# Patient Record
Sex: Male | Born: 1955 | Race: White | Hispanic: No | Smoking: Current every day smoker
Health system: Southern US, Community
[De-identification: ages and names within clinical notes are randomized; demographics above are authoritative.]

## PROBLEM LIST (undated history)

## (undated) ENCOUNTER — Emergency Department (HOSPITAL_COMMUNITY): Admission: EM | Payer: Medicare Other | Source: Home / Self Care

## (undated) DIAGNOSIS — I219 Acute myocardial infarction, unspecified: Secondary | ICD-10-CM

## (undated) DIAGNOSIS — R55 Syncope and collapse: Secondary | ICD-10-CM

## (undated) DIAGNOSIS — Q613 Polycystic kidney, unspecified: Secondary | ICD-10-CM

## (undated) DIAGNOSIS — N186 End stage renal disease: Secondary | ICD-10-CM

## (undated) DIAGNOSIS — I739 Peripheral vascular disease, unspecified: Secondary | ICD-10-CM

## (undated) DIAGNOSIS — Z87898 Personal history of other specified conditions: Secondary | ICD-10-CM

## (undated) DIAGNOSIS — F419 Anxiety disorder, unspecified: Secondary | ICD-10-CM

## (undated) DIAGNOSIS — J189 Pneumonia, unspecified organism: Secondary | ICD-10-CM

## (undated) DIAGNOSIS — I4891 Unspecified atrial fibrillation: Secondary | ICD-10-CM

## (undated) DIAGNOSIS — R253 Fasciculation: Secondary | ICD-10-CM

## (undated) DIAGNOSIS — K219 Gastro-esophageal reflux disease without esophagitis: Secondary | ICD-10-CM

## (undated) DIAGNOSIS — R4689 Other symptoms and signs involving appearance and behavior: Secondary | ICD-10-CM

## (undated) DIAGNOSIS — I2510 Atherosclerotic heart disease of native coronary artery without angina pectoris: Secondary | ICD-10-CM

## (undated) DIAGNOSIS — Z992 Dependence on renal dialysis: Secondary | ICD-10-CM

## (undated) DIAGNOSIS — K5792 Diverticulitis of intestine, part unspecified, without perforation or abscess without bleeding: Secondary | ICD-10-CM

## (undated) DIAGNOSIS — E785 Hyperlipidemia, unspecified: Secondary | ICD-10-CM

## (undated) DIAGNOSIS — J449 Chronic obstructive pulmonary disease, unspecified: Secondary | ICD-10-CM

## (undated) DIAGNOSIS — G8929 Other chronic pain: Secondary | ICD-10-CM

## (undated) DIAGNOSIS — M1990 Unspecified osteoarthritis, unspecified site: Secondary | ICD-10-CM

## (undated) DIAGNOSIS — I4719 Other supraventricular tachycardia: Secondary | ICD-10-CM

## (undated) DIAGNOSIS — B1920 Unspecified viral hepatitis C without hepatic coma: Secondary | ICD-10-CM

## (undated) DIAGNOSIS — I10 Essential (primary) hypertension: Secondary | ICD-10-CM

## (undated) DIAGNOSIS — Z87442 Personal history of urinary calculi: Secondary | ICD-10-CM

## (undated) DIAGNOSIS — I471 Supraventricular tachycardia: Secondary | ICD-10-CM

## (undated) DIAGNOSIS — M549 Dorsalgia, unspecified: Secondary | ICD-10-CM

## (undated) HISTORY — DX: Syncope and collapse: R55

## (undated) HISTORY — PX: BUNIONECTOMY: SHX129

## (undated) HISTORY — PX: BACK SURGERY: SHX140

## (undated) HISTORY — PX: POSTERIOR FUSION LUMBAR SPINE: SUR632

## (undated) HISTORY — PX: ANTERIOR CERVICAL DECOMP/DISCECTOMY FUSION: SHX1161

## (undated) HISTORY — DX: Diverticulitis of intestine, part unspecified, without perforation or abscess without bleeding: K57.92

## (undated) HISTORY — DX: Gastro-esophageal reflux disease without esophagitis: K21.9

## (undated) HISTORY — DX: Supraventricular tachycardia: I47.1

## (undated) HISTORY — DX: Other supraventricular tachycardia: I47.19

## (undated) HISTORY — DX: Polycystic kidney, unspecified: Q61.3

---
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
## 2000-10-22 ENCOUNTER — Inpatient Hospital Stay (HOSPITAL_COMMUNITY): Admission: AD | Admit: 2000-10-22 | Discharge: 2000-10-24 | Payer: Self-pay | Admitting: Cardiology

## 2000-10-24 ENCOUNTER — Encounter: Payer: Self-pay | Admitting: Cardiology

## 2006-04-14 HISTORY — PX: ESOPHAGOGASTRODUODENOSCOPY: SHX1529

## 2006-04-14 HISTORY — PX: COLONOSCOPY: SHX174

## 2006-11-12 ENCOUNTER — Ambulatory Visit: Payer: Self-pay | Admitting: Gastroenterology

## 2006-11-24 ENCOUNTER — Encounter: Payer: Self-pay | Admitting: Gastroenterology

## 2006-11-24 ENCOUNTER — Ambulatory Visit (HOSPITAL_COMMUNITY): Admission: RE | Admit: 2006-11-24 | Discharge: 2006-11-24 | Payer: Self-pay | Admitting: Gastroenterology

## 2006-11-24 ENCOUNTER — Ambulatory Visit: Payer: Self-pay | Admitting: Gastroenterology

## 2008-03-12 ENCOUNTER — Emergency Department (HOSPITAL_COMMUNITY): Admission: EM | Admit: 2008-03-12 | Discharge: 2008-03-12 | Payer: Self-pay | Admitting: Emergency Medicine

## 2008-05-01 ENCOUNTER — Emergency Department (HOSPITAL_COMMUNITY): Admission: EM | Admit: 2008-05-01 | Discharge: 2008-05-02 | Payer: Self-pay | Admitting: Emergency Medicine

## 2008-08-24 ENCOUNTER — Encounter: Payer: Self-pay | Admitting: Physician Assistant

## 2008-08-25 ENCOUNTER — Ambulatory Visit: Payer: Self-pay | Admitting: Cardiology

## 2008-08-25 ENCOUNTER — Encounter: Payer: Self-pay | Admitting: Physician Assistant

## 2008-08-25 ENCOUNTER — Inpatient Hospital Stay (HOSPITAL_COMMUNITY): Admission: EM | Admit: 2008-08-25 | Discharge: 2008-08-28 | Payer: Self-pay | Admitting: Cardiology

## 2008-08-28 ENCOUNTER — Encounter: Payer: Self-pay | Admitting: Physician Assistant

## 2008-09-29 ENCOUNTER — Encounter: Payer: Self-pay | Admitting: Physician Assistant

## 2009-04-17 ENCOUNTER — Encounter: Payer: Self-pay | Admitting: Cardiology

## 2009-09-14 ENCOUNTER — Encounter: Payer: Self-pay | Admitting: Physician Assistant

## 2009-09-15 ENCOUNTER — Encounter: Payer: Self-pay | Admitting: Physician Assistant

## 2009-09-17 ENCOUNTER — Encounter: Payer: Self-pay | Admitting: Physician Assistant

## 2009-09-17 ENCOUNTER — Ambulatory Visit: Payer: Self-pay | Admitting: Cardiology

## 2009-09-18 ENCOUNTER — Encounter: Payer: Self-pay | Admitting: Physician Assistant

## 2009-10-04 ENCOUNTER — Encounter: Payer: Self-pay | Admitting: Physician Assistant

## 2009-10-04 DIAGNOSIS — R072 Precordial pain: Secondary | ICD-10-CM

## 2009-10-04 DIAGNOSIS — F341 Dysthymic disorder: Secondary | ICD-10-CM

## 2009-10-04 DIAGNOSIS — D509 Iron deficiency anemia, unspecified: Secondary | ICD-10-CM

## 2009-10-04 DIAGNOSIS — I10 Essential (primary) hypertension: Secondary | ICD-10-CM | POA: Insufficient documentation

## 2009-10-04 DIAGNOSIS — K219 Gastro-esophageal reflux disease without esophagitis: Secondary | ICD-10-CM | POA: Insufficient documentation

## 2009-10-04 DIAGNOSIS — N179 Acute kidney failure, unspecified: Secondary | ICD-10-CM

## 2009-10-04 DIAGNOSIS — N189 Chronic kidney disease, unspecified: Secondary | ICD-10-CM

## 2009-10-10 ENCOUNTER — Encounter: Payer: Self-pay | Admitting: Cardiology

## 2009-10-27 ENCOUNTER — Encounter: Payer: Self-pay | Admitting: Cardiology

## 2010-03-03 ENCOUNTER — Emergency Department (HOSPITAL_COMMUNITY): Admission: EM | Admit: 2010-03-03 | Discharge: 2010-03-03 | Payer: Self-pay | Admitting: Emergency Medicine

## 2010-03-03 ENCOUNTER — Encounter: Payer: Self-pay | Admitting: Cardiology

## 2010-05-01 ENCOUNTER — Encounter: Payer: Self-pay | Admitting: Cardiology

## 2010-05-06 ENCOUNTER — Encounter: Payer: Self-pay | Admitting: Cardiology

## 2010-05-06 ENCOUNTER — Encounter: Payer: Self-pay | Admitting: Physician Assistant

## 2010-05-07 ENCOUNTER — Encounter: Payer: Self-pay | Admitting: Cardiology

## 2010-05-07 DIAGNOSIS — R55 Syncope and collapse: Secondary | ICD-10-CM

## 2010-05-07 HISTORY — DX: Syncope and collapse: R55

## 2010-05-08 ENCOUNTER — Encounter: Payer: Self-pay | Admitting: Cardiology

## 2010-05-13 ENCOUNTER — Telehealth (INDEPENDENT_AMBULATORY_CARE_PROVIDER_SITE_OTHER): Payer: Self-pay | Admitting: *Deleted

## 2010-05-14 NOTE — Consult Note (Signed)
Summary: CARDIOLOGY CONSULT/ [facility]/ [place]   Imported By: Delfino Lovett 10/04/2009 11:41:44  _____________________________________________________________________  External Attachment:    Type:   Image     Comment:   External Document

## 2010-05-14 NOTE — Letter (Signed)
Summary: [facility] D/C DR.MARGARET CAMPBELL  [facility] D/C DR.MARGARET CAMPBELL   Imported By: Delfino Lovett 10/04/2009 11:42:44  _____________________________________________________________________  External Attachment:    Type:   Image     Comment:   External Document

## 2010-05-14 NOTE — Cardiovascular Report (Signed)
Summary: Cardiac Catheterization  Cardiac Catheterization   Imported By: Bartholomew Boards 10/04/2009 11:43:22  _____________________________________________________________________  External Attachment:    Type:   Image     Comment:   External Document

## 2010-05-16 NOTE — Miscellaneous (Signed)
Summary: Orders Update  Clinical Lists Changes  Problems: Added new problem of SYNCOPE (ICD-780.2) Orders: Added new Referral order of Cardionet/Event Monitor (Cardionet/Event) - Signed

## 2010-05-22 DIAGNOSIS — R0789 Other chest pain: Secondary | ICD-10-CM

## 2010-05-22 NOTE — Consult Note (Signed)
Summary: Consultation Report/ CARDIOLOGY  Consultation Report/ CARDIOLOGY   Imported By: Bartholomew Boards 05/16/2010 10:21:19  _____________________________________________________________________  External Attachment:    Type:   Image     Comment:   External Document

## 2010-05-22 NOTE — Progress Notes (Signed)
Summary: Monitor  Phone Note Other Incoming Call back at [phone]   Summary of Call: Pt left message on voicemail asking for a return call regarding monitor. He wants to make sure we have his correct address.   Left message to call back on voicemail. Initial call taken by: Gurney Maxin, RN, BSN,  May 13, 2010 3:17 PM Call placed by: Gurney Maxin, RN, BSN,  May 13, 2010 3:16 PM  Follow-up for Phone Call        Pt returned call and gave address of [place]. Home number is [phone]. Follow-up by: Gurney Maxin, RN, BSN,  May 13, 2010 4:51 PM

## 2010-06-03 ENCOUNTER — Encounter: Payer: Self-pay | Admitting: Cardiology

## 2010-06-12 ENCOUNTER — Encounter: Payer: Self-pay | Admitting: Cardiology

## 2010-06-20 NOTE — Letter (Signed)
Summary: [facility] D/C DR. MOHAMMAD ANWAR  [facility] D/C DR. MOHAMMAD ANWAR   Imported By: Delfino Lovett 06/12/2010 10:19:13  _____________________________________________________________________  External Attachment:    Type:   Image     Comment:   External Document

## 2010-06-20 NOTE — Consult Note (Signed)
Summary: CARDIOLOGY CONSULT/ [facility]/ [place]   Imported By: Delfino Lovett 06/12/2010 10:10:20  _____________________________________________________________________  External Attachment:    Type:   Image     Comment:   External Document

## 2010-06-20 NOTE — Medication Information (Signed)
Summary: [facility] D/C MEDICATION SHEET  [place] D/C MEDICATION SHEET   Imported By: Delfino Lovett 06/12/2010 10:18:48  _____________________________________________________________________  External Attachment:    Type:   Image     Comment:   External Document

## 2010-06-25 LAB — COMPREHENSIVE METABOLIC PANEL
ALT: 10 U/L (ref 0–53)
Alkaline Phosphatase: 90 U/L (ref 39–117)
CO2: 27 mEq/L (ref 19–32)
Chloride: 99 mEq/L (ref 96–112)
GFR calc non Af Amer: 56 mL/min — ABNORMAL LOW (ref >60)
Glucose, Bld: 111 mg/dL — ABNORMAL HIGH (ref 70–99)
Potassium: 3.9 mEq/L (ref 3.5–5.1)
Sodium: 133 mEq/L — ABNORMAL LOW (ref 135–145)
Total Bilirubin: 0.6 mg/dL (ref 0.3–1.2)
Total Protein: 7.4 g/dL (ref 6.0–8.3)

## 2010-06-25 LAB — URINALYSIS, ROUTINE W REFLEX MICROSCOPIC
Glucose, UA: NEGATIVE mg/dL
Ketones, ur: NEGATIVE mg/dL
Leukocytes, UA: NEGATIVE
Protein, ur: 30 mg/dL — AB

## 2010-06-25 LAB — DIFFERENTIAL
Basophils Absolute: 0 10*3/uL (ref 0.0–0.1)
Basophils Relative: 0 % (ref 0–1)
Eosinophils Absolute: 0 10*3/uL (ref 0.0–0.7)
Monocytes Relative: 3 % (ref 3–12)
Neutrophils Relative %: 88 % — ABNORMAL HIGH (ref 43–77)

## 2010-06-25 LAB — CBC
HCT: 46.8 % (ref 39.0–52.0)
Hemoglobin: 15.4 g/dL (ref 13.0–17.0)
MCV: 97.9 fL (ref 78.0–100.0)
RBC: 4.78 MIL/uL (ref 4.22–5.81)
WBC: 10.1 10*3/uL (ref 4.0–10.5)

## 2010-06-30 ENCOUNTER — Emergency Department (HOSPITAL_COMMUNITY)
Admission: EM | Admit: 2010-06-30 | Discharge: 2010-06-30 | Disposition: A | Discharge status: Home / Self Care | Payer: Medicaid Other | Attending: Emergency Medicine | Admitting: Emergency Medicine

## 2010-06-30 DIAGNOSIS — G8929 Other chronic pain: Secondary | ICD-10-CM | POA: Insufficient documentation

## 2010-06-30 DIAGNOSIS — M545 Low back pain, unspecified: Secondary | ICD-10-CM | POA: Insufficient documentation

## 2010-06-30 DIAGNOSIS — Z760 Encounter for issue of repeat prescription: Secondary | ICD-10-CM | POA: Insufficient documentation

## 2010-06-30 DIAGNOSIS — I10 Essential (primary) hypertension: Secondary | ICD-10-CM | POA: Insufficient documentation

## 2010-06-30 DIAGNOSIS — R51 Headache: Secondary | ICD-10-CM | POA: Insufficient documentation

## 2010-06-30 LAB — BASIC METABOLIC PANEL
CO2: 27 mEq/L (ref 19–32)
Chloride: 101 mEq/L (ref 96–112)
GFR calc Af Amer: >60 mL/min (ref >60)
Sodium: 137 mEq/L (ref 135–145)

## 2010-07-02 NOTE — Procedures (Signed)
Summary: [facility]/  [facility]  [facility]/  [facility]   Imported By: Bartholomew Boards 06/24/2010 11:05:50  _____________________________________________________________________  External Attachment:    Type:   Image     Comment:   External Document

## 2010-07-23 LAB — BASIC METABOLIC PANEL
BUN: 12 mg/dL (ref 6–23)
BUN: 10 mg/dL (ref 6–23)
CO2: 32 mEq/L (ref 19–32)
CO2: 28 mEq/L (ref 19–32)
Chloride: 98 mEq/L (ref 96–112)
Chloride: 99 mEq/L (ref 96–112)
Creatinine, Ser: 1.52 mg/dL — ABNORMAL HIGH (ref 0.4–1.5)
Creatinine, Ser: 1.17 mg/dL (ref 0.4–1.5)
GFR calc Af Amer: >60 mL/min (ref >60)

## 2010-07-23 LAB — CBC
HCT: 42.4 % (ref 39.0–52.0)
Hemoglobin: 14.1 g/dL (ref 13.0–17.0)
MCHC: 34.2 g/dL (ref 30.0–36.0)
MCHC: 34.5 g/dL (ref 30.0–36.0)
MCHC: 33.9 g/dL (ref 30.0–36.0)
MCV: 100.3 fL — ABNORMAL HIGH (ref 78.0–100.0)
MCV: 100.5 fL — ABNORMAL HIGH (ref 78.0–100.0)
MCV: 100.8 fL — ABNORMAL HIGH (ref 78.0–100.0)
Platelets: 194 10*3/uL (ref 150–400)
RBC: 4.09 MIL/uL — ABNORMAL LOW (ref 4.22–5.81)
RBC: 4.21 MIL/uL — ABNORMAL LOW (ref 4.22–5.81)

## 2010-07-23 LAB — HEPARIN LEVEL (UNFRACTIONATED)
Heparin Unfractionated: 0.36 IU/mL (ref 0.30–0.70)
Heparin Unfractionated: 0.74 IU/mL — ABNORMAL HIGH (ref 0.30–0.70)

## 2010-08-27 NOTE — Consult Note (Signed)
Aaron [name], Aaron [name]                ACCOUNT NO.:  [id number]   MEDICAL RECORD NO.:  [id number]          PATIENT TYPE:  AMB   LOCATION:  DAY                           FACILITY:  [facility]   PHYSICIAN:  Caro Hight, M.D.      DATE OF BIRTH:  07/10/1954   DATE OF CONSULTATION:  11/12/2006  DATE OF DISCHARGE:                                 CONSULTATION   REFERRING PHYSICIAN:  Sherrilee Gilles. Gerarda Fraction, M.D. at the free clinic of  [facility], [address] [place], [place]  [address].   REASON FOR CONSULTATION:  Abdominal pain and weight loss.   HISTORY OF PRESENT ILLNESS:  [name] is a 55 year old male who states  his stomach has not been quite right since June.  In June 2008, he  presented to [facility] with right lower quadrant pain  of acute onset.  CT scan was performed which revealed left renal stones,  bladder stones, and mild diverticulitis in the distal descending colon.  He also had wall thickening which was minimal over the ascending colon  and cecum as well as the terminal ileum.  His stool culture from October 14, 2006 was negative.  He also had a white count of 7.6, hemoglobin of  16.6, BUN of 19 and creatinine 1.30.  He was discharged on Cipro and  metronidazole and symptoms improved.  He continues to have left lower  quadrant pain.  He feels run down.  He is still nauseated and  complains that he is losing weight.  He has never had an upper endoscopy  or a colonoscopy.  He vomits one to two times a week.  When he eats  stuff it gets hung up in his chest.  He feels this sensation with  solids and liquids.  He is avoiding pork and fried foods.  He is seeing  strings of blood in stool since June 2008.  He also complains of black  tarry stool approximately 1 week ago.  He is having 1 or 2 bowel  movements a day which are soft or watery.  He denies any constipation or  pain with swallowing.  His heartburn and indigestion are controlled with  Zantac  twice a day if he takes it before he eats.   PAST MEDICAL HISTORY.:  1. Polycystic kidney disease.  2. Hypertension.  3. Emphysema.  4. Depression.   PAST SURGICAL HISTORY:  Bunionectomy.   ALLERGIES:  CODEINE.   MEDICATIONS:  1. Trazodone 100 mg nightly.  2. Dicyclomine 10 mg three times a day.  3. Citalopram 20 mg daily.  4. Triamterene/hydrochlorothiazide 37.5/25 daily.  5. Hydrocodone 5/500 every 4 hours as needed.  6. Atenolol 50 mg daily.  7. Ranitidine 150 mg b.i.d.  8. Combivent three times a day.   FAMILY HISTORY:  His grandfather had colon cancer in his 51s.  He had  two cousins with kidney cancer.  His sisters had male cancer.   SOCIAL HISTORY:  He is separated.  He is a [employer] manager.  He smokes but does  not drink alcohol or use any  recreational drugs.   REVIEW OF SYSTEMS:  As per the HPI, otherwise all systems are negative.   PHYSICAL EXAMINATION:  Weight 169 pounds, height 6 feet 4 inches.  BMI  20.6 (healthy).  Temperature 98, blood pressure 130/90, pulse 56.  GENERAL: He is in no apparent distress, alert and orient x4.  HEENT:  Atraumatic, normocephalic.  Pupils equal and reactive to light.  Mouth:  No oral lesions.  Posterior pharynx without erythema or exudate.  NECK:  Full range of motion and no lymphadenopathy.  LUNGS:  Clear to auscultation bilaterally.  CARDIOVASCULAR:  Regular rhythm.  No murmur.  Normal S1 and S2.  ABDOMEN: Bowel sounds are present, soft, nondistended, mild tenderness  to palpation in the left lower quadrant without rebound or guarding.  NEURO:  He has no focal neurologic deficits.   ASSESSMENT:  [name] is a 55 year old male who presented with his first  episode of diverticulitis.  He continues to complain of blood in the  stool and pain in his left lower quadrant.  The differential diagnosis  includes resolved diverticulitis with functional gut disorder and colon  polyp, or colon cancer.   He also has a history of  gastroesophageal reflux disease which he states  is controlled.  He is having difficulty swallowing.  Differential  diagnosis includes peptic stricture, secondary esophageal motility  disorder due to silent acid exposure in the esophagus, and a low  likelihood of esophageal malignancy or gastric malignancy.   Thank you for allowing me to see [name] in consultation.  My  recommendations follow.   RECOMMENDATIONS:  1. [name] will be scheduled for a colonoscopy to evaluate his      descending colon, ascending colon and cecum.  He is given a      NuLYTELY bowel prep.  He also will have an upper endoscopy      performed on that date to evaluate for peptic stricture or upper      gastrointestinal malignancy.  2. He should continue to take Zantac twice daily for his      gastroesophageal reflux disease.  3. He will follow up with me as needed.      Caro Hight, M.D.  Electronically Signed     SM/MEDQ  D:  11/12/2006  T:  11/13/2006  Job:  [id number]

## 2010-08-27 NOTE — Discharge Summary (Signed)
Aaron [name], Aaron [name]                ACCOUNT NO.:  [id number]   MEDICAL RECORD NO.:  [id number]          PATIENT TYPE:  INP   LOCATION:  2021                         FACILITY:  [facility]   PHYSICIAN:  Bruce R. Olevia Perches, MD, FACCDATE OF BIRTH:  07/10/1954   DATE OF ADMISSION:  08/25/2008  DATE OF DISCHARGE:  08/28/2008                               DISCHARGE SUMMARY   ADDENDUM   PRIMARY CARE PHYSICIAN:  Herbie Baltimore Day, MD   The eGFR was equal to 48 and not 58 as previously recorded and please  note the patient has another appointment for a lab draw on Sep 06, 2008,  at the [facility], secondary to increasing creatinine.  Also, the  patient has an appointment with Dr. Herbie Baltimore Day on September 28, 2008.      Guss Bunde, PAC      Bruce R. Olevia Perches, MD, [facility]  Electronically Signed    MS/MEDQ  D:  08/28/2008  T:  08/29/2008  Job:  [id number]   cc:   Genevie Ann, MD  Ernestine Mcmurray, MD,FACC

## 2010-08-27 NOTE — Op Note (Signed)
Aaron [name], Aaron [name]                ACCOUNT NO.:  [id number]   MEDICAL RECORD NO.:  [id number]          PATIENT TYPE:  AMB   LOCATION:  DAY                           FACILITY:  [facility]   PHYSICIAN:  Caro Hight, M.D.      DATE OF BIRTH:  07/10/1954   DATE OF PROCEDURE:  11/24/2006  DATE OF DISCHARGE:                               OPERATIVE REPORT   PROCEDURE:  1. Colonoscopy.  2. Esophagogastroduodenoscopy with cold forceps biopsy.   INDICATION FOR EXAM:  Mr. Hams is a 55 year old male who presents with  abdominal pain in the right lower quadrant.  He had a CT scan which  showed minimal thickening of the ascending colon, cecum as well as  terminal ileum.  He was treated with Cipro and metronidazole.  He is  also complaining of vomiting and difficulty swallowing.  He complained  of black tarry stools well.   FINDINGS:  1. Rare sigmoid colon diverticulosis.  Internal hemorrhoids.      Otherwise no polyps, masses, inflammatory changes or arteriovenous      malformations seen.  2. Normal esophagus without evidence of Barrett's erosions,      ulcerations or strictures.  3. Multiple antral erosions and a single superficial ulceration seen      in the antrum.  Biopsies obtained via cold forceps to evaluate for      H pylori gastritis.  4. Erosions and ulcerations seen in the duodenal bulb at the junction      of D1 and D2 as well as into D2.   DIAGNOSIS:  1. Antritis and duodenitis  2. Mild diverticulosis   RECOMMENDATIONS:  1. He should follow high fiber diet and avoid gastric irritants.  He      is given handout on high-fiber diet, diverticulosis, hemorrhoids      and gastric irritants as well as gastritis.  2. No aspirin or NSAIDs for at least 3 months.  3. Screening colonoscopy in 10 years with Propofol.  4. He should stop the Zantac and begin Prilosec over-the-counter      daily.  5. Will call Mr. Mazzola with results of his biopsies.   MEDICATIONS:  1. Demerol 175 mg IV.  2.  Versed 10 mg IV.  3. Phenergan 25 mg IV.   PROCEDURE TECHNIQUE:  Physical exam was performed.  Informed consent was  obtained from the patient after explaining benefits, risks and  alternatives to the procedure.  The patient connected to monitor and  placed in left lateral position.  Continuous oxygen was provided by  nasal cannula and IV medicine administered through an indwelling  cannula.  After administration of sedation, and rectal exam, the  patient's rectum intubated.  Scope was advanced under direct  visualization to the cecum.  The scope was removed slowly by carefully  examining the color, texture, anatomy and integrity of mucosa on the way  out.   After the colonoscopy, the patient's esophagus was intubated with  diagnostic gastroscope and advanced under direct visualization to the  second portion of duodenum.  The scope was removed slowly by carefully  examining the color, texture, anatomy and integrity of mucosa on the way  out.  The patient was recovered in endoscopy and discharged home in  satisfactory condition.      Caro Hight, M.D.  Electronically Signed     SM/MEDQ  D:  11/24/2006  T:  11/25/2006  Job:  [id number]   cc:   Sherrilee Gilles. Gerarda Fraction, MD  Fax: [phone]

## 2010-08-27 NOTE — Discharge Summary (Signed)
Aaron [name], Aaron [name]                ACCOUNT NO.:  [id number]   MEDICAL RECORD NO.:  [id number]          PATIENT TYPE:  INP   LOCATION:  2021                         FACILITY:  [facility]   PHYSICIAN:  Bruce R. Olevia Perches, MD, FACCDATE OF BIRTH:  07/10/1954   DATE OF ADMISSION:  08/25/2008  DATE OF DISCHARGE:  08/28/2008                               DISCHARGE SUMMARY   PRIMARY CARDIOLOGIST:  Ernestine Mcmurray, MD, [facility]   DISCHARGE DIAGNOSES:  1. Noncardiac chest pain.  2. Labile hypertension.  3. Renal insufficiency (glomerular filtration rate equal to 58 on date      of discharge).   SECONDARY DIAGNOSES:  1. Chronic obstructive pulmonary disease.  2. Dyslipidemia.  3. Polycystic kidney disease.  4. History of polysubstance abuse (cocaine, heroine, and alcohol).  5. Gastroesophageal reflux disease.  6. Ongoing tobacco abuse disorder (2-3 packs per day).   ALLERGIES:  CODEINE.   PROCEDURES PERFORMED DURING THIS HOSPITALIZATION:  1. EKG performed on Aug 26, 2008, showing no acute ST-T wave changes.      No significant Q waves, delayed R wave progression with minimal R      waves in V3 and V4, slight left axis deviation, left ventricular      hypertrophy, PR 150, QRS 100, and QTc 432.  2. Cardiac catheterization performed on Aug 28, 2008, showing,      a.     Minimal nonobstructive coronary artery disease with 30%       proximal left anterior descending coronary artery stenosis and       normal right coronary artery and circumflex arteries.      b.     Normal left ventricular function by echo.   HISTORY OF PRESENT ILLNESS:  Aaron [name] is a 54 year old male with a  history of nonobstructive CAD by previous cath in 2002 who presented to  the ED at [facility] with complaint of chest pain.  He ruled out  with serial cardiac enzymes within normal limits, but was referred to  Aaron [name] for further eval.   Aaron [name] has numerous cardiac risk factors including a strong family  history,  hypertension, ongoing tobacco abuse disorder, and dyslipidemia.  He also reports having run out of his medication approximately 1 week  prior to his presentation to [facility].  He has never followed  up with [facility] in [place].   Previously referred to our team for cardiac cath in 2002, which yielded  a noncritical CAD, but with 80% ostial lesion, a very small first  diagonal which was not suitable for a PCI.  LV function was normal.   Aaron [name] reports symptom suggestive of exertional angina pectoris over  the past several months, relieved with rest.  However, he also reports  intermittent chest discomfort which is clearly related to social  stressors which has been quite significant over the recent past.  Nevertheless, he also refers a singular episode of severe nocturnal  angina which awoke him at 2:30 a.m., 2 nights ago.  This was a worse  episode to date, and was associated with shortness of  breath,  diaphoresis, and nausea.  There was also radiation to the left side of  the neck and down the left arm.  He does not recall how long this  episode lasted, but he decided not to seek medical attention for that  particular episode.  However, on the evening of Aug 24, 2008, he was  compelled to seek medical attention in the setting of recurrent anginal  symptoms.  He was transferred to [facility] by EMS and had a BP reading of  162/121.  He was treated with aspirin, 0.2 mg of clonidine, Nitropaste,  and placed on IV heparin.   His symptoms subsequently subsided and he has not had any recurrent  angina.  As noted, serial cardiac markers have been within normal  limits.  EKG indicates normal sinus rhythm with no acute changes.   HOSPITAL COURSE:  The patient was transferred from [facility] to  [facility] and underwent procedures as described above.  He  tolerated them well without any significant complications.  His blood  pressure was noted to be labile ranging  from within normal limits to  185/121 in the late evening of Aug 26, 2008.  He was restarted on his  beta-blocker when wheezing had diminished, however, at a lower dose of  25 mg of metoprolol p.o. b.i.d.  His lisinopril was increased to 20 mg  p.o. daily and he will be discharged on these meds as well as being  restarted on his hydrochlorothiazide at 25 mg p.o. daily.  Note, the  patient's blood pressure is significantly improved over the last 2 days  of admission except for 1 value of 156/109.  Most current vital signs on  the date of discharge, temp 97.8 degrees Fahrenheit, BP 122/83, pulse  57, respiration rate 20, and O2 saturation 99% on 2 L by nasal cannula.  Also, the patient will be started on low-dose aspirin and low-dose  statin given his nonobstructive CAD and previous diagnosis of  dyslipidemia.   The patient will be given his new medication list, prescriptions, and  followup instructions to see Aaron [name] in approximately 2 weeks and for  cath instructions at the time of discharge.  He should have no questions  or concerns that are not addressed at that time.   DISCHARGE LABORATORY DATA:  WBC 7.3, HGB 13.4, HCT 38.9, PLT count 194,  INR is 0.8, and PT 11.4.  Sodium 138, potassium 4.0, chloride 98, CO2 of  32, BUN 12, creatinine 1.52 (from 1.17 on Aug 26, 2008), and glucose 99.   FOLLOWUP PLANS AND APPOINTMENTS:  Aaron [name] on September 15, 2008, at 8:30  a.m.   DISCHARGE MEDICATIONS:  1. Enteric-coated aspirin 81 mg p.o. daily.  2. Lisinopril 40 mg p.o. daily.  3. Hydrochlorothiazide 25 mg p.o. daily.  4. Metoprolol 25 mg p.o. daily.  5. Simvastatin 20 mg p.o. daily.  6. Amitriptyline 50 mg p.o. daily.  7. Omeprazole 20 mg p.o. daily.  8. Lortab 5/500 mg p.r.n.  9. Spiriva daily.   DURATION OF DISCHARGE/ENCOUNTER:  Including physician time was 45  minutes.      Guss Bunde, PAC      Bruce R. Olevia Perches, MD, [facility]  Electronically Signed    MS/MEDQ  D:   08/28/2008  T:  08/29/2008  Job:  [id number]   cc:   Ernestine Mcmurray, MD,FACC

## 2010-08-30 NOTE — Cardiovascular Report (Signed)
[place]  Patient:    Aaron [name], Aaron [name]                       MRN: [id number] Proc. Date: 10/23/00 Adm. Date:  [id number] Attending:  Fatima Sanger CC:         Carlena Bjornstad, M.D. [facility]  Arlan Organ, M.D.  Cardiac Catheterization Lab   Cardiac Catheterization  INDICATIONS:  The patient is a 55 year old white male who presents with recurrent episodes of substernal chest pain.  The current study is done to assess coronary anatomy.  He had been seen in consultation by Dr. Ron Parker and transferred for further evaluation.  PROCEDURES: 1. Left heart catheterization. 2. Selective coronary arteriography. 3. Selective left ventriculography.  DESCRIPTION OF PROCEDURE:  The procedure was performed from the right femoral artery using 6-French catheters.  He tolerated the procedure without complication.  He was taken to the holding area in satisfactory clinical condition.  HEMODYNAMIC DATA:  The central aortic pressure was 137/78, LV pressure 142/7. There was no gradient on pullback across the aortic valve.  ANGIOGRAPHIC DATA: 1. Left ventriculography was performed in the RAO projection.  Overall global    systolic function was normal.  No significant mitral regurgitation was    noted.  The aortic leaflets appeared to move normally.  Ejection fraction    was calculated at 59.9%. 2. The left main coronary artery demonstrates minor calcification of its    distal-most aspect but without significant focal narrowing.  This was    otherwise free of significant disease. 3. The LAD coursed to the apex.  It was a fairly large caliber vessel    providing multiple septal perforators.  Importantly, there was a minuscule    diagonal branch that appeared to have about 80% proximal narrowing.  This    vessel was not suitable for percutaneous intervention by its very small    size, being 1 mm or less. 4. There was an extremely large ramus intermedius that  bifurcated twice.    There were multiple sub branches related to this vessel, and the    intermedius branch was free of critical disease. 5. There was an AV circumflex that was small and was free of disease. 6. The right coronary artery was a large vessel providing a large acute    marginal branch, a moderate-sized posterior descending, and moderate-sized    posterolateral branch.  The right coronary artery was free of critical    disease.  CONCLUSIONS: 1. Normal left ventricular function. 2. Minuscule diagonal branch of insignificant importance with ostial disease. 3. No other critical coronary lesions noted.  DISPOSITION:  We will obtain a spiral CT to complete the workup.  Follow-up will be with Dr. Ron Parker.  Discontinuation of smoking would be recommended. DD:  10/23/00 TD:  10/23/00 Job: [id number]

## 2010-08-30 NOTE — Discharge Summary (Signed)
[facility]  Patient:    Aaron [name], Aaron [name]                       MRN: [id number] Adm. Date:  [id number] Disc. Date: [id number] Attending:  Fatima Sanger Dictator:   Sharyl Nimrod, P.A.-C. CC:         Terald Sleeper, M.D. - [facility], [address], [place], [place] [address]             Cleda Mccreedy, M.D. - [place], [place]   Discharge Summary  DATE OF BIRTH:  July 10, 1954  HISTORY OF PRESENT ILLNESS:  Aaron [name] is a 55 year old white male who was recently diagnosed with hypertension and hyperlipidemia, and has a history of early family history of coronary artery disease.  He is referred with a two-day history of chest discomfort of sudden onset, described as sharp and knife-like in his left chest, radiating into his neck and shoulder, lasting for five to 10 minutes.  The discomfort would be exacerbated by work, relieved with rest, associated with shortness of breath and diaphoresis.  His electrocardiogram did not show any acute changes.  PAST MEDICAL HISTORY:  His history is also notable for tobacco use.  LABORATORY DATA:  At [facility] sodium was 138, potassium 3.8, BUN 15, creatinine 1.0.  CPK and troponin negative for a myocardial infarction.  H&H 16.8 and 46.9.  Normal indices.  Platelets 224, wbcs 10.3.  TSH 0.69.  An echocardiogram here at [facility] showed an ejection fraction of 55%-65%, mild left ventricular hypertrophy, mild aortic valve calcification, mild aortic stenosis, with mild AI, mild mitral regurgitation, left atrial dilatation.  HOSPITAL COURSE:  Aaron [name] was transferred to [facility] for a cardiac catheterization.  On October 23, 2000, Dr. Loretha Brasil. Stuckey performed a cardiac catheterization.  According to his progress notes, his aortic pressure was 137/78, LV pressure 142/77.  His ejection fraction was 59.9%, without wall motion abnormalities.  There are some minor calcifications  at the distal left main.  He had a very miniscule diagonal-I which had an 80% ostial lesion.  Dr. Lia Foyer felt that this miniscule vessel was not the etiology of his chest discomfort.  DISPOSITION:  On the morning of October 24, 2000, a CT scan was performed that did not show pulmonary embolism, thus Dr. Elta Guadeloupe Pulsipher after reviewing, felt that he could be discharged home.  DISCHARGE DIAGNOSES: 1. Atypical chest discomfort, of unknown etiology. 2. History as previously.  DISCHARGE MEDICATIONS: 1. He received new medications including coated aspirin 325 mg q.d. 2. Altace 5 mg q.d. 3. He is asked to resume his Lipitor 20 mg q.h.s. 4. Effexor 37.5 mg q.d. 5. Hydroxyzine 25 mg b.i.d. 6. Verelan 200 mg q.h.s. 7. Protonix 40 mg q.d.  INSTRUCTIONS:  He is advised no lifting, driving, sexual activity, or heavy exertion for two days.  If he has any problems with his catheterization site he is asked to call us immediately.  He was advised to discontinue smoking and tobacco products.  DIET:  He is to maintain a low-fat, low-salt, low-cholesterol diet.  FOLLOWUP:  He is to arrange a one to two-week appointment with Dr. Cleda Mccreedy. DD:  10/24/00 TD:  10/24/00 Job: [id number]

## 2010-09-17 ENCOUNTER — Encounter: Payer: Self-pay | Admitting: Cardiology

## 2010-09-20 ENCOUNTER — Encounter: Payer: Self-pay | Admitting: Cardiology

## 2010-09-23 ENCOUNTER — Encounter: Payer: Self-pay | Admitting: Cardiology

## 2010-09-24 ENCOUNTER — Encounter: Payer: Self-pay | Admitting: Cardiology

## 2010-09-25 ENCOUNTER — Encounter: Payer: Self-pay | Admitting: Cardiology

## 2010-10-23 ENCOUNTER — Encounter: Payer: Self-pay | Admitting: Cardiology

## 2010-10-28 ENCOUNTER — Encounter: Payer: Self-pay | Admitting: Cardiology

## 2010-10-28 ENCOUNTER — Ambulatory Visit (INDEPENDENT_AMBULATORY_CARE_PROVIDER_SITE_OTHER): Payer: Medicare Other | Admitting: Cardiology

## 2010-10-28 VITALS — BP 121/83 | HR 83 | Ht 76.0 in | Wt 196.0 lb

## 2010-10-28 DIAGNOSIS — Z720 Tobacco use: Secondary | ICD-10-CM

## 2010-10-28 DIAGNOSIS — Z01810 Encounter for preprocedural cardiovascular examination: Secondary | ICD-10-CM

## 2010-10-28 DIAGNOSIS — F17200 Nicotine dependence, unspecified, uncomplicated: Secondary | ICD-10-CM

## 2010-10-28 DIAGNOSIS — I10 Essential (primary) hypertension: Secondary | ICD-10-CM

## 2010-10-28 DIAGNOSIS — R072 Precordial pain: Secondary | ICD-10-CM

## 2010-10-28 DIAGNOSIS — R55 Syncope and collapse: Secondary | ICD-10-CM

## 2010-10-28 MED ORDER — NITROGLYCERIN 0.4 MG SL SUBL: 0.4000 mg | SUBLINGUAL_TABLET | SUBLINGUAL | Status: DC | PRN

## 2010-10-28 NOTE — Progress Notes (Signed)
HPI The patient presents for evaluation after a hospitalization earlier this year. We saw him in consultation at that time as there was a history of syncope. The etiology of this was not clear though there were multiple issues such as dehydration going on at that time. I have reviewed his records. He did wear an outpatient event monitor after this. Since then he has had no further syncopal episodes though he will occasionally get dizzy. He does report some rare palpitations. Sometimes these wake him at night. However, this has been a stable pattern. He also describes occasional fleeting chest discomfort. However, he reports that this is unchanged since 2007. I reviewed his last catheterization in 2002 demonstrated minimal nonobstructive disease of the LAD 25% stenosis. Normal his symptoms have changed since that time. He denies any new shortness of breath, PND or orthopnea. He has had no weight gain or edema. He does remain active. Apparently he is to have neck surgery upcoming.  Allergies  Allergen Reactions  . Codeine Itching and Nausea Only    Current Outpatient Prescriptions  Medication Sig Dispense Refill  . albuterol (PROVENTIL HFA) 108 (90 BASE) MCG/ACT inhaler Inhale 2 puffs into the lungs every 6 (six) hours as needed.        Marland Kitchen aspirin 81 MG tablet Take 81 mg by mouth daily.        . budesonide-formoterol (SYMBICORT) 80-4.5 MCG/ACT inhaler Inhale 2 puffs into the lungs 2 (two) times daily.        Marland Kitchen lisinopril-hydrochlorothiazide (PRINZIDE,ZESTORETIC) 20-12.5 MG per tablet Take 1 tablet by mouth daily.        Marland Kitchen omeprazole (PRILOSEC) 20 MG capsule Take 20 mg by mouth daily.        Marland Kitchen oxyCODONE-acetaminophen (PERCOCET) 10-325 MG per tablet Take 1 tablet by mouth every 8 (eight) hours as needed.        . tiotropium (SPIRIVA) 18 MCG inhalation capsule Place 18 mcg into inhaler and inhale daily.          Past Medical History  Diagnosis Date  . Other and unspecified hyperlipidemia   .  Unspecified cardiovascular disease   . Unspecified essential hypertension   . Polycystic kidney, unspecified type   . Chronic airway obstruction, not elsewhere classified   . Unspecified viral hepatitis C without hepatic coma   . Esophageal reflux   . Other, mixed, or unspecified nondependent drug abuse, unspecified   . Calculus of kidney   . Anxiety state, unspecified   . Migraine, unspecified, without mention of intractable migraine without mention of status migrainosus   . Backache, unspecified   . Other B-complex deficiencies   . Other malaise and fatigue   . Diverticulitis     Past Surgical History  Procedure Date  . Back surgery   . Bunionectomy   . Cardiac catheterization     ROS:  As stated in the HPI and negative for all other systems.  PHYSICAL EXAM BP 121/83  Pulse 83  Ht 6\' 4"  (1.93 m)  Wt 196 lb (88.905 kg)  BMI 23.86 kg/m2 GENERAL:  Well appearing HEENT:  Pupils equal round and reactive, fundi not visualized, oral mucosa unremarkable, edentulous NECK:  No jugular venous distention, waveform within normal limits, carotid upstroke brisk and symmetric, no bruits, no thyromegaly LYMPHATICS:  No cervical, inguinal adenopathy LUNGS:  Clear to auscultation bilaterally BACK:  No CVA tenderness CHEST:  Unremarkable HEART:  PMI not displaced or sustained,S1 and S2 within normal limits, no S3, no S4, no  clicks, no rubs, no murmurs ABD:  Flat, positive bowel sounds normal in frequency in pitch, no bruits, no rebound, no guarding, no midline pulsatile mass, no hepatomegaly, no splenomegaly EXT:  2 plus pulses throughout, no edema, no cyanosis no clubbing SKIN:  No rashes no nodules NEURO:  Cranial nerves II through XII grossly intact, motor grossly intact throughout [facility]:  Cognitively intact, oriented to person place and time   EKG:  08/21/10 Sinus rhythm 54, rate, axis within normal limits, intervals within normal limits, no acute ST-T wave changes, poor anterior R wave  progression   ASSESSMENT AND PLAN

## 2010-10-28 NOTE — Assessment & Plan Note (Signed)
He has had no further episodes.  No further work up is indicated.

## 2010-10-28 NOTE — Patient Instructions (Signed)
   Follow up as needed.  Nitroglycerin 0.4 mg as needed.

## 2010-10-28 NOTE — Assessment & Plan Note (Signed)
The blood pressure is at target. No change in medications is indicated. We will continue with therapeutic lifestyle changes (TLC).  

## 2010-10-28 NOTE — Assessment & Plan Note (Signed)
His chest pain is atypical and stable since cath in 2010.  He will continue with risk reduction.

## 2010-10-28 NOTE — Assessment & Plan Note (Signed)
He continues to be educated about the need to stop smoking.  I am not hopeful that he will ever quit.

## 2010-10-28 NOTE — Assessment & Plan Note (Signed)
He has no new symptoms and minimal plaque on recent cath.  Therefore, based on ACC/AHA guidelines, the patient would be at acceptable risk for the planned procedure without further cardiovascular testing.

## 2011-03-10 DIAGNOSIS — R072 Precordial pain: Secondary | ICD-10-CM

## 2011-03-10 DIAGNOSIS — R079 Chest pain, unspecified: Secondary | ICD-10-CM

## 2014-08-18 ENCOUNTER — Other Ambulatory Visit: Payer: Self-pay | Admitting: *Deleted

## 2014-08-18 DIAGNOSIS — M79605 Pain in left leg: Secondary | ICD-10-CM

## 2014-09-13 DIAGNOSIS — R55 Syncope and collapse: Secondary | ICD-10-CM

## 2014-09-13 HISTORY — DX: Syncope and collapse: R55

## 2014-09-18 ENCOUNTER — Encounter: Payer: Self-pay | Admitting: Vascular Surgery

## 2014-09-19 ENCOUNTER — Ambulatory Visit (INDEPENDENT_AMBULATORY_CARE_PROVIDER_SITE_OTHER): Payer: Medicare Other | Admitting: Vascular Surgery

## 2014-09-19 ENCOUNTER — Encounter: Payer: Self-pay | Admitting: Vascular Surgery

## 2014-09-19 ENCOUNTER — Other Ambulatory Visit: Payer: Self-pay

## 2014-09-19 ENCOUNTER — Ambulatory Visit (HOSPITAL_COMMUNITY)
Admission: RE | Admit: 2014-09-19 | Discharge: 2014-09-19 | Disposition: A | Discharge status: Home / Self Care | Payer: Medicare Other | Source: Ambulatory Visit | Attending: Vascular Surgery | Admitting: Vascular Surgery

## 2014-09-19 VITALS — BP 157/109 | HR 70 | Ht 76.0 in | Wt 202.1 lb

## 2014-09-19 DIAGNOSIS — I739 Peripheral vascular disease, unspecified: Secondary | ICD-10-CM | POA: Diagnosis not present

## 2014-09-19 DIAGNOSIS — I70202 Unspecified atherosclerosis of native arteries of extremities, left leg: Secondary | ICD-10-CM | POA: Insufficient documentation

## 2014-09-19 DIAGNOSIS — M79605 Pain in left leg: Secondary | ICD-10-CM | POA: Diagnosis not present

## 2014-09-19 NOTE — Progress Notes (Signed)
Subjective:     Patient ID: Aaron [name], male   DOB: 02-11-1956, 59 y.o.   MRN: [id number]  HPI this 59 year old male developed left leg cramping particularly in the calf area and March of this year. This came on rather gradually. He now has severe claudication type symptoms which limit him to walking about 100 yards. At that point he becomes very numb in the left calf and foot area. He denies any history of infection cellulitis gangrene or rest pain. He has no symptoms the contralateral right leg. He has a long history of tobacco abuse 1-1/2-2 packs per day for 40+ years but states he did quit about a month ago.  Past Medical History  Diagnosis Date  . Other and unspecified hyperlipidemia   . Unspecified cardiovascular disease   . Unspecified essential hypertension   . Polycystic kidney, unspecified type   . Chronic airway obstruction, not elsewhere classified   . Unspecified viral hepatitis C without hepatic coma   . Esophageal reflux   . Other, mixed, or unspecified nondependent drug abuse, unspecified   . Calculus of kidney   . Anxiety state, unspecified   . Migraine, unspecified, without mention of intractable migraine without mention of status migrainosus   . Backache, unspecified   . Other B-complex deficiencies   . Other malaise and fatigue   . Diverticulitis     History  Substance Use Topics  . Smoking status: Former Smoker -- 1.00 packs/day for 47 years    Types: Cigarettes    Quit date: 08/13/2014  . Smokeless tobacco: Never Used  . Alcohol Use: No    Family History  Problem Relation Age of Onset  . Coronary artery disease      family history  . Cancer Maternal Grandfather 59     colon cancer  . Cancer Cousin      kidney cancer  . Cancer Sister      male cancer    Allergies  Allergen Reactions  . Codeine Itching and Nausea Only     Current outpatient prescriptions:  .  albuterol (PROVENTIL HFA) 108 (90 BASE) MCG/ACT inhaler, Inhale 2 puffs into the  lungs every 6 (six) hours as needed.  , Disp: , Rfl:  .  amitriptyline (ELAVIL) 10 MG tablet, Take 10 mg by mouth at bedtime., Disp: , Rfl:  .  amLODipine (NORVASC) 10 MG tablet, Take 10 mg by mouth daily. , Disp: , Rfl:  .  aspirin 81 MG tablet, Take 81 mg by mouth daily.  , Disp: , Rfl:  .  baclofen (LIORESAL) 10 MG tablet, Take 10 mg by mouth. , Disp: , Rfl:  .  cetirizine (ZYRTEC) 10 MG tablet, Take 10 mg by mouth daily., Disp: , Rfl:  .  lisinopril (PRINIVIL,ZESTRIL) 20 MG tablet, Take 20 mg by mouth daily. , Disp: , Rfl:  .  omeprazole (PRILOSEC) 20 MG capsule, Take 20 mg by mouth daily.  , Disp: , Rfl:  .  oxyCODONE-acetaminophen (PERCOCET) 10-325 MG per tablet, Take 1 tablet by mouth every 8 (eight) hours as needed.  , Disp: , Rfl:  .  tiotropium (SPIRIVA) 18 MCG inhalation capsule, Place 18 mcg into inhaler and inhale daily.  , Disp: , Rfl:  .  budesonide-formoterol (SYMBICORT) 80-4.5 MCG/ACT inhaler, Inhale 2 puffs into the lungs 2 (two) times daily.  , Disp: , Rfl:  .  lisinopril-hydrochlorothiazide (PRINZIDE,ZESTORETIC) 20-12.5 MG per tablet, Take 1 tablet by mouth daily.  , Disp: , Rfl:  .  nitroGLYCERIN (NITROSTAT) 0.4 MG SL tablet, Place 1 tablet (0.4 mg total) under the tongue every 5 (five) minutes as needed for chest pain., Disp: 25 tablet, Rfl: 3  Filed Vitals:   09/19/14 1116  BP: 157/109  Pulse: 70  Height: 6\' 4"  (1.93 m)  Weight: 202 lb 1.6 oz (91.672 kg)  SpO2: 98%    Body mass index is 24.61 kg/(m^2).           Review of Systems denies chest pain, dyspnea on exertion, PND, orthopnea, hemoptysis. Patient does have symptoms of asthma and wheezing on occasion. Heavy history of tobacco abuse as noted in history of present illness. All other systems negative and complete review of systems     Objective:   Physical Exam BP 157/109 mmHg  Pulse 70  Ht 6\' 4"  (1.93 m)  Wt 202 lb 1.6 oz (91.672 kg)  BMI 24.61 kg/m2  SpO2 98%  Gen.-alert and oriented x3 in no  apparent distress HEENT normal for age Lungs no rhonchi or wheezing Cardiovascular regular rhythm no murmurs carotid pulses 3+ palpable no bruits audible Abdomen soft nontender no palpable masses Musculoskeletal free of  major deformities Skin clear -no rashes Neurologic normal Lower extremities 3+ femoral and dorsalis pedis and posterior tibial pulse palpable on the right Left leg with 3+ femoral absent popliteal and distal pulses. Left foot with slight decreased sensation compared to right. No active infection ulceration or gangrene noted.  Today I ordered lower extremity duplex scan and ABIs which I reviewed and interpreted. ABI on the left foot is 0.56 compared to 1.0 on the right. Duplex scan reveals possible moderate stenosis of the left common femoral artery and possible total occlusion of the left SFA in the mid thigh.       Assessment:     #1 PAD with likely total occlusion left superficial femoral artery with limiting claudication for past 2-1/2 months Possible moderate stenosis left common femoral artery #2 tobacco abuse-1-1/2-2 packs per day for 40+ years but patient states he recently quit smoking #3 history of hypertension controlled on medication    Plan:     Plan aortobifemoral angiography with possible PTA and stenting left superficial femoral artery if feasible and appropriate If patient has significant left common femoral occlusive disease may require endarterectomy and patch angioplasty-to be determined by angiographic findings His scheduled angiogram for Dr. Trula Slade tomorrow June 8 and decision regarding appropriate treatment will then be made

## 2014-09-20 ENCOUNTER — Encounter (HOSPITAL_COMMUNITY): Payer: Self-pay | Admitting: Surgery

## 2014-09-20 ENCOUNTER — Other Ambulatory Visit: Payer: Self-pay | Admitting: *Deleted

## 2014-09-20 ENCOUNTER — Encounter (HOSPITAL_COMMUNITY)
Admission: RE | Disposition: A | Discharge status: Home / Self Care | Payer: Medicare Other | Source: Ambulatory Visit | Attending: Surgery

## 2014-09-20 ENCOUNTER — Ambulatory Visit (HOSPITAL_COMMUNITY)
Admission: RE | Admit: 2014-09-20 | Discharge: 2014-09-20 | Disposition: A | Discharge status: Home / Self Care | Payer: Medicare Other | Source: Ambulatory Visit | Attending: Surgery | Admitting: Surgery

## 2014-09-20 DIAGNOSIS — I129 Hypertensive chronic kidney disease with stage 1 through stage 4 chronic kidney disease, or unspecified chronic kidney disease: Secondary | ICD-10-CM | POA: Diagnosis present

## 2014-09-20 DIAGNOSIS — R112 Nausea with vomiting, unspecified: Secondary | ICD-10-CM | POA: Diagnosis present

## 2014-09-20 DIAGNOSIS — Z87891 Personal history of nicotine dependence: Secondary | ICD-10-CM

## 2014-09-20 DIAGNOSIS — Z79891 Long term (current) use of opiate analgesic: Secondary | ICD-10-CM | POA: Insufficient documentation

## 2014-09-20 DIAGNOSIS — I10 Essential (primary) hypertension: Secondary | ICD-10-CM

## 2014-09-20 DIAGNOSIS — I493 Ventricular premature depolarization: Secondary | ICD-10-CM | POA: Diagnosis present

## 2014-09-20 DIAGNOSIS — E860 Dehydration: Secondary | ICD-10-CM | POA: Diagnosis present

## 2014-09-20 DIAGNOSIS — E539 Vitamin B deficiency, unspecified: Secondary | ICD-10-CM | POA: Insufficient documentation

## 2014-09-20 DIAGNOSIS — G43909 Migraine, unspecified, not intractable, without status migrainosus: Secondary | ICD-10-CM | POA: Insufficient documentation

## 2014-09-20 DIAGNOSIS — F411 Generalized anxiety disorder: Secondary | ICD-10-CM | POA: Diagnosis present

## 2014-09-20 DIAGNOSIS — R55 Syncope and collapse: Secondary | ICD-10-CM | POA: Diagnosis not present

## 2014-09-20 DIAGNOSIS — F341 Dysthymic disorder: Secondary | ICD-10-CM | POA: Diagnosis present

## 2014-09-20 DIAGNOSIS — I472 Ventricular tachycardia: Secondary | ICD-10-CM | POA: Diagnosis present

## 2014-09-20 DIAGNOSIS — Z7982 Long term (current) use of aspirin: Secondary | ICD-10-CM

## 2014-09-20 DIAGNOSIS — I739 Peripheral vascular disease, unspecified: Secondary | ICD-10-CM

## 2014-09-20 DIAGNOSIS — Z885 Allergy status to narcotic agent status: Secondary | ICD-10-CM

## 2014-09-20 DIAGNOSIS — B1920 Unspecified viral hepatitis C without hepatic coma: Secondary | ICD-10-CM | POA: Diagnosis present

## 2014-09-20 DIAGNOSIS — Z9862 Peripheral vascular angioplasty status: Secondary | ICD-10-CM

## 2014-09-20 DIAGNOSIS — F419 Anxiety disorder, unspecified: Secondary | ICD-10-CM | POA: Insufficient documentation

## 2014-09-20 DIAGNOSIS — Z7951 Long term (current) use of inhaled steroids: Secondary | ICD-10-CM | POA: Insufficient documentation

## 2014-09-20 DIAGNOSIS — K219 Gastro-esophageal reflux disease without esophagitis: Secondary | ICD-10-CM | POA: Diagnosis present

## 2014-09-20 DIAGNOSIS — R9431 Abnormal electrocardiogram [ECG] [EKG]: Secondary | ICD-10-CM | POA: Insufficient documentation

## 2014-09-20 DIAGNOSIS — Z7902 Long term (current) use of antithrombotics/antiplatelets: Secondary | ICD-10-CM

## 2014-09-20 DIAGNOSIS — I2510 Atherosclerotic heart disease of native coronary artery without angina pectoris: Secondary | ICD-10-CM | POA: Diagnosis present

## 2014-09-20 DIAGNOSIS — G253 Myoclonus: Secondary | ICD-10-CM | POA: Diagnosis present

## 2014-09-20 DIAGNOSIS — G8929 Other chronic pain: Secondary | ICD-10-CM | POA: Diagnosis present

## 2014-09-20 DIAGNOSIS — I7092 Chronic total occlusion of artery of the extremities: Secondary | ICD-10-CM

## 2014-09-20 DIAGNOSIS — N182 Chronic kidney disease, stage 2 (mild): Secondary | ICD-10-CM | POA: Diagnosis present

## 2014-09-20 DIAGNOSIS — Z79899 Other long term (current) drug therapy: Secondary | ICD-10-CM

## 2014-09-20 DIAGNOSIS — I70212 Atherosclerosis of native arteries of extremities with intermittent claudication, left leg: Secondary | ICD-10-CM | POA: Insufficient documentation

## 2014-09-20 DIAGNOSIS — E785 Hyperlipidemia, unspecified: Secondary | ICD-10-CM | POA: Diagnosis present

## 2014-09-20 DIAGNOSIS — R0609 Other forms of dyspnea: Secondary | ICD-10-CM | POA: Diagnosis present

## 2014-09-20 DIAGNOSIS — Z87442 Personal history of urinary calculi: Secondary | ICD-10-CM

## 2014-09-20 DIAGNOSIS — J449 Chronic obstructive pulmonary disease, unspecified: Secondary | ICD-10-CM | POA: Diagnosis present

## 2014-09-20 DIAGNOSIS — N179 Acute kidney failure, unspecified: Secondary | ICD-10-CM | POA: Diagnosis present

## 2014-09-20 DIAGNOSIS — M545 Low back pain: Secondary | ICD-10-CM | POA: Diagnosis present

## 2014-09-20 DIAGNOSIS — Q613 Polycystic kidney, unspecified: Secondary | ICD-10-CM

## 2014-09-20 DIAGNOSIS — F329 Major depressive disorder, single episode, unspecified: Secondary | ICD-10-CM | POA: Diagnosis present

## 2014-09-20 HISTORY — PX: PERIPHERAL VASCULAR CATHETERIZATION: SHX172C

## 2014-09-20 LAB — POCT I-STAT, CHEM 8
BUN: 21 mg/dL — AB (ref 6–20)
CREATININE: 1.8 mg/dL — AB (ref 0.61–1.24)
Calcium, Ion: 1.15 mmol/L (ref 1.12–1.23)
Chloride: 104 mmol/L (ref 101–111)
Glucose, Bld: 109 mg/dL — ABNORMAL HIGH (ref 65–99)
HCT: 43 % (ref 39.0–52.0)
Hemoglobin: 14.6 g/dL (ref 13.0–17.0)
Potassium: 4 mmol/L (ref 3.5–5.1)
Sodium: 137 mmol/L (ref 135–145)
TCO2: 20 mmol/L (ref 0–100)

## 2014-09-20 LAB — POCT ACTIVATED CLOTTING TIME
Activated Clotting Time: 202 seconds
Activated Clotting Time: 202 seconds

## 2014-09-20 SURGERY — ABDOMINAL AORTOGRAM

## 2014-09-20 MED ORDER — HYDRALAZINE HCL 20 MG/ML IJ SOLN
INTRAMUSCULAR | Status: AC
  Filled 2014-09-20: qty 1

## 2014-09-20 MED ORDER — HYDRALAZINE HCL 20 MG/ML IJ SOLN
5.0000 mg | INTRAMUSCULAR | Status: AC | PRN
  Administered 2014-09-20 (×2): 5 mg via INTRAVENOUS

## 2014-09-20 MED ORDER — METOPROLOL TARTRATE 1 MG/ML IV SOLN: 2.0000 mg | INTRAVENOUS | Status: DC | PRN

## 2014-09-20 MED ORDER — CLOPIDOGREL BISULFATE 75 MG PO TABS: 75.0000 mg | ORAL_TABLET | Freq: Every day | ORAL | Status: DC

## 2014-09-20 MED ORDER — ACETAMINOPHEN 325 MG PO TABS: 325.0000 mg | ORAL_TABLET | ORAL | Status: DC | PRN

## 2014-09-20 MED ORDER — HYDROMORPHONE HCL 1 MG/ML IJ SOLN: 0.5000 mg | INTRAMUSCULAR | Status: DC | PRN

## 2014-09-20 MED ORDER — HEPARIN SODIUM (PORCINE) 1000 UNIT/ML IJ SOLN
INTRAMUSCULAR | Status: AC
  Filled 2014-09-20: qty 1

## 2014-09-20 MED ORDER — SODIUM CHLORIDE 0.9 % IV SOLN
INTRAVENOUS | Status: DC
  Administered 2014-09-20: 09:00:00 via INTRAVENOUS

## 2014-09-20 MED ORDER — ONDANSETRON HCL 4 MG/2ML IJ SOLN: 4.0000 mg | Freq: Four times a day (QID) | INTRAMUSCULAR | Status: DC | PRN

## 2014-09-20 MED ORDER — ACETAMINOPHEN 325 MG RE SUPP: 325.0000 mg | RECTAL | Status: DC | PRN

## 2014-09-20 MED ORDER — GUAIFENESIN-DM 100-10 MG/5ML PO SYRP: 15.0000 mL | ORAL_SOLUTION | ORAL | Status: DC | PRN

## 2014-09-20 MED ORDER — LABETALOL HCL 5 MG/ML IV SOLN
INTRAVENOUS | Status: AC
  Filled 2014-09-20: qty 4

## 2014-09-20 MED ORDER — FENTANYL CITRATE (PF) 100 MCG/2ML IJ SOLN
INTRAMUSCULAR | Status: DC | PRN
  Administered 2014-09-20 (×2): 25 ug via INTRAVENOUS

## 2014-09-20 MED ORDER — MIDAZOLAM HCL 2 MG/2ML IJ SOLN
INTRAMUSCULAR | Status: AC
  Filled 2014-09-20: qty 2

## 2014-09-20 MED ORDER — HEPARIN SODIUM (PORCINE) 1000 UNIT/ML IJ SOLN
INTRAMUSCULAR | Status: DC | PRN
  Administered 2014-09-20: 7000 [IU] via INTRAVENOUS
  Administered 2014-09-20: 2000 [IU] via INTRAVENOUS

## 2014-09-20 MED ORDER — FENTANYL CITRATE (PF) 100 MCG/2ML IJ SOLN
INTRAMUSCULAR | Status: AC
  Filled 2014-09-20: qty 2

## 2014-09-20 MED ORDER — CLOPIDOGREL BISULFATE 300 MG PO TABS: 300.0000 mg | ORAL_TABLET | Freq: Once | ORAL | Status: DC

## 2014-09-20 MED ORDER — DOCUSATE SODIUM 100 MG PO CAPS: 100.0000 mg | ORAL_CAPSULE | Freq: Every day | ORAL | Status: DC

## 2014-09-20 MED ORDER — MIDAZOLAM HCL 2 MG/2ML IJ SOLN
INTRAMUSCULAR | Status: DC | PRN
  Administered 2014-09-20 (×2): 1 mg via INTRAVENOUS

## 2014-09-20 MED ORDER — IODIXANOL 320 MG/ML IV SOLN
INTRAVENOUS | Status: DC | PRN
  Administered 2014-09-20: 100 mL via INTRAVENOUS

## 2014-09-20 MED ORDER — LIDOCAINE HCL (PF) 1 % IJ SOLN
INTRAMUSCULAR | Status: AC
  Filled 2014-09-20: qty 30

## 2014-09-20 MED ORDER — ALUM & MAG HYDROXIDE-SIMETH 200-200-20 MG/5ML PO SUSP: 15.0000 mL | ORAL | Status: DC | PRN

## 2014-09-20 MED ORDER — PHENOL 1.4 % MT LIQD: 1.0000 | OROMUCOSAL | Status: DC | PRN

## 2014-09-20 MED ORDER — SODIUM CHLORIDE 0.9 % IV SOLN
INTRAVENOUS | Status: DC
Start: 2014-09-20 — End: 2014-09-20

## 2014-09-20 MED ORDER — LABETALOL HCL 5 MG/ML IV SOLN
10.0000 mg | INTRAVENOUS | Status: DC | PRN
  Administered 2014-09-20 (×2): 10 mg via INTRAVENOUS

## 2014-09-20 MED ORDER — HEPARIN (PORCINE) IN NACL 2-0.9 UNIT/ML-% IJ SOLN
INTRAMUSCULAR | Status: AC
  Filled 2014-09-20: qty 1000

## 2014-09-20 SURGICAL SUPPLY — 21 items
BALLN ARMADA 4X100X135 (BALLOONS) ×3
BALLN ARMADA 6X100X135 (BALLOONS) ×3
CATH OMNI FLUSH 5F 65CM (CATHETERS) ×3
CATH QUICKCROSS SUPP .035X90CM (MICROCATHETER) ×3
DEVICE CONTINUOUS FLUSH (MISCELLANEOUS) ×3
DRAPE ZERO GRAVITY STERILE (DRAPES) ×3
GUIDEWIRE ANGLED .035X150CM (WIRE) ×3
KIT ENCORE 26 ADVANTAGE (KITS) ×3
KIT MICROINTRODUCER STIFF 5F (SHEATH) ×3
KIT PV (KITS) ×3
SHEATH PINNACLE 5F 10CM (SHEATH) ×3
SHEATH PINNACLE ST 7F 45CM (SHEATH) ×3
SHIELD RADPAD SCOOP 12X17 (MISCELLANEOUS) ×3
STENT SMART FLEX 7X100X120 (Permanent Stent) ×3 IMPLANT
SYR MEDRAD MARK V 150ML (SYRINGE)
TAPE RADIOPAQUE TURBO (MISCELLANEOUS) ×3
TRANSDUCER W/STOPCOCK (MISCELLANEOUS) ×3
TRAY PV CATH (CUSTOM PROCEDURE TRAY) ×3
WIRE BENTSON .035X145CM (WIRE) ×3
WIRE ROSEN-J .035X180CM (WIRE) ×3
WIRE ROSEN-J .035X260CM (WIRE) ×3

## 2014-09-20 NOTE — Discharge Instructions (Signed)
Start plavix 75 mg once daily/ also called clopidogrel    Angiogram, Care After Refer to this sheet in the next few weeks. These instructions provide you with information on caring for yourself after your procedure. Your health care provider may also give you more specific instructions. Your treatment has been planned according to current medical practices, but problems sometimes occur. Call your health care provider if you have any problems or questions after your procedure.  WHAT TO EXPECT AFTER THE PROCEDURE After your procedure, it is typical to have the following sensations:  Minor discomfort or tenderness and a small bump at the catheter insertion site. The bump should usually decrease in size and tenderness within 1 to 2 weeks.  Any bruising will usually fade within 2 to 4 weeks. HOME CARE INSTRUCTIONS   You may need to keep taking blood thinners if they were prescribed for you. Take medicines only as directed by your health care provider.  Do not apply powder or lotion to the site.  Do not take baths, swim, or use a hot tub until your health care provider approves.  You may shower 24 hours after the procedure. Remove the bandage (dressing) and gently wash the site with plain soap and water. Gently pat the site dry.  Inspect the site at least twice daily.  Limit your activity for the first 48 hours. Do not bend, squat, or lift anything over 20 lb (9 kg) or as directed by your health care provider.  Plan to have someone take you home after the procedure. Follow instructions about when you can drive or return to work. SEEK MEDICAL CARE IF:  You get light-headed when standing up.  You have drainage (other than a small amount of blood on the dressing).  You have chills.  You have a fever.  You have redness, warmth, swelling, or pain at the insertion site. SEEK IMMEDIATE MEDICAL CARE IF:   You develop chest pain or shortness of breath, feel faint, or pass out.  You have  bleeding, swelling larger than a walnut, or drainage from the catheter insertion site.  You develop pain, discoloration, coldness, or severe bruising in the leg or arm that held the catheter.  You develop bleeding from any other place, such as the bowels. You may see bright red blood in your urine or stools, or your stools may appear black and tarry.  You have heavy bleeding from the site. If this happens, hold pressure on the site. MAKE SURE YOU:  Understand these instructions.  Will watch your condition.  Will get help right away if you are not doing well or get worse. Document Released: 10/17/2004 Document Revised: 08/15/2013 Document Reviewed: 08/23/2012 [facility] Patient Information 2015 [place], [place]. This information is not intended to replace advice given to you by your health care provider. Make sure you discuss any questions you have with your health care provider.

## 2014-09-20 NOTE — Op Note (Signed)
Patient name: Aaron [name] MRN: [id number] DOB: 07/31/1955 Sex: male  09/20/2014 Pre-operative Diagnosis: Left leg claudication Post-operative diagnosis:  Same Surgeon:  [name] Procedure Performed:  1.  Ultrasound-guided access, right femoral artery  2.  Abdominal aortogram  3.  Left lower extremity runoff  4.  Third order catheterization  5.  Stent, left superficial femoral artery    Indications:  The patient has a several month history of leg cramping with activity.  Ultrasound revealed an occluded superficial femoral artery.  He is here today for further evaluation and possible intervention.  Procedure:  The patient was identified in the holding area and taken to room 8.  The patient was then placed supine on the table and prepped and draped in the usual sterile fashion.  A time out was called.  Ultrasound was used to evaluate the right common femoral artery.  It was patent .  A digital ultrasound image was acquired.  A micropuncture needle was used to access the right common femoral artery under ultrasound guidance.  An 018 wire was advanced without resistance and a micropuncture sheath was placed.  The 018 wire was removed and a benson wire was placed.  The micropuncture sheath was exchanged for a 5 french sheath.  An omniflush catheter was advanced over the wire to the level of L-1.  An abdominal angiogram was obtained.  Next, using the omniflush catheter and a benson wire, the aortic bifurcation was crossed and the catheter was placed into theleft external iliac artery and left runoff was obtained.    Findings:   Aortogram:  no significant renal artery stenosis is identified.  The infrarenal abdominal aorta is widely patent.  Bilateral common and external iliac arteries are widely patent.  Right Lower Extremity:  Not evaluated given elevated creatinine and contrast required for left leg intervention   Left Lower Extremity:  mild calcification in the distal left common femoral  artery with approximately 30% stenosis.  He is also calcification of the proximal superficial femoral artery which is not hemodynamically significant.  The superficial femoral artery is patent down to the abductor canal where it is occluded for approximately 8 cm.  There is reconstitution of the above-knee popliteal artery and two-vessel runoff via the anterior tibial and peroneal artery.   Intervention:   After the above images were acquired, the decision was made to proceed with intervention.  Over a Rosen wire a 7 French sheath was advanced into the left external iliac artery.  The patient was fully heparinized.  Using a 035 Glidewire and a quick cross catheter, subintimal recanalization was performed.  A contrast injection was performed with the catheter in the above-knee popliteal artery, confirming successful crossing of the lesion.  A Rosen wire was placed.  The occlusion was predilated with a 4 mm balloon.  I then primarily stented this area using a 7 x 100 Cordis Smart flex stent.  This was postdilated with a 6 mm balloon.  Completion angiogram revealed resolution of the stenosis and no change in runoff.  Catheters and wires were removed.  The sheath was withdrawn to the right external iliac artery.  The patient be taken the holding area for sheath pull once his coagulation profile corrects.  Impression:   #1  mild calcification within the distal left common femoral artery and proximal superficial femoral artery without hemodynamically significant stenosis.    #2  no significant aortoiliac occlusive disease   #3  total occlusion of the left superficial femoral  artery for a distance of approximately 8 cm.  This was successfully recanalized and primarily stented using a 7 x 100 Cordis self-expanding stent.    #4  two-vessel runoff      V. [name], M.D. [facility] Office: [phone] Pager:  [phone]

## 2014-09-20 NOTE — Interval H&P Note (Signed)
History and Physical Interval Note:  09/20/2014 10:09 AM  Aaron [name]  has presented today for surgery, with the diagnosis of pvd w/left leg claudication  The various methods of treatment have been discussed with the patient and family. After consideration of risks, benefits and other options for treatment, the patient has consented to  Procedure(s): Abdominal Aortogram (N/A) as a surgical intervention .  The patient's history has been reviewed, patient examined, no change in status, stable for surgery.  I have reviewed the patient's chart and labs.  Questions were answered to the patient's satisfaction.     [name]

## 2014-09-20 NOTE — Progress Notes (Addendum)
Site area: RFA Site Prior to Removal:  Level 0 Pressure Applied For: 60 min Manual:  yes  Patient Status During Pull: stable  Post Pull Site:  Level 0 Post Pull Instructions Given:yes   Post Pull Pulses Present: palpable Dressing Applied:  clear Bedrest begins @ 1355 Comments:hypertensive in face of Rx

## 2014-09-20 NOTE — Progress Notes (Signed)
Notified Dr Trula Slade of Cr 1.8, order to continue NS at @100  cc/hr

## 2014-09-20 NOTE — H&P (View-Only) (Signed)
Subjective:     Patient ID: Aaron [name], male   DOB: Sep 06, 1955, 59 y.o.   MRN: [id number]  HPI this 59 year old male developed left leg cramping particularly in the calf area and March of this year. This came on rather gradually. He now has severe claudication type symptoms which limit him to walking about 100 yards. At that point he becomes very numb in the left calf and foot area. He denies any history of infection cellulitis gangrene or rest pain. He has no symptoms the contralateral right leg. He has a long history of tobacco abuse 1-1/2-2 packs per day for 40+ years but states he did quit about a month ago.  Past Medical History  Diagnosis Date  . Other and unspecified hyperlipidemia   . Unspecified cardiovascular disease   . Unspecified essential hypertension   . Polycystic kidney, unspecified type   . Chronic airway obstruction, not elsewhere classified   . Unspecified viral hepatitis C without hepatic coma   . Esophageal reflux   . Other, mixed, or unspecified nondependent drug abuse, unspecified   . Calculus of kidney   . Anxiety state, unspecified   . Migraine, unspecified, without mention of intractable migraine without mention of status migrainosus   . Backache, unspecified   . Other B-complex deficiencies   . Other malaise and fatigue   . Diverticulitis     History  Substance Use Topics  . Smoking status: Former Smoker -- 1.00 packs/day for 47 years    Types: Cigarettes    Quit date: 08/13/2014  . Smokeless tobacco: Never Used  . Alcohol Use: No    Family History  Problem Relation Age of Onset  . Coronary artery disease      family history  . Cancer Maternal Grandfather 47     colon cancer  . Cancer Cousin      kidney cancer  . Cancer Sister      male cancer    Allergies  Allergen Reactions  . Codeine Itching and Nausea Only     Current outpatient prescriptions:  .  albuterol (PROVENTIL HFA) 108 (90 BASE) MCG/ACT inhaler, Inhale 2 puffs into the  lungs every 6 (six) hours as needed.  , Disp: , Rfl:  .  amitriptyline (ELAVIL) 10 MG tablet, Take 10 mg by mouth at bedtime., Disp: , Rfl:  .  amLODipine (NORVASC) 10 MG tablet, Take 10 mg by mouth daily. , Disp: , Rfl:  .  aspirin 81 MG tablet, Take 81 mg by mouth daily.  , Disp: , Rfl:  .  baclofen (LIORESAL) 10 MG tablet, Take 10 mg by mouth. , Disp: , Rfl:  .  cetirizine (ZYRTEC) 10 MG tablet, Take 10 mg by mouth daily., Disp: , Rfl:  .  lisinopril (PRINIVIL,ZESTRIL) 20 MG tablet, Take 20 mg by mouth daily. , Disp: , Rfl:  .  omeprazole (PRILOSEC) 20 MG capsule, Take 20 mg by mouth daily.  , Disp: , Rfl:  .  oxyCODONE-acetaminophen (PERCOCET) 10-325 MG per tablet, Take 1 tablet by mouth every 8 (eight) hours as needed.  , Disp: , Rfl:  .  tiotropium (SPIRIVA) 18 MCG inhalation capsule, Place 18 mcg into inhaler and inhale daily.  , Disp: , Rfl:  .  budesonide-formoterol (SYMBICORT) 80-4.5 MCG/ACT inhaler, Inhale 2 puffs into the lungs 2 (two) times daily.  , Disp: , Rfl:  .  lisinopril-hydrochlorothiazide (PRINZIDE,ZESTORETIC) 20-12.5 MG per tablet, Take 1 tablet by mouth daily.  , Disp: , Rfl:  .  nitroGLYCERIN (NITROSTAT) 0.4 MG SL tablet, Place 1 tablet (0.4 mg total) under the tongue every 5 (five) minutes as needed for chest pain., Disp: 25 tablet, Rfl: 3  Filed Vitals:   09/19/14 1116  BP: 157/109  Pulse: 70  Height: 6\' 4"  (1.93 m)  Weight: 202 lb 1.6 oz (91.672 kg)  SpO2: 98%    Body mass index is 24.61 kg/(m^2).           Review of Systems denies chest pain, dyspnea on exertion, PND, orthopnea, hemoptysis. Patient does have symptoms of asthma and wheezing on occasion. Heavy history of tobacco abuse as noted in history of present illness. All other systems negative and complete review of systems     Objective:   Physical Exam BP 157/109 mmHg  Pulse 70  Ht 6\' 4"  (1.93 m)  Wt 202 lb 1.6 oz (91.672 kg)  BMI 24.61 kg/m2  SpO2 98%  Gen.-alert and oriented x3 in no  apparent distress HEENT normal for age Lungs no rhonchi or wheezing Cardiovascular regular rhythm no murmurs carotid pulses 3+ palpable no bruits audible Abdomen soft nontender no palpable masses Musculoskeletal free of  major deformities Skin clear -no rashes Neurologic normal Lower extremities 3+ femoral and dorsalis pedis and posterior tibial pulse palpable on the right Left leg with 3+ femoral absent popliteal and distal pulses. Left foot with slight decreased sensation compared to right. No active infection ulceration or gangrene noted.  Today I ordered lower extremity duplex scan and ABIs which I reviewed and interpreted. ABI on the left foot is 0.56 compared to 1.0 on the right. Duplex scan reveals possible moderate stenosis of the left common femoral artery and possible total occlusion of the left SFA in the mid thigh.       Assessment:     #1 PAD with likely total occlusion left superficial femoral artery with limiting claudication for past 2-1/2 months Possible moderate stenosis left common femoral artery #2 tobacco abuse-1-1/2-2 packs per day for 40+ years but patient states he recently quit smoking #3 history of hypertension controlled on medication    Plan:     Plan aortobifemoral angiography with possible PTA and stenting left superficial femoral artery if feasible and appropriate If patient has significant left common femoral occlusive disease may require endarterectomy and patch angioplasty-to be determined by angiographic findings His scheduled angiogram for Dr. Trula Slade tomorrow June 8 and decision regarding appropriate treatment will then be made

## 2014-09-21 ENCOUNTER — Encounter: Payer: Self-pay | Admitting: Neurosurgery

## 2014-09-21 MED FILL — Heparin Sodium (Porcine) 2 Unit/ML in Sodium Chloride 0.9%: INTRAMUSCULAR | Qty: 1000 | Status: AC

## 2014-09-21 MED FILL — Lidocaine HCl Local Preservative Free (PF) Inj 1%: INTRAMUSCULAR | Qty: 30 | Status: AC

## 2014-09-22 ENCOUNTER — Inpatient Hospital Stay (HOSPITAL_COMMUNITY)
Admission: EM | Admit: 2014-09-22 | Discharge: 2014-09-24 | DRG: 253 | Disposition: A | Discharge status: Home / Self Care | Payer: Medicare Other | Attending: Internal Medicine | Admitting: Internal Medicine

## 2014-09-22 ENCOUNTER — Emergency Department (HOSPITAL_COMMUNITY): Payer: Medicare Other

## 2014-09-22 ENCOUNTER — Encounter (HOSPITAL_COMMUNITY): Payer: Self-pay | Admitting: *Deleted

## 2014-09-22 ENCOUNTER — Telehealth: Payer: Self-pay | Admitting: Surgery

## 2014-09-22 DIAGNOSIS — R55 Syncope and collapse: Secondary | ICD-10-CM | POA: Diagnosis present

## 2014-09-22 DIAGNOSIS — N189 Chronic kidney disease, unspecified: Secondary | ICD-10-CM

## 2014-09-22 DIAGNOSIS — J449 Chronic obstructive pulmonary disease, unspecified: Secondary | ICD-10-CM | POA: Diagnosis present

## 2014-09-22 DIAGNOSIS — F32A Depression, unspecified: Secondary | ICD-10-CM

## 2014-09-22 DIAGNOSIS — I739 Peripheral vascular disease, unspecified: Secondary | ICD-10-CM | POA: Diagnosis present

## 2014-09-22 DIAGNOSIS — R109 Unspecified abdominal pain: Secondary | ICD-10-CM

## 2014-09-22 DIAGNOSIS — I10 Essential (primary) hypertension: Secondary | ICD-10-CM | POA: Diagnosis present

## 2014-09-22 DIAGNOSIS — F341 Dysthymic disorder: Secondary | ICD-10-CM | POA: Diagnosis present

## 2014-09-22 DIAGNOSIS — N179 Acute kidney failure, unspecified: Secondary | ICD-10-CM | POA: Diagnosis present

## 2014-09-22 DIAGNOSIS — F329 Major depressive disorder, single episode, unspecified: Secondary | ICD-10-CM

## 2014-09-22 DIAGNOSIS — R253 Fasciculation: Secondary | ICD-10-CM | POA: Diagnosis present

## 2014-09-22 DIAGNOSIS — K219 Gastro-esophageal reflux disease without esophagitis: Secondary | ICD-10-CM | POA: Diagnosis present

## 2014-09-22 HISTORY — DX: Atherosclerotic heart disease of native coronary artery without angina pectoris: I25.10

## 2014-09-22 HISTORY — DX: Personal history of other specified conditions: Z87.898

## 2014-09-22 HISTORY — DX: Hyperlipidemia, unspecified: E78.5

## 2014-09-22 HISTORY — DX: Peripheral vascular disease, unspecified: I73.9

## 2014-09-22 HISTORY — DX: Dorsalgia, unspecified: M54.9

## 2014-09-22 HISTORY — DX: Anxiety disorder, unspecified: F41.9

## 2014-09-22 HISTORY — DX: Other chronic pain: G89.29

## 2014-09-22 HISTORY — DX: Essential (primary) hypertension: I10

## 2014-09-22 HISTORY — DX: Unspecified viral hepatitis C without hepatic coma: B19.20

## 2014-09-22 HISTORY — DX: Chronic obstructive pulmonary disease, unspecified: J44.9

## 2014-09-22 LAB — BASIC METABOLIC PANEL
ANION GAP: 11 (ref 5–15)
BUN: 15 mg/dL (ref 6–20)
CO2: 24 mmol/L (ref 22–32)
Calcium: 8.9 mg/dL (ref 8.9–10.3)
Chloride: 103 mmol/L (ref 101–111)
Creatinine, Ser: 1.89 mg/dL — ABNORMAL HIGH (ref 0.61–1.24)
GFR calc Af Amer: 43 mL/min — ABNORMAL LOW (ref >60)
GFR calc non Af Amer: 38 mL/min — ABNORMAL LOW (ref >60)
Glucose, Bld: 116 mg/dL — ABNORMAL HIGH (ref 65–99)
Potassium: 5 mmol/L (ref 3.5–5.1)
Sodium: 138 mmol/L (ref 135–145)

## 2014-09-22 LAB — HEPATIC FUNCTION PANEL
ALT: 14 U/L — ABNORMAL LOW (ref 17–63)
AST: 23 U/L (ref 15–41)
Albumin: 3 g/dL — ABNORMAL LOW (ref 3.5–5.0)
Alkaline Phosphatase: 108 U/L (ref 38–126)
BILIRUBIN TOTAL: 0.7 mg/dL (ref 0.3–1.2)
Bilirubin, Direct: <0.1 mg/dL — ABNORMAL LOW (ref 0.1–0.5)
Total Protein: 6.7 g/dL (ref 6.5–8.1)

## 2014-09-22 LAB — CBC WITH DIFFERENTIAL/PLATELET
BASOS ABS: 0 10*3/uL (ref 0.0–0.1)
Basophils Relative: 0 % (ref 0–1)
EOS ABS: 0 10*3/uL (ref 0.0–0.7)
Eosinophils Relative: 0 % (ref 0–5)
HCT: 40.1 % (ref 39.0–52.0)
HEMOGLOBIN: 13.6 g/dL (ref 13.0–17.0)
Lymphocytes Relative: 6 % — ABNORMAL LOW (ref 12–46)
Lymphs Abs: 0.7 10*3/uL (ref 0.7–4.0)
MCH: 32.2 pg (ref 26.0–34.0)
MCHC: 33.9 g/dL (ref 30.0–36.0)
MCV: 94.8 fL (ref 78.0–100.0)
Monocytes Absolute: 0.5 10*3/uL (ref 0.1–1.0)
Monocytes Relative: 4 % (ref 3–12)
Neutro Abs: 10 10*3/uL — ABNORMAL HIGH (ref 1.7–7.7)
Neutrophils Relative %: 90 % — ABNORMAL HIGH (ref 43–77)
Platelets: 234 10*3/uL (ref 150–400)
RBC: 4.23 MIL/uL (ref 4.22–5.81)
RDW: 14.2 % (ref 11.5–15.5)
WBC: 11.1 10*3/uL — AB (ref 4.0–10.5)

## 2014-09-22 LAB — TROPONIN I: Troponin I: <0.03 ng/mL (ref <0.031)

## 2014-09-22 LAB — POCT ACTIVATED CLOTTING TIME: Activated Clotting Time: 171 seconds

## 2014-09-22 MED ORDER — ONDANSETRON HCL 4 MG/2ML IJ SOLN
4.0000 mg | Freq: Once | INTRAMUSCULAR | Status: AC
  Administered 2014-09-22: 4 mg via INTRAVENOUS
  Filled 2014-09-22: qty 2

## 2014-09-22 MED ORDER — SODIUM CHLORIDE 0.9 % IV BOLUS (SEPSIS)
1000.0000 mL | Freq: Once | INTRAVENOUS | Status: AC
  Administered 2014-09-22: 1000 mL via INTRAVENOUS

## 2014-09-22 NOTE — ED Notes (Signed)
The pt  Had a stent placed in the artery in  His rt leg this past Wednesday.  Last night he started voniting and passed out in the br and the living room after he vomited x 2.  He has passed out x 2 today.  He feels jittgery and he feel,like he jerks  Intermittently.  He has been vomiting up green liquid.  He had dizziness  Once before  He fell.  At present alert oriented skin warm and dry.  He reports he has swelling in bithg his   Ankles and in his toes which he called his fingers initially.  He also reports that he does not keep uop with the months  All other questiions answered appropriately

## 2014-09-22 NOTE — ED Notes (Signed)
Patient transported to CT 

## 2014-09-22 NOTE — Telephone Encounter (Signed)
-----   Message from Mena Goes, RN sent at 09/20/2014 12:02 PM EDT ----- Regarding: Schedule    ----- Message -----    From: Serafina Mitchell, MD    Sent: 09/20/2014  11:50 AM      To: [facility] Charge Pool  09/20/2014:  Surgeon:  [name] Procedure Performed:  1.  Ultrasound-guided access, right femoral artery  2.  Abdominal aortogram  3.  Left lower extremity runoff  4.  Third order catheterization  5.  Stent, left superficial femoral artery Follow-up with Vinnie Level and 3 months with lower extremity Doppler studies and ABI

## 2014-09-22 NOTE — ED Notes (Signed)
Pt taken to ct 

## 2014-09-22 NOTE — Telephone Encounter (Signed)
No answer, no vm, dpm °

## 2014-09-22 NOTE — ED Notes (Signed)
Pt c/o nausea but wants something to drink

## 2014-09-22 NOTE — ED Notes (Signed)
Per EMS- Pt is coming from home reporting syncopal episodes; had stent placed in leg on Wednesday since then hasnt been feeling right. BP 180/94, SR EKG occassional PVCs, 14 RR, 96% RA. Pt is a x 4.

## 2014-09-22 NOTE — Consult Note (Addendum)
Patient name: Aaron [name] MRN: [id number] DOB: February 20, 1956 Sex: male   Referred by: EDP  Reason for referral:  Chief Complaint  Patient presents with  . Loss of Consciousness    HISTORY OF PRESENT ILLNESS: Patient is a 59 year old gentleman presents with syncope. We will were notified since he had the aortogram and left superficial femoral artery intervention by Dr. Trula Slade 2 days ago. He reports that he had a syncopal episode yesterday and another one today. He was sitting on the edge of his time and woke up on the floor. He reports that this evening he was sitting in his living room and also woke up on the floor. He denies any prior episodes such as this. He does have some mild soreness in his groins related to his recent arteriogram.  Past Medical History  Diagnosis Date  . Other and unspecified hyperlipidemia   . Unspecified cardiovascular disease   . Unspecified essential hypertension   . Polycystic kidney, unspecified type   . Chronic airway obstruction, not elsewhere classified   . Unspecified viral hepatitis C without hepatic coma   . Esophageal reflux   . Other, mixed, or unspecified nondependent drug abuse, unspecified   . Calculus of kidney   . Anxiety state, unspecified   . Migraine, unspecified, without mention of intractable migraine without mention of status migrainosus   . Backache, unspecified   . Other B-complex deficiencies   . Other malaise and fatigue   . Diverticulitis     Past Surgical History  Procedure Laterality Date  . Back surgery    . Bunionectomy    . Cardiac catheterization    . Peripheral vascular catheterization N/A 09/20/2014    Procedure: Abdominal Aortogram;  Surgeon: Serafina Mitchell, MD;  Location: [facility];  Service: Cardiovascular;  Laterality: N/A;    History   Social History  . Marital Status: Married    Spouse Name: N/A  . Number of Children: 2  . Years of Education: N/A   Occupational History  . DISABLED      Social History Main Topics  . Smoking status: Former Smoker -- 1.00 packs/day for 47 years    Types: Cigarettes    Quit date: 08/13/2014  . Smokeless tobacco: Never Used  . Alcohol Use: No  . Drug Use: No  . Sexual Activity: Not on file   Other Topics Concern  . Not on file   Social History Narrative   Disabled from Back since 2007    Family History  Problem Relation Age of Onset  . Coronary artery disease      family history  . Cancer Maternal Grandfather 58     colon cancer  . Cancer Cousin      kidney cancer  . Cancer Sister      male cancer    Allergies as of 09/22/2014 - Review Complete 09/22/2014  Allergen Reaction Noted  . Codeine Itching and Nausea Only     No current facility-administered medications on file prior to encounter.   Current Outpatient Prescriptions on File Prior to Encounter  Medication Sig Dispense Refill  . albuterol (PROVENTIL HFA) 108 (90 BASE) MCG/ACT inhaler Inhale 2 puffs into the lungs every 6 (six) hours as needed for wheezing or shortness of breath.     Marland Kitchen amitriptyline (ELAVIL) 10 MG tablet Take 10 mg by mouth at bedtime.    Marland Kitchen amLODipine (NORVASC) 10 MG tablet Take 10 mg by mouth daily.     Marland Kitchen  aspirin 81 MG tablet Take 81 mg by mouth daily.      . baclofen (LIORESAL) 10 MG tablet Take 10 mg by mouth 3 (three) times daily.     . budesonide-formoterol (SYMBICORT) 80-4.5 MCG/ACT inhaler Inhale 2 puffs into the lungs 2 (two) times daily.      Marland Kitchen buPROPion (WELLBUTRIN SR) 150 MG 12 hr tablet Take 150 mg by mouth 2 (two) times daily.    . clopidogrel (PLAVIX) 75 MG tablet Take 1 tablet (75 mg total) by mouth daily. 30 tablet 11  . Diclofenac Sodium 3 % GEL Apply 1 application topically 2 (two) times daily as needed (muscle pain).     Marland Kitchen lisinopril (PRINIVIL,ZESTRIL) 20 MG tablet Take 20 mg by mouth daily.     Marland Kitchen omeprazole (PRILOSEC) 20 MG capsule Take 20 mg by mouth daily.      Marland Kitchen oxyCODONE-acetaminophen (PERCOCET) 10-325 MG per tablet Take  1 tablet by mouth every 8 (eight) hours as needed for pain.     Marland Kitchen tiotropium (SPIRIVA) 18 MCG inhalation capsule Place 18 mcg into inhaler and inhale daily.         REVIEW OF SYSTEMS:    PHYSICAL EXAMINATION:  General: The patient is a well-nourished male, in no acute distress. Vital signs are BP 162/95 mmHg  Pulse 71  Temp(Src) 98.1 F (36.7 C) (Oral)  Resp 15  SpO2 97% Pulmonary: There is a good air exchange Musculoskeletal: There are no major deformities.  There is no significant extremity pain. Neurologic: No focal weakness or paresthesias are detected, Skin: There are no ulcer or rashes noted. Psychiatric: The patient has normal affect. Cardiovascular: Pulse status: 2+ popliteal and 2+ dorsalis pedis pulses bilaterally Right groin puncture site looks quite good with no evidence of hematoma.  Reviewed the films from 2 days ago. Had excellent result from treatment of a total occlusion of her proximal 8 cm in mid superficial femoral artery on the left with very good result.   Impression and Plan:  Discussed with the patient. His had a very good result from his left superficial femoral artery angioplasty of a complete occlusion with angioplasty and stenting. This has excellent normal flow to his foot. Explained to the patient that I do not see any correlation for relationship between his new onset of syncopal episodes and his recent treatment. Patient will be admitted for evaluation of this. Will notify Dr. Trula Slade of the events of this admission.    Curt Jews [facility] Office: [phone]

## 2014-09-22 NOTE — ED Provider Notes (Signed)
CSN: [id number]     Arrival date & time 09/22/14  [id number] History   First MD Initiated Contact with Patient 09/22/14 1926     Chief Complaint  Patient presents with  . Loss of Consciousness     (Consider location/radiation/quality/duration/timing/severity/associated sxs/prior Treatment) HPI   59 year old male with history of chronic airway obstruction, hepatitis C, cardiovascular disease, who had a stent placed in his artery on his R leg 2 days ago, brought here via EMS from home for evaluation of syncope.  Pt sts he developed L leg claudication since march.  He was evaluated by vascular surgeon Dr. Trula Slade 2 days ago and subsequently was found to have an arterial clot in his L leg.  He had a Cordis Biliary stent placed to L leg on the same date.  Since the procedure he has been experienced tingling sensation to L leg with burning sensation to L foot.  He also endorse tenderness to his lower abdomen.  He has been nauseated and has vomited multiple times for the past 2 days. Report 3 syncopal episodes yesterday and 2 syncopal episodes today, one was when he was in the bathroom and another episode while he was laying on the couch and found himself laying on the ground.  These are unwitnessed episodes.  He currently report mild difficulty breathing.  NO fever, headache, neck pain, back pain, productive cough, hemoptysis, new numbness or weakness.    Past Medical History  Diagnosis Date  . Other and unspecified hyperlipidemia   . Unspecified cardiovascular disease   . Unspecified essential hypertension   . Polycystic kidney, unspecified type   . Chronic airway obstruction, not elsewhere classified   . Unspecified viral hepatitis C without hepatic coma   . Esophageal reflux   . Other, mixed, or unspecified nondependent drug abuse, unspecified   . Calculus of kidney   . Anxiety state, unspecified   . Migraine, unspecified, without mention of intractable migraine without mention of status migrainosus    . Backache, unspecified   . Other B-complex deficiencies   . Other malaise and fatigue   . Diverticulitis    Past Surgical History  Procedure Laterality Date  . Back surgery    . Bunionectomy    . Cardiac catheterization    . Peripheral vascular catheterization N/A 09/20/2014    Procedure: Abdominal Aortogram;  Surgeon: Serafina Mitchell, MD;  Location: [facility];  Service: Cardiovascular;  Laterality: N/A;   Family History  Problem Relation Age of Onset  . Coronary artery disease      family history  . Cancer Maternal Grandfather 44     colon cancer  . Cancer Cousin      kidney cancer  . Cancer Sister      male cancer   History  Substance Use Topics  . Smoking status: Former Smoker -- 1.00 packs/day for 47 years    Types: Cigarettes    Quit date: 08/13/2014  . Smokeless tobacco: Never Used  . Alcohol Use: No    Review of Systems  All other systems reviewed and are negative.     Allergies  Codeine  Home Medications   Prior to Admission medications   Medication Sig Start Date End Date Taking? Authorizing Provider  albuterol (PROVENTIL HFA) 108 (90 BASE) MCG/ACT inhaler Inhale 2 puffs into the lungs every 6 (six) hours as needed for wheezing or shortness of breath.    Yes Historical Provider, MD  amitriptyline (ELAVIL) 10 MG tablet Take 10 mg  by mouth at bedtime.   Yes Historical Provider, MD  amLODipine (NORVASC) 10 MG tablet Take 10 mg by mouth daily.  08/28/14  Yes Historical Provider, MD  aspirin 81 MG tablet Take 81 mg by mouth daily.     Yes Historical Provider, MD  baclofen (LIORESAL) 10 MG tablet Take 10 mg by mouth 3 (three) times daily.  09/12/14  Yes Historical Provider, MD  budesonide-formoterol (SYMBICORT) 80-4.5 MCG/ACT inhaler Inhale 2 puffs into the lungs 2 (two) times daily.     Yes Historical Provider, MD  buPROPion (WELLBUTRIN SR) 150 MG 12 hr tablet Take 150 mg by mouth 2 (two) times daily. 09/14/14  Yes Historical Provider, MD  clopidogrel  (PLAVIX) 75 MG tablet Take 1 tablet (75 mg total) by mouth daily. 09/20/14  Yes Serafina Mitchell, MD  Diclofenac Sodium 3 % GEL Apply 1 application topically 2 (two) times daily as needed (muscle pain).  09/04/14  Yes Historical Provider, MD  lisinopril (PRINIVIL,ZESTRIL) 20 MG tablet Take 20 mg by mouth daily.  08/28/14  Yes Historical Provider, MD  omeprazole (PRILOSEC) 20 MG capsule Take 20 mg by mouth daily.     Yes Historical Provider, MD  oxyCODONE-acetaminophen (PERCOCET) 10-325 MG per tablet Take 1 tablet by mouth every 8 (eight) hours as needed for pain.    Yes Historical Provider, MD  tiotropium (SPIRIVA) 18 MCG inhalation capsule Place 18 mcg into inhaler and inhale daily.     Yes Historical Provider, MD   BP 178/106 mmHg  Pulse 85  Temp(Src) 98.1 F (36.7 C) (Oral)  Resp 20  SpO2 99% Physical Exam  Constitutional: He is oriented to person, place, and time. He appears well-developed and well-nourished. No distress.  tan skin Caucasian male appears to be in no acute distress.  HENT:  Head: Atraumatic.  No scalp tenderness.  Eyes: Conjunctivae and EOM are normal. Pupils are equal, round, and reactive to light.  Neck: Normal range of motion. Neck supple.  No nuchal rigidity.  Cardiovascular: Normal rate and regular rhythm.   Pulmonary/Chest: Effort normal and breath sounds normal.  Abdominal: Soft. There is tenderness (Tenderness to right inguinal region at the site of recent catheterization. Surrounding skin bruising noted but no obvious hematoma.).  Musculoskeletal: He exhibits tenderness (mild diffuse tenderness throughout left leg most significant in left foot. Trace edema to left lower extremities. Pedal pulse palpable. Brisk cap refill. Normal skin color in left foot, no skin necrosis.).  Neurological: He is alert and oriented to person, place, and time. He has normal strength. No cranial nerve deficit or sensory deficit. He displays a negative Romberg sign. Coordination and gait  normal. GCS eye subscore is 4. GCS verbal subscore is 5. GCS motor subscore is 6.  Skin: No rash noted.  Psychiatric: He has a normal mood and affect.  Nursing note and vitals reviewed.   ED Course  Procedures (including critical care time)  Patient who recently had a left superior femoral stent 2 days ago as treatment of his left leg Claudication. Patient was treated vascular surgeon, Dr. Russella Dar.  He has had multiple syncopal episode, likely neurogenic versus cardiogenic. Unsure seizure activities but low suspicion.  He does complaining of left leg pain and swelling since the procedure. He is neurovascularly intact with trace edema to left lower extremities. Have low suspicion of embolic stroke causing his syncopal episode. His head CT scan is unremarkable. Chest x-ray unremarkable. I did consult at Vascular surgeon Dr. early who was see patient in  the ER.  Care discussed with Dr. Ralene Bathe.  Plan to have pt admitted for further work up of his syncope.  11:54 PM EKG without concerning arrhythmia or acute ischemic changes, troponin is normal. Head CT scan is unremarkable. Normal orthostatic vital sign. No tongue biting or bowel bladder incontinence. Vascular surgeon Dr. early has seen and evaluate patient. He felt that these syncopal episodes are unrelated to recent catheterization.  Will consult for admission.  Pt is aware of plan.    12:25 AM Appreciate consultation from [facility] Dr. Blaine Hamper who agrees to admit pt to tele, under his care for further evaluation of syncope.   Labs Review Labs Reviewed  BASIC METABOLIC PANEL - Abnormal; Notable for the following:    Glucose, Bld 116 (*)    Creatinine, Ser 1.89 (*)    GFR calc non Af Amer 38 (*)    GFR calc Af Amer 43 (*)    All other components within normal limits  CBC WITH DIFFERENTIAL/PLATELET - Abnormal; Notable for the following:    WBC 11.1 (*)    Neutrophils Relative % 90 (*)    Neutro Abs 10.0 (*)    Lymphocytes Relative 6 (*)     All other components within normal limits  HEPATIC FUNCTION PANEL - Abnormal; Notable for the following:    Albumin 3.0 (*)    ALT 14 (*)    Bilirubin, Direct <0.1 (*)    All other components within normal limits  TROPONIN I    Imaging Review Dg Chest 2 View  09/22/2014   CLINICAL DATA:  Chronic cough and congestion. Nausea and vomiting. Syncope. Initial encounter.  EXAM: CHEST  2 VIEW  COMPARISON:  Chest radiograph performed 05/01/2008  FINDINGS: The lungs are well-aerated and clear. There is no evidence of focal opacification, pleural effusion or pneumothorax.  The heart is normal in size; the mediastinal contour is within normal limits. No acute osseous abnormalities are seen.  IMPRESSION: No acute cardiopulmonary process seen.   Electronically Signed   By: Garald Balding M.D.   On: 09/22/2014 19:56   Ct Head Wo Contrast  09/22/2014   CLINICAL DATA:  Syncopal episodes. Recent stent placement. Initial encounter.  EXAM: CT HEAD WITHOUT CONTRAST  TECHNIQUE: Contiguous axial images were obtained from the base of the skull through the vertex without intravenous contrast.  COMPARISON:  None.  FINDINGS: There is no evidence of acute intracranial hemorrhage, parenchymal mass lesion, brain edema or hydrocephalus. Prominent CSF space anterolateral to the right temporal lobe is associated with adjacent calvarial scalloping and most likely represents an arachnoid cyst measuring up to 3.0 x 2.6 cm on image 13. No acute extra-axial fluid collection or midline shift demonstrated. Intracranial vascular calcifications noted.  The visualized right maxillary sinus is completely opacified. The visualized paranasal sinuses, mastoid air cells and middle ears are otherwise clear. The calvarium is intact.  IMPRESSION: 1. No acute intracranial findings. 2. Arachnoid cyst within the right middle cranial fossa.   Electronically Signed   By: Richardean Sale M.D.   On: 09/22/2014 22:01     EKG  Interpretation   Date/Time:  Friday September 22 2014 19:58:25 EDT Ventricular Rate:  78 PR Interval:  166 QRS Duration: 85 QT Interval:  377 QTC Calculation: 429 R Axis:   15 Text Interpretation:  Sinus rhythm Anterior infarct, old Confirmed by  Hazle Coca [phone]) on 09/22/2014 8:44:36 PM      MDM   Final diagnoses:  Syncope    BP  162/95 mmHg  Pulse 88  Temp(Src) 98.1 F (36.7 C) (Oral)  Resp 19  SpO2 96%  I have reviewed nursing notes and vital signs. I personally reviewed the imaging tests through PACS system  I reviewed available ER/hospitalization records thought the EMR     Domenic Moras, PA-C 09/23/14 0025  Quintella Reichert, MD 09/24/14 [phone]

## 2014-09-23 ENCOUNTER — Inpatient Hospital Stay (HOSPITAL_COMMUNITY): Payer: Medicare Other

## 2014-09-23 ENCOUNTER — Encounter (HOSPITAL_COMMUNITY): Payer: Self-pay | Admitting: Internal Medicine

## 2014-09-23 DIAGNOSIS — F32A Depression, unspecified: Secondary | ICD-10-CM | POA: Insufficient documentation

## 2014-09-23 DIAGNOSIS — K219 Gastro-esophageal reflux disease without esophagitis: Secondary | ICD-10-CM | POA: Diagnosis present

## 2014-09-23 DIAGNOSIS — E539 Vitamin B deficiency, unspecified: Secondary | ICD-10-CM | POA: Diagnosis present

## 2014-09-23 DIAGNOSIS — Z87442 Personal history of urinary calculi: Secondary | ICD-10-CM | POA: Diagnosis not present

## 2014-09-23 DIAGNOSIS — I739 Peripheral vascular disease, unspecified: Secondary | ICD-10-CM | POA: Diagnosis not present

## 2014-09-23 DIAGNOSIS — R9431 Abnormal electrocardiogram [ECG] [EKG]: Secondary | ICD-10-CM | POA: Diagnosis present

## 2014-09-23 DIAGNOSIS — R253 Fasciculation: Secondary | ICD-10-CM | POA: Diagnosis present

## 2014-09-23 DIAGNOSIS — K210 Gastro-esophageal reflux disease with esophagitis: Secondary | ICD-10-CM

## 2014-09-23 DIAGNOSIS — N182 Chronic kidney disease, stage 2 (mild): Secondary | ICD-10-CM | POA: Diagnosis present

## 2014-09-23 DIAGNOSIS — G253 Myoclonus: Secondary | ICD-10-CM | POA: Diagnosis present

## 2014-09-23 DIAGNOSIS — J449 Chronic obstructive pulmonary disease, unspecified: Secondary | ICD-10-CM | POA: Diagnosis present

## 2014-09-23 DIAGNOSIS — Z885 Allergy status to narcotic agent status: Secondary | ICD-10-CM | POA: Diagnosis not present

## 2014-09-23 DIAGNOSIS — I129 Hypertensive chronic kidney disease with stage 1 through stage 4 chronic kidney disease, or unspecified chronic kidney disease: Secondary | ICD-10-CM | POA: Diagnosis present

## 2014-09-23 DIAGNOSIS — B1920 Unspecified viral hepatitis C without hepatic coma: Secondary | ICD-10-CM | POA: Diagnosis present

## 2014-09-23 DIAGNOSIS — G43909 Migraine, unspecified, not intractable, without status migrainosus: Secondary | ICD-10-CM | POA: Diagnosis present

## 2014-09-23 DIAGNOSIS — F419 Anxiety disorder, unspecified: Secondary | ICD-10-CM | POA: Diagnosis present

## 2014-09-23 DIAGNOSIS — R55 Syncope and collapse: Secondary | ICD-10-CM | POA: Diagnosis present

## 2014-09-23 DIAGNOSIS — I7092 Chronic total occlusion of artery of the extremities: Secondary | ICD-10-CM | POA: Diagnosis present

## 2014-09-23 DIAGNOSIS — N179 Acute kidney failure, unspecified: Secondary | ICD-10-CM | POA: Diagnosis present

## 2014-09-23 DIAGNOSIS — M545 Low back pain: Secondary | ICD-10-CM | POA: Diagnosis present

## 2014-09-23 DIAGNOSIS — F411 Generalized anxiety disorder: Secondary | ICD-10-CM | POA: Diagnosis present

## 2014-09-23 DIAGNOSIS — R112 Nausea with vomiting, unspecified: Secondary | ICD-10-CM | POA: Diagnosis present

## 2014-09-23 DIAGNOSIS — Z7982 Long term (current) use of aspirin: Secondary | ICD-10-CM | POA: Diagnosis not present

## 2014-09-23 DIAGNOSIS — E860 Dehydration: Secondary | ICD-10-CM | POA: Diagnosis present

## 2014-09-23 DIAGNOSIS — G8929 Other chronic pain: Secondary | ICD-10-CM | POA: Diagnosis present

## 2014-09-23 DIAGNOSIS — Z79891 Long term (current) use of opiate analgesic: Secondary | ICD-10-CM | POA: Diagnosis not present

## 2014-09-23 DIAGNOSIS — Q613 Polycystic kidney, unspecified: Secondary | ICD-10-CM | POA: Diagnosis not present

## 2014-09-23 DIAGNOSIS — F341 Dysthymic disorder: Secondary | ICD-10-CM | POA: Diagnosis present

## 2014-09-23 DIAGNOSIS — E785 Hyperlipidemia, unspecified: Secondary | ICD-10-CM | POA: Diagnosis present

## 2014-09-23 DIAGNOSIS — I10 Essential (primary) hypertension: Secondary | ICD-10-CM

## 2014-09-23 DIAGNOSIS — Z79899 Other long term (current) drug therapy: Secondary | ICD-10-CM | POA: Diagnosis not present

## 2014-09-23 DIAGNOSIS — Z7951 Long term (current) use of inhaled steroids: Secondary | ICD-10-CM | POA: Diagnosis not present

## 2014-09-23 DIAGNOSIS — Z7902 Long term (current) use of antithrombotics/antiplatelets: Secondary | ICD-10-CM | POA: Diagnosis not present

## 2014-09-23 DIAGNOSIS — F329 Major depressive disorder, single episode, unspecified: Secondary | ICD-10-CM

## 2014-09-23 DIAGNOSIS — I2510 Atherosclerotic heart disease of native coronary artery without angina pectoris: Secondary | ICD-10-CM | POA: Diagnosis present

## 2014-09-23 DIAGNOSIS — R0609 Other forms of dyspnea: Secondary | ICD-10-CM | POA: Diagnosis present

## 2014-09-23 DIAGNOSIS — N189 Chronic kidney disease, unspecified: Secondary | ICD-10-CM

## 2014-09-23 DIAGNOSIS — Z87891 Personal history of nicotine dependence: Secondary | ICD-10-CM | POA: Diagnosis not present

## 2014-09-23 DIAGNOSIS — I70212 Atherosclerosis of native arteries of extremities with intermittent claudication, left leg: Secondary | ICD-10-CM | POA: Diagnosis present

## 2014-09-23 DIAGNOSIS — I493 Ventricular premature depolarization: Secondary | ICD-10-CM | POA: Diagnosis present

## 2014-09-23 DIAGNOSIS — I472 Ventricular tachycardia: Secondary | ICD-10-CM | POA: Diagnosis present

## 2014-09-23 HISTORY — DX: Fasciculation: R25.3

## 2014-09-23 LAB — LIPID PANEL
Cholesterol: 186 mg/dL (ref 0–200)
HDL: 63 mg/dL (ref >40)
LDL CALC: 108 mg/dL — AB (ref 0–99)
Total CHOL/HDL Ratio: 3 RATIO
Triglycerides: 74 mg/dL (ref <150)
VLDL: 15 mg/dL (ref 0–40)

## 2014-09-23 LAB — BASIC METABOLIC PANEL
Anion gap: 11 (ref 5–15)
BUN: 14 mg/dL (ref 6–20)
CHLORIDE: 100 mmol/L — AB (ref 101–111)
CO2: 28 mmol/L (ref 22–32)
Calcium: 8.8 mg/dL — ABNORMAL LOW (ref 8.9–10.3)
Creatinine, Ser: 1.69 mg/dL — ABNORMAL HIGH (ref 0.61–1.24)
GFR calc non Af Amer: 43 mL/min — ABNORMAL LOW (ref >60)
GFR, EST AFRICAN AMERICAN: 50 mL/min — AB (ref >60)
Glucose, Bld: 92 mg/dL (ref 65–99)
Potassium: 3.9 mmol/L (ref 3.5–5.1)
Sodium: 139 mmol/L (ref 135–145)

## 2014-09-23 LAB — RAPID URINE DRUG SCREEN, HOSP PERFORMED
AMPHETAMINES: NOT DETECTED
BARBITURATES: NOT DETECTED
Benzodiazepines: NOT DETECTED
Cocaine: NOT DETECTED
Opiates: NOT DETECTED
TETRAHYDROCANNABINOL: NOT DETECTED

## 2014-09-23 LAB — GLUCOSE, CAPILLARY: Glucose-Capillary: 98 mg/dL (ref 65–99)

## 2014-09-23 LAB — ETHANOL

## 2014-09-23 LAB — TSH: TSH: 0.785 u[IU]/mL (ref 0.350–4.500)

## 2014-09-23 LAB — CREATININE, URINE, RANDOM: CREATININE, URINE: 17.99 mg/dL

## 2014-09-23 LAB — SODIUM, URINE, RANDOM: SODIUM UR: 32 mmol/L

## 2014-09-23 LAB — HIV ANTIBODY (ROUTINE TESTING W REFLEX): HIV Screen 4th Generation wRfx: NONREACTIVE

## 2014-09-23 MED ORDER — BUDESONIDE-FORMOTEROL FUMARATE 80-4.5 MCG/ACT IN AERO
2.0000 | INHALATION_SPRAY | Freq: Two times a day (BID) | RESPIRATORY_TRACT | Status: DC
  Administered 2014-09-23 – 2014-09-24 (×2): 2 via RESPIRATORY_TRACT
  Filled 2014-09-23: qty 6.9

## 2014-09-23 MED ORDER — ONDANSETRON HCL 4 MG/2ML IJ SOLN: 4.0000 mg | Freq: Four times a day (QID) | INTRAMUSCULAR | Status: DC | PRN

## 2014-09-23 MED ORDER — IPRATROPIUM-ALBUTEROL 0.5-2.5 (3) MG/3ML IN SOLN
3.0000 mL | RESPIRATORY_TRACT | Status: DC
  Administered 2014-09-23 (×4): 3 mL via RESPIRATORY_TRACT
  Filled 2014-09-23 (×5): qty 3

## 2014-09-23 MED ORDER — DM-GUAIFENESIN ER 30-600 MG PO TB12
1.0000 | ORAL_TABLET | Freq: Two times a day (BID) | ORAL | Status: DC
  Administered 2014-09-23 – 2014-09-24 (×2): 1 via ORAL
  Filled 2014-09-23 (×4): qty 1

## 2014-09-23 MED ORDER — CLOPIDOGREL BISULFATE 75 MG PO TABS
75.0000 mg | ORAL_TABLET | Freq: Every day | ORAL | Status: DC
  Administered 2014-09-24: 75 mg via ORAL
  Filled 2014-09-23 (×2): qty 1

## 2014-09-23 MED ORDER — ONDANSETRON HCL 4 MG/2ML IJ SOLN: 4.0000 mg | Freq: Three times a day (TID) | INTRAMUSCULAR | Status: DC | PRN

## 2014-09-23 MED ORDER — MORPHINE SULFATE 2 MG/ML IJ SOLN
2.0000 mg | INTRAMUSCULAR | Status: DC | PRN
  Administered 2014-09-23: 2 mg via INTRAVENOUS
  Filled 2014-09-23: qty 1

## 2014-09-23 MED ORDER — ACETAMINOPHEN 650 MG RE SUPP: 650.0000 mg | Freq: Four times a day (QID) | RECTAL | Status: DC | PRN

## 2014-09-23 MED ORDER — SODIUM CHLORIDE 0.9 % IJ SOLN: 3.0000 mL | Freq: Two times a day (BID) | INTRAMUSCULAR | Status: DC

## 2014-09-23 MED ORDER — BUPROPION HCL ER (SR) 150 MG PO TB12
150.0000 mg | ORAL_TABLET | Freq: Two times a day (BID) | ORAL | Status: DC
  Administered 2014-09-23 – 2014-09-24 (×2): 150 mg via ORAL
  Filled 2014-09-23 (×5): qty 1

## 2014-09-23 MED ORDER — IOHEXOL 300 MG/ML  SOLN
25.0000 mL | INTRAMUSCULAR | Status: AC
  Administered 2014-09-23 (×2): 25 mL via ORAL

## 2014-09-23 MED ORDER — LORAZEPAM 2 MG/ML IJ SOLN: 2.0000 mg | INTRAMUSCULAR | Status: DC | PRN

## 2014-09-23 MED ORDER — ALBUTEROL SULFATE (2.5 MG/3ML) 0.083% IN NEBU: 2.5000 mg | INHALATION_SOLUTION | RESPIRATORY_TRACT | Status: DC | PRN

## 2014-09-23 MED ORDER — IPRATROPIUM-ALBUTEROL 0.5-2.5 (3) MG/3ML IN SOLN: 3.0000 mL | RESPIRATORY_TRACT | Status: DC | PRN

## 2014-09-23 MED ORDER — OXYCODONE-ACETAMINOPHEN 10-325 MG PO TABS: 1.0000 | ORAL_TABLET | Freq: Three times a day (TID) | ORAL | Status: DC | PRN

## 2014-09-23 MED ORDER — OXYCODONE HCL 5 MG PO TABS: 5.0000 mg | ORAL_TABLET | Freq: Three times a day (TID) | ORAL | Status: DC | PRN

## 2014-09-23 MED ORDER — AMLODIPINE BESYLATE 10 MG PO TABS
10.0000 mg | ORAL_TABLET | Freq: Every day | ORAL | Status: DC
  Administered 2014-09-24: 10 mg via ORAL
  Filled 2014-09-23 (×2): qty 1

## 2014-09-23 MED ORDER — OXYCODONE-ACETAMINOPHEN 5-325 MG PO TABS
1.0000 | ORAL_TABLET | Freq: Three times a day (TID) | ORAL | Status: DC | PRN
  Administered 2014-09-23 – 2014-09-24 (×3): 1 via ORAL
  Filled 2014-09-23 (×3): qty 1

## 2014-09-23 MED ORDER — PANTOPRAZOLE SODIUM 40 MG PO TBEC
40.0000 mg | DELAYED_RELEASE_TABLET | Freq: Every day | ORAL | Status: DC
  Administered 2014-09-24: 40 mg via ORAL
  Filled 2014-09-23: qty 1

## 2014-09-23 MED ORDER — SODIUM CHLORIDE 0.9 % IV BOLUS (SEPSIS)
1000.0000 mL | Freq: Once | INTRAVENOUS | Status: AC
  Administered 2014-09-23: 1000 mL via INTRAVENOUS

## 2014-09-23 MED ORDER — SODIUM CHLORIDE 0.9 % IV SOLN
INTRAVENOUS | Status: DC
  Administered 2014-09-23 – 2014-09-24 (×3): via INTRAVENOUS

## 2014-09-23 MED ORDER — ASPIRIN EC 81 MG PO TBEC
81.0000 mg | DELAYED_RELEASE_TABLET | Freq: Every day | ORAL | Status: DC
  Administered 2014-09-24: 81 mg via ORAL
  Filled 2014-09-23 (×2): qty 1

## 2014-09-23 MED ORDER — AMITRIPTYLINE HCL 10 MG PO TABS
10.0000 mg | ORAL_TABLET | Freq: Every day | ORAL | Status: DC
  Administered 2014-09-23: 10 mg via ORAL
  Filled 2014-09-23 (×2): qty 1

## 2014-09-23 MED ORDER — HEPARIN SODIUM (PORCINE) 5000 UNIT/ML IJ SOLN
5000.0000 [IU] | Freq: Three times a day (TID) | INTRAMUSCULAR | Status: DC
  Administered 2014-09-23 – 2014-09-24 (×3): 5000 [IU] via SUBCUTANEOUS
  Filled 2014-09-23 (×6): qty 1

## 2014-09-23 MED ORDER — ZOLPIDEM TARTRATE 5 MG PO TABS
10.0000 mg | ORAL_TABLET | Freq: Every evening | ORAL | Status: DC | PRN
  Administered 2014-09-24: 10 mg via ORAL
  Filled 2014-09-23 (×2): qty 2

## 2014-09-23 MED ORDER — ONDANSETRON HCL 4 MG PO TABS: 4.0000 mg | ORAL_TABLET | Freq: Four times a day (QID) | ORAL | Status: DC | PRN

## 2014-09-23 MED ORDER — BACLOFEN 10 MG PO TABS
10.0000 mg | ORAL_TABLET | Freq: Three times a day (TID) | ORAL | Status: DC
  Administered 2014-09-23 – 2014-09-24 (×3): 10 mg via ORAL
  Filled 2014-09-23 (×6): qty 1

## 2014-09-23 MED ORDER — HYDRALAZINE HCL 20 MG/ML IJ SOLN: 5.0000 mg | INTRAMUSCULAR | Status: DC | PRN

## 2014-09-23 MED ORDER — ACETAMINOPHEN 325 MG PO TABS: 650.0000 mg | ORAL_TABLET | Freq: Four times a day (QID) | ORAL | Status: DC | PRN

## 2014-09-23 NOTE — Evaluation (Signed)
Physical Therapy Evaluation Patient Details Name: Aaron [name] MRN: [id number] DOB: 14-Apr-1956 Today's Date: 09/23/2014   History of Present Illness  Patient is a 59 yo single male with PMH of hypertension, hyperlipidemia, GERD, depression, anxiety, polycystic kidney disease, hepatitis C, migraine headache, chronic back pain, history of diverticulitis, kidney stone, COPD, left leg PVD (post status of stent placement 09/20/14), who presents with syncope, abdominal pain and leg weakness.  Clinical Impression  Patient independent with mobility.  Only mild balance deficits lt leg secondary to recent procedure and patient reported Lt foot numbness.  PT to sign off.  No follow up services or equipment needed.    Follow Up Recommendations No PT follow up    Equipment Recommendations  None recommended by PT    Recommendations for Other Services       Precautions / Restrictions Precautions Precautions: Other (comment) (seizure) Precaution Comments: h/o seizures Restrictions Weight Bearing Restrictions: No      Mobility  Bed Mobility Overal bed mobility: Independent             General bed mobility comments: without rail, flat bed  Transfers Overall transfer level: Independent Equipment used: None                Ambulation/Gait Ambulation/Gait assistance: Independent Ambulation Distance (Feet): 400 Feet Assistive device: None Gait Pattern/deviations: WFL(Within Functional Limits)     General Gait Details: carried his own IV pole  Stairs            Wheelchair Mobility    Modified Rankin (Stroke Patients Only)       Balance Overall balance assessment: Independent                               Standardized Balance Assessment Standardized Balance Assessment :  (Tandem Lt post 7.68s, Rt 30 s. SLS Lt 4.74, Rt 12.42 s)           Pertinent Vitals/Pain Pain Assessment: 0-10 Pain Score: 2  Pain Location: stomach Pain Descriptors /  Indicators: Cramping Pain Intervention(s): Monitored during session    Home Living Family/patient expects to be discharged to:: Private residence Living Arrangements: Children Available Help at Discharge: Family;Available PRN/intermittently Type of Home: House Home Access: Level entry     Home Layout: One level Home Equipment: None Additional Comments: Dau is intermittent with being at home, variable schedule    Prior Function Level of Independence: Independent         Comments: Pt does his own cooking, cleaning, laundry, on Disabiilty since 2006 after falling thru roof with neck/back injuries     Hand Dominance   Dominant Hand: Right    Extremity/Trunk Assessment   Upper Extremity Assessment: Overall WFL for tasks assessed (4+/5 bil shoulder flex, abduct, elbow flex/ext)           Lower Extremity Assessment: Overall WFL for tasks assessed (4+/5 bil hip flex, knee ext, ankle DF)      Cervical / Trunk Assessment: Normal  Communication   Communication: No difficulties  Cognition Arousal/Alertness: Awake/alert Behavior During Therapy: WFL for tasks assessed/performed Overall Cognitive Status: Within Functional Limits for tasks assessed                      General Comments      Exercises        Assessment/Plan    PT Assessment Patent does not need any further PT services  PT  Diagnosis Generalized weakness;Acute pain   PT Problem List    PT Treatment Interventions     PT Goals (Current goals can be found in the Care Plan section) Acute Rehab PT Goals Patient Stated Goal: stay out of the bed as much as possible PT Goal Formulation: All assessment and education complete, DC therapy    Frequency     Barriers to discharge        Co-evaluation               End of Session Equipment Utilized During Treatment: Gait belt Activity Tolerance: Patient tolerated treatment well;No increased pain Patient left: in chair;with call bell/phone  within reach Nurse Communication: Mobility status         Time: [id number] PT Time Calculation (min) (ACUTE ONLY): 26 min   Charges:   PT Evaluation $Initial PT Evaluation Tier I: 1 Procedure     PT G CodesMalka So, PT [phone]  [place] 09/23/2014, 3:26 PM

## 2014-09-23 NOTE — ED Notes (Signed)
Partial report given to rn on 3e  Pot being given a different bed

## 2014-09-23 NOTE — Progress Notes (Signed)
Utilization Review completed. Kimyatta Lecy RN BSN CM 

## 2014-09-23 NOTE — ED Notes (Signed)
Report given to rn on the floor

## 2014-09-23 NOTE — Procedures (Signed)
ELECTROENCEPHALOGRAM REPORT   Patient: Aaron [name]       Room #: P2192009 EEG No. ID: [id number] Age: 59 y.o.        Sex: male Referring Physician: Blaine Hamper Report Date:  09/23/2014        Interpreting Physician: Alexis Goodell  History: Aaron [name] is an 59 y.o. male admitted with a history of multiple episodes of syncope  Medications:  Scheduled: . amitriptyline  10 mg Oral QHS  . amLODipine  10 mg Oral Daily  . aspirin EC  81 mg Oral Daily  . baclofen  10 mg Oral TID  . budesonide-formoterol  2 puff Inhalation BID  . buPROPion  150 mg Oral BID WC  . clopidogrel  75 mg Oral Daily  . dextromethorphan-guaiFENesin  1 tablet Oral BID  . heparin  5,000 Units Subcutaneous 3 times per day  . ipratropium-albuterol  3 mL Nebulization Q4H  . pantoprazole  40 mg Oral Daily  . sodium chloride  3 mL Intravenous Q12H    Conditions of Recording:  This is a 16 channel EEG carried out with the patient in the awake, drowsy and asleep states.  Description:  The waking background activity is only noted briefly since he patient spends most of the recording drowsy and asleep.  It consists of a low voltage, symmetrical, fairly well organized, 10 Hz alpha activity, seen from the parieto-occipital and posterior temporal regions.  Low voltage fast activity, poorly organized, is seen anteriorly and is at times superimposed on more posterior regions.  A mixture of theta and alpha rhythms are seen from the central and temporal regions. The patient drowses with slowing to irregular, low voltage theta and beta activity.   The patient goes in to a light sleep with symmetrical sleep spindles, vertex central sharp transients and irregular slow activity.   No epileptiform activity is noted.   Hyperventilation was not performed.  Intermittent photic stimulation was performed but failed to illicit any change in the tracing.     IMPRESSION: Normal electroencephalogram, awake, asleep and with activation procedures.  There are no focal lateralizing or epileptiform features.   Alexis Goodell, MD [facility] [phone] 09/23/2014, 9:54 AM

## 2014-09-23 NOTE — ED Notes (Signed)
Admitting doctor at the bedside 

## 2014-09-23 NOTE — Progress Notes (Signed)
EEG Completed; Results Pending  

## 2014-09-23 NOTE — H&P (Signed)
[facility] History and Physical  Aaron [name] [id number] DOB: 03/14/1956 DOA: 09/22/2014  Referring physician: ED physician PCP: Celedonio Savage, MD  Specialists:   Chief Complaint: Syncope, nausea, vomiting, abdominal pain, jerking  HPI: Aaron [name] is a 59 y.o. male with PMH of hypertension, hyperlipidemia, GERD, depression, anxiety, polycystic kidney disease, hepatitis C, migraine headache, chronic back pain, history of diverticulitis, kidney stone, COPD, left leg PVD (post status of stent placement 09/20/14), who presents with syncope, abdominal pain or jerking.  Patient reports that he passed out for 5 times since yesterday. Each time lasted for about 5-6 minutes. He did not have head injury, no chest pain. He has mild cough and mild shortness of breath due to COPD, which has not changed. Patient reports that because of her lower back pain, he had local injection by orthopedic surgeon 3 weeks ago. After that, he started having jerking movement, involving his feet and hands, sometimes involving whole body. He also reports that he always has palpitation, which has been going on for years. He reports that he has left leg pain and tingling sensations, which is likely due to PVD, for which he had stent placement on 09/20/14.  Patient also reports having nausea, vomiting and abdominal pain in the past 2 days. He does not have diarrhea. He vomited 4-5 times yesterday. His abdominal pain is located in the lower abdomen on both sides. Does not have symptoms of UTI.  In ED, patient was found to have WBC 11.1, negative troponin, AoCKD-II, negative chest x-ray, negative CT head for acute abnormalities. Patient is admitted to inpatient for further evaluation and treatment. Neurology was consulted. Vascular surgery was also consulted by ED.  Where does patient live?   At home    Can patient participate in ADLs?  Yes    Review of Systems:   General: no fevers, chills, no changes in body weight,  has poor appetite, has fatigue HEENT: no blurry vision, hearing changes or sore throat Pulm: has dyspnea, coughing, no wheezing CV: no chest pain, has palpitations Abd: has nausea, vomiting, abdominal pain, No diarrhea, constipation GU: no dysuria, burning on urination, increased urinary frequency, hematuria  Ext: has left leg pain, no edema Neuro: no unilateral weakness, no vision change or hearing loss Skin: no rash MSK: No muscle spasm, no deformity, no limitation of range of movement in spin Heme: No easy bruising.  Travel history: No recent long distant travel.  Allergy:  Allergies  Allergen Reactions  . Codeine Itching and Nausea Only    Past Medical History  Diagnosis Date  . Other and unspecified hyperlipidemia   . Unspecified cardiovascular disease   . Unspecified essential hypertension   . Polycystic kidney, unspecified type   . Chronic airway obstruction, not elsewhere classified   . Unspecified viral hepatitis C without hepatic coma   . Esophageal reflux   . Other, mixed, or unspecified nondependent drug abuse, unspecified   . Calculus of kidney   . Anxiety state, unspecified   . Migraine, unspecified, without mention of intractable migraine without mention of status migrainosus   . Backache, unspecified   . Other B-complex deficiencies   . Other malaise and fatigue   . Diverticulitis     Past Surgical History  Procedure Laterality Date  . Back surgery    . Bunionectomy    . Cardiac catheterization    . Peripheral vascular catheterization N/A 09/20/2014    Procedure: Abdominal Aortogram;  Surgeon: Serafina Mitchell, MD;  Location:  [facility];  Service: Cardiovascular;  Laterality: N/A;    Social History:  reports that he quit smoking about 5 weeks ago. His smoking use included Cigarettes. He has a 47 pack-year smoking history. He has never used smokeless tobacco. He reports that he does not drink alcohol or use illicit drugs.  Family History:  Family  History  Problem Relation Age of Onset  . Coronary artery disease      family history  . Cancer Maternal Grandfather 27     colon cancer  . Cancer Cousin      kidney cancer  . Cancer Sister      male cancer     Prior to Admission medications   Medication Sig Start Date End Date Taking? Authorizing Provider  albuterol (PROVENTIL HFA) 108 (90 BASE) MCG/ACT inhaler Inhale 2 puffs into the lungs every 6 (six) hours as needed for wheezing or shortness of breath.    Yes Historical Provider, MD  amitriptyline (ELAVIL) 10 MG tablet Take 10 mg by mouth at bedtime.   Yes Historical Provider, MD  amLODipine (NORVASC) 10 MG tablet Take 10 mg by mouth daily.  08/28/14  Yes Historical Provider, MD  aspirin 81 MG tablet Take 81 mg by mouth daily.     Yes Historical Provider, MD  baclofen (LIORESAL) 10 MG tablet Take 10 mg by mouth 3 (three) times daily.  09/12/14  Yes Historical Provider, MD  budesonide-formoterol (SYMBICORT) 80-4.5 MCG/ACT inhaler Inhale 2 puffs into the lungs 2 (two) times daily.     Yes Historical Provider, MD  buPROPion (WELLBUTRIN SR) 150 MG 12 hr tablet Take 150 mg by mouth 2 (two) times daily. 09/14/14  Yes Historical Provider, MD  clopidogrel (PLAVIX) 75 MG tablet Take 1 tablet (75 mg total) by mouth daily. 09/20/14  Yes Serafina Mitchell, MD  Diclofenac Sodium 3 % GEL Apply 1 application topically 2 (two) times daily as needed (muscle pain).  09/04/14  Yes Historical Provider, MD  lisinopril (PRINIVIL,ZESTRIL) 20 MG tablet Take 20 mg by mouth daily.  08/28/14  Yes Historical Provider, MD  omeprazole (PRILOSEC) 20 MG capsule Take 20 mg by mouth daily.     Yes Historical Provider, MD  oxyCODONE-acetaminophen (PERCOCET) 10-325 MG per tablet Take 1 tablet by mouth every 8 (eight) hours as needed for pain.    Yes Historical Provider, MD  tiotropium (SPIRIVA) 18 MCG inhalation capsule Place 18 mcg into inhaler and inhale daily.     Yes Historical Provider, MD    Physical Exam: Filed  Vitals:   09/22/14 2030 09/22/14 2045 09/22/14 2233 09/22/14 2348  BP: 166/96 162/95    Pulse: 64 71  88  Temp:   98.1 F (36.7 C)   TempSrc:      Resp: 15 15  19   SpO2: 97% 97%  96%   General: Not in acute distress HEENT:       Eyes: PERRL, EOMI, no scleral icterus.       ENT: No discharge from the ears and nose, no pharynx injection, no tonsillar enlargement.        Neck: No JVD, no bruit, no mass felt. Heme: No neck lymph node enlargement. Cardiac: S1/S2, RRR, No murmurs, No gallops or rubs. Pulm: o rales, wheezing, rhonchi or rubs. Abd: Soft, nondistended, mild tenderness over lower abdomen on both sides, no rebound pain, no organomegaly, BS present. Ext: No pitting leg edema bilaterally. 2+DP/PT pulse bilaterally. Musculoskeletal: No joint deformities, No joint redness or warmth,  no limitation of ROM in spin. Skin: No rashes.  Neuro: Alert, oriented X3, cranial nerves II-XII grossly intact, muscle strength 5/5 in all extremities, sensation to light touch intact. Brachial reflex 1+ bilaterally. Knee reflex 1+ bilaterally. Negative Babinski's sign. Normal finger to nose test. Psych: Patient is not psychotic, no suicidal or hemocidal ideation.  Labs on Admission:  Basic Metabolic Panel:  Recent Labs Lab 09/20/14 0853 09/22/14 1920  NA 137 138  K 4.0 5.0  CL 104 103  CO2  --  24  GLUCOSE 109* 116*  BUN 21* 15  CREATININE 1.80* 1.89*  CALCIUM  --  8.9   Liver Function Tests:  Recent Labs Lab 09/22/14 2039  AST 23  ALT 14*  ALKPHOS 108  BILITOT 0.7  PROT 6.7  ALBUMIN 3.0*   No results for input(s): LIPASE, AMYLASE in the last 168 hours. No results for input(s): AMMONIA in the last 168 hours. CBC:  Recent Labs Lab 09/20/14 0853 09/22/14 1920  WBC  --  11.1*  NEUTROABS  --  10.0*  HGB 14.6 13.6  HCT 43.0 40.1  MCV  --  94.8  PLT  --  234   Cardiac Enzymes:  Recent Labs Lab 09/22/14 1920  TROPONINI <0.03    BNP (last 3 results) No results for  input(s): BNP in the last 8760 hours.  ProBNP (last 3 results) No results for input(s): PROBNP in the last 8760 hours.  CBG: No results for input(s): GLUCAP in the last 168 hours.  Radiological Exams on Admission: Dg Chest 2 View  09/22/2014   CLINICAL DATA:  Chronic cough and congestion. Nausea and vomiting. Syncope. Initial encounter.  EXAM: CHEST  2 VIEW  COMPARISON:  Chest radiograph performed 05/01/2008  FINDINGS: The lungs are well-aerated and clear. There is no evidence of focal opacification, pleural effusion or pneumothorax.  The heart is normal in size; the mediastinal contour is within normal limits. No acute osseous abnormalities are seen.  IMPRESSION: No acute cardiopulmonary process seen.   Electronically Signed   By: Garald Balding M.D.   On: 09/22/2014 19:56   Ct Head Wo Contrast  09/22/2014   CLINICAL DATA:  Syncopal episodes. Recent stent placement. Initial encounter.  EXAM: CT HEAD WITHOUT CONTRAST  TECHNIQUE: Contiguous axial images were obtained from the base of the skull through the vertex without intravenous contrast.  COMPARISON:  None.  FINDINGS: There is no evidence of acute intracranial hemorrhage, parenchymal mass lesion, brain edema or hydrocephalus. Prominent CSF space anterolateral to the right temporal lobe is associated with adjacent calvarial scalloping and most likely represents an arachnoid cyst measuring up to 3.0 x 2.6 cm on image 13. No acute extra-axial fluid collection or midline shift demonstrated. Intracranial vascular calcifications noted.  The visualized right maxillary sinus is completely opacified. The visualized paranasal sinuses, mastoid air cells and middle ears are otherwise clear. The calvarium is intact.  IMPRESSION: 1. No acute intracranial findings. 2. Arachnoid cyst within the right middle cranial fossa.   Electronically Signed   By: Richardean Sale M.D.   On: 09/22/2014 22:01    EKG: Independently reviewed.  Abnormal findings: Q wave in V1-V3,  which is similar to previous EKG on 09/20/14.  Assessment/Plan Principal Problem:   SYNCOPE Active Problems:   Dysthymic disorder   Essential hypertension   Esophageal reflux   Acute-on-chronic kidney injury   PAD (peripheral artery disease)   COPD (chronic obstructive pulmonary disease)  SYNCOPE: Etiology is not clear. CT head is negative  for acute abnormalities. Patient has jerking movement, concerning for seizure. Neurology was consulted. Another differential diagnosis is cardiac arrhythmia, given patient has palpitation for a long time. His EKG showed old Q waves in V1 to V3, indicating possible silent myocardial infarction in the past. -will admit to tele bed -ortho static vitals -2d echo and TSH -EEG -uds, hiv ab and alcohol level -neuro check q4h -seizure precaution -prn ativan for seizure -follow neurology Recx.  Nausea, vomiting, abdominal pain: Etiology is not clear. Patient has a history of diverticulitis, needs to rule out this possibility. -CT-abd/pelvis w/o contrast -Zofran for nausea and morphine for pain -Will get C. difficile PCR if patient develops diarrhea (not ordered) -IVF: 1L NS and then 100 cc/h - follow up blood culture given leukocytosis  Dysthymic disorder: Stable. No suicidal or homicidal ideations. -Amitriptyline, Wellbutrin  Hypertension: -Hold lisinopril due to AoCKD-II -continue amlodipine -IV hydralazine when necessary  COPD: Stable. No signs of acute exacerbation. Chest x-ray is negative. -DuoNeb and albuterol nebulizers -Continue Symbicort -Mucinex for cough  Esophageal reflux: -Protonix  AoCKD-II: Baseline Cre is 1.2-1.5 , his Cre is 1.89 on admission. Likely due to prerenal secondary to dehydration and continuation of ACEI. -IVF as above -Check FeNa  -follow up CT-abd/pelvis to r/o hydronephrosis -Follow up renal function by BMP --Hold lisinopril   PAD (peripheral artery disease): S/p of aortogram and left superficial femoral  artery stent intervention by Dr. Trula Slade 2 days ago. He has has excellent normal flow to his foot per Dr. Donnetta Hutching. Per dr. Donnetta Hutching, there is no correlation for relationship between his new onset of syncopal episodes and his recent treatment. -Appreciate Dr. Luther Parody consultation -continue aspirin and Plavix -Percocet for pain   DVT ppx: SQ Heparin  Code Status: Full code Family Communication: None at bed side.        Disposition Plan: Admit to inpatient   Date of Service 09/23/2014    Ivor Costa [facility] Pager [phone]  If 7PM-7AM, please contact night-coverage [email or web address] Password [facility] 09/23/2014, 12:26 AM

## 2014-09-23 NOTE — Consult Note (Signed)
NEURO HOSPITALIST CONSULT NOTE   Referring physician: Dr Kenney Houseman Reason for Consult: syncope versus seizures  HPI:                                                                                                                                          Aaron [name] is an 59 y.o. male with a past medical history that is relevant for chronic airway obstruction, hepatitis C, cardiovascular disease, migraine, and anxiety, s/p left superficial femoral artery angioplasty of a complete occlusion with angioplasty and stenting 2 days ago, brought here via EMS from home for evaluation of syncope. Aaron [name] reports that he had " passed out" before, many years go " but for some reason it came back again". He reports total of 5 unwitnessed episodes of " passing out and waking out on the floor" in the past 2 days. Patient said that he recalls " getting a cold chill sensation all over me, then my body starts jerking, and I wake up in the floor for reasons that I can not explain". He said that he nobody is around to explain to him what had happened or for how long he is out , but when he regains consciousness he is confused about not knowing why he is on the floor, with headache and blurred vision. Denies bladder or bowel incontinence or tongue biting. No recent use of narcotics, illicit drugs, alcohol intake, use of benzodiazepines or any new medication. Denies vertigo, double vision, unsteadiness, focal weakness, slurred speech, language or vision impairment. No history of febrile seizures, CNS infections, severe head injury, or stroke. CT head was personally reviewed and showed no acute abnormality. Serologies are unimpressive.   Past Medical History  Diagnosis Date  . Other and unspecified hyperlipidemia   . Unspecified cardiovascular disease   . Unspecified essential hypertension   . Polycystic kidney, unspecified type   . Chronic airway obstruction, not elsewhere classified   .  Unspecified viral hepatitis C without hepatic coma   . Esophageal reflux   . Other, mixed, or unspecified nondependent drug abuse, unspecified   . Calculus of kidney   . Anxiety state, unspecified   . Migraine, unspecified, without mention of intractable migraine without mention of status migrainosus   . Backache, unspecified   . Other B-complex deficiencies   . Other malaise and fatigue   . Diverticulitis     Past Surgical History  Procedure Laterality Date  . Back surgery    . Bunionectomy    . Cardiac catheterization    . Peripheral vascular catheterization N/A 09/20/2014    Procedure: Abdominal Aortogram;  Surgeon: Serafina Mitchell, MD;  Location: [facility];  Service: Cardiovascular;  Laterality: N/A;    Family History  Problem Relation Age of Onset  .  Coronary artery disease      family history  . Cancer Maternal Grandfather 78     colon cancer  . Cancer Cousin      kidney cancer  . Cancer Sister      male cancer    Family History: no epilepsy, brain tumors, MS, or brain aneurysms.   Social History:  reports that he quit smoking about 5 weeks ago. His smoking use included Cigarettes. He has a 47 pack-year smoking history. He has never used smokeless tobacco. He reports that he does not drink alcohol or use illicit drugs.  Allergies  Allergen Reactions  . Codeine Itching and Nausea Only    MEDICATIONS:                                                                                                                     I have reviewed the patient's current medications.   ROS:                                                                                                                                       History obtained from chart review and the patient  General ROS: negative for - chills, fatigue, fever, night sweats, weight gain or weight loss Psychological ROS: negative for - behavioral disorder, hallucinations, memory difficulties, mood swings or  suicidal ideation Ophthalmic ROS: negative for - blurry vision, double vision, eye pain or loss of vision ENT ROS: negative for - epistaxis, nasal discharge, oral lesions, sore throat, tinnitus or vertigo Allergy and Immunology ROS: negative for - hives or itchy/watery eyes Hematological and Lymphatic ROS: negative for - bleeding problems, bruising or swollen lymph nodes Endocrine ROS: negative for - galactorrhea, hair pattern changes, polydipsia/polyuria or temperature intolerance Respiratory ROS: negative for - cough, hemoptysis, shortness of breath or wheezing Cardiovascular ROS: negative for - chest pain, dyspnea on exertion, edema or irregular heartbeat Gastrointestinal ROS: negative for - abdominal pain, diarrhea, hematemesis, nausea/vomiting or stool incontinence Genito-Urinary ROS: negative for - dysuria, hematuria, incontinence or urinary frequency/urgency Musculoskeletal ROS: negative for - joint swelling or muscular weakness Neurological ROS: as noted in HPI Dermatological ROS: negative for rash and skin lesion changes  Physical exam: pleasant male in no apparent distress. Blood pressure 170/94, pulse 67, temperature 98.1 F (36.7 C), temperature source Oral, resp. rate 16, SpO2 95 %. Head: normocephalic. Neck: supple, no bruits, no JVD. Cardiac: no murmurs. Lungs:  clear. Abdomen: soft, no tender, no mass. Extremities: left ankle edema. Skin: no rash  Neurologic Examination:                                                                                                       General: Mental Status: Alert, oriented, thought content appropriate.  Speech fluent without evidence of aphasia.  Able to follow 3 step commands without difficulty. Cranial Nerves: II: Discs flat bilaterally; Visual fields grossly normal, pupils equal, round, reactive to light and accommodation III,IV, VI: ptosis not present, extra-ocular motions intact bilaterally V,VII: smile symmetric, facial light  touch sensation normal bilaterally VIII: hearing normal bilaterally IX,X: uvula rises symmetrically XI: bilateral shoulder shrug XII: midline tongue extension without atrophy or fasciculations  Motor: Right : Upper extremity   5/5    Left:     Upper extremity   5/5  Lower extremity   5/5     Lower extremity   5/5 Tone and bulk:normal tone throughout; no atrophy noted Sensory: Pinprick and light touch intact throughout, bilaterally Deep Tendon Reflexes:  1+ all over Plantars: Right: downgoing   Left: downgoing Cerebellar: normal finger-to-nose,  normal heel-to-shin test Gait:  No tested due to multiple leads.   No results found for: CHOL  Results for orders placed or performed during the hospital encounter of 09/22/14 (from the past 48 hour(s))  Basic metabolic panel     Status: Abnormal   Collection Time: 09/22/14  7:20 PM  Result Value Ref Range   Sodium 138 135 - 145 mmol/L   Potassium 5.0 3.5 - 5.1 mmol/L   Chloride 103 101 - 111 mmol/L   CO2 24 22 - 32 mmol/L   Glucose, Bld 116 (H) 65 - 99 mg/dL   BUN 15 6 - 20 mg/dL   Creatinine, Ser 1.89 (H) 0.61 - 1.24 mg/dL   Calcium 8.9 8.9 - 10.3 mg/dL   GFR calc non Af Amer 38 (L) >60 mL/min   GFR calc Af Amer 43 (L) >60 mL/min    Comment: (NOTE) The eGFR has been calculated using the CKD EPI equation. This calculation has not been validated in all clinical situations. eGFR's persistently <60 mL/min signify possible Chronic Kidney Disease.    Anion gap 11 5 - 15  Troponin I     Status: None   Collection Time: 09/22/14  7:20 PM  Result Value Ref Range   Troponin I <0.03 <0.031 ng/mL    Comment:        NO INDICATION OF MYOCARDIAL INJURY.   CBC with Differential     Status: Abnormal   Collection Time: 09/22/14  7:20 PM  Result Value Ref Range   WBC 11.1 (H) 4.0 - 10.5 K/uL   RBC 4.23 4.22 - 5.81 MIL/uL   Hemoglobin 13.6 13.0 - 17.0 g/dL   HCT 40.1 39.0 - 52.0 %   MCV 94.8 78.0 - 100.0 fL   MCH 32.2 26.0 - 34.0 pg    MCHC 33.9 30.0 - 36.0 g/dL   RDW 14.2 11.5 - 15.5 %   Platelets  234 150 - 400 K/uL   Neutrophils Relative % 90 (H) 43 - 77 %   Neutro Abs 10.0 (H) 1.7 - 7.7 K/uL   Lymphocytes Relative 6 (L) 12 - 46 %   Lymphs Abs 0.7 0.7 - 4.0 K/uL   Monocytes Relative 4 3 - 12 %   Monocytes Absolute 0.5 0.1 - 1.0 K/uL   Eosinophils Relative 0 0 - 5 %   Eosinophils Absolute 0.0 0.0 - 0.7 K/uL   Basophils Relative 0 0 - 1 %   Basophils Absolute 0.0 0.0 - 0.1 K/uL  Hepatic function panel     Status: Abnormal   Collection Time: 09/22/14  8:39 PM  Result Value Ref Range   Total Protein 6.7 6.5 - 8.1 g/dL   Albumin 3.0 (L) 3.5 - 5.0 g/dL   AST 23 15 - 41 U/L   ALT 14 (L) 17 - 63 U/L   Alkaline Phosphatase 108 38 - 126 U/L   Total Bilirubin 0.7 0.3 - 1.2 mg/dL   Bilirubin, Direct <0.1 (L) 0.1 - 0.5 mg/dL   Indirect Bilirubin NOT CALCULATED 0.3 - 0.9 mg/dL    Dg Chest 2 View  09/22/2014   CLINICAL DATA:  Chronic cough and congestion. Nausea and vomiting. Syncope. Initial encounter.  EXAM: CHEST  2 VIEW  COMPARISON:  Chest radiograph performed 05/01/2008  FINDINGS: The lungs are well-aerated and clear. There is no evidence of focal opacification, pleural effusion or pneumothorax.  The heart is normal in size; the mediastinal contour is within normal limits. No acute osseous abnormalities are seen.  IMPRESSION: No acute cardiopulmonary process seen.   Electronically Signed   By: Garald Balding M.D.   On: 09/22/2014 19:56   Ct Head Wo Contrast  09/22/2014   CLINICAL DATA:  Syncopal episodes. Recent stent placement. Initial encounter.  EXAM: CT HEAD WITHOUT CONTRAST  TECHNIQUE: Contiguous axial images were obtained from the base of the skull through the vertex without intravenous contrast.  COMPARISON:  None.  FINDINGS: There is no evidence of acute intracranial hemorrhage, parenchymal mass lesion, brain edema or hydrocephalus. Prominent CSF space anterolateral to the right temporal lobe is associated with  adjacent calvarial scalloping and most likely represents an arachnoid cyst measuring up to 3.0 x 2.6 cm on image 13. No acute extra-axial fluid collection or midline shift demonstrated. Intracranial vascular calcifications noted.  The visualized right maxillary sinus is completely opacified. The visualized paranasal sinuses, mastoid air cells and middle ears are otherwise clear. The calvarium is intact.  IMPRESSION: 1. No acute intracranial findings. 2. Arachnoid cyst within the right middle cranial fossa.   Electronically Signed   By: Richardean Sale M.D.   On: 09/22/2014 22:01   Assessment/Plan: 59 y.o with new onset of recurrent, paroxysmal, stereotypic events with a semiology described above. Main concern for seizures at this moment. EEG in am. Defer AED for now but can certainly consider initiating a trial of anticonvulsant despite normal EEG if cardiac work negative. Will follow up.  Dorian Pod, MD 09/23/2014, 2:07 AM  [facility]

## 2014-09-23 NOTE — Progress Notes (Addendum)
PROGRESS NOTE  Aaron [name] [id number] DOB: [birth year] DOA: 09/22/2014 PCP: Celedonio Savage, MD  Brief history 59 year old male with a history of hypertension, hyperlipidemia, depression, polycystic kidney disease, CKD stage II presents with 5 episodes of syncope over a period of 48 hours. He states that 4 of the 5 episodes were related to episodes of emesis. However, one of the episodes occur while he was sitting on the couch, and the next thing he recalled he was lying on the floor. He denied any urine or bowel incontinence. There is no family history of sudden cardiac death; however, the patient has noted Increasing dyspnea on exertion and decreasing endurance and exercise tolerance over the last 1-2 months. He denies any diarrhea,  hematemesis, hematochezia, melena but does complain of lower abdominal pain which she states is better 24 hours after admission but not completely resolved.  the patient denies any alcohol, NSAIDs, a liquid drug use. He smokes 2 packs per day 30 years. The patient had a left superficial femoral artery stent placed by Dr. Trula Slade on 09/20/14. Since then, the patient was placed on aspirin and Plavix. In addition, the patient describes jerking type movements that we came up from sleep for the past 2-3 weeks. Assessment/Plan: Syncope -while pt's history suggests a vasovagal mediated response, his telemetry shows short runs of wide complex rhythm -pt also complains of some worsening exertional dyspnea and stamina/endurance over past 1-2 months -consult cardiology -remain on tele -echo -EEG normal -Patient has had a Holter monitor placed on 09/12/2010 which only revealed sinus vs artifact bradycardia, sinus tachycardia, and sinus arrhythmia with PVCs Acute on chronic renal failure (CKD 2) -Secondary to volume depletion in the setting of lisinopril use -Continue IV fluids -Baseline creatinine 1.2-1.5 -Renal ultrasound negative for hydronephrosis -Discontinue  lisinopril Myoclonic type jerks -doubt seizure -neurology is following -predominantly in sleep waking him up Vomiting and abdominal pain -09/23/2014 CT abdomen and pelvis showed small right-sided femoral artery hematoma without pseudoaneurysm. There was no other acute intra-abdominal process Hypertension -Continue amlodipine -Hold lisinopril COPD -Stable on room air -Continue Symbicort -PRN duo nebs Peripheral arterial disease -Appreciate Dr. Donnetta Hutching evaluation -continue ASA and plavix Depression -Continue Wellbutrin Chronic back pain with previous discectomy L2-L3 -Continue baclofen, Elavil,   Family Communication:   Pt at beside Disposition Plan:   Home when medically stable       Procedures/Studies: Ct Abdomen Pelvis Wo Contrast  09/23/2014   CLINICAL DATA:  Bilateral inguinal pain following left SFA stent placement  EXAM: CT ABDOMEN AND PELVIS WITHOUT CONTRAST  TECHNIQUE: Multidetector CT imaging of the abdomen and pelvis was performed following the standard protocol without IV contrast.  COMPARISON:  03/03/2010  FINDINGS: Lung bases are free of acute infiltrate or sizable effusion.  The liver demonstrates multiple small cystic lesions. The gallbladder, spleen, adrenal glands and pancreas are within normal limits.  The kidneys demonstrate multiple cysts of varying density consistent with polycystic kidney disease. These changes are similar to that seen on the prior examination. No calculi or urinary tract obstructive changes are noted.  The appendix is within normal limits. Mild diverticular change is noted without evidence of diverticulitis. The bladder is well distended. No pelvic mass lesion or retroperitoneal hematoma is noted. Diffuse aortoiliac calcifications are noted.  In the right common femoral artery there is a small perivascular area of increased attenuation seen on image number 90 consistent with a small hematoma. No definitive pseudoaneurysm is seen. Posterior to  the  right common femoral artery and vein, there is a fluid collection along the course of the right psoas muscle likely representing iliopsoas bursitis. No other focal abnormality is seen. The bony structures are within normal limits  IMPRESSION: Changes consistent with the recent catheterization in the right common femoral artery with a small hematoma identified. No retroperitoneal extension is seen.  Changes consistent with polycystic kidney disease and multiple liver cysts stable from the prior study.  Changes suggestive of right iliopsoas bursitis. This may contribute to the patient's clinical discomfort.   Electronically Signed   By: Inez Catalina M.D.   On: 09/23/2014 07:27   Dg Chest 2 View  09/22/2014   CLINICAL DATA:  Chronic cough and congestion. Nausea and vomiting. Syncope. Initial encounter.  EXAM: CHEST  2 VIEW  COMPARISON:  Chest radiograph performed 05/01/2008  FINDINGS: The lungs are well-aerated and clear. There is no evidence of focal opacification, pleural effusion or pneumothorax.  The heart is normal in size; the mediastinal contour is within normal limits. No acute osseous abnormalities are seen.  IMPRESSION: No acute cardiopulmonary process seen.   Electronically Signed   By: Garald Balding M.D.   On: 09/22/2014 19:56   Ct Head Wo Contrast  09/22/2014   CLINICAL DATA:  Syncopal episodes. Recent stent placement. Initial encounter.  EXAM: CT HEAD WITHOUT CONTRAST  TECHNIQUE: Contiguous axial images were obtained from the base of the skull through the vertex without intravenous contrast.  COMPARISON:  None.  FINDINGS: There is no evidence of acute intracranial hemorrhage, parenchymal mass lesion, brain edema or hydrocephalus. Prominent CSF space anterolateral to the right temporal lobe is associated with adjacent calvarial scalloping and most likely represents an arachnoid cyst measuring up to 3.0 x 2.6 cm on image 13. No acute extra-axial fluid collection or midline shift demonstrated.  Intracranial vascular calcifications noted.  The visualized right maxillary sinus is completely opacified. The visualized paranasal sinuses, mastoid air cells and middle ears are otherwise clear. The calvarium is intact.  IMPRESSION: 1. No acute intracranial findings. 2. Arachnoid cyst within the right middle cranial fossa.   Electronically Signed   By: Richardean Sale M.D.   On: 09/22/2014 22:01   US Renal  09/23/2014   CLINICAL DATA:  Abdominal pain, history of polycystic kidney disease  EXAM: RENAL / URINARY TRACT ULTRASOUND COMPLETE  COMPARISON:  CT from earlier in the same day  FINDINGS: Right Kidney:  Length: 19.8 cm. Multiple cysts are noted consistent with the given clinical history and findings on recent CT examination. The largest of these measures 4.8 cm in greatest dimension.  Left Kidney:  Length: 19.8 cm. Multiple cysts are noted throughout the left kidney. The largest of these measures 6.1 cm in greatest dimension.  Bladder:  Appears normal for degree of bladder distention.  IMPRESSION: Changes consistent with polycystic kidney disease.   Electronically Signed   By: Inez Catalina M.D.   On: 09/23/2014 08:10         Subjective:  patient continues to complain of some low back pain benign and was usual. He complains of some dyspnea on exertion without any chest pain. Denies any nausea, vomiting, diarrhea. He states that his abdominal pain is better than it was but is not gone. Denies any hematochezia, melena, hematemesis, dizziness, headache, fevers, chills.   Objective: Filed Vitals:   09/23/14 0130 09/23/14 0336 09/23/14 0348 09/23/14 0538  BP: 170/94 171/94  138/77  Pulse: 67 69  61  Temp:  99.3  F (37.4 C)  98.9 F (37.2 C)  TempSrc:  Oral  Oral  Resp: 16 20  18   Height:  6\' 4"  (1.93 m)    Weight:  92.4 kg (203 lb 11.3 oz)    SpO2: 95% 95% 95% 92%    Intake/Output Summary (Last 24 hours) at 09/23/14 1311 Last data filed at 09/23/14 1016  Gross per 24 hour  Intake  1326.67 ml  Output   1600 ml  Net -273.33 ml   Weight change:  Exam:   General:  Pt is alert, follows commands appropriately, not in acute distress  HEENT: No icterus, No thrush, No neck mass, [place]/AT  Cardiovascular: RRR, S1/S2, no rubs, no gallops  Respiratory: diminished breath sounds at the bases, no wheezing bibasilar rales   Abdomen: Soft/+BS, mild right lower quadrant and left lower quadrant tenderness without any rebound, non distended, no guarding; no hepatosplenomegaly  Extremities: No edema, No lymphangitis, No petechiae, No rashes, no synovitis; ecchymosis noted in the right inguinal area without any active bleeding  Data Reviewed: Basic Metabolic Panel:  Recent Labs Lab 09/20/14 0853 09/22/14 1920 09/23/14 0500  NA 137 138 139  K 4.0 5.0 3.9  CL 104 103 100*  CO2  --  24 28  GLUCOSE 109* 116* 92  BUN 21* 15 14  CREATININE 1.80* 1.89* 1.69*  CALCIUM  --  8.9 8.8*   Liver Function Tests:  Recent Labs Lab 09/22/14 2039  AST 23  ALT 14*  ALKPHOS 108  BILITOT 0.7  PROT 6.7  ALBUMIN 3.0*   No results for input(s): LIPASE, AMYLASE in the last 168 hours. No results for input(s): AMMONIA in the last 168 hours. CBC:  Recent Labs Lab 09/20/14 0853 09/22/14 1920  WBC  --  11.1*  NEUTROABS  --  10.0*  HGB 14.6 13.6  HCT 43.0 40.1  MCV  --  94.8  PLT  --  234   Cardiac Enzymes:  Recent Labs Lab 09/22/14 1920  TROPONINI <0.03   BNP: Invalid input(s): POCBNP CBG:  Recent Labs Lab 09/23/14 0953  GLUCAP 98    No results found for this or any previous visit (from the past 240 hour(s)).   Scheduled Meds: . amitriptyline  10 mg Oral QHS  . amLODipine  10 mg Oral Daily  . aspirin EC  81 mg Oral Daily  . baclofen  10 mg Oral TID  . budesonide-formoterol  2 puff Inhalation BID  . buPROPion  150 mg Oral BID WC  . clopidogrel  75 mg Oral Daily  . dextromethorphan-guaiFENesin  1 tablet Oral BID  . heparin  5,000 Units Subcutaneous 3 times  per day  . ipratropium-albuterol  3 mL Nebulization Q4H  . pantoprazole  40 mg Oral Daily  . sodium chloride  3 mL Intravenous Q12H   Continuous Infusions: . sodium chloride 100 mL/hr at 09/23/14 0415     Gulianna Hornsby, DO  [facility] Pager [phone]  If 7PM-7AM, please contact night-coverage [email or web address] Password [email or web address] 09/23/2014, 1:11 PM   LOS: 0 days

## 2014-09-23 NOTE — Consult Note (Signed)
Primary cardiologist: Dr. Minus Breeding (2012) Consulting cardiologist: Dr. Satira Sark  Reason for consultation: Syncope  Clinical Summary Aaron [name] is a 59 y.o.male with past medical history outlined below, recently status post left superficial femoral artery stenting on June 8 with Dr. Trula Slade, currently admitted to the hospital after episodes of syncope at home. His history includes previous syncope workup several years ago without obvious arrhythmogenic or ischemic culprits. He reports not having any recurring episodes of syncope until just recently. He states that he went home after his procedure, he ate a salad in the evening, and then later on began to experience nausea with emesis. While he was sitting on the side of his bathtub at home, he apparently had an episode of syncope. He felt very dizzy after this, and had a few more episodes after walking and sitting on his couch, and even later the next day. He states that he has not eaten much at all since Thursday evening. He mentions that the more recent syncopal events that occurred the following day were not necessarily preceded by feeling of lightheadedness or nausea. He reports a "jerking" sensation however.  He has been evaluated by Neurology and underwent an EEG which was reported as normal earlier this morning. He has not been hypotensive under observation. Telemetry shows sinus rhythm, a brief burst of PAT, occasional PVCs. Questionable episode of wide-complex tachycardia was actually artifact. Echocardiogram has been ordered and is pending.   Allergies  Allergen Reactions  . Codeine Itching and Nausea Only    Medications Scheduled Medications: . amitriptyline  10 mg Oral QHS  . amLODipine  10 mg Oral Daily  . aspirin EC  81 mg Oral Daily  . baclofen  10 mg Oral TID  . budesonide-formoterol  2 puff Inhalation BID  . buPROPion  150 mg Oral BID WC  . clopidogrel  75 mg Oral Daily  . dextromethorphan-guaiFENesin  1  tablet Oral BID  . heparin  5,000 Units Subcutaneous 3 times per day  . ipratropium-albuterol  3 mL Nebulization Q4H  . pantoprazole  40 mg Oral Daily  . sodium chloride  3 mL Intravenous Q12H    Infusions: . sodium chloride 100 mL/hr at 09/23/14 0415    PRN Medications: acetaminophen **OR** acetaminophen, albuterol, hydrALAZINE, LORazepam, morphine injection, ondansetron **OR** ondansetron (ZOFRAN) IV, oxyCODONE-acetaminophen **AND** [DISCONTINUED] oxyCODONE   Past Medical History  Diagnosis Date  . Hyperlipidemia   . CAD (coronary artery disease)     Mild nonobstructive disease at cardiac catheterization 2002  . Essential hypertension   . Polycystic kidney, unspecified type   . COPD (chronic obstructive pulmonary disease)   . Hepatitis C   . Esophageal reflux   . Nephrolithiasis   . Anxiety   . Migraines   . Diverticulitis   . Chronic back pain   . History of syncope   . Peripheral arterial disease     Occluded left superficial femoral artery status post stent June 2016 - Dr. Trula Slade    Past Surgical History  Procedure Laterality Date  . Back surgery    . Bunionectomy    . Peripheral vascular catheterization N/A 09/20/2014    Procedure: Abdominal Aortogram;  Surgeon: Serafina Mitchell, MD;  Location: [facility];  Service: Cardiovascular;  Laterality: N/A;    Family History  Problem Relation Age of Onset  . Colon cancer Maternal Grandfather 29  . Renal cancer Cousin   . Ovarian cancer Sister     Social  History Mr. Gains reports that he quit smoking about 5 weeks ago. His smoking use included Cigarettes. He has a 47 pack-year smoking history. He has never used smokeless tobacco. [name] reports that he does not drink alcohol.  Review of Systems Complete review of systems negative except as otherwise outlined in the clinical summary and also the following. Mild dyspnea on exertion and fatigue. No exertional chest pain.  Physical Examination Blood pressure  158/92, pulse 77, temperature 98.3 F (36.8 C), temperature source Oral, resp. rate 18, height 6\' 4"  (1.93 m), weight 203 lb 11.3 oz (92.4 kg), SpO2 95 %.  Intake/Output Summary (Last 24 hours) at 09/23/14 1528 Last data filed at 09/23/14 1333  Gross per 24 hour  Intake 1326.67 ml  Output   1900 ml  Net -573.33 ml   Gen.: Patient sitting in bedside chair, appears comfortable at rest. HEENT: Conjunctiva and lids normal, oropharynx clear. Neck: Supple, no elevated JVP or carotid bruits, no thyromegaly. Lungs: Clear to auscultation, nonlabored breathing at rest. Cardiac: Regular rate and rhythm, no S3 or significant systolic murmur, no pericardial rub. Abdomen: Soft, nontender, bowel sounds present, no guarding or rebound. Extremities: No pitting edema, distal pulses 1-2+. Skin: Warm and dry. Scattered tattoos, tanned. Musculoskeletal: No kyphosis. Neuropsychiatric: Alert and oriented x3, affect grossly appropriate.   Lab Results  Basic Metabolic Panel:  Recent Labs Lab 09/20/14 0853 09/22/14 1920 09/23/14 0500  NA 137 138 139  K 4.0 5.0 3.9  CL 104 103 100*  CO2  --  24 28  GLUCOSE 109* 116* 92  BUN 21* 15 14  CREATININE 1.80* 1.89* 1.69*  CALCIUM  --  8.9 8.8*    Liver Function Tests:  Recent Labs Lab 09/22/14 2039  AST 23  ALT 14*  ALKPHOS 108  BILITOT 0.7  PROT 6.7  ALBUMIN 3.0*    CBC:  Recent Labs Lab 09/20/14 0853 09/22/14 1920  WBC  --  11.1*  NEUTROABS  --  10.0*  HGB 14.6 13.6  HCT 43.0 40.1  MCV  --  94.8  PLT  --  234    Cardiac Enzymes:  Recent Labs Lab 09/22/14 1920  TROPONINI <0.03    ECG Sinus rhythm, normal PR interval with some slurring of the anterior QRS segment but not definitive delta waves. Poor anterior R-wave progression, cannot exclude old infarct pattern.  Imaging Chest x-ray 09/22/2014: FINDINGS: The lungs are well-aerated and clear. There is no evidence of focal opacification, pleural effusion or  pneumothorax.  The heart is normal in size; the mediastinal contour is within normal limits. No acute osseous abnormalities are seen.  IMPRESSION: No acute cardiopulmonary process seen.   Impression  1. Recurrent syncope, etiology not definitive at this point. 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 He had a previous workup that did not demonstrate any obvious arrhythmogenic or ischemic etiology. EEG was normal as reported today. Cardiac monitoring does not show any sustained arrhythmias, he did have a brief episode of PAT, the questionable episode of wide-complex tachycardia was artifact.  2. History of mild, nonobstructive CAD by cardiac catheterization in 2002.  3. Peripheral arterial disease with recent stent placement to an occluded left superficial femoral artery by Dr. Trula Slade this past week.  4. Essential hypertension.  Recommendations   Follow-up echocardiogram that has already been ordered. Continue to follow telemetry. Check a formal set of orthostatic vital signs. Further recommendations to follow as it relates to potential for repeat ischemic workup, and longer-term cardiac monitoring.  Satira Sark, M.D., F.A.C.C.

## 2014-09-24 ENCOUNTER — Inpatient Hospital Stay (HOSPITAL_COMMUNITY): Payer: Medicare Other

## 2014-09-24 DIAGNOSIS — R55 Syncope and collapse: Secondary | ICD-10-CM

## 2014-09-24 LAB — BASIC METABOLIC PANEL
ANION GAP: 11 (ref 5–15)
BUN: 15 mg/dL (ref 6–20)
CALCIUM: 8.4 mg/dL — AB (ref 8.9–10.3)
CHLORIDE: 101 mmol/L (ref 101–111)
CO2: 23 mmol/L (ref 22–32)
Creatinine, Ser: 1.62 mg/dL — ABNORMAL HIGH (ref 0.61–1.24)
GFR, EST AFRICAN AMERICAN: 52 mL/min — AB (ref >60)
GFR, EST NON AFRICAN AMERICAN: 45 mL/min — AB (ref >60)
GLUCOSE: 95 mg/dL (ref 65–99)
POTASSIUM: 3.8 mmol/L (ref 3.5–5.1)
Sodium: 135 mmol/L (ref 135–145)

## 2014-09-24 LAB — CBC
HEMATOCRIT: 39 % (ref 39.0–52.0)
HEMOGLOBIN: 13.1 g/dL (ref 13.0–17.0)
MCH: 32.2 pg (ref 26.0–34.0)
MCHC: 33.6 g/dL (ref 30.0–36.0)
MCV: 95.8 fL (ref 78.0–100.0)
Platelets: 226 10*3/uL (ref 150–400)
RBC: 4.07 MIL/uL — AB (ref 4.22–5.81)
RDW: 14.1 % (ref 11.5–15.5)
WBC: 7.1 10*3/uL (ref 4.0–10.5)

## 2014-09-24 LAB — GLUCOSE, CAPILLARY: Glucose-Capillary: 122 mg/dL — ABNORMAL HIGH (ref 65–99)

## 2014-09-24 MED ORDER — METOPROLOL SUCCINATE ER 25 MG PO TB24
12.5000 mg | ORAL_TABLET | Freq: Every day | ORAL | Status: DC
Start: 2014-09-24 — End: 2016-12-14

## 2014-09-24 MED ORDER — WHITE PETROLATUM GEL
Status: AC
  Filled 2014-09-24: qty 1

## 2014-09-24 MED ORDER — METOPROLOL SUCCINATE 12.5 MG HALF TABLET
12.5000 mg | ORAL_TABLET | Freq: Every day | ORAL | Status: DC
  Administered 2014-09-24: 12.5 mg via ORAL
  Filled 2014-09-24: qty 1

## 2014-09-24 NOTE — Progress Notes (Signed)
Echocardiogram 2D Echocardiogram has been performed.  Jennette Dubin 09/24/2014, 10:08 AM

## 2014-09-24 NOTE — Progress Notes (Signed)
Primary cardiologist: Dr. Minus Breeding  Seen for followup: Syncope  Subjective:    No recurrent syncope. No chest pain.  Objective:   Temp:  [98.3 F (36.8 C)-98.4 F (36.9 C)] 98.4 F (36.9 C) (06/12 0511) Pulse Rate:  [66-82] 76 (06/12 0511) Resp:  [18] 18 (06/12 0511) BP: (141-174)/(84-110) 152/84 mmHg (06/12 0511) SpO2:  [95 %-98 %] 95 % (06/12 0511) Last BM Date: 09/21/14  Filed Weights   09/23/14 0336  Weight: 203 lb 11.3 oz (92.4 kg)    Intake/Output Summary (Last 24 hours) at 09/24/14 1027 Last data filed at 09/24/14 0916  Gross per 24 hour  Intake   1040 ml  Output    850 ml  Net    190 ml    Telemetry: Sinus rhythm with brief 4 beat NSVT - asymptomatic.  Exam:  General: Appears comfortable.  Lungs: Clear, nonlabored.  Cardiac: RRR without gallop.  Extremities: No edema.  Lab Results:  Basic Metabolic Panel:  Recent Labs Lab 09/22/14 1920 09/23/14 0500 09/24/14 0544  NA 138 139 135  K 5.0 3.9 3.8  CL 103 100* 101  CO2 24 28 23   GLUCOSE 116* 92 95  BUN 15 14 15   CREATININE 1.89* 1.69* 1.62*  CALCIUM 8.9 8.8* 8.4*    CBC:  Recent Labs Lab 09/20/14 0853 09/22/14 1920 09/24/14 0544  WBC  --  11.1* 7.1  HGB 14.6 13.6 13.1  HCT 43.0 40.1 39.0  MCV  --  94.8 95.8  PLT  --  234 226    Cardiac Enzymes:  Recent Labs Lab 09/22/14 1920  TROPONINI <0.03    Echocardiogram 09/24/2014: Study Conclusions  - Left ventricle: Abnormal septal motion The cavity size was mildly dilated. Wall thickness was normal. Systolic function was normal. The estimated ejection fraction was in the range of 50% to 55%. - Left atrium: The atrium was mildly dilated. - Atrial septum: There was increased thickness of the septum, consistent with lipomatous hypertrophy. A patent foramen ovale cannot be excluded.   Medications:   Scheduled Medications: . amitriptyline  10 mg Oral QHS  . amLODipine  10 mg Oral Daily  . aspirin EC  81 mg  Oral Daily  . baclofen  10 mg Oral TID  . budesonide-formoterol  2 puff Inhalation BID  . buPROPion  150 mg Oral BID WC  . clopidogrel  75 mg Oral Daily  . dextromethorphan-guaiFENesin  1 tablet Oral BID  . heparin  5,000 Units Subcutaneous 3 times per day  . pantoprazole  40 mg Oral Daily  . sodium chloride  3 mL Intravenous Q12H  . white petrolatum        Infusions: . sodium chloride 100 mL/hr at 09/24/14 0556    PRN Medications: acetaminophen **OR** acetaminophen, albuterol, hydrALAZINE, ipratropium-albuterol, LORazepam, morphine injection, ondansetron **OR** ondansetron (ZOFRAN) IV, oxyCODONE-acetaminophen **AND** [DISCONTINUED] oxyCODONE, zolpidem   Assessment:   1. Recurrent syncope, etiology not definitive at this point. 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 Telemetry shows sinus rhythm with brief 4 beat asymptomatic NSVT. LVEF 50-55% by echocardiogram.  2. History of mild, nonobstructive CAD by cardiac catheterization in 2002. No angina.  3. Peripheral arterial disease with recent stent placement to an occluded left superficial femoral artery by Dr. Trula Slade this past week.  4. Essential hypertension.   Plan/Discussion:    Would add Toprol XL 12.5 mg daily for now. Can have outpatient followup with Dr. Percival Spanish to consider repeat course of cardiac monitoring. An implantable loop might  be a consideration unless episodes continue more frequently.   Satira Sark, M.D., F.A.C.C.

## 2014-09-24 NOTE — Plan of Care (Signed)
Problem: Phase I Progression Outcomes Goal: Pain controlled with appropriate interventions Outcome: Adequate for Discharge Pt has chronic pain.

## 2014-09-24 NOTE — Progress Notes (Signed)
[name] to be D/C'd Home per MD order.  Discussed with the patient and all questions fully answered.  VSS, Skin clean, dry and intact without evidence of skin break down, no evidence of skin tears noted. IV catheter discontinued intact. Site without signs and symptoms of complications. Dressing and pressure applied.  An After Visit Summary was printed and given to the patient. Patient received prescription.  D/c education completed with patient/family including follow up instructions, medication list, d/c activities limitations if indicated, with other d/c instructions as indicated by MD - patient able to verbalize understanding, all questions fully answered.   Patient instructed to return to ED, call 911, or call MD for any changes in condition.   Patient escorted via [place], and D/C home via private auto.  Audria Nine F 09/24/2014 2:40 PM

## 2014-09-24 NOTE — Progress Notes (Signed)
Subjective: Patient has had no further events since hospitalization.  No new complaints.    Objective: Current vital signs: BP 152/84 mmHg  Pulse 76  Temp(Src) 98.4 F (36.9 C) (Oral)  Resp 18  Ht 6\' 4"  (1.93 m)  Wt 92.4 kg (203 lb 11.3 oz)  BMI 24.81 kg/m2  SpO2 95% Vital signs in last 24 hours: Temp:  [98.3 F (36.8 C)-98.4 F (36.9 C)] 98.4 F (36.9 C) (06/12 0511) Pulse Rate:  [66-82] 76 (06/12 0511) Resp:  [18] 18 (06/12 0511) BP: (141-174)/(84-110) 152/84 mmHg (06/12 0511) SpO2:  [95 %-98 %] 95 % (06/12 0511)  Intake/Output from previous day: 06/11 0701 - 06/12 0700 In: 866.7 [P.O.:540; I.V.:326.7] Out: 850 [Urine:850] Intake/Output this shift:   Nutritional status: Diet full liquid Room service appropriate?: Yes; Fluid consistency:: Thin  Neurologic Exam: Mental Status: Alert, oriented, thought content appropriate. Speech fluent without evidence of aphasia. Able to follow 3 step commands without difficulty. Cranial Nerves: II: Discs flat bilaterally; Visual fields grossly normal, pupils equal, round, reactive to light and accommodation III,IV, VI: ptosis not present, extra-ocular motions intact bilaterally V,VII: smile symmetric, facial light touch sensation normal bilaterally VIII: hearing normal bilaterally IX,X: uvula rises symmetrically XI: bilateral shoulder shrug XII: midline tongue extension without atrophy or fasciculations Motor: 5/5throughout Sensory: Pinprick and light touch intact throughout, bilaterally Deep Tendon Reflexes:  1+ throughout  Lab Results: Basic Metabolic Panel:  Recent Labs Lab 09/20/14 0853 09/22/14 1920 09/23/14 0500 09/24/14 0544  NA 137 138 139 135  K 4.0 5.0 3.9 3.8  CL 104 103 100* 101  CO2  --  24 28 23   GLUCOSE 109* 116* 92 95  BUN 21* 15 14 15   CREATININE 1.80* 1.89* 1.69* 1.62*  CALCIUM  --  8.9 8.8* 8.4*    Liver Function Tests:  Recent Labs Lab 09/22/14 2039  AST 23  ALT 14*  ALKPHOS 108   BILITOT 0.7  PROT 6.7  ALBUMIN 3.0*   No results for input(s): LIPASE, AMYLASE in the last 168 hours. No results for input(s): AMMONIA in the last 168 hours.  CBC:  Recent Labs Lab 09/20/14 0853 09/22/14 1920 09/24/14 0544  WBC  --  11.1* 7.1  NEUTROABS  --  10.0*  --   HGB 14.6 13.6 13.1  HCT 43.0 40.1 39.0  MCV  --  94.8 95.8  PLT  --  234 226    Cardiac Enzymes:  Recent Labs Lab 09/22/14 1920  TROPONINI <0.03    Lipid Panel:  Recent Labs Lab 09/23/14 0500  CHOL 186  TRIG 74  HDL 63  CHOLHDL 3.0  VLDL 15  LDLCALC 108*    CBG:  Recent Labs Lab 09/23/14 0953 09/24/14 0810  GLUCAP 98 122*    Microbiology: Results for orders placed or performed during the hospital encounter of 09/22/14  Culture, blood (routine x 2)     Status: None (Preliminary result)   Collection Time: 09/23/14  5:00 AM  Result Value Ref Range Status   Specimen Description BLOOD RIGHT ANTECUBITAL  Final   Special Requests BOTTLES DRAWN AEROBIC ONLY 3CC  Final   Culture   Final           BLOOD CULTURE RECEIVED NO GROWTH TO DATE CULTURE WILL BE HELD FOR 5 DAYS BEFORE ISSUING A FINAL NEGATIVE REPORT Performed at [facility]    Report Status PENDING  Incomplete  Culture, blood (routine x 2)     Status: None (Preliminary result)  Collection Time: 09/23/14  5:04 AM  Result Value Ref Range Status   Specimen Description BLOOD RIGHT HAND  Final   Special Requests BOTTLES DRAWN AEROBIC ONLY 5CC  Final   Culture   Final           BLOOD CULTURE RECEIVED NO GROWTH TO DATE CULTURE WILL BE HELD FOR 5 DAYS BEFORE ISSUING A FINAL NEGATIVE REPORT Performed at [facility]    Report Status PENDING  Incomplete    Coagulation Studies: No results for input(s): LABPROT, INR in the last 72 hours.  Imaging: Ct Abdomen Pelvis Wo Contrast  09/23/2014   CLINICAL DATA:  Bilateral inguinal pain following left SFA stent placement  EXAM: CT ABDOMEN AND PELVIS WITHOUT CONTRAST   TECHNIQUE: Multidetector CT imaging of the abdomen and pelvis was performed following the standard protocol without IV contrast.  COMPARISON:  03/03/2010  FINDINGS: Lung bases are free of acute infiltrate or sizable effusion.  The liver demonstrates multiple small cystic lesions. The gallbladder, spleen, adrenal glands and pancreas are within normal limits.  The kidneys demonstrate multiple cysts of varying density consistent with polycystic kidney disease. These changes are similar to that seen on the prior examination. No calculi or urinary tract obstructive changes are noted.  The appendix is within normal limits. Mild diverticular change is noted without evidence of diverticulitis. The bladder is well distended. No pelvic mass lesion or retroperitoneal hematoma is noted. Diffuse aortoiliac calcifications are noted.  In the right common femoral artery there is a small perivascular area of increased attenuation seen on image number 90 consistent with a small hematoma. No definitive pseudoaneurysm is seen. Posterior to the right common femoral artery and vein, there is a fluid collection along the course of the right psoas muscle likely representing iliopsoas bursitis. No other focal abnormality is seen. The bony structures are within normal limits  IMPRESSION: Changes consistent with the recent catheterization in the right common femoral artery with a small hematoma identified. No retroperitoneal extension is seen.  Changes consistent with polycystic kidney disease and multiple liver cysts stable from the prior study.  Changes suggestive of right iliopsoas bursitis. This may contribute to the patient's clinical discomfort.   Electronically Signed   By: Inez Catalina M.D.   On: 09/23/2014 07:27   Dg Chest 2 View  09/22/2014   CLINICAL DATA:  Chronic cough and congestion. Nausea and vomiting. Syncope. Initial encounter.  EXAM: CHEST  2 VIEW  COMPARISON:  Chest radiograph performed 05/01/2008  FINDINGS: The lungs  are well-aerated and clear. There is no evidence of focal opacification, pleural effusion or pneumothorax.  The heart is normal in size; the mediastinal contour is within normal limits. No acute osseous abnormalities are seen.  IMPRESSION: No acute cardiopulmonary process seen.   Electronically Signed   By: Garald Balding M.D.   On: 09/22/2014 19:56   Ct Head Wo Contrast  09/22/2014   CLINICAL DATA:  Syncopal episodes. Recent stent placement. Initial encounter.  EXAM: CT HEAD WITHOUT CONTRAST  TECHNIQUE: Contiguous axial images were obtained from the base of the skull through the vertex without intravenous contrast.  COMPARISON:  None.  FINDINGS: There is no evidence of acute intracranial hemorrhage, parenchymal mass lesion, brain edema or hydrocephalus. Prominent CSF space anterolateral to the right temporal lobe is associated with adjacent calvarial scalloping and most likely represents an arachnoid cyst measuring up to 3.0 x 2.6 cm on image 13. No acute extra-axial fluid collection or midline shift demonstrated. Intracranial vascular  calcifications noted.  The visualized right maxillary sinus is completely opacified. The visualized paranasal sinuses, mastoid air cells and middle ears are otherwise clear. The calvarium is intact.  IMPRESSION: 1. No acute intracranial findings. 2. Arachnoid cyst within the right middle cranial fossa.   Electronically Signed   By: Richardean Sale M.D.   On: 09/22/2014 22:01   US Renal  09/23/2014   CLINICAL DATA:  Abdominal pain, history of polycystic kidney disease  EXAM: RENAL / URINARY TRACT ULTRASOUND COMPLETE  COMPARISON:  CT from earlier in the same day  FINDINGS: Right Kidney:  Length: 19.8 cm. Multiple cysts are noted consistent with the given clinical history and findings on recent CT examination. The largest of these measures 4.8 cm in greatest dimension.  Left Kidney:  Length: 19.8 cm. Multiple cysts are noted throughout the left kidney. The largest of these  measures 6.1 cm in greatest dimension.  Bladder:  Appears normal for degree of bladder distention.  IMPRESSION: Changes consistent with polycystic kidney disease.   Electronically Signed   By: Inez Catalina M.D.   On: 09/23/2014 08:10    Medications:  I have reviewed the patient's current medications. Scheduled: . amitriptyline  10 mg Oral QHS  . amLODipine  10 mg Oral Daily  . aspirin EC  81 mg Oral Daily  . baclofen  10 mg Oral TID  . budesonide-formoterol  2 puff Inhalation BID  . buPROPion  150 mg Oral BID WC  . clopidogrel  75 mg Oral Daily  . dextromethorphan-guaiFENesin  1 tablet Oral BID  . heparin  5,000 Units Subcutaneous 3 times per day  . pantoprazole  40 mg Oral Daily  . sodium chloride  3 mL Intravenous Q12H  . white petrolatum        Assessment/Plan: Patient without further events.  Very frequent prior to admission.  No injury or bruising from prior events.  EEG unremarkable.  CT head normal.  Neurological examination non-focal.  Cardiology evaluation in progress.  Patient on Wellbutrin and Elavil.    Recommendations: 1.  If cardiology work up unrevealing would consider discontinuation of Wellbutrin and patient may follow up with neurology as an outpatient.     LOS: 1 day   Alexis Goodell, MD [facility] [phone] 09/24/2014  8:36 AM

## 2014-09-24 NOTE — Discharge Summary (Addendum)
Physician Discharge Summary  Aaron [name] [id number] DOB: 11-Aug-1955 DOA: 09/22/2014  PCP: Celedonio Savage, MD  Admit date: 09/22/2014 Discharge date: 09/24/2014  Recommendations for Outpatient Follow-up:  1. Pt will need to follow up with PCP in 1 week post discharge 2. Please obtain BMP in one week   Discharge Diagnoses:  Syncope -while pt's history suggests a vasovagal mediated response, his telemetry shows short runs of wide complex rhythm with were mostly artifact, but pt did have brief episode of PAT -pt also complains of some worsening exertional dyspnea and stamina/endurance over past 1-2 months -remained on tele--pt did not have any further concerning dysrhythmias  -echo--EF 50-55%, abnormal septal motion -case discussed with cardiology, Dr. Myles Gip, on the day of discharge--Dr. Domenic Polite did not feel pt needed any further inpatient testing from a cardiac standpoint and will set up outpt appointment for the patient for possible event monitor or loop recorder -Dr. 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) -Secondary to volume depletion in the setting of lisinopril use -Continue IV fluids-->overall improving--serum creatinine 1.62 on day of d/c -Baseline creatinine 1.2-1.5 -Renal ultrasound negative for hydronephrosis -Discontinue lisinopril Myoclonic type jerks -doubt seizure -neurology is following -predominantly in sleep waking him up -EEG negative for seizure -neurology recommended stopping Wellbutrin which will be stopped (pt taking it for tobacco cessation) Vomiting and abdominal pain -09/23/2014 CT abdomen and pelvis showed small right-sided femoral artery hematoma without pseudoaneurysm. There was no other acute  intra-abdominal process -abdominal pain and vomiting improved -diet was advanced and he tolerated it Hypertension -Continue amlodipine -d/c lisinopril altogether -start metoprolol succinate 12.5mg  daily COPD -Stable on room air -Continue Symbicort and spiriva -PRN duo nebs Peripheral arterial disease -Appreciate Dr. Donnetta Hutching evaluation -continue ASA and plavix  Chronic back pain with previous discectomy L2-L3 -Continue baclofen, Elavil,  Tobacco abuse -tobacco cessation discussed    Discharge Condition: stable  Disposition: home  Diet:heart healthy Wt Readings from Last 3 Encounters:  09/23/14 92.4 kg (203 lb 11.3 oz)  09/20/14 96.163 kg (212 lb)  09/19/14 91.672 kg (202 lb 1.6 oz)    History of present illness:   59 year old male with a history of hypertension, hyperlipidemia, depression, polycystic kidney disease, CKD stage II presents with 5 episodes of syncope over a period of 48 hours. He states that 4 of the 5 episodes were related to episodes of emesis. However, one of the episodes occur while he was sitting on the couch, and the next thing he recalled he was lying on the floor. He denied any urine or bowel incontinence. There is no family history of sudden cardiac death; however, the patient has noted Increasing dyspnea on exertion and decreasing endurance and exercise tolerance over the last 1-2 months. He denies any diarrhea, hematemesis, hematochezia, melena but does complain of lower abdominal pain which she states is better 24 hours after admission but not completely resolved. the patient denies any alcohol, NSAIDs, a liquid drug use. He smokes 2 packs per day 30 years. The patient had a left superficial femoral artery stent placed by Dr. Trula Slade on 09/20/14. Since then, the patient was placed on aspirin and Plavix. In addition, the patient describes jerking type movements that we came up from sleep for the past 2-3 weeks. After admission, CT of the abdomen and pelvis  was performed. There was no acute intra-abdominal theology. He did have a small right femoral artery hematoma without pseudoaneurysm. The patient's diet was advanced which she tolerated. The patient remained afebrile and hemodynamically stable. Neurology was consulted and his myoclonic jerks. EEG was negative for epileptiform discharges. A recommendation was made to stop the patient's Wellbutrin. He is agreeable to do this. He was only taking it for tobacco cessation. The patient's renal function improved with IV fluids. Cardiology was consulted and felt he did not need any further inpt testing and will set pt up with out pt appt for possible event monitor or loop recorder.  Lisinopril was stopped and pt was started on metoprolol succinate with cardiology's recommendation.   Consultants: Neurology cardiology [facility] Discharge Exam: Filed Vitals:   09/24/14 0511  BP: 152/84  Pulse: 76  Temp: 98.4 F (36.9 C)  Resp: 18   Filed Vitals:   09/23/14 1554 09/23/14 2046 09/23/14 2156 09/24/14 0511  BP: 155/110  141/84 152/84  Pulse: 82 74 68 76  Temp:   98.4 F (36.9 C) 98.4 F (36.9 C)  TempSrc:   Oral Oral  Resp: 18 18 18 18   Height:      Weight:      SpO2: 95% 98% 96% 95%   General: A&O x 3, NAD, pleasant, cooperative Cardiovascular: RRR, no rub, no gallop, no S3 Respiratory: Diminished breath sounds in the bases without wheezing Abdomen:soft, nontender, nondistended, positive bowel sounds Extremities: 1+ LLE edema, No lymphangitis, no petechiae  Discharge Instructions      Discharge Instructions    Diet - low sodium heart healthy    Complete by:  As directed      Discharge instructions    Complete by:  As directed   Stop lisinipril Start metoprolol succinate 12.5mg  daily Stop buproprion     Increase activity slowly    Complete by:  As directed             Medication List    STOP taking these medications        buPROPion 150 MG 12 hr tablet  Commonly known as:   WELLBUTRIN SR     lisinopril 20 MG tablet  Commonly known as:  PRINIVIL,ZESTRIL      TAKE these medications        amitriptyline 10 MG tablet  Commonly known as:  ELAVIL  Take 10 mg by mouth at bedtime.     amLODipine 10 MG tablet  Commonly known as:  NORVASC  Take 10 mg by mouth daily.     aspirin 81 MG tablet  Take 81 mg by mouth daily.     baclofen 10 MG tablet  Commonly known as:  LIORESAL  Take 10 mg by mouth 3 (three) times daily.     budesonide-formoterol 80-4.5 MCG/ACT inhaler  Commonly known as:  SYMBICORT  Inhale 2 puffs into the lungs 2 (two) times daily.     clopidogrel 75 MG tablet  Commonly known as:  PLAVIX  Take 1 tablet (75 mg total) by mouth daily.     Diclofenac Sodium 3 % Gel  Apply 1 application topically 2 (two) times daily as needed (muscle pain).     metoprolol succinate 25 MG 24 hr tablet  Commonly known as:  TOPROL-XL  Take 0.5 tablets (12.5 mg total) by mouth daily.     omeprazole 20 MG capsule  Commonly known as:  PRILOSEC  Take 20 mg by mouth daily.     oxyCODONE-acetaminophen 10-325  MG per tablet  Commonly known as:  PERCOCET  Take 1 tablet by mouth every 8 (eight) hours as needed for pain.     PROVENTIL HFA 108 (90 BASE) MCG/ACT inhaler  Generic drug:  albuterol  Inhale 2 puffs into the lungs every 6 (six) hours as needed for wheezing or shortness of breath.     tiotropium 18 MCG inhalation capsule  Commonly known as:  SPIRIVA  Place 18 mcg into inhaler and inhale daily.         The results of significant diagnostics from this hospitalization (including imaging, microbiology, ancillary and laboratory) are listed below for reference.    Significant Diagnostic Studies: Ct Abdomen Pelvis Wo Contrast  09/23/2014   CLINICAL DATA:  Bilateral inguinal pain following left SFA stent placement  EXAM: CT ABDOMEN AND PELVIS WITHOUT CONTRAST  TECHNIQUE: Multidetector CT imaging of the abdomen and pelvis was performed following the  standard protocol without IV contrast.  COMPARISON:  03/03/2010  FINDINGS: Lung bases are free of acute infiltrate or sizable effusion.  The liver demonstrates multiple small cystic lesions. The gallbladder, spleen, adrenal glands and pancreas are within normal limits.  The kidneys demonstrate multiple cysts of varying density consistent with polycystic kidney disease. These changes are similar to that seen on the prior examination. No calculi or urinary tract obstructive changes are noted.  The appendix is within normal limits. Mild diverticular change is noted without evidence of diverticulitis. The bladder is well distended. No pelvic mass lesion or retroperitoneal hematoma is noted. Diffuse aortoiliac calcifications are noted.  In the right common femoral artery there is a small perivascular area of increased attenuation seen on image number 90 consistent with a small hematoma. No definitive pseudoaneurysm is seen. Posterior to the right common femoral artery and vein, there is a fluid collection along the course of the right psoas muscle likely representing iliopsoas bursitis. No other focal abnormality is seen. The bony structures are within normal limits  IMPRESSION: Changes consistent with the recent catheterization in the right common femoral artery with a small hematoma identified. No retroperitoneal extension is seen.  Changes consistent with polycystic kidney disease and multiple liver cysts stable from the prior study.  Changes suggestive of right iliopsoas bursitis. This may contribute to the patient's clinical discomfort.   Electronically Signed   By: Inez Catalina M.D.   On: 09/23/2014 07:27   Dg Chest 2 View  09/22/2014   CLINICAL DATA:  Chronic cough and congestion. Nausea and vomiting. Syncope. Initial encounter.  EXAM: CHEST  2 VIEW  COMPARISON:  Chest radiograph performed 05/01/2008  FINDINGS: The lungs are well-aerated and clear. There is no evidence of focal opacification, pleural effusion  or pneumothorax.  The heart is normal in size; the mediastinal contour is within normal limits. No acute osseous abnormalities are seen.  IMPRESSION: No acute cardiopulmonary process seen.   Electronically Signed   By: Garald Balding M.D.   On: 09/22/2014 19:56   Ct Head Wo Contrast  09/22/2014   CLINICAL DATA:  Syncopal episodes. Recent stent placement. Initial encounter.  EXAM: CT HEAD WITHOUT CONTRAST  TECHNIQUE: Contiguous axial images were obtained from the base of the skull through the vertex without intravenous contrast.  COMPARISON:  None.  FINDINGS: There is no evidence of acute intracranial hemorrhage, parenchymal mass lesion, brain edema or hydrocephalus. Prominent CSF space anterolateral to the right temporal lobe is associated with adjacent calvarial scalloping and most likely represents an arachnoid cyst measuring up to 3.0 x  2.6 cm on image 13. No acute extra-axial fluid collection or midline shift demonstrated. Intracranial vascular calcifications noted.  The visualized right maxillary sinus is completely opacified. The visualized paranasal sinuses, mastoid air cells and middle ears are otherwise clear. The calvarium is intact.  IMPRESSION: 1. No acute intracranial findings. 2. Arachnoid cyst within the right middle cranial fossa.   Electronically Signed   By: Richardean Sale M.D.   On: 09/22/2014 22:01   US Renal  09/23/2014   CLINICAL DATA:  Abdominal pain, history of polycystic kidney disease  EXAM: RENAL / URINARY TRACT ULTRASOUND COMPLETE  COMPARISON:  CT from earlier in the same day  FINDINGS: Right Kidney:  Length: 19.8 cm. Multiple cysts are noted consistent with the given clinical history and findings on recent CT examination. The largest of these measures 4.8 cm in greatest dimension.  Left Kidney:  Length: 19.8 cm. Multiple cysts are noted throughout the left kidney. The largest of these measures 6.1 cm in greatest dimension.  Bladder:  Appears normal for degree of bladder  distention.  IMPRESSION: Changes consistent with polycystic kidney disease.   Electronically Signed   By: Inez Catalina M.D.   On: 09/23/2014 08:10     Microbiology: Recent Results (from the past 240 hour(s))  Culture, blood (routine x 2)     Status: None (Preliminary result)   Collection Time: 09/23/14  5:00 AM  Result Value Ref Range Status   Specimen Description BLOOD RIGHT ANTECUBITAL  Final   Special Requests BOTTLES DRAWN AEROBIC ONLY 3CC  Final   Culture   Final           BLOOD CULTURE RECEIVED NO GROWTH TO DATE CULTURE WILL BE HELD FOR 5 DAYS BEFORE ISSUING A FINAL NEGATIVE REPORT Performed at [facility]    Report Status PENDING  Incomplete  Culture, blood (routine x 2)     Status: None (Preliminary result)   Collection Time: 09/23/14  5:04 AM  Result Value Ref Range Status   Specimen Description BLOOD RIGHT HAND  Final   Special Requests BOTTLES DRAWN AEROBIC ONLY 5CC  Final   Culture   Final           BLOOD CULTURE RECEIVED NO GROWTH TO DATE CULTURE WILL BE HELD FOR 5 DAYS BEFORE ISSUING A FINAL NEGATIVE REPORT Performed at [facility]    Report Status PENDING  Incomplete     Labs: Basic Metabolic Panel:  Recent Labs Lab 09/20/14 0853 09/22/14 1920 09/23/14 0500 09/24/14 0544  NA 137 138 139 135  K 4.0 5.0 3.9 3.8  CL 104 103 100* 101  CO2  --  24 28 23   GLUCOSE 109* 116* 92 95  BUN 21* 15 14 15   CREATININE 1.80* 1.89* 1.69* 1.62*  CALCIUM  --  8.9 8.8* 8.4*   Liver Function Tests:  Recent Labs Lab 09/22/14 2039  AST 23  ALT 14*  ALKPHOS 108  BILITOT 0.7  PROT 6.7  ALBUMIN 3.0*   No results for input(s): LIPASE, AMYLASE in the last 168 hours. No results for input(s): AMMONIA in the last 168 hours. CBC:  Recent Labs Lab 09/20/14 0853 09/22/14 1920 09/24/14 0544  WBC  --  11.1* 7.1  NEUTROABS  --  10.0*  --   HGB 14.6 13.6 13.1  HCT 43.0 40.1 39.0  MCV  --  94.8 95.8  PLT  --  234 226   Cardiac Enzymes:  Recent  Labs Lab 09/22/14 1920  TROPONINI <  0.03   BNP: Invalid input(s): POCBNP CBG:  Recent Labs Lab 09/23/14 0953 09/24/14 0810  GLUCAP 98 122*    Time coordinating discharge:  Greater than 30 minutes  Signed:  Muneeb Veras, DO [facility] Pager: [phone] 09/24/2014, 11:00 AM

## 2014-09-29 LAB — CULTURE, BLOOD (ROUTINE X 2)
Culture: NO GROWTH
Culture: NO GROWTH

## 2014-10-12 ENCOUNTER — Ambulatory Visit (INDEPENDENT_AMBULATORY_CARE_PROVIDER_SITE_OTHER): Payer: Medicare Other | Admitting: Cardiology

## 2014-10-12 ENCOUNTER — Encounter: Payer: Self-pay | Admitting: Cardiology

## 2014-10-12 VITALS — BP 158/98 | HR 73 | Ht 72.0 in | Wt 201.0 lb

## 2014-10-12 DIAGNOSIS — R55 Syncope and collapse: Secondary | ICD-10-CM

## 2014-10-12 DIAGNOSIS — I10 Essential (primary) hypertension: Secondary | ICD-10-CM | POA: Diagnosis not present

## 2014-10-12 NOTE — Progress Notes (Signed)
Cardiology Office Note   Date:  10/12/2014   ID:  [name], DOB July 03, 1955, MRN [id number]  PCP:  Celedonio Savage, MD  Cardiologist:   Minus Breeding, MD   Chief Complaint  Patient presents with  . POST HOSPTIAL F/U    Patient has had swelling in his ankles.      History of Present Illness: Aaron [name] is a 59 y.o. male who presents for follow up after a hospitalization for syncope.  The etiology of this was not clear.  EEG was negative.  He did have some PAT.  Beta blocker was started.  His lisinopril was stopped secondary to elevated creat.  He denies any cardiovascular symptoms since discharge.  The patient denies any new symptoms such as chest discomfort, neck or arm discomfort. There has been no new shortness of breath, PND or orthopnea. There have been no reported palpitations, presyncope or syncope.  He has had infrequent syncope over the years without clear etiology.    Past Medical History  Diagnosis Date  . Hyperlipidemia   . CAD (coronary artery disease)     Mild nonobstructive disease at cardiac catheterization 2002  . Essential hypertension   . Polycystic kidney, unspecified type   . COPD (chronic obstructive pulmonary disease)   . Hepatitis C   . Esophageal reflux   . Nephrolithiasis   . Anxiety   . Migraines   . Diverticulitis   . Chronic back pain   . History of syncope   . Peripheral arterial disease     Occluded left superficial femoral artery status post stent June 2016 - Dr. Trula Slade    Past Surgical History  Procedure Laterality Date  . Back surgery    . Bunionectomy    . Peripheral vascular catheterization N/A 09/20/2014    Procedure: Abdominal Aortogram;  Surgeon: Serafina Mitchell, MD;  Location: [facility];  Service: Cardiovascular;  Laterality: N/A;     Current Outpatient Prescriptions  Medication Sig Dispense Refill  . albuterol (PROVENTIL HFA) 108 (90 BASE) MCG/ACT inhaler Inhale 2 puffs into the lungs every 6 (six) hours as  needed for wheezing or shortness of breath.     Marland Kitchen amitriptyline (ELAVIL) 10 MG tablet Take 10 mg by mouth at bedtime.    Marland Kitchen amLODipine (NORVASC) 10 MG tablet Take 10 mg by mouth daily.     Marland Kitchen aspirin 81 MG tablet Take 81 mg by mouth daily.      . baclofen (LIORESAL) 10 MG tablet Take 10 mg by mouth 3 (three) times daily.     . budesonide-formoterol (SYMBICORT) 80-4.5 MCG/ACT inhaler Inhale 2 puffs into the lungs 2 (two) times daily.      . clopidogrel (PLAVIX) 75 MG tablet Take 1 tablet (75 mg total) by mouth daily. 30 tablet 11  . Diclofenac Sodium 3 % GEL Apply 1 application topically 2 (two) times daily as needed (muscle pain).     . metoprolol succinate (TOPROL-XL) 25 MG 24 hr tablet Take 0.5 tablets (12.5 mg total) by mouth daily. 30 tablet 1  . omeprazole (PRILOSEC) 20 MG capsule Take 20 mg by mouth daily.      Marland Kitchen oxyCODONE-acetaminophen (PERCOCET) 10-325 MG per tablet Take 1 tablet by mouth every 8 (eight) hours as needed for pain.     Marland Kitchen tiotropium (SPIRIVA) 18 MCG inhalation capsule Place 18 mcg into inhaler and inhale daily.       No current facility-administered medications for this visit.  Allergies:   Codeine    ROS:  Please see the history of present illness.   Otherwise, review of systems are positive for none.   All other systems are reviewed and negative.    PHYSICAL EXAM: VS:  BP 158/98 mmHg  Pulse 73  Ht 6' (1.829 m)  Wt 201 lb (91.173 kg)  BMI 27.25 kg/m2 , BMI Body mass index is 27.25 kg/(m^2). GENERAL:  Well appearing HEENT:  Pupils equal round and reactive, fundi not visualized, oral mucosa unremarkable NECK:  No jugular venous distention, waveform within normal limits, carotid upstroke brisk and symmetric, no bruits, no thyromegaly LYMPHATICS:  No cervical, inguinal adenopathy LUNGS:  Clear to auscultation bilaterally BACK:  No CVA tenderness CHEST:  Unremarkable HEART:  PMI not displaced or sustained,S1 and S2 within normal limits, no S3, no S4, no clicks, no  rubs, no murmurs ABD:  Flat, positive bowel sounds normal in frequency in pitch, no bruits, no rebound, no guarding, no midline pulsatile mass, no hepatomegaly, no splenomegaly EXT:  2 plus pulses throughout, no edema, no cyanosis no clubbing    EKG:  EKG is not ordered today.   Recent Labs: 09/22/2014: ALT 14* 09/23/2014: TSH 0.785 09/24/2014: BUN 15; Creatinine, Ser 1.62*; Hemoglobin 13.1; Platelets 226; Potassium 3.8; Sodium 135    Lipid Panel    Component Value Date/Time   CHOL 186 09/23/2014 0500   TRIG 74 09/23/2014 0500   HDL 63 09/23/2014 0500   CHOLHDL 3.0 09/23/2014 0500   VLDL 15 09/23/2014 0500   LDLCALC 108* 09/23/2014 0500      Wt Readings from Last 3 Encounters:  10/12/14 201 lb (91.173 kg)  09/23/14 203 lb 11.3 oz (92.4 kg)  09/20/14 212 lb (96.163 kg)      Other studies Reviewed: Additional studies/ records that were reviewed today include: Hospital records. Review of the above records demonstrates:  Please see elsewhere in the note.     ASSESSMENT AND PLAN:  Syncope:  The etiology is not clear.  He has vague symptoms.  He passes out while seated on the couch.  If he has recurrence of this he will likely need a loop recorder.  However, for now continue the current meds including the low dose beta blocker.  We will have him follow up in two to three months or sooner if he has any symptoms.     Current medicines are reviewed at length with the patient today.  The patient does not have concerns regarding medicines.  The following changes have been made:  no change  Labs/ tests ordered today include: None  No orders of the defined types were placed in this encounter.     Disposition:   FU with APP in two months.     Signed,  Minus Breeding, MD  10/12/2014 5:12 PM    [facility]

## 2014-10-12 NOTE — Patient Instructions (Signed)
Your physician recommends that you schedule a follow-up appointment in: 2 Months with PA

## 2014-10-26 ENCOUNTER — Encounter: Payer: Self-pay | Admitting: Surgery

## 2014-10-30 ENCOUNTER — Ambulatory Visit (INDEPENDENT_AMBULATORY_CARE_PROVIDER_SITE_OTHER): Payer: Medicare Other | Admitting: Surgery

## 2014-10-30 ENCOUNTER — Ambulatory Visit (INDEPENDENT_AMBULATORY_CARE_PROVIDER_SITE_OTHER)
Admission: RE | Admit: 2014-10-30 | Discharge: 2014-10-30 | Disposition: A | Discharge status: Home / Self Care | Payer: Medicare Other | Source: Ambulatory Visit | Attending: Surgery | Admitting: Surgery

## 2014-10-30 ENCOUNTER — Ambulatory Visit (HOSPITAL_COMMUNITY)
Admission: RE | Admit: 2014-10-30 | Discharge: 2014-10-30 | Disposition: A | Discharge status: Home / Self Care | Payer: Medicare Other | Source: Ambulatory Visit | Attending: Surgery | Admitting: Surgery

## 2014-10-30 DIAGNOSIS — E785 Hyperlipidemia, unspecified: Secondary | ICD-10-CM | POA: Diagnosis not present

## 2014-10-30 DIAGNOSIS — R200 Anesthesia of skin: Secondary | ICD-10-CM | POA: Insufficient documentation

## 2014-10-30 DIAGNOSIS — I739 Peripheral vascular disease, unspecified: Secondary | ICD-10-CM

## 2014-10-30 DIAGNOSIS — Z95828 Presence of other vascular implants and grafts: Secondary | ICD-10-CM | POA: Diagnosis not present

## 2014-10-30 DIAGNOSIS — I10 Essential (primary) hypertension: Secondary | ICD-10-CM | POA: Insufficient documentation

## 2014-10-30 DIAGNOSIS — Z87891 Personal history of nicotine dependence: Secondary | ICD-10-CM | POA: Insufficient documentation

## 2014-10-31 ENCOUNTER — Other Ambulatory Visit: Payer: Self-pay

## 2014-10-31 DIAGNOSIS — I739 Peripheral vascular disease, unspecified: Secondary | ICD-10-CM

## 2014-10-31 NOTE — Patient Instructions (Signed)
Dear [name], Your recent Vascular Lab study on October 30, 2014 indicates: Improvement in Left Leg circulation since Stent placed - now normal in both legs.  Return in 3 months as part of routine surveillance;  Will switch to 6 months, if test is  OK  Next time.               Peripheral Vascular Disease Peripheral Vascular Disease (PVD), also called Peripheral Arterial Disease (PAD), is a circulation problem caused by cholesterol (atherosclerotic plaque) deposits in the arteries. PVD commonly occurs in the lower extremities (legs) but it can occur in other areas of the body, such as your arms. The cholesterol buildup in the arteries reduces blood flow which can cause pain and other serious problems. The presence of PVD can place a person at risk for Coronary Artery Disease (CAD).  CAUSES  Causes of PVD can be many. It is usually associated with more than one risk factor such as:   High Cholesterol.  Smoking.  Diabetes.  Lack of exercise or inactivity.  High blood pressure (hypertension).  Obesity.  Family history. SYMPTOMS   When the lower extremities are affected, patients with PVD may experience:  Leg pain with exertion or physical activity. This is called INTERMITTENT CLAUDICATION. This may present as cramping or numbness with physical activity. The location of the pain is associated with the level of blockage. For example, blockage at the abdominal level (distal abdominal aorta) may result in buttock or hip pain. Lower leg arterial blockage may result in calf pain.  As PVD becomes more severe, pain can develop with less physical activity.  In people with severe PVD, leg pain may occur at rest.  Other PVD signs and symptoms:  Leg numbness or weakness.  Coldness in the affected leg or foot, especially when compared to the other leg.  A change in leg color.  Patients with significant PVD are more prone to ulcers or sores on toes, feet or legs. These may take  longer to heal or may reoccur. The ulcers or sores can become infected.  If signs and symptoms of PVD are ignored, gangrene may occur. This can result in the loss of toes or loss of an entire limb.  Not all leg pain is related to PVD. Other medical conditions can cause leg pain such as:  Blood clots (embolism) or Deep Vein Thrombosis.  Inflammation of the blood vessels (vasculitis).  Spinal stenosis. DIAGNOSIS  Diagnosis of PVD can involve several different types of tests. These can include:  Pulse Volume Recording Method (PVR). This test is simple, painless and does not involve the use of X-rays. PVR involves measuring and comparing the blood pressure in the arms and legs. An ABI (Ankle-Brachial Index) is calculated. The normal ratio of blood pressures is 1. As this number becomes smaller, it indicates more severe disease.  < 0.95 - indicates significant narrowing in one or more leg vessels.  <0.8 - there will usually be pain in the foot, leg or buttock with exercise.  <0.4 - will usually have pain in the legs at rest.  <0.25 - usually indicates limb threatening PVD.  Doppler detection of pulses in the legs. This test is painless and checks to see if you have a pulses in your legs/feet.  A dye or contrast material (a substance that highlights the blood vessels so they show up on x-ray) may be given to help your caregiver better see the arteries for the following tests. The dye  is eliminated from your body by the kidney's. Your caregiver may order blood work to check your kidney function and other laboratory values before the following tests are performed:  Magnetic Resonance Angiography (MRA). An MRA is a picture study of the blood vessels and arteries. The MRA machine uses a large magnet to produce images of the blood vessels.  Computed Tomography Angiography (CTA). A CTA is a specialized x-ray that looks at how the blood flows in your blood vessels. An IV may be inserted into your  arm so contrast dye can be injected.  Angiogram. Is a procedure that uses x-rays to look at your blood vessels. This procedure is minimally invasive, meaning a small incision (cut) is made in your groin. A small tube (catheter) is then inserted into the artery of your groin. The catheter is guided to the blood vessel or artery your caregiver wants to examine. Contrast dye is injected into the catheter. X-rays are then taken of the blood vessel or artery. After the images are obtained, the catheter is taken out. TREATMENT  Treatment of PVD involves many interventions which may include:  Lifestyle changes:  Quitting smoking.  Exercise.  Following a low fat, low cholesterol diet.  Control of diabetes.  Foot care is very important to the PVD patient. Good foot care can help prevent infection.  Medication:  Cholesterol-lowering medicine.  Blood pressure medicine.  Anti-platelet drugs.  Certain medicines may reduce symptoms of Intermittent Claudication.  Interventional/Surgical options:  Angioplasty. An Angioplasty is a procedure that inflates a balloon in the blocked artery. This opens the blocked artery to improve blood flow.  Stent Implant. A wire mesh tube (stent) is placed in the artery. The stent expands and stays in place, allowing the artery to remain open.  Peripheral Bypass Surgery. This is a surgical procedure that reroutes the blood around a blocked artery to help improve blood flow. This type of procedure may be performed if Angioplasty or stent implants are not an option. SEEK IMMEDIATE MEDICAL CARE IF:   You develop pain or numbness in your arms or legs.  Your arm or leg turns cold, becomes blue in color.  You develop redness, warmth, swelling and pain in your arms or legs. MAKE SURE YOU:   Understand these instructions.  Will watch your condition.  Will get help right away if you are not doing well or get worse. Document Released: 05/08/2004 Document  Revised: 06/23/2011 Document Reviewed: 04/04/2008 [facility] Patient Information 2015 [place], [place]. This information is not intended to replace advice given to you by your health care provider. Make sure you discuss any questions you have with your health care provider.

## 2014-12-22 ENCOUNTER — Encounter (HOSPITAL_COMMUNITY): Payer: Medicare Other

## 2014-12-22 ENCOUNTER — Ambulatory Visit: Payer: Medicare Other | Admitting: Family

## 2015-02-07 ENCOUNTER — Encounter: Payer: Self-pay | Admitting: Family

## 2015-02-12 ENCOUNTER — Ambulatory Visit: Payer: Medicare Other | Admitting: Family

## 2015-02-12 ENCOUNTER — Other Ambulatory Visit: Payer: Self-pay | Admitting: Surgery

## 2015-02-12 ENCOUNTER — Encounter (HOSPITAL_COMMUNITY): Payer: Medicare Other

## 2015-02-12 DIAGNOSIS — I739 Peripheral vascular disease, unspecified: Secondary | ICD-10-CM

## 2015-02-12 DIAGNOSIS — Z48812 Encounter for surgical aftercare following surgery on the circulatory system: Secondary | ICD-10-CM

## 2015-04-03 ENCOUNTER — Encounter (INDEPENDENT_AMBULATORY_CARE_PROVIDER_SITE_OTHER): Payer: Medicare Other | Admitting: Physician Assistant

## 2015-04-03 ENCOUNTER — Encounter: Payer: Self-pay | Admitting: Physician Assistant

## 2015-04-03 DIAGNOSIS — Z9189 Other specified personal risk factors, not elsewhere classified: Secondary | ICD-10-CM

## 2015-04-03 NOTE — Progress Notes (Deleted)
Patient did not show.

## 2015-04-03 NOTE — Progress Notes (Signed)
This encounter was created in error - please disregard.

## 2015-04-10 NOTE — Progress Notes (Signed)
Cancelled.  

## 2016-01-01 ENCOUNTER — Emergency Department (HOSPITAL_COMMUNITY): Payer: No Typology Code available for payment source

## 2016-01-01 ENCOUNTER — Encounter (HOSPITAL_COMMUNITY): Payer: Self-pay | Admitting: Emergency Medicine

## 2016-01-01 ENCOUNTER — Emergency Department (HOSPITAL_COMMUNITY)
Admission: EM | Admit: 2016-01-01 | Discharge: 2016-01-01 | Disposition: A | Discharge status: Home / Self Care | Payer: No Typology Code available for payment source | Attending: Emergency Medicine | Admitting: Emergency Medicine

## 2016-01-01 DIAGNOSIS — Y999 Unspecified external cause status: Secondary | ICD-10-CM | POA: Insufficient documentation

## 2016-01-01 DIAGNOSIS — S39012A Strain of muscle, fascia and tendon of lower back, initial encounter: Secondary | ICD-10-CM | POA: Diagnosis not present

## 2016-01-01 DIAGNOSIS — N182 Chronic kidney disease, stage 2 (mild): Secondary | ICD-10-CM | POA: Diagnosis not present

## 2016-01-01 DIAGNOSIS — M542 Cervicalgia: Secondary | ICD-10-CM | POA: Insufficient documentation

## 2016-01-01 DIAGNOSIS — Y939 Activity, unspecified: Secondary | ICD-10-CM | POA: Diagnosis not present

## 2016-01-01 DIAGNOSIS — S0231XA Fracture of orbital floor, right side, initial encounter for closed fracture: Secondary | ICD-10-CM | POA: Diagnosis not present

## 2016-01-01 DIAGNOSIS — J449 Chronic obstructive pulmonary disease, unspecified: Secondary | ICD-10-CM | POA: Diagnosis not present

## 2016-01-01 DIAGNOSIS — Z7982 Long term (current) use of aspirin: Secondary | ICD-10-CM | POA: Insufficient documentation

## 2016-01-01 DIAGNOSIS — I10 Essential (primary) hypertension: Secondary | ICD-10-CM

## 2016-01-01 DIAGNOSIS — I2510 Atherosclerotic heart disease of native coronary artery without angina pectoris: Secondary | ICD-10-CM | POA: Diagnosis not present

## 2016-01-01 DIAGNOSIS — Z87891 Personal history of nicotine dependence: Secondary | ICD-10-CM | POA: Insufficient documentation

## 2016-01-01 DIAGNOSIS — I129 Hypertensive chronic kidney disease with stage 1 through stage 4 chronic kidney disease, or unspecified chronic kidney disease: Secondary | ICD-10-CM | POA: Diagnosis not present

## 2016-01-01 DIAGNOSIS — S0285XA Fracture of orbit, unspecified, initial encounter for closed fracture: Secondary | ICD-10-CM

## 2016-01-01 DIAGNOSIS — Y92410 Unspecified street and highway as the place of occurrence of the external cause: Secondary | ICD-10-CM | POA: Diagnosis not present

## 2016-01-01 DIAGNOSIS — T148XXA Other injury of unspecified body region, initial encounter: Secondary | ICD-10-CM

## 2016-01-01 DIAGNOSIS — R51 Headache: Secondary | ICD-10-CM | POA: Diagnosis not present

## 2016-01-01 DIAGNOSIS — S0591XA Unspecified injury of right eye and orbit, initial encounter: Secondary | ICD-10-CM | POA: Diagnosis present

## 2016-01-01 MED ORDER — HYDROCODONE-ACETAMINOPHEN 5-325 MG PO TABS: 1.0000 | ORAL_TABLET | ORAL | 0 refills | Status: DC | PRN

## 2016-01-01 MED ORDER — CARISOPRODOL 350 MG PO TABS: 350.0000 mg | ORAL_TABLET | Freq: Three times a day (TID) | ORAL | 0 refills | Status: DC

## 2016-01-01 MED ORDER — TETRACAINE HCL 0.5 % OP SOLN
OPHTHALMIC | Status: AC
  Administered 2016-01-01: 14:00:00
  Filled 2016-01-01: qty 4

## 2016-01-01 NOTE — Discharge Instructions (Signed)
Your blood pressure is elevated, please see Dr. Wenda Overland for follow-up and recheck as soon as possible. Please see Dr.Teoh concerning your orbit fractures. Please use ice pack for about 10-15 minutes at a time to your right face. Use Tylenol or ibuprofen for mild pain. Use Norco for more severe pain. Use Robaxin for muscle pain. Norco and Robaxin may cause drowsiness, please use these medicines with caution.
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
## 2016-01-01 NOTE — ED Notes (Signed)
Pt complaining of neck, back and facial pain. xrays complete, PA to discuss a plan

## 2016-01-01 NOTE — ED Notes (Signed)
Patient given discharge instruction, verbalized understand.  Gave ice pack. Patient ambulatory out of the department.

## 2016-01-07 ENCOUNTER — Ambulatory Visit (INDEPENDENT_AMBULATORY_CARE_PROVIDER_SITE_OTHER): Payer: Medicare Other | Admitting: Otolaryngology

## 2016-01-07 DIAGNOSIS — S0231XA Fracture of orbital floor, right side, initial encounter for closed fracture: Secondary | ICD-10-CM | POA: Diagnosis not present

## 2016-04-14 HISTORY — PX: HERNIA REPAIR: SHX51

## 2016-08-18 ENCOUNTER — Encounter: Payer: Self-pay | Admitting: Gastroenterology

## 2016-09-10 ENCOUNTER — Ambulatory Visit: Payer: Medicare Other | Admitting: Nurse Practitioner

## 2016-09-10 ENCOUNTER — Telehealth: Payer: Self-pay | Admitting: Nurse Practitioner

## 2016-09-10 ENCOUNTER — Encounter: Payer: Self-pay | Admitting: Nurse Practitioner

## 2016-09-10 NOTE — Telephone Encounter (Signed)
Patient was a no show and letter sent  °

## 2016-09-11 NOTE — Telephone Encounter (Signed)
Noted  

## 2016-09-30 ENCOUNTER — Encounter: Payer: Self-pay | Admitting: Nurse Practitioner

## 2016-11-03 ENCOUNTER — Telehealth: Payer: Self-pay | Admitting: Nurse Practitioner

## 2016-11-03 ENCOUNTER — Encounter: Payer: Self-pay | Admitting: Nurse Practitioner

## 2016-11-03 ENCOUNTER — Ambulatory Visit: Payer: 59 | Admitting: Nurse Practitioner

## 2016-11-03 ENCOUNTER — Ambulatory Visit: Payer: Medicare Other | Admitting: Nurse Practitioner

## 2016-11-03 NOTE — Telephone Encounter (Signed)
Noted  

## 2016-11-03 NOTE — Telephone Encounter (Signed)
PATIENT WAS A NO SHOW AND LETTER SENT  °

## 2016-12-14 ENCOUNTER — Encounter (HOSPITAL_COMMUNITY): Payer: Self-pay | Admitting: *Deleted

## 2016-12-14 ENCOUNTER — Inpatient Hospital Stay (HOSPITAL_COMMUNITY)
Admission: EM | Admit: 2016-12-14 | Discharge: 2016-12-19 | DRG: 378 | Disposition: A | Discharge status: Home / Self Care | Payer: Medicare Other | Attending: Internal Medicine | Admitting: Internal Medicine

## 2016-12-14 ENCOUNTER — Emergency Department (HOSPITAL_COMMUNITY): Payer: Medicare Other

## 2016-12-14 DIAGNOSIS — I10 Essential (primary) hypertension: Secondary | ICD-10-CM | POA: Diagnosis present

## 2016-12-14 DIAGNOSIS — N179 Acute kidney failure, unspecified: Secondary | ICD-10-CM | POA: Diagnosis not present

## 2016-12-14 DIAGNOSIS — Z6824 Body mass index (BMI) 24.0-24.9, adult: Secondary | ICD-10-CM

## 2016-12-14 DIAGNOSIS — Z8619 Personal history of other infectious and parasitic diseases: Secondary | ICD-10-CM

## 2016-12-14 DIAGNOSIS — I129 Hypertensive chronic kidney disease with stage 1 through stage 4 chronic kidney disease, or unspecified chronic kidney disease: Secondary | ICD-10-CM | POA: Diagnosis present

## 2016-12-14 DIAGNOSIS — K219 Gastro-esophageal reflux disease without esophagitis: Secondary | ICD-10-CM | POA: Diagnosis present

## 2016-12-14 DIAGNOSIS — N184 Chronic kidney disease, stage 4 (severe): Secondary | ICD-10-CM | POA: Diagnosis not present

## 2016-12-14 DIAGNOSIS — K319 Disease of stomach and duodenum, unspecified: Secondary | ICD-10-CM | POA: Diagnosis present

## 2016-12-14 DIAGNOSIS — R634 Abnormal weight loss: Secondary | ICD-10-CM | POA: Diagnosis present

## 2016-12-14 DIAGNOSIS — Z720 Tobacco use: Secondary | ICD-10-CM | POA: Diagnosis present

## 2016-12-14 DIAGNOSIS — J449 Chronic obstructive pulmonary disease, unspecified: Secondary | ICD-10-CM | POA: Diagnosis present

## 2016-12-14 DIAGNOSIS — B1920 Unspecified viral hepatitis C without hepatic coma: Secondary | ICD-10-CM

## 2016-12-14 DIAGNOSIS — N189 Chronic kidney disease, unspecified: Secondary | ICD-10-CM | POA: Diagnosis present

## 2016-12-14 DIAGNOSIS — R1010 Upper abdominal pain, unspecified: Secondary | ICD-10-CM | POA: Diagnosis not present

## 2016-12-14 DIAGNOSIS — Z95820 Peripheral vascular angioplasty status with implants and grafts: Secondary | ICD-10-CM

## 2016-12-14 DIAGNOSIS — Z7982 Long term (current) use of aspirin: Secondary | ICD-10-CM

## 2016-12-14 DIAGNOSIS — K5731 Diverticulosis of large intestine without perforation or abscess with bleeding: Principal | ICD-10-CM | POA: Diagnosis present

## 2016-12-14 DIAGNOSIS — K222 Esophageal obstruction: Secondary | ICD-10-CM | POA: Diagnosis present

## 2016-12-14 DIAGNOSIS — K633 Ulcer of intestine: Secondary | ICD-10-CM | POA: Diagnosis present

## 2016-12-14 DIAGNOSIS — Z8041 Family history of malignant neoplasm of ovary: Secondary | ICD-10-CM

## 2016-12-14 DIAGNOSIS — K449 Diaphragmatic hernia without obstruction or gangrene: Secondary | ICD-10-CM | POA: Diagnosis present

## 2016-12-14 DIAGNOSIS — I2510 Atherosclerotic heart disease of native coronary artery without angina pectoris: Secondary | ICD-10-CM | POA: Diagnosis present

## 2016-12-14 DIAGNOSIS — N200 Calculus of kidney: Secondary | ICD-10-CM | POA: Diagnosis present

## 2016-12-14 DIAGNOSIS — G8929 Other chronic pain: Secondary | ICD-10-CM | POA: Diagnosis present

## 2016-12-14 DIAGNOSIS — D649 Anemia, unspecified: Secondary | ICD-10-CM | POA: Diagnosis present

## 2016-12-14 DIAGNOSIS — Z8051 Family history of malignant neoplasm of kidney: Secondary | ICD-10-CM

## 2016-12-14 DIAGNOSIS — F17210 Nicotine dependence, cigarettes, uncomplicated: Secondary | ICD-10-CM | POA: Diagnosis present

## 2016-12-14 DIAGNOSIS — K2970 Gastritis, unspecified, without bleeding: Secondary | ICD-10-CM | POA: Diagnosis present

## 2016-12-14 DIAGNOSIS — R319 Hematuria, unspecified: Secondary | ICD-10-CM | POA: Diagnosis present

## 2016-12-14 DIAGNOSIS — K625 Hemorrhage of anus and rectum: Secondary | ICD-10-CM

## 2016-12-14 DIAGNOSIS — Z8249 Family history of ischemic heart disease and other diseases of the circulatory system: Secondary | ICD-10-CM

## 2016-12-14 DIAGNOSIS — N185 Chronic kidney disease, stage 5: Secondary | ICD-10-CM

## 2016-12-14 DIAGNOSIS — I739 Peripheral vascular disease, unspecified: Secondary | ICD-10-CM | POA: Diagnosis present

## 2016-12-14 DIAGNOSIS — E871 Hypo-osmolality and hyponatremia: Secondary | ICD-10-CM | POA: Diagnosis present

## 2016-12-14 DIAGNOSIS — Q613 Polycystic kidney, unspecified: Secondary | ICD-10-CM

## 2016-12-14 DIAGNOSIS — Z800 Family history of malignant neoplasm of digestive organs: Secondary | ICD-10-CM

## 2016-12-14 DIAGNOSIS — R112 Nausea with vomiting, unspecified: Secondary | ICD-10-CM | POA: Diagnosis present

## 2016-12-14 HISTORY — DX: Pneumonia, unspecified organism: J18.9

## 2016-12-14 HISTORY — DX: Unspecified osteoarthritis, unspecified site: M19.90

## 2016-12-14 LAB — LIPASE, BLOOD: LIPASE: 34 U/L (ref 11–51)

## 2016-12-14 LAB — COMPREHENSIVE METABOLIC PANEL
ALBUMIN: 3.5 g/dL (ref 3.5–5.0)
ALT: 7 U/L — ABNORMAL LOW (ref 17–63)
ANION GAP: 11 (ref 5–15)
AST: 12 U/L — ABNORMAL LOW (ref 15–41)
Alkaline Phosphatase: 98 U/L (ref 38–126)
BUN: 33 mg/dL — ABNORMAL HIGH (ref 6–20)
CALCIUM: 8.1 mg/dL — AB (ref 8.9–10.3)
CHLORIDE: 106 mmol/L (ref 101–111)
CO2: 17 mmol/L — ABNORMAL LOW (ref 22–32)
Creatinine, Ser: 5.53 mg/dL — ABNORMAL HIGH (ref 0.61–1.24)
GFR calc non Af Amer: 10 mL/min — ABNORMAL LOW (ref >60)
GFR, EST AFRICAN AMERICAN: 12 mL/min — AB (ref >60)
GLUCOSE: 112 mg/dL — AB (ref 65–99)
POTASSIUM: 5 mmol/L (ref 3.5–5.1)
Sodium: 134 mmol/L — ABNORMAL LOW (ref 135–145)
Total Bilirubin: 0.4 mg/dL (ref 0.3–1.2)
Total Protein: 7.2 g/dL (ref 6.5–8.1)

## 2016-12-14 LAB — URINALYSIS, ROUTINE W REFLEX MICROSCOPIC
Bilirubin Urine: NEGATIVE
GLUCOSE, UA: NEGATIVE mg/dL
Ketones, ur: 5 mg/dL — AB
Leukocytes, UA: NEGATIVE
NITRITE: NEGATIVE
PH: 5 (ref 5.0–8.0)
Protein, ur: 100 mg/dL — AB
Specific Gravity, Urine: 1.012 (ref 1.005–1.030)
Squamous Epithelial / LPF: NONE SEEN

## 2016-12-14 LAB — CBC
HEMATOCRIT: 40.8 % (ref 39.0–52.0)
HEMOGLOBIN: 13.8 g/dL (ref 13.0–17.0)
MCH: 32 pg (ref 26.0–34.0)
MCHC: 33.8 g/dL (ref 30.0–36.0)
MCV: 94.7 fL (ref 78.0–100.0)
Platelets: 231 10*3/uL (ref 150–400)
RBC: 4.31 MIL/uL (ref 4.22–5.81)
RDW: 13.3 % (ref 11.5–15.5)
WBC: 11.2 10*3/uL — ABNORMAL HIGH (ref 4.0–10.5)

## 2016-12-14 MED ORDER — SODIUM CHLORIDE 0.9 % IV SOLN
INTRAVENOUS | Status: DC
  Administered 2016-12-14: 22:00:00 via INTRAVENOUS

## 2016-12-14 MED ORDER — TRAZODONE HCL 50 MG PO TABS
50.0000 mg | ORAL_TABLET | Freq: Once | ORAL | Status: AC
Start: 2016-12-14 — End: 2016-12-14
  Administered 2016-12-14: 50 mg via ORAL
  Filled 2016-12-14: qty 1

## 2016-12-14 MED ORDER — HYDROMORPHONE HCL 1 MG/ML IJ SOLN
1.0000 mg | Freq: Once | INTRAMUSCULAR | Status: AC
  Administered 2016-12-14: 1 mg via INTRAVENOUS
  Filled 2016-12-14: qty 1

## 2016-12-14 MED ORDER — METHYLPREDNISOLONE SODIUM SUCC 40 MG IJ SOLR
40.0000 mg | Freq: Once | INTRAMUSCULAR | Status: AC
  Administered 2016-12-14: 40 mg via INTRAVENOUS
  Filled 2016-12-14: qty 1

## 2016-12-14 MED ORDER — ALBUTEROL SULFATE (2.5 MG/3ML) 0.083% IN NEBU: 2.5000 mg | INHALATION_SOLUTION | RESPIRATORY_TRACT | Status: DC | PRN

## 2016-12-14 MED ORDER — PROCHLORPERAZINE EDISYLATE 5 MG/ML IJ SOLN
5.0000 mg | Freq: Once | INTRAMUSCULAR | Status: AC
  Administered 2016-12-14: 5 mg via INTRAVENOUS
  Filled 2016-12-14: qty 2

## 2016-12-14 MED ORDER — OXYCODONE-ACETAMINOPHEN 5-325 MG PO TABS
1.0000 | ORAL_TABLET | ORAL | Status: DC | PRN
  Administered 2016-12-15 – 2016-12-16 (×6): 1 via ORAL
  Filled 2016-12-14 (×6): qty 1

## 2016-12-14 MED ORDER — ONDANSETRON HCL 4 MG/2ML IJ SOLN
4.0000 mg | Freq: Once | INTRAMUSCULAR | Status: AC
  Administered 2016-12-14: 4 mg via INTRAVENOUS
  Filled 2016-12-14: qty 2

## 2016-12-14 MED ORDER — OXYCODONE HCL 5 MG PO TABS
5.0000 mg | ORAL_TABLET | ORAL | Status: DC | PRN
Start: 2016-12-14 — End: 2016-12-16
  Administered 2016-12-15: 5 mg via ORAL
  Filled 2016-12-14: qty 1

## 2016-12-14 MED ORDER — ONDANSETRON HCL 4 MG PO TABS: 4.0000 mg | ORAL_TABLET | Freq: Four times a day (QID) | ORAL | Status: DC | PRN

## 2016-12-14 MED ORDER — DOXAZOSIN MESYLATE 2 MG PO TABS
8.0000 mg | ORAL_TABLET | Freq: Every day | ORAL | Status: DC
  Administered 2016-12-15 – 2016-12-19 (×5): 8 mg via ORAL
  Filled 2016-12-14 (×5): qty 4

## 2016-12-14 MED ORDER — NICOTINE 21 MG/24HR TD PT24
21.0000 mg | MEDICATED_PATCH | Freq: Every day | TRANSDERMAL | Status: DC
  Administered 2016-12-14 – 2016-12-19 (×6): 21 mg via TRANSDERMAL
  Filled 2016-12-14 (×6): qty 1

## 2016-12-14 MED ORDER — ONDANSETRON HCL 4 MG/2ML IJ SOLN
4.0000 mg | Freq: Four times a day (QID) | INTRAMUSCULAR | Status: DC | PRN
  Administered 2016-12-16 (×2): 4 mg via INTRAVENOUS
  Filled 2016-12-14 (×2): qty 2

## 2016-12-14 MED ORDER — ASPIRIN 81 MG PO CHEW
81.0000 mg | CHEWABLE_TABLET | Freq: Every day | ORAL | Status: DC
  Administered 2016-12-15 – 2016-12-19 (×5): 81 mg via ORAL
  Filled 2016-12-14 (×5): qty 1

## 2016-12-14 MED ORDER — HYDROCODONE-ACETAMINOPHEN 5-325 MG PO TABS
1.0000 | ORAL_TABLET | Freq: Once | ORAL | Status: AC
  Administered 2016-12-14: 1 via ORAL
  Filled 2016-12-14: qty 1

## 2016-12-14 MED ORDER — TRAZODONE HCL 50 MG PO TABS: 50.0000 mg | ORAL_TABLET | Freq: Every evening | ORAL | Status: DC | PRN

## 2016-12-14 MED ORDER — SODIUM CHLORIDE 0.9 % IV BOLUS (SEPSIS)
1000.0000 mL | Freq: Once | INTRAVENOUS | Status: AC
  Administered 2016-12-14: 1000 mL via INTRAVENOUS

## 2016-12-14 MED ORDER — HYDRALAZINE HCL 20 MG/ML IJ SOLN: 10.0000 mg | INTRAMUSCULAR | Status: DC | PRN

## 2016-12-14 MED ORDER — PANTOPRAZOLE SODIUM 40 MG PO TBEC
40.0000 mg | DELAYED_RELEASE_TABLET | Freq: Every day | ORAL | Status: DC
  Administered 2016-12-15 – 2016-12-19 (×5): 40 mg via ORAL
  Filled 2016-12-14 (×5): qty 1

## 2016-12-14 MED ORDER — HEPARIN SODIUM (PORCINE) 5000 UNIT/ML IJ SOLN
5000.0000 [IU] | Freq: Three times a day (TID) | INTRAMUSCULAR | Status: DC
  Administered 2016-12-14 – 2016-12-15 (×4): 5000 [IU] via SUBCUTANEOUS
  Filled 2016-12-14 (×4): qty 1

## 2016-12-14 MED ORDER — OXYCODONE-ACETAMINOPHEN 10-325 MG PO TABS: 1.0000 | ORAL_TABLET | ORAL | Status: DC | PRN

## 2016-12-14 NOTE — ED Notes (Signed)
Patient requesting something for pain. EDP made aware. New verbal orders given.

## 2016-12-14 NOTE — ED Notes (Signed)
ED Provider at bedside. 

## 2016-12-14 NOTE — H&P (Signed)
History and Physical    Aaron [name]:[id number] DOB: Jul 01, 1955 DOA: 12/14/2016  PCP: Deloria Lair., MD   Patient coming from: Home.  I have personally briefly reviewed patient's old medical records in [place]  Chief Complaint: Lateral flank pain, nausea and vomiting.  HPI: Aaron [name] is a 61 y.o. male with medical history significant of anxiety, mild nonobstructive CAD as of 2002, chronic back pain, chronic neck pain, COPD, diverticulosis, GERD, hepatitis C, essential hypertension, hyperlipidemia, migraine headaches, paroxysmal atrial tachycardia, peripheral vascular disease (history of left femoral artery stent placement), syncope, nephrolithiasis, stage IV chronic kidney disease, polycystic kidney disease who is coming to the emergency department with complaints of progressively worse flank pain, nausea, vomiting since Wednesday and decreased urine output since Thursday.  Per patient, he woke up on Wednesday morning, drank some coffee and felt nauseous. He subsequently went to work and did not have any more symptoms during the morning. He mentions that he went out to lunch and then felt sick. His nausea recurred and he vomited multiple times. This was followed by mild epigastric abdominal pain and pain on both flanks. Next day on Thursday, he noticed that his urine volume was decreasing. He had multiple episodes of emesis as well. Symptoms continue through Friday, Saturday and earlier today. He has vomited probably about 15 times since then. He denies fever, but has felt chills and had night sweats. His urine volume has decreased even more over the past few days and he has not felt better, so he decided to come to the emergency department. He denies headaches, sore throat, dyspnea, chest pain, palpitations, lower extremity edema, but has felt mildly lightheaded. He denies diarrhea, constipation, melena or hematochezia. He denies dysuria, but complains of decreased urine volume  and states that his urine looks concentrated. He denies pruritus or skin rashes.  ED Course: Initial vital signs temperature 98.48F, pulse 89, blood pressure 191/103 mmHg, respirations 20 and O2 sat 97% on room air.   Lab work shows an urinalysis with moderate blood, minimal ketonuria 5 mg/dL,  mild Proteinuria 100 mg/dL and bare bacteria. Microscopic Exam of the Urine Showed 0-5 RBCs and 0-5 WBCs. CBC shows a hemoglobin of 13.8 g/dL, WBC of 11.2 and platelets of 231. His lipase level was 34. Sodium 134, potassium 5.0, chloride 106 and bicarbonate 17 mmol/L. BUN was 33, creatinine 5.53 (it was 3.7 in March this year at [facility]) and glucose 112 mg/dL.  Meds in ED: The patient received 1000 mL of normal saline IV bolus, Zofran 4 mg IVP and Norco 08/15/2023 one tablet by mouth in the emergency department. He still complains of pain, but states it is not as intense.  Imaging: CT renal protocol showed bilateral nephromegaly with hyperdense cyst, left-sided nonobstructing urolithiasis, left-sided colon diverticulosis and lumbar spondylosis. Please see images and full radiology report for further detail.  Review of Systems: As per HPI otherwise 10 point review of systems negative.    Past Medical History:  Diagnosis Date  . Anxiety   . CAD (coronary artery disease)    Mild nonobstructive disease at cardiac catheterization 2002  . Chronic back pain   . CKD (chronic kidney disease), stage II   . COPD (chronic obstructive pulmonary disease) ([place])   . Diverticulitis   . Esophageal reflux   . Essential hypertension   . Hepatitis C   . History of syncope   . Hyperlipidemia   . Migraines   . Nephrolithiasis   . PAT (  paroxysmal atrial tachycardia) ([place])   . Peripheral arterial disease (HCC)    Occluded left superficial femoral artery status post stent June 2016 - Dr. Trula Slade  . Polycystic kidney, unspecified type   . Syncope 09/2014    Past Surgical History:  Procedure Laterality Date  . BACK  SURGERY    . BUNIONECTOMY    . CERVICAL SPINE SURGERY    . HERNIA REPAIR    . PERIPHERAL VASCULAR CATHETERIZATION N/A 09/20/2014   Procedure: Abdominal Aortogram;  Surgeon: Serafina Mitchell, MD;  Location: [facility];  Service: Cardiovascular;  Laterality: N/A;     reports that he has been smoking Cigarettes.  He has a 47.00 pack-year smoking history. He has never used smokeless tobacco. He reports that he does not drink alcohol or use drugs.  No Known Allergies  Family History  Problem Relation Age of Onset  . Alcoholism Mother   . Heart disease Father        Massive heart attack  . Heart attack Father   . Atrial fibrillation Father   . Colon cancer Father   . Colon cancer Maternal Grandfather 71  . Alcoholism Maternal Grandfather   . Renal cancer Cousin   . Ovarian cancer Sister     Prior to Admission medications   Medication Sig Start Date End Date Taking? Authorizing Provider  albuterol (PROVENTIL HFA) 108 (90 BASE) MCG/ACT inhaler Inhale 2 puffs into the lungs every 6 (six) hours as needed for wheezing or shortness of breath.    Yes [provider]  aspirin 81 MG tablet Take 81 mg by mouth daily.     Yes [provider]  doxazosin (CARDURA) 8 MG tablet Take 8 mg by mouth daily.   Yes [provider]  HYDROcodone-acetaminophen (NORCO/VICODIN) 5-325 MG tablet Take 1 tablet by mouth every 4 (four) hours as needed. 01/01/16  Yes Lily Kocher, PA-C  omeprazole (PRILOSEC) 20 MG capsule Take 20 mg by mouth daily.     Yes [provider]  oxyCODONE-acetaminophen (PERCOCET) 10-325 MG per tablet Take 1 tablet by mouth every 3 (three) hours.    Yes [provider]  torsemide (DEMADEX) 20 MG tablet Take 20 mg by mouth daily.   Yes [provider]    Physical Exam: Vitals:   12/14/16 1800 12/14/16 1900 12/14/16 1915 12/14/16 2001  BP: (!) 175/95 (!) 172/102  (!) 185/101  Pulse: 82  86 80  Resp:    20  Temp:    97.7 F  (36.5 C)  TempSrc:    Oral  SpO2: 98%  99% 98%  Weight:    87.8 kg (193 lb 9 oz)  Height:    6\' 4"  (1.93 m)    Constitutional: NAD, calm, comfortable Eyes: PERRL, lids and conjunctivae normal ENMT: Mucous membranes are moist. Posterior pharynx clear of any exudate or lesions.Dentures. Neck: normal, supple, no masses, no thyromegaly Respiratory: clear to auscultation bilaterally, no wheezing, no crackles. Normal respiratory effort. No accessory muscle use.  Cardiovascular: Regular rate and rhythm, no murmurs / rubs / gallops. No extremity edema. 2+ pedal pulses. No carotid bruits.  Abdomen: Mildly distended, positive mild engorgement no paraumbilical pains, soft, tympanic. Bilateral flank tenderness on percussion, no masses palpated. No hepatosplenomegaly. Bowel sounds positive.  Musculoskeletal: Mild clubbing, no cyanosis. Good ROM, no contractures. Normal muscle tone.  Skin: no significant rashes, lesions, ulcers on limited skin exam. Neurologic: CN 2-12 grossly intact. Sensation intact, DTR normal. Strength 5/5 in all 4.  Psychiatric: Normal judgment and insight. Alert and oriented x 4. Normal mood.    Labs on Admission: I have personally reviewed following labs and imaging studies  CBC:  Recent Labs Lab 12/14/16 1401  WBC 11.2*  HGB 13.8  HCT 40.8  MCV 94.7  PLT 106   Basic Metabolic Panel:  Recent Labs Lab 12/14/16 1401  NA 134*  K 5.0  CL 106  CO2 17*  GLUCOSE 112*  BUN 33*  CREATININE 5.53*  CALCIUM 8.1*   GFR: Estimated Creatinine Clearance: 17.4 mL/min (A) (by C-G formula based on SCr of 5.53 mg/dL (H)). Liver Function Tests:  Recent Labs Lab 12/14/16 1401  AST 12*  ALT 7*  ALKPHOS 98  BILITOT 0.4  PROT 7.2  ALBUMIN 3.5    Recent Labs Lab 12/14/16 1401  LIPASE 34   No results for input(s): AMMONIA in the last 168 hours. Coagulation Profile: No results for input(s): INR, PROTIME in the last 168 hours. Cardiac Enzymes: No results for  input(s): CKTOTAL, CKMB, CKMBINDEX, TROPONINI in the last 168 hours. BNP (last 3 results) No results for input(s): PROBNP in the last 8760 hours. HbA1C: No results for input(s): HGBA1C in the last 72 hours. CBG: No results for input(s): GLUCAP in the last 168 hours. Lipid Profile: No results for input(s): CHOL, HDL, LDLCALC, TRIG, CHOLHDL, LDLDIRECT in the last 72 hours. Thyroid Function Tests: No results for input(s): TSH, T4TOTAL, FREET4, T3FREE, THYROIDAB in the last 72 hours. Anemia Panel: No results for input(s): VITAMINB12, FOLATE, FERRITIN, TIBC, IRON, RETICCTPCT in the last 72 hours. Urine analysis:    Component Value Date/Time   COLORURINE YELLOW 12/14/2016 1351   APPEARANCEUR CLEAR 12/14/2016 1351   LABSPEC 1.012 12/14/2016 1351   PHURINE 5.0 12/14/2016 1351   GLUCOSEU NEGATIVE 12/14/2016 1351   HGBUR MODERATE (A) 12/14/2016 1351   BILIRUBINUR NEGATIVE 12/14/2016 1351   KETONESUR 5 (A) 12/14/2016 1351   PROTEINUR 100 (A) 12/14/2016 1351   UROBILINOGEN 0.2 03/03/2010 1056   NITRITE NEGATIVE 12/14/2016 1351   LEUKOCYTESUR NEGATIVE 12/14/2016 1351    Radiological Exams on Admission: Ct Renal Stone Study  Result Date: 12/14/2016 CLINICAL DATA:  Bilateral lower abdominal pain, nausea and vomiting with decreased urinary output x3 days. EXAM: CT ABDOMEN AND PELVIS WITHOUT CONTRAST TECHNIQUE: Multidetector CT imaging of the abdomen and pelvis was performed following the standard protocol without IV contrast. COMPARISON:  09/23/2014 CT FINDINGS: Lower chest: Normal size heart. Dependent atelectasis at each lung base right greater than left. No effusion. Hepatobiliary: Multiple small cystic lesions are again noted without dominant mass. No biliary dilatation. The gallbladder is nondistended and free of stones. Pancreas: No ductal dilatation or mass. Spleen: Normal Adrenals/Urinary Tract: Normal bilateral adrenal glands. Bilateral nephromegaly secondary to innumerable cysts of varying  density consistent with polycystic renal disease. No obstructive uropathy. There are nonobstructing left-sided renal calculi. No hydroureteronephrosis. The urinary bladder is unremarkable. Stomach/Bowel: There is descending and sigmoid diverticulosis without acute diverticulitis. No bowel obstruction. Normal small bowel rotation. Nondistended stomach. Normal appendix. Vascular/Lymphatic: Aortoiliac and branch vessel atherosclerosis without aneurysm. No lymphadenopathy. Reproductive: Prostate is unremarkable. Other: Fat containing inguinal hernias bilaterally left lateral fat containing hernia is also seen in the upper abdomen. Musculoskeletal: Interbody blocks noted at the L2-3 and L3-4 interspace with marked degenerative disc disease and vacuum disc phenomenon L5-S1. Retrolisthesis grade 1 is seen of L5 on S1. Some interval disc space narrowing is also noted at L4-5 since prior 2016 exam. IMPRESSION: 1. Bilateral nephromegaly with innumerable  cysts of varying density some which are simple and others hyperdense possibly representing hemorrhagic or proteinaceous cysts are identified in conjunction with cystic stable lesions of the liver and abdominal hernias. 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 2. Left-sided nephrolithiasis.  No findings of obstructive uropathy. 3. Left-sided colonic diverticulosis without acute diverticulitis. 4. Lumbar spondylosis with slight interval disc space narrowing at L4-5. Interbody blocks are again seen at L2-3 and L3-4 with slight grade 1 retrolisthesis of L5 on S1. Electronically Signed   By: Ashley Royalty M.D.   On: 12/14/2016 17:57    EKG: Independently reviewed.  Assessment/Plan Principal Problem:   Acute on chronic renal failure (HCC) Observation/telemetry. Continue time-limited IV hydration overnight. Antiemetics as needed. Follow-up electrolytes and renal function in the morning. Check renal ultrasound in a.m. Consider inpatient  renal consult if no improvement. Otherwise, the patient to follow-up with [facility] as scheduled.  Active Problems:   Polycystic kidney disease As above. Follow-up with nephrology as scheduled.    Nausea and vomiting Continue time-limited IV hydration. Zofran every 6 hours as needed.   Analgesics as needed. No trial of clear liquids.    Essential hypertension Continue Cardura 8 mg by mouth daily. Hydralazine 10 mg every 4 hours as needed. Monitor blood pressure.    Esophageal reflux Pantoprazole 40 mg by mouth daily.    Tobacco abuse Nicotine replacement therapy ordered. Nurse to provide tobacco cessation information to the patient.    PAD (peripheral artery disease) ([place]) Encouraged to cease smoking. Continue aspirin 81 mg by mouth daily.    COPD (chronic obstructive pulmonary disease) (HCC) Supplemental oxygen and bronchodilators as needed.    DVT prophylaxis: Heparin SQ for Code Status: Full code. Family Communication: His daughter and other relatives are present in his room. Disposition Plan: Overnight observation for IV hydration and symptoms treatment. Consults called:  Admission status: Observation/telemetry.   Reubin Milan MD [facility] Pager [phone].  If 7PM-7AM, please contact night-coverage [email or web address] Password [email or web address]  12/14/2016, 9:42 PM

## 2016-12-14 NOTE — ED Provider Notes (Signed)
[facility] DEPT Provider Note   CSN: [id number] Arrival date & time: 12/14/16  1318     History   Chief Complaint Chief Complaint  Patient presents with  . Abdominal Pain    HPI Aaron [name] is a 61 y.o. male.  Patient complains of vomiting for 3 days. He also has had some abdominal pain. Patient has severe renal disease.   The history is provided by the patient.  Abdominal Pain   This is a new problem. The current episode started more than 2 days ago. The problem occurs constantly. The problem has not changed since onset.The pain is associated with an unknown factor. The pain is located in the generalized abdominal region. The quality of the pain is aching. The pain is at a severity of 2/10. Associated symptoms include vomiting. Pertinent negatives include diarrhea, frequency, hematuria and headaches.    Past Medical History:  Diagnosis Date  . Anxiety   . CAD (coronary artery disease)    Mild nonobstructive disease at cardiac catheterization 2002  . Chronic back pain   . CKD (chronic kidney disease), stage II   . COPD (chronic obstructive pulmonary disease) ([place])   . Diverticulitis   . Esophageal reflux   . Essential hypertension   . Hepatitis C   . History of syncope   . Hyperlipidemia   . Migraines   . Nephrolithiasis   . PAT (paroxysmal atrial tachycardia) ([place])   . Peripheral arterial disease (HCC)    Occluded left superficial femoral artery status post stent June 2016 - Dr. Trula Slade  . Polycystic kidney, unspecified type   . Syncope 09/2014    Patient Active Problem List   Diagnosis Date Noted  . Acute on chronic renal failure ([place]) 12/14/2016  . Jerking 09/23/2014  . Syncope 09/23/2014  . COPD (chronic obstructive pulmonary disease) ([place])   . COLD (chronic obstructive lung disease) ([place])   . Depression   . PAD (peripheral artery disease) ([place]) 09/19/2014  . Preop cardiovascular exam 10/28/2010  . Tobacco abuse 10/28/2010  . SYNCOPE 05/07/2010  .  UNSPECIFIED IRON DEFICIENCY ANEMIA 10/04/2009  . 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  . Essential hypertension 10/04/2009  . Esophageal reflux 10/04/2009  . Acute-on-chronic kidney injury ([place]) 10/04/2009  . PRECORDIAL PAIN 10/04/2009    Past Surgical History:  Procedure Laterality Date  . BACK SURGERY    . BUNIONECTOMY    . HERNIA REPAIR    . PERIPHERAL VASCULAR CATHETERIZATION N/A 09/20/2014   Procedure: Abdominal Aortogram;  Surgeon: Serafina Mitchell, MD;  Location: [facility];  Service: Cardiovascular;  Laterality: N/A;       Home Medications    Prior to Admission medications   Medication Sig Start Date End Date Taking? Authorizing Provider  albuterol (PROVENTIL HFA) 108 (90 BASE) MCG/ACT inhaler Inhale 2 puffs into the lungs every 6 (six) hours as needed for wheezing or shortness of breath.    Yes [provider]  aspirin 81 MG tablet Take 81 mg by mouth daily.     Yes [provider]  doxazosin (CARDURA) 8 MG tablet Take 8 mg by mouth daily.   Yes [provider]  HYDROcodone-acetaminophen (NORCO/VICODIN) 5-325 MG tablet Take 1 tablet by mouth every 4 (four) hours as needed. 01/01/16  Yes Lily Kocher, PA-C  omeprazole (PRILOSEC) 20 MG capsule Take 20 mg by mouth daily.     Yes [provider]  oxyCODONE-acetaminophen (PERCOCET) 10-325 MG per tablet Take 1 tablet by mouth every 3 (three)  hours.    Yes [provider]  torsemide (DEMADEX) 20 MG tablet Take 20 mg by mouth daily.   Yes [provider]    Family History Family History  Problem Relation Age of Onset  . Heart disease Father   . Cancer Father   . Heart attack Father   . Atrial fibrillation Father   . Colon cancer Maternal Grandfather 9  . Renal cancer Cousin   . Ovarian cancer Sister     Social History Social History  Substance Use Topics  . Smoking status: Current Every Day Smoker    Packs/day: 1.00    Years: 47.00    Types: Cigarettes    Last  attempt to quit: 08/13/2014  . Smokeless tobacco: Never Used  . Alcohol use No     Allergies   Patient has no active allergies.   Review of Systems Review of Systems  Constitutional: Negative for appetite change and fatigue.  HENT: Negative for congestion, ear discharge and sinus pressure.   Eyes: Negative for discharge.  Respiratory: Negative for cough.   Cardiovascular: Negative for chest pain.  Gastrointestinal: Positive for abdominal pain and vomiting. Negative for diarrhea.  Genitourinary: Negative for frequency and hematuria.  Musculoskeletal: Negative for back pain.  Skin: Negative for rash.  Neurological: Negative for seizures and headaches.  Psychiatric/Behavioral: Negative for hallucinations.     Physical Exam Updated Vital Signs BP (!) 175/95   Pulse 82   Temp 98.1 F (36.7 C) (Oral)   Resp 20   Ht 6\' 4"  (1.93 m)   Wt 81.6 kg (180 lb)   SpO2 98%   BMI 21.91 kg/m   Physical Exam  Constitutional: He is oriented to person, place, and time. He appears well-developed.  HENT:  Head: Normocephalic.  Eyes: Conjunctivae and EOM are normal. No scleral icterus.  Neck: Neck supple. No thyromegaly present.  Cardiovascular: Normal rate and regular rhythm.  Exam reveals no gallop and no friction rub.   No murmur heard. Pulmonary/Chest: No stridor. He has no wheezes. He has no rales. He exhibits no tenderness.  Abdominal: He exhibits no distension. There is tenderness. There is no rebound.  Musculoskeletal: Normal range of motion. He exhibits no edema.  Lymphadenopathy:    He has no cervical adenopathy.  Neurological: He is oriented to person, place, and time. He exhibits normal muscle tone. Coordination normal.  Skin: No rash noted. No erythema.  Psychiatric: He has a normal mood and affect. His behavior is normal.     ED Treatments / Results  Labs (all labs ordered are listed, but only abnormal results are displayed) Labs Reviewed  COMPREHENSIVE METABOLIC  PANEL - Abnormal; Notable for the following:       Result Value   Sodium 134 (*)    CO2 17 (*)    Glucose, Bld 112 (*)    BUN 33 (*)    Creatinine, Ser 5.53 (*)    Calcium 8.1 (*)    AST 12 (*)    ALT 7 (*)    GFR calc non Af Amer 10 (*)    GFR calc Af Amer 12 (*)    All other components within normal limits  CBC - Abnormal; Notable for the following:    WBC 11.2 (*)    All other components within normal limits  URINALYSIS, ROUTINE W REFLEX MICROSCOPIC - Abnormal; Notable for the following:    Hgb urine dipstick MODERATE (*)    Ketones, ur 5 (*)  Protein, ur 100 (*)    Bacteria, UA RARE (*)    All other components within normal limits  LIPASE, BLOOD    EKG  EKG Interpretation None       Radiology Ct Renal Stone Study  Result Date: 12/14/2016 CLINICAL DATA:  Bilateral lower abdominal pain, nausea and vomiting with decreased urinary output x3 days. EXAM: CT ABDOMEN AND PELVIS WITHOUT CONTRAST TECHNIQUE: Multidetector CT imaging of the abdomen and pelvis was performed following the standard protocol without IV contrast. COMPARISON:  09/23/2014 CT FINDINGS: Lower chest: Normal size heart. Dependent atelectasis at each lung base right greater than left. No effusion. Hepatobiliary: Multiple small cystic lesions are again noted without dominant mass. No biliary dilatation. The gallbladder is nondistended and free of stones. Pancreas: No ductal dilatation or mass. Spleen: Normal Adrenals/Urinary Tract: Normal bilateral adrenal glands. Bilateral nephromegaly secondary to innumerable cysts of varying density consistent with polycystic renal disease. No obstructive uropathy. There are nonobstructing left-sided renal calculi. No hydroureteronephrosis. The urinary bladder is unremarkable. Stomach/Bowel: There is descending and sigmoid diverticulosis without acute diverticulitis. No bowel obstruction. Normal small bowel rotation. Nondistended stomach. Normal appendix. Vascular/Lymphatic:  Aortoiliac and branch vessel atherosclerosis without aneurysm. No lymphadenopathy. Reproductive: Prostate is unremarkable. Other: Fat containing inguinal hernias bilaterally left lateral fat containing hernia is also seen in the upper abdomen. Musculoskeletal: Interbody blocks noted at the L2-3 and L3-4 interspace with marked degenerative disc disease and vacuum disc phenomenon L5-S1. Retrolisthesis grade 1 is seen of L5 on S1. Some interval disc space narrowing is also noted at L4-5 since prior 2016 exam. IMPRESSION: 1. Bilateral nephromegaly with innumerable cysts of varying density some which are simple and others hyperdense possibly representing hemorrhagic or proteinaceous cysts are identified in conjunction with cystic stable lesions of the liver and abdominal hernias. 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 2. Left-sided nephrolithiasis.  No findings of obstructive uropathy. 3. Left-sided colonic diverticulosis without acute diverticulitis. 4. Lumbar spondylosis with slight interval disc space narrowing at L4-5. Interbody blocks are again seen at L2-3 and L3-4 with slight grade 1 retrolisthesis of L5 on S1. Electronically Signed   By: Ashley Royalty M.D.   On: 12/14/2016 17:57    Procedures Procedures (including critical care time)  Medications Ordered in ED Medications  sodium chloride 0.9 % bolus 1,000 mL (0 mLs Intravenous Stopped 12/14/16 1821)  ondansetron (ZOFRAN) injection 4 mg (4 mg Intravenous Given 12/14/16 1619)  HYDROcodone-acetaminophen (NORCO/VICODIN) 5-325 MG per tablet 1 tablet (1 tablet Oral Given 12/14/16 1910)     Initial Impression / Assessment and Plan / ED Course  I have reviewed the triage vital signs and the nursing notes.  Pertinent labs & imaging results that were available during my care of the patient were reviewed by me and considered in my medical decision making (see chart for details).   patient will be admitted for vomiting and  worsening renal function    Final Clinical Impressions(s) / ED Diagnoses   Final diagnoses:  Pain of upper abdomen    New Prescriptions New Prescriptions   No medications on file     Milton Ferguson, MD 12/14/16 [phone]

## 2016-12-14 NOTE — ED Triage Notes (Signed)
Pt c/o pain on bilateral sides of abdomen, nausea, vomiting, decreased urine output x 3 days. Pt reports he has polycystic kidney disease.

## 2016-12-15 DIAGNOSIS — Q613 Polycystic kidney, unspecified: Secondary | ICD-10-CM | POA: Diagnosis not present

## 2016-12-15 DIAGNOSIS — N189 Chronic kidney disease, unspecified: Secondary | ICD-10-CM

## 2016-12-15 DIAGNOSIS — J449 Chronic obstructive pulmonary disease, unspecified: Secondary | ICD-10-CM

## 2016-12-15 DIAGNOSIS — I10 Essential (primary) hypertension: Secondary | ICD-10-CM | POA: Diagnosis not present

## 2016-12-15 DIAGNOSIS — K625 Hemorrhage of anus and rectum: Secondary | ICD-10-CM | POA: Diagnosis not present

## 2016-12-15 DIAGNOSIS — N179 Acute kidney failure, unspecified: Secondary | ICD-10-CM

## 2016-12-15 DIAGNOSIS — B1920 Unspecified viral hepatitis C without hepatic coma: Secondary | ICD-10-CM

## 2016-12-15 DIAGNOSIS — N185 Chronic kidney disease, stage 5: Secondary | ICD-10-CM

## 2016-12-15 LAB — RENAL FUNCTION PANEL
ALBUMIN: 3 g/dL — AB (ref 3.5–5.0)
ANION GAP: 9 (ref 5–15)
BUN: 35 mg/dL — ABNORMAL HIGH (ref 6–20)
CHLORIDE: 106 mmol/L (ref 101–111)
CO2: 18 mmol/L — ABNORMAL LOW (ref 22–32)
Calcium: 7.7 mg/dL — ABNORMAL LOW (ref 8.9–10.3)
Creatinine, Ser: 5.36 mg/dL — ABNORMAL HIGH (ref 0.61–1.24)
GFR, EST AFRICAN AMERICAN: 12 mL/min — AB (ref >60)
GFR, EST NON AFRICAN AMERICAN: 10 mL/min — AB (ref >60)
Glucose, Bld: 138 mg/dL — ABNORMAL HIGH (ref 65–99)
PHOSPHORUS: 3.9 mg/dL (ref 2.5–4.6)
POTASSIUM: 5.3 mmol/L — AB (ref 3.5–5.1)
Sodium: 133 mmol/L — ABNORMAL LOW (ref 135–145)

## 2016-12-15 LAB — CBC WITH DIFFERENTIAL/PLATELET
BASOS ABS: 0 10*3/uL (ref 0.0–0.1)
BASOS PCT: 0 %
Eosinophils Absolute: 0 10*3/uL (ref 0.0–0.7)
Eosinophils Relative: 0 %
HEMATOCRIT: 39.2 % (ref 39.0–52.0)
HEMOGLOBIN: 13.3 g/dL (ref 13.0–17.0)
Lymphocytes Relative: 4 %
Lymphs Abs: 0.3 10*3/uL — ABNORMAL LOW (ref 0.7–4.0)
MCH: 32 pg (ref 26.0–34.0)
MCHC: 33.9 g/dL (ref 30.0–36.0)
MCV: 94.2 fL (ref 78.0–100.0)
MONO ABS: 0.1 10*3/uL (ref 0.1–1.0)
Monocytes Relative: 2 %
NEUTROS PCT: 94 %
Neutro Abs: 6.9 10*3/uL (ref 1.7–7.7)
Platelets: 210 10*3/uL (ref 150–400)
RBC: 4.16 MIL/uL — AB (ref 4.22–5.81)
RDW: 13.3 % (ref 11.5–15.5)
WBC: 7.3 10*3/uL (ref 4.0–10.5)

## 2016-12-15 MED ORDER — BISACODYL 5 MG PO TBEC
5.0000 mg | DELAYED_RELEASE_TABLET | Freq: Every day | ORAL | Status: DC | PRN
  Administered 2016-12-15: 5 mg via ORAL
  Filled 2016-12-15: qty 1

## 2016-12-15 MED ORDER — MAGNESIUM SULFATE 2 GM/50ML IV SOLN: 2.0000 g | Freq: Once | INTRAVENOUS | Status: DC

## 2016-12-15 MED ORDER — IPRATROPIUM-ALBUTEROL 0.5-2.5 (3) MG/3ML IN SOLN: 3.0000 mL | Freq: Four times a day (QID) | RESPIRATORY_TRACT | Status: DC

## 2016-12-15 MED ORDER — HYDRALAZINE HCL 20 MG/ML IJ SOLN
5.0000 mg | Freq: Four times a day (QID) | INTRAMUSCULAR | Status: DC | PRN
  Administered 2016-12-15 (×2): 5 mg via INTRAVENOUS
  Filled 2016-12-15 (×2): qty 1

## 2016-12-15 MED ORDER — SENNA 8.6 MG PO TABS: 1.0000 | ORAL_TABLET | Freq: Every day | ORAL | Status: DC | PRN

## 2016-12-15 MED ORDER — POTASSIUM PHOSPHATE MONOBASIC 500 MG PO TABS: 500.0000 mg | ORAL_TABLET | Freq: Three times a day (TID) | ORAL | Status: DC

## 2016-12-15 MED ORDER — LEVOFLOXACIN IN D5W 750 MG/150ML IV SOLN: 750.0000 mg | INTRAVENOUS | Status: DC

## 2016-12-15 MED ORDER — SODIUM CHLORIDE 0.9 % IV SOLN
INTRAVENOUS | Status: DC
  Administered 2016-12-15 – 2016-12-19 (×8): via INTRAVENOUS

## 2016-12-15 NOTE — Progress Notes (Signed)
  PROGRESS NOTE  Aaron [name] UEK:[id number] DOB: 04/13/1956 DOA: 12/14/2016 PCP: Deloria Lair., MD  Nephrologist: Pearson Grippe, MD  Brief Narrative: 61 year old man PMH polycystic kidney disease, chronic kidney disease stage IV presented with several day history of worsening bilateral flank pain, nausea, vomiting, decreased urine output. Admitted for acute kidney injury superimposed on chronic kidney disease.  Assessment/Plan Acute kidney injury superimposed on chronic kidney disease unknown stage. Dr. Adin Hector office closed today. -Minimal improvement. Resume IV hydration, obtain records from primary nephrologist office. Repeat basic metabolic panel morning.  Polycystic kidney disease  Nausea and vomiting with bilateral flank pain. -Etiology unclear. Resolved. Suspect gastroenteritis. CT renal stone study without acute findings. -Advance diet  Essential hypertension. -Stable. Continue Cardura.  Cigarette smoker -Nicotine patch  COPD -Appears stable. Bronchodilator as needed.  Hepatitis C   DVT prophylaxis: heparin Code Status: full Family Communication: none Disposition Plan: home    Murray Hodgkins, MD  [facility] Direct contact: [phone] --[address] [place]  --[email or web address]; password [email or web address]  7PM-7AM contact night coverage as above 12/15/2016, 2:38 PM  LOS: 0 days   Consultants:    Procedures:    Antimicrobials:    Interval history/Subjective: Feels a lot better today. Minimal nausea. No vomiting. Some soreness both flanks but much less pain.  Objective: Vitals: Afebrile, 98.5, 20, 74, 167/99, 98% on room air  Exam:     Constitutional: Appears comfortable, calm.  Respiratory. Clear to auscultation bilaterally. No wheezes, rales or rhonchi. Normal respiratory effort.  Cardiovascular. Regular rate and rhythm. No murmur, rub or gallop. No lower extremity edema.  Abdomen. Soft, nontender, perhaps mildly distended.  Psychiatric. Grossly  normal mood and affect. Speech fluent and appropriate.  I have personally reviewed the following:   Labs:  Potassium 5.3, CO2 18, BUN without significant change, 35. Creatinine modestly improved, 5.36. Phosphorus within normal limits.  Imaging studies:  CT renal stone study. Bilateral nephromegaly with innumerable cysts. Stable cystic lesions of the liver. Left-sided nephrolithiasis. No findings of obstructive uropathy.  Scheduled Meds: . aspirin  81 mg Oral Daily  . doxazosin  8 mg Oral Daily  . heparin  5,000 Units Subcutaneous Q8H  . nicotine  21 mg Transdermal Daily  . pantoprazole  40 mg Oral Daily   Continuous Infusions: . sodium chloride 75 mL/hr at 12/15/16 1142    Active Problems:   Essential hypertension   Tobacco abuse   COPD (chronic obstructive pulmonary disease) (HCC)   Polycystic kidney disease   AKI (acute kidney injury) (HCC)   CKD (chronic kidney disease)   Hepatitis C   LOS: 0 days

## 2016-12-15 NOTE — Progress Notes (Signed)
   12/15/16 1616  Vitals  BP (!) 185/88  BP Location Right Arm  BP Method Automatic  Pulse Rate 71  Dr. Sarajane Jews notified via text page of patient's VS.

## 2016-12-15 NOTE — Progress Notes (Signed)
T,OYPD paged and informed of patient manual BP of 190/100.RN also asked MD medication for constipation as requested by patient. Dulcolax ordered

## 2016-12-15 NOTE — Care Management Obs Status (Signed)
[facility] NOTIFICATION   Patient Details  Name: JOHNNELL LIOU MRN: [id number] Date of Birth: [birth year]   Medicare Observation Status Notification Given:  Yes    Sherald Barge, RN 12/15/2016, 12:53 PM

## 2016-12-16 ENCOUNTER — Encounter (HOSPITAL_COMMUNITY): Payer: Self-pay | Admitting: Gastroenterology

## 2016-12-16 DIAGNOSIS — N179 Acute kidney failure, unspecified: Secondary | ICD-10-CM | POA: Diagnosis present

## 2016-12-16 DIAGNOSIS — K219 Gastro-esophageal reflux disease without esophagitis: Secondary | ICD-10-CM | POA: Diagnosis present

## 2016-12-16 DIAGNOSIS — K3189 Other diseases of stomach and duodenum: Secondary | ICD-10-CM | POA: Diagnosis not present

## 2016-12-16 DIAGNOSIS — B1920 Unspecified viral hepatitis C without hepatic coma: Secondary | ICD-10-CM | POA: Diagnosis present

## 2016-12-16 DIAGNOSIS — Z7982 Long term (current) use of aspirin: Secondary | ICD-10-CM | POA: Diagnosis not present

## 2016-12-16 DIAGNOSIS — Z8051 Family history of malignant neoplasm of kidney: Secondary | ICD-10-CM | POA: Diagnosis not present

## 2016-12-16 DIAGNOSIS — K222 Esophageal obstruction: Secondary | ICD-10-CM | POA: Diagnosis not present

## 2016-12-16 DIAGNOSIS — I2510 Atherosclerotic heart disease of native coronary artery without angina pectoris: Secondary | ICD-10-CM | POA: Diagnosis present

## 2016-12-16 DIAGNOSIS — J410 Simple chronic bronchitis: Secondary | ICD-10-CM | POA: Diagnosis not present

## 2016-12-16 DIAGNOSIS — R319 Hematuria, unspecified: Secondary | ICD-10-CM | POA: Diagnosis present

## 2016-12-16 DIAGNOSIS — K319 Disease of stomach and duodenum, unspecified: Secondary | ICD-10-CM | POA: Diagnosis present

## 2016-12-16 DIAGNOSIS — R634 Abnormal weight loss: Secondary | ICD-10-CM | POA: Diagnosis present

## 2016-12-16 DIAGNOSIS — G43A1 Cyclical vomiting, intractable: Secondary | ICD-10-CM | POA: Diagnosis not present

## 2016-12-16 DIAGNOSIS — F17210 Nicotine dependence, cigarettes, uncomplicated: Secondary | ICD-10-CM | POA: Diagnosis present

## 2016-12-16 DIAGNOSIS — Q613 Polycystic kidney, unspecified: Secondary | ICD-10-CM | POA: Diagnosis not present

## 2016-12-16 DIAGNOSIS — B182 Chronic viral hepatitis C: Secondary | ICD-10-CM

## 2016-12-16 DIAGNOSIS — J449 Chronic obstructive pulmonary disease, unspecified: Secondary | ICD-10-CM | POA: Diagnosis present

## 2016-12-16 DIAGNOSIS — I10 Essential (primary) hypertension: Secondary | ICD-10-CM | POA: Diagnosis not present

## 2016-12-16 DIAGNOSIS — R1010 Upper abdominal pain, unspecified: Secondary | ICD-10-CM | POA: Diagnosis present

## 2016-12-16 DIAGNOSIS — J418 Mixed simple and mucopurulent chronic bronchitis: Secondary | ICD-10-CM | POA: Diagnosis not present

## 2016-12-16 DIAGNOSIS — Z800 Family history of malignant neoplasm of digestive organs: Secondary | ICD-10-CM | POA: Diagnosis not present

## 2016-12-16 DIAGNOSIS — R1310 Dysphagia, unspecified: Secondary | ICD-10-CM | POA: Diagnosis not present

## 2016-12-16 DIAGNOSIS — K625 Hemorrhage of anus and rectum: Secondary | ICD-10-CM

## 2016-12-16 DIAGNOSIS — I739 Peripheral vascular disease, unspecified: Secondary | ICD-10-CM | POA: Diagnosis present

## 2016-12-16 DIAGNOSIS — K633 Ulcer of intestine: Secondary | ICD-10-CM | POA: Diagnosis present

## 2016-12-16 DIAGNOSIS — N184 Chronic kidney disease, stage 4 (severe): Secondary | ICD-10-CM | POA: Diagnosis present

## 2016-12-16 DIAGNOSIS — I129 Hypertensive chronic kidney disease with stage 1 through stage 4 chronic kidney disease, or unspecified chronic kidney disease: Secondary | ICD-10-CM | POA: Diagnosis present

## 2016-12-16 DIAGNOSIS — K5731 Diverticulosis of large intestine without perforation or abscess with bleeding: Secondary | ICD-10-CM | POA: Diagnosis present

## 2016-12-16 DIAGNOSIS — N171 Acute kidney failure with acute cortical necrosis: Secondary | ICD-10-CM | POA: Diagnosis not present

## 2016-12-16 DIAGNOSIS — K921 Melena: Secondary | ICD-10-CM | POA: Diagnosis not present

## 2016-12-16 DIAGNOSIS — D649 Anemia, unspecified: Secondary | ICD-10-CM | POA: Diagnosis present

## 2016-12-16 DIAGNOSIS — Z8249 Family history of ischemic heart disease and other diseases of the circulatory system: Secondary | ICD-10-CM | POA: Diagnosis not present

## 2016-12-16 DIAGNOSIS — N189 Chronic kidney disease, unspecified: Secondary | ICD-10-CM | POA: Diagnosis not present

## 2016-12-16 DIAGNOSIS — Z8041 Family history of malignant neoplasm of ovary: Secondary | ICD-10-CM | POA: Diagnosis not present

## 2016-12-16 DIAGNOSIS — K449 Diaphragmatic hernia without obstruction or gangrene: Secondary | ICD-10-CM | POA: Diagnosis present

## 2016-12-16 DIAGNOSIS — E871 Hypo-osmolality and hyponatremia: Secondary | ICD-10-CM | POA: Diagnosis present

## 2016-12-16 LAB — RENAL FUNCTION PANEL
Albumin: 2.7 g/dL — ABNORMAL LOW (ref 3.5–5.0)
Anion gap: 7 (ref 5–15)
BUN: 43 mg/dL — ABNORMAL HIGH (ref 6–20)
CALCIUM: 7.4 mg/dL — AB (ref 8.9–10.3)
CO2: 16 mmol/L — ABNORMAL LOW (ref 22–32)
CREATININE: 5.51 mg/dL — AB (ref 0.61–1.24)
Chloride: 110 mmol/L (ref 101–111)
GFR, EST AFRICAN AMERICAN: 12 mL/min — AB (ref >60)
GFR, EST NON AFRICAN AMERICAN: 10 mL/min — AB (ref >60)
Glucose, Bld: 123 mg/dL — ABNORMAL HIGH (ref 65–99)
Phosphorus: 4.4 mg/dL (ref 2.5–4.6)
Potassium: 4.3 mmol/L (ref 3.5–5.1)
SODIUM: 133 mmol/L — AB (ref 135–145)

## 2016-12-16 LAB — CBC
HCT: 31.7 % — ABNORMAL LOW (ref 39.0–52.0)
HCT: 30.6 % — ABNORMAL LOW (ref 39.0–52.0)
HEMATOCRIT: 32.4 % — AB (ref 39.0–52.0)
HEMATOCRIT: 31.8 % — AB (ref 39.0–52.0)
HEMOGLOBIN: 10.8 g/dL — AB (ref 13.0–17.0)
HEMOGLOBIN: 10.6 g/dL — AB (ref 13.0–17.0)
Hemoglobin: 10.7 g/dL — ABNORMAL LOW (ref 13.0–17.0)
Hemoglobin: 10.4 g/dL — ABNORMAL LOW (ref 13.0–17.0)
MCH: 31.3 pg (ref 26.0–34.0)
MCH: 31.6 pg (ref 26.0–34.0)
MCH: 31.5 pg (ref 26.0–34.0)
MCH: 32 pg (ref 26.0–34.0)
MCHC: 33.3 g/dL (ref 30.0–36.0)
MCHC: 33.6 g/dL (ref 30.0–36.0)
MCHC: 33.4 g/dL (ref 30.0–36.0)
MCHC: 34 g/dL (ref 30.0–36.0)
MCV: 93.9 fL (ref 78.0–100.0)
MCV: 93.8 fL (ref 78.0–100.0)
MCV: 94.3 fL (ref 78.0–100.0)
MCV: 94.2 fL (ref 78.0–100.0)
PLATELETS: 198 10*3/uL (ref 150–400)
PLATELETS: 199 10*3/uL (ref 150–400)
PLATELETS: 195 10*3/uL (ref 150–400)
Platelets: 197 10*3/uL (ref 150–400)
RBC: 3.45 MIL/uL — AB (ref 4.22–5.81)
RBC: 3.39 MIL/uL — ABNORMAL LOW (ref 4.22–5.81)
RBC: 3.36 MIL/uL — ABNORMAL LOW (ref 4.22–5.81)
RBC: 3.25 MIL/uL — AB (ref 4.22–5.81)
RDW: 13.3 % (ref 11.5–15.5)
RDW: 13.5 % (ref 11.5–15.5)
RDW: 13.6 % (ref 11.5–15.5)
RDW: 13.6 % (ref 11.5–15.5)
WBC: 9.3 10*3/uL (ref 4.0–10.5)
WBC: 7.7 10*3/uL (ref 4.0–10.5)
WBC: 8 10*3/uL (ref 4.0–10.5)
WBC: 9 10*3/uL (ref 4.0–10.5)

## 2016-12-16 LAB — CBC WITH DIFFERENTIAL/PLATELET
BASOS PCT: 0 %
Basophils Absolute: 0 10*3/uL (ref 0.0–0.1)
Eosinophils Absolute: 0 10*3/uL (ref 0.0–0.7)
Eosinophils Relative: 0 %
HEMATOCRIT: 35.5 % — AB (ref 39.0–52.0)
HEMOGLOBIN: 12.1 g/dL — AB (ref 13.0–17.0)
LYMPHS ABS: 1 10*3/uL (ref 0.7–4.0)
Lymphocytes Relative: 10 %
MCH: 31.8 pg (ref 26.0–34.0)
MCHC: 34.1 g/dL (ref 30.0–36.0)
MCV: 93.4 fL (ref 78.0–100.0)
MONO ABS: 1 10*3/uL (ref 0.1–1.0)
MONOS PCT: 9 %
NEUTROS ABS: 8.6 10*3/uL — AB (ref 1.7–7.7)
NEUTROS PCT: 81 %
Platelets: 177 10*3/uL (ref 150–400)
RBC: 3.8 MIL/uL — ABNORMAL LOW (ref 4.22–5.81)
RDW: 13.2 % (ref 11.5–15.5)
WBC: 10.7 10*3/uL — ABNORMAL HIGH (ref 4.0–10.5)

## 2016-12-16 LAB — HIV ANTIBODY (ROUTINE TESTING W REFLEX): HIV SCREEN 4TH GENERATION: NONREACTIVE

## 2016-12-16 LAB — TYPE AND SCREEN
ABO/RH(D): O POS
ANTIBODY SCREEN: NEGATIVE

## 2016-12-16 MED ORDER — HYDRALAZINE HCL 20 MG/ML IJ SOLN
10.0000 mg | INTRAMUSCULAR | Status: DC | PRN
  Administered 2016-12-16 – 2016-12-18 (×3): 10 mg via INTRAVENOUS
  Filled 2016-12-16 (×3): qty 1

## 2016-12-16 MED ORDER — MORPHINE SULFATE (PF) 2 MG/ML IV SOLN
2.0000 mg | INTRAVENOUS | Status: DC | PRN
  Administered 2016-12-16 – 2016-12-18 (×10): 2 mg via INTRAVENOUS
  Filled 2016-12-16 (×11): qty 1

## 2016-12-16 MED ORDER — SENNA 8.6 MG PO TABS
1.0000 | ORAL_TABLET | Freq: Every day | ORAL | Status: DC
  Administered 2016-12-17 – 2016-12-19 (×3): 8.6 mg via ORAL
  Filled 2016-12-16 (×3): qty 1

## 2016-12-16 MED ORDER — HYDROMORPHONE HCL 1 MG/ML IJ SOLN
1.0000 mg | Freq: Once | INTRAMUSCULAR | Status: AC
  Administered 2016-12-16: 1 mg via INTRAVENOUS
  Filled 2016-12-16: qty 1

## 2016-12-16 MED ORDER — POLYETHYLENE GLYCOL 3350 17 G PO PACK
17.0000 g | PACK | ORAL | Status: AC
  Administered 2016-12-16 (×3): 17 g via ORAL
  Filled 2016-12-16 (×2): qty 1

## 2016-12-16 MED ORDER — BISACODYL 5 MG PO TBEC
10.0000 mg | DELAYED_RELEASE_TABLET | ORAL | Status: AC
Start: 2016-12-16 — End: 2016-12-16
  Administered 2016-12-16 (×2): 10 mg via ORAL
  Filled 2016-12-16 (×2): qty 2

## 2016-12-16 MED ORDER — METOPROLOL SUCCINATE ER 25 MG PO TB24
25.0000 mg | ORAL_TABLET | Freq: Every day | ORAL | Status: DC
  Administered 2016-12-16 – 2016-12-19 (×4): 25 mg via ORAL
  Filled 2016-12-16 (×5): qty 1

## 2016-12-16 MED ORDER — OXYCODONE-ACETAMINOPHEN 5-325 MG PO TABS
1.0000 | ORAL_TABLET | ORAL | Status: DC | PRN
  Administered 2016-12-16 – 2016-12-19 (×5): 1 via ORAL
  Filled 2016-12-16 (×5): qty 1

## 2016-12-16 MED ORDER — SODIUM CHLORIDE 0.9 % IV SOLN
INTRAVENOUS | Status: DC
  Administered 2016-12-16: 16:00:00 via INTRAVENOUS

## 2016-12-16 MED ORDER — ONDANSETRON HCL 4 MG/2ML IJ SOLN
4.0000 mg | Freq: Once | INTRAMUSCULAR | Status: AC
  Administered 2016-12-16: 4 mg via INTRAVENOUS

## 2016-12-16 MED ORDER — OXYCODONE HCL 5 MG PO TABS
5.0000 mg | ORAL_TABLET | ORAL | Status: DC | PRN
  Administered 2016-12-16 – 2016-12-19 (×5): 5 mg via ORAL
  Filled 2016-12-16 (×5): qty 1

## 2016-12-16 MED ORDER — POLYETHYLENE GLYCOL 3350 17 G PO PACK: 17.0000 g | PACK | Freq: Every day | ORAL | Status: DC

## 2016-12-16 MED ORDER — POLYETHYLENE GLYCOL 3350 17 G PO PACK
17.0000 g | PACK | ORAL | Status: AC
  Administered 2016-12-16 (×3): 17 g via ORAL
  Filled 2016-12-16 (×4): qty 1

## 2016-12-16 MED ORDER — BISACODYL 5 MG PO TBEC
5.0000 mg | DELAYED_RELEASE_TABLET | Freq: Every day | ORAL | Status: DC
  Administered 2016-12-16 – 2016-12-18 (×3): 5 mg via ORAL
  Filled 2016-12-16 (×3): qty 1

## 2016-12-16 NOTE — Consult Note (Signed)
Reason for Consult: Worsening of renal failure Referring Physician: Dr. Rhodia [name] is an 61 y.o. male.  HPI: He is a patient who has history of hypertension, polycystic kidney disease, chronic renal failure late stage IV and hepatitis C positive presently came with complaints of nausea, vomiting and decreased urine output. When he was evaluated his creatinine is slightly higher than his baseline is admitted to the hospital. Presently patient states that he'[name] feeling much better. Patient is being followed by [facility]. Patient presently is not on dialysis. Today he is feeling much better. He denies any difficulty breathing. Appetite is okay.  Past Medical History:  Diagnosis Date  . Anxiety   . CAD (coronary artery disease)    Mild nonobstructive disease at cardiac catheterization 2002  . Chronic back pain   . CKD (chronic kidney disease), stage II   . COPD (chronic obstructive pulmonary disease) ([place])   . Diverticulitis   . Esophageal reflux   . Essential hypertension   . Hepatitis C   . History of syncope   . Hyperlipidemia   . Migraines   . Nephrolithiasis   . PAT (paroxysmal atrial tachycardia) ([place])   . Peripheral arterial disease (HCC)    Occluded left superficial femoral artery status post stent June 2016 - Dr. Trula Slade  . Polycystic kidney, unspecified type   . Syncope 09/2014    Past Surgical History:  Procedure Laterality Date  . BACK SURGERY    . BUNIONECTOMY    . CERVICAL SPINE SURGERY    . HERNIA REPAIR    . PERIPHERAL VASCULAR CATHETERIZATION N/A 09/20/2014   Procedure: Abdominal Aortogram;  Surgeon: Serafina Mitchell, MD;  Location: [facility];  Service: Cardiovascular;  Laterality: N/A;    Family History  Problem Relation Age of Onset  . Alcoholism Mother   . Heart disease Father        Massive heart attack  . Heart attack Father   . Atrial fibrillation Father   . Colon cancer Father   . Colon cancer Maternal  Grandfather 15  . Alcoholism Maternal Grandfather   . Renal cancer Cousin   . Ovarian cancer Sister     Social History:  reports that he has been smoking Cigarettes.  He has a 47.00 pack-year smoking history. He has never used smokeless tobacco. He reports that he does not drink alcohol or use drugs.  Allergies: No Known Allergies  Medications: I have reviewed the patient'[name] current medications.  Results for orders placed or performed during the hospital encounter of 12/14/16 (from the past 48 hour([name]))  Urinalysis, Routine w reflex microscopic     Status: Abnormal   Collection Time: 12/14/16  1:51 PM  Result Value Ref Range   Color, Urine YELLOW YELLOW   APPearance CLEAR CLEAR   Specific Gravity, Urine 1.012 1.005 - 1.030   pH 5.0 5.0 - 8.0   Glucose, UA NEGATIVE NEGATIVE mg/dL   Hgb urine dipstick MODERATE (A) NEGATIVE   Bilirubin Urine NEGATIVE NEGATIVE   Ketones, ur 5 (A) NEGATIVE mg/dL   Protein, ur 100 (A) NEGATIVE mg/dL   Nitrite NEGATIVE NEGATIVE   Leukocytes, UA NEGATIVE NEGATIVE   RBC / HPF 0-5 0 - 5 RBC/hpf   WBC, UA 0-5 0 - 5 WBC/hpf   Bacteria, UA RARE (A) NONE SEEN   Squamous Epithelial / LPF NONE SEEN NONE SEEN   Mucus PRESENT   Lipase, blood     Status: None  Collection Time: 12/14/16  2:01 PM  Result Value Ref Range   Lipase 34 11 - 51 U/L  Comprehensive metabolic panel     Status: Abnormal   Collection Time: 12/14/16  2:01 PM  Result Value Ref Range   Sodium 134 (L) 135 - 145 mmol/L   Potassium 5.0 3.5 - 5.1 mmol/L   Chloride 106 101 - 111 mmol/L   CO2 17 (L) 22 - 32 mmol/L   Glucose, Bld 112 (H) 65 - 99 mg/dL   BUN 33 (H) 6 - 20 mg/dL   Creatinine, Ser 5.53 (H) 0.61 - 1.24 mg/dL   Calcium 8.1 (L) 8.9 - 10.3 mg/dL   Total Protein 7.2 6.5 - 8.1 g/dL   Albumin 3.5 3.5 - 5.0 g/dL   AST 12 (L) 15 - 41 U/L   ALT 7 (L) 17 - 63 U/L   Alkaline Phosphatase 98 38 - 126 U/L   Total Bilirubin 0.4 0.3 - 1.2 mg/dL   GFR calc non Af Amer 10 (L) >60 mL/min   GFR  calc Af Amer 12 (L) >60 mL/min    Comment: (NOTE) The eGFR has been calculated using the CKD EPI equation. This calculation has not been validated in all clinical situations. eGFR'[name] persistently <60 mL/min signify possible Chronic Kidney Disease.    Anion gap 11 5 - 15  CBC     Status: Abnormal   Collection Time: 12/14/16  2:01 PM  Result Value Ref Range   WBC 11.2 (H) 4.0 - 10.5 K/uL   RBC 4.31 4.22 - 5.81 MIL/uL   Hemoglobin 13.8 13.0 - 17.0 g/dL   HCT 40.8 39.0 - 52.0 %   MCV 94.7 78.0 - 100.0 fL   MCH 32.0 26.0 - 34.0 pg   MCHC 33.8 30.0 - 36.0 g/dL   RDW 13.3 11.5 - 15.5 %   Platelets 231 150 - 400 K/uL  HIV antibody (Routine Testing)     Status: None   Collection Time: 12/15/16  6:17 AM  Result Value Ref Range   HIV Screen 4th Generation wRfx Non Reactive Non Reactive    Comment: (NOTE) Performed At: [facility] [address] [place], [place] [id number] Lindon Romp MD PN:[id number]   CBC WITH DIFFERENTIAL     Status: Abnormal   Collection Time: 12/15/16  6:17 AM  Result Value Ref Range   WBC 7.3 4.0 - 10.5 K/uL   RBC 4.16 (L) 4.22 - 5.81 MIL/uL   Hemoglobin 13.3 13.0 - 17.0 g/dL   HCT 39.2 39.0 - 52.0 %   MCV 94.2 78.0 - 100.0 fL   MCH 32.0 26.0 - 34.0 pg   MCHC 33.9 30.0 - 36.0 g/dL   RDW 13.3 11.5 - 15.5 %   Platelets 210 150 - 400 K/uL   Neutrophils Relative % 94 %   Neutro Abs 6.9 1.7 - 7.7 K/uL   Lymphocytes Relative 4 %   Lymphs Abs 0.3 (L) 0.7 - 4.0 K/uL   Monocytes Relative 2 %   Monocytes Absolute 0.1 0.1 - 1.0 K/uL   Eosinophils Relative 0 %   Eosinophils Absolute 0.0 0.0 - 0.7 K/uL   Basophils Relative 0 %   Basophils Absolute 0.0 0.0 - 0.1 K/uL  Renal function panel     Status: Abnormal   Collection Time: 12/15/16  6:17 AM  Result Value Ref Range   Sodium 133 (L) 135 - 145 mmol/L   Potassium 5.3 (H) 3.5 - 5.1 mmol/L   Chloride  106 101 - 111 mmol/L   CO2 18 (L) 22 - 32 mmol/L   Glucose, Bld 138 (H) 65 - 99 mg/dL   BUN 35 (H) 6  - 20 mg/dL   Creatinine, Ser 5.36 (H) 0.61 - 1.24 mg/dL   Calcium 7.7 (L) 8.9 - 10.3 mg/dL   Phosphorus 3.9 2.5 - 4.6 mg/dL   Albumin 3.0 (L) 3.5 - 5.0 g/dL   GFR calc non Af Amer 10 (L) >60 mL/min   GFR calc Af Amer 12 (L) >60 mL/min    Comment: (NOTE) The eGFR has been calculated using the CKD EPI equation. This calculation has not been validated in all clinical situations. eGFR'[name] persistently <60 mL/min signify possible Chronic Kidney Disease.    Anion gap 9 5 - 15  Renal function panel     Status: Abnormal   Collection Time: 12/16/16  2:01 AM  Result Value Ref Range   Sodium 133 (L) 135 - 145 mmol/L   Potassium 4.3 3.5 - 5.1 mmol/L    Comment: DELTA CHECK NOTED   Chloride 110 101 - 111 mmol/L   CO2 16 (L) 22 - 32 mmol/L   Glucose, Bld 123 (H) 65 - 99 mg/dL   BUN 43 (H) 6 - 20 mg/dL   Creatinine, Ser 5.51 (H) 0.61 - 1.24 mg/dL   Calcium 7.4 (L) 8.9 - 10.3 mg/dL   Phosphorus 4.4 2.5 - 4.6 mg/dL   Albumin 2.7 (L) 3.5 - 5.0 g/dL   GFR calc non Af Amer 10 (L) >60 mL/min   GFR calc Af Amer 12 (L) >60 mL/min    Comment: (NOTE) The eGFR has been calculated using the CKD EPI equation. This calculation has not been validated in all clinical situations. eGFR'[name] persistently <60 mL/min signify possible Chronic Kidney Disease.    Anion gap 7 5 - 15  CBC with Differential/Platelet     Status: Abnormal   Collection Time: 12/16/16  2:01 AM  Result Value Ref Range   WBC 10.7 (H) 4.0 - 10.5 K/uL   RBC 3.80 (L) 4.22 - 5.81 MIL/uL   Hemoglobin 12.1 (L) 13.0 - 17.0 g/dL   HCT 35.5 (L) 39.0 - 52.0 %   MCV 93.4 78.0 - 100.0 fL   MCH 31.8 26.0 - 34.0 pg   MCHC 34.1 30.0 - 36.0 g/dL   RDW 13.2 11.5 - 15.5 %   Platelets 177 150 - 400 K/uL   Neutrophils Relative % 81 %   Neutro Abs 8.6 (H) 1.7 - 7.7 K/uL   Lymphocytes Relative 10 %   Lymphs Abs 1.0 0.7 - 4.0 K/uL   Monocytes Relative 9 %   Monocytes Absolute 1.0 0.1 - 1.0 K/uL   Eosinophils Relative 0 %   Eosinophils Absolute 0.0 0.0  - 0.7 K/uL   Basophils Relative 0 %   Basophils Absolute 0.0 0.0 - 0.1 K/uL  Type and screen [facility]     Status: None   Collection Time: 12/16/16  2:01 AM  Result Value Ref Range   ABO/RH(D) O POS    Antibody Screen NEG    Sample Expiration 12/19/2016   CBC     Status: Abnormal   Collection Time: 12/16/16  8:35 AM  Result Value Ref Range   WBC 9.3 4.0 - 10.5 K/uL   RBC 3.45 (L) 4.22 - 5.81 MIL/uL   Hemoglobin 10.8 (L) 13.0 - 17.0 g/dL   HCT 32.4 (L) 39.0 - 52.0 %   MCV 93.9 78.0 -  100.0 fL   MCH 31.3 26.0 - 34.0 pg   MCHC 33.3 30.0 - 36.0 g/dL   RDW 13.3 11.5 - 15.5 %   Platelets 197 150 - 400 K/uL    Ct Renal Stone Study  Result Date: 12/14/2016 CLINICAL DATA:  Bilateral lower abdominal pain, nausea and vomiting with decreased urinary output x3 days. EXAM: CT ABDOMEN AND PELVIS WITHOUT CONTRAST TECHNIQUE: Multidetector CT imaging of the abdomen and pelvis was performed following the standard protocol without IV contrast. COMPARISON:  09/23/2014 CT FINDINGS: Lower chest: Normal size heart. Dependent atelectasis at each lung base right greater than left. No effusion. Hepatobiliary: Multiple small cystic lesions are again noted without dominant mass. No biliary dilatation. The gallbladder is nondistended and free of stones. Pancreas: No ductal dilatation or mass. Spleen: Normal Adrenals/Urinary Tract: Normal bilateral adrenal glands. Bilateral nephromegaly secondary to innumerable cysts of varying density consistent with polycystic renal disease. No obstructive uropathy. There are nonobstructing left-sided renal calculi. No hydroureteronephrosis. The urinary bladder is unremarkable. Stomach/Bowel: There is descending and sigmoid diverticulosis without acute diverticulitis. No bowel obstruction. Normal small bowel rotation. Nondistended stomach. Normal appendix. Vascular/Lymphatic: Aortoiliac and branch vessel atherosclerosis without aneurysm. No lymphadenopathy. Reproductive:  Prostate is unremarkable. Other: Fat containing inguinal hernias bilaterally left lateral fat containing hernia is also seen in the upper abdomen. Musculoskeletal: Interbody blocks noted at the L2-3 and L3-4 interspace with marked degenerative disc disease and vacuum disc phenomenon L5-S1. Retrolisthesis grade 1 is seen of L5 on S1. Some interval disc space narrowing is also noted at L4-5 since prior 2016 exam. IMPRESSION: 1. Bilateral nephromegaly with innumerable cysts of varying density some which are simple and others hyperdense possibly representing hemorrhagic or proteinaceous cysts are identified in conjunction with cystic stable lesions of the liver and abdominal hernias. 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 2. Left-sided nephrolithiasis.  No findings of obstructive uropathy. 3. Left-sided colonic diverticulosis without acute diverticulitis. 4. Lumbar spondylosis with slight interval disc space narrowing at L4-5. Interbody blocks are again seen at L2-3 and L3-4 with slight grade 1 retrolisthesis of L5 on S1. Electronically Signed   By: Ashley Royalty M.D.   On: 12/14/2016 17:57    Review of Systems  Constitutional: Negative for fever.  Respiratory: Negative for sputum production and shortness of breath.   Gastrointestinal: Positive for abdominal pain, nausea and vomiting.  Genitourinary: Positive for hematuria.  Musculoskeletal: Positive for back pain and neck pain.  Neurological: Negative for weakness.   Blood pressure (!) 144/119, pulse 87, temperature 98.6 F (37 C), resp. rate 16, height '6\' 4"'  (1.93 m), weight 88.3 kg (194 lb 10.7 oz), SpO2 100 %. Physical Exam  Constitutional: He is oriented to person, place, and time. No distress.  Eyes: No scleral icterus.  Neck: No JVD present.  Cardiovascular: Normal rate and regular rhythm.   No murmur heard. Respiratory: No respiratory distress. He has no rales.  GI: He exhibits distension. There is no  tenderness. There is no rebound.  Musculoskeletal: He exhibits no edema.  Neurological: He is alert and oriented to person, place, and time.    Assessment/Plan: Problem #1 renal failure: Possibly acute on chronic versus natural progression of his disease. According to the patient his EGFR was ranging between 14 and 17. Presently his GFR is lower than that. Patient might have a component of renal syndrome as she had poor by mouth intake, nausea and vomiting. At this moment he seems to be feeling better. Etiology  was thought to be secondary to polycystic kidney disease. Problem #2 hematuria by history: Most likely from ruptured kidney cyst versus passing a kidney stone.. Patient denies any fever or chills or sweating. He has some lower abdominal pain which she is getting better. His UA at this moment didn't show any blood in the urine or any bacteria. Problem #3 hypertension: His blood pressure is reasonably controlled Problem #4 history of COPD Problem #5 history of peripheral vascular disease: Status post left femoral stent placement Problem #6 anemia: His hemoglobin is within our target goal Problem #7 history of nonobstructive coronary artery disease Plan: Agree with hydration 2] we check his UA in the morning 3] we check his renal panel and CBC in the morning  Aaron [name] 12/16/2016, 9:40 AM

## 2016-12-16 NOTE — Progress Notes (Signed)
Patient had a BM that had frank red blood mixed with brown stool. Also c/o LLQ pain rating @ 8/10. Dr Olevia Bowens paged and informed of above findings. New orders initiated

## 2016-12-16 NOTE — Progress Notes (Signed)
Blood pressure continues to be elevated@ 198/102 manually after the administration of PRN hydralazine. T.Oypd paged and informed at this time of BP readings/ new order initiated

## 2016-12-16 NOTE — Progress Notes (Signed)
  PROGRESS NOTE  Aaron [name] TWS:[id number] DOB: Nov 03, 1955 DOA: 12/14/2016 PCP: Deloria Lair., MD  Nephrologist: Pearson Grippe, MD  Brief Narrative: 61 year old man PMH polycystic kidney disease, chronic kidney disease stage IV presented with several day history of worsening bilateral flank pain, nausea, vomiting, decreased urine output. Admitted for acute kidney injury superimposed on chronic kidney disease. Seen by nephrology. Developed rectal bleeding, seen by GI.  Assessment/Plan Acute kidney injury superimposed on chronic kidney disease unknown stage.  -Renal function somewhat worse today. Evaluated by nephrology. Continue IV fluids. Renal panel in the morning.  Polycystic kidney disease  Rectal bleeding. Etiology unclear. Known history of diverticulosis. Ischemic colitis also considered. -Thus far hemoglobin stable after 1 g drop. Per gastroenterology, plan for colonoscopy 9/5. Continue to trend hemoglobin.  Nausea and vomiting with bilateral flank pain. -Nausea and vomiting resolved. Now just has predominantly left-sided flank pain. Further evaluation with colonoscopy 9/5. CT renal stone study on admission showed no acute findings to explain pain.  Essential hypertension. -Labile. Was high yesterday. Low this morning. Continue doxazosin, metoprolol, follow.  Cigarette smoker -Nicotine patch  COPD -Appears stable. Continue bronchodilators as needed.  Hepatitis C  Renal function worsening and now with rectal bleeding. Nephrology and gastroenterology following. Colonoscopy planned tomorrow.   DVT prophylaxis: heparin Code Status: full Family Communication: none Disposition Plan: home    Murray Hodgkins, MD  [facility] Direct contact: [phone] --Via [place]  --[email or web address]; password [email or web address]  7PM-7AM contact night coverage as above 12/16/2016, 4:20 PM  LOS: 0 days   Consultants:    Procedures:    Antimicrobials:    Interval  history/Subjective: Had a rough night. Started having rectal bleeding overnight. At least 5 episodes including one this morning. No previous history of bleeding. Has had a colonoscopy in the past. Still has some flank pain more in the left now.  Objective: Vitals: Afebrile, 98.6, 16, 87, 144/119, 97% on room air  Exam:     Constitutional. Appears calm, comfortable.  Respiratory. Clear to auscultation bilaterally. No wheezes, rales or rhonchi. Normal respiratory effort.  Cardiovascular. Regular rate and rhythm. No murmur, rub or gallop. No lower extremity edema.  Abdomen. Soft, nontender except for some mild left-sided tenderness.  Psychiatric. Grossly normal mood and affect. Speech fluent and appropriate.  I have personally reviewed the following:  Urine output 1200 Labs:  Creatinine increased, 5.51. BUN increase, 43. CO2 16.  Hemoglobin dropped to 10.8 but has been stable over the last 7 hours.   Scheduled Meds: . aspirin  81 mg Oral Daily  . bisacodyl  5 mg Oral QHS  . doxazosin  8 mg Oral Daily  . metoprolol succinate  25 mg Oral Daily  . nicotine  21 mg Transdermal Daily  . pantoprazole  40 mg Oral Daily  . polyethylene glycol  17 g Oral Q1 Hr x 6  . senna  1 tablet Oral Daily   Continuous Infusions: . sodium chloride 125 mL/hr at 12/16/16 0950  . sodium chloride 20 mL/hr at 12/16/16 1551    Principal Problem:   AKI (acute kidney injury) ([place]) Active Problems:   Essential hypertension   Tobacco abuse   COPD (chronic obstructive pulmonary disease) (HCC)   Polycystic kidney disease   CKD (chronic kidney disease)   Hepatitis C   Rectal bleeding   LOS: 0 days

## 2016-12-16 NOTE — Progress Notes (Signed)
Dr. Oneida Alar notified about patient nausea and vomiting upon administration of Miralax, Dr. Oneida Alar ordered one time dose of Zofran 4 mg IV now and Miralax every 2 hours for up to three doses. Dr. Oneida Alar is aware doses may not all be in before midnight.

## 2016-12-16 NOTE — Plan of Care (Signed)
Night floor coverage  The staff has notified me that the patient had bright red blood per rectum after bowel movement with complaints of left lower quadrant pain, which he rates at 8/10. He has been complaining of constipation. Also his blood pressure has been elevated, with a systolic BP often measuring in the 180s to 200s.   Plan: Hematochezia:  Will make the patient patient nothing by mouth. Check CBC, type and screen and obtain morning labs now Make stool softeners and Dulcolax scheduled, instead of PRN. Hydromorphone 1 mg 1 dose now.  Uncontrolled hypertension: Start metoprolol succinate 25 mg by mouth daily. Continue Cardura 8 mg by mouth daily. Continue hydralazine 10 mg IVP every 4 hours as needed. Monitor blood pressure.  Tennis Must, M.D. [phone]

## 2016-12-16 NOTE — Consult Note (Signed)
Referring Provider: Dr. Sarajane Jews  Primary Care Physician:  Deloria Lair., MD Primary Gastroenterologist:  Dr. Oneida Alar   Date of Admission: 12/14/16 Date of Consultation: 12/16/16  Reason for Consultation:  Rectal bleeding   HPI:  Aaron [name] is a 61 y.o. year old male with history of polycystic kidney disease, chronic kidney disease, presenting to ED on 9/2 with worsening flank pain, nausea, vomiting, and decreased urine output. Found to have acute on chronic kidney injury, possible gastroenteritis, and CT completed on admission as noted below.   He started having rectal bleeding overnight, approximately 5-6 episodes. Last episode around 9am this morning. Noted LLQ discomfort/cramping that was relieved with BMs. Denies NSAIDs or aspirin powders. Takes an 81 mg aspirin daily. Unintentional weight loss noted over the past 6 months per patient, stating he used to weigh 245 and now weighs in the 190s. Notes reflux at night, no dysphagia. He was hoping he could go home, then the bleeding overnight started. CBC yesterday with Hgb 13.3, with drop to 10.8 as of this morning.   Last colonoscopy in 2008 with  rare sigmoid colon diverticulosis, internal hemorrhoids,. EGD also at that time with normal esophagus without evidence of Barrett's, antritis and duodenitis. Path with H.pylori gastritis. He is unsure if he was ever treated for this.   Also reports history of Hepatitis C, which he states he just found out. Treatment naive.   Past Medical History:  Diagnosis Date  . Anxiety   . CAD (coronary artery disease)    Mild nonobstructive disease at cardiac catheterization 2002  . Chronic back pain   . CKD (chronic kidney disease), stage II   . COPD (chronic obstructive pulmonary disease) ([place])   . Diverticulitis   . Esophageal reflux   . Essential hypertension   . Hepatitis C   . History of syncope   . Hyperlipidemia   . Migraines   . Nephrolithiasis   . PAT (paroxysmal atrial  tachycardia) ([place])   . Peripheral arterial disease (HCC)    Occluded left superficial femoral artery status post stent June 2016 - Dr. Trula Slade  . Polycystic kidney, unspecified type   . Syncope 09/2014    Past Surgical History:  Procedure Laterality Date  . BACK SURGERY    . BUNIONECTOMY    . CERVICAL SPINE SURGERY    . COLONOSCOPY  2008   Dr. Oneida Alar: rare sigmoid colon diverticulosis, internal hemorrhoids.   . ESOPHAGOGASTRODUODENOSCOPY  2008   Dr. Oneida Alar: normal esophagus without Barrett's, antritis and duodenitis, path with H.pylori gastritis  . HERNIA REPAIR     umbilical  . PERIPHERAL VASCULAR CATHETERIZATION N/A 09/20/2014   Procedure: Abdominal Aortogram;  Surgeon: Serafina Mitchell, MD;  Location: [facility];  Service: Cardiovascular;  Laterality: N/A;    Prior to Admission medications   Medication Sig Start Date End Date Taking? Authorizing Provider  albuterol (PROVENTIL HFA) 108 (90 BASE) MCG/ACT inhaler Inhale 2 puffs into the lungs every 6 (six) hours as needed for wheezing or shortness of breath.    Yes [provider]  aspirin 81 MG tablet Take 81 mg by mouth daily.     Yes [provider]  doxazosin (CARDURA) 8 MG tablet Take 8 mg by mouth daily.   Yes [provider]  HYDROcodone-acetaminophen (NORCO/VICODIN) 5-325 MG tablet Take 1 tablet by mouth every 4 (four) hours as needed. 01/01/16  Yes Lily Kocher, PA-C  omeprazole (PRILOSEC) 20 MG capsule Take 20 mg  by mouth daily.     Yes [provider]  oxyCODONE-acetaminophen (PERCOCET) 10-325 MG per tablet Take 1 tablet by mouth every 3 (three) hours.    Yes [provider]  torsemide (DEMADEX) 20 MG tablet Take 20 mg by mouth daily.   Yes [provider]    Current Facility-Administered Medications  Medication Dose Route Frequency Provider Last Rate Last Dose  . 0.9 %  sodium chloride infusion   Intravenous Continuous Fran Lowes, MD 125 mL/hr at  12/16/16 0950    . albuterol (PROVENTIL) (2.5 MG/3ML) 0.083% nebulizer solution 2.5 mg  2.5 mg Nebulization Q4H PRN Reubin Milan, MD      . aspirin chewable tablet 81 mg  81 mg Oral Daily Reubin Milan, MD   81 mg at 12/16/16 3664  . bisacodyl (DULCOLAX) EC tablet 5 mg  5 mg Oral QHS Reubin Milan, MD      . doxazosin (CARDURA) tablet 8 mg  8 mg Oral Daily Reubin Milan, MD   8 mg at 12/16/16 4034  . hydrALAZINE (APRESOLINE) injection 10 mg  10 mg Intravenous Q4H PRN Vianne Bulls, MD   10 mg at 12/16/16 0925  . metoprolol succinate (TOPROL-XL) 24 hr tablet 25 mg  25 mg Oral Daily Reubin Milan, MD   25 mg at 12/16/16 0150  . nicotine (NICODERM CQ - dosed in mg/24 hours) patch 21 mg  21 mg Transdermal Daily Reubin Milan, MD   21 mg at 12/16/16 7425  . ondansetron (ZOFRAN) tablet 4 mg  4 mg Oral Q6H PRN Reubin Milan, MD       Or  . ondansetron [facility]) injection 4 mg  4 mg Intravenous Q6H PRN Reubin Milan, MD   4 mg at 12/16/16 0201  . oxyCODONE-acetaminophen (PERCOCET/ROXICET) 5-325 MG per tablet 1 tablet  1 tablet Oral Q3H PRN Reubin Milan, MD   1 tablet at 12/16/16 9563   And  . oxyCODONE (Oxy IR/ROXICODONE) immediate release tablet 5 mg  5 mg Oral Q3H PRN Reubin Milan, MD   5 mg at 12/15/16 2216  . pantoprazole (PROTONIX) EC tablet 40 mg  40 mg Oral Daily Reubin Milan, MD   40 mg at 12/16/16 8756  . senna (SENOKOT) tablet 8.6 mg  1 tablet Oral Daily Reubin Milan, MD      . traZODone (DESYREL) tablet 50 mg  50 mg Oral QHS PRN Reubin Milan, MD        Allergies as of 12/14/2016  . (No Known Allergies)    Family History  Problem Relation Age of Onset  . Alcoholism Mother   . Heart disease Father        Massive heart attack  . Heart attack Father   . Atrial fibrillation Father   . Colon cancer Father   . Colon cancer Maternal Grandfather 65  . Alcoholism Maternal Grandfather   . Renal cancer Cousin    . Ovarian cancer Sister     Social History   Social History  . Marital status: Legally Separated    Spouse name: N/A  . Number of children: 2  . Years of education: N/A   Occupational History  . DISABLED    Social History Main Topics  . Smoking status: Current Every Day Smoker    Packs/day: 1.50    Years: 47.00    Types: Cigarettes  . Smokeless tobacco: Never Used  . Alcohol use No  .  Drug use: No     Comment: states in remote past he used "whatever was around"  . Sexual activity: Not on file   Other Topics Concern  . Not on file   Social History Narrative   Disabled from Back since 2007    Review of Systems: Gen: see HPI  CV: Denies chest pain, heart palpitations, syncope, edema  Resp: Denies shortness of breath with rest, cough, wheezing GI: see HPI  GU : see HPI  MS: +joint pain  Derm: Denies rash, itching, dry skin Psych: Denies depression, anxiety,confusion, or memory loss Heme: see HPI   Physical Exam: Vital signs in last 24 hours: Temp:  [97.7 F (36.5 C)-98.6 F (37 C)] 98.6 F (37 C) (09/04 0900) Pulse Rate:  [71-87] 87 (09/04 0900) Resp:  [14-20] 16 (09/04 0900) BP: (95-198)/(58-119) 144/119 (09/04 0900) SpO2:  [97 %-100 %] 100 % (09/04 0900) Last BM Date: 12/15/16 General:   Alert,  Well-developed, well-nourished, pleasant and cooperative in NAD Head:  Normocephalic and atraumatic. Eyes:  Sclera clear, no icterus.   Conjunctiva pink. Ears:  Normal auditory acuity. Nose:  No deformity, discharge,  or lesions. Mouth:  No deformity or lesions, dentition normal. Lungs:  Clear throughout to auscultation.   Heart:  S1 S2 present without murmurs  Abdomen:  +BS, round, distended but soft, possible ventral hernia vs significant rectus diastasis, normal bowel sounds. Mild to moderate TTP LLQ Rectal:  Deferred until time of colonoscopy.   Msk:  Symmetrical without gross deformities. Normal posture. Extremities:  Without edema. Neurologic:  Alert  and  oriented x4 Skin:  Intact without significant lesions or rashes. Psych:  Alert and cooperative. Normal mood and affect.  Intake/Output from previous day: 09/03 0701 - 09/04 0700 In: 1036.5 [P.O.:714; I.V.:322.5] Out: 1200 [Urine:1200] Intake/Output this shift: No intake/output data recorded.  Lab Results:  Recent Labs  12/16/16 0201 12/16/16 0835 12/16/16 1153  WBC 10.7* 9.3 7.7  HGB 12.1* 10.8* 10.7*  HCT 35.5* 32.4* 31.8*  PLT 177 197 198   BMET  Recent Labs  12/14/16 1401 12/15/16 0617 12/16/16 0201  NA 134* 133* 133*  K 5.0 5.3* 4.3  CL 106 106 110  CO2 17* 18* 16*  GLUCOSE 112* 138* 123*  BUN 33* 35* 43*  CREATININE 5.53* 5.36* 5.51*  CALCIUM 8.1* 7.7* 7.4*   LFT  Recent Labs  12/14/16 1401 12/15/16 0617 12/16/16 0201  PROT 7.2  --   --   ALBUMIN 3.5 3.0* 2.7*  AST 12*  --   --   ALT 7*  --   --   ALKPHOS 98  --   --   BILITOT 0.4  --   --     Studies/Results: Ct Renal Stone Study  Result Date: 12/14/2016 CLINICAL DATA:  Bilateral lower abdominal pain, nausea and vomiting with decreased urinary output x3 days. EXAM: CT ABDOMEN AND PELVIS WITHOUT CONTRAST TECHNIQUE: Multidetector CT imaging of the abdomen and pelvis was performed following the standard protocol without IV contrast. COMPARISON:  09/23/2014 CT FINDINGS: Lower chest: Normal size heart. Dependent atelectasis at each lung base right greater than left. No effusion. Hepatobiliary: Multiple small cystic lesions are again noted without dominant mass. No biliary dilatation. The gallbladder is nondistended and free of stones. Pancreas: No ductal dilatation or mass. Spleen: Normal Adrenals/Urinary Tract: Normal bilateral adrenal glands. Bilateral nephromegaly secondary to innumerable cysts of varying density consistent with polycystic renal disease. No obstructive uropathy. There are nonobstructing left-sided renal calculi. No hydroureteronephrosis.  The urinary bladder is unremarkable.  Stomach/Bowel: There is descending and sigmoid diverticulosis without acute diverticulitis. No bowel obstruction. Normal small bowel rotation. Nondistended stomach. Normal appendix. Vascular/Lymphatic: Aortoiliac and branch vessel atherosclerosis without aneurysm. No lymphadenopathy. Reproductive: Prostate is unremarkable. Other: Fat containing inguinal hernias bilaterally left lateral fat containing hernia is also seen in the upper abdomen. Musculoskeletal: Interbody blocks noted at the L2-3 and L3-4 interspace with marked degenerative disc disease and vacuum disc phenomenon L5-S1. Retrolisthesis grade 1 is seen of L5 on S1. Some interval disc space narrowing is also noted at L4-5 since prior 2016 exam. IMPRESSION: 1. Bilateral nephromegaly with innumerable cysts of varying density some which are simple and others hyperdense possibly representing hemorrhagic or proteinaceous cysts are identified in conjunction with cystic stable lesions of the liver and abdominal hernias. 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 2. Left-sided nephrolithiasis.  No findings of obstructive uropathy. 3. Left-sided colonic diverticulosis without acute diverticulitis. 4. Lumbar spondylosis with slight interval disc space narrowing at L4-5. Interbody blocks are again seen at L2-3 and L3-4 with slight grade 1 retrolisthesis of L5 on S1. Electronically Signed   By: Ashley Royalty M.D.   On: 12/14/2016 17:57    Impression: 61 year old male admitted with acute on chronic kidney injury with new onset rectal bleeding overnight with resulting 3 gram drop in Hgb. Known history of diverticulosis, and last colonoscopy in 2008 with diverticulosis and internal hemorrhoids. EGD also done at that time with H.pylori gastritis. Query diverticular bleed, less likely ischemic, AVM, malignancy. Discussed with Dr. Oneida Alar and will plan on colonoscopy and EGD on 9/5 with Dr. Gala Romney.   Hepatitis C: per patient, he was  just informed of this diagnosis. I do not see Hep C antibody on file. Will order RNA with genotype. Further evaluation as outpatient.   Plan: Clear liquids today Follow H/H Colonoscopy 9/5 with Dr. Gala Romney due to rectal bleeding, with EGD for Barrett's screening with Propofol due to remote history of drug use. He is a poor historian but denies any drug use for many years. As of note, history of H.pylori gastritis, and he is unsure if he completed treatment.  Check HCV RNA and genotype with further evaluation as outpatient   Annitta Needs, PhD, ANP-BC [facility]     LOS: 0 days    12/16/2016, 1:02 PM

## 2016-12-17 ENCOUNTER — Inpatient Hospital Stay (HOSPITAL_COMMUNITY): Payer: Medicare Other | Admitting: Anesthesiology

## 2016-12-17 ENCOUNTER — Encounter (HOSPITAL_COMMUNITY): Payer: Self-pay | Admitting: *Deleted

## 2016-12-17 ENCOUNTER — Encounter (HOSPITAL_COMMUNITY)
Admission: EM | Disposition: A | Discharge status: Home / Self Care | Payer: Self-pay | Source: Home / Self Care | Attending: Internal Medicine

## 2016-12-17 DIAGNOSIS — J410 Simple chronic bronchitis: Secondary | ICD-10-CM

## 2016-12-17 DIAGNOSIS — N179 Acute kidney failure, unspecified: Secondary | ICD-10-CM

## 2016-12-17 DIAGNOSIS — R1310 Dysphagia, unspecified: Secondary | ICD-10-CM

## 2016-12-17 DIAGNOSIS — R1010 Upper abdominal pain, unspecified: Secondary | ICD-10-CM

## 2016-12-17 DIAGNOSIS — K222 Esophageal obstruction: Secondary | ICD-10-CM

## 2016-12-17 DIAGNOSIS — K633 Ulcer of intestine: Secondary | ICD-10-CM

## 2016-12-17 DIAGNOSIS — K921 Melena: Secondary | ICD-10-CM

## 2016-12-17 DIAGNOSIS — I10 Essential (primary) hypertension: Secondary | ICD-10-CM

## 2016-12-17 DIAGNOSIS — K3189 Other diseases of stomach and duodenum: Secondary | ICD-10-CM

## 2016-12-17 HISTORY — PX: BIOPSY: SHX5522

## 2016-12-17 HISTORY — PX: COLONOSCOPY WITH PROPOFOL: SHX5780

## 2016-12-17 HISTORY — PX: ESOPHAGEAL DILATION: SHX303

## 2016-12-17 HISTORY — PX: ESOPHAGOGASTRODUODENOSCOPY (EGD) WITH PROPOFOL: SHX5813

## 2016-12-17 LAB — COMPREHENSIVE METABOLIC PANEL
ALBUMIN: 2.6 g/dL — AB (ref 3.5–5.0)
ALT: 6 U/L — ABNORMAL LOW (ref 17–63)
AST: 8 U/L — AB (ref 15–41)
Alkaline Phosphatase: 64 U/L (ref 38–126)
Anion gap: 10 (ref 5–15)
BUN: 46 mg/dL — AB (ref 6–20)
CHLORIDE: 109 mmol/L (ref 101–111)
CO2: 14 mmol/L — AB (ref 22–32)
Calcium: 7.2 mg/dL — ABNORMAL LOW (ref 8.9–10.3)
Creatinine, Ser: 5.64 mg/dL — ABNORMAL HIGH (ref 0.61–1.24)
GFR calc Af Amer: 11 mL/min — ABNORMAL LOW (ref >60)
GFR calc non Af Amer: 10 mL/min — ABNORMAL LOW (ref >60)
GLUCOSE: 87 mg/dL (ref 65–99)
POTASSIUM: 5.1 mmol/L (ref 3.5–5.1)
SODIUM: 133 mmol/L — AB (ref 135–145)
Total Bilirubin: 0.2 mg/dL — ABNORMAL LOW (ref 0.3–1.2)
Total Protein: 5.2 g/dL — ABNORMAL LOW (ref 6.5–8.1)

## 2016-12-17 LAB — RENAL FUNCTION PANEL
ANION GAP: 8 (ref 5–15)
Albumin: 2.5 g/dL — ABNORMAL LOW (ref 3.5–5.0)
BUN: 46 mg/dL — AB (ref 6–20)
CALCIUM: 7.2 mg/dL — AB (ref 8.9–10.3)
CHLORIDE: 110 mmol/L (ref 101–111)
CO2: 16 mmol/L — AB (ref 22–32)
CREATININE: 5.54 mg/dL — AB (ref 0.61–1.24)
GFR calc Af Amer: 12 mL/min — ABNORMAL LOW (ref >60)
GFR calc non Af Amer: 10 mL/min — ABNORMAL LOW (ref >60)
Glucose, Bld: 92 mg/dL (ref 65–99)
Phosphorus: 4.5 mg/dL (ref 2.5–4.6)
Potassium: 5.1 mmol/L (ref 3.5–5.1)
SODIUM: 134 mmol/L — AB (ref 135–145)

## 2016-12-17 LAB — CBC
HCT: 28.7 % — ABNORMAL LOW (ref 39.0–52.0)
HEMOGLOBIN: 9.5 g/dL — AB (ref 13.0–17.0)
MCH: 31.3 pg (ref 26.0–34.0)
MCHC: 33.1 g/dL (ref 30.0–36.0)
MCV: 94.4 fL (ref 78.0–100.0)
PLATELETS: 196 10*3/uL (ref 150–400)
RBC: 3.04 MIL/uL — ABNORMAL LOW (ref 4.22–5.81)
RDW: 13.8 % (ref 11.5–15.5)
WBC: 7.8 10*3/uL (ref 4.0–10.5)

## 2016-12-17 LAB — HCV RNA QUANT RFLX ULTRA OR GENOTYP
HCV RNA QNT(LOG COPY/ML): UNDETERMINED {Log_IU}/mL
HEPATITIS C QUANTITATION: NOT DETECTED [IU]/mL

## 2016-12-17 SURGERY — COLONOSCOPY WITH PROPOFOL
Anesthesia: Monitor Anesthesia Care

## 2016-12-17 MED ORDER — FENTANYL CITRATE (PF) 100 MCG/2ML IJ SOLN
INTRAMUSCULAR | Status: AC
  Filled 2016-12-17: qty 2

## 2016-12-17 MED ORDER — LIDOCAINE VISCOUS 2 % MT SOLN
3.0000 mL | OROMUCOSAL | Status: DC | PRN
  Administered 2016-12-17: 3 mL via OROMUCOSAL

## 2016-12-17 MED ORDER — PROPOFOL 10 MG/ML IV BOLUS
INTRAVENOUS | Status: AC
  Filled 2016-12-17: qty 20

## 2016-12-17 MED ORDER — SODIUM CHLORIDE 0.9% FLUSH
INTRAVENOUS | Status: AC
  Filled 2016-12-17: qty 10

## 2016-12-17 MED ORDER — PROPOFOL 10 MG/ML IV BOLUS
INTRAVENOUS | Status: DC | PRN
  Administered 2016-12-17 (×4): 20 mg via INTRAVENOUS

## 2016-12-17 MED ORDER — MIDAZOLAM HCL 2 MG/2ML IJ SOLN
INTRAMUSCULAR | Status: AC
  Filled 2016-12-17: qty 2

## 2016-12-17 MED ORDER — EPHEDRINE SULFATE 50 MG/ML IJ SOLN
INTRAMUSCULAR | Status: AC
  Filled 2016-12-17: qty 1

## 2016-12-17 MED ORDER — LACTATED RINGERS IV SOLN
INTRAVENOUS | Status: DC
  Administered 2016-12-17: 12:00:00 via INTRAVENOUS

## 2016-12-17 MED ORDER — PROPOFOL 10 MG/ML IV BOLUS
INTRAVENOUS | Status: AC
  Filled 2016-12-17: qty 40

## 2016-12-17 MED ORDER — SODIUM CHLORIDE 0.9 % IJ SOLN
INTRAMUSCULAR | Status: AC
  Filled 2016-12-17: qty 10

## 2016-12-17 MED ORDER — EPINEPHRINE PF 1 MG/10ML IJ SOSY
PREFILLED_SYRINGE | INTRAMUSCULAR | Status: AC
  Filled 2016-12-17: qty 10

## 2016-12-17 MED ORDER — PROPOFOL 500 MG/50ML IV EMUL
INTRAVENOUS | Status: DC | PRN
  Administered 2016-12-17: 80 ug/kg/min via INTRAVENOUS
  Administered 2016-12-17: 60 ug/kg/min via INTRAVENOUS
  Administered 2016-12-17: 125 ug/kg/min via INTRAVENOUS

## 2016-12-17 MED ORDER — EPHEDRINE SULFATE 50 MG/ML IJ SOLN
INTRAMUSCULAR | Status: DC | PRN
  Administered 2016-12-17: 5 mg via INTRAVENOUS

## 2016-12-17 MED ORDER — SODIUM BICARBONATE 650 MG PO TABS
650.0000 mg | ORAL_TABLET | Freq: Three times a day (TID) | ORAL | Status: DC
  Administered 2016-12-17 – 2016-12-19 (×5): 650 mg via ORAL
  Filled 2016-12-17 (×5): qty 1

## 2016-12-17 MED ORDER — LIDOCAINE VISCOUS 2 % MT SOLN
OROMUCOSAL | Status: AC
  Filled 2016-12-17: qty 30

## 2016-12-17 MED ORDER — SIMETHICONE 40 MG/0.6ML PO SUSP
ORAL | Status: AC
  Filled 2016-12-17: qty 1.2

## 2016-12-17 MED ORDER — MIDAZOLAM HCL 2 MG/2ML IJ SOLN
1.0000 mg | INTRAMUSCULAR | Status: DC
  Administered 2016-12-17: 2 mg via INTRAVENOUS

## 2016-12-17 MED ORDER — FENTANYL CITRATE (PF) 100 MCG/2ML IJ SOLN
25.0000 ug | Freq: Once | INTRAMUSCULAR | Status: AC
  Administered 2016-12-17: 25 ug via INTRAVENOUS

## 2016-12-17 NOTE — Anesthesia Procedure Notes (Signed)
Procedure Name: MAC Date/Time: 12/17/2016 10:38 AM Performed by: Vista Deck Pre-anesthesia Checklist: Patient identified, Emergency Drugs available, Suction available, Timeout performed and Patient being monitored Patient Re-evaluated:Patient Re-evaluated prior to induction Oxygen Delivery Method: Non-rebreather mask

## 2016-12-17 NOTE — Anesthesia Postprocedure Evaluation (Signed)
Anesthesia Post Note  Patient: Aaron [name]  Procedure(s) Performed: Procedure(s) (LRB): COLONOSCOPY WITH PROPOFOL (N/A) ESOPHAGOGASTRODUODENOSCOPY (EGD) WITH PROPOFOL (N/A) ESOPHAGEAL DILATION BIOPSY  Patient location during evaluation: PACU Anesthesia Type: MAC Level of consciousness: awake and alert Pain management: satisfactory to patient Vital Signs Assessment: post-procedure vital signs reviewed and stable Respiratory status: spontaneous breathing Cardiovascular status: stable Postop Assessment: no signs of nausea or vomiting Anesthetic complications: no     Last Vitals:  Vitals:   12/17/16 1005 12/17/16 1150  BP: (!) 165/92 120/65  Pulse: 92   Resp: (!) 28 18  Temp: 36.6 C 36.8 C  SpO2: 98% 100%    Last Pain:  Vitals:   12/17/16 1040  TempSrc:   PainSc: 5                  Mele Sylvester

## 2016-12-17 NOTE — Progress Notes (Signed)
Patient briefly seen. He continues to have bleeding without stool present. Hgb has dropped from 13.8 on admission to 9.5 this morning. TCS/EGD as planned.   Laureen Ochs. Bernarda Caffey [facility] [phone] 9/5/20188:43 AM

## 2016-12-17 NOTE — Progress Notes (Signed)
Tap water enema started at 0630. Patient tolerated well. Patient had extra large BM of dark red liquid. No stool or solid pieces noted.

## 2016-12-17 NOTE — Op Note (Signed)
[facility] Patient Name: Aaron [name] Procedure Date: 12/17/2016 10:53 AM MRN: [id number] Date of Birth: 1956/01/24 Attending MD: Norvel Richards , MD CSN: [id number] Age: 61 Admit Type: Inpatient Procedure:                Colonoscopy Indications:              Hematochezia Providers:                Norvel Richards, MD, Jeanann Lewandowsky. Sharon Seller, RN,                            Aram Candela Referring MD:              Medicines:                Propofol per Anesthesia Complications:            No immediate complications. Estimated Blood Loss:     Estimated blood loss was minimal. Procedure:                Pre-Anesthesia Assessment:                           - Prior to the procedure, a History and Physical                            was performed, and patient medications and                            allergies were reviewed. The patient's tolerance of                            previous anesthesia was also reviewed. The risks                            and benefits of the procedure and the sedation                            options and risks were discussed with the patient.                            All questions were answered, and informed consent                            was obtained. Prior Anticoagulants: The patient has                            taken no previous anticoagulant or antiplatelet                            agents. ASA Grade Assessment: III - A patient with                            severe systemic disease. After reviewing the risks  and benefits, the patient was deemed in                            satisfactory condition to undergo the procedure.                           After obtaining informed consent, the colonoscope                            was passed under direct vision. Throughout the                            procedure, the patient's blood pressure, pulse, and                            oxygen saturations were monitored  continuously. The                            EC-[id number] ([id number]) scope was introduced through                            the and advanced to the 5 cm into the ileum. The                            terminal ileum, ileocecal valve, appendiceal                            orifice, and rectum were photographed. The quality                            of the bowel preparation was adequate. Scope In: 10:59:16 AM Scope Out: 11:41:53 AM Scope Withdrawal Time: 0 hours 35 minutes 30 seconds  Total Procedure Duration: 0 hours 42 minutes 37 seconds  Findings:      The perianal and digital rectal examinations were normal.      Discontinuous areas of nonbleeding ulcerated mucosa were present in the       colon at the ileocecal valve and cecum.. These were deep ulcers. No       bleeding stigmata. This was biopsied with a cold forceps for histology.       extensive left-sided diverticula. Diluted fresh blood and clot       throughout the the colon. The distal 5 cm of terminal ileum appeared       normal. I spent an hour copiously washing and lavaging the colonic       effluent throughout the colon to gain excellent visualization of the       colonic mucosa. The patient had densely populated left-sided       diverticula. However, no other mucosal abnormality was seen. No bleeding       lesion found. Once the colon was clean, No active bleeding site or       suspect lesion identified. Impression:               Dense left-sided diverticulosis.I suspect divecular  bleeding. Right colon ulcers otherwise appeared                            innocent and likely due to a separate process.                           - Mucosal ulceration. Biopsied. Moderate Sedation:      Moderate (conscious) sedation was personally administered by an       anesthesia professional. The following parameters were monitored: oxygen       saturation, heart rate, blood pressure, respiratory rate, EKG, adequacy        of pulmonary ventilation, and response to care. Total physician       intraservice time was 60 minutes. Recommendation:           - Return patient to hospital ward for ongoing care.                            Follow-up on pathology. See EGD report.                           - Clear liquid diet. See EGD report.                           - Continue present medications.                           - Repeat colonoscopy in 10 years for screening                            purposes. Procedure Code(s):        --- Professional ---                           [phone], Colonoscopy, flexible; with biopsy, single                            or multiple Diagnosis Code(s):        --- Professional ---                           K63.3, Ulcer of intestine                           K92.1, Melena (includes Hematochezia) CPT copyright 2016 [employer]. All rights reserved. The codes documented in this report are preliminary and upon coder review may  be revised to meet current compliance requirements. Cristopher Estimable. Kc Sedlak, MD Norvel Richards, MD 12/17/2016 12:04:29 PM This report has been signed electronically. Number of Addenda: 0

## 2016-12-17 NOTE — Progress Notes (Signed)
Aaron [name]  MRN: [id number]  DOB/AGE: 1955/05/12 61 y.o.  Primary Care Physician:Tapper, Zella Richer., MD  Admit date: 12/14/2016  Chief Complaint:  Chief Complaint  Patient presents with  . Abdominal Pain    S-Pt presented on  12/14/2016 with  Chief Complaint  Patient presents with  . Abdominal Pain  .    Pt today feels better  Meds . aspirin  81 mg Oral Daily  . bisacodyl  5 mg Oral QHS  . doxazosin  8 mg Oral Daily  . metoprolol succinate  25 mg Oral Daily  . nicotine  21 mg Transdermal Daily  . pantoprazole  40 mg Oral Daily  . senna  1 tablet Oral Daily  . sodium bicarbonate  650 mg Oral TID      Physical Exam: Vital signs in last 24 hours: Temp:  [97.9 F (36.6 C)-98.8 F (37.1 C)] 98.2 F (36.8 C) (09/05 1150) Pulse Rate:  [72-96] 92 (09/05 1005) Resp:  [16-28] 18 (09/05 1150) BP: (120-168)/(65-92) 120/65 (09/05 1150) SpO2:  [95 %-100 %] 100 % (09/05 1150) Weight change:  Last BM Date: 12/16/16  Intake/Output from previous day: 09/04 0701 - 09/05 0700 In: 4378 [P.O.:840; I.V.:3538] Out: 600 [Urine:600] Total I/O In: 1100 [I.V.:1100] Out: 0    Physical Exam: General- pt is awake,alert, oriented to time place and person Resp- No acute REsp distress, CTA B/L NO Rhonchi CVS- S1S2 regular in rate and rhythm GIT- BS+, soft, NT, ND EXT- NO LE Edema, Cyanosis   Lab Results: CBC  Recent Labs  12/16/16 2025 12/17/16 0548  WBC 9.0 7.8  HGB 10.4* 9.5*  HCT 30.6* 28.7*  PLT 195 196    BMET  Recent Labs  12/16/16 0201 12/17/16 0550  NA 133* 133*  134*  K 4.3 5.1  5.1  CL 110 109  110  CO2 16* 14*  16*  GLUCOSE 123* 87  92  BUN 43* 46*  46*  CREATININE 5.51* 5.64*  5.54*  CALCIUM 7.4* 7.2*  7.2*    Creat trend 2018 5.3--5.5 2016  1.6--1.8 2010  1.5   MICRO No results found for this or any previous visit (from the past 240 hour(s)).    Lab Results  Component Value Date   CALCIUM 7.2 (L) 12/17/2016   CALCIUM 7.2 (L)  12/17/2016   CAION 1.15 09/20/2014   PHOS 4.5 12/17/2016   Albumin  2.5 Corrected Calcium 7.2+ 1.2=8.4            Impression: 1)Renal  AKI secondary to Prerenal/ATN vs CKD progression               AKI on CKD               CKD stage 4 .               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   2)HTN  BP at goal  Medication-  On Beta blockers    3)Anemia HGb at goal (9--11) Admitted with GI bleeding GI and Primary MD following  4)CKD Mineral-Bone Disorder  Phosphorus at goal. Calcium near to  goal.  5)GI-admitted with GI bleeding GI  following Pt had scope   6)Electrolytes  Normokalemic  Hyponatremic stable  7)Acid base Co2 not  at goal     Plan:  Will start po bicarb NO acute need of HD      Sheniah Supak S 12/17/2016, 3:13 PM

## 2016-12-17 NOTE — Op Note (Signed)
[facility] Patient Name: Aaron [name] Procedure Date: 12/17/2016 10:01 AM MRN: [id number] Date of Birth: Jul 25, 1955 Attending MD: Norvel Richards , MD CSN: [id number] Age: 61 Admit Type: Inpatient Procedure:                Upper GI endoscopy Indications:              Dysphagia Providers:                Norvel Richards, MD, Jeanann Lewandowsky. Sharon Seller, RN,                            Aram Candela Referring MD:              Medicines:                Propofol per Anesthesia Complications:            No immediate complications. Estimated Blood Loss:     Estimated blood loss was minimal. Procedure:                Pre-Anesthesia Assessment:                           - Prior to the procedure, a History and Physical                            was performed, and patient medications and                            allergies were reviewed. The patient's tolerance of                            previous anesthesia was also reviewed. The risks                            and benefits of the procedure and the sedation                            options and risks were discussed with the patient.                            All questions were answered, and informed consent                            was obtained. Prior Anticoagulants: The patient has                            taken no previous anticoagulant or antiplatelet                            agents. ASA Grade Assessment: III - A patient with                            severe systemic disease. After reviewing the risks  and benefits, the patient was deemed in                            satisfactory condition to undergo the procedure.                           After obtaining informed consent, the endoscope was                            passed under direct vision. Throughout the                            procedure, the patient's blood pressure, pulse, and                            oxygen saturations were  monitored continuously. The                            EG-[id number] ([id number]) scope was introduced through the                            mouth, and advanced to the second part of duodenum.                            The upper GI endoscopy was accomplished without                            difficulty. The patient tolerated the procedure                            well. The upper GI endoscopy was accomplished                            without difficulty. The patient tolerated the                            procedure well. Scope In: 10:45:52 AM Scope Out: 10:53:26 AM Total Procedure Duration: 0 hours 7 minutes 34 seconds  Findings:      A mild Schatzki ring (acquired) was found at the gastroesophageal       junction. No tumor or esophagitis.      The exam was otherwise without abnormality.      A small hiatal hernia was present.      A few erosions were found in the stomach. This was biopsied with a cold       forceps for histology. Estimated blood loss was minimal.      The duodenal bulb and second portion of the duodenum were normal.       Estimated blood loss was minimal. The scope was withdrawn. Dilation was       performed with a Maloney dilator with mild resistance at 56 Fr. The       dilation site was examined following endoscope reinsertion and showed no       change. Estimated blood loss: none. The scope was withdrawn. Dilation       was performed with a  Maloney dilator with no resistance at 58 Fr. The       dilation site was examined following endoscope reinsertion and showed       mild improvement in luminal narrowing. Estimated blood loss was minimal. Impression:               - Mild Schatzki ring. Dilated.                           - The examination was otherwise normal.                           - Small hiatal hernia.                           - Erosive gastropathy. Biopsied.                           - Normal duodenal bulb and second portion of the                             duodenum. Moderate Sedation:      Moderate (conscious) sedation was personally administered by an       anesthesia professional. The following parameters were monitored: oxygen       saturation, heart rate, blood pressure, respiratory rate, EKG, adequacy       of pulmonary ventilation, and response to care. Total physician       intraservice time was 12 minutes. Recommendation:           - Return patient to hospital ward for observation.                           - Clear liquid diet.                           - Continue present medications.                           - Await pathology results. Daily PPI                           - Repeat upper endoscopy after studies are complete                            for surveillance based on pathology results.                           - Return to GI office (date not yet determined). Procedure Code(s):        --- Professional ---                           [phone], Esophagogastroduodenoscopy, flexible,                            transoral; with biopsy, single or multiple                           43450,  Dilation of esophagus, by unguided sound or                            bougie, single or multiple passes Diagnosis Code(s):        --- Professional ---                           K22.2, Esophageal obstruction                           K44.9, Diaphragmatic hernia without obstruction or                            gangrene                           K31.89, Other diseases of stomach and duodenum                           R13.10, Dysphagia, unspecified CPT copyright 2016 [employer]. All rights reserved. The codes documented in this report are preliminary and upon coder review may  be revised to meet current compliance requirements. Cristopher Estimable. Sahithi Ordoyne, MD Norvel Richards, MD 12/17/2016 11:53:10 AM This report has been signed electronically. Number of Addenda: 0

## 2016-12-17 NOTE — Anesthesia Preprocedure Evaluation (Signed)
Anesthesia Evaluation  Patient identified by MRN, date of birth, ID band Patient awake    Reviewed: Allergy & Precautions, NPO status , Patient's Chart, lab work & pertinent test results  Airway Mallampati: II  TM Distance: >3 FB     Dental  (+) Edentulous Upper, Edentulous Lower   Pulmonary COPD, former smoker,           Cardiovascular hypertension, + CAD and + Peripheral Vascular Disease  + dysrhythmias Atrial Fibrillation  Rhythm:Regular Rate:Normal     Neuro/Psych  Headaches, PSYCHIATRIC DISORDERS Anxiety Depression    GI/Hepatic GERD  ,(+) Hepatitis -, C  Endo/Other    Renal/GU ESRFRenal disease     Musculoskeletal   Abdominal   Peds  Hematology  (+) anemia ,   Anesthesia Other Findings   Reproductive/Obstetrics                             Anesthesia Physical Anesthesia Plan  ASA: III  Anesthesia Plan: MAC   Post-op Pain Management:    Induction: Intravenous  PONV Risk Score and Plan:   Airway Management Planned: Simple Face Mask  Additional Equipment:   Intra-op Plan:   Post-operative Plan:   Informed Consent: I have reviewed the patients History and Physical, chart, labs and discussed the procedure including the risks, benefits and alternatives for the proposed anesthesia with the patient or authorized representative who has indicated his/her understanding and acceptance.     Plan Discussed with:   Anesthesia Plan Comments:         Anesthesia Quick Evaluation

## 2016-12-17 NOTE — Transfer of Care (Signed)
Immediate Anesthesia Transfer of Care Note  Patient: [name]  Procedure(s) Performed: Procedure(s) with comments: COLONOSCOPY WITH PROPOFOL (N/A) ESOPHAGOGASTRODUODENOSCOPY (EGD) WITH PROPOFOL (N/A) ESOPHAGEAL DILATION BIOPSY - gastric colon  Patient Location: PACU  Anesthesia Type:MAC  Level of Consciousness: awake and patient cooperative  Airway & Oxygen Therapy: Patient Spontanous Breathing and Patient connected to nasal cannula oxygen  Post-op Assessment: Report given to RN and Post -op Vital signs reviewed and stable  Post vital signs: Reviewed and stable  Last Vitals:  Vitals:   12/17/16 0927 12/17/16 1005  BP: (!) 168/90 (!) 165/92  Pulse: 88 92  Resp:  (!) 28  Temp:  36.6 C  SpO2:  98%    Last Pain:  Vitals:   12/17/16 1040  TempSrc:   PainSc: 5       Patients Stated Pain Goal: 2 (01/74/94 4967)  Complications: No apparent anesthesia complications

## 2016-12-17 NOTE — Progress Notes (Signed)
PROGRESS NOTE  Aaron [name] LFY:[id number] DOB: [birth year] DOA: 12/14/2016 PCP: Deloria Lair., MD  Nephrologist: Pearson Grippe, MD  Brief Narrative: 61 year old man PMH polycystic kidney disease, chronic kidney disease stage IV presented with several day history of worsening bilateral flank pain, nausea, vomiting, decreased urine output. Admitted for acute kidney injury superimposed on chronic kidney disease. Seen by nephrology. Developed rectal bleeding, seen by GI.  Assessment/Plan Acute kidney injury superimposed on chronic kidney disease unknown stage.  Possibility acute on chronic versus natural progression, polycystic kidney disease Creatinine has been around 5.3> 5.5. Evaluated by nephrology. Continue IV fluids.    Polycystic kidney disease  Rectal bleeding. Differential diagnosis- Diverticulosis, ischemic colitis,  ,.   Hgb has dropped from 13.8 on admission to 9.5 this morning  . Per gastroenterology,  EGD and colonoscopy today, reveals likely diverticular bleeding  Nausea and vomiting with bilateral flank pain. -Nausea and vomiting resolved. Now just has predominantly left-sided flank pain. Further evaluation with colonoscopy 9/5. CT renal stone study on admission showed no acute findings to explain pain.  Essential hypertension. -Labile. Was high yesterday. Low this morning. Continue doxazosin, metoprolol, follow.  Cigarette smoker -Nicotine patch  COPD -Appears stable. Continue bronchodilators as needed.  Hepatitis C    DVT prophylaxis: heparin Code Status: full Family Communication: none Disposition Plan: home      7PM-7AM contact night coverage as above 12/17/2016, 10:19 AM  LOS: 1 day   Consultants:  Nephrology  Gastroenterology    Procedures: EGD impression:               - Mild Schatzki ring. Dilated.                           - The examination was otherwise normal.                           - Small hiatal hernia.                           -  Erosive gastropathy. Biopsied.                           - Normal duodenal bulb and second portion of the                             duodenum. Continue PPI   Colonoscopy           Dense left-sided diverticulosis.I suspect divecular                            bleeding. Right colon ulcers otherwise appeared                            innocent and likely due to a separate process.                           - Mucosal ulceration. Biopsied.  Antimicrobials:    Interval history/Subjective:   Patient continues with mild rectal bleeding  Objective: Vitals:  Today's Vitals   12/17/16 0610 12/17/16 0927 12/17/16 0931 12/17/16 1005  BP: (!) 157/74 (!) 168/90  (!) 165/92  Pulse: 88 88  92  Resp: 20   (!) 28  Temp: 98.2 F (36.8 C)   97.9 F (36.6 C)  TempSrc:    Oral  SpO2: 99%   98%  Weight:      Height:      PainSc:   6  6      Exam:     Constitutional. Appears calm, comfortable.  Respiratory. Clear to auscultation bilaterally. No wheezes, rales or rhonchi. Normal respiratory effort.  Cardiovascular. Regular rate and rhythm. No murmur, rub or gallop. No lower extremity edema.  Abdomen. Soft, nontender except for some mild left-sided tenderness.  Psychiatric. Grossly normal mood and affect. Speech fluent and appropriate.       Scheduled Meds: . [MAR Hold] aspirin  81 mg Oral Daily  . [MAR Hold] bisacodyl  5 mg Oral QHS  . [MAR Hold] doxazosin  8 mg Oral Daily  . [MAR Hold] metoprolol succinate  25 mg Oral Daily  . [MAR Hold] nicotine  21 mg Transdermal Daily  . [MAR Hold] pantoprazole  40 mg Oral Daily  . [MAR Hold] senna  1 tablet Oral Daily   Continuous Infusions: . sodium chloride 125 mL/hr at 12/17/16 4536    Principal Problem:   AKI (acute kidney injury) ([place]) Active Problems:   Essential hypertension   Tobacco abuse   COPD (chronic obstructive pulmonary disease) ([facility])   Polycystic kidney disease   CKD (chronic kidney disease)   Hepatitis C    Rectal bleeding   Acute renal failure (ARF) ([place])   LOS: 1 day

## 2016-12-17 NOTE — Progress Notes (Signed)
Pt had bright red bloody stool following dinner tonight.

## 2016-12-18 ENCOUNTER — Encounter: Payer: Self-pay | Admitting: Internal Medicine

## 2016-12-18 ENCOUNTER — Telehealth: Payer: Self-pay | Admitting: Gastroenterology

## 2016-12-18 ENCOUNTER — Encounter: Payer: Self-pay | Admitting: Gastroenterology

## 2016-12-18 DIAGNOSIS — N171 Acute kidney failure with acute cortical necrosis: Secondary | ICD-10-CM

## 2016-12-18 LAB — CBC
HEMATOCRIT: 27.3 % — AB (ref 39.0–52.0)
HEMATOCRIT: 28.2 % — AB (ref 39.0–52.0)
HEMOGLOBIN: 9.2 g/dL — AB (ref 13.0–17.0)
Hemoglobin: 9.5 g/dL — ABNORMAL LOW (ref 13.0–17.0)
MCH: 31.7 pg (ref 26.0–34.0)
MCH: 31.7 pg (ref 26.0–34.0)
MCHC: 33.7 g/dL (ref 30.0–36.0)
MCHC: 33.7 g/dL (ref 30.0–36.0)
MCV: 94.1 fL (ref 78.0–100.0)
MCV: 94 fL (ref 78.0–100.0)
PLATELETS: 199 10*3/uL (ref 150–400)
Platelets: 203 10*3/uL (ref 150–400)
RBC: 2.9 MIL/uL — AB (ref 4.22–5.81)
RBC: 3 MIL/uL — ABNORMAL LOW (ref 4.22–5.81)
RDW: 13.5 % (ref 11.5–15.5)
RDW: 13.5 % (ref 11.5–15.5)
WBC: 9.4 10*3/uL (ref 4.0–10.5)
WBC: 8.8 10*3/uL (ref 4.0–10.5)

## 2016-12-18 MED ORDER — NICOTINE 21 MG/24HR TD PT24: 21.0000 mg | MEDICATED_PATCH | Freq: Every day | TRANSDERMAL | 0 refills | Status: DC

## 2016-12-18 MED ORDER — SODIUM BICARBONATE 650 MG PO TABS: 650.0000 mg | ORAL_TABLET | Freq: Three times a day (TID) | ORAL | 1 refills | Status: DC

## 2016-12-18 MED ORDER — PANTOPRAZOLE SODIUM 40 MG PO TBEC: 40.0000 mg | DELAYED_RELEASE_TABLET | Freq: Every day | ORAL | 1 refills | Status: DC

## 2016-12-18 NOTE — Telephone Encounter (Addendum)
Patient needs hospital follow up in 6 weeks regarding GI bleeding, ?hep c, consider mesenteric ischemia work up with doppler

## 2016-12-18 NOTE — Anesthesia Postprocedure Evaluation (Signed)
Anesthesia Post Note  Patient: Aaron [name]  Procedure(s) Performed: Procedure(s) (LRB): COLONOSCOPY WITH PROPOFOL (N/A) ESOPHAGOGASTRODUODENOSCOPY (EGD) WITH PROPOFOL (N/A) ESOPHAGEAL DILATION BIOPSY  Patient location during evaluation: Nursing Unit Anesthesia Type: MAC Level of consciousness: awake Pain management: pain level controlled Vital Signs Assessment: post-procedure vital signs reviewed and stable Respiratory status: spontaneous breathing, nonlabored ventilation and respiratory function stable Cardiovascular status: blood pressure returned to baseline Postop Assessment: no signs of nausea or vomiting Anesthetic complications: no     Last Vitals:  Vitals:   12/17/16 2152 12/18/16 0524  BP: (!) 170/69 (!) 161/85  Pulse: 80 85  Resp: 20 18  Temp: 36.9 C 37.2 C  SpO2: 95% 99%    Last Pain:  Vitals:   12/18/16 0545  TempSrc:   PainSc: 6                  Jesi Jurgens J

## 2016-12-18 NOTE — Discharge Summary (Addendum)
Physician Discharge Summary  Aaron [name] MRN: [id number] DOB/AGE: 1956/04/14 61 y.o.  PCP: Deloria Lair., MD   Admit date: 12/14/2016 Discharge date: 12/18/2016  Discharge Diagnoses:    Principal Problem:   AKI (acute kidney injury) ([place]) Active Problems:   Essential hypertension   Tobacco abuse   COPD (chronic obstructive pulmonary disease) ([facility])   Polycystic kidney disease   CKD (chronic kidney disease)   Hepatitis C   Rectal bleeding   Acute renal failure (ARF) ([facility])   Pain of upper abdomen  Addendum  Dc cancelled for today , as patient needs to tolerate solids  Patient will dc in am    Follow-up recommendations Follow-up with PCP in 3-5 days , including all  additional recommended appointments as below Follow-up CBC, CMP in 3-5 days Patient follow-up in nephrology, Study [facility]  He will need follow up  With Dr Gala Romney in 6 weeks with GI.   Recheck  HCV RNA again in few months for reassurance   consider checking for mesenteric ischemia   Unable to receive contrast for CTA, would probably need  mesenteric doppler study.      Current Discharge Medication List    START taking these medications   Details  nicotine (NICODERM CQ - DOSED IN MG/24 HOURS) 21 mg/24hr patch Place 1 patch (21 mg total) onto the skin daily. Qty: 28 patch, Refills: 0    pantoprazole (PROTONIX) 40 MG tablet Take 1 tablet (40 mg total) by mouth daily. Qty: 30 tablet, Refills: 1    sodium bicarbonate 650 MG tablet Take 1 tablet (650 mg total) by mouth 3 (three) times daily. Qty: 120 tablet, Refills: 1      CONTINUE these medications which have NOT CHANGED   Details  albuterol (PROVENTIL HFA) 108 (90 BASE) MCG/ACT inhaler Inhale 2 puffs into the lungs every 6 (six) hours as needed for wheezing or shortness of breath.     aspirin 81 MG tablet Take 81 mg by mouth daily.      doxazosin (CARDURA) 8 MG tablet Take 8 mg by mouth daily.    HYDROcodone-acetaminophen  (NORCO/VICODIN) 5-325 MG tablet Take 1 tablet by mouth every 4 (four) hours as needed. Qty: 15 tablet, Refills: 0    oxyCODONE-acetaminophen (PERCOCET) 10-325 MG per tablet Take 1 tablet by mouth every 3 (three) hours.     torsemide (DEMADEX) 20 MG tablet Take 20 mg by mouth daily.      STOP taking these medications     omeprazole (PRILOSEC) 20 MG capsule         *  Discharge Condition: Stable  Discharge Instructions Get Medicines reviewed and adjusted: Please take all your medications with you for your next visit with your Primary MD  Please request your Primary MD to go over all hospital tests and procedure/radiological results at the follow up, please ask your Primary MD to get all Hospital records sent to his/her office.  If you experience worsening of your admission symptoms, develop shortness of breath, life threatening emergency, suicidal or homicidal thoughts you must seek medical attention immediately by calling 911 or calling your MD immediately if symptoms less severe.  You must read complete instructions/literature along with all the possible adverse reactions/side effects for all the Medicines you take and that have been prescribed to you. Take any new Medicines after you have completely understood and accpet all the possible adverse reactions/side effects.   Do not drive when taking Pain medications.   Do not take  more than prescribed Pain, Sleep and Anxiety Medications  Special Instructions: If you have smoked or chewed Tobacco in the last 2 yrs please stop smoking, stop any regular Alcohol and or any Recreational drug use.  Wear Seat belts while driving.  Please note  You were cared for by a hospitalist during your hospital stay. Once you are discharged, your primary care physician will handle any further medical issues. Please note that NO REFILLS for any discharge medications will be authorized once you are discharged, as it is imperative that you return to  your primary care physician (or establish a relationship with a primary care physician if you do not have one) for your aftercare needs so that they can reassess your need for medications and monitor your lab values.     No Known Allergies    Disposition: 01-Home or Self Care   Consults:  Nephrology GI     Significant Diagnostic Studies:  Ct Renal Stone Study  Result Date: 12/14/2016 CLINICAL DATA:  Bilateral lower abdominal pain, nausea and vomiting with decreased urinary output x3 days. EXAM: CT ABDOMEN AND PELVIS WITHOUT CONTRAST TECHNIQUE: Multidetector CT imaging of the abdomen and pelvis was performed following the standard protocol without IV contrast. COMPARISON:  09/23/2014 CT FINDINGS: Lower chest: Normal size heart. Dependent atelectasis at each lung base right greater than left. No effusion. Hepatobiliary: Multiple small cystic lesions are again noted without dominant mass. No biliary dilatation. The gallbladder is nondistended and free of stones. Pancreas: No ductal dilatation or mass. Spleen: Normal Adrenals/Urinary Tract: Normal bilateral adrenal glands. Bilateral nephromegaly secondary to innumerable cysts of varying density consistent with polycystic renal disease. No obstructive uropathy. There are nonobstructing left-sided renal calculi. No hydroureteronephrosis. The urinary bladder is unremarkable. Stomach/Bowel: There is descending and sigmoid diverticulosis without acute diverticulitis. No bowel obstruction. Normal small bowel rotation. Nondistended stomach. Normal appendix. Vascular/Lymphatic: Aortoiliac and branch vessel atherosclerosis without aneurysm. No lymphadenopathy. Reproductive: Prostate is unremarkable. Other: Fat containing inguinal hernias bilaterally left lateral fat containing hernia is also seen in the upper abdomen. Musculoskeletal: Interbody blocks noted at the L2-3 and L3-4 interspace with marked degenerative disc disease and vacuum disc phenomenon  L5-S1. Retrolisthesis grade 1 is seen of L5 on S1. Some interval disc space narrowing is also noted at L4-5 since prior 2016 exam. IMPRESSION: 1. Bilateral nephromegaly with innumerable cysts of varying density some which are simple and others hyperdense possibly representing hemorrhagic or proteinaceous cysts are identified in conjunction with cystic stable lesions of the liver and abdominal hernias. 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 2. Left-sided nephrolithiasis.  No findings of obstructive uropathy. 3. Left-sided colonic diverticulosis without acute diverticulitis. 4. Lumbar spondylosis with slight interval disc space narrowing at L4-5. Interbody blocks are again seen at L2-3 and L3-4 with slight grade 1 retrolisthesis of L5 on S1. Electronically Signed   By: Ashley Royalty M.D.   On: 12/14/2016 17:57        Filed Weights   12/14/16 2001 12/15/16 0606 12/18/16 0700  Weight: 87.8 kg (193 lb 9 oz) 88.3 kg (194 lb 10.7 oz) 88.3 kg (194 lb 10.7 oz)     Microbiology: No results found for this or any previous visit (from the past 240 hour(s)).     Blood Culture    Component Value Date/Time   SDES BLOOD RIGHT HAND 09/23/2014 0504   SPECREQUEST BOTTLES DRAWN AEROBIC ONLY 5CC 09/23/2014 0504   CULT  09/23/2014 0504    NO GROWTH  5 DAYS Performed at [facility] 09/29/2014 FINAL 09/23/2014 0504      Labs: Results for orders placed or performed during the hospital encounter of 12/14/16 (from the past 48 hour(s))  CBC     Status: Abnormal   Collection Time: 12/16/16 11:53 AM  Result Value Ref Range   WBC 7.7 4.0 - 10.5 K/uL   RBC 3.39 (L) 4.22 - 5.81 MIL/uL   Hemoglobin 10.7 (L) 13.0 - 17.0 g/dL   HCT 31.8 (L) 39.0 - 52.0 %   MCV 93.8 78.0 - 100.0 fL   MCH 31.6 26.0 - 34.0 pg   MCHC 33.6 30.0 - 36.0 g/dL   RDW 13.5 11.5 - 15.5 %   Platelets 198 150 - 400 K/uL  CBC     Status: Abnormal   Collection Time: 12/16/16  3:31  PM  Result Value Ref Range   WBC 8.0 4.0 - 10.5 K/uL   RBC 3.36 (L) 4.22 - 5.81 MIL/uL   Hemoglobin 10.6 (L) 13.0 - 17.0 g/dL   HCT 31.7 (L) 39.0 - 52.0 %   MCV 94.3 78.0 - 100.0 fL   MCH 31.5 26.0 - 34.0 pg   MCHC 33.4 30.0 - 36.0 g/dL   RDW 13.6 11.5 - 15.5 %   Platelets 199 150 - 400 K/uL  HCV RNA quant rflx ultra or genotyp     Status: None   Collection Time: 12/16/16  3:31 PM  Result Value Ref Range   HCV RNA Qnt(log copy/mL) UNABLE TO CALCULATE log10 IU/mL    Comment: (NOTE) Unable to calculate result since non-numeric result obtained for component test.    HepC Qn HCV Not Detected IU/mL   Test Information Comment     Comment: (NOTE) The quantitative range of this assay is 15 IU/mL to 100 million IU/mL. Performed At: [facility] [place], [place] [id number] Lindon Romp MD [id number]    Hcv Genotype RTNI     Comment: Not indicated  CBC     Status: Abnormal   Collection Time: 12/16/16  8:25 PM  Result Value Ref Range   WBC 9.0 4.0 - 10.5 K/uL   RBC 3.25 (L) 4.22 - 5.81 MIL/uL   Hemoglobin 10.4 (L) 13.0 - 17.0 g/dL   HCT 30.6 (L) 39.0 - 52.0 %   MCV 94.2 78.0 - 100.0 fL   MCH 32.0 26.0 - 34.0 pg   MCHC 34.0 30.0 - 36.0 g/dL   RDW 13.6 11.5 - 15.5 %   Platelets 195 150 - 400 K/uL  CBC     Status: Abnormal   Collection Time: 12/17/16  5:48 AM  Result Value Ref Range   WBC 7.8 4.0 - 10.5 K/uL   RBC 3.04 (L) 4.22 - 5.81 MIL/uL   Hemoglobin 9.5 (L) 13.0 - 17.0 g/dL   HCT 28.7 (L) 39.0 - 52.0 %   MCV 94.4 78.0 - 100.0 fL   MCH 31.3 26.0 - 34.0 pg   MCHC 33.1 30.0 - 36.0 g/dL   RDW 13.8 11.5 - 15.5 %   Platelets 196 150 - 400 K/uL  Renal function panel     Status: Abnormal   Collection Time: 12/17/16  5:50 AM  Result Value Ref Range   Sodium 134 (L) 135 - 145 mmol/L   Potassium 5.1 3.5 - 5.1 mmol/L   Chloride 110 101 - 111 mmol/L   CO2 16 (L) 22 - 32 mmol/L   Glucose, Bld 92  65 - 99 mg/dL   BUN 46 (H) 6 - 20 mg/dL   Creatinine,  Ser 5.54 (H) 0.61 - 1.24 mg/dL   Calcium 7.2 (L) 8.9 - 10.3 mg/dL   Phosphorus 4.5 2.5 - 4.6 mg/dL   Albumin 2.5 (L) 3.5 - 5.0 g/dL   GFR calc non Af Amer 10 (L) >60 mL/min   GFR calc Af Amer 12 (L) >60 mL/min    Comment: (NOTE) The eGFR has been calculated using the CKD EPI equation. This calculation has not been validated in all clinical situations. eGFR's persistently <60 mL/min signify possible Chronic Kidney Disease.    Anion gap 8 5 - 15  Comprehensive metabolic panel     Status: Abnormal   Collection Time: 12/17/16  5:50 AM  Result Value Ref Range   Sodium 133 (L) 135 - 145 mmol/L   Potassium 5.1 3.5 - 5.1 mmol/L   Chloride 109 101 - 111 mmol/L   CO2 14 (L) 22 - 32 mmol/L   Glucose, Bld 87 65 - 99 mg/dL   BUN 46 (H) 6 - 20 mg/dL   Creatinine, Ser 5.64 (H) 0.61 - 1.24 mg/dL   Calcium 7.2 (L) 8.9 - 10.3 mg/dL   Total Protein 5.2 (L) 6.5 - 8.1 g/dL   Albumin 2.6 (L) 3.5 - 5.0 g/dL   AST 8 (L) 15 - 41 U/L   ALT 6 (L) 17 - 63 U/L   Alkaline Phosphatase 64 38 - 126 U/L   Total Bilirubin 0.2 (L) 0.3 - 1.2 mg/dL   GFR calc non Af Amer 10 (L) >60 mL/min   GFR calc Af Amer 11 (L) >60 mL/min    Comment: (NOTE) The eGFR has been calculated using the CKD EPI equation. This calculation has not been validated in all clinical situations. eGFR's persistently <60 mL/min signify possible Chronic Kidney Disease.    Anion gap 10 5 - 15  CBC     Status: Abnormal   Collection Time: 12/18/16  5:49 AM  Result Value Ref Range   WBC 9.4 4.0 - 10.5 K/uL   RBC 2.90 (L) 4.22 - 5.81 MIL/uL   Hemoglobin 9.2 (L) 13.0 - 17.0 g/dL   HCT 27.3 (L) 39.0 - 52.0 %   MCV 94.1 78.0 - 100.0 fL   MCH 31.7 26.0 - 34.0 pg   MCHC 33.7 30.0 - 36.0 g/dL   RDW 13.5 11.5 - 15.5 %   Platelets 203 150 - 400 K/uL     Lipid Panel     Component Value Date/Time   CHOL 186 09/23/2014 0500   TRIG 74 09/23/2014 0500   HDL 63 09/23/2014 0500   CHOLHDL 3.0 09/23/2014 0500   VLDL 15 09/23/2014 0500   LDLCALC  108 (H) 09/23/2014 0500     No results found for: HGBA1C   Lab Results  Component Value Date   LDLCALC 108 (H) 09/23/2014   CREATININE 5.54 (H) 12/17/2016   CREATININE 5.64 (H) 12/17/2016     HPI :  Aaron [name] is a 61 y.o. year old male with history of polycystic kidney disease, chronic kidney disease, presenting to ED on 9/2 with worsening flank pain, nausea, vomiting, and decreased urine output. Found to have acute on chronic kidney injury, possible gastroenteritis, and CT completed on admission as noted below.   He started having rectal bleeding overnight, approximately 5-6 episodes. Last episode around 9am this morning. Noted LLQ discomfort/cramping that was relieved with BMs. Denies NSAIDs or aspirin powders. Takes  an 81 mg aspirin daily. Unintentional weight loss noted over the past 6 months per patient, stating he used to weigh 245 and now weighs in the 190s. Notes reflux at night, no dysphagia. He was hoping he could go home, then the bleeding overnight started. CBC yesterday with Hgb 13.3, with drop to  9.5 .  Last colonoscopy in 2008 with  rare sigmoid colon diverticulosis, internal hemorrhoids,. EGD also at that time with normal esophagus without evidence of Barrett's, antritis and duodenitis. Path with H.pylori gastritis   HOSPITAL COURSE:   Acute kidney injury superimposed on chronic kidney disease stage IV  Possibility acute on chronic versus natural progression, polycystic kidney disease Creatinine has been around 5.3> 5.5. Evaluated by nephrology.  Creatinine has not changed despite IV hydration This is likely his new baseline, will need close outpatient follow-up with nephrology Started on sodium bicarbonate tablets  Polycystic kidney disease   Rectal bleeding. Differential diagnosis- Diverticulosis, ischemic colitis,  ,.   Hgb   dropped from 13.8 on admission to 9.5   . Per gastroenterology,  EGD and colonoscopy revealed mild Schatzki's ring, small hiatal  hernia, erosive gastropathy, colonoscopy suggestive of left-sided diverticulosis and diverticular bleeding  Hemoglobin remained stable at around 9.2 Patient evaluated by GI prior to discharge Recommended to stay on a PPI long-term follow-up with GI in the outpatient setting for surveillance EGD , repeat colonoscopy in 10 years for screening purposes  Biopsy of stomach reveals non-H. pylori gastritis. Biopsy of colonic ulcers demonstrated acute ulceration; CMV stains negative. Suspected NSAID use versus transient ischemia. Findings not consistent with inflammatory bowel disease. Again, left-sided diverticulosis most likely source of bleeding .    Nausea and vomiting with bilateral flank pain. -Nausea and vomiting resolved. Now just has predominantly left-sided flank pain.  CT renal stone study on admission showed no acute findings to explain pain.  Essential hypertension.  Continue doxazosin, metoprolol, follow.  Cigarette smoker -Nicotine patch  COPD -Appears stable. Continue bronchodilators as needed.  Hepatitis C  Discharge Exam:   Blood pressure (!) 161/85, pulse 85, temperature 98.9 F (37.2 C), temperature source Oral, resp. rate 18, height '6\' 4"'  (1.93 m), weight 88.3 kg (194 lb 10.7 oz), SpO2 99 %. General:   Alert,  Well-developed, well-nourished, pleasant and cooperative in NAD Head:  Normocephalic and atraumatic. Eyes:  Sclera clear, no icterus.   Conjunctiva pink. Ears:  Normal auditory acuity. Nose:  No deformity, discharge,  or lesions. Mouth:  No deformity or lesions, dentition normal. Lungs:  Clear throughout to auscultation.   Heart:  S1 S2 present without murmurs  Abdomen:  +BS, round, distended but soft, possible ventral hernia vs significant rectus diastasis, normal bowel sounds. Mild to moderate TTP LLQ     Follow-up Information    Deloria Lair., MD. Call.   Specialty:  Family Medicine Why:  Hospital follow-up in 3-5 days, check CBC, BMP Contact  information: [address] [place] [address] [phone]        Fran Lowes, MD. Call.   Specialty:  Nephrology Why:  Hospital follow-up in 3-5 days Contact information: [address] [address] [phone]           Signed: Reyne Dumas 12/18/2016, 9:05 AM        Time spent >1 hour

## 2016-12-18 NOTE — Care Management Important Message (Signed)
Important Message  Patient Details  Name: GORAN OLDEN MRN: [id number] Date of Birth: 10-May-1955   Medicare Important Message Given:  Yes    Mike Berntsen, Chauncey Reading, RN 12/18/2016, 10:00 AM

## 2016-12-18 NOTE — Telephone Encounter (Signed)
APPT MADE AND LETTER SENT  °

## 2016-12-18 NOTE — Addendum Note (Signed)
Addendum  created 12/18/16 1043 by Charmaine Downs, CRNA   Sign clinical note

## 2016-12-18 NOTE — Progress Notes (Signed)
Subjective:  Passed small amount of blood twice since procedures. Abdominal pain improved.   Objective: Vital signs in last 24 hours: Temp:  [97.7 F (36.5 C)-98.9 F (37.2 C)] 98.9 F (37.2 C) (09/06 0524) Pulse Rate:  [80-92] 85 (09/06 0524) Resp:  [18-28] 18 (09/06 0524) BP: (120-175)/(65-94) 161/85 (09/06 0524) SpO2:  [95 %-100 %] 99 % (09/06 0524) Weight:  [194 lb 10.7 oz (88.3 kg)] 194 lb 10.7 oz (88.3 kg) (09/06 0700) Last BM Date: 12/16/16 General:   Alert,  Well-developed, well-nourished, pleasant and cooperative in NAD Head:  Normocephalic and atraumatic. Eyes:  Sclera clear, no icterus.  Abdomen:  Soft, mild llq tenderness and nondistended.   Normal bowel sounds, without guarding, and without rebound.   Extremities:  Without clubbing, deformity or edema. Neurologic:  Alert and  oriented x4;  grossly normal neurologically. Skin:  Intact without significant lesions or rashes. Psych:  Alert and cooperative. Normal mood and affect.  Intake/Output from previous day: 09/05 0701 - 09/06 0700 In: 2980 [P.O.:1080; I.V.:1900] Out: 0  Intake/Output this shift: No intake/output data recorded.  Lab Results: CBC  Recent Labs  12/16/16 2025 12/17/16 0548 12/18/16 0549  WBC 9.0 7.8 9.4  HGB 10.4* 9.5* 9.2*  HCT 30.6* 28.7* 27.3*  MCV 94.2 94.4 94.1  PLT 195 196 203   BMET  Recent Labs  12/16/16 0201 12/17/16 0550  NA 133* 133*  134*  K 4.3 5.1  5.1  CL 110 109  110  CO2 16* 14*  16*  GLUCOSE 123* 87  92  BUN 43* 46*  46*  CREATININE 5.51* 5.64*  5.54*  CALCIUM 7.4* 7.2*  7.2*   LFTs  Recent Labs  12/16/16 0201 12/17/16 0550  BILITOT  --  0.2*  ALKPHOS  --  64  AST  --  8*  ALT  --  6*  PROT  --  5.2*  ALBUMIN 2.7* 2.6*  2.5*   HCV RN not detected.   No results for input(s): LIPASE in the last 72 hours. PT/INR No results for input(s): LABPROT, INR in the last 72 hours.    Imaging Studies: Ct Renal Stone Study  Result Date:  12/14/2016 CLINICAL DATA:  Bilateral lower abdominal pain, nausea and vomiting with decreased urinary output x3 days. EXAM: CT ABDOMEN AND PELVIS WITHOUT CONTRAST TECHNIQUE: Multidetector CT imaging of the abdomen and pelvis was performed following the standard protocol without IV contrast. COMPARISON:  09/23/2014 CT FINDINGS: Lower chest: Normal size heart. Dependent atelectasis at each lung base right greater than left. No effusion. Hepatobiliary: Multiple small cystic lesions are again noted without dominant mass. No biliary dilatation. The gallbladder is nondistended and free of stones. Pancreas: No ductal dilatation or mass. Spleen: Normal Adrenals/Urinary Tract: Normal bilateral adrenal glands. Bilateral nephromegaly secondary to innumerable cysts of varying density consistent with polycystic renal disease. No obstructive uropathy. There are nonobstructing left-sided renal calculi. No hydroureteronephrosis. The urinary bladder is unremarkable. Stomach/Bowel: There is descending and sigmoid diverticulosis without acute diverticulitis. No bowel obstruction. Normal small bowel rotation. Nondistended stomach. Normal appendix. Vascular/Lymphatic: Aortoiliac and branch vessel atherosclerosis without aneurysm. No lymphadenopathy. Reproductive: Prostate is unremarkable. Other: Fat containing inguinal hernias bilaterally left lateral fat containing hernia is also seen in the upper abdomen. Musculoskeletal: Interbody blocks noted at the L2-3 and L3-4 interspace with marked degenerative disc disease and vacuum disc phenomenon L5-S1. Retrolisthesis grade 1 is seen of L5 on S1. Some interval disc space narrowing is also noted at L4-5 since prior 2016  exam. IMPRESSION: 1. Bilateral nephromegaly with innumerable cysts of varying density some which are simple and others hyperdense possibly representing hemorrhagic or proteinaceous cysts are identified in conjunction with cystic stable lesions of the liver and abdominal  hernias. 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 2. Left-sided nephrolithiasis.  No findings of obstructive uropathy. 3. Left-sided colonic diverticulosis without acute diverticulitis. 4. Lumbar spondylosis with slight interval disc space narrowing at L4-5. Interbody blocks are again seen at L2-3 and L3-4 with slight grade 1 retrolisthesis of L5 on S1. Electronically Signed   By: Ashley Royalty M.D.   On: 12/14/2016 17:57  [2 weeks]   Assessment: 61 year old male admitted with acute on chronic kidney injury with new onset rectal bleeding with resulting 3 gram drop in Hgb. LLQ abd pain. CT without diverticulitis. Left-sided nephrolithiasis but no obstructive uropathy.  Colonoscopy: dense left-sided diverticulosis. ?diverticular bleed. Right colon ulcers innocent appearing and likely separate process, biopsied.  ?etiology, ?ischmia or nsaid.  EGD: mild schatzki ring s/p dilation, smal hh, erosive gastropathy s/p bx.   Hepatitis C: per patient, he was just informed of this diagnosis. I do not see Hep C antibody on file. Patient states his nephrologist told him his HCV test was positive. HCV RNA NEGATIVE THIS ADMISSION. Likely cleared Hep C on his on OR was false positive HCV Ab.   Plan: 1. Advance diet.  2. F/u pending path. 3. He will need follow up in our office in 6 weeks. Would consider rechecking HCV RNA again in few months for reassurance as remote chance that he has a viral count below detectable level, but given the sensitivity of HCV RNA testing performed this is unlikely. 4. Would consider checking for mesenteric ischemia in near future. Await path findings. Unable to receive contrast for CTA, would consider mesenteric doppler study.   Laureen Ochs. Bernarda Caffey [facility] [phone] 9/6/20189:29 AM      LOS: 2 days

## 2016-12-18 NOTE — Progress Notes (Signed)
Subjective: Interval History: has no complaint of nausea or vomiting. His appetite is good. Patient denies also any difficulty breathing..  Objective: Vital signs in last 24 hours: Temp:  [97.7 F (36.5 C)-98.9 F (37.2 C)] 98.9 F (37.2 C) (09/06 0524) Pulse Rate:  [80-92] 85 (09/06 0524) Resp:  [18-28] 18 (09/06 0524) BP: (120-175)/(65-94) 161/85 (09/06 0524) SpO2:  [95 %-100 %] 99 % (09/06 0524) Weight:  [88.3 kg (194 lb 10.7 oz)] 88.3 kg (194 lb 10.7 oz) (09/06 0700) Weight change:   Intake/Output from previous day: 09/05 0701 - 09/06 0700 In: 3400 [P.O.:1200; I.V.:2200] Out: 0  Intake/Output this shift: No intake/output data recorded.  General appearance: alert, cooperative and no distress Resp: clear to auscultation bilaterally Cardio: regular rate and rhythm Extremities: No edema  Lab Results:  Recent Labs  12/17/16 0548 12/18/16 0549  WBC 7.8 9.4  HGB 9.5* 9.2*  HCT 28.7* 27.3*  PLT 196 203   BMET:  Recent Labs  12/16/16 0201 12/17/16 0550  NA 133* 133*  134*  K 4.3 5.1  5.1  CL 110 109  110  CO2 16* 14*  16*  GLUCOSE 123* 87  92  BUN 43* 46*  46*  CREATININE 5.51* 5.64*  5.54*  CALCIUM 7.4* 7.2*  7.2*   No results for input(s): PTH in the last 72 hours. Iron Studies: No results for input(s): IRON, TIBC, TRANSFERRIN, FERRITIN in the last 72 hours.  Studies/Results: No results found.  I have reviewed the patient's current medications.  Assessment/Plan:  Problem #1 renal failure: Chronic. Late stage IV and early stage V. Presently patient remains asymptomatic. He does have any nausea or vomiting. His appetite remains good. Problem #2 anemia: His hemoglobin is below our target goal but remains stable. Problem #3 bone and mineral disorder: His calcium and phosphorus is range Problem #4 hypertension: His blood pressure is reasonably controlled. Problem #5 low CO2 possibly secondary to metabolic acidosis. Patient presently on sodium  bicarbonate. His CO2 is low but stable Problem #6. Management: Presently he doesn't have any sign of fluid overload. Patient had 3600 mL of urine output. Problem #7 history of COPD Plan: 1] Patient presently does need dialysis. 2] I advised him to call his nephrologist once he is discharged to make an early appointment for follow-up. 3] we'll check his renal panel in the morning.  4] we'll decrease IV fluids to 50 mL per hour   LOS: 2 days   Paulyne Mooty S 12/18/2016,9:50 AM

## 2016-12-19 ENCOUNTER — Encounter (HOSPITAL_COMMUNITY): Payer: Self-pay | Admitting: Internal Medicine

## 2016-12-19 DIAGNOSIS — J418 Mixed simple and mucopurulent chronic bronchitis: Secondary | ICD-10-CM

## 2016-12-19 DIAGNOSIS — G43A1 Cyclical vomiting, intractable: Secondary | ICD-10-CM

## 2016-12-19 LAB — CBC
HCT: 25.4 % — ABNORMAL LOW (ref 39.0–52.0)
HEMOGLOBIN: 8.6 g/dL — AB (ref 13.0–17.0)
MCH: 31.6 pg (ref 26.0–34.0)
MCHC: 33.9 g/dL (ref 30.0–36.0)
MCV: 93.4 fL (ref 78.0–100.0)
Platelets: 208 10*3/uL (ref 150–400)
RBC: 2.72 MIL/uL — AB (ref 4.22–5.81)
RDW: 13.6 % (ref 11.5–15.5)
WBC: 7.4 10*3/uL (ref 4.0–10.5)

## 2016-12-19 LAB — RENAL FUNCTION PANEL
Albumin: 2.3 g/dL — ABNORMAL LOW (ref 3.5–5.0)
Anion gap: 6 (ref 5–15)
BUN: 45 mg/dL — ABNORMAL HIGH (ref 6–20)
CO2: 19 mmol/L — AB (ref 22–32)
Calcium: 7.4 mg/dL — ABNORMAL LOW (ref 8.9–10.3)
Chloride: 112 mmol/L — ABNORMAL HIGH (ref 101–111)
Creatinine, Ser: 4.84 mg/dL — ABNORMAL HIGH (ref 0.61–1.24)
GFR calc non Af Amer: 12 mL/min — ABNORMAL LOW (ref >60)
GFR, EST AFRICAN AMERICAN: 14 mL/min — AB (ref >60)
GLUCOSE: 101 mg/dL — AB (ref 65–99)
Phosphorus: 4.7 mg/dL — ABNORMAL HIGH (ref 2.5–4.6)
Potassium: 4.8 mmol/L (ref 3.5–5.1)
SODIUM: 137 mmol/L (ref 135–145)

## 2016-12-19 MED ORDER — TORSEMIDE 20 MG PO TABS: 20.0000 mg | ORAL_TABLET | Freq: Every day | ORAL | 0 refills | Status: DC

## 2016-12-19 NOTE — Progress Notes (Signed)
Spoke with GI team, Lanny Hurst, NP. Stated pt was stable to be D/C. Removed IV per order. Educated pt on D/C orders/medications. Pt. Verbalized understanding. Pt family at bedside to transport home.

## 2016-12-19 NOTE — Progress Notes (Signed)
Aaron [name]  MRN: [id number]  DOB/AGE: [birth year] 61 y.o.  Primary Care Physician:Tapper, Zella Richer., MD  Admit date: 12/14/2016  Chief Complaint:  Chief Complaint  Patient presents with  . Abdominal Pain    S-Pt presented on  12/14/2016 with  Chief Complaint  Patient presents with  . Abdominal Pain  .    Pt today feels better  Meds . aspirin  81 mg Oral Daily  . bisacodyl  5 mg Oral QHS  . doxazosin  8 mg Oral Daily  . metoprolol succinate  25 mg Oral Daily  . nicotine  21 mg Transdermal Daily  . pantoprazole  40 mg Oral Daily  . senna  1 tablet Oral Daily  . sodium bicarbonate  650 mg Oral TID      Physical Exam: Vital signs in last 24 hours: Temp:  [98.4 F (36.9 C)-99.5 F (37.5 C)] 98.4 F (36.9 C) (09/07 0524) Pulse Rate:  [83-92] 83 (09/07 0524) Resp:  [19-20] 20 (09/07 0524) BP: (159-184)/(79-84) 159/84 (09/07 0524) SpO2:  [95 %-99 %] 95 % (09/07 0737) Weight:  [202 lb 8 oz (91.9 kg)] 202 lb 8 oz (91.9 kg) (09/07 0524) Weight change: 7 lb 13.3 oz (3.553 kg) Last BM Date: 12/18/16  Intake/Output from previous day: 09/06 0701 - 09/07 0700 In: 3146.3 [P.O.:1870; I.V.:1276.3] Out: -  Total I/O In: 240 [P.O.:240] Out: -    Physical Exam: General- pt is awake,alert, oriented to time place and person Resp- No acute REsp distress, CTA B/L NO Rhonchi CVS- S1S2 regular in rate and rhythm GIT- BS+, soft, NT, ND EXT- NO LE Edema, Cyanosis   Lab Results: CBC  Recent Labs  12/18/16 1217 12/19/16 0600  WBC 8.8 7.4  HGB 9.5* 8.6*  HCT 28.2* 25.4*  PLT 199 208    BMET  Recent Labs  12/17/16 0550 12/19/16 0600  NA 133*  134* 137  K 5.1  5.1 4.8  CL 109  110 112*  CO2 14*  16* 19*  GLUCOSE 87  92 101*  BUN 46*  46* 45*  CREATININE 5.64*  5.54* 4.84*  CALCIUM 7.2*  7.2* 7.4*    Creat trend 2018 5.3--5.5=>4.84 2016  1.6--1.8 2010  1.5   MICRO No results found for this or any previous visit (from the past 240 hour(s)).     Lab Results  Component Value Date   CALCIUM 7.4 (L) 12/19/2016   CAION 1.15 09/20/2014   PHOS 4.7 (H) 12/19/2016   Albumin  2.5 Corrected Calcium 7.4+ 1.2=8.6            Impression: 1)Renal  AKI secondary to Prerenal/ATN               AKI on CKD               CKD stage 4 .               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                             AKI better  2)HTN  BP at goal  Medication-  On Beta blockers    3)Anemia HGb at goal (9--11) Admitted with GI bleeding GI and Primary MD following  4)CKD Mineral-Bone Disorder  Phosphorus at goal. Calcium at  goal.  5)GI-admitted with GI bleeding GI following Pt had scope  Now better  6)Electrolytes  Normokalemic  Hyponatremic Now better  7)Acid base Co2 not  at goal But better    Plan:  NO acute need of HD  Will continue current care. I educated pt about need to follow up with his nephrologist      Liana Gerold 12/19/2016, 1:34 PM

## 2016-12-19 NOTE — Discharge Summary (Signed)
Physician Discharge Summary  Aaron [name] MRN: [id number] DOB/AGE: February 04, 1956 61 y.o.  PCP: Deloria Lair., MD   Admit date: 12/14/2016 Discharge date: 12/19/2016  Discharge Diagnoses:    Principal Problem:   AKI (acute kidney injury) ([facility]) Active Problems:   Essential hypertension   Tobacco abuse   COPD (chronic obstructive pulmonary disease) ([facility])   Polycystic kidney disease   CKD (chronic kidney disease)   Hepatitis C   Rectal bleeding   Acute renal failure (ARF) ([facility])   Pain of upper abdomen     Follow-up recommendations Follow-up with PCP in 3-5 days , including all  additional recommended appointments as below Follow-up CBC, CMP in 3 days, advised patient to schedule appointment for Monday Patient follow-up in nephrology, [facility]  He will need follow up  With Dr Gala Romney in 6 weeks with GI.   Recheck  HCV RNA again in few months for reassurance  GI recommends workup for mesenteric ischemia   Unable to receive contrast for CTA, would probably need  mesenteric doppler study.      Current Discharge Medication List    START taking these medications   Details  nicotine (NICODERM CQ - DOSED IN MG/24 HOURS) 21 mg/24hr patch Place 1 patch (21 mg total) onto the skin daily. Qty: 28 patch, Refills: 0    pantoprazole (PROTONIX) 40 MG tablet Take 1 tablet (40 mg total) by mouth daily. Qty: 30 tablet, Refills: 1    sodium bicarbonate 650 MG tablet Take 1 tablet (650 mg total) by mouth 3 (three) times daily. Qty: 120 tablet, Refills: 1      CONTINUE these medications which have CHANGED   Details  torsemide (DEMADEX) 20 MG tablet Take 1 tablet (20 mg total) by mouth daily. Qty: 30 tablet, Refills: 0      CONTINUE these medications which have NOT CHANGED   Details  albuterol (PROVENTIL HFA) 108 (90 BASE) MCG/ACT inhaler Inhale 2 puffs into the lungs every 6 (six) hours as needed for wheezing or shortness of breath.     aspirin 81 MG tablet Take 81 mg  by mouth daily.      doxazosin (CARDURA) 8 MG tablet Take 8 mg by mouth daily.    HYDROcodone-acetaminophen (NORCO/VICODIN) 5-325 MG tablet Take 1 tablet by mouth every 4 (four) hours as needed. Qty: 15 tablet, Refills: 0    oxyCODONE-acetaminophen (PERCOCET) 10-325 MG per tablet Take 1 tablet by mouth every 3 (three) hours.       STOP taking these medications     omeprazole (PRILOSEC) 20 MG capsule         *  Discharge Condition: Stable  Discharge Instructions Get Medicines reviewed and adjusted: Please take all your medications with you for your next visit with your Primary MD  Please request your Primary MD to go over all hospital tests and procedure/radiological results at the follow up, please ask your Primary MD to get all Hospital records sent to his/her office.  If you experience worsening of your admission symptoms, develop shortness of breath, life threatening emergency, suicidal or homicidal thoughts you must seek medical attention immediately by calling 911 or calling your MD immediately if symptoms less severe.  You must read complete instructions/literature along with all the possible adverse reactions/side effects for all the Medicines you take and that have been prescribed to you. Take any new Medicines after you have completely understood and accpet all the possible adverse reactions/side effects.   Do not drive when  taking Pain medications.   Do not take more than prescribed Pain, Sleep and Anxiety Medications  Special Instructions: If you have smoked or chewed Tobacco in the last 2 yrs please stop smoking, stop any regular Alcohol and or any Recreational drug use.  Wear Seat belts while driving.  Please note  You were cared for by a hospitalist during your hospital stay. Once you are discharged, your primary care physician will handle any further medical issues. Please note that NO REFILLS for any discharge medications will be authorized once you are  discharged, as it is imperative that you return to your primary care physician (or establish a relationship with a primary care physician if you do not have one) for your aftercare needs so that they can reassess your need for medications and monitor your lab values.  Discharge Instructions    Diet - low sodium heart healthy    Complete by:  As directed    Increase activity slowly    Complete by:  As directed        No Known Allergies    Disposition: 01-Home or Self Care   Consults:  Nephrology GI     Significant Diagnostic Studies:  Ct Renal Stone Study  Result Date: 12/14/2016 CLINICAL DATA:  Bilateral lower abdominal pain, nausea and vomiting with decreased urinary output x3 days. EXAM: CT ABDOMEN AND PELVIS WITHOUT CONTRAST TECHNIQUE: Multidetector CT imaging of the abdomen and pelvis was performed following the standard protocol without IV contrast. COMPARISON:  09/23/2014 CT FINDINGS: Lower chest: Normal size heart. Dependent atelectasis at each lung base right greater than left. No effusion. Hepatobiliary: Multiple small cystic lesions are again noted without dominant mass. No biliary dilatation. The gallbladder is nondistended and free of stones. Pancreas: No ductal dilatation or mass. Spleen: Normal Adrenals/Urinary Tract: Normal bilateral adrenal glands. Bilateral nephromegaly secondary to innumerable cysts of varying density consistent with polycystic renal disease. No obstructive uropathy. There are nonobstructing left-sided renal calculi. No hydroureteronephrosis. The urinary bladder is unremarkable. Stomach/Bowel: There is descending and sigmoid diverticulosis without acute diverticulitis. No bowel obstruction. Normal small bowel rotation. Nondistended stomach. Normal appendix. Vascular/Lymphatic: Aortoiliac and branch vessel atherosclerosis without aneurysm. No lymphadenopathy. Reproductive: Prostate is unremarkable. Other: Fat containing inguinal hernias bilaterally left  lateral fat containing hernia is also seen in the upper abdomen. Musculoskeletal: Interbody blocks noted at the L2-3 and L3-4 interspace with marked degenerative disc disease and vacuum disc phenomenon L5-S1. Retrolisthesis grade 1 is seen of L5 on S1. Some interval disc space narrowing is also noted at L4-5 since prior 2016 exam. IMPRESSION: 1. Bilateral nephromegaly with innumerable cysts of varying density some which are simple and others hyperdense possibly representing hemorrhagic or proteinaceous cysts are identified in conjunction with cystic stable lesions of the liver and abdominal hernias. 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 2. Left-sided nephrolithiasis.  No findings of obstructive uropathy. 3. Left-sided colonic diverticulosis without acute diverticulitis. 4. Lumbar spondylosis with slight interval disc space narrowing at L4-5. Interbody blocks are again seen at L2-3 and L3-4 with slight grade 1 retrolisthesis of L5 on S1. Electronically Signed   By: Ashley Royalty M.D.   On: 12/14/2016 17:57        Filed Weights   12/15/16 0606 12/18/16 0700 12/19/16 0524  Weight: 88.3 kg (194 lb 10.7 oz) 88.3 kg (194 lb 10.7 oz) 91.9 kg (202 lb 8 oz)     Microbiology: No results found for this or any previous visit (from  the past 240 hour(s)).     Blood Culture    Component Value Date/Time   SDES BLOOD RIGHT HAND 09/23/2014 0504   SPECREQUEST BOTTLES DRAWN AEROBIC ONLY 5CC 09/23/2014 0504   CULT  09/23/2014 0504    NO GROWTH 5 DAYS Performed at [facility] 09/29/2014 FINAL 09/23/2014 0504      Labs: Results for orders placed or performed during the hospital encounter of 12/14/16 (from the past 48 hour(s))  CBC     Status: Abnormal   Collection Time: 12/18/16  5:49 AM  Result Value Ref Range   WBC 9.4 4.0 - 10.5 K/uL   RBC 2.90 (L) 4.22 - 5.81 MIL/uL   Hemoglobin 9.2 (L) 13.0 - 17.0 g/dL   HCT 27.3 (L) 39.0 - 52.0 %    MCV 94.1 78.0 - 100.0 fL   MCH 31.7 26.0 - 34.0 pg   MCHC 33.7 30.0 - 36.0 g/dL   RDW 13.5 11.5 - 15.5 %   Platelets 203 150 - 400 K/uL  CBC     Status: Abnormal   Collection Time: 12/18/16 12:17 PM  Result Value Ref Range   WBC 8.8 4.0 - 10.5 K/uL   RBC 3.00 (L) 4.22 - 5.81 MIL/uL   Hemoglobin 9.5 (L) 13.0 - 17.0 g/dL   HCT 28.2 (L) 39.0 - 52.0 %   MCV 94.0 78.0 - 100.0 fL   MCH 31.7 26.0 - 34.0 pg   MCHC 33.7 30.0 - 36.0 g/dL   RDW 13.5 11.5 - 15.5 %   Platelets 199 150 - 400 K/uL  Renal function panel     Status: Abnormal   Collection Time: 12/19/16  6:00 AM  Result Value Ref Range   Sodium 137 135 - 145 mmol/L   Potassium 4.8 3.5 - 5.1 mmol/L   Chloride 112 (H) 101 - 111 mmol/L   CO2 19 (L) 22 - 32 mmol/L   Glucose, Bld 101 (H) 65 - 99 mg/dL   BUN 45 (H) 6 - 20 mg/dL   Creatinine, Ser 4.84 (H) 0.61 - 1.24 mg/dL   Calcium 7.4 (L) 8.9 - 10.3 mg/dL   Phosphorus 4.7 (H) 2.5 - 4.6 mg/dL   Albumin 2.3 (L) 3.5 - 5.0 g/dL   GFR calc non Af Amer 12 (L) >60 mL/min   GFR calc Af Amer 14 (L) >60 mL/min    Comment: (NOTE) The eGFR has been calculated using the CKD EPI equation. This calculation has not been validated in all clinical situations. eGFR's persistently <60 mL/min signify possible Chronic Kidney Disease.    Anion gap 6 5 - 15  CBC     Status: Abnormal   Collection Time: 12/19/16  6:00 AM  Result Value Ref Range   WBC 7.4 4.0 - 10.5 K/uL   RBC 2.72 (L) 4.22 - 5.81 MIL/uL   Hemoglobin 8.6 (L) 13.0 - 17.0 g/dL   HCT 25.4 (L) 39.0 - 52.0 %   MCV 93.4 78.0 - 100.0 fL   MCH 31.6 26.0 - 34.0 pg   MCHC 33.9 30.0 - 36.0 g/dL   RDW 13.6 11.5 - 15.5 %   Platelets 208 150 - 400 K/uL     Lipid Panel     Component Value Date/Time   CHOL 186 09/23/2014 0500   TRIG 74 09/23/2014 0500   HDL 63 09/23/2014 0500   CHOLHDL 3.0 09/23/2014 0500   VLDL 15 09/23/2014 0500   LDLCALC 108 (H) 09/23/2014 0500  No results found for: HGBA1C   Lab Results  Component Value  Date   LDLCALC 108 (H) 09/23/2014   CREATININE 4.84 (H) 12/19/2016     HPI :  Aaron [name] is a 61 y.o. year old male with history of polycystic kidney disease, chronic kidney disease, presenting to ED on 9/2 with worsening flank pain, nausea, vomiting, and decreased urine output. Found to have acute on chronic kidney injury, possible gastroenteritis, and CT completed on admission as noted below.   He started having rectal bleeding overnight, approximately 5-6 episodes. Last episode around 9am this morning. Noted LLQ discomfort/cramping that was relieved with BMs. Denies NSAIDs or aspirin powders. Takes an 81 mg aspirin daily. Unintentional weight loss noted over the past 6 months per patient, stating he used to weigh 245 and now weighs in the 190s. Notes reflux at night, no dysphagia. He was hoping he could go home, then the bleeding overnight started. CBC yesterday with Hgb 13.3, with drop to  9.5 .  Last colonoscopy in 2008 with  rare sigmoid colon diverticulosis, internal hemorrhoids,. EGD also at that time with normal esophagus without evidence of Barrett's, antritis and duodenitis. Path with H.pylori gastritis   HOSPITAL COURSE:   Acute kidney injury superimposed on chronic kidney disease stage IV  Possibility acute on chronic versus natural progression, polycystic kidney disease Creatinine has been around 5.3> 5.5. Evaluated by nephrology.  Creatinine has not changed despite IV hydration This is likely his new baseline, will need close outpatient follow-up with nephrology Started on sodium bicarbonate tablets  Polycystic kidney disease   Rectal bleeding. Differential diagnosis- Diverticulosis, ischemic colitis,  ,.   Hgb   dropped from 13.8 on admission to 9.5   . Per gastroenterology,  EGD and colonoscopy revealed mild Schatzki's ring, small hiatal hernia, erosive gastropathy, colonoscopy suggestive of left-sided diverticulosis and diverticular bleeding  Hemoglobin remained  stable at around 9.2> 8.6 , slight drop prior to discharge but no evidence of recurrent bleeding Patient evaluated by GI prior to discharge Recommended to stay on a PPI long-term follow-up with GI in the outpatient setting for surveillance EGD , repeat colonoscopy in 10 years for screening purposes  Biopsy of stomach reveals non-H. pylori gastritis. Biopsy of colonic ulcers demonstrated acute ulceration; CMV stains negative. Suspected NSAID use versus transient ischemia. Findings not consistent with inflammatory bowel disease. Again, left-sided diverticulosis most likely source of bleeding .    Nausea and vomiting with bilateral flank pain. -Nausea and vomiting resolved. Now just has predominantly left-sided flank pain.  CT renal stone study on admission showed no acute findings to explain pain.  Essential hypertension.  Continue doxazosin, metoprolol, follow.  Cigarette smoker -Nicotine patch  COPD -Appears stable. Continue bronchodilators as needed.  Hepatitis C   Discharge Exam:   Blood pressure (!) 159/84, pulse 83, temperature 98.4 F (36.9 C), temperature source Oral, resp. rate 20, height _0  (1.93 m), weight 91.9 kg (202 lb 8 oz), SpO2 95 %. General:   Alert,  Well-developed, well-nourished, pleasant and cooperative in NAD Head:  Normocephalic and atraumatic. Eyes:  Sclera clear, no icterus.   Conjunctiva pink. Ears:  Normal auditory acuity. Nose:  No deformity, discharge,  or lesions. Mouth:  No deformity or lesions, dentition normal. Lungs:  Clear throughout to auscultation.   Heart:  S1 S2 present without murmurs  Abdomen:  +BS, round, distended but soft, possible ventral hernia vs significant rectus diastasis, normal bowel sounds. Mild to moderate TTP LLQ  Follow-up Information    Deloria Lair., MD. Call.   Specialty:  Family Medicine Why:  Hospital follow-up in 3-5 days, check CBC, BMP Contact information: [address] [place]  [address] [phone]        Fran Lowes, MD. Call.   Specialty:  Nephrology Why:  Hospital follow-up in 3-5 days Contact information: [address] [place] [address] [phone]        Daneil Dolin, MD. Call.   Specialty:  Gastroenterology Why:  call to make follow up appointment  Contact information: [place] [address] [phone]           Signed: Reyne Dumas 12/19/2016, 11:13 AM        Time spent >1 hour

## 2017-01-21 ENCOUNTER — Ambulatory Visit (INDEPENDENT_AMBULATORY_CARE_PROVIDER_SITE_OTHER): Payer: Medicare Other | Admitting: Physician Assistant

## 2017-01-21 ENCOUNTER — Encounter: Payer: Self-pay | Admitting: *Deleted

## 2017-01-21 ENCOUNTER — Encounter: Payer: Self-pay | Admitting: Physician Assistant

## 2017-01-21 ENCOUNTER — Encounter (INDEPENDENT_AMBULATORY_CARE_PROVIDER_SITE_OTHER): Payer: Medicare Other

## 2017-01-21 VITALS — BP 144/86 | HR 87 | Wt 196.6 lb

## 2017-01-21 DIAGNOSIS — I480 Paroxysmal atrial fibrillation: Secondary | ICD-10-CM | POA: Diagnosis not present

## 2017-01-21 DIAGNOSIS — I2510 Atherosclerotic heart disease of native coronary artery without angina pectoris: Secondary | ICD-10-CM | POA: Diagnosis not present

## 2017-01-21 DIAGNOSIS — I739 Peripheral vascular disease, unspecified: Secondary | ICD-10-CM | POA: Diagnosis not present

## 2017-01-21 DIAGNOSIS — N189 Chronic kidney disease, unspecified: Secondary | ICD-10-CM | POA: Diagnosis not present

## 2017-01-21 DIAGNOSIS — I10 Essential (primary) hypertension: Secondary | ICD-10-CM | POA: Diagnosis not present

## 2017-01-21 DIAGNOSIS — Z720 Tobacco use: Secondary | ICD-10-CM | POA: Diagnosis not present

## 2017-01-21 DIAGNOSIS — Z01818 Encounter for other preprocedural examination: Secondary | ICD-10-CM | POA: Diagnosis not present

## 2017-01-21 MED ORDER — CARVEDILOL 6.25 MG PO TABS: 6.2500 mg | ORAL_TABLET | Freq: Two times a day (BID) | ORAL | 3 refills | Status: DC

## 2017-01-21 NOTE — Progress Notes (Signed)
Cardiology Office Note    Date:  01/21/2017   ID:  Aaron [name], DOB 06-Nov-1955, MRN [id number]  PCP:  Deloria Lair., MD  Cardiologist: Dr. Percival Spanish  Chief Complaint  Patient presents with  . Pre-op Exam    History of Present Illness:  Aaron [name] is a 61 y.o. male who saw Dr. Percival Spanish 09/2014 after hospitalization for syncope etiology unclear. The EEG was negative. He did have some PAT and beta blocker started. His lisinopril was stopped secondary to elevated creatinine. Cardiac catheterization 2002 showed mild nonobstructive CAD with 80% small Dx, has history of hypertension, polycystic kidney, COPD and PAD with occluded left superficial femoral artery status post stent 09/2014. 2-D echo 2016 LVEF 50-55%. Was denied at [facility] for kidney transplant because of ongoing tobacco abuse, liver lesions and hepatitis C.  Patient is here today for preoperative surgical clearance for right inguinal hernia repair. I received records from a hospitalization at [facility] discharged 01/13/17. Review of records indicate he came in for repair of his right inguinal hernia and During the early anesthesia. He had sudden onset of atrial fibrillation with RVR at 150 bpm. Surgery was canceled and he was given a bolus of IV diltiazem and started on drip and converted to normal sinus rhythm. 2-D echo 01/14/17 showed mild LVH with mild to moderate decrease in LV systolic function ejection fraction 40-45%, abnormal diastolic function consistent with grade 1 DD, mildly dilated left atrium, normal right ventricular global systolic function.  Patient has never had atrial fibrillation before and denies any rapid heart rate since then. He denies any chest pain, tightness, pressure, dyspnea, dizziness or presyncope. 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 He was carrying a heavy couch up 3 flights of stairs and that's how he got his hernia. He does have dyspnea on exertion from cigarette  smoking and smokes about a pack every 4 days.CHADSVASC=2-3 for HTN, CAD, ? CHF. Does all his own housework and yard work and plays with his grandkids without difficulty. Interestingly he had hospitalization at [facility] 12/17/16 with rectal bleeding and underwent colonoscopy and endoscopy and didn't have any trouble with anesthesia.  According to the Revised Cardiac Risk Index (RCRI), his Perioperative Risk of Major Cardiac Event is (%): 6.6  His Functional Capacity in METs is: 7.25 according to the [facility] Activity Status Index (DASI).     Past Medical History:  Diagnosis Date  . Anxiety   . Arthritis    knees , back , shoulders  . CAD (coronary artery disease)    Mild nonobstructive disease at cardiac catheterization 2002  . Chronic back pain   . CKD (chronic kidney disease), stage II   . COPD (chronic obstructive pulmonary disease) ([place])   . Diverticulitis   . Esophageal reflux   . Essential hypertension   . Hepatitis C   . History of syncope   . Hyperlipidemia   . Migraines   . Nephrolithiasis   . PAT (paroxysmal atrial tachycardia) ([place])   . Peripheral arterial disease (HCC)    Occluded left superficial femoral artery status post stent June 2016 - Dr. Trula Slade  . Pneumonia    as child   89 years old  . Polycystic kidney, unspecified type   . Syncope 09/2014    Past Surgical History:  Procedure Laterality Date  . BACK SURGERY    . BIOPSY  12/17/2016   Procedure: BIOPSY;  Surgeon: Daneil Dolin, MD;  Location: [facility];  Service: Gastroenterology;;  gastric colon  . BUNIONECTOMY    . CERVICAL SPINE SURGERY    . COLONOSCOPY  2008   Dr. Oneida Alar: rare sigmoid colon diverticulosis, internal hemorrhoids.   . COLONOSCOPY WITH PROPOFOL N/A 12/17/2016   Procedure: COLONOSCOPY WITH PROPOFOL;  Surgeon: Daneil Dolin, MD;  Location: [facility];  Service: Gastroenterology;  Laterality: N/A;  . ESOPHAGEAL DILATION  12/17/2016   Procedure: ESOPHAGEAL DILATION;  Surgeon: Daneil Dolin, MD;  Location: [facility];  Service: Gastroenterology;;  . ESOPHAGOGASTRODUODENOSCOPY  2008   Dr. Oneida Alar: normal esophagus without Barrett's, antritis and duodenitis, path with H.pylori gastritis  . ESOPHAGOGASTRODUODENOSCOPY (EGD) WITH PROPOFOL N/A 12/17/2016   Procedure: ESOPHAGOGASTRODUODENOSCOPY (EGD) WITH PROPOFOL;  Surgeon: Daneil Dolin, MD;  Location[facility];  Service: Gastroenterology;  Laterality: N/A;  . HERNIA REPAIR     umbilical  . PERIPHERAL VASCULAR CATHETERIZATION N/A 09/20/2014   Procedure: Abdominal Aortogram;  Surgeon: Serafina Mitchell, MD;  Location: [facility];  Service: Cardiovascular;  Laterality: N/A;    Current Medications: Current Meds  Medication Sig  . amitriptyline (ELAVIL) 25 MG tablet Take 25 mg by mouth at bedtime.  Marland Kitchen aspirin 81 MG tablet Take 81 mg by mouth daily.    . cetirizine (ZYRTEC) 10 MG tablet Take 10 mg by mouth daily.  Marland Kitchen doxazosin (CARDURA) 8 MG tablet Take 8 mg by mouth daily.  . hydrOXYzine (ATARAX/VISTARIL) 25 MG tablet TAKE 1 TABLET THREE TIMES DAILY AS NEEDED  . nicotine (NICODERM CQ - DOSED IN MG/24 HOURS) 21 mg/24hr patch Place 1 patch (21 mg total) onto the skin daily.  Marland Kitchen oxyCODONE-acetaminophen (PERCOCET) 10-325 MG per tablet Take 1 tablet by mouth every 3 (three) hours.   . sodium bicarbonate 650 MG tablet Take 1 tablet (650 mg total) by mouth 3 (three) times daily.  Marland Kitchen torsemide (DEMADEX) 20 MG tablet Take 1 tablet (20 mg total) by mouth daily.  . [DISCONTINUED] diltiazem (CARDIZEM CD) 240 MG 24 hr capsule Take 240 mg by mouth daily.  . [DISCONTINUED] HYDROcodone-acetaminophen (NORCO/VICODIN) 5-325 MG tablet Take 1 tablet by mouth every 4 (four) hours as needed.     Allergies:   Patient has no known allergies.   Social History   Social History  . Marital status: Legally Separated    Spouse name: N/A  . Number of children: 2  . Years of education: N/A   Occupational History  . DISABLED    Social  History Main Topics  . Smoking status: Former Smoker    Packs/day: 1.50    Years: 47.00    Types: Cigarettes    Quit date: 12/14/2016  . Smokeless tobacco: Current User    Types: Snuff, Chew  . Alcohol use No  . Drug use: No     Comment: states in remote past he used "whatever was around"  . Sexual activity: Not on file   Other Topics Concern  . Not on file   Social History Narrative   Disabled from Back since 2007     Family History:  The patient's family history includes Alcoholism in his maternal grandfather and mother; Atrial fibrillation in his father; Colon cancer in his father; Colon cancer (age of onset: 47) in his maternal grandfather; Heart attack in his father; Heart disease in his father; Ovarian cancer in his sister; Renal cancer in his cousin.   ROS:   Please see the history of present illness.    Review  of Systems  Constitution: Negative.  HENT: Negative.   Cardiovascular: Positive for dyspnea on exertion.  Respiratory: Negative.   Endocrine: Negative.   Hematologic/Lymphatic: Negative.   Musculoskeletal: Negative.   Gastrointestinal: Negative.   Genitourinary: Negative.        Pain from inguinal hernia  Neurological: Negative.    All other systems reviewed and are negative.   PHYSICAL EXAM:   VS:  BP (!) 144/86   Pulse 87   Wt 196 lb 9.6 oz (89.2 kg)   SpO2 97%   BMI 23.93 kg/m   Physical Exam  GEN: Well nourished, well developed, in no acute distress, smells of cigarette smoke Neck: no JVD, carotid bruits, or masses Cardiac:RRR; positive S4, no significant murmur  Respiratory:  Decreased breath sounds throughout GI: soft, nontender, nondistended, + BS Ext: without cyanosis, clubbing, or edema, Good distal pulses bilaterally Neuro:  Alert and Oriented x 3 Psych: euthymic mood, full affect  Wt Readings from Last 3 Encounters:  01/21/17 196 lb 9.6 oz (89.2 kg)  12/19/16 202 lb 8 oz (91.9 kg)  01/01/16 198 lb (89.8 kg)      Studies/Labs  Reviewed:   EKG:  EKG is  ordered today.  The ekg ordered today demonstrates Normal sinus rhythm with poor R-wave progression anteriorly similar to EKG from 2016. [facility] 01/13/17 with Dr. McDowell-atrial fibrillation 156 bpm  Recent Labs: 12/17/2016: ALT 6 12/19/2016: BUN 45; Creatinine, Ser 4.84; Hemoglobin 8.6; Platelets 208; Potassium 4.8; Sodium 137   Lipid Panel    Component Value Date/Time   CHOL 186 09/23/2014 0500   TRIG 74 09/23/2014 0500   HDL 63 09/23/2014 0500   CHOLHDL 3.0 09/23/2014 0500   VLDL 15 09/23/2014 0500   LDLCALC 108 (H) 09/23/2014 0500    Additional studies/ records that were reviewed today include:  2-D echo tooth/2016Study Conclusions   - Left ventricle: Abnormal septal motion The cavity size was mildly   dilated. Wall thickness was normal. Systolic function was normal.   The estimated ejection fraction was in the range of 50% to 55%. - Left atrium: The atrium was mildly dilated. - Atrial septum: There was increased thickness of the septum,   consistent with lipomatous hypertrophy. A patent foramen ovale   cannot be excluded.   Cath 2002 ANGIOGRAPHIC DATA: 1. Left ventriculography was performed in the RAO projection.  Overall global    systolic function was normal.  No significant mitral regurgitation was    noted.  The aortic leaflets appeared to move normally.  Ejection fraction    was calculated at 59.9%. 2. The left main coronary artery demonstrates minor calcification of its    distal-most aspect but without significant focal narrowing.  This was    otherwise free of significant disease. 3. The LAD coursed to the apex.  It was a fairly large caliber vessel    providing multiple septal perforators.  Importantly, there was a minuscule    diagonal branch that appeared to have about 80% proximal narrowing.  This    vessel was not suitable for percutaneous intervention by its very small    size, being 1 mm or less. 4. There  was an extremely large ramus intermedius that bifurcated twice.    There were multiple sub branches related to this vessel, and the    intermedius branch was free of critical disease. 5. There was an AV circumflex that was small and was free of disease. 6. The right coronary artery was  a large vessel providing a large acute    marginal branch, a moderate-sized posterior descending, and moderate-sized    posterolateral branch.  The right coronary artery was free of critical    disease.   CONCLUSIONS: 1. Normal left ventricular function. 2. Minuscule diagonal branch of insignificant importance with ostial disease. 3. No other critical coronary lesions noted.   DISPOSITION:  We will obtain a spiral CT to complete the workup.  Follow-up will be with Dr. Ron Parker.  Discontinuation of smoking would be recommended. DD:  10/23/00 TD:  10/23/00 Job: [id number]    ASSESSMENT:    1. Preoperative clearance   2. Paroxysmal atrial fibrillation (HCC)   3. Essential hypertension   4. Coronary artery disease involving native coronary artery of native heart without angina pectoris   5. PAD (peripheral artery disease) ([place])   6. Tobacco abuse   7. Chronic kidney disease, unspecified CKD stage      PLAN:  In order of problems listed above:  Preoperative clearance for hernia repair patient had early anesthesia at [facility] rocking him hospital and went into rapid atrial fibrillation. He quickly converted to normal sinus rhythm with IV diltiazem. This wasn't one time occurrence for him. He does have a history of PAT but has not been bothered with it recently. They did a follow-up echo and EF was 45-50%. He was sent home on diltiazem. Discussed this patient in detail with Dr. Domenic Polite. Patient's CHADSVASC=2-3. He also has a history of nonobstructive CAD on cath in 2002. He is asymptomatic. We will place a 7 day monitor to make sure he is not going in and out of atrial fibrillation. Order Lexi scan Myoview  to rule out ischemic burden. Follow-up with Dr. Percival Spanish in 1 week for f/u and clearance. If he has recurrent atrial fibrillation he would need to be placed on Coumadin for an anti-coagulant because of this chronic kidney disease. With his LV dysfunction we will stop his diltiazem and begin Coreg 6.25 mg twice a day.  According to the Revised Cardiac Risk Index (RCRI), his Perioperative Risk of Major Cardiac Event is (%): 6.6  His Functional Capacity in METs is: 7.25 according to the [facility] Activity Status Index (DASI).    PAF one time episode while in the preanesthesia period. Placing 7 day monitor to make sure he is not going in and out of A. fib. See above for discussion on anticoagulation.  Essential hypertension blood pressure up a little today but adjusting medications  CAD with 80% diagonal and otherwise nonobstructive disease on cath in 2002. No angina.  PAD with prior stenting of the left SFA  Tobacco abuse smokes one pack every 4 days. Smoking cessation recommended  CK D secondary to polycystic kidney disease with creatinines greater than 4 followed by [facility]. Turned down for kidney transplant by [facility].   Medication Adjustments/Labs and Tests Ordered: Current medicines are reviewed at length with the patient today.  Concerns regarding medicines are outlined above.  Medication changes, Labs and Tests ordered today are listed in the Patient Instructions below. Patient Instructions  Medication Instructions:  Your physician has recommended you make the following change in your medication:  Stop Taking Diltiazem  Start Taking Coreg 6.25 mg Two Times Daily     Labwork: NONE   Testing/Procedures: Your physician has requested that you have a lexiscan myoview. For further information please visit HugeFiesta[employer][email or web address]. Please follow instruction sheet, as given.  Your physician has recommended that you wear an event monitor. Event  monitors are medical devices that record the  heart's electrical activity. Doctors most often [place] these monitors to diagnose arrhythmias. Arrhythmias are problems with the speed or rhythm of the heartbeat. The monitor is a small, portable device. You can wear one while you do your normal daily activities. This is usually used to diagnose what is causing palpitations/syncope (passing out).    Follow-Up: Your physician recommends that you schedule a follow-up appointment in: 1 Week with Dr. Percival Spanish    Any Other Special Instructions Will Be Listed Below (If Applicable).     If you need a refill on your cardiac medications before your next appointment, please call your pharmacy.  Thank you for choosing [facility]!      Signed, Ermalinda Barrios, PA-C  01/21/2017 2:10 PM    [facility] [place], [place], [place]  [address] Phone: [phone]; Fax: [phone]

## 2017-01-21 NOTE — Patient Instructions (Addendum)
Medication Instructions:  Your physician has recommended you make the following change in your medication:  Stop Taking Diltiazem  Start Taking Coreg 6.25 mg Two Times Daily     Labwork: NONE   Testing/Procedures: Your physician has requested that you have a lexiscan myoview. For further information please visit HugeFiesta[employer][email or web address]. Please follow instruction sheet, as given.  Your physician has recommended that you wear an event monitor. Event monitors are medical devices that record the heart's electrical activity. Doctors most often [place] these monitors to diagnose arrhythmias. Arrhythmias are problems with the speed or rhythm of the heartbeat. The monitor is a small, portable device. You can wear one while you do your normal daily activities. This is usually used to diagnose what is causing palpitations/syncope (passing out).    Follow-Up: Your physician recommends that you schedule a follow-up appointment in: 1 Week with Dr. Percival Spanish    Any Other Special Instructions Will Be Listed Below (If Applicable).     If you need a refill on your cardiac medications before your next appointment, please call your pharmacy.  Thank you for choosing [facility]!

## 2017-01-22 ENCOUNTER — Ambulatory Visit: Payer: Medicare Other | Admitting: Student

## 2017-01-23 ENCOUNTER — Encounter (HOSPITAL_BASED_OUTPATIENT_CLINIC_OR_DEPARTMENT_OTHER)
Admission: RE | Admit: 2017-01-23 | Discharge: 2017-01-23 | Disposition: A | Discharge status: Home / Self Care | Payer: 59 | Source: Ambulatory Visit | Attending: Physician Assistant | Admitting: Physician Assistant

## 2017-01-23 ENCOUNTER — Encounter (HOSPITAL_COMMUNITY)
Admission: RE | Admit: 2017-01-23 | Discharge: 2017-01-23 | Disposition: A | Discharge status: Home / Self Care | Payer: 59 | Source: Ambulatory Visit | Attending: Physician Assistant | Admitting: Physician Assistant

## 2017-01-23 ENCOUNTER — Encounter (HOSPITAL_COMMUNITY): Payer: Self-pay

## 2017-01-23 DIAGNOSIS — I480 Paroxysmal atrial fibrillation: Secondary | ICD-10-CM | POA: Insufficient documentation

## 2017-01-23 LAB — NM MYOCAR MULTI W/SPECT W/WALL MOTION / EF
CHL CUP NUCLEAR SDS: 0
CHL CUP NUCLEAR SRS: 6
CSEPPHR: 91 {beats}/min
LV dias vol: 183 mL (ref 62–150)
LV sys vol: 118 mL
RATE: 0.31
Rest HR: 59 {beats}/min
SSS: 6
TID: 0.99

## 2017-01-23 MED ORDER — TECHNETIUM TC 99M TETROFOSMIN IV KIT
10.0000 | PACK | Freq: Once | INTRAVENOUS | Status: AC | PRN
  Administered 2017-01-23: 10 via INTRAVENOUS

## 2017-01-23 MED ORDER — SODIUM CHLORIDE 0.9% FLUSH
INTRAVENOUS | Status: AC
  Administered 2017-01-23: 10 mL via INTRAVENOUS
  Filled 2017-01-23: qty 10

## 2017-01-23 MED ORDER — REGADENOSON 0.4 MG/5ML IV SOLN
INTRAVENOUS | Status: AC
  Administered 2017-01-23: 0.4 mg via INTRAVENOUS
  Filled 2017-01-23: qty 5

## 2017-01-23 MED ORDER — TECHNETIUM TC 99M TETROFOSMIN IV KIT
30.0000 | PACK | Freq: Once | INTRAVENOUS | Status: AC | PRN
  Administered 2017-01-23: 31 via INTRAVENOUS

## 2017-01-26 ENCOUNTER — Ambulatory Visit: Payer: Medicare Other | Admitting: Cardiology

## 2017-01-26 NOTE — Progress Notes (Signed)
Cardiology Office Note   Date:  01/27/2017   ID:  Aaron [name], DOB 1956-03-30, MRN [id number]  PCP:  Deloria Lair., MD  Cardiologist:   Minus Breeding, MD    Chief Complaint  Patient presents with  . Atrial Fibrillation      History of Present Illness: Aaron [name] is a 61 y.o. male who presents for evaluation of atrial fib.  I saw him in 2016 for evaluation of syncope.  There was no clear etiology at that time.  He was noted to have PAT.    Cardiac catheterization 2002 showed mild nonobstructive CAD with 80% small Dx.  He has  PAD with occluded left superficial femoral artery status post stent 09/2014.  He was seen last week for preop clearance prior to right inguinal hernia repair.  The surgery was canceled because of atrial fib with RVR.  Echo 01/14/17 showed mild LVH with mild to moderate decrease in LV systolic function ejection fraction 40-45%, abnormal diastolic function consistent with grade 1 DD, mildly dilated left atrium, normal right ventricular global systolic function.   He had a perfusion study with a moderate sized inferior wall infarct from apex to base without ischemia.  He returns for follow up.    He is anxious to have his surgery. He has been wearing a monitor but apparently has only had normal sinus rhythm. He actually feels well. 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 He denies any chest pain, neck or arm discomfort. He never notices any palpitations, presyncope or syncope.  He does have chronic shortness of breath from smoking and some smoker's cough but he denies any resting shortness of breath, PND or orthopnea.      Past Medical History:  Diagnosis Date  . Anxiety   . Arthritis    knees , back , shoulders  . CAD (coronary artery disease)    Mild nonobstructive disease at cardiac catheterization 2002  . Chronic back pain   . CKD (chronic kidney disease), stage II   . COPD (chronic obstructive pulmonary disease) ([place])   .  Diverticulitis   . Esophageal reflux   . Essential hypertension   . Hepatitis C   . History of syncope   . Hyperlipidemia   . Migraines   . Nephrolithiasis   . PAT (paroxysmal atrial tachycardia) ([place])   . Peripheral arterial disease (HCC)    Occluded left superficial femoral artery status post stent June 2016 - Dr. Trula Slade  . Pneumonia    as child   12 years old  . Polycystic kidney, unspecified type   . Syncope 09/2014    Past Surgical History:  Procedure Laterality Date  . BACK SURGERY    . BIOPSY  12/17/2016   Procedure: BIOPSY;  Surgeon: Daneil Dolin, MD;  [facility];  Service: Gastroenterology;;  gastric colon  . BUNIONECTOMY    . CERVICAL SPINE SURGERY    . COLONOSCOPY  2008   Dr. Oneida Alar: rare sigmoid colon diverticulosis, internal hemorrhoids.   . COLONOSCOPY WITH PROPOFOL N/A 12/17/2016   Procedure: COLONOSCOPY WITH PROPOFOL;  Surgeon: Daneil Dolin, MD;  [facility];  Service: Gastroenterology;  Laterality: N/A;  . ESOPHAGEAL DILATION  12/17/2016   Procedure: ESOPHAGEAL DILATION;  Surgeon: Daneil Dolin, MD;  [facility];  Service: Gastroenterology;;  . ESOPHAGOGASTRODUODENOSCOPY  2008   Dr. Oneida Alar: normal esophagus without Barrett's, antritis and duodenitis, path with H.pylori gastritis  . ESOPHAGOGASTRODUODENOSCOPY (  EGD) WITH PROPOFOL N/A 12/17/2016   Procedure: ESOPHAGOGASTRODUODENOSCOPY (EGD) WITH PROPOFOL;  Surgeon: Daneil Dolin, MD;  [facility];  Service: Gastroenterology;  Laterality: N/A;  . HERNIA REPAIR     umbilical  . PERIPHERAL VASCULAR CATHETERIZATION N/A 09/20/2014   Procedure: Abdominal Aortogram;  Surgeon: Serafina Mitchell, MD;  Location: [facility];  Service: Cardiovascular;  Laterality: N/A;     Current Outpatient Prescriptions  Medication Sig Dispense Refill  . amitriptyline (ELAVIL) 25 MG tablet Take 25 mg by mouth at bedtime.    Marland Kitchen aspirin 81 MG tablet Take 81 mg by mouth daily.      .  carvedilol (COREG) 12.5 MG tablet Take 1 tablet (12.5 mg total) by mouth 2 (two) times daily. 180 tablet 3  . cetirizine (ZYRTEC) 10 MG tablet Take 10 mg by mouth daily.    Marland Kitchen doxazosin (CARDURA) 8 MG tablet Take 8 mg by mouth daily.    . hydrOXYzine (ATARAX/VISTARIL) 25 MG tablet TAKE 1 TABLET THREE TIMES DAILY AS NEEDED    . nicotine (NICODERM CQ - DOSED IN MG/24 HOURS) 21 mg/24hr patch Place 1 patch (21 mg total) onto the skin daily. 28 patch 0  . oxyCODONE-acetaminophen (PERCOCET) 10-325 MG per tablet Take 1 tablet by mouth every 3 (three) hours.     . sodium bicarbonate 650 MG tablet Take 1 tablet (650 mg total) by mouth 3 (three) times daily. 120 tablet 1  . torsemide (DEMADEX) 20 MG tablet Take 1 tablet (20 mg total) by mouth daily. 30 tablet 0   No current facility-administered medications for this visit.     Allergies:   Patient has no known allergies.    ROS:  Please see the history of present illness.   Otherwise, review of systems are positive for none.   All other systems are reviewed and negative.    PHYSICAL EXAM: VS:  BP (!) 158/92   Pulse 88   Ht 6\' 4"  (1.93 m)   Wt 195 lb (88.5 kg)   BMI 23.74 kg/m  , BMI Body mass index is 23.74 kg/m. GENERAL:  Well appearing HEENT:  Pupils equal round and reactive, fundi not visualized, oral mucosa unremarkable NECK:  No jugular venous distention, waveform within normal limits, carotid upstroke brisk and symmetric, no bruits, no thyromegaly LYMPHATICS:  No cervical, inguinal adenopathy LUNGS:  Decreased BS.   BACK:  No CVA tenderness CHEST:  Unremarkable HEART:  PMI not displaced or sustained,S1 and S2 within normal limits, no S3, no S4, no clicks, no rubs, no murmurs ABD:  Flat, positive bowel sounds normal in frequency in pitch, no bruits, no rebound, no guarding, no midline pulsatile mass, no hepatomegaly, no splenomegaly EXT:  2 plus pulses upper and decreased DP/PT bilateral, no edema, no cyanosis no clubbing SKIN:  No  rashes no nodules NEURO:  Cranial nerves II through XII grossly intact, motor grossly intact throughout PSYCH:  Cognitively intact, oriented to person place and time    EKG:  EKG is not ordered today.    Recent Labs: 12/17/2016: ALT 6 12/19/2016: BUN 45; Creatinine, Ser 4.84; Hemoglobin 8.6; Platelets 208; Potassium 4.8; Sodium 137     Wt Readings from Last 3 Encounters:  01/27/17 195 lb (88.5 kg)  01/21/17 196 lb 9.6 oz (89.2 kg)  12/19/16 202 lb 8 oz (91.9 kg)      Other studies Reviewed: Additional studies/ records that were reviewed today include: [facility] records, [employer]. Review of the above  records demonstrates:  Please see elsewhere in the note.     ASSESSMENT AND PLAN:    ATRIAL FIB:  Mr. IRIE FIORELLO has a CHA2DS2 - VASc score of 3.  I received and verified his EKG.  He did have atrial fib.  He needs to have warfarin started after his upcoming surgery and he and I discussed the risks benefits of this. Rate will be managed by titrating his beta blocker for cardiomyopathy.  CARDIOMYOPATHY:  I'm going to increase his carvedilol 12.5 mg twice daily. He still holding off on dialysis he can't be started on ACE or an ARB but in the future could be started on hydralazine and nitrates.  CAD:  I presume he has obstructive coronary disease with probably an old inferior infarct with no ischemia. He needs to be managed with aggressive risk reduction..    DYSLIPIDEMIA:  He needs a fasting lipid profile and should be on a stating based on these results.   CKD IV:  As above.  He is followed by nephrology.  PREOP:  The patient is at moderate risk but not prohibitive for his inguinal hernia repair. He needs to have that surgery. He didn't have any high risk findings on his perfusion study and has a high functional level. No further cardiovascular testing or change in medicines as indicated. This would serve as his preoperative clearance with the caveat that his rhythm needs  to be followed closely pre-post-perioperatively. He should have judicious fluid management given the reduced ejection fraction.   Current medicines are reviewed at length with the patient today.  The patient does not have concerns regarding medicines.  The following changes have been made:  As above.    Labs/ tests ordered today include: None No orders of the defined types were placed in this encounter.    Disposition:   FU with Estella Husk PAc after the hernia surgery.      Signed, Minus Breeding, MD  01/27/2017 5:18 PM    [facility]

## 2017-01-27 ENCOUNTER — Encounter: Payer: Self-pay | Admitting: Cardiology

## 2017-01-27 ENCOUNTER — Ambulatory Visit (INDEPENDENT_AMBULATORY_CARE_PROVIDER_SITE_OTHER): Payer: 59 | Admitting: Cardiology

## 2017-01-27 VITALS — BP 158/92 | HR 88 | Ht 76.0 in | Wt 195.0 lb

## 2017-01-27 DIAGNOSIS — Z01818 Encounter for other preprocedural examination: Secondary | ICD-10-CM

## 2017-01-27 DIAGNOSIS — I255 Ischemic cardiomyopathy: Secondary | ICD-10-CM | POA: Insufficient documentation

## 2017-01-27 DIAGNOSIS — I2510 Atherosclerotic heart disease of native coronary artery without angina pectoris: Secondary | ICD-10-CM

## 2017-01-27 DIAGNOSIS — I480 Paroxysmal atrial fibrillation: Secondary | ICD-10-CM

## 2017-01-27 DIAGNOSIS — E785 Hyperlipidemia, unspecified: Secondary | ICD-10-CM

## 2017-01-27 DIAGNOSIS — N184 Chronic kidney disease, stage 4 (severe): Secondary | ICD-10-CM | POA: Diagnosis not present

## 2017-01-27 MED ORDER — CARVEDILOL 12.5 MG PO TABS: 12.5000 mg | ORAL_TABLET | Freq: Two times a day (BID) | ORAL | 3 refills | Status: DC

## 2017-01-27 NOTE — Patient Instructions (Signed)
Medication Instructions:  INCREASE- Carvedilol 12.5 mg twice a day  If you need a refill on your cardiac medications before your next appointment, please call your pharmacy.  Labwork: None Ordered   Testing/Procedures: None Ordered  Follow-Up: Your physician wants you to follow-up in: 1 Month with Estella Husk in [place].    Thank you for choosing [facility] at [facility]!!

## 2017-01-28 ENCOUNTER — Telehealth: Payer: Self-pay | Admitting: Cardiology

## 2017-01-28 NOTE — Telephone Encounter (Signed)
New message    Patient calling to confirm clearance for surgery has been sent to surgeons office. Patient request call back to confirm paperwork has been sent. Please call.

## 2017-01-28 NOTE — Telephone Encounter (Signed)
Spoke with pt letting him know that Dr Percival Spanish office note was faxed to [place] office.

## 2017-02-02 ENCOUNTER — Encounter: Payer: Self-pay | Admitting: Gastroenterology

## 2017-02-02 ENCOUNTER — Telehealth: Payer: Self-pay | Admitting: Gastroenterology

## 2017-02-02 ENCOUNTER — Ambulatory Visit: Payer: Medicare Other | Admitting: Gastroenterology

## 2017-02-02 NOTE — Telephone Encounter (Signed)
Patient was a no show and letter sent  °

## 2017-02-12 DIAGNOSIS — I219 Acute myocardial infarction, unspecified: Secondary | ICD-10-CM

## 2017-02-12 HISTORY — DX: Acute myocardial infarction, unspecified: I21.9

## 2017-02-12 HISTORY — PX: INGUINAL HERNIA REPAIR: SUR1180

## 2017-02-13 ENCOUNTER — Telehealth: Payer: Self-pay | Admitting: Cardiology

## 2017-02-13 MED ORDER — WARFARIN SODIUM 5 MG PO TABS: ORAL_TABLET | ORAL | 0 refills | Status: DC

## 2017-02-13 NOTE — Telephone Encounter (Signed)
He is supposed to start warfarin once he has had his surgery.  Please refer to the [facility] Clinic to prescribe and follow up.

## 2017-02-13 NOTE — Telephone Encounter (Signed)
New message    Pt went to the pharmacy to pick up meds and told the pharmacist that he was to have a prescription on warfarin called in ? Please verify and give [facility] a call

## 2017-02-13 NOTE — Telephone Encounter (Signed)
Message sent to coumadin clinic.

## 2017-02-13 NOTE — Telephone Encounter (Signed)
Spoke to patient he stated he went to pick up his medications and coumadin prescription not at pharmacy.Stated Dr.Hochrein prescribed to him when he was in hospital.Advised I will send message to Dr.Hochrein.

## 2017-02-19 ENCOUNTER — Telehealth: Payer: Self-pay | Admitting: Gastroenterology

## 2017-02-19 NOTE — Telephone Encounter (Signed)
LMOM to call.

## 2017-02-19 NOTE — Telephone Encounter (Signed)
Pt called to say that he needed OV ASAP so he can start his dialysis. I told him the only OV available would be 12/24 at 130. He wants something sooner because his kidney doctor wants [place] to follow up with him prior to starting his dialysis. I told him that his kidney doctor can call and speak to provider here about his condition, but I had nothing available at the moment unless there's a cancellation. Please advise if he needs an URG spot. [phone]

## 2017-02-20 ENCOUNTER — Encounter: Payer: Self-pay | Admitting: Gastroenterology

## 2017-02-20 ENCOUNTER — Other Ambulatory Visit: Payer: Self-pay | Admitting: *Deleted

## 2017-02-20 ENCOUNTER — Ambulatory Visit (INDEPENDENT_AMBULATORY_CARE_PROVIDER_SITE_OTHER): Payer: 59 | Admitting: Gastroenterology

## 2017-02-20 ENCOUNTER — Other Ambulatory Visit (HOSPITAL_COMMUNITY): Payer: Self-pay | Admitting: *Deleted

## 2017-02-20 ENCOUNTER — Telehealth: Payer: Self-pay | Admitting: *Deleted

## 2017-02-20 VITALS — BP 169/98 | HR 77 | Temp 97.5°F | Ht 76.0 in | Wt 206.6 lb

## 2017-02-20 DIAGNOSIS — R1030 Lower abdominal pain, unspecified: Secondary | ICD-10-CM

## 2017-02-20 DIAGNOSIS — K625 Hemorrhage of anus and rectum: Secondary | ICD-10-CM

## 2017-02-20 DIAGNOSIS — Z8619 Personal history of other infectious and parasitic diseases: Secondary | ICD-10-CM | POA: Diagnosis not present

## 2017-02-20 DIAGNOSIS — R109 Unspecified abdominal pain: Secondary | ICD-10-CM | POA: Insufficient documentation

## 2017-02-20 NOTE — Assessment & Plan Note (Signed)
More consistent with IBS. Mesenteric duplex ordered as noted.

## 2017-02-20 NOTE — Telephone Encounter (Signed)
Patient advised me in the office to call his daughter [name] to schedule his test. Called [facility] to get the mesenteric duplex scheduled. Was told this is done at [facility]. [facility] with [facility] department at [facility]. This is scheduled for 03/02/17 at 11:00am w/ arrival time at 10:45am. Nothing to eat after 11pm the night before procedure, patient can have water. 24 hrs prior to test no foods that produce gas. The morning of test: no gum, no smoking, no breakfast, no carbonated drinks. I spoke with pt daughter [name] and made aware of appt details/recs. She verbalized understanding and needed nothing further

## 2017-02-20 NOTE — Telephone Encounter (Signed)
Pt called and is aware to be here today at 0930

## 2017-02-20 NOTE — Progress Notes (Signed)
Referring Provider: Deloria Lair., MD Primary Care Physician:  Deloria Lair., MD Primary GI: Dr. Oneida Alar  Chief Complaint  Patient presents with  . Follow-up    HPI:   61 year old male recently inpatient with acute on chronic kidney injury, rectal bleeding, possible history of positive Hep C but negative RNA. Likely cleared Hep C on own or false positive HCV antibody. Colonoscopy completed during admission with dense left-sided diverticulosis, right colon ulcers s/p biopsy query occult NSAID use vs transient ischemia, not consistent with IBD. CMV stains negative. Will need non-invasive evaluation of mesenteric vasculature in an elective setting. Recheck Hep C antibody and RNA today.   EGD mild Schatzki's ring s/p dilatation, small hiatal hernia, erosive gastropathy. (negative H.pylori gastritis).   Just had right inguinal hernia repair a few weeks ago. Had a heart attack in interim from when last seen. States he was at [place]. Possibly starting Coumadin on Monday. Will be starting dialysis in the future. Sometimes sharp pain in lower abdomen after eating. States he is not taking Ibuprofen any longer. Used to take 4-5 a day. Used to take a lot of BC powders. No further rectal bleeding. Feels better after having BM.   Past Medical History:  Diagnosis Date  . Anxiety   . Arthritis    knees , back , shoulders  . CAD (coronary artery disease)    Mild nonobstructive disease at cardiac catheterization 2002  . Chronic back pain   . CKD (chronic kidney disease), stage II   . COPD (chronic obstructive pulmonary disease) ([place])   . Diverticulitis   . Esophageal reflux   . Essential hypertension   . Hepatitis C   . History of syncope   . Hyperlipidemia   . Migraines   . Nephrolithiasis   . PAT (paroxysmal atrial tachycardia) ([place])   . Peripheral arterial disease (HCC)    Occluded left superficial femoral artery status post stent June 2016 - Dr. Trula Slade  . Pneumonia    as  child   53 years old  . Polycystic kidney, unspecified type   . Syncope 09/2014    Past Surgical History:  Procedure Laterality Date  . BACK SURGERY    . BUNIONECTOMY    . CERVICAL SPINE SURGERY    . COLONOSCOPY  2008   Dr. Oneida Alar: rare sigmoid colon diverticulosis, internal hemorrhoids.   . ESOPHAGOGASTRODUODENOSCOPY  2008   Dr. Oneida Alar: normal esophagus without Barrett's, antritis and duodenitis, path with H.pylori gastritis  . HERNIA REPAIR     umbilical  NOTE: Patient  [place] incomplete in this note due to computer issues. See history tab for complete information. History tab information was reviewed with the patient and found to be correct.    Current Outpatient Medications  Medication Sig Dispense Refill  . carvedilol (COREG) 12.5 MG tablet Take 1 tablet (12.5 mg total) by mouth 2 (two) times daily. 180 tablet 3  . doxazosin (CARDURA) 8 MG tablet Take 8 mg by mouth daily.    . hydrOXYzine (ATARAX/VISTARIL) 25 MG tablet TAKE 1 TABLET THREE TIMES DAILY AS NEEDED    . oxyCODONE-acetaminophen (PERCOCET) 10-325 MG per tablet Take 1 tablet by mouth every 3 (three) hours.     . pantoprazole (PROTONIX) 40 MG tablet Take 40 mg daily by mouth.  1  . sodium bicarbonate 650 MG tablet Take 1 tablet (650 mg total) by mouth 3 (three) times daily. 120 tablet 1  . torsemide (DEMADEX) 20 MG tablet Take 1  tablet (20 mg total) by mouth daily. 30 tablet 0  . warfarin (COUMADIN) 5 MG tablet Take 1 tablet by mouth daily or as directed.  Start with first dose on Friday Nov 9. 30 tablet 0   No current facility-administered medications for this visit.     Allergies as of 02/20/2017  . (No Known Allergies)    Family History  Problem Relation Age of Onset  . Alcoholism Mother   . Heart disease Father        Massive heart attack  . Heart attack Father   . Atrial fibrillation Father   . Colon cancer Father   . Colon cancer Maternal Grandfather 78  . Alcoholism Maternal Grandfather   . Renal cancer  Cousin   . Ovarian cancer Sister     Social History   Socioeconomic History  . Marital status: Legally Separated    Spouse name: None  . Number of children: 2  . Years of education: None  . Highest education level: None  Social Needs  . Financial resource strain: None  . Food insecurity - worry: None  . Food insecurity - inability: None  . Transportation needs - medical: None  . Transportation needs - non-medical: None  Occupational History  . Occupation: DISABLED  Tobacco Use  . Smoking status: Former Smoker    Packs/day: 1.50    Years: 47.00    Pack years: 70.50    Types: Cigarettes    Last attempt to quit: 12/14/2016    Years since quitting: 0.1  . Smokeless tobacco: Former Systems developer    Types: Snuff, Chew  Substance and Sexual Activity  . Alcohol use: No    Alcohol/week: 0.0 oz  . Drug use: No    Comment: states in remote past he used "whatever was around"  . Sexual activity: None  Other Topics Concern  . None  Social History Narrative   Disabled from Back since 2007    Review of Systems: Gen: Denies fever, chills, anorexia. Denies fatigue, weakness, weight loss.  CV: Denies chest pain, palpitations, syncope, peripheral edema, and claudication. Resp: Denies dyspnea at rest, cough, wheezing, coughing up blood, and pleurisy. GI: see HPI  Derm: Denies rash, itching, dry skin Psych: Denies depression, anxiety, memory loss, confusion. No homicidal or suicidal ideation.  Heme: Denies bruising, bleeding, and enlarged lymph nodes.  Physical Exam: BP (!) 169/98   Pulse 77   Temp (!) 97.5 F (36.4 C) (Oral)   Ht 6\' 4"  (1.93 m)   Wt 206 lb 9.6 oz (93.7 kg)   BMI 25.15 kg/m  General:   Alert and oriented. No distress noted. Pleasant and cooperative.  Head:  Normocephalic and atraumatic. Eyes:  Conjuctiva clear without scleral icterus. Mouth:  Oral mucosa pink and moist.  Abdomen:  +BS, soft, non-tender and non-distended. No rebound or guarding. No HSM or masses  noted. Msk:  Symmetrical without gross deformities. Normal posture. Extremities:  Without edema. Neurologic:  Alert and  oriented x4 Psych:  Alert and cooperative. Normal mood and affect.

## 2017-02-20 NOTE — Assessment & Plan Note (Signed)
Reported history of Hep C but RNA negative while inpatient. Likely cleared Hep C on own or false positive HCV antibody. Final recheck RNA now.

## 2017-02-20 NOTE — Assessment & Plan Note (Signed)
Recently inpatient with rectal bleeding, now resolved. Right colon ulcers on colonoscopy likely secondary to occult NSAID use, which he now is avoiding. Unable to rule out transient ischemia. Will pursue mesenteric duplex to assess vasculature. Does not seem to have classic symptoms of chronic mesenteric ischemia. Continue to avoid NSAIDs, aspirin powders. Return in 3 months.

## 2017-02-20 NOTE — Telephone Encounter (Signed)
I tried to call patient to let him know we could see him today at 0930, but had to [facility]. I tried calling his emergency contact (Whitney-daughter) and [facility] for her to have patient call us.

## 2017-02-20 NOTE — Patient Instructions (Signed)
I have ordered an ultrasound of your abdomen to see if there is any narrowing of the vessels in your abdomen. I believe your colon ulcers were likely from the Ibuprofen and aspirin powders.   Please have blood work when you have the ultrasound done.  We will see you in 3 months!

## 2017-02-23 ENCOUNTER — Encounter: Payer: Self-pay | Admitting: *Deleted

## 2017-02-23 ENCOUNTER — Telehealth: Payer: Self-pay | Admitting: Gastroenterology

## 2017-02-23 ENCOUNTER — Other Ambulatory Visit: Payer: Self-pay | Admitting: Gastroenterology

## 2017-02-23 ENCOUNTER — Encounter: Payer: Self-pay | Admitting: Nurse Practitioner

## 2017-02-23 NOTE — Telephone Encounter (Signed)
Got a call from [facility] regarding mesenteric duplex. Needs to have mesenteric duplex at [facility]. Will be outpatient facility, not at [facility]. This is done with ensure and assessed pre and post drinking ensure. Please cancel at [facility]. May still schedule it with Butch Penny, scheduler who originally scheduled this procedure. Please cancel the one that is ordered at [facility]. I have copied Rosendo Gros as she may have some insight on how to do this.

## 2017-02-23 NOTE — Progress Notes (Signed)
cc'ed to pcp °

## 2017-02-23 NOTE — Telephone Encounter (Signed)
Called [facility] and spoke with Butch Penny. Appt cancelled. She advised for mesenteric duplex these are scheduled at the [facility] office [phone] or [address] [phone].  Spoke with Felicia at the [facility] office and she scheduled test for 04/01/17 at 10:00am w/ arrival time 9:45am. NPO after midnight. Per felicia they will also contact patient regarding appt info/directions.   LMOVM for pt daughter [name]

## 2017-02-24 ENCOUNTER — Telehealth: Payer: Self-pay | Admitting: *Deleted

## 2017-02-24 ENCOUNTER — Encounter: Payer: Self-pay | Admitting: *Deleted

## 2017-02-24 LAB — HCV RNA QUANT RFLX ULTRA OR GENOTYP: HCV Quant Baseline: NOT DETECTED IU/mL

## 2017-02-24 NOTE — Progress Notes (Signed)
Cardiology Office Note    Date:  02/25/2017   ID:  [name], DOB 07/13/1955, MRN [id number]  PCP:  Deloria Lair., MD  Cardiologist: Dr. Percival Spanish  Chief Complaint  Patient presents with  . Follow-up    History of Present Illness:  Aaron [name] is a 61 y.o. male who saw Dr. Percival Spanish 09/2014 after hospitalization for syncope etiology unclear. The EEG was negative. He did have some PAT and beta blocker started. His lisinopril was stopped secondary to elevated creatinine. Cardiac catheterization 2002 showed mild nonobstructive CAD with 80% small Dx, has history of hypertension, polycystic kidney, COPD and PAD with occluded left superficial femoral artery status post stent 09/2014. 2-D echo 2016 LVEF 50-55%. Was denied at [facility] for kidney transplant because of ongoing tobacco abuse, liver lesions and hepatitis C.   I saw patient for preoperative surgical clearance for right inguinal hernia repair.  While undergoing inguinal hernia repair and in the early stages of anesthesia he had sudden onset of A. fib with RVR to 150 bpm.  Surgery was canceled and he was given a bolus of diltiazem.  2D echo 01/14/17 mild LVH with mild to moderate decreased LV function ejection fraction 40-45% with abnormal diastolic function with grade 1 DD.  CHA2DS2-VASc score equals 3.  I ordered a nuclear stress test that was done 01/23/17 and showed moderate-sized inferior wall infarct from apex to base without ischemia.  48-hour monitor showed normal sinus rhythm with rare skipping and no atrial fibrillation.  Dr. Percival Spanish saw him 01/27/17 and recommended Coumadin after he has his inguinal hernia repair.  He also increased carvedilol to 12.5 mg twice daily for his cardiomyopathy.  Felt he could be started on hydralazine and nitrates in the future for his cardiomyopathy but not ACE inhibitor because he still holding off on dialysis.  Felt he had presumed CAD with wall motion abnormalities on perfusion study.  Was felt to  be at moderate risk for surgery.  It has since been having abdominal pain and is scheduled for mesenteric duplex 03/02/17.  Patient had hernia repair 2 1/2 weeks ago without any problems. Then went to [place] to visit his sister. Awakened with heart racing 2-3/night lasts 2-3 min and then ok.  Heart racing is occurring every night.  He did not have any of this when he wore the monitor.  BP is up today.  He says he is watching his salt closely.  Also complains of shortness of breath and says he cannot lay down at night.  He ran out of his inhaler and has no primary care physician at this time.  Has sharp shooting pains in his abdomen and chest but no chest tightness or pressure.  Complains of right leg cramping when walking in [facility].  Had previous stenting left SFA in 2016 by Dr. Trula Slade.  Past Medical History:  Diagnosis Date  . Anxiety   . Arthritis    knees , back , shoulders  . CAD (coronary artery disease)    Mild nonobstructive disease at cardiac catheterization 2002  . Chronic back pain   . CKD (chronic kidney disease), stage II   . COPD (chronic obstructive pulmonary disease) ([place])   . Diverticulitis   . Esophageal reflux   . Essential hypertension   . Hepatitis C   . History of syncope   . Hyperlipidemia   . Migraines   . Nephrolithiasis   . PAT (paroxysmal atrial tachycardia) ([place])   . Peripheral arterial disease (  [place])    Occluded left superficial femoral artery status post stent June 2016 - Dr. Trula Slade  . Pneumonia    as child   35 years old  . Polycystic kidney, unspecified type   . Syncope 09/2014    Past Surgical History:  Procedure Laterality Date  . BACK SURGERY    . BUNIONECTOMY    . CERVICAL SPINE SURGERY    . COLONOSCOPY  2008   Dr. Oneida Alar: rare sigmoid colon diverticulosis, internal hemorrhoids.   . ESOPHAGOGASTRODUODENOSCOPY  2008   Dr. Oneida Alar: normal esophagus without Barrett's, antritis and duodenitis, path with H.pylori gastritis  . HERNIA REPAIR      umbilical    Current Medications: Current Meds  Medication Sig  . doxazosin (CARDURA) 8 MG tablet Take 8 mg by mouth daily.  . hydrOXYzine (ATARAX/VISTARIL) 25 MG tablet TAKE 1 TABLET THREE TIMES DAILY AS NEEDED  . oxyCODONE-acetaminophen (PERCOCET) 10-325 MG per tablet Take 1 tablet by mouth every 3 (three) hours.   . pantoprazole (PROTONIX) 40 MG tablet Take 40 mg daily by mouth.  . sodium bicarbonate 650 MG tablet Take 1 tablet (650 mg total) by mouth 3 (three) times daily.  Marland Kitchen torsemide (DEMADEX) 20 MG tablet Take 1 tablet (20 mg total) by mouth daily.  Marland Kitchen warfarin (COUMADIN) 5 MG tablet Take 1 tablet by mouth daily or as directed.  Start with first dose on Friday Nov 9.  . [DISCONTINUED] carvedilol (COREG) 12.5 MG tablet Take 1 tablet (12.5 mg total) by mouth 2 (two) times daily.     Allergies:   Patient has no known allergies.   Social History   Socioeconomic History  . Marital status: Legally Separated    Spouse name: None  . Number of children: 2  . Years of education: None  . Highest education level: None  Social Needs  . Financial resource strain: None  . Food insecurity - worry: None  . Food insecurity - inability: None  . Transportation needs - medical: None  . Transportation needs - non-medical: None  Occupational History  . Occupation: DISABLED  Tobacco Use  . Smoking status: Former Smoker    Packs/day: 1.50    Years: 47.00    Pack years: 70.50    Types: Cigarettes    Last attempt to quit: 12/14/2016    Years since quitting: 0.2  . Smokeless tobacco: Former Systems developer    Types: Snuff, Chew  Substance and Sexual Activity  . Alcohol use: No    Alcohol/week: 0.0 oz  . Drug use: No    Comment: states in remote past he used "whatever was around"  . Sexual activity: None  Other Topics Concern  . None  Social History Narrative   Disabled from Back since 2007     Family History:  The patient's  family history includes Alcoholism in his maternal grandfather and  mother; Atrial fibrillation in his father; Colon cancer in his father; Colon cancer (age of onset: 32) in his maternal grandfather; Heart attack in his father; Heart disease in his father; Ovarian cancer in his sister; Renal cancer in his cousin.   ROS:   Please see the history of present illness.    Review of Systems  Constitution: Negative.  HENT: Negative.   Cardiovascular: Positive for chest pain, dyspnea on exertion, irregular heartbeat, orthopnea and palpitations.  Respiratory: Positive for shortness of breath, sleep disturbances due to breathing and wheezing.   Endocrine: Negative.   Hematologic/Lymphatic: Negative.   Musculoskeletal: Positive for muscle  cramps.  Gastrointestinal: Negative.   Genitourinary: Negative.   Neurological: Negative.    All other systems reviewed and are negative.   PHYSICAL EXAM:   VS:  BP (!) 150/90   Pulse 67   Ht 6\' 4"  (1.93 m)   Wt 208 lb (94.3 kg)   SpO2 98%   BMI 25.32 kg/m   Physical Exam  GEN: Well nourished, well developed, in no acute distress  Neck: no JVD, carotid bruits, or masses Cardiac:RRR; no murmurs, rubs, or gallops  Respiratory: Decreased breath sounds at bases with upper respiratory wheezing  GI: soft, nontender, nondistended, + BS Ext: without cyanosis, clubbing, or edema, Good distal pulses bilaterally Neuro:  Alert and Oriented x 3, Strength and sensation are intact Psych: euthymic mood, full affect  Wt Readings from Last 3 Encounters:  02/25/17 208 lb (94.3 kg)  02/20/17 206 lb 9.6 oz (93.7 kg)  01/27/17 195 lb (88.5 kg)      Studies/Labs Reviewed:   EKG:  EKG is not ordered today.   Recent Labs: 12/17/2016: ALT 6 12/19/2016: BUN 45; Creatinine, Ser 4.84; Hemoglobin 8.6; Platelets 208; Potassium 4.8; Sodium 137   Lipid Panel    Component Value Date/Time   CHOL 186 09/23/2014 0500   TRIG 74 09/23/2014 0500   HDL 63 09/23/2014 0500   CHOLHDL 3.0 09/23/2014 0500   VLDL 15 09/23/2014 0500   LDLCALC 108 (H)  09/23/2014 0500    Additional studies/ records that were reviewed today include:   2D echo 01/14/17 mild LVH with mild to moderate decreased LV function ejection fraction 40-45% with abnormal diastolic function with grade 1 DD.   2-D echo tooth/2016Study Conclusions   - Left ventricle: Abnormal septal motion The cavity size was mildly   dilated. Wall thickness was normal. Systolic function was normal.   The estimated ejection fraction was in the range of 50% to 55%. - Left atrium: The atrium was mildly dilated. - Atrial septum: There was increased thickness of the septum,   consistent with lipomatous hypertrophy. A patent foramen ovale   cannot be excluded.   Cath 2002 ANGIOGRAPHIC DATA: 1. Left ventriculography was performed in the RAO projection.  Overall global    systolic function was normal.  No significant mitral regurgitation was    noted.  The aortic leaflets appeared to move normally.  Ejection fraction    was calculated at 59.9%. 2. The left main coronary artery demonstrates minor calcification of its    distal-most aspect but without significant focal narrowing.  This was    otherwise free of significant disease. 3. The LAD coursed to the apex.  It was a fairly large caliber vessel    providing multiple septal perforators.  Importantly, there was a minuscule    diagonal branch that appeared to have about 80% proximal narrowing.  This    vessel was not suitable for percutaneous intervention by its very small    size, being 1 mm or less. 4. There was an extremely large ramus intermedius that bifurcated twice.    There were multiple sub branches related to this vessel, and the    intermedius branch was free of critical disease. 5. There was an AV circumflex that was small and was free of disease. 6. The right coronary artery was a large vessel providing a large acute    marginal branch, a moderate-sized posterior descending, and moderate-sized    posterolateral branch.   The right coronary artery was free of critical  disease.   CONCLUSIONS: 1. Normal left ventricular function. 2. Minuscule diagonal branch of insignificant importance with ostial disease. 3. No other critical coronary lesions noted.   DISPOSITION:  We will obtain a spiral CT to complete the workup.  Follow-up will be with Dr. Ron Parker.  Discontinuation of smoking would be recommended. DD:  10/23/00 TD:  10/23/00 Job: [id number]      Nuclear stress test 01/23/17  Findings consistent with prior myocardial infarction.  This is an intermediate risk study.  The left ventricular ejection fraction is moderately decreased (30-44%).  There was no ST segment deviation noted during stress.   Moderate sized inferior wall infarct from apex to base no ischemia EF estimated at 36% with diffuse hypokinesis worse in inferior wall   48-hour monitor showed normal sinus rhythm with rare PVC no atrial fibrillation 01/22/17   ASSESSMENT:    1. Paroxysmal atrial fibrillation (HCC)   2. Ischemic cardiomyopathy   3. Coronary artery disease involving native coronary artery of native heart without angina pectoris   4. Essential hypertension   5. Tobacco abuse   6. Chronic kidney disease, unspecified CKD stage   7. PAD (peripheral artery disease) ([place])   8. Mixed simple and mucopurulent chronic bronchitis (HCC)      PLAN:  In order of problems listed above:  PAF while undergoing anesthesia converted to normal sinus rhythm quickly.  Chadsvasc score equals 3 so Coumadin recommended after his inguinal hernia repair.  He is to start his Coumadin today and will follow-up in our [place] office next Tuesday for an INR.  He is having rapid heart rates wake him from sleep and his blood pressure is elevated today.  Will increase carvedilol to 25 mg twice daily.  He is to call if this does not help.  His heart rate is regular today.  Follow-up with Dr. Percival Spanish in [place] office in 1-2 months or myself in  [place].  Ischemic cardiomyopathy ejection fraction 45% on most recent echo 01/14/17 EF 36% on myoview. carvedilol increased, no ACEI/ARB secondary to kidneys.  Patient is short of breath today.  He is wheezing and does not have significant heart failure on exam.  Will check a BNP.  He ran out of his inhalers.  Will refer to primary care as he has no physician.  I have asked him to take an extra Demadex today and tomorrow to see if this helps.   CAD with wall motion abnormalities on recent nuclear stress test and echo asymptomatic and no ischemia.  Risk factor modification recommended.  History of 80% diagonal and otherwise nonobstructive disease on cath in 2002.  No angina  Essential hypertension blood pressure high today.  Increase carvedilol to 25 mg twice daily.  2 g sodium diet.  Tobacco abuse still smoking a pack every 4 days.  Smoking cessation discussed.  CKD secondary to polycystic kidney disease creatinine is greater than 4 followed by [facility].  He has been turned down for kidney transplant at [facility].  Holding off on dialysis.  PAD with left SFA in 2016 by Dr. Trula Slade now with right calf claudication.  Will refer back.  Medication Adjustments/Labs and Tests Ordered: Current medicines are reviewed at length with the patient today.  Concerns regarding medicines are outlined above.  Medication changes, Labs and Tests ordered today are listed in the Patient Instructions below. Patient Instructions  Medication Instructions:  Your physician has recommended you make the following change in your medication:  Increase Coreg to 25 mg  Two Times Daily  Increase Demadex to 40 mg Daily for Two Days then resume 20 mg Daily.    Labwork: Your physician recommends that you return for lab work in: Today    Testing/Procedures: NONE   Follow-Up: Your physician recommends that you schedule a follow-up appointment in: [place]    Any Other Special Instructions Will Be Listed Below (If  Applicable).     If you need a refill on your cardiac medications before your next appointment, please call your pharmacy. Thank you for choosing [facility]!       Sumner Boast, PA-C  02/25/2017 12:14 PM    [facility] [address], [place], [place]  [address] Phone: [phone]; Fax: [phone]

## 2017-02-24 NOTE — Telephone Encounter (Signed)
Reviewed

## 2017-02-24 NOTE — Telephone Encounter (Signed)
Hep C RNA dated Feb 23, 2017 was received. This is negative. Please let patient know.

## 2017-02-24 NOTE — Telephone Encounter (Signed)
LMOVM for patient and daughter [name]

## 2017-02-24 NOTE — Telephone Encounter (Signed)
Lab results received from [facility] and placed on AB desk.

## 2017-02-24 NOTE — Telephone Encounter (Signed)
Spoke with pt daughter [name] and is aware of new appt details/location. She verbalized understanding and had no questions.

## 2017-02-25 ENCOUNTER — Encounter: Payer: Self-pay | Admitting: Physician Assistant

## 2017-02-25 ENCOUNTER — Ambulatory Visit (INDEPENDENT_AMBULATORY_CARE_PROVIDER_SITE_OTHER): Payer: 59 | Admitting: Physician Assistant

## 2017-02-25 ENCOUNTER — Other Ambulatory Visit (HOSPITAL_COMMUNITY)
Admission: RE | Admit: 2017-02-25 | Discharge: 2017-02-25 | Disposition: A | Discharge status: Home / Self Care | Payer: 59 | Source: Ambulatory Visit | Attending: Physician Assistant | Admitting: Physician Assistant

## 2017-02-25 VITALS — BP 150/90 | HR 67 | Ht 76.0 in | Wt 208.0 lb

## 2017-02-25 DIAGNOSIS — J418 Mixed simple and mucopurulent chronic bronchitis: Secondary | ICD-10-CM

## 2017-02-25 DIAGNOSIS — N189 Chronic kidney disease, unspecified: Secondary | ICD-10-CM | POA: Insufficient documentation

## 2017-02-25 DIAGNOSIS — I739 Peripheral vascular disease, unspecified: Secondary | ICD-10-CM

## 2017-02-25 DIAGNOSIS — I2510 Atherosclerotic heart disease of native coronary artery without angina pectoris: Secondary | ICD-10-CM

## 2017-02-25 DIAGNOSIS — I255 Ischemic cardiomyopathy: Secondary | ICD-10-CM

## 2017-02-25 DIAGNOSIS — I10 Essential (primary) hypertension: Secondary | ICD-10-CM | POA: Diagnosis not present

## 2017-02-25 DIAGNOSIS — Z720 Tobacco use: Secondary | ICD-10-CM

## 2017-02-25 DIAGNOSIS — I480 Paroxysmal atrial fibrillation: Secondary | ICD-10-CM | POA: Diagnosis not present

## 2017-02-25 LAB — BASIC METABOLIC PANEL
Anion gap: 6 (ref 5–15)
BUN: 34 mg/dL — AB (ref 6–20)
CHLORIDE: 103 mmol/L (ref 101–111)
CO2: 27 mmol/L (ref 22–32)
CREATININE: 4.06 mg/dL — AB (ref 0.61–1.24)
Calcium: 8.4 mg/dL — ABNORMAL LOW (ref 8.9–10.3)
GFR calc Af Amer: 17 mL/min — ABNORMAL LOW (ref >60)
GFR calc non Af Amer: 15 mL/min — ABNORMAL LOW (ref >60)
Glucose, Bld: 106 mg/dL — ABNORMAL HIGH (ref 65–99)
Potassium: 4.9 mmol/L (ref 3.5–5.1)
SODIUM: 136 mmol/L (ref 135–145)

## 2017-02-25 LAB — BRAIN NATRIURETIC PEPTIDE: B Natriuretic Peptide: 110 pg/mL — ABNORMAL HIGH (ref 0.0–100.0)

## 2017-02-25 MED ORDER — CARVEDILOL 25 MG PO TABS: 25.0000 mg | ORAL_TABLET | Freq: Two times a day (BID) | ORAL | 3 refills | Status: DC

## 2017-02-25 NOTE — Telephone Encounter (Signed)
Tried to call, many rings and no answer. Mailed a letter with results.

## 2017-02-25 NOTE — Patient Instructions (Addendum)
Medication Instructions:  Your physician has recommended you make the following change in your medication:  Increase Coreg to 25 mg Two Times Daily  Increase Demadex to 40 mg Daily for Two Days then resume 20 mg Daily.  Start Coumadin on today. Come to [place] office on Tues. For coumadin clinic   Labwork: Your physician recommends that you return for lab work in: Today    Testing/Procedures: NONE   Follow-Up: Your physician recommends that you schedule a follow-up appointment in: [place]    Any Other Special Instructions Will Be Listed Below (If Applicable).     If you need a refill on your cardiac medications before your next appointment, please call your pharmacy. Thank you for choosing [facility]!

## 2017-02-26 ENCOUNTER — Encounter: Payer: Self-pay | Admitting: *Deleted

## 2017-02-26 ENCOUNTER — Other Ambulatory Visit: Payer: Self-pay | Admitting: Physician Assistant

## 2017-02-26 NOTE — Telephone Encounter (Signed)
Patient states that he was told at [facility] yesterday that ML would call in his breathing medicaitions. He states he is taking Microbiologist / tg

## 2017-02-26 NOTE — Telephone Encounter (Signed)
Breo inhaler called into [facility]

## 2017-03-02 ENCOUNTER — Encounter (HOSPITAL_COMMUNITY): Payer: 59

## 2017-03-03 ENCOUNTER — Ambulatory Visit (INDEPENDENT_AMBULATORY_CARE_PROVIDER_SITE_OTHER): Payer: 59 | Admitting: *Deleted

## 2017-03-03 DIAGNOSIS — Z5181 Encounter for therapeutic drug level monitoring: Secondary | ICD-10-CM

## 2017-03-03 DIAGNOSIS — I480 Paroxysmal atrial fibrillation: Secondary | ICD-10-CM | POA: Diagnosis not present

## 2017-03-03 LAB — POCT INR: INR: 1.8

## 2017-03-06 ENCOUNTER — Other Ambulatory Visit: Payer: Self-pay | Admitting: Cardiology

## 2017-03-09 NOTE — Progress Notes (Signed)
LMOM to call.

## 2017-03-09 NOTE — Progress Notes (Signed)
Hep C negative. No need for further checks.

## 2017-03-10 NOTE — Progress Notes (Signed)
Letter mailed to pt with results

## 2017-03-17 ENCOUNTER — Other Ambulatory Visit: Payer: Self-pay

## 2017-03-17 DIAGNOSIS — I739 Peripheral vascular disease, unspecified: Secondary | ICD-10-CM

## 2017-03-27 ENCOUNTER — Ambulatory Visit: Payer: Medicaid Other | Admitting: Cardiovascular Disease

## 2017-03-27 ENCOUNTER — Encounter: Payer: Self-pay | Admitting: Cardiovascular Disease

## 2017-04-01 ENCOUNTER — Inpatient Hospital Stay (HOSPITAL_COMMUNITY): Admission: RE | Admit: 2017-04-01 | Payer: Medicaid Other | Source: Ambulatory Visit

## 2017-04-03 ENCOUNTER — Ambulatory Visit: Payer: 59 | Admitting: Cardiology

## 2017-04-04 ENCOUNTER — Other Ambulatory Visit: Payer: Self-pay | Admitting: Physician Assistant

## 2017-04-06 ENCOUNTER — Ambulatory Visit: Payer: 59 | Admitting: Gastroenterology

## 2017-04-08 ENCOUNTER — Encounter: Payer: Self-pay | Admitting: Surgery

## 2017-04-08 ENCOUNTER — Ambulatory Visit (HOSPITAL_COMMUNITY)
Admission: RE | Admit: 2017-04-08 | Discharge: 2017-04-08 | Disposition: A | Discharge status: Home / Self Care | Payer: Medicare HMO | Source: Ambulatory Visit | Attending: Surgery | Admitting: Surgery

## 2017-04-08 ENCOUNTER — Ambulatory Visit (INDEPENDENT_AMBULATORY_CARE_PROVIDER_SITE_OTHER): Payer: Medicare HMO | Admitting: Surgery

## 2017-04-08 VITALS — BP 188/90 | HR 72 | Temp 97.6°F | Resp 19 | Ht 76.0 in | Wt 213.3 lb

## 2017-04-08 DIAGNOSIS — I739 Peripheral vascular disease, unspecified: Secondary | ICD-10-CM | POA: Diagnosis present

## 2017-04-08 DIAGNOSIS — I70213 Atherosclerosis of native arteries of extremities with intermittent claudication, bilateral legs: Secondary | ICD-10-CM | POA: Diagnosis not present

## 2017-04-08 DIAGNOSIS — I70201 Unspecified atherosclerosis of native arteries of extremities, right leg: Secondary | ICD-10-CM | POA: Diagnosis not present

## 2017-04-08 NOTE — Progress Notes (Signed)
[facility]ascular and Vein Specialist of [facility]  Patient name: Aaron [name] MRN: [id number] DOB: [birth year] Sex: male   REASON FOR VISIT:    Follow up  HISOTRY OF PRESENT ILLNESS:    Aaron [name] is a 61 y.o. male who was initially seen by Dr. Kellie Simmering for left leg claudication.  He was taken for angiography on September 20, 2014 by myself.  A left superficial femoral artery occlusion was visualized.  He underwent successful recanalization and stenting using a 7 x 100 self-expanding stent.  He is now having significant lifestyle limiting symptoms with his right leg.  He cannot walk more than approximately 100 feet before he has to stop for pain in his calf.   He tells me he recently had a heart attack approximately 2 months ago.  He is complaining of pain in his right groin following the catheterization.  He is now on Coumadin for paroxysmal atrial fibrillation.  He is not taking a statin, but needs to be on based of his dyslipidemia.  He has chronic stage IV renal insufficiency, followed by nephrology.   PAST MEDICAL HISTORY:   Past Medical History:  Diagnosis Date  . Anxiety   . Arthritis    knees , back , shoulders  . CAD (coronary artery disease)    Mild nonobstructive disease at cardiac catheterization 2002  . Chronic back pain   . CKD (chronic kidney disease), stage II   . COPD (chronic obstructive pulmonary disease) ([place])   . Diverticulitis   . Esophageal reflux   . Essential hypertension   . Hepatitis C   . History of syncope   . Hyperlipidemia   . Migraines   . Nephrolithiasis   . PAT (paroxysmal atrial tachycardia) ([place])   . Peripheral arterial disease (HCC)    Occluded left superficial femoral artery status post stent June 2016 - Dr. Trula Slade  . Pneumonia    as child   64 years old  . Polycystic kidney, unspecified type   . Syncope 09/2014     FAMILY HISTORY:   Family History  Problem Relation Age of Onset  . Alcoholism Mother    . Heart disease Father        Massive heart attack  . Heart attack Father   . Atrial fibrillation Father   . Colon cancer Father   . Colon cancer Maternal Grandfather 54  . Alcoholism Maternal Grandfather   . Renal cancer Cousin   . Ovarian cancer Sister     SOCIAL HISTORY:   Social History   Tobacco Use  . Smoking status: Former Smoker    Packs/day: 0.50    Years: 47.00    Pack years: 23.50    Types: Cigarettes  . Smokeless tobacco: Former Systems developer    Types: Snuff, Chew  Substance Use Topics  . Alcohol use: No    Alcohol/week: 0.0 oz     ALLERGIES:   No Known Allergies   CURRENT MEDICATIONS:   Current Outpatient Medications  Medication Sig Dispense Refill  . carvedilol (COREG) 25 MG tablet Take 1 tablet (25 mg total) 2 (two) times daily by mouth. 180 tablet 3  . doxazosin (CARDURA) 8 MG tablet Take 8 mg by mouth daily.    . hydrOXYzine (ATARAX/VISTARIL) 25 MG tablet TAKE 1 TABLET THREE TIMES DAILY AS NEEDED    . oxyCODONE-acetaminophen (PERCOCET) 10-325 MG per tablet Take 1 tablet by mouth every 3 (three) hours.     . pantoprazole (PROTONIX) 40 MG tablet Take  40 mg daily by mouth.  1  . sodium bicarbonate 650 MG tablet Take 1 tablet (650 mg total) by mouth 3 (three) times daily. 120 tablet 1  . torsemide (DEMADEX) 20 MG tablet Take 1 tablet (20 mg total) by mouth daily. 30 tablet 0  . warfarin (COUMADIN) 5 MG tablet Take 1/2 to 1 tablet by mouth daily as directed by coumadin clinic 30 tablet 1   No current facility-administered medications for this visit.     REVIEW OF SYSTEMS:   [X]  denotes positive finding, [ ]  denotes negative finding Cardiac  Comments:  Chest pain or chest pressure:    Shortness of breath upon exertion:    Short of breath when lying flat:    Irregular heart rhythm:        Vascular    Pain in calf, thigh, or hip brought on by ambulation: x   Pain in feet at night that wakes you up from your sleep:     Blood clot in your veins:    Leg  swelling:         Pulmonary    Oxygen at home:    Productive cough:     Wheezing:         Neurologic    Sudden weakness in arms or legs:     Sudden numbness in arms or legs:     Sudden onset of difficulty speaking or slurred speech:    Temporary loss of vision in one eye:     Problems with dizziness:         Gastrointestinal    Blood in stool:     Vomited blood:         Genitourinary    Burning when urinating:     Blood in urine:        Psychiatric    Major depression:         Hematologic    Bleeding problems:    Problems with blood clotting too easily:        Skin    Rashes or ulcers:        Constitutional    Fever or chills:      PHYSICAL EXAM:   Vitals:   04/08/17 1202 04/08/17 1204  BP: (!) 176/95 (!) 188/90  Pulse: 72   Resp: 19   Temp: 97.6 F (36.4 C)   TempSrc: Oral   SpO2: 99%   Weight: 213 lb 4.8 oz (96.8 kg)   Height: 6\' 4"  (1.93 m)     GENERAL: The patient is a well-nourished male, in no acute distress. The vital signs are documented above. CARDIAC: There is a regular rate and rhythm.  VASCULAR: Nonpalpable pedal pulses PULMONARY: Non-labored respirations ABDOMEN: Soft and non-tender with normal pitched bowel sounds.  MUSCULOSKELETAL: There are no major deformities or cyanosis. NEUROLOGIC: No focal weakness or paresthesias are detected. SKIN: There are no ulcers or rashes noted. PSYCHIATRIC: The patient has a normal affect.  STUDIES:   ABIs were ordered and reviewed today.  ABI on the left is 0.96 with triphasic waveforms.  On the right is 0.56 with monophasic waveforms  MEDICAL ISSUES:   PAD: The patient cannot tolerate the level of disability in the right leg.  I have discussed proceeding with angiography and intervention.  We will entertain enrolling him in the TRANSCEND study.  He understands the risks and benefits of intervention.  I scheduled this for Thursday, January 3  I will pay close attention to the cannulation  site in the  right groin as the patient is having discomfort in this area.  I could not get an ultrasound today.  [name], MD [facility] Specialists of [facility] [phone] Pager [phone]

## 2017-04-09 ENCOUNTER — Other Ambulatory Visit: Payer: Self-pay | Admitting: *Deleted

## 2017-04-10 ENCOUNTER — Ambulatory Visit: Payer: Medicaid Other | Admitting: Cardiology

## 2017-04-10 ENCOUNTER — Encounter: Payer: Self-pay | Admitting: Cardiology

## 2017-04-10 NOTE — Progress Notes (Deleted)
Clinical Summary [name] is a 61 y.o.male  1. Afib - ongoing issues with high heart rates, at 02/25/17 appt with PA Lenze coreg increased to 25mg  bid  2. CAD - mild CAD by cath in 2002. 80% small diag disease - 01/2017 nuclear stress inferior infarct without ischemia. LVEF 36%, intermediate risk based on LVEF  3. PAD - history of occldued left SFA s/p stent 09/2014 - plans for angio by vascular  4. Chronic systolic HF  - 18/2993 echo LVEF 40-45% - no ACE/ARB/aldactone due to poor renal function. Could consider hydral/nitrates  5. Hyperlipidemia  6. 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   7. Wheezing ?COPD Past Medical History:  Diagnosis Date  . Anxiety   . Arthritis    knees , back , shoulders  . CAD (coronary artery disease)    Mild nonobstructive disease at cardiac catheterization 2002  . Chronic back pain   . CKD (chronic kidney disease), stage II   . COPD (chronic obstructive pulmonary disease) ([place])   . Diverticulitis   . Esophageal reflux   . Essential hypertension   . Hepatitis C   . History of syncope   . Hyperlipidemia   . Migraines   . Nephrolithiasis   . PAT (paroxysmal atrial tachycardia) ([facility])   . Peripheral arterial disease ([facility])    Occluded left superficial femoral artery status post stent June 2016 - Dr. Trula Slade  . Pneumonia    as child   82 years old  . Polycystic kidney, unspecified type   . Syncope 09/2014     No Known Allergies   Current Outpatient Medications  Medication Sig Dispense Refill  . carvedilol (COREG) 25 MG tablet Take 1 tablet (25 mg total) 2 (two) times daily by mouth. 180 tablet 3  . doxazosin (CARDURA) 8 MG tablet Take 8 mg by mouth daily.    . hydrOXYzine (ATARAX/VISTARIL) 25 MG tablet TAKE 1 TABLET THREE TIMES DAILY AS NEEDED    . oxyCODONE-acetaminophen (PERCOCET) 10-325 MG per tablet Take 1 tablet by mouth every 3 (three) hours.     . pantoprazole (PROTONIX) 40 MG tablet Take 40 mg daily by mouth.  1  .  sodium bicarbonate 650 MG tablet Take 1 tablet (650 mg total) by mouth 3 (three) times daily. 120 tablet 1  . torsemide (DEMADEX) 20 MG tablet Take 1 tablet (20 mg total) by mouth daily. 30 tablet 0  . warfarin (COUMADIN) 5 MG tablet Take 1/2 to 1 tablet by mouth daily as directed by coumadin clinic 30 tablet 1   No current facility-administered medications for this visit.      Past Surgical History:  Procedure Laterality Date  . BACK SURGERY    . BIOPSY  12/17/2016   Procedure: BIOPSY;  Surgeon: Daneil Dolin, MD;  Location: [facility];  Service: Gastroenterology;;  gastric colon  . BUNIONECTOMY    . CERVICAL SPINE SURGERY    . COLONOSCOPY  2008   Dr. Oneida Alar: rare sigmoid colon diverticulosis, internal hemorrhoids.   . COLONOSCOPY WITH PROPOFOL N/A 12/17/2016   dense left-sided diverticulosis, right colon ulcers s/p biopsy query occult NSAID use vs transient ischemia, not consistent with IBD. CMV stains negative.   . ESOPHAGEAL DILATION  12/17/2016   EGD with mild Schatzki's ring s/p dilatation, small hiatal hernia, erosive gastropathy (negative H.pylori gastritis)  . ESOPHAGOGASTRODUODENOSCOPY  2008   Dr. Oneida Alar: normal esophagus without Barrett's, antritis and duodenitis, path with H.pylori gastritis  . ESOPHAGOGASTRODUODENOSCOPY (  EGD) WITH PROPOFOL N/A 12/17/2016   Procedure: ESOPHAGOGASTRODUODENOSCOPY (EGD) WITH PROPOFOL;  Surgeon: Daneil Dolin, MD;  Location: [facility];  Service: Gastroenterology;  Laterality: N/A;  . HERNIA REPAIR     umbilical  . PERIPHERAL VASCULAR CATHETERIZATION N/A 09/20/2014   Procedure: Abdominal Aortogram;  Surgeon: Serafina Mitchell, MD;  Location: [facility];  Service: Cardiovascular;  Laterality: N/A;     No Known Allergies    Family History  Problem Relation Age of Onset  . Alcoholism Mother   . Heart disease Father        Massive heart attack  . Heart attack Father   . Atrial fibrillation Father   . Colon cancer Father   .  Colon cancer Maternal Grandfather 8  . Alcoholism Maternal Grandfather   . Renal cancer Cousin   . Ovarian cancer Sister      Social History Mr. Tinoco reports that he has quit smoking. His smoking use included cigarettes. He has a 23.50 pack-year smoking history. He has quit using smokeless tobacco. His smokeless tobacco use included snuff and chew. Mr. Mincey reports that he does not drink alcohol.   Review of Systems CONSTITUTIONAL: No weight loss, fever, chills, weakness or fatigue.  HEENT: Eyes: No visual loss, blurred vision, double vision or yellow sclerae.No hearing loss, sneezing, congestion, runny nose or sore throat.  SKIN: No rash or itching.  CARDIOVASCULAR:  RESPIRATORY: No shortness of breath, cough or sputum.  GASTROINTESTINAL: No anorexia, nausea, vomiting or diarrhea. No abdominal pain or blood.  GENITOURINARY: No burning on urination, no polyuria NEUROLOGICAL: No headache, dizziness, syncope, paralysis, ataxia, numbness or tingling in the extremities. No change in bowel or bladder control.  MUSCULOSKELETAL: No muscle, back pain, joint pain or stiffness.  LYMPHATICS: No enlarged nodes. No history of splenectomy.  PSYCHIATRIC: No history of depression or anxiety.  ENDOCRINOLOGIC: No reports of sweating, cold or heat intolerance. No polyuria or polydipsia.  Aaron [name]   Physical Examination There were no vitals filed for this visit. There were no vitals filed for this visit.  Gen: resting comfortably, no acute distress HEENT: no scleral icterus, pupils equal round and reactive, no palptable cervical adenopathy,  CV Resp: Clear to auscultation bilaterally GI: abdomen is soft, non-tender, non-distended, normal bowel sounds, no hepatosplenomegaly MSK: extremities are warm, no edema.  Skin: warm, no rash Neuro:  no focal deficits Psych: appropriate affect   Diagnostic Studies 01/2017 echo Aaron [name] - LVEF 51-83%, grade I diastolic dysfunction      Assessment and Plan         Arnoldo Lenis, M.D., F.A.C.C.

## 2017-04-13 ENCOUNTER — Telehealth: Payer: Self-pay | Admitting: *Deleted

## 2017-04-13 NOTE — Telephone Encounter (Signed)
Contacted pt re: his upcoming surgery, 04/21/17. I spoke with pt and have instructed him to hold his Couamdin 5 days prior to surgery, his last dose will be 04/15/17, and he has been advised to continue upon the recommendations of the surgeon, Dr. Trula Slade.  Pt also advised that if for any reason his procedure is put off, to continue the Coumadin and to call our office with the new appt.  Pt verbalized understanding and thanked me for the call.

## 2017-04-13 NOTE — Telephone Encounter (Addendum)
Pt takes Coumadin for afib with CHADS2VASc score of 3 (HTN, CHF, CAD). Ok to hold Coumadin for 5 days prior to procedure.

## 2017-04-13 NOTE — Telephone Encounter (Signed)
   Chart reviewed as part of pre-operative protocol coverage.   We have received anticoagulation question for lower extremity angiography scheduled for 04/21/17. I will route to pharmacy team for assistance to help coordinate Coumadin. Also of note, patient's Cr baseline recently is 4-5. Cardiology PA notes in 02/2017 indicate patient is not on dialysis. I do not see this specifically mentioned in vascular notes so will also CC Dr. Trula Slade on this note to make sure he is aware.  Charlie Pitter, PA-C 04/13/2017, 3:09 PM

## 2017-04-13 NOTE — Telephone Encounter (Signed)
   [facility] Pre-operative Risk Assessment    Request for surgical clearance:  1. What type of surgery is being performed? Aortogram   2. When is this surgery scheduled? 04/21/17   3. Are there any medications that need to be held prior to surgery and how long?Coumadin x 5 days, if coverage needed please instruct patient   4. Practice name and name of physician performing surgery? [facility] Dr. Trula Slade   5. What is your office phone and fax number? [phone]   6. Anesthesia type (None, local, MAC, general) ?    Nixxon Faria A Miasia Crabtree 04/13/2017, 2:02 PM  _________________________________________________________________   (provider comments below)

## 2017-04-13 NOTE — Telephone Encounter (Signed)
   Chart reviewed as part of pre-operative protocol coverage. Per pharmacist recommendation, "Ok to hold Coumadin for 5 days prior to procedure." [facility] callback staff, I cannot tell from intake request if it is implied for our office to let him know this. Can you call the patient to make him aware of this recommendation? Tell him that if his surgery date is adjusted in any way, he will need to touch base with our office about adjusting the Coumadin as well.  Will route this bundled recommendation to requesting provider via Epic fax function. As per note below, also routed to Dr. Trula Slade to make sure he is aware of patient's baseline renal dysfunction.  Please call with questions.  Charlie Pitter, PA-C 04/13/2017, 4:32 PM

## 2017-04-15 ENCOUNTER — Telehealth: Payer: Self-pay | Admitting: *Deleted

## 2017-04-15 ENCOUNTER — Ambulatory Visit (HOSPITAL_COMMUNITY)
Admission: RE | Admit: 2017-04-15 | Payer: Medicaid Other | Source: Ambulatory Visit | Attending: Gastroenterology | Admitting: Gastroenterology

## 2017-04-15 NOTE — Telephone Encounter (Signed)
Call to patient instructed to hold Coumadin 5 days prior to procedure. Last dose 04/15/17. To Be at [facility] admitting department at 8:30 am on 04/21/17 for Aortogram. NPO past MN night prior except may take any heart or blood pressure medications with sips of water am of test. Hold others until after this test. Must have a driver home and someone overnight. Verbalizes understanding. To call back if any questions.

## 2017-04-16 ENCOUNTER — Telehealth: Payer: Self-pay | Admitting: Physician Assistant

## 2017-04-16 NOTE — Telephone Encounter (Signed)
Please give pt a call  °

## 2017-04-16 NOTE — Telephone Encounter (Signed)
Returned pt call, he stated that he needs [place] to fax a prescription for oxygen to his pharmacy. He has never been on oxygen before. I advised him to contact his pcp. He stated he could not find a pcp, so I gave him a list of pcp's over the phone. He states he will start calling them this afternoon. I did tell him if he had no luck to call back and we would make an appointment with a PA to discuss his issues.

## 2017-04-21 ENCOUNTER — Ambulatory Visit (HOSPITAL_COMMUNITY): Admission: RE | Admit: 2017-04-21 | Payer: Medicaid Other | Source: Ambulatory Visit | Admitting: Surgery

## 2017-04-21 ENCOUNTER — Encounter (HOSPITAL_COMMUNITY): Admission: RE | Payer: Self-pay | Source: Ambulatory Visit

## 2017-04-21 SURGERY — ABDOMINAL AORTOGRAM W/LOWER EXTREMITY
Anesthesia: LOCAL

## 2017-04-27 ENCOUNTER — Emergency Department (HOSPITAL_COMMUNITY): Payer: Medicare HMO

## 2017-04-27 ENCOUNTER — Other Ambulatory Visit: Payer: Self-pay

## 2017-04-27 ENCOUNTER — Telehealth: Payer: Self-pay | Admitting: Gastroenterology

## 2017-04-27 ENCOUNTER — Inpatient Hospital Stay (HOSPITAL_COMMUNITY)
Admission: EM | Admit: 2017-04-27 | Discharge: 2017-04-30 | DRG: 392 | Disposition: A | Discharge status: Home / Self Care | Payer: Medicare HMO | Attending: Internal Medicine | Admitting: Internal Medicine

## 2017-04-27 ENCOUNTER — Encounter (HOSPITAL_COMMUNITY): Payer: Self-pay | Admitting: Emergency Medicine

## 2017-04-27 DIAGNOSIS — I10 Essential (primary) hypertension: Secondary | ICD-10-CM | POA: Diagnosis not present

## 2017-04-27 DIAGNOSIS — K559 Vascular disorder of intestine, unspecified: Secondary | ICD-10-CM | POA: Diagnosis present

## 2017-04-27 DIAGNOSIS — K319 Disease of stomach and duodenum, unspecified: Secondary | ICD-10-CM | POA: Diagnosis present

## 2017-04-27 DIAGNOSIS — K2980 Duodenitis without bleeding: Secondary | ICD-10-CM | POA: Diagnosis present

## 2017-04-27 DIAGNOSIS — E872 Acidosis: Secondary | ICD-10-CM | POA: Diagnosis present

## 2017-04-27 DIAGNOSIS — Z8249 Family history of ischemic heart disease and other diseases of the circulatory system: Secondary | ICD-10-CM

## 2017-04-27 DIAGNOSIS — M1990 Unspecified osteoarthritis, unspecified site: Secondary | ICD-10-CM | POA: Diagnosis present

## 2017-04-27 DIAGNOSIS — G8929 Other chronic pain: Secondary | ICD-10-CM | POA: Diagnosis present

## 2017-04-27 DIAGNOSIS — I480 Paroxysmal atrial fibrillation: Secondary | ICD-10-CM | POA: Diagnosis present

## 2017-04-27 DIAGNOSIS — K219 Gastro-esophageal reflux disease without esophagitis: Secondary | ICD-10-CM | POA: Diagnosis present

## 2017-04-27 DIAGNOSIS — Z7901 Long term (current) use of anticoagulants: Secondary | ICD-10-CM

## 2017-04-27 DIAGNOSIS — R933 Abnormal findings on diagnostic imaging of other parts of digestive tract: Secondary | ICD-10-CM | POA: Diagnosis not present

## 2017-04-27 DIAGNOSIS — R1013 Epigastric pain: Secondary | ICD-10-CM | POA: Diagnosis present

## 2017-04-27 DIAGNOSIS — I739 Peripheral vascular disease, unspecified: Secondary | ICD-10-CM | POA: Diagnosis present

## 2017-04-27 DIAGNOSIS — I120 Hypertensive chronic kidney disease with stage 5 chronic kidney disease or end stage renal disease: Secondary | ICD-10-CM | POA: Diagnosis present

## 2017-04-27 DIAGNOSIS — Z87442 Personal history of urinary calculi: Secondary | ICD-10-CM | POA: Diagnosis not present

## 2017-04-27 DIAGNOSIS — I255 Ischemic cardiomyopathy: Secondary | ICD-10-CM | POA: Diagnosis present

## 2017-04-27 DIAGNOSIS — N185 Chronic kidney disease, stage 5: Secondary | ICD-10-CM | POA: Diagnosis present

## 2017-04-27 DIAGNOSIS — E785 Hyperlipidemia, unspecified: Secondary | ICD-10-CM | POA: Diagnosis present

## 2017-04-27 DIAGNOSIS — K449 Diaphragmatic hernia without obstruction or gangrene: Secondary | ICD-10-CM | POA: Diagnosis present

## 2017-04-27 DIAGNOSIS — B1920 Unspecified viral hepatitis C without hepatic coma: Secondary | ICD-10-CM | POA: Diagnosis present

## 2017-04-27 DIAGNOSIS — K3189 Other diseases of stomach and duodenum: Secondary | ICD-10-CM | POA: Diagnosis not present

## 2017-04-27 DIAGNOSIS — J449 Chronic obstructive pulmonary disease, unspecified: Secondary | ICD-10-CM | POA: Diagnosis present

## 2017-04-27 DIAGNOSIS — Z87891 Personal history of nicotine dependence: Secondary | ICD-10-CM

## 2017-04-27 DIAGNOSIS — Z95820 Peripheral vascular angioplasty status with implants and grafts: Secondary | ICD-10-CM | POA: Diagnosis not present

## 2017-04-27 DIAGNOSIS — I471 Supraventricular tachycardia: Secondary | ICD-10-CM | POA: Diagnosis present

## 2017-04-27 DIAGNOSIS — E860 Dehydration: Secondary | ICD-10-CM | POA: Diagnosis present

## 2017-04-27 DIAGNOSIS — Z8619 Personal history of other infectious and parasitic diseases: Secondary | ICD-10-CM | POA: Diagnosis present

## 2017-04-27 DIAGNOSIS — R1114 Bilious vomiting: Secondary | ICD-10-CM

## 2017-04-27 DIAGNOSIS — N179 Acute kidney failure, unspecified: Secondary | ICD-10-CM | POA: Diagnosis present

## 2017-04-27 DIAGNOSIS — I2510 Atherosclerotic heart disease of native coronary artery without angina pectoris: Secondary | ICD-10-CM

## 2017-04-27 DIAGNOSIS — Z79899 Other long term (current) drug therapy: Secondary | ICD-10-CM

## 2017-04-27 DIAGNOSIS — K222 Esophageal obstruction: Secondary | ICD-10-CM | POA: Diagnosis present

## 2017-04-27 DIAGNOSIS — K269 Duodenal ulcer, unspecified as acute or chronic, without hemorrhage or perforation: Secondary | ICD-10-CM | POA: Diagnosis present

## 2017-04-27 LAB — PROTIME-INR
INR: 0.99
PROTHROMBIN TIME: 13 s (ref 11.4–15.2)

## 2017-04-27 LAB — COMPREHENSIVE METABOLIC PANEL
ALT: 6 U/L — AB (ref 17–63)
ANION GAP: 11 (ref 5–15)
AST: 10 U/L — ABNORMAL LOW (ref 15–41)
Albumin: 3.5 g/dL (ref 3.5–5.0)
Alkaline Phosphatase: 88 U/L (ref 38–126)
BUN: 42 mg/dL — ABNORMAL HIGH (ref 6–20)
CO2: 22 mmol/L (ref 22–32)
CREATININE: 5.2 mg/dL — AB (ref 0.61–1.24)
Calcium: 8.2 mg/dL — ABNORMAL LOW (ref 8.9–10.3)
Chloride: 105 mmol/L (ref 101–111)
GFR calc non Af Amer: 11 mL/min — ABNORMAL LOW (ref >60)
GFR, EST AFRICAN AMERICAN: 13 mL/min — AB (ref >60)
Glucose, Bld: 104 mg/dL — ABNORMAL HIGH (ref 65–99)
Potassium: 4.9 mmol/L (ref 3.5–5.1)
Sodium: 138 mmol/L (ref 135–145)
Total Bilirubin: 0.8 mg/dL (ref 0.3–1.2)
Total Protein: 7 g/dL (ref 6.5–8.1)

## 2017-04-27 LAB — URINALYSIS, ROUTINE W REFLEX MICROSCOPIC
BILIRUBIN URINE: NEGATIVE
Bacteria, UA: NONE SEEN
GLUCOSE, UA: NEGATIVE mg/dL
Ketones, ur: NEGATIVE mg/dL
Leukocytes, UA: NEGATIVE
NITRITE: NEGATIVE
PROTEIN: 100 mg/dL — AB
Specific Gravity, Urine: 1 — ABNORMAL LOW (ref 1.005–1.030)
pH: 6 (ref 5.0–8.0)

## 2017-04-27 LAB — CBC
HCT: 35.2 % — ABNORMAL LOW (ref 39.0–52.0)
Hemoglobin: 11.5 g/dL — ABNORMAL LOW (ref 13.0–17.0)
MCH: 30.2 pg (ref 26.0–34.0)
MCHC: 32.7 g/dL (ref 30.0–36.0)
MCV: 92.4 fL (ref 78.0–100.0)
PLATELETS: 216 10*3/uL (ref 150–400)
RBC: 3.81 MIL/uL — AB (ref 4.22–5.81)
RDW: 15.3 % (ref 11.5–15.5)
WBC: 8 10*3/uL (ref 4.0–10.5)

## 2017-04-27 LAB — LIPASE, BLOOD: Lipase: 40 U/L (ref 11–51)

## 2017-04-27 MED ORDER — PANTOPRAZOLE SODIUM 40 MG IV SOLR
INTRAVENOUS | Status: AC
  Filled 2017-04-27: qty 160

## 2017-04-27 MED ORDER — ACETAMINOPHEN 650 MG RE SUPP: 650.0000 mg | Freq: Four times a day (QID) | RECTAL | Status: DC | PRN

## 2017-04-27 MED ORDER — MORPHINE SULFATE (PF) 4 MG/ML IV SOLN
4.0000 mg | INTRAVENOUS | Status: DC | PRN
  Administered 2017-04-27 (×2): 4 mg via INTRAVENOUS
  Filled 2017-04-27 (×2): qty 1

## 2017-04-27 MED ORDER — SODIUM CHLORIDE 0.9 % IV SOLN
INTRAVENOUS | Status: DC
  Administered 2017-04-27 – 2017-04-28 (×2): via INTRAVENOUS

## 2017-04-27 MED ORDER — ONDANSETRON HCL 4 MG/2ML IJ SOLN
4.0000 mg | Freq: Once | INTRAMUSCULAR | Status: AC
  Administered 2017-04-27: 4 mg via INTRAVENOUS
  Filled 2017-04-27: qty 2

## 2017-04-27 MED ORDER — ONDANSETRON HCL 4 MG PO TABS
4.0000 mg | ORAL_TABLET | Freq: Four times a day (QID) | ORAL | Status: DC | PRN
  Administered 2017-04-30: 4 mg via ORAL
  Filled 2017-04-27: qty 1

## 2017-04-27 MED ORDER — SODIUM CHLORIDE 0.9 % IV SOLN
80.0000 mg | Freq: Once | INTRAVENOUS | Status: AC
  Administered 2017-04-27: 19:00:00 80 mg via INTRAVENOUS
  Filled 2017-04-27: qty 80

## 2017-04-27 MED ORDER — METOPROLOL TARTRATE 5 MG/5ML IV SOLN
2.5000 mg | Freq: Three times a day (TID) | INTRAVENOUS | Status: DC
  Administered 2017-04-27 – 2017-04-28 (×3): 2.5 mg via INTRAVENOUS
  Filled 2017-04-27 (×3): qty 5

## 2017-04-27 MED ORDER — SODIUM CHLORIDE 0.9 % IV BOLUS (SEPSIS)
1000.0000 mL | Freq: Once | INTRAVENOUS | Status: AC
Start: 2017-04-27 — End: 2017-04-27
  Administered 2017-04-27: 1000 mL via INTRAVENOUS

## 2017-04-27 MED ORDER — SODIUM CHLORIDE 0.9 % IV SOLN
8.0000 mg/h | INTRAVENOUS | Status: DC
  Administered 2017-04-27 – 2017-04-28 (×2): 8 mg/h via INTRAVENOUS
  Filled 2017-04-27 (×5): qty 80

## 2017-04-27 MED ORDER — ONDANSETRON HCL 4 MG/2ML IJ SOLN
4.0000 mg | Freq: Four times a day (QID) | INTRAMUSCULAR | Status: DC | PRN
  Administered 2017-04-28 – 2017-04-29 (×2): 4 mg via INTRAVENOUS
  Filled 2017-04-27 (×2): qty 2

## 2017-04-27 MED ORDER — SODIUM CHLORIDE 0.9 % IV SOLN
Freq: Once | INTRAVENOUS | Status: AC
  Administered 2017-04-27: 19:00:00 via INTRAVENOUS

## 2017-04-27 MED ORDER — PANTOPRAZOLE SODIUM 40 MG IV SOLR: 40.0000 mg | Freq: Two times a day (BID) | INTRAVENOUS | Status: DC

## 2017-04-27 MED ORDER — ACETAMINOPHEN 325 MG PO TABS: 650.0000 mg | ORAL_TABLET | Freq: Four times a day (QID) | ORAL | Status: DC | PRN

## 2017-04-27 MED ORDER — HYDROMORPHONE HCL 1 MG/ML IJ SOLN
1.0000 mg | INTRAMUSCULAR | Status: DC | PRN
  Administered 2017-04-27 – 2017-04-28 (×4): 1 mg via INTRAVENOUS
  Filled 2017-04-27 (×4): qty 1

## 2017-04-27 MED ORDER — METOPROLOL TARTRATE 5 MG/5ML IV SOLN: 2.5000 mg | Freq: Three times a day (TID) | INTRAVENOUS | Status: DC

## 2017-04-27 MED ORDER — PANTOPRAZOLE SODIUM 40 MG IV SOLR
40.0000 mg | Freq: Once | INTRAVENOUS | Status: AC
  Administered 2017-04-27: 40 mg via INTRAVENOUS
  Filled 2017-04-27: qty 40

## 2017-04-27 NOTE — Telephone Encounter (Signed)
PT said he has not been able to keep food or liquids down for 3 days.  He feels a lot of pressure in his mid abdomen and feels like he has a know in the stomach. I told him since he cannot keep anything down he should go to the ED to be evaluated. He said he will go now.

## 2017-04-27 NOTE — ED Notes (Signed)
Return from CT

## 2017-04-27 NOTE — Telephone Encounter (Signed)
[phone] PLEASE CALL PATIENT, VOMITING AND UNABLE TO KEEP ANYTHING DOWN

## 2017-04-27 NOTE — ED Triage Notes (Signed)
Pt c/o ruq pain x 4 days with n/v. States cant keep liquids down. Mm wet. Hx of hernia. abd distneded and mildy firm. Last nbm 2 days ago.

## 2017-04-27 NOTE — Telephone Encounter (Signed)
REVIEWED-NO ADDITIONAL RECOMMENDATIONS. 

## 2017-04-27 NOTE — ED Provider Notes (Signed)
[facility] EMERGENCY DEPARTMENT Provider Note   CSN: [id number] Arrival date & time: 04/27/17  1152     History   Chief Complaint Chief Complaint  Patient presents with  . Abdominal Pain    HPI Aaron [name] is a 62 y.o. male.  Chief complaint is abdominal bloating, and vomiting.  HPI: 61 year old male.  History of polycystic kidney disease and chronic renal insufficiency.  Episode of rectal bleeding last fall.  September had normal upper GI other than esophageal Totzke's ring, and single gastric erosion, normal duodenum.  Eating thought to be secondary to colon erosion from NSAID use.  No history of gallbladder disease.  He describes bloating and postprandial discomfort in his epigastrium for the last 2-3 days.  He has been vomiting for 2 days and unable to keep down food or fluids.  Vomiting relieves the symptoms but then they tend to recur.  Feeling somewhat weakened.  He contacted his GI physician, and was referred to the emergency room.  Describes "green" or bilious emesis.  No blood.  Had a laparoscopically assisted right inguinal hernia repair in November that was uncomplicated.  Past Medical History:  Diagnosis Date  . Anxiety   . Arthritis    knees , back , shoulders  . CAD (coronary artery disease)    Mild nonobstructive disease at cardiac catheterization 2002  . Chronic back pain   . CKD (chronic kidney disease), stage II   . COPD (chronic obstructive pulmonary disease) ([place])   . Diverticulitis   . Esophageal reflux   . Essential hypertension   . Hepatitis C   . History of syncope   . Hyperlipidemia   . Migraines   . Nephrolithiasis   . PAT (paroxysmal atrial tachycardia) ([place])   . Peripheral arterial disease (HCC)    Occluded left superficial femoral artery status post stent June 2016 - Dr. Trula Slade  . Pneumonia    as child   69 years old  . Polycystic kidney, unspecified type   . Syncope 09/2014    Patient Active Problem List   Diagnosis Date Noted   . Encounter for therapeutic drug monitoring 03/03/2017  . Abdominal pain 02/20/2017  . History of hepatitis C 02/20/2017  . Ischemic cardiomyopathy 01/27/2017  . Dyslipidemia 01/27/2017  . Paroxysmal atrial fibrillation ([place]) 01/27/2017  . Preoperative clearance 01/21/2017  . CAD (coronary artery disease) 01/21/2017  . Pain of upper abdomen   . Acute renal failure (ARF) ([place]) 12/16/2016  . Rectal bleeding   . AKI (acute kidney injury) ([place]) 12/15/2016  . CKD (chronic kidney disease) 12/15/2016  . Hepatitis C 12/15/2016  . Polycystic kidney disease 12/14/2016  . Jerking 09/23/2014  . Syncope 09/23/2014  . COPD (chronic obstructive pulmonary disease) ([place])   . COLD (chronic obstructive lung disease) ([place])   . Depression   . PAD (peripheral artery disease) ([place]) 09/19/2014  . Preop cardiovascular exam 10/28/2010  . Tobacco abuse 10/28/2010  . SYNCOPE 05/07/2010  . UNSPECIFIED IRON DEFICIENCY ANEMIA 10/04/2009  . 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  . Essential hypertension 10/04/2009  . Esophageal reflux 10/04/2009  . PRECORDIAL PAIN 10/04/2009    Past Surgical History:  Procedure Laterality Date  . BACK SURGERY    . BIOPSY  12/17/2016   Procedure: BIOPSY;  Surgeon: Daneil Dolin, MD;  Location: [facility];  Service: Gastroenterology;;  gastric colon  . BUNIONECTOMY    . CERVICAL SPINE SURGERY    . COLONOSCOPY  2008   Dr. Oneida Alar: rare sigmoid colon  diverticulosis, internal hemorrhoids.   . COLONOSCOPY WITH PROPOFOL N/A 12/17/2016   dense left-sided diverticulosis, right colon ulcers s/p biopsy query occult NSAID use vs transient ischemia, not consistent with IBD. CMV stains negative.   . ESOPHAGEAL DILATION  12/17/2016   EGD with mild Schatzki's ring s/p dilatation, small hiatal hernia, erosive gastropathy (negative H.pylori gastritis)  . ESOPHAGOGASTRODUODENOSCOPY  2008   Dr. Oneida Alar: normal esophagus without Barrett's, antritis and duodenitis, path with H.pylori gastritis  .  ESOPHAGOGASTRODUODENOSCOPY (EGD) WITH PROPOFOL N/A 12/17/2016   Procedure: ESOPHAGOGASTRODUODENOSCOPY (EGD) WITH PROPOFOL;  Surgeon: Daneil Dolin, MD;  Location: [facility];  Service: Gastroenterology;  Laterality: N/A;  . HERNIA REPAIR     umbilical  . PERIPHERAL VASCULAR CATHETERIZATION N/A 09/20/2014   Procedure: Abdominal Aortogram;  Surgeon: Serafina Mitchell, MD;  Location: [place] [facility];  Service: Cardiovascular;  Laterality: N/A;       Home Medications    Prior to Admission medications   Medication Sig Start Date End Date Taking? Authorizing Provider  carvedilol (COREG) 25 MG tablet Take 1 tablet (25 mg total) 2 (two) times daily by mouth. 02/25/17 05/26/17  Imogene Burn, PA-C  doxazosin (CARDURA) 8 MG tablet Take 8 mg by mouth at bedtime.     [provider]  guaiFENesin (MUCINEX) 600 MG 12 hr tablet Take 600 mg by mouth daily as needed (congestion).    [provider]  hydrOXYzine (ATARAX/VISTARIL) 25 MG tablet Take 25 mgs by mouth once daily 11/28/16   [provider]  oxyCODONE-acetaminophen (PERCOCET) 10-325 MG per tablet Take 1 tablet by mouth every 4 (four) hours as needed for pain.     [provider]  pantoprazole (PROTONIX) 40 MG tablet Take 40 mg daily by mouth. 01/26/17   [provider]  sodium bicarbonate 650 MG tablet Take 1 tablet (650 mg total) by mouth 3 (three) times daily. 12/18/16   Reyne Dumas, MD  torsemide (DEMADEX) 20 MG tablet Take 1 tablet (20 mg total) by mouth daily. 12/26/16   Reyne Dumas, MD  warfarin (COUMADIN) 5 MG tablet Take 1/2 to 1 tablet by mouth daily as directed by coumadin clinic Patient taking differently: Take 5 mg by mouth every evening.  03/07/17   Minus Breeding, MD    Family History Family History  Problem Relation Age of Onset  . Alcoholism Mother   . Heart disease Father        Massive heart attack  . Heart attack Father   . Atrial fibrillation Father   . Colon cancer  Father   . Colon cancer Maternal Grandfather 56  . Alcoholism Maternal Grandfather   . Renal cancer Cousin   . Ovarian cancer Sister     Social History Social History   Tobacco Use  . Smoking status: Former Smoker    Packs/day: 0.50    Years: 47.00    Pack years: 23.50    Types: Cigarettes  . Smokeless tobacco: Former Systems developer    Types: Snuff, Chew  Substance Use Topics  . Alcohol use: No    Alcohol/week: 0.0 oz  . Drug use: No    Comment: states in remote past he used "whatever was around"     Allergies   Patient has no known allergies.   Review of Systems Review of Systems  Constitutional: Negative for appetite change, chills, diaphoresis, fatigue and fever.  HENT: Negative for mouth sores, sore throat and trouble swallowing.   Eyes: Negative for visual disturbance.  Respiratory: Negative for cough, chest tightness, shortness of breath and wheezing.   Cardiovascular: Negative for chest pain.  Gastrointestinal: Positive for abdominal distention, abdominal pain and vomiting. Negative for diarrhea and nausea.  Endocrine: Negative for polydipsia, polyphagia and polyuria.  Genitourinary: Negative for dysuria, frequency and hematuria.  Musculoskeletal: Negative for gait problem.  Skin: Negative for color change, pallor and rash.  Neurological: Negative for dizziness, syncope, light-headedness and headaches.  Hematological: Does not bruise/bleed easily.  Psychiatric/Behavioral: Negative for behavioral problems and confusion.     Physical Exam Updated Vital Signs BP (!) 188/107 (BP Location: Left Arm)   Pulse 68   Temp 99.3 F (37.4 C) (Oral)   Resp 18   Ht 6\' 4"  (1.93 m)   Wt 95.3 kg (210 lb)   SpO2 98%   BMI 25.56 kg/m   Physical Exam  Constitutional: He is oriented to person, place, and time. He appears well-developed and well-nourished. No distress.  HENT:  Head: Normocephalic.  Conjunctivae not pale.  No scleral icterus.  Eyes: Conjunctivae are normal.  Pupils are equal, round, and reactive to light. No scleral icterus.  Neck: Normal range of motion. Neck supple. No thyromegaly present.  Cardiovascular: Normal rate and regular rhythm. Exam reveals no gallop and no friction rub.  No murmur heard. Pulmonary/Chest: Effort normal and breath sounds normal. No respiratory distress. He has no wheezes. He has no rales.  Abdominal: Soft. Bowel sounds are normal. He exhibits no distension. There is no tenderness. There is no rebound.  Distended abdomen.  Quiet.  No peritoneal irritation.  Tenderness in the epigastrium and bilateral upper quadrants, right greater than left.  Musculoskeletal: Normal range of motion.  Neurological: He is alert and oriented to person, place, and time.  Skin: Skin is warm and dry. No rash noted.  Psychiatric: He has a normal mood and affect. His behavior is normal.     ED Treatments / Results  Labs (all labs ordered are listed, but only abnormal results are displayed) Labs Reviewed  COMPREHENSIVE METABOLIC PANEL - Abnormal; Notable for the following components:      Result Value   Glucose, Bld 104 (*)    BUN 42 (*)    Creatinine, Ser 5.20 (*)    Calcium 8.2 (*)    AST 10 (*)    ALT 6 (*)    GFR calc non Af Amer 11 (*)    GFR calc Af Amer 13 (*)    All other components within normal limits  CBC - Abnormal; Notable for the following components:   RBC 3.81 (*)    Hemoglobin 11.5 (*)    HCT 35.2 (*)    All other components within normal limits  URINALYSIS, ROUTINE W REFLEX MICROSCOPIC - Abnormal; Notable for the following components:   Specific Gravity, Urine 1.000 (*)    Hgb urine dipstick SMALL (*)    Protein, ur 100 (*)    Squamous Epithelial / LPF 0-5 (*)    All other components within normal limits  LIPASE, BLOOD    EKG  EKG Interpretation None       Radiology Ct Abdomen Pelvis Wo Contrast  Result Date: 04/27/2017 CLINICAL DATA:  Right upper quadrant pain for 4 days. EXAM: CT ABDOMEN AND  PELVIS WITHOUT CONTRAST TECHNIQUE: Multidetector CT imaging of the abdomen and pelvis was performed following the standard protocol without IV contrast. COMPARISON:  12/14/2016 FINDINGS: Lower chest: No acute abnormality. Hepatobiliary: Multiple low-attenuation foci are again noted throughout the liver compatible  with multiple cysts. Gallbladder appears normal. Pancreas: Unremarkable. No pancreatic ductal dilatation or surrounding inflammatory changes. Spleen: Normal in size without focal abnormality. Adrenals/Urinary Tract: The adrenal glands are normal. Bilateral nephromegaly with innumerable cysts. The cysts are of varying size and densities, and are incompletely characterized without IV contrast. Multiple left renal calculi. No hydronephrosis or hydroureter. Urinary mild diffuse bladder wall thickening identified with calcification. Stomach/Bowel: The stomach appears normal. There is abnormal wall thickening involving the the pylorus and first portion of the duodenum. The wall thickness measures up to 1.1 cm and there is surrounding soft tissue stranding as well as retroperitoneal free fluid. No evidence for bowel perforation. No free intraperitoneal air. The remaining small bowel loops are unremarkable. The appendix is not visualized. Distal colonic diverticula without acute inflammation. Vascular/Lymphatic: Aortic atherosclerosis. No aneurysm. No upper abdominal adenopathy. No pelvic or inguinal adenopathy. Reproductive: Prostate is unremarkable. Other: No abdominal wall hernia or abnormality. Musculoskeletal: There is degenerative disc disease identified within the lumbar spine. Attempted interbody fusion at L2-3 and L3-4 identified. The disc spaces remain distinct compatible with nonunion. Marked L5-S1 degenerative disc disease noted. IMPRESSION: 1. Wall thickening and inflammation with a free-fluid is noted involving the proximal duodenum. Findings are worrisome for duodenitis. No evidence for perforation.  No gastric outlet obstruction identified. 2.  Aortic Atherosclerosis (ICD10-I70.0). 3. Lumbar spondylosis status post attempted interbody fusion of L2-3 and L3-4. 4. Changes of polycystic kidney disease. Nonobstructing left renal calculi again noted. Electronically Signed   By: Kerby Moors M.D.   On: 04/27/2017 17:07    Procedures Procedures (including critical care time)  Medications Ordered in ED Medications  0.9 %  sodium chloride infusion (not administered)  morphine 4 MG/ML injection 4 mg (4 mg Intravenous Given 04/27/17 1633)  pantoprazole (PROTONIX) 80 mg in sodium chloride 0.9 % 100 mL IVPB (not administered)  pantoprazole (PROTONIX) 80 mg in sodium chloride 0.9 % 250 mL (0.32 mg/mL) infusion (not administered)  pantoprazole (PROTONIX) injection 40 mg (not administered)  sodium chloride 0.9 % bolus 1,000 mL (1,000 mLs Intravenous New Bag/Given 04/27/17 1632)  ondansetron (ZOFRAN) injection 4 mg (4 mg Intravenous Given 04/27/17 1632)  pantoprazole (PROTONIX) injection 40 mg (40 mg Intravenous Given 04/27/17 1804)     Initial Impression / Assessment and Plan / ED Course  I have reviewed the triage vital signs and the nursing notes.  Pertinent labs & imaging results that were available during my care of the patient were reviewed by me and considered in my medical decision making (see chart for details).     Labs show creatinine slightly above baseline at 5.2.  BUN up a bit at 2.  T scan shows marked polycystic disease of his kidneys.  Shows thickening of the duodenum.  No obvious dilatation of the stomach.  Traces of fluid surrounding the duodenal bulb without free air or diffuse free fluid in the abdomen.  Question, likely duodenitis.  Was given IV Protonix.  Had been given Zofran, and morphine his symptoms were improving.  Was given some p.o. water.  Within 10 minutes recurrence of symptoms and had emesis.  Will discuss treatment of his duodenitis with GI.  I have asked for Protonix  bolus and drip.  Plan call to the hospitalist for admission.  Final Clinical Impressions(s) / ED Diagnoses   Final diagnoses:  Duodenitis  Bilious vomiting, presence of nausea not specified    ED Discharge Orders    None       [name], MD  04/27/17 1835  

## 2017-04-27 NOTE — ED Notes (Signed)
Pt transported to CT ?

## 2017-04-27 NOTE — H&P (Signed)
TRH H&P    Patient Demographics:    Aaron [name], is a 62 y.o. male  MRN: [id number]  DOB - 02-11-1956  Admit Date - 04/27/2017  Referring MD/NP/PA:  Dr. Jeneen Rinks  Outpatient Primary MD for the patient is Scotty Court Zella Richer., MD  Patient coming from: home  Chief Complaint  Patient presents with  . Abdominal Pain      HPI:    Aaron [name]  is a 62 y.o. male, with history of paroxysmal atrial fibrillation, on chronic anticoagulation with Coumadin, ischemic cardio myopathy, chronic kidney disease stage IV, Schatzki ring requiring dilation on EGD in November 2018, peripheral arterial disease status post stenting of the left superficial femoral artery came to hospital with chief complaints of nausea and vomiting for past three days. Patient says that he started having abdominal pain and was unable to keep anything down. Complained of green bilious vomitus. Denies blood in the vomitus. No black colored stool. EGD in November showedSchatzki ring, requiring minimal dilation. Duodenum was normal at that time. Today CT scan showed changes consistent with the duodenitis including traces of fluid surrounding the duodenal bulb without free air or free  fluid in the abdomen.  He denies chest pain. But complains of epigastric pain. Denies diarrhea. No fever or chills. No dysuria. Did not pass out. No blurred vision  GI has been consulted by ED physician   Review of systems:      All other systems reviewed and are negative.   With Past History of the following :    Past Medical History:  Diagnosis Date  . Anxiety   . Arthritis    knees , back , shoulders  . CAD (coronary artery disease)    Mild nonobstructive disease at cardiac catheterization 2002  . Chronic back pain   . CKD (chronic kidney disease), stage II   . COPD (chronic obstructive pulmonary disease) ([place] [facility])   . Diverticulitis   . Esophageal reflux     . Essential hypertension   . Hepatitis C   . History of syncope   . Hyperlipidemia   . Migraines   . Nephrolithiasis   . PAT (paroxysmal atrial tachycardia) ([place])   . Peripheral arterial disease (HCC)    Occluded left superficial femoral artery status post stent June 2016 - Dr. Trula Slade  . Pneumonia    as child   40 years old  . Polycystic kidney, unspecified type   . Syncope 09/2014      Past Surgical History:  Procedure Laterality Date  . BACK SURGERY    . BIOPSY  12/17/2016   Procedure: BIOPSY;  Surgeon: Daneil Dolin, MD;  Location: [facility];  Service: Gastroenterology;;  gastric colon  . BUNIONECTOMY    . CERVICAL SPINE SURGERY    . COLONOSCOPY  2008   Dr. Oneida Alar: rare sigmoid colon diverticulosis, internal hemorrhoids.   . COLONOSCOPY WITH PROPOFOL N/A 12/17/2016   dense left-sided diverticulosis, right colon ulcers s/p biopsy query occult NSAID use vs transient ischemia, not consistent with IBD. CMV stains negative.   Marland Kitchen  ESOPHAGEAL DILATION  12/17/2016   EGD with mild Schatzki's ring s/p dilatation, small hiatal hernia, erosive gastropathy (negative H.pylori gastritis)  . ESOPHAGOGASTRODUODENOSCOPY  2008   Dr. Oneida Alar: normal esophagus without Barrett's, antritis and duodenitis, path with H.pylori gastritis  . ESOPHAGOGASTRODUODENOSCOPY (EGD) WITH PROPOFOL N/A 12/17/2016   Procedure: ESOPHAGOGASTRODUODENOSCOPY (EGD) WITH PROPOFOL;  Surgeon: Daneil Dolin, MD;  Location: [facility];  Service: Gastroenterology;  Laterality: N/A;  . HERNIA REPAIR     umbilical  . PERIPHERAL VASCULAR CATHETERIZATION N/A 09/20/2014   Procedure: Abdominal Aortogram;  Surgeon: Serafina Mitchell, MD;  Location: [facility];  Service: Cardiovascular;  Laterality: N/A;      Social History:      Social History   Tobacco Use  . Smoking status: Former Smoker    Packs/day: 0.50    Years: 47.00    Pack years: 23.50    Types: Cigarettes  . Smokeless tobacco: Former Systems developer    Types:  Snuff, Chew  Substance Use Topics  . Alcohol use: No    Alcohol/week: 0.0 oz       Family History :     Family History  Problem Relation Age of Onset  . Alcoholism Mother   . Heart disease Father        Massive heart attack  . Heart attack Father   . Atrial fibrillation Father   . Colon cancer Father   . Colon cancer Maternal Grandfather 66  . Alcoholism Maternal Grandfather   . Renal cancer Cousin   . Ovarian cancer Sister       Home Medications:   Prior to Admission medications   Medication Sig Start Date End Date Taking? Authorizing Provider  carvedilol (COREG) 25 MG tablet Take 1 tablet (25 mg total) 2 (two) times daily by mouth. 02/25/17 05/26/17  Imogene Burn, PA-C  doxazosin (CARDURA) 8 MG tablet Take 8 mg by mouth at bedtime.     [provider]  guaiFENesin (MUCINEX) 600 MG 12 hr tablet Take 600 mg by mouth daily as needed (congestion).    [provider]  hydrOXYzine (ATARAX/VISTARIL) 25 MG tablet Take 25 mgs by mouth once daily 11/28/16   [provider]  oxyCODONE-acetaminophen (PERCOCET) 10-325 MG per tablet Take 1 tablet by mouth every 4 (four) hours as needed for pain.     [provider]  pantoprazole (PROTONIX) 40 MG tablet Take 40 mg daily by mouth. 01/26/17   [provider]  sodium bicarbonate 650 MG tablet Take 1 tablet (650 mg total) by mouth 3 (three) times daily. 12/18/16   Reyne Dumas, MD  torsemide (DEMADEX) 20 MG tablet Take 1 tablet (20 mg total) by mouth daily. 12/26/16   Reyne Dumas, MD  warfarin (COUMADIN) 5 MG tablet Take 1/2 to 1 tablet by mouth daily as directed by coumadin clinic Patient taking differently: Take 5 mg by mouth every evening.  03/07/17   Minus Breeding, MD     Allergies:    No Known Allergies   Physical Exam:   Vitals  Blood pressure (!) 187/96, pulse 72, temperature 99.3 F (37.4 C), temperature source Oral, resp. rate 18, height 6\' 4"  (1.93 m), weight 95.3 kg (210  lb), SpO2 98 %.  1.  General: appears in no acute distress  2. Psychiatric:  Intact judgement and  insight, awake alert, oriented x 3.  3. Neurologic: No focal neurological deficits, all cranial nerves intact.Strength 5/5 all 4 extremities, sensation intact all 4 extremities, plantars  down going.  4. Eyes :  anicteric sclerae, moist conjunctivae with no lid lag. PERRLA.  5. ENMT:  Oropharynx clear with moist mucous membranes and good dentition  6. Neck:  supple, no cervical lymphadenopathy appriciated, No thyromegaly  7. Respiratory : Normal respiratory effort, good air movement bilaterally,clear to  auscultation bilaterally  8. Cardiovascular : RRR, no gallops, rubs or murmurs, no leg edema  9. Gastrointestinal:  Positive bowel sounds, abdomen soft,tender to palpation in epigastric region,no hepatosplenomegaly, no rigidity or guarding       10. Skin:  No cyanosis, normal texture and turgor, no rash, lesions or ulcers  11.Musculoskeletal:  Good muscle tone,  joints appear normal , no effusions,  normal range of motion    Data Review:    CBC Recent Labs  Lab 04/27/17 1245  WBC 8.0  HGB 11.5*  HCT 35.2*  PLT 216  MCV 92.4  MCH 30.2  MCHC 32.7  RDW 15.3   ------------------------------------------------------------------------------------------------------------------  Chemistries  Recent Labs  Lab 04/27/17 1245  NA 138  K 4.9  CL 105  CO2 22  GLUCOSE 104*  BUN 42*  CREATININE 5.20*  CALCIUM 8.2*  AST 10*  ALT 6*  ALKPHOS 88  BILITOT 0.8   ------------------------------------------------------------------------------------------------------------------  ------------------------------------------------------------------------------------------------------------------ GFR: Estimated Creatinine Clearance: 18.5 mL/min (A) (by C-G formula based on SCr of 5.2 mg/dL (H)). Liver Function Tests: Recent Labs  Lab 04/27/17 1245  AST 10*  ALT 6*   ALKPHOS 88  BILITOT 0.8  PROT 7.0  ALBUMIN 3.5   Recent Labs  Lab 04/27/17 1245  LIPASE 40    --------------------------------------------------------------------------------------------------------------- Urine analysis:    Component Value Date/Time   COLORURINE YELLOW 04/27/2017 1216   APPEARANCEUR CLEAR 04/27/2017 1216   LABSPEC 1.000 (L) 04/27/2017 1216   PHURINE 6.0 04/27/2017 1216   GLUCOSEU NEGATIVE 04/27/2017 1216   HGBUR SMALL (A) 04/27/2017 1216   BILIRUBINUR NEGATIVE 04/27/2017 1216   KETONESUR NEGATIVE 04/27/2017 1216   PROTEINUR 100 (A) 04/27/2017 1216   UROBILINOGEN 0.2 03/03/2010 1056   NITRITE NEGATIVE 04/27/2017 1216   LEUKOCYTESUR NEGATIVE 04/27/2017 1216      Imaging Results:    Ct Abdomen Pelvis Wo Contrast  Result Date: 04/27/2017 CLINICAL DATA:  Right upper quadrant pain for 4 days. EXAM: CT ABDOMEN AND PELVIS WITHOUT CONTRAST TECHNIQUE: Multidetector CT imaging of the abdomen and pelvis was performed following the standard protocol without IV contrast. COMPARISON:  12/14/2016 FINDINGS: Lower chest: No acute abnormality. Hepatobiliary: Multiple low-attenuation foci are again noted throughout the liver compatible with multiple cysts. Gallbladder appears normal. Pancreas: Unremarkable. No pancreatic ductal dilatation or surrounding inflammatory changes. Spleen: Normal in size without focal abnormality. Adrenals/Urinary Tract: The adrenal glands are normal. Bilateral nephromegaly with innumerable cysts. The cysts are of varying size and densities, and are incompletely characterized without IV contrast. Multiple left renal calculi. No hydronephrosis or hydroureter. Urinary mild diffuse bladder wall thickening identified with calcification. Stomach/Bowel: The stomach appears normal. There is abnormal wall thickening involving the the pylorus and first portion of the duodenum. The wall thickness measures up to 1.1 cm and there is surrounding soft tissue stranding  as well as retroperitoneal free fluid. No evidence for bowel perforation. No free intraperitoneal air. The remaining small bowel loops are unremarkable. The appendix is not visualized. Distal colonic diverticula without acute inflammation. Vascular/Lymphatic: Aortic atherosclerosis. No aneurysm. No upper abdominal adenopathy. No pelvic or inguinal adenopathy. Reproductive: Prostate is unremarkable. Other: No abdominal wall hernia or abnormality. Musculoskeletal: There is degenerative disc  disease identified within the lumbar spine. Attempted interbody fusion at L2-3 and L3-4 identified. The disc spaces remain distinct compatible with nonunion. Marked L5-S1 degenerative disc disease noted. IMPRESSION: 1. Wall thickening and inflammation with a free-fluid is noted involving the proximal duodenum. Findings are worrisome for duodenitis. No evidence for perforation. No gastric outlet obstruction identified. 2.  Aortic Atherosclerosis (ICD10-I70.0). 3. Lumbar spondylosis status post attempted interbody fusion of L2-3 and L3-4. 4. Changes of polycystic kidney disease. Nonobstructing left renal calculi again noted. Electronically Signed   By: Kerby Moors M.D.   On: 04/27/2017 17:07      Assessment & Plan:    Active Problems:   Essential hypertension   PAD (peripheral artery disease) (HCC)   CAD (coronary artery disease)   Ischemic cardiomyopathy   Paroxysmal atrial fibrillation (HCC)   History of hepatitis C   Duodenitis   Schatzki's ring of distal esophagus   1. Duodenitis-will keep NPO. Started on IV Protonix infusion in the ED. Likely will require EGD. G.I. has been consulted by ED physician. Will await their recommendations. 2. History of Schatzki's ring- EGD in November 2018 showed mild schatzki ring at gastroesophageal junction. Which required  Dilation. Patient may have had recurrence of the same and would require another dilation. Will keep him NPO.  3. Acute kidney injury on CKD stage IV-  patient's creatinine today is 5.20, his baseline creatinine was around 4 to 4.8 in November 2018, will hold diuretics. Patient is followed by nephrology at [facility]. Plan for hemodialysis as outpatient. Started on gentle IV hydration. Will follow BMP in am. 4. Paroxysmal atrial fibrillation- CHA2DS2VASc score 3, hold Coumadin in anticipation for EGD in a.m. Consider restarting Coumadin after EGD. 5. Essential hypertension-blood pressure is elevated, hold Coreg. Will start metoprolol 2.5 mg IV Q8 hours. 6. CAD-stable, check EKG.   DVT Prophylaxis-   on Coumadin  AM Labs Ordered, also please review Full Orders  Family Communication: Admission, patients condition and plan of care including tests being ordered have been discussed with the patient  who indicate understanding and agree with the plan and Code Status.  Code Status: full code  Admission status:  inpatient  Time spent in minutes :  60 minutes   Oswald Hillock M.D on 04/27/2017 at 7:44 PM  Between 7am to 7pm - Pager - [phone]. After 7pm go to [email or web address] - password [facility]  [facility] - Office  [phone]

## 2017-04-28 ENCOUNTER — Encounter (HOSPITAL_COMMUNITY): Payer: Self-pay | Admitting: Gastroenterology

## 2017-04-28 DIAGNOSIS — I255 Ischemic cardiomyopathy: Secondary | ICD-10-CM

## 2017-04-28 DIAGNOSIS — I10 Essential (primary) hypertension: Secondary | ICD-10-CM

## 2017-04-28 DIAGNOSIS — R1114 Bilious vomiting: Secondary | ICD-10-CM

## 2017-04-28 DIAGNOSIS — I480 Paroxysmal atrial fibrillation: Secondary | ICD-10-CM

## 2017-04-28 LAB — CBC
HCT: 31.1 % — ABNORMAL LOW (ref 39.0–52.0)
Hemoglobin: 10.1 g/dL — ABNORMAL LOW (ref 13.0–17.0)
MCH: 30.2 pg (ref 26.0–34.0)
MCHC: 32.5 g/dL (ref 30.0–36.0)
MCV: 93.1 fL (ref 78.0–100.0)
PLATELETS: 170 10*3/uL (ref 150–400)
RBC: 3.34 MIL/uL — ABNORMAL LOW (ref 4.22–5.81)
RDW: 15.4 % (ref 11.5–15.5)
WBC: 5.8 10*3/uL (ref 4.0–10.5)

## 2017-04-28 LAB — COMPREHENSIVE METABOLIC PANEL
ALT: 6 U/L — ABNORMAL LOW (ref 17–63)
AST: 10 U/L — ABNORMAL LOW (ref 15–41)
Albumin: 2.9 g/dL — ABNORMAL LOW (ref 3.5–5.0)
Alkaline Phosphatase: 71 U/L (ref 38–126)
Anion gap: 11 (ref 5–15)
BUN: 44 mg/dL — ABNORMAL HIGH (ref 6–20)
CHLORIDE: 107 mmol/L (ref 101–111)
CO2: 19 mmol/L — ABNORMAL LOW (ref 22–32)
CREATININE: 4.7 mg/dL — AB (ref 0.61–1.24)
Calcium: 7.5 mg/dL — ABNORMAL LOW (ref 8.9–10.3)
GFR, EST AFRICAN AMERICAN: 14 mL/min — AB (ref >60)
GFR, EST NON AFRICAN AMERICAN: 12 mL/min — AB (ref >60)
Glucose, Bld: 77 mg/dL (ref 65–99)
POTASSIUM: 4.5 mmol/L (ref 3.5–5.1)
Sodium: 137 mmol/L (ref 135–145)
Total Bilirubin: 0.7 mg/dL (ref 0.3–1.2)
Total Protein: 5.8 g/dL — ABNORMAL LOW (ref 6.5–8.1)

## 2017-04-28 MED ORDER — SODIUM BICARBONATE 650 MG PO TABS
650.0000 mg | ORAL_TABLET | Freq: Three times a day (TID) | ORAL | Status: DC
  Administered 2017-04-28 – 2017-04-30 (×5): 650 mg via ORAL
  Filled 2017-04-28 (×5): qty 1

## 2017-04-28 MED ORDER — CARVEDILOL 12.5 MG PO TABS
25.0000 mg | ORAL_TABLET | Freq: Two times a day (BID) | ORAL | Status: DC
  Administered 2017-04-28 – 2017-04-30 (×3): 25 mg via ORAL
  Filled 2017-04-28: qty 8
  Filled 2017-04-28: qty 2
  Filled 2017-04-28: qty 8
  Filled 2017-04-28: qty 2

## 2017-04-28 MED ORDER — DOXAZOSIN MESYLATE 2 MG PO TABS
8.0000 mg | ORAL_TABLET | Freq: Every day | ORAL | Status: DC
  Administered 2017-04-28 – 2017-04-29 (×2): 8 mg via ORAL
  Filled 2017-04-28 (×2): qty 4

## 2017-04-28 MED ORDER — HEPARIN SODIUM (PORCINE) 5000 UNIT/ML IJ SOLN: 5000.0000 [IU] | Freq: Three times a day (TID) | INTRAMUSCULAR | Status: DC

## 2017-04-28 MED ORDER — PANTOPRAZOLE SODIUM 40 MG IV SOLR
40.0000 mg | Freq: Two times a day (BID) | INTRAVENOUS | Status: DC
  Administered 2017-04-28 – 2017-04-30 (×5): 40 mg via INTRAVENOUS
  Filled 2017-04-28 (×5): qty 40

## 2017-04-28 MED ORDER — PANTOPRAZOLE SODIUM 40 MG IV SOLR
INTRAVENOUS | Status: AC
  Filled 2017-04-28: qty 80

## 2017-04-28 MED ORDER — BOOST / RESOURCE BREEZE PO LIQD CUSTOM
1.0000 | Freq: Two times a day (BID) | ORAL | Status: DC
  Administered 2017-04-28 – 2017-04-30 (×3): 1 via ORAL

## 2017-04-28 MED ORDER — OXYCODONE-ACETAMINOPHEN 5-325 MG PO TABS
1.0000 | ORAL_TABLET | ORAL | Status: DC | PRN
Start: 2017-04-28 — End: 2017-04-30
  Administered 2017-04-28 – 2017-04-30 (×7): 1 via ORAL
  Filled 2017-04-28 (×7): qty 1

## 2017-04-28 MED ORDER — OXYCODONE HCL 5 MG PO TABS
5.0000 mg | ORAL_TABLET | ORAL | Status: DC | PRN
Start: 2017-04-28 — End: 2017-04-30
  Administered 2017-04-28 – 2017-04-30 (×7): 5 mg via ORAL
  Filled 2017-04-28 (×7): qty 1

## 2017-04-28 NOTE — Consult Note (Signed)
Referring Provider: Dr. Darrick Meigs  Primary Care Physician:  Deloria Lair., MD Primary Gastroenterologist:  Dr. Oneida Alar   Date of Admission: 04/27/17 Date of Consultation: 04/28/17  Reason for Consultation:  Duodenitis   HPI:  Aaron [name] is a 62 y.o. year old male with a history of erosive gastropathy on EGD in Sept 2018, colonic ulcers on colonoscopy in Sept 2018 felt to be secondary to NSAID effect, presenting with epigastric pain, N/V. CT performed without contrast in ED with duodenitis but no evidence of perforation. Hgb around baseline for patient. Admitted for further evaluation.   Early last week started having nausea, worsening on Thursday with N/V. Felt like his abdomen was bloated, difficult to turn or move a certain way. Felt like something was ripping. Pain in epigastric region. Feels some better this morning but not 100%. Wasn't able to tolerate a diet at home. Throwing up green vomit. No hematemesis. No problems since being seen as an outpatient at [facility] Nov 2018. No dysphagia. No NSAIDs or aspirin powders. Taking Protonix once a day. Pain is improved now. N/V improved. Soreness getting better. Denies ETOH use for the past 2 years.   No sick contacts. No changes in bowel habits. No fever/chills. History of NSAID use in the past. Due to colonic ulcers on CT, mesenteric vasculature was to be assessed. Was unable to undergo CTA due to chronic kidney disease so mesenteric duplex was ordered but never completed by patient and no-show most recently.   Past Medical History:  Diagnosis Date  . Anxiety   . Arthritis    knees , back , shoulders  . CAD (coronary artery disease)    Mild nonobstructive disease at cardiac catheterization 2002  . Chronic back pain   . CKD (chronic kidney disease), stage II   . COPD (chronic obstructive pulmonary disease) ([place])   . Diverticulitis   . Esophageal reflux   . Essential hypertension   . Hepatitis C   . History of syncope   . Hyperlipidemia    . Migraines   . Nephrolithiasis   . PAT (paroxysmal atrial tachycardia) ([place])   . Peripheral arterial disease (HCC)    Occluded left superficial femoral artery status post stent June 2016 - Dr. Trula Slade  . Pneumonia    as child   60 years old  . Polycystic kidney, unspecified type   . Syncope 09/2014    Past Surgical History:  Procedure Laterality Date  . BACK SURGERY    . BIOPSY  12/17/2016   Procedure: BIOPSY;  Surgeon: Daneil Dolin, MD;  Location: [facility];  Service: Gastroenterology;;  gastric colon  . BUNIONECTOMY    . CERVICAL SPINE SURGERY    . COLONOSCOPY  2008   Dr. Oneida Alar: rare sigmoid colon diverticulosis, internal hemorrhoids.   . COLONOSCOPY WITH PROPOFOL N/A 12/17/2016   dense left-sided diverticulosis, right colon ulcers s/p biopsy query occult NSAID use vs transient ischemia, not consistent with IBD. CMV stains negative.   . ESOPHAGEAL DILATION  12/17/2016   EGD with mild Schatzki's ring s/p dilatation, small hiatal hernia, erosive gastropathy (negative H.pylori gastritis)  . ESOPHAGOGASTRODUODENOSCOPY  2008   Dr. Oneida Alar: normal esophagus without Barrett's, antritis and duodenitis, path with H.pylori gastritis  . ESOPHAGOGASTRODUODENOSCOPY (EGD) WITH PROPOFOL N/A 12/17/2016   Procedure: ESOPHAGOGASTRODUODENOSCOPY (EGD) WITH PROPOFOL;  Surgeon: Daneil Dolin, MD;  Location: [facility];  Service: Gastroenterology;  Laterality: N/A;  . HERNIA REPAIR     umbilical  . PERIPHERAL  VASCULAR CATHETERIZATION N/A 09/20/2014   Procedure: Abdominal Aortogram;  Surgeon: Serafina Mitchell, MD;  Location: [place] [facility];  Service: Cardiovascular;  Laterality: N/A;    Prior to Admission medications   Medication Sig Start Date End Date Taking? Authorizing Provider  carvedilol (COREG) 25 MG tablet Take 1 tablet (25 mg total) 2 (two) times daily by mouth. 02/25/17 05/26/17 Yes Imogene Burn, PA-C  doxazosin (CARDURA) 8 MG tablet Take 8 mg by mouth at bedtime.    Yes [provider]  guaiFENesin (MUCINEX) 600 MG 12 hr tablet Take 600 mg by mouth daily as needed (congestion).   Yes [provider]  hydrOXYzine (ATARAX/VISTARIL) 25 MG tablet Take 25 mgs by mouth once daily 11/28/16  Yes [provider]  oxyCODONE-acetaminophen (PERCOCET) 10-325 MG per tablet Take 1 tablet by mouth every 4 (four) hours as needed for pain.    Yes [provider]  pantoprazole (PROTONIX) 40 MG tablet Take 40 mg daily by mouth. 01/26/17  Yes [provider]  sodium bicarbonate 650 MG tablet Take 1 tablet (650 mg total) by mouth 3 (three) times daily. 12/18/16  Yes Reyne Dumas, MD  torsemide (DEMADEX) 20 MG tablet Take 1 tablet (20 mg total) by mouth daily. 12/26/16  Yes Reyne Dumas, MD  warfarin (COUMADIN) 5 MG tablet Take 1/2 to 1 tablet by mouth daily as directed by coumadin clinic Patient taking differently: Take 5 mg by mouth every evening.  03/07/17   Minus Breeding, MD    Current Facility-Administered Medications  Medication Dose Route Frequency Provider Last Rate Last Dose  . 0.9 %  sodium chloride infusion   Intravenous Continuous Oswald Hillock, MD 75 mL/hr at 04/28/17 0300    . acetaminophen (TYLENOL) tablet 650 mg  650 mg Oral Q6H PRN Oswald Hillock, MD       Or  . acetaminophen (TYLENOL) suppository 650 mg  650 mg Rectal Q6H PRN Oswald Hillock, MD      . HYDROmorphone (DILAUDID) injection 1 mg  1 mg Intravenous Q4H PRN Oswald Hillock, MD   1 mg at 04/28/17 0509  . metoprolol tartrate (LOPRESSOR) injection 2.5 mg  2.5 mg Intravenous Q8H Oswald Hillock, MD   2.5 mg at 04/28/17 0505  . ondansetron (ZOFRAN) tablet 4 mg  4 mg Oral Q6H PRN Oswald Hillock, MD       Or  . ondansetron (ZOFRAN) injection 4 mg  4 mg Intravenous Q6H PRN Oswald Hillock, MD      . pantoprazole (PROTONIX) 80 mg in sodium chloride 0.9 % 250 mL (0.32 mg/mL) infusion  8 mg/hr Intravenous Continuous [name], MD 25 mL/hr at 04/28/17 0604 8 mg/hr at 04/28/17 0604  .  [START ON 05/01/2017] pantoprazole (PROTONIX) injection 40 mg  40 mg Intravenous Q12H [name], MD        Allergies as of 04/27/2017  . (No Known Allergies)    Family History  Problem Relation Age of Onset  . Alcoholism Mother   . Heart disease Father        Massive heart attack  . Heart attack Father   . Atrial fibrillation Father   . Colon cancer Father   . Colon cancer Maternal Grandfather 47  . Alcoholism Maternal Grandfather   . Renal cancer Cousin   . Ovarian cancer Sister     Social History   Socioeconomic History  . Marital status: Legally Separated    Spouse name: Not  on file  . Number of children: 2  . Years of education: Not on file  . Highest education level: Not on file  Social Needs  . Financial resource strain: Not on file  . Food insecurity - worry: Not on file  . Food insecurity - inability: Not on file  . Transportation needs - medical: Not on file  . Transportation needs - non-medical: Not on file  Occupational History  . Occupation: DISABLED  Tobacco Use  . Smoking status: Former Smoker    Packs/day: 0.50    Years: 47.00    Pack years: 23.50    Types: Cigarettes  . Smokeless tobacco: Former Systems developer    Types: Snuff, Chew  Substance and Sexual Activity  . Alcohol use: No    Alcohol/week: 0.0 oz  . Drug use: No    Comment: states in remote past he used "whatever was around"  . Sexual activity: Not on file  Other Topics Concern  . Not on file  Social History Narrative   Disabled from Back since 2007    Review of Systems: Gen: Denies fever, chills, loss of appetite, change in weight or weight loss CV: Denies chest pain, heart palpitations, syncope, edema  Resp: +cough for past few days  GI: see HPI  GU : Denies urinary burning, urinary frequency, urinary incontinence.  MS: Denies joint pain,swelling, cramping Derm: Denies rash, itching, dry skin Psych: Denies depression, anxiety,confusion, or memory loss Heme: Denies bruising, bleeding,  and enlarged lymph nodes.  Physical Exam: Vital signs in last 24 hours: Temp:  [98 F (36.7 C)-99.3 F (37.4 C)] 98 F (36.7 C) (01/15 0454) Pulse Rate:  [63-84] 63 (01/15 0454) Resp:  [12-18] 18 (01/15 0454) BP: (167-201)/(91-126) 167/100 (01/15 0454) SpO2:  [95 %-99 %] 98 % (01/15 0454) Weight:  [204 lb 4.8 oz (92.7 kg)-210 lb (95.3 kg)] 204 lb 4.8 oz (92.7 kg) (01/14 2121) Last BM Date: 04/25/17 General:   Alert,  Well-developed, well-nourished, pleasant and cooperative in NAD Head:  Normocephalic and atraumatic. Eyes:  Sclera clear, no icterus.   Conjunctiva pink. Ears:  Normal auditory acuity. Nose:  No deformity, discharge,  or lesions. Mouth:  No deformity or lesions, dentition normal. Lungs:  Mild expiratory wheeze  Heart:  S1 S2 present without murmurs  Abdomen:  +BS, distended but soft (at baseline for patient) moderate TTP epigastric Rectal:  Deferred  Msk:  Symmetrical without gross deformities. Normal posture. Extremities:  Without  edema. Neurologic:  Alert and  oriented x4 Psych:  Alert and cooperative. Normal mood and affect.  Intake/Output from previous day: 01/14 0701 - 01/15 0700 In: 1606.7 [I.V.:606.7; IV Piggyback:1000] Out: -  Intake/Output this shift: No intake/output data recorded.  Lab Results: Recent Labs    04/27/17 1245 04/28/17 0532  WBC 8.0 5.8  HGB 11.5* 10.1*  HCT 35.2* 31.1*  PLT 216 170   BMET Recent Labs    04/27/17 1245 04/28/17 0532  NA 138 137  K 4.9 4.5  CL 105 107  CO2 22 19*  GLUCOSE 104* 77  BUN 42* 44*  CREATININE 5.20* 4.70*  CALCIUM 8.2* 7.5*   LFT Recent Labs    04/27/17 1245 04/28/17 0532  PROT 7.0 5.8*  ALBUMIN 3.5 2.9*  AST 10* 10*  ALT 6* 6*  ALKPHOS 88 71  BILITOT 0.8 0.7   PT/INR Recent Labs    04/27/17 1245  LABPROT 13.0  INR 0.99    Studies/Results: Ct Abdomen Pelvis Wo Contrast  Result Date:  04/27/2017 CLINICAL DATA:  Right upper quadrant pain for 4 days. EXAM: CT ABDOMEN AND  PELVIS WITHOUT CONTRAST TECHNIQUE: Multidetector CT imaging of the abdomen and pelvis was performed following the standard protocol without IV contrast. COMPARISON:  12/14/2016 FINDINGS: Lower chest: No acute abnormality. Hepatobiliary: Multiple low-attenuation foci are again noted throughout the liver compatible with multiple cysts. Gallbladder appears normal. Pancreas: Unremarkable. No pancreatic ductal dilatation or surrounding inflammatory changes. Spleen: Normal in size without focal abnormality. Adrenals/Urinary Tract: The adrenal glands are normal. Bilateral nephromegaly with innumerable cysts. The cysts are of varying size and densities, and are incompletely characterized without IV contrast. Multiple left renal calculi. No hydronephrosis or hydroureter. Urinary mild diffuse bladder wall thickening identified with calcification. Stomach/Bowel: The stomach appears normal. There is abnormal wall thickening involving the the pylorus and first portion of the duodenum. The wall thickness measures up to 1.1 cm and there is surrounding soft tissue stranding as well as retroperitoneal free fluid. No evidence for bowel perforation. No free intraperitoneal air. The remaining small bowel loops are unremarkable. The appendix is not visualized. Distal colonic diverticula without acute inflammation. Vascular/Lymphatic: Aortic atherosclerosis. No aneurysm. No upper abdominal adenopathy. No pelvic or inguinal adenopathy. Reproductive: Prostate is unremarkable. Other: No abdominal wall hernia or abnormality. Musculoskeletal: There is degenerative disc disease identified within the lumbar spine. Attempted interbody fusion at L2-3 and L3-4 identified. The disc spaces remain distinct compatible with nonunion. Marked L5-S1 degenerative disc disease noted. IMPRESSION: 1. Wall thickening and inflammation with a free-fluid is noted involving the proximal duodenum. Findings are worrisome for duodenitis. No evidence for perforation.  No gastric outlet obstruction identified. 2.  Aortic Atherosclerosis (ICD10-I70.0). 3. Lumbar spondylosis status post attempted interbody fusion of L2-3 and L3-4. 4. Changes of polycystic kidney disease. Nonobstructing left renal calculi again noted. Electronically Signed   By: Kerby Moors M.D.   On: 04/27/2017 17:07    Impression: 62 year old male presenting with abdominal pain, N/V, now clinically improved with supportive measures, with non-contrast CT noting duodenitis. EGD fairly recent in Sept 2018 with mild Schatzki's ring s/p dilation, small hiatal hernia, erosive gastropathy and path negative for H.pylori. He has been avoiding NSAIDs and remains on Protonix daily. He is significantly improved from admission yesterday. Will provide clear liquids and continue to follow for now.   Plan: Stop Protonix infusion Start PPI IV BID Start clear liquids Supportive measures  As of note, still needs mesenteric duplex as outpatient to wrap up prior GI evaluation as per plan (colonic ulcers on colonoscopy likely NSAID effect but need to assess vasculature)   Annitta Needs, PhD, ANP-BC [facility]     LOS: 1 day    04/28/2017, 8:09 AM

## 2017-04-28 NOTE — Progress Notes (Signed)
PROGRESS NOTE    Aaron [name]  ZOX:[id number] DOB: [birth year] DOA: 04/27/2017 PCP: Deloria Lair., MD    Brief Narrative:  62 year old male with multiple medical problems, admitted to the hospital with abdominal pain.  CT scan showed possible duodenitis.  Started on PPI, bowel rest, GI consultation for possible EGD.  Also noted to have AK I on CKD, likely related to dehydration/diuretic use.  Started on IV fluids with some improvement.  Assessment & Plan:   Active Problems:   Essential hypertension   PAD (peripheral artery disease) (HCC)   CAD (coronary artery disease)   Ischemic cardiomyopathy   Paroxysmal atrial fibrillation (HCC)   History of hepatitis C   Duodenitis   Schatzki's ring of distal esophagus   1. Duodenitis.  Unclear etiology.  Infectious versus possible ischemic.  Continue on IV Protonix.  GI following.  Patient will be kept n.p.o. after midnight.  Tolerating clear liquids at this time. 2. Acute kidney injury on chronic kidney disease stage IV.  Likely related to dehydration/volume depletion.  Creatinine improving with diuresis.  Continue to hold diuretics.  Recheck labs in a.m. 3. Paroxysmal atrial fibrillation.  He reports that he is chronically on Coumadin, but has not taken any Coumadin since his hernia surgery approximately 6 weeks ago.  Can consider starting Coumadin once GI workup is complete. 4. Hypertension.  Blood pressures elevated.  Restart on Coreg and Cardura 5. Metabolic acidosis.  Related to chronic kidney disease.  Continue on oral bicarbonate supplementation. 6. Ischemic cardiomyopathy.  Echo from 01/2017 [facility] at [facility] shows ejection fraction 40-45%.  Volume status appears to be stable at this time.  Continue to monitor in the setting of hydration.   DVT prophylaxis: SCDs Code Status: Full code Family Communication: No family present Disposition Plan: Discharge home once improved   Consultants:   Gastroenterology  Procedures:       Antimicrobials:      Subjective: Feeling better.  Abdominal pain improving.  No vomiting.  Objective: Vitals:   04/27/17 2323 04/28/17 0454 04/28/17 1333 04/28/17 1500  BP:  (!) 167/100 (!) 164/89 (!) 177/83  Pulse:  63 66   Resp:  18 18   Temp:  98 F (36.7 C) 97.7 F (36.5 C)   TempSrc:  Oral Oral   SpO2: 95% 98% 100%   Weight:      Height:        Intake/Output Summary (Last 24 hours) at 04/28/2017 1901 Last data filed at 04/28/2017 1747 Gross per 24 hour  Intake 2086.67 ml  Output -  Net 2086.67 ml   Filed Weights   04/27/17 1215 04/27/17 2121  Weight: 95.3 kg (210 lb) 92.7 kg (204 lb 4.8 oz)    Examination:  General exam: Appears calm and comfortable  Respiratory system: Clear to auscultation. Respiratory effort normal. Cardiovascular system: S1 & S2 heard, RRR. No JVD, murmurs, rubs, gallops or clicks. No pedal edema. Gastrointestinal system: Abdomen is nondistended, soft and nontender. No organomegaly or masses felt. Normal bowel sounds heard. Central nervous system: Alert and oriented. No focal neurological deficits. Extremities: Symmetric 5 x 5 power. Skin: No rashes, lesions or ulcers Psychiatry: Judgement and insight appear normal. Mood & affect appropriate.     Data Reviewed: I have personally reviewed following labs and imaging studies  CBC: Recent Labs  Lab 04/27/17 1245 04/28/17 0532  WBC 8.0 5.8  HGB 11.5* 10.1*  HCT 35.2* 31.1*  MCV 92.4 93.1  PLT 216 170   Basic  Metabolic Panel: Recent Labs  Lab 04/27/17 1245 04/28/17 0532  NA 138 137  K 4.9 4.5  CL 105 107  CO2 22 19*  GLUCOSE 104* 77  BUN 42* 44*  CREATININE 5.20* 4.70*  CALCIUM 8.2* 7.5*   GFR: Estimated Creatinine Clearance: 20.5 mL/min (A) (by C-G formula based on SCr of 4.7 mg/dL (H)). Liver Function Tests: Recent Labs  Lab 04/27/17 1245 04/28/17 0532  AST 10* 10*  ALT 6* 6*  ALKPHOS 88 71  BILITOT 0.8 0.7  PROT 7.0 5.8*  ALBUMIN 3.5 2.9*   Recent  Labs  Lab 04/27/17 1245  LIPASE 40   No results for input(s): AMMONIA in the last 168 hours. Coagulation Profile: Recent Labs  Lab 04/27/17 1245  INR 0.99   Cardiac Enzymes: No results for input(s): CKTOTAL, CKMB, CKMBINDEX, TROPONINI in the last 168 hours. BNP (last 3 results) No results for input(s): PROBNP in the last 8760 hours. HbA1C: No results for input(s): HGBA1C in the last 72 hours. CBG: No results for input(s): GLUCAP in the last 168 hours. Lipid Profile: No results for input(s): CHOL, HDL, LDLCALC, TRIG, CHOLHDL, LDLDIRECT in the last 72 hours. Thyroid Function Tests: No results for input(s): TSH, T4TOTAL, FREET4, T3FREE, THYROIDAB in the last 72 hours. Anemia Panel: No results for input(s): VITAMINB12, FOLATE, FERRITIN, TIBC, IRON, RETICCTPCT in the last 72 hours. Sepsis Labs: No results for input(s): PROCALCITON, LATICACIDVEN in the last 168 hours.  No results found for this or any previous visit (from the past 240 hour(s)).       Radiology Studies: Ct Abdomen Pelvis Wo Contrast  Result Date: 04/27/2017 CLINICAL DATA:  Right upper quadrant pain for 4 days. EXAM: CT ABDOMEN AND PELVIS WITHOUT CONTRAST TECHNIQUE: Multidetector CT imaging of the abdomen and pelvis was performed following the standard protocol without IV contrast. COMPARISON:  12/14/2016 FINDINGS: Lower chest: No acute abnormality. Hepatobiliary: Multiple low-attenuation foci are again noted throughout the liver compatible with multiple cysts. Gallbladder appears normal. Pancreas: Unremarkable. No pancreatic ductal dilatation or surrounding inflammatory changes. Spleen: Normal in size without focal abnormality. Adrenals/Urinary Tract: The adrenal glands are normal. Bilateral nephromegaly with innumerable cysts. The cysts are of varying size and densities, and are incompletely characterized without IV contrast. Multiple left renal calculi. No hydronephrosis or hydroureter. Urinary mild diffuse bladder  wall thickening identified with calcification. Stomach/Bowel: The stomach appears normal. There is abnormal wall thickening involving the the pylorus and first portion of the duodenum. The wall thickness measures up to 1.1 cm and there is surrounding soft tissue stranding as well as retroperitoneal free fluid. No evidence for bowel perforation. No free intraperitoneal air. The remaining small bowel loops are unremarkable. The appendix is not visualized. Distal colonic diverticula without acute inflammation. Vascular/Lymphatic: Aortic atherosclerosis. No aneurysm. No upper abdominal adenopathy. No pelvic or inguinal adenopathy. Reproductive: Prostate is unremarkable. Other: No abdominal wall hernia or abnormality. Musculoskeletal: There is degenerative disc disease identified within the lumbar spine. Attempted interbody fusion at L2-3 and L3-4 identified. The disc spaces remain distinct compatible with nonunion. Marked L5-S1 degenerative disc disease noted. IMPRESSION: 1. Wall thickening and inflammation with a free-fluid is noted involving the proximal duodenum. Findings are worrisome for duodenitis. No evidence for perforation. No gastric outlet obstruction identified. 2.  Aortic Atherosclerosis (ICD10-I70.0). 3. Lumbar spondylosis status post attempted interbody fusion of L2-3 and L3-4. 4. Changes of polycystic kidney disease. Nonobstructing left renal calculi again noted. Electronically Signed   By: Kerby Moors M.D.   On: 04/27/2017  17:07        Scheduled Meds: . carvedilol  25 mg Oral BID WC  . doxazosin  8 mg Oral QHS  . feeding supplement  1 Container Oral BID BM  . pantoprazole (PROTONIX) IV  40 mg Intravenous Q12H  . sodium bicarbonate  650 mg Oral TID   Continuous Infusions: . sodium chloride 75 mL/hr at 04/28/17 1049     LOS: 1 day    Time spent: 49mins    Kathie Dike, MD [facility] Pager [phone]  If 7PM-7AM, please contact  night-coverage [email or web address] Password [email or web address] 04/28/2017, 7:01 PM

## 2017-04-28 NOTE — Progress Notes (Signed)
Initial Nutrition Assessment  DOCUMENTATION CODES:  Not applicable  INTERVENTION:  Boost Breeze po BID, each supplement provides 250 kcal and 9 grams of protein  NUTRITION DIAGNOSIS:  Inadequate oral intake related to acute illness (Duodenitis),  poor appetite, chronic illness(CKDIV) as evidenced by per patient report of no intake since last Thursday and report of chronic anorexia and intermittently going 2-3 days w/o eating  GOAL:  Patient will meet greater than or equal to 90% of their needs  MONITOR:  PO intake, Supplement acceptance, Diet advancement, Labs, Weight trends  REASON FOR ASSESSMENT:  Malnutrition Screening Tool    ASSESSMENT:  62 y/o male PMHx Anxiety, CKDIV, GERD, PAD, COPD, CAD, HLD/HTN, Afib. Presented w/ 3 days of vomiting, abdominal pain and inability to keep any food down. CT consistent with duodenitis. Also found to have  AKI on CKD. Admitted for management/GI consult  History limited as patient is rather poor historian. From what could gather, the patient has had nothing to eat since Thursday, not even water. At baseline he does not take any vitamins or supplements. When asked if he followed any type of diet ie low sodium/renal he says "I use sea salt". He also says he does not eat pork.   Though he has had a severe acute loss in his appetite, he also notes a chronic loss of appetite related to his CKD. He says his appetite has decreased to the point he more or less forces himself to eat and "can go 2-3 days without nothing but a nick-nack". He apparently is planning to start PD relatively soon.   He gives a UBW of 214-218. He is currently 204 lbs. When asked if he thought his weight loss was due to this acute illness, he felt it was more chronic "I think its due to my kidneys". Per chart, his weight has been relatively stable for the past 2 years. He may have had an acute loss of a few lbs in the last couple weeks, difficult to discern.   At this time, his diet  was just advanced to CL. He is starting to gingerale. He was agreeable to [employer].   Will monitor intake and adjust supplements/f/u as needed  Physical Exam: Mild orbital wasting, largely WDL. Subjectively, appears shorter than 6'4 and overweight/obese  Labs: Albumin:2.9, BUN/Creat:44/4.70. Glu:70-110, K: 4.5 Meds: PPI, IVF, PRN Dilaudid  Recent Labs  Lab 04/27/17 1245 04/28/17 0532  NA 138 137  K 4.9 4.5  CL 105 107  CO2 22 19*  BUN 42* 44*  CREATININE 5.20* 4.70*  CALCIUM 8.2* 7.5*  GLUCOSE 104* 77   NUTRITION - FOCUSED PHYSICAL EXAM:   Most Recent Value  Orbital Region  Mild depletion  Upper Arm Region  No depletion  Thoracic and Lumbar Region  No depletion  Buccal Region  No depletion  Temple Region  No depletion  Clavicle Bone Region  No depletion  Clavicle and Acromion Bone Region  No depletion  Scapular Bone Region  No depletion  Dorsal Hand  No depletion  Patellar Region  Unable to assess  Anterior Thigh Region  Unable to assess  Posterior Calf Region  Unable to assess       Diet Order:  Diet clear liquid Room service appropriate? Yes; Fluid consistency: Thin  EDUCATION NEEDS:  No education needs have been identified at this time  Skin:  Skin Assessment: Reviewed RN Assessment  Last BM:  1/12  Height:  Ht Readings from Last 1 Encounters:  04/27/17 6'  4" (1.93 m)   Weight:  Wt Readings from Last 1 Encounters:  04/27/17 204 lb 4.8 oz (92.7 kg)   Wt Readings from Last 10 Encounters:  04/27/17 204 lb 4.8 oz (92.7 kg)  04/08/17 213 lb 4.8 oz (96.8 kg)  02/25/17 208 lb (94.3 kg)  02/20/17 206 lb 9.6 oz (93.7 kg)  01/27/17 195 lb (88.5 kg)  01/21/17 196 lb 9.6 oz (89.2 kg)  12/19/16 202 lb 8 oz (91.9 kg)  01/01/16 198 lb (89.8 kg)  10/12/14 201 lb (91.2 kg)  09/23/14 203 lb 11.3 oz (92.4 kg)   Ideal Body Weight:  91.82 kg(Feel ht overstated)  BMI:  Body mass index is 24.87 kg/m.  Estimated Nutritional Needs:  Kcal:  2050-2300 (22-25  kcal/kg bw) Protein:  75-93 g Pro (.8-1 g/kg bw) Fluid:  2.1-2.3 L fluid ( 22ml/kcal)  Burtis Junes RD, LDN, CNSC Clinical Nutrition Pager: [phone] 04/28/2017 11:21 AM

## 2017-04-29 ENCOUNTER — Encounter (HOSPITAL_COMMUNITY): Payer: Self-pay | Admitting: *Deleted

## 2017-04-29 ENCOUNTER — Encounter (HOSPITAL_COMMUNITY)
Admission: EM | Disposition: A | Discharge status: Home / Self Care | Payer: Self-pay | Source: Home / Self Care | Attending: Internal Medicine

## 2017-04-29 ENCOUNTER — Inpatient Hospital Stay (HOSPITAL_COMMUNITY): Payer: Medicare HMO | Admitting: Anesthesiology

## 2017-04-29 DIAGNOSIS — K3189 Other diseases of stomach and duodenum: Secondary | ICD-10-CM

## 2017-04-29 DIAGNOSIS — K269 Duodenal ulcer, unspecified as acute or chronic, without hemorrhage or perforation: Secondary | ICD-10-CM

## 2017-04-29 DIAGNOSIS — N179 Acute kidney failure, unspecified: Secondary | ICD-10-CM

## 2017-04-29 DIAGNOSIS — R933 Abnormal findings on diagnostic imaging of other parts of digestive tract: Secondary | ICD-10-CM

## 2017-04-29 HISTORY — PX: ESOPHAGOGASTRODUODENOSCOPY (EGD) WITH PROPOFOL: SHX5813

## 2017-04-29 HISTORY — PX: BIOPSY: SHX5522

## 2017-04-29 LAB — BASIC METABOLIC PANEL
ANION GAP: 9 (ref 5–15)
BUN: 46 mg/dL — ABNORMAL HIGH (ref 6–20)
CHLORIDE: 108 mmol/L (ref 101–111)
CO2: 18 mmol/L — ABNORMAL LOW (ref 22–32)
Calcium: 7.5 mg/dL — ABNORMAL LOW (ref 8.9–10.3)
Creatinine, Ser: 4.27 mg/dL — ABNORMAL HIGH (ref 0.61–1.24)
GFR calc Af Amer: 16 mL/min — ABNORMAL LOW (ref >60)
GFR, EST NON AFRICAN AMERICAN: 14 mL/min — AB (ref >60)
GLUCOSE: 89 mg/dL (ref 65–99)
POTASSIUM: 4.6 mmol/L (ref 3.5–5.1)
SODIUM: 135 mmol/L (ref 135–145)

## 2017-04-29 LAB — CBC
HCT: 31.9 % — ABNORMAL LOW (ref 39.0–52.0)
HEMOGLOBIN: 10.6 g/dL — AB (ref 13.0–17.0)
MCH: 30.8 pg (ref 26.0–34.0)
MCHC: 33.2 g/dL (ref 30.0–36.0)
MCV: 92.7 fL (ref 78.0–100.0)
Platelets: 170 10*3/uL (ref 150–400)
RBC: 3.44 MIL/uL — ABNORMAL LOW (ref 4.22–5.81)
RDW: 15.2 % (ref 11.5–15.5)
WBC: 4.3 10*3/uL (ref 4.0–10.5)

## 2017-04-29 SURGERY — ESOPHAGOGASTRODUODENOSCOPY (EGD) WITH PROPOFOL
Anesthesia: Monitor Anesthesia Care

## 2017-04-29 MED ORDER — PROPOFOL 10 MG/ML IV BOLUS
INTRAVENOUS | Status: AC
  Filled 2017-04-29: qty 40

## 2017-04-29 MED ORDER — PROPOFOL 10 MG/ML IV BOLUS
INTRAVENOUS | Status: DC | PRN
  Administered 2017-04-29 (×2): 20 mg via INTRAVENOUS

## 2017-04-29 MED ORDER — MIDAZOLAM HCL 2 MG/2ML IJ SOLN
1.0000 mg | INTRAMUSCULAR | Status: DC
  Administered 2017-04-29: 2 mg via INTRAVENOUS

## 2017-04-29 MED ORDER — WARFARIN - PHARMACIST DOSING INPATIENT
Freq: Every day | Status: DC
  Administered 2017-04-29: 17:00:00

## 2017-04-29 MED ORDER — FENTANYL CITRATE (PF) 100 MCG/2ML IJ SOLN
25.0000 ug | Freq: Once | INTRAMUSCULAR | Status: AC
  Administered 2017-04-29: 25 ug via INTRAVENOUS

## 2017-04-29 MED ORDER — FENTANYL CITRATE (PF) 100 MCG/2ML IJ SOLN
INTRAMUSCULAR | Status: AC
  Filled 2017-04-29: qty 2

## 2017-04-29 MED ORDER — MIDAZOLAM HCL 2 MG/2ML IJ SOLN
INTRAMUSCULAR | Status: AC
  Filled 2017-04-29: qty 2

## 2017-04-29 MED ORDER — PROPOFOL 500 MG/50ML IV EMUL
INTRAVENOUS | Status: DC | PRN
  Administered 2017-04-29: 125 ug/kg/min via INTRAVENOUS

## 2017-04-29 MED ORDER — LIDOCAINE VISCOUS 2 % MT SOLN
OROMUCOSAL | Status: AC
  Filled 2017-04-29: qty 15

## 2017-04-29 MED ORDER — SODIUM CHLORIDE 0.9 % IV SOLN
INTRAVENOUS | Status: DC
  Administered 2017-04-29: 13:00:00 via INTRAVENOUS

## 2017-04-29 MED ORDER — WARFARIN SODIUM 5 MG PO TABS
5.0000 mg | ORAL_TABLET | Freq: Once | ORAL | Status: AC
  Administered 2017-04-29: 5 mg via ORAL
  Filled 2017-04-29: qty 1

## 2017-04-29 MED ORDER — HYDRALAZINE HCL 20 MG/ML IJ SOLN: 10.0000 mg | INTRAMUSCULAR | Status: DC | PRN

## 2017-04-29 MED ORDER — CARVEDILOL 12.5 MG PO TABS
12.5000 mg | ORAL_TABLET | Freq: Once | ORAL | Status: AC
  Administered 2017-04-29: 12.5 mg via ORAL

## 2017-04-29 MED ORDER — SODIUM CHLORIDE 0.9 % IV SOLN: INTRAVENOUS | Status: DC

## 2017-04-29 NOTE — Transfer of Care (Signed)
Immediate Anesthesia Transfer of Care Note  Patient: Aaron [name]  Procedure(s) Performed: ESOPHAGOGASTRODUODENOSCOPY (EGD) WITH PROPOFOL (N/A ) BIOPSY  Patient Location: PACU  Anesthesia Type:MAC  Level of Consciousness: awake, alert  and patient cooperative  Airway & Oxygen Therapy: Patient Spontanous Breathing  Post-op Assessment: Report given to RN and Post -op Vital signs reviewed and stable  Post vital signs: Reviewed and stable  Last Vitals:  Vitals:   04/29/17 1410 04/29/17 1415  BP: (!) 153/85 (!) 153/91  Pulse:    Resp: 14 13  Temp:    SpO2: 95% 96%    Last Pain:  Vitals:   04/29/17 1314  TempSrc: Oral  PainSc:       Patients Stated Pain Goal: 3 (81/15/72 6203)  Complications: No apparent anesthesia complications

## 2017-04-29 NOTE — Anesthesia Postprocedure Evaluation (Signed)
Anesthesia Post Note  Patient: Aaron [name]  Procedure(s) Performed: ESOPHAGOGASTRODUODENOSCOPY (EGD) WITH PROPOFOL (N/A ) BIOPSY  Patient location during evaluation: PACU Anesthesia Type: MAC Level of consciousness: awake and alert Pain management: satisfactory to patient Vital Signs Assessment: post-procedure vital signs reviewed and stable Respiratory status: spontaneous breathing Cardiovascular status: stable Postop Assessment: no apparent nausea or vomiting Anesthetic complications: no     Last Vitals:  Vitals:   04/29/17 1455 04/29/17 1500  BP: (!) 155/84 (!) 152/90  Pulse: 64 60  Resp: 18 16  Temp: 37 C   SpO2: 99% 100%    Last Pain:  Vitals:   04/29/17 1455  TempSrc:   PainSc: 0-No pain                 Meliton Samad

## 2017-04-29 NOTE — Progress Notes (Signed)
ANTICOAGULATION CONSULT NOTE - Follow Up Consult  Pharmacy Consult for warfarin Indication: atrial fibrillation  No Known Allergies  Patient Measurements: Height: 6\' 4"  (193 cm) Weight: 204 lb (92.5 kg) IBW/kg (Calculated) : 86.8  Vital Signs: Temp: 97.5 F (36.4 C) (01/16 1531) Temp Source: Oral (01/16 1531) BP: 171/85 (01/16 1531) Pulse Rate: 52 (01/16 1531)  Labs: Recent Labs    04/27/17 1245 04/28/17 0532 04/29/17 0509  HGB 11.5* 10.1* 10.6*  HCT 35.2* 31.1* 31.9*  PLT 216 170 170  LABPROT 13.0  --   --   INR 0.99  --   --   CREATININE 5.20* 4.70* 4.27*    Estimated Creatinine Clearance: 22.6 mL/min (A) (by C-G formula based on SCr of 4.27 mg/dL (H)).   Medications:  Medications Prior to Admission  Medication Sig Dispense Refill Last Dose  . carvedilol (COREG) 25 MG tablet Take 1 tablet (25 mg total) 2 (two) times daily by mouth. 180 tablet 3 04/29/2017 at 900Atime  . doxazosin (CARDURA) 8 MG tablet Take 8 mg by mouth at bedtime.    04/28/2017 at Unknown time  . guaiFENesin (MUCINEX) 600 MG 12 hr tablet Take 600 mg by mouth daily as needed (congestion).   unknown  . hydrOXYzine (ATARAX/VISTARIL) 25 MG tablet Take 25 mgs by mouth once daily   04/26/2017 at Unknown time  . oxyCODONE-acetaminophen (PERCOCET) 10-325 MG per tablet Take 1 tablet by mouth every 4 (four) hours as needed for pain.    04/26/2017 at Unknown time  . pantoprazole (PROTONIX) 40 MG tablet Take 40 mg daily by mouth.  1 04/26/2017 at Unknown time  . sodium bicarbonate 650 MG tablet Take 1 tablet (650 mg total) by mouth 3 (three) times daily. 120 tablet 1 04/27/2017 at Unknown time  . torsemide (DEMADEX) 20 MG tablet Take 1 tablet (20 mg total) by mouth daily. 30 tablet 0 04/26/2017 at Unknown time  . warfarin (COUMADIN) 5 MG tablet Take 1/2 to 1 tablet by mouth daily as directed by coumadin clinic (Patient taking differently: Take 5 mg by mouth every evening. ) 30 tablet 1 HOLD    Assessment: Patient  hasnt taken warfarin for about 6 weeks since hernia surgery so INR is baseline. Will restart him on previous home dose of Coumadin.  Goal of Therapy:  INR 2-3 Monitor platelets by anticoagulation protocol: Yes   Plan:  Warfarin 5 mg daily x 1. Monitor INR daily, s/s of bleeding, cbc every 3 days   Margot Ables, PharmD Clinical Pharmacist 04/29/2017 3:44 PM

## 2017-04-29 NOTE — Progress Notes (Signed)
Patient seen and examined short stay. Stable overnight. Discussed diagnostic EGD with patient. He denies dysphagia.  The risks, benefits, limitations, alternatives and imponderables have been reviewed with the patient. Potential for esophageal dilation, biopsy, etc. have also been reviewed.  Questions have been answered. All parties agreeable.  Further recommendations to follow pending results of EGD.

## 2017-04-29 NOTE — Op Note (Signed)
[facility] Patient Name: Aaron [name] Procedure Date: 04/29/2017 2:24 PM MRN: [id number] Date of Birth: 11/28/1955 Attending MD: Norvel Richards , MD CSN: [id number] Age: 62 Admit Type: Ambulatory Procedure:                Upper GI endoscopy Indications:              Abnormal CT of the GI tract - duodenum Providers:                Norvel Richards, MD, Lurline Del, RN, Hinton Rao, RN Referring MD:              Medicines:                Propofol per Anesthesia Complications:            No immediate complications. Estimated Blood Loss:     Estimated blood loss was minimal. Procedure:                Pre-Anesthesia Assessment:                           - Prior to the procedure, a History and Physical                            was performed, and patient medications and                            allergies were reviewed. The patient's tolerance of                            previous anesthesia was also reviewed. The risks                            and benefits of the procedure and the sedation                            options and risks were discussed with the patient.                            All questions were answered, and informed consent                            was obtained. Prior Anticoagulants: The patient                            last took Coumadin (warfarin) 3 days prior to the                            procedure. ASA Grade Assessment: III - A patient                            with severe systemic disease. After reviewing the  risks and benefits, the patient was deemed in                            satisfactory condition to undergo the procedure.                           After obtaining informed consent, the endoscope was                            passed under direct vision. Throughout the                            procedure, the patient's blood pressure, pulse, and   oxygen saturations were monitored continuously. The                            [phone]) was introduced through the and                            advanced to the third part of duodenum. The upper                            GI endoscopy was accomplished without difficulty.                            The patient tolerated the procedure well. Scope In: 2:38:59 PM Scope Out: 2:48:27 PM Total Procedure Duration: 0 hours 9 minutes 28 seconds  Findings:      The examined esophagus was normal.      Patchy erythema of the gastric mucosa diffusely. No ulcer or ill       infiltrative process seen. Pylorus patent..      Extensive inflammatory changes beginning in the postebulb and extending       through most of the second portion of the duodenum. Geographic       ulceration and mucosal edema present. This did encroach somewhat on the       lumen, however, the duodenal lumen remained widely patent at this level.       The distal second portion of the duodenunal mucosa also appeared       abnormal with 1-3 mm whitish submucosal lesions in a "polka-dot"       distribution diffusely in the distal second portion. The third portion       of the duodenum appeared entirely normal. Please see photographs.       Multiple biopsies of the abnormal second portion of the duodenum taken. Impression:               - Normal esophagus.                           - Mucosal changes in the stomach of doubtful                            clinical sign.                           - Markedly abnormal second portion of the duodenum  as described?"S/P biopsy. Differential includes                            peptic ulcer disease with atypical features, an                            infiltrating process or ischemia. Moderate Sedation:      Moderate (conscious) sedation was personally administered by an       anesthesia professional. The following parameters were monitored: oxygen        saturation, heart rate, blood pressure, respiratory rate, EKG, adequacy       of pulmonary ventilation, and response to care. Total physician       intraservice time was 18 minutes. Recommendation:           - Return patient to hospital ward for ongoing care.                            . Continue acid suppression therapy. Full liquid                            diet. Follow up on pathology.                           - Full liquid diet.                           - Continue present medications. Procedure Code(s):        --- Professional ---                           [phone], Esophagogastroduodenoscopy, flexible,                            transoral; diagnostic, including collection of                            specimen(s) by brushing or washing, when performed                            (separate procedure) Diagnosis Code(s):        --- Professional ---                           K31.89, Other diseases of stomach and duodenum                           K26.9, Duodenal ulcer, unspecified as acute or                            chronic, without hemorrhage or perforation                           R93.3, Abnormal findings on diagnostic imaging of                            other parts of digestive tract CPT copyright  2016 [employer]. All rights reserved. The codes documented in this report are preliminary and upon coder review may  be revised to meet current compliance requirements. Aaron [name]. Aaron Hillebrand, MD Norvel Richards, MD 04/29/2017 3:06:56 PM This report has been signed electronically. Number of Addenda: 0

## 2017-04-29 NOTE — Progress Notes (Signed)
Patients BP elevated but pulse in the 50's.  Due for 25 of coreg - made MD aware.  Orders given to give only 12.5 mg at this time for one dose.  Order changes made.  Patient asymptomatic at this time, will continue to monitor.

## 2017-04-29 NOTE — Anesthesia Procedure Notes (Signed)
Procedure Name: MAC Date/Time: 04/29/2017 2:27 PM Performed by: Vista Deck, CRNA Pre-anesthesia Checklist: Patient identified, Emergency Drugs available, Suction available, Timeout performed and Patient being monitored Patient Re-evaluated:Patient Re-evaluated prior to induction Oxygen Delivery Method: Non-rebreather mask

## 2017-04-29 NOTE — Anesthesia Preprocedure Evaluation (Signed)
Anesthesia Evaluation  Patient identified by MRN, date of birth, ID band Patient awake    Reviewed: Allergy & Precautions, NPO status , Patient's Chart, lab work & pertinent test results  Airway Mallampati: II  TM Distance: >3 FB     Dental  (+) Edentulous Upper, Edentulous Lower   Pulmonary COPD, former smoker,    breath sounds clear to auscultation       Cardiovascular hypertension, + CAD and + Peripheral Vascular Disease  + dysrhythmias Atrial Fibrillation  Rhythm:Regular Rate:Normal     Neuro/Psych  Headaches, PSYCHIATRIC DISORDERS Anxiety Depression    GI/Hepatic GERD  ,(+) Hepatitis -, C  Endo/Other    Renal/GU ESRFRenal disease     Musculoskeletal   Abdominal   Peds  Hematology  (+) anemia ,   Anesthesia Other Findings   Reproductive/Obstetrics                             Anesthesia Physical Anesthesia Plan  ASA: III  Anesthesia Plan: MAC   Post-op Pain Management:    Induction: Intravenous  PONV Risk Score and Plan:   Airway Management Planned: Simple Face Mask  Additional Equipment:   Intra-op Plan:   Post-operative Plan:   Informed Consent: I have reviewed the patients History and Physical, chart, labs and discussed the procedure including the risks, benefits and alternatives for the proposed anesthesia with the patient or authorized representative who has indicated his/her understanding and acceptance.     Plan Discussed with:   Anesthesia Plan Comments:         Anesthesia Quick Evaluation

## 2017-04-29 NOTE — Progress Notes (Signed)
PROGRESS NOTE    Aaron [name]  JOA:[id number] DOB: June 18, 1955 DOA: 04/27/2017 PCP: Deloria Lair., MD    Brief Narrative:  62 year old male with multiple medical problems, admitted to the hospital with abdominal pain.  CT scan showed possible duodenitis.  Started on PPI, bowel rest advanced to full liquid diet on 1/16 after EGD demonstrating markedly abnormal second portion of duodenum suggestive of PUB, infiltrating process or ischemia. Normal esophagus and some mild mucosal changes in stomach noted.   Also noted to have AK I on CKD, likely related to dehydration/diuretic use.  Started on IV fluids with some improvement.  Assessment & Plan:   Active Problems:   Essential hypertension   PAD (peripheral artery disease) (HCC)   AKI (acute kidney injury) ([place])   CKD (chronic kidney disease), stage V (HCC)   CAD (coronary artery disease)   Ischemic cardiomyopathy   Paroxysmal atrial fibrillation (HCC)   History of hepatitis C   Duodenitis   Schatzki's ring of distal esophagus   1. Duodenitis.  Unclear etiology.  Infectious versus possible ischemic.  Continue on IV Protonix.  GI following s/p EGD today. Diet advanced to fulls.  2. Acute kidney injury on chronic kidney disease stage IV.  Likely related to dehydration/volume depletion.  Creatinine improving with diuresis.  Continue to hold diuretics.  Recheck labs in a.m. 3. Paroxysmal atrial fibrillation.  He reports that he is chronically on Coumadin, but has not taken any Coumadin since his hernia surgery approximately 6 weeks ago.  Can restart Coumadin tonight per pharmacy protocol. 4. Hypertension.  Blood pressures elevated.  Restarted on Coreg and Cardura with good control. 5. Metabolic acidosis.  Related to chronic kidney disease.  Continue on oral bicarbonate supplementation. 6. Ischemic cardiomyopathy.  Echo from 01/2017 done at [facility] shows ejection fraction 40-45%.  Volume status appears to be stable at this time.   Continue to monitor in the setting of hydration. DC IVF with +FB noted.   DVT prophylaxis: SCDs Code Status: Full code Family Communication: No family present Disposition Plan: Discharge home once improved   Consultants:   Gastroenterology  Procedures:   EGD with biopsy 1/16  Antimicrobials:   None.   Subjective: Feeling better.  Abdominal pain improving.  No vomiting.  Objective: Vitals:   04/29/17 1410 04/29/17 1415 04/29/17 1455 04/29/17 1500  BP: (!) 153/85 (!) 153/91 (!) 155/84 (!) 152/90  Pulse:   64 60  Resp: 14 13 18 16   Temp:   98.6 F (37 C)   TempSrc:      SpO2: 95% 96% 99% 100%  Weight:      Height:        Intake/Output Summary (Last 24 hours) at 04/29/2017 1518 Last data filed at 04/29/2017 1449 Gross per 24 hour  Intake 1415 ml  Output -  Net 1415 ml   Filed Weights   04/27/17 1215 04/27/17 2121 04/29/17 1314  Weight: 95.3 kg (210 lb) 92.7 kg (204 lb 4.8 oz) 92.5 kg (204 lb)    Examination:  General exam: Appears calm and comfortable  Respiratory system: Clear to auscultation. Respiratory effort normal. Cardiovascular system: S1 & S2 heard, RRR. No JVD, murmurs, rubs, gallops or clicks. No pedal edema. Gastrointestinal system: Abdomen is nondistended, soft and nontender. No organomegaly or masses felt. Normal bowel sounds heard. Central nervous system: Alert and oriented. No focal neurological deficits. Extremities: Symmetric 5 x 5 power. Skin: No rashes, lesions or ulcers Psychiatry: Judgement and insight appear normal. Mood &  affect appropriate.     Data Reviewed: I have personally reviewed following labs and imaging studies  CBC: Recent Labs  Lab 04/27/17 1245 04/28/17 0532 04/29/17 0509  WBC 8.0 5.8 4.3  HGB 11.5* 10.1* 10.6*  HCT 35.2* 31.1* 31.9*  MCV 92.4 93.1 92.7  PLT 216 170 983   Basic Metabolic Panel: Recent Labs  Lab 04/27/17 1245 04/28/17 0532 04/29/17 0509  NA 138 137 135  K 4.9 4.5 4.6  CL 105 107 108    CO2 22 19* 18*  GLUCOSE 104* 77 89  BUN 42* 44* 46*  CREATININE 5.20* 4.70* 4.27*  CALCIUM 8.2* 7.5* 7.5*   GFR: Estimated Creatinine Clearance: 22.6 mL/min (A) (by C-G formula based on SCr of 4.27 mg/dL (H)). Liver Function Tests: Recent Labs  Lab 04/27/17 1245 04/28/17 0532  AST 10* 10*  ALT 6* 6*  ALKPHOS 88 71  BILITOT 0.8 0.7  PROT 7.0 5.8*  ALBUMIN 3.5 2.9*   Recent Labs  Lab 04/27/17 1245  LIPASE 40   No results for input(s): AMMONIA in the last 168 hours. Coagulation Profile: Recent Labs  Lab 04/27/17 1245  INR 0.99   Cardiac Enzymes: No results for input(s): CKTOTAL, CKMB, CKMBINDEX, TROPONINI in the last 168 hours. BNP (last 3 results) No results for input(s): PROBNP in the last 8760 hours. HbA1C: No results for input(s): HGBA1C in the last 72 hours. CBG: No results for input(s): GLUCAP in the last 168 hours. Lipid Profile: No results for input(s): CHOL, HDL, LDLCALC, TRIG, CHOLHDL, LDLDIRECT in the last 72 hours. Thyroid Function Tests: No results for input(s): TSH, T4TOTAL, FREET4, T3FREE, THYROIDAB in the last 72 hours. Anemia Panel: No results for input(s): VITAMINB12, FOLATE, FERRITIN, TIBC, IRON, RETICCTPCT in the last 72 hours. Sepsis Labs: No results for input(s): PROCALCITON, LATICACIDVEN in the last 168 hours.  No results found for this or any previous visit (from the past 240 hour(s)).       Radiology Studies: Ct Abdomen Pelvis Wo Contrast  Result Date: 04/27/2017 CLINICAL DATA:  Right upper quadrant pain for 4 days. EXAM: CT ABDOMEN AND PELVIS WITHOUT CONTRAST TECHNIQUE: Multidetector CT imaging of the abdomen and pelvis was performed following the standard protocol without IV contrast. COMPARISON:  12/14/2016 FINDINGS: Lower chest: No acute abnormality. Hepatobiliary: Multiple low-attenuation foci are again noted throughout the liver compatible with multiple cysts. Gallbladder appears normal. Pancreas: Unremarkable. No pancreatic  ductal dilatation or surrounding inflammatory changes. Spleen: Normal in size without focal abnormality. Adrenals/Urinary Tract: The adrenal glands are normal. Bilateral nephromegaly with innumerable cysts. The cysts are of varying size and densities, and are incompletely characterized without IV contrast. Multiple left renal calculi. No hydronephrosis or hydroureter. Urinary mild diffuse bladder wall thickening identified with calcification. Stomach/Bowel: The stomach appears normal. There is abnormal wall thickening involving the the pylorus and first portion of the duodenum. The wall thickness measures up to 1.1 cm and there is surrounding soft tissue stranding as well as retroperitoneal free fluid. No evidence for bowel perforation. No free intraperitoneal air. The remaining small bowel loops are unremarkable. The appendix is not visualized. Distal colonic diverticula without acute inflammation. Vascular/Lymphatic: Aortic atherosclerosis. No aneurysm. No upper abdominal adenopathy. No pelvic or inguinal adenopathy. Reproductive: Prostate is unremarkable. Other: No abdominal wall hernia or abnormality. Musculoskeletal: There is degenerative disc disease identified within the lumbar spine. Attempted interbody fusion at L2-3 and L3-4 identified. The disc spaces remain distinct compatible with nonunion. Marked L5-S1 degenerative disc disease noted. IMPRESSION: 1. Wall  thickening and inflammation with a free-fluid is noted involving the proximal duodenum. Findings are worrisome for duodenitis. No evidence for perforation. No gastric outlet obstruction identified. 2.  Aortic Atherosclerosis (ICD10-I70.0). 3. Lumbar spondylosis status post attempted interbody fusion of L2-3 and L3-4. 4. Changes of polycystic kidney disease. Nonobstructing left renal calculi again noted. Electronically Signed   By: Kerby Moors M.D.   On: 04/27/2017 17:07        Scheduled Meds: . [MAR Hold] carvedilol  25 mg Oral BID WC  .  [MAR Hold] doxazosin  8 mg Oral QHS  . [MAR Hold] feeding supplement  1 Container Oral BID BM  . [MAR Hold] pantoprazole (PROTONIX) IV  40 mg Intravenous Q12H  . [MAR Hold] sodium bicarbonate  650 mg Oral TID   Continuous Infusions: . sodium chloride 75 mL/hr at 04/29/17 0500  . sodium chloride 10 mL/hr at 04/29/17 1325     LOS: 2 days    Time spent: 18mins    Kemal Amores Darleen Crocker, DO [facility] Pager [phone]  If 7PM-7AM, please contact night-coverage [email or web address] Password [facility] 04/29/2017, 3:18 PM

## 2017-04-30 ENCOUNTER — Encounter (HOSPITAL_COMMUNITY): Payer: Self-pay | Admitting: Internal Medicine

## 2017-04-30 DIAGNOSIS — K2980 Duodenitis without bleeding: Principal | ICD-10-CM

## 2017-04-30 LAB — BASIC METABOLIC PANEL
Anion gap: 8 (ref 5–15)
BUN: 38 mg/dL — ABNORMAL HIGH (ref 6–20)
CALCIUM: 7.7 mg/dL — AB (ref 8.9–10.3)
CHLORIDE: 106 mmol/L (ref 101–111)
CO2: 20 mmol/L — ABNORMAL LOW (ref 22–32)
Creatinine, Ser: 4.47 mg/dL — ABNORMAL HIGH (ref 0.61–1.24)
GFR calc non Af Amer: 13 mL/min — ABNORMAL LOW (ref >60)
GFR, EST AFRICAN AMERICAN: 15 mL/min — AB (ref >60)
Glucose, Bld: 94 mg/dL (ref 65–99)
Potassium: 5.2 mmol/L — ABNORMAL HIGH (ref 3.5–5.1)
SODIUM: 134 mmol/L — AB (ref 135–145)

## 2017-04-30 LAB — PROTIME-INR
INR: 1.04
PROTHROMBIN TIME: 13.5 s (ref 11.4–15.2)

## 2017-04-30 LAB — CBC
HCT: 31.1 % — ABNORMAL LOW (ref 39.0–52.0)
Hemoglobin: 10.2 g/dL — ABNORMAL LOW (ref 13.0–17.0)
MCH: 30.4 pg (ref 26.0–34.0)
MCHC: 32.8 g/dL (ref 30.0–36.0)
MCV: 92.8 fL (ref 78.0–100.0)
PLATELETS: 191 10*3/uL (ref 150–400)
RBC: 3.35 MIL/uL — AB (ref 4.22–5.81)
RDW: 15.4 % (ref 11.5–15.5)
WBC: 4.9 10*3/uL (ref 4.0–10.5)

## 2017-04-30 MED ORDER — PANTOPRAZOLE SODIUM 40 MG PO TBEC: 40.0000 mg | DELAYED_RELEASE_TABLET | Freq: Two times a day (BID) | ORAL | 1 refills | Status: AC

## 2017-04-30 MED ORDER — ONDANSETRON HCL 4 MG PO TABS: 4.0000 mg | ORAL_TABLET | Freq: Four times a day (QID) | ORAL | 0 refills | Status: DC | PRN

## 2017-04-30 NOTE — Anesthesia Postprocedure Evaluation (Signed)
Anesthesia Post Note  Patient: Aaron [name]  Procedure(s) Performed: ESOPHAGOGASTRODUODENOSCOPY (EGD) WITH PROPOFOL (N/A ) BIOPSY  Patient location during evaluation: Nursing Unit Anesthesia Type: MAC Level of consciousness: awake and alert and patient cooperative Pain management: satisfactory to patient Vital Signs Assessment: post-procedure vital signs reviewed and stable Respiratory status: spontaneous breathing Cardiovascular status: stable Postop Assessment: no apparent nausea or vomiting Anesthetic complications: no     Last Vitals:  Vitals:   04/29/17 2139 04/30/17 0625  BP:  (!) 157/71  Pulse:  60  Resp:  18  Temp:  36.7 C  SpO2: 98% 97%    Last Pain:  Vitals:   04/30/17 0953  TempSrc:   PainSc: 2                  Terance Pomplun

## 2017-04-30 NOTE — Progress Notes (Signed)
Subjective:  Feels better. Still with pp bloating. No vomiting or pain.  Objective: Vital signs in last 24 hours: Temp:  [97.5 F (36.4 C)-98.6 F (37 C)] 98.1 F (36.7 C) (01/17 0625) Pulse Rate:  [49-64] 60 (01/17 0625) Resp:  [13-22] 18 (01/17 0625) BP: (152-178)/(71-101) 157/71 (01/17 0625) SpO2:  [92 %-100 %] 97 % (01/17 0625) Weight:  [204 lb (92.5 kg)] 204 lb (92.5 kg) (01/16 1314) Last BM Date: 04/28/17 General:   Alert,  Well-developed, well-nourished, pleasant and cooperative in NAD Head:  Normocephalic and atraumatic. Eyes:  Sclera clear, no icterus.  Abdomen:  Soft, nontender and nondistended  Normal bowel sounds, without guarding, and without rebound.   Extremities:  Without clubbing, deformity or edema. Neurologic:  Alert and  oriented x4;  grossly normal neurologically. Skin:  Intact without significant lesions or rashes. Psych:  Alert and cooperative. Normal mood and affect.  Intake/Output from previous day: 01/16 0701 - 01/17 0700 In: 363.8 [I.V.:363.8] Out: -  Intake/Output this shift: No intake/output data recorded.  Lab Results: CBC Recent Labs    04/28/17 0532 04/29/17 0509 04/30/17 0614  WBC 5.8 4.3 4.9  HGB 10.1* 10.6* 10.2*  HCT 31.1* 31.9* 31.1*  MCV 93.1 92.7 92.8  PLT 170 170 191   BMET Recent Labs    04/28/17 0532 04/29/17 0509 04/30/17 0614  NA 137 135 134*  K 4.5 4.6 5.2*  CL 107 108 106  CO2 19* 18* 20*  GLUCOSE 77 89 94  BUN 44* 46* 38*  CREATININE 4.70* 4.27* 4.47*  CALCIUM 7.5* 7.5* 7.7*   LFTs Recent Labs    04/27/17 1245 04/28/17 0532  BILITOT 0.8 0.7  ALKPHOS 88 71  AST 10* 10*  ALT 6* 6*  PROT 7.0 5.8*  ALBUMIN 3.5 2.9*   Recent Labs    04/27/17 1245  LIPASE 40   PT/INR Recent Labs    04/27/17 1245 04/30/17 0614  LABPROT 13.0 13.5  INR 0.99 1.04      Imaging Studies: Ct Abdomen Pelvis Wo Contrast  Result Date: 04/27/2017 CLINICAL DATA:  Right upper quadrant pain for 4 days. EXAM: CT  ABDOMEN AND PELVIS WITHOUT CONTRAST TECHNIQUE: Multidetector CT imaging of the abdomen and pelvis was performed following the standard protocol without IV contrast. COMPARISON:  12/14/2016 FINDINGS: Lower chest: No acute abnormality. Hepatobiliary: Multiple low-attenuation foci are again noted throughout the liver compatible with multiple cysts. Gallbladder appears normal. Pancreas: Unremarkable. No pancreatic ductal dilatation or surrounding inflammatory changes. Spleen: Normal in size without focal abnormality. Adrenals/Urinary Tract: The adrenal glands are normal. Bilateral nephromegaly with innumerable cysts. The cysts are of varying size and densities, and are incompletely characterized without IV contrast. Multiple left renal calculi. No hydronephrosis or hydroureter. Urinary mild diffuse bladder wall thickening identified with calcification. Stomach/Bowel: The stomach appears normal. There is abnormal wall thickening involving the the pylorus and first portion of the duodenum. The wall thickness measures up to 1.1 cm and there is surrounding soft tissue stranding as well as retroperitoneal free fluid. No evidence for bowel perforation. No free intraperitoneal air. The remaining small bowel loops are unremarkable. The appendix is not visualized. Distal colonic diverticula without acute inflammation. Vascular/Lymphatic: Aortic atherosclerosis. No aneurysm. No upper abdominal adenopathy. No pelvic or inguinal adenopathy. Reproductive: Prostate is unremarkable. Other: No abdominal wall hernia or abnormality. Musculoskeletal: There is degenerative disc disease identified within the lumbar spine. Attempted interbody fusion at L2-3 and L3-4 identified. The disc spaces remain distinct compatible  with nonunion. Marked L5-S1 degenerative disc disease noted. IMPRESSION: 1. Wall thickening and inflammation with a free-fluid is noted involving the proximal duodenum. Findings are worrisome for duodenitis. No evidence for  perforation. No gastric outlet obstruction identified. 2.  Aortic Atherosclerosis (ICD10-I70.0). 3. Lumbar spondylosis status post attempted interbody fusion of L2-3 and L3-4. 4. Changes of polycystic kidney disease. Nonobstructing left renal calculi again noted. Electronically Signed   By: Kerby Moors M.D.   On: 04/27/2017 17:07  [2 weeks]   Assessment: 62 year old male presenting with abdominal pain, N/V, now clinically improved with supportive measures, with non-contrast CT noting duodenitis. EGD fairly recent in Sept 2018 with mild Schatzki's ring s/p dilation, small hiatal hernia, erosive gastropathy and path negative for H.pylori. He has been avoiding NSAIDs and remains on Protonix daily.    EGD yesterday showed some patchy erythema of the gastric mucosa diffusely.  Extensive inflammatory changes beginning in the post bulb and extending through most of the second portion duodenum.  A geographic ulceration and mucosal edema present.  Did encroach somewhat on the lumen however duodenal lumen remain widely patent.  Distal second portion duodenum mucosa also appeared abnormal with 1-3 mm whitish submucosal lesions seen.  Third portion of the duodenum appeared normal.  Multiple biopsies taken.  Clinically he feels better.  Continues to have postprandial bloating but no vomiting.  Consumed full liquid breakfast with bloating.  Plan: 1. Continue PPI twice daily. 2. Follow-up pending path. 3. Continue full liquid diet. When tolerating diet and symptoms managed, ok for discharge from gi standpoint. 4. Mesenteric duplex as an outpatient to assess vasculature given prior colonic ulcers as previously noted.   Laureen Ochs. Bernarda Caffey [facility] [phone] 1/17/20199:57 AM     LOS: 3 days

## 2017-04-30 NOTE — Progress Notes (Signed)
Patient discharging home.  IV removed - WNL.  Reviewed AVS and medications.  Verbalizes understanding.  Patient states he will make follow up apt with PCP, apt already in place for GI F/U.  No questions at this time.  Patient in  NAD.

## 2017-04-30 NOTE — Discharge Summary (Signed)
Physician Discharge Summary  Aaron [name] OXB:[id number] DOB: 02/26/1956 DOA: 04/27/2017  PCP: Deloria Lair., MD  Admit date: 04/27/2017  Discharge date: 04/30/2017  Admitted From:Home  Disposition:  Home  Recommendations for Outpatient Follow-up:  1. Follow up with PCP in 5 days with INR management 2. Follow up with GI in 2 weeks with pathology from EGD along with further possible imaging as suggested  Home Health:N/A  Equipment/Devices:None  Discharge Condition:Stable  CODE STATUS: Full  Diet recommendation: Heart Healthy  Brief/Interim Summary:  62 year old male with multiple medical problems, admitted to the hospital with abdominal pain.  CT scan showed possible duodenitis.  Started on PPI, bowel rest advanced to full liquid diet on 1/16 after EGD demonstrating markedly abnormal second portion of duodenum suggestive of PUD, infiltrating process or ischemia. Normal esophagus and some mild mucosal changes in stomach noted.   Also noted to have AK I on CKD, likely related to dehydration/diuretic use.  Started on IV fluids with improvement.  He is now tolerating a full liquid diet with no further nausea or vomiting and has been recommended to remain on PPI twice daily with close follow-up to gastroenterology to review biopsy and consider further imaging with mesenteric duplex to assess vasculature given prior colonic ulcers.  1. Duodenitis.  Unclear etiology.  Infectious versus possible ischemic.  Continue on Protonix BID.  Diet advanced to fulls. GI to follow up path outpatient along with further possible imaging as needed. 2. Acute kidney injury on chronic kidney disease stage IV.  Likely related to dehydration/volume depletion. Creatinine to 4.47 on DC from 4.7 on admission. 3. Paroxysmal atrial fibrillation.  He reports that he is chronically on Coumadin, but has not taken any Coumadin since his hernia surgery approximately 6 weeks ago.  Coumadin restarted here with INR 1.04 on  DC. Will need PCP follow up in about 5 days. 4. Hypertension.  Blood pressures elevated.  Restarted on Coreg and Cardura with good control. 5. Metabolic acidosis.  Related to chronic kidney disease.  Continue on oral bicarbonate supplementation. 6. Ischemic cardiomyopathy.  Echo from 01/2017 done at [facility] shows ejection fraction 40-45%.  Volume status appears to be stable at this time.     Discharge Diagnoses:  Active Problems:   Essential hypertension   PAD (peripheral artery disease) (HCC)   AKI (acute kidney injury) ([place])   CKD (chronic kidney disease), stage V (HCC)   CAD (coronary artery disease)   Ischemic cardiomyopathy   Paroxysmal atrial fibrillation (HCC)   History of hepatitis C   Duodenitis   Schatzki's ring of distal esophagus    Discharge Instructions  Discharge Instructions    Call MD for:  persistant nausea and vomiting   Complete by:  As directed    Call MD for:  severe uncontrolled pain   Complete by:  As directed    Diet - low sodium heart healthy   Complete by:  As directed    Increase activity slowly   Complete by:  As directed      Allergies as of 04/30/2017   No Known Allergies     Medication List    TAKE these medications   carvedilol 25 MG tablet Commonly known as:  COREG Take 1 tablet (25 mg total) 2 (two) times daily by mouth.   doxazosin 8 MG tablet Commonly known as:  CARDURA Take 8 mg by mouth at bedtime.   guaiFENesin 600 MG 12 hr tablet Commonly known as:  MUCINEX Take 600 mg  by mouth daily as needed (congestion).   hydrOXYzine 25 MG tablet Commonly known as:  ATARAX/VISTARIL Take 25 mgs by mouth once daily   ondansetron 4 MG tablet Commonly known as:  ZOFRAN Take 1 tablet (4 mg total) by mouth every 6 (six) hours as needed for nausea.   oxyCODONE-acetaminophen 10-325 MG tablet Commonly known as:  PERCOCET Take 1 tablet by mouth every 4 (four) hours as needed for pain.   pantoprazole 40 MG tablet Commonly known  as:  PROTONIX Take 1 tablet (40 mg total) by mouth 2 (two) times daily. What changed:  when to take this   sodium bicarbonate 650 MG tablet Take 1 tablet (650 mg total) by mouth 3 (three) times daily.   torsemide 20 MG tablet Commonly known as:  DEMADEX Take 1 tablet (20 mg total) by mouth daily.   warfarin 5 MG tablet Commonly known as:  COUMADIN Take as directed. If you are unsure how to take this medication, talk to your nurse or doctor. Original instructions:  Take 1/2 to 1 tablet by mouth daily as directed by coumadin clinic What changed:    how much to take  how to take this  when to take this  additional instructions      Follow-up Information    Deloria Lair., MD Follow up in 5 day(s).   Specialty:  Family Medicine Why:  hospital follow up and monitor INR level with restart on Coumadin Contact information: [address] [place] [address] [phone]        Danie Binder, MD Follow up in 2 week(s).   Specialty:  Gastroenterology Why:  f/u hospitalization s/p EGD here Contact information: [address] [place] [address] [phone]          No Known Allergies  Consultations:  GI   Procedures/Studies: Ct Abdomen Pelvis Wo Contrast  Result Date: 04/27/2017 CLINICAL DATA:  Right upper quadrant pain for 4 days. EXAM: CT ABDOMEN AND PELVIS WITHOUT CONTRAST TECHNIQUE: Multidetector CT imaging of the abdomen and pelvis was performed following the standard protocol without IV contrast. COMPARISON:  12/14/2016 FINDINGS: Lower chest: No acute abnormality. Hepatobiliary: Multiple low-attenuation foci are again noted throughout the liver compatible with multiple cysts. Gallbladder appears normal. Pancreas: Unremarkable. No pancreatic ductal dilatation or surrounding inflammatory changes. Spleen: Normal in size without focal abnormality. Adrenals/Urinary Tract: The adrenal glands are normal. Bilateral nephromegaly with innumerable cysts. The  cysts are of varying size and densities, and are incompletely characterized without IV contrast. Multiple left renal calculi. No hydronephrosis or hydroureter. Urinary mild diffuse bladder wall thickening identified with calcification. Stomach/Bowel: The stomach appears normal. There is abnormal wall thickening involving the the pylorus and first portion of the duodenum. The wall thickness measures up to 1.1 cm and there is surrounding soft tissue stranding as well as retroperitoneal free fluid. No evidence for bowel perforation. No free intraperitoneal air. The remaining small bowel loops are unremarkable. The appendix is not visualized. Distal colonic diverticula without acute inflammation. Vascular/Lymphatic: Aortic atherosclerosis. No aneurysm. No upper abdominal adenopathy. No pelvic or inguinal adenopathy. Reproductive: Prostate is unremarkable. Other: No abdominal wall hernia or abnormality. Musculoskeletal: There is degenerative disc disease identified within the lumbar spine. Attempted interbody fusion at L2-3 and L3-4 identified. The disc spaces remain distinct compatible with nonunion. Marked L5-S1 degenerative disc disease noted. IMPRESSION: 1. Wall thickening and inflammation with a free-fluid is noted involving the proximal duodenum. Findings are worrisome for duodenitis. No evidence for perforation. No  gastric outlet obstruction identified. 2.  Aortic Atherosclerosis (ICD10-I70.0). 3. Lumbar spondylosis status post attempted interbody fusion of L2-3 and L3-4. 4. Changes of polycystic kidney disease. Nonobstructing left renal calculi again noted. Electronically Signed   By: Kerby Moors M.D.   On: 04/27/2017 17:07     Discharge Exam: Vitals:   04/29/17 2139 04/30/17 0625  BP:  (!) 157/71  Pulse:  60  Resp:  18  Temp:  98.1 F (36.7 C)  SpO2: 98% 97%   Vitals:   04/29/17 1531 04/29/17 2110 04/29/17 2139 04/30/17 0625  BP: (!) 171/85 (!) 170/87  (!) 157/71  Pulse: (!) 52 (!) 54  60   Resp: 18 18  18   Temp: (!) 97.5 F (36.4 C) 97.7 F (36.5 C)  98.1 F (36.7 C)  TempSrc: Oral Oral  Oral  SpO2: 98% 100% 98% 97%  Weight:      Height:        General: Pt is alert, awake, not in acute distress Cardiovascular: RRR, S1/S2 +, no rubs, no gallops Respiratory: CTA bilaterally, no wheezing, no rhonchi Abdominal: Soft, NT, ND, bowel sounds + Extremities: no edema, no cyanosis    The results of significant diagnostics from this hospitalization (including imaging, microbiology, ancillary and laboratory) are listed below for reference.     Microbiology: No results found for this or any previous visit (from the past 240 hour(s)).   Labs: BNP (last 3 results) Recent Labs    02/25/17 1229  BNP 951.8*   Basic Metabolic Panel: Recent Labs  Lab 04/27/17 1245 04/28/17 0532 04/29/17 0509 04/30/17 0614  NA 138 137 135 134*  K 4.9 4.5 4.6 5.2*  CL 105 107 108 106  CO2 22 19* 18* 20*  GLUCOSE 104* 77 89 94  BUN 42* 44* 46* 38*  CREATININE 5.20* 4.70* 4.27* 4.47*  CALCIUM 8.2* 7.5* 7.5* 7.7*   Liver Function Tests: Recent Labs  Lab 04/27/17 1245 04/28/17 0532  AST 10* 10*  ALT 6* 6*  ALKPHOS 88 71  BILITOT 0.8 0.7  PROT 7.0 5.8*  ALBUMIN 3.5 2.9*   Recent Labs  Lab 04/27/17 1245  LIPASE 40   No results for input(s): AMMONIA in the last 168 hours. CBC: Recent Labs  Lab 04/27/17 1245 04/28/17 0532 04/29/17 0509 04/30/17 0614  WBC 8.0 5.8 4.3 4.9  HGB 11.5* 10.1* 10.6* 10.2*  HCT 35.2* 31.1* 31.9* 31.1*  MCV 92.4 93.1 92.7 92.8  PLT 216 170 170 191   Cardiac Enzymes: No results for input(s): CKTOTAL, CKMB, CKMBINDEX, TROPONINI in the last 168 hours. BNP: Invalid input(s): POCBNP CBG: No results for input(s): GLUCAP in the last 168 hours. D-Dimer No results for input(s): DDIMER in the last 72 hours. Hgb A1c No results for input(s): HGBA1C in the last 72 hours. Lipid Profile No results for input(s): CHOL, HDL, LDLCALC, TRIG, CHOLHDL,  LDLDIRECT in the last 72 hours. Thyroid function studies No results for input(s): TSH, T4TOTAL, T3FREE, THYROIDAB in the last 72 hours.  Invalid input(s): FREET3 Anemia work up No results for input(s): VITAMINB12, FOLATE, FERRITIN, TIBC, IRON, RETICCTPCT in the last 72 hours. Urinalysis    Component Value Date/Time   COLORURINE YELLOW 04/27/2017 1216   APPEARANCEUR CLEAR 04/27/2017 1216   LABSPEC 1.000 (L) 04/27/2017 1216   PHURINE 6.0 04/27/2017 1216   GLUCOSEU NEGATIVE 04/27/2017 1216   HGBUR SMALL (A) 04/27/2017 1216   BILIRUBINUR NEGATIVE 04/27/2017 1216   KETONESUR NEGATIVE 04/27/2017 1216   PROTEINUR 100 (A) 04/27/2017  1216   UROBILINOGEN 0.2 03/03/2010 1056   NITRITE NEGATIVE 04/27/2017 1216   LEUKOCYTESUR NEGATIVE 04/27/2017 1216   Sepsis Labs Invalid input(s): PROCALCITONIN,  WBC,  LACTICIDVEN Microbiology No results found for this or any previous visit (from the past 240 hour(s)).   Time coordinating discharge: Over 30 minutes  SIGNED:   Rodena Goldmann, DO [facility] 04/30/2017, 11:54 AM Pager [phone]  If 7PM-7AM, please contact night-coverage [email or web address] Password [email or web address]

## 2017-04-30 NOTE — Addendum Note (Signed)
Addendum  created 04/30/17 1115 by Vista Deck, CRNA   Sign clinical note

## 2017-05-01 ENCOUNTER — Ambulatory Visit: Payer: 59 | Admitting: Cardiology

## 2017-05-09 ENCOUNTER — Encounter: Payer: Self-pay | Admitting: Internal Medicine

## 2017-05-09 NOTE — Progress Notes (Signed)
.  rmr 

## 2017-05-25 ENCOUNTER — Encounter: Payer: Self-pay | Admitting: *Deleted

## 2017-05-25 ENCOUNTER — Ambulatory Visit (INDEPENDENT_AMBULATORY_CARE_PROVIDER_SITE_OTHER): Payer: Medicare HMO | Admitting: Gastroenterology

## 2017-05-25 ENCOUNTER — Encounter: Payer: Self-pay | Admitting: Gastroenterology

## 2017-05-25 VITALS — BP 157/96 | HR 70 | Temp 97.7°F | Ht 76.0 in | Wt 195.2 lb

## 2017-05-25 DIAGNOSIS — K2980 Duodenitis without bleeding: Secondary | ICD-10-CM | POA: Diagnosis not present

## 2017-05-25 DIAGNOSIS — R1010 Upper abdominal pain, unspecified: Secondary | ICD-10-CM

## 2017-05-25 NOTE — Progress Notes (Signed)
Primary Care Physician:  Carin Primrose, MD Primary GI: Dr. Oneida Alar   Chief Complaint  Patient presents with  . Hepatitis C    f/u.  Marland Kitchen Abdominal Pain    right side  . Anorexia    HPI:   Aaron [name] is a 62 y.o. male presenting today with a history of positive Hep C antibody but negative RNA. Colonoscopy last year with dense left-sided diverticulosis, right colon ulcers s/p biopsy query occult NSAID use vs transient ischemia, not consistent with IBD. CMV stains negative, and EGD with mild Schatzki's ring s/p dilatation, small hiatal hernia, erosive gastropathy. Used to take a large amount of Ibuprofen and BC powders daily. Was inpatient Jan 2019 with duodenitis. EGD with patchy erythema of the gastric mucosa diffusely.  Extensive inflammatory changes beginning in the post bulb and extending through most of the second portion duodenum.  A geographic ulceration and mucosal edema present.  Did encroach somewhat on the lumen however duodenal lumen remain widely patent.  Distal second portion duodenum mucosa also appeared abnormal with 1-3 mm whitish submucosal lesions seen.  Third portion of the duodenum appeared normal.  Multiple biopsies taken. Path: peptic duodenitis with ulceration.   Per Dr. Thornton Papas with radiology after reviewing CT scan from Jan 2019: celiac axis possibly 50% stenosed, SMA open, IMA without plaque. He still needs mesenteric duplex to evaluate vasculature as he is not able to do a CTA or MRA due to chronic kidney disease. Weight fluctuating. Highest weight recently 213 in Dec 2018, now 195.   No appetite. Smells food and feels nauseated. Food aversion noted. Pain noted after eating. Feels like food sometimes digests, sometimes doesn't (pointing to upper abdomen). Milk soothes his stomach but he throws this up curdled. Intermittent N/V after eating, regardless of texture. Protonix once daily.   Outside labs from Jan 2019: Hgb 11.4, Hct 34.6, BUN 36, Cr 5, GFR 12, Tbili  0.2, Alk phos 97, AST 10, ALT  Past Medical History:  Diagnosis Date  . Anxiety   . Arthritis    knees , back , shoulders  . CAD (coronary artery disease)    Mild nonobstructive disease at cardiac catheterization 2002  . Chronic back pain   . CKD (chronic kidney disease), stage II   . COPD (chronic obstructive pulmonary disease) ([place])   . Diverticulitis   . Esophageal reflux   . Essential hypertension   . Hepatitis C   . History of syncope   . Hyperlipidemia   . Migraines   . Nephrolithiasis   . PAT (paroxysmal atrial tachycardia) ([place])   . Peripheral arterial disease (HCC)    Occluded left superficial femoral artery status post stent June 2016 - Dr. Trula Slade  . Pneumonia    as child   72 years old  . Polycystic kidney, unspecified type   . Syncope 09/2014    Past Surgical History:  Procedure Laterality Date  . BACK SURGERY    . BIOPSY  12/17/2016   Procedure: BIOPSY;  Surgeon: Daneil Dolin, MD;  [facility]: [facility];  Service: Gastroenterology;;  gastric colon  . BIOPSY  04/29/2017   Procedure: BIOPSY;  Surgeon: Daneil Dolin, MD;  Location: [facility];  Service: Endoscopy;;  duodenal biopsies  . BUNIONECTOMY    . CERVICAL SPINE SURGERY    . COLONOSCOPY  2008   Dr. Oneida Alar: rare sigmoid colon diverticulosis, internal hemorrhoids.   . COLONOSCOPY WITH PROPOFOL N/A 12/17/2016   dense  left-sided diverticulosis, right colon ulcers s/p biopsy query occult NSAID use vs transient ischemia, not consistent with IBD. CMV stains negative.   . ESOPHAGEAL DILATION  12/17/2016   EGD with mild Schatzki's ring s/p dilatation, small hiatal hernia, erosive gastropathy (negative H.pylori gastritis)  . ESOPHAGOGASTRODUODENOSCOPY  2008   Dr. Oneida Alar: normal esophagus without Barrett's, antritis and duodenitis, path with H.pylori gastritis  . ESOPHAGOGASTRODUODENOSCOPY (EGD) WITH PROPOFOL N/A 12/17/2016   Procedure: ESOPHAGOGASTRODUODENOSCOPY (EGD) WITH PROPOFOL;  Surgeon: Daneil Dolin,  MD;  Location: [facility];  Service: Gastroenterology;  Laterality: N/A;  . ESOPHAGOGASTRODUODENOSCOPY (EGD) WITH PROPOFOL N/A 04/29/2017   Patchy erythema of gastric mucosa diffusely, extensive inflammatory changes in duodenum, geographic ulceration and mucosal edema present, encroaching somewhat on the lumen yet still widely patent, distal second portion of duodenum appeared abnormal, path with peptic duodenitis with ulceration  . HERNIA REPAIR     umbilical  . laparoscopic inguinal hernia right  02/2017   [facility]  . PERIPHERAL VASCULAR CATHETERIZATION N/A 09/20/2014   Procedure: Abdominal Aortogram;  Surgeon: Serafina Mitchell, MD;  Location: [facility];  Service: Cardiovascular;  Laterality: N/A;    Current Outpatient Medications  Medication Sig Dispense Refill  . carvedilol (COREG) 25 MG tablet Take 1 tablet (25 mg total) 2 (two) times daily by mouth. 180 tablet 3  . doxazosin (CARDURA) 8 MG tablet Take 8 mg by mouth at bedtime.     . hydrOXYzine (ATARAX/VISTARIL) 25 MG tablet Take 25 mgs by mouth once daily    . ondansetron (ZOFRAN) 4 MG tablet Take 1 tablet (4 mg total) by mouth every 6 (six) hours as needed for nausea. 20 tablet 0  . oxyCODONE-acetaminophen (PERCOCET) 10-325 MG per tablet Take 1 tablet by mouth every 4 (four) hours as needed for pain.     . pantoprazole (PROTONIX) 40 MG tablet Take 1 tablet (40 mg total) by mouth 2 (two) times daily. 60 tablet 1  . torsemide (DEMADEX) 20 MG tablet Take 1 tablet (20 mg total) by mouth daily. 30 tablet 0  . warfarin (COUMADIN) 5 MG tablet Take 1/2 to 1 tablet by mouth daily as directed by coumadin clinic (Patient taking differently: Take 5 mg by mouth every evening. ) 30 tablet 1  . guaiFENesin (MUCINEX) 600 MG 12 hr tablet Take 600 mg by mouth daily as needed (congestion).    . sodium bicarbonate 650 MG tablet Take 1 tablet (650 mg total) by mouth 3 (three) times daily. (Patient not taking: Reported on 05/25/2017) 120 tablet 1    No current facility-administered medications for this visit.     Allergies as of 05/25/2017  . (No Known Allergies)    Family History  Problem Relation Age of Onset  . Alcoholism Mother   . Heart disease Father        Massive heart attack  . Heart attack Father   . Atrial fibrillation Father   . Colon cancer Father   . Colon cancer Maternal Grandfather 4  . Alcoholism Maternal Grandfather   . Renal cancer Cousin   . Ovarian cancer Sister     Social History   Socioeconomic History  . Marital status: Legally Separated    Spouse name: None  . Number of children: 2  . Years of education: None  . Highest education level: None  Social Needs  . Financial resource strain: None  . Food insecurity - worry: None  . Food insecurity - inability: None  . Transportation needs -  medical: None  . Transportation needs - non-medical: None  Occupational History  . Occupation: DISABLED  Tobacco Use  . Smoking status: Former Smoker    Packs/day: 0.50    Years: 47.00    Pack years: 23.50    Types: Cigarettes  . Smokeless tobacco: Former Systems developer    Types: Snuff, Chew  Substance and Sexual Activity  . Alcohol use: No    Alcohol/week: 0.0 oz  . Drug use: No    Comment: states in remote past he used "whatever was around"  . Sexual activity: None  Other Topics Concern  . None  Social History Narrative   Disabled from Back since 2007    Review of Systems: Gen: Denies fever, chills, anorexia. Denies fatigue, weakness, weight loss.  CV: Denies chest pain, palpitations, syncope, peripheral edema, and claudication. Resp: Denies dyspnea at rest, cough, wheezing, coughing up blood, and pleurisy. GI: see HPI  Derm: Denies rash, itching, dry skin Psych: Denies depression, anxiety, memory loss, confusion. No homicidal or suicidal ideation.  Heme: Denies bruising, bleeding, and enlarged lymph nodes.  Physical Exam: BP (!) 157/96   Pulse 70   Temp 97.7 F (36.5 C) (Oral)   Ht '6\' 4"'   (1.93 m)   Wt 195 lb 3.2 oz (88.5 kg)   BMI 23.76 kg/m  General:   Alert and oriented. No distress noted. Pleasant and cooperative.  Head:  Normocephalic and atraumatic. Eyes:  Conjuctiva clear without scleral icterus. Mouth:  Oral mucosa pink and moist. Good dentition. No lesions. Abdomen:  +BS, soft, non-tender and non-distended. No rebound or guarding. No HSM or masses noted. Msk:  Symmetrical without gross deformities. Normal posture. Extremities:  Without edema. Neurologic:  Alert and  oriented x4 Psych:  Alert and cooperative. Normal mood and affect.

## 2017-05-25 NOTE — Patient Instructions (Signed)
I have ordered an xray to see your stomach and first part of small intestine better.   I will talk with Vascular to see about the best test to evaluate the vessels in your abdomen.  Please increase Protonix back to twice a day, 30 minutes before breakfast and dinner.   Keep up the good work with the healthy diet, and try to stick with softer foods.   It was a pleasure to see you today. I strive to create trusting relationships with patients to provide genuine, compassionate, and quality care. I value your feedback. If you receive a survey regarding your visit,  I greatly appreciate you the taking time to fill this out.   Annitta Needs, PhD, ANP-BC [facility]

## 2017-05-28 ENCOUNTER — Other Ambulatory Visit: Payer: Self-pay | Admitting: Cardiology

## 2017-05-28 NOTE — Telephone Encounter (Signed)
Aaron [name] - looks like you'll need to contact him

## 2017-05-28 NOTE — Telephone Encounter (Signed)
Called pt.  States he has been sick and in the hospital and has not had INR checked other that in the hospital.  Pt states he is taking 2.5mg  daily (not the last dose I had him on).  INR appt made for 06/02/17.  States he has enough coumadin to last till then.  Will adjust dose if needed and send in refill at that time. Pt appreciated call and states he would be here for appt.

## 2017-05-29 ENCOUNTER — Ambulatory Visit (HOSPITAL_COMMUNITY)
Admission: RE | Admit: 2017-05-29 | Discharge: 2017-05-29 | Disposition: A | Discharge status: Home / Self Care | Payer: Medicare HMO | Source: Ambulatory Visit | Attending: Gastroenterology | Admitting: Gastroenterology

## 2017-05-29 DIAGNOSIS — K2980 Duodenitis without bleeding: Secondary | ICD-10-CM | POA: Insufficient documentation

## 2017-05-29 DIAGNOSIS — N200 Calculus of kidney: Secondary | ICD-10-CM | POA: Insufficient documentation

## 2017-05-29 DIAGNOSIS — R1010 Upper abdominal pain, unspecified: Secondary | ICD-10-CM | POA: Insufficient documentation

## 2017-05-29 NOTE — Assessment & Plan Note (Addendum)
Recently inpatient Jan 2019 with extensive inflammatory changes of duodenum, ulceration. Path with peptic duodenitis with ulceration. Completely avoiding NSAIDs. Has not been taking PPI BID.  Previous colonoscopy with right sided colon ulcers and query transient ischemia, possible occult NSAID use. Presenting now with nausea, pain after eating, intermittent N/V. Proceeding with UGI with SBFT. Will discuss with Vascular the best way to evaluate vasculature, as he has chronic kidney disease and CTA or MRA not possible. May be best served with appointment with Vascular due to concern for vasculature disease. Increase PPI to BID.

## 2017-06-01 NOTE — Progress Notes (Signed)
cc'd to pcp 

## 2017-06-02 ENCOUNTER — Ambulatory Visit (INDEPENDENT_AMBULATORY_CARE_PROVIDER_SITE_OTHER): Payer: Medicare HMO | Admitting: *Deleted

## 2017-06-02 DIAGNOSIS — I480 Paroxysmal atrial fibrillation: Secondary | ICD-10-CM

## 2017-06-02 DIAGNOSIS — I255 Ischemic cardiomyopathy: Secondary | ICD-10-CM | POA: Diagnosis not present

## 2017-06-02 DIAGNOSIS — Z5181 Encounter for therapeutic drug level monitoring: Secondary | ICD-10-CM | POA: Diagnosis not present

## 2017-06-02 LAB — POCT INR: INR: 1.3

## 2017-06-02 NOTE — Patient Instructions (Signed)
Has been taking coumadin 1/2 tablet daily Start coumadin 1 tablet daily except 1/2 tablet on Mondays and Thursdays Recheck in 1 week

## 2017-06-08 ENCOUNTER — Other Ambulatory Visit: Payer: Self-pay | Admitting: Gastroenterology

## 2017-06-08 ENCOUNTER — Other Ambulatory Visit: Payer: Self-pay | Admitting: Interventional Radiology

## 2017-06-08 ENCOUNTER — Telehealth: Payer: Self-pay | Admitting: Gastroenterology

## 2017-06-08 DIAGNOSIS — R1010 Upper abdominal pain, unspecified: Secondary | ICD-10-CM

## 2017-06-08 DIAGNOSIS — K559 Vascular disorder of intestine, unspecified: Secondary | ICD-10-CM

## 2017-06-08 NOTE — Telephone Encounter (Signed)
Called spoke with Jocelyn Lamer. OV w/ mesenteric duplex scheduled with Dr. Jacqualyn Posey 06/18/17 at 8:45am/ NPO after midnight.  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 He reports he will come by tomorrow to pick this up. I have done so. Per Jocelyn Lamer, she can pull the records from [facility].

## 2017-06-08 NOTE — Telephone Encounter (Signed)
Discussed with Dr. Earleen Newport with IR. Not a lot of calcified disease of mesenteric arteries but has disease in other arteries. Recommending an office visit for possible chronic mesenteric ischemia with same day ultrasound mesenteric duplex. Please send referral. If any help needed, ask for Henri or Vickie. Thanks! Will need to be NPO that day.

## 2017-06-09 NOTE — Progress Notes (Signed)
PT is aware and Ok to schedule his appt in 6 weeks.

## 2017-06-09 NOTE — Progress Notes (Signed)
LMOM to call.

## 2017-06-09 NOTE — Progress Notes (Signed)
Duodenitis improving. No evidence of GOO. He will be seeing IR soon to evaluate for chronic mesenteric ischemia. Please make sure he has an appt here in 6 weeks.

## 2017-06-18 ENCOUNTER — Telehealth: Payer: Self-pay | Admitting: Radiology

## 2017-06-18 ENCOUNTER — Inpatient Hospital Stay: Admission: RE | Admit: 2017-06-18 | Payer: Medicare HMO | Source: Ambulatory Visit

## 2017-06-18 ENCOUNTER — Telehealth: Payer: Self-pay | Admitting: Gastroenterology

## 2017-06-18 ENCOUNTER — Other Ambulatory Visit: Payer: Medicare HMO

## 2017-06-18 NOTE — Telephone Encounter (Signed)
[facility] called to let us know that patient was scheduled for his U/S this morning and was suppose to follow up with Dr Jacqualyn Posey afterwards and was a no show.

## 2017-06-18 NOTE — Telephone Encounter (Signed)
REVIEWED-NO ADDITIONAL RECOMMENDATIONS. 

## 2017-06-18 NOTE — Telephone Encounter (Signed)
Called pt. He forgot about the appts. [facility] has spoke with him this morning.

## 2017-06-18 NOTE — Telephone Encounter (Signed)
No Show for [place] and consult with Dr Corrie Mckusick.  Phoned patient to reschedule appointments.  Patient states that he has changed his diet and is feeling better.  He does not want to reschedule these appointments.  Chrisha Vogel Riki Rusk, [place] 06/18/2017 10:27 AM

## 2017-06-18 NOTE — Telephone Encounter (Signed)
[facility] clinical pool: can we verify with patient that he is declining further evaluation with IR? There are 2 phone notes in chart, but it's unclear what patient has decided. Please make sure he has an appt to return in 3 months here regardless.

## 2017-06-19 ENCOUNTER — Encounter: Payer: Self-pay | Admitting: Gastroenterology

## 2017-06-19 NOTE — Telephone Encounter (Signed)
Patient called back and he reports he is declining to see IR. Reports he is feeling better. Patient aware we will need to see him in 3 months. Manuela Schwartz has scheduled appt and will send letter with appt info to patient in the mail.

## 2017-06-19 NOTE — Telephone Encounter (Signed)
LMOVM

## 2017-06-19 NOTE — Telephone Encounter (Signed)
Fowarding to stacey to schedule 3 month f/u appt

## 2017-06-30 ENCOUNTER — Other Ambulatory Visit: Payer: Self-pay | Admitting: *Deleted

## 2017-06-30 MED ORDER — WARFARIN SODIUM 5 MG PO TABS: ORAL_TABLET | ORAL | 1 refills | Status: DC

## 2017-07-02 ENCOUNTER — Ambulatory Visit: Payer: Medicaid Other | Admitting: Cardiology

## 2017-07-02 NOTE — Progress Notes (Deleted)
Clinical Summary Mr. Hoefle is a 62 y.o.male  1. Afib - ongoing issues with high heart rates, at 02/25/17 appt with PA Lenze coreg increased to 25mg  bid  2. CAD - mild CAD by cath in 2002. 80% small diag disease - 01/2017 nuclear stress inferior infarct without ischemia. LVEF 36%, intermediate risk based on LVEF  3. PAD - history of occldued left SFA s/p stent 09/2014 - plans for angio by vascular  4. Chronic systolic HF  - 78/2956 echo LVEF 40-45% - no ACE/ARB/aldactone due to poor renal function. Could consider hydral/nitrates  5. Hyperlipidemia  6. 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   7. Wheezing ?COPD   Past Medical History:  Diagnosis Date  . Anxiety   . Arthritis    knees , back , shoulders  . CAD (coronary artery disease)    Mild nonobstructive disease at cardiac catheterization 2002  . Chronic back pain   . CKD (chronic kidney disease), stage II   . COPD (chronic obstructive pulmonary disease) ([place])   . Diverticulitis   . Esophageal reflux   . Essential hypertension   . Hepatitis C   . History of syncope   . Hyperlipidemia   . Migraines   . Nephrolithiasis   . PAT (paroxysmal atrial tachycardia) ([place])   . Peripheral arterial disease (HCC)    Occluded left superficial femoral artery status post stent June 2016 - Dr. Trula Slade  . Pneumonia    as child   68 years old  . Polycystic kidney, unspecified type   . Syncope 09/2014     No Known Allergies   Current Outpatient Medications  Medication Sig Dispense Refill  . carvedilol (COREG) 25 MG tablet Take 1 tablet (25 mg total) 2 (two) times daily by mouth. 180 tablet 3  . doxazosin (CARDURA) 8 MG tablet Take 8 mg by mouth at bedtime.     . hydrOXYzine (ATARAX/VISTARIL) 25 MG tablet Take 25 mgs by mouth once daily    . ondansetron (ZOFRAN) 4 MG tablet Take 1 tablet (4 mg total) by mouth every 6 (six) hours as needed for nausea. 20 tablet 0  . oxyCODONE-acetaminophen (PERCOCET) 10-325 MG per  tablet Take 1 tablet by mouth every 4 (four) hours as needed for pain.     . pantoprazole (PROTONIX) 40 MG tablet Take 1 tablet (40 mg total) by mouth 2 (two) times daily. 60 tablet 1  . torsemide (DEMADEX) 20 MG tablet Take 1 tablet (20 mg total) by mouth daily. 30 tablet 0  . warfarin (COUMADIN) 5 MG tablet Take 1 tablet daily except 1/2 tablet on Sundays and Thursdays 30 tablet 1   No current facility-administered medications for this visit.      Past Surgical History:  Procedure Laterality Date  . BACK SURGERY    . BIOPSY  12/17/2016   Procedure: BIOPSY;  Surgeon: Daneil Dolin, MD;  [facility];  Service: Gastroenterology;;  gastric colon  . BIOPSY  04/29/2017   Procedure: BIOPSY;  Surgeon: Daneil Dolin, MD;  [facility];  Service: Endoscopy;;  duodenal biopsies  . BUNIONECTOMY    . CERVICAL SPINE SURGERY    . COLONOSCOPY  2008   Dr. Oneida Alar: rare sigmoid colon diverticulosis, internal hemorrhoids.   . COLONOSCOPY WITH PROPOFOL N/A 12/17/2016   dense left-sided diverticulosis, right colon ulcers s/p biopsy query occult NSAID use vs transient ischemia, not consistent with IBD. CMV stains negative.   . ESOPHAGEAL DILATION  12/17/2016   EGD with mild Schatzki's ring s/p dilatation, small hiatal hernia, erosive gastropathy (negative H.pylori gastritis)  . ESOPHAGOGASTRODUODENOSCOPY  2008   Dr. Oneida Alar: normal esophagus without Barrett's, antritis and duodenitis, path with H.pylori gastritis  . ESOPHAGOGASTRODUODENOSCOPY (EGD) WITH PROPOFOL N/A 12/17/2016   Procedure: ESOPHAGOGASTRODUODENOSCOPY (EGD) WITH PROPOFOL;  Surgeon: Daneil Dolin, MD;  [facility];  Service: Gastroenterology;  Laterality: N/A;  . ESOPHAGOGASTRODUODENOSCOPY (EGD) WITH PROPOFOL N/A 04/29/2017   Patchy erythema of gastric mucosa diffusely, extensive inflammatory changes in duodenum, geographic ulceration and mucosal edema present, encroaching somewhat on the lumen yet still widely  patent, distal second portion of duodenum appeared abnormal, path with peptic duodenitis with ulceration  . HERNIA REPAIR     umbilical  . laparoscopic inguinal hernia right  02/2017   [facility]  . PERIPHERAL VASCULAR CATHETERIZATION N/A 09/20/2014   Procedure: Abdominal Aortogram;  Surgeon: Serafina Mitchell, MD;  Location: [facility];  Service: Cardiovascular;  Laterality: N/A;     No Known Allergies    Family History  Problem Relation Age of Onset  . Alcoholism Mother   . Heart disease Father        Massive heart attack  . Heart attack Father   . Atrial fibrillation Father   . Colon cancer Father   . Colon cancer Maternal Grandfather 21  . Alcoholism Maternal Grandfather   . Renal cancer Cousin   . Ovarian cancer Sister      Social History Mr. Kristiansen reports that he has quit smoking. His smoking use included cigarettes. He has a 23.50 pack-year smoking history. He has quit using smokeless tobacco. His smokeless tobacco use included snuff and chew. Mr. Vanderwerf reports that he does not drink alcohol.   Review of Systems CONSTITUTIONAL: No weight loss, fever, chills, weakness or fatigue.  HEENT: Eyes: No visual loss, blurred vision, double vision or yellow sclerae.No hearing loss, sneezing, congestion, runny nose or sore throat.  SKIN: No rash or itching.  CARDIOVASCULAR:  RESPIRATORY: No shortness of breath, cough or sputum.  GASTROINTESTINAL: No anorexia, nausea, vomiting or diarrhea. No abdominal pain or blood.  GENITOURINARY: No burning on urination, no polyuria NEUROLOGICAL: No headache, dizziness, syncope, paralysis, ataxia, numbness or tingling in the extremities. No change in bowel or bladder control.  MUSCULOSKELETAL: No muscle, back pain, joint pain or stiffness.  LYMPHATICS: No enlarged nodes. No history of splenectomy.  PSYCHIATRIC: No history of depression or anxiety.  ENDOCRINOLOGIC: No reports of sweating, cold or heat intolerance. No polyuria or polydipsia.   Marland Kitchen   Physical Examination There were no vitals filed for this visit. There were no vitals filed for this visit.  Gen: resting comfortably, no acute distress HEENT: no scleral icterus, pupils equal round and reactive, no palptable cervical adenopathy,  CV Resp: Clear to auscultation bilaterally GI: abdomen is soft, non-tender, non-distended, normal bowel sounds, no hepatosplenomegaly MSK: extremities are warm, no edema.  Skin: warm, no rash Neuro:  no focal deficits Psych: appropriate affect   Diagnostic Studies  01/2017 echo [facility] - LVEF 06-26%, grade I diastolic dysfunction     Assessment and Plan        Arnoldo Lenis, M.D., F.A.C.C.

## 2017-08-05 ENCOUNTER — Other Ambulatory Visit: Payer: Self-pay

## 2017-08-05 ENCOUNTER — Emergency Department (HOSPITAL_COMMUNITY)
Admission: EM | Admit: 2017-08-05 | Discharge: 2017-08-06 | Disposition: A | Discharge status: Home / Self Care | Payer: Medicare HMO | Attending: Emergency Medicine | Admitting: Emergency Medicine

## 2017-08-05 ENCOUNTER — Telehealth: Payer: Self-pay | Admitting: *Deleted

## 2017-08-05 ENCOUNTER — Encounter (HOSPITAL_COMMUNITY): Payer: Self-pay

## 2017-08-05 DIAGNOSIS — I70213 Atherosclerosis of native arteries of extremities with intermittent claudication, bilateral legs: Secondary | ICD-10-CM | POA: Diagnosis not present

## 2017-08-05 DIAGNOSIS — I2510 Atherosclerotic heart disease of native coronary artery without angina pectoris: Secondary | ICD-10-CM | POA: Diagnosis not present

## 2017-08-05 DIAGNOSIS — I10 Essential (primary) hypertension: Secondary | ICD-10-CM

## 2017-08-05 DIAGNOSIS — I739 Peripheral vascular disease, unspecified: Secondary | ICD-10-CM

## 2017-08-05 DIAGNOSIS — Z79899 Other long term (current) drug therapy: Secondary | ICD-10-CM | POA: Insufficient documentation

## 2017-08-05 DIAGNOSIS — N182 Chronic kidney disease, stage 2 (mild): Secondary | ICD-10-CM | POA: Diagnosis not present

## 2017-08-05 DIAGNOSIS — I129 Hypertensive chronic kidney disease with stage 1 through stage 4 chronic kidney disease, or unspecified chronic kidney disease: Secondary | ICD-10-CM | POA: Diagnosis not present

## 2017-08-05 DIAGNOSIS — Z7901 Long term (current) use of anticoagulants: Secondary | ICD-10-CM | POA: Diagnosis not present

## 2017-08-05 DIAGNOSIS — F17210 Nicotine dependence, cigarettes, uncomplicated: Secondary | ICD-10-CM | POA: Insufficient documentation

## 2017-08-05 DIAGNOSIS — J449 Chronic obstructive pulmonary disease, unspecified: Secondary | ICD-10-CM | POA: Insufficient documentation

## 2017-08-05 DIAGNOSIS — M79604 Pain in right leg: Secondary | ICD-10-CM | POA: Diagnosis present

## 2017-08-05 LAB — BASIC METABOLIC PANEL
Anion gap: 14 (ref 5–15)
BUN: 52 mg/dL — ABNORMAL HIGH (ref 6–20)
CO2: 21 mmol/L — ABNORMAL LOW (ref 22–32)
Calcium: 7.2 mg/dL — ABNORMAL LOW (ref 8.9–10.3)
Chloride: 97 mmol/L — ABNORMAL LOW (ref 101–111)
Creatinine, Ser: 5.8 mg/dL — ABNORMAL HIGH (ref 0.61–1.24)
GFR calc Af Amer: 11 mL/min — ABNORMAL LOW (ref >60)
GFR calc non Af Amer: 9 mL/min — ABNORMAL LOW (ref >60)
Glucose, Bld: 97 mg/dL (ref 65–99)
Potassium: 3.8 mmol/L (ref 3.5–5.1)
Sodium: 132 mmol/L — ABNORMAL LOW (ref 135–145)

## 2017-08-05 LAB — CBC WITH DIFFERENTIAL/PLATELET
Basophils Absolute: 0 10*3/uL (ref 0.0–0.1)
Basophils Relative: 0 %
Eosinophils Absolute: 0.1 10*3/uL (ref 0.0–0.7)
Eosinophils Relative: 2 %
HCT: 34.8 % — ABNORMAL LOW (ref 39.0–52.0)
Hemoglobin: 11.5 g/dL — ABNORMAL LOW (ref 13.0–17.0)
Lymphocytes Relative: 16 %
Lymphs Abs: 1.2 10*3/uL (ref 0.7–4.0)
MCH: 31.5 pg (ref 26.0–34.0)
MCHC: 33 g/dL (ref 30.0–36.0)
MCV: 95.3 fL (ref 78.0–100.0)
Monocytes Absolute: 0.6 10*3/uL (ref 0.1–1.0)
Monocytes Relative: 9 %
Neutro Abs: 5.2 10*3/uL (ref 1.7–7.7)
Neutrophils Relative %: 73 %
Platelets: 194 10*3/uL (ref 150–400)
RBC: 3.65 MIL/uL — ABNORMAL LOW (ref 4.22–5.81)
RDW: 14.1 % (ref 11.5–15.5)
WBC: 7.1 10*3/uL (ref 4.0–10.5)

## 2017-08-05 LAB — APTT: aPTT: 43 seconds — ABNORMAL HIGH (ref 24–36)

## 2017-08-05 LAB — PROTIME-INR
INR: 2.56
Prothrombin Time: 27.3 seconds — ABNORMAL HIGH (ref 11.4–15.2)

## 2017-08-05 NOTE — ED Notes (Signed)
Weak pulse present with [place].  Marked with marker.

## 2017-08-05 NOTE — ED Provider Notes (Signed)
Patient placed in Quick Look pathway, seen and evaluated   Chief Complaint: Right leg pain  HPI:   Patient is a 62 year old male with history of atherosclerosis in bilateral lower extremities with history of stents in his left leg who presents with right leg pain that has been progressively worsening.  He has had associated numbness, tingling, burning in his right foot, as well as cramping.  He reports after walking for a short period of time his legs start hurting or gives out on him.  He reports his symptoms are very similar to prior to having stents placed in his left leg.  Was sent here after he called his vascular doctor.  He denies any chest pain, shortness of breath, recent injury.  ROS: Positive for leg pain, numbness tingling.  Negative for chest pain, shortness of breath  Physical Exam:   Gen: No distress  Neuro: Awake and Alert  Skin: Warm    Focused Exam: Tenderness to the right foot and calf, heart normal rate and rhythm, lungs clear to auscultation, DP pulse very subtle in the right foot, however Doppler confirmed presence, right foot is warm, cap refill less than 2 seconds, normal color  Labs initiated in triage. Will defer to next provider for imaging vs vascular consult  Initiation of care has begun. The patient has been counseled on the process, plan, and necessity for staying for the completion/evaluation, and the remainder of the medical screening examination    Caryl Ada 08/05/17 1704    Duffy Bruce, MD 08/06/17 [phone]

## 2017-08-05 NOTE — ED Notes (Signed)
Results reviewed.  No changes in acuity at this time 

## 2017-08-05 NOTE — Telephone Encounter (Signed)
Patient called wanting appointment "to get my leg fixed" Has a history of no show for scheduled procedures. C/O severe leg pain (right) from hip to foot x a "several" weeks that is worsening.right foot is numb, cooler than left and pale in color. Patient instructed to report to [name] for evaluation tell staff there to call for vascular doctor consult. Patient verbalized understanding.  Patient stated he will need to arrange transportation.

## 2017-08-05 NOTE — ED Triage Notes (Signed)
Pt states he is experiencing cramping in right leg; leg gives out on him; pt c/o numbness, tingling, burning in right foot; hx of same in left foot; states when he walks for an short period of time, his lower leg starts hurting or "gives out"; hx of stent placement in upper left leg

## 2017-08-06 ENCOUNTER — Telehealth: Payer: Self-pay | Admitting: Vascular Surgery

## 2017-08-06 DIAGNOSIS — I70213 Atherosclerosis of native arteries of extremities with intermittent claudication, bilateral legs: Secondary | ICD-10-CM | POA: Diagnosis not present

## 2017-08-06 MED ORDER — CARVEDILOL 12.5 MG PO TABS
25.0000 mg | ORAL_TABLET | ORAL | Status: AC
  Administered 2017-08-06: 25 mg via ORAL
  Filled 2017-08-06: qty 2

## 2017-08-06 MED ORDER — CARVEDILOL 12.5 MG PO TABS: 25.0000 mg | ORAL_TABLET | Freq: Two times a day (BID) | ORAL | Status: DC

## 2017-08-06 MED ORDER — DOXAZOSIN MESYLATE 8 MG PO TABS
8.0000 mg | ORAL_TABLET | Freq: Every day | ORAL | Status: DC
  Administered 2017-08-06: 8 mg via ORAL
  Filled 2017-08-06: qty 1

## 2017-08-06 MED ORDER — OXYCODONE-ACETAMINOPHEN 5-325 MG PO TABS
2.0000 | ORAL_TABLET | Freq: Once | ORAL | Status: AC
  Administered 2017-08-06: 2 via ORAL
  Filled 2017-08-06: qty 2

## 2017-08-06 NOTE — ED Notes (Signed)
Pt discharged from ED; instructions provided; Pt encouraged to return to ED if symptoms worsen and to f/u with PCP; Pt verbalized understanding of all instructions 

## 2017-08-06 NOTE — ED Notes (Signed)
Pt reports having blockages in his legs that he was supposed to have surgery on in January. Pt reports he has issues with his transportation and didn't go for surgery. Pt states now for the past 2 weeks his pain has been getting increasingly worse. This is the reason he came in for evaluation.

## 2017-08-06 NOTE — ED Provider Notes (Signed)
[facility] EMERGENCY DEPARTMENT Provider Note   CSN: [id number] Arrival date & time: 08/05/17  1459     History   Chief Complaint Chief Complaint  Patient presents with  . Leg Pain    HPI Aaron [name] is a 62 y.o. male.  Patient presents to the emergency department for evaluation of right leg pain.  Patient reports that he has a history of blockages in his legs.  He has had previous stenting of his left leg.  Over the last several months he has had progressive worsening of pain in his right leg.  Patient reports that minimal walking now because of severe pain.  After he walks a very short distance, such as around his house, he has to sit down and rest to stop the pain.     Past Medical History:  Diagnosis Date  . Anxiety   . Arthritis    knees , back , shoulders  . CAD (coronary artery disease)    Mild nonobstructive disease at cardiac catheterization 2002  . Chronic back pain   . CKD (chronic kidney disease), stage II   . COPD (chronic obstructive pulmonary disease) ([place])   . Diverticulitis   . Esophageal reflux   . Essential hypertension   . Hepatitis C   . History of syncope   . Hyperlipidemia   . Migraines   . Nephrolithiasis   . PAT (paroxysmal atrial tachycardia) ([name])   . Peripheral arterial disease (HCC)    Occluded left superficial femoral artery status post stent June 2016 - Dr. Trula Slade  . Pneumonia    as child   63 years old  . Polycystic kidney, unspecified type   . Syncope 09/2014    Patient Active Problem List   Diagnosis Date Noted  . Duodenitis 04/27/2017  . Schatzki's ring of distal esophagus 04/27/2017  . Encounter for therapeutic drug monitoring 03/03/2017  . Abdominal pain 02/20/2017  . History of hepatitis C 02/20/2017  . Ischemic cardiomyopathy 01/27/2017  . Dyslipidemia 01/27/2017  . Paroxysmal atrial fibrillation ([place]) 01/27/2017  . Preoperative clearance 01/21/2017  . CAD (coronary artery disease) 01/21/2017  .  Pain of upper abdomen   . Acute renal failure (ARF) ([place]) 12/16/2016  . Rectal bleeding   . AKI (acute kidney injury) ([place]) 12/15/2016  . CKD (chronic kidney disease), stage V ([place]) 12/15/2016  . Hepatitis C 12/15/2016  . Polycystic kidney disease 12/14/2016  . Jerking 09/23/2014  . Syncope 09/23/2014  . COPD (chronic obstructive pulmonary disease) ([place])   . COLD (chronic obstructive lung disease) ([place])   . Depression   . PAD (peripheral artery disease) ([place]) 09/19/2014  . Preop cardiovascular exam 10/28/2010  . Tobacco abuse 10/28/2010  . SYNCOPE 05/07/2010  . UNSPECIFIED IRON DEFICIENCY ANEMIA 10/04/2009  . 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  . Essential hypertension 10/04/2009  . Esophageal reflux 10/04/2009  . PRECORDIAL PAIN 10/04/2009    Past Surgical History:  Procedure Laterality Date  . BACK SURGERY    . BIOPSY  12/17/2016   Procedure: BIOPSY;  Surgeon: Daneil Dolin, MD;  [facility];  Service: Gastroenterology;;  gastric colon  . BIOPSY  04/29/2017   Procedure: BIOPSY;  Surgeon: Daneil Dolin, MD;  [facility];  Service: Endoscopy;;  duodenal biopsies  . BUNIONECTOMY    . CERVICAL SPINE SURGERY    . COLONOSCOPY  2008   Dr. Oneida Alar: rare sigmoid colon diverticulosis, internal hemorrhoids.   . COLONOSCOPY WITH PROPOFOL N/A 12/17/2016  dense left-sided diverticulosis, right colon ulcers s/p biopsy query occult NSAID use vs transient ischemia, not consistent with IBD. CMV stains negative.   . ESOPHAGEAL DILATION  12/17/2016   EGD with mild Schatzki's ring s/p dilatation, small hiatal hernia, erosive gastropathy (negative H.pylori gastritis)  . ESOPHAGOGASTRODUODENOSCOPY  2008   Dr. Oneida Alar: normal esophagus without Barrett's, antritis and duodenitis, path with H.pylori gastritis  . ESOPHAGOGASTRODUODENOSCOPY (EGD) WITH PROPOFOL N/A 12/17/2016   Procedure: ESOPHAGOGASTRODUODENOSCOPY (EGD) WITH PROPOFOL;  Surgeon: Daneil Dolin, MD;  [facility];  Service: Gastroenterology;  Laterality: N/A;  . ESOPHAGOGASTRODUODENOSCOPY (EGD) WITH PROPOFOL N/A 04/29/2017   Patchy erythema of gastric mucosa diffusely, extensive inflammatory changes in duodenum, geographic ulceration and mucosal edema present, encroaching somewhat on the lumen yet still widely patent, distal second portion of duodenum appeared abnormal, path with peptic duodenitis with ulceration  . HERNIA REPAIR     umbilical  . laparoscopic inguinal hernia right  02/2017   [facility]  . PERIPHERAL VASCULAR CATHETERIZATION N/A 09/20/2014   Procedure: Abdominal Aortogram;  Surgeon: Serafina Mitchell, MD;  Location: [facility];  Service: Cardiovascular;  Laterality: N/A;        Home Medications    Prior to Admission medications   Medication Sig Start Date End Date Taking? Authorizing Provider  carvedilol (COREG) 25 MG tablet Take 1 tablet (25 mg total) 2 (two) times daily by mouth. 02/25/17 05/26/17  Imogene Burn, PA-C  doxazosin (CARDURA) 8 MG tablet Take 8 mg by mouth at bedtime.     [provider]  hydrOXYzine (ATARAX/VISTARIL) 25 MG tablet Take 25 mgs by mouth once daily 11/28/16   [provider]  ondansetron (ZOFRAN) 4 MG tablet Take 1 tablet (4 mg total) by mouth every 6 (six) hours as needed for nausea. 04/30/17   Manuella Ghazi, Pratik D, DO  oxyCODONE-acetaminophen (PERCOCET) 10-325 MG per tablet Take 1 tablet by mouth every 4 (four) hours as needed for pain.     [provider]  pantoprazole (PROTONIX) 40 MG tablet Take 1 tablet (40 mg total) by mouth 2 (two) times daily. 04/30/17 05/30/17  Manuella Ghazi, Pratik D, DO  torsemide (DEMADEX) 20 MG tablet Take 1 tablet (20 mg total) by mouth daily. 12/26/16   Reyne Dumas, MD  warfarin (COUMADIN) 5 MG tablet Take 1 tablet daily except 1/2 tablet on Sundays and Thursdays 06/30/17   Minus Breeding, MD    Family History Family History  Problem Relation Age of Onset  . Alcoholism Mother   . Heart disease  Father        Massive heart attack  . Heart attack Father   . Atrial fibrillation Father   . Colon cancer Father   . Colon cancer Maternal Grandfather 73  . Alcoholism Maternal Grandfather   . Renal cancer Cousin   . Ovarian cancer Sister     Social History Social History   Tobacco Use  . Smoking status: Current Every Day Smoker    Packs/day: 1.00    Years: 47.00    Pack years: 47.00    Types: Cigarettes  . Smokeless tobacco: Former Systems developer    Types: Snuff, Chew  Substance Use Topics  . Alcohol use: No    Alcohol/week: 0.0 oz  . Drug use: No    Comment: states in remote past he used "whatever was around"     Allergies   Patient has no known allergies.   Review of Systems Review of Systems  Musculoskeletal:  Leg pain  All other systems reviewed and are negative.    Physical Exam Updated Vital Signs BP (!) 196/102   Pulse 66   Temp 98.2 F (36.8 C)   Resp 18   Ht 6\' 4"  (1.93 m)   Wt 83.5 kg (184 lb)   SpO2 100%   BMI 22.40 kg/m   Physical Exam  Constitutional: He is oriented to person, place, and time. He appears well-developed and well-nourished. No distress.  HENT:  Head: Normocephalic and atraumatic.  Right Ear: Hearing normal.  Left Ear: Hearing normal.  Nose: Nose normal.  Mouth/Throat: Oropharynx is clear and moist and mucous membranes are normal.  Eyes: Pupils are equal, round, and reactive to light. Conjunctivae and EOM are normal.  Neck: Normal range of motion. Neck supple.  Cardiovascular: Regular rhythm, S1 normal and S2 normal. Exam reveals no gallop and no friction rub.  No murmur heard. Pulmonary/Chest: Effort normal and breath sounds normal. No respiratory distress. He exhibits no tenderness.  Abdominal: Soft. Normal appearance and bowel sounds are normal. There is no hepatosplenomegaly. There is no tenderness. There is no rebound, no guarding, no tenderness at McBurney's point and negative Murphy's sign. No hernia.  Musculoskeletal:  Normal range of motion.  Neurological: He is alert and oriented to person, place, and time. He has normal strength. No cranial nerve deficit or sensory deficit. Coordination normal. GCS eye subscore is 4. GCS verbal subscore is 5. GCS motor subscore is 6.  Skin: Skin is warm, dry and intact. No rash noted. No cyanosis.  Psychiatric: He has a normal mood and affect. His speech is normal and behavior is normal. Thought content normal.  Nursing note and vitals reviewed.    ED Treatments / Results  Labs (all labs ordered are listed, but only abnormal results are displayed) Labs Reviewed  CBC WITH DIFFERENTIAL/PLATELET - Abnormal; Notable for the following components:      Result Value   RBC 3.65 (*)    Hemoglobin 11.5 (*)    HCT 34.8 (*)    All other components within normal limits  BASIC METABOLIC PANEL - Abnormal; Notable for the following components:   Sodium 132 (*)    Chloride 97 (*)    CO2 21 (*)    BUN 52 (*)    Creatinine, Ser 5.80 (*)    Calcium 7.2 (*)    GFR calc non Af Amer 9 (*)    GFR calc Af Amer 11 (*)    All other components within normal limits  PROTIME-INR - Abnormal; Notable for the following components:   Prothrombin Time 27.3 (*)    All other components within normal limits  APTT - Abnormal; Notable for the following components:   aPTT 43 (*)    All other components within normal limits    EKG None  Radiology No results found.  Procedures Procedures (including critical care time)  Medications Ordered in ED Medications  doxazosin (CARDURA) tablet 8 mg (has no administration in time range)  carvedilol (COREG) tablet 25 mg (has no administration in time range)  oxyCODONE-acetaminophen (PERCOCET/ROXICET) 5-325 MG per tablet 2 tablet (has no administration in time range)     Initial Impression / Assessment and Plan / ED Course  I have reviewed the triage vital signs and the nursing notes.  Pertinent labs & imaging results that were available during  my care of the patient were reviewed by me and considered in my medical decision making (see chart for details).  Patient presents to the emergency department for evaluation of right leg pain.  Patient has a known history of peripheral arterial disease.  Reviewing his records reveals that he was to have intervention performed in January of this year but it did not occur.  Patient reportedly had some transportation issues and did not present for the surgery.  His symptoms have now progressed.  He does not have palpable pulse in the right leg, but pulses are easily identified with bedside Doppler.  There is no discoloration or cyanosis, foot and toes are warm.  Discussed with Dr. early, on-call for vascular surgery.  He did indicate that there was no need for emergent or urgent intervention.  His office will call the patient in the morning and he will be scheduled for surgery.  Dr. early did not feel that the patient would require any further imaging or studies.  Patient was noted to be hypertensive.  He missed his nighttime dose of medications.  He was given his medications and a Percocet for pain, blood pressure has significantly improved.  Patient will be discharged, follow-up with vascular surgery.  Final Clinical Impressions(s) / ED Diagnoses   Final diagnoses:  Claudication of right lower extremity [facility])  Essential hypertension    ED Discharge Orders    None       Burch Marchuk, Gwenyth Allegra, MD 08/06/17 [phone]

## 2017-08-06 NOTE — Telephone Encounter (Signed)
Received a call from the emergency department at 2:30 AM.  The patient had presented there as instructed to rule out critical limb ischemia.  The patient had Doppler flow and was motor and sensory intact physical exam by the emergency room physician.  He had been scheduled for treatment of his severe limiting claudication in January but did not show for his appointments.  Will discuss with Dr. Trula Slade and re-enroll him for treatment.  Is on Coumadin and does have renal insufficiency which will complicate his treatment

## 2017-08-07 ENCOUNTER — Telehealth: Payer: Self-pay | Admitting: *Deleted

## 2017-08-07 NOTE — Telephone Encounter (Signed)
Spoke with patient's daughter and gave her appointment date and time.

## 2017-08-07 NOTE — Telephone Encounter (Signed)
Called and left message for patient, office appointment on 08/10/17 to be at office at 3:45 pm. Instructed patient to call this nurse back to confirm.

## 2017-08-10 ENCOUNTER — Encounter: Payer: Self-pay | Admitting: *Deleted

## 2017-08-10 ENCOUNTER — Encounter: Payer: Self-pay | Admitting: Surgery

## 2017-08-10 ENCOUNTER — Other Ambulatory Visit: Payer: Self-pay | Admitting: *Deleted

## 2017-08-10 ENCOUNTER — Ambulatory Visit (INDEPENDENT_AMBULATORY_CARE_PROVIDER_SITE_OTHER): Payer: Medicare HMO | Admitting: Surgery

## 2017-08-10 VITALS — BP 184/102 | HR 56 | Temp 98.1°F | Resp 18 | Ht 76.0 in | Wt 196.5 lb

## 2017-08-10 DIAGNOSIS — I739 Peripheral vascular disease, unspecified: Secondary | ICD-10-CM

## 2017-08-10 DIAGNOSIS — N185 Chronic kidney disease, stage 5: Secondary | ICD-10-CM | POA: Diagnosis not present

## 2017-08-10 DIAGNOSIS — Z720 Tobacco use: Secondary | ICD-10-CM

## 2017-08-10 NOTE — Progress Notes (Addendum)
Established Intermittent Claudication   History of Present Illness   Aaron [name] is a 62 y.o. (1955-05-31) male who presents with chief complaint of R calf claudication and occasional rest pain.  Surgical history significant for left SFA stenting by Dr. Trula Slade in June 2016.  He had been scheduled for right lower extremity arteriogram for January of this year however due to family issues and patient recovering from recent MI, he canceled this procedure.  He presented to the emergency department last week with worsening claudication and rest pain.  Patient states that the emergency department physician told patient to stop taking his Coumadin until he sees Dr. Trula Slade in office as he may need surgery.  Patient is taking Coumadin for atrial fibrillation.  He denies any active tissue ischemia.  He currently smokes 1 pack every 2 days.  He would like to proceed with attempted revascularization of right lower extremity as soon as possible.  Past medical history is also significant for chronic kidney disease with a creatinine of over 5.  He has frequent visits with his nephrologist Dr. Joelyn Oms about every month and he is aware that he may need hemodialysis in the future.  The patient's PMH, PSH, SH, and FamHx were reviewed today and are unchanged from prior visit.  Current Outpatient Medications  Medication Sig Dispense Refill  . doxazosin (CARDURA) 8 MG tablet Take 8 mg by mouth at bedtime.     . hydrOXYzine (ATARAX/VISTARIL) 25 MG tablet Take 25 mgs by mouth once daily    . ondansetron (ZOFRAN) 4 MG tablet Take 1 tablet (4 mg total) by mouth every 6 (six) hours as needed for nausea. 20 tablet 0  . oxyCODONE-acetaminophen (PERCOCET) 10-325 MG per tablet Take 1 tablet by mouth every 4 (four) hours as needed for pain.     Marland Kitchen torsemide (DEMADEX) 20 MG tablet Take 1 tablet (20 mg total) by mouth daily. 30 tablet 0  . carvedilol (COREG) 25 MG tablet Take 1 tablet (25 mg total) 2 (two) times daily by  mouth. 180 tablet 3  . pantoprazole (PROTONIX) 40 MG tablet Take 1 tablet (40 mg total) by mouth 2 (two) times daily. 60 tablet 1   No current facility-administered medications for this visit.     On ROS today: 10 system ROS is negative unless otherwise noted in HPI   Physical Examination   Vitals:   08/10/17 1550 08/10/17 1552  BP: (!) 172/84 (!) 184/102  Pulse: (!) 56   Resp: 18   Temp: 98.1 F (36.7 C)   TempSrc: Oral   SpO2: 97%   Weight: 196 lb 8 oz (89.1 kg)   Height: 6\' 4"  (1.93 m)    Body mass index is 23.92 kg/m.  General Alert, O x 3, WD,   Pulmonary Sym exp, good B air movt, expiratory wheeze in various lung fields  Cardiac RRR, Nl S1, S2, occasional ectopy  Vascular Vessel Right Left  Radial Palpable Palpable  Brachial Palpable Palpable  Aorta Not palpable N/A  Femoral No definitive right femoral pulse Palpable  Popliteal Not palpable Not palpable  PT Not palpable  not examined  DP Not palpable Palpable    Gastro- intestinal soft, non-distended, non-tender to palpation,   Musculo- skeletal  small area of discoloration right great toe tip, no interrupted skin; soft right AT by Doppler; right PTA by Doppler  Neurologic Motor and sensation intact right foot    Non-Invasive Vascular imaging   Right ABI 0.5 as of  03/2017  Medical Decision Making   Aaron [name] is a 62 y.o. male who presents with:  right leg intermittent claudication with occasional rest pain.   Based on symptomatology as well as physical exam plan will be for right lower extremity arteriogram with possible revascularization by Dr. Trula Slade tomorrow 08/11/2017  Due to patient's CKD he will use CO2 angiography; patient is aware of the risk for kidney injury, ESRD, and need for indefinite hemodialysis  Patient will continue to hold his Coumadin  Patient is understanding of the risks and is willing to proceed with arteriogram tomorrow; he is also aware that right lower extremity PAD may  not be amendable by endovascular treatment   [name], PA-C [facility] [place] Office: [phone]  Patient is having significant symptoms in his right leg.  He has been off his Coumadin for a week.  Therefore I will schedule him for angiography tomorrow via a left femoral approach.  This will need to be done with CO2 as the patient's creatinine is now greater than 5.  He understands that this may push him over the top and have him require dialysis.  He understands that and understands that I will use minimal dye.  [name]

## 2017-08-10 NOTE — Progress Notes (Signed)
Left a message on patient's phone and daughter's phone to be at [facility] admitting at 12 noon on 08/11/17 for procedure all other instructions as per letter.

## 2017-08-10 NOTE — H&P (View-Only) (Signed)
Established Intermittent Claudication   History of Present Illness   Aaron [name] is a 62 y.o. (1955/05/18) male who presents with chief complaint of R calf claudication and occasional rest pain.  Surgical history significant for left SFA stenting by Dr. Trula Slade in June 2016.  He had been scheduled for right lower extremity arteriogram for January of this year however due to family issues and patient recovering from recent MI, he canceled this procedure.  He presented to the emergency department last week with worsening claudication and rest pain.  Patient states that the emergency department physician told patient to stop taking his Coumadin until he sees Dr. Trula Slade in office as he may need surgery.  Patient is taking Coumadin for atrial fibrillation.  He denies any active tissue ischemia.  He currently smokes 1 pack every 2 days.  He would like to proceed with attempted revascularization of right lower extremity as soon as possible.  Past medical history is also significant for chronic kidney disease with a creatinine of over 5.  He has frequent visits with his nephrologist Dr. Joelyn Oms about every month and he is aware that he may need hemodialysis in the future.  The patient's PMH, PSH, SH, and FamHx were reviewed today and are unchanged from prior visit.  Current Outpatient Medications  Medication Sig Dispense Refill  . doxazosin (CARDURA) 8 MG tablet Take 8 mg by mouth at bedtime.     . hydrOXYzine (ATARAX/VISTARIL) 25 MG tablet Take 25 mgs by mouth once daily    . ondansetron (ZOFRAN) 4 MG tablet Take 1 tablet (4 mg total) by mouth every 6 (six) hours as needed for nausea. 20 tablet 0  . oxyCODONE-acetaminophen (PERCOCET) 10-325 MG per tablet Take 1 tablet by mouth every 4 (four) hours as needed for pain.     Marland Kitchen torsemide (DEMADEX) 20 MG tablet Take 1 tablet (20 mg total) by mouth daily. 30 tablet 0  . carvedilol (COREG) 25 MG tablet Take 1 tablet (25 mg total) 2 (two) times daily by  mouth. 180 tablet 3  . pantoprazole (PROTONIX) 40 MG tablet Take 1 tablet (40 mg total) by mouth 2 (two) times daily. 60 tablet 1   No current facility-administered medications for this visit.     On ROS today: 10 system ROS is negative unless otherwise noted in HPI   Physical Examination   Vitals:   08/10/17 1550 08/10/17 1552  BP: (!) 172/84 (!) 184/102  Pulse: (!) 56   Resp: 18   Temp: 98.1 F (36.7 C)   TempSrc: Oral   SpO2: 97%   Weight: 196 lb 8 oz (89.1 kg)   Height: 6\' 4"  (1.93 m)    Body mass index is 23.92 kg/m.  General Alert, O x 3, WD,   Pulmonary Sym exp, good B air movt, expiratory wheeze in various lung fields  Cardiac RRR, Nl S1, S2, occasional ectopy  Vascular Vessel Right Left  Radial Palpable Palpable  Brachial Palpable Palpable  Aorta Not palpable N/A  Femoral No definitive right femoral pulse Palpable  Popliteal Not palpable Not palpable  PT Not palpable  not examined  DP Not palpable Palpable    Gastro- intestinal soft, non-distended, non-tender to palpation,   Musculo- skeletal  small area of discoloration right great toe tip, no interrupted skin; soft right AT by Doppler; right PTA by Doppler  Neurologic Motor and sensation intact right foot    Non-Invasive Vascular imaging   Right ABI 0.5 as of  03/2017  Medical Decision Making   Aaron [name] is a 62 y.o. male who presents with:  right leg intermittent claudication with occasional rest pain.   Based on symptomatology as well as physical exam plan will be for right lower extremity arteriogram with possible revascularization by Dr. Trula Slade tomorrow 08/11/2017  Due to patient's CKD he will use CO2 angiography; patient is aware of the risk for kidney injury, ESRD, and need for indefinite hemodialysis  Patient will continue to hold his Coumadin  Patient is understanding of the risks and is willing to proceed with arteriogram tomorrow; he is also aware that right lower extremity PAD may  not be amendable by endovascular treatment   [name], PA-C [facility] [place] Office: [phone]  Patient is having significant symptoms in his right leg.  He has been off his Coumadin for a week.  Therefore I will schedule him for angiography tomorrow via a left femoral approach.  This will need to be done with CO2 as the patient's creatinine is now greater than 5.  He understands that this may push him over the top and have him require dialysis.  He understands that and understands that I will use minimal dye.  [name]

## 2017-08-11 ENCOUNTER — Other Ambulatory Visit: Payer: Self-pay | Admitting: *Deleted

## 2017-08-11 ENCOUNTER — Ambulatory Visit (HOSPITAL_COMMUNITY)
Admission: RE | Admit: 2017-08-11 | Discharge: 2017-08-11 | Disposition: A | Discharge status: Home / Self Care | Payer: Medicare HMO | Source: Ambulatory Visit | Attending: Surgery | Admitting: Surgery

## 2017-08-11 ENCOUNTER — Ambulatory Visit (HOSPITAL_COMMUNITY)
Admission: RE | Disposition: A | Discharge status: Home / Self Care | Payer: Self-pay | Source: Ambulatory Visit | Attending: Surgery

## 2017-08-11 DIAGNOSIS — F17210 Nicotine dependence, cigarettes, uncomplicated: Secondary | ICD-10-CM | POA: Insufficient documentation

## 2017-08-11 DIAGNOSIS — I4891 Unspecified atrial fibrillation: Secondary | ICD-10-CM | POA: Insufficient documentation

## 2017-08-11 DIAGNOSIS — Z7901 Long term (current) use of anticoagulants: Secondary | ICD-10-CM | POA: Diagnosis not present

## 2017-08-11 DIAGNOSIS — I70221 Atherosclerosis of native arteries of extremities with rest pain, right leg: Secondary | ICD-10-CM | POA: Diagnosis not present

## 2017-08-11 DIAGNOSIS — N189 Chronic kidney disease, unspecified: Secondary | ICD-10-CM | POA: Insufficient documentation

## 2017-08-11 DIAGNOSIS — M79604 Pain in right leg: Secondary | ICD-10-CM | POA: Diagnosis present

## 2017-08-11 HISTORY — PX: ABDOMINAL AORTOGRAM: CATH118222

## 2017-08-11 HISTORY — PX: LOWER EXTREMITY ANGIOGRAPHY: CATH118251

## 2017-08-11 LAB — PROTIME-INR
INR: 1.07
PROTHROMBIN TIME: 13.8 s (ref 11.4–15.2)

## 2017-08-11 SURGERY — LOWER EXTREMITY ANGIOGRAPHY
Anesthesia: LOCAL | Laterality: Right

## 2017-08-11 MED ORDER — MIDAZOLAM HCL 2 MG/2ML IJ SOLN
INTRAMUSCULAR | Status: AC
  Filled 2017-08-11: qty 2

## 2017-08-11 MED ORDER — ASPIRIN EC 81 MG PO TBEC
81.0000 mg | DELAYED_RELEASE_TABLET | Freq: Every day | ORAL | Status: DC
  Filled 2017-08-11: qty 1

## 2017-08-11 MED ORDER — FENTANYL CITRATE (PF) 100 MCG/2ML IJ SOLN
INTRAMUSCULAR | Status: AC
  Filled 2017-08-11: qty 2

## 2017-08-11 MED ORDER — MIDAZOLAM HCL 2 MG/2ML IJ SOLN
INTRAMUSCULAR | Status: DC | PRN
  Administered 2017-08-11 (×2): 1 mg via INTRAVENOUS

## 2017-08-11 MED ORDER — SODIUM CHLORIDE 0.9% FLUSH: 3.0000 mL | INTRAVENOUS | Status: DC | PRN

## 2017-08-11 MED ORDER — LABETALOL HCL 5 MG/ML IV SOLN: 10.0000 mg | INTRAVENOUS | Status: DC | PRN

## 2017-08-11 MED ORDER — HEPARIN (PORCINE) IN NACL 1000-0.9 UT/500ML-% IV SOLN
INTRAVENOUS | Status: AC
  Filled 2017-08-11: qty 1000

## 2017-08-11 MED ORDER — FENTANYL CITRATE (PF) 100 MCG/2ML IJ SOLN
INTRAMUSCULAR | Status: DC | PRN
  Administered 2017-08-11: 25 ug via INTRAVENOUS
  Administered 2017-08-11: 50 ug via INTRAVENOUS

## 2017-08-11 MED ORDER — SODIUM CHLORIDE 0.9 % IV SOLN: 250.0000 mL | INTRAVENOUS | Status: DC | PRN

## 2017-08-11 MED ORDER — SODIUM CHLORIDE 0.9 % IV SOLN
INTRAVENOUS | Status: DC
  Administered 2017-08-11: 17:00:00 via INTRAVENOUS

## 2017-08-11 MED ORDER — LIDOCAINE HCL (PF) 1 % IJ SOLN
INTRAMUSCULAR | Status: DC | PRN
  Administered 2017-08-11: 15 mL

## 2017-08-11 MED ORDER — LIDOCAINE HCL (PF) 1 % IJ SOLN
INTRAMUSCULAR | Status: AC
  Filled 2017-08-11: qty 30

## 2017-08-11 MED ORDER — HEPARIN (PORCINE) IN NACL 2-0.9 UNITS/ML
INTRAMUSCULAR | Status: AC | PRN
  Administered 2017-08-11 (×2): 500 mL

## 2017-08-11 MED ORDER — HYDRALAZINE HCL 20 MG/ML IJ SOLN: 5.0000 mg | INTRAMUSCULAR | Status: DC | PRN

## 2017-08-11 MED ORDER — SODIUM CHLORIDE 0.9% FLUSH: 3.0000 mL | Freq: Two times a day (BID) | INTRAVENOUS | Status: DC

## 2017-08-11 MED ORDER — ACETAMINOPHEN 325 MG PO TABS: 650.0000 mg | ORAL_TABLET | ORAL | Status: DC | PRN

## 2017-08-11 MED ORDER — ONDANSETRON HCL 4 MG/2ML IJ SOLN
4.0000 mg | Freq: Four times a day (QID) | INTRAMUSCULAR | Status: DC | PRN
Start: 2017-08-11 — End: 2017-08-11

## 2017-08-11 SURGICAL SUPPLY — 14 items
CATH OMNI FLUSH 5F 65CM (CATHETERS) ×3 IMPLANT
COVER PRB 48X5XTLSCP FOLD TPE (BAG) IMPLANT
COVER PROBE 5X48 (BAG) ×3
DEVICE CLOSURE MYNXGRIP 5F (Vascular Products) ×1 IMPLANT
FILTER CO2 0.2 MICRON (VASCULAR PRODUCTS) ×1 IMPLANT
KIT MICROPUNCTURE NIT STIFF (SHEATH) ×1 IMPLANT
KIT PV (KITS) ×3 IMPLANT
RESERVOIR CO2 (VASCULAR PRODUCTS) ×1 IMPLANT
SET FLUSH CO2 (MISCELLANEOUS) ×1 IMPLANT
SHEATH AVANTI 11CM 5FR (SHEATH) ×1 IMPLANT
SYR MEDRAD MARK V 150ML (SYRINGE) ×3 IMPLANT
TRANSDUCER W/STOPCOCK (MISCELLANEOUS) ×3 IMPLANT
TRAY PV CATH (CUSTOM PROCEDURE TRAY) ×3 IMPLANT
WIRE BENTSON .035X145CM (WIRE) ×1 IMPLANT

## 2017-08-11 NOTE — Op Note (Signed)
    Patient name: Aaron [name] MRN: [id number] DOB: [birth year] Sex: male  08/11/2017 Pre-operative Diagnosis: Right leg pain Post-operative diagnosis:  Same Surgeon:  [name] Procedure Performed:  1.  Ultrasound-guided access, left femoral artery  2.  Abdominal aortogram with CO2  3.  Bilateral lower extremity runoff with CO2  4.  Conscious sedation (28 minutes)     Indications: The patient has previously undergone stenting of his left superficial femoral artery.  He now presents with borderline rest pain in his right leg with a scab over his great toe.  Procedure:  The patient was identified in the holding area and taken to room 8.  The patient was then placed supine on the table and prepped and draped in the usual sterile fashion.  A time out was called.  Conscious sedation was administered with the use of IV fentanyl and Versed under continuous physician and nurse monitoring.  Heart rate, blood pressure, and oxygen saturations were continuously monitored.  Ultrasound was used to evaluate the left common femoral artery.  It was patent .  A digital ultrasound image was acquired.  A micropuncture needle was used to access the left common femoral artery under ultrasound guidance.  An 018 wire was advanced without resistance and a micropuncture sheath was placed.  The 018 wire was removed and a benson wire was placed.  The micropuncture sheath was exchanged for a 5 french sheath.  An omniflush catheter was advanced over the wire to the level of L-1.  An abdominal angiogram was obtained with CO2.  Next the catheter was pulled out to the aortic bifurcation and pelvic angiography and bilateral runoff were obtained.  Findings:   Aortogram: No significant renal artery stenosis was identified.  The infrarenal abdominal aorta is widely patent.  The left common and external iliac arteries are patent throughout their course.  The right common iliac arter is patent throughout its course.  The right  external iliac artery is occluded with reconstitution of the mid common femoral artery.  Right Lower Extremity: Superficial femoral and popliteal artery are patent without significant stenosis.  The tibial vessels were not well visualized  Left Lower Extremity: The superficial femoral artery and its associated stent appear to be patent.  The tibial vessels are not well visualized  Intervention: Mynx device was used for closure  Impression:  #1  Occluded right external iliac artery.  The patient be scheduled for right femoral endarterectomy with retrograde stenting and possible left to right femoral-femoral bypass graft   V. [name], M.D. [facility] [place] Office: [phone] Pager:  [phone]

## 2017-08-11 NOTE — Interval H&P Note (Signed)
History and Physical Interval Note:  08/11/2017 3:02 PM  Aaron [name]  has presented today for surgery, with the diagnosis of pad  The various methods of treatment have been discussed with the patient and family. After consideration of risks, benefits and other options for treatment, the patient has consented to  Procedure(s): LOWER EXTREMITY ANGIOGRAPHY (N/A) as a surgical intervention .  The patient's history has been reviewed, patient examined, no change in status, stable for surgery.  I have reviewed the patient's chart and labs.  Questions were answered to the patient's satisfaction.     [name]

## 2017-08-11 NOTE — Discharge Instructions (Signed)

## 2017-08-12 ENCOUNTER — Encounter (HOSPITAL_COMMUNITY): Payer: Self-pay | Admitting: Surgery

## 2017-08-12 ENCOUNTER — Other Ambulatory Visit: Payer: Self-pay

## 2017-08-12 ENCOUNTER — Telehealth: Payer: Self-pay | Admitting: *Deleted

## 2017-08-12 MED FILL — Heparin Sod (Porcine)-NaCl IV Soln 1000 Unit/500ML-0.9%: INTRAVENOUS | Qty: 1000 | Status: AC

## 2017-08-12 NOTE — Telephone Encounter (Signed)
Call to patient instructed to be at [facility] admitting department at 10 am on 08/14/17 for surgery. No food or drink past MN night prior and to follow the detailed instructions received from the hospital pre-admission department about this surgery. Patient states he has been off Coumadin for "about 2 weeks" instructed to continue to hold Coumadin for surgery. Verbalized understanding.

## 2017-08-12 NOTE — Progress Notes (Signed)
Pt has hx of mild unobstructive CAD and PAT. Pt denies any recent chest pain or sob. Pt states he is not diabetic. Pt has Polycystic Kidney disease.

## 2017-08-13 ENCOUNTER — Telehealth: Payer: Self-pay | Admitting: *Deleted

## 2017-08-13 NOTE — Telephone Encounter (Signed)
Patient called and instructed to be at [facility] admitting at 8 am on 08/14/17 for surgery. Repeated and verbalized understanding.

## 2017-08-14 ENCOUNTER — Encounter (HOSPITAL_COMMUNITY): Payer: Self-pay | Admitting: *Deleted

## 2017-08-14 ENCOUNTER — Inpatient Hospital Stay (HOSPITAL_COMMUNITY): Payer: 59 | Admitting: Certified Registered Nurse Anesthetist

## 2017-08-14 ENCOUNTER — Inpatient Hospital Stay (HOSPITAL_COMMUNITY)
Admission: RE | Admit: 2017-08-14 | Discharge: 2017-08-16 | DRG: 271 | Disposition: A | Discharge status: Home / Self Care | Payer: 59 | Source: Ambulatory Visit | Attending: Surgery | Admitting: Surgery

## 2017-08-14 ENCOUNTER — Encounter (HOSPITAL_COMMUNITY)
Admission: RE | Disposition: A | Discharge status: Home / Self Care | Payer: Self-pay | Source: Ambulatory Visit | Attending: Surgery

## 2017-08-14 ENCOUNTER — Other Ambulatory Visit: Payer: Self-pay

## 2017-08-14 DIAGNOSIS — Z800 Family history of malignant neoplasm of digestive organs: Secondary | ICD-10-CM | POA: Diagnosis not present

## 2017-08-14 DIAGNOSIS — Z8041 Family history of malignant neoplasm of ovary: Secondary | ICD-10-CM | POA: Diagnosis not present

## 2017-08-14 DIAGNOSIS — K648 Other hemorrhoids: Secondary | ICD-10-CM | POA: Diagnosis present

## 2017-08-14 DIAGNOSIS — Z8249 Family history of ischemic heart disease and other diseases of the circulatory system: Secondary | ICD-10-CM | POA: Diagnosis not present

## 2017-08-14 DIAGNOSIS — I739 Peripheral vascular disease, unspecified: Secondary | ICD-10-CM | POA: Diagnosis present

## 2017-08-14 DIAGNOSIS — I70221 Atherosclerosis of native arteries of extremities with rest pain, right leg: Secondary | ICD-10-CM

## 2017-08-14 DIAGNOSIS — D631 Anemia in chronic kidney disease: Secondary | ICD-10-CM | POA: Diagnosis present

## 2017-08-14 DIAGNOSIS — R011 Cardiac murmur, unspecified: Secondary | ICD-10-CM | POA: Diagnosis present

## 2017-08-14 DIAGNOSIS — N2581 Secondary hyperparathyroidism of renal origin: Secondary | ICD-10-CM | POA: Diagnosis present

## 2017-08-14 DIAGNOSIS — E785 Hyperlipidemia, unspecified: Secondary | ICD-10-CM | POA: Diagnosis present

## 2017-08-14 DIAGNOSIS — I70211 Atherosclerosis of native arteries of extremities with intermittent claudication, right leg: Principal | ICD-10-CM | POA: Diagnosis present

## 2017-08-14 DIAGNOSIS — D509 Iron deficiency anemia, unspecified: Secondary | ICD-10-CM | POA: Diagnosis present

## 2017-08-14 DIAGNOSIS — Q613 Polycystic kidney, unspecified: Secondary | ICD-10-CM

## 2017-08-14 DIAGNOSIS — N185 Chronic kidney disease, stage 5: Secondary | ICD-10-CM | POA: Diagnosis present

## 2017-08-14 DIAGNOSIS — I2510 Atherosclerotic heart disease of native coronary artery without angina pectoris: Secondary | ICD-10-CM | POA: Diagnosis present

## 2017-08-14 DIAGNOSIS — Z87891 Personal history of nicotine dependence: Secondary | ICD-10-CM

## 2017-08-14 DIAGNOSIS — I120 Hypertensive chronic kidney disease with stage 5 chronic kidney disease or end stage renal disease: Secondary | ICD-10-CM | POA: Diagnosis present

## 2017-08-14 DIAGNOSIS — Z7951 Long term (current) use of inhaled steroids: Secondary | ICD-10-CM

## 2017-08-14 DIAGNOSIS — I4891 Unspecified atrial fibrillation: Secondary | ICD-10-CM | POA: Diagnosis present

## 2017-08-14 DIAGNOSIS — K219 Gastro-esophageal reflux disease without esophagitis: Secondary | ICD-10-CM | POA: Diagnosis present

## 2017-08-14 DIAGNOSIS — I771 Stricture of artery: Secondary | ICD-10-CM | POA: Diagnosis present

## 2017-08-14 DIAGNOSIS — J449 Chronic obstructive pulmonary disease, unspecified: Secondary | ICD-10-CM | POA: Diagnosis present

## 2017-08-14 HISTORY — PX: APPLICATION OF WOUND VAC: SHX5189

## 2017-08-14 HISTORY — DX: Personal history of urinary calculi: Z87.442

## 2017-08-14 HISTORY — PX: PATCH ANGIOPLASTY: SHX6230

## 2017-08-14 HISTORY — PX: INSERTION OF ILIAC STENT: SHX6256

## 2017-08-14 HISTORY — PX: ENDARTERECTOMY FEMORAL: SHX5804

## 2017-08-14 LAB — COMPREHENSIVE METABOLIC PANEL
ALK PHOS: 76 U/L (ref 38–126)
ALT: 7 U/L — AB (ref 17–63)
AST: 10 U/L — AB (ref 15–41)
Albumin: 3 g/dL — ABNORMAL LOW (ref 3.5–5.0)
Anion gap: 13 (ref 5–15)
BUN: 45 mg/dL — AB (ref 6–20)
CALCIUM: 8 mg/dL — AB (ref 8.9–10.3)
CHLORIDE: 107 mmol/L (ref 101–111)
CO2: 21 mmol/L — AB (ref 22–32)
CREATININE: 4.65 mg/dL — AB (ref 0.61–1.24)
GFR calc Af Amer: 14 mL/min — ABNORMAL LOW (ref >60)
GFR calc non Af Amer: 12 mL/min — ABNORMAL LOW (ref >60)
GLUCOSE: 94 mg/dL (ref 65–99)
Potassium: 3.7 mmol/L (ref 3.5–5.1)
SODIUM: 141 mmol/L (ref 135–145)
Total Bilirubin: 0.5 mg/dL (ref 0.3–1.2)
Total Protein: 6.6 g/dL (ref 6.5–8.1)

## 2017-08-14 LAB — TYPE AND SCREEN
ABO/RH(D): O POS
Antibody Screen: NEGATIVE

## 2017-08-14 LAB — URINALYSIS, ROUTINE W REFLEX MICROSCOPIC
Bilirubin Urine: NEGATIVE
GLUCOSE, UA: NEGATIVE mg/dL
Ketones, ur: NEGATIVE mg/dL
Leukocytes, UA: NEGATIVE
NITRITE: NEGATIVE
PROTEIN: NEGATIVE mg/dL
Specific Gravity, Urine: 1.006 (ref 1.005–1.030)
pH: 5 (ref 5.0–8.0)

## 2017-08-14 LAB — APTT: APTT: 32 s (ref 24–36)

## 2017-08-14 LAB — BLOOD GAS, ARTERIAL
Acid-base deficit: 2 mmol/L (ref 0.0–2.0)
Bicarbonate: 22.3 mmol/L (ref 20.0–28.0)
DRAWN BY: 421801
O2 SAT: 97.1 %
PATIENT TEMPERATURE: 98.6
PCO2 ART: 38.5 mmHg (ref 32.0–48.0)
PO2 ART: 93 mmHg (ref 83.0–108.0)
pH, Arterial: 7.381 (ref 7.350–7.450)

## 2017-08-14 LAB — CBC
HCT: 33 % — ABNORMAL LOW (ref 39.0–52.0)
HEMOGLOBIN: 10.7 g/dL — AB (ref 13.0–17.0)
MCH: 31.2 pg (ref 26.0–34.0)
MCHC: 32.4 g/dL (ref 30.0–36.0)
MCV: 96.2 fL (ref 78.0–100.0)
PLATELETS: 182 10*3/uL (ref 150–400)
RBC: 3.43 MIL/uL — AB (ref 4.22–5.81)
RDW: 14 % (ref 11.5–15.5)
WBC: 7.5 10*3/uL (ref 4.0–10.5)

## 2017-08-14 LAB — SURGICAL PCR SCREEN
MRSA, PCR: NEGATIVE
STAPHYLOCOCCUS AUREUS: NEGATIVE

## 2017-08-14 LAB — PROTIME-INR
INR: 1.05
Prothrombin Time: 13.6 seconds (ref 11.4–15.2)

## 2017-08-14 LAB — ABO/RH: ABO/RH(D): O POS

## 2017-08-14 SURGERY — ENDARTERECTOMY, FEMORAL
Anesthesia: General | Site: Groin | Laterality: Right

## 2017-08-14 MED ORDER — LIDOCAINE 2% (20 MG/ML) 5 ML SYRINGE
INTRAMUSCULAR | Status: AC
  Filled 2017-08-14: qty 15

## 2017-08-14 MED ORDER — CEFAZOLIN SODIUM-DEXTROSE 2-4 GM/100ML-% IV SOLN
2.0000 g | Freq: Two times a day (BID) | INTRAVENOUS | Status: AC
  Administered 2017-08-14 – 2017-08-15 (×2): 2 g via INTRAVENOUS
  Filled 2017-08-14 (×2): qty 100

## 2017-08-14 MED ORDER — HEPARIN SODIUM (PORCINE) 1000 UNIT/ML IJ SOLN
INTRAMUSCULAR | Status: AC
  Filled 2017-08-14: qty 2

## 2017-08-14 MED ORDER — ONDANSETRON HCL 4 MG/2ML IJ SOLN
INTRAMUSCULAR | Status: AC
  Filled 2017-08-14: qty 2

## 2017-08-14 MED ORDER — HYDROMORPHONE HCL 2 MG/ML IJ SOLN
0.3000 mg | INTRAMUSCULAR | Status: DC | PRN
  Administered 2017-08-14 (×3): 0.5 mg via INTRAVENOUS

## 2017-08-14 MED ORDER — CARVEDILOL 25 MG PO TABS
25.0000 mg | ORAL_TABLET | Freq: Two times a day (BID) | ORAL | Status: DC
  Administered 2017-08-14 – 2017-08-15 (×3): 25 mg via ORAL
  Filled 2017-08-14 (×4): qty 1

## 2017-08-14 MED ORDER — MIDAZOLAM HCL 2 MG/2ML IJ SOLN
INTRAMUSCULAR | Status: AC
  Filled 2017-08-14: qty 2

## 2017-08-14 MED ORDER — ONDANSETRON HCL 4 MG PO TABS
4.0000 mg | ORAL_TABLET | Freq: Four times a day (QID) | ORAL | Status: DC | PRN
Start: 2017-08-14 — End: 2017-08-16

## 2017-08-14 MED ORDER — ASPIRIN 81 MG PO CHEW
81.0000 mg | CHEWABLE_TABLET | Freq: Every day | ORAL | Status: DC
  Administered 2017-08-14 – 2017-08-15 (×2): 81 mg via ORAL
  Filled 2017-08-14 (×3): qty 1

## 2017-08-14 MED ORDER — HEMOSTATIC AGENTS (NO CHARGE) OPTIME
TOPICAL | Status: DC | PRN
  Administered 2017-08-14: 1 via TOPICAL

## 2017-08-14 MED ORDER — CHLORHEXIDINE GLUCONATE CLOTH 2 % EX PADS
6.0000 | MEDICATED_PAD | Freq: Once | CUTANEOUS | Status: DC
  Administered 2017-08-14: 6 via TOPICAL

## 2017-08-14 MED ORDER — TORSEMIDE 20 MG PO TABS
20.0000 mg | ORAL_TABLET | Freq: Every day | ORAL | Status: DC
  Administered 2017-08-14 – 2017-08-15 (×2): 20 mg via ORAL
  Filled 2017-08-14 (×3): qty 1

## 2017-08-14 MED ORDER — ROCURONIUM BROMIDE 50 MG/5ML IV SOLN
INTRAVENOUS | Status: AC
  Filled 2017-08-14: qty 2

## 2017-08-14 MED ORDER — PANTOPRAZOLE SODIUM 40 MG PO TBEC: 40.0000 mg | DELAYED_RELEASE_TABLET | Freq: Every day | ORAL | Status: DC

## 2017-08-14 MED ORDER — CHLORHEXIDINE GLUCONATE CLOTH 2 % EX PADS: 6.0000 | MEDICATED_PAD | Freq: Once | CUTANEOUS | Status: DC

## 2017-08-14 MED ORDER — HEPARIN SODIUM (PORCINE) 5000 UNIT/ML IJ SOLN
5000.0000 [IU] | Freq: Three times a day (TID) | INTRAMUSCULAR | Status: DC
  Administered 2017-08-15 – 2017-08-16 (×4): 5000 [IU] via SUBCUTANEOUS
  Filled 2017-08-14 (×4): qty 1

## 2017-08-14 MED ORDER — OXYCODONE HCL 5 MG PO TABS
5.0000 mg | ORAL_TABLET | ORAL | Status: DC | PRN
  Administered 2017-08-15 – 2017-08-16 (×4): 10 mg via ORAL
  Filled 2017-08-14 (×4): qty 2

## 2017-08-14 MED ORDER — CLOPIDOGREL BISULFATE 75 MG PO TABS
75.0000 mg | ORAL_TABLET | Freq: Every day | ORAL | Status: DC
  Administered 2017-08-14 – 2017-08-15 (×2): 75 mg via ORAL
  Filled 2017-08-14 (×3): qty 1

## 2017-08-14 MED ORDER — LABETALOL HCL 5 MG/ML IV SOLN: 10.0000 mg | INTRAVENOUS | Status: DC | PRN

## 2017-08-14 MED ORDER — ONDANSETRON HCL 4 MG/2ML IJ SOLN: 4.0000 mg | Freq: Four times a day (QID) | INTRAMUSCULAR | Status: DC | PRN

## 2017-08-14 MED ORDER — PHENYLEPHRINE 40 MCG/ML (10ML) SYRINGE FOR IV PUSH (FOR BLOOD PRESSURE SUPPORT)
PREFILLED_SYRINGE | INTRAVENOUS | Status: AC
  Filled 2017-08-14: qty 10

## 2017-08-14 MED ORDER — SODIUM CHLORIDE 0.9 % IJ SOLN: INTRAMUSCULAR | Status: DC | PRN

## 2017-08-14 MED ORDER — OXYCODONE HCL 5 MG/5ML PO SOLN: 5.0000 mg | Freq: Once | ORAL | Status: DC | PRN

## 2017-08-14 MED ORDER — PHENYLEPHRINE HCL 10 MG/ML IJ SOLN
INTRAMUSCULAR | Status: DC | PRN
  Administered 2017-08-14: 80 ug via INTRAVENOUS

## 2017-08-14 MED ORDER — ACETAMINOPHEN 325 MG RE SUPP: 325.0000 mg | RECTAL | Status: DC | PRN

## 2017-08-14 MED ORDER — FENTANYL CITRATE (PF) 250 MCG/5ML IJ SOLN
INTRAMUSCULAR | Status: AC
  Filled 2017-08-14: qty 5

## 2017-08-14 MED ORDER — WARFARIN - PHARMACIST DOSING INPATIENT
Freq: Every day | Status: DC
  Administered 2017-08-14: 18:00:00

## 2017-08-14 MED ORDER — ONDANSETRON HCL 4 MG/2ML IJ SOLN
INTRAMUSCULAR | Status: DC | PRN
  Administered 2017-08-14: 4 mg via INTRAVENOUS

## 2017-08-14 MED ORDER — SODIUM CHLORIDE 0.9 % IV SOLN
INTRAVENOUS | Status: AC
  Filled 2017-08-14: qty 1.2

## 2017-08-14 MED ORDER — MORPHINE SULFATE (PF) 2 MG/ML IV SOLN
2.0000 mg | INTRAVENOUS | Status: DC | PRN
  Administered 2017-08-14 (×2): 4 mg via INTRAVENOUS
  Administered 2017-08-15: 2 mg via INTRAVENOUS
  Administered 2017-08-15: 4 mg via INTRAVENOUS
  Administered 2017-08-15: 2 mg via INTRAVENOUS
  Filled 2017-08-14: qty 3
  Filled 2017-08-14 (×2): qty 1
  Filled 2017-08-14: qty 2
  Filled 2017-08-14: qty 1
  Filled 2017-08-14 (×2): qty 2

## 2017-08-14 MED ORDER — SODIUM CHLORIDE 0.9 % IJ SOLN
INTRAMUSCULAR | Status: AC
  Filled 2017-08-14: qty 20

## 2017-08-14 MED ORDER — SUGAMMADEX SODIUM 200 MG/2ML IV SOLN
INTRAVENOUS | Status: DC | PRN
Start: 2017-08-14 — End: 2017-08-14
  Administered 2017-08-14: 300 mg via INTRAVENOUS

## 2017-08-14 MED ORDER — LIDOCAINE HCL (CARDIAC) PF 100 MG/5ML IV SOSY
PREFILLED_SYRINGE | INTRAVENOUS | Status: DC | PRN
  Administered 2017-08-14: 60 mg via INTRAVENOUS

## 2017-08-14 MED ORDER — PHENOL 1.4 % MT LIQD: 1.0000 | OROMUCOSAL | Status: DC | PRN

## 2017-08-14 MED ORDER — PROTAMINE SULFATE 10 MG/ML IV SOLN
INTRAVENOUS | Status: DC | PRN
  Administered 2017-08-14: 20 mg via INTRAVENOUS
  Administered 2017-08-14: 10 mg via INTRAVENOUS
  Administered 2017-08-14: 20 mg via INTRAVENOUS

## 2017-08-14 MED ORDER — ACETAMINOPHEN 325 MG PO TABS: 325.0000 mg | ORAL_TABLET | ORAL | Status: DC | PRN

## 2017-08-14 MED ORDER — ROCURONIUM BROMIDE 100 MG/10ML IV SOLN
INTRAVENOUS | Status: DC | PRN
  Administered 2017-08-14 (×2): 10 mg via INTRAVENOUS
  Administered 2017-08-14: 50 mg via INTRAVENOUS
  Administered 2017-08-14: 20 mg via INTRAVENOUS

## 2017-08-14 MED ORDER — FLUTICASONE FUROATE-VILANTEROL 200-25 MCG/INH IN AEPB
1.0000 | INHALATION_SPRAY | Freq: Every day | RESPIRATORY_TRACT | Status: DC
  Filled 2017-08-14: qty 28

## 2017-08-14 MED ORDER — CEFAZOLIN SODIUM-DEXTROSE 2-4 GM/100ML-% IV SOLN
2.0000 g | Freq: Three times a day (TID) | INTRAVENOUS | Status: DC
  Filled 2017-08-14 (×2): qty 100

## 2017-08-14 MED ORDER — CALCITRIOL 0.5 MCG PO CAPS
0.5000 ug | ORAL_CAPSULE | Freq: Every day | ORAL | Status: DC
  Administered 2017-08-14 – 2017-08-15 (×2): 0.5 ug via ORAL
  Filled 2017-08-14: qty 1
  Filled 2017-08-14 (×2): qty 2
  Filled 2017-08-14: qty 1
  Filled 2017-08-14: qty 2
  Filled 2017-08-14: qty 1

## 2017-08-14 MED ORDER — PHENYLEPHRINE HCL 10 MG/ML IJ SOLN
INTRAVENOUS | Status: DC | PRN
  Administered 2017-08-14: 10 ug/min via INTRAVENOUS

## 2017-08-14 MED ORDER — MAGNESIUM SULFATE 2 GM/50ML IV SOLN: 2.0000 g | Freq: Every day | INTRAVENOUS | Status: DC | PRN

## 2017-08-14 MED ORDER — PROPOFOL 10 MG/ML IV BOLUS
INTRAVENOUS | Status: DC | PRN
  Administered 2017-08-14: 130 mg via INTRAVENOUS

## 2017-08-14 MED ORDER — MUPIROCIN 2 % EX OINT
1.0000 "application " | TOPICAL_OINTMENT | Freq: Once | CUTANEOUS | Status: AC
  Administered 2017-08-14: 1 via TOPICAL

## 2017-08-14 MED ORDER — HYDROMORPHONE HCL 2 MG/ML IJ SOLN
INTRAMUSCULAR | Status: AC
  Administered 2017-08-14: 0.5 mg via INTRAVENOUS
  Filled 2017-08-14: qty 1

## 2017-08-14 MED ORDER — KCL IN DEXTROSE-NACL 20-5-0.45 MEQ/L-%-% IV SOLN: INTRAVENOUS | Status: DC

## 2017-08-14 MED ORDER — CEFAZOLIN SODIUM-DEXTROSE 2-4 GM/100ML-% IV SOLN
2.0000 g | INTRAVENOUS | Status: AC
  Administered 2017-08-14: 2 g via INTRAVENOUS
  Filled 2017-08-14: qty 100

## 2017-08-14 MED ORDER — SODIUM CHLORIDE 0.9 % IV SOLN
INTRAVENOUS | Status: DC
  Administered 2017-08-14 (×3): via INTRAVENOUS

## 2017-08-14 MED ORDER — WARFARIN SODIUM 5 MG PO TABS
5.0000 mg | ORAL_TABLET | Freq: Once | ORAL | Status: AC
  Administered 2017-08-14: 5 mg via ORAL
  Filled 2017-08-14: qty 1

## 2017-08-14 MED ORDER — FLUTICASONE FUROATE-VILANTEROL 200-25 MCG/INH IN AEPB
1.0000 | INHALATION_SPRAY | Freq: Every day | RESPIRATORY_TRACT | Status: DC
  Administered 2017-08-15 – 2017-08-16 (×2): 1 via RESPIRATORY_TRACT
  Filled 2017-08-14: qty 28

## 2017-08-14 MED ORDER — HEPARIN SODIUM (PORCINE) 1000 UNIT/ML IJ SOLN
INTRAMUSCULAR | Status: DC | PRN
  Administered 2017-08-14: 2000 [IU] via INTRAVENOUS
  Administered 2017-08-14: 9000 [IU] via INTRAVENOUS
  Administered 2017-08-14: 2000 [IU] via INTRAVENOUS
  Administered 2017-08-14: 1000 [IU] via INTRAVENOUS
  Administered 2017-08-14: 2000 [IU] via INTRAVENOUS

## 2017-08-14 MED ORDER — HYDRALAZINE HCL 20 MG/ML IJ SOLN
5.0000 mg | INTRAMUSCULAR | Status: DC | PRN
  Administered 2017-08-15: 5 mg via INTRAVENOUS
  Filled 2017-08-14: qty 1

## 2017-08-14 MED ORDER — DEXAMETHASONE SODIUM PHOSPHATE 10 MG/ML IJ SOLN
INTRAMUSCULAR | Status: AC
  Filled 2017-08-14: qty 1

## 2017-08-14 MED ORDER — GUAIFENESIN-DM 100-10 MG/5ML PO SYRP: 15.0000 mL | ORAL_SOLUTION | ORAL | Status: DC | PRN

## 2017-08-14 MED ORDER — 0.9 % SODIUM CHLORIDE (POUR BTL) OPTIME
TOPICAL | Status: DC | PRN
  Administered 2017-08-14: 1000 mL
  Administered 2017-08-14: 2000 mL

## 2017-08-14 MED ORDER — POTASSIUM CHLORIDE CRYS ER 20 MEQ PO TBCR: 20.0000 meq | EXTENDED_RELEASE_TABLET | Freq: Every day | ORAL | Status: DC | PRN

## 2017-08-14 MED ORDER — DOCUSATE SODIUM 100 MG PO CAPS
100.0000 mg | ORAL_CAPSULE | Freq: Every day | ORAL | Status: DC
  Filled 2017-08-14 (×2): qty 1

## 2017-08-14 MED ORDER — PANTOPRAZOLE SODIUM 40 MG PO TBEC
40.0000 mg | DELAYED_RELEASE_TABLET | Freq: Two times a day (BID) | ORAL | Status: DC
  Administered 2017-08-14 – 2017-08-15 (×3): 40 mg via ORAL
  Filled 2017-08-14 (×4): qty 1

## 2017-08-14 MED ORDER — EPHEDRINE SULFATE 50 MG/ML IJ SOLN
INTRAMUSCULAR | Status: DC | PRN
  Administered 2017-08-14: 10 mg via INTRAVENOUS
  Administered 2017-08-14: 5 mg via INTRAVENOUS
  Administered 2017-08-14 (×2): 10 mg via INTRAVENOUS

## 2017-08-14 MED ORDER — HYDROXYZINE HCL 25 MG PO TABS: 25.0000 mg | ORAL_TABLET | ORAL | Status: DC | PRN

## 2017-08-14 MED ORDER — OXYCODONE HCL 5 MG PO TABS: 5.0000 mg | ORAL_TABLET | Freq: Once | ORAL | Status: DC | PRN

## 2017-08-14 MED ORDER — SODIUM CHLORIDE 0.9 % IV SOLN: 500.0000 mL | Freq: Once | INTRAVENOUS | Status: DC | PRN

## 2017-08-14 MED ORDER — FENTANYL CITRATE (PF) 100 MCG/2ML IJ SOLN
INTRAMUSCULAR | Status: DC | PRN
  Administered 2017-08-14 (×2): 100 ug via INTRAVENOUS
  Administered 2017-08-14 (×2): 50 ug via INTRAVENOUS
  Administered 2017-08-14: 100 ug via INTRAVENOUS
  Administered 2017-08-14: 50 ug via INTRAVENOUS

## 2017-08-14 MED ORDER — METOPROLOL TARTRATE 5 MG/5ML IV SOLN: 2.0000 mg | INTRAVENOUS | Status: DC | PRN

## 2017-08-14 MED ORDER — PROPOFOL 10 MG/ML IV BOLUS
INTRAVENOUS | Status: AC
  Filled 2017-08-14: qty 20

## 2017-08-14 MED ORDER — ALUM & MAG HYDROXIDE-SIMETH 200-200-20 MG/5ML PO SUSP: 15.0000 mL | ORAL | Status: DC | PRN

## 2017-08-14 MED ORDER — HEPARIN SODIUM (PORCINE) 5000 UNIT/ML IJ SOLN
INTRAMUSCULAR | Status: DC | PRN
  Administered 2017-08-14: 12:00:00

## 2017-08-14 MED ORDER — MUPIROCIN 2 % EX OINT
TOPICAL_OINTMENT | CUTANEOUS | Status: AC
Start: 2017-08-14 — End: 2017-08-14
  Administered 2017-08-14: 1 via TOPICAL
  Filled 2017-08-14: qty 22

## 2017-08-14 MED ORDER — VITAMIN D3 25 MCG (1000 UNIT) PO TABS: 2000.0000 [IU] | ORAL_TABLET | Freq: Every day | ORAL | Status: DC

## 2017-08-14 MED ORDER — DOXAZOSIN MESYLATE 8 MG PO TABS
8.0000 mg | ORAL_TABLET | Freq: Every day | ORAL | Status: DC
  Administered 2017-08-14 – 2017-08-15 (×2): 8 mg via ORAL
  Filled 2017-08-14: qty 1
  Filled 2017-08-14 (×2): qty 4
  Filled 2017-08-14: qty 1

## 2017-08-14 MED ORDER — MIDAZOLAM HCL 5 MG/5ML IJ SOLN
INTRAMUSCULAR | Status: DC | PRN
  Administered 2017-08-14: 2 mg via INTRAVENOUS

## 2017-08-14 MED ORDER — SUGAMMADEX SODIUM 500 MG/5ML IV SOLN
INTRAVENOUS | Status: AC
  Filled 2017-08-14: qty 10

## 2017-08-14 SURGICAL SUPPLY — 81 items
ADH SKN CLS APL DERMABOND .7 (GAUZE/BANDAGES/DRESSINGS) ×2
BAG SNAP BAND KOVER 36X36 (MISCELLANEOUS) ×3 IMPLANT
BALLN MUSTANG 6.0X40 75 (BALLOONS) ×3
BALLN MUSTANG 6.0X40 75CM (BALLOONS) ×1
BALLN MUSTANG 8X80X75 (BALLOONS) ×4
BALLOON MUSTANG 6.0X40 75 (BALLOONS) ×1 IMPLANT
BALLOON MUSTANG 8X80X75 (BALLOONS) ×1 IMPLANT
CANISTER SUCT 3000ML PPV (MISCELLANEOUS) ×4 IMPLANT
CATH EMB 4FR 40CM (CATHETERS) ×3 IMPLANT
CATH POLY DUAL LUMEN OCCL 5FR (CATHETERS) ×3 IMPLANT
CLIP VESOCCLUDE MED 24/CT (CLIP) ×4 IMPLANT
CLIP VESOCCLUDE MED 6/CT (CLIP) ×4 IMPLANT
CLIP VESOCCLUDE SM WIDE 24/CT (CLIP) ×4 IMPLANT
CLIP VESOCCLUDE SM WIDE 6/CT (CLIP) ×4 IMPLANT
COVER DOME SNAP 22 D (MISCELLANEOUS) ×3 IMPLANT
DERMABOND ADVANCED (GAUZE/BANDAGES/DRESSINGS) ×2
DERMABOND ADVANCED .7 DNX12 (GAUZE/BANDAGES/DRESSINGS) ×2 IMPLANT
DEVICE INFLATION ENCORE 26 (MISCELLANEOUS) ×3 IMPLANT
DEVICE TORQUE H2O (MISCELLANEOUS) ×3 IMPLANT
DRAIN CHANNEL 15F RND FF W/TCR (WOUND CARE) IMPLANT
DRAPE C-ARM 42X72 X-RAY (DRAPES) ×4 IMPLANT
DRAPE X-RAY CASS 24X20 (DRAPES) IMPLANT
ELECT REM PT RETURN 9FT ADLT (ELECTROSURGICAL) ×4
ELECTRODE REM PT RTRN 9FT ADLT (ELECTROSURGICAL) ×2 IMPLANT
EVACUATOR SILICONE 100CC (DRAIN) IMPLANT
FILTER CO2 0.2 MICRON (VASCULAR PRODUCTS) ×3 IMPLANT
GLOVE BIO SURGEON STRL SZ7 (GLOVE) ×3 IMPLANT
GLOVE BIOGEL PI IND STRL 6.5 (GLOVE) ×3 IMPLANT
GLOVE BIOGEL PI IND STRL 7.5 (GLOVE) ×3 IMPLANT
GLOVE BIOGEL PI INDICATOR 6.5 (GLOVE) ×6
GLOVE BIOGEL PI INDICATOR 7.5 (GLOVE) ×4
GLOVE SURG SS PI 7.5 STRL IVOR (GLOVE) ×7 IMPLANT
GOWN STRL NON-REIN LRG LVL3 (GOWN DISPOSABLE) ×3 IMPLANT
GOWN STRL REUS W/ TWL LRG LVL3 (GOWN DISPOSABLE) ×4 IMPLANT
GOWN STRL REUS W/ TWL XL LVL3 (GOWN DISPOSABLE) ×3 IMPLANT
GOWN STRL REUS W/TWL LRG LVL3 (GOWN DISPOSABLE) ×8
GOWN STRL REUS W/TWL XL LVL3 (GOWN DISPOSABLE) ×8
GUIDEWIRE ANGLED .035X150CM (WIRE) ×3 IMPLANT
HEMOSTAT SNOW SURGICEL 2X4 (HEMOSTASIS) ×3 IMPLANT
KIT BASIN OR (CUSTOM PROCEDURE TRAY) ×4 IMPLANT
KIT ENCORE 26 ADVANTAGE (KITS) IMPLANT
KIT PREVENA INCISION MGT 13 (CANNISTER) ×3 IMPLANT
KIT TURNOVER KIT B (KITS) ×4 IMPLANT
NDL PERC 18GX7CM (NEEDLE) IMPLANT
NEEDLE PERC 18GX7CM (NEEDLE) IMPLANT
NS IRRIG 1000ML POUR BTL (IV SOLUTION) ×8 IMPLANT
PACK PERIPHERAL VASCULAR (CUSTOM PROCEDURE TRAY) ×4 IMPLANT
PAD ARMBOARD 7.5X6 YLW CONV (MISCELLANEOUS) ×8 IMPLANT
PATCH HEMASHIELD 8X150 (Vascular Products) ×3 IMPLANT
PATCH VASC XENOSURE 1CMX6CM (Vascular Products) IMPLANT
PATCH VASC XENOSURE 1X6 (Vascular Products) IMPLANT
SET COLLECT BLD 21X3/4 12 (NEEDLE) IMPLANT
SET FLUSH CO2 (MISCELLANEOUS) ×6 IMPLANT
SHEATH BRITE TIP 7FRX11 (SHEATH) ×3 IMPLANT
STENT ELUVIA 7X40X130 (Permanent Stent) ×3 IMPLANT
STENT INNOVA 8X40X130 (Permanent Stent) ×3 IMPLANT
STENT INNOVA 8X80X130 (Permanent Stent) ×3 IMPLANT
STOPCOCK 4 WAY LG BORE MALE ST (IV SETS) ×7 IMPLANT
STOPCOCK MORSE 400PSI 3WAY (MISCELLANEOUS) ×4 IMPLANT
SUT ETHILON 3 0 PS 1 (SUTURE) IMPLANT
SUT PROLENE 5 0 C 1 24 (SUTURE) ×20 IMPLANT
SUT PROLENE 6 0 BV (SUTURE) ×4 IMPLANT
SUT PROLENE 6 0 CC (SUTURE) IMPLANT
SUT VIC AB 2-0 CT1 27 (SUTURE) ×8
SUT VIC AB 2-0 CT1 TAPERPNT 27 (SUTURE) ×4 IMPLANT
SUT VIC AB 2-0 CTX 36 (SUTURE) IMPLANT
SUT VIC AB 3-0 SH 27 (SUTURE) ×8
SUT VIC AB 3-0 SH 27X BRD (SUTURE) ×4 IMPLANT
SUT VICRYL 4-0 PS2 18IN ABS (SUTURE) ×4 IMPLANT
SYR 10ML LL (SYRINGE) ×6 IMPLANT
SYR 3ML LL SCALE MARK (SYRINGE) ×3 IMPLANT
SYR MEDRAD MARK V 150ML (SYRINGE) IMPLANT
SYRINGE REG LEUR 60CC (SYRINGE) ×3 IMPLANT
TAPE UMBILICAL COTTON 1/8X30 (MISCELLANEOUS) ×4 IMPLANT
TOWEL GREEN STERILE (TOWEL DISPOSABLE) ×4 IMPLANT
TRAY FOLEY MTR SLVR 16FR STAT (SET/KITS/TRAYS/PACK) ×4 IMPLANT
TUBING EXTENTION W/L.L. (IV SETS) ×4 IMPLANT
TUBING HIGH PRESSURE 120CM (CONNECTOR) ×3 IMPLANT
UNDERPAD 30X30 (UNDERPADS AND DIAPERS) ×4 IMPLANT
WATER STERILE IRR 1000ML POUR (IV SOLUTION) ×4 IMPLANT
WIRE HI TORQ VERSACORE J 260CM (WIRE) ×6 IMPLANT

## 2017-08-14 NOTE — OR Nursing (Signed)
MD aware of Patient's creatine level and wanted to use C02 instead of contrast dye.

## 2017-08-14 NOTE — Progress Notes (Signed)
ANTICOAGULATION CONSULT NOTE - Initial Consult  Pharmacy Consult for Warfarin Indication: atrial fibrillation  No Known Allergies  Patient Measurements: Height: 6\' 4"  (193 cm) Weight: 198 lb (89.8 kg) IBW/kg (Calculated) : 86.8  Vital Signs: Temp: 97.7 F (36.5 C) (05/03 1651) Temp Source: Oral (05/03 0834) BP: 114/78 (05/03 1651) Pulse Rate: 72 (05/03 1651)  Labs: Recent Labs    08/14/17 0845  HGB 10.7*  HCT 33.0*  PLT 182  APTT 32  LABPROT 13.6  INR 1.05  CREATININE 4.65*    Estimated Creatinine Clearance: 20.5 mL/min (A) (by C-G formula based on SCr of 4.65 mg/dL (H)).   Medical History: Past Medical History:  Diagnosis Date  . Anxiety   . Arthritis    knees , back , shoulders  . CAD (coronary artery disease)    Mild nonobstructive disease at cardiac catheterization 2002  . Chronic back pain   . CKD (chronic kidney disease), stage II   . COPD (chronic obstructive pulmonary disease) ([place])   . Diverticulitis   . Esophageal reflux   . Essential hypertension   . Hepatitis C    states he no longer has this  . History of kidney stones   . History of syncope   . Hyperlipidemia   . Nephrolithiasis   . PAT (paroxysmal atrial tachycardia) ([place])   . Peripheral arterial disease (H[facility])    Occluded left superficial femoral artery status post stent June 2016 - Dr. Trula Slade  . Pneumonia    as child   49 years old  . Polycystic kidney, unspecified type   . Syncope 09/2014   Assessment: 62 year old male with CVD stage V and chronic warfarin for history of atrial fibrillation who is now s/p R-femoral endarterectomy and patch angioplasty with retrograde iliac stent placement and bypass graft femoral-femoral artery.   Baseline INR 1.05. Hgb 10.7 and platelets 182 - stable from prior labs on 4/24. Home warfarin dose was 2.5mg  on Sundays and Thursday and 5mg  all other days. Pharmacy consulted to resume warfarin -last dose was on 08/10/17 prior to procedures.  Goal of  Therapy:  INR 2-3 Monitor platelets by anticoagulation protocol: Yes   Plan:  Warfarin 5mg  po x1 tonight.  Daily PT/INR.   Sloan Leiter, PharmD, BCPS, BCCCP Clinical Pharmacist Clinical phone 08/14/2017 until 11PM [phone] After hours, please call [phone] 08/14/2017,5:04 PM

## 2017-08-14 NOTE — Anesthesia Procedure Notes (Signed)
Arterial Line Insertion Start/End5/06/2017 9:30 AM, 08/14/2017 9:35 AM Performed by: Lance Coon, CRNA, CRNA  Patient location: Pre-op. Preanesthetic checklist: patient identified, IV checked, site marked, risks and benefits discussed, surgical consent, monitors and equipment checked, pre-op evaluation, timeout performed and anesthesia consent Lidocaine 1% used for infiltration Left, radial was placed Catheter size: 20 G Hand hygiene performed , maximum sterile barriers used  and Seldinger technique used  Attempts: 1 Procedure performed without using ultrasound guided technique. Following insertion, dressing applied and Biopatch. Post procedure assessment: normal  Patient tolerated the procedure well with no immediate complications.

## 2017-08-14 NOTE — Anesthesia Preprocedure Evaluation (Signed)
Anesthesia Evaluation  Patient identified by MRN, date of birth, ID band Patient awake    Reviewed: Allergy & Precautions, H&P , NPO status , Patient's Chart, lab work & pertinent test results  Airway Mallampati: II   Neck ROM: full    Dental   Pulmonary pneumonia, COPD, former smoker,    breath sounds clear to auscultation       Cardiovascular hypertension, + CAD and + Peripheral Vascular Disease   Rhythm:regular Rate:Normal     Neuro/Psych PSYCHIATRIC DISORDERS Anxiety Depression    GI/Hepatic GERD  ,(+) Hepatitis -, C  Endo/Other    Renal/GU Renal InsufficiencyRenal disease     Musculoskeletal  (+) Arthritis ,   Abdominal   Peds  Hematology   Anesthesia Other Findings   Reproductive/Obstetrics                             Anesthesia Physical Anesthesia Plan  ASA: III  Anesthesia Plan: General   Post-op Pain Management:    Induction: Intravenous  PONV Risk Score and Plan: 2 and Ondansetron, Dexamethasone and Treatment may vary due to age or medical condition  Airway Management Planned: Oral ETT  Additional Equipment: Arterial line  Intra-op Plan:   Post-operative Plan: Extubation in OR  Informed Consent: I have reviewed the patients History and Physical, chart, labs and discussed the procedure including the risks, benefits and alternatives for the proposed anesthesia with the patient or authorized representative who has indicated his/her understanding and acceptance.     Plan Discussed with: CRNA, Anesthesiologist and Surgeon  Anesthesia Plan Comments:         Anesthesia Quick Evaluation

## 2017-08-14 NOTE — Consult Note (Addendum)
[facility] Consult Note     Date: 08/14/2017                  Patient Name:  Aaron [name]  MRN: [id number]  DOB: 1955-08-02  Age / Sex: 62 y.o., male         PCP: [facility]                 Service Requesting Consult: [facility] Dr. Trula Slade                 Reason for Consult: CKD, polycystic kidney disease            Chief Complaint: R leg claudication HPI: Patient is a 62 yo male with polycystic kidney disease, CKD V, PAD s/p L SFA stenting in 09/2014, Afib, COPD, HTN, h/o hep C, HLD, CAD who presented with severe claudication of R leg. He had an angiogram on Tuesday showign a R external iliac occlusion and was admitted for surgery and underwent a R femoral endarterectomy with iliac stenting. He does still have some pain post op of the R inguinal area otherwise feels well. He states that his last cigarette was Tuesday and knows that he needs to quit smoking. He follows with Dr. Joelyn Oms for his PCKD which runs on his father's side of the family, as he is pre-dialysis. He is aware that he will likely need to dialysis in the future.Has anemia , secondary HPTH  Past Medical History:  Diagnosis Date  . Anxiety   . Arthritis    knees , back , shoulders  . CAD (coronary artery disease)    Mild nonobstructive disease at cardiac catheterization 2002  . Chronic back pain   . CKD (chronic kidney disease), stage II   . COPD (chronic obstructive pulmonary disease) ([place])   . Diverticulitis   . Esophageal reflux   . Essential hypertension   . Hepatitis C    states he no longer has this  . History of kidney stones   . History of syncope   . Hyperlipidemia   . Nephrolithiasis   . PAT (paroxysmal atrial tachycardia) ([place])   . Peripheral arterial disease (HCC)    Occluded left superficial femoral artery status post stent June 2016 - Dr. Trula Slade  . Pneumonia    as child   72 years old  . Polycystic kidney, unspecified type   . Syncope 09/2014    Past  Surgical History:  Procedure Laterality Date  . ABDOMINAL AORTOGRAM N/A 08/11/2017   Procedure: ABDOMINAL AORTOGRAM;  Surgeon: Serafina Mitchell, MD;  Location: [facility];  Service: Cardiovascular;  Laterality: N/A;  . BACK SURGERY    . BIOPSY  12/17/2016   Procedure: BIOPSY;  Surgeon: Daneil Dolin, MD;  [facility];  Service: Gastroenterology;;  gastric colon  . BIOPSY  04/29/2017   Procedure: BIOPSY;  Surgeon: Daneil Dolin, MD;  [facility];  Service: Endoscopy;;  duodenal biopsies  . BUNIONECTOMY    . CERVICAL SPINE SURGERY    . COLONOSCOPY  2008   Dr. Oneida Alar: rare sigmoid colon diverticulosis, internal hemorrhoids.   . COLONOSCOPY WITH PROPOFOL N/A 12/17/2016   dense left-sided diverticulosis, right colon ulcers s/p biopsy query occult NSAID use vs transient ischemia, not consistent with IBD. CMV stains negative.   . ESOPHAGEAL DILATION  12/17/2016   EGD with mild Schatzki's ring s/p dilatation, small hiatal hernia, erosive gastropathy (negative H.pylori gastritis)  .  ESOPHAGOGASTRODUODENOSCOPY  2008   Dr. Oneida Alar: normal esophagus without Barrett's, antritis and duodenitis, path with H.pylori gastritis  . ESOPHAGOGASTRODUODENOSCOPY (EGD) WITH PROPOFOL N/A 12/17/2016   Procedure: ESOPHAGOGASTRODUODENOSCOPY (EGD) WITH PROPOFOL;  Surgeon: Daneil Dolin, MD;  [facility];  Service: Gastroenterology;  Laterality: N/A;  . ESOPHAGOGASTRODUODENOSCOPY (EGD) WITH PROPOFOL N/A 04/29/2017   Patchy erythema of gastric mucosa diffusely, extensive inflammatory changes in duodenum, geographic ulceration and mucosal edema present, encroaching somewhat on the lumen yet still widely patent, distal second portion of duodenum appeared abnormal, path with peptic duodenitis with ulceration  . HERNIA REPAIR     umbilical  . laparoscopic inguinal hernia right  02/2017   [facility]  . LOWER EXTREMITY ANGIOGRAPHY Right 08/11/2017   Procedure: LOWER EXTREMITY ANGIOGRAPHY;   Surgeon: Serafina Mitchell, MD;  Location: [facility];  Service: Cardiovascular;  Laterality: Right;  . PERIPHERAL VASCULAR CATHETERIZATION N/A 09/20/2014   Procedure: Abdominal Aortogram;  Surgeon: Serafina Mitchell, MD;  Location: [facility];  Service: Cardiovascular;  Laterality: N/A;    Family History  Problem Relation Age of Onset  . Alcoholism Mother   . Heart disease Father        Massive heart attack  . Heart attack Father   . Atrial fibrillation Father   . Colon cancer Father   . Colon cancer Maternal Grandfather 51  . Alcoholism Maternal Grandfather   . Renal cancer Cousin   . Ovarian cancer Sister    Social History:  reports that he quit smoking 11 days ago. His smoking use included cigarettes. He has a 23.50 pack-year smoking history. He has quit using smokeless tobacco. His smokeless tobacco use included snuff and chew. He reports that he has current or past drug history. He reports that he does not drink alcohol.  Allergies: No Known Allergies  Medications Prior to Admission  Medication Sig Dispense Refill  . BREO ELLIPTA 200-25 MCG/INH AEPB Inhale 1 puff into the lungs daily.    . carvedilol (COREG) 25 MG tablet Take 1 tablet (25 mg total) 2 (two) times daily by mouth. 180 tablet 3  . cholecalciferol (VITAMIN D) 1000 units tablet Take 2,000 Units by mouth daily.    Marland Kitchen doxazosin (CARDURA) 8 MG tablet Take 8 mg by mouth at bedtime.     . hydrOXYzine (ATARAX/VISTARIL) 25 MG tablet Take 25 mgs by mouth once daily    . ondansetron (ZOFRAN) 4 MG tablet Take 1 tablet (4 mg total) by mouth every 6 (six) hours as needed for nausea. 20 tablet 0  . oxyCODONE-acetaminophen (PERCOCET) 10-325 MG per tablet Take 1 tablet by mouth every 4 (four) hours as needed for pain.     . pantoprazole (PROTONIX) 40 MG tablet Take 1 tablet (40 mg total) by mouth 2 (two) times daily. 60 tablet 1  . torsemide (DEMADEX) 20 MG tablet Take 1 tablet (20 mg total) by mouth daily. 30 tablet 0     Results for orders placed or performed during the hospital encounter of 08/14/17 (from the past 48 hour(s))  Type and screen     Status: None   Collection Time: 08/14/17  8:42 AM  Result Value Ref Range   ABO/RH(D) O POS    Antibody Screen NEG    Sample Expiration      08/17/2017 Performed at [facility], [place] [address], [place], [place] [address]   ABO/Rh     Status: None (Preliminary result)   Collection Time: 08/14/17  8:42 AM  Result Value Ref Range   ABO/RH(D)      O POS Performed at [facility] [address], [place], [place] [address]   APTT     Status: None   Collection Time: 08/14/17  8:45 AM  Result Value Ref Range   aPTT 32 24 - 36 seconds    Comment: Performed at [facility] [address], [place], [place] [address]  Blood gas, arterial     Status: None   Collection Time: 08/14/17  8:45 AM  Result Value Ref Range   O2 Content ROOM AIR L/min   pH, Arterial 7.381 7.350 - 7.450   pCO2 arterial 38.5 32.0 - 48.0 mmHg   pO2, Arterial 93.0 83.0 - 108.0 mmHg   Bicarbonate 22.3 20.0 - 28.0 mmol/L   Acid-base deficit 2.0 0.0 - 2.0 mmol/L   O2 Saturation 97.1 %   Patient temperature 98.6    Collection site RIGHT BRACHIAL    Drawn by [id number]    Sample type ARTERIAL DRAW    Allens test (pass/fail) PASS PASS  CBC     Status: Abnormal   Collection Time: 08/14/17  8:45 AM  Result Value Ref Range   WBC 7.5 4.0 - 10.5 K/uL   RBC 3.43 (L) 4.22 - 5.81 MIL/uL   Hemoglobin 10.7 (L) 13.0 - 17.0 g/dL   HCT 33.0 (L) 39.0 - 52.0 %   MCV 96.2 78.0 - 100.0 fL   MCH 31.2 26.0 - 34.0 pg   MCHC 32.4 30.0 - 36.0 g/dL   RDW 14.0 11.5 - 15.5 %   Platelets 182 150 - 400 K/uL    Comment: Performed at [facility], [place] [address], [place], [place] [address]  Comprehensive metabolic panel     Status: Abnormal   Collection Time: 08/14/17  8:45 AM  Result Value Ref Range   Sodium 141 135 - 145 mmol/L   Potassium 3.7 3.5 - 5.1 mmol/L   Chloride 107 101 -  111 mmol/L   CO2 21 (L) 22 - 32 mmol/L   Glucose, Bld 94 65 - 99 mg/dL   BUN 45 (H) 6 - 20 mg/dL   Creatinine, Ser 4.65 (H) 0.61 - 1.24 mg/dL   Calcium 8.0 (L) 8.9 - 10.3 mg/dL   Total Protein 6.6 6.5 - 8.1 g/dL   Albumin 3.0 (L) 3.5 - 5.0 g/dL   AST 10 (L) 15 - 41 U/L   ALT 7 (L) 17 - 63 U/L   Alkaline Phosphatase 76 38 - 126 U/L   Total Bilirubin 0.5 0.3 - 1.2 mg/dL   GFR calc non Af Amer 12 (L) >60 mL/min   GFR calc Af Amer 14 (L) >60 mL/min    Comment: (NOTE) The eGFR has been calculated using the CKD EPI equation. This calculation has not been validated in all clinical situations. eGFR's persistently <60 mL/min signify possible Chronic Kidney Disease.    Anion gap 13 5 - 15    Comment: Performed at [facility] [address], [place], [place] [address]  Protime-INR     Status: None   Collection Time: 08/14/17  8:45 AM  Result Value Ref Range   Prothrombin Time 13.6 11.4 - 15.2 seconds   INR 1.05     Comment: Performed at [facility] [address], [place], [place] [address]  Urinalysis, Routine w reflex microscopic     Status: Abnormal   Collection Time: 08/14/17  8:46 AM  Result Value Ref Range   Color, Urine STRAW (A) YELLOW   APPearance CLEAR CLEAR   Specific Gravity, Urine 1.006 1.005 - 1.030   pH 5.0 5.0 - 8.0   Glucose, UA NEGATIVE NEGATIVE mg/dL   Hgb urine dipstick SMALL (A) NEGATIVE   Bilirubin Urine NEGATIVE NEGATIVE   Ketones, ur NEGATIVE NEGATIVE mg/dL   Protein, ur NEGATIVE NEGATIVE mg/dL   Nitrite NEGATIVE NEGATIVE   Leukocytes, UA NEGATIVE NEGATIVE   RBC / HPF 0-5 0 - 5 RBC/hpf   WBC, UA 0-5 0 - 5 WBC/hpf   Bacteria, UA RARE (A) NONE SEEN   Mucus PRESENT     Comment: Performed at [facility] [address], [place], [place] [address]  Surgical pcr screen     Status: None   Collection Time: 08/14/17  9:29 AM  Result Value Ref Range   MRSA, PCR NEGATIVE NEGATIVE   Staphylococcus aureus NEGATIVE NEGATIVE    Comment:  (NOTE) The Xpert SA Assay (FDA approved for NASAL specimens in patients 72 years of age and older), is one component of a comprehensive surveillance program. It is not intended to diagnose infection nor to guide or monitor treatment. Performed at [facility], [place] [address], [place], [place] [address]    No results found.  Review of Systems  Constitutional: Negative for chills and fever.  Eyes: Negative for blurred vision and double vision.  Respiratory: Positive for wheezing. Negative for shortness of breath.   Cardiovascular: Negative for chest pain.  Gastrointestinal: Negative for abdominal pain, heartburn, nausea and vomiting.  Genitourinary: Negative for dysuria, frequency, hematuria and urgency.  Musculoskeletal:       R leg pain  Skin: Negative for itching and rash.    Blood pressure (!) 166/88, pulse (!) 57, temperature 98.4 F (36.9 C), temperature source Oral, resp. rate 18, height '6\' 4"'  (1.93 m), weight 198 lb (89.8 kg), SpO2 99 %. Physical Exam  Constitutional: He is oriented to person, place, and time. He appears well-developed and well-nourished. No distress.  HENT:  Head: Normocephalic and atraumatic.  Nose: Nose normal.  Mouth/Throat: Oropharynx is clear and moist.  Eyes: Pupils are equal, round, and reactive to light. Conjunctivae are normal.  Neck: Normal range of motion. Neck supple.  No carotid bruits  Cardiovascular: Normal rate, regular rhythm, normal heart sounds and intact distal pulses.  No murmur heard. R leg 1+ PT and DP pulses. Bilateral iliac bruits  Respiratory: Effort normal. He has wheezes. He has rales.  GI: Soft. Bowel sounds are normal. He exhibits distension. There is no tenderness. There is no rebound and no guarding.  Palpable kidneys bilaterally  Musculoskeletal: Normal range of motion. He exhibits no edema.  Neurological: He is alert and oriented to person, place, and time.  Skin: Skin is warm and dry.  Psychiatric: He has a  normal mood and affect.   Fundi HTN changes, DP pulses 1 + CV Gr 2/6 M Abdm palp PCK L>R  Assessment/Plan Patient is a 62 yo male with polycystic kidney disease, CKD V, PAD s/p L SFA stenting in 09/2014, Afib, COPD, HTN, h/o hep C, HLD, CAD who presented with severe claudication of R leg now s/p R femoral endarterectomy with iliac stenting and admitted for monitoring. Given his PCKD and poor renal function at baseline will optimize medications and monitor labs.   1.CKD, baseline Cr 4-5, in the setting polycystic kidney disease. Monitor Cr and GFR given recent dye load  during surgery today. Started on calcitrol 0.5 mcg qd. No PTH level viewable in Epic, will obtain if not in outside nephrology records.Needs perm HD access. At risk or contrast Nephropathy 2. PAD s/p R femoral endarterectomy with patch angioplasty and iliac stenting per [facility] 3. Anemia Hgb 10.7, appears to be at baseline. Likely 2/2 CKD. No iron studies viewable in epic, will obtain if not in outside nephrology records.Need Fe/TIBC 4. HTN, continue home coreg, torsemide, doxazosin 5. COPD not in acute exacerbation. Counseled on smoking cessation  6 HPTH ^^inoffice , change to calcitriol   Bufford Lope, DO PGY-2, [facility] I have seen and examined this patient and agree with the plan of care seen, eval, examined, counseled patient, discussed with Resident .  Lecretia Buczek 08/14/2017, 4:30 PM  08/14/2017 3:20 PM

## 2017-08-14 NOTE — Plan of Care (Signed)
  Problem: Clinical Measurements: Goal: Postoperative complications will be avoided or minimized Outcome: Progressing   Problem: Skin Integrity: Goal: Demonstration of wound healing without infection will improve Outcome: Progressing   

## 2017-08-14 NOTE — H&P (Signed)
[facility]  Patient name: Aaron [name] MRN: [id number] DOB: 1955-11-17 Sex: male   HISOTRY OF PRESENT ILLNESS:    Aaron [name] is a 62 y.o. male with severe claudication in his right leg.  He had an angiogram on Tuesday which showed a external iliac occlusion.  He is here today for surgery   PAST MEDICAL HISTORY:   Past Medical History:  Diagnosis Date  . Anxiety   . Arthritis    knees , back , shoulders  . CAD (coronary artery disease)    Mild nonobstructive disease at cardiac catheterization 2002  . Chronic back pain   . CKD (chronic kidney disease), stage II   . COPD (chronic obstructive pulmonary disease) ([place])   . Diverticulitis   . Esophageal reflux   . Essential hypertension   . Hepatitis C    states he no longer has this  . History of kidney stones   . History of syncope   . Hyperlipidemia   . Nephrolithiasis   . PAT (paroxysmal atrial tachycardia) ([place])   . Peripheral arterial disease (HCC)    Occluded left superficial femoral artery status post stent June 2016 - Dr. Trula Slade  . Pneumonia    as child   68 years old  . Polycystic kidney, unspecified type   . Syncope 09/2014     FAMILY HISTORY:   Family History  Problem Relation Age of Onset  . Alcoholism Mother   . Heart disease Father        Massive heart attack  . Heart attack Father   . Atrial fibrillation Father   . Colon cancer Father   . Colon cancer Maternal Grandfather 62  . Alcoholism Maternal Grandfather   . Renal cancer Cousin   . Ovarian cancer Sister     SOCIAL HISTORY:   Social History   Tobacco Use  . Smoking status: Former Smoker    Packs/day: 0.50    Years: 47.00    Pack years: 23.50    Types: Cigarettes    Last attempt to quit: 08/03/2017    Years since quitting: 0.0  . Smokeless tobacco: Former Systems developer    Types: Snuff, Chew  Substance Use Topics  . Alcohol use: No    Alcohol/week: 0.0 oz    Comment:  alcohol free 2017,  heavy drinker in the past     ALLERGIES:   No Known Allergies   CURRENT MEDICATIONS:   Current Facility-Administered Medications  Medication Dose Route Frequency Provider Last Rate Last Dose  . 0.9 %  sodium chloride infusion   Intravenous Continuous Serafina Mitchell, MD 10 mL/hr at 08/14/17 [id number]    . ceFAZolin (ANCEF) IVPB 2g/100 mL premix  2 g Intravenous To SS-Surg Serafina Mitchell, MD      . Chlorhexidine Gluconate Cloth 2 % PADS 6 each  6 each Topical Once Serafina Mitchell, MD       And  . Chlorhexidine Gluconate Cloth 2 % PADS 6 each  6 each Topical Once Serafina Mitchell, MD        REVIEW OF SYSTEMS:   [X]  denotes positive finding, [ ]  denotes negative finding Cardiac  Comments:  Chest pain or chest pressure:    Shortness of breath upon exertion:    Short of breath when lying flat:    Irregular heart rhythm:        Vascular    Pain in calf, thigh, or hip brought on by  ambulation:    Pain in feet at night that wakes you up from your sleep:     Blood clot in your veins:    Leg swelling:         Pulmonary    Oxygen at home:    Productive cough:     Wheezing:         Neurologic    Sudden weakness in arms or legs:     Sudden numbness in arms or legs:     Sudden onset of difficulty speaking or slurred speech:    Temporary loss of vision in one eye:     Problems with dizziness:         Gastrointestinal    Blood in stool:     Vomited blood:         Genitourinary    Burning when urinating:     Blood in urine:        Psychiatric    Major depression:         Hematologic    Bleeding problems:    Problems with blood clotting too easily:        Skin    Rashes or ulcers:        Constitutional    Fever or chills:      PHYSICAL EXAM:   Vitals:   08/14/17 0834 08/14/17 0835 08/14/17 0838  BP:  (!) 166/88   Pulse: (!) 57    Resp: 18    Temp: 98.4 F (36.9 C)    TempSrc: Oral    SpO2: 99%    Weight:   198 lb (89.8 kg)  Height:    6\' 4"  (1.93 m)    GENERAL: The patient is a well-nourished male, in no acute distress. The vital signs are documented above. CARDIAC: There is a regular rate and rhythm.  PULMONARY: Non-labored respirations ABDOMEN: Soft and non-tender with normal pitched bowel sounds.  MUSCULOSKELETAL: There are no major deformities or cyanosis. NEUROLOGIC: No focal weakness or paresthesias are detected. SKIN: There are no ulcers or rashes noted. PSYCHIATRIC: The patient has a normal affect.  STUDIES:   none  MEDICAL ISSUES:   Right femoral endarterectomy and patch angioplasty with possible retrograde iliac stent vs left to right femoral femoral bypass    [name], MD [facility] [phone] Pager [phone]

## 2017-08-14 NOTE — Anesthesia Procedure Notes (Signed)
Procedure Name: Intubation Date/Time: 08/14/2017 11:01 AM Performed by: Tyshawn Ciullo T, CRNA Pre-anesthesia Checklist: Patient identified, Emergency Drugs available, Suction available and Patient being monitored Patient Re-evaluated:Patient Re-evaluated prior to induction Oxygen Delivery Method: Circle system utilized Preoxygenation: Pre-oxygenation with 100% oxygen Induction Type: IV induction Ventilation: Mask ventilation without difficulty Laryngoscope Size: Miller and 3 Grade View: Grade I Tube type: Oral Tube size: 7.5 mm Number of attempts: 1 Airway Equipment and Method: Patient positioned with wedge pillow and Stylet Placement Confirmation: ETT inserted through vocal cords under direct vision,  positive ETCO2 and breath sounds checked- equal and bilateral Secured at: 22 cm Tube secured with: Tape Dental Injury: Teeth and Oropharynx as per pre-operative assessment

## 2017-08-14 NOTE — Op Note (Signed)
Patient name: Aaron [name] MRN: [id number] DOB: 25-Sep-1955 Sex: male  08/14/2017 Pre-operative Diagnosis: Severe right leg claudication bordering on rest pain Post-operative diagnosis:  Same Surgeon:  [name] Assistants: Laurence Slate Procedure:   #1: Right external iliac common femoral, profundofemoral, and superficial femoral artery endarterectomy with bovine pericardial patch angioplasty   #2: Stent, right common iliac artery   #3: Stent, right external iliac artery   #4: Drug-coated stent, right superficial femoral artery   #5: Abdominal aortogram with CO2   #6: Placement of a Praveena incisional wound VAC Anesthesia: General Blood Loss:  200 cc Specimens: None  Findings: Occluded right external iliac artery.  This was able to be successfully crossed and stented with a stent going into the common iliac artery.  The hypogastric remained patent.  The endarterectomy was done up under the inguinal ligament and then down onto the superficial femoral artery for approximately 2 cm.  I also had endarterectomized the profundofemoral artery.  He had very fragile eccentric plaque.  I was not comfortable with the distal endpoint and elected to place a drug-eluting stent that begins at the distal end of the patch.  Indications: The patient is experiencing severe symptoms in his right leg bordering on rest pain.  He had an arteriogram with CO2 on Tuesday which showed external iliac occlusion on the right.  He comes in today for femoral endarterectomy and retrograde stenting versus femoral-femoral bypass graft.  Procedure:  The patient was identified in the holding area and taken to [facility] [facility]  The patient was then placed supine on the table. general anesthesia was administered.  The patient was prepped and draped in the usual sterile fashion.  A time out was called and antibiotics were administered.  A longitudinal incision was made in the right groin.  Cautery was used about subtenons tissue  down the femoral sheath which was then opened sharply.  I exposed the common femoral artery from the inguinal ligament down to the bifurcation.  I dissected out approximately 1.5 cm of the profundofemoral artery as well as approximately 2 cm of the superficial femoral artery.  At this point the patient was fully heparinized.  After the heparin circulated the superficial femoral and profunda femoral artery were occluded.  A #11 blade was used to make an arteriotomy which was extended longitudinally with Potts scissors.  I performed a endarterectomy of the common femoral artery.  An eversion endarterectomy was performed of the profundofemoral artery until I got a good distal endpoint.  I was unable to get a good distal endpoint with the superficial femoral artery as the plaque was very fragile and eccentric.  I then proceeded with more proximal endarterectomy and reached well up under the inguinal ligament to remove as much plaque as I could within the distal external iliac artery.  I then inserted a 7 [place] sheath.  I used a 035 Glidewire and quick cross catheter to cross the occluded external iliac artery.  This was done without difficulty.  I then advanced the sheath into the common iliac artery and performed a aortogram with CO2 which showed that the common iliac and hypogastric artery were patent.  The disease portion of the external iliac artery went up to the hypogastric artery which did have a significant proximal stenosis.  Because of this I felt that stenting need to to go into the common iliac artery.  I selected a 8 x 80 INNO self-expanding stent and deployed this in the common  iliac arteryVA down to the distal external iliac artery.  It was molded with a 8 mm balloon.  I then shot a retrograde CO2 arteriogram.  I felt that there was at least 1 cm distal to the stent that needed to be treated and so a additional 8 x 40 self-expanding stent was deployed and then molded with a 8 mm balloon.  There was  excellent inflow at this time.  I then placed a Henley clamp distal to the stent but proximal to the arteriotomy.  I selected a dacryon patch and perform patch angioplasty of the common femoral artery going down onto the superficial femoral artery for approximately 1 cm.  This was done with 5-0 Prolene.  Prior to completion, I advanced a versa core wire into the superficial femoral artery and selected a 7 x 40 Elluvia stent which was molded with a 6 mm balloon.  The stent goes from the distal patch down onto the superficial femoral artery to make sure that tacked up the distal endpoint of the endarterectomy.  I then completed the patch angioplasty.  The inflow and outflow arteries were flushed.  There was excellent backbleeding and antegrade bleeding.  The wound was then copiously irrigated with heparin saline and the arteriotomy was then secured.  Doppler revealed excellent Doppler signals in the profundus superficial femoral artery.  50 mg of protamine was then administered.  Once I was satisfied with hemostasis, the femoral sheath was closed with 2-0 Vicryl.  The subtenons tissue were then closed with additional layers with 3-0 Vicryl followed by 3-0 Vicryl on the skin.  I then placed a Praveena wound VAC as an incisional VAC over the skin.  There were no immediate complications.   Disposition:  To PACU stable   V. [name], M.D. [facility] Office: [phone] Pager:  [phone] severe right leg claudication bordering on rest pain

## 2017-08-14 NOTE — Progress Notes (Signed)
Received patient from PACU with stable vitals and negative pressure wound therapy to R groin. R dorsalis pedis pulse 2+ and positive sensation and warmth to R foot. No other neurologic nor other negative signs or issues present. Patient tolerating liquids well and oriented to room, call light and safety measures, fall risk and bed rest to equal 4 hours post op as well as art line safety and restrictions. He verbalizes understanding to all. His personal belongings accounted for and in room. No family present however he states he has a daughter that is active is his life.

## 2017-08-14 NOTE — Transfer of Care (Signed)
Immediate Anesthesia Transfer of Care Note  Patient: Aaron [name]  Procedure(s) Performed: RIGHT ILLIO-FEMORAL ENDARTERECTOMY (Right Groin) INSERTION OF RIGHT COMMON ILIAC STENT 89mm x 54mm x 130cm INSERTION OF RIGHT EXTERNAL ILIAC STENT 92mm x 15mm x 130cm INSERTION OF SUPERFICIAL FERMORAL ARTERY STENT 1mm x 32mm x 130cm (Right Groin) PATCH ANGIOPLASTY USING HEMASHIELD PATCH 0.3IN X 6IN (Right Groin) APPLICATION OF PREVENA INCISIONAL WOUND VAC RIGHT GROIN (Right Groin)  Patient Location: PACU  Anesthesia Type:General  Level of Consciousness: awake, alert  and oriented  Airway & Oxygen Therapy: Patient Spontanous Breathing and Patient connected to nasal cannula oxygen  Post-op Assessment: Report given to RN, Post -op Vital signs reviewed and stable and Patient moving all extremities  Post vital signs: Reviewed and stable  Last Vitals:  Vitals Value Taken Time  BP 105/60 08/14/2017  3:25 PM  Temp 36.3 C 08/14/2017  3:25 PM  Pulse 81 08/14/2017  3:29 PM  Resp 23 08/14/2017  3:29 PM  SpO2 92 % 08/14/2017  3:29 PM  Vitals shown include unvalidated device data.  Last Pain:  Vitals:   08/14/17 0916  TempSrc:   PainSc: 6       Patients Stated Pain Goal: 6 (48/01/65 5374)  Complications: No apparent anesthesia complications

## 2017-08-14 NOTE — Discharge Instructions (Signed)
° °  [facility] ° °Discharge Instructions ° °Lower Extremity Angiogram; Angioplasty/Stenting ° °Please refer to the following instructions for your post-procedure care. Your surgeon or physician assistant will discuss any changes with you. ° °Activity ° °Avoid lifting more than 8 pounds (1 gallons of milk) for 72 hours (3 days) after your procedure. You may walk as much as you can tolerate. It's OK to drive after 72 hours. ° °Bathing/Showering ° °You may shower the day after your procedure. If you have a bandage, you may remove it at 24- 48 hours. Clean your incision site with mild soap and water. Pat the area dry with a clean towel. ° °Diet ° °Resume your pre-procedure diet. There are no special food restrictions following this procedure. All patients with peripheral vascular disease should follow a low fat/low cholesterol diet. In order to heal from your surgery, it is CRITICAL to get adequate nutrition. Your body requires vitamins, minerals, and protein. Vegetables are the best source of vitamins and minerals. Vegetables also provide the perfect balance of protein. Processed food has little nutritional value, so try to avoid this. ° °Medications ° °Resume taking all of your medications unless your doctor tells you not to. If your incision is causing pain, you may take over-the-counter pain relievers such as acetaminophen (Tylenol) ° °Follow Up ° °Follow up will be arranged at the time of your procedure. You may have an office visit scheduled or may be scheduled for surgery. Ask your surgeon if you have any questions. ° °Please call us immediately for any of the following conditions: °•Severe or worsening pain your legs or feet at rest or with walking. °•Increased pain, redness, drainage at your groin puncture site. °•Fever of 101 degrees or higher. °•If you have any mild or slow bleeding from your puncture site: lie down, apply firm constant pressure over the area with a piece of  gauze or a clean wash cloth for 30 minutes- no peeking!, call 911 right away if you are still bleeding after 30 minutes, or if the bleeding is heavy and unmanageable. ° °Reduce your risk factors of vascular disease: ° °Stop smoking. If you would like help call [facility] at 1-800-QUIT-NOW (1-800-784-8669) or [place] at 336-586-4000. °Manage your cholesterol °Maintain a desired weight °Control your diabetes °Keep your blood pressure down ° °If you have any questions, please call the office at [phone] ° °

## 2017-08-15 ENCOUNTER — Encounter (HOSPITAL_COMMUNITY): Payer: Medicaid Other

## 2017-08-15 LAB — IRON AND TIBC
Iron: 17 ug/dL — ABNORMAL LOW (ref 45–182)
SATURATION RATIOS: 8 % — AB (ref 17.9–39.5)
TIBC: 200 ug/dL — ABNORMAL LOW (ref 250–450)
UIBC: 183 ug/dL

## 2017-08-15 LAB — RENAL FUNCTION PANEL
ALBUMIN: 2.6 g/dL — AB (ref 3.5–5.0)
Anion gap: 10 (ref 5–15)
BUN: 48 mg/dL — AB (ref 6–20)
CHLORIDE: 105 mmol/L (ref 101–111)
CO2: 21 mmol/L — ABNORMAL LOW (ref 22–32)
Calcium: 7 mg/dL — ABNORMAL LOW (ref 8.9–10.3)
Creatinine, Ser: 4.72 mg/dL — ABNORMAL HIGH (ref 0.61–1.24)
GFR calc Af Amer: 14 mL/min — ABNORMAL LOW (ref >60)
GFR calc non Af Amer: 12 mL/min — ABNORMAL LOW (ref >60)
Glucose, Bld: 112 mg/dL — ABNORMAL HIGH (ref 65–99)
Phosphorus: 5.4 mg/dL — ABNORMAL HIGH (ref 2.5–4.6)
Potassium: 3.9 mmol/L (ref 3.5–5.1)
Sodium: 136 mmol/L (ref 135–145)

## 2017-08-15 LAB — CBC
HEMATOCRIT: 29.9 % — AB (ref 39.0–52.0)
Hemoglobin: 9.7 g/dL — ABNORMAL LOW (ref 13.0–17.0)
MCH: 31.3 pg (ref 26.0–34.0)
MCHC: 32.4 g/dL (ref 30.0–36.0)
MCV: 96.5 fL (ref 78.0–100.0)
Platelets: 145 10*3/uL — ABNORMAL LOW (ref 150–400)
RBC: 3.1 MIL/uL — AB (ref 4.22–5.81)
RDW: 14 % (ref 11.5–15.5)
WBC: 6.6 10*3/uL (ref 4.0–10.5)

## 2017-08-15 MED ORDER — WARFARIN SODIUM 5 MG PO TABS
5.0000 mg | ORAL_TABLET | Freq: Once | ORAL | Status: AC
  Administered 2017-08-15: 5 mg via ORAL
  Filled 2017-08-15: qty 1

## 2017-08-15 MED ORDER — SODIUM BICARBONATE 650 MG PO TABS
1300.0000 mg | ORAL_TABLET | Freq: Two times a day (BID) | ORAL | Status: DC
  Administered 2017-08-15 (×2): 1300 mg via ORAL
  Filled 2017-08-15 (×3): qty 2

## 2017-08-15 MED ORDER — SODIUM CHLORIDE 0.9 % IV SOLN
510.0000 mg | Freq: Once | INTRAVENOUS | Status: AC
  Administered 2017-08-15: 510 mg via INTRAVENOUS
  Filled 2017-08-15: qty 17

## 2017-08-15 NOTE — Progress Notes (Addendum)
[facility]  Subjective  - Doing well over all.  Pain issues.   Objective 113/65 60 97.7 F (36.5 C) (Oral) 15 96%  Intake/Output Summary (Last 24 hours) at 08/15/2017 0736 Last data filed at 08/15/2017 0618 Gross per 24 hour  Intake 2175 ml  Output 1420 ml  Net 755 ml    Right groin soft with incisional vac in place Palpable DP pulse left LE 2+,  no open wounds, sensation decreased, bu improved post surgery per patient. Heart irregularly irregular known A fib  Lungs non labored breathing  Assessment/Planning: Pre-op Dx: Occluded right external iliac artery.   POD #1  Procedure:   #1: Right external iliac common femoral, profundofemoral, and superficial femoral artery endarterectomy with bovine pericardial patch angioplasty                         #2: Stent, right common iliac artery                         #3: Stent, right external iliac artery                         #4: Drug-coated stent, right superficial femoral artery                         #5: Abdominal aortogram with CO2                         #6: Placement of a Praveena incisional wound VAC   Cr 4.72 slight increase from 4.65.  Post CO2 angiogram.  Will encourage Po water intake today.  Future plans for permanent HD access.  We will order vein mapping at his post op visit in 2-3 weeks.  UO 1,100 + last 24 hours. We will observe him for another day for pain control and mobility. He has been started on Plavix post operatively and will be discharged with a prescription for 75 mg daily. He is tolerating PO's and voided.  Plan for discharge tomorrow pending pain control.  The incisional vac will stay in place for 7-10 days.  He will f/u with Dr. Trula Slade in 2-3 weeks.  He has A fib history and takes Coumadin at home.  Coumadin has been restarted dosing per pharmacy.    Aaron [name] 08/15/2017 7:36 AM -- Still some groin soreness.  Provena in place Right foot pink warm D/c home  tomorrow  Ruta Hinds, MD [facility] [place]: [phone] Pager: [phone]  Laboratory Lab Results: Recent Labs    08/14/17 0845 08/15/17 0240  WBC 7.5 6.6  HGB 10.7* 9.7*  HCT 33.0* 29.9*  PLT 182 145*   BMET Recent Labs    08/14/17 0845 08/15/17 0240  NA 141 136  K 3.7 3.9  CL 107 105  CO2 21* 21*  GLUCOSE 94 112*  BUN 45* 48*  CREATININE 4.65* 4.72*  CALCIUM 8.0* 7.0*    COAG Lab Results  Component Value Date   INR 1.05 08/14/2017   INR 1.07 08/11/2017   INR 2.56 08/05/2017   No results found for: PTT

## 2017-08-15 NOTE — Progress Notes (Signed)
[facility] for Warfarin Indication: atrial fibrillation  No Known Allergies  Patient Measurements: Height: 6\' 4"  (193 cm) Weight: 198 lb (89.8 kg) IBW/kg (Calculated) : 86.8  Vital Signs: Temp: 97.7 F (36.5 C) (05/04 0806) Temp Source: Oral (05/04 0806) BP: 116/78 (05/04 0806) Pulse Rate: 80 (05/04 0806)  Labs: Recent Labs    08/14/17 0845 08/15/17 0240  HGB 10.7* 9.7*  HCT 33.0* 29.9*  PLT 182 145*  APTT 32  --   LABPROT 13.6  --   INR 1.05  --   CREATININE 4.65* 4.72*    Estimated Creatinine Clearance: 20.2 mL/min (A) (by C-G formula based on SCr of 4.72 mg/dL (H)).   Medical History: Past Medical History:  Diagnosis Date  . Anxiety   . Arthritis    knees , back , shoulders  . CAD (coronary artery disease)    Mild nonobstructive disease at cardiac catheterization 2002  . Chronic back pain   . CKD (chronic kidney disease), stage II   . COPD (chronic obstructive pulmonary disease) ([place])   . Diverticulitis   . Esophageal reflux   . Essential hypertension   . Hepatitis C    states he no longer has this  . History of kidney stones   . History of syncope   . Hyperlipidemia   . Nephrolithiasis   . PAT (paroxysmal atrial tachycardia) ([place])   . Peripheral arterial disease (HCC)    Occluded left superficial femoral artery status post stent June 2016 - Dr. Trula Slade  . Pneumonia    as child   3 years old  . Polycystic kidney, unspecified type   . Syncope 09/2014   Assessment: 62 year old male with CVD stage V and chronic warfarin for history of atrial fibrillation who is now s/p R-femoral endarterectomy and patch angioplasty with retrograde iliac stent placement. Pharmacy consulted to resume warfarin   Home warfarin dose was 2.5mg  on Sundays and Thursday and 5mg  all other days.  Goal of Therapy:  INR 2-3 Monitor platelets by anticoagulation protocol: Yes   Plan:  Warfarin 5mg  po x1 tonight.  Daily PT/INR.   Hildred Laser, PharmD Clinical Pharmacist Clinical phone from 8:30-4:00 is [phone] After 4pm, please call Main Rx [phone]) for assistance. 08/15/2017 10:56 AM

## 2017-08-15 NOTE — Progress Notes (Signed)
Initial Nutrition Assessment  DOCUMENTATION CODES:   Not applicable  INTERVENTION:   -30 ml Prostat BID, each supplement provides 100 kcals and 15 grams protein  NUTRITION DIAGNOSIS:   Increased nutrient needs related to wound healing as evidenced by estimated needs.  GOAL:   Patient will meet greater than or equal to 90% of their needs  MONITOR:   PO intake, Supplement acceptance, Labs, Weight trends, Skin, I & O's  REASON FOR ASSESSMENT:   Malnutrition Screening Tool    ASSESSMENT:   Aaron [name] is a 62 y.o. male with severe claudication in his right leg.  He had an angiogram on Tuesday which showed a external iliac occlusion.  Pt admitted with external iliac occlusion.   5/3- s/p procedure: right external iliac common femoral, profundofemoral, and superficial femoral artery endarterectomy with bovine pericardial patch angioplasty; stent, right common iliac artery; stent, right external iliac artery; drug-coated stent, right superficial femoral artery; abdominal aortogram with CO2; placement of a Praveena incisional wound VAC  Spoke with pt at bedside, who reports variable appetite PTA. He reports some days he "eats great", but other times his appetite is poor and has little motivation to eat or complete household chores. Pt cooks and shops for himself; commonly consumed foods include eggs and bologna or hamburgers and french fries Observed breakfast tray; pt consumed 75% of meal (all except breakfast potatoes, which pt reported he would have eaten if he liked the flavor of them).   Pt reports significant wt loss over the past year, which has stabilized over the past 6 months. However, wt has been stable over the past several years. Pt suspects wt loss is related to his kidney function and diuretics. Noted mild muscle depletion in lower extremities; per pt, he has always had very thin legs. He has been very active most of his life- he shared with this RD that he used to wash  water towers for a living.   Discussed with pt increased nutritional needs for healing. Encouraged pt consume food off meal trays. He is amenable to supplements.   Per vascular surgery notes, plan for future HD access (pt CKD stage 4-5). Plan to d/c to home tomorrow (08/16/17).  Labs reviewed: Phos: 5.4.  NUTRITION - FOCUSED PHYSICAL EXAM:    Most Recent Value  Orbital Region  Mild depletion  Upper Arm Region  No depletion  Thoracic and Lumbar Region  No depletion  Buccal Region  No depletion  Temple Region  Mild depletion  Clavicle Bone Region  No depletion  Clavicle and Acromion Bone Region  No depletion  Scapular Bone Region  No depletion  Dorsal Hand  No depletion  Patellar Region  Mild depletion  Anterior Thigh Region  Mild depletion  Posterior Calf Region  No depletion  Edema (RD Assessment)  None  Hair  Reviewed  Eyes  Reviewed  Mouth  Reviewed  Skin  Reviewed  Nails  Reviewed       Diet Order:   Diet Order           Diet Heart Room service appropriate? Yes; Fluid consistency: Thin  Diet effective now          EDUCATION NEEDS:   Education needs have been addressed  Skin:  Skin Assessment: Skin Integrity Issues: Skin Integrity Issues:: Wound VAC Wound Vac: rt groin  Last BM:  08/13/17  Height:   Ht Readings from Last 1 Encounters:  08/14/17 6\' 4"  (1.93 m)    Weight:  Wt Readings from Last 1 Encounters:  08/14/17 198 lb (89.8 kg)    Ideal Body Weight:  91.8 kg  BMI:  Body mass index is 24.1 kg/m.  Estimated Nutritional Needs:   Kcal:  2250-2450  Protein:  120-135 grams  Fluid:  > 2.2 L    Aaron [name] A. Jimmye Norman, RD, LDN, CDE Pager: [phone] After hours Pager: [phone]

## 2017-08-15 NOTE — Progress Notes (Signed)
Subjective: Interval History: has no complaint, not going to smoke anymore.  Objective: Vital signs in last 24 hours: Temp:  [97.3 F (36.3 C)-98.7 F (37.1 C)] 97.7 F (36.5 C) (05/04 0356) Pulse Rate:  [56-80] 60 (05/04 0600) Resp:  [12-30] 15 (05/04 0600) BP: (105-166)/(56-90) 113/65 (05/04 0600) SpO2:  [92 %-100 %] 96 % (05/04 0600) Arterial Line BP: (93-151)/(46-75) 127/52 (05/04 0600) Weight:  [89.8 kg (198 lb)] 89.8 kg (198 lb) (05/03 0838) Weight change:   Intake/Output from previous day: 05/03 0701 - 05/04 0700 In: 2175 [P.O.:600; I.V.:1500] Out: 1420 [Urine:1170; Blood:250] Intake/Output this shift: No intake/output data recorded.  General appearance: alert, cooperative and no distress Resp: diminished breath sounds bilaterally and rhonchi bilaterally Cardio: S1, S2 normal and systolic murmur: systolic ejection 2/6, decrescendo at 2nd left intercostal space GI: palp PCK, pos bs Extremities: no edema, R foot warm as L  Lab Results: Recent Labs    08/14/17 0845 08/15/17 0240  WBC 7.5 6.6  HGB 10.7* 9.7*  HCT 33.0* 29.9*  PLT 182 145*   BMET:  Recent Labs    08/14/17 0845 08/15/17 0240  NA 141 136  K 3.7 3.9  CL 107 105  CO2 21* 21*  GLUCOSE 94 112*  BUN 45* 48*  CREATININE 4.65* 4.72*  CALCIUM 8.0* 7.0*   No results for input(s): PTH in the last 72 hours. Iron Studies:  Recent Labs    08/15/17 0240  IRON 17*  TIBC 200*    Studies/Results: No results found.  I have reviewed the patient's current medications.  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  Stable Cr vol. Add Na bicarb.  Needs perm access 2 Anemia Fe low give iv 3 hPTH vit D 4 PVD per VVS 5 COPD P vit D, Fe, perm access soon,  Follow Cr    LOS: 1 day   Aaron [name] 08/15/2017,7:59 AM

## 2017-08-15 NOTE — Anesthesia Postprocedure Evaluation (Signed)
Anesthesia Post Note  Patient: [name]  Procedure(s) Performed: RIGHT ILLIO-FEMORAL ENDARTERECTOMY (Right Groin) INSERTION OF RIGHT COMMON ILIAC STENT 65mm x 35mm x 130cm INSERTION OF RIGHT EXTERNAL ILIAC STENT 28mm x 52mm x 130cm INSERTION OF SUPERFICIAL FERMORAL ARTERY STENT 10mm x 96mm x 130cm (Right Groin) PATCH ANGIOPLASTY USING HEMASHIELD PATCH 0.3IN X 6IN (Right Groin) APPLICATION OF PREVENA INCISIONAL WOUND VAC RIGHT GROIN (Right Groin)     Patient location during evaluation: PACU Anesthesia Type: General Level of consciousness: awake and alert Pain management: pain level controlled Vital Signs Assessment: post-procedure vital signs reviewed and stable Respiratory status: spontaneous breathing, nonlabored ventilation, respiratory function stable and patient connected to nasal cannula oxygen Cardiovascular status: blood pressure returned to baseline and stable Postop Assessment: no apparent nausea or vomiting Anesthetic complications: no    Last Vitals:  Vitals:   08/15/17 0806 08/15/17 0919  BP: 116/78   Pulse: 80   Resp: 18   Temp: 36.5 C   SpO2: 98% 96%    Last Pain:  Vitals:   08/15/17 0806  TempSrc: Oral  PainSc:                  Lynne Takemoto S

## 2017-08-15 NOTE — Evaluation (Signed)
Physical Therapy Evaluation Patient Details Name: Aaron [name] MRN: [id number] DOB: Sep 12, 1955 Today's Date: 08/15/2017   History of Present Illness  62 y.o. male with severe claudication in his right leg.  He had an angiogram on Tuesday which showed a external iliac occlusion. s/p Right external iliac common femoral, profundofemoral, and superficial femoral artery endarterectomy with bovine pericardial patch angioplasty 08/14/17  Clinical Impression  PTA pt had decreased ability to walk community distances secondary to claudication pain in his R LE and was needing to use a scooter at the grocery store. Pt independent in all iADLs. Pt currently limited in his safe mobility by increased R LE pain and decreased ROM and balance post surgery. Pt is currently mod I for bed mobility and transfers and min guard for ambulation of 930 feet with RW. PT anticipates no follow up PT needs at discharge however will continue to follow acutely until d/c.     Follow Up Recommendations No PT follow up    Equipment Recommendations  Rolling walker with 5" wheels       Precautions / Restrictions Precautions Precautions: None Restrictions Weight Bearing Restrictions: No      Mobility  Bed Mobility Overal bed mobility: Modified Independent             General bed mobility comments: HoB elevated, increased effort to move R LE into bed  Transfers Overall transfer level: Modified independent Equipment used: Rolling walker (2 wheeled)             General transfer comment: good power up and steadying with RW  Ambulation/Gait Ambulation/Gait assistance: Min guard Ambulation Distance (Feet): 960 Feet Assistive device: Rolling walker (2 wheeled) Gait Pattern/deviations: Step-through pattern;Decreased stance time - right;Decreased weight shift to right Gait velocity: slowed Gait velocity interpretation: <1.8 ft/sec, indicate of risk for recurrent falls General Gait Details: min guard for safety,  slow, steady gait with slightly decreased weight shift to R, 1x standing rest break as pt reports "it feels like my leg is going to cramp up"      Balance Overall balance assessment: Needs assistance   Sitting balance-Leahy Scale: Normal     Standing balance support: No upper extremity supported;During functional activity Standing balance-Leahy Scale: Good                               Pertinent Vitals/Pain Pain Assessment: 0-10 Pain Score: 7  Pain Location: R thigh Pain Descriptors / Indicators: Aching;Sore Pain Intervention(s): Limited activity within patient's tolerance;Monitored during session;RN gave pain meds during session    [place] expects to be discharged to:: Private residence Living Arrangements: Alone Available Help at Discharge: Available PRN/intermittently;Family Type of Home: Apartment Home Access: Level entry     Home Layout: One level Home Equipment: Cane - single point;Grab bars - toilet;Grab bars - tub/shower      Prior Function Level of Independence: Independent with assistive device(s)         Comments: used cane and scooter at grocery store for last 2 months        Extremity/Trunk Assessment   Upper Extremity Assessment Upper Extremity Assessment: Overall WFL for tasks assessed    Lower Extremity Assessment Lower Extremity Assessment: RLE deficits/detail RLE Deficits / Details: R hip ROM limited due to pain, knee and ankle WFL, strength grossly assessed during mobility at 4/5 RLE Sensation: decreased light touch(tingling sensation returning post surgery )  Communication   Communication: No difficulties  Cognition Arousal/Alertness: Awake/alert Behavior During Therapy: WFL for tasks assessed/performed Overall Cognitive Status: Within Functional Limits for tasks assessed                                               Assessment/Plan    PT Assessment Patient needs continued PT  services  PT Problem List Decreased activity tolerance;Decreased range of motion;Decreased balance;Pain       PT Treatment Interventions DME instruction;Gait training;Functional mobility training;Therapeutic activities;Therapeutic exercise;Balance training;Patient/family education    PT Goals (Current goals can be found in the Care Plan section)  Acute Rehab PT Goals Patient Stated Goal: go home PT Goal Formulation: With patient Time For Goal Achievement: 08/29/17 Potential to Achieve Goals: Good    Frequency Min 3X/week   Barriers to discharge        Co-evaluation               AM-PAC PT "6 Clicks" Daily Activity  Outcome Measure Difficulty turning over in bed (including adjusting bedclothes, sheets and blankets)?: A Little Difficulty moving from lying on back to sitting on the side of the bed? : A Little Difficulty sitting down on and standing up from a chair with arms (e.g., wheelchair, bedside commode, etc,.)?: A Little Help needed moving to and from a bed to chair (including a wheelchair)?: A Little Help needed walking in hospital room?: A Little Help needed climbing 3-5 steps with a railing? : A Lot 6 Click Score: 17    End of Session Equipment Utilized During Treatment: Gait belt Activity Tolerance: Patient tolerated treatment well Patient left: in bed;with call bell/phone within reach;with nursing/sitter in room Nurse Communication: Mobility status PT Visit Diagnosis: Other abnormalities of gait and mobility (R26.89);History of falling (Z91.81);Pain Pain - Right/Left: Right Pain - part of body: Leg    Time: 1125-1145 PT Time Calculation (min) (ACUTE ONLY): 20 min   Charges:   PT Evaluation $PT Eval Moderate Complexity: 1 Mod     PT G Codes:        Mackenzy Grumbine B. Migdalia Dk PT, DPT Acute Rehabilitation  [phone] Pager [phone]    [place] 08/15/2017, 12:02 PM

## 2017-08-15 NOTE — Progress Notes (Signed)
Patient ambulated 960 ft in the hall using a walker with a standby assist, ambulation well tolerated will continue to monitor.

## 2017-08-16 ENCOUNTER — Encounter (HOSPITAL_COMMUNITY): Payer: Medicaid Other

## 2017-08-16 ENCOUNTER — Other Ambulatory Visit: Payer: Self-pay | Admitting: Cardiology

## 2017-08-16 LAB — RENAL FUNCTION PANEL
ALBUMIN: 2.8 g/dL — AB (ref 3.5–5.0)
Anion gap: 11 (ref 5–15)
BUN: 50 mg/dL — ABNORMAL HIGH (ref 6–20)
CALCIUM: 7.2 mg/dL — AB (ref 8.9–10.3)
CO2: 22 mmol/L (ref 22–32)
CREATININE: 5.01 mg/dL — AB (ref 0.61–1.24)
Chloride: 101 mmol/L (ref 101–111)
GFR, EST AFRICAN AMERICAN: 13 mL/min — AB (ref >60)
GFR, EST NON AFRICAN AMERICAN: 11 mL/min — AB (ref >60)
Glucose, Bld: 111 mg/dL — ABNORMAL HIGH (ref 65–99)
PHOSPHORUS: 4.8 mg/dL — AB (ref 2.5–4.6)
Potassium: 3.5 mmol/L (ref 3.5–5.1)
SODIUM: 134 mmol/L — AB (ref 135–145)

## 2017-08-16 LAB — POCT ACTIVATED CLOTTING TIME
ACTIVATED CLOTTING TIME: 230 s
Activated Clotting Time: 213 seconds
Activated Clotting Time: 224 seconds

## 2017-08-16 MED ORDER — CLOPIDOGREL BISULFATE 75 MG PO TABS: 75.0000 mg | ORAL_TABLET | Freq: Every day | ORAL | 3 refills | Status: DC

## 2017-08-16 MED ORDER — OXYCODONE-ACETAMINOPHEN 5-325 MG PO TABS
1.0000 | ORAL_TABLET | Freq: Four times a day (QID) | ORAL | 0 refills | Status: DC | PRN
Start: 2017-08-16 — End: 2017-09-23

## 2017-08-16 MED ORDER — CLOPIDOGREL BISULFATE 75 MG PO TABS: 75.0000 mg | ORAL_TABLET | Freq: Every day | ORAL | 11 refills | Status: DC

## 2017-08-16 NOTE — Progress Notes (Signed)
Patient ambulated 940 ft in the hall using a walker, ambulation well tolerated will continue to monitor.

## 2017-08-16 NOTE — Progress Notes (Signed)
  [facility] Progress Note    Assessment/ Plan:   Patient is a 62 yo male with polycystic kidney disease, CKD V, PAD s/p L SFA stenting in 09/2014, Afib, COPD, HTN, h/o hep C, HLD, CAD who presented with severe claudication of R leg now s/p R femoral endarterectomy with iliac stenting and admitted for monitoring.   1.CKD IV-V, in the setting polycystic kidney disease. Slight increase in SCr from 4.7 to 5.0, will recheck this week as outpatient. Started on sodium bicarb po tablets. Needs perm access, planned as outpatient in 2 weeks 2. PAD s/p R femoral endarterectomy with patch angioplasty and iliac stenting per [facility]. 3. Anemia of CKD and iron deficiency. Iron saturation 8%. S/p feraheme x1 on 08/15/17 4. Secondary hyperparathyroidism on calcitriol 0.67mcg qd 5. HTN, continue home coreg, torsemide, doxazosin 6. COPD not in acute exacerbation. Counseled on smoking cessation Stable for DC today with outpatient follow up to recheck labs this week.  Subjective:   Feels well this morning and wants to go home. R leg pain is much improved. No other complaints today. Voices good understanding regarding need for smoking cessation.    Objective:   BP 136/84 (BP Location: Left Arm)   Pulse 99   Temp 98.4 F (36.9 C) (Oral)   Resp 17   Ht 6\' 4"  (1.93 m)   Wt 198 lb (89.8 kg)   SpO2 99%   BMI 24.10 kg/m   Intake/Output Summary (Last 24 hours) at 08/16/2017 0733 Last data filed at 08/16/2017 0610 Gross per 24 hour  Intake 1320 ml  Output 725 ml  Net 595 ml   Weight change:   Physical Exam: RJJ:OACZYSA up in chair comfortably, in NAD CVS: regular, S1 and S2, grade II systolic murmur Resp: some coarse breath sounds diffusely but with good air movement throughout and normal effort on room air Abd: firm palpable kidneys bilaterally, greater on L. Nontender. + bowel sounds. No guarding. Ext: no edema. Warm and well perfused.  Imaging: No results found.  Labs: BMET Recent Labs   Lab 08/14/17 0845 08/15/17 0240 08/16/17 0423  NA 141 136 134*  K 3.7 3.9 3.5  CL 107 105 101  CO2 21* 21* 22  GLUCOSE 94 112* 111*  BUN 45* 48* 50*  CREATININE 4.65* 4.72* 5.01*  CALCIUM 8.0* 7.0* 7.2*  PHOS  --  5.4* 4.8*   CBC Recent Labs  Lab 08/14/17 0845 08/15/17 0240  WBC 7.5 6.6  HGB 10.7* 9.7*  HCT 33.0* 29.9*  MCV 96.2 96.5  PLT 182 145*    Medications:    . aspirin  81 mg Oral Daily  . calcitRIOL  0.5 mcg Oral Daily  . carvedilol  25 mg Oral BID  . clopidogrel  75 mg Oral Daily  . docusate sodium  100 mg Oral Daily  . doxazosin  8 mg Oral QHS  . fluticasone furoate-vilanterol  1 puff Inhalation Daily  . heparin  5,000 Units Subcutaneous Q8H  . pantoprazole  40 mg Oral BID  . sodium bicarbonate  1,300 mg Oral BID  . torsemide  20 mg Oral Daily  . Warfarin - Pharmacist Dosing Inpatient   Does not apply Victor, DO PGY-2, [facility] 08/16/2017 7:33 AM

## 2017-08-16 NOTE — Progress Notes (Addendum)
[facility] [place]  Subjective  - Doing well, pain better controlled.  He states when he walks his right calf doesn't cramp like it used to.   Objective 136/84 99 98.4 F (36.9 C) (Oral) 17 99%  Intake/Output Summary (Last 24 hours) at 08/16/2017 0724 Last data filed at 08/16/2017 0610 Gross per 24 hour  Intake 1320 ml  Output 725 ml  Net 595 ml    Palpable right DP.  Incisions healing well.  Right groin incisional vac in place. Herat irregularly irregular known A fib Lungs non labored breathing  Gen NAC   Assessment/Planning: Pre-op Dx: Occluded right external iliac artery.    POD # 2  Procedure:#1: Right external iliac common femoral, profundofemoral, and superficial femoral artery endarterectomy with bovine pericardial patch angioplasty #2: Stent, right common iliac artery #3: Stent, right external iliac artery #4: Drug-coated stent, right superficial femoral artery #5: Abdominal aortogram with CO2 #6: Placement of a Praveena incisional wound VAC  Cr continues to rise no 5.01.  UO 725 last 24 hours.  Plan for vein mapping and permanent access planning at f/u visit with Dr. Trula Slade in 2 weeks. Bypass patent with palpable pulse. Wound vac will remain over incision for 7-10 days then discontinued.   Encouraged ambulation as he tolerates.  He walked several times in the hall since yesterday and is pleased with his decreased in claudication symptoms.   Disposition stable for discharge home today.    Roxy Horseman 08/16/2017 7:24 AM -- Agree with above Foot warm Provena in place Workup for access as outpt  D/c home  Ruta Hinds, MD [facility] [place]: [phone] Pager: [phone]  Laboratory Lab Results: Recent Labs    08/14/17 0845 08/15/17 0240  WBC 7.5 6.6  HGB  10.7* 9.7*  HCT 33.0* 29.9*  PLT 182 145*   BMET Recent Labs    08/15/17 0240 08/16/17 0423  NA 136 134*  K 3.9 3.5  CL 105 101  CO2 21* 22  GLUCOSE 112* 111*  BUN 48* 50*  CREATININE 4.72* 5.01*  CALCIUM 7.0* 7.2*    COAG Lab Results  Component Value Date   INR 1.05 08/14/2017   INR 1.07 08/11/2017   INR 2.56 08/05/2017   No results found for: PTT

## 2017-08-16 NOTE — Progress Notes (Signed)
Discharged to home with family office visits in place teaching done  

## 2017-08-16 NOTE — Discharge Summary (Signed)
[facility] Discharge Summary   Patient ID:  Aaron [name] MRN: [id number] DOB/AGE: May 12, 1955 62 y.o.  Admit date: 08/14/2017 Discharge date: 08/16/2017 Date of Surgery: 08/14/2017 Surgeon: Surgeon(s): Serafina Mitchell, MD  Admission Diagnosis: peripheral arterial occlusive disease  Discharge Diagnoses:  peripheral arterial occlusive disease  Secondary Diagnoses: Past Medical History:  Diagnosis Date  . Anxiety   . Arthritis    knees , back , shoulders  . CAD (coronary artery disease)    Mild nonobstructive disease at cardiac catheterization 2002  . Chronic back pain   . CKD (chronic kidney disease), stage II   . COPD (chronic obstructive pulmonary disease) ([place])   . Diverticulitis   . Esophageal reflux   . Essential hypertension   . Hepatitis C    states he no longer has this  . History of kidney stones   . History of syncope   . Hyperlipidemia   . Nephrolithiasis   . PAT (paroxysmal atrial tachycardia) ([place])   . Peripheral arterial disease (HCC)    Occluded left superficial femoral artery status post stent June 2016 - Dr. Trula Slade  . Pneumonia    as child   69 years old  . Polycystic kidney, unspecified type   . Syncope 09/2014    Procedure(s): RIGHT ILLIO-FEMORAL ENDARTERECTOMY INSERTION OF RIGHT COMMON ILIAC STENT 60mm x 23mm x 130cm INSERTION OF RIGHT EXTERNAL ILIAC STENT 60mm x 46mm x 130cm INSERTION OF SUPERFICIAL FERMORAL ARTERY STENT 24mm x 78mm x 130cm PATCH ANGIOPLASTY USING HEMASHIELD PATCH 4.2PN X 6IN APPLICATION OF PREVENA INCISIONAL WOUND VAC RIGHT GROIN  Discharged Condition: good  HPI: Aaron [name] is a 62 y.o. (29-Oct-1955) male who presents with chief complaint of R calf claudication and occasional rest pain.  Surgical history significant for left SFA stenting by Dr. Trula Slade in June 2016.   Angiogram performed by Dr. Trula Slade revealed: Impression:             #1  Occluded right external iliac artery.  The patient be scheduled for  right femoral endarterectomy with retrograde stenting and possible left to right femoral-femoral bypass graft      Hospital Course:  Aaron [name] is a 62 y.o. male is S/P Right Procedure(s): RIGHT ILLIO-FEMORAL ENDARTERECTOMY INSERTION OF RIGHT COMMON ILIAC STENT 71mm x 64mm x 130cm INSERTION OF RIGHT EXTERNAL ILIAC STENT 72mm x 68mm x 130cm INSERTION OF SUPERFICIAL FERMORAL ARTERY STENT 59mm x 44mm x 130cm PATCH ANGIOPLASTY USING HEMASHIELD PATCH 3.6RW X 6IN APPLICATION OF PREVENA INCISIONAL WOUND VAC RIGHT GROIN  Consults:  Treatment Team:  Mauricia Area, MD   Post op exam palpable right  DP. He was placed on Plavix post op and his Coumadin was restarted for known A fib.   He had post operative pain control issue and was observed  for an additional 24 hours. Post op day # 2 he had good pain control and is ambulating without assistants.  He has CKD with Cr 5.1 and was seen by Nephrology while in the hospital.  We have been asked to provide him with permanent access.  Vein mapping will be performed at his f/u visit to the office in 2-3 weeks. He has an incisional vac over the right groin incision which will be maintained for 7-10 days until the battery runs out.  He will discontinue it at home.     Significant Diagnostic Studies: CBC Lab Results  Component Value Date   WBC 6.6 08/15/2017   HGB 9.7 (L)  08/15/2017   HCT 29.9 (L) 08/15/2017   MCV 96.5 08/15/2017   PLT 145 (L) 08/15/2017    BMET    Component Value Date/Time   NA 134 (L) 08/16/2017 0423   K 3.5 08/16/2017 0423   CL 101 08/16/2017 0423   CO2 22 08/16/2017 0423   GLUCOSE 111 (H) 08/16/2017 0423   BUN 50 (H) 08/16/2017 0423   CREATININE 5.01 (H) 08/16/2017 0423   CALCIUM 7.2 (L) 08/16/2017 0423   GFRNONAA 11 (L) 08/16/2017 0423   GFRAA 13 (L) 08/16/2017 0423   COAG Lab Results  Component Value Date   INR 1.05 08/14/2017   INR 1.07 08/11/2017   INR 2.56 08/05/2017     Disposition:  Discharge to  :Home Discharge Instructions    Call MD for:  redness, tenderness, or signs of infection (pain, swelling, bleeding, redness, odor or green/yellow discharge around incision site)   Complete by:  As directed    Call MD for:  severe or increased pain, loss or decreased feeling  in affected limb(s)   Complete by:  As directed    Call MD for:  temperature >100.5   Complete by:  As directed    Discharge instructions   Complete by:  As directed    You may remove the right groin wound vac in 7-10 days when the battery runs out and throw it away.  Then you may shower regularly and dry right groin incisions well.   Resume previous diet   Complete by:  As directed      Allergies as of 08/16/2017   No Known Allergies     Medication List    STOP taking these medications   oxyCODONE-acetaminophen 10-325 MG tablet Commonly known as:  PERCOCET Replaced by:  oxyCODONE-acetaminophen 5-325 MG tablet     TAKE these medications   BREO ELLIPTA 200-25 MCG/INH Aepb Generic drug:  fluticasone furoate-vilanterol Inhale 1 puff into the lungs daily.   carvedilol 25 MG tablet Commonly known as:  COREG Take 1 tablet (25 mg total) 2 (two) times daily by mouth.   cholecalciferol 1000 units tablet Commonly known as:  VITAMIN D Take 2,000 Units by mouth daily.   clopidogrel 75 MG tablet Commonly known as:  PLAVIX Take 1 tablet (75 mg total) by mouth daily.   clopidogrel 75 MG tablet Commonly known as:  PLAVIX Take 1 tablet (75 mg total) by mouth daily.   doxazosin 8 MG tablet Commonly known as:  CARDURA Take 8 mg by mouth at bedtime.   hydrOXYzine 25 MG tablet Commonly known as:  ATARAX/VISTARIL Take 25 mgs by mouth once daily   ondansetron 4 MG tablet Commonly known as:  ZOFRAN Take 1 tablet (4 mg total) by mouth every 6 (six) hours as needed for nausea.   oxyCODONE-acetaminophen 5-325 MG tablet Commonly known as:  PERCOCET/ROXICET Take 1 tablet by mouth every 6 (six) hours as  needed. Replaces:  oxyCODONE-acetaminophen 10-325 MG tablet   pantoprazole 40 MG tablet Commonly known as:  PROTONIX Take 1 tablet (40 mg total) by mouth 2 (two) times daily.   torsemide 20 MG tablet Commonly known as:  DEMADEX Take 1 tablet (20 mg total) by mouth daily.      Verbal and written Discharge instructions given to the patient. Wound care per Discharge AVS Follow-up Information    Serafina Mitchell, MD Follow up in 2 week(s).   Specialties:  Vascular Surgery, Cardiology Why:  office will call Contact information: [place]  [place] [address] [phone]           Signed: Roxy Horseman 08/16/2017, 2:53 PM

## 2017-08-17 ENCOUNTER — Encounter (HOSPITAL_COMMUNITY): Payer: Self-pay | Admitting: Surgery

## 2017-08-18 ENCOUNTER — Telehealth: Payer: Self-pay | Admitting: Surgery

## 2017-08-18 NOTE — Telephone Encounter (Signed)
Sched lab 09/09/17 at 4:00 and MD 09/11/17 at 1:45. Lm on cell# to inform pt of appts.

## 2017-08-18 NOTE — Telephone Encounter (Signed)
-----   Message from Mena Goes, RN sent at 08/17/2017  8:50 AM EDT ----- Regarding: 2-3 weeks with bilater upper extremity vein map   ----- Message ----- From: Ulyses Amor, PA-C Sent: 08/15/2017   8:11 AM To: [facility] Charge Pool  S/P right iliac and SFA stent placement by Dr. Trula Slade.  CKD in need of permanent access.  Needs B UE vein mapping at his f/u appointment in 2-3 weeks.

## 2017-08-19 ENCOUNTER — Other Ambulatory Visit: Payer: Self-pay

## 2017-08-19 DIAGNOSIS — N185 Chronic kidney disease, stage 5: Secondary | ICD-10-CM

## 2017-08-24 ENCOUNTER — Other Ambulatory Visit: Payer: Self-pay

## 2017-08-24 ENCOUNTER — Encounter (HOSPITAL_COMMUNITY): Payer: Self-pay | Admitting: Emergency Medicine

## 2017-08-24 ENCOUNTER — Inpatient Hospital Stay (HOSPITAL_COMMUNITY)
Admission: EM | Admit: 2017-08-24 | Discharge: 2017-09-05 | DRG: 674 | Disposition: A | Discharge status: Home / Self Care | Payer: 59 | Attending: Internal Medicine | Admitting: Internal Medicine

## 2017-08-24 ENCOUNTER — Inpatient Hospital Stay (HOSPITAL_COMMUNITY): Payer: 59

## 2017-08-24 DIAGNOSIS — F419 Anxiety disorder, unspecified: Secondary | ICD-10-CM | POA: Diagnosis present

## 2017-08-24 DIAGNOSIS — E8770 Fluid overload, unspecified: Secondary | ICD-10-CM | POA: Diagnosis present

## 2017-08-24 DIAGNOSIS — Z419 Encounter for procedure for purposes other than remedying health state, unspecified: Secondary | ICD-10-CM

## 2017-08-24 DIAGNOSIS — N2581 Secondary hyperparathyroidism of renal origin: Secondary | ICD-10-CM | POA: Diagnosis present

## 2017-08-24 DIAGNOSIS — Q613 Polycystic kidney, unspecified: Secondary | ICD-10-CM

## 2017-08-24 DIAGNOSIS — E785 Hyperlipidemia, unspecified: Secondary | ICD-10-CM | POA: Diagnosis present

## 2017-08-24 DIAGNOSIS — D631 Anemia in chronic kidney disease: Secondary | ICD-10-CM | POA: Diagnosis present

## 2017-08-24 DIAGNOSIS — Q446 Cystic disease of liver: Secondary | ICD-10-CM | POA: Diagnosis not present

## 2017-08-24 DIAGNOSIS — I2510 Atherosclerotic heart disease of native coronary artery without angina pectoris: Secondary | ICD-10-CM | POA: Diagnosis present

## 2017-08-24 DIAGNOSIS — K4090 Unilateral inguinal hernia, without obstruction or gangrene, not specified as recurrent: Secondary | ICD-10-CM

## 2017-08-24 DIAGNOSIS — N141 Nephropathy induced by other drugs, medicaments and biological substances: Principal | ICD-10-CM | POA: Diagnosis present

## 2017-08-24 DIAGNOSIS — N5089 Other specified disorders of the male genital organs: Secondary | ICD-10-CM | POA: Diagnosis present

## 2017-08-24 DIAGNOSIS — M79661 Pain in right lower leg: Secondary | ICD-10-CM | POA: Diagnosis present

## 2017-08-24 DIAGNOSIS — I739 Peripheral vascular disease, unspecified: Secondary | ICD-10-CM | POA: Diagnosis present

## 2017-08-24 DIAGNOSIS — K219 Gastro-esophageal reflux disease without esophagitis: Secondary | ICD-10-CM | POA: Diagnosis present

## 2017-08-24 DIAGNOSIS — I120 Hypertensive chronic kidney disease with stage 5 chronic kidney disease or end stage renal disease: Secondary | ICD-10-CM | POA: Diagnosis present

## 2017-08-24 DIAGNOSIS — W19XXXA Unspecified fall, initial encounter: Secondary | ICD-10-CM | POA: Diagnosis present

## 2017-08-24 DIAGNOSIS — N186 End stage renal disease: Secondary | ICD-10-CM | POA: Diagnosis not present

## 2017-08-24 DIAGNOSIS — I10 Essential (primary) hypertension: Secondary | ICD-10-CM | POA: Diagnosis not present

## 2017-08-24 DIAGNOSIS — Z7902 Long term (current) use of antithrombotics/antiplatelets: Secondary | ICD-10-CM

## 2017-08-24 DIAGNOSIS — G8929 Other chronic pain: Secondary | ICD-10-CM | POA: Diagnosis present

## 2017-08-24 DIAGNOSIS — J449 Chronic obstructive pulmonary disease, unspecified: Secondary | ICD-10-CM | POA: Diagnosis present

## 2017-08-24 DIAGNOSIS — M7989 Other specified soft tissue disorders: Secondary | ICD-10-CM | POA: Diagnosis not present

## 2017-08-24 DIAGNOSIS — I480 Paroxysmal atrial fibrillation: Secondary | ICD-10-CM | POA: Diagnosis present

## 2017-08-24 DIAGNOSIS — Z992 Dependence on renal dialysis: Secondary | ICD-10-CM

## 2017-08-24 DIAGNOSIS — M549 Dorsalgia, unspecified: Secondary | ICD-10-CM | POA: Diagnosis present

## 2017-08-24 DIAGNOSIS — M170 Bilateral primary osteoarthritis of knee: Secondary | ICD-10-CM | POA: Diagnosis not present

## 2017-08-24 DIAGNOSIS — Z87891 Personal history of nicotine dependence: Secondary | ICD-10-CM

## 2017-08-24 DIAGNOSIS — I509 Heart failure, unspecified: Secondary | ICD-10-CM

## 2017-08-24 DIAGNOSIS — R1909 Other intra-abdominal and pelvic swelling, mass and lump: Secondary | ICD-10-CM | POA: Diagnosis present

## 2017-08-24 DIAGNOSIS — Z95820 Peripheral vascular angioplasty status with implants and grafts: Secondary | ICD-10-CM

## 2017-08-24 DIAGNOSIS — E875 Hyperkalemia: Secondary | ICD-10-CM | POA: Diagnosis not present

## 2017-08-24 DIAGNOSIS — I97648 Postprocedural seroma of a circulatory system organ or structure following other circulatory system procedure: Secondary | ICD-10-CM | POA: Diagnosis present

## 2017-08-24 DIAGNOSIS — M79609 Pain in unspecified limb: Secondary | ICD-10-CM | POA: Diagnosis not present

## 2017-08-24 DIAGNOSIS — Z79899 Other long term (current) drug therapy: Secondary | ICD-10-CM

## 2017-08-24 DIAGNOSIS — I361 Nonrheumatic tricuspid (valve) insufficiency: Secondary | ICD-10-CM | POA: Diagnosis not present

## 2017-08-24 DIAGNOSIS — H532 Diplopia: Secondary | ICD-10-CM | POA: Diagnosis not present

## 2017-08-24 DIAGNOSIS — Z7951 Long term (current) use of inhaled steroids: Secondary | ICD-10-CM

## 2017-08-24 DIAGNOSIS — Z8619 Personal history of other infectious and parasitic diseases: Secondary | ICD-10-CM

## 2017-08-24 DIAGNOSIS — R55 Syncope and collapse: Secondary | ICD-10-CM | POA: Diagnosis present

## 2017-08-24 DIAGNOSIS — K210 Gastro-esophageal reflux disease with esophagitis: Secondary | ICD-10-CM | POA: Diagnosis not present

## 2017-08-24 DIAGNOSIS — T508X5A Adverse effect of diagnostic agents, initial encounter: Secondary | ICD-10-CM | POA: Diagnosis present

## 2017-08-24 LAB — COMPREHENSIVE METABOLIC PANEL
ALT: 7 U/L — AB (ref 17–63)
AST: 17 U/L (ref 15–41)
Albumin: 2.9 g/dL — ABNORMAL LOW (ref 3.5–5.0)
Alkaline Phosphatase: 93 U/L (ref 38–126)
Anion gap: 11 (ref 5–15)
BUN: 54 mg/dL — ABNORMAL HIGH (ref 6–20)
CHLORIDE: 103 mmol/L (ref 101–111)
CO2: 24 mmol/L (ref 22–32)
CREATININE: 5.19 mg/dL — AB (ref 0.61–1.24)
Calcium: 7.8 mg/dL — ABNORMAL LOW (ref 8.9–10.3)
GFR, EST AFRICAN AMERICAN: 13 mL/min — AB (ref >60)
GFR, EST NON AFRICAN AMERICAN: 11 mL/min — AB (ref >60)
Glucose, Bld: 110 mg/dL — ABNORMAL HIGH (ref 65–99)
Potassium: 4.6 mmol/L (ref 3.5–5.1)
SODIUM: 138 mmol/L (ref 135–145)
Total Bilirubin: 0.4 mg/dL (ref 0.3–1.2)
Total Protein: 6.2 g/dL — ABNORMAL LOW (ref 6.5–8.1)

## 2017-08-24 LAB — CBC WITH DIFFERENTIAL/PLATELET
BASOS ABS: 0.1 10*3/uL (ref 0.0–0.1)
BASOS PCT: 1 %
BASOS PCT: 1 %
Basophils Absolute: 0 10*3/uL (ref 0.0–0.1)
EOS ABS: 0.3 10*3/uL (ref 0.0–0.7)
EOS PCT: 3 %
EOS PCT: 4 %
Eosinophils Absolute: 0.2 10*3/uL (ref 0.0–0.7)
HCT: 29.3 % — ABNORMAL LOW (ref 39.0–52.0)
HCT: 31.3 % — ABNORMAL LOW (ref 39.0–52.0)
Hemoglobin: 9.4 g/dL — ABNORMAL LOW (ref 13.0–17.0)
Hemoglobin: 10 g/dL — ABNORMAL LOW (ref 13.0–17.0)
LYMPHS PCT: 11 %
Lymphocytes Relative: 14 %
Lymphs Abs: 1 10*3/uL (ref 0.7–4.0)
Lymphs Abs: 1.2 10*3/uL (ref 0.7–4.0)
MCH: 32.1 pg (ref 26.0–34.0)
MCH: 32.5 pg (ref 26.0–34.0)
MCHC: 32.1 g/dL (ref 30.0–36.0)
MCHC: 31.9 g/dL (ref 30.0–36.0)
MCV: 100 fL (ref 78.0–100.0)
MCV: 101.6 fL — ABNORMAL HIGH (ref 78.0–100.0)
MONO ABS: 0.8 10*3/uL (ref 0.1–1.0)
MONO ABS: 0.8 10*3/uL (ref 0.1–1.0)
MONOS PCT: 9 %
Monocytes Relative: 8 %
Neutro Abs: 7.3 10*3/uL (ref 1.7–7.7)
Neutro Abs: 6.2 10*3/uL (ref 1.7–7.7)
Neutrophils Relative %: 77 %
Neutrophils Relative %: 72 %
PLATELETS: 247 10*3/uL (ref 150–400)
Platelets: 222 10*3/uL (ref 150–400)
RBC: 2.93 MIL/uL — AB (ref 4.22–5.81)
RBC: 3.08 MIL/uL — ABNORMAL LOW (ref 4.22–5.81)
RDW: 14.9 % (ref 11.5–15.5)
RDW: 15.4 % (ref 11.5–15.5)
WBC: 9.4 10*3/uL (ref 4.0–10.5)
WBC: 8.5 10*3/uL (ref 4.0–10.5)

## 2017-08-24 LAB — PROTIME-INR
INR: 1.19
PROTHROMBIN TIME: 15 s (ref 11.4–15.2)

## 2017-08-24 MED ORDER — ORAL CARE MOUTH RINSE
15.0000 mL | Freq: Two times a day (BID) | OROMUCOSAL | Status: DC
  Administered 2017-08-25 – 2017-09-04 (×11): 15 mL via OROMUCOSAL

## 2017-08-24 MED ORDER — VITAMIN D 1000 UNITS PO TABS
2000.0000 [IU] | ORAL_TABLET | Freq: Every day | ORAL | Status: DC
  Administered 2017-08-25 – 2017-09-05 (×12): 2000 [IU] via ORAL
  Filled 2017-08-24 (×14): qty 2

## 2017-08-24 MED ORDER — CARVEDILOL 25 MG PO TABS
25.0000 mg | ORAL_TABLET | Freq: Two times a day (BID) | ORAL | Status: DC
  Administered 2017-08-24 – 2017-08-28 (×8): 25 mg via ORAL
  Filled 2017-08-24 (×8): qty 1

## 2017-08-24 MED ORDER — CEFAZOLIN SODIUM-DEXTROSE 1-4 GM/50ML-% IV SOLN
1.0000 g | Freq: Once | INTRAVENOUS | Status: AC
  Administered 2017-08-24: 1 g via INTRAVENOUS
  Filled 2017-08-24: qty 50

## 2017-08-24 MED ORDER — OXYCODONE HCL 5 MG PO TABS
5.0000 mg | ORAL_TABLET | ORAL | Status: DC | PRN
  Administered 2017-08-25 – 2017-08-30 (×17): 10 mg via ORAL
  Administered 2017-08-30: 5 mg via ORAL
  Administered 2017-08-31 – 2017-09-02 (×13): 10 mg via ORAL
  Administered 2017-09-03: 5 mg via ORAL
  Administered 2017-09-05: 10 mg via ORAL
  Filled 2017-08-24 (×11): qty 2
  Filled 2017-08-24: qty 1
  Filled 2017-08-24 (×22): qty 2

## 2017-08-24 MED ORDER — HEPARIN SODIUM (PORCINE) 5000 UNIT/ML IJ SOLN: 5000.0000 [IU] | Freq: Three times a day (TID) | INTRAMUSCULAR | Status: DC

## 2017-08-24 MED ORDER — ONDANSETRON HCL 4 MG PO TABS: 4.0000 mg | ORAL_TABLET | Freq: Four times a day (QID) | ORAL | Status: DC | PRN

## 2017-08-24 MED ORDER — FLUTICASONE FUROATE-VILANTEROL 200-25 MCG/INH IN AEPB
1.0000 | INHALATION_SPRAY | Freq: Every day | RESPIRATORY_TRACT | Status: DC
  Administered 2017-08-25 – 2017-09-04 (×10): 1 via RESPIRATORY_TRACT
  Filled 2017-08-24: qty 28

## 2017-08-24 MED ORDER — ONDANSETRON HCL 4 MG/2ML IJ SOLN: 4.0000 mg | Freq: Four times a day (QID) | INTRAMUSCULAR | Status: DC | PRN

## 2017-08-24 MED ORDER — CEFAZOLIN SODIUM-DEXTROSE 1-4 GM/50ML-% IV SOLN
1.0000 g | Freq: Two times a day (BID) | INTRAVENOUS | Status: DC
  Administered 2017-08-25 (×2): 1 g via INTRAVENOUS
  Filled 2017-08-24 (×3): qty 50

## 2017-08-24 MED ORDER — PANTOPRAZOLE SODIUM 40 MG PO TBEC
40.0000 mg | DELAYED_RELEASE_TABLET | Freq: Two times a day (BID) | ORAL | Status: DC
  Administered 2017-08-24 – 2017-09-05 (×23): 40 mg via ORAL
  Filled 2017-08-24 (×24): qty 1

## 2017-08-24 MED ORDER — ONDANSETRON HCL 4 MG/2ML IJ SOLN
4.0000 mg | Freq: Once | INTRAMUSCULAR | Status: AC
  Administered 2017-08-24: 4 mg via INTRAVENOUS
  Filled 2017-08-24: qty 2

## 2017-08-24 MED ORDER — MORPHINE SULFATE (PF) 4 MG/ML IV SOLN
6.0000 mg | Freq: Once | INTRAVENOUS | Status: AC
  Administered 2017-08-24: 6 mg via INTRAVENOUS
  Filled 2017-08-24: qty 2

## 2017-08-24 MED ORDER — MORPHINE SULFATE (PF) 2 MG/ML IV SOLN
2.0000 mg | INTRAVENOUS | Status: DC | PRN
  Administered 2017-08-24 – 2017-08-25 (×3): 2 mg via INTRAVENOUS
  Filled 2017-08-24 (×3): qty 1

## 2017-08-24 MED ORDER — CLOPIDOGREL BISULFATE 75 MG PO TABS
75.0000 mg | ORAL_TABLET | Freq: Every day | ORAL | Status: DC
  Administered 2017-08-25 – 2017-09-05 (×12): 75 mg via ORAL
  Filled 2017-08-24 (×13): qty 1

## 2017-08-24 MED ORDER — ALBUTEROL SULFATE (2.5 MG/3ML) 0.083% IN NEBU
2.5000 mg | INHALATION_SOLUTION | Freq: Once | RESPIRATORY_TRACT | Status: AC
Start: 2017-08-24 — End: 2017-08-24
  Administered 2017-08-24: 2.5 mg via RESPIRATORY_TRACT
  Filled 2017-08-24: qty 3

## 2017-08-24 MED ORDER — HYDROMORPHONE HCL 1 MG/ML IJ SOLN
1.0000 mg | Freq: Once | INTRAMUSCULAR | Status: AC
  Administered 2017-08-24: 1 mg via INTRAVENOUS
  Filled 2017-08-24: qty 1

## 2017-08-24 MED ORDER — SODIUM CHLORIDE 0.9% FLUSH
3.0000 mL | Freq: Two times a day (BID) | INTRAVENOUS | Status: DC
  Administered 2017-08-24 – 2017-08-25 (×3): 3 mL via INTRAVENOUS

## 2017-08-24 MED ORDER — OXYCODONE HCL 5 MG PO TABS
5.0000 mg | ORAL_TABLET | ORAL | Status: DC | PRN
  Administered 2017-08-24: 5 mg via ORAL
  Filled 2017-08-24: qty 1

## 2017-08-24 MED ORDER — HYDROMORPHONE HCL 1 MG/ML IJ SOLN: 1.0000 mg | INTRAMUSCULAR | Status: DC | PRN

## 2017-08-24 MED ORDER — DOXAZOSIN MESYLATE 2 MG PO TABS
8.0000 mg | ORAL_TABLET | Freq: Every day | ORAL | Status: DC
  Administered 2017-08-24 – 2017-08-27 (×4): 8 mg via ORAL
  Filled 2017-08-24 (×4): qty 4

## 2017-08-24 MED ORDER — TORSEMIDE 20 MG PO TABS
20.0000 mg | ORAL_TABLET | Freq: Every day | ORAL | Status: DC
  Administered 2017-08-25 – 2017-08-26 (×2): 20 mg via ORAL
  Filled 2017-08-24 (×2): qty 1

## 2017-08-24 MED ORDER — HEPARIN SODIUM (PORCINE) 5000 UNIT/ML IJ SOLN
5000.0000 [IU] | Freq: Three times a day (TID) | INTRAMUSCULAR | Status: DC
  Administered 2017-08-24 – 2017-09-05 (×29): 5000 [IU] via SUBCUTANEOUS
  Filled 2017-08-24 (×31): qty 1

## 2017-08-24 NOTE — H&P (Signed)
History and Physical    Aaron [name] PJK:[id number] DOB: 10-15-1955 DOA: 08/24/2017  PCP: Gwenlyn Saran [place]   Patient coming from: Home.  I have personally briefly reviewed patient's old medical records in [place]  Chief Complaint: Groin swelling.  HPI: Aaron [name] is a 62 y.o. male with medical history significant of anxiety, osteoarthritis of knees, back and shoulders, CAD (mild nonobstructive disease in 2002), chronic back pain, stage II CKD, COPD, polycystic kidney disease, diverticulosis, GERD, hypertension, history of treated hep C,  history of urolithiasis, hyperlipidemia, paroxysmal atrial tachycardia, history of syncopal episodes, peripheral arterial disease who is 10 days post-op from abdominal aortogram with lower extremity angiography, which was uneventful until this morning when he woke up with right groin pain and noticed right groin area and genitalia edema.    The patient also stated that he syncopized last night falling on his right side injuring his right forearm, but he did not feel any aggravation of the surgical site last evening until this morning.  He denies fever, chills, sore throat, but complains of increased productive cough since quitting smoking, frequent wheezing, no hemoptysis.  He denies chest pain, palpitations, diaphoresis, PND, orthopnea or pitting edema of the lower extremities.  However he complains of frequent postural dizziness.  No abdominal pain, nausea, emesis, diarrhea, constipation, melena or hematochezia.  No dysuria or frequency, however the patient complains of significant edema of the right groin and genitalia.  No heat or cold intolerance.  No polyuria, polydipsia, polyphagia.  The patient drove himself to the ED this afternoon.  On the way to the hospital, the patient stated he was having diplopia and had to close one eye.  This lasted for 30 to 45 minutes.  When Dr. Winfred Leeds was evaluating him, he told him  that he was still having double vision and was seeing two identical physicians.  He complains of occasional headache, but he has frequent sinusitis.  He denies focal weakness or numbness.  No slurred speech, language comprehension difficulty or expressive aphasia reported by the patient.  ED Course: Initial vital signs temperature 37.2 C (99 F), pulse 83, respirations 16, blood pressure 201/97 mmHg and O2 sat 98% on room air.  The patient was given 1 g of cefazolin, 6 mg of morphine IVP with partial relief.  I added hydromorphone 1 mg IVP plus Zofran 4 mg IVP.  His work-up shows an EKG with APCs and all anterior infarct.  White count of 9.4 with 77% neutrophils, 11% lymphocytes and 8% monocytes on the differential.  Hemoglobin 9.4 g/dL, last postop hemoglobin level was 9.7 g/dL on 08/15/2017.  PT and INR are normal.  Sodium 138, potassium 4.6, chloride 103 and CO2 24 mmol/L.  BUN was 54, creatinine 5.19, glucose 110 calcium 7.8 mg/dL (hypoalbuminemia corrected calcium is 8.7 mg/dL).  Total protein 6.2 and albumin 2.9 g/dL.  AST 17, ALT 7 and alkaline phosphatase 93 U/L.  Bilirubin was 0.4 mg/dL.  Imaging: CT scan of the head did not show any acute abnormality.  However demonstrated chronic sinusitis changes.  Please see images and full radiology report for further detail.  Review of Systems: As per HPI otherwise 10 point review of systems negative.   Past Medical History:  Diagnosis Date  . Anxiety   . Arthritis    knees , back , shoulders  . CAD (coronary artery disease)    Mild nonobstructive disease at cardiac catheterization 2002  . Chronic back pain   .  CKD (chronic kidney disease), stage II   . COPD (chronic obstructive pulmonary disease) ([place])   . Diverticulitis   . Esophageal reflux   . Essential hypertension   . Hepatitis C    states he no longer has this  . History of kidney stones   . History of syncope   . Hyperlipidemia   . Nephrolithiasis   . PAT (paroxysmal atrial  tachycardia) ([place])   . Peripheral arterial disease (HCC)    Occluded left superficial femoral artery status post stent June 2016 - Dr. Trula Slade  . Pneumonia    as child   5 years old  . Polycystic kidney, unspecified type   . Syncope 09/2014    Past Surgical History:  Procedure Laterality Date  . ABDOMINAL AORTOGRAM N/A 08/11/2017   Procedure: ABDOMINAL AORTOGRAM;  Surgeon: Serafina Mitchell, MD;  Location: [facility];  Service: Cardiovascular;  Laterality: N/A;  . APPLICATION OF WOUND VAC Right 08/14/2017   Procedure: APPLICATION OF PREVENA INCISIONAL WOUND VAC RIGHT GROIN;  Surgeon: Serafina Mitchell, MD;  Location: [facility];  Service: Vascular;  Laterality: Right;  . BACK SURGERY    . BIOPSY  12/17/2016   Procedure: BIOPSY;  Surgeon: Daneil Dolin, MD;  [facility];  Service: Gastroenterology;;  gastric colon  . BIOPSY  04/29/2017   Procedure: BIOPSY;  Surgeon: Daneil Dolin, MD;  [facility];  Service: Endoscopy;;  duodenal biopsies  . BUNIONECTOMY    . CERVICAL SPINE SURGERY    . COLONOSCOPY  2008   Dr. Oneida Alar: rare sigmoid colon diverticulosis, internal hemorrhoids.   . COLONOSCOPY WITH PROPOFOL N/A 12/17/2016   dense left-sided diverticulosis, right colon ulcers s/p biopsy query occult NSAID use vs transient ischemia, not consistent with IBD. CMV stains negative.   Marland Kitchen ENDARTERECTOMY FEMORAL Right 08/14/2017   Procedure: RIGHT ILLIO-FEMORAL ENDARTERECTOMY;  Surgeon: Serafina Mitchell, MD;  Location: [facility];  Service: Vascular;  Laterality: Right;  . ESOPHAGEAL DILATION  12/17/2016   EGD with mild Schatzki's ring s/p dilatation, small hiatal hernia, erosive gastropathy (negative H.pylori gastritis)  . ESOPHAGOGASTRODUODENOSCOPY  2008   Dr. Oneida Alar: normal esophagus without Barrett's, antritis and duodenitis, path with H.pylori gastritis  . ESOPHAGOGASTRODUODENOSCOPY (EGD) WITH PROPOFOL N/A 12/17/2016   Procedure: ESOPHAGOGASTRODUODENOSCOPY (EGD) WITH PROPOFOL;   Surgeon: Daneil Dolin, MD;  [facility];  Service: Gastroenterology;  Laterality: N/A;  . ESOPHAGOGASTRODUODENOSCOPY (EGD) WITH PROPOFOL N/A 04/29/2017   Patchy erythema of gastric mucosa diffusely, extensive inflammatory changes in duodenum, geographic ulceration and mucosal edema present, encroaching somewhat on the lumen yet still widely patent, distal second portion of duodenum appeared abnormal, path with peptic duodenitis with ulceration  . HERNIA REPAIR     umbilical  . INSERTION OF ILIAC STENT Right 08/14/2017   Procedure: INSERTION OF RIGHT COMMON ILIAC STENT 56mm x 3mm x 130cm INSERTION OF RIGHT EXTERNAL ILIAC STENT 66mm x 62mm x 130cm INSERTION OF SUPERFICIAL FERMORAL ARTERY STENT 1mm x 102mm x 130cm;  Surgeon: Serafina Mitchell, MD;  Location: [facility];  Service: Vascular;  Laterality: Right;  . laparoscopic inguinal hernia right  02/2017   [place]  . LOWER EXTREMITY ANGIOGRAPHY Right 08/11/2017   Procedure: LOWER EXTREMITY ANGIOGRAPHY;  Surgeon: Serafina Mitchell, MD;  Location: [facility];  Service: Cardiovascular;  Laterality: Right;  . PATCH ANGIOPLASTY Right 08/14/2017   Procedure: PATCH ANGIOPLASTY USING HEMASHIELD PATCH 0.3IN Lillie Columbia;  Surgeon: Serafina Mitchell, MD;  Location:  [facility];  Service: Vascular;  Laterality: Right;  . PERIPHERAL VASCULAR CATHETERIZATION N/A 09/20/2014   Procedure: Abdominal Aortogram;  Surgeon: Serafina Mitchell, MD;  Location: [facility];  Service: Cardiovascular;  Laterality: N/A;     reports that he quit smoking about 3 weeks ago. His smoking use included cigarettes. He has a 23.50 pack-year smoking history. He has quit using smokeless tobacco. His smokeless tobacco use included snuff and chew. He reports that he has current or past drug history. He reports that he does not drink alcohol.  No Known Allergies  Family History  Problem Relation Age of Onset  . Alcoholism Mother   . Heart disease Father        Massive heart attack  .  Heart attack Father   . Atrial fibrillation Father   . Colon cancer Father   . Colon cancer Maternal Grandfather 44  . Alcoholism Maternal Grandfather   . Renal cancer Cousin   . Ovarian cancer Sister     Prior to Admission medications   Medication Sig Start Date End Date Taking? Authorizing Provider  BREO ELLIPTA 200-25 MCG/INH AEPB Inhale 1 puff into the lungs daily. 07/27/17  Yes [provider]  carvedilol (COREG) 25 MG tablet Take 1 tablet (25 mg total) 2 (two) times daily by mouth. 02/25/17 08/24/17 Yes Imogene Burn, PA-C  cholecalciferol (VITAMIN D) 1000 units tablet Take 2,000 Units by mouth daily.   Yes [provider]  clopidogrel (PLAVIX) 75 MG tablet Take 1 tablet (75 mg total) by mouth daily. 08/16/17  Yes Ulyses Amor, PA-C  doxazosin (CARDURA) 8 MG tablet Take 8 mg by mouth at bedtime.    Yes [provider]  oxyCODONE-acetaminophen (PERCOCET/ROXICET) 5-325 MG tablet Take 1 tablet by mouth every 6 (six) hours as needed. Patient taking differently: Take 1 tablet by mouth every 3 (three) hours as needed for moderate pain.  08/16/17  Yes Ulyses Amor, PA-C  torsemide (DEMADEX) 20 MG tablet Take 1 tablet (20 mg total) by mouth daily. 12/26/16  Yes Reyne Dumas, MD  pantoprazole (PROTONIX) 40 MG tablet Take 1 tablet (40 mg total) by mouth 2 (two) times daily. 04/30/17 08/14/17  Heath Lark D, DO    Physical Exam: Vitals:   08/24/17 1926 08/24/17 1928 08/24/17 1930 08/24/17 2118  BP: (!) 190/99 (!) 190/99 (!) 187/91 (!) 161/95  Pulse: 73 74 78 76  Resp: 17 17 18 16   Temp:    98.9 F (37.2 C)  TempSrc:      SpO2: 98% 100% 100% 100%  Weight:      Height:        Constitutional: NAD, calm, comfortable Eyes: PERRL, lids and conjunctivae normal ENMT: Mucous membranes are moist. Posterior pharynx clear of any exudate or lesions. Neck: normal, supple, no masses, no thyromegaly Respiratory: Decreased breath sounds with wheezing and mild rhonchi  bilaterally, no crackles. Normal respiratory effort. No accessory muscle use.  Cardiovascular: Regular rate and rhythm, no murmurs / rubs / gallops. No extremity edema. 2+ pedal pulses. No carotid bruits.  Abdomen: Soft, no tenderness, no masses palpated. No hepatosplenomegaly. Bowel sounds positive.  Genitalia: Significant edema of groin, penis and scrotum.  See picture below. Musculoskeletal: There is ecchymosis on the right forearm.  No clubbing / cyanosis. Good ROM, no contractures. Normal muscle tone.  Skin: Positive erythema and edema of right groin and genitalia.  Please see picture below. Neurologic: CN 2-12 grossly intact. Sensation intact, DTR normal. Strength  5/5 in all 4.  Psychiatric: Normal judgment and insight. Alert and oriented x 4. Normal mood.       Labs on Admission: I have personally reviewed following labs and imaging studies  CBC: Recent Labs  Lab 08/24/17 1808  WBC 9.4  NEUTROABS 7.3  HGB 9.4*  HCT 29.3*  MCV 100.0  PLT 194   Basic Metabolic Panel: Recent Labs  Lab 08/24/17 1808  NA 138  K 4.6  CL 103  CO2 24  GLUCOSE 110*  BUN 54*  CREATININE 5.19*  CALCIUM 7.8*   GFR: Estimated Creatinine Clearance: 18.4 mL/min (A) (by C-G formula based on SCr of 5.19 mg/dL (H)). Liver Function Tests: Recent Labs  Lab 08/24/17 1808  AST 17  ALT 7*  ALKPHOS 93  BILITOT 0.4  PROT 6.2*  ALBUMIN 2.9*   No results for input(s): LIPASE, AMYLASE in the last 168 hours. No results for input(s): AMMONIA in the last 168 hours. Coagulation Profile: Recent Labs  Lab 08/24/17 1808  INR 1.19   Cardiac Enzymes: No results for input(s): CKTOTAL, CKMB, CKMBINDEX, TROPONINI in the last 168 hours. BNP (last 3 results) No results for input(s): PROBNP in the last 8760 hours. HbA1C: No results for input(s): HGBA1C in the last 72 hours. CBG: No results for input(s): GLUCAP in the last 168 hours. Lipid Profile: No results for input(s): CHOL, HDL, LDLCALC, TRIG,  CHOLHDL, LDLDIRECT in the last 72 hours. Thyroid Function Tests: No results for input(s): TSH, T4TOTAL, FREET4, T3FREE, THYROIDAB in the last 72 hours. Anemia Panel: No results for input(s): VITAMINB12, FOLATE, FERRITIN, TIBC, IRON, RETICCTPCT in the last 72 hours. Urine analysis:    Component Value Date/Time   COLORURINE STRAW (A) 08/14/2017 0846   APPEARANCEUR CLEAR 08/14/2017 0846   LABSPEC 1.006 08/14/2017 0846   PHURINE 5.0 08/14/2017 0846   GLUCOSEU NEGATIVE 08/14/2017 0846   HGBUR SMALL (A) 08/14/2017 0846   BILIRUBINUR NEGATIVE 08/14/2017 0846   KETONESUR NEGATIVE 08/14/2017 0846   PROTEINUR NEGATIVE 08/14/2017 0846   UROBILINOGEN 0.2 03/03/2010 1056   NITRITE NEGATIVE 08/14/2017 0846   LEUKOCYTESUR NEGATIVE 08/14/2017 0846    Radiological Exams on Admission: Ct Head Wo Contrast  Result Date: 08/24/2017 CLINICAL DATA:  Fall last night, bilateral diplopia starting today. EXAM: CT HEAD WITHOUT CONTRAST TECHNIQUE: Contiguous axial images were obtained from the base of the skull through the vertex without intravenous contrast. COMPARISON:  Head CT dated 09/22/2014. FINDINGS: Brain: Mild generalized age related parenchymal atrophy with commensurate dilatation of the ventricles and sulci. Ventricles are stable in size and configuration. Again noted is a prominent CSF space adjacent to the RIGHT temporal lobe, compatible with chronic arachnoid cyst. There is no parenchymal mass, hemorrhage or edema appreciated. No extra-axial hemorrhage. Vascular: There are chronic calcified atherosclerotic changes of the large vessels at the skull base. No unexpected hyperdense vessel. Skull: Normal. Negative for fracture or focal lesion. Sinuses/Orbits: Chronic RIGHT maxillary sinus disease. No acute findings. Other: None. IMPRESSION: 1. No acute findings. No intracranial hemorrhage or edema. No skull fracture. 2. Chronic RIGHT maxillary sinus disease. Electronically Signed   By: Franki Cabot M.D.   On:  08/24/2017 21:05    EKG: Independently reviewed. Vent. rate 83 BPM PR interval * ms QRS duration 87 ms QT/QTc 395/465 ms P-R-T axes 48 22 42 Sinus rhythm Atrial premature complex Anteroseptal infarct, old When compared to previous short PR interval and minimal ST depressions are no longer present.  Assessment/Plan Principal Problem:   Groin swelling  PAD (peripheral artery disease) (HCC) Admit to [facility]/Inpatient. The case was discussed with Dr. Sherren Mocha. Most likely will evaluate in the morning. No anticoagulation recommended. Continue cefazolin per pharmacy. Oxycodone 5 mg every 3 hours as needed for pain. Check Doppler ultrasound in the morning to rule out DVT.  Active Problems:   SYNCOPE Multiple episodes in the past. An outpatient non-telemetry monitoring hookup from 10/10 to 01/27/2017 was unremarkable. CT scan of head done today was negative.  I will check echocardiogram and carotid Doppler in a.m.    Essential hypertension Continue carvedilol 25 mg p.o. twice daily. Continue doxazosin 8 mg p.o. at bedtime. Monitor blood pressure and heart rate.    Esophageal reflux Protonix 20 mg p.o. daily.    COPD (chronic obstructive pulmonary disease) (HCC) Continue albuterol neb treatments. No ipratropium due to increased secretions after recent smoking cessation. Guaifenesin 600 mg p.o. twice daily. The patient was encouraged to keep away from tobacco.    Paroxysmal atrial fibrillation (HCC) CHA?DS?-VASc Score of at least 3. Sinus rhythm now. Off warfarin since surgery. Continue carvedilol 25 mg p.o. twice daily.    Hyperlipidemia Not on medical therapy. Should he discuss with his PCP given his vascular disease history.    DVT prophylaxis: Heparin SQ. Code Status: Full code. Family Communication: His daughter was present in the ED room. Disposition Plan: Admit to [facility] for further evaluation and treatment. Consults called: Vascular Surgery (Spoke with Dr. Sherren Mocha  Early). Admission status: Inpatient/telemetry.   Reubin Milan MD [facility] Pager [phone].  If 7PM-7AM, please contact night-coverage [email or web address] Password [facility]  08/24/2017, 9:27 PM

## 2017-08-24 NOTE — ED Triage Notes (Signed)
Patient states he had surgery over a week ago and was cut in his groin area. Complaining of swelling to testicles, penis, and right leg since last night.

## 2017-08-24 NOTE — ED Provider Notes (Signed)
[facility] EMERGENCY DEPARTMENT Provider Note   CSN: [id number] Arrival date & time: 08/24/17  1639     History   Chief Complaint Chief Complaint  Patient presents with  . Groin Swelling    HPI Aaron [name] is a 62 y.o. male.  HPI Complains of right leg pain and swelling and swelling of penis and scotum first noticed upon awakening this morning.  He denies chest pain or shortness of breath.  He does report that he has syncopal event last night..  Nothing makes symptoms better or worse.  He denies dysuria.  She underwent right iliofemoral endarterectomy and insertion of right common iliac stent on 08/14/2017 he reports he was doing well until yesterday.  Other associated symptoms include intermittent diplopia since yesterday denies any headache denies focal numbness or weakness Past Medical History:  Diagnosis Date  . Anxiety   . Arthritis    knees , back , shoulders  . CAD (coronary artery disease)    Mild nonobstructive disease at cardiac catheterization 2002  . Chronic back pain   . CKD (chronic kidney disease), stage II   . COPD (chronic obstructive pulmonary disease) ([place])   . Diverticulitis   . Esophageal reflux   . Essential hypertension   . Hepatitis C    states he no longer has this  . History of kidney stones   . History of syncope   . Hyperlipidemia   . Nephrolithiasis   . PAT (paroxysmal atrial tachycardia) ([place])   . Peripheral arterial disease (HCC)    Occluded left superficial femoral artery status post stent June 2016 - Dr. Trula Slade  . Pneumonia    as child   72 years old  . Polycystic kidney, unspecified type   . Syncope 09/2014    Patient Active Problem List   Diagnosis Date Noted  . Duodenitis 04/27/2017  . Schatzki's ring of distal esophagus 04/27/2017  . Encounter for therapeutic drug monitoring 03/03/2017  . Abdominal pain 02/20/2017  . History of hepatitis C 02/20/2017  . Ischemic cardiomyopathy 01/27/2017  . Dyslipidemia 01/27/2017  .  Paroxysmal atrial fibrillation ([place]) 01/27/2017  . Preoperative clearance 01/21/2017  . CAD (coronary artery disease) 01/21/2017  . Pain of upper abdomen   . Acute renal failure (ARF) ([place]) 12/16/2016  . Rectal bleeding   . AKI (acute kidney injury) ([place]) 12/15/2016  . CKD (chronic kidney disease), stage V ([place]) 12/15/2016  . Hepatitis C 12/15/2016  . Polycystic kidney disease 12/14/2016  . Jerking 09/23/2014  . Syncope 09/23/2014  . COPD (chronic obstructive pulmonary disease) ([place])   . COLD (chronic obstructive lung disease) ([place])   . Depression   . PAD (peripheral artery disease) ([place]) 09/19/2014  . Preop cardiovascular exam 10/28/2010  . Tobacco abuse 10/28/2010  . SYNCOPE 05/07/2010  . UNSPECIFIED IRON DEFICIENCY ANEMIA 10/04/2009  . 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  . Essential hypertension 10/04/2009  . Esophageal reflux 10/04/2009  . PRECORDIAL PAIN 10/04/2009    Past Surgical History:  Procedure Laterality Date  . ABDOMINAL AORTOGRAM N/A 08/11/2017   Procedure: ABDOMINAL AORTOGRAM;  Surgeon: Serafina Mitchell, MD;  Location: [place] [facility];  Service: Cardiovascular;  Laterality: N/A;  . APPLICATION OF WOUND VAC Right 08/14/2017   Procedure: APPLICATION OF PREVENA INCISIONAL WOUND VAC RIGHT GROIN;  Surgeon: Serafina Mitchell, MD;  Location: [place];  Service: Vascular;  Laterality: Right;  . BACK SURGERY    . BIOPSY  12/17/2016   Procedure: BIOPSY;  Surgeon: Daneil Dolin, MD;  [facility];  Service: Gastroenterology;;  gastric colon  . BIOPSY  04/29/2017   Procedure: BIOPSY;  Surgeon: Daneil Dolin, MD;  [facility];  Service: Endoscopy;;  duodenal biopsies  . BUNIONECTOMY    . CERVICAL SPINE SURGERY    . COLONOSCOPY  2008   Dr. Oneida Alar: rare sigmoid colon diverticulosis, internal hemorrhoids.   . COLONOSCOPY WITH PROPOFOL N/A 12/17/2016   dense left-sided diverticulosis, right colon ulcers s/p biopsy query occult NSAID use vs transient ischemia,  not consistent with IBD. CMV stains negative.   Marland Kitchen ENDARTERECTOMY FEMORAL Right 08/14/2017   Procedure: RIGHT ILLIO-FEMORAL ENDARTERECTOMY;  Surgeon: Serafina Mitchell, MD;  Location[facility];  Service: Vascular;  Laterality: Right;  . ESOPHAGEAL DILATION  12/17/2016   EGD with mild Schatzki's ring s/p dilatation, small hiatal hernia, erosive gastropathy (negative H.pylori gastritis)  . ESOPHAGOGASTRODUODENOSCOPY  2008   Dr. Oneida Alar: normal esophagus without Barrett's, antritis and duodenitis, path with H.pylori gastritis  . ESOPHAGOGASTRODUODENOSCOPY (EGD) WITH PROPOFOL N/A 12/17/2016   Procedure: ESOPHAGOGASTRODUODENOSCOPY (EGD) WITH PROPOFOL;  Surgeon: Daneil Dolin, MD;  [facility];  Service: Gastroenterology;  Laterality: N/A;  . ESOPHAGOGASTRODUODENOSCOPY (EGD) WITH PROPOFOL N/A 04/29/2017   Patchy erythema of gastric mucosa diffusely, extensive inflammatory changes in duodenum, geographic ulceration and mucosal edema present, encroaching somewhat on the lumen yet still widely patent, distal second portion of duodenum appeared abnormal, path with peptic duodenitis with ulceration  . HERNIA REPAIR     umbilical  . INSERTION OF ILIAC STENT Right 08/14/2017   Procedure: INSERTION OF RIGHT COMMON ILIAC STENT 59mm x 34mm x 130cm INSERTION OF RIGHT EXTERNAL ILIAC STENT 52mm x 43mm x 130cm INSERTION OF SUPERFICIAL FERMORAL ARTERY STENT 16mm x 60mm x 130cm;  Surgeon: Serafina Mitchell, MD;  Location: [facility] OR;  Service: Vascular;  Laterality: Right;  . laparoscopic inguinal hernia right  02/2017   [place]  . LOWER EXTREMITY ANGIOGRAPHY Right 08/11/2017   Procedure: LOWER EXTREMITY ANGIOGRAPHY;  Surgeon: Serafina Mitchell, MD;  Location: [place] [facility];  Service: Cardiovascular;  Laterality: Right;  . PATCH ANGIOPLASTY Right 08/14/2017   Procedure: PATCH ANGIOPLASTY USING HEMASHIELD PATCH 0.3IN Lillie [place];  Surgeon: Serafina Mitchell, MD;  Location: [facility] OR;  Service: Vascular;  Laterality: Right;  . PERIPHERAL  VASCULAR CATHETERIZATION N/A 09/20/2014   Procedure: Abdominal Aortogram;  Surgeon: Serafina Mitchell, MD;  Location: [place] [facility];  Service: Cardiovascular;  Laterality: N/A;        Home Medications    Prior to Admission medications   Medication Sig Start Date End Date Taking? Authorizing Provider  BREO ELLIPTA 200-25 MCG/INH AEPB Inhale 1 puff into the lungs daily. 07/27/17   [provider]  carvedilol (COREG) 25 MG tablet Take 1 tablet (25 mg total) 2 (two) times daily by mouth. 02/25/17 08/14/17  Imogene Burn, PA-C  cholecalciferol (VITAMIN D) 1000 units tablet Take 2,000 Units by mouth daily.    [provider]  clopidogrel (PLAVIX) 75 MG tablet Take 1 tablet (75 mg total) by mouth daily. 08/16/17   Ulyses Amor, PA-C  clopidogrel (PLAVIX) 75 MG tablet Take 1 tablet (75 mg total) by mouth daily. 08/16/17   Ulyses Amor, PA-C  doxazosin (CARDURA) 8 MG tablet Take 8 mg by mouth at bedtime.     [provider]  hydrOXYzine (ATARAX/VISTARIL) 25 MG tablet Take 25 mgs by mouth once daily 11/28/16   [provider]  ondansetron [facility])  4 MG tablet Take 1 tablet (4 mg total) by mouth every 6 (six) hours as needed for nausea. 04/30/17   Manuella Ghazi, Pratik D, DO  oxyCODONE-acetaminophen (PERCOCET/ROXICET) 5-325 MG tablet Take 1 tablet by mouth every 6 (six) hours as needed. 08/16/17   Ulyses Amor, PA-C  pantoprazole (PROTONIX) 40 MG tablet Take 1 tablet (40 mg total) by mouth 2 (two) times daily. 04/30/17 08/14/17  Manuella Ghazi, Pratik D, DO  torsemide (DEMADEX) 20 MG tablet Take 1 tablet (20 mg total) by mouth daily. 12/26/16   Reyne Dumas, MD  warfarin (COUMADIN) 5 MG tablet TAKE ONE TABLET BY MOUTH EVERY DAY EXCEPT TAKE ONE-HALF TABLET ON SUNDAYS & THURSDAYS 08/18/17   Minus Breeding, MD    Family History Family History  Problem Relation Age of Onset  . Alcoholism Mother   . Heart disease Father        Massive heart attack  . Heart attack Father   . Atrial  fibrillation Father   . Colon cancer Father   . Colon cancer Maternal Grandfather 9  . Alcoholism Maternal Grandfather   . Renal cancer Cousin   . Ovarian cancer Sister     Social History Social History   Tobacco Use  . Smoking status: Former Smoker    Packs/day: 0.50    Years: 47.00    Pack years: 23.50    Types: Cigarettes    Last attempt to quit: 08/03/2017    Years since quitting: 0.0  . Smokeless tobacco: Former Systems developer    Types: Snuff, Chew  Substance Use Topics  . Alcohol use: No    Alcohol/week: 0.0 oz    Comment: alcohol free 2017,  heavy drinker in the past  . Drug use: Not Currently    Comment: states in remote past he used "whatever was around"     Allergies   Patient has no known allergies.   Review of Systems Review of Systems  Eyes: Positive for visual disturbance.  Cardiovascular: Positive for leg swelling.       Swelling of right leg only syncope last night  Genitourinary: Positive for penile pain, penile swelling and scrotal swelling. Negative for difficulty urinating.  All other systems reviewed and are negative.    Physical Exam Updated Vital Signs BP (!) 201/97 (BP Location: Right Arm)   Pulse 93   Temp 99 F (37.2 C) (Oral)   Resp 16   Ht 6\' 4"  (1.93 m)   Wt 89.8 kg (198 lb)   SpO2 98%   BMI 24.10 kg/m   Physical Exam  Constitutional: He is oriented to person, place, and time. No distress.  Chronically ill-appearing  HENT:  Head: Normocephalic and atraumatic.  No facial asymmetry  Eyes: Pupils are equal, round, and reactive to light. Conjunctivae are normal.  Neck: Neck supple. No tracheal deviation present. No thyromegaly present.  Cardiovascular: Normal rate and regular rhythm.  No murmur heard. Pulmonary/Chest: Effort normal and breath sounds normal.  Abdominal: Soft. Bowel sounds are normal. He exhibits no distension. There is no tenderness.  Genitourinary:  Genitourinary Comments: Penis and scrotum edematous.    Musculoskeletal: Normal range of motion. He exhibits no edema or tenderness.  2+ pretibial pitting edema of right leg.  Well-healing sutured wound at right groin.  Femoral and DP pulses 2+.  Bilaterally  Neurological: He is alert and oriented to person, place, and time. No cranial nerve deficit. Coordination normal.  Cranial nerves II through XII grossly intact moves all extremities  Skin: Skin  is warm and dry. No rash noted.  Mildly reddened at medial thigh right side otherwise without rash  Psychiatric: He has a normal mood and affect.  Nursing note and vitals reviewed.    ED Treatments / Results  Labs (all labs ordered are listed, but only abnormal results are displayed) Labs Reviewed  CBC WITH DIFFERENTIAL/PLATELET  COMPREHENSIVE METABOLIC PANEL  PROTIME-INR    EKG None  EKG Interpretation  Date/Time:  Monday Aug 24 2017 18:08:40 EDT Ventricular Rate:  83 PR Interval:    QRS Duration: 87 QT Interval:  395 QTC Calculation: 465 R Axis:   22 Text Interpretation:  Sinus rhythm Atrial premature complex Anteroseptal infarct, old No significant change since last tracing Confirmed by Orlie Dakin [phone]) on 08/24/2017 6:26:52 PM      Results for orders placed or performed during the hospital encounter of 08/24/17  CBC with Differential/Platelet  Result Value Ref Range   WBC 9.4 4.0 - 10.5 K/uL   RBC 2.93 (L) 4.22 - 5.81 MIL/uL   Hemoglobin 9.4 (L) 13.0 - 17.0 g/dL   HCT 29.3 (L) 39.0 - 52.0 %   MCV 100.0 78.0 - 100.0 fL   MCH 32.1 26.0 - 34.0 pg   MCHC 32.1 30.0 - 36.0 g/dL   RDW 14.9 11.5 - 15.5 %   Platelets 222 150 - 400 K/uL   Neutrophils Relative % 77 %   Neutro Abs 7.3 1.7 - 7.7 K/uL   Lymphocytes Relative 11 %   Lymphs Abs 1.0 0.7 - 4.0 K/uL   Monocytes Relative 8 %   Monocytes Absolute 0.8 0.1 - 1.0 K/uL   Eosinophils Relative 3 %   Eosinophils Absolute 0.2 0.0 - 0.7 K/uL   Basophils Relative 1 %   Basophils Absolute 0.1 0.0 - 0.1 K/uL  Comprehensive  metabolic panel  Result Value Ref Range   Sodium 138 135 - 145 mmol/L   Potassium 4.6 3.5 - 5.1 mmol/L   Chloride 103 101 - 111 mmol/L   CO2 24 22 - 32 mmol/L   Glucose, Bld 110 (H) 65 - 99 mg/dL   BUN 54 (H) 6 - 20 mg/dL   Creatinine, Ser 5.19 (H) 0.61 - 1.24 mg/dL   Calcium 7.8 (L) 8.9 - 10.3 mg/dL   Total Protein 6.2 (L) 6.5 - 8.1 g/dL   Albumin 2.9 (L) 3.5 - 5.0 g/dL   AST 17 15 - 41 U/L   ALT 7 (L) 17 - 63 U/L   Alkaline Phosphatase 93 38 - 126 U/L   Total Bilirubin 0.4 0.3 - 1.2 mg/dL   GFR calc non Af Amer 11 (L) >60 mL/min   GFR calc Af Amer 13 (L) >60 mL/min   Anion gap 11 5 - 15  Protime-INR  Result Value Ref Range   Prothrombin Time 15.0 11.4 - 15.2 seconds   INR 1.19    No results found. Radiology No results found.  Procedures Procedures (including critical care time)  Medications Ordered in ED Medications - No data to display  Morphine ordered for pain Initial Impression / Assessment and Plan / ED Course  I have reviewed the triage vital signs and the nursing notes.  Pertinent labs & imaging results that were available during my care of the patient were reviewed by me and considered in my medical decision making (see chart for details).     Consult to Dr. Donnetta Hutching from vascular surgery who suggested transfer to [facility] for evaluation by vascular surgeon.  He suggests Ancef 1 g IV in light of redness and tenderness of medial thigh.  We discussed possibility of DVT.  He does not feel patient needs emergent anticoagulation pending study to rule out phlebitis.  He will see patient while in hospital.  He is requesting admission to medical service in light of patient's other symptoms.  I consulted Dr. Olevia Bowens who will arrange for [facility]  lab work consistent with anemia hemoglobin unchanged from 9 days ago chronic renal insufficiency, differential diagnosis of right leg swelling includes DVT, infection, postoperative swelling.  Patient  will also need evaluation of syncope.  He did complain of diplopia which was transient Final Clinical Impressions(s) / ED Diagnoses  Diagnoses #1 right leg pain and swelling #2 swelling of genitalia #3 diplopia #4 chronic renal insufficiency #5 anemia #6 syncope   Final diagnoses:  None    ED Discharge Orders    None       Orlie Dakin, MD 08/25/17 [phone]

## 2017-08-24 NOTE — Progress Notes (Addendum)
Pt arrived to 4E19 via [facility]. VSS w/ high BP noted (142/106), pm BP medications given. Rating groin pain 8/10, PRN given. R leg and groin swollen. Palpable DP pulses bilaterally. Reported that pt c/o double vision, however denies right now and is neuro intact. Pt updated on plan of care. Call bell within reach. Will continue to monitor.  Jaymes Graff, RN

## 2017-08-25 ENCOUNTER — Inpatient Hospital Stay (HOSPITAL_COMMUNITY): Payer: 59

## 2017-08-25 DIAGNOSIS — I739 Peripheral vascular disease, unspecified: Secondary | ICD-10-CM

## 2017-08-25 DIAGNOSIS — N186 End stage renal disease: Secondary | ICD-10-CM

## 2017-08-25 DIAGNOSIS — R55 Syncope and collapse: Secondary | ICD-10-CM

## 2017-08-25 DIAGNOSIS — I480 Paroxysmal atrial fibrillation: Secondary | ICD-10-CM

## 2017-08-25 DIAGNOSIS — K210 Gastro-esophageal reflux disease with esophagitis: Secondary | ICD-10-CM

## 2017-08-25 DIAGNOSIS — I361 Nonrheumatic tricuspid (valve) insufficiency: Secondary | ICD-10-CM

## 2017-08-25 DIAGNOSIS — I10 Essential (primary) hypertension: Secondary | ICD-10-CM

## 2017-08-25 LAB — ECHOCARDIOGRAM COMPLETE
HEIGHTINCHES: 75 in
Weight: 3502.67 oz

## 2017-08-25 LAB — GLUCOSE, CAPILLARY: Glucose-Capillary: 140 mg/dL — ABNORMAL HIGH (ref 65–99)

## 2017-08-25 MED ORDER — HYDROMORPHONE HCL 1 MG/ML IJ SOLN
1.0000 mg | INTRAMUSCULAR | Status: DC | PRN
  Administered 2017-08-25 – 2017-09-05 (×57): 1 mg via INTRAVENOUS
  Filled 2017-08-25 (×57): qty 1

## 2017-08-25 MED ORDER — LORAZEPAM 1 MG PO TABS
1.0000 mg | ORAL_TABLET | Freq: Once | ORAL | Status: AC
  Administered 2017-08-25: 1 mg via ORAL
  Filled 2017-08-25: qty 1

## 2017-08-25 MED ORDER — FUROSEMIDE 10 MG/ML IJ SOLN
20.0000 mg | Freq: Once | INTRAMUSCULAR | Status: AC
  Administered 2017-08-25: 20 mg via INTRAVENOUS
  Filled 2017-08-25: qty 2

## 2017-08-25 NOTE — Progress Notes (Signed)
  Echocardiogram 2D Echocardiogram has been performed.  Merrie Roof F 08/25/2017, 11:48 AM

## 2017-08-25 NOTE — Progress Notes (Signed)
Preliminary results by tech - Lower Ext. Venous Duplex Completed. Negative for bilateral deep vein thrombosis. There is evidence of a large, non vascularized, cystic structure located in the right proximal thigh area, which may represent a hematoma. Oda Cogan, BS, RDMS, RVT

## 2017-08-25 NOTE — Progress Notes (Signed)
Preliminary results by tech - Carotid Duplex Completed. Right ICA 1-39% stenosis, Left ICA 40-59% stenosis. Bilateral vertebral arteries are patent with antegrade flow. Oda Cogan, BS, RDMS, RVT

## 2017-08-25 NOTE — Progress Notes (Signed)
    Subjective  -   S/p Right femoral endarterectomy and recanalization of occluded right iliac artery with stenting  Patient says he developed scrotal and penis swelling 2 days ago.  It is very painful No fevers or drainage    Physical Exam:  Significant scrotal edema U/S shows thigh hematoma    Assessment/Plan:    Discussed with patient elevation and patience are needed until this resolves. I will change pain meds to dilaudid Will give IV Lasix    Wells Brabham 08/25/2017 11:50 AM --  Vitals:   08/24/17 2320 08/25/17 0356  BP:  135/82  Pulse:  62  Resp:  16  Temp:  (!) 97.5 F (36.4 C)  SpO2: 100% 100%    Intake/Output Summary (Last 24 hours) at 08/25/2017 1150 Last data filed at 08/25/2017 0800 Gross per 24 hour  Intake 50 ml  Output 360 ml  Net -310 ml     Laboratory CBC    Component Value Date/Time   WBC 8.5 08/24/2017 2253   HGB 10.0 (L) 08/24/2017 2253   HCT 31.3 (L) 08/24/2017 2253   PLT 247 08/24/2017 2253    BMET    Component Value Date/Time   NA 138 08/24/2017 1808   K 4.6 08/24/2017 1808   CL 103 08/24/2017 1808   CO2 24 08/24/2017 1808   GLUCOSE 110 (H) 08/24/2017 1808   BUN 54 (H) 08/24/2017 1808   CREATININE 5.19 (H) 08/24/2017 1808   CALCIUM 7.8 (L) 08/24/2017 1808   GFRNONAA 11 (L) 08/24/2017 1808   GFRAA 13 (L) 08/24/2017 1808    COAG Lab Results  Component Value Date   INR 1.19 08/24/2017   INR 1.05 08/14/2017   INR 1.07 08/11/2017   No results found for: PTT  Antibiotics Anti-infectives (From admission, onward)   Start     Dose/Rate Route Frequency Ordered Stop   08/25/17 1000  ceFAZolin (ANCEF) IVPB 1 g/50 mL premix     1 g 100 mL/hr over 30 Minutes Intravenous Every 12 hours 08/24/17 2223     08/24/17 1915  ceFAZolin (ANCEF) IVPB 1 g/50 mL premix     1 g 100 mL/hr over 30 Minutes Intravenous  Once 08/24/17 1911 08/24/17 2120       V. Leia Alf, M.D. [facility]  [place] Office: [phone] Pager:  [phone]

## 2017-08-25 NOTE — Progress Notes (Signed)
PROGRESS NOTE    Aaron [name]  XMI:[id number] DOB: 1955-05-10 DOA: 08/24/2017 PCP: [facility] [place]      Brief Narrative:  Aaron [name] is a 62 y.o. M with anxiety, osteoarthritis of knees, back and shoulders, CAD (mild nonobstructive disease in 2002), chronic back pain, stage II CKD, COPD, polycystic kidney disease, diverticulosis, GERD, hypertension, history of treated hep C,  history of urolithiasis, hyperlipidemia, paroxysmal atrial tachycardia, history of syncopal episodes, peripheral arterial disease who is 10 days post-op from abdominal aortogram with lower extremity angiography, which was uneventful until this morning when he woke up with right groin pain and noticed right groin area and genitalia edema.     Assessment & Plan:  Groin swelling Recent femoral bypass Prelim report of ultrasound is unremarkable.  Vascular have evaluated patient and feel this is expected scrotal edema post-op, just severe.  Recommend diuretics, conservative mgmt and pain control. -Continue Plavix  Syncope -Follow up echo and dopplers  Hypertension -Continue carvedilol and doxazosin  GER -Continue PPI  COPD Asymptomatic -Continue albuterol PRN and home daily inhalers  pAF -Continue carvedilol -Defer restarting warfarin to vascular surgery   Anemia of chornic renal disease Stable  Polycystic kidney disease -Continue torsemide  Diplopia Completely resolved.  Doubt CVA nor MG.   If this recurs, however, and has worsening late int he day pattern, will need follow up for MG   DVT prophylaxis: Heparin Code Status: FULL Family Communication: None present MDM and disposition Plan: The below labs and imaging reports were reviewed and summarize dabove.  Case discussed with Vascular surgery.  The patient's status is clinically stable.  This problem will require IV therapy (diuretics) and supportive cares.       Consultants:   Vascular surgery  Procedures:    Ultrasound  Antimicrobials:   None    Subjective: Feeling okay, groin very painful.  No fever, chills.  No nausea, vomiting.  No leg swelling, orthopnea, dyspnea on exertion.  No more diplopia.  No more dizziness.  Objective: Vitals:   08/24/17 2244 08/24/17 2320 08/25/17 0356 08/25/17 1208  BP: (!) 142/106  135/82 135/88  Pulse: 80  62 62  Resp: 12  16 18   Temp: 97.7 F (36.5 C)  (!) 97.5 F (36.4 C) (!) 97.5 F (36.4 C)  TempSrc: Oral  Oral Oral  SpO2: 97% 100% 100% 98%  Weight:   99.3 kg (218 lb 14.7 oz)   Height:        Intake/Output Summary (Last 24 hours) at 08/25/2017 1641 Last data filed at 08/25/2017 1617 Gross per 24 hour  Intake 413 ml  Output 1010 ml  Net -597 ml   Filed Weights   08/24/17 1701 08/24/17 2233 08/25/17 0356  Weight: 89.8 kg (198 lb) 99.2 kg (218 lb 11.1 oz) 99.3 kg (218 lb 14.7 oz)    Examination: General appearance:  adult male, alert and in no acute distress.   HEENT: Anicteric, conjunctiva pink, lids and lashes normal. No nasal deformity, discharge, epistaxis.  Lips moist.   Skin: Warm and dry.  no jaundice.  No suspicious rashes or lesions. Cardiac: RRR, nl S1-S2, no murmurs appreciated.  Capillary refill is brisk.  JVP normale.  No LE edema.  Radial pulses 2+ and symmetric. Respiratory: Normal respiratory rate and rhythm.  CTAB without rales or wheezes. Abdomen: Abdomen soft.  No TTP. No ascites, distension, hepatosplenomegaly.   MSK: No deformities or effusions. GU: Edema of the scrotum and penis Neuro: Awake and alert.  EOMI, moves all extremities. Speech fluent.    Psych: Sensorium intact and responding to questions, attention normal. Affect nornakl.  Judgment and insight appear normal.    Data Reviewed: I have personally reviewed following labs and imaging studies:  CBC: Recent Labs  Lab 08/24/17 1808 08/24/17 2253  WBC 9.4 8.5  NEUTROABS 7.3 6.2  HGB 9.4* 10.0*  HCT 29.3* 31.3*  MCV 100.0 101.6*  PLT 222 376    Basic Metabolic Panel: Recent Labs  Lab 08/24/17 1808  NA 138  K 4.6  CL 103  CO2 24  GLUCOSE 110*  BUN 54*  CREATININE 5.19*  CALCIUM 7.8*   GFR: Estimated Creatinine Clearance: 17.9 mL/min (A) (by C-G formula based on SCr of 5.19 mg/dL (H)). Liver Function Tests: Recent Labs  Lab 08/24/17 1808  AST 17  ALT 7*  ALKPHOS 93  BILITOT 0.4  PROT 6.2*  ALBUMIN 2.9*   No results for input(s): LIPASE, AMYLASE in the last 168 hours. No results for input(s): AMMONIA in the last 168 hours. Coagulation Profile: Recent Labs  Lab 08/24/17 1808  INR 1.19   Cardiac Enzymes: No results for input(s): CKTOTAL, CKMB, CKMBINDEX, TROPONINI in the last 168 hours. BNP (last 3 results) No results for input(s): PROBNP in the last 8760 hours. HbA1C: No results for input(s): HGBA1C in the last 72 hours. CBG: Recent Labs  Lab 08/25/17 0615  GLUCAP 140*   Lipid Profile: No results for input(s): CHOL, HDL, LDLCALC, TRIG, CHOLHDL, LDLDIRECT in the last 72 hours. Thyroid Function Tests: No results for input(s): TSH, T4TOTAL, FREET4, T3FREE, THYROIDAB in the last 72 hours. Anemia Panel: No results for input(s): VITAMINB12, FOLATE, FERRITIN, TIBC, IRON, RETICCTPCT in the last 72 hours. Urine analysis:    Component Value Date/Time   COLORURINE STRAW (A) 08/14/2017 0846   APPEARANCEUR CLEAR 08/14/2017 0846   LABSPEC 1.006 08/14/2017 0846   PHURINE 5.0 08/14/2017 0846   GLUCOSEU NEGATIVE 08/14/2017 0846   HGBUR SMALL (A) 08/14/2017 0846   BILIRUBINUR NEGATIVE 08/14/2017 0846   KETONESUR NEGATIVE 08/14/2017 0846   PROTEINUR NEGATIVE 08/14/2017 0846   UROBILINOGEN 0.2 03/03/2010 1056   NITRITE NEGATIVE 08/14/2017 0846   LEUKOCYTESUR NEGATIVE 08/14/2017 0846   Sepsis Labs: @LABRCNTIP (procalcitonin:4,lacticacidven:4)  )No results found for this or any previous visit (from the past 240 hour(s)).       Radiology Studies: Ct Head Wo Contrast  Result Date: 08/24/2017 CLINICAL  DATA:  Fall last night, bilateral diplopia starting today. EXAM: CT HEAD WITHOUT CONTRAST TECHNIQUE: Contiguous axial images were obtained from the base of the skull through the vertex without intravenous contrast. COMPARISON:  Head CT dated 09/22/2014. FINDINGS: Brain: Mild generalized age related parenchymal atrophy with commensurate dilatation of the ventricles and sulci. Ventricles are stable in size and configuration. Again noted is a prominent CSF space adjacent to the RIGHT temporal lobe, compatible with chronic arachnoid cyst. There is no parenchymal mass, hemorrhage or edema appreciated. No extra-axial hemorrhage. Vascular: There are chronic calcified atherosclerotic changes of the large vessels at the skull base. No unexpected hyperdense vessel. Skull: Normal. Negative for fracture or focal lesion. Sinuses/Orbits: Chronic RIGHT maxillary sinus disease. No acute findings. Other: None. IMPRESSION: 1. No acute findings. No intracranial hemorrhage or edema. No skull fracture. 2. Chronic RIGHT maxillary sinus disease. Electronically Signed   By: Franki Cabot M.D.   On: 08/24/2017 21:05   US Pelvis Limited (transabdominal Only)  Result Date: 08/25/2017 CLINICAL DATA:  Inguinal hernia EXAM: LIMITED ULTRASOUND OF PELVIS TECHNIQUE: Limited  transabdominal ultrasound examination of the pelvis was performed. COMPARISON:  CT abdomen and pelvis 04/27/2017 FINDINGS: On prior CT patient has a fat containing LEFT inguinal hernia. Prior CT also demonstrates a probable prior RIGHT inguinal hernia repair. On sonography, echogenic masslike focus is identified at the RIGHT inguinal region which may may either represent sequela/deformity as a result of prior RIGHT inguinal herniorrhaphy or a recurrent RIGHT inguinal hernia. On the LEFT, a heterogeneous focus is seen at the LEFT inguinal canal suspicious for a LEFT inguinal hernia containing fat and fluid. No mass or adenopathy identified. IMPRESSION: Known LEFT inguinal  hernia by prior CT. Apparent prior RIGHT inguinal hernia repair. Sonography demonstrates a LEFT inguinal hernia and either postsurgical changes or potential recurrent hernia at the RIGHT inguinal region; further evaluation by a noncontrast CT exam would be beneficial in assessing for potential recurrent RIGHT inguinal hernia. Electronically Signed   By: Lavonia Dana M.D.   On: 08/25/2017 10:16        Scheduled Meds: . carvedilol  25 mg Oral BID  . cholecalciferol  2,000 Units Oral Daily  . clopidogrel  75 mg Oral Daily  . doxazosin  8 mg Oral QHS  . fluticasone furoate-vilanterol  1 puff Inhalation Daily  . heparin  5,000 Units Subcutaneous Q8H  . mouth rinse  15 mL Mouth Rinse BID  . pantoprazole  40 mg Oral BID  . sodium chloride flush  3 mL Intravenous Q12H  . torsemide  20 mg Oral Daily   Continuous Infusions: .  ceFAZolin (ANCEF) IV Stopped (08/25/17 1354)     LOS: 1 day    Time spent: 25 minutes    Edwin Dada, MD [facility] 08/25/2017, 4:41 PM     Pager [phone] --- please page though AMION:  [email or web address] Password [email or web address] If 7PM-7AM, please contact night-coverage

## 2017-08-26 LAB — BASIC METABOLIC PANEL
Anion gap: 12 (ref 5–15)
BUN: 56 mg/dL — AB (ref 6–20)
CALCIUM: 7.7 mg/dL — AB (ref 8.9–10.3)
CO2: 25 mmol/L (ref 22–32)
CREATININE: 5.66 mg/dL — AB (ref 0.61–1.24)
Chloride: 98 mmol/L — ABNORMAL LOW (ref 101–111)
GFR calc non Af Amer: 10 mL/min — ABNORMAL LOW (ref >60)
GFR, EST AFRICAN AMERICAN: 11 mL/min — AB (ref >60)
GLUCOSE: 110 mg/dL — AB (ref 65–99)
Potassium: 5.9 mmol/L — ABNORMAL HIGH (ref 3.5–5.1)
Sodium: 135 mmol/L (ref 135–145)

## 2017-08-26 LAB — GLUCOSE, CAPILLARY: Glucose-Capillary: 111 mg/dL — ABNORMAL HIGH (ref 65–99)

## 2017-08-26 MED ORDER — FUROSEMIDE 10 MG/ML IJ SOLN: 40.0000 mg | Freq: Every day | INTRAMUSCULAR | Status: DC

## 2017-08-26 MED ORDER — CEPHALEXIN 250 MG PO CAPS
250.0000 mg | ORAL_CAPSULE | Freq: Two times a day (BID) | ORAL | Status: DC
  Administered 2017-08-26 – 2017-09-02 (×14): 250 mg via ORAL
  Filled 2017-08-26 (×16): qty 1

## 2017-08-26 MED ORDER — SODIUM POLYSTYRENE SULFONATE 15 GM/60ML PO SUSP
30.0000 g | Freq: Once | ORAL | Status: DC
  Filled 2017-08-26: qty 120

## 2017-08-26 MED ORDER — FUROSEMIDE 10 MG/ML IJ SOLN: 20.0000 mg | Freq: Every day | INTRAMUSCULAR | Status: DC

## 2017-08-26 NOTE — Progress Notes (Addendum)
  Progress Note    08/26/2017 8:34 AM  Subjective:  Complains of scrotal pain secondary to edema   Vitals:   08/26/17 0515 08/26/17 0808  BP: (!) 159/90 (!) 160/117  Pulse: 66   Resp: 14 (!) 23  Temp: 98.3 F (36.8 C)   SpO2: 93%    Physical Exam: Lungs:  nonlabored Incisions:  R groin incision intact; firm, small hematoma medial to incision Extremities:  Palpable R DP; L DP palpable Abdomen:  soft Neurologic: A&O  CBC    Component Value Date/Time   WBC 8.5 08/24/2017 2253   RBC 3.08 (L) 08/24/2017 2253   HGB 10.0 (L) 08/24/2017 2253   HCT 31.3 (L) 08/24/2017 2253   PLT 247 08/24/2017 2253   MCV 101.6 (H) 08/24/2017 2253   MCH 32.5 08/24/2017 2253   MCHC 31.9 08/24/2017 2253   RDW 15.4 08/24/2017 2253   LYMPHSABS 1.2 08/24/2017 2253   MONOABS 0.8 08/24/2017 2253   EOSABS 0.3 08/24/2017 2253   BASOSABS 0.0 08/24/2017 2253    BMET    Component Value Date/Time   NA 138 08/24/2017 1808   K 4.6 08/24/2017 1808   CL 103 08/24/2017 1808   CO2 24 08/24/2017 1808   GLUCOSE 110 (H) 08/24/2017 1808   BUN 54 (H) 08/24/2017 1808   CREATININE 5.19 (H) 08/24/2017 1808   CALCIUM 7.8 (L) 08/24/2017 1808   GFRNONAA 11 (L) 08/24/2017 1808   GFRAA 13 (L) 08/24/2017 1808    INR    Component Value Date/Time   INR 1.19 08/24/2017 1808     Intake/Output Summary (Last 24 hours) at 08/26/2017 0834 Last data filed at 08/26/2017 0815 Gross per 24 hour  Intake 463 ml  Output 1950 ml  Net -1487 ml     Assessment/Plan:  62 y.o. male with R groin hematoma s/p R CFA endart with R iliac stenting 08/14/17  Continue plavix Palpable pedal pulses; periodic pulse checks US shows hematoma R groin however incision intact and no drainage; continue to monitor Dr. Trula Slade verbalized order for supportive girdle to help with scrotal pain Medical management per primary team   [name], PA-C [facility] [phone] 08/26/2017 8:34 AM  I agree with the  above.  I have seen and examined the patient.  He is still very uncomfortable from is scrotal swelling.  Continue to keep it elevated and iced.  Will try and get a scrotal support. He is not ready to go home yet.  [name]

## 2017-08-26 NOTE — Progress Notes (Signed)
PROGRESS NOTE    Aaron [name]  QIO:[id number] DOB: December 18, 1955 DOA: 08/24/2017 PCP: [facility] [place]      Brief Narrative:  Aaron [name] is a 62 y.o. M with anxiety, osteoarthritis of knees, back and shoulders, CAD (mild nonobstructive disease in 2002), chronic back pain, stage V CKD, COPD, polycystic kidney disease, diverticulosis, GERD, hypertension, history of treated hep C,  history of urolithiasis, hyperlipidemia, paroxysmal atrial tachycardia, history of syncopal episodes, peripheral arterial disease who is 10 days post-op from abdominal aortogram with lower extremity angiography, which was uneventful until this morning when he woke up with right groin pain and noticed right groin area and genitalia edema.     Assessment & Plan:   Recent femoral bypass Right groin cyst Ultrasound of hte right leg shows "large, complex cystic structure in the right proximal thigh measuring approximately 5.2 x 4.5 x 4.7 cm".   -Vascular surgery are following -Continue Plavix  Scrotal edema He has edema of the right leg, groin, and some abdomen swelling.  Seems out of proportion to what would be expected after surgery. I favor that this is systemic fluid overload (I.e. CHF, from his CKD V) rather than compression of a vein or lymphatic. Unable to obtain CT with contrast however.  And diuresis will be difficult with his poor renal function. -Continue torsemide -Trend BMP daily and consult Nephrology if worsening renal function or no improvement with diuretics.  Polycystic kidney disease Chronic kidney disease stage V, not on HD Baseline Cr 4.8-5.5.  No acidosis, not on Bicarb now.  Mild hyperkalemia. -Continue torsemide today -Daily BMP -If Cr rising tomorrow despite holding Lasix today, will consult Nephrology re: diuresis of this patient with CKD V not yet on HD  Hyperkalemia -Torsemide once today -Kayexalate 30 g once -Repeat K tomorrow  Paroxysmal atrial  fibrillation -Continue carvedilol -Defer restarting warfarin to vascular surgery  Hypertension Hypertensive -Continue carvedilol and doxazosin  GER -Continue PPI  Syncope, orthostasis Echo and dopplers negative.  Symptoms are orthostatic.   He is still somewhat   COPD Asymptomatic -Continue albuterol PRN and home daily inhalers  Anemia of chornic renal disease Stable  Diplopia There was a question of diplopia on admission.  Completely resolved and not recurred.  Doubt CVA nor MG.         DVT prophylaxis: Heparin Code Status: FULL Family Communication: None present MDM and disposition Plan: The below labs and imaging reports were reviewed and summarize dabove.  Case discussed with Vascular surgery.  The patient's status is clinically stable.  This problem will require IV therapy (diuretics) and supportive cares.       Consultants:   Vascular surgery  Procedures:   Ultrasound  Antimicrobials:   None    Subjective: Still a lot of pain.  Burning in his penis (not with urination, just with movement).  No fever, chills, nausea, vomiting.  THe right leg is swollen and uncomfortable, but the left leg has no swelling at all, feels great.  No more diplopia.  Dizzy with standing.         Objective: Vitals:   08/26/17 0135 08/26/17 0515 08/26/17 0808 08/26/17 0852  BP: (!) 168/92 (!) 159/90 (!) 160/117   Pulse:  66    Resp: (!) 25 14 (!) 23   Temp:  98.3 F (36.8 C)    TempSrc:  Oral    SpO2:  93%  95%  Weight:  102.2 kg (225 lb 5 oz)    Height:  Intake/Output Summary (Last 24 hours) at 08/26/2017 1548 Last data filed at 08/26/2017 1300 Gross per 24 hour  Intake 463 ml  Output 1300 ml  Net -837 ml   Filed Weights   08/24/17 2233 08/25/17 0356 08/26/17 0515  Weight: 99.2 kg (218 lb 11.1 oz) 99.3 kg (218 lb 14.7 oz) 102.2 kg (225 lb 5 oz)    Examination: General appearance: Adult male, sitting in chair, no acute distress.   HEENT: Anicteric,  conjunctive are pink, lids and lashes normal.  No nasal deformity, discharge, or epistaxis.  Lips moist. Skin: Skin warm and dry, no jaundice, no suspicious rashes or lesions.  The skin around the right groin wound is normal.  There is mild edema of the right leg, no edema of the left leg. Cardiac: RRR no murmurs right leg edema, no left leg edema. Respiratory: Normal respiratory rate and rhythm, lungs clear without rales or wheezes Abdomen: Abdomen soft, no tenderness to palpation, no ascites, distention, hepatospleno megaly MSK: No deformities or effusions. GU: Scrotum edema is worse today Neuro: Awake and alert, cranial nerves normal, extraocular movements intact, moves all extremities with normal strength and coordination, gait antalgic due to pain and swelling, speech fluent Psych: Sensorium intact and responding to questions, intention normal, affect normal, judgment insight normal    Data Reviewed: I have personally reviewed following labs and imaging studies:  CBC: Recent Labs  Lab 08/24/17 1808 08/24/17 2253  WBC 9.4 8.5  NEUTROABS 7.3 6.2  HGB 9.4* 10.0*  HCT 29.3* 31.3*  MCV 100.0 101.6*  PLT 222 742   Basic Metabolic Panel: Recent Labs  Lab 08/24/17 1808 08/26/17 1200  NA 138 135  K 4.6 5.9*  CL 103 98*  CO2 24 25  GLUCOSE 110* 110*  BUN 54* 56*  CREATININE 5.19* 5.66*  CALCIUM 7.8* 7.7*   GFR: Estimated Creatinine Clearance: 17.8 mL/min (A) (by C-G formula based on SCr of 5.66 mg/dL (H)). Liver Function Tests: Recent Labs  Lab 08/24/17 1808  AST 17  ALT 7*  ALKPHOS 93  BILITOT 0.4  PROT 6.2*  ALBUMIN 2.9*   No results for input(s): LIPASE, AMYLASE in the last 168 hours. No results for input(s): AMMONIA in the last 168 hours. Coagulation Profile: Recent Labs  Lab 08/24/17 1808  INR 1.19   Cardiac Enzymes: No results for input(s): CKTOTAL, CKMB, CKMBINDEX, TROPONINI in the last 168 hours. BNP (last 3 results) No results for input(s): PROBNP  in the last 8760 hours. HbA1C: No results for input(s): HGBA1C in the last 72 hours. CBG: Recent Labs  Lab 08/25/17 0615 08/26/17 0626  GLUCAP 140* 111*   Lipid Profile: No results for input(s): CHOL, HDL, LDLCALC, TRIG, CHOLHDL, LDLDIRECT in the last 72 hours. Thyroid Function Tests: No results for input(s): TSH, T4TOTAL, FREET4, T3FREE, THYROIDAB in the last 72 hours. Anemia Panel: No results for input(s): VITAMINB12, FOLATE, FERRITIN, TIBC, IRON, RETICCTPCT in the last 72 hours. Urine analysis:    Component Value Date/Time   COLORURINE STRAW (A) 08/14/2017 0846   APPEARANCEUR CLEAR 08/14/2017 0846   LABSPEC 1.006 08/14/2017 0846   PHURINE 5.0 08/14/2017 0846   GLUCOSEU NEGATIVE 08/14/2017 0846   HGBUR SMALL (A) 08/14/2017 0846   BILIRUBINUR NEGATIVE 08/14/2017 0846   KETONESUR NEGATIVE 08/14/2017 0846   PROTEINUR NEGATIVE 08/14/2017 0846   UROBILINOGEN 0.2 03/03/2010 1056   NITRITE NEGATIVE 08/14/2017 0846   LEUKOCYTESUR NEGATIVE 08/14/2017 0846   Sepsis Labs: @LABRCNTIP (procalcitonin:4,lacticacidven:4)  )No results found for this or any previous  visit (from the past 240 hour(s)).       Radiology Studies: Ct Head Wo Contrast  Result Date: 08/24/2017 CLINICAL DATA:  Fall last night, bilateral diplopia starting today. EXAM: CT HEAD WITHOUT CONTRAST TECHNIQUE: Contiguous axial images were obtained from the base of the skull through the vertex without intravenous contrast. COMPARISON:  Head CT dated 09/22/2014. FINDINGS: Brain: Mild generalized age related parenchymal atrophy with commensurate dilatation of the ventricles and sulci. Ventricles are stable in size and configuration. Again noted is a prominent CSF space adjacent to the RIGHT temporal lobe, compatible with chronic arachnoid cyst. There is no parenchymal mass, hemorrhage or edema appreciated. No extra-axial hemorrhage. Vascular: There are chronic calcified atherosclerotic changes of the large vessels at the  skull base. No unexpected hyperdense vessel. Skull: Normal. Negative for fracture or focal lesion. Sinuses/Orbits: Chronic RIGHT maxillary sinus disease. No acute findings. Other: None. IMPRESSION: 1. No acute findings. No intracranial hemorrhage or edema. No skull fracture. 2. Chronic RIGHT maxillary sinus disease. Electronically Signed   By: Franki Cabot M.D.   On: 08/24/2017 21:05   US Pelvis Limited (transabdominal Only)  Result Date: 08/25/2017 CLINICAL DATA:  Inguinal hernia EXAM: LIMITED ULTRASOUND OF PELVIS TECHNIQUE: Limited transabdominal ultrasound examination of the pelvis was performed. COMPARISON:  CT abdomen and pelvis 04/27/2017 FINDINGS: On prior CT patient has a fat containing LEFT inguinal hernia. Prior CT also demonstrates a probable prior RIGHT inguinal hernia repair. On sonography, echogenic masslike focus is identified at the RIGHT inguinal region which may may either represent sequela/deformity as a result of prior RIGHT inguinal herniorrhaphy or a recurrent RIGHT inguinal hernia. On the LEFT, a heterogeneous focus is seen at the LEFT inguinal canal suspicious for a LEFT inguinal hernia containing fat and fluid. No mass or adenopathy identified. IMPRESSION: Known LEFT inguinal hernia by prior CT. Apparent prior RIGHT inguinal hernia repair. Sonography demonstrates a LEFT inguinal hernia and either postsurgical changes or potential recurrent hernia at the RIGHT inguinal region; further evaluation by a noncontrast CT exam would be beneficial in assessing for potential recurrent RIGHT inguinal hernia. Electronically Signed   By: Lavonia Dana M.D.   On: 08/25/2017 10:16        Scheduled Meds: . carvedilol  25 mg Oral BID  . cephALEXin  250 mg Oral Q12H  . cholecalciferol  2,000 Units Oral Daily  . clopidogrel  75 mg Oral Daily  . doxazosin  8 mg Oral QHS  . fluticasone furoate-vilanterol  1 puff Inhalation Daily  . heparin  5,000 Units Subcutaneous Q8H  . mouth rinse  15 mL  Mouth Rinse BID  . pantoprazole  40 mg Oral BID   Continuous Infusions:    LOS: 2 days    Time spent: 25 minutes    Edwin Dada, MD [facility] 08/26/2017, 10:30 AM     Pager [phone] --- please page though AMION:  [email or web address] Password [email or web address] If 7PM-7AM, please contact night-coverage

## 2017-08-27 ENCOUNTER — Inpatient Hospital Stay (HOSPITAL_COMMUNITY): Payer: 59

## 2017-08-27 LAB — GLUCOSE, CAPILLARY: GLUCOSE-CAPILLARY: 109 mg/dL — AB (ref 65–99)

## 2017-08-27 LAB — BASIC METABOLIC PANEL
Anion gap: 12 (ref 5–15)
BUN: 58 mg/dL — AB (ref 6–20)
CO2: 25 mmol/L (ref 22–32)
CREATININE: 5.84 mg/dL — AB (ref 0.61–1.24)
Calcium: 7.6 mg/dL — ABNORMAL LOW (ref 8.9–10.3)
Chloride: 99 mmol/L — ABNORMAL LOW (ref 101–111)
GFR calc Af Amer: 11 mL/min — ABNORMAL LOW (ref >60)
GFR calc non Af Amer: 9 mL/min — ABNORMAL LOW (ref >60)
Glucose, Bld: 108 mg/dL — ABNORMAL HIGH (ref 65–99)
Potassium: 5.4 mmol/L — ABNORMAL HIGH (ref 3.5–5.1)
Sodium: 136 mmol/L (ref 135–145)

## 2017-08-27 MED ORDER — FUROSEMIDE 10 MG/ML IJ SOLN
160.0000 mg | Freq: Three times a day (TID) | INTRAVENOUS | Status: DC
  Administered 2017-08-27 – 2017-08-28 (×3): 160 mg via INTRAVENOUS
  Filled 2017-08-27: qty 4
  Filled 2017-08-27: qty 10
  Filled 2017-08-27: qty 16

## 2017-08-27 NOTE — Progress Notes (Addendum)
  Progress Note    08/27/2017 7:48 AM  Subjective:  Persistent scrotal edema and pain   Vitals:   08/26/17 1935 08/27/17 0428  BP: (!) 153/90 (!) 149/93  Pulse: 65 (!) 112  Resp: 16   Temp: 97.7 F (36.5 C) 97.9 F (36.6 C)  SpO2: 98% 97%   Physical Exam: Lungs:  Non labored Incisions:  R groin incision intact without drainage; small but firm hematoma medial to incision Extremities:  Palpable DP bilaterally; RLE more edematous today Abdomen:  Soft Neurologic: A&O  CBC    Component Value Date/Time   WBC 8.5 08/24/2017 2253   RBC 3.08 (L) 08/24/2017 2253   HGB 10.0 (L) 08/24/2017 2253   HCT 31.3 (L) 08/24/2017 2253   PLT 247 08/24/2017 2253   MCV 101.6 (H) 08/24/2017 2253   MCH 32.5 08/24/2017 2253   MCHC 31.9 08/24/2017 2253   RDW 15.4 08/24/2017 2253   LYMPHSABS 1.2 08/24/2017 2253   MONOABS 0.8 08/24/2017 2253   EOSABS 0.3 08/24/2017 2253   BASOSABS 0.0 08/24/2017 2253    BMET    Component Value Date/Time   NA 136 08/27/2017 0405   K 5.4 (H) 08/27/2017 0405   CL 99 (L) 08/27/2017 0405   CO2 25 08/27/2017 0405   GLUCOSE 108 (H) 08/27/2017 0405   BUN 58 (H) 08/27/2017 0405   CREATININE 5.84 (H) 08/27/2017 0405   CALCIUM 7.6 (L) 08/27/2017 0405   GFRNONAA 9 (L) 08/27/2017 0405   GFRAA 11 (L) 08/27/2017 0405    INR    Component Value Date/Time   INR 1.19 08/24/2017 1808     Intake/Output Summary (Last 24 hours) at 08/27/2017 0748 Last data filed at 08/26/2017 1806 Gross per 24 hour  Intake 600 ml  Output 800 ml  Net -200 ml     Assessment/Plan:  62 y.o. male with R groin hematoma s/p R CFA endart with R iliac stenting 08/14/17  Continue plavix Palpable pedal pulses Persistent scrotal edema; continue to elevate and ice; diuretics Home when more comfortable  DVT prophylaxis:  subq heparin   [name], PA-C [facility] [phone] 08/27/2017 7:48 AM  Remains very uncomfortable.  Will get CT scan without contrast  to eval edema and possible hydrocele?  [name]

## 2017-08-27 NOTE — Care Management Note (Signed)
Case Management Note Marvetta Gibbons RN, BSN Unit 4E-Case Manager [phone]  Patient Details  Name: STEFFON GLADU MRN: [id number] Date of Birth: 1955-05-23  Subjective/Objective:  Pt admitted with R groin hematoma s/p R CFA endart with R iliac stenting 08/14/17- scrotal edema, vascular following                  Action/Plan: PTA pt lived at home- independent, anticipate return home- CM to follow for transition of care needs.   Expected Discharge Date:                  Expected Discharge Plan:     In-House Referral:     Discharge planning Services  CM Consult  Post Acute Care Choice:    Choice offered to:     DME Arranged:    DME Agency:     HH Arranged:    HH Agency:     Status of Service:  In process, will continue to follow  If discussed at Long Length of Stay Meetings, dates discussed:    Discharge Disposition:   Additional Comments:  Dawayne Patricia, RN 08/27/2017, 2:02 PM

## 2017-08-27 NOTE — Progress Notes (Addendum)
PROGRESS NOTE    Aaron [name]  HKV:[id number] DOB: 1956/02/21 DOA: 08/24/2017 PCP: Gwenlyn Saran, [place]  Brief Narrative: 62 y.o. M with anxiety, osteoarthritis of knees, back and shoulders, CAD (mild nonobstructive disease in 2002), chronic back pain, stage V CKD, COPD, polycystic kidney disease, diverticulosis, GERD, hypertension, history of treated hep C, history of urolithiasis, hyperlipidemia, paroxysmal atrial tachycardia, history of syncopal episodes, peripheral arterial disease who is 10 days post-op from abdominal aortogram with lower extremity angiography, which was uneventful until this morning when he woke up with right groin pain and noticed right groin area and genitalia edema.      Assessment & Plan:   Principal Problem:   Groin swelling Active Problems:   Essential hypertension   Esophageal reflux   SYNCOPE   PAD (peripheral artery disease) (HCC)   COPD (chronic obstructive pulmonary disease) (HCC)   Paroxysmal atrial fibrillation (HCC)   Hyperlipidemia  Recent femoral bypass Right groin cyst Ultrasound of hte right leg shows "large, complex cystic structure in the right proximal thigh measuring approximately 5.2 x 4.5 x 4.7 cm".   -Vascular surgery following -Continue Plavix  Scrotal edema He has edema of the right leg, groin, and some abdomen swelling.  Seems out of proportion to what would be expected after surgery. I favor that this is systemic fluid overload (I.e. CHF, from his CKD V) rather than compression of a vein or lymphatic. Unable to obtain CT with contrast however.  And diuresis will be difficult with his poor renal function. -Continue torsemide -Trend BMP daily and consult Nephrology if worsening renal function or no improvement with diuretics.text messaged renal for consult ?NEED FOR HD not able to diurese?note CT of scrotum ordered.  Polycystic kidney disease Chronic kidney disease stage V, not on HD Baseline Cr 4.8-5.5.   No acidosis, not on Bicarb now.  Mild hyperkalemia. -Continue torsemide today -Daily BMP -If Cr rising tomorrow despite holding Lasix today, will consult Nephrology re: diuresis of this patient with CKD V not yet on HD Hyperkalemia -Torsemide once today -Kayexalate 30 g once -Repeat K tomorrow  Paroxysmal atrial fibrillation -Continue carvedilol -Defer restarting warfarin to vascular surgery  Hypertension Hypertensive -Continue carvedilol and doxazosin  GER  Syncope, orthostasis Echo and dopplers negative.  Symptoms are orthostatic.   He is still somewhat   COPD Asymptomatic -Continue albuterol PRN and home daily inhalers  Anemia of chornic renal disease Stable  Diplopia There was a question of diplopia on admission.  Completely resolved and not recurred.  Doubt CVA nor MG.      DVT prophylaxis:heparin Code Status: full Family Communication:none Disposition Plan: tbd Consultants: vascular,renal pending.  Procedures:none Antimicrobials:none Subjective: Complaints of fire in the scrotum penis swollen not better with diuretics.  Objective: Vitals:   08/26/17 1756 08/26/17 1935 08/27/17 0428 08/27/17 0833  BP: (!) 141/78 (!) 153/90 (!) 149/93   Pulse: 77 65 (!) 112   Resp: 17 16    Temp: 98 F (36.7 C) 97.7 F (36.5 C) 97.9 F (36.6 C)   TempSrc: Axillary Oral Oral   SpO2: 100% 98% 97% 98%  Weight:   100 kg (220 lb 8 oz)   Height:        Intake/Output Summary (Last 24 hours) at 08/27/2017 0946 Last data filed at 08/27/2017 0200 Gross per 24 hour  Intake 480 ml  Output 1350 ml  Net -870 ml   Filed Weights   08/25/17 0356 08/26/17 0515 08/27/17 0428  Weight: 99.3 kg (218 lb  14.7 oz) 102.2 kg (225 lb 5 oz) 100 kg (220 lb 8 oz)    Examination:  General exam: Appears calm and comfortable  Respiratory system: Clear to auscultation. Respiratory effort normal. Cardiovascular system: S1 & S2 heard, RRR. No JVD, murmurs, rubs, gallops or clicks.  No pedal edema. Gastrointestinal system: Abdomen is nondistended, soft and nontender. No organomegaly or masses felt. Normal bowel sounds heard. Central nervous system: Alert and oriented. No focal neurological deficits. Extremities: SCROTAL AND PENILE EDEMA Skin: No rashes, lesions or ulcers Psychiatry: Judgement and insight appear normal. Mood & affect appropriate.     Data Reviewed: I have personally reviewed following labs and imaging studies  CBC: Recent Labs  Lab 08/24/17 1808 08/24/17 2253  WBC 9.4 8.5  NEUTROABS 7.3 6.2  HGB 9.4* 10.0*  HCT 29.3* 31.3*  MCV 100.0 101.6*  PLT 222 950   Basic Metabolic Panel: Recent Labs  Lab 08/24/17 1808 08/26/17 1200 08/27/17 0405  NA 138 135 136  K 4.6 5.9* 5.4*  CL 103 98* 99*  CO2 24 25 25   GLUCOSE 110* 110* 108*  BUN 54* 56* 58*  CREATININE 5.19* 5.66* 5.84*  CALCIUM 7.8* 7.7* 7.6*   GFR: Estimated Creatinine Clearance: 15.9 mL/min (A) (by C-G formula based on SCr of 5.84 mg/dL (H)). Liver Function Tests: Recent Labs  Lab 08/24/17 1808  AST 17  ALT 7*  ALKPHOS 93  BILITOT 0.4  PROT 6.2*  ALBUMIN 2.9*   No results for input(s): LIPASE, AMYLASE in the last 168 hours. No results for input(s): AMMONIA in the last 168 hours. Coagulation Profile: Recent Labs  Lab 08/24/17 1808  INR 1.19   Cardiac Enzymes: No results for input(s): CKTOTAL, CKMB, CKMBINDEX, TROPONINI in the last 168 hours. BNP (last 3 results) No results for input(s): PROBNP in the last 8760 hours. HbA1C: No results for input(s): HGBA1C in the last 72 hours. CBG: Recent Labs  Lab 08/25/17 0615 08/26/17 0626 08/27/17 0422  GLUCAP 140* 111* 109*   Lipid Profile: No results for input(s): CHOL, HDL, LDLCALC, TRIG, CHOLHDL, LDLDIRECT in the last 72 hours. Thyroid Function Tests: No results for input(s): TSH, T4TOTAL, FREET4, T3FREE, THYROIDAB in the last 72 hours. Anemia Panel: No results for input(s): VITAMINB12, FOLATE, FERRITIN, TIBC,  IRON, RETICCTPCT in the last 72 hours. Sepsis Labs: No results for input(s): PROCALCITON, LATICACIDVEN in the last 168 hours.  No results found for this or any previous visit (from the past 240 hour(s)).       Radiology Studies: No results found.      Scheduled Meds: . carvedilol  25 mg Oral BID  . cephALEXin  250 mg Oral Q12H  . cholecalciferol  2,000 Units Oral Daily  . clopidogrel  75 mg Oral Daily  . doxazosin  8 mg Oral QHS  . fluticasone furoate-vilanterol  1 puff Inhalation Daily  . heparin  5,000 Units Subcutaneous Q8H  . mouth rinse  15 mL Mouth Rinse BID  . pantoprazole  40 mg Oral BID  . sodium polystyrene  30 g Oral Once   Continuous Infusions:   LOS: 3 days    Georgette Shell, MD [facility]  If 7PM-7AM, please contact night-coverage [email or web address] Password [facility] 08/27/2017, 9:46 AM

## 2017-08-27 NOTE — Consult Note (Signed)
Patient was seen by [facility] on 08/14/17.  He is a 62 yo male with polycystic kidney disease, CKD V, PAD s/p L SFA stenting in 09/2014, Afib, COPD, HTN, h/o hep C, HLD, CAD who presented with severe claudication of R leg. He had an angiogram in May showing a R external iliac occlusion and on 5/3 and underwent a R femoral endarterectomy with iliac stenting.  He is treated by Dr. Joelyn Oms for his PCKD. He has stage V CKD.  On 5/4 cr was 4.59m/dl. On 5/4 5.01. He represents with post op swelling of his genital area and RLE over several days.  On 5/13 cr was 5.158mdl and today 5.8430ml.  He c/o diffuse swelling and orthopnea.   Past Medical History:  Diagnosis Date  . Anxiety   . Arthritis    knees , back , shoulders  . CAD (coronary artery disease)    Mild nonobstructive disease at cardiac catheterization 2002  . Chronic back pain   . CKD (chronic kidney disease), stage II   . COPD (chronic obstructive pulmonary disease) ([facility] . Diverticulitis   . Esophageal reflux   . Essential hypertension   . Hepatitis C    states he no longer has this  . History of kidney stones   . History of syncope   . Hyperlipidemia   . Nephrolithiasis   . PAT (paroxysmal atrial tachycardia) ([place] . Peripheral arterial disease (HCC)    Occluded left superficial femoral artery status post stent June 2016 - Dr. BraTrula Slade Pneumonia    as child   4 y73ars old  . Polycystic kidney, unspecified type   . Syncope 09/2014   Past Surgical History:  Procedure Laterality Date  . ABDOMINAL AORTOGRAM N/A 08/11/2017   Procedure: ABDOMINAL AORTOGRAM;  Surgeon: BraSerafina MitchellD;  [facility];  Service: Cardiovascular;  Laterality: N/A;  . APPLICATION OF WOUND VAC Right 08/14/2017   Procedure: APPLICATION OF PREVENA INCISIONAL WOUND VAC RIGHT GROIN;  Surgeon: BraSerafina MitchellD;  [facility];  Laterality: Right;  . BACK SURGERY    . BIOPSY  12/17/2016   Procedure: BIOPSY;  Surgeon:  RouDaneil DolinD;  [facility];  Service: Gastroenterology;;  gastric colon  . BIOPSY  04/29/2017   Procedure: BIOPSY;  Surgeon: RouDaneil DolinD;  [facility];  Service: Endoscopy;;  duodenal biopsies  . BUNIONECTOMY    . CERVICAL SPINE SURGERY    . COLONOSCOPY  2008   Dr. FieOneida Alarare sigmoid colon diverticulosis, internal hemorrhoids.   . COLONOSCOPY WITH PROPOFOL N/A 12/17/2016   dense left-sided diverticulosis, right colon ulcers s/p biopsy query occult NSAID use vs transient ischemia, not consistent with IBD. CMV stains negative.   . EMarland KitchenDARTERECTOMY FEMORAL Right 08/14/2017   Procedure: RIGHT ILLIO-FEMORAL ENDARTERECTOMY;  Surgeon: BraSerafina MitchellD;  [facility];  Service: Vascular;  Laterality: Right;  . ESOPHAGEAL DILATION  12/17/2016   EGD with mild Schatzki's ring s/p dilatation, small hiatal hernia, erosive gastropathy (negative H.pylori gastritis)  . ESOPHAGOGASTRODUODENOSCOPY  2008   Dr. FieOneida Alarormal esophagus without Barrett's, antritis and duodenitis, path with H.pylori gastritis  . ESOPHAGOGASTRODUODENOSCOPY (EGD) WITH PROPOFOL N/A 12/17/2016   Procedure: ESOPHAGOGASTRODUODENOSCOPY (EGD) WITH PROPOFOL;  Surgeon: RouDaneil DolinD;  [facility];  Service: Gastroenterology;  Laterality: N/A;  . ESOPHAGOGASTRODUODENOSCOPY (EGD) WITH PROPOFOL N/A 04/29/2017   Patchy erythema of gastric mucosa diffusely, extensive inflammatory  changes in duodenum, geographic ulceration and mucosal edema present, encroaching somewhat on the lumen yet still widely patent, distal second portion of duodenum appeared abnormal, path with peptic duodenitis with ulceration  . HERNIA REPAIR     umbilical  . INSERTION OF ILIAC STENT Right 08/14/2017   Procedure: INSERTION OF RIGHT COMMON ILIAC STENT 64m x 812mx 130cm INSERTION OF RIGHT EXTERNAL ILIAC STENT 92m21m 66m71m130cm INSERTION OF SUPERFICIAL FERMORAL ARTERY STENT 7mm 73m0mm 44m0cm;  Surgeon: BrabhaSerafina Mitchell  [facility];  Laterality: Right;  . laparoscopic inguinal hernia right  02/2017   [place]  . LOWER EXTREMITY ANGIOGRAPHY Right 08/11/2017   Procedure: LOWER EXTREMITY ANGIOGRAPHY;  Surgeon: BrabhaSerafina Mitchell [facility];  Service: Cardiovascular;  Laterality: Right;  . PATCH ANGIOPLASTY Right 08/14/2017   Procedure: PATCH ANGIOPLASTY USING HEMASHIELD PATCH 0.3IN X 6IN;Lillie Columbiageon: BrabhaSerafina Mitchell [facility];  Service: Vascular;  Laterality: Right;  . PERIPHERAL VASCULAR CATHETERIZATION N/A 09/20/2014   Procedure: Abdominal Aortogram;  Surgeon: Vance Serafina Mitchell [facility];  Service: Cardiovascular;  Laterality: N/A;   Social History:  reports that he quit smoking about 3 weeks ago. His smoking use included cigarettes. He has a 23.50 pack-year smoking history. He has quit using smokeless tobacco. His smokeless tobacco use included snuff and chew. He reports that he has current or past drug history. He reports that he does not drink alcohol. Allergies: No Known Allergies Family History  Problem Relation Age of Onset  . Alcoholism Mother   . Heart disease Father        Massive heart attack  . Heart attack Father   . Atrial fibrillation Father   . Colon cancer Father   . Colon cancer Maternal Grandfather 60  . 61coholism Maternal Grandfather   . Renal cancer Cousin   . Ovarian cancer Sister     Medications:  Prior to Admission:  Medications Prior to Admission  Medication Sig Dispense Refill Last Dose  . BREO ELLIPTA 200-25 MCG/INH AEPB Inhale 1 puff into the lungs daily.   08/23/2017 at Unknown time  . carvedilol (COREG) 25 MG tablet Take 1 tablet (25 mg total) 2 (two) times daily by mouth. 180 tablet 3 08/24/2017 at 630a  . cholecalciferol (VITAMIN D) 1000 units tablet Take 2,000 Units by mouth daily.   08/23/2017 at Unknown time  . clopidogrel (PLAVIX) 75 MG tablet Take 1 tablet (75 mg total) by mouth daily. 30  tablet 3 08/23/2017 at Unknown time  . doxazosin (CARDURA) 8 MG tablet Take 8 mg by mouth at bedtime.    08/23/2017 at Unknown time  . oxyCODONE-acetaminophen (PERCOCET/ROXICET) 5-325 MG tablet Take 1 tablet by mouth every 6 (six) hours as needed. (Patient taking differently: Take 1 tablet by mouth every 3 (three) hours as needed for moderate pain. ) 20 tablet 0 08/23/2017 at Unknown time  . torsemide (DEMADEX) 20 MG tablet Take 1 tablet (20 mg total) by mouth daily. 30 tablet 0 08/23/2017 at Unknown time  . pantoprazole (PROTONIX) 40 MG tablet Take 1 tablet (40 mg total) by mouth 2 (two) times daily. 60 tablet 1 08/14/2017 at 0630   Scheduled: . carvedilol  25 mg Oral BID  . cephALEXin  250 mg Oral Q12H  . cholecalciferol  2,000 Units Oral Daily  . clopidogrel  75 mg Oral Daily  . doxazosin  8 mg  Oral QHS  . fluticasone furoate-vilanterol  1 puff Inhalation Daily  . heparin  5,000 Units Subcutaneous Q8H  . mouth rinse  15 mL Mouth Rinse BID  . pantoprazole  40 mg Oral BID  . sodium polystyrene  30 g Oral Once   ROS: as per HPI  Blood pressure (!) 166/82, pulse 63, temperature 98.6 F (37 C), temperature source Oral, resp. rate 18, height '6\' 3"'  (1.905 m), weight 100 kg (220 lb 8 oz), SpO2 98 %.  General appearance: alert and cooperative Head: Normocephalic, without obvious abnormality, atraumatic Eyes: conjunctivae/corneas clear. PERRL, EOMI Ears: normal TM's and external ear canals both ears Resp: diminished breath sounds bilaterally and some wheezes Chest wall: no tenderness  GU scrotal and penile edema Cardio: regular rate and rhythm, S1, S2 normal, no murmur, click, rub or gallop GI: soft, non-tender; bowel sounds normal; no masses,  no organomegaly Extremities: edema 2+ RLE diffusely, 1+ LLE Skin: Skin color, texture, turgor normal. No rashes or lesions Neurologic: Grossly normal Results for orders placed or performed during the hospital encounter of 08/24/17 (from the past 48  hour(s))  Glucose, capillary     Status: Abnormal   Collection Time: 08/26/17  6:26 AM  Result Value Ref Range   Glucose-Capillary 111 (H) 65 - 99 mg/dL  Basic metabolic panel     Status: Abnormal   Collection Time: 08/26/17 12:00 PM  Result Value Ref Range   Sodium 135 135 - 145 mmol/L   Potassium 5.9 (H) 3.5 - 5.1 mmol/L   Chloride 98 (L) 101 - 111 mmol/L   CO2 25 22 - 32 mmol/L   Glucose, Bld 110 (H) 65 - 99 mg/dL   BUN 56 (H) 6 - 20 mg/dL   Creatinine, Ser 5.66 (H) 0.61 - 1.24 mg/dL   Calcium 7.7 (L) 8.9 - 10.3 mg/dL   GFR calc non Af Amer 10 (L) >60 mL/min   GFR calc Af Amer 11 (L) >60 mL/min    Comment: (NOTE) The eGFR has been calculated using the CKD EPI equation. This calculation has not been validated in all clinical situations. eGFR's persistently <60 mL/min signify possible Chronic Kidney Disease.    Anion gap 12 5 - 15    Comment: Performed at [facility] [address] [place], [place] [address]  Basic metabolic panel     Status: Abnormal   Collection Time: 08/27/17  4:05 AM  Result Value Ref Range   Sodium 136 135 - 145 mmol/L   Potassium 5.4 (H) 3.5 - 5.1 mmol/L   Chloride 99 (L) 101 - 111 mmol/L   CO2 25 22 - 32 mmol/L   Glucose, Bld 108 (H) 65 - 99 mg/dL   BUN 58 (H) 6 - 20 mg/dL   Creatinine, Ser 5.84 (H) 0.61 - 1.24 mg/dL   Calcium 7.6 (L) 8.9 - 10.3 mg/dL   GFR calc non Af Amer 9 (L) >60 mL/min   GFR calc Af Amer 11 (L) >60 mL/min    Comment: (NOTE) The eGFR has been calculated using the CKD EPI equation. This calculation has not been validated in all clinical situations. eGFR's persistently <60 mL/min signify possible Chronic Kidney Disease.    Anion gap 12 5 - 15    Comment: Performed at [facility] [address], [place], [place] [address]  Glucose, capillary     Status: Abnormal   Collection Time: 08/27/17  4:22 AM  Result Value Ref Range   Glucose-Capillary 109 (  H) 65 - 99 mg/dL   Ct Abdomen Pelvis Wo Contrast  Result  Date: 08/27/2017 CLINICAL DATA:  Right leg pain and scrotal swelling. EXAM: CT ABDOMEN AND PELVIS WITHOUT CONTRAST TECHNIQUE: Multidetector CT imaging of the abdomen and pelvis was performed following the standard protocol without IV contrast. COMPARISON:  CT scan of April 27, 2017. FINDINGS: Lower chest: Minimal right posterior basilar subsegmental atelectasis is noted. Hepatobiliary: No gallstones are noted. Stable multiple small hepatic cysts are noted. No biliary dilatation is noted. Pancreas: Unremarkable. No pancreatic ductal dilatation or surrounding inflammatory changes. Spleen: Normal in size without focal abnormality. Adrenals/Urinary Tract: Adrenal glands appear normal. Stable severe polycystic kidney disease is seen bilaterally. No hydronephrosis or renal obstruction is noted. No renal or ureteral calculi are noted. Urinary bladder is unremarkable. Stomach/Bowel: Stomach is within normal limits. Appendix appears normal. No evidence of bowel wall thickening, distention, or inflammatory changes. Sigmoid diverticulosis is noted without inflammation. Vascular/Lymphatic: Aortic atherosclerosis. No enlarged abdominal or pelvic lymph nodes. Stents are noted in the right common and external iliac arteries. Reproductive: Prostate is unremarkable. Other: Mild anasarca is noted. Large amount of fluid is noted in the scrotum. Musculoskeletal: Extensive postsurgical and degenerative changes are seen involving the lumbar spine. No acute abnormality is noted. IMPRESSION: Stable severe polycystic kidney disease is noted, with stable cysts seen throughout hepatic parenchyma most consistent with associated polycystic liver disease. Sigmoid diverticulosis without inflammation. Mild anasarca is noted. Large amount of fluid is noted in the scrotum. Aortic Atherosclerosis (ICD10-I70.0). Electronically Signed   By: Marijo Conception, M.D.   On: 08/27/2017 11:53   Ct Femur Right Wo Contrast  Result Date: 08/27/2017 CLINICAL  DATA:  Scrotal and right leg swelling. Recent right CFA endarterectomy and stenting 10 days ago. EXAM: CT OF THE LOWER RIGHT EXTREMITY WITHOUT CONTRAST TECHNIQUE: Multidetector CT imaging of the right lower extremity was performed according to the standard protocol. COMPARISON:  None. FINDINGS: Bones/Joint/Cartilage No acute fracture or dislocation. Severe osteoarthritis of the right hip joint. Ligaments Suboptimally assessed by CT. Muscles and Tendons Unremarkable. Soft tissues Right external iliac, right proximal SFA, and distal left SFA stents. Atherosclerotic vascular calcifications. Postsurgical changes in the right groin with small right inguinal fluid collection. Diffuse anasarca. Moderate right and mild left thigh circumferential soft tissue swelling. Severe scrotal and penile edema. IMPRESSION: 1. Postsurgical changes in the right groin with small right inguinal simple appearing fluid collection, favoring seroma. 2. Diffuse anasarca.  Severe scrotal edema. 3. Severe right hip osteoarthritis.  No acute osseous abnormality. Electronically Signed   By: Titus Dubin M.D.   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

## 2017-08-28 LAB — CBC WITH DIFFERENTIAL/PLATELET
ABS IMMATURE GRANULOCYTES: 0 10*3/uL (ref 0.0–0.1)
BASOS ABS: 0.1 10*3/uL (ref 0.0–0.1)
BASOS PCT: 1 %
Eosinophils Absolute: 0.2 10*3/uL (ref 0.0–0.7)
Eosinophils Relative: 3 %
HCT: 30.2 % — ABNORMAL LOW (ref 39.0–52.0)
HEMOGLOBIN: 9.6 g/dL — AB (ref 13.0–17.0)
IMMATURE GRANULOCYTES: 1 %
LYMPHS PCT: 12 %
Lymphs Abs: 0.8 10*3/uL (ref 0.7–4.0)
MCH: 32.1 pg (ref 26.0–34.0)
MCHC: 31.8 g/dL (ref 30.0–36.0)
MCV: 101 fL — ABNORMAL HIGH (ref 78.0–100.0)
Monocytes Absolute: 0.6 10*3/uL (ref 0.1–1.0)
Monocytes Relative: 8 %
NEUTROS ABS: 5.1 10*3/uL (ref 1.7–7.7)
NEUTROS PCT: 75 %
PLATELETS: 225 10*3/uL (ref 150–400)
RBC: 2.99 MIL/uL — ABNORMAL LOW (ref 4.22–5.81)
RDW: 14.2 % (ref 11.5–15.5)
WBC: 6.8 10*3/uL (ref 4.0–10.5)

## 2017-08-28 LAB — BASIC METABOLIC PANEL
ANION GAP: 12 (ref 5–15)
BUN: 62 mg/dL — ABNORMAL HIGH (ref 6–20)
CALCIUM: 7.9 mg/dL — AB (ref 8.9–10.3)
CO2: 25 mmol/L (ref 22–32)
Chloride: 98 mmol/L — ABNORMAL LOW (ref 101–111)
Creatinine, Ser: 5.82 mg/dL — ABNORMAL HIGH (ref 0.61–1.24)
GFR calc non Af Amer: 9 mL/min — ABNORMAL LOW (ref >60)
GFR, EST AFRICAN AMERICAN: 11 mL/min — AB (ref >60)
Glucose, Bld: 105 mg/dL — ABNORMAL HIGH (ref 65–99)
Potassium: 5.7 mmol/L — ABNORMAL HIGH (ref 3.5–5.1)
SODIUM: 135 mmol/L (ref 135–145)

## 2017-08-28 LAB — RENAL FUNCTION PANEL
ALBUMIN: 3.1 g/dL — AB (ref 3.5–5.0)
Anion gap: 14 (ref 5–15)
BUN: 66 mg/dL — AB (ref 6–20)
CALCIUM: 7.8 mg/dL — AB (ref 8.9–10.3)
CHLORIDE: 97 mmol/L — AB (ref 101–111)
CO2: 24 mmol/L (ref 22–32)
CREATININE: 6.46 mg/dL — AB (ref 0.61–1.24)
GFR calc Af Amer: 10 mL/min — ABNORMAL LOW (ref >60)
GFR, EST NON AFRICAN AMERICAN: 8 mL/min — AB (ref >60)
Glucose, Bld: 138 mg/dL — ABNORMAL HIGH (ref 65–99)
PHOSPHORUS: 9.1 mg/dL — AB (ref 2.5–4.6)
Potassium: 5.8 mmol/L — ABNORMAL HIGH (ref 3.5–5.1)
SODIUM: 135 mmol/L (ref 135–145)

## 2017-08-28 LAB — GLUCOSE, CAPILLARY: GLUCOSE-CAPILLARY: 127 mg/dL — AB (ref 65–99)

## 2017-08-28 MED ORDER — CARVEDILOL 12.5 MG PO TABS
12.5000 mg | ORAL_TABLET | Freq: Two times a day (BID) | ORAL | Status: DC
  Administered 2017-08-28 – 2017-09-05 (×15): 12.5 mg via ORAL
  Filled 2017-08-28 (×16): qty 1

## 2017-08-28 MED ORDER — PATIROMER SORBITEX CALCIUM 8.4 G PO PACK
8.4000 g | PACK | Freq: Two times a day (BID) | ORAL | Status: DC
  Administered 2017-08-29 (×2): 8.4 g via ORAL
  Filled 2017-08-28 (×6): qty 1

## 2017-08-28 MED ORDER — PATIROMER SORBITEX CALCIUM 8.4 G PO PACK
8.4000 g | PACK | Freq: Every day | ORAL | Status: DC
  Administered 2017-08-28: 8.4 g via ORAL
  Filled 2017-08-28: qty 1

## 2017-08-28 MED ORDER — FUROSEMIDE 10 MG/ML IJ SOLN
160.0000 mg | Freq: Four times a day (QID) | INTRAVENOUS | Status: DC
  Administered 2017-08-28 – 2017-09-04 (×27): 160 mg via INTRAVENOUS
  Filled 2017-08-28: qty 16
  Filled 2017-08-28: qty 10
  Filled 2017-08-28 (×3): qty 16
  Filled 2017-08-28: qty 10
  Filled 2017-08-28: qty 16
  Filled 2017-08-28 (×2): qty 10
  Filled 2017-08-28 (×3): qty 16
  Filled 2017-08-28 (×2): qty 10
  Filled 2017-08-28 (×3): qty 16
  Filled 2017-08-28: qty 4
  Filled 2017-08-28: qty 16
  Filled 2017-08-28: qty 10
  Filled 2017-08-28 (×2): qty 16
  Filled 2017-08-28: qty 10
  Filled 2017-08-28 (×2): qty 16
  Filled 2017-08-28: qty 10
  Filled 2017-08-28 (×2): qty 16
  Filled 2017-08-28 (×2): qty 10
  Filled 2017-08-28: qty 16

## 2017-08-28 NOTE — Plan of Care (Signed)

## 2017-08-28 NOTE — Progress Notes (Signed)
    Subjective  -   Pt resting this am   Physical Exam:  CT scan without obvious source or cause of scrotal edema       Assessment/Plan:    Continue with supportive care and diuresis  Wells Josemanuel Eakins 08/28/2017 8:39 AM --  Vitals:   08/27/17 2237 08/28/17 0342  BP: (!) 167/101 136/89  Pulse: 67 71  Resp:  19  Temp: (!) 97.4 F (36.3 C) 97.7 F (36.5 C)  SpO2: 99% 98%    Intake/Output Summary (Last 24 hours) at 08/28/2017 0839 Last data filed at 08/28/2017 0500 Gross per 24 hour  Intake 612 ml  Output -  Net 612 ml     Laboratory CBC    Component Value Date/Time   WBC 6.8 08/28/2017 0416   HGB 9.6 (L) 08/28/2017 0416   HCT 30.2 (L) 08/28/2017 0416   PLT 225 08/28/2017 0416    BMET    Component Value Date/Time   NA 135 08/28/2017 0416   K 5.7 (H) 08/28/2017 0416   CL 98 (L) 08/28/2017 0416   CO2 25 08/28/2017 0416   GLUCOSE 105 (H) 08/28/2017 0416   BUN 62 (H) 08/28/2017 0416   CREATININE 5.82 (H) 08/28/2017 0416   CALCIUM 7.9 (L) 08/28/2017 0416   GFRNONAA 9 (L) 08/28/2017 0416   GFRAA 11 (L) 08/28/2017 0416    COAG Lab Results  Component Value Date   INR 1.19 08/24/2017   INR 1.05 08/14/2017   INR 1.07 08/11/2017   No results found for: PTT  Antibiotics Anti-infectives (From admission, onward)   Start     Dose/Rate Route Frequency Ordered Stop   08/26/17 1300  cephALEXin (KEFLEX) capsule 250 mg     250 mg Oral Every 12 hours 08/26/17 1132     08/25/17 1000  ceFAZolin (ANCEF) IVPB 1 g/50 mL premix  Status:  Discontinued     1 g 100 mL/hr over 30 Minutes Intravenous Every 12 hours 08/24/17 2223 08/26/17 1132   08/24/17 1915  ceFAZolin (ANCEF) IVPB 1 g/50 mL premix     1 g 100 mL/hr over 30 Minutes Intravenous  Once 08/24/17 1911 08/24/17 2120       V. Leia Alf, M.D. [facility] Office: [phone] Pager:  [phone]

## 2017-08-28 NOTE — Progress Notes (Signed)
Subjective:  Uncomfortable "something needs to be done" says is making urine no urinary obs sxms  Objective Vital signs in last 24 hours: Vitals:   08/27/17 1534 08/27/17 2237 08/28/17 0342 08/28/17 0850  BP: (!) 166/82 (!) 167/101 136/89   Pulse: 63 67 71   Resp: 18  19   Temp: 98.6 F (37 C) (!) 97.4 F (36.3 C) 97.7 F (36.5 C)   TempSrc: Oral Oral Oral   SpO2: 98% 99% 98% 98%  Weight:   99.7 kg (219 lb 11.2 oz)   Height:       Weight change: -0.363 kg (-12.8 oz)  Intake/Output Summary (Last 24 hours) at 08/28/2017 1046 Last data filed at 08/28/2017 0500 Gross per 24 hour  Intake 612 ml  Output -  Net 612 ml    Assessment/ Plan: Pt is a 62 y.o. 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 Renal- advanced CKD at baseline- slight worsening in the setting of contrast from stenting- not uremic and no indications for HD 2. HTN/vol- volume overload- weight down and he says is making urine- responding to lasix but no UOP recorded.  Will inc lasix- is going to take some time to diurese enough for him to notice I am afraid  3. Anemia- not to critical point 4. Hyperkalemia- start veltassa- hopefully lasix will bring down as well    Juliahna Wiswell A    Labs: Basic Metabolic Panel: Recent Labs  Lab 08/26/17 1200 08/27/17 0405 08/28/17 0416  NA 135 136 135  K 5.9* 5.4* 5.7*  CL 98* 99* 98*  CO2 25 25 25   GLUCOSE 110* 108* 105*  BUN 56* 58* 62*  CREATININE 5.66* 5.84* 5.82*  CALCIUM 7.7* 7.6* 7.9*   Liver Function Tests: Recent Labs  Lab 08/24/17 1808  AST 17  ALT 7*  ALKPHOS 93  BILITOT 0.4  PROT 6.2*  ALBUMIN 2.9*   No results for input(s): LIPASE, AMYLASE in the last 168 hours. No results for input(s): AMMONIA in the last 168 hours. CBC: Recent Labs  Lab  08/24/17 1808 08/24/17 2253 08/28/17 0416  WBC 9.4 8.5 6.8  NEUTROABS 7.3 6.2 5.1  HGB 9.4* 10.0* 9.6*  HCT 29.3* 31.3* 30.2*  MCV 100.0 101.6* 101.0*  PLT 222 247 225   Cardiac Enzymes: No results for input(s): CKTOTAL, CKMB, CKMBINDEX, TROPONINI in the last 168 hours. CBG: Recent Labs  Lab 08/25/17 0615 08/26/17 0626 08/27/17 0422 08/28/17 0611  GLUCAP 140* 111* 109* 127*    Iron Studies: No results for input(s): IRON, TIBC, TRANSFERRIN, FERRITIN in the last 72 hours. Studies/Results: Ct Abdomen Pelvis Wo Contrast  Result Date: 08/27/2017 CLINICAL DATA:  Right leg pain and scrotal swelling. EXAM: CT ABDOMEN AND PELVIS WITHOUT CONTRAST TECHNIQUE: Multidetector CT imaging of the abdomen and pelvis was performed following the standard protocol without IV contrast. COMPARISON:  CT scan of April 27, 2017. FINDINGS: Lower chest: Minimal right posterior basilar subsegmental atelectasis is noted. Hepatobiliary: No gallstones are noted. Stable multiple small hepatic cysts are noted. No biliary dilatation is noted. Pancreas: Unremarkable. No pancreatic ductal dilatation or surrounding inflammatory changes. Spleen: Normal in size without focal abnormality. Adrenals/Urinary Tract: Adrenal glands appear normal. Stable severe polycystic kidney disease is seen bilaterally. No hydronephrosis or renal obstruction is noted. No renal or ureteral calculi are  noted. Urinary bladder is unremarkable. Stomach/Bowel: Stomach is within normal limits. Appendix appears normal. No evidence of bowel wall thickening, distention, or inflammatory changes. Sigmoid diverticulosis is noted without inflammation. Vascular/Lymphatic: Aortic atherosclerosis. No enlarged abdominal or pelvic lymph nodes. Stents are noted in the right common and external iliac arteries. Reproductive: Prostate is unremarkable. Other: Mild anasarca is noted. Large amount of fluid is noted in the scrotum. Musculoskeletal: Extensive postsurgical  and degenerative changes are seen involving the lumbar spine. No acute abnormality is noted. IMPRESSION: Stable severe polycystic kidney disease is noted, with stable cysts seen throughout hepatic parenchyma most consistent with associated polycystic liver disease. Sigmoid diverticulosis without inflammation. Mild anasarca is noted. Large amount of fluid is noted in the scrotum. Aortic Atherosclerosis (ICD10-I70.0). Electronically Signed   By: Marijo Conception, M.D.   On: 08/27/2017 11:53   Dg Chest 2 View  Result Date: 08/27/2017 CLINICAL DATA:  CHF. Shortness of breath, cough and testicular swelling EXAM: CHEST - 2 VIEW COMPARISON:  Chest x-rays dated 09/22/2014 and 03/01/2013 FINDINGS: Heart size and mediastinal contours are within normal limits. Lungs are hyperexpanded. Lungs are clear. No pleural effusion or pneumothorax seen. Osseous structures about the chest are unremarkable. IMPRESSION: 1. No active cardiopulmonary disease. No evidence of pneumonia or pulmonary edema. 2. Hyperexpanded lungs indicating COPD. Electronically Signed   By: Franki Cabot M.D.   On: 08/27/2017 20:02   Ct Femur Right Wo Contrast  Result Date: 08/27/2017 CLINICAL DATA:  Scrotal and right leg swelling. Recent right CFA endarterectomy and stenting 10 days ago. EXAM: CT OF THE LOWER RIGHT EXTREMITY WITHOUT CONTRAST TECHNIQUE: Multidetector CT imaging of the right lower extremity was performed according to the standard protocol. COMPARISON:  None. FINDINGS: Bones/Joint/Cartilage No acute fracture or dislocation. Severe osteoarthritis of the right hip joint. Ligaments Suboptimally assessed by CT. Muscles and Tendons Unremarkable. Soft tissues Right external iliac, right proximal SFA, and distal left SFA stents. Atherosclerotic vascular calcifications. Postsurgical changes in the right groin with small right inguinal fluid collection. Diffuse anasarca. Moderate right and mild left thigh circumferential soft tissue swelling. Severe  scrotal and penile edema. IMPRESSION: 1. Postsurgical changes in the right groin with small right inguinal simple appearing fluid collection, favoring seroma. 2. Diffuse anasarca.  Severe scrotal edema. 3. Severe right hip osteoarthritis.  No acute osseous abnormality. Electronically Signed   By: Titus Dubin M.D.   On: 08/27/2017 12:07   Medications: Infusions: . furosemide 160 mg (08/28/17 0931)    Scheduled Medications: . carvedilol  25 mg Oral BID  . cephALEXin  250 mg Oral Q12H  . cholecalciferol  2,000 Units Oral Daily  . clopidogrel  75 mg Oral Daily  . doxazosin  8 mg Oral QHS  . fluticasone furoate-vilanterol  1 puff Inhalation Daily  . heparin  5,000 Units Subcutaneous Q8H  . mouth rinse  15 mL Mouth Rinse BID  . pantoprazole  40 mg Oral BID  . sodium polystyrene  30 g Oral Once    have reviewed scheduled and prn medications.  Physical Exam: General: sitting up in bed- uncomfortable Heart: RRR Lungs: dec BS at bases - not hypoxic  Abdomen: distended, non tender Extremities: evidence of previous surg- incision in groin- diffuse edema throughout lower body    08/28/2017,10:46 AM  LOS: 4 days

## 2017-08-28 NOTE — Progress Notes (Signed)
PROGRESS NOTE    Aaron [name]  LYY:[id number] DOB: 11/21/1955 DOA: 08/24/2017 PCP: Gwenlyn Saran [place]   Brief Narrative: 62 y.o.M with anxiety, osteoarthritis of knees, back and shoulders, CAD (mild nonobstructive disease in 2002), chronic back pain, stageVCKD, COPD, polycystic kidney disease, diverticulosis, GERD, hypertension, history of treated hep C, history of urolithiasis, hyperlipidemia, paroxysmal atrial tachycardia, history of syncopal episodes, peripheral arterial disease who is 10 days post-op from abdominal aortogram with lower extremity angiography, which was uneventful until this morning when he woke up with right groin pain and noticed right groin area and genitalia edema.       Assessment & Plan:   Principal Problem:   Groin swelling Active Problems:   Essential hypertension   Esophageal reflux   SYNCOPE   PAD (peripheral artery disease) (HCC)   COPD (chronic obstructive pulmonary disease) (HCC)   Paroxysmal atrial fibrillation (HCC)   Hyperlipidemia Polycystic kidney disease Chronic kidney disease stage V, not on HD Baseline Cr 4.8-5.5.  Patient started on high-dose Lasix 160 mg every 6 today.  Patient still having increased swelling in his body and hand and scrotum.  Hyperkalemia being treated with Veltassa.  Daily BMP.  Nephrology following.   Hyperkalemia being treated by renal.  Scrotal edema He has edema of the right leg, groin, and some abdomen swelling. Seems out of proportion to what would be expected after surgery. I favor that this is systemic fluid overload (I.e. CHF, from his CKD V) rather than compression of a vein or lymphatic.  CT without contrast 08/27/2017 . Paroxysmal atrial fibrillation -Continue carvedilol -Defer restarting warfarin to vascular surgery  Hypertension Hypertensive -Continue carvedilol and doxazosin  Syncope, orthostasis Echo and dopplers negative. Symptoms are orthostatic. He is still  somewhat       DVT prophylaxis: Heparin Code Status full code Family Communication: No family available Disposition Plan: TBD Consultants:   Nephrology, vascular surgery  Procedures: None Antimicrobials: None Subjective: Very frustrated about this scrotal swelling and penile swelling and the whole body swelling up reports he is able to be but the swelling is not coming down.  Objective: Vitals:   08/27/17 1534 08/27/17 2237 08/28/17 0342 08/28/17 0850  BP: (!) 166/82 (!) 167/101 136/89   Pulse: 63 67 71   Resp: 18  19   Temp: 98.6 F (37 C) (!) 97.4 F (36.3 C) 97.7 F (36.5 C)   TempSrc: Oral Oral Oral   SpO2: 98% 99% 98% 98%  Weight:   99.7 kg (219 lb 11.2 oz)   Height:        Intake/Output Summary (Last 24 hours) at 08/28/2017 1145 Last data filed at 08/28/2017 0500 Gross per 24 hour  Intake 612 ml  Output -  Net 612 ml   Filed Weights   08/26/17 0515 08/27/17 0428 08/28/17 0342  Weight: 102.2 kg (225 lb 5 oz) 100 kg (220 lb 8 oz) 99.7 kg (219 lb 11.2 oz)    Examination:  General exam: Appears calm and comfortable  Respiratory system: Clear to auscultation. Respiratory effort normal. Cardiovascular system: S1 & S2 heard, RRR. No JVD, murmurs, rubs, gallops or clicks. No pedal edema. Gastrointestinal system: Abdomen is nondistended, soft and nontender. No organomegaly or masses felt. Normal bowel sounds heard. Central nervous system: Alert and oriented. No focal neurological deficits. Extremities: Symmetric 5 x 5 power. Skin: No rashes, lesions or ulcers Psychiatry: Judgement and insight appear normal. Mood & affect appropriate.     Data Reviewed: I have personally reviewed  following labs and imaging studies  CBC: Recent Labs  Lab 08/24/17 1808 08/24/17 2253 08/28/17 0416  WBC 9.4 8.5 6.8  NEUTROABS 7.3 6.2 5.1  HGB 9.4* 10.0* 9.6*  HCT 29.3* 31.3* 30.2*  MCV 100.0 101.6* 101.0*  PLT 222 247 425   Basic Metabolic Panel: Recent Labs  Lab  08/24/17 1808 08/26/17 1200 08/27/17 0405 08/28/17 0416  NA 138 135 136 135  K 4.6 5.9* 5.4* 5.7*  CL 103 98* 99* 98*  CO2 24 25 25 25   GLUCOSE 110* 110* 108* 105*  BUN 54* 56* 58* 62*  CREATININE 5.19* 5.66* 5.84* 5.82*  CALCIUM 7.8* 7.7* 7.6* 7.9*   GFR: Estimated Creatinine Clearance: 15.9 mL/min (A) (by C-G formula based on SCr of 5.82 mg/dL (H)). Liver Function Tests: Recent Labs  Lab 08/24/17 1808  AST 17  ALT 7*  ALKPHOS 93  BILITOT 0.4  PROT 6.2*  ALBUMIN 2.9*   No results for input(s): LIPASE, AMYLASE in the last 168 hours. No results for input(s): AMMONIA in the last 168 hours. Coagulation Profile: Recent Labs  Lab 08/24/17 1808  INR 1.19   Cardiac Enzymes: No results for input(s): CKTOTAL, CKMB, CKMBINDEX, TROPONINI in the last 168 hours. BNP (last 3 results) No results for input(s): PROBNP in the last 8760 hours. HbA1C: No results for input(s): HGBA1C in the last 72 hours. CBG: Recent Labs  Lab 08/25/17 0615 08/26/17 0626 08/27/17 0422 08/28/17 0611  GLUCAP 140* 111* 109* 127*   Lipid Profile: No results for input(s): CHOL, HDL, LDLCALC, TRIG, CHOLHDL, LDLDIRECT in the last 72 hours. Thyroid Function Tests: No results for input(s): TSH, T4TOTAL, FREET4, T3FREE, THYROIDAB in the last 72 hours. Anemia Panel: No results for input(s): VITAMINB12, FOLATE, FERRITIN, TIBC, IRON, RETICCTPCT in the last 72 hours. Sepsis Labs: No results for input(s): PROCALCITON, LATICACIDVEN in the last 168 hours.  No results found for this or any previous visit (from the past 240 hour(s)).       Radiology Studies: Ct Abdomen Pelvis Wo Contrast  Result Date: 08/27/2017 CLINICAL DATA:  Right leg pain and scrotal swelling. EXAM: CT ABDOMEN AND PELVIS WITHOUT CONTRAST TECHNIQUE: Multidetector CT imaging of the abdomen and pelvis was performed following the standard protocol without IV contrast. COMPARISON:  CT scan of April 27, 2017. FINDINGS: Lower chest:  Minimal right posterior basilar subsegmental atelectasis is noted. Hepatobiliary: No gallstones are noted. Stable multiple small hepatic cysts are noted. No biliary dilatation is noted. Pancreas: Unremarkable. No pancreatic ductal dilatation or surrounding inflammatory changes. Spleen: Normal in size without focal abnormality. Adrenals/Urinary Tract: Adrenal glands appear normal. Stable severe polycystic kidney disease is seen bilaterally. No hydronephrosis or renal obstruction is noted. No renal or ureteral calculi are noted. Urinary bladder is unremarkable. Stomach/Bowel: Stomach is within normal limits. Appendix appears normal. No evidence of bowel wall thickening, distention, or inflammatory changes. Sigmoid diverticulosis is noted without inflammation. Vascular/Lymphatic: Aortic atherosclerosis. No enlarged abdominal or pelvic lymph nodes. Stents are noted in the right common and external iliac arteries. Reproductive: Prostate is unremarkable. Other: Mild anasarca is noted. Large amount of fluid is noted in the scrotum. Musculoskeletal: Extensive postsurgical and degenerative changes are seen involving the lumbar spine. No acute abnormality is noted. IMPRESSION: Stable severe polycystic kidney disease is noted, with stable cysts seen throughout hepatic parenchyma most consistent with associated polycystic liver disease. Sigmoid diverticulosis without inflammation. Mild anasarca is noted. Large amount of fluid is noted in the scrotum. Aortic Atherosclerosis (ICD10-I70.0). Electronically Signed   By:  Marijo Conception, M.D.   On: 08/27/2017 11:53   Dg Chest 2 View  Result Date: 08/27/2017 CLINICAL DATA:  CHF. Shortness of breath, cough and testicular swelling EXAM: CHEST - 2 VIEW COMPARISON:  Chest x-rays dated 09/22/2014 and 03/01/2013 FINDINGS: Heart size and mediastinal contours are within normal limits. Lungs are hyperexpanded. Lungs are clear. No pleural effusion or pneumothorax seen. Osseous structures  about the chest are unremarkable. IMPRESSION: 1. No active cardiopulmonary disease. No evidence of pneumonia or pulmonary edema. 2. Hyperexpanded lungs indicating COPD. Electronically Signed   By: Franki Cabot M.D.   On: 08/27/2017 20:02   Ct Femur Right Wo Contrast  Result Date: 08/27/2017 CLINICAL DATA:  Scrotal and right leg swelling. Recent right CFA endarterectomy and stenting 10 days ago. EXAM: CT OF THE LOWER RIGHT EXTREMITY WITHOUT CONTRAST TECHNIQUE: Multidetector CT imaging of the right lower extremity was performed according to the standard protocol. COMPARISON:  None. FINDINGS: Bones/Joint/Cartilage No acute fracture or dislocation. Severe osteoarthritis of the right hip joint. Ligaments Suboptimally assessed by CT. Muscles and Tendons Unremarkable. Soft tissues Right external iliac, right proximal SFA, and distal left SFA stents. Atherosclerotic vascular calcifications. Postsurgical changes in the right groin with small right inguinal fluid collection. Diffuse anasarca. Moderate right and mild left thigh circumferential soft tissue swelling. Severe scrotal and penile edema. IMPRESSION: 1. Postsurgical changes in the right groin with small right inguinal simple appearing fluid collection, favoring seroma. 2. Diffuse anasarca.  Severe scrotal edema. 3. Severe right hip osteoarthritis.  No acute osseous abnormality. Electronically Signed   By: Titus Dubin M.D.   On: 08/27/2017 12:07        Scheduled Meds: . carvedilol  12.5 mg Oral BID  . cephALEXin  250 mg Oral Q12H  . cholecalciferol  2,000 Units Oral Daily  . clopidogrel  75 mg Oral Daily  . fluticasone furoate-vilanterol  1 puff Inhalation Daily  . heparin  5,000 Units Subcutaneous Q8H  . mouth rinse  15 mL Mouth Rinse BID  . pantoprazole  40 mg Oral BID  . patiromer  8.4 g Oral Daily   Continuous Infusions: . furosemide       LOS: 4 days     Georgette Shell, MD [facility]  If 7PM-7AM, please contact  night-coverage [email or web address] Password [email or web address] 08/28/2017, 11:45 AM

## 2017-08-29 LAB — GLUCOSE, CAPILLARY: Glucose-Capillary: 106 mg/dL — ABNORMAL HIGH (ref 65–99)

## 2017-08-29 LAB — BASIC METABOLIC PANEL
Anion gap: 15 (ref 5–15)
BUN: 67 mg/dL — ABNORMAL HIGH (ref 6–20)
CO2: 25 mmol/L (ref 22–32)
Calcium: 7.8 mg/dL — ABNORMAL LOW (ref 8.9–10.3)
Chloride: 95 mmol/L — ABNORMAL LOW (ref 101–111)
Creatinine, Ser: 5.92 mg/dL — ABNORMAL HIGH (ref 0.61–1.24)
GFR calc Af Amer: 11 mL/min — ABNORMAL LOW (ref >60)
GFR calc non Af Amer: 9 mL/min — ABNORMAL LOW (ref >60)
Glucose, Bld: 94 mg/dL (ref 65–99)
Potassium: 5.5 mmol/L — ABNORMAL HIGH (ref 3.5–5.1)
Sodium: 135 mmol/L (ref 135–145)

## 2017-08-29 MED ORDER — WARFARIN SODIUM 2.5 MG PO TABS: 2.5000 mg | ORAL_TABLET | Freq: Once | ORAL | Status: DC

## 2017-08-29 MED ORDER — METOLAZONE 5 MG PO TABS
5.0000 mg | ORAL_TABLET | Freq: Every day | ORAL | Status: DC
  Administered 2017-08-29 – 2017-09-03 (×6): 5 mg via ORAL
  Filled 2017-08-29 (×6): qty 1

## 2017-08-29 MED ORDER — WARFARIN - PHARMACIST DOSING INPATIENT
Freq: Every day | Status: DC
  Administered 2017-09-02: 17:00:00

## 2017-08-29 NOTE — Progress Notes (Signed)
R forearm IV site discontinued and patient had large amount of bleeding as well as bleeding from Heparin injection site throughout the afternoon and from small skin tear on arm. Notified pharmacy w concern for Warfarin dosing a dose held for today.

## 2017-08-29 NOTE — Progress Notes (Signed)
Peripheral IV site to R forearm failed and this is 4th failed IV since Tuesday. Patient needs good IV access for Lasix infusions. Spoke to Dr Rodena Piety who states she hesitates to place PICC or midline due to probable HD for Monday. IV team consult for peripheral line.

## 2017-08-29 NOTE — Progress Notes (Signed)
Patient ID: Aaron [name], male   DOB: 1955-09-09, 62 y.o.   MRN: [id number] No improvement in scrotal and penile edema.  Having worsening bilateral lower extremity edema as well.  Also reports fullness in his abdominal wall.  Right groin incision is healing nicely.  Unclear as to the etiology of his edema but this appears to be more systemic than local.  Is taking a great deal of pain meds for relief.  Also reports that he is unable to sleep.  Requesting something for sleep.  Will defer to primary service

## 2017-08-29 NOTE — Progress Notes (Signed)
Subjective:  Only 652 of UOP recorded- weight down 1 kg- kidney numbers the same  - he says making urine just cannot collect due to penile edema Objective Vital signs in last 24 hours: Vitals:   08/28/17 1734 08/28/17 2014 08/29/17 0352 08/29/17 0858  BP: (!) 145/93 (!) 156/89 134/82   Pulse: 64 (!) 33 95   Resp: 18 15 14    Temp: (!) 97.5 F (36.4 C) 97.6 F (36.4 C) 98.2 F (36.8 C)   TempSrc: Oral Oral Oral   SpO2: 97% 93% 95% 97%  Weight:   98.7 kg (217 lb 11.2 oz)   Height:       Weight change: -0.907 kg (-2 lb)  Intake/Output Summary (Last 24 hours) at 08/29/2017 1007 Last data filed at 08/29/2017 0352 Gross per 24 hour  Intake 1092 ml  Output 652 ml  Net 440 ml    Assessment/ Plan: Pt is a 62 y.o. 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 Renal- advanced CKD at baseline- crt 5 - slight worsening in the setting of contrast from stenting- not uremic and no indications for HD but volume is an issue.  Kidney numbers stable to slightly better- I think should try to diurese over weekend and if no signif improvement or worsening uremia may need to do dialysis- pt in agreement with that plan- sister informed as well  2. HTN/vol- volume overload- weight down and he says is making urine- responding to lasix but min UOP recorded.  Have inc lasix to max 160 q 6, will add metolazone as well-  is going to take some time to diurese enough for him to notice I am afraid - cont to attempt strict I's and O's and daily weights 3. Anemia- not to critical point 4. Hyperkalemia- start veltassa- hopefully lasix will bring down as well - slow to improve but not critical    Noya Santarelli A    Labs: Basic Metabolic Panel: Recent Labs  Lab 08/28/17 0416 08/28/17 1443 08/29/17 0331  NA 135 135 135  K  5.7* 5.8* 5.5*  CL 98* 97* 95*  CO2 25 24 25   GLUCOSE 105* 138* 94  BUN 62* 66* 67*  CREATININE 5.82* 6.46* 5.92*  CALCIUM 7.9* 7.8* 7.8*  PHOS  --  9.1*  --    Liver Function Tests: Recent Labs  Lab 08/24/17 1808 08/28/17 1443  AST 17  --   ALT 7*  --   ALKPHOS 93  --   BILITOT 0.4  --   PROT 6.2*  --   ALBUMIN 2.9* 3.1*   No results for input(s): LIPASE, AMYLASE in the last 168 hours. No results for input(s): AMMONIA in the last 168 hours. CBC: Recent Labs  Lab 08/24/17 1808 08/24/17 2253 08/28/17 0416  WBC 9.4 8.5 6.8  NEUTROABS 7.3 6.2 5.1  HGB 9.4* 10.0* 9.6*  HCT 29.3* 31.3* 30.2*  MCV 100.0 101.6* 101.0*  PLT 222 247 225   Cardiac Enzymes: No results for input(s): CKTOTAL, CKMB, CKMBINDEX, TROPONINI in the last 168 hours. CBG: Recent Labs  Lab 08/25/17 0615 08/26/17 0626 08/27/17 0422 08/28/17 0611 08/29/17 0631  GLUCAP 140* 111* 109* 127* 106*    Iron Studies: No results for input(s): IRON, TIBC, TRANSFERRIN, FERRITIN in the last 72 hours. Studies/Results: Ct Abdomen Pelvis Wo Contrast  Result Date: 08/27/2017 CLINICAL DATA:  Right leg pain and scrotal swelling. EXAM: CT ABDOMEN AND PELVIS WITHOUT CONTRAST TECHNIQUE: Multidetector CT imaging of the abdomen and pelvis was performed following the standard protocol without IV contrast. COMPARISON:  CT scan of April 27, 2017. FINDINGS: Lower chest: Minimal right posterior basilar subsegmental atelectasis is noted. Hepatobiliary: No gallstones are noted. Stable multiple small hepatic cysts are noted. No biliary dilatation is noted. Pancreas: Unremarkable. No pancreatic ductal dilatation or surrounding inflammatory changes. Spleen: Normal in size without focal abnormality. Adrenals/Urinary Tract: Adrenal glands appear normal. Stable severe polycystic kidney disease is seen bilaterally. No hydronephrosis or renal obstruction is noted. No renal or ureteral calculi are noted. Urinary bladder is unremarkable.  Stomach/Bowel: Stomach is within normal limits. Appendix appears normal. No evidence of bowel wall thickening, distention, or inflammatory changes. Sigmoid diverticulosis is noted without inflammation. Vascular/Lymphatic: Aortic atherosclerosis. No enlarged abdominal or pelvic lymph nodes. Stents are noted in the right common and external iliac arteries. Reproductive: Prostate is unremarkable. Other: Mild anasarca is noted. Large amount of fluid is noted in the scrotum. Musculoskeletal: Extensive postsurgical and degenerative changes are seen involving the lumbar spine. No acute abnormality is noted. IMPRESSION: Stable severe polycystic kidney disease is noted, with stable cysts seen throughout hepatic parenchyma most consistent with associated polycystic liver disease. Sigmoid diverticulosis without inflammation. Mild anasarca is noted. Large amount of fluid is noted in the scrotum. Aortic Atherosclerosis (ICD10-I70.0). Electronically Signed   By: Marijo Conception, M.D.   On: 08/27/2017 11:53   Dg Chest 2 View  Result Date: 08/27/2017 CLINICAL DATA:  CHF. Shortness of breath, cough and testicular swelling EXAM: CHEST - 2 VIEW COMPARISON:  Chest x-rays dated 09/22/2014 and 03/01/2013 FINDINGS: Heart size and mediastinal contours are within normal limits. Lungs are hyperexpanded. Lungs are clear. No pleural effusion or pneumothorax seen. Osseous structures about the chest are unremarkable. IMPRESSION: 1. No active cardiopulmonary disease. No evidence of pneumonia or pulmonary edema. 2. Hyperexpanded lungs indicating COPD. Electronically Signed   By: Franki Cabot M.D.   On: 08/27/2017 20:02   Ct Femur Right Wo Contrast  Result Date: 08/27/2017 CLINICAL DATA:  Scrotal and right leg swelling. Recent right CFA endarterectomy and stenting 10 days ago. EXAM: CT OF THE LOWER RIGHT EXTREMITY WITHOUT CONTRAST TECHNIQUE: Multidetector CT imaging of the right lower extremity was performed according to the standard  protocol. COMPARISON:  None. FINDINGS: Bones/Joint/Cartilage No acute fracture or dislocation. Severe osteoarthritis of the right hip joint. Ligaments Suboptimally assessed by CT. Muscles and Tendons Unremarkable. Soft tissues Right external iliac, right proximal SFA, and distal left SFA stents. Atherosclerotic vascular calcifications. Postsurgical changes in the right groin with small right inguinal fluid collection. Diffuse anasarca. Moderate right and mild left thigh circumferential soft tissue swelling. Severe scrotal and penile edema. IMPRESSION: 1. Postsurgical changes in the right groin with small right inguinal simple appearing fluid collection, favoring seroma. 2. Diffuse anasarca.  Severe scrotal edema. 3. Severe right hip osteoarthritis.  No acute osseous abnormality. Electronically Signed   By: Titus Dubin M.D.   On: 08/27/2017 12:07   Medications: Infusions: . furosemide 160 mg (08/29/17 3419)    Scheduled Medications: . carvedilol  12.5 mg Oral BID  . cephALEXin  250 mg Oral Q12H  . cholecalciferol  2,000 Units Oral Daily  . clopidogrel  75 mg Oral Daily  . fluticasone furoate-vilanterol  1 puff Inhalation Daily  . heparin  5,000 Units Subcutaneous Q8H  . mouth rinse  15 mL  Mouth Rinse BID  . pantoprazole  40 mg Oral BID  . patiromer  8.4 g Oral BID    have reviewed scheduled and prn medications.  Physical Exam: General: walking around room- c/o breakfast quality  Heart: RRR Lungs: dec BS at bases - not hypoxic  Abdomen: distended, non tender Extremities: evidence of previous surg- incision in groin- diffuse edema throughout lower body    08/29/2017,10:07 AM  LOS: 5 days

## 2017-08-29 NOTE — Progress Notes (Signed)
ANTICOAGULATION CONSULT NOTE - Initial Consult  Pharmacy Consult for warfarin Indication: atrial fibrillation  No Known Allergies  Patient Measurements: Height: 6\' 3"  (190.5 cm) Weight: 217 lb 11.2 oz (98.7 kg) IBW/kg (Calculated) : 84.5  Vital Signs: Temp: 98.2 F (36.8 C) (05/18 0352) Temp Source: Oral (05/18 0352) BP: 134/82 (05/18 0352) Pulse Rate: 95 (05/18 0352)  Labs: Recent Labs    08/28/17 0416 08/28/17 1443 08/29/17 0331  HGB 9.6*  --   --   HCT 30.2*  --   --   PLT 225  --   --   CREATININE 5.82* 6.46* 5.92*    Estimated Creatinine Clearance: 15.7 mL/min (A) (by C-G formula based on SCr of 5.92 mg/dL (H)).   Medical History: Past Medical History:  Diagnosis Date  . Anxiety   . Arthritis    knees , back , shoulders  . CAD (coronary artery disease)    Mild nonobstructive disease at cardiac catheterization 2002  . Chronic back pain   . CKD (chronic kidney disease), stage II   . COPD (chronic obstructive pulmonary disease) ([place])   . Diverticulitis   . Esophageal reflux   . Essential hypertension   . Hepatitis C    states he no longer has this  . History of kidney stones   . History of syncope   . Hyperlipidemia   . Nephrolithiasis   . PAT (paroxysmal atrial tachycardia) ([place])   . Peripheral arterial disease (HCC)    Occluded left superficial femoral artery status post stent June 2016 - Dr. Trula Slade  . Pneumonia    as child   30 years old  . Polycystic kidney, unspecified type   . Syncope 09/2014    Medications:  Medications Prior to Admission  Medication Sig Dispense Refill Last Dose  . BREO ELLIPTA 200-25 MCG/INH AEPB Inhale 1 puff into the lungs daily.   08/23/2017 at Unknown time  . carvedilol (COREG) 25 MG tablet Take 1 tablet (25 mg total) 2 (two) times daily by mouth. 180 tablet 3 08/24/2017 at 630a  . cholecalciferol (VITAMIN D) 1000 units tablet Take 2,000 Units by mouth daily.   08/23/2017 at Unknown time  . clopidogrel (PLAVIX) 75 MG  tablet Take 1 tablet (75 mg total) by mouth daily. 30 tablet 3 08/23/2017 at Unknown time  . doxazosin (CARDURA) 8 MG tablet Take 8 mg by mouth at bedtime.    08/23/2017 at Unknown time  . oxyCODONE-acetaminophen (PERCOCET/ROXICET) 5-325 MG tablet Take 1 tablet by mouth every 6 (six) hours as needed. (Patient taking differently: Take 1 tablet by mouth every 3 (three) hours as needed for moderate pain. ) 20 tablet 0 08/23/2017 at Unknown time  . torsemide (DEMADEX) 20 MG tablet Take 1 tablet (20 mg total) by mouth daily. 30 tablet 0 08/23/2017 at Unknown time  . pantoprazole (PROTONIX) 40 MG tablet Take 1 tablet (40 mg total) by mouth 2 (two) times daily. 60 tablet 1 08/14/2017 at 0630    Assessment: 62 y.o.M PMH of afib on warfarin PTA presents with right groin pain and noticed right groin area and genitalia edema. warfarin has been on hold during this admit for recent abdominal aortogram with most recent INR 5/13 = 1.19. There was initial concern for hematoma, but after discussion with provider, she feels the swelling is general edema from fluid overload. Patient has been receiving Lasix challenges and the swelling is improving over the past several days. The primary team is concerned about the patient being off  anticoagualtion for an extended period and has asked pharmacy to resume warfarin at a low dose and slowly titrate back to therapeutic INR. HgB 9.6, PLT WNL  PTA warfarin dose: 2.5 mg every Sun, Thu; 5 mg all other days    Goal of Therapy:  INR 2-3 Monitor platelets by anticoagulation protocol: Yes   Plan:  Warfarin 2.5mg  x1 tonight Daily INR/CBC Monitor for s/sx of bleeding  Deborah Lazcano L Aneisha Skyles 08/29/2017,10:07 AM

## 2017-08-29 NOTE — Progress Notes (Signed)
PROGRESS NOTE    Aaron [name]  RFF:[id number] DOB: 1956/04/13 DOA: 08/24/2017 PCP: Gwenlyn Saran [place]  Brief Narrative:  62 y.o.M with anxiety, osteoarthritis of knees, back and shoulders, CAD (mild nonobstructive disease in 2002), chronic back pain, stageVCKD, COPD, polycystic kidney disease, diverticulosis, GERD, hypertension, history of treated hep C, history of urolithiasis, hyperlipidemia, paroxysmal atrial tachycardia, history of syncopal episodes, peripheral arterial disease who is 10 days post-op from abdominal aortogram with lower extremity angiography, which was uneventful until this morning when he woke up with right groin pain and noticed right groin area and genitalia edema.       Assessment & Plan:   Principal Problem:   Groin swelling Active Problems:   Essential hypertension   Esophageal reflux   SYNCOPE   PAD (peripheral artery disease) (HCC)   COPD (chronic obstructive pulmonary disease) (HCC)   Paroxysmal atrial fibrillation (HCC)   Hyperlipidemia  1] polycystic kidney disease with advanced CKD patient currently on Lasix-160 mg every 6, metolazone added today.  Nephrology following.  They would like to continue this Lasix and metolazone over the weekend to see if there is any improvement in his edema or if there is worsening uremia patient will need dialysis early next week.  2] scrotal edema/penile edema-she is very frustrated over this situation.  The penile edema has gotten worse today compared to yesterday.  He is unable to collect a urine because of the edema.  CT scan showed postsurgical changes in the right groin with small right inguinal simple appearing fluid collection favoring seroma.  Diffuse anasarca, severe scrotal edema, severe right hip osteoarthritis. CT of the abdomen and pelvis showed stable severe polycystic kidney disease, large amount of fluid in the scrotum.  An ultrasound of the pelvis was also done which basically did  not reveal anything acute.  3] paroxysmal atrial fibrillation continue carvedilol.  Restart Coumadin at a lower dose discussed with pharmacy.  Coumadin was on hold all this time in view of maybe he needs surgery in the near future.  4] right severe peripheral vascular disease-that is post femoral-popliteal bypass 08/14/2017.  DVT prophylaxis: Coumadin started today 08/29/2017 until then he was on subcu heparin. Code Status: Full code Family Communication: No family available Disposition Plan: TBD  Consultants: Vascular, nephrology   Procedures: None Antimicrobials: None  Subjective: Frustrated about fluid overload and swelling in his body scrotum and penis.   Objective: Vitals:   08/28/17 1734 08/28/17 2014 08/29/17 0352 08/29/17 0858  BP: (!) 145/93 (!) 156/89 134/82   Pulse: 64 (!) 33 95   Resp: 18 15 14    Temp: (!) 97.5 F (36.4 C) 97.6 F (36.4 C) 98.2 F (36.8 C)   TempSrc: Oral Oral Oral   SpO2: 97% 93% 95% 97%  Weight:   98.7 kg (217 lb 11.2 oz)   Height:        Intake/Output Summary (Last 24 hours) at 08/29/2017 1223 Last data filed at 08/29/2017 0352 Gross per 24 hour  Intake 1092 ml  Output 650 ml  Net 442 ml   Filed Weights   08/27/17 0428 08/28/17 0342 08/29/17 0352  Weight: 100 kg (220 lb 8 oz) 99.7 kg (219 lb 11.2 oz) 98.7 kg (217 lb 11.2 oz)    Examination:  General exam: Appears calm and comfortable  Respiratory system: decreased breath sounds  auscultation. Respiratory effort normal. Cardiovascular system: S1 & S2 heard, RRR. No JVD, murmurs, rubs, gallops or clicks. No pedal edema. Gastrointestinal system: Abdomen is  distended, soft and nontender. No organomegaly or masses felt. Normal bowel sounds heard. Central nervous system: Alert and oriented. No focal neurological deficits. Extremities:2 plus edema scrotal and penile edema increased Skin: No rashes, lesions or ulcers Psychiatry: Judgement and insight appear normal. Mood & affect appropriate.       Data Reviewed: I have personally reviewed following labs and imaging studies  CBC: Recent Labs  Lab 08/24/17 1808 08/24/17 2253 08/28/17 0416  WBC 9.4 8.5 6.8  NEUTROABS 7.3 6.2 5.1  HGB 9.4* 10.0* 9.6*  HCT 29.3* 31.3* 30.2*  MCV 100.0 101.6* 101.0*  PLT 222 247 814   Basic Metabolic Panel: Recent Labs  Lab 08/26/17 1200 08/27/17 0405 08/28/17 0416 08/28/17 1443 08/29/17 0331  NA 135 136 135 135 135  K 5.9* 5.4* 5.7* 5.8* 5.5*  CL 98* 99* 98* 97* 95*  CO2 25 25 25 24 25   GLUCOSE 110* 108* 105* 138* 94  BUN 56* 58* 62* 66* 67*  CREATININE 5.66* 5.84* 5.82* 6.46* 5.92*  CALCIUM 7.7* 7.6* 7.9* 7.8* 7.8*  PHOS  --   --   --  9.1*  --    GFR: Estimated Creatinine Clearance: 15.7 mL/min (A) (by C-G formula based on SCr of 5.92 mg/dL (H)). Liver Function Tests: Recent Labs  Lab 08/24/17 1808 08/28/17 1443  AST 17  --   ALT 7*  --   ALKPHOS 93  --   BILITOT 0.4  --   PROT 6.2*  --   ALBUMIN 2.9* 3.1*   No results for input(s): LIPASE, AMYLASE in the last 168 hours. No results for input(s): AMMONIA in the last 168 hours. Coagulation Profile: Recent Labs  Lab 08/24/17 1808  INR 1.19   Cardiac Enzymes: No results for input(s): CKTOTAL, CKMB, CKMBINDEX, TROPONINI in the last 168 hours. BNP (last 3 results) No results for input(s): PROBNP in the last 8760 hours. HbA1C: No results for input(s): HGBA1C in the last 72 hours. CBG: Recent Labs  Lab 08/25/17 0615 08/26/17 0626 08/27/17 0422 08/28/17 0611 08/29/17 0631  GLUCAP 140* 111* 109* 127* 106*   Lipid Profile: No results for input(s): CHOL, HDL, LDLCALC, TRIG, CHOLHDL, LDLDIRECT in the last 72 hours. Thyroid Function Tests: No results for input(s): TSH, T4TOTAL, FREET4, T3FREE, THYROIDAB in the last 72 hours. Anemia Panel: No results for input(s): VITAMINB12, FOLATE, FERRITIN, TIBC, IRON, RETICCTPCT in the last 72 hours. Sepsis Labs: No results for input(s): PROCALCITON, LATICACIDVEN in the  last 168 hours.  No results found for this or any previous visit (from the past 240 hour(s)).       Radiology Studies: Dg Chest 2 View  Result Date: 08/27/2017 CLINICAL DATA:  CHF. Shortness of breath, cough and testicular swelling EXAM: CHEST - 2 VIEW COMPARISON:  Chest x-rays dated 09/22/2014 and 03/01/2013 FINDINGS: Heart size and mediastinal contours are within normal limits. Lungs are hyperexpanded. Lungs are clear. No pleural effusion or pneumothorax seen. Osseous structures about the chest are unremarkable. IMPRESSION: 1. No active cardiopulmonary disease. No evidence of pneumonia or pulmonary edema. 2. Hyperexpanded lungs indicating COPD. Electronically Signed   By: Franki Cabot M.D.   On: 08/27/2017 20:02        Scheduled Meds: . carvedilol  12.5 mg Oral BID  . cephALEXin  250 mg Oral Q12H  . cholecalciferol  2,000 Units Oral Daily  . clopidogrel  75 mg Oral Daily  . fluticasone furoate-vilanterol  1 puff Inhalation Daily  . heparin  5,000 Units Subcutaneous Q8H  .  mouth rinse  15 mL Mouth Rinse BID  . metolazone  5 mg Oral Daily  . pantoprazole  40 mg Oral BID  . patiromer  8.4 g Oral BID  . warfarin  2.5 mg Oral ONCE-1800  . Warfarin - Pharmacist Dosing Inpatient   Does not apply q1800   Continuous Infusions: . furosemide 160 mg (08/29/17 0938)     LOS: 5 days    Georgette Shell, MD [facility]   If 7PM-7AM, please contact night-coverage [email or web address] Password [facility] 08/29/2017, 12:23 PM

## 2017-08-30 DIAGNOSIS — N186 End stage renal disease: Secondary | ICD-10-CM

## 2017-08-30 LAB — BASIC METABOLIC PANEL
Anion gap: 16 — ABNORMAL HIGH (ref 5–15)
BUN: 78 mg/dL — AB (ref 6–20)
CO2: 24 mmol/L (ref 22–32)
CREATININE: 6.29 mg/dL — AB (ref 0.61–1.24)
Calcium: 7.7 mg/dL — ABNORMAL LOW (ref 8.9–10.3)
Chloride: 91 mmol/L — ABNORMAL LOW (ref 101–111)
GFR calc Af Amer: 10 mL/min — ABNORMAL LOW (ref >60)
GFR calc non Af Amer: 9 mL/min — ABNORMAL LOW (ref >60)
GLUCOSE: 102 mg/dL — AB (ref 65–99)
Potassium: 4.8 mmol/L (ref 3.5–5.1)
Sodium: 131 mmol/L — ABNORMAL LOW (ref 135–145)

## 2017-08-30 LAB — CBC
HEMATOCRIT: 27.8 % — AB (ref 39.0–52.0)
HEMOGLOBIN: 9.1 g/dL — AB (ref 13.0–17.0)
MCH: 31.8 pg (ref 26.0–34.0)
MCHC: 32.7 g/dL (ref 30.0–36.0)
MCV: 97.2 fL (ref 78.0–100.0)
Platelets: 223 10*3/uL (ref 150–400)
RBC: 2.86 MIL/uL — ABNORMAL LOW (ref 4.22–5.81)
RDW: 13.9 % (ref 11.5–15.5)
WBC: 5.8 10*3/uL (ref 4.0–10.5)

## 2017-08-30 LAB — GLUCOSE, CAPILLARY: Glucose-Capillary: 103 mg/dL — ABNORMAL HIGH (ref 65–99)

## 2017-08-30 LAB — PROTIME-INR
INR: 0.92
Prothrombin Time: 12.3 seconds (ref 11.4–15.2)

## 2017-08-30 MED ORDER — CALCIUM ACETATE (PHOS BINDER) 667 MG PO CAPS
1334.0000 mg | ORAL_CAPSULE | Freq: Three times a day (TID) | ORAL | Status: DC
  Administered 2017-08-30 – 2017-09-05 (×12): 1334 mg via ORAL
  Filled 2017-08-30 (×14): qty 2

## 2017-08-30 MED ORDER — DARBEPOETIN ALFA 100 MCG/0.5ML IJ SOSY
100.0000 ug | PREFILLED_SYRINGE | INTRAMUSCULAR | Status: DC
  Filled 2017-08-30: qty 0.5

## 2017-08-30 NOTE — Progress Notes (Signed)
PROGRESS NOTE        PATIENT DETAILS Name: Aaron [name] Age: 62 y.o. Sex: male Date of Birth: 1956-04-02 Admit Date: 08/24/2017 Admitting Physician Aaron Milan, MD HUD:JSHFWYOV, [place]  Brief Narrative: Patient is a 62 y.o. male with history of stage IV/stage V chronic kidney disease-who underwent lower extremity angiography on 4/34 critical limb ischemia and a right external iliac, common femoral, superficial femoral artery endarterectomy with patch angioplasty and placement of a stent on 5/3-admitted with anasarca in the setting of worsening renal function due to probable contrast nephropathy.  See below for further details  Subjective: He needs to have significant swelling in his scrotum and lower extremity in spite of high doses of diuretics.  Assessment/Plan: Anasarca secondary to worsening renal failure due to probable contrast-induced nephropathy: Unfortunately continues to have significant amount of edema in spite of high dose of Lasix, nephrology planning on starting hemodialysis on 5/20.  Paroxysmal atrial fibrillation: Currently in sinus rhythm-continue to hold Coumadin until all procedures for vascular access to start HD is complete.  Discussed with pharmacy.  Continue Coreg.  Peripheral arterial disease: As noted above-on 5/3 underwent endarterectomy of femoral, profundofemoral, SFA and stent placement in right common iliac artery, right external iliac artery, and right superficial femoral artery-remains on Plavix.  Hypertension: Controlled-continue Coreg, metolazone and Lasix  Anemia: Likely secondary to chronic kidney disease-continue with darbepoetin per nephrology.  GERD: Continue PPI  History of polycystic kidney disease: See above  DVT Prophylaxis: Prophylactic Heparin  Code Status: Full code   Family Communication: None at bedside  Disposition Plan: Remain inpatient-will require several more days of  hospitalization prior to discharge  Antimicrobial agents: Anti-infectives (From admission, onward)   Start     Dose/Rate Route Frequency Ordered Stop   08/26/17 1300  cephALEXin (KEFLEX) capsule 250 mg     250 mg Oral Every 12 hours 08/26/17 1132     08/25/17 1000  ceFAZolin (ANCEF) IVPB 1 g/50 mL premix  Status:  Discontinued     1 g 100 mL/hr over 30 Minutes Intravenous Every 12 hours 08/24/17 2223 08/26/17 1132   08/24/17 1915  ceFAZolin (ANCEF) IVPB 1 g/50 mL premix     1 g 100 mL/hr over 30 Minutes Intravenous  Once 08/24/17 1911 08/24/17 2120      Procedures: None  CONSULTS:  nephrology and vascular surgery  Time spent: 25- minutes-Greater than 50% of this time was spent in counseling, explanation of diagnosis, planning of further management, and coordination of care.  MEDICATIONS: Scheduled Meds: . calcium acetate  1,334 mg Oral TID WC  . carvedilol  12.5 mg Oral BID  . cephALEXin  250 mg Oral Q12H  . cholecalciferol  2,000 Units Oral Daily  . clopidogrel  75 mg Oral Daily  . [START ON 08/31/2017] darbepoetin (ARANESP) injection - DIALYSIS  100 mcg Intravenous Q Mon-HD  . fluticasone furoate-vilanterol  1 puff Inhalation Daily  . heparin  5,000 Units Subcutaneous Q8H  . mouth rinse  15 mL Mouth Rinse BID  . metolazone  5 mg Oral Daily  . pantoprazole  40 mg Oral BID  . Warfarin - Pharmacist Dosing Inpatient   Does not apply q1800   Continuous Infusions: . furosemide Stopped (08/30/17 0909)   PRN Meds:.HYDROmorphone (DILAUDID) injection, ondansetron **OR** ondansetron (ZOFRAN) IV, oxyCODONE   PHYSICAL EXAM: Vital signs:  Vitals:   08/29/17 1500 08/29/17 2056 08/30/17 0600 08/30/17 0911  BP: 125/74 (!) 149/101    Pulse: 89 71    Resp: (!) 24 13    Temp: (!) 97.5 F (36.4 C) 97.7 F (36.5 C)    TempSrc: Oral Oral    SpO2: 96% 100%  99%  Weight:  98.5 kg (217 lb 1.6 oz) 98.5 kg (217 lb 1.6 oz)   Height:       Filed Weights   08/29/17 0352 08/29/17 2056  08/30/17 0600  Weight: 98.7 kg (217 lb 11.2 oz) 98.5 kg (217 lb 1.6 oz) 98.5 kg (217 lb 1.6 oz)   Body mass index is 27.14 kg/m.   General appearance :Awake, alert, not in any distress.  Eyes:, pupils equally reactive to light and accomodation,no scleral icterus.Pink conjunctiva HEENT: Atraumatic and Normocephalic Neck: supple, no JVD. No cervical lymphadenopathy. No thyromegaly Resp:Good air entry bilaterally, no added sounds  CVS: S1 S2 regular, no murmurs.  GI: Bowel sounds present, Non tender and not distended with no gaurding, rigidity or rebound.No organomegaly continues to have significant amount of scrotal edema as well Extremities: B/L Lower Ext shows +++ edema, both legs are warm to touch Neurology:  speech clear,Non focal, sensation is grossly intact. Musculoskeletal:No digital cyanosis Skin:No Rash, warm and dry Wounds:N/A  I have personally reviewed following labs and imaging studies  LABORATORY DATA: CBC: Recent Labs  Lab 08/24/17 1808 08/24/17 2253 08/28/17 0416 08/30/17 0411  WBC 9.4 8.5 6.8 5.8  NEUTROABS 7.3 6.2 5.1  --   HGB 9.4* 10.0* 9.6* 9.1*  HCT 29.3* 31.3* 30.2* 27.8*  MCV 100.0 101.6* 101.0* 97.2  PLT 222 247 225 098    Basic Metabolic Panel: Recent Labs  Lab 08/27/17 0405 08/28/17 0416 08/28/17 1443 08/29/17 0331 08/30/17 0411  NA 136 135 135 135 131*  K 5.4* 5.7* 5.8* 5.5* 4.8  CL 99* 98* 97* 95* 91*  CO2 25 25 24 25 24   GLUCOSE 108* 105* 138* 94 102*  BUN 58* 62* 66* 67* 78*  CREATININE 5.84* 5.82* 6.46* 5.92* 6.29*  CALCIUM 7.6* 7.9* 7.8* 7.8* 7.7*  PHOS  --   --  9.1*  --   --     GFR: Estimated Creatinine Clearance: 14.7 mL/min (A) (by C-G formula based on SCr of 6.29 mg/dL (H)).  Liver Function Tests: Recent Labs  Lab 08/24/17 1808 08/28/17 1443  AST 17  --   ALT 7*  --   ALKPHOS 93  --   BILITOT 0.4  --   PROT 6.2*  --   ALBUMIN 2.9* 3.1*   No results for input(s): LIPASE, AMYLASE in the last 168 hours. No  results for input(s): AMMONIA in the last 168 hours.  Coagulation Profile: Recent Labs  Lab 08/24/17 1808 08/30/17 0411  INR 1.19 0.92    Cardiac Enzymes: No results for input(s): CKTOTAL, CKMB, CKMBINDEX, TROPONINI in the last 168 hours.  BNP (last 3 results) No results for input(s): PROBNP in the last 8760 hours.  HbA1C: No results for input(s): HGBA1C in the last 72 hours.  CBG: Recent Labs  Lab 08/26/17 0626 08/27/17 0422 08/28/17 0611 08/29/17 0631 08/30/17 0622  GLUCAP 111* 109* 127* 106* 103*    Lipid Profile: No results for input(s): CHOL, HDL, LDLCALC, TRIG, CHOLHDL, LDLDIRECT in the last 72 hours.  Thyroid Function Tests: No results for input(s): TSH, T4TOTAL, FREET4, T3FREE, THYROIDAB in the last 72 hours.  Anemia Panel: No results for input(s): VITAMINB12,  FOLATE, FERRITIN, TIBC, IRON, RETICCTPCT in the last 72 hours.  Urine analysis:    Component Value Date/Time   COLORURINE STRAW (A) 08/14/2017 0846   APPEARANCEUR CLEAR 08/14/2017 0846   LABSPEC 1.006 08/14/2017 0846   PHURINE 5.0 08/14/2017 0846   GLUCOSEU NEGATIVE 08/14/2017 0846   HGBUR SMALL (A) 08/14/2017 0846   BILIRUBINUR NEGATIVE 08/14/2017 0846   KETONESUR NEGATIVE 08/14/2017 0846   PROTEINUR NEGATIVE 08/14/2017 0846   UROBILINOGEN 0.2 03/03/2010 1056   NITRITE NEGATIVE 08/14/2017 0846   LEUKOCYTESUR NEGATIVE 08/14/2017 0846    Sepsis Labs: Lactic Acid, Venous No results found for: LATICACIDVEN  MICROBIOLOGY: No results found for this or any previous visit (from the past 240 hour(s)).  RADIOLOGY STUDIES/RESULTS: Ct Abdomen Pelvis Wo Contrast  Result Date: 08/27/2017 CLINICAL DATA:  Right leg pain and scrotal swelling. EXAM: CT ABDOMEN AND PELVIS WITHOUT CONTRAST TECHNIQUE: Multidetector CT imaging of the abdomen and pelvis was performed following the standard protocol without IV contrast. COMPARISON:  CT scan of April 27, 2017. FINDINGS: Lower chest: Minimal right posterior  basilar subsegmental atelectasis is noted. Hepatobiliary: No gallstones are noted. Stable multiple small hepatic cysts are noted. No biliary dilatation is noted. Pancreas: Unremarkable. No pancreatic ductal dilatation or surrounding inflammatory changes. Spleen: Normal in size without focal abnormality. Adrenals/Urinary Tract: Adrenal glands appear normal. Stable severe polycystic kidney disease is seen bilaterally. No hydronephrosis or renal obstruction is noted. No renal or ureteral calculi are noted. Urinary bladder is unremarkable. Stomach/Bowel: Stomach is within normal limits. Appendix appears normal. No evidence of bowel wall thickening, distention, or inflammatory changes. Sigmoid diverticulosis is noted without inflammation. Vascular/Lymphatic: Aortic atherosclerosis. No enlarged abdominal or pelvic lymph nodes. Stents are noted in the right common and external iliac arteries. Reproductive: Prostate is unremarkable. Other: Mild anasarca is noted. Large amount of fluid is noted in the scrotum. Musculoskeletal: Extensive postsurgical and degenerative changes are seen involving the lumbar spine. No acute abnormality is noted. IMPRESSION: Stable severe polycystic kidney disease is noted, with stable cysts seen throughout hepatic parenchyma most consistent with associated polycystic liver disease. Sigmoid diverticulosis without inflammation. Mild anasarca is noted. Large amount of fluid is noted in the scrotum. Aortic Atherosclerosis (ICD10-I70.0). Electronically Signed   By: Marijo Conception, M.D.   On: 08/27/2017 11:53   Dg Chest 2 View  Result Date: 08/27/2017 CLINICAL DATA:  CHF. Shortness of breath, cough and testicular swelling EXAM: CHEST - 2 VIEW COMPARISON:  Chest x-rays dated 09/22/2014 and 03/01/2013 FINDINGS: Heart size and mediastinal contours are within normal limits. Lungs are hyperexpanded. Lungs are clear. No pleural effusion or pneumothorax seen. Osseous structures about the chest are  unremarkable. IMPRESSION: 1. No active cardiopulmonary disease. No evidence of pneumonia or pulmonary edema. 2. Hyperexpanded lungs indicating COPD. Electronically Signed   By: Franki Cabot M.D.   On: 08/27/2017 20:02   Ct Head Wo Contrast  Result Date: 08/24/2017 CLINICAL DATA:  Fall last night, bilateral diplopia starting today. EXAM: CT HEAD WITHOUT CONTRAST TECHNIQUE: Contiguous axial images were obtained from the base of the skull through the vertex without intravenous contrast. COMPARISON:  Head CT dated 09/22/2014. FINDINGS: Brain: Mild generalized age related parenchymal atrophy with commensurate dilatation of the ventricles and sulci. Ventricles are stable in size and configuration. Again noted is a prominent CSF space adjacent to the RIGHT temporal lobe, compatible with chronic arachnoid cyst. There is no parenchymal mass, hemorrhage or edema appreciated. No extra-axial hemorrhage. Vascular: There are chronic calcified atherosclerotic changes of the large  vessels at the skull base. No unexpected hyperdense vessel. Skull: Normal. Negative for fracture or focal lesion. Sinuses/Orbits: Chronic RIGHT maxillary sinus disease. No acute findings. Other: None. IMPRESSION: 1. No acute findings. No intracranial hemorrhage or edema. No skull fracture. 2. Chronic RIGHT maxillary sinus disease. Electronically Signed   By: Franki Cabot M.D.   On: 08/24/2017 21:05   US Pelvis Limited (transabdominal Only)  Result Date: 08/25/2017 CLINICAL DATA:  Inguinal hernia EXAM: LIMITED ULTRASOUND OF PELVIS TECHNIQUE: Limited transabdominal ultrasound examination of the pelvis was performed. COMPARISON:  CT abdomen and pelvis 04/27/2017 FINDINGS: On prior CT patient has a fat containing LEFT inguinal hernia. Prior CT also demonstrates a probable prior RIGHT inguinal hernia repair. On sonography, echogenic masslike focus is identified at the RIGHT inguinal region which may may either represent sequela/deformity as a result  of prior RIGHT inguinal herniorrhaphy or a recurrent RIGHT inguinal hernia. On the LEFT, a heterogeneous focus is seen at the LEFT inguinal canal suspicious for a LEFT inguinal hernia containing fat and fluid. No mass or adenopathy identified. IMPRESSION: Known LEFT inguinal hernia by prior CT. Apparent prior RIGHT inguinal hernia repair. Sonography demonstrates a LEFT inguinal hernia and either postsurgical changes or potential recurrent hernia at the RIGHT inguinal region; further evaluation by a noncontrast CT exam would be beneficial in assessing for potential recurrent RIGHT inguinal hernia. Electronically Signed   By: Lavonia Dana M.D.   On: 08/25/2017 10:16   Ct Femur Right Wo Contrast  Result Date: 08/27/2017 CLINICAL DATA:  Scrotal and right leg swelling. Recent right CFA endarterectomy and stenting 10 days ago. EXAM: CT OF THE LOWER RIGHT EXTREMITY WITHOUT CONTRAST TECHNIQUE: Multidetector CT imaging of the right lower extremity was performed according to the standard protocol. COMPARISON:  None. FINDINGS: Bones/Joint/Cartilage No acute fracture or dislocation. Severe osteoarthritis of the right hip joint. Ligaments Suboptimally assessed by CT. Muscles and Tendons Unremarkable. Soft tissues Right external iliac, right proximal SFA, and distal left SFA stents. Atherosclerotic vascular calcifications. Postsurgical changes in the right groin with small right inguinal fluid collection. Diffuse anasarca. Moderate right and mild left thigh circumferential soft tissue swelling. Severe scrotal and penile edema. IMPRESSION: 1. Postsurgical changes in the right groin with small right inguinal simple appearing fluid collection, favoring seroma. 2. Diffuse anasarca.  Severe scrotal edema. 3. Severe right hip osteoarthritis.  No acute osseous abnormality. Electronically Signed   By: Titus Dubin M.D.   On: 08/27/2017 12:07     LOS: 6 days   Oren Binet, MD  [facility]  If 7PM-7AM, please  contact night-coverage  Please page via [email or web address] [email or web address]-click on MD name and type text message  08/30/2017, 10:41 AM

## 2017-08-30 NOTE — Progress Notes (Addendum)
ANTICOAGULATION CONSULT NOTE - Follow Up Consult  Pharmacy Consult for warfarin Indication: atrial fibrillation  No Known Allergies  Patient Measurements: Height: 6\' 3"  (190.5 cm) Weight: 217 lb 1.6 oz (98.5 kg) IBW/kg (Calculated) : 84.5  Vital Signs:    Labs: Recent Labs    08/28/17 0416 08/28/17 1443 08/29/17 0331 08/30/17 0411  HGB 9.6*  --   --  9.1*  HCT 30.2*  --   --  27.8*  PLT 225  --   --  223  LABPROT  --   --   --  12.3  INR  --   --   --  0.92  CREATININE 5.82* 6.46* 5.92* 6.29*   Estimated Creatinine Clearance: 14.7 mL/min (A) (by C-G formula based on SCr of 6.29 mg/dL (H)).  Medical History: Past Medical History:  Diagnosis Date  . Anxiety   . Arthritis    knees , back , shoulders  . CAD (coronary artery disease)    Mild nonobstructive disease at cardiac catheterization 2002  . Chronic back pain   . CKD (chronic kidney disease), stage II   . COPD (chronic obstructive pulmonary disease) ([place])   . Diverticulitis   . Esophageal reflux   . Essential hypertension   . Hepatitis C    states he no longer has this  . History of kidney stones   . History of syncope   . Hyperlipidemia   . Nephrolithiasis   . PAT (paroxysmal atrial tachycardia) ([place])   . Peripheral arterial disease ([facility])    Occluded left superficial femoral artery status post stent June 2016 - Dr. Trula Slade  . Pneumonia    as child   62 years old  . Polycystic kidney, unspecified type   . Syncope 09/2014   Medications:  Medications Prior to Admission  Medication Sig Dispense Refill Last Dose  . BREO ELLIPTA 200-25 MCG/INH AEPB Inhale 1 puff into the lungs daily.   08/23/2017 at Unknown time  . carvedilol (COREG) 25 MG tablet Take 1 tablet (25 mg total) 2 (two) times daily by mouth. 180 tablet 3 08/24/2017 at 630a  . cholecalciferol (VITAMIN D) 1000 units tablet Take 2,000 Units by mouth daily.   08/23/2017 at Unknown time  . clopidogrel (PLAVIX) 75 MG tablet Take 1 tablet (75 mg total) by  mouth daily. 30 tablet 3 08/23/2017 at Unknown time  . doxazosin (CARDURA) 8 MG tablet Take 8 mg by mouth at bedtime.    08/23/2017 at Unknown time  . oxyCODONE-acetaminophen (PERCOCET/ROXICET) 5-325 MG tablet Take 1 tablet by mouth every 6 (six) hours as needed. (Patient taking differently: Take 1 tablet by mouth every 3 (three) hours as needed for moderate pain. ) 20 tablet 0 08/23/2017 at Unknown time  . torsemide (DEMADEX) 20 MG tablet Take 1 tablet (20 mg total) by mouth daily. 30 tablet 0 08/23/2017 at Unknown time  . pantoprazole (PROTONIX) 40 MG tablet Take 1 tablet (40 mg total) by mouth 2 (two) times daily. 60 tablet 1 08/14/2017 at 0630   Assessment: 62 y.o.M PMH of afib on warfarin PTA presents with right groin pain and noticed right groin area and genitalia edema.  Warfarin was to be resumed yesterday but held due to bleeding which was reported by his RN.  Hemoglobin down slightly this AM - 9.1 from 9.6 but no further bleeding noted by staff.  Plans for HD noted.  PTA warfarin dose: 2.5 mg every Sun, Thu; 5 mg all other days    Goal  of Therapy:  INR 2-3 Monitor platelets by anticoagulation protocol: Yes   Plan:  Warfarin 2.5mg  x1 tonight (ADDENDUM: - holding for possible procedure tomorrow) Daily INR/CBC Monitor for s/sx of bleeding  Rober Minion, PharmD., MS Clinical Pharmacist Pager:  [phone] Thank you for allowing pharmacy to be part of this patients care team. 08/30/2017,9:44 AM

## 2017-08-30 NOTE — Progress Notes (Signed)
Subjective:  Only 200 of UOP recorded- weight unchanged- kidney numbers worse  - he says making urine just cannot collect due to penile edema Objective Vital signs in last 24 hours: Vitals:   08/29/17 0858 08/29/17 1500 08/29/17 2056 08/30/17 0600  BP:  125/74 (!) 149/101   Pulse:  89 71   Resp:  (!) 24 13   Temp:  (!) 97.5 F (36.4 C) 97.7 F (36.5 C)   TempSrc:  Oral Oral   SpO2: 97% 96% 100%   Weight:   98.5 kg (217 lb 1.6 oz) 98.5 kg (217 lb 1.6 oz)  Height:       Weight change: -0.272 kg (-9.6 oz)  Intake/Output Summary (Last 24 hours) at 08/30/2017 0825 Last data filed at 08/30/2017 0003 Gross per 24 hour  Intake 852 ml  Output 200 ml  Net 652 ml    Assessment/ Plan: Pt is a 62 y.o. 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 Renal- advanced CKD at baseline- crt 5 -  worsening in the setting of contrast and stenting- not uremic -  no acute indications for HD but volume is an issue.  Kidney numbers worse today.  I think can now say that he has failed medical management and we now need to do dialysis- pt in agreement with that plan- will plan on [facility] followed by first HD on 5/20 and initiate CLIP- [facility] consult for access this coming week 2. HTN/vol- volume overload- weight unchanged and he says is making urine- maybe responding to max lasix/zarox but min UOP recorded- weight unchanged. cont to attempt strict I's and O's and daily weights but to initiate HD to correct volume status quicker- cont diuretics for now 3. Anemia- decreasing- check iron stores and add ESA 4. Hyperkalemia- gave veltassa- - now improved will stop- should not be an issue once start HD 5. Bones- start binder for phos 9.1 and check intact PTH    Tamaka Sawin A    Labs: Basic Metabolic Panel: Recent Labs  Lab  08/28/17 1443 08/29/17 0331 08/30/17 0411  NA 135 135 131*  K 5.8* 5.5* 4.8  CL 97* 95* 91*  CO2 24 25 24   GLUCOSE 138* 94 102*  BUN 66* 67* 78*  CREATININE 6.46* 5.92* 6.29*  CALCIUM 7.8* 7.8* 7.7*  PHOS 9.1*  --   --    Liver Function Tests: Recent Labs  Lab 08/24/17 1808 08/28/17 1443  AST 17  --   ALT 7*  --   ALKPHOS 93  --   BILITOT 0.4  --   PROT 6.2*  --   ALBUMIN 2.9* 3.1*   No results for input(s): LIPASE, AMYLASE in the last 168 hours. No results for input(s): AMMONIA in the last 168 hours. CBC: Recent Labs  Lab 08/24/17 1808 08/24/17 2253 08/28/17 0416 08/30/17 0411  WBC 9.4 8.5 6.8 5.8  NEUTROABS 7.3 6.2 5.1  --   HGB 9.4* 10.0* 9.6* 9.1*  HCT 29.3* 31.3* 30.2* 27.8*  MCV 100.0 101.6* 101.0* 97.2  PLT 222 247 225 223   Cardiac Enzymes: No results for input(s): CKTOTAL, CKMB, CKMBINDEX, TROPONINI in the last 168 hours. CBG: Recent Labs  Lab 08/26/17 0626 08/27/17 0422 08/28/17 0611 08/29/17 0631 08/30/17 0622  GLUCAP 111* 109* 127* 106* 103*    Iron Studies:  No results for input(s): IRON, TIBC, TRANSFERRIN, FERRITIN in the last 72 hours. Studies/Results: No results found. Medications: Infusions: . furosemide 160 mg (08/30/17 9826)    Scheduled Medications: . carvedilol  12.5 mg Oral BID  . cephALEXin  250 mg Oral Q12H  . cholecalciferol  2,000 Units Oral Daily  . clopidogrel  75 mg Oral Daily  . fluticasone furoate-vilanterol  1 puff Inhalation Daily  . heparin  5,000 Units Subcutaneous Q8H  . mouth rinse  15 mL Mouth Rinse BID  . metolazone  5 mg Oral Daily  . pantoprazole  40 mg Oral BID  . patiromer  8.4 g Oral BID  . Warfarin - Pharmacist Dosing Inpatient   Does not apply q1800    have reviewed scheduled and prn medications.  Physical Exam: General: walking around room- breakfast better Heart: RRR Lungs: dec BS at bases - not hypoxic  Abdomen: distended, non tender- abdominal wall edema  Extremities: evidence of  previous surg- incision in groin- diffuse significant edema throughout lower body    08/30/2017,8:25 AM  LOS: 6 days

## 2017-08-31 ENCOUNTER — Inpatient Hospital Stay (HOSPITAL_COMMUNITY): Payer: 59

## 2017-08-31 ENCOUNTER — Encounter (HOSPITAL_COMMUNITY): Payer: Self-pay | Admitting: Interventional Radiology

## 2017-08-31 HISTORY — PX: IR US GUIDE VASC ACCESS RIGHT: IMG2390

## 2017-08-31 HISTORY — PX: IR FLUORO GUIDE CV LINE RIGHT: IMG2283

## 2017-08-31 LAB — RENAL FUNCTION PANEL
ANION GAP: 18 — AB (ref 5–15)
Albumin: 3.2 g/dL — ABNORMAL LOW (ref 3.5–5.0)
BUN: 82 mg/dL — ABNORMAL HIGH (ref 6–20)
CALCIUM: 7.7 mg/dL — AB (ref 8.9–10.3)
CO2: 25 mmol/L (ref 22–32)
Chloride: 88 mmol/L — ABNORMAL LOW (ref 101–111)
Creatinine, Ser: 6.25 mg/dL — ABNORMAL HIGH (ref 0.61–1.24)
GFR calc Af Amer: 10 mL/min — ABNORMAL LOW (ref >60)
GFR calc non Af Amer: 9 mL/min — ABNORMAL LOW (ref >60)
GLUCOSE: 98 mg/dL (ref 65–99)
Phosphorus: 10.9 mg/dL — ABNORMAL HIGH (ref 2.5–4.6)
Potassium: 4.1 mmol/L (ref 3.5–5.1)
SODIUM: 131 mmol/L — AB (ref 135–145)

## 2017-08-31 LAB — CBC
HCT: 27.6 % — ABNORMAL LOW (ref 39.0–52.0)
HEMOGLOBIN: 9.2 g/dL — AB (ref 13.0–17.0)
MCH: 31.9 pg (ref 26.0–34.0)
MCHC: 33.3 g/dL (ref 30.0–36.0)
MCV: 95.8 fL (ref 78.0–100.0)
Platelets: 235 10*3/uL (ref 150–400)
RBC: 2.88 MIL/uL — AB (ref 4.22–5.81)
RDW: 13.7 % (ref 11.5–15.5)
WBC: 6.2 10*3/uL (ref 4.0–10.5)

## 2017-08-31 LAB — IRON AND TIBC
IRON: 45 ug/dL (ref 45–182)
Saturation Ratios: 19 % (ref 17.9–39.5)
TIBC: 232 ug/dL — AB (ref 250–450)
UIBC: 187 ug/dL

## 2017-08-31 LAB — GLUCOSE, CAPILLARY: Glucose-Capillary: 110 mg/dL — ABNORMAL HIGH (ref 65–99)

## 2017-08-31 LAB — PROTIME-INR
INR: 0.94
PROTHROMBIN TIME: 12.5 s (ref 11.4–15.2)

## 2017-08-31 MED ORDER — LIDOCAINE HCL (PF) 1 % IJ SOLN
INTRAMUSCULAR | Status: AC | PRN
  Administered 2017-08-31: 10 mL

## 2017-08-31 MED ORDER — LIDOCAINE HCL 1 % IJ SOLN
INTRAMUSCULAR | Status: AC
  Filled 2017-08-31: qty 20

## 2017-08-31 MED ORDER — HEPARIN SODIUM (PORCINE) 1000 UNIT/ML IJ SOLN
INTRAMUSCULAR | Status: AC
  Administered 2017-08-31: 2.8 [IU]
  Filled 2017-08-31: qty 1

## 2017-08-31 NOTE — Progress Notes (Signed)
ANTICOAGULATION CONSULT NOTE - Follow Up Consult  Pharmacy Consult for warfarin Indication: atrial fibrillation  No Known Allergies  Patient Measurements: Height: 6\' 3"  (190.5 cm) Weight: 211 lb 8 oz (95.9 kg) IBW/kg (Calculated) : 84.5  Vital Signs: Temp: 98.3 F (36.8 C) (05/20 0516) BP: 150/98 (05/20 0516) Pulse Rate: 59 (05/20 0516)  Labs: Recent Labs    08/29/17 0331 08/30/17 0411 08/31/17 0427  HGB  --  9.1* 9.2*  HCT  --  27.8* 27.6*  PLT  --  223 235  LABPROT  --  12.3 12.5  INR  --  0.92 0.94  CREATININE 5.92* 6.29* 6.25*   Estimated Creatinine Clearance: 14.8 mL/min (A) (by C-G formula based on SCr of 6.25 mg/dL (H)).  Medical History: Past Medical History:  Diagnosis Date  . Anxiety   . Arthritis    knees , back , shoulders  . CAD (coronary artery disease)    Mild nonobstructive disease at cardiac catheterization 2002  . Chronic back pain   . CKD (chronic kidney disease), stage II   . COPD (chronic obstructive pulmonary disease) ([place])   . Diverticulitis   . Esophageal reflux   . Essential hypertension   . Hepatitis C    states he no longer has this  . History of kidney stones   . History of syncope   . Hyperlipidemia   . Nephrolithiasis   . PAT (paroxysmal atrial tachycardia) ([place])   . Peripheral arterial disease (HCC)    Occluded left superficial femoral artery status post stent June 2016 - Dr. Trula Slade  . Pneumonia    as child   59 years old  . Polycystic kidney, unspecified type   . Syncope 09/2014   Medications:  Medications Prior to Admission  Medication Sig Dispense Refill Last Dose  . BREO ELLIPTA 200-25 MCG/INH AEPB Inhale 1 puff into the lungs daily.   08/23/2017 at Unknown time  . carvedilol (COREG) 25 MG tablet Take 1 tablet (25 mg total) 2 (two) times daily by mouth. 180 tablet 3 08/24/2017 at 630a  . cholecalciferol (VITAMIN D) 1000 units tablet Take 2,000 Units by mouth daily.   08/23/2017 at Unknown time  . clopidogrel (PLAVIX) 75  MG tablet Take 1 tablet (75 mg total) by mouth daily. 30 tablet 3 08/23/2017 at Unknown time  . doxazosin (CARDURA) 8 MG tablet Take 8 mg by mouth at bedtime.    08/23/2017 at Unknown time  . oxyCODONE-acetaminophen (PERCOCET/ROXICET) 5-325 MG tablet Take 1 tablet by mouth every 6 (six) hours as needed. (Patient taking differently: Take 1 tablet by mouth every 3 (three) hours as needed for moderate pain. ) 20 tablet 0 08/23/2017 at Unknown time  . torsemide (DEMADEX) 20 MG tablet Take 1 tablet (20 mg total) by mouth daily. 30 tablet 0 08/23/2017 at Unknown time  . pantoprazole (PROTONIX) 40 MG tablet Take 1 tablet (40 mg total) by mouth 2 (two) times daily. 60 tablet 1 08/14/2017 at 0630   Assessment: 62 y.o.M PMH of afib on warfarin PTA presents with right groin pain and noticed right groin area and genitalia edema.  Warfarin was to be resumed but held due to bleeding which was reported by his RN.  Now warfarin is being held for anticipated tunneled catheter placement later today. CBC stable  PTA warfarin dose: 2.5 mg every Sun, Thu; 5 mg all other days   Goal of Therapy:  INR 2-3 Monitor platelets by anticoagulation protocol: Yes   Plan:  Hold warfarin  for catheter placement Daily INR/CBC Monitor for s/sx of bleeding  Georga Bora, PharmD Clinical Pharmacist 08/31/2017 11:10 AM

## 2017-08-31 NOTE — Progress Notes (Signed)
PROGRESS NOTE        PATIENT DETAILS Name: Aaron [name] Age: 62 y.o. Sex: male Date of Birth: [birth year] Admit Date: 08/24/2017 Admitting Physician Reubin Milan, MD CWC:[id number], [place]  Brief Narrative: Patient is a 62 y.o. male with history of stage IV/stage V chronic kidney disease-who underwent lower extremity angiography on 4/34 critical limb ischemia and a right external iliac, common femoral, superficial femoral artery endarterectomy with patch angioplasty and placement of a stent on 5/3-admitted with anasarca in the setting of worsening renal function due to probable contrast nephropathy.  See below for further details  Subjective: Still has significant lower extremity edema/scrotal edema.  No chest pain or shortness of breath.  Assessment/Plan: Anasarca secondary to worsening renal failure due to probable contrast-induced nephropathy-with likely progression to ESRD: Continues to have significant amount of edema/anasarca in spite of being on high-dose diuretics-nephrology planning on initiating hemodialysis on 5/20.  Awaiting VV is to place HD access.    Paroxysmal atrial fibrillation: Rate controlled with Coreg-appears to be in sinus rhythm-Coumadin on hold until all vascular procedures have been completed.   Peripheral arterial disease: As noted above-on 5/3 underwent endarterectomy of femoral, profundofemoral, SFA and stent placement in right common iliac artery, right external iliac artery, and right superficial femoral artery-remains on Plavix.  Vascular surgery following  Hypertension: Relatively well controlled-continue with Coreg, metolazone and Lasix.  Anemia: Likely secondary to chronic kidney disease-continue with darbepoetin per nephrology.   GERD: Continue PPI  History of polycystic kidney disease: See above  DVT Prophylaxis: Prophylactic Heparin  Code Status: Full code   Family Communication: None at  bedside  Disposition Plan: Remain inpatient-will require several more days of hospitalization prior to discharge  Antimicrobial agents: Anti-infectives (From admission, onward)   Start     Dose/Rate Route Frequency Ordered Stop   08/26/17 1300  cephALEXin (KEFLEX) capsule 250 mg     250 mg Oral Every 12 hours 08/26/17 1132     08/25/17 1000  ceFAZolin (ANCEF) IVPB 1 g/50 mL premix  Status:  Discontinued     1 g 100 mL/hr over 30 Minutes Intravenous Every 12 hours 08/24/17 2223 08/26/17 1132   08/24/17 1915  ceFAZolin (ANCEF) IVPB 1 g/50 mL premix     1 g 100 mL/hr over 30 Minutes Intravenous  Once 08/24/17 1911 08/24/17 2120      Procedures: None  CONSULTS:  nephrology and vascular surgery  Time spent: 25 minutes-Greater than 50% of this time was spent in counseling, explanation of diagnosis, planning of further management, and coordination of care.  MEDICATIONS: Scheduled Meds: . calcium acetate  1,334 mg Oral TID WC  . carvedilol  12.5 mg Oral BID  . cephALEXin  250 mg Oral Q12H  . cholecalciferol  2,000 Units Oral Daily  . clopidogrel  75 mg Oral Daily  . darbepoetin (ARANESP) injection - DIALYSIS  100 mcg Intravenous Q Mon-HD  . fluticasone furoate-vilanterol  1 puff Inhalation Daily  . heparin  5,000 Units Subcutaneous Q8H  . mouth rinse  15 mL Mouth Rinse BID  . metolazone  5 mg Oral Daily  . pantoprazole  40 mg Oral BID  . Warfarin - Pharmacist Dosing Inpatient   Does not apply q1800   Continuous Infusions: . furosemide Stopped (08/31/17 0704)   PRN Meds:.HYDROmorphone (DILAUDID) injection, ondansetron **OR** ondansetron (ZOFRAN) IV,  oxyCODONE   PHYSICAL EXAM: Vital signs: Vitals:   08/30/17 1405 08/30/17 2003 08/31/17 0516 08/31/17 0831  BP: 124/79 126/84 (!) 150/98   Pulse: (!) 52 82 (!) 59   Resp: 17 (!) 30 (!) 24   Temp: 97.8 F (36.6 C) 98.3 F (36.8 C) 98.3 F (36.8 C)   TempSrc: Oral Oral    SpO2: 99% 95% 99% 99%  Weight:   95.9 kg (211 lb 8  oz)   Height:       Filed Weights   08/29/17 2056 08/30/17 0600 08/31/17 0516  Weight: 98.5 kg (217 lb 1.6 oz) 98.5 kg (217 lb 1.6 oz) 95.9 kg (211 lb 8 oz)   Body mass index is 26.44 kg/m.   General appearance:Awake, alert, not in any distress.  Eyes:no scleral icterus. HEENT: Atraumatic and Normocephalic Neck: supple, no JVD. Resp:Good air entry bilaterally,no rales or rhonchi CVS: S1 S2 regular, no murmurs.  GI: Bowel sounds present, Non tender and not distended with no gaurding, rigidity or rebound. Extremities: B/L Lower Ext shows 3+ edema, both legs are warm to touch Neurology:  Non focal Psychiatric: Normal judgment and insight. Normal mood. Musculoskeletal:No digital cyanosis Skin:No Rash, warm and dry Wounds:N/A  I have personally reviewed following labs and imaging studies  LABORATORY DATA: CBC: Recent Labs  Lab 08/24/17 1808 08/24/17 2253 08/28/17 0416 08/30/17 0411 08/31/17 0427  WBC 9.4 8.5 6.8 5.8 6.2  NEUTROABS 7.3 6.2 5.1  --   --   HGB 9.4* 10.0* 9.6* 9.1* 9.2*  HCT 29.3* 31.3* 30.2* 27.8* 27.6*  MCV 100.0 101.6* 101.0* 97.2 95.8  PLT 222 247 225 223 188    Basic Metabolic Panel: Recent Labs  Lab 08/28/17 0416 08/28/17 1443 08/29/17 0331 08/30/17 0411 08/31/17 0427  NA 135 135 135 131* 131*  K 5.7* 5.8* 5.5* 4.8 4.1  CL 98* 97* 95* 91* 88*  CO2 25 24 25 24 25   GLUCOSE 105* 138* 94 102* 98  BUN 62* 66* 67* 78* 82*  CREATININE 5.82* 6.46* 5.92* 6.29* 6.25*  CALCIUM 7.9* 7.8* 7.8* 7.7* 7.7*  PHOS  --  9.1*  --   --  10.9*    GFR: Estimated Creatinine Clearance: 14.8 mL/min (A) (by C-G formula based on SCr of 6.25 mg/dL (H)).  Liver Function Tests: Recent Labs  Lab 08/24/17 1808 08/28/17 1443 08/31/17 0427  AST 17  --   --   ALT 7*  --   --   ALKPHOS 93  --   --   BILITOT 0.4  --   --   PROT 6.2*  --   --   ALBUMIN 2.9* 3.1* 3.2*   No results for input(s): LIPASE, AMYLASE in the last 168 hours. No results for input(s):  AMMONIA in the last 168 hours.  Coagulation Profile: Recent Labs  Lab 08/24/17 1808 08/30/17 0411 08/31/17 0427  INR 1.19 0.92 0.94    Cardiac Enzymes: No results for input(s): CKTOTAL, CKMB, CKMBINDEX, TROPONINI in the last 168 hours.  BNP (last 3 results) No results for input(s): PROBNP in the last 8760 hours.  HbA1C: No results for input(s): HGBA1C in the last 72 hours.  CBG: Recent Labs  Lab 08/27/17 0422 08/28/17 0611 08/29/17 0631 08/30/17 0622 08/31/17 0622  GLUCAP 109* 127* 106* 103* 110*    Lipid Profile: No results for input(s): CHOL, HDL, LDLCALC, TRIG, CHOLHDL, LDLDIRECT in the last 72 hours.  Thyroid Function Tests: No results for input(s): TSH, T4TOTAL, FREET4, T3FREE, THYROIDAB in  the last 72 hours.  Anemia Panel: Recent Labs    08/31/17 0427  TIBC 232*  IRON 45    Urine analysis:    Component Value Date/Time   COLORURINE STRAW (A) 08/14/2017 0846   APPEARANCEUR CLEAR 08/14/2017 0846   LABSPEC 1.006 08/14/2017 0846   PHURINE 5.0 08/14/2017 0846   GLUCOSEU NEGATIVE 08/14/2017 0846   HGBUR SMALL (A) 08/14/2017 0846   BILIRUBINUR NEGATIVE 08/14/2017 0846   KETONESUR NEGATIVE 08/14/2017 0846   PROTEINUR NEGATIVE 08/14/2017 0846   UROBILINOGEN 0.2 03/03/2010 1056   NITRITE NEGATIVE 08/14/2017 0846   LEUKOCYTESUR NEGATIVE 08/14/2017 0846    Sepsis Labs: Lactic Acid, Venous No results found for: LATICACIDVEN  MICROBIOLOGY: No results found for this or any previous visit (from the past 240 hour(s)).  RADIOLOGY STUDIES/RESULTS: Ct Abdomen Pelvis Wo Contrast  Result Date: 08/27/2017 CLINICAL DATA:  Right leg pain and scrotal swelling. EXAM: CT ABDOMEN AND PELVIS WITHOUT CONTRAST TECHNIQUE: Multidetector CT imaging of the abdomen and pelvis was performed following the standard protocol without IV contrast. COMPARISON:  CT scan of April 27, 2017. FINDINGS: Lower chest: Minimal right posterior basilar subsegmental atelectasis is noted.  Hepatobiliary: No gallstones are noted. Stable multiple small hepatic cysts are noted. No biliary dilatation is noted. Pancreas: Unremarkable. No pancreatic ductal dilatation or surrounding inflammatory changes. Spleen: Normal in size without focal abnormality. Adrenals/Urinary Tract: Adrenal glands appear normal. Stable severe polycystic kidney disease is seen bilaterally. No hydronephrosis or renal obstruction is noted. No renal or ureteral calculi are noted. Urinary bladder is unremarkable. Stomach/Bowel: Stomach is within normal limits. Appendix appears normal. No evidence of bowel wall thickening, distention, or inflammatory changes. Sigmoid diverticulosis is noted without inflammation. Vascular/Lymphatic: Aortic atherosclerosis. No enlarged abdominal or pelvic lymph nodes. Stents are noted in the right common and external iliac arteries. Reproductive: Prostate is unremarkable. Other: Mild anasarca is noted. Large amount of fluid is noted in the scrotum. Musculoskeletal: Extensive postsurgical and degenerative changes are seen involving the lumbar spine. No acute abnormality is noted. IMPRESSION: Stable severe polycystic kidney disease is noted, with stable cysts seen throughout hepatic parenchyma most consistent with associated polycystic liver disease. Sigmoid diverticulosis without inflammation. Mild anasarca is noted. Large amount of fluid is noted in the scrotum. Aortic Atherosclerosis (ICD10-I70.0). Electronically Signed   By: Marijo Conception, M.D.   On: 08/27/2017 11:53   Dg Chest 2 View  Result Date: 08/27/2017 CLINICAL DATA:  CHF. Shortness of breath, cough and testicular swelling EXAM: CHEST - 2 VIEW COMPARISON:  Chest x-rays dated 09/22/2014 and 03/01/2013 FINDINGS: Heart size and mediastinal contours are within normal limits. Lungs are hyperexpanded. Lungs are clear. No pleural effusion or pneumothorax seen. Osseous structures about the chest are unremarkable. IMPRESSION: 1. No active  cardiopulmonary disease. No evidence of pneumonia or pulmonary edema. 2. Hyperexpanded lungs indicating COPD. Electronically Signed   By: Franki Cabot M.D.   On: 08/27/2017 20:02   Ct Head Wo Contrast  Result Date: 08/24/2017 CLINICAL DATA:  Fall last night, bilateral diplopia starting today. EXAM: CT HEAD WITHOUT CONTRAST TECHNIQUE: Contiguous axial images were obtained from the base of the skull through the vertex without intravenous contrast. COMPARISON:  Head CT dated 09/22/2014. FINDINGS: Brain: Mild generalized age related parenchymal atrophy with commensurate dilatation of the ventricles and sulci. Ventricles are stable in size and configuration. Again noted is a prominent CSF space adjacent to the RIGHT temporal lobe, compatible with chronic arachnoid cyst. There is no parenchymal mass, hemorrhage or edema appreciated. No  extra-axial hemorrhage. Vascular: There are chronic calcified atherosclerotic changes of the large vessels at the skull base. No unexpected hyperdense vessel. Skull: Normal. Negative for fracture or focal lesion. Sinuses/Orbits: Chronic RIGHT maxillary sinus disease. No acute findings. Other: None. IMPRESSION: 1. No acute findings. No intracranial hemorrhage or edema. No skull fracture. 2. Chronic RIGHT maxillary sinus disease. Electronically Signed   By: Franki Cabot M.D.   On: 08/24/2017 21:05   US Pelvis Limited (transabdominal Only)  Result Date: 08/25/2017 CLINICAL DATA:  Inguinal hernia EXAM: LIMITED ULTRASOUND OF PELVIS TECHNIQUE: Limited transabdominal ultrasound examination of the pelvis was performed. COMPARISON:  CT abdomen and pelvis 04/27/2017 FINDINGS: On prior CT patient has a fat containing LEFT inguinal hernia. Prior CT also demonstrates a probable prior RIGHT inguinal hernia repair. On sonography, echogenic masslike focus is identified at the RIGHT inguinal region which may may either represent sequela/deformity as a result of prior RIGHT inguinal herniorrhaphy  or a recurrent RIGHT inguinal hernia. On the LEFT, a heterogeneous focus is seen at the LEFT inguinal canal suspicious for a LEFT inguinal hernia containing fat and fluid. No mass or adenopathy identified. IMPRESSION: Known LEFT inguinal hernia by prior CT. Apparent prior RIGHT inguinal hernia repair. Sonography demonstrates a LEFT inguinal hernia and either postsurgical changes or potential recurrent hernia at the RIGHT inguinal region; further evaluation by a noncontrast CT exam would be beneficial in assessing for potential recurrent RIGHT inguinal hernia. Electronically Signed   By: Lavonia Dana M.D.   On: 08/25/2017 10:16   Ct Femur Right Wo Contrast  Result Date: 08/27/2017 CLINICAL DATA:  Scrotal and right leg swelling. Recent right CFA endarterectomy and stenting 10 days ago. EXAM: CT OF THE LOWER RIGHT EXTREMITY WITHOUT CONTRAST TECHNIQUE: Multidetector CT imaging of the right lower extremity was performed according to the standard protocol. COMPARISON:  None. FINDINGS: Bones/Joint/Cartilage No acute fracture or dislocation. Severe osteoarthritis of the right hip joint. Ligaments Suboptimally assessed by CT. Muscles and Tendons Unremarkable. Soft tissues Right external iliac, right proximal SFA, and distal left SFA stents. Atherosclerotic vascular calcifications. Postsurgical changes in the right groin with small right inguinal fluid collection. Diffuse anasarca. Moderate right and mild left thigh circumferential soft tissue swelling. Severe scrotal and penile edema. IMPRESSION: 1. Postsurgical changes in the right groin with small right inguinal simple appearing fluid collection, favoring seroma. 2. Diffuse anasarca.  Severe scrotal edema. 3. Severe right hip osteoarthritis.  No acute osseous abnormality. Electronically Signed   By: Titus Dubin M.D.   On: 08/27/2017 12:07     LOS: 7 days   Oren Binet, MD  [facility]  If 7PM-7AM, please contact night-coverage  Please page via  [email or web address] [email or web address]-click on MD name and type text message  08/31/2017, 10:43 AM

## 2017-08-31 NOTE — Progress Notes (Addendum)
   Patient Status: [facility] - In-pt  Assessment and Plan: Patient in need of venous access for dialysis initiation.  As patient is planning to start dialysis today best plan will be to proceed with temporary dialysis catheter placement today and work towards more permanent access as schedule allows.  Consent obtained from patient.   ______________________________________________________________________   History of Present Illness: Aaron [name] is a 62 y.o. male with past medical history of Stage IV kidney disease s/p recent superficial femoral artery endarterectomy with pate angioplasty and stent placement.  He is now admitted to [facility] for worsening renal failure in need of dialysis.   Allergies and medications reviewed.   Review of Systems: A 12 point ROS discussed and pertinent positives are indicated in the HPI above.  All other systems are negative.  Review of Systems  Constitutional: Negative for fatigue and fever.  Respiratory: Negative for cough and shortness of breath.   Cardiovascular: Negative for chest pain.  Gastrointestinal: Negative for abdominal pain.  Skin:       edema  Psychiatric/Behavioral: Negative for behavioral problems and confusion.    Vital Signs: BP (!) 150/98 (BP Location: Right Arm)   Pulse (!) 59   Temp 98.3 F (36.8 C)   Resp (!) 24   Ht 6\' 3"  (1.905 m)   Wt 211 lb 8 oz (95.9 kg)   SpO2 99%   BMI 26.44 kg/m   Physical Exam  Constitutional: He is oriented to person, place, and time. He appears well-developed.  Neck: Normal range of motion. Neck supple.  Cardiovascular: Normal rate and regular rhythm.  Pulmonary/Chest: Effort normal. No respiratory distress. He has no wheezes.  Lymphadenopathy:    He has no cervical adenopathy.  Neurological: He is alert and oriented to person, place, and time.  Skin: Skin is warm and dry.  Psychiatric: He has a normal mood and affect. His behavior is normal. Judgment and thought content normal.  Nursing  note and vitals reviewed.    Imaging reviewed.   Labs:  COAGS: Recent Labs    08/05/17 1646  08/14/17 0845 08/24/17 1808 08/30/17 0411 08/31/17 0427  INR 2.56   < > 1.05 1.19 0.92 0.94  APTT 43*  --  32  --   --   --    < > = values in this interval not displayed.    BMP: Recent Labs    08/28/17 1443 08/29/17 0331 08/30/17 0411 08/31/17 0427  NA 135 135 131* 131*  K 5.8* 5.5* 4.8 4.1  CL 97* 95* 91* 88*  CO2 24 25 24 25   GLUCOSE 138* 94 102* 98  BUN 66* 67* 78* 82*  CALCIUM 7.8* 7.8* 7.7* 7.7*  CREATININE 6.46* 5.92* 6.29* 6.25*  GFRNONAA 8* 9* 9* 9*  GFRAA 10* 11* 10* 10*       Electronically Signed: Docia Barrier, PA 08/31/2017, 3:09 PM   I spent a total of 15 minutes in face to face in clinical consultation, greater than 50% of which was counseling/coordinating care for venous access.

## 2017-08-31 NOTE — Progress Notes (Addendum)
Dr. Joelyn Oms rescheduled  5/20 HD tx for 5/21 using the same HD orders for 5/20 tx already modified in Epic. Floor notified

## 2017-08-31 NOTE — Procedures (Signed)
RIJV HD cath SVC RA EBL 0 Comp 0

## 2017-08-31 NOTE — Progress Notes (Signed)
[facility]  Subjective  - Doing better this am over all.  Still has edema in B LE and scrotal.     Objective (!) 150/98 (!) 59 98.3 F (36.8 C) (!) 24 99%  Intake/Output Summary (Last 24 hours) at 08/31/2017 0743 Last data filed at 08/31/2017 0518 Gross per 24 hour  Intake 406 ml  Output 1750 ml  Net -1344 ml    Right groin incision healing well Positive LE edema, feet warm well perfused  B UE with palpable radial pulses Heart RRR Lungs non labored breathing  Assessment/Planning: POD # Right Ilio-femoral endarterectomy Plan for [facility] today Pending vein mapping for future access placement.  He is right hand dominant.  Will save left UE for now.   Roxy Horseman 08/31/2017 7:43 AM --  Laboratory Lab Results: Recent Labs    08/30/17 0411 08/31/17 0427  WBC 5.8 6.2  HGB 9.1* 9.2*  HCT 27.8* 27.6*  PLT 223 235   BMET Recent Labs    08/30/17 0411 08/31/17 0427  NA 131* 131*  K 4.8 4.1  CL 91* 88*  CO2 24 25  GLUCOSE 102* 98  BUN 78* 82*  CREATININE 6.29* 6.25*  CALCIUM 7.7* 7.7*    COAG Lab Results  Component Value Date   INR 0.94 08/31/2017   INR 0.92 08/30/2017   INR 1.19 08/24/2017   No results found for: PTT

## 2017-08-31 NOTE — Progress Notes (Addendum)
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 iliac stenting who was admitted on 08/24/2017 with signif swelling to area including scrotum, worsening of renal function. Aromas placed 08/31/17 IR for HD after failing medical management for volume, and for worsening of CKD5.   Assessment/Plan: 1. 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 Advanced at baseline, creatinine 5.01 on 08/15/17. CKD worsened in the setting of contrast and stenting - not uremic -  no acute indications for HD but volume an issue.  Kidney numbers about same creatinine mid to high 6's. Has failed medical management. In agreement to proceed with HD.  1. Had Celoron placed today 5/20 by IR  2. Is to get HD#1 today hopefully (high patient census may delay).  3. I will consult [facility] tomorrow for permanent access placement 4. CLIP process 2. Volume overload- good amount of urine 1750 past 24 hours, though requiring lasix IV plus metolazone. ? if better output or just better recording.   1. Will keep the diuretics on board until HD initiated. 3. Anemia- decreasing- check iron stores and add ESA 4. Hyperkalemia- gave veltassa, resolved. No meds now for this. On K restricted renal diet. This should not be an issue once starts HD 5. Hyperphosphatemia - started binder. PTH still pending.  6. PAD - s/p revasc procedure 08/14/17. VVS following.    Subjective:   Making more urine (r maybe just recorded better) Up walking in the hall, some SOB not much Edema no different Says "gonna get my treatment (dialysis) about 6"  Objective Vital signs in last 24 hours: Vitals:   08/30/17 1405 08/30/17 2003 08/31/17 0516 08/31/17 0831  BP: 124/79 126/84 (!) 150/98   Pulse: (!) 52 82 (!) 59   Resp: 17 (!) 30 (!) 24   Temp: 97.8 F (36.6 C) 98.3 F (36.8 C) 98.3 F (36.8 C)   TempSrc: Oral Oral    SpO2: 99%  95% 99% 99%  Weight:   95.9 kg (211 lb 8 oz)   Height:       Weight change: -2.54 kg (-5 lb 9.6 oz)  Intake/Output Summary (Last 24 hours) at 08/31/2017 1650 Last data filed at 08/31/2017 1430 Gross per 24 hour  Intake 306 ml  Output 2900 ml  Net -2594 ml   Physical Exam: General NAD walking down the hall with a walker when I came to see VS as noted, UOP also up Regular S1S2 No S3 BS diminished at bases, clear anteriorly Abdomen distended, non tender; + abdominal wall edema  Evidence of previous surg- incision in groin Generalized LE edema pitting bilateral   Recent Labs  Lab 08/28/17 1443 08/29/17 0331 08/30/17 0411 08/31/17 0427  NA 135 135 131* 131*  K 5.8* 5.5* 4.8 4.1  CL 97* 95* 91* 88*  CO2 24 25 24 25   GLUCOSE 138* 94 102* 98  BUN 66* 67* 78* 82*  CREATININE 6.46* 5.92* 6.29* 6.25*  CALCIUM 7.8* 7.8* 7.7* 7.7*  PHOS 9.1*  --   --  10.9*    Recent Labs  Lab 08/24/17 1808 08/28/17 1443 08/31/17 0427  AST 17  --   --   ALT 7*  --   --   ALKPHOS 93  --   --   BILITOT 0.4  --   --   PROT 6.2*  --   --   ALBUMIN  2.9* 3.1* 3.2*    Recent Labs  Lab 08/24/17 1808 08/24/17 2253 08/28/17 0416 08/30/17 0411 08/31/17 0427  WBC 9.4 8.5 6.8 5.8 6.2  NEUTROABS 7.3 6.2 5.1  --   --   HGB 9.4* 10.0* 9.6* 9.1* 9.2*  HCT 29.3* 31.3* 30.2* 27.8* 27.6*  MCV 100.0 101.6* 101.0* 97.2 95.8  PLT 222 247 225 223 235    Recent Labs  Lab 08/27/17 0422 08/28/17 0611 08/29/17 0631 08/30/17 0622 08/31/17 0622  GLUCAP 109* 127* 106* 103* 110*   Iron/TIBC/Ferritin/ %Sat    Component Value Date/Time   IRON 45 08/31/2017 0427   TIBC 232 (L) 08/31/2017 0427   IRONPCTSAT 19 08/31/2017 0427   Studies/Results: Ir Fluoro Guide Cv Line Right  Result Date: 08/31/2017 INDICATION: Acute renal failure EXAM: TUNNELED DIALYSIS CATHETER PLACEMENT, ULTRASOUND GUIDANCE FOR VASCULAR ACCESS MEDICATIONS: None ANESTHESIA/SEDATION: None FLUOROSCOPY TIME:  Fluoroscopy Time:   minutes 6 seconds (0 mGy). COMPLICATIONS: None immediate. PROCEDURE: Informed written consent was obtained from the patient after a thorough discussion of the procedural risks, benefits and alternatives. All questions were addressed. Maximal Sterile Barrier Technique was utilized including caps, mask, sterile gowns, sterile gloves, sterile drape, hand hygiene and skin antiseptic. A timeout was performed prior to the initiation of the procedure. The right neck was prepped with ChloraPrep in a sterile fashion, and a sterile drape was applied covering the operative field. A sterile gown and sterile gloves were used for the procedure. 1% lidocaine into the skin and subcutaneous tissue. The right jugular vein was noted to be patent initially with ultrasound. Under sonographic guidance, a micropuncture needle was inserted into the right IJ vein (Ultrasound and fluoroscopic image documentation was performed). It was removed over an 018 wire which was up-sized to an Amplatz. This was advanced into the IVC. The 12 French dilator followed by the 12 [place] temporary dialysis catheter was advanced over the wire. It was flushed and sewn in place with an 0 Prolene knot. FINDINGS: The tip of the temporary dialysis catheter is at the cavoatrial junction. IMPRESSION: Successful right IJ vein temporary dialysis catheter with its tip at the cavoatrial junction. Electronically Signed   By: Marybelle Killings M.D.   On: 08/31/2017 16:09   Ir US Guide Vasc Access Right  Result Date: 08/31/2017 INDICATION: Acute renal failure EXAM: TUNNELED DIALYSIS CATHETER PLACEMENT, ULTRASOUND GUIDANCE FOR VASCULAR ACCESS MEDICATIONS: None ANESTHESIA/SEDATION: None FLUOROSCOPY TIME:  Fluoroscopy Time:  minutes 6 seconds (0 mGy). COMPLICATIONS: None immediate. PROCEDURE: Informed written consent was obtained from the patient after a thorough discussion of the procedural risks, benefits and alternatives. All questions were addressed. Maximal Sterile  Barrier Technique was utilized including caps, mask, sterile gowns, sterile gloves, sterile drape, hand hygiene and skin antiseptic. A timeout was performed prior to the initiation of the procedure. The right neck was prepped with ChloraPrep in a sterile fashion, and a sterile drape was applied covering the operative field. A sterile gown and sterile gloves were used for the procedure. 1% lidocaine into the skin and subcutaneous tissue. The right jugular vein was noted to be patent initially with ultrasound. Under sonographic guidance, a micropuncture needle was inserted into the right IJ vein (Ultrasound and fluoroscopic image documentation was performed). It was removed over an 018 wire which was up-sized to an Amplatz. This was advanced into the IVC. The 12 French dilator followed by the 12 [place] temporary dialysis catheter was advanced over the wire. It was flushed and sewn in place with an 0  Prolene knot. FINDINGS: The tip of the temporary dialysis catheter is at the cavoatrial junction. IMPRESSION: Successful right IJ vein temporary dialysis catheter with its tip at the cavoatrial junction. Electronically Signed   By: Marybelle Killings M.D.   On: 08/31/2017 16:09   Medications: Infusions: . furosemide Stopped (08/31/17 1222)    Scheduled Medications: . calcium acetate  1,334 mg Oral TID WC  . carvedilol  12.5 mg Oral BID  . cephALEXin  250 mg Oral Q12H  . cholecalciferol  2,000 Units Oral Daily  . clopidogrel  75 mg Oral Daily  . darbepoetin (ARANESP) injection - DIALYSIS  100 mcg Intravenous Q Mon-HD  . fluticasone furoate-vilanterol  1 puff Inhalation Daily  . heparin  5,000 Units Subcutaneous Q8H  . lidocaine      . mouth rinse  15 mL Mouth Rinse BID  . metolazone  5 mg Oral Daily  . pantoprazole  40 mg Oral BID  . Warfarin - Pharmacist Dosing Inpatient   Does not apply q1800   . furosemide Stopped (08/31/17 1222)   Jamal Maes, MD [facility] [phone]  Pager 08/31/2017, 5:00 PM

## 2017-08-31 NOTE — Progress Notes (Signed)
Pt plan to get a temporary catheter placed for dialysis today. Per IR pt can eat at this time.   Ara Kussmaul BSN, RN

## 2017-08-31 NOTE — Care Management Important Message (Signed)
Important Message  Patient Details  Name: Aaron [name] MRN: [id number] Date of Birth: July 07, 1955   Medicare Important Message Given:  Yes    Darolyn Double [address] 08/31/2017, 3:06 PM

## 2017-09-01 ENCOUNTER — Inpatient Hospital Stay (HOSPITAL_COMMUNITY): Payer: 59

## 2017-09-01 DIAGNOSIS — Z992 Dependence on renal dialysis: Secondary | ICD-10-CM

## 2017-09-01 DIAGNOSIS — N186 End stage renal disease: Secondary | ICD-10-CM

## 2017-09-01 LAB — RENAL FUNCTION PANEL
ALBUMIN: 3.3 g/dL — AB (ref 3.5–5.0)
Anion gap: 20 — ABNORMAL HIGH (ref 5–15)
BUN: 89 mg/dL — ABNORMAL HIGH (ref 6–20)
CO2: 24 mmol/L (ref 22–32)
CREATININE: 6.55 mg/dL — AB (ref 0.61–1.24)
Calcium: 7.3 mg/dL — ABNORMAL LOW (ref 8.9–10.3)
Chloride: 88 mmol/L — ABNORMAL LOW (ref 101–111)
GFR calc Af Amer: 9 mL/min — ABNORMAL LOW (ref >60)
GFR, EST NON AFRICAN AMERICAN: 8 mL/min — AB (ref >60)
GLUCOSE: 103 mg/dL — AB (ref 65–99)
PHOSPHORUS: 10.9 mg/dL — AB (ref 2.5–4.6)
POTASSIUM: 4.5 mmol/L (ref 3.5–5.1)
Sodium: 132 mmol/L — ABNORMAL LOW (ref 135–145)

## 2017-09-01 LAB — CBC
HEMATOCRIT: 27.8 % — AB (ref 39.0–52.0)
Hemoglobin: 9.2 g/dL — ABNORMAL LOW (ref 13.0–17.0)
MCH: 31.9 pg (ref 26.0–34.0)
MCHC: 33.1 g/dL (ref 30.0–36.0)
MCV: 96.5 fL (ref 78.0–100.0)
PLATELETS: 265 10*3/uL (ref 150–400)
RBC: 2.88 MIL/uL — ABNORMAL LOW (ref 4.22–5.81)
RDW: 14.1 % (ref 11.5–15.5)
WBC: 7.7 10*3/uL (ref 4.0–10.5)

## 2017-09-01 LAB — PROTIME-INR

## 2017-09-01 LAB — GLUCOSE, CAPILLARY: Glucose-Capillary: 125 mg/dL — ABNORMAL HIGH (ref 65–99)

## 2017-09-01 LAB — PARATHYROID HORMONE, INTACT (NO CA): PTH: 269 pg/mL — AB (ref 15–65)

## 2017-09-01 MED ORDER — LIDOCAINE-PRILOCAINE 2.5-2.5 % EX CREA
1.0000 "application " | TOPICAL_CREAM | CUTANEOUS | Status: DC | PRN
  Filled 2017-09-01: qty 5

## 2017-09-01 MED ORDER — ALBUMIN HUMAN 25 % IV SOLN
INTRAVENOUS | Status: AC
  Administered 2017-09-01: 12.5 g via INTRAVENOUS
  Filled 2017-09-01: qty 50

## 2017-09-01 MED ORDER — HEPARIN SODIUM (PORCINE) 1000 UNIT/ML DIALYSIS
1000.0000 [IU] | INTRAMUSCULAR | Status: DC | PRN
  Filled 2017-09-01: qty 1

## 2017-09-01 MED ORDER — DARBEPOETIN ALFA 100 MCG/0.5ML IJ SOSY
PREFILLED_SYRINGE | INTRAMUSCULAR | Status: AC
  Filled 2017-09-01: qty 0.5

## 2017-09-01 MED ORDER — PHYTONADIONE 5 MG PO TABS
5.0000 mg | ORAL_TABLET | Freq: Once | ORAL | Status: AC
  Administered 2017-09-01: 5 mg via ORAL
  Filled 2017-09-01: qty 1

## 2017-09-01 MED ORDER — SALINE SPRAY 0.65 % NA SOLN
1.0000 | NASAL | Status: DC | PRN
  Administered 2017-09-01: 1 via NASAL
  Filled 2017-09-01: qty 44

## 2017-09-01 MED ORDER — ALBUMIN HUMAN 25 % IV SOLN
12.5000 g | Freq: Once | INTRAVENOUS | Status: AC
  Administered 2017-09-01: 12.5 g via INTRAVENOUS

## 2017-09-01 MED ORDER — SODIUM CHLORIDE 0.9 % IV SOLN
100.0000 mL | INTRAVENOUS | Status: DC | PRN
  Administered 2017-09-04 (×2): via INTRAVENOUS

## 2017-09-01 MED ORDER — SODIUM CHLORIDE 0.9 % IV SOLN
125.0000 mg | Freq: Every day | INTRAVENOUS | Status: AC
  Administered 2017-09-01 – 2017-09-04 (×4): 125 mg via INTRAVENOUS
  Filled 2017-09-01 (×7): qty 10

## 2017-09-01 MED ORDER — ALTEPLASE 2 MG IJ SOLR: 2.0000 mg | Freq: Once | INTRAMUSCULAR | Status: DC | PRN

## 2017-09-01 MED ORDER — DOUBLE ANTIBIOTIC 500-10000 UNIT/GM EX OINT
TOPICAL_OINTMENT | Freq: Two times a day (BID) | CUTANEOUS | Status: DC
  Administered 2017-09-02 – 2017-09-05 (×8): via TOPICAL
  Filled 2017-09-01 (×2): qty 28.4

## 2017-09-01 MED ORDER — LIDOCAINE HCL (PF) 1 % IJ SOLN: 5.0000 mL | INTRAMUSCULAR | Status: DC | PRN

## 2017-09-01 MED ORDER — SODIUM CHLORIDE 0.9 % IV SOLN: 100.0000 mL | INTRAVENOUS | Status: DC | PRN

## 2017-09-01 MED ORDER — PENTAFLUOROPROP-TETRAFLUOROETH EX AERO: 1.0000 "application " | INHALATION_SPRAY | CUTANEOUS | Status: DC | PRN

## 2017-09-01 MED ORDER — DARBEPOETIN ALFA 100 MCG/0.5ML IJ SOSY: 100.0000 ug | PREFILLED_SYRINGE | INTRAMUSCULAR | Status: DC

## 2017-09-01 NOTE — Procedures (Signed)
I have personally attended this patient's dialysis session.   HD#1 for new ESRD R IJ TDC 2K bath To get Aranesp, ferrlecit with HD UF goal 1 liter, BP has already declined  Jamal Maes, MD [place] Pager 09/01/2017, 8:00 AM

## 2017-09-01 NOTE — Progress Notes (Signed)
Bilateral Upper Extremity Vein Map  Right  Upper Extremity Vein Map  Cephalic  Segment Diameter Depth Comment  1. Axilla 3.8 mm 5.3 mm   2. Mid upper arm 3.7 mm 1.7 mm Branch  3. Above AC 3.0 mm 2.5 mm Branch  4. In AC 4.8 mm 1.8 mm Branch  5. Below AC 3.2 mm 3.0 mm Branch  6. Mid forearm 1.5 mm 2.0 mm Branch  7. Wrist 2.2 mm 1.6 mm    Left  Upper Extremity Vein Map  Cephalic  Segment Diameter Depth Comment  1. Axilla 4.0 mm 4.2 mm   2. Mid upper arm 2.9 mm 2.4 mm   3. Above AC 1.6 mm 1.7 mm   4. In AC 3.8 mm 3.4 mm   5. Below AC 3.5 mm 1.4 mm Branch  6. Mid forearm 3.5 mm 1.6 mm Branch  7. Wrist 2.2 mm 1.8 mm    09/01/17 4:15 PM Carlos Levering RVT

## 2017-09-01 NOTE — Progress Notes (Signed)
    Subjective  -   Walking better C/o opening on penis HD today  Physical Exam:  Still with edema but improved       Assessment/Plan:    Will add triple Abx ointment for penile abrasion Vein mapping pending Looks like IR place a temp cath.  This will need to be converted to perm cath.  WIll try to arrange at time of fistula placement  Aaron [name] 09/01/2017 10:19 PM --  Vitals:   09/01/17 1337 09/01/17 1926  BP: (!) 134/98 (!) 142/82  Pulse:  60  Resp:  18  Temp:  98.4 F (36.9 C)  SpO2:  97%    Intake/Output Summary (Last 24 hours) at 09/01/2017 2219 Last data filed at 09/01/2017 9323 Gross per 24 hour  Intake 432 ml  Output 1725 ml  Net -1293 ml     Laboratory CBC    Component Value Date/Time   WBC 7.7 09/01/2017 0407   HGB 9.2 (L) 09/01/2017 0407   HCT 27.8 (L) 09/01/2017 0407   PLT 265 09/01/2017 0407    BMET    Component Value Date/Time   NA 132 (L) 09/01/2017 0407   K 4.5 09/01/2017 0407   CL 88 (L) 09/01/2017 0407   CO2 24 09/01/2017 0407   GLUCOSE 103 (H) 09/01/2017 0407   BUN 89 (H) 09/01/2017 0407   CREATININE 6.55 (H) 09/01/2017 0407   CALCIUM 7.3 (L) 09/01/2017 0407   GFRNONAA 8 (L) 09/01/2017 0407   GFRAA 9 (L) 09/01/2017 0407    COAG Lab Results  Component Value Date   INR >10.00 (HH) 09/01/2017   INR 0.94 08/31/2017   INR 0.92 08/30/2017   No results found for: PTT  Antibiotics Anti-infectives (From admission, onward)   Start     Dose/Rate Route Frequency Ordered Stop   08/26/17 1300  cephALEXin (KEFLEX) capsule 250 mg     250 mg Oral Every 12 hours 08/26/17 1132     08/25/17 1000  ceFAZolin (ANCEF) IVPB 1 g/50 mL premix  Status:  Discontinued     1 g 100 mL/hr over 30 Minutes Intravenous Every 12 hours 08/24/17 2223 08/26/17 1132   08/24/17 1915  ceFAZolin (ANCEF) IVPB 1 g/50 mL premix     1 g 100 mL/hr over 30 Minutes Intravenous  Once 08/24/17 1911 08/24/17 2120       V. Leia Alf, M.D. [facility] Office: [phone] Pager:  [phone]

## 2017-09-01 NOTE — Progress Notes (Addendum)
Patient complaining of pain in scrotum and penis. Patient scrotum is swollen and the epidiymis is red and irritated. Patient is insistent on getting an ointment to put on the irritated area. Called provider, Ghimire and he suggested that I call vascular,  called PA, [name] and awaiting return call.

## 2017-09-01 NOTE — Progress Notes (Signed)
PROGRESS NOTE        PATIENT DETAILS Name: Aaron [name] Age: 62 y.o. Sex: male Date of Birth: [birth year] Admit Date: 08/24/2017 Admitting Physician Reubin Milan, MD [id number], [place]  Brief Narrative: Patient is a 62 y.o. male with history of stage IV/stage V chronic kidney disease-who underwent lower extremity angiography on 4/34 critical limb ischemia and a right external iliac, common femoral, superficial femoral artery endarterectomy with patch angioplasty and placement of a stent on 5/3-admitted with anasarca in the setting of worsening renal function due to probable contrast nephropathy.  See below for further details  Subjective: Still has significant lower extremity edema/scrotal edema.  No chest pain or shortness of breath.  Assessment/Plan: Anasarca secondary to worsening renal failure due to probable contrast-induced nephropathy-with likely progression to ESRD: Continue to have significant amount of anasarca in spite of being on high-dose diuretics, subsequently started on hemodialysis on 5/21.  Nephrology following.  Paroxysmal atrial fibrillation: Appears to be in sinus rhythm-continue Coreg, resume Coumadin once all vascular procedures have been completed.   Peripheral arterial disease: As noted above-on 5/3 underwent endarterectomy of femoral, profundofemoral, SFA and stent placement in right common iliac artery, right external iliac artery, and right superficial femoral artery-remains on Plavix.  Vascular surgery following  Hypertension: Controlled with Coreg, metolazone and Lasix.  Anemia: Secondary to chronic kidney disease, continue darbepoetin per nephrology.  gy.   GERD: Continue PPI  History of polycystic kidney disease: See above  DVT Prophylaxis: Prophylactic Heparin  Code Status: Full code   Family Communication: None at bedside  Disposition Plan: Remain inpatient-will require several more  days of hospitalization prior to discharge  Antimicrobial agents: Anti-infectives (From admission, onward)   Start     Dose/Rate Route Frequency Ordered Stop   08/26/17 1300  cephALEXin (KEFLEX) capsule 250 mg     250 mg Oral Every 12 hours 08/26/17 1132     08/25/17 1000  ceFAZolin (ANCEF) IVPB 1 g/50 mL premix  Status:  Discontinued     1 g 100 mL/hr over 30 Minutes Intravenous Every 12 hours 08/24/17 2223 08/26/17 1132   08/24/17 1915  ceFAZolin (ANCEF) IVPB 1 g/50 mL premix     1 g 100 mL/hr over 30 Minutes Intravenous  Once 08/24/17 1911 08/24/17 2120      Procedures: None  CONSULTS:  nephrology and vascular surgery  Time spent: 25 minutes-Greater than 50% of this time was spent in counseling, explanation of diagnosis, planning of further management, and coordination of care.  MEDICATIONS: Scheduled Meds: . calcium acetate  1,334 mg Oral TID WC  . carvedilol  12.5 mg Oral BID  . cephALEXin  250 mg Oral Q12H  . cholecalciferol  2,000 Units Oral Daily  . clopidogrel  75 mg Oral Daily  . darbepoetin (ARANESP) injection - DIALYSIS  100 mcg Intravenous Q Tue-HD  . fluticasone furoate-vilanterol  1 puff Inhalation Daily  . heparin  5,000 Units Subcutaneous Q8H  . mouth rinse  15 mL Mouth Rinse BID  . metolazone  5 mg Oral Daily  . pantoprazole  40 mg Oral BID  . Warfarin - Pharmacist Dosing Inpatient   Does not apply q1800   Continuous Infusions: . sodium chloride    . sodium chloride    . ferric gluconate (FERRLECIT/NULECIT) IV Stopped (09/01/17 1042)  . furosemide Stopped (09/01/17 0717)  PRN Meds:.sodium chloride, sodium chloride, alteplase, heparin, HYDROmorphone (DILAUDID) injection, lidocaine (PF), lidocaine-prilocaine, ondansetron **OR** ondansetron (ZOFRAN) IV, oxyCODONE, pentafluoroprop-tetrafluoroeth, sodium chloride   PHYSICAL EXAM: Vital signs: Vitals:   09/01/17 0915 09/01/17 0930 09/01/17 0936 09/01/17 1337  BP: 129/77 127/79 127/79 (!) 134/98    Pulse: (!) 53 (!) 54 (!) 47   Resp: 13 11 19    Temp:   (!) 97.2 F (36.2 C)   TempSrc:   Oral   SpO2: 96% 93% 97%   Weight:      Height:       Filed Weights   08/31/17 0516 09/01/17 0409 09/01/17 0740  Weight: 95.9 kg (211 lb 8 oz) 95.3 kg (210 lb 3.2 oz) 94.7 kg (208 lb 12.4 oz)   Body mass index is 26.1 kg/m.   General appearance:Awake, alert, not in any distress.  Eyes:no scleral icterus. HEENT: Atraumatic and Normocephalic Neck: supple, no JVD. Resp:Good air entry bilaterally,no rales or rhonchi CVS: S1 S2 regular, no murmurs.  GI: Bowel sounds present, Non tender and not distended with no gaurding, rigidity or rebound. Extremities: B/L Lower Ext shows +++ edema, both legs are warm to touch Neurology:  Non focal Psychiatric: Normal judgment and insight. Normal mood. Musculoskeletal:No digital cyanosis Skin:No Rash, warm and dry Wounds:N/A  I have personally reviewed following labs and imaging studies  LABORATORY DATA: CBC: Recent Labs  Lab 08/28/17 0416 08/30/17 0411 08/31/17 0427 09/01/17 0407  WBC 6.8 5.8 6.2 7.7  NEUTROABS 5.1  --   --   --   HGB 9.6* 9.1* 9.2* 9.2*  HCT 30.2* 27.8* 27.6* 27.8*  MCV 101.0* 97.2 95.8 96.5  PLT 225 223 235 001    Basic Metabolic Panel: Recent Labs  Lab 08/28/17 1443 08/29/17 0331 08/30/17 0411 08/31/17 0427 09/01/17 0407  NA 135 135 131* 131* 132*  K 5.8* 5.5* 4.8 4.1 4.5  CL 97* 95* 91* 88* 88*  CO2 24 25 24 25 24   GLUCOSE 138* 94 102* 98 103*  BUN 66* 67* 78* 82* 89*  CREATININE 6.46* 5.92* 6.29* 6.25* 6.55*  CALCIUM 7.8* 7.8* 7.7* 7.7* 7.3*  PHOS 9.1*  --   --  10.9* 10.9*    GFR: Estimated Creatinine Clearance: 14.2 mL/min (A) (by C-G formula based on SCr of 6.55 mg/dL (H)).  Liver Function Tests: Recent Labs  Lab 08/28/17 1443 08/31/17 0427 09/01/17 0407  ALBUMIN 3.1* 3.2* 3.3*   No results for input(s): LIPASE, AMYLASE in the last 168 hours. No results for input(s): AMMONIA in the last 168  hours.  Coagulation Profile: Recent Labs  Lab 08/30/17 0411 08/31/17 0427 09/01/17 0407  INR 0.92 0.94 >10.00*    Cardiac Enzymes: No results for input(s): CKTOTAL, CKMB, CKMBINDEX, TROPONINI in the last 168 hours.  BNP (last 3 results) No results for input(s): PROBNP in the last 8760 hours.  HbA1C: No results for input(s): HGBA1C in the last 72 hours.  CBG: Recent Labs  Lab 08/28/17 0611 08/29/17 0631 08/30/17 0622 08/31/17 0622 09/01/17 0559  GLUCAP 127* 106* 103* 110* 125*    Lipid Profile: No results for input(s): CHOL, HDL, LDLCALC, TRIG, CHOLHDL, LDLDIRECT in the last 72 hours.  Thyroid Function Tests: No results for input(s): TSH, T4TOTAL, FREET4, T3FREE, THYROIDAB in the last 72 hours.  Anemia Panel: Recent Labs    08/31/17 0427  TIBC 232*  IRON 45    Urine analysis:    Component Value Date/Time   COLORURINE STRAW (A) 08/14/2017 [place]  08/14/2017 0846   LABSPEC 1.006 08/14/2017 0846   PHURINE 5.0 08/14/2017 0846   GLUCOSEU NEGATIVE 08/14/2017 0846   HGBUR SMALL (A) 08/14/2017 0846   BILIRUBINUR NEGATIVE 08/14/2017 0846   KETONESUR NEGATIVE 08/14/2017 0846   PROTEINUR NEGATIVE 08/14/2017 0846   UROBILINOGEN 0.2 03/03/2010 1056   NITRITE NEGATIVE 08/14/2017 0846   LEUKOCYTESUR NEGATIVE 08/14/2017 0846    Sepsis Labs: Lactic Acid, Venous No results found for: LATICACIDVEN  MICROBIOLOGY: No results found for this or any previous visit (from the past 240 hour(s)).  RADIOLOGY STUDIES/RESULTS: Ct Abdomen Pelvis Wo Contrast  Result Date: 08/27/2017 CLINICAL DATA:  Right leg pain and scrotal swelling. EXAM: CT ABDOMEN AND PELVIS WITHOUT CONTRAST TECHNIQUE: Multidetector CT imaging of the abdomen and pelvis was performed following the standard protocol without IV contrast. COMPARISON:  CT scan of April 27, 2017. FINDINGS: Lower chest: Minimal right posterior basilar subsegmental atelectasis is noted. Hepatobiliary: No  gallstones are noted. Stable multiple small hepatic cysts are noted. No biliary dilatation is noted. Pancreas: Unremarkable. No pancreatic ductal dilatation or surrounding inflammatory changes. Spleen: Normal in size without focal abnormality. Adrenals/Urinary Tract: Adrenal glands appear normal. Stable severe polycystic kidney disease is seen bilaterally. No hydronephrosis or renal obstruction is noted. No renal or ureteral calculi are noted. Urinary bladder is unremarkable. Stomach/Bowel: Stomach is within normal limits. Appendix appears normal. No evidence of bowel wall thickening, distention, or inflammatory changes. Sigmoid diverticulosis is noted without inflammation. Vascular/Lymphatic: Aortic atherosclerosis. No enlarged abdominal or pelvic lymph nodes. Stents are noted in the right common and external iliac arteries. Reproductive: Prostate is unremarkable. Other: Mild anasarca is noted. Large amount of fluid is noted in the scrotum. Musculoskeletal: Extensive postsurgical and degenerative changes are seen involving the lumbar spine. No acute abnormality is noted. IMPRESSION: Stable severe polycystic kidney disease is noted, with stable cysts seen throughout hepatic parenchyma most consistent with associated polycystic liver disease. Sigmoid diverticulosis without inflammation. Mild anasarca is noted. Large amount of fluid is noted in the scrotum. Aortic Atherosclerosis (ICD10-I70.0). Electronically Signed   By: Marijo Conception, M.D.   On: 08/27/2017 11:53   Dg Chest 2 View  Result Date: 08/27/2017 CLINICAL DATA:  CHF. Shortness of breath, cough and testicular swelling EXAM: CHEST - 2 VIEW COMPARISON:  Chest x-rays dated 09/22/2014 and 03/01/2013 FINDINGS: Heart size and mediastinal contours are within normal limits. Lungs are hyperexpanded. Lungs are clear. No pleural effusion or pneumothorax seen. Osseous structures about the chest are unremarkable. IMPRESSION: 1. No active cardiopulmonary disease. No  evidence of pneumonia or pulmonary edema. 2. Hyperexpanded lungs indicating COPD. Electronically Signed   By: Franki Cabot M.D.   On: 08/27/2017 20:02   Ct Head Wo Contrast  Result Date: 08/24/2017 CLINICAL DATA:  Fall last night, bilateral diplopia starting today. EXAM: CT HEAD WITHOUT CONTRAST TECHNIQUE: Contiguous axial images were obtained from the base of the skull through the vertex without intravenous contrast. COMPARISON:  Head CT dated 09/22/2014. FINDINGS: Brain: Mild generalized age related parenchymal atrophy with commensurate dilatation of the ventricles and sulci. Ventricles are stable in size and configuration. Again noted is a prominent CSF space adjacent to the RIGHT temporal lobe, compatible with chronic arachnoid cyst. There is no parenchymal mass, hemorrhage or edema appreciated. No extra-axial hemorrhage. Vascular: There are chronic calcified atherosclerotic changes of the large vessels at the skull base. No unexpected hyperdense vessel. Skull: Normal. Negative for fracture or focal lesion. Sinuses/Orbits: Chronic RIGHT maxillary sinus disease. No acute findings. Other: None. IMPRESSION: 1.  No acute findings. No intracranial hemorrhage or edema. No skull fracture. 2. Chronic RIGHT maxillary sinus disease. Electronically Signed   By: Franki Cabot M.D.   On: 08/24/2017 21:05   US Pelvis Limited (transabdominal Only)  Result Date: 08/25/2017 CLINICAL DATA:  Inguinal hernia EXAM: LIMITED ULTRASOUND OF PELVIS TECHNIQUE: Limited transabdominal ultrasound examination of the pelvis was performed. COMPARISON:  CT abdomen and pelvis 04/27/2017 FINDINGS: On prior CT patient has a fat containing LEFT inguinal hernia. Prior CT also demonstrates a probable prior RIGHT inguinal hernia repair. On sonography, echogenic masslike focus is identified at the RIGHT inguinal region which may may either represent sequela/deformity as a result of prior RIGHT inguinal herniorrhaphy or a recurrent RIGHT inguinal  hernia. On the LEFT, a heterogeneous focus is seen at the LEFT inguinal canal suspicious for a LEFT inguinal hernia containing fat and fluid. No mass or adenopathy identified. IMPRESSION: Known LEFT inguinal hernia by prior CT. Apparent prior RIGHT inguinal hernia repair. Sonography demonstrates a LEFT inguinal hernia and either postsurgical changes or potential recurrent hernia at the RIGHT inguinal region; further evaluation by a noncontrast CT exam would be beneficial in assessing for potential recurrent RIGHT inguinal hernia. Electronically Signed   By: Lavonia Dana M.D.   On: 08/25/2017 10:16   Ct Femur Right Wo Contrast  Result Date: 08/27/2017 CLINICAL DATA:  Scrotal and right leg swelling. Recent right CFA endarterectomy and stenting 10 days ago. EXAM: CT OF THE LOWER RIGHT EXTREMITY WITHOUT CONTRAST TECHNIQUE: Multidetector CT imaging of the right lower extremity was performed according to the standard protocol. COMPARISON:  None. FINDINGS: Bones/Joint/Cartilage No acute fracture or dislocation. Severe osteoarthritis of the right hip joint. Ligaments Suboptimally assessed by CT. Muscles and Tendons Unremarkable. Soft tissues Right external iliac, right proximal SFA, and distal left SFA stents. Atherosclerotic vascular calcifications. Postsurgical changes in the right groin with small right inguinal fluid collection. Diffuse anasarca. Moderate right and mild left thigh circumferential soft tissue swelling. Severe scrotal and penile edema. IMPRESSION: 1. Postsurgical changes in the right groin with small right inguinal simple appearing fluid collection, favoring seroma. 2. Diffuse anasarca.  Severe scrotal edema. 3. Severe right hip osteoarthritis.  No acute osseous abnormality. Electronically Signed   By: Titus Dubin M.D.   On: 08/27/2017 12:07   Ir Fluoro Guide Cv Line Right  Result Date: 08/31/2017 INDICATION: Acute renal failure EXAM: TUNNELED DIALYSIS CATHETER PLACEMENT, ULTRASOUND GUIDANCE  FOR VASCULAR ACCESS MEDICATIONS: None ANESTHESIA/SEDATION: None FLUOROSCOPY TIME:  Fluoroscopy Time:  minutes 6 seconds (0 mGy). COMPLICATIONS: None immediate. PROCEDURE: Informed written consent was obtained from the patient after a thorough discussion of the procedural risks, benefits and alternatives. All questions were addressed. Maximal Sterile Barrier Technique was utilized including caps, mask, sterile gowns, sterile gloves, sterile drape, hand hygiene and skin antiseptic. A timeout was performed prior to the initiation of the procedure. The right neck was prepped with ChloraPrep in a sterile fashion, and a sterile drape was applied covering the operative field. A sterile gown and sterile gloves were used for the procedure. 1% lidocaine into the skin and subcutaneous tissue. The right jugular vein was noted to be patent initially with ultrasound. Under sonographic guidance, a micropuncture needle was inserted into the right IJ vein (Ultrasound and fluoroscopic image documentation was performed). It was removed over an 018 wire which was up-sized to an Amplatz. This was advanced into the IVC. The 12 French dilator followed by the 12 [place] temporary dialysis catheter was advanced over the wire. It  was flushed and sewn in place with an 0 Prolene knot. FINDINGS: The tip of the temporary dialysis catheter is at the cavoatrial junction. IMPRESSION: Successful right IJ vein temporary dialysis catheter with its tip at the cavoatrial junction. Electronically Signed   By: Marybelle Killings M.D.   On: 08/31/2017 16:09   Ir US Guide Vasc Access Right  Result Date: 08/31/2017 INDICATION: Acute renal failure EXAM: TUNNELED DIALYSIS CATHETER PLACEMENT, ULTRASOUND GUIDANCE FOR VASCULAR ACCESS MEDICATIONS: None ANESTHESIA/SEDATION: None FLUOROSCOPY TIME:  Fluoroscopy Time:  minutes 6 seconds (0 mGy). COMPLICATIONS: None immediate. PROCEDURE: Informed written consent was obtained from the patient after a thorough discussion of  the procedural risks, benefits and alternatives. All questions were addressed. Maximal Sterile Barrier Technique was utilized including caps, mask, sterile gowns, sterile gloves, sterile drape, hand hygiene and skin antiseptic. A timeout was performed prior to the initiation of the procedure. The right neck was prepped with ChloraPrep in a sterile fashion, and a sterile drape was applied covering the operative field. A sterile gown and sterile gloves were used for the procedure. 1% lidocaine into the skin and subcutaneous tissue. The right jugular vein was noted to be patent initially with ultrasound. Under sonographic guidance, a micropuncture needle was inserted into the right IJ vein (Ultrasound and fluoroscopic image documentation was performed). It was removed over an 018 wire which was up-sized to an Amplatz. This was advanced into the IVC. The 12 French dilator followed by the 12 [place] temporary dialysis catheter was advanced over the wire. It was flushed and sewn in place with an 0 Prolene knot. FINDINGS: The tip of the temporary dialysis catheter is at the cavoatrial junction. IMPRESSION: Successful right IJ vein temporary dialysis catheter with its tip at the cavoatrial junction. Electronically Signed   By: Marybelle Killings M.D.   On: 08/31/2017 16:09     LOS: 8 days   Oren Binet, MD  [facility]  If 7PM-7AM, please contact night-coverage  Please page via [email or web address] [email or web address]-click on MD name and type text message  09/01/2017, 2:09 PM

## 2017-09-01 NOTE — Progress Notes (Signed)
CRITICAL VALUE ALERT  Critical Value:  INR > 10.0  Date & Time Notied:  09/01/17 at 05:55 AM  Provider Notified: Jonn Shingles, NP  Orders Received/Actions taken: Phyonadione (Vitamin K) 5 mg Tablet at 6:30 am.

## 2017-09-01 NOTE — Progress Notes (Signed)
Paged MD from vascular to follow up with patient's extreme swelling to penis and scrotum.  Dr. Scot Dock called back and made aware of patient's complaints. Was told to elevated scrotum when patient is in bed and to notify the attending physician when he makes rounds on tomorrow. MD was also told that previous [facility] physician told previous nurse to notify vascular. Vascular says that this needs to be addressed on rounds tomorrow with attending. Will let patient know and education provided that when patient lays in bed for the night that his scrotum needs to be elevated with sheet or towel per patient's preference.

## 2017-09-01 NOTE — Progress Notes (Signed)
Albumin given per Dr. Buelah Manis verbal order

## 2017-09-01 NOTE — Progress Notes (Signed)
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 iliac stenting who was admitted on 08/24/2017 with signif swelling to area including scrotum, worsening of renal function. [place] placed 08/31/17 IR for HD after failing medical management for volume, and for worsening of CKD5.   Assessment/Plan: 1. 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 Advanced at baseline, creatinine 5.01 on 08/15/17. CKD worsened in the setting of contrast and stenting. Was felt best to proceed with dialysis. Pre HD creatinine today 6.55 1. S/p TDC 5/20 by IR  2. HD#1 today (high patient census yesterday delayed TMT until today) 3. I will consult VVS for permanent access placement; vmap ordered 4. CLIP process started 5. Volume overload- really good UOP past 24 hours. Getting lasix + metolazone. Given response will keep going for the time being. 2. Anemia - Hb 9.2. TSat 19. Ordered 4 doses Ferrlecit. Have added ESA - would have been given yesterday but no HD so change to today with HD.  3. Hyperkalemia- gave veltassa, resolved. No meds now for this. On K restricted renal diet. This should not be an issue once starts HD 4. Hyperphosphatemia - started binder (Ca acetate). PTH still pending.  5. PAD - s/p revasc procedure 08/14/17. VVS following.    Subjective:   Making a lot more urine (or maybe just recorded better) but creatinine continues to [facility] quite a bit last night Edema no different No new complaints  Objective Vital signs in last 24 hours: Vitals:   09/01/17 0419 09/01/17 0740 09/01/17 0741 09/01/17 0745  BP: (!) 158/91 (!) 145/80 (!) 147/116 113/75  Pulse: (!) 56 (!) 55 (!) 55   Resp: 16 (!) 9 16   Temp: 97.7 F (36.5 C) 98.5 F (36.9 C)    TempSrc: Oral Oral    SpO2: 100% 94% 98%   Weight:  94.7 kg (208 lb 12.4 oz)    Height:       Weight change: -0.59 kg (-1 lb  4.8 oz)  Intake/Output Summary (Last 24 hours) at 09/01/2017 0750 Last data filed at 09/01/2017 0641 Gross per 24 hour  Intake 1148 ml  Output 2950 ml  Net -1802 ml   Physical Exam: General NAD seen on HD VS as noted, BP has dropped 086'V->784 systolic at initiation HD Pre weight 94.7 kg Regular S1S2 No S3 BS diminished at bases, clear anteriorly Abdomen distended, non tender; + abdominal wall edema, hematoma L abdomen R groin sore Generalized LE edema pitting bilateral 2+   Recent Labs  Lab 08/28/17 1443  08/30/17 0411 08/31/17 0427 09/01/17 0407  NA 135   < > 131* 131* 132*  K 5.8*   < > 4.8 4.1 4.5  CL 97*   < > 91* 88* 88*  CO2 24   < > 24 25 24   GLUCOSE 138*   < > 102* 98 103*  BUN 66*   < > 78* 82* 89*  CREATININE 6.46*   < > 6.29* 6.25* 6.55*  CALCIUM 7.8*   < > 7.7* 7.7* 7.3*  PHOS 9.1*  --   --  10.9* 10.9*   < > = values in this interval not displayed.    Recent Labs  Lab 08/28/17 1443 08/31/17 0427 09/01/17 0407  ALBUMIN 3.1* 3.2* 3.3*    Recent Labs  Lab 08/28/17 0416 08/30/17 0411 08/31/17 0427 09/01/17 0407  WBC  6.8 5.8 6.2 7.7  NEUTROABS 5.1  --   --   --   HGB 9.6* 9.1* 9.2* 9.2*  HCT 30.2* 27.8* 27.6* 27.8*  MCV 101.0* 97.2 95.8 96.5  PLT 225 223 235 265    Recent Labs  Lab 08/28/17 0611 08/29/17 0631 08/30/17 0622 08/31/17 0622 09/01/17 0559  GLUCAP 127* 106* 103* 110* 125*   Iron/TIBC/Ferritin/ %Sat    Component Value Date/Time   IRON 45 08/31/2017 0427   TIBC 232 (L) 08/31/2017 0427   IRONPCTSAT 19 08/31/2017 0427   Studies/Results: Ir Fluoro Guide Cv Line Right  Result Date: 08/31/2017 INDICATION: Acute renal failure EXAM: TUNNELED DIALYSIS CATHETER PLACEMENT, ULTRASOUND GUIDANCE FOR VASCULAR ACCESS MEDICATIONS: None ANESTHESIA/SEDATION: None FLUOROSCOPY TIME:  Fluoroscopy Time:  minutes 6 seconds (0 mGy). COMPLICATIONS: None immediate. PROCEDURE: Informed written consent was obtained from the patient after a thorough  discussion of the procedural risks, benefits and alternatives. All questions were addressed. Maximal Sterile Barrier Technique was utilized including caps, mask, sterile gowns, sterile gloves, sterile drape, hand hygiene and skin antiseptic. A timeout was performed prior to the initiation of the procedure. The right neck was prepped with ChloraPrep in a sterile fashion, and a sterile drape was applied covering the operative field. A sterile gown and sterile gloves were used for the procedure. 1% lidocaine into the skin and subcutaneous tissue. The right jugular vein was noted to be patent initially with ultrasound. Under sonographic guidance, a micropuncture needle was inserted into the right IJ vein (Ultrasound and fluoroscopic image documentation was performed). It was removed over an 018 wire which was up-sized to an Amplatz. This was advanced into the IVC. The 12 French dilator followed by the 12 [place] temporary dialysis catheter was advanced over the wire. It was flushed and sewn in place with an 0 Prolene knot. FINDINGS: The tip of the temporary dialysis catheter is at the cavoatrial junction. IMPRESSION: Successful right IJ vein temporary dialysis catheter with its tip at the cavoatrial junction. Electronically Signed   By: Marybelle Killings M.D.   On: 08/31/2017 16:09   Ir US Guide Vasc Access Right  Result Date: 08/31/2017 INDICATION: Acute renal failure EXAM: TUNNELED DIALYSIS CATHETER PLACEMENT, ULTRASOUND GUIDANCE FOR VASCULAR ACCESS MEDICATIONS: None ANESTHESIA/SEDATION: None FLUOROSCOPY TIME:  Fluoroscopy Time:  minutes 6 seconds (0 mGy). COMPLICATIONS: None immediate. PROCEDURE: Informed written consent was obtained from the patient after a thorough discussion of the procedural risks, benefits and alternatives. All questions were addressed. Maximal Sterile Barrier Technique was utilized including caps, mask, sterile gowns, sterile gloves, sterile drape, hand hygiene and skin antiseptic. A timeout was  performed prior to the initiation of the procedure. The right neck was prepped with ChloraPrep in a sterile fashion, and a sterile drape was applied covering the operative field. A sterile gown and sterile gloves were used for the procedure. 1% lidocaine into the skin and subcutaneous tissue. The right jugular vein was noted to be patent initially with ultrasound. Under sonographic guidance, a micropuncture needle was inserted into the right IJ vein (Ultrasound and fluoroscopic image documentation was performed). It was removed over an 018 wire which was up-sized to an Amplatz. This was advanced into the IVC. The 12 French dilator followed by the 12 [place] temporary dialysis catheter was advanced over the wire. It was flushed and sewn in place with an 0 Prolene knot. FINDINGS: The tip of the temporary dialysis catheter is at the cavoatrial junction. IMPRESSION: Successful right IJ vein temporary dialysis catheter with its tip  at the cavoatrial junction. Electronically Signed   By: Marybelle Killings M.D.   On: 08/31/2017 16:09   Medications: . sodium chloride    . sodium chloride    . furosemide Stopped (09/01/17 0717)    Scheduled Medications: . calcium acetate  1,334 mg Oral TID WC  . carvedilol  12.5 mg Oral BID  . cephALEXin  250 mg Oral Q12H  . cholecalciferol  2,000 Units Oral Daily  . clopidogrel  75 mg Oral Daily  . darbepoetin (ARANESP) injection - DIALYSIS  100 mcg Intravenous Q Mon-HD  . fluticasone furoate-vilanterol  1 puff Inhalation Daily  . heparin  5,000 Units Subcutaneous Q8H  . mouth rinse  15 mL Mouth Rinse BID  . metolazone  5 mg Oral Daily  . pantoprazole  40 mg Oral BID  . Warfarin - Pharmacist Dosing Inpatient   Does not apply Boyce, MD [place] [phone] Pager 09/01/2017, 7:50 AM

## 2017-09-02 ENCOUNTER — Inpatient Hospital Stay (HOSPITAL_COMMUNITY): Payer: 59

## 2017-09-02 ENCOUNTER — Other Ambulatory Visit: Payer: Self-pay | Admitting: *Deleted

## 2017-09-02 DIAGNOSIS — E8770 Fluid overload, unspecified: Secondary | ICD-10-CM

## 2017-09-02 DIAGNOSIS — M7989 Other specified soft tissue disorders: Secondary | ICD-10-CM

## 2017-09-02 DIAGNOSIS — M79609 Pain in unspecified limb: Secondary | ICD-10-CM

## 2017-09-02 LAB — RENAL FUNCTION PANEL
ALBUMIN: 3.1 g/dL — AB (ref 3.5–5.0)
ANION GAP: 15 (ref 5–15)
BUN: 65 mg/dL — AB (ref 6–20)
CO2: 27 mmol/L (ref 22–32)
CREATININE: 5.4 mg/dL — AB (ref 0.61–1.24)
Calcium: 7.5 mg/dL — ABNORMAL LOW (ref 8.9–10.3)
Chloride: 91 mmol/L — ABNORMAL LOW (ref 101–111)
GFR, EST AFRICAN AMERICAN: 12 mL/min — AB (ref >60)
GFR, EST NON AFRICAN AMERICAN: 10 mL/min — AB (ref >60)
GLUCOSE: 112 mg/dL — AB (ref 65–99)
Phosphorus: 7.3 mg/dL — ABNORMAL HIGH (ref 2.5–4.6)
Potassium: 3.6 mmol/L (ref 3.5–5.1)
Sodium: 133 mmol/L — ABNORMAL LOW (ref 135–145)

## 2017-09-02 LAB — CBC
HCT: 25.5 % — ABNORMAL LOW (ref 39.0–52.0)
HEMOGLOBIN: 8.5 g/dL — AB (ref 13.0–17.0)
MCH: 32.6 pg (ref 26.0–34.0)
MCHC: 33.3 g/dL (ref 30.0–36.0)
MCV: 97.7 fL (ref 78.0–100.0)
Platelets: 205 10*3/uL (ref 150–400)
RBC: 2.61 MIL/uL — AB (ref 4.22–5.81)
RDW: 14.4 % (ref 11.5–15.5)
WBC: 6.8 10*3/uL (ref 4.0–10.5)

## 2017-09-02 LAB — PROTIME-INR
INR: 0.99
PROTHROMBIN TIME: 13 s (ref 11.4–15.2)

## 2017-09-02 LAB — HEPATITIS B SURFACE ANTIBODY,QUALITATIVE: Hep B S Ab: REACTIVE

## 2017-09-02 LAB — GLUCOSE, CAPILLARY: GLUCOSE-CAPILLARY: 145 mg/dL — AB (ref 65–99)

## 2017-09-02 LAB — HEPATITIS B CORE ANTIBODY, TOTAL: Hep B Core Total Ab: POSITIVE — AB

## 2017-09-02 LAB — HEPATITIS B SURFACE ANTIGEN: Hepatitis B Surface Ag: NEGATIVE

## 2017-09-02 MED ORDER — DARBEPOETIN ALFA 100 MCG/0.5ML IJ SOSY
100.0000 ug | PREFILLED_SYRINGE | INTRAMUSCULAR | Status: DC
  Administered 2017-09-03: 100 ug via INTRAVENOUS
  Filled 2017-09-02: qty 0.5

## 2017-09-02 NOTE — H&P (View-Only) (Signed)
  Progress Note    09/02/2017 7:34 AM  Subjective:  No new complaints this morning.  Edema and pain in scrotum improved per patient   Vitals:   09/01/17 1926 09/02/17 0445  BP: (!) 142/82 (!) 148/91  Pulse: 60 (!) 59  Resp: 18 16  Temp: 98.4 F (36.9 C) 98.2 F (36.8 C)  SpO2: 97% 95%   Physical Exam: Lungs:  Non labored Incisions:  R IJ TDC unremarkable Extremities:  R groin incision healing well; feet symmetrically warm to touch Abdomen:  Soft Neurologic: A&O  CBC    Component Value Date/Time   WBC 6.8 09/02/2017 0338   RBC 2.61 (L) 09/02/2017 0338   HGB 8.5 (L) 09/02/2017 0338   HCT 25.5 (L) 09/02/2017 0338   PLT 205 09/02/2017 0338   MCV 97.7 09/02/2017 0338   MCH 32.6 09/02/2017 0338   MCHC 33.3 09/02/2017 0338   RDW 14.4 09/02/2017 0338   LYMPHSABS 0.8 08/28/2017 0416   MONOABS 0.6 08/28/2017 0416   EOSABS 0.2 08/28/2017 0416   BASOSABS 0.1 08/28/2017 0416    BMET    Component Value Date/Time   NA 133 (L) 09/02/2017 0338   K 3.6 09/02/2017 0338   CL 91 (L) 09/02/2017 0338   CO2 27 09/02/2017 0338   GLUCOSE 112 (H) 09/02/2017 0338   BUN 65 (H) 09/02/2017 0338   CREATININE 5.40 (H) 09/02/2017 0338   CALCIUM 7.5 (L) 09/02/2017 0338   GFRNONAA 10 (L) 09/02/2017 0338   GFRAA 12 (L) 09/02/2017 0338    INR    Component Value Date/Time   INR 0.99 09/02/2017 0338     Intake/Output Summary (Last 24 hours) at 09/02/2017 0734 Last data filed at 09/02/2017 0618 Gross per 24 hour  Intake 132 ml  Output 1000 ml  Net -868 ml     Assessment/Plan:  62 y.o. male is s/p R ilio-femoral endarterectomy   Dialysis via R IJ [facility] per Nephrology Vein mapping performed; L cephalic vein marginal for use Plan will be for L arm AV fistula vs graft Timing of surgery per Dr. Army Chaco, PA-C [facility] [phone] 09/02/2017 7:34 AM  He feels better and edema is less, however still significant.  He is using ointment to his  penis.  Vein mapping shows he is a candidate for left BVT.  I will do this in stages.  I have added him to the OR schedule for Friday.  He will need his temporary catheter changed to a permanent catheter.  He wants to go home.  His fistula can be done as an outpatient, however he would need his catheter changed out prior to discharge.    WElls Brabham

## 2017-09-02 NOTE — Progress Notes (Signed)
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 iliac stenting who was admitted on 08/24/2017 with signif swelling to area including scrotum, worsening of renal function. [place] placed 08/31/17 IR for HD after failing medical management for volume, and for worsening of CKD5.   Assessment/Plan: 1. 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 Advanced at baseline, creatinine 5.01 on 08/15/17. CKD worsened in the setting of contrast and stenting. Was felt best to proceed with dialysis. Pre HD creatinine today 6.55 1. Turns out IR just placed temp cath 5/20 so VVS will convert to [facility] when they do permanent access (IR needs to correct the report) 2. HD#1 5/21  3. CLIP process started - anticipate [facility] in [place] 4. Volume overload- still getting lasix + metolazone. Given response will keep going for the time being. 2. Anemia - Hb 9.2. TSat 19. Ordered 4 doses Ferrlecit. Aranesp was not given with HD as ordered yesterday. Reorder for next TMT 3. Hyperkalemia- gave veltassa, resolved. No meds now for this. On K restricted renal diet. This should not be an issue once starts HD 4. Hyperphosphatemia - started binder (Ca acetate). PTH still pending.  5. PAD - s/p revasc procedure 08/14/17. VVS following.   Jamal Maes, MD [facility] [phone] Pager 09/02/2017, 2:12 PM  Subjective:   Had HD#1 yesterday well tolerated For HD again 5/23 Per Dr. Trula Slade IR just placed temp cath - note left was for [facility] - I asked IR to correct their report V Map has been done CLIP pending (anticipate [facility])  Objective Vital signs in last 24 hours: Vitals:   09/01/17 1337 09/01/17 1926 09/02/17 0445 09/02/17 0847  BP: (!) 134/98 (!) 142/82 (!) 148/91   Pulse:  60 (!) 59   Resp:  18 16   Temp:  98.4 F (36.9 C) 98.2 F (36.8 C)   TempSrc:  Oral Oral   SpO2:  97% 95%  95%  Weight:   94.5 kg (208 lb 4.8 oz)   Height:       Weight change: -0.646 kg (-1 lb 6.8 oz)  Intake/Output Summary (Last 24 hours) at 09/02/2017 1402 Last data filed at 09/02/2017 1400 Gross per 24 hour  Intake 492 ml  Output 401 ml  Net 91 ml   Physical Exam: General NAD  Walking the halls Regular S1S2 No S3 BS diminished at bases, clear anteriorly Abdomen distended, non tender; + abdominal wall edema, hematoma L abdomen R groin sore Generalized LE edema pitting bilateral 2+ HD cath R side   Recent Labs  Lab 08/31/17 0427 09/01/17 0407 09/02/17 0338  NA 131* 132* 133*  K 4.1 4.5 3.6  CL 88* 88* 91*  CO2 25 24 27   GLUCOSE 98 103* 112*  BUN 82* 89* 65*  CREATININE 6.25* 6.55* 5.40*  CALCIUM 7.7* 7.3* 7.5*  PHOS 10.9* 10.9* 7.3*    Recent Labs  Lab 08/31/17 0427 09/01/17 0407 09/02/17 0338  ALBUMIN 3.2* 3.3* 3.1*    Recent Labs  Lab 08/28/17 0416 08/30/17 0411 08/31/17 0427 09/01/17 0407 09/02/17 0338  WBC 6.8 5.8 6.2 7.7 6.8  NEUTROABS 5.1  --   --   --   --   HGB 9.6* 9.1* 9.2* 9.2* 8.5*  HCT 30.2* 27.8* 27.6* 27.8* 25.5*  MCV 101.0* 97.2 95.8 96.5 97.7  PLT 225 223 235 265 205    Recent  Labs  Lab 08/29/17 0631 08/30/17 0622 08/31/17 0622 09/01/17 0559 09/02/17 0610  GLUCAP 106* 103* 110* 125* 145*   Iron/TIBC/Ferritin/ %Sat    Component Value Date/Time   IRON 45 08/31/2017 0427   TIBC 232 (L) 08/31/2017 0427   IRONPCTSAT 19 08/31/2017 0427   Studies/Results: Ir Fluoro Guide Cv Line Right  Result Date: 08/31/2017 INDICATION: Acute renal failure EXAM: TUNNELED DIALYSIS CATHETER PLACEMENT, ULTRASOUND GUIDANCE FOR VASCULAR ACCESS MEDICATIONS: None ANESTHESIA/SEDATION: None FLUOROSCOPY TIME:  Fluoroscopy Time:  minutes 6 seconds (0 mGy). COMPLICATIONS: None immediate. PROCEDURE: Informed written consent was obtained from the patient after a thorough discussion of the procedural risks, benefits and alternatives. All questions were  addressed. Maximal Sterile Barrier Technique was utilized including caps, mask, sterile gowns, sterile gloves, sterile drape, hand hygiene and skin antiseptic. A timeout was performed prior to the initiation of the procedure. The right neck was prepped with ChloraPrep in a sterile fashion, and a sterile drape was applied covering the operative field. A sterile gown and sterile gloves were used for the procedure. 1% lidocaine into the skin and subcutaneous tissue. The right jugular vein was noted to be patent initially with ultrasound. Under sonographic guidance, a micropuncture needle was inserted into the right IJ vein (Ultrasound and fluoroscopic image documentation was performed). It was removed over an 018 wire which was up-sized to an Amplatz. This was advanced into the IVC. The 12 French dilator followed by the 12 [place] temporary dialysis catheter was advanced over the wire. It was flushed and sewn in place with an 0 Prolene knot. FINDINGS: The tip of the temporary dialysis catheter is at the cavoatrial junction. IMPRESSION: Successful right IJ vein temporary dialysis catheter with its tip at the cavoatrial junction. Electronically Signed   By: Marybelle Killings M.D.   On: 08/31/2017 16:09   Ir US Guide Vasc Access Right  Result Date: 08/31/2017 INDICATION: Acute renal failure EXAM: TUNNELED DIALYSIS CATHETER PLACEMENT, ULTRASOUND GUIDANCE FOR VASCULAR ACCESS MEDICATIONS: None ANESTHESIA/SEDATION: None FLUOROSCOPY TIME:  Fluoroscopy Time:  minutes 6 seconds (0 mGy). COMPLICATIONS: None immediate. PROCEDURE: Informed written consent was obtained from the patient after a thorough discussion of the procedural risks, benefits and alternatives. All questions were addressed. Maximal Sterile Barrier Technique was utilized including caps, mask, sterile gowns, sterile gloves, sterile drape, hand hygiene and skin antiseptic. A timeout was performed prior to the initiation of the procedure. The right neck was prepped  with ChloraPrep in a sterile fashion, and a sterile drape was applied covering the operative field. A sterile gown and sterile gloves were used for the procedure. 1% lidocaine into the skin and subcutaneous tissue. The right jugular vein was noted to be patent initially with ultrasound. Under sonographic guidance, a micropuncture needle was inserted into the right IJ vein (Ultrasound and fluoroscopic image documentation was performed). It was removed over an 018 wire which was up-sized to an Amplatz. This was advanced into the IVC. The 12 French dilator followed by the 12 [place] temporary dialysis catheter was advanced over the wire. It was flushed and sewn in place with an 0 Prolene knot. FINDINGS: The tip of the temporary dialysis catheter is at the cavoatrial junction. IMPRESSION: Successful right IJ vein temporary dialysis catheter with its tip at the cavoatrial junction. Electronically Signed   By: Marybelle Killings M.D.   On: 08/31/2017 16:09   Medications: . sodium chloride    . sodium chloride    . ferric gluconate (FERRLECIT/NULECIT) IV Stopped (09/01/17 1042)  . furosemide  160 mg (09/02/17 1129)    Scheduled Medications: . calcium acetate  1,334 mg Oral TID WC  . carvedilol  12.5 mg Oral BID  . cholecalciferol  2,000 Units Oral Daily  . clopidogrel  75 mg Oral Daily  . darbepoetin (ARANESP) injection - DIALYSIS  100 mcg Intravenous Q Tue-HD  . fluticasone furoate-vilanterol  1 puff Inhalation Daily  . heparin  5,000 Units Subcutaneous Q8H  . mouth rinse  15 mL Mouth Rinse BID  . metolazone  5 mg Oral Daily  . pantoprazole  40 mg Oral BID  . polymixin-bacitracin   Topical BID  . Warfarin - Pharmacist Dosing Inpatient   Does not apply [phone]

## 2017-09-02 NOTE — Progress Notes (Addendum)
  Progress Note    09/02/2017 7:34 AM  Subjective:  No new complaints this morning.  Edema and pain in scrotum improved per patient   Vitals:   09/01/17 1926 09/02/17 0445  BP: (!) 142/82 (!) 148/91  Pulse: 60 (!) 59  Resp: 18 16  Temp: 98.4 F (36.9 C) 98.2 F (36.8 C)  SpO2: 97% 95%   Physical Exam: Lungs:  Non labored Incisions:  R IJ TDC unremarkable Extremities:  R groin incision healing well; feet symmetrically warm to touch Abdomen:  Soft Neurologic: A&O  CBC    Component Value Date/Time   WBC 6.8 09/02/2017 0338   RBC 2.61 (L) 09/02/2017 0338   HGB 8.5 (L) 09/02/2017 0338   HCT 25.5 (L) 09/02/2017 0338   PLT 205 09/02/2017 0338   MCV 97.7 09/02/2017 0338   MCH 32.6 09/02/2017 0338   MCHC 33.3 09/02/2017 0338   RDW 14.4 09/02/2017 0338   LYMPHSABS 0.8 08/28/2017 0416   MONOABS 0.6 08/28/2017 0416   EOSABS 0.2 08/28/2017 0416   BASOSABS 0.1 08/28/2017 0416    BMET    Component Value Date/Time   NA 133 (L) 09/02/2017 0338   K 3.6 09/02/2017 0338   CL 91 (L) 09/02/2017 0338   CO2 27 09/02/2017 0338   GLUCOSE 112 (H) 09/02/2017 0338   BUN 65 (H) 09/02/2017 0338   CREATININE 5.40 (H) 09/02/2017 0338   CALCIUM 7.5 (L) 09/02/2017 0338   GFRNONAA 10 (L) 09/02/2017 0338   GFRAA 12 (L) 09/02/2017 0338    INR    Component Value Date/Time   INR 0.99 09/02/2017 0338     Intake/Output Summary (Last 24 hours) at 09/02/2017 0734 Last data filed at 09/02/2017 0618 Gross per 24 hour  Intake 132 ml  Output 1000 ml  Net -868 ml     Assessment/Plan:  62 y.o. male is s/p R ilio-femoral endarterectomy   Dialysis via R IJ [facility] per Nephrology Vein mapping performed; L cephalic vein marginal for use Plan will be for L arm AV fistula vs graft Timing of surgery per Dr. Army Chaco, PA-C [facility] [phone] 09/02/2017 7:34 AM  He feels better and edema is less, however still significant.  He is using ointment to his  penis.  Vein mapping shows he is a candidate for left BVT.  I will do this in stages.  I have added him to the OR schedule for Friday.  He will need his temporary catheter changed to a permanent catheter.  He wants to go home.  His fistula can be done as an outpatient, however he would need his catheter changed out prior to discharge.    Aaron [name]

## 2017-09-02 NOTE — Progress Notes (Signed)
Left Upper Extremity Vein Map    Basilic  ONLY  Segment Diameter Depth Comment  1. Axilla 7.64mm 3.73mm   2. Mid upper arm 4.46mm 4.23mm   3. Above AC 5.54mm 5.45mm   4. In [facility] 3.24mm 4.36mm   5. Below AC 1.24mm 1.33mm branches  6. Mid forearm mm mm Not visualized  7. Wrist 0.16mm 1.73mm branches   mm mm    mm mm    mm mm    Aaron [name] (RDMS RVT) 09/02/17 12:54 PM

## 2017-09-02 NOTE — Progress Notes (Signed)
PROGRESS NOTE    Aaron [name]  WVP:[id number] DOB: Oct 19, 1955 DOA: 08/24/2017 PCP: Gwenlyn Saran, [facility]   Brief Narrative:  Patient is a 62 y.o. male with history of stage IV/stage V chronic kidney disease-who underwent lower extremity angiography on 4/34 critical limb ischemia and a right external iliac, common femoral, superficial femoral artery endarterectomy with patch angioplasty and placement of a stent on 5/3-admitted with anasarca in the setting of worsening renal function due to probable contrast nephropathy. Hemodialysis started on 5/21 after high dose diuretic failed to improve anasarca. See below for further details  Assessment & Plan: Principal Problem:   Groin swelling Active Problems:   Essential hypertension   Esophageal reflux   SYNCOPE   PAD (peripheral artery disease) (HCC)   COPD (chronic obstructive pulmonary disease) (HCC)   Paroxysmal atrial fibrillation (HCC)   Hyperlipidemia  Anasarca secondary to worsening renal failure due to probable contrast-induced nephropathy-with likely progression to ESRD: Improvment in anasarca after initiation of hemodialysis on 5/21, pt did not respond well to high-dose diuretics. Nephrology following, may go to hemodialysis again today.  Paroxysmal atrial fibrillation: Appears to be in sinus rhythm-continue Coreg, resume Coumadin once all vascular procedures have been completed.   Peripheral arterial disease: As noted above-on 5/3 underwent endarterectomy of femoral, profundofemoral, SFA and stent placement in right common iliac artery, right external iliac artery, and right superficial femoral artery-remains on Plavix.  Vascular surgery following  Hypertension: Controlled with Coreg, metolazone and Lasix.  Anemia: Secondary to chronic kidney disease, continue darbepoetin per nephrology.   GERD: Continue PPI  History of polycystic kidney disease: See above   DVT prophylaxis: Prophylactic Heparin    Code Status: Full Family Communication: None at bedside  Disposition Plan: Remain inpatient, will require several more days of hospitalization prior to discharge    Consultants:   Nephrology and Vascular surgery   Procedures:   09/01/17 - Insertion of right internal jugular temporary hemodialysis catheter   Antimicrobials:   Cephalexin 250 every 12 hrs end on 09/02/2017  Cefazolin 1g/98ml started 08/25/17 ended on 08/26/2017  Cefazolin 1g/66mls started 08/24/2017 ended 5/13/219   Subjective: Patient up ambulating in hallway this am, swelling to bilateral lower extremities improved after initiation of Hemodialysis yesterday on 5/21.   Objective: Vitals:   09/01/17 1337 09/01/17 1926 09/02/17 0445 09/02/17 0847  BP: (!) 134/98 (!) 142/82 (!) 148/91   Pulse:  60 (!) 59   Resp:  18 16   Temp:  98.4 F (36.9 C) 98.2 F (36.8 C)   TempSrc:  Oral Oral   SpO2:  97% 95% 95%  Weight:   94.5 kg (208 lb 4.8 oz)   Height:        Intake/Output Summary (Last 24 hours) at 09/02/2017 1107 Last data filed at 09/02/2017 1011 Gross per 24 hour  Intake 252 ml  Output 200 ml  Net 52 ml   Filed Weights   09/01/17 0409 09/01/17 0740 09/02/17 0445  Weight: 95.3 kg (210 lb 3.2 oz) 94.7 kg (208 lb 12.4 oz) 94.5 kg (208 lb 4.8 oz)    Examination: General exam: Appears calm and comfortable  Respiratory system: Clear to auscultation. Respiratory effort normal. Cardiovascular system: S1 & S2 heard, RRR. No JVD, murmurs, rubs, gallops or clicks. No pedal edema. Gastrointestinal system: Abdomen is nondistended, soft and nontender. No organomegaly or masses felt. Normal bowel sounds heard. Central nervous system: Alert and oriented. No focal neurological deficits. Extremities:  Bilateral lower ext shows ++ edema Symmetric  5 x 5 power. Skin: No rashes, lesions or ulcers Psychiatry: Judgement and insight appear normal. Mood & affect appropriate.    Data Reviewed: I have personally reviewed  following labs and imaging studies  CBC: Recent Labs  Lab 08/28/17 0416 08/30/17 0411 08/31/17 0427 09/01/17 0407 09/02/17 0338  WBC 6.8 5.8 6.2 7.7 6.8  NEUTROABS 5.1  --   --   --   --   HGB 9.6* 9.1* 9.2* 9.2* 8.5*  HCT 30.2* 27.8* 27.6* 27.8* 25.5*  MCV 101.0* 97.2 95.8 96.5 97.7  PLT 225 223 235 265 564   Basic Metabolic Panel: Recent Labs  Lab 08/28/17 1443 08/29/17 0331 08/30/17 0411 08/31/17 0427 09/01/17 0407 09/02/17 0338  NA 135 135 131* 131* 132* 133*  K 5.8* 5.5* 4.8 4.1 4.5 3.6  CL 97* 95* 91* 88* 88* 91*  CO2 24 25 24 25 24 27   GLUCOSE 138* 94 102* 98 103* 112*  BUN 66* 67* 78* 82* 89* 65*  CREATININE 6.46* 5.92* 6.29* 6.25* 6.55* 5.40*  CALCIUM 7.8* 7.8* 7.7* 7.7* 7.3* 7.5*  PHOS 9.1*  --   --  10.9* 10.9* 7.3*   GFR: Estimated Creatinine Clearance: 17.2 mL/min (A) (by C-G formula based on SCr of 5.4 mg/dL (H)). Liver Function Tests: Recent Labs  Lab 08/28/17 1443 08/31/17 0427 09/01/17 0407 09/02/17 0338  ALBUMIN 3.1* 3.2* 3.3* 3.1*   No results for input(s): LIPASE, AMYLASE in the last 168 hours. No results for input(s): AMMONIA in the last 168 hours. Coagulation Profile: Recent Labs  Lab 08/30/17 0411 08/31/17 0427 09/01/17 0407 09/02/17 0338  INR 0.92 0.94 >10.00* 0.99   Cardiac Enzymes: No results for input(s): CKTOTAL, CKMB, CKMBINDEX, TROPONINI in the last 168 hours. BNP (last 3 results) No results for input(s): PROBNP in the last 8760 hours. HbA1C: No results for input(s): HGBA1C in the last 72 hours. CBG: Recent Labs  Lab 08/29/17 0631 08/30/17 0622 08/31/17 0622 09/01/17 0559 09/02/17 0610  GLUCAP 106* 103* 110* 125* 145*   Lipid Profile: No results for input(s): CHOL, HDL, LDLCALC, TRIG, CHOLHDL, LDLDIRECT in the last 72 hours. Thyroid Function Tests: No results for input(s): TSH, T4TOTAL, FREET4, T3FREE, THYROIDAB in the last 72 hours. Anemia Panel: Recent Labs    08/31/17 0427  TIBC 232*  IRON 45   Urine  analysis:    Component Value Date/Time   COLORURINE STRAW (A) 08/14/2017 0846   APPEARANCEUR CLEAR 08/14/2017 0846   LABSPEC 1.006 08/14/2017 0846   PHURINE 5.0 08/14/2017 0846   GLUCOSEU NEGATIVE 08/14/2017 0846   HGBUR SMALL (A) 08/14/2017 0846   BILIRUBINUR NEGATIVE 08/14/2017 0846   KETONESUR NEGATIVE 08/14/2017 0846   PROTEINUR NEGATIVE 08/14/2017 0846   UROBILINOGEN 0.2 03/03/2010 1056   NITRITE NEGATIVE 08/14/2017 0846   LEUKOCYTESUR NEGATIVE 08/14/2017 0846   Sepsis Labs: @LABRCNTIP (procalcitonin:4,lacticidven:4)  )No results found for this or any previous visit (from the past 240 hour(s)).    Radiology Studies: Ir Fluoro Guide Cv Line Right  Result Date: 08/31/2017 INDICATION: Acute renal failure EXAM: TUNNELED DIALYSIS CATHETER PLACEMENT, ULTRASOUND GUIDANCE FOR VASCULAR ACCESS MEDICATIONS: None ANESTHESIA/SEDATION: None FLUOROSCOPY TIME:  Fluoroscopy Time:  minutes 6 seconds (0 mGy). COMPLICATIONS: None immediate. PROCEDURE: Informed written consent was obtained from the patient after a thorough discussion of the procedural risks, benefits and alternatives. All questions were addressed. Maximal Sterile Barrier Technique was utilized including caps, mask, sterile gowns, sterile gloves, sterile drape, hand hygiene and skin antiseptic. A timeout was performed prior to the initiation  of the procedure. The right neck was prepped with ChloraPrep in a sterile fashion, and a sterile drape was applied covering the operative field. A sterile gown and sterile gloves were used for the procedure. 1% lidocaine into the skin and subcutaneous tissue. The right jugular vein was noted to be patent initially with ultrasound. Under sonographic guidance, a micropuncture needle was inserted into the right IJ vein (Ultrasound and fluoroscopic image documentation was performed). It was removed over an 018 wire which was up-sized to an Amplatz. This was advanced into the IVC. The 12 French dilator  followed by the 12 [place] temporary dialysis catheter was advanced over the wire. It was flushed and sewn in place with an 0 Prolene knot. FINDINGS: The tip of the temporary dialysis catheter is at the cavoatrial junction. IMPRESSION: Successful right IJ vein temporary dialysis catheter with its tip at the cavoatrial junction. Electronically Signed   By: Marybelle Killings M.D.   On: 08/31/2017 16:09   Ir US Guide Vasc Access Right  Result Date: 08/31/2017 INDICATION: Acute renal failure EXAM: TUNNELED DIALYSIS CATHETER PLACEMENT, ULTRASOUND GUIDANCE FOR VASCULAR ACCESS MEDICATIONS: None ANESTHESIA/SEDATION: None FLUOROSCOPY TIME:  Fluoroscopy Time:  minutes 6 seconds (0 mGy). COMPLICATIONS: None immediate. PROCEDURE: Informed written consent was obtained from the patient after a thorough discussion of the procedural risks, benefits and alternatives. All questions were addressed. Maximal Sterile Barrier Technique was utilized including caps, mask, sterile gowns, sterile gloves, sterile drape, hand hygiene and skin antiseptic. A timeout was performed prior to the initiation of the procedure. The right neck was prepped with ChloraPrep in a sterile fashion, and a sterile drape was applied covering the operative field. A sterile gown and sterile gloves were used for the procedure. 1% lidocaine into the skin and subcutaneous tissue. The right jugular vein was noted to be patent initially with ultrasound. Under sonographic guidance, a micropuncture needle was inserted into the right IJ vein (Ultrasound and fluoroscopic image documentation was performed). It was removed over an 018 wire which was up-sized to an Amplatz. This was advanced into the IVC. The 12 French dilator followed by the 12 [place] temporary dialysis catheter was advanced over the wire. It was flushed and sewn in place with an 0 Prolene knot. FINDINGS: The tip of the temporary dialysis catheter is at the cavoatrial junction. IMPRESSION: Successful right IJ  vein temporary dialysis catheter with its tip at the cavoatrial junction. Electronically Signed   By: Marybelle Killings M.D.   On: 08/31/2017 16:09    Scheduled Meds: . calcium acetate  1,334 mg Oral TID WC  . carvedilol  12.5 mg Oral BID  . cephALEXin  250 mg Oral Q12H  . cholecalciferol  2,000 Units Oral Daily  . clopidogrel  75 mg Oral Daily  . darbepoetin (ARANESP) injection - DIALYSIS  100 mcg Intravenous Q Tue-HD  . fluticasone furoate-vilanterol  1 puff Inhalation Daily  . heparin  5,000 Units Subcutaneous Q8H  . mouth rinse  15 mL Mouth Rinse BID  . metolazone  5 mg Oral Daily  . pantoprazole  40 mg Oral BID  . polymixin-bacitracin   Topical BID  . Warfarin - Pharmacist Dosing Inpatient   Does not apply q1800   Continuous Infusions: . sodium chloride    . sodium chloride    . ferric gluconate (FERRLECIT/NULECIT) IV Stopped (09/01/17 1042)  . furosemide Stopped (09/02/17 0618)     LOS: 9 days    Time spent: 25 minutes-Greater than 50% of this time was  spent in counseling, explanation of diagnosis, planning of further management, and coordination of care.   Johnsie Cancel, NP [facility]  If 7PM-7AM, please contact night-coverage [email or web address] Password [facility] 09/02/2017, 11:07 AM

## 2017-09-03 LAB — CBC
HCT: 26 % — ABNORMAL LOW (ref 39.0–52.0)
HEMOGLOBIN: 8.6 g/dL — AB (ref 13.0–17.0)
MCH: 32.1 pg (ref 26.0–34.0)
MCHC: 33.1 g/dL (ref 30.0–36.0)
MCV: 97 fL (ref 78.0–100.0)
Platelets: 215 10*3/uL (ref 150–400)
RBC: 2.68 MIL/uL — AB (ref 4.22–5.81)
RDW: 14.4 % (ref 11.5–15.5)
WBC: 6.9 10*3/uL (ref 4.0–10.5)

## 2017-09-03 LAB — PROTIME-INR
INR: 1
Prothrombin Time: 13.1 seconds (ref 11.4–15.2)

## 2017-09-03 LAB — RENAL FUNCTION PANEL
ANION GAP: 15 (ref 5–15)
Albumin: 3.1 g/dL — ABNORMAL LOW (ref 3.5–5.0)
BUN: 73 mg/dL — ABNORMAL HIGH (ref 6–20)
CHLORIDE: 88 mmol/L — AB (ref 101–111)
CO2: 30 mmol/L (ref 22–32)
Calcium: 7.6 mg/dL — ABNORMAL LOW (ref 8.9–10.3)
Creatinine, Ser: 5.66 mg/dL — ABNORMAL HIGH (ref 0.61–1.24)
GFR calc non Af Amer: 10 mL/min — ABNORMAL LOW (ref >60)
GFR, EST AFRICAN AMERICAN: 11 mL/min — AB (ref >60)
Glucose, Bld: 108 mg/dL — ABNORMAL HIGH (ref 65–99)
Phosphorus: 7.4 mg/dL — ABNORMAL HIGH (ref 2.5–4.6)
Potassium: 3.4 mmol/L — ABNORMAL LOW (ref 3.5–5.1)
Sodium: 133 mmol/L — ABNORMAL LOW (ref 135–145)

## 2017-09-03 LAB — GLUCOSE, CAPILLARY: Glucose-Capillary: 112 mg/dL — ABNORMAL HIGH (ref 65–99)

## 2017-09-03 MED ORDER — METOLAZONE 5 MG PO TABS
5.0000 mg | ORAL_TABLET | Freq: Every day | ORAL | Status: DC
  Administered 2017-09-04: 5 mg via ORAL
  Filled 2017-09-03: qty 1

## 2017-09-03 MED ORDER — DARBEPOETIN ALFA 100 MCG/0.5ML IJ SOSY
PREFILLED_SYRINGE | INTRAMUSCULAR | Status: AC
  Filled 2017-09-03: qty 0.5

## 2017-09-03 MED ORDER — CHLORHEXIDINE GLUCONATE 4 % EX LIQD
60.0000 mL | Freq: Once | CUTANEOUS | Status: DC
  Filled 2017-09-03: qty 15

## 2017-09-03 MED ORDER — CALCITRIOL 0.25 MCG PO CAPS
0.2500 ug | ORAL_CAPSULE | ORAL | Status: DC
  Administered 2017-09-03 – 2017-09-05 (×3): 0.25 ug via ORAL
  Filled 2017-09-03 (×3): qty 1

## 2017-09-03 MED ORDER — CHLORHEXIDINE GLUCONATE 4 % EX LIQD
60.0000 mL | Freq: Once | CUTANEOUS | Status: AC
  Administered 2017-09-04: 4 via TOPICAL

## 2017-09-03 NOTE — Procedures (Signed)
I have personally attended this patient's dialysis session.   HD#2 for new ESRD 3 hours, 2-3L goal planned R temp cath  4K bath for K 3.4  Jamal Maes, MD [facility] [phone] Pager 09/03/2017, 2:20 PM

## 2017-09-03 NOTE — Progress Notes (Signed)
Patient on transport via bed to Hemodialysis. Patient alert and verbal. In stable condition. Anticipated return to unit approximately 3-4 hours.

## 2017-09-03 NOTE — Progress Notes (Addendum)
PROGRESS NOTE    Aaron [name]  DJM:[id number] DOB: 1956/03/14 DOA: 08/24/2017 PCP: Gwenlyn Saran, [place]   Brief Narrative:  Patient is a62 y.o.male with history of stage IV/stage V chronic kidney disease-who underwent lower extremity angiography on 4/34 critical limb ischemia and a right external iliac, common femoral, superficial femoral artery endarterectomy with patch angioplasty and placement of a stent on 5/3-admitted with anasarca in the setting of worsening renal function due to probable contrast nephropathy. Hemodialysis started on 5/21 after high dose diuretic failed to improve anasarca.See below for further details  Assessment & Plan: Anasarca secondary to worsening renal failure due to probable contrast-induced nephropathy-with likely progression to ESRD:Improvment in anasarca after initiation of hemodialysis on 5/21, pt did not respond well to high-dose diuretics.Nephrology following, hemodialysis again today. Placement of [place] scheduled 5/24 and clipping process initiated.  Paroxysmal atrial fibrillation:Appears to be in sinus rhythm-continue Coreg, resume Coumadin once all vascular procedures have been completed.  Peripheral arterial disease:As noted above-on 5/3 underwent endarterectomy of femoral, profundofemoral, SFA and stent placement in right common iliac artery, right external iliac artery, and right superficial femoral artery-remains on Plavix. Vascular surgery following  Hypertension:Controlled with Coreg, metolazone and Lasix.  Anemia:Secondary to chronic kidney disease, continue darbepoetin per nephrology.  GERD:Continue PPI  History of polycystic kidney disease: See above   DVT prophylaxis: Prophylactic Heparin  Code Status: Full Family Communication: None at bedside  Disposition Plan: Remain inpatient, will require several more days of hospitalization prior to discharge   Consultants:   Nephrology and Vascular surgery     Procedures:   09/01/17 - Insertion of right internal jugular temporary hemodialysis catheter   Antimicrobials:   Cephalexin 250 every 12 hrs end on 09/02/2017  Cefazolin 1g/65ml started 08/25/17 ended on 08/26/2017  Cefazolin 1g/81mls started 08/24/2017 ended 5/13/219  Subjective: Patient resting in bed this am awaiting next hemodialysis treatment with no new complaints.   Objective: Vitals:   09/02/17 2025 09/03/17 0452 09/03/17 0754 09/03/17 0906  BP: (!) 183/76 (!) 144/98 (!) 151/85   Pulse: 62 (!) 51 (!) 53   Resp: 20 16    Temp: 98.9 F (37.2 C) 98.1 F (36.7 C) 97.6 F (36.4 C)   TempSrc: Oral Oral Oral   SpO2: 99% 97% 98% 99%  Weight:  92.6 kg (204 lb 1.6 oz)    Height:        Intake/Output Summary (Last 24 hours) at 09/03/2017 1056 Last data filed at 09/03/2017 2229 Gross per 24 hour  Intake 852 ml  Output 1351 ml  Net -499 ml   Filed Weights   09/01/17 0740 09/02/17 0445 09/03/17 0452  Weight: 94.7 kg (208 lb 12.4 oz) 94.5 kg (208 lb 4.8 oz) 92.6 kg (204 lb 1.6 oz)    Examination: BP (!) 151/85 (BP Location: Right Arm)   Pulse (!) 53   Temp 97.6 F (36.4 C) (Oral)   Resp 16   Ht 6\' 3"  (1.905 m)   Wt 92.6 kg (204 lb 1.6 oz)   SpO2 99%   BMI 25.51 kg/m  Filed Weights   09/01/17 0740 09/02/17 0445 09/03/17 0452  Weight: 94.7 kg (208 lb 12.4 oz) 94.5 kg (208 lb 4.8 oz) 92.6 kg (204 lb 1.6 oz)   General exam: Appears calm and comfortable  Respiratory system: Clear to auscultation. Respiratory effort normal. Cardiovascular system: S1 & S2 heard, RRR. No JVD, murmurs, rubs, gallops or clicks. No pedal edema. Gastrointestinal system: Abdomen is nondistended, soft and nontender. No  organomegaly or masses felt. Normal bowel sounds heard. Central nervous system: Alert and oriented. No focal neurological deficits. Extremities: Bilateral lower ext ++ edema, Symmetric 5 x 5 power. Skin: No rashes, lesions or ulcers Psychiatry: Judgement and insight appear  normal. Mood & affect appropriate.     Data Reviewed: I have personally reviewed following labs and imaging studies  CBC: Recent Labs  Lab 08/28/17 0416 08/30/17 0411 08/31/17 0427 09/01/17 0407 09/02/17 0338 09/03/17 0352  WBC 6.8 5.8 6.2 7.7 6.8 6.9  NEUTROABS 5.1  --   --   --   --   --   HGB 9.6* 9.1* 9.2* 9.2* 8.5* 8.6*  HCT 30.2* 27.8* 27.6* 27.8* 25.5* 26.0*  MCV 101.0* 97.2 95.8 96.5 97.7 97.0  PLT 225 223 235 265 205 540   Basic Metabolic Panel: Recent Labs  Lab 08/28/17 1443  08/30/17 0411 08/31/17 0427 09/01/17 0407 09/02/17 0338 09/03/17 0352  NA 135   < > 131* 131* 132* 133* 133*  K 5.8*   < > 4.8 4.1 4.5 3.6 3.4*  CL 97*   < > 91* 88* 88* 91* 88*  CO2 24   < > 24 25 24 27 30   GLUCOSE 138*   < > 102* 98 103* 112* 108*  BUN 66*   < > 78* 82* 89* 65* 73*  CREATININE 6.46*   < > 6.29* 6.25* 6.55* 5.40* 5.66*  CALCIUM 7.8*   < > 7.7* 7.7* 7.3* 7.5* 7.6*  PHOS 9.1*  --   --  10.9* 10.9* 7.3* 7.4*   < > = values in this interval not displayed.   GFR: Estimated Creatinine Clearance: 16.4 mL/min (A) (by C-G formula based on SCr of 5.66 mg/dL (H)). Liver Function Tests: Recent Labs  Lab 08/28/17 1443 08/31/17 0427 09/01/17 0407 09/02/17 0338 09/03/17 0352  ALBUMIN 3.1* 3.2* 3.3* 3.1* 3.1*   No results for input(s): LIPASE, AMYLASE in the last 168 hours. No results for input(s): AMMONIA in the last 168 hours. Coagulation Profile: Recent Labs  Lab 08/30/17 0411 08/31/17 0427 09/01/17 0407 09/02/17 0338 09/03/17 0352  INR 0.92 0.94 >10.00* 0.99 1.00   Cardiac Enzymes: No results for input(s): CKTOTAL, CKMB, CKMBINDEX, TROPONINI in the last 168 hours. BNP (last 3 results) No results for input(s): PROBNP in the last 8760 hours. HbA1C: No results for input(s): HGBA1C in the last 72 hours. CBG: Recent Labs  Lab 08/30/17 0622 08/31/17 0622 09/01/17 0559 09/02/17 0610 09/03/17 0612  GLUCAP 103* 110* 125* 145* 112*   Lipid Profile: No  results for input(s): CHOL, HDL, LDLCALC, TRIG, CHOLHDL, LDLDIRECT in the last 72 hours. Thyroid Function Tests: No results for input(s): TSH, T4TOTAL, FREET4, T3FREE, THYROIDAB in the last 72 hours. Anemia Panel: No results for input(s): VITAMINB12, FOLATE, FERRITIN, TIBC, IRON, RETICCTPCT in the last 72 hours. Urine analysis:    Component Value Date/Time   COLORURINE STRAW (A) 08/14/2017 0846   APPEARANCEUR CLEAR 08/14/2017 0846   LABSPEC 1.006 08/14/2017 0846   PHURINE 5.0 08/14/2017 0846   GLUCOSEU NEGATIVE 08/14/2017 0846   HGBUR SMALL (A) 08/14/2017 0846   BILIRUBINUR NEGATIVE 08/14/2017 0846   KETONESUR NEGATIVE 08/14/2017 0846   PROTEINUR NEGATIVE 08/14/2017 0846   UROBILINOGEN 0.2 03/03/2010 1056   NITRITE NEGATIVE 08/14/2017 0846   LEUKOCYTESUR NEGATIVE 08/14/2017 0846   Sepsis Labs: @LABRCNTIP (procalcitonin:4,lacticidven:4)  )No results found for this or any previous visit (from the past 240 hour(s)).    Radiology Studies: No results found.   Scheduled Meds: .  calcitRIOL  0.25 mcg Oral Q T,Th,Sa-HD  . calcium acetate  1,334 mg Oral TID WC  . carvedilol  12.5 mg Oral BID  . chlorhexidine  60 mL Topical Once   And  . [START ON 09/04/2017] chlorhexidine  60 mL Topical Once  . cholecalciferol  2,000 Units Oral Daily  . clopidogrel  75 mg Oral Daily  . darbepoetin (ARANESP) injection - DIALYSIS  100 mcg Intravenous Q Thu-HD  . fluticasone furoate-vilanterol  1 puff Inhalation Daily  . heparin  5,000 Units Subcutaneous Q8H  . mouth rinse  15 mL Mouth Rinse BID  . metolazone  5 mg Oral Daily  . pantoprazole  40 mg Oral BID  . polymixin-bacitracin   Topical BID  . Warfarin - Pharmacist Dosing Inpatient   Does not apply q1800   Continuous Infusions: . sodium chloride    . sodium chloride    . ferric gluconate (FERRLECIT/NULECIT) IV 125 mg (09/03/17 0946)  . furosemide 160 mg (09/03/17 0550)     LOS: 10 days    Time spent:  25 minutes-Greater than 50% of  this time was spent in counseling, explanation of diagnosis, planning of further management, and coordination of care.   Johnsie Cancel, NP [facility]   If 7PM-7AM, please contact night-coverage [email or web address] Password [facility] 09/03/2017, 10:22 AM    Attending MD note  Patient was seen, examined,treatment plan was discussed with the NP.  I have personally reviewed the clinical findings, lab, imaging studies and management of this patient in detail. I agree with the documentation, as recorded by the NP.   Subjective: Decreasing edema-inquiring about discharge  Telemetry (Personally reviewed): Sinus rhythm  On Exam: Vitals:   09/02/17 2025 09/03/17 0452 09/03/17 0754 09/03/17 0906  BP: (!) 183/76 (!) 144/98 (!) 151/85   Pulse: 62 (!) 51 (!) 53   Resp: 20 16    Temp: 98.9 F (37.2 C) 98.1 F (36.7 C) 97.6 F (36.4 C)   TempSrc: Oral Oral Oral   SpO2: 99% 97% 98% 99%  Weight:  92.6 kg (204 lb 1.6 oz)    Height:       Gen. exam: Awake, alert, not in any distress Chest: Good air entry bilaterally, no rhonchi or rales CVS: S1-S2 regular, no murmurs Abdomen: Soft, nontender and nondistended Neurology: Non-focal Lower extremities: 1+ edema Skin: No rash or lesions   Lab Data: CBC: Recent Labs  Lab 08/28/17 0416 08/30/17 0411 08/31/17 0427 09/01/17 0407 09/02/17 0338 09/03/17 0352  WBC 6.8 5.8 6.2 7.7 6.8 6.9  NEUTROABS 5.1  --   --   --   --   --   HGB 9.6* 9.1* 9.2* 9.2* 8.5* 8.6*  HCT 30.2* 27.8* 27.6* 27.8* 25.5* 26.0*  MCV 101.0* 97.2 95.8 96.5 97.7 97.0  PLT 225 223 235 265 205 035    Basic Metabolic Panel: Recent Labs  Lab 08/28/17 1443  08/30/17 0411 08/31/17 0427 09/01/17 0407 09/02/17 0338 09/03/17 0352  NA 135   < > 131* 131* 132* 133* 133*  K 5.8*   < > 4.8 4.1 4.5 3.6 3.4*  CL 97*   < > 91* 88* 88* 91* 88*  CO2 24   < > 24 25 24 27 30   GLUCOSE 138*   < > 102* 98 103* 112* 108*  BUN 66*   < > 78* 82* 89* 65* 73*  CREATININE 6.46*   <  > 6.29* 6.25* 6.55* 5.40* 5.66*  CALCIUM 7.8*   < >  7.7* 7.7* 7.3* 7.5* 7.6*  PHOS 9.1*  --   --  10.9* 10.9* 7.3* 7.4*   < > = values in this interval not displayed.    Impression: Worsening chronic kidney disease stage V-now likely ESRD: Nephrology following and arranging HD.  Since continues to have a urine output-nephrology continue Lasix and metolazone.  Paroxysmal atrial fibrillation: Continue Coreg-resume Coumadin once all vascular surgery procedures are completed.  [facility]VS contemplating placing a permanent HD access on 5/24  Hypertension: Controlled with Coreg  Anemia: Likely secondary to CKD-continue darbepoetin  GERD: Continue PPI  Total time spent 25 minutes  Rest as above  [facility]

## 2017-09-03 NOTE — Progress Notes (Signed)
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 iliac stenting who was admitted on 08/24/2017 with signif swelling to area including scrotum, worsening of renal function. [place] placed 08/31/17 IR for HD after failing medical management for volume, and for worsening of CKD5.   Assessment/Plan: 1. 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 CKD worsened in the setting of contrast and stenting for PAD. Felt best to proceed with dialysis for new ESRD.  1. Turns out IR just placed temp cath 5/20 so [facility] will convert to [facility] when they do permanent access (IR needs to correct the report) - access all planned for Friday by Dr. Trula Slade 2. HD#1 5/21, #2 should be today  3. CLIP process started - anticipate [facility] in [place] but nothing final 4. Volume overload- HD/UF + still getting lasix + metolazone. Given UOP have kept going for the time being. 2. Anemia - Hb 9.2. TSat 19.  1. Ordered 4 doses Ferrlecit (not on HD so can make sure not missed).  2. Aranesp was not given with HD as ordered 5/21. Reordered for next TMT today 3. Hyperkalemia- resolved 4. Hyperphosphatemia - started binder (Ca acetate).  5. Secondary hyperpara - PTH 269. Starting calcitriol 0.25 w/HD.  6. PAD - s/p revasc procedure 08/14/17. [facility] following.   Jamal Maes, MD [facility] [phone] Pager 09/03/2017, 10:36 AM  Subjective:   Had HD#1 5/21 well tolerated For HD again 5/23 today Turns out  IR just placed temp cath - note they left was for [facility] - I asked IR to have them correct their report Permanent access set for Friday CLIP pending (anticipate [facility] but nothing finalized  Objective Vital signs in last 24 hours: Vitals:   09/02/17 2025 09/03/17 0452 09/03/17 0754 09/03/17 0906  BP: (!) 183/76 (!) 144/98 (!) 151/85   Pulse: 62 (!) 51 (!) 53   Resp: 20 16    Temp: 98.9 F  (37.2 C) 98.1 F (36.7 C) 97.6 F (36.4 C)   TempSrc: Oral Oral Oral   SpO2: 99% 97% 98% 99%  Weight:  92.6 kg (204 lb 1.6 oz)    Height:       Weight change: -2.121 kg (-4 lb 10.8 oz)  Intake/Output Summary (Last 24 hours) at 09/03/2017 1036 Last data filed at 09/03/2017 0607 Gross per 24 hour  Intake 852 ml  Output 1351 ml  Net -499 ml   Physical Exam: General NAD  Walking the halls all the time Regular S1S2 No S3 BS diminished at bases, clear anteriorly Abdomen distended, non tender; + abdominal wall edema, hematoma L abdomen R groin sore but getting better Generalized LE edema pitting bilateral now 1-2+ HD temp cath R side (5/20)   Recent Labs  Lab 09/01/17 0407 09/02/17 0338 09/03/17 0352  NA 132* 133* 133*  K 4.5 3.6 3.4*  CL 88* 91* 88*  CO2 24 27 30   GLUCOSE 103* 112* 108*  BUN 89* 65* 73*  CREATININE 6.55* 5.40* 5.66*  CALCIUM 7.3* 7.5* 7.6*  PHOS 10.9* 7.3* 7.4*    Recent Labs  Lab 09/01/17 0407 09/02/17 0338 09/03/17 0352  ALBUMIN 3.3* 3.1* 3.1*    Recent Labs  Lab 08/28/17 0416 08/30/17 0411 08/31/17 0427 09/01/17 0407 09/02/17 0338 09/03/17 0352  WBC 6.8 5.8 6.2 7.7 6.8 6.9  NEUTROABS 5.1  --   --   --   --   --  HGB 9.6* 9.1* 9.2* 9.2* 8.5* 8.6*  HCT 30.2* 27.8* 27.6* 27.8* 25.5* 26.0*  MCV 101.0* 97.2 95.8 96.5 97.7 97.0  PLT 225 223 235 265 205 215    Recent Labs  Lab 08/30/17 0622 08/31/17 0622 09/01/17 0559 09/02/17 0610 09/03/17 0612  GLUCAP 103* 110* 125* 145* 112*   Iron/TIBC/Ferritin/ %Sat    Component Value Date/Time   IRON 45 08/31/2017 0427   TIBC 232 (L) 08/31/2017 0427   IRONPCTSAT 19 08/31/2017 0427   PTH    Component Value Date/Time   PTH 269 (H) 08/31/2017 0427    Medications: . sodium chloride    . sodium chloride    . ferric gluconate (FERRLECIT/NULECIT) IV 125 mg (09/03/17 0946)  . furosemide 160 mg (09/03/17 0550)    Scheduled Medications: . calcium acetate  1,334 mg Oral TID WC  .  carvedilol  12.5 mg Oral BID  . chlorhexidine  60 mL Topical Once   And  . [START ON 09/04/2017] chlorhexidine  60 mL Topical Once  . cholecalciferol  2,000 Units Oral Daily  . clopidogrel  75 mg Oral Daily  . darbepoetin (ARANESP) injection - DIALYSIS  100 mcg Intravenous Q Thu-HD  . fluticasone furoate-vilanterol  1 puff Inhalation Daily  . heparin  5,000 Units Subcutaneous Q8H  . mouth rinse  15 mL Mouth Rinse BID  . metolazone  5 mg Oral Daily  . pantoprazole  40 mg Oral BID  . polymixin-bacitracin   Topical BID  . Warfarin - Pharmacist Dosing Inpatient   Does not apply [phone]

## 2017-09-04 ENCOUNTER — Encounter (HOSPITAL_COMMUNITY)
Admission: EM | Disposition: A | Discharge status: Home / Self Care | Payer: Self-pay | Source: Home / Self Care | Attending: Internal Medicine

## 2017-09-04 ENCOUNTER — Inpatient Hospital Stay (HOSPITAL_COMMUNITY): Payer: 59 | Admitting: Certified Registered Nurse Anesthetist

## 2017-09-04 ENCOUNTER — Inpatient Hospital Stay (HOSPITAL_COMMUNITY): Payer: 59

## 2017-09-04 ENCOUNTER — Encounter (HOSPITAL_COMMUNITY): Payer: Self-pay

## 2017-09-04 DIAGNOSIS — Z992 Dependence on renal dialysis: Secondary | ICD-10-CM

## 2017-09-04 DIAGNOSIS — N186 End stage renal disease: Secondary | ICD-10-CM

## 2017-09-04 HISTORY — PX: INSERTION OF DIALYSIS CATHETER: SHX1324

## 2017-09-04 HISTORY — PX: AV FISTULA PLACEMENT: SHX1204

## 2017-09-04 LAB — CBC
HCT: 29.5 % — ABNORMAL LOW (ref 39.0–52.0)
Hemoglobin: 9.7 g/dL — ABNORMAL LOW (ref 13.0–17.0)
MCH: 32.6 pg (ref 26.0–34.0)
MCHC: 32.9 g/dL (ref 30.0–36.0)
MCV: 99 fL (ref 78.0–100.0)
PLATELETS: 266 10*3/uL (ref 150–400)
RBC: 2.98 MIL/uL — ABNORMAL LOW (ref 4.22–5.81)
RDW: 14.9 % (ref 11.5–15.5)
WBC: 8.6 10*3/uL (ref 4.0–10.5)

## 2017-09-04 LAB — RENAL FUNCTION PANEL
ALBUMIN: 3.3 g/dL — AB (ref 3.5–5.0)
ANION GAP: 15 (ref 5–15)
BUN: 40 mg/dL — AB (ref 6–20)
CALCIUM: 7.8 mg/dL — AB (ref 8.9–10.3)
CO2: 27 mmol/L (ref 22–32)
CREATININE: 3.99 mg/dL — AB (ref 0.61–1.24)
Chloride: 94 mmol/L — ABNORMAL LOW (ref 101–111)
GFR calc Af Amer: 17 mL/min — ABNORMAL LOW (ref >60)
GFR calc non Af Amer: 15 mL/min — ABNORMAL LOW (ref >60)
GLUCOSE: 103 mg/dL — AB (ref 65–99)
Phosphorus: 3.9 mg/dL (ref 2.5–4.6)
Potassium: 3.4 mmol/L — ABNORMAL LOW (ref 3.5–5.1)
SODIUM: 136 mmol/L (ref 135–145)

## 2017-09-04 LAB — GLUCOSE, CAPILLARY: Glucose-Capillary: 126 mg/dL — ABNORMAL HIGH (ref 65–99)

## 2017-09-04 LAB — PROTIME-INR
INR: 1.01
PROTHROMBIN TIME: 13.2 s (ref 11.4–15.2)

## 2017-09-04 SURGERY — INSERTION OF DIALYSIS CATHETER
Anesthesia: General | Site: Chest | Laterality: Right

## 2017-09-04 MED ORDER — MEPERIDINE HCL 50 MG/ML IJ SOLN: 6.2500 mg | INTRAMUSCULAR | Status: DC | PRN

## 2017-09-04 MED ORDER — ONDANSETRON HCL 4 MG/2ML IJ SOLN
INTRAMUSCULAR | Status: DC | PRN
  Administered 2017-09-04: 4 mg via INTRAVENOUS

## 2017-09-04 MED ORDER — CEFAZOLIN SODIUM-DEXTROSE 2-4 GM/100ML-% IV SOLN
2.0000 g | INTRAVENOUS | Status: DC
  Administered 2017-09-04: 2 g via INTRAVENOUS
  Filled 2017-09-04 (×2): qty 100

## 2017-09-04 MED ORDER — PROPOFOL 10 MG/ML IV BOLUS
INTRAVENOUS | Status: AC
  Filled 2017-09-04: qty 20

## 2017-09-04 MED ORDER — LIDOCAINE HCL (CARDIAC) PF 100 MG/5ML IV SOSY
PREFILLED_SYRINGE | INTRAVENOUS | Status: DC | PRN
  Administered 2017-09-04: 80 mg via INTRAVENOUS

## 2017-09-04 MED ORDER — HYDROMORPHONE HCL 2 MG/ML IJ SOLN: 0.2500 mg | INTRAMUSCULAR | Status: DC | PRN

## 2017-09-04 MED ORDER — GLYCOPYRROLATE 0.2 MG/ML IJ SOLN
INTRAMUSCULAR | Status: DC | PRN
  Administered 2017-09-04 (×2): .2 mg via INTRAVENOUS

## 2017-09-04 MED ORDER — CHLORHEXIDINE GLUCONATE CLOTH 2 % EX PADS
6.0000 | MEDICATED_PAD | Freq: Every day | CUTANEOUS | Status: DC
  Administered 2017-09-05: 6 via TOPICAL

## 2017-09-04 MED ORDER — HYDROCODONE-ACETAMINOPHEN 7.5-325 MG PO TABS: 1.0000 | ORAL_TABLET | Freq: Once | ORAL | Status: DC | PRN

## 2017-09-04 MED ORDER — ACETAMINOPHEN 10 MG/ML IV SOLN: 1000.0000 mg | Freq: Once | INTRAVENOUS | Status: DC | PRN

## 2017-09-04 MED ORDER — ONDANSETRON HCL 4 MG/2ML IJ SOLN
INTRAMUSCULAR | Status: AC
  Filled 2017-09-04: qty 2

## 2017-09-04 MED ORDER — HEPARIN SODIUM (PORCINE) 5000 UNIT/ML IJ SOLN
INTRAMUSCULAR | Status: DC | PRN
  Administered 2017-09-04: 14:00:00

## 2017-09-04 MED ORDER — PROTAMINE SULFATE 10 MG/ML IV SOLN
INTRAVENOUS | Status: DC | PRN
  Administered 2017-09-04: 25 mg via INTRAVENOUS

## 2017-09-04 MED ORDER — PHENYLEPHRINE HCL 10 MG/ML IJ SOLN
INTRAVENOUS | Status: DC | PRN
  Administered 2017-09-04: 40 ug/min via INTRAVENOUS

## 2017-09-04 MED ORDER — PROPOFOL 10 MG/ML IV BOLUS
INTRAVENOUS | Status: DC | PRN
  Administered 2017-09-04: 130 mg via INTRAVENOUS

## 2017-09-04 MED ORDER — CEFAZOLIN SODIUM-DEXTROSE 2-3 GM-%(50ML) IV SOLR: INTRAVENOUS | Status: DC | PRN

## 2017-09-04 MED ORDER — MIDAZOLAM HCL 2 MG/2ML IJ SOLN
INTRAMUSCULAR | Status: AC
  Filled 2017-09-04: qty 2

## 2017-09-04 MED ORDER — HEPARIN SODIUM (PORCINE) 5000 UNIT/ML IJ SOLN
INTRAMUSCULAR | Status: AC
  Filled 2017-09-04: qty 1.2

## 2017-09-04 MED ORDER — HEPARIN SODIUM (PORCINE) 1000 UNIT/ML IJ SOLN
INTRAMUSCULAR | Status: DC | PRN
  Administered 2017-09-04: 3000 [IU] via INTRAVENOUS

## 2017-09-04 MED ORDER — FENTANYL CITRATE (PF) 100 MCG/2ML IJ SOLN
INTRAMUSCULAR | Status: DC | PRN
  Administered 2017-09-04: 25 ug via INTRAVENOUS
  Administered 2017-09-04: 50 ug via INTRAVENOUS
  Administered 2017-09-04 (×4): 25 ug via INTRAVENOUS
  Administered 2017-09-04: 50 ug via INTRAVENOUS
  Administered 2017-09-04: 25 ug via INTRAVENOUS

## 2017-09-04 MED ORDER — PROMETHAZINE HCL 25 MG/ML IJ SOLN: 6.2500 mg | INTRAMUSCULAR | Status: DC | PRN

## 2017-09-04 MED ORDER — 0.9 % SODIUM CHLORIDE (POUR BTL) OPTIME
TOPICAL | Status: DC | PRN
  Administered 2017-09-04: 1000 mL

## 2017-09-04 MED ORDER — CEFAZOLIN SODIUM-DEXTROSE 2-3 GM-%(50ML) IV SOLR
INTRAVENOUS | Status: DC | PRN
  Administered 2017-09-04: 2 g via INTRAVENOUS

## 2017-09-04 MED ORDER — HEPARIN SODIUM (PORCINE) 1000 UNIT/ML IJ SOLN
INTRAMUSCULAR | Status: AC
  Filled 2017-09-04: qty 1

## 2017-09-04 MED ORDER — PROMETHAZINE HCL 25 MG/ML IJ SOLN
6.2500 mg | INTRAMUSCULAR | Status: DC | PRN
Start: 2017-09-04 — End: 2017-09-04

## 2017-09-04 MED ORDER — SODIUM CHLORIDE 0.9 % IV SOLN
INTRAVENOUS | Status: DC
  Administered 2017-09-04: 12:00:00 via INTRAVENOUS

## 2017-09-04 MED ORDER — HEPARIN SODIUM (PORCINE) 1000 UNIT/ML IJ SOLN
INTRAMUSCULAR | Status: DC | PRN
  Administered 2017-09-04: 1000 [IU]

## 2017-09-04 MED ORDER — FENTANYL CITRATE (PF) 250 MCG/5ML IJ SOLN
INTRAMUSCULAR | Status: AC
  Filled 2017-09-04: qty 5

## 2017-09-04 MED ORDER — SODIUM CHLORIDE 0.9 % IV SOLN
INTRAVENOUS | Status: DC
  Administered 2017-09-04: 07:00:00 via INTRAVENOUS

## 2017-09-04 SURGICAL SUPPLY — 58 items
ADH SKN CLS APL DERMABOND .7 (GAUZE/BANDAGES/DRESSINGS) ×4
ARMBAND PINK RESTRICT EXTREMIT (MISCELLANEOUS) ×8 IMPLANT
BIOPATCH RED 1 DISK 7.0 (GAUZE/BANDAGES/DRESSINGS) ×3 IMPLANT
BIOPATCH RED 1IN DISK 7.0MM (GAUZE/BANDAGES/DRESSINGS) ×1
CANISTER SUCT 3000ML PPV (MISCELLANEOUS) ×4 IMPLANT
CATH PALINDROME RT-P 15FX23CM (CATHETERS) ×2 IMPLANT
CLIP VESOCCLUDE MED 6/CT (CLIP) ×4 IMPLANT
CLIP VESOCCLUDE SM WIDE 6/CT (CLIP) ×4 IMPLANT
COVER PROBE W GEL 5X96 (DRAPES) ×4 IMPLANT
COVER SURGICAL LIGHT HANDLE (MISCELLANEOUS) ×4 IMPLANT
DERMABOND ADVANCED (GAUZE/BANDAGES/DRESSINGS) ×4
DERMABOND ADVANCED .7 DNX12 (GAUZE/BANDAGES/DRESSINGS) ×2 IMPLANT
DRAPE C-ARM 42X72 X-RAY (DRAPES) ×4 IMPLANT
DRAPE CHEST BREAST 15X10 FENES (DRAPES) ×4 IMPLANT
ELECT REM PT RETURN 9FT ADLT (ELECTROSURGICAL) ×4
ELECTRODE REM PT RTRN 9FT ADLT (ELECTROSURGICAL) ×2 IMPLANT
GAUZE SPONGE 4X4 16PLY XRAY LF (GAUZE/BANDAGES/DRESSINGS) ×2 IMPLANT
GLOVE BIO SURGEON STRL SZ 6.5 (GLOVE) ×1 IMPLANT
GLOVE BIO SURGEON STRL SZ7.5 (GLOVE) ×2 IMPLANT
GLOVE BIO SURGEONS STRL SZ 6.5 (GLOVE) ×1
GLOVE BIOGEL PI IND STRL 6.5 (GLOVE) IMPLANT
GLOVE BIOGEL PI IND STRL 7.0 (GLOVE) IMPLANT
GLOVE BIOGEL PI IND STRL 7.5 (GLOVE) ×2 IMPLANT
GLOVE BIOGEL PI INDICATOR 6.5 (GLOVE) ×6
GLOVE BIOGEL PI INDICATOR 7.0 (GLOVE) ×4
GLOVE BIOGEL PI INDICATOR 7.5 (GLOVE) ×4
GLOVE SURG SS PI 7.5 STRL IVOR (GLOVE) ×6 IMPLANT
GOWN STRL REUS W/ TWL LRG LVL3 (GOWN DISPOSABLE) ×4 IMPLANT
GOWN STRL REUS W/ TWL XL LVL3 (GOWN DISPOSABLE) ×2 IMPLANT
GOWN STRL REUS W/TWL LRG LVL3 (GOWN DISPOSABLE) ×4
GOWN STRL REUS W/TWL XL LVL3 (GOWN DISPOSABLE) ×12
HEMOSTAT SNOW SURGICEL 2X4 (HEMOSTASIS) IMPLANT
KIT BASIN OR (CUSTOM PROCEDURE TRAY) ×4 IMPLANT
KIT TURNOVER KIT B (KITS) ×4 IMPLANT
MARKER SKIN DUAL TIP RULER LAB (MISCELLANEOUS) ×2 IMPLANT
NDL 18GX1X1/2 (RX/OR ONLY) (NEEDLE) ×2 IMPLANT
NDL HYPO 25GX1X1/2 BEV (NEEDLE) ×2 IMPLANT
NEEDLE 18GX1X1/2 (RX/OR ONLY) (NEEDLE) ×4 IMPLANT
NEEDLE HYPO 25GX1X1/2 BEV (NEEDLE) IMPLANT
NS IRRIG 1000ML POUR BTL (IV SOLUTION) ×4 IMPLANT
PACK CV ACCESS (CUSTOM PROCEDURE TRAY) ×4 IMPLANT
PACK SURGICAL SETUP 50X90 (CUSTOM PROCEDURE TRAY) ×2 IMPLANT
PAD ARMBOARD 7.5X6 YLW CONV (MISCELLANEOUS) ×8 IMPLANT
SOAP 2 % CHG 4 OZ (WOUND CARE) ×4 IMPLANT
SUT ETHILON 3 0 PS 1 (SUTURE) ×4 IMPLANT
SUT PROLENE 6 0 CC (SUTURE) ×4 IMPLANT
SUT VIC AB 3-0 SH 27 (SUTURE) ×8
SUT VIC AB 3-0 SH 27X BRD (SUTURE) ×2 IMPLANT
SUT VICRYL 4-0 PS2 18IN ABS (SUTURE) ×10 IMPLANT
SYR 10ML LL (SYRINGE) ×4 IMPLANT
SYR 20CC LL (SYRINGE) ×6 IMPLANT
SYR 5ML LL (SYRINGE) ×4 IMPLANT
SYR CONTROL 10ML LL (SYRINGE) ×2 IMPLANT
TOWEL GREEN STERILE (TOWEL DISPOSABLE) ×8 IMPLANT
TOWEL GREEN STERILE FF (TOWEL DISPOSABLE) ×4 IMPLANT
UNDERPAD 30X30 (UNDERPADS AND DIAPERS) ×4 IMPLANT
WATER STERILE IRR 1000ML POUR (IV SOLUTION) ×4 IMPLANT
WIRE BENTSON .035X145CM (WIRE) ×2 IMPLANT

## 2017-09-04 NOTE — Anesthesia Preprocedure Evaluation (Addendum)
Anesthesia Evaluation  Patient identified by MRN, date of birth, ID band Patient awake    Reviewed: Allergy & Precautions, NPO status , Patient's Chart, lab work & pertinent test results  Airway Mallampati: II  TM Distance: >3 FB Neck ROM: Full    Dental  (+) Edentulous Upper, Edentulous Lower   Pulmonary COPD, former smoker,    Pulmonary exam normal        Cardiovascular hypertension, Pt. on home beta blockers + CAD and + Peripheral Vascular Disease  Normal cardiovascular exam+ dysrhythmias Atrial Fibrillation  Rhythm:Regular Rate:Normal  Echo 08/25/17 Left ventricle: The cavity size was normal. There was moderate   concentric hypertrophy. Systolic function was normal. The   estimated ejection fraction was in the range of 55% to 60%. Wall   motion was normal; there were no regional wall motion   abnormalities. Left ventricular diastolic function parameters   were normal.   Neuro/Psych Anxiety Depression negative neurological ROS     GI/Hepatic GERD  Medicated and Controlled,(+) Hepatitis -, C  Endo/Other  negative endocrine ROS  Renal/GU CRFRenal disease     Musculoskeletal  (+) Arthritis , Osteoarthritis,    Abdominal   Peds  Hematology  (+) anemia ,   Anesthesia Other Findings   Reproductive/Obstetrics                           Lab Results  Component Value Date   CREATININE 3.99 (H) 09/04/2017   BUN 40 (H) 09/04/2017   NA 136 09/04/2017   K 3.4 (L) 09/04/2017   CL 94 (L) 09/04/2017   CO2 27 09/04/2017    Lab Results  Component Value Date   WBC 8.6 09/04/2017   HGB 9.7 (L) 09/04/2017   HCT 29.5 (L) 09/04/2017   MCV 99.0 09/04/2017   PLT 266 09/04/2017    Anesthesia Physical Anesthesia Plan  ASA: III  Anesthesia Plan: General   Post-op Pain Management:    Induction: Intravenous  PONV Risk Score and Plan: Treatment may vary due to age or medical condition  Airway  Management Planned: LMA  Additional Equipment:   Intra-op Plan:   Post-operative Plan:   Informed Consent: I have reviewed the patients History and Physical, chart, labs and discussed the procedure including the risks, benefits and alternatives for the proposed anesthesia with the patient or authorized representative who has indicated his/her understanding and acceptance.     Plan Discussed with: CRNA, Anesthesiologist and Surgeon  Anesthesia Plan Comments:        Anesthesia Quick Evaluation

## 2017-09-04 NOTE — Progress Notes (Addendum)
[facility] Progress Note   Subjective:   Seen and examined post op in PACU. Denies pain. Ready to eat.   Objective Vitals:   09/04/17 1600 09/04/17 1615 09/04/17 1630 09/04/17 1645  BP: 139/82 (!) 158/75 140/82 120/69  Pulse: 72 67 (!) 55 (!) 58  Resp: 16 16 (!) 22 18  Temp:    (!) 97.2 F (36.2 C)  TempSrc:      SpO2: 92% 93% 94% 96%  Weight:      Height:       Physical Exam General:NAD, thin male with multiple tattoos, laying in bed Heart:RRR Lungs:CTAB Abdomen:Soft, NTND Extremities:trace LE edema Dialysis Access: R IJ TDC, LU AVF +b/t   Filed Weights   09/03/17 1125 09/03/17 1700 09/04/17 0341  Weight: 93.4 kg (205 lb 14.6 oz) 91 kg (200 lb 9.9 oz) 89.9 kg (198 lb 3.2 oz)    Intake/Output Summary (Last 24 hours) at 09/04/2017 1735 Last data filed at 09/04/2017 1515 Gross per 24 hour  Intake 1374.66 ml  Output -  Net 1374.66 ml    Additional Objective Labs: Basic Metabolic Panel: Recent Labs  Lab 09/02/17 0338 09/03/17 0352 09/04/17 0422  NA 133* 133* 136  K 3.6 3.4* 3.4*  CL 91* 88* 94*  CO2 27 30 27   GLUCOSE 112* 108* 103*  BUN 65* 73* 40*  CREATININE 5.40* 5.66* 3.99*  CALCIUM 7.5* 7.6* 7.8*  PHOS 7.3* 7.4* 3.9    Recent Labs  Lab 09/02/17 0338 09/03/17 0352 09/04/17 0422  ALBUMIN 3.1* 3.1* 3.3*    Recent Labs  Lab 08/31/17 0427 09/01/17 0407 09/02/17 0338 09/03/17 0352 09/04/17 0422  WBC 6.2 7.7 6.8 6.9 8.6  HGB 9.2* 9.2* 8.5* 8.6* 9.7*  HCT 27.6* 27.8* 25.5* 26.0* 29.5*  MCV 95.8 96.5 97.7 97.0 99.0  PLT 235 265 205 215 266    Recent Labs  Lab 08/31/17 0622 09/01/17 0559 09/02/17 0610 09/03/17 0612 09/04/17 0629  GLUCAP 110* 125* 145* 112* 126*   Lab Results  Component Value Date   INR 1.01 09/04/2017   INR 1.00 09/03/2017   INR 0.99 09/02/2017   Studies/Results: Dg Chest Port 1 View  Result Date: 09/04/2017 CLINICAL DATA:  Vascular dialysis catheter placement EXAM: PORTABLE CHEST 1 VIEW  COMPARISON:  Portable exam 1628 hours compared to 08/27/2017 FINDINGS: New RIGHT jugular central venous catheter tip projecting over SVC. Normal heart size, mediastinal contours, and pulmonary vascularity. Atherosclerotic calcification aorta. Emphysematous and bronchitic changes. No acute infiltrate, pleural effusion or pneumothorax. Bones demineralized. IMPRESSION: No pneumothorax following RIGHT jugular line placement. COPD changes without infiltrate. Electronically Signed   By: Lavonia Dana M.D.   On: 09/04/2017 16:48   Dg Fluoro Guide Cv Line-no Report  Result Date: 09/04/2017 Fluoroscopy was utilized by the requesting physician.  No radiographic interpretation.    Medications: . sodium chloride    . sodium chloride    . sodium chloride 10 mL/hr at 09/04/17 1155  . furosemide 160 mg (09/04/17 0537)   . calcitRIOL  0.25 mcg Oral Q T,Th,Sa-HD  . calcium acetate  1,334 mg Oral TID WC  . carvedilol  12.5 mg Oral BID  . [START ON 09/05/2017] Chlorhexidine Gluconate Cloth  6 each Topical Q0600  . cholecalciferol  2,000 Units Oral Daily  . clopidogrel  75 mg Oral Daily  . darbepoetin (ARANESP) injection - DIALYSIS  100 mcg Intravenous Q Thu-HD  . fluticasone furoate-vilanterol  1 puff Inhalation Daily  . heparin  5,000 Units Subcutaneous  Q8H  . mouth rinse  15 mL Mouth Rinse BID  . metolazone  5 mg Oral Daily  . pantoprazole  40 mg Oral BID  . polymixin-bacitracin   Topical BID  . 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 iliac stenting who was admitted on 08/24/2017 with signif swelling to area including scrotum, worsening of renal function. [place] placed 08/31/17 IR for HD after failing medical management for volume, and for worsening of CKD5.   Assessment/Plan: 1. CKD5 2/2  PKD. CKD worsened in setting of contrast/stenting for PAD. Started HD for new ESRD. 1. LU AVF & R IJ TDC placed today by Dr. Trula Slade 2. HD#1 5/21, #2 5/23, #3 tomorrow 5/25 - orders written 3. CLIP process started - [facility] in [place] TTS starting 5/28 at 11:45am 4. Volume overload- HD/UF + still getting lasix + metolazone. Will D/C 2. Anemia of CKD- Hgb 9.7 pre op follow post op trends.  1. Ordered 4 daily doses Ferrlecit (not on HD so can make sure not missed).  2. Aranesp 17mcg IV qwk given on 5/23 3. Hyperkalemia- resolved K 3.4 today 4. Hyperphosphatemia - started Ca acetate binder 5. Secondary hyperparathyroidism - pth 269, starting calcitriol 0.50mcg w/HD 6. PAD - s/p revasc procedure 08/14/17. [facility] following   Jen Mow, PA-C [facility] Pager: [phone] 09/04/2017,5:35 PM  LOS: 11 days   I have seen and examined this patient and agree with plan and assessment in the above note with renal recommendations/intervention highlighted. HD then discharge tomorrow. Can start at [facility] on Tuesday.  Doyal Saric B,MD 09/04/2017 7:15 PM

## 2017-09-04 NOTE — Progress Notes (Signed)
Spoke w Caryl Pina in HD who states that patient is confirmed at [facility] TTS 2nd shift with initial chair time at 11:45, date not specified.

## 2017-09-04 NOTE — Anesthesia Procedure Notes (Addendum)
Procedure Name: LMA Insertion Date/Time: 09/04/2017 1:56 PM Performed by: Carney Living, CRNA Pre-anesthesia Checklist: Patient identified and Emergency Drugs available Patient Re-evaluated:Patient Re-evaluated prior to induction Oxygen Delivery Method: Circle system utilized Preoxygenation: Pre-oxygenation with 100% oxygen Induction Type: IV induction LMA: LMA inserted LMA Size: 5.0 Number of attempts: 1 Placement Confirmation: positive ETCO2 and breath sounds checked- equal and bilateral Tube secured with: Tape Dental Injury: Teeth and Oropharynx as per pre-operative assessment

## 2017-09-04 NOTE — Op Note (Signed)
Patient name: Aaron [name] MRN: [id number] DOB: May 08, 1955 Sex: male  09/04/2017 Pre-operative Diagnosis: ESRD Post-operative diagnosis:  Same Surgeon:  [name] Assistants:  Arlee Muslim Procedure:   #1: Conversion of right IJ temporary catheter to a tunneled catheter   #2: Left first stage basilic vein fistula creation Anesthesia: General Blood Loss:   minimal Specimens: None  Findings: Catheter tip is at cavoatrial junction.  I first dissected out the cephalic vein at the wrist because of the appeared adequate on ultrasound.  When I divided the vein and found out that it was sclerotic and occluded.  Therefore a first stage basilic vein fistula was done.  Indications: The patient is in need of permanent access as he is a new start dialysis patient.  Interventional radiology has previously placed a temporary catheter.  Procedure:  The patient was identified in the holding area and taken to [place] 11  The patient was then placed supine on the table. general anesthesia was administered.  The patient was prepped and draped in the usual sterile fashion.  A time out was called and antibiotics were administered.  I first began with the catheter.  Using a Bentson wire I placed this through the central line portion of the dialysis catheter.  The catheter was removed and a peel-away sheath was then placed.  A 23 cm Pa catheter was then placed over the wirellindrome and the peel-away sheath was removed along with the wire.  A skin exit site was selected below the clavicle.  A #11 blade was used to make a skin nick and a subcu tunnel was then created.  The catheter was pulled through the tunnel with the cuff situated at the skin exit site.  The ports were then connected to the catheter.  Both flushed and aspirated without difficulty.  Fluoroscopy was used to confirm that the catheter tip was at the cavoatrial junction.  The catheter was sutured into position with 3-0 nylon.  The neck incision  was closed with 4-0 Vicryl followed by Dermabond.  Attention was then turned towards the left arm.  I evaluated the veins in the left arm with ultrasound.  He appeared to have a cephalic vein in the forearm that joined the deep system.  This appeared to be of excellent caliber and therefore I elected to make a a radiocephalic fistula.  I made a longitudinal incision at the level of the wrist and then dissected out the cephalic vein.  It was a healthy 3 mm vein.  And then dissected out the radial artery which was 2 mm and disease-free.  The patient was given 3000 units of heparin.  I ligated the vein distally.  The lumen was a lot smaller than it appeared on ultrasound as well as by visual inspection.  I advanced a dilator into the vein and it would not go very far.  It looked as if the vein was sclerotic and not usable.  I therefore ligated the vein and closed the incision with 2 layers of 3-0 Vicryl.  Attention was then turned towards the upper arm.  I again used ultrasound and identified the basilic vein which was a much better vein measuring about 5 mm.  Made a transverse incision at the antecubital crease.  I dissected out the brachial artery which was a 3 mm disease-free artery.  A large branch of the basilic vein was crossing over the antecubital crease.  I dissected out this vein and mobilized it circumferentially.  It appeared to be very healthy and measured about 4-5 mm.  I ligated this distally.  It was then transected.  It flushed easily with heparin saline.  I then occluded the brachial artery with baby Gregory clamps and used a #11 blade to make an arteriotomy which was extended longitudinally with Potts scissors.  The vein was then cut to the appropriate length.  A end-to-side anastomosis was created with running 6-0 Prolene.  Prior to completion the appropriate flushing maneuvers were performed and the anastomosis was completed.  I inspected the course of the vein to make sure there were no kinks  or other branches.  There was an excellent thrill within the fistula and he had a palpable radial pulse.  25 mg of protamine was given.  Once hemostasis was satisfactory the incision was closed with 2 layers of 3-0 Vicryl followed by Dermabond.   Disposition: To PACU stable   V. [name], M.D. [facility] Office: [phone] Pager:  [phone]

## 2017-09-04 NOTE — Interval H&P Note (Signed)
History and Physical Interval Note:  09/04/2017 12:55 PM  Aaron [name]  has presented today for surgery, with the diagnosis of END STAGE RENAL DISEASE FOR HEMODIAYSIS ACCESS  The various methods of treatment have been discussed with the patient and family. After consideration of risks, benefits and other options for treatment, the patient has consented to  Procedure(s): INSERTION OF TUNNELED  DIALYSIS CATHETER (N/A) ARTERIOVENOUS (AV) FISTULA CREATION LEFT ARM (Left) as a surgical intervention .  The patient's history has been reviewed, patient examined, no change in status, stable for surgery.  I have reviewed the patient's chart and labs.  Questions were answered to the patient's satisfaction.     [name]

## 2017-09-04 NOTE — Progress Notes (Signed)
PROGRESS NOTE    Aaron [name]  TJQ:[id number] DOB: 1955-10-05 DOA: 08/24/2017 PCP: Gwenlyn Saran, [place]   Brief Narrative:  Patient is a62 y.o.male with history of stage IV/stage V chronic kidney disease-who underwent lower extremity angiography on 4/34 critical limb ischemia and a right external iliac, common femoral, superficial femoral artery endarterectomy with patch angioplasty and placement of a stent on 5/3-admitted with anasarca in the setting of worsening renal function due to probable contrast nephropathy. Hemodialysis started on 5/21 after high dose diuretic failed to improve anasarca.See below for further details  Assessment & Plan: Anasarca secondary to worsening renal failure due to probable contrast-induced nephropathy-with likely progression to ESRD:Improvment in anasarca  scrotal edema after initiation of hemodialysis on 5/21, pt did not respond well to high-dose diuretics.Nephrology following-awaiting clipping placement.  He remains on diuretics and though he has been started on HD per nephrology.  Vascular surgery to place permanent dialysis access on 5/24  Paroxysmal atrial fibrillation:Sinus rhythm-continue Coreg-once patient completes all vascular surgery procedure on 5/24-we will resume Coumadin  Peripheral arterial disease:As noted above-on 5/3 underwent endarterectomy of femoral, profundofemoral, SFA and stent placement in right common iliac artery, right external iliac artery, and right superficial femoral artery-remains on Plavix. Vascular surgery following  Hypertension:Controlled with Coreg, metolazone and Lasix.   Anemia:Secondary to chronic kidney disease-continue with darbepoetin per nephrology.   GERD:Continue PPI  History of polycystic kidney disease: See above   DVT prophylaxis: Prophylactic Heparin  Code Status: Full Family Communication: None at bedside  Disposition Plan: Remain inpatient-hopefully home once all  vascular surgery procedures are complete and HD clipping is done as well  Consultants:   Nephrology and Vascular surgery   Procedures:   09/01/17 - Insertion of right internal jugular temporary hemodialysis catheter   Antimicrobials:   Cephalexin 250 every 12 hrs end on 09/02/2017  Cefazolin 1g/38ml started 08/25/17 ended on 08/26/2017  Cefazolin 1g/11mls started 08/24/2017 ended 5/13/219  Subjective: Patient resting in bed this am awaiting next hemodialysis treatment with no new complaints.   Objective: Vitals:   09/03/17 1700 09/03/17 1800 09/03/17 1956 09/04/17 0341  BP: 140/80  (!) 157/93 (!) 141/76  Pulse: 91 97  93  Resp: (!) 33 (!) 25 (!) 22   Temp: 98 F (36.7 C)  98.3 F (36.8 C) 98.9 F (37.2 C)  TempSrc:   Oral Oral  SpO2: 99% 97% 98% 97%  Weight: 91 kg (200 lb 9.9 oz)   89.9 kg (198 lb 3.2 oz)  Height:        Intake/Output Summary (Last 24 hours) at 09/04/2017 1000 Last data filed at 09/03/2017 2100 Gross per 24 hour  Intake 782 ml  Output 2700 ml  Net -1918 ml   Filed Weights   09/03/17 1125 09/03/17 1700 09/04/17 0341  Weight: 93.4 kg (205 lb 14.6 oz) 91 kg (200 lb 9.9 oz) 89.9 kg (198 lb 3.2 oz)    Examination: BP (!) 141/76 (BP Location: Right Arm)   Pulse 93   Temp 98.9 F (37.2 C) (Oral)   Resp (!) 22   Ht 6\' 3"  (1.905 m)   Wt 89.9 kg (198 lb 3.2 oz)   SpO2 97%   BMI 24.77 kg/m  Filed Weights   09/03/17 1125 09/03/17 1700 09/04/17 0341  Weight: 93.4 kg (205 lb 14.6 oz) 91 kg (200 lb 9.9 oz) 89.9 kg (198 lb 3.2 oz)   General appearance:Awake, alert, not in any distress.  Eyes:no scleral icterus. HEENT: Atraumatic and Normocephalic  Neck: supple, no JVD. Resp:Good air entry bilaterally,no rales or rhonchi CVS: S1 S2 regular, no murmurs.  GI: Bowel sounds present, Non tender and not distended with no gaurding, rigidity or rebound. Extremities: B/L Lower Ext shows 1+ edema, both legs are warm to touch Neurology:  Non focal Psychiatric:  Normal judgment and insight. Normal mood. Musculoskeletal:No digital cyanosis Skin:No Rash, warm and dry Wounds:N/A    Data Reviewed: I have personally reviewed following labs and imaging studies  CBC: Recent Labs  Lab 08/31/17 0427 09/01/17 0407 09/02/17 0338 09/03/17 0352 09/04/17 0422  WBC 6.2 7.7 6.8 6.9 8.6  HGB 9.2* 9.2* 8.5* 8.6* 9.7*  HCT 27.6* 27.8* 25.5* 26.0* 29.5*  MCV 95.8 96.5 97.7 97.0 99.0  PLT 235 265 205 215 458   Basic Metabolic Panel: Recent Labs  Lab 08/31/17 0427 09/01/17 0407 09/02/17 0338 09/03/17 0352 09/04/17 0422  NA 131* 132* 133* 133* 136  K 4.1 4.5 3.6 3.4* 3.4*  CL 88* 88* 91* 88* 94*  CO2 25 24 27 30 27   GLUCOSE 98 103* 112* 108* 103*  BUN 82* 89* 65* 73* 40*  CREATININE 6.25* 6.55* 5.40* 5.66* 3.99*  CALCIUM 7.7* 7.3* 7.5* 7.6* 7.8*  PHOS 10.9* 10.9* 7.3* 7.4* 3.9   GFR: Estimated Creatinine Clearance: 23.2 mL/min (A) (by C-G formula based on SCr of 3.99 mg/dL (H)). Liver Function Tests: Recent Labs  Lab 08/31/17 0427 09/01/17 0407 09/02/17 0338 09/03/17 0352 09/04/17 0422  ALBUMIN 3.2* 3.3* 3.1* 3.1* 3.3*   No results for input(s): LIPASE, AMYLASE in the last 168 hours. No results for input(s): AMMONIA in the last 168 hours. Coagulation Profile: Recent Labs  Lab 08/31/17 0427 09/01/17 0407 09/02/17 0338 09/03/17 0352 09/04/17 0422  INR 0.94 >10.00* 0.99 1.00 1.01   Cardiac Enzymes: No results for input(s): CKTOTAL, CKMB, CKMBINDEX, TROPONINI in the last 168 hours. BNP (last 3 results) No results for input(s): PROBNP in the last 8760 hours. HbA1C: No results for input(s): HGBA1C in the last 72 hours. CBG: Recent Labs  Lab 08/30/17 0622 08/31/17 0622 09/01/17 0559 09/02/17 0610 09/03/17 0612  GLUCAP 103* 110* 125* 145* 112*   Lipid Profile: No results for input(s): CHOL, HDL, LDLCALC, TRIG, CHOLHDL, LDLDIRECT in the last 72 hours. Thyroid Function Tests: No results for input(s): TSH, T4TOTAL, FREET4,  T3FREE, THYROIDAB in the last 72 hours. Anemia Panel: No results for input(s): VITAMINB12, FOLATE, FERRITIN, TIBC, IRON, RETICCTPCT in the last 72 hours. Urine analysis:    Component Value Date/Time   COLORURINE STRAW (A) 08/14/2017 0846   APPEARANCEUR CLEAR 08/14/2017 0846   LABSPEC 1.006 08/14/2017 0846   PHURINE 5.0 08/14/2017 0846   GLUCOSEU NEGATIVE 08/14/2017 0846   HGBUR SMALL (A) 08/14/2017 0846   BILIRUBINUR NEGATIVE 08/14/2017 0846   KETONESUR NEGATIVE 08/14/2017 0846   PROTEINUR NEGATIVE 08/14/2017 0846   UROBILINOGEN 0.2 03/03/2010 1056   NITRITE NEGATIVE 08/14/2017 0846   LEUKOCYTESUR NEGATIVE 08/14/2017 0846   Sepsis Labs: @LABRCNTIP (procalcitonin:4,lacticidven:4)  )No results found for this or any previous visit (from the past 240 hour(s)).    Radiology Studies: No results found.   Scheduled Meds: . calcitRIOL  0.25 mcg Oral Q T,Th,Sa-HD  . calcium acetate  1,334 mg Oral TID WC  . carvedilol  12.5 mg Oral BID  . chlorhexidine  60 mL Topical Once  . cholecalciferol  2,000 Units Oral Daily  . clopidogrel  75 mg Oral Daily  . darbepoetin (ARANESP) injection - DIALYSIS  100 mcg Intravenous Q Thu-HD  .  fluticasone furoate-vilanterol  1 puff Inhalation Daily  . heparin  5,000 Units Subcutaneous Q8H  . mouth rinse  15 mL Mouth Rinse BID  . metolazone  5 mg Oral Daily  . pantoprazole  40 mg Oral BID  . polymixin-bacitracin   Topical BID  . Warfarin - Pharmacist Dosing Inpatient   Does not apply q1800   Continuous Infusions: . sodium chloride    . sodium chloride    . sodium chloride 10 mL/hr at 09/04/17 0701  .  ceFAZolin (ANCEF) IV    . furosemide 160 mg (09/04/17 0537)     LOS: 11 days    Time spent:  25 minutes-Greater than 50% of this time was spent in counseling, explanation of diagnosis, planning of further management, and coordination of care.   Nena Alexander MD [facility]   If 7PM-7AM, please contact  night-coverage [email or web address] Password [facility] 09/03/2017, 10:22 AM

## 2017-09-04 NOTE — Transfer of Care (Signed)
Immediate Anesthesia Transfer of Care Note  Patient: Aaron [name]  Procedure(s) Performed: INSERTION OF TUNNELED  DIALYSIS CATHETER - RIGHT INTERNAL JUGULAR PLACEMENT (Right Chest) ARTERIOVENOUS (AV) FISTULA CREATION LEFT UPPER ARM (Left Arm Upper)  Patient Location: PACU  Anesthesia Type:General  Level of Consciousness: awake, alert  and patient cooperative  Airway & Oxygen Therapy: Patient Spontanous Breathing  Post-op Assessment: Report given to RN and Post -op Vital signs reviewed and stable  Post vital signs: Reviewed and stable  Last Vitals:  Vitals Value Taken Time  BP 124/80 09/04/2017  3:33 PM  Temp    Pulse 69 09/04/2017  3:34 PM  Resp 11 09/04/2017  3:34 PM  SpO2 93 % 09/04/2017  3:34 PM  Vitals shown include unvalidated device data.  Last Pain:  Vitals:   09/04/17 0800  TempSrc: Oral  PainSc: 0-No pain      Patients Stated Pain Goal: 0 (30/07/62 2633)  Complications: No apparent anesthesia complications

## 2017-09-04 NOTE — Care Management Important Message (Signed)
Important Message  Patient Details  Name: [name] MRN: [id number] Date of Birth: 1956/01/02   Medicare Important Message Given:  Yes    [name] 09/04/2017, 3:26 PM

## 2017-09-04 NOTE — Progress Notes (Signed)
OR called this nurse to report that transporter on route to unit for patient surgery. Patient IV fluids discontinued and clamped. Patient assisted to bed. No acute distress noted.

## 2017-09-05 LAB — RENAL FUNCTION PANEL
ALBUMIN: 2.7 g/dL — AB (ref 3.5–5.0)
ANION GAP: 13 (ref 5–15)
BUN: 50 mg/dL — ABNORMAL HIGH (ref 6–20)
CALCIUM: 7.5 mg/dL — AB (ref 8.9–10.3)
CO2: 29 mmol/L (ref 22–32)
Chloride: 94 mmol/L — ABNORMAL LOW (ref 101–111)
Creatinine, Ser: 4.64 mg/dL — ABNORMAL HIGH (ref 0.61–1.24)
GFR, EST AFRICAN AMERICAN: 14 mL/min — AB (ref >60)
GFR, EST NON AFRICAN AMERICAN: 12 mL/min — AB (ref >60)
GLUCOSE: 141 mg/dL — AB (ref 65–99)
PHOSPHORUS: 4.9 mg/dL — AB (ref 2.5–4.6)
Potassium: 3.9 mmol/L (ref 3.5–5.1)
SODIUM: 136 mmol/L (ref 135–145)

## 2017-09-05 LAB — GLUCOSE, CAPILLARY: GLUCOSE-CAPILLARY: 124 mg/dL — AB (ref 65–99)

## 2017-09-05 LAB — CBC
HEMATOCRIT: 28.4 % — AB (ref 39.0–52.0)
HEMOGLOBIN: 9 g/dL — AB (ref 13.0–17.0)
MCH: 32.1 pg (ref 26.0–34.0)
MCHC: 31.7 g/dL (ref 30.0–36.0)
MCV: 101.4 fL — ABNORMAL HIGH (ref 78.0–100.0)
Platelets: 211 10*3/uL (ref 150–400)
RBC: 2.8 MIL/uL — AB (ref 4.22–5.81)
RDW: 15.8 % — ABNORMAL HIGH (ref 11.5–15.5)
WBC: 8.4 10*3/uL (ref 4.0–10.5)

## 2017-09-05 LAB — PROTIME-INR
INR: 1.02
PROTHROMBIN TIME: 13.3 s (ref 11.4–15.2)

## 2017-09-05 MED ORDER — CALCITRIOL 0.25 MCG PO CAPS
ORAL_CAPSULE | ORAL | Status: AC
  Filled 2017-09-05: qty 1

## 2017-09-05 MED ORDER — SODIUM CHLORIDE 0.9 % IV SOLN: 100.0000 mL | INTRAVENOUS | Status: DC | PRN

## 2017-09-05 MED ORDER — LIDOCAINE HCL (PF) 1 % IJ SOLN: 5.0000 mL | INTRAMUSCULAR | Status: DC | PRN

## 2017-09-05 MED ORDER — WARFARIN SODIUM 5 MG PO TABS: 7.5000 mg | ORAL_TABLET | Freq: Every day | ORAL | 0 refills | Status: DC

## 2017-09-05 MED ORDER — WARFARIN - PHARMACIST DOSING INPATIENT: Freq: Every day | Status: DC

## 2017-09-05 MED ORDER — CALCITRIOL 0.25 MCG PO CAPS: 0.2500 ug | ORAL_CAPSULE | ORAL | 0 refills | Status: DC

## 2017-09-05 MED ORDER — WARFARIN SODIUM 5 MG PO TABS: 7.5000 mg | ORAL_TABLET | Freq: Once | ORAL | 0 refills | Status: DC

## 2017-09-05 MED ORDER — PENTAFLUOROPROP-TETRAFLUOROETH EX AERO: 1.0000 "application " | INHALATION_SPRAY | CUTANEOUS | Status: DC | PRN

## 2017-09-05 MED ORDER — WARFARIN SODIUM 7.5 MG PO TABS: 7.5000 mg | ORAL_TABLET | Freq: Once | ORAL | Status: DC

## 2017-09-05 MED ORDER — LIDOCAINE-PRILOCAINE 2.5-2.5 % EX CREA: 1.0000 "application " | TOPICAL_CREAM | CUTANEOUS | Status: DC | PRN

## 2017-09-05 MED ORDER — ALTEPLASE 2 MG IJ SOLR: 2.0000 mg | Freq: Once | INTRAMUSCULAR | Status: DC | PRN

## 2017-09-05 MED ORDER — HEPARIN SODIUM (PORCINE) 1000 UNIT/ML DIALYSIS: 1000.0000 [IU] | INTRAMUSCULAR | Status: DC | PRN

## 2017-09-05 NOTE — Plan of Care (Signed)
Care plans reviewed and patient is adequate for discharge.

## 2017-09-05 NOTE — Progress Notes (Signed)
   Daily Progress Note   Assessment/Planning:   POD #1 s/p TDC conversion, L 1st stage BVT   No steal sx  F/U in office 6 weeks   Subjective  - 1 Day Post-Op   C/o incisional pain   Objective   Vitals:   09/04/17 1645 09/04/17 2108 09/05/17 0420 09/05/17 0425  BP: 120/69 133/69    Pulse: (!) 58 67 70   Resp: 18 18 18    Temp: (!) 97.2 F (36.2 C) 98.9 F (37.2 C) 98.8 F (37.1 C)   TempSrc:  Oral    SpO2: 96% 97% 98%   Weight:    202 lb 8 oz (91.9 kg)  Height:        Intake/Output Summary (Last 24 hours) at 09/05/2017 0738 Last data filed at 09/04/2017 1827 Gross per 24 hour  Intake 1556.16 ml  Output -  Net 1556.16 ml   LUE: palpable thrill, +bruit, inc c/d/i in wrist and antecubitum,   PULM: RIJV TDC without active bleeding   Laboratory   CBC CBC Latest Ref Rng & Units 09/05/2017 09/04/2017 09/03/2017  WBC 4.0 - 10.5 K/uL 8.4 8.6 6.9  Hemoglobin 13.0 - 17.0 g/dL 9.0(L) 9.7(L) 8.6(L)  Hematocrit 39.0 - 52.0 % 28.4(L) 29.5(L) 26.0(L)  Platelets 150 - 400 K/uL 211 266 215    BMET    Component Value Date/Time   NA 136 09/05/2017 0340   K 3.9 09/05/2017 0340   CL 94 (L) 09/05/2017 0340   CO2 29 09/05/2017 0340   GLUCOSE 141 (H) 09/05/2017 0340   BUN 50 (H) 09/05/2017 0340   CREATININE 4.64 (H) 09/05/2017 0340   CALCIUM 7.5 (L) 09/05/2017 0340   GFRNONAA 12 (L) 09/05/2017 0340   GFRAA 14 (L) 09/05/2017 0340     Adele Barthel, MD, FACS [facility] Office: [phone] Pager: [phone]  09/05/2017, 7:38 AM

## 2017-09-05 NOTE — Progress Notes (Signed)
ANTICOAGULATION CONSULT NOTE - Initial Consult  Pharmacy Consult for Coumadin Indication: atrial fibrillation  No Known Allergies  Patient Measurements: Height: 6\' 3"  (190.5 cm) Weight: 202 lb 8 oz (91.9 kg) IBW/kg (Calculated) : 84.5  Vital Signs: Temp: 98.8 F (37.1 C) (05/25 0420) Temp Source: Oral (05/24 2108) BP: 133/69 (05/24 2108) Pulse Rate: 70 (05/25 0420)  Labs: Recent Labs    09/03/17 0352 09/04/17 0422 09/05/17 0340  HGB 8.6* 9.7* 9.0*  HCT 26.0* 29.5* 28.4*  PLT 215 266 211  LABPROT 13.1 13.2 13.3  INR 1.00 1.01 1.02  CREATININE 5.66* 3.99* 4.64*    Estimated Creatinine Clearance: 20 mL/min (A) (by C-G formula based on SCr of 4.64 mg/dL (H)).   Medical History: Past Medical History:  Diagnosis Date  . Anxiety   . Arthritis    knees , back , shoulders  . CAD (coronary artery disease)    Mild nonobstructive disease at cardiac catheterization 2002  . Chronic back pain   . CKD (chronic kidney disease), stage II   . COPD (chronic obstructive pulmonary disease) ([place])   . Diverticulitis   . Esophageal reflux   . Essential hypertension   . Hepatitis C    states he no longer has this  . History of kidney stones   . History of syncope   . Hyperlipidemia   . Nephrolithiasis   . PAT (paroxysmal atrial tachycardia) ([place])   . Peripheral arterial disease (H[facility])    Occluded left superficial femoral artery status post stent June 2016 - Dr. Trula Slade  . Pneumonia    as child   37 years old  . Polycystic kidney, unspecified type   . Syncope 09/2014    Medications:  Medications Prior to Admission  Medication Sig Dispense Refill Last Dose  . BREO ELLIPTA 200-25 MCG/INH AEPB Inhale 1 puff into the lungs daily.   08/23/2017 at Unknown time  . carvedilol (COREG) 25 MG tablet Take 1 tablet (25 mg total) 2 (two) times daily by mouth. 180 tablet 3 08/24/2017 at 630a  . cholecalciferol (VITAMIN D) 1000 units tablet Take 2,000 Units by mouth daily.   08/23/2017 at  Unknown time  . clopidogrel (PLAVIX) 75 MG tablet Take 1 tablet (75 mg total) by mouth daily. 30 tablet 3 08/23/2017 at Unknown time  . doxazosin (CARDURA) 8 MG tablet Take 8 mg by mouth at bedtime.    08/23/2017 at Unknown time  . oxyCODONE-acetaminophen (PERCOCET/ROXICET) 5-325 MG tablet Take 1 tablet by mouth every 6 (six) hours as needed. (Patient taking differently: Take 1 tablet by mouth every 3 (three) hours as needed for moderate pain. ) 20 tablet 0 08/23/2017 at Unknown time  . torsemide (DEMADEX) 20 MG tablet Take 1 tablet (20 mg total) by mouth daily. 30 tablet 0 08/23/2017 at Unknown time  . pantoprazole (PROTONIX) 40 MG tablet Take 1 tablet (40 mg total) by mouth 2 (two) times daily. 60 tablet 1 08/14/2017 at 0630    Assessment: 62 y.o. male with h/o Afib to resume Coumadin  Previous home regimen Coumadin 5 mg daily except 2.5 SunThu   Goal of Therapy:  INR 2-3 Monitor platelets by anticoagulation protocol: Yes   Plan:  Coumadin 7.5 mg today Daily INR   Aaron [name], [place] [place] 09/05/2017,8:15 AM

## 2017-09-05 NOTE — Plan of Care (Signed)
Care plans reviewed and patient is progressing.  

## 2017-09-05 NOTE — Discharge Summary (Addendum)
PATIENT DETAILS Name: Aaron [name] Age: 62 y.o. Sex: male Date of Birth: [birth year] MRN: [id number]. Admitting Physician: Reubin Milan, MD ONG:[id number], [place] Date: 08/24/2017 Discharge date: 09/05/2017  Recommendations for Outpatient Follow-up:  1. Follow up with PCP in 1-2 weeks 2. Please obtain BMP/CBC in one week 3. Follow INR-Coumadin restarted on 5/25-check INR on 5/27 or 5/28  Admitted From:  Home   Disposition: [place]: No  Equipment/Devices: None  Discharge Condition: Stable  CODE STATUS: FULL CODE  Diet recommendation:  Heart Healthy  Brief Summary: See H&P, Labs, Consult and Test reports for all details in brief, Patient is a62 y.o.male with history of stage V chronic kidney disease-who underwent lower extremity angiography on 4/34 critical limb ischemia and a right external iliac, common femoral, superficial femoral artery endarterectomy with patch angioplasty and placement of a stent on 5/3-admitted with anasarca in the setting of worsening renal function due to probable contrast nephropathy.Hemodialysis started on 5/21 after high dose diuretic failed to improve anasarca.See below for further details  Brief Hospital Course: Anasarca secondary to worsening CKD stage V due to probable contrast-induced nephropathy-with likely progression to ESRD:Improvment inanasarca scrotal edema after initiation of hemodialysis on 5/21, as noted above pt did not respond well tohigh-dose diuretics.  Nephrology followed closely during this hospital stay, clipping procedure has been completed-he is stable to be discharged home today.  His dialysis schedule will be TTS.  Vascular surgery to place permanent dialysis access on 5/24  Paroxysmal atrial fibrillation:Sinus rhythm-continue Coreg-to be resumed on Coumadin on discharge.  Coumadin was held during this hospital stay as patient had vascular procedures done.  Have asked  patient to follow-up with his cardiologist that he usually follows up with on Monday or Tuesday for a quick INR check.  Peripheral arterial disease:As noted above-on 5/3 underwent endarterectomy of femoral, profundofemoral, SFA and stent placement in right common iliac artery, right external iliac artery, and right superficial femoral artery-remains on Plavix. Vascular surgery following  Hypertension:Controlled with Coreg,  Anemia:Secondary to chronic kidney disease-continue with darbepoetin per nephrology.   GERD:Continue PPI  History of polycystic kidney disease: See above  Procedures/Studies:  09/01/17 - Insertion of right internal jugular temporary hemodialysis catheter  Discharge Diagnoses:  Principal Problem:   Groin swelling Active Problems:   Essential hypertension   Esophageal reflux   SYNCOPE   PAD (peripheral artery disease) (HCC)   COPD (chronic obstructive pulmonary disease) (HCC)   Paroxysmal atrial fibrillation (HCC)   Hyperlipidemia   Volume overload   Discharge Instructions:  Activity:  As tolerated with Full fall precautions use walker/cane & assistance as needed   Discharge Instructions    Call MD for:  persistant nausea and vomiting   Complete by:  As directed    Call MD for:  redness, tenderness, or signs of infection (pain, swelling, redness, odor or green/yellow discharge around incision site)   Complete by:  As directed    Call MD for:  severe uncontrolled pain   Complete by:  As directed    Diet - low sodium heart healthy   Complete by:  As directed    Discharge instructions   Complete by:  As directed    Follow with Primary MD  [facility] in 1 week   Follow-up with your cardiologist office this coming Monday or Tuesday for a INR check.  Follow-up at your new hemodialysis center on Tuesday/Thursday and Saturday  Please get a complete  blood count and chemistry panel checked by your Primary MD at your  next visit, and again as instructed by your Primary MD.  Get Medicines reviewed and adjusted: Please take all your medications with you for your next visit with your Primary MD  Laboratory/radiological data: Please request your Primary MD to go over all hospital tests and procedure/radiological results at the follow up, please ask your Primary MD to get all Hospital records sent to his/her office.  In some cases, they will be blood work, cultures and biopsy results pending at the time of your discharge. Please request that your primary care M.D. follows up on these results.  Also Note the following: If you experience worsening of your admission symptoms, develop shortness of breath, life threatening emergency, suicidal or homicidal thoughts you must seek medical attention immediately by calling 911 or calling your MD immediately  if symptoms less severe.  You must read complete instructions/literature along with all the possible adverse reactions/side effects for all the Medicines you take and that have been prescribed to you. Take any new Medicines after you have completely understood and accpet all the possible adverse reactions/side effects.   Do not drive when taking Pain medications or sleeping medications (Benzodaizepines)  Do not take more than prescribed Pain, Sleep and Anxiety Medications. It is not advisable to combine anxiety,sleep and pain medications without talking with your primary care practitioner  Special Instructions: If you have smoked or chewed Tobacco  in the last 2 yrs please stop smoking, stop any regular Alcohol  and or any Recreational drug use.  Wear Seat belts while driving.  Please note: You were cared for by a hospitalist during your hospital stay. Once you are discharged, your primary care physician will handle any further medical issues. Please note that NO REFILLS for any discharge medications will be authorized once you are discharged, as it is imperative  that you return to your primary care physician (or establish a relationship with a primary care physician if you do not have one) for your post hospital discharge needs so that they can reassess your need for medications and monitor your lab values.   Increase activity slowly   Complete by:  As directed      Allergies as of 09/05/2017   No Known Allergies     Medication List    STOP taking these medications   doxazosin 8 MG tablet Commonly known as:  CARDURA   torsemide 20 MG tablet Commonly known as:  DEMADEX     TAKE these medications   BREO ELLIPTA 200-25 MCG/INH Aepb Generic drug:  fluticasone furoate-vilanterol Inhale 1 puff into the lungs daily.   calcitRIOL 0.25 MCG capsule Commonly known as:  ROCALTROL Take 1 capsule (0.25 mcg total) by mouth Every Tuesday,Thursday,and Saturday with dialysis. Start taking on:  09/08/2017   carvedilol 25 MG tablet Commonly known as:  COREG Take 1 tablet (25 mg total) 2 (two) times daily by mouth.   cholecalciferol 1000 units tablet Commonly known as:  VITAMIN D Take 2,000 Units by mouth daily.   clopidogrel 75 MG tablet Commonly known as:  PLAVIX Take 1 tablet (75 mg total) by mouth daily.   oxyCODONE-acetaminophen 5-325 MG tablet Commonly known as:  PERCOCET/ROXICET Take 1 tablet by mouth every 6 (six) hours as needed. What changed:    when to take this  reasons to take this   pantoprazole 40 MG tablet Commonly known as:  PROTONIX Take 1 tablet (40 mg total) by mouth  2 (two) times daily.   warfarin 5 MG tablet Commonly known as:  COUMADIN Take as directed. If you are unsure how to take this medication, talk to your nurse or doctor. Original instructions:  Take 1.5 tablets (7.5 mg total) by mouth daily at 6 PM.      [place], [facility]. Schedule an appointment as soon as possible for a visit in 1 week(s).   Contact information: [place]  [phone]        HD Clinic Follow up.   Why:  As instructed by nephrology on Tuesdays, Thursdays and Saturdays       Minus Breeding, MD Follow up.   Specialty:  Cardiology Why:  Go this coming Monday or Tuesday for INR check Contact information: [place]  [address] [phone]          No Known Allergies  Consultations:   nephrology and vascular surgery  Other Procedures/Studies: Ct Abdomen Pelvis Wo Contrast  Result Date: 08/27/2017 CLINICAL DATA:  Right leg pain and scrotal swelling. EXAM: CT ABDOMEN AND PELVIS WITHOUT CONTRAST TECHNIQUE: Multidetector CT imaging of the abdomen and pelvis was performed following the standard protocol without IV contrast. COMPARISON:  CT scan of April 27, 2017. FINDINGS: Lower chest: Minimal right posterior basilar subsegmental atelectasis is noted. Hepatobiliary: No gallstones are noted. Stable multiple small hepatic cysts are noted. No biliary dilatation is noted. Pancreas: Unremarkable. No pancreatic ductal dilatation or surrounding inflammatory changes. Spleen: Normal in size without focal abnormality. Adrenals/Urinary Tract: Adrenal glands appear normal. Stable severe polycystic kidney disease is seen bilaterally. No hydronephrosis or renal obstruction is noted. No renal or ureteral calculi are noted. Urinary bladder is unremarkable. Stomach/Bowel: Stomach is within normal limits. Appendix appears normal. No evidence of bowel wall thickening, distention, or inflammatory changes. Sigmoid diverticulosis is noted without inflammation. Vascular/Lymphatic: Aortic atherosclerosis. No enlarged abdominal or pelvic lymph nodes. Stents are noted in the right common and external iliac arteries. Reproductive: Prostate is unremarkable. Other: Mild anasarca is noted. Large amount of fluid is noted in the scrotum. Musculoskeletal: Extensive postsurgical and degenerative changes are seen involving the lumbar spine. No acute  abnormality is noted. IMPRESSION: Stable severe polycystic kidney disease is noted, with stable cysts seen throughout hepatic parenchyma most consistent with associated polycystic liver disease. Sigmoid diverticulosis without inflammation. Mild anasarca is noted. Large amount of fluid is noted in the scrotum. Aortic Atherosclerosis (ICD10-I70.0). Electronically Signed   By: Marijo Conception, M.D.   On: 08/27/2017 11:53   Dg Chest 2 View  Result Date: 08/27/2017 CLINICAL DATA:  CHF. Shortness of breath, cough and testicular swelling EXAM: CHEST - 2 VIEW COMPARISON:  Chest x-rays dated 09/22/2014 and 03/01/2013 FINDINGS: Heart size and mediastinal contours are within normal limits. Lungs are hyperexpanded. Lungs are clear. No pleural effusion or pneumothorax seen. Osseous structures about the chest are unremarkable. IMPRESSION: 1. No active cardiopulmonary disease. No evidence of pneumonia or pulmonary edema. 2. Hyperexpanded lungs indicating COPD. Electronically Signed   By: Franki Cabot M.D.   On: 08/27/2017 20:02   Ct Head Wo Contrast  Result Date: 08/24/2017 CLINICAL DATA:  Fall last night, bilateral diplopia starting today. EXAM: CT HEAD WITHOUT CONTRAST TECHNIQUE: Contiguous axial images were obtained from the base of the skull through the vertex without intravenous contrast. COMPARISON:  Head CT dated 09/22/2014. FINDINGS: Brain: Mild generalized age related parenchymal atrophy with commensurate dilatation of the ventricles and sulci. Ventricles  are stable in size and configuration. Again noted is a prominent CSF space adjacent to the RIGHT temporal lobe, compatible with chronic arachnoid cyst. There is no parenchymal mass, hemorrhage or edema appreciated. No extra-axial hemorrhage. Vascular: There are chronic calcified atherosclerotic changes of the large vessels at the skull base. No unexpected hyperdense vessel. Skull: Normal. Negative for fracture or focal lesion. Sinuses/Orbits: Chronic RIGHT  maxillary sinus disease. No acute findings. Other: None. IMPRESSION: 1. No acute findings. No intracranial hemorrhage or edema. No skull fracture. 2. Chronic RIGHT maxillary sinus disease. Electronically Signed   By: Franki Cabot M.D.   On: 08/24/2017 21:05   US Pelvis Limited (transabdominal Only)  Result Date: 08/25/2017 CLINICAL DATA:  Inguinal hernia EXAM: LIMITED ULTRASOUND OF PELVIS TECHNIQUE: Limited transabdominal ultrasound examination of the pelvis was performed. COMPARISON:  CT abdomen and pelvis 04/27/2017 FINDINGS: On prior CT patient has a fat containing LEFT inguinal hernia. Prior CT also demonstrates a probable prior RIGHT inguinal hernia repair. On sonography, echogenic masslike focus is identified at the RIGHT inguinal region which may may either represent sequela/deformity as a result of prior RIGHT inguinal herniorrhaphy or a recurrent RIGHT inguinal hernia. On the LEFT, a heterogeneous focus is seen at the LEFT inguinal canal suspicious for a LEFT inguinal hernia containing fat and fluid. No mass or adenopathy identified. IMPRESSION: Known LEFT inguinal hernia by prior CT. Apparent prior RIGHT inguinal hernia repair. Sonography demonstrates a LEFT inguinal hernia and either postsurgical changes or potential recurrent hernia at the RIGHT inguinal region; further evaluation by a noncontrast CT exam would be beneficial in assessing for potential recurrent RIGHT inguinal hernia. Electronically Signed   By: Lavonia Dana M.D.   On: 08/25/2017 10:16   Ct Femur Right Wo Contrast  Result Date: 08/27/2017 CLINICAL DATA:  Scrotal and right leg swelling. Recent right CFA endarterectomy and stenting 10 days ago. EXAM: CT OF THE LOWER RIGHT EXTREMITY WITHOUT CONTRAST TECHNIQUE: Multidetector CT imaging of the right lower extremity was performed according to the standard protocol. COMPARISON:  None. FINDINGS: Bones/Joint/Cartilage No acute fracture or dislocation. Severe osteoarthritis of the right hip  joint. Ligaments Suboptimally assessed by CT. Muscles and Tendons Unremarkable. Soft tissues Right external iliac, right proximal SFA, and distal left SFA stents. Atherosclerotic vascular calcifications. Postsurgical changes in the right groin with small right inguinal fluid collection. Diffuse anasarca. Moderate right and mild left thigh circumferential soft tissue swelling. Severe scrotal and penile edema. IMPRESSION: 1. Postsurgical changes in the right groin with small right inguinal simple appearing fluid collection, favoring seroma. 2. Diffuse anasarca.  Severe scrotal edema. 3. Severe right hip osteoarthritis.  No acute osseous abnormality. Electronically Signed   By: Titus Dubin M.D.   On: 08/27/2017 12:07   Ir Fluoro Guide Cv Line Right  Result Date: 08/31/2017 INDICATION: Acute renal failure EXAM: TUNNELED DIALYSIS CATHETER PLACEMENT, ULTRASOUND GUIDANCE FOR VASCULAR ACCESS MEDICATIONS: None ANESTHESIA/SEDATION: None FLUOROSCOPY TIME:  Fluoroscopy Time:  minutes 6 seconds (0 mGy). COMPLICATIONS: None immediate. PROCEDURE: Informed written consent was obtained from the patient after a thorough discussion of the procedural risks, benefits and alternatives. All questions were addressed. Maximal Sterile Barrier Technique was utilized including caps, mask, sterile gowns, sterile gloves, sterile drape, hand hygiene and skin antiseptic. A timeout was performed prior to the initiation of the procedure. The right neck was prepped with ChloraPrep in a sterile fashion, and a sterile drape was applied covering the operative field. A sterile gown and sterile gloves were used for the procedure. 1%  lidocaine into the skin and subcutaneous tissue. The right jugular vein was noted to be patent initially with ultrasound. Under sonographic guidance, a micropuncture needle was inserted into the right IJ vein (Ultrasound and fluoroscopic image documentation was performed). It was removed over an 018 wire which was  up-sized to an Amplatz. This was advanced into the IVC. The 12 French dilator followed by the 12 [place] temporary dialysis catheter was advanced over the wire. It was flushed and sewn in place with an 0 Prolene knot. FINDINGS: The tip of the temporary dialysis catheter is at the cavoatrial junction. IMPRESSION: Successful right IJ vein temporary dialysis catheter with its tip at the cavoatrial junction. Electronically Signed   By: Marybelle Killings M.D.   On: 08/31/2017 16:09   Ir US Guide Vasc Access Right  Result Date: 08/31/2017 INDICATION: Acute renal failure EXAM: TUNNELED DIALYSIS CATHETER PLACEMENT, ULTRASOUND GUIDANCE FOR VASCULAR ACCESS MEDICATIONS: None ANESTHESIA/SEDATION: None FLUOROSCOPY TIME:  Fluoroscopy Time:  minutes 6 seconds (0 mGy). COMPLICATIONS: None immediate. PROCEDURE: Informed written consent was obtained from the patient after a thorough discussion of the procedural risks, benefits and alternatives. All questions were addressed. Maximal Sterile Barrier Technique was utilized including caps, mask, sterile gowns, sterile gloves, sterile drape, hand hygiene and skin antiseptic. A timeout was performed prior to the initiation of the procedure. The right neck was prepped with ChloraPrep in a sterile fashion, and a sterile drape was applied covering the operative field. A sterile gown and sterile gloves were used for the procedure. 1% lidocaine into the skin and subcutaneous tissue. The right jugular vein was noted to be patent initially with ultrasound. Under sonographic guidance, a micropuncture needle was inserted into the right IJ vein (Ultrasound and fluoroscopic image documentation was performed). It was removed over an 018 wire which was up-sized to an Amplatz. This was advanced into the IVC. The 12 French dilator followed by the 12 [place] temporary dialysis catheter was advanced over the wire. It was flushed and sewn in place with an 0 Prolene knot. FINDINGS: The tip of the temporary  dialysis catheter is at the cavoatrial junction. IMPRESSION: Successful right IJ vein temporary dialysis catheter with its tip at the cavoatrial junction. Electronically Signed   By: Marybelle Killings M.D.   On: 08/31/2017 16:09   Dg Chest Port 1 View  Result Date: 09/04/2017 CLINICAL DATA:  Vascular dialysis catheter placement EXAM: PORTABLE CHEST 1 VIEW COMPARISON:  Portable exam 1628 hours compared to 08/27/2017 FINDINGS: New RIGHT jugular central venous catheter tip projecting over SVC. Normal heart size, mediastinal contours, and pulmonary vascularity. Atherosclerotic calcification aorta. Emphysematous and bronchitic changes. No acute infiltrate, pleural effusion or pneumothorax. Bones demineralized. IMPRESSION: No pneumothorax following RIGHT jugular line placement. COPD changes without infiltrate. Electronically Signed   By: Lavonia Dana M.D.   On: 09/04/2017 16:48   Dg Fluoro Guide Cv Line-no Report  Result Date: 09/04/2017 Fluoroscopy was utilized by the requesting physician.  No radiographic interpretation.     TODAY-DAY OF DISCHARGE:  Subjective:   Aaron [name] today has no headache,no chest abdominal pain,no new weakness tingling or numbness, feels much better wants to go home today.   Objective:   Blood pressure (!) 149/85, pulse (!) 59, temperature 98.3 F (36.8 C), temperature source Oral, resp. rate 18, height 6\' 3"  (1.905 m), weight 91 kg (200 lb 9.9 oz), SpO2 95 %.  Intake/Output Summary (Last 24 hours) at 09/05/2017 1431 Last data filed at 09/05/2017 1152 Gross per 24 hour  Intake  987.5 ml  Output 2000 ml  Net -1012.5 ml   Filed Weights   09/04/17 0341 09/05/17 0425 09/05/17 0810  Weight: 89.9 kg (198 lb 3.2 oz) 91.9 kg (202 lb 8 oz) 91 kg (200 lb 9.9 oz)    Exam: Awake Alert, Oriented *3, No new F.N deficits, Normal affect [place].AT,PERRAL Supple Neck,No JVD, No cervical lymphadenopathy appriciated.  Symmetrical Chest wall movement, Good air movement bilaterally,  CTAB RRR,No Gallops,Rubs or new Murmurs, No Parasternal Heave +ve B.Sounds, Abd Soft, Non tender, No organomegaly appriciated, No rebound -guarding or rigidity. No Cyanosis, Clubbing or edema, No new Rash or bruise   PERTINENT RADIOLOGIC STUDIES: Ct Abdomen Pelvis Wo Contrast  Result Date: 08/27/2017 CLINICAL DATA:  Right leg pain and scrotal swelling. EXAM: CT ABDOMEN AND PELVIS WITHOUT CONTRAST TECHNIQUE: Multidetector CT imaging of the abdomen and pelvis was performed following the standard protocol without IV contrast. COMPARISON:  CT scan of April 27, 2017. FINDINGS: Lower chest: Minimal right posterior basilar subsegmental atelectasis is noted. Hepatobiliary: No gallstones are noted. Stable multiple small hepatic cysts are noted. No biliary dilatation is noted. Pancreas: Unremarkable. No pancreatic ductal dilatation or surrounding inflammatory changes. Spleen: Normal in size without focal abnormality. Adrenals/Urinary Tract: Adrenal glands appear normal. Stable severe polycystic kidney disease is seen bilaterally. No hydronephrosis or renal obstruction is noted. No renal or ureteral calculi are noted. Urinary bladder is unremarkable. Stomach/Bowel: Stomach is within normal limits. Appendix appears normal. No evidence of bowel wall thickening, distention, or inflammatory changes. Sigmoid diverticulosis is noted without inflammation. Vascular/Lymphatic: Aortic atherosclerosis. No enlarged abdominal or pelvic lymph nodes. Stents are noted in the right common and external iliac arteries. Reproductive: Prostate is unremarkable. Other: Mild anasarca is noted. Large amount of fluid is noted in the scrotum. Musculoskeletal: Extensive postsurgical and degenerative changes are seen involving the lumbar spine. No acute abnormality is noted. IMPRESSION: Stable severe polycystic kidney disease is noted, with stable cysts seen throughout hepatic parenchyma most consistent with associated polycystic liver disease.  Sigmoid diverticulosis without inflammation. Mild anasarca is noted. Large amount of fluid is noted in the scrotum. Aortic Atherosclerosis (ICD10-I70.0). Electronically Signed   By: Marijo Conception, M.D.   On: 08/27/2017 11:53   Dg Chest 2 View  Result Date: 08/27/2017 CLINICAL DATA:  CHF. Shortness of breath, cough and testicular swelling EXAM: CHEST - 2 VIEW COMPARISON:  Chest x-rays dated 09/22/2014 and 03/01/2013 FINDINGS: Heart size and mediastinal contours are within normal limits. Lungs are hyperexpanded. Lungs are clear. No pleural effusion or pneumothorax seen. Osseous structures about the chest are unremarkable. IMPRESSION: 1. No active cardiopulmonary disease. No evidence of pneumonia or pulmonary edema. 2. Hyperexpanded lungs indicating COPD. Electronically Signed   By: Franki Cabot M.D.   On: 08/27/2017 20:02   Ct Head Wo Contrast  Result Date: 08/24/2017 CLINICAL DATA:  Fall last night, bilateral diplopia starting today. EXAM: CT HEAD WITHOUT CONTRAST TECHNIQUE: Contiguous axial images were obtained from the base of the skull through the vertex without intravenous contrast. COMPARISON:  Head CT dated 09/22/2014. FINDINGS: Brain: Mild generalized age related parenchymal atrophy with commensurate dilatation of the ventricles and sulci. Ventricles are stable in size and configuration. Again noted is a prominent CSF space adjacent to the RIGHT temporal lobe, compatible with chronic arachnoid cyst. There is no parenchymal mass, hemorrhage or edema appreciated. No extra-axial hemorrhage. Vascular: There are chronic calcified atherosclerotic changes of the large vessels at the skull base. No unexpected hyperdense vessel. Skull: Normal. Negative for fracture  or focal lesion. Sinuses/Orbits: Chronic RIGHT maxillary sinus disease. No acute findings. Other: None. IMPRESSION: 1. No acute findings. No intracranial hemorrhage or edema. No skull fracture. 2. Chronic RIGHT maxillary sinus disease.  Electronically Signed   By: Franki Cabot M.D.   On: 08/24/2017 21:05   US Pelvis Limited (transabdominal Only)  Result Date: 08/25/2017 CLINICAL DATA:  Inguinal hernia EXAM: LIMITED ULTRASOUND OF PELVIS TECHNIQUE: Limited transabdominal ultrasound examination of the pelvis was performed. COMPARISON:  CT abdomen and pelvis 04/27/2017 FINDINGS: On prior CT patient has a fat containing LEFT inguinal hernia. Prior CT also demonstrates a probable prior RIGHT inguinal hernia repair. On sonography, echogenic masslike focus is identified at the RIGHT inguinal region which may may either represent sequela/deformity as a result of prior RIGHT inguinal herniorrhaphy or a recurrent RIGHT inguinal hernia. On the LEFT, a heterogeneous focus is seen at the LEFT inguinal canal suspicious for a LEFT inguinal hernia containing fat and fluid. No mass or adenopathy identified. IMPRESSION: Known LEFT inguinal hernia by prior CT. Apparent prior RIGHT inguinal hernia repair. Sonography demonstrates a LEFT inguinal hernia and either postsurgical changes or potential recurrent hernia at the RIGHT inguinal region; further evaluation by a noncontrast CT exam would be beneficial in assessing for potential recurrent RIGHT inguinal hernia. Electronically Signed   By: Lavonia Dana M.D.   On: 08/25/2017 10:16   Ct Femur Right Wo Contrast  Result Date: 08/27/2017 CLINICAL DATA:  Scrotal and right leg swelling. Recent right CFA endarterectomy and stenting 10 days ago. EXAM: CT OF THE LOWER RIGHT EXTREMITY WITHOUT CONTRAST TECHNIQUE: Multidetector CT imaging of the right lower extremity was performed according to the standard protocol. COMPARISON:  None. FINDINGS: Bones/Joint/Cartilage No acute fracture or dislocation. Severe osteoarthritis of the right hip joint. Ligaments Suboptimally assessed by CT. Muscles and Tendons Unremarkable. Soft tissues Right external iliac, right proximal SFA, and distal left SFA stents. Atherosclerotic vascular  calcifications. Postsurgical changes in the right groin with small right inguinal fluid collection. Diffuse anasarca. Moderate right and mild left thigh circumferential soft tissue swelling. Severe scrotal and penile edema. IMPRESSION: 1. Postsurgical changes in the right groin with small right inguinal simple appearing fluid collection, favoring seroma. 2. Diffuse anasarca.  Severe scrotal edema. 3. Severe right hip osteoarthritis.  No acute osseous abnormality. Electronically Signed   By: Titus Dubin M.D.   On: 08/27/2017 12:07   Ir Fluoro Guide Cv Line Right  Result Date: 08/31/2017 INDICATION: Acute renal failure EXAM: TUNNELED DIALYSIS CATHETER PLACEMENT, ULTRASOUND GUIDANCE FOR VASCULAR ACCESS MEDICATIONS: None ANESTHESIA/SEDATION: None FLUOROSCOPY TIME:  Fluoroscopy Time:  minutes 6 seconds (0 mGy). COMPLICATIONS: None immediate. PROCEDURE: Informed written consent was obtained from the patient after a thorough discussion of the procedural risks, benefits and alternatives. All questions were addressed. Maximal Sterile Barrier Technique was utilized including caps, mask, sterile gowns, sterile gloves, sterile drape, hand hygiene and skin antiseptic. A timeout was performed prior to the initiation of the procedure. The right neck was prepped with ChloraPrep in a sterile fashion, and a sterile drape was applied covering the operative field. A sterile gown and sterile gloves were used for the procedure. 1% lidocaine into the skin and subcutaneous tissue. The right jugular vein was noted to be patent initially with ultrasound. Under sonographic guidance, a micropuncture needle was inserted into the right IJ vein (Ultrasound and fluoroscopic image documentation was performed). It was removed over an 018 wire which was up-sized to an Amplatz. This was advanced into the IVC. The 12  [place] dilator followed by the 12 [place] temporary dialysis catheter was advanced over the wire. It was flushed and sewn in place  with an 0 Prolene knot. FINDINGS: The tip of the temporary dialysis catheter is at the cavoatrial junction. IMPRESSION: Successful right IJ vein temporary dialysis catheter with its tip at the cavoatrial junction. Electronically Signed   By: Marybelle Killings M.D.   On: 08/31/2017 16:09   Ir US Guide Vasc Access Right  Result Date: 08/31/2017 INDICATION: Acute renal failure EXAM: TUNNELED DIALYSIS CATHETER PLACEMENT, ULTRASOUND GUIDANCE FOR VASCULAR ACCESS MEDICATIONS: None ANESTHESIA/SEDATION: None FLUOROSCOPY TIME:  Fluoroscopy Time:  minutes 6 seconds (0 mGy). COMPLICATIONS: None immediate. PROCEDURE: Informed written consent was obtained from the patient after a thorough discussion of the procedural risks, benefits and alternatives. All questions were addressed. Maximal Sterile Barrier Technique was utilized including caps, mask, sterile gowns, sterile gloves, sterile drape, hand hygiene and skin antiseptic. A timeout was performed prior to the initiation of the procedure. The right neck was prepped with ChloraPrep in a sterile fashion, and a sterile drape was applied covering the operative field. A sterile gown and sterile gloves were used for the procedure. 1% lidocaine into the skin and subcutaneous tissue. The right jugular vein was noted to be patent initially with ultrasound. Under sonographic guidance, a micropuncture needle was inserted into the right IJ vein (Ultrasound and fluoroscopic image documentation was performed). It was removed over an 018 wire which was up-sized to an Amplatz. This was advanced into the IVC. The 12 [place] dilator followed by the 12 [place] temporary dialysis catheter was advanced over the wire. It was flushed and sewn in place with an 0 Prolene knot. FINDINGS: The tip of the temporary dialysis catheter is at the cavoatrial junction. IMPRESSION: Successful right IJ vein temporary dialysis catheter with its tip at the cavoatrial junction. Electronically Signed   By: Marybelle Killings  M.D.   On: 08/31/2017 16:09   Dg Chest Port 1 View  Result Date: 09/04/2017 CLINICAL DATA:  Vascular dialysis catheter placement EXAM: PORTABLE CHEST 1 VIEW COMPARISON:  Portable exam 1628 hours compared to 08/27/2017 FINDINGS: New RIGHT jugular central venous catheter tip projecting over SVC. Normal heart size, mediastinal contours, and pulmonary vascularity. Atherosclerotic calcification aorta. Emphysematous and bronchitic changes. No acute infiltrate, pleural effusion or pneumothorax. Bones demineralized. IMPRESSION: No pneumothorax following RIGHT jugular line placement. COPD changes without infiltrate. Electronically Signed   By: Lavonia Dana M.D.   On: 09/04/2017 16:48   Dg Fluoro Guide Cv Line-no Report  Result Date: 09/04/2017 Fluoroscopy was utilized by the requesting physician.  No radiographic interpretation.     PERTINENT LAB RESULTS: CBC: Recent Labs    09/04/17 0422 09/05/17 0340  WBC 8.6 8.4  HGB 9.7* 9.0*  HCT 29.5* 28.4*  PLT 266 211   CMET CMP     Component Value Date/Time   NA 136 09/05/2017 0340   K 3.9 09/05/2017 0340   CL 94 (L) 09/05/2017 0340   CO2 29 09/05/2017 0340   GLUCOSE 141 (H) 09/05/2017 0340   BUN 50 (H) 09/05/2017 0340   CREATININE 4.64 (H) 09/05/2017 0340   CALCIUM 7.5 (L) 09/05/2017 0340   PROT 6.2 (L) 08/24/2017 1808   ALBUMIN 2.7 (L) 09/05/2017 0340   AST 17 08/24/2017 1808   ALT 7 (L) 08/24/2017 1808   ALKPHOS 93 08/24/2017 1808   BILITOT 0.4 08/24/2017 1808   GFRNONAA 12 (L) 09/05/2017 0340   GFRAA 14 (L) 09/05/2017 0340  GFR Estimated Creatinine Clearance: 20 mL/min (A) (by C-G formula based on SCr of 4.64 mg/dL (H)). No results for input(s): LIPASE, AMYLASE in the last 72 hours. No results for input(s): CKTOTAL, CKMB, CKMBINDEX, TROPONINI in the last 72 hours. Invalid input(s): POCBNP No results for input(s): DDIMER in the last 72 hours. No results for input(s): HGBA1C in the last 72 hours. No results for input(s): CHOL,  HDL, LDLCALC, TRIG, CHOLHDL, LDLDIRECT in the last 72 hours. No results for input(s): TSH, T4TOTAL, T3FREE, THYROIDAB in the last 72 hours.  Invalid input(s): FREET3 No results for input(s): VITAMINB12, FOLATE, FERRITIN, TIBC, IRON, RETICCTPCT in the last 72 hours. Coags: Recent Labs    09/04/17 0422 09/05/17 0340  INR 1.01 1.02   Microbiology: No results found for this or any previous visit (from the past 240 hour(s)).  FURTHER DISCHARGE INSTRUCTIONS:  Get Medicines reviewed and adjusted: Please take all your medications with you for your next visit with your Primary MD  Laboratory/radiological data: Please request your Primary MD to go over all hospital tests and procedure/radiological results at the follow up, please ask your Primary MD to get all Hospital records sent to his/her office.  In some cases, they will be blood work, cultures and biopsy results pending at the time of your discharge. Please request that your primary care M.D. goes through all the records of your hospital data and follows up on these results.  Also Note the following: If you experience worsening of your admission symptoms, develop shortness of breath, life threatening emergency, suicidal or homicidal thoughts you must seek medical attention immediately by calling 911 or calling your MD immediately  if symptoms less severe.  You must read complete instructions/literature along with all the possible adverse reactions/side effects for all the Medicines you take and that have been prescribed to you. Take any new Medicines after you have completely understood and accpet all the possible adverse reactions/side effects.   Do not drive when taking Pain medications or sleeping medications (Benzodaizepines)  Do not take more than prescribed Pain, Sleep and Anxiety Medications. It is not advisable to combine anxiety,sleep and pain medications without talking with your primary care practitioner  Special  Instructions: If you have smoked or chewed Tobacco  in the last 2 yrs please stop smoking, stop any regular Alcohol  and or any Recreational drug use.  Wear Seat belts while driving.  Please note: You were cared for by a hospitalist during your hospital stay. Once you are discharged, your primary care physician will handle any further medical issues. Please note that NO REFILLS for any discharge medications will be authorized once you are discharged, as it is imperative that you return to your primary care physician (or establish a relationship with a primary care physician if you do not have one) for your post hospital discharge needs so that they can reassess your need for medications and monitor your lab values.  Total Time spent coordinating discharge including counseling, education and face to face time equals 35 minutes.  SignedOren Binet 09/05/2017 2:31 PM

## 2017-09-05 NOTE — Progress Notes (Addendum)
[facility] Progress Note   Subjective:    Seen in HD Ready to go home after HD  Objective Vitals:   09/05/17 0810 09/05/17 0822 09/05/17 0827 09/05/17 0900  BP: (!) 145/76 (!) 141/72 137/76 (!) 143/67  Pulse: (!) 55 (!) 58 (!) 59 (!) 58  Resp: (!) 21 (!) 22 16 17   Temp: 98.1 F (36.7 C)     TempSrc: Oral     SpO2: 94%     Weight: 91 kg (200 lb 9.9 oz)     Height:       Physical Exam Seen in HD NAD, thin male with multiple tattoos, laying in bed Regular S1S2 No S3 Lungs clear Soft NT abdomen Only trace edema. Dialysis Access: R IJ TDC, LU AVF +b/t incision clean and dry  Filed Weights   09/04/17 0341 09/05/17 0425 09/05/17 0810  Weight: 89.9 kg (198 lb 3.2 oz) 91.9 kg (202 lb 8 oz) 91 kg (200 lb 9.9 oz)    Intake/Output Summary (Last 24 hours) at 09/05/2017 1019 Last data filed at 09/05/2017 1000 Gross per 24 hour  Intake 1676.16 ml  Output -  Net 1676.16 ml    Recent Labs  Lab 09/03/17 0352 09/04/17 0422 09/05/17 0340  NA 133* 136 136  K 3.4* 3.4* 3.9  CL 88* 94* 94*  CO2 30 27 29   GLUCOSE 108* 103* 141*  BUN 73* 40* 50*  CREATININE 5.66* 3.99* 4.64*  CALCIUM 7.6* 7.8* 7.5*  PHOS 7.4* 3.9 4.9*    Recent Labs  Lab 09/03/17 0352 09/04/17 0422 09/05/17 0340  ALBUMIN 3.1* 3.3* 2.7*    Recent Labs  Lab 09/01/17 0407 09/02/17 0338 09/03/17 0352 09/04/17 0422 09/05/17 0340  WBC 7.7 6.8 6.9 8.6 8.4  HGB 9.2* 8.5* 8.6* 9.7* 9.0*  HCT 27.8* 25.5* 26.0* 29.5* 28.4*  MCV 96.5 97.7 97.0 99.0 101.4*  PLT 265 205 215 266 211    Recent Labs  Lab 09/01/17 0559 09/02/17 0610 09/03/17 0612 09/04/17 0629 09/05/17 0630  GLUCAP 125* 145* 112* 126* 124*   Lab Results  Component Value Date   INR 1.02 09/05/2017   INR 1.01 09/04/2017   INR 1.00 09/03/2017   Studies/Results: Dg Chest Port 1 View  Result Date: 09/04/2017 CLINICAL DATA:  Vascular dialysis catheter placement EXAM: PORTABLE CHEST 1 VIEW COMPARISON:  Portable exam  1628 hours compared to 08/27/2017 FINDINGS: New RIGHT jugular central venous catheter tip projecting over SVC. Normal heart size, mediastinal contours, and pulmonary vascularity. Atherosclerotic calcification aorta. Emphysematous and bronchitic changes. No acute infiltrate, pleural effusion or pneumothorax. Bones demineralized. IMPRESSION: No pneumothorax following RIGHT jugular line placement. COPD changes without infiltrate. Electronically Signed   By: Lavonia Dana M.D.   On: 09/04/2017 16:48   Medications: . sodium chloride    . sodium chloride    . sodium chloride 10 mL/hr at 09/04/17 1155   . calcitRIOL  0.25 mcg Oral Q T,Th,Sa-HD  . calcium acetate  1,334 mg Oral TID WC  . carvedilol  12.5 mg Oral BID  . Chlorhexidine Gluconate Cloth  6 each Topical Q0600  . cholecalciferol  2,000 Units Oral Daily  . clopidogrel  75 mg Oral Daily  . darbepoetin (ARANESP) injection - DIALYSIS  100 mcg Intravenous Q Thu-HD  . fluticasone furoate-vilanterol  1 puff Inhalation Daily  . heparin  5,000 Units Subcutaneous Q8H  . mouth rinse  15 mL Mouth Rinse BID  . pantoprazole  40 mg Oral BID  . polymixin-bacitracin  Topical BID  . warfarin  7.5 mg Oral ONCE-1800  . 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 iliac stenting who was admitted on 08/24/2017 with signif swelling to area including scrotum, worsening of renal function. [place] placed 08/31/17 IR for HD after failing medical management for volume. New ESRD  Assessment/Plan:  1. 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 CKD worsened in setting of contrast/stenting for PAD. Started HD for new ESRD. 1. LU AVF & R IJ TDC placed today by Dr. Trula Slade 2. HD#1 5/21, #2 5/23, #3 5/25  3. CLIPPED to [facility] in [place] TTS starting 5/28 at 11:45am 4. Volume overload-  HD/UF Marked improvement. Diuretics stopped. 5. Post weight today will be EDW 2. Anemia of CKD  1. Ordered 4 daily doses Ferrlecit (not on HD so can make sure not missed). Finished. 2. Aranesp 112mcg IV qwk given on 5/23 3. Hyperkalemia- resolved K 3.4 today 4. Hyperphosphatemia - started Ca acetate binder 5. Secondary hyperparathyroidism - pth 269, starting calcitriol 0.75mcg w/HD 6. PAD - s/p revasc procedure 08/14/17. VVS following 7. Parox AFib with coumadin Anticoagulation - he will need someone to monitor his coumadin - cannot be done at the outpt HD unit  OK for discharge post HD today. Start HD at [facility] on Tuesday 11:45AM. To have VVS f/u 8 weeks per pt. Will need arrangements made for coumadin monitoring as cannot be done outpt HD unit.  Aaron Maes, MD [facility] [phone] Pager 09/05/2017, 10:21 AM

## 2017-09-05 NOTE — Procedures (Signed)
I have personally attended this patient's dialysis session.   4K bath Outpt will have 4 hour HD time Post weight to be be EDW  Jamal Maes, MD [facility] Pager 09/05/2017, 10:36 AM

## 2017-09-07 ENCOUNTER — Encounter (HOSPITAL_COMMUNITY): Payer: Self-pay | Admitting: Surgery

## 2017-09-08 ENCOUNTER — Telehealth: Payer: Self-pay | Admitting: Vascular Surgery

## 2017-09-08 NOTE — Telephone Encounter (Signed)
sch appt lvm 10/21/17 1pm p/o PA s/p 1st BVT per stf msg

## 2017-09-08 NOTE — Telephone Encounter (Signed)
-----   Message from Mena Goes, RN sent at 09/07/2017  5:06 PM EDT ----- Regarding: 6 weeks PA clinic   ----- Message ----- From: Conrad Cedaredge, MD Sent: 09/05/2017   7:40 AM To: [address]  COSTAS SENA [phone] 08-17-1955  F/U: 6 weeks PA clinic s/p L 1st BVT

## 2017-09-08 NOTE — Telephone Encounter (Signed)
-----   Message from Mena Goes, RN sent at 09/07/2017  5:06 PM EDT ----- Regarding: 6 weeks PA clinic   ----- Message ----- From: Conrad [place], MD Sent: 09/05/2017   7:40 AM To: [address]  [name] [phone] 1955/07/24  F/U: 6 weeks PA clinic s/p L 1st BVT

## 2017-09-08 NOTE — Anesthesia Postprocedure Evaluation (Signed)
Anesthesia Post Note  Patient: [name]  Procedure(s) Performed: INSERTION OF TUNNELED  DIALYSIS CATHETER - RIGHT INTERNAL JUGULAR PLACEMENT (Right Chest) ARTERIOVENOUS (AV) FISTULA CREATION LEFT UPPER ARM (Left Arm Upper)     Patient location during evaluation: PACU Anesthesia Type: General Level of consciousness: awake and alert Pain management: pain level controlled Vital Signs Assessment: post-procedure vital signs reviewed and stable Respiratory status: spontaneous breathing, nonlabored ventilation, respiratory function stable and patient connected to nasal cannula oxygen Cardiovascular status: blood pressure returned to baseline and stable Postop Assessment: no apparent nausea or vomiting Anesthetic complications: no    Last Vitals:  Vitals:   09/05/17 1130 09/05/17 1152  BP: (!) 144/80 (!) 149/85  Pulse: 60 (!) 59  Resp: 18 18  Temp:  36.8 C  SpO2:  95%    Last Pain:  Vitals:   09/05/17 1430  TempSrc:   PainSc: 2                  Barnet [place]

## 2017-09-09 ENCOUNTER — Inpatient Hospital Stay (HOSPITAL_COMMUNITY): Admission: RE | Admit: 2017-09-09 | Payer: Medicaid Other | Source: Ambulatory Visit

## 2017-09-09 ENCOUNTER — Encounter (HOSPITAL_COMMUNITY): Payer: Medicaid Other

## 2017-09-11 ENCOUNTER — Encounter: Payer: Medicaid Other | Admitting: Vascular Surgery

## 2017-09-14 ENCOUNTER — Encounter: Payer: Self-pay | Admitting: Nurse Practitioner

## 2017-09-14 ENCOUNTER — Ambulatory Visit: Payer: Medicaid Other | Admitting: Nurse Practitioner

## 2017-09-14 NOTE — Progress Notes (Deleted)
Referring Provider: Deloria Lair., MD Primary Care Physician:  [facility] [place] Primary GI:  Dr. Oneida Alar  No chief complaint on file.   HPI:   Aaron [name] is a 62 y.o. male who presents for follow-up.  The patient was last seen in our office 05/25/2017 for duodenitis and upper abdominal pain.  True positive hepatitis C antibody but negative RNA.  Colonoscopy completed 2018 which found dense left-sided diverticulosis, right colon ulcer status post biopsy query cold NSAID versus transient ischemia and not consistent with IBD.  CMV stains were negative.  EGD at same time found mild Schatzki's ring status post dilation, small hiatal hernia, erosive gastropathy.  Previously on large doses of ibuprofen and BC powders daily.    He was admitted inpatient January 2019 for duodenitis.  EGD with patchy erythema of the gastric mucosa diffusely and extensive inflammation no changes beginning in the post bulb and extending through most of the second portion duodenum.  Geographic ulceration and mucosal edema was present.  This did encroach somewhat on the lumen however duodenal lumen remained widely patent.  Distal second portion of the duodenum from mucosa which appeared to be abnormal with 1 to 3 mm whitish submucosal lesions.  Third portion of the duodenum appeared normal.  Multiple biopsies taken and pathology found to be peptic duodenitis with ulceration.  CT which was reviewed with the radiologist from January 2019 found celiac axis possibly 50% stenosed, SMA open, IMA without plaque.  Needs mesenteric duplex to evaluate vasculature as he is not able to do CTA or MRA due to chronic kidney disease.  Fluctuating weight which peaked at 213 pounds in December 2018 and was down to 195 pounds at his last visit (2 months later).  At his last visit he noted he has no appetite, smell of food causes nausea and significant food aversion.  Pain noted to improve after eating, feels like  sometimes pain does not digest.  Milks use his stomach but he throws this up curdled.  Intermittent nausea and vomiting postprandial.  Protonix daily. Outside labs from Jan 2019: Hgb 11.4, Hct 34.6, BUN 36, Cr 5, GFR 12, Tbili 0.2, Alk phos 97, AST 10, ALT.recommended UGI with small bowel follow-through, discussed with vascular best way to evaluate vasculature.  Query best served with an appointment with vascular due to concern of vascular disease.  Increase PPI to twice daily.  UGI with small bowel follow-through on 05/29/2017 found duodenitis improving, no evidence of GOO.  Discussed with vasculature physician who recommended office visit and send a mesenteric duplex.  The patient was a no-show for vascular appointment and mesenteric duplex.  Noted on 08/14/2017 the patient underwent right iliofemoral endarterectomy with vascular surgery.  On 08/24/2017 he was admitted to the hospital at [facility] through 09/05/2017 for anasarca secondary to worsening CKD stage V, proximal A. fib, PAD, others.  Primary dialysis catheter was placed, and this was converted to a tunneled catheter on 09/04/2017.  Same time left first stage basilic vein fistula was created.  Today he states   Past Medical History:  Diagnosis Date  . Anxiety   . Arthritis    knees , back , shoulders  . CAD (coronary artery disease)    Mild nonobstructive disease at cardiac catheterization 2002  . Chronic back pain   . CKD (chronic kidney disease), stage II   . COPD (chronic obstructive pulmonary disease) ([place])   . Diverticulitis   . Esophageal reflux   . Essential  hypertension   . Hepatitis C    states he no longer has this  . History of kidney stones   . History of syncope   . Hyperlipidemia   . Nephrolithiasis   . PAT (paroxysmal atrial tachycardia) ([place])   . Peripheral arterial disease (HCC)    Occluded left superficial femoral artery status post stent June 2016 - Dr. Trula Slade  . Pneumonia    as child   26 years old  .  Polycystic kidney, unspecified type   . Syncope 09/2014    Past Surgical History:  Procedure Laterality Date  . ABDOMINAL AORTOGRAM N/A 08/11/2017   Procedure: ABDOMINAL AORTOGRAM;  Surgeon: Serafina Mitchell, MD;  Location: [place] [facility];  Service: Cardiovascular;  Laterality: N/A;  . APPLICATION OF WOUND VAC Right 08/14/2017   Procedure: APPLICATION OF PREVENA INCISIONAL WOUND VAC RIGHT GROIN;  Surgeon: Serafina Mitchell, MD;  Location: [facility];  Service: Vascular;  Laterality: Right;  . AV FISTULA PLACEMENT Left 09/04/2017   Procedure: ARTERIOVENOUS (AV) FISTULA CREATION LEFT UPPER ARM;  Surgeon: Serafina Mitchell, MD;  Location: [facility];  Service: Vascular;  Laterality: Left;  . BACK SURGERY    . BIOPSY  12/17/2016   Procedure: BIOPSY;  Surgeon: Daneil Dolin, MD;  [facility];  Service: Gastroenterology;;  gastric colon  . BIOPSY  04/29/2017   Procedure: BIOPSY;  Surgeon: Daneil Dolin, MD;  [facility];  Service: Endoscopy;;  duodenal biopsies  . BUNIONECTOMY    . CERVICAL SPINE SURGERY    . COLONOSCOPY  2008   Dr. Oneida Alar: rare sigmoid colon diverticulosis, internal hemorrhoids.   . COLONOSCOPY WITH PROPOFOL N/A 12/17/2016   dense left-sided diverticulosis, right colon ulcers s/p biopsy query occult NSAID use vs transient ischemia, not consistent with IBD. CMV stains negative.   Marland Kitchen ENDARTERECTOMY FEMORAL Right 08/14/2017   Procedure: RIGHT ILLIO-FEMORAL ENDARTERECTOMY;  Surgeon: Serafina Mitchell, MD;  [facility];  Service: Vascular;  Laterality: Right;  . ESOPHAGEAL DILATION  12/17/2016   EGD with mild Schatzki's ring s/p dilatation, small hiatal hernia, erosive gastropathy (negative H.pylori gastritis)  . ESOPHAGOGASTRODUODENOSCOPY  2008   Dr. Oneida Alar: normal esophagus without Barrett's, antritis and duodenitis, path with H.pylori gastritis  . ESOPHAGOGASTRODUODENOSCOPY (EGD) WITH PROPOFOL N/A 12/17/2016   Procedure: ESOPHAGOGASTRODUODENOSCOPY (EGD) WITH PROPOFOL;   Surgeon: Daneil Dolin, MD;  [facility];  Service: Gastroenterology;  Laterality: N/A;  . ESOPHAGOGASTRODUODENOSCOPY (EGD) WITH PROPOFOL N/A 04/29/2017   Patchy erythema of gastric mucosa diffusely, extensive inflammatory changes in duodenum, geographic ulceration and mucosal edema present, encroaching somewhat on the lumen yet still widely patent, distal second portion of duodenum appeared abnormal, path with peptic duodenitis with ulceration  . HERNIA REPAIR     umbilical  . INSERTION OF DIALYSIS CATHETER Right 09/04/2017   Procedure: INSERTION OF TUNNELED  DIALYSIS CATHETER - RIGHT INTERNAL JUGULAR PLACEMENT;  Surgeon: Serafina Mitchell, MD;  Location: [facility];  Service: Vascular;  Laterality: Right;  . INSERTION OF ILIAC STENT Right 08/14/2017   Procedure: INSERTION OF RIGHT COMMON ILIAC STENT 21m x 816mx 130cm INSERTION OF RIGHT EXTERNAL ILIAC STENT 49m26m 11m17m130cm INSERTION OF SUPERFICIAL FERMORAL ARTERY STENT 7mm 54m0mm 4m0cm;  Surgeon: BrabhaSerafina Mitchell Location: [facility]Service: Vascular;  Laterality: Right;  . IR FLUORO GUIDE CV LINE RIGHT  08/31/2017  . IR US [place] VASC ACCESS RIGHT  08/31/2017  . laparoscopic inguinal  hernia right  02/2017   [place]  . LOWER EXTREMITY ANGIOGRAPHY Right 08/11/2017   Procedure: LOWER EXTREMITY ANGIOGRAPHY;  Surgeon: Serafina Mitchell, MD;  Location: [facility];  Service: Cardiovascular;  Laterality: Right;  . PATCH ANGIOPLASTY Right 08/14/2017   Procedure: PATCH ANGIOPLASTY USING HEMASHIELD PATCH 0.3IN Lillie [place];  Surgeon: Serafina Mitchell, MD;  Location: [facility];  Service: Vascular;  Laterality: Right;  . PERIPHERAL VASCULAR CATHETERIZATION N/A 09/20/2014   Procedure: Abdominal Aortogram;  Surgeon: Serafina Mitchell, MD;  Location: [place] [facility];  Service: Cardiovascular;  Laterality: N/A;    Current Outpatient Medications  Medication Sig Dispense Refill  . BREO ELLIPTA 200-25 MCG/INH AEPB Inhale 1 puff into the lungs daily.    .  calcitRIOL (ROCALTROL) 0.25 MCG capsule Take 1 capsule (0.25 mcg total) by mouth Every Tuesday,Thursday,and Saturday with dialysis. 30 capsule 0  . carvedilol (COREG) 25 MG tablet Take 1 tablet (25 mg total) 2 (two) times daily by mouth. 180 tablet 3  . cholecalciferol (VITAMIN D) 1000 units tablet Take 2,000 Units by mouth daily.    . clopidogrel (PLAVIX) 75 MG tablet Take 1 tablet (75 mg total) by mouth daily. 30 tablet 3  . oxyCODONE-acetaminophen (PERCOCET/ROXICET) 5-325 MG tablet Take 1 tablet by mouth every 6 (six) hours as needed. (Patient taking differently: Take 1 tablet by mouth every 3 (three) hours as needed for moderate pain. ) 20 tablet 0  . pantoprazole (PROTONIX) 40 MG tablet Take 1 tablet (40 mg total) by mouth 2 (two) times daily. 60 tablet 1  . warfarin (COUMADIN) 5 MG tablet Take 1.5 tablets (7.5 mg total) by mouth daily at 6 PM. 30 tablet 0   No current facility-administered medications for this visit.     Allergies as of 09/14/2017  . (No Known Allergies)    Family History  Problem Relation Age of Onset  . Alcoholism Mother   . Heart disease Father        Massive heart attack  . Heart attack Father   . Atrial fibrillation Father   . Colon cancer Father   . Colon cancer Maternal Grandfather 15  . Alcoholism Maternal Grandfather   . Renal cancer Cousin   . Ovarian cancer Sister     Social History   Socioeconomic History  . Marital status: Legally Separated    Spouse name: Not on file  . Number of children: 2  . Years of education: Not on file  . Highest education level: Not on file  Occupational History  . Occupation: DISABLED  Social Needs  . Financial resource strain: Not on file  . Food insecurity:    Worry: Not on file    Inability: Not on file  . Transportation needs:    Medical: Not on file    Non-medical: Not on file  Tobacco Use  . Smoking status: Former Smoker    Packs/day: 0.50    Years: 47.00    Pack years: 23.50    Types: Cigarettes      Last attempt to quit: 08/03/2017    Years since quitting: 0.1  . Smokeless tobacco: Former Systems developer    Types: Snuff, Chew  Substance and Sexual Activity  . Alcohol use: No    Alcohol/week: 0.0 oz    Comment: alcohol free 2017,  heavy drinker in the past  . Drug use: Not Currently    Comment: states in remote past he used "whatever was around"  . Sexual activity: Not on  file  Lifestyle  . Physical activity:    Days per week: Not on file    Minutes per session: Not on file  . Stress: Not on file  Relationships  . Social connections:    Talks on phone: Not on file    Gets together: Not on file    Attends religious service: Not on file    Active member of club or organization: Not on file    Attends meetings of clubs or organizations: Not on file    Relationship status: Not on file  Other Topics Concern  . Not on file  Social History Narrative   Disabled from Back since 2007    Review of Systems: General: Negative for anorexia, weight loss, fever, chills, fatigue, weakness. Eyes: Negative for vision changes.  ENT: Negative for hoarseness, difficulty swallowing , nasal congestion. CV: Negative for chest pain, angina, palpitations, dyspnea on exertion, peripheral edema.  Respiratory: Negative for dyspnea at rest, dyspnea on exertion, cough, sputum, wheezing.  GI: See history of present illness. GU:  Negative for dysuria, hematuria, urinary incontinence, urinary frequency, nocturnal urination.  MS: Negative for joint pain, low back pain.  Derm: Negative for rash or itching.  Neuro: Negative for weakness, abnormal sensation, seizure, frequent headaches, memory loss, confusion.  Psych: Negative for anxiety, depression, suicidal ideation, hallucinations.  Endo: Negative for unusual weight change.  Heme: Negative for bruising or bleeding. Allergy: Negative for rash or hives.   Physical Exam: There were no vitals taken for this visit. General:   Alert and oriented. Pleasant and  cooperative. Well-nourished and well-developed.  Head:  Normocephalic and atraumatic. Eyes:  Without icterus, sclera clear and conjunctiva pink.  Ears:  Normal auditory acuity. Mouth:  No deformity or lesions, oral mucosa pink.  Throat/Neck:  Supple, without mass or thyromegaly. Cardiovascular:  S1, S2 present without murmurs appreciated. Normal pulses noted. Extremities without clubbing or edema. Respiratory:  Clear to auscultation bilaterally. No wheezes, rales, or rhonchi. No distress.  Gastrointestinal:  +BS, soft, non-tender and non-distended. No HSM noted. No guarding or rebound. No masses appreciated.  Rectal:  Deferred  Musculoskalatal:  Symmetrical without gross deformities. Normal posture. Skin:  Intact without significant lesions or rashes. Neurologic:  Alert and oriented x4;  grossly normal neurologically. Psych:  Alert and cooperative. Normal mood and affect. Heme/Lymph/Immune: No significant cervical adenopathy. No excessive bruising noted.    09/14/2017 8:32 AM   Disclaimer: This note was dictated with voice recognition software. Similar sounding words can inadvertently be transcribed and may not be corrected upon review.

## 2017-09-18 ENCOUNTER — Encounter (HOSPITAL_COMMUNITY): Payer: Self-pay | Admitting: Emergency Medicine

## 2017-09-18 ENCOUNTER — Inpatient Hospital Stay (HOSPITAL_COMMUNITY)
Admission: EM | Admit: 2017-09-18 | Discharge: 2017-09-23 | DRG: 919 | Disposition: A | Discharge status: Home Health | Payer: 59 | Attending: Vascular Surgery | Admitting: Vascular Surgery

## 2017-09-18 ENCOUNTER — Telehealth: Payer: Self-pay

## 2017-09-18 ENCOUNTER — Other Ambulatory Visit: Payer: Self-pay

## 2017-09-18 DIAGNOSIS — I2510 Atherosclerotic heart disease of native coronary artery without angina pectoris: Secondary | ICD-10-CM | POA: Diagnosis present

## 2017-09-18 DIAGNOSIS — Z992 Dependence on renal dialysis: Secondary | ICD-10-CM

## 2017-09-18 DIAGNOSIS — Z87442 Personal history of urinary calculi: Secondary | ICD-10-CM

## 2017-09-18 DIAGNOSIS — I898 Other specified noninfective disorders of lymphatic vessels and lymph nodes: Secondary | ICD-10-CM | POA: Diagnosis present

## 2017-09-18 DIAGNOSIS — F419 Anxiety disorder, unspecified: Secondary | ICD-10-CM | POA: Diagnosis present

## 2017-09-18 DIAGNOSIS — T8189XA Other complications of procedures, not elsewhere classified, initial encounter: Principal | ICD-10-CM | POA: Diagnosis present

## 2017-09-18 DIAGNOSIS — I120 Hypertensive chronic kidney disease with stage 5 chronic kidney disease or end stage renal disease: Secondary | ICD-10-CM | POA: Diagnosis present

## 2017-09-18 DIAGNOSIS — I255 Ischemic cardiomyopathy: Secondary | ICD-10-CM | POA: Diagnosis present

## 2017-09-18 DIAGNOSIS — I252 Old myocardial infarction: Secondary | ICD-10-CM

## 2017-09-18 DIAGNOSIS — Q613 Polycystic kidney, unspecified: Secondary | ICD-10-CM

## 2017-09-18 DIAGNOSIS — Z7902 Long term (current) use of antithrombotics/antiplatelets: Secondary | ICD-10-CM

## 2017-09-18 DIAGNOSIS — Y838 Other surgical procedures as the cause of abnormal reaction of the patient, or of later complication, without mention of misadventure at the time of the procedure: Secondary | ICD-10-CM | POA: Diagnosis present

## 2017-09-18 DIAGNOSIS — E785 Hyperlipidemia, unspecified: Secondary | ICD-10-CM | POA: Diagnosis present

## 2017-09-18 DIAGNOSIS — J449 Chronic obstructive pulmonary disease, unspecified: Secondary | ICD-10-CM | POA: Diagnosis present

## 2017-09-18 DIAGNOSIS — T792XXA Traumatic secondary and recurrent hemorrhage and seroma, initial encounter: Secondary | ICD-10-CM | POA: Diagnosis present

## 2017-09-18 DIAGNOSIS — R1909 Other intra-abdominal and pelvic swelling, mass and lump: Secondary | ICD-10-CM

## 2017-09-18 DIAGNOSIS — Z79899 Other long term (current) drug therapy: Secondary | ICD-10-CM

## 2017-09-18 DIAGNOSIS — K219 Gastro-esophageal reflux disease without esophagitis: Secondary | ICD-10-CM | POA: Diagnosis present

## 2017-09-18 DIAGNOSIS — Z95820 Peripheral vascular angioplasty status with implants and grafts: Secondary | ICD-10-CM

## 2017-09-18 DIAGNOSIS — N2581 Secondary hyperparathyroidism of renal origin: Secondary | ICD-10-CM | POA: Diagnosis present

## 2017-09-18 DIAGNOSIS — Z87891 Personal history of nicotine dependence: Secondary | ICD-10-CM

## 2017-09-18 DIAGNOSIS — I70202 Unspecified atherosclerosis of native arteries of extremities, left leg: Secondary | ICD-10-CM | POA: Diagnosis present

## 2017-09-18 DIAGNOSIS — E8889 Other specified metabolic disorders: Secondary | ICD-10-CM | POA: Diagnosis present

## 2017-09-18 DIAGNOSIS — I724 Aneurysm of artery of lower extremity: Secondary | ICD-10-CM

## 2017-09-18 DIAGNOSIS — G8929 Other chronic pain: Secondary | ICD-10-CM | POA: Diagnosis present

## 2017-09-18 DIAGNOSIS — I480 Paroxysmal atrial fibrillation: Secondary | ICD-10-CM | POA: Diagnosis present

## 2017-09-18 DIAGNOSIS — D649 Anemia, unspecified: Secondary | ICD-10-CM | POA: Diagnosis present

## 2017-09-18 DIAGNOSIS — N186 End stage renal disease: Secondary | ICD-10-CM | POA: Diagnosis present

## 2017-09-18 DIAGNOSIS — Z7901 Long term (current) use of anticoagulants: Secondary | ICD-10-CM

## 2017-09-18 DIAGNOSIS — N472 Paraphimosis: Secondary | ICD-10-CM | POA: Diagnosis not present

## 2017-09-18 DIAGNOSIS — Z8249 Family history of ischemic heart disease and other diseases of the circulatory system: Secondary | ICD-10-CM

## 2017-09-18 LAB — CBC WITH DIFFERENTIAL/PLATELET
BASOS PCT: 1 %
Basophils Absolute: 0.1 10*3/uL (ref 0.0–0.1)
Eosinophils Absolute: 0.2 10*3/uL (ref 0.0–0.7)
Eosinophils Relative: 2 %
HEMATOCRIT: 36.2 % — AB (ref 39.0–52.0)
HEMOGLOBIN: 11.6 g/dL — AB (ref 13.0–17.0)
Lymphocytes Relative: 15 %
Lymphs Abs: 1 10*3/uL (ref 0.7–4.0)
MCH: 32.8 pg (ref 26.0–34.0)
MCHC: 32 g/dL (ref 30.0–36.0)
MCV: 102.3 fL — ABNORMAL HIGH (ref 78.0–100.0)
MONOS PCT: 10 %
Monocytes Absolute: 0.7 10*3/uL (ref 0.1–1.0)
NEUTROS PCT: 72 %
Neutro Abs: 4.8 10*3/uL (ref 1.7–7.7)
Platelets: 229 10*3/uL (ref 150–400)
RBC: 3.54 MIL/uL — ABNORMAL LOW (ref 4.22–5.81)
RDW: 14.9 % (ref 11.5–15.5)
WBC: 6.7 10*3/uL (ref 4.0–10.5)

## 2017-09-18 LAB — TYPE AND SCREEN
ABO/RH(D): O POS
ANTIBODY SCREEN: NEGATIVE

## 2017-09-18 LAB — BASIC METABOLIC PANEL
Anion gap: 14 (ref 5–15)
BUN: 26 mg/dL — ABNORMAL HIGH (ref 6–20)
CHLORIDE: 92 mmol/L — AB (ref 101–111)
CO2: 29 mmol/L (ref 22–32)
CREATININE: 4.42 mg/dL — AB (ref 0.61–1.24)
Calcium: 8.4 mg/dL — ABNORMAL LOW (ref 8.9–10.3)
GFR calc non Af Amer: 13 mL/min — ABNORMAL LOW (ref >60)
GFR, EST AFRICAN AMERICAN: 15 mL/min — AB (ref >60)
Glucose, Bld: 100 mg/dL — ABNORMAL HIGH (ref 65–99)
Potassium: 3.1 mmol/L — ABNORMAL LOW (ref 3.5–5.1)
Sodium: 135 mmol/L (ref 135–145)

## 2017-09-18 LAB — PROTIME-INR
INR: 1.74
Prothrombin Time: 20.2 seconds — ABNORMAL HIGH (ref 11.4–15.2)

## 2017-09-18 MED ORDER — FENTANYL CITRATE (PF) 100 MCG/2ML IJ SOLN
50.0000 ug | Freq: Once | INTRAMUSCULAR | Status: AC
  Administered 2017-09-18: 50 ug via INTRAVENOUS
  Filled 2017-09-18: qty 2

## 2017-09-18 NOTE — ED Triage Notes (Signed)
Patient states he had surgery on a blockage and was cut in his right groin area on 08/14/17 and has a "knot" to incision site 2 days ago. States Dr Hulan Amato office advised to come to ER for check. Patient complains of pain to area.

## 2017-09-18 NOTE — ED Provider Notes (Signed)
[name] EMERGENCY DEPARTMENT Provider Note   CSN: [id number] Arrival date & time: 09/18/17  1802     History   Chief Complaint Chief Complaint  Patient presents with  . Wound Check    HPI GRAY DOERING is a 62 y.o. male.  HPI  62 year old male comes in with chief complaint of swelling in his right groin.  Patient has history of CAD, ESRD on hemodialysis, COPD, PAD status post stenting and endarterectomy of his right lower extremity on 08-14-2017.  Patient states that about 2 days ago he noticed a large knot at the site of incision.  Over time the swelling has gotten worse.  Patient denies any nausea, vomiting, fevers, chills.  There is no pain and no drainage from the site.  Patient is taking Coumadin.  Past Medical History:  Diagnosis Date  . Anxiety   . Arthritis    knees , back , shoulders  . CAD (coronary artery disease)    Mild nonobstructive disease at cardiac catheterization 2002  . Chronic back pain   . CKD (chronic kidney disease), stage II   . COPD (chronic obstructive pulmonary disease) ([place])   . Diverticulitis   . Esophageal reflux   . Essential hypertension   . Hepatitis C    states he no longer has this  . History of kidney stones   . History of syncope   . Hyperlipidemia   . Nephrolithiasis   . PAT (paroxysmal atrial tachycardia) ([place])   . Peripheral arterial disease (HCC)    Occluded left superficial femoral artery status post stent June 2016 - Dr. Trula Slade  . Pneumonia    as child   73 years old  . Polycystic kidney, unspecified type   . Syncope 09/2014    Patient Active Problem List   Diagnosis Date Noted  . Volume overload   . Groin swelling 08/24/2017  . Hyperlipidemia 08/24/2017  . Duodenitis 04/27/2017  . Schatzki's ring of distal esophagus 04/27/2017  . Encounter for therapeutic drug monitoring 03/03/2017  . Abdominal pain 02/20/2017  . History of hepatitis C 02/20/2017  . Ischemic cardiomyopathy 01/27/2017  . Dyslipidemia  01/27/2017  . Paroxysmal atrial fibrillation ([place]) 01/27/2017  . Preoperative clearance 01/21/2017  . CAD (coronary artery disease) 01/21/2017  . Pain of upper abdomen   . Acute renal failure (ARF) ([place]) 12/16/2016  . Rectal bleeding   . AKI (acute kidney injury) ([place]) 12/15/2016  . CKD (chronic kidney disease), stage V ([place]) 12/15/2016  . Hepatitis C 12/15/2016  . Polycystic kidney disease 12/14/2016  . Jerking 09/23/2014  . Syncope 09/23/2014  . COPD (chronic obstructive pulmonary disease) ([place])   . COLD (chronic obstructive lung disease) ([place])   . Depression   . PAD (peripheral artery disease) ([place]) 09/19/2014  . Preop cardiovascular exam 10/28/2010  . Tobacco abuse 10/28/2010  . SYNCOPE 05/07/2010  . UNSPECIFIED IRON DEFICIENCY ANEMIA 10/04/2009  . 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  . Essential hypertension 10/04/2009  . Esophageal reflux 10/04/2009  . PRECORDIAL PAIN 10/04/2009    Past Surgical History:  Procedure Laterality Date  . ABDOMINAL AORTOGRAM N/A 08/11/2017   Procedure: ABDOMINAL AORTOGRAM;  Surgeon: Serafina Mitchell, MD;  Location: [place] [facility];  Service: Cardiovascular;  Laterality: N/A;  . APPLICATION OF WOUND VAC Right 08/14/2017   Procedure: APPLICATION OF PREVENA INCISIONAL WOUND VAC RIGHT GROIN;  Surgeon: Serafina Mitchell, MD;  Location: [facility] OR;  Service: Vascular;  Laterality: Right;  . AV FISTULA PLACEMENT Left 09/04/2017  Procedure: ARTERIOVENOUS (AV) FISTULA CREATION LEFT UPPER ARM;  Surgeon: Serafina Mitchell, MD;  Location: [place];  Service: Vascular;  Laterality: Left;  . BACK SURGERY    . BIOPSY  12/17/2016   Procedure: BIOPSY;  Surgeon: Daneil Dolin, MD;  Location: [facility];  Service: Gastroenterology;;  gastric colon  . BIOPSY  04/29/2017   Procedure: BIOPSY;  Surgeon: Daneil Dolin, MD;  [facility];  Service: Endoscopy;;  duodenal biopsies  . BUNIONECTOMY    . CERVICAL SPINE SURGERY    . COLONOSCOPY  2008   Dr. Oneida Alar: rare  sigmoid colon diverticulosis, internal hemorrhoids.   . COLONOSCOPY WITH PROPOFOL N/A 12/17/2016   dense left-sided diverticulosis, right colon ulcers s/p biopsy query occult NSAID use vs transient ischemia, not consistent with IBD. CMV stains negative.   Marland Kitchen ENDARTERECTOMY FEMORAL Right 08/14/2017   Procedure: RIGHT ILLIO-FEMORAL ENDARTERECTOMY;  Surgeon: Serafina Mitchell, MD;  Location[facility] OR;  Service: Vascular;  Laterality: Right;  . ESOPHAGEAL DILATION  12/17/2016   EGD with mild Schatzki's ring s/p dilatation, small hiatal hernia, erosive gastropathy (negative H.pylori gastritis)  . ESOPHAGOGASTRODUODENOSCOPY  2008   Dr. Oneida Alar: normal esophagus without Barrett's, antritis and duodenitis, path with H.pylori gastritis  . ESOPHAGOGASTRODUODENOSCOPY (EGD) WITH PROPOFOL N/A 12/17/2016   Procedure: ESOPHAGOGASTRODUODENOSCOPY (EGD) WITH PROPOFOL;  Surgeon: Daneil Dolin, MD;  [facility];  Service: Gastroenterology;  Laterality: N/A;  . ESOPHAGOGASTRODUODENOSCOPY (EGD) WITH PROPOFOL N/A 04/29/2017   Patchy erythema of gastric mucosa diffusely, extensive inflammatory changes in duodenum, geographic ulceration and mucosal edema present, encroaching somewhat on the lumen yet still widely patent, distal second portion of duodenum appeared abnormal, path with peptic duodenitis with ulceration  . HERNIA REPAIR     umbilical  . INSERTION OF DIALYSIS CATHETER Right 09/04/2017   Procedure: INSERTION OF TUNNELED  DIALYSIS CATHETER - RIGHT INTERNAL JUGULAR PLACEMENT;  Surgeon: Serafina Mitchell, MD;  Location: [place];  Service: Vascular;  Laterality: Right;  . INSERTION OF ILIAC STENT Right 08/14/2017   Procedure: INSERTION OF RIGHT COMMON ILIAC STENT 22mm x 35mm x 130cm INSERTION OF RIGHT EXTERNAL ILIAC STENT 30mm x 87mm x 130cm INSERTION OF SUPERFICIAL FERMORAL ARTERY STENT 18mm x 3mm x 130cm;  Surgeon: Serafina Mitchell, MD;  Location: [facility] OR;  Service: Vascular;  Laterality: Right;  . IR FLUORO GUIDE CV LINE  RIGHT  08/31/2017  . IR US GUIDE VASC ACCESS RIGHT  08/31/2017  . laparoscopic inguinal hernia right  02/2017   [place]  . LOWER EXTREMITY ANGIOGRAPHY Right 08/11/2017   Procedure: LOWER EXTREMITY ANGIOGRAPHY;  Surgeon: Serafina Mitchell, MD;  Location: [place] [facility];  Service: Cardiovascular;  Laterality: Right;  . PATCH ANGIOPLASTY Right 08/14/2017   Procedure: PATCH ANGIOPLASTY USING HEMASHIELD PATCH 0.3IN Lillie [place];  Surgeon: Serafina Mitchell, MD;  Location: [facility] OR;  Service: Vascular;  Laterality: Right;  . PERIPHERAL VASCULAR CATHETERIZATION N/A 09/20/2014   Procedure: Abdominal Aortogram;  Surgeon: Serafina Mitchell, MD;  Location: [place] [facility];  Service: Cardiovascular;  Laterality: N/A;        Home Medications    Prior to Admission medications   Medication Sig Start Date End Date Taking? Authorizing Provider  BREO ELLIPTA 200-25 MCG/INH AEPB Inhale 1 puff into the lungs daily. 07/27/17   [provider]  calcitRIOL (ROCALTROL) 0.25 MCG capsule Take 1 capsule (0.25 mcg total) by mouth Every Tuesday,Thursday,and Saturday with dialysis. 09/08/17   Ghimire, Henreitta Leber, MD  carvedilol (COREG) 25 MG tablet Take 1 tablet (25 mg total) 2 (two) times daily by mouth. 02/25/17 08/24/17  Imogene Burn, PA-C  cholecalciferol (VITAMIN D) 1000 units tablet Take 2,000 Units by mouth daily.    [provider]  clopidogrel (PLAVIX) 75 MG tablet Take 1 tablet (75 mg total) by mouth daily. 08/16/17   Ulyses Amor, PA-C  oxyCODONE-acetaminophen (PERCOCET/ROXICET) 5-325 MG tablet Take 1 tablet by mouth every 6 (six) hours as needed. Patient taking differently: Take 1 tablet by mouth every 3 (three) hours as needed for moderate pain.  08/16/17   Ulyses Amor, PA-C  pantoprazole (PROTONIX) 40 MG tablet Take 1 tablet (40 mg total) by mouth 2 (two) times daily. 04/30/17 08/14/17  Manuella Ghazi, Pratik D, DO  warfarin (COUMADIN) 5 MG tablet Take 1.5 tablets (7.5 mg total) by mouth daily at 6 PM. 09/05/17    Ghimire, Henreitta Leber, MD    Family History Family History  Problem Relation Age of Onset  . Alcoholism Mother   . Heart disease Father        Massive heart attack  . Heart attack Father   . Atrial fibrillation Father   . Colon cancer Father   . Colon cancer Maternal Grandfather 39  . Alcoholism Maternal Grandfather   . Renal cancer Cousin   . Ovarian cancer Sister     Social History Social History   Tobacco Use  . Smoking status: Former Smoker    Packs/day: 0.50    Years: 47.00    Pack years: 23.50    Types: Cigarettes    Last attempt to quit: 08/03/2017    Years since quitting: 0.1  . Smokeless tobacco: Former Systems developer    Types: Snuff, Chew  Substance Use Topics  . Alcohol use: No    Alcohol/week: 0.0 oz    Comment: alcohol free 2017,  heavy drinker in the past  . Drug use: Not Currently    Comment: states in remote past he used "whatever was around"     Allergies   Patient has no known allergies.   Review of Systems Review of Systems  Constitutional: Positive for activity change.  Cardiovascular: Negative for chest pain.  Gastrointestinal: Negative for abdominal pain.  Skin: Positive for color change.  Hematological: Bruises/bleeds easily.  All other systems reviewed and are negative.    Physical Exam Updated Vital Signs BP (!) 190/102 (BP Location: Right Arm)   Pulse 69   Temp 98.4 F (36.9 C) (Oral)   Resp 16   Ht 6\' 4"  (1.93 m)   Wt 94.3 kg (208 lb)   SpO2 98%   BMI 25.32 kg/m   Physical Exam  Constitutional: He is oriented to person, place, and time. He appears well-developed.  HENT:  Head: Atraumatic.  Neck: Neck supple.  Cardiovascular: Normal rate.  Pulmonary/Chest: Effort normal.  Abdominal: Soft. There is no tenderness.  Musculoskeletal:  Right groin has a large area of edema, which is fluctuant -approximately size of a golf ball.  Neurological: He is alert and oriented to person, place, and time.  Skin: Skin is warm.  Nursing note  and vitals reviewed.         ED Treatments / Results  Labs (all labs ordered are listed, but only abnormal results are displayed) Labs Reviewed  CBC WITH DIFFERENTIAL/PLATELET - Abnormal; Notable for the following components:      Result Value   RBC 3.54 (*)    Hemoglobin 11.6 (*)  HCT 36.2 (*)    MCV 102.3 (*)    All other components within normal limits  BASIC METABOLIC PANEL - Abnormal; Notable for the following components:   Potassium 3.1 (*)    Chloride 92 (*)    Glucose, Bld 100 (*)    BUN 26 (*)    Creatinine, Ser 4.42 (*)    Calcium 8.4 (*)    GFR calc non Af Amer 13 (*)    GFR calc Af Amer 15 (*)    All other components within normal limits  PROTIME-INR - Abnormal; Notable for the following components:   Prothrombin Time 20.2 (*)    All other components within normal limits  TYPE AND SCREEN    EKG None  Radiology No results found.  Procedures Procedures (including critical care time)  ULTRASOUND LIMITED SOFT TISSUE/ MUSCULOSKELETAL: right groin. Indication: sudden, new onset edema Linear probe used to evaluate area of interest in two planes. Findings:  Large anechoic cavity, just distal to the femoral vasculature. No color changes. Performed by: Dr Kathrynn Humble Images saved electronically   Medications Ordered in ED Medications  fentaNYL (SUBLIMAZE) injection 50 mcg (50 mcg Intravenous Given 09/18/17 2311)     Initial Impression / Assessment and Plan / ED Course  I have reviewed the triage vital signs and the nursing notes.  Pertinent labs & imaging results that were available during my care of the patient were reviewed by me and considered in my medical decision making (see chart for details).     62 year old male with recent right femoral endarterectomy with multiple stents placed due to his peripheral arterial disease comes in with chief complaint of right groin swelling.  Based on my exam, differential diagnosis includes pseudoaneurysm,  expanding hematoma, abscess.  Ultrasound does show that there is femoral vasculature just proximal to the large anechoic cavity.  I spoke with Dr. Oneida Alar, Vascular Surgery - and he would appreciate the patient to be transferred to [facility] emergency room.  Dr. Oneida Alar does not recommend any imaging here.  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

## 2017-09-18 NOTE — ED Notes (Signed)
Pt states a blood clot was removed recently from right groin area. This morning he abscess was the size of a marble, now has grown to size of baseball under the skin. Pt states it is painful to the touch. Pt had dialysis treatment yesterday and states his next appointment is tomorrow.

## 2017-09-18 NOTE — ED Notes (Signed)
Report given to [facility] at this time.  

## 2017-09-18 NOTE — Telephone Encounter (Signed)
[name] called in to report a golf ball sized knot in his R groin close to his stomach. Pt reported that the knot was the size of a marble on yesterday but is now the size of a golf ball. Also c/o R lower abdominal soreness that is progressively getting worse. Denied redness, fever and chills. Pain scale - 6 with slight swelling in the R abdomen. Pt stated he wanted to come in to see the Dr. Trula Slade on Monday, offered pt an appointment for today but he declined stating "I have to go get my granddaughter"  Advised the pt that he would need to go to the ED to be evaluated as soon as possible. Pt stated that he did not want to go to the ED stated "they may cut me and I don't want anymore surgery". Again advised pt to go to the ED as soon as possible.  At the pt's request an appointment was scheduled with our NP on Monday. He also stated he was going to go to the ED later on today.

## 2017-09-18 NOTE — ED Notes (Signed)
[facility] contacted regards to transport to [place]. Awaiting call back for ETA of available truck.

## 2017-09-19 ENCOUNTER — Other Ambulatory Visit: Payer: Self-pay

## 2017-09-19 DIAGNOSIS — I480 Paroxysmal atrial fibrillation: Secondary | ICD-10-CM | POA: Diagnosis present

## 2017-09-19 DIAGNOSIS — Y838 Other surgical procedures as the cause of abnormal reaction of the patient, or of later complication, without mention of misadventure at the time of the procedure: Secondary | ICD-10-CM | POA: Diagnosis present

## 2017-09-19 DIAGNOSIS — Z95820 Peripheral vascular angioplasty status with implants and grafts: Secondary | ICD-10-CM | POA: Diagnosis not present

## 2017-09-19 DIAGNOSIS — N2581 Secondary hyperparathyroidism of renal origin: Secondary | ICD-10-CM | POA: Diagnosis present

## 2017-09-19 DIAGNOSIS — E8889 Other specified metabolic disorders: Secondary | ICD-10-CM | POA: Diagnosis present

## 2017-09-19 DIAGNOSIS — I70202 Unspecified atherosclerosis of native arteries of extremities, left leg: Secondary | ICD-10-CM | POA: Diagnosis present

## 2017-09-19 DIAGNOSIS — G8929 Other chronic pain: Secondary | ICD-10-CM | POA: Diagnosis present

## 2017-09-19 DIAGNOSIS — I252 Old myocardial infarction: Secondary | ICD-10-CM | POA: Diagnosis not present

## 2017-09-19 DIAGNOSIS — Z992 Dependence on renal dialysis: Secondary | ICD-10-CM | POA: Diagnosis not present

## 2017-09-19 DIAGNOSIS — D649 Anemia, unspecified: Secondary | ICD-10-CM | POA: Diagnosis present

## 2017-09-19 DIAGNOSIS — I255 Ischemic cardiomyopathy: Secondary | ICD-10-CM | POA: Diagnosis present

## 2017-09-19 DIAGNOSIS — I2510 Atherosclerotic heart disease of native coronary artery without angina pectoris: Secondary | ICD-10-CM | POA: Diagnosis present

## 2017-09-19 DIAGNOSIS — T792XXA Traumatic secondary and recurrent hemorrhage and seroma, initial encounter: Secondary | ICD-10-CM | POA: Diagnosis present

## 2017-09-19 DIAGNOSIS — I120 Hypertensive chronic kidney disease with stage 5 chronic kidney disease or end stage renal disease: Secondary | ICD-10-CM | POA: Diagnosis present

## 2017-09-19 DIAGNOSIS — Q613 Polycystic kidney, unspecified: Secondary | ICD-10-CM | POA: Diagnosis not present

## 2017-09-19 DIAGNOSIS — N472 Paraphimosis: Secondary | ICD-10-CM | POA: Diagnosis not present

## 2017-09-19 DIAGNOSIS — I898 Other specified noninfective disorders of lymphatic vessels and lymph nodes: Secondary | ICD-10-CM | POA: Diagnosis present

## 2017-09-19 DIAGNOSIS — J449 Chronic obstructive pulmonary disease, unspecified: Secondary | ICD-10-CM | POA: Diagnosis present

## 2017-09-19 DIAGNOSIS — E785 Hyperlipidemia, unspecified: Secondary | ICD-10-CM | POA: Diagnosis present

## 2017-09-19 DIAGNOSIS — F419 Anxiety disorder, unspecified: Secondary | ICD-10-CM | POA: Diagnosis present

## 2017-09-19 DIAGNOSIS — T8189XA Other complications of procedures, not elsewhere classified, initial encounter: Secondary | ICD-10-CM | POA: Diagnosis present

## 2017-09-19 DIAGNOSIS — Z7901 Long term (current) use of anticoagulants: Secondary | ICD-10-CM | POA: Diagnosis not present

## 2017-09-19 DIAGNOSIS — N186 End stage renal disease: Secondary | ICD-10-CM | POA: Diagnosis present

## 2017-09-19 DIAGNOSIS — Z87442 Personal history of urinary calculi: Secondary | ICD-10-CM | POA: Diagnosis not present

## 2017-09-19 DIAGNOSIS — K219 Gastro-esophageal reflux disease without esophagitis: Secondary | ICD-10-CM | POA: Diagnosis present

## 2017-09-19 DIAGNOSIS — Z7902 Long term (current) use of antithrombotics/antiplatelets: Secondary | ICD-10-CM | POA: Diagnosis not present

## 2017-09-19 DIAGNOSIS — I9789 Other postprocedural complications and disorders of the circulatory system, not elsewhere classified: Secondary | ICD-10-CM | POA: Diagnosis not present

## 2017-09-19 LAB — URINALYSIS, ROUTINE W REFLEX MICROSCOPIC
BACTERIA UA: NONE SEEN
Bilirubin Urine: NEGATIVE
Glucose, UA: NEGATIVE mg/dL
KETONES UR: NEGATIVE mg/dL
LEUKOCYTES UA: NEGATIVE
Nitrite: NEGATIVE
PROTEIN: 30 mg/dL — AB
Specific Gravity, Urine: 1.008 (ref 1.005–1.030)
pH: 6 (ref 5.0–8.0)

## 2017-09-19 LAB — BASIC METABOLIC PANEL
ANION GAP: 11 (ref 5–15)
BUN: 26 mg/dL — ABNORMAL HIGH (ref 6–20)
CALCIUM: 8 mg/dL — AB (ref 8.9–10.3)
CO2: 29 mmol/L (ref 22–32)
Chloride: 96 mmol/L — ABNORMAL LOW (ref 101–111)
Creatinine, Ser: 4.66 mg/dL — ABNORMAL HIGH (ref 0.61–1.24)
GFR, EST AFRICAN AMERICAN: 14 mL/min — AB (ref >60)
GFR, EST NON AFRICAN AMERICAN: 12 mL/min — AB (ref >60)
Glucose, Bld: 100 mg/dL — ABNORMAL HIGH (ref 65–99)
POTASSIUM: 3 mmol/L — AB (ref 3.5–5.1)
SODIUM: 136 mmol/L (ref 135–145)

## 2017-09-19 LAB — CBC
HEMATOCRIT: 33.3 % — AB (ref 39.0–52.0)
Hemoglobin: 10.8 g/dL — ABNORMAL LOW (ref 13.0–17.0)
MCH: 32.6 pg (ref 26.0–34.0)
MCHC: 32.4 g/dL (ref 30.0–36.0)
MCV: 100.6 fL — AB (ref 78.0–100.0)
PLATELETS: 199 10*3/uL (ref 150–400)
RBC: 3.31 MIL/uL — AB (ref 4.22–5.81)
RDW: 14.5 % (ref 11.5–15.5)
WBC: 5.3 10*3/uL (ref 4.0–10.5)

## 2017-09-19 LAB — HEPARIN LEVEL (UNFRACTIONATED)
HEPARIN UNFRACTIONATED: 0.23 [IU]/mL — AB (ref 0.30–0.70)
Heparin Unfractionated: 0.45 IU/mL (ref 0.30–0.70)

## 2017-09-19 LAB — MRSA PCR SCREENING: MRSA BY PCR: NEGATIVE

## 2017-09-19 LAB — PROTIME-INR
INR: 1.83
PROTHROMBIN TIME: 21 s — AB (ref 11.4–15.2)

## 2017-09-19 MED ORDER — DOCUSATE SODIUM 100 MG PO CAPS
100.0000 mg | ORAL_CAPSULE | Freq: Two times a day (BID) | ORAL | Status: DC
  Administered 2017-09-19 – 2017-09-23 (×9): 100 mg via ORAL
  Filled 2017-09-19 (×9): qty 1

## 2017-09-19 MED ORDER — CALCIUM ACETATE (PHOS BINDER) 667 MG PO CAPS
667.0000 mg | ORAL_CAPSULE | Freq: Three times a day (TID) | ORAL | Status: DC
  Administered 2017-09-19 – 2017-09-23 (×8): 667 mg via ORAL
  Filled 2017-09-19 (×9): qty 1

## 2017-09-19 MED ORDER — SODIUM CHLORIDE 0.9 % IV SOLN
250.0000 mL | INTRAVENOUS | Status: DC | PRN
  Administered 2017-09-21: 250 mL via INTRAVENOUS
  Administered 2017-09-21: 11:00:00 via INTRAVENOUS

## 2017-09-19 MED ORDER — VITAMIN D 1000 UNITS PO TABS
2000.0000 [IU] | ORAL_TABLET | Freq: Every day | ORAL | Status: DC
  Administered 2017-09-19 – 2017-09-23 (×5): 2000 [IU] via ORAL
  Filled 2017-09-19 (×5): qty 2

## 2017-09-19 MED ORDER — SODIUM CHLORIDE 0.9% FLUSH: 3.0000 mL | INTRAVENOUS | Status: DC | PRN

## 2017-09-19 MED ORDER — LABETALOL HCL 5 MG/ML IV SOLN
10.0000 mg | INTRAVENOUS | Status: DC | PRN
  Administered 2017-09-20: 10 mg via INTRAVENOUS
  Filled 2017-09-19: qty 4

## 2017-09-19 MED ORDER — CHLORHEXIDINE GLUCONATE CLOTH 2 % EX PADS
6.0000 | MEDICATED_PAD | Freq: Every day | CUTANEOUS | Status: DC
  Administered 2017-09-20 – 2017-09-23 (×3): 6 via TOPICAL

## 2017-09-19 MED ORDER — OXYCODONE HCL 5 MG PO TABS
5.0000 mg | ORAL_TABLET | ORAL | Status: DC | PRN
  Administered 2017-09-19 – 2017-09-23 (×14): 10 mg via ORAL
  Filled 2017-09-19 (×14): qty 2

## 2017-09-19 MED ORDER — ONDANSETRON HCL 4 MG/2ML IJ SOLN: 4.0000 mg | Freq: Four times a day (QID) | INTRAMUSCULAR | Status: DC | PRN

## 2017-09-19 MED ORDER — RENA-VITE PO TABS
1.0000 | ORAL_TABLET | Freq: Every day | ORAL | Status: DC
  Administered 2017-09-19 – 2017-09-22 (×4): 1 via ORAL
  Filled 2017-09-19 (×4): qty 1

## 2017-09-19 MED ORDER — HYDRALAZINE HCL 20 MG/ML IJ SOLN: 5.0000 mg | INTRAMUSCULAR | Status: DC | PRN

## 2017-09-19 MED ORDER — SODIUM CHLORIDE 0.9% FLUSH
3.0000 mL | Freq: Two times a day (BID) | INTRAVENOUS | Status: DC
  Administered 2017-09-19 – 2017-09-23 (×6): 3 mL via INTRAVENOUS

## 2017-09-19 MED ORDER — CARVEDILOL 25 MG PO TABS
25.0000 mg | ORAL_TABLET | Freq: Two times a day (BID) | ORAL | Status: DC
  Administered 2017-09-19 – 2017-09-23 (×5): 25 mg via ORAL
  Filled 2017-09-19 (×2): qty 2
  Filled 2017-09-19 (×4): qty 1

## 2017-09-19 MED ORDER — PANTOPRAZOLE SODIUM 40 MG PO TBEC
40.0000 mg | DELAYED_RELEASE_TABLET | Freq: Every day | ORAL | Status: DC
  Administered 2017-09-19 – 2017-09-23 (×5): 40 mg via ORAL
  Filled 2017-09-19 (×5): qty 1

## 2017-09-19 MED ORDER — NEPRO/CARBSTEADY PO LIQD
237.0000 mL | Freq: Two times a day (BID) | ORAL | Status: DC
  Administered 2017-09-19 – 2017-09-23 (×6): 237 mL via ORAL

## 2017-09-19 MED ORDER — ACETAMINOPHEN 325 MG PO TABS: 325.0000 mg | ORAL_TABLET | ORAL | Status: DC | PRN

## 2017-09-19 MED ORDER — MORPHINE SULFATE (PF) 4 MG/ML IV SOLN
4.0000 mg | Freq: Once | INTRAVENOUS | Status: AC
  Administered 2017-09-19: 4 mg via INTRAVENOUS
  Filled 2017-09-19: qty 1

## 2017-09-19 MED ORDER — CALCITRIOL 0.25 MCG PO CAPS
0.2500 ug | ORAL_CAPSULE | ORAL | Status: DC
  Administered 2017-09-19 – 2017-09-22 (×3): 0.25 ug via ORAL
  Filled 2017-09-19 (×2): qty 1

## 2017-09-19 MED ORDER — METOPROLOL TARTRATE 5 MG/5ML IV SOLN: 2.0000 mg | INTRAVENOUS | Status: DC | PRN

## 2017-09-19 MED ORDER — PHENOL 1.4 % MT LIQD
1.0000 | OROMUCOSAL | Status: DC | PRN
  Filled 2017-09-19: qty 177

## 2017-09-19 MED ORDER — HEPARIN (PORCINE) IN NACL 100-0.45 UNIT/ML-% IJ SOLN
1250.0000 [IU]/h | INTRAMUSCULAR | Status: DC
  Administered 2017-09-19: 1200 [IU]/h via INTRAVENOUS
  Administered 2017-09-19 – 2017-09-23 (×5): 1400 [IU]/h via INTRAVENOUS
  Filled 2017-09-19 (×7): qty 250

## 2017-09-19 MED ORDER — CLOPIDOGREL BISULFATE 75 MG PO TABS
75.0000 mg | ORAL_TABLET | Freq: Every day | ORAL | Status: DC
  Administered 2017-09-19 – 2017-09-23 (×5): 75 mg via ORAL
  Filled 2017-09-19 (×5): qty 1

## 2017-09-19 MED ORDER — FLUTICASONE FUROATE-VILANTEROL 200-25 MCG/INH IN AEPB
1.0000 | INHALATION_SPRAY | Freq: Every day | RESPIRATORY_TRACT | Status: DC
  Administered 2017-09-19 – 2017-09-23 (×5): 1 via RESPIRATORY_TRACT
  Filled 2017-09-19: qty 28

## 2017-09-19 MED ORDER — GUAIFENESIN-DM 100-10 MG/5ML PO SYRP: 15.0000 mL | ORAL_SOLUTION | ORAL | Status: DC | PRN

## 2017-09-19 MED ORDER — MORPHINE SULFATE (PF) 4 MG/ML IV SOLN
2.0000 mg | INTRAVENOUS | Status: DC | PRN
  Administered 2017-09-21 – 2017-09-22 (×6): 4 mg via INTRAVENOUS
  Filled 2017-09-19 (×6): qty 1

## 2017-09-19 MED ORDER — ACETAMINOPHEN 325 MG RE SUPP: 325.0000 mg | RECTAL | Status: DC | PRN

## 2017-09-19 NOTE — ED Notes (Signed)
Attempted report x1. RN unavailable. Call back # left.  

## 2017-09-19 NOTE — Consult Note (Addendum)
[name] Renal Consultation Note    Indication for Consultation:  Management of ESRD/hemodialysis; anemia, hypertension/volume and secondary hyperparathyroidism   HPI: Aaron [name] is a 62 y.o. 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 HD initiated 09/01/17 during previous [facility] admission. PMH also significant for HTN, PAD (s/p femoral endarertectomy/stenting), Hx MI (no cardiac stents), former substance abuse, COPD, P A-fib (on warfarin), Hep C neg viral load.    He is admitted for evaluation of R groin seroma. He is s/p right iliac stent right femoral endarterectomy and right SFA stent by Dr Trula Slade 08/14/17. Noted that he developed right groin swelling a few weeks after the proceure. Plan for conservative management at that time. He presented to the ED last night with worsening grain swelling and pain. VVS following and plans for I&D Monday.   Seen sitting in recliner at bedside. Pain controlled today. Has no complaints. Denies fevers, chills, CP, SOB, abd pain, N/V.    Dialyzes at [place]. He has been doing okay with dialysis so far, but is thinking about doing dialysis at home. Feels like it's too much hassle to have to go the the center 3 times/week. Dialyzes via [facility] currently. Had 1st stage BVT placed 09/04/17 with Dr. Trula Slade.   Past Medical History:  Diagnosis Date  . Anxiety   . Arthritis    knees , back , shoulders  . CAD (coronary artery disease)    Mild nonobstructive disease at cardiac catheterization 2002  . Chronic back pain   . CKD (chronic kidney disease), stage II   . COPD (chronic obstructive pulmonary disease) ([place])   . Diverticulitis   . Esophageal reflux   . Essential hypertension   . Hepatitis C    states he no longer has this  . History of kidney stones   . History of syncope   . Hyperlipidemia   . Nephrolithiasis   . PAT (paroxysmal atrial tachycardia) ([place])   . Peripheral arterial disease (HCC)    Occluded left superficial femoral  artery status post stent June 2016 - Dr. Trula Slade  . Pneumonia    as child   60 years old  . Polycystic kidney, unspecified type   . Syncope 09/2014   Past Surgical History:  Procedure Laterality Date  . ABDOMINAL AORTOGRAM N/A 08/11/2017   Procedure: ABDOMINAL AORTOGRAM;  Surgeon: Serafina Mitchell, MD;  [facility];  Service: Cardiovascular;  Laterality: N/A;  . APPLICATION OF WOUND VAC Right 08/14/2017   Procedure: APPLICATION OF PREVENA INCISIONAL WOUND VAC RIGHT GROIN;  Surgeon: Serafina Mitchell, MD;  [facility];  Service: Vascular;  Laterality: Right;  . AV FISTULA PLACEMENT Left 09/04/2017   Procedure: ARTERIOVENOUS (AV) FISTULA CREATION LEFT UPPER ARM;  Surgeon: Serafina Mitchell, MD;  Location: [place];  Service: Vascular;  Laterality: Left;  . BACK SURGERY    . BIOPSY  12/17/2016   Procedure: BIOPSY;  Surgeon: Daneil Dolin, MD;  [facility];  Service: Gastroenterology;;  gastric colon  . BIOPSY  04/29/2017   Procedure: BIOPSY;  Surgeon: Daneil Dolin, MD;  [facility];  Service: Endoscopy;;  duodenal biopsies  . BUNIONECTOMY    . CERVICAL SPINE SURGERY    . COLONOSCOPY  2008   Dr. Oneida Alar: rare sigmoid colon diverticulosis, internal hemorrhoids.   . COLONOSCOPY WITH PROPOFOL N/A 12/17/2016   dense left-sided diverticulosis, right colon ulcers s/p biopsy query occult NSAID use vs transient ischemia, not consistent with  IBD. CMV stains negative.   Marland Kitchen ENDARTERECTOMY FEMORAL Right 08/14/2017   Procedure: RIGHT ILLIO-FEMORAL ENDARTERECTOMY;  Surgeon: Serafina Mitchell, MD;  Location: [facility] OR;  Service: Vascular;  Laterality: Right;  . ESOPHAGEAL DILATION  12/17/2016   EGD with mild Schatzki's ring s/p dilatation, small hiatal hernia, erosive gastropathy (negative H.pylori gastritis)  . ESOPHAGOGASTRODUODENOSCOPY  2008   Dr. Oneida Alar: normal esophagus without Barrett's, antritis and duodenitis, path with H.pylori gastritis  . ESOPHAGOGASTRODUODENOSCOPY  (EGD) WITH PROPOFOL N/A 12/17/2016   Procedure: ESOPHAGOGASTRODUODENOSCOPY (EGD) WITH PROPOFOL;  Surgeon: Daneil Dolin, MD;  [facility];  Service: Gastroenterology;  Laterality: N/A;  . ESOPHAGOGASTRODUODENOSCOPY (EGD) WITH PROPOFOL N/A 04/29/2017   Patchy erythema of gastric mucosa diffusely, extensive inflammatory changes in duodenum, geographic ulceration and mucosal edema present, encroaching somewhat on the lumen yet still widely patent, distal second portion of duodenum appeared abnormal, path with peptic duodenitis with ulceration  . HERNIA REPAIR     umbilical  . INSERTION OF DIALYSIS CATHETER Right 09/04/2017   Procedure: INSERTION OF TUNNELED  DIALYSIS CATHETER - RIGHT INTERNAL JUGULAR PLACEMENT;  Surgeon: Serafina Mitchell, MD;  Location: [place];  Service: Vascular;  Laterality: Right;  . INSERTION OF ILIAC STENT Right 08/14/2017   Procedure: INSERTION OF RIGHT COMMON ILIAC STENT 44mm x 19mm x 130cm INSERTION OF RIGHT EXTERNAL ILIAC STENT 41mm x 64mm x 130cm INSERTION OF SUPERFICIAL FERMORAL ARTERY STENT 1mm x 30mm x 130cm;  Surgeon: Serafina Mitchell, MD;  Location: [facility] OR;  Service: Vascular;  Laterality: Right;  . IR FLUORO GUIDE CV LINE RIGHT  08/31/2017  . IR US GUIDE VASC ACCESS RIGHT  08/31/2017  . laparoscopic inguinal hernia right  02/2017   [place]  . LOWER EXTREMITY ANGIOGRAPHY Right 08/11/2017   Procedure: LOWER EXTREMITY ANGIOGRAPHY;  Surgeon: Serafina Mitchell, MD;  Location: [place] [facility] LAB;  Service: Cardiovascular;  Laterality: Right;  . PATCH ANGIOPLASTY Right 08/14/2017   Procedure: PATCH ANGIOPLASTY USING HEMASHIELD PATCH 0.3IN Lillie Columbia;  Surgeon: Serafina Mitchell, MD;  [facility];  Service: Vascular;  Laterality: Right;  . PERIPHERAL VASCULAR CATHETERIZATION N/A 09/20/2014   Procedure: Abdominal Aortogram;  Surgeon: Serafina Mitchell, MD;  Location: [place] CV LAB;  Service: Cardiovascular;  Laterality: N/A;   Family History  Problem Relation Age of Onset  .  Alcoholism Mother   . Heart disease Father        Massive heart attack  . Heart attack Father   . Atrial fibrillation Father   . Colon cancer Father   . Colon cancer Maternal Grandfather 67  . Alcoholism Maternal Grandfather   . Renal cancer Cousin   . Ovarian cancer Sister    Social History:  reports that he quit smoking about 6 weeks ago. His smoking use included cigarettes. He has a 23.50 pack-year smoking history. He has quit using smokeless tobacco. His smokeless tobacco use included snuff and chew. He reports that he has current or past drug history. He reports that he does not drink alcohol. No Known Allergies Prior to Admission medications   Medication Sig Start Date End Date Taking? Authorizing Provider  BREO ELLIPTA 200-25 MCG/INH AEPB Inhale 1 puff into the lungs daily. 07/27/17   [provider]  calcitRIOL (ROCALTROL) 0.25 MCG capsule Take 1 capsule (0.25 mcg total) by mouth Every Tuesday,Thursday,and Saturday with dialysis. 09/08/17   Ghimire, Henreitta Leber, MD  carvedilol (COREG) 25 MG tablet Take 1 tablet (25 mg total) 2 (two) times  daily by mouth. 02/25/17 08/24/17  Imogene Burn, PA-C  cholecalciferol (VITAMIN D) 1000 units tablet Take 2,000 Units by mouth daily.    [provider]  clopidogrel (PLAVIX) 75 MG tablet Take 1 tablet (75 mg total) by mouth daily. 08/16/17   Ulyses Amor, PA-C  oxyCODONE-acetaminophen (PERCOCET/ROXICET) 5-325 MG tablet Take 1 tablet by mouth every 6 (six) hours as needed. Patient taking differently: Take 1 tablet by mouth every 3 (three) hours as needed for moderate pain.  08/16/17   Ulyses Amor, PA-C  pantoprazole (PROTONIX) 40 MG tablet Take 1 tablet (40 mg total) by mouth 2 (two) times daily. 04/30/17 08/14/17  Manuella Ghazi, Pratik D, DO  warfarin (COUMADIN) 5 MG tablet Take 1.5 tablets (7.5 mg total) by mouth daily at 6 PM. 09/05/17   Ghimire, Henreitta Leber, MD   Current Facility-Administered Medications  Medication Dose Route Frequency  Provider Last Rate Last Dose  . 0.9 %  sodium chloride infusion  250 mL Intravenous PRN Elam Dutch, MD      . acetaminophen (TYLENOL) tablet 325-650 mg  325-650 mg Oral Q4H PRN Elam Dutch, MD       Or  . acetaminophen (TYLENOL) suppository 325-650 mg  325-650 mg Rectal Q4H PRN Elam Dutch, MD      . calcitRIOL (ROCALTROL) capsule 0.25 mcg  0.25 mcg Oral Q T,Th,Sa-HD Elam Dutch, MD   0.25 mcg at 09/19/17 1351  . carvedilol (COREG) tablet 25 mg  25 mg Oral BID WC Elam Dutch, MD   25 mg at 09/19/17 0851  . [START ON 09/20/2017] Chlorhexidine Gluconate Cloth 2 % PADS 6 each  6 each Topical Q0600 Lynnda Child, PA-C      . cholecalciferol (VITAMIN D) tablet 2,000 Units  2,000 Units Oral Daily Elam Dutch, MD   2,000 Units at 09/19/17 1116  . clopidogrel (PLAVIX) tablet 75 mg  75 mg Oral Daily Elam Dutch, MD   75 mg at 09/19/17 1116  . docusate sodium (COLACE) capsule 100 mg  100 mg Oral BID Elam Dutch, MD   100 mg at 09/19/17 1117  . feeding supplement (NEPRO CARB STEADY) liquid 237 mL  237 mL Oral BID BM Elam Dutch, MD   237 mL at 09/19/17 1409  . fluticasone furoate-vilanterol (BREO ELLIPTA) 200-25 MCG/INH 1 puff  1 puff Inhalation Daily Elam Dutch, MD   1 puff at 09/19/17 1022  . guaiFENesin-dextromethorphan (ROBITUSSIN DM) 100-10 MG/5ML syrup 15 mL  15 mL Oral Q4H PRN Elam Dutch, MD      . heparin ADULT infusion 100 units/mL (25000 units/249mL sodium chloride 0.45%)  1,400 Units/hr Intravenous Continuous Kris Mouton, RPH 14 mL/hr at 09/19/17 1351 1,400 Units/hr at 09/19/17 1351  . hydrALAZINE (APRESOLINE) injection 5 mg  5 mg Intravenous Q20 Min PRN Fields, Jessy Oto, MD      . labetalol (NORMODYNE,TRANDATE) injection 10 mg  10 mg Intravenous Q10 min PRN Elam Dutch, MD      . metoprolol tartrate (LOPRESSOR) injection 2-5 mg  2-5 mg Intravenous Q2H PRN Elam Dutch, MD      . morphine 4 MG/ML injection  2-5 mg  2-5 mg Intravenous Q1H PRN Elam Dutch, MD      . multivitamin (RENA-VIT) tablet 1 tablet  1 tablet Oral QHS Elam Dutch, MD      . ondansetron [facility]) injection 4 mg  4 mg Intravenous Q6H PRN  Elam Dutch, MD      . oxyCODONE (Oxy IR/ROXICODONE) immediate release tablet 5-10 mg  5-10 mg Oral Q4H PRN Elam Dutch, MD   10 mg at 09/19/17 0905  . pantoprazole (PROTONIX) EC tablet 40 mg  40 mg Oral Daily Elam Dutch, MD   40 mg at 09/19/17 1117  . phenol (CHLORASEPTIC) mouth spray 1 spray  1 spray Mouth/Throat PRN Elam Dutch, MD      . sodium chloride flush (NS) 0.9 % injection 3 mL  3 mL Intravenous Q12H Elam Dutch, MD   3 mL at 09/19/17 1117  . sodium chloride flush (NS) 0.9 % injection 3 mL  3 mL Intravenous PRN Elam Dutch, MD        ROS: As per HPI otherwise negative.  Physical Exam: Vitals:   09/19/17 0553 09/19/17 0554 09/19/17 1023 09/19/17 1223  BP: (!) 152/79  132/81 136/74  Pulse:  70 70 66  Resp: 19 19 20 18   Temp:    97.8 F (36.6 C)  TempSrc:    Oral  SpO2:  95% 94% 98%  Weight:      Height:         General: WDWN male NAD  Head: NCAT sclera not icteric MMM Neck: Supple. No JVD No masses Lungs: CTA bilaterally without wheezes, rales, or rhonchi. Breathing is unlabored. Heart: RRR with S1 S2 Abdomen: soft NT + BS R groin bandaged  Lower extremities:  No LE edema No open wounds  Neuro: A & O  X 3. Moves all extremities spontaneously. Psych:  Responds to questions appropriately with a normal affect. Dialysis Access: RIJ TDC dsg clean. L forearm BVT +bruit   Labs: Basic Metabolic Panel: Recent Labs  Lab 09/18/17 2225 09/19/17 0540  NA 135 136  K 3.1* 3.0*  CL 92* 96*  CO2 29 29  GLUCOSE 100* 100*  BUN 26* 26*  CREATININE 4.42* 4.66*  CALCIUM 8.4* 8.0*   Liver Function Tests: No results for input(s): AST, ALT, ALKPHOS, BILITOT, PROT, ALBUMIN in the last 168 hours. No results for input(s): LIPASE,  AMYLASE in the last 168 hours. No results for input(s): AMMONIA in the last 168 hours. CBC: Recent Labs  Lab 09/18/17 2225 09/19/17 0540  WBC 6.7 5.3  NEUTROABS 4.8  --   HGB 11.6* 10.8*  HCT 36.2* 33.3*  MCV 102.3* 100.6*  PLT 229 199   Cardiac Enzymes: No results for input(s): CKTOTAL, CKMB, CKMBINDEX, TROPONINI in the last 168 hours. CBG: No results for input(s): GLUCAP in the last 168 hours. Iron Studies: No results for input(s): IRON, TIBC, TRANSFERRIN, FERRITIN in the last 72 hours. Studies/Results: No results found.  Dialysis Orders:  [place] [facility] 4h 180NRe 400/800 EDW 91kg 2K/2.5Ca  R IJ TDC L AVF maturing  Hep 2700 U Calcitriol 0.47mcg TIW   Assessment/Plan: 1. R groin seroma. VVS following. For I &D Monday  2. ESRD -  TTS. Continue on schedule. For HD today  3. Hypertension/volume  - BP controlled/ No volume excess on exam. Meeting outpatient dry weight 4. Anemia  - Hgb 10.8. No ESA needs currently.  5. Metabolic bone disease -  Continue calcitriol/Ca acetate binder  6. Nutrition - Renal diet/vitamins  7. AFib - Holding Coumadin. Heparin per pharmacy   8. PAD  S/p right iliac stent right femoral endarterectomy and right SFA stenting 5/3    Lynnda Child PA-C [facility] Pager [phone] 09/19/2017, 2:39 PM  Pt seen, examined and agree w A/P as above.  Kelly Splinter MD [employer] pager [phone]   09/19/2017, 3:23 PM

## 2017-09-19 NOTE — Progress Notes (Signed)
Aaron [name] for Heparin (holding warfarin) Indication: atrial fibrillation  No Known Allergies  Patient Measurements: Height: 6\' 4"  (193 cm) Weight: 197 lb 6.4 oz (89.5 kg) IBW/kg (Calculated) : 86.8  Vital Signs: Temp: 97.7 F (36.5 C) (06/08 2034) Temp Source: Oral (06/08 2034) BP: 164/79 (06/08 2034) Pulse Rate: 55 (06/08 2034)  Labs: Recent Labs    09/18/17 2225 09/19/17 0540 09/19/17 1042 09/19/17 2137  HGB 11.6* 10.8*  --   --   HCT 36.2* 33.3*  --   --   PLT 229 199  --   --   LABPROT 20.2* 21.0*  --   --   INR 1.74 1.83  --   --   HEPARINUNFRC  --   --  0.23* 0.45  CREATININE 4.42* 4.66*  --   --     Estimated Creatinine Clearance: 20.4 mL/min (A) (by C-G formula based on SCr of 4.66 mg/dL (H)).   Medical History: Past Medical History:  Diagnosis Date  . Anxiety   . Arthritis    knees , back , shoulders  . CAD (coronary artery disease)    Mild nonobstructive disease at cardiac catheterization 2002  . Chronic back pain   . CKD (chronic kidney disease), stage II   . COPD (chronic obstructive pulmonary disease) ([place])   . Diverticulitis   . Esophageal reflux   . Essential hypertension   . Hepatitis C    states he no longer has this  . History of kidney stones   . History of syncope   . Hyperlipidemia   . Nephrolithiasis   . PAT (paroxysmal atrial tachycardia) ([place])   . Peripheral arterial disease (HCC)    Occluded left superficial femoral artery status post stent June 2016 - Dr. Trula Slade  . Pneumonia    as child   63 years old  . Polycystic kidney, unspecified type   . Syncope 09/2014    Assessment: 62 y/o M on warfarin PTA for afib, now with increasing seroma of the right leg, holding warfarin and heparin has started. -heparin level= 0.45  Goal of Therapy:  Heparin level goal 0.3-0.5 due to seroma Monitor platelets by anticoagulation protocol: Yes   Plan:  -No heparin changes needed -Recheck heparin  level with CBC in am  Hildred Laser, PharmD Clinical Pharmacist 09/19/2017 10:11 PM

## 2017-09-19 NOTE — H&P (Signed)
Referring Physician: Dr Kathrynn Humble  Patient name: Aaron [name] MRN: [id number] DOB: [birth year] Sex: male  HPI: Aaron [name] is a 62 y.o. male s/p right iliac stent right femoral endarterectomy and right SFA stent by Dr Trula Slade 08/14/17.  The patient developed swelling in his right groin about 3 weeks ago and was seen by Dr Trula Slade.  The hematoma was 2 x 6 cm by CT scan at that time.  This was thought to be a hematoma and managed conservatively.  It has slowly increased in size. He denies any fever or chills.  The patient is on coumadin for afib.  He recently has first stage basilic fistula placed by Dr Trula Slade and placement of a dialysis catheter about 2 weeks ago.  Past Medical History:  Diagnosis Date  . Anxiety   . Arthritis    knees , back , shoulders  . CAD (coronary artery disease)    Mild nonobstructive disease at cardiac catheterization 2002  . Chronic back pain   . CKD (chronic kidney disease), stage II   . COPD (chronic obstructive pulmonary disease) ([place])   . Diverticulitis   . Esophageal reflux   . Essential hypertension   . Hepatitis C    states he no longer has this  . History of kidney stones   . History of syncope   . Hyperlipidemia   . Nephrolithiasis   . PAT (paroxysmal atrial tachycardia) ([place])   . Peripheral arterial disease (HCC)    Occluded left superficial femoral artery status post stent June 2016 - Dr. Trula Slade  . Pneumonia    as child   85 years old  . Polycystic kidney, unspecified type   . Syncope 09/2014   Past Surgical History:  Procedure Laterality Date  . ABDOMINAL AORTOGRAM N/A 08/11/2017   Procedure: ABDOMINAL AORTOGRAM;  Surgeon: Serafina Mitchell, MD;  Location: [facility];  Service: Cardiovascular;  Laterality: N/A;  . APPLICATION OF WOUND VAC Right 08/14/2017   Procedure: APPLICATION OF PREVENA INCISIONAL WOUND VAC RIGHT GROIN;  Surgeon: Serafina Mitchell, MD;  Location[facility];  Service: Vascular;  Laterality: Right;  . AV FISTULA  PLACEMENT Left 09/04/2017   Procedure: ARTERIOVENOUS (AV) FISTULA CREATION LEFT UPPER ARM;  Surgeon: Serafina Mitchell, MD;  Location: [facility];  Service: Vascular;  Laterality: Left;  . BACK SURGERY    . BIOPSY  12/17/2016   Procedure: BIOPSY;  Surgeon: Daneil Dolin, MD;  [facility];  Service: Gastroenterology;;  gastric colon  . BIOPSY  04/29/2017   Procedure: BIOPSY;  Surgeon: Daneil Dolin, MD;  [facility];  Service: Endoscopy;;  duodenal biopsies  . BUNIONECTOMY    . CERVICAL SPINE SURGERY    . COLONOSCOPY  2008   Dr. Oneida Alar: rare sigmoid colon diverticulosis, internal hemorrhoids.   . COLONOSCOPY WITH PROPOFOL N/A 12/17/2016   dense left-sided diverticulosis, right colon ulcers s/p biopsy query occult NSAID use vs transient ischemia, not consistent with IBD. CMV stains negative.   Marland Kitchen ENDARTERECTOMY FEMORAL Right 08/14/2017   Procedure: RIGHT ILLIO-FEMORAL ENDARTERECTOMY;  Surgeon: Serafina Mitchell, MD;  Location: [facility] OR;  Service: Vascular;  Laterality: Right;  . ESOPHAGEAL DILATION  12/17/2016   EGD with mild Schatzki's ring s/p dilatation, small hiatal hernia, erosive gastropathy (negative H.pylori gastritis)  . ESOPHAGOGASTRODUODENOSCOPY  2008   Dr. Oneida Alar: normal esophagus without Barrett's, antritis and duodenitis, path with H.pylori gastritis  . ESOPHAGOGASTRODUODENOSCOPY (EGD) WITH PROPOFOL N/A 12/17/2016  Procedure: ESOPHAGOGASTRODUODENOSCOPY (EGD) WITH PROPOFOL;  Surgeon: Daneil Dolin, MD;  [facility];  Service: Gastroenterology;  Laterality: N/A;  . ESOPHAGOGASTRODUODENOSCOPY (EGD) WITH PROPOFOL N/A 04/29/2017   Patchy erythema of gastric mucosa diffusely, extensive inflammatory changes in duodenum, geographic ulceration and mucosal edema present, encroaching somewhat on the lumen yet still widely patent, distal second portion of duodenum appeared abnormal, path with peptic duodenitis with ulceration  . HERNIA REPAIR     umbilical  . INSERTION OF  DIALYSIS CATHETER Right 09/04/2017   Procedure: INSERTION OF TUNNELED  DIALYSIS CATHETER - RIGHT INTERNAL JUGULAR PLACEMENT;  Surgeon: Serafina Mitchell, MD;  Location: [facility];  Service: Vascular;  Laterality: Right;  . INSERTION OF ILIAC STENT Right 08/14/2017   Procedure: INSERTION OF RIGHT COMMON ILIAC STENT 1mm x 73mm x 130cm INSERTION OF RIGHT EXTERNAL ILIAC STENT 34mm x 36mm x 130cm INSERTION OF SUPERFICIAL FERMORAL ARTERY STENT 41mm x 61mm x 130cm;  Surgeon: Serafina Mitchell, MD;  Location: [facility] OR;  Service: Vascular;  Laterality: Right;  . IR FLUORO GUIDE CV LINE RIGHT  08/31/2017  . IR US GUIDE VASC ACCESS RIGHT  08/31/2017  . laparoscopic inguinal hernia right  02/2017   [place]  . LOWER EXTREMITY ANGIOGRAPHY Right 08/11/2017   Procedure: LOWER EXTREMITY ANGIOGRAPHY;  Surgeon: Serafina Mitchell, MD;  Location: [facility] [facility];  Service: Cardiovascular;  Laterality: Right;  . PATCH ANGIOPLASTY Right 08/14/2017   Procedure: PATCH ANGIOPLASTY USING HEMASHIELD PATCH 0.3IN Lillie Columbia;  Surgeon: Serafina Mitchell, MD;  [facility];  Service: Vascular;  Laterality: Right;  . PERIPHERAL VASCULAR CATHETERIZATION N/A 09/20/2014   Procedure: Abdominal Aortogram;  Surgeon: Serafina Mitchell, MD;  Location: [facility];  Service: Cardiovascular;  Laterality: N/A;    Family History  Problem Relation Age of Onset  . Alcoholism Mother   . Heart disease Father        Massive heart attack  . Heart attack Father   . Atrial fibrillation Father   . Colon cancer Father   . Colon cancer Maternal Grandfather 6  . Alcoholism Maternal Grandfather   . Renal cancer Cousin   . Ovarian cancer Sister     SOCIAL HISTORY: Social History   Socioeconomic History  . Marital status: Legally Separated    Spouse name: Not on file  . Number of children: 2  . Years of education: Not on file  . Highest education level: Not on file  Occupational History  . Occupation: DISABLED  Social Needs  . Financial resource  strain: Not on file  . Food insecurity:    Worry: Not on file    Inability: Not on file  . Transportation needs:    Medical: Not on file    Non-medical: Not on file  Tobacco Use  . Smoking status: Former Smoker    Packs/day: 0.50    Years: 47.00    Pack years: 23.50    Types: Cigarettes    Last attempt to quit: 08/03/2017    Years since quitting: 0.1  . Smokeless tobacco: Former Systems developer    Types: Snuff, Chew  Substance and Sexual Activity  . Alcohol use: No    Alcohol/week: 0.0 oz    Comment: alcohol free 2017,  heavy drinker in the past  . Drug use: Not Currently    Comment: states in remote past he used "whatever was around"  . Sexual activity: Not on file  Lifestyle  . Physical activity:  Days per week: Not on file    Minutes per session: Not on file  . Stress: Not on file  Relationships  . Social connections:    Talks on phone: Not on file    Gets together: Not on file    Attends religious service: Not on file    Active member of club or organization: Not on file    Attends meetings of clubs or organizations: Not on file    Relationship status: Not on file  . Intimate partner violence:    Fear of current or ex partner: Not on file    Emotionally abused: Not on file    Physically abused: Not on file    Forced sexual activity: Not on file  Other Topics Concern  . Not on file  Social History Narrative   Disabled from Back since 2007    No Known Allergies  No current facility-administered medications for this encounter.    Current Outpatient Medications  Medication Sig Dispense Refill  . BREO ELLIPTA 200-25 MCG/INH AEPB Inhale 1 puff into the lungs daily.    . calcitRIOL (ROCALTROL) 0.25 MCG capsule Take 1 capsule (0.25 mcg total) by mouth Every Tuesday,Thursday,and Saturday with dialysis. 30 capsule 0  . carvedilol (COREG) 25 MG tablet Take 1 tablet (25 mg total) 2 (two) times daily by mouth. 180 tablet 3  . cholecalciferol (VITAMIN D) 1000 units tablet Take  2,000 Units by mouth daily.    . clopidogrel (PLAVIX) 75 MG tablet Take 1 tablet (75 mg total) by mouth daily. 30 tablet 3  . oxyCODONE-acetaminophen (PERCOCET/ROXICET) 5-325 MG tablet Take 1 tablet by mouth every 6 (six) hours as needed. (Patient taking differently: Take 1 tablet by mouth every 3 (three) hours as needed for moderate pain. ) 20 tablet 0  . pantoprazole (PROTONIX) 40 MG tablet Take 1 tablet (40 mg total) by mouth 2 (two) times daily. 60 tablet 1  . warfarin (COUMADIN) 5 MG tablet Take 1.5 tablets (7.5 mg total) by mouth daily at 6 PM. 30 tablet 0    ROS:   General:  No weight loss, Fever, chills  HEENT: No recent headaches, no nasal bleeding, no visual changes, no sore throat  Neurologic: No dizziness, blackouts, seizures. No recent symptoms of stroke or mini- stroke. No recent episodes of slurred speech, or temporary blindness.  Cardiac: No recent episodes of chest pain/pressure, no shortness of breath at rest.  No shortness of breath with exertion.  Denies history of atrial fibrillation or irregular heartbeat  Pulmonary: No home oxygen, no productive cough, no hemoptysis,  No asthma or wheezing    Physical Examination  Vitals:   09/18/17 2130 09/18/17 2300 09/18/17 2330 09/19/17 0000  BP: (!) 146/84 130/72 (!) 156/91 (!) 155/90  Pulse: 64 69 64 64  Resp:   18   Temp:      TempSrc:      SpO2: 97% 95% 94% 95%  Weight:      Height:        Body mass index is 25.32 kg/m.  General:  Alert and oriented, no acute distress HEENT: Normal Neck: No bruit or JVD Pulmonary: Clear to auscultation bilaterally Cardiac: Regular Rate and Rhythm without murmur Abdomen: Soft, non-tender, non-distended, no mass Skin: No rash Extremity Pulses:  2+ radial, brachial, femoral, dorsalis pedis pulses right leg, 5 x 4 cm fluctuant mass right groin medial inferior aspect of incision Musculoskeletal: No deformity or edema  Neurologic: Upper and lower extremity motor 5/5 and  symmetric  DATA:  CBC    Component Value Date/Time   WBC 6.7 09/18/2017 2225   RBC 3.54 (L) 09/18/2017 2225   HGB 11.6 (L) 09/18/2017 2225   HCT 36.2 (L) 09/18/2017 2225   PLT 229 09/18/2017 2225   MCV 102.3 (H) 09/18/2017 2225   MCH 32.8 09/18/2017 2225   MCHC 32.0 09/18/2017 2225   RDW 14.9 09/18/2017 2225   LYMPHSABS 1.0 09/18/2017 2225   MONOABS 0.7 09/18/2017 2225   EOSABS 0.2 09/18/2017 2225   BASOSABS 0.1 09/18/2017 2225     ASSESSMENT:  Enlarging seroma right leg    PLAN:  Allow INR to drift down hold coumadin transition to heparin I and D right groin on Monday   Ruta Hinds, MD [facility] Office: [phone] Pager: [phone]

## 2017-09-19 NOTE — ED Provider Notes (Signed)
Patient transferred from [facility] for evaluation by Vascular Surgery. S/p recent femoral endarterectomy and SFA stent. Exam c/f pseudoaneurysm, other vascular injury. Admit to Dr. Oneida Alar.   Duffy Bruce, MD 09/19/17 [phone]

## 2017-09-19 NOTE — Progress Notes (Signed)
ANTICOAGULATION CONSULT NOTE - Initial Consult  Pharmacy Consult for Heparin (holding warfarin) Indication: atrial fibrillation  No Known Allergies  Patient Measurements: Height: 6\' 4"  (193 cm) Weight: 208 lb (94.3 kg) IBW/kg (Calculated) : 86.8  Vital Signs: Temp: 98.4 F (36.9 C) (06/07 1811) Temp Source: Oral (06/07 1811) BP: 150/79 (06/08 0100) Pulse Rate: 63 (06/08 0100)  Labs: Recent Labs    09/18/17 2225  HGB 11.6*  HCT 36.2*  PLT 229  LABPROT 20.2*  INR 1.74  CREATININE 4.42*    Estimated Creatinine Clearance: 21.5 mL/min (A) (by C-G formula based on SCr of 4.42 mg/dL (H)).   Medical History: Past Medical History:  Diagnosis Date  . Anxiety   . Arthritis    knees , back , shoulders  . CAD (coronary artery disease)    Mild nonobstructive disease at cardiac catheterization 2002  . Chronic back pain   . CKD (chronic kidney disease), stage II   . COPD (chronic obstructive pulmonary disease) ([place])   . Diverticulitis   . Esophageal reflux   . Essential hypertension   . Hepatitis C    states he no longer has this  . History of kidney stones   . History of syncope   . Hyperlipidemia   . Nephrolithiasis   . PAT (paroxysmal atrial tachycardia) ([place])   . Peripheral arterial disease (HCC)    Occluded left superficial femoral artery status post stent June 2016 - Dr. Trula Slade  . Pneumonia    as child   62 years old  . Polycystic kidney, unspecified type   . Syncope 09/2014    Assessment: 62 y/o M on warfarin PTA for afib, now with increasing seroma of the right leg, holding warfarin and starting heparin in anticipation of OR on Monday, INR is <2, will start heparin now. Hgb 11.6.   Goal of Therapy:  Heparin level 0.3-0.7 units/ml Monitor platelets by anticoagulation protocol: Yes   Plan:  -Start heparin drip at 1200 units/hr -1100 HL -Daily CBC/HL -Monitor for bleeding  Narda Bonds 09/19/2017,2:24 AM

## 2017-09-19 NOTE — ED Notes (Signed)
ED TO INPATIENT HANDOFF REPORT  Name/Age/Gender Aaron [name] 62 y.o. male  Code Status    Code Status Orders  (From admission, onward)        Start     Ordered   09/19/17 0155  Full code  Continuous     09/19/17 0157    Code Status History    Date Active Date Inactive Code Status Order ID Comments User Context   08/24/2017 2215 09/05/2017 1910 Full Code [id number]  Reubin Milan, MD Inpatient   08/24/2017 1939 08/24/2017 2214 Full Code [id number]  Reubin Milan, MD ED   08/14/2017 1737 08/16/2017 1507 Full Code [id number]  Serafina Mitchell, MD Inpatient   08/11/2017 1655 08/11/2017 2141 Full Code [id number]  Serafina Mitchell, MD Inpatient   04/27/2017 2111 04/30/2017 1628 Full Code [id number]  Oswald Hillock, MD Inpatient   12/14/2016 1954 12/19/2016 1754 Full Code [id number]  Reubin Milan, MD ED   09/23/2014 0336 09/24/2014 1740 Full Code [id number]  Ivor Costa, MD Inpatient   09/20/2014 1426 09/20/2014 2131 Full Code [id number]  Serafina Mitchell, MD Inpatient      Home/SNF/Other Home  Chief Complaint Post-Op Recheck  Level of Care/Admitting Diagnosis ED Disposition    ED Disposition Condition [place]: [place] [100100]  Level of Care: Telemetry [5]  Diagnosis: Seroma due to trauma [facility]) [111552]  Admitting Physician: Darlin Drop  Attending Physician: Elam Dutch [phone]  Estimated length of stay: 3 - 4 days  Certification:: I certify this patient will need inpatient services for at least 2 midnights  PT Class (Do Not Modify): Inpatient [101]  PT Acc Code (Do Not Modify): Private [1]       Medical History Past Medical History:  Diagnosis Date  . Anxiety   . Arthritis    knees , back , shoulders  . CAD (coronary artery disease)    Mild nonobstructive disease at cardiac catheterization 2002  . Chronic back pain   . CKD (chronic kidney disease), stage II   . COPD (chronic obstructive pulmonary disease)  ([place])   . Diverticulitis   . Esophageal reflux   . Essential hypertension   . Hepatitis C    states he no longer has this  . History of kidney stones   . History of syncope   . Hyperlipidemia   . Nephrolithiasis   . PAT (paroxysmal atrial tachycardia) ([place])   . Peripheral arterial disease (HCC)    Occluded left superficial femoral artery status post stent June 2016 - Dr. Trula Slade  . Pneumonia    as child   17 years old  . Polycystic kidney, unspecified type   . Syncope 09/2014    Allergies No Known Allergies  IV Location/Drains/Wounds Patient Lines/Drains/Airways Status   Active Line/Drains/Airways    Name:   Placement date:   Placement time:   Site:   Days:   Peripheral IV 09/18/17 Right Forearm   09/18/17    2229    Forearm   1   Hemodialysis Catheter Right Internal jugular Temporary   08/31/17    1525    Internal jugular   19   Hemodialysis Catheter Right Internal jugular Double-lumen;Permanent   09/04/17    1412    Internal jugular   15   Incision (Closed) 08/14/17 Groin Right   08/14/17    1145     36   Incision (Closed) 09/04/17 Chest Right  09/04/17    1428     15   Incision (Closed) 09/04/17 Arm Left   09/04/17    1428     15          Labs/Imaging Results for orders placed or performed during the hospital encounter of 09/18/17 (from the past 48 hour(s))  CBC with Differential     Status: Abnormal   Collection Time: 09/18/17 10:25 PM  Result Value Ref [place]   WBC 6.7 4.0 - 10.5 K/uL   RBC 3.54 (L) 4.22 - 5.81 MIL/uL   Hemoglobin 11.6 (L) 13.0 - 17.0 g/dL   HCT 36.2 (L) 39.0 - 52.0 %   MCV 102.3 (H) 78.0 - 100.0 fL   MCH 32.8 26.0 - 34.0 pg   MCHC 32.0 30.0 - 36.0 g/dL   RDW 14.9 11.5 - 15.5 %   Platelets 229 150 - 400 K/uL   Neutrophils Relative % 72 %   Neutro Abs 4.8 1.7 - 7.7 K/uL   Lymphocytes Relative 15 %   Lymphs Abs 1.0 0.7 - 4.0 K/uL   Monocytes Relative 10 %   Monocytes Absolute 0.7 0.1 - 1.0 K/uL   Eosinophils Relative 2 %   Eosinophils  Absolute 0.2 0.0 - 0.7 K/uL   Basophils Relative 1 %   Basophils Absolute 0.1 0.0 - 0.1 K/uL    Comment: Performed at [facility], [address] [place], [place] [address]  Basic metabolic panel     Status: Abnormal   Collection Time: 09/18/17 10:25 PM  Result Value Ref [place]   Sodium 135 135 - 145 mmol/L   Potassium 3.1 (L) 3.5 - 5.1 mmol/L   Chloride 92 (L) 101 - 111 mmol/L   CO2 29 22 - 32 mmol/L   Glucose, Bld 100 (H) 65 - 99 mg/dL   BUN 26 (H) 6 - 20 mg/dL   Creatinine, Ser 4.42 (H) 0.61 - 1.24 mg/dL   Calcium 8.4 (L) 8.9 - 10.3 mg/dL   GFR calc non Af Amer 13 (L) >60 mL/min   GFR calc Af Amer 15 (L) >60 mL/min    Comment: (NOTE) The eGFR has been calculated using the CKD EPI equation. This calculation has not been validated in all clinical situations. eGFR's persistently <60 mL/min signify possible Chronic Kidney Disease.    Anion gap 14 5 - 15    Comment: Performed at [facility], [address], [place], [place] [address]  Protime-INR     Status: Abnormal   Collection Time: 09/18/17 10:25 PM  Result Value Ref [place]   Prothrombin Time 20.2 (H) 11.4 - 15.2 seconds   INR 1.74     Comment: Performed at [facility], [address] [place], [place] [address]  Type and screen     Status: None   Collection Time: 09/18/17 10:25 PM  Result Value Ref [place]   ABO/RH(D) O POS    Antibody Screen NEG    Sample Expiration      09/21/2017 Performed at [facility], [address], [place], [place] [address]    No results found.  Pending Labs Unresulted Labs (From admission, onward)   Start     Ordered   09/19/17 1100  Heparin level (unfractionated)  Once-Timed,   R     09/19/17 0228   09/19/17 3888  Basic metabolic panel  Once,   R     09/19/17 0157   09/19/17 0500  CBC  Tomorrow morning,   R  09/19/17 0157   09/19/17 0500  Protime-INR  Daily,   R     09/19/17 0157   09/19/17 0155  Urinalysis, Routine w reflex microscopic  Once,   R     09/19/17 0157       Vitals/Pain Today's Vitals   09/19/17 0100 09/19/17 0130 09/19/17 0200 09/19/17 0230  BP: (!) 150/79 (!) 152/77 134/63 122/62  Pulse: 63 62 61 (!) 57  Resp:      Temp:      TempSrc:      SpO2: 96% 96% 94% 90%  Weight:      Height:      PainSc:        Isolation Precautions No active isolations  Medications Medications  ondansetron (ZOFRAN) injection 4 mg (has no administration in time range)  pantoprazole (PROTONIX) EC tablet 40 mg (has no administration in time range)  labetalol (NORMODYNE,TRANDATE) injection 10 mg (has no administration in time range)  hydrALAZINE (APRESOLINE) injection 5 mg (has no administration in time range)  metoprolol tartrate (LOPRESSOR) injection 2-5 mg (has no administration in time range)  guaiFENesin-dextromethorphan (ROBITUSSIN DM) 100-10 MG/5ML syrup 15 mL (has no administration in time range)  phenol (CHLORASEPTIC) mouth spray 1 spray (has no administration in time range)  sodium chloride flush (NS) 0.9 % injection 3 mL (3 mLs Intravenous Given 09/19/17 0212)  sodium chloride flush (NS) 0.9 % injection 3 mL (has no administration in time range)  0.9 %  sodium chloride infusion (has no administration in time range)  acetaminophen (TYLENOL) tablet 325-650 mg (has no administration in time range)    Or  acetaminophen (TYLENOL) suppository 325-650 mg (has no administration in time range)  oxyCODONE (Oxy IR/ROXICODONE) immediate release tablet 5-10 mg (has no administration in time range)  morphine 4 MG/ML injection 2-5 mg (has no administration in time range)  docusate sodium (COLACE) capsule 100 mg (has no administration in time range)  fluticasone furoate-vilanterol (BREO ELLIPTA) 200-25 MCG/INH 1 puff (has no administration in time range)  calcitRIOL (ROCALTROL) capsule 0.25 mcg (has no administration in time range)  carvedilol (COREG) tablet 25 mg (has no administration in time range)  cholecalciferol (VITAMIN D) tablet 2,000 Units (has no  administration in time range)  clopidogrel (PLAVIX) tablet 75 mg (has no administration in time range)  heparin ADULT infusion 100 units/mL (25000 units/224m sodium chloride 0.45%) (has no administration in time range)  fentaNYL (SUBLIMAZE) injection 50 mcg (50 mcg Intravenous Given 09/18/17 2311)  morphine 4 MG/ML injection 4 mg (4 mg Intravenous Given 09/19/17 0212)    Mobility walks

## 2017-09-19 NOTE — Progress Notes (Signed)
[place] for Heparin (holding warfarin) Indication: atrial fibrillation  No Known Allergies  Patient Measurements: Height: 6\' 4"  (193 cm) Weight: 197 lb 6.4 oz (89.5 kg) IBW/kg (Calculated) : 86.8  Vital Signs: Temp: 97.8 F (36.6 C) (06/08 1223) Temp Source: Oral (06/08 1223) BP: 136/74 (06/08 1223) Pulse Rate: 66 (06/08 1223)  Labs: Recent Labs    09/18/17 2225 09/19/17 0540 09/19/17 1042  HGB 11.6* 10.8*  --   HCT 36.2* 33.3*  --   PLT 229 199  --   LABPROT 20.2* 21.0*  --   INR 1.74 1.83  --   HEPARINUNFRC  --   --  0.23*  CREATININE 4.42* 4.66*  --     Estimated Creatinine Clearance: 20.4 mL/min (A) (by C-G formula based on SCr of 4.66 mg/dL (H)).   Medical History: Past Medical History:  Diagnosis Date  . Anxiety   . Arthritis    knees , back , shoulders  . CAD (coronary artery disease)    Mild nonobstructive disease at cardiac catheterization 2002  . Chronic back pain   . CKD (chronic kidney disease), stage II   . COPD (chronic obstructive pulmonary disease) ([place])   . Diverticulitis   . Esophageal reflux   . Essential hypertension   . Hepatitis C    states he no longer has this  . History of kidney stones   . History of syncope   . Hyperlipidemia   . Nephrolithiasis   . PAT (paroxysmal atrial tachycardia) ([place])   . Peripheral arterial disease (HCC)    Occluded left superficial femoral artery status post stent June 2016 - Dr. Trula Slade  . Pneumonia    as child   70 years old  . Polycystic kidney, unspecified type   . Syncope 09/2014    Assessment: 62 y/o M on warfarin PTA for afib, now with increasing seroma of the right leg, holding warfarin and heparin has started. -Initial heparin level is 0.23, hg= 10.8, INR= 1.83  Goal of Therapy:  Heparin level goal 0.3-0.5 due to seroma Monitor platelets by anticoagulation protocol: Yes   Plan:  -Increase heparin to 1400 units/hr -Heparin level in 6 hours and  daily wth CBC daily  Hildred Laser, PharmD Clinical Pharmacist Clinical phone from 8:30-4:00 is [phone] After 4pm, please call Main Rx [phone]) for assistance. 09/19/2017 12:32 PM

## 2017-09-19 NOTE — Progress Notes (Signed)
Initial Nutrition Assessment  DOCUMENTATION CODES:   Not applicable  INTERVENTION:   Nepro Shake po BID, each supplement provides 425 kcal and 19 grams protein  Rena-vite daily  NUTRITION DIAGNOSIS:   Increased nutrient needs related to chronic illness(ESRD on HD, COPD) as evidenced by increased estimated needs.  GOAL:   Patient will meet greater than or equal to 90% of their needs  MONITOR:   PO intake, Supplement acceptance, Labs, Weight trends, Skin, I & O's  REASON FOR ASSESSMENT:   Malnutrition Screening Tool    ASSESSMENT:   history of CAD, ESRD on hemodialysis, COPD, PAD status post stenting and endarterectomy of his right lower extremity on 08-14-2017. Pt presents for right groin swelling    Met with pt in room today. Pt reports decreased appetite and oral intake for the past 1 month. Pt reports that today his appetite is good; pt eating 100% of meals. Pt does not drink supplements at home but is willing to try them here. Pt does not take any vitamins at home except for vitamin D. Pt gets calcitriol during dialysis. Pt reports a 48lbs wt loss; states he weighed 248lbs last fall. Per chart, pt 199lbs in 06/2016, 194lbs 12/2016; it appears pt has been weight stable for the past year. Pt reports HD initiated ~3 weeks ago. RD educated pt today on the importance of adequate protein and vitamin intake needed with HD. Recommended protein supplements and rena-vite daily. Pt reports easy bruising and is observed to have bruising on his bilateral arms. Pt with hypokalemia; repletion as needed per MD.   Medications reviewed and include: calcitriol, vitamin D, plavix, colace, protonix, heparin, oxycodone   Labs reviewed: K 3.0(L), Cl 96(L), BUN 26(H), creat 4.66(H), Ca 8.0(L) P 4.9(H)- 5/25 Hgb 10.8(L), Hct 33.3(L) iPTH- 269(H)- 5/20  NUTRITION - FOCUSED PHYSICAL EXAM:    Most Recent Value  Orbital Region  No depletion  Upper Arm Region  Mild depletion  Thoracic and Lumbar  Region  Mild depletion  Buccal Region  No depletion  Temple Region  No depletion  Clavicle Bone Region  No depletion  Clavicle and Acromion Bone Region  No depletion  Scapular Bone Region  No depletion  Dorsal Hand  No depletion  Patellar Region  Severe depletion  Anterior Thigh Region  Moderate depletion  Posterior Calf Region  Moderate depletion  Edema (RD Assessment)  Mild  Hair  Reviewed  Eyes  Reviewed  Mouth  Reviewed  Skin  Reviewed  Nails  Reviewed     Diet Order:   Diet Order           Diet renal with fluid restriction Fluid restriction: 1200 mL Fluid; Room service appropriate? Yes; Fluid consistency: Thin  Diet effective now         EDUCATION NEEDS:   Education needs have been addressed  Skin:  Skin Assessment: Reviewed RN Assessment(incisions chest, arms, and groin )  Last BM:  6/7  Height:   Ht Readings from Last 1 Encounters:  09/19/17 '6\' 4"'  (1.93 m)    Weight:   Wt Readings from Last 1 Encounters:  09/19/17 197 lb 6.4 oz (89.5 kg)    Ideal Body Weight:  91.8 kg  BMI:  Body mass index is 24.03 kg/m.  Estimated Nutritional Needs:   Kcal:  2300-2600kcal/day   Protein:  116-134g/day   Fluid:  UOP +1L  Koleen Distance MS, RD, LDN Pager #- [phone] Office#- [phone] After Hours Pager: [phone]

## 2017-09-19 NOTE — Plan of Care (Signed)
Care plans reviewed and patient is progressing.  

## 2017-09-19 NOTE — Progress Notes (Signed)
HD treatment rescheduled  To  09/20/2017 by Dr. Jonnie Finner. Orders modified. Primary RN Coralyn Mark notified of change

## 2017-09-19 NOTE — Progress Notes (Addendum)
  Progress Note    09/19/2017 10:38 AM * No surgery found *  Subjective:  No new complaints this morning.  Persistent pain to R groin seroma "like a tooth ache"   Vitals:   09/19/17 0554 09/19/17 1023  BP:  132/81  Pulse: 70 70  Resp: 19 20  Temp:    SpO2: 95% 94%   Physical Exam: Lungs:  Non labored Incisions:  R groin incision healed; distal groin incision with seroma; palpable R DP Extremities:  Palpable thrill and audible bruit L brachiobasilic fistula; small seroma L AC fossa; palpable L radial Abdomen:  Soft Neurologic: A&O  CBC    Component Value Date/Time   WBC 5.3 09/19/2017 0540   RBC 3.31 (L) 09/19/2017 0540   HGB 10.8 (L) 09/19/2017 0540   HCT 33.3 (L) 09/19/2017 0540   PLT 199 09/19/2017 0540   MCV 100.6 (H) 09/19/2017 0540   MCH 32.6 09/19/2017 0540   MCHC 32.4 09/19/2017 0540   RDW 14.5 09/19/2017 0540   LYMPHSABS 1.0 09/18/2017 2225   MONOABS 0.7 09/18/2017 2225   EOSABS 0.2 09/18/2017 2225   BASOSABS 0.1 09/18/2017 2225    BMET    Component Value Date/Time   NA 136 09/19/2017 0540   K 3.0 (L) 09/19/2017 0540   CL 96 (L) 09/19/2017 0540   CO2 29 09/19/2017 0540   GLUCOSE 100 (H) 09/19/2017 0540   BUN 26 (H) 09/19/2017 0540   CREATININE 4.66 (H) 09/19/2017 0540   CALCIUM 8.0 (L) 09/19/2017 0540   GFRNONAA 12 (L) 09/19/2017 0540   GFRAA 14 (L) 09/19/2017 0540    INR    Component Value Date/Time   INR 1.83 09/19/2017 0540     Intake/Output Summary (Last 24 hours) at 09/19/2017 1038 Last data filed at 09/19/2017 0604 Gross per 24 hour  Intake 120 ml  Output -  Net 120 ml     Assessment/Plan:  62 y.o. male with seroma R groin surgical site s/p R iliac and SFA stent with femoral endarterectomy 08/14/17  Holding coumadin; pharmacy dosing IV heparin Perfusing BLE based on physical exam Likely I&D R groin on Monday Will also consult Nephrology for management of ESRD on HD   Iline Oven [facility] [phone] 09/19/2017 10:38 AM  Agree with above.  Will need HD today his usual day Hold warfarin.  Transition to heparin.  I and D right groin Monday  Ruta Hinds, MD [facility] [place] Office: [phone] Pager: [phone]

## 2017-09-20 LAB — PROTIME-INR
INR: 1.57
Prothrombin Time: 18.6 seconds — ABNORMAL HIGH (ref 11.4–15.2)

## 2017-09-20 LAB — CBC
HCT: 33 % — ABNORMAL LOW (ref 39.0–52.0)
Hemoglobin: 10.7 g/dL — ABNORMAL LOW (ref 13.0–17.0)
MCH: 33.2 pg (ref 26.0–34.0)
MCHC: 32.4 g/dL (ref 30.0–36.0)
MCV: 102.5 fL — AB (ref 78.0–100.0)
PLATELETS: 184 10*3/uL (ref 150–400)
RBC: 3.22 MIL/uL — ABNORMAL LOW (ref 4.22–5.81)
RDW: 14.3 % (ref 11.5–15.5)
WBC: 5 10*3/uL (ref 4.0–10.5)

## 2017-09-20 LAB — HEPARIN LEVEL (UNFRACTIONATED): Heparin Unfractionated: 0.41 IU/mL (ref 0.30–0.70)

## 2017-09-20 LAB — GLUCOSE, CAPILLARY: GLUCOSE-CAPILLARY: 88 mg/dL (ref 65–99)

## 2017-09-20 MED ORDER — CEFAZOLIN SODIUM-DEXTROSE 2-4 GM/100ML-% IV SOLN
2.0000 g | INTRAVENOUS | Status: AC
  Filled 2017-09-20: qty 100

## 2017-09-20 MED ORDER — CEFAZOLIN SODIUM-DEXTROSE 1-4 GM/50ML-% IV SOLN
1.0000 g | INTRAVENOUS | Status: DC
Start: 2017-09-21 — End: 2017-09-20

## 2017-09-20 MED ORDER — CALCITRIOL 0.25 MCG PO CAPS
ORAL_CAPSULE | ORAL | Status: AC
  Filled 2017-09-20: qty 1

## 2017-09-20 NOTE — H&P (View-Only) (Signed)
  Progress Note    09/20/2017 9:07 AM * No surgery date entered *  Subjective:  No complaints; scheduled for dialysis today   Vitals:   09/20/17 0532 09/20/17 0632  BP: (!) 145/71   Pulse: (!) 53   Resp: 16   Temp: 97.8 F (36.6 C)   SpO2: 100% 100%   Physical Exam: Lungs:  Non labored Incisions:  R groin incision with fluctuant fluid collection; palpable R DP Extremities:  Perfusing BLE cap refill normal; palpable thrill L arm Abdomen:  Soft Neurologic: A&O  CBC    Component Value Date/Time   WBC 5.0 09/20/2017 0209   RBC 3.22 (L) 09/20/2017 0209   HGB 10.7 (L) 09/20/2017 0209   HCT 33.0 (L) 09/20/2017 0209   PLT 184 09/20/2017 0209   MCV 102.5 (H) 09/20/2017 0209   MCH 33.2 09/20/2017 0209   MCHC 32.4 09/20/2017 0209   RDW 14.3 09/20/2017 0209   LYMPHSABS 1.0 09/18/2017 2225   MONOABS 0.7 09/18/2017 2225   EOSABS 0.2 09/18/2017 2225   BASOSABS 0.1 09/18/2017 2225    BMET    Component Value Date/Time   NA 136 09/19/2017 0540   K 3.0 (L) 09/19/2017 0540   CL 96 (L) 09/19/2017 0540   CO2 29 09/19/2017 0540   GLUCOSE 100 (H) 09/19/2017 0540   BUN 26 (H) 09/19/2017 0540   CREATININE 4.66 (H) 09/19/2017 0540   CALCIUM 8.0 (L) 09/19/2017 0540   GFRNONAA 12 (L) 09/19/2017 0540   GFRAA 14 (L) 09/19/2017 0540    INR    Component Value Date/Time   INR 1.57 09/20/2017 0209     Intake/Output Summary (Last 24 hours) at 09/20/2017 0907 Last data filed at 09/19/2017 1722 Gross per 24 hour  Intake 410.4 ml  Output 550 ml  Net -139.6 ml     Assessment/Plan:  62 y.o. male with seroma R groin surgical site s/p R iliac and SFA stent with femoral endarterectomy 08/14/17  Holding coumadin; pharmacy dosing IV heparin Plan is for I&D R groin tomorrow NPO past midnight ESRD on HD per Nephrology   [name], PA-C [facility] [phone] 09/20/2017 9:07 AM  Agree with above I and D groin in OR tomorrow NPO p midnight D/c heparin on  call to [place], MD [facility] [place] Office: [phone] Pager: [phone]

## 2017-09-20 NOTE — Progress Notes (Signed)
Patient left the unit for dialysis

## 2017-09-20 NOTE — Progress Notes (Signed)
Aaron [name] for Heparin (holding warfarin) Indication: atrial fibrillation  No Known Allergies  Patient Measurements: Height: 6\' 4"  (193 cm) Weight: 199 lb 12.8 oz (90.6 kg) IBW/kg (Calculated) : 86.8  Vital Signs: Temp: 97.8 F (36.6 C) (06/09 0532) Temp Source: Oral (06/09 0532) BP: 145/71 (06/09 0532) Pulse Rate: 53 (06/09 0532)  Labs: Recent Labs    09/18/17 2225 09/19/17 0540 09/19/17 1042 09/19/17 2137 09/20/17 0209  HGB 11.6* 10.8*  --   --  10.7*  HCT 36.2* 33.3*  --   --  33.0*  PLT 229 199  --   --  184  LABPROT 20.2* 21.0*  --   --  18.6*  INR 1.74 1.83  --   --  1.57  HEPARINUNFRC  --   --  0.23* 0.45 0.41  CREATININE 4.42* 4.66*  --   --   --     Estimated Creatinine Clearance: 20.4 mL/min (A) (by C-G formula based on SCr of 4.66 mg/dL (H)).   Assessment: 62 y/o M on warfarin PTA for afib, now with increasing seroma of the right leg, holding warfarin and heparin has started. Planned I/D on right groin tomorrow morning.   Heparin level this morning therapeutic at 0.4. Hemoglobin low, stable and platelets wnl. Noted no new concerns with bleeding or heparin drip overnight.   Goal of Therapy:  Heparin level goal 0.3-0.5 due to seroma Monitor platelets by anticoagulation protocol: Yes   Plan:  Continue heparin at 1400 units/hr Daily HL/ CBC  Monitor for S/Sx of bleeding  Follow for warfarin re-start after procedure this week   Jalene Mullet, Pharm.D. PGY1 Pharmacy Resident 09/20/2017 7:22 AM Phone: [phone]

## 2017-09-20 NOTE — Progress Notes (Signed)
[facility] Progress Note  Subjective: no c/o, didn't get HD due to staffing and emergencies  Vitals:   09/19/17 1223 09/19/17 2034 09/20/17 0532 09/20/17 0632  BP: 136/74 (!) 164/79 (!) 145/71   Pulse: 66 (!) 55 (!) 53   Resp: 18 18 16    Temp: 97.8 F (36.6 C) 97.7 F (36.5 C) 97.8 F (36.6 C)   TempSrc: Oral Oral Oral   SpO2: 98% 98% 100% 100%  Weight:   90.6 kg (199 lb 12.8 oz)   Height:        Inpatient medications: . calcitRIOL  0.25 mcg Oral Q T,Th,Sa-HD  . calcium acetate  667 mg Oral TID WC  . carvedilol  25 mg Oral BID WC  . Chlorhexidine Gluconate Cloth  6 each Topical Q0600  . cholecalciferol  2,000 Units Oral Daily  . clopidogrel  75 mg Oral Daily  . docusate sodium  100 mg Oral BID  . feeding supplement (NEPRO CARB STEADY)  237 mL Oral BID BM  . fluticasone furoate-vilanterol  1 puff Inhalation Daily  . multivitamin  1 tablet Oral QHS  . pantoprazole  40 mg Oral Daily  . sodium chloride flush  3 mL Intravenous Q12H   . sodium chloride    . [START ON 09/21/2017]  ceFAZolin (ANCEF) IV    . heparin 1,400 Units/hr (09/19/17 2023)   sodium chloride, acetaminophen **OR** acetaminophen, guaiFENesin-dextromethorphan, hydrALAZINE, labetalol, metoprolol tartrate, morphine injection, ondansetron, oxyCODONE, phenol, sodium chloride flush  Exam:  gen wdwn no distress up in chair  no jvd  chest cta bilat  cor reg no mrg  abd soft ntnd no ascites  ext no leg edema  R IJ TDC/ L forearm AVF+bruit  Dialysis: [facility] [place]  4h  91kg 2/2.5 bath Hep 2700  R IJ tdc/ L AVF maturing  - calcitriol 0.25 ug tiw      Impression: 1. R groin seroma - For I &D Monday   2. ESRD -  TTS HD. Missed HD here yest, plan HD this afternoon.  3. Hypertension/volume  - BP ok on coreg, no gross vol excess but under dry 4. Anemia  - Hgb 10.8. No ESA needs currently.  5. Metabolic bone disease -  Continue calcitriol/Ca acetate binder  6. Nutrition - Renal diet/vitamins   7. AFib - Holding Coumadin. Heparin per pharmacy   8. PAD  S/p right iliac stent right femoral endarterectomy and right SFA stenting 5/3   Plan - as above   Kelly Splinter MD [facility] pager [phone]   09/20/2017, 10:44 AM   Recent Labs  Lab 09/18/17 2225 09/19/17 0540  NA 135 136  K 3.1* 3.0*  CL 92* 96*  CO2 29 29  GLUCOSE 100* 100*  BUN 26* 26*  CREATININE 4.42* 4.66*  CALCIUM 8.4* 8.0*   No results for input(s): AST, ALT, ALKPHOS, BILITOT, PROT, ALBUMIN in the last 168 hours. Recent Labs  Lab 09/18/17 2225 09/19/17 0540 09/20/17 0209  WBC 6.7 5.3 5.0  NEUTROABS 4.8  --   --   HGB 11.6* 10.8* 10.7*  HCT 36.2* 33.3* 33.0*  MCV 102.3* 100.6* 102.5*  PLT 229 199 184   Iron/TIBC/Ferritin/ %Sat    Component Value Date/Time   IRON 45 08/31/2017 0427   TIBC 232 (L) 08/31/2017 0427   IRONPCTSAT 19 08/31/2017 0427

## 2017-09-20 NOTE — Progress Notes (Addendum)
  Progress Note    09/20/2017 9:07 AM * No surgery date entered *  Subjective:  No complaints; scheduled for dialysis today   Vitals:   09/20/17 0532 09/20/17 0632  BP: (!) 145/71   Pulse: (!) 53   Resp: 16   Temp: 97.8 F (36.6 C)   SpO2: 100% 100%   Physical Exam: Lungs:  Non labored Incisions:  R groin incision with fluctuant fluid collection; palpable R DP Extremities:  Perfusing BLE cap refill normal; palpable thrill L arm Abdomen:  Soft Neurologic: A&O  CBC    Component Value Date/Time   WBC 5.0 09/20/2017 0209   RBC 3.22 (L) 09/20/2017 0209   HGB 10.7 (L) 09/20/2017 0209   HCT 33.0 (L) 09/20/2017 0209   PLT 184 09/20/2017 0209   MCV 102.5 (H) 09/20/2017 0209   MCH 33.2 09/20/2017 0209   MCHC 32.4 09/20/2017 0209   RDW 14.3 09/20/2017 0209   LYMPHSABS 1.0 09/18/2017 2225   MONOABS 0.7 09/18/2017 2225   EOSABS 0.2 09/18/2017 2225   BASOSABS 0.1 09/18/2017 2225    BMET    Component Value Date/Time   NA 136 09/19/2017 0540   K 3.0 (L) 09/19/2017 0540   CL 96 (L) 09/19/2017 0540   CO2 29 09/19/2017 0540   GLUCOSE 100 (H) 09/19/2017 0540   BUN 26 (H) 09/19/2017 0540   CREATININE 4.66 (H) 09/19/2017 0540   CALCIUM 8.0 (L) 09/19/2017 0540   GFRNONAA 12 (L) 09/19/2017 0540   GFRAA 14 (L) 09/19/2017 0540    INR    Component Value Date/Time   INR 1.57 09/20/2017 0209     Intake/Output Summary (Last 24 hours) at 09/20/2017 0907 Last data filed at 09/19/2017 1722 Gross per 24 hour  Intake 410.4 ml  Output 550 ml  Net -139.6 ml     Assessment/Plan:  62 y.o. male with seroma R groin surgical site s/p R iliac and SFA stent with femoral endarterectomy 08/14/17  Holding coumadin; pharmacy dosing IV heparin Plan is for I&D R groin tomorrow NPO past midnight ESRD on HD per Nephrology   [name], PA-C [facility] [phone] 09/20/2017 9:07 AM  Agree with above I and D groin in OR tomorrow NPO p midnight D/c heparin on  call to [place], MD [facility] [place] Office: [phone] Pager: [phone]

## 2017-09-21 ENCOUNTER — Inpatient Hospital Stay (HOSPITAL_COMMUNITY): Payer: 59 | Admitting: Anesthesiology

## 2017-09-21 ENCOUNTER — Encounter (HOSPITAL_COMMUNITY)
Admission: EM | Disposition: A | Discharge status: Home Health | Payer: Self-pay | Source: Home / Self Care | Attending: Vascular Surgery

## 2017-09-21 DIAGNOSIS — I9789 Other postprocedural complications and disorders of the circulatory system, not elsewhere classified: Secondary | ICD-10-CM

## 2017-09-21 HISTORY — PX: I & D EXTREMITY: SHX5045

## 2017-09-21 LAB — PROTIME-INR
INR: 1.21
PROTHROMBIN TIME: 15.2 s (ref 11.4–15.2)

## 2017-09-21 LAB — CBC
HEMATOCRIT: 32.9 % — AB (ref 39.0–52.0)
Hemoglobin: 10.7 g/dL — ABNORMAL LOW (ref 13.0–17.0)
MCH: 32.8 pg (ref 26.0–34.0)
MCHC: 32.5 g/dL (ref 30.0–36.0)
MCV: 100.9 fL — ABNORMAL HIGH (ref 78.0–100.0)
PLATELETS: 201 10*3/uL (ref 150–400)
RBC: 3.26 MIL/uL — ABNORMAL LOW (ref 4.22–5.81)
RDW: 14.3 % (ref 11.5–15.5)
WBC: 4.7 10*3/uL (ref 4.0–10.5)

## 2017-09-21 LAB — POCT I-STAT 4, (NA,K, GLUC, HGB,HCT)
GLUCOSE: 97 mg/dL (ref 65–99)
HCT: 32 % — ABNORMAL LOW (ref 39.0–52.0)
Hemoglobin: 10.9 g/dL — ABNORMAL LOW (ref 13.0–17.0)
Potassium: 4.2 mmol/L (ref 3.5–5.1)
Sodium: 135 mmol/L (ref 135–145)

## 2017-09-21 LAB — HEPARIN LEVEL (UNFRACTIONATED): Heparin Unfractionated: 0.53 IU/mL (ref 0.30–0.70)

## 2017-09-21 SURGERY — IRRIGATION AND DEBRIDEMENT EXTREMITY
Anesthesia: General | Site: Groin | Laterality: Right

## 2017-09-21 MED ORDER — LIDOCAINE HCL (CARDIAC) PF 100 MG/5ML IV SOSY
PREFILLED_SYRINGE | INTRAVENOUS | Status: DC | PRN
  Administered 2017-09-21: 60 mg via INTRAVENOUS

## 2017-09-21 MED ORDER — PHENYLEPHRINE HCL 10 MG/ML IJ SOLN
INTRAVENOUS | Status: DC | PRN
  Administered 2017-09-21: 60 ug/min via INTRAVENOUS

## 2017-09-21 MED ORDER — PROPOFOL 10 MG/ML IV BOLUS
INTRAVENOUS | Status: DC | PRN
  Administered 2017-09-21: 100 mg via INTRAVENOUS

## 2017-09-21 MED ORDER — ONDANSETRON HCL 4 MG/2ML IJ SOLN
INTRAMUSCULAR | Status: AC
  Filled 2017-09-21: qty 2

## 2017-09-21 MED ORDER — LIDOCAINE 2% (20 MG/ML) 5 ML SYRINGE
INTRAMUSCULAR | Status: AC
  Filled 2017-09-21: qty 5

## 2017-09-21 MED ORDER — CHLORHEXIDINE GLUCONATE CLOTH 2 % EX PADS
6.0000 | MEDICATED_PAD | Freq: Every day | CUTANEOUS | Status: DC
  Administered 2017-09-22 – 2017-09-23 (×2): 6 via TOPICAL

## 2017-09-21 MED ORDER — PROPOFOL 10 MG/ML IV BOLUS
INTRAVENOUS | Status: AC
  Filled 2017-09-21: qty 20

## 2017-09-21 MED ORDER — 0.9 % SODIUM CHLORIDE (POUR BTL) OPTIME
TOPICAL | Status: DC | PRN
  Administered 2017-09-21: 1000 mL

## 2017-09-21 MED ORDER — MIDAZOLAM HCL 2 MG/2ML IJ SOLN
INTRAMUSCULAR | Status: AC
  Filled 2017-09-21: qty 2

## 2017-09-21 MED ORDER — DEXAMETHASONE SODIUM PHOSPHATE 10 MG/ML IJ SOLN
INTRAMUSCULAR | Status: AC
  Filled 2017-09-21: qty 1

## 2017-09-21 MED ORDER — EPHEDRINE SULFATE 50 MG/ML IJ SOLN
INTRAMUSCULAR | Status: DC | PRN
  Administered 2017-09-21 (×2): 10 mg via INTRAVENOUS

## 2017-09-21 MED ORDER — FENTANYL CITRATE (PF) 100 MCG/2ML IJ SOLN: 25.0000 ug | INTRAMUSCULAR | Status: DC | PRN

## 2017-09-21 MED ORDER — GLYCOPYRROLATE 0.2 MG/ML IJ SOLN
INTRAMUSCULAR | Status: DC | PRN
  Administered 2017-09-21: 0.3 mg via INTRAVENOUS

## 2017-09-21 MED ORDER — BACITRACIN ZINC 500 UNIT/GM EX OINT
TOPICAL_OINTMENT | Freq: Two times a day (BID) | CUTANEOUS | Status: DC
  Administered 2017-09-22 – 2017-09-23 (×2): via TOPICAL
  Filled 2017-09-21: qty 28.4

## 2017-09-21 MED ORDER — MIDAZOLAM HCL 5 MG/5ML IJ SOLN
INTRAMUSCULAR | Status: DC | PRN
  Administered 2017-09-21 (×2): 1 mg via INTRAVENOUS

## 2017-09-21 MED ORDER — FENTANYL CITRATE (PF) 250 MCG/5ML IJ SOLN
INTRAMUSCULAR | Status: AC
  Filled 2017-09-21: qty 5

## 2017-09-21 MED ORDER — OXYCODONE HCL 5 MG PO TABS
ORAL_TABLET | ORAL | Status: AC
  Filled 2017-09-21: qty 2

## 2017-09-21 MED ORDER — ONDANSETRON HCL 4 MG/2ML IJ SOLN
INTRAMUSCULAR | Status: DC | PRN
Start: 2017-09-21 — End: 2017-09-21
  Administered 2017-09-21: 4 mg via INTRAVENOUS

## 2017-09-21 MED ORDER — ONDANSETRON HCL 4 MG/2ML IJ SOLN: 4.0000 mg | Freq: Once | INTRAMUSCULAR | Status: DC | PRN

## 2017-09-21 MED ORDER — FENTANYL CITRATE (PF) 100 MCG/2ML IJ SOLN
INTRAMUSCULAR | Status: DC | PRN
  Administered 2017-09-21 (×3): 50 ug via INTRAVENOUS

## 2017-09-21 SURGICAL SUPPLY — 34 items
BANDAGE ACE 4X5 VEL STRL LF (GAUZE/BANDAGES/DRESSINGS) IMPLANT
BANDAGE ACE 6X5 VEL STRL LF (GAUZE/BANDAGES/DRESSINGS) IMPLANT
BNDG GAUZE ELAST 4 BULKY (GAUZE/BANDAGES/DRESSINGS) IMPLANT
CANISTER SUCT 3000ML PPV (MISCELLANEOUS) ×2 IMPLANT
COVER SURGICAL LIGHT HANDLE (MISCELLANEOUS) ×2 IMPLANT
DRAPE INCISE IOBAN 66X45 STRL (DRAPES) ×1 IMPLANT
DRSG VAC ATS SM SENSATRAC (GAUZE/BANDAGES/DRESSINGS) ×1 IMPLANT
ELECT REM PT RETURN 9FT ADLT (ELECTROSURGICAL) ×2
ELECTRODE REM PT RTRN 9FT ADLT (ELECTROSURGICAL) ×1 IMPLANT
GAUZE SPONGE 4X4 12PLY STRL (GAUZE/BANDAGES/DRESSINGS) ×2 IMPLANT
GLOVE BIO SURGEON STRL SZ7.5 (GLOVE) ×2 IMPLANT
GLOVE BIOGEL PI IND STRL 7.0 (GLOVE) IMPLANT
GLOVE BIOGEL PI IND STRL 7.5 (GLOVE) IMPLANT
GLOVE BIOGEL PI INDICATOR 7.0 (GLOVE) ×1
GLOVE BIOGEL PI INDICATOR 7.5 (GLOVE) ×1
GOWN STRL REUS W/ TWL LRG LVL3 (GOWN DISPOSABLE) ×3 IMPLANT
GOWN STRL REUS W/TWL LRG LVL3 (GOWN DISPOSABLE) ×6
KIT BASIN OR (CUSTOM PROCEDURE TRAY) ×2 IMPLANT
KIT DRAINAGE VACCUM ASSIST (KITS) ×1 IMPLANT
KIT TURNOVER KIT B (KITS) ×2 IMPLANT
NS IRRIG 1000ML POUR BTL (IV SOLUTION) ×2 IMPLANT
PACK GENERAL/GYN (CUSTOM PROCEDURE TRAY) IMPLANT
PACK UNIVERSAL I (CUSTOM PROCEDURE TRAY) IMPLANT
PAD ARMBOARD 7.5X6 YLW CONV (MISCELLANEOUS) ×4 IMPLANT
STAPLER VISISTAT 35W (STAPLE) IMPLANT
SUT ETHILON 3 0 PS 1 (SUTURE) IMPLANT
SUT VIC AB 2-0 CTX 36 (SUTURE) IMPLANT
SUT VIC AB 3-0 SH 27 (SUTURE) ×6
SUT VIC AB 3-0 SH 27X BRD (SUTURE) IMPLANT
SUT VICRYL 4-0 PS2 18IN ABS (SUTURE) IMPLANT
SWAB CULTURE LIQ STUART DBL (MISCELLANEOUS) ×1 IMPLANT
SWAB CULTURE LIQUID MINI MALE (MISCELLANEOUS) IMPLANT
TOWEL GREEN STERILE (TOWEL DISPOSABLE) ×2 IMPLANT
WATER STERILE IRR 1000ML POUR (IV SOLUTION) ×2 IMPLANT

## 2017-09-21 NOTE — Anesthesia Preprocedure Evaluation (Signed)
Anesthesia Evaluation  Patient identified by MRN, date of birth, ID band Patient awake    Reviewed: Allergy & Precautions, NPO status , Patient's Chart, lab work & pertinent test results  Airway Mallampati: II  TM Distance: >3 FB Neck ROM: Full    Dental  (+) Edentulous Upper, Edentulous Lower   Pulmonary COPD, former smoker,    Pulmonary exam normal        Cardiovascular hypertension, Pt. on home beta blockers + CAD and + Peripheral Vascular Disease  Normal cardiovascular exam+ dysrhythmias Atrial Fibrillation  Rhythm:Regular Rate:Normal  Echo 08/25/17 Left ventricle: The cavity size was normal. There was moderate  concentric hypertrophy. Systolic function was normal. The  estimated ejection fraction was in the range of 55% to 60%. Wall  motion was normal; there were no regional wall motion  abnormalities. Left ventricular diastolic function parameters  were normal.   Neuro/Psych Anxiety Depression negative neurological ROS     GI/Hepatic GERD  Medicated and Controlled,(+) Hepatitis -, C  Endo/Other  negative endocrine ROS  Renal/GU CRFRenal disease     Musculoskeletal  (+) Arthritis , Osteoarthritis,    Abdominal   Peds  Hematology  (+) anemia ,   Anesthesia Other Findings   Reproductive/Obstetrics                             Lab Results  Component Value Date   CREATININE 4.66 (H) 09/19/2017   BUN 26 (H) 09/19/2017   NA 136 09/19/2017   K 3.0 (L) 09/19/2017   CL 96 (L) 09/19/2017   CO2 29 09/19/2017    Lab Results  Component Value Date   WBC 4.7 09/21/2017   HGB 10.7 (L) 09/21/2017   HCT 32.9 (L) 09/21/2017   MCV 100.9 (H) 09/21/2017   PLT 201 09/21/2017    Anesthesia Physical  Anesthesia Plan  ASA: III  Anesthesia Plan: General   Post-op Pain Management:    Induction: Intravenous  PONV Risk Score and Plan: 2 and Treatment may vary due to age or medical condition,  Ondansetron and Dexamethasone  Airway Management Planned: LMA  Additional Equipment:   Intra-op Plan:   Post-operative Plan: Extubation in OR  Informed Consent: I have reviewed the patients History and Physical, chart, labs and discussed the procedure including the risks, benefits and alternatives for the proposed anesthesia with the patient or authorized representative who has indicated his/her understanding and acceptance.   Dental advisory given  Plan Discussed with: CRNA, Anesthesiologist and Surgeon  Anesthesia Plan Comments:         Anesthesia Quick Evaluation

## 2017-09-21 NOTE — Transfer of Care (Addendum)
Immediate Anesthesia Transfer of Care Note  Patient: [name]  Procedure(s) Performed: IRRIGATION AND DEBRIDEMENT GROIN (Right Groin)  Patient Location: PACU  Anesthesia Type:General  Level of Consciousness: awake, alert , oriented and patient cooperative  Airway & Oxygen Therapy: Patient Spontanous Breathing and Patient connected to nasal cannula oxygen  Post-op Assessment: Report given to RN, Post -op Vital signs reviewed and stable, Patient moving all extremities and Patient moving all extremities X 4  Post vital signs: Reviewed and stable  Last Vitals:  Vitals Value Taken Time  BP 142/83 09/21/2017 12:16 PM  Temp 36.3 C 09/21/2017 12:16 PM  Pulse 77 09/21/2017 12:20 PM  Resp 19 09/21/2017 12:20 PM  SpO2 95 % 09/21/2017 12:20 PM  Vitals shown include unvalidated device data.  Last Pain:  Vitals:   09/21/17 1216  TempSrc:   PainSc: 5          Complications: No apparent anesthesia complications

## 2017-09-21 NOTE — Anesthesia Postprocedure Evaluation (Signed)
Anesthesia Post Note  Patient: [name]  Procedure(s) Performed: IRRIGATION AND DEBRIDEMENT GROIN (Right Groin)     Patient location during evaluation: PACU Anesthesia Type: General Level of consciousness: awake and alert Pain management: pain level controlled Vital Signs Assessment: post-procedure vital signs reviewed and stable Respiratory status: spontaneous breathing, nonlabored ventilation, respiratory function stable and patient connected to nasal cannula oxygen Cardiovascular status: blood pressure returned to baseline and stable Postop Assessment: no apparent nausea or vomiting Anesthetic complications: no    Last Vitals:  Vitals:   09/21/17 1216 09/21/17 1233  BP: (!) 142/83 133/77  Pulse: 73 82  Resp: 15 16  Temp: (!) 36.3 C (!) 36.3 C  SpO2: 99% 93%    Last Pain:  Vitals:   09/21/17 1233  TempSrc:   PainSc: 4     LLE Motor Response: Purposeful movement (09/21/17 1233) LLE Sensation: Full sensation (09/21/17 1233) RLE Motor Response: Purposeful movement (09/21/17 1233) RLE Sensation: Full sensation (09/21/17 1233)      Tiajuana Amass

## 2017-09-21 NOTE — Consult Note (Signed)
Urology Consult  Referring physician: Dr. Oneida Alar Reason for referral: paraphimosis  Chief Complaint: penile wound  History of Present Illness: [name] is a 62yo with a hx of CKD, CAD, PVD who underwent seoma drainage and was noted to have a paraphimosis and wound on his penis. The patient denies penile pain. The wound has been present for several weeks and he states he believes he had a paraphomosis after multiple foley catheterizations. He states after his last foley was placed the foreskin swelled and then slit. He denies any LUTS.   Past Medical History:  Diagnosis Date  . Anxiety   . Arthritis    knees , back , shoulders  . CAD (coronary artery disease)    Mild nonobstructive disease at cardiac catheterization 2002  . Chronic back pain   . CKD (chronic kidney disease), stage II   . COPD (chronic obstructive pulmonary disease) ([place])   . Diverticulitis   . Esophageal reflux   . Essential hypertension   . Hepatitis C    states he no longer has this  . History of kidney stones   . History of syncope   . Hyperlipidemia   . Nephrolithiasis   . PAT (paroxysmal atrial tachycardia) ([place])   . Peripheral arterial disease (HCC)    Occluded left superficial femoral artery status post stent June 2016 - Dr. Trula Slade  . Pneumonia    as child   40 years old  . Polycystic kidney, unspecified type   . Syncope 09/2014   Past Surgical History:  Procedure Laterality Date  . ABDOMINAL AORTOGRAM N/A 08/11/2017   Procedure: ABDOMINAL AORTOGRAM;  Surgeon: Serafina Mitchell, MD;  Location: [facility];  Service: Cardiovascular;  Laterality: N/A;  . APPLICATION OF WOUND VAC Right 08/14/2017   Procedure: APPLICATION OF PREVENA INCISIONAL WOUND VAC RIGHT GROIN;  Surgeon: Serafina Mitchell, MD;  [facility];  Service: Vascular;  Laterality: Right;  . AV FISTULA PLACEMENT Left 09/04/2017   Procedure: ARTERIOVENOUS (AV) FISTULA CREATION LEFT UPPER ARM;  Surgeon: Serafina Mitchell, MD;  Location: [facility];   Service: Vascular;  Laterality: Left;  . BACK SURGERY    . BIOPSY  12/17/2016   Procedure: BIOPSY;  Surgeon: Daneil Dolin, MD;  [facility];  Service: Gastroenterology;;  gastric colon  . BIOPSY  04/29/2017   Procedure: BIOPSY;  Surgeon: Daneil Dolin, MD;  [facility];  Service: Endoscopy;;  duodenal biopsies  . BUNIONECTOMY    . CERVICAL SPINE SURGERY    . COLONOSCOPY  2008   Dr. Oneida Alar: rare sigmoid colon diverticulosis, internal hemorrhoids.   . COLONOSCOPY WITH PROPOFOL N/A 12/17/2016   dense left-sided diverticulosis, right colon ulcers s/p biopsy query occult NSAID use vs transient ischemia, not consistent with IBD. CMV stains negative.   Marland Kitchen ENDARTERECTOMY FEMORAL Right 08/14/2017   Procedure: RIGHT ILLIO-FEMORAL ENDARTERECTOMY;  Surgeon: Serafina Mitchell, MD;  Location: [facility];  Service: Vascular;  Laterality: Right;  . ESOPHAGEAL DILATION  12/17/2016   EGD with mild Schatzki's ring s/p dilatation, small hiatal hernia, erosive gastropathy (negative H.pylori gastritis)  . ESOPHAGOGASTRODUODENOSCOPY  2008   Dr. Oneida Alar: normal esophagus without Barrett's, antritis and duodenitis, path with H.pylori gastritis  . ESOPHAGOGASTRODUODENOSCOPY (EGD) WITH PROPOFOL N/A 12/17/2016   Procedure: ESOPHAGOGASTRODUODENOSCOPY (EGD) WITH PROPOFOL;  Surgeon: Daneil Dolin, MD;  [facility];  Service: Gastroenterology;  Laterality: N/A;  . ESOPHAGOGASTRODUODENOSCOPY (EGD) WITH PROPOFOL N/A 04/29/2017   Patchy erythema of gastric  mucosa diffusely, extensive inflammatory changes in duodenum, geographic ulceration and mucosal edema present, encroaching somewhat on the lumen yet still widely patent, distal second portion of duodenum appeared abnormal, path with peptic duodenitis with ulceration  . HERNIA REPAIR     umbilical  . INSERTION OF DIALYSIS CATHETER Right 09/04/2017   Procedure: INSERTION OF TUNNELED  DIALYSIS CATHETER - RIGHT INTERNAL JUGULAR PLACEMENT;  Surgeon:  Serafina Mitchell, MD;  Location: [facility];  Service: Vascular;  Laterality: Right;  . INSERTION OF ILIAC STENT Right 08/14/2017   Procedure: INSERTION OF RIGHT COMMON ILIAC STENT 36m x 869mx 130cm INSERTION OF RIGHT EXTERNAL ILIAC STENT 72m70m 77m22m130cm INSERTION OF SUPERFICIAL FERMORAL ARTERY STENT 7mm 64m0mm 74m0cm;  Surgeon: BrabhaSerafina Mitchell [facility];  Laterality: Right;  . IR FLUORO GUIDE CV LINE RIGHT  08/31/2017  . IR US GUI[place] VASC ACCESS RIGHT  08/31/2017  . laparoscopic inguinal hernia right  02/2017   [place]  . LOWER EXTREMITY ANGIOGRAPHY Right 08/11/2017   Procedure: LOWER EXTREMITY ANGIOGRAPHY;  Surgeon: BrabhaSerafina Mitchell [facility];  Service: Cardiovascular;  Laterality: Right;  . PATCH ANGIOPLASTY Right 08/14/2017   Procedure: PATCH ANGIOPLASTY USING HEMASHIELD PATCH 0.3IN X 6IN;Lillie Columbiageon: BrabhaSerafina Mitchell Location[facility];  Service: Vascular;  Laterality: Right;  . PERIPHERAL VASCULAR CATHETERIZATION N/A 09/20/2014   Procedure: Abdominal Aortogram;  Surgeon: Vance Serafina Mitchell [facility];  Service: Cardiovascular;  Laterality: N/A;    Medications: I have reviewed the patient's current medications. Allergies: No Known Allergies  Family History  Problem Relation Age of Onset  . Alcoholism Mother   . Heart disease Father        Massive heart attack  . Heart attack Father   . Atrial fibrillation Father   . Colon cancer Father   . Colon cancer Maternal Grandfather 60  . 69coholism Maternal Grandfather   . Renal cancer Cousin   . Ovarian cancer Sister    Social History:  reports that he quit smoking about 7 weeks ago. His smoking use included cigarettes. He has a 23.50 pack-year smoking history. He has quit using smokeless tobacco. His smokeless tobacco use included snuff and chew. He reports that he has current or past drug history. He reports that he does not drink alcohol.  Review of Systems  All  other systems reviewed and are negative.   Physical Exam:  Vital signs in last 24 hours: Temp:  [97.3 F (36.3 C)-98.4 F (36.9 C)] 97.3 F (36.3 C) (06/10 1233) Pulse Rate:  [52-82] 82 (06/10 1233) Resp:  [15-20] 16 (06/10 1233) BP: (115-180)/(69-91) 133/77 (06/10 1233) SpO2:  [93 %-100 %] 93 % (06/10 1233) Weight:  [89.2 kg (196 lb 11.2 oz)-91.6 kg (201 lb 15.1 oz)] 89.2 kg (196 lb 11.2 oz) (06/10 0526) Physical Exam  Constitutional: He is oriented to person, place, and time. He appears well-developed and well-nourished.  HENT:  Head: Normocephalic and atraumatic.  Eyes: Pupils are equal, round, and reactive to light. EOM are normal.  Neck: Normal range of motion. No thyromegaly present.  Cardiovascular: Normal rate and regular rhythm.  Respiratory: Effort normal. No respiratory distress.  GI: Soft. He exhibits no distension. Hernia confirmed negative in the right inguinal area and confirmed negative in the left inguinal area.  Genitourinary: Testes normal. Uncircumcised.  Genitourinary Comments: Dorsal wound 3cm in length subcoronal  Musculoskeletal: Normal  range of motion. He exhibits no edema.  Lymphadenopathy:       Right: No inguinal adenopathy present.       Left: No inguinal adenopathy present.  Neurological: He is alert and oriented to person, place, and time.  Skin: Skin is warm and dry.  Psychiatric: He has a normal mood and affect. His behavior is normal. Judgment and thought content normal.    Laboratory Data:  Results for orders placed or performed during the hospital encounter of 09/18/17 (from the past 72 hour(s))  CBC with Differential     Status: Abnormal   Collection Time: 09/18/17 10:25 PM  Result Value Ref Range   WBC 6.7 4.0 - 10.5 K/uL   RBC 3.54 (L) 4.22 - 5.81 MIL/uL   Hemoglobin 11.6 (L) 13.0 - 17.0 g/dL   HCT 36.2 (L) 39.0 - 52.0 %   MCV 102.3 (H) 78.0 - 100.0 fL   MCH 32.8 26.0 - 34.0 pg   MCHC 32.0 30.0 - 36.0 g/dL   RDW 14.9 11.5 - 15.5 %    Platelets 229 150 - 400 K/uL   Neutrophils Relative % 72 %   Neutro Abs 4.8 1.7 - 7.7 K/uL   Lymphocytes Relative 15 %   Lymphs Abs 1.0 0.7 - 4.0 K/uL   Monocytes Relative 10 %   Monocytes Absolute 0.7 0.1 - 1.0 K/uL   Eosinophils Relative 2 %   Eosinophils Absolute 0.2 0.0 - 0.7 K/uL   Basophils Relative 1 %   Basophils Absolute 0.1 0.0 - 0.1 K/uL    Comment: Performed at [facility], [address] [place], [place] [address]  Basic metabolic panel     Status: Abnormal   Collection Time: 09/18/17 10:25 PM  Result Value Ref Range   Sodium 135 135 - 145 mmol/L   Potassium 3.1 (L) 3.5 - 5.1 mmol/L   Chloride 92 (L) 101 - 111 mmol/L   CO2 29 22 - 32 mmol/L   Glucose, Bld 100 (H) 65 - 99 mg/dL   BUN 26 (H) 6 - 20 mg/dL   Creatinine, Ser 4.42 (H) 0.61 - 1.24 mg/dL   Calcium 8.4 (L) 8.9 - 10.3 mg/dL   GFR calc non Af Amer 13 (L) >60 mL/min   GFR calc Af Amer 15 (L) >60 mL/min    Comment: (NOTE) The eGFR has been calculated using the CKD EPI equation. This calculation has not been validated in all clinical situations. eGFR's persistently <60 mL/min signify possible Chronic Kidney Disease.    Anion gap 14 5 - 15    Comment: Performed at [facility], [address], [place], [place] [address]  Protime-INR     Status: Abnormal   Collection Time: 09/18/17 10:25 PM  Result Value Ref Range   Prothrombin Time 20.2 (H) 11.4 - 15.2 seconds   INR 1.74     Comment: Performed at [facility], [address], [place], [place] [address]  Type and screen     Status: None   Collection Time: 09/18/17 10:25 PM  Result Value Ref Range   ABO/RH(D) O POS    Antibody Screen NEG    Sample Expiration      09/21/2017 Performed at [facility], [address] [place], [place] [address]   CBC     Status: Abnormal   Collection Time: 09/19/17  5:40 AM  Result Value Ref Range   WBC 5.3 4.0 - 10.5 K/uL   RBC 3.31 (L) 4.22 - 5.81 MIL/uL  Hemoglobin 10.8 (L) 13.0 - 17.0 g/dL   HCT 33.3 (L) 39.0 -  52.0 %   MCV 100.6 (H) 78.0 - 100.0 fL   MCH 32.6 26.0 - 34.0 pg   MCHC 32.4 30.0 - 36.0 g/dL   RDW 14.5 11.5 - 15.5 %   Platelets 199 150 - 400 K/uL    Comment: Performed at [facility] [address], [place], [place] [address]  Basic metabolic panel     Status: Abnormal   Collection Time: 09/19/17  5:40 AM  Result Value Ref Range   Sodium 136 135 - 145 mmol/L   Potassium 3.0 (L) 3.5 - 5.1 mmol/L   Chloride 96 (L) 101 - 111 mmol/L   CO2 29 22 - 32 mmol/L   Glucose, Bld 100 (H) 65 - 99 mg/dL   BUN 26 (H) 6 - 20 mg/dL   Creatinine, Ser 4.66 (H) 0.61 - 1.24 mg/dL   Calcium 8.0 (L) 8.9 - 10.3 mg/dL   GFR calc non Af Amer 12 (L) >60 mL/min   GFR calc Af Amer 14 (L) >60 mL/min    Comment: (NOTE) The eGFR has been calculated using the CKD EPI equation. This calculation has not been validated in all clinical situations. eGFR's persistently <60 mL/min signify possible Chronic Kidney Disease.    Anion gap 11 5 - 15    Comment: Performed at [facility] [address], [place], [place] [address]  Protime-INR     Status: Abnormal   Collection Time: 09/19/17  5:40 AM  Result Value Ref Range   Prothrombin Time 21.0 (H) 11.4 - 15.2 seconds   INR 1.83     Comment: Performed at [facility] [address] [place], [place] [address]  MRSA PCR Screening     Status: None   Collection Time: 09/19/17  5:58 AM  Result Value Ref Range   MRSA by PCR NEGATIVE NEGATIVE    Comment:        The GeneXpert MRSA Assay (FDA approved for NASAL specimens only), is one component of a comprehensive MRSA colonization surveillance program. It is not intended to diagnose MRSA infection nor to guide or monitor treatment for MRSA infections. Performed at [facility], [place] [address], [place],  [address]   Urinalysis, Routine w reflex microscopic     Status: Abnormal   Collection Time: 09/19/17  7:01 AM  Result Value Ref Range   Color, Urine YELLOW YELLOW   APPearance  CLEAR CLEAR   Specific Gravity, Urine 1.008 1.005 - 1.030   pH 6.0 5.0 - 8.0   Glucose, UA NEGATIVE NEGATIVE mg/dL   Hgb urine dipstick SMALL (A) NEGATIVE   Bilirubin Urine NEGATIVE NEGATIVE   Ketones, ur NEGATIVE NEGATIVE mg/dL   Protein, ur 30 (A) NEGATIVE mg/dL   Nitrite NEGATIVE NEGATIVE   Leukocytes, UA NEGATIVE NEGATIVE   RBC / HPF 0-5 0 - 5 RBC/hpf   WBC, UA 0-5 0 - 5 WBC/hpf   Bacteria, UA NONE SEEN NONE SEEN   Squamous Epithelial / LPF 0-5 0 - 5    Comment: Performed at [facility], [place] [address] [place], [place] [address]  Heparin level (unfractionated)     Status: Abnormal   Collection Time: 09/19/17 10:42 AM  Result Value Ref Range   Heparin Unfractionated 0.23 (L) 0.30 - 0.70 IU/mL    Comment: (NOTE) If heparin results are below expected values, and patient dosage has  been  confirmed, suggest follow up testing of antithrombin III levels. Performed at [facility], [place] [address], [place], [place] [address]   Heparin level (unfractionated)     Status: None   Collection Time: 09/19/17  9:37 PM  Result Value Ref Range   Heparin Unfractionated 0.45 0.30 - 0.70 IU/mL    Comment: (NOTE) If heparin results are below expected values, and patient dosage has  been confirmed, suggest follow up testing of antithrombin III levels. Performed at [facility], [place] [address], [place], [place] [address]   Protime-INR     Status: Abnormal   Collection Time: 09/20/17  2:09 AM  Result Value Ref Range   Prothrombin Time 18.6 (H) 11.4 - 15.2 seconds   INR 1.57     Comment: Performed at [facility] [address] [place], [place] [address]  CBC     Status: Abnormal   Collection Time: 09/20/17  2:09 AM  Result Value Ref Range   WBC 5.0 4.0 - 10.5 K/uL   RBC 3.22 (L) 4.22 - 5.81 MIL/uL   Hemoglobin 10.7 (L) 13.0 - 17.0 g/dL   HCT 33.0 (L) 39.0 - 52.0 %   MCV 102.5 (H) 78.0 - 100.0 fL   MCH 33.2 26.0 - 34.0 pg   MCHC 32.4 30.0 - 36.0 g/dL   RDW 14.3  11.5 - 15.5 %   Platelets 184 150 - 400 K/uL    Comment: Performed at [facility], [place] [address], [place], [place] [address]  Heparin level (unfractionated)     Status: None   Collection Time: 09/20/17  2:09 AM  Result Value Ref Range   Heparin Unfractionated 0.41 0.30 - 0.70 IU/mL    Comment: (NOTE) If heparin results are below expected values, and patient dosage has  been confirmed, suggest follow up testing of antithrombin III levels. Performed at [facility], [place] [address], [place], [place] [address]   Glucose, capillary     Status: None   Collection Time: 09/20/17  8:18 PM  Result Value Ref Range   Glucose-Capillary 88 65 - 99 mg/dL  Protime-INR     Status: None   Collection Time: 09/21/17  3:08 AM  Result Value Ref Range   Prothrombin Time 15.2 11.4 - 15.2 seconds   INR 1.21     Comment: Performed at [facility], [place] [address] [place], [place] [address]  Heparin level (unfractionated)     Status: None   Collection Time: 09/21/17  3:08 AM  Result Value Ref Range   Heparin Unfractionated 0.53 0.30 - 0.70 IU/mL    Comment: (NOTE) If heparin results are below expected values, and patient dosage has  been confirmed, suggest follow up testing of antithrombin III levels. Performed at [facility], [place] [address], [place], [place] [address]   CBC     Status: Abnormal   Collection Time: 09/21/17  3:08 AM  Result Value Ref Range   WBC 4.7 4.0 - 10.5 K/uL   RBC 3.26 (L) 4.22 - 5.81 MIL/uL   Hemoglobin 10.7 (L) 13.0 - 17.0 g/dL   HCT 32.9 (L) 39.0 - 52.0 %   MCV 100.9 (H) 78.0 - 100.0 fL   MCH 32.8 26.0 - 34.0 pg   MCHC 32.5 30.0 - 36.0 g/dL   RDW 14.3 11.5 - 15.5 %   Platelets 201 150 - 400 K/uL    Comment: Performed at [facility], [address] [place], [place] [address]  I-STAT 4, (NA,K, GLUC, HGB,HCT)     Status: Abnormal   Collection Time: 09/21/17 10:39 AM  Result Value Ref Range   Sodium 135 135 - 145 mmol/L   Potassium 4.2  3.5 - 5.1 mmol/L   Glucose, Bld 97 65 - 99 mg/dL   HCT 32.0 (L) 39.0 - 52.0 %   Hemoglobin 10.9 (L) 13.0 - 17.0 g/dL  Aerobic Culture (superficial specimen)     Status: None (Preliminary result)   Collection Time: 09/21/17 12:00 PM  Result Value Ref Range   Specimen Description ABSCESS    Special Requests NONE    Gram Stain      RARE WBC PRESENT, PREDOMINANTLY MONONUCLEAR NO ORGANISMS SEEN Performed at [facility], [place] [address], [place], [place] [address]    Culture PENDING    Report Status PENDING    Recent Results (from the past 240 hour(s))  MRSA PCR Screening     Status: None   Collection Time: 09/19/17  5:58 AM  Result Value Ref Range Status   MRSA by PCR NEGATIVE NEGATIVE Final    Comment:        The GeneXpert MRSA Assay (FDA approved for NASAL specimens only), is one component of a comprehensive MRSA colonization surveillance program. It is not intended to diagnose MRSA infection nor to guide or monitor treatment for MRSA infections. Performed at [facility], [place] [address] [place], [place] [address]   Aerobic Culture (superficial specimen)     Status: None (Preliminary result)   Collection Time: 09/21/17 12:00 PM  Result Value Ref Range Status   Specimen Description ABSCESS  Final   Special Requests NONE  Final   Gram Stain   Final    RARE WBC PRESENT, PREDOMINANTLY MONONUCLEAR NO ORGANISMS SEEN Performed at [facility], [place] [address], [place], [place] [address]    Culture PENDING  Incomplete   Report Status PENDING  Incomplete   Creatinine: Recent Labs    09/18/17 2225 09/19/17 0540  CREATININE 4.42* 4.66*   Baseline Creatinine: NA  Impression/Assessment:  62yo with hx of paraphimosis and subsequent penile wound  Plan:  1. The patients paraphimosis resolved and the separation of the skin has left a open wound that is healing poorly. Please place bacitracin BID on the wound which should aid in healing. The patient is to  followup in 2-3 weeks after discharge for a wound check.  Nicolette Bang 09/21/2017, 4:11 PM

## 2017-09-21 NOTE — Anesthesia Procedure Notes (Signed)
Procedure Name: LMA Insertion Date/Time: 09/21/2017 11:23 AM Performed by: Izora Gala, CRNA Pre-anesthesia Checklist: Patient identified, Emergency Drugs available and Suction available Patient Re-evaluated:Patient Re-evaluated prior to induction Oxygen Delivery Method: Circle system utilized Preoxygenation: Pre-oxygenation with 100% oxygen Induction Type: IV induction Ventilation: Mask ventilation without difficulty LMA: LMA inserted LMA Size: 5.0 Placement Confirmation: positive ETCO2 Tube secured with: Tape Dental Injury: Teeth and Oropharynx as per pre-operative assessment

## 2017-09-21 NOTE — Progress Notes (Addendum)
ANTICOAGULATION CONSULT NOTE - Follow Up Consult  Pharmacy Consult for Heparin (Coumadin on hold) Indication: atrial fibrillation  No Known Allergies  Patient Measurements: Height: 6\' 4"  (193 cm) Weight: 196 lb 11.2 oz (89.2 kg) IBW/kg (Calculated) : 86.8 Heparin Dosing Weight: 89 kg  Vital Signs: Temp: 97.8 F (36.6 C) (06/10 0526) Temp Source: Oral (06/10 0526) BP: 157/91 (06/10 0526) Pulse Rate: 56 (06/10 0526)  Labs: Recent Labs    09/18/17 2225 09/19/17 0540  09/19/17 2137 09/20/17 0209 09/21/17 0308  HGB 11.6* 10.8*  --   --  10.7* 10.7*  HCT 36.2* 33.3*  --   --  33.0* 32.9*  PLT 229 199  --   --  184 201  LABPROT 20.2* 21.0*  --   --  18.6* 15.2  INR 1.74 1.83  --   --  1.57 1.21  HEPARINUNFRC  --   --    < > 0.45 0.41 0.53  CREATININE 4.42* 4.66*  --   --   --   --    < > = values in this interval not displayed.   Assessment:  62 yr old male on Coumadin prior to admission for atrial fibrillation.  Coumadin held with increasing seroma of right grown surgical site s/p 5/3 procedure; for I&D today.   Continues on IV heparin.    Heparin level is therapeutic (0.53) on 1400 units/hr.  Just above low therapeutic goal.  INR down to 1.21 today.  CBC stable.  Goal of Therapy:  Heparin level 0.3-0.5 units/ml (lower end of therapeutic range due to seroma) Monitor platelets by anticoagulation protocol: Yes   Plan:   Continue heparin drip at 1400 units/hr  Heparin to be held on call to OR.  Coumadin on hold.  Will follow up post-op for anticoagulation plans.   [name], [place] Pager: [phone] or phone: [phone] 09/21/2017,10:03 AM  Addendum:    Heparin drip resumed post-op ~1pm at pre-op rate of 1400 units/hr   Next labs in am.  Consuello Masse, RPh 09/21/2017 5:50 PM

## 2017-09-21 NOTE — Interval H&P Note (Signed)
History and Physical Interval Note:  09/21/2017 10:47 AM  Aaron [name]  has presented today for surgery, with the diagnosis of infection  The various methods of treatment have been discussed with the patient and family. After consideration of risks, benefits and other options for treatment, the patient has consented to  Procedure(s): IRRIGATION AND DEBRIDEMENT GROIN (N/A) as a surgical intervention .  The patient's history has been reviewed, patient examined, no change in status, stable for surgery.  I have reviewed the patient's chart and labs.  Questions were answered to the patient's satisfaction.     Ruta Hinds

## 2017-09-21 NOTE — Progress Notes (Signed)
[facility] Progress Note  Subjective: had HD yesterday  Vitals:   09/20/17 1930 09/20/17 2037 09/21/17 0526 09/21/17 0812  BP: (!) 144/75 (!) 172/88 (!) 157/91   Pulse: 67 (!) 59 (!) 56   Resp: 18 20    Temp:  97.7 F (36.5 C) 97.8 F (36.6 C)   TempSrc:  Oral Oral   SpO2: 100% 100% 96% 98%  Weight: 90 kg (198 lb 6.6 oz)  89.2 kg (196 lb 11.2 oz)   Height:        Inpatient medications: . calcitRIOL  0.25 mcg Oral Q T,Th,Sa-HD  . calcium acetate  667 mg Oral TID WC  . carvedilol  25 mg Oral BID WC  . Chlorhexidine Gluconate Cloth  6 each Topical Q0600  . cholecalciferol  2,000 Units Oral Daily  . clopidogrel  75 mg Oral Daily  . docusate sodium  100 mg Oral BID  . feeding supplement (NEPRO CARB STEADY)  237 mL Oral BID BM  . fluticasone furoate-vilanterol  1 puff Inhalation Daily  . multivitamin  1 tablet Oral QHS  . pantoprazole  40 mg Oral Daily  . sodium chloride flush  3 mL Intravenous Q12H   . sodium chloride    .  ceFAZolin (ANCEF) IV    . heparin 1,400 Units/hr (09/20/17 1529)   sodium chloride, acetaminophen **OR** acetaminophen, guaiFENesin-dextromethorphan, hydrALAZINE, labetalol, metoprolol tartrate, morphine injection, ondansetron, oxyCODONE, phenol, sodium chloride flush  Exam:  gen wdwn no distress in bed today  no jvd  chest cta bilat  cor reg no mrg  abd soft ntnd no ascites  ext no leg edema  R IJ TDC/ L forearm AVF+bruit    Dialysis: [facility]  4h  91kg 2/2.5 bath Hep 2700  R IJ tdc/ L AVF maturing  - calcitriol 0.25 ug tiw  - home bp Rx: coreg 25 bid     Impression: 1. R groin seroma - For surgery today  2. ESRD - recent start in March. TTS HD. Had HD yesterday in place of Sat. HD tomorrow.  3. Hypertension/volume  - on coreg, no gross vol excess, under dry 4. Anemia  - Hgb 10.8. No ESA needs currently.  5. Metabolic bone disease -  Continue calcitriol/Ca acetate binder  6. Nutrition - Renal diet/vitamins  7. AFib -  Holding Coumadin. Heparin per pharmacy   8. PAD  S/p right iliac stent right femoral endarterectomy and right SFA stenting 5/3   Plan - as above   Kelly Splinter MD [place] pager [phone]   09/21/2017, 8:57 AM   Recent Labs  Lab 09/18/17 2225 09/19/17 0540  NA 135 136  K 3.1* 3.0*  CL 92* 96*  CO2 29 29  GLUCOSE 100* 100*  BUN 26* 26*  CREATININE 4.42* 4.66*  CALCIUM 8.4* 8.0*   No results for input(s): AST, ALT, ALKPHOS, BILITOT, PROT, ALBUMIN in the last 168 hours. Recent Labs  Lab 09/18/17 2225 09/19/17 0540 09/20/17 0209 09/21/17 0308  WBC 6.7 5.3 5.0 4.7  NEUTROABS 4.8  --   --   --   HGB 11.6* 10.8* 10.7* 10.7*  HCT 36.2* 33.3* 33.0* 32.9*  MCV 102.3* 100.6* 102.5* 100.9*  PLT 229 199 184 201   Iron/TIBC/Ferritin/ %Sat    Component Value Date/Time   IRON 45 08/31/2017 0427   TIBC 232 (L) 08/31/2017 0427   IRONPCTSAT 19 08/31/2017 0427

## 2017-09-21 NOTE — Op Note (Addendum)
Procedure: Incision and drainage right groin lymphocele, placement of VAC dressing  Preoperative diagnosis: Right groin lymphocele  Postoperative diagnosis: Same  Anesthesia: General  Operative findings: Lymphocele cavity penetrating all the way down to the level of the bovine pericardial patch.  Approximately 50 cc of fluid evacuated.  Specimens: Culture from right groin lymphocele  Operative details: After obtaining informed consent, the patient was taken the operating.  The patient was placed in supine position operating table.  After induction of general anesthesia the patient's right groin was prepped and draped in usual sterile fashion.  Longitudinal incision was made through a pre-existing scar at the inferior portion of a pre-existing groin incision.  The incision was carried on through the subtendinous tissues down the level of a lymphocele type cavity.  Approximately 50 cc of clear yellow fluid was evacuated.  There was a large lymphocele cavity.  This was thoroughly irrigated with normal saline solution after taking cultures.  I then attempted to close down the cavity as much as possible with running 3-0 Vicryl sutures in several layers.  The skin was left open and a VAC dressing applied.  The patient tired the procedure well there were no complications.  The instrument sponge needle count was correct the end of the case.  The patient was taken to recovery room in stable condition.  Ruta Hinds, MD [facility] [place] Office: [phone] Pager: [phone]

## 2017-09-22 ENCOUNTER — Encounter (HOSPITAL_COMMUNITY): Payer: Self-pay | Admitting: Vascular Surgery

## 2017-09-22 LAB — BASIC METABOLIC PANEL
Anion gap: 10 (ref 5–15)
BUN: 35 mg/dL — ABNORMAL HIGH (ref 6–20)
CO2: 25 mmol/L (ref 22–32)
CREATININE: 4.39 mg/dL — AB (ref 0.61–1.24)
Calcium: 8.5 mg/dL — ABNORMAL LOW (ref 8.9–10.3)
Chloride: 98 mmol/L — ABNORMAL LOW (ref 101–111)
GFR, EST AFRICAN AMERICAN: 15 mL/min — AB (ref >60)
GFR, EST NON AFRICAN AMERICAN: 13 mL/min — AB (ref >60)
Glucose, Bld: 108 mg/dL — ABNORMAL HIGH (ref 65–99)
Potassium: 4.5 mmol/L (ref 3.5–5.1)
Sodium: 133 mmol/L — ABNORMAL LOW (ref 135–145)

## 2017-09-22 LAB — CBC
HEMATOCRIT: 32.8 % — AB (ref 39.0–52.0)
Hemoglobin: 10.5 g/dL — ABNORMAL LOW (ref 13.0–17.0)
MCH: 32.7 pg (ref 26.0–34.0)
MCHC: 32 g/dL (ref 30.0–36.0)
MCV: 102.2 fL — ABNORMAL HIGH (ref 78.0–100.0)
PLATELETS: 191 10*3/uL (ref 150–400)
RBC: 3.21 MIL/uL — ABNORMAL LOW (ref 4.22–5.81)
RDW: 13.9 % (ref 11.5–15.5)
WBC: 9.9 10*3/uL (ref 4.0–10.5)

## 2017-09-22 LAB — HEPARIN LEVEL (UNFRACTIONATED): HEPARIN UNFRACTIONATED: 0.5 [IU]/mL (ref 0.30–0.70)

## 2017-09-22 LAB — PROTIME-INR
INR: 1.05
PROTHROMBIN TIME: 13.6 s (ref 11.4–15.2)

## 2017-09-22 LAB — GLUCOSE, CAPILLARY: Glucose-Capillary: 111 mg/dL — ABNORMAL HIGH (ref 65–99)

## 2017-09-22 MED ORDER — CALCITRIOL 0.25 MCG PO CAPS
ORAL_CAPSULE | ORAL | Status: AC
  Filled 2017-09-22: qty 1

## 2017-09-22 NOTE — Progress Notes (Signed)
[facility] Progress Note  Subjective: on HD now, stable, many questions about dialysis  Vitals:   09/22/17 1130 09/22/17 1200 09/22/17 1204 09/22/17 1234  BP: (!) 142/75 (!) 127/54 (!) 148/72   Pulse: 92 95 88   Resp:   16   Temp:   97.8 F (36.6 C)   TempSrc:   Oral   SpO2:   100% 100%  Weight:   90.9 kg (200 lb 6.4 oz)   Height:        Inpatient medications: . bacitracin   Topical BID  . calcitRIOL  0.25 mcg Oral Q T,Th,Sa-HD  . calcium acetate  667 mg Oral TID WC  . carvedilol  25 mg Oral BID WC  . Chlorhexidine Gluconate Cloth  6 each Topical Q0600  . Chlorhexidine Gluconate Cloth  6 each Topical Q0600  . cholecalciferol  2,000 Units Oral Daily  . clopidogrel  75 mg Oral Daily  . docusate sodium  100 mg Oral BID  . feeding supplement (NEPRO CARB STEADY)  237 mL Oral BID BM  . fluticasone furoate-vilanterol  1 puff Inhalation Daily  . multivitamin  1 tablet Oral QHS  . pantoprazole  40 mg Oral Daily  . sodium chloride flush  3 mL Intravenous Q12H   . sodium chloride 250 mL (09/21/17 1034)  . heparin 1,400 Units/hr (09/22/17 0631)   sodium chloride, acetaminophen **OR** acetaminophen, guaiFENesin-dextromethorphan, hydrALAZINE, labetalol, metoprolol tartrate, morphine injection, ondansetron, oxyCODONE, phenol, sodium chloride flush  Exam:  gen wdwn no distress in bed today  no jvd  chest cta bilat  cor reg no mrg  abd soft ntnd no ascites  ext no leg edema  R IJ TDC/ L forearm AVF+bruit    Dialysis: [facility] [place]  4h  91kg 2/2.5 bath Hep 2700  R IJ tdc/ L AVF maturing  - calcitriol 0.25 ug tiw  - home bp Rx: coreg 25 bid     Impression: 1. SP I&D R groin lymphocele w/ placement of wound VAC - on 6/10  2. ESRD - recent start in March. TTS HD.  3. Hypertension/volume  - on coreg, no gross vol excess, at dry wt 4. Anemia  - Hgb 10.8. No ESA needs currently.  5. Metabolic bone disease -  Continue calcitriol/Ca acetate binder  6. Nutrition -  Renal diet/vitamins  7. AFib - Holding Coumadin. Heparin per pharmacy   8. PAD  S/p right iliac stent right femoral endarterectomy and right SFA stenting 5/3   Plan - HD today, home tomorrow.    Kelly Splinter MD [facility] pager [phone]   09/22/2017, 1:01 PM   Recent Labs  Lab 09/18/17 2225 09/19/17 0540 09/21/17 1039 09/22/17 0813  NA 135 136 135 133*  K 3.1* 3.0* 4.2 4.5  CL 92* 96*  --  98*  CO2 29 29  --  25  GLUCOSE 100* 100* 97 108*  BUN 26* 26*  --  35*  CREATININE 4.42* 4.66*  --  4.39*  CALCIUM 8.4* 8.0*  --  8.5*   No results for input(s): AST, ALT, ALKPHOS, BILITOT, PROT, ALBUMIN in the last 168 hours. Recent Labs  Lab 09/18/17 2225  09/20/17 0209 09/21/17 0308 09/21/17 1039 09/22/17 0441  WBC 6.7   < > 5.0 4.7  --  9.9  NEUTROABS 4.8  --   --   --   --   --   HGB 11.6*   < > 10.7* 10.7* 10.9* 10.5*  HCT 36.2*   < >  33.0* 32.9* 32.0* 32.8*  MCV 102.3*   < > 102.5* 100.9*  --  102.2*  PLT 229   < > 184 201  --  191   < > = values in this interval not displayed.   Iron/TIBC/Ferritin/ %Sat    Component Value Date/Time   IRON 45 08/31/2017 0427   TIBC 232 (L) 08/31/2017 0427   IRONPCTSAT 19 08/31/2017 0427

## 2017-09-22 NOTE — Care Management Note (Signed)
Case Management Note Marvetta Gibbons RN, BSN Unit 4E-Case Manager [phone]  Patient Details  Name: Aaron [name] MRN: [id number] Date of Birth: 1956-02-08  Subjective/Objective:  Pt admitted with R groin hematoma s/p R CFA endart with R iliac stenting 08/14/17- scrotal edema, vascular following                  Action/Plan: PTA pt lived at home- independent, anticipate return home- CM to follow for transition of care needs.   Expected Discharge Date:                  Expected Discharge Plan:  [place]  In-House Referral:  NA  Discharge planning Services  CM Consult  Post Acute Care Choice:  Durable Medical Equipment, Home Health Choice offered to:  Patient  DME Arranged:  Negative pressure wound device DME Agency:  [place]:  RN [facility] Agency:  [place]  Status of Service:  In process, will continue to follow  If discussed at Long Length of Stay Meetings, dates discussed:    Discharge Disposition: home/home health   Additional Comments:  09/22/17- 1600- Rebecca Motta RN, CM- pt s/p I&D with wound VAC placement on 6/10- referral received for home wound vac and HHRN needs- spoke with pt at bedside discussed transition needs - choice offered for [facility] agencies- pt would like to use [facility] - home wound VAC form on shadow chart for signature- M. Collins aware and to sign. - referral called to Butch Penny with [facility] for [facility]- wound VAC drsg changes- pt ESRD with HD on T/T/S. CM will f/u in am for home wound VAC and send in form for approval process- once approved [facility] will deliver to room and place on pt prior to discharge.   Dawayne Patricia, RN 09/22/2017, 4:15 PM

## 2017-09-22 NOTE — Progress Notes (Signed)
[facility] [place]  Subjective  - feels ok   Objective 133/74 (!) 58 97.9 F (36.6 C) (Oral) 13 98%  Intake/Output Summary (Last 24 hours) at 09/22/2017 0757 Last data filed at 09/22/2017 0641 Gross per 24 hour  Intake 1390 ml  Output 5 ml  Net 1385 ml   Right groin VAC in place with good seal  Assessment/Planning: Recheck groin tomorrow D/c home tomorrow if wound looks ok with Aaron [name] 09/22/2017 7:57 AM --  Laboratory Lab Results: Recent Labs    09/21/17 0308 09/21/17 1039 09/22/17 0441  WBC 4.7  --  9.9  HGB 10.7* 10.9* 10.5*  HCT 32.9* 32.0* 32.8*  PLT 201  --  191   BMET Recent Labs    09/21/17 1039  NA 135  K 4.2  GLUCOSE 97    COAG Lab Results  Component Value Date   INR 1.05 09/22/2017   INR 1.21 09/21/2017   INR 1.57 09/20/2017   No results found for: PTT

## 2017-09-22 NOTE — Progress Notes (Addendum)
ANTICOAGULATION CONSULT NOTE - Follow Up Consult  Pharmacy Consult for Heparin (Coumadin on hold) Indication: atrial fibrillation  No Known Allergies  Patient Measurements: Height: 6\' 4"  (193 cm) Weight: 202 lb 6.1 oz (91.8 kg) IBW/kg (Calculated) : 86.8 Heparin Dosing Weight: 89 kg  Vital Signs: Temp: 97.7 F (36.5 C) (06/11 0800) Temp Source: Oral (06/11 0800) BP: 147/68 (06/11 0809) Pulse Rate: 96 (06/11 0809)  Labs: Recent Labs    09/20/17 0209 09/21/17 0308 09/21/17 1039 09/22/17 0441  HGB 10.7* 10.7* 10.9* 10.5*  HCT 33.0* 32.9* 32.0* 32.8*  PLT 184 201  --  191  LABPROT 18.6* 15.2  --  13.6  INR 1.57 1.21  --  1.05  HEPARINUNFRC 0.41 0.53  --  0.50   Assessment:  62 yr old male on Coumadin prior to admission for atrial fibrillation.  Coumadin held with increasing seroma of right grown surgical site s/p 5/3 procedure;s/p I&D.   Continues on IV heparin. Some bleeding noted by RN around the wound vac site.     Heparin level is therapeutic (0.50) on 1400 units/hr.  INR down to 1.05  today.  CBC stable.  Goal of Therapy:  Heparin level 0.3-0.5 units/ml (lower end of therapeutic range due to seroma) Monitor platelets by anticoagulation protocol: Yes   Plan:   Continue heparin drip at 1400 units/hr  Coumadin on hold.  Will follow up for anticoagulation plans.  Danicka Hourihan A. Levada Dy, PharmD, [place] Pager: [phone]  09/22/2017,8:38 AM

## 2017-09-22 NOTE — Progress Notes (Signed)
Patient's wound vac dressing was bleeding a moderate amount from the edge near the inner thigh. Area cleaned and dried and dressing edge reinforced. Ordered necessary supplies to be at bedside for dressing changes.

## 2017-09-23 ENCOUNTER — Telehealth: Payer: Self-pay | Admitting: Surgery

## 2017-09-23 LAB — PROTIME-INR
INR: 1.12
Prothrombin Time: 14.3 seconds (ref 11.4–15.2)

## 2017-09-23 LAB — HEPARIN LEVEL (UNFRACTIONATED): Heparin Unfractionated: 0.81 IU/mL — ABNORMAL HIGH (ref 0.30–0.70)

## 2017-09-23 LAB — CBC
HCT: 32.2 % — ABNORMAL LOW (ref 39.0–52.0)
HEMOGLOBIN: 10.1 g/dL — AB (ref 13.0–17.0)
MCH: 32.4 pg (ref 26.0–34.0)
MCHC: 31.4 g/dL (ref 30.0–36.0)
MCV: 103.2 fL — AB (ref 78.0–100.0)
Platelets: 171 10*3/uL (ref 150–400)
RBC: 3.12 MIL/uL — ABNORMAL LOW (ref 4.22–5.81)
RDW: 14.6 % (ref 11.5–15.5)
WBC: 6.6 10*3/uL (ref 4.0–10.5)

## 2017-09-23 MED ORDER — CLOPIDOGREL BISULFATE 75 MG PO TABS: 75.0000 mg | ORAL_TABLET | Freq: Every day | ORAL | 3 refills | Status: DC

## 2017-09-23 MED ORDER — OXYCODONE-ACETAMINOPHEN 5-325 MG PO TABS: 1.0000 | ORAL_TABLET | Freq: Four times a day (QID) | ORAL | 0 refills | Status: DC | PRN

## 2017-09-23 NOTE — Progress Notes (Signed)
[place] for Heparin Indication: atrial fibrillation  No Known Allergies  Patient Measurements: Height: 6\' 4"  (193 cm) Weight: 200 lb 6.4 oz (90.9 kg) IBW/kg (Calculated) : 86.8 Heparin Dosing Weight: 89 kg  Vital Signs: Temp: 98.8 F (37.1 C) (06/12 0348) Temp Source: Oral (06/12 0348) BP: 152/83 (06/12 0348) Pulse Rate: 98 (06/12 0348)  Labs: Recent Labs    09/21/17 0308 09/21/17 1039 09/22/17 0441 09/22/17 0813 09/23/17 0323  HGB 10.7* 10.9* 10.5*  --  10.1*  HCT 32.9* 32.0* 32.8*  --  32.2*  PLT 201  --  191  --  171  LABPROT 15.2  --  13.6  --  14.3  INR 1.21  --  1.05  --  1.12  HEPARINUNFRC 0.53  --  0.50  --  0.81*  CREATININE  --   --   --  4.39*  --    Assessment: 62 y.o. male with h/o Afib, Coumadin on hold, for heparin  Goal of Therapy:  Heparin level 0.3-0.5 units/ml (lower end of therapeutic range due to seroma) Monitor platelets by anticoagulation protocol: Yes   Plan:   Decrease Heparin 1250 units/hr  Phillis Knack, PharmD, BCPS  09/23/2017,4:42 AM

## 2017-09-23 NOTE — Care Management Note (Signed)
Case Management Note Marvetta Gibbons RN, BSN Unit 4E-Case Manager [phone]  Patient Details  Name: Aaron [name] MRN: [id number] Date of Birth: 1955-11-01  Subjective/Objective:  Pt admitted with R groin hematoma s/p R CFA endart with R iliac stenting 08/14/17- scrotal edema, vascular following                  Action/Plan: PTA pt lived at home- independent, anticipate return home- CM to follow for transition of care needs.   Expected Discharge Date:  09/23/17               Expected Discharge Plan:  [place]  In-House Referral:  NA  Discharge planning Services  CM Consult  Post Acute Care Choice:  Durable Medical Equipment, Home Health Choice offered to:  Patient  DME Arranged:  Negative pressure wound device DME Agency:  [place]:  RN [facility] Agency:  [place]  Status of Service:  Completed, signed off  If discussed at [place] of Stay Meetings, dates discussed:    Discharge Disposition: home/home health   Additional Comments:  09/23/17- 1000- Marvetta Gibbons RN, CM- pt for discharge home today- [facility] home wound VAC form has been signed- Jermaine with [facility] has started approval process- Haematologist with [facility] will place [facility] on pt at the bedside once approved. CM provided pt PCS application with Medicaid per pt request.   09/22/17- 1600- Lillianne Eick RN, CM- pt s/p I&D with wound VAC placement on 6/10- referral received for home wound vac and HHRN needs- spoke with pt at bedside discussed transition needs - choice offered for [facility] agencies- pt would like to use [facility] - home wound VAC form on shadow chart for signature- M. Collins aware and to sign. - referral called to Butch Penny with [facility] for [facility]- wound VAC drsg changes- pt ESRD with HD on T/T/S. CM will f/u in am for home wound VAC and send in form for approval process- once approved [facility] will deliver to room and place on pt prior to discharge.   Dawayne Patricia,  RN 09/23/2017, 10:10 AM

## 2017-09-23 NOTE — Progress Notes (Addendum)
[facility]   Subjective  - Doing well ready to go home.   Objective (!) 152/83 98 98.8 F (37.1 C) (Oral) 15 91%  Intake/Output Summary (Last 24 hours) at 09/23/2017 0737 Last data filed at 09/22/2017 2003 Gross per 24 hour  Intake 720 ml  Output 1400 ml  Net -680 ml   Right groin wound vac  Groin with skin edge bleeding, held pressure.  Redressed wound vac.     Assessment/Planning: POD # 2 Procedure: Incision and drainage right groin lymphocele, placement of VAC     In room wound vac not function well.  Plan for home vac to be applied.  Stable for discharge home today.  RN [place] for vac changes M-W-F.  F/U in our office in 2-3 weeks with Dr. Oneida Alar.  3cm x 1.5 cm x 0.5 cm d.  Roxy Horseman 09/23/2017 7:37 AM --   Agree with above. D/c home today  Ruta Hinds, MD [facility] [place] Office: [phone] Pager: [phone]  Laboratory Lab Results: Recent Labs    09/22/17 0441 09/23/17 0323  WBC 9.9 6.6  HGB 10.5* 10.1*  HCT 32.8* 32.2*  PLT 191 171   BMET Recent Labs    09/21/17 1039 09/22/17 0813  NA 135 133*  K 4.2 4.5  CL  --  98*  CO2  --  25  GLUCOSE 97 108*  BUN  --  35*  CREATININE  --  4.39*  CALCIUM  --  8.5*    COAG Lab Results  Component Value Date   INR 1.12 09/23/2017   INR 1.05 09/22/2017   INR 1.21 09/21/2017   No results found for: PTT

## 2017-09-23 NOTE — Progress Notes (Signed)
Wound dressing changed, wound vac from [facility] applied, supplies delivered to room for home use, iv removed.

## 2017-09-23 NOTE — Telephone Encounter (Signed)
sch appt lvm 10/12/17 2pm wound check

## 2017-09-23 NOTE — Care Management Important Message (Signed)
Important Message  Patient Details  Name: Aaron [name] MRN: [id number] Date of Birth: 1955-05-06   Medicare Important Message Given:  Yes    [name] P Dondre Catalfamo 09/23/2017, 1:07 PM

## 2017-09-23 NOTE — Progress Notes (Signed)
Patient in a stable condition discharged education reviewed with patient at bedside, he verbalized understanding, prescription given to patient, patient belongings at bedside, awaiting [facility] staff to deliver prevena wound vac to the room. Patient to be transported home by his family

## 2017-09-24 LAB — AEROBIC CULTURE  (SUPERFICIAL SPECIMEN)

## 2017-09-24 LAB — AEROBIC CULTURE W GRAM STAIN (SUPERFICIAL SPECIMEN): Culture: NO GROWTH

## 2017-09-28 NOTE — Discharge Summary (Signed)
[facility]ists Discharge Summary   Patient ID:  Aaron [name] MRN: [id number] DOB/AGE: 61-12-1955 61 y.o.  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 Anxiety   . Arthritis    knees , back , shoulders  . CAD (coronary artery disease)    Mild nonobstructive disease at cardiac catheterization 2002  . Chronic back pain   . CKD (chronic kidney disease), stage II   . COPD (chronic obstructive pulmonary disease) ([place])   . Diverticulitis   . Esophageal reflux   . Essential hypertension   . Hepatitis C    states he no longer has this  . History of kidney stones   . History of syncope   . Hyperlipidemia   . Nephrolithiasis   . PAT (paroxysmal atrial tachycardia) ([place])   . Peripheral arterial disease (HCC)    Occluded left superficial femoral artery status post stent June 2016 - Dr. Trula [name]  . Pneumonia    as child   89 years old  . Polycystic kidney, unspecified type   . Syncope 09/2014    Procedure(s): IRRIGATION AND DEBRIDEMENT GROIN  Discharged Condition: stable  HPI: Aaron [name] is a 62 y.o. male s/p right iliac stent right femoral endarterectomy and right SFA stent by Dr Aaron [name] 08/14/17.  The patient developed swelling in his right groin about 3 weeks ago and was seen by Dr Aaron [name].  The hematoma was 2 x 6 cm by CT scan at that time.  This was thought to be a hematoma and managed conservatively.  It has slowly increased in size. He denies any fever or chills.  The patient is on coumadin for afib. After review and exam by  Dr. Oneida [name] on 09/19/2017 plan was for I & D of the right groin.  Hold Coumadin and start Heparin to allow INR to drift down.  ESRD -TTS HD.       Hospital Course:  Aaron [name] is a 62 y.o. male is S/P Right Procedure(s): IRRIGATION AND DEBRIDEMENT GROIN  Consults:  Treatment Team:  Aaron Jaffe, MD   Post op Consult by Dr. Alyson [name] Urology for paraphimosis.  Recommendations: The patients paraphimosis resolved and the separation of the skin has left a open wound that is healing poorly. Please place bacitracin BID on the wound which should aid in healing. The patient is to followup in 2-3 weeks after discharge for a wound check.   Wound vac changed post op day 2.  Home vac placed for discharge home.  [place] set up for vac changes 3 times per week  M-W-F.  F/U in our office in 2-3 weeks with Dr. Oneida [name].  3cm x 1.5 cm x 0.5 cm d.  Stable disposition for discharge.       Significant Diagnostic Studies: CBC Lab Results  Component Value Date   WBC 6.6 09/23/2017   HGB 10.1 (L) 09/23/2017   HCT 32.2 (L) 09/23/2017   MCV 103.2 (H) 09/23/2017   PLT 171 09/23/2017    BMET    Component Value Date/Time   NA 133 (L) 09/22/2017 0813   K 4.5 09/22/2017 0813   CL  98 (L) 09/22/2017 0813   CO2 25 09/22/2017 0813   GLUCOSE 108 (H) 09/22/2017 0813   BUN 35 (H) 09/22/2017 0813   CREATININE 4.39 (H) 09/22/2017 0813   CALCIUM 8.5 (L) 09/22/2017 0813   GFRNONAA 13 (L) 09/22/2017 0813   GFRAA 15 (L) 09/22/2017 0813   COAG Lab Results  Component Value Date   INR 1.12 09/23/2017   INR 1.05 09/22/2017   INR 1.21 09/21/2017     Disposition:  Discharge to :Home Discharge Instructions    Call MD for:  redness, tenderness, or signs of infection (pain, swelling, bleeding, redness, odor or green/yellow discharge around incision site)   Complete by:  As directed    Call MD for:  redness, tenderness, or signs of infection (pain, swelling, bleeding, redness, odor or green/yellow  discharge around incision site)   Complete by:  As directed    Call MD for:  severe or increased pain, loss or decreased feeling  in affected limb(s)   Complete by:  As directed    Call MD for:  severe or increased pain, loss or decreased feeling  in affected limb(s)   Complete by:  As directed    Call MD for:  temperature >100.5   Complete by:  As directed    Call MD for:  temperature >100.5   Complete by:  As directed    Discharge instructions   Complete by:  As directed    Keep wound vac clean and dry.   Resume previous diet   Complete by:  As directed    Resume previous diet   Complete by:  As directed      Allergies as of 09/23/2017   No Known Allergies     Medication [name]    TAKE these medications   BREO ELLIPTA 200-25 MCG/INH Aepb Generic drug:  fluticasone furoate-vilanterol Inhale 1 puff into the lungs daily as needed (wheezing).   calcitRIOL 0.25 MCG capsule Commonly known as:  ROCALTROL Take 1 capsule (0.25 mcg total) by mouth Every Tuesday,Thursday,and Saturday with dialysis.   calcium acetate 667 MG capsule Commonly known as:  PHOSLO Take 667 mg by mouth daily.   carvedilol 25 MG tablet Commonly known as:  COREG Take 1 tablet (25 mg total) 2 (two) times daily by mouth.   cholecalciferol 1000 units tablet Commonly known as:  VITAMIN D Take 2,000 Units by mouth daily.   clopidogrel 75 MG tablet Commonly known as:  PLAVIX Take 1 tablet (75 mg total) by mouth daily.   DIALYVITE 800 0.8 MG Tabs Take 1 tablet by mouth daily.   ferrous sulfate 325 (65 FE) MG tablet Take 325 mg by mouth daily with breakfast.   oxyCODONE-acetaminophen 5-325 MG tablet Commonly known as:  PERCOCET/ROXICET Take 1 tablet by mouth every 6 (six) hours as needed. What changed:    when to take this  reasons to take this   pantoprazole 40 MG tablet Commonly known as:  PROTONIX Take 1 tablet (40 mg total) by mouth 2 (two) times daily.   warfarin 5 MG tablet Commonly  known as:  COUMADIN Take as directed. If you are unsure how to take this medication, talk to your nurse or doctor. Original instructions:  Take 1.5 tablets (7.5 mg total) by mouth daily at 6 PM. What changed:    how much to take  when to take this  additional instructions      Verbal and written Discharge instructions given to the patient. Wound care per Discharge  AVS Follow-up Information    [facility]-Home Follow up.   Specialty:  Home Health Services Why:  [facility] for home wound VAC drsg changes Contact information: [address] [place] [address] [phone]        [facility] - Dme Follow up.   Why:  home wound VAC- to be delivered to room and [facility] RN to place pump on pt prior to discharge.  Contact information: [place] [address] [phone]        Aaron Dutch, MD Follow up in 2 week(s).   Specialties:  Vascular Surgery, Cardiology Why:  office will call Contact information: [address] [place] [address] [phone]           Signed: Roxy Horseman 09/28/2017, 8:49 AM

## 2017-09-29 ENCOUNTER — Encounter (HOSPITAL_COMMUNITY): Payer: Self-pay | Admitting: Surgery

## 2017-10-01 ENCOUNTER — Telehealth: Payer: Self-pay | Admitting: *Deleted

## 2017-10-01 NOTE — Telephone Encounter (Signed)
Please call Aaron [name] in reference to upcoming coumdin checks.

## 2017-10-01 NOTE — Telephone Encounter (Signed)
Spoke with pt.  [facility] nurse told pt she could check his INR at home while she is doing wound care visits.  Pt has been non-compliant in the past.  Has had multiple hospital visits since last INR check and not been back in.  Called [place] RN [facility] and gave order for pt to have INR check tomorrow 10/02/17. Results to be called to the [employer] in my absence.

## 2017-10-02 ENCOUNTER — Ambulatory Visit (INDEPENDENT_AMBULATORY_CARE_PROVIDER_SITE_OTHER): Payer: Medicaid Other | Admitting: Vascular Surgery

## 2017-10-02 ENCOUNTER — Ambulatory Visit: Payer: Self-pay | Admitting: Internal Medicine

## 2017-10-02 ENCOUNTER — Telehealth: Payer: Self-pay | Admitting: Cardiology

## 2017-10-02 ENCOUNTER — Encounter: Payer: Self-pay | Admitting: Vascular Surgery

## 2017-10-02 ENCOUNTER — Telehealth: Payer: Self-pay | Admitting: *Deleted

## 2017-10-02 VITALS — BP 178/90 | HR 71 | Temp 98.1°F | Resp 18 | Ht 76.0 in | Wt 205.0 lb

## 2017-10-02 DIAGNOSIS — Z5181 Encounter for therapeutic drug level monitoring: Secondary | ICD-10-CM

## 2017-10-02 DIAGNOSIS — N185 Chronic kidney disease, stage 5: Secondary | ICD-10-CM

## 2017-10-02 DIAGNOSIS — I739 Peripheral vascular disease, unspecified: Secondary | ICD-10-CM

## 2017-10-02 DIAGNOSIS — I480 Paroxysmal atrial fibrillation: Secondary | ICD-10-CM

## 2017-10-02 LAB — POCT INR: INR: 1 — AB (ref 2.0–3.0)

## 2017-10-02 NOTE — Telephone Encounter (Signed)
Juliann Pulse with [facility] called with INR results  INR 1.0 PT 12.3 patient takes dialysis on Tues, Thursday and Saturday.  Please call [phone]

## 2017-10-02 NOTE — Progress Notes (Signed)
  Subjective:     Patient ID: Aaron [name], male   DOB: [birth year], 62 y.o.   MRN: [id number]  HPI 62 year old male is undergone recent right sided iliac stents and right femoral endarterectomy with right SFA stenting in May by Dr. Trula Slade.  He subsequently had groin debrided and a wound VAC placed.  He now presents with swelling around the right groin.  He does not have any erythema no fevers or chills.  He is currently not on antibiotics but is taking Coumadin for atrial fibrillation.  Home health is seeing him and changes wound VAC this morning.   Review of Systems Right groin swelling    Objective:   Physical Exam Awake alert and oriented Nonlabored respirations Palpable thrill left upper extremity and right IJ tunneled catheter in place His abdomen is soft His right groin has a wound VAC in place and there is a bulge medially  I opened his right groin by probing and removed approximately 200 cc of serous fluid and placed a dry dressing.    Assessment/plan     62 year old male follows up for right groin swelling with a wound VAC in place.  The wound that healed over the cavity this was open today and significant serous fluid was drained and relieved his pain.  Wound VAC will need to be talked down in this corner to keep this open to allow for drainage.  I have written a note to his home health nurse. He will follow-up in a few weeks for wound check.  Brandon C. Donzetta Matters, MD [facility] of [place] Office: [phone] Pager: [phone]

## 2017-10-02 NOTE — Telephone Encounter (Signed)
See anticoag encounter. Spoke with pt and [place]

## 2017-10-02 NOTE — Telephone Encounter (Signed)
Spoke with [facility] nurse and pt, see Anticoagulation encounter.

## 2017-10-02 NOTE — Telephone Encounter (Signed)
INR - 1.0 / PT - 4.3 / please call with instructions / tg

## 2017-10-10 ENCOUNTER — Inpatient Hospital Stay (HOSPITAL_COMMUNITY)
Admission: EM | Admit: 2017-10-10 | Discharge: 2017-10-11 | DRG: 604 | Disposition: A | Discharge status: Home / Self Care | Payer: PRIVATE HEALTH INSURANCE | Attending: Internal Medicine | Admitting: Internal Medicine

## 2017-10-10 ENCOUNTER — Emergency Department (HOSPITAL_COMMUNITY): Payer: PRIVATE HEALTH INSURANCE

## 2017-10-10 ENCOUNTER — Encounter (HOSPITAL_COMMUNITY): Payer: Self-pay | Admitting: *Deleted

## 2017-10-10 ENCOUNTER — Other Ambulatory Visit: Payer: Self-pay

## 2017-10-10 DIAGNOSIS — M479 Spondylosis, unspecified: Secondary | ICD-10-CM | POA: Diagnosis not present

## 2017-10-10 DIAGNOSIS — Z8701 Personal history of pneumonia (recurrent): Secondary | ICD-10-CM

## 2017-10-10 DIAGNOSIS — M19011 Primary osteoarthritis, right shoulder: Secondary | ICD-10-CM | POA: Diagnosis present

## 2017-10-10 DIAGNOSIS — M19012 Primary osteoarthritis, left shoulder: Secondary | ICD-10-CM | POA: Diagnosis present

## 2017-10-10 DIAGNOSIS — Z87442 Personal history of urinary calculi: Secondary | ICD-10-CM

## 2017-10-10 DIAGNOSIS — Z992 Dependence on renal dialysis: Secondary | ICD-10-CM

## 2017-10-10 DIAGNOSIS — N185 Chronic kidney disease, stage 5: Secondary | ICD-10-CM | POA: Diagnosis present

## 2017-10-10 DIAGNOSIS — I2510 Atherosclerotic heart disease of native coronary artery without angina pectoris: Secondary | ICD-10-CM | POA: Diagnosis present

## 2017-10-10 DIAGNOSIS — N186 End stage renal disease: Secondary | ICD-10-CM | POA: Diagnosis present

## 2017-10-10 DIAGNOSIS — I120 Hypertensive chronic kidney disease with stage 5 chronic kidney disease or end stage renal disease: Secondary | ICD-10-CM | POA: Diagnosis present

## 2017-10-10 DIAGNOSIS — Z8051 Family history of malignant neoplasm of kidney: Secondary | ICD-10-CM

## 2017-10-10 DIAGNOSIS — X58XXXA Exposure to other specified factors, initial encounter: Secondary | ICD-10-CM | POA: Diagnosis not present

## 2017-10-10 DIAGNOSIS — Z8249 Family history of ischemic heart disease and other diseases of the circulatory system: Secondary | ICD-10-CM | POA: Diagnosis not present

## 2017-10-10 DIAGNOSIS — I739 Peripheral vascular disease, unspecified: Secondary | ICD-10-CM | POA: Diagnosis not present

## 2017-10-10 DIAGNOSIS — M170 Bilateral primary osteoarthritis of knee: Secondary | ICD-10-CM | POA: Diagnosis not present

## 2017-10-10 DIAGNOSIS — Z800 Family history of malignant neoplasm of digestive organs: Secondary | ICD-10-CM

## 2017-10-10 DIAGNOSIS — S301XXA Contusion of abdominal wall, initial encounter: Secondary | ICD-10-CM | POA: Diagnosis present

## 2017-10-10 DIAGNOSIS — Z8619 Personal history of other infectious and parasitic diseases: Secondary | ICD-10-CM | POA: Diagnosis not present

## 2017-10-10 DIAGNOSIS — I255 Ischemic cardiomyopathy: Secondary | ICD-10-CM | POA: Diagnosis present

## 2017-10-10 DIAGNOSIS — E785 Hyperlipidemia, unspecified: Secondary | ICD-10-CM | POA: Diagnosis not present

## 2017-10-10 DIAGNOSIS — J449 Chronic obstructive pulmonary disease, unspecified: Secondary | ICD-10-CM | POA: Diagnosis present

## 2017-10-10 DIAGNOSIS — Z7901 Long term (current) use of anticoagulants: Secondary | ICD-10-CM

## 2017-10-10 DIAGNOSIS — K219 Gastro-esophageal reflux disease without esophagitis: Secondary | ICD-10-CM | POA: Diagnosis not present

## 2017-10-10 DIAGNOSIS — Z87891 Personal history of nicotine dependence: Secondary | ICD-10-CM

## 2017-10-10 DIAGNOSIS — Z8041 Family history of malignant neoplasm of ovary: Secondary | ICD-10-CM

## 2017-10-10 DIAGNOSIS — I480 Paroxysmal atrial fibrillation: Secondary | ICD-10-CM | POA: Diagnosis present

## 2017-10-10 DIAGNOSIS — Z7902 Long term (current) use of antithrombotics/antiplatelets: Secondary | ICD-10-CM | POA: Diagnosis not present

## 2017-10-10 DIAGNOSIS — I10 Essential (primary) hypertension: Secondary | ICD-10-CM | POA: Diagnosis present

## 2017-10-10 DIAGNOSIS — S301XXD Contusion of abdominal wall, subsequent encounter: Secondary | ICD-10-CM | POA: Diagnosis not present

## 2017-10-10 DIAGNOSIS — Z79899 Other long term (current) drug therapy: Secondary | ICD-10-CM

## 2017-10-10 DIAGNOSIS — E876 Hypokalemia: Secondary | ICD-10-CM | POA: Diagnosis present

## 2017-10-10 LAB — COMPREHENSIVE METABOLIC PANEL
ALBUMIN: 2.5 g/dL — AB (ref 3.5–5.0)
ALK PHOS: 93 U/L (ref 38–126)
ALT: 11 U/L (ref 0–44)
ANION GAP: 10 (ref 5–15)
AST: 22 U/L (ref 15–41)
BUN: 18 mg/dL (ref 8–23)
CALCIUM: 7.7 mg/dL — AB (ref 8.9–10.3)
CHLORIDE: 92 mmol/L — AB (ref 98–111)
CO2: 29 mmol/L (ref 22–32)
Creatinine, Ser: 3.88 mg/dL — ABNORMAL HIGH (ref 0.61–1.24)
GFR calc non Af Amer: 15 mL/min — ABNORMAL LOW (ref >60)
GFR, EST AFRICAN AMERICAN: 18 mL/min — AB (ref >60)
GLUCOSE: 123 mg/dL — AB (ref 70–99)
Potassium: 2.7 mmol/L — CL (ref 3.5–5.1)
SODIUM: 131 mmol/L — AB (ref 135–145)
Total Bilirubin: 0.5 mg/dL (ref 0.3–1.2)
Total Protein: 6.2 g/dL — ABNORMAL LOW (ref 6.5–8.1)

## 2017-10-10 LAB — CBC WITH DIFFERENTIAL/PLATELET
BASOS PCT: 0 %
Basophils Absolute: 0 10*3/uL (ref 0.0–0.1)
EOS ABS: 0 10*3/uL (ref 0.0–0.7)
Eosinophils Relative: 0 %
HCT: 28.4 % — ABNORMAL LOW (ref 39.0–52.0)
Hemoglobin: 9.5 g/dL — ABNORMAL LOW (ref 13.0–17.0)
Lymphocytes Relative: 5 %
Lymphs Abs: 0.4 10*3/uL — ABNORMAL LOW (ref 0.7–4.0)
MCH: 33 pg (ref 26.0–34.0)
MCHC: 33.5 g/dL (ref 30.0–36.0)
MCV: 98.6 fL (ref 78.0–100.0)
MONO ABS: 0.9 10*3/uL (ref 0.1–1.0)
MONOS PCT: 11 %
NEUTROS PCT: 84 %
Neutro Abs: 6.8 10*3/uL (ref 1.7–7.7)
Platelets: 230 10*3/uL (ref 150–400)
RBC: 2.88 MIL/uL — ABNORMAL LOW (ref 4.22–5.81)
RDW: 14.2 % (ref 11.5–15.5)
WBC: 8.1 10*3/uL (ref 4.0–10.5)

## 2017-10-10 LAB — PROTIME-INR
INR: 2.51
PROTHROMBIN TIME: 26.9 s — AB (ref 11.4–15.2)

## 2017-10-10 LAB — MAGNESIUM: Magnesium: 1.8 mg/dL (ref 1.7–2.4)

## 2017-10-10 LAB — I-STAT CG4 LACTIC ACID, ED: LACTIC ACID, VENOUS: 1.14 mmol/L (ref 0.5–1.9)

## 2017-10-10 MED ORDER — OXYCODONE-ACETAMINOPHEN 5-325 MG PO TABS
1.0000 | ORAL_TABLET | Freq: Four times a day (QID) | ORAL | Status: DC | PRN
  Administered 2017-10-10 – 2017-10-11 (×3): 1 via ORAL
  Filled 2017-10-10 (×3): qty 1

## 2017-10-10 MED ORDER — CALCIUM ACETATE (PHOS BINDER) 667 MG PO CAPS
667.0000 mg | ORAL_CAPSULE | Freq: Every day | ORAL | Status: DC
  Administered 2017-10-10 – 2017-10-11 (×2): 667 mg via ORAL
  Filled 2017-10-10 (×2): qty 1

## 2017-10-10 MED ORDER — ONDANSETRON HCL 4 MG PO TABS
4.0000 mg | ORAL_TABLET | Freq: Four times a day (QID) | ORAL | Status: DC | PRN
  Administered 2017-10-10: 4 mg via ORAL
  Filled 2017-10-10: qty 1

## 2017-10-10 MED ORDER — SENNOSIDES-DOCUSATE SODIUM 8.6-50 MG PO TABS: 1.0000 | ORAL_TABLET | Freq: Every evening | ORAL | Status: DC | PRN

## 2017-10-10 MED ORDER — FLUTICASONE FUROATE-VILANTEROL 200-25 MCG/INH IN AEPB
1.0000 | INHALATION_SPRAY | Freq: Every day | RESPIRATORY_TRACT | Status: DC | PRN
  Filled 2017-10-10: qty 28

## 2017-10-10 MED ORDER — ACETAMINOPHEN 325 MG PO TABS: 650.0000 mg | ORAL_TABLET | Freq: Four times a day (QID) | ORAL | Status: DC | PRN

## 2017-10-10 MED ORDER — POTASSIUM CHLORIDE CRYS ER 20 MEQ PO TBCR
40.0000 meq | EXTENDED_RELEASE_TABLET | Freq: Once | ORAL | Status: AC
  Administered 2017-10-10: 40 meq via ORAL
  Filled 2017-10-10: qty 2

## 2017-10-10 MED ORDER — RENA-VITE PO TABS
1.0000 | ORAL_TABLET | Freq: Every day | ORAL | Status: DC
  Administered 2017-10-10: 1 via ORAL
  Filled 2017-10-10: qty 1

## 2017-10-10 MED ORDER — CALCIUM ACETATE (PHOS BINDER) 667 MG PO CAPS: 667.0000 mg | ORAL_CAPSULE | Freq: Every day | ORAL | Status: DC

## 2017-10-10 MED ORDER — ONDANSETRON HCL 4 MG/2ML IJ SOLN: 4.0000 mg | Freq: Four times a day (QID) | INTRAMUSCULAR | Status: DC | PRN

## 2017-10-10 MED ORDER — CARVEDILOL 12.5 MG PO TABS
25.0000 mg | ORAL_TABLET | Freq: Two times a day (BID) | ORAL | Status: DC
  Administered 2017-10-10 – 2017-10-11 (×2): 25 mg via ORAL
  Filled 2017-10-10 (×2): qty 2

## 2017-10-10 MED ORDER — VITAMIN D 1000 UNITS PO TABS
2000.0000 [IU] | ORAL_TABLET | Freq: Every day | ORAL | Status: DC
  Administered 2017-10-10 – 2017-10-11 (×2): 2000 [IU] via ORAL
  Filled 2017-10-10 (×2): qty 2

## 2017-10-10 MED ORDER — PANTOPRAZOLE SODIUM 40 MG PO TBEC
40.0000 mg | DELAYED_RELEASE_TABLET | Freq: Two times a day (BID) | ORAL | Status: DC
  Administered 2017-10-10 – 2017-10-11 (×2): 40 mg via ORAL
  Filled 2017-10-10 (×2): qty 1

## 2017-10-10 MED ORDER — SODIUM CHLORIDE 0.9% FLUSH: 3.0000 mL | INTRAVENOUS | Status: DC | PRN

## 2017-10-10 MED ORDER — ACETAMINOPHEN 650 MG RE SUPP: 650.0000 mg | Freq: Four times a day (QID) | RECTAL | Status: DC | PRN

## 2017-10-10 MED ORDER — DIALYVITE 800 0.8 MG PO TABS: 1.0000 | ORAL_TABLET | Freq: Every day | ORAL | Status: DC

## 2017-10-10 MED ORDER — RENA-VITE PO TABS: 1.0000 | ORAL_TABLET | Freq: Every day | ORAL | Status: DC

## 2017-10-10 MED ORDER — SODIUM CHLORIDE 0.9% FLUSH
3.0000 mL | Freq: Two times a day (BID) | INTRAVENOUS | Status: DC
  Administered 2017-10-10 – 2017-10-11 (×2): 3 mL via INTRAVENOUS

## 2017-10-10 MED ORDER — HYOSCYAMINE SULFATE 0.5 MG/ML IJ SOLN
0.5000 mg | Freq: Four times a day (QID) | INTRAMUSCULAR | Status: DC | PRN
  Filled 2017-10-10: qty 1

## 2017-10-10 MED ORDER — CALCITRIOL 0.25 MCG PO CAPS: 0.2500 ug | ORAL_CAPSULE | ORAL | Status: DC

## 2017-10-10 MED ORDER — SODIUM CHLORIDE 0.9 % IV SOLN: 250.0000 mL | INTRAVENOUS | Status: DC | PRN

## 2017-10-10 NOTE — ED Notes (Signed)
Date and time results received: 10/10/17 06:38 (use smartphrase ".now" to insert current time)  Test: Potassium Critical Value: 2.7  Name of Provider Notified: Pollina  Orders Received? Or Actions Taken?: Dr. Betsey Holiday notified

## 2017-10-10 NOTE — Progress Notes (Signed)
Pt arrived to floor with wound vac tubing and dressing but no wound vac.  Pt states he hasn't had the vac on since last night.  Wound foam and dressing removed, cleansed wound with NS and WTD dressing applied.  Wound bed is reddish pink, no drainage.  Surrounding tissue is intact without redness.

## 2017-10-10 NOTE — H&P (Signed)
History and Physical    Aaron MATSUO [name]:[id number] DOB: 05-12-1955 DOA: 10/10/2017  Referring MD/NP/PA: Malachy Moan, EDP PCP: [facility] [place]  Patient coming from: Home  Chief Complaint: Right-sided abdominal pain  HPI: Aaron [name] is a 62 y.o. male with a history of GERD, hypertension, hyperlipidemia and peripheral artery disease who on 08/14/2017 underwent a right iliac stent, right femoral endarterectomy and right SFA stent by Dr. Trula Slade.  He subsequently developed a right groin swelling that was thought to be a hematoma and managed conservatively.  It slowly increased in size.  Was subsequently seen by Dr. Oneida Alar on 09/19/2017 and decision was made for I&D of the right groin.  He was placed on a wound VAC and discharged home.  During that hospitalization developed paraphimosis which patient states has improved.  About 3 days ago he started noticing swelling and pain of the right side of his abdomen.  He comes to the hospital today where a CT scan shows a right lower quadrant rectus sheath intramuscular hematoma.  There is also an upper right thigh fluid collection just superficial to the femoral bifurcation with sinus tract extending to the skin surface at the site of the wound VAC.  New from prior exam.  Admission has been requested for further evaluation and management.  Past Medical/Surgical History: Past Medical History:  Diagnosis Date  . Anxiety   . Arthritis    knees , back , shoulders  . CAD (coronary artery disease)    Mild nonobstructive disease at cardiac catheterization 2002  . Chronic back pain   . CKD (chronic kidney disease), stage II   . COPD (chronic obstructive pulmonary disease) ([place])   . Diverticulitis   . Esophageal reflux   . Essential hypertension   . Hepatitis C    states he no longer has this  . History of kidney stones   . History of syncope   . Hyperlipidemia   . Nephrolithiasis   . PAT (paroxysmal atrial tachycardia) ([place])     . Peripheral arterial disease (HCC)    Occluded left superficial femoral artery status post stent June 2016 - Dr. Trula Slade  . Pneumonia    as child   62 years old  . Polycystic kidney, unspecified type   . Syncope 09/2014    Past Surgical History:  Procedure Laterality Date  . ABDOMINAL AORTOGRAM N/A 08/11/2017   Procedure: ABDOMINAL AORTOGRAM;  Surgeon: Serafina Mitchell, MD;  Location: [place] [facility];  Service: Cardiovascular;  Laterality: N/A;  . APPLICATION OF WOUND VAC Right 08/14/2017   Procedure: APPLICATION OF PREVENA INCISIONAL WOUND VAC RIGHT GROIN;  Surgeon: Serafina Mitchell, MD;  [facility];  Service: Vascular;  Laterality: Right;  . AV FISTULA PLACEMENT Left 09/04/2017   Procedure: ARTERIOVENOUS (AV) FISTULA CREATION LEFT UPPER ARM;  Surgeon: Serafina Mitchell, MD;  Location: [place];  Service: Vascular;  Laterality: Left;  . BACK SURGERY    . BIOPSY  12/17/2016   Procedure: BIOPSY;  Surgeon: Daneil Dolin, MD;  [facility];  Service: Gastroenterology;;  gastric colon  . BIOPSY  04/29/2017   Procedure: BIOPSY;  Surgeon: Daneil Dolin, MD;  [facility];  Service: Endoscopy;;  duodenal biopsies  . BUNIONECTOMY    . CERVICAL SPINE SURGERY    . COLONOSCOPY  2008   Dr. Oneida Alar: rare sigmoid colon diverticulosis, internal hemorrhoids.   . COLONOSCOPY WITH PROPOFOL N/A 12/17/2016   dense left-sided diverticulosis, right colon  ulcers s/p biopsy query occult NSAID use vs transient ischemia, not consistent with IBD. CMV stains negative.   Marland Kitchen ENDARTERECTOMY FEMORAL Right 08/14/2017   Procedure: RIGHT ILLIO-FEMORAL ENDARTERECTOMY;  Surgeon: Serafina Mitchell, MD;  Location: [facility] OR;  Service: Vascular;  Laterality: Right;  . ESOPHAGEAL DILATION  12/17/2016   EGD with mild Schatzki's ring s/p dilatation, small hiatal hernia, erosive gastropathy (negative H.pylori gastritis)  . ESOPHAGOGASTRODUODENOSCOPY  2008   Dr. Oneida Alar: normal esophagus without Barrett's, antritis  and duodenitis, path with H.pylori gastritis  . ESOPHAGOGASTRODUODENOSCOPY (EGD) WITH PROPOFOL N/A 12/17/2016   Procedure: ESOPHAGOGASTRODUODENOSCOPY (EGD) WITH PROPOFOL;  Surgeon: Daneil Dolin, MD;  [facility];  Service: Gastroenterology;  Laterality: N/A;  . ESOPHAGOGASTRODUODENOSCOPY (EGD) WITH PROPOFOL N/A 04/29/2017   Patchy erythema of gastric mucosa diffusely, extensive inflammatory changes in duodenum, geographic ulceration and mucosal edema present, encroaching somewhat on the lumen yet still widely patent, distal second portion of duodenum appeared abnormal, path with peptic duodenitis with ulceration  . HERNIA REPAIR     umbilical  . I&D EXTREMITY Right 09/21/2017   Procedure: IRRIGATION AND DEBRIDEMENT GROIN;  Surgeon: Elam Dutch, MD;  Location: [place];  Service: Vascular;  Laterality: Right;  . INSERTION OF DIALYSIS CATHETER Right 09/04/2017   Procedure: INSERTION OF TUNNELED  DIALYSIS CATHETER - RIGHT INTERNAL JUGULAR PLACEMENT;  Surgeon: Serafina Mitchell, MD;  Location: [facility];  Service: Vascular;  Laterality: Right;  . INSERTION OF ILIAC STENT Right 08/14/2017   Procedure: INSERTION OF RIGHT COMMON ILIAC STENT 19mm x 3mm x 130cm INSERTION OF RIGHT EXTERNAL ILIAC STENT 68mm x 78mm x 130cm INSERTION OF SUPERFICIAL FERMORAL ARTERY STENT 105mm x 87mm x 130cm;  Surgeon: Serafina Mitchell, MD;  Location: [facility] OR;  Service: Vascular;  Laterality: Right;  . IR FLUORO GUIDE CV LINE RIGHT  08/31/2017  . IR US GUIDE VASC ACCESS RIGHT  08/31/2017  . laparoscopic inguinal hernia right  02/2017   [facility]  . LOWER EXTREMITY ANGIOGRAPHY Right 08/11/2017   Procedure: LOWER EXTREMITY ANGIOGRAPHY;  Surgeon: Serafina Mitchell, MD;  Location: [facility];  Service: Cardiovascular;  Laterality: Right;  . PATCH ANGIOPLASTY Right 08/14/2017   Procedure: PATCH ANGIOPLASTY USING HEMASHIELD PATCH 0.3IN Lillie Columbia;  Surgeon: Serafina Mitchell, MD;  [facility];  Service: Vascular;  Laterality: Right;   . PERIPHERAL VASCULAR CATHETERIZATION N/A 09/20/2014   Procedure: Abdominal Aortogram;  Surgeon: Serafina Mitchell, MD;  Location: [facility];  Service: Cardiovascular;  Laterality: N/A;    Social History:  reports that he quit smoking about 2 months ago. His smoking use included cigarettes. He has a 23.50 pack-year smoking history. He has quit using smokeless tobacco. His smokeless tobacco use included snuff and chew. He reports that he has current or past drug history. He reports that he does not drink alcohol.  Allergies: No Known Allergies  Family History:  Family History  Problem Relation Age of Onset  . Alcoholism Mother   . Heart disease Father        Massive heart attack  . Heart attack Father   . Atrial fibrillation Father   . Colon cancer Father   . Colon cancer Maternal Grandfather 39  . Alcoholism Maternal Grandfather   . Renal cancer Cousin   . Ovarian cancer Sister     Prior to Admission medications   Medication Sig Start Date End Date Taking? Authorizing Provider  B Complex-C-Folic Acid (DIALYVITE 992) 0.8 MG TABS Take  1 tablet by mouth daily. 09/15/17  Yes [provider]  BREO ELLIPTA 200-25 MCG/INH AEPB Inhale 1 puff into the lungs daily as needed (wheezing).  07/27/17  Yes [provider]  calcium acetate (PHOSLO) 667 MG capsule Take 667 mg by mouth daily. 09/15/17  Yes [provider]  carvedilol (COREG) 25 MG tablet Take 1 tablet (25 mg total) 2 (two) times daily by mouth. 02/25/17 10/10/17 Yes Imogene Burn, PA-C  cholecalciferol (VITAMIN D) 1000 units tablet Take 2,000 Units by mouth daily.   Yes [provider]  clopidogrel (PLAVIX) 75 MG tablet Take 1 tablet (75 mg total) by mouth daily. 09/23/17  Yes Ulyses Amor, PA-C  oxyCODONE-acetaminophen (PERCOCET/ROXICET) 5-325 MG tablet Take 1 tablet by mouth every 6 (six) hours as needed. 09/23/17  Yes Ulyses Amor, PA-C  pantoprazole (PROTONIX) 40 MG tablet Take 1 tablet  (40 mg total) by mouth 2 (two) times daily. 04/30/17 10/10/17 Yes Shah, Pratik D, DO  warfarin (COUMADIN) 5 MG tablet Take 1.5 tablets (7.5 mg total) by mouth daily at 6 PM. Patient taking differently: Take 2.5 mg by mouth See admin instructions. 5mg  by mouth once daily Monday-Friday, 2.5mg  by mouth Sat, Sun 09/05/17  Yes Ghimire, Henreitta Leber, MD  calcitRIOL (ROCALTROL) 0.25 MCG capsule Take 1 capsule (0.25 mcg total) by mouth Every Tuesday,Thursday,and Saturday with dialysis. Patient not taking: Reported on 10/10/2017 09/08/17   Jonetta Osgood, MD    Review of Systems:  Constitutional: Denies fever, chills, diaphoresis, appetite change and fatigue.  HEENT: Denies photophobia, eye pain, redness, hearing loss, ear pain, congestion, sore throat, rhinorrhea, sneezing, mouth sores, trouble swallowing, neck pain, neck stiffness and tinnitus.   Respiratory: Denies SOB, DOE, cough, chest tightness,  and wheezing.   Cardiovascular: Denies chest pain, palpitations and leg swelling.  Gastrointestinal: Denies nausea, vomiting,  diarrhea, constipation, blood in stool and abdominal distention.  Genitourinary: Denies dysuria, urgency, frequency, hematuria, flank pain and difficulty urinating.  Endocrine: Denies: hot or cold intolerance, sweats, changes in hair or nails, polyuria, polydipsia. Musculoskeletal: Denies myalgias, back pain, joint swelling, arthralgias and gait problem.  Skin: Denies pallor, rash and wound.  Neurological: Denies dizziness, seizures, syncope, weakness, light-headedness, numbness and headaches.  Hematological: Denies adenopathy. Easy bruising, personal or family bleeding history  Psychiatric/Behavioral: Denies suicidal ideation, mood changes, confusion, nervousness, sleep disturbance and agitation    Physical Exam: Vitals:   10/10/17 1031 10/10/17 1049 10/10/17 1325 10/10/17 1521  BP: (!) 144/78 134/73 (!) 148/82 130/72  Pulse: 67 81 70 66  Resp: 16 18 20 14   Temp:  98.3 F  (36.8 C) 98.8 F (37.1 C) 98.7 F (37.1 C)  TempSrc:  Oral Oral Oral  SpO2: 97% 96% 99% 96%  Weight:      Height:         Constitutional: NAD, calm, comfortable Eyes: PERRL, lids and conjunctivae normal ENMT: Mucous membranes are moist. Posterior pharynx clear of any exudate or lesions.Normal dentition.  Neck: normal, supple, no masses, no thyromegaly Respiratory: clear to auscultation bilaterally, no wheezing, no crackles. Normal respiratory effort. No accessory muscle use.  Cardiovascular: Regular rate and rhythm, no murmurs / rubs / gallops. No extremity edema. 2+ pedal pulses. No carotid bruits.  Abdomen: Tenderness to the right lower quadrant of the abdomen.  Redness extends from the midline all the way around to his right hip.  No organomegaly noted, positive bowel sounds. Musculoskeletal: no clubbing / cyanosis. No joint deformity upper and lower extremities. Good  ROM, no contractures. Normal muscle tone.  Skin: no rashes, lesions, ulcers. No induration Neurologic: CN 2-12 grossly intact. Sensation intact, DTR normal. Strength 5/5 in all 4.  Psychiatric: Normal judgment and insight. Alert and oriented x 3. Normal mood.    Labs on Admission: I have personally reviewed the following labs and imaging studies  CBC: Recent Labs  Lab 10/10/17 0551  WBC 8.1  NEUTROABS 6.8  HGB 9.5*  HCT 28.4*  MCV 98.6  PLT 496   Basic Metabolic Panel: Recent Labs  Lab 10/10/17 0551  NA 131*  K 2.7*  CL 92*  CO2 29  GLUCOSE 123*  BUN 18  CREATININE 3.88*  CALCIUM 7.7*   GFR: Estimated Creatinine Clearance: 24.5 mL/min (A) (by C-G formula based on SCr of 3.88 mg/dL (H)). Liver Function Tests: Recent Labs  Lab 10/10/17 0551  AST 22  ALT 11  ALKPHOS 93  BILITOT 0.5  PROT 6.2*  ALBUMIN 2.5*   No results for input(s): LIPASE, AMYLASE in the last 168 hours. No results for input(s): AMMONIA in the last 168 hours. Coagulation Profile: Recent Labs  Lab 10/10/17 0551  INR  2.51   Cardiac Enzymes: No results for input(s): CKTOTAL, CKMB, CKMBINDEX, TROPONINI in the last 168 hours. BNP (last 3 results) No results for input(s): PROBNP in the last 8760 hours. HbA1C: No results for input(s): HGBA1C in the last 72 hours. CBG: No results for input(s): GLUCAP in the last 168 hours. Lipid Profile: No results for input(s): CHOL, HDL, LDLCALC, TRIG, CHOLHDL, LDLDIRECT in the last 72 hours. Thyroid Function Tests: No results for input(s): TSH, T4TOTAL, FREET4, T3FREE, THYROIDAB in the last 72 hours. Anemia Panel: No results for input(s): VITAMINB12, FOLATE, FERRITIN, TIBC, IRON, RETICCTPCT in the last 72 hours. Urine analysis:    Component Value Date/Time   COLORURINE YELLOW 09/19/2017 0701   APPEARANCEUR CLEAR 09/19/2017 0701   LABSPEC 1.008 09/19/2017 0701   PHURINE 6.0 09/19/2017 0701   GLUCOSEU NEGATIVE 09/19/2017 0701   HGBUR SMALL (A) 09/19/2017 0701   BILIRUBINUR NEGATIVE 09/19/2017 0701   KETONESUR NEGATIVE 09/19/2017 0701   PROTEINUR 30 (A) 09/19/2017 0701   UROBILINOGEN 0.2 03/03/2010 1056   NITRITE NEGATIVE 09/19/2017 0701   LEUKOCYTESUR NEGATIVE 09/19/2017 0701   Sepsis Labs: @LABRCNTIP (procalcitonin:4,lacticidven:4) ) Recent Results (from the past 240 hour(s))  Culture, blood (Routine X 2) w Reflex to ID Panel     Status: None (Preliminary result)   Collection Time: 10/10/17  5:50 AM  Result Value Ref Range Status   Specimen Description RIGHT ANTECUBITAL  Final   Special Requests   Final    BOTTLES DRAWN AEROBIC AND ANAEROBIC Blood Culture adequate volume   Culture   Final    NO GROWTH <12 HOURS Performed at [facility], [address] [place], [place] [address]    Report Status PENDING  Incomplete  Culture, blood (Routine X 2) w Reflex to ID Panel     Status: None (Preliminary result)   Collection Time: 10/10/17  6:00 AM  Result Value Ref Range Status   Specimen Description BLOOD RIGHT ARM  Final   Special Requests   Final     BOTTLES DRAWN AEROBIC AND ANAEROBIC Blood Culture adequate volume   Culture   Final    NO GROWTH <12 HOURS Performed at [facility], [address] [place], [place] [address]    Report Status PENDING  Incomplete     Radiological Exams on Admission: Ct Abdomen Pelvis Wo Contrast  Result Date:  10/10/2017 CLINICAL DATA:  Right-sided abdominal pain EXAM: CT ABDOMEN AND PELVIS WITHOUT CONTRAST TECHNIQUE: Multidetector CT imaging of the abdomen and pelvis was performed following the standard protocol without IV contrast. COMPARISON:  CT abdomen pelvis 08/27/2017 FINDINGS: LOWER CHEST: No basilar pulmonary nodules or pleural effusion. No apical pericardial effusion. HEPATOBILIARY: Numerous cysts throughout the liver are again noted. Hepatic size and contours are normal. Normal gallbladder. PANCREAS: Normal parenchymal contours without ductal dilatation. No peripancreatic fluid collection. SPLEEN: Normal. ADRENALS/URINARY TRACT: --Adrenal glands: Normal. --Right kidney/ureter: Innumerable cysts, with unchanged distribution of a few scattered hyperdense lesions, the largest of which measures 3 cm, unchanged. --Left kidney/ureter: Unchanged appearance of innumerable cysts with largest hyperdense lesion measuring 4.5 cm. Multiple calcifications. No hydronephrosis. --Urinary bladder: Normal for degree of distention STOMACH/BOWEL: --Stomach/Duodenum: No hiatal hernia or other gastric abnormality. Normal duodenal course. --Small bowel: No dilatation or inflammation. --Colon: No focal abnormality. --Appendix: Surgically absent. VASCULAR/LYMPHATIC: Atherosclerotic calcification is present within the non-aneurysmal abdominal aorta, without hemodynamically significant stenosis. No abdominal or pelvic lymphadenopathy. REPRODUCTIVE: Normal prostate and seminal vesicles. MUSCULOSKELETAL. Within the right lower quadrant rectus sheath musculature there is a hematoma that measures approximately 8.0 x 12.5 x 3.6 cm. OTHER: At  the anterior right thigh, just superficial to the femoral bifurcation, there is a fluid collection measuring 5.5 x 5.3 x 5.9 cm. There is a tract leading to the skin, where there is a wound VAC in place. There is diffuse anasarca. IMPRESSION: 1. Upper right thigh fluid collection just superficial to the femoral bifurcation with sinus tract extending to the skin surface at the site of the wound VAC. This is new from the prior exam. 2. Right lower quadrant rectus sheath intramuscular hematoma. 3.  Aortic Atherosclerosis (ICD10-I70.0). 4. Polycystic liver and kidney disease. Electronically Signed   By: Ulyses Jarred M.D.   On: 10/10/2017 06:38    EKG: Independently reviewed.  None obtained in the ED  Assessment/Plan Principal Problem:   Hematoma of abdominal wall Active Problems:   Essential hypertension   Esophageal reflux   PAD (peripheral artery disease) (HCC)   CKD (chronic kidney disease), stage V (HCC)   AF (paroxysmal atrial fibrillation) (HCC)   Hyperlipidemia    Rectus sheath hematoma -Hold Coumadin, hold Plavix. -EDP has discussed case with Dr. Trula Slade who is on-call today for vascular surgery.  He states that there is no role for surgical care at this point.  Recommends cessation of anticoagulants and monitor patient overnight with follow-up as an outpatient in their office next week.  End-stage renal disease -Normally dialyzes on a Tuesday, Thursday, Saturday schedule. -He missed dialysis on Thursday and was dialyzed on Friday instead.  If patient is discharged tomorrow he can follow-up with his outpatient dialysis center.  If he will stay past tomorrow we will contact nephrology for dialysis assistance.  GERD -Continue Protonix  Hypertension -Well-controlled -Continue home medications.  Peripheral artery disease -Plavix on hold due to ongoing hematoma. -Vascular is aware of new area of fluid collection at site of prior I&D, per Dr. Trula Slade no need for surgical intervention  and outpatient follow-up.  Atrial fibrillation -Rate controlled, Coumadin on hold due to abdominal wall hematoma.  Hypokalemia -Potassium 2.7 on admission, will give 40 mEq of KCl, check magnesium.   DVT prophylaxis: SCDs Code Status: Full code Family Communication: Patient only Disposition Plan: Would anticipate discharge home in 24 hours as long as hemoglobin remains stable Consults called: Phone consultation with vascular surgery Admission status: It is my clinical opinion  that referral for OBSERVATION is reasonable and necessary in this patient based on the above information provided. The aforementioned taken together are felt to place the patient at high risk for further clinical deterioration. However it is anticipated that the patient may be medically stable for discharge from the hospital within 24 to 48 hours.     Time Spent: 95 minutes  Estela Isaac Bliss MD [facility] Pager [phone]  If 7PM-7AM, please contact night-coverage [email or web address] Password [facility]  10/10/2017, 4:21 PM

## 2017-10-10 NOTE — ED Triage Notes (Signed)
Pt c/o right side abdomen pain, swelling and redness that started yesterday; pt has a wound vac to right groin from surgery he had the beginning of June; pt had dialysis yesterday

## 2017-10-10 NOTE — ED Provider Notes (Addendum)
[facility] EMERGENCY DEPARTMENT Provider Note   CSN: [id number] Arrival date & time: 10/10/17  0451     History   Chief Complaint Chief Complaint  Patient presents with  . Abdominal Pain    HPI Aaron [name] is a 62 y.o. male.  Patient presents to the ER stating that he thinks he has an infection of his abdomen.  Patient recently underwent revascularization of his right leg secondary to critical limb ischemia.  He had a complicating lymphocele requiring repeat surgery and has a wound VAC in place.  Reports that over the last couple of days he has noticed increased pain and some swelling of the right groin and lower abdomen area.  When he went to dialysis today they told him he should come to the ER directly.  He reports that he was tired, went home and slept for a number of hours, then woke up a couple of hours ago with increased pain.  He has not noticed any fever.     Past Medical History:  Diagnosis Date  . Anxiety   . Arthritis    knees , back , shoulders  . CAD (coronary artery disease)    Mild nonobstructive disease at cardiac catheterization 2002  . Chronic back pain   . CKD (chronic kidney disease), stage II   . COPD (chronic obstructive pulmonary disease) ([place])   . Diverticulitis   . Esophageal reflux   . Essential hypertension   . Hepatitis C    states he no longer has this  . History of kidney stones   . History of syncope   . Hyperlipidemia   . Nephrolithiasis   . PAT (paroxysmal atrial tachycardia) ([place])   . Peripheral arterial disease (HCC)    Occluded left superficial femoral artery status post stent June 2016 - Dr. Trula Slade  . Pneumonia    as child   78 years old  . Polycystic kidney, unspecified type   . Syncope 09/2014    Patient Active Problem List   Diagnosis Date Noted  . Seroma due to trauma ([place]) 09/19/2017  . Volume overload   . Groin swelling 08/24/2017  . Hyperlipidemia 08/24/2017  . Duodenitis 04/27/2017  . Schatzki's ring of distal  esophagus 04/27/2017  . Encounter for therapeutic drug monitoring 03/03/2017  . Abdominal pain 02/20/2017  . History of hepatitis C 02/20/2017  . Ischemic cardiomyopathy 01/27/2017  . Dyslipidemia 01/27/2017  . Paroxysmal atrial fibrillation ([place]) 01/27/2017  . Preoperative clearance 01/21/2017  . CAD (coronary artery disease) 01/21/2017  . Pain of upper abdomen   . Acute renal failure (ARF) ([place]) 12/16/2016  . Rectal bleeding   . AKI (acute kidney injury) ([place]) 12/15/2016  . CKD (chronic kidney disease), stage V ([place]) 12/15/2016  . Hepatitis C 12/15/2016  . Polycystic kidney disease 12/14/2016  . Jerking 09/23/2014  . Syncope 09/23/2014  . COPD (chronic obstructive pulmonary disease) ([place])   . COLD (chronic obstructive lung disease) ([place])   . Depression   . PAD (peripheral artery disease) ([place]) 09/19/2014  . Preop cardiovascular exam 10/28/2010  . Tobacco abuse 10/28/2010  . SYNCOPE 05/07/2010  . UNSPECIFIED IRON DEFICIENCY ANEMIA 10/04/2009  . 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  . Essential hypertension 10/04/2009  . Esophageal reflux 10/04/2009  . PRECORDIAL PAIN 10/04/2009    Past Surgical History:  Procedure Laterality Date  . ABDOMINAL AORTOGRAM N/A 08/11/2017   Procedure: ABDOMINAL AORTOGRAM;  Surgeon: Serafina Mitchell, MD;  Location: [place] [facility];  Service: Cardiovascular;  Laterality:  N/A;  . APPLICATION OF WOUND VAC Right 08/14/2017   Procedure: APPLICATION OF PREVENA INCISIONAL WOUND VAC RIGHT GROIN;  Surgeon: Serafina Mitchell, MD;  Location: [facility] OR;  Service: Vascular;  Laterality: Right;  . AV FISTULA PLACEMENT Left 09/04/2017   Procedure: ARTERIOVENOUS (AV) FISTULA CREATION LEFT UPPER ARM;  Surgeon: Serafina Mitchell, MD;  Location: [place];  Service: Vascular;  Laterality: Left;  . BACK SURGERY    . BIOPSY  12/17/2016   Procedure: BIOPSY;  Surgeon: Daneil Dolin, MD;  [facility];  Service: Gastroenterology;;  gastric colon  . BIOPSY  04/29/2017    Procedure: BIOPSY;  Surgeon: Daneil Dolin, MD;  [facility];  Service: Endoscopy;;  duodenal biopsies  . BUNIONECTOMY    . CERVICAL SPINE SURGERY    . COLONOSCOPY  2008   Dr. Oneida Alar: rare sigmoid colon diverticulosis, internal hemorrhoids.   . COLONOSCOPY WITH PROPOFOL N/A 12/17/2016   dense left-sided diverticulosis, right colon ulcers s/p biopsy query occult NSAID use vs transient ischemia, not consistent with IBD. CMV stains negative.   Marland Kitchen ENDARTERECTOMY FEMORAL Right 08/14/2017   Procedure: RIGHT ILLIO-FEMORAL ENDARTERECTOMY;  Surgeon: Serafina Mitchell, MD;  Location: [facility] OR;  Service: Vascular;  Laterality: Right;  . ESOPHAGEAL DILATION  12/17/2016   EGD with mild Schatzki's ring s/p dilatation, small hiatal hernia, erosive gastropathy (negative H.pylori gastritis)  . ESOPHAGOGASTRODUODENOSCOPY  2008   Dr. Oneida Alar: normal esophagus without Barrett's, antritis and duodenitis, path with H.pylori gastritis  . ESOPHAGOGASTRODUODENOSCOPY (EGD) WITH PROPOFOL N/A 12/17/2016   Procedure: ESOPHAGOGASTRODUODENOSCOPY (EGD) WITH PROPOFOL;  Surgeon: Daneil Dolin, MD;  [facility];  Service: Gastroenterology;  Laterality: N/A;  . ESOPHAGOGASTRODUODENOSCOPY (EGD) WITH PROPOFOL N/A 04/29/2017   Patchy erythema of gastric mucosa diffusely, extensive inflammatory changes in duodenum, geographic ulceration and mucosal edema present, encroaching somewhat on the lumen yet still widely patent, distal second portion of duodenum appeared abnormal, path with peptic duodenitis with ulceration  . HERNIA REPAIR     umbilical  . I&D EXTREMITY Right 09/21/2017   Procedure: IRRIGATION AND DEBRIDEMENT GROIN;  Surgeon: Elam Dutch, MD;  Location: [place];  Service: Vascular;  Laterality: Right;  . INSERTION OF DIALYSIS CATHETER Right 09/04/2017   Procedure: INSERTION OF TUNNELED  DIALYSIS CATHETER - RIGHT INTERNAL JUGULAR PLACEMENT;  Surgeon: Serafina Mitchell, MD;  Location: [place];  Service: Vascular;   Laterality: Right;  . INSERTION OF ILIAC STENT Right 08/14/2017   Procedure: INSERTION OF RIGHT COMMON ILIAC STENT 52mm x 86mm x 130cm INSERTION OF RIGHT EXTERNAL ILIAC STENT 7mm x 78mm x 130cm INSERTION OF SUPERFICIAL FERMORAL ARTERY STENT 31mm x 80mm x 130cm;  Surgeon: Serafina Mitchell, MD;  Location: [facility] OR;  Service: Vascular;  Laterality: Right;  . IR FLUORO GUIDE CV LINE RIGHT  08/31/2017  . IR US GUIDE VASC ACCESS RIGHT  08/31/2017  . laparoscopic inguinal hernia right  02/2017   [place]  . LOWER EXTREMITY ANGIOGRAPHY Right 08/11/2017   Procedure: LOWER EXTREMITY ANGIOGRAPHY;  Surgeon: Serafina Mitchell, MD;  Location: [place] CV LAB;  Service: Cardiovascular;  Laterality: Right;  . PATCH ANGIOPLASTY Right 08/14/2017   Procedure: PATCH ANGIOPLASTY USING HEMASHIELD PATCH 0.3IN L[place]ie [place];  Surgeon: Serafina Mitchell, MD;  Location[facility];  Service: Vascular;  Laterality: Right;  . PERIPHERAL VASCULAR CATHETERIZATION N/A 09/20/2014   Procedure: Abdominal Aortogram;  Surgeon: Serafina Mitchell, MD;  Location: [place] CV LAB;  Service: Cardiovascular;  Laterality: N/A;        Home Medications    Prior to Admission medications   Medication Sig Start Date End Date Taking? Authorizing Provider  B Complex-C-Folic Acid (DIALYVITE 094) 0.8 MG TABS Take 1 tablet by mouth daily. 09/15/17   [provider]  BREO ELLIPTA 200-25 MCG/INH AEPB Inhale 1 puff into the lungs daily as needed (wheezing).  07/27/17   [provider]  calcitRIOL (ROCALTROL) 0.25 MCG capsule Take 1 capsule (0.25 mcg total) by mouth Every Tuesday,Thursday,and Saturday with dialysis. 09/08/17   Ghimire, Henreitta Leber, MD  calcium acetate (PHOSLO) 667 MG capsule Take 667 mg by mouth daily. 09/15/17   [provider]  carvedilol (COREG) 25 MG tablet Take 1 tablet (25 mg total) 2 (two) times daily by mouth. 02/25/17 09/19/17  Imogene Burn, PA-C  cholecalciferol (VITAMIN D) 1000 units tablet Take 2,000 Units by mouth  daily.    [provider]  clopidogrel (PLAVIX) 75 MG tablet Take 1 tablet (75 mg total) by mouth daily. 09/23/17   Ulyses Amor, PA-C  ferrous sulfate 325 (65 FE) MG tablet Take 325 mg by mouth daily with breakfast.    [provider]  oxyCODONE-acetaminophen (PERCOCET/ROXICET) 5-325 MG tablet Take 1 tablet by mouth every 6 (six) hours as needed. 09/23/17   Ulyses Amor, PA-C  pantoprazole (PROTONIX) 40 MG tablet Take 1 tablet (40 mg total) by mouth 2 (two) times daily. 04/30/17 09/19/17  Manuella Ghazi, Pratik D, DO  warfarin (COUMADIN) 5 MG tablet Take 1.5 tablets (7.5 mg total) by mouth daily at 6 PM. Patient taking differently: Take 2.5 mg by mouth See admin instructions. 5mg  by mouth once daily Monday-Friday, 2.5mg  by mouth Sat, Sun 09/05/17   Ghimire, Henreitta Leber, MD    Family History Family History  Problem Relation Age of Onset  . Alcoholism Mother   . Heart disease Father        Massive heart attack  . Heart attack Father   . Atrial fibrillation Father   . Colon cancer Father   . Colon cancer Maternal Grandfather 64  . Alcoholism Maternal Grandfather   . Renal cancer Cousin   . Ovarian cancer Sister     Social History Social History   Tobacco Use  . Smoking status: Former Smoker    Packs/day: 0.50    Years: 47.00    Pack years: 23.50    Types: Cigarettes    Last attempt to quit: 08/03/2017    Years since quitting: 0.1  . Smokeless tobacco: Former Systems developer    Types: Snuff, Chew  Substance Use Topics  . Alcohol use: No    Alcohol/week: 0.0 oz    Comment: alcohol free 2017,  heavy drinker in the past  . Drug use: Not Currently    Comment: states in remote past he used "whatever was around"     Allergies   Patient has no known allergies.   Review of Systems Review of Systems  Skin: Positive for color change.  All other systems reviewed and are negative.    Physical Exam Updated Vital Signs BP (!) 155/83 (BP Location: Left Arm) Comment: Simultaneous  filing. User may not have seen previous data.  Pulse 78 Comment: Simultaneous filing. User may not have seen previous data.  Temp 98.3 F (36.8 C) (Oral)   Ht 6\' 4"  (1.93 m)   Wt 93 kg (205 lb)   SpO2 95% Comment: Simultaneous filing. User may not have seen previous data.  BMI 24.95 kg/m   Physical Exam  Constitutional: He is oriented to person, place, and time. He appears well-developed and well-nourished. No distress.  HENT:  Head: Normocephalic and atraumatic.  Right Ear: Hearing normal.  Left Ear: Hearing normal.  Nose: Nose normal.  Mouth/Throat: Oropharynx is clear and moist and mucous membranes are normal.  Eyes: Pupils are equal, round, and reactive to light. Conjunctivae and EOM are normal.  Neck: Normal range of motion. Neck supple.  Cardiovascular: Regular rhythm, S1 normal and S2 normal. Exam reveals no gallop and no friction rub.  No murmur heard. Pulmonary/Chest: Effort normal and breath sounds normal. No respiratory distress. He exhibits no tenderness.  Abdominal: Soft. Normal appearance and bowel sounds are normal. There is no hepatosplenomegaly. There is no tenderness. There is no rebound, no guarding, no tenderness at McBurney's point and negative Murphy's sign. No hernia.  Musculoskeletal: Normal range of motion.  Neurological: He is alert and oriented to person, place, and time. He has normal strength. No cranial nerve deficit or sensory deficit. Coordination normal. GCS eye subscore is 4. GCS verbal subscore is 5. GCS motor subscore is 6.  Skin: Skin is warm, dry and intact. No rash noted. No cyanosis.  Wound VAC in place right groin.  Area of swelling/fluctuance around the area of wound VAC, no drainage or sign of purulence.  Significant, well demarcated area of erythema, induration right lower abdominal wall  Psychiatric: He has a normal mood and affect. His speech is normal and behavior is normal. Thought content normal.  Nursing note and vitals  reviewed.    ED Treatments / Results  Labs (all labs ordered are listed, but only abnormal results are displayed) Labs Reviewed  CBC WITH DIFFERENTIAL/PLATELET - Abnormal; Notable for the following components:      Result Value   RBC 2.88 (*)    Hemoglobin 9.5 (*)    HCT 28.4 (*)    Lymphs Abs 0.4 (*)    All other components within normal limits  COMPREHENSIVE METABOLIC PANEL - Abnormal; Notable for the following components:   Sodium 131 (*)    Potassium 2.7 (*)    Chloride 92 (*)    Glucose, Bld 123 (*)    Creatinine, Ser 3.88 (*)    Calcium 7.7 (*)    Total Protein 6.2 (*)    Albumin 2.5 (*)    GFR calc non Af Amer 15 (*)    GFR calc Af Amer 18 (*)    All other components within normal limits  PROTIME-INR - Abnormal; Notable for the following components:   Prothrombin Time 26.9 (*)    All other components within normal limits  CULTURE, BLOOD (ROUTINE X 2)  CULTURE, BLOOD (ROUTINE X 2)  I-STAT CG4 LACTIC ACID, ED    EKG None  Radiology Ct Abdomen Pelvis Wo Contrast  Result Date: 10/10/2017 CLINICAL DATA:  Right-sided abdominal pain EXAM: CT ABDOMEN AND PELVIS WITHOUT CONTRAST TECHNIQUE: Multidetector CT imaging of the abdomen and pelvis was performed following the standard protocol without IV contrast. COMPARISON:  CT abdomen pelvis 08/27/2017 FINDINGS: LOWER CHEST: No basilar pulmonary nodules or pleural effusion. No apical pericardial effusion. HEPATOBILIARY: Numerous cysts throughout the liver are again noted. Hepatic size and contours are normal. Normal gallbladder. PANCREAS: Normal parenchymal contours without ductal dilatation. No peripancreatic fluid collection. SPLEEN: Normal. ADRENALS/URINARY TRACT: --Adrenal glands: Normal. --Right kidney/ureter: Innumerable cysts, with unchanged distribution of a few scattered hyperdense lesions, the largest of which measures 3 cm, unchanged. --Left kidney/ureter:  Unchanged appearance of innumerable cysts with largest hyperdense  lesion measuring 4.5 cm. Multiple calcifications. No hydronephrosis. --Urinary bladder: Normal for degree of distention STOMACH/BOWEL: --Stomach/Duodenum: No hiatal hernia or other gastric abnormality. Normal duodenal course. --Small bowel: No dilatation or inflammation. --Colon: No focal abnormality. --Appendix: Surgically absent. VASCULAR/LYMPHATIC: Atherosclerotic calcification is present within the non-aneurysmal abdominal aorta, without hemodynamically significant stenosis. No abdominal or pelvic lymphadenopathy. REPRODUCTIVE: Normal prostate and seminal vesicles. MUSCULOSKELETAL. Within the right lower quadrant rectus sheath musculature there is a hematoma that measures approximately 8.0 x 12.5 x 3.6 cm. OTHER: At the anterior right thigh, just superficial to the femoral bifurcation, there is a fluid collection measuring 5.5 x 5.3 x 5.9 cm. There is a tract leading to the skin, where there is a wound VAC in place. There is diffuse anasarca. IMPRESSION: 1. Upper right thigh fluid collection just superficial to the femoral bifurcation with sinus tract extending to the skin surface at the site of the wound VAC. This is new from the prior exam. 2. Right lower quadrant rectus sheath intramuscular hematoma. 3.  Aortic Atherosclerosis (ICD10-I70.0). 4. Polycystic liver and kidney disease. Electronically Signed   By: Ulyses Jarred M.D.   On: 10/10/2017 06:38    Procedures Procedures (including critical care time)  Medications Ordered in ED Medications  potassium chloride SA (K-DUR,KLOR-CON) CR tablet 40 mEq (has no administration in time range)     Initial Impression / Assessment and Plan / ED Course  I have reviewed the triage vital signs and the nursing notes.  Pertinent labs & imaging results that were available during my care of the patient were reviewed by me and considered in my medical decision making (see chart for details).     Presents to the ER for evaluation of pain and swelling of the  right lower abdomen.  Patient has had a complicated recent history.  He underwent revascularization of his right leg secondary to critical limb ischemia.  He developed a lymphocele which required repeat surgery and he has a wound VAC in place.  Over the last couple of days he has had this increasing swelling and pain of the abdomen.  He did have some erythema and induration present on examination.  This was concerning for possible infection initially.  He does not, however, have a fever or elevated white blood cell count.  No sign of sepsis.  A CT scan was performed.  This shows that the swelling in the right side of his abdomen is a large rectus sheath hematoma.  Patient is on Coumadin.  Findings were discussed with Dr. Trula Slade, on-call for vascular surgery.  He did not feel that the patient required an urgent or emergent surgery at this time.  Recommended admission here for monitoring for 24 hours.  Hold Coumadin, but does not need active reversal.  If not improving or worsening over the next 24 hours, recontact Dr. Trula Slade for transfer to [facility].    Final Clinical Impressions(s) / ED Diagnoses   Final diagnoses:  Abdominal wall hematoma, initial encounter    ED Discharge Orders    None       Tomasina Keasling, Gwenyth Allegra, MD 10/10/17 3532    Orpah Greek, MD 10/10/17 [phone]

## 2017-10-11 DIAGNOSIS — S301XXA Contusion of abdominal wall, initial encounter: Secondary | ICD-10-CM | POA: Diagnosis not present

## 2017-10-11 DIAGNOSIS — S301XXD Contusion of abdominal wall, subsequent encounter: Secondary | ICD-10-CM

## 2017-10-11 LAB — BASIC METABOLIC PANEL
Anion gap: 11 (ref 5–15)
BUN: 25 mg/dL — ABNORMAL HIGH (ref 8–23)
CHLORIDE: 99 mmol/L (ref 98–111)
CO2: 27 mmol/L (ref 22–32)
CREATININE: 4.47 mg/dL — AB (ref 0.61–1.24)
Calcium: 8.1 mg/dL — ABNORMAL LOW (ref 8.9–10.3)
GFR calc non Af Amer: 13 mL/min — ABNORMAL LOW (ref >60)
GFR, EST AFRICAN AMERICAN: 15 mL/min — AB (ref >60)
GLUCOSE: 96 mg/dL (ref 70–99)
Potassium: 3.8 mmol/L (ref 3.5–5.1)
Sodium: 137 mmol/L (ref 135–145)

## 2017-10-11 LAB — CBC
HCT: 30.7 % — ABNORMAL LOW (ref 39.0–52.0)
HEMOGLOBIN: 9.9 g/dL — AB (ref 13.0–17.0)
MCH: 32.2 pg (ref 26.0–34.0)
MCHC: 32.2 g/dL (ref 30.0–36.0)
MCV: 100 fL (ref 78.0–100.0)
PLATELETS: 260 10*3/uL (ref 150–400)
RBC: 3.07 MIL/uL — AB (ref 4.22–5.81)
RDW: 14.8 % (ref 11.5–15.5)
WBC: 8.1 10*3/uL (ref 4.0–10.5)

## 2017-10-11 MED ORDER — HYOSCYAMINE SULFATE ER 0.375 MG PO TB12
0.3750 mg | ORAL_TABLET | Freq: Two times a day (BID) | ORAL | Status: DC | PRN
  Filled 2017-10-11: qty 1

## 2017-10-11 NOTE — Discharge Summary (Signed)
Physician Discharge Summary  Aaron [name] ION:[id number] DOB: [birth year] DOA: 10/10/2017  PCP: Gwenlyn [facility] [place] date: 10/10/2017 Discharge date: 10/11/2017  Time spent: 45 minutes  Recommendations for Outpatient Follow-up:  -Will be discharged home today. -Advised to hold warfarin and plavix for now. -Has follow up with vascular surgery tomorrow. -Has been seen by Dr. Arnoldo Morale today, who is recommending holding off on wound vac for now.   Discharge Diagnoses:  Principal Problem:   Hematoma of abdominal wall Active Problems:   Essential hypertension   Esophageal reflux   PAD (peripheral artery disease) (HCC)   CKD (chronic kidney disease), stage V (HCC)   AF (paroxysmal atrial fibrillation) ([place])   Hyperlipidemia   Discharge Condition: Stable and improved  Filed Weights   10/10/17 0500  Weight: 93 kg (205 lb)    History of present illness:  Aaron [name] is a 62 y.o. male with a history of GERD, hypertension, hyperlipidemia and peripheral artery disease who on 08/14/2017 underwent a right iliac stent, right femoral endarterectomy and right SFA stent by Dr. Trula Slade.  He subsequently developed a right groin swelling that was thought to be a hematoma and managed conservatively.  It slowly increased in size.  Was subsequently seen by Dr. Oneida Alar on 09/19/2017 and decision was made for I&D of the right groin.  He was placed on a wound VAC and discharged home.  During that hospitalization developed paraphimosis which patient states has improved.  About 3 days ago he started noticing swelling and pain of the right side of his abdomen.  He comes to the hospital today where a CT scan shows a right lower quadrant rectus sheath intramuscular hematoma.  There is also an upper right thigh fluid collection just superficial to the femoral bifurcation with sinus tract extending to the skin surface at the site of the wound VAC.  New from prior exam.  Admission has been  requested for further evaluation and management.    Hospital Course:   Rectus sheath hematoma -Holding Coumadin and Plavix. -Hemoglobin has remained stable to slightly increased on follow-up. -Hemoglobin is 9.9 on discharge. -EDP did discuss CT findings with Dr. Trula Slade on admission who advised admission for observation but no surgical indication.   -Anticoagulants are on hold, has follow-up with vascular surgery tomorrow at that time timing of reinitiation of anticoagulation needs to be determined.  End-stage renal disease -Dialysis on Tuesday, Thursday, Saturday. -We will need to follow-up with his dialysis center as scheduled on Tuesday for dialysis.  GERD -Continue Protonix.  Hypertension -Well-controlled.  Continue home medications.  Atrial fibrillation -Rate controlled. -Coumadin is on hold due to abdominal wall hematoma.  Hypokalemia -Potassium was 2.7 on admission, this has been adequately replaced. -Magnesium is normal at 1.8.  Procedures:  None   Consultations:  Curbside with Dr. Aviva Signs with general surgery who has looked at patient's groin wound and has advised keeping wound VAC off.  Discharge Instructions  Discharge Instructions    Diet - low sodium heart healthy   Complete by:  As directed    Increase activity slowly   Complete by:  As directed      Allergies as of 10/11/2017   No Known Allergies     Medication List    STOP taking these medications   clopidogrel 75 MG tablet Commonly known as:  PLAVIX   warfarin 5 MG tablet Commonly known as:  COUMADIN     TAKE these medications   BREO  ELLIPTA 200-25 MCG/INH Aepb Generic drug:  fluticasone furoate-vilanterol Inhale 1 puff into the lungs daily as needed (wheezing).   calcitRIOL 0.25 MCG capsule Commonly known as:  ROCALTROL Take 1 capsule (0.25 mcg total) by mouth Every Tuesday,Thursday,and Saturday with dialysis.   calcium acetate 667 MG capsule Commonly known as:   PHOSLO Take 667 mg by mouth daily.   carvedilol 25 MG tablet Commonly known as:  COREG Take 1 tablet (25 mg total) 2 (two) times daily by mouth.   cholecalciferol 1000 units tablet Commonly known as:  VITAMIN D Take 2,000 Units by mouth daily.   DIALYVITE 800 0.8 MG Tabs Take 1 tablet by mouth daily.   oxyCODONE-acetaminophen 5-325 MG tablet Commonly known as:  PERCOCET/ROXICET Take 1 tablet by mouth every 6 (six) hours as needed.   pantoprazole 40 MG tablet Commonly known as:  PROTONIX Take 1 tablet (40 mg total) by mouth 2 (two) times daily.      No Known Allergies Follow-up Information    Serafina Mitchell, MD Follow up.   Specialties:  Vascular Surgery, Cardiology Why:  As scheduled for tomorrow Contact information: [place] [address] [phone]            The results of significant diagnostics from this hospitalization (including imaging, microbiology, ancillary and laboratory) are listed below for reference.    Significant Diagnostic Studies: Ct Abdomen Pelvis Wo Contrast  Result Date: 10/10/2017 CLINICAL DATA:  Right-sided abdominal pain EXAM: CT ABDOMEN AND PELVIS WITHOUT CONTRAST TECHNIQUE: Multidetector CT imaging of the abdomen and pelvis was performed following the standard protocol without IV contrast. COMPARISON:  CT abdomen pelvis 08/27/2017 FINDINGS: LOWER CHEST: No basilar pulmonary nodules or pleural effusion. No apical pericardial effusion. HEPATOBILIARY: Numerous cysts throughout the liver are again noted. Hepatic size and contours are normal. Normal gallbladder. PANCREAS: Normal parenchymal contours without ductal dilatation. No peripancreatic fluid collection. SPLEEN: Normal. ADRENALS/URINARY TRACT: --Adrenal glands: Normal. --Right kidney/ureter: Innumerable cysts, with unchanged distribution of a few scattered hyperdense lesions, the largest of which measures 3 cm, unchanged. --Left kidney/ureter: Unchanged appearance of  innumerable cysts with largest hyperdense lesion measuring 4.5 cm. Multiple calcifications. No hydronephrosis. --Urinary bladder: Normal for degree of distention STOMACH/BOWEL: --Stomach/Duodenum: No hiatal hernia or other gastric abnormality. Normal duodenal course. --Small bowel: No dilatation or inflammation. --Colon: No focal abnormality. --Appendix: Surgically absent. VASCULAR/LYMPHATIC: Atherosclerotic calcification is present within the non-aneurysmal abdominal aorta, without hemodynamically significant stenosis. No abdominal or pelvic lymphadenopathy. REPRODUCTIVE: Normal prostate and seminal vesicles. MUSCULOSKELETAL. Within the right lower quadrant rectus sheath musculature there is a hematoma that measures approximately 8.0 x 12.5 x 3.6 cm. OTHER: At the anterior right thigh, just superficial to the femoral bifurcation, there is a fluid collection measuring 5.5 x 5.3 x 5.9 cm. There is a tract leading to the skin, where there is a wound VAC in place. There is diffuse anasarca. IMPRESSION: 1. Upper right thigh fluid collection just superficial to the femoral bifurcation with sinus tract extending to the skin surface at the site of the wound VAC. This is new from the prior exam. 2. Right lower quadrant rectus sheath intramuscular hematoma. 3.  Aortic Atherosclerosis (ICD10-I70.0). 4. Polycystic liver and kidney disease. Electronically Signed   By: Ulyses Jarred M.D.   On: 10/10/2017 06:38    Microbiology: Recent Results (from the past 240 hour(s))  Culture, blood (Routine X 2) w Reflex to ID Panel     Status: None (Preliminary result)   Collection Time: 10/10/17  5:50 AM  Result Value Ref Range Status   Specimen Description RIGHT ANTECUBITAL  Final   Special Requests   Final    BOTTLES DRAWN AEROBIC AND ANAEROBIC Blood Culture adequate volume   Culture   Final    NO GROWTH 1 DAY Performed at [facility], [address], [place], [place] [address]    Report Status PENDING  Incomplete   Culture, blood (Routine X 2) w Reflex to ID Panel     Status: None (Preliminary result)   Collection Time: 10/10/17  6:00 AM  Result Value Ref Range Status   Specimen Description BLOOD RIGHT ARM  Final   Special Requests   Final    BOTTLES DRAWN AEROBIC AND ANAEROBIC Blood Culture adequate volume   Culture   Final    NO GROWTH 1 DAY Performed at [facility], [address], [place], [place] [address]    Report Status PENDING  Incomplete     Labs: Basic Metabolic Panel: Recent Labs  Lab 10/10/17 0551 10/10/17 1704 10/11/17 0642  NA 131*  --  137  K 2.7*  --  3.8  CL 92*  --  99  CO2 29  --  27  GLUCOSE 123*  --  96  BUN 18  --  25*  CREATININE 3.88*  --  4.47*  CALCIUM 7.7*  --  8.1*  MG  --  1.8  --    Liver Function Tests: Recent Labs  Lab 10/10/17 0551  AST 22  ALT 11  ALKPHOS 93  BILITOT 0.5  PROT 6.2*  ALBUMIN 2.5*   No results for input(s): LIPASE, AMYLASE in the last 168 hours. No results for input(s): AMMONIA in the last 168 hours. CBC: Recent Labs  Lab 10/10/17 0551 10/11/17 0642  WBC 8.1 8.1  NEUTROABS 6.8  --   HGB 9.5* 9.9*  HCT 28.4* 30.7*  MCV 98.6 100.0  PLT 230 260   Cardiac Enzymes: No results for input(s): CKTOTAL, CKMB, CKMBINDEX, TROPONINI in the last 168 hours. BNP: BNP (last 3 results) Recent Labs    02/25/17 1229  BNP 110.0*    ProBNP (last 3 results) No results for input(s): PROBNP in the last 8760 hours.  CBG: No results for input(s): GLUCAP in the last 168 hours.     Signed:  Lelon Frohlich  [facility] Pager: [phone] 10/11/2017, 11:24 AM

## 2017-10-12 ENCOUNTER — Ambulatory Visit (INDEPENDENT_AMBULATORY_CARE_PROVIDER_SITE_OTHER): Payer: 59 | Admitting: Physician Assistant

## 2017-10-12 ENCOUNTER — Inpatient Hospital Stay (HOSPITAL_COMMUNITY)
Admission: AD | Admit: 2017-10-12 | Discharge: 2017-10-21 | DRG: 907 | Disposition: A | Discharge status: Home Health | Payer: 59 | Source: Ambulatory Visit | Attending: Surgery | Admitting: Surgery

## 2017-10-12 ENCOUNTER — Encounter (HOSPITAL_COMMUNITY): Payer: Self-pay | Admitting: General Practice

## 2017-10-12 ENCOUNTER — Other Ambulatory Visit: Payer: Self-pay

## 2017-10-12 ENCOUNTER — Ambulatory Visit: Payer: Medicaid Other

## 2017-10-12 VITALS — BP 158/87 | HR 83 | Temp 99.4°F | Resp 18 | Ht 76.0 in | Wt 202.0 lb

## 2017-10-12 DIAGNOSIS — Z992 Dependence on renal dialysis: Secondary | ICD-10-CM | POA: Diagnosis not present

## 2017-10-12 DIAGNOSIS — E43 Unspecified severe protein-calorie malnutrition: Secondary | ICD-10-CM | POA: Diagnosis present

## 2017-10-12 DIAGNOSIS — L02211 Cutaneous abscess of abdominal wall: Secondary | ICD-10-CM | POA: Diagnosis present

## 2017-10-12 DIAGNOSIS — Z8619 Personal history of other infectious and parasitic diseases: Secondary | ICD-10-CM

## 2017-10-12 DIAGNOSIS — E785 Hyperlipidemia, unspecified: Secondary | ICD-10-CM | POA: Diagnosis present

## 2017-10-12 DIAGNOSIS — I120 Hypertensive chronic kidney disease with stage 5 chronic kidney disease or end stage renal disease: Secondary | ICD-10-CM | POA: Diagnosis present

## 2017-10-12 DIAGNOSIS — N4889 Other specified disorders of penis: Secondary | ICD-10-CM | POA: Diagnosis present

## 2017-10-12 DIAGNOSIS — I700 Atherosclerosis of aorta: Secondary | ICD-10-CM | POA: Diagnosis present

## 2017-10-12 DIAGNOSIS — B9689 Other specified bacterial agents as the cause of diseases classified elsewhere: Secondary | ICD-10-CM | POA: Diagnosis present

## 2017-10-12 DIAGNOSIS — I739 Peripheral vascular disease, unspecified: Secondary | ICD-10-CM | POA: Diagnosis present

## 2017-10-12 DIAGNOSIS — B952 Enterococcus as the cause of diseases classified elsewhere: Secondary | ICD-10-CM | POA: Diagnosis present

## 2017-10-12 DIAGNOSIS — Q613 Polycystic kidney, unspecified: Secondary | ICD-10-CM | POA: Diagnosis not present

## 2017-10-12 DIAGNOSIS — N186 End stage renal disease: Secondary | ICD-10-CM | POA: Diagnosis present

## 2017-10-12 DIAGNOSIS — N5089 Other specified disorders of the male genital organs: Secondary | ICD-10-CM | POA: Diagnosis present

## 2017-10-12 DIAGNOSIS — D649 Anemia, unspecified: Secondary | ICD-10-CM | POA: Diagnosis present

## 2017-10-12 DIAGNOSIS — T148XXA Other injury of unspecified body region, initial encounter: Secondary | ICD-10-CM

## 2017-10-12 DIAGNOSIS — N2581 Secondary hyperparathyroidism of renal origin: Secondary | ICD-10-CM | POA: Diagnosis present

## 2017-10-12 DIAGNOSIS — Q446 Cystic disease of liver: Secondary | ICD-10-CM | POA: Diagnosis not present

## 2017-10-12 DIAGNOSIS — E8889 Other specified metabolic disorders: Secondary | ICD-10-CM | POA: Diagnosis present

## 2017-10-12 DIAGNOSIS — Z79899 Other long term (current) drug therapy: Secondary | ICD-10-CM

## 2017-10-12 DIAGNOSIS — J449 Chronic obstructive pulmonary disease, unspecified: Secondary | ICD-10-CM | POA: Diagnosis present

## 2017-10-12 DIAGNOSIS — Z6823 Body mass index (BMI) 23.0-23.9, adult: Secondary | ICD-10-CM

## 2017-10-12 DIAGNOSIS — Z7901 Long term (current) use of anticoagulants: Secondary | ICD-10-CM

## 2017-10-12 DIAGNOSIS — L089 Local infection of the skin and subcutaneous tissue, unspecified: Secondary | ICD-10-CM | POA: Diagnosis present

## 2017-10-12 DIAGNOSIS — Z87891 Personal history of nicotine dependence: Secondary | ICD-10-CM

## 2017-10-12 DIAGNOSIS — I2510 Atherosclerotic heart disease of native coronary artery without angina pectoris: Secondary | ICD-10-CM | POA: Diagnosis present

## 2017-10-12 DIAGNOSIS — G8929 Other chronic pain: Secondary | ICD-10-CM | POA: Diagnosis present

## 2017-10-12 DIAGNOSIS — I252 Old myocardial infarction: Secondary | ICD-10-CM

## 2017-10-12 DIAGNOSIS — K219 Gastro-esophageal reflux disease without esophagitis: Secondary | ICD-10-CM | POA: Diagnosis present

## 2017-10-12 DIAGNOSIS — Z95820 Peripheral vascular angioplasty status with implants and grafts: Secondary | ICD-10-CM

## 2017-10-12 DIAGNOSIS — I97648 Postprocedural seroma of a circulatory system organ or structure following other circulatory system procedure: Principal | ICD-10-CM | POA: Diagnosis present

## 2017-10-12 DIAGNOSIS — T45515S Adverse effect of anticoagulants, sequela: Secondary | ICD-10-CM | POA: Diagnosis not present

## 2017-10-12 DIAGNOSIS — S301XXA Contusion of abdominal wall, initial encounter: Secondary | ICD-10-CM | POA: Diagnosis not present

## 2017-10-12 DIAGNOSIS — T8149XA Infection following a procedure, other surgical site, initial encounter: Secondary | ICD-10-CM

## 2017-10-12 DIAGNOSIS — M549 Dorsalgia, unspecified: Secondary | ICD-10-CM | POA: Diagnosis present

## 2017-10-12 DIAGNOSIS — I480 Paroxysmal atrial fibrillation: Secondary | ICD-10-CM | POA: Diagnosis present

## 2017-10-12 DIAGNOSIS — Y838 Other surgical procedures as the cause of abnormal reaction of the patient, or of later complication, without mention of misadventure at the time of the procedure: Secondary | ICD-10-CM | POA: Diagnosis present

## 2017-10-12 DIAGNOSIS — Z981 Arthrodesis status: Secondary | ICD-10-CM

## 2017-10-12 HISTORY — DX: Dependence on renal dialysis: Z99.2

## 2017-10-12 HISTORY — DX: Acute myocardial infarction, unspecified: I21.9

## 2017-10-12 HISTORY — DX: Dependence on renal dialysis: N18.6

## 2017-10-12 LAB — BASIC METABOLIC PANEL
ANION GAP: 12 (ref 5–15)
BUN: 31 mg/dL — ABNORMAL HIGH (ref 8–23)
CHLORIDE: 92 mmol/L — AB (ref 98–111)
CO2: 27 mmol/L (ref 22–32)
CREATININE: 5.09 mg/dL — AB (ref 0.61–1.24)
Calcium: 7.9 mg/dL — ABNORMAL LOW (ref 8.9–10.3)
GFR, EST AFRICAN AMERICAN: 13 mL/min — AB (ref >60)
GFR, EST NON AFRICAN AMERICAN: 11 mL/min — AB (ref >60)
Glucose, Bld: 110 mg/dL — ABNORMAL HIGH (ref 70–99)
Potassium: 4.3 mmol/L (ref 3.5–5.1)
Sodium: 131 mmol/L — ABNORMAL LOW (ref 135–145)

## 2017-10-12 LAB — PROTIME-INR
INR: 2.17
Prothrombin Time: 24 seconds — ABNORMAL HIGH (ref 11.4–15.2)

## 2017-10-12 LAB — CBC
HEMATOCRIT: 30 % — AB (ref 39.0–52.0)
Hemoglobin: 9.6 g/dL — ABNORMAL LOW (ref 13.0–17.0)
MCH: 31.8 pg (ref 26.0–34.0)
MCHC: 32 g/dL (ref 30.0–36.0)
MCV: 99.3 fL (ref 78.0–100.0)
PLATELETS: 275 10*3/uL (ref 150–400)
RBC: 3.02 MIL/uL — AB (ref 4.22–5.81)
RDW: 14.7 % (ref 11.5–15.5)
WBC: 12.1 10*3/uL — AB (ref 4.0–10.5)

## 2017-10-12 MED ORDER — ACETAMINOPHEN 325 MG RE SUPP: 325.0000 mg | RECTAL | Status: DC | PRN

## 2017-10-12 MED ORDER — PIPERACILLIN-TAZOBACTAM 3.375 G IVPB
3.3750 g | Freq: Two times a day (BID) | INTRAVENOUS | Status: DC
  Administered 2017-10-12 – 2017-10-16 (×8): 3.375 g via INTRAVENOUS
  Filled 2017-10-12 (×8): qty 50

## 2017-10-12 MED ORDER — SODIUM CHLORIDE 0.9 % IV SOLN: 100.0000 mL | INTRAVENOUS | Status: DC | PRN

## 2017-10-12 MED ORDER — HEPARIN SODIUM (PORCINE) 1000 UNIT/ML DIALYSIS
20.0000 [IU]/kg | INTRAMUSCULAR | Status: DC | PRN
  Filled 2017-10-12: qty 2

## 2017-10-12 MED ORDER — SODIUM CHLORIDE 0.9 % IV SOLN
250.0000 mL | INTRAVENOUS | Status: DC | PRN
Start: 2017-10-12 — End: 2017-10-21
  Administered 2017-10-12: 250 mL via INTRAVENOUS

## 2017-10-12 MED ORDER — LIDOCAINE HCL (PF) 1 % IJ SOLN: 5.0000 mL | INTRAMUSCULAR | Status: DC | PRN

## 2017-10-12 MED ORDER — HEPARIN SODIUM (PORCINE) 1000 UNIT/ML DIALYSIS
2000.0000 [IU] | Freq: Once | INTRAMUSCULAR | Status: DC
  Filled 2017-10-12: qty 2

## 2017-10-12 MED ORDER — HEPARIN SODIUM (PORCINE) 1000 UNIT/ML DIALYSIS
1000.0000 [IU] | INTRAMUSCULAR | Status: DC | PRN
  Filled 2017-10-12: qty 1

## 2017-10-12 MED ORDER — ACETAMINOPHEN 325 MG PO TABS: 325.0000 mg | ORAL_TABLET | ORAL | Status: DC | PRN

## 2017-10-12 MED ORDER — LABETALOL HCL 5 MG/ML IV SOLN
10.0000 mg | INTRAVENOUS | Status: AC | PRN
  Administered 2017-10-14 – 2017-10-19 (×4): 10 mg via INTRAVENOUS
  Filled 2017-10-12 (×4): qty 4

## 2017-10-12 MED ORDER — SODIUM CHLORIDE 0.9% FLUSH
3.0000 mL | INTRAVENOUS | Status: DC | PRN
  Administered 2017-10-19: 3 mL via INTRAVENOUS
  Filled 2017-10-12: qty 3

## 2017-10-12 MED ORDER — POTASSIUM CHLORIDE CRYS ER 20 MEQ PO TBCR: 20.0000 meq | EXTENDED_RELEASE_TABLET | Freq: Once | ORAL | Status: DC

## 2017-10-12 MED ORDER — SODIUM CHLORIDE 0.9% FLUSH
3.0000 mL | Freq: Two times a day (BID) | INTRAVENOUS | Status: DC
  Administered 2017-10-12 – 2017-10-21 (×11): 3 mL via INTRAVENOUS

## 2017-10-12 MED ORDER — PENTAFLUOROPROP-TETRAFLUOROETH EX AERO: 1.0000 "application " | INHALATION_SPRAY | CUTANEOUS | Status: DC | PRN

## 2017-10-12 MED ORDER — PHENOL 1.4 % MT LIQD: 1.0000 | OROMUCOSAL | Status: DC | PRN

## 2017-10-12 MED ORDER — SODIUM CHLORIDE 0.9 % IV SOLN
2000.0000 mg | Freq: Once | INTRAVENOUS | Status: AC
  Administered 2017-10-12: 2000 mg via INTRAVENOUS
  Filled 2017-10-12: qty 2000

## 2017-10-12 MED ORDER — METOPROLOL TARTRATE 5 MG/5ML IV SOLN
2.0000 mg | INTRAVENOUS | Status: AC | PRN
  Administered 2017-10-15 (×2): 5 mg via INTRAVENOUS
  Filled 2017-10-12 (×2): qty 5

## 2017-10-12 MED ORDER — POLYETHYLENE GLYCOL 3350 17 G PO PACK: 17.0000 g | PACK | Freq: Every day | ORAL | Status: DC | PRN

## 2017-10-12 MED ORDER — LIDOCAINE-PRILOCAINE 2.5-2.5 % EX CREA
1.0000 "application " | TOPICAL_CREAM | CUTANEOUS | Status: DC | PRN
  Filled 2017-10-12: qty 5

## 2017-10-12 MED ORDER — ONDANSETRON HCL 4 MG/2ML IJ SOLN
4.0000 mg | Freq: Four times a day (QID) | INTRAMUSCULAR | Status: DC | PRN
  Filled 2017-10-12: qty 2

## 2017-10-12 MED ORDER — HYDRALAZINE HCL 20 MG/ML IJ SOLN
5.0000 mg | INTRAMUSCULAR | Status: DC | PRN
  Administered 2017-10-17: 5 mg via INTRAVENOUS
  Filled 2017-10-12: qty 1

## 2017-10-12 MED ORDER — MORPHINE SULFATE (PF) 2 MG/ML IV SOLN
2.0000 mg | INTRAVENOUS | Status: DC | PRN
  Administered 2017-10-12 – 2017-10-18 (×20): 2 mg via INTRAVENOUS
  Filled 2017-10-12 (×21): qty 1

## 2017-10-12 MED ORDER — GUAIFENESIN-DM 100-10 MG/5ML PO SYRP: 15.0000 mL | ORAL_SOLUTION | ORAL | Status: DC | PRN

## 2017-10-12 MED ORDER — ALTEPLASE 2 MG IJ SOLR: 2.0000 mg | Freq: Once | INTRAMUSCULAR | Status: DC | PRN

## 2017-10-12 MED ORDER — PANTOPRAZOLE SODIUM 40 MG PO TBEC
40.0000 mg | DELAYED_RELEASE_TABLET | Freq: Every day | ORAL | Status: DC
  Administered 2017-10-13 – 2017-10-21 (×8): 40 mg via ORAL
  Filled 2017-10-12 (×9): qty 1

## 2017-10-12 MED ORDER — OXYCODONE-ACETAMINOPHEN 5-325 MG PO TABS
1.0000 | ORAL_TABLET | ORAL | Status: DC | PRN
  Administered 2017-10-12 – 2017-10-21 (×27): 2 via ORAL
  Filled 2017-10-12 (×28): qty 2

## 2017-10-12 MED ORDER — BISACODYL 10 MG RE SUPP: 10.0000 mg | Freq: Every day | RECTAL | Status: DC | PRN

## 2017-10-12 NOTE — Progress Notes (Signed)
Pharmacy Antibiotic Note  Aaron [name] is a 62 y.o. male who is s/p right iliac stent, right femoral endarterectomy, and right SFA stent on 08/14/17. He is  admitted on 10/12/2017 with right groin wound infection.  Recently admitted to [facility] for rectus sheath hematoma and discharged yesterday 10/11/17.  Pharmacy has been consulted for Vancomycin and Zosyn dosing for right groin wound infection. Renal:  ESRD, on HD qTTSat S/p I&D right groin lymphocele and placement of vac dressing  On 09/21/17. S/p I&D of wound on 10/02/17   Plan: Vancomycin 2 g IV x1 then F/u HD schedule & order post HD Vancomycin doses.  Zosyn 3.375 g IV q12h (4hour infusion)  Monitor clinical status, culture results if done and pre HD vancomycin level per protocol.  Temp (24hrs), Avg:99.3 F (37.4 C), Min:99.2 F (37.3 C), Max:99.4 F (37.4 C)  Recent Labs  Lab 10/10/17 0551 10/11/17 0642  WBC 8.1 8.1  CREATININE 3.88* 4.47*  LATICACIDVEN 1.14  --     Estimated Creatinine Clearance: 21.3 mL/min (A) (by C-G formula based on SCr of 4.47 mg/dL (H)).    No Known Allergies  Antimicrobials this admission: Vanc 7/1>> Zosyn 7/1>>  Dose adjustments this admission:   Microbiology results: None   Thank you for allowing pharmacy to be a part of this patient's care.  Nicole Cella, RPh Clinical Pharmacist See Amion for Pharmacist phone # After 10PM call [place] [phone] 10/12/2017 7:02 PM

## 2017-10-12 NOTE — Progress Notes (Signed)
POST OPERATIVE OFFICE NOTE    CC:  F/u for surgery  HPI:  This is a 62 y.o. male who is s/p right iliac stent, right femoral endarterectomy, and right SFA stent by Dr. Trula Slade on 08/14/17.  He developed swelling in his right groin that measured 2 x 6 cm by CT scan.  This was thought to be a hematoma and was being managed conservatively.  It slowly increased in size.  He was taken to the operating room on 09/21/17 and underwent I&D of the right groin lymphocele and placement of vac dressing by Dr. Oneida Alar.  The lymphocele cavity was penetrating all the way down to the level of the bovine pericardial patch.  Approximately 50cc of fluid was evacuated.   He presented back to the office on 10/02/17 and saw Dr. Donzetta Matters for increasing pain and swelling.  The wound had healed over the cavity and this was open and there was significant serous fluid drained that relieved his pain.    He was recently admitted to [facility] with swelling and pain on the right side of his abdomen.  He had a CT scan that revealed a RLQ rectus sheath intramuscular hematoma.  There was also an upper right thigh fluid collection just superficial to the femoral bifurcation with sinus tract extending to the skin surface at the site of the wound vac.  He was admitted for further treatment on 10/10/17 and discharged on 10/11/17.  He was advised to hold his coumadin and Plavix.  The also recommended holding off on the wound vac as well.  He presents today for follow up.  He states that his stomach just started swelling up.  States that it turned red and was warm to touch.  He denies any fever or chills at home but he does have a low grade fever here today.  He states he has not been on any abx.   He states that the swelling has moved back down to his penis.  He states that the tip split  And he has been using a salve that has helped it heal.  He denies any burning with urination. He has been off his coumadin and Plavix since Thursday, October 08, 2017   No Known Allergies  Current Outpatient Medications  Medication Sig Dispense Refill  . B Complex-C-Folic Acid (DIALYVITE 829) 0.8 MG TABS Take 1 tablet by mouth daily.  3  . BREO ELLIPTA 200-25 MCG/INH AEPB Inhale 1 puff into the lungs daily as needed (wheezing).     . calcitRIOL (ROCALTROL) 0.25 MCG capsule Take 1 capsule (0.25 mcg total) by mouth Every Tuesday,Thursday,and Saturday with dialysis. (Patient not taking: Reported on 10/10/2017) 30 capsule 0  . calcium acetate (PHOSLO) 667 MG capsule Take 667 mg by mouth daily.  3  . carvedilol (COREG) 25 MG tablet Take 1 tablet (25 mg total) 2 (two) times daily by mouth. 180 tablet 3  . cholecalciferol (VITAMIN D) 1000 units tablet Take 2,000 Units by mouth daily.    Marland Kitchen oxyCODONE-acetaminophen (PERCOCET/ROXICET) 5-325 MG tablet Take 1 tablet by mouth every 6 (six) hours as needed. 20 tablet 0  . pantoprazole (PROTONIX) 40 MG tablet Take 1 tablet (40 mg total) by mouth 2 (two) times daily. 60 tablet 1   No current facility-administered medications for this visit.      ROS:  See HPI  Physical Exam:  Vitals:   10/12/17 1421 10/12/17 1425  BP: (!) 189/92 (!) 158/87  Pulse: 82 83  Resp: 18   Temp: 99.4 F (37.4 C)    Vitals:   10/12/17 1421  Weight: 202 lb (91.6 kg)  Height: 6\' 4"  (1.93 m)   Body mass index is 24.59 kg/m.   Incision:  Incision in the right groin is healing, however, there is erythema around his right groin and RLQ of his abdomen.   He is non tender in his right groin and tolerated probing the wound.  Unable to express any drainage.  There is some bloody drainage due to the probing.  Abdomen:  Abdomen is firm in the RLQ and significant erythema present   CT abdomen/pelvis 10/10/17: IMPRESSION: 1. Upper right thigh fluid collection just superficial to the femoral bifurcation with sinus tract extending to the skin surface at the site of the wound VAC. This is new from the prior exam. 2. Right lower quadrant  rectus sheath intramuscular hematoma. 3.  Aortic Atherosclerosis (ICD10-I70.0). 4. Polycystic liver and kidney disease.   Assessment/Plan:  This is a 62 y.o. male who is s/p: right iliac stent, right femoral endarterectomy, and right SFA stent by Dr. Trula Slade on 08/14/17, I&D of the right groin lymphocele and placement of vac dressing by Dr. Oneida Alar on 09/21/17 and I&D of wound by Dr. Donzetta Matters on 10/02/17 recently admitted to [facility] for rectus sheath hematoma and discharged yesterday.   -pt's wound itself in the right groin is clean, but there is significant erythema around his wound onto his thigh and also present in the RLQ of his abdomen.  His RLQ is firm.  -he has a low grade temp today in the office.  Dr. Trula Slade probed the wound and there was no fluid present (some bloody fluid due to the probing).   -will direct admit to the hospital and start on IV abx.  He may need I&D again of the right groin but will start with abx.   There are no beds available at this time, but are awaiting discharges.  He does need to go home and get some things.  The hospital will contact him on his cell phone.  He knows to go to admitting. -I will also contact renal to let them know of his admission.  -He has also been off of his coumadin and plavix since Thursday, October 08, 2017.  Will use SCD's for DVT prophylaxis.  -scrotal and penile swelling-suggested pt elevate scrotum on blanket or pillow   Leontine Locket, PA-C [facility] [phone]  Clinic MD:  Pt seen and examined with Dr. Trula Slade

## 2017-10-13 ENCOUNTER — Inpatient Hospital Stay (HOSPITAL_COMMUNITY): Payer: 59

## 2017-10-13 LAB — RENAL FUNCTION PANEL
ALBUMIN: 2 g/dL — AB (ref 3.5–5.0)
Anion gap: 11 (ref 5–15)
BUN: 34 mg/dL — ABNORMAL HIGH (ref 8–23)
CO2: 25 mmol/L (ref 22–32)
CREATININE: 5.2 mg/dL — AB (ref 0.61–1.24)
Calcium: 7.9 mg/dL — ABNORMAL LOW (ref 8.9–10.3)
Chloride: 97 mmol/L — ABNORMAL LOW (ref 98–111)
GFR calc Af Amer: 13 mL/min — ABNORMAL LOW (ref >60)
GFR, EST NON AFRICAN AMERICAN: 11 mL/min — AB (ref >60)
GLUCOSE: 102 mg/dL — AB (ref 70–99)
Phosphorus: 3.5 mg/dL (ref 2.5–4.6)
Potassium: 4.1 mmol/L (ref 3.5–5.1)
Sodium: 133 mmol/L — ABNORMAL LOW (ref 135–145)

## 2017-10-13 LAB — CBC
HCT: 29.3 % — ABNORMAL LOW (ref 39.0–52.0)
Hemoglobin: 9.4 g/dL — ABNORMAL LOW (ref 13.0–17.0)
MCH: 32.2 pg (ref 26.0–34.0)
MCHC: 32.1 g/dL (ref 30.0–36.0)
MCV: 100.3 fL — ABNORMAL HIGH (ref 78.0–100.0)
PLATELETS: 285 10*3/uL (ref 150–400)
RBC: 2.92 MIL/uL — ABNORMAL LOW (ref 4.22–5.81)
RDW: 14.9 % (ref 11.5–15.5)
WBC: 12.4 10*3/uL — AB (ref 4.0–10.5)

## 2017-10-13 MED ORDER — SODIUM CHLORIDE 0.9 % IV SOLN: 100.0000 mL | INTRAVENOUS | Status: DC | PRN

## 2017-10-13 MED ORDER — VANCOMYCIN HCL IN DEXTROSE 1-5 GM/200ML-% IV SOLN
1000.0000 mg | INTRAVENOUS | Status: DC
  Administered 2017-10-15: 1000 mg via INTRAVENOUS
  Filled 2017-10-13 (×2): qty 200

## 2017-10-13 MED ORDER — DARBEPOETIN ALFA 60 MCG/0.3ML IJ SOSY
60.0000 ug | PREFILLED_SYRINGE | INTRAMUSCULAR | Status: DC
  Administered 2017-10-20: 60 ug via INTRAVENOUS
  Filled 2017-10-13 (×2): qty 0.3

## 2017-10-13 MED ORDER — HEPARIN SODIUM (PORCINE) 1000 UNIT/ML DIALYSIS: 1000.0000 [IU] | INTRAMUSCULAR | Status: DC | PRN

## 2017-10-13 MED ORDER — CALCIUM ACETATE (PHOS BINDER) 667 MG PO CAPS
667.0000 mg | ORAL_CAPSULE | Freq: Three times a day (TID) | ORAL | Status: DC
  Administered 2017-10-13 – 2017-10-21 (×19): 667 mg via ORAL
  Filled 2017-10-13 (×18): qty 1

## 2017-10-13 MED ORDER — CHLORHEXIDINE GLUCONATE CLOTH 2 % EX PADS
6.0000 | MEDICATED_PAD | Freq: Every day | CUTANEOUS | Status: DC
  Administered 2017-10-13 – 2017-10-19 (×4): 6 via TOPICAL

## 2017-10-13 MED ORDER — RENA-VITE PO TABS
1.0000 | ORAL_TABLET | Freq: Every day | ORAL | Status: DC
  Administered 2017-10-13 – 2017-10-20 (×8): 1 via ORAL
  Filled 2017-10-13 (×8): qty 1

## 2017-10-13 MED ORDER — VANCOMYCIN HCL IN DEXTROSE 1-5 GM/200ML-% IV SOLN
INTRAVENOUS | Status: AC
  Administered 2017-10-13: 1000 mg
  Filled 2017-10-13: qty 200

## 2017-10-13 MED ORDER — CALCITRIOL 0.25 MCG PO CAPS
ORAL_CAPSULE | ORAL | Status: AC
  Administered 2017-10-13: 0.25 ug
  Filled 2017-10-13: qty 1

## 2017-10-13 MED ORDER — CALCITRIOL 0.25 MCG PO CAPS
0.2500 ug | ORAL_CAPSULE | ORAL | Status: DC
  Administered 2017-10-15 – 2017-10-20 (×3): 0.25 ug via ORAL
  Filled 2017-10-13 (×2): qty 1

## 2017-10-13 MED ORDER — DARBEPOETIN ALFA 60 MCG/0.3ML IJ SOSY
PREFILLED_SYRINGE | INTRAMUSCULAR | Status: AC
  Administered 2017-10-13: 60 ug
  Filled 2017-10-13: qty 0.3

## 2017-10-13 NOTE — H&P (Signed)
POST OPERATIVE OFFICE NOTE    CC:  F/u for surgery  HPI:  This is a 62 y.o. male who is s/p right iliac stent, right femoral endarterectomy, and right SFA stent by Dr. Trula Slade on 08/14/17.  He developed swelling in his right groin that measured 2 x 6 cm by CT scan.  This was thought to be a hematoma and was being managed conservatively.  It slowly increased in size.  He was taken to the operating room on 09/21/17 and underwent I&D of the right groin lymphocele and placement of vac dressing by Dr. Oneida Alar.  The lymphocele cavity was penetrating all the way down to the level of the bovine pericardial patch.  Approximately 50cc of fluid was evacuated.   He presented back to the office on 10/02/17 and saw Dr. Donzetta Matters for increasing pain and swelling.  The wound had healed over the cavity and this was open and there was significant serous fluid drained that relieved his pain.    He was recently admitted to [facility] with swelling and pain on the right side of his abdomen.  He had a CT scan that revealed a RLQ rectus sheath intramuscular hematoma.  There was also an upper right thigh fluid collection just superficial to the femoral bifurcation with sinus tract extending to the skin surface at the site of the wound vac.  He was admitted for further treatment on 10/10/17 and discharged on 10/11/17.  He was advised to hold his coumadin and Plavix.  The also recommended holding off on the wound vac as well.  He presents today for follow up.  He states that his stomach just started swelling up.  States that it turned red and was warm to touch.  He denies any fever or chills at home but he does have a low grade fever here today.  He states he has not been on any abx.   He states that the swelling has moved back down to his penis.  He states that the tip split  And he has been using a salve that has helped it heal.  He denies any burning with urination. He has been off his coumadin and Plavix since Thursday,  October 08, 2017   No Known Allergies        Current Outpatient Medications  Medication Sig Dispense Refill  . B Complex-C-Folic Acid (DIALYVITE 254) 0.8 MG TABS Take 1 tablet by mouth daily.  3  . BREO ELLIPTA 200-25 MCG/INH AEPB Inhale 1 puff into the lungs daily as needed (wheezing).     . calcitRIOL (ROCALTROL) 0.25 MCG capsule Take 1 capsule (0.25 mcg total) by mouth Every Tuesday,Thursday,and Saturday with dialysis. (Patient not taking: Reported on 10/10/2017) 30 capsule 0  . calcium acetate (PHOSLO) 667 MG capsule Take 667 mg by mouth daily.  3  . carvedilol (COREG) 25 MG tablet Take 1 tablet (25 mg total) 2 (two) times daily by mouth. 180 tablet 3  . cholecalciferol (VITAMIN D) 1000 units tablet Take 2,000 Units by mouth daily.    Marland Kitchen oxyCODONE-acetaminophen (PERCOCET/ROXICET) 5-325 MG tablet Take 1 tablet by mouth every 6 (six) hours as needed. 20 tablet 0  . pantoprazole (PROTONIX) 40 MG tablet Take 1 tablet (40 mg total) by mouth 2 (two) times daily. 60 tablet 1   No current facility-administered medications for this visit.      ROS:  See HPI  Physical Exam:      Vitals:   10/12/17 1421 10/12/17 1425  BP: (!) 189/92 (!) 158/87  Pulse: 82 83  Resp: 18   Temp: 99.4 F (37.4 C)       Vitals:   10/12/17 1421  Weight: 202 lb (91.6 kg)  Height: 6\' 4"  (1.93 m)   Body mass index is 24.59 kg/m.   Incision:  Incision in the right groin is healing, however, there is erythema around his right groin and RLQ of his abdomen.   He is non tender in his right groin and tolerated probing the wound.  Unable to express any drainage.  There is some bloody drainage due to the probing.  Abdomen:  Abdomen is firm in the RLQ and significant erythema present   CT abdomen/pelvis 10/10/17: IMPRESSION: 1. Upper right thigh fluid collection just superficial to the femoral bifurcation with sinus tract extending to the skin surface at the site of the wound VAC. This is  new from the prior exam. 2. Right lower quadrant rectus sheath intramuscular hematoma. 3. Aortic Atherosclerosis (ICD10-I70.0). 4. Polycystic liver and kidney disease.   Assessment/Plan:  This is a 62 y.o. male who is s/p: right iliac stent, right femoral endarterectomy, and right SFA stent by Dr. Trula Slade on 08/14/17, I&D of the right groin lymphocele and placement of vac dressing by Dr. Oneida Alar on 09/21/17 and I&D of wound by Dr. Donzetta Matters on 10/02/17 recently admitted to [facility] for rectus sheath hematoma and discharged yesterday.   -pt's wound itself in the right groin is clean, but there is significant erythema around his wound onto his thigh and also present in the RLQ of his abdomen.  His RLQ is firm.  -he has a low grade temp today in the office.  Dr. Trula Slade probed the wound and there was no fluid present (some bloody fluid due to the probing).   -will direct admit to the hospital and start on IV abx.  He may need I&D again of the right groin but will start with abx.   There are no beds available at this time, but are awaiting discharges.  He does need to go home and get some things.  The hospital will contact him on his cell phone.  He knows to go to admitting. -I will also contact renal to let them know of his admission.  -He has also been off of his coumadin and plavix since Thursday, October 08, 2017.  Will use SCD's for DVT prophylaxis.  -scrotal and penile swelling-suggested pt elevate scrotum on blanket or pillow   Leontine Locket, PA-C [facility] [phone]  Clinic MD:  Pt seen and examined with Dr. Trula Slade

## 2017-10-13 NOTE — Progress Notes (Addendum)
  Progress Note    10/13/2017 7:46 AM   Subjective:  Says he is sore; says the scrotal swelling is a little better but still swollen.  Tm 99.6 now afebrile  Vitals:   10/12/17 1928 10/13/17 0518  BP: (!) 155/99 (!) 161/82  Pulse: 84 77  Resp: 17 18  Temp: 99.6 F (37.6 C) 98.8 F (37.1 C)  SpO2: 98% 97%    Physical Exam: Cardiac:  regular Lungs:  Non labored Extremities:  Easily palpable DP pulses bilaterally Abdomen:  Erythema right abdomen slightly improved from yesterday.  CBC    Component Value Date/Time   WBC 12.1 (H) 10/12/2017 1906   RBC 3.02 (L) 10/12/2017 1906   HGB 9.6 (L) 10/12/2017 1906   HCT 30.0 (L) 10/12/2017 1906   PLT 275 10/12/2017 1906   MCV 99.3 10/12/2017 1906   MCH 31.8 10/12/2017 1906   MCHC 32.0 10/12/2017 1906   RDW 14.7 10/12/2017 1906   LYMPHSABS 0.4 (L) 10/10/2017 0551   MONOABS 0.9 10/10/2017 0551   EOSABS 0.0 10/10/2017 0551   BASOSABS 0.0 10/10/2017 0551    BMET    Component Value Date/Time   NA 131 (L) 10/12/2017 1906   K 4.3 10/12/2017 1906   CL 92 (L) 10/12/2017 1906   CO2 27 10/12/2017 1906   GLUCOSE 110 (H) 10/12/2017 1906   BUN 31 (H) 10/12/2017 1906   CREATININE 5.09 (H) 10/12/2017 1906   CALCIUM 7.9 (L) 10/12/2017 1906   GFRNONAA 11 (L) 10/12/2017 1906   GFRAA 13 (L) 10/12/2017 1906    INR    Component Value Date/Time   INR 2.17 10/12/2017 1906     Intake/Output Summary (Last 24 hours) at 10/13/2017 0746 Last data filed at 10/13/2017 0550 Gross per 24 hour  Intake 730.3 ml  Output -  Net 730.3 ml     Assessment:  62 y.o. male is s/p:  right iliac stent, right femoral endarterectomy, and right SFA stent by Dr. Trula Slade on 08/14/17.  He developed swelling in his right groin that measured 2 x 6 cm by CT scan.  This was thought to be a hematoma and was being managed conservatively.  It slowly increased in size.  He was taken to the operating room on 09/21/17 and underwent I&D of the right groin lymphocele and  placement of vac dressing by Dr. Oneida Alar.  The lymphocele cavity was penetrating all the way down to the level of the bovine pericardial patch.  Approximately 50cc of fluid was evacuated.   He presented back to the office on 10/02/17 and saw Dr. Donzetta Matters for increasing pain and swelling.  The wound had healed over the cavity and this was open and there was significant serous fluid drained that relieved his pain.     Plan: -pt's erythema on right sided abdomen is improved from yesterday afternoon.  Continue abx.  Leukocytosis & low grade fever.   -renal was notified of pt's admission -DVT prophylaxis:  SCD's for DVT prophylaxis    Leontine Locket, PA-C [facility] [phone] 10/13/2017 7:46 AM  Agree with above.  Erythema improved.  Will repeat CT today.  NPO after midnight incase surgical exploration required.  [name]

## 2017-10-13 NOTE — Consult Note (Signed)
[facility] Renal Consultation Note    Indication for Consultation:  Management of ESRD/hemodialysis; anemia, hypertension/volume and secondary hyperparathyroidism  HPI: Aaron [name] is a 62 y.o. 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 HD initiated 09/01/17 during previous [facility] admission. PMH also significant for HTN, PAD (s/p femoral endarertectomy/stenting), Hx MI (no cardiac stents), former substance abuse, COPD, P A-fib (on warfarin), Hep C neg viral load.    Earlier this month he was admitted with R groin seroma that developed after stenting procedure in May-underwent I&D and had wound vac placed. Last week noted increased pain and swelling in right side of abdomen and was admitted at [facility] 6/29 with rectus sheath hematoma seen on CT. His Plavix and Coumadin were held and he was to follow up vascular surgery. Seen by VVS yesterday with worsening swelling and erythema in RLQ. He was admitted for further evaluation and antibiotics.   Vitals stable this morning. Labs Na 131, K 4.3, Glucose 110, Ca 7.9, WBC 12.1, Hgb 9.6. Blood cultures drawn at [facility] 6/29 negative to date.  IV Vanc/Zosyn started.   This morning feels better than he did yesterday. Has some painful "knots" RLQ but abdominal pain improving. Denies chills, CP, SOB, nausea, vomiting  Dialyzes at [facility] TTS. Last HD was 6/28. Completed full treatment and left 0.5kg below his dry weight. Dialyzes via Home. Had 1st stage BVF placed 09/04/17.   Past Medical History:  Diagnosis Date  . Anxiety   . Arthritis    knees , back , shoulders (10/12/2017)  . CAD (coronary artery disease)    Mild nonobstructive disease at cardiac catheterization 2002  . Chronic back pain    "back of my neck; down thru my legs" (10/12/2017)  . COPD (chronic obstructive pulmonary disease) ([place])   . Diverticulitis   . Esophageal reflux   . ESRD (end stage renal disease) on dialysis ([place])    "TTS; [facility]" (10/12/2017)  . Essential  hypertension   . Hepatitis C    states he no longer has this  . History of kidney stones   . History of syncope   . Hyperlipidemia   . Myocardial infarction ([place]) 02/2017   "light one" (10/12/2017)  . PAT (paroxysmal atrial tachycardia) ([place])   . Peripheral arterial disease ([place])    Occluded left superficial femoral artery status post stent June 2016 - Dr. Trula Slade  . Pneumonia 1961  . Polycystic kidney, unspecified type   . Syncope 09/2014   Past Surgical History:  Procedure Laterality Date  . ABDOMINAL AORTOGRAM N/A 08/11/2017   Procedure: ABDOMINAL AORTOGRAM;  Surgeon: Serafina Mitchell, MD;  Location: [place] [facility];  Service: Cardiovascular;  Laterality: N/A;  . ANTERIOR CERVICAL DECOMP/DISCECTOMY FUSION    . APPLICATION OF WOUND VAC Right 08/14/2017   Procedure: APPLICATION OF PREVENA INCISIONAL WOUND VAC RIGHT GROIN;  Surgeon: Serafina Mitchell, MD;  Location: [facility];  Service: Vascular;  Laterality: Right;  . AV FISTULA PLACEMENT Left 09/04/2017   Procedure: ARTERIOVENOUS (AV) FISTULA CREATION LEFT UPPER ARM;  Surgeon: Serafina Mitchell, MD;  Location: [place];  Service: Vascular;  Laterality: Left;  . BACK SURGERY    . BIOPSY  12/17/2016   Procedure: BIOPSY;  Surgeon: Daneil Dolin, MD;  Location: [facility];  Service: Gastroenterology;;  gastric colon  . BIOPSY  04/29/2017   Procedure: BIOPSY;  Surgeon: Daneil Dolin, MD;  [facility];  Service: Endoscopy;;  duodenal biopsies  . BUNIONECTOMY  Bilateral   . COLONOSCOPY  2008   Dr. Oneida Alar: rare sigmoid colon diverticulosis, internal hemorrhoids.   . COLONOSCOPY WITH PROPOFOL N/A 12/17/2016   dense left-sided diverticulosis, right colon ulcers s/p biopsy query occult NSAID use vs transient ischemia, not consistent with IBD. CMV stains negative.   Marland Kitchen ENDARTERECTOMY FEMORAL Right 08/14/2017   Procedure: RIGHT ILLIO-FEMORAL ENDARTERECTOMY;  Surgeon: Serafina Mitchell, MD;  Location: [facility] OR;  Service: Vascular;  Laterality:  Right;  . ESOPHAGEAL DILATION  12/17/2016   EGD with mild Schatzki's ring s/p dilatation, small hiatal hernia, erosive gastropathy (negative H.pylori gastritis)  . ESOPHAGOGASTRODUODENOSCOPY  2008   Dr. Oneida Alar: normal esophagus without Barrett's, antritis and duodenitis, path with H.pylori gastritis  . ESOPHAGOGASTRODUODENOSCOPY (EGD) WITH PROPOFOL N/A 12/17/2016   Procedure: ESOPHAGOGASTRODUODENOSCOPY (EGD) WITH PROPOFOL;  Surgeon: Daneil Dolin, MD;  [facility];  Service: Gastroenterology;  Laterality: N/A;  . ESOPHAGOGASTRODUODENOSCOPY (EGD) WITH PROPOFOL N/A 04/29/2017   Patchy erythema of gastric mucosa diffusely, extensive inflammatory changes in duodenum, geographic ulceration and mucosal edema present, encroaching somewhat on the lumen yet still widely patent, distal second portion of duodenum appeared abnormal, path with peptic duodenitis with ulceration  . HERNIA REPAIR  6295   umbilical  . I&D EXTREMITY Right 09/21/2017   Procedure: IRRIGATION AND DEBRIDEMENT GROIN;  Surgeon: Elam Dutch, MD;  Location: [place];  Service: Vascular;  Laterality: Right;  . INGUINAL HERNIA REPAIR Right 02/2017   [place]  . INSERTION OF DIALYSIS CATHETER Right 09/04/2017   Procedure: INSERTION OF TUNNELED  DIALYSIS CATHETER - RIGHT INTERNAL JUGULAR PLACEMENT;  Surgeon: Serafina Mitchell, MD;  Location: [place];  Service: Vascular;  Laterality: Right;  . INSERTION OF ILIAC STENT Right 08/14/2017   Procedure: INSERTION OF RIGHT COMMON ILIAC STENT 34mm x 36mm x 130cm INSERTION OF RIGHT EXTERNAL ILIAC STENT 49mm x 33mm x 130cm INSERTION OF SUPERFICIAL FERMORAL ARTERY STENT 44mm x 69mm x 130cm;  Surgeon: Serafina Mitchell, MD;  [facility];  Service: Vascular;  Laterality: Right;  . IR FLUORO GUIDE CV LINE RIGHT  08/31/2017  . IR US GUIDE VASC ACCESS RIGHT  08/31/2017  . LOWER EXTREMITY ANGIOGRAPHY Right 08/11/2017   Procedure: LOWER EXTREMITY ANGIOGRAPHY;  Surgeon: Serafina Mitchell, MD;  Location: [place] [facility];  Service: Cardiovascular;  Laterality: Right;  . PATCH ANGIOPLASTY Right 08/14/2017   Procedure: PATCH ANGIOPLASTY USING HEMASHIELD PATCH 0.3IN Lillie Columbia;  Surgeon: Serafina Mitchell, MD;  Location[facility];  Service: Vascular;  Laterality: Right;  . PERIPHERAL VASCULAR CATHETERIZATION N/A 09/20/2014   Procedure: Abdominal Aortogram;  Surgeon: Serafina Mitchell, MD;  Location: [place] [facility];  Service: Cardiovascular;  Laterality: N/A;  . POSTERIOR FUSION LUMBAR SPINE     Family History  Problem Relation Age of Onset  . Alcoholism Mother   . Heart disease Father        Massive heart attack  . Heart attack Father   . Atrial fibrillation Father   . Colon cancer Father   . Colon cancer Maternal Grandfather 76  . Alcoholism Maternal Grandfather   . Renal cancer Cousin   . Ovarian cancer Sister    Social History:  reports that he quit smoking about 2 months ago. His smoking use included cigarettes. He has a 94.00 pack-year smoking history. He has never used smokeless tobacco. He reports that he drank alcohol. He reports that he has current or past drug history. No Known Allergies Prior to Admission medications  Medication Sig Start Date End Date Taking? Authorizing Provider  B Complex-C-Folic Acid (DIALYVITE 176) 0.8 MG TABS Take 1 tablet by mouth daily. 09/15/17   [provider]  BREO ELLIPTA 200-25 MCG/INH AEPB Inhale 1 puff into the lungs daily as needed (wheezing).  07/27/17   [provider]  calcitRIOL (ROCALTROL) 0.25 MCG capsule Take 1 capsule (0.25 mcg total) by mouth Every Tuesday,Thursday,and Saturday with dialysis. 09/08/17   Ghimire, Henreitta Leber, MD  calcium acetate (PHOSLO) 667 MG capsule Take 667 mg by mouth daily. 09/15/17   [provider]  carvedilol (COREG) 25 MG tablet Take 1 tablet (25 mg total) 2 (two) times daily by mouth. 02/25/17 10/10/17  Imogene Burn, PA-C  cholecalciferol (VITAMIN D) 1000 units tablet Take 2,000 Units by mouth  daily.    [provider]  oxyCODONE-acetaminophen (PERCOCET/ROXICET) 5-325 MG tablet Take 1 tablet by mouth every 6 (six) hours as needed. 09/23/17   Ulyses Amor, PA-C  pantoprazole (PROTONIX) 40 MG tablet Take 1 tablet (40 mg total) by mouth 2 (two) times daily. 04/30/17 10/10/17  Heath Lark D, DO   Current Facility-Administered Medications  Medication Dose Route Frequency Provider Last Rate Last Dose  . 0.9 %  sodium chloride infusion  250 mL Intravenous PRN Rhyne, Hulen Shouts, PA-C 10 mL/hr at 10/13/17 0001    . 0.9 %  sodium chloride infusion  100 mL Intravenous PRN Skyler Carel, Thomos Lemons, PA-C      . 0.9 %  sodium chloride infusion  100 mL Intravenous PRN Jalyne Brodzinski, Thomos Lemons, PA-C      . acetaminophen (TYLENOL) tablet 325-650 mg  325-650 mg Oral Q4H PRN Rhyne, Samantha J, PA-C       Or  . acetaminophen (TYLENOL) suppository 325-650 mg  325-650 mg Rectal Q4H PRN Rhyne, Samantha J, PA-C      . alteplase (CATHFLO ACTIVASE) injection 2 mg  2 mg Intracatheter Once PRN Lynnda Child, PA-C      . bisacodyl (DULCOLAX) suppository 10 mg  10 mg Rectal Daily PRN Rhyne, Hulen Shouts, PA-C      . Chlorhexidine Gluconate Cloth 2 % PADS 6 each  6 each Topical Q0600 Agustina Witzke, Thomos Lemons, PA-C      . guaiFENesin-dextromethorphan (ROBITUSSIN DM) 100-10 MG/5ML syrup 15 mL  15 mL Oral Q4H PRN Rhyne, Samantha J, PA-C      . heparin injection 1,000 Units  1,000 Units Dialysis PRN Lynnda Child, PA-C      . heparin injection 1,800 Units  20 Units/kg Dialysis PRN Lynnda Child, PA-C      . heparin injection 2,000 Units  2,000 Units Dialysis Once in dialysis Roney Jaffe, MD      . hydrALAZINE (APRESOLINE) injection 5 mg  5 mg Intravenous Q20 Min PRN Rhyne, Samantha J, PA-C      . labetalol (NORMODYNE,TRANDATE) injection 10 mg  10 mg Intravenous Q10 min PRN Rhyne, Samantha J, PA-C      . lidocaine (PF) (XYLOCAINE) 1 % injection 5 mL  5 mL Intradermal PRN Lynnda Child,  PA-C      . lidocaine-prilocaine (EMLA) cream 1 application  1 application Topical PRN Lynnda Child, PA-C      . metoprolol tartrate (LOPRESSOR) injection 2-5 mg  2-5 mg Intravenous Q2H PRN Rhyne, Samantha J, PA-C      . morphine 2 MG/ML injection 2 mg  2 mg Intravenous Q2H PRN Rhyne, Samantha J, PA-C   2 mg at 10/13/17  1028  . ondansetron (ZOFRAN) injection 4 mg  4 mg Intravenous Q6H PRN Rhyne, Samantha J, PA-C      . oxyCODONE-acetaminophen (PERCOCET/ROXICET) 5-325 MG per tablet 1-2 tablet  1-2 tablet Oral Q4H PRN Rhyne, Hulen Shouts, PA-C   2 tablet at 10/12/17 1901  . pantoprazole (PROTONIX) EC tablet 40 mg  40 mg Oral Daily Rhyne, Samantha J, PA-C   40 mg at 10/13/17 1020  . pentafluoroprop-tetrafluoroeth (GEBAUERS) aerosol 1 application  1 application Topical PRN Keyion Knack, Thomos Lemons, PA-C      . phenol (CHLORASEPTIC) mouth spray 1 spray  1 spray Mouth/Throat PRN Rhyne, Samantha J, PA-C      . piperacillin-tazobactam (ZOSYN) IVPB 3.375 g  3.375 g Intravenous Q12H Serafina Mitchell, MD 12.5 mL/hr at 10/13/17 1022 3.375 g at 10/13/17 1022  . polyethylene glycol (MIRALAX / GLYCOLAX) packet 17 g  17 g Oral Daily PRN Rhyne, Samantha J, PA-C      . potassium chloride SA (K-DUR,KLOR-CON) CR tablet 20-40 mEq  20-40 mEq Oral Once Rhyne, Samantha J, PA-C      . sodium chloride flush (NS) 0.9 % injection 3 mL  3 mL Intravenous Q12H Rhyne, Samantha J, PA-C   3 mL at 10/13/17 1000  . sodium chloride flush (NS) 0.9 % injection 3 mL  3 mL Intravenous PRN Rhyne, Samantha J, PA-C       ROS: As per HPI otherwise negative.  Physical Exam: Vitals:   10/12/17 1805 10/12/17 1900 10/12/17 1928 10/13/17 0518  BP: (!) 151/96  (!) 155/99 (!) 161/82  Pulse:   84 77  Resp: 18  17 18   Temp: 99.2 F (37.3 C)  99.6 F (37.6 C) 98.8 F (37.1 C)  TempSrc: Oral  Oral Oral  SpO2:   98% 97%  Weight:  91.6 kg (202 lb)    Height:  6\' 4"  (1.93 m)       General: WDWN male sitting up in bed NAD  Head: NCAT  sclera not icteric MMM Neck: Supple. No JVD No masses Lungs: CTA bilaterally without wheezes, rales, or rhonchi. Breathing is unlabored. Heart: RRR with S1 S2 Abdomen: diffuse tenderness RLQ, firm subq nodules Lower extremities: 1+ pitting edema R>L  Neuro: A & O  X 3. Moves all extremities spontaneously. Psych:  Responds to questions appropriately with a normal affect. Dialysis Access: Retta Diones [facility]   Labs: Basic Metabolic Panel: Recent Labs  Lab 10/10/17 0551 10/11/17 0642 10/12/17 1906  NA 131* 137 131*  K 2.7* 3.8 4.3  CL 92* 99 92*  CO2 29 27 27   GLUCOSE 123* 96 110*  BUN 18 25* 31*  CREATININE 3.88* 4.47* 5.09*  CALCIUM 7.7* 8.1* 7.9*   Liver Function Tests: Recent Labs  Lab 10/10/17 0551  AST 22  ALT 11  ALKPHOS 93  BILITOT 0.5  PROT 6.2*  ALBUMIN 2.5*   No results for input(s): LIPASE, AMYLASE in the last 168 hours. No results for input(s): AMMONIA in the last 168 hours. CBC: Recent Labs  Lab 10/10/17 0551 10/11/17 0642 10/12/17 1906  WBC 8.1 8.1 12.1*  NEUTROABS 6.8  --   --   HGB 9.5* 9.9* 9.6*  HCT 28.4* 30.7* 30.0*  MCV 98.6 100.0 99.3  PLT 230 260 275   Cardiac Enzymes: No results for input(s): CKTOTAL, CKMB, CKMBINDEX, TROPONINI in the last 168 hours. CBG: No results for input(s): GLUCAP in the last 168 hours. Iron Studies: No results for input(s): IRON, TIBC, TRANSFERRIN, FERRITIN in  the last 72 hours. Studies/Results: No results found.  Dialysis Orders:  [facility] TTS 4h 180NRe 400/800 EDW 91kg 2K/2.5Ca  R IJ TDC L AVF maturing  Hep 2700 U Calcitriol 0.82mcg TIW   Assessment/Plan: 1. Rectus sheath hematoma s/p I &D right groin lymphocele on 6/10 and 6/21. IV antibiotics started VVS following  2.  ESRD -  TTS. For HD today on schedule  3. Hypertension/volume  - BP elevated with volume on exam. Challenge EDW today. Will probably need lower EDW.  4. Anemia  - Hgb 9.9>9.6. Start ESA. Aranesp 52mcg with HD today.  5. Metabolic bone disease  -  Continue calcitriol/calcium acetate binder. Follow labs  6. Nutrition - Renal diet/vitamins  7. Afib- Coumadin on hold 8. PAD  S/p right iliac stent right femoral endarterectomy and right SFA stenting 5/3   Lynnda Child PA-C [facility] Pager [phone] 10/13/2017, 10:29 AM

## 2017-10-13 NOTE — Procedures (Signed)
Patient seen on Hemodialysis. QB 400, UF goal 2.5L Treatment adjusted as needed.  Elmarie Shiley MD [facility] Office # [phone] Pager # [phone] 1:58 PM

## 2017-10-13 NOTE — Progress Notes (Signed)
Pharmacy Antibiotic Note  Aaron [name] is a 62 y.o. male who is s/p right iliac stent, right femoral endarterectomy, and right SFA stent on 08/14/17. He is here with right groin wound infection.  Pharmacy has been consulted for Vancomycin and Zosyn dosing for right groin wound infection. -WBC= 12.1, afebrile -ESRD with HD on TTS   Plan: Vancomycin 1000 mg IV post HD on TTS Zosyn 3.375 g IV q12h (4hour infusion)  Monitor clinical status, culture results if done and pre HD vancomycin level per protocol.  Temp (24hrs), Avg:99.3 F (37.4 C), Min:98.8 F (37.1 C), Max:99.6 F (37.6 C)  Recent Labs  Lab 10/10/17 0551 10/11/17 0642 10/12/17 1906  WBC 8.1 8.1 12.1*  CREATININE 3.88* 4.47* 5.09*  LATICACIDVEN 1.14  --   --     Estimated Creatinine Clearance: 18.7 mL/min (A) (by C-G formula based on SCr of 5.09 mg/dL (H)).    No Known Allergies  Antimicrobials this admission: Vanc 7/1>> Zosyn 7/1>>  Dose adjustments this admission:   Microbiology results: 6/29 blood - ngtd  Thank you for allowing pharmacy to be a part of this patient's care.  Hildred Laser, PharmD Clinical Pharmacist Please check Amion for pharmacy contact number 10/13/2017 12:08 PM

## 2017-10-14 ENCOUNTER — Encounter (HOSPITAL_COMMUNITY): Payer: Self-pay | Admitting: Anesthesiology

## 2017-10-14 ENCOUNTER — Inpatient Hospital Stay (HOSPITAL_COMMUNITY): Payer: 59 | Admitting: Anesthesiology

## 2017-10-14 ENCOUNTER — Encounter (HOSPITAL_COMMUNITY)
Admission: AD | Disposition: A | Discharge status: Home Health | Payer: Self-pay | Source: Ambulatory Visit | Attending: Surgery

## 2017-10-14 DIAGNOSIS — S301XXA Contusion of abdominal wall, initial encounter: Secondary | ICD-10-CM

## 2017-10-14 HISTORY — PX: GROIN DEBRIDEMENT: SHX5159

## 2017-10-14 LAB — CBC
HEMATOCRIT: 31 % — AB (ref 39.0–52.0)
Hemoglobin: 10.1 g/dL — ABNORMAL LOW (ref 13.0–17.0)
MCH: 32.2 pg (ref 26.0–34.0)
MCHC: 32.6 g/dL (ref 30.0–36.0)
MCV: 98.7 fL (ref 78.0–100.0)
Platelets: 321 10*3/uL (ref 150–400)
RBC: 3.14 MIL/uL — ABNORMAL LOW (ref 4.22–5.81)
RDW: 14.7 % (ref 11.5–15.5)
WBC: 13.3 10*3/uL — ABNORMAL HIGH (ref 4.0–10.5)

## 2017-10-14 LAB — BASIC METABOLIC PANEL
Anion gap: 10 (ref 5–15)
BUN: 19 mg/dL (ref 8–23)
CO2: 28 mmol/L (ref 22–32)
CREATININE: 3.54 mg/dL — AB (ref 0.61–1.24)
Calcium: 8 mg/dL — ABNORMAL LOW (ref 8.9–10.3)
Chloride: 100 mmol/L (ref 98–111)
GFR calc Af Amer: 20 mL/min — ABNORMAL LOW (ref >60)
GFR calc non Af Amer: 17 mL/min — ABNORMAL LOW (ref >60)
GLUCOSE: 112 mg/dL — AB (ref 70–99)
Potassium: 4.1 mmol/L (ref 3.5–5.1)
Sodium: 138 mmol/L (ref 135–145)

## 2017-10-14 LAB — SURGICAL PCR SCREEN
MRSA, PCR: NEGATIVE
Staphylococcus aureus: NEGATIVE

## 2017-10-14 LAB — GLUCOSE, CAPILLARY: Glucose-Capillary: 105 mg/dL — ABNORMAL HIGH (ref 70–99)

## 2017-10-14 SURGERY — DEBRIDEMENT, INGUINAL REGION
Anesthesia: General | Laterality: Right

## 2017-10-14 MED ORDER — GENTAMICIN SULFATE 40 MG/ML IJ SOLN
INTRAMUSCULAR | Status: AC
  Filled 2017-10-14: qty 4

## 2017-10-14 MED ORDER — FENTANYL CITRATE (PF) 250 MCG/5ML IJ SOLN
INTRAMUSCULAR | Status: AC
  Filled 2017-10-14: qty 5

## 2017-10-14 MED ORDER — VANCOMYCIN HCL 500 MG IV SOLR
INTRAVENOUS | Status: AC
  Filled 2017-10-14: qty 500

## 2017-10-14 MED ORDER — GLYCOPYRROLATE 0.2 MG/ML IJ SOLN
INTRAMUSCULAR | Status: DC | PRN
  Administered 2017-10-14: 0.4 mg via INTRAVENOUS

## 2017-10-14 MED ORDER — ONDANSETRON HCL 4 MG/2ML IJ SOLN
INTRAMUSCULAR | Status: DC | PRN
  Administered 2017-10-14: 4 mg via INTRAVENOUS

## 2017-10-14 MED ORDER — OXYCODONE-ACETAMINOPHEN 5-325 MG PO TABS
ORAL_TABLET | ORAL | Status: AC
  Filled 2017-10-14: qty 2

## 2017-10-14 MED ORDER — MIDAZOLAM HCL 2 MG/2ML IJ SOLN
INTRAMUSCULAR | Status: AC
  Filled 2017-10-14: qty 2

## 2017-10-14 MED ORDER — SODIUM CHLORIDE 0.9 % IR SOLN
Status: DC | PRN
  Administered 2017-10-14: 3000 mL

## 2017-10-14 MED ORDER — 0.9 % SODIUM CHLORIDE (POUR BTL) OPTIME
TOPICAL | Status: DC | PRN
  Administered 2017-10-14: 1000 mL

## 2017-10-14 MED ORDER — FENTANYL CITRATE (PF) 100 MCG/2ML IJ SOLN
25.0000 ug | INTRAMUSCULAR | Status: DC | PRN
  Administered 2017-10-14 (×2): 50 ug via INTRAVENOUS

## 2017-10-14 MED ORDER — ROCURONIUM BROMIDE 100 MG/10ML IV SOLN
INTRAVENOUS | Status: DC | PRN
  Administered 2017-10-14: 40 mg via INTRAVENOUS

## 2017-10-14 MED ORDER — PROPOFOL 10 MG/ML IV BOLUS
INTRAVENOUS | Status: DC | PRN
  Administered 2017-10-14: 40 mg via INTRAVENOUS

## 2017-10-14 MED ORDER — GENTAMICIN SULFATE 40 MG/ML IJ SOLN
INTRAMUSCULAR | Status: AC
  Filled 2017-10-14: qty 2

## 2017-10-14 MED ORDER — GENTAMICIN SULFATE 40 MG/ML IJ SOLN
INTRAMUSCULAR | Status: DC | PRN
  Administered 2017-10-14: 240 mg

## 2017-10-14 MED ORDER — FENTANYL CITRATE (PF) 100 MCG/2ML IJ SOLN
INTRAMUSCULAR | Status: AC
  Filled 2017-10-14: qty 2

## 2017-10-14 MED ORDER — SUCCINYLCHOLINE CHLORIDE 20 MG/ML IJ SOLN
INTRAMUSCULAR | Status: DC | PRN
  Administered 2017-10-14: 100 mg via INTRAVENOUS

## 2017-10-14 MED ORDER — MIDAZOLAM HCL 5 MG/5ML IJ SOLN
INTRAMUSCULAR | Status: DC | PRN
  Administered 2017-10-14: 2 mg via INTRAVENOUS

## 2017-10-14 MED ORDER — LIDOCAINE HCL (CARDIAC) PF 100 MG/5ML IV SOSY
PREFILLED_SYRINGE | INTRAVENOUS | Status: DC | PRN
  Administered 2017-10-14 (×2): 50 mg via INTRAVENOUS

## 2017-10-14 MED ORDER — VANCOMYCIN HCL 1000 MG IV SOLR
INTRAVENOUS | Status: AC
  Filled 2017-10-14: qty 1000

## 2017-10-14 MED ORDER — NEOSTIGMINE METHYLSULFATE 10 MG/10ML IV SOLN
INTRAVENOUS | Status: DC | PRN
  Administered 2017-10-14: 3 mg via INTRAVENOUS

## 2017-10-14 MED ORDER — EPHEDRINE SULFATE 50 MG/ML IJ SOLN
INTRAMUSCULAR | Status: DC | PRN
  Administered 2017-10-14 (×3): 10 mg via INTRAVENOUS

## 2017-10-14 MED ORDER — FENTANYL CITRATE (PF) 100 MCG/2ML IJ SOLN
INTRAMUSCULAR | Status: DC | PRN
  Administered 2017-10-14: 50 ug via INTRAVENOUS
  Administered 2017-10-14: 100 ug via INTRAVENOUS

## 2017-10-14 MED ORDER — VANCOMYCIN HCL 1000 MG IV SOLR
INTRAVENOUS | Status: DC | PRN
  Administered 2017-10-14: 1000 mg

## 2017-10-14 MED ORDER — SODIUM CHLORIDE 0.9 % IV SOLN
INTRAVENOUS | Status: DC
  Administered 2017-10-14: 11:00:00 via INTRAVENOUS

## 2017-10-14 MED ORDER — NEPRO/CARBSTEADY PO LIQD
237.0000 mL | Freq: Two times a day (BID) | ORAL | Status: DC
  Administered 2017-10-14 – 2017-10-21 (×5): 237 mL via ORAL

## 2017-10-14 MED ORDER — PROPOFOL 10 MG/ML IV BOLUS
INTRAVENOUS | Status: AC
  Filled 2017-10-14: qty 20

## 2017-10-14 SURGICAL SUPPLY — 33 items
BNDG GAUZE ELAST 4 BULKY (GAUZE/BANDAGES/DRESSINGS) ×4 IMPLANT
CANISTER SUCT 3000ML PPV (MISCELLANEOUS) ×3 IMPLANT
ELECT REM PT RETURN 9FT ADLT (ELECTROSURGICAL) ×3
ELECTRODE REM PT RTRN 9FT ADLT (ELECTROSURGICAL) ×1 IMPLANT
GAUZE SPONGE 4X4 12PLY STRL (GAUZE/BANDAGES/DRESSINGS) ×3 IMPLANT
GLOVE SS BIOGEL STRL SZ 7 (GLOVE) ×1 IMPLANT
GLOVE SUPERSENSE BIOGEL SZ 7 (GLOVE) ×2
GOWN STRL REUS W/ TWL LRG LVL3 (GOWN DISPOSABLE) ×3 IMPLANT
GOWN STRL REUS W/TWL LRG LVL3 (GOWN DISPOSABLE) ×9
HANDPIECE INTERPULSE COAX TIP (DISPOSABLE) ×3
KIT BASIN OR (CUSTOM PROCEDURE TRAY) ×3 IMPLANT
KIT STIMULAN RAPID CURE  10CC (Orthopedic Implant) ×2 IMPLANT
KIT STIMULAN RAPID CURE 10CC (Orthopedic Implant) IMPLANT
KIT TURNOVER KIT B (KITS) ×3 IMPLANT
NDL 18GX1X1/2 (RX/OR ONLY) (NEEDLE) IMPLANT
NEEDLE 18GX1X1/2 (RX/OR ONLY) (NEEDLE) ×3 IMPLANT
NS IRRIG 1000ML POUR BTL (IV SOLUTION) ×3 IMPLANT
PACK GENERAL/GYN (CUSTOM PROCEDURE TRAY) ×3 IMPLANT
PACK UNIVERSAL I (CUSTOM PROCEDURE TRAY) ×3 IMPLANT
PAD ABD 8X10 STRL (GAUZE/BANDAGES/DRESSINGS) ×4 IMPLANT
PAD ARMBOARD 7.5X6 YLW CONV (MISCELLANEOUS) ×6 IMPLANT
SET HNDPC FAN SPRY TIP SCT (DISPOSABLE) IMPLANT
STAPLER VISISTAT 35W (STAPLE) ×2 IMPLANT
SUT ETHILON 3 0 PS 1 (SUTURE) IMPLANT
SUT VIC AB 2-0 CTX 36 (SUTURE) IMPLANT
SUT VIC AB 3-0 SH 27 (SUTURE)
SUT VIC AB 3-0 SH 27X BRD (SUTURE) IMPLANT
SWAB COLLECTION DEVICE MRSA (MISCELLANEOUS) ×4 IMPLANT
SWAB CULTURE ESWAB REG 1ML (MISCELLANEOUS) ×4 IMPLANT
SYR 30ML LL (SYRINGE) ×2 IMPLANT
TAPE CLOTH SURG 6X10 WHT LF (GAUZE/BANDAGES/DRESSINGS) ×4 IMPLANT
TOWEL GREEN STERILE (TOWEL DISPOSABLE) ×3 IMPLANT
WATER STERILE IRR 1000ML POUR (IV SOLUTION) ×3 IMPLANT

## 2017-10-14 NOTE — Progress Notes (Signed)
Patient ID: Aaron [name], male   DOB: 01-09-1956, 62 y.o.   MRN: [id number] [name] Progress Note   Assessment/ Plan:   1.  Right groin lymphocele versus abscess with associated cellulitis: Status post intravenous antibiotics with some clinical improvement but scheduled to undergo incision and drainage in OR later today. 2. ESRD: Continue hemodialysis on a Tuesday/Thursday/Saturday schedule, he does not have any acute indications for dialysis today.  Using [facility] at this time. 3. Anemia: Low hemoglobin/hematocrit noted likely secondary to inflammatory complex/ESA resistance arising from infection.  Continue ESA.  No overt loss/no indication for PRBC. 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 Nutrition: Continue nutritional supplementation with ongoing renal diet 6. Hypertension: Blood pressure marginally elevated, monitor with ultrafiltration at hemodialysis.  Subjective:   Reports continued right thigh/groin pain and no significant improvement in swelling-scheduled for incision and drainage today.   Objective:   BP (!) 142/91   Pulse 83   Temp 98.2 F (36.8 C) (Oral)   Resp (!) 27   Ht 6\' 4"  (1.93 m) Comment: copied from office visit 10/12/17  Wt 90 kg (198 lb 6.6 oz)   SpO2 100%   BMI 24.15 kg/m   Physical Exam: Gen: Comfortably resting in bed, watching television CVS: Pulse regular rhythm, normal S1 and S2, no murmur/gallop Resp: Clear to auscultation, no rales/rhonchi Abd: Soft, flat, nontender Ext: Left first stage basilic vein transposition fistula with good thrill, no lower extremity edema.  Erythema noted over right thigh with mild swelling and tenderness.  Labs: BMET Recent Labs  Lab 10/10/17 0551 10/11/17 0642 10/12/17 1906 10/13/17 1422  NA 131* 137 131* 133*  K 2.7* 3.8 4.3 4.1  CL 92* 99 92* 97*  CO2 29 27 27 25   GLUCOSE 123* 96 110* 102*  BUN 18 25*  31* 34*  CREATININE 3.88* 4.47* 5.09* 5.20*  CALCIUM 7.7* 8.1* 7.9* 7.9*  PHOS  --   --   --  3.5   CBC Recent Labs  Lab 10/10/17 0551 10/11/17 0642 10/12/17 1906 10/13/17 1422 10/14/17 0808  WBC 8.1 8.1 12.1* 12.4* 13.3*  NEUTROABS 6.8  --   --   --   --   HGB 9.5* 9.9* 9.6* 9.4* 10.1*  HCT 28.4* 30.7* 30.0* 29.3* 31.0*  MCV 98.6 100.0 99.3 100.3* 98.7  PLT 230 260 275 285 321   Medications:    . calcitRIOL  0.25 mcg Oral Q T,Th,Sa-HD  . calcium acetate  667 mg Oral TID WC  . Chlorhexidine Gluconate Cloth  6 each Topical Q0600  . darbepoetin (ARANESP) injection - DIALYSIS  60 mcg Intravenous Q Tue-HD  . multivitamin  1 tablet Oral QHS  . pantoprazole  40 mg Oral Daily  . potassium chloride  20-40 mEq Oral Once  . sodium chloride flush  3 mL Intravenous Q12H   Elmarie Shiley, MD 10/14/2017, 9:01 AM

## 2017-10-14 NOTE — Op Note (Signed)
    Patient name: JUMAANE WEATHERFORD MRN: [id number] DOB: 1955-08-30 Sex: male  10/14/2017 Pre-operative Diagnosis: right groin and abdomen fluid collection Post-operative diagnosis:  Same Surgeon:  [name] Assistants:  Laurence Slate Procedure:   #1:  I&D right groin (Subcuntaeous tissue, fascia and muscle)   #2:  I&D abdomen (Subcuntaeous tissue, fascia and muscle)   #3:  Placement of ABX beads Anesthesia:  General Blood Loss:  100 Specimens:  Cultures of both areas were sent to micrp  Findings:  Right femoral patch was incorporated.  Fluid collection was evacuated from the right groin.  There did appear to be some purulent appearing tissue from the lower abdomen.  The 2 areas were potentially connected through a subcutaneous tunnel between the 2.  Indications: The patient has previously undergone right femoral endarterectomy.  This was complicated by a seroma.  He underwent I&D of this with placement of a wound VAC.  The redo right femoral incision has healed.  He was admitted to the hospital several days ago with erythema to the right lower quadrant and inguinal area.  CT scan showed fluid collection in each area.  He was admitted for IV antibiotics.  A repeat CT scan showed residual fluid collection and because of ongoing fevers and erythema I elected to taken to the operating room for exploration.  Procedure:  The patient was identified in the holding area and taken to [place] 12  The patient was then placed supine on the table. general anesthesia was administered.  The patient was prepped and draped in the usual sterile fashion.  A time out was called and antibiotics were administered.  The patient's previous longitudinal right femoral incision was opened with a 10 blade.  Subcutaneous tissue was divided with cautery.  I used blunt dissection and entered a seroma cavity.  The fluid was somewhat discolored but did not appear obviously infected.  Culture swabs were obtained.  There was a  palpable pulse within the femoral artery.  There was tissue coverage over the bovine pericardial patch.  I then used my index finger to try and connect this incision to the fluid collection in the lower abdomen.  I felt that I potentially created a space between the 2 incisions however there could have been a small communicating tract.  Once I entered the fluid collection from the abdomen, there appeared to be purulent drainage from this area.  Because of this I elected to make a transverse right lower quadrant incision to better access the fluid collection in the abdomen.  This area did appear to be infected and a additional culture swab was sent.  All of the purulent drainage was removed.  The tissue all appeared viable.  I then used 3 L of saline to irrigate both wounds with a Pulsavac.  Antibiotic beads were then prepared with vancomycin and gentamicin and placed in both incisions.  Both incisions were then packed with wet Kerlix and sterile dressings were applied.  There were no immediate complications.   Disposition: To PACU stable   V. [name], M.D. [facility] Office: [phone] Pager:  [phone]

## 2017-10-14 NOTE — Anesthesia Preprocedure Evaluation (Signed)
Anesthesia Evaluation  Patient identified by MRN, date of birth, ID band Patient awake    Reviewed: Allergy & Precautions, NPO status , Patient's Chart, lab work & pertinent test results  Airway Mallampati: II  TM Distance: >3 FB Neck ROM: Full    Dental  (+) Edentulous Upper, Edentulous Lower   Pulmonary COPD, former smoker,    Pulmonary exam normal        Cardiovascular hypertension, Pt. on home beta blockers + CAD and + Peripheral Vascular Disease  Normal cardiovascular exam+ dysrhythmias Atrial Fibrillation  Rhythm:Regular Rate:Normal  Echo 08/25/17 Left ventricle: The cavity size was normal. There was moderate  concentric hypertrophy. Systolic function was normal. The  estimated ejection fraction was in the range of 55% to 60%. Wall  motion was normal; there were no regional wall motion  abnormalities. Left ventricular diastolic function parameters  were normal.   Neuro/Psych Anxiety Depression negative neurological ROS     GI/Hepatic GERD  Medicated and Controlled,(+) Hepatitis -, C  Endo/Other  negative endocrine ROS  Renal/GU CRFRenal disease     Musculoskeletal  (+) Arthritis , Osteoarthritis,    Abdominal   Peds  Hematology  (+) anemia ,   Anesthesia Other Findings   Reproductive/Obstetrics                             Lab Results  Component Value Date   CREATININE 3.54 (H) 10/14/2017   BUN 19 10/14/2017   NA 138 10/14/2017   K 4.1 10/14/2017   CL 100 10/14/2017   CO2 28 10/14/2017    Lab Results  Component Value Date   WBC 13.3 (H) 10/14/2017   HGB 10.1 (L) 10/14/2017   HCT 31.0 (L) 10/14/2017   MCV 98.7 10/14/2017   PLT 321 10/14/2017    Anesthesia Physical  Anesthesia Plan  ASA: III  Anesthesia Plan: General   Post-op Pain Management:    Induction: Intravenous  PONV Risk Score and Plan: 2 and Treatment may vary due to age or medical condition, Ondansetron  and Dexamethasone  Airway Management Planned: LMA  Additional Equipment:   Intra-op Plan:   Post-operative Plan: Extubation in OR  Informed Consent: I have reviewed the patients History and Physical, chart, labs and discussed the procedure including the risks, benefits and alternatives for the proposed anesthesia with the patient or authorized representative who has indicated his/her understanding and acceptance.   Dental advisory given  Plan Discussed with: CRNA, Anesthesiologist and Surgeon  Anesthesia Plan Comments:         Anesthesia Quick Evaluation

## 2017-10-14 NOTE — Anesthesia Postprocedure Evaluation (Signed)
Anesthesia Post Note  Patient: Aaron [name]  Procedure(s) Performed: RIGHT GROIN AND RIGHT LOWER QUADRANT ABDOMEN DEBRIDEMENT WITH PLACEMENT OF ANTIBIOTIC BEADS (Right )     Patient location during evaluation: PACU Anesthesia Type: General Level of consciousness: awake and alert Pain management: pain level controlled Vital Signs Assessment: post-procedure vital signs reviewed and stable Respiratory status: spontaneous breathing, nonlabored ventilation, respiratory function stable and patient connected to nasal cannula oxygen Cardiovascular status: blood pressure returned to baseline and stable Postop Assessment: no apparent nausea or vomiting Anesthetic complications: no    Last Vitals:  Vitals:   10/14/17 1255 10/14/17 1345  BP:  135/67  Pulse: 74 (!) 51  Resp:    Temp:  36.4 C  SpO2: 95% 100%    Last Pain:  Vitals:   10/14/17 1345  TempSrc: Oral  PainSc: Asleep                 Tiajuana Amass

## 2017-10-14 NOTE — Progress Notes (Addendum)
Called Dr. Trula Slade about drainage at site, verbal orders to have dressing reinforced or remove dressing on top and replace, do not remove packing.  Notified Delsa Sale, RN on 4E of order.  Rowe Pavy, RN

## 2017-10-14 NOTE — Progress Notes (Addendum)
Progress Note    10/14/2017 7:40 AM * No surgery found *  Subjective:  Says he still having pain esp when he coughs  Tm 100.2 now afebrile HR 70's-100's NSR 250'N-397'Q systolic 734% RA  Vitals:   10/13/17 2031 10/14/17 0520  BP: (!) 152/82 (!) 171/85  Pulse: (!) 25 83  Resp: (!) 25 18  Temp: 100.2 F (37.9 C) 98.6 F (37 C)  SpO2: 100% 100%    Physical Exam: General:  Diaphoretic but no distress Lungs:  Non labored Extremities:  Erythema improving; RLQ and right thigh continues to firm   CBC    Component Value Date/Time   WBC 12.4 (H) 10/13/2017 1422   RBC 2.92 (L) 10/13/2017 1422   HGB 9.4 (L) 10/13/2017 1422   HCT 29.3 (L) 10/13/2017 1422   PLT 285 10/13/2017 1422   MCV 100.3 (H) 10/13/2017 1422   MCH 32.2 10/13/2017 1422   MCHC 32.1 10/13/2017 1422   RDW 14.9 10/13/2017 1422   LYMPHSABS 0.4 (L) 10/10/2017 0551   MONOABS 0.9 10/10/2017 0551   EOSABS 0.0 10/10/2017 0551   BASOSABS 0.0 10/10/2017 0551    BMET    Component Value Date/Time   NA 133 (L) 10/13/2017 1422   K 4.1 10/13/2017 1422   CL 97 (L) 10/13/2017 1422   CO2 25 10/13/2017 1422   GLUCOSE 102 (H) 10/13/2017 1422   BUN 34 (H) 10/13/2017 1422   CREATININE 5.20 (H) 10/13/2017 1422   CALCIUM 7.9 (L) 10/13/2017 1422   GFRNONAA 11 (L) 10/13/2017 1422   GFRAA 13 (L) 10/13/2017 1422    INR    Component Value Date/Time   INR 2.17 10/12/2017 1906     Intake/Output Summary (Last 24 hours) at 10/14/2017 0740 Last data filed at 10/14/2017 1937 Gross per 24 hour  Intake 590.97 ml  Output 2005 ml  Net -1414.03 ml    CT abdomen/pelvis 10/13/17: IMPRESSION: 1. Slightly decreased size of RIGHT anterior/pelvic wall collection and RIGHT UPPER thigh collection. 2. No other significant changes. 3. Polycystic kidneys, trace bilateral pleural effusions and minimal bibasilar atelectasis again noted. 4. Aortic Atherosclerosis (ICD10-I70.0).   Assessment:  62 y.o. male is s/p:  right iliac  stent, right femoral endarterectomy, and right SFA stent by Dr. Trula Slade on 08/14/17. He developed swelling in his right groin that measured 2 x 6 cm by CT scan. This was thought to be a hematoma and was being managed conservatively. It slowly increased in size. He was taken to the operating room on 09/21/17 and underwent I&D of the right groin lymphocele and placement of vac dressing by Dr. Oneida Alar. The lymphocele cavity was penetrating all the way down to the level of the bovine pericardial patch. Approximately 50cc of fluid was evacuated. He presented back to the office on 10/02/17 and saw Dr. Donzetta Matters for increasing pain and swelling. The wound had healed over the cavity and this was open and there was significant serous fluid drained that relieved his pain. He is readmitted for IV abx    Plan: -pt still with significant pain that he states is not improved from when I saw him in the office.  Spoke with Dr. Trula Slade and he plans to take him to the operating room today.  Pt is npo -DVT prophylaxis:  SCD's only for now as he is at high risk for bleeding. -pt's albumin is low-will get nutrition consult   Leontine Locket, PA-C [facility] [phone] 10/14/2017 7:40 AM   I agree with  the above.  Continues to have fevers.  Erythema has decreased but persists.  WIll plan for I&D in the OR today  Wells Brabham

## 2017-10-14 NOTE — Anesthesia Procedure Notes (Addendum)
Procedure Name: Intubation Date/Time: 10/14/2017 11:18 AM Performed by: Scheryl Darter, CRNA Pre-anesthesia Checklist: Patient identified, Emergency Drugs available, Suction available and Patient being monitored Patient Re-evaluated:Patient Re-evaluated prior to induction Oxygen Delivery Method: Circle System Utilized Preoxygenation: Pre-oxygenation with 100% oxygen Induction Type: IV induction Ventilation: Mask ventilation without difficulty Laryngoscope Size: Miller and 2 Grade View: Grade I Tube type: Oral Tube size: 7.5 mm Number of attempts: 1 Airway Equipment and Method: Stylet and Oral airway Placement Confirmation: ETT inserted through vocal cords under direct vision,  positive ETCO2 and breath sounds checked- equal and bilateral Secured at: 23 cm Tube secured with: Tape Dental Injury: Teeth and Oropharynx as per pre-operative assessment

## 2017-10-14 NOTE — Progress Notes (Signed)
Initial Nutrition Assessment  DOCUMENTATION CODES:   Severe malnutrition in context of chronic illness  INTERVENTION:   Nepro Shake po BID, each supplement provides 425 kcal and 19 grams protein  Encourage high protein foods  Offer snacks BID  Continue Rena-vit   Recommend social work/case manager consult to determine if pt meets criteria for any additional services if home d/c is expected  NUTRITION DIAGNOSIS:   Severe Malnutrition related to chronic illness(ESRD on HD, wound) as evidenced by severe muscle depletion, severe fat depletion, energy intake < 75% for > or equal to 1 month.  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    Pt started on IV abx, having pain. Plan for I&D in OR today  Meal Completion: Pt ate 50% of Breakfast yesterday but no other meals recorded. Per pt appetite a little better here.   Pt is s/p I&D in the OR this morning, pt remains sleepy but is able to answer questions.  Per pt his usual weight is 248 lb which was 4 months ago before he got sick and has lost around 48 lb and is around 200 lb now. Per chart review pt's weight at it's highest was 225 lb in 5/19. Most weights after May have been around 200 lb. He states that he started HD about 5-6 weeks ago. He states this has gone well and has worked with the RD at HD and is able to describe foods he should avoid and reports taking his phos binders when he eats.  He states since being sick he does not have an appetite or does not feel like cooking and therefore has not been eating well since then.  He reports  Breakfast: boiled egg and toast, Lunch either a boiled egg with toast again or tuna salad and lite saltine crackers, Dinner might just be a chicken breast that he makes on the [employer]. He has been drinking 1 can of Nepro that he gets from HD every day since starting HD. In the last week he has bought high protein bars and is eating 1-2 per day.  Pt lives alone and states he is able to get around ok.   Medications reviewed and include: phoslo, rena-vit, K-Dur Labs reviewed: K+ and PO4 are WNL No urine output noted   NUTRITION - FOCUSED PHYSICAL EXAM:    Most Recent Value  Orbital Region  Severe depletion  Upper Arm Region  Severe depletion  Thoracic and Lumbar Region  Mild depletion [abdomen swollen]  Buccal Region  Moderate depletion  Temple Region  Mild depletion  Clavicle Bone Region  Mild depletion  Clavicle and Acromion Bone Region  Mild depletion  Scapular Bone Region  Unable to assess  Dorsal Hand  Severe depletion  Patellar Region  Severe depletion  Anterior Thigh Region  Severe depletion  Posterior Calf Region  Severe depletion  Edema (RD Assessment)  Moderate [R leg swollen after surgery but not L, RN notes perineal swelling]  Hair  Reviewed  Eyes  Reviewed  Mouth  Reviewed  Skin  Reviewed  Nails  Reviewed       Diet Order:   Diet Order           Diet Heart Room service appropriate? Yes; Fluid consistency: Thin  Diet effective now          EDUCATION NEEDS:   Education needs have been addressed  Skin:  Skin Assessment: Reviewed RN Assessment  Last BM:  6/30  Height:   Ht Readings from Last 1 Encounters:  10/14/17 6\' 4"  (1.93 m)    Weight:   Wt Readings from Last 1 Encounters:  10/14/17 198 lb (89.8 kg)    Ideal Body Weight:  91.8 kg  BMI:  Body mass index is 24.1 kg/m.  Estimated Nutritional Needs:   Kcal:  3646-8032  Protein:  115-135 grams  Fluid:  1.2 L/day  Maylon Peppers RD, LDN, CNSC [phone] Pager [phone] After  Hours Pager

## 2017-10-14 NOTE — Transfer of Care (Signed)
Immediate Anesthesia Transfer of Care Note  Patient: Aaron [name]  Procedure(s) Performed: RIGHT GROIN AND RIGHT LOWER QUADRANT ABDOMEN DEBRIDEMENT WITH PLACEMENT OF ANTIBIOTIC BEADS (Right )  Patient Location: PACU  Anesthesia Type:General  Level of Consciousness: awake, alert , oriented and sedated  Airway & Oxygen Therapy: Patient Spontanous Breathing and Patient connected to nasal cannula oxygen  Post-op Assessment: Report given to RN, Post -op Vital signs reviewed and stable and Patient moving all extremities  Post vital signs: Reviewed and stable  Last Vitals:  Vitals Value Taken Time  BP 134/72 10/14/2017 12:28 PM  Temp    Pulse 76 10/14/2017 12:31 PM  Resp 21 10/14/2017 12:31 PM  SpO2 99 % 10/14/2017 12:31 PM  Vitals shown include unvalidated device data.  Last Pain:  Vitals:   10/14/17 0741  TempSrc: Oral  PainSc:       Patients Stated Pain Goal: 0 (51/76/16 0737)  Complications: No apparent anesthesia complications

## 2017-10-15 DIAGNOSIS — E43 Unspecified severe protein-calorie malnutrition: Secondary | ICD-10-CM

## 2017-10-15 DIAGNOSIS — I480 Paroxysmal atrial fibrillation: Secondary | ICD-10-CM

## 2017-10-15 LAB — CULTURE, BLOOD (ROUTINE X 2)
CULTURE: NO GROWTH
Culture: NO GROWTH
SPECIAL REQUESTS: ADEQUATE
Special Requests: ADEQUATE

## 2017-10-15 LAB — CBC
HCT: 28.2 % — ABNORMAL LOW (ref 39.0–52.0)
Hemoglobin: 8.9 g/dL — ABNORMAL LOW (ref 13.0–17.0)
MCH: 31.9 pg (ref 26.0–34.0)
MCHC: 31.6 g/dL (ref 30.0–36.0)
MCV: 101.1 fL — ABNORMAL HIGH (ref 78.0–100.0)
Platelets: 317 10*3/uL (ref 150–400)
RBC: 2.79 MIL/uL — ABNORMAL LOW (ref 4.22–5.81)
RDW: 14.9 % (ref 11.5–15.5)
WBC: 10.1 10*3/uL (ref 4.0–10.5)

## 2017-10-15 LAB — RENAL FUNCTION PANEL
Albumin: 1.8 g/dL — ABNORMAL LOW (ref 3.5–5.0)
Anion gap: 9 (ref 5–15)
BUN: 27 mg/dL — AB (ref 8–23)
CHLORIDE: 99 mmol/L (ref 98–111)
CO2: 28 mmol/L (ref 22–32)
Calcium: 8.1 mg/dL — ABNORMAL LOW (ref 8.9–10.3)
Creatinine, Ser: 4.04 mg/dL — ABNORMAL HIGH (ref 0.61–1.24)
GFR calc Af Amer: 17 mL/min — ABNORMAL LOW (ref >60)
GFR, EST NON AFRICAN AMERICAN: 15 mL/min — AB (ref >60)
GLUCOSE: 113 mg/dL — AB (ref 70–99)
Phosphorus: 3.9 mg/dL (ref 2.5–4.6)
Potassium: 4.1 mmol/L (ref 3.5–5.1)
Sodium: 136 mmol/L (ref 135–145)

## 2017-10-15 LAB — PROTIME-INR
INR: 1.56
Prothrombin Time: 18.5 seconds — ABNORMAL HIGH (ref 11.4–15.2)

## 2017-10-15 MED ORDER — CALCITRIOL 0.25 MCG PO CAPS
ORAL_CAPSULE | ORAL | Status: AC
  Administered 2017-10-15: 0.25 ug via ORAL
  Filled 2017-10-15: qty 1

## 2017-10-15 MED ORDER — MORPHINE SULFATE (PF) 4 MG/ML IV SOLN
INTRAVENOUS | Status: AC
  Filled 2017-10-15: qty 1

## 2017-10-15 MED ORDER — METOPROLOL TARTRATE 5 MG/5ML IV SOLN
5.0000 mg | Freq: Once | INTRAVENOUS | Status: AC
  Administered 2017-10-15: 5 mg via INTRAVENOUS
  Filled 2017-10-15: qty 5

## 2017-10-15 MED ORDER — CARVEDILOL 25 MG PO TABS
25.0000 mg | ORAL_TABLET | Freq: Two times a day (BID) | ORAL | Status: DC
Start: 2017-10-15 — End: 2017-10-21
  Administered 2017-10-15 – 2017-10-21 (×10): 25 mg via ORAL
  Filled 2017-10-15 (×10): qty 1

## 2017-10-15 MED ORDER — CARVEDILOL 12.5 MG PO TABS: 12.5000 mg | ORAL_TABLET | Freq: Two times a day (BID) | ORAL | Status: DC

## 2017-10-15 MED ORDER — MORPHINE SULFATE (PF) 4 MG/ML IV SOLN
INTRAVENOUS | Status: DC
Start: 2017-10-15 — End: 2017-10-15
  Filled 2017-10-15: qty 1

## 2017-10-15 MED ORDER — VANCOMYCIN HCL IN DEXTROSE 1-5 GM/200ML-% IV SOLN
INTRAVENOUS | Status: AC
  Filled 2017-10-15: qty 200

## 2017-10-15 MED ORDER — CALCITRIOL 0.25 MCG PO CAPS
ORAL_CAPSULE | ORAL | Status: AC
  Filled 2017-10-15: qty 1

## 2017-10-15 NOTE — Progress Notes (Signed)
Pt received from HD. HR 120-140 a.fib. BP 128/87.  5mg  IV metoprolol given. HR now 110-115 a.fib. Dr. Donnetta Hutching paged. Will continue to monitor.  Aaron Canterbury, RN

## 2017-10-15 NOTE — Consult Note (Signed)
Cardiology Consultation:   Patient ID: Aaron [name]; [id number]; 1955/06/14   Admit date: 10/12/2017 Date of Consult: 10/15/2017  Primary Care Provider: [facility] [place] Primary Cardiologist: Dr Percival Spanish Primary Electrophysiologist:  na   Patient Profile:   Aaron [name] is a 62 y.o. male with a hx of PAF who is being seen today for the evaluation of afib with RVR at the request of Dr Donnetta Hutching.  History of Present Illness:   Aaron [name] 62 yo male with mild nonobstructive CAD other than small diag 80%, HTN, COPD, PAD, afib, ESRD.   08/2017 had right femoral endartercetomy and right SFA stent. Later developed groin infection, s/p I&D 09/21/17. Ongoing issues with the site, admitted for repeat I&D to vascular service.   Postop patient has had issues with his PAF. He has had prior issues in the periop period with his PAF. At home he is on coreg 25mg  bid for rate control. Off coumadin due to recent issues with rectus sheath hematoma from recent vascular surgeries. Denies any palpitations, SOB, or chest pain.    K 4.1 Cr 4.04WBC 10.1 Hgb 8.9 Plt 317  Echo LVEF 55-60%, no WMAs   Past Medical History:  Diagnosis Date  . Anxiety   . Arthritis    knees , back , shoulders (10/12/2017)  . CAD (coronary artery disease)    Mild nonobstructive disease at cardiac catheterization 2002  . Chronic back pain    "back of my neck; down thru my legs" (10/12/2017)  . COPD (chronic obstructive pulmonary disease) ([place])   . Diverticulitis   . Esophageal reflux   . ESRD (end stage renal disease) on dialysis ([place])    "TTS; " (10/12/2017)  . Essential hypertension   . Hepatitis C    states he no longer has this  . History of kidney stones   . History of syncope   . Hyperlipidemia   . Myocardial infarction ([place]) 02/2017   "light one" (10/12/2017)  . PAT (paroxysmal atrial tachycardia) ([place])   . Peripheral arterial disease ([place])    Occluded left superficial femoral artery status  post stent June 2016 - Dr. Trula Slade  . Pneumonia 1961  . Polycystic kidney, unspecified type   . Syncope 09/2014    Past Surgical History:  Procedure Laterality Date  . ABDOMINAL AORTOGRAM N/A 08/11/2017   Procedure: ABDOMINAL AORTOGRAM;  Surgeon: Serafina Mitchell, MD;  Location: [facility];  Service: Cardiovascular;  Laterality: N/A;  . ANTERIOR CERVICAL DECOMP/DISCECTOMY FUSION    . APPLICATION OF WOUND VAC Right 08/14/2017   Procedure: APPLICATION OF PREVENA INCISIONAL WOUND VAC RIGHT GROIN;  Surgeon: Serafina Mitchell, MD;  Location: [facility];  Service: Vascular;  Laterality: Right;  . AV FISTULA PLACEMENT Left 09/04/2017   Procedure: ARTERIOVENOUS (AV) FISTULA CREATION LEFT UPPER ARM;  Surgeon: Serafina Mitchell, MD;  Location: [facility];  Service: Vascular;  Laterality: Left;  . BACK SURGERY    . BIOPSY  12/17/2016   Procedure: BIOPSY;  Surgeon: Daneil Dolin, MD;  [facility];  Service: Gastroenterology;;  gastric colon  . BIOPSY  04/29/2017   Procedure: BIOPSY;  Surgeon: Daneil Dolin, MD;  [facility];  Service: Endoscopy;;  duodenal biopsies  . BUNIONECTOMY Bilateral   . COLONOSCOPY  2008   Dr. Oneida Alar: rare sigmoid colon diverticulosis, internal hemorrhoids.   . COLONOSCOPY WITH PROPOFOL N/A 12/17/2016   dense left-sided diverticulosis, right colon ulcers s/p biopsy query occult NSAID use vs  transient ischemia, not consistent with IBD. CMV stains negative.   Marland Kitchen ENDARTERECTOMY FEMORAL Right 08/14/2017   Procedure: RIGHT ILLIO-FEMORAL ENDARTERECTOMY;  Surgeon: Serafina Mitchell, MD;  Location: [facility] OR;  Service: Vascular;  Laterality: Right;  . ESOPHAGEAL DILATION  12/17/2016   EGD with mild Schatzki's ring s/p dilatation, small hiatal hernia, erosive gastropathy (negative H.pylori gastritis)  . ESOPHAGOGASTRODUODENOSCOPY  2008   Dr. Oneida Alar: normal esophagus without Barrett's, antritis and duodenitis, path with H.pylori gastritis  . ESOPHAGOGASTRODUODENOSCOPY (EGD)  WITH PROPOFOL N/A 12/17/2016   Procedure: ESOPHAGOGASTRODUODENOSCOPY (EGD) WITH PROPOFOL;  Surgeon: Daneil Dolin, MD;  [facility];  Service: Gastroenterology;  Laterality: N/A;  . ESOPHAGOGASTRODUODENOSCOPY (EGD) WITH PROPOFOL N/A 04/29/2017   Patchy erythema of gastric mucosa diffusely, extensive inflammatory changes in duodenum, geographic ulceration and mucosal edema present, encroaching somewhat on the lumen yet still widely patent, distal second portion of duodenum appeared abnormal, path with peptic duodenitis with ulceration  . HERNIA REPAIR  6644   umbilical  . I&D EXTREMITY Right 09/21/2017   Procedure: IRRIGATION AND DEBRIDEMENT GROIN;  Surgeon: Elam Dutch, MD;  Location: [place];  Service: Vascular;  Laterality: Right;  . INGUINAL HERNIA REPAIR Right 02/2017   [place]  . INSERTION OF DIALYSIS CATHETER Right 09/04/2017   Procedure: INSERTION OF TUNNELED  DIALYSIS CATHETER - RIGHT INTERNAL JUGULAR PLACEMENT;  Surgeon: Serafina Mitchell, MD;  Location: [facility];  Service: Vascular;  Laterality: Right;  . INSERTION OF ILIAC STENT Right 08/14/2017   Procedure: INSERTION OF RIGHT COMMON ILIAC STENT 21mm x 31mm x 130cm INSERTION OF RIGHT EXTERNAL ILIAC STENT 60mm x 4mm x 130cm INSERTION OF SUPERFICIAL FERMORAL ARTERY STENT 66mm x 45mm x 130cm;  Surgeon: Serafina Mitchell, MD;  Location[facility];  Service: Vascular;  Laterality: Right;  . IR FLUORO GUIDE CV LINE RIGHT  08/31/2017  . IR US GUIDE VASC ACCESS RIGHT  08/31/2017  . LOWER EXTREMITY ANGIOGRAPHY Right 08/11/2017   Procedure: LOWER EXTREMITY ANGIOGRAPHY;  Surgeon: Serafina Mitchell, MD;  Location: [place] [facility];  Service: Cardiovascular;  Laterality: Right;  . PATCH ANGIOPLASTY Right 08/14/2017   Procedure: PATCH ANGIOPLASTY USING HEMASHIELD PATCH 0.3IN Lillie Columbia;  Surgeon: Serafina Mitchell, MD;  [facility];  Service: Vascular;  Laterality: Right;  . PERIPHERAL VASCULAR CATHETERIZATION N/A 09/20/2014   Procedure: Abdominal  Aortogram;  Surgeon: Serafina Mitchell, MD;  Location: [place] [facility];  Service: Cardiovascular;  Laterality: N/A;  . POSTERIOR FUSION LUMBAR SPINE         Inpatient Medications: Scheduled Meds: . calcitRIOL  0.25 mcg Oral Q T,Th,Sa-HD  . calcium acetate  667 mg Oral TID WC  . carvedilol  12.5 mg Oral BID WC  . Chlorhexidine Gluconate Cloth  6 each Topical Q0600  . darbepoetin (ARANESP) injection - DIALYSIS  60 mcg Intravenous Q Tue-HD  . feeding supplement (NEPRO CARB STEADY)  237 mL Oral BID BM  . morphine      . multivitamin  1 tablet Oral QHS  . pantoprazole  40 mg Oral Daily  . potassium chloride  20-40 mEq Oral Once  . sodium chloride flush  3 mL Intravenous Q12H   Continuous Infusions: . sodium chloride Stopped (10/14/17 0821)  . sodium chloride 10 mL/hr at 10/14/17 1035  . piperacillin-tazobactam (ZOSYN)  IV 3.375 g (10/15/17 1218)  . vancomycin Stopped (10/15/17 1156)   PRN Meds: sodium chloride, acetaminophen **OR** acetaminophen, bisacodyl, guaiFENesin-dextromethorphan, hydrALAZINE, labetalol, morphine injection, ondansetron, oxyCODONE-acetaminophen, phenol,  polyethylene glycol, sodium chloride flush  Allergies:   No Known Allergies  Social History:   Social History   Socioeconomic History  . Marital status: Legally Separated    Spouse name: Not on file  . Number of children: 2  . Years of education: Not on file  . Highest education level: Not on file  Occupational History  . Occupation: DISABLED  Social Needs  . Financial resource strain: Not on file  . Food insecurity:    Worry: Not on file    Inability: Not on file  . Transportation needs:    Medical: Not on file    Non-medical: Not on file  Tobacco Use  . Smoking status: Former Smoker    Packs/day: 2.00    Years: 47.00    Pack years: 94.00    Types: Cigarettes    Last attempt to quit: 08/03/2017    Years since quitting: 0.2  . Smokeless tobacco: Never Used  Substance and Sexual Activity  .  Alcohol use: Not Currently    Comment: 10/12/2017 alcohol free since 2017,  heavy drinker in the past  . Drug use: Not Currently    Comment: "<2003 whatever was around; nothing since"  . Sexual activity: Not Currently  Lifestyle  . Physical activity:    Days per week: Not on file    Minutes per session: Not on file  . Stress: Not on file  Relationships  . Social connections:    Talks on phone: Not on file    Gets together: Not on file    Attends religious service: Not on file    Active member of club or organization: Not on file    Attends meetings of clubs or organizations: Not on file    Relationship status: Not on file  . Intimate partner violence:    Fear of current or ex partner: Not on file    Emotionally abused: Not on file    Physically abused: Not on file    Forced sexual activity: Not on file  Other Topics Concern  . Not on file  Social History Narrative   Disabled from Back since 2007    Family History:    Family History  Problem Relation Age of Onset  . Alcoholism Mother   . Heart disease Father        Massive heart attack  . Heart attack Father   . Atrial fibrillation Father   . Colon cancer Father   . Colon cancer Maternal Grandfather 74  . Alcoholism Maternal Grandfather   . Renal cancer Cousin   . Ovarian cancer Sister      ROS:  Please see the history of present illness.  All other ROS reviewed and negative.     Physical Exam/Data:   Vitals:   10/15/17 1030 10/15/17 1100 10/15/17 1109 10/15/17 1157  BP: 126/65 131/88 139/74 128/87  Pulse: 83 (!) 101 (!) 101 (!) 109  Resp:   18 17  Temp:   97.9 F (36.6 C) 98.3 F (36.8 C)  TempSrc:   Oral Oral  SpO2:   97% 97%  Weight:   199 lb 15.3 oz (90.7 kg)   Height:        Intake/Output Summary (Last 24 hours) at 10/15/2017 1326 Last data filed at 10/14/2017 1900 Gross per 24 hour  Intake 120 ml  Output -  Net 120 ml   Filed Weights   10/14/17 1028 10/15/17 0653 10/15/17 1109  Weight: 198 lb  (89.8 kg)  204 lb 5.9 oz (92.7 kg) 199 lb 15.3 oz (90.7 kg)   Body mass index is 24.34 kg/m.  General:  Well nourished, well developed, in no acute distress HEENT: normal Lymph: no adenopathy Neck: no JVD Endocrine:  No thryomegaly Cardiac:  Irreg, tachy 130 Lungs:  clear to auscultation bilaterally, no wheezing, rhonchi or rales  Abd: soft, nontender, no hepatomegaly  Ext: no edema Musculoskeletal:  No deformities, BUE and BLE strength normal and equal Skin: warm and dry  Neuro:  CNs 2-12 intact, no focal abnormalities noted Psych:  Normal affect     Laboratory Data:  Chemistry Recent Labs  Lab 10/13/17 1422 10/14/17 0808 10/15/17 0716  NA 133* 138 136  K 4.1 4.1 4.1  CL 97* 100 99  CO2 25 28 28   GLUCOSE 102* 112* 113*  BUN 34* 19 27*  CREATININE 5.20* 3.54* 4.04*  CALCIUM 7.9* 8.0* 8.1*  GFRNONAA 11* 17* 15*  GFRAA 13* 20* 17*  ANIONGAP 11 10 9     Recent Labs  Lab 10/10/17 0551 10/13/17 1422 10/15/17 0716  PROT 6.2*  --   --   ALBUMIN 2.5* 2.0* 1.8*  AST 22  --   --   ALT 11  --   --   ALKPHOS 93  --   --   BILITOT 0.5  --   --    Hematology Recent Labs  Lab 10/13/17 1422 10/14/17 0808 10/15/17 0716  WBC 12.4* 13.3* 10.1  RBC 2.92* 3.14* 2.79*  HGB 9.4* 10.1* 8.9*  HCT 29.3* 31.0* 28.2*  MCV 100.3* 98.7 101.1*  MCH 32.2 32.2 31.9  MCHC 32.1 32.6 31.6  RDW 14.9 14.7 14.9  PLT 285 321 317   Cardiac EnzymesNo results for input(s): TROPONINI in the last 168 hours. No results for input(s): TROPIPOC in the last 168 hours.  BNPNo results for input(s): BNP, PROBNP in the last 168 hours.  DDimer No results for input(s): DDIMER in the last 168 hours.  Radiology/Studies:  Ct Abdomen Pelvis Wo Contrast  Result Date: 10/13/2017 CLINICAL DATA:  62 year old male for follow-up of abdominal wall hematoma and RIGHT inguinal/thigh collections. EXAM: CT ABDOMEN AND PELVIS WITHOUT CONTRAST TECHNIQUE: Multidetector CT imaging of the abdomen and pelvis was  performed following the standard protocol without IV contrast. COMPARISON:  10/10/2017 and prior CTs FINDINGS: Lower chest: Trace bilateral pleural effusions and minimal bibasilar atelectasis again noted. Hepatobiliary: The liver and gallbladder are unremarkable except for hepatic cysts. No biliary dilatation. Pancreas: Unremarkable Spleen: Unremarkable Adrenals/Urinary Tract: Enlarged polycystic kidneys again noted. The adrenal glands and bladder are unremarkable. Stomach/Bowel: No evidence of bowel obstruction, bowel wall thickening or inflammatory changes. Vascular/Lymphatic: Aortic atherosclerosis. RIGHT iliac and femoral stents noted. No enlarged abdominal or pelvic lymph nodes. Reproductive: Prostate is unremarkable. Other: A 3 x 11 cm collection along the RIGHT LOWER abdominal/UPPER pelvic wall (series 3:61, previously measuring 3.6 x 12.4 cm on 10/10/2017. A 3.5 x 4.4 cm RIGHT UPPER thigh collection (3:94) previously 5.2 x 5.5 cm. RIGHT inguinal collection/opacity again noted. There is no evidence of ascites or pneumoperitoneum. Musculoskeletal: No acute or suspicious bony abnormalities. Lumbar spine degenerative changes and surgical changes at L2-3 and L3-4 again noted. IMPRESSION: 1. Slightly decreased size of RIGHT anterior/pelvic wall collection and RIGHT UPPER thigh collection. 2. No other significant changes. 3. Polycystic kidneys, trace bilateral pleural effusions and minimal bibasilar atelectasis again noted. 4. Aortic Atherosclerosis (ICD10-I70.0). Electronically Signed   By: Margarette Canada M.D.   On: 10/13/2017 22:42  Cath 2002 Cath 2002 ANGIOGRAPHIC DATA: 1. Left ventriculography was performed in the RAO projection. Overall global systolic function was normal. No significant mitral regurgitation was noted. The aortic leaflets appeared to move normally. Ejection fraction was calculated at 59.9%. 2. The left main coronary artery demonstrates minor calcification of  its distal-most aspect but without significant focal narrowing. This was otherwise free of significant disease. 3. The LAD coursed to the apex. It was a fairly large caliber vessel providing multiple septal perforators. Importantly, there was a minuscule diagonal Octavia Mottola that appeared to have about 80% proximal narrowing. This vessel was not suitable for percutaneous intervention by its very small size, being 1 mm or less. 4. There was an extremely large ramus intermedius that bifurcated twice. There were multiple sub branches related to this vessel, and the intermedius Climmie Buelow was free of critical disease. 5. There was an AV circumflex that was small and was free of disease. 6. The right coronary artery was a large vessel providing a large acute marginal Fardeen Steinberger, a moderate-sized posterior descending, and moderate-sized posterolateral Zubayr Bednarczyk. The right coronary artery was free of critical disease.  CONCLUSIONS: 1. Normal left ventricular function. 2. Minuscule diagonal Deedra Pro of insignificant importance with ostial disease. 3. No other critical coronary lesions noted.  DISPOSITION: We will obtain a spiral CT to complete the workup. Follow-up will be with Dr. Ron Parker. Discontinuation of smoking would be recommended. DD: 10/23/00 TD: 10/23/00 Job: [id number]    Nuclear stress test 01/23/17  Findings consistent with prior myocardial infarction.  This is an intermediate risk study.  The left ventricular ejection fraction is moderately decreased (30-44%).  There was no ST segment deviation noted during stress.  Moderate sized inferior wall infarct from apex to base no ischemia EF estimated at 36% with diffuse hypokinesis worse in inferior wall  48-hour monitor showed normal sinus rhythm with rare PVC no atrial fibrillation 01/22/17    Assessment and Plan:   1. PAF - issues with afib with RVR in increased catecholamine  state s/p surgery and ongoing infection. Beta blocker has been held as well this admit.  - off anticoag at this time due to recent issues with rectus sheath hematoma - rates 130s to 140s, patient is asymotomatic. Dose IV lopressor 5mg , restart coreg at his home dose of 25mg  bid.  - restart anticoag when ok with surgery   For questions or updates, please contact [facility] Please consult [email or web address] for contact info under Cardiology/STEMI.   Merrily Pew, MD  10/15/2017 1:26 PM

## 2017-10-15 NOTE — Progress Notes (Addendum)
Patient ID: Aaron [name], male   DOB: Oct 08, 1955, 62 y.o.   MRN: [id number] [facility] Progress Note   Assessment/ Plan:   1.  Right groin/lower abdominal wall abscess: Status post incision and drainage of the subcutaneous tissue/fascia and muscle with copious irrigation and placement of antibiotic beads (gentamicin/vancomycin) performed by Dr. Trula Slade yesterday.  Swabs sent to microbiology for culture data-continue on intravenous vancomycin and Zosyn pending results.   2. ESRD: Currently on hemodialysis-continue TTS schedule with daily assessment for acute needs.  Maturing BBF with right IJ TDC being used. 3. Anemia: Low hemoglobin/hematocrit noted likely secondary to inflammatory complex/ESA resistance arising from infection.  Continue ESA, he does not have any indications for PRBC transfusion. 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 Nutrition: Continue nutritional supplementation with ongoing renal diet 6. Hypertension: Blood pressure marginally elevated, monitor with ultrafiltration at hemodialysis. 7.  Atrial fibrillation with rapid ventricular response: Continues to have variable heart rate at hemodialysis ranging from 99-130, will restart his carvedilol with hold parameters predialysis.  Subjective:   Reports some headache and postoperative discomfort as expected.  Had episode of atrial fibrillation with rapid ventricular response overnight.   Objective:   BP 109/68   Pulse (!) 109   Temp 98.2 F (36.8 C) (Oral)   Resp 14   Ht 6\' 4"  (1.93 m)   Wt 89.8 kg (198 lb)   SpO2 96%   BMI 24.10 kg/m   Physical Exam: Gen: Comfortably sleeping on hemodialysis CVS: Pulse irregularly irregular tachycardia, normal S1 and S2, no murmur/gallop Resp: Clear to auscultation, no rales/rhonchi Abd: Soft, flat, nontender Ext: Left first stage basilic vein transposition fistula with good  thrill, no lower extremity edema.  Right groin/lower abdomen dressing intact.  Labs: BMET Recent Labs  Lab 10/10/17 0551 10/11/17 0642 10/12/17 1906 10/13/17 1422 10/14/17 0808 10/15/17 0716  NA 131* 137 131* 133* 138 136  K 2.7* 3.8 4.3 4.1 4.1 4.1  CL 92* 99 92* 97* 100 99  CO2 29 27 27 25 28 28   GLUCOSE 123* 96 110* 102* 112* 113*  BUN 18 25* 31* 34* 19 27*  CREATININE 3.88* 4.47* 5.09* 5.20* 3.54* 4.04*  CALCIUM 7.7* 8.1* 7.9* 7.9* 8.0* 8.1*  PHOS  --   --   --  3.5  --  3.9   CBC Recent Labs  Lab 10/10/17 0551  10/12/17 1906 10/13/17 1422 10/14/17 0808 10/15/17 0716  WBC 8.1   < > 12.1* 12.4* 13.3* 10.1  NEUTROABS 6.8  --   --   --   --   --   HGB 9.5*   < > 9.6* 9.4* 10.1* 8.9*  HCT 28.4*   < > 30.0* 29.3* 31.0* 28.2*  MCV 98.6   < > 99.3 100.3* 98.7 101.1*  PLT 230   < > 275 285 321 317   < > = values in this interval not displayed.   Medications:    . calcitRIOL  0.25 mcg Oral Q T,Th,Sa-HD  . calcium acetate  667 mg Oral TID WC  . Chlorhexidine Gluconate Cloth  6 each Topical Q0600  . darbepoetin (ARANESP) injection - DIALYSIS  60 mcg Intravenous Q Tue-HD  . feeding supplement (NEPRO CARB STEADY)  237 mL Oral BID BM  . multivitamin  1 tablet Oral QHS  . pantoprazole  40 mg Oral Daily  . potassium chloride  20-40 mEq Oral Once  .  sodium chloride flush  3 mL Intravenous Q12H   Elmarie Shiley, MD 10/15/2017, 9:32 AM

## 2017-10-15 NOTE — Procedures (Signed)
Patient seen on Hemodialysis. QB 400, UF goal 2L Treatment adjusted as needed.  Elmarie Shiley MD [facility]. Office # [phone] Pager # [phone] 9:30 AM

## 2017-10-15 NOTE — Progress Notes (Signed)
Pt went into afib with RVR 140's confirmed with EKG, pt was given 5 mg lopressor per order pt HR dereased to 90-100's still fib pt has history of arrthmias will continue to monitor

## 2017-10-15 NOTE — Progress Notes (Signed)
Aaron [name] is a 62 y.o. male patient. No diagnosis found. Past Medical History:  Diagnosis Date  . Anxiety   . Arthritis    knees , back , shoulders (10/12/2017)  . CAD (coronary artery disease)    Mild nonobstructive disease at cardiac catheterization 2002  . Chronic back pain    "back of my neck; down thru my legs" (10/12/2017)  . COPD (chronic obstructive pulmonary disease) ([place])   . Diverticulitis   . Esophageal reflux   . ESRD (end stage renal disease) on dialysis ([place])    "TTS; [place]" (10/12/2017)  . Essential hypertension   . Hepatitis C    states he no longer has this  . History of kidney stones   . History of syncope   . Hyperlipidemia   . Myocardial infarction ([place]) 02/2017   "light one" (10/12/2017)  . PAT (paroxysmal atrial tachycardia) ([place])   . Peripheral arterial disease ([place])    Occluded left superficial femoral artery status post stent June 2016 - Dr. Trula Slade  . Pneumonia 1961  . Polycystic kidney, unspecified type   . Syncope 09/2014   Current Facility-Administered Medications  Medication Dose Route Frequency Provider Last Rate Last Dose  . 0.9 %  sodium chloride infusion  250 mL Intravenous PRN Ulyses Amor, PA-C   Stopped at 10/14/17 4401  . 0.9 %  sodium chloride infusion   Intravenous Continuous Ulyses Amor, PA-C 10 mL/hr at 10/14/17 1035    . acetaminophen (TYLENOL) tablet 325-650 mg  325-650 mg Oral Q4H PRN Laurence Slate M, PA-C       Or  . acetaminophen (TYLENOL) suppository 325-650 mg  325-650 mg Rectal Q4H PRN Laurence Slate M, PA-C      . bisacodyl (DULCOLAX) suppository 10 mg  10 mg Rectal Daily PRN Laurence Slate M, PA-C      . calcitRIOL (ROCALTROL) capsule 0.25 mcg  0.25 mcg Oral Q T,Th,Sa-HD Collins, Emma M, PA-C      . calcium acetate (PHOSLO) capsule 667 mg  667 mg Oral TID WC Ulyses Amor, PA-C   667 mg at 10/14/17 1634  . carvedilol (COREG) tablet 12.5 mg  12.5 mg Oral BID WC Elmarie Shiley, MD      . Chlorhexidine Gluconate Cloth 2 %  PADS 6 each  6 each Topical Q0600 Ulyses Amor, PA-C   6 each at 10/15/17 [phone]  . Darbepoetin Alfa (ARANESP) injection 60 mcg  60 mcg Intravenous Q Tue-HD Collins, Emma M, PA-C      . feeding supplement (NEPRO CARB STEADY) liquid 237 mL  237 mL Oral BID BM Serafina Mitchell, MD   237 mL at 10/14/17 1634  . guaiFENesin-dextromethorphan (ROBITUSSIN DM) 100-10 MG/5ML syrup 15 mL  15 mL Oral Q4H PRN Laurence Slate M, PA-C      . hydrALAZINE (APRESOLINE) injection 5 mg  5 mg Intravenous Q20 Min PRN Laurence Slate M, PA-C      . labetalol (NORMODYNE,TRANDATE) injection 10 mg  10 mg Intravenous Q10 min PRN Ulyses Amor, PA-C   10 mg at 10/14/17 5366  . metoprolol tartrate (LOPRESSOR) injection 2-5 mg  2-5 mg Intravenous Q2H PRN Laurence Slate M, PA-C   5 mg at 10/15/17 0517  . morphine 2 MG/ML injection 2 mg  2 mg Intravenous Q2H PRN Laurence Slate M, PA-C   2 mg at 10/15/17 0002  . multivitamin (RENA-VIT) tablet 1 tablet  1 tablet Oral QHS Ulyses Amor, PA-C  1 tablet at 10/14/17 2112  . ondansetron (ZOFRAN) injection 4 mg  4 mg Intravenous Q6H PRN Laurence Slate M, PA-C      . oxyCODONE-acetaminophen (PERCOCET/ROXICET) 5-325 MG per tablet 1-2 tablet  1-2 tablet Oral Q4H PRN Ulyses Amor, PA-C   2 tablet at 10/15/17 0549  . pantoprazole (PROTONIX) EC tablet 40 mg  40 mg Oral Daily Laurence Slate M, PA-C   40 mg at 10/13/17 1020  . phenol (CHLORASEPTIC) mouth spray 1 spray  1 spray Mouth/Throat PRN Ulyses Amor, PA-C      . piperacillin-tazobactam (ZOSYN) IVPB 3.375 g  3.375 g Intravenous Q12H Laurence Slate M, PA-C 12.5 mL/hr at 10/14/17 2112 3.375 g at 10/14/17 2112  . polyethylene glycol (MIRALAX / GLYCOLAX) packet 17 g  17 g Oral Daily PRN Laurence Slate M, PA-C      . potassium chloride SA (K-DUR,KLOR-CON) CR tablet 20-40 mEq  20-40 mEq Oral Once Laurence Slate M, PA-C      . sodium chloride flush (NS) 0.9 % injection 3 mL  3 mL Intravenous Q12H Laurence Slate M, PA-C   3 mL at 10/13/17 2214  .  sodium chloride flush (NS) 0.9 % injection 3 mL  3 mL Intravenous PRN Laurence Slate M, PA-C      . vancomycin (VANCOCIN) IVPB 1000 mg/200 mL premix  1,000 mg Intravenous Q T,Th,Sa-HD Collins, Emma M, PA-C       No Known Allergies Active Problems:   Wound infection   Protein-calorie malnutrition, severe  Blood pressure 107/71, pulse (!) 106, temperature 98.2 F (36.8 C), temperature source Oral, resp. rate 12, height 6\' 4"  (1.93 m), weight 198 lb (89.8 kg), SpO2 96 %.  Seen on hemodialysis.  Majority packing removed from the groin and right lower quadrant incision.  No purulence.  Cultures pending.  Continue antibiotics and will begin wet-to-dry saline dressing changes tomorrow  Curt Jews 10/15/2017

## 2017-10-15 NOTE — Care Management Note (Signed)
Case Management Note Marvetta Gibbons RN, BSN Unit 4E-Case Manager [phone]  Patient Details  Name: Aaron [name] MRN: [id number] Date of Birth: 1955/04/28  Subjective/Objective:   Pt admitted with recurrent wound infection- s/p debridement                 Action/Plan: PTA pt lived at home, on last admission was sent home with [facility] wound VAC and [place] with [facility]. - pt does not have wound VAC post debridement on this admission at this time, will most likely need [facility] services for wound care- CM to follow for transition of care needs  Expected Discharge Date:                  Expected Discharge Plan:  [place]  In-House Referral:  NA  Discharge planning Services  CM Consult  Post Acute Care Choice:  Home Health Choice offered to:  Patient  DME Arranged:    DME Agency:     HH Arranged:    [place] Agency:  [place]  Status of Service:  In process, will continue to follow  If discussed at Long Length of Stay Meetings, dates discussed:    Discharge Disposition:   Additional Comments:  Dawayne Patricia, RN 10/15/2017, 2:13 PM

## 2017-10-16 ENCOUNTER — Encounter (HOSPITAL_COMMUNITY): Payer: Self-pay | Admitting: Surgery

## 2017-10-16 DIAGNOSIS — T45515S Adverse effect of anticoagulants, sequela: Secondary | ICD-10-CM

## 2017-10-16 DIAGNOSIS — Z992 Dependence on renal dialysis: Secondary | ICD-10-CM

## 2017-10-16 DIAGNOSIS — N186 End stage renal disease: Secondary | ICD-10-CM

## 2017-10-16 MED ORDER — SODIUM CHLORIDE 0.9 % IV SOLN
2.0000 g | INTRAVENOUS | Status: DC
  Administered 2017-10-16 – 2017-10-17 (×2): 2 g via INTRAVENOUS
  Filled 2017-10-16 (×4): qty 20

## 2017-10-16 NOTE — Progress Notes (Addendum)
  Progress Note    10/16/2017 8:39 AM 2 Days Post-Op  Subjective:  Pain in lower abd and groin   Vitals:   10/16/17 0453 10/16/17 0730  BP: (!) 150/93 (!) 161/116  Pulse: (!) 107 (!) 103  Resp: (!) 21 13  Temp: 98 F (36.7 C) 98 F (36.7 C)  SpO2: 99% 98%   Physical Exam: Lungs:  Non labored Extremities:  BLE well perfused; dressing removed from lower abd and R groin; old blood drained from lower abd incision; healthy wound bed R groin, no purulence, abx beads noted Abdomen:  Tender to light touch R lower quadrant Neurologic: A&O  CBC    Component Value Date/Time   WBC 10.1 10/15/2017 0716   RBC 2.79 (L) 10/15/2017 0716   HGB 8.9 (L) 10/15/2017 0716   HCT 28.2 (L) 10/15/2017 0716   PLT 317 10/15/2017 0716   MCV 101.1 (H) 10/15/2017 0716   MCH 31.9 10/15/2017 0716   MCHC 31.6 10/15/2017 0716   RDW 14.9 10/15/2017 0716   LYMPHSABS 0.4 (L) 10/10/2017 0551   MONOABS 0.9 10/10/2017 0551   EOSABS 0.0 10/10/2017 0551   BASOSABS 0.0 10/10/2017 0551    BMET    Component Value Date/Time   NA 136 10/15/2017 0716   K 4.1 10/15/2017 0716   CL 99 10/15/2017 0716   CO2 28 10/15/2017 0716   GLUCOSE 113 (H) 10/15/2017 0716   BUN 27 (H) 10/15/2017 0716   CREATININE 4.04 (H) 10/15/2017 0716   CALCIUM 8.1 (L) 10/15/2017 0716   GFRNONAA 15 (L) 10/15/2017 0716   GFRAA 17 (L) 10/15/2017 0716    INR    Component Value Date/Time   INR 1.56 10/15/2017 1218     Intake/Output Summary (Last 24 hours) at 10/16/2017 0839 Last data filed at 10/16/2017 0453 Gross per 24 hour  Intake 365 ml  Output 200 ml  Net 165 ml     Assessment/Plan:  62 y.o. male is s/p I&D R groin and abd 2 Days Post-Op   Hematoma drained from lower abd incision; abd and groin wounds packed with wet to dry Cultures pending; continue broad spectrum Perfusing BLE well by exam   [name], PA-C [facility] [phone] 10/16/2017 8:39 AM  I have examined the patient, reviewed  and agree with above.  Cultures pending  Curt Jews, MD 10/16/2017 10:42 AM

## 2017-10-16 NOTE — Progress Notes (Signed)
Assisted Marijo File PA with right lower abdomen and right groin dressing this AM at around 0830. Patient tolerated well with moderate pain noted. ABD pad noted with shadowing at this time. Will re-dress following lunch.

## 2017-10-16 NOTE — Progress Notes (Addendum)
Pharmacy Antibiotic Note  Aaron [name] is a 62 y.o. male who is s/p right iliac stent, right femoral endarterectomy, and right SFA stent on 08/14/17. He is here with right groin wound infection.  Pharmacy has been consulted for Vancomycin and Zosyn dosing for right groin wound infection.  He received a loading dose on 7/1 and then post-HD doses on 7/2 and 7/4. He did receive vancomycin within antibiotic beads on 7/3 when he underwent R groin and abdomen I&D. Currently remains on a TTS HD schedule. WBC WNL on last check. Afebrile.  Wound cx today growing serratia marcescens.  Plan: Vancomycin 1000 mg IV post HD on TTS >>plan for vancomycin random on 7/6 in AM prior to HD  Zosyn 3.375 g IV q12h (4hour infusion)  Monitor clinical status, culture results if done and pre HD vancomycin level per protocol >>follow up for opportunities for de-escalation.  Temp (24hrs), Avg:98.1 F (36.7 C), Min:98 F (36.7 C), Max:98.2 F (36.8 C)  Recent Labs  Lab 10/10/17 0551 10/11/17 0642 10/12/17 1906 10/13/17 1422 10/14/17 0808 10/15/17 0716  WBC 8.1 8.1 12.1* 12.4* 13.3* 10.1  CREATININE 3.88* 4.47* 5.09* 5.20* 3.54* 4.04*  LATICACIDVEN 1.14  --   --   --   --   --     Estimated Creatinine Clearance: 23.6 mL/min (A) (by C-G formula based on SCr of 4.04 mg/dL (H)).    No Known Allergies  Antimicrobials this admission: Vanc 7/1>> Zosyn 7/1>>  Dose adjustments this admission: N/A  Microbiology results: 6/29 blood - NGTD 7/3 MRSA PCR - negative  7/3 Groin wound cx - no orgs seen 7/3 Abdomen wound cx - serratia marcescens (R cefazolin)  Thank you for allowing pharmacy to be a part of this patient's care.  Aaron [name], PharmD Clinical Pharmacist  Pager: [phone] Phone: [phone] Please check Amion for pharmacy contact number 10/16/2017 12:54 PM   ADDENDUM Discussed cx results with Aaron Ligas, PA with vascular surgery. Given culture results with sensitivities, okay to de-escalate  antibiotics to ceftriaxone. Orders adjusted in system.   Aaron [name], PharmD Clinical Pharmacist

## 2017-10-16 NOTE — Progress Notes (Signed)
Progress Note  Patient Name: Aaron [name] Date of Encounter: 10/16/2017  Primary Cardiologist: Minus Breeding, MD   Subjective   No chest pain, is unaware when in a fib or SR.   Converted to SR this AM  Inpatient Medications    Scheduled Meds: . calcitRIOL  0.25 mcg Oral Q T,Th,Sa-HD  . calcium acetate  667 mg Oral TID WC  . carvedilol  25 mg Oral BID WC  . Chlorhexidine Gluconate Cloth  6 each Topical Q0600  . darbepoetin (ARANESP) injection - DIALYSIS  60 mcg Intravenous Q Tue-HD  . feeding supplement (NEPRO CARB STEADY)  237 mL Oral BID BM  . multivitamin  1 tablet Oral QHS  . pantoprazole  40 mg Oral Daily  . potassium chloride  20-40 mEq Oral Once  . sodium chloride flush  3 mL Intravenous Q12H   Continuous Infusions: . sodium chloride Stopped (10/14/17 0821)  . sodium chloride 10 mL/hr at 10/14/17 1035  . piperacillin-tazobactam (ZOSYN)  IV 3.375 g (10/16/17 0836)  . vancomycin Stopped (10/15/17 1156)   PRN Meds: sodium chloride, acetaminophen **OR** acetaminophen, bisacodyl, guaiFENesin-dextromethorphan, hydrALAZINE, labetalol, morphine injection, ondansetron, oxyCODONE-acetaminophen, phenol, polyethylene glycol, sodium chloride flush   Vital Signs    Vitals:   10/16/17 0300 10/16/17 0400 10/16/17 0453 10/16/17 0730  BP:   (!) 150/93 (!) 161/116  Pulse:   (!) 107 (!) 103  Resp: 14 (!) 22 (!) 21 13  Temp:   98 F (36.7 C) 98 F (36.7 C)  TempSrc:   Oral Oral  SpO2:   99% 98%  Weight:      Height:        Intake/Output Summary (Last 24 hours) at 10/16/2017 0922 Last data filed at 10/16/2017 0453 Gross per 24 hour  Intake 365 ml  Output 200 ml  Net 165 ml   Filed Weights   10/14/17 1028 10/15/17 0653 10/15/17 1109  Weight: 198 lb (89.8 kg) 204 lb 5.9 oz (92.7 kg) 199 lb 15.3 oz (90.7 kg)    Telemetry    SR since 0838 when converted from A fib. - Personally Reviewed  ECG    No new - Personally Reviewed  Physical Exam   GEN: No acute distress.   Though SOB after taking a bath, with productive cough, he stated it was his normal Neck: No JVD Cardiac: RRR, no murmurs, rubs, or gallops.  Respiratory: bilateral breathsounds to auscultation bilaterally. Diminished throughout GI: Soft, nontender, non-distended dressing Rt lower abd clean and dry MS: No edema; No deformity. Neuro:  Nonfocal  Psych: Normal affect   Labs    Chemistry Recent Labs  Lab 10/10/17 0551  10/13/17 1422 10/14/17 0808 10/15/17 0716  NA 131*   < > 133* 138 136  K 2.7*   < > 4.1 4.1 4.1  CL 92*   < > 97* 100 99  CO2 29   < > 25 28 28   GLUCOSE 123*   < > 102* 112* 113*  BUN 18   < > 34* 19 27*  CREATININE 3.88*   < > 5.20* 3.54* 4.04*  CALCIUM 7.7*   < > 7.9* 8.0* 8.1*  PROT 6.2*  --   --   --   --   ALBUMIN 2.5*  --  2.0*  --  1.8*  AST 22  --   --   --   --   ALT 11  --   --   --   --  ALKPHOS 93  --   --   --   --   BILITOT 0.5  --   --   --   --   GFRNONAA 15*   < > 11* 17* 15*  GFRAA 18*   < > 13* 20* 17*  ANIONGAP 10   < > 11 10 9    < > = values in this interval not displayed.     Hematology Recent Labs  Lab 10/13/17 1422 10/14/17 0808 10/15/17 0716  WBC 12.4* 13.3* 10.1  RBC 2.92* 3.14* 2.79*  HGB 9.4* 10.1* 8.9*  HCT 29.3* 31.0* 28.2*  MCV 100.3* 98.7 101.1*  MCH 32.2 32.2 31.9  MCHC 32.1 32.6 31.6  RDW 14.9 14.7 14.9  PLT 285 321 317    Cardiac EnzymesNo results for input(s): TROPONINI in the last 168 hours. No results for input(s): TROPIPOC in the last 168 hours.   BNPNo results for input(s): BNP, PROBNP in the last 168 hours.   DDimer No results for input(s): DDIMER in the last 168 hours.   Radiology    No results found.  Cardiac Studies   Echo 01/14/17 from [facility]   EF 40-45%, G1DD, LA mildly dilated, mild concentric LVH   Patient Profile     62 y.o. male with a hx of PAF, mild nonobstructive CAD other than small diag, 80%, HTN, COPD, PAD and ESRD admitted for I&D of Rt groin lymphocele (10/14/17)  after Rt femoral  endarterectomy and Rt SFA 08/14/17 now with afib with RVR post op.  Assessment & Plan    PAF --BB had been held, yesterday given 5 mg IV and restarted hoome dose 25 mg BID  --today converted to SR at 0838 with normal HR. --resume anticoag when ok for surgery.    Hematoma Rt groin, lower abd post op  I&D.  Per vascular surgery.    ESRD per HD.        For questions or updates, please contact [place] Please consult [email or web address] for contact info under Cardiology/STEMI.      Signed, Cecilie Kicks, NP  10/16/2017, 9:22 AM

## 2017-10-16 NOTE — Progress Notes (Signed)
Patient ID: Aaron [name], male   DOB: 06/22/1955, 62 y.o.   MRN: [id number] [facility] Progress Note   Assessment/ Plan:   1.  Right groin/lower abdominal wall abscess: Status post incision and drainage of the subcutaneous tissue/fascia and muscle with copious irrigation and placement of antibiotic beads (gentamicin/vancomycin) by Dr. Trula Slade on 7/3.  On empiric antimicrobial therapy with vancomycin and Zosyn-swab cultures negative to date (likely to be negative with preceding antibiotic use).    2. ESRD: We will order for hemodialysis tomorrow to continue him on his usual outpatient TTS schedule, no acute indications noted at this time.  Maturing BBF with right IJ TDC being used. 3. Anemia: Lower hemoglobin/hematocrit noted for which I will continue ESA.  No indications for PRBC transfusion. 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 Nutrition: Continue nutritional supplementation with ongoing renal diet 6. Hypertension: Blood pressure marginally elevated, monitor with ultrafiltration at hemodialysis. 7.  Atrial fibrillation with rapid ventricular response: Restarted back on carvedilol yesterday, overall rate appears to be trending towards improvement.  Subjective:   Reports pain over groin/right lower abdomen incision and drainage sites-wounds just packed.   Objective:   BP (!) 161/116   Pulse (!) 103   Temp 98 F (36.7 C) (Oral)   Resp 13   Ht 6\' 4"  (1.93 m)   Wt 90.7 kg (199 lb 15.3 oz)   SpO2 98%   BMI 24.34 kg/m   Physical Exam: Gen: Comfortably resting in bed, watching television CVS: Pulse irregularly irregular tachycardia, normal S1 and S2, no murmur/gallop Resp: Clear to auscultation, no rales/rhonchi Abd: Soft, flat, nontender, clean wound packing/dressings noted right lower quadrant/groin Ext: Left first stage basilic vein transposition fistula with good thrill, no  lower extremity edema.  Right groin/lower abdomen dressing intact.  Labs: BMET Recent Labs  Lab 10/10/17 0551 10/11/17 0642 10/12/17 1906 10/13/17 1422 10/14/17 0808 10/15/17 0716  NA 131* 137 131* 133* 138 136  K 2.7* 3.8 4.3 4.1 4.1 4.1  CL 92* 99 92* 97* 100 99  CO2 29 27 27 25 28 28   GLUCOSE 123* 96 110* 102* 112* 113*  BUN 18 25* 31* 34* 19 27*  CREATININE 3.88* 4.47* 5.09* 5.20* 3.54* 4.04*  CALCIUM 7.7* 8.1* 7.9* 7.9* 8.0* 8.1*  PHOS  --   --   --  3.5  --  3.9   CBC Recent Labs  Lab 10/10/17 0551  10/12/17 1906 10/13/17 1422 10/14/17 0808 10/15/17 0716  WBC 8.1   < > 12.1* 12.4* 13.3* 10.1  NEUTROABS 6.8  --   --   --   --   --   HGB 9.5*   < > 9.6* 9.4* 10.1* 8.9*  HCT 28.4*   < > 30.0* 29.3* 31.0* 28.2*  MCV 98.6   < > 99.3 100.3* 98.7 101.1*  PLT 230   < > 275 285 321 317   < > = values in this interval not displayed.   Medications:    . calcitRIOL  0.25 mcg Oral Q T,Th,Sa-HD  . calcium acetate  667 mg Oral TID WC  . carvedilol  25 mg Oral BID WC  . Chlorhexidine Gluconate Cloth  6 each Topical Q0600  . darbepoetin (ARANESP) injection - DIALYSIS  60 mcg Intravenous Q Tue-HD  . feeding supplement (NEPRO CARB STEADY)  237 mL Oral BID BM  . multivitamin  1 tablet Oral QHS  .  pantoprazole  40 mg Oral Daily  . potassium chloride  20-40 mEq Oral Once  . sodium chloride flush  3 mL Intravenous Q12H   Elmarie Shiley, MD 10/16/2017, 9:30 AM

## 2017-10-17 MED ORDER — HEPARIN SODIUM (PORCINE) 1000 UNIT/ML DIALYSIS: 40.0000 [IU]/kg | INTRAMUSCULAR | Status: DC | PRN

## 2017-10-17 NOTE — Progress Notes (Signed)
[facility] Progress Note  Assessment/Plan: 1.  Right groin/lower abdominal wall abscess: Status post incision and drainage of the subcutaneous tissue/fascia and muscle with copious irrigation and placement of antibiotic beads (gentamicin/vancomycin) by Dr. Trula Slade on 7/3.  On empiric antimicrobial therapy with vancomycin and Zosyn-swab cultures negative to date (likely to be negative with preceding antibiotic use).   WBC wnl 2. ESRD:  TTS Maturing BBF with right IJ TDC being used. Labs pending 3. Anemia:hgb 8.9 Aranesp 60 given 7/2 - if continues to drift down might ^ next dose on Tuesday. 29% sat 6/20 No indications for PRBC transfusion. 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 Nutrition: Continue nutritional supplementation with ongoing renal diet 6. Hypertension: Blood pressure marginally elevated, monitor with ultrafiltration at hemodialysis.Coming down with HD goal 2.5  7.  Atrial fibrillation with rapid ventricular response: Restarted back on carvedilol yesterday, overall rate appears to be trending towards improvement.   Myriam Jacobson, PA-C [place] [phone] 10/17/2017,8:58 AM  LOS: 5 days   Subjective:   Tired of multiple surgeries. No problems with HD  Objective Vitals:   10/17/17 0713 10/17/17 0716 10/17/17 0800 10/17/17 0830  BP: (!) 177/96 (!) 176/102 (!) 142/84 (!) 143/71  Pulse: 62 67 60 62  Resp: 17 18 15 15   Temp: 97.7 F (36.5 C)     TempSrc: Oral     SpO2: 100%     Weight: 89.7 kg (197 lb 12 oz)     Height:       Physical Exam  General: NAD on HD Heart: RRR with occ ectopy Lungs: grossly clear Abdomen: soft NT with right groin dressing in tact Extremities: no LE edema Dialysis Access: left first stage BVT + thrill right IJ [facility]   Additional Objective Labs: Basic Metabolic Panel: Recent Labs  Lab 10/13/17 1422  10/14/17 0808 10/15/17 0716  NA 133* 138 136  K 4.1 4.1 4.1  CL 97* 100 99  CO2 25 28 28   GLUCOSE 102* 112* 113*  BUN 34* 19 27*  CREATININE 5.20* 3.54* 4.04*  CALCIUM 7.9* 8.0* 8.1*  PHOS 3.5  --  3.9   Liver Function Tests: Recent Labs  Lab 10/13/17 1422 10/15/17 0716  ALBUMIN 2.0* 1.8*   No results for input(s): LIPASE, AMYLASE in the last 168 hours. CBC: Recent Labs  Lab 10/11/17 0642 10/12/17 1906 10/13/17 1422 10/14/17 0808 10/15/17 0716  WBC 8.1 12.1* 12.4* 13.3* 10.1  HGB 9.9* 9.6* 9.4* 10.1* 8.9*  HCT 30.7* 30.0* 29.3* 31.0* 28.2*  MCV 100.0 99.3 100.3* 98.7 101.1*  PLT 260 275 285 321 317   Blood Culture    Component Value Date/Time   SDES WOUND ABDOMEN 10/14/2017 1144   SPECREQUEST SPECIMEN B 10/14/2017 1144   CULT  10/14/2017 1144    RARE SERRATIA MARCESCENS NO ANAEROBES ISOLATED; CULTURE IN PROGRESS FOR 5 DAYS    REPTSTATUS PENDING 10/14/2017 1144    Cardiac Enzymes: No results for input(s): CKTOTAL, CKMB, CKMBINDEX, TROPONINI in the last 168 hours. CBG: Recent Labs  Lab 10/14/17 0954  GLUCAP 105*   Iron Studies: No results for input(s): IRON, TIBC, TRANSFERRIN, FERRITIN in the last 72 hours. Lab Results  Component Value Date   INR 1.56 10/15/2017   INR 2.17 10/12/2017   INR 2.51 10/10/2017   Studies/Results: No results found. Medications: . sodium chloride Stopped (10/14/17 0821)  . sodium chloride 10 mL/hr at 10/14/17  1035  . cefTRIAXone (ROCEPHIN)  IV Stopped (10/16/17 1749)   . calcitRIOL  0.25 mcg Oral Q T,Th,Sa-HD  . calcium acetate  667 mg Oral TID WC  . carvedilol  25 mg Oral BID WC  . Chlorhexidine Gluconate Cloth  6 each Topical Q0600  . darbepoetin (ARANESP) injection - DIALYSIS  60 mcg Intravenous Q Tue-HD  . feeding supplement (NEPRO CARB STEADY)  237 mL Oral BID BM  . multivitamin  1 tablet Oral QHS  . pantoprazole  40 mg Oral Daily  . potassium chloride  20-40 mEq Oral Once  . sodium chloride flush  3 mL  Intravenous Q12H

## 2017-10-17 NOTE — Progress Notes (Addendum)
   VASCULAR SURGERY ASSESSMENT & PLAN:   Postop day 3 status post incision and drainage of right groin hematoma.  His dressing was just changed this morning and is dry.  For hemodialysis today.  ID: His wound culture shows rare Serratia marcescens.  This is sensitive to ceftriaxone.  He is on intravenous ceftriaxone.   SUBJECTIVE:   No complaints this morning  PHYSICAL EXAM:   Vitals:   10/17/17 0713 10/17/17 0716 10/17/17 0800 10/17/17 0830  BP: (!) 177/96 (!) 176/102 (!) 142/84 (!) 143/71  Pulse: 62 67 60 62  Resp: 17 18 15 15   Temp: 97.7 F (36.5 C)     TempSrc: Oral     SpO2: 100%     Weight: 197 lb 12 oz (89.7 kg)     Height:       His dressing was just done and is dry.  LABS:   Lab Results  Component Value Date   WBC 10.1 10/15/2017   HGB 8.9 (L) 10/15/2017   HCT 28.2 (L) 10/15/2017   MCV 101.1 (H) 10/15/2017   PLT 317 10/15/2017   Lab Results  Component Value Date   CREATININE 4.04 (H) 10/15/2017   Lab Results  Component Value Date   INR 1.56 10/15/2017   CBG (last 3)  Recent Labs    10/14/17 0954  GLUCAP 105*    PROBLEM LIST:    Active Problems:   Wound infection   Protein-calorie malnutrition, severe   CURRENT MEDS:   . calcitRIOL  0.25 mcg Oral Q T,Th,Sa-HD  . calcium acetate  667 mg Oral TID WC  . carvedilol  25 mg Oral BID WC  . Chlorhexidine Gluconate Cloth  6 each Topical Q0600  . darbepoetin (ARANESP) injection - DIALYSIS  60 mcg Intravenous Q Tue-HD  . feeding supplement (NEPRO CARB STEADY)  237 mL Oral BID BM  . multivitamin  1 tablet Oral QHS  . pantoprazole  40 mg Oral Daily  . potassium chloride  20-40 mEq Oral Once  . sodium chloride flush  3 mL Intravenous Q12H    Deitra Mayo Beeper: [phone] Office: [phone] 10/17/2017

## 2017-10-17 NOTE — Procedures (Signed)
Patient seen on Hemodialysis. QB 400, UF goal 2L Treatment adjusted as needed.  Elmarie Shiley MD [facility]. Office # [phone] Pager # [phone] 9:42 AM

## 2017-10-18 LAB — PROTIME-INR
INR: 1.2
PROTHROMBIN TIME: 15.1 s (ref 11.4–15.2)

## 2017-10-18 MED ORDER — CIPROFLOXACIN HCL 500 MG PO TABS
500.0000 mg | ORAL_TABLET | Freq: Every day | ORAL | Status: DC
  Administered 2017-10-18 – 2017-10-20 (×3): 500 mg via ORAL
  Filled 2017-10-18 (×4): qty 1

## 2017-10-18 MED ORDER — AMOXICILLIN 500 MG PO CAPS
500.0000 mg | ORAL_CAPSULE | Freq: Every day | ORAL | Status: DC
  Administered 2017-10-18 – 2017-10-20 (×3): 500 mg via ORAL
  Filled 2017-10-18 (×3): qty 1

## 2017-10-18 MED ORDER — WARFARIN SODIUM 5 MG PO TABS
5.0000 mg | ORAL_TABLET | Freq: Once | ORAL | Status: AC
  Administered 2017-10-18: 5 mg via ORAL
  Filled 2017-10-18: qty 1

## 2017-10-18 MED ORDER — AMOXICILLIN 500 MG PO CAPS
500.0000 mg | ORAL_CAPSULE | Freq: Once | ORAL | Status: AC
  Administered 2017-10-18: 500 mg via ORAL
  Filled 2017-10-18: qty 1

## 2017-10-18 MED ORDER — WARFARIN - PHARMACIST DOSING INPATIENT: Freq: Every day | Status: DC

## 2017-10-18 NOTE — Progress Notes (Addendum)
Aaron [name] for Coumadin Indication: atrial fibrillation  No Known Allergies  Patient Measurements: Height: 6\' 4"  (193 cm) Weight: 191 lb 12.8 oz (87 kg) IBW/kg (Calculated) : 86.8 Heparin Dosing Weight: n/a  Vital Signs: Temp: 97.7 F (36.5 C) (07/07 0408) Temp Source: Oral (07/07 0408) BP: 177/96 (07/07 0759) Pulse Rate: 62 (07/07 0815)  Labs: Recent Labs    10/15/17 1218  LABPROT 18.5*  INR 1.56    Estimated Creatinine Clearance: 23.6 mL/min (A) (by C-G formula based on SCr of 4.04 mg/dL (H)).   Medical History: Past Medical History:  Diagnosis Date  . Anxiety   . Arthritis    knees , back , shoulders (10/12/2017)  . CAD (coronary artery disease)    Mild nonobstructive disease at cardiac catheterization 2002  . Chronic back pain    "back of my neck; down thru my legs" (10/12/2017)  . COPD (chronic obstructive pulmonary disease) ([place])   . Diverticulitis   . Esophageal reflux   . ESRD (end stage renal disease) on dialysis ([place])    "TTS; [place]" (10/12/2017)  . Essential hypertension   . Hepatitis C    states he no longer has this  . History of kidney stones   . History of syncope   . Hyperlipidemia   . Myocardial infarction ([place]) 02/2017   "light one" (10/12/2017)  . PAT (paroxysmal atrial tachycardia) ([place])   . Peripheral arterial disease ([place])    Occluded left superficial femoral artery status post stent June 2016 - Dr. Trula Slade  . Pneumonia 1961  . Polycystic kidney, unspecified type   . Syncope 09/2014    Medications:  Scheduled:  . amoxicillin  500 mg Oral QHS  . amoxicillin  500 mg Oral Once  . calcitRIOL  0.25 mcg Oral Q T,Th,Sa-HD  . calcium acetate  667 mg Oral TID WC  . carvedilol  25 mg Oral BID WC  . Chlorhexidine Gluconate Cloth  6 each Topical Q0600  . ciprofloxacin  500 mg Oral QHS  . darbepoetin (ARANESP) injection - DIALYSIS  60 mcg Intravenous Q Tue-HD  . feeding supplement (NEPRO CARB STEADY)  237  mL Oral BID BM  . multivitamin  1 tablet Oral QHS  . pantoprazole  40 mg Oral Daily  . potassium chloride  20-40 mEq Oral Once  . sodium chloride flush  3 mL Intravenous Q12H  . warfarin  5 mg Oral ONCE-1800  . Warfarin - Pharmacist Dosing Inpatient   Does not apply q1800    Assessment: 62 yo male w/ ESRD admitted with R groin wound infection s/p R iliac stent and R femoral endarterectomy.  On chronic Coumadin for Afib which was held for I&D of wound.  Pharmacy asked to resume today.  INR down to 1.5 on 7/4.  PTA Coumadin dose = 5 mg daily except 2.5 on Mon/Thurs  Goal of Therapy:  INR 2-3 Monitor platelets by anticoagulation protocol: Yes   Plan:  1. Coumadin 5 mg x 1 tonight. 2. Daily INR.  Marguerite Olea, [facility] Clinical Pharmacist Phone [phone]  10/18/2017 9:39 AM

## 2017-10-18 NOTE — Progress Notes (Signed)
Pharmacy Antibiotic Note  Aaron [name] is a 62 y.o. male who is s/p right iliac stent, right femoral endarterectomy, and right SFA stent on 08/14/17. He is here with right groin wound infection.  Previously on vancomycin and zosyn, then narrowed to ceftriaxone.  Currently remains on a TTS HD schedule. WBC WNL on last check. Afebrile.  Wound cx now growing serratia marcescens + enterococcus faecalis which is not treated with ceftriaxone.  Plan: Spoke to Dr. Scot Dock.  Will change antibiotic regimen to po in anticipation of discharge.  Will give cipro to cover serratia and amoxicillin to cover enterococcus.  Unfortunately there isn't one oral antibiotic to cover both organisms. Cipro 500 mg QHS (give in evenings so will be after HD on HD days) Amoxicillin 500 mg QHS (give in evenings so will be after HD on HD days)   Temp (24hrs), Avg:97.9 F (36.6 C), Min:97.7 F (36.5 C), Max:98 F (36.7 C)  Recent Labs  Lab 10/12/17 1906 10/13/17 1422 10/14/17 0808 10/15/17 0716  WBC 12.1* 12.4* 13.3* 10.1  CREATININE 5.09* 5.20* 3.54* 4.04*    Estimated Creatinine Clearance: 23.6 mL/min (A) (by C-G formula based on SCr of 4.04 mg/dL (H)).    No Known Allergies  Antimicrobials this admission: Zosyn 7/1>>7/5 Vanc 7/1>>7/5 Ceftriaxone 7/5>>7/7 *2g given 7/1, 1g 7/2 after HD; got powder/beads during I&D 7/3 Amoxicillin 7/7 > Cipro 7/7 >   Dose adjustments this admission: N/A  Microbiology results: 6/29 blood - NGTD 7/3 MRSA PCR - negative  7/3 Groin wound cx - serratia + enterococcus faecalis 7/3 Abdomen wound cx - serratia marcescens (R cefazolin)  Thank you for allowing pharmacy to be a part of this patient's care.  Marguerite Olea, [facility] Clinical Pharmacist Phone [phone]  10/18/2017 8:50 AM

## 2017-10-18 NOTE — Progress Notes (Addendum)
[facility] [place]  Subjective  - Comfortable states the wound areas feel softer and more comfortable since surgery.  Objective (!) 175/87 (!) 139 97.7 F (36.5 C) (Oral) 17 100%  Intake/Output Summary (Last 24 hours) at 10/18/2017 0714 Last data filed at 10/17/2017 1141 Gross per 24 hour  Intake -  Output 2000 ml  Net -2000 ml   Palpable DP left LE Firmness to palpation lateral LQ, groin softer surrounding incision Groin beefy red base, abdominal packing less than surgical about 5 cm of guaze packing lateral abdominal wound.  Antibiotic beads in place. Dressing changed daily per RN Heart RRR  Cultures showed rare Serratia marcescens  Currently on IV Ceftriaxone q 24 hours.  Assessment/Planning: POD # 4 incision and drainage of right groin hematoma.  Right lower quadrant and inguinal area  Plan possible D/C next week with PO antibiotics He is requiring IV pain medication with dressing changes and PO percocet.  Will discuss this with patient in regards to home dressing changes.  He will go home with [facility] RN for dressing changes wet to dry packing. Continue IV antibiotics. Nephrology T-TH-Sat HD Cardiology following A fib Coumadin OK to restart will order per pharmacy dosing.  Roxy Horseman 10/18/2017 7:14 AM  I have interviewed the patient and examined the patient. I agree with the findings by the PA. The wounds in the RLQ and right groin are improving, but will likely need a few more days in the hospital for meticulous wound care. OK to restart Coumadin.   ID: In addition to Serratia, reportedly the culture has also grown enterococcus which is sensitive to amoxicillin (per pharmacy).  As the plan is to try to discharge him to home ultimately, we will switch his antibiotics to Cipro and amoxicillin which covers the Serratia and enterococcus.  Gae Gallop, MD [phone]

## 2017-10-18 NOTE — Progress Notes (Signed)
Patient ID: Aaron [name], male   DOB: 1955-07-01, 62 y.o.   MRN: [id number] [facility] Progress Note   Assessment/ Plan:   1.  Right groin/lower abdominal wall abscess: Status post incision and drainage of the subcutaneous tissue/fascia and muscle with copious irrigation and placement of antibiotic beads (gentamicin/vancomycin) by Dr. Trula Slade on 7/3.  Cultures revealing Serratia marcescens-antibiotic therapy transitioned to amoxicillin/ciprofloxacin based on sensitivity data.   2. ESRD: Continue hemodialysis on his usual Tuesday/Thursday/Saturday schedule-had uneventful hemodialysis yesterday and he does not have indications for dialysis today.  Maturing BBF with right IJ TDC being used. 3. Anemia: Decreasing hemoglobin noted on labs from 3 days ago-we will reorder labs with hemodialysis again next week.  ESA Tuesday. 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 Nutrition: Continue nutritional supplementation with ongoing renal diet 6. Hypertension: Blood pressure marginally elevated, monitor with ultrafiltration at hemodialysis. 7.  Atrial fibrillation with rapid ventricular response: Restarted back on carvedilol yesterday, overall rate appears to be trending towards improvement.  Subjective:   Reports uneventful night and pain over surgical site only during dressing changes.   Objective:   BP (!) 177/96 (BP Location: Right Arm)   Pulse 62   Temp 97.7 F (36.5 C) (Oral)   Resp (!) 24   Ht 6\' 4"  (1.93 m)   Wt 87 kg (191 lb 12.8 oz)   SpO2 100%   BMI 23.35 kg/m   Physical Exam: Gen: Comfortably resting in bed, watching television CVS: Pulse irregularly irregular, normal rate, normal S1 and S2, no murmur/gallop Resp: Clear to auscultation, no rales/rhonchi Abd: Soft, flat, nontender, clean wound packing/dressings noted right lower quadrant/groin Ext: Left first stage basilic vein  transposition fistula with good thrill, no lower extremity edema.   Labs: BMET Recent Labs  Lab 10/12/17 1906 10/13/17 1422 10/14/17 0808 10/15/17 0716  NA 131* 133* 138 136  K 4.3 4.1 4.1 4.1  CL 92* 97* 100 99  CO2 27 25 28 28   GLUCOSE 110* 102* 112* 113*  BUN 31* 34* 19 27*  CREATININE 5.09* 5.20* 3.54* 4.04*  CALCIUM 7.9* 7.9* 8.0* 8.1*  PHOS  --  3.5  --  3.9   CBC Recent Labs  Lab 10/12/17 1906 10/13/17 1422 10/14/17 0808 10/15/17 0716  WBC 12.1* 12.4* 13.3* 10.1  HGB 9.6* 9.4* 10.1* 8.9*  HCT 30.0* 29.3* 31.0* 28.2*  MCV 99.3 100.3* 98.7 101.1*  PLT 275 285 321 317   Medications:    . amoxicillin  500 mg Oral QHS  . amoxicillin  500 mg Oral Once  . calcitRIOL  0.25 mcg Oral Q T,Th,Sa-HD  . calcium acetate  667 mg Oral TID WC  . carvedilol  25 mg Oral BID WC  . Chlorhexidine Gluconate Cloth  6 each Topical Q0600  . ciprofloxacin  500 mg Oral QHS  . darbepoetin (ARANESP) injection - DIALYSIS  60 mcg Intravenous Q Tue-HD  . feeding supplement (NEPRO CARB STEADY)  237 mL Oral BID BM  . multivitamin  1 tablet Oral QHS  . pantoprazole  40 mg Oral Daily  . potassium chloride  20-40 mEq Oral Once  . sodium chloride flush  3 mL Intravenous Q12H   Elmarie Shiley, MD 10/18/2017, 9:15 AM

## 2017-10-18 NOTE — Progress Notes (Signed)
Patient ambulated in hallway independently. Lacresha Fusilier Jessup RN  

## 2017-10-19 LAB — AEROBIC/ANAEROBIC CULTURE W GRAM STAIN (SURGICAL/DEEP WOUND)

## 2017-10-19 LAB — AEROBIC/ANAEROBIC CULTURE (SURGICAL/DEEP WOUND)

## 2017-10-19 LAB — PROTIME-INR
INR: 1.1
Prothrombin Time: 14.1 seconds (ref 11.4–15.2)

## 2017-10-19 MED ORDER — WARFARIN SODIUM 5 MG PO TABS
5.0000 mg | ORAL_TABLET | Freq: Once | ORAL | Status: AC
  Administered 2017-10-19: 5 mg via ORAL
  Filled 2017-10-19: qty 1

## 2017-10-19 MED ORDER — HYDROMORPHONE HCL 1 MG/ML IJ SOLN: 2.0000 mg | Freq: Once | INTRAMUSCULAR | Status: AC

## 2017-10-19 MED ORDER — HYDROMORPHONE HCL 1 MG/ML IJ SOLN
INTRAMUSCULAR | Status: AC
  Administered 2017-10-19: 2 mg
  Filled 2017-10-19: qty 2

## 2017-10-19 MED ORDER — LABETALOL HCL 5 MG/ML IV SOLN
10.0000 mg | INTRAVENOUS | Status: DC | PRN
  Administered 2017-10-19: 10 mg via INTRAVENOUS
  Filled 2017-10-19: qty 4

## 2017-10-19 MED ORDER — CHLORHEXIDINE GLUCONATE CLOTH 2 % EX PADS
6.0000 | MEDICATED_PAD | Freq: Every day | CUTANEOUS | Status: DC
  Administered 2017-10-20 – 2017-10-21 (×2): 6 via TOPICAL

## 2017-10-19 NOTE — Progress Notes (Addendum)
[facility] [place]  Subjective  - resting well.   Objective (!) 179/101 70 97.6 F (36.4 C) (Oral) 18 100%  Intake/Output Summary (Last 24 hours) at 10/19/2017 0720 Last data filed at 10/18/2017 1600 Gross per 24 hour  Intake 960 ml  Output 900 ml  Net 60 ml    Palpable Dp left LE Skin surrounding incision without erythema, still firmness to palpation. Dressing changed q am by nursing Heart RRR Gen NAD  Assessment/Planning: POD # 4 incision and drainage of right groin hematoma.  Right lower quadrant and inguinal area  He will go home with [facility] RN for dressing changes wet to dry packing.  IV antibiotics stopped now on PO Amoxicillin and Cipro  Nephrology T-TH-Sat HD Coumadin restarted Plan for Dr. Trula Slade to exam wounds today and decide possible discharge date.        Roxy Horseman 10/19/2017 7:20 AM --  Laboratory Lab Results: No results for input(s): WBC, HGB, HCT, PLT in the last 72 hours. BMET No results for input(s): NA, K, CL, CO2, GLUCOSE, BUN, CREATININE, CALCIUM in the last 72 hours.  COAG Lab Results  Component Value Date   INR 1.10 10/19/2017   INR 1.20 10/18/2017   INR 1.56 10/15/2017   No results found for: PTT  Dressing changed today with 2 mg of dilaudid.  Wound bed looks healthy in the abdomen and groin.  Continue dressing changes.  He can go home when he can tolerate dressing changes without IV pain meds, likely 2-3 days  [name]

## 2017-10-19 NOTE — Progress Notes (Signed)
Aaron [name] for Coumadin Indication: atrial fibrillation  No Known Allergies  Patient Measurements: Height: 6\' 4"  (193 cm) Weight: 191 lb 12.8 oz (87 kg) IBW/kg (Calculated) : 86.8 Heparin Dosing Weight: n/a  Vital Signs: Temp: 97.6 F (36.4 C) (07/08 0450) Temp Source: Oral (07/08 0450) BP: 188/101 (07/08 0801) Pulse Rate: 70 (07/08 0450)  Labs: Recent Labs    10/18/17 1016 10/19/17 0328  LABPROT 15.1 14.1  INR 1.20 1.10    Estimated Creatinine Clearance: 23.6 mL/min (A) (by C-G formula based on SCr of 4.04 mg/dL (H)).     Assessment: 62 yo male w/ ESRD admitted with R groin wound infection s/p R iliac stent and R femoral endarterectomy.  On chronic Coumadin for Afib which was held for I&D of wound.  Pharmacy asked to resume today.  INR down to 1.1  PTA Coumadin dose = 5 mg daily except 2.5 on Mon/Thurs  Goal of Therapy:  INR 2-3 Monitor platelets by anticoagulation protocol: Yes   Plan:  1. Repeat Coumadin 5 mg x 1 tonight. 2. Daily INR.  Thank you Anette Guarneri, PharmD [phone]  10/19/2017 8:22 AM

## 2017-10-19 NOTE — Progress Notes (Addendum)
[facility] Progress Note  Subjective: seen on HD  Vitals:   10/18/17 1702 10/18/17 2058 10/19/17 0450 10/19/17 0801  BP:   (!) 179/101 (!) 188/101  Pulse: 74  70   Resp:   18   Temp:  98.3 F (36.8 C) 97.6 F (36.4 C)   TempSrc:  Oral Oral   SpO2:   100%   Weight:      Height:        Inpatient medications: . amoxicillin  500 mg Oral QHS  . calcitRIOL  0.25 mcg Oral Q T,Th,Sa-HD  . calcium acetate  667 mg Oral TID WC  . carvedilol  25 mg Oral BID WC  . Chlorhexidine Gluconate Cloth  6 each Topical Q0600  . ciprofloxacin  500 mg Oral QHS  . darbepoetin (ARANESP) injection - DIALYSIS  60 mcg Intravenous Q Tue-HD  . feeding supplement (NEPRO CARB STEADY)  237 mL Oral BID BM  . multivitamin  1 tablet Oral QHS  . pantoprazole  40 mg Oral Daily  . potassium chloride  20-40 mEq Oral Once  . sodium chloride flush  3 mL Intravenous Q12H  . warfarin  5 mg Oral ONCE-1800  . Warfarin - Pharmacist Dosing Inpatient   Does not apply q1800   . sodium chloride Stopped (10/14/17 0821)  . sodium chloride 10 mL/hr at 10/14/17 1035   sodium chloride, acetaminophen **OR** acetaminophen, bisacodyl, guaiFENesin-dextromethorphan, hydrALAZINE, labetalol, morphine injection, ondansetron, oxyCODONE-acetaminophen, phenol, polyethylene glycol, sodium chloride flush  Exam:  alert no distress  no jvd or edema  chest clear bilat  cor reg no mrg  abd soft ntnd   R groin w/ wound vac in place  ext no edema  lua avf maturing/ RIJ tdc  Dialysis: [facility] tts  4h  91kg  2/2.5 bath  R IJ TDC/ maturing LUA avf   Hep 2700 - calcitriol 0.25 ug tiw      Impression: 1 R groin/ abd wall abscess - sp I&D 7/3 by [facility], +cx's for serratia / enterococcus, on amox/ cipro po 2 ESRD on HD TTS. HD today.  3 Volume - has lost body wt, edw down 6kg here 4 Anemia ckd - last hb down since admit, will repeat in am 5 MBD ckd - cont meds 6 HTN - stable 7  afib w RVR - back on coreg, HR's stable;  back on coumadin, awaiting INR coming up   Plan - as above   Kelly Splinter MD [place] pager [phone]   10/19/2017, 11:56 AM   Recent Labs  Lab 10/13/17 1422 10/14/17 0808 10/15/17 0716  10/18/17 1016 10/19/17 0328  NA 133* 138 136  --   --   --   K 4.1 4.1 4.1  --   --   --   CL 97* 100 99  --   --   --   CO2 25 28 28   --   --   --   GLUCOSE 102* 112* 113*  --   --   --   BUN 34* 19 27*  --   --   --   CREATININE 5.20* 3.54* 4.04*  --   --   --   CALCIUM 7.9* 8.0* 8.1*  --   --   --   PHOS 3.5  --  3.9  --   --   --   ALBUMIN 2.0*  --  1.8*  --   --   --   INR  --   --   --    < >  1.20 1.10   < > = values in this interval not displayed.   No results for input(s): AST, ALT, ALKPHOS, BILITOT, PROT in the last 168 hours. Recent Labs  Lab 10/14/17 0808 10/15/17 0716  WBC 13.3* 10.1  HGB 10.1* 8.9*  HCT 31.0* 28.2*  MCV 98.7 101.1*  PLT 321 317   Iron/TIBC/Ferritin/ %Sat    Component Value Date/Time   IRON 45 08/31/2017 0427   TIBC 232 (L) 08/31/2017 0427   IRONPCTSAT 19 08/31/2017 0427

## 2017-10-19 NOTE — Care Management Note (Signed)
Case Management Note Marvetta Gibbons RN, BSN Unit 4E-Case Manager [phone]  Patient Details  Name: Aaron [name] MRN: [id number] Date of Birth: 1955/09/11  Subjective/Objective:   Pt admitted with recurrent wound infection- s/p debridement                 Action/Plan: PTA pt lived at home, on last admission was sent home with [facility] wound VAC and [place] with [facility]. - pt does not have wound VAC post debridement on this admission at this time, will most likely need [facility] services for wound care- CM to follow for transition of care needs  Expected Discharge Date:                  Expected Discharge Plan:  [place]  In-House Referral:  NA  Discharge planning Services  CM Consult  Post Acute Care Choice:  [place], Resumption of Svcs/PTA Provider Choice offered to:  Patient  DME Arranged:    DME Agency:     HH Arranged:  RN [place] Agency:  [place]  Status of Service:  Completed, signed off  If discussed at [place] of Stay Meetings, dates discussed:    Discharge Disposition: home/home health   Additional Comments:  10/19/17- 1055- Coron Rossano RN, CM- orders placed for [facility]- for wound care/drsg changes- spoke with pt at bedside- per pt he would like to continue services with [facility]- call made to [facility] with [facility] for resumption of HHRN needs- CM will continue to follow for transition of care.   Dawayne Patricia, RN 10/19/2017, 10:58 AM

## 2017-10-19 NOTE — Progress Notes (Signed)
Patient BP  Elevated over 160 today, Labetalol given as ordered as needed for spb over 160. Will monitor patient. Kaylani Fromme, Bettina Gavia RN

## 2017-10-20 LAB — RENAL FUNCTION PANEL
ANION GAP: 12 (ref 5–15)
Albumin: 2.5 g/dL — ABNORMAL LOW (ref 3.5–5.0)
BUN: 37 mg/dL — ABNORMAL HIGH (ref 8–23)
CHLORIDE: 100 mmol/L (ref 98–111)
CO2: 23 mmol/L (ref 22–32)
Calcium: 9.6 mg/dL (ref 8.9–10.3)
Creatinine, Ser: 5.47 mg/dL — ABNORMAL HIGH (ref 0.61–1.24)
GFR calc non Af Amer: 10 mL/min — ABNORMAL LOW (ref >60)
GFR, EST AFRICAN AMERICAN: 12 mL/min — AB (ref >60)
Glucose, Bld: 98 mg/dL (ref 70–99)
Phosphorus: 4.9 mg/dL — ABNORMAL HIGH (ref 2.5–4.6)
Potassium: 5.1 mmol/L (ref 3.5–5.1)
Sodium: 135 mmol/L (ref 135–145)

## 2017-10-20 LAB — CBC
HCT: 30.5 % — ABNORMAL LOW (ref 39.0–52.0)
HEMOGLOBIN: 9.6 g/dL — AB (ref 13.0–17.0)
MCH: 32 pg (ref 26.0–34.0)
MCHC: 31.5 g/dL (ref 30.0–36.0)
MCV: 101.7 fL — AB (ref 78.0–100.0)
Platelets: 368 10*3/uL (ref 150–400)
RBC: 3 MIL/uL — AB (ref 4.22–5.81)
RDW: 15.1 % (ref 11.5–15.5)
WBC: 7.9 10*3/uL (ref 4.0–10.5)

## 2017-10-20 LAB — PROTIME-INR
INR: 1.2
Prothrombin Time: 15.1 seconds (ref 11.4–15.2)

## 2017-10-20 MED ORDER — DARBEPOETIN ALFA 60 MCG/0.3ML IJ SOSY
PREFILLED_SYRINGE | INTRAMUSCULAR | Status: AC
  Filled 2017-10-20: qty 0.3

## 2017-10-20 MED ORDER — WARFARIN SODIUM 7.5 MG PO TABS
7.5000 mg | ORAL_TABLET | Freq: Once | ORAL | Status: AC
  Administered 2017-10-20: 7.5 mg via ORAL
  Filled 2017-10-20: qty 1

## 2017-10-20 MED ORDER — CALCITRIOL 0.25 MCG PO CAPS
ORAL_CAPSULE | ORAL | Status: AC
  Filled 2017-10-20: qty 1

## 2017-10-20 NOTE — Progress Notes (Signed)
[facility] [place]  Subjective  - Doing OK over all.  Still painful with dressing changes.   Objective (!) 161/99 79 98.5 F (36.9 C) (Oral) 16 100%  Intake/Output Summary (Last 24 hours) at 10/20/2017 0755 Last data filed at 10/19/2017 1900 Gross per 24 hour  Intake 360 ml  Output 2 ml  Net 358 ml    Palpable DP pulses B 2+ Right groin and abdominal dressing intact.  Heart RRR Lungs non labored breathing Gen NAD currently on HD  Assessment/Planning: POD #5incision and drainage of right groin hematoma.Right lower quadrant and inguinal area 08/14/2017 Right illio-femoral endarterectomy  Pending dressing changes and tolerability.   He has a first stage left Basilic vein that was performed 09/04/2017.  We will allow him more healing time with the right groin and abdomin before we perform a second stage transposition on the left UE.   Dressing will be changed later today.   Roxy Horseman 10/20/2017 7:55 AM --  Laboratory Lab Results: No results for input(s): WBC, HGB, HCT, PLT in the last 72 hours. BMET No results for input(s): NA, K, CL, CO2, GLUCOSE, BUN, CREATININE, CALCIUM in the last 72 hours.  COAG Lab Results  Component Value Date   INR 1.20 10/20/2017   INR 1.10 10/19/2017   INR 1.20 10/18/2017   No results found for: PTT

## 2017-10-20 NOTE — Progress Notes (Signed)
ANTICOAGULATION CONSULT NOTE  Pharmacy Consult for Coumadin Indication: atrial fibrillation  No Known Allergies  Patient Measurements: Height: 6\' 4"  (193 cm) Weight: 186 lb 8.2 oz (84.6 kg) IBW/kg (Calculated) : 86.8 Heparin Dosing Weight: n/a  Vital Signs: Temp: 98.5 F (36.9 C) (07/09 1203) Temp Source: Oral (07/09 1203) BP: 148/91 (07/09 1203) Pulse Rate: 25 (07/09 1203)  Labs: Recent Labs    10/18/17 1016 10/19/17 0328 10/20/17 0445 10/20/17 0725 10/20/17 0732  HGB  --   --   --   --  9.6*  HCT  --   --   --   --  30.5*  PLT  --   --   --   --  368  LABPROT 15.1 14.1 15.1  --   --   INR 1.20 1.10 1.20  --   --   CREATININE  --   --   --  5.47*  --     Estimated Creatinine Clearance: 17 mL/min (A) (by C-G formula based on SCr of 5.47 mg/dL (H)).     Assessment: 62 yo male w/ ESRD admitted with R groin wound infection s/p R iliac stent and R femoral endarterectomy.  On chronic Coumadin for Afib which was held for I&D of wound.  Pharmacy asked to resume today.  INR 1.2  PTA Coumadin dose = 5 mg daily except 2.5 on Mon/Thurs  Goal of Therapy:  INR 2-3 Monitor platelets by anticoagulation protocol: Yes   Plan:  1. Coumadin 7. 5 mg x 1 tonight. 2. Daily INR.  Thank you Anette Guarneri, PharmD [phone]  10/20/2017 1:06 PM

## 2017-10-20 NOTE — Progress Notes (Signed)
Groin and abd wound changes - wet-to-dry completed.

## 2017-10-21 LAB — BASIC METABOLIC PANEL
ANION GAP: 12 (ref 5–15)
BUN: 26 mg/dL — AB (ref 8–23)
CHLORIDE: 96 mmol/L — AB (ref 98–111)
CO2: 28 mmol/L (ref 22–32)
Calcium: 9 mg/dL (ref 8.9–10.3)
Creatinine, Ser: 3.97 mg/dL — ABNORMAL HIGH (ref 0.61–1.24)
GFR, EST AFRICAN AMERICAN: 17 mL/min — AB (ref >60)
GFR, EST NON AFRICAN AMERICAN: 15 mL/min — AB (ref >60)
Glucose, Bld: 96 mg/dL (ref 70–99)
POTASSIUM: 5.2 mmol/L — AB (ref 3.5–5.1)
SODIUM: 136 mmol/L (ref 135–145)

## 2017-10-21 LAB — CBC
HCT: 31.3 % — ABNORMAL LOW (ref 39.0–52.0)
HEMOGLOBIN: 9.8 g/dL — AB (ref 13.0–17.0)
MCH: 31.4 pg (ref 26.0–34.0)
MCHC: 31.3 g/dL (ref 30.0–36.0)
MCV: 100.3 fL — ABNORMAL HIGH (ref 78.0–100.0)
Platelets: 326 10*3/uL (ref 150–400)
RBC: 3.12 MIL/uL — AB (ref 4.22–5.81)
RDW: 15.3 % (ref 11.5–15.5)
WBC: 6.5 10*3/uL (ref 4.0–10.5)

## 2017-10-21 LAB — PROTIME-INR
INR: 1.27
PROTHROMBIN TIME: 15.7 s — AB (ref 11.4–15.2)

## 2017-10-21 MED ORDER — NEPRO/CARBSTEADY PO LIQD: 237.0000 mL | Freq: Two times a day (BID) | ORAL | 0 refills | Status: DC

## 2017-10-21 MED ORDER — WARFARIN SODIUM 5 MG PO TABS: 5.0000 mg | ORAL_TABLET | Freq: Every day | ORAL | Status: DC

## 2017-10-21 MED ORDER — OXYCODONE-ACETAMINOPHEN 5-325 MG PO TABS: 1.0000 | ORAL_TABLET | Freq: Four times a day (QID) | ORAL | 0 refills | Status: DC | PRN

## 2017-10-21 MED ORDER — CIPROFLOXACIN HCL 500 MG PO TABS: 500.0000 mg | ORAL_TABLET | Freq: Every day | ORAL | 0 refills | Status: DC

## 2017-10-21 MED ORDER — AMOXICILLIN 500 MG PO CAPS: 500.0000 mg | ORAL_CAPSULE | Freq: Every day | ORAL | 0 refills | Status: DC

## 2017-10-21 NOTE — Plan of Care (Signed)
  Problem: Education: Goal: Knowledge of General Education information will improve Outcome: Adequate for Discharge   Problem: Education: Goal: Knowledge of General Education information will improve Outcome: Adequate for Discharge   Problem: Clinical Measurements: Goal: Ability to maintain clinical measurements within normal limits will improve Outcome: Adequate for Discharge Goal: Will remain free from infection Outcome: Adequate for Discharge Goal: Diagnostic test results will improve Outcome: Adequate for Discharge Goal: Respiratory complications will improve Outcome: Adequate for Discharge Goal: Cardiovascular complication will be avoided Outcome: Adequate for Discharge   Problem: Coping: Goal: Level of anxiety will decrease Outcome: Adequate for Discharge   Problem: Pain Managment: Goal: General experience of comfort will improve Outcome: Adequate for Discharge   Problem: Skin Integrity: Goal: Risk for impaired skin integrity will decrease Outcome: Adequate for Discharge

## 2017-10-21 NOTE — Progress Notes (Addendum)
  Progress Note    10/21/2017 8:01 AM 7 Days Post-Op  Subjective:  Says he wants to go home.  Tolerated dressing change yesterday without IV pain medication.  Afebrile HR  50's-70's NSR 829'F-621'H systolic 08% RA  Vitals:   10/21/17 0415 10/21/17 0456  BP:  (!) 168/86  Pulse:    Resp:  16  Temp: 98.4 F (36.9 C) 98.2 F (36.8 C)  SpO2: 99% 96%    Physical Exam: General:  Sitting in chair-no distress; comfortable Lungs:  Non laboared Incisions:  RLQ incision with beefy red tissue and abx beads present.  Right thigh wound looks good with abx beads in place.     CBC    Component Value Date/Time   WBC 6.5 10/21/2017 0400   RBC 3.12 (L) 10/21/2017 0400   HGB 9.8 (L) 10/21/2017 0400   HCT 31.3 (L) 10/21/2017 0400   PLT 326 10/21/2017 0400   MCV 100.3 (H) 10/21/2017 0400   MCH 31.4 10/21/2017 0400   MCHC 31.3 10/21/2017 0400   RDW 15.3 10/21/2017 0400   LYMPHSABS 0.4 (L) 10/10/2017 0551   MONOABS 0.9 10/10/2017 0551   EOSABS 0.0 10/10/2017 0551   BASOSABS 0.0 10/10/2017 0551    BMET    Component Value Date/Time   NA 136 10/21/2017 0400   K 5.2 (H) 10/21/2017 0400   CL 96 (L) 10/21/2017 0400   CO2 28 10/21/2017 0400   GLUCOSE 96 10/21/2017 0400   BUN 26 (H) 10/21/2017 0400   CREATININE 3.97 (H) 10/21/2017 0400   CALCIUM 9.0 10/21/2017 0400   GFRNONAA 15 (L) 10/21/2017 0400   GFRAA 17 (L) 10/21/2017 0400    INR    Component Value Date/Time   INR 1.27 10/21/2017 0400     Intake/Output Summary (Last 24 hours) at 10/21/2017 0801 Last data filed at 10/20/2017 1630 Gross per 24 hour  Intake 840 ml  Output 2000 ml  Net -1160 ml     Assessment:  62 y.o. male is s/p:  incision and drainage of right groin hematoma.Right lower quadrant and inguinal area 08/14/2017 Right illio-femoral endarterectomy   7 Days Post-Op  Plan: -pt tolerated wound dressing changes today very well without IV pain medication. -wounds are clean.  Continue wet to dry dressing  changes daily.   CM has [place] arranged.   -will discharge home today with 2 weeks po abx and f/u with Dr. Trula Slade in 2 weeks as well.   Discussed with pharmacy and will send home on Cipro 500mg  qhs and amoxicillin qhs x 2 weeks.   Leontine Locket, PA-C [facility] [phone] 10/21/2017 8:01 AM  Ready for discharge.  2 weeks Abx F/U with fistula duplex  [name]

## 2017-10-21 NOTE — Discharge Summary (Signed)
Discharge Summary    IHAN PAT [birth year] 62 y.o. male  [id number]  Admission Date: 10/12/2017  Discharge Date: 10/21/17  Physician: Serafina Mitchell, MD  Admission Diagnosis: Right Groin wound infection post op   HPI:   This is a 62 y.o. male  who is s/p right iliac stent, right femoral endarterectomy, and right SFA stent by Dr. Trula Slade on 08/14/17.  He developed swelling in his right groin that measured 2 x 6 cm by CT scan.  This was thought to be a hematoma and was being managed conservatively.  It slowly increased in size.  He was taken to the operating room on 09/21/17 and underwent I&D of the right groin lymphocele and placement of vac dressing by Dr. Oneida Alar.  The lymphocele cavity was penetrating all the way down to the level of the bovine pericardial patch.  Approximately 50cc of fluid was evacuated.   He presented back to the office on 10/02/17 and saw Dr. Donzetta Matters for increasing pain and swelling.  The wound had healed over the cavity and this was open and there was significant serous fluid drained that relieved his pain.    He was recently admitted to [facility] with swelling and pain on the right side of his abdomen.  He had a CT scan that revealed a RLQ rectus sheath intramuscular hematoma.  There was also an upper right thigh fluid collection just superficial to the femoral bifurcation with sinus tract extending to the skin surface at the site of the wound vac.  He was admitted for further treatment on 10/10/17 and discharged on 10/11/17.  He was advised to hold his coumadin and Plavix.  The also recommended holding off on the wound vac as well.  He presents today for follow up.  He states that his stomach just started swelling up.  States that it turned red and was warm to touch.  He denies any fever or chills at home but he does have a low grade fever here today.  He states he has not been on any abx.   He states that the swelling has moved back down to his penis.  He  states that the tip split  And he has been using a salve that has helped it heal.  He denies any burning with urination. He has been off his coumadin and Plavix since Thursday, October 08, 2017   Hospital Course:  The patient was admitted to the hospital and started on IV abx.  A repeat CT scan was obtained.  The decision was made to take him to the operating room as his erythema was decreased but still present and continued to have persistent fevers.    He was taken to the operating room on 10/14/2017 and underwent: Procedure:   #1:  I&D right groin (Subcuntaeous tissue, fascia and muscle)                         #2:  I&D abdomen (Subcuntaeous tissue, fascia and muscle)                         #3:  Placement of ABX beads   Findings:  Right femoral patch was incorporated.  Fluid collection was evacuated from the right groin.  There did appear to be some purulent appearing tissue from the lower abdomen.  The 2 areas were potentially connected through a subcutaneous tunnel between the 2.   The pt  tolerated the procedure well and was transported to the PACU in good condition.   The next day, he went into afib.  Cardiology was consulted.  Pt was off of anticoagulation due to rectus sheath hematoma.  He was given IV lopressor.  He spontaneously converted back to normal rhythm.    Pt is ESRD and underwent HD during hospitalization.  Post operatively, wet to dry dressing changes were continued.  He was requiring IV pain medication, however, the pain improved with dressing changes and did not longer require IV pain medication by discharge.   ID: His wound culture shows rare Serratia marcescens.  This is sensitive to ceftriaxone.  He is on intravenous ceftriaxone.  In addition to Serratia, reportedly the culture has also grown enterococcus which is sensitive to amoxicillin (per pharmacy).  As the plan is to try to discharge him to home ultimately, we will switch his antibiotics to Cipro and amoxicillin which  covers the Serratia and enterococcus.  His coumadin was restarted on 10/19/17.  On POD 7, he was discharged home with [facility] for wound changes and INR checks.  He was sent home with Cipro and amoxicillin for 2 weeks.Marland Kitchen  He will f/u in 2 weeks with duplex of fistula.  The remainder of the hospital course consisted of increasing mobilization and increasing intake of solids without difficulty.  CBC    Component Value Date/Time   WBC 6.5 10/21/2017 0400   RBC 3.12 (L) 10/21/2017 0400   HGB 9.8 (L) 10/21/2017 0400   HCT 31.3 (L) 10/21/2017 0400   PLT 326 10/21/2017 0400   MCV 100.3 (H) 10/21/2017 0400   MCH 31.4 10/21/2017 0400   MCHC 31.3 10/21/2017 0400   RDW 15.3 10/21/2017 0400   LYMPHSABS 0.4 (L) 10/10/2017 0551   MONOABS 0.9 10/10/2017 0551   EOSABS 0.0 10/10/2017 0551   BASOSABS 0.0 10/10/2017 0551    BMET    Component Value Date/Time   NA 136 10/21/2017 0400   K 5.2 (H) 10/21/2017 0400   CL 96 (L) 10/21/2017 0400   CO2 28 10/21/2017 0400   GLUCOSE 96 10/21/2017 0400   BUN 26 (H) 10/21/2017 0400   CREATININE 3.97 (H) 10/21/2017 0400   CALCIUM 9.0 10/21/2017 0400   GFRNONAA 15 (L) 10/21/2017 0400   GFRAA 17 (L) 10/21/2017 0400      Discharge Instructions    Call MD for:  redness, tenderness, or signs of infection (pain, swelling, bleeding, redness, odor or green/yellow discharge around incision site)   Complete by:  As directed    Call MD for:  severe or increased pain, loss or decreased feeling  in affected limb(s)   Complete by:  As directed    Call MD for:  temperature >100.5   Complete by:  As directed    Discharge instructions   Complete by:  As directed    Resume coumadin as you were taking before admission to the hospital and have home health nurse call to [facility].   Check INR (coumadin levels) on Thursday 10/22/17.   Take your Cipro & Amoxicillin at bedtime.   Discharge wound care:   Complete by:  As directed    Wet to dry saline dressing changes to  RLQ wound and right thigh wound daily.  On the right thigh wound, please use cotton tip applicator to pack medially.   Driving Restrictions   Complete by:  As directed    No driving for 2 weeks and while taking pain medication.  Lifting restrictions   Complete by:  As directed    No heavy lifting for 4 weeks   Resume previous diet   Complete by:  As directed       Discharge Diagnosis:  Right Groin wound infection post op  Secondary Diagnosis: Patient Active Problem List   Diagnosis Date Noted  . Protein-calorie malnutrition, severe 10/15/2017  . Wound infection 10/12/2017  . Hematoma of abdominal wall 10/10/2017  . Seroma due to trauma ([place]) 09/19/2017  . Volume overload   . Groin swelling 08/24/2017  . Hyperlipidemia 08/24/2017  . Duodenitis 04/27/2017  . Schatzki's ring of distal esophagus 04/27/2017  . Encounter for therapeutic drug monitoring 03/03/2017  . Abdominal pain 02/20/2017  . History of hepatitis C 02/20/2017  . Ischemic cardiomyopathy 01/27/2017  . Dyslipidemia 01/27/2017  . AF (paroxysmal atrial fibrillation) ([place]) 01/27/2017  . Preoperative clearance 01/21/2017  . CAD (coronary artery disease) 01/21/2017  . Pain of upper abdomen   . Acute renal failure (ARF) ([place]) 12/16/2016  . Rectal bleeding   . AKI (acute kidney injury) ([place]) 12/15/2016  . CKD (chronic kidney disease), stage V ([place]) 12/15/2016  . Hepatitis C 12/15/2016  . Polycystic kidney disease 12/14/2016  . Jerking 09/23/2014  . Syncope 09/23/2014  . COPD (chronic obstructive pulmonary disease) ([place])   . COLD (chronic obstructive lung disease) ([place])   . Depression   . PAD (peripheral artery disease) ([place]) 09/19/2014  . Preop cardiovascular exam 10/28/2010  . Tobacco abuse 10/28/2010  . SYNCOPE 05/07/2010  . UNSPECIFIED IRON DEFICIENCY ANEMIA 10/04/2009  . 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  . Essential hypertension 10/04/2009  . Esophageal reflux 10/04/2009  . PRECORDIAL PAIN 10/04/2009    Past Medical History:  Diagnosis Date  . Anxiety   . Arthritis    knees , back , shoulders (10/12/2017)  . CAD (coronary artery disease)    Mild nonobstructive disease at cardiac catheterization 2002  . Chronic back pain    "back of my neck; down thru my legs" (10/12/2017)  . COPD (chronic obstructive pulmonary disease) ([place])   . Diverticulitis   . Esophageal reflux   . ESRD (end stage renal disease) on dialysis ([place])    "TTS; [place]" (10/12/2017)  . Essential hypertension   . Hepatitis C    states he no longer has this  . History of kidney stones   . History of syncope   . Hyperlipidemia   . Myocardial infarction ([place]) 02/2017   "light one" (10/12/2017)  . PAT (paroxysmal atrial tachycardia) ([place])   . Peripheral arterial disease ([place])    Occluded left superficial femoral artery status post stent June 2016 - Dr. Trula Slade  . Pneumonia 1961  . Polycystic kidney, unspecified type   . Syncope 09/2014     Allergies as of 10/21/2017   No Known Allergies     Medication List    TAKE these medications   amoxicillin 500 MG capsule Commonly known as:  AMOXIL Take 1 capsule (500 mg total) by mouth at bedtime.   BREO ELLIPTA 200-25 MCG/INH Aepb Generic drug:  fluticasone furoate-vilanterol Inhale 1 puff into the lungs daily as needed (wheezing).   calcitRIOL 0.25 MCG capsule Commonly known as:  ROCALTROL Take 1 capsule (0.25 mcg total) by mouth Every Tuesday,Thursday,and Saturday with dialysis.   calcium acetate 667 MG capsule Commonly known as:  PHOSLO Take 667 mg by mouth daily.   carvedilol 25 MG tablet Commonly known as:  COREG Take 1 tablet (25 mg total) 2 (two) times  daily by mouth.   cholecalciferol 1000 units tablet Commonly known as:  VITAMIN D Take 2,000 Units by mouth daily.   ciprofloxacin 500 MG tablet Commonly known as:  CIPRO Take 1 tablet (500 mg total) by mouth at bedtime.   DIALYVITE 800 0.8 MG Tabs Take 1 tablet by mouth daily.   feeding  supplement (NEPRO CARB STEADY) Liqd Take 237 mLs by mouth 2 (two) times daily between meals.   oxyCODONE-acetaminophen 5-325 MG tablet Commonly known as:  PERCOCET/ROXICET Take 1 tablet by mouth every 6 (six) hours as needed.   pantoprazole 40 MG tablet Commonly known as:  PROTONIX Take 1 tablet (40 mg total) by mouth 2 (two) times daily.   warfarin 5 MG tablet Commonly known as:  COUMADIN Take as directed. If you are unsure how to take this medication, talk to your nurse or doctor. Original instructions:  Take 1 tablet (5 mg total) by mouth daily.            Discharge Care Instructions  (From admission, onward)        Start     Ordered   10/21/17 0000  Discharge wound care:    Comments:  Wet to dry saline dressing changes to RLQ wound and right thigh wound daily.  On the right thigh wound, please use cotton tip applicator to pack medially.   10/21/17 0827      Prescriptions given: 1.  Roxicet #30 No Refill 2.  Cipro 500mg  qhs #14 No Refill 3.  Amoxicillin 500mg  qhs #14 No Refill  Instructions: 1.  No driving for 2 weeks and while taking pain medication 2.  No heavy lifting x 4 weeks 3.  Wet to dry saline dressing changes daily 4.  Check INR with [facility] on 10/22/17 and call results to [facility]  Disposition: home  Patient's condition: is Good  Follow up: 1. Dr. 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    Leontine Locket, PA-C [facility] [phone] 10/21/2017  8:30 AM

## 2017-10-21 NOTE — Care Management Important Message (Signed)
Important Message  Patient Details  Name: Aaron [name] MRN: [id number] Date of Birth: [birth year]   Medicare Important Message Given:  Yes    Itzayanna Kaster P Nina 10/21/2017, 10:07 AM

## 2017-10-21 NOTE — Care Management Note (Signed)
Case Management Note Marvetta Gibbons RN, BSN Unit 4E-Case Manager [phone]  Patient Details  Name: Aaron [name] MRN: [id number] Date of Birth: [birth year]  Subjective/Objective:   Pt admitted with recurrent wound infection- s/p debridement                 Action/Plan: PTA pt lived at home, on last admission was sent home with [facility] wound VAC and [place] with [facility]. - pt does not have wound VAC post debridement on this admission at this time, will most likely need [facility] services for wound care- CM to follow for transition of care needs  Expected Discharge Date:  10/21/17               Expected Discharge Plan:  [place]  In-House Referral:  NA  Discharge planning Services  CM Consult  Post Acute Care Choice:  [place], Resumption of Svcs/PTA Provider Choice offered to:  Patient  DME Arranged:    DME Agency:     HH Arranged:  RN [place] Agency:  [facility]  Status of Service:  Completed, signed off  If discussed at [place] of Stay Meetings, dates discussed:    Discharge Disposition: home/home health   Additional Comments:  10/21/17- 1100- Aaron Jeune RN,CM- pt for d/c home today, have notified Butch Penny with [facility] for [facility] services- start of care will be tomorrow 7/11.   10/19/17- 47- Aaron Vanepps RN, CM- orders placed for [facility]- for wound care/drsg changes- spoke with pt at bedside- per pt he would like to continue services with [facility]- call made to [facility] with [facility] for resumption of HHRN needs- CM will continue to follow for transition of care.   Dawayne Patricia, RN 10/21/2017, 11:11 AM

## 2017-10-21 NOTE — Progress Notes (Signed)
Discharge teaching complete. Meds, diet, activity, follow up appointments and wound care covered and all questions answered. Copy of instructions and prescriptions given to patient.

## 2017-10-21 NOTE — Progress Notes (Signed)
Aaron [name] for Coumadin Indication: atrial fibrillation  No Known Allergies  Patient Measurements: Height: 6\' 4"  (193 cm) Weight: 192 lb 14.4 oz (87.5 kg) IBW/kg (Calculated) : 86.8 Heparin Dosing Weight: n/a  Vital Signs: Temp: 98.2 F (36.8 C) (07/10 0456) Temp Source: Oral (07/10 0456) BP: 168/86 (07/10 0456)  Labs: Recent Labs    10/19/17 0328 10/20/17 0445 10/20/17 0725 10/20/17 0732 10/21/17 0400  HGB  --   --   --  9.6* 9.8*  HCT  --   --   --  30.5* 31.3*  PLT  --   --   --  368 326  LABPROT 14.1 15.1  --   --  15.7*  INR 1.10 1.20  --   --  1.27  CREATININE  --   --  5.47*  --  3.97*    Estimated Creatinine Clearance: 24 mL/min (A) (by C-G formula based on SCr of 3.97 mg/dL (H)).   Assessment: 62 yo male w/ ESRD admitted with R groin wound infection s/p R iliac stent and R femoral endarterectomy.  On chronic Coumadin for Afib which was held for I&D of wound.  Pharmacy asked to resume.  INR 1.27 today.   PTA Coumadin dose = 5 mg daily except 2.5 on Mon/Thurs  Goal of Therapy:  INR 2-3 Monitor platelets by anticoagulation protocol: Yes   Plan:  1. Coumadin 5mg  daily 2. Recommend coumadin 5mg  daily at discharge 3. Daily INR  Juanell Fairly, PharmD PGY1 Pharmacy Resident Phone [phone] 10/21/2017 10:42 AM

## 2017-10-21 NOTE — Progress Notes (Addendum)
[facility] Progress Note   Subjective:  Seen in room.  No complaints. Going home today.   Objective Vitals:   10/20/17 2015 10/21/17 0415 10/21/17 0419 10/21/17 0456  BP: (!) 155/88   (!) 168/86  Pulse: 75     Resp:    16  Temp: 98.3 F (36.8 C) 98.4 F (36.9 C)  98.2 F (36.8 C)  TempSrc: Oral   Oral  SpO2: 100% 99%  96%  Weight:   87.5 kg (192 lb 14.4 oz)   Height:       Physical Exam General: WNWD  Heart: RRR Lungs: CTAB  Abdomen: soft NT  Extremities: No LE edema  Dialysis Access: R IJ TDC/ maturing LUE AVF +bruit    Dialysis: [facility]  4h  91kg  2/2.5 bath  R IJ TDC/ maturing LUA avf   Hep 2700 - calcitriol 0.25 ug tiw      Impression: 1 R groin/ abd wall abscess - sp I&D 7/3 by [facility], +cx's for serratia / enterococcus, on amox/ cipro po 2 ESRD on HD TTS. Next HD tomorrow at outpatient unit.  3 Volume - has lost body wt, edw down 6kg here. Post HD weight on 7/9  84.6kg  4 Anemia ckd - Hgb 9.8. Aranesp 60 dosed 7/9.  5 MBD ckd - cont meds 6 HTN - stable 7  afib w RVR - back on coreg, HR's stable; back on coumadin INR 1.27    Lynnda Child PA-C [facility] Pager [phone] 10/21/2017,11:08 AM  LOS: 9 days   Pt seen, examined and agree w A/P as above. For discharge today.  Kelly Splinter MD [facility] pager [phone]   10/21/2017, 11:37 AM    Additional Objective Labs: Basic Metabolic Panel: Recent Labs  Lab 10/15/17 0716 10/20/17 0725 10/21/17 0400  NA 136 135 136  K 4.1 5.1 5.2*  CL 99 100 96*  CO2 28 23 28   GLUCOSE 113* 98 96  BUN 27* 37* 26*  CREATININE 4.04* 5.47* 3.97*  CALCIUM 8.1* 9.6 9.0  PHOS 3.9 4.9*  --    CBC: Recent Labs  Lab 10/15/17 0716 10/20/17 0732 10/21/17 0400  WBC 10.1 7.9 6.5  HGB 8.9* 9.6* 9.8*  HCT 28.2* 30.5* 31.3*  MCV 101.1* 101.7* 100.3*  PLT 317 368 326   Blood Culture    Component Value Date/Time   SDES WOUND ABDOMEN 10/14/2017 1144    SPECREQUEST SPECIMEN B 10/14/2017 1144   CULT  10/14/2017 1144    RARE SERRATIA MARCESCENS NO ANAEROBES ISOLATED Performed at [facility], [place] [address] [place], [place] [address]    REPTSTATUS 10/19/2017 FINAL 10/14/2017 1144    Cardiac Enzymes: No results for input(s): CKTOTAL, CKMB, CKMBINDEX, TROPONINI in the last 168 hours. CBG: No results for input(s): GLUCAP in the last 168 hours. Iron Studies: No results for input(s): IRON, TIBC, TRANSFERRIN, FERRITIN in the last 72 hours. Lab Results  Component Value Date   INR 1.27 10/21/2017   INR 1.20 10/20/2017   INR 1.10 10/19/2017   Medications: . sodium chloride Stopped (10/14/17 0821)  . sodium chloride 10 mL/hr at 10/14/17 1035   . amoxicillin  500 mg Oral QHS  . calcitRIOL  0.25 mcg Oral Q T,Th,Sa-HD  . calcium acetate  667 mg Oral TID WC  . carvedilol  25 mg Oral BID WC  . Chlorhexidine Gluconate Cloth  6 each Topical Q0600  . Chlorhexidine Gluconate Cloth  6 each Topical  R9458  . ciprofloxacin  500 mg Oral QHS  . darbepoetin (ARANESP) injection - DIALYSIS  60 mcg Intravenous Q Tue-HD  . feeding supplement (NEPRO CARB STEADY)  237 mL Oral BID BM  . multivitamin  1 tablet Oral QHS  . pantoprazole  40 mg Oral Daily  . potassium chloride  20-40 mEq Oral Once  . sodium chloride flush  3 mL Intravenous Q12H  . warfarin  5 mg Oral q1800  . Warfarin - Pharmacist Dosing Inpatient   Does not apply [phone]

## 2017-10-22 ENCOUNTER — Ambulatory Visit (INDEPENDENT_AMBULATORY_CARE_PROVIDER_SITE_OTHER): Payer: 59 | Admitting: *Deleted

## 2017-10-22 ENCOUNTER — Telehealth: Payer: Self-pay | Admitting: Surgery

## 2017-10-22 DIAGNOSIS — I480 Paroxysmal atrial fibrillation: Secondary | ICD-10-CM

## 2017-10-22 DIAGNOSIS — Z5181 Encounter for therapeutic drug level monitoring: Secondary | ICD-10-CM | POA: Diagnosis not present

## 2017-10-22 LAB — POCT INR: INR: 1.5 — AB (ref 2.0–3.0)

## 2017-10-22 NOTE — Telephone Encounter (Signed)
sch appt 11/04/17 4pm Dialysis Duplex 11/05/17 845am wound check MD

## 2017-10-22 NOTE — Patient Instructions (Signed)
Pt states he has been taking 5mg  daily except 2.5mg  on Fridays and Saturdays because he can remember those 2 days.   Pt to take coumadin 5mg  daily except 2.5mg  on Saturday and recheck INR on Monday Order given to [place] 7/15

## 2017-10-23 ENCOUNTER — Other Ambulatory Visit: Payer: Self-pay

## 2017-10-23 DIAGNOSIS — N185 Chronic kidney disease, stage 5: Secondary | ICD-10-CM

## 2017-10-26 ENCOUNTER — Ambulatory Visit (INDEPENDENT_AMBULATORY_CARE_PROVIDER_SITE_OTHER): Payer: 59 | Admitting: Cardiology

## 2017-10-26 DIAGNOSIS — Z5181 Encounter for therapeutic drug level monitoring: Secondary | ICD-10-CM

## 2017-10-26 DIAGNOSIS — I480 Paroxysmal atrial fibrillation: Secondary | ICD-10-CM | POA: Diagnosis not present

## 2017-10-26 LAB — POCT INR: INR: 2.2 (ref 2.0–3.0)

## 2017-10-30 ENCOUNTER — Ambulatory Visit (INDEPENDENT_AMBULATORY_CARE_PROVIDER_SITE_OTHER): Payer: 59 | Admitting: *Deleted

## 2017-10-30 DIAGNOSIS — Z5181 Encounter for therapeutic drug level monitoring: Secondary | ICD-10-CM | POA: Diagnosis not present

## 2017-10-30 DIAGNOSIS — I480 Paroxysmal atrial fibrillation: Secondary | ICD-10-CM

## 2017-10-30 LAB — POCT INR: INR: 2 (ref 2.0–3.0)

## 2017-10-30 NOTE — Patient Instructions (Signed)
Continue coumadin 5mg  daily except 2.5mg  on Saturday and recheck INR in 1 week Order given to [facility]

## 2017-11-02 ENCOUNTER — Ambulatory Visit (INDEPENDENT_AMBULATORY_CARE_PROVIDER_SITE_OTHER): Payer: 59 | Admitting: *Deleted

## 2017-11-02 DIAGNOSIS — Z5181 Encounter for therapeutic drug level monitoring: Secondary | ICD-10-CM

## 2017-11-02 DIAGNOSIS — I480 Paroxysmal atrial fibrillation: Secondary | ICD-10-CM

## 2017-11-02 LAB — POCT INR: INR: 2.6 (ref 2.0–3.0)

## 2017-11-02 NOTE — Patient Instructions (Addendum)
Continue coumadin 5mg  daily except 2.5mg  on Fridays and recheck INR in 1 week Order given to Aaron Mcgregor LPN [facility]

## 2017-11-04 ENCOUNTER — Ambulatory Visit (HOSPITAL_COMMUNITY)
Admission: RE | Admit: 2017-11-04 | Discharge: 2017-11-04 | Disposition: A | Discharge status: Home / Self Care | Payer: 59 | Source: Ambulatory Visit | Attending: Family | Admitting: Family

## 2017-11-04 DIAGNOSIS — Z4931 Encounter for adequacy testing for hemodialysis: Secondary | ICD-10-CM | POA: Insufficient documentation

## 2017-11-04 DIAGNOSIS — N185 Chronic kidney disease, stage 5: Secondary | ICD-10-CM

## 2017-11-05 ENCOUNTER — Encounter: Payer: Self-pay | Admitting: Surgery

## 2017-11-05 ENCOUNTER — Other Ambulatory Visit: Payer: Self-pay | Admitting: *Deleted

## 2017-11-05 ENCOUNTER — Telehealth: Payer: Self-pay | Admitting: *Deleted

## 2017-11-05 ENCOUNTER — Ambulatory Visit (INDEPENDENT_AMBULATORY_CARE_PROVIDER_SITE_OTHER): Payer: Self-pay | Admitting: Surgery

## 2017-11-05 ENCOUNTER — Encounter: Payer: Self-pay | Admitting: *Deleted

## 2017-11-05 VITALS — BP 194/100 | HR 80 | Temp 98.6°F | Resp 20 | Ht 76.0 in | Wt 186.0 lb

## 2017-11-05 DIAGNOSIS — Z992 Dependence on renal dialysis: Secondary | ICD-10-CM

## 2017-11-05 DIAGNOSIS — I70213 Atherosclerosis of native arteries of extremities with intermittent claudication, bilateral legs: Secondary | ICD-10-CM

## 2017-11-05 DIAGNOSIS — N186 End stage renal disease: Secondary | ICD-10-CM

## 2017-11-05 MED ORDER — OXYCODONE HCL 5 MG PO TABS: 5.0000 mg | ORAL_TABLET | Freq: Three times a day (TID) | ORAL | 0 refills | Status: DC | PRN

## 2017-11-05 NOTE — Progress Notes (Signed)
.                                    Patient name: Aaron [name] MRN: [id number] DOB: Jul 10, 1955 Sex: male  REASON FOR VISIT:     post op  HISTORY OF PRESENT ILLNESS:   This is a 62 y.o. male who is s/p right iliac stent, right femoral endarterectomy, and right SFA stent by Dr. Trula Slade on 08/14/17.  He developed swelling in his right groin that measured 2 x 6 cm by CT scan.  This was thought to be a hematoma and was being managed conservatively.  It slowly increased in size.  He was taken to the operating room on 09/21/17 and underwent I&D of the right groin lymphocele and placement of vac dressing by Dr. Oneida Alar.  The lymphocele cavity was penetrating all the way down to the level of the bovine pericardial patch.  Approximately 50cc of fluid was evacuated.   He presented back to the office on 10/02/17 and saw Dr. Donzetta Matters for increasing pain and swelling.  The wound had healed over the cavity and this was open and there was significant serous fluid drained that relieved his pain.    He was recently admitted to [facility] with swelling and pain on the right side of his abdomen.  He had a CT scan that revealed a RLQ rectus sheath intramuscular hematoma.  There was also an upper right thigh fluid collection just superficial to the femoral bifurcation with sinus tract extending to the skin surface at the site of the wound vac.  On 10/14/2017 he underwent I&D of his right groin and abdomen with placement of antibiotic beads.  He is here today for follow-up.  CURRENT MEDICATIONS:    Current Outpatient Medications  Medication Sig Dispense Refill  . amoxicillin (AMOXIL) 500 MG capsule Take 1 capsule (500 mg total) by mouth at bedtime. 14 capsule 0  . B Complex-C-Folic Acid (DIALYVITE 242) 0.8 MG TABS Take 1 tablet by mouth daily.  3  . BREO ELLIPTA 200-25 MCG/INH AEPB Inhale 1 puff into the lungs daily as needed (wheezing).     . calcium acetate (PHOSLO) 667 MG capsule Take 667 mg by mouth daily.  3    . cholecalciferol (VITAMIN D) 1000 units tablet Take 2,000 Units by mouth daily.    . Nutritional Supplements (FEEDING SUPPLEMENT, NEPRO CARB STEADY,) LIQD Take 237 mLs by mouth 2 (two) times daily between meals.  0  . warfarin (COUMADIN) 5 MG tablet Take 1 tablet (5 mg total) by mouth daily.    . calcitRIOL (ROCALTROL) 0.25 MCG capsule Take 1 capsule (0.25 mcg total) by mouth Every Tuesday,Thursday,and Saturday with dialysis. (Patient not taking: Reported on 10/13/2017) 30 capsule 0  . carvedilol (COREG) 25 MG tablet Take 1 tablet (25 mg total) 2 (two) times daily by mouth. 180 tablet 3  . ciprofloxacin (CIPRO) 500 MG tablet Take 1 tablet (500 mg total) by mouth at bedtime. (Patient not taking: Reported on 11/05/2017) 14 tablet 0  . oxyCODONE-acetaminophen (PERCOCET/ROXICET) 5-325 MG tablet Take 1 tablet by mouth every 6 (six) hours as needed. (Patient not taking: Reported on 11/05/2017) 30 tablet 0  . pantoprazole (PROTONIX) 40 MG tablet Take 1 tablet (40 mg total) by mouth 2 (two) times daily. 60 tablet 1   No current facility-administered medications for this visit.     REVIEW OF SYSTEMS:   [X]   denotes positive finding, [ ]  denotes negative finding Cardiac  Comments:  Chest pain or chest pressure:    Shortness of breath upon exertion:    Short of breath when lying flat:    Irregular heart rhythm:    Constitutional    Fever or chills:      PHYSICAL EXAM:   Vitals:   11/05/17 0853 11/05/17 0855  BP: (!) 197/103 (!) 194/100  Pulse: 80   Resp: 20   Temp: 98.6 F (37 C)   TempSrc: Oral   SpO2: 100%   Weight: 186 lb (84.4 kg)   Height: 6\' 4"  (1.93 m)     GENERAL: The patient is a well-nourished male, in no acute distress. The vital signs are documented above. CARDIOVASCULAR: There is a regular rate and rhythm. PULMONARY: Non-labored respirations The right lower quadrant abdominal incision has healed.  The right groin incision has nearly healed  STUDIES:    +------------+----------+-------------+----------+--------+ OUTFLOW VEINPSV (cm/s)Diameter (cm)Depth (cm)Describe +------------+----------+-------------+----------+--------+ Prox UA     124    1.04     0.31       +------------+----------+-------------+----------+--------+ Mid UA     185    0.70     0.39       +------------+----------+-------------+----------+--------+ Dist UA     213    0.72     0.37       +------------+----------+-------------+----------+--------+ AC Fossa    743    0.76     0.16       +------------+----------+-------------+----------+--------+     MEDICAL ISSUES:   Dry dressing to right groin incision Patient will be scheduled for a second stage left basilic vein fistula on August 9.  I will stop his Plavix and Coumadin prior to his procedure.  All of his questions were answered today.  [name], MD [facility] [phone] Pager [phone]

## 2017-11-05 NOTE — H&P (View-Only) (Signed)
.                                    Patient name: Aaron [name] MRN: [id number] DOB: 1955-09-28 Sex: male  REASON FOR VISIT:     post op  HISTORY OF PRESENT ILLNESS:   This is a 62 y.o. male who is s/p right iliac stent, right femoral endarterectomy, and right SFA stent by Dr. Trula Slade on 08/14/17.  He developed swelling in his right groin that measured 2 x 6 cm by CT scan.  This was thought to be a hematoma and was being managed conservatively.  It slowly increased in size.  He was taken to the operating room on 09/21/17 and underwent I&D of the right groin lymphocele and placement of vac dressing by Dr. Oneida Alar.  The lymphocele cavity was penetrating all the way down to the level of the bovine pericardial patch.  Approximately 50cc of fluid was evacuated.   He presented back to the office on 10/02/17 and saw Dr. Donzetta Matters for increasing pain and swelling.  The wound had healed over the cavity and this was open and there was significant serous fluid drained that relieved his pain.    He was recently admitted to [facility] with swelling and pain on the right side of his abdomen.  He had a CT scan that revealed a RLQ rectus sheath intramuscular hematoma.  There was also an upper right thigh fluid collection just superficial to the femoral bifurcation with sinus tract extending to the skin surface at the site of the wound vac.  On 10/14/2017 he underwent I&D of his right groin and abdomen with placement of antibiotic beads.  He is here today for follow-up.  CURRENT MEDICATIONS:    Current Outpatient Medications  Medication Sig Dispense Refill  . amoxicillin (AMOXIL) 500 MG capsule Take 1 capsule (500 mg total) by mouth at bedtime. 14 capsule 0  . B Complex-C-Folic Acid (DIALYVITE 734) 0.8 MG TABS Take 1 tablet by mouth daily.  3  . BREO ELLIPTA 200-25 MCG/INH AEPB Inhale 1 puff into the lungs daily as needed (wheezing).     . calcium acetate (PHOSLO) 667 MG capsule Take 667 mg by mouth daily.  3    . cholecalciferol (VITAMIN D) 1000 units tablet Take 2,000 Units by mouth daily.    . Nutritional Supplements (FEEDING SUPPLEMENT, NEPRO CARB STEADY,) LIQD Take 237 mLs by mouth 2 (two) times daily between meals.  0  . warfarin (COUMADIN) 5 MG tablet Take 1 tablet (5 mg total) by mouth daily.    . calcitRIOL (ROCALTROL) 0.25 MCG capsule Take 1 capsule (0.25 mcg total) by mouth Every Tuesday,Thursday,and Saturday with dialysis. (Patient not taking: Reported on 10/13/2017) 30 capsule 0  . carvedilol (COREG) 25 MG tablet Take 1 tablet (25 mg total) 2 (two) times daily by mouth. 180 tablet 3  . ciprofloxacin (CIPRO) 500 MG tablet Take 1 tablet (500 mg total) by mouth at bedtime. (Patient not taking: Reported on 11/05/2017) 14 tablet 0  . oxyCODONE-acetaminophen (PERCOCET/ROXICET) 5-325 MG tablet Take 1 tablet by mouth every 6 (six) hours as needed. (Patient not taking: Reported on 11/05/2017) 30 tablet 0  . pantoprazole (PROTONIX) 40 MG tablet Take 1 tablet (40 mg total) by mouth 2 (two) times daily. 60 tablet 1   No current facility-administered medications for this visit.     REVIEW OF SYSTEMS:   [X]   denotes positive finding, [ ]  denotes negative finding Cardiac  Comments:  Chest pain or chest pressure:    Shortness of breath upon exertion:    Short of breath when lying flat:    Irregular heart rhythm:    Constitutional    Fever or chills:      PHYSICAL EXAM:   Vitals:   11/05/17 0853 11/05/17 0855  BP: (!) 197/103 (!) 194/100  Pulse: 80   Resp: 20   Temp: 98.6 F (37 C)   TempSrc: Oral   SpO2: 100%   Weight: 186 lb (84.4 kg)   Height: 6\' 4"  (1.93 m)     GENERAL: The patient is a well-nourished male, in no acute distress. The vital signs are documented above. CARDIOVASCULAR: There is a regular rate and rhythm. PULMONARY: Non-labored respirations The right lower quadrant abdominal incision has healed.  The right groin incision has nearly healed  STUDIES:    +------------+----------+-------------+----------+--------+ OUTFLOW VEINPSV (cm/s)Diameter (cm)Depth (cm)Describe +------------+----------+-------------+----------+--------+ Prox UA     124    1.04     0.31       +------------+----------+-------------+----------+--------+ Mid UA     185    0.70     0.39       +------------+----------+-------------+----------+--------+ Dist UA     213    0.72     0.37       +------------+----------+-------------+----------+--------+ AC Fossa    743    0.76     0.16       +------------+----------+-------------+----------+--------+     MEDICAL ISSUES:   Dry dressing to right groin incision Patient will be scheduled for a second stage left basilic vein fistula on August 9.  I will stop his Plavix and Coumadin prior to his procedure.  All of his questions were answered today.  [name], MD [facility] [phone] Pager [phone]

## 2017-11-05 NOTE — Telephone Encounter (Signed)
-----   Message from Willy Eddy, RN sent at 11/05/2017  9:32 AM EDT ----- Regarding: Coumadin hold for surgery.  Requesting medication clearance and lovenox bridging if indicated. 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 Trula Slade. Will need to hold Coumadin for 5 days pre-op.  Gomez Cleverly [facility] [place]/ [phone] fax [phone]

## 2017-11-05 NOTE — Telephone Encounter (Signed)
-----   Message from Malen Gauze, RN sent at 11/05/2017 11:43 AM EDT ----- Regarding: RE: Coumadin hold for surgery.  Chart reviewed.  OK to hold coumadin 5 days before procedure and resume night of procedure if OK with MD.  Does not need Lovenox bridge. Thanks, Lattie Haw ----- Message ----- From: Willy Eddy, RN Sent: 11/05/2017   9:32 AM To: Willy Eddy, RN, Malen Gauze, RN Subject: Coumadin hold for surgery.                     Requesting medication clearance and lovenox bridging if indicated. 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 Trula Slade. Will need to hold Coumadin for 5 days pre-op.  Gomez Cleverly [facility] [place]/ [phone] fax [phone]

## 2017-11-09 ENCOUNTER — Encounter: Payer: Self-pay | Admitting: *Deleted

## 2017-11-09 ENCOUNTER — Telehealth: Payer: Self-pay | Admitting: *Deleted

## 2017-11-09 ENCOUNTER — Ambulatory Visit (INDEPENDENT_AMBULATORY_CARE_PROVIDER_SITE_OTHER): Payer: 59 | Admitting: *Deleted

## 2017-11-09 DIAGNOSIS — I480 Paroxysmal atrial fibrillation: Secondary | ICD-10-CM

## 2017-11-09 DIAGNOSIS — Z5181 Encounter for therapeutic drug level monitoring: Secondary | ICD-10-CM

## 2017-11-09 LAB — POCT INR: INR: 3.2 — AB (ref 2.0–3.0)

## 2017-11-09 NOTE — Patient Instructions (Signed)
Hold coumadin tonight then resume 5mg  daily except 2.5mg  on Fridays.  Pending surgery on graft on 8/9.  OK to hold coumadin 5 days before procedure.  Take last dose on 8/3 and resume night of procedure at above dose if OK with surgeon and recheck INR in 1 week Order given to Elray Mcgregor LPN [place] made in office.

## 2017-11-09 NOTE — Telephone Encounter (Signed)
INR 3.2 / please call with instructions/tg

## 2017-11-09 NOTE — Progress Notes (Signed)
This encounter was created in error - please disregard.

## 2017-11-18 ENCOUNTER — Other Ambulatory Visit: Payer: Self-pay

## 2017-11-18 ENCOUNTER — Encounter (HOSPITAL_COMMUNITY): Payer: Self-pay | Admitting: *Deleted

## 2017-11-18 NOTE — Progress Notes (Signed)
Spoke with pt for pre-op call. 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 Denies any recent chest pain or sob. Pt has hx of A-fib, is on Coumadin. Last dose was 11/14/17. Pt states he is not diabetic.

## 2017-11-20 ENCOUNTER — Telehealth: Payer: Self-pay | Admitting: *Deleted

## 2017-11-20 ENCOUNTER — Encounter (HOSPITAL_COMMUNITY)
Admission: RE | Disposition: A | Discharge status: Home / Self Care | Payer: Self-pay | Source: Ambulatory Visit | Attending: Surgery

## 2017-11-20 ENCOUNTER — Encounter (HOSPITAL_COMMUNITY): Payer: Self-pay | Admitting: *Deleted

## 2017-11-20 ENCOUNTER — Ambulatory Visit (HOSPITAL_COMMUNITY): Payer: 59 | Admitting: Anesthesiology

## 2017-11-20 ENCOUNTER — Ambulatory Visit (HOSPITAL_COMMUNITY)
Admission: RE | Admit: 2017-11-20 | Discharge: 2017-11-20 | Disposition: A | Discharge status: Home / Self Care | Payer: 59 | Source: Ambulatory Visit | Attending: Surgery | Admitting: Surgery

## 2017-11-20 DIAGNOSIS — M7989 Other specified soft tissue disorders: Secondary | ICD-10-CM | POA: Diagnosis not present

## 2017-11-20 DIAGNOSIS — Z95820 Peripheral vascular angioplasty status with implants and grafts: Secondary | ICD-10-CM | POA: Insufficient documentation

## 2017-11-20 DIAGNOSIS — N186 End stage renal disease: Secondary | ICD-10-CM | POA: Insufficient documentation

## 2017-11-20 DIAGNOSIS — F17200 Nicotine dependence, unspecified, uncomplicated: Secondary | ICD-10-CM | POA: Insufficient documentation

## 2017-11-20 DIAGNOSIS — Z7901 Long term (current) use of anticoagulants: Secondary | ICD-10-CM | POA: Insufficient documentation

## 2017-11-20 DIAGNOSIS — K219 Gastro-esophageal reflux disease without esophagitis: Secondary | ICD-10-CM | POA: Diagnosis not present

## 2017-11-20 DIAGNOSIS — I4891 Unspecified atrial fibrillation: Secondary | ICD-10-CM | POA: Diagnosis not present

## 2017-11-20 DIAGNOSIS — Z79899 Other long term (current) drug therapy: Secondary | ICD-10-CM | POA: Diagnosis not present

## 2017-11-20 DIAGNOSIS — J449 Chronic obstructive pulmonary disease, unspecified: Secondary | ICD-10-CM | POA: Diagnosis not present

## 2017-11-20 DIAGNOSIS — N185 Chronic kidney disease, stage 5: Secondary | ICD-10-CM

## 2017-11-20 DIAGNOSIS — I252 Old myocardial infarction: Secondary | ICD-10-CM | POA: Diagnosis not present

## 2017-11-20 DIAGNOSIS — Z981 Arthrodesis status: Secondary | ICD-10-CM | POA: Diagnosis not present

## 2017-11-20 DIAGNOSIS — M1990 Unspecified osteoarthritis, unspecified site: Secondary | ICD-10-CM | POA: Insufficient documentation

## 2017-11-20 DIAGNOSIS — I120 Hypertensive chronic kidney disease with stage 5 chronic kidney disease or end stage renal disease: Secondary | ICD-10-CM | POA: Insufficient documentation

## 2017-11-20 DIAGNOSIS — I2510 Atherosclerotic heart disease of native coronary artery without angina pectoris: Secondary | ICD-10-CM | POA: Diagnosis not present

## 2017-11-20 DIAGNOSIS — Z9889 Other specified postprocedural states: Secondary | ICD-10-CM | POA: Diagnosis not present

## 2017-11-20 HISTORY — DX: Unspecified atrial fibrillation: I48.91

## 2017-11-20 HISTORY — PX: BASCILIC VEIN TRANSPOSITION: SHX5742

## 2017-11-20 LAB — POCT I-STAT 4, (NA,K, GLUC, HGB,HCT)
GLUCOSE: 89 mg/dL (ref 70–99)
HCT: 40 % (ref 39.0–52.0)
Hemoglobin: 13.6 g/dL (ref 13.0–17.0)
POTASSIUM: 3.4 mmol/L — AB (ref 3.5–5.1)
Sodium: 138 mmol/L (ref 135–145)

## 2017-11-20 SURGERY — TRANSPOSITION, VEIN, BASILIC
Anesthesia: Monitor Anesthesia Care | Site: Arm Upper | Laterality: Left

## 2017-11-20 MED ORDER — OXYCODONE HCL 5 MG/5ML PO SOLN: 5.0000 mg | Freq: Once | ORAL | Status: AC | PRN

## 2017-11-20 MED ORDER — SODIUM CHLORIDE 0.9 % IV SOLN
INTRAVENOUS | Status: DC | PRN
  Administered 2017-11-20: 500 mL

## 2017-11-20 MED ORDER — HEMOSTATIC AGENTS (NO CHARGE) OPTIME
TOPICAL | Status: DC | PRN
  Administered 2017-11-20: 1 via TOPICAL

## 2017-11-20 MED ORDER — CEFAZOLIN SODIUM-DEXTROSE 2-4 GM/100ML-% IV SOLN
INTRAVENOUS | Status: AC
  Filled 2017-11-20: qty 100

## 2017-11-20 MED ORDER — CHLORHEXIDINE GLUCONATE 4 % EX LIQD: 60.0000 mL | Freq: Once | CUTANEOUS | Status: DC

## 2017-11-20 MED ORDER — LIDOCAINE HCL (CARDIAC) PF 100 MG/5ML IV SOSY
PREFILLED_SYRINGE | INTRAVENOUS | Status: DC | PRN
  Administered 2017-11-20: 80 mg via INTRAVENOUS

## 2017-11-20 MED ORDER — EPHEDRINE SULFATE-NACL 50-0.9 MG/10ML-% IV SOSY
PREFILLED_SYRINGE | INTRAVENOUS | Status: DC | PRN
  Administered 2017-11-20 (×3): 10 mg via INTRAVENOUS

## 2017-11-20 MED ORDER — ONDANSETRON HCL 4 MG/2ML IJ SOLN
INTRAMUSCULAR | Status: DC | PRN
  Administered 2017-11-20: 4 mg via INTRAVENOUS

## 2017-11-20 MED ORDER — ONDANSETRON HCL 4 MG/2ML IJ SOLN
INTRAMUSCULAR | Status: AC
  Filled 2017-11-20: qty 2

## 2017-11-20 MED ORDER — GLYCOPYRROLATE 0.2 MG/ML IJ SOLN
INTRAMUSCULAR | Status: DC | PRN
  Administered 2017-11-20: .1 mg via INTRAVENOUS

## 2017-11-20 MED ORDER — CEFAZOLIN SODIUM-DEXTROSE 2-4 GM/100ML-% IV SOLN: 2.0000 g | INTRAVENOUS | Status: DC

## 2017-11-20 MED ORDER — FENTANYL CITRATE (PF) 100 MCG/2ML IJ SOLN
INTRAMUSCULAR | Status: DC | PRN
  Administered 2017-11-20 (×2): 50 ug via INTRAVENOUS

## 2017-11-20 MED ORDER — FENTANYL CITRATE (PF) 250 MCG/5ML IJ SOLN
INTRAMUSCULAR | Status: AC
  Filled 2017-11-20: qty 5

## 2017-11-20 MED ORDER — PHENYLEPHRINE HCL 10 MG/ML IJ SOLN
INTRAMUSCULAR | Status: DC | PRN
  Administered 2017-11-20: 120 ug via INTRAVENOUS

## 2017-11-20 MED ORDER — MIDAZOLAM HCL 2 MG/2ML IJ SOLN
INTRAMUSCULAR | Status: AC
  Filled 2017-11-20: qty 2

## 2017-11-20 MED ORDER — SUCCINYLCHOLINE CHLORIDE 200 MG/10ML IV SOSY
PREFILLED_SYRINGE | INTRAVENOUS | Status: AC
  Filled 2017-11-20: qty 10

## 2017-11-20 MED ORDER — 0.9 % SODIUM CHLORIDE (POUR BTL) OPTIME
TOPICAL | Status: DC | PRN
  Administered 2017-11-20: 1000 mL

## 2017-11-20 MED ORDER — SODIUM CHLORIDE 0.9 % IV SOLN
INTRAVENOUS | Status: DC
  Administered 2017-11-20: 14:00:00 via INTRAVENOUS
  Administered 2017-11-20: 25 mL/h via INTRAVENOUS

## 2017-11-20 MED ORDER — OXYCODONE HCL 5 MG PO TABS
ORAL_TABLET | ORAL | Status: AC
  Filled 2017-11-20: qty 1

## 2017-11-20 MED ORDER — MIDAZOLAM HCL 5 MG/5ML IJ SOLN
INTRAMUSCULAR | Status: DC | PRN
  Administered 2017-11-20: 2 mg via INTRAVENOUS

## 2017-11-20 MED ORDER — LIDOCAINE-EPINEPHRINE (PF) 1 %-1:200000 IJ SOLN
INTRAMUSCULAR | Status: AC
  Filled 2017-11-20: qty 30

## 2017-11-20 MED ORDER — SODIUM CHLORIDE 0.9 % IV SOLN
INTRAVENOUS | Status: DC | PRN
  Administered 2017-11-20: 40 ug/min via INTRAVENOUS

## 2017-11-20 MED ORDER — OXYCODONE HCL 5 MG PO TABS
5.0000 mg | ORAL_TABLET | Freq: Once | ORAL | Status: AC | PRN
  Administered 2017-11-20: 5 mg via ORAL

## 2017-11-20 MED ORDER — PROPOFOL 10 MG/ML IV BOLUS
INTRAVENOUS | Status: AC
  Filled 2017-11-20: qty 20

## 2017-11-20 MED ORDER — DEXAMETHASONE SODIUM PHOSPHATE 10 MG/ML IJ SOLN
INTRAMUSCULAR | Status: DC | PRN
  Administered 2017-11-20: 4 mg via INTRAVENOUS

## 2017-11-20 MED ORDER — PROPOFOL 10 MG/ML IV BOLUS
INTRAVENOUS | Status: DC | PRN
  Administered 2017-11-20: 40 mg via INTRAVENOUS
  Administered 2017-11-20: 100 mg via INTRAVENOUS

## 2017-11-20 MED ORDER — ONDANSETRON HCL 4 MG/2ML IJ SOLN: 4.0000 mg | Freq: Once | INTRAMUSCULAR | Status: DC | PRN

## 2017-11-20 MED ORDER — FENTANYL CITRATE (PF) 100 MCG/2ML IJ SOLN
25.0000 ug | INTRAMUSCULAR | Status: DC | PRN
Start: 2017-11-20 — End: 2017-11-20

## 2017-11-20 MED ORDER — PHENYLEPHRINE 40 MCG/ML (10ML) SYRINGE FOR IV PUSH (FOR BLOOD PRESSURE SUPPORT)
PREFILLED_SYRINGE | INTRAVENOUS | Status: AC
  Filled 2017-11-20: qty 30

## 2017-11-20 MED ORDER — SODIUM CHLORIDE 0.9 % IV SOLN
INTRAVENOUS | Status: AC
  Filled 2017-11-20: qty 1.2

## 2017-11-20 MED ORDER — EPHEDRINE 5 MG/ML INJ
INTRAVENOUS | Status: AC
  Filled 2017-11-20: qty 10

## 2017-11-20 MED ORDER — OXYCODONE-ACETAMINOPHEN 10-325 MG PO TABS: 1.0000 | ORAL_TABLET | Freq: Four times a day (QID) | ORAL | 0 refills | Status: DC | PRN

## 2017-11-20 SURGICAL SUPPLY — 37 items
ADH SKN CLS APL DERMABOND .7 (GAUZE/BANDAGES/DRESSINGS) ×1
ADH SKN CLS LQ APL DERMABOND (GAUZE/BANDAGES/DRESSINGS) ×1
ARMBAND PINK RESTRICT EXTREMIT (MISCELLANEOUS) ×3 IMPLANT
CANISTER SUCT 3000ML PPV (MISCELLANEOUS) ×3 IMPLANT
CLIP VESOCCLUDE MED 24/CT (CLIP) ×2 IMPLANT
CLIP VESOCCLUDE MED 6/CT (CLIP) IMPLANT
CLIP VESOCCLUDE SM WIDE 24/CT (CLIP) ×2 IMPLANT
CLIP VESOCCLUDE SM WIDE 6/CT (CLIP) IMPLANT
COVER PROBE W GEL 5X96 (DRAPES) ×3 IMPLANT
DERMABOND ADHESIVE PROPEN (GAUZE/BANDAGES/DRESSINGS) ×2
DERMABOND ADVANCED (GAUZE/BANDAGES/DRESSINGS) ×2
DERMABOND ADVANCED .7 DNX12 (GAUZE/BANDAGES/DRESSINGS) ×1 IMPLANT
DERMABOND ADVANCED .7 DNX6 (GAUZE/BANDAGES/DRESSINGS) IMPLANT
ELECT REM PT RETURN 9FT ADLT (ELECTROSURGICAL) ×3
ELECTRODE REM PT RTRN 9FT ADLT (ELECTROSURGICAL) ×1 IMPLANT
GLOVE BIOGEL PI IND STRL 7.5 (GLOVE) ×1 IMPLANT
GLOVE BIOGEL PI INDICATOR 7.5 (GLOVE) ×2
GLOVE SURG SS PI 7.5 STRL IVOR (GLOVE) ×3 IMPLANT
GOWN STRL REUS W/ TWL LRG LVL3 (GOWN DISPOSABLE) ×2 IMPLANT
GOWN STRL REUS W/ TWL XL LVL3 (GOWN DISPOSABLE) ×1 IMPLANT
GOWN STRL REUS W/TWL LRG LVL3 (GOWN DISPOSABLE) ×6
GOWN STRL REUS W/TWL XL LVL3 (GOWN DISPOSABLE) ×3
HEMOSTAT SNOW SURGICEL 2X4 (HEMOSTASIS) ×2 IMPLANT
KIT BASIN OR (CUSTOM PROCEDURE TRAY) ×3 IMPLANT
KIT TURNOVER KIT B (KITS) ×3 IMPLANT
NS IRRIG 1000ML POUR BTL (IV SOLUTION) ×3 IMPLANT
PACK CV ACCESS (CUSTOM PROCEDURE TRAY) ×3 IMPLANT
PAD ARMBOARD 7.5X6 YLW CONV (MISCELLANEOUS) ×6 IMPLANT
SUT PROLENE 5 0 C 1 24 (SUTURE) ×2 IMPLANT
SUT PROLENE 6 0 CC (SUTURE) ×3 IMPLANT
SUT SILK 2 0 SH (SUTURE) ×2 IMPLANT
SUT VIC AB 3-0 SH 27 (SUTURE) ×6
SUT VIC AB 3-0 SH 27X BRD (SUTURE) ×1 IMPLANT
SUT VICRYL 4-0 PS2 18IN ABS (SUTURE) ×5 IMPLANT
TOWEL GREEN STERILE (TOWEL DISPOSABLE) ×3 IMPLANT
UNDERPAD 30X30 (UNDERPADS AND DIAPERS) ×3 IMPLANT
WATER STERILE IRR 1000ML POUR (IV SOLUTION) ×3 IMPLANT

## 2017-11-20 NOTE — Transfer of Care (Signed)
Immediate Anesthesia Transfer of Care Note  Patient: Aaron [name]  Procedure(s) Performed: SECOND STAGE BASILIC VEIN TRANSPOSITION LEFT ARM (Left Arm Upper)  Patient Location: PACU  Anesthesia Type:General  Level of Consciousness: awake, alert , oriented and patient cooperative  Airway & Oxygen Therapy: Patient Spontanous Breathing and Patient connected to nasal cannula oxygen  Post-op Assessment: Report given to RN, Post -op Vital signs reviewed and stable and Patient moving all extremities X 4  Post vital signs: Reviewed and stable  Last Vitals:  Vitals Value Taken Time  BP 144/89 11/20/2017  2:35 PM  Temp    Pulse 77 11/20/2017  2:37 PM  Resp 18 11/20/2017  2:37 PM  SpO2 98 % 11/20/2017  2:37 PM  Vitals shown include unvalidated device data.  Last Pain:  Vitals:   11/20/17 0920  TempSrc:   PainSc: 0-No pain         Complications: No apparent anesthesia complications

## 2017-11-20 NOTE — Anesthesia Preprocedure Evaluation (Addendum)
Anesthesia Evaluation  Patient identified by MRN, date of birth, ID band Patient awake    Reviewed: Allergy & Precautions, NPO status , Patient's Chart, lab work & pertinent test results  History of Anesthesia Complications Negative for: history of anesthetic complications  Airway Mallampati: I  TM Distance: >3 FB Neck ROM: Full    Dental  (+) Edentulous Upper, Edentulous Lower   Pulmonary COPD,  COPD inhaler, former smoker,    breath sounds clear to auscultation       Cardiovascular hypertension, Pt. on home beta blockers and Pt. on medications + CAD, + Past MI and + Peripheral Vascular Disease  + dysrhythmias Atrial Fibrillation and Supra Ventricular Tachycardia  Rhythm:Regular Rate:Normal   '19 TTE - Moderate concentric LVH. EF 55% to 60%. Mild TR.  '19 Carotid US - 40-59% Left ICAS, 1-39% Right ICAS  EKG - Afib 112 bpm, septal Q waves    Neuro/Psych PSYCHIATRIC DISORDERS Anxiety Depression negative neurological ROS     GI/Hepatic GERD  Medicated and Controlled,(+) Hepatitis -, C Schatzki's ring    Endo/Other  negative endocrine ROS  Renal/GU ESRF and DialysisRenal disease Polycystic kidney   negative genitourinary   Musculoskeletal  (+) Arthritis ,  Chronic back pain    Abdominal   Peds  Hematology  (+) anemia ,   Anesthesia Other Findings   Reproductive/Obstetrics                            Anesthesia Physical Anesthesia Plan  ASA: III  Anesthesia Plan: General   Post-op Pain Management:    Induction: Intravenous  PONV Risk Score and Plan: 2 and Treatment may vary due to age or medical condition, Ondansetron and Dexamethasone  Airway Management Planned: LMA  Additional Equipment: None  Intra-op Plan:   Post-operative Plan: Extubation in OR  Informed Consent: I have reviewed the patients History and Physical, chart, labs and discussed the procedure including  the risks, benefits and alternatives for the proposed anesthesia with the patient or authorized representative who has indicated his/her understanding and acceptance.     Plan Discussed with: CRNA, Anesthesiologist and Surgeon  Anesthesia Plan Comments: (Per surgeon, procedure believed to be too stimulating to proceed under MAC. Will place LMA at surgeon request)      Anesthesia Quick Evaluation

## 2017-11-20 NOTE — Anesthesia Postprocedure Evaluation (Signed)
Anesthesia Post Note  Patient: [name]  Procedure(s) Performed: SECOND STAGE BASILIC VEIN TRANSPOSITION LEFT ARM (Left Arm Upper)     Patient location during evaluation: PACU Anesthesia Type: General Level of consciousness: awake and alert Pain management: pain level controlled Vital Signs Assessment: post-procedure vital signs reviewed and stable Respiratory status: spontaneous breathing, nonlabored ventilation and respiratory function stable Cardiovascular status: blood pressure returned to baseline and stable Postop Assessment: no apparent nausea or vomiting Anesthetic complications: no    Last Vitals:  Vitals:   11/20/17 1450 11/20/17 1520  BP: (!) 149/91 (!) 148/90  Pulse: 75 70  Resp: 19 17  Temp: (!) 36.2 C   SpO2: 97% 98%    Last Pain:  Vitals:   11/20/17 1520  TempSrc:   PainSc: [place]

## 2017-11-20 NOTE — Telephone Encounter (Signed)
Sched. Appt. Spoke with pt. Mailed letter VWB 2wks p/o PA s/p L 2nd stg BVT

## 2017-11-20 NOTE — Op Note (Signed)
    Patient name: Aaron [name] MRN: [id number] DOB: 1955-12-30 Sex: male  11/20/2017 Pre-operative Diagnosis: ESRD Post-operative diagnosis:  Same Surgeon:  [name] Assistants: Aldona Bar Ryne Procedure:   Second stage left basilic vein fistula Anesthesia: MAC Blood Loss:  50cc Specimens: None  Findings: Excellent 6 mm basilic vein  Indications: The patient has previously had creation of a basilic vein fistula.  He is here today for elevation.  Procedure:  The patient was identified in the holding area and taken to [facility]  The patient was then placed supine on the table. general anesthesia was administered.  The patient was prepped and draped in the usual sterile fashion.  A time out was called and antibiotics were administered.  Ultrasound was used to map the course of the basilic vein in the upper arm.  This was an excellent caliber vein.  I made 2 longitudinal incisions on the medial aspect of the left upper arm.  Through these incisions I dissected out circumferentially the basilic vein, taking care to protect the nerve.  Once the vein was fully mobilized, all side branches were ligated between silk ties.  The vein was marked for orientation.  I then used a curved tunneler to create a subcutaneous tunnel.  The vein was then transected near the antecubital crease.  It was then brought to the previously created tunnel making sure to maintain proper orientation.  A primary anastomosis was then performed between the 2 cut ends of the vein with a running 5-0 Prolene.  Once this was done the clamps were released.  There was excellent flow through the fistula.  Hemostasis was then achieved.  The wound was irrigated.  The incision was closed with a deep layer of 3-0 Vicryl followed by a subcutaneous 4-0 Vicryl and Dermabond.  There were no immediate complications.   Disposition: To PACU stable   V. [name], M.D. [facility] [place] Office:  [phone] Pager:  [phone]

## 2017-11-20 NOTE — Discharge Instructions (Signed)
[facility]  Discharge Instructions  AV Fistula or Graft Surgery for Dialysis Access  Please refer to the following instructions for your post-procedure care. Your surgeon or physician assistant will discuss any changes with you.  Activity  You may drive the day following your surgery, if you are comfortable and no longer taking prescription pain medication. Resume full activity as the soreness in your incision resolves.  Bathing/Showering  You may shower after you go home. Keep your incision dry for 48 hours. Do not soak in a bathtub, hot tub, or swim until the incision heals completely. You may not shower if you have a hemodialysis catheter.  Incision Care  Clean your incision with mild soap and water after 48 hours. Pat the area dry with a clean towel. You do not need a bandage unless otherwise instructed. Do not apply any ointments or creams to your incision. You may have skin glue on your incision. Do not peel it off. It will come off on its own in about one week. Your arm may swell a bit after surgery. To reduce swelling use pillows to elevate your arm so it is above your heart. Your doctor will tell you if you need to lightly wrap your arm with an ACE bandage.  Diet  Resume your normal diet. There are not special food restrictions following this procedure. In order to heal from your surgery, it is CRITICAL to get adequate nutrition. Your body requires vitamins, minerals, and protein. Vegetables are the best source of vitamins and minerals. Vegetables also provide the perfect balance of protein. Processed food has little nutritional value, so try to avoid this.  Medications  Resume taking all of your medications. If your incision is causing pain, you may take over-the counter pain relievers such as acetaminophen (Tylenol). If you were prescribed a stronger pain medication, please be aware these medications can cause nausea and constipation. Prevent  nausea by taking the medication with a snack or meal. Avoid constipation by drinking plenty of fluids and eating foods with high amount of fiber, such as fruits, vegetables, and grains.  Do not take Tylenol if you are taking prescription pain medications.  Follow up Your surgeon may want to see you in the office following your access surgery. If so, this will be arranged at the time of your surgery.  Please call us immediately for any of the following conditions:  Increased pain, redness, drainage (pus) from your incision site Fever of 101 degrees or higher Severe or worsening pain at your incision site Hand pain or numbness.  Reduce your risk of vascular disease:  Stop smoking. If you would like help, call [facility] at 1-800-QUIT-NOW 6623619648) or [place] at [place] your cholesterol Maintain a desired weight Control your diabetes Keep your blood pressure down  Dialysis  It will take several weeks to several months for your new dialysis access to be ready for use. Your surgeon will determine when it is okay to use it. Your nephrologist will continue to direct your dialysis. You can continue to use your Permcath until your new access is ready for use.   11/20/2017 OVADIA LOPP [id number] [birth year]  Surgeon(s): Serafina Mitchell, MD  Procedure(s): SECOND STAGE BASILIC VEIN TRANSPOSITION LEFT ARM  x Do not stick fistula for 4 weeks    If you have any questions, please call the office at [phone].   Post Anesthesia Home Care Instructions  Activity: Get plenty of rest for the  remainder of the day. A responsible individual must stay with you for 24 hours following the procedure.  For the next 24 hours, DO NOT: -Drive a car -Paediatric nurse -Drink alcoholic beverages -Take any medication unless instructed by your physician -Make any legal decisions or sign important papers.  Meals: Start with liquid foods such as gelatin or soup. Progress  to regular foods as tolerated. Avoid greasy, spicy, heavy foods. If nausea and/or vomiting occur, drink only clear liquids until the nausea and/or vomiting subsides. Call your physician if vomiting continues.  Special Instructions/Symptoms: Your throat may feel dry or sore from the anesthesia or the breathing tube placed in your throat during surgery. If this causes discomfort, gargle with warm salt water. The discomfort should disappear within 24 hours.  If you had a scopolamine patch placed behind your ear for the management of post- operative nausea and/or vomiting:  1. The medication in the patch is effective for 72 hours, after which it should be removed.  Wrap patch in a tissue and discard in the trash. Wash hands thoroughly with soap and water. 2. You may remove the patch earlier than 72 hours if you experience unpleasant side effects which may include dry mouth, dizziness or visual disturbances. 3. Avoid touching the patch. Wash your hands with soap and water after contact with the patch.

## 2017-11-20 NOTE — Interval H&P Note (Signed)
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 After consideration of risks, benefits and other options for treatment, the patient has consented to  Procedure(s): SECOND STAGE BASILIC VEIN TRANSPOSITION LEFT ARM (Left) as a surgical intervention .  The patient's history has been reviewed, patient examined, no change in status, stable for surgery.  I have reviewed the patient's chart and labs.  Questions were answered to the patient's satisfaction.     [name]

## 2017-11-20 NOTE — Anesthesia Procedure Notes (Signed)
Procedure Name: LMA Insertion Date/Time: 11/20/2017 1:07 PM Performed by: Carney Living, CRNA Pre-anesthesia Checklist: Patient identified, Emergency Drugs available, Suction available, Patient being monitored and Timeout performed Patient Re-evaluated:Patient Re-evaluated prior to induction Oxygen Delivery Method: Circle system utilized Preoxygenation: Pre-oxygenation with 100% oxygen Induction Type: IV induction LMA: LMA inserted LMA Size: 5.0 Number of attempts: 2 Placement Confirmation: ETT inserted through vocal cords under direct vision,  positive ETCO2 and breath sounds checked- equal and bilateral Tube secured with: Tape Dental Injury: Teeth and Oropharynx as per pre-operative assessment  Comments: LMA inserted by Dorina Hoyer, CRNA, Pt coughing, inadequate TV, LMA removed, Pt ventilated via mask, LMA placed By Dr Fransisco Beau, + ETCO2, VSS       d

## 2017-11-21 ENCOUNTER — Encounter (HOSPITAL_COMMUNITY): Payer: Self-pay | Admitting: Surgery

## 2017-11-23 ENCOUNTER — Ambulatory Visit (HOSPITAL_COMMUNITY): Payer: 59 | Admitting: Anesthesiology

## 2017-11-23 ENCOUNTER — Telehealth: Payer: Self-pay | Admitting: *Deleted

## 2017-11-23 ENCOUNTER — Other Ambulatory Visit: Payer: Self-pay

## 2017-11-23 ENCOUNTER — Encounter (HOSPITAL_COMMUNITY): Payer: Self-pay | Admitting: *Deleted

## 2017-11-23 ENCOUNTER — Ambulatory Visit (INDEPENDENT_AMBULATORY_CARE_PROVIDER_SITE_OTHER): Payer: Self-pay | Admitting: Physician Assistant

## 2017-11-23 ENCOUNTER — Observation Stay (HOSPITAL_COMMUNITY)
Admission: AD | Admit: 2017-11-23 | Discharge: 2017-11-24 | Disposition: A | Discharge status: Home / Self Care | Payer: 59 | Source: Ambulatory Visit | Attending: Vascular Surgery | Admitting: Vascular Surgery

## 2017-11-23 ENCOUNTER — Encounter (HOSPITAL_COMMUNITY)
Admission: AD | Disposition: A | Discharge status: Home / Self Care | Payer: Self-pay | Source: Ambulatory Visit | Attending: Vascular Surgery

## 2017-11-23 VITALS — BP 186/94 | HR 66 | Temp 98.1°F | Resp 18 | Ht 75.0 in | Wt 181.0 lb

## 2017-11-23 DIAGNOSIS — Z992 Dependence on renal dialysis: Secondary | ICD-10-CM | POA: Diagnosis not present

## 2017-11-23 DIAGNOSIS — I97418 Intraoperative hemorrhage and hematoma of a circulatory system organ or structure complicating other circulatory system procedure: Secondary | ICD-10-CM

## 2017-11-23 DIAGNOSIS — M4690 Unspecified inflammatory spondylopathy, site unspecified: Secondary | ICD-10-CM | POA: Insufficient documentation

## 2017-11-23 DIAGNOSIS — D631 Anemia in chronic kidney disease: Secondary | ICD-10-CM | POA: Diagnosis not present

## 2017-11-23 DIAGNOSIS — I739 Peripheral vascular disease, unspecified: Secondary | ICD-10-CM | POA: Diagnosis not present

## 2017-11-23 DIAGNOSIS — G8929 Other chronic pain: Secondary | ICD-10-CM | POA: Insufficient documentation

## 2017-11-23 DIAGNOSIS — M19012 Primary osteoarthritis, left shoulder: Secondary | ICD-10-CM | POA: Diagnosis not present

## 2017-11-23 DIAGNOSIS — I120 Hypertensive chronic kidney disease with stage 5 chronic kidney disease or end stage renal disease: Secondary | ICD-10-CM | POA: Diagnosis not present

## 2017-11-23 DIAGNOSIS — Q613 Polycystic kidney, unspecified: Secondary | ICD-10-CM | POA: Insufficient documentation

## 2017-11-23 DIAGNOSIS — E785 Hyperlipidemia, unspecified: Secondary | ICD-10-CM | POA: Insufficient documentation

## 2017-11-23 DIAGNOSIS — Z95820 Peripheral vascular angioplasty status with implants and grafts: Secondary | ICD-10-CM | POA: Insufficient documentation

## 2017-11-23 DIAGNOSIS — I255 Ischemic cardiomyopathy: Secondary | ICD-10-CM | POA: Diagnosis not present

## 2017-11-23 DIAGNOSIS — K625 Hemorrhage of anus and rectum: Secondary | ICD-10-CM | POA: Insufficient documentation

## 2017-11-23 DIAGNOSIS — D509 Iron deficiency anemia, unspecified: Secondary | ICD-10-CM | POA: Diagnosis not present

## 2017-11-23 DIAGNOSIS — I480 Paroxysmal atrial fibrillation: Secondary | ICD-10-CM | POA: Diagnosis not present

## 2017-11-23 DIAGNOSIS — E43 Unspecified severe protein-calorie malnutrition: Secondary | ICD-10-CM | POA: Diagnosis not present

## 2017-11-23 DIAGNOSIS — Z79899 Other long term (current) drug therapy: Secondary | ICD-10-CM | POA: Insufficient documentation

## 2017-11-23 DIAGNOSIS — Z8619 Personal history of other infectious and parasitic diseases: Secondary | ICD-10-CM | POA: Diagnosis not present

## 2017-11-23 DIAGNOSIS — K222 Esophageal obstruction: Secondary | ICD-10-CM | POA: Diagnosis not present

## 2017-11-23 DIAGNOSIS — F419 Anxiety disorder, unspecified: Secondary | ICD-10-CM | POA: Insufficient documentation

## 2017-11-23 DIAGNOSIS — Z87891 Personal history of nicotine dependence: Secondary | ICD-10-CM | POA: Insufficient documentation

## 2017-11-23 DIAGNOSIS — J449 Chronic obstructive pulmonary disease, unspecified: Secondary | ICD-10-CM | POA: Diagnosis not present

## 2017-11-23 DIAGNOSIS — N186 End stage renal disease: Secondary | ICD-10-CM | POA: Diagnosis not present

## 2017-11-23 DIAGNOSIS — K219 Gastro-esophageal reflux disease without esophagitis: Secondary | ICD-10-CM | POA: Insufficient documentation

## 2017-11-23 DIAGNOSIS — I252 Old myocardial infarction: Secondary | ICD-10-CM | POA: Insufficient documentation

## 2017-11-23 DIAGNOSIS — K2980 Duodenitis without bleeding: Secondary | ICD-10-CM | POA: Insufficient documentation

## 2017-11-23 DIAGNOSIS — Y838 Other surgical procedures as the cause of abnormal reaction of the patient, or of later complication, without mention of misadventure at the time of the procedure: Secondary | ICD-10-CM | POA: Diagnosis not present

## 2017-11-23 DIAGNOSIS — N179 Acute kidney failure, unspecified: Secondary | ICD-10-CM | POA: Diagnosis not present

## 2017-11-23 DIAGNOSIS — L7632 Postprocedural hematoma of skin and subcutaneous tissue following other procedure: Secondary | ICD-10-CM | POA: Diagnosis not present

## 2017-11-23 DIAGNOSIS — Z87442 Personal history of urinary calculi: Secondary | ICD-10-CM | POA: Insufficient documentation

## 2017-11-23 DIAGNOSIS — F329 Major depressive disorder, single episode, unspecified: Secondary | ICD-10-CM | POA: Insufficient documentation

## 2017-11-23 DIAGNOSIS — S40021A Contusion of right upper arm, initial encounter: Secondary | ICD-10-CM | POA: Diagnosis present

## 2017-11-23 DIAGNOSIS — I2510 Atherosclerotic heart disease of native coronary artery without angina pectoris: Secondary | ICD-10-CM | POA: Insufficient documentation

## 2017-11-23 DIAGNOSIS — M170 Bilateral primary osteoarthritis of knee: Secondary | ICD-10-CM | POA: Insufficient documentation

## 2017-11-23 DIAGNOSIS — Z7901 Long term (current) use of anticoagulants: Secondary | ICD-10-CM | POA: Insufficient documentation

## 2017-11-23 DIAGNOSIS — M19011 Primary osteoarthritis, right shoulder: Secondary | ICD-10-CM | POA: Insufficient documentation

## 2017-11-23 DIAGNOSIS — F341 Dysthymic disorder: Secondary | ICD-10-CM | POA: Insufficient documentation

## 2017-11-23 DIAGNOSIS — S301XXA Contusion of abdominal wall, initial encounter: Secondary | ICD-10-CM | POA: Insufficient documentation

## 2017-11-23 HISTORY — PX: I & D EXTREMITY: SHX5045

## 2017-11-23 LAB — POCT I-STAT 4, (NA,K, GLUC, HGB,HCT)
GLUCOSE: 94 mg/dL (ref 70–99)
HCT: 35 % — ABNORMAL LOW (ref 39.0–52.0)
Hemoglobin: 11.9 g/dL — ABNORMAL LOW (ref 13.0–17.0)
POTASSIUM: 3.6 mmol/L (ref 3.5–5.1)
SODIUM: 135 mmol/L (ref 135–145)

## 2017-11-23 LAB — PROTIME-INR
INR: 0.92
PROTHROMBIN TIME: 12.3 s (ref 11.4–15.2)

## 2017-11-23 SURGERY — IRRIGATION AND DEBRIDEMENT EXTREMITY
Anesthesia: General | Site: Arm Upper | Laterality: Left

## 2017-11-23 MED ORDER — FENTANYL CITRATE (PF) 100 MCG/2ML IJ SOLN
INTRAMUSCULAR | Status: AC
  Filled 2017-11-23: qty 2

## 2017-11-23 MED ORDER — ROCURONIUM BROMIDE 10 MG/ML (PF) SYRINGE
PREFILLED_SYRINGE | INTRAVENOUS | Status: AC
  Filled 2017-11-23: qty 10

## 2017-11-23 MED ORDER — PANTOPRAZOLE SODIUM 40 MG PO TBEC
40.0000 mg | DELAYED_RELEASE_TABLET | Freq: Every day | ORAL | Status: DC
  Administered 2017-11-23 – 2017-11-24 (×2): 40 mg via ORAL
  Filled 2017-11-23 (×2): qty 1

## 2017-11-23 MED ORDER — 0.9 % SODIUM CHLORIDE (POUR BTL) OPTIME
TOPICAL | Status: DC | PRN
  Administered 2017-11-23: 1000 mL

## 2017-11-23 MED ORDER — SODIUM CHLORIDE 0.9% FLUSH: 3.0000 mL | INTRAVENOUS | Status: DC | PRN

## 2017-11-23 MED ORDER — SUCCINYLCHOLINE CHLORIDE 20 MG/ML IJ SOLN
INTRAMUSCULAR | Status: DC | PRN
  Administered 2017-11-23: 100 mg via INTRAVENOUS

## 2017-11-23 MED ORDER — HYDRALAZINE HCL 20 MG/ML IJ SOLN: 5.0000 mg | INTRAMUSCULAR | Status: DC | PRN

## 2017-11-23 MED ORDER — CALCIUM ACETATE (PHOS BINDER) 667 MG PO CAPS
667.0000 mg | ORAL_CAPSULE | Freq: Three times a day (TID) | ORAL | Status: DC
  Administered 2017-11-23 – 2017-11-24 (×3): 667 mg via ORAL
  Filled 2017-11-23 (×4): qty 1

## 2017-11-23 MED ORDER — CEFAZOLIN SODIUM-DEXTROSE 1-4 GM/50ML-% IV SOLN
1.0000 g | Freq: Three times a day (TID) | INTRAVENOUS | Status: AC
  Administered 2017-11-23 – 2017-11-24 (×3): 1 g via INTRAVENOUS
  Filled 2017-11-23 (×3): qty 50

## 2017-11-23 MED ORDER — SODIUM CHLORIDE 0.9 % IV SOLN
INTRAVENOUS | Status: DC
  Administered 2017-11-23 (×2): via INTRAVENOUS

## 2017-11-23 MED ORDER — ACETAMINOPHEN 325 MG PO TABS: 325.0000 mg | ORAL_TABLET | ORAL | Status: DC | PRN

## 2017-11-23 MED ORDER — NEPRO/CARBSTEADY PO LIQD: 237.0000 mL | Freq: Two times a day (BID) | ORAL | Status: DC

## 2017-11-23 MED ORDER — EPHEDRINE SULFATE 50 MG/ML IJ SOLN
INTRAMUSCULAR | Status: DC | PRN
  Administered 2017-11-23 (×4): 5 mg via INTRAVENOUS

## 2017-11-23 MED ORDER — ONDANSETRON HCL 4 MG/2ML IJ SOLN
INTRAMUSCULAR | Status: DC | PRN
  Administered 2017-11-23: 4 mg via INTRAVENOUS

## 2017-11-23 MED ORDER — SUCCINYLCHOLINE CHLORIDE 200 MG/10ML IV SOSY
PREFILLED_SYRINGE | INTRAVENOUS | Status: AC
  Filled 2017-11-23: qty 10

## 2017-11-23 MED ORDER — CARVEDILOL 12.5 MG PO TABS
ORAL_TABLET | ORAL | Status: AC
  Administered 2017-11-23: 25 mg via ORAL
  Filled 2017-11-23: qty 2

## 2017-11-23 MED ORDER — FENTANYL CITRATE (PF) 250 MCG/5ML IJ SOLN
INTRAMUSCULAR | Status: AC
  Filled 2017-11-23: qty 5

## 2017-11-23 MED ORDER — PROPOFOL 10 MG/ML IV BOLUS
INTRAVENOUS | Status: DC | PRN
  Administered 2017-11-23: 160 mg via INTRAVENOUS

## 2017-11-23 MED ORDER — DEXAMETHASONE SODIUM PHOSPHATE 10 MG/ML IJ SOLN
INTRAMUSCULAR | Status: AC
  Filled 2017-11-23: qty 1

## 2017-11-23 MED ORDER — LIDOCAINE HCL (CARDIAC) PF 100 MG/5ML IV SOSY
PREFILLED_SYRINGE | INTRAVENOUS | Status: DC | PRN
  Administered 2017-11-23: 60 mg via INTRAVENOUS

## 2017-11-23 MED ORDER — FENTANYL CITRATE (PF) 100 MCG/2ML IJ SOLN
INTRAMUSCULAR | Status: AC
  Administered 2017-11-23: 50 ug via INTRAVENOUS
  Filled 2017-11-23: qty 2

## 2017-11-23 MED ORDER — NEPRO/CARBSTEADY PO LIQD
237.0000 mL | Freq: Two times a day (BID) | ORAL | Status: DC
  Administered 2017-11-24 (×2): 237 mL via ORAL
  Filled 2017-11-23 (×2): qty 237

## 2017-11-23 MED ORDER — FENTANYL CITRATE (PF) 100 MCG/2ML IJ SOLN
25.0000 ug | INTRAMUSCULAR | Status: DC | PRN
  Administered 2017-11-23 (×3): 50 ug via INTRAVENOUS

## 2017-11-23 MED ORDER — CARVEDILOL 25 MG PO TABS
25.0000 mg | ORAL_TABLET | Freq: Two times a day (BID) | ORAL | Status: DC
  Administered 2017-11-23 – 2017-11-24 (×2): 25 mg via ORAL
  Filled 2017-11-23 (×2): qty 1

## 2017-11-23 MED ORDER — EPHEDRINE 5 MG/ML INJ
INTRAVENOUS | Status: AC
  Filled 2017-11-23: qty 10

## 2017-11-23 MED ORDER — DEXAMETHASONE SODIUM PHOSPHATE 10 MG/ML IJ SOLN
INTRAMUSCULAR | Status: DC | PRN
  Administered 2017-11-23: 10 mg via INTRAVENOUS

## 2017-11-23 MED ORDER — VITAMIN D 1000 UNITS PO TABS
2000.0000 [IU] | ORAL_TABLET | Freq: Every day | ORAL | Status: DC
  Administered 2017-11-23 – 2017-11-24 (×2): 2000 [IU] via ORAL
  Filled 2017-11-23 (×2): qty 2

## 2017-11-23 MED ORDER — CARVEDILOL 25 MG PO TABS
25.0000 mg | ORAL_TABLET | Freq: Once | ORAL | Status: AC
  Administered 2017-11-23: 25 mg via ORAL
  Filled 2017-11-23: qty 1

## 2017-11-23 MED ORDER — MORPHINE SULFATE (PF) 2 MG/ML IV SOLN
2.0000 mg | INTRAVENOUS | Status: DC | PRN
  Administered 2017-11-23 – 2017-11-24 (×3): 4 mg via INTRAVENOUS
  Filled 2017-11-23 (×3): qty 2

## 2017-11-23 MED ORDER — CEFAZOLIN SODIUM-DEXTROSE 2-4 GM/100ML-% IV SOLN
INTRAVENOUS | Status: AC
  Filled 2017-11-23: qty 100

## 2017-11-23 MED ORDER — CEFAZOLIN SODIUM-DEXTROSE 2-3 GM-%(50ML) IV SOLR
INTRAVENOUS | Status: DC | PRN
  Administered 2017-11-23: 2 g via INTRAVENOUS

## 2017-11-23 MED ORDER — FLUTICASONE FUROATE-VILANTEROL 200-25 MCG/INH IN AEPB: 1.0000 | INHALATION_SPRAY | Freq: Every day | RESPIRATORY_TRACT | Status: DC | PRN

## 2017-11-23 MED ORDER — GUAIFENESIN-DM 100-10 MG/5ML PO SYRP: 15.0000 mL | ORAL_SOLUTION | ORAL | Status: DC | PRN

## 2017-11-23 MED ORDER — PHENYLEPHRINE 40 MCG/ML (10ML) SYRINGE FOR IV PUSH (FOR BLOOD PRESSURE SUPPORT)
PREFILLED_SYRINGE | INTRAVENOUS | Status: AC
  Filled 2017-11-23: qty 10

## 2017-11-23 MED ORDER — SODIUM CHLORIDE 0.9 % IV SOLN
INTRAVENOUS | Status: AC
  Filled 2017-11-23: qty 1.2

## 2017-11-23 MED ORDER — ACETAMINOPHEN 325 MG RE SUPP
325.0000 mg | RECTAL | Status: DC | PRN
  Filled 2017-11-23: qty 2

## 2017-11-23 MED ORDER — SODIUM CHLORIDE 0.9% FLUSH
3.0000 mL | Freq: Two times a day (BID) | INTRAVENOUS | Status: DC
  Administered 2017-11-24: 3 mL via INTRAVENOUS

## 2017-11-23 MED ORDER — MIDAZOLAM HCL 5 MG/5ML IJ SOLN
INTRAMUSCULAR | Status: DC | PRN
  Administered 2017-11-23: 1 mg via INTRAVENOUS

## 2017-11-23 MED ORDER — PROPOFOL 10 MG/ML IV BOLUS
INTRAVENOUS | Status: AC
  Filled 2017-11-23: qty 20

## 2017-11-23 MED ORDER — LIDOCAINE HCL (PF) 1 % IJ SOLN
INTRAMUSCULAR | Status: AC
  Filled 2017-11-23: qty 30

## 2017-11-23 MED ORDER — METOPROLOL TARTRATE 5 MG/5ML IV SOLN: 2.0000 mg | INTRAVENOUS | Status: DC | PRN

## 2017-11-23 MED ORDER — LABETALOL HCL 5 MG/ML IV SOLN: 10.0000 mg | INTRAVENOUS | Status: DC | PRN

## 2017-11-23 MED ORDER — SODIUM CHLORIDE 0.9 % IV SOLN: 250.0000 mL | INTRAVENOUS | Status: DC | PRN

## 2017-11-23 MED ORDER — PROMETHAZINE HCL 25 MG/ML IJ SOLN: 6.2500 mg | INTRAMUSCULAR | Status: DC | PRN

## 2017-11-23 MED ORDER — MIDAZOLAM HCL 2 MG/2ML IJ SOLN
INTRAMUSCULAR | Status: AC
  Filled 2017-11-23: qty 2

## 2017-11-23 MED ORDER — GLYCOPYRROLATE 0.2 MG/ML IJ SOLN
INTRAMUSCULAR | Status: DC | PRN
Start: 2017-11-23 — End: 2017-11-23
  Administered 2017-11-23: 0.2 mg via INTRAVENOUS

## 2017-11-23 MED ORDER — FENTANYL CITRATE (PF) 100 MCG/2ML IJ SOLN
INTRAMUSCULAR | Status: DC | PRN
  Administered 2017-11-23 (×3): 50 ug via INTRAVENOUS

## 2017-11-23 MED ORDER — PHENYLEPHRINE HCL 10 MG/ML IJ SOLN
INTRAMUSCULAR | Status: DC | PRN
  Administered 2017-11-23: 160 ug via INTRAVENOUS
  Administered 2017-11-23: 120 ug via INTRAVENOUS
  Administered 2017-11-23: 80 ug via INTRAVENOUS

## 2017-11-23 MED ORDER — DOCUSATE SODIUM 100 MG PO CAPS
100.0000 mg | ORAL_CAPSULE | Freq: Two times a day (BID) | ORAL | Status: DC
  Administered 2017-11-23 – 2017-11-24 (×2): 100 mg via ORAL
  Filled 2017-11-23 (×2): qty 1

## 2017-11-23 MED ORDER — SODIUM CHLORIDE 0.9 % IV SOLN
INTRAVENOUS | Status: DC | PRN
  Administered 2017-11-23: 75 ug/min via INTRAVENOUS

## 2017-11-23 MED ORDER — ONDANSETRON HCL 4 MG/2ML IJ SOLN
INTRAMUSCULAR | Status: AC
  Filled 2017-11-23: qty 2

## 2017-11-23 MED ORDER — RENA-VITE PO TABS
1.0000 | ORAL_TABLET | Freq: Every day | ORAL | Status: DC
  Administered 2017-11-23 – 2017-11-24 (×2): 1 via ORAL
  Filled 2017-11-23 (×2): qty 1

## 2017-11-23 MED ORDER — ONDANSETRON HCL 4 MG/2ML IJ SOLN
4.0000 mg | Freq: Four times a day (QID) | INTRAMUSCULAR | Status: DC | PRN
  Administered 2017-11-24: 4 mg via INTRAVENOUS
  Filled 2017-11-23: qty 2

## 2017-11-23 MED ORDER — OXYCODONE HCL 5 MG PO TABS
5.0000 mg | ORAL_TABLET | ORAL | Status: DC | PRN
  Administered 2017-11-24 (×2): 10 mg via ORAL
  Filled 2017-11-23 (×2): qty 2

## 2017-11-23 MED ORDER — PHENOL 1.4 % MT LIQD: 1.0000 | OROMUCOSAL | Status: DC | PRN

## 2017-11-23 SURGICAL SUPPLY — 33 items
BANDAGE ACE 4X5 VEL STRL LF (GAUZE/BANDAGES/DRESSINGS) IMPLANT
BANDAGE ACE 6X5 VEL STRL LF (GAUZE/BANDAGES/DRESSINGS) IMPLANT
BNDG GAUZE ELAST 4 BULKY (GAUZE/BANDAGES/DRESSINGS) ×1 IMPLANT
CANISTER SUCT 3000ML PPV (MISCELLANEOUS) ×2 IMPLANT
CANNULA VESSEL 3MM 2 BLNT TIP (CANNULA) ×1 IMPLANT
CLIP VESOCCLUDE MED 6/CT (CLIP) ×1 IMPLANT
CLIP VESOCCLUDE SM WIDE 6/CT (CLIP) ×1 IMPLANT
COVER SURGICAL LIGHT HANDLE (MISCELLANEOUS) ×2 IMPLANT
DRAIN JACKSON PRATT 10MM FLAT (MISCELLANEOUS) ×1 IMPLANT
DRSG EMULSION OIL 3X3 NADH (GAUZE/BANDAGES/DRESSINGS) ×1 IMPLANT
ELECT REM PT RETURN 9FT ADLT (ELECTROSURGICAL) ×2
ELECTRODE REM PT RTRN 9FT ADLT (ELECTROSURGICAL) ×1 IMPLANT
EVACUATOR SILICONE 100CC (DRAIN) ×1 IMPLANT
GAUZE SPONGE 4X4 12PLY STRL (GAUZE/BANDAGES/DRESSINGS) ×2 IMPLANT
GAUZE SPONGE 4X4 12PLY STRL LF (GAUZE/BANDAGES/DRESSINGS) ×1 IMPLANT
GLOVE BIO SURGEON STRL SZ7.5 (GLOVE) ×2 IMPLANT
GOWN STRL REUS W/ TWL LRG LVL3 (GOWN DISPOSABLE) ×3 IMPLANT
GOWN STRL REUS W/TWL LRG LVL3 (GOWN DISPOSABLE) ×6
KIT BASIN OR (CUSTOM PROCEDURE TRAY) ×2 IMPLANT
KIT TURNOVER KIT B (KITS) ×2 IMPLANT
NS IRRIG 1000ML POUR BTL (IV SOLUTION) ×2 IMPLANT
PACK GENERAL/GYN (CUSTOM PROCEDURE TRAY) IMPLANT
PACK UNIVERSAL I (CUSTOM PROCEDURE TRAY) IMPLANT
PAD ARMBOARD 7.5X6 YLW CONV (MISCELLANEOUS) ×4 IMPLANT
SPONGE LAP 18X18 X RAY DECT (DISPOSABLE) ×1 IMPLANT
STAPLER VISISTAT 35W (STAPLE) IMPLANT
SUT ETHILON 3 0 PS 1 (SUTURE) ×3 IMPLANT
SUT VIC AB 2-0 CTX 36 (SUTURE) IMPLANT
SUT VIC AB 3-0 SH 27 (SUTURE) ×4
SUT VIC AB 3-0 SH 27X BRD (SUTURE) IMPLANT
SUT VICRYL 4-0 PS2 18IN ABS (SUTURE) IMPLANT
TOWEL GREEN STERILE (TOWEL DISPOSABLE) ×2 IMPLANT
WATER STERILE IRR 1000ML POUR (IV SOLUTION) ×2 IMPLANT

## 2017-11-23 NOTE — Progress Notes (Signed)
POST OPERATIVE OFFICE NOTE    CC:  F/u for surgery  HPI:  This is a 62 y.o. male who is s/p 2nd stage left basilic vein transposition on 11/20/17 by Dr. Trula Slade.  The pt states that his incision split open and has been bleeding.  He says that he went to bed on Friday evening and woke up to his shirt and pillows having blood on them.  He took his coumadin Friday night but has not had it since due to bleeding from his upper arm incision.  He states he has bled through several shirts over the past couple of days.  He called the office and was added on to the schedule to be evaluated.  He last ate breakfast (a couple of boiled eggs) at 9am.  He denies any fever or chills.  He dialyzes on T/T/F in [place] at [place].   Earlier this year, s/p right iliac stent, right femoral endarterectomy, and right SFA stent by Dr. Trula Slade on 08/14/17. He developed swelling in his right groin that measured 2 x 6 cm by CT scan. This was thought to be a hematoma and was being managed conservatively. It slowly increased in size. He was taken to the operating room on 09/21/17 and underwent I&D of the right groin lymphocele and placement of vac dressing by Dr. Oneida Alar. The lymphocele cavity was penetrating all the way down to the level of the bovine pericardial patch. Approximately 50cc of fluid was evacuated. He presented back to the office on 10/02/17 and saw Dr. Donzetta Matters for increasing pain and swelling. The wound had healed over the cavity and this was open and there was significant serous fluid drained that relieved his pain.   He was recently admitted to [facility] with swelling and pain on the right side of his abdomen. He had a CT scan that revealed a RLQ rectus sheath intramuscular hematoma. There was also an upper right thigh fluid collection just superficial to the femoral bifurcation with sinus tract extending to the skin surface at the site of the wound vac.  On 10/14/2017 he underwent I&D of his right groin and  abdomen with placement of antibiotic beads.  This has slowly healed.   He is on a beta blocker for BP control.  He takes coumadin.  No Known Allergies  Current Outpatient Medications  Medication Sig Dispense Refill  . B Complex-C-Folic Acid (DIALYVITE 854) 0.8 MG TABS Take 1 tablet by mouth daily.  3  . BREO ELLIPTA 200-25 MCG/INH AEPB Inhale 1 puff into the lungs daily as needed (wheezing).     . calcitRIOL (ROCALTROL) 0.25 MCG capsule Take 1 capsule (0.25 mcg total) by mouth Every Tuesday,Thursday,and Saturday with dialysis. (Patient not taking: Reported on 10/13/2017) 30 capsule 0  . calcium acetate (PHOSLO) 667 MG capsule Take 667 mg by mouth 3 (three) times daily with meals.   3  . carvedilol (COREG) 25 MG tablet Take 1 tablet (25 mg total) 2 (two) times daily by mouth. 180 tablet 3  . cholecalciferol (VITAMIN D) 1000 units tablet Take 2,000 Units by mouth daily.    . Nutritional Supplements (FEEDING SUPPLEMENT, NEPRO CARB STEADY,) LIQD Take 237 mLs by mouth 2 (two) times daily between meals.  0  . oxyCODONE-acetaminophen (PERCOCET) 10-325 MG tablet Take 1 tablet by mouth every 6 (six) hours as needed for pain. 20 tablet 0  . pantoprazole (PROTONIX) 40 MG tablet Take 1 tablet (40 mg total) by mouth 2 (two) times daily. 60 tablet  1  . warfarin (COUMADIN) 5 MG tablet Take 1 tablet (5 mg total) by mouth daily. (Patient taking differently: Take 2.5-5 mg by mouth See admin instructions. 2.5mg  on Friday and 5mg  on all other days)     No current facility-administered medications for this visit.      ROS:  See HPI  Physical Exam:  Vitals:   11/23/17 1300 11/23/17 1304  BP: (!) 192/95 (!) 186/94  Pulse: 66 66  Resp: 18   Temp: 98.1 F (36.7 C)   SpO2: 100%    Vitals:   11/23/17 1300  Weight: 181 lb (82.1 kg)  Height: 6\' 3"  (1.905 m)  Body mass index is 22.62 kg/m.  Incision:  Antecubital incision is clean and dry; the upper arm incision has dehisced.  There is a moderate sized  hematoma at the upper arm incision. There is bloody drainage on the bandage and his shirts.   Extremities:  Excellent thrill in the fistula; +palpable left radial pulse.   Assessment/Plan:  This is a 62 y.o. male who is s/p: LUA AVG on 11/20/17 by Dr. Trula Slade  -pt's incision has dehisced and he has a moderate sized hematoma at the upper arm incision and it continues to bleed.  Will plan for OR today for washout of left upper arm incision.   He last ate around 9am and his last dose of coumadin was Friday night.   -pt dialyzes on T/T/F in [place].  He drove himself today so he will need to be admitted overnight.  I discussed discharging him early tomorrow to make his 11am appt for dialysis.  He states if he can't make it, he can go on Wednesday.   Leontine Locket, PA-C [facility] [phone]  Clinic MD:  Pt seen and examined with Dr. Trula Slade

## 2017-11-23 NOTE — Anesthesia Procedure Notes (Signed)
Procedure Name: Intubation Date/Time: 11/23/2017 3:01 PM Performed by: Shirlyn Goltz, CRNA Pre-anesthesia Checklist: Patient identified, Emergency Drugs available, Suction available and Patient being monitored Patient Re-evaluated:Patient Re-evaluated prior to induction Oxygen Delivery Method: Circle system utilized Preoxygenation: Pre-oxygenation with 100% oxygen Induction Type: IV induction and Rapid sequence Laryngoscope Size: Mac and 4 Grade View: Grade I Tube type: Oral Tube size: 7.5 mm Number of attempts: 1 Airway Equipment and Method: Stylet Placement Confirmation: ETT inserted through vocal cords under direct vision,  positive ETCO2 and breath sounds checked- equal and bilateral Secured at: 20 cm Tube secured with: Tape Dental Injury: Teeth and Oropharynx as per pre-operative assessment

## 2017-11-23 NOTE — Interval H&P Note (Signed)
History and Physical Interval Note:  11/23/2017 4:08 PM  Aaron [name]  has presented today for surgery, with the diagnosis of HEMATOMA LEFT UPPER ARM  The various methods of treatment have been discussed with the patient and family. After consideration of risks, benefits and other options for treatment, the patient has consented to  Procedure(s): Evacuation Hematoma LEFT UPPER ARM GRAFT (Left) as a surgical intervention .  The patient's history has been reviewed, patient examined, no change in status, stable for surgery.  I have reviewed the patient's chart and labs.  Questions were answered to the patient's satisfaction.     Ruta Hinds

## 2017-11-23 NOTE — Anesthesia Preprocedure Evaluation (Addendum)
Anesthesia Evaluation  Patient identified by MRN, date of birth, ID band Patient awake    Reviewed: Allergy & Precautions, NPO status , Patient's Chart, lab work & pertinent test results  Airway Mallampati: II  TM Distance: >3 FB Neck ROM: Full    Dental  (+) Edentulous Upper, Edentulous Lower   Pulmonary COPD, former smoker,    Pulmonary exam normal        Cardiovascular hypertension, Pt. on home beta blockers + CAD and + Peripheral Vascular Disease  Normal cardiovascular exam+ dysrhythmias Atrial Fibrillation  Rhythm:Regular Rate:Normal  Echo 08/25/17 Left ventricle: The cavity size was normal. There was moderate  concentric hypertrophy. Systolic function was normal. The  estimated ejection fraction was in the range of 55% to 60%. Wall  motion was normal; there were no regional wall motion  abnormalities. Left ventricular diastolic function parameters  were normal.   Neuro/Psych Anxiety Depression negative neurological ROS     GI/Hepatic GERD  Medicated and Controlled,(+) Hepatitis -, C  Endo/Other  negative endocrine ROS  Renal/GU CRFRenal disease     Musculoskeletal  (+) Arthritis , Osteoarthritis,    Abdominal   Peds  Hematology  (+) anemia ,   Anesthesia Other Findings   Reproductive/Obstetrics                             Lab Results  Component Value Date   CREATININE 3.97 (H) 10/21/2017   BUN 26 (H) 10/21/2017   NA 135 11/23/2017   K 3.6 11/23/2017   CL 96 (L) 10/21/2017   CO2 28 10/21/2017    Lab Results  Component Value Date   WBC 6.5 10/21/2017   HGB 11.9 (L) 11/23/2017   HCT 35.0 (L) 11/23/2017   MCV 100.3 (H) 10/21/2017   PLT 326 10/21/2017    Anesthesia Physical  Anesthesia Plan  ASA: III and emergent  Anesthesia Plan: General   Post-op Pain Management:    Induction: Intravenous  PONV Risk Score and Plan: 2 and Treatment may vary due to age or medical  condition, Ondansetron and Dexamethasone  Airway Management Planned: LMA  Additional Equipment:   Intra-op Plan:   Post-operative Plan: Extubation in OR  Informed Consent: I have reviewed the patients History and Physical, chart, labs and discussed the procedure including the risks, benefits and alternatives for the proposed anesthesia with the patient or authorized representative who has indicated his/her understanding and acceptance.   Dental advisory given  Plan Discussed with: CRNA, Anesthesiologist and Surgeon  Anesthesia Plan Comments:         Anesthesia Quick Evaluation

## 2017-11-23 NOTE — Telephone Encounter (Signed)
Patient called and stated that left arm swelling and upper incision "busted open and is bleeding" dressing changes 3-4 x per day. Instructed to wash hands keep clean and covered and elevation above level of heart. Per Dr. Trula Slade bring in today with PA to be checked/ worked in.

## 2017-11-23 NOTE — Transfer of Care (Signed)
Immediate Anesthesia Transfer of Care Note  Patient: Aaron [name]  Procedure(s) Performed: Evacuation Hematoma LEFT UPPER ARM GRAFT (Left Arm Upper)  Patient Location: PACU  Anesthesia Type:General  Level of Consciousness: awake, alert , oriented and patient cooperative  Airway & Oxygen Therapy: Patient Spontanous Breathing and Patient connected to nasal cannula oxygen  Post-op Assessment: Report given to RN and Post -op Vital signs reviewed and stable  Post vital signs: Reviewed and stable  Last Vitals:  Vitals Value Taken Time  BP 103/76 11/23/2017  3:55 PM  Temp 36.4 C 11/23/2017  3:55 PM  Pulse 86 11/23/2017  3:55 PM  Resp 17 11/23/2017  4:00 PM  SpO2 96 % 11/23/2017  3:55 PM  Vitals shown include unvalidated device data.  Last Pain:  Vitals:   11/23/17 1348  TempSrc:   PainSc: 8       Patients Stated Pain Goal: 7 (70/62/37 6283)  Complications: No apparent anesthesia complications

## 2017-11-23 NOTE — Progress Notes (Signed)
New Admission Note:   Arrival Method: Bed  Mental Orientation: Alert and oriented  Telemetry: yes Box 15  Assessment: Completed Skin: assess ecchymosis on right on right and left arm  IV: right forearm  Pain: 0/10  Tubes: Safety Measures: Safety Fall Prevention Plan has been given, discussed and signed Admission: Completed [facility] Orientation: Patient has been orientated to the room, unit and staff.  Family: none   Orders have been reviewed and implemented. Will continue to monitor the patient. Call light has been placed within reach and bed alarm has been activated.   York Valliant RN [facility] Phone: [phone]

## 2017-11-23 NOTE — Op Note (Signed)
Procedure: Evacuation of hematoma left arm  Preoperative diagnosis: Hematoma left arm  Postoperative diagnosis: Same  Anesthesia: General  Assistant: Nurse  Operative findings: Hematoma left arm 7 x 7 x 7 cm no obvious active bleeding from AV fistula  10 flat Jackson-Pratt drain  Operative details: After obtaining informed consent, patient was taken the operating.  The patient was placed in supine position operating table.  After induction of general anesthesia and placement of a laryngeal mass, the patient's entire left upper extremities prepped and draped in usual sterile fashion.  Next a pre-existing incision up in the axilla was reopened carried down through subtenons tissues down level of hematoma.  It was about 7 x 7 x 7 cm in size.  All this was evacuated.  There was no active bleeding.  The fistula was inspected and found to be patent with no evidence of bleeding proximal and distal to the site of the hematoma.  The wound was thoroughly irrigated with a liter normal saline solution.  A 10 flat Jackson-Pratt drain was placed in the bottom of the wound bed and brought out through separate stab incision and sutured the skin with a 3-0 nylon suture.  The subcutaneous tissues were reapproximated using a running 3-0 Vicryl suture.  The skin was closed with alternating 3 oh vertical mattress and 3 oh simple nylon sutures.  The patient tired the procedure well and there were no complications.  The instrument sponge needle count was correct the end of the case.  The patient was taken to recovery room in stable condition.  Ruta Hinds, MD [facility] Office: [phone] Pager: [phone]

## 2017-11-23 NOTE — H&P (View-Only) (Signed)
POST OPERATIVE OFFICE NOTE    CC:  F/u for surgery  HPI:  This is a 62 y.o. male who is s/p 2nd stage left basilic vein transposition on 11/20/17 by Dr. Trula Slade.  The pt states that his incision split open and has been bleeding.  He says that he went to bed on Friday evening and woke up to his shirt and pillows having blood on them.  He took his coumadin Friday night but has not had it since due to bleeding from his upper arm incision.  He states he has bled through several shirts over the past couple of days.  He called the office and was added on to the schedule to be evaluated.  He last ate breakfast (a couple of boiled eggs) at 9am.  He denies any fever or chills.  He dialyzes on T/T/F in [place] at [place].   Earlier this year, s/p right iliac stent, right femoral endarterectomy, and right SFA stent by Dr. Trula Slade on 08/14/17. He developed swelling in his right groin that measured 2 x 6 cm by CT scan. This was thought to be a hematoma and was being managed conservatively. It slowly increased in size. He was taken to the operating room on 09/21/17 and underwent I&D of the right groin lymphocele and placement of vac dressing by Dr. Oneida Alar. The lymphocele cavity was penetrating all the way down to the level of the bovine pericardial patch. Approximately 50cc of fluid was evacuated. He presented back to the office on 10/02/17 and saw Dr. Donzetta Matters for increasing pain and swelling. The wound had healed over the cavity and this was open and there was significant serous fluid drained that relieved his pain.   He was recently admitted to [facility] with swelling and pain on the right side of his abdomen. He had a CT scan that revealed a RLQ rectus sheath intramuscular hematoma. There was also an upper right thigh fluid collection just superficial to the femoral bifurcation with sinus tract extending to the skin surface at the site of the wound vac.  On 10/14/2017 he underwent I&D of his right groin and  abdomen with placement of antibiotic beads.  This has slowly healed.   He is on a beta blocker for BP control.  He takes coumadin.  No Known Allergies  Current Outpatient Medications  Medication Sig Dispense Refill  . B Complex-C-Folic Acid (DIALYVITE 378) 0.8 MG TABS Take 1 tablet by mouth daily.  3  . BREO ELLIPTA 200-25 MCG/INH AEPB Inhale 1 puff into the lungs daily as needed (wheezing).     . calcitRIOL (ROCALTROL) 0.25 MCG capsule Take 1 capsule (0.25 mcg total) by mouth Every Tuesday,Thursday,and Saturday with dialysis. (Patient not taking: Reported on 10/13/2017) 30 capsule 0  . calcium acetate (PHOSLO) 667 MG capsule Take 667 mg by mouth 3 (three) times daily with meals.   3  . carvedilol (COREG) 25 MG tablet Take 1 tablet (25 mg total) 2 (two) times daily by mouth. 180 tablet 3  . cholecalciferol (VITAMIN D) 1000 units tablet Take 2,000 Units by mouth daily.    . Nutritional Supplements (FEEDING SUPPLEMENT, NEPRO CARB STEADY,) LIQD Take 237 mLs by mouth 2 (two) times daily between meals.  0  . oxyCODONE-acetaminophen (PERCOCET) 10-325 MG tablet Take 1 tablet by mouth every 6 (six) hours as needed for pain. 20 tablet 0  . pantoprazole (PROTONIX) 40 MG tablet Take 1 tablet (40 mg total) by mouth 2 (two) times daily. 60 tablet  1  . warfarin (COUMADIN) 5 MG tablet Take 1 tablet (5 mg total) by mouth daily. (Patient taking differently: Take 2.5-5 mg by mouth See admin instructions. 2.5mg  on Friday and 5mg  on all other days)     No current facility-administered medications for this visit.      ROS:  See HPI  Physical Exam:  Vitals:   11/23/17 1300 11/23/17 1304  BP: (!) 192/95 (!) 186/94  Pulse: 66 66  Resp: 18   Temp: 98.1 F (36.7 C)   SpO2: 100%    Vitals:   11/23/17 1300  Weight: 181 lb (82.1 kg)  Height: 6\' 3"  (1.905 m)  Body mass index is 22.62 kg/m.  Incision:  Antecubital incision is clean and dry; the upper arm incision has dehisced.  There is a moderate sized  hematoma at the upper arm incision. There is bloody drainage on the bandage and his shirts.   Extremities:  Excellent thrill in the fistula; +palpable left radial pulse.   Assessment/Plan:  This is a 62 y.o. male who is s/p: LUA AVG on 11/20/17 by Dr. Trula Slade  -pt's incision has dehisced and he has a moderate sized hematoma at the upper arm incision and it continues to bleed.  Will plan for OR today for washout of left upper arm incision.   He last ate around 9am and his last dose of coumadin was Friday night.   -pt dialyzes on T/T/F in [place].  He drove himself today so he will need to be admitted overnight.  I discussed discharging him early tomorrow to make his 11am appt for dialysis.  He states if he can't make it, he can go on Wednesday.   Leontine Locket, PA-C [facility] [phone]  Clinic MD:  Pt seen and examined with Dr. Trula Slade

## 2017-11-24 ENCOUNTER — Encounter (HOSPITAL_COMMUNITY): Payer: Self-pay | Admitting: Vascular Surgery

## 2017-11-24 DIAGNOSIS — L7632 Postprocedural hematoma of skin and subcutaneous tissue following other procedure: Secondary | ICD-10-CM | POA: Diagnosis not present

## 2017-11-24 LAB — CBC
HCT: 25.2 % — ABNORMAL LOW (ref 39.0–52.0)
Hemoglobin: 8.4 g/dL — ABNORMAL LOW (ref 13.0–17.0)
MCH: 33.1 pg (ref 26.0–34.0)
MCHC: 33.3 g/dL (ref 30.0–36.0)
MCV: 99.2 fL (ref 78.0–100.0)
Platelets: 145 K/uL — ABNORMAL LOW (ref 150–400)
RBC: 2.54 MIL/uL — ABNORMAL LOW (ref 4.22–5.81)
RDW: 14.8 % (ref 11.5–15.5)
WBC: 4.6 K/uL (ref 4.0–10.5)

## 2017-11-24 LAB — BASIC METABOLIC PANEL WITH GFR
Anion gap: 11 (ref 5–15)
BUN: 31 mg/dL — ABNORMAL HIGH (ref 8–23)
CO2: 23 mmol/L (ref 22–32)
Calcium: 7.9 mg/dL — ABNORMAL LOW (ref 8.9–10.3)
Chloride: 101 mmol/L (ref 98–111)
Creatinine, Ser: 5.29 mg/dL — ABNORMAL HIGH (ref 0.61–1.24)
GFR calc Af Amer: 12 mL/min — ABNORMAL LOW
GFR calc non Af Amer: 11 mL/min — ABNORMAL LOW
Glucose, Bld: 164 mg/dL — ABNORMAL HIGH (ref 70–99)
Potassium: 3.8 mmol/L (ref 3.5–5.1)
Sodium: 135 mmol/L (ref 135–145)

## 2017-11-24 LAB — PROTIME-INR
INR: 0.93
Prothrombin Time: 12.4 seconds (ref 11.4–15.2)

## 2017-11-24 LAB — MRSA PCR SCREENING: MRSA by PCR: NEGATIVE

## 2017-11-24 MED ORDER — OXYCODONE HCL 5 MG PO TABS: 5.0000 mg | ORAL_TABLET | ORAL | 0 refills | Status: DC | PRN

## 2017-11-24 MED ORDER — OXYCODONE-ACETAMINOPHEN 10-325 MG PO TABS: 1.0000 | ORAL_TABLET | Freq: Four times a day (QID) | ORAL | 0 refills | Status: DC | PRN

## 2017-11-24 NOTE — Discharge Instructions (Signed)
Resume your coumadin on Friday, November 27, 2017. Follow up next week to have your INR checked.

## 2017-11-24 NOTE — Anesthesia Postprocedure Evaluation (Signed)
Anesthesia Post Note  Patient: Aaron [name]  Procedure(s) Performed: Evacuation Hematoma LEFT UPPER ARM GRAFT (Left Arm Upper)     Patient location during evaluation: PACU Anesthesia Type: General Level of consciousness: awake and alert Pain management: pain level controlled Vital Signs Assessment: post-procedure vital signs reviewed and stable Respiratory status: spontaneous breathing, nonlabored ventilation, respiratory function stable and patient connected to nasal cannula oxygen Cardiovascular status: blood pressure returned to baseline and stable Postop Assessment: no apparent nausea or vomiting Anesthetic complications: no    Last Vitals:  Vitals:   11/24/17 0832 11/24/17 0832  BP: (!) 143/69 (!) 143/69  Pulse: 63 63  Resp: 18 18  Temp: 36.8 C 36.8 C  SpO2:  97%    Last Pain:  Vitals:   11/24/17 0832  TempSrc: Oral  PainSc:                  Tiajuana Amass

## 2017-11-24 NOTE — Progress Notes (Signed)
JP Drain removed from patients left arm.  Dry sterile dressing applied. Patient tolerated procedure well.  Site unremarkable.

## 2017-11-24 NOTE — Progress Notes (Addendum)
  Progress Note    11/24/2017 7:28 AM 1 Day Post-Op  Subjective:  No complaints; says he has scheduled his dialysis from today to tomorrow.  Afebrile VSS  Vitals:   11/23/17 2223 11/24/17 0529  BP: 139/81 (!) 150/84  Pulse: 66 (!) 57  Resp: 18 20  Temp: 98.1 F (36.7 C) 98.1 F (36.7 C)  SpO2: 96% 96%    Physical Exam: General:  No distress Lungs:  Non labored Incisions:  Dressing in tact; clean and dry Extremities:  +thrill in fistula   CBC    Component Value Date/Time   WBC 6.5 10/21/2017 0400   RBC 3.12 (L) 10/21/2017 0400   HGB 11.9 (L) 11/23/2017 1411   HCT 35.0 (L) 11/23/2017 1411   PLT 326 10/21/2017 0400   MCV 100.3 (H) 10/21/2017 0400   MCH 31.4 10/21/2017 0400   MCHC 31.3 10/21/2017 0400   RDW 15.3 10/21/2017 0400   LYMPHSABS 0.4 (L) 10/10/2017 0551   MONOABS 0.9 10/10/2017 0551   EOSABS 0.0 10/10/2017 0551   BASOSABS 0.0 10/10/2017 0551    BMET    Component Value Date/Time   NA 135 11/24/2017 0623   K 3.8 11/24/2017 0623   CL 101 11/24/2017 0623   CO2 23 11/24/2017 0623   GLUCOSE 164 (H) 11/24/2017 0623   BUN 31 (H) 11/24/2017 0623   CREATININE 5.29 (H) 11/24/2017 0623   CALCIUM 7.9 (L) 11/24/2017 0623   GFRNONAA 11 (L) 11/24/2017 0623   GFRAA 12 (L) 11/24/2017 0623    INR    Component Value Date/Time   INR 0.93 11/24/2017 0623     Intake/Output Summary (Last 24 hours) at 11/24/2017 0728 Last data filed at 11/23/2017 2249 Gross per 24 hour  Intake 1575 ml  Output 46 ml  Net 1529 ml    JP drain emptied last night at 2300 with 25cc.  Has about the same in the bulb this am.    Assessment:  62 y.o. male is s/p:  Evacuation of hematoma left arm  1 Day Post-Op  Plan: -pt doing well this morning.  JP drain with ~20-25cc this am.  Will d/w Dr. Oneida Alar -will need to restart coumadin.     Leontine Locket, PA-C [facility] [phone] 11/24/2017 7:28 AM  Agree with above.  Leave drain til early evening then  d/c drain d/c home F/u Dr Trula Slade Resume Coumadin on Friday  Ruta Hinds, MD [facility] Office: [phone] Pager: [phone]

## 2017-11-24 NOTE — Progress Notes (Signed)
Patient discharged to home. Patient relative will pick him up at [place]. After visit Summary reviewed. Patient capable of reverbalizing medications and follow up visits. No signs and symptoms of distress noted. Patient educated to return to the ED in the case of an emergency. Dillon Bjork RN

## 2017-11-25 NOTE — Discharge Summary (Signed)
Discharge Summary    Aaron [name] [birth year] 62 y.o. male  [id number]  Admission Date: 11/23/2017  Discharge Date: 11/24/17  Physician: No att. providers found  Admission Diagnosis: Hematoma of arm, right, initial encounter [S40.021A]   HPI:   This is a 62 y.o. male 2nd stage left basilic vein transposition on 11/20/17 by Dr. Trula Slade.  The pt states that his incision split open and has been bleeding.  He says that he went to bed on Friday evening and woke up to his shirt and pillows having blood on them.  He took his coumadin Friday night but has not had it since due to bleeding from his upper arm incision.  He states he has bled through several shirts over the past couple of days.  He called the office and was added on to the schedule to be evaluated.  He last ate breakfast (a couple of boiled eggs) at 9am.  He denies any fever or chills.  He dialyzes on T/T/F in [facility] at [place].   Earlier this year, s/p right iliac stent, right femoral endarterectomy, and right SFA stent by Dr. Trula Slade on 08/14/17. He developed swelling in his right groin that measured 2 x 6 cm by CT scan. This was thought to be a hematoma and was being managed conservatively. It slowly increased in size. He was taken to the operating room on 09/21/17 and underwent I&D of the right groin lymphocele and placement of vac dressing by Dr. Oneida Alar. The lymphocele cavity was penetrating all the way down to the level of the bovine pericardial patch. Approximately 50cc of fluid was evacuated. He presented back to the office on 10/02/17 and saw Dr. Donzetta Matters for increasing pain and swelling. The wound had healed over the cavity and this was open and there was significant serous fluid drained that relieved his pain.   He was recently admitted to [facility] with swelling and pain on the right side of his abdomen. He had a CT scan that revealed a RLQ rectus sheath intramuscular hematoma. There was also an upper right thigh  fluid collection just superficial to the femoral bifurcation with sinus tract extending to the skin surface at the site of the wound vac.On 10/14/2017 he underwent I&D of his right groin and abdomen with placement of antibiotic beads.  This has slowly healed.   He is on a beta blocker for BP control.  He takes coumadin.  Hospital Course:  The patient was admitted to the hospital and taken to the operating room on 11/23/2017 and underwent: Evacuation of hematoma left arm   Operative findings: Hematoma left arm 7 x 7 x 7 cm no obvious active bleeding from AV fistula 10 flat Jackson-Pratt drain was placed.  The pt tolerated the procedure well and was transported to the PACU in good condition.   By POD 1, he was doing well.  His drain was left in until early evening and removed and he was discharged home.   The remainder of the hospital course consisted of increasing mobilization and increasing intake of solids without difficulty.  CBC    Component Value Date/Time   WBC 4.6 11/24/2017 0623   RBC 2.54 (L) 11/24/2017 0623   HGB 8.4 (L) 11/24/2017 0623   HCT 25.2 (L) 11/24/2017 0623   PLT 145 (L) 11/24/2017 0623   MCV 99.2 11/24/2017 0623   MCH 33.1 11/24/2017 0623   MCHC 33.3 11/24/2017 0623   RDW 14.8 11/24/2017 0623   LYMPHSABS 0.4 (L) 10/10/2017  0551   MONOABS 0.9 10/10/2017 0551   EOSABS 0.0 10/10/2017 0551   BASOSABS 0.0 10/10/2017 0551    BMET    Component Value Date/Time   NA 135 11/24/2017 0623   K 3.8 11/24/2017 0623   CL 101 11/24/2017 0623   CO2 23 11/24/2017 0623   GLUCOSE 164 (H) 11/24/2017 0623   BUN 31 (H) 11/24/2017 0623   CREATININE 5.29 (H) 11/24/2017 0623   CALCIUM 7.9 (L) 11/24/2017 0623   GFRNONAA 11 (L) 11/24/2017 0623   GFRAA 12 (L) 11/24/2017 2778        Discharge Diagnosis:  Hematoma of arm, right, initial encounter [S40.021A]  Secondary Diagnosis: Patient Active Problem List   Diagnosis Date Noted  . Hematoma of arm, right, initial  encounter 11/23/2017  . Protein-calorie malnutrition, severe 10/15/2017  . Wound infection 10/12/2017  . Hematoma of abdominal wall 10/10/2017  . Seroma due to trauma ([place]) 09/19/2017  . Volume overload   . Groin swelling 08/24/2017  . Hyperlipidemia 08/24/2017  . Duodenitis 04/27/2017  . Schatzki's ring of distal esophagus 04/27/2017  . Encounter for therapeutic drug monitoring 03/03/2017  . Abdominal pain 02/20/2017  . History of hepatitis C 02/20/2017  . Ischemic cardiomyopathy 01/27/2017  . Dyslipidemia 01/27/2017  . AF (paroxysmal atrial fibrillation) ([place]) 01/27/2017  . Preoperative clearance 01/21/2017  . CAD (coronary artery disease) 01/21/2017  . Pain of upper abdomen   . Acute renal failure (ARF) ([place]) 12/16/2016  . Rectal bleeding   . AKI (acute kidney injury) ([place]) 12/15/2016  . CKD (chronic kidney disease), stage V ([place]) 12/15/2016  . Hepatitis C 12/15/2016  . Polycystic kidney disease 12/14/2016  . Jerking 09/23/2014  . Syncope 09/23/2014  . COPD (chronic obstructive pulmonary disease) ([place])   . COLD (chronic obstructive lung disease) ([place])   . Depression   . PAD (peripheral artery disease) ([place]) 09/19/2014  . Preop cardiovascular exam 10/28/2010  . Tobacco abuse 10/28/2010  . SYNCOPE 05/07/2010  . UNSPECIFIED IRON DEFICIENCY ANEMIA 10/04/2009  . 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  . Essential hypertension 10/04/2009  . Esophageal reflux 10/04/2009  . PRECORDIAL PAIN 10/04/2009   Past Medical History:  Diagnosis Date  . Anxiety   . Arthritis    knees , back , shoulders (10/12/2017)  . Atrial fibrillation ([place])   . CAD (coronary artery disease)    Mild nonobstructive disease at cardiac catheterization 2002  . Chronic back pain    "back of my neck; down thru my legs" (10/12/2017)  . COPD (chronic obstructive pulmonary disease) ([place])   . Diverticulitis   . Esophageal reflux   . ESRD (end stage renal disease) on dialysis [facility])    "TTS; [place]" (11/23/2017)  .  Essential hypertension   . Hepatitis C    states he no longer has this  . History of kidney stones   . History of syncope   . Hyperlipidemia   . Myocardial infarction ([place]) 02/2017   "light one" (10/12/2017)  . PAT (paroxysmal atrial tachycardia) ([place])   . Peripheral arterial disease ([place])    Occluded left superficial femoral artery status post stent June 2016 - Dr. Trula Slade  . Pneumonia 1961  . Polycystic kidney, unspecified type   . Syncope 09/2014     Allergies as of 11/24/2017   No Known Allergies     Medication List    TAKE these medications   BREO ELLIPTA 200-25 MCG/INH Aepb Generic drug:  fluticasone furoate-vilanterol Inhale 1 puff into the lungs daily as needed (wheezing).  calcitRIOL 0.25 MCG capsule Commonly known as:  ROCALTROL Take 1 capsule (0.25 mcg total) by mouth Every Tuesday,Thursday,and Saturday with dialysis.   calcium acetate 667 MG capsule Commonly known as:  PHOSLO Take 667 mg by mouth 3 (three) times daily with meals.   carvedilol 25 MG tablet Commonly known as:  COREG Take 1 tablet (25 mg total) 2 (two) times daily by mouth.   cholecalciferol 1000 units tablet Commonly known as:  VITAMIN D Take 2,000 Units by mouth daily.   DIALYVITE 800 0.8 MG Tabs Take 1 tablet by mouth daily.   feeding supplement (NEPRO CARB STEADY) Liqd Take 237 mLs by mouth 2 (two) times daily between meals.   oxyCODONE-acetaminophen 10-325 MG tablet Commonly known as:  PERCOCET Take 1 tablet by mouth every 6 (six) hours as needed for pain.   pantoprazole 40 MG tablet Commonly known as:  PROTONIX Take 1 tablet (40 mg total) by mouth 2 (two) times daily.   warfarin 5 MG tablet Commonly known as:  COUMADIN Take as directed. If you are unsure how to take this medication, talk to your nurse or doctor. Original instructions:  Take 1 tablet (5 mg total) by mouth daily. What changed:    how much to take  when to take this  additional instructions        Prescriptions given: Roxicodone#15 No Refill  Instructions: 1.  No driving while taking pain medication 2. Resume your coumadin on Friday, November 27, 2017. Follow up next week to have your INR checked.   Disposition: home  Patient's condition: is Good  Follow up: 1. Dr. Trula Slade  in 2 weeks   Leontine Locket, PA-C [facility] [phone] 11/25/2017  1:49 PM

## 2017-11-30 ENCOUNTER — Ambulatory Visit (INDEPENDENT_AMBULATORY_CARE_PROVIDER_SITE_OTHER): Payer: 59 | Admitting: Surgery

## 2017-11-30 ENCOUNTER — Other Ambulatory Visit: Payer: Self-pay

## 2017-11-30 ENCOUNTER — Encounter: Payer: Self-pay | Admitting: Surgery

## 2017-11-30 VITALS — BP 148/85 | HR 80 | Temp 97.5°F | Resp 18 | Ht 76.0 in | Wt 189.0 lb

## 2017-11-30 DIAGNOSIS — Z992 Dependence on renal dialysis: Secondary | ICD-10-CM

## 2017-11-30 DIAGNOSIS — N186 End stage renal disease: Secondary | ICD-10-CM

## 2017-11-30 MED ORDER — OXYCODONE-ACETAMINOPHEN 5-325 MG PO TABS: 1.0000 | ORAL_TABLET | ORAL | 0 refills | Status: DC | PRN

## 2017-11-30 MED ORDER — CEPHALEXIN 500 MG PO CAPS: 500.0000 mg | ORAL_CAPSULE | Freq: Three times a day (TID) | ORAL | 0 refills | Status: DC

## 2017-11-30 MED ORDER — OXYCODONE-ACETAMINOPHEN 5-325 MG PO TABS: 15.0000 | ORAL_TABLET | ORAL | 0 refills | Status: DC | PRN

## 2017-11-30 NOTE — Progress Notes (Signed)
Patient name: Aaron [name] MRN: [id number] DOB: [birth year] Sex: male  REASON FOR VISIT:     post op  HISTORY OF PRESENT ILLNESS:   Aaron [name] is a 62 y.o. male returns today for follow-up.  He recently underwent second stage left basilic vein fistula.  He is on anticoagulation and unfortunately developed a axillary hematoma which was drained in the operating room.  He is here today because of persistent drainage and redness around his incision.  CURRENT MEDICATIONS:    Current Outpatient Medications  Medication Sig Dispense Refill  . B Complex-C-Folic Acid (DIALYVITE 163) 0.8 MG TABS Take 1 tablet by mouth daily.  3  . BREO ELLIPTA 200-25 MCG/INH AEPB Inhale 1 puff into the lungs daily as needed (wheezing).     . calcitRIOL (ROCALTROL) 0.25 MCG capsule Take 1 capsule (0.25 mcg total) by mouth Every Tuesday,Thursday,and Saturday with dialysis. 30 capsule 0  . calcium acetate (PHOSLO) 667 MG capsule Take 667 mg by mouth 3 (three) times daily with meals.   3  . cholecalciferol (VITAMIN D) 1000 units tablet Take 2,000 Units by mouth daily.    . Nutritional Supplements (FEEDING SUPPLEMENT, NEPRO CARB STEADY,) LIQD Take 237 mLs by mouth 2 (two) times daily between meals.  0  . warfarin (COUMADIN) 5 MG tablet Take 1 tablet (5 mg total) by mouth daily. (Patient taking differently: Take 2.5-5 mg by mouth See admin instructions. 2.5mg  on Friday and 5mg  on all other days)    . carvedilol (COREG) 25 MG tablet Take 1 tablet (25 mg total) 2 (two) times daily by mouth. 180 tablet 3  . cephALEXin (KEFLEX) 500 MG capsule Take 1 capsule (500 mg total) by mouth 3 (three) times daily for 7 days. 21 capsule 0  . oxyCODONE-acetaminophen (PERCOCET/ROXICET) 5-325 MG tablet Take 1 tablet by mouth every 4 (four) hours as needed for severe pain. 15 tablet 0  . pantoprazole (PROTONIX) 40 MG tablet Take 1 tablet (40 mg total) by mouth 2 (two) times daily. 60 tablet 1   No  current facility-administered medications for this visit.     REVIEW OF SYSTEMS:   [X]  denotes positive finding, [ ]  denotes negative finding Cardiac  Comments:  Chest pain or chest pressure:    Shortness of breath upon exertion:    Short of breath when lying flat:    Irregular heart rhythm:    Constitutional    Fever or chills:      PHYSICAL EXAM:   Vitals:   11/30/17 1341 11/30/17 1344  BP: (!) 151/88 (!) 148/85  Pulse: 80 80  Resp: 18   Temp: (!) 97.5 F (36.4 C)   TempSrc: Oral   SpO2: 98%   Weight: 189 lb (85.7 kg)   Height: 6\' 4"  (1.93 m)     GENERAL: The patient is a well-nourished male, in no acute distress. The vital signs are documented above. CARDIOVASCULAR: There is a regular rate and rhythm. PULMONARY: Non-labored respirations Good thrill within his fistula.  There is erythema around the left upper arm.  This looks more like resolving hematoma than an infection.  STUDIES:   None   MEDICAL ISSUES:   I am going to give him 1 weeks worth of Keflex just in case there is an underlying cellulitis.  I have wrapped the arm with a Kerlix and Ace wrap to help with some of the edema.  I also gave him 15 Percocet to help with some of the discomfort.  I  have been scheduled to return in 1 month for suture removal.  [name], MD [facility] [phone] Pager [phone]

## 2017-12-01 ENCOUNTER — Encounter (HOSPITAL_COMMUNITY): Payer: Self-pay

## 2017-12-01 ENCOUNTER — Emergency Department (HOSPITAL_COMMUNITY)
Admission: EM | Admit: 2017-12-01 | Discharge: 2017-12-01 | Disposition: A | Discharge status: Home / Self Care | Payer: 59 | Attending: Emergency Medicine | Admitting: Emergency Medicine

## 2017-12-01 ENCOUNTER — Other Ambulatory Visit: Payer: Self-pay

## 2017-12-01 DIAGNOSIS — Z7902 Long term (current) use of antithrombotics/antiplatelets: Secondary | ICD-10-CM | POA: Insufficient documentation

## 2017-12-01 DIAGNOSIS — Z79899 Other long term (current) drug therapy: Secondary | ICD-10-CM | POA: Insufficient documentation

## 2017-12-01 DIAGNOSIS — J449 Chronic obstructive pulmonary disease, unspecified: Secondary | ICD-10-CM | POA: Diagnosis not present

## 2017-12-01 DIAGNOSIS — F419 Anxiety disorder, unspecified: Secondary | ICD-10-CM | POA: Diagnosis not present

## 2017-12-01 DIAGNOSIS — I252 Old myocardial infarction: Secondary | ICD-10-CM | POA: Diagnosis not present

## 2017-12-01 DIAGNOSIS — N186 End stage renal disease: Secondary | ICD-10-CM | POA: Insufficient documentation

## 2017-12-01 DIAGNOSIS — Z01812 Encounter for preprocedural laboratory examination: Secondary | ICD-10-CM | POA: Diagnosis present

## 2017-12-01 DIAGNOSIS — Z87891 Personal history of nicotine dependence: Secondary | ICD-10-CM | POA: Insufficient documentation

## 2017-12-01 DIAGNOSIS — Z7901 Long term (current) use of anticoagulants: Secondary | ICD-10-CM | POA: Diagnosis not present

## 2017-12-01 DIAGNOSIS — I2510 Atherosclerotic heart disease of native coronary artery without angina pectoris: Secondary | ICD-10-CM | POA: Insufficient documentation

## 2017-12-01 DIAGNOSIS — I120 Hypertensive chronic kidney disease with stage 5 chronic kidney disease or end stage renal disease: Secondary | ICD-10-CM | POA: Diagnosis not present

## 2017-12-01 DIAGNOSIS — Z992 Dependence on renal dialysis: Secondary | ICD-10-CM | POA: Insufficient documentation

## 2017-12-01 LAB — CBC WITH DIFFERENTIAL/PLATELET
Basophils Absolute: 0 10*3/uL (ref 0.0–0.1)
Basophils Relative: 0 %
Eosinophils Absolute: 0.1 10*3/uL (ref 0.0–0.7)
Eosinophils Relative: 1 %
HEMATOCRIT: 27 % — AB (ref 39.0–52.0)
HEMOGLOBIN: 8.8 g/dL — AB (ref 13.0–17.0)
LYMPHS ABS: 0.8 10*3/uL (ref 0.7–4.0)
LYMPHS PCT: 12 %
MCH: 32.8 pg (ref 26.0–34.0)
MCHC: 32.6 g/dL (ref 30.0–36.0)
MCV: 100.7 fL — AB (ref 78.0–100.0)
Monocytes Absolute: 0.5 10*3/uL (ref 0.1–1.0)
Monocytes Relative: 7 %
NEUTROS ABS: 5.5 10*3/uL (ref 1.7–7.7)
NEUTROS PCT: 80 %
PLATELETS: 221 10*3/uL (ref 150–400)
RBC: 2.68 MIL/uL — AB (ref 4.22–5.81)
RDW: 16.2 % — ABNORMAL HIGH (ref 11.5–15.5)
WBC: 6.9 10*3/uL (ref 4.0–10.5)

## 2017-12-01 LAB — BASIC METABOLIC PANEL
ANION GAP: 7 (ref 5–15)
BUN: 44 mg/dL — ABNORMAL HIGH (ref 8–23)
CHLORIDE: 105 mmol/L (ref 98–111)
CO2: 23 mmol/L (ref 22–32)
Calcium: 8.3 mg/dL — ABNORMAL LOW (ref 8.9–10.3)
Creatinine, Ser: 5.01 mg/dL — ABNORMAL HIGH (ref 0.61–1.24)
GFR calc Af Amer: 13 mL/min — ABNORMAL LOW (ref >60)
GFR, EST NON AFRICAN AMERICAN: 11 mL/min — AB (ref >60)
Glucose, Bld: 131 mg/dL — ABNORMAL HIGH (ref 70–99)
POTASSIUM: 4.7 mmol/L (ref 3.5–5.1)
SODIUM: 135 mmol/L (ref 135–145)

## 2017-12-01 NOTE — ED Triage Notes (Addendum)
Pt has not taken 3 dialysis treatments. Had to have surgery on left arm, then that site got infected and they had to remove the infection. Pt has been on dialysis since May. Still makes urine and goes 3-4 times a day. States he feels fine and has been eating well. Per doctor, wants pt to have WBC checked and kidney function

## 2017-12-01 NOTE — Discharge Instructions (Signed)
You are evaluated in the emergency department for lab checking to see if you are stable for outpatient dialysis.  Your potassium was 4.7.  There is no evidence of fluid overload.  I discussed this with [facility] and eating and they will take you back there for outpatient dialysis.

## 2017-12-01 NOTE — ED Provider Notes (Signed)
[facility] EMERGENCY DEPARTMENT Provider Note   CSN: [id number] Arrival date & time: 12/01/17  1324     History   Chief Complaint Chief Complaint  Patient presents with  . Labs Only    HPI Aaron [name] is a 62 y.o. male.  He is here at the request of his dialysis doctor as he is missed 3 treatments.  It sounds like he had surgery to his left arm for fistula problem and was unable to make it to dialysis.  He still has access in his right upper chest.  He tried to go to dialysis but they wanted him to come here first to make sure his labs are okay.  He says he still is making urine and goes 3-4 times a day.  He denies any chest pain or shortness of breath.  No nausea no vomiting.  He is at a good appetite.  The history is provided by the patient.    Past Medical History:  Diagnosis Date  . Anxiety   . Arthritis    knees , back , shoulders (10/12/2017)  . Atrial fibrillation ([place])   . CAD (coronary artery disease)    Mild nonobstructive disease at cardiac catheterization 2002  . Chronic back pain    "back of my neck; down thru my legs" (10/12/2017)  . COPD (chronic obstructive pulmonary disease) ([place])   . Diverticulitis   . Esophageal reflux   . ESRD (end stage renal disease) on dialysis [facility])    "TTS; [place]" (11/23/2017)  . Essential hypertension   . Hepatitis C    states he no longer has this  . History of kidney stones   . History of syncope   . Hyperlipidemia   . Myocardial infarction ([place]) 02/2017   "light one" (10/12/2017)  . PAT (paroxysmal atrial tachycardia) ([place])   . Peripheral arterial disease ([place])    Occluded left superficial femoral artery status post stent June 2016 - Dr. Trula Slade  . Pneumonia 1961  . Polycystic kidney, unspecified type   . Syncope 09/2014    Patient Active Problem List   Diagnosis Date Noted  . Hematoma of arm, right, initial encounter 11/23/2017  . Protein-calorie malnutrition, severe 10/15/2017  . Wound infection 10/12/2017  .  Hematoma of abdominal wall 10/10/2017  . Seroma due to trauma ([place]) 09/19/2017  . Volume overload   . Groin swelling 08/24/2017  . Hyperlipidemia 08/24/2017  . Duodenitis 04/27/2017  . Schatzki's ring of distal esophagus 04/27/2017  . Encounter for therapeutic drug monitoring 03/03/2017  . Abdominal pain 02/20/2017  . History of hepatitis C 02/20/2017  . Ischemic cardiomyopathy 01/27/2017  . Dyslipidemia 01/27/2017  . AF (paroxysmal atrial fibrillation) ([place]) 01/27/2017  . Preoperative clearance 01/21/2017  . CAD (coronary artery disease) 01/21/2017  . Pain of upper abdomen   . Acute renal failure (ARF) ([place]) 12/16/2016  . Rectal bleeding   . AKI (acute kidney injury) ([place]) 12/15/2016  . CKD (chronic kidney disease), stage V ([place]) 12/15/2016  . Hepatitis C 12/15/2016  . Polycystic kidney disease 12/14/2016  . Jerking 09/23/2014  . Syncope 09/23/2014  . COPD (chronic obstructive pulmonary disease) ([place])   . COLD (chronic obstructive lung disease) ([place])   . Depression   . PAD (peripheral artery disease) ([place]) 09/19/2014  . Preop cardiovascular exam 10/28/2010  . Tobacco abuse 10/28/2010  . SYNCOPE 05/07/2010  . UNSPECIFIED IRON DEFICIENCY ANEMIA 10/04/2009  . 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  . Essential hypertension 10/04/2009  . Esophageal reflux 10/04/2009  .  PRECORDIAL PAIN 10/04/2009    Past Surgical History:  Procedure Laterality Date  . ABDOMINAL AORTOGRAM N/A 08/11/2017   Procedure: ABDOMINAL AORTOGRAM;  Surgeon: Serafina Mitchell, MD;  Location: [place] CV LAB;  Service: Cardiovascular;  Laterality: N/A;  . ANTERIOR CERVICAL DECOMP/DISCECTOMY FUSION    . APPLICATION OF WOUND VAC Right 08/14/2017   Procedure: APPLICATION OF PREVENA INCISIONAL WOUND VAC RIGHT GROIN;  Surgeon: Serafina Mitchell, MD;  Location[facility] OR;  Service: Vascular;  Laterality: Right;  . AV FISTULA PLACEMENT Left 09/04/2017   Procedure: ARTERIOVENOUS (AV) FISTULA CREATION LEFT UPPER ARM;  Surgeon:  Serafina Mitchell, MD;  Location: [place];  Service: Vascular;  Laterality: Left;  . BACK SURGERY    . BASCILIC VEIN TRANSPOSITION Left 11/20/2017   Procedure: SECOND STAGE BASILIC VEIN TRANSPOSITION LEFT ARM;  Surgeon: Serafina Mitchell, MD;  Location: [place];  Service: Vascular;  Laterality: Left;  . BIOPSY  12/17/2016   Procedure: BIOPSY;  Surgeon: Daneil Dolin, MD;  [facility];  Service: Gastroenterology;;  gastric colon  . BIOPSY  04/29/2017   Procedure: BIOPSY;  Surgeon: Daneil Dolin, MD;  [facility];  Service: Endoscopy;;  duodenal biopsies  . BUNIONECTOMY Bilateral   . COLONOSCOPY  2008   Dr. Oneida Alar: rare sigmoid colon diverticulosis, internal hemorrhoids.   . COLONOSCOPY WITH PROPOFOL N/A 12/17/2016   dense left-sided diverticulosis, right colon ulcers s/p biopsy query occult NSAID use vs transient ischemia, not consistent with IBD. CMV stains negative.   Marland Kitchen ENDARTERECTOMY FEMORAL Right 08/14/2017   Procedure: RIGHT ILLIO-FEMORAL ENDARTERECTOMY;  Surgeon: Serafina Mitchell, MD;  Location: [facility] OR;  Service: Vascular;  Laterality: Right;  . ESOPHAGEAL DILATION  12/17/2016   EGD with mild Schatzki's ring s/p dilatation, small hiatal hernia, erosive gastropathy (negative H.pylori gastritis)  . ESOPHAGOGASTRODUODENOSCOPY  2008   Dr. Oneida Alar: normal esophagus without Barrett's, antritis and duodenitis, path with H.pylori gastritis  . ESOPHAGOGASTRODUODENOSCOPY (EGD) WITH PROPOFOL N/A 12/17/2016   Procedure: ESOPHAGOGASTRODUODENOSCOPY (EGD) WITH PROPOFOL;  Surgeon: Daneil Dolin, MD;  [facility];  Service: Gastroenterology;  Laterality: N/A;  . ESOPHAGOGASTRODUODENOSCOPY (EGD) WITH PROPOFOL N/A 04/29/2017   Patchy erythema of gastric mucosa diffusely, extensive inflammatory changes in duodenum, geographic ulceration and mucosal edema present, encroaching somewhat on the lumen yet still widely patent, distal second portion of duodenum appeared abnormal, path with  peptic duodenitis with ulceration  . GROIN DEBRIDEMENT Right 10/14/2017   Procedure: RIGHT GROIN AND RIGHT LOWER QUADRANT ABDOMEN DEBRIDEMENT WITH PLACEMENT OF ANTIBIOTIC BEADS;  Surgeon: Serafina Mitchell, MD;  Location: [place];  Service: Vascular;  Laterality: Right;  . HERNIA REPAIR  6948   umbilical  . I&D EXTREMITY Right 09/21/2017   Procedure: IRRIGATION AND DEBRIDEMENT GROIN;  Surgeon: Elam Dutch, MD;  Location: [facility] OR;  Service: Vascular;  Laterality: Right;  . I&D EXTREMITY Left 11/23/2017   Procedure: Evacuation Hematoma LEFT UPPER ARM GRAFT;  Surgeon: Elam Dutch, MD;  Location: [place];  Service: Vascular;  Laterality: Left;  . INGUINAL HERNIA REPAIR Right 02/2017   [place]  . INSERTION OF DIALYSIS CATHETER Right 09/04/2017   Procedure: INSERTION OF TUNNELED  DIALYSIS CATHETER - RIGHT INTERNAL JUGULAR PLACEMENT;  Surgeon: Serafina Mitchell, MD;  Location: [place];  Service: Vascular;  Laterality: Right;  . INSERTION OF ILIAC STENT Right 08/14/2017   Procedure: INSERTION OF RIGHT COMMON ILIAC STENT 43mm x 60mm x 130cm INSERTION OF RIGHT EXTERNAL ILIAC STENT 39mm  x 35mm x 130cm INSERTION OF SUPERFICIAL FERMORAL ARTERY STENT 63mm x 22mm x 130cm;  Surgeon: Serafina Mitchell, MD;  Location: [facility] OR;  Service: Vascular;  Laterality: Right;  . IR FLUORO GUIDE CV LINE RIGHT  08/31/2017  . IR US GUIDE VASC ACCESS RIGHT  08/31/2017  . LOWER EXTREMITY ANGIOGRAPHY Right 08/11/2017   Procedure: LOWER EXTREMITY ANGIOGRAPHY;  Surgeon: Serafina Mitchell, MD;  Location: [place] CV LAB;  Service: Cardiovascular;  Laterality: Right;  . PATCH ANGIOPLASTY Right 08/14/2017   Procedure: PATCH ANGIOPLASTY USING HEMASHIELD PATCH 0.3IN Lillie [place];  Surgeon: Serafina Mitchell, MD;  [facility];  Service: Vascular;  Laterality: Right;  . PERIPHERAL VASCULAR CATHETERIZATION N/A 09/20/2014   Procedure: Abdominal Aortogram;  Surgeon: Serafina Mitchell, MD;  Location: [place] CV LAB;  Service: Cardiovascular;  Laterality:  N/A;  . POSTERIOR FUSION LUMBAR SPINE          Home Medications    Prior to Admission medications   Medication Sig Start Date End Date Taking? Authorizing Provider  B Complex-C-Folic Acid (DIALYVITE 235) 0.8 MG TABS Take 1 tablet by mouth daily. 09/15/17  Yes [provider]  BREO ELLIPTA 200-25 MCG/INH AEPB Inhale 1 puff into the lungs daily as needed (wheezing).  07/27/17  Yes [provider]  calcitRIOL (ROCALTROL) 0.25 MCG capsule Take 1 capsule (0.25 mcg total) by mouth Every Tuesday,Thursday,and Saturday with dialysis. 09/08/17  Yes Ghimire, Henreitta Leber, MD  calcium acetate (PHOSLO) 667 MG capsule Take 667 mg by mouth 3 (three) times daily with meals.  09/15/17  Yes [provider]  carvedilol (COREG) 25 MG tablet Take 1 tablet (25 mg total) 2 (two) times daily by mouth. 02/25/17 12/01/17 Yes Imogene Burn, PA-C  cephALEXin (KEFLEX) 500 MG capsule Take 1 capsule (500 mg total) by mouth 3 (three) times daily for 7 days. 11/30/17 12/07/17 Yes Serafina Mitchell, MD  cholecalciferol (VITAMIN D) 1000 units tablet Take 2,000 Units by mouth daily.   Yes [provider]  clopidogrel (PLAVIX) 75 MG tablet Take 1 tablet by mouth daily. 11/25/17  Yes [provider]  Nutritional Supplements (FEEDING SUPPLEMENT, NEPRO CARB STEADY,) LIQD Take 237 mLs by mouth 2 (two) times daily between meals. 10/21/17  Yes Rhyne, Hulen Shouts, PA-C  oxyCODONE-acetaminophen (PERCOCET/ROXICET) 5-325 MG tablet Take 1 tablet by mouth every 4 (four) hours as needed for severe pain. Patient taking differently: Take 2 tablets by mouth every 4 (four) hours as needed for severe pain.  11/30/17  Yes Serafina Mitchell, MD  pantoprazole (PROTONIX) 40 MG tablet Take 1 tablet (40 mg total) by mouth 2 (two) times daily. 04/30/17 12/01/17 Yes Shah, Pratik D, DO  warfarin (COUMADIN) 5 MG tablet Take 1 tablet (5 mg total) by mouth daily. Patient taking differently: Take 2.5-5 mg by mouth See admin  instructions. 2.5mg  on Friday and 5mg  on all other days 10/21/17 10/21/18 Yes Rhyne, Hulen Shouts, PA-C    Family History Family History  Problem Relation Age of Onset  . Alcoholism Mother   . Heart disease Father        Massive heart attack  . Heart attack Father   . Atrial fibrillation Father   . Colon cancer Father   . Colon cancer Maternal Grandfather 24  . Alcoholism Maternal Grandfather   . Renal cancer Cousin   . Ovarian cancer Sister     Social History Social History   Tobacco Use  . Smoking status: Former Smoker  Packs/day: 2.00    Years: 47.00    Pack years: 94.00    Types: Cigarettes    Last attempt to quit: 08/03/2017    Years since quitting: 0.3  . Smokeless tobacco: Never Used  Substance Use Topics  . Alcohol use: Not Currently    Comment: 10/12/2017 alcohol free since 2017,  heavy drinker in the past  . Drug use: Not Currently    Comment: "<2003 whatever was around; nothing since"     Allergies   Patient has no known allergies.   Review of Systems Review of Systems  Constitutional: Negative for fever.  HENT: Negative for sore throat.   Eyes: Negative for visual disturbance.  Respiratory: Negative for shortness of breath.   Cardiovascular: Negative for chest pain.  Gastrointestinal: Negative for abdominal pain.  Genitourinary: Negative for dysuria.  Musculoskeletal: Negative for neck pain.  Skin: Positive for wound (left arm). Negative for rash.  Neurological: Negative for headaches.     Physical Exam Updated Vital Signs BP (!) 179/95 (BP Location: Right Arm)   Pulse 80   Temp 98.1 F (36.7 C) (Oral)   Resp 16   SpO2 98%   Physical Exam  Constitutional: He appears well-developed and well-nourished.  HENT:  Head: Normocephalic and atraumatic.  Eyes: Conjunctivae are normal.  Neck: Neck supple.  Cardiovascular: Normal rate, regular rhythm and normal heart sounds.  Pulmonary/Chest: Effort normal and breath sounds normal. He has no wheezes.  He has no rales.  Subclavian line right upper chest with no surrounding erythema or tenderness.  Abdominal: Soft. He exhibits no mass. There is no tenderness. There is no guarding.  Musculoskeletal:  Left arm is in an Ace wrap with dressing in place.  Dressing not taken down.  distal cap refill sensory motor intact.  Neurological: He is alert. GCS eye subscore is 4. GCS verbal subscore is 5. GCS motor subscore is 6.  Skin: Skin is warm and dry. Capillary refill takes less than 2 seconds.  Psychiatric: He has a normal mood and affect.  Nursing note and vitals reviewed.    ED Treatments / Results  Labs (all labs ordered are listed, but only abnormal results are displayed) Labs Reviewed  CBC WITH DIFFERENTIAL/PLATELET - Abnormal; Notable for the following components:      Result Value   RBC 2.68 (*)    Hemoglobin 8.8 (*)    HCT 27.0 (*)    MCV 100.7 (*)    RDW 16.2 (*)    All other components within normal limits  BASIC METABOLIC PANEL    EKG EKG Interpretation  Date/Time:  Tuesday December 01 2017 14:28:03 EDT Ventricular Rate:  68 PR Interval:    QRS Duration: 89 QT Interval:  387 QTC Calculation: 412 R Axis:     Text Interpretation:  Sinus rhythm Anterior infarct, old Nonspecific T abnormalities, inferior leads sinus replacing afib, new twi inferior, anterior Confirmed by Aletta Edouard [phone]) on 12/01/2017 2:40:09 PM   Radiology No results found.  Procedures Procedures (including critical care time)  Medications Ordered in ED Medications - No data to display   Initial Impression / Assessment and Plan / ED Course  I have reviewed the triage vital signs and the nursing notes.  Pertinent labs & imaging results that were available during my care of the patient were reviewed by me and considered in my medical decision making (see chart for details).  Clinical Course as of Dec 03 1218  Tue Dec 01, 2017  1438 Patient  clinically no signs of fluid overload.  His  potassium is 4.7 here.  He is anemic but moved from his last hematocrit.   [MB]  2099 AWUJ with Natosha at [facility] in [place].  They will take him back   [MB]  3406  for outpatient dialysis.   [MB]    Clinical Course User Index [MB] Hayden Rasmussen, MD     Final Clinical Impressions(s) / ED Diagnoses   Final diagnoses:  ESRD (end stage renal disease) [facility])    ED Discharge Orders    None       Hayden Rasmussen, MD 12/02/17 1221

## 2017-12-02 ENCOUNTER — Emergency Department (HOSPITAL_COMMUNITY): Payer: 59

## 2017-12-02 ENCOUNTER — Other Ambulatory Visit: Payer: Self-pay

## 2017-12-02 ENCOUNTER — Emergency Department (HOSPITAL_COMMUNITY)
Admission: EM | Admit: 2017-12-02 | Discharge: 2017-12-02 | Disposition: A | Discharge status: Home / Self Care | Payer: 59 | Attending: Emergency Medicine | Admitting: Emergency Medicine

## 2017-12-02 DIAGNOSIS — Z79899 Other long term (current) drug therapy: Secondary | ICD-10-CM | POA: Diagnosis not present

## 2017-12-02 DIAGNOSIS — Z87891 Personal history of nicotine dependence: Secondary | ICD-10-CM | POA: Diagnosis not present

## 2017-12-02 DIAGNOSIS — Z992 Dependence on renal dialysis: Secondary | ICD-10-CM | POA: Insufficient documentation

## 2017-12-02 DIAGNOSIS — Z7901 Long term (current) use of anticoagulants: Secondary | ICD-10-CM | POA: Diagnosis not present

## 2017-12-02 DIAGNOSIS — I120 Hypertensive chronic kidney disease with stage 5 chronic kidney disease or end stage renal disease: Secondary | ICD-10-CM | POA: Insufficient documentation

## 2017-12-02 DIAGNOSIS — N186 End stage renal disease: Secondary | ICD-10-CM | POA: Insufficient documentation

## 2017-12-02 DIAGNOSIS — I252 Old myocardial infarction: Secondary | ICD-10-CM | POA: Diagnosis not present

## 2017-12-02 DIAGNOSIS — R079 Chest pain, unspecified: Secondary | ICD-10-CM | POA: Insufficient documentation

## 2017-12-02 DIAGNOSIS — R0789 Other chest pain: Secondary | ICD-10-CM

## 2017-12-02 LAB — CBC
HEMATOCRIT: 24.5 % — AB (ref 39.0–52.0)
HEMOGLOBIN: 7.9 g/dL — AB (ref 13.0–17.0)
MCH: 32.5 pg (ref 26.0–34.0)
MCHC: 32.2 g/dL (ref 30.0–36.0)
MCV: 100.8 fL — AB (ref 78.0–100.0)
Platelets: 155 10*3/uL (ref 150–400)
RBC: 2.43 MIL/uL — AB (ref 4.22–5.81)
RDW: 16 % — AB (ref 11.5–15.5)
WBC: 8.5 10*3/uL (ref 4.0–10.5)

## 2017-12-02 LAB — BASIC METABOLIC PANEL
ANION GAP: 7 (ref 5–15)
BUN: 39 mg/dL — AB (ref 8–23)
CHLORIDE: 101 mmol/L (ref 98–111)
CO2: 22 mmol/L (ref 22–32)
Calcium: 8 mg/dL — ABNORMAL LOW (ref 8.9–10.3)
Creatinine, Ser: 4.49 mg/dL — ABNORMAL HIGH (ref 0.61–1.24)
GFR, EST AFRICAN AMERICAN: 15 mL/min — AB (ref >60)
GFR, EST NON AFRICAN AMERICAN: 13 mL/min — AB (ref >60)
Glucose, Bld: 102 mg/dL — ABNORMAL HIGH (ref 70–99)
POTASSIUM: 4.9 mmol/L (ref 3.5–5.1)
SODIUM: 130 mmol/L — AB (ref 135–145)

## 2017-12-02 LAB — CBG MONITORING, ED: Glucose-Capillary: 86 mg/dL (ref 70–99)

## 2017-12-02 NOTE — ED Triage Notes (Signed)
Pt has missed past 11 days for dialysis, went today and was feeling dizzy prior to tx start. Per EMS pt c/o chest pain and dizziness shortly after starting, approx 255mL pulled off pt. Dialysis stopped and pt brought here. Non-compliant with dialysis. Denies CP at this time.

## 2017-12-02 NOTE — Discharge Instructions (Signed)
As discussed, your evaluation today has been largely reassuring.  But, it is important that you monitor your condition carefully, and do not hesitate to return to the ED if you develop new, or concerning changes in your condition. ? ?Otherwise, please follow-up with your physician for appropriate ongoing care. ? ?

## 2017-12-02 NOTE — ED Provider Notes (Signed)
[facility] EMERGENCY DEPARTMENT Provider Note   CSN: [id number] Arrival date & time: 12/02/17  1412     History   Chief Complaint Chief Complaint  Patient presents with  . Dizziness    HPI Aaron [name] is a 62 y.o. male.  HPI Patient presents from his dialysis center after an episode of chest pain. Patient is here with EMS, who assists with the HPI. The patient himself stated his pain has resolved, and he feels back to normal. Patient was seen here yesterday, after not having dialysis for possibly 10 days, discharge, and today was his first dialysis in about that same timeframe. Soon after starting dialysis he developed substernal chest pressure. Pain lasted a few moments, resolved prior to transfer. However, the patient did not receive additional dialysis beyond that point. EMS reports the patient was awake and alert, hemodynamically unremarkable during transport.  Past Medical History:  Diagnosis Date  . Anxiety   . Arthritis    knees , back , shoulders (10/12/2017)  . Atrial fibrillation ([place])   . CAD (coronary artery disease)    Mild nonobstructive disease at cardiac catheterization 2002  . Chronic back pain    "back of my neck; down thru my legs" (10/12/2017)  . COPD (chronic obstructive pulmonary disease) ([place])   . Diverticulitis   . Esophageal reflux   . ESRD (end stage renal disease) on dialysis [facility])    "TTS; [place]" (11/23/2017)  . Essential hypertension   . Hepatitis C    states he no longer has this  . History of kidney stones   . History of syncope   . Hyperlipidemia   . Myocardial infarction ([place]) 02/2017   "light one" (10/12/2017)  . PAT (paroxysmal atrial tachycardia) ([place])   . Peripheral arterial disease ([place])    Occluded left superficial femoral artery status post stent June 2016 - Dr. Trula Slade  . Pneumonia 1961  . Polycystic kidney, unspecified type   . Syncope 09/2014    Patient Active Problem List   Diagnosis Date Noted  . Hematoma of arm,  right, initial encounter 11/23/2017  . Protein-calorie malnutrition, severe 10/15/2017  . Wound infection 10/12/2017  . Hematoma of abdominal wall 10/10/2017  . Seroma due to trauma ([place]) 09/19/2017  . Volume overload   . Groin swelling 08/24/2017  . Hyperlipidemia 08/24/2017  . Duodenitis 04/27/2017  . Schatzki's ring of distal esophagus 04/27/2017  . Encounter for therapeutic drug monitoring 03/03/2017  . Abdominal pain 02/20/2017  . History of hepatitis C 02/20/2017  . Ischemic cardiomyopathy 01/27/2017  . Dyslipidemia 01/27/2017  . AF (paroxysmal atrial fibrillation) ([place]) 01/27/2017  . Preoperative clearance 01/21/2017  . CAD (coronary artery disease) 01/21/2017  . Pain of upper abdomen   . Acute renal failure (ARF) ([place]) 12/16/2016  . Rectal bleeding   . AKI (acute kidney injury) ([place]) 12/15/2016  . CKD (chronic kidney disease), stage V ([place]) 12/15/2016  . Hepatitis C 12/15/2016  . Polycystic kidney disease 12/14/2016  . Jerking 09/23/2014  . Syncope 09/23/2014  . COPD (chronic obstructive pulmonary disease) ([place])   . COLD (chronic obstructive lung disease) ([place])   . Depression   . PAD (peripheral artery disease) ([place]) 09/19/2014  . Preop cardiovascular exam 10/28/2010  . Tobacco abuse 10/28/2010  . SYNCOPE 05/07/2010  . UNSPECIFIED IRON DEFICIENCY ANEMIA 10/04/2009  . 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  . Essential hypertension 10/04/2009  . Esophageal reflux 10/04/2009  . PRECORDIAL PAIN 10/04/2009    Past Surgical History:  Procedure Laterality Date  .  ABDOMINAL AORTOGRAM N/A 08/11/2017   Procedure: ABDOMINAL AORTOGRAM;  Surgeon: Serafina Mitchell, MD;  Location: [place] [facility] LAB;  Service: Cardiovascular;  Laterality: N/A;  . ANTERIOR CERVICAL DECOMP/DISCECTOMY FUSION    . APPLICATION OF WOUND VAC Right 08/14/2017   Procedure: APPLICATION OF PREVENA INCISIONAL WOUND VAC RIGHT GROIN;  Surgeon: Serafina Mitchell, MD;  Location: [facility] OR;  Service: Vascular;  Laterality:  Right;  . AV FISTULA PLACEMENT Left 09/04/2017   Procedure: ARTERIOVENOUS (AV) FISTULA CREATION LEFT UPPER ARM;  Surgeon: Serafina Mitchell, MD;  Location: [place];  Service: Vascular;  Laterality: Left;  . BACK SURGERY    . BASCILIC VEIN TRANSPOSITION Left 11/20/2017   Procedure: SECOND STAGE BASILIC VEIN TRANSPOSITION LEFT ARM;  Surgeon: Serafina Mitchell, MD;  Location: [place];  Service: Vascular;  Laterality: Left;  . BIOPSY  12/17/2016   Procedure: BIOPSY;  Surgeon: Daneil Dolin, MD;  [facility];  Service: Gastroenterology;;  gastric colon  . BIOPSY  04/29/2017   Procedure: BIOPSY;  Surgeon: Daneil Dolin, MD;  [facility];  Service: Endoscopy;;  duodenal biopsies  . BUNIONECTOMY Bilateral   . COLONOSCOPY  2008   Dr. Oneida Alar: rare sigmoid colon diverticulosis, internal hemorrhoids.   . COLONOSCOPY WITH PROPOFOL N/A 12/17/2016   dense left-sided diverticulosis, right colon ulcers s/p biopsy query occult NSAID use vs transient ischemia, not consistent with IBD. CMV stains negative.   Marland Kitchen ENDARTERECTOMY FEMORAL Right 08/14/2017   Procedure: RIGHT ILLIO-FEMORAL ENDARTERECTOMY;  Surgeon: Serafina Mitchell, MD;  Location: [facility] OR;  Service: Vascular;  Laterality: Right;  . ESOPHAGEAL DILATION  12/17/2016   EGD with mild Schatzki's ring s/p dilatation, small hiatal hernia, erosive gastropathy (negative H.pylori gastritis)  . ESOPHAGOGASTRODUODENOSCOPY  2008   Dr. Oneida Alar: normal esophagus without Barrett's, antritis and duodenitis, path with H.pylori gastritis  . ESOPHAGOGASTRODUODENOSCOPY (EGD) WITH PROPOFOL N/A 12/17/2016   Procedure: ESOPHAGOGASTRODUODENOSCOPY (EGD) WITH PROPOFOL;  Surgeon: Daneil Dolin, MD;  [facility];  Service: Gastroenterology;  Laterality: N/A;  . ESOPHAGOGASTRODUODENOSCOPY (EGD) WITH PROPOFOL N/A 04/29/2017   Patchy erythema of gastric mucosa diffusely, extensive inflammatory changes in duodenum, geographic ulceration and mucosal edema present,  encroaching somewhat on the lumen yet still widely patent, distal second portion of duodenum appeared abnormal, path with peptic duodenitis with ulceration  . GROIN DEBRIDEMENT Right 10/14/2017   Procedure: RIGHT GROIN AND RIGHT LOWER QUADRANT ABDOMEN DEBRIDEMENT WITH PLACEMENT OF ANTIBIOTIC BEADS;  Surgeon: Serafina Mitchell, MD;  Location: [place];  Service: Vascular;  Laterality: Right;  . HERNIA REPAIR  2831   umbilical  . I&D EXTREMITY Right 09/21/2017   Procedure: IRRIGATION AND DEBRIDEMENT GROIN;  Surgeon: Elam Dutch, MD;  Location: [facility] OR;  Service: Vascular;  Laterality: Right;  . I&D EXTREMITY Left 11/23/2017   Procedure: Evacuation Hematoma LEFT UPPER ARM GRAFT;  Surgeon: Elam Dutch, MD;  Location: [place];  Service: Vascular;  Laterality: Left;  . INGUINAL HERNIA REPAIR Right 02/2017   [place]  . INSERTION OF DIALYSIS CATHETER Right 09/04/2017   Procedure: INSERTION OF TUNNELED  DIALYSIS CATHETER - RIGHT INTERNAL JUGULAR PLACEMENT;  Surgeon: Serafina Mitchell, MD;  Location: [place];  Service: Vascular;  Laterality: Right;  . INSERTION OF ILIAC STENT Right 08/14/2017   Procedure: INSERTION OF RIGHT COMMON ILIAC STENT 29mm x 28mm x 130cm INSERTION OF RIGHT EXTERNAL ILIAC STENT 7mm x 60mm x 130cm INSERTION OF SUPERFICIAL FERMORAL ARTERY STENT 72mm x 7mm x 130cm;  Surgeon: Serafina Mitchell, MD;  Location: [facility] OR;  Service: Vascular;  Laterality: Right;  . IR FLUORO GUIDE CV LINE RIGHT  08/31/2017  . IR US GUIDE VASC ACCESS RIGHT  08/31/2017  . LOWER EXTREMITY ANGIOGRAPHY Right 08/11/2017   Procedure: LOWER EXTREMITY ANGIOGRAPHY;  Surgeon: Serafina Mitchell, MD;  Location: [place] CV LAB;  Service: Cardiovascular;  Laterality: Right;  . PATCH ANGIOPLASTY Right 08/14/2017   Procedure: PATCH ANGIOPLASTY USING HEMASHIELD PATCH 0.3IN Lillie [place];  Surgeon: Serafina Mitchell, MD;  [facility];  Service: Vascular;  Laterality: Right;  . PERIPHERAL VASCULAR CATHETERIZATION N/A 09/20/2014   Procedure:  Abdominal Aortogram;  Surgeon: Serafina Mitchell, MD;  Location: [place] CV LAB;  Service: Cardiovascular;  Laterality: N/A;  . POSTERIOR FUSION LUMBAR SPINE          Home Medications    Prior to Admission medications   Medication Sig Start Date End Date Taking? Authorizing Provider  B Complex-C-Folic Acid (DIALYVITE 716) 0.8 MG TABS Take 1 tablet by mouth daily. 09/15/17  Yes [provider]  BREO ELLIPTA 200-25 MCG/INH AEPB Inhale 1 puff into the lungs daily as needed (wheezing).  07/27/17  Yes [provider]  calcitRIOL (ROCALTROL) 0.25 MCG capsule Take 1 capsule (0.25 mcg total) by mouth Every Tuesday,Thursday,and Saturday with dialysis. 09/08/17  Yes Ghimire, Henreitta Leber, MD  calcium acetate (PHOSLO) 667 MG capsule Take 667 mg by mouth 3 (three) times daily with meals.  09/15/17  Yes [provider]  carvedilol (COREG) 25 MG tablet Take 1 tablet (25 mg total) 2 (two) times daily by mouth. 02/25/17 12/02/17 Yes Imogene Burn, PA-C  cholecalciferol (VITAMIN D) 1000 units tablet Take 2,000 Units by mouth daily.   Yes [provider]  clopidogrel (PLAVIX) 75 MG tablet Take 1 tablet by mouth daily. 11/25/17  Yes [provider]  oxyCODONE-acetaminophen (PERCOCET/ROXICET) 5-325 MG tablet Take 1 tablet by mouth every 4 (four) hours as needed for severe pain. Patient taking differently: Take 2 tablets by mouth every 4 (four) hours as needed for severe pain.  11/30/17  Yes Serafina Mitchell, MD  pantoprazole (PROTONIX) 40 MG tablet Take 1 tablet (40 mg total) by mouth 2 (two) times daily. 04/30/17 12/02/17 Yes Shah, Pratik D, DO  cephALEXin (KEFLEX) 500 MG capsule Take 1 capsule (500 mg total) by mouth 3 (three) times daily for 7 days. 11/30/17 12/07/17  Serafina Mitchell, MD  Nutritional Supplements (FEEDING SUPPLEMENT, NEPRO CARB STEADY,) LIQD Take 237 mLs by mouth 2 (two) times daily between meals. Patient not taking: Reported on 12/02/2017 10/21/17   Gabriel Earing, PA-C  warfarin (COUMADIN) 5 MG tablet Take 1 tablet (5 mg total) by mouth daily. Patient taking differently: Take 2.5-5 mg by mouth See admin instructions. 2.5mg  on Friday and 5mg  on all other days 10/21/17 10/21/18  Gabriel Earing, PA-C    Family History Family History  Problem Relation Age of Onset  . Alcoholism Mother   . Heart disease Father        Massive heart attack  . Heart attack Father   . Atrial fibrillation Father   . Colon cancer Father   . Colon cancer Maternal Grandfather 89  . Alcoholism Maternal Grandfather   . Renal cancer Cousin   . Ovarian cancer Sister     Social History Social History   Tobacco Use  . Smoking status: Former Smoker    Packs/day: 2.00    Years: 47.00  Pack years: 94.00    Types: Cigarettes    Last attempt to quit: 08/03/2017    Years since quitting: 0.3  . Smokeless tobacco: Never Used  Substance Use Topics  . Alcohol use: Not Currently    Comment: 10/12/2017 alcohol free since 2017,  heavy drinker in the past  . Drug use: Not Currently    Comment: "<2003 whatever was around; nothing since"     Allergies   Patient has no known allergies.   Review of Systems Review of Systems  Constitutional:       Per HPI, otherwise negative  HENT:       Per HPI, otherwise negative  Respiratory:       Per HPI, otherwise negative  Cardiovascular:       Per HPI, otherwise negative  Gastrointestinal: Negative for vomiting.  Endocrine:       Negative aside from HPI  Genitourinary:       Neg aside from HPI   Musculoskeletal:       Per HPI, otherwise negative  Skin: Positive for wound.  Allergic/Immunologic: Positive for immunocompromised state.  Neurological: Negative for syncope.     Physical Exam Updated Vital Signs BP (!) 144/82   Pulse 69   Temp 98.4 F (36.9 C) (Oral)   Resp (!) 21   Ht 6\' 4"  (1.93 m)   Wt 85.7 kg   SpO2 92%   BMI 23.01 kg/m   Physical Exam  Constitutional: He is oriented to person, place,  and time. He appears well-developed. No distress.  HENT:  Head: Normocephalic and atraumatic.  Eyes: Conjunctivae and EOM are normal.  Cardiovascular: Normal rate and regular rhythm.  Pulmonary/Chest: Effort normal. No stridor. No respiratory distress.  Right upper chest tunneled catheter in place unremarkable  Abdominal: He exhibits no distension.  Musculoskeletal: He exhibits no edema.  Neurological: He is alert and oriented to person, place, and time.  Skin: Skin is warm and dry.  Left AV fistula wrapped in gauze  Psychiatric: He has a normal mood and affect.  Nursing note and vitals reviewed.    ED Treatments / Results  Labs (all labs ordered are listed, but only abnormal results are displayed) Labs Reviewed  BASIC METABOLIC PANEL - Abnormal; Notable for the following components:      Result Value   Sodium 130 (*)    Glucose, Bld 102 (*)    BUN 39 (*)    Creatinine, Ser 4.49 (*)    Calcium 8.0 (*)    GFR calc non Af Amer 13 (*)    GFR calc Af Amer 15 (*)    All other components within normal limits  CBC - Abnormal; Notable for the following components:   RBC 2.43 (*)    Hemoglobin 7.9 (*)    HCT 24.5 (*)    MCV 100.8 (*)    RDW 16.0 (*)    All other components within normal limits  URINALYSIS, ROUTINE W REFLEX MICROSCOPIC  CBG MONITORING, ED    EKG EKG Interpretation  Date/Time:  Wednesday December 02 2017 14:36:22 EDT Ventricular Rate:  72 PR Interval:    QRS Duration: 94 QT Interval:  384 QTC Calculation: 421 R Axis:   -15 Text Interpretation:  Sinus rhythm Atrial premature complexes Borderline left axis deviation ST-t wave abnormality Abnormal ekg Confirmed by Carmin Muskrat [id number]) on 12/02/2017 3:15:27 PM   Radiology Dg Chest 2 View  Result Date: 12/02/2017 CLINICAL DATA:  Onset of mid chest pain during dialysis,  draining wound at LEFT elbow, history atrial fibrillation, coronary artery disease post MI, COPD, hypertension, former smoker EXAM: CHEST - 2  VIEW COMPARISON:  09/04/2017 FINDINGS: RIGHT jugular central venous catheter with tip projecting over SVC. Normal heart size, mediastinal contours, and pulmonary vascularity. Lungs emphysematous but clear. No pulmonary infiltrate, pleural effusion or pneumothorax. Bones demineralized. IMPRESSION: COPD changes without acute infiltrate Electronically Signed   By: Lavonia Dana M.D.   On: 12/02/2017 17:27    Procedures Procedures (including critical care time)  Medications Ordered in ED Medications - No data to display   Initial Impression / Assessment and Plan / ED Course  I have reviewed the triage vital signs and the nursing notes.  Pertinent labs & imaging results that were available during my care of the patient were reviewed by me and considered in my medical decision making (see chart for details).     6:06 PM Patient sleeping, in no distress, hemodynamically he remains unremarkable.  This patient presents from dialysis red and episode of chest pain, which resolved prior to ED arrival. Chest pain may be secondary to dialysis itself versus spasm, as there is no evidence for ongoing coronary ischemia, with reassuring labs, EKG. Patient also has no substantial electrolyte abnormalities, including no hyperkalemia. Given his absence of complaints here, absence of hemodynamic instability, patient was discharged with instructions to have dialysis tomorrow.  Final Clinical Impressions(s) / ED Diagnoses  Atypical chest pain   Carmin Muskrat, MD 12/02/17 1807

## 2017-12-02 NOTE — ED Notes (Signed)
Pt has large amount of drainage from L bicep. States he changed his dressings today. Noted to be soaked in a yellow-brown watery d/c.

## 2017-12-04 ENCOUNTER — Emergency Department (HOSPITAL_COMMUNITY): Payer: 59

## 2017-12-04 ENCOUNTER — Inpatient Hospital Stay (HOSPITAL_COMMUNITY)
Admission: EM | Admit: 2017-12-04 | Discharge: 2017-12-08 | DRG: 252 | Disposition: A | Discharge status: Home Health | Payer: 59 | Attending: Nephrology | Admitting: Nephrology

## 2017-12-04 DIAGNOSIS — Z419 Encounter for procedure for purposes other than remedying health state, unspecified: Secondary | ICD-10-CM

## 2017-12-04 DIAGNOSIS — I951 Orthostatic hypotension: Secondary | ICD-10-CM | POA: Diagnosis present

## 2017-12-04 DIAGNOSIS — Z87442 Personal history of urinary calculi: Secondary | ICD-10-CM

## 2017-12-04 DIAGNOSIS — Z800 Family history of malignant neoplasm of digestive organs: Secondary | ICD-10-CM

## 2017-12-04 DIAGNOSIS — R4182 Altered mental status, unspecified: Secondary | ICD-10-CM | POA: Diagnosis not present

## 2017-12-04 DIAGNOSIS — T82898A Other specified complication of vascular prosthetic devices, implants and grafts, initial encounter: Principal | ICD-10-CM | POA: Diagnosis present

## 2017-12-04 DIAGNOSIS — M479 Spondylosis, unspecified: Secondary | ICD-10-CM | POA: Diagnosis present

## 2017-12-04 DIAGNOSIS — Z8051 Family history of malignant neoplasm of kidney: Secondary | ICD-10-CM

## 2017-12-04 DIAGNOSIS — D62 Acute posthemorrhagic anemia: Secondary | ICD-10-CM | POA: Diagnosis present

## 2017-12-04 DIAGNOSIS — Z7951 Long term (current) use of inhaled steroids: Secondary | ICD-10-CM

## 2017-12-04 DIAGNOSIS — E871 Hypo-osmolality and hyponatremia: Secondary | ICD-10-CM | POA: Diagnosis present

## 2017-12-04 DIAGNOSIS — I480 Paroxysmal atrial fibrillation: Secondary | ICD-10-CM | POA: Diagnosis present

## 2017-12-04 DIAGNOSIS — N186 End stage renal disease: Secondary | ICD-10-CM | POA: Diagnosis present

## 2017-12-04 DIAGNOSIS — D696 Thrombocytopenia, unspecified: Secondary | ICD-10-CM | POA: Diagnosis present

## 2017-12-04 DIAGNOSIS — L089 Local infection of the skin and subcutaneous tissue, unspecified: Secondary | ICD-10-CM | POA: Diagnosis present

## 2017-12-04 DIAGNOSIS — R509 Fever, unspecified: Secondary | ICD-10-CM

## 2017-12-04 DIAGNOSIS — M19012 Primary osteoarthritis, left shoulder: Secondary | ICD-10-CM | POA: Diagnosis present

## 2017-12-04 DIAGNOSIS — I252 Old myocardial infarction: Secondary | ICD-10-CM

## 2017-12-04 DIAGNOSIS — E785 Hyperlipidemia, unspecified: Secondary | ICD-10-CM | POA: Diagnosis present

## 2017-12-04 DIAGNOSIS — W19XXXA Unspecified fall, initial encounter: Secondary | ICD-10-CM | POA: Diagnosis present

## 2017-12-04 DIAGNOSIS — R7881 Bacteremia: Secondary | ICD-10-CM | POA: Diagnosis present

## 2017-12-04 DIAGNOSIS — T8130XA Disruption of wound, unspecified, initial encounter: Secondary | ICD-10-CM

## 2017-12-04 DIAGNOSIS — Z7901 Long term (current) use of anticoagulants: Secondary | ICD-10-CM

## 2017-12-04 DIAGNOSIS — Z8041 Family history of malignant neoplasm of ovary: Secondary | ICD-10-CM

## 2017-12-04 DIAGNOSIS — L03114 Cellulitis of left upper limb: Secondary | ICD-10-CM | POA: Diagnosis present

## 2017-12-04 DIAGNOSIS — K219 Gastro-esophageal reflux disease without esophagitis: Secondary | ICD-10-CM | POA: Diagnosis present

## 2017-12-04 DIAGNOSIS — J449 Chronic obstructive pulmonary disease, unspecified: Secondary | ICD-10-CM | POA: Diagnosis present

## 2017-12-04 DIAGNOSIS — G8929 Other chronic pain: Secondary | ICD-10-CM | POA: Diagnosis present

## 2017-12-04 DIAGNOSIS — Z992 Dependence on renal dialysis: Secondary | ICD-10-CM

## 2017-12-04 DIAGNOSIS — M898X9 Other specified disorders of bone, unspecified site: Secondary | ICD-10-CM | POA: Diagnosis present

## 2017-12-04 DIAGNOSIS — I2510 Atherosclerotic heart disease of native coronary artery without angina pectoris: Secondary | ICD-10-CM | POA: Diagnosis present

## 2017-12-04 DIAGNOSIS — D631 Anemia in chronic kidney disease: Secondary | ICD-10-CM | POA: Diagnosis present

## 2017-12-04 DIAGNOSIS — M170 Bilateral primary osteoarthritis of knee: Secondary | ICD-10-CM | POA: Diagnosis present

## 2017-12-04 DIAGNOSIS — S40022A Contusion of left upper arm, initial encounter: Secondary | ICD-10-CM | POA: Diagnosis present

## 2017-12-04 DIAGNOSIS — A498 Other bacterial infections of unspecified site: Secondary | ICD-10-CM | POA: Diagnosis present

## 2017-12-04 DIAGNOSIS — Z9115 Patient's noncompliance with renal dialysis: Secondary | ICD-10-CM

## 2017-12-04 DIAGNOSIS — Z8249 Family history of ischemic heart disease and other diseases of the circulatory system: Secondary | ICD-10-CM

## 2017-12-04 DIAGNOSIS — Z87891 Personal history of nicotine dependence: Secondary | ICD-10-CM

## 2017-12-04 DIAGNOSIS — I471 Supraventricular tachycardia: Secondary | ICD-10-CM | POA: Diagnosis present

## 2017-12-04 DIAGNOSIS — N2581 Secondary hyperparathyroidism of renal origin: Secondary | ICD-10-CM | POA: Diagnosis present

## 2017-12-04 DIAGNOSIS — R55 Syncope and collapse: Secondary | ICD-10-CM | POA: Diagnosis present

## 2017-12-04 DIAGNOSIS — E8770 Fluid overload, unspecified: Secondary | ICD-10-CM | POA: Diagnosis present

## 2017-12-04 DIAGNOSIS — M545 Low back pain: Secondary | ICD-10-CM | POA: Diagnosis present

## 2017-12-04 DIAGNOSIS — Z811 Family history of alcohol abuse and dependence: Secondary | ICD-10-CM

## 2017-12-04 DIAGNOSIS — D649 Anemia, unspecified: Secondary | ICD-10-CM

## 2017-12-04 DIAGNOSIS — I120 Hypertensive chronic kidney disease with stage 5 chronic kidney disease or end stage renal disease: Secondary | ICD-10-CM | POA: Diagnosis present

## 2017-12-04 DIAGNOSIS — M19011 Primary osteoarthritis, right shoulder: Secondary | ICD-10-CM | POA: Diagnosis present

## 2017-12-04 HISTORY — DX: Syncope and collapse: R55

## 2017-12-04 HISTORY — DX: Fasciculation: R25.3

## 2017-12-04 LAB — COMPREHENSIVE METABOLIC PANEL
ALBUMIN: 2.2 g/dL — AB (ref 3.5–5.0)
ALK PHOS: 86 U/L (ref 38–126)
ALT: 10 U/L (ref 0–44)
AST: 46 U/L — ABNORMAL HIGH (ref 15–41)
Anion gap: 9 (ref 5–15)
BILIRUBIN TOTAL: 0.6 mg/dL (ref 0.3–1.2)
BUN: 25 mg/dL — AB (ref 8–23)
CO2: 25 mmol/L (ref 22–32)
CREATININE: 2.85 mg/dL — AB (ref 0.61–1.24)
Calcium: 6.8 mg/dL — ABNORMAL LOW (ref 8.9–10.3)
Chloride: 95 mmol/L — ABNORMAL LOW (ref 98–111)
GFR calc Af Amer: 26 mL/min — ABNORMAL LOW (ref >60)
GFR calc non Af Amer: 22 mL/min — ABNORMAL LOW (ref >60)
GLUCOSE: 124 mg/dL — AB (ref 70–99)
Potassium: 3.8 mmol/L (ref 3.5–5.1)
SODIUM: 129 mmol/L — AB (ref 135–145)
TOTAL PROTEIN: 5 g/dL — AB (ref 6.5–8.1)

## 2017-12-04 LAB — PROTIME-INR
INR: 1.67
PROTHROMBIN TIME: 19.5 s — AB (ref 11.4–15.2)

## 2017-12-04 LAB — CBC WITH DIFFERENTIAL/PLATELET
BASOS PCT: 0 %
Basophils Absolute: 0 10*3/uL (ref 0.0–0.1)
EOS ABS: 0 10*3/uL (ref 0.0–0.7)
EOS PCT: 0 %
HEMATOCRIT: 18.8 % — AB (ref 39.0–52.0)
Hemoglobin: 6.5 g/dL — CL (ref 13.0–17.0)
Lymphocytes Relative: 5 %
Lymphs Abs: 0.3 10*3/uL — ABNORMAL LOW (ref 0.7–4.0)
MCH: 33 pg (ref 26.0–34.0)
MCHC: 34.6 g/dL (ref 30.0–36.0)
MCV: 95.4 fL (ref 78.0–100.0)
MONO ABS: 0.4 10*3/uL (ref 0.1–1.0)
MONOS PCT: 8 %
NEUTROS ABS: 4.7 10*3/uL (ref 1.7–7.7)
Neutrophils Relative %: 87 %
PLATELETS: 108 10*3/uL — AB (ref 150–400)
RBC: 1.97 MIL/uL — ABNORMAL LOW (ref 4.22–5.81)
RDW: 16.2 % — AB (ref 11.5–15.5)
WBC: 5.4 10*3/uL (ref 4.0–10.5)

## 2017-12-04 LAB — LACTIC ACID, PLASMA: LACTIC ACID, VENOUS: 0.9 mmol/L (ref 0.5–1.9)

## 2017-12-04 MED ORDER — SODIUM CHLORIDE 0.9% IV SOLUTION: Freq: Once | INTRAVENOUS | Status: DC

## 2017-12-04 MED ORDER — ACETAMINOPHEN 325 MG PO TABS
650.0000 mg | ORAL_TABLET | Freq: Once | ORAL | Status: AC
  Administered 2017-12-04: 650 mg via ORAL
  Filled 2017-12-04: qty 2

## 2017-12-04 MED ORDER — SODIUM CHLORIDE 0.9 % IV BOLUS (SEPSIS)
500.0000 mL | Freq: Once | INTRAVENOUS | Status: AC
  Administered 2017-12-04: 500 mL via INTRAVENOUS

## 2017-12-04 NOTE — ED Notes (Signed)
Pt given meal tray, crackers and ginger ale per DR. Eulis Foster

## 2017-12-04 NOTE — ED Notes (Signed)
EKG given to Dr. Wentz 

## 2017-12-04 NOTE — ED Triage Notes (Signed)
Pt had a port replaced in his arm recently and states it was infected. Pt has missed dialysis for 2 weeks and finally went today. Fell today. Altered mental status and low grade fever. 101.5.

## 2017-12-04 NOTE — ED Triage Notes (Signed)
Pt is dialysis pt, has missed dialysis for past 2 weeks, tonight with reported altered mental status and fever 101

## 2017-12-04 NOTE — ED Notes (Signed)
Have paged lab to draw blood work

## 2017-12-04 NOTE — ED Provider Notes (Signed)
[facility] EMERGENCY DEPARTMENT Provider Note   CSN: [id number] Arrival date & time: 12/04/17  2126     History   Chief Complaint Chief Complaint  Patient presents with  . Altered Mental Status  . Fever    HPI Aaron [name] is a 62 y.o. male.  HPI   Patient is here for evaluation of several episodes of falling, today.  The episodes occurred early this morning, prior to dialysis.  Also his ex-wife was with him when he left dialysis today and he was pale and somewhat confused.  Later tonight a friend visited him and found him more confused so called an ambulance, who brought him here.  His ex-wife is currently here and states he is more confused than usual at this time.  She states that he injured his left arm when he fell tonight.  He had surgery on his left arm to place a fistula, 2 weeks ago.  He subsequently had a hematoma drained from the left axilla on 11/23/2017.  He saw his vascular surgeon, 5 days ago at that time was thought to have a possible infected hematoma left upper arm, and was placed on Keflex.  Continues to take the Keflex.  He was also given a prescription for Percocet at that time.  Patient denies recent cough, nausea, vomiting, dysuria, urinary frequency, hematuria, constipation or diarrhea.  He missed dialysis last week because he was "not feeling well."  There are no other known modifying factors.   Past Medical History:  Diagnosis Date  . Anxiety   . Arthritis    knees , back , shoulders (10/12/2017)  . Atrial fibrillation ([place])   . CAD (coronary artery disease)    Mild nonobstructive disease at cardiac catheterization 2002  . Chronic back pain    "back of my neck; down thru my legs" (10/12/2017)  . COPD (chronic obstructive pulmonary disease) ([place])   . Diverticulitis   . Esophageal reflux   . ESRD (end stage renal disease) on dialysis [facility])    "TTS; [place]" (11/23/2017)  . Essential hypertension   . Hepatitis C    states he no longer has this  . History of  kidney stones   . History of syncope   . Hyperlipidemia   . Myocardial infarction ([place]) 02/2017   "light one" (10/12/2017)  . PAT (paroxysmal atrial tachycardia) ([place])   . Peripheral arterial disease ([place])    Occluded left superficial femoral artery status post stent June 2016 - Dr. Trula Slade  . Pneumonia 1961  . Polycystic kidney, unspecified type   . Syncope 09/2014    Patient Active Problem List   Diagnosis Date Noted  . Hematoma of arm, right, initial encounter 11/23/2017  . Protein-calorie malnutrition, severe 10/15/2017  . Wound infection 10/12/2017  . Hematoma of abdominal wall 10/10/2017  . Seroma due to trauma ([place]) 09/19/2017  . Volume overload   . Groin swelling 08/24/2017  . Hyperlipidemia 08/24/2017  . Duodenitis 04/27/2017  . Schatzki's ring of distal esophagus 04/27/2017  . Encounter for therapeutic drug monitoring 03/03/2017  . Abdominal pain 02/20/2017  . History of hepatitis C 02/20/2017  . Ischemic cardiomyopathy 01/27/2017  . Dyslipidemia 01/27/2017  . AF (paroxysmal atrial fibrillation) ([place]) 01/27/2017  . Preoperative clearance 01/21/2017  . CAD (coronary artery disease) 01/21/2017  . Pain of upper abdomen   . Acute renal failure (ARF) ([place]) 12/16/2016  . Rectal bleeding   . AKI (acute kidney injury) ([place]) 12/15/2016  . CKD (chronic kidney disease), stage  V ([place]) 12/15/2016  . Hepatitis C 12/15/2016  . Polycystic kidney disease 12/14/2016  . Jerking 09/23/2014  . Syncope 09/23/2014  . COPD (chronic obstructive pulmonary disease) ([place])   . COLD (chronic obstructive lung disease) ([place])   . Depression   . PAD (peripheral artery disease) ([place]) 09/19/2014  . Preop cardiovascular exam 10/28/2010  . Tobacco abuse 10/28/2010  . SYNCOPE 05/07/2010  . UNSPECIFIED IRON DEFICIENCY ANEMIA 10/04/2009  . 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  . Essential hypertension 10/04/2009  . Esophageal reflux 10/04/2009  . PRECORDIAL PAIN 10/04/2009    Past Surgical History:    Procedure Laterality Date  . ABDOMINAL AORTOGRAM N/A 08/11/2017   Procedure: ABDOMINAL AORTOGRAM;  Surgeon: Serafina Mitchell, MD;  Location: [place] CV LAB;  Service: Cardiovascular;  Laterality: N/A;  . ANTERIOR CERVICAL DECOMP/DISCECTOMY FUSION    . APPLICATION OF WOUND VAC Right 08/14/2017   Procedure: APPLICATION OF PREVENA INCISIONAL WOUND VAC RIGHT GROIN;  Surgeon: Serafina Mitchell, MD;  [facility] OR;  Service: Vascular;  Laterality: Right;  . AV FISTULA PLACEMENT Left 09/04/2017   Procedure: ARTERIOVENOUS (AV) FISTULA CREATION LEFT UPPER ARM;  Surgeon: Serafina Mitchell, MD;  Location: [place];  Service: Vascular;  Laterality: Left;  . BACK SURGERY    . BASCILIC VEIN TRANSPOSITION Left 11/20/2017   Procedure: SECOND STAGE BASILIC VEIN TRANSPOSITION LEFT ARM;  Surgeon: Serafina Mitchell, MD;  Location: [place];  Service: Vascular;  Laterality: Left;  . BIOPSY  12/17/2016   Procedure: BIOPSY;  Surgeon: Daneil Dolin, MD;  [facility];  Service: Gastroenterology;;  gastric colon  . BIOPSY  04/29/2017   Procedure: BIOPSY;  Surgeon: Daneil Dolin, MD;  [facility];  Service: Endoscopy;;  duodenal biopsies  . BUNIONECTOMY Bilateral   . COLONOSCOPY  2008   Dr. Oneida Alar: rare sigmoid colon diverticulosis, internal hemorrhoids.   . COLONOSCOPY WITH PROPOFOL N/A 12/17/2016   dense left-sided diverticulosis, right colon ulcers s/p biopsy query occult NSAID use vs transient ischemia, not consistent with IBD. CMV stains negative.   Marland Kitchen ENDARTERECTOMY FEMORAL Right 08/14/2017   Procedure: RIGHT ILLIO-FEMORAL ENDARTERECTOMY;  Surgeon: Serafina Mitchell, MD;  Location: [facility] OR;  Service: Vascular;  Laterality: Right;  . ESOPHAGEAL DILATION  12/17/2016   EGD with mild Schatzki's ring s/p dilatation, small hiatal hernia, erosive gastropathy (negative H.pylori gastritis)  . ESOPHAGOGASTRODUODENOSCOPY  2008   Dr. Oneida Alar: normal esophagus without Barrett's, antritis and duodenitis, path with  H.pylori gastritis  . ESOPHAGOGASTRODUODENOSCOPY (EGD) WITH PROPOFOL N/A 12/17/2016   Procedure: ESOPHAGOGASTRODUODENOSCOPY (EGD) WITH PROPOFOL;  Surgeon: Daneil Dolin, MD;  [facility];  Service: Gastroenterology;  Laterality: N/A;  . ESOPHAGOGASTRODUODENOSCOPY (EGD) WITH PROPOFOL N/A 04/29/2017   Patchy erythema of gastric mucosa diffusely, extensive inflammatory changes in duodenum, geographic ulceration and mucosal edema present, encroaching somewhat on the lumen yet still widely patent, distal second portion of duodenum appeared abnormal, path with peptic duodenitis with ulceration  . GROIN DEBRIDEMENT Right 10/14/2017   Procedure: RIGHT GROIN AND RIGHT LOWER QUADRANT ABDOMEN DEBRIDEMENT WITH PLACEMENT OF ANTIBIOTIC BEADS;  Surgeon: Serafina Mitchell, MD;  Location: [place];  Service: Vascular;  Laterality: Right;  . HERNIA REPAIR  6144   umbilical  . I&D EXTREMITY Right 09/21/2017   Procedure: IRRIGATION AND DEBRIDEMENT GROIN;  Surgeon: Elam Dutch, MD;  Location: [facility] OR;  Service: Vascular;  Laterality: Right;  . I&D EXTREMITY Left 11/23/2017   Procedure: Evacuation Hematoma LEFT UPPER ARM GRAFT;  Surgeon: Elam Dutch, MD;  Location: [place];  Service: Vascular;  Laterality: Left;  . INGUINAL HERNIA REPAIR Right 02/2017   [place]  . INSERTION OF DIALYSIS CATHETER Right 09/04/2017   Procedure: INSERTION OF TUNNELED  DIALYSIS CATHETER - RIGHT INTERNAL JUGULAR PLACEMENT;  Surgeon: Serafina Mitchell, MD;  Location: [place];  Service: Vascular;  Laterality: Right;  . INSERTION OF ILIAC STENT Right 08/14/2017   Procedure: INSERTION OF RIGHT COMMON ILIAC STENT 103mm x 39mm x 130cm INSERTION OF RIGHT EXTERNAL ILIAC STENT 36mm x 21mm x 130cm INSERTION OF SUPERFICIAL FERMORAL ARTERY STENT 82mm x 80mm x 130cm;  Surgeon: Serafina Mitchell, MD;  Location[facility] OR;  Service: Vascular;  Laterality: Right;  . IR FLUORO GUIDE CV LINE RIGHT  08/31/2017  . IR US GUIDE VASC ACCESS RIGHT  08/31/2017  . LOWER  EXTREMITY ANGIOGRAPHY Right 08/11/2017   Procedure: LOWER EXTREMITY ANGIOGRAPHY;  Surgeon: Serafina Mitchell, MD;  Location: [place] CV LAB;  Service: Cardiovascular;  Laterality: Right;  . PATCH ANGIOPLASTY Right 08/14/2017   Procedure: PATCH ANGIOPLASTY USING HEMASHIELD PATCH 0.3IN Lillie [place];  Surgeon: Serafina Mitchell, MD;  [facility];  Service: Vascular;  Laterality: Right;  . PERIPHERAL VASCULAR CATHETERIZATION N/A 09/20/2014   Procedure: Abdominal Aortogram;  Surgeon: Serafina Mitchell, MD;  Location: [place] CV LAB;  Service: Cardiovascular;  Laterality: N/A;  . POSTERIOR FUSION LUMBAR SPINE          Home Medications    Prior to Admission medications   Medication Sig Start Date End Date Taking? Authorizing Provider  B Complex-C-Folic Acid (DIALYVITE 782) 0.8 MG TABS Take 1 tablet by mouth daily. 09/15/17  Yes [provider]  BREO ELLIPTA 200-25 MCG/INH AEPB Inhale 1 puff into the lungs daily as needed (wheezing).  07/27/17  Yes [provider]  calcitRIOL (ROCALTROL) 0.25 MCG capsule Take 1 capsule (0.25 mcg total) by mouth Every Tuesday,Thursday,and Saturday with dialysis. 09/08/17  Yes Ghimire, Henreitta Leber, MD  calcium acetate (PHOSLO) 667 MG capsule Take 667 mg by mouth 3 (three) times daily with meals.  09/15/17  Yes [provider]  carvedilol (COREG) 25 MG tablet Take 1 tablet (25 mg total) 2 (two) times daily by mouth. 02/25/17 12/04/17 Yes Imogene Burn, PA-C  cholecalciferol (VITAMIN D) 1000 units tablet Take 2,000 Units by mouth daily.   Yes [provider]  oxyCODONE-acetaminophen (PERCOCET/ROXICET) 5-325 MG tablet Take 1 tablet by mouth every 4 (four) hours as needed for severe pain. Patient taking differently: Take 2 tablets by mouth every 4 (four) hours as needed for severe pain.  11/30/17  Yes Serafina Mitchell, MD  pantoprazole (PROTONIX) 40 MG tablet Take 1 tablet (40 mg total) by mouth 2 (two) times daily. 04/30/17 12/04/17 Yes Shah, Pratik  D, DO  warfarin (COUMADIN) 5 MG tablet Take 1 tablet (5 mg total) by mouth daily. Patient taking differently: Take 2.5-5 mg by mouth See admin instructions. 2.5mg  on Friday and 5mg  on all other days 10/21/17 10/21/18 Yes Rhyne, Hulen Shouts, PA-C  cephALEXin (KEFLEX) 500 MG capsule Take 1 capsule (500 mg total) by mouth 3 (three) times daily for 7 days. 11/30/17 12/07/17  Serafina Mitchell, MD  Nutritional Supplements (FEEDING SUPPLEMENT, NEPRO CARB STEADY,) LIQD Take 237 mLs by mouth 2 (two) times daily between meals. Patient not taking: Reported on 12/02/2017 10/21/17   Gabriel Earing, PA-C    Family History Family History  Problem Relation Age of Onset  .  Alcoholism Mother   . Heart disease Father        Massive heart attack  . Heart attack Father   . Atrial fibrillation Father   . Colon cancer Father   . Colon cancer Maternal Grandfather 24  . Alcoholism Maternal Grandfather   . Renal cancer Cousin   . Ovarian cancer Sister     Social History Social History   Tobacco Use  . Smoking status: Former Smoker    Packs/day: 2.00    Years: 47.00    Pack years: 94.00    Types: Cigarettes    Last attempt to quit: 08/03/2017    Years since quitting: 0.3  . Smokeless tobacco: Never Used  Substance Use Topics  . Alcohol use: Not Currently    Comment: 10/12/2017 alcohol free since 2017,  heavy drinker in the past  . Drug use: Not Currently    Comment: "<2003 whatever was around; nothing since"     Allergies   Patient has no known allergies.   Review of Systems Review of Systems  All other systems reviewed and are negative.    Physical Exam Updated Vital Signs BP 99/67   Pulse 75   Temp (!) 101.3 F (38.5 C) (Rectal)   Resp 17   SpO2 98%   Physical Exam  Constitutional: He is oriented to person, place, and time. He appears well-developed. No distress.  Frail, appears older than stated age, poor hygiene.  HENT:  Head: Normocephalic and atraumatic.  Right Ear: External ear  normal.  Left Ear: External ear normal.  Eyes: Pupils are equal, round, and reactive to light. Conjunctivae and EOM are normal.  Neck: Normal range of motion and phonation normal. Neck supple. No thyromegaly present.  No meningismus he is able to touch his chin to his chest without difficulty.  Cardiovascular: Normal rate, regular rhythm and normal heart sounds.  Vascular dialysis catheter, tunneled, right upper chest wall, site and appliance appear normal.  Pulmonary/Chest: Effort normal and breath sounds normal. He exhibits no bony tenderness.  Abdominal: Soft. There is no tenderness.  Musculoskeletal: Normal range of motion.  2 wounds left medial upper arm, lower 1 about 1.5 cm in length, open, with blood clot present, but no active bleeding or drainage of pus.  Wound above this is sutured, and has one area where it appears that a stitch has popped out and there is mild bleeding, which appears to be from a collection of subcutaneous blood, which is palpable from the mid upper arm to near the elbow region.  He is neurovascularly intact distally in the left hand.  I could not palpate a fistula in the left arm.  Neurological: He is alert and oriented to person, place, and time. No cranial nerve deficit or sensory deficit. He exhibits normal muscle tone. Coordination normal.  No aphasia or nystagmus.  Normal strength arms and legs bilaterally.  Skin: Skin is warm, dry and intact.  Psychiatric: He has a normal mood and affect. His behavior is normal.  He is not responding to internal stimuli.  Nursing note and vitals reviewed.    ED Treatments / Results  Labs (all labs ordered are listed, but only abnormal results are displayed) Labs Reviewed  COMPREHENSIVE METABOLIC PANEL - Abnormal; Notable for the following components:      Result Value   Sodium 129 (*)    Chloride 95 (*)    Glucose, Bld 124 (*)    BUN 25 (*)    Creatinine, Ser 2.85 (*)  Calcium 6.8 (*)    Total Protein 5.0 (*)     Albumin 2.2 (*)    AST 46 (*)    GFR calc non Af Amer 22 (*)    GFR calc Af Amer 26 (*)    All other components within normal limits  CBC WITH DIFFERENTIAL/PLATELET - Abnormal; Notable for the following components:   RBC 1.97 (*)    Hemoglobin 6.5 (*)    HCT 18.8 (*)    RDW 16.2 (*)    Platelets 108 (*)    Lymphs Abs 0.3 (*)    All other components within normal limits  PROTIME-INR - Abnormal; Notable for the following components:   Prothrombin Time 19.5 (*)    All other components within normal limits  CULTURE, BLOOD (ROUTINE X 2)  CULTURE, BLOOD (ROUTINE X 2)  LACTIC ACID, PLASMA  URINALYSIS, ROUTINE W REFLEX MICROSCOPIC  LACTIC ACID, PLASMA  TYPE AND SCREEN  PREPARE RBC (CROSSMATCH)    EKG EKG Interpretation  Date/Time:  Friday December 04 2017 21:48:44 EDT Ventricular Rate:  83 PR Interval:    QRS Duration: 104 QT Interval:  404 QTC Calculation: 475 R Axis:   -12 Text Interpretation:  Sinus rhythm Atrial premature complex Probable anteroseptal infarct, old since last tracing no significant change Confirmed by Daleen Bo [phone]) on 12/04/2017 9:57:02 PM   Radiology Dg Chest 2 View  Result Date: 12/04/2017 CLINICAL DATA:  Altered mentation and fever tonight after missing 2 dialysis treatments. Former smoker. EXAM: CHEST - 2 VIEW COMPARISON:  12/02/2017 FINDINGS: Dual lumen dialysis catheter tip terminates in the distal SVC. Heart size is top-normal. There is mild aortic atherosclerosis without aneurysm. Hyperinflated lungs with bibasilar atelectasis. No confluent airspace disease. Slight increase in interstitial prominence may reflect mild interstitial edema. No effusion or pneumothorax. IMPRESSION: Slight increase in interstitial prominence since prior with mild central vascular congestion. Findings may reflect mild interstitial edema. Otherwise negative. Electronically Signed   By: Ashley Royalty M.D.   On: 12/04/2017 22:19    Procedures .Critical Care Performed by:  Daleen Bo, MD Authorized by: Daleen Bo, MD   Critical care provider statement:    Critical care time (minutes):  35   Critical care start time:  12/04/2017 10:02 PM   Critical care end time:  12/04/2017 11:56 PM   Critical care time was exclusive of:  Separately billable procedures and treating other patients   Critical care was necessary to treat or prevent imminent or life-threatening deterioration of the following conditions:  Circulatory failure   Critical care was time spent personally by me on the following activities:  Blood draw for specimens, development of treatment plan with patient or surrogate, discussions with consultants, evaluation of patient's response to treatment, examination of patient, obtaining history from patient or surrogate, ordering and performing treatments and interventions, ordering and review of laboratory studies, pulse oximetry, re-evaluation of patient's condition, review of old charts and ordering and review of radiographic studies   (including critical care time)  Medications Ordered in ED Medications  0.9 %  sodium chloride infusion (Manually program via Guardrails IV Fluids) (has no administration in time range)  acetaminophen (TYLENOL) tablet 650 mg (650 mg Oral Given 12/04/17 2222)  sodium chloride 0.9 % bolus 500 mL (0 mLs Intravenous Stopped 12/04/17 2358)     Initial Impression / Assessment and Plan / ED Course  I have reviewed the triage vital signs and the nursing notes.  Pertinent labs & imaging results that were available during  my care of the patient were reviewed by me and considered in my medical decision making (see chart for details).  Clinical Course as of Dec 05 9  Fri Dec 04, 2017  2323 Normal INR  Protime-INR(!) [EW]  2323 Normal except sodium low, chloride low, glucose high, BUN high, creatinine high, calcium low, total protein low, albumin low, AST high  Comprehensive metabolic panel(!) [EW]  7124 Normal  Lactic acid,  plasma [EW]  2324 Normal except hemoglobin low, platelets low  CBC with Differential(!!) [EW]    Clinical Course User Index [EW] Daleen Bo, MD     Patient Vitals for the past 24 hrs:  BP Temp Temp src Pulse Resp SpO2  12/04/17 2330 99/67 - - 75 17 98 %  12/04/17 2300 (!) 85/53 - - 72 14 99 %  12/04/17 2230 (!) 79/64 - - 85 (!) 24 100 %  12/04/17 2221 (!) 87/61 - - 82 18 94 %  12/04/17 2145 - - - 86 20 96 %  12/04/17 2143 - (!) 101.3 F (38.5 C) Rectal - - -  12/04/17 2133 93/65 99.4 F (37.4 C) Oral 83 20 95 %  12/04/17 2130 93/65 - - - - -    11:53 PM Reevaluation with update and discussion. After initial assessment and treatment, an updated evaluation reveals patient is more alert, and remains comfortable.  He continues to move the neck fully without limitation, there is no evidence of meningismus.  Findings discussed with patient and ex-wife, all questions were answered. Daleen Bo   Medical Decision Making: Falling, with confusion, and fever.  Patient with significant hemoglobin drop over the last 3 days, associated with right arm hematoma, from recent surgery.  Doubt ongoing active bleeding.  No clear source for fever.  Doubt meningitis or encephalitis.  Patient will require admission for transfusion, and can be observed for mental status change.  Doubt severe sepsis or metabolic instability.  Patient's next dialysis appointment is tomorrow at 11 AM.  CRITICAL CARE-yes Performed by: Daleen Bo   Nursing Notes Reviewed/ Care Coordinated Applicable Imaging Reviewed Interpretation of Laboratory Data incorporated into ED treatment   12:01 AM-Consult complete with hospitalist. Patient case explained and discussed.  He agrees to admit patient for further evaluation and treatment. Call ended at 12:10 AM  Plan: Admit  Final Clinical Impressions(s) / ED Diagnoses   Final diagnoses:  Anemia, unspecified type  Fever, unspecified fever cause  Fall, initial encounter    Wound dehiscence    ED Discharge Orders    None       Daleen Bo, MD 12/05/17 [phone]

## 2017-12-04 NOTE — ED Notes (Signed)
Per Dr. Thurnell Garbe, able to give pt 249mL bolus.

## 2017-12-04 NOTE — ED Notes (Signed)
Lab in room drawing blood work

## 2017-12-04 NOTE — ED Notes (Signed)
Will call lab to draw blood work

## 2017-12-04 NOTE — ED Notes (Addendum)
CRITICAL VALUE ALERT  Critical Value:  Hg 6.5   Date & Time Notied:  12/04/17 2242  Provider Notified: Yes  Orders Received/Actions taken: None Yet

## 2017-12-05 ENCOUNTER — Encounter (HOSPITAL_COMMUNITY): Payer: Self-pay

## 2017-12-05 ENCOUNTER — Other Ambulatory Visit: Payer: Self-pay

## 2017-12-05 DIAGNOSIS — R509 Fever, unspecified: Secondary | ICD-10-CM

## 2017-12-05 DIAGNOSIS — D62 Acute posthemorrhagic anemia: Secondary | ICD-10-CM

## 2017-12-05 DIAGNOSIS — T8130XA Disruption of wound, unspecified, initial encounter: Secondary | ICD-10-CM

## 2017-12-05 DIAGNOSIS — R55 Syncope and collapse: Secondary | ICD-10-CM

## 2017-12-05 DIAGNOSIS — D649 Anemia, unspecified: Secondary | ICD-10-CM | POA: Diagnosis not present

## 2017-12-05 DIAGNOSIS — W19XXXA Unspecified fall, initial encounter: Secondary | ICD-10-CM | POA: Diagnosis not present

## 2017-12-05 LAB — COMPREHENSIVE METABOLIC PANEL
ALT: 12 U/L (ref 0–44)
ANION GAP: 6 (ref 5–15)
AST: 42 U/L — ABNORMAL HIGH (ref 15–41)
Albumin: 2.2 g/dL — ABNORMAL LOW (ref 3.5–5.0)
Alkaline Phosphatase: 89 U/L (ref 38–126)
BUN: 32 mg/dL — ABNORMAL HIGH (ref 8–23)
CALCIUM: 6.8 mg/dL — AB (ref 8.9–10.3)
CO2: 27 mmol/L (ref 22–32)
Chloride: 97 mmol/L — ABNORMAL LOW (ref 98–111)
Creatinine, Ser: 3.15 mg/dL — ABNORMAL HIGH (ref 0.61–1.24)
GFR calc non Af Amer: 20 mL/min — ABNORMAL LOW (ref >60)
GFR, EST AFRICAN AMERICAN: 23 mL/min — AB (ref >60)
Glucose, Bld: 128 mg/dL — ABNORMAL HIGH (ref 70–99)
POTASSIUM: 3.9 mmol/L (ref 3.5–5.1)
Sodium: 130 mmol/L — ABNORMAL LOW (ref 135–145)
TOTAL PROTEIN: 5.2 g/dL — AB (ref 6.5–8.1)
Total Bilirubin: 0.5 mg/dL (ref 0.3–1.2)

## 2017-12-05 LAB — BLOOD CULTURE ID PANEL (REFLEXED)
ACINETOBACTER BAUMANNII: NOT DETECTED
CANDIDA ALBICANS: NOT DETECTED
CANDIDA GLABRATA: NOT DETECTED
CANDIDA TROPICALIS: NOT DETECTED
Candida krusei: NOT DETECTED
Candida parapsilosis: NOT DETECTED
Carbapenem resistance: NOT DETECTED
ENTEROBACTER CLOACAE COMPLEX: NOT DETECTED
Enterobacteriaceae species: DETECTED — AB
Enterococcus species: NOT DETECTED
Escherichia coli: NOT DETECTED
Haemophilus influenzae: NOT DETECTED
KLEBSIELLA PNEUMONIAE: NOT DETECTED
Klebsiella oxytoca: NOT DETECTED
Listeria monocytogenes: NOT DETECTED
Neisseria meningitidis: NOT DETECTED
PROTEUS SPECIES: NOT DETECTED
Pseudomonas aeruginosa: NOT DETECTED
STREPTOCOCCUS AGALACTIAE: NOT DETECTED
STREPTOCOCCUS PYOGENES: NOT DETECTED
Serratia marcescens: DETECTED — AB
Staphylococcus aureus (BCID): NOT DETECTED
Staphylococcus species: NOT DETECTED
Streptococcus pneumoniae: NOT DETECTED
Streptococcus species: NOT DETECTED

## 2017-12-05 LAB — PREPARE RBC (CROSSMATCH)

## 2017-12-05 LAB — CBC
HCT: 21.4 % — ABNORMAL LOW (ref 39.0–52.0)
Hemoglobin: 7.2 g/dL — ABNORMAL LOW (ref 13.0–17.0)
MCH: 32.6 pg (ref 26.0–34.0)
MCHC: 33.6 g/dL (ref 30.0–36.0)
MCV: 96.8 fL (ref 78.0–100.0)
Platelets: 96 10*3/uL — ABNORMAL LOW (ref 150–400)
RBC: 2.21 MIL/uL — ABNORMAL LOW (ref 4.22–5.81)
RDW: 15.8 % — AB (ref 11.5–15.5)
WBC: 3.4 10*3/uL — ABNORMAL LOW (ref 4.0–10.5)

## 2017-12-05 LAB — LACTIC ACID, PLASMA: Lactic Acid, Venous: 1.6 mmol/L (ref 0.5–1.9)

## 2017-12-05 LAB — TROPONIN I: Troponin I: 0.04 ng/mL (ref <0.03)

## 2017-12-05 MED ORDER — CARVEDILOL 25 MG PO TABS
25.0000 mg | ORAL_TABLET | Freq: Two times a day (BID) | ORAL | Status: DC
  Administered 2017-12-05 – 2017-12-08 (×6): 25 mg via ORAL
  Filled 2017-12-05 (×3): qty 1
  Filled 2017-12-05 (×2): qty 2
  Filled 2017-12-05 (×2): qty 1

## 2017-12-05 MED ORDER — CALCITRIOL 0.25 MCG PO CAPS
0.2500 ug | ORAL_CAPSULE | ORAL | Status: DC
  Administered 2017-12-05 – 2017-12-07 (×2): 0.25 ug via ORAL
  Filled 2017-12-05: qty 1

## 2017-12-05 MED ORDER — HEPARIN SODIUM (PORCINE) 1000 UNIT/ML IJ SOLN
INTRAMUSCULAR | Status: AC
  Administered 2017-12-05: 1800 [IU] via INTRAVENOUS_CENTRAL
  Filled 2017-12-05: qty 1

## 2017-12-05 MED ORDER — IPRATROPIUM-ALBUTEROL 0.5-2.5 (3) MG/3ML IN SOLN
3.0000 mL | Freq: Four times a day (QID) | RESPIRATORY_TRACT | Status: DC
  Administered 2017-12-05 – 2017-12-06 (×7): 3 mL via RESPIRATORY_TRACT
  Filled 2017-12-05 (×6): qty 3

## 2017-12-05 MED ORDER — PANTOPRAZOLE SODIUM 40 MG PO TBEC
40.0000 mg | DELAYED_RELEASE_TABLET | Freq: Every day | ORAL | Status: DC
  Administered 2017-12-05 – 2017-12-08 (×3): 40 mg via ORAL
  Filled 2017-12-05 (×4): qty 1

## 2017-12-05 MED ORDER — SODIUM CHLORIDE 0.9% FLUSH
3.0000 mL | INTRAVENOUS | Status: DC | PRN
  Administered 2017-12-07 – 2017-12-08 (×2): 3 mL via INTRAVENOUS
  Filled 2017-12-05 (×2): qty 3

## 2017-12-05 MED ORDER — ONDANSETRON HCL 4 MG/2ML IJ SOLN: 4.0000 mg | Freq: Four times a day (QID) | INTRAMUSCULAR | Status: DC | PRN

## 2017-12-05 MED ORDER — EPOETIN ALFA 10000 UNIT/ML IJ SOLN
10000.0000 [IU] | INTRAMUSCULAR | Status: DC
  Administered 2017-12-05 – 2017-12-07 (×2): 10000 [IU] via INTRAVENOUS
  Filled 2017-12-05 (×3): qty 1

## 2017-12-05 MED ORDER — HEPARIN SODIUM (PORCINE) 1000 UNIT/ML DIALYSIS
1000.0000 [IU] | INTRAMUSCULAR | Status: DC | PRN
  Administered 2017-12-05: 3400 [IU] via INTRAVENOUS_CENTRAL
  Filled 2017-12-05 (×2): qty 1

## 2017-12-05 MED ORDER — ACETAMINOPHEN 650 MG RE SUPP: 650.0000 mg | Freq: Four times a day (QID) | RECTAL | Status: DC | PRN

## 2017-12-05 MED ORDER — VITAMIN K1 10 MG/ML IJ SOLN
INTRAMUSCULAR | Status: AC
  Filled 2017-12-05: qty 1

## 2017-12-05 MED ORDER — WARFARIN - PHARMACIST DOSING INPATIENT: Freq: Every day | Status: DC

## 2017-12-05 MED ORDER — CALCIUM ACETATE (PHOS BINDER) 667 MG PO CAPS
667.0000 mg | ORAL_CAPSULE | Freq: Three times a day (TID) | ORAL | Status: DC
  Administered 2017-12-05 – 2017-12-08 (×8): 667 mg via ORAL
  Filled 2017-12-05 (×8): qty 1

## 2017-12-05 MED ORDER — ONDANSETRON HCL 4 MG PO TABS: 4.0000 mg | ORAL_TABLET | Freq: Four times a day (QID) | ORAL | Status: DC | PRN

## 2017-12-05 MED ORDER — SODIUM CHLORIDE 0.9 % IV SOLN
100.0000 mL | INTRAVENOUS | Status: DC | PRN
  Administered 2017-12-05: 100 mL via INTRAVENOUS

## 2017-12-05 MED ORDER — RENA-VITE PO TABS
1.0000 | ORAL_TABLET | Freq: Every day | ORAL | Status: DC
  Administered 2017-12-05 – 2017-12-08 (×3): 1 via ORAL
  Filled 2017-12-05 (×6): qty 1

## 2017-12-05 MED ORDER — ACETAMINOPHEN 325 MG PO TABS
650.0000 mg | ORAL_TABLET | Freq: Four times a day (QID) | ORAL | Status: DC | PRN
  Administered 2017-12-05: 650 mg via ORAL
  Filled 2017-12-05: qty 2

## 2017-12-05 MED ORDER — WARFARIN SODIUM 5 MG PO TABS
5.0000 mg | ORAL_TABLET | Freq: Once | ORAL | Status: AC
  Administered 2017-12-05: 5 mg via ORAL
  Filled 2017-12-05: qty 1

## 2017-12-05 MED ORDER — CHLORHEXIDINE GLUCONATE CLOTH 2 % EX PADS
6.0000 | MEDICATED_PAD | Freq: Every day | CUTANEOUS | Status: DC
  Administered 2017-12-05 – 2017-12-08 (×3): 6 via TOPICAL

## 2017-12-05 MED ORDER — OXYCODONE-ACETAMINOPHEN 5-325 MG PO TABS
1.0000 | ORAL_TABLET | ORAL | Status: DC | PRN
  Administered 2017-12-05 – 2017-12-06 (×6): 1 via ORAL
  Filled 2017-12-05 (×6): qty 1

## 2017-12-05 MED ORDER — VITAMIN K1 10 MG/ML IJ SOLN
5.0000 mg | Freq: Once | INTRAVENOUS | Status: AC
  Administered 2017-12-05: 5 mg via INTRAVENOUS
  Filled 2017-12-05: qty 0.5

## 2017-12-05 MED ORDER — SODIUM CHLORIDE 0.9% IV SOLUTION: Freq: Once | INTRAVENOUS | Status: DC

## 2017-12-05 MED ORDER — OXYCODONE-ACETAMINOPHEN 5-325 MG PO TABS
2.0000 | ORAL_TABLET | Freq: Once | ORAL | Status: AC
  Administered 2017-12-05: 2 via ORAL
  Filled 2017-12-05: qty 2

## 2017-12-05 MED ORDER — METOPROLOL TARTRATE 5 MG/5ML IV SOLN
5.0000 mg | INTRAVENOUS | Status: DC | PRN
  Administered 2017-12-05 – 2017-12-06 (×3): 5 mg via INTRAVENOUS
  Filled 2017-12-05 (×3): qty 5

## 2017-12-05 MED ORDER — ALTEPLASE 2 MG IJ SOLR
2.0000 mg | Freq: Once | INTRAMUSCULAR | Status: DC | PRN
  Filled 2017-12-05: qty 2

## 2017-12-05 MED ORDER — CEPHALEXIN 500 MG PO CAPS
500.0000 mg | ORAL_CAPSULE | Freq: Two times a day (BID) | ORAL | Status: DC
  Administered 2017-12-05 (×2): 500 mg via ORAL
  Filled 2017-12-05 (×2): qty 1

## 2017-12-05 MED ORDER — SODIUM CHLORIDE 0.9 % IV SOLN
2.0000 g | INTRAVENOUS | Status: DC
  Administered 2017-12-05: 2 g via INTRAVENOUS
  Filled 2017-12-05: qty 20
  Filled 2017-12-05: qty 2
  Filled 2017-12-05 (×3): qty 20

## 2017-12-05 MED ORDER — SODIUM CHLORIDE 0.9 % IV SOLN: 250.0000 mL | INTRAVENOUS | Status: DC | PRN

## 2017-12-05 MED ORDER — SODIUM CHLORIDE 0.9% FLUSH
3.0000 mL | Freq: Two times a day (BID) | INTRAVENOUS | Status: DC
  Administered 2017-12-05 – 2017-12-08 (×5): 3 mL via INTRAVENOUS

## 2017-12-05 MED ORDER — SODIUM CHLORIDE 0.9 % IV SOLN
100.0000 mL | INTRAVENOUS | Status: DC | PRN
  Administered 2017-12-07: 100 mL via INTRAVENOUS

## 2017-12-05 MED ORDER — VITAMIN D 1000 UNITS PO TABS
2000.0000 [IU] | ORAL_TABLET | Freq: Every day | ORAL | Status: DC
  Administered 2017-12-05 – 2017-12-08 (×3): 2000 [IU] via ORAL
  Filled 2017-12-05 (×6): qty 2

## 2017-12-05 MED ORDER — HEPARIN SODIUM (PORCINE) 1000 UNIT/ML DIALYSIS
20.0000 [IU]/kg | INTRAMUSCULAR | Status: DC | PRN
  Administered 2017-12-05: 1800 [IU] via INTRAVENOUS_CENTRAL
  Filled 2017-12-05 (×2): qty 2

## 2017-12-05 MED ORDER — EPOETIN ALFA 10000 UNIT/ML IJ SOLN
INTRAMUSCULAR | Status: AC
  Administered 2017-12-05: 10000 [IU] via INTRAVENOUS
  Filled 2017-12-05: qty 1

## 2017-12-05 MED ORDER — WARFARIN SODIUM 5 MG PO TABS: 5.0000 mg | ORAL_TABLET | Freq: Once | ORAL | Status: DC

## 2017-12-05 NOTE — Progress Notes (Signed)
Pt arrived from [facility] via transport. Pt provided chg bath. VSS, pt currently in Afib. Pt currently infusing fresh frozen plasma. Complaining of back pain from fall. Prn given for pain and for heart rate. Pt oriented to room and plan of care reviewed. Will continue to follow.

## 2017-12-05 NOTE — Progress Notes (Addendum)
Aaron [name] transferred to [facility] to room 15 by [facility] via stretcher. Report given to RN from [place]. Family contacted with transfer. Aaron [name] stable, no s/s of distress or complications. Aaron [name] reports pain 10/10, given pain med. Aaron [name] signed blood consent. Aaron [name] left floor with 1 of 2 units of plasma infusing with no s/s of reaction. VS stable.  Aaron [name] belongings sent with Aaron [name] to [facility]. Report called to [facility] RN. Aaron [name] blood consent and hemodialysis consent sent via [facility].

## 2017-12-05 NOTE — Progress Notes (Addendum)
Contacted Dr. Cathlean Sauer today with labs of critical troponin 0.04 and gram - rods.  Patient has c/o neck and back pain and received prn pain medication.  Receiving dialysis now,.  Dialysis nurse noticed left arm bandage bleeding and wound was cleansed with betadine and steristrips applied to supplement in place sutures. ABD,and kerlix appled.

## 2017-12-05 NOTE — Progress Notes (Signed)
At around 1815 patient and large amount of bleeding from distal left arm site that has no sutures. Assisted by several other nurses and tech bandage changed with pressure dressing and sandbag applied.  Contacted Dr. Cathlean Sauer and he ordered vit k IV as well as FFP and to transfer to ICU and then [facility] when bed available.  Vitals stable but still running tachy at times as has been today.  At this time no active bleeding seen through dressing.  Family at bedside,

## 2017-12-05 NOTE — ED Notes (Signed)
Pt temp was 99.24F orally at 0014

## 2017-12-05 NOTE — ED Notes (Signed)
Pt states he is still unable to give [place] a urine sample

## 2017-12-05 NOTE — Progress Notes (Signed)
PROGRESS NOTE    TRESHAWN ALLEN  HYQ:[id number] DOB: [birth year] DOA: 12/04/2017 PCP: Gwenlyn Saran [place]    Brief Narrative:  62 year old male who presented with a syncope episode.  He does have the significant past medical history for end-stage renal disease on hemodialysis, (Tuesday, Thursday and Saturday), COPD, atrial fibrillation and coronary artery disease.  Apparently patient missed his last hemodialysis session on 08/22.  On his way to hemodialysis on 08/23, he had a syncope episode while ambulating, preceded by dizziness and confusion, he hit his left upper extremity, suffering significant bleed from his AV fistula.  He underwent hemodialysis 08/23, and sent home with plan to repeat hemodialysis 08/24.  On the evening of the day of admission patient was found confused by his friend and was transferred to the hospital.  On his initial physical examination his temperature was 99/67, heart rate 75, temperature one 1.3, oxygen saturation 98%, his lungs are clear to auscultation bilaterally, heart S1-S2 present rhythmic, abdomen soft nontender, no lower extremity edema.  Left arm with 2 open wounds at the site of the AV fistula, bloody serous drainage.  Sodium 139, potassium 3.8, chloride 95, bicarb 25, glucose 124, BUN 25, creatinine 2.85, white count 5.4, hemoglobin 6.5, hematocrit 18.8, platelets 108.  EKG with normal sinus rhythm, normal axis and normal intervals.  August 9 patient underwent left basilic vein transposition for hemodialysis access, axillary hematoma which was drained in the operating room.  Patient was admitted to the hospital with working diagnosis of symptomatic anemia due to acute blood loss related to posttraumatic bleeding from his left upper extremity fistula.  Assessment & Plan:   Active Problems:   Syncope   Anemia  1. Acute blood loss anemia due to traumatic injury to left upper extremity fistula. Patient had 2 units of PRBC transfusion, hb and  hct this am at 7,2 and 21.4. Dressing in placed at the AV fistula, has high risk of re-bleeding. Will continue H&H monitoring. If recurrent bleeding will stop warfarin and will need vascular surgery evaluation.   2. ESRD on HD with signs of volume overload. This am with edema, and wheezing, after 2 units prbc, had HD yesterday, but likely will need repeat HD today due to volume overload. Nephrology Dr. Theador Hawthorne has been consulted. Has a right IJ catheter in place. Continue calcitriol and Phoslo. Cholecalciferol and epo.   3. Paroxysmal atrial fibrillation. Continue carvedilol for rate control, anticoagulation with warfarin,  INR is 1,67. Will continue pharmacy protocol for INR monitoring.   4. COPD with no exacerbation. Will add duoneb as needed, continue oxymetry monitoring and supplemental 02 per [place].   5. Left upper extremity cellulitis. Continue cephalexin. Stated as outpatient per vascular surgery.   DVT prophylaxis: on warfarin   Code Status:  full Family Communication: no family at the bedside  Disposition Plan/ discharge barriers:  Pending clinical improvement.    Consultants:   Nephrology   Procedures:     Antimicrobials:       Subjective: This am patient with moderate dyspnea, associated with wheezing, moderate in intensity, with no improving or worsening factors.   Objective: Vitals:   12/05/17 0500 12/05/17 0848 12/05/17 0856 12/05/17 0945  BP: 115/76 (!) 170/79 (!) 165/123   Pulse: 61 90 88   Resp: 18 18 (!) 28   Temp: 98.7 F (37.1 C) 98.7 F (37.1 C) 98.7 F (37.1 C)   TempSrc: Oral Oral Oral   SpO2: 100% (!) 83% 100% 100%  Weight:  Height:        Intake/Output Summary (Last 24 hours) at 12/05/2017 0950 Last data filed at 12/05/2017 0845 Gross per 24 hour  Intake 1247.5 ml  Output -  Net 1247.5 ml   Filed Weights   12/05/17 0129  Weight: 90.8 kg    Examination:   General: Not in pain or dyspnea, deconditioned  Neurology: Awake and alert,  mild confusion but non focal/ positive spontaneous jerking movements.   E ENT: mild pallor, no icterus, oral mucosa moist Cardiovascular: No JVD. S1-S2 present, rhythmic, no gallops, rubs, or murmurs. ++ pitting lower extremity edema. Pulmonary: positive breath sounds bilaterally, adequate air movement, mild expiratory wheezing, but no rhonchi or rales. Gastrointestinal. Abdomen with no organomegaly, non tender, no rebound or guarding Skin. No rashes/ left upper extremity with edema/ dressing in place on left AV fistula, mild blood on dressing, no significant erythema, tenderness or increased local temperature.  Musculoskeletal: no joint deformities  Right IJ tunneled HD catheter Left upper extremity AV fistula    Data Reviewed: I have personally reviewed following labs and imaging studies  CBC: Recent Labs  Lab 12/01/17 1412 12/02/17 1443 12/04/17 2205 12/05/17 0503  WBC 6.9 8.5 5.4 3.4*  NEUTROABS 5.5  --  4.7  --   HGB 8.8* 7.9* 6.5* 7.2*  HCT 27.0* 24.5* 18.8* 21.4*  MCV 100.7* 100.8* 95.4 96.8  PLT 221 155 108* 96*   Basic Metabolic Panel: Recent Labs  Lab 12/01/17 1412 12/02/17 1443 12/04/17 2205 12/05/17 0503  NA 135 130* 129* 130*  K 4.7 4.9 3.8 3.9  CL 105 101 95* 97*  CO2 23 22 25 27   GLUCOSE 131* 102* 124* 128*  BUN 44* 39* 25* 32*  CREATININE 5.01* 4.49* 2.85* 3.15*  CALCIUM 8.3* 8.0* 6.8* 6.8*   GFR: Estimated Creatinine Clearance: 29.4 mL/min (A) (by C-G formula based on SCr of 3.15 mg/dL (H)). Liver Function Tests: Recent Labs  Lab 12/04/17 2205 12/05/17 0503  AST 46* 42*  ALT 10 12  ALKPHOS 86 89  BILITOT 0.6 0.5  PROT 5.0* 5.2*  ALBUMIN 2.2* 2.2*   No results for input(s): LIPASE, AMYLASE in the last 168 hours. No results for input(s): AMMONIA in the last 168 hours. Coagulation Profile: Recent Labs  Lab 12/04/17 2205  INR 1.67   Cardiac Enzymes: Recent Labs  Lab 12/05/17 0502  TROPONINI 0.04*   BNP (last 3 results) No results  for input(s): PROBNP in the last 8760 hours. HbA1C: No results for input(s): HGBA1C in the last 72 hours. CBG: Recent Labs  Lab 12/02/17 1447  GLUCAP 86   Lipid Profile: No results for input(s): CHOL, HDL, LDLCALC, TRIG, CHOLHDL, LDLDIRECT in the last 72 hours. Thyroid Function Tests: No results for input(s): TSH, T4TOTAL, FREET4, T3FREE, THYROIDAB in the last 72 hours. Anemia Panel: No results for input(s): VITAMINB12, FOLATE, FERRITIN, TIBC, IRON, RETICCTPCT in the last 72 hours.    Radiology Studies: I have reviewed all of the imaging during this hospital visit personally     Scheduled Meds: . calcitRIOL  0.25 mcg Oral Q T,Th,Sa-HD  . calcium acetate  667 mg Oral TID WC  . carvedilol  25 mg Oral BID  . cephALEXin  500 mg Oral Q12H  . Chlorhexidine Gluconate Cloth  6 each Topical Q0600  . cholecalciferol  2,000 Units Oral Daily  . epoetin (EPOGEN/PROCRIT) injection  10,000 Units Intravenous Q T,Th,Sa-HD  . ipratropium-albuterol  3 mL Nebulization Q6H  . multivitamin  1 tablet Oral  Daily  . pantoprazole  40 mg Oral Daily  . sodium chloride flush  3 mL Intravenous Q12H  . warfarin  5 mg Oral Once  . Warfarin - Pharmacist Dosing Inpatient   Does not apply q1800   Continuous Infusions: . sodium chloride       LOS: 0 days        Tawni Millers, MD [facility] Pager [phone]

## 2017-12-05 NOTE — H&P (Addendum)
TRH H&P    Patient Demographics:    Aaron [name], is a 62 y.o. male  MRN: [id number]  DOB - 29-Nov-1955  Admit Date - 12/04/2017  Referring MD/NP/PA: Dr. Eulis Foster  Outpatient Primary MD for the patient is [facility]  Patient coming from: Home  Chief complaint-passed out   HPI:    Aaron [name]  is a 62 y.o. male, with history of atrial fibrillation on Coumadin, CAD, COPD, gastroesophageal reflux, ESRD on hemodialysis, peripheral arterial disease was brought to hospital after patient had episode of fall.  Patient says that he passed out.  Patient is a poor historian, history obtained from the ED notes.  Falling episode occurred early morning prior to dialysis.  Patient was found confused by his friend later tonight and he called ambulance.  Patient states he injured his left arm when he fell tonight.  He had surgery on left arm to place a fistula 2 weeks ago.  He subsequently had a hematoma drained from the left axilla on 11/23/2017.  He was seen by vascular surgeon 5 days ago and was started on Keflex for possible infected hematoma. He denies chest pain or shortness of breath. Denies nausea vomiting or diarrhea. Denies abdominal pain. Denies dysuria. In the ED lab work revealed hemoglobin of 6.5, previous hemoglobin 4 days ago was 7.9     Review of systems:     All other systems reviewed and are negative.   With Past History of the following :    Past Medical History:  Diagnosis Date  . Anxiety   . Arthritis    knees , back , shoulders (10/12/2017)  . Atrial fibrillation ([place])   . CAD (coronary artery disease)    Mild nonobstructive disease at cardiac catheterization 2002  . Chronic back pain    "back of my neck; down thru my legs" (10/12/2017)  . COPD (chronic obstructive pulmonary disease) ([place])   . Diverticulitis   . Esophageal reflux   . ESRD (end stage renal disease) on  dialysis [facility])    "TTS; [place]" (11/23/2017)  . Essential hypertension   . Hepatitis C    states he no longer has this  . History of kidney stones   . History of syncope   . Hyperlipidemia   . Myocardial infarction ([place]) 02/2017   "light one" (10/12/2017)  . PAT (paroxysmal atrial tachycardia) ([place])   . Peripheral arterial disease ([place])    Occluded left superficial femoral artery status post stent June 2016 - Dr. Trula Slade  . Pneumonia 1961  . Polycystic kidney, unspecified type   . Syncope 09/2014      Past Surgical History:  Procedure Laterality Date  . ABDOMINAL AORTOGRAM N/A 08/11/2017   Procedure: ABDOMINAL AORTOGRAM;  Surgeon: Serafina Mitchell, MD;  Location: [place] [facility];  Service: Cardiovascular;  Laterality: N/A;  . ANTERIOR CERVICAL DECOMP/DISCECTOMY FUSION    . APPLICATION OF WOUND VAC Right 08/14/2017   Procedure: APPLICATION OF PREVENA INCISIONAL WOUND VAC RIGHT GROIN;  Surgeon: Serafina Mitchell, MD;  Location: [facility]  OR;  Service: Vascular;  Laterality: Right;  . AV FISTULA PLACEMENT Left 09/04/2017   Procedure: ARTERIOVENOUS (AV) FISTULA CREATION LEFT UPPER ARM;  Surgeon: Serafina Mitchell, MD;  Location: [place];  Service: Vascular;  Laterality: Left;  . BACK SURGERY    . BASCILIC VEIN TRANSPOSITION Left 11/20/2017   Procedure: SECOND STAGE BASILIC VEIN TRANSPOSITION LEFT ARM;  Surgeon: Serafina Mitchell, MD;  Location: [place];  Service: Vascular;  Laterality: Left;  . BIOPSY  12/17/2016   Procedure: BIOPSY;  Surgeon: Daneil Dolin, MD;  [facility];  Service: Gastroenterology;;  gastric colon  . BIOPSY  04/29/2017   Procedure: BIOPSY;  Surgeon: Daneil Dolin, MD;  [facility];  Service: Endoscopy;;  duodenal biopsies  . BUNIONECTOMY Bilateral   . COLONOSCOPY  2008   Dr. Oneida Alar: rare sigmoid colon diverticulosis, internal hemorrhoids.   . COLONOSCOPY WITH PROPOFOL N/A 12/17/2016   dense left-sided diverticulosis, right colon ulcers s/p biopsy query occult  NSAID use vs transient ischemia, not consistent with IBD. CMV stains negative.   Marland Kitchen ENDARTERECTOMY FEMORAL Right 08/14/2017   Procedure: RIGHT ILLIO-FEMORAL ENDARTERECTOMY;  Surgeon: Serafina Mitchell, MD;  Location: [facility] OR;  Service: Vascular;  Laterality: Right;  . ESOPHAGEAL DILATION  12/17/2016   EGD with mild Schatzki's ring s/p dilatation, small hiatal hernia, erosive gastropathy (negative H.pylori gastritis)  . ESOPHAGOGASTRODUODENOSCOPY  2008   Dr. Oneida Alar: normal esophagus without Barrett's, antritis and duodenitis, path with H.pylori gastritis  . ESOPHAGOGASTRODUODENOSCOPY (EGD) WITH PROPOFOL N/A 12/17/2016   Procedure: ESOPHAGOGASTRODUODENOSCOPY (EGD) WITH PROPOFOL;  Surgeon: Daneil Dolin, MD;  [facility];  Service: Gastroenterology;  Laterality: N/A;  . ESOPHAGOGASTRODUODENOSCOPY (EGD) WITH PROPOFOL N/A 04/29/2017   Patchy erythema of gastric mucosa diffusely, extensive inflammatory changes in duodenum, geographic ulceration and mucosal edema present, encroaching somewhat on the lumen yet still widely patent, distal second portion of duodenum appeared abnormal, path with peptic duodenitis with ulceration  . GROIN DEBRIDEMENT Right 10/14/2017   Procedure: RIGHT GROIN AND RIGHT LOWER QUADRANT ABDOMEN DEBRIDEMENT WITH PLACEMENT OF ANTIBIOTIC BEADS;  Surgeon: Serafina Mitchell, MD;  Location: [place];  Service: Vascular;  Laterality: Right;  . HERNIA REPAIR  [id number]   umbilical  . I&D EXTREMITY Right 09/21/2017   Procedure: IRRIGATION AND DEBRIDEMENT GROIN;  Surgeon: Elam Dutch, MD;  Location: [facility] OR;  Service: Vascular;  Laterality: Right;  . I&D EXTREMITY Left 11/23/2017   Procedure: Evacuation Hematoma LEFT UPPER ARM GRAFT;  Surgeon: Elam Dutch, MD;  Location: [place];  Service: Vascular;  Laterality: Left;  . INGUINAL HERNIA REPAIR Right 02/2017   [place]  . INSERTION OF DIALYSIS CATHETER Right 09/04/2017   Procedure: INSERTION OF TUNNELED  DIALYSIS CATHETER - RIGHT INTERNAL  JUGULAR PLACEMENT;  Surgeon: Serafina Mitchell, MD;  Location: [place];  Service: Vascular;  Laterality: Right;  . INSERTION OF ILIAC STENT Right 08/14/2017   Procedure: INSERTION OF RIGHT COMMON ILIAC STENT 64mm x 50mm x 130cm INSERTION OF RIGHT EXTERNAL ILIAC STENT 54mm x 52mm x 130cm INSERTION OF SUPERFICIAL FERMORAL ARTERY STENT 37mm x 53mm x 130cm;  Surgeon: Serafina Mitchell, MD;  Location: [facility] OR;  Service: Vascular;  Laterality: Right;  . IR FLUORO GUIDE CV LINE RIGHT  08/31/2017  . IR US GUIDE VASC ACCESS RIGHT  08/31/2017  . LOWER EXTREMITY ANGIOGRAPHY Right 08/11/2017   Procedure: LOWER EXTREMITY ANGIOGRAPHY;  Surgeon: Serafina Mitchell, MD;  Location: [place] [facility];  Service:  Cardiovascular;  Laterality: Right;  . PATCH ANGIOPLASTY Right 08/14/2017   Procedure: PATCH ANGIOPLASTY USING HEMASHIELD PATCH 0.3IN Lillie Columbia;  Surgeon: Serafina Mitchell, MD;  Location: [facility] OR;  Service: Vascular;  Laterality: Right;  . PERIPHERAL VASCULAR CATHETERIZATION N/A 09/20/2014   Procedure: Abdominal Aortogram;  Surgeon: Serafina Mitchell, MD;  Location: [place] [facility];  Service: Cardiovascular;  Laterality: N/A;  . POSTERIOR FUSION LUMBAR SPINE        Social History:      Social History   Tobacco Use  . Smoking status: Former Smoker    Packs/day: 2.00    Years: 47.00    Pack years: 94.00    Types: Cigarettes    Last attempt to quit: 08/03/2017    Years since quitting: 0.3  . Smokeless tobacco: Never Used  Substance Use Topics  . Alcohol use: Not Currently    Comment: 10/12/2017 alcohol free since 2017,  heavy drinker in the past       Family History :     Family History  Problem Relation Age of Onset  . Alcoholism Mother   . Heart disease Father        Massive heart attack  . Heart attack Father   . Atrial fibrillation Father   . Colon cancer Father   . Colon cancer Maternal Grandfather 65  . Alcoholism Maternal Grandfather   . Renal cancer Cousin   . Ovarian cancer Sister       Home  Medications:   Prior to Admission medications   Medication Sig Start Date End Date Taking? Authorizing Provider  B Complex-C-Folic Acid (DIALYVITE 176) 0.8 MG TABS Take 1 tablet by mouth daily. 09/15/17  Yes [provider]  BREO ELLIPTA 200-25 MCG/INH AEPB Inhale 1 puff into the lungs daily as needed (wheezing).  07/27/17  Yes [provider]  calcitRIOL (ROCALTROL) 0.25 MCG capsule Take 1 capsule (0.25 mcg total) by mouth Every Tuesday,Thursday,and Saturday with dialysis. 09/08/17  Yes Ghimire, Henreitta Leber, MD  calcium acetate (PHOSLO) 667 MG capsule Take 667 mg by mouth 3 (three) times daily with meals.  09/15/17  Yes [provider]  carvedilol (COREG) 25 MG tablet Take 1 tablet (25 mg total) 2 (two) times daily by mouth. 02/25/17 12/04/17 Yes Imogene Burn, PA-C  cholecalciferol (VITAMIN D) 1000 units tablet Take 2,000 Units by mouth daily.   Yes [provider]  oxyCODONE-acetaminophen (PERCOCET/ROXICET) 5-325 MG tablet Take 1 tablet by mouth every 4 (four) hours as needed for severe pain. Patient taking differently: Take 2 tablets by mouth every 4 (four) hours as needed for severe pain.  11/30/17  Yes Serafina Mitchell, MD  pantoprazole (PROTONIX) 40 MG tablet Take 1 tablet (40 mg total) by mouth 2 (two) times daily. 04/30/17 12/04/17 Yes Shah, Pratik D, DO  warfarin (COUMADIN) 5 MG tablet Take 1 tablet (5 mg total) by mouth daily. Patient taking differently: Take 2.5-5 mg by mouth See admin instructions. 2.5mg  on Friday and 5mg  on all other days 10/21/17 10/21/18 Yes Rhyne, Hulen Shouts, PA-C  cephALEXin (KEFLEX) 500 MG capsule Take 1 capsule (500 mg total) by mouth 3 (three) times daily for 7 days. 11/30/17 12/07/17  Serafina Mitchell, MD  Nutritional Supplements (FEEDING SUPPLEMENT, NEPRO CARB STEADY,) LIQD Take 237 mLs by mouth 2 (two) times daily between meals. Patient not taking: Reported on 12/02/2017 10/21/17   Gabriel Earing, PA-C     Allergies:    No Known  Allergies  Physical Exam:   Vitals  Blood pressure 99/67, pulse 75, temperature (!) 101.3 F (38.5 C), temperature source Rectal, resp. rate 17, SpO2 98 %.  1.  General: Appears in no acute distress  2. Psychiatric:  Intact judgement and  insight, awake alert, oriented x 3.  3. Neurologic: No focal neurological deficits, all cranial nerves intact.Strength 5/5 all 4 extremities, sensation intact all 4 extremities, plantars down going.  4. Eyes :  anicteric sclerae, moist conjunctivae with no lid lag. PERRLA.  5. ENMT:  Oropharynx clear with moist mucous membranes and good dentition  6. Neck:  supple, no cervical lymphadenopathy appriciated, No thyromegaly  7. Respiratory : Normal respiratory effort, good air movement bilaterally,clear to  auscultation bilaterally  8. Cardiovascular : RRR, no gallops, rubs or murmurs, no leg edema  9. Gastrointestinal:  Positive bowel sounds, abdomen soft, non-tender to palpation,no hepatosplenomegaly, no rigidity or guarding       10. Skin:  Left upper arm has sutures, 2 open wounds noted with bloody/serous discharge.  No erythema noted on the skin around the sutures,   11.Musculoskeletal:  Good muscle tone,  joints appear normal , no effusions,  normal range of motion    Data Review:    CBC Recent Labs  Lab 12/01/17 1412 12/02/17 1443 12/04/17 2205  WBC 6.9 8.5 5.4  HGB 8.8* 7.9* 6.5*  HCT 27.0* 24.5* 18.8*  PLT 221 155 108*  MCV 100.7* 100.8* 95.4  MCH 32.8 32.5 33.0  MCHC 32.6 32.2 34.6  RDW 16.2* 16.0* 16.2*  LYMPHSABS 0.8  --  0.3*  MONOABS 0.5  --  0.4  EOSABS 0.1  --  0.0  BASOSABS 0.0  --  0.0   ------------------------------------------------------------------------------------------------------------------  Chemistries  Recent Labs  Lab 12/01/17 1412 12/02/17 1443 12/04/17 2205  NA 135 130* 129*  K 4.7 4.9 3.8  CL 105 101 95*  CO2 23 22 25   GLUCOSE 131* 102* 124*  BUN 44* 39* 25*  CREATININE  5.01* 4.49* 2.85*  CALCIUM 8.3* 8.0* 6.8*  AST  --   --  46*  ALT  --   --  10  ALKPHOS  --   --  86  BILITOT  --   --  0.6   ------------------------------------------------------------------------------------------------------------------  ------------------------------------------------------------------------------------------------------------------ GFR: Estimated Creatinine Clearance: 33 mL/min (A) (by C-G formula based on SCr of 2.85 mg/dL (H)). Liver Function Tests: Recent Labs  Lab 12/04/17 2205  AST 46*  ALT 10  ALKPHOS 86  BILITOT 0.6  PROT 5.0*  ALBUMIN 2.2*   No results for input(s): LIPASE, AMYLASE in the last 168 hours. No results for input(s): AMMONIA in the last 168 hours. Coagulation Profile: Recent Labs  Lab 12/04/17 2205  INR 1.67   Cardiac Enzymes: No results for input(s): CKTOTAL, CKMB, CKMBINDEX, TROPONINI in the last 168 hours. BNP (last 3 results) No results for input(s): PROBNP in the last 8760 hours. HbA1C: No results for input(s): HGBA1C in the last 72 hours. CBG: Recent Labs  Lab 12/02/17 1447  GLUCAP 86   Lipid Profile: No results for input(s): CHOL, HDL, LDLCALC, TRIG, CHOLHDL, LDLDIRECT in the last 72 hours. Thyroid Function Tests: No results for input(s): TSH, T4TOTAL, FREET4, T3FREE, THYROIDAB in the last 72 hours. Anemia Panel: No results for input(s): VITAMINB12, FOLATE, FERRITIN, TIBC, IRON, RETICCTPCT in the last 72 hours.  --------------------------------------------------------------------------------------------------------------- Urine analysis:    Component Value Date/Time   COLORURINE YELLOW 09/19/2017 0701   APPEARANCEUR CLEAR 09/19/2017 0701   LABSPEC 1.008 09/19/2017  0701   PHURINE 6.0 09/19/2017 0701   GLUCOSEU NEGATIVE 09/19/2017 0701   HGBUR SMALL (A) 09/19/2017 0701   BILIRUBINUR NEGATIVE 09/19/2017 0701   KETONESUR NEGATIVE 09/19/2017 0701   PROTEINUR 30 (A) 09/19/2017 0701   UROBILINOGEN 0.2 03/03/2010  1056   NITRITE NEGATIVE 09/19/2017 0701   LEUKOCYTESUR NEGATIVE 09/19/2017 0701      Imaging Results:    Dg Chest 2 View  Result Date: 12/04/2017 CLINICAL DATA:  Altered mentation and fever tonight after missing 2 dialysis treatments. Former smoker. EXAM: CHEST - 2 VIEW COMPARISON:  12/02/2017 FINDINGS: Dual lumen dialysis catheter tip terminates in the distal SVC. Heart size is top-normal. There is mild aortic atherosclerosis without aneurysm. Hyperinflated lungs with bibasilar atelectasis. No confluent airspace disease. Slight increase in interstitial prominence may reflect mild interstitial edema. No effusion or pneumothorax. IMPRESSION: Slight increase in interstitial prominence since prior with mild central vascular congestion. Findings may reflect mild interstitial edema. Otherwise negative. Electronically Signed   By: Ashley Royalty M.D.   On: 12/04/2017 22:19    My personal review of EKG: Rhythm NSR   Assessment & Plan:    Active Problems:   Anemia   1. Anemia-patient's hemoglobin is 6.5, 2 units PRBC has been ordered in the ED.  Anemia is likely due to anemia of chronic disease.  Follow CBC in a.m. 2. Fall/ syncope- likely due to above, EKG shows normal sinus rhythm.  Will monitor on telemetry.  Cycle troponin every 6 hours x3. 3. Fever-patient had temperature 101.6 in the ED, left upper arm does not appear to be infected but he was prescribed Keflex for infected hematoma by vascular surgeon 5 days ago.  Will continue with Keflex 500 mg p.o. twice daily. 4. ESRD on hemodialysis-patient is on hemodialysis, will consult nephrology in a.m. 5. Atrial fibrillation-patient is on anticoagulation with Coumadin, consult pharmacy for dosing of Coumadin.  Heart rate is currently controlled.  Continue Coreg.    DVT Prophylaxis-Coumadin  AM Labs Ordered, also please review Full Orders  Family Communication: Admission, patients condition and plan of care including tests being ordered have  been discussed with the patient * who indicate understanding and agree with the plan and Code Status.  Code Status: Full code  Admission status: Observation  Time spent in minutes : 60 minutes   Oswald Hillock M.D on 12/05/2017 at 12:45 AM  Between 7am to 7pm - Pager - [phone]. After 7pm go to [email or web address] - password [email or web address]  [facility] - Office  [phone]

## 2017-12-05 NOTE — Progress Notes (Signed)
Report called to CCU.  Assessed patient arm and no bleeding seen through dressing and cna cleaning patient for transfer

## 2017-12-05 NOTE — Progress Notes (Addendum)
Patient with recurrent bleeding from AV fistula, will apply compression dressing, order 2 units of FFP and 5 mg of IV vitamin K. Transfer to ICU. Called vascular surgery for consult, Dr Donzetta Matters. Patient will be transfer to [facility].

## 2017-12-05 NOTE — Consult Note (Signed)
Aaron [name] MRN: [id number] DOB/AGE: 61-Aug-1957 61 y.o. Primary Care Physician:[facility] [place] date: 12/04/2017 Chief Complaint:  Chief Complaint  Patient presents with  . Altered Mental Status  . Fever   HPI: Pt  is a 62 year old caucasian male with past medical history of atrial fibrillation on Coumadin,ESRD on hemodialysis, peripheral arterial disease who was brought to hospital after patient had episode of fall.    HPI dates back to yesterday when pt had a fall. The  fall  occurred early morning prior to dialysis.  Patient was found confused by his friend later lastnight and 911 was called.   Patient also c/o   injuring his left arm when he fell . Pt has had surgery on left arm to place a fistula 2 weeks ago.  Later pt  had a hematoma which was drained from the left axilla on 11/23/2017.  Pt was seen by vascular surgeon 5 days ago and was started on Keflex for possible infected hematoma. Upon evaluation in ER pt was found to be febrile and anemic with  hemoglobin of 6.5.Pt was admitted for further tx He denies chest pain or shortness of breath. Denies nausea vomiting or diarrhea. Denies abdominal pain. Denies dysuria.  Past Medical History:  Diagnosis Date  . Anxiety   . Arthritis    knees , back , shoulders (10/12/2017)  . Atrial fibrillation ([place])   . CAD (coronary artery disease)    Mild nonobstructive disease at cardiac catheterization 2002  . Chronic back pain    "back of my neck; down thru my legs" (10/12/2017)  . COPD (chronic obstructive pulmonary disease) ([place])   . Diverticulitis   . Esophageal reflux   . ESRD (end stage renal disease) on dialysis [facility])    "TTS; [facility]" (11/23/2017)  . Essential hypertension   . Hepatitis C    states he no longer has this  . History of kidney stones   . History of syncope   . Hyperlipidemia   . Myocardial infarction () 02/2017   "light one" (10/12/2017)  . PAT (paroxysmal atrial tachycardia) ([place])   .  Peripheral arterial disease ([place])    Occluded left superficial femoral artery status post stent June 2016 - Dr. Trula Slade  . Pneumonia 1961  . Polycystic kidney, unspecified type   . Syncope 09/2014        Family History  Problem Relation Age of Onset  . Alcoholism Mother   . Heart disease Father        Massive heart attack  . Heart attack Father   . Atrial fibrillation Father   . Colon cancer Father   . Colon cancer Maternal Grandfather 41  . Alcoholism Maternal Grandfather   . Renal cancer Cousin   . Ovarian cancer Sister     Social History:  reports that he quit smoking about 4 months ago. His smoking use included cigarettes. He has a 94.00 pack-year smoking history. He has never used smokeless tobacco. He reports that he drank alcohol. He reports that he has current or past drug history.   Allergies: No Known Allergies  Medications Prior to Admission  Medication Sig Dispense Refill  . B Complex-C-Folic Acid (DIALYVITE 093) 0.8 MG TABS Take 1 tablet by mouth daily.  3  . BREO ELLIPTA 200-25 MCG/INH AEPB Inhale 1 puff into the lungs daily as needed (wheezing).     . calcitRIOL (ROCALTROL) 0.25 MCG capsule Take 1 capsule (0.25 mcg total) by mouth Every Tuesday,Thursday,and Saturday  with dialysis. 30 capsule 0  . calcium acetate (PHOSLO) 667 MG capsule Take 667 mg by mouth 3 (three) times daily with meals.   3  . carvedilol (COREG) 25 MG tablet Take 1 tablet (25 mg total) 2 (two) times daily by mouth. 180 tablet 3  . cholecalciferol (VITAMIN D) 1000 units tablet Take 2,000 Units by mouth daily.    Marland Kitchen oxyCODONE-acetaminophen (PERCOCET/ROXICET) 5-325 MG tablet Take 1 tablet by mouth every 4 (four) hours as needed for severe pain. (Patient taking differently: Take 2 tablets by mouth every 4 (four) hours as needed for severe pain. ) 15 tablet 0  . pantoprazole (PROTONIX) 40 MG tablet Take 1 tablet (40 mg total) by mouth 2 (two) times daily. 60 tablet 1  . warfarin (COUMADIN) 5 MG  tablet Take 1 tablet (5 mg total) by mouth daily. (Patient taking differently: Take 2.5-5 mg by mouth See admin instructions. 2.5mg  on Friday and 5mg  on all other days)    . cephALEXin (KEFLEX) 500 MG capsule Take 1 capsule (500 mg total) by mouth 3 (three) times daily for 7 days. 21 capsule 0  . Nutritional Supplements (FEEDING SUPPLEMENT, NEPRO CARB STEADY,) LIQD Take 237 mLs by mouth 2 (two) times daily between meals. (Patient not taking: Reported on 12/02/2017)  0       NGE:XBMWU from the symptoms mentioned above,there are no other symptoms referable to all systems reviewed.  . calcitRIOL  0.25 mcg Oral Q T,Th,Sa-HD  . calcium acetate  667 mg Oral TID WC  . carvedilol  25 mg Oral BID  . cephALEXin  500 mg Oral Q12H  . Chlorhexidine Gluconate Cloth  6 each Topical Q0600  . cholecalciferol  2,000 Units Oral Daily  . epoetin alfa      . epoetin (EPOGEN/PROCRIT) injection  10,000 Units Intravenous Q T,Th,Sa-HD  . heparin      . ipratropium-albuterol  3 mL Nebulization Q6H  . multivitamin  1 tablet Oral Daily  . pantoprazole  40 mg Oral Daily  . sodium chloride flush  3 mL Intravenous Q12H  . warfarin  5 mg Oral Once  . Warfarin - Pharmacist Dosing Inpatient   Does not apply q1800      Physical Exam: Vital signs in last 24 hours: Temp:  [97.3 F (36.3 C)-101.3 F (38.5 C)] 98.7 F (37.1 C) (08/24 0856) Pulse Rate:  [61-90] 82 (08/24 1013) Resp:  [14-28] 20 (08/24 1013) BP: (79-170)/(53-123) 114/68 (08/24 1013) SpO2:  [83 %-100 %] 97 % (08/24 1013) Weight:  [90.8 kg] 90.8 kg (08/24 0129) Weight change:  Last BM Date: 12/04/17  Intake/Output from previous day: 08/23 0701 - 08/24 0700 In: 43 [Blood:285; IV Piggyback:500] Out: -  Total I/O In: 462.5 [Blood:462.5] Out: -    Physical Exam: General- pt is awake,follows coammnds Resp- No acute REsp distress, CTA B/L NO Rhonchi CVS- S1S2 regular in rate and rhythm GIT- BS+, soft, NT, ND EXT- 1+ LE Edema, Cyanosis CNS-  CN 2-12 grossly intact. Moving all 4 extremities Psych- normal mod and affect Access- PC + AVF Left arm- oozing wound present ( redressed during HD)  Lab Results: CBC Recent Labs    12/04/17 2205 12/05/17 0503  WBC 5.4 3.4*  HGB 6.5* 7.2*  HCT 18.8* 21.4*  PLT 108* 96*    BMET Recent Labs    12/04/17 2205 12/05/17 0503  NA 129* 130*  K 3.8 3.9  CL 95* 97*  CO2 25 27  GLUCOSE 124* 128*  BUN  25* 32*  CREATININE 2.85* 3.15*  CALCIUM 6.8* 6.8*    MICRO Recent Results (from the past 240 hour(s))  Culture, blood (Routine x 2)     Status: None (Preliminary result)   Collection Time: 12/04/17  9:41 PM  Result Value Ref Range Status   Specimen Description BLOOD RIGHT FOREARM  Final   Special Requests   Final    BOTTLES DRAWN AEROBIC AND ANAEROBIC Blood Culture adequate volume   Culture   Final    NO GROWTH < 12 HOURS Performed at [facility], [address] [place], [place] [address]    Report Status PENDING  Incomplete  Culture, blood (Routine x 2)     Status: None (Preliminary result)   Collection Time: 12/04/17 10:06 PM  Result Value Ref Range Status   Specimen Description BLOOD RIGHT HAND  Final   Special Requests   Final    BOTTLES DRAWN AEROBIC AND ANAEROBIC Blood Culture adequate volume   Culture   Final    NO GROWTH < 12 HOURS Performed at [facility], [address] [place],  [address]    Report Status PENDING  Incomplete      Lab Results  Component Value Date   PTH 269 (H) 08/31/2017   CALCIUM 6.8 (L) 12/05/2017   CAION 1.15 09/20/2014   PHOS 4.9 (H) 10/20/2017      Impression: 1)Renal  ESRD on HD               Hx of NON adhence to tx               Pt in recent past missed nearly 2 weeks of tx               wil dialyze pt today  2)HTN  Medication- On Alpha and beta Blockers  3)Anemia HGb not at goal. In ESRD the goal is 9-11 while on ESA Pt  Received 2 units PRBC  4)CKD Mineral-Bone Disorder PTH acceptable. Secondary  Hyperparathyroidism  present . Phosphorus at goal. Calcium is low  5)ID- recently dx with left arm cellulitis With pt having PC in situ Will ask for blood cultures Will give 1 dose of vanco + genta  PMD following  6)Electrolytes  Normokalemic  Hyponatremic   Hypervolemic + ESRD   7)Acid base Co2 at goal     Plan:  Will dialyze today Will use 3 k bath Will use lower NA bath Will await cultures results  Addendum Pt seen on Hd Pt tolerating tx well.   Sidni Fusco S 12/05/2017, 12:03 PM

## 2017-12-05 NOTE — Progress Notes (Signed)
Tele called with report of afib.  Notified Dr. Cathlean Sauer and received order for prn Metoprolol

## 2017-12-05 NOTE — Progress Notes (Signed)
Blood cultures with gram negative rods, will change cephalexin to IV ceftriaxone and continue close monitoring.

## 2017-12-05 NOTE — Progress Notes (Signed)
Reinforced dressing at this time.

## 2017-12-05 NOTE — Procedures (Signed)
    HEMODIALYSIS TREATMENT NOTE:   4 hour dialysis session completed via right IJ tunneled catheter.  Exit site unremarkable.  Goal met: 2 liters removed without interruption in ultrafiltration.  All blood was reinfused.  BP stable throughout HD session, however pt converted to Afib 93-125 at end of session.  Metoprolol dose was ordered.    Rocephin 2g was given at end of dialysis session for gram negative bacteremia.  Left upper arm dressing was removed at 1300 as 4x4 gauze, abd pad and Kerlex dressing had become saturated with blood.  Axillary incision from hematoma evacuation is approximated with nylon sutures except for a 1-cm area from which blood was slowly oozing.  This area was cleaned with betadine and steri-strips were applied.  Arm was redressed and, thus far, dressing has remained clean and dry.    Rockwell Alexandria, RN, CDN

## 2017-12-05 NOTE — Progress Notes (Signed)
ANTICOAGULATION CONSULT NOTE - Initial Consult  Pharmacy Consult for Coumadin Indication: atrial fibrillation  No Known Allergies  Patient Measurements: Height: 6\' 3"  (190.5 cm) Weight: 200 lb 2.8 oz (90.8 kg) IBW/kg (Calculated) : 84.5  Vital Signs: Temp: 98.7 F (37.1 C) (08/24 0856) Temp Source: Oral (08/24 0856) BP: 165/123 (08/24 0856) Pulse Rate: 88 (08/24 0856)  Labs: Recent Labs    12/02/17 1443 12/04/17 2205 12/05/17 0502 12/05/17 0503  HGB 7.9* 6.5*  --  7.2*  HCT 24.5* 18.8*  --  21.4*  PLT 155 108*  --  96*  LABPROT  --  19.5*  --   --   INR  --  1.67  --   --   CREATININE 4.49* 2.85*  --  3.15*  TROPONINI  --   --  0.04*  --     Estimated Creatinine Clearance: 29.4 mL/min (A) (by C-G formula based on SCr of 3.15 mg/dL (H)).   Medical History: Past Medical History:  Diagnosis Date  . Anxiety   . Arthritis    knees , back , shoulders (10/12/2017)  . Atrial fibrillation ([place])   . CAD (coronary artery disease)    Mild nonobstructive disease at cardiac catheterization 2002  . Chronic back pain    "back of my neck; down thru my legs" (10/12/2017)  . COPD (chronic obstructive pulmonary disease) ([place])   . Diverticulitis   . Esophageal reflux   . ESRD (end stage renal disease) on dialysis [facility])    "TTS; [facility]" (11/23/2017)  . Essential hypertension   . Hepatitis C    states he no longer has this  . History of kidney stones   . History of syncope   . Hyperlipidemia   . Myocardial infarction ([place]) 02/2017   "light one" (10/12/2017)  . PAT (paroxysmal atrial tachycardia) ([place])   . Peripheral arterial disease ([place])    Occluded left superficial femoral artery status post stent June 2016 - Dr. Trula Slade  . Pneumonia 1961  . Polycystic kidney, unspecified type   . Syncope 09/2014    Medications:  Medications Prior to Admission  Medication Sig Dispense Refill Last Dose  . B Complex-C-Folic Acid (DIALYVITE 063) 0.8 MG TABS Take 1 tablet by mouth daily.  3  12/03/2017 at Unknown time  . BREO ELLIPTA 200-25 MCG/INH AEPB Inhale 1 puff into the lungs daily as needed (wheezing).    unknown  . calcitRIOL (ROCALTROL) 0.25 MCG capsule Take 1 capsule (0.25 mcg total) by mouth Every Tuesday,Thursday,and Saturday with dialysis. 30 capsule 0 12/03/2017 at Unknown time  . calcium acetate (PHOSLO) 667 MG capsule Take 667 mg by mouth 3 (three) times daily with meals.   3 12/03/2017 at Unknown time  . carvedilol (COREG) 25 MG tablet Take 1 tablet (25 mg total) 2 (two) times daily by mouth. 180 tablet 3 12/03/2017 at 2030  . cholecalciferol (VITAMIN D) 1000 units tablet Take 2,000 Units by mouth daily.   12/03/2017 at Unknown time  . oxyCODONE-acetaminophen (PERCOCET/ROXICET) 5-325 MG tablet Take 1 tablet by mouth every 4 (four) hours as needed for severe pain. (Patient taking differently: Take 2 tablets by mouth every 4 (four) hours as needed for severe pain. ) 15 tablet 0 12/03/2017 at Unknown time  . pantoprazole (PROTONIX) 40 MG tablet Take 1 tablet (40 mg total) by mouth 2 (two) times daily. 60 tablet 1 12/03/2017 at Unknown time  . warfarin (COUMADIN) 5 MG tablet Take 1 tablet (5 mg total) by mouth daily. (Patient  taking differently: Take 2.5-5 mg by mouth See admin instructions. 2.5mg  on Friday and 5mg  on all other days)   12/03/2017 at 2030  . cephALEXin (KEFLEX) 500 MG capsule Take 1 capsule (500 mg total) by mouth 3 (three) times daily for 7 days. 21 capsule 0 11/30/2017 at Unknown time  . Nutritional Supplements (FEEDING SUPPLEMENT, NEPRO CARB STEADY,) LIQD Take 237 mLs by mouth 2 (two) times daily between meals. (Patient not taking: Reported on 12/02/2017)  0 Not Taking at Unknown time    Assessment: 62 yo male w/ ESRD admitted due to passing out. On chronic Coumadin for Afib He had surgery on left arm to place a fistula 2 weeks ago.  He subsequently had a hematoma drained from the left axilla on 11/23/2017.  He was seen by vascular surgeon 5 days ago and was started on  Keflex for possible infected hematoma.  Hgb in ED 6.5, patient ordered transfusion and  hgb is 7.2 this AM.  INR is sub therapeutic at 1.67. Pharmacy asked to dose. No bleeding noted.  Goal of Therapy:  INR 2-3 Monitor platelets by anticoagulation protocol: Yes   Plan:  Coumadin 5mg  po today Daily PT-INR Monitor for S/S of bleeding  Isac Sarna, BS Vena [place], BCPS Clinical Pharmacist Pager [phone] 12/05/2017,8:59 AM

## 2017-12-06 ENCOUNTER — Observation Stay (HOSPITAL_COMMUNITY): Payer: 59 | Admitting: Anesthesiology

## 2017-12-06 ENCOUNTER — Encounter (HOSPITAL_COMMUNITY)
Admission: EM | Disposition: A | Discharge status: Home Health | Payer: Self-pay | Source: Home / Self Care | Attending: Internal Medicine

## 2017-12-06 ENCOUNTER — Encounter (HOSPITAL_COMMUNITY): Payer: Self-pay | Admitting: Surgery

## 2017-12-06 DIAGNOSIS — Z7951 Long term (current) use of inhaled steroids: Secondary | ICD-10-CM | POA: Diagnosis not present

## 2017-12-06 DIAGNOSIS — I471 Supraventricular tachycardia: Secondary | ICD-10-CM | POA: Diagnosis present

## 2017-12-06 DIAGNOSIS — L03114 Cellulitis of left upper limb: Secondary | ICD-10-CM | POA: Diagnosis present

## 2017-12-06 DIAGNOSIS — N186 End stage renal disease: Secondary | ICD-10-CM | POA: Diagnosis present

## 2017-12-06 DIAGNOSIS — J449 Chronic obstructive pulmonary disease, unspecified: Secondary | ICD-10-CM | POA: Diagnosis present

## 2017-12-06 DIAGNOSIS — D62 Acute posthemorrhagic anemia: Secondary | ICD-10-CM | POA: Diagnosis present

## 2017-12-06 DIAGNOSIS — L0889 Other specified local infections of the skin and subcutaneous tissue: Secondary | ICD-10-CM | POA: Diagnosis not present

## 2017-12-06 DIAGNOSIS — R55 Syncope and collapse: Secondary | ICD-10-CM | POA: Diagnosis not present

## 2017-12-06 DIAGNOSIS — E785 Hyperlipidemia, unspecified: Secondary | ICD-10-CM | POA: Diagnosis present

## 2017-12-06 DIAGNOSIS — I2510 Atherosclerotic heart disease of native coronary artery without angina pectoris: Secondary | ICD-10-CM | POA: Diagnosis present

## 2017-12-06 DIAGNOSIS — N2581 Secondary hyperparathyroidism of renal origin: Secondary | ICD-10-CM | POA: Diagnosis present

## 2017-12-06 DIAGNOSIS — N185 Chronic kidney disease, stage 5: Secondary | ICD-10-CM | POA: Diagnosis not present

## 2017-12-06 DIAGNOSIS — E871 Hypo-osmolality and hyponatremia: Secondary | ICD-10-CM | POA: Diagnosis present

## 2017-12-06 DIAGNOSIS — Z8249 Family history of ischemic heart disease and other diseases of the circulatory system: Secondary | ICD-10-CM | POA: Diagnosis not present

## 2017-12-06 DIAGNOSIS — L988 Other specified disorders of the skin and subcutaneous tissue: Secondary | ICD-10-CM | POA: Diagnosis not present

## 2017-12-06 DIAGNOSIS — I480 Paroxysmal atrial fibrillation: Secondary | ICD-10-CM | POA: Diagnosis present

## 2017-12-06 DIAGNOSIS — B9689 Other specified bacterial agents as the cause of diseases classified elsewhere: Secondary | ICD-10-CM | POA: Diagnosis not present

## 2017-12-06 DIAGNOSIS — K219 Gastro-esophageal reflux disease without esophagitis: Secondary | ICD-10-CM | POA: Diagnosis present

## 2017-12-06 DIAGNOSIS — A498 Other bacterial infections of unspecified site: Secondary | ICD-10-CM | POA: Diagnosis not present

## 2017-12-06 DIAGNOSIS — D696 Thrombocytopenia, unspecified: Secondary | ICD-10-CM | POA: Diagnosis not present

## 2017-12-06 DIAGNOSIS — I252 Old myocardial infarction: Secondary | ICD-10-CM | POA: Diagnosis not present

## 2017-12-06 DIAGNOSIS — Z87442 Personal history of urinary calculi: Secondary | ICD-10-CM | POA: Diagnosis not present

## 2017-12-06 DIAGNOSIS — Z811 Family history of alcohol abuse and dependence: Secondary | ICD-10-CM | POA: Diagnosis not present

## 2017-12-06 DIAGNOSIS — Z800 Family history of malignant neoplasm of digestive organs: Secondary | ICD-10-CM | POA: Diagnosis not present

## 2017-12-06 DIAGNOSIS — M545 Low back pain: Secondary | ICD-10-CM | POA: Diagnosis present

## 2017-12-06 DIAGNOSIS — R7881 Bacteremia: Secondary | ICD-10-CM | POA: Diagnosis not present

## 2017-12-06 DIAGNOSIS — Z8051 Family history of malignant neoplasm of kidney: Secondary | ICD-10-CM | POA: Diagnosis not present

## 2017-12-06 DIAGNOSIS — Z7901 Long term (current) use of anticoagulants: Secondary | ICD-10-CM | POA: Diagnosis not present

## 2017-12-06 DIAGNOSIS — I120 Hypertensive chronic kidney disease with stage 5 chronic kidney disease or end stage renal disease: Secondary | ICD-10-CM | POA: Diagnosis present

## 2017-12-06 DIAGNOSIS — G8929 Other chronic pain: Secondary | ICD-10-CM | POA: Diagnosis present

## 2017-12-06 DIAGNOSIS — R4182 Altered mental status, unspecified: Secondary | ICD-10-CM | POA: Diagnosis present

## 2017-12-06 DIAGNOSIS — T82898A Other specified complication of vascular prosthetic devices, implants and grafts, initial encounter: Secondary | ICD-10-CM

## 2017-12-06 DIAGNOSIS — W19XXXA Unspecified fall, initial encounter: Secondary | ICD-10-CM | POA: Diagnosis present

## 2017-12-06 DIAGNOSIS — Z992 Dependence on renal dialysis: Secondary | ICD-10-CM | POA: Diagnosis not present

## 2017-12-06 HISTORY — PX: HEMATOMA EVACUATION: SHX5118

## 2017-12-06 LAB — CBC WITH DIFFERENTIAL/PLATELET
ABS IMMATURE GRANULOCYTES: 0 10*3/uL (ref 0.0–0.1)
BASOS ABS: 0 10*3/uL (ref 0.0–0.1)
BASOS PCT: 0 %
Basophils Absolute: 0 10*3/uL (ref 0.0–0.1)
Basophils Relative: 0 %
EOS ABS: 0 10*3/uL (ref 0.0–0.7)
EOS PCT: 0 %
Eosinophils Absolute: 0 10*3/uL (ref 0.0–0.7)
Eosinophils Relative: 0 %
HCT: 24.9 % — ABNORMAL LOW (ref 39.0–52.0)
HEMATOCRIT: 22.5 % — AB (ref 39.0–52.0)
HEMOGLOBIN: 8.3 g/dL — AB (ref 13.0–17.0)
HEMOGLOBIN: 7.5 g/dL — AB (ref 13.0–17.0)
Immature Granulocytes: 1 %
LYMPHS ABS: 0.5 10*3/uL — AB (ref 0.7–4.0)
LYMPHS ABS: 0.3 10*3/uL — AB (ref 0.7–4.0)
LYMPHS PCT: 4 %
Lymphocytes Relative: 6 %
MCH: 32 pg (ref 26.0–34.0)
MCH: 32.1 pg (ref 26.0–34.0)
MCHC: 33.3 g/dL (ref 30.0–36.0)
MCHC: 33.3 g/dL (ref 30.0–36.0)
MCV: 96.1 fL (ref 78.0–100.0)
MCV: 96.2 fL (ref 78.0–100.0)
MONO ABS: 0.6 10*3/uL (ref 0.1–1.0)
Monocytes Absolute: 0.3 10*3/uL (ref 0.1–1.0)
Monocytes Relative: 8 %
Monocytes Relative: 5 %
NEUTROS ABS: 5.9 10*3/uL (ref 1.7–7.7)
Neutro Abs: 6.5 10*3/uL (ref 1.7–7.7)
Neutrophils Relative %: 86 %
Neutrophils Relative %: 90 %
PLATELETS: 75 10*3/uL — AB (ref 150–400)
Platelets: 59 10*3/uL — ABNORMAL LOW (ref 150–400)
RBC: 2.59 MIL/uL — ABNORMAL LOW (ref 4.22–5.81)
RBC: 2.34 MIL/uL — AB (ref 4.22–5.81)
RDW: 16.1 % — AB (ref 11.5–15.5)
RDW: 15.9 % — ABNORMAL HIGH (ref 11.5–15.5)
WBC: 7.6 10*3/uL (ref 4.0–10.5)
WBC: 6.5 10*3/uL (ref 4.0–10.5)

## 2017-12-06 LAB — TYPE AND SCREEN
ABO/RH(D): O POS
Antibody Screen: NEGATIVE
Unit division: 0
Unit division: 0

## 2017-12-06 LAB — CBC
HEMATOCRIT: 25.4 % — AB (ref 39.0–52.0)
HEMOGLOBIN: 8.3 g/dL — AB (ref 13.0–17.0)
MCH: 31.9 pg (ref 26.0–34.0)
MCHC: 32.7 g/dL (ref 30.0–36.0)
MCV: 97.7 fL (ref 78.0–100.0)
Platelets: 70 10*3/uL — ABNORMAL LOW (ref 150–400)
RBC: 2.6 MIL/uL — AB (ref 4.22–5.81)
RDW: 16.3 % — ABNORMAL HIGH (ref 11.5–15.5)
WBC: 10 10*3/uL (ref 4.0–10.5)

## 2017-12-06 LAB — BASIC METABOLIC PANEL
Anion gap: 6 (ref 5–15)
BUN: 16 mg/dL (ref 8–23)
CALCIUM: 7.5 mg/dL — AB (ref 8.9–10.3)
CO2: 34 mmol/L — AB (ref 22–32)
CREATININE: 2.4 mg/dL — AB (ref 0.61–1.24)
Chloride: 94 mmol/L — ABNORMAL LOW (ref 98–111)
GFR calc Af Amer: 32 mL/min — ABNORMAL LOW (ref >60)
GFR calc non Af Amer: 28 mL/min — ABNORMAL LOW (ref >60)
Glucose, Bld: 112 mg/dL — ABNORMAL HIGH (ref 70–99)
Potassium: 3.9 mmol/L (ref 3.5–5.1)
SODIUM: 134 mmol/L — AB (ref 135–145)

## 2017-12-06 LAB — PREPARE FRESH FROZEN PLASMA: Unit division: 0

## 2017-12-06 LAB — BPAM RBC
BLOOD PRODUCT EXPIRATION DATE: 201909202359
Blood Product Expiration Date: 201909272359
ISSUE DATE / TIME: 201908240146
ISSUE DATE / TIME: 201908240449
UNIT TYPE AND RH: 5100
UNIT TYPE AND RH: 5100

## 2017-12-06 LAB — BPAM FFP
Blood Product Expiration Date: 201908292359
ISSUE DATE / TIME: 201908242138
Unit Type and Rh: 5100

## 2017-12-06 LAB — PROTIME-INR
INR: 1.2
PROTHROMBIN TIME: 15.1 s (ref 11.4–15.2)

## 2017-12-06 SURGERY — EVACUATION HEMATOMA
Anesthesia: General | Laterality: Left

## 2017-12-06 MED ORDER — SODIUM CHLORIDE 0.9 % IV SOLN
INTRAVENOUS | Status: DC | PRN
  Administered 2017-12-06: 100 ug/min via INTRAVENOUS

## 2017-12-06 MED ORDER — FENTANYL CITRATE (PF) 100 MCG/2ML IJ SOLN
INTRAMUSCULAR | Status: DC | PRN
  Administered 2017-12-06: 50 ug via INTRAVENOUS
  Administered 2017-12-06: 100 ug via INTRAVENOUS

## 2017-12-06 MED ORDER — PHENYLEPHRINE HCL 10 MG/ML IJ SOLN
INTRAMUSCULAR | Status: DC | PRN
  Administered 2017-12-06 (×4): 200 ug via INTRAVENOUS

## 2017-12-06 MED ORDER — LIDOCAINE 2% (20 MG/ML) 5 ML SYRINGE
INTRAMUSCULAR | Status: DC | PRN
  Administered 2017-12-06: 100 mg via INTRAVENOUS

## 2017-12-06 MED ORDER — SODIUM CHLORIDE 0.9 % IV SOLN
INTRAVENOUS | Status: DC | PRN
  Administered 2017-12-06: 10:00:00 via INTRAVENOUS

## 2017-12-06 MED ORDER — PROPOFOL 10 MG/ML IV BOLUS
INTRAVENOUS | Status: DC | PRN
  Administered 2017-12-06: 120 mg via INTRAVENOUS

## 2017-12-06 MED ORDER — IPRATROPIUM-ALBUTEROL 0.5-2.5 (3) MG/3ML IN SOLN
3.0000 mL | Freq: Three times a day (TID) | RESPIRATORY_TRACT | Status: DC
  Administered 2017-12-07 – 2017-12-08 (×5): 3 mL via RESPIRATORY_TRACT
  Filled 2017-12-06 (×5): qty 3

## 2017-12-06 MED ORDER — MORPHINE SULFATE (PF) 2 MG/ML IV SOLN
2.0000 mg | Freq: Once | INTRAVENOUS | Status: AC
  Administered 2017-12-06: 2 mg via INTRAVENOUS
  Filled 2017-12-06 (×2): qty 1

## 2017-12-06 MED ORDER — HYDROMORPHONE HCL 1 MG/ML IJ SOLN: 0.2500 mg | INTRAMUSCULAR | Status: DC | PRN

## 2017-12-06 MED ORDER — SODIUM CHLORIDE 0.9 % IR SOLN
Status: DC | PRN
  Administered 2017-12-06: 1000 mL

## 2017-12-06 MED ORDER — FENTANYL CITRATE (PF) 250 MCG/5ML IJ SOLN
INTRAMUSCULAR | Status: AC
  Filled 2017-12-06: qty 5

## 2017-12-06 MED ORDER — ONDANSETRON HCL 4 MG/2ML IJ SOLN
INTRAMUSCULAR | Status: DC | PRN
  Administered 2017-12-06: 4 mg via INTRAVENOUS

## 2017-12-06 MED ORDER — IPRATROPIUM-ALBUTEROL 0.5-2.5 (3) MG/3ML IN SOLN
3.0000 mL | RESPIRATORY_TRACT | Status: DC | PRN
  Administered 2017-12-06: 3 mL via RESPIRATORY_TRACT
  Filled 2017-12-06: qty 3

## 2017-12-06 MED ORDER — ONDANSETRON HCL 4 MG/2ML IJ SOLN: 4.0000 mg | Freq: Once | INTRAMUSCULAR | Status: DC | PRN

## 2017-12-06 MED ORDER — MEPERIDINE HCL 25 MG/ML IJ SOLN: 6.2500 mg | INTRAMUSCULAR | Status: DC | PRN

## 2017-12-06 MED ORDER — MIDAZOLAM HCL 2 MG/2ML IJ SOLN
INTRAMUSCULAR | Status: AC
  Filled 2017-12-06: qty 2

## 2017-12-06 MED ORDER — CHLORHEXIDINE GLUCONATE CLOTH 2 % EX PADS: 6.0000 | MEDICATED_PAD | Freq: Every day | CUTANEOUS | Status: DC

## 2017-12-06 MED ORDER — ALBUMIN HUMAN 5 % IV SOLN
INTRAVENOUS | Status: DC | PRN
  Administered 2017-12-06 (×2): via INTRAVENOUS

## 2017-12-06 MED ORDER — SODIUM CHLORIDE 0.9% IV SOLUTION
Freq: Once | INTRAVENOUS | Status: AC
  Administered 2017-12-06: 02:00:00 via INTRAVENOUS

## 2017-12-06 MED ORDER — HYDROMORPHONE HCL 1 MG/ML IJ SOLN
0.5000 mg | INTRAMUSCULAR | Status: DC | PRN
  Administered 2017-12-06 – 2017-12-07 (×3): 0.5 mg via INTRAVENOUS
  Filled 2017-12-06 (×3): qty 1

## 2017-12-06 MED ORDER — SUCCINYLCHOLINE CHLORIDE 20 MG/ML IJ SOLN
INTRAMUSCULAR | Status: DC | PRN
  Administered 2017-12-06: 100 mg via INTRAVENOUS

## 2017-12-06 MED ORDER — OXYCODONE-ACETAMINOPHEN 5-325 MG PO TABS
1.0000 | ORAL_TABLET | ORAL | Status: DC | PRN
  Administered 2017-12-07: 1 via ORAL
  Filled 2017-12-06 (×2): qty 1

## 2017-12-06 MED ORDER — MIDAZOLAM HCL 2 MG/2ML IJ SOLN
INTRAMUSCULAR | Status: DC | PRN
  Administered 2017-12-06: 2 mg via INTRAVENOUS

## 2017-12-06 MED ORDER — PROPOFOL 10 MG/ML IV BOLUS
INTRAVENOUS | Status: AC
  Filled 2017-12-06: qty 20

## 2017-12-06 SURGICAL SUPPLY — 49 items
BANDAGE ACE 4X5 VEL STRL LF (GAUZE/BANDAGES/DRESSINGS) ×2 IMPLANT
BANDAGE ESMARK 6X9 LF (GAUZE/BANDAGES/DRESSINGS) IMPLANT
BLADE 11 SAFETY STRL DISP (BLADE) ×2 IMPLANT
BNDG CMPR 9X6 STRL LF SNTH (GAUZE/BANDAGES/DRESSINGS)
BNDG ESMARK 6X9 LF (GAUZE/BANDAGES/DRESSINGS)
BNDG GAUZE ELAST 4 BULKY (GAUZE/BANDAGES/DRESSINGS) ×2 IMPLANT
CANISTER SUCT 3000ML PPV (MISCELLANEOUS) ×3 IMPLANT
CLIP VESOCCLUDE MED 6/CT (CLIP) ×2 IMPLANT
CLIP VESOCCLUDE SM WIDE 6/CT (CLIP) ×2 IMPLANT
CUFF TOURNIQUET SINGLE 18IN (TOURNIQUET CUFF) IMPLANT
CUFF TOURNIQUET SINGLE 24IN (TOURNIQUET CUFF) IMPLANT
CUFF TOURNIQUET SINGLE 34IN LL (TOURNIQUET CUFF) IMPLANT
CUFF TOURNIQUET SINGLE 44IN (TOURNIQUET CUFF) IMPLANT
DRAIN CHANNEL 15F RND FF W/TCR (WOUND CARE) IMPLANT
DRAPE ORTHO SPLIT 77X108 STRL (DRAPES) ×3
DRAPE SURG ORHT 6 SPLT 77X108 (DRAPES) IMPLANT
ELECT REM PT RETURN 9FT ADLT (ELECTROSURGICAL) ×3
ELECTRODE REM PT RTRN 9FT ADLT (ELECTROSURGICAL) ×1 IMPLANT
EVACUATOR SILICONE 100CC (DRAIN) IMPLANT
GAUZE SPONGE 4X4 12PLY STRL LF (GAUZE/BANDAGES/DRESSINGS) ×2 IMPLANT
GLOVE BIO SURGEON STRL SZ7.5 (GLOVE) ×3 IMPLANT
GOWN STRL REUS W/ TWL LRG LVL3 (GOWN DISPOSABLE) ×1 IMPLANT
GOWN STRL REUS W/ TWL XL LVL3 (GOWN DISPOSABLE) ×3 IMPLANT
GOWN STRL REUS W/TWL LRG LVL3 (GOWN DISPOSABLE) ×3
GOWN STRL REUS W/TWL XL LVL3 (GOWN DISPOSABLE) ×6
KIT BASIN OR (CUSTOM PROCEDURE TRAY) ×3 IMPLANT
KIT TURNOVER KIT B (KITS) ×3 IMPLANT
LOOP VESSEL MAXI BLUE (MISCELLANEOUS) ×2 IMPLANT
LOOP VESSEL MINI RED (MISCELLANEOUS) ×2 IMPLANT
NS IRRIG 1000ML POUR BTL (IV SOLUTION) ×6 IMPLANT
PACK CV ACCESS (CUSTOM PROCEDURE TRAY) IMPLANT
PACK GENERAL/GYN (CUSTOM PROCEDURE TRAY) ×2 IMPLANT
PACK PERIPHERAL VASCULAR (CUSTOM PROCEDURE TRAY) IMPLANT
PACK UNIVERSAL I (CUSTOM PROCEDURE TRAY) IMPLANT
PAD ARMBOARD 7.5X6 YLW CONV (MISCELLANEOUS) ×6 IMPLANT
STAPLER VISISTAT 35W (STAPLE) IMPLANT
SUT MNCRL AB 4-0 PS2 18 (SUTURE) IMPLANT
SUT PROLENE 5 0 C 1 24 (SUTURE) IMPLANT
SUT PROLENE 6 0 BV (SUTURE) IMPLANT
SUT VIC AB 2-0 CT1 27 (SUTURE)
SUT VIC AB 2-0 CT1 TAPERPNT 27 (SUTURE) IMPLANT
SUT VIC AB 3-0 SH 27 (SUTURE)
SUT VIC AB 3-0 SH 27X BRD (SUTURE) IMPLANT
SWAB COLLECTION DEVICE MRSA (MISCELLANEOUS) ×2 IMPLANT
SWAB CULTURE ESWAB REG 1ML (MISCELLANEOUS) ×2 IMPLANT
TOWEL GREEN STERILE (TOWEL DISPOSABLE) ×3 IMPLANT
TRAY FOLEY MTR SLVR 16FR STAT (SET/KITS/TRAYS/PACK) IMPLANT
UNDERPAD 30X30 (UNDERPADS AND DIAPERS) ×3 IMPLANT
WATER STERILE IRR 1000ML POUR (IV SOLUTION) ×3 IMPLANT

## 2017-12-06 NOTE — Progress Notes (Signed)
PROGRESS NOTE   Aaron [name]  RKY:[id number]    DOB: 09-Dec-1955    DOA: 12/04/2017  PCP: [facility]   I have briefly reviewed patients previous medical records in [facility].  Brief Narrative:  62 year old male with PMH of atrial fibrillation on chronic Coumadin, mild nonobstructive CAD by cardiac cath 2002, chronic back pain (has had neck and lumbar spine surgery), COPD, GERD, ESRD on TTS HD, HTN, HLD, PAD, recent hematoma evacuation of the left upper extremity second stage basilic vein transposition and started on Keflex for possible infected hematoma, sustained fall day prior to admission with syncope and split open his left upper arm incision with associated bleeding and initially presented to the [name] with hemoglobin was 6.5 (was 7.9 four days prior).  INR was 1.67.  He was transfused 2 units of PRBCs.  He continued to have bleeding from left upper extremity AV fistula, coagulopathy was reversed with 2 units of FFP and 5 mg of IV vitamin K.  He was transferred to [facility] for vascular surgery evaluation.  He underwent exploration of left upper extremity wounds with washout and ligation of basilic vein AV fistula.  He now has Serratia marcescens bacteremia and is on empiric IV ceftriaxone.   Assessment & Plan:   Active Problems:   Syncope   Anemia   Acute blood loss anemia due to traumatic injury to left upper extremity fistula, complicating anemia of ESRD: Status post 2 units PRBC transfusion and hemoglobin up to 8.3.  Source of bleeding has been repaired by vascular surgery.  Follow CBCs closely and transfuse if hemoglobin 7 or less.  ESRD on TTS HD: Looks like patient went HD on 8/24 via right HD catheter.  Reportedly noncompliant with dialysis the last 2 weeks.  Nephrology consulted for dialysis needs.  Serratia marcescens bacteremia: Possible sources include recent infected hematoma evacuation/left upper extremity cellulitis, HD catheter.   Continue IV ceftriaxone per pharmacy pending final culture results.  Decision to be made regarding HD catheter removal.  Nephrology aware.  Essential hypertension: Mildly uncontrolled.  Thrombocytopenia: May be multifactorial related to acute blood loss, bacteremia and acute illness.  Follow CBCs closely.  Platelet transfusions for now.  Paroxysmal atrial fibrillation, now RVR: Patient initially presented with sinus rhythm but went into A. fib with RVR on 8/24 at around 3:18 PM.  Likely precipitated by acute illness (infection, pain, bleeding, anemia).  Ensure carvedilol 25 mg twice daily, PRN IV metoprolol.  If RVR persists, consider IV diltiazem drip.  TTE 08/25/2017: LVEF 55-60%.  Coumadin on hold and INR reversed due to bleeding issues as discussed.  COPD: No clinical bronchospasm.  Fall:?  Related to hypotension/orthostatic changes from acute blood loss anemia and bacteremia.  PT and OT evaluation when able.  Syncope: Patient reported that he passed out after he fell to the ground on his back.  Again may be related to hypotension/orthostatic changes or vasovagal.  Check orthostatic blood pressures.  Subacute on chronic low back pain: No deformities noted.  Precipitated by fall.  Judicious use of pain medicines.  Hyponatremia: Related to ESRD and dialysis.  Stable.  Management per nephrology.   DVT prophylaxis: SCDs Code Status: Full Family Communication: None at bedside Disposition: DC home pending clinical improvement and medical stability.  May take several days.   Consultants:  Vascular surgery Nephrology  Procedures:  Exploration of left upper extremity wounds with washout and ligation of basilic vein AV fistula by vascular surgery on 8/25.  Antimicrobials:  IV ceftriaxone   Subjective: Seen this morning prior to procedure.  Complaining of low back pain and neck pain.  No chest pain, dyspnea, palpitations reported.  As per nursing, oozing of blood from left upper  extremity wounds prior to procedure.  ROS: As above otherwise negative.  Indicates that he has hardware in his neck from prior surgery.   Objective:  Vitals:   12/06/17 1200 12/06/17 1215 12/06/17 1236 12/06/17 1300  BP: (!) 131/95 (!) 122/91 112/85   Pulse: (!) 123 (!) 119 (!) 135   Resp: 18 20 19    Temp:      TempSrc:      SpO2: 100% 97% 100% 100%  Weight:      Height:        Examination:  General exam: Middle-age male, small built and frail, lying comfortably propped up in bed. Respiratory system: Clear to auscultation. Respiratory effort normal.  Right IJ HD catheter site intact without acute findings of findings suggestive of infection. Cardiovascular system: S1 & S2 heard, irregularly irregular and tachycardic. No JVD, murmurs, rubs, gallops or clicks. No pedal edema.  Telemetry personally reviewed: A. fib with RVR with heart rate ranging in the 110s-130s this morning.  10 beat wide-complex tachycardia,?  Aberrancy versus NSVT, noted at 1:47 AM on 8/25 Gastrointestinal system: Abdomen is nondistended, soft and nontender. No organomegaly or masses felt. Normal bowel sounds heard. Central nervous system: Alert and oriented. No focal neurological deficits. Extremities: Symmetric 5 x 5 power.  Left upper extremity in compression dressing this morning and did not attempt to remove to examine due to ongoing bleeding issues.  Left forearm with chronic edema but no features suggestive of active infection Skin: No rashes, lesions or ulcers Psychiatry: Judgement and insight appear normal. Mood & affect appropriate. Musculoskeletal: Examined patient's back in detail by turning him over with assistance.  No deformity, point tenderness or spasms noted.    Data Reviewed: I have personally reviewed following labs and imaging studies  CBC: Recent Labs  Lab 12/01/17 1412 12/02/17 1443 12/04/17 2205 12/05/17 0503 12/06/17 0501  WBC 6.9 8.5 5.4 3.4* 7.6  NEUTROABS 5.5  --  4.7  --  6.5    HGB 8.8* 7.9* 6.5* 7.2* 8.3*  HCT 27.0* 24.5* 18.8* 21.4* 24.9*  MCV 100.7* 100.8* 95.4 96.8 96.1  PLT 221 155 108* 96* 75*   Basic Metabolic Panel: Recent Labs  Lab 12/01/17 1412 12/02/17 1443 12/04/17 2205 12/05/17 0503 12/06/17 0501  NA 135 130* 129* 130* 134*  K 4.7 4.9 3.8 3.9 3.9  CL 105 101 95* 97* 94*  CO2 23 22 25 27  34*  GLUCOSE 131* 102* 124* 128* 112*  BUN 44* 39* 25* 32* 16  CREATININE 5.01* 4.49* 2.85* 3.15* 2.40*  CALCIUM 8.3* 8.0* 6.8* 6.8* 7.5*   Liver Function Tests: Recent Labs  Lab 12/04/17 2205 12/05/17 0503  AST 46* 42*  ALT 10 12  ALKPHOS 86 89  BILITOT 0.6 0.5  PROT 5.0* 5.2*  ALBUMIN 2.2* 2.2*   Coagulation Profile: Recent Labs  Lab 12/04/17 2205 12/06/17 0501  INR 1.67 1.20   Cardiac Enzymes: Recent Labs  Lab 12/05/17 0502  TROPONINI 0.04*   HbA1C: No results for input(s): HGBA1C in the last 72 hours. CBG: Recent Labs  Lab 12/02/17 1447  GLUCAP 86    Recent Results (from the past 240 hour(s))  Culture, blood (Routine x 2)     Status: None (Preliminary result)   Collection Time: 12/04/17  9:41 PM  Result Value Ref Range Status   Specimen Description   Final    BLOOD RIGHT FOREARM Performed at [facility], [address], [place], [place] [address]    Special Requests   Final    BOTTLES DRAWN AEROBIC AND ANAEROBIC Blood Culture adequate volume Performed at [facility], [address], [place], [place] [address]    Culture  Setup Time   Final    GRAM NEGATIVE RODS Gram Stain Report Called to,Read Back By and Verified With: JACKSON,N. AT 3220 ON 12/05/2017 BY EVA AEROBIC BOTTLE ONLY Performed at [place] TO FOLLOW Performed at [facility], [place] [address], [place], [place] [address]    Report Status PENDING  Incomplete  Culture, blood (Routine x 2)     Status: Abnormal (Preliminary result)   Collection Time: 12/04/17 10:06  PM  Result Value Ref Range Status   Specimen Description   Final    BLOOD RIGHT HAND Performed at [facility], [address], [place], [place] [address]    Special Requests   Final    BOTTLES DRAWN AEROBIC AND ANAEROBIC Blood Culture adequate volume Performed at [facility], [address], [place], [place] [address]    Culture  Setup Time   Final    GRAM NEGATIVE RODS Gram Stain Report Called to,Read Back By and Verified With: JACKSON,N. AT 1421 ON 12/05/2017 BY EVA AEROBIC BOTTLE ONLY Performed at [facility]    Culture (A)  Final    SERRATIA MARCESCENS SUSCEPTIBILITIES TO FOLLOW Performed at [facility], [place] [address] [place], [place] [address]    Report Status PENDING  Incomplete  Blood Culture ID Panel (Reflexed)     Status: Abnormal   Collection Time: 12/04/17 10:06 PM  Result Value Ref Range Status   Enterococcus species NOT DETECTED NOT DETECTED Final   Listeria monocytogenes NOT DETECTED NOT DETECTED Final   Staphylococcus species NOT DETECTED NOT DETECTED Final   Staphylococcus aureus NOT DETECTED NOT DETECTED Final   Streptococcus species NOT DETECTED NOT DETECTED Final   Streptococcus agalactiae NOT DETECTED NOT DETECTED Final   Streptococcus pneumoniae NOT DETECTED NOT DETECTED Final   Streptococcus pyogenes NOT DETECTED NOT DETECTED Final   Acinetobacter baumannii NOT DETECTED NOT DETECTED Final   Enterobacteriaceae species DETECTED (A) NOT DETECTED Final    Comment: Enterobacteriaceae represent a large family of gram-negative bacteria, not a single organism. CRITICAL RESULT CALLED TO, READ BACK BY AND VERIFIED WITH: Marissa Nestle, RN (APH) AT 1810 ON 12/05/17 BY C. JESSUP, MLT.    Enterobacter cloacae complex NOT DETECTED NOT DETECTED Final   Escherichia coli NOT DETECTED NOT DETECTED Final   Klebsiella oxytoca NOT DETECTED NOT DETECTED Final   Klebsiella pneumoniae NOT DETECTED NOT DETECTED Final   Proteus species NOT DETECTED NOT DETECTED Final     Serratia marcescens DETECTED (A) NOT DETECTED Final    Comment: CRITICAL RESULT CALLED TO, READ BACK BY AND VERIFIED WITH: Marissa Nestle, RN (APH) AT 1810 ON 12/05/17 BY C. JESSUP, MLT.    Carbapenem resistance NOT DETECTED NOT DETECTED Final   Haemophilus influenzae NOT DETECTED NOT DETECTED Final   Neisseria meningitidis NOT DETECTED NOT DETECTED Final   Pseudomonas aeruginosa NOT DETECTED NOT DETECTED Final   Candida albicans NOT DETECTED NOT DETECTED Final   Candida glabrata NOT DETECTED NOT DETECTED Final   Candida krusei NOT  DETECTED NOT DETECTED Final   Candida parapsilosis NOT DETECTED NOT DETECTED Final   Candida tropicalis NOT DETECTED NOT DETECTED Final    Comment: Performed at [facility], [place] [address], [place], [place] [address]         Radiology Studies: Dg Chest 2 View  Result Date: 12/04/2017 CLINICAL DATA:  Altered mentation and fever tonight after missing 2 dialysis treatments. Former smoker. EXAM: CHEST - 2 VIEW COMPARISON:  12/02/2017 FINDINGS: Dual lumen dialysis catheter tip terminates in the distal SVC. Heart size is top-normal. There is mild aortic atherosclerosis without aneurysm. Hyperinflated lungs with bibasilar atelectasis. No confluent airspace disease. Slight increase in interstitial prominence may reflect mild interstitial edema. No effusion or pneumothorax. IMPRESSION: Slight increase in interstitial prominence since prior with mild central vascular congestion. Findings may reflect mild interstitial edema. Otherwise negative. Electronically Signed   By: Ashley Royalty M.D.   On: 12/04/2017 22:19        Scheduled Meds: . sodium chloride   Intravenous Once  . calcitRIOL  0.25 mcg Oral Q T,Th,Sa-HD  . calcium acetate  667 mg Oral TID WC  . carvedilol  25 mg Oral BID  . Chlorhexidine Gluconate Cloth  6 each Topical Q0600  . cholecalciferol  2,000 Units Oral Daily  . epoetin (EPOGEN/PROCRIT) injection  10,000 Units Intravenous Q T,Th,Sa-HD  .  ipratropium-albuterol  3 mL Nebulization Q6H  . multivitamin  1 tablet Oral Daily  . pantoprazole  40 mg Oral Daily  . sodium chloride flush  3 mL Intravenous Q12H   Continuous Infusions: . sodium chloride    . sodium chloride    . sodium chloride 100 mL (12/05/17 2140)  . cefTRIAXone (ROCEPHIN)  IV Stopped (12/05/17 1621)     LOS: 0 days     Vernell Leep, MD, FACP, [facility]. [facility] Pager [phone]  If 7PM-7AM, please contact night-coverage [email or web address] Password [email or web address] 12/06/2017, 1:11 PM

## 2017-12-06 NOTE — Progress Notes (Signed)
PHARMACY - PHYSICIAN COMMUNICATION CRITICAL VALUE ALERT - BLOOD CULTURE IDENTIFICATION (BCID)  Aaron [name] is an 62 y.o. male who presented to [facility] on 12/04/2017 with a chief complaint of syncope  Assessment: On antibiotics for cellulitis  Name of physician (or Provider) Contacted: MD has already been notified and antibiotics were change for cephalexin to ceftriaxone  Current antibiotics: Rocephin 2gm IV q24h since 8/24  Changes to prescribed antibiotics recommended:  Patient is on recommended antibiotics - No changes needed  Results for orders placed or performed during the hospital encounter of 12/04/17  Blood Culture ID Panel (Reflexed) (Collected: 12/04/2017 10:06 PM)  Result Value Ref Range   Enterococcus species NOT DETECTED NOT DETECTED   Listeria monocytogenes NOT DETECTED NOT DETECTED   Staphylococcus species NOT DETECTED NOT DETECTED   Staphylococcus aureus NOT DETECTED NOT DETECTED   Streptococcus species NOT DETECTED NOT DETECTED   Streptococcus agalactiae NOT DETECTED NOT DETECTED   Streptococcus pneumoniae NOT DETECTED NOT DETECTED   Streptococcus pyogenes NOT DETECTED NOT DETECTED   Acinetobacter baumannii NOT DETECTED NOT DETECTED   Enterobacteriaceae species DETECTED (A) NOT DETECTED   Enterobacter cloacae complex NOT DETECTED NOT DETECTED   Escherichia coli NOT DETECTED NOT DETECTED   Klebsiella oxytoca NOT DETECTED NOT DETECTED   Klebsiella pneumoniae NOT DETECTED NOT DETECTED   Proteus species NOT DETECTED NOT DETECTED   Serratia marcescens DETECTED (A) NOT DETECTED   Carbapenem resistance NOT DETECTED NOT DETECTED   Haemophilus influenzae NOT DETECTED NOT DETECTED   Neisseria meningitidis NOT DETECTED NOT DETECTED   Pseudomonas aeruginosa NOT DETECTED NOT DETECTED   Candida albicans NOT DETECTED NOT DETECTED   Candida glabrata NOT DETECTED NOT DETECTED   Candida krusei NOT DETECTED NOT DETECTED   Candida parapsilosis NOT DETECTED NOT DETECTED   Candida tropicalis NOT DETECTED NOT DETECTED    Hildred Laser, PharmD Clinical Pharmacist Please check Amion for pharmacy contact number

## 2017-12-06 NOTE — Progress Notes (Signed)
Paged placed to Dr Donzetta Matters, r/t drainage on surgical dressing.  Orders received to reinforce the site.  3 ABD's and ace wrap placed on top of present dressing.

## 2017-12-06 NOTE — Anesthesia Procedure Notes (Signed)
Procedure Name: Intubation Date/Time: 12/06/2017 10:17 AM Performed by: Leonor Liv, CRNA Pre-anesthesia Checklist: Patient identified, Emergency Drugs available, Suction available and Patient being monitored Patient Re-evaluated:Patient Re-evaluated prior to induction Oxygen Delivery Method: Circle System Utilized Preoxygenation: Pre-oxygenation with 100% oxygen Induction Type: IV induction, Cricoid Pressure applied and Rapid sequence Laryngoscope Size: Mac and 4 Grade View: Grade I Tube type: Oral Tube size: 7.5 mm Number of attempts: 1 Airway Equipment and Method: Stylet and Oral airway Placement Confirmation: ETT inserted through vocal cords under direct vision,  positive ETCO2 and breath sounds checked- equal and bilateral Secured at: 23 cm Tube secured with: Tape Dental Injury: Teeth and Oropharynx as per pre-operative assessment

## 2017-12-06 NOTE — Progress Notes (Signed)
Protocol assessment done and patient scored a 9. Per protocol patient changed to TID time for duo neb and a Prn was added.

## 2017-12-06 NOTE — Anesthesia Postprocedure Evaluation (Signed)
Anesthesia Post Note  Patient: Aaron [name]  Procedure(s) Performed: EVACUATION HEMATOMA (Left )     Patient location during evaluation: PACU Anesthesia Type: General Level of consciousness: awake and alert Pain management: pain level controlled Vital Signs Assessment: post-procedure vital signs reviewed and stable Respiratory status: spontaneous breathing, nonlabored ventilation, respiratory function stable and patient connected to nasal cannula oxygen Cardiovascular status: blood pressure returned to baseline and stable Postop Assessment: no apparent nausea or vomiting Anesthetic complications: no    Last Vitals:  Vitals:   12/06/17 1300 12/06/17 1351  BP: 114/83   Pulse: (!) 158   Resp: 14   Temp:    SpO2: 100% 100%    Last Pain:  Vitals:   12/06/17 1236  TempSrc: Oral  PainSc:                  [name] DAVID

## 2017-12-06 NOTE — Consult Note (Addendum)
Renal Service Consult Note [facility]  Aaron [name] 12/06/2017 Sol Blazing Requesting Physician:  Dr Algis Liming  Reason for Consult: Dr Donzetta Matters HPI: The patient is a 62 y.o. 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 Also has hx of MI, HL, hep C, HTN, COPD, chronic back pain, afib and PAD.  Has had multiple bleeding episodes and wound issues the last 3-4 mos >>    - may 2019 percutaneous revasc of RLE byVVS   - new esrd admit, 1st stage L BVT on 5/23   - June 2019 I&D of R groin wound from may procedure   - June 2019 admit again for rectus sheath hematoma, treated conservatively holding coumadin /plavix   - July 2019 R groin wound infection had I&D of groin wound and abdominal wall w/ placement of abx beads    - Nov 20 2017 > 2nd stage LUA BVT   - Nov 23 2017 - admit for evac of LUE hematoma  - Dec 06 2017 > admit (today) for L arm bleed , AVF ligated and wound washed out   Patient is in the room, post-op, having some L arm pain, no further bleeding, no CP or SOB, no nv/d/, no abd pain.  He missed some HD recently but then got 3 sessions last week on Tues/ wed and Friday per the patient.  Some mild LE edema, no SOB or coughing.    Pt was admitted 2 days ago , had fever then and blood cx's are growing serratia in 2/2 cx's.  He is on IV Rocephin.   He gets HD in [place] w/ Dr Hinda Lenis on a MWF schedule.  Has R chest cath and had a LUA AVF developing which has now been ligated.    ROS  denies CP  no joint pain   no HA  no blurry vision  no rash  no diarrhea  no nausea/ vomiting   Past Medical History  Past Medical History:  Diagnosis Date  . Anxiety   . Arthritis    knees , back , shoulders (10/12/2017)  . Atrial fibrillation ([place])   . CAD (coronary artery disease)    Mild nonobstructive disease at cardiac catheterization 2002  . Chronic back pain    "back of my neck; down thru my legs" (10/12/2017)  . COPD (chronic obstructive  pulmonary disease) ([place])   . Diverticulitis   . Esophageal reflux   . ESRD (end stage renal disease) on dialysis [facility])    "TTS; [facility]" (11/23/2017)  . Essential hypertension   . Hepatitis C    states he no longer has this  . History of kidney stones   . History of syncope   . Hyperlipidemia   . Myocardial infarction ([place]) 02/2017   "light one" (10/12/2017)  . PAT (paroxysmal atrial tachycardia) ([place])   . Peripheral arterial disease ([place])    Occluded left superficial femoral artery status post stent June 2016 - Dr. Trula Slade  . Pneumonia 1961  . Polycystic kidney, unspecified type   . Syncope 09/2014   Past Surgical History  Past Surgical History:  Procedure Laterality Date  . ABDOMINAL AORTOGRAM N/A 08/11/2017   Procedure: ABDOMINAL AORTOGRAM;  Surgeon: Serafina Mitchell, MD;  Location: [place] [facility];  Service: Cardiovascular;  Laterality: N/A;  . ANTERIOR CERVICAL DECOMP/DISCECTOMY FUSION    . APPLICATION OF WOUND VAC Right 08/14/2017   Procedure: APPLICATION OF PREVENA INCISIONAL WOUND VAC RIGHT GROIN;  Surgeon: Trula Slade,  Butch Penny, MD;  Location: [facility] OR;  Service: Vascular;  Laterality: Right;  . AV FISTULA PLACEMENT Left 09/04/2017   Procedure: ARTERIOVENOUS (AV) FISTULA CREATION LEFT UPPER ARM;  Surgeon: Serafina Mitchell, MD;  Location: [place];  Service: Vascular;  Laterality: Left;  . BACK SURGERY    . BASCILIC VEIN TRANSPOSITION Left 11/20/2017   Procedure: SECOND STAGE BASILIC VEIN TRANSPOSITION LEFT ARM;  Surgeon: Serafina Mitchell, MD;  Location: [place];  Service: Vascular;  Laterality: Left;  . BIOPSY  12/17/2016   Procedure: BIOPSY;  Surgeon: Daneil Dolin, MD;  [facility];  Service: Gastroenterology;;  gastric colon  . BIOPSY  04/29/2017   Procedure: BIOPSY;  Surgeon: Daneil Dolin, MD;  [facility];  Service: Endoscopy;;  duodenal biopsies  . BUNIONECTOMY Bilateral   . COLONOSCOPY  2008   Dr. Oneida Alar: rare sigmoid colon diverticulosis, internal  hemorrhoids.   . COLONOSCOPY WITH PROPOFOL N/A 12/17/2016   dense left-sided diverticulosis, right colon ulcers s/p biopsy query occult NSAID use vs transient ischemia, not consistent with IBD. CMV stains negative.   Marland Kitchen ENDARTERECTOMY FEMORAL Right 08/14/2017   Procedure: RIGHT ILLIO-FEMORAL ENDARTERECTOMY;  Surgeon: Serafina Mitchell, MD;  Location: [facility] OR;  Service: Vascular;  Laterality: Right;  . ESOPHAGEAL DILATION  12/17/2016   EGD with mild Schatzki's ring s/p dilatation, small hiatal hernia, erosive gastropathy (negative H.pylori gastritis)  . ESOPHAGOGASTRODUODENOSCOPY  2008   Dr. Oneida Alar: normal esophagus without Barrett's, antritis and duodenitis, path with H.pylori gastritis  . ESOPHAGOGASTRODUODENOSCOPY (EGD) WITH PROPOFOL N/A 12/17/2016   Procedure: ESOPHAGOGASTRODUODENOSCOPY (EGD) WITH PROPOFOL;  Surgeon: Daneil Dolin, MD;  [facility];  Service: Gastroenterology;  Laterality: N/A;  . ESOPHAGOGASTRODUODENOSCOPY (EGD) WITH PROPOFOL N/A 04/29/2017   Patchy erythema of gastric mucosa diffusely, extensive inflammatory changes in duodenum, geographic ulceration and mucosal edema present, encroaching somewhat on the lumen yet still widely patent, distal second portion of duodenum appeared abnormal, path with peptic duodenitis with ulceration  . GROIN DEBRIDEMENT Right 10/14/2017   Procedure: RIGHT GROIN AND RIGHT LOWER QUADRANT ABDOMEN DEBRIDEMENT WITH PLACEMENT OF ANTIBIOTIC BEADS;  Surgeon: Serafina Mitchell, MD;  Location: [place];  Service: Vascular;  Laterality: Right;  . HERNIA REPAIR  2353   umbilical  . I&D EXTREMITY Right 09/21/2017   Procedure: IRRIGATION AND DEBRIDEMENT GROIN;  Surgeon: Elam Dutch, MD;  Location: [facility] OR;  Service: Vascular;  Laterality: Right;  . I&D EXTREMITY Left 11/23/2017   Procedure: Evacuation Hematoma LEFT UPPER ARM GRAFT;  Surgeon: Elam Dutch, MD;  Location: [place];  Service: Vascular;  Laterality: Left;  . INGUINAL HERNIA REPAIR Right 02/2017    [place]  . INSERTION OF DIALYSIS CATHETER Right 09/04/2017   Procedure: INSERTION OF TUNNELED  DIALYSIS CATHETER - RIGHT INTERNAL JUGULAR PLACEMENT;  Surgeon: Serafina Mitchell, MD;  Location: [place];  Service: Vascular;  Laterality: Right;  . INSERTION OF ILIAC STENT Right 08/14/2017   Procedure: INSERTION OF RIGHT COMMON ILIAC STENT 73mm x 86mm x 130cm INSERTION OF RIGHT EXTERNAL ILIAC STENT 69mm x 34mm x 130cm INSERTION OF SUPERFICIAL FERMORAL ARTERY STENT 41mm x 91mm x 130cm;  Surgeon: Serafina Mitchell, MD;  Location: [facility] OR;  Service: Vascular;  Laterality: Right;  . IR FLUORO GUIDE CV LINE RIGHT  08/31/2017  . IR US GUIDE VASC ACCESS RIGHT  08/31/2017  . LOWER EXTREMITY ANGIOGRAPHY Right 08/11/2017   Procedure: LOWER EXTREMITY ANGIOGRAPHY;  Surgeon: Serafina Mitchell, MD;  Location:  [place] IN[facility] LAB;  Service: Cardiovascular;  Laterality: Right;  . PATCH ANGIOPLASTY Right 08/14/2017   Procedure: PATCH ANGIOPLASTY USING HEMASHIELD PATCH 0.3IN Lillie Columbia;  Surgeon: Serafina Mitchell, MD;  Location: [facility] OR;  Service: Vascular;  Laterality: Right;  . PERIPHERAL VASCULAR CATHETERIZATION N/A 09/20/2014   Procedure: Abdominal Aortogram;  Surgeon: Serafina Mitchell, MD;  Location: [place] CV [facility];  Service: Cardiovascular;  Laterality: N/A;  . POSTERIOR FUSION LUMBAR SPINE     Family History  Family History  Problem Relation Age of Onset  . Alcoholism Mother   . Heart disease Father        Massive heart attack  . Heart attack Father   . Atrial fibrillation Father   . Colon cancer Father   . Colon cancer Maternal Grandfather 69  . Alcoholism Maternal Grandfather   . Renal cancer Cousin   . Ovarian cancer Sister    Social History  reports that he quit smoking about 4 months ago. His smoking use included cigarettes. He has a 94.00 pack-year smoking history. He has never used smokeless tobacco. He reports that he drank alcohol. He reports that he has current or past drug history. Allergies No Known  Allergies Home medications Prior to Admission medications   Medication Sig Start Date End Date Taking? Authorizing Provider  B Complex-C-Folic Acid (DIALYVITE 417) 0.8 MG TABS Take 1 tablet by mouth daily. 09/15/17  Yes [provider]  BREO ELLIPTA 200-25 MCG/INH AEPB Inhale 1 puff into the lungs daily as needed (wheezing).  07/27/17  Yes [provider]  calcitRIOL (ROCALTROL) 0.25 MCG capsule Take 1 capsule (0.25 mcg total) by mouth Every Tuesday,Thursday,and Saturday with dialysis. 09/08/17  Yes Ghimire, Henreitta Leber, MD  calcium acetate (PHOSLO) 667 MG capsule Take 667 mg by mouth 3 (three) times daily with meals.  09/15/17  Yes [provider]  carvedilol (COREG) 25 MG tablet Take 1 tablet (25 mg total) 2 (two) times daily by mouth. 02/25/17 12/04/17 Yes Imogene Burn, PA-C  cholecalciferol (VITAMIN D) 1000 units tablet Take 2,000 Units by mouth daily.   Yes [provider]  oxyCODONE-acetaminophen (PERCOCET/ROXICET) 5-325 MG tablet Take 1 tablet by mouth every 4 (four) hours as needed for severe pain. Patient taking differently: Take 2 tablets by mouth every 4 (four) hours as needed for severe pain.  11/30/17  Yes Serafina Mitchell, MD  pantoprazole (PROTONIX) 40 MG tablet Take 1 tablet (40 mg total) by mouth 2 (two) times daily. 04/30/17 12/04/17 Yes Shah, Pratik D, DO  warfarin (COUMADIN) 5 MG tablet Take 1 tablet (5 mg total) by mouth daily. Patient taking differently: Take 2.5-5 mg by mouth See admin instructions. 2.5mg  on Friday and 5mg  on all other days 10/21/17 10/21/18 Yes Rhyne, Hulen Shouts, PA-C  cephALEXin (KEFLEX) 500 MG capsule Take 1 capsule (500 mg total) by mouth 3 (three) times daily for 7 days. 11/30/17 12/07/17  Serafina Mitchell, MD  Nutritional Supplements (FEEDING SUPPLEMENT, NEPRO CARB STEADY,) LIQD Take 237 mLs by mouth 2 (two) times daily between meals. Patient not taking: Reported on 12/02/2017 10/21/17   Gabriel Earing, PA-C   Liver Function  Tests Recent Labs  Lab 12/04/17 2205 12/05/17 0503  AST 46* 42*  ALT 10 12  ALKPHOS 86 89  BILITOT 0.6 0.5  PROT 5.0* 5.2*  ALBUMIN 2.2* 2.2*   No results for input(s): LIPASE, AMYLASE in the last 168 hours. CBC Recent Labs  Lab 12/04/17 2205 12/05/17 0503 12/06/17  0501 12/06/17 1621  WBC 5.4 3.4* 7.6 6.5  NEUTROABS 4.7  --  6.5 5.9  HGB 6.5* 7.2* 8.3* 7.5*  HCT 18.8* 21.4* 24.9* 22.5*  MCV 95.4 96.8 96.1 96.2  PLT 108* 96* 75* 59*   Basic Metabolic Panel Recent Labs  Lab 12/01/17 1412 12/02/17 1443 12/04/17 2205 12/05/17 0503 12/06/17 0501  NA 135 130* 129* 130* 134*  K 4.7 4.9 3.8 3.9 3.9  CL 105 101 95* 97* 94*  CO2 23 22 25 27  34*  GLUCOSE 131* 102* 124* 128* 112*  BUN 44* 39* 25* 32* 16  CREATININE 5.01* 4.49* 2.85* 3.15* 2.40*  CALCIUM 8.3* 8.0* 6.8* 6.8* 7.5*   Iron/TIBC/Ferritin/ %Sat    Component Value Date/Time   IRON 45 08/31/2017 0427   TIBC 232 (L) 08/31/2017 0427   IRONPCTSAT 19 08/31/2017 0427    Vitals:   12/06/17 1236 12/06/17 1300 12/06/17 1351 12/06/17 1600  BP: 112/85 114/83  97/74  Pulse: (!) 135 (!) 158    Resp: 19 14  (!) 24  Temp: 98.1 F (36.7 C)     TempSrc: Oral     SpO2: 100% 100% 100%   Weight:      Height:       Exam Gen up in bed, good spirits, WDWN, no distress No rash, cyanosis or gangrene Sclera anicteric, throat clear  No jvd or bruits Chest clear bilat to bases, no rales or wheezing RRR no MRG Abd soft ntnd no mass or ascites +bs GU normal male MS no joint effusions or deformity Ext LUA is in an ACE wrap, no bruit, no bleeding. 1+ bilat pretib edema, no wounds or ulcers Neuro is alert, Ox 3 , nf    Home meds:  - carvedilol 25 bid  - warfarin 2.5- 5 mg qd  - breo ellipta 200-25 prn  - calc acetate 667 tid ac  - pantoprazole 40 bid/ cephalexin 500 tid/ oxycodone-aceta qid prn/ vitamins  Dialysis: [facility]  4h  Hep 2700  R IJ TDC  (LUA AVF now ligated)     Impression: 1. Bleeding left  arm AVF - sp washout and AVF ligation (placed June '19) 2. ESRD on HD (ADPKD) - started in May 2019, using [facility] R chest.  3. Serratia bacteremia - fevers resolved last 24 hrs.  No gross HD cath infection, suspect this may be due to infected L arm wound (sp L arm hematoma evac on 11/23/17), will not remove HD cath just yet. If infection doesn't resolve will consider cath removal.   4. Atrial fib - on coumadin and BB 5. HTN - bp's stable, on coreg only possibly for afib 6. Anemia ckd/ abl - transfuse prn if Hb < 7   Plan - HD Monday, UF 2-3 L as tol. Get OP records. IV abx.   Kelly Splinter MD [employer] pager [phone]   12/06/2017, 6:02 PM

## 2017-12-06 NOTE — Transfer of Care (Signed)
Immediate Anesthesia Transfer of Care Note  Patient: Aaron [name]  Procedure(s) Performed: EVACUATION HEMATOMA (Left )  Patient Location: PACU  Anesthesia Type:General  Level of Consciousness: awake, alert  and oriented  Airway & Oxygen Therapy: Patient Spontanous Breathing and Patient connected to nasal cannula oxygen  Post-op Assessment: Report given to RN, Post -op Vital signs reviewed and stable and Patient moving all extremities  Post vital signs: Reviewed and stable  Last Vitals:  Vitals Value Taken Time  BP    Temp    Pulse 103 12/06/2017 11:12 AM  Resp 12 12/06/2017 11:12 AM  SpO2 98 % 12/06/2017 11:12 AM  Vitals shown include unvalidated device data.  Last Pain:  Vitals:   12/06/17 0800  TempSrc:   PainSc: 8       Patients Stated Pain Goal: 2 (24/26/83 4196)  Complications: No apparent anesthesia complications

## 2017-12-06 NOTE — Progress Notes (Signed)
Received Aaron [name] from PACU.  Patient is drowsy and easily aroused to voice. Denies pain in the left arm.  States he still has pain in his back 8/10.  Oriented to person, place and time.  VSS   BBS clear through out, on 3 L O2 via [place].  Intermittent congested cough present.  Dressing to left upper arm is saturated with bloody drainage on posterior side.  Left arm warm to touch and radial pulse is 2+, with positive cap refill.  IV in the left forearm running NS at 10 cc/ hour.  Bed placed in low position and side rails up x 4.  Bed alarm engaged.

## 2017-12-06 NOTE — Op Note (Signed)
    Patient name: CASTULO SCARPELLI MRN: [id number] DOB: 06-28-1955 Sex: male  12/06/2017 Pre-operative Diagnosis: End-stage renal disease, hematoma left upper extremity Post-operative diagnosis:  Same Surgeon:  Eda Paschal. Donzetta Matters, MD Procedure Performed: Exploration of left upper extremity wounds with washout and ligation of basilic vein AV fistula  Indications: 62 year old male with end-stage renal disease has recently undergone second stage basilic vein transposed fistula.  He substernally had a hematoma evacuation from this wound.  He is on Coumadin chronically.  He had a fall at his home and had subsequent bleeding as well as neck and back pain.  He is now transferred for definitive evaluation of his bleeding left upper extremity wound.  Findings: There was significant clot in the distal incision near the antecubitum.  Upon removing this clot we had pulsatile bleeding in the wound were able to get pressure on this immediately.  I then opened the wound towards the antecubitum dissected down were identified the fistula and placed clamp proximally and distally on and had good hemostasis.  Unfortunately near the anastomosis the fistula was significantly diseased and friable.  The brachial artery itself was not in the wound.  I confirmed this with Doppler and he still had a good radial signal with the fistula clamped.  The fistula itself was transected and tied off to the axilla and oversewn near the antecubitum.  The wound near the axilla was noted to have some draining pus although this did not communicate with the wound near the antecubitum this was opened.  Both wounds were copiously irrigated and packed open with wet-to-dry gauze.    Procedure:  The patient was identified in the holding area and taken to the operating room where is placed supine the operative table and general anesthesia was induced.  Sterilely prepped and draped in the left upper extremity given antibiotics and timeout was called.  We  began by extending our incision near the antecubitum and removing the copious clot that was in the wound.  I then had strong pulsatile bleeding and held digital pressure.  I extended the incision further was able to dissect out what it was ultimately identified as the basilic vein and this was clamped proximally and distally and had a large rent within it.  The fistula towards the antecubitum was incredibly unhealthy appearing and friable.  I then dissected back until it was healthy and clamped this.  I used Doppler and confirmed brachial artery pulse as well as radial artery as there is significant scar tissue in the wound.  I then transected the fistula removed a section where it was injured.  Towards axillae tied off with 2-0 silk suture.  Towards the antecubitum I oversewed with 5-0 Prolene suture in a mattress configuration.  I did have a palpable radial pulse still completion of this.  There was some purulence in the wound as well as the wound towards the axilla and so I removed the sutures and open that wound as well.  I then copiously irrigated both wounds.  They were both packed open with wet-to-dry Kerlix.  The arm was clean sterile dressing applied.  He was allowed away from anesthesia having tolerated procedure without immediate complication.  EBL 150 cc.  Specimen: Left arm anaerobic aerobic cultures  Malikai Gut C. Donzetta Matters, MD [facility] Office: [phone] Pager: [phone]

## 2017-12-06 NOTE — Progress Notes (Addendum)
Found patient bleeding from left upper arm AV fistula site.  Dressing was off.  Replaced bulky gauze dressing, with Kerlix and ace wrap to control bleeding.  + Cap refill after placing dressing.  CHG bath given per request of short stay nurse.  Transported to Short Stay for surgery.  Report called to [facility].

## 2017-12-06 NOTE — Consult Note (Signed)
Hospital Consult    Reason for Consult: Left upper arm wound Referring Physician: Dr. Algis Liming MRN #:  [id number]  History of Present Illness: This is a 62 y.o. male history of end-stage renal disease currently on dialysis via catheter.  He had previously had poor wound healing from the right groin incision now has had hematoma evacuation of the left upper extremity second stage basilic vein transposition.  He is on Coumadin.  Recently fell hurt his neck and back and also split open his left upper arm incision.  He was transferred to [facility] for further evaluation.  Chief complaints are back and neck pain.  States that the left arm has been oozing.  INR has been reversed.  Past Medical History:  Diagnosis Date  . Anxiety   . Arthritis    knees , back , shoulders (10/12/2017)  . Atrial fibrillation ([place])   . CAD (coronary artery disease)    Mild nonobstructive disease at cardiac catheterization 2002  . Chronic back pain    "back of my neck; down thru my legs" (10/12/2017)  . COPD (chronic obstructive pulmonary disease) ([place])   . Diverticulitis   . Esophageal reflux   . ESRD (end stage renal disease) on dialysis [facility])    "TTS; [place]" (11/23/2017)  . Essential hypertension   . Hepatitis C    states he no longer has this  . History of kidney stones   . History of syncope   . Hyperlipidemia   . Myocardial infarction ([place]) 02/2017   "light one" (10/12/2017)  . PAT (paroxysmal atrial tachycardia) ([place])   . Peripheral arterial disease ([place])    Occluded left superficial femoral artery status post stent June 2016 - Dr. Trula Slade  . Pneumonia 1961  . Polycystic kidney, unspecified type   . Syncope 09/2014    Past Surgical History:  Procedure Laterality Date  . ABDOMINAL AORTOGRAM N/A 08/11/2017   Procedure: ABDOMINAL AORTOGRAM;  Surgeon: Serafina Mitchell, MD;  Location: [place] CV LAB;  Service: Cardiovascular;  Laterality: N/A;  . ANTERIOR CERVICAL DECOMP/DISCECTOMY FUSION    .  APPLICATION OF WOUND VAC Right 08/14/2017   Procedure: APPLICATION OF PREVENA INCISIONAL WOUND VAC RIGHT GROIN;  Surgeon: Serafina Mitchell, MD;  Location: [facility] OR;  Service: Vascular;  Laterality: Right;  . AV FISTULA PLACEMENT Left 09/04/2017   Procedure: ARTERIOVENOUS (AV) FISTULA CREATION LEFT UPPER ARM;  Surgeon: Serafina Mitchell, MD;  Location: [place];  Service: Vascular;  Laterality: Left;  . BACK SURGERY    . BASCILIC VEIN TRANSPOSITION Left 11/20/2017   Procedure: SECOND STAGE BASILIC VEIN TRANSPOSITION LEFT ARM;  Surgeon: Serafina Mitchell, MD;  Location: [place];  Service: Vascular;  Laterality: Left;  . BIOPSY  12/17/2016   Procedure: BIOPSY;  Surgeon: Daneil Dolin, MD;  [facility];  Service: Gastroenterology;;  gastric colon  . BIOPSY  04/29/2017   Procedure: BIOPSY;  Surgeon: Daneil Dolin, MD;  [facility];  Service: Endoscopy;;  duodenal biopsies  . BUNIONECTOMY Bilateral   . COLONOSCOPY  2008   Dr. Oneida Alar: rare sigmoid colon diverticulosis, internal hemorrhoids.   . COLONOSCOPY WITH PROPOFOL N/A 12/17/2016   dense left-sided diverticulosis, right colon ulcers s/p biopsy query occult NSAID use vs transient ischemia, not consistent with IBD. CMV stains negative.   Marland Kitchen ENDARTERECTOMY FEMORAL Right 08/14/2017   Procedure: RIGHT ILLIO-FEMORAL ENDARTERECTOMY;  Surgeon: Serafina Mitchell, MD;  Location: [place];  Service: Vascular;  Laterality: Right;  .  ESOPHAGEAL DILATION  12/17/2016   EGD with mild Schatzki's ring s/p dilatation, small hiatal hernia, erosive gastropathy (negative H.pylori gastritis)  . ESOPHAGOGASTRODUODENOSCOPY  2008   Dr. Oneida Alar: normal esophagus without Barrett's, antritis and duodenitis, path with H.pylori gastritis  . ESOPHAGOGASTRODUODENOSCOPY (EGD) WITH PROPOFOL N/A 12/17/2016   Procedure: ESOPHAGOGASTRODUODENOSCOPY (EGD) WITH PROPOFOL;  Surgeon: Daneil Dolin, MD;  [facility];  Service: Gastroenterology;  Laterality: N/A;  .  ESOPHAGOGASTRODUODENOSCOPY (EGD) WITH PROPOFOL N/A 04/29/2017   Patchy erythema of gastric mucosa diffusely, extensive inflammatory changes in duodenum, geographic ulceration and mucosal edema present, encroaching somewhat on the lumen yet still widely patent, distal second portion of duodenum appeared abnormal, path with peptic duodenitis with ulceration  . GROIN DEBRIDEMENT Right 10/14/2017   Procedure: RIGHT GROIN AND RIGHT LOWER QUADRANT ABDOMEN DEBRIDEMENT WITH PLACEMENT OF ANTIBIOTIC BEADS;  Surgeon: Serafina Mitchell, MD;  Location: [place];  Service: Vascular;  Laterality: Right;  . HERNIA REPAIR  8756   umbilical  . I&D EXTREMITY Right 09/21/2017   Procedure: IRRIGATION AND DEBRIDEMENT GROIN;  Surgeon: Elam Dutch, MD;  Location: [facility] OR;  Service: Vascular;  Laterality: Right;  . I&D EXTREMITY Left 11/23/2017   Procedure: Evacuation Hematoma LEFT UPPER ARM GRAFT;  Surgeon: Elam Dutch, MD;  Location: [place];  Service: Vascular;  Laterality: Left;  . INGUINAL HERNIA REPAIR Right 02/2017   [place]  . INSERTION OF DIALYSIS CATHETER Right 09/04/2017   Procedure: INSERTION OF TUNNELED  DIALYSIS CATHETER - RIGHT INTERNAL JUGULAR PLACEMENT;  Surgeon: Serafina Mitchell, MD;  Location: [place];  Service: Vascular;  Laterality: Right;  . INSERTION OF ILIAC STENT Right 08/14/2017   Procedure: INSERTION OF RIGHT COMMON ILIAC STENT 30mm x 77mm x 130cm INSERTION OF RIGHT EXTERNAL ILIAC STENT 64mm x 24mm x 130cm INSERTION OF SUPERFICIAL FERMORAL ARTERY STENT 13mm x 37mm x 130cm;  Surgeon: Serafina Mitchell, MD;  Location: [facility] OR;  Service: Vascular;  Laterality: Right;  . IR FLUORO GUIDE CV LINE RIGHT  08/31/2017  . IR US GUIDE VASC ACCESS RIGHT  08/31/2017  . LOWER EXTREMITY ANGIOGRAPHY Right 08/11/2017   Procedure: LOWER EXTREMITY ANGIOGRAPHY;  Surgeon: Serafina Mitchell, MD;  Location: [place] CV LAB;  Service: Cardiovascular;  Laterality: Right;  . PATCH ANGIOPLASTY Right 08/14/2017   Procedure: PATCH  ANGIOPLASTY USING HEMASHIELD PATCH 0.3IN Lillie [place];  Surgeon: Serafina Mitchell, MD;  [facility];  Service: Vascular;  Laterality: Right;  . PERIPHERAL VASCULAR CATHETERIZATION N/A 09/20/2014   Procedure: Abdominal Aortogram;  Surgeon: Serafina Mitchell, MD;  Location: [place] CV LAB;  Service: Cardiovascular;  Laterality: N/A;  . POSTERIOR FUSION LUMBAR SPINE      No Known Allergies  Prior to Admission medications   Medication Sig Start Date End Date Taking? Authorizing Provider  B Complex-C-Folic Acid (DIALYVITE 433) 0.8 MG TABS Take 1 tablet by mouth daily. 09/15/17  Yes [provider]  BREO ELLIPTA 200-25 MCG/INH AEPB Inhale 1 puff into the lungs daily as needed (wheezing).  07/27/17  Yes [provider]  calcitRIOL (ROCALTROL) 0.25 MCG capsule Take 1 capsule (0.25 mcg total) by mouth Every Tuesday,Thursday,and Saturday with dialysis. 09/08/17  Yes Ghimire, Henreitta Leber, MD  calcium acetate (PHOSLO) 667 MG capsule Take 667 mg by mouth 3 (three) times daily with meals.  09/15/17  Yes [provider]  carvedilol (COREG) 25 MG tablet Take 1 tablet (25 mg total) 2 (two) times daily by mouth. 02/25/17 12/04/17 Yes Lenze,  Jennye Moccasin, PA-C  cholecalciferol (VITAMIN D) 1000 units tablet Take 2,000 Units by mouth daily.   Yes [provider]  oxyCODONE-acetaminophen (PERCOCET/ROXICET) 5-325 MG tablet Take 1 tablet by mouth every 4 (four) hours as needed for severe pain. Patient taking differently: Take 2 tablets by mouth every 4 (four) hours as needed for severe pain.  11/30/17  Yes Serafina Mitchell, MD  pantoprazole (PROTONIX) 40 MG tablet Take 1 tablet (40 mg total) by mouth 2 (two) times daily. 04/30/17 12/04/17 Yes Shah, Pratik D, DO  warfarin (COUMADIN) 5 MG tablet Take 1 tablet (5 mg total) by mouth daily. Patient taking differently: Take 2.5-5 mg by mouth See admin instructions. 2.5mg  on Friday and 5mg  on all other days 10/21/17 10/21/18 Yes Rhyne, Hulen Shouts, PA-C    cephALEXin (KEFLEX) 500 MG capsule Take 1 capsule (500 mg total) by mouth 3 (three) times daily for 7 days. 11/30/17 12/07/17  Serafina Mitchell, MD  Nutritional Supplements (FEEDING SUPPLEMENT, NEPRO CARB STEADY,) LIQD Take 237 mLs by mouth 2 (two) times daily between meals. Patient not taking: Reported on 12/02/2017 10/21/17   Gabriel Earing, PA-C    Social History   Socioeconomic History  . Marital status: Legally Separated    Spouse name: Not on file  . Number of children: 2  . Years of education: Not on file  . Highest education level: Not on file  Occupational History  . Occupation: DISABLED  Social Needs  . Financial resource strain: Not on file  . Food insecurity:    Worry: Not on file    Inability: Not on file  . Transportation needs:    Medical: Not on file    Non-medical: Not on file  Tobacco Use  . Smoking status: Former Smoker    Packs/day: 2.00    Years: 47.00    Pack years: 94.00    Types: Cigarettes    Last attempt to quit: 08/03/2017    Years since quitting: 0.3  . Smokeless tobacco: Never Used  Substance and Sexual Activity  . Alcohol use: Not Currently    Comment: 10/12/2017 alcohol free since 2017,  heavy drinker in the past  . Drug use: Not Currently    Comment: "<2003 whatever was around; nothing since"  . Sexual activity: Not Currently  Lifestyle  . Physical activity:    Days per week: Not on file    Minutes per session: Not on file  . Stress: Not on file  Relationships  . Social connections:    Talks on phone: Not on file    Gets together: Not on file    Attends religious service: Not on file    Active member of club or organization: Not on file    Attends meetings of clubs or organizations: Not on file    Relationship status: Not on file  . Intimate partner violence:    Fear of current or ex partner: Not on file    Emotionally abused: Not on file    Physically abused: Not on file    Forced sexual activity: Not on file  Other Topics Concern   . Not on file  Social History Narrative   Disabled from Back since 2007     Family History  Problem Relation Age of Onset  . Alcoholism Mother   . Heart disease Father        Massive heart attack  . Heart attack Father   . Atrial fibrillation Father   . Colon cancer  Father   . Colon cancer Maternal Grandfather 18  . Alcoholism Maternal Grandfather   . Renal cancer Cousin   . Ovarian cancer Sister     ROS:  Back pain and neck pain Oozing from left arm incision   Physical Examination  Vitals:   12/06/17 0739 12/06/17 0811  BP: 138/85   Pulse: (!) 112   Resp: 11   Temp:    SpO2: 93% 96%   Body mass index is 24.41 kg/m.  General: No acute distress Pulmonary: normal non-labored breathing, without Rales, rhonchi,  wheezing Cardiac: Palpable thrill left upper arm Musculoskeletal  Left upper arm with palpable thrill 1 cm defect in incision with persistent oozing Neurologic: A&O X 3; Appropriate Affect ; SENSATION: normal; MOTOR FUNCTION:  moving all extremities equally. Speech is fluent/normal   CBC    Component Value Date/Time   WBC 7.6 12/06/2017 0501   RBC 2.59 (L) 12/06/2017 0501   HGB 8.3 (L) 12/06/2017 0501   HCT 24.9 (L) 12/06/2017 0501   PLT 75 (L) 12/06/2017 0501   MCV 96.1 12/06/2017 0501   MCH 32.0 12/06/2017 0501   MCHC 33.3 12/06/2017 0501   RDW 16.1 (H) 12/06/2017 0501   LYMPHSABS 0.5 (L) 12/06/2017 0501   MONOABS 0.6 12/06/2017 0501   EOSABS 0.0 12/06/2017 0501   BASOSABS 0.0 12/06/2017 0501    BMET    Component Value Date/Time   NA 134 (L) 12/06/2017 0501   K 3.9 12/06/2017 0501   CL 94 (L) 12/06/2017 0501   CO2 34 (H) 12/06/2017 0501   GLUCOSE 112 (H) 12/06/2017 0501   BUN 16 12/06/2017 0501   CREATININE 2.40 (H) 12/06/2017 0501   CALCIUM 7.5 (L) 12/06/2017 0501   GFRNONAA 28 (L) 12/06/2017 0501   GFRAA 32 (L) 12/06/2017 0501    COAGS: Lab Results  Component Value Date   INR 1.20 12/06/2017   INR 1.67 12/04/2017   INR 0.93  11/24/2017      ASSESSMENT/PLAN: This is a 62 y.o. male on dialysis currently via catheter.  He has a left upper arm wound that was recently evacuated per hematoma formation after second stage basilic vein transposition fistula.  He is on Coumadin at home and recently fell is split open his wound and has persistent bleeding from the site.  We will plan to take him to the operating room today to evaluate the wound under anesthesia obtain hemostasis and washed out with possible closure versus VAC placement.  I discussed this with him he demonstrates good understanding.  Please keep n.p.o. until after procedure.  Brandon C. Donzetta Matters, MD [facility] Office: [phone] Pager: [phone]

## 2017-12-06 NOTE — Anesthesia Preprocedure Evaluation (Signed)
Anesthesia Evaluation  Patient identified by MRN, date of birth, ID band Patient awake    Reviewed: Allergy & Precautions, NPO status , Patient's Chart, lab work & pertinent test results  Airway Mallampati: I  TM Distance: >3 FB Neck ROM: Full    Dental   Pulmonary COPD, former smoker,    Pulmonary exam normal        Cardiovascular hypertension, Pt. on medications + CAD and + Past MI  Normal cardiovascular exam+ dysrhythmias Atrial Fibrillation      Neuro/Psych Anxiety Depression    GI/Hepatic GERD  Medicated and Controlled,(+) Hepatitis -, C  Endo/Other    Renal/GU ESRF and DialysisRenal disease     Musculoskeletal   Abdominal   Peds  Hematology   Anesthesia Other Findings   Reproductive/Obstetrics                             Anesthesia Physical Anesthesia Plan  ASA: III  Anesthesia Plan: General   Post-op Pain Management:    Induction: Intravenous  PONV Risk Score and Plan: 2 and Ondansetron and Treatment may vary due to age or medical condition  Airway Management Planned: LMA  Additional Equipment:   Intra-op Plan:   Post-operative Plan: Extubation in OR  Informed Consent: I have reviewed the patients History and Physical, chart, labs and discussed the procedure including the risks, benefits and alternatives for the proposed anesthesia with the patient or authorized representative who has indicated his/her understanding and acceptance.     Plan Discussed with: CRNA and Surgeon  Anesthesia Plan Comments:         Anesthesia Quick Evaluation

## 2017-12-06 NOTE — Plan of Care (Signed)
Care plans reviewed and patient is progressing.  

## 2017-12-07 ENCOUNTER — Encounter (HOSPITAL_COMMUNITY): Payer: Self-pay | Admitting: Vascular Surgery

## 2017-12-07 DIAGNOSIS — J449 Chronic obstructive pulmonary disease, unspecified: Secondary | ICD-10-CM

## 2017-12-07 DIAGNOSIS — L0889 Other specified local infections of the skin and subcutaneous tissue: Secondary | ICD-10-CM

## 2017-12-07 DIAGNOSIS — L988 Other specified disorders of the skin and subcutaneous tissue: Secondary | ICD-10-CM

## 2017-12-07 DIAGNOSIS — R7881 Bacteremia: Secondary | ICD-10-CM | POA: Diagnosis present

## 2017-12-07 DIAGNOSIS — Z992 Dependence on renal dialysis: Secondary | ICD-10-CM

## 2017-12-07 DIAGNOSIS — N186 End stage renal disease: Secondary | ICD-10-CM

## 2017-12-07 DIAGNOSIS — I4891 Unspecified atrial fibrillation: Secondary | ICD-10-CM

## 2017-12-07 DIAGNOSIS — I2510 Atherosclerotic heart disease of native coronary artery without angina pectoris: Secondary | ICD-10-CM

## 2017-12-07 DIAGNOSIS — Z87891 Personal history of nicotine dependence: Secondary | ICD-10-CM

## 2017-12-07 DIAGNOSIS — A498 Other bacterial infections of unspecified site: Secondary | ICD-10-CM | POA: Diagnosis present

## 2017-12-07 DIAGNOSIS — Z8619 Personal history of other infectious and parasitic diseases: Secondary | ICD-10-CM

## 2017-12-07 DIAGNOSIS — I471 Supraventricular tachycardia: Secondary | ICD-10-CM

## 2017-12-07 DIAGNOSIS — B9689 Other specified bacterial agents as the cause of diseases classified elsewhere: Secondary | ICD-10-CM

## 2017-12-07 DIAGNOSIS — Z8249 Family history of ischemic heart disease and other diseases of the circulatory system: Secondary | ICD-10-CM

## 2017-12-07 DIAGNOSIS — I252 Old myocardial infarction: Secondary | ICD-10-CM

## 2017-12-07 DIAGNOSIS — I739 Peripheral vascular disease, unspecified: Secondary | ICD-10-CM

## 2017-12-07 LAB — CBC WITH DIFFERENTIAL/PLATELET
ABS IMMATURE GRANULOCYTES: 0.1 10*3/uL (ref 0.0–0.1)
BASOS PCT: 0 %
Basophils Absolute: 0 10*3/uL (ref 0.0–0.1)
EOS ABS: 0 10*3/uL (ref 0.0–0.7)
Eosinophils Relative: 0 %
HCT: 21.9 % — ABNORMAL LOW (ref 39.0–52.0)
Hemoglobin: 7.2 g/dL — ABNORMAL LOW (ref 13.0–17.0)
IMMATURE GRANULOCYTES: 1 %
LYMPHS ABS: 0.5 10*3/uL — AB (ref 0.7–4.0)
Lymphocytes Relative: 7 %
MCH: 32 pg (ref 26.0–34.0)
MCHC: 32.9 g/dL (ref 30.0–36.0)
MCV: 97.3 fL (ref 78.0–100.0)
MONO ABS: 0.5 10*3/uL (ref 0.1–1.0)
MONOS PCT: 7 %
Neutro Abs: 6.2 10*3/uL (ref 1.7–7.7)
Neutrophils Relative %: 85 %
PLATELETS: 68 10*3/uL — AB (ref 150–400)
RBC: 2.25 MIL/uL — ABNORMAL LOW (ref 4.22–5.81)
RDW: 16.2 % — AB (ref 11.5–15.5)
WBC: 7.4 10*3/uL (ref 4.0–10.5)

## 2017-12-07 LAB — CULTURE, BLOOD (ROUTINE X 2): Special Requests: ADEQUATE

## 2017-12-07 LAB — PROTIME-INR
INR: 1.06
PROTHROMBIN TIME: 13.7 s (ref 11.4–15.2)

## 2017-12-07 LAB — BASIC METABOLIC PANEL
ANION GAP: 9 (ref 5–15)
BUN: 30 mg/dL — AB (ref 8–23)
CO2: 28 mmol/L (ref 22–32)
Calcium: 7.2 mg/dL — ABNORMAL LOW (ref 8.9–10.3)
Chloride: 92 mmol/L — ABNORMAL LOW (ref 98–111)
Creatinine, Ser: 3.46 mg/dL — ABNORMAL HIGH (ref 0.61–1.24)
GFR calc Af Amer: 20 mL/min — ABNORMAL LOW (ref >60)
GFR, EST NON AFRICAN AMERICAN: 18 mL/min — AB (ref >60)
GLUCOSE: 140 mg/dL — AB (ref 70–99)
POTASSIUM: 4 mmol/L (ref 3.5–5.1)
Sodium: 129 mmol/L — ABNORMAL LOW (ref 135–145)

## 2017-12-07 LAB — BPAM FFP
BLOOD PRODUCT EXPIRATION DATE: 201908302359
ISSUE DATE / TIME: 201908250132
UNIT TYPE AND RH: 5100

## 2017-12-07 LAB — PREPARE FRESH FROZEN PLASMA: Unit division: 0

## 2017-12-07 LAB — PREPARE RBC (CROSSMATCH)

## 2017-12-07 MED ORDER — SODIUM CHLORIDE 0.9 % IV SOLN
2.0000 g | INTRAVENOUS | Status: DC
  Administered 2017-12-07 – 2017-12-08 (×2): 2 g via INTRAVENOUS
  Filled 2017-12-07 (×2): qty 20

## 2017-12-07 MED ORDER — OXYCODONE-ACETAMINOPHEN 5-325 MG PO TABS
2.0000 | ORAL_TABLET | ORAL | Status: DC | PRN
  Administered 2017-12-07 – 2017-12-08 (×6): 2 via ORAL
  Filled 2017-12-07 (×6): qty 2

## 2017-12-07 MED ORDER — SODIUM CHLORIDE 0.9% IV SOLUTION
Freq: Once | INTRAVENOUS | Status: AC
  Administered 2017-12-07: 19:00:00 via INTRAVENOUS

## 2017-12-07 MED ORDER — SALINE SPRAY 0.65 % NA SOLN
1.0000 | NASAL | Status: DC | PRN
  Administered 2017-12-07: 1 via NASAL
  Filled 2017-12-07: qty 44

## 2017-12-07 MED ORDER — SODIUM CHLORIDE 0.9 % IV BOLUS
250.0000 mL | Freq: Once | INTRAVENOUS | Status: AC
  Administered 2017-12-07: 250 mL via INTRAVENOUS

## 2017-12-07 MED ORDER — CALCITRIOL 0.25 MCG PO CAPS
ORAL_CAPSULE | ORAL | Status: AC
  Filled 2017-12-07: qty 1

## 2017-12-07 MED ORDER — HYDROMORPHONE HCL 1 MG/ML IJ SOLN
1.0000 mg | INTRAMUSCULAR | Status: DC | PRN
  Administered 2017-12-07 – 2017-12-08 (×7): 1 mg via INTRAVENOUS
  Filled 2017-12-07 (×7): qty 1

## 2017-12-07 NOTE — Consult Note (Signed)
[place] for Infectious Disease    Date of Admission:  12/04/2017     Total days of antibiotics 3               Reason for Consult: Serratia Bacteremia    Referring Provider: [facility] Primary Care Provider: [facility]   Assessment/Plan:  Aaron [name] is a 62 year old male with previous medical history of PAD, Paroxysmal atrial tachycardia, MI, ESRD, COPD, CAD, and previous Serratia and Enterococcus soft tissue infection in early July 2019 has Serratia mascercans bacteremia with primary source likely his surgical site with pus encountered during most recent I&D.  He is currently receiving ceftriaxone with hemodialysis. He will require at least 2-4 weeks of antimicrobial therapy. His dialysis catheter appears without any evidence of infection currently. He does continue to have pain but has remained afebrile for the last 48 hours. HIV and Hepatitis C negative from previous blood work.   1. Continue ceftriaxone while in the hospital and will consider changing to ceftazidime for ease of access with dialysis. This would be broader. Will also consider oral medication. 2. Repeat blood cultures in the morning.     Active Problems:   Syncope   Anemia   . sodium chloride   Intravenous Once  . calcitRIOL  0.25 mcg Oral Q T,Th,Sa-HD  . calcium acetate  667 mg Oral TID WC  . carvedilol  25 mg Oral BID  . Chlorhexidine Gluconate Cloth  6 each Topical Q0600  . cholecalciferol  2,000 Units Oral Daily  . epoetin (EPOGEN/PROCRIT) injection  10,000 Units Intravenous Q T,Th,Sa-HD  . ipratropium-albuterol  3 mL Nebulization TID  . multivitamin  1 tablet Oral Daily  . pantoprazole  40 mg Oral Daily  . sodium chloride flush  3 mL Intravenous Q12H     HPI: Aaron [name] is a 62 y.o. male with a previous medical history of PAD, Paroxysmal atrial tachycardia, MI, ESRD, COPD, CAD, and atrial fibrillation who was admitted to the hospital with the chief complaint of  fever, altered mental status and several episodes of falling. Recently underwent second stage basilic vein transposition on 11/20/17 and subsequently developed a hematoma requiring evacuation on 11/23/17. On follow up with vascular surgery there was concern for infected hematoma and started on cephalexin which he completed for about 2 weeks. He then experienced a fall opening his previous surgical site.   Aaron [name] was previously hospitalized with an infected hematoma located in his right groin and found to have Serretia and Enterococcus. He was treated with Ciprofloxacin and Amoxicillin.   In the ED found to have a temperature of 101.3 rectally. There were 2 wounds on the left medial upper arm. Lab work with hyponatremia of 129, GFR of 22, and anemia of 6.5. He was taken to the OR on 8/25 and found the anastomosis area around the fistula to be diseased and friable. The wound near the axilla was draining pus and did not communicate with the wound near the antecubitum. There was no gross HD catheter association. He continues on dialysis. Blood cultures were found to be positive for Serratia marcescens. There is resistance to Cefazolin and he is currently on ceftriaxione.   He has been afebrile for the last 48 hours with no significant leukocytosis. Dr. Donzetta Matters does not believe that these 2 wounds are communicating. He does continue to have pain located in his left arm.   Review of Systems: Review of Systems  Constitutional: Positive for  fever. Negative for chills.  Respiratory: Negative for cough, sputum production, shortness of breath and wheezing.   Cardiovascular: Negative for chest pain and leg swelling.  Gastrointestinal: Negative for abdominal pain, constipation, nausea and vomiting.  Skin: Negative for rash.     Past Medical History:  Diagnosis Date  . Anxiety   . Arthritis    knees , back , shoulders (10/12/2017)  . Atrial fibrillation ([place])   . CAD (coronary artery disease)    Mild  nonobstructive disease at cardiac catheterization 2002  . Chronic back pain    "back of my neck; down thru my legs" (10/12/2017)  . COPD (chronic obstructive pulmonary disease) ([place])   . Diverticulitis   . Esophageal reflux   . ESRD (end stage renal disease) on dialysis [facility])    "TTS; [facility]" (11/23/2017)  . Essential hypertension   . Hepatitis C    states he no longer has this  . History of kidney stones   . History of syncope   . Hyperlipidemia   . Myocardial infarction ([place]) 02/2017   "light one" (10/12/2017)  . PAT (paroxysmal atrial tachycardia) ([place])   . Peripheral arterial disease ([place])    Occluded left superficial femoral artery status post stent June 2016 - Dr. Trula Slade  . Pneumonia 1961  . Polycystic kidney, unspecified type   . Syncope 09/2014    Social History   Tobacco Use  . Smoking status: Former Smoker    Packs/day: 2.00    Years: 47.00    Pack years: 94.00    Types: Cigarettes    Last attempt to quit: 08/03/2017    Years since quitting: 0.3  . Smokeless tobacco: Never Used  Substance Use Topics  . Alcohol use: Not Currently    Comment: 10/12/2017 alcohol free since 2017,  heavy drinker in the past  . Drug use: Not Currently    Comment: "<2003 whatever was around; nothing since"    Family History  Problem Relation Age of Onset  . Alcoholism Mother   . Heart disease Father        Massive heart attack  . Heart attack Father   . Atrial fibrillation Father   . Colon cancer Father   . Colon cancer Maternal Grandfather 76  . Alcoholism Maternal Grandfather   . Renal cancer Cousin   . Ovarian cancer Sister     No Known Allergies   OBJECTIVE: Blood pressure 105/79, pulse 97, temperature 98.1 F (36.7 C), temperature source Oral, resp. rate 18, height 6\' 3"  (1.905 m), weight 88.6 kg, SpO2 96 %.  Physical Exam  Constitutional: He is oriented to person, place, and time. He appears well-developed and well-nourished. No distress.  Cardiovascular: Normal rate,  normal heart sounds and intact distal pulses. An irregularly irregular rhythm present.  Hemodialysis catheter is clean, dry, intact and patent. No signs of infection.   Pulmonary/Chest: Effort normal and breath sounds normal. No stridor. No respiratory distress. He has no wheezes. He has no rales. He exhibits no tenderness.  Neurological: He is alert and oriented to person, place, and time.  Skin: Skin is warm and dry.  Psychiatric: He has a normal mood and affect.    Lab Results Lab Results  Component Value Date   WBC 7.4 12/07/2017   HGB 7.2 (L) 12/07/2017   HCT 21.9 (L) 12/07/2017   MCV 97.3 12/07/2017   PLT 68 (L) 12/07/2017    Lab Results  Component Value Date   CREATININE 3.46 (H) 12/07/2017  BUN 30 (H) 12/07/2017   NA 129 (L) 12/07/2017   K 4.0 12/07/2017   CL 92 (L) 12/07/2017   CO2 28 12/07/2017    Lab Results  Component Value Date   ALT 12 12/05/2017   AST 42 (H) 12/05/2017   ALKPHOS 89 12/05/2017   BILITOT 0.5 12/05/2017     Microbiology: Recent Results (from the past 240 hour(s))  Culture, blood (Routine x 2)     Status: Abnormal   Collection Time: 12/04/17  9:41 PM  Result Value Ref Range Status   Specimen Description   Final    BLOOD RIGHT FOREARM Performed at [facility], [address], [place], [place] [address]    Special Requests   Final    BOTTLES DRAWN AEROBIC AND ANAEROBIC Blood Culture adequate volume Performed at [facility], [address] [place], [place] [address]    Culture  Setup Time   Final    GRAM NEGATIVE RODS Gram Stain Report Called to,Read Back By and Verified With: JACKSON,N. AT 1421 ON 12/05/2017 BY EVA AEROBIC BOTTLE ONLY Performed at [facility]    Culture (A)  Final    SERRATIA MARCESCENS SUSCEPTIBILITIES PERFORMED ON PREVIOUS CULTURE WITHIN THE LAST 5 DAYS. Performed at [facility], [place] [name] [place], [place] [address]    Report Status 12/07/2017 FINAL  Final  Culture, blood (Routine x 2)      Status: Abnormal   Collection Time: 12/04/17 10:06 PM  Result Value Ref Range Status   Specimen Description   Final    BLOOD RIGHT HAND Performed at [facility], [address], [place], [place] [address]    Special Requests   Final    BOTTLES DRAWN AEROBIC AND ANAEROBIC Blood Culture adequate volume Performed at [facility], [address] [place], [place] [address]    Culture  Setup Time   Final    GRAM NEGATIVE RODS Gram Stain Report Called to,Read Back By and Verified With: JACKSON,N. AT 1421 ON 12/05/2017 BY EVA AEROBIC BOTTLE ONLY Performed at [place] (A)  Final   Report Status 12/07/2017 FINAL  Final   Organism ID, Bacteria SERRATIA MARCESCENS  Final      Susceptibility   Serratia marcescens - MIC*    CEFAZOLIN >=64 RESISTANT Resistant     CEFEPIME <=1 SENSITIVE Sensitive     CEFTAZIDIME <=1 SENSITIVE Sensitive     CEFTRIAXONE <=1 SENSITIVE Sensitive     CIPROFLOXACIN <=0.25 SENSITIVE Sensitive     GENTAMICIN <=1 SENSITIVE Sensitive     TRIMETH/SULFA <=20 SENSITIVE Sensitive     * SERRATIA MARCESCENS  Blood Culture ID Panel (Reflexed)     Status: Abnormal   Collection Time: 12/04/17 10:06 PM  Result Value Ref Range Status   Enterococcus species NOT DETECTED NOT DETECTED Final   Listeria monocytogenes NOT DETECTED NOT DETECTED Final   Staphylococcus species NOT DETECTED NOT DETECTED Final   Staphylococcus aureus NOT DETECTED NOT DETECTED Final   Streptococcus species NOT DETECTED NOT DETECTED Final   Streptococcus agalactiae NOT DETECTED NOT DETECTED Final   Streptococcus pneumoniae NOT DETECTED NOT DETECTED Final   Streptococcus pyogenes NOT DETECTED NOT DETECTED Final   Acinetobacter baumannii NOT DETECTED NOT DETECTED Final   Enterobacteriaceae species DETECTED (A) NOT DETECTED Final    Comment: Enterobacteriaceae represent a large family of gram-negative bacteria, not a single organism. CRITICAL RESULT CALLED TO, READ  BACK BY AND VERIFIED WITH: Marissa Nestle,  RN (APH) AT 1810 ON 12/05/17 BY C. JESSUP, MLT.    Enterobacter cloacae complex NOT DETECTED NOT DETECTED Final   Escherichia coli NOT DETECTED NOT DETECTED Final   Klebsiella oxytoca NOT DETECTED NOT DETECTED Final   Klebsiella pneumoniae NOT DETECTED NOT DETECTED Final   Proteus species NOT DETECTED NOT DETECTED Final   Serratia marcescens DETECTED (A) NOT DETECTED Final    Comment: CRITICAL RESULT CALLED TO, READ BACK BY AND VERIFIED WITH: Marissa Nestle, RN (APH) AT 1810 ON 12/05/17 BY C. JESSUP, MLT.    Carbapenem resistance NOT DETECTED NOT DETECTED Final   Haemophilus influenzae NOT DETECTED NOT DETECTED Final   Neisseria meningitidis NOT DETECTED NOT DETECTED Final   Pseudomonas aeruginosa NOT DETECTED NOT DETECTED Final   Candida albicans NOT DETECTED NOT DETECTED Final   Candida glabrata NOT DETECTED NOT DETECTED Final   Candida krusei NOT DETECTED NOT DETECTED Final   Candida parapsilosis NOT DETECTED NOT DETECTED Final   Candida tropicalis NOT DETECTED NOT DETECTED Final    Comment: Performed at [facility], [place] [address] [place], [place] [address]  Aerobic/Anaerobic Culture (surgical/deep wound)     Status: None (Preliminary result)   Collection Time: 12/06/17 11:21 AM  Result Value Ref Range Status   Specimen Description WOUND LEFT UPPER ARM  Final   Special Requests PATIENT ON FOLLOWING ROCEPHIN  Final   Gram Stain   Final    NO WBC SEEN NO ORGANISMS SEEN Performed at [facility], [address] [place],  [address]    Culture PENDING  Incomplete   Report Status PENDING  Incomplete     Terri Piedra, NP [place] [facility] [phone] Pager  12/07/2017  10:50 AM

## 2017-12-07 NOTE — Procedures (Signed)
Patient seen on Hemodialysis. QB 400, UF goal 3L Treatment adjusted as needed.  Elmarie Shiley MD [facility]. Office # [phone] Pager # [phone] 12:30 PM

## 2017-12-07 NOTE — Progress Notes (Signed)
PROGRESS NOTE   Aaron [name]  GGY:[id number]    DOB: April 20, 1955    DOA: 12/04/2017  PCP: [facility]   I have briefly reviewed patients previous medical records in [facility]  Brief Narrative:  62 year old male with PMH of atrial fibrillation on chronic Coumadin, mild nonobstructive CAD by cardiac cath 2002, chronic back pain (has had neck and lumbar spine surgery), COPD, GERD, ESRD on TTS HD, HTN, HLD, PAD, recent hematoma evacuation of the left upper extremity second stage basilic vein transposition and started on Keflex for possible infected hematoma, sustained fall day prior to admission with syncope and split open his left upper arm incision with associated bleeding and initially presented to the [facility] with hemoglobin was 6.5 (was 7.9 four days prior).  INR was 1.67.  He was transfused 2 units of PRBCs.  He continued to have bleeding from left upper extremity AV fistula, coagulopathy was reversed with 2 units of FFP and 5 mg of IV vitamin K.  He was transferred to [facility] for vascular surgery evaluation.  He underwent exploration of left upper extremity wounds with washout and ligation of basilic vein AV fistula.  He now has Serratia marcescens bacteremia and is on empiric IV ceftriaxone.  ID consulted 8/26.   Assessment & Plan:   Active Problems:   Syncope   Anemia   Acute blood loss anemia due to traumatic injury to left upper extremity fistula, complicating anemia of ESRD: Status post 2 units PRBC transfusion at [place] of bleeding has been repaired by vascular surgery.  Hemoglobin has steadily dropped from 8.3 yesterday to 7.2 today.  Discussed with nephrology and requested 1 unit PRBC across HD this morning.  ESRD on TTS HD: Looks like patient went HD on 8/24 via right HD catheter.  Reportedly noncompliant with dialysis the last 2 weeks.  Nephrology consulted for dialysis needs.  Reportedly going for HD this  morning.  Serratia marcescens bacteremia: 1 of 2 blood cultures from 8/23+.  Sensitive to ceftriaxone.  Possible sources include recent infected hematoma evacuation/left upper extremity cellulitis, HD catheter.  Continue IV ceftriaxone per pharmacy pending final culture results.  Decision to be made regarding HD catheter removal.  Nephrology aware.  I consulted ID on 8/26 to assist with any other management decisions (i.e. HD catheter removal), choice and duration of antibiotics.  Essential hypertension: Controlled today.  Continue carvedilol.  Thrombocytopenia: May be multifactorial related to acute blood loss, bacteremia and acute illness.  CBC is relatively stable over the last 24 hours, 68 this morning.  Follow CBCs closely.  No platelet transfusion indicated at this time.  Paroxysmal atrial fibrillation, now RVR: Patient initially presented with sinus rhythm but went into A. fib with RVR on 8/24 at around 3:18 PM.  Likely precipitated by acute illness (infection, pain, bleeding, anemia).  Continue carvedilol 25 mg twice daily, PRN IV metoprolol.  If RVR persists, consider IV diltiazem drip.  TTE 08/25/2017: LVEF 55-60%.  Coumadin on hold and INR reversed due to bleeding issues as discussed.  Await vascular surgery input regarding resumption of anticoagulation.  Ventricular rate better controlled and mostly in the 90s-100s overnight and this morning.  COPD: No clinical bronchospasm.  Fall:?  Related to hypotension/orthostatic changes from acute blood loss anemia and bacteremia.  PT and OT evaluation.  Syncope: Patient reported that he passed out after he fell to the ground on his back.  Again may be related to hypotension/orthostatic changes or  vasovagal.  Check orthostatic blood pressures.  Subacute on chronic low back pain: No deformities noted.  Precipitated by fall.  Judicious use of pain medicines.  Did not complain of back pain today.  Hyponatremia: Related to ESRD and dialysis.  Stable.   Management per nephrology.   DVT prophylaxis: SCDs Code Status: Full Family Communication: None at bedside Disposition: DC home pending clinical improvement and medical stability.  May take several days.   Consultants:  Vascular surgery Nephrology Infectious disease  Procedures:  Exploration of left upper extremity wounds with washout and ligation of basilic vein AV fistula by vascular surgery on 8/25. HD  Antimicrobials:  IV ceftriaxone   Subjective: Overall feels better.  Mild pain at left upper extremity operated site.  No back or neck pain reported today.  No chest pain, dyspnea or palpitations.  ROS: As above otherwise negative.  Indicates that he has hardware in his neck from prior surgery.   Objective:  Vitals:   12/07/17 0400 12/07/17 0431 12/07/17 0801 12/07/17 0834  BP: (!) 83/57 98/62  105/79  Pulse: 99   97  Resp: 19   18  Temp: 98.2 F (36.8 C)   98.1 F (36.7 C)  TempSrc: Oral   Oral  SpO2: 96%  94% 96%  Weight:      Height:        Examination:  General exam: Middle-age male, small built and frail, lying comfortably propped up in bed.  Did not appear in any distress. Respiratory system: Slightly harsh breath sounds and occasional rhonchi posteriorly but otherwise clear to auscultation.  Right IJ HD catheter site intact without acute findings of findings suggestive of infection. Cardiovascular system: S1 & S2 heard, irregularly irregular and tachycardic. No JVD, murmurs, rubs, gallops or clicks. No pedal edema.  Telemetry personally reviewed: A. fib with ventricular rate in the 90s-100s.  No further NSVT noted. Gastrointestinal system: Abdomen is nondistended, soft and nontender. No organomegaly or masses felt. Normal bowel sounds heard.  Stable Central nervous system: Alert and oriented. No focal neurological deficits.  Stable Extremities: Symmetric 5 x 5 power.  Left upper arm surgical site dressing clean and dry.  Left forearm with mild pitting  edema. Skin: No rashes, lesions or ulcers Psychiatry: Judgement and insight appear normal. Mood & affect appropriate. Musculoskeletal: On 8/25, examined patient's back in detail by turning him over with assistance.  No deformity, point tenderness or spasms noted.    Data Reviewed: I have personally reviewed following labs and imaging studies  CBC: Recent Labs  Lab 12/01/17 1412  12/04/17 2205 12/05/17 0503 12/06/17 0501 12/06/17 1621 12/06/17 2144 12/07/17 0534  WBC 6.9   < > 5.4 3.4* 7.6 6.5 10.0 7.4  NEUTROABS 5.5  --  4.7  --  6.5 5.9  --  6.2  HGB 8.8*   < > 6.5* 7.2* 8.3* 7.5* 8.3* 7.2*  HCT 27.0*   < > 18.8* 21.4* 24.9* 22.5* 25.4* 21.9*  MCV 100.7*   < > 95.4 96.8 96.1 96.2 97.7 97.3  PLT 221   < > 108* 96* 75* 59* 70* 68*   < > = values in this interval not displayed.   Basic Metabolic Panel: Recent Labs  Lab 12/02/17 1443 12/04/17 2205 12/05/17 0503 12/06/17 0501 12/07/17 0534  NA 130* 129* 130* 134* 129*  K 4.9 3.8 3.9 3.9 4.0  CL 101 95* 97* 94* 92*  CO2 22 25 27  34* 28  GLUCOSE 102* 124* 128* 112* 140*  BUN  39* 25* 32* 16 30*  CREATININE 4.49* 2.85* 3.15* 2.40* 3.46*  CALCIUM 8.0* 6.8* 6.8* 7.5* 7.2*   Liver Function Tests: Recent Labs  Lab 12/04/17 2205 12/05/17 0503  AST 46* 42*  ALT 10 12  ALKPHOS 86 89  BILITOT 0.6 0.5  PROT 5.0* 5.2*  ALBUMIN 2.2* 2.2*   Coagulation Profile: Recent Labs  Lab 12/04/17 2205 12/06/17 0501 12/07/17 0534  INR 1.67 1.20 1.06   Cardiac Enzymes: Recent Labs  Lab 12/05/17 0502  TROPONINI 0.04*   HbA1C: No results for input(s): HGBA1C in the last 72 hours. CBG: Recent Labs  Lab 12/02/17 1447  GLUCAP 86    Recent Results (from the past 240 hour(s))  Culture, blood (Routine x 2)     Status: Abnormal   Collection Time: 12/04/17  9:41 PM  Result Value Ref Range Status   Specimen Description   Final    BLOOD RIGHT FOREARM Performed at [facility], [address], [place], [place] [address]     Special Requests   Final    BOTTLES DRAWN AEROBIC AND ANAEROBIC Blood Culture adequate volume Performed at [facility], [address], [place], [place] [address]    Culture  Setup Time   Final    GRAM NEGATIVE RODS Gram Stain Report Called to,Read Back By and Verified With: JACKSON,N. AT 1421 ON 12/05/2017 BY EVA AEROBIC BOTTLE ONLY Performed at [facility]    Culture (A)  Final    SERRATIA MARCESCENS SUSCEPTIBILITIES PERFORMED ON PREVIOUS CULTURE WITHIN THE LAST 5 DAYS. Performed at [facility], [place] [address] [place], [place] [address]    Report Status 12/07/2017 FINAL  Final  Culture, blood (Routine x 2)     Status: Abnormal   Collection Time: 12/04/17 10:06 PM  Result Value Ref Range Status   Specimen Description   Final    BLOOD RIGHT HAND Performed at [facility], [address], [place], [place] [address]    Special Requests   Final    BOTTLES DRAWN AEROBIC AND ANAEROBIC Blood Culture adequate volume Performed at [place] [place],  [address]    Culture  Setup Time   Final    GRAM NEGATIVE RODS Gram Stain Report Called to,Read Back By and Verified With: JACKSON,N. AT 1421 ON 12/05/2017 BY EVA AEROBIC BOTTLE ONLY Performed at [place] (A)  Final   Report Status 12/07/2017 FINAL  Final   Organism ID, Bacteria SERRATIA MARCESCENS  Final      Susceptibility   Serratia marcescens - MIC*    CEFAZOLIN >=64 RESISTANT Resistant     CEFEPIME <=1 SENSITIVE Sensitive     CEFTAZIDIME <=1 SENSITIVE Sensitive     CEFTRIAXONE <=1 SENSITIVE Sensitive     CIPROFLOXACIN <=0.25 SENSITIVE Sensitive     GENTAMICIN <=1 SENSITIVE Sensitive     TRIMETH/SULFA <=20 SENSITIVE Sensitive     * SERRATIA MARCESCENS  Blood Culture ID Panel (Reflexed)     Status: Abnormal   Collection Time: 12/04/17 10:06 PM  Result Value Ref Range Status   Enterococcus species NOT DETECTED NOT DETECTED Final   Listeria  monocytogenes NOT DETECTED NOT DETECTED Final   Staphylococcus species NOT DETECTED NOT DETECTED Final   Staphylococcus aureus NOT DETECTED NOT DETECTED Final   Streptococcus species NOT DETECTED NOT DETECTED Final   Streptococcus agalactiae NOT DETECTED NOT DETECTED Final   Streptococcus pneumoniae NOT DETECTED NOT DETECTED Final  Streptococcus pyogenes NOT DETECTED NOT DETECTED Final   Acinetobacter baumannii NOT DETECTED NOT DETECTED Final   Enterobacteriaceae species DETECTED (A) NOT DETECTED Final    Comment: Enterobacteriaceae represent a large family of gram-negative bacteria, not a single organism. CRITICAL RESULT CALLED TO, READ BACK BY AND VERIFIED WITH: Marissa Nestle, RN (APH) AT 1810 ON 12/05/17 BY C. JESSUP, MLT.    Enterobacter cloacae complex NOT DETECTED NOT DETECTED Final   Escherichia coli NOT DETECTED NOT DETECTED Final   Klebsiella oxytoca NOT DETECTED NOT DETECTED Final   Klebsiella pneumoniae NOT DETECTED NOT DETECTED Final   Proteus species NOT DETECTED NOT DETECTED Final   Serratia marcescens DETECTED (A) NOT DETECTED Final    Comment: CRITICAL RESULT CALLED TO, READ BACK BY AND VERIFIED WITH: Marissa Nestle, RN (APH) AT 1810 ON 12/05/17 BY C. JESSUP, MLT.    Carbapenem resistance NOT DETECTED NOT DETECTED Final   Haemophilus influenzae NOT DETECTED NOT DETECTED Final   Neisseria meningitidis NOT DETECTED NOT DETECTED Final   Pseudomonas aeruginosa NOT DETECTED NOT DETECTED Final   Candida albicans NOT DETECTED NOT DETECTED Final   Candida glabrata NOT DETECTED NOT DETECTED Final   Candida krusei NOT DETECTED NOT DETECTED Final   Candida parapsilosis NOT DETECTED NOT DETECTED Final   Candida tropicalis NOT DETECTED NOT DETECTED Final    Comment: Performed at [facility], [place] [address], [place], [place] [address]  Aerobic/Anaerobic Culture (surgical/deep wound)     Status: None (Preliminary result)   Collection Time: 12/06/17 11:21 AM  Result Value Ref Range  Status   Specimen Description WOUND LEFT UPPER ARM  Final   Special Requests PATIENT ON FOLLOWING ROCEPHIN  Final   Gram Stain   Final    NO WBC SEEN NO ORGANISMS SEEN Performed at [facility], [address], [place], [place] [address]    Culture PENDING  Incomplete   Report Status PENDING  Incomplete         Radiology Studies: No results found.      Scheduled Meds: . sodium chloride   Intravenous Once  . calcitRIOL  0.25 mcg Oral Q T,Th,Sa-HD  . calcium acetate  667 mg Oral TID WC  . carvedilol  25 mg Oral BID  . Chlorhexidine Gluconate Cloth  6 each Topical Q0600  . cholecalciferol  2,000 Units Oral Daily  . epoetin (EPOGEN/PROCRIT) injection  10,000 Units Intravenous Q T,Th,Sa-HD  . ipratropium-albuterol  3 mL Nebulization TID  . multivitamin  1 tablet Oral Daily  . pantoprazole  40 mg Oral Daily  . sodium chloride flush  3 mL Intravenous Q12H   Continuous Infusions: . sodium chloride    . sodium chloride    . sodium chloride 100 mL (12/05/17 2140)  . cefTRIAXone (ROCEPHIN)  IV       LOS: 1 day     Vernell Leep, MD, FACP, [facility]. [facility] Pager [phone]  If 7PM-7AM, please contact night-coverage [email or web address] Password [facility] 12/07/2017, 11:34 AM

## 2017-12-07 NOTE — Progress Notes (Signed)
Pt's BP was 82/60 at around 20:50. Gave a PRN 100 mL bolus. The latest BP was 99/79.  The on call Hospitalist was paged and asked if it was fine to give the scheduled Coreg 25 mg. The said not to give the Coreg since the SBP is < 100, and ordered a one time 250 mL bolus of normal saline IV.  Will continue to monitor pt.  Lupita Dawn, RN

## 2017-12-07 NOTE — Progress Notes (Addendum)
  Progress Note    12/07/2017 7:31 AM 1 Day Post-Op  Subjective:  Wants some pain med for dressing change.  Wants to know if he can go home today.  Tm 99.3 now afebrile HR 90's-120's Afib 51'W-258'N systolic 27% 7.8EU2PN  Vitals:   12/07/17 0400 12/07/17 0431  BP: (!) 83/57 98/62  Pulse: 99   Resp: 19   Temp: 98.2 F (36.8 C)   SpO2: 96%     Physical Exam: Cardiac:  irregular Lungs:  Non labored Incisions:    Proximal left arm wound 12/07/17  Distal left arm wound 12/07/17.  Extremities:  +palpable left radial pulse   CBC    Component Value Date/Time   WBC 7.4 12/07/2017 0534   RBC 2.25 (L) 12/07/2017 0534   HGB 7.2 (L) 12/07/2017 0534   HCT 21.9 (L) 12/07/2017 0534   PLT 68 (L) 12/07/2017 0534   MCV 97.3 12/07/2017 0534   MCH 32.0 12/07/2017 0534   MCHC 32.9 12/07/2017 0534   RDW 16.2 (H) 12/07/2017 0534   LYMPHSABS 0.5 (L) 12/07/2017 0534   MONOABS 0.5 12/07/2017 0534   EOSABS 0.0 12/07/2017 0534   BASOSABS 0.0 12/07/2017 0534    BMET    Component Value Date/Time   NA 129 (L) 12/07/2017 0534   K 4.0 12/07/2017 0534   CL 92 (L) 12/07/2017 0534   CO2 28 12/07/2017 0534   GLUCOSE 140 (H) 12/07/2017 0534   BUN 30 (H) 12/07/2017 0534   CREATININE 3.46 (H) 12/07/2017 0534   CALCIUM 7.2 (L) 12/07/2017 0534   GFRNONAA 18 (L) 12/07/2017 0534   GFRAA 20 (L) 12/07/2017 0534    INR    Component Value Date/Time   INR 1.06 12/07/2017 0534     Intake/Output Summary (Last 24 hours) at 12/07/2017 0731 Last data filed at 12/06/2017 2126 Gross per 24 hour  Intake 1740 ml  Output 600 ml  Net 1140 ml     Assessment:  62 y.o. male is s/p:  Exploration of left upper extremity wounds with washout and ligation of basilic vein AV fistula  1 Day Post-Op  Plan: -dressing changed with wet to dry dressing change this am.  (Pics above).  No active bleeding. -continue IV abx given there was purulent material in the proximal wound in the OR.  Dr. Donzetta Matters does not  feel these two wounds communicate.  -DVT prophylaxis:  Will d/w Dr. Trula Slade about anticoagulation given his afib and recent bleed with fistula.   -acute blood loss anemia-may need transfusion-will defer to primary team.    Leontine Locket, PA-C [facility] [phone] 12/07/2017 7:31 AM   I agree with the above.  He is not ready to go home,as he feels he is too weak.  He wants more pain meds at night.  [employer]

## 2017-12-07 NOTE — Progress Notes (Signed)
Patient ID: Aaron [name], male   DOB: 12/24/1955, 62 y.o.   MRN: [id number]  [facility] Progress Note   Assessment/ Plan:   1.  Left upper extremity wounds: Status post washout/irrigation with ligation of basilic vein fistula.  Images and surgical notes reviewed from earlier today. 2. ESRD: Continue hemodialysis on a Monday/Wednesday/Friday schedule-dialysis today with blood transfusion. 3. Anemia: Significant hemoglobin drop noted, will transfuse 2 units PRBCs at hemodialysis today. 4. CKD-MBD: Continue calcium acetate with meals 3 times a day for phosphorus binding, will trend calcium and phosphorus. 5. Nutrition: Likely low albumin associated with recent problems with fistula/wounds-continue nutritional supplementation. 6. Hypertension: Blood pressures currently under good control, monitor with ongoing treatment/hemodialysis.  Subjective:   Reports to be feeling fair and "may need to go to a rest home to get energy up".    Objective:   BP 105/79 (BP Location: Right Arm)   Pulse 97   Temp 98.1 F (36.7 C) (Oral)   Resp 18   Ht 6\' 3"  (1.905 m)   Wt 88.6 kg   SpO2 96%   BMI 24.41 kg/m   Physical Exam: Gen: Appears comfortable when seen on HD CVS: Pulse regular rhythm, normal rate, S1 and S2 normal. RIJ TDC Resp: Clear to auscultation, no rales/rhonchi Abd: Soft, flat, nontender Ext: Mild lower extremity edema  Labs: BMET Recent Labs  Lab 12/01/17 1412 12/02/17 1443 12/04/17 2205 12/05/17 0503 12/06/17 0501 12/07/17 0534  NA 135 130* 129* 130* 134* 129*  K 4.7 4.9 3.8 3.9 3.9 4.0  CL 105 101 95* 97* 94* 92*  CO2 23 22 25 27  34* 28  GLUCOSE 131* 102* 124* 128* 112* 140*  BUN 44* 39* 25* 32* 16 30*  CREATININE 5.01* 4.49* 2.85* 3.15* 2.40* 3.46*  CALCIUM 8.3* 8.0* 6.8* 6.8* 7.5* 7.2*   CBC Recent Labs  Lab 12/04/17 2205  12/06/17 0501 12/06/17 1621 12/06/17 2144 12/07/17 0534  WBC 5.4   < > 7.6 6.5 10.0 7.4  NEUTROABS 4.7  --  6.5 5.9  --   6.2  HGB 6.5*   < > 8.3* 7.5* 8.3* 7.2*  HCT 18.8*   < > 24.9* 22.5* 25.4* 21.9*  MCV 95.4   < > 96.1 96.2 97.7 97.3  PLT 108*   < > 75* 59* 70* 68*   < > = values in this interval not displayed.   Medications:    . sodium chloride   Intravenous Once  . calcitRIOL  0.25 mcg Oral Q T,Th,Sa-HD  . calcium acetate  667 mg Oral TID WC  . carvedilol  25 mg Oral BID  . Chlorhexidine Gluconate Cloth  6 each Topical Q0600  . cholecalciferol  2,000 Units Oral Daily  . epoetin (EPOGEN/PROCRIT) injection  10,000 Units Intravenous Q T,Th,Sa-HD  . ipratropium-albuterol  3 mL Nebulization TID  . multivitamin  1 tablet Oral Daily  . pantoprazole  40 mg Oral Daily  . sodium chloride flush  3 mL Intravenous Q12H   Elmarie Shiley, MD 12/07/2017, 11:38 AM

## 2017-12-08 DIAGNOSIS — D631 Anemia in chronic kidney disease: Secondary | ICD-10-CM

## 2017-12-08 DIAGNOSIS — A498 Other bacterial infections of unspecified site: Secondary | ICD-10-CM

## 2017-12-08 LAB — PROTIME-INR
INR: 1.07
PROTHROMBIN TIME: 13.8 s (ref 11.4–15.2)

## 2017-12-08 LAB — BASIC METABOLIC PANEL
Anion gap: 10 (ref 5–15)
BUN: 21 mg/dL (ref 8–23)
CO2: 27 mmol/L (ref 22–32)
CREATININE: 2.55 mg/dL — AB (ref 0.61–1.24)
Calcium: 7.6 mg/dL — ABNORMAL LOW (ref 8.9–10.3)
Chloride: 98 mmol/L (ref 98–111)
GFR calc Af Amer: 30 mL/min — ABNORMAL LOW (ref >60)
GFR, EST NON AFRICAN AMERICAN: 26 mL/min — AB (ref >60)
GLUCOSE: 103 mg/dL — AB (ref 70–99)
POTASSIUM: 4.2 mmol/L (ref 3.5–5.1)
Sodium: 135 mmol/L (ref 135–145)

## 2017-12-08 LAB — CBC
HCT: 31.3 % — ABNORMAL LOW (ref 39.0–52.0)
Hemoglobin: 10.3 g/dL — ABNORMAL LOW (ref 13.0–17.0)
MCH: 31.1 pg (ref 26.0–34.0)
MCHC: 32.9 g/dL (ref 30.0–36.0)
MCV: 94.6 fL (ref 78.0–100.0)
Platelets: 79 10*3/uL — ABNORMAL LOW (ref 150–400)
RBC: 3.31 MIL/uL — AB (ref 4.22–5.81)
RDW: 17.2 % — AB (ref 11.5–15.5)
WBC: 12.5 10*3/uL — ABNORMAL HIGH (ref 4.0–10.5)

## 2017-12-08 LAB — BPAM RBC
BLOOD PRODUCT EXPIRATION DATE: 201909242359
Blood Product Expiration Date: 201909232359
ISSUE DATE / TIME: 201908261408
ISSUE DATE / TIME: 201908261558
UNIT TYPE AND RH: 5100
Unit Type and Rh: 5100

## 2017-12-08 LAB — TYPE AND SCREEN
ABO/RH(D): O POS
ANTIBODY SCREEN: NEGATIVE
UNIT DIVISION: 0
Unit division: 0

## 2017-12-08 LAB — CULTURE, BLOOD (ROUTINE X 2): Special Requests: ADEQUATE

## 2017-12-08 MED ORDER — OXYCODONE-ACETAMINOPHEN 5-325 MG PO TABS: 1.0000 | ORAL_TABLET | ORAL | 0 refills | Status: AC | PRN

## 2017-12-08 MED ORDER — SODIUM CHLORIDE 0.9 % IV SOLN
2.0000 g | INTRAVENOUS | Status: AC
  Administered 2017-12-08: 2 g via INTRAVENOUS
  Filled 2017-12-08: qty 2

## 2017-12-08 MED ORDER — SODIUM CHLORIDE 0.9 % IV SOLN: 2.0000 g | INTRAVENOUS | Status: DC

## 2017-12-08 NOTE — Care Management Note (Addendum)
Case Management Note Marvetta Gibbons RN, BSN Unit 4E- RN Care Coordinator  [phone]  Patient Details  Name: Aaron [name] MRN: [id number] Date of Birth: May 17, 1955  Subjective/Objective:  Pt admitted with bleeding from L arm AVF  s/p Exploration of left upper extremity wounds with washout and ligation of basilic vein AV fistula                    Action/Plan: PTA pt lived at home alone, PT/OT evals pending- call received from bedside RN Katharine Look required DME needs- pt needs RW and 3n1 for transition home, call made to [place] with [facility]- and checked to see if pt had received these in past- pt has not- orders have been placed- and [place] with [facility] aware of DME needs- RW and 3n1 to be delivered to room prior to discharge. Pt will also need HHRN for wound care- spoke with pt at bedside- pt has used [facility] in past for [facility] needs- wants to use them again- states that his ex-wife and sister will be able to assist with wound care. Call made to Butch Penny for [facility] referral- referral has been accepted.   Expected Discharge Date:  12/08/17               Expected Discharge Plan:  Home/Self Care  In-House Referral:  NA  Discharge planning Services  CM Consult  Post Acute Care Choice:  Durable Medical Equipment, Home Health Choice offered to:  Patient  DME Arranged:  3-N-1, [place] rolling DME Agency:  [place]:  RN [facility] Agency:  [place]  Status of Service:  Completed, signed off  If discussed at [place] of Stay Meetings, dates discussed:    Discharge Disposition: home/home health   Additional Comments:  Dawayne Patricia, RN 12/08/2017, 2:47 PM

## 2017-12-08 NOTE — Discharge Summary (Signed)
Physician Discharge Summary  Patient ID: Aaron [name] MRN: [id number] DOB/AGE: 18-May-1955 62 y.o.  Admit date: 12/04/2017 Discharge date: 12/08/2017  Admission Diagnoses:  Bleeding L arm AVF  Bacteremia  AVF infection  Paroxysmal atrial fib  ABL anemia  Anemia of ckd  ESRD on HD    Discharge Diagnoses:   Bleeding L arm AVF  Serratia wound infection  Serratia bacteremia  AVF infection sp ligation  Paroxysmal atrial fib  ABL anemia  Anemia of ckd  ESRD on HD TTS  Thrombocytopenia   HTN  Discharged Condition: good  Hospital Course: with PMH of atrial fibrillation on chronic Coumadin, mild nonobstructive CAD by cardiac cath 2002, chronic back pain (has had neck and lumbar spine surgery), COPD, GERD, ESRD on TTS HD, HTN, HLD, PAD, recent hematoma evacuation of the left upper extremity second stage basilic vein transposition and started on Keflex for possible infected hematoma, sustained fall day prior to admission with syncope and split open his left upper arm incision with associated bleeding and initially presented to the [facility] with hemoglobin was 6.5 (was 7.9 four days prior).  INR was 1.67.  He was transfused 2 units of PRBCs.  He continued to have bleeding from left upper extremity AV fistula, coagulopathy was reversed with 2 units of FFP and 5 mg of IV vitamin K.  He was transferred to [facility] for vascular surgery evaluation.  He underwent exploration of left upper extremity wounds with washout and ligation of basilic vein AV fistula. There was gross purulence in OR noted. He now has Serratia marcescens bacteremia and is on empiric IV ceftriaxone.  ID consulted 8/26.    Assessment/ Plans:  Serratia marcescens wound infection and bacteremia: Pt had I&D and washout of infected/ bleeding L arm AVF wound w/ ligation of AV fistula on day of admission 12/06/17 by Dr Donzetta Matters [facility]VS.  The wound cx from the surgery on 8/25 was + for Serratia. The blood cx's from ED on 8/23 two days  prior were also both + for Serratia.   The Serratia was sensitive to Rocephin which he rec'd for 5 days in the hospital.  On dc pt will get IV Fortaz at OP HD unit to complete a 3 week course (per my phone conversation w/ ID this am) last dose on 12/29/17.  OUtpatient HD unit has been notified this am.   Acute blood loss anemia due to fall and infection of LUE fistula, complicating anemia of ESRD: Status post 2 units PRBC transfusion at [place] of bleeding has been repaired by vascular surg. Total prbc's here was 4u prbc's. Hb up to 10.3 today on dc.    ESRD on TTS HD: [facility] T[facility].  AVF ligated now, using TDC which is working well.  AVF is no longer working as it was ligated.  [facility] working well , had HD on Monday here 8/26 w/ o difficulty.  Spoke w/ outpt HD in [place] , they will expect patient next on Thursday 8/29 and will give IV Fortaz 2 gm 3x per week after HD thru 12/29/17.    Essential hypertension: Continue carvedilol.  Thrombocytopenia: May be multifactorial related to acute blood loss, bacteremia and acute illness.  Improving at time of dc, up to 79k.    Paroxysmal atrial fibrillation: rate controlled at dc. Coumadin/ all anticoag was held due to L arm bleeding while here. Per vasc surg is OK to resume coumadin at dc today.  Pt will resume taking his  coumadin at prior home dose.    COPD: No clinical bronchospasm.  Fall: prob Related to hypotension/orthostatic changes from acute blood loss anemia and bacteremia.  Better, able to get up w/ walker.  Offered PT eval this am for home PT eval but patient adamant about leaving "at 12 noon today" so this was not possible.   Syncope: Patient reported that he passed out after he fell to the ground on his back.  no further episodes in hospital , prob combination of sepsis / blood loss/ anemia.   Subacute on chronic low back pain: No deformities noted.  Precipitated by fall.  Judicious use of pain  medicines.  Hyponatremia: Related to ESRD and dialysis.  Stable.  Management per nephrology.      Consultants:  Vascular surgery Nephrology Infectious disease  Procedures:  Exploration of left upper extremity wounds with washout and ligation of basilic vein AV fistula by vascular surgery on 8/25. HD  Antimicrobials:  IV ceftriaxone   Discharge Exam: Blood pressure 106/82, pulse 97, temperature 98.3 F (36.8 C), resp. rate 19, height 6\' 3"  (1.905 m), weight 90 kg, SpO2 96 %. General exam: Middle-age male, small built and frail, lying comfortably propped up in bed.  Did not appear in any distress. Respiratory system: Slightly harsh breath sounds and occasional rhonchi posteriorly but otherwise clear to auscultation.  Right IJ HD catheter site intact without acute findings of findings suggestive of infection. Cardiovascular system: S1 & S2 heard, irregularly irregular and tachycardic. No JVD, murmurs, rubs, gallops or clicks. No pedal edema.  Telemetry personally reviewed: A. fib with ventricular rate in the 90s-100s.  No further NSVT noted. Gastrointestinal system: Abdomen is nondistended, soft and nontender. No organomegaly or masses felt. Normal bowel sounds heard.  Stable Central nervous system: Alert and oriented. No focal neurological deficits.  Stable Extremities: Symmetric 5 x 5 power.  Left upper arm surgical site dressing clean and dry.  Left forearm with mild pitting edema. Skin: No rashes, lesions or ulcers Psychiatry: Judgement and insight appear normal. Mood & affect appropriate.   Disposition:  Discharge disposition: 01-Home or Self Care        Allergies as of 12/08/2017   No Known Allergies     Medication List    STOP taking these medications   cephALEXin 500 MG capsule Commonly known as:  KEFLEX     TAKE these medications   BREO ELLIPTA 200-25 MCG/INH Aepb Generic drug:  fluticasone furoate-vilanterol Inhale 1 puff into the lungs daily as  needed (wheezing).   calcitRIOL 0.25 MCG capsule Commonly known as:  ROCALTROL Take 1 capsule (0.25 mcg total) by mouth Every Tuesday,Thursday,and Saturday with dialysis.   calcium acetate 667 MG capsule Commonly known as:  PHOSLO Take 667 mg by mouth 3 (three) times daily with meals.   carvedilol 25 MG tablet Commonly known as:  COREG Take 1 tablet (25 mg total) 2 (two) times daily by mouth.   cholecalciferol 1000 units tablet Commonly known as:  VITAMIN D Take 2,000 Units by mouth daily.   DIALYVITE 800 0.8 MG Tabs Take 1 tablet by mouth daily.   feeding supplement (NEPRO CARB STEADY) Liqd Take 237 mLs by mouth 2 (two) times daily between meals.   oxyCODONE-acetaminophen 5-325 MG tablet Commonly known as:  PERCOCET/ROXICET Take 1-2 tablets by mouth every 4 (four) hours as needed for up to 7 days for moderate pain. What changed:    how much to take  reasons to take this   pantoprazole  40 MG tablet Commonly known as:  PROTONIX Take 1 tablet (40 mg total) by mouth 2 (two) times daily.   warfarin 5 MG tablet Commonly known as:  COUMADIN Take as directed. If you are unsure how to take this medication, talk to your nurse or doctor. Original instructions:  Take 1 tablet (5 mg total) by mouth daily. What changed:    how much to take  when to take this  additional instructions        Signed: Sol Blazing 12/08/2017, 12:00 PM

## 2017-12-08 NOTE — Progress Notes (Signed)
Pt concerned about left foot swelling and bruised. Pt wanted MD to assess and make sure not broken. Pt did not want to have it x-rayed. Pt has had swelling in both ankles and legs from fluid. Pt is frequently encouraged to keep feet/legs elevated when not ambulating. Pt resting with call bell within reach.  Will continue to monitor. >mec

## 2017-12-08 NOTE — Progress Notes (Signed)
Patient ID: MARKS SCALERA, male   DOB: October 24, 1955, 62 y.o.   MRN: [id number]  [facility] Progress Note   Assessment/ Plan:   1.  Left upper extremity wounds: Status post washout/irrigation with ligation of basilic vein fistula.  On antibiotic therapy with ceftriaxone (prior cultures showing Serratia and Enterobacter)-rising leukocytosis noted with hypotension and tachycardia noted overnight.  Awaiting input from infectious disease team. 2. ESRD: Continue hemodialysis on a Monday/Wednesday/Friday schedule-we will order for hemodialysis tomorrow.  No acute indications noted at this time. 3. Anemia: Hemoglobin improved status post transfusion- continue ESA. 4. CKD-MBD: Continue calcium acetate with meals 3 times a day for phosphorus binding, will trend calcium and phosphorus. 5. Nutrition: Likely low albumin associated with recent problems with fistula/wounds-continue nutritional supplementation. 6. Hypertension: Intermittently hypotensive overnight requiring fluid boluses-hold carvedilol as needed.  Subjective:   Expresses some frustration that he is still in the hospital "I am ready to go home"   Objective:   BP (!) 144/95 (BP Location: Right Arm)   Pulse 97   Temp 98.3 F (36.8 C) (Oral)   Resp 17   Ht 6\' 3"  (1.905 m)   Wt 90 kg   SpO2 96%   BMI 24.80 kg/m   Physical Exam: Gen: Appears comfortable when seen on HD CVS: Pulse regular rhythm, normal rate, S1 and S2 normal. RIJ TDC Resp: Clear to auscultation, no rales/rhonchi Abd: Soft, flat, nontender Ext: Trace-1+ bilateral lower extremity edema  Labs: BMET Recent Labs  Lab 12/01/17 1412 12/02/17 1443 12/04/17 2205 12/05/17 0503 12/06/17 0501 12/07/17 0534 12/08/17 0456  NA 135 130* 129* 130* 134* 129* 135  K 4.7 4.9 3.8 3.9 3.9 4.0 4.2  CL 105 101 95* 97* 94* 92* 98  CO2 23 22 25 27  34* 28 27  GLUCOSE 131* 102* 124* 128* 112* 140* 103*  BUN 44* 39* 25* 32* 16 30* 21  CREATININE 5.01* 4.49* 2.85*  3.15* 2.40* 3.46* 2.55*  CALCIUM 8.3* 8.0* 6.8* 6.8* 7.5* 7.2* 7.6*   CBC Recent Labs  Lab 12/04/17 2205  12/06/17 0501 12/06/17 1621 12/06/17 2144 12/07/17 0534 12/08/17 0456  WBC 5.4   < > 7.6 6.5 10.0 7.4 12.5*  NEUTROABS 4.7  --  6.5 5.9  --  6.2  --   HGB 6.5*   < > 8.3* 7.5* 8.3* 7.2* 10.3*  HCT 18.8*   < > 24.9* 22.5* 25.4* 21.9* 31.3*  MCV 95.4   < > 96.1 96.2 97.7 97.3 94.6  PLT 108*   < > 75* 59* 70* 68* 79*   < > = values in this interval not displayed.   Medications:    . calcitRIOL  0.25 mcg Oral Q T,Th,Sa-HD  . calcium acetate  667 mg Oral TID WC  . carvedilol  25 mg Oral BID  . Chlorhexidine Gluconate Cloth  6 each Topical Q0600  . cholecalciferol  2,000 Units Oral Daily  . epoetin (EPOGEN/PROCRIT) injection  10,000 Units Intravenous Q T,Th,Sa-HD  . ipratropium-albuterol  3 mL Nebulization TID  . multivitamin  1 tablet Oral Daily  . pantoprazole  40 mg Oral Daily  . sodium chloride flush  3 mL Intravenous Q12H   Elmarie Shiley, MD 12/08/2017, 10:53 AM

## 2017-12-08 NOTE — Progress Notes (Signed)
  Progress Note    12/08/2017 7:23 AM 2 Days Post-Op  Subjective:  Wants to go home;   Afebrile  HR 80's-150's Afib 93'T-701'X systolic 79% 3JQ3ES and 92% on RA this am  Vitals:   12/08/17 0005 12/08/17 0523  BP: 97/62 (!) 144/95  Pulse: 97   Resp: 17 17  Temp:  98.3 F (36.8 C)  SpO2: 95%     Physical Exam: Cardiac:  irregular Lungs:  Non labored Incisions:  Unchanged from yesterday Extremities:  Easily palpable left radial pulse   CBC    Component Value Date/Time   WBC 12.5 (H) 12/08/2017 0456   RBC 3.31 (L) 12/08/2017 0456   HGB 10.3 (L) 12/08/2017 0456   HCT 31.3 (L) 12/08/2017 0456   PLT 79 (L) 12/08/2017 0456   MCV 94.6 12/08/2017 0456   MCH 31.1 12/08/2017 0456   MCHC 32.9 12/08/2017 0456   RDW 17.2 (H) 12/08/2017 0456   LYMPHSABS 0.5 (L) 12/07/2017 0534   MONOABS 0.5 12/07/2017 0534   EOSABS 0.0 12/07/2017 0534   BASOSABS 0.0 12/07/2017 0534    BMET    Component Value Date/Time   NA 135 12/08/2017 0456   K 4.2 12/08/2017 0456   CL 98 12/08/2017 0456   CO2 27 12/08/2017 0456   GLUCOSE 103 (H) 12/08/2017 0456   BUN 21 12/08/2017 0456   CREATININE 2.55 (H) 12/08/2017 0456   CALCIUM 7.6 (L) 12/08/2017 0456   GFRNONAA 26 (L) 12/08/2017 0456   GFRAA 30 (L) 12/08/2017 0456    INR    Component Value Date/Time   INR 1.07 12/08/2017 0456     Intake/Output Summary (Last 24 hours) at 12/08/2017 0723 Last data filed at 12/08/2017 0700 Gross per 24 hour  Intake 1152.99 ml  Output 3270 ml  Net -2117.01 ml     Assessment:  62 y.o. male is s/p:  Exploration of left upper extremity wounds with washout and ligation of basilic vein AV fistula   2 Days Post-Op  Plan: -dressing changed this morning-not much change from yesterday-no drainage or active bleeding.  Continue wet to dry saline dressing changes daily.  -acute blood loss anemia improved with transfusion -hypotension overnight and received fluid boluses -leukocytosis worsening this am,  pt remains afebrile.  May need extended period of abx on HD due to hx of slow healing infections.  Wound cx grew out Serratia marcescens - sensitivities pending -thrombocytopenia improved but still decreased. -DVT prophylaxis:  Most likely can resume coumadin today-will d/w Dr. Zada Girt, PA-C [facility] [phone] 12/08/2017 7:23 AM

## 2017-12-08 NOTE — Progress Notes (Addendum)
[facility] for Infectious Disease  Date of Admission:  12/04/2017             ASSESSMENT/PLAN  Mr. Dejarnette has Serratia wound infection which is likely the source of his Serratia bacteremia. Overnight had an increase in WBC up to 12.5, however he remains afebrile. He is on Day 5 on antimicrobial therapy with Ceftriaxone currently. Appears stable and tolerating the Ceftriaxone without complication. Will likely require prolonged therapy as this is a complicated bacteremia and not localized to soft tissue. The  goal will be 3 weeks of IV therapy with dialysis. Will change him to Ceftazidime for ease of dosing with dialysis, as he has limited venous access and would require an additional central line if we were to continue the Ceftriaxone. Repeat cultures remain pending.   1. Change ceftriaxone to ceftazidime. Start Ceftazidime with HD tomorrow as he has received his ceftriaxone today.  2. Continue to monitor cultures. 3. Wound care per vascular surgery. 4. OPAT orders placed.  Diagnosis: Serratia Bacteremia and Wound infection   Culture Result: Serratia Marcescens  No Known Allergies  OPAT Orders Discharge antibiotics: Ceftazidime with HD Per pharmacy protocol   Duration:  3 weeks  End Date: 12/30/17  [facility] Care Per Protocol:  Labs weekly while on IV antibiotics: _X_ CBC with differential __ BMP _X_ CMP __ CRP __ ESR __ Vancomycin trough  __ Please pull PIC at completion of IV antibiotics __ Please leave PIC in place until doctor has seen patient or been notified  Fax weekly labs to [phone]  Clinic Follow Up Appt:  With Terri Piedra or Dr. Megan Salon in 3 weeks    Principal Problem:   Serratia wound infection Active Problems:   Bacteremia due to Gram-negative bacteria   Syncope   Anemia   . calcitRIOL  0.25 mcg Oral Q T,Th,Sa-HD  . calcium acetate  667 mg Oral TID WC  . carvedilol  25 mg Oral BID  . Chlorhexidine Gluconate Cloth  6 each Topical Q0600    . cholecalciferol  2,000 Units Oral Daily  . epoetin (EPOGEN/PROCRIT) injection  10,000 Units Intravenous Q T,Th,Sa-HD  . ipratropium-albuterol  3 mL Nebulization TID  . multivitamin  1 tablet Oral Daily  . pantoprazole  40 mg Oral Daily  . sodium chloride flush  3 mL Intravenous Q12H    SUBJECTIVE:  Afebrile overnight with mildly increasing leukocytosis of 12.5. Repeat blood cultures are pending.   Feels good today. No pain in his arm. Ready to go home.     No Known Allergies   Review of Systems: Review of Systems  Constitutional: Negative for chills, diaphoresis, fever, malaise/fatigue and weight loss.  Respiratory: Negative for cough, shortness of breath and wheezing.   Cardiovascular: Negative for chest pain and leg swelling.  Skin: Negative for rash.     OBJECTIVE: Vitals:   12/07/17 2356 12/08/17 0005 12/08/17 0523 12/08/17 0737  BP: 105/69 97/62 (!) 144/95   Pulse: (!) 156 97    Resp: (!) '30 17 17   ' Temp: 98 F (36.7 C)  98.3 F (36.8 C)   TempSrc: Oral  Oral   SpO2: 96% 95%  96%  Weight:      Height:       Body mass index is 24.8 kg/m.  Physical Exam  Constitutional: He is oriented to person, place, and time. He appears well-developed and well-nourished. No distress.  Seated in the chair; Pleasant  Cardiovascular: Normal rate, regular rhythm, normal heart  sounds and intact distal pulses. Exam reveals no gallop and no friction rub.  Pulmonary/Chest: Effort normal and breath sounds normal. No stridor. No respiratory distress. He has no rales. He exhibits no tenderness.  Neurological: He is alert and oriented to person, place, and time.  Skin: Skin is warm and dry.  Psychiatric: He has a normal mood and affect.    Lab Results Lab Results  Component Value Date   WBC 12.5 (H) 12/08/2017   HGB 10.3 (L) 12/08/2017   HCT 31.3 (L) 12/08/2017   MCV 94.6 12/08/2017   PLT 79 (L) 12/08/2017    Lab Results  Component Value Date   CREATININE 2.55 (H)  12/08/2017   BUN 21 12/08/2017   NA 135 12/08/2017   K 4.2 12/08/2017   CL 98 12/08/2017   CO2 27 12/08/2017    Lab Results  Component Value Date   ALT 12 12/05/2017   AST 42 (H) 12/05/2017   ALKPHOS 89 12/05/2017   BILITOT 0.5 12/05/2017     Microbiology: Recent Results (from the past 240 hour(s))  Culture, blood (Routine x 2)     Status: Abnormal   Collection Time: 12/04/17  9:41 PM  Result Value Ref Range Status   Specimen Description   Final    BLOOD RIGHT FOREARM Performed at [facility], [address] [place], [place] [address]    Special Requests   Final    BOTTLES DRAWN AEROBIC AND ANAEROBIC Blood Culture adequate volume Performed at [facility], [address] [place], [place] [address]    Culture  Setup Time   Final    GRAM NEGATIVE RODS Gram Stain Report Called to,Read Back By and Verified With: JACKSON,N. AT 1421 ON 12/05/2017 BY EVA AEROBIC BOTTLE ONLY Performed at [facility]    Culture (A)  Final    SERRATIA MARCESCENS SUSCEPTIBILITIES PERFORMED ON PREVIOUS CULTURE WITHIN THE LAST 5 DAYS. Performed at [facility], [place] [address], [place], [place] [address]    Report Status 12/07/2017 FINAL  Final  Culture, blood (Routine x 2)     Status: Abnormal   Collection Time: 12/04/17 10:06 PM  Result Value Ref Range Status   Specimen Description   Final    BLOOD RIGHT HAND Performed at [facility], [address], [place], [place] [address]    Special Requests   Final    BOTTLES DRAWN AEROBIC AND ANAEROBIC Blood Culture adequate volume Performed at [facility], [name], [place], [place] [address]    Culture  Setup Time   Final    GRAM NEGATIVE RODS Gram Stain Report Called to,Read Back By and Verified With: JACKSON,N. AT 1421 ON 12/05/2017 BY EVA AEROBIC BOTTLE ONLY Performed at [place] Gram Stain Report Called to,Read Back By and Verified With: [place] A. AT [facility] AT 8563 ON [id number] BY THOMPSON  S. ANAEROBIC BOTTLE  Performed at [facility], [address] [place],  [address]    Culture SERRATIA MARCESCENS (A)  Final   Report Status 12/08/2017 FINAL  Final   Organism ID, Bacteria SERRATIA MARCESCENS  Final      Susceptibility   Serratia marcescens - MIC*    CEFAZOLIN >=64 RESISTANT Resistant     CEFEPIME <=1 SENSITIVE Sensitive     CEFTAZIDIME <=1 SENSITIVE Sensitive     CEFTRIAXONE <=1 SENSITIVE Sensitive     CIPROFLOXACIN <=0.25 SENSITIVE Sensitive     GENTAMICIN <=1 SENSITIVE Sensitive  TRIMETH/SULFA <=20 SENSITIVE Sensitive     * SERRATIA MARCESCENS  Blood Culture ID Panel (Reflexed)     Status: Abnormal   Collection Time: 12/04/17 10:06 PM  Result Value Ref Range Status   Enterococcus species NOT DETECTED NOT DETECTED Final   Listeria monocytogenes NOT DETECTED NOT DETECTED Final   Staphylococcus species NOT DETECTED NOT DETECTED Final   Staphylococcus aureus NOT DETECTED NOT DETECTED Final   Streptococcus species NOT DETECTED NOT DETECTED Final   Streptococcus agalactiae NOT DETECTED NOT DETECTED Final   Streptococcus pneumoniae NOT DETECTED NOT DETECTED Final   Streptococcus pyogenes NOT DETECTED NOT DETECTED Final   Acinetobacter baumannii NOT DETECTED NOT DETECTED Final   Enterobacteriaceae species DETECTED (A) NOT DETECTED Final    Comment: Enterobacteriaceae represent a large family of gram-negative bacteria, not a single organism. CRITICAL RESULT CALLED TO, READ BACK BY AND VERIFIED WITH: Marissa Nestle, RN (APH) AT 1810 ON 12/05/17 BY C. JESSUP, MLT.    Enterobacter cloacae complex NOT DETECTED NOT DETECTED Final   Escherichia coli NOT DETECTED NOT DETECTED Final   Klebsiella oxytoca NOT DETECTED NOT DETECTED Final   Klebsiella pneumoniae NOT DETECTED NOT DETECTED Final   Proteus species NOT DETECTED NOT DETECTED Final   Serratia marcescens DETECTED (A) NOT DETECTED Final    Comment: CRITICAL RESULT CALLED TO, READ BACK BY AND VERIFIED WITH: Marissa Nestle, RN (APH) AT 1810 ON 12/05/17 BY C. JESSUP, MLT.    Carbapenem resistance NOT DETECTED NOT DETECTED Final   Haemophilus influenzae NOT DETECTED NOT DETECTED Final   Neisseria meningitidis NOT DETECTED NOT DETECTED Final   Pseudomonas aeruginosa NOT DETECTED NOT DETECTED Final   Candida albicans NOT DETECTED NOT DETECTED Final   Candida glabrata NOT DETECTED NOT DETECTED Final   Candida krusei NOT DETECTED NOT DETECTED Final   Candida parapsilosis NOT DETECTED NOT DETECTED Final   Candida tropicalis NOT DETECTED NOT DETECTED Final    Comment: Performed at [facility], [place] [address], [place], [place] [address]  Aerobic/Anaerobic Culture (surgical/deep wound)     Status: None (Preliminary result)   Collection Time: 12/06/17 11:21 AM  Result Value Ref Range Status   Specimen Description WOUND LEFT UPPER ARM  Final   Special Requests PATIENT ON FOLLOWING ROCEPHIN  Final   Gram Stain   Final    NO WBC SEEN NO ORGANISMS SEEN Performed at [facility], [address], [place], [place] [address]    Culture MODERATE SERRATIA MARCESCENS  Final   Report Status PENDING  Incomplete     Terri Piedra, NP [place] [facility] [place] [phone] Pager  12/08/2017  10:19 AM

## 2017-12-08 NOTE — Evaluation (Signed)
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  INR was 1.67.  He was transfused 2 units of PRBCs.  He continued to have bleeding from left upper extremity AV fistula, coagulopathy was reversed with 2 units of FFP and 5 mg of IV vitamin K.  He was transferred to [facility] for vascular surgery evaluation.  He underwent exploration of left upper extremity wounds with washout and ligation of basilic vein AV fistula.  He now has Serratia marcescens bacteremia and is on empiric IV ceftriaxone.   Clinical Impression  Pt presents to PT requiring rolling walker for gait stability. Recommend RW for home.     Follow Up Recommendations No PT follow up    Equipment Recommendations  Rolling walker with 5" wheels;3in1 (PT)    Recommendations for Other Services       Precautions / Restrictions Precautions Precautions: Fall Restrictions Weight Bearing Restrictions: No      Mobility  Bed Mobility               General bed mobility comments: pt in recliner upon arrival  Transfers Overall transfer level: Needs assistance Equipment used: Rolling walker (2 wheeled) Transfers: Sit to/from Stand Sit to Stand: Supervision         General transfer comment: Incr time  Ambulation/Gait Ambulation/Gait assistance:  Supervision Gait Distance (Feet): 125 Feet Assistive device: Rolling walker (2 wheeled) Gait Pattern/deviations: Step-through pattern;Decreased stride length;Trunk flexed Gait velocity: decr Gait velocity interpretation: 1.31 - 2.62 ft/sec, indicative of limited community ambulator General Gait Details: Verbal cues to stand more erect and stay closer to walker  Stairs            Wheelchair Mobility    Modified Rankin (Stroke Patients Only)       Balance Overall balance assessment: Mild deficits observed, not formally tested                                           Pertinent Vitals/Pain Pain Assessment: Faces Pain Score: 7  Faces Pain Scale: Hurts even more Pain Location: back, L UE, lt foot Pain Descriptors / Indicators: Sore Pain Intervention(s): Limited activity within patient's tolerance    Home Living Family/patient expects to be discharged to:: Private residence Living Arrangements: Alone Available Help at Discharge: Available PRN/intermittently;Family Type of Home: Apartment Home Access: Level entry     Home Layout: One level Home Equipment: Cane - single point;Grab bars - toilet;Grab bars - tub/shower      Prior Function Level of Independence: Independent with assistive device(s)   Gait / Transfers Assistance Needed: uses cane  ADL's / Homemaking Assistance Needed: reports that he was independent with ADLs/selfcare abd was driving up unitl a week ago  Comments: used cane and scooter at grocery store for  last 2 months     Hand Dominance   Dominant Hand: Right    Extremity/Trunk Assessment   Upper Extremity Assessment Upper Extremity Assessment: Defer to OT evaluation    Lower Extremity Assessment Lower Extremity Assessment: Generalized weakness       Communication   Communication: No difficulties  Cognition Arousal/Alertness: Awake/alert Behavior During Therapy: WFL for tasks assessed/performed Overall Cognitive  Status: Within Functional Limits for tasks assessed                                        General Comments      Exercises     Assessment/Plan    PT Assessment Patent does not need any further PT services  PT Problem List         PT Treatment Interventions      PT Goals (Current goals can be found in the Care Plan section)  Acute Rehab PT Goals Patient Stated Goal: go home PT Goal Formulation: All assessment and education complete, DC therapy    Frequency     Barriers to discharge        Co-evaluation               AM-PAC PT "6 Clicks" Daily Activity  Outcome Measure Difficulty turning over in bed (including adjusting bedclothes, sheets and blankets)?: None Difficulty moving from lying on back to sitting on the side of the bed? : None Difficulty sitting down on and standing up from a chair with arms (e.g., wheelchair, bedside commode, etc,.)?: A Little Help needed moving to and from a bed to chair (including a wheelchair)?: None Help needed walking in hospital room?: None Help needed climbing 3-5 steps with a railing? : A Little 6 Click Score: 22    End of Session   Activity Tolerance: Patient tolerated treatment well Patient left: in chair;with call bell/phone within reach Nurse Communication: Mobility status PT Visit Diagnosis: Unsteadiness on feet (R26.81)    Time: 1351-1406 PT Time Calculation (min) (ACUTE ONLY): 15 min   Charges:   PT Evaluation $PT Eval Low Complexity: [place] 12/08/2017, 2:51 PM

## 2017-12-08 NOTE — Evaluation (Signed)
Occupational Therapy Evaluation Patient Details Name: Aaron [name] MRN: [id number] DOB: 1955/06/28 Today's Date: 12/08/2017    History of Present Illness 62 year old male with PMH of atrial fibrillation on chronic Coumadin, mild nonobstructive CAD by cardiac cath 2002, chronic back pain (has had neck and lumbar spine surgery), COPD, GERD, ESRD on TTS HD, HTN, HLD, PAD, recent hematoma evacuation of the left upper extremity second stage basilic vein transposition and started on Keflex for possible infected hematoma, sustained fall day prior to admission with syncope and split open his left upper arm incision with associated bleeding and initially presented to the [facility] with hemoglobin was 6.5 (was 7.9 four days prior).  INR was 1.67.  He was transfused 2 units of PRBCs.  He continued to have bleeding from left upper extremity AV fistula, coagulopathy was reversed with 2 units of FFP and 5 mg of IV vitamin K.  He was transferred to [facility] for vascular surgery evaluation.  He underwent exploration of left upper extremity wounds with washout and ligation of basilic vein AV fistula.  He now has Serratia marcescens bacteremia and is on empiric IV ceftriaxone.    Clinical Impression   Pt with decline in function and safety with ADLs and ADL mobility with decreased balance and endurance. Pt reports that he was independent with ADLs/selfcare and used a cane PTA. Pt would benefit from acute OT to maximize level of function and safety    Follow Up Recommendations  Home health OT    Equipment Recommendations  3 in 1 bedside commode;Other (comment)(reacher)    Recommendations for Other Services       Precautions / Restrictions Precautions Precautions: Fall Restrictions Weight Bearing Restrictions: No      Mobility Bed Mobility               General bed mobility comments: pt in recliner upon arrival  Transfers Overall transfer level: Needs assistance Equipment used: Rolling  walker (2 wheeled) Transfers: Sit to/from Stand Sit to Stand: Min guard         General transfer comment: required increased time and effort, rest breaks in between tranfers    Balance Overall balance assessment: Mild deficits observed, not formally tested                                         ADL either performed or assessed with clinical judgement   ADL Overall ADL's : Needs assistance/impaired Eating/Feeding: Independent;Sitting   Grooming: Wash/dry hands;Wash/dry face;Min guard;Standing   Upper Body Bathing: Set up;Sitting   Lower Body Bathing: Minimal assistance;Sitting/lateral leans;Sit to/from stand   Upper Body Dressing : Set up;Sitting   Lower Body Dressing: Minimal assistance;Sitting/lateral leans;Sit to/from stand   Toilet Transfer: Min guard;Ambulation;RW;BSC   Toileting- Clothing Manipulation and Hygiene: Minimal assistance;Sit to/from stand       Functional mobility during ADLs: Min guard General ADL Comments: increased time and effort, fatigues easily     Vision Baseline Vision/History: Wears glasses Wears Glasses: Reading only Patient Visual Report: No change from baseline       Perception     Praxis      Pertinent Vitals/Pain Pain Assessment: 0-10 Pain Score: 7  Pain Location: back, L UE Pain Descriptors / Indicators: Sore Pain Intervention(s): Limited activity within patient's tolerance;Monitored during session;Repositioned     Hand Dominance Right   Extremity/Trunk Assessment Upper Extremity Assessment Upper Extremity Assessment:  Generalized weakness   Lower Extremity Assessment Lower Extremity Assessment: Defer to PT evaluation       Communication Communication Communication: No difficulties   Cognition Arousal/Alertness: Awake/alert Behavior During Therapy: WFL for tasks assessed/performed Overall Cognitive Status: Within Functional Limits for tasks assessed                                      General Comments       Exercises     Shoulder Instructions      Home Living Family/patient expects to be discharged to:: Private residence Living Arrangements: Alone Available Help at Discharge: Available PRN/intermittently;Family Type of Home: Apartment Home Access: Level entry     Home Layout: One level     Bathroom Shower/Tub: International aid/development worker Accessibility: Yes   Home Equipment: Cane - single point;Grab bars - toilet;Grab bars - tub/shower          Prior Functioning/Environment Level of Independence: Independent with assistive device(s)  Gait / Transfers Assistance Needed: uses cane ADL's / Homemaking Assistance Needed: reports that he was independent with ADLs/selfcare abd was driving up unitl a week ago   Comments: used cane and scooter at grocery store for last 2 months        OT Problem List: Decreased activity tolerance;Decreased knowledge of use of DME or AE;Impaired balance (sitting and/or standing);Pain      OT Treatment/Interventions:      OT Goals(Current goals can be found in the care plan section) Acute Rehab OT Goals Patient Stated Goal: go home OT Goal Formulation: With patient Time For Goal Achievement: 12/15/17 Potential to Achieve Goals: Good ADL Goals Pt Will Perform Grooming: with supervision;with set-up;standing Pt Will Perform Lower Body Bathing: with min guard assist;with supervision;sitting/lateral leans;sit to/from stand;with caregiver independent in assisting Pt Will Perform Lower Body Dressing: with min guard assist;with supervision;with caregiver independent in assisting;sit to/from stand;sitting/lateral leans Pt Will Transfer to Toilet: with supervision;ambulating;regular height toilet;bedside commode;grab bars Pt Will Perform Toileting - Clothing Manipulation and hygiene: with min guard assist;with supervision;sit to/from stand;with caregiver independent in assisting  OT Frequency: Min 2X/week   Barriers to  D/C:    no barriers, pt reports that he sister is coming to stay with him to assist prn       Co-evaluation              AM-PAC PT "6 Clicks" Daily Activity     Outcome Measure Help from another person eating meals?: None Help from another person taking care of personal grooming?: A Little Help from another person toileting, which includes using toliet, bedpan, or urinal?: A Little Help from another person bathing (including washing, rinsing, drying)?: A Little Help from another person to put on and taking off regular upper body clothing?: None Help from another person to put on and taking off regular lower body clothing?: A Little 6 Click Score: 20   End of Session Equipment Utilized During Treatment: Gait belt;Other (comment);Rolling walker(BSC)  Activity Tolerance: Patient limited by fatigue Patient left: in chair;with call bell/phone within reach  OT Visit Diagnosis: Other abnormalities of gait and mobility (R26.89);Pain;Muscle weakness (generalized) (M62.81) Pain - Right/Left: Left Pain - part of body: Arm(back)                Time: 7616-0737 OT Time Calculation (min): 32 min Charges:  OT General Charges $OT Visit: 1 Visit OT Evaluation $  OT Eval Low Complexity: 1 Low    Britt [place] 12/08/2017, 2:10 PM

## 2017-12-11 LAB — AEROBIC/ANAEROBIC CULTURE (SURGICAL/DEEP WOUND)

## 2017-12-11 LAB — AEROBIC/ANAEROBIC CULTURE W GRAM STAIN (SURGICAL/DEEP WOUND): Gram Stain: NONE SEEN

## 2017-12-13 LAB — CULTURE, BLOOD (ROUTINE X 2)
CULTURE: NO GROWTH
Culture: NO GROWTH
SPECIAL REQUESTS: ADEQUATE

## 2017-12-15 ENCOUNTER — Ambulatory Visit (INDEPENDENT_AMBULATORY_CARE_PROVIDER_SITE_OTHER): Payer: 59 | Admitting: *Deleted

## 2017-12-15 DIAGNOSIS — I480 Paroxysmal atrial fibrillation: Secondary | ICD-10-CM | POA: Diagnosis not present

## 2017-12-15 DIAGNOSIS — Z5181 Encounter for therapeutic drug level monitoring: Secondary | ICD-10-CM

## 2017-12-15 LAB — POCT INR: INR: 1.6 — AB (ref 2.0–3.0)

## 2017-12-15 NOTE — Patient Instructions (Signed)
Take coumadin 1 1/2 tablets tonight then resume 1 tablet daily except 1/2 tablet on Fridays Order given to [facility] in 1 week

## 2017-12-23 ENCOUNTER — Ambulatory Visit (INDEPENDENT_AMBULATORY_CARE_PROVIDER_SITE_OTHER): Payer: 59 | Admitting: *Deleted

## 2017-12-23 ENCOUNTER — Telehealth: Payer: Self-pay | Admitting: *Deleted

## 2017-12-23 DIAGNOSIS — I480 Paroxysmal atrial fibrillation: Secondary | ICD-10-CM | POA: Diagnosis not present

## 2017-12-23 DIAGNOSIS — Z5181 Encounter for therapeutic drug level monitoring: Secondary | ICD-10-CM | POA: Diagnosis not present

## 2017-12-23 LAB — POCT INR: INR: 1.8 — AB (ref 2.0–3.0)

## 2017-12-23 NOTE — Telephone Encounter (Signed)
INR 1.8   pt did not take 1.5 tablet on the last day she checked it, he took 2.5 mg on Friday and 5 all the other days per Jeanmarie Hubert w/ [facility]

## 2017-12-23 NOTE — Patient Instructions (Signed)
Take coumadin 1 1/2 tablets tonight then increase dose to 1 tablet daily Order given to [place] in 1 week

## 2017-12-23 NOTE — Telephone Encounter (Signed)
Done.  See coumadin note. 

## 2017-12-25 ENCOUNTER — Emergency Department (HOSPITAL_COMMUNITY)
Admission: EM | Admit: 2017-12-25 | Discharge: 2017-12-25 | Disposition: A | Discharge status: Home / Self Care | Payer: 59 | Attending: Emergency Medicine | Admitting: Emergency Medicine

## 2017-12-25 ENCOUNTER — Emergency Department (HOSPITAL_COMMUNITY): Payer: 59

## 2017-12-25 ENCOUNTER — Encounter (HOSPITAL_COMMUNITY): Payer: Self-pay | Admitting: Emergency Medicine

## 2017-12-25 ENCOUNTER — Other Ambulatory Visit: Payer: Self-pay

## 2017-12-25 DIAGNOSIS — Z87891 Personal history of nicotine dependence: Secondary | ICD-10-CM | POA: Insufficient documentation

## 2017-12-25 DIAGNOSIS — W01198A Fall on same level from slipping, tripping and stumbling with subsequent striking against other object, initial encounter: Secondary | ICD-10-CM | POA: Insufficient documentation

## 2017-12-25 DIAGNOSIS — J449 Chronic obstructive pulmonary disease, unspecified: Secondary | ICD-10-CM | POA: Diagnosis not present

## 2017-12-25 DIAGNOSIS — I120 Hypertensive chronic kidney disease with stage 5 chronic kidney disease or end stage renal disease: Secondary | ICD-10-CM | POA: Insufficient documentation

## 2017-12-25 DIAGNOSIS — Z79899 Other long term (current) drug therapy: Secondary | ICD-10-CM | POA: Diagnosis not present

## 2017-12-25 DIAGNOSIS — I480 Paroxysmal atrial fibrillation: Secondary | ICD-10-CM | POA: Insufficient documentation

## 2017-12-25 DIAGNOSIS — Y92000 Kitchen of unspecified non-institutional (private) residence as  the place of occurrence of the external cause: Secondary | ICD-10-CM | POA: Diagnosis not present

## 2017-12-25 DIAGNOSIS — Y9389 Activity, other specified: Secondary | ICD-10-CM | POA: Diagnosis not present

## 2017-12-25 DIAGNOSIS — S0990XA Unspecified injury of head, initial encounter: Secondary | ICD-10-CM | POA: Insufficient documentation

## 2017-12-25 DIAGNOSIS — Z7901 Long term (current) use of anticoagulants: Secondary | ICD-10-CM | POA: Insufficient documentation

## 2017-12-25 DIAGNOSIS — N186 End stage renal disease: Secondary | ICD-10-CM | POA: Diagnosis not present

## 2017-12-25 DIAGNOSIS — I2510 Atherosclerotic heart disease of native coronary artery without angina pectoris: Secondary | ICD-10-CM | POA: Diagnosis not present

## 2017-12-25 DIAGNOSIS — I252 Old myocardial infarction: Secondary | ICD-10-CM | POA: Insufficient documentation

## 2017-12-25 DIAGNOSIS — Y998 Other external cause status: Secondary | ICD-10-CM | POA: Diagnosis not present

## 2017-12-25 DIAGNOSIS — E785 Hyperlipidemia, unspecified: Secondary | ICD-10-CM | POA: Diagnosis not present

## 2017-12-25 DIAGNOSIS — Z992 Dependence on renal dialysis: Secondary | ICD-10-CM | POA: Insufficient documentation

## 2017-12-25 MED ORDER — ALBUTEROL SULFATE (2.5 MG/3ML) 0.083% IN NEBU
5.0000 mg | INHALATION_SOLUTION | Freq: Once | RESPIRATORY_TRACT | Status: AC
  Administered 2017-12-25: 5 mg via RESPIRATORY_TRACT
  Filled 2017-12-25: qty 6

## 2017-12-25 MED ORDER — HYDROCODONE-ACETAMINOPHEN 5-325 MG PO TABS
2.0000 | ORAL_TABLET | Freq: Once | ORAL | Status: AC
  Administered 2017-12-25: 2 via ORAL
  Filled 2017-12-25: qty 2

## 2017-12-25 NOTE — ED Triage Notes (Addendum)
Patient states he fell last night and hit the back of his head. Denies LOC. States he went to dialysis and they refused treatment until patient is seen at ER. Patient complaining of back pain, denies head pain. States he is on warfarin.

## 2017-12-25 NOTE — ED Notes (Signed)
Pt fell last night hit heaqd No LOC At dialysis who would not see until cleared by ED for HI  Pt smokes 1 PPD

## 2017-12-25 NOTE — ED Provider Notes (Signed)
[facility] EMERGENCY DEPARTMENT Provider Note   CSN: [id number] Arrival date & time: 12/25/17  1254     History   Chief Complaint Chief Complaint  Patient presents with  . Head Injury    HPI Aaron [name] is a 62 y.o. male.  HPI  62 year old male who has a known history of atrial fibrillation, chronic back pain, advanced COPD and is on dialysis for end-stage renal disease.  He reports that he has had some difficulty ambulating ever since his fall several weeks ago when he hurt his back and damaged his fistula in his left arm.  Ultimately he was transitioned to a catheter for dialysis and his right upper chest, however when he went to dialysis today they referred him to the emergency department because of a fall that he had last night.  He reports that he was ambulating into the kitchen to get something to eat when he lost his balance using his cane, stumbled backwards and fell into the fridge striking his head on the fridge.  He had to crawl back into the other room to get on the couch and when he reattempted to walk back into the kitchen he fell again in the same place but this time landed on the ground striking his head on the ground.  The patient went to dialysis this morning, endorse that he had had this injury and had a headache requesting something for pain and they sent him to the emergency department for evaluation for head injury.  He denies any nausea vomiting blurred vision slurred speech seizures or syncope.  He reports that he is supposed to be walking with a walker or a cane but has only been using the cane.  He does have a walker at home.  Past Medical History:  Diagnosis Date  . Anxiety   . Arthritis    knees , back , shoulders (10/12/2017)  . Atrial fibrillation ([place])   . CAD (coronary artery disease)    Mild nonobstructive disease at cardiac catheterization 2002  . Chronic back pain    "back of my neck; down thru my legs" (10/12/2017)  . COPD (chronic obstructive  pulmonary disease) ([place])   . Diverticulitis   . Esophageal reflux   . ESRD (end stage renal disease) on dialysis [facility])    "TTS; [place]" (11/23/2017)  . Essential hypertension   . Hepatitis C    states he no longer has this  . History of kidney stones   . History of syncope   . Hyperlipidemia   . Jerking 09/23/2014  . Myocardial infarction ([place]) 02/2017   "light one" (10/12/2017)  . PAT (paroxysmal atrial tachycardia) ([place])   . Peripheral arterial disease ([place])    Occluded left superficial femoral artery status post stent June 2016 - Dr. Trula Slade  . Pneumonia 1961  . Polycystic kidney, unspecified type   . Syncope 09/2014  . SYNCOPE 05/07/2010   Qualifier: Diagnosis of  By: Laurance Flatten RN, BSN, [facility]      Patient Active Problem List   Diagnosis Date Noted  . Bacteremia due to Gram-negative bacteria 12/07/2017  . Hematoma of arm, right, initial encounter 11/23/2017  . Protein-calorie malnutrition, severe 10/15/2017  . Hyperlipidemia 08/24/2017  . Schatzki's ring of distal esophagus 04/27/2017  . Encounter for therapeutic drug monitoring 03/03/2017  . History of hepatitis C 02/20/2017  . Ischemic cardiomyopathy 01/27/2017  . Dyslipidemia 01/27/2017  . AF (paroxysmal atrial fibrillation) ([place]) 01/27/2017  . CAD (coronary artery disease) 01/21/2017  .  Acute renal failure (ARF) ([place]) 12/16/2016  . AKI (acute kidney injury) ([place]) 12/15/2016  . CKD (chronic kidney disease), stage V ([place]) 12/15/2016  . Hepatitis C 12/15/2016  . Polycystic kidney disease 12/14/2016  . COPD (chronic obstructive pulmonary disease) ([place])   . COLD (chronic obstructive lung disease) ([place])   . Depression   . PAD (peripheral artery disease) ([place]) 09/19/2014  . Preop cardiovascular exam 10/28/2010  . Tobacco abuse 10/28/2010  . UNSPECIFIED IRON DEFICIENCY ANEMIA 10/04/2009  . 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  . Essential hypertension 10/04/2009  . Esophageal reflux 10/04/2009  . PRECORDIAL PAIN 10/04/2009     Past Surgical History:  Procedure Laterality Date  . ABDOMINAL AORTOGRAM N/A 08/11/2017   Procedure: ABDOMINAL AORTOGRAM;  Surgeon: Serafina Mitchell, MD;  Location: [place] [facility];  Service: Cardiovascular;  Laterality: N/A;  . ANTERIOR CERVICAL DECOMP/DISCECTOMY FUSION    . APPLICATION OF WOUND VAC Right 08/14/2017   Procedure: APPLICATION OF PREVENA INCISIONAL WOUND VAC RIGHT GROIN;  Surgeon: Serafina Mitchell, MD;  Location: [facility] OR;  Service: Vascular;  Laterality: Right;  . AV FISTULA PLACEMENT Left 09/04/2017   Procedure: ARTERIOVENOUS (AV) FISTULA CREATION LEFT UPPER ARM;  Surgeon: Serafina Mitchell, MD;  Location: [place];  Service: Vascular;  Laterality: Left;  . BACK SURGERY    . BASCILIC VEIN TRANSPOSITION Left 11/20/2017   Procedure: SECOND STAGE BASILIC VEIN TRANSPOSITION LEFT ARM;  Surgeon: Serafina Mitchell, MD;  Location: [place];  Service: Vascular;  Laterality: Left;  . BIOPSY  12/17/2016   Procedure: BIOPSY;  Surgeon: Daneil Dolin, MD;  [facility];  Service: Gastroenterology;;  gastric colon  . BIOPSY  04/29/2017   Procedure: BIOPSY;  Surgeon: Daneil Dolin, MD;  [facility];  Service: Endoscopy;;  duodenal biopsies  . BUNIONECTOMY Bilateral   . COLONOSCOPY  2008   Dr. Oneida Alar: rare sigmoid colon diverticulosis, internal hemorrhoids.   . COLONOSCOPY WITH PROPOFOL N/A 12/17/2016   dense left-sided diverticulosis, right colon ulcers s/p biopsy query occult NSAID use vs transient ischemia, not consistent with IBD. CMV stains negative.   Marland Kitchen ENDARTERECTOMY FEMORAL Right 08/14/2017   Procedure: RIGHT ILLIO-FEMORAL ENDARTERECTOMY;  Surgeon: Serafina Mitchell, MD;  Location: [facility] OR;  Service: Vascular;  Laterality: Right;  . ESOPHAGEAL DILATION  12/17/2016   EGD with mild Schatzki's ring s/p dilatation, small hiatal hernia, erosive gastropathy (negative H.pylori gastritis)  . ESOPHAGOGASTRODUODENOSCOPY  2008   Dr. Oneida Alar: normal esophagus without Barrett's, antritis  and duodenitis, path with H.pylori gastritis  . ESOPHAGOGASTRODUODENOSCOPY (EGD) WITH PROPOFOL N/A 12/17/2016   Procedure: ESOPHAGOGASTRODUODENOSCOPY (EGD) WITH PROPOFOL;  Surgeon: Daneil Dolin, MD;  [facility];  Service: Gastroenterology;  Laterality: N/A;  . ESOPHAGOGASTRODUODENOSCOPY (EGD) WITH PROPOFOL N/A 04/29/2017   Patchy erythema of gastric mucosa diffusely, extensive inflammatory changes in duodenum, geographic ulceration and mucosal edema present, encroaching somewhat on the lumen yet still widely patent, distal second portion of duodenum appeared abnormal, path with peptic duodenitis with ulceration  . GROIN DEBRIDEMENT Right 10/14/2017   Procedure: RIGHT GROIN AND RIGHT LOWER QUADRANT ABDOMEN DEBRIDEMENT WITH PLACEMENT OF ANTIBIOTIC BEADS;  Surgeon: Serafina Mitchell, MD;  Location: [place];  Service: Vascular;  Laterality: Right;  . HEMATOMA EVACUATION Left 12/06/2017   Procedure: EVACUATION HEMATOMA;  Surgeon: Waynetta Sandy, MD;  Location: [place];  Service: Vascular;  Laterality: Left;  . HERNIA REPAIR  6979   umbilical  . I&D EXTREMITY Right 09/21/2017   Procedure: IRRIGATION  AND DEBRIDEMENT GROIN;  Surgeon: Elam Dutch, MD;  Location: [facility] OR;  Service: Vascular;  Laterality: Right;  . I&D EXTREMITY Left 11/23/2017   Procedure: Evacuation Hematoma LEFT UPPER ARM GRAFT;  Surgeon: Elam Dutch, MD;  Location: [place];  Service: Vascular;  Laterality: Left;  . INGUINAL HERNIA REPAIR Right 02/2017   [place]  . INSERTION OF DIALYSIS CATHETER Right 09/04/2017   Procedure: INSERTION OF TUNNELED  DIALYSIS CATHETER - RIGHT INTERNAL JUGULAR PLACEMENT;  Surgeon: Serafina Mitchell, MD;  Location: [place];  Service: Vascular;  Laterality: Right;  . INSERTION OF ILIAC STENT Right 08/14/2017   Procedure: INSERTION OF RIGHT COMMON ILIAC STENT 47mm x 67mm x 130cm INSERTION OF RIGHT EXTERNAL ILIAC STENT 77mm x 52mm x 130cm INSERTION OF SUPERFICIAL FERMORAL ARTERY STENT 52mm x 80mm x  130cm;  Surgeon: Serafina Mitchell, MD;  Location: [facility] OR;  Service: Vascular;  Laterality: Right;  . IR FLUORO GUIDE CV LINE RIGHT  08/31/2017  . IR US GUIDE VASC ACCESS RIGHT  08/31/2017  . LOWER EXTREMITY ANGIOGRAPHY Right 08/11/2017   Procedure: LOWER EXTREMITY ANGIOGRAPHY;  Surgeon: Serafina Mitchell, MD;  Location: [place] [facility];  Service: Cardiovascular;  Laterality: Right;  . PATCH ANGIOPLASTY Right 08/14/2017   Procedure: PATCH ANGIOPLASTY USING HEMASHIELD PATCH 0.3IN Lillie [place];  Surgeon: Serafina Mitchell, MD;  [facility];  Service: Vascular;  Laterality: Right;  . PERIPHERAL VASCULAR CATHETERIZATION N/A 09/20/2014   Procedure: Abdominal Aortogram;  Surgeon: Serafina Mitchell, MD;  Location: [place] [facility];  Service: Cardiovascular;  Laterality: N/A;  . POSTERIOR FUSION LUMBAR SPINE          Home Medications    Prior to Admission medications   Medication Sig Start Date End Date Taking? Authorizing Provider  B Complex-C-Folic Acid (DIALYVITE 097) 0.8 MG TABS Take 1 tablet by mouth daily. 09/15/17   [provider]  BREO ELLIPTA 200-25 MCG/INH AEPB Inhale 1 puff into the lungs daily as needed (wheezing).  07/27/17   [provider]  calcitRIOL (ROCALTROL) 0.25 MCG capsule Take 1 capsule (0.25 mcg total) by mouth Every Tuesday,Thursday,and Saturday with dialysis. 09/08/17   Ghimire, Henreitta Leber, MD  calcium acetate (PHOSLO) 667 MG capsule Take 667 mg by mouth 3 (three) times daily with meals.  09/15/17   [provider]  carvedilol (COREG) 25 MG tablet Take 1 tablet (25 mg total) 2 (two) times daily by mouth. 02/25/17 12/04/17  Imogene Burn, PA-C  cholecalciferol (VITAMIN D) 1000 units tablet Take 2,000 Units by mouth daily.    [provider]  Nutritional Supplements (FEEDING SUPPLEMENT, NEPRO CARB STEADY,) LIQD Take 237 mLs by mouth 2 (two) times daily between meals. Patient not taking: Reported on 12/02/2017 10/21/17   Gabriel Earing, PA-C    pantoprazole (PROTONIX) 40 MG tablet Take 1 tablet (40 mg total) by mouth 2 (two) times daily. 04/30/17 12/04/17  Manuella Ghazi, Pratik D, DO  warfarin (COUMADIN) 5 MG tablet Take 1 tablet (5 mg total) by mouth daily. Patient taking differently: Take 2.5-5 mg by mouth See admin instructions. 2.5mg  on Friday and 5mg  on all other days 10/21/17 10/21/18  Gabriel Earing, PA-C    Family History Family History  Problem Relation Age of Onset  . Alcoholism Mother   . Heart disease Father        Massive heart attack  . Heart attack Father   . Atrial fibrillation Father   . Colon cancer Father   .  Colon cancer Maternal Grandfather 51  . Alcoholism Maternal Grandfather   . Renal cancer Cousin   . Ovarian cancer Sister     Social History Social History   Tobacco Use  . Smoking status: Former Smoker    Packs/day: 2.00    Years: 47.00    Pack years: 94.00    Types: Cigarettes    Last attempt to quit: 08/03/2017    Years since quitting: 0.3  . Smokeless tobacco: Never Used  Substance Use Topics  . Alcohol use: Not Currently    Comment: 10/12/2017 alcohol free since 2017,  heavy drinker in the past  . Drug use: Not Currently    Comment: "<2003 whatever was around; nothing since"     Allergies   Patient has no known allergies.   Review of Systems Review of Systems  All other systems reviewed and are negative.    Physical Exam Updated Vital Signs BP 139/85   Pulse 72   Temp 97.7 F (36.5 C) (Oral)   Resp (!) 22   Ht 1.93 m (6\' 4" )   Wt 100.2 kg   SpO2 96%   BMI 26.90 kg/m   Physical Exam  Constitutional: He appears well-developed and well-nourished. No distress.  HENT:  Head: Normocephalic and atraumatic.  Mouth/Throat: Oropharynx is clear and moist. No oropharyngeal exudate.  No hematoma contusions or tenderness to the scalp or the face  Eyes: Pupils are equal, round, and reactive to light. Conjunctivae and EOM are normal. Right eye exhibits no discharge. Left eye exhibits no  discharge. No scleral icterus.  Neck: Normal range of motion. Neck supple. No JVD present. No thyromegaly present.  No tenderness over the posterior cervical spine  Cardiovascular: Normal rate, regular rhythm, normal heart sounds and intact distal pulses. Exam reveals no gallop and no friction rub.  No murmur heard. Regular rate and rhythm  Pulmonary/Chest: Effort normal and breath sounds normal. No respiratory distress. He has no wheezes. He has no rales. He exhibits no tenderness.  Wheezing bilateral lungs, mild prolonged expiratory phase, oxygen of 99%, speaks in full sentences area dialysis catheter in the right upper chest wall appears clean, no surrounding redness or tenderness  Abdominal: Soft. Bowel sounds are normal. He exhibits no distension and no mass. There is no tenderness.  Abdomen is soft and nontender  Musculoskeletal: Normal range of motion. He exhibits edema ( Bilateral lower extremity pitting edema 2+ and symmetrical below the knees, Ace wrap present in the left upper extremity, some edema of the left upper extremity below the elbow ). He exhibits no tenderness.  Lymphadenopathy:    He has no cervical adenopathy.  Neurological: He is alert. Coordination normal.  Clear speech, mentate appropriately, isolated strength in all 4 extremities is normal as is coordination.  No facial droop  Skin: Skin is warm and dry. No rash noted. No erythema.  Psychiatric: He has a normal mood and affect. His behavior is normal.  Nursing note and vitals reviewed.    ED Treatments / Results  Labs (all labs ordered are listed, but only abnormal results are displayed) Labs Reviewed - No data to display  EKG None  Radiology Ct Head Wo Contrast  Result Date: 12/25/2017 CLINICAL DATA:  Fall last night injuring back of head. Patient on Coumadin EXAM: CT HEAD WITHOUT CONTRAST TECHNIQUE: Contiguous axial images were obtained from the base of the skull through the vertex without intravenous  contrast. COMPARISON:  08/24/2017 FINDINGS: Brain: Ventricles, cisterns and other CSF spaces are  within normal as there is minimal age related atrophic change. There is mild chronic ischemic microvascular disease. There is no mass, mass effect, shift of midline structures or acute hemorrhage. No evidence of acute infarction. Possible old lacune infarct over the left lentiform nucleus. Vascular: No hyperdense vessel or unexpected calcification. Skull: Normal. Negative for fracture or focal lesion. Sinuses/Orbits: Orbits are normal. There is complete opacification over the right maxillary sinus and hypoplastic frontal sinuses. Mastoid air cells demonstrate minimal opacification over the inferior left mastoid air cells. Other: None. IMPRESSION: No acute findings. Mild age related atrophic change and chronic ischemic microvascular disease. Chronic sinus inflammatory change involving the right maxillary sinus. Electronically Signed   By: Marin Olp M.D.   On: 12/25/2017 13:49    Procedures Procedures (including critical care time)  Medications Ordered in ED Medications  HYDROcodone-acetaminophen (NORCO/VICODIN) 5-325 MG per tablet 2 tablet (has no administration in time range)  albuterol (PROVENTIL) (2.5 MG/3ML) 0.083% nebulizer solution 5 mg (5 mg Nebulization Given 12/25/17 1341)     Initial Impression / Assessment and Plan / ED Course  I have reviewed the triage vital signs and the nursing notes.  Pertinent labs & imaging results that were available during my care of the patient were reviewed by me and considered in my medical decision making (see chart for details).     The patient has no signs of trauma externally, he has some chronic back pain and thus has some tenderness across his back but nothing focal, nothing bony.  He has no signs of significant head injury but being on Coumadin having 2 falls both involving his head he will need a CT scan of the brain.  He also likely needs to have  dialysis.  Thankfully he is not hypoxic, he is not severely hypertensive, last blood pressure was 171/87.  I have reinforced with the patient at length that he does need to have better stability with walking with a walker and avoiding the cane.  He agrees and his sister who lives with him agrees to help with that as well.  CT scan of the head is negative. Patient agreeable for discharge, stable at this time to go to dialysis  Final Clinical Impressions(s) / ED Diagnoses   Final diagnoses:  Minor head injury, initial encounter    ED Discharge Orders    None       Noemi Chapel, MD 12/25/17 1354

## 2017-12-25 NOTE — Discharge Instructions (Signed)
You may go back to dialysis Your CT scan was normal - no signs of bleeding / injury Walk with a walker at ALL TIMES

## 2017-12-28 ENCOUNTER — Telehealth: Payer: Self-pay | Admitting: *Deleted

## 2017-12-28 ENCOUNTER — Ambulatory Visit: Payer: 59

## 2017-12-28 ENCOUNTER — Ambulatory Visit (INDEPENDENT_AMBULATORY_CARE_PROVIDER_SITE_OTHER): Payer: 59 | Admitting: *Deleted

## 2017-12-28 DIAGNOSIS — I480 Paroxysmal atrial fibrillation: Secondary | ICD-10-CM | POA: Diagnosis not present

## 2017-12-28 DIAGNOSIS — Z5181 Encounter for therapeutic drug level monitoring: Secondary | ICD-10-CM

## 2017-12-28 LAB — POCT INR: INR: 2.7 (ref 2.0–3.0)

## 2017-12-28 NOTE — Telephone Encounter (Signed)
Done.  See coumadin note. 

## 2017-12-28 NOTE — Telephone Encounter (Signed)
INR 2.7 PT 33.0  Pt fell on Friday or Saturday morning --fell twice hit his head and went to ER  Can reach [place] w/ [facility] @ [phone]

## 2017-12-28 NOTE — Patient Instructions (Signed)
Per Elray Mcgregor LPN [facility] pt did not take booster dose of coumadin last week or increase dose.  Will continue 1 tablet daily except 1/2 tablet on Fridays Order given to Elray Mcgregor LPN [place] in 1 week

## 2017-12-30 ENCOUNTER — Encounter: Payer: Self-pay | Admitting: Surgery

## 2017-12-30 ENCOUNTER — Ambulatory Visit (INDEPENDENT_AMBULATORY_CARE_PROVIDER_SITE_OTHER): Payer: Self-pay | Admitting: Physician Assistant

## 2017-12-30 VITALS — BP 130/81 | HR 63 | Temp 97.0°F | Resp 18 | Ht 76.0 in | Wt 204.5 lb

## 2017-12-30 DIAGNOSIS — I739 Peripheral vascular disease, unspecified: Secondary | ICD-10-CM

## 2017-12-30 DIAGNOSIS — Z992 Dependence on renal dialysis: Secondary | ICD-10-CM

## 2017-12-30 DIAGNOSIS — S40021A Contusion of right upper arm, initial encounter: Secondary | ICD-10-CM

## 2017-12-30 DIAGNOSIS — Z720 Tobacco use: Secondary | ICD-10-CM

## 2017-12-30 DIAGNOSIS — N186 End stage renal disease: Secondary | ICD-10-CM

## 2017-12-30 MED ORDER — OXYCODONE-ACETAMINOPHEN 5-325 MG PO TABS: 1.0000 | ORAL_TABLET | Freq: Four times a day (QID) | ORAL | 0 refills | Status: DC | PRN

## 2017-12-30 NOTE — Progress Notes (Signed)
    Postoperative Access Visit   History of Present Illness   Aaron [name] is a 62 y.o. year old male who presents for postoperative follow-up for evacuation of hematoma in left axilla incision and ligation of transposed brachial basilic fistula by Dr. Donzetta Matters on 12/06/2017.  Patient's sister is assisting with wet-to-dry dressing changes twice daily to incisions of left arm.   He believes these incisions are healing well does not report any signs or symptoms of infection.  He is dialyzing from right IJ tunneled dialysis catheter without complication.   The patient's surgical history is also significant for left SFA atherectomy as well as right iliac and SFA stenting with common femoral artery endarterectomy with subsequent debridement due to nonhealing and fluid collections.  Right groin incision now completely healed without fluid collection.  The patient denies any rest pain or active tissue ischemia of bilateral lower extremities.  He is taking Coumadin and Plavix as prescribed.     Physical Examination   Vitals:   12/30/17 1549  BP: 130/81  Pulse: 63  Resp: 18  Temp: (!) 97 F (36.1 C)  TempSrc: Oral  SpO2: 95%  Weight: 204 lb 8 oz (92.8 kg)  Height: 6\' 4"  (1.93 m)   Body mass index is 24.89 kg/m.  left arm Incisions of left upper arm appear clean with healthy tissue exposed; no purulence, erythema, or other sign of infection; palpable left radial pulse     Medical Decision Making   Aaron [name] is a 62 y.o. year old male who presents s/p evacuation of left axilla hematoma and ligation of left brachial basilic fistula   Continue current wound care with wet-to-dry dressing changes twice daily to both incisions of left upper arm  Number 25/325 mg Percocet prescribed today for continued postoperative pain control  He will follow-up in about 3 weeks for another wound check  Future dialysis access will not be discussed until after left arm incisions have healed  completely  Continue Plavix as prescribed for PAD  Encouraged ambulation  Encouraged smoking cessation  [name] PA-C [facility] Office: [phone]

## 2018-01-01 NOTE — Progress Notes (Deleted)
Patient: Aaron [name]  DOB: May 23, 1955 MRN: [id number] PCP: Gwenlyn Saran [place]  Referring Provider: [facility]   Patient Active Problem List   Diagnosis Date Noted  . ESRD on dialysis ([place]) 12/30/2017  . Bacteremia due to Gram-negative bacteria 12/07/2017  . Hematoma of arm, right, initial encounter 11/23/2017  . Protein-calorie malnutrition, severe 10/15/2017  . Hyperlipidemia 08/24/2017  . Schatzki's ring of distal esophagus 04/27/2017  . Encounter for therapeutic drug monitoring 03/03/2017  . History of hepatitis C 02/20/2017  . Ischemic cardiomyopathy 01/27/2017  . Dyslipidemia 01/27/2017  . AF (paroxysmal atrial fibrillation) ([place]) 01/27/2017  . CAD (coronary artery disease) 01/21/2017  . Acute renal failure (ARF) ([place]) 12/16/2016  . AKI (acute kidney injury) ([place]) 12/15/2016  . CKD (chronic kidney disease), stage V ([place]) 12/15/2016  . Hepatitis C 12/15/2016  . Polycystic kidney disease 12/14/2016  . COPD (chronic obstructive pulmonary disease) ([place])   . COLD (chronic obstructive lung disease) ([place])   . Depression   . PAD (peripheral artery disease) ([place]) 09/19/2014  . Preop cardiovascular exam 10/28/2010  . Tobacco abuse 10/28/2010  . UNSPECIFIED IRON DEFICIENCY ANEMIA 10/04/2009  . 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  . Essential hypertension 10/04/2009  . Esophageal reflux 10/04/2009  . PRECORDIAL PAIN 10/04/2009     Subjective:  No chief complaint on file.   DRACO MALCZEWSKI is a 62 y.o. man with past medical history including PAD, paroxysmal tachycardia, MI, ESRD on HD via AVF, COPD, CAD. He was hospitalized in August of 2019 with serratia bacteremia. A short time prior to admission he underwent a second stage basilic vein transposition 8/09 that developed a hematoma to the left axilla requiring evacuation on 8/12. It was noted that his fistula near the anastomosis site was "significantly diseased and friable" prior to resection/ligation. He was  discharged on IV Ceftazidime with HD x 3 weeks through 9/18 for this infection.   Of note previously he had polymicrobial bacteremia with serratia and enterococcus in July 2019 2/2 soft tissue infection that was I&D'd. Treated with cipro + amoxicillin. He is chronically anticoagulated with warfarin / plavix.   Since discharge from the hospital Mr. Prindle has seen vascular surgery on 9/18 - his sister has been performing BID wet to dry dressing changes to the left arm. He is receiving HD from tunneled IJ catheter w/o complication.  ROS  Past Medical History:  Diagnosis Date  . Anxiety   . Arthritis    knees , back , shoulders (10/12/2017)  . Atrial fibrillation ([place])   . CAD (coronary artery disease)    Mild nonobstructive disease at cardiac catheterization 2002  . Chronic back pain    "back of my neck; down thru my legs" (10/12/2017)  . COPD (chronic obstructive pulmonary disease) ([place])   . Diverticulitis   . Esophageal reflux   . ESRD (end stage renal disease) on dialysis [facility])    "TTS; [place]" (11/23/2017)  . Essential hypertension   . Hepatitis C    states he no longer has this  . History of kidney stones   . History of syncope   . Hyperlipidemia   . Jerking 09/23/2014  . Myocardial infarction ([place]) 02/2017   "light one" (10/12/2017)  . PAT (paroxysmal atrial tachycardia) ([place])   . Peripheral arterial disease ([place])    Occluded left superficial femoral artery status post stent June 2016 - Dr. Trula Slade  . Pneumonia 1961  . Polycystic kidney, unspecified type   . Syncope 09/2014  . SYNCOPE  05/07/2010   Qualifier: Diagnosis of  By: Laurance Flatten RN, BSN, Jennifer      Outpatient Medications Prior to Visit  Medication Sig Dispense Refill  . B Complex-C-Folic Acid (DIALYVITE 540) 0.8 MG TABS Take 1 tablet by mouth daily.  3  . BREO ELLIPTA 200-25 MCG/INH AEPB Inhale 1 puff into the lungs daily as needed (wheezing).     . calcitRIOL (ROCALTROL) 0.25 MCG capsule Take 1 capsule (0.25 mcg total) by  mouth Every Tuesday,Thursday,and Saturday with dialysis. 30 capsule 0  . calcium acetate (PHOSLO) 667 MG capsule Take 667 mg by mouth 3 (three) times daily with meals.   3  . carvedilol (COREG) 25 MG tablet Take 1 tablet (25 mg total) 2 (two) times daily by mouth. 180 tablet 3  . cholecalciferol (VITAMIN D) 1000 units tablet Take 2,000 Units by mouth daily.    . clopidogrel (PLAVIX) 75 MG tablet     . furosemide (LASIX) 40 MG tablet Take 80 mg by mouth every morning.  5  . metoprolol succinate (TOPROL-XL) 25 MG 24 hr tablet Take by mouth.    . Nutritional Supplements (FEEDING SUPPLEMENT, NEPRO CARB STEADY,) LIQD Take 237 mLs by mouth 2 (two) times daily between meals.  0  . oxyCODONE-acetaminophen (PERCOCET/ROXICET) 5-325 MG tablet Take 1 tablet by mouth every 6 (six) hours as needed for severe pain. 20 tablet 0  . pantoprazole (PROTONIX) 40 MG tablet Take 1 tablet (40 mg total) by mouth 2 (two) times daily. 60 tablet 1  . warfarin (COUMADIN) 5 MG tablet Take 1 tablet (5 mg total) by mouth daily. (Patient taking differently: Take 2.5-5 mg by mouth See admin instructions. 2.5mg  on Friday and 5mg  on all other days)     No facility-administered medications prior to visit.      No Known Allergies  Social History   Tobacco Use  . Smoking status: Former Smoker    Packs/day: 2.00    Years: 47.00    Pack years: 94.00    Types: Cigarettes    Last attempt to quit: 08/03/2017    Years since quitting: 0.4  . Smokeless tobacco: Never Used  Substance Use Topics  . Alcohol use: Not Currently    Comment: 10/12/2017 alcohol free since 2017,  heavy drinker in the past  . Drug use: Not Currently    Comment: "<2003 whatever was around; nothing since"    Family History  Problem Relation Age of Onset  . Alcoholism Mother   . Heart disease Father        Massive heart attack  . Heart attack Father   . Atrial fibrillation Father   . Colon cancer Father   . Colon cancer Maternal Grandfather 22  .  Alcoholism Maternal Grandfather   . Renal cancer Cousin   . Ovarian cancer Sister     Objective:  There were no vitals filed for this visit. There is no height or weight on file to calculate BMI.  Physical Exam  Lab Results: Lab Results  Component Value Date   WBC 12.5 (H) 12/08/2017   HGB 10.3 (L) 12/08/2017   HCT 31.3 (L) 12/08/2017   MCV 94.6 12/08/2017   PLT 79 (L) 12/08/2017    Lab Results  Component Value Date   CREATININE 2.55 (H) 12/08/2017   BUN 21 12/08/2017   NA 135 12/08/2017   K 4.2 12/08/2017   CL 98 12/08/2017   CO2 27 12/08/2017    Lab Results  Component Value Date  ALT 12 12/05/2017   AST 42 (H) 12/05/2017   ALKPHOS 89 12/05/2017   BILITOT 0.5 12/05/2017     Assessment & Plan:   Problem List Items Addressed This Visit    None      *** will return to clinic in *** {weeks/months} for follow up  Janene Madeira, MSN, NP-C [facility] for [place] Pager: [phone] Office: [phone]  01/01/18  7:34 PM

## 2018-01-04 ENCOUNTER — Inpatient Hospital Stay: Payer: 59 | Admitting: Internal Medicine

## 2018-01-04 ENCOUNTER — Inpatient Hospital Stay: Payer: 59 | Admitting: Infectious Diseases

## 2018-01-06 ENCOUNTER — Telehealth: Payer: Self-pay | Admitting: *Deleted

## 2018-01-06 ENCOUNTER — Ambulatory Visit (INDEPENDENT_AMBULATORY_CARE_PROVIDER_SITE_OTHER): Payer: 59 | Admitting: *Deleted

## 2018-01-06 DIAGNOSIS — Z5181 Encounter for therapeutic drug level monitoring: Secondary | ICD-10-CM | POA: Diagnosis not present

## 2018-01-06 DIAGNOSIS — I480 Paroxysmal atrial fibrillation: Secondary | ICD-10-CM | POA: Diagnosis not present

## 2018-01-06 LAB — POCT INR: INR: 1.8 — AB (ref 2.0–3.0)

## 2018-01-06 NOTE — Patient Instructions (Addendum)
Take coumadin 1 1/2 tablets tonight and tomorrow night then resume 1 tablet daily except 1/2 tablet on Fridays (Sister has been giving pt 1/2 tablet 2 x wk instead of 1 x wk) Order given to [place] in 2 weeks in [place]

## 2018-01-06 NOTE — Telephone Encounter (Signed)
Aaron [name] w/ [facility]  [phone]  INR 1.8

## 2018-01-06 NOTE — Telephone Encounter (Signed)
Done.  See coumadin note. 

## 2018-01-07 ENCOUNTER — Telehealth: Payer: Self-pay | Admitting: Internal Medicine

## 2018-01-07 NOTE — Telephone Encounter (Signed)
ID on-call physician called by his neurosurgeon in [place] to say that patient has had worsening back pain, which was imaged now showing new discitis, and paraspinal abscess.  I have recommended for patient to come to the [facility] ED for admission to expedite work up.

## 2018-01-08 ENCOUNTER — Encounter (HOSPITAL_COMMUNITY): Payer: Self-pay | Admitting: Emergency Medicine

## 2018-01-08 ENCOUNTER — Inpatient Hospital Stay (HOSPITAL_COMMUNITY)
Admission: EM | Admit: 2018-01-08 | Discharge: 2018-01-08 | DRG: 551 | Discharge status: Against Medical Advice | Payer: PRIVATE HEALTH INSURANCE | Attending: Internal Medicine | Admitting: Internal Medicine

## 2018-01-08 ENCOUNTER — Other Ambulatory Visit: Payer: Self-pay

## 2018-01-08 DIAGNOSIS — N186 End stage renal disease: Secondary | ICD-10-CM

## 2018-01-08 DIAGNOSIS — Z8051 Family history of malignant neoplasm of kidney: Secondary | ICD-10-CM

## 2018-01-08 DIAGNOSIS — I120 Hypertensive chronic kidney disease with stage 5 chronic kidney disease or end stage renal disease: Secondary | ICD-10-CM | POA: Diagnosis present

## 2018-01-08 DIAGNOSIS — Z981 Arthrodesis status: Secondary | ICD-10-CM

## 2018-01-08 DIAGNOSIS — Z87442 Personal history of urinary calculi: Secondary | ICD-10-CM | POA: Diagnosis not present

## 2018-01-08 DIAGNOSIS — R45851 Suicidal ideations: Secondary | ICD-10-CM | POA: Diagnosis present

## 2018-01-08 DIAGNOSIS — I480 Paroxysmal atrial fibrillation: Secondary | ICD-10-CM | POA: Diagnosis present

## 2018-01-08 DIAGNOSIS — Z7902 Long term (current) use of antithrombotics/antiplatelets: Secondary | ICD-10-CM

## 2018-01-08 DIAGNOSIS — K219 Gastro-esophageal reflux disease without esophagitis: Secondary | ICD-10-CM | POA: Diagnosis present

## 2018-01-08 DIAGNOSIS — M4640 Discitis, unspecified, site unspecified: Principal | ICD-10-CM

## 2018-01-08 DIAGNOSIS — I739 Peripheral vascular disease, unspecified: Secondary | ICD-10-CM | POA: Diagnosis present

## 2018-01-08 DIAGNOSIS — Z8619 Personal history of other infectious and parasitic diseases: Secondary | ICD-10-CM

## 2018-01-08 DIAGNOSIS — E785 Hyperlipidemia, unspecified: Secondary | ICD-10-CM | POA: Diagnosis present

## 2018-01-08 DIAGNOSIS — Z800 Family history of malignant neoplasm of digestive organs: Secondary | ICD-10-CM

## 2018-01-08 DIAGNOSIS — I2510 Atherosclerotic heart disease of native coronary artery without angina pectoris: Secondary | ICD-10-CM | POA: Diagnosis present

## 2018-01-08 DIAGNOSIS — Z8249 Family history of ischemic heart disease and other diseases of the circulatory system: Secondary | ICD-10-CM

## 2018-01-08 DIAGNOSIS — R000 Tachycardia, unspecified: Secondary | ICD-10-CM | POA: Diagnosis present

## 2018-01-08 DIAGNOSIS — Z79899 Other long term (current) drug therapy: Secondary | ICD-10-CM

## 2018-01-08 DIAGNOSIS — I255 Ischemic cardiomyopathy: Secondary | ICD-10-CM | POA: Diagnosis present

## 2018-01-08 DIAGNOSIS — F17211 Nicotine dependence, cigarettes, in remission: Secondary | ICD-10-CM | POA: Diagnosis present

## 2018-01-08 DIAGNOSIS — M549 Dorsalgia, unspecified: Secondary | ICD-10-CM | POA: Diagnosis present

## 2018-01-08 DIAGNOSIS — G061 Intraspinal abscess and granuloma: Secondary | ICD-10-CM | POA: Diagnosis present

## 2018-01-08 DIAGNOSIS — Z992 Dependence on renal dialysis: Secondary | ICD-10-CM

## 2018-01-08 DIAGNOSIS — Z9115 Patient's noncompliance with renal dialysis: Secondary | ICD-10-CM

## 2018-01-08 DIAGNOSIS — Z5321 Procedure and treatment not carried out due to patient leaving prior to being seen by health care provider: Secondary | ICD-10-CM | POA: Diagnosis present

## 2018-01-08 DIAGNOSIS — I252 Old myocardial infarction: Secondary | ICD-10-CM | POA: Diagnosis not present

## 2018-01-08 DIAGNOSIS — J449 Chronic obstructive pulmonary disease, unspecified: Secondary | ICD-10-CM | POA: Diagnosis present

## 2018-01-08 DIAGNOSIS — Z8041 Family history of malignant neoplasm of ovary: Secondary | ICD-10-CM

## 2018-01-08 DIAGNOSIS — Z720 Tobacco use: Secondary | ICD-10-CM

## 2018-01-08 DIAGNOSIS — Z7901 Long term (current) use of anticoagulants: Secondary | ICD-10-CM

## 2018-01-08 DIAGNOSIS — Z95828 Presence of other vascular implants and grafts: Secondary | ICD-10-CM

## 2018-01-08 DIAGNOSIS — Z811 Family history of alcohol abuse and dependence: Secondary | ICD-10-CM

## 2018-01-08 DIAGNOSIS — M4620 Osteomyelitis of vertebra, site unspecified: Secondary | ICD-10-CM | POA: Diagnosis present

## 2018-01-08 DIAGNOSIS — Z8701 Personal history of pneumonia (recurrent): Secondary | ICD-10-CM

## 2018-01-08 MED ORDER — MORPHINE SULFATE (PF) 2 MG/ML IV SOLN: 2.0000 mg | INTRAVENOUS | Status: DC | PRN

## 2018-01-08 NOTE — Consult Note (Addendum)
[facility] for Infectious Disease    Date of Admission:  01/08/2018   Total days of antibiotics: 0               Reason for Consult: Discitis, paraspinal abscess    Referring Provider: Carloyn Manner   Assessment: Discitis, paraspinal abscess ESRD HTN Tobacco use Pain control  Plan: 1. Hold anbx- he does not currently have a fever and his BP is stable. His tachycardia may be from his pain.  2. Check BCx 3. IR called, consulted. NPO after midnight, eval in AM, hopefully aspirate then. 4. Consult neurosurgery 5. Tobacco cessation/nicotine replacement 6. Renal eval for HD 7. Consider vascular eval for injury to his fistula.  8. Pt needs improved pain control, he mentions that he would like to lay down and die in ED.    Comment- Complicated pt who needs multiple interventions at a late hour on a weekend. Will do our best.    Thank you so much for this interesting consult,  Active Problems:   * No active hospital problems. *     HPI: Aaron [name] is a 62 y.o. male with hx of HTN and ESRD who 1 month ago fell on his car and injured his back. He also tore his LUE AVF for HD. He has since had persistent back which has worsened over the past week to the point that he needs a walker to ambulate.  He was seen by neurosurgery in [place], called the ID on call today, and sent to [facility] ED with a note for him to be seen by ID.    Review of Systems: Review of Systems  Constitutional: Negative for chills and fever.  Respiratory: Negative for shortness of breath.   Cardiovascular: Positive for leg swelling. Negative for chest pain.  Gastrointestinal: Negative for constipation and diarrhea.  Genitourinary: Negative for dysuria.  Musculoskeletal: Positive for back pain.  Psychiatric/Behavioral: Positive for suicidal ideas.    Past Medical History:  Diagnosis Date  . Anxiety   . Arthritis    knees , back , shoulders (10/12/2017)  . Atrial fibrillation ([place])   . CAD (coronary  artery disease)    Mild nonobstructive disease at cardiac catheterization 2002  . Chronic back pain    "back of my neck; down thru my legs" (10/12/2017)  . COPD (chronic obstructive pulmonary disease) ([place])   . Diverticulitis   . Esophageal reflux   . ESRD (end stage renal disease) on dialysis [facility])    "TTS; [place]" (11/23/2017)  . Essential hypertension   . Hepatitis C    states he no longer has this  . History of kidney stones   . History of syncope   . Hyperlipidemia   . Jerking 09/23/2014  . Myocardial infarction ([place]) 02/2017   "light one" (10/12/2017)  . PAT (paroxysmal atrial tachycardia) ([place])   . Peripheral arterial disease ([place])    Occluded left superficial femoral artery status post stent June 2016 - Dr. Trula Slade  . Pneumonia 1961  . Polycystic kidney, unspecified type   . Syncope 09/2014  . SYNCOPE 05/07/2010   Qualifier: Diagnosis of  By: Laurance Flatten RN, BSN, [place]      Social History   Tobacco Use  . Smoking status: Former Smoker    Packs/day: 2.00    Years: 47.00    Pack years: 94.00    Types: Cigarettes    Last attempt to quit: 08/03/2017    Years since quitting:  0.4  . Smokeless tobacco: Never Used  Substance Use Topics  . Alcohol use: Not Currently    Comment: 10/12/2017 alcohol free since 2017,  heavy drinker in the past  . Drug use: Not Currently    Comment: "<2003 whatever was around; nothing since"    Family History  Problem Relation Age of Onset  . Alcoholism Mother   . Heart disease Father        Massive heart attack  . Heart attack Father   . Atrial fibrillation Father   . Colon cancer Father   . Colon cancer Maternal Grandfather 67  . Alcoholism Maternal Grandfather   . Renal cancer Cousin   . Ovarian cancer Sister      Medications: Scheduled:  Abtx:  Anti-infectives (From admission, onward)   None        OBJECTIVE: Blood pressure (!) 154/97, pulse (!) 118, temperature 98.4 F (36.9 C), temperature source Oral, resp. rate 18, height  6\' 4"  (1.93 m), weight 96.6 kg, SpO2 98 %.  Physical Exam  Constitutional: He is oriented to person, place, and time. He appears well-developed and well-nourished.  HENT:  Head: Normocephalic and atraumatic.  Eyes: Pupils are equal, round, and reactive to light. EOM are normal.  Neck: Normal range of motion. Neck supple.  Cardiovascular: Normal rate, regular rhythm and normal heart sounds.  Pulmonary/Chest: Effort normal and breath sounds normal. No respiratory distress.  HD catheter in R anterior chest. Non-tender.   Abdominal: Soft. Bowel sounds are normal. He exhibits no distension. There is no tenderness.  Musculoskeletal: He exhibits edema.       Arms: Lymphadenopathy:    He has no cervical adenopathy.  Neurological: He is alert and oriented to person, place, and time.    Lab Results No results found for this or any previous visit (from the past 48 hour(s)).    Component Value Date/Time   SDES BLOOD RIGHT ARM 12/08/2017 0458   SPECREQUEST  12/08/2017 0458    BOTTLES DRAWN AEROBIC ONLY Blood Culture results may not be optimal due to an inadequate volume of blood received in culture bottles   CULT  12/08/2017 0458    NO GROWTH 5 DAYS Performed at [facility], [place] [address] [place], [place] [address]    REPTSTATUS 12/13/2017 FINAL 12/08/2017 0458   No results found. No results found for this or any previous visit (from the past 240 hour(s)).  Microbiology: No results found for this or any previous visit (from the past 240 hour(s)).  Radiographs and labs were personally reviewed by me.   Bobby Rumpf, MD [facility] for Infectious [place] [facility] [phone] 01/08/2018, 5:15 PM

## 2018-01-08 NOTE — H&P (Signed)
Patient is a 62 year old gentleman with presumed paraspinal abscess who was in the ER evaluated by infectious disease with plan for inpatient admission.  Patient was awaiting full evaluation and admission by me in the ER.  He asked for pain medications which was offered empirically before full evaluation but patient refused to stay in the hospital and decided to leave [place].

## 2018-01-08 NOTE — ED Provider Notes (Signed)
Patient placed in Quick Look pathway, seen and evaluated   Chief Complaint: spine infection  HPI:  Aaron [name] is a 62 y.o. male who presents to the ED from Dr. Rex Kras office in [place] for spine infection. Patient reports he is supposed to be a direct admit. I spoke with bed control and they do not have a bed pending for the patient.   ROS: M/S: back pain  Physical Exam:  BP (!) 154/97 (BP Location: Right Arm)   Pulse (!) 118   Temp 98.4 F (36.9 C) (Oral)   Resp 18   Ht 6\' 4"  (1.93 m)   Wt 96.6 kg   SpO2 98%   BMI 25.93 kg/m    Gen: No distress, appears to be in pain  Neuro: Awake and Alert  Skin: Warm and dry    I discussed with the patient that we are trying to find out for him about admission. Patient reports that if he does not have a room he is going home and he has a gun that will stop his pain. 5:05 PM ID in to see patient.   Initiation of care has begun. The patient has been counseled on the process, plan, and necessity for staying for the completion/evaluation, and the remainder of the medical screening examination    Ashley Murrain, NP 01/08/18 [place], Ankit, MD 01/08/18 [phone]

## 2018-01-08 NOTE — ED Provider Notes (Addendum)
[facility] EMERGENCY DEPARTMENT Provider Note   CSN: [id number] Arrival date & time: 01/08/18  1547   History   Chief Complaint Chief Complaint  Patient presents with  . Back Pain  . sent by Dr. Carloyn Manner for infection to back.   HPI Aaron [name] is a 62 y.o. male a past medical history of CAD, hepatitis C, MI who presents for evaluation of back pain.  Per patient he was seen by Dr. Carloyn Manner, neurosurgeon in [place] for back pain after he fell approximately 4 weeks ago.  Patient was called today and told to come to the emergency room for an infection in his back seen on MRI.  Patient states he slipped and fell approximately 4 weeks ago and has had increasing midline back pain.  States he thought he just pulled a muscle however has had increased pain.  Pain is rated a 10/10.  Describes his pain as a severe throbbing.  Denies radiation of his pain.  Denies bowel or bladder numbness, saddle paresthesias.  Admits to history of IV drug use however states he has not used in over 10 years.  Fever, chills, headache, neck pain, chest pain, shortness of breath, abdominal pain, nausea, diarrhea.  Admits to intermittent numbness and tingling in his legs well as bilateral swelling.  Patient is a hemodialysis patient, Monday/Wednesday/Friday, last dialysis Wednesday, 01/06/2018.  Per patient he does make some urine. Discussed with patient comments he made to ID about his pain control and SI. States he is not suicidal. Does not have a plan. Patient says he is "just in a lot of pain for a long time." Denies HI, AV hallucinations.  HPI  Past Medical History:  Diagnosis Date  . Anxiety   . Arthritis    knees , back , shoulders (10/12/2017)  . Atrial fibrillation ([place])   . CAD (coronary artery disease)    Mild nonobstructive disease at cardiac catheterization 2002  . Chronic back pain    "back of my neck; down thru my legs" (10/12/2017)  . COPD (chronic obstructive pulmonary disease) ([place])   .  Diverticulitis   . Esophageal reflux   . ESRD (end stage renal disease) on dialysis [facility])    "TTS; [place]" (11/23/2017)  . Essential hypertension   . Hepatitis C    states he no longer has this  . History of kidney stones   . History of syncope   . Hyperlipidemia   . Jerking 09/23/2014  . Myocardial infarction ([place]) 02/2017   "light one" (10/12/2017)  . PAT (paroxysmal atrial tachycardia) ([place])   . Peripheral arterial disease ([place])    Occluded left superficial femoral artery status post stent June 2016 - Dr. Trula Slade  . Pneumonia 1961  . Polycystic kidney, unspecified type   . Syncope 09/2014  . SYNCOPE 05/07/2010   Qualifier: Diagnosis of  By: Laurance Flatten RN, BSN, [facility]      Patient Active Problem List   Diagnosis Date Noted  . ESRD on dialysis ([place]) 12/30/2017  . Bacteremia due to Gram-negative bacteria 12/07/2017  . Hematoma of arm, right, initial encounter 11/23/2017  . Protein-calorie malnutrition, severe 10/15/2017  . Hyperlipidemia 08/24/2017  . Schatzki's ring of distal esophagus 04/27/2017  . Encounter for therapeutic drug monitoring 03/03/2017  . History of hepatitis C 02/20/2017  . Ischemic cardiomyopathy 01/27/2017  . Dyslipidemia 01/27/2017  . AF (paroxysmal atrial fibrillation) ([place]) 01/27/2017  . CAD (coronary artery disease) 01/21/2017  . Acute renal failure (ARF) ([place]) 12/16/2016  . AKI (  acute kidney injury) ([place]) 12/15/2016  . CKD (chronic kidney disease), stage V ([place]) 12/15/2016  . Hepatitis C 12/15/2016  . Polycystic kidney disease 12/14/2016  . COPD (chronic obstructive pulmonary disease) ([place])   . COLD (chronic obstructive lung disease) ([place])   . Depression   . PAD (peripheral artery disease) ([place]) 09/19/2014  . Preop cardiovascular exam 10/28/2010  . Tobacco abuse 10/28/2010  . UNSPECIFIED IRON DEFICIENCY ANEMIA 10/04/2009  . 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  . Essential hypertension 10/04/2009  . Esophageal reflux 10/04/2009  . PRECORDIAL PAIN 10/04/2009     Past Surgical History:  Procedure Laterality Date  . ABDOMINAL AORTOGRAM N/A 08/11/2017   Procedure: ABDOMINAL AORTOGRAM;  Surgeon: Serafina Mitchell, MD;  Location: [facility] CV LAB;  Service: Cardiovascular;  Laterality: N/A;  . ANTERIOR CERVICAL DECOMP/DISCECTOMY FUSION    . APPLICATION OF WOUND VAC Right 08/14/2017   Procedure: APPLICATION OF PREVENA INCISIONAL WOUND VAC RIGHT GROIN;  Surgeon: Serafina Mitchell, MD;  [facility];  Service: Vascular;  Laterality: Right;  . AV FISTULA PLACEMENT Left 09/04/2017   Procedure: ARTERIOVENOUS (AV) FISTULA CREATION LEFT UPPER ARM;  Surgeon: Serafina Mitchell, MD;  Location: [facility];  Service: Vascular;  Laterality: Left;  . BACK SURGERY    . BASCILIC VEIN TRANSPOSITION Left 11/20/2017   Procedure: SECOND STAGE BASILIC VEIN TRANSPOSITION LEFT ARM;  Surgeon: Serafina Mitchell, MD;  Location: [place] [facility];  Service: Vascular;  Laterality: Left;  . BIOPSY  12/17/2016   Procedure: BIOPSY;  Surgeon: Daneil Dolin, MD;  [facility];  Service: Gastroenterology;;  gastric colon  . BIOPSY  04/29/2017   Procedure: BIOPSY;  Surgeon: Daneil Dolin, MD;  [facility];  Service: Endoscopy;;  duodenal biopsies  . BUNIONECTOMY Bilateral   . COLONOSCOPY  2008   Dr. Oneida Alar: rare sigmoid colon diverticulosis, internal hemorrhoids.   . COLONOSCOPY WITH PROPOFOL N/A 12/17/2016   dense left-sided diverticulosis, right colon ulcers s/p biopsy query occult NSAID use vs transient ischemia, not consistent with IBD. CMV stains negative.   Marland Kitchen ENDARTERECTOMY FEMORAL Right 08/14/2017   Procedure: RIGHT ILLIO-FEMORAL ENDARTERECTOMY;  Surgeon: Serafina Mitchell, MD;  Location: [facility] OR;  Service: Vascular;  Laterality: Right;  . ESOPHAGEAL DILATION  12/17/2016   EGD with mild Schatzki's ring s/p dilatation, small hiatal hernia, erosive gastropathy (negative H.pylori gastritis)  . ESOPHAGOGASTRODUODENOSCOPY  2008   Dr. Oneida Alar: normal esophagus without Barrett's, antritis  and duodenitis, path with H.pylori gastritis  . ESOPHAGOGASTRODUODENOSCOPY (EGD) WITH PROPOFOL N/A 12/17/2016   Procedure: ESOPHAGOGASTRODUODENOSCOPY (EGD) WITH PROPOFOL;  Surgeon: Daneil Dolin, MD;  [facility];  Service: Gastroenterology;  Laterality: N/A;  . ESOPHAGOGASTRODUODENOSCOPY (EGD) WITH PROPOFOL N/A 04/29/2017   Patchy erythema of gastric mucosa diffusely, extensive inflammatory changes in duodenum, geographic ulceration and mucosal edema present, encroaching somewhat on the lumen yet still widely patent, distal second portion of duodenum appeared abnormal, path with peptic duodenitis with ulceration  . GROIN DEBRIDEMENT Right 10/14/2017   Procedure: RIGHT GROIN AND RIGHT LOWER QUADRANT ABDOMEN DEBRIDEMENT WITH PLACEMENT OF ANTIBIOTIC BEADS;  Surgeon: Serafina Mitchell, MD;  Location: [facility];  Service: Vascular;  Laterality: Right;  . HEMATOMA EVACUATION Left 12/06/2017   Procedure: EVACUATION HEMATOMA;  Surgeon: Waynetta Sandy, MD;  Location: [facility];  Service: Vascular;  Laterality: Left;  . HERNIA REPAIR  3086   umbilical  . I&D EXTREMITY Right 09/21/2017   Procedure: IRRIGATION AND DEBRIDEMENT GROIN;  Surgeon: Elam Dutch, MD;  [facility];  Service: Vascular;  Laterality: Right;  . I&D EXTREMITY Left 11/23/2017   Procedure: Evacuation Hematoma LEFT UPPER ARM GRAFT;  Surgeon: Elam Dutch, MD;  Location: [facility];  Service: Vascular;  Laterality: Left;  . INGUINAL HERNIA REPAIR Right 02/2017   [place]  . INSERTION OF DIALYSIS CATHETER Right 09/04/2017   Procedure: INSERTION OF TUNNELED  DIALYSIS CATHETER - RIGHT INTERNAL JUGULAR PLACEMENT;  Surgeon: Serafina Mitchell, MD;  Location: [facility];  Service: Vascular;  Laterality: Right;  . INSERTION OF ILIAC STENT Right 08/14/2017   Procedure: INSERTION OF RIGHT COMMON ILIAC STENT 61mm x 98mm x 130cm INSERTION OF RIGHT EXTERNAL ILIAC STENT 57mm x 54mm x 130cm INSERTION OF SUPERFICIAL FERMORAL ARTERY STENT 6mm x 62mm x  130cm;  Surgeon: Serafina Mitchell, MD;  Location: [facility] OR;  Service: Vascular;  Laterality: Right;  . IR FLUORO GUIDE CV LINE RIGHT  08/31/2017  . IR US GUIDE VASC ACCESS RIGHT  08/31/2017  . LOWER EXTREMITY ANGIOGRAPHY Right 08/11/2017   Procedure: LOWER EXTREMITY ANGIOGRAPHY;  Surgeon: Serafina Mitchell, MD;  Location: [facility] CV LAB;  Service: Cardiovascular;  Laterality: Right;  . PATCH ANGIOPLASTY Right 08/14/2017   Procedure: PATCH ANGIOPLASTY USING HEMASHIELD PATCH 0.3IN L[place]ie [place];  Surgeon: Serafina Mitchell, MD;  [facility];  Service: Vascular;  Laterality: Right;  . PERIPHERAL VASCULAR CATHETERIZATION N/A 09/20/2014   Procedure: Abdominal Aortogram;  Surgeon: Serafina Mitchell, MD;  Location: [facility] CV LAB;  Service: Cardiovascular;  Laterality: N/A;  . POSTERIOR FUSION LUMBAR SPINE          Home Medications    Prior to Admission medications   Medication Sig Start Date End Date Taking? Authorizing Provider  B Complex-C-Folic Acid (DIALYVITE 858) 0.8 MG TABS Take 1 tablet by mouth daily. 09/15/17   [provider]  BREO ELLIPTA 200-25 MCG/INH AEPB Inhale 1 puff into the lungs daily as needed (wheezing).  07/27/17   [provider]  calcitRIOL (ROCALTROL) 0.25 MCG capsule Take 1 capsule (0.25 mcg total) by mouth Every Tuesday,Thursday,and Saturday with dialysis. 09/08/17   Ghimire, Henreitta Leber, MD  calcium acetate (PHOSLO) 667 MG capsule Take 667 mg by mouth 3 (three) times daily with meals.  09/15/17   [provider]  carvedilol (COREG) 25 MG tablet Take 1 tablet (25 mg total) 2 (two) times daily by mouth. 02/25/17 12/04/17  Imogene Burn, PA-C  cholecalciferol (VITAMIN D) 1000 units tablet Take 2,000 Units by mouth daily.    [provider]  clopidogrel (PLAVIX) 75 MG tablet  12/24/17   [provider]  furosemide (LASIX) 40 MG tablet Take 80 mg by mouth every morning. 12/23/17   [provider]  metoprolol succinate (TOPROL-XL) 25 MG  24 hr tablet Take by mouth. 09/24/14   [provider]  Nutritional Supplements (FEEDING SUPPLEMENT, NEPRO CARB STEADY,) LIQD Take 237 mLs by mouth 2 (two) times daily between meals. 10/21/17   Rhyne, Hulen Shouts, PA-C  oxyCODONE-acetaminophen (PERCOCET/ROXICET) 5-325 MG tablet Take 1 tablet by mouth every 6 (six) hours as needed for severe pain. 12/30/17   [name], PA-C  pantoprazole (PROTONIX) 40 MG tablet Take 1 tablet (40 mg total) by mouth 2 (two) times daily. 04/30/17 12/04/17  Manuella Ghazi, Pratik D, DO  warfarin (COUMADIN) 5 MG tablet Take 1 tablet (5 mg total) by mouth daily. Patient taking differently: Take 2.5-5 mg by mouth See admin instructions. 2.5mg  on Friday and 5mg  on all other  days 10/21/17 10/21/18  Gabriel Earing, PA-C    Family History Family History  Problem Relation Age of Onset  . Alcoholism Mother   . Heart disease Father        Massive heart attack  . Heart attack Father   . Atrial fibrillation Father   . Colon cancer Father   . Colon cancer Maternal Grandfather 52  . Alcoholism Maternal Grandfather   . Renal cancer Cousin   . Ovarian cancer Sister     Social History Social History   Tobacco Use  . Smoking status: Former Smoker    Packs/day: 2.00    Years: 47.00    Pack years: 94.00    Types: Cigarettes    Last attempt to quit: 08/03/2017    Years since quitting: 0.4  . Smokeless tobacco: Never Used  Substance Use Topics  . Alcohol use: Not Currently    Comment: 10/12/2017 alcohol free since 2017,  heavy drinker in the past  . Drug use: Not Currently    Comment: "<2003 whatever was around; nothing since"     Allergies   Patient has no known allergies.   Review of Systems Review of Systems  Constitutional: Negative for activity change, appetite change, chills, diaphoresis, fatigue and fever.  Respiratory: Negative.   Cardiovascular: Negative.   Gastrointestinal: Negative.   Genitourinary: Negative.   Musculoskeletal: Positive for back  pain and gait problem. Negative for joint swelling, myalgias, neck pain and neck stiffness.  Skin: Negative.   Psychiatric/Behavioral: Negative for agitation, behavioral problems, confusion, decreased concentration, dysphoric mood, hallucinations, self-injury, sleep disturbance and suicidal ideas. The patient is not nervous/anxious and is not hyperactive.      Physical Exam Updated Vital Signs BP (!) 154/97 (BP Location: Right Arm)   Pulse (!) 118   Temp 98.4 F (36.9 C) (Oral)   Resp 18   Ht 6\' 4"  (1.93 m)   Wt 96.6 kg   SpO2 98%   BMI 25.93 kg/m   Physical Exam  Constitutional: He appears well-developed and well-nourished. No distress.  HENT:  Head: Normocephalic and atraumatic.  Mouth/Throat: Oropharynx is clear and moist.  Eyes: Pupils are equal, round, and reactive to light.  Neck: Normal range of motion. Neck supple.  Cardiovascular: Normal rate, regular rhythm and intact distal pulses. Exam reveals no gallop and no friction rub.  No murmur heard. Pulmonary/Chest: Effort normal and breath sounds normal. No stridor. No respiratory distress. He has no wheezes. He has no rales.  Central line in right upper chest.  Abdominal: Soft. He exhibits no distension.  Musculoskeletal: Normal range of motion.  Surgical incision to right upper remedy.  Proximal portion of surgical site has serosanguineous drainage to it.  No Erythema, edema, warmth to surgical site.  Midline tenderness to palpation of thoracic and lumbar spine.  Neurological: He is alert.  Speech is clear and goal oriented, follows commands Normal 5/5 strength in upper and lower extremities bilaterally including dorsiflexion and plantar flexion, strong and equal grip strength Sensation normal to light and sharp touch Moves extremities without ataxia, coordination intact Normal gait Normal balance No Clonus Full range of motion of the T-spine and L-spine with flexion, hyperextension, and lateral flexion however with  pain.   Skin: Skin is warm and dry. He is not diaphoretic.  Psychiatric: He has a normal mood and affect. His behavior is normal. Thought content normal. His mood appears not anxious. His affect is not angry, not blunt, not labile and not inappropriate. His  speech is not rapid and/or pressured, not delayed, not tangential and not slurred. He is not agitated, not aggressive, not hyperactive, not withdrawn, not actively hallucinating and not combative. Thought content is not paranoid and not delusional. He does not exhibit a depressed mood. He expresses no homicidal and no suicidal ideation. He expresses no suicidal plans and no homicidal plans. He is communicative.  Nursing note and vitals reviewed.    ED Treatments / Results  Labs (all labs ordered are listed, but only abnormal results are displayed) Labs Reviewed  CULTURE, BLOOD (ROUTINE X 2)    EKG None  Radiology No results found.  Procedures Procedures (including critical care time)  Medications Ordered in ED Medications - No data to display   Initial Impression / Assessment and Plan / ED Course  I have reviewed the triage vital signs and the nursing notes as well as past medical history.  Pertinent labs & imaging results that were available during my care of the patient were reviewed by me and considered in my medical decision making (see chart for details).  62 year old male presents for evaluation of midline back pain.  Seen by neurosurgeon, Dr. Carloyn Manner, in [place].  Had MRI done which showed possible discitis and paraspinal abscess.  Dr. Carloyn Manner has already contacted infectious disease.  Afebrile appears in pain with movement.  Tachycardic on initial exam, however may be secondary to his pain.  M/W/F dialysis. Missed today. Non focal neuro exam, without neuro deficts. Tenderness to lumbar paraspinal muscles.   Discussed patient comment to ID about possible SI. States he is not "actually going to hurt myself, Im just in pain for a long  time." Denies plan and states "I don't even own a gun". "I don't know why I said that." "I just say things when Im angry and in pain." Denies depressive mood, history of self injury. States he did "overdose on drugs a long time ago, but I never meant to hurt myself." Mood and affect is stable. No active SI, HI or AV hallucinations. No plan.  Dr. Johnnye Sima with ID consulted in triage and has seen and evaluated the patient.  Will consult with Hospitalist for admission.  1845: Hospitalist Dr. Jonelle Sidle consulted and agrees for admission.  1910: Nursing notified this provider patient requesting pain medication. On re-evalaution patient requesting "as much Fentanyl as possible." Discussed IV Morphine or Dilaudid. Patient states "that shit doesn't work, I don't want that." Discussed with nursing to consult admitting team to see what they prefer for patient's pain management as he has already been admitted to the hospitalist team.    Final Clinical Impressions(s) / ED Diagnoses   Final diagnoses:  Paraspinal abscess [facility])    ED Discharge Orders    None       Sabine Tenenbaum A, PA-C 01/08/18 2316    Maral Lampe A, PA-C 01/09/18 [id number]    Gareth Morgan, MD 01/09/18 1354

## 2018-01-08 NOTE — ED Triage Notes (Signed)
Pt reports he was sent here for admission due to infection to lower back found on MRI. Pt reports back pain and inability to walk.

## 2018-01-08 NOTE — ED Notes (Addendum)
Jonelle Sidle, MD called this RN. [place] notified pt requesting to leave. Pt notified pain meds are now available. Pt refused. Garba notified pt to leave AMA. Pt refused D/C vital signs.

## 2018-01-08 NOTE — ED Notes (Signed)
Pt requesting pain medicine. PA notified. Pt to be seen by hospitalist.

## 2018-01-08 NOTE — ED Notes (Signed)
Pt called RN into room stating "come get this shit off of me." Pt stating his ride is here and he is ready to go. RN messaged admitting MD notifying that pt is requesting pain medicine and would like to leave. Will continue to monitor.

## 2018-01-09 ENCOUNTER — Encounter (HOSPITAL_COMMUNITY): Payer: Self-pay | Admitting: Emergency Medicine

## 2018-01-09 ENCOUNTER — Inpatient Hospital Stay (HOSPITAL_COMMUNITY)
Admission: EM | Admit: 2018-01-09 | Discharge: 2018-01-13 | DRG: 094 | Discharge status: Against Medical Advice | Payer: 59 | Attending: Family Medicine | Admitting: Family Medicine

## 2018-01-09 ENCOUNTER — Other Ambulatory Visit: Payer: Self-pay

## 2018-01-09 ENCOUNTER — Emergency Department (HOSPITAL_COMMUNITY): Payer: 59

## 2018-01-09 DIAGNOSIS — Z5329 Procedure and treatment not carried out because of patient's decision for other reasons: Secondary | ICD-10-CM | POA: Diagnosis not present

## 2018-01-09 DIAGNOSIS — Z992 Dependence on renal dialysis: Secondary | ICD-10-CM

## 2018-01-09 DIAGNOSIS — N2581 Secondary hyperparathyroidism of renal origin: Secondary | ICD-10-CM | POA: Diagnosis present

## 2018-01-09 DIAGNOSIS — I120 Hypertensive chronic kidney disease with stage 5 chronic kidney disease or end stage renal disease: Secondary | ICD-10-CM | POA: Diagnosis not present

## 2018-01-09 DIAGNOSIS — N186 End stage renal disease: Secondary | ICD-10-CM | POA: Diagnosis present

## 2018-01-09 DIAGNOSIS — F419 Anxiety disorder, unspecified: Secondary | ICD-10-CM | POA: Diagnosis present

## 2018-01-09 DIAGNOSIS — K219 Gastro-esophageal reflux disease without esophagitis: Secondary | ICD-10-CM | POA: Diagnosis present

## 2018-01-09 DIAGNOSIS — E8809 Other disorders of plasma-protein metabolism, not elsewhere classified: Secondary | ICD-10-CM | POA: Diagnosis present

## 2018-01-09 DIAGNOSIS — S41102A Unspecified open wound of left upper arm, initial encounter: Secondary | ICD-10-CM | POA: Diagnosis present

## 2018-01-09 DIAGNOSIS — R7881 Bacteremia: Secondary | ICD-10-CM | POA: Diagnosis not present

## 2018-01-09 DIAGNOSIS — M4640 Discitis, unspecified, site unspecified: Secondary | ICD-10-CM

## 2018-01-09 DIAGNOSIS — E8889 Other specified metabolic disorders: Secondary | ICD-10-CM | POA: Diagnosis present

## 2018-01-09 DIAGNOSIS — D631 Anemia in chronic kidney disease: Secondary | ICD-10-CM

## 2018-01-09 DIAGNOSIS — I2510 Atherosclerotic heart disease of native coronary artery without angina pectoris: Secondary | ICD-10-CM | POA: Diagnosis present

## 2018-01-09 DIAGNOSIS — I10 Essential (primary) hypertension: Secondary | ICD-10-CM | POA: Diagnosis present

## 2018-01-09 DIAGNOSIS — N189 Chronic kidney disease, unspecified: Secondary | ICD-10-CM

## 2018-01-09 DIAGNOSIS — T82898D Other specified complication of vascular prosthetic devices, implants and grafts, subsequent encounter: Secondary | ICD-10-CM | POA: Diagnosis not present

## 2018-01-09 DIAGNOSIS — Z79899 Other long term (current) drug therapy: Secondary | ICD-10-CM

## 2018-01-09 DIAGNOSIS — J441 Chronic obstructive pulmonary disease with (acute) exacerbation: Secondary | ICD-10-CM | POA: Diagnosis present

## 2018-01-09 DIAGNOSIS — I480 Paroxysmal atrial fibrillation: Secondary | ICD-10-CM | POA: Diagnosis present

## 2018-01-09 DIAGNOSIS — E785 Hyperlipidemia, unspecified: Secondary | ICD-10-CM | POA: Diagnosis present

## 2018-01-09 DIAGNOSIS — Q613 Polycystic kidney, unspecified: Secondary | ICD-10-CM | POA: Diagnosis not present

## 2018-01-09 DIAGNOSIS — Z95820 Peripheral vascular angioplasty status with implants and grafts: Secondary | ICD-10-CM | POA: Diagnosis not present

## 2018-01-09 DIAGNOSIS — G8929 Other chronic pain: Secondary | ICD-10-CM | POA: Diagnosis present

## 2018-01-09 DIAGNOSIS — I340 Nonrheumatic mitral (valve) insufficiency: Secondary | ICD-10-CM | POA: Diagnosis not present

## 2018-01-09 DIAGNOSIS — I252 Old myocardial infarction: Secondary | ICD-10-CM | POA: Diagnosis not present

## 2018-01-09 DIAGNOSIS — Z8619 Personal history of other infectious and parasitic diseases: Secondary | ICD-10-CM | POA: Diagnosis not present

## 2018-01-09 DIAGNOSIS — Z7901 Long term (current) use of anticoagulants: Secondary | ICD-10-CM | POA: Diagnosis not present

## 2018-01-09 DIAGNOSIS — Z7902 Long term (current) use of antithrombotics/antiplatelets: Secondary | ICD-10-CM

## 2018-01-09 DIAGNOSIS — Z720 Tobacco use: Secondary | ICD-10-CM | POA: Diagnosis present

## 2018-01-09 DIAGNOSIS — F17210 Nicotine dependence, cigarettes, uncomplicated: Secondary | ICD-10-CM | POA: Diagnosis present

## 2018-01-09 DIAGNOSIS — M4646 Discitis, unspecified, lumbar region: Secondary | ICD-10-CM | POA: Diagnosis present

## 2018-01-09 DIAGNOSIS — G061 Intraspinal abscess and granuloma: Secondary | ICD-10-CM | POA: Diagnosis present

## 2018-01-09 DIAGNOSIS — M4620 Osteomyelitis of vertebra, site unspecified: Secondary | ICD-10-CM | POA: Diagnosis present

## 2018-01-09 DIAGNOSIS — J449 Chronic obstructive pulmonary disease, unspecified: Secondary | ICD-10-CM | POA: Diagnosis present

## 2018-01-09 DIAGNOSIS — B9689 Other specified bacterial agents as the cause of diseases classified elsewhere: Secondary | ICD-10-CM | POA: Diagnosis not present

## 2018-01-09 DIAGNOSIS — Z9181 History of falling: Secondary | ICD-10-CM

## 2018-01-09 LAB — CBC WITH DIFFERENTIAL/PLATELET
BASOS ABS: 0.1 10*3/uL (ref 0.0–0.1)
BASOS PCT: 1 %
EOS ABS: 0.2 10*3/uL (ref 0.0–0.7)
EOS PCT: 2 %
HCT: 27.8 % — ABNORMAL LOW (ref 39.0–52.0)
Hemoglobin: 8.8 g/dL — ABNORMAL LOW (ref 13.0–17.0)
Lymphocytes Relative: 9 %
Lymphs Abs: 0.9 10*3/uL (ref 0.7–4.0)
MCH: 30.4 pg (ref 26.0–34.0)
MCHC: 31.7 g/dL (ref 30.0–36.0)
MCV: 96.2 fL (ref 78.0–100.0)
MONO ABS: 0.8 10*3/uL (ref 0.1–1.0)
MONOS PCT: 7 %
NEUTROS ABS: 8.4 10*3/uL — AB (ref 1.7–7.7)
Neutrophils Relative %: 81 %
PLATELETS: 391 10*3/uL (ref 150–400)
RBC: 2.89 MIL/uL — ABNORMAL LOW (ref 4.22–5.81)
RDW: 19.6 % — AB (ref 11.5–15.5)
WBC: 10.4 10*3/uL (ref 4.0–10.5)

## 2018-01-09 LAB — COMPREHENSIVE METABOLIC PANEL
ALT: 8 U/L (ref 0–44)
ANION GAP: 10 (ref 5–15)
AST: 12 U/L — ABNORMAL LOW (ref 15–41)
Albumin: 2.5 g/dL — ABNORMAL LOW (ref 3.5–5.0)
Alkaline Phosphatase: 149 U/L — ABNORMAL HIGH (ref 38–126)
BILIRUBIN TOTAL: 0.4 mg/dL (ref 0.3–1.2)
BUN: 32 mg/dL — AB (ref 8–23)
CHLORIDE: 97 mmol/L — AB (ref 98–111)
CO2: 26 mmol/L (ref 22–32)
Calcium: 8 mg/dL — ABNORMAL LOW (ref 8.9–10.3)
Creatinine, Ser: 4.63 mg/dL — ABNORMAL HIGH (ref 0.61–1.24)
GFR, EST AFRICAN AMERICAN: 14 mL/min — AB (ref >60)
GFR, EST NON AFRICAN AMERICAN: 12 mL/min — AB (ref >60)
Glucose, Bld: 111 mg/dL — ABNORMAL HIGH (ref 70–99)
POTASSIUM: 4.3 mmol/L (ref 3.5–5.1)
Sodium: 133 mmol/L — ABNORMAL LOW (ref 135–145)
TOTAL PROTEIN: 7 g/dL (ref 6.5–8.1)

## 2018-01-09 LAB — PROTIME-INR
INR: 1.84
PROTHROMBIN TIME: 21.1 s — AB (ref 11.4–15.2)

## 2018-01-09 LAB — MRSA PCR SCREENING: MRSA by PCR: NEGATIVE

## 2018-01-09 MED ORDER — ACETAMINOPHEN 650 MG RE SUPP: 650.0000 mg | Freq: Four times a day (QID) | RECTAL | Status: DC | PRN

## 2018-01-09 MED ORDER — BACITRACIN-NEOMYCIN-POLYMYXIN OINTMENT TUBE
TOPICAL_OINTMENT | CUTANEOUS | Status: DC | PRN
  Administered 2018-01-10: 02:00:00 via TOPICAL
  Filled 2018-01-09: qty 14

## 2018-01-09 MED ORDER — HYDROMORPHONE HCL 1 MG/ML IJ SOLN
1.0000 mg | INTRAMUSCULAR | Status: DC | PRN
  Administered 2018-01-09 – 2018-01-13 (×16): 1 mg via INTRAVENOUS
  Filled 2018-01-09 (×16): qty 1

## 2018-01-09 MED ORDER — HYDROMORPHONE HCL 1 MG/ML IJ SOLN
1.0000 mg | Freq: Once | INTRAMUSCULAR | Status: AC
  Administered 2018-01-09: 1 mg via INTRAVENOUS
  Filled 2018-01-09: qty 1

## 2018-01-09 MED ORDER — PANTOPRAZOLE SODIUM 40 MG PO TBEC
40.0000 mg | DELAYED_RELEASE_TABLET | Freq: Every day | ORAL | Status: DC
  Administered 2018-01-10 – 2018-01-12 (×3): 40 mg via ORAL
  Filled 2018-01-09 (×3): qty 1

## 2018-01-09 MED ORDER — NICOTINE 14 MG/24HR TD PT24
14.0000 mg | MEDICATED_PATCH | Freq: Every day | TRANSDERMAL | Status: DC
  Administered 2018-01-09 – 2018-01-12 (×4): 14 mg via TRANSDERMAL
  Filled 2018-01-09 (×4): qty 1

## 2018-01-09 MED ORDER — NEPRO/CARBSTEADY PO LIQD
237.0000 mL | Freq: Two times a day (BID) | ORAL | Status: DC
  Administered 2018-01-11: 237 mL via ORAL
  Filled 2018-01-09 (×7): qty 237

## 2018-01-09 MED ORDER — SODIUM CHLORIDE 0.9 % IV BOLUS
1000.0000 mL | Freq: Once | INTRAVENOUS | Status: AC
  Administered 2018-01-09: 1000 mL via INTRAVENOUS

## 2018-01-09 MED ORDER — ONDANSETRON HCL 4 MG/2ML IJ SOLN
4.0000 mg | Freq: Once | INTRAMUSCULAR | Status: AC
  Administered 2018-01-09: 4 mg via INTRAVENOUS
  Filled 2018-01-09: qty 2

## 2018-01-09 MED ORDER — FLUTICASONE FUROATE-VILANTEROL 200-25 MCG/INH IN AEPB
1.0000 | INHALATION_SPRAY | Freq: Every day | RESPIRATORY_TRACT | Status: DC
  Administered 2018-01-10 – 2018-01-12 (×2): 1 via RESPIRATORY_TRACT
  Filled 2018-01-09: qty 28

## 2018-01-09 MED ORDER — FLUTICASONE FUROATE-VILANTEROL 200-25 MCG/INH IN AEPB
1.0000 | INHALATION_SPRAY | Freq: Every day | RESPIRATORY_TRACT | Status: DC | PRN
  Filled 2018-01-09: qty 28

## 2018-01-09 MED ORDER — CARVEDILOL 25 MG PO TABS
25.0000 mg | ORAL_TABLET | Freq: Two times a day (BID) | ORAL | Status: DC
  Administered 2018-01-09 – 2018-01-12 (×7): 25 mg via ORAL
  Filled 2018-01-09 (×7): qty 1

## 2018-01-09 MED ORDER — CALCIUM ACETATE (PHOS BINDER) 667 MG PO CAPS
667.0000 mg | ORAL_CAPSULE | Freq: Three times a day (TID) | ORAL | Status: DC
  Administered 2018-01-10 – 2018-01-12 (×5): 667 mg via ORAL
  Filled 2018-01-09 (×6): qty 1

## 2018-01-09 MED ORDER — FUROSEMIDE 80 MG PO TABS
80.0000 mg | ORAL_TABLET | Freq: Every morning | ORAL | Status: DC
  Administered 2018-01-10 – 2018-01-12 (×3): 80 mg via ORAL
  Filled 2018-01-09 (×3): qty 1

## 2018-01-09 MED ORDER — PHYTONADIONE 5 MG PO TABS
5.0000 mg | ORAL_TABLET | Freq: Once | ORAL | Status: AC
  Administered 2018-01-09: 5 mg via ORAL
  Filled 2018-01-09: qty 1

## 2018-01-09 MED ORDER — ACETAMINOPHEN 325 MG PO TABS
650.0000 mg | ORAL_TABLET | Freq: Four times a day (QID) | ORAL | Status: DC | PRN
  Administered 2018-01-11: 650 mg via ORAL
  Filled 2018-01-09: qty 2

## 2018-01-09 MED ORDER — ALBUTEROL SULFATE (2.5 MG/3ML) 0.083% IN NEBU: 2.5000 mg | INHALATION_SOLUTION | Freq: Four times a day (QID) | RESPIRATORY_TRACT | Status: DC | PRN

## 2018-01-09 MED ORDER — ALBUTEROL SULFATE (2.5 MG/3ML) 0.083% IN NEBU
2.5000 mg | INHALATION_SOLUTION | Freq: Four times a day (QID) | RESPIRATORY_TRACT | Status: DC
  Administered 2018-01-10 – 2018-01-12 (×7): 2.5 mg via RESPIRATORY_TRACT
  Filled 2018-01-09 (×11): qty 3

## 2018-01-09 MED ORDER — OXYCODONE-ACETAMINOPHEN 5-325 MG PO TABS
1.0000 | ORAL_TABLET | Freq: Four times a day (QID) | ORAL | Status: DC | PRN
  Administered 2018-01-09 – 2018-01-12 (×7): 1 via ORAL
  Filled 2018-01-09 (×6): qty 1

## 2018-01-09 MED ORDER — CALCITRIOL 0.25 MCG PO CAPS: 0.2500 ug | ORAL_CAPSULE | ORAL | Status: DC

## 2018-01-09 NOTE — Progress Notes (Signed)
Nebs/inhaler ordered per what pt states is his home reg and pt states he has not had any of his respiratory meds today. RT to pt's bedside to admin tx's, pt now refusing. States he only wants to sleep. RN aware. RT will continue to monitor.

## 2018-01-09 NOTE — ED Notes (Signed)
Called [facility] with bed assignment and for transport to [facility]. 

## 2018-01-09 NOTE — ED Provider Notes (Addendum)
[facility] EMERGENCY DEPARTMENT Provider Note   CSN: [id number] Arrival date & time: 01/09/18  1238     History   Chief Complaint Chief Complaint  Patient presents with  . Back Pain    HPI Aaron [name] is a 62 y.o. male.  Pt presents to the ED today with back pain.  The pt had fall about 1 month ago and injured his back.  He saw his NS in [place] (Dr. Carloyn Manner) who ordered an MRI.  The MRI showed possible discitis/paraspinal abscess.  Dr. Carloyn Manner called ID and told pt to go to the ED at [facility].  When pt arrived, he was under the assumption that he was a direct admit.  This was not the case.  Dr. Johnnye Sima (ID) saw pt in the ED and recommended to hold abx b/c he does not have a fever.  He consulted IR and NS.  The pt was getting admitted to the hospitalist team, when he decided to leave AMA.  He felt like his pain was not getting adequately treated.  The pt shows up here at [facility] thinking he could get all this complicated treatment here.  The pt is willing to go back to [facility] when I explained that we don't have the capability to treat him here.  The pt is also a dialysis patient and missed his dialysis yesterday since he was in the ED all day.  His last dialysis was on Wednesday, Sept. 25th.  Pt said his back pain is severe and he's unable to ambulate without a walker.     Past Medical History:  Diagnosis Date  . Anxiety   . Arthritis    knees , back , shoulders (10/12/2017)  . Atrial fibrillation ([place])   . CAD (coronary artery disease)    Mild nonobstructive disease at cardiac catheterization 2002  . Chronic back pain    "back of my neck; down thru my legs" (10/12/2017)  . COPD (chronic obstructive pulmonary disease) ([place])   . Diverticulitis   . Esophageal reflux   . ESRD (end stage renal disease) on dialysis [facility])    "TTS; [place]" (11/23/2017)  . Essential hypertension   . Hepatitis C    states he no longer has this  . History of kidney stones   . History of syncope   . Hyperlipidemia   . Jerking  09/23/2014  . Myocardial infarction ([place]) 02/2017   "light one" (10/12/2017)  . PAT (paroxysmal atrial tachycardia) ([place])   . Peripheral arterial disease ([place])    Occluded left superficial femoral artery status post stent June 2016 - Dr. Trula Slade  . Pneumonia 1961  . Polycystic kidney, unspecified type   . Syncope 09/2014  . SYNCOPE 05/07/2010   Qualifier: Diagnosis of  By: Laurance Flatten RN, BSN, [facility]      Patient Active Problem List   Diagnosis Date Noted  . Paraspinal abscess ([place]) 01/08/2018  . ESRD on dialysis ([place]) 12/30/2017  . Bacteremia due to Gram-negative bacteria 12/07/2017  . Hematoma of arm, right, initial encounter 11/23/2017  . Protein-calorie malnutrition, severe 10/15/2017  . Hyperlipidemia 08/24/2017  . Schatzki's ring of distal esophagus 04/27/2017  . Encounter for therapeutic drug monitoring 03/03/2017  . History of hepatitis C 02/20/2017  . Ischemic cardiomyopathy 01/27/2017  . Dyslipidemia 01/27/2017  . AF (paroxysmal atrial fibrillation) ([place]) 01/27/2017  . CAD (coronary artery disease) 01/21/2017  . Acute renal failure (ARF) ([place]) 12/16/2016  . AKI (acute kidney injury) ([place]) 12/15/2016  . CKD (chronic kidney disease),  stage V ([place]) 12/15/2016  . Hepatitis C 12/15/2016  . Polycystic kidney disease 12/14/2016  . COPD (chronic obstructive pulmonary disease) ([place])   . COLD (chronic obstructive lung disease) ([place])   . Depression   . PAD (peripheral artery disease) ([place]) 09/19/2014  . Preop cardiovascular exam 10/28/2010  . Tobacco abuse 10/28/2010  . UNSPECIFIED IRON DEFICIENCY ANEMIA 10/04/2009  . 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  . Essential hypertension 10/04/2009  . Esophageal reflux 10/04/2009  . PRECORDIAL PAIN 10/04/2009    Past Surgical History:  Procedure Laterality Date  . ABDOMINAL AORTOGRAM N/A 08/11/2017   Procedure: ABDOMINAL AORTOGRAM;  Surgeon: Serafina Mitchell, MD;  Location: [place] CV LAB;  Service: Cardiovascular;  Laterality: N/A;  .  ANTERIOR CERVICAL DECOMP/DISCECTOMY FUSION    . APPLICATION OF WOUND VAC Right 08/14/2017   Procedure: APPLICATION OF PREVENA INCISIONAL WOUND VAC RIGHT GROIN;  Surgeon: Serafina Mitchell, MD;  Location: [facility] OR;  Service: Vascular;  Laterality: Right;  . AV FISTULA PLACEMENT Left 09/04/2017   Procedure: ARTERIOVENOUS (AV) FISTULA CREATION LEFT UPPER ARM;  Surgeon: Serafina Mitchell, MD;  Location: [place];  Service: Vascular;  Laterality: Left;  . BACK SURGERY    . BASCILIC VEIN TRANSPOSITION Left 11/20/2017   Procedure: SECOND STAGE BASILIC VEIN TRANSPOSITION LEFT ARM;  Surgeon: Serafina Mitchell, MD;  Location: [place];  Service: Vascular;  Laterality: Left;  . BIOPSY  12/17/2016   Procedure: BIOPSY;  Surgeon: Daneil Dolin, MD;  Location: [facility] ENDO [facility]ITE;  Service: Gastroenterology;;  gastric colon  . BIOPSY  04/29/2017   Procedure: BIOPSY;  Surgeon: Daneil Dolin, MD;  Location: [facility];  Service: Endoscopy;;  duodenal biopsies  . BUNIONECTOMY Bilateral   . COLONOSCOPY  2008   Dr. Oneida Alar: rare sigmoid colon diverticulosis, internal hemorrhoids.   . COLONOSCOPY WITH PROPOFOL N/A 12/17/2016   dense left-sided diverticulosis, right colon ulcers s/p biopsy query occult NSAID use vs transient ischemia, not consistent with IBD. CMV stains negative.   Marland Kitchen ENDARTERECTOMY FEMORAL Right 08/14/2017   Procedure: RIGHT ILLIO-FEMORAL ENDARTERECTOMY;  Surgeon: Serafina Mitchell, MD;  Location: [facility] OR;  Service: Vascular;  Laterality: Right;  . ESOPHAGEAL DILATION  12/17/2016   EGD with mild Schatzki's ring s/p dilatation, small hiatal hernia, erosive gastropathy (negative H.pylori gastritis)  . ESOPHAGOGASTRODUODENOSCOPY  2008   Dr. Oneida Alar: normal esophagus without Barrett's, antritis and duodenitis, path with H.pylori gastritis  . ESOPHAGOGASTRODUODENOSCOPY (EGD) WITH PROPOFOL N/A 12/17/2016   Procedure: ESOPHAGOGASTRODUODENOSCOPY (EGD) WITH PROPOFOL;  Surgeon: Daneil Dolin, MD;  [facility]: [facility];  Service:  Gastroenterology;  Laterality: N/A;  . ESOPHAGOGASTRODUODENOSCOPY (EGD) WITH PROPOFOL N/A 04/29/2017   Patchy erythema of gastric mucosa diffusely, extensive inflammatory changes in duodenum, geographic ulceration and mucosal edema present, encroaching somewhat on the lumen yet still widely patent, distal second portion of duodenum appeared abnormal, path with peptic duodenitis with ulceration  . GROIN DEBRIDEMENT Right 10/14/2017   Procedure: RIGHT GROIN AND RIGHT LOWER QUADRANT ABDOMEN DEBRIDEMENT WITH PLACEMENT OF ANTIBIOTIC BEADS;  Surgeon: Serafina Mitchell, MD;  Location: [place];  Service: Vascular;  Laterality: Right;  . HEMATOMA EVACUATION Left 12/06/2017   Procedure: EVACUATION HEMATOMA;  Surgeon: Waynetta Sandy, MD;  Location: [place];  Service: Vascular;  Laterality: Left;  . HERNIA REPAIR  8413   umbilical  . I&D EXTREMITY Right 09/21/2017   Procedure: IRRIGATION AND DEBRIDEMENT GROIN;  Surgeon: Elam Dutch, MD;  Location: [place];  Service: Vascular;  Laterality: Right;  .  I&D EXTREMITY Left 11/23/2017   Procedure: Evacuation Hematoma LEFT UPPER ARM GRAFT;  Surgeon: Elam Dutch, MD;  Location: [place];  Service: Vascular;  Laterality: Left;  . INGUINAL HERNIA REPAIR Right 02/2017   [place]  . INSERTION OF DIALYSIS CATHETER Right 09/04/2017   Procedure: INSERTION OF TUNNELED  DIALYSIS CATHETER - RIGHT INTERNAL JUGULAR PLACEMENT;  Surgeon: Serafina Mitchell, MD;  Location: [place];  Service: Vascular;  Laterality: Right;  . INSERTION OF ILIAC STENT Right 08/14/2017   Procedure: INSERTION OF RIGHT COMMON ILIAC STENT 60mm x 15mm x 130cm INSERTION OF RIGHT EXTERNAL ILIAC STENT 22mm x 60mm x 130cm INSERTION OF SUPERFICIAL FERMORAL ARTERY STENT 26mm x 39mm x 130cm;  Surgeon: Serafina Mitchell, MD;  Location: [facility] OR;  Service: Vascular;  Laterality: Right;  . IR FLUORO GUIDE CV LINE RIGHT  08/31/2017  . IR US GUIDE VASC ACCESS RIGHT  08/31/2017  . LOWER EXTREMITY ANGIOGRAPHY Right 08/11/2017    Procedure: LOWER EXTREMITY ANGIOGRAPHY;  Surgeon: Serafina Mitchell, MD;  Location: [place] CV LAB;  Service: Cardiovascular;  Laterality: Right;  . PATCH ANGIOPLASTY Right 08/14/2017   Procedure: PATCH ANGIOPLASTY USING HEMASHIELD PATCH 0.3IN Lillie [place];  Surgeon: Serafina Mitchell, MD;  Location: [facility] OR;  Service: Vascular;  Laterality: Right;  . PERIPHERAL VASCULAR CATHETERIZATION N/A 09/20/2014   Procedure: Abdominal Aortogram;  Surgeon: Serafina Mitchell, MD;  Location: [place] CV LAB;  Service: Cardiovascular;  Laterality: N/A;  . POSTERIOR FUSION LUMBAR SPINE          Home Medications    Prior to Admission medications   Medication Sig Start Date End Date Taking? Authorizing Provider  B Complex-C-Folic Acid (DIALYVITE 856) 0.8 MG TABS Take 1 tablet by mouth daily. 09/15/17  Yes [provider]  BREO ELLIPTA 200-25 MCG/INH AEPB Inhale 1 puff into the lungs daily as needed (wheezing).  07/27/17  Yes [provider]  carvedilol (COREG) 25 MG tablet Take 1 tablet (25 mg total) 2 (two) times daily by mouth. 02/25/17 01/09/18 Yes Imogene Burn, PA-C  cholecalciferol (VITAMIN D) 1000 units tablet Take 2,000 Units by mouth daily.   Yes [provider]  clopidogrel (PLAVIX) 75 MG tablet  12/24/17  Yes [provider]  oxyCODONE-acetaminophen (PERCOCET/ROXICET) 5-325 MG tablet Take 1 tablet by mouth every 6 (six) hours as needed for severe pain. 12/30/17  Yes [name], PA-C  pantoprazole (PROTONIX) 40 MG tablet Take 1 tablet (40 mg total) by mouth 2 (two) times daily. 04/30/17 01/09/18 Yes Shah, Pratik D, DO  calcitRIOL (ROCALTROL) 0.25 MCG capsule Take 1 capsule (0.25 mcg total) by mouth Every Tuesday,Thursday,and Saturday with dialysis. 09/08/17   Ghimire, Henreitta Leber, MD  calcium acetate (PHOSLO) 667 MG capsule Take 667 mg by mouth 3 (three) times daily with meals.  09/15/17   [provider]  furosemide (LASIX) 40 MG tablet Take 80 mg by mouth every  morning. 12/23/17   [provider]  metoprolol succinate (TOPROL-XL) 25 MG 24 hr tablet Take by mouth. 09/24/14   [provider]  Nutritional Supplements (FEEDING SUPPLEMENT, NEPRO CARB STEADY,) LIQD Take 237 mLs by mouth 2 (two) times daily between meals. 10/21/17   Rhyne, Hulen Shouts, PA-C  warfarin (COUMADIN) 5 MG tablet Take 1 tablet (5 mg total) by mouth daily. Patient taking differently: Take 2.5-5 mg by mouth See admin instructions. 2.5mg  on Friday and 5mg  on all other days 10/21/17 10/21/18  Gabriel Earing, PA-C  Family History Family History  Problem Relation Age of Onset  . Alcoholism Mother   . Heart disease Father        Massive heart attack  . Heart attack Father   . Atrial fibrillation Father   . Colon cancer Father   . Colon cancer Maternal Grandfather 66  . Alcoholism Maternal Grandfather   . Renal cancer Cousin   . Ovarian cancer Sister     Social History Social History   Tobacco Use  . Smoking status: Former Smoker    Packs/day: 2.00    Years: 47.00    Pack years: 94.00    Types: Cigarettes    Last attempt to quit: 08/03/2017    Years since quitting: 0.4  . Smokeless tobacco: Never Used  Substance Use Topics  . Alcohol use: Not Currently    Comment: 10/12/2017 alcohol free since 2017,  heavy drinker in the past  . Drug use: Not Currently    Comment: "<2003 whatever was around; nothing since"     Allergies   Patient has no known allergies.   Review of Systems Review of Systems  Musculoskeletal: Positive for back pain.  All other systems reviewed and are negative.    Physical Exam Updated Vital Signs BP (!) 149/105   Pulse (!) 115   Temp 98.4 F (36.9 C) (Oral)   Resp 18   Ht 6\' 4"  (1.93 m)   Wt 96.6 kg   SpO2 98%   BMI 25.93 kg/m   Physical Exam  Constitutional: He is oriented to person, place, and time. He appears well-developed and well-nourished.  HENT:  Head: Normocephalic and atraumatic.  Right Ear: External  ear normal.  Left Ear: External ear normal.  Nose: Nose normal.  Mouth/Throat: Oropharynx is clear and moist.  Eyes: Pupils are equal, round, and reactive to light. Conjunctivae and EOM are normal.  Neck: Normal range of motion. Neck supple.  Cardiovascular: Regular rhythm, normal heart sounds and intact distal pulses. Tachycardia present.  Pulmonary/Chest: Effort normal and breath sounds normal.  Dialysis port right upper chest  Abdominal: Soft. Bowel sounds are normal.  Musculoskeletal:       Lumbar back: He exhibits tenderness.  Neurological: He is alert and oriented to person, place, and time.  Skin: Skin is warm. Capillary refill takes less than 2 seconds.  Psychiatric: He has a normal mood and affect. His behavior is normal. Judgment and thought content normal.  Nursing note and vitals reviewed.    ED Treatments / Results  Labs (all labs ordered are listed, but only abnormal results are displayed) Labs Reviewed  COMPREHENSIVE METABOLIC PANEL - Abnormal; Notable for the following components:      Result Value   Sodium 133 (*)    Chloride 97 (*)    Glucose, Bld 111 (*)    BUN 32 (*)    Creatinine, Ser 4.63 (*)    Calcium 8.0 (*)    Albumin 2.5 (*)    AST 12 (*)    Alkaline Phosphatase 149 (*)    GFR calc non Af Amer 12 (*)    GFR calc Af Amer 14 (*)    All other components within normal limits  CBC WITH DIFFERENTIAL/PLATELET - Abnormal; Notable for the following components:   RBC 2.89 (*)    Hemoglobin 8.8 (*)    HCT 27.8 (*)    RDW 19.6 (*)    Neutro Abs 8.4 (*)    All other components within normal limits  PROTIME-INR -  Abnormal; Notable for the following components:   Prothrombin Time 21.1 (*)    All other components within normal limits  CULTURE, BLOOD (ROUTINE X 2)  CULTURE, BLOOD (ROUTINE X 2)  URINALYSIS, ROUTINE W REFLEX MICROSCOPIC    EKG None  Radiology Dg Chest 2 View  Result Date: 01/09/2018 CLINICAL DATA:  Short of breath EXAM: CHEST - 2 VIEW  COMPARISON:  12/04/2017 FINDINGS: Right-sided central venous catheter tip over the SVC. Bilateral hyperinflation. Non inclusion of the CP angles on the lateral view. Streaky atelectasis or scarring at the bases. Stable borderline cardiomegaly. No pneumothorax. IMPRESSION: 1. Non inclusion of the lung bases on lateral view. 2. Streaky atelectasis or scarring at the bases. Electronically Signed   By: Donavan Foil M.D.   On: 01/09/2018 16:16     MRI was not done on Epic, but this is a reading:  MRI shows pyogenic discitis at L1-L2, L4-L5 . He also has a paraspinal absess at L1. These findings are new since the summer.    Procedures Procedures (including critical care time)  Medications Ordered in ED Medications  sodium chloride 0.9 % bolus 1,000 mL (1,000 mLs Intravenous New Bag/Given 01/09/18 1553)  HYDROmorphone (DILAUDID) injection 1 mg (1 mg Intravenous Given 01/09/18 1553)  ondansetron (ZOFRAN) injection 4 mg (4 mg Intravenous Given 01/09/18 1552)     Initial Impression / Assessment and Plan / ED Course  I have reviewed the triage vital signs and the nursing notes.  Pertinent labs & imaging results that were available during my care of the patient were reviewed by me and considered in my medical decision making (see chart for details).    Pt d/w Dr. Baxter Flattery (ID) who still recommended no abx.  She wants him back to [facility].  She said Dr. Barbie Banner (IR) called her today to ask where this pt went.  IR will need to be reconsulted when pt gets to [facility] so they can find him.  Pt d/w Dr. Nehemiah Settle (triad) who will admit to [facility].    Final Clinical Impressions(s) / ED Diagnoses   Final diagnoses:  Discitis of lumbar region  Paraspinal abscess ([place])  ESRD on hemodialysis ([place])  Anemia associated with chronic renal failure    ED Discharge Orders    None       Isla Pence, MD 01/09/18 1608    Isla Pence, MD 01/09/18 [phone]

## 2018-01-09 NOTE — ED Triage Notes (Signed)
Pt at [facility] last night states he was supposed to be admitted per him, states he waited for 4 hours , got "pissed off" and left  States MRI shows a infection in his back And he was supposed to be admitted for same  noone at [facility] knew about his pending admission per pt  Pt reports Dr Carloyn Manner is his physician who told him he was to be admitted

## 2018-01-09 NOTE — ED Notes (Signed)
Report given to Sonia Side with [facility] at this time.

## 2018-01-09 NOTE — H&P (Signed)
History and Physical  Aaron [name] FBP:[id number] DOB: 1955-07-08 DOA: 01/09/2018  Referring physician: Dr Gilford Raid, ED physician PCP: [facility] [place]  Outpatient Specialists:   Patient Coming From: home  Chief Complaint: back pain  HPI: Aaron [name] is a 62 y.o. male with a history of hypertension, COPD, paroxysmal atrial fibrillation on chronic anticoagulation, coronary artery disease with mild nonobstructive disease in 2002, MI 10/2017, end-stage renal disease on dialysis Monday, Wednesday, Friday.  Patient had a fall approximately 4 weeks ago with increasing midline back pain in the lumbar spine since that time.  He has had gradual diminished ability to walk and weight-bear, but denies bladder or bowel dysfunction.  His pain is described as severe and throbbing.  He was evaluated by Dr. Carloyn Manner, neurosurgeon in [place], who ordered a MRI showing discitis and paraspinal abscess.  He was seen in the emergency department yesterday for this but left because of frustrations with his pain management.  No fevers, chills, nausea, vomiting.  Emergency Department Course: CBC: WBC 10.4.  Creatinine 4.63 albumin 2.5.  INR 1.84.  Blood cultures obtained.  Chest x-ray without acute findings.  Review of Systems:   Pt denies any fevers, chills, nausea, vomiting, diarrhea, constipation, abdominal pain, shortness of breath, dyspnea on exertion, orthopnea, cough, wheezing, palpitations, headache, vision changes, lightheadedness, dizziness, melena, rectal bleeding.  Review of systems are otherwise negative  Past Medical History:  Diagnosis Date  . Anxiety   . Arthritis    knees , back , shoulders (10/12/2017)  . Atrial fibrillation ([place])   . CAD (coronary artery disease)    Mild nonobstructive disease at cardiac catheterization 2002  . Chronic back pain    "back of my neck; down thru my legs" (10/12/2017)  . COPD (chronic obstructive pulmonary disease) ([place])   . Diverticulitis   .  Esophageal reflux   . ESRD (end stage renal disease) on dialysis [facility])    "TTS; [place]" (11/23/2017)  . Essential hypertension   . Hepatitis C    states he no longer has this  . History of kidney stones   . History of syncope   . Hyperlipidemia   . Jerking 09/23/2014  . Myocardial infarction ([place]) 02/2017   "light one" (10/12/2017)  . PAT (paroxysmal atrial tachycardia) ([place])   . Peripheral arterial disease ([place])    Occluded left superficial femoral artery status post stent June 2016 - Dr. Trula Slade  . Pneumonia 1961  . Polycystic kidney, unspecified type   . Syncope 09/2014  . SYNCOPE 05/07/2010   Qualifier: Diagnosis of  By: Laurance Flatten RN, BSN, Anderson Malta     Past Surgical History:  Procedure Laterality Date  . ABDOMINAL AORTOGRAM N/A 08/11/2017   Procedure: ABDOMINAL AORTOGRAM;  Surgeon: Serafina Mitchell, MD;  Location: [place] [facility];  Service: Cardiovascular;  Laterality: N/A;  . ANTERIOR CERVICAL DECOMP/DISCECTOMY FUSION    . APPLICATION OF WOUND VAC Right 08/14/2017   Procedure: APPLICATION OF PREVENA INCISIONAL WOUND VAC RIGHT GROIN;  Surgeon: Serafina Mitchell, MD;  [facility];  Service: Vascular;  Laterality: Right;  . AV FISTULA PLACEMENT Left 09/04/2017   Procedure: ARTERIOVENOUS (AV) FISTULA CREATION LEFT UPPER ARM;  Surgeon: Serafina Mitchell, MD;  Location: [place];  Service: Vascular;  Laterality: Left;  . BACK SURGERY    . BASCILIC VEIN TRANSPOSITION Left 11/20/2017   Procedure: SECOND STAGE BASILIC VEIN TRANSPOSITION LEFT ARM;  Surgeon: Serafina Mitchell, MD;  Location: [place];  Service: Vascular;  Laterality:  Left;  . BIOPSY  12/17/2016   Procedure: BIOPSY;  Surgeon: Daneil Dolin, MD;  [facility];  Service: Gastroenterology;;  gastric colon  . BIOPSY  04/29/2017   Procedure: BIOPSY;  Surgeon: Daneil Dolin, MD;  [facility];  Service: Endoscopy;;  duodenal biopsies  . BUNIONECTOMY Bilateral   . COLONOSCOPY  2008   Dr. Oneida Alar: rare sigmoid colon  diverticulosis, internal hemorrhoids.   . COLONOSCOPY WITH PROPOFOL N/A 12/17/2016   dense left-sided diverticulosis, right colon ulcers s/p biopsy query occult NSAID use vs transient ischemia, not consistent with IBD. CMV stains negative.   Marland Kitchen ENDARTERECTOMY FEMORAL Right 08/14/2017   Procedure: RIGHT ILLIO-FEMORAL ENDARTERECTOMY;  Surgeon: Serafina Mitchell, MD;  Location: [facility] OR;  Service: Vascular;  Laterality: Right;  . ESOPHAGEAL DILATION  12/17/2016   EGD with mild Schatzki's ring s/p dilatation, small hiatal hernia, erosive gastropathy (negative H.pylori gastritis)  . ESOPHAGOGASTRODUODENOSCOPY  2008   Dr. Oneida Alar: normal esophagus without Barrett's, antritis and duodenitis, path with H.pylori gastritis  . ESOPHAGOGASTRODUODENOSCOPY (EGD) WITH PROPOFOL N/A 12/17/2016   Procedure: ESOPHAGOGASTRODUODENOSCOPY (EGD) WITH PROPOFOL;  Surgeon: Daneil Dolin, MD;  [facility];  Service: Gastroenterology;  Laterality: N/A;  . ESOPHAGOGASTRODUODENOSCOPY (EGD) WITH PROPOFOL N/A 04/29/2017   Patchy erythema of gastric mucosa diffusely, extensive inflammatory changes in duodenum, geographic ulceration and mucosal edema present, encroaching somewhat on the lumen yet still widely patent, distal second portion of duodenum appeared abnormal, path with peptic duodenitis with ulceration  . GROIN DEBRIDEMENT Right 10/14/2017   Procedure: RIGHT GROIN AND RIGHT LOWER QUADRANT ABDOMEN DEBRIDEMENT WITH PLACEMENT OF ANTIBIOTIC BEADS;  Surgeon: Serafina Mitchell, MD;  Location: [place];  Service: Vascular;  Laterality: Right;  . HEMATOMA EVACUATION Left 12/06/2017   Procedure: EVACUATION HEMATOMA;  Surgeon: Waynetta Sandy, MD;  Location: [place];  Service: Vascular;  Laterality: Left;  . HERNIA REPAIR  0932   umbilical  . I&D EXTREMITY Right 09/21/2017   Procedure: IRRIGATION AND DEBRIDEMENT GROIN;  Surgeon: Elam Dutch, MD;  Location[facility] OR;  Service: Vascular;  Laterality: Right;  . I&D EXTREMITY Left  11/23/2017   Procedure: Evacuation Hematoma LEFT UPPER ARM GRAFT;  Surgeon: Elam Dutch, MD;  Location: [place];  Service: Vascular;  Laterality: Left;  . INGUINAL HERNIA REPAIR Right 02/2017   [place]  . INSERTION OF DIALYSIS CATHETER Right 09/04/2017   Procedure: INSERTION OF TUNNELED  DIALYSIS CATHETER - RIGHT INTERNAL JUGULAR PLACEMENT;  Surgeon: Serafina Mitchell, MD;  Location: [place];  Service: Vascular;  Laterality: Right;  . INSERTION OF ILIAC STENT Right 08/14/2017   Procedure: INSERTION OF RIGHT COMMON ILIAC STENT 92mm x 61mm x 130cm INSERTION OF RIGHT EXTERNAL ILIAC STENT 52mm x 61mm x 130cm INSERTION OF SUPERFICIAL FERMORAL ARTERY STENT 71mm x 37mm x 130cm;  Surgeon: Serafina Mitchell, MD;  Location: [facility] OR;  Service: Vascular;  Laterality: Right;  . IR FLUORO GUIDE CV LINE RIGHT  08/31/2017  . IR US GUIDE VASC ACCESS RIGHT  08/31/2017  . LOWER EXTREMITY ANGIOGRAPHY Right 08/11/2017   Procedure: LOWER EXTREMITY ANGIOGRAPHY;  Surgeon: Serafina Mitchell, MD;  Location: [place] [facility];  Service: Cardiovascular;  Laterality: Right;  . PATCH ANGIOPLASTY Right 08/14/2017   Procedure: PATCH ANGIOPLASTY USING HEMASHIELD PATCH 0.3IN Lillie Columbia;  Surgeon: Serafina Mitchell, MD;  [facility];  Service: Vascular;  Laterality: Right;  . PERIPHERAL VASCULAR CATHETERIZATION N/A 09/20/2014   Procedure: Abdominal Aortogram;  Surgeon:  Serafina Mitchell, MD;  Location: [place] [facility];  Service: Cardiovascular;  Laterality: N/A;  . POSTERIOR FUSION LUMBAR SPINE     Social History:  reports that he quit smoking about 5 months ago. His smoking use included cigarettes. He has a 94.00 pack-year smoking history. He has never used smokeless tobacco. He reports that he drank alcohol. He reports that he has current or past drug history. Patient lives at home  No Known Allergies  Family History  Problem Relation Age of Onset  . Alcoholism Mother   . Heart disease Father        Massive heart attack  . Heart attack  Father   . Atrial fibrillation Father   . Colon cancer Father   . Colon cancer Maternal Grandfather 30  . Alcoholism Maternal Grandfather   . Renal cancer Cousin   . Ovarian cancer Sister       Prior to Admission medications   Medication Sig Start Date End Date Taking? Authorizing Provider  B Complex-C-Folic Acid (DIALYVITE 161) 0.8 MG TABS Take 1 tablet by mouth daily. 09/15/17  Yes [provider]  BREO ELLIPTA 200-25 MCG/INH AEPB Inhale 1 puff into the lungs daily as needed (wheezing).  07/27/17  Yes [provider]  carvedilol (COREG) 25 MG tablet Take 1 tablet (25 mg total) 2 (two) times daily by mouth. 02/25/17 01/09/18 Yes Imogene Burn, PA-C  cholecalciferol (VITAMIN D) 1000 units tablet Take 2,000 Units by mouth daily.   Yes [provider]  clopidogrel (PLAVIX) 75 MG tablet  12/24/17  Yes [provider]  oxyCODONE-acetaminophen (PERCOCET/ROXICET) 5-325 MG tablet Take 1 tablet by mouth every 6 (six) hours as needed for severe pain. 12/30/17  Yes [name], PA-C  pantoprazole (PROTONIX) 40 MG tablet Take 1 tablet (40 mg total) by mouth 2 (two) times daily. 04/30/17 01/09/18 Yes Shah, Pratik D, DO  calcitRIOL (ROCALTROL) 0.25 MCG capsule Take 1 capsule (0.25 mcg total) by mouth Every Tuesday,Thursday,and Saturday with dialysis. 09/08/17   Ghimire, Henreitta Leber, MD  calcium acetate (PHOSLO) 667 MG capsule Take 667 mg by mouth 3 (three) times daily with meals.  09/15/17   [provider]  furosemide (LASIX) 40 MG tablet Take 80 mg by mouth every morning. 12/23/17   [provider]  metoprolol succinate (TOPROL-XL) 25 MG 24 hr tablet Take by mouth. 09/24/14   [provider]  Nutritional Supplements (FEEDING SUPPLEMENT, NEPRO CARB STEADY,) LIQD Take 237 mLs by mouth 2 (two) times daily between meals. 10/21/17   Rhyne, Hulen Shouts, PA-C  warfarin (COUMADIN) 5 MG tablet Take 1 tablet (5 mg total) by mouth daily. Patient taking  differently: Take 2.5-5 mg by mouth See admin instructions. 2.5mg  on Friday and 5mg  on all other days 10/21/17 10/21/18  Gabriel Earing, PA-C    Physical Exam: BP (!) 151/102   Pulse (!) 103   Temp 98.4 F (36.9 C) (Oral)   Resp 20   Ht 6\' 4"  (1.93 m)   Wt 96.6 kg   SpO2 99%   BMI 25.93 kg/m   . General: Middle-aged male. Awake and alert and oriented x3. No acute cardiopulmonary distress.  Marland Kitchen HEENT: Normocephalic atraumatic.  Right and left ears normal in appearance.  Pupils equal, round, reactive to light. Extraocular muscles are intact. Sclerae anicteric and noninjected.  Moist mucosal membranes. No mucosal lesions.  . Neck: Neck supple without lymphadenopathy. No carotid bruits. No masses palpated.  . Cardiovascular: Regular rate with normal  S1-S2 sounds. No murmurs, rubs, gallops auscultated. No JVD.  Marland Kitchen Respiratory: Coarse breath sounds with expiratory wheezing.  No accessory muscle use. . Abdomen: Soft, nontender, nondistended. Active bowel sounds. No masses or hepatosplenomegaly  . Skin: No rashes, lesions, or ulcerations.  Dry, warm to touch. 2+ dorsalis pedis and radial pulses. . Musculoskeletal: No calf or leg pain. All major joints not erythematous nontender.  No upper or lower joint deformation.  Good ROM.  No contractures  . Psychiatric: Intact judgment and insight. Pleasant and cooperative. . Neurologic: No focal neurological deficits. Strength is 5/5 and symmetric in upper and lower extremities.  Cranial nerves II through XII are grossly intact.           Labs on Admission: I have personally reviewed following labs and imaging studies  CBC: Recent Labs  Lab 01/09/18 1524  WBC 10.4  NEUTROABS 8.4*  HGB 8.8*  HCT 27.8*  MCV 96.2  PLT 176   Basic Metabolic Panel: Recent Labs  Lab 01/09/18 1524  NA 133*  K 4.3  CL 97*  CO2 26  GLUCOSE 111*  BUN 32*  CREATININE 4.63*  CALCIUM 8.0*   GFR: Estimated Creatinine Clearance: 20.6 mL/min (A) (by C-G formula  based on SCr of 4.63 mg/dL (H)). Liver Function Tests: Recent Labs  Lab 01/09/18 1524  AST 12*  ALT 8  ALKPHOS 149*  BILITOT 0.4  PROT 7.0  ALBUMIN 2.5*   No results for input(s): LIPASE, AMYLASE in the last 168 hours. No results for input(s): AMMONIA in the last 168 hours. Coagulation Profile: Recent Labs  Lab 01/06/18 01/09/18 1524  INR 1.8* 1.84   Cardiac Enzymes: No results for input(s): CKTOTAL, CKMB, CKMBINDEX, TROPONINI in the last 168 hours. BNP (last 3 results) No results for input(s): PROBNP in the last 8760 hours. HbA1C: No results for input(s): HGBA1C in the last 72 hours. CBG: No results for input(s): GLUCAP in the last 168 hours. Lipid Profile: No results for input(s): CHOL, HDL, LDLCALC, TRIG, CHOLHDL, LDLDIRECT in the last 72 hours. Thyroid Function Tests: No results for input(s): TSH, T4TOTAL, FREET4, T3FREE, THYROIDAB in the last 72 hours. Anemia Panel: No results for input(s): VITAMINB12, FOLATE, FERRITIN, TIBC, IRON, RETICCTPCT in the last 72 hours. Urine analysis:    Component Value Date/Time   COLORURINE YELLOW 09/19/2017 0701   APPEARANCEUR CLEAR 09/19/2017 0701   LABSPEC 1.008 09/19/2017 0701   PHURINE 6.0 09/19/2017 0701   GLUCOSEU NEGATIVE 09/19/2017 0701   HGBUR SMALL (A) 09/19/2017 0701   BILIRUBINUR NEGATIVE 09/19/2017 0701   KETONESUR NEGATIVE 09/19/2017 0701   PROTEINUR 30 (A) 09/19/2017 0701   UROBILINOGEN 0.2 03/03/2010 1056   NITRITE NEGATIVE 09/19/2017 0701   LEUKOCYTESUR NEGATIVE 09/19/2017 0701   Sepsis Labs: @LABRCNTIP (procalcitonin:4,lacticidven:4) ) Recent Results (from the past 240 hour(s))  Culture, blood (Routine X 2) w Reflex to ID Panel     Status: None (Preliminary result)   Collection Time: 01/08/18  6:55 PM  Result Value Ref Range Status   Specimen Description BLOOD RIGHT FOREARM  Final   Special Requests   Final    BOTTLES DRAWN AEROBIC AND ANAEROBIC Blood Culture results may not be optimal due to an  inadequate volume of blood received in culture bottles   Culture   Final    NO GROWTH < 24 HOURS Performed at [facility], [place] [address], [place], [place] [address]    Report Status PENDING  Incomplete     Radiological Exams on Admission: Dg  Chest 2 View  Result Date: 01/09/2018 CLINICAL DATA:  Short of breath EXAM: CHEST - 2 VIEW COMPARISON:  12/04/2017 FINDINGS: Right-sided central venous catheter tip over the SVC. Bilateral hyperinflation. Non inclusion of the CP angles on the lateral view. Streaky atelectasis or scarring at the bases. Stable borderline cardiomegaly. No pneumothorax. IMPRESSION: 1. Non inclusion of the lung bases on lateral view. 2. Streaky atelectasis or scarring at the bases. Electronically Signed   By: Donavan Foil M.D.   On: 01/09/2018 16:16    Assessment/Plan: Principal Problem:   Discitis of lumbar region Active Problems:   Essential hypertension   Esophageal reflux   Tobacco abuse   COPD (chronic obstructive pulmonary disease) ([facility])   CAD (coronary artery disease)   AF (paroxysmal atrial fibrillation) ([facility])   ESRD on dialysis [facility])   Paraspinal abscess ([place])    This patient was discussed with the ED physician, including pertinent vitals, physical exam findings, labs, and imaging.  We also discussed care given by the ED provider.  1. Discitis of lumbar region a. Will hold antibiotics as patient is stable b. Discussed patient with interventional radiology who will obtain fluid tomorrow as abscesses are too small to drain c. Blood cultures pending d. ID to see in consult e. CBC tomorrow f. Hold Coumadin and Plavix g. INR needs to be normalized: We will give vitamin K 5 mg now and check INR in the morning 2. Paraspinal abscess 3. Atrial fibrillation a. Rate controlled b. Hold Coumadin for now 4. End-stage renal disease on dialysis a. Dialysis per nephrology 5. Coronary artery disease a. Hold Plavix for now 6. COPD a. Continue home  inhalers 7. Tobacco abuse a. NicoDerm patch 8. GERD a. Continue Protonix 9. Hypertension a. Continue carvedilol  DVT prophylaxis: SCDs Consultants: IR, infectious disease Code Status: Full code Family Communication: None Disposition Plan:    Truett Mainland, DO [facility] Pager [phone]  If 7PM-7AM, please contact night-coverage [email or web address] Password [email or web address]

## 2018-01-10 ENCOUNTER — Inpatient Hospital Stay (HOSPITAL_COMMUNITY): Payer: 59

## 2018-01-10 ENCOUNTER — Other Ambulatory Visit: Payer: Self-pay

## 2018-01-10 ENCOUNTER — Encounter (HOSPITAL_COMMUNITY): Payer: Self-pay | Admitting: *Deleted

## 2018-01-10 DIAGNOSIS — N189 Chronic kidney disease, unspecified: Secondary | ICD-10-CM

## 2018-01-10 DIAGNOSIS — I480 Paroxysmal atrial fibrillation: Secondary | ICD-10-CM

## 2018-01-10 DIAGNOSIS — D631 Anemia in chronic kidney disease: Secondary | ICD-10-CM

## 2018-01-10 LAB — BASIC METABOLIC PANEL
Anion gap: 9 (ref 5–15)
BUN: 33 mg/dL — AB (ref 8–23)
CALCIUM: 7.7 mg/dL — AB (ref 8.9–10.3)
CO2: 24 mmol/L (ref 22–32)
CREATININE: 4.58 mg/dL — AB (ref 0.61–1.24)
Chloride: 103 mmol/L (ref 98–111)
GFR calc Af Amer: 15 mL/min — ABNORMAL LOW (ref >60)
GFR calc non Af Amer: 13 mL/min — ABNORMAL LOW (ref >60)
GLUCOSE: 100 mg/dL — AB (ref 70–99)
POTASSIUM: 4.4 mmol/L (ref 3.5–5.1)
Sodium: 136 mmol/L (ref 135–145)

## 2018-01-10 LAB — CBC
HCT: 24.5 % — ABNORMAL LOW (ref 39.0–52.0)
Hemoglobin: 7.5 g/dL — ABNORMAL LOW (ref 13.0–17.0)
MCH: 30.1 pg (ref 26.0–34.0)
MCHC: 30.6 g/dL (ref 30.0–36.0)
MCV: 98.4 fL (ref 78.0–100.0)
PLATELETS: 277 10*3/uL (ref 150–400)
RBC: 2.49 MIL/uL — ABNORMAL LOW (ref 4.22–5.81)
RDW: 19.7 % — AB (ref 11.5–15.5)
WBC: 7 10*3/uL (ref 4.0–10.5)

## 2018-01-10 LAB — GLUCOSE, CAPILLARY
GLUCOSE-CAPILLARY: 120 mg/dL — AB (ref 70–99)
Glucose-Capillary: 89 mg/dL (ref 70–99)
Glucose-Capillary: 87 mg/dL (ref 70–99)
Glucose-Capillary: 113 mg/dL — ABNORMAL HIGH (ref 70–99)

## 2018-01-10 LAB — HEPARIN LEVEL (UNFRACTIONATED): Heparin Unfractionated: 0.15 IU/mL — ABNORMAL LOW (ref 0.30–0.70)

## 2018-01-10 LAB — PROTIME-INR
INR: 1.52
Prothrombin Time: 18.2 seconds — ABNORMAL HIGH (ref 11.4–15.2)

## 2018-01-10 LAB — HIV ANTIBODY (ROUTINE TESTING W REFLEX): HIV Screen 4th Generation wRfx: NONREACTIVE

## 2018-01-10 MED ORDER — SODIUM CHLORIDE 0.9 % IV SOLN
1.0000 g | Freq: Once | INTRAVENOUS | Status: AC
  Administered 2018-01-10: 1 g via INTRAVENOUS
  Filled 2018-01-10: qty 1

## 2018-01-10 MED ORDER — BACITRACIN ZINC 500 UNIT/GM EX OINT
TOPICAL_OINTMENT | Freq: Every day | CUTANEOUS | Status: DC
  Administered 2018-01-10 – 2018-01-11 (×2): via TOPICAL
  Filled 2018-01-10: qty 28.4

## 2018-01-10 MED ORDER — SODIUM CHLORIDE 0.9 % IV SOLN
2.0000 g | INTRAVENOUS | Status: DC
  Administered 2018-01-11: 2 g via INTRAVENOUS
  Filled 2018-01-10: qty 2

## 2018-01-10 MED ORDER — VANCOMYCIN HCL IN DEXTROSE 1-5 GM/200ML-% IV SOLN
1000.0000 mg | INTRAVENOUS | Status: DC
  Administered 2018-01-11: 1000 mg via INTRAVENOUS
  Filled 2018-01-10: qty 200

## 2018-01-10 MED ORDER — VANCOMYCIN HCL 10 G IV SOLR
2000.0000 mg | Freq: Once | INTRAVENOUS | Status: AC
  Administered 2018-01-10: 2000 mg via INTRAVENOUS
  Filled 2018-01-10: qty 2000

## 2018-01-10 MED ORDER — FENTANYL CITRATE (PF) 100 MCG/2ML IJ SOLN
INTRAMUSCULAR | Status: AC
  Filled 2018-01-10: qty 4

## 2018-01-10 MED ORDER — HEPARIN (PORCINE) IN NACL 100-0.45 UNIT/ML-% IJ SOLN
1800.0000 [IU]/h | INTRAMUSCULAR | Status: DC
  Administered 2018-01-10: 1400 [IU]/h via INTRAVENOUS
  Administered 2018-01-11: 1600 [IU]/h via INTRAVENOUS
  Administered 2018-01-11 – 2018-01-12 (×3): 1800 [IU]/h via INTRAVENOUS
  Filled 2018-01-10 (×5): qty 250

## 2018-01-10 MED ORDER — FENTANYL CITRATE (PF) 100 MCG/2ML IJ SOLN
INTRAMUSCULAR | Status: AC | PRN
  Administered 2018-01-10: 50 ug via INTRAVENOUS

## 2018-01-10 MED ORDER — MIDAZOLAM HCL 2 MG/2ML IJ SOLN
INTRAMUSCULAR | Status: AC | PRN
  Administered 2018-01-10: 0.5 mg via INTRAVENOUS
  Administered 2018-01-10: 1 mg via INTRAVENOUS

## 2018-01-10 MED ORDER — MIDAZOLAM HCL 2 MG/2ML IJ SOLN
INTRAMUSCULAR | Status: AC
  Filled 2018-01-10: qty 4

## 2018-01-10 MED ORDER — CHLORHEXIDINE GLUCONATE CLOTH 2 % EX PADS
6.0000 | MEDICATED_PAD | Freq: Every day | CUTANEOUS | Status: DC
  Administered 2018-01-11 – 2018-01-12 (×2): 6 via TOPICAL

## 2018-01-10 MED ORDER — LIDOCAINE HCL 1 % IJ SOLN
INTRAMUSCULAR | Status: AC
  Filled 2018-01-10: qty 20

## 2018-01-10 NOTE — Consult Note (Addendum)
I have seen and examined this patient and agree with the plan of care, Patient uses [facility] for dialysis has had multiple attempts at graft placement. Now has paraspinal abscess and is complaining of back pain. We shall continue on the dialysis schedule and will obtain records in the morning  Certainly not for dialysis today   Sherril Croon 01/10/2018, 2:32 PM [facility] Renal Consultation Note    Indication for Consultation:  Management of ESRD/hemodialysis; anemia, hypertension/volume and secondary hyperparathyroidism PCP: [facility] Nephrologist: Dr. Auburn Bilberry  HPI: Aaron [name] is a 62 y.o. male with ESRD on hemodialysis since May, 2019. PMH of ADPKD, HTN, PAF on coumadin, COPD (current smoker), Hepatitis C, PAD, AOCD, SHPT. Per pt, last HD 01/08/2018 however he told another physician he missed HD Friday. Need to get records from clinic.   Patient presented to ED 01/08/2018 for paraspinal abscess after being seen by Dr. Carloyn Manner in [place] who ordered MRI which revealed discitis and paraspinal abcess. He was seen by ID but signed out AMA D/T issues with pain medications. He came back to ED 01/09/2018 and has been admitted for paraspinal abscess. K+ 4.3 SCr 4.63 BUN 32 Ca 8.0 HGB 8.8, CXR without acute processes. BC pending, currently not on ABX. He underwent CT guided paraspinal aspiration today, cultures pending. ID following.   He currently still C/Os back pain on IV dilaudid although he appears to be resting comfortably. Denies fever, chills, N,V,D, chest pain, SOB, abdominal pain, flank pain, headache, vision changes.   Past Medical History:  Diagnosis Date  . Anxiety   . Arthritis    knees , back , shoulders (10/12/2017)  . Atrial fibrillation ([place])   . CAD (coronary artery disease)    Mild nonobstructive disease at cardiac catheterization 2002  . Chronic back pain    "back of my neck; down thru my legs" (10/12/2017)  . COPD (chronic obstructive  pulmonary disease) ([place])   . Diverticulitis   . Esophageal reflux   . ESRD (end stage renal disease) on dialysis [facility])    "TTS; [facility]" (11/23/2017)  . Essential hypertension   . Hepatitis C    states he no longer has this  . History of kidney stones   . History of syncope   . Hyperlipidemia   . Jerking 09/23/2014  . Myocardial infarction ([place]) 02/2017   "light one" (10/12/2017)  . PAT (paroxysmal atrial tachycardia) ([place])   . Peripheral arterial disease ([place])    Occluded left superficial femoral artery status post stent June 2016 - Dr. Trula Slade  . Pneumonia 1961  . Polycystic kidney, unspecified type   . Syncope 09/2014  . SYNCOPE 05/07/2010   Qualifier: Diagnosis of  By: Laurance Flatten RN, BSN, Anderson Malta     Past Surgical History:  Procedure Laterality Date  . ABDOMINAL AORTOGRAM N/A 08/11/2017   Procedure: ABDOMINAL AORTOGRAM;  Surgeon: Serafina Mitchell, MD;  Location: [place] CV LAB;  Service: Cardiovascular;  Laterality: N/A;  . ANTERIOR CERVICAL DECOMP/DISCECTOMY FUSION    . APPLICATION OF WOUND VAC Right 08/14/2017   Procedure: APPLICATION OF PREVENA INCISIONAL WOUND VAC RIGHT GROIN;  Surgeon: Serafina Mitchell, MD;  [facility];  Service: Vascular;  Laterality: Right;  . AV FISTULA PLACEMENT Left 09/04/2017   Procedure: ARTERIOVENOUS (AV) FISTULA CREATION LEFT UPPER ARM;  Surgeon: Serafina Mitchell, MD;  Location: [place];  Service: Vascular;  Laterality: Left;  . BACK SURGERY    . BASCILIC VEIN TRANSPOSITION Left 11/20/2017  Procedure: SECOND STAGE BASILIC VEIN TRANSPOSITION LEFT ARM;  Surgeon: Serafina Mitchell, MD;  Location: [place];  Service: Vascular;  Laterality: Left;  . BIOPSY  12/17/2016   Procedure: BIOPSY;  Surgeon: Daneil Dolin, MD;  [facility];  Service: Gastroenterology;;  gastric colon  . BIOPSY  04/29/2017   Procedure: BIOPSY;  Surgeon: Daneil Dolin, MD;  [facility];  Service: Endoscopy;;  duodenal biopsies  . BUNIONECTOMY Bilateral   .  COLONOSCOPY  2008   Dr. Oneida Alar: rare sigmoid colon diverticulosis, internal hemorrhoids.   . COLONOSCOPY WITH PROPOFOL N/A 12/17/2016   dense left-sided diverticulosis, right colon ulcers s/p biopsy query occult NSAID use vs transient ischemia, not consistent with IBD. CMV stains negative.   Marland Kitchen ENDARTERECTOMY FEMORAL Right 08/14/2017   Procedure: RIGHT ILLIO-FEMORAL ENDARTERECTOMY;  Surgeon: Serafina Mitchell, MD;  Location: [facility] OR;  Service: Vascular;  Laterality: Right;  . ESOPHAGEAL DILATION  12/17/2016   EGD with mild Schatzki's ring s/p dilatation, small hiatal hernia, erosive gastropathy (negative H.pylori gastritis)  . ESOPHAGOGASTRODUODENOSCOPY  2008   Dr. Oneida Alar: normal esophagus without Barrett's, antritis and duodenitis, path with H.pylori gastritis  . ESOPHAGOGASTRODUODENOSCOPY (EGD) WITH PROPOFOL N/A 12/17/2016   Procedure: ESOPHAGOGASTRODUODENOSCOPY (EGD) WITH PROPOFOL;  Surgeon: Daneil Dolin, MD;  [facility];  Service: Gastroenterology;  Laterality: N/A;  . ESOPHAGOGASTRODUODENOSCOPY (EGD) WITH PROPOFOL N/A 04/29/2017   Patchy erythema of gastric mucosa diffusely, extensive inflammatory changes in duodenum, geographic ulceration and mucosal edema present, encroaching somewhat on the lumen yet still widely patent, distal second portion of duodenum appeared abnormal, path with peptic duodenitis with ulceration  . GROIN DEBRIDEMENT Right 10/14/2017   Procedure: RIGHT GROIN AND RIGHT LOWER QUADRANT ABDOMEN DEBRIDEMENT WITH PLACEMENT OF ANTIBIOTIC BEADS;  Surgeon: Serafina Mitchell, MD;  Location: [place];  Service: Vascular;  Laterality: Right;  . HEMATOMA EVACUATION Left 12/06/2017   Procedure: EVACUATION HEMATOMA;  Surgeon: Waynetta Sandy, MD;  Location: [place];  Service: Vascular;  Laterality: Left;  . HERNIA REPAIR  [id number]   umbilical  . I&D EXTREMITY Right 09/21/2017   Procedure: IRRIGATION AND DEBRIDEMENT GROIN;  Surgeon: Elam Dutch, MD;  Location: [facility] OR;  Service:  Vascular;  Laterality: Right;  . I&D EXTREMITY Left 11/23/2017   Procedure: Evacuation Hematoma LEFT UPPER ARM GRAFT;  Surgeon: Elam Dutch, MD;  Location: [place];  Service: Vascular;  Laterality: Left;  . INGUINAL HERNIA REPAIR Right 02/2017   [place]  . INSERTION OF DIALYSIS CATHETER Right 09/04/2017   Procedure: INSERTION OF TUNNELED  DIALYSIS CATHETER - RIGHT INTERNAL JUGULAR PLACEMENT;  Surgeon: Serafina Mitchell, MD;  Location: [place];  Service: Vascular;  Laterality: Right;  . INSERTION OF ILIAC STENT Right 08/14/2017   Procedure: INSERTION OF RIGHT COMMON ILIAC STENT 40mm x 16mm x 130cm INSERTION OF RIGHT EXTERNAL ILIAC STENT 29mm x 62mm x 130cm INSERTION OF SUPERFICIAL FERMORAL ARTERY STENT 85mm x 30mm x 130cm;  Surgeon: Serafina Mitchell, MD;  Location: [facility] OR;  Service: Vascular;  Laterality: Right;  . IR FLUORO GUIDE CV LINE RIGHT  08/31/2017  . IR US GUIDE VASC ACCESS RIGHT  08/31/2017  . LOWER EXTREMITY ANGIOGRAPHY Right 08/11/2017   Procedure: LOWER EXTREMITY ANGIOGRAPHY;  Surgeon: Serafina Mitchell, MD;  Location: [place] CV LAB;  Service: Cardiovascular;  Laterality: Right;  . PATCH ANGIOPLASTY Right 08/14/2017   Procedure: PATCH ANGIOPLASTY USING HEMASHIELD PATCH 0.3IN Lillie Columbia;  Surgeon: Serafina Mitchell, MD;  [facility];  Service: Vascular;  Laterality: Right;  . PERIPHERAL VASCULAR CATHETERIZATION N/A 09/20/2014   Procedure: Abdominal Aortogram;  Surgeon: Serafina Mitchell, MD;  Location: [place] CV LAB;  Service: Cardiovascular;  Laterality: N/A;  . POSTERIOR FUSION LUMBAR SPINE     Family History  Problem Relation Age of Onset  . Alcoholism Mother   . Heart disease Father        Massive heart attack  . Heart attack Father   . Atrial fibrillation Father   . Colon cancer Father   . Colon cancer Maternal Grandfather 6  . Alcoholism Maternal Grandfather   . Renal cancer Cousin   . Ovarian cancer Sister    Social History:  reports that he has been smoking cigarettes. He  has a 94.00 pack-year smoking history. He has never used smokeless tobacco. He reports that he drank alcohol. He reports that he has current or past drug history. No Known Allergies Prior to Admission medications   Medication Sig Start Date End Date Taking? Authorizing Provider  B Complex-C-Folic Acid (DIALYVITE 253) 0.8 MG TABS Take 1 tablet by mouth daily. 09/15/17  Yes [provider]  BREO ELLIPTA 200-25 MCG/INH AEPB Inhale 1 puff into the lungs every evening.  07/27/17  Yes [provider]  calcitRIOL (ROCALTROL) 0.25 MCG capsule Take 1 capsule (0.25 mcg total) by mouth Every Tuesday,Thursday,and Saturday with dialysis. 09/08/17  Yes Ghimire, Henreitta Leber, MD  calcium acetate (PHOSLO) 667 MG capsule Take 667 mg by mouth at bedtime. Prescribed three times daily with meals 09/15/17  Yes [provider]  carvedilol (COREG) 25 MG tablet Take 1 tablet (25 mg total) 2 (two) times daily by mouth. Patient taking differently: Take 25 mg by mouth at bedtime. Prescribed one tablet twice daily 02/25/17 01/09/18 Yes Imogene Burn, PA-C  cholecalciferol (VITAMIN D) 1000 units tablet Take 2,000 Units by mouth daily.   Yes [provider]  clopidogrel (PLAVIX) 75 MG tablet Take 75 mg by mouth daily.  12/24/17  Yes [provider]  furosemide (LASIX) 40 MG tablet Take 40 mg by mouth every morning.  12/23/17  Yes [provider]  oxyCODONE-acetaminophen (PERCOCET) 10-325 MG tablet Take 1 tablet by mouth every 6 (six) hours as needed for pain.   Yes [provider]  pantoprazole (PROTONIX) 40 MG tablet Take 1 tablet (40 mg total) by mouth 2 (two) times daily. 04/30/17 01/09/18 Yes Shah, Pratik D, DO  metoprolol succinate (TOPROL-XL) 25 MG 24 hr tablet Take 25 mg by mouth daily.  09/24/14   [provider]  Nutritional Supplements (FEEDING SUPPLEMENT, NEPRO CARB STEADY,) LIQD Take 237 mLs by mouth 2 (two) times daily between meals. 10/21/17   Rhyne,  Hulen Shouts, PA-C  warfarin (COUMADIN) 5 MG tablet Take 1 tablet (5 mg total) by mouth daily. Patient not taking: Reported on 01/09/2018 10/21/17 10/21/18  Gabriel Earing, PA-C   Current Facility-Administered Medications  Medication Dose Route Frequency Provider Last Rate Last Dose  . acetaminophen (TYLENOL) tablet 650 mg  650 mg Oral Q6H PRN Truett Mainland, DO       Or  . acetaminophen (TYLENOL) suppository 650 mg  650 mg Rectal Q6H PRN Truett Mainland, DO      . albuterol (PROVENTIL) (2.5 MG/3ML) 0.083% nebulizer solution 2.5 mg  2.5 mg Nebulization QID Stinson, Jacob J, DO   2.5 mg at 01/10/18 1138  . albuterol (PROVENTIL) (2.5 MG/3ML) 0.083% nebulizer solution 2.5 mg  2.5  mg Nebulization Q6H PRN Truett Mainland, DO      . bacitracin ointment   Topical Daily Rai, Ripudeep K, MD      . Derrill Memo ON 01/12/2018] calcitRIOL (ROCALTROL) capsule 0.25 mcg  0.25 mcg Oral Q T,Th,Sa-HD Stinson, Jacob J, DO      . calcium acetate (PHOSLO) capsule 667 mg  667 mg Oral TID WC Truett Mainland, DO      . carvedilol (COREG) tablet 25 mg  25 mg Oral BID Truett Mainland, DO   25 mg at 01/10/18 1222  . feeding supplement (NEPRO CARB STEADY) liquid 237 mL  237 mL Oral BID BM Stinson, Jacob J, DO      . fluticasone furoate-vilanterol (BREO ELLIPTA) 200-25 MCG/INH 1 puff  1 puff Inhalation Daily Bodenheimer, Charles A, NP   1 puff at 01/10/18 0731  . furosemide (LASIX) tablet 80 mg  80 mg Oral q morning - 10a Truett Mainland, DO   80 mg at 01/10/18 1223  . HYDROmorphone (DILAUDID) injection 1 mg  1 mg Intravenous Q3H PRN Truett Mainland, DO   1 mg at 01/10/18 1223  . lidocaine (XYLOCAINE) 1 % (with pres) injection           . nicotine (NICODERM CQ - dosed in mg/24 hours) patch 14 mg  14 mg Transdermal Daily Truett Mainland, DO   14 mg at 01/10/18 1229  . oxyCODONE-acetaminophen (PERCOCET/ROXICET) 5-325 MG per tablet 1 tablet  1 tablet Oral Q6H PRN Truett Mainland, DO   1 tablet at 01/10/18 0851  . pantoprazole  (PROTONIX) EC tablet 40 mg  40 mg Oral QAC supper Truett Mainland, DO       Labs: Basic Metabolic Panel: Recent Labs  Lab 01/09/18 1524 01/10/18 0600  NA 133* 136  K 4.3 4.4  CL 97* 103  CO2 26 24  GLUCOSE 111* 100*  BUN 32* 33*  CREATININE 4.63* 4.58*  CALCIUM 8.0* 7.7*   Liver Function Tests: Recent Labs  Lab 01/09/18 1524  AST 12*  ALT 8  ALKPHOS 149*  BILITOT 0.4  PROT 7.0  ALBUMIN 2.5*   No results for input(s): LIPASE, AMYLASE in the last 168 hours. No results for input(s): AMMONIA in the last 168 hours. CBC: Recent Labs  Lab 01/09/18 1524 01/10/18 0600  WBC 10.4 7.0  NEUTROABS 8.4*  --   HGB 8.8* 7.5*  HCT 27.8* 24.5*  MCV 96.2 98.4  PLT 391 277   Cardiac Enzymes: No results for input(s): CKTOTAL, CKMB, CKMBINDEX, TROPONINI in the last 168 hours. CBG: Recent Labs  Lab 01/10/18 0759 01/10/18 1137  GLUCAP 89 87   Iron Studies: No results for input(s): IRON, TIBC, TRANSFERRIN, FERRITIN in the last 72 hours. Studies/Results: Dg Chest 2 View  Result Date: 01/09/2018 CLINICAL DATA:  Short of breath EXAM: CHEST - 2 VIEW COMPARISON:  12/04/2017 FINDINGS: Right-sided central venous catheter tip over the SVC. Bilateral hyperinflation. Non inclusion of the CP angles on the lateral view. Streaky atelectasis or scarring at the bases. Stable borderline cardiomegaly. No pneumothorax. IMPRESSION: 1. Non inclusion of the lung bases on lateral view. 2. Streaky atelectasis or scarring at the bases. Electronically Signed   By: Donavan Foil M.D.   On: 01/09/2018 16:16    ROS: As per HPI otherwise negative.  Physical Exam: Vitals:   01/10/18 1100 01/10/18 1105 01/10/18 1110 01/10/18 1138  BP: 139/81 (!) 154/92 (!) 129/93   Pulse: 98 95 (!) 112  Resp:   16   Temp:      TempSrc:      SpO2: 98% 94% 97% 94%  Weight:      Height:         General: Chronically ill appearing male looks older than stated age in no acute distress. Head: Normocephalic, atraumatic,  sclera non-icteric, mucus membranes are moist Neck: Supple. JVD not elevated. Lungs: Bilateral breath sounds with scattered coarse breath sounds, slightly decreased in bases. No wheezing, no WOB.  Heart: RRR with S1 S2. No murmurs, rubs, or gallops appreciated. Abdomen: Soft, non-tender, non-distended with normoactive bowel sounds. No rebound/guarding. No obvious abdominal masses. M-S:  Strength and tone appear normal for age. Lower extremities: Bilateral 1+ LE edema.  Neuro: Alert and oriented X 3. Moves all extremities spontaneously. Psych:  Responds to questions appropriately with a normal affect. Dialysis Access: RIJ TDC Drsg intact. Ligated LUA AVF.   Dialysis Orders: (From previous adm 12/05/2017-call for records tomorrow) MWF [facility] on [address] hr 400/800 RIJ TDC Heparin 2700 units IV  Assessment/Plan: 1.  Discitis/paraspinal abscess: Per primary. ID following. No ABX yet. Went to IR for aspiration today. Cultures pending.  2.  ESRD -  MWF at [facility]. Need to get records. K+ 4.4. HD tomorrow on schedule. On heparin gtt-no heparin.  3.  Hypertension/volume  - Fair control of hypertension. On Coreg, daily furosemide. Has BLE edema. Unsure what current EDW is-need records. Attempt UFG 2.5-3 liters tomorrow.  4.  Anemia  - HGB 8.8 on adm now down to 7.5. No records available for recent ESA dosing. Recheck CBC in AM, transfuse if HGB <7.0. Get records from HD clinic.  5.  Metabolic bone disease -  Ca 7.7 C Ca 8.9 Need records from OP clinic for binder dose, VDRA. .  6.  Nutrition - Albumin 2.5 Renal Diet, nepro, renal vit.  7.  AFIB-coumadin on hold. Starting heparin gtt.  8. COPD-per primary  9. S/P evacuation of L axilla hematoma/ligation of AVF per Dr. Donzetta Matters 12/06/17. WOC consulted. Seen by [facility] 12/30/2017. Future perm access placement deferred until this wound heals.   Rita H. Owens Shark, NP-C 01/10/2018, 12:51 PM  [employer] [phone]

## 2018-01-10 NOTE — Procedures (Signed)
L L2 paraspinal abscess aspiration 2 drops bloody fluid EBL 0 Comp 0

## 2018-01-10 NOTE — Consult Note (Signed)
[facility] Nurse wound consult note Reason for Consult: Healing, full thickness wound on left upper extremity.  Paitent fell approximately 6 weeks ago and tore this LUE AVF for HD. Sister, who is at-home care provider, is present for today's assessment. Wound type: traumatic Pressure Injury POA: NA Measurement: two wounds, proximal is nearly resurfaced, distal with slight, skin level opening Wound OEV:OJJK pink, dry Drainage (amount, consistency, odor) none Periwound:intact with evidence of previous wound healing Dressing procedure/placement/frequency: Patient's sister has been performing wet-to-dry saline dampened gauze dressing changes for 6 weeks and just recently began daily neosporin dressing changes.  I will continue those except change the neosporin (triple antibiotic) ointment to bacitracin ointment as many are sensitive to the neomycin in neosporin. We will continue with daily dressing changes. Orders provided for Nursing staff.  [facility] nursing team will not follow, but will remain available to this patient, the nursing and medical teams.  Please re-consult if needed. Thanks, Maudie Flakes, MSN, RN, [place], Arther Abbott  Pager# [phone]

## 2018-01-10 NOTE — Progress Notes (Addendum)
ANTICOAGULATION CONSULT NOTE - Initial Consult  Pharmacy Consult for Heparin (while Warfarin on hold) Indication: atrial fibrillation  No Known Allergies  Patient Measurements: Height: 6\' 4"  (193 cm) Weight: 199 lb 4.8 oz (90.4 kg) IBW/kg (Calculated) : 86.8 Heparin Dosing Weight: 90 kg  Vital Signs: BP: 129/93 (09/29 1110) Pulse Rate: 112 (09/29 1110)  Labs: Recent Labs    01/09/18 1524 01/10/18 0600  HGB 8.8* 7.5*  HCT 27.8* 24.5*  PLT 391 277  LABPROT 21.1* 18.2*  INR 1.84 1.52  CREATININE 4.63* 4.58*    Estimated Creatinine Clearance: 20.8 mL/min (A) (by C-G formula based on SCr of 4.58 mg/dL (H)).   Medical History: Past Medical History:  Diagnosis Date  . Anxiety   . Arthritis    knees , back , shoulders (10/12/2017)  . Atrial fibrillation ([place])   . CAD (coronary artery disease)    Mild nonobstructive disease at cardiac catheterization 2002  . Chronic back pain    "back of my neck; down thru my legs" (10/12/2017)  . COPD (chronic obstructive pulmonary disease) ([place])   . Diverticulitis   . Esophageal reflux   . ESRD (end stage renal disease) on dialysis [facility])    "TTS; [facility]" (11/23/2017)  . Essential hypertension   . Hepatitis C    states he no longer has this  . History of kidney stones   . History of syncope   . Hyperlipidemia   . Jerking 09/23/2014  . Myocardial infarction ([place]) 02/2017   "light one" (10/12/2017)  . PAT (paroxysmal atrial tachycardia) ([place])   . Peripheral arterial disease ([place])    Occluded left superficial femoral artery status post stent June 2016 - Dr. Trula Slade  . Pneumonia 1961  . Polycystic kidney, unspecified type   . Syncope 09/2014  . SYNCOPE 05/07/2010   Qualifier: Diagnosis of  By: Laurance Flatten, RN, BSN, [place]      Assessment: 50 YOM on warfarin PTA for hx Afib - now found to have a paraspinal abscess warfarin held and reversed with Vit K 5 mg po x 1 in anticipation of future procedures. Pharmacy consulted to bridge with  Heparin.  INR today is 1.52 after reversal with Vit K. Hgb 7.5 << 8.8, plts wnl - will watch closely. Noted aspiration by IR this AM.  The patient is noted to have a recent admit in June of this year and was therapeutic on a heparin rate of 1400 units/hr (14 ml/hr) - will start at this rate.  Goal of Therapy:  Heparin level 0.3-0.7 units/ml Monitor platelets by anticoagulation protocol: Yes   Plan:  - Start Heparin at 1400 units/hr (14 ml/hr) - Will continue to monitor for any signs/symptoms of bleeding and will follow up with heparin level in 8 hours    Thank you for allowing pharmacy to be a part of this patient's care.  Alycia Rossetti, PharmD, BCPS Clinical Pharmacist Pager: [phone] Clinical phone for 01/10/2018 from 7a-3:30p: [phone] If after 3:30p, please call main pharmacy at: [phone] Please check AMION for all [place] numbers 01/10/2018 12:58 PM

## 2018-01-10 NOTE — Progress Notes (Signed)
[facility]                                                                              Patient Demographics  Aaron [name], is a 62 y.o. male, DOB - December 19, 1955, IRC:[id number]  Admit date - 01/09/2018   Admitting Physician Truett Mainland, DO  Outpatient Primary MD for the patient is [facility]  Outpatient specialists:   LOS - 1  days   Medical records reviewed and are as summarized below:    Chief Complaint  Patient presents with  . Back Pain       Brief summary   Patient is a 62 year old male with hypertension, COPD, paroxysmal A. fib on Coumadin, CAD, ESRD on HD, MWF presented with back pain.  Patient reported that he had a fall approximately 4 weeks ago with increasing midline back pain in the lumbar spine since that time.  He reported gradual diminished ability to walk, weight-bear but otherwise no bowel or bladder dysfunction.  He was evaluated by neurosurgery in [facility] who ordered an MRI which showed discitis and paraspinal abscess.   Assessment & Plan    Principal Problem:   Discitis of lumbar region, paraspinal abscess -Patient was seen in ED on 9/27 after his neurosurgeon recommended patient to go to ED.  Dr. Johnnye Sima saw the patient in ED however patient left AMA.  Patient and return to any pain ED, was transferred to [facility] for further work-up. -IR has been consulted, underwent CT-guided paraspinal abscess aspiration, await cultures -Discussed with infectious disease, Dr. Baxter Flattery will see him today, will start on antibiotics  Active Problems: ESRD on hemodialysis, MWF -Discussed with Dr. Justin Mend, dialysis per nephrology, has missed his hemodialysis on Friday  Atrial fibrillation -Currently rate controlled, status post CT-guided aspiration -Patient may need further I&D or procedures, will await starting Coumadin. -Placed on heparin drip for now  Left upper extremity wound -Appreciate wound care consult and  recommendations  CAD Per patient is not on Plavix, continue Coreg, Lasix   GERD Continue PPI  Hypertension Continue Coreg   COPD with mild exacerbation -Wheezing is improving, continue scheduled nebs   Code Status full CODE STATUS DVT Prophylaxis: Coumadin on hold, will start heparin drip Family Communication: Discussed in detail with the patient, all imaging results, lab results explained to the patient   Disposition Plan: Await further recommendations from ID  Time Spent in minutes 35 minutes  Procedures:  CT-guided aspiration  Consultants:   Interventional radiology Infectious disease  Antimicrobials:      Medications  Scheduled Meds: . albuterol  2.5 mg Nebulization QID  . bacitracin   Topical Daily  . [START ON 01/12/2018] calcitRIOL  0.25 mcg Oral Q T,Th,Sa-HD  . calcium acetate  667 mg Oral TID WC  . carvedilol  25 mg Oral BID  . feeding supplement (NEPRO CARB STEADY)  237 mL Oral BID BM  . fluticasone furoate-vilanterol  1 puff Inhalation Daily  . furosemide  80 mg Oral q morning - 10a  . lidocaine      . nicotine  14 mg Transdermal Daily  . pantoprazole  40 mg Oral  QAC supper   Continuous Infusions: PRN Meds:.acetaminophen **OR** acetaminophen, albuterol, HYDROmorphone (DILAUDID) injection, oxyCODONE-acetaminophen   Antibiotics   Anti-infectives (From admission, onward)   None        Subjective:   Aaron [name] was seen and examined today.  Complaining of low back pain, 5/10.  No fevers or chills.  Patient denies dizziness, chest pain, shortness of breath, abdominal pain, N/V/D/C, new weakness, numbess, tingling.    Objective:   Vitals:   01/10/18 1100 01/10/18 1105 01/10/18 1110 01/10/18 1138  BP: 139/81 (!) 154/92 (!) 129/93   Pulse: 98 95 (!) 112   Resp:   16   Temp:      TempSrc:      SpO2: 98% 94% 97% 94%  Weight:      Height:        Intake/Output Summary (Last 24 hours) at 01/10/2018 1219 Last data filed at 01/10/2018  0600 Gross per 24 hour  Intake 1480 ml  Output 325 ml  Net 1155 ml     Wt Readings from Last 3 Encounters:  01/09/18 90.4 kg  01/08/18 96.6 kg  12/30/17 92.8 kg     Exam  General: Alert and oriented x 3, NAD  Eyes: PERRLA, EOMI, Anicteric Sclera,  HEENT:  Atraumatic, normocephalic  Cardiovascular: S1 S2 auscultated, Regular rate and rhythm.  Respiratory: Coarse breath sounds with wheezing, improving  Gastrointestinal: Soft, nontender, nondistended, + bowel sounds  Ext: no pedal edema bilaterally  Neuro: no new deficits  Musculoskeletal: No digital cyanosis, clubbing  Skin: No rashes  Psych: Normal affect and demeanor, alert and oriented x3    Data Reviewed:  I have personally reviewed following labs and imaging studies  Micro Results Recent Results (from the past 240 hour(s))  Culture, blood (Routine X 2) w Reflex to ID Panel     Status: None (Preliminary result)   Collection Time: 01/08/18  6:55 PM  Result Value Ref Range Status   Specimen Description BLOOD RIGHT FOREARM  Final   Special Requests   Final    BOTTLES DRAWN AEROBIC AND ANAEROBIC Blood Culture results may not be optimal due to an inadequate volume of blood received in culture bottles   Culture   Final    NO GROWTH < 24 HOURS Performed at [facility], [place] [address], [place], [place] [address]    Report Status PENDING  Incomplete  Culture, blood (routine x 2)     Status: None (Preliminary result)   Collection Time: 01/09/18  3:25 PM  Result Value Ref Range Status   Specimen Description RIGHT ANTECUBITAL  Final   Special Requests   Final    BOTTLES DRAWN AEROBIC AND ANAEROBIC Blood Culture adequate volume   Culture   Final    NO GROWTH < 24 HOURS Performed at [facility], [address], [place], [place] [address]    Report Status PENDING  Incomplete  Culture, blood (routine x 2)     Status: None (Preliminary result)   Collection Time: 01/09/18  3:49 PM  Result Value Ref Range  Status   Specimen Description BLOOD RIGHT ARM  Final   Special Requests   Final    BOTTLES DRAWN AEROBIC AND ANAEROBIC Blood Culture adequate volume   Culture   Final    NO GROWTH < 24 HOURS Performed at [facility], [address] [place], [place] [address]    Report Status PENDING  Incomplete  MRSA PCR Screening     Status: None  Collection Time: 01/09/18  8:55 PM  Result Value Ref Range Status   MRSA by PCR NEGATIVE NEGATIVE Final    Comment:        The GeneXpert MRSA Assay (FDA approved for NASAL specimens only), is one component of a comprehensive MRSA colonization surveillance program. It is not intended to diagnose MRSA infection nor to guide or monitor treatment for MRSA infections. Performed at [facility], [place] [address], [place], [place] [address]     Radiology Reports Dg Chest 2 View  Result Date: 01/09/2018 CLINICAL DATA:  Short of breath EXAM: CHEST - 2 VIEW COMPARISON:  12/04/2017 FINDINGS: Right-sided central venous catheter tip over the SVC. Bilateral hyperinflation. Non inclusion of the CP angles on the lateral view. Streaky atelectasis or scarring at the bases. Stable borderline cardiomegaly. No pneumothorax. IMPRESSION: 1. Non inclusion of the lung bases on lateral view. 2. Streaky atelectasis or scarring at the bases. Electronically Signed   By: Donavan Foil M.D.   On: 01/09/2018 16:16   Ct Head Wo Contrast  Result Date: 12/25/2017 CLINICAL DATA:  Fall last night injuring back of head. Patient on Coumadin EXAM: CT HEAD WITHOUT CONTRAST TECHNIQUE: Contiguous axial images were obtained from the base of the skull through the vertex without intravenous contrast. COMPARISON:  08/24/2017 FINDINGS: Brain: Ventricles, cisterns and other CSF spaces are within normal as there is minimal age related atrophic change. There is mild chronic ischemic microvascular disease. There is no mass, mass effect, shift of midline structures or acute hemorrhage. No evidence of  acute infarction. Possible old lacune infarct over the left lentiform nucleus. Vascular: No hyperdense vessel or unexpected calcification. Skull: Normal. Negative for fracture or focal lesion. Sinuses/Orbits: Orbits are normal. There is complete opacification over the right maxillary sinus and hypoplastic frontal sinuses. Mastoid air cells demonstrate minimal opacification over the inferior left mastoid air cells. Other: None. IMPRESSION: No acute findings. Mild age related atrophic change and chronic ischemic microvascular disease. Chronic sinus inflammatory change involving the right maxillary sinus. Electronically Signed   By: Marin Olp M.D.   On: 12/25/2017 13:49    Lab Data:  CBC: Recent Labs  Lab 01/09/18 1524 01/10/18 0600  WBC 10.4 7.0  NEUTROABS 8.4*  --   HGB 8.8* 7.5*  HCT 27.8* 24.5*  MCV 96.2 98.4  PLT 391 767   Basic Metabolic Panel: Recent Labs  Lab 01/09/18 1524 01/10/18 0600  NA 133* 136  K 4.3 4.4  CL 97* 103  CO2 26 24  GLUCOSE 111* 100*  BUN 32* 33*  CREATININE 4.63* 4.58*  CALCIUM 8.0* 7.7*   GFR: Estimated Creatinine Clearance: 20.8 mL/min (A) (by C-G formula based on SCr of 4.58 mg/dL (H)). Liver Function Tests: Recent Labs  Lab 01/09/18 1524  AST 12*  ALT 8  ALKPHOS 149*  BILITOT 0.4  PROT 7.0  ALBUMIN 2.5*   No results for input(s): LIPASE, AMYLASE in the last 168 hours. No results for input(s): AMMONIA in the last 168 hours. Coagulation Profile: Recent Labs  Lab 01/06/18 01/09/18 1524 01/10/18 0600  INR 1.8* 1.84 1.52   Cardiac Enzymes: No results for input(s): CKTOTAL, CKMB, CKMBINDEX, TROPONINI in the last 168 hours. BNP (last 3 results) No results for input(s): PROBNP in the last 8760 hours. HbA1C: No results for input(s): HGBA1C in the last 72 hours. CBG: Recent Labs  Lab 01/10/18 0759 01/10/18 1137  GLUCAP 89 87   Lipid Profile: No results for input(s): CHOL, HDL, LDLCALC, TRIG,  CHOLHDL, LDLDIRECT in the last 72  hours. Thyroid Function Tests: No results for input(s): TSH, T4TOTAL, FREET4, T3FREE, THYROIDAB in the last 72 hours. Anemia Panel: No results for input(s): VITAMINB12, FOLATE, FERRITIN, TIBC, IRON, RETICCTPCT in the last 72 hours. Urine analysis:    Component Value Date/Time   COLORURINE YELLOW 09/19/2017 0701   APPEARANCEUR CLEAR 09/19/2017 0701   LABSPEC 1.008 09/19/2017 0701   PHURINE 6.0 09/19/2017 0701   GLUCOSEU NEGATIVE 09/19/2017 0701   HGBUR SMALL (A) 09/19/2017 0701   BILIRUBINUR NEGATIVE 09/19/2017 0701   KETONESUR NEGATIVE 09/19/2017 0701   PROTEINUR 30 (A) 09/19/2017 0701   UROBILINOGEN 0.2 03/03/2010 1056   NITRITE NEGATIVE 09/19/2017 0701   LEUKOCYTESUR NEGATIVE 09/19/2017 0701     Ripudeep Rai M.D. [facility] 01/10/2018, 12:19 PM  Pager: [phone] Between 7am to 7pm - call Pager - [phone]  After 7pm go to [email or web address] - password [email or web address]  Call night coverage person covering after 7pm

## 2018-01-10 NOTE — Progress Notes (Signed)
[place] for Heparin (while Warfarin on hold) Indication: atrial fibrillation  No Known Allergies  Patient Measurements: Height: 6\' 4"  (193 cm) Weight: 199 lb 4.8 oz (90.4 kg) IBW/kg (Calculated) : 86.8 Heparin Dosing Weight: 90 kg  Vital Signs: Temp: 98.1 F (36.7 C) (09/29 2202) Temp Source: Oral (09/29 2202) BP: 127/93 (09/29 2202) Pulse Rate: 114 (09/29 2202)  Labs: Recent Labs    01/09/18 1524 01/10/18 0600 01/10/18 2145  HGB 8.8* 7.5*  --   HCT 27.8* 24.5*  --   PLT 391 277  --   LABPROT 21.1* 18.2*  --   INR 1.84 1.52  --   HEPARINUNFRC  --   --  0.15*  CREATININE 4.63* 4.58*  --     Estimated Creatinine Clearance: 20.8 mL/min (A) (by C-G formula based on SCr of 4.58 mg/dL (H)).   Medical History: Past Medical History:  Diagnosis Date  . Anxiety   . Arthritis    knees , back , shoulders (10/12/2017)  . Atrial fibrillation ([place])   . CAD (coronary artery disease)    Mild nonobstructive disease at cardiac catheterization 2002  . Chronic back pain    "back of my neck; down thru my legs" (10/12/2017)  . COPD (chronic obstructive pulmonary disease) ([place])   . Diverticulitis   . Esophageal reflux   . ESRD (end stage renal disease) on dialysis [facility])    "TTS; [place]" (11/23/2017)  . Essential hypertension   . Hepatitis C    states he no longer has this  . History of kidney stones   . History of syncope   . Hyperlipidemia   . Jerking 09/23/2014  . Myocardial infarction ([place]) 02/2017   "light one" (10/12/2017)  . PAT (paroxysmal atrial tachycardia) ([place])   . Peripheral arterial disease ([place])    Occluded left superficial femoral artery status post stent June 2016 - Dr. Trula Slade  . Pneumonia 1961  . Polycystic kidney, unspecified type   . Syncope 09/2014  . SYNCOPE 05/07/2010   Qualifier: Diagnosis of  By: Laurance Flatten, RN, BSN, Jennifer      Assessment: 20 YOM on warfarin PTA for hx Afib - now found to have a paraspinal abscess  warfarin held and reversed with Vit K 5 mg po x 1 in anticipation of future procedures. Pharmacy consulted to bridge with Heparin.  INR today is 1.52 after reversal with Vit K. Hgb 7.5 << 8.8, plts wnl - will watch closely. Noted aspiration by IR this AM.  The patient is noted to have a recent admit in June of this year and was therapeutic on a heparin rate of 1400 units/hr (14 ml/hr) - will start at this rate.  9/29 PM update: heparin level low tonight  Goal of Therapy:  Heparin level 0.3-0.7 units/ml Monitor platelets by anticoagulation protocol: Yes   Plan:  -Inc heparin to 1600 units/hr -Heparin level with AM labs  Narda Bonds, PharmD, [place] Pharmacist Phone: [phone]

## 2018-01-10 NOTE — Progress Notes (Addendum)
[place] for Infectious Disease    Date of Admission:  01/09/2018     ID: Aaron [name] is a 62 y.o. male with new 2 level discitis Principal Problem:   Discitis of lumbar region Active Problems:   Essential hypertension   Esophageal reflux   Tobacco abuse   COPD (chronic obstructive pulmonary disease) (H[facility])   CAD (coronary artery disease)   AF (paroxysmal atrial fibrillation) ([facility])   ESRD on dialysis ([place])   Paraspinal abscess ([facility])    Subjective: Had disc aspirate today, significant back pain  Medications:  . albuterol  2.5 mg Nebulization QID  . bacitracin   Topical Daily  . [START ON 01/12/2018] calcitRIOL  0.25 mcg Oral Q T,Th,Sa-HD  . calcium acetate  667 mg Oral TID WC  . carvedilol  25 mg Oral BID  . [START ON 01/11/2018] Chlorhexidine Gluconate Cloth  6 each Topical Q0600  . feeding supplement (NEPRO CARB STEADY)  237 mL Oral BID BM  . fluticasone furoate-vilanterol  1 puff Inhalation Daily  . furosemide  80 mg Oral q morning - 10a  . lidocaine      . nicotine  14 mg Transdermal Daily  . pantoprazole  40 mg Oral QAC supper    Objective: Vital signs in last 24 hours: Temp:  [98.1 F (36.7 C)] 98.1 F (36.7 C) (09/28 2042) Pulse Rate:  [95-115] 112 (09/29 1110) Resp:  [12-20] 16 (09/29 1110) BP: (129-154)/(81-108) 129/93 (09/29 1110) SpO2:  [94 %-100 %] 94 % (09/29 1138) Weight:  [90.4 kg] 90.4 kg (09/28 2039) Physical Exam  Constitutional: He is oriented to person, place, and time. He appears well-developed and well-nourished. No distress.  HENT:  Mouth/Throat: Oropharynx is clear and moist. No oropharyngeal exudate.  Cardiovascular: Normal rate, regular rhythm and normal heart sounds. Exam reveals no gallop and no friction rub.  No murmur heard.  Chest wall = right hd catheter in place Pulmonary/Chest: Effort normal and breath sounds normal. No respiratory distress. He has no wheezes.  Abdominal: Soft. Bowel sounds are normal. He exhibits no  distension. There is no tenderness.  Lymphadenopathy:  He has no cervical adenopathy.  Ext: healed incision on LUE Neurological: He is alert and oriented to person, place, and time.  Skin: Skin is warm and dry. No rash noted. No erythema.  Psychiatric: He has a normal mood and affect. His behavior is normal.       Lab Results Recent Labs    01/09/18 1524 01/10/18 0600  WBC 10.4 7.0  HGB 8.8* 7.5*  HCT 27.8* 24.5*  NA 133* 136  K 4.3 4.4  CL 97* 103  CO2 26 24  BUN 32* 33*  CREATININE 4.63* 4.58*   Liver Panel Recent Labs    01/09/18 1524  PROT 7.0  ALBUMIN 2.5*  AST 12*  ALT 8  ALKPHOS 149*  BILITOT 0.4   No results found for: ESRSEDRATE, POCTSEDRATE  Microbiology: pending Studies/Results: Dg Chest 2 View  Result Date: 01/09/2018 CLINICAL DATA:  Short of breath EXAM: CHEST - 2 VIEW COMPARISON:  12/04/2017 FINDINGS: Right-sided central venous catheter tip over the SVC. Bilateral hyperinflation. Non inclusion of the CP angles on the lateral view. Streaky atelectasis or scarring at the bases. Stable borderline cardiomegaly. No pneumothorax. IMPRESSION: 1. Non inclusion of the lung bases on lateral view. 2. Streaky atelectasis or scarring at the bases. Electronically Signed   By: Donavan Foil M.D.   On: 01/09/2018 16:16  Assessment/Plan: Lumbar discitis with paraspinal abscess s/p Aspiration on 9/29. Hx of serratia nad e.faecalis infected AV graft that was I x D with ligation of basilic vein AV fistula on 8/25 with secondary serratia bacteremia. Was discharged on 3 wk of ceftaz which would have ended mid September. But now has had progressive back pain, weakness requiring to walk with assist device/walker.   - please start vancomycin and ceftaz. Anticipate to give with HD x 8 wk - await sensitivity of recent aspirate - please get TEE to see if any evidence of endocarditis - needs management of back pain as well. - will check sed rate and crp tomorrow  [address] [facility] for Infectious Diseases Cell: [phone] Pager: [phone]  01/10/2018, 2:03 PM

## 2018-01-10 NOTE — Progress Notes (Signed)
Pharmacy Antibiotic Note  Aaron [name] is a 62 y.o. male admitted on 01/09/2018 with paraspinal abscess now s/p IR aspiration today. Pharmacy has been consulted for Vancomycin and Ceftazidime dosing.   ESRD-MWF, ID planning 8 weeks of antibiotics with HD. Will follow-up culture results to see if options are available for narrowing.  Plan: - Ceftazidime 1g IV x 1 - Ceftazidime 2g post HD on MWF @ 2000 (starting on 9/30) - Vancomycin 2g IV x 1  - Vancomycin 1g on MWF-HD (starting on 9/30) - Will continue to follow HD schedule/duration, culture results, LOT, and antibiotic de-escalation plans    Height: 6\' 4"  (193 cm) Weight: 199 lb 4.8 oz (90.4 kg) IBW/kg (Calculated) : 86.8  Temp (24hrs), Avg:98.1 F (36.7 C), Min:98.1 F (36.7 C), Max:98.1 F (36.7 C)  Recent Labs  Lab 01/09/18 1524 01/10/18 0600  WBC 10.4 7.0  CREATININE 4.63* 4.58*    Estimated Creatinine Clearance: 20.8 mL/min (A) (by C-G formula based on SCr of 4.58 mg/dL (H)).    No Known Allergies  Antimicrobials this admission: Vanc 9/29 >> Ceftazidime 9/29 >>  Dose adjustments this admission: n/a  Microbiology results: 9/28 MRSA PCR >> neg 9/29 BCx >> 9/29 IR aspirate of abscess >>  Thank you for allowing pharmacy to be a part of this patient's care.  Alycia Rossetti, PharmD, BCPS Clinical Pharmacist Pager: [phone] Clinical phone for 01/10/2018 from 7a-3:30p: [phone] If after 3:30p, please call main pharmacy at: [phone] Please check AMION for all [place] numbers 01/10/2018 2:08 PM

## 2018-01-10 NOTE — Consult Note (Signed)
Chief Complaint: Patient was seen in consultation today for back pain  Referring Physician(s): Dr. Tana Coast  Supervising Physician: Marybelle Killings  Patient Status: [facility] - In-pt  History of Present Illness: Aaron [name] is a 62 y.o. male with past medical history of HTN, COPD, afib on chronic anticoagulation at home, CAD s/p MI in July 2019 who presented to [facility] with back pain.  Patient was assessed for same 01/08/18 and was planning for admission with evaluation for paraspinal abscess, however left AMA before work-up completed.  He returns to [facility] overnight for ongoing back pain.    ID has been consulted.  IR consulted for aspiration and drainage of discitis vs. Paraspinal abscess.   Case reviewed and approved by Dr. Barbie Banner for paraspinal abscess aspiration for culture.   Past Medical History:  Diagnosis Date  . Anxiety   . Arthritis    knees , back , shoulders (10/12/2017)  . Atrial fibrillation ([place])   . CAD (coronary artery disease)    Mild nonobstructive disease at cardiac catheterization 2002  . Chronic back pain    "back of my neck; down thru my legs" (10/12/2017)  . COPD (chronic obstructive pulmonary disease) ([place])   . Diverticulitis   . Esophageal reflux   . ESRD (end stage renal disease) on dialysis [facility])    "TTS; [place]" (11/23/2017)  . Essential hypertension   . Hepatitis C    states he no longer has this  . History of kidney stones   . History of syncope   . Hyperlipidemia   . Jerking 09/23/2014  . Myocardial infarction ([place]) 02/2017   "light one" (10/12/2017)  . PAT (paroxysmal atrial tachycardia) ([place])   . Peripheral arterial disease ([place])    Occluded left superficial femoral artery status post stent June 2016 - Dr. Trula Slade  . Pneumonia 1961  . Polycystic kidney, unspecified type   . Syncope 09/2014  . SYNCOPE 05/07/2010   Qualifier: Diagnosis of  By: Laurance Flatten RN, BSN, Anderson Malta      Past Surgical History:  Procedure Laterality Date  . ABDOMINAL AORTOGRAM N/A 08/11/2017     Procedure: ABDOMINAL AORTOGRAM;  Surgeon: Serafina Mitchell, MD;  Location: [place] CV LAB;  Service: Cardiovascular;  Laterality: N/A;  . ANTERIOR CERVICAL DECOMP/DISCECTOMY FUSION    . APPLICATION OF WOUND VAC Right 08/14/2017   Procedure: APPLICATION OF PREVENA INCISIONAL WOUND VAC RIGHT GROIN;  Surgeon: Serafina Mitchell, MD;  Location: [facility] OR;  Service: Vascular;  Laterality: Right;  . AV FISTULA PLACEMENT Left 09/04/2017   Procedure: ARTERIOVENOUS (AV) FISTULA CREATION LEFT UPPER ARM;  Surgeon: Serafina Mitchell, MD;  Location: [place];  Service: Vascular;  Laterality: Left;  . BACK SURGERY    . BASCILIC VEIN TRANSPOSITION Left 11/20/2017   Procedure: SECOND STAGE BASILIC VEIN TRANSPOSITION LEFT ARM;  Surgeon: Serafina Mitchell, MD;  Location: [place];  Service: Vascular;  Laterality: Left;  . BIOPSY  12/17/2016   Procedure: BIOPSY;  Surgeon: Daneil Dolin, MD;  [facility];  Service: Gastroenterology;;  gastric colon  . BIOPSY  04/29/2017   Procedure: BIOPSY;  Surgeon: Daneil Dolin, MD;  [facility];  Service: Endoscopy;;  duodenal biopsies  . BUNIONECTOMY Bilateral   . COLONOSCOPY  2008   Dr. Oneida Alar: rare sigmoid colon diverticulosis, internal hemorrhoids.   . COLONOSCOPY WITH PROPOFOL N/A 12/17/2016   dense left-sided diverticulosis, right colon ulcers s/p biopsy query occult NSAID use vs transient ischemia, not consistent with IBD.  CMV stains negative.   Marland Kitchen ENDARTERECTOMY FEMORAL Right 08/14/2017   Procedure: RIGHT ILLIO-FEMORAL ENDARTERECTOMY;  Surgeon: Serafina Mitchell, MD;  Location: [facility] OR;  Service: Vascular;  Laterality: Right;  . ESOPHAGEAL DILATION  12/17/2016   EGD with mild Schatzki's ring s/p dilatation, small hiatal hernia, erosive gastropathy (negative H.pylori gastritis)  . ESOPHAGOGASTRODUODENOSCOPY  2008   Dr. Oneida Alar: normal esophagus without Barrett's, antritis and duodenitis, path with H.pylori gastritis  . ESOPHAGOGASTRODUODENOSCOPY (EGD) WITH PROPOFOL  N/A 12/17/2016   Procedure: ESOPHAGOGASTRODUODENOSCOPY (EGD) WITH PROPOFOL;  Surgeon: Daneil Dolin, MD;  [facility];  Service: Gastroenterology;  Laterality: N/A;  . ESOPHAGOGASTRODUODENOSCOPY (EGD) WITH PROPOFOL N/A 04/29/2017   Patchy erythema of gastric mucosa diffusely, extensive inflammatory changes in duodenum, geographic ulceration and mucosal edema present, encroaching somewhat on the lumen yet still widely patent, distal second portion of duodenum appeared abnormal, path with peptic duodenitis with ulceration  . GROIN DEBRIDEMENT Right 10/14/2017   Procedure: RIGHT GROIN AND RIGHT LOWER QUADRANT ABDOMEN DEBRIDEMENT WITH PLACEMENT OF ANTIBIOTIC BEADS;  Surgeon: Serafina Mitchell, MD;  Location: [place];  Service: Vascular;  Laterality: Right;  . HEMATOMA EVACUATION Left 12/06/2017   Procedure: EVACUATION HEMATOMA;  Surgeon: Waynetta Sandy, MD;  Location: [place];  Service: Vascular;  Laterality: Left;  . HERNIA REPAIR  4401   umbilical  . I&D EXTREMITY Right 09/21/2017   Procedure: IRRIGATION AND DEBRIDEMENT GROIN;  Surgeon: Elam Dutch, MD;  Location: [facility] OR;  Service: Vascular;  Laterality: Right;  . I&D EXTREMITY Left 11/23/2017   Procedure: Evacuation Hematoma LEFT UPPER ARM GRAFT;  Surgeon: Elam Dutch, MD;  Location: [place];  Service: Vascular;  Laterality: Left;  . INGUINAL HERNIA REPAIR Right 02/2017   [place]  . INSERTION OF DIALYSIS CATHETER Right 09/04/2017   Procedure: INSERTION OF TUNNELED  DIALYSIS CATHETER - RIGHT INTERNAL JUGULAR PLACEMENT;  Surgeon: Serafina Mitchell, MD;  Location: [place];  Service: Vascular;  Laterality: Right;  . INSERTION OF ILIAC STENT Right 08/14/2017   Procedure: INSERTION OF RIGHT COMMON ILIAC STENT 32mm x 84mm x 130cm INSERTION OF RIGHT EXTERNAL ILIAC STENT 60mm x 45mm x 130cm INSERTION OF SUPERFICIAL FERMORAL ARTERY STENT 31mm x 80mm x 130cm;  Surgeon: Serafina Mitchell, MD;  Location: [facility] OR;  Service: Vascular;  Laterality:  Right;  . IR FLUORO GUIDE CV LINE RIGHT  08/31/2017  . IR US GUIDE VASC ACCESS RIGHT  08/31/2017  . LOWER EXTREMITY ANGIOGRAPHY Right 08/11/2017   Procedure: LOWER EXTREMITY ANGIOGRAPHY;  Surgeon: Serafina Mitchell, MD;  Location: [place] CV LAB;  Service: Cardiovascular;  Laterality: Right;  . PATCH ANGIOPLASTY Right 08/14/2017   Procedure: PATCH ANGIOPLASTY USING HEMASHIELD PATCH 0.3IN Lillie Columbia;  Surgeon: Serafina Mitchell, MD;  [facility];  Service: Vascular;  Laterality: Right;  . PERIPHERAL VASCULAR CATHETERIZATION N/A 09/20/2014   Procedure: Abdominal Aortogram;  Surgeon: Serafina Mitchell, MD;  Location: [place] CV LAB;  Service: Cardiovascular;  Laterality: N/A;  . POSTERIOR FUSION LUMBAR SPINE      Allergies: Patient has no known allergies.  Medications: Prior to Admission medications   Medication Sig Start Date End Date Taking? Authorizing Provider  B Complex-C-Folic Acid (DIALYVITE 027) 0.8 MG TABS Take 1 tablet by mouth daily. 09/15/17  Yes [provider]  BREO ELLIPTA 200-25 MCG/INH AEPB Inhale 1 puff into the lungs every evening.  07/27/17  Yes [provider]  calcitRIOL (ROCALTROL) 0.25 MCG capsule Take 1 capsule (0.25  mcg total) by mouth Every Tuesday,Thursday,and Saturday with dialysis. 09/08/17  Yes Ghimire, Henreitta Leber, MD  calcium acetate (PHOSLO) 667 MG capsule Take 667 mg by mouth at bedtime. Prescribed three times daily with meals 09/15/17  Yes [provider]  carvedilol (COREG) 25 MG tablet Take 1 tablet (25 mg total) 2 (two) times daily by mouth. Patient taking differently: Take 25 mg by mouth at bedtime. Prescribed one tablet twice daily 02/25/17 01/09/18 Yes Imogene Burn, PA-C  cholecalciferol (VITAMIN D) 1000 units tablet Take 2,000 Units by mouth daily.   Yes [provider]  clopidogrel (PLAVIX) 75 MG tablet Take 75 mg by mouth daily.  12/24/17  Yes [provider]  furosemide (LASIX) 40 MG tablet Take 40 mg by mouth  every morning.  12/23/17  Yes [provider]  oxyCODONE-acetaminophen (PERCOCET) 10-325 MG tablet Take 1 tablet by mouth every 6 (six) hours as needed for pain.   Yes [provider]  pantoprazole (PROTONIX) 40 MG tablet Take 1 tablet (40 mg total) by mouth 2 (two) times daily. 04/30/17 01/09/18 Yes Shah, Pratik D, DO  metoprolol succinate (TOPROL-XL) 25 MG 24 hr tablet Take 25 mg by mouth daily.  09/24/14   [provider]  Nutritional Supplements (FEEDING SUPPLEMENT, NEPRO CARB STEADY,) LIQD Take 237 mLs by mouth 2 (two) times daily between meals. 10/21/17   Rhyne, Hulen Shouts, PA-C  warfarin (COUMADIN) 5 MG tablet Take 1 tablet (5 mg total) by mouth daily. Patient not taking: Reported on 01/09/2018 10/21/17 10/21/18  Gabriel Earing, PA-C     Family History  Problem Relation Age of Onset  . Alcoholism Mother   . Heart disease Father        Massive heart attack  . Heart attack Father   . Atrial fibrillation Father   . Colon cancer Father   . Colon cancer Maternal Grandfather 29  . Alcoholism Maternal Grandfather   . Renal cancer Cousin   . Ovarian cancer Sister     Social History   Socioeconomic History  . Marital status: Legally Separated    Spouse name: Not on file  . Number of children: 2  . Years of education: Not on file  . Highest education level: Not on file  Occupational History  . Occupation: DISABLED  Social Needs  . Financial resource strain: Not on file  . Food insecurity:    Worry: Not on file    Inability: Not on file  . Transportation needs:    Medical: No    Non-medical: No  Tobacco Use  . Smoking status: Current Every Day Smoker    Packs/day: 2.00    Years: 47.00    Pack years: 94.00    Types: Cigarettes    Last attempt to quit: 08/03/2017    Years since quitting: 0.4  . Smokeless tobacco: Never Used  Substance and Sexual Activity  . Alcohol use: Not Currently    Comment: 10/12/2017 alcohol free since 2017,  heavy drinker in the  past  . Drug use: Not Currently    Comment: "<2003 whatever was around; nothing since"  . Sexual activity: Not Currently  Lifestyle  . Physical activity:    Days per week: Not on file    Minutes per session: Not on file  . Stress: Not on file  Relationships  . Social connections:    Talks on phone: Not on file    Gets together: Not on file    Attends religious service: Not  on file    Active member of club or organization: Not on file    Attends meetings of clubs or organizations: Not on file    Relationship status: Not on file  Other Topics Concern  . Not on file  Social History Narrative   Disabled from Back since 2007     Review of Systems: A 12 point ROS discussed and pertinent positives are indicated in the HPI above.  All other systems are negative.  Review of Systems  Constitutional: Negative for fatigue and fever.  Respiratory: Positive for cough and shortness of breath.   Cardiovascular: Negative for chest pain.  Gastrointestinal: Negative for abdominal pain.  Genitourinary: Negative for difficulty urinating and dysuria.  Musculoskeletal: Positive for back pain.  Psychiatric/Behavioral: Negative for behavioral problems and confusion.    Vital Signs: BP (!) 147/91 (BP Location: Right Arm)   Pulse (!) 112   Temp 98.1 F (36.7 C) (Oral)   Resp 18   Ht 6\' 4"  (1.93 m)   Wt 199 lb 4.8 oz (90.4 kg)   SpO2 98%   BMI 24.26 kg/m   Physical Exam  Constitutional: He is oriented to person, place, and time. He appears well-developed. No distress.  Cardiovascular: Normal rate, regular rhythm and normal heart sounds.  Pulmonary/Chest: Effort normal. No respiratory distress. He has wheezes.  Coarse breath sounds, audible wheezes  Musculoskeletal: He exhibits tenderness.  Neurological: He is alert and oriented to person, place, and time.  Skin: Skin is warm and dry. He is not diaphoretic.  Psychiatric: He has a normal mood and affect. His behavior is normal. Judgment and  thought content normal.  Nursing note and vitals reviewed.    MD Evaluation Airway: WNL Heart: WNL Abdomen: WNL Chest/ Lungs: WNL ASA  Classification: 2 Mallampati/Airway Score: Two   Imaging: Dg Chest 2 View  Result Date: 01/09/2018 CLINICAL DATA:  Short of breath EXAM: CHEST - 2 VIEW COMPARISON:  12/04/2017 FINDINGS: Right-sided central venous catheter tip over the SVC. Bilateral hyperinflation. Non inclusion of the CP angles on the lateral view. Streaky atelectasis or scarring at the bases. Stable borderline cardiomegaly. No pneumothorax. IMPRESSION: 1. Non inclusion of the lung bases on lateral view. 2. Streaky atelectasis or scarring at the bases. Electronically Signed   By: Donavan Foil M.D.   On: 01/09/2018 16:16   Ct Head Wo Contrast  Result Date: 12/25/2017 CLINICAL DATA:  Fall last night injuring back of head. Patient on Coumadin EXAM: CT HEAD WITHOUT CONTRAST TECHNIQUE: Contiguous axial images were obtained from the base of the skull through the vertex without intravenous contrast. COMPARISON:  08/24/2017 FINDINGS: Brain: Ventricles, cisterns and other CSF spaces are within normal as there is minimal age related atrophic change. There is mild chronic ischemic microvascular disease. There is no mass, mass effect, shift of midline structures or acute hemorrhage. No evidence of acute infarction. Possible old lacune infarct over the left lentiform nucleus. Vascular: No hyperdense vessel or unexpected calcification. Skull: Normal. Negative for fracture or focal lesion. Sinuses/Orbits: Orbits are normal. There is complete opacification over the right maxillary sinus and hypoplastic frontal sinuses. Mastoid air cells demonstrate minimal opacification over the inferior left mastoid air cells. Other: None. IMPRESSION: No acute findings. Mild age related atrophic change and chronic ischemic microvascular disease. Chronic sinus inflammatory change involving the right maxillary sinus.  Electronically Signed   By: Marin Olp M.D.   On: 12/25/2017 13:49    Labs:  CBC: Recent Labs    12/07/17 0534  12/08/17 0456 01/09/18 1524 01/10/18 0600  WBC 7.4 12.5* 10.4 7.0  HGB 7.2* 10.3* 8.8* 7.5*  HCT 21.9* 31.3* 27.8* 24.5*  PLT 68* 79* 391 277    COAGS: Recent Labs    08/05/17 1646  08/14/17 0845  12/28/17 01/06/18 01/09/18 1524 01/10/18 0600  INR 2.56   < > 1.05   < > 2.7 1.8* 1.84 1.52  APTT 43*  --  32  --   --   --   --   --    < > = values in this interval not displayed.    BMP: Recent Labs    12/07/17 0534 12/08/17 0456 01/09/18 1524 01/10/18 0600  NA 129* 135 133* 136  K 4.0 4.2 4.3 4.4  CL 92* 98 97* 103  CO2 28 27 26 24   GLUCOSE 140* 103* 111* 100*  BUN 30* 21 32* 33*  CALCIUM 7.2* 7.6* 8.0* 7.7*  CREATININE 3.46* 2.55* 4.63* 4.58*  GFRNONAA 18* 26* 12* 13*  GFRAA 20* 30* 14* 15*    LIVER FUNCTION TESTS: Recent Labs    10/10/17 0551  10/20/17 0725 12/04/17 2205 12/05/17 0503 01/09/18 1524  BILITOT 0.5  --   --  0.6 0.5 0.4  AST 22  --   --  46* 42* 12*  ALT 11  --   --  10 12 8   ALKPHOS 93  --   --  86 89 149*  PROT 6.2*  --   --  5.0* 5.2* 7.0  ALBUMIN 2.5*   < > 2.5* 2.2* 2.2* 2.5*   < > = values in this interval not displayed.    TUMOR MARKERS: No results for input(s): AFPTM, CEA, CA199, CHROMGRNA in the last 8760 hours.  Assessment and Plan: Back pain Patient  Underwent MR in [place] which showed paraspinal abscess and discitis.  IR consulted for aspiration and the request of Dr. Tana Coast.  ID has been consulted.  On coumadin at home, but reports he hasn't taken in 2-3 days due to "bouncing around to different places trying to get some relief."  Reports he no longer takes Plavix.  INR 1.52 today. He did receive Vit K overnight.  Case reviewed and approved by Dr. Barbie Banner.   Risks and benefits discussed with the patient including bleeding, infection, damage to adjacent structures, sepsis, paralysis.  All of the patient's  questions were answered, patient is agreeable to proceed. Consent signed and in chart.  Thank you for this interesting consult.  I greatly enjoyed meeting RODDERICK HOLTZER and look forward to participating in their care.  A copy of this report was sent to the requesting provider on this date.  Electronically Signed: Docia Barrier, PA 01/10/2018, 10:02 AM   I spent a total of 40 Minutes    in face to face in clinical consultation, greater than 50% of which was counseling/coordinating care for paraspinal abscess.

## 2018-01-11 LAB — CBC
HCT: 25.7 % — ABNORMAL LOW (ref 39.0–52.0)
HEMATOCRIT: 25.4 % — AB (ref 39.0–52.0)
HEMOGLOBIN: 7.6 g/dL — AB (ref 13.0–17.0)
Hemoglobin: 7.7 g/dL — ABNORMAL LOW (ref 13.0–17.0)
MCH: 29.9 pg (ref 26.0–34.0)
MCH: 30.2 pg (ref 26.0–34.0)
MCHC: 29.9 g/dL — AB (ref 30.0–36.0)
MCHC: 30 g/dL (ref 30.0–36.0)
MCV: 100 fL (ref 78.0–100.0)
MCV: 100.8 fL — ABNORMAL HIGH (ref 78.0–100.0)
Platelets: 264 10*3/uL (ref 150–400)
Platelets: 241 K/uL (ref 150–400)
RBC: 2.54 MIL/uL — AB (ref 4.22–5.81)
RBC: 2.55 MIL/uL — ABNORMAL LOW (ref 4.22–5.81)
RDW: 19.5 % — ABNORMAL HIGH (ref 11.5–15.5)
RDW: 19.4 % — ABNORMAL HIGH (ref 11.5–15.5)
WBC: 7.6 10*3/uL (ref 4.0–10.5)
WBC: 4.9 K/uL (ref 4.0–10.5)

## 2018-01-11 LAB — RENAL FUNCTION PANEL
Albumin: 2.2 g/dL — ABNORMAL LOW (ref 3.5–5.0)
Anion gap: 6 (ref 5–15)
BUN: 28 mg/dL — ABNORMAL HIGH (ref 8–23)
CO2: 25 mmol/L (ref 22–32)
Calcium: 7.8 mg/dL — ABNORMAL LOW (ref 8.9–10.3)
Chloride: 104 mmol/L (ref 98–111)
Creatinine, Ser: 3.94 mg/dL — ABNORMAL HIGH (ref 0.61–1.24)
GFR calc Af Amer: 18 mL/min — ABNORMAL LOW (ref >60)
GFR calc non Af Amer: 15 mL/min — ABNORMAL LOW (ref >60)
Glucose, Bld: 100 mg/dL — ABNORMAL HIGH (ref 70–99)
Phosphorus: 4.1 mg/dL (ref 2.5–4.6)
Potassium: 4.3 mmol/L (ref 3.5–5.1)
Sodium: 135 mmol/L (ref 135–145)

## 2018-01-11 LAB — BASIC METABOLIC PANEL
ANION GAP: 11 (ref 5–15)
BUN: 35 mg/dL — ABNORMAL HIGH (ref 8–23)
CHLORIDE: 105 mmol/L (ref 98–111)
CO2: 22 mmol/L (ref 22–32)
CREATININE: 4.78 mg/dL — AB (ref 0.61–1.24)
Calcium: 7.9 mg/dL — ABNORMAL LOW (ref 8.9–10.3)
GFR calc non Af Amer: 12 mL/min — ABNORMAL LOW (ref >60)
GFR, EST AFRICAN AMERICAN: 14 mL/min — AB (ref >60)
Glucose, Bld: 101 mg/dL — ABNORMAL HIGH (ref 70–99)
POTASSIUM: 4.7 mmol/L (ref 3.5–5.1)
Sodium: 138 mmol/L (ref 135–145)

## 2018-01-11 LAB — HEPARIN LEVEL (UNFRACTIONATED)
HEPARIN UNFRACTIONATED: 0.46 [IU]/mL (ref 0.30–0.70)
Heparin Unfractionated: 0.18 IU/mL — ABNORMAL LOW (ref 0.30–0.70)

## 2018-01-11 LAB — PROTIME-INR
INR: 1.25
Prothrombin Time: 15.6 seconds — ABNORMAL HIGH (ref 11.4–15.2)

## 2018-01-11 LAB — C-REACTIVE PROTEIN: CRP: 6.2 mg/dL — AB (ref <1.0)

## 2018-01-11 MED ORDER — PENTAFLUOROPROP-TETRAFLUOROETH EX AERO: 1.0000 "application " | INHALATION_SPRAY | CUTANEOUS | Status: DC | PRN

## 2018-01-11 MED ORDER — HYDROMORPHONE HCL 1 MG/ML IJ SOLN
INTRAMUSCULAR | Status: AC
  Administered 2018-01-11: 1 mg
  Filled 2018-01-11: qty 1

## 2018-01-11 MED ORDER — SODIUM CHLORIDE 0.9 % IV SOLN: 100.0000 mL | INTRAVENOUS | Status: DC | PRN

## 2018-01-11 MED ORDER — DARBEPOETIN ALFA 100 MCG/0.5ML IJ SOSY: 100.0000 ug | PREFILLED_SYRINGE | INTRAMUSCULAR | Status: DC

## 2018-01-11 MED ORDER — HEPARIN SODIUM (PORCINE) 1000 UNIT/ML IJ SOLN
INTRAMUSCULAR | Status: AC
  Administered 2018-01-11: 1000 [IU] via INTRAVENOUS_CENTRAL
  Filled 2018-01-11: qty 4

## 2018-01-11 MED ORDER — VANCOMYCIN HCL IN DEXTROSE 1-5 GM/200ML-% IV SOLN
INTRAVENOUS | Status: AC
  Administered 2018-01-11: 1000 mg via INTRAVENOUS
  Filled 2018-01-11: qty 200

## 2018-01-11 MED ORDER — HEPARIN SODIUM (PORCINE) 1000 UNIT/ML DIALYSIS
1000.0000 [IU] | INTRAMUSCULAR | Status: DC | PRN
  Administered 2018-01-11: 1000 [IU] via INTRAVENOUS_CENTRAL

## 2018-01-11 MED ORDER — OXYCODONE-ACETAMINOPHEN 5-325 MG PO TABS
ORAL_TABLET | ORAL | Status: AC
  Filled 2018-01-11: qty 1

## 2018-01-11 MED ORDER — LIDOCAINE-PRILOCAINE 2.5-2.5 % EX CREA: 1.0000 "application " | TOPICAL_CREAM | CUTANEOUS | Status: DC | PRN

## 2018-01-11 MED ORDER — LIDOCAINE HCL (PF) 1 % IJ SOLN: 5.0000 mL | INTRAMUSCULAR | Status: DC | PRN

## 2018-01-11 MED ORDER — ALTEPLASE 2 MG IJ SOLR: 2.0000 mg | Freq: Once | INTRAMUSCULAR | Status: DC | PRN

## 2018-01-11 NOTE — Progress Notes (Signed)
[facility] Progress Note   Subjective:  Seen on HD. UF 3.5L  Tolerating well. No complaints  For TEE today   Objective Vitals:   01/11/18 0900 01/11/18 0930 01/11/18 1000 01/11/18 1030  BP: 131/82 (!) 122/95 107/89 101/69  Pulse: (!) 106 63 96 (!) 107  Resp:      Temp:      TempSrc:      SpO2:      Weight:      Height:       Physical Exam General: chronically ill appearing male NAD Heart: RRR Lungs: CTAB Abdomen: soft NT/ND Extremities: 1+ LE edema  Dialysis Access: R IJ TDC dsg clean, intact   Dialysis Orders:  [facility] MWF 4h 400/600 2K/2.5Ca EDW 79.5kg  TDC No heparin bolus Epogen 6000 U  TIW Venofer 50mg  IV q week  No VDRA   Assessment/Plan: 1. Discitis/paraspinal abscess: Per primary. ID following. S/p IR for aspiration 9/29. IV Vanc/Fortaz started per ID. Cultures pending. TEE today to evaluate for endocarditis.  2. ESRD -  HD MWF. HD today on schedule. No heparin   3. Hypertension/volume  - BP controlled. On Coreg, daily furosemide- voids regularly. Way above EDW by weights here. Will check post HD standing weight today and continue to titrate volume down as tolerate.   4. Anemia  - HGB 7.7  On outpatient ESA/Fe - Resume ESA  Transfuse if < 7 5.  Metabolic bone disease -  Corr Ca/Phos controlled. Ca acetate binder. No VDRA.  6. Nutrition - Albumin low. Renal Diet, nepro, renal vit.  7. AFIB-coumadin on hold. Starting heparin gtt.  8. COPD-per primary  9. S/P evacuation of L axilla hematoma/ligation of AVF per Dr. Donzetta Matters 12/06/17. WOC consulted. Seen by [facility] 12/30/2017. Future perm access placement deferred until this wound heals.   Lynnda Child PA-C [facility] Pager [phone] 01/11/2018,10:36 AM  LOS: 2 days   Additional Objective Labs: Basic Metabolic Panel: Recent Labs  Lab 01/10/18 0600 01/11/18 0511 01/11/18 0724  NA 136 138 135  K 4.4 4.7 4.3  CL 103 105 104  CO2 24 22 25   GLUCOSE 100* 101* 100*  BUN  33* 35* 28*  CREATININE 4.58* 4.78* 3.94*  CALCIUM 7.7* 7.9* 7.8*  PHOS  --   --  4.1   CBC: Recent Labs  Lab 01/09/18 1524 01/10/18 0600 01/11/18 0511 01/11/18 0724  WBC 10.4 7.0 7.6 4.9  NEUTROABS 8.4*  --   --   --   HGB 8.8* 7.5* 7.6* 7.7*  HCT 27.8* 24.5* 25.4* 25.7*  MCV 96.2 98.4 100.0 100.8*  PLT 391 277 264 241   Blood Culture    Component Value Date/Time   SDES ABSCESS 01/10/2018 1117   SPECREQUEST NONE 01/10/2018 1117   CULT PENDING 01/10/2018 1117   REPTSTATUS PENDING 01/10/2018 1117    Cardiac Enzymes: No results for input(s): CKTOTAL, CKMB, CKMBINDEX, TROPONINI in the last 168 hours. CBG: Recent Labs  Lab 01/10/18 0759 01/10/18 1137 01/10/18 1547 01/10/18 2202  GLUCAP 89 87 120* 113*   Iron Studies: No results for input(s): IRON, TIBC, TRANSFERRIN, FERRITIN in the last 72 hours. Lab Results  Component Value Date   INR 1.25 01/11/2018   INR 1.52 01/10/2018   INR 1.84 01/09/2018   Medications: . sodium chloride    . sodium chloride    . cefTAZidime (FORTAZ)  IV    . heparin 1,800 Units/hr (01/11/18 3762)  . vancomycin    . vancomycin     .  albuterol  2.5 mg Nebulization QID  . bacitracin   Topical Daily  . [START ON 01/12/2018] calcitRIOL  0.25 mcg Oral Q T,Th,Sa-HD  . calcium acetate  667 mg Oral TID WC  . carvedilol  25 mg Oral BID  . Chlorhexidine Gluconate Cloth  6 each Topical Q0600  . feeding supplement (NEPRO CARB STEADY)  237 mL Oral BID BM  . fluticasone furoate-vilanterol  1 puff Inhalation Daily  . furosemide  80 mg Oral q morning - 10a  . HYDROmorphone      . nicotine  14 mg Transdermal Daily  . pantoprazole  40 mg Oral QAC supper

## 2018-01-11 NOTE — H&P (View-Only) (Signed)
INFECTIOUS DISEASE PROGRESS NOTE  ID: Aaron [name] is a 62 y.o. male with  Principal Problem:   Discitis of lumbar region Active Problems:   Essential hypertension   Esophageal reflux   Tobacco abuse   COPD (chronic obstructive pulmonary disease) (HCC)   CAD (coronary artery disease)   AF (paroxysmal atrial fibrillation) (HCC)   ESRD on dialysis ([place])   Paraspinal abscess (HCC)  Subjective: No complaints  Abtx:  Anti-infectives (From admission, onward)   Start     Dose/Rate Route Frequency Ordered Stop   01/11/18 2000  cefTAZidime (FORTAZ) 2 g in sodium chloride 0.9 % 100 mL IVPB     2 g 200 mL/hr over 30 Minutes Intravenous Every M-W-F (2000) 01/10/18 1413     01/11/18 1200  vancomycin (VANCOCIN) IVPB 1000 mg/200 mL premix     1,000 mg 200 mL/hr over 60 Minutes Intravenous Every M-W-F (Hemodialysis) 01/10/18 1413     01/10/18 1415  cefTAZidime (FORTAZ) 1 g in sodium chloride 0.9 % 100 mL IVPB     1 g 200 mL/hr over 30 Minutes Intravenous  Once 01/10/18 1413 01/10/18 1628   01/10/18 1415  vancomycin (VANCOCIN) 2,000 mg in sodium chloride 0.9 % 500 mL IVPB     2,000 mg 250 mL/hr over 120 Minutes Intravenous  Once 01/10/18 1413 01/10/18 1926      Medications:  Scheduled: . albuterol  2.5 mg Nebulization QID  . bacitracin   Topical Daily  . [START ON 01/12/2018] calcitRIOL  0.25 mcg Oral Q T,Th,Sa-HD  . calcium acetate  667 mg Oral TID WC  . carvedilol  25 mg Oral BID  . Chlorhexidine Gluconate Cloth  6 each Topical Q0600  . [START ON 01/13/2018] darbepoetin (ARANESP) injection - DIALYSIS  100 mcg Intravenous Q Wed-HD  . feeding supplement (NEPRO CARB STEADY)  237 mL Oral BID BM  . fluticasone furoate-vilanterol  1 puff Inhalation Daily  . furosemide  80 mg Oral q morning - 10a  . nicotine  14 mg Transdermal Daily  . oxyCODONE-acetaminophen      . pantoprazole  40 mg Oral QAC supper    Objective: Vital signs in last 24 hours: Temp:  [97.6 F (36.4 C)-98.3 F  (36.8 C)] 98.3 F (36.8 C) (09/30 1231) Pulse Rate:  [63-114] 113 (09/30 1231) Resp:  [16-20] 18 (09/30 1231) BP: (101-131)/(69-95) 123/76 (09/30 1231) SpO2:  [93 %-100 %] 93 % (09/30 1231) Weight:  [89.2 kg-93.2 kg] 89.2 kg (09/30 1105)   General appearance: alert, cooperative and no distress Resp: clear to auscultation bilaterally Cardio: regular rate and rhythm GI: normal findings: bowel sounds normal and soft, non-tender Extremities: LUE AVF distal wound clean.   Lab Results Recent Labs    01/11/18 0511 01/11/18 0724  WBC 7.6 4.9  HGB 7.6* 7.7*  HCT 25.4* 25.7*  NA 138 135  K 4.7 4.3  CL 105 104  CO2 22 25  BUN 35* 28*  CREATININE 4.78* 3.94*   Liver Panel Recent Labs    01/09/18 1524 01/11/18 0724  PROT 7.0  --   ALBUMIN 2.5* 2.2*  AST 12*  --   ALT 8  --   ALKPHOS 149*  --   BILITOT 0.4  --    Sedimentation Rate Recent Labs    01/11/18 0511  ESRSEDRATE 140*   C-Reactive Protein Recent Labs    01/11/18 0511  CRP 6.2*    Microbiology: Recent Results (from the past 240 hour(s))  Culture, blood (  Routine X 2) w Reflex to ID Panel     Status: None (Preliminary result)   Collection Time: 01/08/18  6:55 PM  Result Value Ref Range Status   Specimen Description BLOOD RIGHT FOREARM  Final   Special Requests   Final    BOTTLES DRAWN AEROBIC AND ANAEROBIC Blood Culture results may not be optimal due to an inadequate volume of blood received in culture bottles   Culture   Final    NO GROWTH 2 DAYS Performed at [facility], [place] [address] [name], [place] [address]    Report Status PENDING  Incomplete  Culture, blood (routine x 2)     Status: None (Preliminary result)   Collection Time: 01/09/18  3:25 PM  Result Value Ref Range Status   Specimen Description RIGHT ANTECUBITAL  Final   Special Requests   Final    BOTTLES DRAWN AEROBIC AND ANAEROBIC Blood Culture adequate volume   Culture   Final    NO GROWTH 2 DAYS Performed at [facility], [address] [place], [place] [address]    Report Status PENDING  Incomplete  Culture, blood (routine x 2)     Status: None (Preliminary result)   Collection Time: 01/09/18  3:49 PM  Result Value Ref Range Status   Specimen Description BLOOD RIGHT ARM  Final   Special Requests   Final    BOTTLES DRAWN AEROBIC AND ANAEROBIC Blood Culture adequate volume   Culture   Final    NO GROWTH 2 DAYS Performed at [facility], [address], [place], [place] [address]    Report Status PENDING  Incomplete  MRSA PCR Screening     Status: None   Collection Time: 01/09/18  8:55 PM  Result Value Ref Range Status   MRSA by PCR NEGATIVE NEGATIVE Final    Comment:        The GeneXpert MRSA Assay (FDA approved for NASAL specimens only), is one component of a comprehensive MRSA colonization surveillance program. It is not intended to diagnose MRSA infection nor to guide or monitor treatment for MRSA infections. Performed at [facility], [place] [address] [place], [place] [address]   Aerobic/Anaerobic Culture (surgical/deep wound)     Status: None (Preliminary result)   Collection Time: 01/10/18 11:17 AM  Result Value Ref Range Status   Specimen Description ABSCESS  Final   Special Requests NONE  Final   Gram Stain   Final    RARE WBC PRESENT,BOTH PMN AND MONONUCLEAR NO ORGANISMS SEEN    Culture   Final    CULTURE REINCUBATED FOR BETTER GROWTH Performed at [facility], [place] [address], [place],  [address]    Report Status PENDING  Incomplete    Studies/Results: Dg Chest 2 View  Result Date: 01/09/2018 CLINICAL DATA:  Short of breath EXAM: CHEST - 2 VIEW COMPARISON:  12/04/2017 FINDINGS: Right-sided central venous catheter tip over the SVC. Bilateral hyperinflation. Non inclusion of the CP angles on the lateral view. Streaky atelectasis or scarring at the bases. Stable borderline cardiomegaly. No pneumothorax. IMPRESSION: 1. Non inclusion of the lung bases on lateral  view. 2. Streaky atelectasis or scarring at the bases. Electronically Signed   By: Donavan Foil M.D.   On: 01/09/2018 16:16   Ct Aspiration  Result Date: 01/10/2018 INDICATION: L1-2 and L4-5 discitis.  Left L2 psoas small abscess. EXAM: CT-GUIDED ASPIRATION MEDICATIONS: The patient is currently admitted to the hospital and receiving intravenous antibiotics. The  antibiotics were administered within an appropriate time frame prior to the initiation of the procedure. ANESTHESIA/SEDATION: Fentanyl 50 mcg IV; Versed 1.5 mg IV Moderate Sedation Time:  10 minutes The patient was continuously monitored during the procedure by the interventional radiology nurse under my direct supervision. COMPLICATIONS: None immediate. PROCEDURE: Informed written consent was obtained from the patient after a thorough discussion of the procedural risks, benefits and alternatives. All questions were addressed. Maximal Sterile Barrier Technique was utilized including caps, mask, sterile gowns, sterile gloves, sterile drape, hand hygiene and skin antiseptic. A timeout was performed prior to the initiation of the procedure. The back was prepped and draped in a sterile fashion. 1% lidocaine was utilized for local anesthesia. Under CT guidance, an 18 gauge needle was inserted into the left L2 paraspinal fluid collection. Aspiration yielded 2 drops bloody fluid. FINDINGS: Imaging demonstrates needle placement in the left L2 paraspinal fluid collection. IMPRESSION: Successful left paraspinal L2 aspiration yielding 2 drops bloody fluid. Electronically Signed   By: Marybelle Killings M.D.   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  Await BCx         Bobby Rumpf MD, FACP Infectious Diseases (pager) [phone] [email or web address] 01/11/2018, 2:15 PM  LOS: 2 days

## 2018-01-11 NOTE — Progress Notes (Signed)
[facility] for Heparin (while Warfarin on hold) Indication: atrial fibrillation  No Known Allergies  Patient Measurements: Height: 6\' 4"  (193 cm) Weight: 199 lb 4.8 oz (90.4 kg) IBW/kg (Calculated) : 86.8 Heparin Dosing Weight: 90 kg  Vital Signs: Temp: 98.2 F (36.8 C) (09/30 0501) Temp Source: Oral (09/30 0501) BP: 124/76 (09/30 0501) Pulse Rate: 102 (09/30 0501)  Labs: Recent Labs    01/09/18 1524 01/10/18 0600 01/10/18 2145 01/11/18 0511  HGB 8.8* 7.5*  --  7.6*  HCT 27.8* 24.5*  --  25.4*  PLT 391 277  --  264  LABPROT 21.1* 18.2*  --  15.6*  INR 1.84 1.52  --  1.25  HEPARINUNFRC  --   --  0.15* 0.18*  CREATININE 4.63* 4.58*  --   --     Estimated Creatinine Clearance: 20.8 mL/min (A) (by C-G formula based on SCr of 4.58 mg/dL (H)).   Medical History: Past Medical History:  Diagnosis Date  . Anxiety   . Arthritis    knees , back , shoulders (10/12/2017)  . Atrial fibrillation ([place])   . CAD (coronary artery disease)    Mild nonobstructive disease at cardiac catheterization 2002  . Chronic back pain    "back of my neck; down thru my legs" (10/12/2017)  . COPD (chronic obstructive pulmonary disease) ([place])   . Diverticulitis   . Esophageal reflux   . ESRD (end stage renal disease) on dialysis [facility])    "TTS; [place]" (11/23/2017)  . Essential hypertension   . Hepatitis C    states he no longer has this  . History of kidney stones   . History of syncope   . Hyperlipidemia   . Jerking 09/23/2014  . Myocardial infarction ([place]) 02/2017   "light one" (10/12/2017)  . PAT (paroxysmal atrial tachycardia) ([place])   . Peripheral arterial disease ([place])    Occluded left superficial femoral artery status post stent June 2016 - Dr. Trula Slade  . Pneumonia 1961  . Polycystic kidney, unspecified type   . Syncope 09/2014  . SYNCOPE 05/07/2010   Qualifier: Diagnosis of  By: Laurance Flatten, RN, BSN, Jennifer      Assessment: 14 YOM on warfarin PTA for  hx Afib - now found to have a paraspinal abscess warfarin held and reversed with Vit K 5 mg po x 1 in anticipation of future procedures. Pharmacy consulted to bridge with Heparin.  INR today is 1.52 after reversal with Vit K. Hgb 7.5 << 8.8, plts wnl - will watch closely. Noted aspiration by IR this AM.  The patient is noted to have a recent admit in June of this year and was therapeutic on a heparin rate of 1400 units/hr (14 ml/hr) - will start at this rate.  9/30 AM update: heparin level remains low despite rate increase  Goal of Therapy:  Heparin level 0.3-0.7 units/ml Monitor platelets by anticoagulation protocol: Yes   Plan:  -Inc heparin to 1800 units/hr -8 hour heparin level  Narda Bonds, PharmD, BCPS Clinical Pharmacist Phone: [phone]

## 2018-01-11 NOTE — Procedures (Signed)
   I was present at this dialysis session, have reviewed the session itself and made  appropriate changes Kelly Splinter MD [place] pager [phone]   01/11/2018, 8:23 AM

## 2018-01-11 NOTE — Progress Notes (Signed)
INFECTIOUS DISEASE PROGRESS NOTE  ID: Aaron [name] is a 62 y.o. male with  Principal Problem:   Discitis of lumbar region Active Problems:   Essential hypertension   Esophageal reflux   Tobacco abuse   COPD (chronic obstructive pulmonary disease) (HCC)   CAD (coronary artery disease)   AF (paroxysmal atrial fibrillation) (HCC)   ESRD on dialysis ([place])   Paraspinal abscess (HCC)  Subjective: No complaints  Abtx:  Anti-infectives (From admission, onward)   Start     Dose/Rate Route Frequency Ordered Stop   01/11/18 2000  cefTAZidime (FORTAZ) 2 g in sodium chloride 0.9 % 100 mL IVPB     2 g 200 mL/hr over 30 Minutes Intravenous Every M-W-F (2000) 01/10/18 1413     01/11/18 1200  vancomycin (VANCOCIN) IVPB 1000 mg/200 mL premix     1,000 mg 200 mL/hr over 60 Minutes Intravenous Every M-W-F (Hemodialysis) 01/10/18 1413     01/10/18 1415  cefTAZidime (FORTAZ) 1 g in sodium chloride 0.9 % 100 mL IVPB     1 g 200 mL/hr over 30 Minutes Intravenous  Once 01/10/18 1413 01/10/18 1628   01/10/18 1415  vancomycin (VANCOCIN) 2,000 mg in sodium chloride 0.9 % 500 mL IVPB     2,000 mg 250 mL/hr over 120 Minutes Intravenous  Once 01/10/18 1413 01/10/18 1926      Medications:  Scheduled: . albuterol  2.5 mg Nebulization QID  . bacitracin   Topical Daily  . [START ON 01/12/2018] calcitRIOL  0.25 mcg Oral Q T,Th,Sa-HD  . calcium acetate  667 mg Oral TID WC  . carvedilol  25 mg Oral BID  . Chlorhexidine Gluconate Cloth  6 each Topical Q0600  . [START ON 01/13/2018] darbepoetin (ARANESP) injection - DIALYSIS  100 mcg Intravenous Q Wed-HD  . feeding supplement (NEPRO CARB STEADY)  237 mL Oral BID BM  . fluticasone furoate-vilanterol  1 puff Inhalation Daily  . furosemide  80 mg Oral q morning - 10a  . nicotine  14 mg Transdermal Daily  . oxyCODONE-acetaminophen      . pantoprazole  40 mg Oral QAC supper    Objective: Vital signs in last 24 hours: Temp:  [97.6 F (36.4 C)-98.3 F  (36.8 C)] 98.3 F (36.8 C) (09/30 1231) Pulse Rate:  [63-114] 113 (09/30 1231) Resp:  [16-20] 18 (09/30 1231) BP: (101-131)/(69-95) 123/76 (09/30 1231) SpO2:  [93 %-100 %] 93 % (09/30 1231) Weight:  [89.2 kg-93.2 kg] 89.2 kg (09/30 1105)   General appearance: alert, cooperative and no distress Resp: clear to auscultation bilaterally Cardio: regular rate and rhythm GI: normal findings: bowel sounds normal and soft, non-tender Extremities: LUE AVF distal wound clean.   Lab Results Recent Labs    01/11/18 0511 01/11/18 0724  WBC 7.6 4.9  HGB 7.6* 7.7*  HCT 25.4* 25.7*  NA 138 135  K 4.7 4.3  CL 105 104  CO2 22 25  BUN 35* 28*  CREATININE 4.78* 3.94*   Liver Panel Recent Labs    01/09/18 1524 01/11/18 0724  PROT 7.0  --   ALBUMIN 2.5* 2.2*  AST 12*  --   ALT 8  --   ALKPHOS 149*  --   BILITOT 0.4  --    Sedimentation Rate Recent Labs    01/11/18 0511  ESRSEDRATE 140*   C-Reactive Protein Recent Labs    01/11/18 0511  CRP 6.2*    Microbiology: Recent Results (from the past 240 hour(s))  Culture, blood (  Routine X 2) w Reflex to ID Panel     Status: None (Preliminary result)   Collection Time: 01/08/18  6:55 PM  Result Value Ref Range Status   Specimen Description BLOOD RIGHT FOREARM  Final   Special Requests   Final    BOTTLES DRAWN AEROBIC AND ANAEROBIC Blood Culture results may not be optimal due to an inadequate volume of blood received in culture bottles   Culture   Final    NO GROWTH 2 DAYS Performed at [facility], [place] [address], [place], [place] [address]    Report Status PENDING  Incomplete  Culture, blood (routine x 2)     Status: None (Preliminary result)   Collection Time: 01/09/18  3:25 PM  Result Value Ref Range Status   Specimen Description RIGHT ANTECUBITAL  Final   Special Requests   Final    BOTTLES DRAWN AEROBIC AND ANAEROBIC Blood Culture adequate volume   Culture   Final    NO GROWTH 2 DAYS Performed at [facility], [address], [place], [place] [address]    Report Status PENDING  Incomplete  Culture, blood (routine x 2)     Status: None (Preliminary result)   Collection Time: 01/09/18  3:49 PM  Result Value Ref Range Status   Specimen Description BLOOD RIGHT ARM  Final   Special Requests   Final    BOTTLES DRAWN AEROBIC AND ANAEROBIC Blood Culture adequate volume   Culture   Final    NO GROWTH 2 DAYS Performed at [facility], [address] [place], [place] [address]    Report Status PENDING  Incomplete  MRSA PCR Screening     Status: None   Collection Time: 01/09/18  8:55 PM  Result Value Ref Range Status   MRSA by PCR NEGATIVE NEGATIVE Final    Comment:        The GeneXpert MRSA Assay (FDA approved for NASAL specimens only), is one component of a comprehensive MRSA colonization surveillance program. It is not intended to diagnose MRSA infection nor to guide or monitor treatment for MRSA infections. Performed at [facility], [place] [address] [place], [place] [address]   Aerobic/Anaerobic Culture (surgical/deep wound)     Status: None (Preliminary result)   Collection Time: 01/10/18 11:17 AM  Result Value Ref Range Status   Specimen Description ABSCESS  Final   Special Requests NONE  Final   Gram Stain   Final    RARE WBC PRESENT,BOTH PMN AND MONONUCLEAR NO ORGANISMS SEEN    Culture   Final    CULTURE REINCUBATED FOR BETTER GROWTH Performed at [facility], [place] [address], [place], [place] [address]    Report Status PENDING  Incomplete    Studies/Results: Dg Chest 2 View  Result Date: 01/09/2018 CLINICAL DATA:  Short of breath EXAM: CHEST - 2 VIEW COMPARISON:  12/04/2017 FINDINGS: Right-sided central venous catheter tip over the SVC. Bilateral hyperinflation. Non inclusion of the CP angles on the lateral view. Streaky atelectasis or scarring at the bases. Stable borderline cardiomegaly. No pneumothorax. IMPRESSION: 1. Non inclusion of the lung bases on lateral  view. 2. Streaky atelectasis or scarring at the bases. Electronically Signed   By: Donavan Foil M.D.   On: 01/09/2018 16:16   Ct Aspiration  Result Date: 01/10/2018 INDICATION: L1-2 and L4-5 discitis.  Left L2 psoas small abscess. EXAM: CT-GUIDED ASPIRATION MEDICATIONS: The patient is currently admitted to the hospital and receiving intravenous antibiotics. The  antibiotics were administered within an appropriate time frame prior to the initiation of the procedure. ANESTHESIA/SEDATION: Fentanyl 50 mcg IV; Versed 1.5 mg IV Moderate Sedation Time:  10 minutes The patient was continuously monitored during the procedure by the interventional radiology nurse under my direct supervision. COMPLICATIONS: None immediate. PROCEDURE: Informed written consent was obtained from the patient after a thorough discussion of the procedural risks, benefits and alternatives. All questions were addressed. Maximal Sterile Barrier Technique was utilized including caps, mask, sterile gowns, sterile gloves, sterile drape, hand hygiene and skin antiseptic. A timeout was performed prior to the initiation of the procedure. The back was prepped and draped in a sterile fashion. 1% lidocaine was utilized for local anesthesia. Under CT guidance, an 18 gauge needle was inserted into the left L2 paraspinal fluid collection. Aspiration yielded 2 drops bloody fluid. FINDINGS: Imaging demonstrates needle placement in the left L2 paraspinal fluid collection. IMPRESSION: Successful left paraspinal L2 aspiration yielding 2 drops bloody fluid. Electronically Signed   By: Marybelle Killings M.D.   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  Await BCx         Bobby Rumpf MD, FACP Infectious Diseases (pager) [phone] [email or web address] 01/11/2018, 2:15 PM  LOS: 2 days

## 2018-01-11 NOTE — Progress Notes (Signed)
ANTICOAGULATION CONSULT NOTE - Follow Up Consult  Pharmacy Consult for Heparin (warfarin on hold) Indication: atrial fibrillation  No Known Allergies  Patient Measurements: Height: 6\' 4"  (193 cm) Weight: 196 lb 10.4 oz (89.2 kg)(stood to scale ) IBW/kg (Calculated) : 86.8 Heparin Dosing Weight: 89 kg  Vital Signs: Temp: 98.3 F (36.8 C) (09/30 1231) Temp Source: Oral (09/30 1231) BP: 123/76 (09/30 1231) Pulse Rate: 113 (09/30 1231)  Labs: Recent Labs    01/09/18 1524 01/10/18 0600 01/10/18 2145 01/11/18 0511 01/11/18 0724 01/11/18 1503  HGB 8.8* 7.5*  --  7.6* 7.7*  --   HCT 27.8* 24.5*  --  25.4* 25.7*  --   PLT 391 277  --  264 241  --   LABPROT 21.1* 18.2*  --  15.6*  --   --   INR 1.84 1.52  --  1.25  --   --   HEPARINUNFRC  --   --  0.15* 0.18*  --  0.46  CREATININE 4.63* 4.58*  --  4.78* 3.94*  --    ESRD  Assessment:  62 YOM on warfarin PTA for hx Afib - now found to have a paraspinal abscess. Warfarin held and reversed with Vitamin K 5 mg po x 1 on 01/09/18 in anticipation of future procedures.  Pharmacy consulted to bridge with Heparin.  S/p aspiration of abscess in IR on 01/10/18.  TEE planned for 01/12/18.    Heparin level is therapeutic (0.46) this afternoon on 1800 units/hr.  INR down to 1.25.   Hgb remains low, but no further drop.  Aranesp due on 01/13/18.  Goal of Therapy:  Heparin level 0.3-0.7 units/ml Monitor platelets by anticoagulation protocol: Yes   Plan:   Continue heparin drip at 1800 units/hr  Daily heparin level, PT/INR and CBC.  Warfarin on hold.  Aaron [name], [place] Pager: [phone] or phone: [phone] 01/11/2018,4:40 PM

## 2018-01-11 NOTE — Progress Notes (Signed)
[facility]                                                                              Patient Demographics  Aaron [name], is a 62 y.o. male, DOB - Dec 09, 1955, BZJ:[id number]  Admit date - 01/09/2018   Admitting Physician Truett Mainland, DO  Outpatient Primary MD for the patient is [facility]  Outpatient specialists:   LOS - 2  days   Medical records reviewed and are as summarized below:    Chief Complaint  Patient presents with  . Back Pain       Brief summary   Patient is a 63 year old male with hypertension, COPD, paroxysmal A. fib on Coumadin, CAD, ESRD on HD, MWF presented with back pain.  Patient reported that he had a fall approximately 4 weeks ago with increasing midline back pain in the lumbar spine since that time.  He reported gradual diminished ability to walk, weight-bear but otherwise no bowel or bladder dysfunction.  He was evaluated by neurosurgery in [facility] who ordered an MRI which showed discitis and paraspinal abscess.   Assessment & Plan    Principal Problem:   Discitis of lumbar region, paraspinal abscess -Patient was seen in ED on 9/27 after his neurosurgeon recommended patient to go to ED.  Dr. Johnnye Sima saw the patient in ED however patient left AMA.  Patient and return to any pain ED, was transferred to [facility] for further work-up. Status post CT-guided paraspinal abscess aspiration, follow cultures Placed on IV vancomycin and Fortaz per ID recommendations Cardiology consulted for TEE, NPO at midnight, plan for tomorrow  Active Problems: ESRD on hemodialysis, MWF -Nephrology consulted, plan for hemodialysis today  Atrial fibrillation -Currently rate controlled, status post CT-guided aspiration -Patient may need further I&D or procedures, will await starting Coumadin. -Continue heparin drip  Left upper extremity wound -Appreciate wound care consult and recommendations  CAD Per patient is not on  Plavix, continue Coreg, Lasix   GERD Continue PPI  Hypertension Continue Coreg   COPD with mild exacerbation -Wheezing is improving, continue scheduled nebs   Code Status full CODE STATUS DVT Prophylaxis: Coumadin on hold, will start heparin drip Family Communication: Discussed in detail with the patient, all imaging results, lab results explained to the patient   Disposition Plan: Need to remain inpatient, TEE in a.m.  Time Spent in minutes 25 minutes  Procedures:  CT-guided aspiration  Consultants:   Interventional radiology Infectious disease Nephrology  Antimicrobials:   IV vancomycin 9/29  IV [name] 9/29   Medications  Scheduled Meds: . albuterol  2.5 mg Nebulization QID  . bacitracin   Topical Daily  . [START ON 01/12/2018] calcitRIOL  0.25 mcg Oral Q T,Th,Sa-HD  . calcium acetate  667 mg Oral TID WC  . carvedilol  25 mg Oral BID  . Chlorhexidine Gluconate Cloth  6 each Topical Q0600  . [START ON 01/13/2018] darbepoetin (ARANESP) injection - DIALYSIS  100 mcg Intravenous Q Wed-HD  . feeding supplement (NEPRO CARB STEADY)  237 mL Oral BID BM  . fluticasone furoate-vilanterol  1 puff Inhalation Daily  . furosemide  80 mg  Oral q morning - 10a  . HYDROmorphone      . nicotine  14 mg Transdermal Daily  . oxyCODONE-acetaminophen      . pantoprazole  40 mg Oral QAC supper   Continuous Infusions: . cefTAZidime (FORTAZ)  IV    . heparin 1,800 Units/hr (01/11/18 2130)  . vancomycin Stopped (01/11/18 1344)   PRN Meds:.acetaminophen **OR** acetaminophen, albuterol, HYDROmorphone (DILAUDID) injection, oxyCODONE-acetaminophen   Antibiotics   Anti-infectives (From admission, onward)   Start     Dose/Rate Route Frequency Ordered Stop   01/11/18 2000  cefTAZidime (FORTAZ) 2 g in sodium chloride 0.9 % 100 mL IVPB     2 g 200 mL/hr over 30 Minutes Intravenous Every M-W-F (2000) 01/10/18 1413     01/11/18 1200  vancomycin (VANCOCIN) IVPB 1000 mg/200 mL premix       1,000 mg 200 mL/hr over 60 Minutes Intravenous Every M-W-F (Hemodialysis) 01/10/18 1413     01/10/18 1415  cefTAZidime (FORTAZ) 1 g in sodium chloride 0.9 % 100 mL IVPB     1 g 200 mL/hr over 30 Minutes Intravenous  Once 01/10/18 1413 01/10/18 1628   01/10/18 1415  vancomycin (VANCOCIN) 2,000 mg in sodium chloride 0.9 % 500 mL IVPB     2,000 mg 250 mL/hr over 120 Minutes Intravenous  Once 01/10/18 1413 01/10/18 1926        Subjective:   Aaron [name] was seen and examined today.  No specific complaints, seen during hemodialysis.  Answered his questions about TEE.  Back pain is controlled.  No fevers or chills.  Patient denies dizziness, chest pain, shortness of breath, abdominal pain, N/V/D/C, new weakness, numbess, tingling.    Objective:   Vitals:   01/11/18 1030 01/11/18 1100 01/11/18 1105 01/11/18 1231  BP: 101/69 110/78 (!) 130/95 123/76  Pulse: (!) 107 (!) 101 93 (!) 113  Resp:   20 18  Temp:   97.9 F (36.6 C) 98.3 F (36.8 C)  TempSrc:   Oral Oral  SpO2:   97% 93%  Weight:   89.2 kg   Height:        Intake/Output Summary (Last 24 hours) at 01/11/2018 1404 Last data filed at 01/11/2018 1203 Gross per 24 hour  Intake 1525.2 ml  Output 4855 ml  Net -3329.8 ml     Wt Readings from Last 3 Encounters:  01/11/18 89.2 kg  01/08/18 96.6 kg  12/30/17 92.8 kg     Exam   General: Alert and oriented x 3, NAD  Eyes:   HEENT:  Atraumatic, normocephalic,  Cardiovascular: S1 S2 auscultated,. Regular rate and rhythm. No pedal edema b/l  Respiratory: Fairly CTA B  Gastrointestinal: Soft, nontender, nondistended, + bowel sounds  Ext: no pedal edema bilaterally  Neuro: no new FND  Musculoskeletal: No digital cyanosis, clubbing  Skin: No rashes  Psych: Normal affect and demeanor, alert and oriented x3    Data Reviewed:  I have personally reviewed following labs and imaging studies  Micro Results Recent Results (from the past 240 hour(s))  Culture,  blood (Routine X 2) w Reflex to ID Panel     Status: None (Preliminary result)   Collection Time: 01/08/18  6:55 PM  Result Value Ref Range Status   Specimen Description BLOOD RIGHT FOREARM  Final   Special Requests   Final    BOTTLES DRAWN AEROBIC AND ANAEROBIC Blood Culture results may not be optimal due to an inadequate volume of blood received in culture bottles  Culture   Final    NO GROWTH 2 DAYS Performed at [facility], [place] [address] [place], [place] [address]    Report Status PENDING  Incomplete  Culture, blood (routine x 2)     Status: None (Preliminary result)   Collection Time: 01/09/18  3:25 PM  Result Value Ref Range Status   Specimen Description RIGHT ANTECUBITAL  Final   Special Requests   Final    BOTTLES DRAWN AEROBIC AND ANAEROBIC Blood Culture adequate volume   Culture   Final    NO GROWTH 2 DAYS Performed at [facility], [address], [place], [place] [address]    Report Status PENDING  Incomplete  Culture, blood (routine x 2)     Status: None (Preliminary result)   Collection Time: 01/09/18  3:49 PM  Result Value Ref Range Status   Specimen Description BLOOD RIGHT ARM  Final   Special Requests   Final    BOTTLES DRAWN AEROBIC AND ANAEROBIC Blood Culture adequate volume   Culture   Final    NO GROWTH 2 DAYS Performed at [facility], [address] [place], [place] [address]    Report Status PENDING  Incomplete  MRSA PCR Screening     Status: None   Collection Time: 01/09/18  8:55 PM  Result Value Ref Range Status   MRSA by PCR NEGATIVE NEGATIVE Final    Comment:        The GeneXpert MRSA Assay (FDA approved for NASAL specimens only), is one component of a comprehensive MRSA colonization surveillance program. It is not intended to diagnose MRSA infection nor to guide or monitor treatment for MRSA infections. Performed at [facility], [place] [address] [place], [place] [address]   Aerobic/Anaerobic Culture (surgical/deep wound)      Status: None (Preliminary result)   Collection Time: 01/10/18 11:17 AM  Result Value Ref Range Status   Specimen Description ABSCESS  Final   Special Requests NONE  Final   Gram Stain   Final    RARE WBC PRESENT,BOTH PMN AND MONONUCLEAR NO ORGANISMS SEEN    Culture   Final    CULTURE REINCUBATED FOR BETTER GROWTH Performed at [facility], [place] [address], [place], [place] [address]    Report Status PENDING  Incomplete    Radiology Reports Dg Chest 2 View  Result Date: 01/09/2018 CLINICAL DATA:  Short of breath EXAM: CHEST - 2 VIEW COMPARISON:  12/04/2017 FINDINGS: Right-sided central venous catheter tip over the SVC. Bilateral hyperinflation. Non inclusion of the CP angles on the lateral view. Streaky atelectasis or scarring at the bases. Stable borderline cardiomegaly. No pneumothorax. IMPRESSION: 1. Non inclusion of the lung bases on lateral view. 2. Streaky atelectasis or scarring at the bases. Electronically Signed   By: Donavan Foil M.D.   On: 01/09/2018 16:16   Ct Head Wo Contrast  Result Date: 12/25/2017 CLINICAL DATA:  Fall last night injuring back of head. Patient on Coumadin EXAM: CT HEAD WITHOUT CONTRAST TECHNIQUE: Contiguous axial images were obtained from the base of the skull through the vertex without intravenous contrast. COMPARISON:  08/24/2017 FINDINGS: Brain: Ventricles, cisterns and other CSF spaces are within normal as there is minimal age related atrophic change. There is mild chronic ischemic microvascular disease. There is no mass, mass effect, shift of midline structures or acute hemorrhage. No evidence of acute infarction. Possible old lacune infarct over the left lentiform nucleus. Vascular: No hyperdense vessel or unexpected calcification. Skull:  Normal. Negative for fracture or focal lesion. Sinuses/Orbits: Orbits are normal. There is complete opacification over the right maxillary sinus and hypoplastic frontal sinuses. Mastoid air cells demonstrate minimal  opacification over the inferior left mastoid air cells. Other: None. IMPRESSION: No acute findings. Mild age related atrophic change and chronic ischemic microvascular disease. Chronic sinus inflammatory change involving the right maxillary sinus. Electronically Signed   By: Marin Olp M.D.   On: 12/25/2017 13:49   Ct Aspiration  Result Date: 01/10/2018 INDICATION: L1-2 and L4-5 discitis.  Left L2 psoas small abscess. EXAM: CT-GUIDED ASPIRATION MEDICATIONS: The patient is currently admitted to the hospital and receiving intravenous antibiotics. The antibiotics were administered within an appropriate time frame prior to the initiation of the procedure. ANESTHESIA/SEDATION: Fentanyl 50 mcg IV; Versed 1.5 mg IV Moderate Sedation Time:  10 minutes The patient was continuously monitored during the procedure by the interventional radiology nurse under my direct supervision. COMPLICATIONS: None immediate. PROCEDURE: Informed written consent was obtained from the patient after a thorough discussion of the procedural risks, benefits and alternatives. All questions were addressed. Maximal Sterile Barrier Technique was utilized including caps, mask, sterile gowns, sterile gloves, sterile drape, hand hygiene and skin antiseptic. A timeout was performed prior to the initiation of the procedure. The back was prepped and draped in a sterile fashion. 1% lidocaine was utilized for local anesthesia. Under CT guidance, an 18 gauge needle was inserted into the left L2 paraspinal fluid collection. Aspiration yielded 2 drops bloody fluid. FINDINGS: Imaging demonstrates needle placement in the left L2 paraspinal fluid collection. IMPRESSION: Successful left paraspinal L2 aspiration yielding 2 drops bloody fluid. Electronically Signed   By: Marybelle Killings M.D.   On: 01/10/2018 16:00    Lab Data:  CBC: Recent Labs  Lab 01/09/18 1524 01/10/18 0600 01/11/18 0511 01/11/18 0724  WBC 10.4 7.0 7.6 4.9  NEUTROABS 8.4*  --   --    --   HGB 8.8* 7.5* 7.6* 7.7*  HCT 27.8* 24.5* 25.4* 25.7*  MCV 96.2 98.4 100.0 100.8*  PLT 391 277 264 161   Basic Metabolic Panel: Recent Labs  Lab 01/09/18 1524 01/10/18 0600 01/11/18 0511 01/11/18 0724  NA 133* 136 138 135  K 4.3 4.4 4.7 4.3  CL 97* 103 105 104  CO2 26 24 22 25   GLUCOSE 111* 100* 101* 100*  BUN 32* 33* 35* 28*  CREATININE 4.63* 4.58* 4.78* 3.94*  CALCIUM 8.0* 7.7* 7.9* 7.8*  PHOS  --   --   --  4.1   GFR: Estimated Creatinine Clearance: 24.2 mL/min (A) (by C-G formula based on SCr of 3.94 mg/dL (H)). Liver Function Tests: Recent Labs  Lab 01/09/18 1524 01/11/18 0724  AST 12*  --   ALT 8  --   ALKPHOS 149*  --   BILITOT 0.4  --   PROT 7.0  --   ALBUMIN 2.5* 2.2*   No results for input(s): LIPASE, AMYLASE in the last 168 hours. No results for input(s): AMMONIA in the last 168 hours. Coagulation Profile: Recent Labs  Lab 01/06/18 01/09/18 1524 01/10/18 0600 01/11/18 0511  INR 1.8* 1.84 1.52 1.25   Cardiac Enzymes: No results for input(s): CKTOTAL, CKMB, CKMBINDEX, TROPONINI in the last 168 hours. BNP (last 3 results) No results for input(s): PROBNP in the last 8760 hours. HbA1C: No results for input(s): HGBA1C in the last 72 hours. CBG: Recent Labs  Lab 01/10/18 0759 01/10/18 1137 01/10/18 1547 01/10/18 2202  GLUCAP 89 87 120* 113*  Lipid Profile: No results for input(s): CHOL, HDL, LDLCALC, TRIG, CHOLHDL, LDLDIRECT in the last 72 hours. Thyroid Function Tests: No results for input(s): TSH, T4TOTAL, FREET4, T3FREE, THYROIDAB in the last 72 hours. Anemia Panel: No results for input(s): VITAMINB12, FOLATE, FERRITIN, TIBC, IRON, RETICCTPCT in the last 72 hours. Urine analysis:    Component Value Date/Time   COLORURINE YELLOW 09/19/2017 0701   APPEARANCEUR CLEAR 09/19/2017 0701   LABSPEC 1.008 09/19/2017 0701   PHURINE 6.0 09/19/2017 0701   GLUCOSEU NEGATIVE 09/19/2017 0701   HGBUR SMALL (A) 09/19/2017 0701   BILIRUBINUR  NEGATIVE 09/19/2017 0701   KETONESUR NEGATIVE 09/19/2017 0701   PROTEINUR 30 (A) 09/19/2017 0701   UROBILINOGEN 0.2 03/03/2010 1056   NITRITE NEGATIVE 09/19/2017 0701   LEUKOCYTESUR NEGATIVE 09/19/2017 0701     Terrica Duecker M.D. [facility] 01/11/2018, 2:04 PM  Pager: [phone] Between 7am to 7pm - call Pager - [phone]  After 7pm go to [email or web address] - password [email or web address]  Call night coverage person covering after 7pm

## 2018-01-11 NOTE — Progress Notes (Signed)
    [facility] has been requested to perform a transesophageal echocardiogram on 9/30 for discitis.  After careful review of history and examination, the risks and benefits of transesophageal echocardiogram have been explained including risks of esophageal damage, perforation (1:10,000 risk), bleeding, pharyngeal hematoma as well as other potential complications associated with conscious sedation including aspiration, arrhythmia, respiratory failure and death. Alternatives to treatment were discussed, questions were answered. Patient is willing to proceed.   62 yo male with PMH of ESRD, anemia, CAD, HTN, HLD, and PAF who presented with discitis that require CT guided aspiration. ID recommended TEE to rule out endocarditis. Vital stable. Platelet stable. Hemoglobin 7.7.    Almyra Deforest, PA-C 01/11/2018 10:31 AM

## 2018-01-12 ENCOUNTER — Encounter (HOSPITAL_COMMUNITY)
Admission: EM | Discharge status: Against Medical Advice | Payer: Self-pay | Source: Home / Self Care | Attending: Internal Medicine

## 2018-01-12 ENCOUNTER — Inpatient Hospital Stay (HOSPITAL_COMMUNITY): Payer: 59

## 2018-01-12 DIAGNOSIS — M4646 Discitis, unspecified, lumbar region: Secondary | ICD-10-CM

## 2018-01-12 DIAGNOSIS — N186 End stage renal disease: Secondary | ICD-10-CM

## 2018-01-12 DIAGNOSIS — R7881 Bacteremia: Secondary | ICD-10-CM

## 2018-01-12 DIAGNOSIS — I340 Nonrheumatic mitral (valve) insufficiency: Secondary | ICD-10-CM

## 2018-01-12 DIAGNOSIS — B9689 Other specified bacterial agents as the cause of diseases classified elsewhere: Secondary | ICD-10-CM

## 2018-01-12 DIAGNOSIS — G061 Intraspinal abscess and granuloma: Principal | ICD-10-CM

## 2018-01-12 DIAGNOSIS — Z992 Dependence on renal dialysis: Secondary | ICD-10-CM

## 2018-01-12 DIAGNOSIS — T82898D Other specified complication of vascular prosthetic devices, implants and grafts, subsequent encounter: Secondary | ICD-10-CM

## 2018-01-12 HISTORY — PX: TEE WITHOUT CARDIOVERSION: SHX5443

## 2018-01-12 LAB — BASIC METABOLIC PANEL
Anion gap: 8 (ref 5–15)
BUN: 16 mg/dL (ref 8–23)
CALCIUM: 7.9 mg/dL — AB (ref 8.9–10.3)
CO2: 25 mmol/L (ref 22–32)
CREATININE: 3.08 mg/dL — AB (ref 0.61–1.24)
Chloride: 102 mmol/L (ref 98–111)
GFR, EST AFRICAN AMERICAN: 24 mL/min — AB (ref >60)
GFR, EST NON AFRICAN AMERICAN: 20 mL/min — AB (ref >60)
Glucose, Bld: 95 mg/dL (ref 70–99)
Potassium: 3.7 mmol/L (ref 3.5–5.1)
SODIUM: 135 mmol/L (ref 135–145)

## 2018-01-12 LAB — CBC
HCT: 25.7 % — ABNORMAL LOW (ref 39.0–52.0)
HEMOGLOBIN: 7.9 g/dL — AB (ref 13.0–17.0)
MCH: 30 pg (ref 26.0–34.0)
MCHC: 30.7 g/dL (ref 30.0–36.0)
MCV: 97.7 fL (ref 78.0–100.0)
Platelets: 230 10*3/uL (ref 150–400)
RBC: 2.63 MIL/uL — ABNORMAL LOW (ref 4.22–5.81)
RDW: 19.1 % — AB (ref 11.5–15.5)
WBC: 6.9 10*3/uL (ref 4.0–10.5)

## 2018-01-12 LAB — PROTIME-INR
INR: 1.12
PROTHROMBIN TIME: 14.3 s (ref 11.4–15.2)

## 2018-01-12 LAB — HEPARIN LEVEL (UNFRACTIONATED): HEPARIN UNFRACTIONATED: 0.39 [IU]/mL (ref 0.30–0.70)

## 2018-01-12 SURGERY — ECHOCARDIOGRAM, TRANSESOPHAGEAL
Anesthesia: Moderate Sedation

## 2018-01-12 MED ORDER — MIDAZOLAM HCL 10 MG/2ML IJ SOLN
INTRAMUSCULAR | Status: DC | PRN
  Administered 2018-01-12 (×4): 2 mg via INTRAVENOUS
  Administered 2018-01-12: 1 mg via INTRAVENOUS
  Administered 2018-01-12: 2 mg via INTRAVENOUS

## 2018-01-12 MED ORDER — DIPHENHYDRAMINE HCL 50 MG/ML IJ SOLN
INTRAMUSCULAR | Status: DC | PRN
  Administered 2018-01-12 (×2): 25 mg via INTRAVENOUS

## 2018-01-12 MED ORDER — SODIUM CHLORIDE 0.9 % IV SOLN: 100.0000 mL | INTRAVENOUS | Status: DC | PRN

## 2018-01-12 MED ORDER — FENTANYL CITRATE (PF) 100 MCG/2ML IJ SOLN
INTRAMUSCULAR | Status: AC
  Filled 2018-01-12: qty 2

## 2018-01-12 MED ORDER — MIDAZOLAM HCL 5 MG/ML IJ SOLN
INTRAMUSCULAR | Status: AC
  Filled 2018-01-12: qty 2

## 2018-01-12 MED ORDER — BUTAMBEN-TETRACAINE-BENZOCAINE 2-2-14 % EX AERO
INHALATION_SPRAY | CUTANEOUS | Status: DC | PRN
  Administered 2018-01-12: 2 via TOPICAL

## 2018-01-12 MED ORDER — FENTANYL CITRATE (PF) 100 MCG/2ML IJ SOLN
INTRAMUSCULAR | Status: DC | PRN
  Administered 2018-01-12 (×4): 25 ug via INTRAVENOUS

## 2018-01-12 MED ORDER — ALTEPLASE 2 MG IJ SOLR: 2.0000 mg | Freq: Once | INTRAMUSCULAR | Status: DC | PRN

## 2018-01-12 MED ORDER — SODIUM CHLORIDE 0.9 % IV SOLN
Freq: Once | INTRAVENOUS | Status: AC
  Administered 2018-01-12: 10:00:00 via INTRAVENOUS

## 2018-01-12 MED ORDER — HEPARIN SODIUM (PORCINE) 1000 UNIT/ML DIALYSIS: 1000.0000 [IU] | INTRAMUSCULAR | Status: DC | PRN

## 2018-01-12 NOTE — Progress Notes (Signed)
[facility]                                                                              Patient Demographics  Aaron [name], is a 62 y.o. male, DOB - 04-26-1955, HBZ:[id number]  Admit date - 01/09/2018   Admitting Physician Truett Mainland, DO  Outpatient Primary MD for the patient is [facility]  Outpatient specialists:   LOS - 3  days   Medical records reviewed and are as summarized below:    Chief Complaint  Patient presents with  . Back Pain       Brief summary   Patient is a 62 year old male with hypertension, COPD, paroxysmal A. fib on Coumadin, CAD, ESRD on HD, MWF presented with back pain.  Patient reported that he had a fall approximately 4 weeks ago with increasing midline back pain in the lumbar spine since that time.  He reported gradual diminished ability to walk, weight-bear but otherwise no bowel or bladder dysfunction.  He was evaluated by neurosurgery in [facility] who ordered an MRI which showed discitis and paraspinal abscess.   Assessment & Plan    Principal Problem:   Discitis of lumbar region, paraspinal abscess -Patient was seen in ED on 9/27 after his neurosurgeon recommended patient to go to ED.  Dr. Johnnye Sima saw the patient in ED however patient left AMA.  Patient and return to any pain ED, was transferred to [facility] for further work-up. - Status post CT-guided paraspinal abscess aspiration, cultures are showing bacillus -ID following placed on IV vancomycin and Fortaz per ID recommendations - TEE today : No vegetations  Active Problems: ESRD on hemodialysis, MWF -Nephrology consulted, plan for hemodialysis    Atrial fibrillation -Currently rate controlled, status post CT-guided aspiration -Patient may need further I&D or procedures, will await starting Coumadin. -cont heparin gtt, TEE done today   Left upper extremity wound -Appreciate wound care consult and recommendations  CAD Per patient is not  on Plavix, continue Coreg, Lasix  GERD Continue PPI  Hypertension Continue Coreg  COPD with mild exacerbation -wheezing improving, cont scheduled nebs    Code Status full CODE STATUS DVT Prophylaxis: Coumadin on hold, will start heparin drip Family Communication: Discussed in detail with the patient, all imaging results, lab results explained to the patient   Disposition Plan: TEE today, discitis Cultures showing bacillus, awaiting ID recommendations  Time Spent in minutes 25 minutes  Procedures:  CT-guided aspiration TEE; no vegetations  Consultants:   Interventional radiology Infectious disease Nephrology  Antimicrobials:   IV vancomycin 9/29  IV [name] 9/29   Medications  Scheduled Meds: . albuterol  2.5 mg Nebulization QID  . bacitracin   Topical Daily  . calcitRIOL  0.25 mcg Oral Q T,Th,Sa-HD  . calcium acetate  667 mg Oral TID WC  . carvedilol  25 mg Oral BID  . Chlorhexidine Gluconate Cloth  6 each Topical Q0600  . [START ON 01/13/2018] darbepoetin (ARANESP) injection - DIALYSIS  100 mcg Intravenous Q Wed-HD  . feeding supplement (NEPRO CARB STEADY)  237 mL Oral BID BM  . fluticasone furoate-vilanterol  1 puff Inhalation Daily  .  furosemide  80 mg Oral q morning - 10a  . nicotine  14 mg Transdermal Daily  . pantoprazole  40 mg Oral QAC supper   Continuous Infusions: . cefTAZidime (FORTAZ)  IV 2 g (01/11/18 2001)  . heparin 1,800 Units/hr (01/12/18 0743)  . vancomycin Stopped (01/11/18 1344)   PRN Meds:.acetaminophen **OR** acetaminophen, albuterol, HYDROmorphone (DILAUDID) injection, oxyCODONE-acetaminophen   Antibiotics   Anti-infectives (From admission, onward)   Start     Dose/Rate Route Frequency Ordered Stop   01/11/18 2000  cefTAZidime (FORTAZ) 2 g in sodium chloride 0.9 % 100 mL IVPB     2 g 200 mL/hr over 30 Minutes Intravenous Every M-W-F (2000) 01/10/18 1413     01/11/18 1200  vancomycin (VANCOCIN) IVPB 1000 mg/200 mL premix      1,000 mg 200 mL/hr over 60 Minutes Intravenous Every M-W-F (Hemodialysis) 01/10/18 1413     01/10/18 1415  cefTAZidime (FORTAZ) 1 g in sodium chloride 0.9 % 100 mL IVPB     1 g 200 mL/hr over 30 Minutes Intravenous  Once 01/10/18 1413 01/10/18 1628   01/10/18 1415  vancomycin (VANCOCIN) 2,000 mg in sodium chloride 0.9 % 500 mL IVPB     2,000 mg 250 mL/hr over 120 Minutes Intravenous  Once 01/10/18 1413 01/10/18 1926        Subjective:   Aaron [name] was seen and examined today am.  Slight wheezing bilaterally had just received breathing treatment.  No fevers or chills.  Awaiting for TEE at the time of my examination.  No chest pain, fevers, chills, abdominal pain.  Back pain controlled.    Objective:   Vitals:   01/12/18 1120 01/12/18 1135 01/12/18 1150 01/12/18 1230  BP: 129/82 130/77 137/83 124/87  Pulse: (!) 103 99 (!) 123 84  Resp: 15 12 15 18   Temp:    97.9 F (36.6 C)  TempSrc:    Oral  SpO2: 98% 100% 99% 95%  Weight:      Height:        Intake/Output Summary (Last 24 hours) at 01/12/2018 1324 Last data filed at 01/12/2018 1100 Gross per 24 hour  Intake 1140 ml  Output 300 ml  Net 840 ml     Wt Readings from Last 3 Encounters:  01/12/18 89.2 kg  01/08/18 96.6 kg  12/30/17 92.8 kg     Exam General: Alert and oriented x 3, NAD Eyes:  HEENT:  Atraumatic, normocephalic Cardiovascular: S1 S2 auscultated, RRR no pedal edema b/l Respiratory: Bilateral expiratory wheezing Gastrointestinal: Soft, nontender, nondistended, + bowel sounds Ext: no pedal edema bilaterally Neuro: no FND's Musculoskeletal: No digital cyanosis, clubbing Skin: Left upper extremity aVF distal wound Psych: Normal affect and demeanor, alert and oriented x3      Data Reviewed:  I have personally reviewed following labs and imaging studies  Micro Results Recent Results (from the past 240 hour(s))  Culture, blood (Routine X 2) w Reflex to ID Panel     Status: None (Preliminary result)     Collection Time: 01/08/18  6:55 PM  Result Value Ref Range Status   Specimen Description BLOOD RIGHT FOREARM  Final   Special Requests   Final    BOTTLES DRAWN AEROBIC AND ANAEROBIC Blood Culture results may not be optimal due to an inadequate volume of blood received in culture bottles   Culture   Final    NO GROWTH 4 DAYS Performed at [facility], [place] [address], [place], [place] [address]  Report Status PENDING  Incomplete  Culture, blood (routine x 2)     Status: None (Preliminary result)   Collection Time: 01/09/18  3:25 PM  Result Value Ref Range Status   Specimen Description RIGHT ANTECUBITAL  Final   Special Requests   Final    BOTTLES DRAWN AEROBIC AND ANAEROBIC Blood Culture adequate volume   Culture   Final    NO GROWTH 3 DAYS Performed at [facility], [address], [place], [place] [address]    Report Status PENDING  Incomplete  Culture, blood (routine x 2)     Status: None (Preliminary result)   Collection Time: 01/09/18  3:49 PM  Result Value Ref Range Status   Specimen Description BLOOD RIGHT ARM  Final   Special Requests   Final    BOTTLES DRAWN AEROBIC AND ANAEROBIC Blood Culture adequate volume   Culture   Final    NO GROWTH 3 DAYS Performed at [facility], [address] [place], [place] [address]    Report Status PENDING  Incomplete  MRSA PCR Screening     Status: None   Collection Time: 01/09/18  8:55 PM  Result Value Ref Range Status   MRSA by PCR NEGATIVE NEGATIVE Final    Comment:        The GeneXpert MRSA Assay (FDA approved for NASAL specimens only), is one component of a comprehensive MRSA colonization surveillance program. It is not intended to diagnose MRSA infection nor to guide or monitor treatment for MRSA infections. Performed at [facility], [place] [address], [place], [place] [address]   Aerobic/Anaerobic Culture (surgical/deep wound)     Status: Abnormal (Preliminary result)   Collection Time: 01/10/18 11:17 AM   Result Value Ref Range Status   Specimen Description ABSCESS  Final   Special Requests NONE  Final   Gram Stain   Final    RARE WBC PRESENT,BOTH PMN AND MONONUCLEAR NO ORGANISMS SEEN Performed at [facility], [place] [address] [place],  [address]    Culture (A)  Final    BACILLUS SPECIES Standardized susceptibility testing for this organism is not available. NO ANAEROBES ISOLATED; CULTURE IN PROGRESS FOR 5 DAYS    Report Status PENDING  Incomplete    Radiology Reports Dg Chest 2 View  Result Date: 01/09/2018 CLINICAL DATA:  Short of breath EXAM: CHEST - 2 VIEW COMPARISON:  12/04/2017 FINDINGS: Right-sided central venous catheter tip over the SVC. Bilateral hyperinflation. Non inclusion of the CP angles on the lateral view. Streaky atelectasis or scarring at the bases. Stable borderline cardiomegaly. No pneumothorax. IMPRESSION: 1. Non inclusion of the lung bases on lateral view. 2. Streaky atelectasis or scarring at the bases. Electronically Signed   By: Donavan Foil M.D.   On: 01/09/2018 16:16   Ct Head Wo Contrast  Result Date: 12/25/2017 CLINICAL DATA:  Fall last night injuring back of head. Patient on Coumadin EXAM: CT HEAD WITHOUT CONTRAST TECHNIQUE: Contiguous axial images were obtained from the base of the skull through the vertex without intravenous contrast. COMPARISON:  08/24/2017 FINDINGS: Brain: Ventricles, cisterns and other CSF spaces are within normal as there is minimal age related atrophic change. There is mild chronic ischemic microvascular disease. There is no mass, mass effect, shift of midline structures or acute hemorrhage. No evidence of acute infarction. Possible old lacune infarct over the left lentiform nucleus. Vascular: No hyperdense vessel or unexpected calcification. Skull: Normal. Negative for fracture or focal lesion. Sinuses/Orbits: Orbits are normal. There  is complete opacification over the right maxillary sinus and hypoplastic frontal sinuses.  Mastoid air cells demonstrate minimal opacification over the inferior left mastoid air cells. Other: None. IMPRESSION: No acute findings. Mild age related atrophic change and chronic ischemic microvascular disease. Chronic sinus inflammatory change involving the right maxillary sinus. Electronically Signed   By: Marin Olp M.D.   On: 12/25/2017 13:49   Ct Aspiration  Result Date: 01/10/2018 INDICATION: L1-2 and L4-5 discitis.  Left L2 psoas small abscess. EXAM: CT-GUIDED ASPIRATION MEDICATIONS: The patient is currently admitted to the hospital and receiving intravenous antibiotics. The antibiotics were administered within an appropriate time frame prior to the initiation of the procedure. ANESTHESIA/SEDATION: Fentanyl 50 mcg IV; Versed 1.5 mg IV Moderate Sedation Time:  10 minutes The patient was continuously monitored during the procedure by the interventional radiology nurse under my direct supervision. COMPLICATIONS: None immediate. PROCEDURE: Informed written consent was obtained from the patient after a thorough discussion of the procedural risks, benefits and alternatives. All questions were addressed. Maximal Sterile Barrier Technique was utilized including caps, mask, sterile gowns, sterile gloves, sterile drape, hand hygiene and skin antiseptic. A timeout was performed prior to the initiation of the procedure. The back was prepped and draped in a sterile fashion. 1% lidocaine was utilized for local anesthesia. Under CT guidance, an 18 gauge needle was inserted into the left L2 paraspinal fluid collection. Aspiration yielded 2 drops bloody fluid. FINDINGS: Imaging demonstrates needle placement in the left L2 paraspinal fluid collection. IMPRESSION: Successful left paraspinal L2 aspiration yielding 2 drops bloody fluid. Electronically Signed   By: Marybelle Killings M.D.   On: 01/10/2018 16:00    Lab Data:  CBC: Recent Labs  Lab 01/09/18 1524 01/10/18 0600 01/11/18 0511 01/11/18 0724  01/12/18 0536  WBC 10.4 7.0 7.6 4.9 6.9  NEUTROABS 8.4*  --   --   --   --   HGB 8.8* 7.5* 7.6* 7.7* 7.9*  HCT 27.8* 24.5* 25.4* 25.7* 25.7*  MCV 96.2 98.4 100.0 100.8* 97.7  PLT 391 277 264 241 166   Basic Metabolic Panel: Recent Labs  Lab 01/09/18 1524 01/10/18 0600 01/11/18 0511 01/11/18 0724 01/12/18 0536  NA 133* 136 138 135 135  K 4.3 4.4 4.7 4.3 3.7  CL 97* 103 105 104 102  CO2 26 24 22 25 25   GLUCOSE 111* 100* 101* 100* 95  BUN 32* 33* 35* 28* 16  CREATININE 4.63* 4.58* 4.78* 3.94* 3.08*  CALCIUM 8.0* 7.7* 7.9* 7.8* 7.9*  PHOS  --   --   --  4.1  --    GFR: Estimated Creatinine Clearance: 30.9 mL/min (A) (by C-G formula based on SCr of 3.08 mg/dL (H)). Liver Function Tests: Recent Labs  Lab 01/09/18 1524 01/11/18 0724  AST 12*  --   ALT 8  --   ALKPHOS 149*  --   BILITOT 0.4  --   PROT 7.0  --   ALBUMIN 2.5* 2.2*   No results for input(s): LIPASE, AMYLASE in the last 168 hours. No results for input(s): AMMONIA in the last 168 hours. Coagulation Profile: Recent Labs  Lab 01/06/18 01/09/18 1524 01/10/18 0600 01/11/18 0511 01/12/18 0536  INR 1.8* 1.84 1.52 1.25 1.12   Cardiac Enzymes: No results for input(s): CKTOTAL, CKMB, CKMBINDEX, TROPONINI in the last 168 hours. BNP (last 3 results) No results for input(s): PROBNP in the last 8760 hours. HbA1C: No results for input(s): HGBA1C in the last 72 hours. CBG: Recent Labs  Lab  01/10/18 0759 01/10/18 1137 01/10/18 1547 01/10/18 2202  GLUCAP 89 87 120* 113*   Lipid Profile: No results for input(s): CHOL, HDL, LDLCALC, TRIG, CHOLHDL, LDLDIRECT in the last 72 hours. Thyroid Function Tests: No results for input(s): TSH, T4TOTAL, FREET4, T3FREE, THYROIDAB in the last 72 hours. Anemia Panel: No results for input(s): VITAMINB12, FOLATE, FERRITIN, TIBC, IRON, RETICCTPCT in the last 72 hours. Urine analysis:    Component Value Date/Time   COLORURINE YELLOW 09/19/2017 0701   APPEARANCEUR CLEAR  09/19/2017 0701   LABSPEC 1.008 09/19/2017 0701   PHURINE 6.0 09/19/2017 0701   GLUCOSEU NEGATIVE 09/19/2017 0701   HGBUR SMALL (A) 09/19/2017 0701   BILIRUBINUR NEGATIVE 09/19/2017 0701   KETONESUR NEGATIVE 09/19/2017 0701   PROTEINUR 30 (A) 09/19/2017 0701   UROBILINOGEN 0.2 03/03/2010 1056   NITRITE NEGATIVE 09/19/2017 0701   LEUKOCYTESUR NEGATIVE 09/19/2017 0701     Tessa Seaberry M.D. [facility] 01/12/2018, 1:24 PM  Pager: [phone] Between 7am to 7pm - call Pager - [phone]  After 7pm go to [email or web address] - password [email or web address]  Call night coverage person covering after 7pm

## 2018-01-12 NOTE — Progress Notes (Signed)
INFECTIOUS DISEASE PROGRESS NOTE  ID: NNAEMEKA SAMSON is a 62 y.o. male with  Principal Problem:   Discitis of lumbar region Active Problems:   Essential hypertension   Esophageal reflux   Tobacco abuse   COPD (chronic obstructive pulmonary disease) (HCC)   CAD (coronary artery disease)   AF (paroxysmal atrial fibrillation) (HCC)   Bacteremia   ESRD on dialysis ([place])   Paraspinal abscess (HCC)  Subjective: No complaints, on HD.   Abtx:  Anti-infectives (From admission, onward)   Start     Dose/Rate Route Frequency Ordered Stop   01/11/18 2000  cefTAZidime (FORTAZ) 2 g in sodium chloride 0.9 % 100 mL IVPB     2 g 200 mL/hr over 30 Minutes Intravenous Every M-W-F (2000) 01/10/18 1413     01/11/18 1200  vancomycin (VANCOCIN) IVPB 1000 mg/200 mL premix     1,000 mg 200 mL/hr over 60 Minutes Intravenous Every M-W-F (Hemodialysis) 01/10/18 1413     01/10/18 1415  cefTAZidime (FORTAZ) 1 g in sodium chloride 0.9 % 100 mL IVPB     1 g 200 mL/hr over 30 Minutes Intravenous  Once 01/10/18 1413 01/10/18 1628   01/10/18 1415  vancomycin (VANCOCIN) 2,000 mg in sodium chloride 0.9 % 500 mL IVPB     2,000 mg 250 mL/hr over 120 Minutes Intravenous  Once 01/10/18 1413 01/10/18 1926      Medications:  Scheduled: . albuterol  2.5 mg Nebulization QID  . bacitracin   Topical Daily  . calcitRIOL  0.25 mcg Oral Q T,Th,Sa-HD  . calcium acetate  667 mg Oral TID WC  . carvedilol  25 mg Oral BID  . Chlorhexidine Gluconate Cloth  6 each Topical Q0600  . [START ON 01/13/2018] darbepoetin (ARANESP) injection - DIALYSIS  100 mcg Intravenous Q Wed-HD  . feeding supplement (NEPRO CARB STEADY)  237 mL Oral BID BM  . fluticasone furoate-vilanterol  1 puff Inhalation Daily  . furosemide  80 mg Oral q morning - 10a  . nicotine  14 mg Transdermal Daily  . pantoprazole  40 mg Oral QAC supper    Objective: Vital signs in last 24 hours: Temp:  [97.8 F (36.6 C)-99 F (37.2 C)] 97.9 F (36.6 C)  (10/01 1230) Pulse Rate:  [51-123] 84 (10/01 1230) Resp:  [12-21] 18 (10/01 1230) BP: (99-144)/(64-99) 124/87 (10/01 1230) SpO2:  [94 %-100 %] 95 % (10/01 1230) Weight:  [89.2 kg] 89.2 kg (10/01 1009)   General appearance: alert, cooperative and no distress Resp: clear to auscultation bilaterally Chest wall: R chest HD line, non-tender.  Cardio: regular rate and rhythm GI: normal findings: bowel sounds normal and soft, non-tender  Lab Results Recent Labs    01/11/18 0724 01/12/18 0536  WBC 4.9 6.9  HGB 7.7* 7.9*  HCT 25.7* 25.7*  NA 135 135  K 4.3 3.7  CL 104 102  CO2 25 25  BUN 28* 16  CREATININE 3.94* 3.08*   Liver Panel Recent Labs    01/11/18 0724  ALBUMIN 2.2*   Sedimentation Rate Recent Labs    01/11/18 0511  ESRSEDRATE 140*   C-Reactive Protein Recent Labs    01/11/18 0511  CRP 6.2*    Microbiology: Recent Results (from the past 240 hour(s))  Culture, blood (Routine X 2) w Reflex to ID Panel     Status: None (Preliminary result)   Collection Time: 01/08/18  6:55 PM  Result Value Ref Range Status   Specimen Description BLOOD RIGHT FOREARM  Final   Special Requests   Final    BOTTLES DRAWN AEROBIC AND ANAEROBIC Blood Culture results may not be optimal due to an inadequate volume of blood received in culture bottles   Culture   Final    NO GROWTH 4 DAYS Performed at [facility], [place] [address], [place], [place] [address]    Report Status PENDING  Incomplete  Culture, blood (routine x 2)     Status: None (Preliminary result)   Collection Time: 01/09/18  3:25 PM  Result Value Ref Range Status   Specimen Description RIGHT ANTECUBITAL  Final   Special Requests   Final    BOTTLES DRAWN AEROBIC AND ANAEROBIC Blood Culture adequate volume   Culture   Final    NO GROWTH 3 DAYS Performed at [facility], [address] [place], [place] [address]    Report Status PENDING  Incomplete  Culture, blood (routine x 2)     Status: None (Preliminary  result)   Collection Time: 01/09/18  3:49 PM  Result Value Ref Range Status   Specimen Description BLOOD RIGHT ARM  Final   Special Requests   Final    BOTTLES DRAWN AEROBIC AND ANAEROBIC Blood Culture adequate volume   Culture   Final    NO GROWTH 3 DAYS Performed at [facility], [address] [place], [place] [address]    Report Status PENDING  Incomplete  MRSA PCR Screening     Status: None   Collection Time: 01/09/18  8:55 PM  Result Value Ref Range Status   MRSA by PCR NEGATIVE NEGATIVE Final    Comment:        The GeneXpert MRSA Assay (FDA approved for NASAL specimens only), is one component of a comprehensive MRSA colonization surveillance program. It is not intended to diagnose MRSA infection nor to guide or monitor treatment for MRSA infections. Performed at [facility], [place] [address], [place], [place] [address]   Aerobic/Anaerobic Culture (surgical/deep wound)     Status: Abnormal (Preliminary result)   Collection Time: 01/10/18 11:17 AM  Result Value Ref Range Status   Specimen Description ABSCESS  Final   Special Requests NONE  Final   Gram Stain   Final    RARE WBC PRESENT,BOTH PMN AND MONONUCLEAR NO ORGANISMS SEEN Performed at [facility], [place] [address] [place], [place] [address]    Culture (A)  Final    BACILLUS SPECIES Standardized susceptibility testing for this organism is not available. NO ANAEROBES ISOLATED; CULTURE IN PROGRESS FOR 5 DAYS    Report Status PENDING  Incomplete    Studies/Results: No results found.   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  BCx are ngtd Will need f/u MRI at end of therapy opat order placed.  Available as needed.    No Known Allergies  OPAT  Orders Discharge antibiotics: Per pharmacy protocol: vanco/ceftaz Aim for Vancomycin trough 15-20 (unless otherwise indicated) Duration: 42 days End Date: 02-21-18  [facility] Care Per Protocol:  Labs weekly while on IV antibiotics: _x_ CBC with differential __ BMP _x_ CMP _x_ CRP _x_ ESR __ Vancomycin trough __ CK   Fax weekly labs to [phone]  Clinic Follow Up Appt: Renal ID as needed.  Bobby Rumpf MD, FACP Infectious Diseases (pager) [phone] [email or web address] 01/12/2018, 3:35 PM  LOS: 3 days

## 2018-01-12 NOTE — Progress Notes (Addendum)
[facility] Progress Note   Subjective:  Seen in room. C/os back pain  HD yesterday. Net UF 3L  For TEE today   Objective Vitals:   01/11/18 2101 01/11/18 2148 01/12/18 0612 01/12/18 0705  BP: (!) 123/97  (!) 123/93   Pulse: (!) 110  (!) 103   Resp: 20  20   Temp: 98.6 F (37 C)  99 F (37.2 C)   TempSrc: Oral  Oral   SpO2: 100% 96% 99% 98%  Weight:      Height:       Physical Exam General: chronically ill appearing male NAD Heart: RRR Lungs: CTAB Abdomen: soft NT/ND Extremities: 1+ LE edema  Dialysis Access: R IJ TDC dsg clean, intact   Dialysis Orders:  [facility] MWF 4h 400/600 2K/2.5Ca EDW 79.5kg  TDC No heparin bolus Epogen 6000 U  TIW Venofer 50mg  IV q week  No VDRA   Assessment/Plan: 1. Discitis/paraspinal abscess: Per primary. ID following. S/p IR for aspiration 9/29. IV Vanc/Fortaz started per ID. Blood cultures neg. Wound cx + Bacillus sp. TEE today to evaluate for endocarditis  2. ESRD -  HD MWF. Extra HD today for volume.  No heparin   3. Hypertension/volume  - BP controlled. On Coreg, daily furosemide- voids regularly. Still 10kg over EDW by weights here. Extra HD today for volume   4. Anemia  - HGB 7.9  On outpatient ESA/Fe - Aranesp 100 on 10/2. Transfuse < 7 5. Metabolic bone disease -  Ca/Phos controlled. Ca acetate binder. No VDRA.  6. Nutrition - Albumin low. Renal Diet, nepro, renal vit.  7. AFIB-coumadin on hold. Starting heparin gtt.  8. COPD-per primary  9. S/P evacuation of L axilla hematoma/ligation of AVF per Dr. Donzetta Matters 12/06/17. WOC consulted. Seen by [facility] 12/30/2017. Future perm access placement deferred until this wound heals.   Lynnda Child PA-C [facility] Pager [phone] 01/12/2018,9:19 AM  LOS: 3 days   Pt seen, examined and agree w A/P as above.  Kelly Splinter MD [facility] pager [phone]   01/12/2018, 12:39 PM    Additional Objective Labs: Basic Metabolic  Panel: Recent Labs  Lab 01/11/18 0511 01/11/18 0724 01/12/18 0536  NA 138 135 135  K 4.7 4.3 3.7  CL 105 104 102  CO2 22 25 25   GLUCOSE 101* 100* 95  BUN 35* 28* 16  CREATININE 4.78* 3.94* 3.08*  CALCIUM 7.9* 7.8* 7.9*  PHOS  --  4.1  --    CBC: Recent Labs  Lab 01/09/18 1524 01/10/18 0600 01/11/18 0511 01/11/18 0724 01/12/18 0536  WBC 10.4 7.0 7.6 4.9 6.9  NEUTROABS 8.4*  --   --   --   --   HGB 8.8* 7.5* 7.6* 7.7* 7.9*  HCT 27.8* 24.5* 25.4* 25.7* 25.7*  MCV 96.2 98.4 100.0 100.8* 97.7  PLT 391 277 264 241 230   Blood Culture    Component Value Date/Time   SDES ABSCESS 01/10/2018 1117   SPECREQUEST NONE 01/10/2018 1117   CULT (A) 01/10/2018 1117    BACILLUS SPECIES Standardized susceptibility testing for this organism is not available. Performed at [facility], [place] [address], [place], [place] [address]    REPTSTATUS PENDING 01/10/2018 1117    Cardiac Enzymes: No results for input(s): CKTOTAL, CKMB, CKMBINDEX, TROPONINI in the last 168 hours. CBG: Recent Labs  Lab 01/10/18 0759 01/10/18 1137 01/10/18 1547 01/10/18 2202  GLUCAP 89 87 120* 113*   Iron Studies: No  results for input(s): IRON, TIBC, TRANSFERRIN, FERRITIN in the last 72 hours. Lab Results  Component Value Date   INR 1.12 01/12/2018   INR 1.25 01/11/2018   INR 1.52 01/10/2018   Medications: . cefTAZidime (FORTAZ)  IV 2 g (01/11/18 2001)  . heparin 1,800 Units/hr (01/12/18 0743)  . vancomycin Stopped (01/11/18 1344)   . albuterol  2.5 mg Nebulization QID  . bacitracin   Topical Daily  . calcitRIOL  0.25 mcg Oral Q T,Th,Sa-HD  . calcium acetate  667 mg Oral TID WC  . carvedilol  25 mg Oral BID  . Chlorhexidine Gluconate Cloth  6 each Topical Q0600  . [START ON 01/13/2018] darbepoetin (ARANESP) injection - DIALYSIS  100 mcg Intravenous Q Wed-HD  . feeding supplement (NEPRO CARB STEADY)  237 mL Oral BID BM  . fluticasone furoate-vilanterol  1 puff Inhalation Daily  . furosemide   80 mg Oral q morning - 10a  . nicotine  14 mg Transdermal Daily  . pantoprazole  40 mg Oral QAC supper

## 2018-01-12 NOTE — Interval H&P Note (Signed)
History and Physical Interval Note:  01/12/2018 10:31 AM  Aaron [name]  has presented today for surgery, with the diagnosis of bacteremia  The various methods of treatment have been discussed with the patient and family. After consideration of risks, benefits and other options for treatment, the patient has consented to  Procedure(s): TRANSESOPHAGEAL ECHOCARDIOGRAM (TEE) (N/A) as a surgical intervention .  The patient's history has been reviewed, patient examined, no change in status, stable for surgery.  I have reviewed the patient's chart and labs.  Questions were answered to the patient's satisfaction.     Kirk Ruths

## 2018-01-12 NOTE — Progress Notes (Signed)
  Echocardiogram Echocardiogram Transesophageal has been performed.  Aaron [name] 01/12/2018, 11:26 AM

## 2018-01-12 NOTE — CV Procedure (Signed)
    Transesophageal Echocardiogram Note  MANNING LUNA [id number] Apr 19, 1955  Procedure: Transesophageal Echocardiogram Indications: Bacteremia   Procedure Details Consent: Obtained Time Out: Verified patient identification, verified procedure, site/side was marked, verified correct patient position, special equipment/implants available, Radiology Safety Procedures followed,  medications/allergies/relevent history reviewed, required imaging and test results available.  Performed  Medications:  During this procedure the patient is administered a total of benadryl 50 mg, Versed 10 mg and Fentanyl 100 mcg  to achieve and maintain moderate conscious sedation.  The patient's heart rate, blood pressure, and oxygen saturation are monitored continuously during the procedure. The period of conscious sedation is 30 minutes, of which I was present face-to-face 100% of this time.  Hypokinesis of the anteroseptal wall with overall preserved LV function; no vegetations   Complications: No apparent complications Patient did tolerate procedure well.  Kirk Ruths, MD

## 2018-01-12 NOTE — Care Management Important Message (Signed)
Important Message  Patient Details  Name: Aaron [name] MRN: [id number] Date of Birth: June 25, 1955   Medicare Important Message Given:  Yes    [name] 01/12/2018, 12:55 PM

## 2018-01-12 NOTE — Progress Notes (Signed)
ANTICOAGULATION CONSULT NOTE - Follow Up Consult  Pharmacy Consult for Heparin (warfarin on hold) Indication: atrial fibrillation  No Known Allergies  Patient Measurements: Height: 6\' 4"  (193 cm) Weight: 196 lb 10.4 oz (89.2 kg)(stood to scale ) IBW/kg (Calculated) : 86.8 Heparin Dosing Weight: 89 kg  Vital Signs: Temp: 99 F (37.2 C) (10/01 0612) Temp Source: Oral (10/01 0612) BP: 123/93 (10/01 0612) Pulse Rate: 103 (10/01 0612)  Labs: Recent Labs    01/10/18 0600  01/11/18 0511 01/11/18 0724 01/11/18 1503 01/12/18 0536  HGB 7.5*  --  7.6* 7.7*  --  7.9*  HCT 24.5*  --  25.4* 25.7*  --  25.7*  PLT 277  --  264 241  --  230  LABPROT 18.2*  --  15.6*  --   --  14.3  INR 1.52  --  1.25  --   --  1.12  HEPARINUNFRC  --    < > 0.18*  --  0.46 0.39  CREATININE 4.58*  --  4.78* 3.94*  --  3.08*   < > = values in this interval not displayed.   ESRD  Assessment:  Aaron [name] on warfarin PTA for hx Afib - now found to have a paraspinal abscess. Warfarin held and reversed with Vitamin K 5 mg po x 1 on 01/09/18 in anticipation of future procedures.  Pharmacy consulted to bridge with Heparin.  S/p aspiration of abscess in IR on 01/10/18.  TEE planned for 01/12/18.    Heparin level is therapeutic (0.39) this morning on 1800 units/hr.  INR down to 1.12.   Hgb remains low, but no further drop.  Aranesp due on 01/13/18.  Goal of Therapy:  Heparin level 0.3-0.7 units/ml Monitor platelets by anticoagulation protocol: Yes   Plan:   Continue heparin drip at 1800 units/hr  Daily heparin level, PT/INR and CBC.  Warfarin on hold.  Charlene Cowdrey A. Levada Dy, PharmD, [place] Pager: [phone] Please utilize Amion for appropriate phone number to reach the unit pharmacist ([place])    01/12/2018,7:56 AM

## 2018-01-13 ENCOUNTER — Ambulatory Visit: Payer: 59

## 2018-01-13 LAB — CBC
HEMATOCRIT: 25 % — AB (ref 39.0–52.0)
HEMOGLOBIN: 7.7 g/dL — AB (ref 13.0–17.0)
MCH: 30.3 pg (ref 26.0–34.0)
MCHC: 30.8 g/dL (ref 30.0–36.0)
MCV: 98.4 fL (ref 78.0–100.0)
Platelets: 229 10*3/uL (ref 150–400)
RBC: 2.54 MIL/uL — ABNORMAL LOW (ref 4.22–5.81)
RDW: 19.3 % — ABNORMAL HIGH (ref 11.5–15.5)
WBC: 6.5 10*3/uL (ref 4.0–10.5)

## 2018-01-13 LAB — CULTURE, BLOOD (ROUTINE X 2): Culture: NO GROWTH

## 2018-01-13 LAB — BASIC METABOLIC PANEL
ANION GAP: 5 (ref 5–15)
BUN: 11 mg/dL (ref 8–23)
CHLORIDE: 104 mmol/L (ref 98–111)
CO2: 27 mmol/L (ref 22–32)
Calcium: 7.5 mg/dL — ABNORMAL LOW (ref 8.9–10.3)
Creatinine, Ser: 2.62 mg/dL — ABNORMAL HIGH (ref 0.61–1.24)
GFR calc non Af Amer: 25 mL/min — ABNORMAL LOW (ref >60)
GFR, EST AFRICAN AMERICAN: 29 mL/min — AB (ref >60)
Glucose, Bld: 97 mg/dL (ref 70–99)
Potassium: 3.7 mmol/L (ref 3.5–5.1)
Sodium: 136 mmol/L (ref 135–145)

## 2018-01-13 LAB — HEPARIN LEVEL (UNFRACTIONATED): Heparin Unfractionated: 0.35 IU/mL (ref 0.30–0.70)

## 2018-01-13 LAB — PROTIME-INR
INR: 1.14
Prothrombin Time: 14.5 seconds (ref 11.4–15.2)

## 2018-01-13 LAB — SEDIMENTATION RATE: Sed Rate: 130 mm/hr — ABNORMAL HIGH (ref 0–16)

## 2018-01-13 MED ORDER — ALBUTEROL SULFATE (2.5 MG/3ML) 0.083% IN NEBU: 2.5000 mg | INHALATION_SOLUTION | Freq: Three times a day (TID) | RESPIRATORY_TRACT | Status: DC

## 2018-01-13 NOTE — Progress Notes (Signed)
Pharmacy Antibiotic Note  Aaron [name] is a 62 y.o. male admitted on 01/09/2018 with paraspinal abscess now s/p IR aspiration today. Pharmacy has been consulted for Vancomycin and Ceftazidime dosing.   ESRD-MWF, ID planning 6 weeks of antibiotics with HD.  End date 02-21-18  Plan: - Ceftazidime 2g post HD on MWF @ 2000  - Vancomycin 1g on MWF-HD  - Will continue to follow HD schedule, culture results, and antibiotic de-escalation plans    Height: 6\' 4"  (193 cm) Weight: 188 lb 12.8 oz (85.6 kg) IBW/kg (Calculated) : 86.8  Temp (24hrs), Avg:98.1 F (36.7 C), Min:97.8 F (36.6 C), Max:98.3 F (36.8 C)  Recent Labs  Lab 01/10/18 0600 01/11/18 0511 01/11/18 0724 01/12/18 0536 01/13/18 0434  WBC 7.0 7.6 4.9 6.9 6.5  CREATININE 4.58* 4.78* 3.94* 3.08* 2.62*    Estimated Creatinine Clearance: 35.8 mL/min (A) (by C-G formula based on SCr of 2.62 mg/dL (H)).    No Known Allergies  Antimicrobials this admission: Vanc 9/29 >> Ceftazidime 9/29 >>  Dose adjustments this admission: n/a  Microbiology results: 9/28 MRSA PCR >> neg 9/29 BCx >> 9/29 IR aspirate of abscess >> bacilus  Aaron [name] A. Levada Dy, PharmD, [place] Pager: [phone] Please utilize Amion for appropriate phone number to reach the unit pharmacist ([place])   01/13/2018 7:42 AM

## 2018-01-13 NOTE — Progress Notes (Signed)
Called to room by patient who stated he was "leaving, bring me the papers to sign!" Asked patient why and explained danger of leaving while still on heparin drip. Patient said it was just time. K.Schorr texted and paged. No response as yet. [facility] called, situation explained.

## 2018-01-13 NOTE — Progress Notes (Deleted)
POST OPERATIVE OFFICE NOTE    CC:  F/u for surgery  HPI:   Aaron [name] is a 62 y.o. year old male who presents for postoperative follow-up for evacuation of hematoma in left axilla incision and ligation of transposed brachial basilic fistula by Dr. Donzetta Matters on 12/06/2017.  Patient's sister is assisting with wet-to-dry dressing changes twice daily to incisions of left arm.   He believes these incisions are healing well does not report any signs or symptoms of infection.  He is dialyzing from right IJ tunneled dialysis catheter without complication.   The patient's surgical history is also significant for left SFA atherectomy as well as right iliac and SFA stenting with common femoral artery endarterectomy with subsequent debridement due to nonhealing and fluid collections.  Right groin incision now completely healed without fluid collection.  The patient denies any rest pain or active tissue ischemia of bilateral lower extremities.  He is taking Coumadin and Plavix as prescribed.    No Known Allergies  Current Outpatient Medications  Medication Sig Dispense Refill  . B Complex-C-Folic Acid (DIALYVITE 812) 0.8 MG TABS Take 1 tablet by mouth daily.  3  . BREO ELLIPTA 200-25 MCG/INH AEPB Inhale 1 puff into the lungs every evening.     . calcitRIOL (ROCALTROL) 0.25 MCG capsule Take 1 capsule (0.25 mcg total) by mouth Every Tuesday,Thursday,and Saturday with dialysis. 30 capsule 0  . calcium acetate (PHOSLO) 667 MG capsule Take 667 mg by mouth at bedtime. Prescribed three times daily with meals  3  . carvedilol (COREG) 25 MG tablet Take 1 tablet (25 mg total) 2 (two) times daily by mouth. (Patient taking differently: Take 25 mg by mouth at bedtime. Prescribed one tablet twice daily) 180 tablet 3  . cholecalciferol (VITAMIN D) 1000 units tablet Take 2,000 Units by mouth daily.    . clopidogrel (PLAVIX) 75 MG tablet Take 75 mg by mouth daily.     . furosemide (LASIX) 40 MG tablet Take 40 mg by mouth  every morning.   5  . metoprolol succinate (TOPROL-XL) 25 MG 24 hr tablet Take 25 mg by mouth daily.     . Nutritional Supplements (FEEDING SUPPLEMENT, NEPRO CARB STEADY,) LIQD Take 237 mLs by mouth 2 (two) times daily between meals.  0  . oxyCODONE-acetaminophen (PERCOCET) 10-325 MG tablet Take 1 tablet by mouth every 6 (six) hours as needed for pain.    . pantoprazole (PROTONIX) 40 MG tablet Take 1 tablet (40 mg total) by mouth 2 (two) times daily. 60 tablet 1  . warfarin (COUMADIN) 5 MG tablet Take 1 tablet (5 mg total) by mouth daily. (Patient not taking: Reported on 01/09/2018)     No current facility-administered medications for this visit.    Facility-Administered Medications Ordered in Other Visits  Medication Dose Route Frequency Provider Last Rate Last Dose  . acetaminophen (TYLENOL) tablet 650 mg  650 mg Oral Q6H PRN Lelon Perla, MD   650 mg at 01/11/18 2109   Or  . acetaminophen (TYLENOL) suppository 650 mg  650 mg Rectal Q6H PRN Lelon Perla, MD      . albuterol (PROVENTIL) (2.5 MG/3ML) 0.083% nebulizer solution 2.5 mg  2.5 mg Nebulization Q6H PRN Lelon Perla, MD      . albuterol (PROVENTIL) (2.5 MG/3ML) 0.083% nebulizer solution 2.5 mg  2.5 mg Nebulization TID Lelon Perla, MD      . bacitracin ointment   Topical Daily Lelon Perla, MD      .  calcitRIOL (ROCALTROL) capsule 0.25 mcg  0.25 mcg Oral Q T,Th,Sa-HD Lelon Perla, MD      . calcium acetate (PHOSLO) capsule 667 mg  667 mg Oral TID WC Lelon Perla, MD   667 mg at 01/12/18 0909  . carvedilol (COREG) tablet 25 mg  25 mg Oral BID Lelon Perla, MD   25 mg at 01/12/18 2152  . cefTAZidime (FORTAZ) 2 g in sodium chloride 0.9 % 100 mL IVPB  2 g Intravenous Q M,W,F-2000 Lelon Perla, MD   Stopped at 01/11/18 2031  . Chlorhexidine Gluconate Cloth 2 % PADS 6 each  6 each Topical Q0600 Lelon Perla, MD   6 each at 01/12/18 0548  . Darbepoetin Alfa (ARANESP) injection 100 mcg  100 mcg  Intravenous Q Wed-HD Lelon Perla, MD      . feeding supplement (NEPRO CARB STEADY) liquid 237 mL  237 mL Oral BID BM Lelon Perla, MD   237 mL at 01/11/18 1151  . fluticasone furoate-vilanterol (BREO ELLIPTA) 200-25 MCG/INH 1 puff  1 puff Inhalation Daily Lelon Perla, MD   1 puff at 01/12/18 0704  . furosemide (LASIX) tablet 80 mg  80 mg Oral q morning - 10a Lelon Perla, MD   80 mg at 01/12/18 0909  . heparin ADULT infusion 100 units/mL (25000 units/257mL sodium chloride 0.45%)  1,800 Units/hr Intravenous Continuous Lelon Perla, MD   Stopped at 01/13/18 0533  . HYDROmorphone (DILAUDID) injection 1 mg  1 mg Intravenous Q3H PRN Lelon Perla, MD   1 mg at 01/13/18 0209  . nicotine (NICODERM CQ - dosed in mg/24 hours) patch 14 mg  14 mg Transdermal Daily Lelon Perla, MD   14 mg at 01/12/18 0910  . oxyCODONE-acetaminophen (PERCOCET/ROXICET) 5-325 MG per tablet 1 tablet  1 tablet Oral Q6H PRN Lelon Perla, MD   1 tablet at 01/12/18 2152  . pantoprazole (PROTONIX) EC tablet 40 mg  40 mg Oral QAC supper Lelon Perla, MD   40 mg at 01/12/18 2019  . vancomycin (VANCOCIN) IVPB 1000 mg/200 mL premix  1,000 mg Intravenous Q M,W,F-HD Lelon Perla, MD   Stopped at 01/11/18 1344     ROS:  See HPI  Physical Exam:  There were no vitals filed for this visit.  Incision:  *** Extremities:  *** Neuro: *** Abdomen:  ***  Assessment/Plan:  This is a 62 y.o. male who is s/p: ***  -***   Leontine Locket, PA-C [facility] [phone]  Clinic MD:  ***

## 2018-01-13 NOTE — Progress Notes (Signed)
K.Schorr returned call. Situation explained. IV removed. AMA  form signed.

## 2018-01-13 NOTE — Progress Notes (Signed)
ANTICOAGULATION CONSULT NOTE - Follow Up Consult  Pharmacy Consult for Heparin (warfarin on hold) Indication: atrial fibrillation  No Known Allergies  Patient Measurements: Height: 6\' 4"  (193 cm) Weight: 188 lb 12.8 oz (85.6 kg) IBW/kg (Calculated) : 86.8 Heparin Dosing Weight: 89 kg  Vital Signs: Temp: 98.3 F (36.8 C) (10/02 0446) Temp Source: Oral (10/02 0446) BP: 135/90 (10/02 0446) Pulse Rate: 87 (10/02 0446)  Labs: Recent Labs    01/11/18 0511 01/11/18 0724 01/11/18 1503 01/12/18 0536 01/13/18 0434  HGB 7.6* 7.7*  --  7.9* 7.7*  HCT 25.4* 25.7*  --  25.7* 25.0*  PLT 264 241  --  230 229  LABPROT 15.6*  --   --  14.3 14.5  INR 1.25  --   --  1.12 1.14  HEPARINUNFRC 0.18*  --  0.46 0.39 0.35  CREATININE 4.78* 3.94*  --  3.08* 2.62*   ESRD  Assessment:  62 YOM on warfarin PTA for hx Afib - now found to have a paraspinal abscess. Warfarin held and reversed with Vitamin K 5 mg po x 1 on 01/09/18 in anticipation of future procedures.  Pharmacy consulted to bridge with Heparin.  S/p aspiration of abscess in IR on 01/10/18.  TEE planned for 01/12/18.    Heparin level is therapeutic (0.35) this morning on 1800 units/hr.  INR 1.14.   Hgb remains low, but no further drop.  Aranesp due on 01/13/18.  Goal of Therapy:  Heparin level 0.3-0.7 units/ml Monitor platelets by anticoagulation protocol: Yes   Plan:   Continue heparin drip at 1800 units/hr  Daily heparin level, PT/INR and CBC.  Warfarin on hold.  Kabrea Seeney A. Levada Dy, PharmD, [place] Pager: [phone] Please utilize Amion for appropriate phone number to reach the unit pharmacist ([place])    01/13/2018,7:40 AM

## 2018-01-13 NOTE — Progress Notes (Signed)
Comments: Notified by RN that pt demanding to leave AMA. Paperwork signed. Reported to RN that his pain was not being managed. This NP offered to increase pain meds and offer extra dose now. Pt declined stating "This is not my first rodeo"! He has already called and there is someone in route to pick him up. AC was notified as well.   Jeryl Columbia. NP-C [facility] Pager [phone]

## 2018-01-14 ENCOUNTER — Encounter (HOSPITAL_COMMUNITY): Payer: Self-pay | Admitting: Cardiology

## 2018-01-14 LAB — CULTURE, BLOOD (ROUTINE X 2)
CULTURE: NO GROWTH
Culture: NO GROWTH
SPECIAL REQUESTS: ADEQUATE
SPECIAL REQUESTS: ADEQUATE

## 2018-01-15 LAB — AEROBIC/ANAEROBIC CULTURE W GRAM STAIN (SURGICAL/DEEP WOUND)

## 2018-01-15 LAB — AEROBIC/ANAEROBIC CULTURE (SURGICAL/DEEP WOUND)

## 2018-01-23 NOTE — Discharge Summary (Signed)
AMA NOTE    Patient ID: Aaron [name] MRN: [id number] DOB/AGE: 1955-08-05 62 y.o.  Admit date: 01/09/2018 Discharge date: Left AMA on 01/13/2018 morning  PLEASE NOTE THAT PATIENT LEFT AGAINST MEDICAL ADVICE. Risks of untreated infection, sepsis, death were explained in detail to the patient and he verbally understood the risks of leaving AMA.   Primary Care Physician:  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 AF (paroxysmal atrial fibrillation) ([place]) . Essential hypertension . Esophageal reflux . Tobacco abuse . COPD (chronic obstructive pulmonary disease) (HCC) exacerbation . CAD (coronary artery disease) . ESRD on hemodialysis MWF . Left upper extremity wound   Consults: Infectious disease Interventional radiology Nephrology   Recommendations for Outpatient Follow-up:  Patient left AMA apparently at 5:44 AM in the morning  TESTS THAT NEED FOLLOW-UP Patient left AMA      Allergies:  No Known Allergies   Discharge Medications: Please note that patient left AMA (against medical advice) before my encounter   Brief H and P: For complete details please refer to admission H and P, but in briefPatient is a 62 year old male with hypertension, COPD, paroxysmal A. fib on Coumadin, CAD, ESRD on HD, MWF presented with back pain.  Patient reported that he had a fall approximately 4 weeks ago with increasing midline back pain in the lumbar spine since that time.  He reported gradual diminished ability to walk, weight-bear but otherwise no bowel or bladder dysfunction.  He was evaluated by neurosurgery in [facility] who ordered an MRI which showed discitis and paraspinal abscess.  Hospital Course:  Discitis of lumbar region, paraspinal abscess -Patient was seen in ED on 9/27 after his neurosurgeon recommended patient to go to ED.  Dr. Johnnye Sima saw the patient in ED however patient left AMA.  Patient and  return to any pain ED, was transferred to [facility] for further work-up. - Status post CT-guided paraspinal abscess aspiration, cultures are showing bacillus -ID was consulted and patient was placed on IV vancomycin and Fortaz -Cardiology was consulted for TEE which was negative for vegetations  -Patient signed AMA paperwork apparently at 5:44 AM and was explained the risks of untreated discitis, abscess, sepsis, death.  Patient declined to stay in the hospital.  He had already called for the ride  ESRD on hemodialysis, MWF -Nephrology consulted, plan for hemodialysis    Atrial fibrillation -Currently rate controlled, status post CT-guided aspiration Patient was placed on heparin drip for TEE and Coumadin was held.   Left upper extremity wound Wound care was consulted  CAD Per patient is not on Plavix, continue Coreg, Lasix  GERD Continue PPI  Hypertension Continue Coreg  COPD with mild exacerbation -Wheezing was improved   Day of Discharge BP 135/90 (BP Location: Right Arm)   Pulse 87   Temp 98.3 F (36.8 C) (Oral)   Resp 10   Ht 6\' 4"  (1.93 m)   Wt 85.6 kg   SpO2 96%   BMI 22.98 kg/m   Physical Exam: Patient left AMA before my encounter for the day at 5:44 AM   The results of significant diagnostics from this hospitalization (including imaging, microbiology, ancillary and laboratory) are listed below for reference.    LAB RESULTS: Basic Metabolic Panel: No results for input(s): NA, K, CL, CO2, GLUCOSE, BUN, CREATININE, CALCIUM, MG, PHOS in the last 168 hours. Liver Function Tests: No results for input(s): AST, ALT, ALKPHOS, BILITOT, PROT, ALBUMIN in the last 168  hours. No results for input(s): LIPASE, AMYLASE in the last 168 hours. No results for input(s): AMMONIA in the last 168 hours. CBC: No results for input(s): WBC, NEUTROABS, HGB, HCT, MCV, PLT in the last 168 hours. Cardiac Enzymes: No results for input(s): CKTOTAL, CKMB, CKMBINDEX, TROPONINI  in the last 168 hours. BNP: Invalid input(s): POCBNP CBG: No results for input(s): GLUCAP in the last 168 hours.  Significant Diagnostic Studies:  Dg Chest 2 View  Result Date: 01/09/2018 CLINICAL DATA:  Short of breath EXAM: CHEST - 2 VIEW COMPARISON:  12/04/2017 FINDINGS: Right-sided central venous catheter tip over the SVC. Bilateral hyperinflation. Non inclusion of the CP angles on the lateral view. Streaky atelectasis or scarring at the bases. Stable borderline cardiomegaly. No pneumothorax. IMPRESSION: 1. Non inclusion of the lung bases on lateral view. 2. Streaky atelectasis or scarring at the bases. Electronically Signed   By: Donavan Foil M.D.   On: 01/09/2018 16:16   Ct Aspiration  Result Date: 01/10/2018 INDICATION: L1-2 and L4-5 discitis.  Left L2 psoas small abscess. EXAM: CT-GUIDED ASPIRATION MEDICATIONS: The patient is currently admitted to the hospital and receiving intravenous antibiotics. The antibiotics were administered within an appropriate time frame prior to the initiation of the procedure. ANESTHESIA/SEDATION: Fentanyl 50 mcg IV; Versed 1.5 mg IV Moderate Sedation Time:  10 minutes The patient was continuously monitored during the procedure by the interventional radiology nurse under my direct supervision. COMPLICATIONS: None immediate. PROCEDURE: Informed written consent was obtained from the patient after a thorough discussion of the procedural risks, benefits and alternatives. All questions were addressed. Maximal Sterile Barrier Technique was utilized including caps, mask, sterile gowns, sterile gloves, sterile drape, hand hygiene and skin antiseptic. A timeout was performed prior to the initiation of the procedure. The back was prepped and draped in a sterile fashion. 1% lidocaine was utilized for local anesthesia. Under CT guidance, an 18 gauge needle was inserted into the left L2 paraspinal fluid collection. Aspiration yielded 2 drops bloody fluid. FINDINGS: Imaging  demonstrates needle placement in the left L2 paraspinal fluid collection. IMPRESSION: Successful left paraspinal L2 aspiration yielding 2 drops bloody fluid. Electronically Signed   By: Marybelle Killings M.D.   On: 01/10/2018 16:00    2D ECHO:   Disposition and Follow-up:    DISPOSITION: Patient left AMA. He was advised to seek follow-up with primary care physician.     DISCHARGE FOLLOW-UP    Time spent on Discharge: 20 minutes  Signed:   Estill Cotta M.D. [facility] 01/23/2018, 12:49 PM Pager: [phone]

## 2018-01-26 ENCOUNTER — Inpatient Hospital Stay (HOSPITAL_COMMUNITY)
Admission: EM | Admit: 2018-01-26 | Discharge: 2018-01-29 | DRG: 871 | Disposition: A | Discharge status: Home / Self Care | Payer: 59 | Attending: Internal Medicine | Admitting: Internal Medicine

## 2018-01-26 ENCOUNTER — Encounter (HOSPITAL_COMMUNITY): Payer: Self-pay | Admitting: Emergency Medicine

## 2018-01-26 ENCOUNTER — Other Ambulatory Visit: Payer: Self-pay

## 2018-01-26 ENCOUNTER — Emergency Department (HOSPITAL_COMMUNITY): Payer: 59

## 2018-01-26 DIAGNOSIS — F419 Anxiety disorder, unspecified: Secondary | ICD-10-CM | POA: Diagnosis present

## 2018-01-26 DIAGNOSIS — M4620 Osteomyelitis of vertebra, site unspecified: Secondary | ICD-10-CM | POA: Diagnosis present

## 2018-01-26 DIAGNOSIS — R0602 Shortness of breath: Secondary | ICD-10-CM | POA: Diagnosis not present

## 2018-01-26 DIAGNOSIS — E785 Hyperlipidemia, unspecified: Secondary | ICD-10-CM | POA: Diagnosis present

## 2018-01-26 DIAGNOSIS — Z9119 Patient's noncompliance with other medical treatment and regimen: Secondary | ICD-10-CM

## 2018-01-26 DIAGNOSIS — Z992 Dependence on renal dialysis: Secondary | ICD-10-CM

## 2018-01-26 DIAGNOSIS — R509 Fever, unspecified: Secondary | ICD-10-CM

## 2018-01-26 DIAGNOSIS — I739 Peripheral vascular disease, unspecified: Secondary | ICD-10-CM | POA: Diagnosis present

## 2018-01-26 DIAGNOSIS — Z800 Family history of malignant neoplasm of digestive organs: Secondary | ICD-10-CM

## 2018-01-26 DIAGNOSIS — E8770 Fluid overload, unspecified: Secondary | ICD-10-CM | POA: Diagnosis present

## 2018-01-26 DIAGNOSIS — I248 Other forms of acute ischemic heart disease: Secondary | ICD-10-CM | POA: Diagnosis present

## 2018-01-26 DIAGNOSIS — I10 Essential (primary) hypertension: Secondary | ICD-10-CM | POA: Diagnosis present

## 2018-01-26 DIAGNOSIS — Z8051 Family history of malignant neoplasm of kidney: Secondary | ICD-10-CM

## 2018-01-26 DIAGNOSIS — K219 Gastro-esophageal reflux disease without esophagitis: Secondary | ICD-10-CM | POA: Diagnosis present

## 2018-01-26 DIAGNOSIS — Z79891 Long term (current) use of opiate analgesic: Secondary | ICD-10-CM

## 2018-01-26 DIAGNOSIS — I252 Old myocardial infarction: Secondary | ICD-10-CM

## 2018-01-26 DIAGNOSIS — A419 Sepsis, unspecified organism: Secondary | ICD-10-CM | POA: Diagnosis not present

## 2018-01-26 DIAGNOSIS — M4640 Discitis, unspecified, site unspecified: Secondary | ICD-10-CM

## 2018-01-26 DIAGNOSIS — G8929 Other chronic pain: Secondary | ICD-10-CM | POA: Diagnosis present

## 2018-01-26 DIAGNOSIS — Z9115 Patient's noncompliance with renal dialysis: Secondary | ICD-10-CM

## 2018-01-26 DIAGNOSIS — Z8249 Family history of ischemic heart disease and other diseases of the circulatory system: Secondary | ICD-10-CM

## 2018-01-26 DIAGNOSIS — D631 Anemia in chronic kidney disease: Secondary | ICD-10-CM | POA: Diagnosis present

## 2018-01-26 DIAGNOSIS — Z811 Family history of alcohol abuse and dependence: Secondary | ICD-10-CM

## 2018-01-26 DIAGNOSIS — Z7902 Long term (current) use of antithrombotics/antiplatelets: Secondary | ICD-10-CM

## 2018-01-26 DIAGNOSIS — D509 Iron deficiency anemia, unspecified: Secondary | ICD-10-CM | POA: Diagnosis present

## 2018-01-26 DIAGNOSIS — Q613 Polycystic kidney, unspecified: Secondary | ICD-10-CM

## 2018-01-26 DIAGNOSIS — M4646 Discitis, unspecified, lumbar region: Secondary | ICD-10-CM | POA: Diagnosis present

## 2018-01-26 DIAGNOSIS — Z87442 Personal history of urinary calculi: Secondary | ICD-10-CM

## 2018-01-26 DIAGNOSIS — Z720 Tobacco use: Secondary | ICD-10-CM | POA: Diagnosis present

## 2018-01-26 DIAGNOSIS — Z981 Arthrodesis status: Secondary | ICD-10-CM

## 2018-01-26 DIAGNOSIS — Z79899 Other long term (current) drug therapy: Secondary | ICD-10-CM

## 2018-01-26 DIAGNOSIS — J449 Chronic obstructive pulmonary disease, unspecified: Secondary | ICD-10-CM | POA: Diagnosis present

## 2018-01-26 DIAGNOSIS — N186 End stage renal disease: Secondary | ICD-10-CM | POA: Diagnosis present

## 2018-01-26 DIAGNOSIS — Z8619 Personal history of other infectious and parasitic diseases: Secondary | ICD-10-CM

## 2018-01-26 DIAGNOSIS — I70202 Unspecified atherosclerosis of native arteries of extremities, left leg: Secondary | ICD-10-CM | POA: Diagnosis present

## 2018-01-26 DIAGNOSIS — R6521 Severe sepsis with septic shock: Secondary | ICD-10-CM | POA: Diagnosis present

## 2018-01-26 DIAGNOSIS — I2510 Atherosclerotic heart disease of native coronary artery without angina pectoris: Secondary | ICD-10-CM | POA: Diagnosis present

## 2018-01-26 DIAGNOSIS — I120 Hypertensive chronic kidney disease with stage 5 chronic kidney disease or end stage renal disease: Secondary | ICD-10-CM | POA: Diagnosis present

## 2018-01-26 DIAGNOSIS — Z8041 Family history of malignant neoplasm of ovary: Secondary | ICD-10-CM

## 2018-01-26 DIAGNOSIS — F17210 Nicotine dependence, cigarettes, uncomplicated: Secondary | ICD-10-CM | POA: Diagnosis present

## 2018-01-26 DIAGNOSIS — Z7901 Long term (current) use of anticoagulants: Secondary | ICD-10-CM

## 2018-01-26 DIAGNOSIS — I480 Paroxysmal atrial fibrillation: Secondary | ICD-10-CM | POA: Diagnosis present

## 2018-01-26 HISTORY — DX: Other symptoms and signs involving appearance and behavior: R46.89

## 2018-01-26 LAB — BRAIN NATRIURETIC PEPTIDE: B Natriuretic Peptide: 600 pg/mL — ABNORMAL HIGH (ref 0.0–100.0)

## 2018-01-26 LAB — CBC WITH DIFFERENTIAL/PLATELET
Abs Immature Granulocytes: 0.03 10*3/uL (ref 0.00–0.07)
BASOS ABS: 0.1 10*3/uL (ref 0.0–0.1)
Basophils Relative: 1 %
Eosinophils Absolute: 0.1 10*3/uL (ref 0.0–0.5)
Eosinophils Relative: 1 %
HCT: 32.4 % — ABNORMAL LOW (ref 39.0–52.0)
HEMOGLOBIN: 9.9 g/dL — AB (ref 13.0–17.0)
Immature Granulocytes: 0 %
LYMPHS PCT: 6 %
Lymphs Abs: 0.7 10*3/uL (ref 0.7–4.0)
MCH: 30.1 pg (ref 26.0–34.0)
MCHC: 30.6 g/dL (ref 30.0–36.0)
MCV: 98.5 fL (ref 80.0–100.0)
Monocytes Absolute: 0.8 10*3/uL (ref 0.1–1.0)
Monocytes Relative: 7 %
NEUTROS ABS: 9 10*3/uL — AB (ref 1.7–7.7)
NEUTROS PCT: 85 %
NRBC: 0 % (ref 0.0–0.2)
Platelets: 262 10*3/uL (ref 150–400)
RBC: 3.29 MIL/uL — AB (ref 4.22–5.81)
RDW: 18.8 % — ABNORMAL HIGH (ref 11.5–15.5)
WBC: 10.6 10*3/uL — AB (ref 4.0–10.5)

## 2018-01-26 LAB — COMPREHENSIVE METABOLIC PANEL
ALBUMIN: 3.2 g/dL — AB (ref 3.5–5.0)
ALT: 9 U/L (ref 0–44)
ANION GAP: 10 (ref 5–15)
AST: 15 U/L (ref 15–41)
Alkaline Phosphatase: 130 U/L — ABNORMAL HIGH (ref 38–126)
BUN: 42 mg/dL — ABNORMAL HIGH (ref 8–23)
CO2: 20 mmol/L — AB (ref 22–32)
Calcium: 9.2 mg/dL (ref 8.9–10.3)
Chloride: 103 mmol/L (ref 98–111)
Creatinine, Ser: 4.51 mg/dL — ABNORMAL HIGH (ref 0.61–1.24)
GFR calc Af Amer: 15 mL/min — ABNORMAL LOW (ref >60)
GFR calc non Af Amer: 13 mL/min — ABNORMAL LOW (ref >60)
GLUCOSE: 100 mg/dL — AB (ref 70–99)
POTASSIUM: 4.6 mmol/L (ref 3.5–5.1)
SODIUM: 133 mmol/L — AB (ref 135–145)
Total Bilirubin: 0.6 mg/dL (ref 0.3–1.2)
Total Protein: 7.9 g/dL (ref 6.5–8.1)

## 2018-01-26 LAB — TROPONIN I: Troponin I: 0.11 ng/mL (ref <0.03)

## 2018-01-26 LAB — AMMONIA: Ammonia: 13 umol/L (ref 9–35)

## 2018-01-26 MED ORDER — SODIUM CHLORIDE 0.9 % IV SOLN
2.0000 g | Freq: Once | INTRAVENOUS | Status: AC
  Administered 2018-01-27: 2 g via INTRAVENOUS
  Filled 2018-01-26: qty 2

## 2018-01-26 NOTE — ED Triage Notes (Signed)
Pt states he hasn't had  Dialysis in three weeks. Pt reports "they pissed me off at [facility] dialysis and then the [facility] doctors pissed me off so I just haven't had dialysis" Pt reports having SOB and jerking motions.

## 2018-01-26 NOTE — ED Provider Notes (Signed)
Emergency Department Provider Note   I have reviewed the triage vital signs and the nursing notes.   HISTORY  Chief Complaint Shortness of Breath   HPI Aaron [name] is a 62 y.o. male with PMH of CAD, ESRD, COPD, and recent diagnosis of discitis in 01/09/18 and left the hospital AMA prior to completing abx returns to the emergency department with shortness of breath, somnolence, and continued back pain.  Patient is found to be febrile on arrival to the emergency department but states he has not noticed fever or significant worsening back pain at home.  He states the last time he had dialysis was the day he left [facility] AMA.  States that his dialysis center "pissed me off so I haven't been back."  He denies any chest pain or heart palpitations. Denies any leg weakness or numbness.    Past Medical History:  Diagnosis Date  . Anxiety   . Arthritis    knees , back , shoulders (10/12/2017)  . Atrial fibrillation ([place])   . CAD (coronary artery disease)    Mild nonobstructive disease at cardiac catheterization 2002  . Chronic back pain    "back of my neck; down thru my legs" (10/12/2017)  . COPD (chronic obstructive pulmonary disease) ([place])   . Diverticulitis   . Esophageal reflux   . ESRD (end stage renal disease) on dialysis [facility])    "TTS; [place]" (11/23/2017)  . Essential hypertension   . Hepatitis C    states he no longer has this  . History of kidney stones   . History of syncope   . Hyperlipidemia   . Jerking 09/23/2014  . Myocardial infarction ([place]) 02/2017   "light one" (10/12/2017)  . Non-compliant behavior    non compliant with diaylsis per daughter  . PAT (paroxysmal atrial tachycardia) ([place])   . Peripheral arterial disease ([place])    Occluded left superficial femoral artery status post stent June 2016 - Dr. Trula Slade  . Pneumonia 1961  . Polycystic kidney, unspecified type   . Syncope 09/2014  . SYNCOPE 05/07/2010   Qualifier: Diagnosis of  By: Laurance Flatten RN, BSN, [facility]       Patient Active Problem List   Diagnosis Date Noted  . Sepsis due to undetermined organism ([place]) 01/27/2018  . Discitis of lumbar region 01/09/2018  . Paraspinal abscess ([place]) 01/08/2018  . ESRD on dialysis ([place]) 12/30/2017  . Bacteremia 12/07/2017  . Hematoma of arm, right, initial encounter 11/23/2017  . Protein-calorie malnutrition, severe 10/15/2017  . Hyperlipidemia 08/24/2017  . Schatzki's ring of distal esophagus 04/27/2017  . Encounter for therapeutic drug monitoring 03/03/2017  . History of hepatitis C 02/20/2017  . Ischemic cardiomyopathy 01/27/2017  . Dyslipidemia 01/27/2017  . AF (paroxysmal atrial fibrillation) ([place]) 01/27/2017  . CAD (coronary artery disease) 01/21/2017  . Hepatitis C 12/15/2016  . Polycystic kidney disease 12/14/2016  . COPD (chronic obstructive pulmonary disease) ([place])   . COLD (chronic obstructive lung disease) ([place])   . Depression   . PAD (peripheral artery disease) ([place]) 09/19/2014  . Preop cardiovascular exam 10/28/2010  . Tobacco abuse 10/28/2010  . UNSPECIFIED IRON DEFICIENCY ANEMIA 10/04/2009  . 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  . Essential hypertension 10/04/2009  . Esophageal reflux 10/04/2009  . PRECORDIAL PAIN 10/04/2009    Past Surgical History:  Procedure Laterality Date  . ABDOMINAL AORTOGRAM N/A 08/11/2017   Procedure: ABDOMINAL AORTOGRAM;  Surgeon: Serafina Mitchell, MD;  Location: [place] CV LAB;  Service: Cardiovascular;  Laterality: N/A;  .  ANTERIOR CERVICAL DECOMP/DISCECTOMY FUSION    . APPLICATION OF WOUND VAC Right 08/14/2017   Procedure: APPLICATION OF PREVENA INCISIONAL WOUND VAC RIGHT GROIN;  Surgeon: Serafina Mitchell, MD;  Location: [facility] OR;  Service: Vascular;  Laterality: Right;  . AV FISTULA PLACEMENT Left 09/04/2017   Procedure: ARTERIOVENOUS (AV) FISTULA CREATION LEFT UPPER ARM;  Surgeon: Serafina Mitchell, MD;  Location: [place];  Service: Vascular;  Laterality: Left;  . BACK SURGERY    . BASCILIC VEIN  TRANSPOSITION Left 11/20/2017   Procedure: SECOND STAGE BASILIC VEIN TRANSPOSITION LEFT ARM;  Surgeon: Serafina Mitchell, MD;  Location: [place] [facility];  Service: Vascular;  Laterality: Left;  . BIOPSY  12/17/2016   Procedure: BIOPSY;  Surgeon: Daneil Dolin, MD;  [facility];  Service: Gastroenterology;;  gastric colon  . BIOPSY  04/29/2017   Procedure: BIOPSY;  Surgeon: Daneil Dolin, MD;  [facility];  Service: Endoscopy;;  duodenal biopsies  . BUNIONECTOMY Bilateral   . COLONOSCOPY  2008   Dr. Oneida Alar: rare sigmoid colon diverticulosis, internal hemorrhoids.   . COLONOSCOPY WITH PROPOFOL N/A 12/17/2016   dense left-sided diverticulosis, right colon ulcers s/p biopsy query occult NSAID use vs transient ischemia, not consistent with IBD. CMV stains negative.   Marland Kitchen ENDARTERECTOMY FEMORAL Right 08/14/2017   Procedure: RIGHT ILLIO-FEMORAL ENDARTERECTOMY;  Surgeon: Serafina Mitchell, MD;  [facility];  Service: Vascular;  Laterality: Right;  . ESOPHAGEAL DILATION  12/17/2016   EGD with mild Schatzki's ring s/p dilatation, small hiatal hernia, erosive gastropathy (negative H.pylori gastritis)  . ESOPHAGOGASTRODUODENOSCOPY  2008   Dr. Oneida Alar: normal esophagus without Barrett's, antritis and duodenitis, path with H.pylori gastritis  . ESOPHAGOGASTRODUODENOSCOPY (EGD) WITH PROPOFOL N/A 12/17/2016   Procedure: ESOPHAGOGASTRODUODENOSCOPY (EGD) WITH PROPOFOL;  Surgeon: Daneil Dolin, MD;  [facility];  Service: Gastroenterology;  Laterality: N/A;  . ESOPHAGOGASTRODUODENOSCOPY (EGD) WITH PROPOFOL N/A 04/29/2017   Patchy erythema of gastric mucosa diffusely, extensive inflammatory changes in duodenum, geographic ulceration and mucosal edema present, encroaching somewhat on the lumen yet still widely patent, distal second portion of duodenum appeared abnormal, path with peptic duodenitis with ulceration  . GROIN DEBRIDEMENT Right 10/14/2017   Procedure: RIGHT GROIN AND RIGHT LOWER QUADRANT  ABDOMEN DEBRIDEMENT WITH PLACEMENT OF ANTIBIOTIC BEADS;  Surgeon: Serafina Mitchell, MD;  Location: [place];  Service: Vascular;  Laterality: Right;  . HEMATOMA EVACUATION Left 12/06/2017   Procedure: EVACUATION HEMATOMA;  Surgeon: Waynetta Sandy, MD;  Location: [place];  Service: Vascular;  Laterality: Left;  . HERNIA REPAIR  1287   umbilical  . I&D EXTREMITY Right 09/21/2017   Procedure: IRRIGATION AND DEBRIDEMENT GROIN;  Surgeon: Elam Dutch, MD;  Location: [facility] OR;  Service: Vascular;  Laterality: Right;  . I&D EXTREMITY Left 11/23/2017   Procedure: Evacuation Hematoma LEFT UPPER ARM GRAFT;  Surgeon: Elam Dutch, MD;  Location: [place];  Service: Vascular;  Laterality: Left;  . INGUINAL HERNIA REPAIR Right 02/2017   [place]  . INSERTION OF DIALYSIS CATHETER Right 09/04/2017   Procedure: INSERTION OF TUNNELED  DIALYSIS CATHETER - RIGHT INTERNAL JUGULAR PLACEMENT;  Surgeon: Serafina Mitchell, MD;  Location: [place];  Service: Vascular;  Laterality: Right;  . INSERTION OF ILIAC STENT Right 08/14/2017   Procedure: INSERTION OF RIGHT COMMON ILIAC STENT 32mm x 78mm x 130cm INSERTION OF RIGHT EXTERNAL ILIAC STENT 49mm x 48mm x 130cm INSERTION OF SUPERFICIAL FERMORAL ARTERY STENT 52mm x 47mm x 130cm;  Surgeon:  Serafina Mitchell, MD;  Location: [place];  Service: Vascular;  Laterality: Right;  . IR FLUORO GUIDE CV LINE RIGHT  08/31/2017  . IR US GUIDE VASC ACCESS RIGHT  08/31/2017  . LOWER EXTREMITY ANGIOGRAPHY Right 08/11/2017   Procedure: LOWER EXTREMITY ANGIOGRAPHY;  Surgeon: Serafina Mitchell, MD;  Location: [place] CV LAB;  Service: Cardiovascular;  Laterality: Right;  . PATCH ANGIOPLASTY Right 08/14/2017   Procedure: PATCH ANGIOPLASTY USING HEMASHIELD PATCH 0.3IN Lillie [place];  Surgeon: Serafina Mitchell, MD;  [facility];  Service: Vascular;  Laterality: Right;  . PERIPHERAL VASCULAR CATHETERIZATION N/A 09/20/2014   Procedure: Abdominal Aortogram;  Surgeon: Serafina Mitchell, MD;  Location: [place]  CV LAB;  Service: Cardiovascular;  Laterality: N/A;  . POSTERIOR FUSION LUMBAR SPINE    . TEE WITHOUT CARDIOVERSION N/A 01/12/2018   Procedure: TRANSESOPHAGEAL ECHOCARDIOGRAM (TEE);  Surgeon: Lelon Perla, MD;  Location: [facility] ENDOSCOPY;  Service: Cardiovascular;  Laterality: N/A;    Allergies Patient has no known allergies.  Family History  Problem Relation Age of Onset  . Alcoholism Mother   . Heart disease Father        Massive heart attack  . Heart attack Father   . Atrial fibrillation Father   . Colon cancer Father   . Colon cancer Maternal Grandfather 45  . Alcoholism Maternal Grandfather   . Renal cancer Cousin   . Ovarian cancer Sister     Social History Social History   Tobacco Use  . Smoking status: Current Every Day Smoker    Packs/day: 2.00    Years: 47.00    Pack years: 94.00    Types: Cigarettes    Last attempt to quit: 08/03/2017    Years since quitting: 0.4  . Smokeless tobacco: Never Used  Substance Use Topics  . Alcohol use: Not Currently    Comment: 10/12/2017 alcohol free since 2017,  heavy drinker in the past  . Drug use: Not Currently    Comment: "<2003 whatever was around; nothing since"    Review of Systems  Constitutional: Positive fever/chills. Eyes: No visual changes. ENT: No sore throat. Cardiovascular: Denies chest pain. Respiratory: Positive shortness of breath. Gastrointestinal: No abdominal pain.  No nausea, no vomiting.  No diarrhea.  No constipation. Genitourinary: Negative for dysuria. Musculoskeletal: Positive for back pain. Skin: Negative for rash. Neurological: Negative for headaches, focal weakness or numbness.  10-point ROS otherwise negative.  ____________________________________________   PHYSICAL EXAM:  VITAL SIGNS: ED Triage Vitals  Enc Vitals Group     BP 01/26/18 2200 (!) 153/131     Pulse Rate 01/26/18 2200 (!) 59     Resp 01/26/18 2200 16     Temp 01/26/18 2200 (!) 100.8 F (38.2 C)     SpO2 01/26/18  2200 92 %     Pain Score 01/26/18 2201 10   Constitutional: Alert and oriented. Well appearing and in no acute distress. Eyes: Conjunctivae are normal.  Head: Atraumatic. Nose: No congestion/rhinnorhea. Mouth/Throat: Mucous membranes are moist.  Oropharynx non-erythematous. Neck: No stridor.  Cardiovascular: A-fib. Good peripheral circulation. Grossly normal heart sounds.   Respiratory: Normal respiratory effort.  No retractions. Lungs CTAB. Gastrointestinal: Soft and nontender. No distention.  Musculoskeletal: No lower extremity tenderness nor edema. No gross deformities of extremities. Neurologic:  Normal speech and language. No gross focal neurologic deficits are appreciated. Normal strength and sensation in the bilateral lower extremities.  Skin:  Skin is warm, dry and intact. No rash noted.  ____________________________________________   LABS (all labs ordered are listed, but only abnormal results are displayed)  Labs Reviewed  COMPREHENSIVE METABOLIC PANEL - Abnormal; Notable for the following components:      Result Value   Sodium 133 (*)    CO2 20 (*)    Glucose, Bld 100 (*)    BUN 42 (*)    Creatinine, Ser 4.51 (*)    Albumin 3.2 (*)    Alkaline Phosphatase 130 (*)    GFR calc non Af Amer 13 (*)    GFR calc Af Amer 15 (*)    All other components within normal limits  BRAIN NATRIURETIC PEPTIDE - Abnormal; Notable for the following components:   B Natriuretic Peptide 600.0 (*)    All other components within normal limits  TROPONIN I - Abnormal; Notable for the following components:   Troponin I 0.11 (*)    All other components within normal limits  CBC WITH DIFFERENTIAL/PLATELET - Abnormal; Notable for the following components:   WBC 10.6 (*)    RBC 3.29 (*)    Hemoglobin 9.9 (*)    HCT 32.4 (*)    RDW 18.8 (*)    Neutro Abs 9.0 (*)    All other components within normal limits  CULTURE, BLOOD (ROUTINE X 2)  CULTURE, BLOOD (ROUTINE X 2)  URINE CULTURE  AMMONIA   URINALYSIS, ROUTINE W REFLEX MICROSCOPIC   ____________________________________________  EKG  Rate: 110 Normal intervals.   A-fib with RVR. No ST elevation or depression. Normal axis. No STEMI  ____________________________________________  RADIOLOGY  Dg Chest 2 View  Result Date: 01/26/2018 CLINICAL DATA:  Shortness of breath EXAM: CHEST - 2 VIEW COMPARISON:  01/09/2018, 12/04/2017 FINDINGS: Right-sided central venous catheter tip over the SVC. Bibasilar atelectasis or scarring. No pleural effusion. No focal consolidation. Stable cardiomediastinal silhouette with aortic atherosclerosis. No pneumothorax. IMPRESSION: No active cardiopulmonary disease. Stable scarring and/or atelectasis at both lung bases. Electronically Signed   By: Donavan Foil M.D.   On: 01/26/2018 23:23    ____________________________________________   PROCEDURES  Procedure(s) performed:   Procedures  CRITICAL CARE Performed by: Margette Fast Total critical care time: 35 minutes Critical care time was exclusive of separately billable procedures and treating other patients. Critical care was necessary to treat or prevent imminent or life-threatening deterioration. Critical care was time spent personally by me on the following activities: development of treatment plan with patient and/or surrogate as well as nursing, discussions with consultants, evaluation of patient's response to treatment, examination of patient, obtaining history from patient or surrogate, ordering and performing treatments and interventions, ordering and review of laboratory studies, ordering and review of radiographic studies, pulse oximetry and re-evaluation of patient's condition.  Nanda Quinton, MD Emergency Medicine  ____________________________________________   INITIAL IMPRESSION / ASSESSMENT AND PLAN / ED COURSE  Pertinent labs & imaging results that were available during my care of the patient were reviewed by me and  considered in my medical decision making (see chart for details).  Resents to the emergency department with concern over not having dialysis in the last 2 weeks.  He is developed shortness of breath and some fatigue.  Patient's last dialysis was the day he left AMA from the hospital after being diagnosed with discitis.  He has not been on anticoagulation or antibiotics since discharge.  He is awake and alert with intact neurological exam.  CXR reviewed with no acute findings. Normal potassium. Patient tolerating being off HD fairly well. BNP is elevated.  Can like wait to have HD in the AM. Patient also with fever here. Will start abx recommended by ID during recent admit. Patient will likely require MRI of the spine for further evaluation of discitis. No other fever source identified.   Discussed patient's case with Hospitalist, Dr. Olevia Bowens to request admission. Patient and family (if present) updated with plan. Care transferred to Hospitalist service.  I reviewed all nursing notes, vitals, pertinent old records, EKGs, labs, imaging (as available).  ____________________________________________  FINAL CLINICAL IMPRESSION(S) / ED DIAGNOSES  Final diagnoses:  Shortness of breath  Discitis, unspecified spinal region  Fever, unspecified fever cause     MEDICATIONS GIVEN DURING THIS VISIT:  Medications  vancomycin (VANCOCIN) IVPB 1000 mg/200 mL premix (has no administration in time range)  metoprolol tartrate (LOPRESSOR) injection 5 mg (5 mg Intravenous Incomplete 01/27/18 0126)  cefTAZidime (FORTAZ) 2 g in sodium chloride 0.9 % 100 mL IVPB (2 g Intravenous New Bag/Given 01/27/18 0052)     Note:  This document was prepared using Dragon voice recognition software and may include unintentional dictation errors.  Nanda Quinton, MD Emergency Medicine   Oda Placke, Wonda [facility], MD 01/27/18 [phone]

## 2018-01-26 NOTE — ED Notes (Signed)
CRITICAL VALUE ALERT  Critical Value:  Trop 0.11  Date & Time Notied:  01/26/18 2347  Provider Notified: Dr. Laverta Baltimore  Orders Received/Actions taken: See chart

## 2018-01-27 ENCOUNTER — Encounter (HOSPITAL_COMMUNITY): Payer: Self-pay | Admitting: *Deleted

## 2018-01-27 ENCOUNTER — Other Ambulatory Visit: Payer: Self-pay

## 2018-01-27 DIAGNOSIS — I480 Paroxysmal atrial fibrillation: Secondary | ICD-10-CM | POA: Diagnosis not present

## 2018-01-27 DIAGNOSIS — D631 Anemia in chronic kidney disease: Secondary | ICD-10-CM | POA: Diagnosis present

## 2018-01-27 DIAGNOSIS — Z7902 Long term (current) use of antithrombotics/antiplatelets: Secondary | ICD-10-CM | POA: Diagnosis not present

## 2018-01-27 DIAGNOSIS — G8929 Other chronic pain: Secondary | ICD-10-CM | POA: Diagnosis present

## 2018-01-27 DIAGNOSIS — Z9119 Patient's noncompliance with other medical treatment and regimen: Secondary | ICD-10-CM | POA: Diagnosis not present

## 2018-01-27 DIAGNOSIS — N186 End stage renal disease: Secondary | ICD-10-CM | POA: Diagnosis present

## 2018-01-27 DIAGNOSIS — B9689 Other specified bacterial agents as the cause of diseases classified elsewhere: Secondary | ICD-10-CM

## 2018-01-27 DIAGNOSIS — F17210 Nicotine dependence, cigarettes, uncomplicated: Secondary | ICD-10-CM | POA: Diagnosis present

## 2018-01-27 DIAGNOSIS — M4646 Discitis, unspecified, lumbar region: Secondary | ICD-10-CM

## 2018-01-27 DIAGNOSIS — J449 Chronic obstructive pulmonary disease, unspecified: Secondary | ICD-10-CM | POA: Diagnosis present

## 2018-01-27 DIAGNOSIS — E785 Hyperlipidemia, unspecified: Secondary | ICD-10-CM | POA: Diagnosis present

## 2018-01-27 DIAGNOSIS — G061 Intraspinal abscess and granuloma: Secondary | ICD-10-CM | POA: Diagnosis not present

## 2018-01-27 DIAGNOSIS — Z992 Dependence on renal dialysis: Secondary | ICD-10-CM

## 2018-01-27 DIAGNOSIS — Z8619 Personal history of other infectious and parasitic diseases: Secondary | ICD-10-CM | POA: Diagnosis not present

## 2018-01-27 DIAGNOSIS — A419 Sepsis, unspecified organism: Secondary | ICD-10-CM | POA: Diagnosis present

## 2018-01-27 DIAGNOSIS — K219 Gastro-esophageal reflux disease without esophagitis: Secondary | ICD-10-CM | POA: Diagnosis present

## 2018-01-27 DIAGNOSIS — M4640 Discitis, unspecified, site unspecified: Secondary | ICD-10-CM

## 2018-01-27 DIAGNOSIS — R6521 Severe sepsis with septic shock: Secondary | ICD-10-CM | POA: Diagnosis present

## 2018-01-27 DIAGNOSIS — I2510 Atherosclerotic heart disease of native coronary artery without angina pectoris: Secondary | ICD-10-CM | POA: Diagnosis present

## 2018-01-27 DIAGNOSIS — E8770 Fluid overload, unspecified: Secondary | ICD-10-CM | POA: Diagnosis present

## 2018-01-27 DIAGNOSIS — I248 Other forms of acute ischemic heart disease: Secondary | ICD-10-CM | POA: Diagnosis present

## 2018-01-27 DIAGNOSIS — I252 Old myocardial infarction: Secondary | ICD-10-CM | POA: Diagnosis not present

## 2018-01-27 DIAGNOSIS — Z8249 Family history of ischemic heart disease and other diseases of the circulatory system: Secondary | ICD-10-CM

## 2018-01-27 DIAGNOSIS — Z981 Arthrodesis status: Secondary | ICD-10-CM | POA: Diagnosis not present

## 2018-01-27 DIAGNOSIS — R0602 Shortness of breath: Secondary | ICD-10-CM | POA: Diagnosis present

## 2018-01-27 DIAGNOSIS — Z9115 Patient's noncompliance with renal dialysis: Secondary | ICD-10-CM | POA: Diagnosis not present

## 2018-01-27 DIAGNOSIS — F419 Anxiety disorder, unspecified: Secondary | ICD-10-CM | POA: Diagnosis present

## 2018-01-27 DIAGNOSIS — I70202 Unspecified atherosclerosis of native arteries of extremities, left leg: Secondary | ICD-10-CM | POA: Diagnosis present

## 2018-01-27 DIAGNOSIS — I120 Hypertensive chronic kidney disease with stage 5 chronic kidney disease or end stage renal disease: Secondary | ICD-10-CM | POA: Diagnosis present

## 2018-01-27 DIAGNOSIS — Z7901 Long term (current) use of anticoagulants: Secondary | ICD-10-CM | POA: Diagnosis not present

## 2018-01-27 LAB — CBC WITH DIFFERENTIAL/PLATELET
Abs Immature Granulocytes: 0.03 10*3/uL (ref 0.00–0.07)
BASOS PCT: 1 %
Basophils Absolute: 0.1 10*3/uL (ref 0.0–0.1)
EOS ABS: 0.1 10*3/uL (ref 0.0–0.5)
EOS PCT: 1 %
HEMATOCRIT: 28.6 % — AB (ref 39.0–52.0)
Hemoglobin: 9 g/dL — ABNORMAL LOW (ref 13.0–17.0)
Immature Granulocytes: 0 %
LYMPHS ABS: 0.9 10*3/uL (ref 0.7–4.0)
Lymphocytes Relative: 10 %
MCH: 30.3 pg (ref 26.0–34.0)
MCHC: 31.5 g/dL (ref 30.0–36.0)
MCV: 96.3 fL (ref 80.0–100.0)
Monocytes Absolute: 0.8 10*3/uL (ref 0.1–1.0)
Monocytes Relative: 9 %
Neutro Abs: 6.6 10*3/uL (ref 1.7–7.7)
Neutrophils Relative %: 79 %
PLATELETS: 245 10*3/uL (ref 150–400)
RBC: 2.97 MIL/uL — ABNORMAL LOW (ref 4.22–5.81)
RDW: 18.6 % — AB (ref 11.5–15.5)
WBC: 8.5 10*3/uL (ref 4.0–10.5)
nRBC: 0 % (ref 0.0–0.2)

## 2018-01-27 LAB — URINALYSIS, ROUTINE W REFLEX MICROSCOPIC
BACTERIA UA: NONE SEEN
Bilirubin Urine: NEGATIVE
Glucose, UA: NEGATIVE mg/dL
KETONES UR: NEGATIVE mg/dL
LEUKOCYTES UA: NEGATIVE
Nitrite: NEGATIVE
PROTEIN: NEGATIVE mg/dL
Specific Gravity, Urine: 1.006 (ref 1.005–1.030)
pH: 5 (ref 5.0–8.0)

## 2018-01-27 LAB — PROTIME-INR
INR: 1.35
Prothrombin Time: 16.5 seconds — ABNORMAL HIGH (ref 11.4–15.2)

## 2018-01-27 LAB — TROPONIN I
TROPONIN I: 0.12 ng/mL — AB (ref <0.03)
TROPONIN I: 0.09 ng/mL — AB (ref <0.03)
Troponin I: 0.07 ng/mL (ref <0.03)

## 2018-01-27 LAB — HEPARIN LEVEL (UNFRACTIONATED)
Heparin Unfractionated: 0.18 IU/mL — ABNORMAL LOW (ref 0.30–0.70)
Heparin Unfractionated: 0.33 IU/mL (ref 0.30–0.70)

## 2018-01-27 MED ORDER — IPRATROPIUM BROMIDE 0.02 % IN SOLN
0.5000 mg | Freq: Three times a day (TID) | RESPIRATORY_TRACT | Status: DC
  Administered 2018-01-27 – 2018-01-28 (×4): 0.5 mg via RESPIRATORY_TRACT
  Filled 2018-01-27 (×4): qty 2.5

## 2018-01-27 MED ORDER — FUROSEMIDE 40 MG PO TABS
40.0000 mg | ORAL_TABLET | Freq: Every morning | ORAL | Status: DC
  Administered 2018-01-27 – 2018-01-28 (×2): 40 mg via ORAL
  Filled 2018-01-27 (×2): qty 1

## 2018-01-27 MED ORDER — CLOPIDOGREL BISULFATE 75 MG PO TABS
75.0000 mg | ORAL_TABLET | Freq: Every day | ORAL | Status: DC
  Administered 2018-01-27 – 2018-01-29 (×3): 75 mg via ORAL
  Filled 2018-01-27 (×3): qty 1

## 2018-01-27 MED ORDER — METHYLPREDNISOLONE SODIUM SUCC 40 MG IJ SOLR
40.0000 mg | Freq: Once | INTRAMUSCULAR | Status: AC
  Administered 2018-01-27: 40 mg via INTRAVENOUS
  Filled 2018-01-27: qty 1

## 2018-01-27 MED ORDER — WARFARIN SODIUM 5 MG PO TABS
5.0000 mg | ORAL_TABLET | Freq: Once | ORAL | Status: AC
  Administered 2018-01-27: 5 mg via ORAL
  Filled 2018-01-27: qty 1

## 2018-01-27 MED ORDER — VANCOMYCIN HCL IN DEXTROSE 1-5 GM/200ML-% IV SOLN
1000.0000 mg | INTRAVENOUS | Status: AC
  Administered 2018-01-27: 1000 mg via INTRAVENOUS
  Filled 2018-01-27: qty 200

## 2018-01-27 MED ORDER — ONDANSETRON HCL 4 MG/2ML IJ SOLN: 4.0000 mg | Freq: Four times a day (QID) | INTRAMUSCULAR | Status: DC | PRN

## 2018-01-27 MED ORDER — IPRATROPIUM BROMIDE 0.02 % IN SOLN
0.5000 mg | Freq: Four times a day (QID) | RESPIRATORY_TRACT | Status: DC
  Administered 2018-01-27: 0.5 mg via RESPIRATORY_TRACT
  Filled 2018-01-27: qty 2.5

## 2018-01-27 MED ORDER — HYDRALAZINE HCL 20 MG/ML IJ SOLN
10.0000 mg | Freq: Once | INTRAMUSCULAR | Status: AC
  Administered 2018-01-27: 10 mg via INTRAVENOUS
  Filled 2018-01-27: qty 1

## 2018-01-27 MED ORDER — ONDANSETRON HCL 4 MG PO TABS: 4.0000 mg | ORAL_TABLET | Freq: Four times a day (QID) | ORAL | Status: DC | PRN

## 2018-01-27 MED ORDER — NITROGLYCERIN 2 % TD OINT
1.0000 [in_us] | TOPICAL_OINTMENT | Freq: Four times a day (QID) | TRANSDERMAL | Status: AC
  Administered 2018-01-27 (×2): 1 [in_us] via TOPICAL
  Filled 2018-01-27: qty 30

## 2018-01-27 MED ORDER — LEVALBUTEROL HCL 0.63 MG/3ML IN NEBU
0.6300 mg | INHALATION_SOLUTION | Freq: Four times a day (QID) | RESPIRATORY_TRACT | Status: DC
  Administered 2018-01-27: 0.63 mg via RESPIRATORY_TRACT
  Filled 2018-01-27: qty 3

## 2018-01-27 MED ORDER — CHLORHEXIDINE GLUCONATE CLOTH 2 % EX PADS: 6.0000 | MEDICATED_PAD | Freq: Every day | CUTANEOUS | Status: DC

## 2018-01-27 MED ORDER — VANCOMYCIN HCL IN DEXTROSE 1-5 GM/200ML-% IV SOLN
1000.0000 mg | Freq: Once | INTRAVENOUS | Status: AC
  Administered 2018-01-27: 1000 mg via INTRAVENOUS
  Filled 2018-01-27: qty 200

## 2018-01-27 MED ORDER — HEPARIN BOLUS VIA INFUSION
4000.0000 [IU] | Freq: Once | INTRAVENOUS | Status: AC
  Administered 2018-01-27: 4000 [IU] via INTRAVENOUS
  Filled 2018-01-27: qty 4000

## 2018-01-27 MED ORDER — METOPROLOL TARTRATE 5 MG/5ML IV SOLN
5.0000 mg | Freq: Once | INTRAVENOUS | Status: AC
  Administered 2018-01-27: 5 mg via INTRAVENOUS
  Filled 2018-01-27: qty 5

## 2018-01-27 MED ORDER — VANCOMYCIN VARIABLE DOSE PER UNSTABLE RENAL FUNCTION (PHARMACIST DOSING): Status: DC

## 2018-01-27 MED ORDER — NITROGLYCERIN 2 % TD OINT
TOPICAL_OINTMENT | TRANSDERMAL | Status: AC
  Filled 2018-01-27: qty 1

## 2018-01-27 MED ORDER — LEVALBUTEROL HCL 0.63 MG/3ML IN NEBU
0.6300 mg | INHALATION_SOLUTION | Freq: Three times a day (TID) | RESPIRATORY_TRACT | Status: DC
  Administered 2018-01-27 – 2018-01-28 (×4): 0.63 mg via RESPIRATORY_TRACT
  Filled 2018-01-27 (×4): qty 3

## 2018-01-27 MED ORDER — HEPARIN (PORCINE) IN NACL 100-0.45 UNIT/ML-% IJ SOLN
1950.0000 [IU]/h | INTRAMUSCULAR | Status: DC
  Administered 2018-01-27: 1600 [IU]/h via INTRAVENOUS
  Administered 2018-01-27 – 2018-01-28 (×2): 1850 [IU]/h via INTRAVENOUS
  Filled 2018-01-27 (×2): qty 250

## 2018-01-27 MED ORDER — PANTOPRAZOLE SODIUM 40 MG PO TBEC
40.0000 mg | DELAYED_RELEASE_TABLET | Freq: Two times a day (BID) | ORAL | Status: DC
  Administered 2018-01-27 – 2018-01-29 (×6): 40 mg via ORAL
  Filled 2018-01-27 (×6): qty 1

## 2018-01-27 MED ORDER — OXYCODONE-ACETAMINOPHEN 10-325 MG PO TABS: 1.0000 | ORAL_TABLET | Freq: Four times a day (QID) | ORAL | Status: DC | PRN

## 2018-01-27 MED ORDER — FLUTICASONE FUROATE-VILANTEROL 200-25 MCG/INH IN AEPB
1.0000 | INHALATION_SPRAY | Freq: Every evening | RESPIRATORY_TRACT | Status: DC
  Filled 2018-01-27: qty 28

## 2018-01-27 MED ORDER — CALCIUM ACETATE (PHOS BINDER) 667 MG PO CAPS
667.0000 mg | ORAL_CAPSULE | Freq: Every day | ORAL | Status: DC
  Administered 2018-01-28: 667 mg via ORAL
  Filled 2018-01-27 (×2): qty 1

## 2018-01-27 MED ORDER — WARFARIN - PHARMACIST DOSING INPATIENT
Freq: Every day | Status: DC
  Administered 2018-01-27: 17:00:00

## 2018-01-27 MED ORDER — OXYCODONE HCL 5 MG PO TABS
5.0000 mg | ORAL_TABLET | Freq: Four times a day (QID) | ORAL | Status: DC | PRN
  Administered 2018-01-27 – 2018-01-29 (×7): 5 mg via ORAL
  Filled 2018-01-27 (×7): qty 1

## 2018-01-27 MED ORDER — NEPRO/CARBSTEADY PO LIQD
237.0000 mL | Freq: Two times a day (BID) | ORAL | Status: DC
  Administered 2018-01-27 – 2018-01-28 (×4): 237 mL via ORAL
  Filled 2018-01-27 (×6): qty 237

## 2018-01-27 MED ORDER — FUROSEMIDE 10 MG/ML IJ SOLN
80.0000 mg | Freq: Once | INTRAMUSCULAR | Status: AC
  Administered 2018-01-27: 80 mg via INTRAVENOUS
  Filled 2018-01-27: qty 8

## 2018-01-27 MED ORDER — CALCITRIOL 0.25 MCG PO CAPS: 0.2500 ug | ORAL_CAPSULE | ORAL | Status: DC

## 2018-01-27 MED ORDER — FLUTICASONE FUROATE-VILANTEROL 200-25 MCG/INH IN AEPB
1.0000 | INHALATION_SPRAY | Freq: Every day | RESPIRATORY_TRACT | Status: DC
  Administered 2018-01-27 – 2018-01-28 (×2): 1 via RESPIRATORY_TRACT
  Filled 2018-01-27: qty 28

## 2018-01-27 MED ORDER — OXYCODONE-ACETAMINOPHEN 5-325 MG PO TABS
1.0000 | ORAL_TABLET | Freq: Four times a day (QID) | ORAL | Status: DC | PRN
  Administered 2018-01-27 – 2018-01-29 (×8): 1 via ORAL
  Filled 2018-01-27 (×7): qty 1

## 2018-01-27 MED ORDER — HEPARIN BOLUS VIA INFUSION
2500.0000 [IU] | Freq: Once | INTRAVENOUS | Status: AC
  Administered 2018-01-27: 2500 [IU] via INTRAVENOUS
  Filled 2018-01-27: qty 2500

## 2018-01-27 MED ORDER — ACETAMINOPHEN 650 MG RE SUPP: 650.0000 mg | Freq: Four times a day (QID) | RECTAL | Status: DC | PRN

## 2018-01-27 MED ORDER — BUDESONIDE 0.5 MG/2ML IN SUSP
0.5000 mg | Freq: Two times a day (BID) | RESPIRATORY_TRACT | Status: DC
  Administered 2018-01-27 – 2018-01-29 (×5): 0.5 mg via RESPIRATORY_TRACT
  Filled 2018-01-27 (×5): qty 2

## 2018-01-27 MED ORDER — ACETAMINOPHEN 325 MG PO TABS: 650.0000 mg | ORAL_TABLET | Freq: Four times a day (QID) | ORAL | Status: DC | PRN

## 2018-01-27 MED ORDER — ORAL CARE MOUTH RINSE: 15.0000 mL | Freq: Two times a day (BID) | OROMUCOSAL | Status: DC

## 2018-01-27 MED ORDER — CARVEDILOL 6.25 MG PO TABS
6.2500 mg | ORAL_TABLET | Freq: Two times a day (BID) | ORAL | Status: DC
  Administered 2018-01-27 – 2018-01-29 (×6): 6.25 mg via ORAL
  Filled 2018-01-27 (×6): qty 1

## 2018-01-27 NOTE — Progress Notes (Signed)
ANTICOAGULATION CONSULT NOTE - Initial Consult  Pharmacy Consult for heparin and coumadin Indication: atrial fibrillation  No Known Allergies  Patient Measurements: Weight: 187 lb 6.3 oz (85 kg)  Vital Signs: Temp: 98.1 F (36.7 C) (10/16 0251) Temp Source: Oral (10/16 0251) BP: 160/118 (10/16 0251) Pulse Rate: 112 (10/16 0251)  Labs: Recent Labs    01/26/18 2247  HGB 9.9*  HCT 32.4*  PLT 262  CREATININE 4.51*  TROPONINI 0.11*    Estimated Creatinine Clearance: 20.7 mL/min (A) (by C-G formula based on SCr of 4.51 mg/dL (H)).   Medical History: Past Medical History:  Diagnosis Date  . Anxiety   . Arthritis    knees , back , shoulders (10/12/2017)  . Atrial fibrillation ([place])   . CAD (coronary artery disease)    Mild nonobstructive disease at cardiac catheterization 2002  . Chronic back pain    "back of my neck; down thru my legs" (10/12/2017)  . COPD (chronic obstructive pulmonary disease) ([place])   . Diverticulitis   . Esophageal reflux   . ESRD (end stage renal disease) on dialysis [facility])    "TTS; [place]" (11/23/2017)  . Essential hypertension   . Hepatitis C    states he no longer has this  . History of kidney stones   . History of syncope   . Hyperlipidemia   . Jerking 09/23/2014  . Myocardial infarction ([place]) 02/2017   "light one" (10/12/2017)  . Non-compliant behavior    non compliant with diaylsis per daughter  . PAT (paroxysmal atrial tachycardia) ([place])   . Peripheral arterial disease ([place])    Occluded left superficial femoral artery status post stent June 2016 - Dr. Trula Slade  . Pneumonia 1961  . Polycystic kidney, unspecified type   . Syncope 09/2014  . SYNCOPE 05/07/2010   Qualifier: Diagnosis of  By: Laurance Flatten, RN, BSN, [facility] [place]      Medications:  Medications Prior to Admission  Medication Sig Dispense Refill Last Dose  . B Complex-C-Folic Acid (DIALYVITE 970) 0.8 MG TABS Take 1 tablet by mouth daily.  3 Past Week at Unknown time  . BREO ELLIPTA 200-25  MCG/INH AEPB Inhale 1 puff into the lungs every evening.    Past Week at Unknown time  . calcitRIOL (ROCALTROL) 0.25 MCG capsule Take 1 capsule (0.25 mcg total) by mouth Every Tuesday,Thursday,and Saturday with dialysis. 30 capsule 0 Past Week at Unknown time  . calcium acetate (PHOSLO) 667 MG capsule Take 667 mg by mouth at bedtime. Prescribed three times daily with meals  3 Past Week at Unknown time  . carvedilol (COREG) 25 MG tablet Take 1 tablet (25 mg total) 2 (two) times daily by mouth. (Patient taking differently: Take 25 mg by mouth at bedtime. Prescribed one tablet twice daily) 180 tablet 3 Past Week at Unknown time  . cholecalciferol (VITAMIN D) 1000 units tablet Take 2,000 Units by mouth daily.   Past Week at Unknown time  . clopidogrel (PLAVIX) 75 MG tablet Take 75 mg by mouth daily.    Past Week at Unknown time  . furosemide (LASIX) 40 MG tablet Take 40 mg by mouth daily.   5 Past Week at Unknown time  . metoprolol succinate (TOPROL-XL) 25 MG 24 hr tablet Take 25 mg by mouth daily.    Past Week at Unknown time  . Nutritional Supplements (FEEDING SUPPLEMENT, NEPRO CARB STEADY,) LIQD Take 237 mLs by mouth 2 (two) times daily between meals.  0 Past Week at Unknown time  . oxyCODONE-acetaminophen (  PERCOCET) 10-325 MG tablet Take 1 tablet by mouth every 6 (six) hours as needed for pain.   Past Week at Unknown time  . pantoprazole (PROTONIX) 40 MG tablet Take 1 tablet (40 mg total) by mouth 2 (two) times daily. 60 tablet 1 Past Week at Unknown time    Assessment: 62 yo man to start heparin and coumadin for afib.  He was recently in hospital and required ~1800 units of heparin/hr.  He was not taking coumadin as prescribed Goal of Therapy:  INR 2-3 Heparin level 0.3-0.7 units/ml Monitor platelets by anticoagulation protocol: Yes   Plan:  Heparin bolus 4000 units and drip at 1600 units/hr Coumadin 5mg  po x 1 Check heparin level 8 hours after start then daily while on heparin Daily  PT/INR Monitor for bleeding complications  Tawana Pasch [place] 01/27/2018,3:29 AM

## 2018-01-27 NOTE — ED Notes (Signed)
Report to Angus with [facility]

## 2018-01-27 NOTE — Progress Notes (Signed)
ANTICOAGULATION CONSULT NOTE   Pharmacy Consult for heparin Indication: atrial fibrillation  No Known Allergies  Patient Measurements: Weight: 187 lb 6.3 oz (85 kg)  Vital Signs: Temp: 97.9 F (36.6 C) (10/16 1737) Temp Source: Oral (10/16 1737) BP: 152/79 (10/16 1737) Pulse Rate: 103 (10/16 1737)  Labs: Recent Labs    01/26/18 2247 01/27/18 0415 01/27/18 0725 01/27/18 1249 01/27/18 2055  HGB 9.9* 9.0*  --   --   --   HCT 32.4* 28.6*  --   --   --   PLT 262 245  --   --   --   LABPROT  --  16.5*  --   --   --   INR  --  1.35  --   --   --   HEPARINUNFRC  --   --   --  0.18* 0.33  CREATININE 4.51*  --   --   --   --   TROPONINI 0.11*  --  0.12* 0.09*  --     Estimated Creatinine Clearance: 20.7 mL/min (A) (by C-G formula based on SCr of 4.51 mg/dL (H)).   Medical History: Past Medical History:  Diagnosis Date  . Anxiety   . Arthritis    knees , back , shoulders (10/12/2017)  . Atrial fibrillation ([place])   . CAD (coronary artery disease)    Mild nonobstructive disease at cardiac catheterization 2002  . Chronic back pain    "back of my neck; down thru my legs" (10/12/2017)  . COPD (chronic obstructive pulmonary disease) ([place])   . Diverticulitis   . Esophageal reflux   . ESRD (end stage renal disease) on dialysis [facility])    "TTS; [place]" (11/23/2017)  . Essential hypertension   . Hepatitis C    states he no longer has this  . History of kidney stones   . History of syncope   . Hyperlipidemia   . Jerking 09/23/2014  . Myocardial infarction ([place]) 02/2017   "light one" (10/12/2017)  . Non-compliant behavior    non compliant with diaylsis per daughter  . PAT (paroxysmal atrial tachycardia) ([place])   . Peripheral arterial disease ([place])    Occluded left superficial femoral artery status post stent June 2016 - Dr. Trula Slade  . Pneumonia 1961  . Polycystic kidney, unspecified type   . Syncope 09/2014  . SYNCOPE 05/07/2010   Qualifier: Diagnosis of  By: Laurance Flatten, RN, BSN,  Anderson Malta      Medications:  Medications Prior to Admission  Medication Sig Dispense Refill Last Dose  . B Complex-C-Folic Acid (DIALYVITE 110) 0.8 MG TABS Take 1 tablet by mouth daily.  3 Past Week at Unknown time  . BREO ELLIPTA 200-25 MCG/INH AEPB Inhale 1 puff into the lungs every evening.    Past Week at Unknown time  . calcitRIOL (ROCALTROL) 0.25 MCG capsule Take 1 capsule (0.25 mcg total) by mouth Every Tuesday,Thursday,and Saturday with dialysis. 30 capsule 0 Past Week at Unknown time  . calcium acetate (PHOSLO) 667 MG capsule Take 667 mg by mouth at bedtime. Prescribed three times daily with meals  3 Past Week at Unknown time  . carvedilol (COREG) 25 MG tablet Take 1 tablet (25 mg total) 2 (two) times daily by mouth. (Patient taking differently: Take 25 mg by mouth at bedtime. Prescribed one tablet twice daily) 180 tablet 3 Past Week at Unknown time  . cholecalciferol (VITAMIN D) 1000 units tablet Take 2,000 Units by mouth daily.   Past Week at Unknown time  .  clopidogrel (PLAVIX) 75 MG tablet Take 75 mg by mouth daily.    Past Week at Unknown time  . furosemide (LASIX) 40 MG tablet Take 40 mg by mouth daily.   5 Past Week at Unknown time  . metoprolol succinate (TOPROL-XL) 25 MG 24 hr tablet Take 25 mg by mouth daily.    Past Week at Unknown time  . Nutritional Supplements (FEEDING SUPPLEMENT, NEPRO CARB STEADY,) LIQD Take 237 mLs by mouth 2 (two) times daily between meals.  0 Past Week at Unknown time  . oxyCODONE-acetaminophen (PERCOCET) 10-325 MG tablet Take 1 tablet by mouth every 6 (six) hours as needed for pain.   Past Week at Unknown time  . pantoprazole (PROTONIX) 40 MG tablet Take 1 tablet (40 mg total) by mouth 2 (two) times daily. 60 tablet 1 Past Week at Unknown time    Assessment: 62 yo man to start heparin and coumadin for afib.  He was recently in hospital and required ~1800 units of heparin/hr.  He was not taking coumadin as prescribed.   Heparin level is therapeutic  at 0.33 this evening. No bleeding noted.  Goal of Therapy:  INR 2-3 Heparin level 0.3-0.7 units/ml Monitor platelets by anticoagulation protocol: Yes   Plan:  Continue Heparin 1850 units/hr Check confirmatory heparin level in the morning  Daily heparin level Monitor for bleeding complications   Thank you for allowing [place] to participate in this patients care.   Jens Som, PharmD Please utilize Amion (under [place]) for appropriate number for your unit pharmacist. 01/27/2018 9:22 PM

## 2018-01-27 NOTE — ED Notes (Signed)
Pt off unit to [facility] via [facility] at this time

## 2018-01-27 NOTE — Progress Notes (Signed)
ANTIBIOTIC CONSULT NOTE-Preliminary  Pharmacy Consult for Vancomycin Indication: Discitis  No Known Allergies  Patient Measurements: Weight: 198 lb 10.2 oz (90.1 kg)  Vital Signs: Temp: 100.8 F (38.2 C) (10/15 2200) BP: 148/115 (10/16 0030) Pulse Rate: 126 (10/15 2330)  Labs: Recent Labs    01/26/18 2247  WBC 10.6*  HGB 9.9*  PLT 262  CREATININE 4.51*    Estimated Creatinine Clearance: 21.1 mL/min (A) (by C-G formula based on SCr of 4.51 mg/dL (H)).  No results for input(s): VANCOTROUGH, VANCOPEAK, VANCORANDOM, GENTTROUGH, GENTPEAK, GENTRANDOM, TOBRATROUGH, TOBRAPEAK, TOBRARND, AMIKACINPEAK, AMIKACINTROU, AMIKACIN in the last 72 hours.   Microbiology: Recent Results (from the past 720 hour(s))  Culture, blood (Routine X 2) w Reflex to ID Panel     Status: None   Collection Time: 01/08/18  6:55 PM  Result Value Ref Range Status   Specimen Description BLOOD RIGHT FOREARM  Final   Special Requests   Final    BOTTLES DRAWN AEROBIC AND ANAEROBIC Blood Culture results may not be optimal due to an inadequate volume of blood received in culture bottles   Culture   Final    NO GROWTH 5 DAYS Performed at [facility], [place] [address] [place], [place] [address]    Report Status 01/13/2018 FINAL  Final  Culture, blood (routine x 2)     Status: None   Collection Time: 01/09/18  3:25 PM  Result Value Ref Range Status   Specimen Description RIGHT ANTECUBITAL  Final   Special Requests   Final    BOTTLES DRAWN AEROBIC AND ANAEROBIC Blood Culture adequate volume   Culture   Final    NO GROWTH 5 DAYS Performed at [facility], [address], [place], [place] [address]    Report Status 01/14/2018 FINAL  Final  Culture, blood (routine x 2)     Status: None   Collection Time: 01/09/18  3:49 PM  Result Value Ref Range Status   Specimen Description BLOOD RIGHT ARM  Final   Special Requests   Final    BOTTLES DRAWN AEROBIC AND ANAEROBIC Blood Culture adequate volume   Culture   Final    NO GROWTH 5 DAYS Performed at [facility], [address], [place], [place] [address]    Report Status 01/14/2018 FINAL  Final  MRSA PCR Screening     Status: None   Collection Time: 01/09/18  8:55 PM  Result Value Ref Range Status   MRSA by PCR NEGATIVE NEGATIVE Final    Comment:        The GeneXpert MRSA Assay (FDA approved for NASAL specimens only), is one component of a comprehensive MRSA colonization surveillance program. It is not intended to diagnose MRSA infection nor to guide or monitor treatment for MRSA infections. Performed at [facility], [place] [address], [place], [place] [address]   Aerobic/Anaerobic Culture (surgical/deep wound)     Status: Abnormal   Collection Time: 01/10/18 11:17 AM  Result Value Ref Range Status   Specimen Description ABSCESS  Final   Special Requests NONE  Final   Gram Stain   Final    RARE WBC PRESENT,BOTH PMN AND MONONUCLEAR NO ORGANISMS SEEN    Culture (A)  Final    BACILLUS SPECIES NO ANAEROBES ISOLATED Performed at [facility], [place] [address], [place], [place] [address]    Report Status 01/15/2018 FINAL  Final  Culture, blood (routine x 2)     Status: None (Preliminary result)  Collection Time: 01/26/18 10:47 PM  Result Value Ref Range Status   Specimen Description BLOOD RIGHT ARM  Final   Special Requests   Final    BOTTLES DRAWN AEROBIC AND ANAEROBIC ANA BCAV AEB [place] Performed at [facility], [address] [place], [place] [address]    Culture PENDING  Incomplete   Report Status PENDING  Incomplete  Culture, blood (routine x 2)     Status: None (Preliminary result)   Collection Time: 01/26/18 10:47 PM  Result Value Ref Range Status   Specimen Description BLOOD RIGHT ARM  Final   Special Requests   Final    BOTTLES DRAWN AEROBIC AND ANAEROBIC ANA BCAV AEB [place] Performed at [facility], [address], [place], [place] [address]    Culture PENDING  Incomplete   Report Status PENDING   Incomplete    Medical History: Past Medical History:  Diagnosis Date  . Anxiety   . Arthritis    knees , back , shoulders (10/12/2017)  . Atrial fibrillation ([place])   . CAD (coronary artery disease)    Mild nonobstructive disease at cardiac catheterization 2002  . Chronic back pain    "back of my neck; down thru my legs" (10/12/2017)  . COPD (chronic obstructive pulmonary disease) ([place])   . Diverticulitis   . Esophageal reflux   . ESRD (end stage renal disease) on dialysis [facility])    "TTS; [place]" (11/23/2017)  . Essential hypertension   . Hepatitis C    states he no longer has this  . History of kidney stones   . History of syncope   . Hyperlipidemia   . Jerking 09/23/2014  . Myocardial infarction ([place]) 02/2017   "light one" (10/12/2017)  . Non-compliant behavior    non compliant with diaylsis per daughter  . PAT (paroxysmal atrial tachycardia) ([place])   . Peripheral arterial disease ([place])    Occluded left superficial femoral artery status post stent June 2016 - Dr. Trula Slade  . Pneumonia 1961  . Polycystic kidney, unspecified type   . Syncope 09/2014  . SYNCOPE 05/07/2010   Qualifier: Diagnosis of  By: Laurance Flatten, RN, BSN, Anderson Malta      Medications:  Ceftazidime 2 Gm IV x 1 dose ordered by the EDP  Assessment: 62 yo male seen in the ED for complaints of SOB and fever. Pt diagnosed with discitis and paraspinal abscess in late September at his previous hospital admission, with a referral for Abx management to ID. Pt was to be given vancomycin in conjunction with his MWF hemodialysis; however Pt left AMA and has not received dialysis in @3  weeks and reports he has not received any antibiotics since his hospital admission. Pharmacy has been consulted for vancomycin dosing.  Goal of Therapy:  Vancomycin peaks and troughs per hemodialysis protocol  Plan:  Preliminary review of pertinent patient information completed.  Protocol will be initiated with a loading dose of Vancomcyin 2000 mg IV   Forestine Na clinical pharmacist will complete review during morning rounds to assess patient and finalize treatment regimen if needed.  Norberto Sorenson, [facility] 01/27/2018,12:54 AM

## 2018-01-27 NOTE — Consult Note (Signed)
[facility] for Infectious Disease    Date of Admission:  01/26/2018     Total days of antibiotics 1               Reason for Consult: Discitis   Referring Provider: Reesa Chew Primary Care Provider: [facility]   Assessment/Plan:  Aaron [name] is a 62 year old male with previous medical history of ESRD and recent diagnosis of discitis and paraspinal abscess who left the hospital AMA on 01/13/18. Previous cultures grew Bacillus and has a recent history of Serratia and Enterococcus bacteremia. Previous TEE without vegetation. Now has returned with increasing back pain, fever, shortness of breath and weakness. Unfortunately he has not received antibiotics nor dialysis since leaving the hospital. He has been started on vancomycin and cefazolin. Shortness of breath likely associated with fluid overload. Awaiting dialysis orders and requesting pain medication.  1. Agree with vancomycin and ceftazidime for discitis.  2. Monitor cultures, fever curve and WBC count.   Principal Problem:   Sepsis due to undetermined organism [facility]) Active Problems:   UNSPECIFIED IRON DEFICIENCY ANEMIA   Essential hypertension   Esophageal reflux   Tobacco abuse   PAD (peripheral artery disease) (H[facility])   Polycystic kidney disease   AF (paroxysmal atrial fibrillation) (H[facility])   Hyperlipidemia   Paraspinal abscess ([place])   . budesonide (PULMICORT) nebulizer solution  0.5 mg Nebulization BID  . [START ON 01/28/2018] calcitRIOL  0.25 mcg Oral Q T,Th,Sa-HD  . calcium acetate  667 mg Oral QHS  . carvedilol  6.25 mg Oral BID WC  . clopidogrel  75 mg Oral Daily  . feeding supplement (NEPRO CARB STEADY)  237 mL Oral BID BM  . fluticasone furoate-vilanterol  1 puff Inhalation QPM  . furosemide  40 mg Oral q morning - 10a  . ipratropium  0.5 mg Nebulization TID  . levalbuterol  0.63 mg Nebulization TID  . mouth rinse  15 mL Mouth Rinse BID  . pantoprazole  40 mg Oral BID  . Warfarin -  Pharmacist Dosing Inpatient   Does not apply q1800     HPI: Aaron [name] is a 62 y.o. male with previous medical history of ESRD on dialysis, Hepatitis C, atrial fibrillation and most recently with lumbar discitis and paraspinal abscess with aspiration cultures showing Bacillus with previous history of Serratia and Enterococcus bacteremia. He was planned for 6 weeks of Ceftazidime and Vancomycin with HD. He left AMA from his last hospitalization on 01/13/18. His scheduled end date was 02/21/18.   Presented to the ED on 10/15 with shortness of breath, somnolence and continued back pain. He had not received dialysis since leaving the hospital due to unhappiness with his dialysis center and has not received antibiotics nor anticoagulants. Found to be mildly febrile with temperature of 100.8. BNP elevated at 600 and WBC count of 10.6. Chest x-ray with no cardiopulmonary disease. Blood cultures obtained are without growth in <24 hours. Started on vancomycin and cefazolin.   Interval history:  Aaron [name] has been having increased levels of pain located in his back with weakness and tingling located in his bilateral lower extremities. The severity of the symptoms has resulted in him having to use a cane. He has not been back to dialysis since leaving the hospital. Has not had fevers or chills in the past couple of days. Denies any bowel dysfunction.   Review of Systems: Review of Systems  Constitutional: Positive for chills, fever and malaise/fatigue. Negative  for weight loss.  Respiratory: Positive for cough and shortness of breath. Negative for sputum production and wheezing.   Cardiovascular: Negative for chest pain.  Gastrointestinal: Negative for abdominal pain, constipation, diarrhea, nausea and vomiting.  Skin: Negative for rash.  Neurological: Positive for weakness.     Past Medical History:  Diagnosis Date  . Anxiety   . Arthritis    knees , back , shoulders (10/12/2017)  . Atrial  fibrillation ([place])   . CAD (coronary artery disease)    Mild nonobstructive disease at cardiac catheterization 2002  . Chronic back pain    "back of my neck; down thru my legs" (10/12/2017)  . COPD (chronic obstructive pulmonary disease) ([place])   . Diverticulitis   . Esophageal reflux   . ESRD (end stage renal disease) on dialysis [facility])    "TTS; [place]" (11/23/2017)  . Essential hypertension   . Hepatitis C    states he no longer has this  . History of kidney stones   . History of syncope   . Hyperlipidemia   . Jerking 09/23/2014  . Myocardial infarction ([place]) 02/2017   "light one" (10/12/2017)  . Non-compliant behavior    non compliant with diaylsis per daughter  . PAT (paroxysmal atrial tachycardia) ([place])   . Peripheral arterial disease ([place])    Occluded left superficial femoral artery status post stent June 2016 - Dr. Trula Slade  . Pneumonia 1961  . Polycystic kidney, unspecified type   . Syncope 09/2014  . SYNCOPE 05/07/2010   Qualifier: Diagnosis of  By: Laurance Flatten RN, BSN, Anderson Malta      Social History   Tobacco Use  . Smoking status: Current Every Day Smoker    Packs/day: 2.00    Years: 47.00    Pack years: 94.00    Types: Cigarettes    Last attempt to quit: 08/03/2017    Years since quitting: 0.4  . Smokeless tobacco: Never Used  Substance Use Topics  . Alcohol use: Not Currently    Comment: 10/12/2017 alcohol free since 2017,  heavy drinker in the past  . Drug use: Not Currently    Comment: "<2003 whatever was around; nothing since"    Family History  Problem Relation Age of Onset  . Alcoholism Mother   . Heart disease Father        Massive heart attack  . Heart attack Father   . Atrial fibrillation Father   . Colon cancer Father   . Colon cancer Maternal Grandfather 67  . Alcoholism Maternal Grandfather   . Renal cancer Cousin   . Ovarian cancer Sister     No Known Allergies  OBJECTIVE: Blood pressure 116/83, pulse 82, temperature 98.8 F (37.1 C), resp. rate  18, weight 85 kg, SpO2 97 %.  Physical Exam  Constitutional: He is oriented to person, place, and time. He appears well-developed and well-nourished. No distress.  Cardiovascular: Normal rate, regular rhythm, normal heart sounds and intact distal pulses.  2+ pitting edema located in left lower extremity; right appears with less edema.   Pulmonary/Chest: Effort normal and breath sounds normal.  Neurological: He is alert and oriented to person, place, and time.  Skin: Skin is warm and dry.  Psychiatric: He has a normal mood and affect. His behavior is normal. Judgment and thought content normal.    Lab Results Lab Results  Component Value Date   WBC 8.5 01/27/2018   HGB 9.0 (L) 01/27/2018   HCT 28.6 (L) 01/27/2018   MCV 96.3 01/27/2018  PLT 245 01/27/2018    Lab Results  Component Value Date   CREATININE 4.51 (H) 01/26/2018   BUN 42 (H) 01/26/2018   NA 133 (L) 01/26/2018   K 4.6 01/26/2018   CL 103 01/26/2018   CO2 20 (L) 01/26/2018    Lab Results  Component Value Date   ALT 9 01/26/2018   AST 15 01/26/2018   ALKPHOS 130 (H) 01/26/2018   BILITOT 0.6 01/26/2018     Microbiology: Recent Results (from the past 240 hour(s))  Culture, blood (routine x 2)     Status: None (Preliminary result)   Collection Time: 01/26/18 10:47 PM  Result Value Ref Range Status   Specimen Description BLOOD RIGHT ARM  Final   Special Requests   Final    BOTTLES DRAWN AEROBIC AND ANAEROBIC ANA BCAV AEB [place]   Culture   Final    NO GROWTH < 12 HOURS Performed at [facility], [address] [place], [place] [address]    Report Status PENDING  Incomplete  Culture, blood (routine x 2)     Status: None (Preliminary result)   Collection Time: 01/26/18 10:47 PM  Result Value Ref Range Status   Specimen Description BLOOD RIGHT ARM  Final   Special Requests   Final    BOTTLES DRAWN AEROBIC AND ANAEROBIC ANA BCAV AEB [place]   Culture   Final    NO GROWTH < 12 HOURS Performed at [facility], [address], [place], [place] [address]    Report Status PENDING  Incomplete     Terri Piedra, NP [facility] [phone] Pager  01/27/2018  8:33 AM

## 2018-01-27 NOTE — Progress Notes (Signed)
New Admission Note:  Arrival Method: EMS Mental Orientation: Alert and oriented x 3-4 Telemetry: Box 07  Afib Assessment: Completed Skin: Warm and dry. Edema BLE IV: NSL Pain: 10/10 Lower back Tubes: N/A Safety Measures: Safety Fall Prevention Plan initiated.  Admission: Completed 5 M  Orientation: Patient has been orientated to the room, unit and the staff. Welcome booklet given.  Family: None  Orders have been reviewed and implemented. Will continue to monitor the patient. Call light has been placed within reach and bed alarm has been activated.   Sima Matas BSN, RN  Phone Number: [phone]

## 2018-01-27 NOTE — Consult Note (Signed)
Renal Service Consult Note [place] 01/27/2018 Sol Blazing Requesting Physician:  Dr Reesa Chew  Reason for Consult:  ESRD pt w/  HPI: The patient is a 62 y.o. year-old came to ED for SOB and "jerking" of extremities.  Asked to see for ESRD.  Claims he got "pissed off" by his doctors in [place] and in [place] and stopped going to dialysis. Has missed 2-3 weeks of dialysis.  Creat is 4.6 here. No hx cirrhosis, is on long-term narcotics for back problems. Was dx'd w/ paraspinal abscess earlier this month, aspirated w/ +bacillus growing.  Has missed most of his abx which were supposed to be given w/ HD. CXR here is clear.  NH3 drawn and it is normal , tbili and LFT's also normal.  Pt says he makes "lots of urine" and doesn't think he needs dialysis.   Appetite and energy are good, is active "all the time" at home.  No cough or orthopnea.    EChart  12/2016 - AKI, hep C, COPD, tobacco  04/2017 - duodenitis, CKD IV, PAF , ICM 40%  08/2017 - R leg revasc per [facility] (3 stents, endarterectomy), wound vac  08/2017 - anasarca/ CKD V > new ESRD, started HD, ADPKD  09/2017 - wound hematoma in groin  09/2017 - abd wall hematoma  10/2017 - I&D groin and abd wall hematoma, ABx beads , +serratia from cx's.   11/2017 - L arm AVF hematoma/ bleed  12/2017 - recur L arm AVF/ bleeding/ infection > to OR I&D, washout,ligation of AVF+purulence in OR  01/2018 - paraspinal abscess , aspirated +bacillus, IV abx at HD, pt left AMA   ROS  denies CP  no joint pain   no HA  no blurry vision  no rash  no diarrhea  no nausea/ vomiting    Past Medical History  Past Medical History:  Diagnosis Date  . Anxiety   . Arthritis    knees , back , shoulders (10/12/2017)  . Atrial fibrillation ([place])   . CAD (coronary artery disease)    Mild nonobstructive disease at cardiac catheterization 2002  . Chronic back pain    "back of my neck; down thru my legs" (10/12/2017)  . COPD (chronic obstructive  pulmonary disease) ([place])   . Diverticulitis   . Esophageal reflux   . ESRD (end stage renal disease) on dialysis [facility])    "TTS; [facility]" (11/23/2017)  . Essential hypertension   . Hepatitis C    states he no longer has this  . History of kidney stones   . History of syncope   . Hyperlipidemia   . Jerking 09/23/2014  . Myocardial infarction ([place]) 02/2017   "light one" (10/12/2017)  . Non-compliant behavior    non compliant with diaylsis per daughter  . PAT (paroxysmal atrial tachycardia) ([place])   . Peripheral arterial disease ([place])    Occluded left superficial femoral artery status post stent June 2016 - Dr. Trula Slade  . Pneumonia 1961  . Polycystic kidney, unspecified type   . Syncope 09/2014  . SYNCOPE 05/07/2010   Qualifier: Diagnosis of  By: Laurance Flatten RN, BSN, Anderson Malta     Past Surgical History  Past Surgical History:  Procedure Laterality Date  . ABDOMINAL AORTOGRAM N/A 08/11/2017   Procedure: ABDOMINAL AORTOGRAM;  Surgeon: Serafina Mitchell, MD;  Location: [place] [facility];  Service: Cardiovascular;  Laterality: N/A;  . ANTERIOR CERVICAL DECOMP/DISCECTOMY FUSION    . APPLICATION OF WOUND VAC Right 08/14/2017  Procedure: APPLICATION OF PREVENA INCISIONAL WOUND VAC RIGHT GROIN;  Surgeon: Serafina Mitchell, MD;  Location[facility] OR;  Service: Vascular;  Laterality: Right;  . AV FISTULA PLACEMENT Left 09/04/2017   Procedure: ARTERIOVENOUS (AV) FISTULA CREATION LEFT UPPER ARM;  Surgeon: Serafina Mitchell, MD;  Location: [place];  Service: Vascular;  Laterality: Left;  . BACK SURGERY    . BASCILIC VEIN TRANSPOSITION Left 11/20/2017   Procedure: SECOND STAGE BASILIC VEIN TRANSPOSITION LEFT ARM;  Surgeon: Serafina Mitchell, MD;  Location: [place];  Service: Vascular;  Laterality: Left;  . BIOPSY  12/17/2016   Procedure: BIOPSY;  Surgeon: Daneil Dolin, MD;  [facility];  Service: Gastroenterology;;  gastric colon  . BIOPSY  04/29/2017   Procedure: BIOPSY;  Surgeon: Daneil Dolin, MD;  [facility];  Service: Endoscopy;;  duodenal biopsies  . BUNIONECTOMY Bilateral   . COLONOSCOPY  2008   Dr. Oneida Alar: rare sigmoid colon diverticulosis, internal hemorrhoids.   . COLONOSCOPY WITH PROPOFOL N/A 12/17/2016   dense left-sided diverticulosis, right colon ulcers s/p biopsy query occult NSAID use vs transient ischemia, not consistent with IBD. CMV stains negative.   Marland Kitchen ENDARTERECTOMY FEMORAL Right 08/14/2017   Procedure: RIGHT ILLIO-FEMORAL ENDARTERECTOMY;  Surgeon: Serafina Mitchell, MD;  Location: [facility] OR;  Service: Vascular;  Laterality: Right;  . ESOPHAGEAL DILATION  12/17/2016   EGD with mild Schatzki's ring s/p dilatation, small hiatal hernia, erosive gastropathy (negative H.pylori gastritis)  . ESOPHAGOGASTRODUODENOSCOPY  2008   Dr. Oneida Alar: normal esophagus without Barrett's, antritis and duodenitis, path with H.pylori gastritis  . ESOPHAGOGASTRODUODENOSCOPY (EGD) WITH PROPOFOL N/A 12/17/2016   Procedure: ESOPHAGOGASTRODUODENOSCOPY (EGD) WITH PROPOFOL;  Surgeon: Daneil Dolin, MD;  [facility];  Service: Gastroenterology;  Laterality: N/A;  . ESOPHAGOGASTRODUODENOSCOPY (EGD) WITH PROPOFOL N/A 04/29/2017   Patchy erythema of gastric mucosa diffusely, extensive inflammatory changes in duodenum, geographic ulceration and mucosal edema present, encroaching somewhat on the lumen yet still widely patent, distal second portion of duodenum appeared abnormal, path with peptic duodenitis with ulceration  . GROIN DEBRIDEMENT Right 10/14/2017   Procedure: RIGHT GROIN AND RIGHT LOWER QUADRANT ABDOMEN DEBRIDEMENT WITH PLACEMENT OF ANTIBIOTIC BEADS;  Surgeon: Serafina Mitchell, MD;  Location: [place];  Service: Vascular;  Laterality: Right;  . HEMATOMA EVACUATION Left 12/06/2017   Procedure: EVACUATION HEMATOMA;  Surgeon: Waynetta Sandy, MD;  Location: [place];  Service: Vascular;  Laterality: Left;  . HERNIA REPAIR  1962   umbilical  . I&D EXTREMITY Right 09/21/2017   Procedure:  IRRIGATION AND DEBRIDEMENT GROIN;  Surgeon: Elam Dutch, MD;  Location: [facility] OR;  Service: Vascular;  Laterality: Right;  . I&D EXTREMITY Left 11/23/2017   Procedure: Evacuation Hematoma LEFT UPPER ARM GRAFT;  Surgeon: Elam Dutch, MD;  Location: [place];  Service: Vascular;  Laterality: Left;  . INGUINAL HERNIA REPAIR Right 02/2017   [place]  . INSERTION OF DIALYSIS CATHETER Right 09/04/2017   Procedure: INSERTION OF TUNNELED  DIALYSIS CATHETER - RIGHT INTERNAL JUGULAR PLACEMENT;  Surgeon: Serafina Mitchell, MD;  Location: [place];  Service: Vascular;  Laterality: Right;  . INSERTION OF ILIAC STENT Right 08/14/2017   Procedure: INSERTION OF RIGHT COMMON ILIAC STENT 63m x 870mx 130cm INSERTION OF RIGHT EXTERNAL ILIAC STENT 68m66m 76m51m130cm INSERTION OF SUPERFICIAL FERMORAL ARTERY STENT 7mm 268m0mm 84m0cm;  Surgeon: BrabhaSerafina Mitchell [facility];  Laterality: Right;  .  IR FLUORO GUIDE CV LINE RIGHT  08/31/2017  . IR US GUIDE VASC ACCESS RIGHT  08/31/2017  . LOWER EXTREMITY ANGIOGRAPHY Right 08/11/2017   Procedure: LOWER EXTREMITY ANGIOGRAPHY;  Surgeon: Serafina Mitchell, MD;  Location: [place] CV LAB;  Service: Cardiovascular;  Laterality: Right;  . PATCH ANGIOPLASTY Right 08/14/2017   Procedure: PATCH ANGIOPLASTY USING HEMASHIELD PATCH 0.3IN Lillie Columbia;  Surgeon: Serafina Mitchell, MD;  Location: [facility] OR;  Service: Vascular;  Laterality: Right;  . PERIPHERAL VASCULAR CATHETERIZATION N/A 09/20/2014   Procedure: Abdominal Aortogram;  Surgeon: Serafina Mitchell, MD;  Location: [place] CV LAB;  Service: Cardiovascular;  Laterality: N/A;  . POSTERIOR FUSION LUMBAR SPINE    . TEE WITHOUT CARDIOVERSION N/A 01/12/2018   Procedure: TRANSESOPHAGEAL ECHOCARDIOGRAM (TEE);  Surgeon: Lelon Perla, MD;  Location: [facility] ENDOSCOPY;  Service: Cardiovascular;  Laterality: N/A;   Family History  Family History  Problem Relation Age of Onset  . Alcoholism Mother   . Heart disease Father         Massive heart attack  . Heart attack Father   . Atrial fibrillation Father   . Colon cancer Father   . Colon cancer Maternal Grandfather 26  . Alcoholism Maternal Grandfather   . Renal cancer Cousin   . Ovarian cancer Sister    Social History  reports that he has been smoking cigarettes. He has a 94.00 pack-year smoking history. He has never used smokeless tobacco. He reports that he drank alcohol. He reports that he has current or past drug history. Allergies No Known Allergies Home medications Prior to Admission medications   Medication Sig Start Date End Date Taking? Authorizing Provider  B Complex-C-Folic Acid (DIALYVITE 932) 0.8 MG TABS Take 1 tablet by mouth daily. 09/15/17  Yes [provider]  BREO ELLIPTA 200-25 MCG/INH AEPB Inhale 1 puff into the lungs every evening.  07/27/17  Yes [provider]  calcitRIOL (ROCALTROL) 0.25 MCG capsule Take 1 capsule (0.25 mcg total) by mouth Every Tuesday,Thursday,and Saturday with dialysis. 09/08/17  Yes Ghimire, Henreitta Leber, MD  calcium acetate (PHOSLO) 667 MG capsule Take 667 mg by mouth at bedtime. Prescribed three times daily with meals 09/15/17  Yes [provider]  carvedilol (COREG) 25 MG tablet Take 1 tablet (25 mg total) 2 (two) times daily by mouth. Patient taking differently: Take 25 mg by mouth at bedtime. Prescribed one tablet twice daily 02/25/17 01/27/26 Yes Imogene Burn, PA-C  cholecalciferol (VITAMIN D) 1000 units tablet Take 2,000 Units by mouth daily.   Yes [provider]  clopidogrel (PLAVIX) 75 MG tablet Take 75 mg by mouth daily.  12/24/17  Yes [provider]  furosemide (LASIX) 40 MG tablet Take 40 mg by mouth daily.  12/23/17  Yes [provider]  metoprolol succinate (TOPROL-XL) 25 MG 24 hr tablet Take 25 mg by mouth daily.  09/24/14  Yes [provider]  Nutritional Supplements (FEEDING SUPPLEMENT, NEPRO CARB STEADY,) LIQD Take 237 mLs by mouth 2 (two)  times daily between meals. 10/21/17  Yes Rhyne, Hulen Shouts, PA-C  oxyCODONE-acetaminophen (PERCOCET) 10-325 MG tablet Take 1 tablet by mouth every 6 (six) hours as needed for pain.   Yes [provider]  pantoprazole (PROTONIX) 40 MG tablet Take 1 tablet (40 mg total) by mouth 2 (two) times daily. 04/30/17 01/27/26 Yes Heath Lark D, DO   Liver Function Tests Recent Labs  Lab 01/26/18 2247  AST 15  ALT 9  ALKPHOS 130*  BILITOT 0.6  PROT 7.9  ALBUMIN 3.2*   No results for input(s): LIPASE, AMYLASE in the last 168 hours. CBC Recent Labs  Lab 01/26/18 2247 01/27/18 0415  WBC 10.6* 8.5  NEUTROABS 9.0* 6.6  HGB 9.9* 9.0*  HCT 32.4* 28.6*  MCV 98.5 96.3  PLT 262 445   Basic Metabolic Panel Recent Labs  Lab 01/26/18 2247  NA 133*  K 4.6  CL 103  CO2 20*  GLUCOSE 100*  BUN 42*  CREATININE 4.51*  CALCIUM 9.2   Iron/TIBC/Ferritin/ %Sat    Component Value Date/Time   IRON 45 08/31/2017 0427   TIBC 232 (L) 08/31/2017 0427   IRONPCTSAT 19 08/31/2017 0427    Vitals:   01/27/18 0456 01/27/18 0534 01/27/18 0837 01/27/18 0842  BP: 126/90 116/83    Pulse: 83 82 89   Resp: _0 Temp: 98.2 F (36.8 C) 98.8 F (37.1 C)    TempSrc: Oral     SpO2: 99% 97% 96% 96%  Weight:       Exam Gen chron ill, skin is darkened, slight yellowing No rash, cyanosis or gangrene Sclera anicteric, throat clear No jvd or bruits Chest clear bilat to base Cor irreg no mrg Abd soft ntnd no mass or ascites +bs GU normal male MS no joint effusions or deformity Ext 1-2+ bilat ankle edema, no wounds or ulcers Neuro is alert, Ox 3 , nf, +myoclonic jerking/ +asterixis   CXR - no active disease   Home meds:  - carvedilol 25 bid/ furosemide 40 qd/ metoprolol succinate 25 qd  - clopidogrel 75 qd/ vitamins/ prns  - fluticasone- vilanterol hs/ pantoprazole 40 bid/ oxy -acetaminophen prn/ calc acetate tid ac   Dialysis: [facility] MWF  4h   79.5kg (hasn't met [facility] in a long  time)  2/2.5 bath  TDC  Hep none        Impression/ Plan: 1. CKD V/ ESRD - pt not sure he needs HD, voids normal amts of urine per patient and I have seen him produce 1 liter urine already today. Also creat is only 4 after 2 weeks w/ no dialysis. However he clearly has myoclonic jerking and NH3 is normal and pt not on gabapentin.  Narcotics or uremia most likely cause.  We have agreed to a trial of dialysis (thurs/ Friday) to see if jerking is relieved in which case he does need dialysis, if not improving then we will discuss further possibly coming off HD w/ Dr Hinda Lenis.    2. HTN - cont meds 3. COPD 4. AFib  5. Anemia ckd - Hb 9.3     Kelly Splinter MD [place] pager [phone]   01/27/2018, 1:04 PM

## 2018-01-27 NOTE — Progress Notes (Signed)
PROGRESS NOTE    Aaron [name]  SEG:[id number] DOB: 03/27/1956 DOA: 01/26/2018 PCP: Gwenlyn Saran, [place]   Brief Narrative:  62 year old with a history of anxiety, osteoarthritis, atrial fibrillation, CAD, chronic back pain, COPD, ESRD on hemodialysis, GERD, hepatitis C, essential hypertension, PAD, medical noncompliance came to the ER with complaints of difficulty breathing and generalized weakness.  Patient left AMA from [facility] 2 weeks ago after being diagnosed in partially treated for his paraspinal abscess.  Patient states he has not been dialyzed since then.  Upon admission his creatinine was 4.5, BUN 42.  His chest x-ray did not show any acute pulmonary edema and his breathing status was okay.  He was noted to have low-grade temperature.   Assessment & Plan:   Principal Problem:   Sepsis due to undetermined organism [facility]) Active Problems:   UNSPECIFIED IRON DEFICIENCY ANEMIA   Essential hypertension   Esophageal reflux   Tobacco abuse   PAD (peripheral artery disease) ([facility])   Polycystic kidney disease   AF (paroxysmal atrial fibrillation) ([facility])   Hyperlipidemia   Paraspinal abscess ([facility])   Lumbar discitis   Sepsis likely secondary to paraspinal abscess status post drainage Lumbar discitis Medical noncompliance - Spoke with infectious disease this morning, Dr. Graylon Good.  Appreciate input-advised to continue vancomycin and ceftaz.  Will help determine further course. -Pain control. -Advised about the importance about remaining medically compliant  Elevated troponin - Secondary to slight demand ischemia in the setting of fluid overload and ESRD.  No chest pain.  No signs of active ischemia at this time.  ESRD on hemodialysis - Noncompliant, he has not been dialyzed in several days.  Yesterday his potassium was noted to be 4.6, BUN 42, bicarb 20. -I have consulted nephrology this morning will see the patient and arrange for dialysis.  Anemia, iron  deficiency and also chronic disease -I do not have iron studies available at this time.  Will closely monitor hemoglobin.  No active signs of blood loss.  May benefit from Depo during his dialysis.  Essential hypertension - Continue Coreg 6.25 mg twice daily  GERD -Protonix 40 mg twice daily  Peripheral arterial disease -Continue Plavix 75 mg daily, not sure why he is not on statin.  Atrial fibrillation, paroxysmal Atrial fibrillation with RVR, one episode.  Treated with Lopressor 5 mg IV once -Continue Coreg 6.25 mg twice daily.  Coumadin with bridge started by the admitting provider.  Will closely monitor this for now.  Doubt patient will remain compliant with any other anticoagulation.  Hyperlipidemia -Not on statin.  She will follow-up outpatient with primary care provider and be placed on this.   DVT prophylaxis: Heparin drip to Coumadin Code Status: Full code Family Communication: None at bedside Disposition Plan: Patient needs to remain inpatient for further dialysis and IV antibiotic treatment for his lumbar discitis.  Infectious disease has been consulted to form for further plan in terms of his antibiotics and determine the length of treatment.  Consultants:   Infectious disease  Procedures:   None  Antimicrobials:   Vancomycin  Ceftazidime   Subjective: Shortness of breath is slightly improved but still has quite a bit of exertional shortness of breath.  Still reports some low back pain.  States he did not take his medications and left AMA last time because he was angry but he would not elaborate why he was angry.  Heart rate went up to 130s this morning therefore 5 mg of IV Lopressor was given by  me once which improved this and probably down to range of 80s  Review of Systems Otherwise negative except as per HPI, including: General: Denies fever, chills, night sweats or unintended weight loss. Resp: Denies cough, wheezing Cardiac: Denies chest pain,  palpitations, orthopnea, paroxysmal nocturnal dyspnea. GI: Denies abdominal pain, nausea, vomiting, diarrhea or constipation GU: Denies dysuria, frequency, hesitancy or incontinence MS: Denies muscle aches, joint pain or swelling Neuro: Denies headache, neurologic deficits (focal weakness, numbness, tingling), abnormal gait Psych: Denies anxiety, depression, SI/HI/AVH Skin: Denies new rashes or lesions ID: Denies sick contacts, exotic exposures, travel  Objective: Vitals:   01/27/18 0456 01/27/18 0534 01/27/18 0837 01/27/18 0842  BP: 126/90 116/83    Pulse: 83 82 89   Resp: 20 18 18    Temp: 98.2 F (36.8 C) 98.8 F (37.1 C)    TempSrc: Oral     SpO2: 99% 97% 96% 96%  Weight:        Intake/Output Summary (Last 24 hours) at 01/27/2018 1200 Last data filed at 01/27/2018 1118 Gross per 24 hour  Intake 677.53 ml  Output 950 ml  Net -272.47 ml   Filed Weights   01/27/18 0045 01/27/18 0251  Weight: 90.1 kg 85 kg    Examination:  General exam: Appears calm and comfortable  Respiratory system: Diminished breath sounds bilateral bases Cardiovascular system: S1 & S2 heard, RRR. No JVD, murmurs, rubs, gallops or clicks.  1+ bilateral lower extremity pitting edema Gastrointestinal system: Abdomen is nondistended, soft and nontender. No organomegaly or masses felt. Normal bowel sounds heard. Central nervous system: Alert and oriented. No focal neurological deficits. Extremities: Symmetric 5 x 5 power. Skin: No rashes, lesions or ulcers Psychiatry: Judgement and insight appear normal. Mood & affect appropriate.     Data Reviewed:   CBC: Recent Labs  Lab 01/26/18 2247 01/27/18 0415  WBC 10.6* 8.5  NEUTROABS 9.0* 6.6  HGB 9.9* 9.0*  HCT 32.4* 28.6*  MCV 98.5 96.3  PLT 262 242   Basic Metabolic Panel: Recent Labs  Lab 01/26/18 2247  NA 133*  K 4.6  CL 103  CO2 20*  GLUCOSE 100*  BUN 42*  CREATININE 4.51*  CALCIUM 9.2   GFR: Estimated Creatinine Clearance:  20.7 mL/min (A) (by C-G formula based on SCr of 4.51 mg/dL (H)). Liver Function Tests: Recent Labs  Lab 01/26/18 2247  AST 15  ALT 9  ALKPHOS 130*  BILITOT 0.6  PROT 7.9  ALBUMIN 3.2*   No results for input(s): LIPASE, AMYLASE in the last 168 hours. Recent Labs  Lab 01/26/18 2247  AMMONIA 13   Coagulation Profile: Recent Labs  Lab 01/27/18 0415  INR 1.35   Cardiac Enzymes: Recent Labs  Lab 01/26/18 2247 01/27/18 0725  TROPONINI 0.11* 0.12*   BNP (last 3 results) No results for input(s): PROBNP in the last 8760 hours. HbA1C: No results for input(s): HGBA1C in the last 72 hours. CBG: No results for input(s): GLUCAP in the last 168 hours. Lipid Profile: No results for input(s): CHOL, HDL, LDLCALC, TRIG, CHOLHDL, LDLDIRECT in the last 72 hours. Thyroid Function Tests: No results for input(s): TSH, T4TOTAL, FREET4, T3FREE, THYROIDAB in the last 72 hours. Anemia Panel: No results for input(s): VITAMINB12, FOLATE, FERRITIN, TIBC, IRON, RETICCTPCT in the last 72 hours. Sepsis Labs: No results for input(s): PROCALCITON, LATICACIDVEN in the last 168 hours.  Recent Results (from the past 240 hour(s))  Culture, blood (routine x 2)     Status: None (Preliminary result)   Collection  Time: 01/26/18 10:47 PM  Result Value Ref Range Status   Specimen Description BLOOD RIGHT ARM  Final   Special Requests   Final    BOTTLES DRAWN AEROBIC AND ANAEROBIC ANA BCAV AEB [place]   Culture   Final    NO GROWTH < 12 HOURS Performed at [facility], [address], [place], [place] [address]    Report Status PENDING  Incomplete  Culture, blood (routine x 2)     Status: None (Preliminary result)   Collection Time: 01/26/18 10:47 PM  Result Value Ref Range Status   Specimen Description BLOOD RIGHT ARM  Final   Special Requests   Final    BOTTLES DRAWN AEROBIC AND ANAEROBIC ANA BCAV AEB [place]   Culture   Final    NO GROWTH < 12 HOURS Performed at [facility], [address],  [place], [place] [address]    Report Status PENDING  Incomplete         Radiology Studies: Dg Chest 2 View  Result Date: 01/26/2018 CLINICAL DATA:  Shortness of breath EXAM: CHEST - 2 VIEW COMPARISON:  01/09/2018, 12/04/2017 FINDINGS: Right-sided central venous catheter tip over the SVC. Bibasilar atelectasis or scarring. No pleural effusion. No focal consolidation. Stable cardiomediastinal silhouette with aortic atherosclerosis. No pneumothorax. IMPRESSION: No active cardiopulmonary disease. Stable scarring and/or atelectasis at both lung bases. Electronically Signed   By: Donavan Foil M.D.   On: 01/26/2018 23:23        Scheduled Meds: . budesonide (PULMICORT) nebulizer solution  0.5 mg Nebulization BID  . [START ON 01/28/2018] calcitRIOL  0.25 mcg Oral Q T,Th,Sa-HD  . calcium acetate  667 mg Oral QHS  . carvedilol  6.25 mg Oral BID WC  . clopidogrel  75 mg Oral Daily  . feeding supplement (NEPRO CARB STEADY)  237 mL Oral BID BM  . fluticasone furoate-vilanterol  1 puff Inhalation QPM  . furosemide  40 mg Oral q morning - 10a  . ipratropium  0.5 mg Nebulization TID  . levalbuterol  0.63 mg Nebulization TID  . mouth rinse  15 mL Mouth Rinse BID  . pantoprazole  40 mg Oral BID  . Warfarin - Pharmacist Dosing Inpatient   Does not apply q1800   Continuous Infusions: . heparin 1,600 Units/hr (01/27/18 0344)     LOS: 0 days   Time spent= 40 mins    Luken Shadowens Arsenio Loader, MD [facility] Pager [phone]   If 7PM-7AM, please contact night-coverage [email or web address] Password [email or web address] 01/27/2018, 12:00 PM

## 2018-01-27 NOTE — Progress Notes (Signed)
ANTICOAGULATION CONSULT NOTE - Initial Consult  Pharmacy Consult for heparin and coumadin Indication: atrial fibrillation  No Known Allergies  Patient Measurements: Weight: 187 lb 6.3 oz (85 kg)  Vital Signs: Temp: 98.8 F (37.1 C) (10/16 0534) Temp Source: Oral (10/16 0456) BP: 116/83 (10/16 0534) Pulse Rate: 82 (10/16 1330)  Labs: Recent Labs    01/26/18 2247 01/27/18 0415 01/27/18 0725 01/27/18 1249  HGB 9.9* 9.0*  --   --   HCT 32.4* 28.6*  --   --   PLT 262 245  --   --   LABPROT  --  16.5*  --   --   INR  --  1.35  --   --   HEPARINUNFRC  --   --   --  0.18*  CREATININE 4.51*  --   --   --   TROPONINI 0.11*  --  0.12*  --     Estimated Creatinine Clearance: 20.7 mL/min (A) (by C-G formula based on SCr of 4.51 mg/dL (H)).   Medical History: Past Medical History:  Diagnosis Date  . Anxiety   . Arthritis    knees , back , shoulders (10/12/2017)  . Atrial fibrillation ([place])   . CAD (coronary artery disease)    Mild nonobstructive disease at cardiac catheterization 2002  . Chronic back pain    "back of my neck; down thru my legs" (10/12/2017)  . COPD (chronic obstructive pulmonary disease) ([place])   . Diverticulitis   . Esophageal reflux   . ESRD (end stage renal disease) on dialysis [facility])    "TTS; [place]" (11/23/2017)  . Essential hypertension   . Hepatitis C    states he no longer has this  . History of kidney stones   . History of syncope   . Hyperlipidemia   . Jerking 09/23/2014  . Myocardial infarction ([place]) 02/2017   "light one" (10/12/2017)  . Non-compliant behavior    non compliant with diaylsis per daughter  . PAT (paroxysmal atrial tachycardia) ([place])   . Peripheral arterial disease ([place])    Occluded left superficial femoral artery status post stent June 2016 - Dr. Trula Slade  . Pneumonia 1961  . Polycystic kidney, unspecified type   . Syncope 09/2014  . SYNCOPE 05/07/2010   Qualifier: Diagnosis of  By: Laurance Flatten, RN, BSN, Anderson Malta      Medications:   Medications Prior to Admission  Medication Sig Dispense Refill Last Dose  . B Complex-C-Folic Acid (DIALYVITE 993) 0.8 MG TABS Take 1 tablet by mouth daily.  3 Past Week at Unknown time  . BREO ELLIPTA 200-25 MCG/INH AEPB Inhale 1 puff into the lungs every evening.    Past Week at Unknown time  . calcitRIOL (ROCALTROL) 0.25 MCG capsule Take 1 capsule (0.25 mcg total) by mouth Every Tuesday,Thursday,and Saturday with dialysis. 30 capsule 0 Past Week at Unknown time  . calcium acetate (PHOSLO) 667 MG capsule Take 667 mg by mouth at bedtime. Prescribed three times daily with meals  3 Past Week at Unknown time  . carvedilol (COREG) 25 MG tablet Take 1 tablet (25 mg total) 2 (two) times daily by mouth. (Patient taking differently: Take 25 mg by mouth at bedtime. Prescribed one tablet twice daily) 180 tablet 3 Past Week at Unknown time  . cholecalciferol (VITAMIN D) 1000 units tablet Take 2,000 Units by mouth daily.   Past Week at Unknown time  . clopidogrel (PLAVIX) 75 MG tablet Take 75 mg by mouth daily.    Past Week  at Unknown time  . furosemide (LASIX) 40 MG tablet Take 40 mg by mouth daily.   5 Past Week at Unknown time  . metoprolol succinate (TOPROL-XL) 25 MG 24 hr tablet Take 25 mg by mouth daily.    Past Week at Unknown time  . Nutritional Supplements (FEEDING SUPPLEMENT, NEPRO CARB STEADY,) LIQD Take 237 mLs by mouth 2 (two) times daily between meals.  0 Past Week at Unknown time  . oxyCODONE-acetaminophen (PERCOCET) 10-325 MG tablet Take 1 tablet by mouth every 6 (six) hours as needed for pain.   Past Week at Unknown time  . pantoprazole (PROTONIX) 40 MG tablet Take 1 tablet (40 mg total) by mouth 2 (two) times daily. 60 tablet 1 Past Week at Unknown time    Assessment: 62 yo man to start heparin and coumadin for afib.  He was recently in hospital and required ~1800 units of heparin/hr.  He was not taking coumadin as prescribed. Heparin level 0.18 this afternoon. Will increase heparin drip.  INR 1.35  Goal of Therapy:  INR 2-3 Heparin level 0.3-0.7 units/ml Monitor platelets by anticoagulation protocol: Yes   Plan:  Heparin bolus 2500 units and increase drip to 1850 units/hr Coumadin 5mg  po x 1 Check heparin level in 6 hours  Daily PT/INR/heparin level Monitor for bleeding complications  Adonys Wildes A. Levada Dy, PharmD, [place] Pager: [phone] Please utilize Amion for appropriate phone number to reach the unit pharmacist ([place])   01/27/2018,1:55 PM

## 2018-01-27 NOTE — H&P (Signed)
History and Physical    Aaron [name] QIO:[id number] DOB: 1956/04/11 DOA: 01/26/2018  PCP: [name]   Patient coming from: Home via EMS  Chief Complaint: Difficulty Breathing  HPI: Aaron [name] is a 62 y.o. male with medical history significant of Anxiety, arthritis, Atrial fibrillation, CAD, Chronic back pain, COPD, ESRD, Diverticulitis, Esophageal reflux, Hypertension, Hep C, PAD, Pneumonia, Syncope and Non-compliance who is seen in the ED tonight for difficulty breathing, and "weak and jerking arms and legs". He states that he has not gone to dialysis since approximately 01/13/18 when he left [name] because" they made me mad because they wouldn't give me my pain medicine". He has not attended dialysis at his Dialysis clinic either in spite of their calls to him because "they made me mad there too". He states that he has not taken any of his prescriptions in this time period either.  He started having fevers yesterday chills associated with rigors, fatigue and malaise.  He denies chest pain, palpitations, dizziness, diaphoresis, PND, but complains of orthopnea and pitting edema of the lower extremities.  No abdominal pain, nausea, emesis, diarrhea [name] constipation.  He denies melena [name] hematochezia.  He urinates at times, denies dysuria [name] gross hematuria.  ED Course: Initial vital signs temperature 100.8 F, pulse 126, respirations 21, blood pressure 153/111 mmHg, O2 sat 92% on room air.  White counts 10.6, hemoglobin 9.9 g/dL and platelets 262.  Ammonia was normal.  BNP was 600.0 pg/mL.  Troponin 0 0.11 ng/mL.  BUN was 42, creatinine 4.51 glucose 100 mg/dL.  Chest radiograph did not show any active cardiopulmonary disease.  Review of Systems: As per HPI otherwise 10 point review of systems negative.   Past Medical History:  Diagnosis Date  . Anxiety   . Arthritis    knees , back , shoulders (10/12/2017)  . Atrial fibrillation ([name])   . CAD (coronary  artery disease)    Mild nonobstructive disease at cardiac catheterization 2002  . Chronic back pain    "back of my neck; down thru my legs" (10/12/2017)  . COPD (chronic obstructive pulmonary disease) ([place])   . Diverticulitis   . Esophageal reflux   . ESRD (end stage renal disease) on dialysis [name])    "TTS; [name]" (11/23/2017)  . Essential hypertension   . Hepatitis C    states he no longer has this  . History of kidney stones   . History of syncope   . Hyperlipidemia   . Jerking 09/23/2014  . Myocardial infarction ([place]) 02/2017   "light one" (10/12/2017)  . Non-compliant behavior    non compliant with diaylsis per daughter  . PAT (paroxysmal atrial tachycardia) ([place])   . Peripheral arterial disease ([place])    Occluded left superficial femoral artery status post stent June 2016 - Dr. Trula Slade  . Pneumonia 1961  . Polycystic kidney, unspecified type   . Syncope 09/2014  . SYNCOPE 05/07/2010   Qualifier: Diagnosis of  By: Laurance Flatten RN, BSN, Anderson Malta      Past Surgical History:  Procedure Laterality Date  . ABDOMINAL AORTOGRAM N/A 08/11/2017   Procedure: ABDOMINAL AORTOGRAM;  Surgeon: Serafina Mitchell, MD;  Location: [name];  Service: Cardiovascular;  Laterality: N/A;  . ANTERIOR CERVICAL DECOMP/DISCECTOMY FUSION    . APPLICATION OF WOUND VAC Right 08/14/2017   Procedure: APPLICATION OF PREVENA INCISIONAL WOUND VAC RIGHT GROIN;  Surgeon: Serafina Mitchell, MD;  Location: [name];  Service: Vascular;  Laterality: Right;  .  AV FISTULA PLACEMENT Left 09/04/2017   Procedure: ARTERIOVENOUS (AV) FISTULA CREATION LEFT UPPER ARM;  Surgeon: Serafina Mitchell, MD;  Location: [name];  Service: Vascular;  Laterality: Left;  . BACK SURGERY    . BASCILIC VEIN TRANSPOSITION Left 11/20/2017   Procedure: SECOND STAGE BASILIC VEIN TRANSPOSITION LEFT ARM;  Surgeon: Serafina Mitchell, MD;  Location: [name];  Service: Vascular;  Laterality: Left;  . BIOPSY  12/17/2016   Procedure: BIOPSY;  Surgeon: Daneil Dolin, MD;  Location: [name];  Service: Gastroenterology;;  gastric colon  . BIOPSY  04/29/2017   Procedure: BIOPSY;  Surgeon: Daneil Dolin, MD;  Location: [name];  Service: Endoscopy;;  duodenal biopsies  . BUNIONECTOMY Bilateral   . COLONOSCOPY  2008   Dr. Oneida Alar: rare sigmoid colon diverticulosis, internal hemorrhoids.   . COLONOSCOPY WITH PROPOFOL N/A 12/17/2016   dense left-sided diverticulosis, right colon ulcers s/p biopsy query occult NSAID use vs transient ischemia, not consistent with IBD. CMV stains negative.   Marland Kitchen ENDARTERECTOMY FEMORAL Right 08/14/2017   Procedure: RIGHT ILLIO-FEMORAL ENDARTERECTOMY;  Surgeon: Serafina Mitchell, MD;  Location: [name];  Service: Vascular;  Laterality: Right;  . ESOPHAGEAL DILATION  12/17/2016   EGD with mild Schatzki's ring s/p dilatation, small hiatal hernia, erosive gastropathy (negative H.pylori gastritis)  . ESOPHAGOGASTRODUODENOSCOPY  2008   Dr. Oneida Alar: normal esophagus without Barrett's, antritis and duodenitis, path with H.pylori gastritis  . ESOPHAGOGASTRODUODENOSCOPY (EGD) WITH PROPOFOL N/A 12/17/2016   Procedure: ESOPHAGOGASTRODUODENOSCOPY (EGD) WITH PROPOFOL;  Surgeon: Daneil Dolin, MD;  Location: [name];  Service: Gastroenterology;  Laterality: N/A;  . ESOPHAGOGASTRODUODENOSCOPY (EGD) WITH PROPOFOL N/A 04/29/2017   Patchy erythema of gastric mucosa diffusely, extensive inflammatory changes in duodenum, geographic ulceration and mucosal edema present, encroaching somewhat on the lumen yet still widely patent, distal second portion of duodenum appeared abnormal, path with peptic duodenitis with ulceration  . GROIN DEBRIDEMENT Right 10/14/2017   Procedure: RIGHT GROIN AND RIGHT LOWER QUADRANT ABDOMEN DEBRIDEMENT WITH PLACEMENT OF ANTIBIOTIC BEADS;  Surgeon: Serafina Mitchell, MD;  Location: [place];  Service: Vascular;  Laterality: Right;  . HEMATOMA EVACUATION Left 12/06/2017   Procedure: EVACUATION HEMATOMA;  Surgeon: Waynetta Sandy, MD;  Location: [place];  Service: Vascular;  Laterality: Left;  . HERNIA REPAIR  3532   umbilical  . I&D EXTREMITY Right 09/21/2017   Procedure: IRRIGATION AND DEBRIDEMENT GROIN;  Surgeon: Elam Dutch, MD;  [name];  Service: Vascular;  Laterality: Right;  . I&D EXTREMITY Left 11/23/2017   Procedure: Evacuation Hematoma LEFT UPPER ARM GRAFT;  Surgeon: Elam Dutch, MD;  Location: [place];  Service: Vascular;  Laterality: Left;  . INGUINAL HERNIA REPAIR Right 02/2017   [place]  . INSERTION OF DIALYSIS CATHETER Right 09/04/2017   Procedure: INSERTION OF TUNNELED  DIALYSIS CATHETER - RIGHT INTERNAL JUGULAR PLACEMENT;  Surgeon: Serafina Mitchell, MD;  Location: [place];  Service: Vascular;  Laterality: Right;  . INSERTION OF ILIAC STENT Right 08/14/2017   Procedure: INSERTION OF RIGHT COMMON ILIAC STENT 71mm x 30mm x 130cm INSERTION OF RIGHT EXTERNAL ILIAC STENT 75mm x 80mm x 130cm INSERTION OF SUPERFICIAL FERMORAL ARTERY STENT 88mm x 77mm x 130cm;  Surgeon: Serafina Mitchell, MD;  Location: [name];  Service: Vascular;  Laterality: Right;  . IR FLUORO GUIDE CV LINE RIGHT  08/31/2017  . IR US GUIDE VASC ACCESS RIGHT  08/31/2017  . LOWER EXTREMITY ANGIOGRAPHY Right 08/11/2017  Procedure: LOWER EXTREMITY ANGIOGRAPHY;  Surgeon: Serafina Mitchell, MD;  Location: [name];  Service: Cardiovascular;  Laterality: Right;  . PATCH ANGIOPLASTY Right 08/14/2017   Procedure: PATCH ANGIOPLASTY USING HEMASHIELD PATCH 0.3IN Lillie Columbia;  Surgeon: Serafina Mitchell, MD;  [facility]: [name];  Service: Vascular;  Laterality: Right;  . PERIPHERAL VASCULAR CATHETERIZATION N/A 09/20/2014   Procedure: Abdominal Aortogram;  Surgeon: Serafina Mitchell, MD;  Location: [place] [facility];  Service: Cardiovascular;  Laterality: N/A;  . POSTERIOR FUSION LUMBAR SPINE    . TEE WITHOUT CARDIOVERSION N/A 01/12/2018   Procedure: TRANSESOPHAGEAL ECHOCARDIOGRAM (TEE);  Surgeon: Lelon Perla, MD;  Location: [facility];   Service: Cardiovascular;  Laterality: N/A;     reports that he has been smoking cigarettes. He has a 94.00 pack-year smoking history. He has never used smokeless tobacco. He reports that he drank alcohol. He reports that he has current [name] past drug history.  No Known Allergies  Family History  Problem Relation Age of Onset  . Alcoholism Mother   . Heart disease Father        Massive heart attack  . Heart attack Father   . Atrial fibrillation Father   . Colon cancer Father   . Colon cancer Maternal Grandfather 20  . Alcoholism Maternal Grandfather   . Renal cancer Cousin   . Ovarian cancer Sister    Prior to Admission medications   Medication Sig Start Date End Date Taking? Authorizing Provider  B Complex-C-Folic Acid (DIALYVITE 694) 0.8 MG TABS Take 1 tablet by mouth daily. 09/15/17   [provider]  BREO ELLIPTA 200-25 MCG/INH AEPB Inhale 1 puff into the lungs every evening.  07/27/17   [provider]  calcitRIOL (ROCALTROL) 0.25 MCG capsule Take 1 capsule (0.25 mcg total) by mouth Every Tuesday,Thursday,and Saturday with dialysis. 09/08/17   Ghimire, Henreitta Leber, MD  calcium acetate (PHOSLO) 667 MG capsule Take 667 mg by mouth at bedtime. Prescribed three times daily with meals 09/15/17   [provider]  carvedilol (COREG) 25 MG tablet Take 1 tablet (25 mg total) 2 (two) times daily by mouth. Patient taking differently: Take 25 mg by mouth at bedtime. Prescribed one tablet twice daily 02/25/17 01/09/18  Imogene Burn, PA-C  cholecalciferol (VITAMIN D) 1000 units tablet Take 2,000 Units by mouth daily.    [provider]  clopidogrel (PLAVIX) 75 MG tablet Take 75 mg by mouth daily.  12/24/17   [provider]  furosemide (LASIX) 40 MG tablet Take 40 mg by mouth every morning.  12/23/17   [provider]  metoprolol succinate (TOPROL-XL) 25 MG 24 hr tablet Take 25 mg by mouth daily.  09/24/14   [provider]  Nutritional  Supplements (FEEDING SUPPLEMENT, NEPRO CARB STEADY,) LIQD Take 237 mLs by mouth 2 (two) times daily between meals. 10/21/17   Rhyne, Hulen Shouts, PA-C  oxyCODONE-acetaminophen (PERCOCET) 10-325 MG tablet Take 1 tablet by mouth every 6 (six) hours as needed for pain.    [provider]  pantoprazole (PROTONIX) 40 MG tablet Take 1 tablet (40 mg total) by mouth 2 (two) times daily. 04/30/17 01/09/18  Manuella Ghazi, Pratik D, DO  warfarin (COUMADIN) 5 MG tablet Take 1 tablet (5 mg total) by mouth daily. Patient not taking: Reported on 01/09/2018 10/21/17 10/21/18  Gabriel Earing, PA-C    Physical Exam: Vitals:   01/27/18 0000 01/27/18 0030 01/27/18 0045 01/27/18 0100  BP: (!) 146/102 (!) 148/115  Marland Kitchen)  170/114  Pulse:    (!) 114  Resp: 18     Temp:      SpO2:    97%  Weight:   90.1 kg     Constitutional: Looks chronically ill, but in NAD.  Has tobacco smoke smell on hair and close. Eyes: PERRL, lids and conjunctivae normal ENMT: Mucous membranes are moist. Posterior pharynx clear of any exudate [name] lesions. Neck: normal, supple, no masses, no thyromegaly Chest: Right upper chest catheter.  respiratory: Mildly decreased breath sounds with bilateral wheezing and rhonchi, no crackles. Normal respiratory effort. No accessory muscle use.  Cardiovascular: Tachycardic between 120s to 160s Bpm, no murmurs / rubs / gallops.  2+ lower extremities pitting edema. 2+ pedal pulses. No carotid bruits.  Abdomen: Soft, no tenderness, no masses palpated. No hepatosplenomegaly. Bowel sounds positive.  Musculoskeletal: no clubbing / cyanosis.  Good ROM, no contractures. Normal muscle tone.  Skin: no rashes, lesions, ulcers on very limited dermatological examination. Neurologic: CN 2-12 grossly intact. Sensation intact, DTR normal. Strength 5/5 in all 4.  Psychiatric: Normal judgment and insight. Alert and oriented x 3. Normal mood.   Labs on Admission: I have personally reviewed following labs and imaging  studies  CBC: Recent Labs  Lab 01/26/18 2247  WBC 10.6*  NEUTROABS 9.0*  HGB 9.9*  HCT 32.4*  MCV 98.5  PLT 510   Basic Metabolic Panel: Recent Labs  Lab 01/26/18 2247  NA 133*  K 4.6  CL 103  CO2 20*  GLUCOSE 100*  BUN 42*  CREATININE 4.51*  CALCIUM 9.2   GFR: Estimated Creatinine Clearance: 21.1 mL/min (A) (by C-G formula based on SCr of 4.51 mg/dL (H)). Liver Function Tests: Recent Labs  Lab 01/26/18 2247  AST 15  ALT 9  ALKPHOS 130*  BILITOT 0.6  PROT 7.9  ALBUMIN 3.2*   No results for input(s): LIPASE, AMYLASE in the last 168 hours. Recent Labs  Lab 01/26/18 2247  AMMONIA 13   Coagulation Profile: No results for input(s): INR, PROTIME in the last 168 hours. Cardiac Enzymes: Recent Labs  Lab 01/26/18 2247  TROPONINI 0.11*   BNP (last 3 results) No results for input(s): PROBNP in the last 8760 hours. HbA1C: No results for input(s): HGBA1C in the last 72 hours. CBG: No results for input(s): GLUCAP in the last 168 hours. Lipid Profile: No results for input(s): CHOL, HDL, LDLCALC, TRIG, CHOLHDL, LDLDIRECT in the last 72 hours. Thyroid Function Tests: No results for input(s): TSH, T4TOTAL, FREET4, T3FREE, THYROIDAB in the last 72 hours. Anemia Panel: No results for input(s): VITAMINB12, FOLATE, FERRITIN, TIBC, IRON, RETICCTPCT in the last 72 hours. Urine analysis:    Component Value Date/Time   COLORURINE YELLOW 09/19/2017 0701   APPEARANCEUR CLEAR 09/19/2017 0701   LABSPEC 1.008 09/19/2017 0701   PHURINE 6.0 09/19/2017 0701   GLUCOSEU NEGATIVE 09/19/2017 0701   HGBUR SMALL (A) 09/19/2017 0701   BILIRUBINUR NEGATIVE 09/19/2017 0701   KETONESUR NEGATIVE 09/19/2017 0701   PROTEINUR 30 (A) 09/19/2017 0701   UROBILINOGEN 0.2 03/03/2010 1056   NITRITE NEGATIVE 09/19/2017 0701   LEUKOCYTESUR NEGATIVE 09/19/2017 0701    Radiological Exams on Admission: Dg Chest 2 View  Result Date: 01/26/2018 CLINICAL DATA:  Shortness of breath EXAM: CHEST  - 2 VIEW COMPARISON:  01/09/2018, 12/04/2017 FINDINGS: Right-sided central venous catheter tip over the SVC. Bibasilar atelectasis [name] scarring. No pleural effusion. No focal consolidation. Stable cardiomediastinal silhouette with aortic atherosclerosis. No pneumothorax. IMPRESSION: No active cardiopulmonary disease. Stable scarring  and/[name] atelectasis at both lung bases. Electronically Signed   By: Donavan Foil M.D.   On: 01/26/2018 23:23   EKG: Independently reviewed. Vent. rate 118 BPM PR interval * ms QRS duration 89 ms QT/QTc 340/477 ms P-R-T axes * -22 72  Atrial fibrillation Borderline left axis deviation Anteroseptal infarct, old  TEE - most recent  LV EF: 50% - 55%  ------------------------------------------------------------------- History: PMH: Discitis of multiple sites of spine.  ------------------------------------------------------------------- Study Conclusions  - Left ventricle: Systolic function was normal. The estimated ejection fraction was in the range of 50% to 55%. - Aortic valve: No evidence of vegetation. - Mitral valve: No evidence of vegetation. There was mild regurgitation. - Left atrium: The atrium was mildly dilated. No evidence of thrombus in the atrial cavity [name] appendage. - Right atrium: The atrium was mildly dilated. No evidence of thrombus in the atrial cavity [name] appendage. - Tricuspid valve: No evidence of vegetation. - Pulmonic valve: No evidence of vegetation.  Impressions:  - Normal LV function; no vegetations.  Recommendations: Pt in atrial fibrillation during the study; hypokinesis of the anteroseptal wall with overall preserved LV function; mild biatrial enlargement; mild MR; no vegetations.  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 Continue supplemental oxygen. IV fluid boluses deferred due to ESRD. Cefazolin per  pharmacy. Vancomycin per pharmacy. Consider repeat imaging. Consider reconsulting infectious diseases. Consider consulting neurosurgery. Advised several times not to sign AMA given the seriousness of this issue. Advised to make his health is #1 priority.  Active Problems:   Polycystic kidney disease Not had dialysis in almost 2 weeks. Will need dialysis today. In the meantime: furosemide 80 mg IVP x1. Nitropaste to anterior chest wall. Supplemental oxygen.    UNSPECIFIED IRON DEFICIENCY ANEMIA Monitor hematocrit and hemoglobin.    Essential hypertension Resume carvedilol. Monitor blood pressure and heart rate.    Esophageal reflux Resume Protonix.    Tobacco abuse Smoking cessation is strongly advised. Declined nicotine replacement therapy. Staff to provide smoking cessation information. Patient was told to make his health his top priority.    PAD (peripheral artery disease) (HCC) Resume Plavix. Needs to treat hyperlipidemia. Smoking sensation up twice.    AF (paroxysmal atrial fibrillation) (HCC) CHA2 DS2 VASc of the history. Resume carvedilol. Not on anticoagulation.    Hyperlipidemia Not on medical therapy. Should discussed with primary care.    DVT prophylaxis: Heparin SQ. Code Status: Full code. Family Communication: Disposition Plan: Admit to [facility].  Will need renal and ID involvement. Consults called:  Admission status: Inpatient/telemetry.   Reubin Milan MD [facility] Pager [phone]  If 7PM-7AM, please contact night-coverage [email or web address] Password [email or web address]  01/27/2018, 1:24 AM

## 2018-01-28 ENCOUNTER — Inpatient Hospital Stay (HOSPITAL_COMMUNITY): Payer: 59

## 2018-01-28 DIAGNOSIS — M4640 Discitis, unspecified, site unspecified: Secondary | ICD-10-CM

## 2018-01-28 DIAGNOSIS — Z992 Dependence on renal dialysis: Secondary | ICD-10-CM

## 2018-01-28 DIAGNOSIS — N186 End stage renal disease: Secondary | ICD-10-CM

## 2018-01-28 LAB — RENAL FUNCTION PANEL
ALBUMIN: 2.4 g/dL — AB (ref 3.5–5.0)
Anion gap: 9 (ref 5–15)
BUN: 39 mg/dL — ABNORMAL HIGH (ref 8–23)
CO2: 22 mmol/L (ref 22–32)
Calcium: 8.6 mg/dL — ABNORMAL LOW (ref 8.9–10.3)
Chloride: 103 mmol/L (ref 98–111)
Creatinine, Ser: 4.85 mg/dL — ABNORMAL HIGH (ref 0.61–1.24)
GFR, EST AFRICAN AMERICAN: 14 mL/min — AB (ref >60)
GFR, EST NON AFRICAN AMERICAN: 12 mL/min — AB (ref >60)
Glucose, Bld: 113 mg/dL — ABNORMAL HIGH (ref 70–99)
PHOSPHORUS: 5.5 mg/dL — AB (ref 2.5–4.6)
Potassium: 4.6 mmol/L (ref 3.5–5.1)
Sodium: 134 mmol/L — ABNORMAL LOW (ref 135–145)

## 2018-01-28 LAB — CBC WITH DIFFERENTIAL/PLATELET
ABS IMMATURE GRANULOCYTES: 0.02 10*3/uL (ref 0.00–0.07)
BASOS ABS: 0.1 10*3/uL (ref 0.0–0.1)
BASOS PCT: 1 %
Eosinophils Absolute: 0.2 10*3/uL (ref 0.0–0.5)
Eosinophils Relative: 3 %
HEMATOCRIT: 29.5 % — AB (ref 39.0–52.0)
Hemoglobin: 9.1 g/dL — ABNORMAL LOW (ref 13.0–17.0)
IMMATURE GRANULOCYTES: 0 %
LYMPHS ABS: 1.1 10*3/uL (ref 0.7–4.0)
LYMPHS PCT: 13 %
MCH: 29.6 pg (ref 26.0–34.0)
MCHC: 30.8 g/dL (ref 30.0–36.0)
MCV: 96.1 fL (ref 80.0–100.0)
Monocytes Absolute: 0.7 10*3/uL (ref 0.1–1.0)
Monocytes Relative: 7 %
Neutro Abs: 6.7 10*3/uL (ref 1.7–7.7)
Neutrophils Relative %: 76 %
PLATELETS: 261 10*3/uL (ref 150–400)
RBC: 3.07 MIL/uL — ABNORMAL LOW (ref 4.22–5.81)
RDW: 18.3 % — ABNORMAL HIGH (ref 11.5–15.5)
WBC: 8.8 10*3/uL (ref 4.0–10.5)
nRBC: 0 % (ref 0.0–0.2)

## 2018-01-28 LAB — PROTIME-INR
INR: 1.24
PROTHROMBIN TIME: 15.5 s — AB (ref 11.4–15.2)

## 2018-01-28 LAB — HEPARIN LEVEL (UNFRACTIONATED): HEPARIN UNFRACTIONATED: 0.29 [IU]/mL — AB (ref 0.30–0.70)

## 2018-01-28 LAB — URINE CULTURE
CULTURE: NO GROWTH
SPECIAL REQUESTS: NORMAL

## 2018-01-28 LAB — MAGNESIUM: Magnesium: 1.8 mg/dL (ref 1.7–2.4)

## 2018-01-28 LAB — C-REACTIVE PROTEIN: CRP: 16.9 mg/dL — AB (ref <1.0)

## 2018-01-28 LAB — SEDIMENTATION RATE: SED RATE: 118 mm/h — AB (ref 0–16)

## 2018-01-28 LAB — PHOSPHORUS: PHOSPHORUS: 5.6 mg/dL — AB (ref 2.5–4.6)

## 2018-01-28 MED ORDER — LEVALBUTEROL HCL 0.63 MG/3ML IN NEBU: 0.6300 mg | INHALATION_SOLUTION | Freq: Two times a day (BID) | RESPIRATORY_TRACT | Status: DC

## 2018-01-28 MED ORDER — LIDOCAINE HCL (PF) 2 % IJ SOLN
0.0000 mL | Freq: Once | INTRAMUSCULAR | Status: DC | PRN
  Filled 2018-01-28: qty 20

## 2018-01-28 MED ORDER — FUROSEMIDE 40 MG PO TABS
40.0000 mg | ORAL_TABLET | Freq: Once | ORAL | Status: AC
  Administered 2018-01-28: 40 mg via ORAL
  Filled 2018-01-28: qty 1

## 2018-01-28 MED ORDER — LEVALBUTEROL HCL 0.63 MG/3ML IN NEBU
0.6300 mg | INHALATION_SOLUTION | Freq: Two times a day (BID) | RESPIRATORY_TRACT | Status: DC
  Administered 2018-01-28 – 2018-01-29 (×2): 0.63 mg via RESPIRATORY_TRACT
  Filled 2018-01-28 (×2): qty 3

## 2018-01-28 MED ORDER — FUROSEMIDE 80 MG PO TABS
80.0000 mg | ORAL_TABLET | Freq: Every day | ORAL | Status: DC
  Administered 2018-01-29: 80 mg via ORAL
  Filled 2018-01-28: qty 1

## 2018-01-28 MED ORDER — IPRATROPIUM BROMIDE 0.02 % IN SOLN
0.5000 mg | Freq: Two times a day (BID) | RESPIRATORY_TRACT | Status: DC
  Administered 2018-01-28 – 2018-01-29 (×2): 0.5 mg via RESPIRATORY_TRACT
  Filled 2018-01-28 (×2): qty 2.5

## 2018-01-28 MED ORDER — OXYCODONE-ACETAMINOPHEN 5-325 MG PO TABS
ORAL_TABLET | ORAL | Status: AC
  Filled 2018-01-28: qty 1

## 2018-01-28 MED ORDER — SODIUM CHLORIDE 0.9 % IV SOLN
1.0000 g | INTRAVENOUS | Status: DC
  Administered 2018-01-28: 1 g via INTRAVENOUS
  Filled 2018-01-28 (×2): qty 1

## 2018-01-28 MED ORDER — OXYCODONE HCL 5 MG PO TABS
ORAL_TABLET | ORAL | Status: AC
  Administered 2018-01-28: 5 mg
  Filled 2018-01-28: qty 1

## 2018-01-28 MED ORDER — WARFARIN SODIUM 5 MG PO TABS
5.0000 mg | ORAL_TABLET | Freq: Once | ORAL | Status: AC
  Administered 2018-01-28: 5 mg via ORAL
  Filled 2018-01-28: qty 1

## 2018-01-28 MED ORDER — IPRATROPIUM BROMIDE 0.02 % IN SOLN: 0.5000 mg | Freq: Two times a day (BID) | RESPIRATORY_TRACT | Status: DC

## 2018-01-28 NOTE — Progress Notes (Signed)
H&P    CC:  Catheter removal   HPI:  This is a 62 y.o. male in need of tunneled dialysis catheter removal.  He was admitted to the hospital a couple of days ago with difficulty breathing and weak and jerking of arms and legs.  He was also having fever and chills with rigors.  He had not gone to his dialysis sessions and had missed 2-3 weeks of dialysis.  He was diagnosed with paraspinal abscess earlier in the month with +bacillus growing.  He had missed his abx as he was supposed to get them on dialysis.  He says he makes a lot of urine.  Renal and the pt agreed that he would have dialysis and if his jerking was not relieved, , he could come off of dialysis.  Discussed with Dr. Melvia Heaps and we will remove the [facility] today.  He is on coumadin.  His INR today is 1.24.     Past Medical History:  Diagnosis Date  . Anxiety   . Arthritis    knees , back , shoulders (10/12/2017)  . Atrial fibrillation ([place])   . CAD (coronary artery disease)    Mild nonobstructive disease at cardiac catheterization 2002  . Chronic back pain    "back of my neck; down thru my legs" (10/12/2017)  . COPD (chronic obstructive pulmonary disease) ([place])   . Diverticulitis   . Esophageal reflux   . ESRD (end stage renal disease) on dialysis [facility])    "TTS; [facility]" (11/23/2017)  . Essential hypertension   . Hepatitis C    states he no longer has this  . History of kidney stones   . History of syncope   . Hyperlipidemia   . Jerking 09/23/2014  . Myocardial infarction ([place]) 02/2017   "light one" (10/12/2017)  . Non-compliant behavior    non compliant with diaylsis per daughter  . PAT (paroxysmal atrial tachycardia) ([place])   . Peripheral arterial disease ([place])    Occluded left superficial femoral artery status post stent June 2016 - Dr. Trula Slade  . Pneumonia 1961  . Polycystic kidney, unspecified type   . Syncope 09/2014  . SYNCOPE 05/07/2010   Qualifier: Diagnosis of  By: Laurance Flatten RN, BSN, Anderson Malta      FH:   Non-Contributory  Social History   Socioeconomic History  . Marital status: Legally Separated    Spouse name: Not on file  . Number of children: 2  . Years of education: Not on file  . Highest education level: Not on file  Occupational History  . Occupation: DISABLED  Social Needs  . Financial resource strain: Not on file  . Food insecurity:    Worry: Not on file    Inability: Not on file  . Transportation needs:    Medical: No    Non-medical: No  Tobacco Use  . Smoking status: Current Every Day Smoker    Packs/day: 2.00    Years: 47.00    Pack years: 94.00    Types: Cigarettes    Last attempt to quit: 08/03/2017    Years since quitting: 0.4  . Smokeless tobacco: Never Used  Substance and Sexual Activity  . Alcohol use: Not Currently    Comment: 10/12/2017 alcohol free since 2017,  heavy drinker in the past  . Drug use: Not Currently    Comment: "<2003 whatever was around; nothing since"  . Sexual activity: Not Currently  Lifestyle  . Physical activity:    Days per week: Not on file  Minutes per session: Not on file  . Stress: Not on file  Relationships  . Social connections:    Talks on phone: Not on file    Gets together: Not on file    Attends religious service: Not on file    Active member of club or organization: Not on file    Attends meetings of clubs or organizations: Not on file    Relationship status: Not on file  . Intimate partner violence:    Fear of current or ex partner: Not on file    Emotionally abused: Not on file    Physically abused: Not on file    Forced sexual activity: Not on file  Other Topics Concern  . Not on file  Social History Narrative   Disabled from Back since 2007    No Known Allergies  Current Facility-Administered Medications  Medication Dose Route Frequency Provider Last Rate Last Dose  . acetaminophen (TYLENOL) tablet 650 mg  650 mg Oral Q6H PRN Amin, Ankit Chirag, MD       Or  . acetaminophen (TYLENOL) suppository  650 mg  650 mg Rectal Q6H PRN Amin, Ankit Chirag, MD      . budesonide (PULMICORT) nebulizer solution 0.5 mg  0.5 mg Nebulization BID Amin, Ankit Chirag, MD   0.5 mg at 01/28/18 0755  . calcitRIOL (ROCALTROL) capsule 0.25 mcg  0.25 mcg Oral Q T,Th,Sa-HD Amin, Ankit Chirag, MD      . calcium acetate (PHOSLO) capsule 667 mg  667 mg Oral QHS Amin, Ankit Chirag, MD      . carvedilol (COREG) tablet 6.25 mg  6.25 mg Oral BID WC Amin, Ankit Chirag, MD   6.25 mg at 01/28/18 0957  . cefTAZidime (FORTAZ) 1 g in sodium chloride 0.9 % 100 mL IVPB  1 g Intravenous Q24H Skeet Simmer, [facility]      . Chlorhexidine Gluconate Cloth 2 % PADS 6 each  6 each Topical Q0600 Roney Jaffe, MD      . Chlorhexidine Gluconate Cloth 2 % PADS 6 each  6 each Topical Q0600 Roney Jaffe, MD      . clopidogrel (PLAVIX) tablet 75 mg  75 mg Oral Daily Amin, Ankit Chirag, MD   75 mg at 01/28/18 0957  . feeding supplement (NEPRO CARB STEADY) liquid 237 mL  237 mL Oral BID BM Amin, Ankit Chirag, MD   237 mL at 01/28/18 0957  . fluticasone furoate-vilanterol (BREO ELLIPTA) 200-25 MCG/INH 1 puff  1 puff Inhalation Q2000 Amin, Ankit Chirag, MD   1 puff at 01/27/18 1956  . furosemide (LASIX) tablet 40 mg  40 mg Oral q morning - 10a Amin, Ankit Chirag, MD   40 mg at 01/28/18 0957  . ipratropium (ATROVENT) nebulizer solution 0.5 mg  0.5 mg Nebulization TID Amin, Ankit Chirag, MD   0.5 mg at 01/28/18 0755  . levalbuterol (XOPENEX) nebulizer solution 0.63 mg  0.63 mg Nebulization TID Damita Lack, MD   0.63 mg at 01/28/18 0755  . lidocaine (XYLOCAINE) 2 % injection 0-20 mL  0-20 mL Intradermal Once PRN Kenshin Splawn J, PA-C      . MEDLINE mouth rinse  15 mL Mouth Rinse BID Amin, Ankit Chirag, MD      . ondansetron (ZOFRAN) tablet 4 mg  4 mg Oral Q6H PRN Amin, Ankit Chirag, MD       Or  . ondansetron (ZOFRAN) injection 4 mg  4 mg Intravenous Q6H PRN Amin, Jeanella Flattery, MD      .  oxyCODONE (Oxy IR/ROXICODONE) 5 MG immediate release  tablet           . oxyCODONE-acetaminophen (PERCOCET/ROXICET) 5-325 MG per tablet 1 tablet  1 tablet Oral Q6H PRN Damita Lack, MD   1 tablet at 01/28/18 0829   And  . oxyCODONE (Oxy IR/ROXICODONE) immediate release tablet 5 mg  5 mg Oral Q6H PRN Damita Lack, MD   5 mg at 01/28/18 0829  . oxyCODONE-acetaminophen (PERCOCET/ROXICET) 5-325 MG per tablet           . pantoprazole (PROTONIX) EC tablet 40 mg  40 mg Oral BID Amin, Ankit Chirag, MD   40 mg at 01/28/18 0957  . vancomycin variable dose per unstable renal function (pharmacist dosing)   Does not apply See admin instructions Joselyn Glassman A, RPH      . warfarin (COUMADIN) tablet 5 mg  5 mg Oral ONCE-1800 Skeet Simmer, [facility]      . Warfarin - Pharmacist Dosing Inpatient   Does not apply q1800 Amin, Ankit Chirag, MD        ROS:  See HPI  PHYSICAL EXAM  Vitals:   01/28/18 0755 01/28/18 0756  BP:    Pulse: 97   Resp: 16   Temp:    SpO2: 98% 98%    Gen:  Well developed well nourished HEENT:  normocephalic Neck:  Right IJ catheter in place Lungs:  Non-labored Extremities:  Left arm incisions are healed; right groin incision is healed Skin:  No obvious rashes Neuro:  In tact  Lab:  INR 1.24  Impression: This is a 62 y.o. male who will be coming off dialysis and we are asked to removed his tunneled dialysis catheter.    Plan:  Removal of right IJ diatek catheter; pt is on coumadin but INR is 1.24 today.   Leontine Locket, PA-C [facility] [phone] 01/28/2018 12:17 PM

## 2018-01-28 NOTE — Care Management Note (Signed)
Case Management Note  Patient Details  Name: Aaron [name] MRN: [id number] Date of Birth: 12-Apr-1956  Subjective/Objective:                    Action/Plan:  No discharge needs identified at this time. Will continue to follow. Expected Discharge Date:  02/01/18               Expected Discharge Plan:  Home/Self Care  In-House Referral:  NA  Discharge planning Services  CM Consult  Post Acute Care Choice:  NA Choice offered to:  NA  DME Arranged:  N/A DME Agency:  NA  HH Arranged:  NA HH Agency:  NA  Status of Service:  In process, will continue to follow  If discussed at Long Length of Stay Meetings, dates discussed:    Additional Comments:  Marilu Favre, RN 01/28/2018, 10:23 AM

## 2018-01-28 NOTE — Progress Notes (Signed)
  Catheter Removal Procedure Note    Diagnosis: ESRD  Plan:  Remove right diatek catheter  Consent signed:  Yes.   Time out completed:  Yes.   Coumadin:  Yes.   PT/INR (if applicable):  8.56 Other labs:  Procedure: 1.  Sterile prepping and draping over catheter area 2. 5 ml 2% lidocaine plain instilled at removal site. 3.  right catheter removed in its entirety with cuff in tact. 4.  Complications:  none 5. Tip of catheter sent for culture:  No.   Patient tolerated procedure well:  Yes.   Pressure held, no bleeding noted, dressing applied Instructions given to the pt regarding wound care and bleeding.  OtherLeontine Locket, PA-C 01/28/2018 2:01 PM [phone]

## 2018-01-28 NOTE — Progress Notes (Addendum)
[facility] Progress Note  Subjective: states he peed a lot again last night, not having any jerking of extremities today, doesn't want to do HD anymore, " I don't think I need it".  Creat stable mid 4's overnight w/o HD.   Vitals:   01/28/18 0732 01/28/18 0755 01/28/18 0756 01/28/18 1000  BP: (!) 150/96     Pulse: 92 97    Resp: 18 16    Temp: 98.1 F (36.7 C)     TempSrc: Oral     SpO2: 97% 98% 98%   Weight:      Height:    '6\' 4"'  (1.93 m)    Inpatient medications: . budesonide (PULMICORT) nebulizer solution  0.5 mg Nebulization BID  . calcitRIOL  0.25 mcg Oral Q T,Th,Sa-HD  . calcium acetate  667 mg Oral QHS  . carvedilol  6.25 mg Oral BID WC  . Chlorhexidine Gluconate Cloth  6 each Topical Q0600  . Chlorhexidine Gluconate Cloth  6 each Topical Q0600  . clopidogrel  75 mg Oral Daily  . feeding supplement (NEPRO CARB STEADY)  237 mL Oral BID BM  . fluticasone furoate-vilanterol  1 puff Inhalation Q2000  . furosemide  40 mg Oral q morning - 10a  . ipratropium  0.5 mg Nebulization TID  . levalbuterol  0.63 mg Nebulization TID  . mouth rinse  15 mL Mouth Rinse BID  . oxyCODONE      . oxyCODONE-acetaminophen      . pantoprazole  40 mg Oral BID  . vancomycin variable dose per unstable renal function (pharmacist dosing)   Does not apply See admin instructions  . warfarin  5 mg Oral ONCE-1800  . Warfarin - Pharmacist Dosing Inpatient   Does not apply q1800   . cefTAZidime (FORTAZ)  IV     acetaminophen **OR** acetaminophen, lidocaine, ondansetron **OR** ondansetron (ZOFRAN) IV, oxyCODONE-acetaminophen **AND** oxyCODONE  Iron/TIBC/Ferritin/ %Sat    Component Value Date/Time   IRON 45 08/31/2017 0427   TIBC 232 (L) 08/31/2017 0427   IRONPCTSAT 19 08/31/2017 0427    Exam: Gen alert, no distress, asterixis resolved No rash, cyanosis or gangrene Sclera anicteric, throat clear No jvd or bruits Chest clear bilat to base Cor irreg no mrg Abd soft ntnd no  mass or ascites +bs GU normal male MS no joint effusions or deformity Ext 1+ ankle edema bilat Neuro NF , alert, Ox 3   CXR - no active disease   Home meds:  - carvedilol 25 bid/ furosemide 40 qd/ metoprolol succinate 25 qd  - clopidogrel 75 qd/ vitamins/ prns  - fluticasone- vilanterol hs/ pantoprazole 40 bid/ oxy -acetaminophen prn/ calc acetate tid ac   Dialysis: [facility] MWF  4h   79.5kg (hasn't met [facility] in a long time)  2/2.5 bath  TDC  Hep none      Impression/ Plan: 1. CKD V - pt wants to come off dialysis, he is OK to come off.  Creat stable mid 4's not uremic and making plenty of urine.  Will have TDC removed today. Will start lasix 80 qd po. We will call pt to sched f/u w/ his nephrologist Dr Joelyn Oms for CKD.  Please call w/ any questions, will sign off.  2. HTN - cont meds 3. COPD 4. AFib  5. Anemia ckd - Hb 9.3 6. Back infection - will not be able to get abx at HD now, if needs abx will need another route, have d/w pmd.    Rob  Bolton Canupp MD [facility] pager [phone]   01/28/2018, 12:55 PM   Recent Labs  Lab 01/26/18 2247 01/27/18 0415 01/28/18 0642  NA 133*  --  134*  K 4.6  --  4.6  CL 103  --  103  CO2 20*  --  22  GLUCOSE 100*  --  113*  BUN 42*  --  39*  CREATININE 4.51*  --  4.85*  CALCIUM 9.2  --  8.6*  PHOS  --   --  5.6*  5.5*  ALBUMIN 3.2*  --  2.4*  INR  --  1.35 1.24   Recent Labs  Lab 01/26/18 2247  AST 15  ALT 9  ALKPHOS 130*  BILITOT 0.6  PROT 7.9   Recent Labs  Lab 01/27/18 0415 01/28/18 0642  WBC 8.5 8.8  NEUTROABS 6.6 6.7  HGB 9.0* 9.1*  HCT 28.6* 29.5*  MCV 96.3 96.1  PLT 245 261

## 2018-01-28 NOTE — Progress Notes (Signed)
ANTICOAGULATION + ANTIBIOTIC CONSULT NOTE - Follow Up Consult  Pharmacy Consult for Coumadin;  Vancomycin and Ceftazidime Indication: atrial fibrillation and paraspinal abscess/discitis   No Known Allergies  Patient Measurements: Height: 6\' 4"  (193 cm) Weight: 187 lb 6.3 oz (85 kg) IBW/kg (Calculated) : 86.8 Heparin Dosing Weight: 85 kg  Vital Signs: Temp: 98.1 F (36.7 C) (10/17 0732) Temp Source: Oral (10/17 0732) BP: 150/96 (10/17 0732) Pulse Rate: 97 (10/17 0755)  Labs: Recent Labs    01/26/18 2247 01/27/18 0415 01/27/18 0725 01/27/18 1249 01/27/18 2055 01/28/18 0642  HGB 9.9* 9.0*  --   --   --  9.1*  HCT 32.4* 28.6*  --   --   --  29.5*  PLT 262 245  --   --   --  261  LABPROT  --  16.5*  --   --   --  15.5*  INR  --  1.35  --   --   --  1.24  HEPARINUNFRC  --   --   --  0.18* 0.33 0.29*  CREATININE 4.51*  --   --   --   --  4.85*  TROPONINI 0.11*  --  0.12* 0.09* 0.07*  --     Estimated Creatinine Clearance: 19.2 mL/min (A) (by C-G formula based on SCr of 4.85 mg/dL (H)).  Assessment:  62 yr old male on anticoagulation for atrial fibrillation.  Previously on Coumadin but not taking as prescribed.  INR 1.35 on 10/16.  Coumadin was resumed and heparin bridge added. Heparin level 0.29 this am on 1850 units/hr and rate increased to 1950 units/hr, but now stopping IV heparin.   INR 1.24 today after Coumadin 5 mg x 2 doses on 10/16. First dose ~3am then repeated at ~6pm.  Expect further increase in INR by am.   Previously on Coumadin 5 mg daily except 2.5 mg on Fridays per outpatient anticoag note.    On day #2 restart Vancomycin and Ceftazidime for hx Serratia and Enterococcus in wound culture 12/06/17 and Bacillus in 9/29 spinal aspirate culture.   Per 01/12/18 ID plan, Vanc and Ceftazidime were to continue for 6 weeks, through 02/21/18.  Left AMA on 01/13/18 and did not receive antibiotics after discharge due to scheduled with dialysis and missed outpatient dialysis  sessions. Now holding HD due to labs and increased urine output.  Scr 4.85 today, BUN 39.     Vancomycin 2 gm (1gm x 2 doses ~2am and 4am) given on 10/16.    Estimated Vanc peak level ~25-30 mcg/ml from loading dose.    Ceftazidime 2gm IV given on 10/16 ~1am.  Goal of Therapy:  INR 2-3 vancomycin troughs 10-15 mcg/ml  Appropriate Ceftazidime dose for renal function and indication Monitor platelets by anticoagulation protocol: Yes   Plan:   IV heparin discontinued. No bridge.  Coumadin 5 mg again today.  Daily PT/INR.  Continue Ceftazidime with 1 gm IV q24hrs.  Random Vancomycin level in am.  Re-dose Vancomycin when <15 mcg/ml or after HD, if dialysis is needed.  Follow renal function, +/- HD plans, and antibiotic plans.  [name], [place] Pager: [phone] or phone: [phone] 01/28/2018,11:25 AM

## 2018-01-28 NOTE — Progress Notes (Signed)
Patient brought to HD unit. Dr. Jonnie Finner at bedside. After review of labs/output decided to hold on HD. Will send back to room. No change in patient status. Medicated for back pain.

## 2018-01-28 NOTE — Progress Notes (Signed)
PROGRESS NOTE    Aaron [name]  CLE:[id number] DOB: 1955-08-09 DOA: 01/26/2018 PCP: Gwenlyn Saran, [place]   Brief Narrative:  62 year old with a history of anxiety, osteoarthritis, atrial fibrillation, CAD, chronic back pain, COPD, ESRD on hemodialysis, GERD, hepatitis C, essential hypertension, PAD, medical noncompliance came to the ER with complaints of difficulty breathing and generalized weakness.  Patient left AMA from [facility] 2 weeks ago after being diagnosed and partially treated for his paraspinal abscess.  Patient stated he has not been dialyzed since then.  Upon admission his creatinine was 4.5, BUN 42.  His chest x-ray did not show any acute pulmonary edema and his breathing status was okay.  He was noted to have low-grade fever.   Assessment & Plan:   Principal Problem:   Sepsis due to undetermined organism [facility]) Active Problems:   UNSPECIFIED IRON DEFICIENCY ANEMIA   Essential hypertension   Esophageal reflux   Tobacco abuse   PAD (peripheral artery disease) ([facility])   Polycystic kidney disease   AF (paroxysmal atrial fibrillation) ([facility])   Hyperlipidemia   Paraspinal abscess ([facility])   Lumbar discitis   Sepsis likely secondary to paraspinal abscess status post drainage Lumbar discitis Medical noncompliance Sepsis physiology appears to have resolved.  He is noted to be afebrile.  To follow-up on blood culture data.  Infectious disease has been consulted and is following.  Patient remains on vancomycin and ceftazidime.  Basilar species was isolated from the abscess during the previous hospitalization.  Continue pain management.  No vegetation was seen on TEE done on October 1.  Elevated troponin Mildly elevated troponin likely secondary to renal disease fluid overload demand ischemia.  Patient denies any chest pain.  EKG did not show any ischemic changes.  No further work-up at this time.   ESRD on hemodialysis Patient has been noncompliant.  He has not  been dialyzed.  However at the same time he is noted to have good urine output.  Nephrology was consulted.  Dialysis on hold at this time.  They feel that patient may not need outpatient dialysis.  Defer management to nephrology.  Anemia, iron deficiency and also chronic disease Hemoglobin remains stable.  Iron was 45 back in May.  No evidence of overt bleeding.  Continue to monitor.    Essential hypertension Blood pressure stable.  Continue Coreg 6.25 mg twice daily.  GERD Protonix 40 mg twice daily  Peripheral arterial disease Continue Plavix 75 mg daily. Not sure why he is not on statin.  Atrial fibrillation, paroxysmal Atrial fibrillation with RVR, one episode.  Treated with Lopressor 5 mg IV once Heart rate remains well controlled.  Continue beta-blocker.  It appears the patient has been on warfarin previously.  He left AGAINST MEDICAL ADVICE on October 2.  He has been noncompliant with his dialysis and with outpatient management along with the antibiotic management.  This makes me wonder if he is compliant with his anticoagulation as well.  His INR was subtherapeutic at admission.  No clear indication to bridge with heparin.  We will continue warfarin and emphasize compliance to the patient.   Hyperlipidemia Not on statin.  No recent lipid panel in our system.  This can be addressed by outpatient providers.     DVT prophylaxis: Warfarin Code Status: Full code Family Communication: Discussed with the patient. Disposition Plan: Management as outlined above.  He remains on IV antibiotics for recently diagnosed discitis.    Consultants:   Infectious disease  Nephrology  Procedures:  None  Antimicrobials:   Vancomycin  Ceftazidime   Subjective: Patient states that he is feeling better.  Shortness of breath is improved.  Back pain is reasonably well controlled.  Objective: Vitals:   01/28/18 0519 01/28/18 0732 01/28/18 0755 01/28/18 0756  BP: (!) 143/76 (!) 150/96     Pulse: (!) 109 92 97   Resp: 18 18 16    Temp: 98.1 F (36.7 C) 98.1 F (36.7 C)    TempSrc: Oral Oral    SpO2: 98% 97% 98% 98%  Weight:        Intake/Output Summary (Last 24 hours) at 01/28/2018 1024 Last data filed at 01/28/2018 0958 Gross per 24 hour  Intake 945.62 ml  Output 2500 ml  Net -1554.38 ml   Filed Weights   01/27/18 0045 01/27/18 0251 01/27/18 2210  Weight: 90.1 kg 85 kg 85 kg    Examination:  General exam: Awake alert.  In no distress Respiratory system: Diminished breath sounds at the bases.  Few crackles.  No rhonchi or wheezing. Cardiovascular system: S1-S2 is irregularly irregular.   Gastrointestinal system: Abdomen is soft.  Nontender nondistended.  Bowel sounds are present.  No masses organomegaly Central nervous system: Alert and oriented x3.  No obvious focal neurological deficits.    Data Reviewed:   CBC: Recent Labs  Lab 01/26/18 2247 01/27/18 0415 01/28/18 0642  WBC 10.6* 8.5 8.8  NEUTROABS 9.0* 6.6 6.7  HGB 9.9* 9.0* 9.1*  HCT 32.4* 28.6* 29.5*  MCV 98.5 96.3 96.1  PLT 262 245 811   Basic Metabolic Panel: Recent Labs  Lab 01/26/18 2247 01/28/18 0642  NA 133* 134*  K 4.6 4.6  CL 103 103  CO2 20* 22  GLUCOSE 100* 113*  BUN 42* 39*  CREATININE 4.51* 4.85*  CALCIUM 9.2 8.6*  MG  --  1.8  PHOS  --  5.6*  5.5*   GFR: Estimated Creatinine Clearance: 19.2 mL/min (A) (by C-G formula based on SCr of 4.85 mg/dL (H)). Liver Function Tests: Recent Labs  Lab 01/26/18 2247 01/28/18 0642  AST 15  --   ALT 9  --   ALKPHOS 130*  --   BILITOT 0.6  --   PROT 7.9  --   ALBUMIN 3.2* 2.4*    Recent Labs  Lab 01/26/18 2247  AMMONIA 13   Coagulation Profile: Recent Labs  Lab 01/27/18 0415 01/28/18 0642  INR 1.35 1.24   Cardiac Enzymes: Recent Labs  Lab 01/26/18 2247 01/27/18 0725 01/27/18 1249 01/27/18 2055  TROPONINI 0.11* 0.12* 0.09* 0.07*    Recent Results (from the past 240 hour(s))  Culture, blood (routine x  2)     Status: None (Preliminary result)   Collection Time: 01/26/18 10:47 PM  Result Value Ref Range Status   Specimen Description BLOOD RIGHT ARM  Final   Special Requests   Final    BOTTLES DRAWN AEROBIC AND ANAEROBIC ANA BCAV AEB [place]   Culture   Final    NO GROWTH 2 DAYS Performed at [facility], [address], [place], [place] [address]    Report Status PENDING  Incomplete  Culture, blood (routine x 2)     Status: None (Preliminary result)   Collection Time: 01/26/18 10:47 PM  Result Value Ref Range Status   Specimen Description BLOOD RIGHT ARM  Final   Special Requests   Final    BOTTLES DRAWN AEROBIC AND ANAEROBIC ANA BCAV AEB [place]   Culture   Final    NO  GROWTH 2 DAYS Performed at [facility], [address], [place], [place] [address]    Report Status PENDING  Incomplete         Radiology Studies: Dg Chest 2 View  Result Date: 01/26/2018 CLINICAL DATA:  Shortness of breath EXAM: CHEST - 2 VIEW COMPARISON:  01/09/2018, 12/04/2017 FINDINGS: Right-sided central venous catheter tip over the SVC. Bibasilar atelectasis or scarring. No pleural effusion. No focal consolidation. Stable cardiomediastinal silhouette with aortic atherosclerosis. No pneumothorax. IMPRESSION: No active cardiopulmonary disease. Stable scarring and/or atelectasis at both lung bases. Electronically Signed   By: Donavan Foil M.D.   On: 01/26/2018 23:23        Scheduled Meds: . budesonide (PULMICORT) nebulizer solution  0.5 mg Nebulization BID  . calcitRIOL  0.25 mcg Oral Q T,Th,Sa-HD  . calcium acetate  667 mg Oral QHS  . carvedilol  6.25 mg Oral BID WC  . Chlorhexidine Gluconate Cloth  6 each Topical Q0600  . Chlorhexidine Gluconate Cloth  6 each Topical Q0600  . clopidogrel  75 mg Oral Daily  . feeding supplement (NEPRO CARB STEADY)  237 mL Oral BID BM  . fluticasone furoate-vilanterol  1 puff Inhalation Q2000  . furosemide  40 mg Oral q morning - 10a  . ipratropium  0.5 mg Nebulization  TID  . levalbuterol  0.63 mg Nebulization TID  . mouth rinse  15 mL Mouth Rinse BID  . oxyCODONE      . oxyCODONE-acetaminophen      . pantoprazole  40 mg Oral BID  . vancomycin variable dose per unstable renal function (pharmacist dosing)   Does not apply See admin instructions  . Warfarin - Pharmacist Dosing Inpatient   Does not apply q1800   Continuous Infusions: . cefTAZidime (FORTAZ)  IV    . heparin 1,950 Units/hr (01/28/18 0951)     LOS: 1 day     Bonnielee Haff, MD [facility] Pager: [phone]  If 7PM-7AM, please contact night-coverage [email or web address] Password [email or web address] 01/28/2018, 10:24 AM

## 2018-01-28 NOTE — Progress Notes (Signed)
[facility]  Date of Admission:  01/26/2018             ASSESSMENT/PLAN  Aaron [name] continues to receive treatment for culture negative discitis and paraspinal abscess with vancomycin and ceftazidime and has received one dose of each medication. Per renal he no longer is needing dialysis.   1. Change vancomycin and ceftazidime to ciprofloxacin and Augmentin discharge  with treatment goal of 6 weeks of oral therapy. End date of therapy will be 03/11/18. 2. Check lumbar x-ray today.  3. Check inflammatory markers for treatment. 4. Plan for follow up in the ID office in 3 weeks or sooner if needed.   Principal Problem:   Sepsis due to undetermined organism [facility]) Active Problems:   UNSPECIFIED IRON DEFICIENCY ANEMIA   Essential hypertension   Esophageal reflux   Tobacco abuse   PAD (peripheral artery disease) (HCC)   Polycystic kidney disease   AF (paroxysmal atrial fibrillation) (HCC)   Hyperlipidemia   Paraspinal abscess (HCC)   Lumbar discitis   . budesonide (PULMICORT) nebulizer solution  0.5 mg Nebulization BID  . calcitRIOL  0.25 mcg Oral Q T,Th,Sa-HD  . calcium acetate  667 mg Oral QHS  . carvedilol  6.25 mg Oral BID WC  . Chlorhexidine Gluconate Cloth  6 each Topical Q0600  . Chlorhexidine Gluconate Cloth  6 each Topical Q0600  . clopidogrel  75 mg Oral Daily  . feeding supplement (NEPRO CARB STEADY)  237 mL Oral BID BM  . fluticasone furoate-vilanterol  1 puff Inhalation Q2000  . furosemide  40 mg Oral q morning - 10a  . ipratropium  0.5 mg Nebulization TID  . levalbuterol  0.63 mg Nebulization TID  . mouth rinse  15 mL Mouth Rinse BID  . oxyCODONE      . oxyCODONE-acetaminophen      . pantoprazole  40 mg Oral BID  . vancomycin variable dose per unstable renal function (pharmacist dosing)   Does not apply See admin instructions  . warfarin  5 mg Oral ONCE-1800  . Warfarin - Pharmacist Dosing Inpatient   Does not apply q1800     SUBJECTIVE:  Aaron [name] has been afebrile overnight with no leukocytosis. Blood cultures from 10/15 remain without growth.  Feeling much better today. No longer needing dialysis at this time. Denies fevers, chills or sweats. Ready to go home.   No Known Allergies   Review of Systems: Review of Systems  Constitutional: Negative for chills, fever and malaise/fatigue.  Respiratory: Negative for cough, shortness of breath and wheezing.   Cardiovascular: Negative for chest pain and leg swelling.  Gastrointestinal: Negative for abdominal pain, constipation, diarrhea, nausea and vomiting.  Musculoskeletal: Positive for back pain.  Skin: Negative for rash.  Neurological: Positive for weakness.      OBJECTIVE: Vitals:   01/28/18 0732 01/28/18 0755 01/28/18 0756 01/28/18 1000  BP: (!) 150/96     Pulse: 92 97    Resp: 18 16    Temp: 98.1 F (36.7 C)     TempSrc: Oral     SpO2: 97% 98% 98%   Weight:      Height:    6\' 4"  (1.93 m)   Body mass index is 22.81 kg/m.  Physical Exam  Constitutional: He is oriented to person, place, and time. He appears well-developed and well-nourished. No distress.  Cardiovascular: Normal rate, regular rhythm, normal heart sounds and intact distal pulses.  Pulmonary/Chest: Effort normal and breath sounds normal.  Musculoskeletal:  Lumbar exam unchanged from previous.  Neurological: He is alert and oriented to person, place, and time.  Skin: Skin is warm and dry.  Psychiatric: He has a normal mood and affect. Judgment and thought content normal.    Lab Results Lab Results  Component Value Date   WBC 8.8 01/28/2018   HGB 9.1 (L) 01/28/2018   HCT 29.5 (L) 01/28/2018   MCV 96.1 01/28/2018   PLT 261 01/28/2018    Lab Results  Component Value Date   CREATININE 4.85 (H) 01/28/2018   BUN 39 (H) 01/28/2018   NA 134 (L) 01/28/2018   K 4.6 01/28/2018   CL 103 01/28/2018   CO2 22 01/28/2018    Lab Results  Component Value Date   ALT 9  01/26/2018   AST 15 01/26/2018   ALKPHOS 130 (H) 01/26/2018   BILITOT 0.6 01/26/2018     Microbiology: Recent Results (from the past 240 hour(s))  Culture, blood (routine x 2)     Status: None (Preliminary result)   Collection Time: 01/26/18 10:47 PM  Result Value Ref Range Status   Specimen Description BLOOD RIGHT ARM  Final   Special Requests   Final    BOTTLES DRAWN AEROBIC AND ANAEROBIC ANA BCAV AEB [place]   Culture   Final    NO GROWTH 2 DAYS Performed at [facility], [address] [place], [place] [address]    Report Status PENDING  Incomplete  Culture, blood (routine x 2)     Status: None (Preliminary result)   Collection Time: 01/26/18 10:47 PM  Result Value Ref Range Status   Specimen Description BLOOD RIGHT ARM  Final   Special Requests   Final    BOTTLES DRAWN AEROBIC AND ANAEROBIC ANA BCAV AEB [place]   Culture   Final    NO GROWTH 2 DAYS Performed at [facility], [address], [place], [place] [address]    Report Status PENDING  Incomplete     Terri Piedra, NP [facility] [phone] Pager  01/28/2018  11:39 AM

## 2018-01-29 DIAGNOSIS — R509 Fever, unspecified: Secondary | ICD-10-CM

## 2018-01-29 LAB — RENAL FUNCTION PANEL
ANION GAP: 11 (ref 5–15)
Albumin: 2.4 g/dL — ABNORMAL LOW (ref 3.5–5.0)
BUN: 43 mg/dL — ABNORMAL HIGH (ref 8–23)
CALCIUM: 8.8 mg/dL — AB (ref 8.9–10.3)
CO2: 22 mmol/L (ref 22–32)
Chloride: 100 mmol/L (ref 98–111)
Creatinine, Ser: 4.96 mg/dL — ABNORMAL HIGH (ref 0.61–1.24)
GFR, EST AFRICAN AMERICAN: 13 mL/min — AB (ref >60)
GFR, EST NON AFRICAN AMERICAN: 11 mL/min — AB (ref >60)
Glucose, Bld: 116 mg/dL — ABNORMAL HIGH (ref 70–99)
Phosphorus: 5 mg/dL — ABNORMAL HIGH (ref 2.5–4.6)
Potassium: 4.6 mmol/L (ref 3.5–5.1)
SODIUM: 133 mmol/L — AB (ref 135–145)

## 2018-01-29 LAB — CBC
HCT: 28.4 % — ABNORMAL LOW (ref 39.0–52.0)
Hemoglobin: 8.4 g/dL — ABNORMAL LOW (ref 13.0–17.0)
MCH: 29.1 pg (ref 26.0–34.0)
MCHC: 29.6 g/dL — AB (ref 30.0–36.0)
MCV: 98.3 fL (ref 80.0–100.0)
Platelets: 244 10*3/uL (ref 150–400)
RBC: 2.89 MIL/uL — ABNORMAL LOW (ref 4.22–5.81)
RDW: 18.3 % — AB (ref 11.5–15.5)
WBC: 7.6 10*3/uL (ref 4.0–10.5)
nRBC: 0 % (ref 0.0–0.2)

## 2018-01-29 LAB — PROTIME-INR
INR: 1.18
PROTHROMBIN TIME: 14.9 s (ref 11.4–15.2)

## 2018-01-29 LAB — VANCOMYCIN, RANDOM: Vancomycin Rm: 18

## 2018-01-29 MED ORDER — OXYCODONE-ACETAMINOPHEN 10-325 MG PO TABS: 1.0000 | ORAL_TABLET | Freq: Four times a day (QID) | ORAL | 0 refills | Status: DC | PRN

## 2018-01-29 MED ORDER — CIPROFLOXACIN HCL 500 MG PO TABS
500.0000 mg | ORAL_TABLET | Freq: Every day | ORAL | Status: DC
  Administered 2018-01-29: 500 mg via ORAL
  Filled 2018-01-29: qty 1

## 2018-01-29 MED ORDER — FUROSEMIDE 80 MG PO TABS: 80.0000 mg | ORAL_TABLET | Freq: Every day | ORAL | 0 refills | Status: DC

## 2018-01-29 MED ORDER — CIPROFLOXACIN HCL 500 MG PO TABS: 500.0000 mg | ORAL_TABLET | Freq: Every day | ORAL | 0 refills | Status: AC

## 2018-01-29 MED ORDER — AMOXICILLIN-POT CLAVULANATE 500-125 MG PO TABS
500.0000 mg | ORAL_TABLET | Freq: Two times a day (BID) | ORAL | Status: DC
  Administered 2018-01-29: 500 mg via ORAL
  Filled 2018-01-29: qty 1

## 2018-01-29 MED ORDER — WARFARIN SODIUM 5 MG PO TABS: 5.0000 mg | ORAL_TABLET | Freq: Every day | ORAL | Status: DC

## 2018-01-29 MED ORDER — AMOXICILLIN-POT CLAVULANATE 500-125 MG PO TABS: 500.0000 mg | ORAL_TABLET | Freq: Two times a day (BID) | ORAL | 0 refills | Status: AC

## 2018-01-29 MED ORDER — WARFARIN SODIUM 5 MG PO TABS: 5.0000 mg | ORAL_TABLET | Freq: Every day | ORAL | 0 refills | Status: DC

## 2018-01-29 MED ORDER — CARVEDILOL 25 MG PO TABS: 25.0000 mg | ORAL_TABLET | Freq: Two times a day (BID) | ORAL | 0 refills | Status: DC

## 2018-01-29 NOTE — Discharge Instructions (Signed)
May remove bandage from neck tomorrow and shower.  Place band-aid over wound right chest daily after shower until healed.    Food Basics for Chronic Kidney Disease When your kidneys are not working well, they cannot remove waste and excess substances from your blood as effectively as they did before. This can lead to a buildup and imbalance of these substances, which can worsen kidney damage and affect how your body functions. Certain foods lead to a buildup of these substances in the body. By changing your diet as recommended by your diet and nutrition specialist (dietitian) or health care provider, you could help prevent further kidney damage and delay or prevent the need for dialysis. What are tips for following this plan? General instructions  Work with your health care provider and dietitian to develop a meal plan that is right for you. Foods you can eat, limit, or avoid will be different for each person depending on the stage of kidney disease and any other existing health conditions.  Talk with your health care provider about whether you should take a vitamin and mineral supplement.  Use standard measuring cups and spoons to measure servings of foods. Use a kitchen scale to measure portions of protein foods.  If directed by your health care provider, avoid drinking too much fluid. Measure and count all liquids, including water, ice, soups, flavored gelatin, and frozen desserts such as popsicles or ice cream. Reading food labels  Check the amount of sodium in foods. Choose foods that have less than 300 milligrams (mg) per serving.  Check the ingredient list for phosphorus or potassium-based additives or preservatives.  Check the amount of saturated and trans fat. Limit or avoid these fats as told by your dietitian. Shopping  Avoid buying foods that are: ? Processed, frozen, or prepackaged. ? Calcium-enriched or fortified.  Do not buy foods that have salt or sodium listed among the  first five ingredients.  Do not buy canned vegetables. Cooking  Replace animal proteins, such as meat, fish, eggs, or dairy, with plant proteins from beans, nuts, and soy. ? Use soy milk instead of cow's milk. ? Add beans or tofu to soups, casseroles, or pasta dishes instead of meat.  Soak vegetables, such as potatoes, before cooking to reduce potassium. To do this: ? Peel and cut into small pieces. ? Soak in warm water for at least 2 hours. For every 1 cup of vegetables, use 10 cups of water. ? Drain and rinse with warm water. ? Boil for at least 5 minutes. Meal planning  Limit the amount of protein from plant and animal sources you eat each day.  Do not add salt to food when cooking or before eating.  Eat meals and snacks at around the same time each day. If you have diabetes:  If you have diabetes (diabetes mellitus) and chronic kidney disease, it is important to keep your blood glucose in the target range recommended by your health care provider. Follow your diabetes management plan. This may include: ? Checking your blood glucose regularly. ? Taking oral medicines, insulin, or both. ? Exercising for at least 30 minutes on 5 or more days each week, or as told by your health care provider. ? Tracking how many servings of carbohydrates you eat at each meal.  You may be given specific guidelines on how much of certain foods and nutrients you may eat, depending on your stage of kidney disease and whether you have high blood pressure (hypertension). Follow your meal plan  as told by your dietitian. What nutrients should be limited? The items listed are not a complete list. Talk with your dietitian about what dietary choices are best for you. Potassium Potassium affects how steadily your heart beats. If too much potassium builds up in your blood, it can cause an irregular heartbeat or even a heart attack. You may need to eat less potassium, depending on your blood potassium levels and  the stage of kidney disease. Talk to your dietitian about how much potassium you may have each day. You may need to limit or avoid foods that are high in potassium, such as:  Milk and soy milk.  Fruits, such as bananas, papaya, apricots, nectarines, melon, prunes, raisins, kiwi, and oranges.  Vegetables, such as potatoes, sweet potatoes, yams, tomatoes, leafy greens, beets, okra, avocado, pumpkin, and winter squash.  White and lima beans.  Phosphorus Phosphorus is a mineral found in your bones. A balance between calcium and phosphorous is needed to build and maintain healthy bones. Too much phosphorus pulls calcium from your bones. This can make your bones weak and more likely to break. Too much phosphorus can also make your skin itch. You may need to eat less phosphorus depending on your blood phosphorus levels and the stage of kidney disease. Talk to your dietitian about how much potassium you may have each day. You may need to take medicine to lower your blood phosphorus levels if diet changes do not help. You may need to limit or avoid foods that are high in phosphorus, such as:  Milk and dairy products.  Dried beans and peas.  Tofu, soy milk, and other soy-based meat replacements.  Colas.  Nuts and peanut butter.  Meat, poultry, and fish.  Bran cereals and oatmeals.  Protein Protein helps you to make and keep muscle. It also helps in the repair of your bodys cells and tissues. One of the natural breakdown products of protein is a waste product called urea. When your kidneys are not working properly, they cannot remove wastes, such as urea, like they did before you developed chronic kidney disease. Reducing how much protein you eat can help prevent a buildup of urea in your blood. Depending on your stage of kidney disease, you may need to limit foods that are high in protein. Sources of animal protein include:  Meat (all types).  Fish and  seafood.  Poultry.  Eggs.  Dairy.  Other protein foods include:  Beans and legumes.  Nuts and nut butter.  Soy and tofu.  Sodium Sodium, which is found in salt, helps maintain a healthy balance of fluids in your body. Too much sodium can increase your blood pressure and have a negative effect on the function of your heart and lungs. Too much sodium can also cause your body to retain too much fluid, making your kidneys work harder. Most people should have less than 2,300 milligrams (mg) of sodium each day. If you have hypertension, you may need to limit your sodium to 1,500 mg each day. Talk to your dietitian about how much sodium you may have each day. You may need to limit or avoid foods that are high in sodium, such as:  Salt seasonings.  Soy sauce.  Cured and processed meats.  Salted crackers and snack foods.  Fast food.  Canned soups and most canned foods.  Pickled foods.  Vegetable juice.  Boxed mixes or ready-to-eat boxed meals and side dishes.  Bottled dressings, sauces, and marinades.  Summary  Chronic kidney  disease can lead to a buildup and imbalance of waste and excess substances in the body. Certain foods lead to a buildup of these substances. By adjusting your intake of these foods, you could help prevent more kidney damage and delay or prevent the need for dialysis.  Food adjustments are different for each person with chronic kidney disease. Work with a dietitian to set up nutrient goals and a meal plan that is right for you.  If you have diabetes and chronic kidney disease, it is important to keep your blood glucose in the target range recommended by your health care provider. This information is not intended to replace advice given to you by your health care provider. Make sure you discuss any questions you have with your health care provider. Document Released: 06/21/2002 Document Revised: 03/26/2016 Document Reviewed: 03/26/2016 Elsevier Interactive  Patient Education  [employer].

## 2018-01-29 NOTE — Progress Notes (Signed)
ANTICOAGULATION + ANTIBIOTIC CONSULT NOTE - Follow Up Consult  Pharmacy Consult for Coumadin; Vanc and Ceftazidime -> Augmentin and Cipro Indication: atrial fibrillation and paraspinal abscess/discitis   No Known Allergies  Patient Measurements: Height: 6\' 4"  (193 cm) Weight: 187 lb 6.3 oz (85 kg) IBW/kg (Calculated) : 86.8  Vital Signs: Temp: 97.7 F (36.5 C) (10/18 0754) Temp Source: Oral (10/18 0754) BP: 146/122 (10/18 0754) Pulse Rate: 117 (10/18 0754)  Labs: Recent Labs    01/26/18 2247 01/27/18 0415 01/27/18 0725 01/27/18 1249 01/27/18 2055 01/28/18 0642 01/29/18 0533  HGB 9.9* 9.0*  --   --   --  9.1* 8.4*  HCT 32.4* 28.6*  --   --   --  29.5* 28.4*  PLT 262 245  --   --   --  261 244  LABPROT  --  16.5*  --   --   --  15.5* 14.9  INR  --  1.35  --   --   --  1.24 1.18  HEPARINUNFRC  --   --   --  0.18* 0.33 0.29*  --   CREATININE 4.51*  --   --   --   --  4.85* 4.96*  TROPONINI 0.11*  --  0.12* 0.09* 0.07*  --   --     Estimated Creatinine Clearance: 18.8 mL/min (A) (by C-G formula based on SCr of 4.96 mg/dL (H)).  Assessment:  62 yr old male on anticoagulation for atrial fibrillation.  Previously on Coumadin but not taking as prescribed.  INR 1.35 on 10/16.  Coumadin was resumed. Was on Heparin bridge 10/16>10/17.   INR only 1.18 today after Coumadin 5 mg x 2 doses on 10/16 (first dose ~3am then repeated at ~6pm), then 5 mg last night.   Previously on Coumadin 5 mg daily except 2.5 mg on Fridays per outpatient anticoag note.    On day #3 restart Vancomycin and Ceftazidime for hx Serratia and Enterococcus in wound culture 12/06/17 and Bacillus in 9/29 spinal aspirate culture.   Per 01/12/18 ID plan, Vanc and Ceftazidime were to continue for 6 weeks, through 02/21/18.  Left AMA on 01/13/18 and did not receive antibiotics after discharge due to scheduled with dialysis and missed outpatient dialysis sessions. Creatinine in 4s, ok to come off dialysis per renal and TCD  removed yesterday.   Now planning discharge on Augmentin and Cipro.  Goal of Therapy:  INR 2-3 Appropriate Cipro and Augmentin doses for renal function Monitor platelets by anticoagulation protocol: Yes   Plan:   IV antibiotics stopped.  Begin Cipro 500 mg PO daily and Augmentin 500 mg PO BID.  If renal function improves to crcl >30 ml/min, would need to increase Cipro to 500 mg BID and Augmentin to 875 mg BID. Would correlate with creatinine ~3.0 or less.  For discharge today. Discussed Coumadin dosing with Dr. [place] Pink.  Planning Coumadin 5 mg daily for now, check PT/INR next week.  Daily PT/INR while in the hospital.  [name], [place] Pager: [phone] or phone: [phone] 01/29/2018,10:03 AM

## 2018-01-29 NOTE — Discharge Summary (Signed)
[facility]  Physician Discharge Summary   Patient ID: Aaron [name] MRN: [id number] DOB/AGE: [birth year] 62 y.o.  Admit date: 01/26/2018 Discharge date: 01/29/2018  PCP: [facility]  DISCHARGE DIAGNOSES:  Sepsis secondary to lumbar discitis Lumbar discitis Chronic kidney disease stage V   RECOMMENDATIONS FOR OUTPATIENT FOLLOW UP: 1. Follow-up with the ID clinic 2. Nephrology to arrange outpatient follow-up 3. Follow-up with PCP next week for INR check  DISCHARGE CONDITION: fair  Diet recommendation: Renal diet  Filed Weights   01/27/18 0251 01/27/18 2210 01/28/18 2024  Weight: 85 kg 85 kg 85 kg    INITIAL HISTORY: 62 year old with a history of anxiety, osteoarthritis, atrial fibrillation, CAD, chronic back pain, COPD, ESRD on hemodialysis, GERD, hepatitis C, essential hypertension, PAD, medical noncompliance came to the ER with complaints of difficulty breathing and generalized weakness.  Patient left AMA from [facility] 2 weeks ago after being diagnosed and partially treated for his paraspinal abscess.  Patient stated he has not been dialyzed since then.  Upon admission his creatinine was 4.5, BUN 42.  His chest x-ray did not show any acute pulmonary edema and his breathing status was okay.  He was noted to have low-grade fever.  Consultations:  Infectious disease  Nephrology  Procedures:  Removal of dialysis catheter   HOSPITAL COURSE:   Sepsis likely secondary to paraspinal abscess status post drainage Lumbar discitis Medical noncompliance Sepsis physiology appears to have resolved.  He is noted to be afebrile.    Seen by infectious disease.  Patient was initially placed on vancomycin and ceftazidime.  Bacillus species was isolated from the abscess during the previous hospitalization.  Due to his compliance issues ID has decided to treat him with a prolonged course of oral antibiotics.  Ciprofloxacin and Augmentin has been  recommended.  This will be given for 6 weeks.  ID to follow-up in the clinic in 3 weeks.  He will need a MRI of his lumbar spine prior to discontinuation of antibiotic treatment. No vegetation was seen on TEE done on October 1.  He is neurologically intact.  His back pain and lower extremity heaviness has improved.  Elevated troponin Mildly elevated troponin likely secondary to renal disease fluid overload demand ischemia.  Patient denies any chest pain.  EKG did not show any ischemic changes.  No further work-up at this time.   Chronic kidney disease stage V/previously ESRD but taken off of dialysis Patient with known diagnosis of end-stage renal disease on hemodialysis.  However patient has been noncompliant with hemodialysis.  He mentioned that he has been making a lot of urine recently.  Seen by nephrology.  He is thought to have chronic kidney disease stage V.  He is to be monitored off of dialysis.  Dialysis catheter was removed.  Nephrology to arrange outpatient follow-up.   Anemia, iron deficiency and also chronic disease Hemoglobin remains stable.  Iron was 45 back in May.  No evidence of overt bleeding.   Essential hypertension Continue home medications  GERD Protonix 40 mg twice daily  Peripheral arterial disease Continue Plavix 75 mg daily. Not sure why he is not on statin.  This can be addressed in the outpatient setting.  Atrial fibrillation, paroxysmal Atrial fibrillation with RVR, one episode.  Treated with Lopressor 5 mg IV once Heart rate remains well controlled.  Continue beta-blocker.  It appears the patient has been on warfarin previously.  He left AGAINST MEDICAL ADVICE on October 2.  He has been noncompliant  with his dialysis and with outpatient management along with the antibiotic management.  This makes me wonder if he is compliant with his anticoagulation as well.  His INR was subtherapeutic at admission.    Patient to be continued on warfarin.  He should  follow-up with his outpatient providers to have a PT/INR checked next week.  Hyperlipidemia Not on statin.  No recent lipid panel in our system.  This can be addressed by outpatient providers.    Overall stable.  Okay for discharge home today.  Patient has been up and about, ambulating without any difficulties.     PERTINENT LABS:  The results of significant diagnostics from this hospitalization (including imaging, microbiology, ancillary and laboratory) are listed below for reference.    Microbiology: Recent Results (from the past 240 hour(s))  Culture, blood (routine x 2)     Status: None (Preliminary result)   Collection Time: 01/26/18 10:47 PM  Result Value Ref Range Status   Specimen Description BLOOD RIGHT ARM  Final   Special Requests   Final    BOTTLES DRAWN AEROBIC AND ANAEROBIC ANA BCAV AEB [place]   Culture   Final    NO GROWTH 3 DAYS Performed at [facility], [address], [place], [place] [address]    Report Status PENDING  Incomplete  Culture, blood (routine x 2)     Status: None (Preliminary result)   Collection Time: 01/26/18 10:47 PM  Result Value Ref Range Status   Specimen Description BLOOD RIGHT ARM  Final   Special Requests   Final    BOTTLES DRAWN AEROBIC AND ANAEROBIC ANA BCAV AEB [place]   Culture   Final    NO GROWTH 3 DAYS Performed at [facility], [address], [place], [place] [address]    Report Status PENDING  Incomplete  Urine culture     Status: None   Collection Time: 01/27/18  6:35 PM  Result Value Ref Range Status   Specimen Description   Final    URINE, CLEAN CATCH Performed at [facility], [address], [place], [place] [address]    Special Requests   Final    Normal Performed at [facility], [address] [place], [place] [address]    Culture   Final    NO GROWTH Performed at [facility], [place] [address] [place], [place] [address]    Report Status 01/28/2018 FINAL  Final     Labs: Basic Metabolic Panel: Recent  Labs  Lab 01/26/18 2247 01/28/18 0642 01/29/18 0533  NA 133* 134* 133*  K 4.6 4.6 4.6  CL 103 103 100  CO2 20* 22 22  GLUCOSE 100* 113* 116*  BUN 42* 39* 43*  CREATININE 4.51* 4.85* 4.96*  CALCIUM 9.2 8.6* 8.8*  MG  --  1.8  --   PHOS  --  5.6*  5.5* 5.0*   Liver Function Tests: Recent Labs  Lab 01/26/18 2247 01/28/18 0642 01/29/18 0533  AST 15  --   --   ALT 9  --   --   ALKPHOS 130*  --   --   BILITOT 0.6  --   --   PROT 7.9  --   --   ALBUMIN 3.2* 2.4* 2.4*    Recent Labs  Lab 01/26/18 2247  AMMONIA 13   CBC: Recent Labs  Lab 01/26/18 2247 01/27/18 0415 01/28/18 0642 01/29/18 0533  WBC 10.6* 8.5 8.8 7.6  NEUTROABS 9.0* 6.6 6.7  --  HGB 9.9* 9.0* 9.1* 8.4*  HCT 32.4* 28.6* 29.5* 28.4*  MCV 98.5 96.3 96.1 98.3  PLT 262 245 261 244   Cardiac Enzymes: Recent Labs  Lab 01/26/18 2247 01/27/18 0725 01/27/18 1249 01/27/18 2055  TROPONINI 0.11* 0.12* 0.09* 0.07*   BNP: BNP (last 3 results) Recent Labs    02/25/17 1229 01/26/18 2247  BNP 110.0* 600.0*    IMAGING STUDIES Dg Chest 2 View  Result Date: 01/26/2018 CLINICAL DATA:  Shortness of breath EXAM: CHEST - 2 VIEW COMPARISON:  01/09/2018, 12/04/2017 FINDINGS: Right-sided central venous catheter tip over the SVC. Bibasilar atelectasis or scarring. No pleural effusion. No focal consolidation. Stable cardiomediastinal silhouette with aortic atherosclerosis. No pneumothorax. IMPRESSION: No active cardiopulmonary disease. Stable scarring and/or atelectasis at both lung bases. Electronically Signed   By: Donavan Foil M.D.   On: 01/26/2018 23:23   Dg Chest 2 View  Result Date: 01/09/2018 CLINICAL DATA:  Short of breath EXAM: CHEST - 2 VIEW COMPARISON:  12/04/2017 FINDINGS: Right-sided central venous catheter tip over the SVC. Bilateral hyperinflation. Non inclusion of the CP angles on the lateral view. Streaky atelectasis or scarring at the bases. Stable borderline cardiomegaly. No pneumothorax.  IMPRESSION: 1. Non inclusion of the lung bases on lateral view. 2. Streaky atelectasis or scarring at the bases. Electronically Signed   By: Donavan Foil M.D.   On: 01/09/2018 16:16   Dg Lumbar Spine 2-3 Views  Result Date: 01/28/2018 CLINICAL DATA:  Discitis EXAM: LUMBAR SPINE - 2-3 VIEW COMPARISON:  MRI from 01/06/2018 FINDINGS: Five lumbar type vertebral bodies are well visualized. Vertebral body height is well maintained at L1, L3-L4 and L5. Interbody fusion at L2-3 and L3-4 is seen. Increase kyphosis at L1-2 is noted related to interval superior endplate collapse at L2. MRI may be helpful for further evaluation. Multilevel osteophytic changes are noted. Diffuse vascular calcifications with right iliac stenting are noted. IMPRESSION: Progressive kyphosis at L1-2 related to superior endplate compression of L2. MRI may be helpful for further evaluation. Electronically Signed   By: Inez Catalina M.D.   On: 01/28/2018 14:12   Ct Aspiration  Result Date: 01/10/2018 INDICATION: L1-2 and L4-5 discitis.  Left L2 psoas small abscess. EXAM: CT-GUIDED ASPIRATION MEDICATIONS: The patient is currently admitted to the hospital and receiving intravenous antibiotics. The antibiotics were administered within an appropriate time frame prior to the initiation of the procedure. ANESTHESIA/SEDATION: Fentanyl 50 mcg IV; Versed 1.5 mg IV Moderate Sedation Time:  10 minutes The patient was continuously monitored during the procedure by the interventional radiology nurse under my direct supervision. COMPLICATIONS: None immediate. PROCEDURE: Informed written consent was obtained from the patient after a thorough discussion of the procedural risks, benefits and alternatives. All questions were addressed. Maximal Sterile Barrier Technique was utilized including caps, mask, sterile gowns, sterile gloves, sterile drape, hand hygiene and skin antiseptic. A timeout was performed prior to the initiation of the procedure. The back was  prepped and draped in a sterile fashion. 1% lidocaine was utilized for local anesthesia. Under CT guidance, an 18 gauge needle was inserted into the left L2 paraspinal fluid collection. Aspiration yielded 2 drops bloody fluid. FINDINGS: Imaging demonstrates needle placement in the left L2 paraspinal fluid collection. IMPRESSION: Successful left paraspinal L2 aspiration yielding 2 drops bloody fluid. Electronically Signed   By: Marybelle Killings M.D.   On: 01/10/2018 16:00    DISCHARGE EXAMINATION: Vitals:   01/28/18 2014 01/28/18 2024 01/29/18 0754 01/29/18 0855  BP:  (!) 148/105 Marland Kitchen)  146/122   Pulse:  75 (!) 117   Resp:  18 18   Temp:  98.6 F (37 C) 97.7 F (36.5 C)   TempSrc:  Oral Oral   SpO2: 100% 98% 99% 98%  Weight:  85 kg    Height:       General appearance: alert, cooperative, appears stated age and no distress Resp: clear to auscultation bilaterally Cardio: regular rate and rhythm, S1, S2 normal, no murmur, click, rub or gallop GI: soft, non-tender; bowel sounds normal; no masses,  no organomegaly No focal neurological deficits  DISPOSITION: Home  Discharge Instructions    Call MD for:  difficulty breathing, headache or visual disturbances   Complete by:  As directed    Call MD for:  extreme fatigue   Complete by:  As directed    Call MD for:  hives   Complete by:  As directed    Call MD for:  persistant dizziness or light-headedness   Complete by:  As directed    Call MD for:  persistant nausea and vomiting   Complete by:  As directed    Call MD for:  severe uncontrolled pain   Complete by:  As directed    Call MD for:  temperature >100.4   Complete by:  As directed    Discharge instructions   Complete by:  As directed    Please be sure to follow-up with your outpatient providers next week.  You will need to have a PT/INR to be checked next week to make sure your Coumadin dose is appropriate.  Please take your antibiotics as prescribed.  The kidney doctor will arrange  for outpatient follow-up.  Seek attention immediately if you develop high fevers, shortness of breath, worsening back pain or lower extremity weakness.  You were cared for by a hospitalist during your hospital stay. If you have any questions about your discharge medications or the care you received while you were in the hospital after you are discharged, you can call the unit and asked to speak with the hospitalist on call if the hospitalist that took care of you is not available. Once you are discharged, your primary care physician will handle any further medical issues. Please note that NO REFILLS for any discharge medications will be authorized once you are discharged, as it is imperative that you return to your primary care physician (or establish a relationship with a primary care physician if you do not have one) for your aftercare needs so that they can reassess your need for medications and monitor your lab values. If you do not have a primary care physician, you can call [phone] for a physician referral.   Increase activity slowly   Complete by:  As directed         Allergies as of 01/29/2018   No Known Allergies     Medication List    STOP taking these medications   calcitRIOL 0.25 MCG capsule Commonly known as:  ROCALTROL   metoprolol succinate 25 MG 24 hr tablet Commonly known as:  TOPROL-XL     TAKE these medications   amoxicillin-clavulanate 500-125 MG tablet Commonly known as:  AUGMENTIN Take 1 tablet (500 mg total) by mouth every 12 (twelve) hours.   BREO ELLIPTA 200-25 MCG/INH Aepb Generic drug:  fluticasone furoate-vilanterol Inhale 1 puff into the lungs every evening.   calcium acetate 667 MG capsule Commonly known as:  PHOSLO Take 667 mg by mouth at bedtime. Prescribed three times daily  with meals   carvedilol 25 MG tablet Commonly known as:  COREG Take 1 tablet (25 mg total) by mouth 2 (two) times daily. What changed:    when to take this  additional  instructions   cholecalciferol 1000 units tablet Commonly known as:  VITAMIN D Take 2,000 Units by mouth daily.   ciprofloxacin 500 MG tablet Commonly known as:  CIPRO Take 1 tablet (500 mg total) by mouth daily. Start taking on:  01/30/2018   clopidogrel 75 MG tablet Commonly known as:  PLAVIX Take 75 mg by mouth daily.   DIALYVITE 800 0.8 MG Tabs Take 1 tablet by mouth daily.   feeding supplement (NEPRO CARB STEADY) Liqd Take 237 mLs by mouth 2 (two) times daily between meals.   furosemide 80 MG tablet Commonly known as:  LASIX Take 1 tablet (80 mg total) by mouth daily. Start taking on:  01/30/2018 What changed:    medication strength  how much to take   oxyCODONE-acetaminophen 10-325 MG tablet Commonly known as:  PERCOCET Take 1 tablet by mouth every 6 (six) hours as needed for pain.   pantoprazole 40 MG tablet Commonly known as:  PROTONIX Take 1 tablet (40 mg total) by mouth 2 (two) times daily.   warfarin 5 MG tablet Commonly known as:  COUMADIN Take as directed. If you are unsure how to take this medication, talk to your nurse or doctor. Original instructions:  Take 1 tablet (5 mg total) by mouth daily at 6 PM.        Follow-up Information    [place], FNP Follow up.   Specialties:  Family Medicine, Infectious Diseases Why:  11/6 at 3:00 pm. If you are not able to make this time please call the office to reschedule. Contact information: [address] [place] [address] [place], [facility]. Schedule an appointment as soon as possible for a visit in 1 week(s).   Contact information: [place] [address] [phone]           TOTAL DISCHARGE TIME: 35 mins  Bonnielee Haff  [facility] Pager [phone]  01/29/2018, 1:04 PM

## 2018-01-29 NOTE — Progress Notes (Signed)
Patient discharged to home. After visit summary reviewed and patient capable of re verbalizing medications and follow up appointments. Pt remains stable. No signs and symptoms of distress. Educated to return to ER in the event of SOB, dizziness, chest pain, or fainting. Rini Moffit, RN  

## 2018-01-31 ENCOUNTER — Emergency Department (HOSPITAL_COMMUNITY): Payer: 59

## 2018-01-31 ENCOUNTER — Other Ambulatory Visit: Payer: Self-pay

## 2018-01-31 ENCOUNTER — Inpatient Hospital Stay (HOSPITAL_COMMUNITY)
Admission: EM | Admit: 2018-01-31 | Discharge: 2018-02-08 | DRG: 917 | Disposition: A | Discharge status: Home / Self Care | Payer: 59 | Attending: Internal Medicine | Admitting: Internal Medicine

## 2018-01-31 DIAGNOSIS — D631 Anemia in chronic kidney disease: Secondary | ICD-10-CM | POA: Diagnosis present

## 2018-01-31 DIAGNOSIS — K219 Gastro-esophageal reflux disease without esophagitis: Secondary | ICD-10-CM | POA: Diagnosis present

## 2018-01-31 DIAGNOSIS — E872 Acidosis: Secondary | ICD-10-CM | POA: Diagnosis present

## 2018-01-31 DIAGNOSIS — I2510 Atherosclerotic heart disease of native coronary artery without angina pectoris: Secondary | ICD-10-CM | POA: Diagnosis present

## 2018-01-31 DIAGNOSIS — Z79891 Long term (current) use of opiate analgesic: Secondary | ICD-10-CM

## 2018-01-31 DIAGNOSIS — Z9115 Patient's noncompliance with renal dialysis: Secondary | ICD-10-CM

## 2018-01-31 DIAGNOSIS — J441 Chronic obstructive pulmonary disease with (acute) exacerbation: Secondary | ICD-10-CM | POA: Diagnosis not present

## 2018-01-31 DIAGNOSIS — G8929 Other chronic pain: Secondary | ICD-10-CM | POA: Diagnosis present

## 2018-01-31 DIAGNOSIS — Z992 Dependence on renal dialysis: Secondary | ICD-10-CM

## 2018-01-31 DIAGNOSIS — R4182 Altered mental status, unspecified: Secondary | ICD-10-CM | POA: Diagnosis present

## 2018-01-31 DIAGNOSIS — M4646 Discitis, unspecified, lumbar region: Secondary | ICD-10-CM | POA: Diagnosis present

## 2018-01-31 DIAGNOSIS — Z8249 Family history of ischemic heart disease and other diseases of the circulatory system: Secondary | ICD-10-CM

## 2018-01-31 DIAGNOSIS — N179 Acute kidney failure, unspecified: Secondary | ICD-10-CM | POA: Diagnosis present

## 2018-01-31 DIAGNOSIS — T402X4A Poisoning by other opioids, undetermined, initial encounter: Principal | ICD-10-CM | POA: Diagnosis present

## 2018-01-31 DIAGNOSIS — I120 Hypertensive chronic kidney disease with stage 5 chronic kidney disease or end stage renal disease: Secondary | ICD-10-CM | POA: Diagnosis present

## 2018-01-31 DIAGNOSIS — N186 End stage renal disease: Secondary | ICD-10-CM

## 2018-01-31 DIAGNOSIS — F17210 Nicotine dependence, cigarettes, uncomplicated: Secondary | ICD-10-CM | POA: Diagnosis present

## 2018-01-31 DIAGNOSIS — J9601 Acute respiratory failure with hypoxia: Secondary | ICD-10-CM | POA: Diagnosis not present

## 2018-01-31 DIAGNOSIS — Z7902 Long term (current) use of antithrombotics/antiplatelets: Secondary | ICD-10-CM

## 2018-01-31 DIAGNOSIS — Z8041 Family history of malignant neoplasm of ovary: Secondary | ICD-10-CM

## 2018-01-31 DIAGNOSIS — J9600 Acute respiratory failure, unspecified whether with hypoxia or hypercapnia: Secondary | ICD-10-CM | POA: Diagnosis present

## 2018-01-31 DIAGNOSIS — N2581 Secondary hyperparathyroidism of renal origin: Secondary | ICD-10-CM | POA: Diagnosis present

## 2018-01-31 DIAGNOSIS — Z811 Family history of alcohol abuse and dependence: Secondary | ICD-10-CM

## 2018-01-31 DIAGNOSIS — F419 Anxiety disorder, unspecified: Secondary | ICD-10-CM | POA: Diagnosis present

## 2018-01-31 DIAGNOSIS — G9340 Encephalopathy, unspecified: Secondary | ICD-10-CM | POA: Diagnosis not present

## 2018-01-31 DIAGNOSIS — Z8051 Family history of malignant neoplasm of kidney: Secondary | ICD-10-CM

## 2018-01-31 DIAGNOSIS — Z79899 Other long term (current) drug therapy: Secondary | ICD-10-CM

## 2018-01-31 DIAGNOSIS — I482 Chronic atrial fibrillation, unspecified: Secondary | ICD-10-CM | POA: Diagnosis not present

## 2018-01-31 DIAGNOSIS — Z800 Family history of malignant neoplasm of digestive organs: Secondary | ICD-10-CM

## 2018-01-31 DIAGNOSIS — E785 Hyperlipidemia, unspecified: Secondary | ICD-10-CM | POA: Diagnosis present

## 2018-01-31 DIAGNOSIS — B1920 Unspecified viral hepatitis C without hepatic coma: Secondary | ICD-10-CM | POA: Diagnosis present

## 2018-01-31 DIAGNOSIS — I252 Old myocardial infarction: Secondary | ICD-10-CM | POA: Diagnosis not present

## 2018-01-31 DIAGNOSIS — R0602 Shortness of breath: Secondary | ICD-10-CM

## 2018-01-31 DIAGNOSIS — G92 Toxic encephalopathy: Secondary | ICD-10-CM | POA: Diagnosis present

## 2018-01-31 DIAGNOSIS — Z7901 Long term (current) use of anticoagulants: Secondary | ICD-10-CM

## 2018-01-31 DIAGNOSIS — M79601 Pain in right arm: Secondary | ICD-10-CM

## 2018-01-31 DIAGNOSIS — N185 Chronic kidney disease, stage 5: Secondary | ICD-10-CM

## 2018-01-31 DIAGNOSIS — Z9119 Patient's noncompliance with other medical treatment and regimen: Secondary | ICD-10-CM

## 2018-01-31 DIAGNOSIS — R4020 Unspecified coma: Secondary | ICD-10-CM

## 2018-01-31 DIAGNOSIS — Z87442 Personal history of urinary calculi: Secondary | ICD-10-CM

## 2018-01-31 DIAGNOSIS — Z981 Arthrodesis status: Secondary | ICD-10-CM

## 2018-01-31 LAB — COMPREHENSIVE METABOLIC PANEL
ALT: 9 U/L (ref 0–44)
AST: 21 U/L (ref 15–41)
Albumin: 3.1 g/dL — ABNORMAL LOW (ref 3.5–5.0)
Alkaline Phosphatase: 116 U/L (ref 38–126)
Anion gap: 10 (ref 5–15)
BUN: 49 mg/dL — AB (ref 8–23)
CHLORIDE: 103 mmol/L (ref 98–111)
CO2: 21 mmol/L — AB (ref 22–32)
CREATININE: 4.82 mg/dL — AB (ref 0.61–1.24)
Calcium: 9.1 mg/dL (ref 8.9–10.3)
GFR calc Af Amer: 14 mL/min — ABNORMAL LOW (ref >60)
GFR, EST NON AFRICAN AMERICAN: 12 mL/min — AB (ref >60)
Glucose, Bld: 137 mg/dL — ABNORMAL HIGH (ref 70–99)
POTASSIUM: 4.6 mmol/L (ref 3.5–5.1)
Sodium: 134 mmol/L — ABNORMAL LOW (ref 135–145)
Total Bilirubin: 0.8 mg/dL (ref 0.3–1.2)
Total Protein: 7.6 g/dL (ref 6.5–8.1)

## 2018-01-31 LAB — CBC WITH DIFFERENTIAL/PLATELET
ABS IMMATURE GRANULOCYTES: 0.07 10*3/uL (ref 0.00–0.07)
BASOS ABS: 0 10*3/uL (ref 0.0–0.1)
BASOS PCT: 0 %
EOS ABS: 0 10*3/uL (ref 0.0–0.5)
Eosinophils Relative: 0 %
HEMATOCRIT: 33 % — AB (ref 39.0–52.0)
Hemoglobin: 10.3 g/dL — ABNORMAL LOW (ref 13.0–17.0)
IMMATURE GRANULOCYTES: 0 %
LYMPHS ABS: 0.4 10*3/uL — AB (ref 0.7–4.0)
Lymphocytes Relative: 2 %
MCH: 30.1 pg (ref 26.0–34.0)
MCHC: 31.2 g/dL (ref 30.0–36.0)
MCV: 96.5 fL (ref 80.0–100.0)
MONOS PCT: 7 %
Monocytes Absolute: 1.2 10*3/uL — ABNORMAL HIGH (ref 0.1–1.0)
NEUTROS ABS: 15.5 10*3/uL — AB (ref 1.7–7.7)
NEUTROS PCT: 91 %
NRBC: 0 % (ref 0.0–0.2)
PLATELETS: 340 10*3/uL (ref 150–400)
RBC: 3.42 MIL/uL — ABNORMAL LOW (ref 4.22–5.81)
RDW: 17.9 % — AB (ref 11.5–15.5)
WBC: 17.1 10*3/uL — ABNORMAL HIGH (ref 4.0–10.5)

## 2018-01-31 LAB — RAPID URINE DRUG SCREEN, HOSP PERFORMED
Amphetamines: NOT DETECTED
BENZODIAZEPINES: NOT DETECTED
Barbiturates: NOT DETECTED
COCAINE: NOT DETECTED
OPIATES: POSITIVE — AB
Tetrahydrocannabinol: NOT DETECTED

## 2018-01-31 LAB — BLOOD GAS, ARTERIAL
ACID-BASE DEFICIT: 5.4 mmol/L — AB (ref 0.0–2.0)
Bicarbonate: 19.7 mmol/L — ABNORMAL LOW (ref 20.0–28.0)
DRAWN BY: 221791
FIO2: 100
O2 SAT: 87.8 %
PEEP: 5 cmH2O
PH ART: 7.312 — AB (ref 7.350–7.450)
RATE: 22 resp/min
VT: 500 mL
pCO2 arterial: 40.3 mmHg (ref 32.0–48.0)
pO2, Arterial: 63.7 mmHg — ABNORMAL LOW (ref 83.0–108.0)

## 2018-01-31 LAB — URINALYSIS, ROUTINE W REFLEX MICROSCOPIC
BILIRUBIN URINE: NEGATIVE
Glucose, UA: NEGATIVE mg/dL
Ketones, ur: NEGATIVE mg/dL
Leukocytes, UA: NEGATIVE
Nitrite: NEGATIVE
PH: 5 (ref 5.0–8.0)
Protein, ur: 30 mg/dL — AB
Specific Gravity, Urine: 1.012 (ref 1.005–1.030)

## 2018-01-31 LAB — I-STAT CHEM 8, ED
BUN: 69 mg/dL — AB (ref 8–23)
CHLORIDE: 105 mmol/L (ref 98–111)
CREATININE: 5.3 mg/dL — AB (ref 0.61–1.24)
Calcium, Ion: 1.16 mmol/L (ref 1.15–1.40)
GLUCOSE: 121 mg/dL — AB (ref 70–99)
HEMATOCRIT: 33 % — AB (ref 39.0–52.0)
HEMOGLOBIN: 11.2 g/dL — AB (ref 13.0–17.0)
POTASSIUM: 5.8 mmol/L — AB (ref 3.5–5.1)
Sodium: 136 mmol/L (ref 135–145)
TCO2: 24 mmol/L (ref 22–32)

## 2018-01-31 LAB — PROTIME-INR
INR: 1.37
Prothrombin Time: 16.7 seconds — ABNORMAL HIGH (ref 11.4–15.2)

## 2018-01-31 LAB — CULTURE, BLOOD (ROUTINE X 2)
CULTURE: NO GROWTH
CULTURE: NO GROWTH

## 2018-01-31 LAB — APTT: APTT: 32 s (ref 24–36)

## 2018-01-31 LAB — I-STAT CG4 LACTIC ACID, ED: LACTIC ACID, VENOUS: 2.17 mmol/L — AB (ref 0.5–1.9)

## 2018-01-31 LAB — CARBOXYHEMOGLOBIN - COOX: Carboxyhemoglobin: 1.8 % — ABNORMAL HIGH (ref 0.5–1.5)

## 2018-01-31 LAB — CK: Total CK: 139 U/L (ref 49–397)

## 2018-01-31 MED ORDER — HEPARIN BOLUS VIA INFUSION
4000.0000 [IU] | Freq: Once | INTRAVENOUS | Status: AC
  Administered 2018-01-31: 4000 [IU] via INTRAVENOUS

## 2018-01-31 MED ORDER — ETOMIDATE 2 MG/ML IV SOLN
INTRAVENOUS | Status: AC | PRN
  Administered 2018-01-31: 20 mg via INTRAVENOUS

## 2018-01-31 MED ORDER — HEPARIN (PORCINE) IN NACL 100-0.45 UNIT/ML-% IJ SOLN
2300.0000 [IU]/h | INTRAMUSCULAR | Status: DC
  Administered 2018-01-31: 1000 [IU]/h via INTRAVENOUS
  Administered 2018-02-01: 1700 [IU]/h via INTRAVENOUS
  Administered 2018-02-02: 2300 [IU]/h via INTRAVENOUS
  Administered 2018-02-02: 1950 [IU]/h via INTRAVENOUS
  Filled 2018-01-31 (×4): qty 250

## 2018-01-31 MED ORDER — ROCURONIUM BROMIDE 50 MG/5ML IV SOLN
INTRAVENOUS | Status: AC | PRN
  Administered 2018-01-31: 100 mg via INTRAVENOUS

## 2018-01-31 MED ORDER — SODIUM CHLORIDE 0.9 % IV SOLN
Freq: Once | INTRAVENOUS | Status: AC
  Administered 2018-01-31: 18:00:00 via INTRAVENOUS

## 2018-01-31 MED ORDER — PROPOFOL 1000 MG/100ML IV EMUL
INTRAVENOUS | Status: AC
  Administered 2018-01-31: 50 mg/h
  Filled 2018-01-31: qty 100

## 2018-01-31 MED ORDER — VANCOMYCIN HCL 10 G IV SOLR
1500.0000 mg | Freq: Once | INTRAVENOUS | Status: AC
  Administered 2018-01-31: 1500 mg via INTRAVENOUS
  Filled 2018-01-31 (×2): qty 1500

## 2018-01-31 MED ORDER — SODIUM CHLORIDE 0.9 % IV BOLUS
500.0000 mL | Freq: Once | INTRAVENOUS | Status: AC
  Administered 2018-01-31: 500 mL via INTRAVENOUS

## 2018-01-31 MED ORDER — PIPERACILLIN-TAZOBACTAM 3.375 G IVPB 30 MIN
3.3750 g | Freq: Once | INTRAVENOUS | Status: AC
  Administered 2018-01-31: 3.375 g via INTRAVENOUS
  Filled 2018-01-31: qty 50

## 2018-01-31 MED ORDER — SODIUM CHLORIDE 0.9 % IV SOLN
Freq: Once | INTRAVENOUS | Status: AC
  Administered 2018-01-31: 19:00:00 via INTRAVENOUS

## 2018-01-31 MED ORDER — VANCOMYCIN HCL IN DEXTROSE 1-5 GM/200ML-% IV SOLN
1000.0000 mg | Freq: Once | INTRAVENOUS | Status: DC
  Filled 2018-01-31: qty 200

## 2018-01-31 NOTE — ED Notes (Signed)
C-collar applied by Gaspar Bidding RN

## 2018-01-31 NOTE — ED Notes (Signed)
Pt covered from head to toe in feces. Pt cleaned by myself, Dr Sabra Heck, Angelina Pih and Gaspar Bidding RN.

## 2018-01-31 NOTE — Progress Notes (Signed)
1825 CO-Ox results handed to Dr. Sabra Heck because results were unable to be entered into  [employer] at this time.

## 2018-01-31 NOTE — Progress Notes (Signed)
Discussed with Dr. Sabra Heck, he called and discussed with intensivist Dr. Tamala Julian who says that there are no beds available at [facility] at this time.  He recommends patient to be kept here with IV hydration for worsening renal function.  We will repeat BMP now to assess potassium and renal function.

## 2018-01-31 NOTE — ED Notes (Signed)
ED TO INPATIENT HANDOFF REPORT  Name/Age/Gender Aaron [name] 62 y.o. male  Code Status Code Status History    Date Active Date Inactive Code Status Order ID Comments User Context   01/27/2018 0254 01/29/2018 1606 Full Code [id number]  Reubin Milan, MD Inpatient   01/09/2018 2025 01/13/2018 1055 Full Code [id number]  Truett Mainland, DO Inpatient   12/05/2017 0059 12/08/2017 2107 Full Code [id number]  Oswald Hillock, MD ED   11/23/2017 1603 11/24/2017 2256 Full Code [id number]  Elam Dutch, MD Inpatient   10/12/2017 1842 10/21/2017 1437 Full Code [id number]  Gabriel Earing, PA-C Inpatient   10/10/2017 1606 10/11/2017 1601 Full Code [id number]  Erline Hau, MD Inpatient   09/19/2017 0157 09/23/2017 1851 Full Code [id number]  Elam Dutch, MD ED   08/24/2017 2215 09/05/2017 1910 Full Code [id number]  Reubin Milan, MD Inpatient   08/24/2017 1939 08/24/2017 2214 Full Code [id number]  Reubin Milan, MD ED   08/14/2017 1737 08/16/2017 1507 Full Code [id number]  Serafina Mitchell, MD Inpatient   08/11/2017 1655 08/11/2017 2141 Full Code [id number]  Serafina Mitchell, MD Inpatient   04/27/2017 2111 04/30/2017 1628 Full Code [id number]  Oswald Hillock, MD Inpatient   12/14/2016 1954 12/19/2016 1754 Full Code [id number]  Reubin Milan, MD ED   09/23/2014 0336 09/24/2014 1740 Full Code [id number]  Ivor Costa, MD Inpatient   09/20/2014 1426 09/20/2014 2131 Full Code [id number]  Serafina Mitchell, MD Inpatient      Home/SNF/Other Home  Chief Complaint unresponsive  Level of Care/Admitting Diagnosis ED Disposition    ED Disposition Condition [place]: [facility] [[id number]]  Level of Care: ICU [6]  Diagnosis: Acute respiratory failure ([place]) [008.67.ICD-9-CM]  Admitting Physician: [name]  Attending Physician: Oswald Hillock [[id number]]  Estimated length of stay: 3 - 4 days  Certification:: I certify this patient will need inpatient services for at  least 2 midnights  PT Class (Do Not Modify): Inpatient [101]  PT Acc Code (Do Not Modify): Private [1]       Medical History Past Medical History:  Diagnosis Date  . Anxiety   . Arthritis    knees , back , shoulders (10/12/2017)  . Atrial fibrillation ([place])   . CAD (coronary artery disease)    Mild nonobstructive disease at cardiac catheterization 2002  . Chronic back pain    "back of my neck; down thru my legs" (10/12/2017)  . COPD (chronic obstructive pulmonary disease) ([place])   . Diverticulitis   . Esophageal reflux   . ESRD (end stage renal disease) on dialysis [facility])    "TTS; [place]" (11/23/2017)  . Essential hypertension   . Hepatitis C    states he no longer has this  . History of kidney stones   . History of syncope   . Hyperlipidemia   . Jerking 09/23/2014  . Myocardial infarction ([place]) 02/2017   "light one" (10/12/2017)  . Non-compliant behavior    non compliant with diaylsis per daughter  . PAT (paroxysmal atrial tachycardia) ([place])   . Peripheral arterial disease ([place])    Occluded left superficial femoral artery status post stent June 2016 - Dr. Trula Slade  . Pneumonia 1961  . Polycystic kidney, unspecified type   . Syncope 09/2014  . SYNCOPE 05/07/2010   Qualifier: Diagnosis of  By: Laurance Flatten, RN, BSN, Jennifer      Allergies No Known Allergies  IV Location/Drains/Wounds  Patient Lines/Drains/Airways Status   Active Line/Drains/Airways    Name:   Placement date:   Placement time:   Site:   Days:   Peripheral IV 01/27/18 Right Forearm   01/27/18    0044    Forearm   4   Peripheral IV 01/31/18 Right External jugular   01/31/18    1706    External jugular   less than 1   Peripheral IV 01/31/18 Right Hand   01/31/18    1811    Hand   less than 1   Peripheral IV 01/31/18 Left Hand   01/31/18    1756    Hand   less than 1   Peripheral IV 01/31/18 Right Antecubital   01/31/18    2208    Antecubital   less than 1   NG/OG Tube Orogastric 16 Fr. Left mouth Xray   01/31/18    1721     Left mouth   less than 1   Urethral Catheter Tana Coast RN Temperature probe 16 Fr.   01/31/18    1809    Temperature probe   less than 1   Airway 7.5 mm   01/31/18    1715     less than 1   Airway 7.5 mm   01/31/18    1716     less than 1          Labs/Imaging Results for orders placed or performed during the hospital encounter of 01/31/18 (from the past 48 hour(s))  CBC with Differential/Platelet     Status: Abnormal   Collection Time: 01/31/18  3:11 PM  Result Value Ref Range   WBC 17.1 (H) 4.0 - 10.5 K/uL   RBC 3.42 (L) 4.22 - 5.81 MIL/uL   Hemoglobin 10.3 (L) 13.0 - 17.0 g/dL   HCT 33.0 (L) 39.0 - 52.0 %   MCV 96.5 80.0 - 100.0 fL   MCH 30.1 26.0 - 34.0 pg   MCHC 31.2 30.0 - 36.0 g/dL   RDW 17.9 (H) 11.5 - 15.5 %   Platelets 340 150 - 400 K/uL   nRBC 0.0 0.0 - 0.2 %   Neutrophils Relative % 91 %   Neutro Abs 15.5 (H) 1.7 - 7.7 K/uL   Lymphocytes Relative 2 %   Lymphs Abs 0.4 (L) 0.7 - 4.0 K/uL   Monocytes Relative 7 %   Monocytes Absolute 1.2 (H) 0.1 - 1.0 K/uL   Eosinophils Relative 0 %   Eosinophils Absolute 0.0 0.0 - 0.5 K/uL   Basophils Relative 0 %   Basophils Absolute 0.0 0.0 - 0.1 K/uL   Immature Granulocytes 0 %   Abs Immature Granulocytes 0.07 0.00 - 0.07 K/uL    Comment: Performed at [facility], [address], [place], [place] [address]  Comprehensive metabolic panel     Status: Abnormal   Collection Time: 01/31/18  3:11 PM  Result Value Ref Range   Sodium 134 (L) 135 - 145 mmol/L   Potassium 4.6 3.5 - 5.1 mmol/L   Chloride 103 98 - 111 mmol/L   CO2 21 (L) 22 - 32 mmol/L   Glucose, Bld 137 (H) 70 - 99 mg/dL   BUN 49 (H) 8 - 23 mg/dL   Creatinine, Ser 4.82 (H) 0.61 - 1.24 mg/dL   Calcium 9.1 8.9 - 10.3 mg/dL   Total Protein 7.6 6.5 - 8.1 g/dL   Albumin 3.1 (L) 3.5 - 5.0 g/dL   AST 21 15 - 41 U/L  ALT 9 0 - 44 U/L   Alkaline Phosphatase 116 38 - 126 U/L   Total Bilirubin 0.8 0.3 - 1.2 mg/dL   GFR calc non Af Amer 12 (L) >60 mL/min   GFR  calc Af Amer 14 (L) >60 mL/min    Comment: (NOTE) The eGFR has been calculated using the CKD EPI equation. This calculation has not been validated in all clinical situations. eGFR's persistently <60 mL/min signify possible Chronic Kidney Disease.    Anion gap 10 5 - 15    Comment: Performed at [facility], [address], [place], [place] [address]  Protime-INR     Status: Abnormal   Collection Time: 01/31/18  3:11 PM  Result Value Ref Range   Prothrombin Time 16.7 (H) 11.4 - 15.2 seconds   INR 1.37     Comment: Performed at [facility], [address], [place], [place] [address]  APTT     Status: None   Collection Time: 01/31/18  3:11 PM  Result Value Ref Range   aPTT 32 24 - 36 seconds    Comment: Performed at [facility], [address] [place], [place] [address]  CK     Status: None   Collection Time: 01/31/18  5:11 PM  Result Value Ref Range   Total CK 139 49 - 397 U/L    Comment: Performed at [facility], [address] [place], [place] [address]  Blood gas, arterial     Status: Abnormal   Collection Time: 01/31/18  5:25 PM  Result Value Ref Range   FIO2 100.00    Delivery systems VENTILATOR    Mode PRESSURE REGULATED VOLUME CONTROL    VT 500 mL   LHR 22 resp/min   Peep/cpap 5.0 cm H20   pH, Arterial 7.312 (L) 7.350 - 7.450   pCO2 arterial 40.3 32.0 - 48.0 mmHg   pO2, Arterial 63.7 (L) 83.0 - 108.0 mmHg   Bicarbonate 19.7 (L) 20.0 - 28.0 mmol/L   Acid-base deficit 5.4 (H) 0.0 - 2.0 mmol/L   O2 Saturation 87.8 %   Collection site REVIEWED BY    Drawn by [id number]    Sample type ARTERIAL    Allens test (pass/fail) NOT INDICATED (A) PASS    Comment: Performed at [facility], [address], [place], [place] [address]  Carboxyhemoglobin (single result)     Status: Abnormal   Collection Time: 01/31/18  5:25 PM  Result Value Ref Range   Carboxyhemoglobin 1.8 (H) 0.5 - 1.5 %    Comment: Performed at [facility], [address] [place], [place] [address]  Urinalysis,  Routine w reflex microscopic     Status: Abnormal   Collection Time: 01/31/18  5:26 PM  Result Value Ref Range   Color, Urine YELLOW YELLOW   APPearance CLEAR CLEAR   Specific Gravity, Urine 1.012 1.005 - 1.030   pH 5.0 5.0 - 8.0   Glucose, UA NEGATIVE NEGATIVE mg/dL   Hgb urine dipstick SMALL (A) NEGATIVE   Bilirubin Urine NEGATIVE NEGATIVE   Ketones, ur NEGATIVE NEGATIVE mg/dL   Protein, ur 30 (A) NEGATIVE mg/dL   Nitrite NEGATIVE NEGATIVE   Leukocytes, UA NEGATIVE NEGATIVE   RBC / HPF 0-5 0 - 5 RBC/hpf   WBC, UA 0-5 0 - 5 WBC/hpf   Bacteria, UA RARE (A) NONE SEEN    Comment: Performed at [facility], [address] [place],  [address]  Rapid urine drug screen (hospital performed)     Status: Abnormal  Collection Time: 01/31/18  5:26 PM  Result Value Ref Range   Opiates POSITIVE (A) NONE DETECTED   Cocaine NONE DETECTED NONE DETECTED   Benzodiazepines NONE DETECTED NONE DETECTED   Amphetamines NONE DETECTED NONE DETECTED   Tetrahydrocannabinol NONE DETECTED NONE DETECTED   Barbiturates NONE DETECTED NONE DETECTED    Comment: (NOTE) DRUG SCREEN FOR MEDICAL PURPOSES ONLY.  IF CONFIRMATION IS NEEDED FOR ANY PURPOSE, NOTIFY LAB WITHIN 5 DAYS. LOWEST DETECTABLE LIMITS FOR URINE DRUG SCREEN Drug Class                     Cutoff (ng/mL) Amphetamine and metabolites    1000 Barbiturate and metabolites    200 Benzodiazepine                 628 Tricyclics and metabolites     300 Opiates and metabolites        300 Cocaine and metabolites        300 THC                            50 Performed at [facility], [address], [place], [place] [address]   I-Stat CG4 Lactic Acid, ED     Status: Abnormal   Collection Time: 01/31/18  5:42 PM  Result Value Ref Range   Lactic Acid, Venous 2.17 (HH) 0.5 - 1.9 mmol/L   Comment NOTIFIED PHYSICIAN   I-stat chem 8, ed     Status: Abnormal   Collection Time: 01/31/18  5:42 PM  Result Value Ref Range   Sodium 136 135 - 145 mmol/L    Potassium 5.8 (H) 3.5 - 5.1 mmol/L   Chloride 105 98 - 111 mmol/L   BUN 69 (H) 8 - 23 mg/dL   Creatinine, Ser 5.30 (H) 0.61 - 1.24 mg/dL   Glucose, Bld 121 (H) 70 - 99 mg/dL   Calcium, Ion 1.16 1.15 - 1.40 mmol/L   TCO2 24 22 - 32 mmol/L   Hemoglobin 11.2 (L) 13.0 - 17.0 g/dL   HCT 33.0 (L) 39.0 - 52.0 %  Culture, blood (Routine X 2) w Reflex to ID Panel     Status: None (Preliminary result)   Collection Time: 01/31/18  7:05 PM  Result Value Ref Range   Specimen Description      BLOOD RIGHT HAND BOTTLES DRAWN AEROBIC ONLY Blood Culture adequate volume   Special Requests      NONE Performed at [facility], [address], [place], [place] [address]    Culture PENDING    Report Status PENDING   Culture, blood (Routine X 2) w Reflex to ID Panel     Status: None (Preliminary result)   Collection Time: 01/31/18  7:10 PM  Result Value Ref Range   Specimen Description      BLOOD LEFT HAND Blood Culture adequate volume BOTTLES DRAWN AEROBIC ONLY   Special Requests      NONE Performed at [facility], [address] [place], [place] [address]    Culture PENDING    Report Status PENDING    Ct Head Wo Contrast  Result Date: 01/31/2018 CLINICAL DATA:  Found unresponsive EXAM: CT HEAD WITHOUT CONTRAST CT CERVICAL SPINE WITHOUT CONTRAST TECHNIQUE: Multidetector CT imaging of the head and cervical spine was performed following the standard protocol without intravenous contrast. Multiplanar CT image reconstructions of the cervical spine were also generated. COMPARISON:  12/25/2017 FINDINGS: CT HEAD FINDINGS Brain:  There is atrophy and chronic small vessel disease changes. No acute intracranial abnormality. Specifically, no hemorrhage, hydrocephalus, mass lesion, acute infarction, or significant intracranial injury. Vascular: No hyperdense vessel or unexpected calcification. Skull: No acute calvarial abnormality. Sinuses/Orbits: Diffuse mucosal thickening throughout the ethmoid air cells and  right sphenoid sinus. Mastoid air cells clear. No orbital soft tissue abnormality. Other: None CT CERVICAL SPINE FINDINGS Alignment: No subluxation Skull base and vertebrae: No acute fracture. No primary bone lesion or focal pathologic process. Soft tissues and spinal canal: No prevertebral fluid or swelling. No visible canal hematoma. Disc levels: Prior anterior fusion from C3-C5. Diffuse degenerative disc and facet disease. Upper chest: Emphysematous changes in the apices. No acute findings. Other: Carotid artery calcifications bilaterally. IMPRESSION: No acute intracranial abnormality. Atrophy, chronic microvascular disease. Prior anterior fusion from C3-C5. Diffuse degenerative disc and facet disease in the cervical spine. No acute bony abnormality. Electronically Signed   By: Rolm Baptise M.D.   On: 01/31/2018 19:42   Ct Cervical Spine Wo Contrast  Result Date: 01/31/2018 CLINICAL DATA:  Found unresponsive EXAM: CT HEAD WITHOUT CONTRAST CT CERVICAL SPINE WITHOUT CONTRAST TECHNIQUE: Multidetector CT imaging of the head and cervical spine was performed following the standard protocol without intravenous contrast. Multiplanar CT image reconstructions of the cervical spine were also generated. COMPARISON:  12/25/2017 FINDINGS: CT HEAD FINDINGS Brain: There is atrophy and chronic small vessel disease changes. No acute intracranial abnormality. Specifically, no hemorrhage, hydrocephalus, mass lesion, acute infarction, or significant intracranial injury. Vascular: No hyperdense vessel or unexpected calcification. Skull: No acute calvarial abnormality. Sinuses/Orbits: Diffuse mucosal thickening throughout the ethmoid air cells and right sphenoid sinus. Mastoid air cells clear. No orbital soft tissue abnormality. Other: None CT CERVICAL SPINE FINDINGS Alignment: No subluxation Skull base and vertebrae: No acute fracture. No primary bone lesion or focal pathologic process. Soft tissues and spinal canal: No  prevertebral fluid or swelling. No visible canal hematoma. Disc levels: Prior anterior fusion from C3-C5. Diffuse degenerative disc and facet disease. Upper chest: Emphysematous changes in the apices. No acute findings. Other: Carotid artery calcifications bilaterally. IMPRESSION: No acute intracranial abnormality. Atrophy, chronic microvascular disease. Prior anterior fusion from C3-C5. Diffuse degenerative disc and facet disease in the cervical spine. No acute bony abnormality. Electronically Signed   By: Rolm Baptise M.D.   On: 01/31/2018 19:42   Dg Chest Port 1 View  Result Date: 01/31/2018 CLINICAL DATA:  Intubated EXAM: PORTABLE CHEST 1 VIEW COMPARISON:  Chest radiograph from earlier today. FINDINGS: Endotracheal tube tip is 14.0 cm above the carina. Enteric tube coils in cervical esophagus and enters the stomach with the tip not seen on this image. Stable cardiomediastinal silhouette with normal heart size. No pneumothorax. No pleural effusion. No pulmonary edema. Mild left basilar scarring versus atelectasis. No acute consolidative airspace disease. IMPRESSION: 1. Endotracheal tube tip 14.0 cm above the carina, recommend advancing. 2. Enteric tube coils in the cervical esophagus and enters the stomach with the tip not seen on this image. 3. Mild left basilar scarring versus atelectasis. These results were called by telephone at the time of interpretation on 01/31/2018 at 6:30 pm to Dr. Noemi Chapel , who verbally acknowledged these results. Electronically Signed   By: Ilona Sorrel M.D.   On: 01/31/2018 18:32   Dg Chest Port 1 View  Result Date: 01/31/2018 CLINICAL DATA:  Unconscious patient. EXAM: PORTABLE CHEST 1 VIEW COMPARISON:  01/26/2018 FINDINGS: Lower portions of the thorax are excluded by collimation. Interval removal of dialysis catheter. Mildly  enlarged cardiac silhouette. Mediastinal contours appear intact. There is no evidence of focal airspace consolidation, pleural effusion or  pneumothorax. Osseous structures are without acute abnormality. Soft tissues are grossly normal. IMPRESSION: Mildly enlarged cardiac silhouette. Otherwise no acute abnormalities, accounting for excluding of the lower thorax. Electronically Signed   By: Fidela Salisbury M.D.   On: 01/31/2018 17:49    Pending Labs Unresulted Labs (From admission, onward)    Start     Ordered   02/01/18 0500  CBC  Daily,   R     01/31/18 2141   02/01/18 0400  Heparin level (unfractionated)  STAT,   R    Comments:  .hep    01/31/18 2141   01/31/18 3664  Basic metabolic panel  STAT,   STAT     01/31/18 2136   01/31/18 2124  Troponin I (q 6hr x 3)  Now then every 6 hours,   R     01/31/18 2124   01/31/18 2054  Lactic acid, plasma  STAT,   R     01/31/18 2054   01/31/18 1726  Urine Culture  Once,   STAT     01/31/18 1726   Signed and Held  CBC  Tomorrow morning,   R     Signed and Held   Signed and Held  Comprehensive metabolic panel  Tomorrow morning,   R     Signed and Held   Signed and Held  Troponin I (q 6hr x 3)  Now then every 6 hours,   R     Signed and Held          Vitals/Pain Today's Vitals   01/31/18 2220 01/31/18 2225 01/31/18 2230 01/31/18 2235  BP: 90/70 (!) 88/74 (!) 86/69 93/70  Pulse: 86 90 82 84  Resp: (!) 22 20 (!) 22 (!) 22  Temp: 99 F (37.2 C) 99.1 F (37.3 C) 99.1 F (37.3 C) 99.3 F (37.4 C)  SpO2: 98% 99% 98% 99%  Weight:      Height:        Isolation Precautions No active isolations  Medications Medications  vancomycin (VANCOCIN) 1,500 mg in sodium chloride 0.9 % 500 mL IVPB (1,500 mg Intravenous New Bag/Given 01/31/18 2246)  heparin bolus via infusion 4,000 Units (4,000 Units Intravenous Bolus from Bag 01/31/18 2239)    Followed by  heparin ADULT infusion 100 units/mL (25000 units/219m sodium chloride 0.45%) (1,000 Units/hr Intravenous New Bag/Given 01/31/18 2208)  sodium chloride 0.9 % bolus 500 mL (500 mLs Intravenous New Bag/Given 01/31/18 2252)   propofol (DIPRIVAN) 1000 MG/100ML infusion (  Stopped 01/31/18 2249)  etomidate (AMIDATE) injection (20 mg Intravenous Given 01/31/18 1714)  rocuronium (ZEMURON) injection (100 mg Intravenous Given 01/31/18 1715)  0.9 %  sodium chloride infusion ( Intravenous Stopped 01/31/18 1922)  0.9 %  sodium chloride infusion ( Intravenous Stopped 01/31/18 2138)  piperacillin-tazobactam (ZOSYN) IVPB 3.375 g (0 g Intravenous Stopped 01/31/18 2150)    Mobility walks

## 2018-01-31 NOTE — ED Provider Notes (Signed)
[facility] EMERGENCY DEPARTMENT Provider Note   CSN: [id number] Arrival date & time: 01/31/18  1701     History   Chief Complaint Chief Complaint  Patient presents with  . unresponsive    HPI YADIEL AUBRY is a 62 y.o. male.  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  He was admitted back to the hospital on October 15 with sepsis type syndrome, improved and by the 18th he was discharged home.  That was 2 days ago.  He presents today by ambulance transport after his family went over and found him unconscious on the floor covered in his own stool and urine.  They found that he was having inadequate respirations but was oxygenating in the 80% range.  He had signs of trauma to the left side of his face and his left shoulder, he was unable to give them any information.  The family states they had not seen him in 24 hours.  They question whether he might have taken too much of his medications.  Level 5 caveat applies secondary to the patient's altered mental status and inability to talk  Past Medical History:  Diagnosis Date  . Anxiety   . Arthritis    knees , back , shoulders (10/12/2017)  . Atrial fibrillation ([place])   . CAD (coronary artery disease)    Mild nonobstructive disease at cardiac catheterization 2002  . Chronic back pain    "back of my neck; down thru my legs" (10/12/2017)  . COPD (chronic obstructive pulmonary disease) ([place])   . Diverticulitis   . Esophageal reflux   . ESRD (end stage renal disease) on dialysis [facility])    "TTS; [place]" (11/23/2017)    . Essential hypertension   . Hepatitis C    states he no longer has this  . History of kidney stones   . History of syncope   . Hyperlipidemia   . Jerking 09/23/2014  . Myocardial infarction ([place]) 02/2017   "light one" (10/12/2017)  . Non-compliant behavior    non compliant with diaylsis per daughter  . PAT (paroxysmal atrial tachycardia) ([place])   . Peripheral arterial disease ([place])    Occluded left superficial femoral artery status post stent June 2016 - Dr. Trula Slade  . Pneumonia 1961  . Polycystic kidney, unspecified type   . Syncope 09/2014  . SYNCOPE 05/07/2010   Qualifier: Diagnosis of  By: Laurance Flatten RN, BSN, [facility]      Patient Active Problem List   Diagnosis Date Noted  . Fever   . Sepsis due to undetermined organism ([place]) 01/27/2018  . Discitis 01/27/2018  . Discitis of lumbar region 01/09/2018  . Paraspinal abscess ([place]) 01/08/2018  . ESRD on dialysis ([place]) 12/30/2017  . Bacteremia 12/07/2017  . Hematoma of arm, right, initial encounter 11/23/2017  . Protein-calorie malnutrition, severe 10/15/2017  . Hyperlipidemia 08/24/2017  . Schatzki's ring of distal esophagus 04/27/2017  . Encounter for therapeutic drug monitoring 03/03/2017  . History of hepatitis C 02/20/2017  . Ischemic cardiomyopathy 01/27/2017  . Dyslipidemia 01/27/2017  . AF (paroxysmal atrial fibrillation) ([place]) 01/27/2017  . CAD (coronary artery disease) 01/21/2017  .  Hepatitis C 12/15/2016  . Polycystic kidney disease 12/14/2016  . COPD (chronic obstructive pulmonary disease) ([place])   . COLD (chronic obstructive lung disease) ([place])   . Depression   . PAD (peripheral artery disease) ([place]) 09/19/2014  . Preop cardiovascular exam 10/28/2010  . Tobacco abuse 10/28/2010  . UNSPECIFIED IRON DEFICIENCY ANEMIA 10/04/2009  . 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  . Essential hypertension 10/04/2009  . Esophageal reflux 10/04/2009  . PRECORDIAL PAIN 10/04/2009    Past Surgical History:  Procedure Laterality Date   . ABDOMINAL AORTOGRAM N/A 08/11/2017   Procedure: ABDOMINAL AORTOGRAM;  Surgeon: Serafina Mitchell, MD;  Location: [place] CV LAB;  Service: Cardiovascular;  Laterality: N/A;  . ANTERIOR CERVICAL DECOMP/DISCECTOMY FUSION    . APPLICATION OF WOUND VAC Right 08/14/2017   Procedure: APPLICATION OF PREVENA INCISIONAL WOUND VAC RIGHT GROIN;  Surgeon: Serafina Mitchell, MD;  [facility];  Service: Vascular;  Laterality: Right;  . AV FISTULA PLACEMENT Left 09/04/2017   Procedure: ARTERIOVENOUS (AV) FISTULA CREATION LEFT UPPER ARM;  Surgeon: Serafina Mitchell, MD;  Location: [place];  Service: Vascular;  Laterality: Left;  . BACK SURGERY    . BASCILIC VEIN TRANSPOSITION Left 11/20/2017   Procedure: SECOND STAGE BASILIC VEIN TRANSPOSITION LEFT ARM;  Surgeon: Serafina Mitchell, MD;  Location: [place];  Service: Vascular;  Laterality: Left;  . BIOPSY  12/17/2016   Procedure: BIOPSY;  Surgeon: Daneil Dolin, MD;  [facility];  Service: Gastroenterology;;  gastric colon  . BIOPSY  04/29/2017   Procedure: BIOPSY;  Surgeon: Daneil Dolin, MD;  [facility];  Service: Endoscopy;;  duodenal biopsies  . BUNIONECTOMY Bilateral   . COLONOSCOPY  2008   Dr. Oneida Alar: rare sigmoid colon diverticulosis, internal hemorrhoids.   . COLONOSCOPY WITH PROPOFOL N/A 12/17/2016   dense left-sided diverticulosis, right colon ulcers s/p biopsy query occult NSAID use vs transient ischemia, not consistent with IBD. CMV stains negative.   Marland Kitchen ENDARTERECTOMY FEMORAL Right 08/14/2017   Procedure: RIGHT ILLIO-FEMORAL ENDARTERECTOMY;  Surgeon: Serafina Mitchell, MD;  Location: [facility];  Service: Vascular;  Laterality: Right;  . ESOPHAGEAL DILATION  12/17/2016   EGD with mild Schatzki's ring s/p dilatation, small hiatal hernia, erosive gastropathy (negative H.pylori gastritis)  . ESOPHAGOGASTRODUODENOSCOPY  2008   Dr. Oneida Alar: normal esophagus without Barrett's, antritis and duodenitis, path with H.pylori gastritis  .  ESOPHAGOGASTRODUODENOSCOPY (EGD) WITH PROPOFOL N/A 12/17/2016   Procedure: ESOPHAGOGASTRODUODENOSCOPY (EGD) WITH PROPOFOL;  Surgeon: Daneil Dolin, MD;  [facility];  Service: Gastroenterology;  Laterality: N/A;  . ESOPHAGOGASTRODUODENOSCOPY (EGD) WITH PROPOFOL N/A 04/29/2017   Patchy erythema of gastric mucosa diffusely, extensive inflammatory changes in duodenum, geographic ulceration and mucosal edema present, encroaching somewhat on the lumen yet still widely patent, distal second portion of duodenum appeared abnormal, path with peptic duodenitis with ulceration  . GROIN DEBRIDEMENT Right 10/14/2017   Procedure: RIGHT GROIN AND RIGHT LOWER QUADRANT ABDOMEN DEBRIDEMENT WITH PLACEMENT OF ANTIBIOTIC BEADS;  Surgeon: Serafina Mitchell, MD;  Location: [place];  Service: Vascular;  Laterality: Right;  . HEMATOMA EVACUATION Left 12/06/2017   Procedure: EVACUATION HEMATOMA;  Surgeon: Waynetta Sandy, MD;  Location: [place];  Service: Vascular;  Laterality: Left;  . HERNIA REPAIR  7322   umbilical  . I&D EXTREMITY Right 09/21/2017   Procedure: IRRIGATION AND DEBRIDEMENT GROIN;  Surgeon: Elam Dutch, MD;  Location: [place];  Service: Vascular;  Laterality: Right;  . I&D EXTREMITY Left 11/23/2017  Procedure: Evacuation Hematoma LEFT UPPER ARM GRAFT;  Surgeon: Elam Dutch, MD;  Location: [place];  Service: Vascular;  Laterality: Left;  . INGUINAL HERNIA REPAIR Right 02/2017   [place]  . INSERTION OF DIALYSIS CATHETER Right 09/04/2017   Procedure: INSERTION OF TUNNELED  DIALYSIS CATHETER - RIGHT INTERNAL JUGULAR PLACEMENT;  Surgeon: Serafina Mitchell, MD;  Location: [place];  Service: Vascular;  Laterality: Right;  . INSERTION OF ILIAC STENT Right 08/14/2017   Procedure: INSERTION OF RIGHT COMMON ILIAC STENT 31m x 85mx 130cm INSERTION OF RIGHT EXTERNAL ILIAC STENT 31m13m 67m43m130cm INSERTION OF SUPERFICIAL FERMORAL ARTERY STENT 7mm 2m0mm 331m0cm;  Surgeon: BrabhaSerafina Mitchell  [facility]  Laterality: Right;  . IR FLUORO GUIDE CV LINE RIGHT  08/31/2017  . IR US [place] VASC ACCESS RIGHT  08/31/2017  . LOWER EXTREMITY ANGIOGRAPHY Right 08/11/2017   Procedure: LOWER EXTREMITY ANGIOGRAPHY;  Surgeon: BrabhaSerafina Mitchell [facility];  Service: Cardiovascular;  Laterality: Right;  . PATCH ANGIOPLASTY Right 08/14/2017   Procedure: PATCH ANGIOPLASTY USING HEMASHIELD PATCH 0.3IN X 6IN;Lillie Columbiageon: BrabhaSerafina Mitchell Location: [facility];  Service: Vascular;  Laterality: Right;  . PERIPHERAL VASCULAR CATHETERIZATION N/A 09/20/2014   Procedure: Abdominal Aortogram;  Surgeon: Vance Serafina Mitchell [facility];  Service: Cardiovascular;  Laterality: N/A;  . POSTERIOR FUSION LUMBAR SPINE    . TEE WITHOUT CARDIOVERSION N/A 01/12/2018   Procedure: TRANSESOPHAGEAL ECHOCARDIOGRAM (TEE);  Surgeon: CrenshLelon Perla [facility];  Service: Cardiovascular;  Laterality: N/A;        Home Medications    Prior to Admission medications   Medication Sig Start Date End Date Taking? Authorizing Provider  amoxicillin-clavulanate (AUGMENTIN) 500-125 MG tablet Take 1 tablet (500 mg total) by mouth every 12 (twelve) hours. 01/29/18 03/12/18  KrishnBonnielee HaffB Complex-C-Folic Acid (DIALYVITE 800) 0115MG TABS Take 1 tablet by mouth daily. 09/15/17   [provider]  BREO ELLIPTA 200-25 MCG/INH AEPB Inhale 1 puff into the lungs every evening.  07/27/17   [provider]  calcium acetate (PHOSLO) 667 MG capsule Take 667 mg by mouth at bedtime. Prescribed three times daily with meals 09/15/17   [provider]  carvedilol (COREG) 25 MG tablet Take 1 tablet (25 mg total) by mouth 2 (two) times daily. 01/29/18 02/28/18  KrishnBonnielee Haffcholecalciferol (VITAMIN D) 1000 units tablet Take 2,000 Units by mouth daily.    [provider]  ciprofloxacin (CIPRO) 500 MG tablet Take 1 tablet (500 mg total)  by mouth daily. 01/30/18 03/13/18  KrishnBonnielee Haffclopidogrel (PLAVIX) 75 MG tablet Take 75 mg by mouth daily.  12/24/17   [provider]  furosemide (LASIX) 80 MG tablet Take 1 tablet (80 mg total) by mouth daily. 01/30/18   KrishnBonnielee HaffNutritional Supplements (FEEDING SUPPLEMENT, NEPRO CARB STEADY,) LIQD Take 237 mLs by mouth 2 (two) times daily between meals. 10/21/17   Rhyne, SamantHulen Shouts  oxyCODONE-acetaminophen (PERCOCET) 10-325 MG tablet Take 1 tablet by mouth every 6 (six) hours as needed for pain. 01/29/18   KrishnBonnielee Haffpantoprazole (PROTONIX) 40 MG tablet Take 1 tablet (40 mg total) by mouth 2 (two) times daily. 04/30/17 01/27/26  Shah, Manuella Ghaziik D, DO  warfarin (COUMADIN) 5 MG tablet Take 1 tablet (5 mg total) by mouth daily at  6 PM. 01/29/18   Bonnielee Haff, MD    Family History Family History  Problem Relation Age of Onset  . Alcoholism Mother   . Heart disease Father        Massive heart attack  . Heart attack Father   . Atrial fibrillation Father   . Colon cancer Father   . Colon cancer Maternal Grandfather 34  . Alcoholism Maternal Grandfather   . Renal cancer Cousin   . Ovarian cancer Sister     Social History Social History   Tobacco Use  . Smoking status: Current Every Day Smoker    Packs/day: 2.00    Years: 47.00    Pack years: 94.00    Types: Cigarettes    Last attempt to quit: 08/03/2017    Years since quitting: 0.4  . Smokeless tobacco: Never Used  Substance Use Topics  . Alcohol use: Not Currently    Comment: 10/12/2017 alcohol free since 2017,  heavy drinker in the past  . Drug use: Not Currently    Comment: "<2003 whatever was around; nothing since"     Allergies   Patient has no known allergies.   Review of Systems Review of Systems  Unable to perform ROS: Mental status change     Physical Exam Updated Vital Signs BP (!) 157/109 (BP Location: Right Arm)   Pulse (!) 109   Resp (!) 22   Ht 1.93 m (6'  4")   Wt 85 kg   SpO2 99%   BMI 22.81 kg/m   Physical Exam  Constitutional:  The patient is in acute distress, ill-appearing, covered in feces, not speaking  HENT:  The patient has evidence of ecchymosis around his left eye  Eyes:  Pupils seem to be equal round and reactive at approximately 2 mm, conjunctive are clear  Neck:  Neck was immobilized on arrival, he has external jugular access on the left which was not working, this was removed on arrival  Cardiovascular:  The patient is in atrial fibrillation with a rapid ventricular rate at approximately 150 bpm.  Pulses are adequate at the radial arteries, mild edema of the legs  Pulmonary/Chest:  Breathing approximately 10 times per minute, shallow respirations, copious amounts of emesis in the posterior pharynx  Abdominal:  Abdomen is distended, soft, no fluid wave  Genitourinary:  Genitourinary Comments: Normal-appearing penis scrotum and testicles  Musculoskeletal: He exhibits edema.  Mild edema of the bilateral lower extremities, symmetrical at the ankles  Neurological:  Obtunded, occasional myoclonic jerking movements, this is not symmetrical, it is not rhythmic, he is not able to follow commands, he will open his eyes, he will not say his name, occasionally mumbles something which is incoherent  Skin:  Multiple small skin tears especially around the left shoulder and the face, covered in feces     ED Treatments / Results  Labs (all labs ordered are listed, but only abnormal results are displayed) Labs Reviewed  URINE CULTURE  CULTURE, BLOOD (ROUTINE X 2)  CULTURE, BLOOD (ROUTINE X 2)  CBC WITH DIFFERENTIAL/PLATELET  COMPREHENSIVE METABOLIC PANEL  BLOOD GAS, ARTERIAL  CARBOXYHEMOGLOBIN - COOX  URINALYSIS, ROUTINE W REFLEX MICROSCOPIC  RAPID URINE DRUG SCREEN, HOSP PERFORMED  PROTIME-INR  APTT  I-STAT CG4 LACTIC ACID, ED  I-STAT CHEM 8, ED    EKG EKG Interpretation  Date/Time:  Sunday January 31 2018 20:48:18  EDT Ventricular Rate:  91 PR Interval:    QRS Duration: 90 QT Interval:  378 QTC Calculation: 466 R  Axis:   -46 Text Interpretation:  Atrial fibrillation Left anterior fascicular block Anterior infarct, old Borderline T abnormalities, inferior leads Since last tracing rate slower Confirmed by Noemi Chapel [phone]) on 01/31/2018 9:23:36 PM   Radiology No results found.  Procedures .Critical Care Performed by: Noemi Chapel, MD Authorized by: Noemi Chapel, MD   Critical care provider statement:    Critical care time (minutes):  35   Critical care time was exclusive of:  Separately billable procedures and treating other patients and teaching time   Critical care was necessary to treat or prevent imminent or life-threatening deterioration of the following conditions:  Respiratory failure   Critical care was time spent personally by me on the following activities:  Blood draw for specimens, development of treatment plan with patient or surrogate, discussions with consultants, evaluation of patient's response to treatment, examination of patient, obtaining history from patient or surrogate, ordering and performing treatments and interventions, ordering and review of laboratory studies, ordering and review of radiographic studies, pulse oximetry, re-evaluation of patient's condition and review of old charts Procedure Name: Intubation Date/Time: 01/31/2018 5:33 PM Performed by: Noemi Chapel, MD Pre-anesthesia Checklist: Patient identified, Patient being monitored, Emergency Drugs available, Timeout performed and Suction available Oxygen Delivery Method: Non-rebreather mask Preoxygenation: Pre-oxygenation with 100% oxygen Induction Type: Rapid sequence Ventilation: Mask ventilation without difficulty Laryngoscope Size: Mac and 4 Grade View: Grade II Tube size: 7.5 mm Number of attempts: 1 Airway Equipment and Method: Rigid stylet Placement Confirmation: ETT inserted through vocal cords  under direct vision,  CO2 detector and Breath sounds checked- equal and bilateral Secured at: 21 cm Tube secured with: ETT holder Dental Injury: Teeth and Oropharynx as per pre-operative assessment     .Critical Care Performed by: Noemi Chapel, MD Authorized by: Noemi Chapel, MD   Critical care provider statement:    Critical care time (minutes):  35   Critical care time was exclusive of:  Separately billable procedures and treating other patients and teaching time   Critical care was necessary to treat or prevent imminent or life-threatening deterioration of the following conditions:  Respiratory failure   Critical care was time spent personally by me on the following activities:  Blood draw for specimens, development of treatment plan with patient or surrogate, discussions with consultants, evaluation of patient's response to treatment, examination of patient, obtaining history from patient or surrogate, ordering and performing treatments and interventions, ordering and review of laboratory studies, ordering and review of radiographic studies, pulse oximetry, re-evaluation of patient's condition and review of old charts   (including critical care time)  Medications Ordered in ED Medications  propofol (DIPRIVAN) 1000 MG/100ML infusion (has no administration in time range)     Initial Impression / Assessment and Plan / ED Course  I have reviewed the triage vital signs and the nursing notes.  Pertinent labs & imaging results that were available during my care of the patient were reviewed by me and considered in my medical decision making (see chart for details).     The patient is ill-appearing, he is in atrial fibrillation with a rapid rate, he is hypoxic, right apneic and having inadequate respirations.  He has copious amounts of fluid and emesis and frothy sputum in the back of his throat making him a very high risk for aspiration pneumonia.  At this time the patient will be  intubated for airway protection and adequate oxygenation.  The cause of the patient's altered mental status is not clear, would consider polysubstance  abuse, drug overdose, carboxyhemoglobin poison toxicity, stroke, hemorrhage, progressive infection and sepsis, COPD exacerbation with hypercapnia and multiple other possible etiologies.  We will proceed with CT scan of the brain, immobilization of the cervical neck, chest x-ray, laboratory work-up with drug screen urinalysis Foley catheter placement blood cultures, lactic acid and IV fluids.  The patient will require sedation, propofol has been ordered.  I personally placed an Angiocath in the right external jugular vein to obtain IV access prior to intubation.  Angiocath insertion Performed by: Johnna Acosta  Consent: Verbal consent obtained. Risks and benefits: risks, benefits and alternatives were discussed Time out: Immediately prior to procedure a "time out" was called to verify the correct patient, procedure, equipment, support staff and site/side marked as required.  Preparation: Patient was prepped and draped in the usual sterile fashion.  Vein Location: R EJ  Not Ultrasound Guided  Gauge: 20  Normal blood return and flush without difficulty Patient tolerance: Patient tolerated the procedure well with no immediate complications.   Ultimately the CT scan does not reveal any acute findings, the chest x-ray does not reveal pneumonia, the labs do not show any significant answer to the patient's altered mental status other than the opiate positive drug screen.  I discussed the patient's care with multiple consultants including the hospitalist, Dr. Darrick Meigs as well as the intensivist Dr. Tamala Julian at [facility].  Unfortunately there is no beds available at the larger center, the patient will need to be admitted here into the ICU.  The repeat metabolic panel does show some possible hyperkalemia, this will be repeated again as that was  done on an i-STAT test.    Final Clinical Impressions(s) / ED Diagnoses   Final diagnoses:  Acute respiratory failure with hypoxia ([place])  ESRD (end stage renal disease) ([place])  Acute encephalopathy      Noemi Chapel, MD 01/31/18 2138

## 2018-01-31 NOTE — H&P (Addendum)
TRH H&P    Patient Demographics:    Aaron [name], is a 62 y.o. male  MRN: [id number]  DOB - [birth year]  Admit Date - 01/31/2018  Referring MD/NP/PA: Dr. Sabra Heck  Outpatient Primary MD for the patient is [facility]  Patient coming from: Home  Chief complaint-altered mental status   HPI:    Aaron [name]  is a 62 y.o. male, with history of anxiety, osteoarthritis, atrial fibrillation on chronic anticoagulation with Coumadin, CAD, chronic back pain, COPD, ESRD who was previously on hemodialysis, nephrology had stopped dialysis as patient had been noncompliant and started making urine as per note from previous hospitalization from 01/29/2018, dialysis catheter was removed.  Patient recently was admitted with sepsis due to paraspinal abscess status post drainage, lumbar discitis.  He was discharged home on ciprofloxacin and Augmentin for 6 weeks of therapy.  At that time he also had a TEE which was negative for vegetation.  The patient was brought to the ED as he was found by family unconscious on the floor covered in his own stool and urine.  He was having inadequate respiration, and was oxygenating in 80% range.  He had signs of trauma to the left side of face and left shoulder and was unable to give any information. In the ED patient was agitated with altered mental status and was intubated.  Currently he is sedated with propofol.  No other history is obtainable.     Review of systems:    Unobtainable, patient is sedated on mechanical ventilation   With Past History of the following :    Past Medical History:  Diagnosis Date  . Anxiety   . Arthritis    knees , back , shoulders (10/12/2017)  . Atrial fibrillation ([place])   . CAD (coronary artery disease)    Mild nonobstructive disease at cardiac catheterization 2002  . Chronic back pain    "back of my neck; down thru my legs"  (10/12/2017)  . COPD (chronic obstructive pulmonary disease) ([place])   . Diverticulitis   . Esophageal reflux   . ESRD (end stage renal disease) on dialysis [facility])    "TTS; [place]" (11/23/2017)  . Essential hypertension   . Hepatitis C    states he no longer has this  . History of kidney stones   . History of syncope   . Hyperlipidemia   . Jerking 09/23/2014  . Myocardial infarction ([place]) 02/2017   "light one" (10/12/2017)  . Non-compliant behavior    non compliant with diaylsis per daughter  . PAT (paroxysmal atrial tachycardia) ([place])   . Peripheral arterial disease ([place])    Occluded left superficial femoral artery status post stent June 2016 - Dr. Trula Slade  . Pneumonia 1961  . Polycystic kidney, unspecified type   . Syncope 09/2014  . SYNCOPE 05/07/2010   Qualifier: Diagnosis of  By: Laurance Flatten RN, BSN, Anderson Malta        Past Surgical History:  Procedure Laterality Date  . ABDOMINAL AORTOGRAM N/A 08/11/2017   Procedure: ABDOMINAL AORTOGRAM;  Surgeon: Harold Barban  W, MD;  Location: [place] [facility];  Service: Cardiovascular;  Laterality: N/A;  . ANTERIOR CERVICAL DECOMP/DISCECTOMY FUSION    . APPLICATION OF WOUND VAC Right 08/14/2017   Procedure: APPLICATION OF PREVENA INCISIONAL WOUND VAC RIGHT GROIN;  Surgeon: Serafina Mitchell, MD;  [facility];  Service: Vascular;  Laterality: Right;  . AV FISTULA PLACEMENT Left 09/04/2017   Procedure: ARTERIOVENOUS (AV) FISTULA CREATION LEFT UPPER ARM;  Surgeon: Serafina Mitchell, MD;  Location: [place];  Service: Vascular;  Laterality: Left;  . BACK SURGERY    . BASCILIC VEIN TRANSPOSITION Left 11/20/2017   Procedure: SECOND STAGE BASILIC VEIN TRANSPOSITION LEFT ARM;  Surgeon: Serafina Mitchell, MD;  Location: [place];  Service: Vascular;  Laterality: Left;  . BIOPSY  12/17/2016   Procedure: BIOPSY;  Surgeon: Daneil Dolin, MD;  [facility];  Service: Gastroenterology;;  gastric colon  . BIOPSY  04/29/2017   Procedure: BIOPSY;  Surgeon: Daneil Dolin, MD;  [facility];  Service: Endoscopy;;  duodenal biopsies  . BUNIONECTOMY Bilateral   . COLONOSCOPY  2008   Dr. Oneida Alar: rare sigmoid colon diverticulosis, internal hemorrhoids.   . COLONOSCOPY WITH PROPOFOL N/A 12/17/2016   dense left-sided diverticulosis, right colon ulcers s/p biopsy query occult NSAID use vs transient ischemia, not consistent with IBD. CMV stains negative.   Marland Kitchen ENDARTERECTOMY FEMORAL Right 08/14/2017   Procedure: RIGHT ILLIO-FEMORAL ENDARTERECTOMY;  Surgeon: Serafina Mitchell, MD;  Location: [facility];  Service: Vascular;  Laterality: Right;  . ESOPHAGEAL DILATION  12/17/2016   EGD with mild Schatzki's ring s/p dilatation, small hiatal hernia, erosive gastropathy (negative H.pylori gastritis)  . ESOPHAGOGASTRODUODENOSCOPY  2008   Dr. Oneida Alar: normal esophagus without Barrett's, antritis and duodenitis, path with H.pylori gastritis  . ESOPHAGOGASTRODUODENOSCOPY (EGD) WITH PROPOFOL N/A 12/17/2016   Procedure: ESOPHAGOGASTRODUODENOSCOPY (EGD) WITH PROPOFOL;  Surgeon: Daneil Dolin, MD;  [facility];  Service: Gastroenterology;  Laterality: N/A;  . ESOPHAGOGASTRODUODENOSCOPY (EGD) WITH PROPOFOL N/A 04/29/2017   Patchy erythema of gastric mucosa diffusely, extensive inflammatory changes in duodenum, geographic ulceration and mucosal edema present, encroaching somewhat on the lumen yet still widely patent, distal second portion of duodenum appeared abnormal, path with peptic duodenitis with ulceration  . GROIN DEBRIDEMENT Right 10/14/2017   Procedure: RIGHT GROIN AND RIGHT LOWER QUADRANT ABDOMEN DEBRIDEMENT WITH PLACEMENT OF ANTIBIOTIC BEADS;  Surgeon: Serafina Mitchell, MD;  Location: [place];  Service: Vascular;  Laterality: Right;  . HEMATOMA EVACUATION Left 12/06/2017   Procedure: EVACUATION HEMATOMA;  Surgeon: Waynetta Sandy, MD;  Location: [place];  Service: Vascular;  Laterality: Left;  . HERNIA REPAIR  7253   umbilical  . I&D EXTREMITY Right  09/21/2017   Procedure: IRRIGATION AND DEBRIDEMENT GROIN;  Surgeon: Elam Dutch, MD;  Location: [facility];  Service: Vascular;  Laterality: Right;  . I&D EXTREMITY Left 11/23/2017   Procedure: Evacuation Hematoma LEFT UPPER ARM GRAFT;  Surgeon: Elam Dutch, MD;  Location: [place];  Service: Vascular;  Laterality: Left;  . INGUINAL HERNIA REPAIR Right 02/2017   [place]  . INSERTION OF DIALYSIS CATHETER Right 09/04/2017   Procedure: INSERTION OF TUNNELED  DIALYSIS CATHETER - RIGHT INTERNAL JUGULAR PLACEMENT;  Surgeon: Serafina Mitchell, MD;  Location: [employer];  Service: Vascular;  Laterality: Right;  . INSERTION OF ILIAC STENT Right 08/14/2017   Procedure: INSERTION OF RIGHT COMMON ILIAC STENT 64m x 840mx 130cm INSERTION OF RIGHT EXTERNAL ILIAC STENT 72m82m  66m x 130cm INSERTION OF SUPERFICIAL FERMORAL ARTERY STENT 779mx 4045m 130cm;  Surgeon: BraSerafina MitchellD;  Location: [facility];  Service: Vascular;  Laterality: Right;  . IR FLUORO GUIDE CV LINE RIGHT  08/31/2017  . IR US [place]IDE VASC ACCESS RIGHT  08/31/2017  . LOWER EXTREMITY ANGIOGRAPHY Right 08/11/2017   Procedure: LOWER EXTREMITY ANGIOGRAPHY;  Surgeon: BraSerafina MitchellD;  [facility];  Service: Cardiovascular;  Laterality: Right;  . PATCH ANGIOPLASTY Right 08/14/2017   Procedure: PATCH ANGIOPLASTY USING HEMASHIELD PATCH 0.3IN X 6Lillie ColumbiaSurgeon: BraSerafina MitchellD;  [facility];  Service: Vascular;  Laterality: Right;  . PERIPHERAL VASCULAR CATHETERIZATION N/A 09/20/2014   Procedure: Abdominal Aortogram;  Surgeon: VanSerafina MitchellD;  Location[facility] [place] [facility];  Service: Cardiovascular;  Laterality: N/A;  . POSTERIOR FUSION LUMBAR SPINE    . TEE WITHOUT CARDIOVERSION N/A 01/12/2018   Procedure: TRANSESOPHAGEAL ECHOCARDIOGRAM (TEE);  Surgeon: CreLelon PerlaD;  [facility];  Service: Cardiovascular;  Laterality: N/A;      Social History:      Social History   Tobacco Use  . Smoking status: Current  Every Day Smoker    Packs/day: 2.00    Years: 47.00    Pack years: 94.00    Types: Cigarettes    Last attempt to quit: 08/03/2017    Years since quitting: 0.4  . Smokeless tobacco: Never Used  Substance Use Topics  . Alcohol use: Not Currently    Comment: 10/12/2017 alcohol free since 2017,  heavy drinker in the past       Family History :     Family History  Problem Relation Age of Onset  . Alcoholism Mother   . Heart disease Father        Massive heart attack  . Heart attack Father   . Atrial fibrillation Father   . Colon cancer Father   . Colon cancer Maternal Grandfather 60 8 Alcoholism Maternal Grandfather   . Renal cancer Cousin   . Ovarian cancer Sister       Home Medications:   Prior to Admission medications   Medication Sig Start Date End Date Taking? Authorizing Provider  amoxicillin-clavulanate (AUGMENTIN) 500-125 MG tablet Take 1 tablet (500 mg total) by mouth every 12 (twelve) hours. 01/29/18 03/12/18  KriBonnielee HaffD  B Complex-C-Folic Acid (DIALYVITE 800201.8 MG TABS Take 1 tablet by mouth daily. 09/15/17   [provider]  BREO ELLIPTA 200-25 MCG/INH AEPB Inhale 1 puff into the lungs every evening.  07/27/17   [provider]  calcium acetate (PHOSLO) 667 MG capsule Take 667 mg by mouth at bedtime. Prescribed three times daily with meals 09/15/17   [provider]  carvedilol (COREG) 25 MG tablet Take 1 tablet (25 mg total) by mouth 2 (two) times daily. 01/29/18 02/28/18  KriBonnielee HaffD  cholecalciferol (VITAMIN D) 1000 units tablet Take 2,000 Units by mouth daily.    [provider]  ciprofloxacin (CIPRO) 500 MG tablet Take 1 tablet (500 mg total) by mouth daily. 01/30/18 03/13/18  KriBonnielee HaffD  clopidogrel (PLAVIX) 75 MG tablet Take 75 mg by mouth daily.  12/24/17   [provider]  furosemide (LASIX) 80 MG tablet Take 1 tablet (80 mg total) by mouth daily. 01/30/18   KriBonnielee HaffD  Nutritional  Supplements (FEEDING SUPPLEMENT, NEPRO CARB STEADY,) LIQD Take 237 mLs by mouth 2 (two) times daily between meals.  10/21/17   Rhyne, Hulen Shouts, PA-C  oxyCODONE-acetaminophen (PERCOCET) 10-325 MG tablet Take 1 tablet by mouth every 6 (six) hours as needed for pain. 01/29/18   Bonnielee Haff, MD  pantoprazole (PROTONIX) 40 MG tablet Take 1 tablet (40 mg total) by mouth 2 (two) times daily. 04/30/17 01/27/26  Manuella Ghazi, Pratik D, DO  warfarin (COUMADIN) 5 MG tablet Take 1 tablet (5 mg total) by mouth daily at 6 PM. 01/29/18   Bonnielee Haff, MD     Allergies:    No Known Allergies   Physical Exam:   Vitals  Blood pressure 116/90, pulse 93, temperature (!) 97 F (36.1 C), resp. rate (!) 22, height '6\' 4"'  (1.93 m), weight 85 kg, SpO2 100 %.  1.  General: Intubated, sedated  2. Psychiatric: Not tested  3. Neurologic: Not tested  4. Eyes : PERRLA  5. ENMT:  ET tube in place  6. Neck:  Cervical collar in place  7. Respiratory : Scattered wheezing bilaterally  8. Cardiovascular : Irregular rhythm, no leg edema  9. Gastrointestinal:  Positive bowel sounds, abdomen soft, non-tender to palpation,no hepatosplenomegaly, no rigidity or guarding       10. Skin:  No cyanosis, normal texture and turgor, no rash, lesions or ulcers     Data Review:    CBC Recent Labs  Lab 01/26/18 2247 01/27/18 0415 01/28/18 0642 01/29/18 0533 01/31/18 1511 01/31/18 1742  WBC 10.6* 8.5 8.8 7.6 17.1*  --   HGB 9.9* 9.0* 9.1* 8.4* 10.3* 11.2*  HCT 32.4* 28.6* 29.5* 28.4* 33.0* 33.0*  PLT 262 245 261 244 340  --   MCV 98.5 96.3 96.1 98.3 96.5  --   MCH 30.1 30.3 29.6 29.1 30.1  --   MCHC 30.6 31.5 30.8 29.6* 31.2  --   RDW 18.8* 18.6* 18.3* 18.3* 17.9*  --   LYMPHSABS 0.7 0.9 1.1  --  0.4*  --   MONOABS 0.8 0.8 0.7  --  1.2*  --   EOSABS 0.1 0.1 0.2  --  0.0  --   BASOSABS 0.1 0.1 0.1  --  0.0  --     ------------------------------------------------------------------------------------------------------------------  Results for orders placed or performed during the hospital encounter of 01/31/18 (from the past 48 hour(s))  CBC with Differential/Platelet     Status: Abnormal   Collection Time: 01/31/18  3:11 PM  Result Value Ref Range   WBC 17.1 (H) 4.0 - 10.5 K/uL   RBC 3.42 (L) 4.22 - 5.81 MIL/uL   Hemoglobin 10.3 (L) 13.0 - 17.0 g/dL   HCT 33.0 (L) 39.0 - 52.0 %   MCV 96.5 80.0 - 100.0 fL   MCH 30.1 26.0 - 34.0 pg   MCHC 31.2 30.0 - 36.0 g/dL   RDW 17.9 (H) 11.5 - 15.5 %   Platelets 340 150 - 400 K/uL   nRBC 0.0 0.0 - 0.2 %   Neutrophils Relative % 91 %   Neutro Abs 15.5 (H) 1.7 - 7.7 K/uL   Lymphocytes Relative 2 %   Lymphs Abs 0.4 (L) 0.7 - 4.0 K/uL   Monocytes Relative 7 %   Monocytes Absolute 1.2 (H) 0.1 - 1.0 K/uL   Eosinophils Relative 0 %   Eosinophils Absolute 0.0 0.0 - 0.5 K/uL   Basophils Relative 0 %   Basophils Absolute 0.0 0.0 - 0.1 K/uL   Immature Granulocytes 0 %   Abs Immature Granulocytes 0.07 0.00 - 0.07 K/uL    Comment: Performed at [facility],  [address] [place], [place] [address]  Comprehensive metabolic panel     Status: Abnormal   Collection Time: 01/31/18  3:11 PM  Result Value Ref Range   Sodium 134 (L) 135 - 145 mmol/L   Potassium 4.6 3.5 - 5.1 mmol/L   Chloride 103 98 - 111 mmol/L   CO2 21 (L) 22 - 32 mmol/L   Glucose, Bld 137 (H) 70 - 99 mg/dL   BUN 49 (H) 8 - 23 mg/dL   Creatinine, Ser 4.82 (H) 0.61 - 1.24 mg/dL   Calcium 9.1 8.9 - 10.3 mg/dL   Total Protein 7.6 6.5 - 8.1 g/dL   Albumin 3.1 (L) 3.5 - 5.0 g/dL   AST 21 15 - 41 U/L   ALT 9 0 - 44 U/L   Alkaline Phosphatase 116 38 - 126 U/L   Total Bilirubin 0.8 0.3 - 1.2 mg/dL   GFR calc non Af Amer 12 (L) >60 mL/min   GFR calc Af Amer 14 (L) >60 mL/min    Comment: (NOTE) The eGFR has been calculated using the CKD EPI equation. This calculation has not been validated in all  clinical situations. eGFR's persistently <60 mL/min signify possible Chronic Kidney Disease.    Anion gap 10 5 - 15    Comment: Performed at [facility], [address] [place], [place] [address]  Protime-INR     Status: Abnormal   Collection Time: 01/31/18  3:11 PM  Result Value Ref Range   Prothrombin Time 16.7 (H) 11.4 - 15.2 seconds   INR 1.37     Comment: Performed at [facility], [address] [place], [place] [address]  APTT     Status: None   Collection Time: 01/31/18  3:11 PM  Result Value Ref Range   aPTT 32 24 - 36 seconds    Comment: Performed at [facility], [address], [place], [place] [address]  CK     Status: None   Collection Time: 01/31/18  5:11 PM  Result Value Ref Range   Total CK 139 49 - 397 U/L    Comment: Performed at [facility], [address], [place], [place] [address]  Blood gas, arterial     Status: Abnormal   Collection Time: 01/31/18  5:25 PM  Result Value Ref Range   FIO2 100.00    Delivery systems VENTILATOR    Mode PRESSURE REGULATED VOLUME CONTROL    VT 500 mL   LHR 22 resp/min   Peep/cpap 5.0 cm H20   pH, Arterial 7.312 (L) 7.350 - 7.450   pCO2 arterial 40.3 32.0 - 48.0 mmHg   pO2, Arterial 63.7 (L) 83.0 - 108.0 mmHg   Bicarbonate 19.7 (L) 20.0 - 28.0 mmol/L   Acid-base deficit 5.4 (H) 0.0 - 2.0 mmol/L   O2 Saturation 87.8 %   Collection site REVIEWED BY    Drawn by [id number]    Sample type ARTERIAL    Allens test (pass/fail) NOT INDICATED (A) PASS    Comment: Performed at [facility], [address] [place], [place] [address]  Carboxyhemoglobin (single result)     Status: Abnormal   Collection Time: 01/31/18  5:25 PM  Result Value Ref Range   Carboxyhemoglobin 1.8 (H) 0.5 - 1.5 %    Comment: Performed at [facility], [address], [place], [place] [address]  Urinalysis, Routine w reflex microscopic     Status: Abnormal   Collection Time: 01/31/18  5:26 PM  Result Value Ref Range  Color, Urine YELLOW YELLOW   APPearance  CLEAR CLEAR   Specific Gravity, Urine 1.012 1.005 - 1.030   pH 5.0 5.0 - 8.0   Glucose, UA NEGATIVE NEGATIVE mg/dL   Hgb urine dipstick SMALL (A) NEGATIVE   Bilirubin Urine NEGATIVE NEGATIVE   Ketones, ur NEGATIVE NEGATIVE mg/dL   Protein, ur 30 (A) NEGATIVE mg/dL   Nitrite NEGATIVE NEGATIVE   Leukocytes, UA NEGATIVE NEGATIVE   RBC / HPF 0-5 0 - 5 RBC/hpf   WBC, UA 0-5 0 - 5 WBC/hpf   Bacteria, UA RARE (A) NONE SEEN    Comment: Performed at [facility], [address] [place], [place] [address]  Rapid urine drug screen (hospital performed)     Status: Abnormal   Collection Time: 01/31/18  5:26 PM  Result Value Ref Range   Opiates POSITIVE (A) NONE DETECTED   Cocaine NONE DETECTED NONE DETECTED   Benzodiazepines NONE DETECTED NONE DETECTED   Amphetamines NONE DETECTED NONE DETECTED   Tetrahydrocannabinol NONE DETECTED NONE DETECTED   Barbiturates NONE DETECTED NONE DETECTED    Comment: (NOTE) DRUG SCREEN FOR MEDICAL PURPOSES ONLY.  IF CONFIRMATION IS NEEDED FOR ANY PURPOSE, NOTIFY LAB WITHIN 5 DAYS. LOWEST DETECTABLE LIMITS FOR URINE DRUG SCREEN Drug Class                     Cutoff (ng/mL) Amphetamine and metabolites    1000 Barbiturate and metabolites    200 Benzodiazepine                 326 Tricyclics and metabolites     300 Opiates and metabolites        300 Cocaine and metabolites        300 THC                            50 Performed at [facility], [address], [place], [place] [address]   I-Stat CG4 Lactic Acid, ED     Status: Abnormal   Collection Time: 01/31/18  5:42 PM  Result Value Ref Range   Lactic Acid, Venous 2.17 (HH) 0.5 - 1.9 mmol/L   Comment NOTIFIED PHYSICIAN   I-stat chem 8, ed     Status: Abnormal   Collection Time: 01/31/18  5:42 PM  Result Value Ref Range   Sodium 136 135 - 145 mmol/L   Potassium 5.8 (H) 3.5 - 5.1 mmol/L   Chloride 105 98 - 111 mmol/L   BUN 69 (H) 8 - 23 mg/dL   Creatinine, Ser 5.30 (H) 0.61 - 1.24 mg/dL   Glucose, Bld  121 (H) 70 - 99 mg/dL   Calcium, Ion 1.16 1.15 - 1.40 mmol/L   TCO2 24 22 - 32 mmol/L   Hemoglobin 11.2 (L) 13.0 - 17.0 g/dL   HCT 33.0 (L) 39.0 - 52.0 %  Culture, blood (Routine X 2) w Reflex to ID Panel     Status: None (Preliminary result)   Collection Time: 01/31/18  7:05 PM  Result Value Ref Range   Specimen Description      BLOOD RIGHT HAND BOTTLES DRAWN AEROBIC ONLY Blood Culture adequate volume   Special Requests      NONE Performed at [facility], [address] [place], [place] [address]    Culture PENDING    Report Status PENDING   Culture, blood (Routine X 2) w Reflex to ID Panel     Status: None (Preliminary  result)   Collection Time: 01/31/18  7:10 PM  Result Value Ref Range   Specimen Description      BLOOD LEFT HAND Blood Culture adequate volume BOTTLES DRAWN AEROBIC ONLY   Special Requests      NONE Performed at [facility], [address], [place], [place] [address]    Culture PENDING    Report Status PENDING     Chemistries  Recent Labs  Lab 01/26/18 2247 01/28/18 0642 01/29/18 0533 01/31/18 1511 01/31/18 1742  NA 133* 134* 133* 134* 136  K 4.6 4.6 4.6 4.6 5.8*  CL 103 103 100 103 105  CO2 20* 22 22 21*  --   GLUCOSE 100* 113* 116* 137* 121*  BUN 42* 39* 43* 49* 69*  CREATININE 4.51* 4.85* 4.96* 4.82* 5.30*  CALCIUM 9.2 8.6* 8.8* 9.1  --   MG  --  1.8  --   --   --   AST 15  --   --  21  --   ALT 9  --   --  9  --   ALKPHOS 130*  --   --  116  --   BILITOT 0.6  --   --  0.8  --    ------------------------------------------------------------------------------------------------------------------  ------------------------------------------------------------------------------------------------------------------ GFR: Estimated Creatinine Clearance: 17.6 mL/min (A) (by C-G formula based on SCr of 5.3 mg/dL (H)). Liver Function Tests: Recent Labs  Lab 01/26/18 2247 01/28/18 0642 01/29/18 0533 01/31/18 1511  AST 15  --   --  21  ALT 9  --    --  9  ALKPHOS 130*  --   --  116  BILITOT 0.6  --   --  0.8  PROT 7.9  --   --  7.6  ALBUMIN 3.2* 2.4* 2.4* 3.1*   No results for input(s): LIPASE, AMYLASE in the last 168 hours. Recent Labs  Lab 01/26/18 2247  AMMONIA 13   Coagulation Profile: Recent Labs  Lab 01/27/18 0415 01/28/18 0642 01/29/18 0533 01/31/18 1511  INR 1.35 1.24 1.18 1.37   Cardiac Enzymes: Recent Labs  Lab 01/26/18 2247 01/27/18 0725 01/27/18 1249 01/27/18 2055 01/31/18 1711  CKTOTAL  --   --   --   --  139  TROPONINI 0.11* 0.12* 0.09* 0.07*  --    BNP (last 3 results) No results for input(s): PROBNP in the last 8760 hours. HbA1C: No results for input(s): HGBA1C in the last 72 hours. CBG: No results for input(s): GLUCAP in the last 168 hours. Lipid Profile: No results for input(s): CHOL, HDL, LDLCALC, TRIG, CHOLHDL, LDLDIRECT in the last 72 hours. Thyroid Function Tests: No results for input(s): TSH, T4TOTAL, FREET4, T3FREE, THYROIDAB in the last 72 hours. Anemia Panel: No results for input(s): VITAMINB12, FOLATE, FERRITIN, TIBC, IRON, RETICCTPCT in the last 72 hours.  --------------------------------------------------------------------------------------------------------------- Urine analysis:    Component Value Date/Time   COLORURINE YELLOW 01/31/2018 1726   APPEARANCEUR CLEAR 01/31/2018 1726   LABSPEC 1.012 01/31/2018 1726   PHURINE 5.0 01/31/2018 1726   GLUCOSEU NEGATIVE 01/31/2018 1726   HGBUR SMALL (A) 01/31/2018 1726   BILIRUBINUR NEGATIVE 01/31/2018 1726   KETONESUR NEGATIVE 01/31/2018 1726   PROTEINUR 30 (A) 01/31/2018 1726   UROBILINOGEN 0.2 03/03/2010 1056   NITRITE NEGATIVE 01/31/2018 1726   LEUKOCYTESUR NEGATIVE 01/31/2018 1726      Imaging Results:    Ct Head Wo Contrast  Result Date: 01/31/2018 CLINICAL DATA:  Found unresponsive EXAM: CT HEAD WITHOUT CONTRAST CT CERVICAL SPINE WITHOUT CONTRAST TECHNIQUE:  Multidetector CT imaging of the head and cervical spine was  performed following the standard protocol without intravenous contrast. Multiplanar CT image reconstructions of the cervical spine were also generated. COMPARISON:  12/25/2017 FINDINGS: CT HEAD FINDINGS Brain: There is atrophy and chronic small vessel disease changes. No acute intracranial abnormality. Specifically, no hemorrhage, hydrocephalus, mass lesion, acute infarction, or significant intracranial injury. Vascular: No hyperdense vessel or unexpected calcification. Skull: No acute calvarial abnormality. Sinuses/Orbits: Diffuse mucosal thickening throughout the ethmoid air cells and right sphenoid sinus. Mastoid air cells clear. No orbital soft tissue abnormality. Other: None CT CERVICAL SPINE FINDINGS Alignment: No subluxation Skull base and vertebrae: No acute fracture. No primary bone lesion or focal pathologic process. Soft tissues and spinal canal: No prevertebral fluid or swelling. No visible canal hematoma. Disc levels: Prior anterior fusion from C3-C5. Diffuse degenerative disc and facet disease. Upper chest: Emphysematous changes in the apices. No acute findings. Other: Carotid artery calcifications bilaterally. IMPRESSION: No acute intracranial abnormality. Atrophy, chronic microvascular disease. Prior anterior fusion from C3-C5. Diffuse degenerative disc and facet disease in the cervical spine. No acute bony abnormality. Electronically Signed   By: Rolm Baptise M.D.   On: 01/31/2018 19:42   Ct Cervical Spine Wo Contrast  Result Date: 01/31/2018 CLINICAL DATA:  Found unresponsive EXAM: CT HEAD WITHOUT CONTRAST CT CERVICAL SPINE WITHOUT CONTRAST TECHNIQUE: Multidetector CT imaging of the head and cervical spine was performed following the standard protocol without intravenous contrast. Multiplanar CT image reconstructions of the cervical spine were also generated. COMPARISON:  12/25/2017 FINDINGS: CT HEAD FINDINGS Brain: There is atrophy and chronic small vessel disease changes. No acute  intracranial abnormality. Specifically, no hemorrhage, hydrocephalus, mass lesion, acute infarction, or significant intracranial injury. Vascular: No hyperdense vessel or unexpected calcification. Skull: No acute calvarial abnormality. Sinuses/Orbits: Diffuse mucosal thickening throughout the ethmoid air cells and right sphenoid sinus. Mastoid air cells clear. No orbital soft tissue abnormality. Other: None CT CERVICAL SPINE FINDINGS Alignment: No subluxation Skull base and vertebrae: No acute fracture. No primary bone lesion or focal pathologic process. Soft tissues and spinal canal: No prevertebral fluid or swelling. No visible canal hematoma. Disc levels: Prior anterior fusion from C3-C5. Diffuse degenerative disc and facet disease. Upper chest: Emphysematous changes in the apices. No acute findings. Other: Carotid artery calcifications bilaterally. IMPRESSION: No acute intracranial abnormality. Atrophy, chronic microvascular disease. Prior anterior fusion from C3-C5. Diffuse degenerative disc and facet disease in the cervical spine. No acute bony abnormality. Electronically Signed   By: Rolm Baptise M.D.   On: 01/31/2018 19:42   Dg Chest Port 1 View  Result Date: 01/31/2018 CLINICAL DATA:  Intubated EXAM: PORTABLE CHEST 1 VIEW COMPARISON:  Chest radiograph from earlier today. FINDINGS: Endotracheal tube tip is 14.0 cm above the carina. Enteric tube coils in cervical esophagus and enters the stomach with the tip not seen on this image. Stable cardiomediastinal silhouette with normal heart size. No pneumothorax. No pleural effusion. No pulmonary edema. Mild left basilar scarring versus atelectasis. No acute consolidative airspace disease. IMPRESSION: 1. Endotracheal tube tip 14.0 cm above the carina, recommend advancing. 2. Enteric tube coils in the cervical esophagus and enters the stomach with the tip not seen on this image. 3. Mild left basilar scarring versus atelectasis. These results were called by  telephone at the time of interpretation on 01/31/2018 at 6:30 pm to Dr. Noemi Chapel , who verbally acknowledged these results. Electronically Signed   By: Ilona Sorrel M.D.   On: 01/31/2018 18:32  Dg Chest Port 1 View  Result Date: 01/31/2018 CLINICAL DATA:  Unconscious patient. EXAM: PORTABLE CHEST 1 VIEW COMPARISON:  01/26/2018 FINDINGS: Lower portions of the thorax are excluded by collimation. Interval removal of dialysis catheter. Mildly enlarged cardiac silhouette. Mediastinal contours appear intact. There is no evidence of focal airspace consolidation, pleural effusion or pneumothorax. Osseous structures are without acute abnormality. Soft tissues are grossly normal. IMPRESSION: Mildly enlarged cardiac silhouette. Otherwise no acute abnormalities, accounting for excluding of the lower thorax. Electronically Signed   By: Fidela Salisbury M.D.   On: 01/31/2018 17:49    My personal review of EKG: Rhythm atrial fibrillation,  T wave inversions in lead II, III, aVF   Assessment & Plan:    Active Problems:   Acute respiratory failure ([place])   1. Acute respiratory failure-unclear etiology, chest x-ray shows no infiltrate, urine toxin positive for opiates.  Could be due to overdose of opioids.  Patient is currently intubated on mechanical ventilation.  2. Acute encephalopathy-unclear etiology, patient is sedated at this time, history is not obtainable.  Urine toxin positive for opioids.  Could be due to overdose of opioids.  CT head is negative for acute pathology.  Will continue to monitor patient.  Also patient was hypothermic with temperature 95.7 F, started on [facility].  3. Sepsis-patient recently was diagnosed with sepsis due to the paraspinal abscess status post drainage, lumbar discitis.  He was taking Augmentin and ciprofloxacin at home.  Lactic acid is elevated, there is concern for possible aspiration.  Broad-spectrum antibiotics including vancomycin and Zosyn have been started.   We will continue with vancomycin and Zosyn.  4. 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  Today patient presents with creatinine which has gotten worse from 4.82 to 5.30, potassium is high up to 5.8.  I am concerned that patient might need CRRT due to worsening renal function.  Patient will be better served at [facility] with nephrology consultation.  5. Atrial fibrillation-heart rate is controlled, EKG shows atrial fibrillation.  Patient is on chronic anticoagulant with Coumadin.  INR is subtherapeutic today.  Will start heparin per pharmacy consultation.  6. Abnormal EKG-patient EKG shows T wave inversions in lead II, III, aVF which is new as compared to previous EKG from 01/27/2018.  Will obtain serial troponin every 6 hours x3.  Patient did have mild elevation of troponin during previous admission which was thought to be due to demand ischemia.   DVT Prophylaxis-  Heparin  AM Labs Ordered, also please review Full Orders  Family Communication: No family at bedside  Code Status: Full code  Admission status: Inpatient: Based on patients clinical presentation and evaluation of above clinical data, I have made determination that patient meets Inpatient criteria at this time. Patient is intubated, on IV antibiotics.  Time spent in minutes : 60 min   Oswald Hillock M.D on 01/31/2018 at 9:06 PM  Between 7am to 7pm - Pager - [phone]. After 7pm go to [email or web address] - password [facility]  [facility] - Office  [phone]

## 2018-01-31 NOTE — ED Notes (Signed)
Bruise noted on left eye. Skin tear left shoulder.

## 2018-01-31 NOTE — ED Notes (Signed)
Rip Harbour RN spoke with Dr Sabra Heck who confirmed that pt was staying at [facility].

## 2018-01-31 NOTE — ED Notes (Signed)
Bair Hugger warming blanket placed on pt at [employer] setting by Ethelda Chick, Therapist, sports

## 2018-01-31 NOTE — ED Notes (Signed)
NS bolus of 1000 ml started per Dr Sabra Heck for low BP.

## 2018-01-31 NOTE — ED Triage Notes (Signed)
Pt brought to ED via [facility] for Unresponsiveness. Per EMS, pt family found him unresponsive in the floor, covered in feces, vomiting and they thought to be seizure like activity and he came to enough to ask where he was then went back out.

## 2018-01-31 NOTE — Progress Notes (Signed)
Pharmacy Note:  Initial antibiotic(s) regimen of Vancomycin and zosyn  ordered by EDP to treat HCAP.  Estimated Creatinine Clearance: 17.6 mL/min (A) (by C-G formula based on SCr of 5.3 mg/dL (H)).   No Known Allergies  Vitals:   01/31/18 2115 01/31/18 2120  BP: 113/84 105/81  Pulse: (!) 55 82  Resp: (!) 22 (!) 25  Temp: (!) 97.5 F (36.4 C) 97.7 F (36.5 C)  SpO2: 99% 100%    Anti-infectives (From admission, onward)   Start     Dose/Rate Route Frequency Ordered Stop   01/31/18 2130  vancomycin (VANCOCIN) 1,500 mg in sodium chloride 0.9 % 500 mL IVPB     1,500 mg 250 mL/hr over 120 Minutes Intravenous  Once 01/31/18 2108     01/31/18 2045  vancomycin (VANCOCIN) IVPB 1000 mg/200 mL premix  Status:  Discontinued     1,000 mg 200 mL/hr over 60 Minutes Intravenous  Once 01/31/18 2043 01/31/18 2108   01/31/18 2045  piperacillin-tazobactam (ZOSYN) IVPB 3.375 g     3.375 g 100 mL/hr over 30 Minutes Intravenous  Once 01/31/18 2043       Plan: Initial dose(s) of zosyn 3.375g IV and Vancomycin 1500mg  IV  X 1 ordered. F/U admission orders for further dosing if therapy continued.  Isac Sarna, BS [place], [place] Clinical Pharmacist Pager [phone] 01/31/2018 9:42 PM

## 2018-01-31 NOTE — ED Notes (Signed)
Please contact pt's daughter: Whitney Lahmann at [phone] as needed.

## 2018-01-31 NOTE — ED Notes (Signed)
CRITICAL RESULT  01/31/2018 1742  CG4+  Result: 2.17  MD Notified: Hazle Nordmann

## 2018-01-31 NOTE — Progress Notes (Signed)
ANTICOAGULATION CONSULT NOTE - Initial Consult  Pharmacy Consult for Heparin Indication: atrial fibrillation  No Known Allergies  Patient Measurements: Height: 6\' 4"  (193 cm) Weight: 187 lb 6.3 oz (85 kg) IBW/kg (Calculated) : 86.8 HEPARIN DW (KG): 85  Vital Signs: Temp: 97 F (36.1 C) (10/20 2045) BP: 116/90 (10/20 2045) Pulse Rate: 93 (10/20 2045)  Labs: Recent Labs    01/29/18 0533 01/31/18 1511 01/31/18 1711 01/31/18 1742  HGB 8.4* 10.3*  --  11.2*  HCT 28.4* 33.0*  --  33.0*  PLT 244 340  --   --   APTT  --  32  --   --   LABPROT 14.9 16.7*  --   --   INR 1.18 1.37  --   --   CREATININE 4.96* 4.82*  --  5.30*  CKTOTAL  --   --  139  --     Estimated Creatinine Clearance: 17.6 mL/min (A) (by C-G formula based on SCr of 5.3 mg/dL (H)).   Medical History: Past Medical History:  Diagnosis Date  . Anxiety   . Arthritis    knees , back , shoulders (10/12/2017)  . Atrial fibrillation ([place])   . CAD (coronary artery disease)    Mild nonobstructive disease at cardiac catheterization 2002  . Chronic back pain    "back of my neck; down thru my legs" (10/12/2017)  . COPD (chronic obstructive pulmonary disease) ([place])   . Diverticulitis   . Esophageal reflux   . ESRD (end stage renal disease) on dialysis [facility])    "TTS; [place]" (11/23/2017)  . Essential hypertension   . Hepatitis C    states he no longer has this  . History of kidney stones   . History of syncope   . Hyperlipidemia   . Jerking 09/23/2014  . Myocardial infarction ([place]) 02/2017   "light one" (10/12/2017)  . Non-compliant behavior    non compliant with diaylsis per daughter  . PAT (paroxysmal atrial tachycardia) ([place])   . Peripheral arterial disease ([place])    Occluded left superficial femoral artery status post stent June 2016 - Dr. Trula Slade  . Pneumonia 1961  . Polycystic kidney, unspecified type   . Syncope 09/2014  . SYNCOPE 05/07/2010   Qualifier: Diagnosis of  By: Laurance Flatten, RN, BSN, Anderson Malta       Medications:  See med rec  Assessment: 62 y.o. male, with history of anxiety, osteoarthritis, atrial fibrillation on chronic anticoagulation with Coumadin, CAD, chronic back pain, COPD, ESRD who was previously on hemodialysis, nephrology had stopped dialysis as patient had been noncompliant and started making urine as per note from previous hospitalization from 01/29/2018, dialysis catheter was removed.  Patient recently was admitted with sepsis due to paraspinal abscess status post drainage, lumbar discitis.  He was discharged home on ciprofloxacin and Augmentin for 6 weeks of therapy.  At that time he also had a TEE which was negative for vegetation. Patient has been on Coumadin but still subtherapeutic, no dose today and unsure if he took doses the last few days. He is intubated now and Physician asked pharmacy to start heparin.     Goal of Therapy:  Heparin level 0.3-0.7 units/ml Monitor platelets by anticoagulation protocol: Yes   Plan:  Give 4000 units bolus x 1 Start heparin infusion at 1000 units/hr Check anti-Xa level in 6-8 hours and daily while on heparin Continue to monitor H&H and platelets  Trenton Gammon, Tracey Hermance L 01/31/2018,9:10 PM

## 2018-02-01 ENCOUNTER — Encounter (HOSPITAL_COMMUNITY): Payer: Self-pay | Admitting: *Deleted

## 2018-02-01 DIAGNOSIS — N179 Acute kidney failure, unspecified: Secondary | ICD-10-CM

## 2018-02-01 DIAGNOSIS — M4646 Discitis, unspecified, lumbar region: Secondary | ICD-10-CM

## 2018-02-01 LAB — GLUCOSE, CAPILLARY
GLUCOSE-CAPILLARY: 77 mg/dL (ref 70–99)
Glucose-Capillary: 72 mg/dL (ref 70–99)
Glucose-Capillary: 78 mg/dL (ref 70–99)
Glucose-Capillary: 84 mg/dL (ref 70–99)
Glucose-Capillary: 72 mg/dL (ref 70–99)

## 2018-02-01 LAB — COMPREHENSIVE METABOLIC PANEL
ALBUMIN: 2.5 g/dL — AB (ref 3.5–5.0)
ALK PHOS: 84 U/L (ref 38–126)
ALT: 9 U/L (ref 0–44)
ANION GAP: 11 (ref 5–15)
AST: 20 U/L (ref 15–41)
BILIRUBIN TOTAL: 0.8 mg/dL (ref 0.3–1.2)
BUN: 48 mg/dL — ABNORMAL HIGH (ref 8–23)
CALCIUM: 8.5 mg/dL — AB (ref 8.9–10.3)
CO2: 18 mmol/L — ABNORMAL LOW (ref 22–32)
Chloride: 109 mmol/L (ref 98–111)
Creatinine, Ser: 4.64 mg/dL — ABNORMAL HIGH (ref 0.61–1.24)
GFR calc non Af Amer: 12 mL/min — ABNORMAL LOW (ref >60)
GFR, EST AFRICAN AMERICAN: 14 mL/min — AB (ref >60)
Glucose, Bld: 95 mg/dL (ref 70–99)
Potassium: 5 mmol/L (ref 3.5–5.1)
SODIUM: 138 mmol/L (ref 135–145)
TOTAL PROTEIN: 6.2 g/dL — AB (ref 6.5–8.1)

## 2018-02-01 LAB — BLOOD GAS, ARTERIAL
ACID-BASE DEFICIT: 6 mmol/L — AB (ref 0.0–2.0)
BICARBONATE: 20.2 mmol/L (ref 20.0–28.0)
DRAWN BY: 105551
FIO2: 35
LHR: 21 {breaths}/min
MECHVT: 690 mL
Mechanical Rate: 21
O2 SAT: 98.2 %
PCO2 ART: 23.7 mmHg — AB (ref 32.0–48.0)
PEEP/CPAP: 5 cmH2O
PO2 ART: 105 mmHg (ref 83.0–108.0)
pH, Arterial: 7.469 — ABNORMAL HIGH (ref 7.350–7.450)

## 2018-02-01 LAB — TROPONIN I
TROPONIN I: 0.04 ng/mL — AB (ref <0.03)
TROPONIN I: 0.03 ng/mL — AB (ref <0.03)
Troponin I: 0.03 ng/mL (ref <0.03)
Troponin I: 0.03 ng/mL (ref <0.03)

## 2018-02-01 LAB — HEPARIN LEVEL (UNFRACTIONATED)
Heparin Unfractionated: <0.1 IU/mL — ABNORMAL LOW (ref 0.30–0.70)
Heparin Unfractionated: <0.1 IU/mL — ABNORMAL LOW (ref 0.30–0.70)

## 2018-02-01 LAB — TRIGLYCERIDES: Triglycerides: 63 mg/dL (ref <150)

## 2018-02-01 LAB — BASIC METABOLIC PANEL
Anion gap: 10 (ref 5–15)
BUN: 46 mg/dL — ABNORMAL HIGH (ref 8–23)
CALCIUM: 8.4 mg/dL — AB (ref 8.9–10.3)
CHLORIDE: 108 mmol/L (ref 98–111)
CO2: 20 mmol/L — AB (ref 22–32)
CREATININE: 4.61 mg/dL — AB (ref 0.61–1.24)
GFR calc non Af Amer: 12 mL/min — ABNORMAL LOW (ref >60)
GFR, EST AFRICAN AMERICAN: 14 mL/min — AB (ref >60)
Glucose, Bld: 114 mg/dL — ABNORMAL HIGH (ref 70–99)
Potassium: 4.4 mmol/L (ref 3.5–5.1)
Sodium: 138 mmol/L (ref 135–145)

## 2018-02-01 LAB — LACTIC ACID, PLASMA: Lactic Acid, Venous: 1.1 mmol/L (ref 0.5–1.9)

## 2018-02-01 MED ORDER — HEPARIN BOLUS VIA INFUSION
3000.0000 [IU] | Freq: Once | INTRAVENOUS | Status: AC
  Administered 2018-02-01: 3000 [IU] via INTRAVENOUS
  Filled 2018-02-01: qty 3000

## 2018-02-01 MED ORDER — CHLORHEXIDINE GLUCONATE 0.12% ORAL RINSE (MEDLINE KIT)
15.0000 mL | Freq: Two times a day (BID) | OROMUCOSAL | Status: DC
  Administered 2018-02-01 – 2018-02-02 (×3): 15 mL via OROMUCOSAL

## 2018-02-01 MED ORDER — FENTANYL CITRATE (PF) 100 MCG/2ML IJ SOLN
100.0000 ug | INTRAMUSCULAR | Status: DC | PRN
  Administered 2018-02-01 (×2): 100 ug via INTRAVENOUS
  Filled 2018-02-01 (×2): qty 2

## 2018-02-01 MED ORDER — HEPARIN BOLUS VIA INFUSION
2000.0000 [IU] | Freq: Once | INTRAVENOUS | Status: AC
  Administered 2018-02-02: 2000 [IU] via INTRAVENOUS
  Filled 2018-02-01: qty 2000

## 2018-02-01 MED ORDER — ACETAMINOPHEN 325 MG PO TABS: 650.0000 mg | ORAL_TABLET | Freq: Four times a day (QID) | ORAL | Status: DC | PRN

## 2018-02-01 MED ORDER — ACETAMINOPHEN 650 MG RE SUPP: 650.0000 mg | Freq: Four times a day (QID) | RECTAL | Status: DC | PRN

## 2018-02-01 MED ORDER — ORAL CARE MOUTH RINSE
15.0000 mL | OROMUCOSAL | Status: DC
  Administered 2018-02-01 – 2018-02-02 (×13): 15 mL via OROMUCOSAL

## 2018-02-01 MED ORDER — PIPERACILLIN-TAZOBACTAM 3.375 G IVPB
3.3750 g | Freq: Once | INTRAVENOUS | Status: AC
  Administered 2018-02-01: 3.375 g via INTRAVENOUS
  Filled 2018-02-01: qty 50

## 2018-02-01 MED ORDER — ONDANSETRON HCL 4 MG PO TABS: 4.0000 mg | ORAL_TABLET | Freq: Four times a day (QID) | ORAL | Status: DC | PRN

## 2018-02-01 MED ORDER — FUROSEMIDE 10 MG/ML IJ SOLN
80.0000 mg | Freq: Two times a day (BID) | INTRAMUSCULAR | Status: DC
  Administered 2018-02-01 – 2018-02-03 (×6): 80 mg via INTRAVENOUS
  Filled 2018-02-01 (×6): qty 8

## 2018-02-01 MED ORDER — SODIUM CHLORIDE 0.9 % IV SOLN
INTRAVENOUS | Status: DC
  Administered 2018-02-01: 125 mL/h via INTRAVENOUS
  Administered 2018-02-01: 10 mL/h via INTRAVENOUS
  Administered 2018-02-01 (×2): via INTRAVENOUS

## 2018-02-01 MED ORDER — VANCOMYCIN HCL IN DEXTROSE 1-5 GM/200ML-% IV SOLN
1000.0000 mg | INTRAVENOUS | Status: DC
  Administered 2018-02-02 – 2018-02-04 (×2): 1000 mg via INTRAVENOUS
  Filled 2018-02-01 (×4): qty 200

## 2018-02-01 MED ORDER — PROPOFOL 1000 MG/100ML IV EMUL
0.0000 ug/kg/min | INTRAVENOUS | Status: DC
  Administered 2018-02-01: 25 ug/kg/min via INTRAVENOUS
  Administered 2018-02-01: 22.5 ug/kg/min via INTRAVENOUS
  Administered 2018-02-01: 25 ug/kg/min via INTRAVENOUS
  Administered 2018-02-01: 20 ug/kg/min via INTRAVENOUS
  Administered 2018-02-02 (×3): 40 ug/kg/min via INTRAVENOUS
  Filled 2018-02-01 (×6): qty 100

## 2018-02-01 MED ORDER — ONDANSETRON HCL 4 MG/2ML IJ SOLN
4.0000 mg | Freq: Four times a day (QID) | INTRAMUSCULAR | Status: DC | PRN
  Administered 2018-02-07 (×2): 4 mg via INTRAVENOUS
  Filled 2018-02-01 (×2): qty 2

## 2018-02-01 MED ORDER — PIPERACILLIN-TAZOBACTAM 3.375 G IVPB
3.3750 g | Freq: Two times a day (BID) | INTRAVENOUS | Status: DC
  Administered 2018-02-01 – 2018-02-07 (×12): 3.375 g via INTRAVENOUS
  Filled 2018-02-01 (×11): qty 50

## 2018-02-01 MED ORDER — FENTANYL CITRATE (PF) 100 MCG/2ML IJ SOLN
100.0000 ug | INTRAMUSCULAR | Status: DC | PRN
  Administered 2018-02-02: 100 ug via INTRAVENOUS
  Filled 2018-02-01: qty 2

## 2018-02-01 NOTE — Progress Notes (Signed)
ANTICOAGULATION CONSULT NOTE - Follow up [place] for Heparin Indication: atrial fibrillation  No Known Allergies  Patient Measurements: Height: 6\' 4"  (193 cm) Weight: 187 lb 6.3 oz (85 kg) IBW/kg (Calculated) : 86.8 HEPARIN DW (KG): 85  Vital Signs: Temp: 95.5 F (35.3 C) (10/21 2000) Temp Source: Rectal (10/21 2000) BP: 106/86 (10/21 2215) Pulse Rate: 67 (10/21 2215)  Labs: Recent Labs    01/31/18 1511 01/31/18 1711 01/31/18 1742  01/31/18 2258 02/01/18 0513 02/01/18 1120 02/01/18 1517 02/01/18 2159  HGB 10.3*  --  11.2*  --   --   --   --   --   --   HCT 33.0*  --  33.0*  --   --   --   --   --   --   PLT 340  --   --   --   --   --   --   --   --   APTT 32  --   --   --   --   --   --   --   --   LABPROT 16.7*  --   --   --   --   --   --   --   --   INR 1.37  --   --   --   --   --   --   --   --   HEPARINUNFRC  --   --   --   --   --  <0.10*  --  <0.10* <0.10*  CREATININE 4.82*  --  5.30*  --  4.61* 4.64*  --   --   --   CKTOTAL  --  139  --   --   --   --   --   --   --   TROPONINI  --   --   --    < > 0.03* 0.04* 0.03* 0.03*  --    < > = values in this interval not displayed.    Estimated Creatinine Clearance: 20.1 mL/min (A) (by C-G formula based on SCr of 4.64 mg/dL (H)).   Medical History: Past Medical History:  Diagnosis Date  . Anxiety   . Arthritis    knees , back , shoulders (10/12/2017)  . Atrial fibrillation ([place])   . CAD (coronary artery disease)    Mild nonobstructive disease at cardiac catheterization 2002  . Chronic back pain    "back of my neck; down thru my legs" (10/12/2017)  . COPD (chronic obstructive pulmonary disease) ([place])   . Diverticulitis   . Esophageal reflux   . ESRD (end stage renal disease) on dialysis [facility])    "TTS; [place]" (11/23/2017)  . Essential hypertension   . Hepatitis C    states he no longer has this  . History of kidney stones   . History of syncope   . Hyperlipidemia   . Jerking 09/23/2014   . Myocardial infarction ([place]) 02/2017   "light one" (10/12/2017)  . Non-compliant behavior    non compliant with diaylsis per daughter  . PAT (paroxysmal atrial tachycardia) ([place])   . Peripheral arterial disease ([place])    Occluded left superficial femoral artery status post stent June 2016 - Dr. Trula Slade  . Pneumonia 1961  . Polycystic kidney, unspecified type   . Syncope 09/2014  . SYNCOPE 05/07/2010   Qualifier: Diagnosis of  By: Laurance Flatten RN, BSN, Anderson Malta      Medications:  See med rec  Assessment: 62 y.o. male, with history of anxiety, osteoarthritis, atrial fibrillation on chronic anticoagulation with Coumadin, CAD, chronic back pain, COPD, ESRD who was previously on hemodialysis, nephrology had stopped dialysis as patient had been noncompliant and started making urine as per note from previous hospitalization from 01/29/2018, dialysis catheter was removed.  Patient recently was admitted with sepsis due to paraspinal abscess status post drainage, lumbar discitis.  He was discharged home on ciprofloxacin and Augmentin for 6 weeks of therapy.  At that time he also had a TEE which was negative for vegetation. Patient has been on Coumadin but still subtherapeutic, no dose today and unsure if he took doses the last few days. He is intubated now and Physician asked pharmacy to start heparin. Heparin level remains subtherapeutic.     Goal of Therapy:  Heparin level 0.3-0.7 units/ml Monitor platelets by anticoagulation protocol: Yes   Plan:  Heparin bolus 2000 units and increase heparin infusion at 1950 units/hr Check anti-Xa level daily while on heparin Continue to monitor H&H and platelets  Thanks for allowing pharmacy to be a part of this patient's care.  Excell Seltzer, PharmD Clinical Pharmacist

## 2018-02-01 NOTE — Progress Notes (Signed)
PROGRESS NOTE    Aaron [name]  TLX:[id number] DOB: [birth year] DOA: 01/31/2018 PCP: Gwenlyn Saran [place]     Brief Narrative:  62 y.o. male, with history of anxiety, osteoarthritis, atrial fibrillation on chronic anticoagulation with Coumadin, CAD, chronic back pain, COPD, ESRD who was previously on hemodialysis, nephrology had stopped dialysis as patient had been noncompliant and started making urine as per note from previous hospitalization from 01/29/2018, dialysis catheter was removed.  Patient recently was admitted with sepsis due to paraspinal abscess status post drainage, lumbar discitis.  He was discharged home on ciprofloxacin and Augmentin for 6 weeks of therapy.  At that time he also had a TEE which was negative for vegetation.  The patient was brought to the ED as he was found by family unconscious on the floor covered in his own stool and urine.  He was having inadequate respiration, and was oxygenating in 80% range. Intubated for airway protection.   Assessment & Plan:  1-acute resp failure with hypoxia: -with concerns for aspiration PNA -patient intubated for airway protection -continue ventilatory support -continue current antibiotics -wean as tolerated -continue nebs -follow response  2-acute encephalopathy: with concerns for non intended opiate overdose. -now sedated -will follow improvement and wean off sedation -had episode of agitation overnight   3-recent discitis and lumbar abscess -was on oral antibiotics since most recent discharge with intention to complete 6 weeks of tx. 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 -for now continue IVF's and lasis as dictated by nephrology -follow Cr trend  5-Atrial fibrillation -rate is controlled -on heparin for while  intubated -INR subtherapeutic; patient was supposed to be on warfarin   6-elevated troponin -has had chronic elevation, most likely from renal dysfunction -not a good candidate for cath anyway -will monitor on telemetry     DVT prophylaxis: on heparin drip. Code Status: Full Family Communication: no family at bedside. Disposition Plan: remains inpatient, continue ventilatory support, IV antibiotics, lasix and IVF as per renal recommendations. Start weaning sedation and decrease FIO2 as tolerated.  Consultants:   Nephrology   Pulmonary service.  Procedures:   See below for x-ray reports.   Antimicrobials:  Anti-infectives (From admission, onward)   Start     Dose/Rate Route Frequency Ordered Stop   02/02/18 2200  vancomycin (VANCOCIN) IVPB 1000 mg/200 mL premix     1,000 mg 200 mL/hr over 60 Minutes Intravenous Every 48 hours 02/01/18 1133     02/01/18 2000  piperacillin-tazobactam (ZOSYN) IVPB 3.375 g     3.375 g 12.5 mL/hr over 240 Minutes Intravenous Every 12 hours 02/01/18 1133     02/01/18 0445  piperacillin-tazobactam (ZOSYN) IVPB 3.375 g     3.375 g 12.5 mL/hr over 240 Minutes Intravenous  Once 02/01/18 0432 02/01/18 1013   01/31/18 2130  vancomycin (VANCOCIN) 1,500 mg in sodium chloride 0.9 % 500 mL IVPB     1,500 mg 250 mL/hr over 120 Minutes Intravenous  Once 01/31/18 2108 02/01/18 0046   01/31/18 2045  vancomycin (VANCOCIN) IVPB 1000 mg/200 mL premix  Status:  Discontinued     1,000 mg 200 mL/hr over 60 Minutes Intravenous  Once 01/31/18 2043 01/31/18 2108   01/31/18 2045  piperacillin-tazobactam (ZOSYN) IVPB 3.375 g     3.375 g 100 mL/hr over 30 Minutes Intravenous  Once 01/31/18 2043 01/31/18  2150      Subjective: Sedated, intubated and mechanically ventilated. Patient is making urine. No fever.  Objective: Vitals:   02/01/18 1830 02/01/18 1845 02/01/18 1947 02/01/18 2000  BP: (!) 147/95 133/87    Pulse: 95 74    Resp: (!) 21 (!) 24    Temp:     (!) 95.5 F (35.3 C)  TempSrc:    Rectal  SpO2:  100% 100%   Weight:      Height:        Intake/Output Summary (Last 24 hours) at 02/01/2018 2207 Last data filed at 02/01/2018 1824 Gross per 24 hour  Intake 2723.22 ml  Output -  Net 2723.22 ml   Filed Weights   01/31/18 1724  Weight: 85 kg    Examination: General exam: sedated, intubated and mechanically ventilated. Due to sedation no following simple commands. Afebrile  Respiratory system: decrease BS at the bases, no wheezing. Fair movement bilaterally.  Cardiovascular system:RRR. No murmurs, rubs, gallops. Gastrointestinal system: Abdomen is nondistended, soft and nontender. No organomegaly or masses felt. Normal bowel sounds heard. Central nervous system: Alert and oriented. No focal neurological deficits. Extremities: No C/C/E, +pedal pulses Skin: No petechiae, bruises and laceration on the left side of his face. Also with skin breakdown on left shoulder. Psychiatry: Judgement and insight unable to assess due to sedation.   Data Reviewed: I have personally reviewed following labs and imaging studies  CBC: Recent Labs  Lab 01/26/18 2247 01/27/18 0415 01/28/18 0642 01/29/18 0533 01/31/18 1511 01/31/18 1742  WBC 10.6* 8.5 8.8 7.6 17.1*  --   NEUTROABS 9.0* 6.6 6.7  --  15.5*  --   HGB 9.9* 9.0* 9.1* 8.4* 10.3* 11.2*  HCT 32.4* 28.6* 29.5* 28.4* 33.0* 33.0*  MCV 98.5 96.3 96.1 98.3 96.5  --   PLT 262 245 261 244 340  --    Basic Metabolic Panel: Recent Labs  Lab 01/28/18 0642 01/29/18 0533 01/31/18 1511 01/31/18 1742 01/31/18 2258 02/01/18 0513  NA 134* 133* 134* 136 138 138  K 4.6 4.6 4.6 5.8* 4.4 5.0  CL 103 100 103 105 108 109  CO2 22 22 21*  --  20* 18*  GLUCOSE 113* 116* 137* 121* 114* 95  BUN 39* 43* 49* 69* 46* 48*  CREATININE 4.85* 4.96* 4.82* 5.30* 4.61* 4.64*  CALCIUM 8.6* 8.8* 9.1  --  8.4* 8.5*  MG 1.8  --   --   --   --   --   PHOS 5.6*  5.5* 5.0*  --   --   --   --     GFR: Estimated Creatinine Clearance: 20.1 mL/min (A) (by C-G formula based on SCr of 4.64 mg/dL (H)).   Liver Function Tests: Recent Labs  Lab 01/26/18 2247 01/28/18 0642 01/29/18 0533 01/31/18 1511 02/01/18 0513  AST 15  --   --  21 20  ALT 9  --   --  9 9  ALKPHOS 130*  --   --  116 84  BILITOT 0.6  --   --  0.8 0.8  PROT 7.9  --   --  7.6 6.2*  ALBUMIN 3.2* 2.4* 2.4* 3.1* 2.5*   No results for input(s): LIPASE, AMYLASE in the last 168 hours. Recent Labs  Lab 01/26/18 2247  AMMONIA 13   Coagulation Profile: Recent Labs  Lab 01/27/18 0415 01/28/18 0642 01/29/18 0533 01/31/18 1511  INR 1.35 1.24 1.18 1.37   Cardiac Enzymes: Recent Labs  Lab 01/27/18  2055 01/31/18 1711 01/31/18 2258 02/01/18 0513 02/01/18 1120 02/01/18 1517  CKTOTAL  --  139  --   --   --   --   TROPONINI 0.07*  --  0.03* 0.04* 0.03* 0.03*   CBG: Recent Labs  Lab 02/01/18 0737 02/01/18 1118 02/01/18 1620 02/01/18 2004  GLUCAP 72 78 84 72   Lipid Profile: Recent Labs    01/31/18 2258  TRIG 63   Urine analysis:    Component Value Date/Time   COLORURINE YELLOW 01/31/2018 1726   APPEARANCEUR CLEAR 01/31/2018 1726   LABSPEC 1.012 01/31/2018 1726   PHURINE 5.0 01/31/2018 1726   GLUCOSEU NEGATIVE 01/31/2018 1726   HGBUR SMALL (A) 01/31/2018 1726   BILIRUBINUR NEGATIVE 01/31/2018 1726   KETONESUR NEGATIVE 01/31/2018 1726   PROTEINUR 30 (A) 01/31/2018 1726   UROBILINOGEN 0.2 03/03/2010 1056   NITRITE NEGATIVE 01/31/2018 1726   LEUKOCYTESUR NEGATIVE 01/31/2018 1726    Recent Results (from the past 240 hour(s))  Culture, blood (routine x 2)     Status: None   Collection Time: 01/26/18 10:47 PM  Result Value Ref Range Status   Specimen Description BLOOD RIGHT ARM  Final   Special Requests   Final    BOTTLES DRAWN AEROBIC AND ANAEROBIC ANA BCAV AEB [place]   Culture   Final    NO GROWTH 5 DAYS Performed at [facility], [address] [place], [place] [address]    Report  Status 01/31/2018 FINAL  Final  Culture, blood (routine x 2)     Status: None   Collection Time: 01/26/18 10:47 PM  Result Value Ref Range Status   Specimen Description BLOOD RIGHT ARM  Final   Special Requests   Final    BOTTLES DRAWN AEROBIC AND ANAEROBIC ANA BCAV AEB [place]   Culture   Final    NO GROWTH 5 DAYS Performed at [facility], [address], [place], [place] [address]    Report Status 01/31/2018 FINAL  Final  Urine culture     Status: None   Collection Time: 01/27/18  6:35 PM  Result Value Ref Range Status   Specimen Description   Final    URINE, CLEAN CATCH Performed at [facility], [address], [place], [place] [address]    Special Requests   Final    Normal Performed at [facility], [address] [place], [place] [address]    Culture   Final    NO GROWTH Performed at [facility], [place] [address], [place], [place] [address]    Report Status 01/28/2018 FINAL  Final  Culture, blood (Routine X 2) w Reflex to ID Panel     Status: None (Preliminary result)   Collection Time: 01/31/18  7:05 PM  Result Value Ref Range Status   Specimen Description   Final    BLOOD RIGHT HAND BOTTLES DRAWN AEROBIC ONLY Blood Culture adequate volume   Special Requests NONE  Final   Culture   Final    NO GROWTH < 12 HOURS Performed at [facility], [address] [place], [place] [address]    Report Status PENDING  Incomplete  Culture, blood (Routine X 2) w Reflex to ID Panel     Status: None (Preliminary result)   Collection Time: 01/31/18  7:10 PM  Result Value Ref Range Status   Specimen Description   Final    BLOOD LEFT HAND Blood Culture adequate volume BOTTLES DRAWN AEROBIC ONLY   Special Requests NONE  Final  Culture   Final    NO GROWTH < 12 HOURS Performed at [facility], [address], [place], [place] [address]    Report Status PENDING  Incomplete     Radiology Studies: Ct Head Wo Contrast  Result Date: 01/31/2018 CLINICAL DATA:  Found unresponsive  EXAM: CT HEAD WITHOUT CONTRAST CT CERVICAL SPINE WITHOUT CONTRAST TECHNIQUE: Multidetector CT imaging of the head and cervical spine was performed following the standard protocol without intravenous contrast. Multiplanar CT image reconstructions of the cervical spine were also generated. COMPARISON:  12/25/2017 FINDINGS: CT HEAD FINDINGS Brain: There is atrophy and chronic small vessel disease changes. No acute intracranial abnormality. Specifically, no hemorrhage, hydrocephalus, mass lesion, acute infarction, or significant intracranial injury. Vascular: No hyperdense vessel or unexpected calcification. Skull: No acute calvarial abnormality. Sinuses/Orbits: Diffuse mucosal thickening throughout the ethmoid air cells and right sphenoid sinus. Mastoid air cells clear. No orbital soft tissue abnormality. Other: None CT CERVICAL SPINE FINDINGS Alignment: No subluxation Skull base and vertebrae: No acute fracture. No primary bone lesion or focal pathologic process. Soft tissues and spinal canal: No prevertebral fluid or swelling. No visible canal hematoma. Disc levels: Prior anterior fusion from C3-C5. Diffuse degenerative disc and facet disease. Upper chest: Emphysematous changes in the apices. No acute findings. Other: Carotid artery calcifications bilaterally. IMPRESSION: No acute intracranial abnormality. Atrophy, chronic microvascular disease. Prior anterior fusion from C3-C5. Diffuse degenerative disc and facet disease in the cervical spine. No acute bony abnormality. Electronically Signed   By: Rolm Baptise M.D.   On: 01/31/2018 19:42   Ct Cervical Spine Wo Contrast  Result Date: 01/31/2018 CLINICAL DATA:  Found unresponsive EXAM: CT HEAD WITHOUT CONTRAST CT CERVICAL SPINE WITHOUT CONTRAST TECHNIQUE: Multidetector CT imaging of the head and cervical spine was performed following the standard protocol without intravenous contrast. Multiplanar CT image reconstructions of the cervical spine were also generated.  COMPARISON:  12/25/2017 FINDINGS: CT HEAD FINDINGS Brain: There is atrophy and chronic small vessel disease changes. No acute intracranial abnormality. Specifically, no hemorrhage, hydrocephalus, mass lesion, acute infarction, or significant intracranial injury. Vascular: No hyperdense vessel or unexpected calcification. Skull: No acute calvarial abnormality. Sinuses/Orbits: Diffuse mucosal thickening throughout the ethmoid air cells and right sphenoid sinus. Mastoid air cells clear. No orbital soft tissue abnormality. Other: None CT CERVICAL SPINE FINDINGS Alignment: No subluxation Skull base and vertebrae: No acute fracture. No primary bone lesion or focal pathologic process. Soft tissues and spinal canal: No prevertebral fluid or swelling. No visible canal hematoma. Disc levels: Prior anterior fusion from C3-C5. Diffuse degenerative disc and facet disease. Upper chest: Emphysematous changes in the apices. No acute findings. Other: Carotid artery calcifications bilaterally. IMPRESSION: No acute intracranial abnormality. Atrophy, chronic microvascular disease. Prior anterior fusion from C3-C5. Diffuse degenerative disc and facet disease in the cervical spine. No acute bony abnormality. Electronically Signed   By: Rolm Baptise M.D.   On: 01/31/2018 19:42   Dg Chest Port 1 View  Result Date: 01/31/2018 CLINICAL DATA:  Intubated EXAM: PORTABLE CHEST 1 VIEW COMPARISON:  Chest radiograph from earlier today. FINDINGS: Endotracheal tube tip is 14.0 cm above the carina. Enteric tube coils in cervical esophagus and enters the stomach with the tip not seen on this image. Stable cardiomediastinal silhouette with normal heart size. No pneumothorax. No pleural effusion. No pulmonary edema. Mild left basilar scarring versus atelectasis. No acute consolidative airspace disease. IMPRESSION: 1. Endotracheal tube tip 14.0 cm above the carina, recommend advancing. 2. Enteric tube coils in the cervical esophagus and  enters the  stomach with the tip not seen on this image. 3. Mild left basilar scarring versus atelectasis. These results were called by telephone at the time of interpretation on 01/31/2018 at 6:30 pm to Dr. Noemi Chapel , who verbally acknowledged these results. Electronically Signed   By: Ilona Sorrel M.D.   On: 01/31/2018 18:32   Dg Chest Port 1 View  Result Date: 01/31/2018 CLINICAL DATA:  Unconscious patient. EXAM: PORTABLE CHEST 1 VIEW COMPARISON:  01/26/2018 FINDINGS: Lower portions of the thorax are excluded by collimation. Interval removal of dialysis catheter. Mildly enlarged cardiac silhouette. Mediastinal contours appear intact. There is no evidence of focal airspace consolidation, pleural effusion or pneumothorax. Osseous structures are without acute abnormality. Soft tissues are grossly normal. IMPRESSION: Mildly enlarged cardiac silhouette. Otherwise no acute abnormalities, accounting for excluding of the lower thorax. Electronically Signed   By: Fidela Salisbury M.D.   On: 01/31/2018 17:49    Scheduled Meds: . chlorhexidine gluconate (MEDLINE KIT)  15 mL Mouth Rinse BID  . furosemide  80 mg Intravenous BID  . mouth rinse  15 mL Mouth Rinse 10 times per day   Continuous Infusions: . sodium chloride 10 mL/hr (02/01/18 2045)  . heparin 1,700 Units/hr (02/01/18 1824)  . piperacillin-tazobactam (ZOSYN)  IV 3.375 g (02/01/18 2046)  . propofol (DIPRIVAN) infusion 25 mcg/kg/min (02/01/18 1902)  . [START ON 02/02/2018] vancomycin       LOS: 1 day    Time spent: 35 minutes. Greater than 50% of this time was spent in direct contact with the patient, coordinating care with staff and discussing relevant ongoing clinical issues. Patient remained intubated, mechanically ventilated an   Barton Dubois, MD [facility] Pager [phone]  If 7PM-7AM, please contact night-coverage [email or web address] Password [email or web address] 02/01/2018, 10:07 PM

## 2018-02-01 NOTE — Consult Note (Signed)
Reason for Consult: Renal failure Referring Physician: Dr. [name] is an 62 y.o. male.  HPI: He is a patient who has history of coronary artery disease, hepatitis C, acute kidney injury superimposed on chronic requiring dialysis for some time.  Since his renal function improved initially dialysis was discontinued.  However after a while his renal function declined and hemodialysis was initiated.  Patient very noncompliant with his dialysis and treatment.  Recently he was admitted to [facility] with paraspinal abscess status post drainage.  He was put on IV antibiotics but left AGAINST MEDICAL ADVICE and did not keep his appointment either with the nephrologist or hemodialysis unit.  Then he was readmitted again and discharged on 01/29/2018 to be followed as an outpatient.  Presently patient is admitted because of altered mental status and intubated.  Past Medical History:  Diagnosis Date  . Anxiety   . Arthritis    knees , back , shoulders (10/12/2017)  . Atrial fibrillation ([place])   . CAD (coronary artery disease)    Mild nonobstructive disease at cardiac catheterization 2002  . Chronic back pain    "back of my neck; down thru my legs" (10/12/2017)  . COPD (chronic obstructive pulmonary disease) ([place])   . Diverticulitis   . Esophageal reflux   . ESRD (end stage renal disease) on dialysis [facility])    "TTS; [place]" (11/23/2017)  . Essential hypertension   . Hepatitis C    states he no longer has this  . History of kidney stones   . History of syncope   . Hyperlipidemia   . Jerking 09/23/2014  . Myocardial infarction ([place]) 02/2017   "light one" (10/12/2017)  . Non-compliant behavior    non compliant with diaylsis per daughter  . PAT (paroxysmal atrial tachycardia) ([place])   . Peripheral arterial disease ([place])    Occluded left superficial femoral artery status post stent June 2016 - Dr. Trula Slade  . Pneumonia 1961  . Polycystic kidney, unspecified type   . Syncope 09/2014  .  SYNCOPE 05/07/2010   Qualifier: Diagnosis of  By: Laurance Flatten RN, BSN, Anderson Malta      Past Surgical History:  Procedure Laterality Date  . ABDOMINAL AORTOGRAM N/A 08/11/2017   Procedure: ABDOMINAL AORTOGRAM;  Surgeon: Serafina Mitchell, MD;  Location: [place] [facility];  Service: Cardiovascular;  Laterality: N/A;  . ANTERIOR CERVICAL DECOMP/DISCECTOMY FUSION    . APPLICATION OF WOUND VAC Right 08/14/2017   Procedure: APPLICATION OF PREVENA INCISIONAL WOUND VAC RIGHT GROIN;  Surgeon: Serafina Mitchell, MD;  [facility];  Service: Vascular;  Laterality: Right;  . AV FISTULA PLACEMENT Left 09/04/2017   Procedure: ARTERIOVENOUS (AV) FISTULA CREATION LEFT UPPER ARM;  Surgeon: Serafina Mitchell, MD;  Location: [place];  Service: Vascular;  Laterality: Left;  . BACK SURGERY    . BASCILIC VEIN TRANSPOSITION Left 11/20/2017   Procedure: SECOND STAGE BASILIC VEIN TRANSPOSITION LEFT ARM;  Surgeon: Serafina Mitchell, MD;  Location: [place];  Service: Vascular;  Laterality: Left;  . BIOPSY  12/17/2016   Procedure: BIOPSY;  Surgeon: Daneil Dolin, MD;  [facility];  Service: Gastroenterology;;  gastric colon  . BIOPSY  04/29/2017   Procedure: BIOPSY;  Surgeon: Daneil Dolin, MD;  [facility];  Service: Endoscopy;;  duodenal biopsies  . BUNIONECTOMY Bilateral   . COLONOSCOPY  2008   Dr. Oneida Alar: rare sigmoid colon diverticulosis, internal hemorrhoids.   . COLONOSCOPY WITH PROPOFOL N/A 12/17/2016  dense left-sided diverticulosis, right colon ulcers Aaron/p biopsy query occult NSAID use vs transient ischemia, not consistent with IBD. CMV stains negative.   Marland Kitchen ENDARTERECTOMY FEMORAL Right 08/14/2017   Procedure: RIGHT ILLIO-FEMORAL ENDARTERECTOMY;  Surgeon: Serafina Mitchell, MD;  Location: [facility] OR;  Service: Vascular;  Laterality: Right;  . ESOPHAGEAL DILATION  12/17/2016   EGD with mild Schatzki'Aaron ring Aaron/p dilatation, small hiatal hernia, erosive gastropathy (negative H.pylori gastritis)  .  ESOPHAGOGASTRODUODENOSCOPY  2008   Dr. Oneida Alar: normal esophagus without Barrett'Aaron, antritis and duodenitis, path with H.pylori gastritis  . ESOPHAGOGASTRODUODENOSCOPY (EGD) WITH PROPOFOL N/A 12/17/2016   Procedure: ESOPHAGOGASTRODUODENOSCOPY (EGD) WITH PROPOFOL;  Surgeon: Daneil Dolin, MD;  [facility];  Service: Gastroenterology;  Laterality: N/A;  . ESOPHAGOGASTRODUODENOSCOPY (EGD) WITH PROPOFOL N/A 04/29/2017   Patchy erythema of gastric mucosa diffusely, extensive inflammatory changes in duodenum, geographic ulceration and mucosal edema present, encroaching somewhat on the lumen yet still widely patent, distal second portion of duodenum appeared abnormal, path with peptic duodenitis with ulceration  . GROIN DEBRIDEMENT Right 10/14/2017   Procedure: RIGHT GROIN AND RIGHT LOWER QUADRANT ABDOMEN DEBRIDEMENT WITH PLACEMENT OF ANTIBIOTIC BEADS;  Surgeon: Serafina Mitchell, MD;  Location: [place];  Service: Vascular;  Laterality: Right;  . HEMATOMA EVACUATION Left 12/06/2017   Procedure: EVACUATION HEMATOMA;  Surgeon: Waynetta Sandy, MD;  Location: [place];  Service: Vascular;  Laterality: Left;  . HERNIA REPAIR  1884   umbilical  . I&D EXTREMITY Right 09/21/2017   Procedure: IRRIGATION AND DEBRIDEMENT GROIN;  Surgeon: Elam Dutch, MD;  Location: [facility];  Service: Vascular;  Laterality: Right;  . I&D EXTREMITY Left 11/23/2017   Procedure: Evacuation Hematoma LEFT UPPER ARM GRAFT;  Surgeon: Elam Dutch, MD;  Location: [place];  Service: Vascular;  Laterality: Left;  . INGUINAL HERNIA REPAIR Right 02/2017   [place]  . INSERTION OF DIALYSIS CATHETER Right 09/04/2017   Procedure: INSERTION OF TUNNELED  DIALYSIS CATHETER - RIGHT INTERNAL JUGULAR PLACEMENT;  Surgeon: Serafina Mitchell, MD;  Location: [place];  Service: Vascular;  Laterality: Right;  . INSERTION OF ILIAC STENT Right 08/14/2017   Procedure: INSERTION OF RIGHT COMMON ILIAC STENT 67m x 827mx 130cm INSERTION OF RIGHT  EXTERNAL ILIAC STENT 54m65m 11m3m130cm INSERTION OF SUPERFICIAL FERMORAL ARTERY STENT 7mm 66m0mm 65m0cm;  Surgeon: BrabhaSerafina Mitchell [facility];  Laterality: Right;  . IR FLUORO GUIDE CV LINE RIGHT  08/31/2017  . IR US [place] VASC ACCESS RIGHT  08/31/2017  . LOWER EXTREMITY ANGIOGRAPHY Right 08/11/2017   Procedure: LOWER EXTREMITY ANGIOGRAPHY;  Surgeon: BrabhaSerafina Mitchell [facility];  Service: Cardiovascular;  Laterality: Right;  . PATCH ANGIOPLASTY Right 08/14/2017   Procedure: PATCH ANGIOPLASTY USING HEMASHIELD PATCH 0.3IN X 6IN;Lillie Columbiageon: BrabhaSerafina Mitchell Location: [facility];  Service: Vascular;  Laterality: Right;  . PERIPHERAL VASCULAR CATHETERIZATION N/A 09/20/2014   Procedure: Abdominal Aortogram;  Surgeon: Vance Serafina Mitchell [facility];  Service: Cardiovascular;  Laterality: N/A;  . POSTERIOR FUSION LUMBAR SPINE    . TEE WITHOUT CARDIOVERSION N/A 01/12/2018   Procedure: TRANSESOPHAGEAL ECHOCARDIOGRAM (TEE);  Surgeon: CrenshLelon Perla [facility];  Service: Cardiovascular;  Laterality: N/A;    Family History  Problem Relation Age of Onset  . Alcoholism Mother   . Heart disease Father        Massive heart  attack  . Heart attack Father   . Atrial fibrillation Father   . Colon cancer Father   . Colon cancer Maternal Grandfather 56  . Alcoholism Maternal Grandfather   . Renal cancer Cousin   . Ovarian cancer Sister     Social History:  reports that he has been smoking cigarettes. He has a 94.00 pack-year smoking history. He has never used smokeless tobacco. He reports that he drank alcohol. He reports that he has current or past drug history.  Allergies: No Known Allergies  Medications: I have reviewed the patient'Aaron current medications.  Results for orders placed or performed during the hospital encounter of 01/31/18 (from the past 48 hour(Aaron))  CBC with Differential/Platelet     Status:  Abnormal   Collection Time: 01/31/18  3:11 PM  Result Value Ref Range   WBC 17.1 (H) 4.0 - 10.5 K/uL   RBC 3.42 (L) 4.22 - 5.81 MIL/uL   Hemoglobin 10.3 (L) 13.0 - 17.0 g/dL   HCT 33.0 (L) 39.0 - 52.0 %   MCV 96.5 80.0 - 100.0 fL   MCH 30.1 26.0 - 34.0 pg   MCHC 31.2 30.0 - 36.0 g/dL   RDW 17.9 (H) 11.5 - 15.5 %   Platelets 340 150 - 400 K/uL   nRBC 0.0 0.0 - 0.2 %   Neutrophils Relative % 91 %   Neutro Abs 15.5 (H) 1.7 - 7.7 K/uL   Lymphocytes Relative 2 %   Lymphs Abs 0.4 (L) 0.7 - 4.0 K/uL   Monocytes Relative 7 %   Monocytes Absolute 1.2 (H) 0.1 - 1.0 K/uL   Eosinophils Relative 0 %   Eosinophils Absolute 0.0 0.0 - 0.5 K/uL   Basophils Relative 0 %   Basophils Absolute 0.0 0.0 - 0.1 K/uL   Immature Granulocytes 0 %   Abs Immature Granulocytes 0.07 0.00 - 0.07 K/uL    Comment: Performed at [facility], [address] [place], [place] [address]  Comprehensive metabolic panel     Status: Abnormal   Collection Time: 01/31/18  3:11 PM  Result Value Ref Range   Sodium 134 (L) 135 - 145 mmol/L   Potassium 4.6 3.5 - 5.1 mmol/L   Chloride 103 98 - 111 mmol/L   CO2 21 (L) 22 - 32 mmol/L   Glucose, Bld 137 (H) 70 - 99 mg/dL   BUN 49 (H) 8 - 23 mg/dL   Creatinine, Ser 4.82 (H) 0.61 - 1.24 mg/dL   Calcium 9.1 8.9 - 10.3 mg/dL   Total Protein 7.6 6.5 - 8.1 g/dL   Albumin 3.1 (L) 3.5 - 5.0 g/dL   AST 21 15 - 41 U/L   ALT 9 0 - 44 U/L   Alkaline Phosphatase 116 38 - 126 U/L   Total Bilirubin 0.8 0.3 - 1.2 mg/dL   GFR calc non Af Amer 12 (L) >60 mL/min   GFR calc Af Amer 14 (L) >60 mL/min    Comment: (NOTE) The eGFR has been calculated using the CKD EPI equation. This calculation has not been validated in all clinical situations. eGFR'Aaron persistently <60 mL/min signify possible Chronic Kidney Disease.    Anion gap 10 5 - 15    Comment: Performed at [facility], [address], [place], [place] [address]  Protime-INR     Status: Abnormal   Collection Time: 01/31/18  3:11 PM   Result Value Ref Range   Prothrombin Time 16.7 (H) 11.4 - 15.2 seconds   INR 1.37  Comment: Performed at [facility], [address] [place], [place] [address]  APTT     Status: None   Collection Time: 01/31/18  3:11 PM  Result Value Ref Range   aPTT 32 24 - 36 seconds    Comment: Performed at [facility], [address], [place], [place] [address]  CK     Status: None   Collection Time: 01/31/18  5:11 PM  Result Value Ref Range   Total CK 139 49 - 397 U/L    Comment: Performed at [facility], [address], [place], [place] [address]  Blood gas, arterial     Status: Abnormal   Collection Time: 01/31/18  5:25 PM  Result Value Ref Range   FIO2 100.00    Delivery systems VENTILATOR    Mode PRESSURE REGULATED VOLUME CONTROL    VT 500 mL   LHR 22 resp/min   Peep/cpap 5.0 cm H20   pH, Arterial 7.312 (L) 7.350 - 7.450   pCO2 arterial 40.3 32.0 - 48.0 mmHg   pO2, Arterial 63.7 (L) 83.0 - 108.0 mmHg   Bicarbonate 19.7 (L) 20.0 - 28.0 mmol/L   Acid-base deficit 5.4 (H) 0.0 - 2.0 mmol/L   O2 Saturation 87.8 %   Collection site REVIEWED BY    Drawn by [id number]    Sample type ARTERIAL    Allens test (pass/fail) NOT INDICATED (A) PASS    Comment: Performed at [facility], [address] [place], [place] [address]  Carboxyhemoglobin (single result)     Status: Abnormal   Collection Time: 01/31/18  5:25 PM  Result Value Ref Range   Carboxyhemoglobin 1.8 (H) 0.5 - 1.5 %    Comment: Performed at [facility], [address], [place], [place] [address]  Urinalysis, Routine w reflex microscopic     Status: Abnormal   Collection Time: 01/31/18  5:26 PM  Result Value Ref Range   Color, Urine YELLOW YELLOW   APPearance CLEAR CLEAR   Specific Gravity, Urine 1.012 1.005 - 1.030   pH 5.0 5.0 - 8.0   Glucose, UA NEGATIVE NEGATIVE mg/dL   Hgb urine dipstick SMALL (A) NEGATIVE   Bilirubin Urine NEGATIVE NEGATIVE   Ketones, ur NEGATIVE NEGATIVE mg/dL   Protein, ur 30 (A) NEGATIVE mg/dL    Nitrite NEGATIVE NEGATIVE   Leukocytes, UA NEGATIVE NEGATIVE   RBC / HPF 0-5 0 - 5 RBC/hpf   WBC, UA 0-5 0 - 5 WBC/hpf   Bacteria, UA RARE (A) NONE SEEN    Comment: Performed at [facility], [address], [place],  [address]  Rapid urine drug screen (hospital performed)     Status: Abnormal   Collection Time: 01/31/18  5:26 PM  Result Value Ref Range   Opiates POSITIVE (A) NONE DETECTED   Cocaine NONE DETECTED NONE DETECTED   Benzodiazepines NONE DETECTED NONE DETECTED   Amphetamines NONE DETECTED NONE DETECTED   Tetrahydrocannabinol NONE DETECTED NONE DETECTED   Barbiturates NONE DETECTED NONE DETECTED    Comment: (NOTE) DRUG SCREEN FOR MEDICAL PURPOSES ONLY.  IF CONFIRMATION IS NEEDED FOR ANY PURPOSE, NOTIFY LAB WITHIN 5 DAYS. LOWEST DETECTABLE LIMITS FOR URINE DRUG SCREEN Drug Class                     Cutoff (ng/mL) Amphetamine and metabolites    1000 Barbiturate and metabolites    200 Benzodiazepine                 480 Tricyclics  and metabolites     300 Opiates and metabolites        300 Cocaine and metabolites        300 THC                            50 Performed at [facility], [address], [place], [place] [address]   I-Stat CG4 Lactic Acid, ED     Status: Abnormal   Collection Time: 01/31/18  5:42 PM  Result Value Ref Range   Lactic Acid, Venous 2.17 (HH) 0.5 - 1.9 mmol/L   Comment NOTIFIED PHYSICIAN   I-stat chem 8, ed     Status: Abnormal   Collection Time: 01/31/18  5:42 PM  Result Value Ref Range   Sodium 136 135 - 145 mmol/L   Potassium 5.8 (H) 3.5 - 5.1 mmol/L   Chloride 105 98 - 111 mmol/L   BUN 69 (H) 8 - 23 mg/dL   Creatinine, Ser 5.30 (H) 0.61 - 1.24 mg/dL   Glucose, Bld 121 (H) 70 - 99 mg/dL   Calcium, Ion 1.16 1.15 - 1.40 mmol/L   TCO2 24 22 - 32 mmol/L   Hemoglobin 11.2 (L) 13.0 - 17.0 g/dL   HCT 33.0 (L) 39.0 - 52.0 %  Culture, blood (Routine X 2) w Reflex to ID Panel     Status: None (Preliminary result)   Collection Time:  01/31/18  7:05 PM  Result Value Ref Range   Specimen Description      BLOOD RIGHT HAND BOTTLES DRAWN AEROBIC ONLY Blood Culture adequate volume   Special Requests NONE    Culture      NO GROWTH < 12 HOURS Performed at [facility], [address] [place], [place] [address]    Report Status PENDING   Culture, blood (Routine X 2) w Reflex to ID Panel     Status: None (Preliminary result)   Collection Time: 01/31/18  7:10 PM  Result Value Ref Range   Specimen Description      BLOOD LEFT HAND Blood Culture adequate volume BOTTLES DRAWN AEROBIC ONLY   Special Requests NONE    Culture      NO GROWTH < 12 HOURS Performed at [facility], [address] [place], [place] [address]    Report Status PENDING   Lactic acid, plasma     Status: None   Collection Time: 01/31/18 10:58 PM  Result Value Ref Range   Lactic Acid, Venous 1.1 0.5 - 1.9 mmol/L    Comment: Performed at [facility], [address] [place], [place] [address]  Troponin I (q 6hr x 3)     Status: Abnormal   Collection Time: 01/31/18 10:58 PM  Result Value Ref Range   Troponin I 0.03 (HH) <0.03 ng/mL    Comment: CRITICAL RESULT CALLED TO, READ BACK BY AND VERIFIED WITH: DANIELS,J @ 0032 ON 02/01/18 BY JUW Performed at [facility], [address], [place], [place] [address]   Basic metabolic panel     Status: Abnormal   Collection Time: 01/31/18 10:58 PM  Result Value Ref Range   Sodium 138 135 - 145 mmol/L   Potassium 4.4 3.5 - 5.1 mmol/L    Comment: DELTA CHECK NOTED   Chloride 108 98 - 111 mmol/L   CO2 20 (L) 22 - 32 mmol/L   Glucose, Bld 114 (H) 70 - 99 mg/dL   BUN 46 (H) 8 - 23 mg/dL  Creatinine, Ser 4.61 (H) 0.61 - 1.24 mg/dL   Calcium 8.4 (L) 8.9 - 10.3 mg/dL   GFR calc non Af Amer 12 (L) >60 mL/min   GFR calc Af Amer 14 (L) >60 mL/min    Comment: (NOTE) The eGFR has been calculated using the CKD EPI equation. This calculation has not been validated in all clinical situations. eGFR'Aaron persistently <60  mL/min signify possible Chronic Kidney Disease.    Anion gap 10 5 - 15    Comment: Performed at [facility], [address], [place], [place] [address]  Triglycerides     Status: None   Collection Time: 01/31/18 10:58 PM  Result Value Ref Range   Triglycerides 63 <150 mg/dL    Comment: Performed at [facility], [address], [place], [place] [address]  Heparin level (unfractionated)     Status: Abnormal   Collection Time: 02/01/18  5:13 [name]  Result Value Ref Range   Heparin Unfractionated <0.10 (L) 0.30 - 0.70 IU/mL    Comment: (NOTE) If heparin results are below expected values, and patient dosage has  been confirmed, suggest follow up testing of antithrombin III levels. Performed at [facility], [address] [place], [place] [address]   Troponin I (q 6hr x 3)     Status: Abnormal   Collection Time: 02/01/18  5:13 [name]  Result Value Ref Range   Troponin I 0.04 (HH) <0.03 ng/mL    Comment: CRITICAL RESULT CALLED TO, READ BACK BY AND VERIFIED WITH: JEFFERSON,V AT 5:50AM ON 02/01/18 BY [facility] Performed at [facility], [address] [place], [place] [address]   Comprehensive metabolic panel     Status: Abnormal   Collection Time: 02/01/18  5:13 [name]  Result Value Ref Range   Sodium 138 135 - 145 mmol/L   Potassium 5.0 3.5 - 5.1 mmol/L   Chloride 109 98 - 111 mmol/L   CO2 18 (L) 22 - 32 mmol/L   Glucose, Bld 95 70 - 99 mg/dL   BUN 48 (H) 8 - 23 mg/dL   Creatinine, Ser 4.64 (H) 0.61 - 1.24 mg/dL   Calcium 8.5 (L) 8.9 - 10.3 mg/dL   Total Protein 6.2 (L) 6.5 - 8.1 g/dL   Albumin 2.5 (L) 3.5 - 5.0 g/dL   AST 20 15 - 41 U/L   ALT 9 0 - 44 U/L   Alkaline Phosphatase 84 38 - 126 U/L   Total Bilirubin 0.8 0.3 - 1.2 mg/dL   GFR calc non Af Amer 12 (L) >60 mL/min   GFR calc Af Amer 14 (L) >60 mL/min    Comment: (NOTE) The eGFR has been calculated using the CKD EPI equation. This calculation has not been validated in all clinical situations. eGFR'Aaron persistently <60 mL/min  signify possible Chronic Kidney Disease.    Anion gap 11 5 - 15    Comment: Performed at [facility], [address] [place], [place] [address]  Blood gas, arterial     Status: Abnormal   Collection Time: 02/01/18  5:50 [name]  Result Value Ref Range   FIO2 35.00    Delivery systems VENTILATOR    Mode PRESSURE REGULATED VOLUME CONTROL    VT 690 mL   LHR 21 resp/min   Peep/cpap 5.0 cm H20   pH, Arterial 7.469 (H) 7.350 - 7.450   pCO2 arterial 23.7 (L) 32.0 - 48.0 mmHg   pO2, Arterial 105.0 83.0 - 108.0 mmHg   Bicarbonate 20.2 20.0 - 28.0 mmol/L   Acid-base  deficit 6.0 (H) 0.0 - 2.0 mmol/L   O2 Saturation 98.2 %   Collection site RADIAL    Drawn by [id number]    Sample type ARTERIAL    Allens test (pass/fail) PASS PASS   Mechanical Rate 21     Comment: Performed at [facility], [address], [place], [place] [address]  Glucose, capillary     Status: None   Collection Time: 02/01/18  7:37 [name]  Result Value Ref Range   Glucose-Capillary 72 70 - 99 mg/dL    Ct Head Wo Contrast  Result Date: 01/31/2018 CLINICAL DATA:  Found unresponsive EXAM: CT HEAD WITHOUT CONTRAST CT CERVICAL SPINE WITHOUT CONTRAST TECHNIQUE: Multidetector CT imaging of the head and cervical spine was performed following the standard protocol without intravenous contrast. Multiplanar CT image reconstructions of the cervical spine were also generated. COMPARISON:  12/25/2017 FINDINGS: CT HEAD FINDINGS Brain: There is atrophy and chronic small vessel disease changes. No acute intracranial abnormality. Specifically, no hemorrhage, hydrocephalus, mass lesion, acute infarction, or significant intracranial injury. Vascular: No hyperdense vessel or unexpected calcification. Skull: No acute calvarial abnormality. Sinuses/Orbits: Diffuse mucosal thickening throughout the ethmoid air cells and right sphenoid sinus. Mastoid air cells clear. No orbital soft tissue abnormality. Other: None CT CERVICAL SPINE FINDINGS Alignment: No  subluxation Skull base and vertebrae: No acute fracture. No primary bone lesion or focal pathologic process. Soft tissues and spinal canal: No prevertebral fluid or swelling. No visible canal hematoma. Disc levels: Prior anterior fusion from C3-C5. Diffuse degenerative disc and facet disease. Upper chest: Emphysematous changes in the apices. No acute findings. Other: Carotid artery calcifications bilaterally. IMPRESSION: No acute intracranial abnormality. Atrophy, chronic microvascular disease. Prior anterior fusion from C3-C5. Diffuse degenerative disc and facet disease in the cervical spine. No acute bony abnormality. Electronically Signed   By: Rolm Baptise M.D.   On: 01/31/2018 19:42   Ct Cervical Spine Wo Contrast  Result Date: 01/31/2018 CLINICAL DATA:  Found unresponsive EXAM: CT HEAD WITHOUT CONTRAST CT CERVICAL SPINE WITHOUT CONTRAST TECHNIQUE: Multidetector CT imaging of the head and cervical spine was performed following the standard protocol without intravenous contrast. Multiplanar CT image reconstructions of the cervical spine were also generated. COMPARISON:  12/25/2017 FINDINGS: CT HEAD FINDINGS Brain: There is atrophy and chronic small vessel disease changes. No acute intracranial abnormality. Specifically, no hemorrhage, hydrocephalus, mass lesion, acute infarction, or significant intracranial injury. Vascular: No hyperdense vessel or unexpected calcification. Skull: No acute calvarial abnormality. Sinuses/Orbits: Diffuse mucosal thickening throughout the ethmoid air cells and right sphenoid sinus. Mastoid air cells clear. No orbital soft tissue abnormality. Other: None CT CERVICAL SPINE FINDINGS Alignment: No subluxation Skull base and vertebrae: No acute fracture. No primary bone lesion or focal pathologic process. Soft tissues and spinal canal: No prevertebral fluid or swelling. No visible canal hematoma. Disc levels: Prior anterior fusion from C3-C5. Diffuse degenerative disc and facet  disease. Upper chest: Emphysematous changes in the apices. No acute findings. Other: Carotid artery calcifications bilaterally. IMPRESSION: No acute intracranial abnormality. Atrophy, chronic microvascular disease. Prior anterior fusion from C3-C5. Diffuse degenerative disc and facet disease in the cervical spine. No acute bony abnormality. Electronically Signed   By: Rolm Baptise M.D.   On: 01/31/2018 19:42   Dg Chest Port 1 View  Result Date: 01/31/2018 CLINICAL DATA:  Intubated EXAM: PORTABLE CHEST 1 VIEW COMPARISON:  Chest radiograph from earlier today. FINDINGS: Endotracheal tube tip is 14.0 cm above the carina. Enteric tube coils in cervical esophagus and enters the stomach  with the tip not seen on this image. Stable cardiomediastinal silhouette with normal heart size. No pneumothorax. No pleural effusion. No pulmonary edema. Mild left basilar scarring versus atelectasis. No acute consolidative airspace disease. IMPRESSION: 1. Endotracheal tube tip 14.0 cm above the carina, recommend advancing. 2. Enteric tube coils in the cervical esophagus and enters the stomach with the tip not seen on this image. 3. Mild left basilar scarring versus atelectasis. These results were called by telephone at the time of interpretation on 01/31/2018 at 6:30 pm to Dr. Noemi Chapel , who verbally acknowledged these results. Electronically Signed   By: Ilona Sorrel M.D.   On: 01/31/2018 18:32   Dg Chest Port 1 View  Result Date: 01/31/2018 CLINICAL DATA:  Unconscious patient. EXAM: PORTABLE CHEST 1 VIEW COMPARISON:  01/26/2018 FINDINGS: Lower portions of the thorax are excluded by collimation. Interval removal of dialysis catheter. Mildly enlarged cardiac silhouette. Mediastinal contours appear intact. There is no evidence of focal airspace consolidation, pleural effusion or pneumothorax. Osseous structures are without acute abnormality. Soft tissues are grossly normal. IMPRESSION: Mildly enlarged cardiac silhouette.  Otherwise no acute abnormalities, accounting for excluding of the lower thorax. Electronically Signed   By: Fidela Salisbury M.D.   On: 01/31/2018 17:49    ROS Blood pressure (!) 121/97, pulse 80, temperature (!) 97.5 F (36.4 C), temperature source Axillary, resp. rate (!) 21, height '6\' 4"'  (1.93 m), weight 85 kg, SpO2 (!) 89 %. Physical Exam  Eyes: No scleral icterus.  Neck: No JVD present.  Cardiovascular:  No murmur heard. Respiratory: No respiratory distress. He has no wheezes. He has no rales.  GI: He exhibits no distension. There is no tenderness.  Musculoskeletal: He exhibits no edema.    Assessment/Plan: 1] renal failure: Stage V.  Patient was nonoliguric during his last admission and his creatinine remains stable around 4-4.5..  Hence his dialysis was discontinued to be followed as an outpatient.  Presently his renal function still remains a stable. 2] altered mental status: Etiology not clear 3] atrial fibrillation: His heart rate is controlled 4] history of paraspinal abscess status post drainage.  Patient was initially treated with IV antibiotics but because of his noncompliance he was put on p.o. antibiotics to be followed as an outpatient.  Not sure whether he has any out patient follow-up by any nephrologist as he is not coming to my office. 5] coronary artery disease 6] hep C infection 7] history of COPD Plan: 1] we will start patient on Lasix 80 mg IV twice daily 2] patient presently does not require dialysis 3] we will follow his renal panel and CBC 4] we will continue with IV fluid at 125 cc/h  [facility] Aaron [name], Aaron [name]

## 2018-02-01 NOTE — Progress Notes (Signed)
ANTIBIOTIC CONSULT NOTE-Preliminary  Pharmacy Consult for Vancomycin and Zosyn Indication: Pneumonia  No Known Allergies  Patient Measurements: Height: 6\' 4"  (193 cm) Weight: 187 lb 6.3 oz (85 kg) IBW/kg (Calculated) : 86.8  Vital Signs: Temp: 99.9 F (37.7 C) (10/20 2300) BP: 83/72 (10/21 0300) Pulse Rate: 68 (10/21 0300)  Labs: Recent Labs    01/29/18 0533 01/31/18 1511 01/31/18 1742 01/31/18 2258  WBC 7.6 17.1*  --   --   HGB 8.4* 10.3* 11.2*  --   PLT 244 340  --   --   CREATININE 4.96* 4.82* 5.30* 4.61*    Estimated Creatinine Clearance: 20.2 mL/min (A) (by C-G formula based on SCr of 4.61 mg/dL (H)).  Recent Labs    01/29/18 0533  [facility] 18     Microbiology: Recent Results (from the past 720 hour(s))  Culture, blood (Routine X 2) w Reflex to ID Panel     Status: None   Collection Time: 01/08/18  6:55 PM  Result Value Ref Range Status   Specimen Description BLOOD RIGHT FOREARM  Final   Special Requests   Final    BOTTLES DRAWN AEROBIC AND ANAEROBIC Blood Culture results may not be optimal due to an inadequate volume of blood received in culture bottles   Culture   Final    NO GROWTH 5 DAYS Performed at [facility], [place] [address], [place], [place] [address]    Report Status 01/13/2018 FINAL  Final  Culture, blood (routine x 2)     Status: None   Collection Time: 01/09/18  3:25 PM  Result Value Ref Range Status   Specimen Description RIGHT ANTECUBITAL  Final   Special Requests   Final    BOTTLES DRAWN AEROBIC AND ANAEROBIC Blood Culture adequate volume   Culture   Final    NO GROWTH 5 DAYS Performed at [facility], [address], [place], [place] [address]    Report Status 01/14/2018 FINAL  Final  Culture, blood (routine x 2)     Status: None   Collection Time: 01/09/18  3:49 PM  Result Value Ref Range Status   Specimen Description BLOOD RIGHT ARM  Final   Special Requests   Final    BOTTLES DRAWN AEROBIC AND ANAEROBIC Blood Culture  adequate volume   Culture   Final    NO GROWTH 5 DAYS Performed at [facility], [address] [place], [place] [address]    Report Status 01/14/2018 FINAL  Final  MRSA PCR Screening     Status: None   Collection Time: 01/09/18  8:55 PM  Result Value Ref Range Status   MRSA by PCR NEGATIVE NEGATIVE Final    Comment:        The GeneXpert MRSA Assay (FDA approved for NASAL specimens only), is one component of a comprehensive MRSA colonization surveillance program. It is not intended to diagnose MRSA infection nor to guide or monitor treatment for MRSA infections. Performed at [facility], [place] [address] [place], [place] [address]   Aerobic/Anaerobic Culture (surgical/deep wound)     Status: Abnormal   Collection Time: 01/10/18 11:17 AM  Result Value Ref Range Status   Specimen Description ABSCESS  Final   Special Requests NONE  Final   Gram Stain   Final    RARE WBC PRESENT,BOTH PMN AND MONONUCLEAR NO ORGANISMS SEEN    Culture (A)  Final    BACILLUS SPECIES NO ANAEROBES ISOLATED Performed at [facility], [address] [place], [place] [address]    Report Status 01/15/2018 FINAL  Final  Culture, blood (routine x 2)     Status: None   Collection Time: 01/26/18 10:47 PM  Result Value Ref Range Status   Specimen Description BLOOD RIGHT ARM  Final   Special Requests   Final    BOTTLES DRAWN AEROBIC AND ANAEROBIC ANA BCAV AEB [place]   Culture   Final    NO GROWTH 5 DAYS Performed at [facility], [address], [place], [place] [address]    Report Status 01/31/2018 FINAL  Final  Culture, blood (routine x 2)     Status: None   Collection Time: 01/26/18 10:47 PM  Result Value Ref Range Status   Specimen Description BLOOD RIGHT ARM  Final   Special Requests   Final    BOTTLES DRAWN AEROBIC AND ANAEROBIC ANA BCAV AEB [place]   Culture   Final    NO GROWTH 5 DAYS Performed at [facility], [address], [place], [place] [address]    Report Status 01/31/2018  FINAL  Final  Urine culture     Status: None   Collection Time: 01/27/18  6:35 PM  Result Value Ref Range Status   Specimen Description   Final    URINE, CLEAN CATCH Performed at [facility], [address], [place], [place] [address]    Special Requests   Final    Normal Performed at [facility] [address] [place], [place] [address]    Culture   Final    NO GROWTH Performed at [facility], [place] [address], [place], [place] [address]    Report Status 01/28/2018 FINAL  Final  Culture, blood (Routine X 2) w Reflex to ID Panel     Status: None (Preliminary result)   Collection Time: 01/31/18  7:05 PM  Result Value Ref Range Status   Specimen Description   Final    BLOOD RIGHT HAND BOTTLES DRAWN AEROBIC ONLY Blood Culture adequate volume   Special Requests   Final    NONE Performed at [facility], [address], [place], [place] [address]    Culture PENDING  Incomplete   Report Status PENDING  Incomplete  Culture, blood (Routine X 2) w Reflex to ID Panel     Status: None (Preliminary result)   Collection Time: 01/31/18  7:10 PM  Result Value Ref Range Status   Specimen Description   Final    BLOOD LEFT HAND Blood Culture adequate volume BOTTLES DRAWN AEROBIC ONLY   Special Requests   Final    NONE Performed at [facility], [address], [place], [place] [address]    Culture PENDING  Incomplete   Report Status PENDING  Incomplete    Medical History: Past Medical History:  Diagnosis Date  . Anxiety   . Arthritis    knees , back , shoulders (10/12/2017)  . Atrial fibrillation ([place])   . CAD (coronary artery disease)    Mild nonobstructive disease at cardiac catheterization 2002  . Chronic back pain    "back of my neck; down thru my legs" (10/12/2017)  . COPD (chronic obstructive pulmonary disease) ([place])   . Diverticulitis   . Esophageal reflux   . ESRD (end stage renal disease) on dialysis [facility])    "TTS; [place]" (11/23/2017)  . Essential hypertension   .  Hepatitis C    states he no longer has this  . History of kidney stones   .  History of syncope   . Hyperlipidemia   . Jerking 09/23/2014  . Myocardial infarction ([place]) 02/2017   "light one" (10/12/2017)  . Non-compliant behavior    non compliant with diaylsis per daughter  . PAT (paroxysmal atrial tachycardia) ([place])   . Peripheral arterial disease ([place])    Occluded left superficial femoral artery status post stent June 2016 - Dr. Trula Slade  . Pneumonia 1961  . Polycystic kidney, unspecified type   . Syncope 09/2014  . SYNCOPE 05/07/2010   Qualifier: Diagnosis of  By: Laurance Flatten, RN, BSN, [place]      Medications:  Vancomycin 1500 mg IV x one ED dose Zosyn 3.375 Gm IV x one ED dose  Assessment: 62 yo male s/p recent hospitalization for sepsis bought to the ED with altered mental status and acute respiratory failure. Vancomycin and Zosyn given for possible aspiration with dosing to be continued per pharmacy.   Estimated Creatinine Clearance: 20.2 ml/min (by C-G formula based on SCr of 4.61 mg/dl).   Goal of Therapy:  Vancomycin troughs 15-20 mcg/ml Eradicate infection  Plan:  Preliminary review of pertinent patient information completed.  Protocol will be initiated with one dose of Zosyn 3.375 Gm IV @8  hrs after the initial ED dose.  Forestine Na clinical pharmacist will complete review during morning rounds to assess patient and finalize treatment regimen if needed.  Norberto Sorenson, RPH 02/01/2018,4:41 AM

## 2018-02-01 NOTE — Progress Notes (Signed)
ANTICOAGULATION CONSULT NOTE - Follow Up Consult  Pharmacy Consult for heparin Indication: atrial fibrillation  Labs: Recent Labs    01/31/18 1511 01/31/18 1711 01/31/18 1742 01/31/18 2258 02/01/18 0513  HGB 10.3*  --  11.2*  --   --   HCT 33.0*  --  33.0*  --   --   PLT 340  --   --   --   --   APTT 32  --   --   --   --   LABPROT 16.7*  --   --   --   --   INR 1.37  --   --   --   --   HEPARINUNFRC  --   --   --   --  <0.10*  CREATININE 4.82*  --  5.30* 4.61* 4.64*  CKTOTAL  --  139  --   --   --   TROPONINI  --   --   --  0.03* 0.04*    Assessment: 62yo male undetectable on heparin with initial dosing for Afib; no gtt issues or signs of bleeding per RN.  Goal of Therapy:  Heparin level 0.3-0.7 units/ml   Plan:  Will rebolus with heparin 3000 units and increase heparin gtt by 4 units/kg/hr to 1350 units/hr and check level in 8 hours.    Wynona Neat, PharmD, BCPS  02/01/2018,6:57 AM

## 2018-02-01 NOTE — Progress Notes (Signed)
ANTICOAGULATION CONSULT NOTE - Follow up [place] for Heparin Indication: atrial fibrillation  No Known Allergies  Patient Measurements: Height: 6\' 4"  (193 cm) Weight: 187 lb 6.3 oz (85 kg) IBW/kg (Calculated) : 86.8 HEPARIN DW (KG): 85  Vital Signs: Temp: 97.4 F (36.3 C) (10/21 1127) Temp Source: Axillary (10/21 1127) BP: 101/84 (10/21 1245) Pulse Rate: 80 (10/21 0830)  Labs: Recent Labs    01/31/18 1511 01/31/18 1711 01/31/18 1742 01/31/18 2258 02/01/18 0513 02/01/18 1120 02/01/18 1517  HGB 10.3*  --  11.2*  --   --   --   --   HCT 33.0*  --  33.0*  --   --   --   --   PLT 340  --   --   --   --   --   --   APTT 32  --   --   --   --   --   --   LABPROT 16.7*  --   --   --   --   --   --   INR 1.37  --   --   --   --   --   --   HEPARINUNFRC  --   --   --   --  <0.10*  --  <0.10*  CREATININE 4.82*  --  5.30* 4.61* 4.64*  --   --   CKTOTAL  --  139  --   --   --   --   --   TROPONINI  --   --   --  0.03* 0.04* 0.03*  --     Estimated Creatinine Clearance: 20.1 mL/min (A) (by C-G formula based on SCr of 4.64 mg/dL (H)).   Medical History: Past Medical History:  Diagnosis Date  . Anxiety   . Arthritis    knees , back , shoulders (10/12/2017)  . Atrial fibrillation ([place])   . CAD (coronary artery disease)    Mild nonobstructive disease at cardiac catheterization 2002  . Chronic back pain    "back of my neck; down thru my legs" (10/12/2017)  . COPD (chronic obstructive pulmonary disease) ([place])   . Diverticulitis   . Esophageal reflux   . ESRD (end stage renal disease) on dialysis [facility])    "TTS; [place]" (11/23/2017)  . Essential hypertension   . Hepatitis C    states he no longer has this  . History of kidney stones   . History of syncope   . Hyperlipidemia   . Jerking 09/23/2014  . Myocardial infarction ([place]) 02/2017   "light one" (10/12/2017)  . Non-compliant behavior    non compliant with diaylsis per daughter  . PAT (paroxysmal atrial  tachycardia) ([place])   . Peripheral arterial disease ([place])    Occluded left superficial femoral artery status post stent June 2016 - Dr. Trula Slade  . Pneumonia 1961  . Polycystic kidney, unspecified type   . Syncope 09/2014  . SYNCOPE 05/07/2010   Qualifier: Diagnosis of  By: Laurance Flatten, RN, BSN, Anderson [place]      Medications:  See med rec  Assessment: 62 y.o. male, with history of anxiety, osteoarthritis, atrial fibrillation on chronic anticoagulation with Coumadin, CAD, chronic back pain, COPD, ESRD who was previously on hemodialysis, nephrology had stopped dialysis as patient had been noncompliant and started making urine as per note from previous hospitalization from 01/29/2018, dialysis catheter was removed.  Patient recently was admitted with sepsis due to paraspinal abscess status post drainage,  lumbar discitis.  He was discharged home on ciprofloxacin and Augmentin for 6 weeks of therapy.  At that time he also had a TEE which was negative for vegetation. Patient has been on Coumadin but still subtherapeutic, no dose today and unsure if he took doses the last few days. He is intubated now and Physician asked pharmacy to start heparin. Heparin level remains subtherapeutic.     Goal of Therapy:  Heparin level 0.3-0.7 units/ml Monitor platelets by anticoagulation protocol: Yes   Plan:  Heparin bolus 3000 units and increase heparin infusion at 1750 units/hr Check anti-Xa level in 6-8 hours and daily while on heparin Continue to monitor H&H and platelets  Isac Sarna, BS [place], BCPS Clinical Pharmacist Pager [phone] 02/01/2018,4:06 PM

## 2018-02-01 NOTE — Progress Notes (Signed)
Pharmacy Antibiotic Note  Aaron [name] is a 62 y.o. male admitted on 01/31/2018 with HCAP pneumonia.  Pharmacy has been consulted for Vancomycin and zosyn dosing. Elevated Scr, not being dialyzed. Patient is intubated.   Plan:  Vancomycin 1500mg  loading dose, then 1000mg  IV every 48 hours.  Goal trough 15-20 mcg/mL. Zosyn 3.375gm IV q12 hours EID over 4 hours F/U cxs and clinical progress Monitor V/S, labs and levels as indicate  Height: 6\' 4"  (193 cm) Weight: 187 lb 6.3 oz (85 kg) IBW/kg (Calculated) : 86.8  Temp (24hrs), Avg:97.1 F (36.2 C), Min:95.4 F (35.2 C), Max:99.9 F (37.7 C)  Recent Labs  Lab 01/26/18 2247 01/27/18 0415 01/28/18 0642 01/29/18 0533 01/31/18 1511 01/31/18 1742 01/31/18 2258 02/01/18 0513  WBC 10.6* 8.5 8.8 7.6 17.1*  --   --   --   CREATININE 4.51*  --  4.85* 4.96* 4.82* 5.30* 4.61* 4.64*  LATICACIDVEN  --   --   --   --   --  2.17* 1.1  --   VANCORANDOM  --   --   --  18  --   --   --   --     Estimated Creatinine Clearance: 20.1 mL/min (A) (by C-G formula based on SCr of 4.64 mg/dL (H)).   Normalized Cr Cl 60mls/min  No Known Allergies  Antimicrobials this admission: Vancomycin 10/20 >>   Zosyn 10/20>>   Dose adjustments this admission: N/A  Microbiology results: 10/20 BCx: pending 10/20 UCx: pending  Thank you for allowing pharmacy to be a part of this patient's care.  Isac Sarna, BS Pharm D, [place] Clinical Pharmacist Pager [phone] 02/01/2018 11:06 AM

## 2018-02-02 ENCOUNTER — Inpatient Hospital Stay (HOSPITAL_COMMUNITY): Payer: 59

## 2018-02-02 LAB — RENAL FUNCTION PANEL
ALBUMIN: 2.3 g/dL — AB (ref 3.5–5.0)
Anion gap: 12 (ref 5–15)
BUN: 46 mg/dL — AB (ref 8–23)
CHLORIDE: 113 mmol/L — AB (ref 98–111)
CO2: 14 mmol/L — AB (ref 22–32)
Calcium: 8.3 mg/dL — ABNORMAL LOW (ref 8.9–10.3)
Creatinine, Ser: 4.53 mg/dL — ABNORMAL HIGH (ref 0.61–1.24)
GFR calc Af Amer: 15 mL/min — ABNORMAL LOW (ref >60)
GFR, EST NON AFRICAN AMERICAN: 13 mL/min — AB (ref >60)
GLUCOSE: 85 mg/dL (ref 70–99)
PHOSPHORUS: 3.8 mg/dL (ref 2.5–4.6)
POTASSIUM: 3.6 mmol/L (ref 3.5–5.1)
Sodium: 139 mmol/L (ref 135–145)

## 2018-02-02 LAB — HEPARIN LEVEL (UNFRACTIONATED): Heparin Unfractionated: <0.1 IU/mL — ABNORMAL LOW (ref 0.30–0.70)

## 2018-02-02 LAB — IRON AND TIBC
IRON: 18 ug/dL — AB (ref 45–182)
Saturation Ratios: 12 % — ABNORMAL LOW (ref 17.9–39.5)
TIBC: 148 ug/dL — ABNORMAL LOW (ref 250–450)
UIBC: 130 ug/dL

## 2018-02-02 LAB — CBC
HEMATOCRIT: 25 % — AB (ref 39.0–52.0)
HEMOGLOBIN: 7.9 g/dL — AB (ref 13.0–17.0)
MCH: 29.7 pg (ref 26.0–34.0)
MCHC: 31.6 g/dL (ref 30.0–36.0)
MCV: 94 fL (ref 80.0–100.0)
NRBC: 0 % (ref 0.0–0.2)
Platelets: 259 10*3/uL (ref 150–400)
RBC: 2.66 MIL/uL — AB (ref 4.22–5.81)
RDW: 18.6 % — AB (ref 11.5–15.5)
WBC: 6.1 10*3/uL (ref 4.0–10.5)

## 2018-02-02 LAB — GLUCOSE, CAPILLARY
Glucose-Capillary: 84 mg/dL (ref 70–99)
Glucose-Capillary: 85 mg/dL (ref 70–99)
Glucose-Capillary: 91 mg/dL (ref 70–99)

## 2018-02-02 LAB — FERRITIN: FERRITIN: 182 ng/mL (ref 24–336)

## 2018-02-02 LAB — URINE CULTURE: Culture: NO GROWTH

## 2018-02-02 MED ORDER — FAMOTIDINE 40 MG/5ML PO SUSR
20.0000 mg | Freq: Every day | ORAL | Status: DC
  Filled 2018-02-02 (×2): qty 2.5

## 2018-02-02 MED ORDER — HEPARIN BOLUS VIA INFUSION
3000.0000 [IU] | Freq: Once | INTRAVENOUS | Status: AC
  Administered 2018-02-02: 3000 [IU] via INTRAVENOUS
  Filled 2018-02-02: qty 3000

## 2018-02-02 MED ORDER — HYDROCODONE-ACETAMINOPHEN 5-325 MG PO TABS
1.0000 | ORAL_TABLET | Freq: Four times a day (QID) | ORAL | Status: DC | PRN
  Administered 2018-02-02 – 2018-02-03 (×2): 2 via ORAL
  Administered 2018-02-08 (×2): 1 via ORAL
  Filled 2018-02-02 (×2): qty 2
  Filled 2018-02-02: qty 1
  Filled 2018-02-02: qty 2
  Filled 2018-02-02: qty 1

## 2018-02-02 MED ORDER — METOPROLOL TARTRATE 5 MG/5ML IV SOLN
5.0000 mg | Freq: Three times a day (TID) | INTRAVENOUS | Status: DC
  Administered 2018-02-02 – 2018-02-03 (×3): 5 mg via INTRAVENOUS
  Filled 2018-02-02 (×3): qty 5

## 2018-02-02 MED ORDER — HALOPERIDOL LACTATE 5 MG/ML IJ SOLN
2.5000 mg | Freq: Once | INTRAMUSCULAR | Status: AC
  Administered 2018-02-02: 2.5 mg via INTRAVENOUS
  Filled 2018-02-02: qty 1

## 2018-02-02 MED ORDER — ENOXAPARIN SODIUM 80 MG/0.8ML ~~LOC~~ SOLN
1.0000 mg/kg | SUBCUTANEOUS | Status: DC
  Administered 2018-02-02 – 2018-02-07 (×6): 80 mg via SUBCUTANEOUS
  Filled 2018-02-02 (×6): qty 0.8

## 2018-02-02 MED ORDER — STERILE WATER FOR INJECTION IV SOLN
INTRAVENOUS | Status: DC
  Administered 2018-02-02 – 2018-02-04 (×6): via INTRAVENOUS
  Filled 2018-02-02 (×10): qty 9.71

## 2018-02-02 MED ORDER — HYDRALAZINE HCL 20 MG/ML IJ SOLN
10.0000 mg | Freq: Four times a day (QID) | INTRAMUSCULAR | Status: DC | PRN
  Administered 2018-02-02 – 2018-02-07 (×5): 10 mg via INTRAVENOUS
  Filled 2018-02-02 (×5): qty 1

## 2018-02-02 MED ORDER — PANTOPRAZOLE SODIUM 40 MG PO TBEC
40.0000 mg | DELAYED_RELEASE_TABLET | Freq: Every day | ORAL | Status: DC
  Administered 2018-02-02 – 2018-02-08 (×7): 40 mg via ORAL
  Filled 2018-02-02 (×7): qty 1

## 2018-02-02 MED ORDER — RANITIDINE HCL 150 MG/10ML PO SYRP
150.0000 mg | ORAL_SOLUTION | Freq: Every day | ORAL | Status: DC
  Filled 2018-02-02 (×3): qty 10

## 2018-02-02 NOTE — Progress Notes (Signed)
PROGRESS NOTE    Aaron [name]  HUT:[id number] DOB: May 13, 1955 DOA: 01/31/2018 PCP: Gwenlyn Saran [place]     Brief Narrative:  62 y.o. male, with history of anxiety, osteoarthritis, atrial fibrillation on chronic anticoagulation with Coumadin, CAD, chronic back pain, COPD, ESRD who was previously on hemodialysis, nephrology had stopped dialysis as patient had been noncompliant and started making urine as per note from previous hospitalization from 01/29/2018, dialysis catheter was removed.  Patient recently was admitted with sepsis due to paraspinal abscess status post drainage, lumbar discitis.  He was discharged home on ciprofloxacin and Augmentin for 6 weeks of therapy.  At that time he also had a TEE which was negative for vegetation.  The patient was brought to the ED as he was found by family unconscious on the floor covered in his own stool and urine.  He was having inadequate respiration, and was oxygenating in 80% range. Intubated for airway protection.   Assessment & Plan:  1-acute resp failure with hypoxia: -with concerns for aspiration PNA -patient intubated for airway protection -continue ventilatory support; with plan to extubate later today -continue current antibiotics -continue nebs -follow response  2-acute encephalopathy:  -with concerns for non intended opiate overdose. -Alert, awake and able to follow commands -Will continue cutting off sedation and hopefully extubating him later today. -calm and non-agitated currently.  3-recent discitis and lumbar abscess -was on oral antibiotics since most recent discharge with intention to complete 6 weeks of tx. -continue iv antibiotics for now -Patient has remained afebrile and after fluid resuscitation given WBCs are back to normal.  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 -for now continue IVF's and lasix as dictated by nephrology -Creatinine improving -Add sodium bicarb as recommended by nephrology service and follow renal function trend.  5-Atrial fibrillation -rate is elevated today -Start patient on IV Lopressor -Continue heparin for now -His INR was subtherapeutic; when able to resume p.o. medications will restart warfarin per pharmacy protocol.  6-elevated troponin -has had chronic elevation, most likely from renal dysfunction -not a good candidate for cath anyway -will continue to monitor on telemetry   DVT prophylaxis: on heparin drip. Code Status: Full Family Communication: no family at bedside. Disposition Plan: remains inpatient, continue ventilatory support, IV antibiotics, lasix and IVF as per renal recommendations.  Continue SBP and extubate later today.    Consultants:   Nephrology   Pulmonary service.  Procedures:   See below for x-ray reports.   Antimicrobials:  Anti-infectives (From admission, onward)   Start     Dose/Rate Route Frequency Ordered Stop   02/02/18 2200  vancomycin (VANCOCIN) IVPB 1000 mg/200 mL premix     1,000 mg 200 mL/hr over 60 Minutes Intravenous Every 48 hours 02/01/18 1133     02/01/18 2000  piperacillin-tazobactam (ZOSYN) IVPB 3.375 g     3.375 g 12.5 mL/hr over 240 Minutes Intravenous Every 12 hours 02/01/18 1133     02/01/18 0445  piperacillin-tazobactam (ZOSYN) IVPB 3.375 g     3.375 g 12.5 mL/hr over 240 Minutes Intravenous  Once 02/01/18 0432 02/01/18 1013   01/31/18 2130  vancomycin (VANCOCIN) 1,500 mg in sodium chloride 0.9 % 500 mL IVPB     1,500 mg 250 mL/hr over 120 Minutes Intravenous  Once 01/31/18 2108 02/01/18 0046   01/31/18 2045  vancomycin (VANCOCIN) IVPB 1000 mg/200 mL premix  Status:  Discontinued     1,000 mg 200 mL/hr over 60 Minutes Intravenous  Once 01/31/18 2043 01/31/18 2108   01/31/18 2045  piperacillin-tazobactam (ZOSYN) IVPB 3.375 g     3.375 g 100 mL/hr  over 30 Minutes Intravenous  Once 01/31/18 2043 01/31/18 2150      Subjective: Intubated and mechanically ventilated.  No signs of agitation tolerating well SBP and with anticipated extubation later on.  Patient is afebrile.  Objective: Vitals:   02/02/18 0815 02/02/18 0830 02/02/18 0845 02/02/18 0900  BP: 126/87 (!) 127/98 (!) 142/93 129/89  Pulse: 91 87 96 92  Resp: (!) 21 (!) '21 12 14  ' Temp:      TempSrc:      SpO2: 100% 100% 100% 96%  Weight:      Height:        Intake/Output Summary (Last 24 hours) at 02/02/2018 1130 Last data filed at 02/02/2018 1105 Gross per 24 hour  Intake 4341.12 ml  Output 1250 ml  Net 3091.12 ml   Filed Weights   01/31/18 1724 02/02/18 0400  Weight: 85 kg 81.9 kg    Examination: General exam: Alert, awake, oriented and able to follow commands.  Patient is currently afebrile, no agitations.  Still intubated and mechanically ventilated; but passing SBP and with anticipated extubation later today. Respiratory system: Decreased breath sounds at the bases, no wheezing, good air movement bilaterally.  Positive bilateral rhonchi. Cardiovascular system: Tachycardic, no rubs, no gallops, no murmurs, no JVD. Gastrointestinal system: Abdomen is nondistended, soft and nontender. No organomegaly or masses felt. Normal bowel sounds heard. Central nervous system: Alert and oriented. No focal neurological deficits.  Following commands. Extremities: No cyanosis or clubbing; no edema. Skin: No petechiae, no ulcers; patient with bruises and abrasions on the left side of his face and having a skin tear on his left shoulder. Psychiatry: Mood appears stable.  Data Reviewed: I have personally reviewed following labs and imaging studies  CBC: Recent Labs  Lab 01/26/18 2247 01/27/18 0415 01/28/18 0642 01/29/18 0533 01/31/18 1511 01/31/18 1742 02/02/18 0457  WBC 10.6* 8.5 8.8 7.6 17.1*  --  6.1  NEUTROABS 9.0* 6.6 6.7  --  15.5*  --   --   HGB 9.9* 9.0* 9.1*  8.4* 10.3* 11.2* 7.9*  HCT 32.4* 28.6* 29.5* 28.4* 33.0* 33.0* 25.0*  MCV 98.5 96.3 96.1 98.3 96.5  --  94.0  PLT 262 245 261 244 340  --  286   Basic Metabolic Panel: Recent Labs  Lab 01/28/18 0642 01/29/18 0533 01/31/18 1511 01/31/18 1742 01/31/18 2258 02/01/18 0513 02/02/18 0457  NA 134* 133* 134* 136 138 138 139  K 4.6 4.6 4.6 5.8* 4.4 5.0 3.6  CL 103 100 103 105 108 109 113*  CO2 22 22 21*  --  20* 18* 14*  GLUCOSE 113* 116* 137* 121* 114* 95 85  BUN 39* 43* 49* 69* 46* 48* 46*  CREATININE 4.85* 4.96* 4.82* 5.30* 4.61* 4.64* 4.53*  CALCIUM 8.6* 8.8* 9.1  --  8.4* 8.5* 8.3*  MG 1.8  --   --   --   --   --   --   PHOS 5.6*  5.5* 5.0*  --   --   --   --  3.8   GFR: Estimated Creatinine Clearance: 19.8 mL/min (A) (by C-G formula based on SCr of 4.53 mg/dL (H)).   Liver Function Tests: Recent Labs  Lab 01/26/18 2247 01/28/18 3817 01/29/18 0533 01/31/18 1511 02/01/18 7116  02/02/18 0457  AST 15  --   --  21 20  --   ALT 9  --   --  9 9  --   ALKPHOS 130*  --   --  116 84  --   BILITOT 0.6  --   --  0.8 0.8  --   PROT 7.9  --   --  7.6 6.2*  --   ALBUMIN 3.2* 2.4* 2.4* 3.1* 2.5* 2.3*    Recent Labs  Lab 01/26/18 2247  AMMONIA 13   Coagulation Profile: Recent Labs  Lab 01/27/18 0415 01/28/18 0642 01/29/18 0533 01/31/18 1511  INR 1.35 1.24 1.18 1.37   Cardiac Enzymes: Recent Labs  Lab 01/27/18 2055 01/31/18 1711 01/31/18 2258 02/01/18 0513 02/01/18 1120 02/01/18 1517  CKTOTAL  --  139  --   --   --   --   TROPONINI 0.07*  --  0.03* 0.04* 0.03* 0.03*   CBG: Recent Labs  Lab 02/01/18 1620 02/01/18 2004 02/01/18 2354 02/02/18 0405 02/02/18 0723  GLUCAP 84 72 77 84 85   Lipid Profile: Recent Labs    01/31/18 2258  TRIG 63   Urine analysis:    Component Value Date/Time   COLORURINE YELLOW 01/31/2018 1726   APPEARANCEUR CLEAR 01/31/2018 1726   LABSPEC 1.012 01/31/2018 1726   PHURINE 5.0 01/31/2018 1726   GLUCOSEU NEGATIVE 01/31/2018  1726   HGBUR SMALL (A) 01/31/2018 1726   BILIRUBINUR NEGATIVE 01/31/2018 1726   KETONESUR NEGATIVE 01/31/2018 1726   PROTEINUR 30 (A) 01/31/2018 1726   UROBILINOGEN 0.2 03/03/2010 1056   NITRITE NEGATIVE 01/31/2018 1726   LEUKOCYTESUR NEGATIVE 01/31/2018 1726    Recent Results (from the past 240 hour(s))  Culture, blood (routine x 2)     Status: None   Collection Time: 01/26/18 10:47 PM  Result Value Ref Range Status   Specimen Description BLOOD RIGHT ARM  Final   Special Requests   Final    BOTTLES DRAWN AEROBIC AND ANAEROBIC ANA BCAV AEB [place]   Culture   Final    NO GROWTH 5 DAYS Performed at [facility], [address], [place], [place] [address]    Report Status 01/31/2018 FINAL  Final  Culture, blood (routine x 2)     Status: None   Collection Time: 01/26/18 10:47 PM  Result Value Ref Range Status   Specimen Description BLOOD RIGHT ARM  Final   Special Requests   Final    BOTTLES DRAWN AEROBIC AND ANAEROBIC ANA BCAV AEB [place]   Culture   Final    NO GROWTH 5 DAYS Performed at [facility], [address], [place], [place] [address]    Report Status 01/31/2018 FINAL  Final  Urine culture     Status: None   Collection Time: 01/27/18  6:35 PM  Result Value Ref Range Status   Specimen Description   Final    URINE, CLEAN CATCH Performed at [facility], [address], [place], [place] [address]    Special Requests   Final    Normal Performed at [facility], [address] [place], [place] [address]    Culture   Final    NO GROWTH Performed at [facility], [place] [address] [place], [place] [address]    Report Status 01/28/2018 FINAL  Final  Urine Culture     Status: None   Collection Time: 01/31/18  5:26 PM  Result Value Ref Range Status   Specimen Description  Final    URINE, CATHETERIZED Performed at [facility], [address], [place], [place] [address]    Special Requests   Final    NONE Performed at [facility], [address], [place], [place]  [address]    Culture   Final    NO GROWTH Performed at [facility], [place] [address] [place], [place] [address]    Report Status 02/02/2018 FINAL  Final  Culture, blood (Routine X 2) w Reflex to ID Panel     Status: None (Preliminary result)   Collection Time: 01/31/18  7:05 PM  Result Value Ref Range Status   Specimen Description   Final    BLOOD RIGHT HAND BOTTLES DRAWN AEROBIC ONLY Blood Culture adequate volume   Special Requests NONE  Final   Culture   Final    NO GROWTH 2 DAYS Performed at [facility], [address], [place], [place] [address]    Report Status PENDING  Incomplete  Culture, blood (Routine X 2) w Reflex to ID Panel     Status: None (Preliminary result)   Collection Time: 01/31/18  7:10 PM  Result Value Ref Range Status   Specimen Description   Final    BLOOD LEFT HAND Blood Culture adequate volume BOTTLES DRAWN AEROBIC ONLY   Special Requests NONE  Final   Culture   Final    NO GROWTH 2 DAYS Performed at [facility], [address] [place], [place] [address]    Report Status PENDING  Incomplete     Radiology Studies: Ct Head Wo Contrast  Result Date: 01/31/2018 CLINICAL DATA:  Found unresponsive EXAM: CT HEAD WITHOUT CONTRAST CT CERVICAL SPINE WITHOUT CONTRAST TECHNIQUE: Multidetector CT imaging of the head and cervical spine was performed following the standard protocol without intravenous contrast. Multiplanar CT image reconstructions of the cervical spine were also generated. COMPARISON:  12/25/2017 FINDINGS: CT HEAD FINDINGS Brain: There is atrophy and chronic small vessel disease changes. No acute intracranial abnormality. Specifically, no hemorrhage, hydrocephalus, mass lesion, acute infarction, or significant intracranial injury. Vascular: No hyperdense vessel or unexpected calcification. Skull: No acute calvarial abnormality. Sinuses/Orbits: Diffuse mucosal thickening throughout the ethmoid air cells and right sphenoid sinus. Mastoid air cells clear.  No orbital soft tissue abnormality. Other: None CT CERVICAL SPINE FINDINGS Alignment: No subluxation Skull base and vertebrae: No acute fracture. No primary bone lesion or focal pathologic process. Soft tissues and spinal canal: No prevertebral fluid or swelling. No visible canal hematoma. Disc levels: Prior anterior fusion from C3-C5. Diffuse degenerative disc and facet disease. Upper chest: Emphysematous changes in the apices. No acute findings. Other: Carotid artery calcifications bilaterally. IMPRESSION: No acute intracranial abnormality. Atrophy, chronic microvascular disease. Prior anterior fusion from C3-C5. Diffuse degenerative disc and facet disease in the cervical spine. No acute bony abnormality. Electronically Signed   By: Rolm Baptise M.D.   On: 01/31/2018 19:42   Ct Cervical Spine Wo Contrast  Result Date: 01/31/2018 CLINICAL DATA:  Found unresponsive EXAM: CT HEAD WITHOUT CONTRAST CT CERVICAL SPINE WITHOUT CONTRAST TECHNIQUE: Multidetector CT imaging of the head and cervical spine was performed following the standard protocol without intravenous contrast. Multiplanar CT image reconstructions of the cervical spine were also generated. COMPARISON:  12/25/2017 FINDINGS: CT HEAD FINDINGS Brain: There is atrophy and chronic small vessel disease changes. No acute intracranial abnormality. Specifically, no hemorrhage, hydrocephalus, mass lesion, acute infarction, or significant intracranial injury. Vascular: No hyperdense vessel or unexpected calcification. Skull: No acute calvarial abnormality. Sinuses/Orbits: Diffuse mucosal  thickening throughout the ethmoid air cells and right sphenoid sinus. Mastoid air cells clear. No orbital soft tissue abnormality. Other: None CT CERVICAL SPINE FINDINGS Alignment: No subluxation Skull base and vertebrae: No acute fracture. No primary bone lesion or focal pathologic process. Soft tissues and spinal canal: No prevertebral fluid or swelling. No visible canal  hematoma. Disc levels: Prior anterior fusion from C3-C5. Diffuse degenerative disc and facet disease. Upper chest: Emphysematous changes in the apices. No acute findings. Other: Carotid artery calcifications bilaterally. IMPRESSION: No acute intracranial abnormality. Atrophy, chronic microvascular disease. Prior anterior fusion from C3-C5. Diffuse degenerative disc and facet disease in the cervical spine. No acute bony abnormality. Electronically Signed   By: Rolm Baptise M.D.   On: 01/31/2018 19:42   Dg Chest Port 1 View  Result Date: 02/02/2018 CLINICAL DATA:  Bilateral inspiratory rales EXAM: PORTABLE CHEST 1 VIEW COMPARISON:  01/31/2018 FINDINGS: Endotracheal tube with the tip 7 cm above the carina. Nasogastric tube coursing below the diaphragm. Mild bilateral interstitial thickening. Possible trace left pleural effusion. No pneumothorax. Increased opacity in the left retrocardiac region likely related to technique. Stable cardiomediastinal silhouette. No acute osseous abnormality. IMPRESSION: 1. Endotracheal tube with the tip 7 cm above the carina. 2. Nasogastric tube coursing below the diaphragm. 3. Bilateral diffuse mild interstitial thickening which may reflect mild interstitial edema versus infection. Electronically Signed   By: Kathreen Devoid   On: 02/02/2018 10:19   Dg Chest Port 1 View  Result Date: 01/31/2018 CLINICAL DATA:  Intubated EXAM: PORTABLE CHEST 1 VIEW COMPARISON:  Chest radiograph from earlier today. FINDINGS: Endotracheal tube tip is 14.0 cm above the carina. Enteric tube coils in cervical esophagus and enters the stomach with the tip not seen on this image. Stable cardiomediastinal silhouette with normal heart size. No pneumothorax. No pleural effusion. No pulmonary edema. Mild left basilar scarring versus atelectasis. No acute consolidative airspace disease. IMPRESSION: 1. Endotracheal tube tip 14.0 cm above the carina, recommend advancing. 2. Enteric tube coils in the cervical  esophagus and enters the stomach with the tip not seen on this image. 3. Mild left basilar scarring versus atelectasis. These results were called by telephone at the time of interpretation on 01/31/2018 at 6:30 pm to Dr. Noemi Chapel , who verbally acknowledged these results. Electronically Signed   By: Ilona Sorrel M.D.   On: 01/31/2018 18:32   Dg Chest Port 1 View  Result Date: 01/31/2018 CLINICAL DATA:  Unconscious patient. EXAM: PORTABLE CHEST 1 VIEW COMPARISON:  01/26/2018 FINDINGS: Lower portions of the thorax are excluded by collimation. Interval removal of dialysis catheter. Mildly enlarged cardiac silhouette. Mediastinal contours appear intact. There is no evidence of focal airspace consolidation, pleural effusion or pneumothorax. Osseous structures are without acute abnormality. Soft tissues are grossly normal. IMPRESSION: Mildly enlarged cardiac silhouette. Otherwise no acute abnormalities, accounting for excluding of the lower thorax. Electronically Signed   By: Fidela Salisbury M.D.   On: 01/31/2018 17:49    Scheduled Meds: . chlorhexidine gluconate (MEDLINE KIT)  15 mL Mouth Rinse BID  . furosemide  80 mg Intravenous BID  . mouth rinse  15 mL Mouth Rinse 10 times per day  . metoprolol tartrate  5 mg Intravenous Q8H  . ranitidine  150 mg Oral Daily   Continuous Infusions: . heparin 2,300 Units/hr (02/02/18 1105)  . piperacillin-tazobactam (ZOSYN)  IV 12.5 mL/hr at 02/02/18 1105  . propofol (DIPRIVAN) infusion Stopped (02/02/18 1016)  .  sodium bicarbonate infusion 1/4 NS 1000 mL 125  mL/hr at 02/02/18 1105  . vancomycin       LOS: 2 days    Time spent: 35 minutes.  Greater than 50% of this time was spent in direct contact with the patient, coordinating care with staff and discussing relevant ongoing clinical issues.  At this moment anticipated extubation, continue IV antibiotics, follow renal function recommendations regarding fluids and diuretics.  Patient with metabolic  acidosis most likely from his chronic renal failure.  Patient is afebrile and on his SBP able to follow commands appropriately.  Will discontinue Neck Collar.  Barton Dubois, MD [facility] Pager [phone]  If 7PM-7AM, please contact night-coverage [email or web address] Password [email or web address] 02/02/2018, 11:30 AM

## 2018-02-02 NOTE — Procedures (Signed)
**Note De-Identified Gael Delude Obfuscation** Extubation Procedure Note  Patient Details:   Name: Aaron [name] DOB: [birth year] MRN: [id number]   Airway Documentation:  Airway 7.5 mm (Active)  Secured at (cm) 23 cm 01/31/2018  8:40 PM  Measured From Lips 01/31/2018  8:40 PM  Secured Location Right 01/31/2018  8:40 PM  Secured By [employer] 01/31/2018  8:40 PM  Tube Holder Repositioned Yes 01/31/2018  6:36 PM  Site Condition Dry 01/31/2018  6:36 PM     Airway 7.5 mm (Active)  Secured at (cm) 25 cm 02/02/2018  9:17 AM  Measured From Lips 02/02/2018  9:17 AM  [place] 02/02/2018  9:17 AM  Secured By [employer] 02/02/2018  9:17 AM  Tube Holder Repositioned Yes 02/02/2018  9:17 AM  Cuff Pressure (cm H2O) 24 cm H2O 02/01/2018  7:47 PM  Site Condition Dry 02/02/2018  9:17 AM   Vent end date: (not recorded) Vent end time: (not recorded)   Evaluation  O2 sats: stable throughout Complications: No apparent complications Patient did tolerate procedure well. Bilateral Breath Sounds: Diminished   Yes + leal, VS and parameters WNL Micky Sheller, Penni Bombard 02/02/2018, 11:05 AM

## 2018-02-02 NOTE — Progress Notes (Signed)
Patient extubated at around 72JL without complications. Patient is currently alert and oriented x2 (to self and place). Patient ate about 50% of dinner tray, writer fed patient. Patient moving hands, legs, feet and arms slightly. Dr. Dyann Kief made aware of patient's blood pressures being elevated and heart rate elevated. Meds ordered, given and will continue to monitor. Patient denies any pain, shortness of breath, chest pain, dizziness, nausea or vomiting.

## 2018-02-02 NOTE — Progress Notes (Signed)
ANTICOAGULATION CONSULT NOTE - Follow up Aaron [name] for Heparin >> Lovenox Indication: atrial fibrillation  No Known Allergies  Patient Measurements: Height: 6\' 4"  (193 cm) Weight: 180 lb 8.9 oz (81.9 kg) IBW/kg (Calculated) : 86.8 HEPARIN DW (KG): 85  Vital Signs: Temp: 98.6 F (37 C) (10/22 2000) Temp Source: Oral (10/22 2000) BP: 165/137 (10/22 2000) Pulse Rate: 129 (10/22 2000)  Labs: Recent Labs    01/31/18 1511 01/31/18 1711 01/31/18 1742  01/31/18 2258  02/01/18 0513 02/01/18 1120 02/01/18 1517  02/02/18 0457 02/02/18 1535 02/02/18 1904  HGB 10.3*  --  11.2*  --   --   --   --   --   --   --  7.9*  --   --   HCT 33.0*  --  33.0*  --   --   --   --   --   --   --  25.0*  --   --   PLT 340  --   --   --   --   --   --   --   --   --  259  --   --   APTT 32  --   --   --   --   --   --   --   --   --   --   --   --   LABPROT 16.7*  --   --   --   --   --   --   --   --   --   --   --   --   INR 1.37  --   --   --   --   --   --   --   --   --   --   --   --   HEPARINUNFRC  --   --   --   --   --    < > <0.10*  --  <0.10*   < > <0.10* <0.10* <0.10*  CREATININE 4.82*  --  5.30*  --  4.61*  --  4.64*  --   --   --  4.53*  --   --   CKTOTAL  --  139  --   --   --   --   --   --   --   --   --   --   --   TROPONINI  --   --   --    < > 0.03*  --  0.04* 0.03* 0.03*  --   --   --   --    < > = values in this interval not displayed.    Estimated Creatinine Clearance: 19.8 mL/min (A) (by C-G formula based on SCr of 4.53 mg/dL (H)).  Assessment: 62 y.o. male, with history of anxiety, osteoarthritis, atrial fibrillation on chronic anticoagulation with Coumadin, CAD, chronic back pain, COPD, ESRD who was previously on hemodialysis, nephrology had stopped dialysis as patient had been noncompliant and started making urine as per note from previous hospitalization from 01/29/2018, dialysis catheter was removed.  Patient recently was admitted with sepsis due to  paraspinal abscess status post drainage, lumbar discitis.  He was discharged home on ciprofloxacin and Augmentin for 6 weeks of therapy.  At that time he also had a TEE which was negative for vegetation. Patient has been on Coumadin but still subtherapeutic, no dose today and unsure  if he took doses the last few days. He is intubated now and Physician asked pharmacy to start heparin. Heparin level remains subtherapeutic.  Discussed with MD, Heparin level remains low despite multiple increases.  Will transition to SQ Lovenox.     Goal of Therapy:  Systemic Anticoagulation.  Monitor platelets by anticoagulation protocol: Yes   Plan:  Lovenox 1mg /kg SW every 24 hours (adjusted to renal function) Continue to monitor H&H and platelets  Thanks for allowing pharmacy to be a part of this patient's care.  Pricilla Larsson, [facility] 02/02/2018 9:08 PM

## 2018-02-02 NOTE — Progress Notes (Signed)
Subjective: Interval History: none.  Patient intubated  Objective: Vital signs in last 24 hours: Temp:  [95.5 F (35.3 C)-98.5 F (36.9 C)] 98.3 F (36.8 C) (10/22 0724) Pulse Rate:  [66-95] 78 (10/22 0724) Resp:  [18-43] 19 (10/22 0724) BP: (89-176)/(64-158) 129/88 (10/22 0700) SpO2:  [87 %-100 %] 100 % (10/22 0724) FiO2 (%):  [35 %-40 %] 35 % (10/22 0305) Weight:  [81.9 kg] 81.9 kg (10/22 0400) Weight change: -3.1 kg  Intake/Output from previous day: 10/21 0701 - 10/22 0700 In: 4138.4 [I.V.:4035.2; IV Piggyback:103.2] Out: 1250 [Urine:1250] Intake/Output this shift: No intake/output data recorded.  Patient remain intubated Chest: He has bilateral inspiratory rales and expiratory crackles. Heart exam revealed iregular rate and rhythm no murmur Abdomen: Soft positive bowel sounds Extremities no edema  Lab Results: Recent Labs    01/31/18 1511 01/31/18 1742 02/02/18 0457  WBC 17.1*  --  6.1  HGB 10.3* 11.2* 7.9*  HCT 33.0* 33.0* 25.0*  PLT 340  --  259   BMET:  Recent Labs    02/01/18 0513 02/02/18 0457  NA 138 139  K 5.0 3.6  CL 109 113*  CO2 18* 14*  GLUCOSE 95 85  BUN 48* 46*  CREATININE 4.64* 4.53*  CALCIUM 8.5* 8.3*   No results for input([name]): PTH in the last 72 hours. Iron Studies: No results for input([name]): IRON, TIBC, TRANSFERRIN, FERRITIN in the last 72 hours.  Studies/Results: Ct Head Wo Contrast  Result Date: 01/31/2018 CLINICAL DATA:  Found unresponsive EXAM: CT HEAD WITHOUT CONTRAST CT CERVICAL SPINE WITHOUT CONTRAST TECHNIQUE: Multidetector CT imaging of the head and cervical spine was performed following the standard protocol without intravenous contrast. Multiplanar CT image reconstructions of the cervical spine were also generated. COMPARISON:  12/25/2017 FINDINGS: CT HEAD FINDINGS Brain: There is atrophy and chronic small vessel disease changes. No acute intracranial abnormality. Specifically, no hemorrhage, hydrocephalus, mass lesion, acute  infarction, or significant intracranial injury. Vascular: No hyperdense vessel or unexpected calcification. Skull: No acute calvarial abnormality. Sinuses/Orbits: Diffuse mucosal thickening throughout the ethmoid air cells and right sphenoid sinus. Mastoid air cells clear. No orbital soft tissue abnormality. Other: None CT CERVICAL SPINE FINDINGS Alignment: No subluxation Skull base and vertebrae: No acute fracture. No primary bone lesion or focal pathologic process. Soft tissues and spinal canal: No prevertebral fluid or swelling. No visible canal hematoma. Disc levels: Prior anterior fusion from C3-C5. Diffuse degenerative disc and facet disease. Upper chest: Emphysematous changes in the apices. No acute findings. Other: Carotid artery calcifications bilaterally. IMPRESSION: No acute intracranial abnormality. Atrophy, chronic microvascular disease. Prior anterior fusion from C3-C5. Diffuse degenerative disc and facet disease in the cervical spine. No acute bony abnormality. Electronically Signed   By: Rolm Baptise M.D.   On: 01/31/2018 19:42   Ct Cervical Spine Wo Contrast  Result Date: 01/31/2018 CLINICAL DATA:  Found unresponsive EXAM: CT HEAD WITHOUT CONTRAST CT CERVICAL SPINE WITHOUT CONTRAST TECHNIQUE: Multidetector CT imaging of the head and cervical spine was performed following the standard protocol without intravenous contrast. Multiplanar CT image reconstructions of the cervical spine were also generated. COMPARISON:  12/25/2017 FINDINGS: CT HEAD FINDINGS Brain: There is atrophy and chronic small vessel disease changes. No acute intracranial abnormality. Specifically, no hemorrhage, hydrocephalus, mass lesion, acute infarction, or significant intracranial injury. Vascular: No hyperdense vessel or unexpected calcification. Skull: No acute calvarial abnormality. Sinuses/Orbits: Diffuse mucosal thickening throughout the ethmoid air cells and right sphenoid sinus. Mastoid air cells clear. No orbital  soft tissue abnormality. Other:  None CT CERVICAL SPINE FINDINGS Alignment: No subluxation Skull base and vertebrae: No acute fracture. No primary bone lesion or focal pathologic process. Soft tissues and spinal canal: No prevertebral fluid or swelling. No visible canal hematoma. Disc levels: Prior anterior fusion from C3-C5. Diffuse degenerative disc and facet disease. Upper chest: Emphysematous changes in the apices. No acute findings. Other: Carotid artery calcifications bilaterally. IMPRESSION: No acute intracranial abnormality. Atrophy, chronic microvascular disease. Prior anterior fusion from C3-C5. Diffuse degenerative disc and facet disease in the cervical spine. No acute bony abnormality. Electronically Signed   By: Rolm Baptise M.D.   On: 01/31/2018 19:42   Dg Chest Port 1 View  Result Date: 01/31/2018 CLINICAL DATA:  Intubated EXAM: PORTABLE CHEST 1 VIEW COMPARISON:  Chest radiograph from earlier today. FINDINGS: Endotracheal tube tip is 14.0 cm above the carina. Enteric tube coils in cervical esophagus and enters the stomach with the tip not seen on this image. Stable cardiomediastinal silhouette with normal heart size. No pneumothorax. No pleural effusion. No pulmonary edema. Mild left basilar scarring versus atelectasis. No acute consolidative airspace disease. IMPRESSION: 1. Endotracheal tube tip 14.0 cm above the carina, recommend advancing. 2. Enteric tube coils in the cervical esophagus and enters the stomach with the tip not seen on this image. 3. Mild left basilar scarring versus atelectasis. These results were called by telephone at the time of interpretation on 01/31/2018 at 6:30 pm to Dr. Noemi Chapel , who verbally acknowledged these results. Electronically Signed   By: Ilona Sorrel M.D.   On: 01/31/2018 18:32   Dg Chest Port 1 View  Result Date: 01/31/2018 CLINICAL DATA:  Unconscious patient. EXAM: PORTABLE CHEST 1 VIEW COMPARISON:  01/26/2018 FINDINGS: Lower portions of the thorax  are excluded by collimation. Interval removal of dialysis catheter. Mildly enlarged cardiac silhouette. Mediastinal contours appear intact. There is no evidence of focal airspace consolidation, pleural effusion or pneumothorax. Osseous structures are without acute abnormality. Soft tissues are grossly normal. IMPRESSION: Mildly enlarged cardiac silhouette. Otherwise no acute abnormalities, accounting for excluding of the lower thorax. Electronically Signed   By: Fidela Salisbury M.D.   On: 01/31/2018 17:49    I have reviewed the patient'[name] current medications.  Assessment/Plan: 1] acute on chronic renal failure: Presently his creatinine is stable.  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  Recently dialysis has been discontinued and was being followed as an outpatient. 2] altered mental status: Etiology not clear but possibly from medications. 3] respiratory failure: Presently patient is intubated.  He is on Lasix and he had 1200 cc of urine output. 4] bone and mineral disorder: Calcium and phosphorus is range 5] anemia: Possibly anemia of chronic renal failure however iron deficiency anemia: To be ruled out.  6] hypertension: His blood pressure is reasonably controlled 7] history of paroxysmal atrial tachycardia: His heart rate is controlled 8] low CO2: Possibly metabolic.  Presently seems to be worsening. Plan: 1] we will continue his present management 2] we will check his renal panel and CBC in the morning. 3] we will change his IV fluid to 1/4 normal saline with 100 mEq of sodium bicarbonate at 125 cc/h 4] we will check iron studies in the morning.   LOS: 2 days   Memory Aaron [name] 02/02/2018,7:59 AM

## 2018-02-03 ENCOUNTER — Inpatient Hospital Stay (HOSPITAL_COMMUNITY): Payer: 59

## 2018-02-03 ENCOUNTER — Encounter (HOSPITAL_COMMUNITY): Payer: Self-pay

## 2018-02-03 DIAGNOSIS — J441 Chronic obstructive pulmonary disease with (acute) exacerbation: Secondary | ICD-10-CM

## 2018-02-03 DIAGNOSIS — N185 Chronic kidney disease, stage 5: Secondary | ICD-10-CM

## 2018-02-03 DIAGNOSIS — G9340 Encephalopathy, unspecified: Secondary | ICD-10-CM

## 2018-02-03 DIAGNOSIS — I482 Chronic atrial fibrillation, unspecified: Secondary | ICD-10-CM

## 2018-02-03 DIAGNOSIS — J9601 Acute respiratory failure with hypoxia: Secondary | ICD-10-CM

## 2018-02-03 LAB — RENAL FUNCTION PANEL
ALBUMIN: 2.7 g/dL — AB (ref 3.5–5.0)
Anion gap: 9 (ref 5–15)
BUN: 44 mg/dL — AB (ref 8–23)
CO2: 21 mmol/L — ABNORMAL LOW (ref 22–32)
CREATININE: 4.68 mg/dL — AB (ref 0.61–1.24)
Calcium: 8.4 mg/dL — ABNORMAL LOW (ref 8.9–10.3)
Chloride: 109 mmol/L (ref 98–111)
GFR calc Af Amer: 14 mL/min — ABNORMAL LOW (ref >60)
GFR, EST NON AFRICAN AMERICAN: 12 mL/min — AB (ref >60)
Glucose, Bld: 91 mg/dL (ref 70–99)
PHOSPHORUS: 5.1 mg/dL — AB (ref 2.5–4.6)
POTASSIUM: 3.6 mmol/L (ref 3.5–5.1)
Sodium: 139 mmol/L (ref 135–145)

## 2018-02-03 LAB — CBC
HEMATOCRIT: 30.3 % — AB (ref 39.0–52.0)
Hemoglobin: 9.6 g/dL — ABNORMAL LOW (ref 13.0–17.0)
MCH: 30.6 pg (ref 26.0–34.0)
MCHC: 31.7 g/dL (ref 30.0–36.0)
MCV: 96.5 fL (ref 80.0–100.0)
NRBC: 0 % (ref 0.0–0.2)
PLATELETS: 339 10*3/uL (ref 150–400)
RBC: 3.14 MIL/uL — AB (ref 4.22–5.81)
RDW: 18.6 % — AB (ref 11.5–15.5)
WBC: 10.2 10*3/uL (ref 4.0–10.5)

## 2018-02-03 LAB — GLUCOSE, CAPILLARY: GLUCOSE-CAPILLARY: 98 mg/dL (ref 70–99)

## 2018-02-03 MED ORDER — DILTIAZEM HCL 30 MG PO TABS
30.0000 mg | ORAL_TABLET | Freq: Four times a day (QID) | ORAL | Status: DC
  Administered 2018-02-03 – 2018-02-04 (×4): 30 mg via ORAL
  Filled 2018-02-03 (×4): qty 1

## 2018-02-03 MED ORDER — BUDESONIDE 0.5 MG/2ML IN SUSP
0.5000 mg | Freq: Two times a day (BID) | RESPIRATORY_TRACT | Status: DC
  Administered 2018-02-03 – 2018-02-08 (×11): 0.5 mg via RESPIRATORY_TRACT
  Filled 2018-02-03 (×11): qty 2

## 2018-02-03 MED ORDER — METOPROLOL TARTRATE 5 MG/5ML IV SOLN
5.0000 mg | Freq: Four times a day (QID) | INTRAVENOUS | Status: DC
  Administered 2018-02-03 – 2018-02-04 (×4): 5 mg via INTRAVENOUS
  Filled 2018-02-03 (×4): qty 5

## 2018-02-03 MED ORDER — SODIUM CHLORIDE 0.9 % IV SOLN
INTRAVENOUS | Status: DC | PRN
  Administered 2018-02-03: 500 mL via INTRAVENOUS

## 2018-02-03 MED ORDER — IPRATROPIUM-ALBUTEROL 0.5-2.5 (3) MG/3ML IN SOLN
3.0000 mL | Freq: Four times a day (QID) | RESPIRATORY_TRACT | Status: DC
  Administered 2018-02-03 – 2018-02-06 (×11): 3 mL via RESPIRATORY_TRACT
  Filled 2018-02-03 (×11): qty 3

## 2018-02-03 NOTE — Progress Notes (Signed)
PROGRESS NOTE  Aaron [name] OZD:[id number] DOB: 05/11/1955 DOA: 01/31/2018 PCP: Gwenlyn Saran Laurens  Brief History:  62 year old with a history of anxiety, osteoarthritis, atrial fibrillation, CAD, chronic back pain, lumbar discitis and paraspinal abscess COPD, ESRD previously on hemodialysis, GERD, hepatitis C, essential hypertension, PAD, medical noncompliance came to the ER when he was found by his family unconscious on the floor covered in his own stool and urine.  The patient apparently had agonal respirations with oxygen saturation 80% range.  He was intubated for airway protection.  The patient was recently admitted to the hospital from 01/26/2018 through 01/29/2018 during which the patient refused dialysis and was subsequently taken off of dialysis by nephrology.  His dialysis catheter was removed at that time.  The patient has had a long history of noncompliance including leaving AMA from the hospitalization 01/09/2018 through 01/13/2018.  The patient was most recently discharged on Augmentin and ciprofloxacin for his paraspinal abscess.  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 and hypnotics -Patient stated that he took some of his sister's "tranquilizers" -Intubated in the emergency department -Extubated 25/95/6387  Acute metabolic and toxic encephalopathy -Likely secondary to opioid overuse -The [facility] Controlled Substance Reporting System has been queried for this patient--the patient received Percocet 10 mg #120 on 12/29/2017 and subsequently an additional 73 Percocet filled since that time -Patient remains encephalopathic, but follows basic commands -UDS--positive opiates  CKD stage V -Nephrology following -Patient has refused dialysis; he was poorly compliant not going to his outpatient dialysis center -Continue bicarbonate drip per nephrology--started  02/02/2018 -Left arm AV fistula ligated secondary to bleeding 12/2017 -Continue furosemide per nephrology  Lumbar discitis and paraspinal abscess -01/10/2018 drainage--bacillus species -Was previously on vancomycin and ceftazidime when he was on dialysis -Continue vancomycin and Zosyn for now  COPD Exacerbation -start duonebs -start pulmicort  Elevated troponin -Secondary to ESRD -Trend is flat -No chest pain presently -Personally reviewed EKG--atrial fibrillation, nonspecific T wave changes  Chronic atrial fibrillation with RVR -start dilitazem -01/12/2018 TEE--EF 50-55%, no vegetation, mild MR, HK anterior septal -INR was subtherapeutic at the time of admission  Essential hypertension -Previously on carvedilol prior to admission -Continue IV Lopressor for now     Disposition Plan:   Home in 2-3 days  Family Communication:   Family at bedside  Consultants:  nephrology  Code Status:  FULL   DVT Prophylaxis:  [place] Lovenox   Procedures: As Listed in Progress Note Above  Antibiotics: None    Subjective: Patient complains of some shortness of breath.  He has a nonproductive cough.  Denies any chest pain, nausea vomiting, diarrhea, abdominal pain.  Denies any headache or neck pain.  Complains of swelling in right arm  Objective: Vitals:   02/03/18 0700 02/03/18 0800 02/03/18 0900 02/03/18 1000  BP: (!) 144/85 (!) 161/110 (!) 148/91 (!) 164/106  Pulse: (!) 114 (!) 120 (!) 113 (!) 104  Resp: 18 14 13 12   Temp: 98 F (36.7 C)     TempSrc:      SpO2: 98% 100%  100%  Weight:      Height:        Intake/Output Summary (Last 24 hours) at 02/03/2018 1054 Last data filed at 02/03/2018 0900 Gross per 24 hour  Intake 3130.71 ml  Output 3200 ml  Net -69.29 ml   Weight change: 5.6 kg Exam:   General:  Pt is alert,  follows commands appropriately, not in acute distress; pleasantly confused  HEENT: No icterus, No thrush, No neck mass, [place]/AT  Cardiovascular: IRRR,  S1/S2, no rubs, no gallops  Respiratory: Bilateral scattered rales.  Bilateral expiratory wheeze.  Abdomen: Soft/+BS, non tender, non distended, no guarding  Extremities: 1 +LE edema, No lymphangitis, No petechiae, No rashes, no synovitis   Data Reviewed: I have personally reviewed following labs and imaging studies Basic Metabolic Panel: Recent Labs  Lab 01/28/18 0642 01/29/18 0533 01/31/18 1511 01/31/18 1742 01/31/18 2258 02/01/18 0513 02/02/18 0457 02/03/18 0355  NA 134* 133* 134* 136 138 138 139 139  K 4.6 4.6 4.6 5.8* 4.4 5.0 3.6 3.6  CL 103 100 103 105 108 109 113* 109  CO2 22 22 21*  --  20* 18* 14* 21*  GLUCOSE 113* 116* 137* 121* 114* 95 85 91  BUN 39* 43* 49* 69* 46* 48* 46* 44*  CREATININE 4.85* 4.96* 4.82* 5.30* 4.61* 4.64* 4.53* 4.68*  CALCIUM 8.6* 8.8* 9.1  --  8.4* 8.5* 8.3* 8.4*  MG 1.8  --   --   --   --   --   --   --   PHOS 5.6*  5.5* 5.0*  --   --   --   --  3.8 5.1*   Liver Function Tests: Recent Labs  Lab 01/29/18 0533 01/31/18 1511 02/01/18 0513 02/02/18 0457 02/03/18 0355  AST  --  21 20  --   --   ALT  --  9 9  --   --   ALKPHOS  --  116 84  --   --   BILITOT  --  0.8 0.8  --   --   PROT  --  7.6 6.2*  --   --   ALBUMIN 2.4* 3.1* 2.5* 2.3* 2.7*   No results for input(s): LIPASE, AMYLASE in the last 168 hours. No results for input(s): AMMONIA in the last 168 hours. Coagulation Profile: Recent Labs  Lab 01/28/18 0642 01/29/18 0533 01/31/18 1511  INR 1.24 1.18 1.37   CBC: Recent Labs  Lab 01/28/18 0642 01/29/18 0533 01/31/18 1511 01/31/18 1742 02/02/18 0457 02/03/18 0355  WBC 8.8 7.6 17.1*  --  6.1 10.2  NEUTROABS 6.7  --  15.5*  --   --   --   HGB 9.1* 8.4* 10.3* 11.2* 7.9* 9.6*  HCT 29.5* 28.4* 33.0* 33.0* 25.0* 30.3*  MCV 96.1 98.3 96.5  --  94.0 96.5  PLT 261 244 340  --  259 339   Cardiac Enzymes: Recent Labs  Lab 01/27/18 2055 01/31/18 1711 01/31/18 2258 02/01/18 0513 02/01/18 1120 02/01/18 1517  CKTOTAL   --  139  --   --   --   --   TROPONINI 0.07*  --  0.03* 0.04* 0.03* 0.03*   BNP: Invalid input(s): POCBNP CBG: Recent Labs  Lab 02/01/18 2004 02/01/18 2354 02/02/18 0405 02/02/18 0723 02/02/18 1132  GLUCAP 72 77 84 85 91   HbA1C: No results for input(s): HGBA1C in the last 72 hours. Urine analysis:    Component Value Date/Time   COLORURINE YELLOW 01/31/2018 [place] 01/31/2018 1726   LABSPEC 1.012 01/31/2018 1726   PHURINE 5.0 01/31/2018 1726   GLUCOSEU NEGATIVE 01/31/2018 1726   HGBUR SMALL (A) 01/31/2018 1726   BILIRUBINUR NEGATIVE 01/31/2018 1726   KETONESUR NEGATIVE 01/31/2018 1726   PROTEINUR 30 (A) 01/31/2018 1726   UROBILINOGEN 0.2 03/03/2010 1056   NITRITE NEGATIVE 01/31/2018  [place] 01/31/2018 1726   Sepsis Labs: @LABRCNTIP (procalcitonin:4,lacticidven:4) ) Recent Results (from the past 240 hour(s))  Culture, blood (routine x 2)     Status: None   Collection Time: 01/26/18 10:47 PM  Result Value Ref Range Status   Specimen Description BLOOD RIGHT ARM  Final   Special Requests   Final    BOTTLES DRAWN AEROBIC AND ANAEROBIC ANA BCAV AEB [place]   Culture   Final    NO GROWTH 5 DAYS Performed at [facility], [address] [place], [place] [address]    Report Status 01/31/2018 FINAL  Final  Culture, blood (routine x 2)     Status: None   Collection Time: 01/26/18 10:47 PM  Result Value Ref Range Status   Specimen Description BLOOD RIGHT ARM  Final   Special Requests   Final    BOTTLES DRAWN AEROBIC AND ANAEROBIC ANA BCAV AEB [place]   Culture   Final    NO GROWTH 5 DAYS Performed at [facility], [address] [place], [place] [address]    Report Status 01/31/2018 FINAL  Final  Urine culture     Status: None   Collection Time: 01/27/18  6:35 PM  Result Value Ref Range Status   Specimen Description   Final    URINE, CLEAN CATCH Performed at [facility], [address], [place], [place] [address]    Special  Requests   Final    Normal Performed at [facility], [address], [place], [place] [address]    Culture   Final    NO GROWTH Performed at [facility], [place] [address] [place], [place] [address]    Report Status 01/28/2018 FINAL  Final  Urine Culture     Status: None   Collection Time: 01/31/18  5:26 PM  Result Value Ref Range Status   Specimen Description   Final    URINE, CATHETERIZED Performed at [facility], [address] [place], [place] [address]    Special Requests   Final    NONE Performed at [facility], [address] [place], [place] [address]    Culture   Final    NO GROWTH Performed at [facility], [place] [address], [place], [place] [address]    Report Status 02/02/2018 FINAL  Final  Culture, blood (Routine X 2) w Reflex to ID Panel     Status: None (Preliminary result)   Collection Time: 01/31/18  7:05 PM  Result Value Ref Range Status   Specimen Description   Final    BLOOD RIGHT HAND BOTTLES DRAWN AEROBIC ONLY Blood Culture adequate volume   Special Requests NONE  Final   Culture   Final    NO GROWTH 3 DAYS Performed at [facility], [address], [place], [place] [address]    Report Status PENDING  Incomplete  Culture, blood (Routine X 2) w Reflex to ID Panel     Status: None (Preliminary result)   Collection Time: 01/31/18  7:10 PM  Result Value Ref Range Status   Specimen Description   Final    BLOOD LEFT HAND Blood Culture adequate volume BOTTLES DRAWN AEROBIC ONLY   Special Requests NONE  Final   Culture   Final    NO GROWTH 3 DAYS Performed at [facility], [address] [place],  [address]    Report Status PENDING  Incomplete     Scheduled Meds: . enoxaparin (LOVENOX) injection  1 mg/kg Subcutaneous  Q24H  . furosemide  80 mg Intravenous BID  . metoprolol tartrate  5 mg Intravenous Q8H  . pantoprazole  40 mg Oral Daily   Continuous Infusions: . piperacillin-tazobactam (ZOSYN)  IV 3.375 g (02/02/18 2230)  .   sodium bicarbonate infusion 1/4 NS 1000 mL 125 mL/hr at 02/03/18 0636  . vancomycin 20 mL/hr at 02/02/18 2230    Procedures/Studies: Dg Chest 2 View  Result Date: 01/26/2018 CLINICAL DATA:  Shortness of breath EXAM: CHEST - 2 VIEW COMPARISON:  01/09/2018, 12/04/2017 FINDINGS: Right-sided central venous catheter tip over the SVC. Bibasilar atelectasis or scarring. No pleural effusion. No focal consolidation. Stable cardiomediastinal silhouette with aortic atherosclerosis. No pneumothorax. IMPRESSION: No active cardiopulmonary disease. Stable scarring and/or atelectasis at both lung bases. Electronically Signed   By: Donavan Foil M.D.   On: 01/26/2018 23:23   Dg Chest 2 View  Result Date: 01/09/2018 CLINICAL DATA:  Short of breath EXAM: CHEST - 2 VIEW COMPARISON:  12/04/2017 FINDINGS: Right-sided central venous catheter tip over the SVC. Bilateral hyperinflation. Non inclusion of the CP angles on the lateral view. Streaky atelectasis or scarring at the bases. Stable borderline cardiomegaly. No pneumothorax. IMPRESSION: 1. Non inclusion of the lung bases on lateral view. 2. Streaky atelectasis or scarring at the bases. Electronically Signed   By: Donavan Foil M.D.   On: 01/09/2018 16:16   Dg Lumbar Spine 2-3 Views  Result Date: 01/28/2018 CLINICAL DATA:  Discitis EXAM: LUMBAR SPINE - 2-3 VIEW COMPARISON:  MRI from 01/06/2018 FINDINGS: Five lumbar type vertebral bodies are well visualized. Vertebral body height is well maintained at L1, L3-L4 and L5. Interbody fusion at L2-3 and L3-4 is seen. Increase kyphosis at L1-2 is noted related to interval superior endplate collapse at L2. MRI may be helpful for further evaluation. Multilevel osteophytic changes are noted. Diffuse vascular calcifications with right iliac stenting are noted. IMPRESSION: Progressive kyphosis at L1-2 related to superior endplate compression of L2. MRI may be helpful for further evaluation. Electronically Signed   By: Inez Catalina  M.D.   On: 01/28/2018 14:12   Ct Head Wo Contrast  Result Date: 01/31/2018 CLINICAL DATA:  Found unresponsive EXAM: CT HEAD WITHOUT CONTRAST CT CERVICAL SPINE WITHOUT CONTRAST TECHNIQUE: Multidetector CT imaging of the head and cervical spine was performed following the standard protocol without intravenous contrast. Multiplanar CT image reconstructions of the cervical spine were also generated. COMPARISON:  12/25/2017 FINDINGS: CT HEAD FINDINGS Brain: There is atrophy and chronic small vessel disease changes. No acute intracranial abnormality. Specifically, no hemorrhage, hydrocephalus, mass lesion, acute infarction, or significant intracranial injury. Vascular: No hyperdense vessel or unexpected calcification. Skull: No acute calvarial abnormality. Sinuses/Orbits: Diffuse mucosal thickening throughout the ethmoid air cells and right sphenoid sinus. Mastoid air cells clear. No orbital soft tissue abnormality. Other: None CT CERVICAL SPINE FINDINGS Alignment: No subluxation Skull base and vertebrae: No acute fracture. No primary bone lesion or focal pathologic process. Soft tissues and spinal canal: No prevertebral fluid or swelling. No visible canal hematoma. Disc levels: Prior anterior fusion from C3-C5. Diffuse degenerative disc and facet disease. Upper chest: Emphysematous changes in the apices. No acute findings. Other: Carotid artery calcifications bilaterally. IMPRESSION: No acute intracranial abnormality. Atrophy, chronic microvascular disease. Prior anterior fusion from C3-C5. Diffuse degenerative disc and facet disease in the cervical spine. No acute bony abnormality. Electronically Signed   By: Rolm Baptise M.D.   On: 01/31/2018 19:42   Ct Cervical Spine Wo Contrast  Result Date: 01/31/2018 CLINICAL DATA:  Found unresponsive EXAM: CT HEAD WITHOUT CONTRAST CT CERVICAL SPINE WITHOUT CONTRAST TECHNIQUE: Multidetector CT imaging of the head and cervical spine was performed following the standard  protocol without intravenous contrast. Multiplanar CT image reconstructions of the cervical spine were also generated. COMPARISON:  12/25/2017 FINDINGS: CT HEAD FINDINGS Brain: There is atrophy and chronic small vessel disease changes. No acute intracranial abnormality. Specifically, no hemorrhage, hydrocephalus, mass lesion, acute infarction, or significant intracranial injury. Vascular: No hyperdense vessel or unexpected calcification. Skull: No acute calvarial abnormality. Sinuses/Orbits: Diffuse mucosal thickening throughout the ethmoid air cells and right sphenoid sinus. Mastoid air cells clear. No orbital soft tissue abnormality. Other: None CT CERVICAL SPINE FINDINGS Alignment: No subluxation Skull base and vertebrae: No acute fracture. No primary bone lesion or focal pathologic process. Soft tissues and spinal canal: No prevertebral fluid or swelling. No visible canal hematoma. Disc levels: Prior anterior fusion from C3-C5. Diffuse degenerative disc and facet disease. Upper chest: Emphysematous changes in the apices. No acute findings. Other: Carotid artery calcifications bilaterally. IMPRESSION: No acute intracranial abnormality. Atrophy, chronic microvascular disease. Prior anterior fusion from C3-C5. Diffuse degenerative disc and facet disease in the cervical spine. No acute bony abnormality. Electronically Signed   By: Rolm Baptise M.D.   On: 01/31/2018 19:42   Ct Aspiration  Result Date: 01/10/2018 INDICATION: L1-2 and L4-5 discitis.  Left L2 psoas small abscess. EXAM: CT-GUIDED ASPIRATION MEDICATIONS: The patient is currently admitted to the hospital and receiving intravenous antibiotics. The antibiotics were administered within an appropriate time frame prior to the initiation of the procedure. ANESTHESIA/SEDATION: Fentanyl 50 mcg IV; Versed 1.5 mg IV Moderate Sedation Time:  10 minutes The patient was continuously monitored during the procedure by the interventional radiology nurse under my  direct supervision. COMPLICATIONS: None immediate. PROCEDURE: Informed written consent was obtained from the patient after a thorough discussion of the procedural risks, benefits and alternatives. All questions were addressed. Maximal Sterile Barrier Technique was utilized including caps, mask, sterile gowns, sterile gloves, sterile drape, hand hygiene and skin antiseptic. A timeout was performed prior to the initiation of the procedure. The back was prepped and draped in a sterile fashion. 1% lidocaine was utilized for local anesthesia. Under CT guidance, an 18 gauge needle was inserted into the left L2 paraspinal fluid collection. Aspiration yielded 2 drops bloody fluid. FINDINGS: Imaging demonstrates needle placement in the left L2 paraspinal fluid collection. IMPRESSION: Successful left paraspinal L2 aspiration yielding 2 drops bloody fluid. Electronically Signed   By: Marybelle Killings M.D.   On: 01/10/2018 16:00   Dg Chest Port 1 View  Result Date: 02/02/2018 CLINICAL DATA:  Bilateral inspiratory rales EXAM: PORTABLE CHEST 1 VIEW COMPARISON:  01/31/2018 FINDINGS: Endotracheal tube with the tip 7 cm above the carina. Nasogastric tube coursing below the diaphragm. Mild bilateral interstitial thickening. Possible trace left pleural effusion. No pneumothorax. Increased opacity in the left retrocardiac region likely related to technique. Stable cardiomediastinal silhouette. No acute osseous abnormality. IMPRESSION: 1. Endotracheal tube with the tip 7 cm above the carina. 2. Nasogastric tube coursing below the diaphragm. 3. Bilateral diffuse mild interstitial thickening which may reflect mild interstitial edema versus infection. Electronically Signed   By: Kathreen Devoid   On: 02/02/2018 10:19   Dg Chest Port 1 View  Result Date: 01/31/2018 CLINICAL DATA:  Intubated EXAM: PORTABLE CHEST 1 VIEW COMPARISON:  Chest radiograph from earlier today. FINDINGS: Endotracheal tube tip is 14.0 cm above the carina. Enteric  tube coils in cervical esophagus and enters the  stomach with the tip not seen on this image. Stable cardiomediastinal silhouette with normal heart size. No pneumothorax. No pleural effusion. No pulmonary edema. Mild left basilar scarring versus atelectasis. No acute consolidative airspace disease. IMPRESSION: 1. Endotracheal tube tip 14.0 cm above the carina, recommend advancing. 2. Enteric tube coils in the cervical esophagus and enters the stomach with the tip not seen on this image. 3. Mild left basilar scarring versus atelectasis. These results were called by telephone at the time of interpretation on 01/31/2018 at 6:30 pm to Dr. Noemi Chapel , who verbally acknowledged these results. Electronically Signed   By: Ilona Sorrel M.D.   On: 01/31/2018 18:32   Dg Chest Port 1 View  Result Date: 01/31/2018 CLINICAL DATA:  Unconscious patient. EXAM: PORTABLE CHEST 1 VIEW COMPARISON:  01/26/2018 FINDINGS: Lower portions of the thorax are excluded by collimation. Interval removal of dialysis catheter. Mildly enlarged cardiac silhouette. Mediastinal contours appear intact. There is no evidence of focal airspace consolidation, pleural effusion or pneumothorax. Osseous structures are without acute abnormality. Soft tissues are grossly normal. IMPRESSION: Mildly enlarged cardiac silhouette. Otherwise no acute abnormalities, accounting for excluding of the lower thorax. Electronically Signed   By: Fidela Salisbury M.D.   On: 01/31/2018 17:49    Orson Eva, DO  [facility] Pager [phone]  If 7PM-7AM, please contact night-coverage [email or web address] Password [email or web address] 02/03/2018, 10:54 AM   LOS: 3 days

## 2018-02-03 NOTE — Care Management Important Message (Signed)
Important Message  Patient Details  Name: HERRON FERO MRN: [id number] Date of Birth: 1955/09/18   Medicare Important Message Given:  Yes  Pt. Unable to sign  Shelda Altes 02/03/2018, 4:01 PM

## 2018-02-03 NOTE — Progress Notes (Signed)
Aaron [name]  MRN: [id number]  DOB/AGE: 11-17-1955 62 y.o.  Primary Care Physician:[facility], Brenda date: 01/31/2018  Chief Complaint:  Chief Complaint  Patient presents with  . unresponsive    S-Pt presented on  01/31/2018 with  Chief Complaint  Patient presents with  . unresponsive  .    Pt does not offer any complaints  Meds . enoxaparin (LOVENOX) injection  1 mg/kg Subcutaneous Q24H  . furosemide  80 mg Intravenous BID  . metoprolol tartrate  5 mg Intravenous Q8H  . pantoprazole  40 mg Oral Daily      Physical Exam: Vital signs in last 24 hours: Temp:  [96.3 F (35.7 C)-98.6 F (37 C)] 98 F (36.7 C) (10/23 0700) Pulse Rate:  [102-138] 120 (10/23 0800) Resp:  [12-22] 14 (10/23 0800) BP: (123-204)/(73-178) 161/110 (10/23 0800) SpO2:  [95 %-100 %] 100 % (10/23 0800) Weight:  [87.5 kg] 87.5 kg (10/23 0400) Weight change: 5.6 kg Last BM Date: 01/27/18  Intake/Output from previous day: 10/22 0701 - 10/23 0700 In: 2935.4 [I.V.:2673.7; IV Piggyback:261.7] Out: 3200 [Urine:3200] Total I/O In: 360 [P.O.:360] Out: -    Physical Exam: General- pt is awake but confused Resp- No acute REsp distress, Rhonchi+ CVS- S1S2 regular in rate and rhythm GIT- BS+, soft,non distended EXT- NO LE Edema, Cyanosis   Lab Results: CBC Recent Labs    02/02/18 0457 02/03/18 0355  WBC 6.1 10.2  HGB 7.9* 9.6*  HCT 25.0* 30.3*  PLT 259 339    BMET Recent Labs    02/02/18 0457 02/03/18 0355  NA 139 139  K 3.6 3.6  CL 113* 109  CO2 14* 21*  GLUCOSE 85 91  BUN 46* 44*  CREATININE 4.53* 4.68*  CALCIUM 8.3* 8.4*    Creat trend 2019  4.5=>5.3=>4.6 2018  4.8---5.5 2016  1.6--1.8 2010  1.5   MICRO Recent Results (from the past 240 hour(s))  Culture, blood (routine x 2)     Status: None   Collection Time: 01/26/18 10:47 PM  Result Value Ref Range Status   Specimen Description BLOOD RIGHT ARM  Final   Special Requests   Final     BOTTLES DRAWN AEROBIC AND ANAEROBIC ANA BCAV AEB [place]   Culture   Final    NO GROWTH 5 DAYS Performed at [facility], [address], [place], [place] [address]    Report Status 01/31/2018 FINAL  Final  Culture, blood (routine x 2)     Status: None   Collection Time: 01/26/18 10:47 PM  Result Value Ref Range Status   Specimen Description BLOOD RIGHT ARM  Final   Special Requests   Final    BOTTLES DRAWN AEROBIC AND ANAEROBIC ANA BCAV AEB [place]   Culture   Final    NO GROWTH 5 DAYS Performed at [facility], [address], [place], [place] [address]    Report Status 01/31/2018 FINAL  Final  Urine culture     Status: None   Collection Time: 01/27/18  6:35 PM  Result Value Ref Range Status   Specimen Description   Final    URINE, CLEAN CATCH Performed at [facility], [address], [place], [place] [address]    Special Requests   Final    Normal Performed at [facility], [address], [place], [place] [address]    Culture   Final    NO GROWTH Performed at [facility], [place] [address] [place], [place] [address]  Report Status 01/28/2018 FINAL  Final  Urine Culture     Status: None   Collection Time: 01/31/18  5:26 PM  Result Value Ref Range Status   Specimen Description   Final    URINE, CATHETERIZED Performed at [facility], [address], [place], [place] [address]    Special Requests   Final    NONE Performed at [facility], [address] [place], [place] [address]    Culture   Final    NO GROWTH Performed at [facility], [place] [address] [place], [place] [address]    Report Status 02/02/2018 FINAL  Final  Culture, blood (Routine X 2) w Reflex to ID Panel     Status: None (Preliminary result)   Collection Time: 01/31/18  7:05 PM  Result Value Ref Range Status   Specimen Description   Final    BLOOD RIGHT HAND BOTTLES DRAWN AEROBIC ONLY Blood Culture adequate volume   Special Requests NONE  Final   Culture   Final    NO GROWTH 3  DAYS Performed at [facility], [address], [place], [place] [address]    Report Status PENDING  Incomplete  Culture, blood (Routine X 2) w Reflex to ID Panel     Status: None (Preliminary result)   Collection Time: 01/31/18  7:10 PM  Result Value Ref Range Status   Specimen Description   Final    BLOOD LEFT HAND Blood Culture adequate volume BOTTLES DRAWN AEROBIC ONLY   Special Requests NONE  Final   Culture   Final    NO GROWTH 3 DAYS Performed at [facility], [address], [place], [place] [address]    Report Status PENDING  Incomplete      Lab Results  Component Value Date   PTH 269 (H) 08/31/2017   CALCIUM 8.4 (L) 02/03/2018   CAION 1.16 01/31/2018   PHOS 5.1 (H) 02/03/2018        Impression: 1)Renal CKD stage 5 .               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                Pt was on renal replacement therapy                RRT d/ced sec to non adherence to tx  And pt had some residual renal function.                 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 Calcium at  goal.  5)Resp-admitted with acute resp failure. Pt was intubated and now extubated Primary teamfolowing  6)Electrolytes  Normokalemic normonatremic   7)Acid base Co2  at goal better    Plan:   Will continue current care.       Gabbie Marzo S 02/03/2018, 9:36 AM

## 2018-02-03 NOTE — Progress Notes (Signed)
Pharmacy Antibiotic Note  Aaron [name] is a 62 y.o. male admitted on 01/31/2018 with HCAP pneumonia.  Pharmacy has been consulted for Vancomycin and zosyn dosing. Elevated Scr, not being dialyzed.   Plan: Vancomycin 1500mg  loading dose, then 1000mg  IV every 48 hours.  Goal trough 15-20 mcg/mL. Zosyn 3.375gm IV q12 hours EID over 4 hours Monitor labs, c/s, and levels as indicated.  Height: 6\' 4"  (193 cm) Weight: 192 lb 14.4 oz (87.5 kg) IBW/kg (Calculated) : 86.8  Temp (24hrs), Avg:97.7 F (36.5 C), Min:96.3 F (35.7 C), Max:98.6 F (37 C)  Recent Labs  Lab 01/28/18 0642 01/29/18 0533 01/31/18 1511 01/31/18 1742 01/31/18 2258 02/01/18 0513 02/02/18 0457 02/03/18 0355  WBC 8.8 7.6 17.1*  --   --   --  6.1 10.2  CREATININE 4.85* 4.96* 4.82* 5.30* 4.61* 4.64* 4.53* 4.68*  LATICACIDVEN  --   --   --  2.17* 1.1  --   --   --   VANCORANDOM  --  18  --   --   --   --   --   --     Estimated Creatinine Clearance: 20.4 mL/min (A) (by C-G formula based on SCr of 4.68 mg/dL (H)).   Normalized Cr Cl 13mls/min  No Known Allergies  Antimicrobials this admission: Vancomycin 10/20 >>  Zosyn 10/20>>   Dose adjustments this admission: N/A  Microbiology results: 10/20 BCx: ngtd 10/20 UCx: ng  Thank you for allowing pharmacy to be a part of this patient's care.  Margot Ables, PharmD Clinical Pharmacist 02/03/2018 9:18 AM

## 2018-02-04 DIAGNOSIS — J441 Chronic obstructive pulmonary disease with (acute) exacerbation: Secondary | ICD-10-CM

## 2018-02-04 LAB — PROTIME-INR
INR: 1.18
PROTHROMBIN TIME: 14.9 s (ref 11.4–15.2)

## 2018-02-04 LAB — CBC
HCT: 30.8 % — ABNORMAL LOW (ref 39.0–52.0)
Hemoglobin: 9.6 g/dL — ABNORMAL LOW (ref 13.0–17.0)
MCH: 30.4 pg (ref 26.0–34.0)
MCHC: 31.2 g/dL (ref 30.0–36.0)
MCV: 97.5 fL (ref 80.0–100.0)
Platelets: 270 10*3/uL (ref 150–400)
RBC: 3.16 MIL/uL — ABNORMAL LOW (ref 4.22–5.81)
RDW: 18.3 % — AB (ref 11.5–15.5)
WBC: 9.7 10*3/uL (ref 4.0–10.5)
nRBC: 0 % (ref 0.0–0.2)

## 2018-02-04 LAB — TRIGLYCERIDES: TRIGLYCERIDES: 70 mg/dL (ref <150)

## 2018-02-04 MED ORDER — PREDNISONE 20 MG PO TABS
60.0000 mg | ORAL_TABLET | Freq: Every day | ORAL | Status: DC
  Administered 2018-02-04 – 2018-02-08 (×5): 60 mg via ORAL
  Filled 2018-02-04 (×6): qty 3

## 2018-02-04 MED ORDER — DILTIAZEM HCL 60 MG PO TABS
60.0000 mg | ORAL_TABLET | Freq: Four times a day (QID) | ORAL | Status: DC
  Administered 2018-02-04 – 2018-02-05 (×5): 60 mg via ORAL
  Filled 2018-02-04 (×5): qty 1

## 2018-02-04 MED ORDER — DIPHENHYDRAMINE HCL 25 MG PO CAPS
25.0000 mg | ORAL_CAPSULE | Freq: Every evening | ORAL | Status: DC | PRN
  Administered 2018-02-04: 25 mg via ORAL
  Filled 2018-02-04: qty 1

## 2018-02-04 MED ORDER — SODIUM CHLORIDE 0.9 % IV SOLN
INTRAVENOUS | Status: DC
  Administered 2018-02-04 (×2): via INTRAVENOUS

## 2018-02-04 MED ORDER — FUROSEMIDE 10 MG/ML IJ SOLN
40.0000 mg | Freq: Two times a day (BID) | INTRAMUSCULAR | Status: DC
  Administered 2018-02-04 – 2018-02-05 (×4): 40 mg via INTRAVENOUS
  Filled 2018-02-04 (×4): qty 4

## 2018-02-04 MED ORDER — WARFARIN SODIUM 5 MG PO TABS
5.0000 mg | ORAL_TABLET | Freq: Once | ORAL | Status: AC
  Administered 2018-02-04: 5 mg via ORAL
  Filled 2018-02-04: qty 1

## 2018-02-04 MED ORDER — METOPROLOL TARTRATE 50 MG PO TABS
50.0000 mg | ORAL_TABLET | Freq: Two times a day (BID) | ORAL | Status: DC
  Administered 2018-02-04 – 2018-02-07 (×7): 50 mg via ORAL
  Filled 2018-02-04 (×7): qty 1

## 2018-02-04 MED ORDER — WARFARIN - PHARMACIST DOSING INPATIENT
Freq: Every day | Status: DC
  Administered 2018-02-05: 18:00:00

## 2018-02-04 NOTE — Progress Notes (Signed)
PROGRESS NOTE  KAIYDEN SIMKIN BJS:[id number] DOB: 1955/09/22 DOA: 01/31/2018 PCP: Gwenlyn Saran [place]  Brief History:  63 year old with a history of anxiety, osteoarthritis, atrial fibrillation, CAD, chronic back pain, lumbar discitis and paraspinal abscess COPD, ESRD previously on hemodialysis, GERD, hepatitis C, essential hypertension, PAD, medical noncompliance came to the ER when he was found by his family unconscious on the floor covered in his own stool and urine.  The patient apparently had agonal respirations with oxygen saturation 80% range.  He was intubated for airway protection.  The patient was recently admitted to the hospital from 01/26/2018 through 01/29/2018 during which the patient refused dialysis and was subsequently taken off of dialysis by nephrology.  His dialysis catheter was removed at that time.  The patient has had a long history of noncompliance including leaving AMA from the hospitalization 01/09/2018 through 01/13/2018.  The patient was most recently discharged on Augmentin and ciprofloxacin for his paraspinal abscess.  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 and hypnotics -Patient stated that he took some of his sister's "tranquilizers" and suboxone -Intubated in the emergency department -Extubated 02/02/2018 -wean oxygen off for saturation >60%  Acute metabolic and toxic encephalopathy -secondary to opioid overuse -The [facility] Controlled Substance Reporting System has been queried for this patient--the patient received Percocet 10 mg #120 on 12/29/2017 and subsequently an additional 60 Percocet filled since that time -Patient remains encephalopathic, but follows basic commands -UDS--positive opiates  CKD stage V -Nephrology following -Patient has refused dialysis; he was poorly compliant not going to his outpatient dialysis center -bicarbonate  drip per nephrology--started 02/02/2018-->stopped 02/04/18 -IVF per renal -Left arm AV fistula ligated secondary to bleeding 12/2017 -Continue furosemide per nephrology  Lumbar discitis and paraspinal abscess -01/10/2018 drainage--bacillus species -Was previously on vancomycin and ceftazidime when he was on dialysis -Continue vancomycin and Zosyn for now  COPD Exacerbation -Continue duonebs -continue pulmicort -short course prednisone  Elevated troponin -Secondary to ESRD -Trend is flat -No chest pain presently -Personally reviewed EKG--atrial fibrillation, nonspecific T wave changes  Chronic atrial fibrillation with RVR -increase diltiazem to 60 q 6 -01/12/2018 TEE--EF 50-55%, no vegetation, mild MR, HK anterior septal -INR was subtherapeutic at the time of admission -was initially on IV heparin/[place] enoxaparin earlier in the admission -restart warfarin  Essential hypertension -Previously on carvedilol prior to admission -Continue IV Lopressor for now>>>transition to po     Disposition Plan:   Home in 2-3 days  Family Communication:   daughter updated at bedside--Total time spent 35 minutes.  Greater than 50% spent face to face counseling and coordinating care.   Consultants:  nephrology  Code Status:  FULL   DVT Prophylaxis:  [place] Lovenox   Procedures: As Listed in Progress Note Above  Antibiotics: vanco 10/21>>> Zosyn 10/21>>>    Subjective: Pt /c/o some sob, about same as yesterday.  Complains of nonproductive cough.  No n/v/d.  No abd pain.  Denies cp, headache, f/c  Objective: Vitals:   02/04/18 0800 02/04/18 0827 02/04/18 0855 02/04/18 0900  BP: (!) 137/93   (!) 158/98  Pulse: (!) 120  (!) 103 (!) 106  Resp: (!) 26  17 15   Temp:  97.8 F (36.6 C)    TempSrc:  Axillary    SpO2: 94%  97% 99%  Weight:      Height:        Intake/Output Summary (Last 24 hours) at  02/04/2018 1050 Last data filed at 02/04/2018 0650 Gross per 24 hour    Intake 3690.92 ml  Output 3900 ml  Net -209.08 ml   Weight change:  Exam:   General:  Pt is alert, follows commands appropriately, not in acute distress  HEENT: No icterus, No thrush, No neck mass, [place]/AT  Cardiovascular: RRR, S1/S2, no rubs, no gallops  Respiratory: bibasilar rales and minimal basilar wheeze  Abdomen: Soft/+BS, non tender, non distended, no guarding  Extremities: No edema, No lymphangitis, No petechiae, No rashes, no synovitis   Data Reviewed: I have personally reviewed following labs and imaging studies Basic Metabolic Panel: Recent Labs  Lab 01/29/18 0533 01/31/18 1511 01/31/18 1742 01/31/18 2258 02/01/18 0513 02/02/18 0457 02/03/18 0355  NA 133* 134* 136 138 138 139 139  K 4.6 4.6 5.8* 4.4 5.0 3.6 3.6  CL 100 103 105 108 109 113* 109  CO2 22 21*  --  20* 18* 14* 21*  GLUCOSE 116* 137* 121* 114* 95 85 91  BUN 43* 49* 69* 46* 48* 46* 44*  CREATININE 4.96* 4.82* 5.30* 4.61* 4.64* 4.53* 4.68*  CALCIUM 8.8* 9.1  --  8.4* 8.5* 8.3* 8.4*  PHOS 5.0*  --   --   --   --  3.8 5.1*   Liver Function Tests: Recent Labs  Lab 01/29/18 0533 01/31/18 1511 02/01/18 0513 02/02/18 0457 02/03/18 0355  AST  --  21 20  --   --   ALT  --  9 9  --   --   ALKPHOS  --  116 84  --   --   BILITOT  --  0.8 0.8  --   --   PROT  --  7.6 6.2*  --   --   ALBUMIN 2.4* 3.1* 2.5* 2.3* 2.7*   No results for input(s): LIPASE, AMYLASE in the last 168 hours. No results for input(s): AMMONIA in the last 168 hours. Coagulation Profile: Recent Labs  Lab 01/29/18 0533 01/31/18 1511  INR 1.18 1.37   CBC: Recent Labs  Lab 01/29/18 0533 01/31/18 1511 01/31/18 1742 02/02/18 0457 02/03/18 0355 02/04/18 0409  WBC 7.6 17.1*  --  6.1 10.2 9.7  NEUTROABS  --  15.5*  --   --   --   --   HGB 8.4* 10.3* 11.2* 7.9* 9.6* 9.6*  HCT 28.4* 33.0* 33.0* 25.0* 30.3* 30.8*  MCV 98.3 96.5  --  94.0 96.5 97.5  PLT 244 340  --  259 339 270   Cardiac Enzymes: Recent Labs  Lab  01/31/18 1711 01/31/18 2258 02/01/18 0513 02/01/18 1120 02/01/18 1517  CKTOTAL 139  --   --   --   --   TROPONINI  --  0.03* 0.04* 0.03* 0.03*   BNP: Invalid input(s): POCBNP CBG: Recent Labs  Lab 02/01/18 2354 02/02/18 0405 02/02/18 0723 02/02/18 1132 02/03/18 0752  GLUCAP 77 84 85 91 98   HbA1C: No results for input(s): HGBA1C in the last 72 hours. Urine analysis:    Component Value Date/Time   COLORURINE YELLOW 01/31/2018 1726   APPEARANCEUR CLEAR 01/31/2018 1726   LABSPEC 1.012 01/31/2018 1726   PHURINE 5.0 01/31/2018 1726   GLUCOSEU NEGATIVE 01/31/2018 1726   HGBUR SMALL (A) 01/31/2018 1726   BILIRUBINUR NEGATIVE 01/31/2018 1726   KETONESUR NEGATIVE 01/31/2018 1726   PROTEINUR 30 (A) 01/31/2018 1726   UROBILINOGEN 0.2 03/03/2010 1056   NITRITE NEGATIVE 01/31/2018 1726   LEUKOCYTESUR NEGATIVE 01/31/2018 1726   Sepsis  Labs: @LABRCNTIP (procalcitonin:4,lacticidven:4) ) Recent Results (from the past 240 hour(s))  Culture, blood (routine x 2)     Status: None   Collection Time: 01/26/18 10:47 PM  Result Value Ref Range Status   Specimen Description BLOOD RIGHT ARM  Final   Special Requests   Final    BOTTLES DRAWN AEROBIC AND ANAEROBIC ANA BCAV AEB [place]   Culture   Final    NO GROWTH 5 DAYS Performed at [facility], [address], [place], [place] [address]    Report Status 01/31/2018 FINAL  Final  Culture, blood (routine x 2)     Status: None   Collection Time: 01/26/18 10:47 PM  Result Value Ref Range Status   Specimen Description BLOOD RIGHT ARM  Final   Special Requests   Final    BOTTLES DRAWN AEROBIC AND ANAEROBIC ANA BCAV AEB [place]   Culture   Final    NO GROWTH 5 DAYS Performed at [facility], [address] [place], [place] [address]    Report Status 01/31/2018 FINAL  Final  Urine culture     Status: None   Collection Time: 01/27/18  6:35 PM  Result Value Ref Range Status   Specimen Description   Final    URINE, CLEAN CATCH Performed at  [facility], [address] [place], [place] [address]    Special Requests   Final    Normal Performed at [facility], [address] [place], [place] [address]    Culture   Final    NO GROWTH Performed at [facility], [place] [address] [place], [place] [address]    Report Status 01/28/2018 FINAL  Final  Urine Culture     Status: None   Collection Time: 01/31/18  5:26 PM  Result Value Ref Range Status   Specimen Description   Final    URINE, CATHETERIZED Performed at [facility], [address], [place], [place] [address]    Special Requests   Final    NONE Performed at [facility], [address], [place], [place] [address]    Culture   Final    NO GROWTH Performed at [facility], [place] [address], [place], [place] [address]    Report Status 02/02/2018 FINAL  Final  Culture, blood (Routine X 2) w Reflex to ID Panel     Status: None (Preliminary result)   Collection Time: 01/31/18  7:05 PM  Result Value Ref Range Status   Specimen Description   Final    BLOOD RIGHT HAND BOTTLES DRAWN AEROBIC ONLY Blood Culture adequate volume   Special Requests NONE  Final   Culture   Final    NO GROWTH 4 DAYS Performed at [facility], [address], [place], [place] [address]    Report Status PENDING  Incomplete  Culture, blood (Routine X 2) w Reflex to ID Panel     Status: None (Preliminary result)   Collection Time: 01/31/18  7:10 PM  Result Value Ref Range Status   Specimen Description   Final    BLOOD LEFT HAND Blood Culture adequate volume BOTTLES DRAWN AEROBIC ONLY   Special Requests NONE  Final   Culture   Final    NO GROWTH 4 DAYS Performed at [facility], [address] [place], [place] [address]    Report Status PENDING  Incomplete     Scheduled Meds: . budesonide (PULMICORT) nebulizer solution  0.5 mg Nebulization BID  . diltiazem  30 mg Oral Q6H  .  enoxaparin (LOVENOX) injection  1 mg/kg Subcutaneous Q24H  . furosemide  40 mg Intravenous BID  .  ipratropium-albuterol  3 mL Nebulization Q6H  . metoprolol tartrate  5 mg Intravenous Q6H  . pantoprazole  40 mg Oral Daily   Continuous Infusions: . sodium chloride Stopped (02/04/18 0555)  . sodium chloride 125 mL/hr at 02/04/18 0831  . piperacillin-tazobactam (ZOSYN)  IV 3.375 g (02/04/18 1009)  . vancomycin 20 mL/hr at 02/02/18 2230    Procedures/Studies: Dg Chest 2 View  Result Date: 01/26/2018 CLINICAL DATA:  Shortness of breath EXAM: CHEST - 2 VIEW COMPARISON:  01/09/2018, 12/04/2017 FINDINGS: Right-sided central venous catheter tip over the SVC. Bibasilar atelectasis or scarring. No pleural effusion. No focal consolidation. Stable cardiomediastinal silhouette with aortic atherosclerosis. No pneumothorax. IMPRESSION: No active cardiopulmonary disease. Stable scarring and/or atelectasis at both lung bases. Electronically Signed   By: Donavan Foil M.D.   On: 01/26/2018 23:23   Dg Chest 2 View  Result Date: 01/09/2018 CLINICAL DATA:  Short of breath EXAM: CHEST - 2 VIEW COMPARISON:  12/04/2017 FINDINGS: Right-sided central venous catheter tip over the SVC. Bilateral hyperinflation. Non inclusion of the CP angles on the lateral view. Streaky atelectasis or scarring at the bases. Stable borderline cardiomegaly. No pneumothorax. IMPRESSION: 1. Non inclusion of the lung bases on lateral view. 2. Streaky atelectasis or scarring at the bases. Electronically Signed   By: Donavan Foil M.D.   On: 01/09/2018 16:16   Dg Lumbar Spine 2-3 Views  Result Date: 01/28/2018 CLINICAL DATA:  Discitis EXAM: LUMBAR SPINE - 2-3 VIEW COMPARISON:  MRI from 01/06/2018 FINDINGS: Five lumbar type vertebral bodies are well visualized. Vertebral body height is well maintained at L1, L3-L4 and L5. Interbody fusion at L2-3 and L3-4 is seen. Increase kyphosis at L1-2 is noted related to interval superior endplate collapse at L2. MRI may be helpful for further evaluation. Multilevel osteophytic changes are noted.  Diffuse vascular calcifications with right iliac stenting are noted. IMPRESSION: Progressive kyphosis at L1-2 related to superior endplate compression of L2. MRI may be helpful for further evaluation. Electronically Signed   By: Inez Catalina M.D.   On: 01/28/2018 14:12   Ct Head Wo Contrast  Result Date: 01/31/2018 CLINICAL DATA:  Found unresponsive EXAM: CT HEAD WITHOUT CONTRAST CT CERVICAL SPINE WITHOUT CONTRAST TECHNIQUE: Multidetector CT imaging of the head and cervical spine was performed following the standard protocol without intravenous contrast. Multiplanar CT image reconstructions of the cervical spine were also generated. COMPARISON:  12/25/2017 FINDINGS: CT HEAD FINDINGS Brain: There is atrophy and chronic small vessel disease changes. No acute intracranial abnormality. Specifically, no hemorrhage, hydrocephalus, mass lesion, acute infarction, or significant intracranial injury. Vascular: No hyperdense vessel or unexpected calcification. Skull: No acute calvarial abnormality. Sinuses/Orbits: Diffuse mucosal thickening throughout the ethmoid air cells and right sphenoid sinus. Mastoid air cells clear. No orbital soft tissue abnormality. Other: None CT CERVICAL SPINE FINDINGS Alignment: No subluxation Skull base and vertebrae: No acute fracture. No primary bone lesion or focal pathologic process. Soft tissues and spinal canal: No prevertebral fluid or swelling. No visible canal hematoma. Disc levels: Prior anterior fusion from C3-C5. Diffuse degenerative disc and facet disease. Upper chest: Emphysematous changes in the apices. No acute findings. Other: Carotid artery calcifications bilaterally. IMPRESSION: No acute intracranial abnormality. Atrophy, chronic microvascular disease. Prior anterior fusion from C3-C5. Diffuse degenerative disc and facet disease in the cervical spine. No acute bony abnormality. Electronically Signed   By: Rolm Baptise M.D.  On: 01/31/2018 19:42   Ct Cervical Spine Wo  Contrast  Result Date: 01/31/2018 CLINICAL DATA:  Found unresponsive EXAM: CT HEAD WITHOUT CONTRAST CT CERVICAL SPINE WITHOUT CONTRAST TECHNIQUE: Multidetector CT imaging of the head and cervical spine was performed following the standard protocol without intravenous contrast. Multiplanar CT image reconstructions of the cervical spine were also generated. COMPARISON:  12/25/2017 FINDINGS: CT HEAD FINDINGS Brain: There is atrophy and chronic small vessel disease changes. No acute intracranial abnormality. Specifically, no hemorrhage, hydrocephalus, mass lesion, acute infarction, or significant intracranial injury. Vascular: No hyperdense vessel or unexpected calcification. Skull: No acute calvarial abnormality. Sinuses/Orbits: Diffuse mucosal thickening throughout the ethmoid air cells and right sphenoid sinus. Mastoid air cells clear. No orbital soft tissue abnormality. Other: None CT CERVICAL SPINE FINDINGS Alignment: No subluxation Skull base and vertebrae: No acute fracture. No primary bone lesion or focal pathologic process. Soft tissues and spinal canal: No prevertebral fluid or swelling. No visible canal hematoma. Disc levels: Prior anterior fusion from C3-C5. Diffuse degenerative disc and facet disease. Upper chest: Emphysematous changes in the apices. No acute findings. Other: Carotid artery calcifications bilaterally. IMPRESSION: No acute intracranial abnormality. Atrophy, chronic microvascular disease. Prior anterior fusion from C3-C5. Diffuse degenerative disc and facet disease in the cervical spine. No acute bony abnormality. Electronically Signed   By: Rolm Baptise M.D.   On: 01/31/2018 19:42   Ct Aspiration  Result Date: 01/10/2018 INDICATION: L1-2 and L4-5 discitis.  Left L2 psoas small abscess. EXAM: CT-GUIDED ASPIRATION MEDICATIONS: The patient is currently admitted to the hospital and receiving intravenous antibiotics. The antibiotics were administered within an appropriate time frame prior  to the initiation of the procedure. ANESTHESIA/SEDATION: Fentanyl 50 mcg IV; Versed 1.5 mg IV Moderate Sedation Time:  10 minutes The patient was continuously monitored during the procedure by the interventional radiology nurse under my direct supervision. COMPLICATIONS: None immediate. PROCEDURE: Informed written consent was obtained from the patient after a thorough discussion of the procedural risks, benefits and alternatives. All questions were addressed. Maximal Sterile Barrier Technique was utilized including caps, mask, sterile gowns, sterile gloves, sterile drape, hand hygiene and skin antiseptic. A timeout was performed prior to the initiation of the procedure. The back was prepped and draped in a sterile fashion. 1% lidocaine was utilized for local anesthesia. Under CT guidance, an 18 gauge needle was inserted into the left L2 paraspinal fluid collection. Aspiration yielded 2 drops bloody fluid. FINDINGS: Imaging demonstrates needle placement in the left L2 paraspinal fluid collection. IMPRESSION: Successful left paraspinal L2 aspiration yielding 2 drops bloody fluid. Electronically Signed   By: Marybelle Killings M.D.   On: 01/10/2018 16:00   US Venous Img Upper Uni Right  Result Date: 02/03/2018 CLINICAL DATA:  Right upper extremity pain and edema. History of recent dialysis catheter removal. Evaluate for DVT. EXAM: RIGHT UPPER EXTREMITY VENOUS DOPPLER ULTRASOUND TECHNIQUE: Gray-scale sonography with graded compression, as well as color Doppler and duplex ultrasound were performed to evaluate the upper extremity deep venous system from the level of the subclavian vein and including the jugular, axillary, basilic, radial, ulnar and upper cephalic vein. Spectral Doppler was utilized to evaluate flow at rest and with distal augmentation maneuvers. COMPARISON:  None. FINDINGS: Contralateral Subclavian Vein: Respiratory phasicity is normal and symmetric with the symptomatic side. No evidence of thrombus.  Normal compressibility. Internal Jugular Vein: No evidence of thrombus. Normal compressibility, respiratory phasicity and response to augmentation. Subclavian Vein: No evidence of thrombus. Normal compressibility, respiratory phasicity and response to augmentation.  Axillary Vein: No evidence of thrombus. Normal compressibility, respiratory phasicity and response to augmentation. Cephalic Vein: No evidence of thrombus. Normal compressibility, respiratory phasicity and response to augmentation. Basilic Vein: No evidence of thrombus. Normal compressibility, respiratory phasicity and response to augmentation. Brachial Veins: No evidence of thrombus. Normal compressibility, respiratory phasicity and response to augmentation. Radial Veins: No evidence of thrombus. Normal compressibility, respiratory phasicity and response to augmentation. Ulnar Veins: No evidence of thrombus. Normal compressibility, respiratory phasicity and response to augmentation. Venous Reflux:  None visualized. Other Findings:  None visualized. IMPRESSION: No evidence of DVT within the right upper extremity. Electronically Signed   By: Sandi Mariscal M.D.   On: 02/03/2018 15:27   Dg Chest Port 1 View  Result Date: 02/02/2018 CLINICAL DATA:  Bilateral inspiratory rales EXAM: PORTABLE CHEST 1 VIEW COMPARISON:  01/31/2018 FINDINGS: Endotracheal tube with the tip 7 cm above the carina. Nasogastric tube coursing below the diaphragm. Mild bilateral interstitial thickening. Possible trace left pleural effusion. No pneumothorax. Increased opacity in the left retrocardiac region likely related to technique. Stable cardiomediastinal silhouette. No acute osseous abnormality. IMPRESSION: 1. Endotracheal tube with the tip 7 cm above the carina. 2. Nasogastric tube coursing below the diaphragm. 3. Bilateral diffuse mild interstitial thickening which may reflect mild interstitial edema versus infection. Electronically Signed   By: Kathreen Devoid   On: 02/02/2018  10:19   Dg Chest Port 1 View  Result Date: 01/31/2018 CLINICAL DATA:  Intubated EXAM: PORTABLE CHEST 1 VIEW COMPARISON:  Chest radiograph from earlier today. FINDINGS: Endotracheal tube tip is 14.0 cm above the carina. Enteric tube coils in cervical esophagus and enters the stomach with the tip not seen on this image. Stable cardiomediastinal silhouette with normal heart size. No pneumothorax. No pleural effusion. No pulmonary edema. Mild left basilar scarring versus atelectasis. No acute consolidative airspace disease. IMPRESSION: 1. Endotracheal tube tip 14.0 cm above the carina, recommend advancing. 2. Enteric tube coils in the cervical esophagus and enters the stomach with the tip not seen on this image. 3. Mild left basilar scarring versus atelectasis. These results were called by telephone at the time of interpretation on 01/31/2018 at 6:30 pm to Dr. Noemi Chapel , who verbally acknowledged these results. Electronically Signed   By: Ilona Sorrel M.D.   On: 01/31/2018 18:32   Dg Chest Port 1 View  Result Date: 01/31/2018 CLINICAL DATA:  Unconscious patient. EXAM: PORTABLE CHEST 1 VIEW COMPARISON:  01/26/2018 FINDINGS: Lower portions of the thorax are excluded by collimation. Interval removal of dialysis catheter. Mildly enlarged cardiac silhouette. Mediastinal contours appear intact. There is no evidence of focal airspace consolidation, pleural effusion or pneumothorax. Osseous structures are without acute abnormality. Soft tissues are grossly normal. IMPRESSION: Mildly enlarged cardiac silhouette. Otherwise no acute abnormalities, accounting for excluding of the lower thorax. Electronically Signed   By: Fidela Salisbury M.D.   On: 01/31/2018 17:49    Orson Eva, DO  [facility] Pager [phone]  If 7PM-7AM, please contact night-coverage [email or web address] Password [email or web address] 02/04/2018, 10:50 AM   LOS: 4 days

## 2018-02-04 NOTE — Care Management Note (Signed)
Case Management Note  Patient Details  Name: Aaron [name] MRN: [id number] Date of Birth: 08-Feb-1956  Subjective/Objective:    Found down at home. Recent discharge from hospital (01/29/18) for lumbar abscess. Left AMA from hospital on 01/13/2018. History of non compliance.  PT eval pending. Discussed with attending. Patient deconditioned, ? Needs SNF placement. Patient agreeable if needed.                 Action/Plan: CM following. If patient does not DC to SNF, will need home health services. Patient is high risk for readmission.   Expected Discharge Date:     unk             Expected Discharge Plan:     In-House Referral:     Discharge planning Services  CM Consult  Post Acute Care Choice:    Choice offered to:     DME Arranged:    DME Agency:     HH Arranged:    HH Agency:     Status of Service:  In process, will continue to follow  If discussed at Long Length of Stay Meetings, dates discussed:    Additional Comments:  Aaron [name], Aaron Reading, RN 02/04/2018, 2:41 PM

## 2018-02-04 NOTE — Progress Notes (Signed)
ANTICOAGULATION CONSULT NOTE - Follow up [facility] for Heparin >> Lovenox>> Coumadin Indication: atrial fibrillation  No Known Allergies  Patient Measurements: Height: 6\' 4"  (193 cm) Weight: 192 lb 14.4 oz (87.5 kg) IBW/kg (Calculated) : 86.8 HEPARIN DW (KG): 85  Vital Signs: Temp: 97.8 F (36.6 C) (10/24 0827) Temp Source: Axillary (10/24 0827) BP: 171/122 (10/24 1154) Pulse Rate: 118 (10/24 1154)  Labs: Recent Labs    02/01/18 1517  02/02/18 0457 02/02/18 1535 02/02/18 1904 02/03/18 0355 02/04/18 0409  HGB  --    < > 7.9*  --   --  9.6* 9.6*  HCT  --   --  25.0*  --   --  30.3* 30.8*  PLT  --   --  259  --   --  339 270  HEPARINUNFRC <0.10*   < > <0.10* <0.10* <0.10*  --   --   CREATININE  --   --  4.53*  --   --  4.68*  --   TROPONINI 0.03*  --   --   --   --   --   --    < > = values in this interval not displayed.    Estimated Creatinine Clearance: 20.4 mL/min (A) (by C-G formula based on SCr of 4.68 mg/dL (H)).  Assessment: 62 y.o. male, with history of anxiety, osteoarthritis, atrial fibrillation on chronic anticoagulation with Coumadin, CAD, chronic back pain, COPD, ESRD who was previously on hemodialysis, nephrology had stopped dialysis as patient had been noncompliant and started making urine as per note from previous hospitalization from 01/29/2018, dialysis catheter was removed.  Patient recently was admitted with sepsis due to paraspinal abscess status post drainage, lumbar discitis.  He was discharged home on ciprofloxacin and Augmentin for 6 weeks of therapy.  At that time he also had a TEE which was negative for vegetation. Patient has been on Coumadin for afib.   Transitioned from Heparin to SQ Lovenox. Now will add coumadin back since extubated and bridge until INR > 2     Goal of Therapy:  INR 2-3 Monitor platelets by anticoagulation protocol: Yes   Plan:  Lovenox 1mg /kg SQ every 24 hours (adjusted to renal function) Coumadin 5mg  po  x 1 today PT-INR daily Continue to monitor H&H and platelets  Thanks for allowing pharmacy to be a part of this patient's care.  Isac Sarna, BS [place], [place] Clinical Pharmacist Pager [phone] 02/04/2018 12:03 PM

## 2018-02-04 NOTE — Progress Notes (Signed)
Pt requesting something mild for sleep- Dr. Kennon Holter paged and made aware. Pt states he has not slept in 2 days. Waiting for orders/call back.

## 2018-02-04 NOTE — Progress Notes (Signed)
Subjective: Interval History:   Patient presently denies any difficulty breathing.  His appetite is good and he does not have any nausea or vomiting.  Objective: Vital signs in last 24 hours: Temp:  [97.6 F (36.4 C)-98.5 F (36.9 C)] 97.7 F (36.5 C) (10/24 0445) Pulse Rate:  [97-144] 106 (10/24 0600) Resp:  [8-23] 10 (10/24 0600) BP: (138-176)/(89-132) 158/132 (10/24 0600) SpO2:  [71 %-100 %] 96 % (10/24 0729) Weight change:   Intake/Output from previous day: 10/23 0701 - 10/24 0700 In: 4050.9 [P.O.:840; I.V.:2994.9; IV Piggyback:216] Out: 3900 [Urine:3900] Intake/Output this shift: No intake/output data recorded.  Patient is alert and in no apparent distress. Chest: Clear to auscultation, no wheezing. Heart exam revealed iregular rate and rhythm no murmur Abdomen: Soft positive bowel sounds Extremities no edema  Lab Results: Recent Labs    02/03/18 0355 02/04/18 0409  WBC 10.2 9.7  HGB 9.6* 9.6*  HCT 30.3* 30.8*  PLT 339 270   BMET:  Recent Labs    02/02/18 0457 02/03/18 0355  NA 139 139  K 3.6 3.6  CL 113* 109  CO2 14* 21*  GLUCOSE 85 91  BUN 46* 44*  CREATININE 4.53* 4.68*  CALCIUM 8.3* 8.4*   No results for input(s): PTH in the last 72 hours. Iron Studies:  Recent Labs    02/02/18 1916  IRON 18*  TIBC 148*  FERRITIN 182    Studies/Results: US Venous Img Upper Uni Right  Result Date: 02/03/2018 CLINICAL DATA:  Right upper extremity pain and edema. History of recent dialysis catheter removal. Evaluate for DVT. EXAM: RIGHT UPPER EXTREMITY VENOUS DOPPLER ULTRASOUND TECHNIQUE: Gray-scale sonography with graded compression, as well as color Doppler and duplex ultrasound were performed to evaluate the upper extremity deep venous system from the level of the subclavian vein and including the jugular, axillary, basilic, radial, ulnar and upper cephalic vein. Spectral Doppler was utilized to evaluate flow at rest and with distal augmentation maneuvers.  COMPARISON:  None. FINDINGS: Contralateral Subclavian Vein: Respiratory phasicity is normal and symmetric with the symptomatic side. No evidence of thrombus. Normal compressibility. Internal Jugular Vein: No evidence of thrombus. Normal compressibility, respiratory phasicity and response to augmentation. Subclavian Vein: No evidence of thrombus. Normal compressibility, respiratory phasicity and response to augmentation. Axillary Vein: No evidence of thrombus. Normal compressibility, respiratory phasicity and response to augmentation. Cephalic Vein: No evidence of thrombus. Normal compressibility, respiratory phasicity and response to augmentation. Basilic Vein: No evidence of thrombus. Normal compressibility, respiratory phasicity and response to augmentation. Brachial Veins: No evidence of thrombus. Normal compressibility, respiratory phasicity and response to augmentation. Radial Veins: No evidence of thrombus. Normal compressibility, respiratory phasicity and response to augmentation. Ulnar Veins: No evidence of thrombus. Normal compressibility, respiratory phasicity and response to augmentation. Venous Reflux:  None visualized. Other Findings:  None visualized. IMPRESSION: No evidence of DVT within the right upper extremity. Electronically Signed   By: Sandi Mariscal M.D.   On: 02/03/2018 15:27   Dg Chest Port 1 View  Result Date: 02/02/2018 CLINICAL DATA:  Bilateral inspiratory rales EXAM: PORTABLE CHEST 1 VIEW COMPARISON:  01/31/2018 FINDINGS: Endotracheal tube with the tip 7 cm above the carina. Nasogastric tube coursing below the diaphragm. Mild bilateral interstitial thickening. Possible trace left pleural effusion. No pneumothorax. Increased opacity in the left retrocardiac region likely related to technique. Stable cardiomediastinal silhouette. No acute osseous abnormality. IMPRESSION: 1. Endotracheal tube with the tip 7 cm above the carina. 2. Nasogastric tube coursing below the diaphragm. 3. Bilateral  diffuse mild interstitial thickening which may reflect mild interstitial edema versus infection. Electronically Signed   By: Kathreen Devoid   On: 02/02/2018 10:19    I have reviewed the patient's current medications.  Assessment/Plan: 1] acute on chronic renal failure: Presently his creatinine is stable.  He is stage V.  His renal function remains a stable.  Presently he does not have any uremic signs and symptoms.  His renal panel is pending this morning. 2] altered mental status: Has improved.  His mental status seems to have returned to his baseline. 3] respiratory failure: Patient is extubated.  Presently he is on Lasix.  He had 3900 cc of urine output.  No sign of fluid overload. 4] bone and mineral disorder: Calcium and phosphorus is range 5] anemia: His hemoglobin is low but stable.  His ferritin is 12% and ferritin of 182.  Possibly a combination of iron deficiency anemia and anemia of chronic disease. 6] hypertension: His blood pressure is reasonably controlled 7] history of paroxysmal atrial tachycardia: His heart rate is controlled 8] low CO2: Possibly metabolic.  Patient is on sodium bicarb and his CO2 is 21 has improved. Plan: 1] we will DC sodium bicarb and start patient on normal saline at 125 cc/h 2] decrease Lasix to 40 mg IV twice daily 3] we will check his renal panel and CBC in the morning. .   LOS: 4 days   Adrin Julian S 02/04/2018,7:56 AM

## 2018-02-04 NOTE — Progress Notes (Signed)
Pt c/o shortness of breath after getting up to [facility]. 2L [place] applied per pt request. Will continue to monitor pt

## 2018-02-05 LAB — CBC
HEMATOCRIT: 29.7 % — AB (ref 39.0–52.0)
Hemoglobin: 9.3 g/dL — ABNORMAL LOW (ref 13.0–17.0)
MCH: 29.5 pg (ref 26.0–34.0)
MCHC: 31.3 g/dL (ref 30.0–36.0)
MCV: 94.3 fL (ref 80.0–100.0)
Platelets: 320 10*3/uL (ref 150–400)
RBC: 3.15 MIL/uL — ABNORMAL LOW (ref 4.22–5.81)
RDW: 18.3 % — AB (ref 11.5–15.5)
WBC: 8.8 10*3/uL (ref 4.0–10.5)
nRBC: 0 % (ref 0.0–0.2)

## 2018-02-05 LAB — CULTURE, BLOOD (ROUTINE X 2)
CULTURE: NO GROWTH
Culture: NO GROWTH

## 2018-02-05 LAB — RENAL FUNCTION PANEL
Albumin: 2.8 g/dL — ABNORMAL LOW (ref 3.5–5.0)
Anion gap: 14 (ref 5–15)
BUN: 39 mg/dL — AB (ref 8–23)
CHLORIDE: 97 mmol/L — AB (ref 98–111)
CO2: 27 mmol/L (ref 22–32)
Calcium: 8.4 mg/dL — ABNORMAL LOW (ref 8.9–10.3)
Creatinine, Ser: 4.27 mg/dL — ABNORMAL HIGH (ref 0.61–1.24)
GFR, EST AFRICAN AMERICAN: 16 mL/min — AB (ref >60)
GFR, EST NON AFRICAN AMERICAN: 14 mL/min — AB (ref >60)
Glucose, Bld: 124 mg/dL — ABNORMAL HIGH (ref 70–99)
POTASSIUM: 3.9 mmol/L (ref 3.5–5.1)
Phosphorus: 5.1 mg/dL — ABNORMAL HIGH (ref 2.5–4.6)
Sodium: 138 mmol/L (ref 135–145)

## 2018-02-05 LAB — PROTIME-INR
INR: 1.38
PROTHROMBIN TIME: 16.8 s — AB (ref 11.4–15.2)

## 2018-02-05 MED ORDER — DILTIAZEM HCL 60 MG PO TABS
90.0000 mg | ORAL_TABLET | Freq: Four times a day (QID) | ORAL | Status: AC
  Administered 2018-02-05 – 2018-02-06 (×3): 90 mg via ORAL
  Filled 2018-02-05 (×3): qty 1

## 2018-02-05 MED ORDER — WARFARIN SODIUM 5 MG PO TABS
5.0000 mg | ORAL_TABLET | Freq: Once | ORAL | Status: AC
  Administered 2018-02-05: 5 mg via ORAL
  Filled 2018-02-05: qty 1

## 2018-02-05 MED ORDER — DILTIAZEM HCL ER COATED BEADS 180 MG PO CP24: 360.0000 mg | ORAL_CAPSULE | Freq: Every day | ORAL | Status: DC

## 2018-02-05 MED ORDER — DILTIAZEM HCL ER COATED BEADS 180 MG PO CP24
360.0000 mg | ORAL_CAPSULE | Freq: Every day | ORAL | Status: DC
  Administered 2018-02-06 – 2018-02-08 (×3): 360 mg via ORAL
  Filled 2018-02-05 (×3): qty 2

## 2018-02-05 NOTE — Clinical Social Work Note (Signed)
Clinical Social Work Assessment  Patient Details  Name: Aaron [name] MRN: [id number] Date of Birth: 1955-12-18  Date of referral:  02/05/18               Reason for consult:  Discharge Planning                Permission sought to share information with:  Chartered certified accountant granted to share information::  Yes, Verbal Permission Granted  Name::        Agency::  [facility]  Relationship::     Contact Information:     Housing/Transportation Living arrangements for the past 2 months:  [place] of Information:  Patient Patient Interpreter Needed:  None Criminal Activity/Legal Involvement Pertinent to Current Situation/Hospitalization:  No - Comment as needed Significant Relationships:  Spouse Lives with:  Spouse Do you feel safe going back to the place where you live?  Yes Need for family participation in patient care:  No (Coment)  Care giving concerns: PT recommends SNF    Social Worker assessment / plan: Pt is a 62 year old male referred to CSW for SNF rehab. Met with pt this AM to assess. Per pt, he lives with his wife in [place]. He is agreeable to SNF for rehab. Will send referrals to requested facilities and follow for dc planning. Pt will need insurance authorization before he can dc to SNF.  Employment status:  Disabled (Comment on whether or not currently receiving Disability) Insurance information:  Programmer, applications, Medicaid In [place] PT Recommendations:  [place] / Referral to community resources:  [place]  Patient/Family's Response to care: Pt accepting of care.  Patient/Family's Understanding of and Emotional Response to Diagnosis, Current Treatment, and Prognosis: Pt appears to have understanding of diagnosis and treatment recommendations. He is agreeable to SNF rehab. No emotional distress identified.  Emotional Assessment Appearance:  Appears stated age Attitude/Demeanor/Rapport:   Engaged Affect (typically observed):  Pleasant Orientation:  Oriented to Self, Oriented to Place, Oriented to  Time, Oriented to Situation Alcohol / Substance use:  Not Applicable Psych involvement (Current and /or in the community):  No (Comment)  Discharge Needs  Concerns to be addressed:  Discharge Planning Concerns Readmission within the last 30 days:  Yes Current discharge risk:  Physical Impairment, Dependent with Mobility Barriers to Discharge:  [place], LCSW 02/05/2018, 10:43 AM

## 2018-02-05 NOTE — Evaluation (Addendum)
Physical Therapy Evaluation Patient Details Name: Aaron [name] MRN: [id number] DOB: Sep 13, 1955 Today's Date: 02/05/2018   History of Present Illness  Aaron [name]  is a 62 y.o. male, with history of anxiety, osteoarthritis, atrial fibrillation on chronic anticoagulation with Coumadin, CAD, chronic back pain, COPD, ESRD who was previously on hemodialysis, nephrology had stopped dialysis as patient had been noncompliant and started making urine as per note from previous hospitalization from 01/29/2018, dialysis catheter was removed.  Patient recently was admitted with sepsis due to paraspinal abscess status post drainage, lumbar discitis.  He was discharged home on ciprofloxacin and Augmentin for 6 weeks of therapy.  At that time he also had a TEE which was negative for vegetation.    Clinical Impression  Patient limited for functional mobility as stated below secondary to BLE weakness, fatigue and poor standing balance.  Patient on room air during gait training with O2 saturations above 93% - RN notified and patient left on room air after while sitting in chair after therapy.  Patient will benefit from continued physical therapy in hospital and recommended venue below to increase strength, balance, endurance for safe ADLs and gait.    Follow Up Recommendations SNF;Supervision/Assistance - 24 hour;Supervision for mobility/OOB    Equipment Recommendations  None recommended by PT    Recommendations for Other Services       Precautions / Restrictions Precautions Precautions: Fall Restrictions Weight Bearing Restrictions: No      Mobility  Bed Mobility Overal bed mobility: Needs Assistance Bed Mobility: Supine to Sit     Supine to sit: Supervision        Transfers Overall transfer level: Needs assistance Equipment used: Rolling walker (2 wheeled) Transfers: Sit to/from [facility] Sit to Stand: Min assist Stand pivot transfers: Min assist       General  transfer comment: slow labored movement  Ambulation/Gait Ambulation/Gait assistance: Min assist;Mod assist Gait Distance (Feet): 25 Feet Assistive device: Rolling walker (2 wheeled) Gait Pattern/deviations: Decreased step length - right;Decreased step length - left;Decreased stride length Gait velocity: slow   General Gait Details: slow labored unsteady movement, limited mostly due to c/o fatigue  Stairs            Wheelchair Mobility    Modified Rankin (Stroke Patients Only)       Balance Overall balance assessment: Needs assistance Sitting-balance support: Feet supported;Bilateral upper extremity supported Sitting balance-Leahy Scale: Good     Standing balance support: During functional activity;No upper extremity supported Standing balance-Leahy Scale: Poor Standing balance comment: fair with RW                             Pertinent Vitals/Pain Pain Assessment: 0-10 Pain Score: 7  Pain Location: low back Pain Descriptors / Indicators: Aching Pain Intervention(s): Limited activity within patient's tolerance;Monitored during session    Home Living Family/patient expects to be discharged to:: Private residence Living Arrangements: Alone Available Help at Discharge: Available PRN/intermittently;Family Type of Home: Apartment Home Access: Level entry     Home Layout: One level Home Equipment: Cane - single point;Grab bars - toilet;Grab bars - tub/shower;Shower seat;Walker - standard      Prior Function Level of Independence: Independent with assistive device(s)         Comments: community ambulator with SPC PRN, drives     Hand Dominance   Dominant Hand: Right    Extremity/Trunk Assessment   Upper Extremity Assessment Upper Extremity Assessment: Generalized  weakness    Lower Extremity Assessment Lower Extremity Assessment: Generalized weakness    Cervical / Trunk Assessment Cervical / Trunk Assessment: Normal  Communication    Communication: No difficulties  Cognition Arousal/Alertness: Awake/alert Behavior During Therapy: WFL for tasks assessed/performed Overall Cognitive Status: Within Functional Limits for tasks assessed                                        General Comments      Exercises     Assessment/Plan    PT Assessment Patient needs continued PT services  PT Problem List Decreased strength;Decreased activity tolerance;Decreased balance;Decreased mobility       PT Treatment Interventions Gait training;Stair training;Functional mobility training;Therapeutic activities;Therapeutic exercise;Patient/family education    PT Goals (Current goals can be found in the Care Plan section)  Acute Rehab PT Goals Patient Stated Goal: return home after rehab PT Goal Formulation: With patient Time For Goal Achievement: 02/19/18 Potential to Achieve Goals: Good    Frequency Min 3X/week   Barriers to discharge        Co-evaluation               AM-PAC PT "6 Clicks" Daily Activity  Outcome Measure Difficulty turning over in bed (including adjusting bedclothes, sheets and blankets)?: None Difficulty moving from lying on back to sitting on the side of the bed? : None Difficulty sitting down on and standing up from a chair with arms (e.g., wheelchair, bedside commode, etc,.)?: A Little Help needed moving to and from a bed to chair (including a wheelchair)?: A Little Help needed walking in hospital room?: A Lot Help needed climbing 3-5 steps with a railing? : A Lot 6 Click Score: 18    End of Session Equipment Utilized During Treatment: Gait belt Activity Tolerance: Patient tolerated treatment well;Patient limited by fatigue Patient left: in chair;with call bell/phone within reach Nurse Communication: Mobility status PT Visit Diagnosis: Unsteadiness on feet (R26.81);Other abnormalities of gait and mobility (R26.89);Muscle weakness (generalized) (M62.81)    Time: 1497-0263 PT  Time Calculation (min) (ACUTE ONLY): 36 min   Charges:   PT Evaluation $PT Eval Moderate Complexity: 1 Mod PT Treatments $Therapeutic Activity: 23-37 mins        12:32 PM, 02/05/18 Lonell Grandchild, MPT Physical Therapist with [facility] [phone] office [phone] mobile phone

## 2018-02-05 NOTE — Progress Notes (Signed)
ANTICOAGULATION CONSULT NOTE - Follow up [facility] for Heparin >> Lovenox>> Coumadin Indication: atrial fibrillation  No Known Allergies  Patient Measurements: Height: 6\' 4"  (193 cm) Weight: 188 lb 11.4 oz (85.6 kg) IBW/kg (Calculated) : 86.8 HEPARIN DW (KG): 85  Vital Signs: Temp: 96.9 F (36.1 C) (10/25 0742) Temp Source: Rectal (10/25 0742) BP: 167/112 (10/25 0600) Pulse Rate: 101 (10/25 0600)  Labs: Recent Labs    02/02/18 1535 02/02/18 1904  02/03/18 0355 02/04/18 0409 02/04/18 1717 02/05/18 0409  HGB  --   --    < > 9.6* 9.6*  --  9.3*  HCT  --   --   --  30.3* 30.8*  --  29.7*  PLT  --   --   --  339 270  --  320  LABPROT  --   --   --   --   --  14.9 16.8*  INR  --   --   --   --   --  1.18 1.38  HEPARINUNFRC <0.10* <0.10*  --   --   --   --   --   CREATININE  --   --   --  4.68*  --   --  4.27*   < > = values in this interval not displayed.    Estimated Creatinine Clearance: 22 mL/min (A) (by C-G formula based on SCr of 4.27 mg/dL (H)).  Assessment: 62 y.o. male, with history of anxiety, osteoarthritis, atrial fibrillation on chronic anticoagulation with Coumadin, CAD, chronic back pain, COPD, ESRD who was previously on hemodialysis, nephrology had stopped dialysis as patient had been noncompliant and started making urine as per note from previous hospitalization from 01/29/2018, dialysis catheter was removed.  Patient recently was admitted with sepsis due to paraspinal abscess status post drainage, lumbar discitis.  He was discharged home on ciprofloxacin and Augmentin for 6 weeks of therapy.  At that time he also had a TEE which was negative for vegetation. Patient has been on Coumadin for afib.   Transitioned from Heparin to SQ Lovenox. Now will add coumadin back since extubated and bridge until INR > 2  INR 1.38     Goal of Therapy:  INR 2-3 Monitor platelets by anticoagulation protocol: Yes   Plan:  Lovenox 1mg /kg SQ every 24 hours  (adjusted to renal function) Coumadin 5mg  po x 1 today PT-INR daily Continue to monitor H&H and platelets  Thanks for allowing pharmacy to be a part of this patient's care.  Margot Ables, PharmD Clinical Pharmacist 02/05/2018 9:20 AM

## 2018-02-05 NOTE — Progress Notes (Signed)
Subjective: Interval History:   Patient presently offers no complaints.  He denies any chest pain.  He had episode of difficulty in breathing but he states that he is feeling much better now.  Objective: Vital signs in last 24 hours: Temp:  [96.9 F (36.1 C)-98.6 F (37 C)] 96.9 F (36.1 C) (10/25 0742) Pulse Rate:  [62-121] 101 (10/25 0600) Resp:  [8-22] 16 (10/25 0600) BP: (137-178)/(90-124) 167/112 (10/25 0600) SpO2:  [80 %-100 %] 93 % (10/25 0841) FiO2 (%):  [30 %] 30 % (10/25 0319) Weight:  [85.6 kg] 85.6 kg (10/25 0500) Weight change:   Intake/Output from previous day: 10/24 0701 - 10/25 0700 In: 1219.8 [P.O.:340; I.V.:829.8; IV Piggyback:50] Out: 2976 [Urine:2975; Stool:1] Intake/Output this shift: No intake/output data recorded.  Patient is alert and in no apparent distress. Chest: Clear to auscultation, no wheezing. Heart exam revealed iregular rate and rhythm no murmur Abdomen: Soft positive bowel sounds Extremities no edema  Lab Results: Recent Labs    02/04/18 0409 02/05/18 0409  WBC 9.7 8.8  HGB 9.6* 9.3*  HCT 30.8* 29.7*  PLT 270 320   BMET:  Recent Labs    02/03/18 0355 02/05/18 0409  NA 139 138  K 3.6 3.9  CL 109 97*  CO2 21* 27  GLUCOSE 91 124*  BUN 44* 39*  CREATININE 4.68* 4.27*  CALCIUM 8.4* 8.4*   No results for input([name]): PTH in the last 72 hours. Iron Studies:  Recent Labs    02/02/18 1916  IRON 18*  TIBC 148*  FERRITIN 182    Studies/Results: US Venous Img Upper Uni Right  Result Date: 02/03/2018 CLINICAL DATA:  Right upper extremity pain and edema. History of recent dialysis catheter removal. Evaluate for DVT. EXAM: RIGHT UPPER EXTREMITY VENOUS DOPPLER ULTRASOUND TECHNIQUE: Gray-scale sonography with graded compression, as well as color Doppler and duplex ultrasound were performed to evaluate the upper extremity deep venous system from the level of the subclavian vein and including the jugular, axillary, basilic, radial,  ulnar and upper cephalic vein. Spectral Doppler was utilized to evaluate flow at rest and with distal augmentation maneuvers. COMPARISON:  None. FINDINGS: Contralateral Subclavian Vein: Respiratory phasicity is normal and symmetric with the symptomatic side. No evidence of thrombus. Normal compressibility. Internal Jugular Vein: No evidence of thrombus. Normal compressibility, respiratory phasicity and response to augmentation. Subclavian Vein: No evidence of thrombus. Normal compressibility, respiratory phasicity and response to augmentation. Axillary Vein: No evidence of thrombus. Normal compressibility, respiratory phasicity and response to augmentation. Cephalic Vein: No evidence of thrombus. Normal compressibility, respiratory phasicity and response to augmentation. Basilic Vein: No evidence of thrombus. Normal compressibility, respiratory phasicity and response to augmentation. Brachial Veins: No evidence of thrombus. Normal compressibility, respiratory phasicity and response to augmentation. Radial Veins: No evidence of thrombus. Normal compressibility, respiratory phasicity and response to augmentation. Ulnar Veins: No evidence of thrombus. Normal compressibility, respiratory phasicity and response to augmentation. Venous Reflux:  None visualized. Other Findings:  None visualized. IMPRESSION: No evidence of DVT within the right upper extremity. Electronically Signed   By: Sandi Mariscal M.D.   On: 02/03/2018 15:27    I have reviewed the patient'[name] current medications.  Assessment/Plan: 1] acute on chronic renal failure: His renal function progressively improving.  He does not have any uremic signs and symptoms. 2] altered mental status: Has improved.  His mental status seems to have returned to his baseline. 3] respiratory failure: Patient is extubated.  Patient has episode of difficulty breathing this morning.  He denies any cough or sputum production.  Patient on Lasix and he has 2900 cc of urine  output. 4] bone and mineral disorder: Calcium and phosphorus is range 5] anemia: His hemoglobin is low but stable.  His ferritin is 12% and ferritin of 182.  Possibly a combination of iron deficiency anemia and anemia of chronic disease. 6] hypertension: His blood pressure is reasonably controlled 7] history of paroxysmal atrial tachycardia: His heart rate is controlled 8] low CO2: Possibly metabolic.  He is off of sodium bicarb.  His CO2 is 27 has corrected. Plan: 1] we will decrease IV fluid to 75 cc/h 2] continue with Lasix 3] we will check his renal panel and CBC in the morning. .   LOS: 5 days   Aaron [name] 02/05/2018,9:03 AM

## 2018-02-05 NOTE — Progress Notes (Signed)
PROGRESS NOTE  Aaron [name] KCL:[id number] DOB: [birth year] DOA: 01/31/2018 PCP: Gwenlyn Saran [place]  Brief History: 62 year old with a history of anxiety, osteoarthritis, atrial fibrillation, CAD, chronic back pain,lumbar discitis and paraspinal abscessCOPD, ESRDpreviouslyon hemodialysis, GERD, hepatitis C, essential hypertension, PAD, medical noncompliance came to the ERwhen he was found by his family unconscious on the floor covered in his own stool and urine. The patient apparently had agonal respirations with oxygen saturation 80% range. He was intubated for airway protection. The patient was recently admitted to the hospital from 01/26/2018 through 01/29/2018 during which the patient refused dialysis and was subsequently taken off of dialysis by nephrology. His dialysis catheter was removed at that time. The patient has had a long history of noncompliance including leaving AMA from the hospitalization 01/09/2018 through 01/13/2018.The patient was most recently discharged on Augmentin and ciprofloxacin for his paraspinal abscess.  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 Controlled Substance Reporting System has been queried for this patient--the patient received Percocet 10 mg #120 on 12/29/2017 and subsequently an additional 64 Percocet filled since that time -pt mental status now back to baseline -UDS--positive opiates  CKD stage V -Nephrology following -Patient has refused dialysis;he was poorly compliant not going to his outpatient dialysis center -bicarbonate drip per nephrology--started  02/02/2018-->stopped 02/04/18 -IVF per renal -Left arm AV fistula ligated secondary to bleeding 12/2017 -Continue furosemide per nephrology  Lumbar discitis and paraspinal abscess -01/10/2018 drainage--bacillus species -Was previously on vancomycin and ceftazidime when he was on dialysis -Continue vancomycin and Zosyn for now  COPD Exacerbation -Continue duonebs -continue pulmicort -short course prednisone  Elevated troponin -Secondary to ESRD -Trend is flat -No chest pain presently -Personally reviewed EKG--atrial fibrillation, nonspecific T wave changes  Chronic atrial fibrillationwith RVR -increase diltiazem 360 mg daily -01/12/2018 TEE--EF 50-55%, no vegetation, mild MR, HK anterior septal -INR was subtherapeutic at the time of admission -was initially on IV heparin/[place] enoxaparin earlier in the admission -restart warfarin  Essential hypertension -Previously on carvedilol prior to admission -Continue IV Lopressor for now>>>transition to po     Disposition Plan: SNF in 2-3days  Family Communication: No family present   Consultants:nephrology  Code Status: FULL   DVT Prophylaxis: [place] Lovenox   Procedures: As Listed in Progress Note Above  Antibiotics: vanco 10/21>>> Zosyn 10/21>>>   Subjective: Patient denies fevers, chills, headache, chest pain, dyspnea, nausea, vomiting, diarrhea, abdominal pain, dysuria, hematuria, hematochezia, and melena.   Objective: Vitals:   02/05/18 1000 02/05/18 1100 02/05/18 1316 02/05/18 1322  BP:   (!) 169/102   Pulse: (!) 112 88    Resp:      Temp:      TempSrc:      SpO2: 94% 93%  95%  Weight:      Height:        Intake/Output Summary (Last 24 hours) at 02/05/2018 1548 Last data filed at 02/05/2018 0500 Gross per 24 hour  Intake 340 ml  Output 2976 ml  Net -2636 ml   Weight change:  Exam:   General:  Pt is alert, follows commands appropriately, not in acute distress  HEENT: No  icterus, No thrush, No neck mass, [place]/AT  Cardiovascular: RRR,  S1/S2, no rubs, no gallops  Respiratory: fine bibasilar crackles, no wheeze  Abdomen: Soft/+BS, non tender, non distended, no guarding  Extremities: No edema, No lymphangitis, No petechiae, No rashes, no synovitis   Data Reviewed: I have personally reviewed following labs and imaging studies Basic Metabolic Panel: Recent Labs  Lab 01/31/18 2258 02/01/18 0513 02/02/18 0457 02/03/18 0355 02/05/18 0409  NA 138 138 139 139 138  K 4.4 5.0 3.6 3.6 3.9  CL 108 109 113* 109 97*  CO2 20* 18* 14* 21* 27  GLUCOSE 114* 95 85 91 124*  BUN 46* 48* 46* 44* 39*  CREATININE 4.61* 4.64* 4.53* 4.68* 4.27*  CALCIUM 8.4* 8.5* 8.3* 8.4* 8.4*  PHOS  --   --  3.8 5.1* 5.1*   Liver Function Tests: Recent Labs  Lab 01/31/18 1511 02/01/18 0513 02/02/18 0457 02/03/18 0355 02/05/18 0409  AST 21 20  --   --   --   ALT 9 9  --   --   --   ALKPHOS 116 84  --   --   --   BILITOT 0.8 0.8  --   --   --   PROT 7.6 6.2*  --   --   --   ALBUMIN 3.1* 2.5* 2.3* 2.7* 2.8*   No results for input(s): LIPASE, AMYLASE in the last 168 hours. No results for input(s): AMMONIA in the last 168 hours. Coagulation Profile: Recent Labs  Lab 01/31/18 1511 02/04/18 1717 02/05/18 0409  INR 1.37 1.18 1.38   CBC: Recent Labs  Lab 01/31/18 1511 01/31/18 1742 02/02/18 0457 02/03/18 0355 02/04/18 0409 02/05/18 0409  WBC 17.1*  --  6.1 10.2 9.7 8.8  NEUTROABS 15.5*  --   --   --   --   --   HGB 10.3* 11.2* 7.9* 9.6* 9.6* 9.3*  HCT 33.0* 33.0* 25.0* 30.3* 30.8* 29.7*  MCV 96.5  --  94.0 96.5 97.5 94.3  PLT 340  --  259 339 270 320   Cardiac Enzymes: Recent Labs  Lab 01/31/18 1711 01/31/18 2258 02/01/18 0513 02/01/18 1120 02/01/18 1517  CKTOTAL 139  --   --   --   --   TROPONINI  --  0.03* 0.04* 0.03* 0.03*   BNP: Invalid input(s): POCBNP CBG: Recent Labs  Lab 02/01/18 2354 02/02/18 0405 02/02/18 0723 02/02/18 1132  02/03/18 0752  GLUCAP 77 84 85 91 98   HbA1C: No results for input(s): HGBA1C in the last 72 hours. Urine analysis:    Component Value Date/Time   COLORURINE YELLOW 01/31/2018 1726   APPEARANCEUR CLEAR 01/31/2018 1726   LABSPEC 1.012 01/31/2018 1726   PHURINE 5.0 01/31/2018 1726   GLUCOSEU NEGATIVE 01/31/2018 1726   HGBUR SMALL (A) 01/31/2018 1726   BILIRUBINUR NEGATIVE 01/31/2018 1726   KETONESUR NEGATIVE 01/31/2018 1726   PROTEINUR 30 (A) 01/31/2018 1726   UROBILINOGEN 0.2 03/03/2010 1056   NITRITE NEGATIVE 01/31/2018 1726   LEUKOCYTESUR NEGATIVE 01/31/2018 1726   Sepsis Labs: @LABRCNTIP (procalcitonin:4,lacticidven:4) ) Recent Results (from the past 240 hour(s))  Culture, blood (routine x 2)     Status: None   Collection Time: 01/26/18 10:47 PM  Result Value Ref Range Status   Specimen Description BLOOD RIGHT ARM  Final   Special Requests   Final    BOTTLES DRAWN AEROBIC AND ANAEROBIC ANA BCAV AEB [place]   Culture   Final    NO GROWTH 5 DAYS Performed at [facility], [address], [place], [place] [address]  Report Status 01/31/2018 FINAL  Final  Culture, blood (routine x 2)     Status: None   Collection Time: 01/26/18 10:47 PM  Result Value Ref Range Status   Specimen Description BLOOD RIGHT ARM  Final   Special Requests   Final    BOTTLES DRAWN AEROBIC AND ANAEROBIC ANA BCAV AEB [place]   Culture   Final    NO GROWTH 5 DAYS Performed at [facility], [address], [place], [place] [address]    Report Status 01/31/2018 FINAL  Final  Urine culture     Status: None   Collection Time: 01/27/18  6:35 PM  Result Value Ref Range Status   Specimen Description   Final    URINE, CLEAN CATCH Performed at [facility], [address] [place], [place] [address]    Special Requests   Final    Normal Performed at [facility], [address] [place], [place] [address]    Culture   Final    NO GROWTH Performed at [facility], [place] [address], [place],  [place] [address]    Report Status 01/28/2018 FINAL  Final  Urine Culture     Status: None   Collection Time: 01/31/18  5:26 PM  Result Value Ref Range Status   Specimen Description   Final    URINE, CATHETERIZED Performed at [facility], [address] [place], [place] [address]    Special Requests   Final    NONE Performed at [facility], [address], [place], [place] [address]    Culture   Final    NO GROWTH Performed at [facility], [place] [address], [place], [place] [address]    Report Status 02/02/2018 FINAL  Final  Culture, blood (Routine X 2) w Reflex to ID Panel     Status: None   Collection Time: 01/31/18  7:05 PM  Result Value Ref Range Status   Specimen Description   Final    BLOOD RIGHT HAND BOTTLES DRAWN AEROBIC ONLY Blood Culture adequate volume   Special Requests NONE  Final   Culture   Final    NO GROWTH 5 DAYS Performed at [facility], [address], [place], [place] [address]    Report Status 02/05/2018 FINAL  Final  Culture, blood (Routine X 2) w Reflex to ID Panel     Status: None   Collection Time: 01/31/18  7:10 PM  Result Value Ref Range Status   Specimen Description   Final    BLOOD LEFT HAND Blood Culture adequate volume BOTTLES DRAWN AEROBIC ONLY   Special Requests NONE  Final   Culture   Final    NO GROWTH 5 DAYS Performed at [facility], [address] [place], [place] [address]    Report Status 02/05/2018 FINAL  Final     Scheduled Meds: . budesonide (PULMICORT) nebulizer solution  0.5 mg Nebulization BID  . diltiazem  360 mg Oral Daily  . diltiazem  60 mg Oral Q6H  . enoxaparin (LOVENOX) injection  1 mg/kg Subcutaneous Q24H  . furosemide  40 mg Intravenous BID  . ipratropium-albuterol  3 mL Nebulization Q6H  . metoprolol tartrate  50 mg Oral BID  . pantoprazole  40 mg Oral Daily  . predniSONE  60 mg Oral Q breakfast  . warfarin  5 mg Oral Once  . Warfarin - Pharmacist Dosing Inpatient   Does not apply q1800   Continuous  Infusions: . sodium chloride Stopped (02/04/18 0555)  .  sodium chloride 75 mL/hr at 02/05/18 0941  . piperacillin-tazobactam (ZOSYN)  IV 3.375 g (02/05/18 0941)  . vancomycin 1,000 mg (02/04/18 2141)    Procedures/Studies: Dg Chest 2 View  Result Date: 01/26/2018 CLINICAL DATA:  Shortness of breath EXAM: CHEST - 2 VIEW COMPARISON:  01/09/2018, 12/04/2017 FINDINGS: Right-sided central venous catheter tip over the SVC. Bibasilar atelectasis or scarring. No pleural effusion. No focal consolidation. Stable cardiomediastinal silhouette with aortic atherosclerosis. No pneumothorax. IMPRESSION: No active cardiopulmonary disease. Stable scarring and/or atelectasis at both lung bases. Electronically Signed   By: Donavan Foil M.D.   On: 01/26/2018 23:23   Dg Chest 2 View  Result Date: 01/09/2018 CLINICAL DATA:  Short of breath EXAM: CHEST - 2 VIEW COMPARISON:  12/04/2017 FINDINGS: Right-sided central venous catheter tip over the SVC. Bilateral hyperinflation. Non inclusion of the CP angles on the lateral view. Streaky atelectasis or scarring at the bases. Stable borderline cardiomegaly. No pneumothorax. IMPRESSION: 1. Non inclusion of the lung bases on lateral view. 2. Streaky atelectasis or scarring at the bases. Electronically Signed   By: Donavan Foil M.D.   On: 01/09/2018 16:16   Dg Lumbar Spine 2-3 Views  Result Date: 01/28/2018 CLINICAL DATA:  Discitis EXAM: LUMBAR SPINE - 2-3 VIEW COMPARISON:  MRI from 01/06/2018 FINDINGS: Five lumbar type vertebral bodies are well visualized. Vertebral body height is well maintained at L1, L3-L4 and L5. Interbody fusion at L2-3 and L3-4 is seen. Increase kyphosis at L1-2 is noted related to interval superior endplate collapse at L2. MRI may be helpful for further evaluation. Multilevel osteophytic changes are noted. Diffuse vascular calcifications with right iliac stenting are noted. IMPRESSION: Progressive kyphosis at L1-2 related to superior endplate  compression of L2. MRI may be helpful for further evaluation. Electronically Signed   By: Inez Catalina M.D.   On: 01/28/2018 14:12   Ct Head Wo Contrast  Result Date: 01/31/2018 CLINICAL DATA:  Found unresponsive EXAM: CT HEAD WITHOUT CONTRAST CT CERVICAL SPINE WITHOUT CONTRAST TECHNIQUE: Multidetector CT imaging of the head and cervical spine was performed following the standard protocol without intravenous contrast. Multiplanar CT image reconstructions of the cervical spine were also generated. COMPARISON:  12/25/2017 FINDINGS: CT HEAD FINDINGS Brain: There is atrophy and chronic small vessel disease changes. No acute intracranial abnormality. Specifically, no hemorrhage, hydrocephalus, mass lesion, acute infarction, or significant intracranial injury. Vascular: No hyperdense vessel or unexpected calcification. Skull: No acute calvarial abnormality. Sinuses/Orbits: Diffuse mucosal thickening throughout the ethmoid air cells and right sphenoid sinus. Mastoid air cells clear. No orbital soft tissue abnormality. Other: None CT CERVICAL SPINE FINDINGS Alignment: No subluxation Skull base and vertebrae: No acute fracture. No primary bone lesion or focal pathologic process. Soft tissues and spinal canal: No prevertebral fluid or swelling. No visible canal hematoma. Disc levels: Prior anterior fusion from C3-C5. Diffuse degenerative disc and facet disease. Upper chest: Emphysematous changes in the apices. No acute findings. Other: Carotid artery calcifications bilaterally. IMPRESSION: No acute intracranial abnormality. Atrophy, chronic microvascular disease. Prior anterior fusion from C3-C5. Diffuse degenerative disc and facet disease in the cervical spine. No acute bony abnormality. Electronically Signed   By: Rolm Baptise M.D.   On: 01/31/2018 19:42   Ct Cervical Spine Wo Contrast  Result Date: 01/31/2018 CLINICAL DATA:  Found unresponsive EXAM: CT HEAD WITHOUT CONTRAST CT CERVICAL SPINE WITHOUT CONTRAST  TECHNIQUE: Multidetector CT imaging of the head and cervical spine was performed following the standard protocol without intravenous contrast. Multiplanar CT image reconstructions of the  cervical spine were also generated. COMPARISON:  12/25/2017 FINDINGS: CT HEAD FINDINGS Brain: There is atrophy and chronic small vessel disease changes. No acute intracranial abnormality. Specifically, no hemorrhage, hydrocephalus, mass lesion, acute infarction, or significant intracranial injury. Vascular: No hyperdense vessel or unexpected calcification. Skull: No acute calvarial abnormality. Sinuses/Orbits: Diffuse mucosal thickening throughout the ethmoid air cells and right sphenoid sinus. Mastoid air cells clear. No orbital soft tissue abnormality. Other: None CT CERVICAL SPINE FINDINGS Alignment: No subluxation Skull base and vertebrae: No acute fracture. No primary bone lesion or focal pathologic process. Soft tissues and spinal canal: No prevertebral fluid or swelling. No visible canal hematoma. Disc levels: Prior anterior fusion from C3-C5. Diffuse degenerative disc and facet disease. Upper chest: Emphysematous changes in the apices. No acute findings. Other: Carotid artery calcifications bilaterally. IMPRESSION: No acute intracranial abnormality. Atrophy, chronic microvascular disease. Prior anterior fusion from C3-C5. Diffuse degenerative disc and facet disease in the cervical spine. No acute bony abnormality. Electronically Signed   By: Rolm Baptise M.D.   On: 01/31/2018 19:42   Ct Aspiration  Result Date: 01/10/2018 INDICATION: L1-2 and L4-5 discitis.  Left L2 psoas small abscess. EXAM: CT-GUIDED ASPIRATION MEDICATIONS: The patient is currently admitted to the hospital and receiving intravenous antibiotics. The antibiotics were administered within an appropriate time frame prior to the initiation of the procedure. ANESTHESIA/SEDATION: Fentanyl 50 mcg IV; Versed 1.5 mg IV Moderate Sedation Time:  10 minutes The  patient was continuously monitored during the procedure by the interventional radiology nurse under my direct supervision. COMPLICATIONS: None immediate. PROCEDURE: Informed written consent was obtained from the patient after a thorough discussion of the procedural risks, benefits and alternatives. All questions were addressed. Maximal Sterile Barrier Technique was utilized including caps, mask, sterile gowns, sterile gloves, sterile drape, hand hygiene and skin antiseptic. A timeout was performed prior to the initiation of the procedure. The back was prepped and draped in a sterile fashion. 1% lidocaine was utilized for local anesthesia. Under CT guidance, an 18 gauge needle was inserted into the left L2 paraspinal fluid collection. Aspiration yielded 2 drops bloody fluid. FINDINGS: Imaging demonstrates needle placement in the left L2 paraspinal fluid collection. IMPRESSION: Successful left paraspinal L2 aspiration yielding 2 drops bloody fluid. Electronically Signed   By: Marybelle Killings M.D.   On: 01/10/2018 16:00   US Venous Img Upper Uni Right  Result Date: 02/03/2018 CLINICAL DATA:  Right upper extremity pain and edema. History of recent dialysis catheter removal. Evaluate for DVT. EXAM: RIGHT UPPER EXTREMITY VENOUS DOPPLER ULTRASOUND TECHNIQUE: Gray-scale sonography with graded compression, as well as color Doppler and duplex ultrasound were performed to evaluate the upper extremity deep venous system from the level of the subclavian vein and including the jugular, axillary, basilic, radial, ulnar and upper cephalic vein. Spectral Doppler was utilized to evaluate flow at rest and with distal augmentation maneuvers. COMPARISON:  None. FINDINGS: Contralateral Subclavian Vein: Respiratory phasicity is normal and symmetric with the symptomatic side. No evidence of thrombus. Normal compressibility. Internal Jugular Vein: No evidence of thrombus. Normal compressibility, respiratory phasicity and response to  augmentation. Subclavian Vein: No evidence of thrombus. Normal compressibility, respiratory phasicity and response to augmentation. Axillary Vein: No evidence of thrombus. Normal compressibility, respiratory phasicity and response to augmentation. Cephalic Vein: No evidence of thrombus. Normal compressibility, respiratory phasicity and response to augmentation. Basilic Vein: No evidence of thrombus. Normal compressibility, respiratory phasicity and response to augmentation. Brachial Veins: No evidence of thrombus. Normal compressibility, respiratory phasicity and response to  augmentation. Radial Veins: No evidence of thrombus. Normal compressibility, respiratory phasicity and response to augmentation. Ulnar Veins: No evidence of thrombus. Normal compressibility, respiratory phasicity and response to augmentation. Venous Reflux:  None visualized. Other Findings:  None visualized. IMPRESSION: No evidence of DVT within the right upper extremity. Electronically Signed   By: Sandi Mariscal M.D.   On: 02/03/2018 15:27   Dg Chest Port 1 View  Result Date: 02/02/2018 CLINICAL DATA:  Bilateral inspiratory rales EXAM: PORTABLE CHEST 1 VIEW COMPARISON:  01/31/2018 FINDINGS: Endotracheal tube with the tip 7 cm above the carina. Nasogastric tube coursing below the diaphragm. Mild bilateral interstitial thickening. Possible trace left pleural effusion. No pneumothorax. Increased opacity in the left retrocardiac region likely related to technique. Stable cardiomediastinal silhouette. No acute osseous abnormality. IMPRESSION: 1. Endotracheal tube with the tip 7 cm above the carina. 2. Nasogastric tube coursing below the diaphragm. 3. Bilateral diffuse mild interstitial thickening which may reflect mild interstitial edema versus infection. Electronically Signed   By: Kathreen Devoid   On: 02/02/2018 10:19   Dg Chest Port 1 View  Result Date: 01/31/2018 CLINICAL DATA:  Intubated EXAM: PORTABLE CHEST 1 VIEW COMPARISON:  Chest  radiograph from earlier today. FINDINGS: Endotracheal tube tip is 14.0 cm above the carina. Enteric tube coils in cervical esophagus and enters the stomach with the tip not seen on this image. Stable cardiomediastinal silhouette with normal heart size. No pneumothorax. No pleural effusion. No pulmonary edema. Mild left basilar scarring versus atelectasis. No acute consolidative airspace disease. IMPRESSION: 1. Endotracheal tube tip 14.0 cm above the carina, recommend advancing. 2. Enteric tube coils in the cervical esophagus and enters the stomach with the tip not seen on this image. 3. Mild left basilar scarring versus atelectasis. These results were called by telephone at the time of interpretation on 01/31/2018 at 6:30 pm to Dr. Noemi Chapel , who verbally acknowledged these results. Electronically Signed   By: Ilona Sorrel M.D.   On: 01/31/2018 18:32   Dg Chest Port 1 View  Result Date: 01/31/2018 CLINICAL DATA:  Unconscious patient. EXAM: PORTABLE CHEST 1 VIEW COMPARISON:  01/26/2018 FINDINGS: Lower portions of the thorax are excluded by collimation. Interval removal of dialysis catheter. Mildly enlarged cardiac silhouette. Mediastinal contours appear intact. There is no evidence of focal airspace consolidation, pleural effusion or pneumothorax. Osseous structures are without acute abnormality. Soft tissues are grossly normal. IMPRESSION: Mildly enlarged cardiac silhouette. Otherwise no acute abnormalities, accounting for excluding of the lower thorax. Electronically Signed   By: Fidela Salisbury M.D.   On: 01/31/2018 17:49    Orson Eva, DO  [facility] Pager [phone]  If 7PM-7AM, please contact night-coverage [email or web address] Password [email or web address] 02/05/2018, 3:48 PM   LOS: 5 days

## 2018-02-05 NOTE — Plan of Care (Signed)
  Problem: Acute Rehab PT Goals(only PT should resolve) Goal: Pt Will Go Supine/Side To Sit Outcome: Progressing Flowsheets (Taken 02/05/2018 1235) Pt will go Supine/Side to Sit: with modified independence Goal: Patient Will Transfer Sit To/From Stand Outcome: Progressing Flowsheets (Taken 02/05/2018 1235) Patient will transfer sit to/from stand: with min guard assist Goal: Pt Will Transfer Bed To Chair/Chair To Bed Outcome: Progressing Flowsheets (Taken 02/05/2018 1235) Pt will Transfer Bed to Chair/Chair to Bed: min guard assist Goal: Pt Will Ambulate Outcome: Progressing Flowsheets (Taken 02/05/2018 1235) Pt will Ambulate: 50 feet; with rolling walker; with min guard assist   12:35 PM, 02/05/18 Lonell Grandchild, MPT Physical Therapist with [facility] [phone] office [phone] mobile phone

## 2018-02-05 NOTE — NC FL2 (Signed)
[place] LEVEL OF CARE SCREENING TOOL     IDENTIFICATION  Patient Name: Aaron [name] Birthdate: 07-24-1955 Sex: male Admission Date (Current Location): 01/31/2018  [facility] and [place] Number:  [employer] and Address:  [facility] [address] [place]      Provider Number: [id number]  Attending Physician Name and Address:  Orson Eva, MD  Relative Name and Phone Number:  Irish Lack (friend) [phone]    Current Level of Care: Hospital Recommended Level of Care: [place] Prior Approval Number:    Date Approved/Denied:   PASRR Number:    Discharge Plan: SNF    Current Diagnoses: Patient Active Problem List   Diagnosis Date Noted  . CKD (chronic kidney disease) stage 5, GFR less than 15 ml/min (HCC) 02/03/2018  . COPD with acute exacerbation ([place]) 02/03/2018  . Atrial fibrillation, chronic 02/03/2018  . Acute encephalopathy   . Acute respiratory failure ([place]) 01/31/2018  . Fever   . Sepsis due to undetermined organism ([place]) 01/27/2018  . Discitis 01/27/2018  . Discitis of lumbar region 01/09/2018  . Paraspinal abscess ([place]) 01/08/2018  . ESRD on dialysis ([place]) 12/30/2017  . Bacteremia 12/07/2017  . Hematoma of arm, right, initial encounter 11/23/2017  . Protein-calorie malnutrition, severe 10/15/2017  . Hyperlipidemia 08/24/2017  . Schatzki's ring of distal esophagus 04/27/2017  . Encounter for therapeutic drug monitoring 03/03/2017  . History of hepatitis C 02/20/2017  . Ischemic cardiomyopathy 01/27/2017  . Dyslipidemia 01/27/2017  . AF (paroxysmal atrial fibrillation) ([place]) 01/27/2017  . CAD (coronary artery disease) 01/21/2017  . Hepatitis C 12/15/2016  . Polycystic kidney disease 12/14/2016  . COPD (chronic obstructive pulmonary disease) ([place])   . COLD (chronic obstructive lung disease) ([place])   . Depression   . PAD (peripheral artery disease) ([place]) 09/19/2014  . Preop  cardiovascular exam 10/28/2010  . Tobacco abuse 10/28/2010  . UNSPECIFIED IRON DEFICIENCY ANEMIA 10/04/2009  . 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  . Essential hypertension 10/04/2009  . Esophageal reflux 10/04/2009  . PRECORDIAL PAIN 10/04/2009    Orientation RESPIRATION BLADDER Height & Weight     Self, Time, Situation, Place  Normal Continent Weight: 188 lb 11.4 oz (85.6 kg) Height:  6\' 4"  (193 cm)  BEHAVIORAL SYMPTOMS/MOOD NEUROLOGICAL BOWEL NUTRITION STATUS      Continent Diet(see dc summary)  AMBULATORY STATUS COMMUNICATION OF NEEDS Skin   Extensive Assist Verbally Normal                       Personal Care Assistance Level of Assistance  Bathing, Feeding, Dressing Bathing Assistance: Limited assistance Feeding assistance: Independent Dressing Assistance: Limited assistance     Functional Limitations Info  Sight, Hearing, Speech Sight Info: Adequate Hearing Info: Adequate Speech Info: Adequate    SPECIAL CARE FACTORS FREQUENCY  PT (By licensed PT)     PT Frequency: 5 times week              Contractures Contractures Info: Not present    Additional Factors Info  Code Status, Allergies Code Status Info: full Allergies Info: NKA           Current Medications (02/05/2018):  This is the current hospital active medication list Current Facility-Administered Medications  Medication Dose Route Frequency Provider Last Rate Last Dose  . 0.9 %  sodium chloride infusion   Intravenous PRN Orson Eva, MD   Stopped at 02/04/18 0555  . 0.9 %  sodium chloride  infusion   Intravenous Continuous Fran Lowes, MD 75 mL/hr at 02/05/18 0941    . acetaminophen (TYLENOL) tablet 650 mg  650 mg Oral Q6H PRN Oswald [place], MD       Or  . acetaminophen (TYLENOL) suppository 650 mg  650 mg Rectal Q6H PRN Oswald [place], MD      . budesonide (PULMICORT) nebulizer solution 0.5 mg  0.5 mg Nebulization BID Tat, Shanon Brow, MD   0.5 mg at 02/05/18 0841  . diltiazem (CARDIZEM) tablet  60 mg  60 mg Oral Q6H Orson Eva, MD   60 mg at 02/05/18 0521  . diphenhydrAMINE (BENADRYL) capsule 25 mg  25 mg Oral QHS PRN Lovey Newcomer T, NP   25 mg at 02/04/18 2141  . enoxaparin (LOVENOX) injection 80 mg  1 mg/kg Subcutaneous Q24H Barton Dubois, MD   80 mg at 02/04/18 2142  . furosemide (LASIX) injection 40 mg  40 mg Intravenous BID Fran Lowes, MD   40 mg at 02/05/18 0939  . hydrALAZINE (APRESOLINE) injection 10 mg  10 mg Intravenous Q6H PRN Barton Dubois, MD   10 mg at 02/04/18 2025  . HYDROcodone-acetaminophen (NORCO/VICODIN) 5-325 MG per tablet 1-2 tablet  1-2 tablet Oral Q6H PRN Schorr, Rhetta Mura, NP   2 tablet at 02/03/18 0448  . ipratropium-albuterol (DUONEB) 0.5-2.5 (3) MG/3ML nebulizer solution 3 mL  3 mL Nebulization Q6H Orson Eva, MD   3 mL at 02/05/18 0841  . metoprolol tartrate (LOPRESSOR) tablet 50 mg  50 mg Oral BID Orson Eva, MD   50 mg at 02/05/18 0939  . ondansetron (ZOFRAN) tablet 4 mg  4 mg Oral Q6H PRN Oswald [place]ock, MD       Or  . ondansetron (ZOFRAN) injection 4 mg  4 mg Intravenous Q6H PRN Oswald [place], MD      . pantoprazole (PROTONIX) EC tablet 40 mg  40 mg Oral Daily Barton Dubois, MD   40 mg at 02/05/18 0939  . piperacillin-tazobactam (ZOSYN) IVPB 3.375 g  3.375 g Intravenous Q12H Barton Dubois, MD 12.5 mL/hr at 02/05/18 0941 3.375 g at 02/05/18 0941  . predniSONE (DELTASONE) tablet 60 mg  60 mg Oral Q breakfast Tat, Shanon Brow, MD   60 mg at 02/05/18 0939  . vancomycin (VANCOCIN) IVPB 1000 mg/200 mL premix  1,000 mg Intravenous Q48H Barton Dubois, MD 200 mL/hr at 02/04/18 2141 1,000 mg at 02/04/18 2141  . warfarin (COUMADIN) tablet 5 mg  5 mg Oral Once Tat, Shanon Brow, MD      . Warfarin - Pharmacist Dosing Inpatient   Does not apply q1800 Tat, Shanon Brow, MD         Discharge Medications: Please see discharge summary for a list of discharge medications.  Relevant Imaging Results:  Relevant Lab Results:   Additional Information SSN: [place], LCSW

## 2018-02-06 LAB — RENAL FUNCTION PANEL
ALBUMIN: 3 g/dL — AB (ref 3.5–5.0)
Anion gap: 14 (ref 5–15)
BUN: 45 mg/dL — AB (ref 8–23)
CALCIUM: 8.5 mg/dL — AB (ref 8.9–10.3)
CO2: 26 mmol/L (ref 22–32)
CREATININE: 4.3 mg/dL — AB (ref 0.61–1.24)
Chloride: 98 mmol/L (ref 98–111)
GFR calc Af Amer: 16 mL/min — ABNORMAL LOW (ref >60)
GFR calc non Af Amer: 14 mL/min — ABNORMAL LOW (ref >60)
GLUCOSE: 155 mg/dL — AB (ref 70–99)
Phosphorus: 5 mg/dL — ABNORMAL HIGH (ref 2.5–4.6)
Potassium: 3.5 mmol/L (ref 3.5–5.1)
Sodium: 138 mmol/L (ref 135–145)

## 2018-02-06 LAB — CBC
HCT: 31.9 % — ABNORMAL LOW (ref 39.0–52.0)
Hemoglobin: 9.7 g/dL — ABNORMAL LOW (ref 13.0–17.0)
MCH: 29 pg (ref 26.0–34.0)
MCHC: 30.4 g/dL (ref 30.0–36.0)
MCV: 95.2 fL (ref 80.0–100.0)
PLATELETS: 339 10*3/uL (ref 150–400)
RBC: 3.35 MIL/uL — AB (ref 4.22–5.81)
RDW: 18.5 % — AB (ref 11.5–15.5)
WBC: 9.4 10*3/uL (ref 4.0–10.5)
nRBC: 0 % (ref 0.0–0.2)

## 2018-02-06 LAB — PROTIME-INR
INR: 1.3
Prothrombin Time: 16 seconds — ABNORMAL HIGH (ref 11.4–15.2)

## 2018-02-06 LAB — VANCOMYCIN, TROUGH: Vancomycin Tr: 27 ug/mL (ref 15–20)

## 2018-02-06 MED ORDER — NICOTINE 21 MG/24HR TD PT24
21.0000 mg | MEDICATED_PATCH | Freq: Every day | TRANSDERMAL | Status: DC
  Administered 2018-02-06 – 2018-02-08 (×3): 21 mg via TRANSDERMAL
  Filled 2018-02-06 (×3): qty 1

## 2018-02-06 MED ORDER — FUROSEMIDE 40 MG PO TABS
40.0000 mg | ORAL_TABLET | Freq: Every day | ORAL | Status: DC
  Administered 2018-02-06 – 2018-02-08 (×3): 40 mg via ORAL
  Filled 2018-02-06 (×3): qty 1

## 2018-02-06 MED ORDER — IPRATROPIUM-ALBUTEROL 0.5-2.5 (3) MG/3ML IN SOLN
3.0000 mL | Freq: Four times a day (QID) | RESPIRATORY_TRACT | Status: DC
  Administered 2018-02-06 – 2018-02-08 (×7): 3 mL via RESPIRATORY_TRACT
  Filled 2018-02-06 (×7): qty 3

## 2018-02-06 MED ORDER — WARFARIN SODIUM 7.5 MG PO TABS
7.5000 mg | ORAL_TABLET | Freq: Once | ORAL | Status: AC
  Administered 2018-02-06: 7.5 mg via ORAL
  Filled 2018-02-06: qty 1

## 2018-02-06 NOTE — Progress Notes (Addendum)
PROGRESS NOTE  Aaron [name] RQS:[id number] DOB: 1956/04/11 DOA: 01/31/2018 PCP: Gwenlyn Saran [place]  Brief History: 62 year old with a history of anxiety, osteoarthritis, atrial fibrillation, CAD, chronic back pain,lumbar discitis and paraspinal abscessCOPD, ESRDpreviouslyon hemodialysis, GERD, hepatitis C, essential hypertension, PAD, medical noncompliance came to the ERwhen he was found by his family unconscious on the floor covered in his own stool and urine. The patient apparently had agonal respirations with oxygen saturation 80% range. He was intubated for airway protection. The patient was recently admitted to the hospital from 01/26/2018 through 01/29/2018 during which the patient refused dialysis and was subsequently taken off of dialysis by nephrology. His dialysis catheter was removed at that time. The patient has had a long history of noncompliance including leaving AMA from the hospitalization 01/09/2018 through 01/13/2018.The patient was most recently discharged on Augmentin and ciprofloxacin for his paraspinal abscess.  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 and hypnotics -Patient stated that he took some of his sister's "tranquilizers"and suboxone -Intubated in the emergency department -Extubated 02/02/2018 -weaned oxygen off   Acute metabolic and toxic encephalopathy -secondary to opioid overuse -The [facility] Controlled Substance Reporting System has been queried for this patient--the patient received Percocet 10 mg #120 on 12/29/2017 and subsequently an additional 55 Percocet filled since that time -pt mental status now back to baseline -UDS--positive opiates  CKD stage V -Nephrology following -Patient has refused dialysis;he was poorly compliant not going to his outpatient dialysis center -bicarbonate drip per nephrology--started  02/02/2018-->stopped 02/04/18 -IVF per renal -Left arm AV fistula ligated secondary to bleeding 12/2017 -Continue furosemide per nephrology  Lumbar discitis and paraspinal abscess -01/10/2018 drainage--bacillus species -Was previously on vancomycin and ceftazidime when he was on dialysis -Continue vancomycin and Zosyn for now -ESR and CRP in am  COPD Exacerbation -Continueduonebs -continuepulmicort -short course prednisone  Elevated troponin -Secondary to ESRD -Trend is flat -No chest pain presently -Personally reviewed EKG--atrial fibrillation, nonspecific T wave changes  Chronic atrial fibrillationwith RVR -increased diltiazem 360 mg daily -01/12/2018 TEE--EF 50-55%, no vegetation, mild MR, HK anterior septal -INR was subtherapeutic at the time of admission -was initially on IV heparin/[place] enoxaparin earlier in the admission -restart warfarin  Essential hypertension -Previously on carvedilol prior to admission -Continue IV Lopressor for now>>>transition to po     Disposition Plan: SNF 10/28 if stable Family Communication:spouse updated 10/26   Consultants:nephrology  Code Status: FULL   DVT Prophylaxis: [place] Lovenox   Procedures: As Listed in Progress Note Above  Antibiotics: vanco 10/21>>> Zosyn 10/21>>>     Subjective: Pt c/o some dyspnea on exertion.  Denies cp, f/c, n/v/d, abd pain, headache.  Objective: Vitals:   02/06/18 1000 02/06/18 1116 02/06/18 1309 02/06/18 1617  BP: (!) 155/116     Pulse: 100 95  (!) 104  Resp: 17 (!) 23  (!) 30  Temp:  98.3 F (36.8 C)  98.3 F (36.8 C)  TempSrc:  Oral  Oral  SpO2: 98% 96% 97% 94%  Weight:      Height:        Intake/Output Summary (Last 24 hours) at 02/06/2018 1718 Last data filed at 02/06/2018 1600 Gross per 24 hour  Intake 3491.75 ml  Output 2200 ml  Net 1291.75 ml   Weight change: -0.3 kg Exam:   General:  Pt is alert, follows commands appropriately, not in  acute distress  HEENT: No icterus, No thrush,  No neck mass,  [place]/AT  Cardiovascular: RRR, S1/S2, no rubs, no gallops  Respiratory: bibasilar rales.  Minimal basilar wheeze  Abdomen: Soft/+BS, non tender, non distended, no guarding  Extremities: trace LE edema, No lymphangitis, No petechiae, No rashes, no synovitis   Data Reviewed: I have personally reviewed following labs and imaging studies Basic Metabolic Panel: Recent Labs  Lab 02/01/18 0513 02/02/18 0457 02/03/18 0355 02/05/18 0409 02/06/18 0432  NA 138 139 139 138 138  K 5.0 3.6 3.6 3.9 3.5  CL 109 113* 109 97* 98  CO2 18* 14* 21* 27 26  GLUCOSE 95 85 91 124* 155*  BUN 48* 46* 44* 39* 45*  CREATININE 4.64* 4.53* 4.68* 4.27* 4.30*  CALCIUM 8.5* 8.3* 8.4* 8.4* 8.5*  PHOS  --  3.8 5.1* 5.1* 5.0*   Liver Function Tests: Recent Labs  Lab 01/31/18 1511 02/01/18 0513 02/02/18 0457 02/03/18 0355 02/05/18 0409 02/06/18 0432  AST 21 20  --   --   --   --   ALT 9 9  --   --   --   --   ALKPHOS 116 84  --   --   --   --   BILITOT 0.8 0.8  --   --   --   --   PROT 7.6 6.2*  --   --   --   --   ALBUMIN 3.1* 2.5* 2.3* 2.7* 2.8* 3.0*   No results for input(s): LIPASE, AMYLASE in the last 168 hours. No results for input(s): AMMONIA in the last 168 hours. Coagulation Profile: Recent Labs  Lab 01/31/18 1511 02/04/18 1717 02/05/18 0409 02/06/18 0432  INR 1.37 1.18 1.38 1.30   CBC: Recent Labs  Lab 01/31/18 1511  02/02/18 0457 02/03/18 0355 02/04/18 0409 02/05/18 0409 02/06/18 0432  WBC 17.1*  --  6.1 10.2 9.7 8.8 9.4  NEUTROABS 15.5*  --   --   --   --   --   --   HGB 10.3*   < > 7.9* 9.6* 9.6* 9.3* 9.7*  HCT 33.0*   < > 25.0* 30.3* 30.8* 29.7* 31.9*  MCV 96.5  --  94.0 96.5 97.5 94.3 95.2  PLT 340  --  259 339 270 320 339   < > = values in this interval not displayed.   Cardiac Enzymes: Recent Labs  Lab 01/31/18 1711 01/31/18 2258 02/01/18 0513 02/01/18 1120 02/01/18 1517  CKTOTAL 139  --   --   --    --   TROPONINI  --  0.03* 0.04* 0.03* 0.03*   BNP: Invalid input(s): POCBNP CBG: Recent Labs  Lab 02/01/18 2354 02/02/18 0405 02/02/18 0723 02/02/18 1132 02/03/18 0752  GLUCAP 77 84 85 91 98   HbA1C: No results for input(s): HGBA1C in the last 72 hours. Urine analysis:    Component Value Date/Time   COLORURINE YELLOW 01/31/2018 1726   APPEARANCEUR CLEAR 01/31/2018 1726   LABSPEC 1.012 01/31/2018 1726   PHURINE 5.0 01/31/2018 1726   GLUCOSEU NEGATIVE 01/31/2018 1726   HGBUR SMALL (A) 01/31/2018 1726   BILIRUBINUR NEGATIVE 01/31/2018 1726   KETONESUR NEGATIVE 01/31/2018 1726   PROTEINUR 30 (A) 01/31/2018 1726   UROBILINOGEN 0.2 03/03/2010 1056   NITRITE NEGATIVE 01/31/2018 1726   LEUKOCYTESUR NEGATIVE 01/31/2018 1726   Sepsis Labs: '@LABRCNTIP' (procalcitonin:4,lacticidven:4) ) Recent Results (from the past 240 hour(s))  Urine culture     Status: None   Collection Time: 01/27/18  6:35 PM  Result Value Ref Range Status  Specimen Description   Final    URINE, CLEAN CATCH Performed at [facility], [address], [place], [place] [address]    Special Requests   Final    Normal Performed at [facility], [address] [place], [place] [address]    Culture   Final    NO GROWTH Performed at [facility], [place] [address], [place], [place] [address]    Report Status 01/28/2018 FINAL  Final  Urine Culture     Status: None   Collection Time: 01/31/18  5:26 PM  Result Value Ref Range Status   Specimen Description   Final    URINE, CATHETERIZED Performed at [facility], [address], [place], [place] [address]    Special Requests   Final    NONE Performed at [facility], [address] [place], [place] [address]    Culture   Final    NO GROWTH Performed at [facility], [place] [address] [place], [place] [address]    Report Status 02/02/2018 FINAL  Final  Culture, blood (Routine X 2) w Reflex to ID Panel     Status: None   Collection Time: 01/31/18   7:05 PM  Result Value Ref Range Status   Specimen Description   Final    BLOOD RIGHT HAND BOTTLES DRAWN AEROBIC ONLY Blood Culture adequate volume   Special Requests NONE  Final   Culture   Final    NO GROWTH 5 DAYS Performed at [facility], [address], [place], [place] [address]    Report Status 02/05/2018 FINAL  Final  Culture, blood (Routine X 2) w Reflex to ID Panel     Status: None   Collection Time: 01/31/18  7:10 PM  Result Value Ref Range Status   Specimen Description   Final    BLOOD LEFT HAND Blood Culture adequate volume BOTTLES DRAWN AEROBIC ONLY   Special Requests NONE  Final   Culture   Final    NO GROWTH 5 DAYS Performed at [facility], [address] [place],  [address]    Report Status 02/05/2018 FINAL  Final     Scheduled Meds: . budesonide (PULMICORT) nebulizer solution  0.5 mg Nebulization BID  . diltiazem  360 mg Oral Daily  . enoxaparin (LOVENOX) injection  1 mg/kg Subcutaneous Q24H  . furosemide  40 mg Oral Daily  . ipratropium-albuterol  3 mL Nebulization QID  . metoprolol tartrate  50 mg Oral BID  . pantoprazole  40 mg Oral Daily  . predniSONE  60 mg Oral Q breakfast  . Warfarin - Pharmacist Dosing Inpatient   Does not apply q1800   Continuous Infusions: . sodium chloride Stopped (02/04/18 0555)  . piperacillin-tazobactam (ZOSYN)  IV 3.375 g (02/06/18 0954)  . vancomycin Stopped (02/04/18 2336)    Procedures/Studies: Dg Chest 2 View  Result Date: 01/26/2018 CLINICAL DATA:  Shortness of breath EXAM: CHEST - 2 VIEW COMPARISON:  01/09/2018, 12/04/2017 FINDINGS: Right-sided central venous catheter tip over the SVC. Bibasilar atelectasis or scarring. No pleural effusion. No focal consolidation. Stable cardiomediastinal silhouette with aortic atherosclerosis. No pneumothorax. IMPRESSION: No active cardiopulmonary disease. Stable scarring and/or atelectasis at both lung bases. Electronically Signed   By: Donavan Foil M.D.   On: 01/26/2018  23:23   Dg Chest 2 View  Result Date: 01/09/2018 CLINICAL DATA:  Short of breath EXAM: CHEST - 2 VIEW COMPARISON:  12/04/2017 FINDINGS: Right-sided central venous catheter tip over the SVC. Bilateral hyperinflation. Non  inclusion of the CP angles on the lateral view. Streaky atelectasis or scarring at the bases. Stable borderline cardiomegaly. No pneumothorax. IMPRESSION: 1. Non inclusion of the lung bases on lateral view. 2. Streaky atelectasis or scarring at the bases. Electronically Signed   By: Donavan Foil M.D.   On: 01/09/2018 16:16   Dg Lumbar Spine 2-3 Views  Result Date: 01/28/2018 CLINICAL DATA:  Discitis EXAM: LUMBAR SPINE - 2-3 VIEW COMPARISON:  MRI from 01/06/2018 FINDINGS: Five lumbar type vertebral bodies are well visualized. Vertebral body height is well maintained at L1, L3-L4 and L5. Interbody fusion at L2-3 and L3-4 is seen. Increase kyphosis at L1-2 is noted related to interval superior endplate collapse at L2. MRI may be helpful for further evaluation. Multilevel osteophytic changes are noted. Diffuse vascular calcifications with right iliac stenting are noted. IMPRESSION: Progressive kyphosis at L1-2 related to superior endplate compression of L2. MRI may be helpful for further evaluation. Electronically Signed   By: Inez Catalina M.D.   On: 01/28/2018 14:12   Ct Head Wo Contrast  Result Date: 01/31/2018 CLINICAL DATA:  Found unresponsive EXAM: CT HEAD WITHOUT CONTRAST CT CERVICAL SPINE WITHOUT CONTRAST TECHNIQUE: Multidetector CT imaging of the head and cervical spine was performed following the standard protocol without intravenous contrast. Multiplanar CT image reconstructions of the cervical spine were also generated. COMPARISON:  12/25/2017 FINDINGS: CT HEAD FINDINGS Brain: There is atrophy and chronic small vessel disease changes. No acute intracranial abnormality. Specifically, no hemorrhage, hydrocephalus, mass lesion, acute infarction, or significant intracranial injury.  Vascular: No hyperdense vessel or unexpected calcification. Skull: No acute calvarial abnormality. Sinuses/Orbits: Diffuse mucosal thickening throughout the ethmoid air cells and right sphenoid sinus. Mastoid air cells clear. No orbital soft tissue abnormality. Other: None CT CERVICAL SPINE FINDINGS Alignment: No subluxation Skull base and vertebrae: No acute fracture. No primary bone lesion or focal pathologic process. Soft tissues and spinal canal: No prevertebral fluid or swelling. No visible canal hematoma. Disc levels: Prior anterior fusion from C3-C5. Diffuse degenerative disc and facet disease. Upper chest: Emphysematous changes in the apices. No acute findings. Other: Carotid artery calcifications bilaterally. IMPRESSION: No acute intracranial abnormality. Atrophy, chronic microvascular disease. Prior anterior fusion from C3-C5. Diffuse degenerative disc and facet disease in the cervical spine. No acute bony abnormality. Electronically Signed   By: Rolm Baptise M.D.   On: 01/31/2018 19:42   Ct Cervical Spine Wo Contrast  Result Date: 01/31/2018 CLINICAL DATA:  Found unresponsive EXAM: CT HEAD WITHOUT CONTRAST CT CERVICAL SPINE WITHOUT CONTRAST TECHNIQUE: Multidetector CT imaging of the head and cervical spine was performed following the standard protocol without intravenous contrast. Multiplanar CT image reconstructions of the cervical spine were also generated. COMPARISON:  12/25/2017 FINDINGS: CT HEAD FINDINGS Brain: There is atrophy and chronic small vessel disease changes. No acute intracranial abnormality. Specifically, no hemorrhage, hydrocephalus, mass lesion, acute infarction, or significant intracranial injury. Vascular: No hyperdense vessel or unexpected calcification. Skull: No acute calvarial abnormality. Sinuses/Orbits: Diffuse mucosal thickening throughout the ethmoid air cells and right sphenoid sinus. Mastoid air cells clear. No orbital soft tissue abnormality. Other: None CT CERVICAL  SPINE FINDINGS Alignment: No subluxation Skull base and vertebrae: No acute fracture. No primary bone lesion or focal pathologic process. Soft tissues and spinal canal: No prevertebral fluid or swelling. No visible canal hematoma. Disc levels: Prior anterior fusion from C3-C5. Diffuse degenerative disc and facet disease. Upper chest: Emphysematous changes in the apices. No acute findings. Other: Carotid artery calcifications bilaterally. IMPRESSION: No acute  intracranial abnormality. Atrophy, chronic microvascular disease. Prior anterior fusion from C3-C5. Diffuse degenerative disc and facet disease in the cervical spine. No acute bony abnormality. Electronically Signed   By: Rolm Baptise M.D.   On: 01/31/2018 19:42   Ct Aspiration  Result Date: 01/10/2018 INDICATION: L1-2 and L4-5 discitis.  Left L2 psoas small abscess. EXAM: CT-GUIDED ASPIRATION MEDICATIONS: The patient is currently admitted to the hospital and receiving intravenous antibiotics. The antibiotics were administered within an appropriate time frame prior to the initiation of the procedure. ANESTHESIA/SEDATION: Fentanyl 50 mcg IV; Versed 1.5 mg IV Moderate Sedation Time:  10 minutes The patient was continuously monitored during the procedure by the interventional radiology nurse under my direct supervision. COMPLICATIONS: None immediate. PROCEDURE: Informed written consent was obtained from the patient after a thorough discussion of the procedural risks, benefits and alternatives. All questions were addressed. Maximal Sterile Barrier Technique was utilized including caps, mask, sterile gowns, sterile gloves, sterile drape, hand hygiene and skin antiseptic. A timeout was performed prior to the initiation of the procedure. The back was prepped and draped in a sterile fashion. 1% lidocaine was utilized for local anesthesia. Under CT guidance, an 18 gauge needle was inserted into the left L2 paraspinal fluid collection. Aspiration yielded 2 drops  bloody fluid. FINDINGS: Imaging demonstrates needle placement in the left L2 paraspinal fluid collection. IMPRESSION: Successful left paraspinal L2 aspiration yielding 2 drops bloody fluid. Electronically Signed   By: Marybelle Killings M.D.   On: 01/10/2018 16:00   US Venous Img Upper Uni Right  Result Date: 02/03/2018 CLINICAL DATA:  Right upper extremity pain and edema. History of recent dialysis catheter removal. Evaluate for DVT. EXAM: RIGHT UPPER EXTREMITY VENOUS DOPPLER ULTRASOUND TECHNIQUE: Gray-scale sonography with graded compression, as well as color Doppler and duplex ultrasound were performed to evaluate the upper extremity deep venous system from the level of the subclavian vein and including the jugular, axillary, basilic, radial, ulnar and upper cephalic vein. Spectral Doppler was utilized to evaluate flow at rest and with distal augmentation maneuvers. COMPARISON:  None. FINDINGS: Contralateral Subclavian Vein: Respiratory phasicity is normal and symmetric with the symptomatic side. No evidence of thrombus. Normal compressibility. Internal Jugular Vein: No evidence of thrombus. Normal compressibility, respiratory phasicity and response to augmentation. Subclavian Vein: No evidence of thrombus. Normal compressibility, respiratory phasicity and response to augmentation. Axillary Vein: No evidence of thrombus. Normal compressibility, respiratory phasicity and response to augmentation. Cephalic Vein: No evidence of thrombus. Normal compressibility, respiratory phasicity and response to augmentation. Basilic Vein: No evidence of thrombus. Normal compressibility, respiratory phasicity and response to augmentation. Brachial Veins: No evidence of thrombus. Normal compressibility, respiratory phasicity and response to augmentation. Radial Veins: No evidence of thrombus. Normal compressibility, respiratory phasicity and response to augmentation. Ulnar Veins: No evidence of thrombus. Normal compressibility,  respiratory phasicity and response to augmentation. Venous Reflux:  None visualized. Other Findings:  None visualized. IMPRESSION: No evidence of DVT within the right upper extremity. Electronically Signed   By: Sandi Mariscal M.D.   On: 02/03/2018 15:27   Dg Chest Port 1 View  Result Date: 02/02/2018 CLINICAL DATA:  Bilateral inspiratory rales EXAM: PORTABLE CHEST 1 VIEW COMPARISON:  01/31/2018 FINDINGS: Endotracheal tube with the tip 7 cm above the carina. Nasogastric tube coursing below the diaphragm. Mild bilateral interstitial thickening. Possible trace left pleural effusion. No pneumothorax. Increased opacity in the left retrocardiac region likely related to technique. Stable cardiomediastinal silhouette. No acute osseous abnormality. IMPRESSION: 1. Endotracheal tube with the tip  7 cm above the carina. 2. Nasogastric tube coursing below the diaphragm. 3. Bilateral diffuse mild interstitial thickening which may reflect mild interstitial edema versus infection. Electronically Signed   By: Kathreen Devoid   On: 02/02/2018 10:19   Dg Chest Port 1 View  Result Date: 01/31/2018 CLINICAL DATA:  Intubated EXAM: PORTABLE CHEST 1 VIEW COMPARISON:  Chest radiograph from earlier today. FINDINGS: Endotracheal tube tip is 14.0 cm above the carina. Enteric tube coils in cervical esophagus and enters the stomach with the tip not seen on this image. Stable cardiomediastinal silhouette with normal heart size. No pneumothorax. No pleural effusion. No pulmonary edema. Mild left basilar scarring versus atelectasis. No acute consolidative airspace disease. IMPRESSION: 1. Endotracheal tube tip 14.0 cm above the carina, recommend advancing. 2. Enteric tube coils in the cervical esophagus and enters the stomach with the tip not seen on this image. 3. Mild left basilar scarring versus atelectasis. These results were called by telephone at the time of interpretation on 01/31/2018 at 6:30 pm to Dr. Noemi Chapel , who verbally  acknowledged these results. Electronically Signed   By: Ilona Sorrel M.D.   On: 01/31/2018 18:32   Dg Chest Port 1 View  Result Date: 01/31/2018 CLINICAL DATA:  Unconscious patient. EXAM: PORTABLE CHEST 1 VIEW COMPARISON:  01/26/2018 FINDINGS: Lower portions of the thorax are excluded by collimation. Interval removal of dialysis catheter. Mildly enlarged cardiac silhouette. Mediastinal contours appear intact. There is no evidence of focal airspace consolidation, pleural effusion or pneumothorax. Osseous structures are without acute abnormality. Soft tissues are grossly normal. IMPRESSION: Mildly enlarged cardiac silhouette. Otherwise no acute abnormalities, accounting for excluding of the lower thorax. Electronically Signed   By: Fidela Salisbury M.D.   On: 01/31/2018 17:49    Orson Eva, DO  [facility] Pager [phone]  If 7PM-7AM, please contact night-coverage [email or web address] Password [email or web address] 02/06/2018, 5:18 PM   LOS: 6 days

## 2018-02-06 NOTE — Progress Notes (Signed)
Subjective: Interval History: Patient is feeling much better.  He denies any difficulty breathing today.  He has occasional cough but no sputum production.  Objective: Vital signs in last 24 hours: Temp:  [98 F (36.7 C)-98.4 F (36.9 C)] 98.4 F (36.9 C) (10/26 0737) Pulse Rate:  [82-112] 88 (10/26 0737) Resp:  [14-31] 29 (10/26 0737) BP: (149-183)/(85-132) 183/120 (10/26 0622) SpO2:  [89 %-100 %] 96 % (10/26 0750) FiO2 (%):  [28 %] 28 % (10/26 0000) Weight:  [85.3 kg] 85.3 kg (10/26 0430) Weight change: -0.3 kg  Intake/Output from previous day: 10/25 0701 - 10/26 0700 In: 3329.8 [I.V.:2853.6; IV Piggyback:476.1] Out: 1550 [Urine:1550] Intake/Output this shift: No intake/output data recorded.  Patient is alert and in no apparent distress. Chest: Clear to auscultation, no wheezing. Heart exam revealed iregular rate and rhythm no murmur Abdomen: Soft positive bowel sounds Extremities no edema  Lab Results: Recent Labs    02/05/18 0409 02/06/18 0432  WBC 8.8 9.4  HGB 9.3* 9.7*  HCT 29.7* 31.9*  PLT 320 339   BMET:  Recent Labs    02/05/18 0409 02/06/18 0432  NA 138 138  K 3.9 3.5  CL 97* 98  CO2 27 26  GLUCOSE 124* 155*  BUN 39* 45*  CREATININE 4.27* 4.30*  CALCIUM 8.4* 8.5*   No results for input(s): PTH in the last 72 hours. Iron Studies:  No results for input(s): IRON, TIBC, TRANSFERRIN, FERRITIN in the last 72 hours.  Studies/Results: No results found.  I have reviewed the patient's current medications.  Assessment/Plan: 1] acute on chronic renal failure: His renal function is stable.  He has not had any nausea or vomiting. 2] altered mental status: Has improved.  His mental status seems to have returned to his baseline. 3] respiratory failure: Patient with history of COPD.  Presently he is asking to get oxygen at home to use it at night.  His breathing is much better. 4] bone and mineral disorder: Calcium and phosphorus is range 5] anemia: His  hemoglobin is low but progressively improving. 6] hypertension: His blood pressure is reasonably controlled 7] history of paroxysmal atrial tachycardia: His heart rate is controlled 8] low CO2: Possibly metabolic acidosis.  His CO2 is 26 normal. Plan: 1] we will decrease IV fluid. 2] we will DC IV Lasix and start on Lasix 40 mg p.o. once a day 3] DC Foley catheter 4] we will check his renal panel and CBC in the morning. 5] we will see him in 3 weeks as an outpatient when he is going to be discharged. .   LOS: 6 days   Lisl Slingerland S 02/06/2018,8:37 AM

## 2018-02-06 NOTE — Progress Notes (Signed)
ANTICOAGULATION CONSULT NOTE - Follow up [place] for Heparin >> Lovenox>> Coumadin Indication: atrial fibrillation  No Known Allergies  Patient Measurements: Height: 6\' 4"  (193 cm) Weight: 188 lb 0.8 oz (85.3 kg) IBW/kg (Calculated) : 86.8 HEPARIN DW (KG): 85  Vital Signs: Temp: 98.4 F (36.9 C) (10/26 0737) Temp Source: Axillary (10/26 0737) BP: 183/120 (10/26 0622) Pulse Rate: 88 (10/26 0737)  Labs: Recent Labs    02/04/18 0409 02/04/18 1717 02/05/18 0409 02/06/18 0432  HGB 9.6*  --  9.3* 9.7*  HCT 30.8*  --  29.7* 31.9*  PLT 270  --  320 339  LABPROT  --  14.9 16.8* 16.0*  INR  --  1.18 1.38 1.30  CREATININE  --   --  4.27* 4.30*    Estimated Creatinine Clearance: 21.8 mL/min (A) (by C-G formula based on SCr of 4.3 mg/dL (H)).  Assessment: 62 y.o. male, with history of anxiety, osteoarthritis, atrial fibrillation on chronic anticoagulation with Coumadin, CAD, chronic back pain, COPD, ESRD who was previously on hemodialysis, nephrology had stopped dialysis as patient had been noncompliant and started making urine as per note from previous hospitalization from 01/29/2018, dialysis catheter was removed.  Patient recently was admitted with sepsis due to paraspinal abscess status post drainage, lumbar discitis.  He was discharged home on ciprofloxacin and Augmentin for 6 weeks of therapy.  At that time he also had a TEE which was negative for vegetation. Patient has been on Coumadin for afib.   Transitioned from Heparin to SQ Lovenox. Now will add coumadin back since extubated and bridge until INR > 2  INR 1.30     Goal of Therapy:  INR 2-3 Monitor platelets by anticoagulation protocol: Yes   Plan:  Lovenox 1mg /kg SQ every 24 hours (adjusted to renal function) Coumadin 7.5mg  po x 1 today PT-INR daily Continue to monitor H&H and platelets  Thanks for allowing pharmacy to be a part of this patient's care.  Margot Ables, PharmD Clinical  Pharmacist 02/06/2018 9:07 AM

## 2018-02-06 NOTE — Progress Notes (Signed)
CRITICAL VALUE ALERT  Critical Value:  Vanc 27  Date & Time Notied:  10/26 2345  Provider Notified: Kennon Holter  Orders Received/Actions taken:   Vanc held due to super therapeutic level

## 2018-02-07 LAB — RENAL FUNCTION PANEL
Albumin: 3.1 g/dL — ABNORMAL LOW (ref 3.5–5.0)
Anion gap: 15 (ref 5–15)
BUN: 51 mg/dL — ABNORMAL HIGH (ref 8–23)
CALCIUM: 8.4 mg/dL — AB (ref 8.9–10.3)
CHLORIDE: 98 mmol/L (ref 98–111)
CO2: 25 mmol/L (ref 22–32)
CREATININE: 4.25 mg/dL — AB (ref 0.61–1.24)
GFR, EST AFRICAN AMERICAN: 16 mL/min — AB (ref >60)
GFR, EST NON AFRICAN AMERICAN: 14 mL/min — AB (ref >60)
Glucose, Bld: 93 mg/dL (ref 70–99)
Phosphorus: 4.9 mg/dL — ABNORMAL HIGH (ref 2.5–4.6)
Potassium: 3.6 mmol/L (ref 3.5–5.1)
SODIUM: 138 mmol/L (ref 135–145)

## 2018-02-07 LAB — SEDIMENTATION RATE: SED RATE: 119 mm/h — AB (ref 0–16)

## 2018-02-07 LAB — PROTIME-INR
INR: 1.43
Prothrombin Time: 17.3 seconds — ABNORMAL HIGH (ref 11.4–15.2)

## 2018-02-07 LAB — MAGNESIUM: MAGNESIUM: 1.9 mg/dL (ref 1.7–2.4)

## 2018-02-07 LAB — C-REACTIVE PROTEIN: CRP: 4.1 mg/dL — ABNORMAL HIGH (ref <1.0)

## 2018-02-07 MED ORDER — HYDRALAZINE HCL 25 MG PO TABS
50.0000 mg | ORAL_TABLET | Freq: Three times a day (TID) | ORAL | Status: DC
  Administered 2018-02-07 – 2018-02-08 (×5): 50 mg via ORAL
  Filled 2018-02-07 (×6): qty 2

## 2018-02-07 MED ORDER — ALUM & MAG HYDROXIDE-SIMETH 200-200-20 MG/5ML PO SUSP
30.0000 mL | ORAL | Status: DC | PRN
  Administered 2018-02-07: 30 mL via ORAL
  Filled 2018-02-07: qty 30

## 2018-02-07 MED ORDER — SODIUM CHLORIDE 0.9 % IV SOLN: 1.0000 g | Freq: Two times a day (BID) | INTRAVENOUS | Status: DC

## 2018-02-07 MED ORDER — METOPROLOL TARTRATE 50 MG PO TABS
100.0000 mg | ORAL_TABLET | Freq: Two times a day (BID) | ORAL | Status: DC
  Administered 2018-02-07 – 2018-02-08 (×2): 100 mg via ORAL
  Filled 2018-02-07 (×2): qty 2

## 2018-02-07 MED ORDER — VANCOMYCIN HCL IN DEXTROSE 750-5 MG/150ML-% IV SOLN
750.0000 mg | INTRAVENOUS | Status: DC
  Administered 2018-02-07: 750 mg via INTRAVENOUS
  Filled 2018-02-07: qty 150

## 2018-02-07 MED ORDER — SODIUM CHLORIDE 0.9 % IV SOLN: 2.0000 g | Freq: Two times a day (BID) | INTRAVENOUS | Status: DC

## 2018-02-07 MED ORDER — SODIUM CHLORIDE 0.9 % IV SOLN: 2.0000 g | INTRAVENOUS | Status: DC

## 2018-02-07 MED ORDER — SODIUM CHLORIDE 0.9 % IV SOLN
2.0000 g | INTRAVENOUS | Status: DC
  Administered 2018-02-07: 2 g via INTRAVENOUS
  Filled 2018-02-07 (×3): qty 20

## 2018-02-07 MED ORDER — WARFARIN SODIUM 7.5 MG PO TABS
7.5000 mg | ORAL_TABLET | Freq: Once | ORAL | Status: AC
  Administered 2018-02-07: 7.5 mg via ORAL
  Filled 2018-02-07: qty 1

## 2018-02-07 NOTE — Progress Notes (Signed)
Pt blood pressures continuing to run high. Pt states this is normal for him. He is not reporting any headache or blurred vision. Asked patient if he checks his blood pressure regularly at home and he does not. Gave another PRN dose of hydralazine as his blood pressure is 182/119.

## 2018-02-07 NOTE — Progress Notes (Signed)
PROGRESS NOTE  Aaron [name] KKX:[id number] DOB: July 22, 1955 DOA: 01/31/2018 PCP: Gwenlyn Saran [place]  Brief History: 62 year old with a history of anxiety, osteoarthritis, atrial fibrillation, CAD, chronic back pain,lumbar discitis and paraspinal abscessCOPD, ESRDpreviouslyon hemodialysis, GERD, hepatitis C, essential hypertension, PAD, medical noncompliance came to the ERwhen he was found by his family unconscious on the floor covered in his own stool and urine. The patient apparently had agonal respirations with oxygen saturation 80% range. He was intubated for airway protection. The patient was recently admitted to the hospital from 01/26/2018 through 01/29/2018 during which the patient refused dialysis and was subsequently taken off of dialysis by nephrology. His dialysis catheter was removed at that time. The patient has had a long history of noncompliance including leaving AMA from the hospitalization 01/09/2018 through 01/13/2018.The patient was most recently discharged on Augmentin and ciprofloxacin for his paraspinal abscess.  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 and hypnotics -Patient stated that he took some of his sister's "tranquilizers"and suboxone -Intubated in the emergency department -Extubated 02/02/2018 -weanedoxygen off   Acute metabolic and toxic encephalopathy -secondary to opioid overuse -The [facility] Controlled Substance Reporting System has been queried for this patient--the patient received Percocet 10 mg #120 on 12/29/2017 and subsequently an additional 35 Percocet filled since that time -pt mental status now back to baseline -UDS--positive opiates  CKD stage V -Nephrology following -Patient has refused dialysis;he was poorly compliant not going to his outpatient dialysis center -bicarbonate drip per nephrology--started  02/02/2018-->stopped 02/04/18 -IVF per renal -Left arm AV fistula ligated secondary to bleeding 12/2017 -Continue furosemide per nephrology  Lumbar discitis and paraspinal abscess -01/10/2018 drainage--bacillus species -Was previously on vancomycin and ceftazidime when he was on dialysis -Continue vancomycin  -d/c zosyn -start ceftriaxone -ESR--119 and CRP--4.1   COPD Exacerbation -Continueduonebs -continuepulmicort -short course prednisone  Elevated troponin -Secondary to ESRD -Trend is flat -No chest pain presently -Personally reviewed EKG--atrial fibrillation, nonspecific T wave changes  Chronic atrial fibrillationwith RVR -increased diltiazem360 mg daily -increase metoprolol to 100 mg bid -01/12/2018 TEE--EF 50-55%, no vegetation, mild MR, HK anterior septal -INR was subtherapeutic at the time of admission -was initially on IV heparin/[place] enoxaparin earlier in the admission -restart warfarin  Essential hypertension -Previously on carvedilol prior to admission -Continue IV Lopressor for now>>>transition to po     Disposition Plan: [id number]/[id number] or 10/29 if stable Family Communication:spouse updated 10/26   Consultants:nephrology  Code Status: FULL   DVT Prophylaxis: [place] Lovenox   Procedures: As Listed in Progress Note Above  Antibiotics: vanco 10/21>>> Zosyn 10/21>>>10/27 Ceftriaxone 10/27>>>    Subjective: Patient denies fevers, chills, headache, chest pain, nausea, vomiting, diarrhea, abdominal pain, dysuria, hematuria, hematochezia, and melena. States back pain is controlled.  He complains of dyspnea on exertion, but not sob at rest  Objective: Vitals:   02/07/18 1553 02/07/18 1600 02/07/18 1700 02/07/18 1800  BP:      Pulse: (!) 104 (!) 113 (!) 118 (!) 104  Resp: '18  15 16  ' Temp: 98.3 F (36.8 C)     TempSrc: Oral     SpO2: 97% 95% 93% 97%  Weight:      Height:        Intake/Output Summary (Last 24 hours) at  02/07/2018 1828 Last data filed at 02/07/2018 1718 Gross per 24 hour  Intake 750 ml  Output 250 ml  Net 500 ml   Weight change: -  1.6 kg Exam:   General:  Pt is alert, follows commands appropriately, not in acute distress  HEENT: No icterus, No thrush, No neck mass, [place]/AT  Cardiovascular: IRRR, S1/S2, no rubs, no gallops  Respiratory: fine bibasilar crackles, no wheeze  Abdomen: Soft/+BS, non tender, non distended, no guarding  Extremities: No edema, No lymphangitis, No petechiae, No rashes, no synovitis   Data Reviewed: I have personally reviewed following labs and imaging studies Basic Metabolic Panel: Recent Labs  Lab 02/02/18 0457 02/03/18 0355 02/05/18 0409 02/06/18 0432 02/07/18 0442 02/07/18 0443  NA 139 139 138 138  --  138  K 3.6 3.6 3.9 3.5  --  3.6  CL 113* 109 97* 98  --  98  CO2 14* 21* 27 26  --  25  GLUCOSE 85 91 124* 155*  --  93  BUN 46* 44* 39* 45*  --  51*  CREATININE 4.53* 4.68* 4.27* 4.30*  --  4.25*  CALCIUM 8.3* 8.4* 8.4* 8.5*  --  8.4*  MG  --   --   --   --  1.9  --   PHOS 3.8 5.1* 5.1* 5.0*  --  4.9*   Liver Function Tests: Recent Labs  Lab 02/01/18 0513 02/02/18 0457 02/03/18 0355 02/05/18 0409 02/06/18 0432 02/07/18 0443  AST 20  --   --   --   --   --   ALT 9  --   --   --   --   --   ALKPHOS 84  --   --   --   --   --   BILITOT 0.8  --   --   --   --   --   PROT 6.2*  --   --   --   --   --   ALBUMIN 2.5* 2.3* 2.7* 2.8* 3.0* 3.1*   No results for input(s): LIPASE, AMYLASE in the last 168 hours. No results for input(s): AMMONIA in the last 168 hours. Coagulation Profile: Recent Labs  Lab 02/04/18 1717 02/05/18 0409 02/06/18 0432 02/07/18 0442  INR 1.18 1.38 1.30 1.43   CBC: Recent Labs  Lab 02/02/18 0457 02/03/18 0355 02/04/18 0409 02/05/18 0409 02/06/18 0432  WBC 6.1 10.2 9.7 8.8 9.4  HGB 7.9* 9.6* 9.6* 9.3* 9.7*  HCT 25.0* 30.3* 30.8* 29.7* 31.9*  MCV 94.0 96.5 97.5 94.3 95.2  PLT 259 339 270 320 339    Cardiac Enzymes: Recent Labs  Lab 01/31/18 2258 02/01/18 0513 02/01/18 1120 02/01/18 1517  TROPONINI 0.03* 0.04* 0.03* 0.03*   BNP: Invalid input(s): POCBNP CBG: Recent Labs  Lab 02/01/18 2354 02/02/18 0405 02/02/18 0723 02/02/18 1132 02/03/18 0752  GLUCAP 77 84 85 91 98   HbA1C: No results for input(s): HGBA1C in the last 72 hours. Urine analysis:    Component Value Date/Time   COLORURINE YELLOW 01/31/2018 1726   APPEARANCEUR CLEAR 01/31/2018 1726   LABSPEC 1.012 01/31/2018 1726   PHURINE 5.0 01/31/2018 1726   GLUCOSEU NEGATIVE 01/31/2018 1726   HGBUR SMALL (A) 01/31/2018 1726   BILIRUBINUR NEGATIVE 01/31/2018 1726   KETONESUR NEGATIVE 01/31/2018 1726   PROTEINUR 30 (A) 01/31/2018 1726   UROBILINOGEN 0.2 03/03/2010 1056   NITRITE NEGATIVE 01/31/2018 1726   LEUKOCYTESUR NEGATIVE 01/31/2018 1726   Sepsis Labs: '@LABRCNTIP' (procalcitonin:4,lacticidven:4) ) Recent Results (from the past 240 hour(s))  Urine Culture     Status: None   Collection Time: 01/31/18  5:26 PM  Result Value Ref Range Status  Specimen Description   Final    URINE, CATHETERIZED Performed at [facility], [address], [place], [place] [address]    Special Requests   Final    NONE Performed at [facility], [address], [place], [place] [address]    Culture   Final    NO GROWTH Performed at [facility], [place] [address], [place], [place] [address]    Report Status 02/02/2018 FINAL  Final  Culture, blood (Routine X 2) w Reflex to ID Panel     Status: None   Collection Time: 01/31/18  7:05 PM  Result Value Ref Range Status   Specimen Description   Final    BLOOD RIGHT HAND BOTTLES DRAWN AEROBIC ONLY Blood Culture adequate volume   Special Requests NONE  Final   Culture   Final    NO GROWTH 5 DAYS Performed at [facility], [address], [place], [place] [address]    Report Status 02/05/2018 FINAL  Final  Culture, blood (Routine X 2) w Reflex to ID Panel     Status: None    Collection Time: 01/31/18  7:10 PM  Result Value Ref Range Status   Specimen Description   Final    BLOOD LEFT HAND Blood Culture adequate volume BOTTLES DRAWN AEROBIC ONLY   Special Requests NONE  Final   Culture   Final    NO GROWTH 5 DAYS Performed at [facility], [address], [place], [place] [address]    Report Status 02/05/2018 FINAL  Final     Scheduled Meds: . budesonide (PULMICORT) nebulizer solution  0.5 mg Nebulization BID  . diltiazem  360 mg Oral Daily  . enoxaparin (LOVENOX) injection  1 mg/kg Subcutaneous Q24H  . furosemide  40 mg Oral Daily  . hydrALAZINE  50 mg Oral Q8H  . ipratropium-albuterol  3 mL Nebulization QID  . metoprolol tartrate  100 mg Oral BID  . nicotine  21 mg Transdermal Daily  . pantoprazole  40 mg Oral Daily  . predniSONE  60 mg Oral Q breakfast  . Warfarin - Pharmacist Dosing Inpatient   Does not apply q1800   Continuous Infusions: . sodium chloride Stopped (02/04/18 0555)  . piperacillin-tazobactam (ZOSYN)  IV Stopped (02/07/18 1427)  . vancomycin 750 mg (02/07/18 1718)    Procedures/Studies: Dg Chest 2 View  Result Date: 01/26/2018 CLINICAL DATA:  Shortness of breath EXAM: CHEST - 2 VIEW COMPARISON:  01/09/2018, 12/04/2017 FINDINGS: Right-sided central venous catheter tip over the SVC. Bibasilar atelectasis or scarring. No pleural effusion. No focal consolidation. Stable cardiomediastinal silhouette with aortic atherosclerosis. No pneumothorax. IMPRESSION: No active cardiopulmonary disease. Stable scarring and/or atelectasis at both lung bases. Electronically Signed   By: Donavan Foil M.D.   On: 01/26/2018 23:23   Dg Chest 2 View  Result Date: 01/09/2018 CLINICAL DATA:  Short of breath EXAM: CHEST - 2 VIEW COMPARISON:  12/04/2017 FINDINGS: Right-sided central venous catheter tip over the SVC. Bilateral hyperinflation. Non inclusion of the CP angles on the lateral view. Streaky atelectasis or scarring at the bases. Stable borderline  cardiomegaly. No pneumothorax. IMPRESSION: 1. Non inclusion of the lung bases on lateral view. 2. Streaky atelectasis or scarring at the bases. Electronically Signed   By: Donavan Foil M.D.   On: 01/09/2018 16:16   Dg Lumbar Spine 2-3 Views  Result Date: 01/28/2018 CLINICAL DATA:  Discitis EXAM: LUMBAR SPINE - 2-3 VIEW COMPARISON:  MRI from 01/06/2018 FINDINGS: Five lumbar type vertebral bodies are well  visualized. Vertebral body height is well maintained at L1, L3-L4 and L5. Interbody fusion at L2-3 and L3-4 is seen. Increase kyphosis at L1-2 is noted related to interval superior endplate collapse at L2. MRI may be helpful for further evaluation. Multilevel osteophytic changes are noted. Diffuse vascular calcifications with right iliac stenting are noted. IMPRESSION: Progressive kyphosis at L1-2 related to superior endplate compression of L2. MRI may be helpful for further evaluation. Electronically Signed   By: Inez Catalina M.D.   On: 01/28/2018 14:12   Ct Head Wo Contrast  Result Date: 01/31/2018 CLINICAL DATA:  Found unresponsive EXAM: CT HEAD WITHOUT CONTRAST CT CERVICAL SPINE WITHOUT CONTRAST TECHNIQUE: Multidetector CT imaging of the head and cervical spine was performed following the standard protocol without intravenous contrast. Multiplanar CT image reconstructions of the cervical spine were also generated. COMPARISON:  12/25/2017 FINDINGS: CT HEAD FINDINGS Brain: There is atrophy and chronic small vessel disease changes. No acute intracranial abnormality. Specifically, no hemorrhage, hydrocephalus, mass lesion, acute infarction, or significant intracranial injury. Vascular: No hyperdense vessel or unexpected calcification. Skull: No acute calvarial abnormality. Sinuses/Orbits: Diffuse mucosal thickening throughout the ethmoid air cells and right sphenoid sinus. Mastoid air cells clear. No orbital soft tissue abnormality. Other: None CT CERVICAL SPINE FINDINGS Alignment: No subluxation Skull  base and vertebrae: No acute fracture. No primary bone lesion or focal pathologic process. Soft tissues and spinal canal: No prevertebral fluid or swelling. No visible canal hematoma. Disc levels: Prior anterior fusion from C3-C5. Diffuse degenerative disc and facet disease. Upper chest: Emphysematous changes in the apices. No acute findings. Other: Carotid artery calcifications bilaterally. IMPRESSION: No acute intracranial abnormality. Atrophy, chronic microvascular disease. Prior anterior fusion from C3-C5. Diffuse degenerative disc and facet disease in the cervical spine. No acute bony abnormality. Electronically Signed   By: Rolm Baptise M.D.   On: 01/31/2018 19:42   Ct Cervical Spine Wo Contrast  Result Date: 01/31/2018 CLINICAL DATA:  Found unresponsive EXAM: CT HEAD WITHOUT CONTRAST CT CERVICAL SPINE WITHOUT CONTRAST TECHNIQUE: Multidetector CT imaging of the head and cervical spine was performed following the standard protocol without intravenous contrast. Multiplanar CT image reconstructions of the cervical spine were also generated. COMPARISON:  12/25/2017 FINDINGS: CT HEAD FINDINGS Brain: There is atrophy and chronic small vessel disease changes. No acute intracranial abnormality. Specifically, no hemorrhage, hydrocephalus, mass lesion, acute infarction, or significant intracranial injury. Vascular: No hyperdense vessel or unexpected calcification. Skull: No acute calvarial abnormality. Sinuses/Orbits: Diffuse mucosal thickening throughout the ethmoid air cells and right sphenoid sinus. Mastoid air cells clear. No orbital soft tissue abnormality. Other: None CT CERVICAL SPINE FINDINGS Alignment: No subluxation Skull base and vertebrae: No acute fracture. No primary bone lesion or focal pathologic process. Soft tissues and spinal canal: No prevertebral fluid or swelling. No visible canal hematoma. Disc levels: Prior anterior fusion from C3-C5. Diffuse degenerative disc and facet disease. Upper chest:  Emphysematous changes in the apices. No acute findings. Other: Carotid artery calcifications bilaterally. IMPRESSION: No acute intracranial abnormality. Atrophy, chronic microvascular disease. Prior anterior fusion from C3-C5. Diffuse degenerative disc and facet disease in the cervical spine. No acute bony abnormality. Electronically Signed   By: Rolm Baptise M.D.   On: 01/31/2018 19:42   Ct Aspiration  Result Date: 01/10/2018 INDICATION: L1-2 and L4-5 discitis.  Left L2 psoas small abscess. EXAM: CT-GUIDED ASPIRATION MEDICATIONS: The patient is currently admitted to the hospital and receiving intravenous antibiotics. The antibiotics were administered within an appropriate time frame prior to the initiation of  the procedure. ANESTHESIA/SEDATION: Fentanyl 50 mcg IV; Versed 1.5 mg IV Moderate Sedation Time:  10 minutes The patient was continuously monitored during the procedure by the interventional radiology nurse under my direct supervision. COMPLICATIONS: None immediate. PROCEDURE: Informed written consent was obtained from the patient after a thorough discussion of the procedural risks, benefits and alternatives. All questions were addressed. Maximal Sterile Barrier Technique was utilized including caps, mask, sterile gowns, sterile gloves, sterile drape, hand hygiene and skin antiseptic. A timeout was performed prior to the initiation of the procedure. The back was prepped and draped in a sterile fashion. 1% lidocaine was utilized for local anesthesia. Under CT guidance, an 18 gauge needle was inserted into the left L2 paraspinal fluid collection. Aspiration yielded 2 drops bloody fluid. FINDINGS: Imaging demonstrates needle placement in the left L2 paraspinal fluid collection. IMPRESSION: Successful left paraspinal L2 aspiration yielding 2 drops bloody fluid. Electronically Signed   By: Marybelle Killings M.D.   On: 01/10/2018 16:00   US Venous Img Upper Uni Right  Result Date: 02/03/2018 CLINICAL DATA:   Right upper extremity pain and edema. History of recent dialysis catheter removal. Evaluate for DVT. EXAM: RIGHT UPPER EXTREMITY VENOUS DOPPLER ULTRASOUND TECHNIQUE: Gray-scale sonography with graded compression, as well as color Doppler and duplex ultrasound were performed to evaluate the upper extremity deep venous system from the level of the subclavian vein and including the jugular, axillary, basilic, radial, ulnar and upper cephalic vein. Spectral Doppler was utilized to evaluate flow at rest and with distal augmentation maneuvers. COMPARISON:  None. FINDINGS: Contralateral Subclavian Vein: Respiratory phasicity is normal and symmetric with the symptomatic side. No evidence of thrombus. Normal compressibility. Internal Jugular Vein: No evidence of thrombus. Normal compressibility, respiratory phasicity and response to augmentation. Subclavian Vein: No evidence of thrombus. Normal compressibility, respiratory phasicity and response to augmentation. Axillary Vein: No evidence of thrombus. Normal compressibility, respiratory phasicity and response to augmentation. Cephalic Vein: No evidence of thrombus. Normal compressibility, respiratory phasicity and response to augmentation. Basilic Vein: No evidence of thrombus. Normal compressibility, respiratory phasicity and response to augmentation. Brachial Veins: No evidence of thrombus. Normal compressibility, respiratory phasicity and response to augmentation. Radial Veins: No evidence of thrombus. Normal compressibility, respiratory phasicity and response to augmentation. Ulnar Veins: No evidence of thrombus. Normal compressibility, respiratory phasicity and response to augmentation. Venous Reflux:  None visualized. Other Findings:  None visualized. IMPRESSION: No evidence of DVT within the right upper extremity. Electronically Signed   By: Sandi Mariscal M.D.   On: 02/03/2018 15:27   Dg Chest Port 1 View  Result Date: 02/02/2018 CLINICAL DATA:  Bilateral  inspiratory rales EXAM: PORTABLE CHEST 1 VIEW COMPARISON:  01/31/2018 FINDINGS: Endotracheal tube with the tip 7 cm above the carina. Nasogastric tube coursing below the diaphragm. Mild bilateral interstitial thickening. Possible trace left pleural effusion. No pneumothorax. Increased opacity in the left retrocardiac region likely related to technique. Stable cardiomediastinal silhouette. No acute osseous abnormality. IMPRESSION: 1. Endotracheal tube with the tip 7 cm above the carina. 2. Nasogastric tube coursing below the diaphragm. 3. Bilateral diffuse mild interstitial thickening which may reflect mild interstitial edema versus infection. Electronically Signed   By: Kathreen Devoid   On: 02/02/2018 10:19   Dg Chest Port 1 View  Result Date: 01/31/2018 CLINICAL DATA:  Intubated EXAM: PORTABLE CHEST 1 VIEW COMPARISON:  Chest radiograph from earlier today. FINDINGS: Endotracheal tube tip is 14.0 cm above the carina. Enteric tube coils in cervical esophagus and enters the stomach with the  tip not seen on this image. Stable cardiomediastinal silhouette with normal heart size. No pneumothorax. No pleural effusion. No pulmonary edema. Mild left basilar scarring versus atelectasis. No acute consolidative airspace disease. IMPRESSION: 1. Endotracheal tube tip 14.0 cm above the carina, recommend advancing. 2. Enteric tube coils in the cervical esophagus and enters the stomach with the tip not seen on this image. 3. Mild left basilar scarring versus atelectasis. These results were called by telephone at the time of interpretation on 01/31/2018 at 6:30 pm to Dr. Noemi Chapel , who verbally acknowledged these results. Electronically Signed   By: Ilona Sorrel M.D.   On: 01/31/2018 18:32   Dg Chest Port 1 View  Result Date: 01/31/2018 CLINICAL DATA:  Unconscious patient. EXAM: PORTABLE CHEST 1 VIEW COMPARISON:  01/26/2018 FINDINGS: Lower portions of the thorax are excluded by collimation. Interval removal of dialysis  catheter. Mildly enlarged cardiac silhouette. Mediastinal contours appear intact. There is no evidence of focal airspace consolidation, pleural effusion or pneumothorax. Osseous structures are without acute abnormality. Soft tissues are grossly normal. IMPRESSION: Mildly enlarged cardiac silhouette. Otherwise no acute abnormalities, accounting for excluding of the lower thorax. Electronically Signed   By: Fidela Salisbury M.D.   On: 01/31/2018 17:49    Orson Eva, DO  [facility] Pager [phone]  If 7PM-7AM, please contact night-coverage [email or web address] Password [email or web address] 02/07/2018, 6:28 PM   LOS: 7 days

## 2018-02-07 NOTE — Progress Notes (Signed)
Pharmacy Antibiotic Note  Aaron [name] is a 62 y.o. male admitted on 01/31/2018 with HCAP pneumonia.  Pharmacy has been consulted for Vancomycin and zosyn dosing. Elevated Scr, not being dialyzed.   Plan: Vanco trough of 27 Will delay dose and then continue at Vancomycin 750 mg IV every 48 hours.  Goal trough 15-20 mcg/mL. Zosyn 3.375gm IV q12 hours EID over 4 hours Monitor labs, c/s, and levels as indicated.  Height: 6\' 4"  (193 cm) Weight: 184 lb 8.4 oz (83.7 kg) IBW/kg (Calculated) : 86.8  Temp (24hrs), Avg:98.3 F (36.8 C), Min:98 F (36.7 C), Max:98.8 F (37.1 C)  Recent Labs  Lab 01/31/18 1742 01/31/18 2258  02/02/18 0457 02/03/18 0355 02/04/18 0409 02/05/18 0409 02/06/18 0432 02/06/18 2133 02/07/18 0443  WBC  --   --   --  6.1 10.2 9.7 8.8 9.4  --   --   CREATININE 5.30* 4.61*   < > 4.53* 4.68*  --  4.27* 4.30*  --  4.25*  LATICACIDVEN 2.17* 1.1  --   --   --   --   --   --   --   --   VANCOTROUGH  --   --   --   --   --   --   --   --  27*  --    < > = values in this interval not displayed.    Estimated Creatinine Clearance: 21.6 mL/min (A) (by C-G formula based on SCr of 4.25 mg/dL (H)).   Normalized Cr Cl 68mls/min  No Known Allergies  Antimicrobials this admission: Vancomycin 10/20 >>  Zosyn 10/20>>   Dose adjustments this admission: N/A  Microbiology results: 10/20 BCx: ngtd 10/20 UCx: ng  Thank you for allowing pharmacy to be a part of this patient's care.  Margot Ables, PharmD Clinical Pharmacist 02/07/2018 2:11 PM

## 2018-02-07 NOTE — Progress Notes (Signed)
ANTICOAGULATION CONSULT NOTE - Follow up [place] for Heparin >> Lovenox>> Coumadin Indication: atrial fibrillation  No Known Allergies  Patient Measurements: Height: 6\' 4"  (193 cm) Weight: 184 lb 8.4 oz (83.7 kg) IBW/kg (Calculated) : 86.8 HEPARIN DW (KG): 85  Vital Signs: Temp: 98 F (36.7 C) (10/27 0727) Temp Source: Oral (10/27 0727) BP: 154/105 (10/27 1000) Pulse Rate: 108 (10/27 1000)  Labs: Recent Labs    02/05/18 0409 02/06/18 0432 02/07/18 0442 02/07/18 0443  HGB 9.3* 9.7*  --   --   HCT 29.7* 31.9*  --   --   PLT 320 339  --   --   LABPROT 16.8* 16.0* 17.3*  --   INR 1.38 1.30 1.43  --   CREATININE 4.27* 4.30*  --  4.25*    Estimated Creatinine Clearance: 21.6 mL/min (A) (by C-G formula based on SCr of 4.25 mg/dL (H)).  Assessment: 62 y.o. male, with history of anxiety, osteoarthritis, atrial fibrillation on chronic anticoagulation with Coumadin, CAD, chronic back pain, COPD, ESRD who was previously on hemodialysis, nephrology had stopped dialysis as patient had been noncompliant and started making urine as per note from previous hospitalization from 01/29/2018, dialysis catheter was removed.  Patient recently was admitted with sepsis due to paraspinal abscess status post drainage, lumbar discitis.  He was discharged home on ciprofloxacin and Augmentin for 6 weeks of therapy.  At that time he also had a TEE which was negative for vegetation. Patient has been on Coumadin for afib.   Transitioned from Heparin to SQ Lovenox. Now will add coumadin back since extubated and bridge until INR > 2  INR 1.43     Goal of Therapy:  INR 2-3 Monitor platelets by anticoagulation protocol: Yes   Plan:  Lovenox 1mg /kg SQ every 24 hours (adjusted to renal function) Coumadin 7.5mg  po x 1 today PT-INR daily Continue to monitor H&H and platelets  Thanks for allowing pharmacy to be a part of this patient's care.  Margot Ables, PharmD Clinical  Pharmacist 02/07/2018 10:02 AM

## 2018-02-07 NOTE — Progress Notes (Signed)
Subjective: Interval History: Patient complains of diarrhea 3 times last night.  He has also some abdominal pain.  Presently is feeling better.  He does not have any nausea or vomiting.  He has some chills.  Objective: Vital signs in last 24 hours: Temp:  [98 F (36.7 C)-98.8 F (37.1 C)] 98 F (36.7 C) (10/27 0727) Pulse Rate:  [95-134] 105 (10/27 0800) Resp:  [15-30] 19 (10/27 0800) BP: (151-182)/(102-125) 154/120 (10/27 0800) SpO2:  [94 %-99 %] 98 % (10/27 0815) Weight:  [83.7 kg] 83.7 kg (10/27 0500) Weight change: -1.6 kg  Intake/Output from previous day: 10/26 0701 - 10/27 0700 In: 162 [IV Piggyback:162] Out: 900 [Urine:900] Intake/Output this shift: No intake/output data recorded.  Patient is alert and in no apparent distress. Chest: Clear to auscultation, no wheezing. Heart exam revealed iregular rate and rhythm no murmur Abdomen: Soft positive bowel sounds Extremities no edema  Lab Results: Recent Labs    02/05/18 0409 02/06/18 0432  WBC 8.8 9.4  HGB 9.3* 9.7*  HCT 29.7* 31.9*  PLT 320 339   BMET:  Recent Labs    02/06/18 0432 02/07/18 0443  NA 138 138  K 3.5 3.6  CL 98 98  CO2 26 25  GLUCOSE 155* 93  BUN 45* 51*  CREATININE 4.30* 4.25*  CALCIUM 8.5* 8.4*   No results for input(s): PTH in the last 72 hours. Iron Studies:  No results for input(s): IRON, TIBC, TRANSFERRIN, FERRITIN in the last 72 hours.  Studies/Results: No results found.  I have reviewed the patient's current medications.  Assessment/Plan: 1] acute on chronic renal failure: His creatinine is 4.25.  Renal function remains to stable. 2] altered mental status: Has recovered. 3] respiratory failure: Patient with history of COPD.  Denies any difficulty breathing. 4] bone and mineral disorder: Calcium and phosphorus is range 5] anemia: His hemoglobin is low but progressively improving. 6] hypertension: His blood pressure is fluctuating.  Is somewhat high.  Presently he is on  metoprolol 50 mg p.o. twice daily and Cardizem 360 mg p.o. daily. 7] history of paroxysmal atrial tachycardia: His heart rate is controlled 8] metabolic acidosis: Has corrected. 9] diarrhea: Etiology at this moment not clear.  He is improving this morning.. Plan: 1] we start on hydralazine 50 mg p.o. 3 times daily 2] renal panel and CBC in the morning 3] if diarrhea continues possibly checking for C. difficile and bacteria may be reasonable as patient is on antibiotics. .   LOS: 7 days   Jesaiah Fabiano S 02/07/2018,9:31 AM

## 2018-02-08 LAB — CBC
HEMATOCRIT: 31.4 % — AB (ref 39.0–52.0)
Hemoglobin: 9.6 g/dL — ABNORMAL LOW (ref 13.0–17.0)
MCH: 29.1 pg (ref 26.0–34.0)
MCHC: 30.6 g/dL (ref 30.0–36.0)
MCV: 95.2 fL (ref 80.0–100.0)
NRBC: 0 % (ref 0.0–0.2)
Platelets: 325 10*3/uL (ref 150–400)
RBC: 3.3 MIL/uL — AB (ref 4.22–5.81)
RDW: 18.7 % — AB (ref 11.5–15.5)
WBC: 9 10*3/uL (ref 4.0–10.5)

## 2018-02-08 LAB — RENAL FUNCTION PANEL
ALBUMIN: 3 g/dL — AB (ref 3.5–5.0)
Anion gap: 14 (ref 5–15)
BUN: 59 mg/dL — AB (ref 8–23)
CHLORIDE: 100 mmol/L (ref 98–111)
CO2: 25 mmol/L (ref 22–32)
Calcium: 8.3 mg/dL — ABNORMAL LOW (ref 8.9–10.3)
Creatinine, Ser: 4.67 mg/dL — ABNORMAL HIGH (ref 0.61–1.24)
GFR calc Af Amer: 14 mL/min — ABNORMAL LOW (ref >60)
GFR, EST NON AFRICAN AMERICAN: 12 mL/min — AB (ref >60)
Glucose, Bld: 104 mg/dL — ABNORMAL HIGH (ref 70–99)
PHOSPHORUS: 4.2 mg/dL (ref 2.5–4.6)
POTASSIUM: 3.5 mmol/L (ref 3.5–5.1)
Sodium: 139 mmol/L (ref 135–145)

## 2018-02-08 LAB — PROTIME-INR
INR: 1.66
Prothrombin Time: 19.4 seconds — ABNORMAL HIGH (ref 11.4–15.2)

## 2018-02-08 MED ORDER — FUROSEMIDE 40 MG PO TABS: 40.0000 mg | ORAL_TABLET | Freq: Every day | ORAL | 1 refills | Status: DC

## 2018-02-08 MED ORDER — OXYCODONE-ACETAMINOPHEN 10-325 MG PO TABS: 1.0000 | ORAL_TABLET | Freq: Four times a day (QID) | ORAL | 0 refills | Status: DC | PRN

## 2018-02-08 MED ORDER — DILTIAZEM HCL ER COATED BEADS 360 MG PO CP24: 360.0000 mg | ORAL_CAPSULE | Freq: Every day | ORAL | 1 refills | Status: DC

## 2018-02-08 MED ORDER — WARFARIN SODIUM 7.5 MG PO TABS
7.5000 mg | ORAL_TABLET | Freq: Once | ORAL | Status: AC
  Administered 2018-02-08: 7.5 mg via ORAL
  Filled 2018-02-08: qty 1

## 2018-02-08 MED ORDER — HYDRALAZINE HCL 50 MG PO TABS: 50.0000 mg | ORAL_TABLET | Freq: Three times a day (TID) | ORAL | 1 refills | Status: DC

## 2018-02-08 MED ORDER — METOPROLOL TARTRATE 100 MG PO TABS: 100.0000 mg | ORAL_TABLET | Freq: Two times a day (BID) | ORAL | 1 refills | Status: DC

## 2018-02-08 NOTE — Clinical Social Work Note (Signed)
LCSW following. Spoke with Gerald Stabs from [facility] this AM. He states that they will accept pt and will work on Ship broker. Per MD, pt will get a PICC line today as he needs four weeks IV antibiotic therapy. Pt can dc once he has insurance authorization. Will follow and assist with dc needs.

## 2018-02-08 NOTE — Care Management Important Message (Signed)
Important Message  Patient Details  Name: Aaron [name] MRN: [id number] Date of Birth: [birth year]   Medicare Important Message Given:  Yes    Shelda Altes 02/08/2018, 12:38 PM

## 2018-02-08 NOTE — Discharge Summary (Addendum)
Physician Discharge Summary  Aaron [name] YKD:[id number] DOB: 02-Feb-1956 DOA: 01/31/2018  PCP: [facility] [place] date: 01/31/2018 Discharge date: 02/08/2018  Admitted From: Home Disposition:  Home   Recommendations for Outpatient Follow-up:  1. Follow up with PCP in 1-2 weeks 2. Please obtain BMP/CBC in one week   Home Health: patient refused  Discharge Condition: Stable CODE STATUS: FULL Diet recommendation: Heart Healthy   Brief/Interim Summary: 62 year old with a history of anxiety, osteoarthritis, atrial fibrillation, CAD, chronic back pain,lumbar discitis and paraspinal abscessCOPD, ESRDpreviouslyon hemodialysis, GERD, hepatitis C, essential hypertension, PAD, medical noncompliance came to the ERwhen he was found by his family unconscious on the floor covered in his own stool and urine. The patient apparently had agonal respirations with oxygen saturation 80% range. He was intubated for airway protection. The patient was recently admitted to the hospital from 01/26/2018 through 01/29/2018 during which the patient refused dialysis and was subsequently taken off of dialysis by nephrology. His dialysis catheter was removed at that time. The patient has had a long history of noncompliance including leaving AMA from the hospitalization 01/09/2018 through 01/13/2018.The patient was most recently discharged on Augmentin and ciprofloxacin for his paraspinal abscess. The patient was followed by nephrology during the hospitalization.  His doses of diuretics and fluids were adjusted.  Initially, the patient was agreeable to go to skilled nursing facility to receive intravenous antibiotics and physical therapy.  However, on the morning of 01/31/2018, the patient change his mind and want to go home.  This was discussed with the patient's family who understood the patient's decision and stated that the patient makes his own decisions.  Given the patient's  history of poor compliance with therapy, he will be discharged home with amox/clav and Cipro for 5 additional weeks to finish 6 weeks of therapy for his paraspinal abscess and vertebral discitis.  He will follow-up with nephrology in the office in 2 to 3 weeks.  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 and hypnotics -Patient stated that he took some of his sister's "tranquilizers"and suboxone -Intubated in the emergency department -Extubated 02/02/2018 -weanedoxygen off   Acute metabolic and toxic encephalopathy -secondary to opioid overuse -The [facility] Controlled Substance Reporting System has been queried for this patient--the patient received Percocet 10 mg #120 on 12/29/2017 and subsequently an additional 46 Percocet filled since that time -pt mental status now back to baseline -UDS--positive opiates -will not prescribe any opioids at time of discharge  CKD stage V -Nephrology following -Patient has refused dialysis;he was poorly compliant not going to his outpatient dialysis center -bicarbonate drip per nephrology--started 02/02/2018-->stopped 02/04/18 -IVF per renal -Left arm AV fistula ligated secondary to bleeding 12/2017 -Continue furosemide per nephrology  Lumbar discitis and paraspinal abscess -01/10/2018 drainage--bacillus species -Was previously on vancomycin and ceftazidime when he was on dialysis -initially on vancomycin and zosyn -d/c zosyn -started ceftriaxone 2 g IV q 12 -ESR--119 and CRP--4.1  -give patient's long hx of poor compliance with tx, plan to d/c home with previous regimen-->amox/clav and cipro x 5 more weeks, stop date 03/15/18 -pt already has rx from previous discharge on 01/29/18 which he did not take  COPD Exacerbation -Continueduonebs -continuepulmicort -short course prednisone--finished 5 day course  Elevated troponin -Secondary to ESRD -Trend  is flat -No chest pain presently -Personally reviewed EKG--atrial fibrillation, nonspecific T wave changes  Chronic atrial fibrillationwith RVR -increaseddiltiazem360 mg daily -increase metoprolol to 100 mg bid -01/12/2018 TEE--EF  50-55%, no vegetation, mild MR, HK anterior septal -INR was subtherapeutic at the time of admission -was initially on IV heparin/[place] enoxaparin earlier in the admission -restart warfarin  Essential hypertension -Previously on carvedilol prior to admission -Continue IV Lopressor for now>>>transition to po    Discharge Instructions   Allergies as of 02/08/2018   No Known Allergies     Medication List    STOP taking these medications   carvedilol 25 MG tablet Commonly known as:  COREG   oxyCODONE-acetaminophen 10-325 MG tablet Commonly known as:  PERCOCET     TAKE these medications   albuterol 108 (90 Base) MCG/ACT inhaler Commonly known as:  PROVENTIL HFA;VENTOLIN HFA Inhale 2 puffs into the lungs every 6 (six) hours as needed for wheezing or shortness of breath.   amoxicillin-clavulanate 500-125 MG tablet Commonly known as:  AUGMENTIN Take 1 tablet (500 mg total) by mouth every 12 (twelve) hours.   BREO ELLIPTA 200-25 MCG/INH Aepb Generic drug:  fluticasone furoate-vilanterol Inhale 1 puff into the lungs every evening.   cholecalciferol 1000 units tablet Commonly known as:  VITAMIN D Take 2,000 Units by mouth daily.   ciprofloxacin 500 MG tablet Commonly known as:  CIPRO Take 1 tablet (500 mg total) by mouth daily.   clopidogrel 75 MG tablet Commonly known as:  PLAVIX Take 75 mg by mouth daily.   diltiazem 360 MG 24 hr capsule Commonly known as:  CARDIZEM CD Take 1 capsule (360 mg total) by mouth daily. Start taking on:  02/09/2018   fluticasone 50 MCG/ACT nasal spray Commonly known as:  FLONASE Place 1 spray into both nostrils daily.   furosemide 40 MG tablet Commonly known as:  LASIX Take 1 tablet (40 mg total) by  mouth daily. Start taking on:  02/09/2018 What changed:    medication strength  how much to take   hydrALAZINE 50 MG tablet Commonly known as:  APRESOLINE Take 1 tablet (50 mg total) by mouth every 8 (eight) hours.   metoprolol tartrate 100 MG tablet Commonly known as:  LOPRESSOR Take 1 tablet (100 mg total) by mouth 2 (two) times daily.   pantoprazole 40 MG tablet Commonly known as:  PROTONIX Take 1 tablet (40 mg total) by mouth 2 (two) times daily.   traZODone 50 MG tablet Commonly known as:  DESYREL Take 50 mg by mouth at bedtime.   warfarin 5 MG tablet Commonly known as:  COUMADIN Take as directed. If you are unsure how to take this medication, talk to your nurse or doctor. Original instructions:  Take 1 tablet (5 mg total) by mouth daily at 6 PM.       Contact information for follow-up providers    Fran Lowes, MD Follow up in 3 week(s).   Specialty:  Nephrology Contact information: [address] [address] [phone]            Contact information for after-discharge care    [facility] .   Service:  Skilled Nursing Contact information: [address] [place] [phone]                 No Known Allergies  Consultations:  nephrology   Procedures/Studies: Dg Chest 2 View  Result Date: 01/26/2018 CLINICAL DATA:  Shortness of breath EXAM: CHEST - 2 VIEW COMPARISON:  01/09/2018, 12/04/2017 FINDINGS: Right-sided central venous catheter tip over the SVC. Bibasilar atelectasis or scarring. No pleural effusion. No focal  consolidation. Stable cardiomediastinal silhouette with aortic atherosclerosis. No pneumothorax. IMPRESSION: No active cardiopulmonary disease. Stable scarring and/or atelectasis at both lung bases. Electronically Signed   By: Donavan Foil M.D.   On: 01/26/2018 23:23   Dg Lumbar Spine 2-3 Views  Result Date: 01/28/2018 CLINICAL DATA:   Discitis EXAM: LUMBAR SPINE - 2-3 VIEW COMPARISON:  MRI from 01/06/2018 FINDINGS: Five lumbar type vertebral bodies are well visualized. Vertebral body height is well maintained at L1, L3-L4 and L5. Interbody fusion at L2-3 and L3-4 is seen. Increase kyphosis at L1-2 is noted related to interval superior endplate collapse at L2. MRI may be helpful for further evaluation. Multilevel osteophytic changes are noted. Diffuse vascular calcifications with right iliac stenting are noted. IMPRESSION: Progressive kyphosis at L1-2 related to superior endplate compression of L2. MRI may be helpful for further evaluation. Electronically Signed   By: Inez Catalina M.D.   On: 01/28/2018 14:12   Ct Head Wo Contrast  Result Date: 01/31/2018 CLINICAL DATA:  Found unresponsive EXAM: CT HEAD WITHOUT CONTRAST CT CERVICAL SPINE WITHOUT CONTRAST TECHNIQUE: Multidetector CT imaging of the head and cervical spine was performed following the standard protocol without intravenous contrast. Multiplanar CT image reconstructions of the cervical spine were also generated. COMPARISON:  12/25/2017 FINDINGS: CT HEAD FINDINGS Brain: There is atrophy and chronic small vessel disease changes. No acute intracranial abnormality. Specifically, no hemorrhage, hydrocephalus, mass lesion, acute infarction, or significant intracranial injury. Vascular: No hyperdense vessel or unexpected calcification. Skull: No acute calvarial abnormality. Sinuses/Orbits: Diffuse mucosal thickening throughout the ethmoid air cells and right sphenoid sinus. Mastoid air cells clear. No orbital soft tissue abnormality. Other: None CT CERVICAL SPINE FINDINGS Alignment: No subluxation Skull base and vertebrae: No acute fracture. No primary bone lesion or focal pathologic process. Soft tissues and spinal canal: No prevertebral fluid or swelling. No visible canal hematoma. Disc levels: Prior anterior fusion from C3-C5. Diffuse degenerative disc and facet disease. Upper chest:  Emphysematous changes in the apices. No acute findings. Other: Carotid artery calcifications bilaterally. IMPRESSION: No acute intracranial abnormality. Atrophy, chronic microvascular disease. Prior anterior fusion from C3-C5. Diffuse degenerative disc and facet disease in the cervical spine. No acute bony abnormality. Electronically Signed   By: Rolm Baptise M.D.   On: 01/31/2018 19:42   Ct Cervical Spine Wo Contrast  Result Date: 01/31/2018 CLINICAL DATA:  Found unresponsive EXAM: CT HEAD WITHOUT CONTRAST CT CERVICAL SPINE WITHOUT CONTRAST TECHNIQUE: Multidetector CT imaging of the head and cervical spine was performed following the standard protocol without intravenous contrast. Multiplanar CT image reconstructions of the cervical spine were also generated. COMPARISON:  12/25/2017 FINDINGS: CT HEAD FINDINGS Brain: There is atrophy and chronic small vessel disease changes. No acute intracranial abnormality. Specifically, no hemorrhage, hydrocephalus, mass lesion, acute infarction, or significant intracranial injury. Vascular: No hyperdense vessel or unexpected calcification. Skull: No acute calvarial abnormality. Sinuses/Orbits: Diffuse mucosal thickening throughout the ethmoid air cells and right sphenoid sinus. Mastoid air cells clear. No orbital soft tissue abnormality. Other: None CT CERVICAL SPINE FINDINGS Alignment: No subluxation Skull base and vertebrae: No acute fracture. No primary bone lesion or focal pathologic process. Soft tissues and spinal canal: No prevertebral fluid or swelling. No visible canal hematoma. Disc levels: Prior anterior fusion from C3-C5. Diffuse degenerative disc and facet disease. Upper chest: Emphysematous changes in the apices. No acute findings. Other: Carotid artery calcifications bilaterally. IMPRESSION: No acute intracranial abnormality. Atrophy, chronic microvascular disease. Prior anterior fusion from C3-C5. Diffuse degenerative disc and facet disease in  the cervical  spine. No acute bony abnormality. Electronically Signed   By: Rolm Baptise M.D.   On: 01/31/2018 19:42   Ct Aspiration  Result Date: 01/10/2018 INDICATION: L1-2 and L4-5 discitis.  Left L2 psoas small abscess. EXAM: CT-GUIDED ASPIRATION MEDICATIONS: The patient is currently admitted to the hospital and receiving intravenous antibiotics. The antibiotics were administered within an appropriate time frame prior to the initiation of the procedure. ANESTHESIA/SEDATION: Fentanyl 50 mcg IV; Versed 1.5 mg IV Moderate Sedation Time:  10 minutes The patient was continuously monitored during the procedure by the interventional radiology nurse under my direct supervision. COMPLICATIONS: None immediate. PROCEDURE: Informed written consent was obtained from the patient after a thorough discussion of the procedural risks, benefits and alternatives. All questions were addressed. Maximal Sterile Barrier Technique was utilized including caps, mask, sterile gowns, sterile gloves, sterile drape, hand hygiene and skin antiseptic. A timeout was performed prior to the initiation of the procedure. The back was prepped and draped in a sterile fashion. 1% lidocaine was utilized for local anesthesia. Under CT guidance, an 18 gauge needle was inserted into the left L2 paraspinal fluid collection. Aspiration yielded 2 drops bloody fluid. FINDINGS: Imaging demonstrates needle placement in the left L2 paraspinal fluid collection. IMPRESSION: Successful left paraspinal L2 aspiration yielding 2 drops bloody fluid. Electronically Signed   By: Marybelle Killings M.D.   On: 01/10/2018 16:00   US Venous Img Upper Uni Right  Result Date: 02/03/2018 CLINICAL DATA:  Right upper extremity pain and edema. History of recent dialysis catheter removal. Evaluate for DVT. EXAM: RIGHT UPPER EXTREMITY VENOUS DOPPLER ULTRASOUND TECHNIQUE: Gray-scale sonography with graded compression, as well as color Doppler and duplex ultrasound were performed to evaluate the  upper extremity deep venous system from the level of the subclavian vein and including the jugular, axillary, basilic, radial, ulnar and upper cephalic vein. Spectral Doppler was utilized to evaluate flow at rest and with distal augmentation maneuvers. COMPARISON:  None. FINDINGS: Contralateral Subclavian Vein: Respiratory phasicity is normal and symmetric with the symptomatic side. No evidence of thrombus. Normal compressibility. Internal Jugular Vein: No evidence of thrombus. Normal compressibility, respiratory phasicity and response to augmentation. Subclavian Vein: No evidence of thrombus. Normal compressibility, respiratory phasicity and response to augmentation. Axillary Vein: No evidence of thrombus. Normal compressibility, respiratory phasicity and response to augmentation. Cephalic Vein: No evidence of thrombus. Normal compressibility, respiratory phasicity and response to augmentation. Basilic Vein: No evidence of thrombus. Normal compressibility, respiratory phasicity and response to augmentation. Brachial Veins: No evidence of thrombus. Normal compressibility, respiratory phasicity and response to augmentation. Radial Veins: No evidence of thrombus. Normal compressibility, respiratory phasicity and response to augmentation. Ulnar Veins: No evidence of thrombus. Normal compressibility, respiratory phasicity and response to augmentation. Venous Reflux:  None visualized. Other Findings:  None visualized. IMPRESSION: No evidence of DVT within the right upper extremity. Electronically Signed   By: Sandi Mariscal M.D.   On: 02/03/2018 15:27   Dg Chest Port 1 View  Result Date: 02/02/2018 CLINICAL DATA:  Bilateral inspiratory rales EXAM: PORTABLE CHEST 1 VIEW COMPARISON:  01/31/2018 FINDINGS: Endotracheal tube with the tip 7 cm above the carina. Nasogastric tube coursing below the diaphragm. Mild bilateral interstitial thickening. Possible trace left pleural effusion. No pneumothorax. Increased opacity in the  left retrocardiac region likely related to technique. Stable cardiomediastinal silhouette. No acute osseous abnormality. IMPRESSION: 1. Endotracheal tube with the tip 7 cm above the carina. 2. Nasogastric tube coursing below the diaphragm. 3. Bilateral diffuse mild interstitial  thickening which may reflect mild interstitial edema versus infection. Electronically Signed   By: Kathreen Devoid   On: 02/02/2018 10:19   Dg Chest Port 1 View  Result Date: 01/31/2018 CLINICAL DATA:  Intubated EXAM: PORTABLE CHEST 1 VIEW COMPARISON:  Chest radiograph from earlier today. FINDINGS: Endotracheal tube tip is 14.0 cm above the carina. Enteric tube coils in cervical esophagus and enters the stomach with the tip not seen on this image. Stable cardiomediastinal silhouette with normal heart size. No pneumothorax. No pleural effusion. No pulmonary edema. Mild left basilar scarring versus atelectasis. No acute consolidative airspace disease. IMPRESSION: 1. Endotracheal tube tip 14.0 cm above the carina, recommend advancing. 2. Enteric tube coils in the cervical esophagus and enters the stomach with the tip not seen on this image. 3. Mild left basilar scarring versus atelectasis. These results were called by telephone at the time of interpretation on 01/31/2018 at 6:30 pm to Dr. Noemi Chapel , who verbally acknowledged these results. Electronically Signed   By: Ilona Sorrel M.D.   On: 01/31/2018 18:32   Dg Chest Port 1 View  Result Date: 01/31/2018 CLINICAL DATA:  Unconscious patient. EXAM: PORTABLE CHEST 1 VIEW COMPARISON:  01/26/2018 FINDINGS: Lower portions of the thorax are excluded by collimation. Interval removal of dialysis catheter. Mildly enlarged cardiac silhouette. Mediastinal contours appear intact. There is no evidence of focal airspace consolidation, pleural effusion or pneumothorax. Osseous structures are without acute abnormality. Soft tissues are grossly normal. IMPRESSION: Mildly enlarged cardiac silhouette.  Otherwise no acute abnormalities, accounting for excluding of the lower thorax. Electronically Signed   By: Fidela Salisbury M.D.   On: 01/31/2018 17:49        Discharge Exam: Vitals:   02/08/18 1500 02/08/18 1555  BP: 139/86   Pulse: 79   Resp: 15   Temp:    SpO2: 92% 93%   Vitals:   02/08/18 1400 02/08/18 1402 02/08/18 1500 02/08/18 1555  BP: (!) 149/91 (!) 149/91 139/86   Pulse: 91  79   Resp: 15  15   Temp:      TempSrc:      SpO2: 91%  92% 93%  Weight:      Height:        General: Pt is alert, awake, not in acute distress Cardiovascular: IRRR, S1/S2 +, no rubs, no gallops Respiratory: bibasilar rales, no wheeze Abdominal: Soft, NT, ND, bowel sounds + Extremities: trace LE edema, no cyanosis   The results of significant diagnostics from this hospitalization (including imaging, microbiology, ancillary and laboratory) are listed below for reference.    Significant Diagnostic Studies: Dg Chest 2 View  Result Date: 01/26/2018 CLINICAL DATA:  Shortness of breath EXAM: CHEST - 2 VIEW COMPARISON:  01/09/2018, 12/04/2017 FINDINGS: Right-sided central venous catheter tip over the SVC. Bibasilar atelectasis or scarring. No pleural effusion. No focal consolidation. Stable cardiomediastinal silhouette with aortic atherosclerosis. No pneumothorax. IMPRESSION: No active cardiopulmonary disease. Stable scarring and/or atelectasis at both lung bases. Electronically Signed   By: Donavan Foil M.D.   On: 01/26/2018 23:23   Dg Lumbar Spine 2-3 Views  Result Date: 01/28/2018 CLINICAL DATA:  Discitis EXAM: LUMBAR SPINE - 2-3 VIEW COMPARISON:  MRI from 01/06/2018 FINDINGS: Five lumbar type vertebral bodies are well visualized. Vertebral body height is well maintained at L1, L3-L4 and L5. Interbody fusion at L2-3 and L3-4 is seen. Increase kyphosis at L1-2 is noted related to interval superior endplate collapse at L2. MRI may be helpful for further evaluation. Multilevel  osteophytic  changes are noted. Diffuse vascular calcifications with right iliac stenting are noted. IMPRESSION: Progressive kyphosis at L1-2 related to superior endplate compression of L2. MRI may be helpful for further evaluation. Electronically Signed   By: Inez Catalina M.D.   On: 01/28/2018 14:12   Ct Head Wo Contrast  Result Date: 01/31/2018 CLINICAL DATA:  Found unresponsive EXAM: CT HEAD WITHOUT CONTRAST CT CERVICAL SPINE WITHOUT CONTRAST TECHNIQUE: Multidetector CT imaging of the head and cervical spine was performed following the standard protocol without intravenous contrast. Multiplanar CT image reconstructions of the cervical spine were also generated. COMPARISON:  12/25/2017 FINDINGS: CT HEAD FINDINGS Brain: There is atrophy and chronic small vessel disease changes. No acute intracranial abnormality. Specifically, no hemorrhage, hydrocephalus, mass lesion, acute infarction, or significant intracranial injury. Vascular: No hyperdense vessel or unexpected calcification. Skull: No acute calvarial abnormality. Sinuses/Orbits: Diffuse mucosal thickening throughout the ethmoid air cells and right sphenoid sinus. Mastoid air cells clear. No orbital soft tissue abnormality. Other: None CT CERVICAL SPINE FINDINGS Alignment: No subluxation Skull base and vertebrae: No acute fracture. No primary bone lesion or focal pathologic process. Soft tissues and spinal canal: No prevertebral fluid or swelling. No visible canal hematoma. Disc levels: Prior anterior fusion from C3-C5. Diffuse degenerative disc and facet disease. Upper chest: Emphysematous changes in the apices. No acute findings. Other: Carotid artery calcifications bilaterally. IMPRESSION: No acute intracranial abnormality. Atrophy, chronic microvascular disease. Prior anterior fusion from C3-C5. Diffuse degenerative disc and facet disease in the cervical spine. No acute bony abnormality. Electronically Signed   By: Rolm Baptise M.D.   On: 01/31/2018 19:42   Ct  Cervical Spine Wo Contrast  Result Date: 01/31/2018 CLINICAL DATA:  Found unresponsive EXAM: CT HEAD WITHOUT CONTRAST CT CERVICAL SPINE WITHOUT CONTRAST TECHNIQUE: Multidetector CT imaging of the head and cervical spine was performed following the standard protocol without intravenous contrast. Multiplanar CT image reconstructions of the cervical spine were also generated. COMPARISON:  12/25/2017 FINDINGS: CT HEAD FINDINGS Brain: There is atrophy and chronic small vessel disease changes. No acute intracranial abnormality. Specifically, no hemorrhage, hydrocephalus, mass lesion, acute infarction, or significant intracranial injury. Vascular: No hyperdense vessel or unexpected calcification. Skull: No acute calvarial abnormality. Sinuses/Orbits: Diffuse mucosal thickening throughout the ethmoid air cells and right sphenoid sinus. Mastoid air cells clear. No orbital soft tissue abnormality. Other: None CT CERVICAL SPINE FINDINGS Alignment: No subluxation Skull base and vertebrae: No acute fracture. No primary bone lesion or focal pathologic process. Soft tissues and spinal canal: No prevertebral fluid or swelling. No visible canal hematoma. Disc levels: Prior anterior fusion from C3-C5. Diffuse degenerative disc and facet disease. Upper chest: Emphysematous changes in the apices. No acute findings. Other: Carotid artery calcifications bilaterally. IMPRESSION: No acute intracranial abnormality. Atrophy, chronic microvascular disease. Prior anterior fusion from C3-C5. Diffuse degenerative disc and facet disease in the cervical spine. No acute bony abnormality. Electronically Signed   By: Rolm Baptise M.D.   On: 01/31/2018 19:42   Ct Aspiration  Result Date: 01/10/2018 INDICATION: L1-2 and L4-5 discitis.  Left L2 psoas small abscess. EXAM: CT-GUIDED ASPIRATION MEDICATIONS: The patient is currently admitted to the hospital and receiving intravenous antibiotics. The antibiotics were administered within an appropriate  time frame prior to the initiation of the procedure. ANESTHESIA/SEDATION: Fentanyl 50 mcg IV; Versed 1.5 mg IV Moderate Sedation Time:  10 minutes The patient was continuously monitored during the procedure by the interventional radiology nurse under my direct supervision. COMPLICATIONS: None immediate. PROCEDURE: Informed written consent  was obtained from the patient after a thorough discussion of the procedural risks, benefits and alternatives. All questions were addressed. Maximal Sterile Barrier Technique was utilized including caps, mask, sterile gowns, sterile gloves, sterile drape, hand hygiene and skin antiseptic. A timeout was performed prior to the initiation of the procedure. The back was prepped and draped in a sterile fashion. 1% lidocaine was utilized for local anesthesia. Under CT guidance, an 18 gauge needle was inserted into the left L2 paraspinal fluid collection. Aspiration yielded 2 drops bloody fluid. FINDINGS: Imaging demonstrates needle placement in the left L2 paraspinal fluid collection. IMPRESSION: Successful left paraspinal L2 aspiration yielding 2 drops bloody fluid. Electronically Signed   By: Marybelle Killings M.D.   On: 01/10/2018 16:00   US Venous Img Upper Uni Right  Result Date: 02/03/2018 CLINICAL DATA:  Right upper extremity pain and edema. History of recent dialysis catheter removal. Evaluate for DVT. EXAM: RIGHT UPPER EXTREMITY VENOUS DOPPLER ULTRASOUND TECHNIQUE: Gray-scale sonography with graded compression, as well as color Doppler and duplex ultrasound were performed to evaluate the upper extremity deep venous system from the level of the subclavian vein and including the jugular, axillary, basilic, radial, ulnar and upper cephalic vein. Spectral Doppler was utilized to evaluate flow at rest and with distal augmentation maneuvers. COMPARISON:  None. FINDINGS: Contralateral Subclavian Vein: Respiratory phasicity is normal and symmetric with the symptomatic side. No evidence  of thrombus. Normal compressibility. Internal Jugular Vein: No evidence of thrombus. Normal compressibility, respiratory phasicity and response to augmentation. Subclavian Vein: No evidence of thrombus. Normal compressibility, respiratory phasicity and response to augmentation. Axillary Vein: No evidence of thrombus. Normal compressibility, respiratory phasicity and response to augmentation. Cephalic Vein: No evidence of thrombus. Normal compressibility, respiratory phasicity and response to augmentation. Basilic Vein: No evidence of thrombus. Normal compressibility, respiratory phasicity and response to augmentation. Brachial Veins: No evidence of thrombus. Normal compressibility, respiratory phasicity and response to augmentation. Radial Veins: No evidence of thrombus. Normal compressibility, respiratory phasicity and response to augmentation. Ulnar Veins: No evidence of thrombus. Normal compressibility, respiratory phasicity and response to augmentation. Venous Reflux:  None visualized. Other Findings:  None visualized. IMPRESSION: No evidence of DVT within the right upper extremity. Electronically Signed   By: Sandi Mariscal M.D.   On: 02/03/2018 15:27   Dg Chest Port 1 View  Result Date: 02/02/2018 CLINICAL DATA:  Bilateral inspiratory rales EXAM: PORTABLE CHEST 1 VIEW COMPARISON:  01/31/2018 FINDINGS: Endotracheal tube with the tip 7 cm above the carina. Nasogastric tube coursing below the diaphragm. Mild bilateral interstitial thickening. Possible trace left pleural effusion. No pneumothorax. Increased opacity in the left retrocardiac region likely related to technique. Stable cardiomediastinal silhouette. No acute osseous abnormality. IMPRESSION: 1. Endotracheal tube with the tip 7 cm above the carina. 2. Nasogastric tube coursing below the diaphragm. 3. Bilateral diffuse mild interstitial thickening which may reflect mild interstitial edema versus infection. Electronically Signed   By: Kathreen Devoid   On:  02/02/2018 10:19   Dg Chest Port 1 View  Result Date: 01/31/2018 CLINICAL DATA:  Intubated EXAM: PORTABLE CHEST 1 VIEW COMPARISON:  Chest radiograph from earlier today. FINDINGS: Endotracheal tube tip is 14.0 cm above the carina. Enteric tube coils in cervical esophagus and enters the stomach with the tip not seen on this image. Stable cardiomediastinal silhouette with normal heart size. No pneumothorax. No pleural effusion. No pulmonary edema. Mild left basilar scarring versus atelectasis. No acute consolidative airspace disease. IMPRESSION: 1. Endotracheal tube tip 14.0 cm above the  carina, recommend advancing. 2. Enteric tube coils in the cervical esophagus and enters the stomach with the tip not seen on this image. 3. Mild left basilar scarring versus atelectasis. These results were called by telephone at the time of interpretation on 01/31/2018 at 6:30 pm to Dr. Noemi Chapel , who verbally acknowledged these results. Electronically Signed   By: Ilona Sorrel M.D.   On: 01/31/2018 18:32   Dg Chest Port 1 View  Result Date: 01/31/2018 CLINICAL DATA:  Unconscious patient. EXAM: PORTABLE CHEST 1 VIEW COMPARISON:  01/26/2018 FINDINGS: Lower portions of the thorax are excluded by collimation. Interval removal of dialysis catheter. Mildly enlarged cardiac silhouette. Mediastinal contours appear intact. There is no evidence of focal airspace consolidation, pleural effusion or pneumothorax. Osseous structures are without acute abnormality. Soft tissues are grossly normal. IMPRESSION: Mildly enlarged cardiac silhouette. Otherwise no acute abnormalities, accounting for excluding of the lower thorax. Electronically Signed   By: Fidela Salisbury M.D.   On: 01/31/2018 17:49     Microbiology: Recent Results (from the past 240 hour(s))  Urine Culture     Status: None   Collection Time: 01/31/18  5:26 PM  Result Value Ref Range Status   Specimen Description   Final    URINE, CATHETERIZED Performed at  [facility], [address] [place], [place] [address]    Special Requests   Final    NONE Performed at [facility], [address] [place], [place] [address]    Culture   Final    NO GROWTH Performed at [facility], [place] [address], [place], [place] [address]    Report Status 02/02/2018 FINAL  Final  Culture, blood (Routine X 2) w Reflex to ID Panel     Status: None   Collection Time: 01/31/18  7:05 PM  Result Value Ref Range Status   Specimen Description   Final    BLOOD RIGHT HAND BOTTLES DRAWN AEROBIC ONLY Blood Culture adequate volume   Special Requests NONE  Final   Culture   Final    NO GROWTH 5 DAYS Performed at [facility], [address], [place], [place] [address]    Report Status 02/05/2018 FINAL  Final  Culture, blood (Routine X 2) w Reflex to ID Panel     Status: None   Collection Time: 01/31/18  7:10 PM  Result Value Ref Range Status   Specimen Description   Final    BLOOD LEFT HAND Blood Culture adequate volume BOTTLES DRAWN AEROBIC ONLY   Special Requests NONE  Final   Culture   Final    NO GROWTH 5 DAYS Performed at [facility], [address], [place],  [address]    Report Status 02/05/2018 FINAL  Final     Labs: Basic Metabolic Panel: Recent Labs  Lab 02/03/18 0355 02/05/18 0409 02/06/18 0432 02/07/18 0442 02/07/18 0443 02/08/18 0413  NA 139 138 138  --  138 139  K 3.6 3.9 3.5  --  3.6 3.5  CL 109 97* 98  --  98 100  CO2 21* 27 26  --  25 25  GLUCOSE 91 124* 155*  --  93 104*  BUN 44* 39* 45*  --  51* 59*  CREATININE 4.68* 4.27* 4.30*  --  4.25* 4.67*  CALCIUM 8.4* 8.4* 8.5*  --  8.4* 8.3*  MG  --   --   --  1.9  --   --   PHOS 5.1* 5.1* 5.0*  --  4.9* 4.2   Liver Function Tests: Recent Labs  Lab 02/03/18 0355 02/05/18 0409 02/06/18 0432 02/07/18 0443 02/08/18 0413  ALBUMIN 2.7* 2.8* 3.0* 3.1* 3.0*   No results for input(s): LIPASE, AMYLASE in the last 168 hours. No results for input(s): AMMONIA in the last 168  hours. CBC: Recent Labs  Lab 02/03/18 0355 02/04/18 0409 02/05/18 0409 02/06/18 0432 02/08/18 0413  WBC 10.2 9.7 8.8 9.4 9.0  HGB 9.6* 9.6* 9.3* 9.7* 9.6*  HCT 30.3* 30.8* 29.7* 31.9* 31.4*  MCV 96.5 97.5 94.3 95.2 95.2  PLT 339 270 320 339 325   Cardiac Enzymes: No results for input(s): CKTOTAL, CKMB, CKMBINDEX, TROPONINI in the last 168 hours. BNP: Invalid input(s): POCBNP CBG: Recent Labs  Lab 02/01/18 2354 02/02/18 0405 02/02/18 0723 02/02/18 1132 02/03/18 0752  GLUCAP 77 84 85 91 98    Time coordinating discharge:  36 minutes  Signed:  Orson Eva, DO [facility] Pager: [phone] 02/08/2018, 4:20 PM

## 2018-02-08 NOTE — Progress Notes (Signed)
ANTICOAGULATION CONSULT NOTE - Follow up [place] for Heparin >> Lovenox>> Coumadin Indication: atrial fibrillation  No Known Allergies  Patient Measurements: Height: 6\' 4"  (193 cm) Weight: 184 lb 8.4 oz (83.7 kg) IBW/kg (Calculated) : 86.8 HEPARIN DW (KG): 85  Vital Signs: Temp: 98.2 F (36.8 C) (10/28 0747) Temp Source: Oral (10/28 0747) BP: 137/88 (10/28 1018) Pulse Rate: 93 (10/28 1018)  Labs: Recent Labs    02/06/18 0432 02/07/18 0442 02/07/18 0443 02/08/18 0413  HGB 9.7*  --   --  9.6*  HCT 31.9*  --   --  31.4*  PLT 339  --   --  325  LABPROT 16.0* 17.3*  --  19.4*  INR 1.30 1.43  --  1.66  CREATININE 4.30*  --  4.25* 4.67*    Estimated Creatinine Clearance: 19.7 mL/min (A) (by C-G formula based on SCr of 4.67 mg/dL (H)).  Assessment: 62 y.o. male, with history of anxiety, osteoarthritis, atrial fibrillation on chronic anticoagulation with Coumadin, CAD, chronic back pain, COPD, ESRD who was previously on hemodialysis, nephrology had stopped dialysis as patient had been noncompliant and started making urine as per note from previous hospitalization from 01/29/2018, dialysis catheter was removed.  Patient recently was admitted with sepsis due to paraspinal abscess status post drainage, lumbar discitis.  He was discharged home on ciprofloxacin and Augmentin for 6 weeks of therapy.  At that time he also had a TEE which was negative for vegetation. Patient has been on Coumadin for afib.   Transitioned from Heparin to SQ Lovenox. Now will add coumadin back since extubated and bridge until INR > 2 INR is trending upwards.      Goal of Therapy:  INR 2-3 Monitor platelets by anticoagulation protocol: Yes   Plan:  Lovenox 1mg /kg SQ every 24 hours (adjusted to renal function) Coumadin 7.5mg  po x 1 today PT-INR daily Continue to monitor H&H and platelets  Thanks for allowing pharmacy to be a part of this patient's care.  Isac Sarna, BS Pharm D,  [place] Clinical Pharmacist Pager [phone] 02/08/2018 10:52 AM

## 2018-02-08 NOTE — Progress Notes (Signed)
Subjective: Interval History: Patient is feeling better.  He offers no complaints.  He claims he is ready to go home.  He does not have any nausea or vomiting.  Objective: Vital signs in last 24 hours: Temp:  [97.9 F (36.6 C)-98.4 F (36.9 C)] 98.2 F (36.8 C) (10/28 0747) Pulse Rate:  [87-123] 92 (10/28 0700) Resp:  [9-20] 14 (10/28 0700) BP: (119-158)/(77-113) 156/95 (10/28 0700) SpO2:  [93 %-100 %] 94 % (10/28 0809) Weight change:   Intake/Output from previous day: 10/27 0701 - 10/28 0700 In: 750 [P.O.:600; IV Piggyback:150] Out: 700 [Urine:700] Intake/Output this shift: No intake/output data recorded.  Patient is alert and in no apparent distress. Chest: Clear to auscultation, no wheezing. Heart exam revealed iregular rate and rhythm no murmur Abdomen: Soft positive bowel sounds Extremities no edema  Lab Results: Recent Labs    02/06/18 0432 02/08/18 0413  WBC 9.4 9.0  HGB 9.7* 9.6*  HCT 31.9* 31.4*  PLT 339 325   BMET:  Recent Labs    02/07/18 0443 02/08/18 0413  NA 138 139  K 3.6 3.5  CL 98 100  CO2 25 25  GLUCOSE 93 104*  BUN 51* 59*  CREATININE 4.25* 4.67*  CALCIUM 8.4* 8.3*   No results for input(s): PTH in the last 72 hours. Iron Studies:  No results for input(s): IRON, TIBC, TRANSFERRIN, FERRITIN in the last 72 hours.  Studies/Results: No results found.  I have reviewed the patient's current medications.  Assessment/Plan: 1] acute on chronic renal failure: His renal function is stable and his baseline.  Presently he does not have any uremic signs and symptoms. 2] altered mental status: Has recovered. 3] respiratory failure: Patient with history of COPD.  Denies any difficulty breathing. 4] bone and mineral disorder: Calcium and phosphorus is range.  Diet controlled. 5] anemia: His hemoglobin is low but stable. 6] hypertension: His blood pressure is better controlled today.. 7] history of paroxysmal atrial tachycardia: His heart rate is  controlled 8] metabolic acidosis: Has corrected.  Note on sodium bicarb. 9] diarrhea: Etiology at this moment not clear.  He is improving this morning.. Plan: 1] we we will continue with present management 2] patient advised not to miss his office appointment. 3] we will follow his blood work and make a decision about dialysis in case his renal function decline further. .   LOS: 8 days   Aaron [name] S 02/08/2018,9:30 AM

## 2018-02-08 NOTE — Progress Notes (Signed)
Patient alert and oriented x4. No current complaints of pain, chest pain, dizziness, nausea or vomiting. Patient up out of bed to chair, ambulatory independently with steady gait. Patient tolerated PO meds and diet well, appetite good. Patient now refusing to go to the [facility] and wanting to go home instead. Dr. Carles Collet was made aware and in to talk/see patient. IV removed without complications. Discharge instructions, follow up appointments with providers and medication education gone over with patient who expressed full understanding of information and especially medications as to what to take and what to not take anymore and how and when to take the medications. Patient discharged with belongings for home via car per family driving patient.

## 2018-02-17 ENCOUNTER — Inpatient Hospital Stay: Payer: 59 | Admitting: Family

## 2018-02-17 NOTE — Progress Notes (Deleted)
Subjective:    Patient ID: Aaron [name], male    DOB: 12-Sep-1955, 62 y.o.   MRN: [id number]  No chief complaint on file.   HPI:  Aaron [name] is a 62 y.o. male who presents today for hospitalization follow up for discitis and paraspinal abscess.  Aaron [name] was recently admitted to the hospital on 01/27/18 with increasing back pain, weakness, and fever following leaving the hospital AMA on 01/13/18 with the diagnosis of discitis and paraspinal abscess. He was initially supposed to receive vancomycin and ceftazidime with his dialysis treatment, however he had not been to dialysis. He has a previous history of Serratia marcescens bacteremia in August of 2019 and Enterococcus faecalis in a left arm wound. Abscess cultures were positive for Bacillus species with question of contamination. He was restarted on vancomycin and ceftazidime. It was determined that he was not in need of dialysis and was transitioned to an oral regimen of Augmentin and Ciprofloxacin at discharge. Baseline CRP and ESR were 4.1 and 119 respectively. Goal of therapy was set for 6 weeks with end date being 03/11/18. All hospital labs, records and imaging were reviewed in detail.   No Known Allergies    Outpatient Medications Prior to Visit  Medication Sig Dispense Refill  . albuterol (PROVENTIL HFA;VENTOLIN HFA) 108 (90 Base) MCG/ACT inhaler Inhale 2 puffs into the lungs every 6 (six) hours as needed for wheezing or shortness of breath.    Marland Kitchen amoxicillin-clavulanate (AUGMENTIN) 500-125 MG tablet Take 1 tablet (500 mg total) by mouth every 12 (twelve) hours. 84 tablet 0  . BREO ELLIPTA 200-25 MCG/INH AEPB Inhale 1 puff into the lungs every evening.     . cholecalciferol (VITAMIN D) 1000 units tablet Take 2,000 Units by mouth daily.    . ciprofloxacin (CIPRO) 500 MG tablet Take 1 tablet (500 mg total) by mouth daily. 42 tablet 0  . clopidogrel (PLAVIX) 75 MG tablet Take 75 mg by mouth daily.     Marland Kitchen diltiazem (CARDIZEM CD)  360 MG 24 hr capsule Take 1 capsule (360 mg total) by mouth daily. 30 capsule 1  . fluticasone (FLONASE) 50 MCG/ACT nasal spray Place 1 spray into both nostrils daily.    . furosemide (LASIX) 40 MG tablet Take 1 tablet (40 mg total) by mouth daily. 30 tablet 1  . hydrALAZINE (APRESOLINE) 50 MG tablet Take 1 tablet (50 mg total) by mouth every 8 (eight) hours. 90 tablet 1  . metoprolol tartrate (LOPRESSOR) 100 MG tablet Take 1 tablet (100 mg total) by mouth 2 (two) times daily. 60 tablet 1  . pantoprazole (PROTONIX) 40 MG tablet Take 1 tablet (40 mg total) by mouth 2 (two) times daily. 60 tablet 1  . traZODone (DESYREL) 50 MG tablet Take 50 mg by mouth at bedtime.    Marland Kitchen warfarin (COUMADIN) 5 MG tablet Take 1 tablet (5 mg total) by mouth daily at 6 PM. 7 tablet 0   No facility-administered medications prior to visit.      Past Medical History:  Diagnosis Date  . Anxiety   . Arthritis    knees , back , shoulders (10/12/2017)  . Atrial fibrillation ([place])   . CAD (coronary artery disease)    Mild nonobstructive disease at cardiac catheterization 2002  . Chronic back pain    "back of my neck; down thru my legs" (10/12/2017)  . COPD (chronic obstructive pulmonary disease) ([place])   . Diverticulitis   . Esophageal reflux   . ESRD (end  stage renal disease) on dialysis [facility])    "TTS; [place]" (11/23/2017)  . Essential hypertension   . Hepatitis C    states he no longer has this  . History of kidney stones   . History of syncope   . Hyperlipidemia   . Jerking 09/23/2014  . Myocardial infarction ([place]) 02/2017   "light one" (10/12/2017)  . Non-compliant behavior    non compliant with diaylsis per daughter  . PAT (paroxysmal atrial tachycardia) ([place])   . Peripheral arterial disease ([place])    Occluded left superficial femoral artery status post stent June 2016 - Dr. Trula Slade  . Pneumonia 1961  . Polycystic kidney, unspecified type   . Syncope 09/2014  . SYNCOPE 05/07/2010   Qualifier: Diagnosis of  By:  Laurance Flatten RN, BSN, [place]        Past Surgical History:  Procedure Laterality Date  . ABDOMINAL AORTOGRAM N/A 08/11/2017   Procedure: ABDOMINAL AORTOGRAM;  Surgeon: Serafina Mitchell, MD;  Location: [place] [facility];  Service: Cardiovascular;  Laterality: N/A;  . ANTERIOR CERVICAL DECOMP/DISCECTOMY FUSION    . APPLICATION OF WOUND VAC Right 08/14/2017   Procedure: APPLICATION OF PREVENA INCISIONAL WOUND VAC RIGHT GROIN;  Surgeon: Serafina Mitchell, MD;  [facility];  Service: Vascular;  Laterality: Right;  . AV FISTULA PLACEMENT Left 09/04/2017   Procedure: ARTERIOVENOUS (AV) FISTULA CREATION LEFT UPPER ARM;  Surgeon: Serafina Mitchell, MD;  Location: [place];  Service: Vascular;  Laterality: Left;  . BACK SURGERY    . BASCILIC VEIN TRANSPOSITION Left 11/20/2017   Procedure: SECOND STAGE BASILIC VEIN TRANSPOSITION LEFT ARM;  Surgeon: Serafina Mitchell, MD;  Location: [place];  Service: Vascular;  Laterality: Left;  . BIOPSY  12/17/2016   Procedure: BIOPSY;  Surgeon: Daneil Dolin, MD;  [facility];  Service: Gastroenterology;;  gastric colon  . BIOPSY  04/29/2017   Procedure: BIOPSY;  Surgeon: Daneil Dolin, MD;  [facility];  Service: Endoscopy;;  duodenal biopsies  . BUNIONECTOMY Bilateral   . COLONOSCOPY  2008   Dr. Oneida Alar: rare sigmoid colon diverticulosis, internal hemorrhoids.   . COLONOSCOPY WITH PROPOFOL N/A 12/17/2016   dense left-sided diverticulosis, right colon ulcers s/p biopsy query occult NSAID use vs transient ischemia, not consistent with IBD. CMV stains negative.   Marland Kitchen ENDARTERECTOMY FEMORAL Right 08/14/2017   Procedure: RIGHT ILLIO-FEMORAL ENDARTERECTOMY;  Surgeon: Serafina Mitchell, MD;  [facility];  Service: Vascular;  Laterality: Right;  . ESOPHAGEAL DILATION  12/17/2016   EGD with mild Schatzki's ring s/p dilatation, small hiatal hernia, erosive gastropathy (negative H.pylori gastritis)  . ESOPHAGOGASTRODUODENOSCOPY  2008   Dr. Oneida Alar: normal  esophagus without Barrett's, antritis and duodenitis, path with H.pylori gastritis  . ESOPHAGOGASTRODUODENOSCOPY (EGD) WITH PROPOFOL N/A 12/17/2016   Procedure: ESOPHAGOGASTRODUODENOSCOPY (EGD) WITH PROPOFOL;  Surgeon: Daneil Dolin, MD;  [facility];  Service: Gastroenterology;  Laterality: N/A;  . ESOPHAGOGASTRODUODENOSCOPY (EGD) WITH PROPOFOL N/A 04/29/2017   Patchy erythema of gastric mucosa diffusely, extensive inflammatory changes in duodenum, geographic ulceration and mucosal edema present, encroaching somewhat on the lumen yet still widely patent, distal second portion of duodenum appeared abnormal, path with peptic duodenitis with ulceration  . GROIN DEBRIDEMENT Right 10/14/2017   Procedure: RIGHT GROIN AND RIGHT LOWER QUADRANT ABDOMEN DEBRIDEMENT WITH PLACEMENT OF ANTIBIOTIC BEADS;  Surgeon: Serafina Mitchell, MD;  Location: [place];  Service: Vascular;  Laterality: Right;  . HEMATOMA EVACUATION Left 12/06/2017   Procedure: EVACUATION  HEMATOMA;  Surgeon: Waynetta Sandy, MD;  Location: [place];  Service: Vascular;  Laterality: Left;  . HERNIA REPAIR  2409   umbilical  . I&D EXTREMITY Right 09/21/2017   Procedure: IRRIGATION AND DEBRIDEMENT GROIN;  Surgeon: Elam Dutch, MD;  Location[facility];  Service: Vascular;  Laterality: Right;  . I&D EXTREMITY Left 11/23/2017   Procedure: Evacuation Hematoma LEFT UPPER ARM GRAFT;  Surgeon: Elam Dutch, MD;  Location: [place];  Service: Vascular;  Laterality: Left;  . INGUINAL HERNIA REPAIR Right 02/2017   [place]  . INSERTION OF DIALYSIS CATHETER Right 09/04/2017   Procedure: INSERTION OF TUNNELED  DIALYSIS CATHETER - RIGHT INTERNAL JUGULAR PLACEMENT;  Surgeon: Serafina Mitchell, MD;  Location: [place];  Service: Vascular;  Laterality: Right;  . INSERTION OF ILIAC STENT Right 08/14/2017   Procedure: INSERTION OF RIGHT COMMON ILIAC STENT 33m x 820mx 130cm INSERTION OF RIGHT EXTERNAL ILIAC STENT 104m67m 68m56m130cm INSERTION OF  SUPERFICIAL FERMORAL ARTERY STENT 7mm 83m0mm 7m0cm;  Surgeon: BrabhaSerafina Mitchell [facility];  Laterality: Right;  . IR FLUORO GUIDE CV LINE RIGHT  08/31/2017  . IR US [place] VASC ACCESS RIGHT  08/31/2017  . LOWER EXTREMITY ANGIOGRAPHY Right 08/11/2017   Procedure: LOWER EXTREMITY ANGIOGRAPHY;  Surgeon: BrabhaSerafina Mitchell [facility];  Service: Cardiovascular;  Laterality: Right;  . PATCH ANGIOPLASTY Right 08/14/2017   Procedure: PATCH ANGIOPLASTY USING HEMASHIELD PATCH 0.3IN X 6IN;Lillie Columbiageon: BrabhaSerafina Mitchell [facility];  Service: Vascular;  Laterality: Right;  . PERIPHERAL VASCULAR CATHETERIZATION N/A 09/20/2014   Procedure: Abdominal Aortogram;  Surgeon: Vance Serafina Mitchell [facility];  Service: Cardiovascular;  Laterality: N/A;  . POSTERIOR FUSION LUMBAR SPINE    . TEE WITHOUT CARDIOVERSION N/A 01/12/2018   Procedure: TRANSESOPHAGEAL ECHOCARDIOGRAM (TEE);  Surgeon: CrenshLelon Perla [facility];  Service: Cardiovascular;  Laterality: N/A;      Family History  Problem Relation Age of Onset  . Alcoholism Mother   . Heart disease Father        Massive heart attack  . Heart attack Father   . Atrial fibrillation Father   . Colon cancer Father   . Colon cancer Maternal Grandfather 60  . 89coholism Maternal Grandfather   . Renal cancer Cousin   . Ovarian cancer Sister       Social History   Socioeconomic History  . Marital status: Legally Separated    Spouse name: Not on file  . Number of children: 2  . Years of education: Not on file  . Highest education level: Not on file  Occupational History  . Occupation: DISABLED  Social Needs  . Financial resource strain: Not on file  . Food insecurity:    Worry: Not on file    Inability: Not on file  . Transportation needs:    Medical: No    Non-medical: No  Tobacco Use  . Smoking status: Current Every Day Smoker    Packs/day: 2.00     Years: 47.00    Pack years: 94.00    Types: Cigarettes    Last attempt to quit: 08/03/2017    Years since quitting: 0.5  . Smokeless tobacco: Never Used  Substance and Sexual Activity  . Alcohol use: Not Currently    Comment: 10/12/2017 alcohol free since 2017,  heavy drinker in the past  .  Drug use: Not Currently    Comment: "<2003 whatever was around; nothing since"  . Sexual activity: Not Currently  Lifestyle  . Physical activity:    Days per week: Not on file    Minutes per session: Not on file  . Stress: Not on file  Relationships  . Social connections:    Talks on phone: Not on file    Gets together: Not on file    Attends religious service: Not on file    Active member of club or organization: Not on file    Attends meetings of clubs or organizations: Not on file    Relationship status: Not on file  . Intimate partner violence:    Fear of current or ex partner: Not on file    Emotionally abused: Not on file    Physically abused: Not on file    Forced sexual activity: Not on file  Other Topics Concern  . Not on file  Social History Narrative   Disabled from Back since 2007      Review of Systems     Objective:    There were no vitals taken for this visit. Nursing note and vital signs reviewed.  Physical Exam      Assessment & Plan:   Problem List Items Addressed This Visit    None       I am having Daegon L. Orozco maintain his pantoprazole, cholecalciferol, BREO ELLIPTA, clopidogrel, warfarin, ciprofloxacin, amoxicillin-clavulanate, fluticasone, albuterol, traZODone, diltiazem, furosemide, metoprolol tartrate, and hydrALAZINE.   No orders of the defined types were placed in this encounter.    Follow-up: No follow-ups on file.    Terri Piedra, MSN, FNP-C Nurse Practitioner [facility] Office phone: [phone] Pager: [place] number: [phone]

## 2018-03-31 ENCOUNTER — Inpatient Hospital Stay (HOSPITAL_COMMUNITY)
Admission: EM | Admit: 2018-03-31 | Discharge: 2018-04-04 | DRG: 689 | Disposition: A | Discharge status: Skilled Nursing Facility | Payer: 59 | Attending: Internal Medicine | Admitting: Internal Medicine

## 2018-03-31 ENCOUNTER — Encounter (HOSPITAL_COMMUNITY): Payer: Self-pay | Admitting: Emergency Medicine

## 2018-03-31 ENCOUNTER — Other Ambulatory Visit: Payer: Self-pay

## 2018-03-31 ENCOUNTER — Emergency Department (HOSPITAL_COMMUNITY): Payer: 59

## 2018-03-31 DIAGNOSIS — K648 Other hemorrhoids: Secondary | ICD-10-CM | POA: Diagnosis present

## 2018-03-31 DIAGNOSIS — N186 End stage renal disease: Secondary | ICD-10-CM | POA: Diagnosis present

## 2018-03-31 DIAGNOSIS — M549 Dorsalgia, unspecified: Secondary | ICD-10-CM | POA: Diagnosis not present

## 2018-03-31 DIAGNOSIS — M4646 Discitis, unspecified, lumbar region: Secondary | ICD-10-CM | POA: Diagnosis present

## 2018-03-31 DIAGNOSIS — I2510 Atherosclerotic heart disease of native coronary artery without angina pectoris: Secondary | ICD-10-CM | POA: Diagnosis present

## 2018-03-31 DIAGNOSIS — Z681 Body mass index (BMI) 19 or less, adult: Secondary | ICD-10-CM

## 2018-03-31 DIAGNOSIS — J418 Mixed simple and mucopurulent chronic bronchitis: Secondary | ICD-10-CM | POA: Diagnosis not present

## 2018-03-31 DIAGNOSIS — I10 Essential (primary) hypertension: Secondary | ICD-10-CM | POA: Diagnosis present

## 2018-03-31 DIAGNOSIS — Q612 Polycystic kidney, adult type: Secondary | ICD-10-CM | POA: Diagnosis not present

## 2018-03-31 DIAGNOSIS — K7689 Other specified diseases of liver: Secondary | ICD-10-CM | POA: Diagnosis present

## 2018-03-31 DIAGNOSIS — N390 Urinary tract infection, site not specified: Principal | ICD-10-CM | POA: Diagnosis present

## 2018-03-31 DIAGNOSIS — E861 Hypovolemia: Secondary | ICD-10-CM | POA: Diagnosis present

## 2018-03-31 DIAGNOSIS — M4620 Osteomyelitis of vertebra, site unspecified: Secondary | ICD-10-CM

## 2018-03-31 DIAGNOSIS — N179 Acute kidney failure, unspecified: Secondary | ICD-10-CM | POA: Diagnosis present

## 2018-03-31 DIAGNOSIS — Z9114 Patient's other noncompliance with medication regimen: Secondary | ICD-10-CM

## 2018-03-31 DIAGNOSIS — Z8619 Personal history of other infectious and parasitic diseases: Secondary | ICD-10-CM | POA: Diagnosis present

## 2018-03-31 DIAGNOSIS — R310 Gross hematuria: Secondary | ICD-10-CM | POA: Diagnosis present

## 2018-03-31 DIAGNOSIS — Z5320 Procedure and treatment not carried out because of patient's decision for unspecified reasons: Secondary | ICD-10-CM | POA: Diagnosis not present

## 2018-03-31 DIAGNOSIS — I700 Atherosclerosis of aorta: Secondary | ICD-10-CM | POA: Diagnosis present

## 2018-03-31 DIAGNOSIS — F17210 Nicotine dependence, cigarettes, uncomplicated: Secondary | ICD-10-CM | POA: Diagnosis present

## 2018-03-31 DIAGNOSIS — Z811 Family history of alcohol abuse and dependence: Secondary | ICD-10-CM

## 2018-03-31 DIAGNOSIS — D631 Anemia in chronic kidney disease: Secondary | ICD-10-CM | POA: Diagnosis present

## 2018-03-31 DIAGNOSIS — Z720 Tobacco use: Secondary | ICD-10-CM | POA: Diagnosis present

## 2018-03-31 DIAGNOSIS — Z9115 Patient's noncompliance with renal dialysis: Secondary | ICD-10-CM

## 2018-03-31 DIAGNOSIS — Z8051 Family history of malignant neoplasm of kidney: Secondary | ICD-10-CM

## 2018-03-31 DIAGNOSIS — R319 Hematuria, unspecified: Secondary | ICD-10-CM | POA: Diagnosis present

## 2018-03-31 DIAGNOSIS — Z23 Encounter for immunization: Secondary | ICD-10-CM

## 2018-03-31 DIAGNOSIS — Z800 Family history of malignant neoplasm of digestive organs: Secondary | ICD-10-CM

## 2018-03-31 DIAGNOSIS — N185 Chronic kidney disease, stage 5: Secondary | ICD-10-CM

## 2018-03-31 DIAGNOSIS — Q613 Polycystic kidney, unspecified: Secondary | ICD-10-CM | POA: Diagnosis not present

## 2018-03-31 DIAGNOSIS — J449 Chronic obstructive pulmonary disease, unspecified: Secondary | ICD-10-CM | POA: Diagnosis present

## 2018-03-31 DIAGNOSIS — I482 Chronic atrial fibrillation, unspecified: Secondary | ICD-10-CM | POA: Diagnosis present

## 2018-03-31 DIAGNOSIS — E785 Hyperlipidemia, unspecified: Secondary | ICD-10-CM | POA: Diagnosis present

## 2018-03-31 DIAGNOSIS — Z7901 Long term (current) use of anticoagulants: Secondary | ICD-10-CM

## 2018-03-31 DIAGNOSIS — M4626 Osteomyelitis of vertebra, lumbar region: Secondary | ICD-10-CM | POA: Diagnosis present

## 2018-03-31 DIAGNOSIS — M4800 Spinal stenosis, site unspecified: Secondary | ICD-10-CM | POA: Diagnosis present

## 2018-03-31 DIAGNOSIS — G8929 Other chronic pain: Secondary | ICD-10-CM | POA: Diagnosis present

## 2018-03-31 DIAGNOSIS — M5440 Lumbago with sciatica, unspecified side: Secondary | ICD-10-CM | POA: Diagnosis not present

## 2018-03-31 DIAGNOSIS — K5730 Diverticulosis of large intestine without perforation or abscess without bleeding: Secondary | ICD-10-CM | POA: Diagnosis present

## 2018-03-31 DIAGNOSIS — Z992 Dependence on renal dialysis: Secondary | ICD-10-CM

## 2018-03-31 DIAGNOSIS — E43 Unspecified severe protein-calorie malnutrition: Secondary | ICD-10-CM | POA: Diagnosis not present

## 2018-03-31 DIAGNOSIS — I252 Old myocardial infarction: Secondary | ICD-10-CM

## 2018-03-31 DIAGNOSIS — Z9119 Patient's noncompliance with other medical treatment and regimen: Secondary | ICD-10-CM

## 2018-03-31 DIAGNOSIS — N3001 Acute cystitis with hematuria: Secondary | ICD-10-CM | POA: Diagnosis not present

## 2018-03-31 DIAGNOSIS — Z8249 Family history of ischemic heart disease and other diseases of the circulatory system: Secondary | ICD-10-CM

## 2018-03-31 DIAGNOSIS — N200 Calculus of kidney: Secondary | ICD-10-CM | POA: Diagnosis present

## 2018-03-31 DIAGNOSIS — Z79899 Other long term (current) drug therapy: Secondary | ICD-10-CM

## 2018-03-31 DIAGNOSIS — I120 Hypertensive chronic kidney disease with stage 5 chronic kidney disease or end stage renal disease: Secondary | ICD-10-CM | POA: Diagnosis present

## 2018-03-31 DIAGNOSIS — Z91148 Patient's other noncompliance with medication regimen for other reason: Secondary | ICD-10-CM

## 2018-03-31 DIAGNOSIS — Z95828 Presence of other vascular implants and grafts: Secondary | ICD-10-CM

## 2018-03-31 DIAGNOSIS — Z87442 Personal history of urinary calculi: Secondary | ICD-10-CM

## 2018-03-31 DIAGNOSIS — Z8041 Family history of malignant neoplasm of ovary: Secondary | ICD-10-CM

## 2018-03-31 LAB — COMPREHENSIVE METABOLIC PANEL
ALBUMIN: 3.5 g/dL (ref 3.5–5.0)
ALK PHOS: 106 U/L (ref 38–126)
ALT: 10 U/L (ref 0–44)
ANION GAP: 11 (ref 5–15)
AST: 10 U/L — ABNORMAL LOW (ref 15–41)
BILIRUBIN TOTAL: 0.7 mg/dL (ref 0.3–1.2)
BUN: 53 mg/dL — ABNORMAL HIGH (ref 8–23)
CALCIUM: 8.5 mg/dL — AB (ref 8.9–10.3)
CO2: 19 mmol/L — ABNORMAL LOW (ref 22–32)
Chloride: 106 mmol/L (ref 98–111)
Creatinine, Ser: 5.48 mg/dL — ABNORMAL HIGH (ref 0.61–1.24)
GFR, EST AFRICAN AMERICAN: 12 mL/min — AB (ref >60)
GFR, EST NON AFRICAN AMERICAN: 10 mL/min — AB (ref >60)
GLUCOSE: 96 mg/dL (ref 70–99)
POTASSIUM: 4 mmol/L (ref 3.5–5.1)
Sodium: 136 mmol/L (ref 135–145)
TOTAL PROTEIN: 7 g/dL (ref 6.5–8.1)

## 2018-03-31 LAB — CBC WITH DIFFERENTIAL/PLATELET
Abs Immature Granulocytes: 0.04 10*3/uL (ref 0.00–0.07)
BASOS ABS: 0.1 10*3/uL (ref 0.0–0.1)
Basophils Relative: 1 %
EOS ABS: 0 10*3/uL (ref 0.0–0.5)
EOS PCT: 0 %
HEMATOCRIT: 38.1 % — AB (ref 39.0–52.0)
HEMOGLOBIN: 12.1 g/dL — AB (ref 13.0–17.0)
Immature Granulocytes: 0 %
LYMPHS ABS: 1.1 10*3/uL (ref 0.7–4.0)
LYMPHS PCT: 12 %
MCH: 31 pg (ref 26.0–34.0)
MCHC: 31.8 g/dL (ref 30.0–36.0)
MCV: 97.7 fL (ref 80.0–100.0)
MONO ABS: 0.7 10*3/uL (ref 0.1–1.0)
Monocytes Relative: 7 %
NRBC: 0 % (ref 0.0–0.2)
Neutro Abs: 7.4 10*3/uL (ref 1.7–7.7)
Neutrophils Relative %: 80 %
Platelets: 259 10*3/uL (ref 150–400)
RBC: 3.9 MIL/uL — ABNORMAL LOW (ref 4.22–5.81)
RDW: 14.5 % (ref 11.5–15.5)
WBC: 9.3 10*3/uL (ref 4.0–10.5)

## 2018-03-31 LAB — URINALYSIS, ROUTINE W REFLEX MICROSCOPIC
BILIRUBIN URINE: NEGATIVE
Glucose, UA: NEGATIVE mg/dL
Ketones, ur: NEGATIVE mg/dL
NITRITE: NEGATIVE
PH: 6 (ref 5.0–8.0)
Protein, ur: 100 mg/dL — AB
RBC / HPF: >50 RBC/hpf — ABNORMAL HIGH (ref 0–5)
SPECIFIC GRAVITY, URINE: 1.011 (ref 1.005–1.030)

## 2018-03-31 MED ORDER — SODIUM CHLORIDE 0.9 % IV SOLN: 250.0000 mL | INTRAVENOUS | Status: DC | PRN

## 2018-03-31 MED ORDER — METOPROLOL TARTRATE 50 MG PO TABS
100.0000 mg | ORAL_TABLET | Freq: Two times a day (BID) | ORAL | Status: DC
  Administered 2018-03-31 – 2018-04-04 (×8): 100 mg via ORAL
  Filled 2018-03-31 (×8): qty 2

## 2018-03-31 MED ORDER — ALBUTEROL SULFATE (2.5 MG/3ML) 0.083% IN NEBU: 3.0000 mL | INHALATION_SOLUTION | Freq: Four times a day (QID) | RESPIRATORY_TRACT | Status: DC | PRN

## 2018-03-31 MED ORDER — OXYCODONE HCL 5 MG PO TABS
5.0000 mg | ORAL_TABLET | ORAL | Status: DC | PRN
  Administered 2018-04-01 – 2018-04-04 (×14): 5 mg via ORAL
  Filled 2018-03-31 (×14): qty 1

## 2018-03-31 MED ORDER — SODIUM CHLORIDE 0.9 % IV SOLN
1.0000 g | INTRAVENOUS | Status: DC
  Filled 2018-03-31: qty 10

## 2018-03-31 MED ORDER — OXYCODONE-ACETAMINOPHEN 5-325 MG PO TABS
2.0000 | ORAL_TABLET | Freq: Once | ORAL | Status: AC
  Administered 2018-03-31: 2 via ORAL
  Filled 2018-03-31: qty 2

## 2018-03-31 MED ORDER — SODIUM CHLORIDE 0.9% FLUSH
3.0000 mL | Freq: Two times a day (BID) | INTRAVENOUS | Status: DC
  Administered 2018-04-01 – 2018-04-04 (×6): 3 mL via INTRAVENOUS

## 2018-03-31 MED ORDER — PANTOPRAZOLE SODIUM 40 MG PO TBEC
40.0000 mg | DELAYED_RELEASE_TABLET | Freq: Two times a day (BID) | ORAL | Status: DC
  Administered 2018-03-31 – 2018-04-04 (×8): 40 mg via ORAL
  Filled 2018-03-31 (×8): qty 1

## 2018-03-31 MED ORDER — DILTIAZEM HCL ER COATED BEADS 180 MG PO CP24
360.0000 mg | ORAL_CAPSULE | Freq: Every day | ORAL | Status: DC
  Administered 2018-04-01 – 2018-04-04 (×4): 360 mg via ORAL
  Filled 2018-03-31 (×3): qty 2
  Filled 2018-03-31 (×2): qty 1
  Filled 2018-03-31: qty 2

## 2018-03-31 MED ORDER — FUROSEMIDE 40 MG PO TABS
40.0000 mg | ORAL_TABLET | Freq: Every day | ORAL | Status: DC
  Administered 2018-04-01 – 2018-04-04 (×4): 40 mg via ORAL
  Filled 2018-03-31 (×4): qty 1

## 2018-03-31 MED ORDER — HYDRALAZINE HCL 25 MG PO TABS
50.0000 mg | ORAL_TABLET | Freq: Three times a day (TID) | ORAL | Status: DC
  Administered 2018-03-31 – 2018-04-04 (×11): 50 mg via ORAL
  Filled 2018-03-31 (×12): qty 2

## 2018-03-31 MED ORDER — SODIUM CHLORIDE 0.9% FLUSH: 3.0000 mL | INTRAVENOUS | Status: DC | PRN

## 2018-03-31 MED ORDER — INFLUENZA VAC SPLIT QUAD 0.5 ML IM SUSY
0.5000 mL | PREFILLED_SYRINGE | INTRAMUSCULAR | Status: AC
  Administered 2018-04-01: 0.5 mL via INTRAMUSCULAR
  Filled 2018-03-31: qty 0.5

## 2018-03-31 MED ORDER — ENSURE ENLIVE PO LIQD: 237.0000 mL | Freq: Two times a day (BID) | ORAL | Status: DC

## 2018-03-31 MED ORDER — SODIUM CHLORIDE 0.9 % IV SOLN
1.0000 g | Freq: Once | INTRAVENOUS | Status: AC
  Administered 2018-03-31: 1 g via INTRAVENOUS
  Filled 2018-03-31: qty 10

## 2018-03-31 MED ORDER — SODIUM CHLORIDE 0.9 % IV SOLN
INTRAVENOUS | Status: DC | PRN
  Administered 2018-03-31: 22:00:00 via INTRAVENOUS

## 2018-03-31 NOTE — H&P (Signed)
History and Physical    Aaron [name] UUV:[id number] DOB: [birth year] DOA: 03/31/2018  PCP: Gwenlyn Saran [place]  Patient coming from: Home  Chief Complaint: Worsening back pain  HPI: Aaron [name] is a 62 y.o. male with medical history significant of coronary artery disease, end-stage renal disease previously on dialysis not currently, COPD, chronic back pain, recent discitis completed treatment with oral antibiotics last week, longstanding noncompliance comes in with worsening lower back pain.  Patient is been fighting back pain for months especially with his recent discitis.  He denies any fevers at home.  He denies any urinary symptoms.  He denies any urinary or bowel incontinence.  He denies any saddle anesthesia.  Patient has had 3-4 falls in the last week he has been having weakness since his recent bout of discitis.  He was offered inpatient rehab but declined this for some reason.  Patient a CAT scan of his back today which shows questionable discitis unclear if this is an active issue.  Patient is being referred for admission for MRI in the morning to fully evaluate.  Review of Systems: As per HPI otherwise 10 point review of systems negative.   Past Medical History:  Diagnosis Date  . Anxiety   . Arthritis    knees , back , shoulders (10/12/2017)  . Atrial fibrillation ([place])   . CAD (coronary artery disease)    Mild nonobstructive disease at cardiac catheterization 2002  . Chronic back pain    "back of my neck; down thru my legs" (10/12/2017)  . COPD (chronic obstructive pulmonary disease) ([place])   . Diverticulitis   . Esophageal reflux   . ESRD (end stage renal disease) on dialysis [facility])    "TTS; [place]" (11/23/2017)  . Essential hypertension   . Hepatitis C    states he no longer has this  . History of kidney stones   . History of syncope   . Hyperlipidemia   . Jerking 09/23/2014  . Myocardial infarction ([place]) 02/2017   "light one" (10/12/2017)  .  Non-compliant behavior    non compliant with diaylsis per daughter  . PAT (paroxysmal atrial tachycardia) ([place])   . Peripheral arterial disease ([place])    Occluded left superficial femoral artery status post stent June 2016 - Dr. Trula Slade  . Pneumonia 1961  . Polycystic kidney, unspecified type   . Syncope 09/2014  . SYNCOPE 05/07/2010   Qualifier: Diagnosis of  By: Laurance Flatten RN, BSN, Anderson Malta      Past Surgical History:  Procedure Laterality Date  . ABDOMINAL AORTOGRAM N/A 08/11/2017   Procedure: ABDOMINAL AORTOGRAM;  Surgeon: Serafina Mitchell, MD;  Location: [place] CV LAB;  Service: Cardiovascular;  Laterality: N/A;  . ANTERIOR CERVICAL DECOMP/DISCECTOMY FUSION    . APPLICATION OF WOUND VAC Right 08/14/2017   Procedure: APPLICATION OF PREVENA INCISIONAL WOUND VAC RIGHT GROIN;  Surgeon: Serafina Mitchell, MD;  [facility];  Service: Vascular;  Laterality: Right;  . AV FISTULA PLACEMENT Left 09/04/2017   Procedure: ARTERIOVENOUS (AV) FISTULA CREATION LEFT UPPER ARM;  Surgeon: Serafina Mitchell, MD;  Location: [place];  Service: Vascular;  Laterality: Left;  . BACK SURGERY    . BASCILIC VEIN TRANSPOSITION Left 11/20/2017   Procedure: SECOND STAGE BASILIC VEIN TRANSPOSITION LEFT ARM;  Surgeon: Serafina Mitchell, MD;  Location: [place];  Service: Vascular;  Laterality: Left;  . BIOPSY  12/17/2016   Procedure: BIOPSY;  Surgeon: Daneil Dolin, MD;  [facility];  Service: Gastroenterology;;  gastric colon  . BIOPSY  04/29/2017   Procedure: BIOPSY;  Surgeon: Daneil Dolin, MD;  [facility];  Service: Endoscopy;;  duodenal biopsies  . BUNIONECTOMY Bilateral   . COLONOSCOPY  2008   Dr. Oneida Alar: rare sigmoid colon diverticulosis, internal hemorrhoids.   . COLONOSCOPY WITH PROPOFOL N/A 12/17/2016   dense left-sided diverticulosis, right colon ulcers s/p biopsy query occult NSAID use vs transient ischemia, not consistent with IBD. CMV stains negative.   Marland Kitchen ENDARTERECTOMY FEMORAL Right  08/14/2017   Procedure: RIGHT ILLIO-FEMORAL ENDARTERECTOMY;  Surgeon: Serafina Mitchell, MD;  Location: [facility] OR;  Service: Vascular;  Laterality: Right;  . ESOPHAGEAL DILATION  12/17/2016   EGD with mild Schatzki's ring s/p dilatation, small hiatal hernia, erosive gastropathy (negative H.pylori gastritis)  . ESOPHAGOGASTRODUODENOSCOPY  2008   Dr. Oneida Alar: normal esophagus without Barrett's, antritis and duodenitis, path with H.pylori gastritis  . ESOPHAGOGASTRODUODENOSCOPY (EGD) WITH PROPOFOL N/A 12/17/2016   Procedure: ESOPHAGOGASTRODUODENOSCOPY (EGD) WITH PROPOFOL;  Surgeon: Daneil Dolin, MD;  [facility];  Service: Gastroenterology;  Laterality: N/A;  . ESOPHAGOGASTRODUODENOSCOPY (EGD) WITH PROPOFOL N/A 04/29/2017   Patchy erythema of gastric mucosa diffusely, extensive inflammatory changes in duodenum, geographic ulceration and mucosal edema present, encroaching somewhat on the lumen yet still widely patent, distal second portion of duodenum appeared abnormal, path with peptic duodenitis with ulceration  . GROIN DEBRIDEMENT Right 10/14/2017   Procedure: RIGHT GROIN AND RIGHT LOWER QUADRANT ABDOMEN DEBRIDEMENT WITH PLACEMENT OF ANTIBIOTIC BEADS;  Surgeon: Serafina Mitchell, MD;  Location: [place];  Service: Vascular;  Laterality: Right;  . HEMATOMA EVACUATION Left 12/06/2017   Procedure: EVACUATION HEMATOMA;  Surgeon: Waynetta Sandy, MD;  Location: [place];  Service: Vascular;  Laterality: Left;  . HERNIA REPAIR  4098   umbilical  . I&D EXTREMITY Right 09/21/2017   Procedure: IRRIGATION AND DEBRIDEMENT GROIN;  Surgeon: Elam Dutch, MD;  [facility] OR;  Service: Vascular;  Laterality: Right;  . I&D EXTREMITY Left 11/23/2017   Procedure: Evacuation Hematoma LEFT UPPER ARM GRAFT;  Surgeon: Elam Dutch, MD;  Location: [place];  Service: Vascular;  Laterality: Left;  . INGUINAL HERNIA REPAIR Right 02/2017   [place]  . INSERTION OF DIALYSIS CATHETER Right 09/04/2017   Procedure:  INSERTION OF TUNNELED  DIALYSIS CATHETER - RIGHT INTERNAL JUGULAR PLACEMENT;  Surgeon: Serafina Mitchell, MD;  Location: [place];  Service: Vascular;  Laterality: Right;  . INSERTION OF ILIAC STENT Right 08/14/2017   Procedure: INSERTION OF RIGHT COMMON ILIAC STENT 34mm x 17mm x 130cm INSERTION OF RIGHT EXTERNAL ILIAC STENT 41mm x 14mm x 130cm INSERTION OF SUPERFICIAL FERMORAL ARTERY STENT 14mm x 31mm x 130cm;  Surgeon: Serafina Mitchell, MD;  Location: [facility] OR;  Service: Vascular;  Laterality: Right;  . IR FLUORO GUIDE CV LINE RIGHT  08/31/2017  . IR US GUIDE VASC ACCESS RIGHT  08/31/2017  . LOWER EXTREMITY ANGIOGRAPHY Right 08/11/2017   Procedure: LOWER EXTREMITY ANGIOGRAPHY;  Surgeon: Serafina Mitchell, MD;  Location: [place] CV LAB;  Service: Cardiovascular;  Laterality: Right;  . PATCH ANGIOPLASTY Right 08/14/2017   Procedure: PATCH ANGIOPLASTY USING HEMASHIELD PATCH 0.3IN Lillie [place];  Surgeon: Serafina Mitchell, MD;  [facility];  Service: Vascular;  Laterality: Right;  . PERIPHERAL VASCULAR CATHETERIZATION N/A 09/20/2014   Procedure: Abdominal Aortogram;  Surgeon: Serafina Mitchell, MD;  Location: [place] CV LAB;  Service: Cardiovascular;  Laterality: N/A;  . POSTERIOR FUSION LUMBAR SPINE    .  TEE WITHOUT CARDIOVERSION N/A 01/12/2018   Procedure: TRANSESOPHAGEAL ECHOCARDIOGRAM (TEE);  Surgeon: Lelon Perla, MD;  Location: [facility] ENDOSCOPY;  Service: Cardiovascular;  Laterality: N/A;     reports that he has been smoking cigarettes. He has a 94.00 pack-year smoking history. He has never used smokeless tobacco. He reports previous alcohol use. He reports previous drug use.  No Known Allergies  Family History  Problem Relation Age of Onset  . Alcoholism Mother   . Heart disease Father        Massive heart attack  . Heart attack Father   . Atrial fibrillation Father   . Colon cancer Father   . Colon cancer Maternal Grandfather 32  . Alcoholism Maternal Grandfather   . Renal cancer Cousin   .  Ovarian cancer Sister     Prior to Admission medications   Medication Sig Start Date End Date Taking? Authorizing Provider  albuterol (PROVENTIL HFA;VENTOLIN HFA) 108 (90 Base) MCG/ACT inhaler Inhale 2 puffs into the lungs every 6 (six) hours as needed for wheezing or shortness of breath.   Yes [provider]  BREO ELLIPTA 200-25 MCG/INH AEPB Inhale 1 puff into the lungs every evening.  07/27/17  Yes [provider]  cholecalciferol (VITAMIN D) 1000 units tablet Take 2,000 Units by mouth daily.   Yes [provider]  clopidogrel (PLAVIX) 75 MG tablet Take 75 mg by mouth daily.  12/24/17  Yes [provider]  diltiazem (CARDIZEM CD) 360 MG 24 hr capsule Take 1 capsule (360 mg total) by mouth daily. 02/09/18  Yes Tat, Shanon Brow, MD  fluticasone (FLONASE) 50 MCG/ACT nasal spray Place 1 spray into both nostrils daily.   Yes [provider]  furosemide (LASIX) 40 MG tablet Take 1 tablet (40 mg total) by mouth daily. 02/09/18  Yes Tat, Shanon Brow, MD  hydrALAZINE (APRESOLINE) 50 MG tablet Take 1 tablet (50 mg total) by mouth every 8 (eight) hours. 02/08/18  Yes Tat, Shanon Brow, MD  metoprolol tartrate (LOPRESSOR) 100 MG tablet Take 1 tablet (100 mg total) by mouth 2 (two) times daily. 02/08/18  Yes Tat, Shanon Brow, MD  oxyCODONE-acetaminophen (PERCOCET) 10-325 MG tablet Take 1 tablet by mouth 4 (four) times daily. 02/12/18  Yes [provider]  pantoprazole (PROTONIX) 40 MG tablet Take 1 tablet (40 mg total) by mouth 2 (two) times daily. 04/30/17 01/27/26 Yes Shah, Pratik D, DO  traZODone (DESYREL) 50 MG tablet Take 50 mg by mouth at bedtime.   Yes [provider]  warfarin (COUMADIN) 5 MG tablet Take 1 tablet (5 mg total) by mouth daily at 6 PM. Patient not taking: Reported on 03/31/2018 01/29/18   Bonnielee Haff, MD    Physical Exam: Vitals:   03/31/18 1456 03/31/18 1800 03/31/18 2130  BP: 114/64 (!) 168/99 (!) 149/106  Pulse: 66 80 87  Resp: 17 16 16     Temp: 97.9 F (36.6 C)    TempSrc: Oral    SpO2: 99% 100% 99%  Weight: 71.3 kg    Height: 6\' 4"  (1.93 m)        Constitutional: NAD, calm, comfortable Vitals:   03/31/18 1456 03/31/18 1800 03/31/18 2130  BP: 114/64 (!) 168/99 (!) 149/106  Pulse: 66 80 87  Resp: 17 16 16   Temp: 97.9 F (36.6 C)    TempSrc: Oral    SpO2: 99% 100% 99%  Weight: 71.3 kg    Height: 6\' 4"  (1.93 m)     Eyes: PERRL, lids and conjunctivae normal ENMT: Mucous  membranes are moist. Posterior pharynx clear of any exudate or lesions.Normal dentition.  Neck: normal, supple, no masses, no thyromegaly Respiratory: clear to auscultation bilaterally, no wheezing, no crackles. Normal respiratory effort. No accessory muscle use.  Cardiovascular: Regular rate and rhythm, no murmurs / rubs / gallops. No extremity edema. 2+ pedal pulses. No carotid bruits.  Abdomen: no tenderness, no masses palpated. No hepatosplenomegaly. Bowel sounds positive.  Musculoskeletal: no clubbing / cyanosis. No joint deformity upper and lower extremities. Good ROM, no contractures. Normal muscle tone.  Skin: no rashes, lesions, ulcers. No induration Neurologic: CN 2-12 grossly intact. Sensation intact, DTR normal. Strength 5/5 in all 4.  Psychiatric: Normal judgment and insight. Alert and oriented x 3. Normal mood.    Labs on Admission: I have personally reviewed following labs and imaging studies  CBC: Recent Labs  Lab 03/31/18 1825  WBC 9.3  NEUTROABS 7.4  HGB 12.1*  HCT 38.1*  MCV 97.7  PLT 101   Basic Metabolic Panel: Recent Labs  Lab 03/31/18 1825  NA 136  K 4.0  CL 106  CO2 19*  GLUCOSE 96  BUN 53*  CREATININE 5.48*  CALCIUM 8.5*   GFR: Estimated Creatinine Clearance: 14.3 mL/min (A) (by C-G formula based on SCr of 5.48 mg/dL (H)). Liver Function Tests: Recent Labs  Lab 03/31/18 1825  AST 10*  ALT 10  ALKPHOS 106  BILITOT 0.7  PROT 7.0  ALBUMIN 3.5   No results for input(s): LIPASE, AMYLASE in the  last 168 hours. No results for input(s): AMMONIA in the last 168 hours. Coagulation Profile: No results for input(s): INR, PROTIME in the last 168 hours. Cardiac Enzymes: No results for input(s): CKTOTAL, CKMB, CKMBINDEX, TROPONINI in the last 168 hours. BNP (last 3 results) No results for input(s): PROBNP in the last 8760 hours. HbA1C: No results for input(s): HGBA1C in the last 72 hours. CBG: No results for input(s): GLUCAP in the last 168 hours. Lipid Profile: No results for input(s): CHOL, HDL, LDLCALC, TRIG, CHOLHDL, LDLDIRECT in the last 72 hours. Thyroid Function Tests: No results for input(s): TSH, T4TOTAL, FREET4, T3FREE, THYROIDAB in the last 72 hours. Anemia Panel: No results for input(s): VITAMINB12, FOLATE, FERRITIN, TIBC, IRON, RETICCTPCT in the last 72 hours. Urine analysis:    Component Value Date/Time   COLORURINE AMBER (A) 03/31/2018 1801   APPEARANCEUR CLOUDY (A) 03/31/2018 1801   LABSPEC 1.011 03/31/2018 1801   PHURINE 6.0 03/31/2018 1801   GLUCOSEU NEGATIVE 03/31/2018 1801   HGBUR LARGE (A) 03/31/2018 1801   BILIRUBINUR NEGATIVE 03/31/2018 1801   KETONESUR NEGATIVE 03/31/2018 1801   PROTEINUR 100 (A) 03/31/2018 1801   UROBILINOGEN 0.2 03/03/2010 1056   NITRITE NEGATIVE 03/31/2018 1801   LEUKOCYTESUR LARGE (A) 03/31/2018 1801   Sepsis Labs: !!!!!!!!!!!!!!!!!!!!!!!!!!!!!!!!!!!!!!!!!!!! @LABRCNTIP (procalcitonin:4,lacticidven:4) )No results found for this or any previous visit (from the past 240 hour(s)).   Radiological Exams on Admission: Ct Renal Stone Study  Result Date: 03/31/2018 CLINICAL DATA:  Flank pain for 2 months. Hematuria for several days. Suspect stone disease. EXAM: CT ABDOMEN AND PELVIS WITHOUT CONTRAST TECHNIQUE: Multidetector CT imaging of the abdomen and pelvis was performed following the standard protocol without IV contrast. COMPARISON:  10/13/2017 FINDINGS: Lower chest: No acute findings. Hepatobiliary: No mass visualized on this  unenhanced exam. Several small fluid attenuation hepatic cysts again noted. Gallbladder is unremarkable. Pancreas: No mass or inflammatory process visualized on this unenhanced exam. Spleen:  Within normal limits in size. Adrenals/Urinary tract: Normal adrenal glands. Marked symmetric enlargement of  both kidneys is seen with diffuse involvement by innumerable water attenuation and hemorrhagic renal cysts, which are not significantly changed compared to prior study. These findings are consistent with autosomal dominant polycystic kidney disease. Several small less than 1 cm left renal calculi are seen. No evidence of ureteral calculi or hydronephrosis. Unremarkable unopacified urinary bladder. Stomach/Bowel: No evidence of obstruction, inflammatory process, or abnormal fluid collections. Diverticulosis is seen mainly involving the descending and sigmoid colon, however there is no evidence of diverticulitis. Vascular/Lymphatic: No pathologically enlarged lymph nodes identified. No evidence of abdominal aortic aneurysm. Aortic atherosclerosis. A stent again seen in the right external iliac artery. Reproductive:  No mass or other significant abnormality. Other:  None. Musculoskeletal: Previous interbody fusions again seen at L2-3 and L3-4. New destruction of intervertebral disc spaces and adjacent vertebral body endplates are seen at Y0-4 and L4-5 since prior study of 10/13/2017; infectious discitis can not be excluded. IMPRESSION: Autosomal dominant polycystic kidney disease, without significant change since prior study. Several small left renal calculi. No evidence of ureteral calculi or hydronephrosis. New destruction of intervertebral disc spaces and adjacent vertebral endplates at H9-9 and H7-4, suspicious for infectious discitis. Recommend clinical correlation and consider lumbar spine MRI without and with contrast for further evaluation. Electronically Signed   By: Earle Gell M.D.   On: 03/31/2018 20:38     Old chart reviewed Case discussed with Dr. Roderic Palau in the ED  Assessment/Plan 62 year old male with acute on chronic back pain with recent discitis Principal Problem:   Back pain-acute on chronic back pain.  Obtain MRI in the morning.  Patient has no neurological symptoms with it except sciatica down his right leg.  Obtain physical therapy evaluation.  Patient declined going to rehab for IV antibiotics and completed oral antibiotics instead which may not have completely treated his discitis.  Active Problems:   Essential hypertension-stable    COPD (chronic obstructive pulmonary disease) (HCC)-stable continue bronchodilators    Polycystic kidney disease-stable    CAD (coronary artery disease)-stable holding Plavix at this time    History of hepatitis C-stable    Paraspinal abscess (HCC)-completed treatment per patient last week    Discitis of lumbar region-again completed treatment last week.  Patient declined IV antibiotics for 6 weeks and said he received Augmentin and Cipro for 5 weeks    CKD (chronic kidney disease) stage 5, GFR less than 15 ml/min (HCC)-stable patient not uremic with adequate urinary output    Atrial fibrillation, chronic-currently rate controlled    Noncompliance with medication regimen-as above    DVT prophylaxis: SCDs Code Status: Full Family Communication: None Disposition Plan: 1 to 3 days Consults called: None Admission status: Admission   Saloma Cadena A MD [facility]  If 7PM-7AM, please contact night-coverage [email or web address] Password [email or web address]  03/31/2018, 10:11 PM

## 2018-03-31 NOTE — ED Notes (Signed)
Pt reports he has not taken his noon or even BP medications.

## 2018-03-31 NOTE — ED Notes (Signed)
Pt taken to CT at this time.

## 2018-03-31 NOTE — ED Triage Notes (Signed)
Pt c/o back pain x 2 mos, blood in urine x 3 days and fatigue/dizziness x 3-4 days

## 2018-03-31 NOTE — ED Notes (Signed)
Admitting MD at bedside.

## 2018-04-01 ENCOUNTER — Inpatient Hospital Stay (HOSPITAL_COMMUNITY): Payer: 59

## 2018-04-01 DIAGNOSIS — Z720 Tobacco use: Secondary | ICD-10-CM

## 2018-04-01 DIAGNOSIS — I10 Essential (primary) hypertension: Secondary | ICD-10-CM

## 2018-04-01 DIAGNOSIS — Z9114 Patient's other noncompliance with medication regimen: Secondary | ICD-10-CM

## 2018-04-01 DIAGNOSIS — R310 Gross hematuria: Secondary | ICD-10-CM

## 2018-04-01 DIAGNOSIS — Q613 Polycystic kidney, unspecified: Secondary | ICD-10-CM

## 2018-04-01 DIAGNOSIS — M4646 Discitis, unspecified, lumbar region: Secondary | ICD-10-CM

## 2018-04-01 LAB — BASIC METABOLIC PANEL
Anion gap: 12 (ref 5–15)
BUN: 53 mg/dL — ABNORMAL HIGH (ref 8–23)
CALCIUM: 8.3 mg/dL — AB (ref 8.9–10.3)
CO2: 16 mmol/L — ABNORMAL LOW (ref 22–32)
Chloride: 107 mmol/L (ref 98–111)
Creatinine, Ser: 5.43 mg/dL — ABNORMAL HIGH (ref 0.61–1.24)
GFR calc Af Amer: 12 mL/min — ABNORMAL LOW (ref >60)
GFR, EST NON AFRICAN AMERICAN: 10 mL/min — AB (ref >60)
Glucose, Bld: 81 mg/dL (ref 70–99)
Potassium: 4 mmol/L (ref 3.5–5.1)
Sodium: 135 mmol/L (ref 135–145)

## 2018-04-01 LAB — CBC
HCT: 34.4 % — ABNORMAL LOW (ref 39.0–52.0)
Hemoglobin: 11.1 g/dL — ABNORMAL LOW (ref 13.0–17.0)
MCH: 31.7 pg (ref 26.0–34.0)
MCHC: 32.3 g/dL (ref 30.0–36.0)
MCV: 98.3 fL (ref 80.0–100.0)
Platelets: 244 10*3/uL (ref 150–400)
RBC: 3.5 MIL/uL — ABNORMAL LOW (ref 4.22–5.81)
RDW: 14.3 % (ref 11.5–15.5)
WBC: 8.4 10*3/uL (ref 4.0–10.5)
nRBC: 0 % (ref 0.0–0.2)

## 2018-04-01 LAB — C-REACTIVE PROTEIN: CRP: 5.6 mg/dL — ABNORMAL HIGH (ref <1.0)

## 2018-04-01 LAB — SEDIMENTATION RATE: Sed Rate: 65 mm/hr — ABNORMAL HIGH (ref 0–16)

## 2018-04-01 MED ORDER — SODIUM BICARBONATE 650 MG PO TABS
650.0000 mg | ORAL_TABLET | Freq: Two times a day (BID) | ORAL | Status: DC
  Administered 2018-04-01 – 2018-04-04 (×7): 650 mg via ORAL
  Filled 2018-04-01 (×7): qty 1

## 2018-04-01 MED ORDER — SODIUM CHLORIDE 0.9 % IV SOLN
2.0000 g | Freq: Two times a day (BID) | INTRAVENOUS | Status: DC
  Administered 2018-04-01 – 2018-04-02 (×2): 2 g via INTRAVENOUS
  Filled 2018-04-01 (×2): qty 20
  Filled 2018-04-01: qty 2
  Filled 2018-04-01 (×5): qty 20

## 2018-04-01 MED ORDER — ENSURE ENLIVE PO LIQD
237.0000 mL | Freq: Three times a day (TID) | ORAL | Status: DC
  Administered 2018-04-01 – 2018-04-04 (×6): 237 mL via ORAL

## 2018-04-01 MED ORDER — VANCOMYCIN HCL 10 G IV SOLR
1500.0000 mg | Freq: Once | INTRAVENOUS | Status: AC
  Administered 2018-04-01: 1500 mg via INTRAVENOUS
  Filled 2018-04-01 (×2): qty 1500

## 2018-04-01 MED ORDER — SODIUM CHLORIDE 0.9 % IV SOLN
INTRAVENOUS | Status: AC
  Filled 2018-04-01: qty 20

## 2018-04-01 MED ORDER — SODIUM CHLORIDE 0.45 % IV SOLN
INTRAVENOUS | Status: AC
  Administered 2018-04-01: 12:00:00 via INTRAVENOUS
  Filled 2018-04-01: qty 1000

## 2018-04-01 MED ORDER — VANCOMYCIN HCL IN DEXTROSE 1-5 GM/200ML-% IV SOLN
1000.0000 mg | INTRAVENOUS | Status: DC
  Administered 2018-04-03: 1000 mg via INTRAVENOUS
  Filled 2018-04-01: qty 200

## 2018-04-01 MED ORDER — SODIUM CHLORIDE 0.45 % IV SOLN: INTRAVENOUS | Status: DC

## 2018-04-01 NOTE — Clinical Social Work Note (Signed)
Clinical Social Work Assessment  Patient Details  Name: Aaron [name] MRN: [id number] Date of Birth: 16-Jul-1955  Date of referral:  04/01/18               Reason for consult:  Facility Placement                Permission sought to share information with:    Permission granted to share information::     Name::        Agency::     Relationship::     Contact Information:     Housing/Transportation Living arrangements for the past 2 months:  Single Family Home Source of Information:  Patient Patient Interpreter Needed:  None Criminal Activity/Legal Involvement Pertinent to Current Situation/Hospitalization:  No - Comment as needed Significant Relationships:  Adult Children Lives with:  Self Do you feel safe going back to the place where you live?  Yes Need for family participation in patient care:  Yes (Comment)  Care giving concerns:  None identified at baseline.    Social Worker assessment / plan: At baseline, patient states that he ambulates with a cane or walker and mostly a cane.  He drives.  He stated that he is agreeable to go to short term rehab at [facility] and will go this time (patient was previously scheduled to discharge at [facility] and declined on the day of discharge).  LCSW faxed out referral to preferred facility.  Employment status:  Disabled (Comment on whether or not currently receiving Disability) Insurance information:  Managed Medicare PT Recommendations:  [facility] / Referral to community resources:  [place]  Patient/Family's Response to care:  Patient is agreeable to short term rehab at [facility].   Patient/Family's Understanding of and Emotional Response to Diagnosis, Current Treatment, and Prognosis:  Patient understands his diagnosis, treatment and prognosis and feels that he should discharge to SNF for short term rehab.   Emotional Assessment Appearance:  Appears stated age Attitude/Demeanor/Rapport:    Affect (typically  observed):  Accepting, Calm Orientation:  Oriented to Self, Oriented to Place, Oriented to  Time, Oriented to Situation Alcohol / Substance use:  Not Applicable Psych involvement (Current and /or in the community):     Discharge Needs  Concerns to be addressed:  Discharge Planning Concerns Readmission within the last 30 days:  No Current discharge risk:  None Barriers to Discharge:  No Barriers Identified   Ihor Gully, LCSW 04/01/2018, 12:50 PM

## 2018-04-01 NOTE — Care Management (Signed)
CM consulted for Home health.  Plan is for patient to go to SNF at time of DC. CM will sign off.

## 2018-04-01 NOTE — Consult Note (Signed)
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 Florentina Addison office) and recent history of lumbar discitis with paraspinal abscess for which he declined SNF placement/intravenous antibiotics and was discharged home with oral antibiotics.  He has prior history of poor adherence with dialysis/medical recommendations and is unclear whether he followed through with his antibiotic prescription.  On presentation, also reports a 4-day history of hematuria without any flank pain, fever, chills, dysuria or urgency.  He reports progressively worsening back pain with left lower extremity weakness and 3 falls over the past 2 days further injuring his back.  He denies any nausea, vomiting or diarrhea and does not have any dysgeusia but reports decreased appetite "because of back pain".  He denies any abnormal limb jerking movement except for isolated intermittent jerking movements of the left leg.  He denies any changes in sleep/wake pattern.  He denies taking any NSAIDs.  Radiological imaging yesterday showed new destruction of intervertebral disc spaces and adjacent vertebral endplates at F7-X0 and X8-B3 suspicious for infectious discitis and lumbar MRI pending.  Blood cultures pending along with urine culture and he is status post intravenous ceftriaxone.    02/05/2018  02/06/2018  02/07/2018  02/08/2018 03/31/2018  04/01/2018   BUN 39 (H) 45 (H) 51 (H) 59 (H) 53 (H) 53 (H)  Creatinine 4.27 (H) 4.30 (H) 4.25 (H) 4.67 (H) 5.48 (H) 5.43 (H)     Past Medical History:  Diagnosis Date  . Anxiety   . Arthritis    knees , back , shoulders  (10/12/2017)  . Atrial fibrillation ([place])   . CAD (coronary artery disease)    Mild nonobstructive disease at cardiac catheterization 2002  . Chronic back pain    "back of my neck; down thru my legs" (10/12/2017)  . COPD (chronic obstructive pulmonary disease) ([place])   . Diverticulitis   . Esophageal reflux   . ESRD (end stage renal disease) on dialysis [facility])    "TTS; [place]" (11/23/2017)  . Essential hypertension   . Hepatitis C    states he no longer has this  . History of kidney stones   . History of syncope   . Hyperlipidemia   . Jerking 09/23/2014  . Myocardial infarction ([place]) 02/2017   "light one" (10/12/2017)  . Non-compliant behavior    non compliant with diaylsis per daughter  . PAT (paroxysmal atrial tachycardia) ([place])   . Peripheral arterial disease ([place])    Occluded left superficial femoral artery status post stent June 2016 - Dr. Trula Slade  . Pneumonia 1961  . Polycystic kidney, unspecified type   . Syncope 09/2014  . SYNCOPE 05/07/2010   Qualifier: Diagnosis of  By: Laurance Flatten RN, BSN, Anderson Malta      Past Surgical History:  Procedure Laterality Date  . ABDOMINAL AORTOGRAM N/A 08/11/2017   Procedure: ABDOMINAL AORTOGRAM;  Surgeon: Serafina Mitchell, MD;  Location: [place] [facility];  Service: Cardiovascular;  Laterality: N/A;  . ANTERIOR CERVICAL DECOMP/DISCECTOMY FUSION    . APPLICATION OF WOUND VAC Right 08/14/2017   Procedure: APPLICATION OF PREVENA INCISIONAL WOUND VAC RIGHT GROIN;  Surgeon: Serafina Mitchell, MD;  Location: [place];  Service: Vascular;  Laterality: Right;  . AV  FISTULA PLACEMENT Left 09/04/2017   Procedure: ARTERIOVENOUS (AV) FISTULA CREATION LEFT UPPER ARM;  Surgeon: Serafina Mitchell, MD;  Location: [place];  Service: Vascular;  Laterality: Left;  . BACK SURGERY    . BASCILIC VEIN TRANSPOSITION Left 11/20/2017   Procedure: SECOND STAGE BASILIC VEIN TRANSPOSITION LEFT ARM;  Surgeon: Serafina Mitchell, MD;  Location: [place];  Service: Vascular;  Laterality: Left;  .  BIOPSY  12/17/2016   Procedure: BIOPSY;  Surgeon: Daneil Dolin, MD;  [facility];  Service: Gastroenterology;;  gastric colon  . BIOPSY  04/29/2017   Procedure: BIOPSY;  Surgeon: Daneil Dolin, MD;  [facility];  Service: Endoscopy;;  duodenal biopsies  . BUNIONECTOMY Bilateral   . COLONOSCOPY  2008   Dr. Oneida Alar: rare sigmoid colon diverticulosis, internal hemorrhoids.   . COLONOSCOPY WITH PROPOFOL N/A 12/17/2016   dense left-sided diverticulosis, right colon ulcers s/p biopsy query occult NSAID use vs transient ischemia, not consistent with IBD. CMV stains negative.   Marland Kitchen ENDARTERECTOMY FEMORAL Right 08/14/2017   Procedure: RIGHT ILLIO-FEMORAL ENDARTERECTOMY;  Surgeon: Serafina Mitchell, MD;  Location: [facility] OR;  Service: Vascular;  Laterality: Right;  . ESOPHAGEAL DILATION  12/17/2016   EGD with mild Schatzki's ring s/p dilatation, small hiatal hernia, erosive gastropathy (negative H.pylori gastritis)  . ESOPHAGOGASTRODUODENOSCOPY  2008   Dr. Oneida Alar: normal esophagus without Barrett's, antritis and duodenitis, path with H.pylori gastritis  . ESOPHAGOGASTRODUODENOSCOPY (EGD) WITH PROPOFOL N/A 12/17/2016   Procedure: ESOPHAGOGASTRODUODENOSCOPY (EGD) WITH PROPOFOL;  Surgeon: Daneil Dolin, MD;  [facility];  Service: Gastroenterology;  Laterality: N/A;  . ESOPHAGOGASTRODUODENOSCOPY (EGD) WITH PROPOFOL N/A 04/29/2017   Patchy erythema of gastric mucosa diffusely, extensive inflammatory changes in duodenum, geographic ulceration and mucosal edema present, encroaching somewhat on the lumen yet still widely patent, distal second portion of duodenum appeared abnormal, path with peptic duodenitis with ulceration  . GROIN DEBRIDEMENT Right 10/14/2017   Procedure: RIGHT GROIN AND RIGHT LOWER QUADRANT ABDOMEN DEBRIDEMENT WITH PLACEMENT OF ANTIBIOTIC BEADS;  Surgeon: Serafina Mitchell, MD;  Location: [place];  Service: Vascular;  Laterality: Right;  . HEMATOMA EVACUATION Left 12/06/2017    Procedure: EVACUATION HEMATOMA;  Surgeon: Waynetta Sandy, MD;  Location: [place];  Service: Vascular;  Laterality: Left;  . HERNIA REPAIR  1025   umbilical  . I&D EXTREMITY Right 09/21/2017   Procedure: IRRIGATION AND DEBRIDEMENT GROIN;  Surgeon: Elam Dutch, MD;  Location: [facility] OR;  Service: Vascular;  Laterality: Right;  . I&D EXTREMITY Left 11/23/2017   Procedure: Evacuation Hematoma LEFT UPPER ARM GRAFT;  Surgeon: Elam Dutch, MD;  Location: [place];  Service: Vascular;  Laterality: Left;  . INGUINAL HERNIA REPAIR Right 02/2017   [place]  . INSERTION OF DIALYSIS CATHETER Right 09/04/2017   Procedure: INSERTION OF TUNNELED  DIALYSIS CATHETER - RIGHT INTERNAL JUGULAR PLACEMENT;  Surgeon: Serafina Mitchell, MD;  Location: [place];  Service: Vascular;  Laterality: Right;  . INSERTION OF ILIAC STENT Right 08/14/2017   Procedure: INSERTION OF RIGHT COMMON ILIAC STENT 31mm x 28mm x 130cm INSERTION OF RIGHT EXTERNAL ILIAC STENT 51mm x 7mm x 130cm INSERTION OF SUPERFICIAL FERMORAL ARTERY STENT 34mm x 71mm x 130cm;  Surgeon: Serafina Mitchell, MD;  Location: [facility] OR;  Service: Vascular;  Laterality: Right;  . IR FLUORO GUIDE CV LINE RIGHT  08/31/2017  . IR US GUIDE VASC ACCESS RIGHT  08/31/2017  . LOWER EXTREMITY ANGIOGRAPHY Right 08/11/2017   Procedure:  LOWER EXTREMITY ANGIOGRAPHY;  Surgeon: Serafina Mitchell, MD;  Location: [place] [facility];  Service: Cardiovascular;  Laterality: Right;  . PATCH ANGIOPLASTY Right 08/14/2017   Procedure: PATCH ANGIOPLASTY USING HEMASHIELD PATCH 0.3IN Lillie [place];  Surgeon: Serafina Mitchell, MD;  Location: [facility];  Service: Vascular;  Laterality: Right;  . PERIPHERAL VASCULAR CATHETERIZATION N/A 09/20/2014   Procedure: Abdominal Aortogram;  Surgeon: Serafina Mitchell, MD;  Location: [place] [facility];  Service: Cardiovascular;  Laterality: N/A;  . POSTERIOR FUSION LUMBAR SPINE    . TEE WITHOUT CARDIOVERSION N/A 01/12/2018   Procedure: TRANSESOPHAGEAL ECHOCARDIOGRAM (TEE);   Surgeon: Lelon Perla, MD;  Location: [facility] END[facility]COPY;  Service: Cardiovascular;  Laterality: N/A;    Family History  Problem Relation Age of Onset  . Alcoholism Mother   . Heart disease Father        Massive heart attack  . Heart attack Father   . Atrial fibrillation Father   . Colon cancer Father   . Colon cancer Maternal Grandfather 76  . Alcoholism Maternal Grandfather   . Renal cancer Cousin   . Ovarian cancer Sister     Social History:  reports that he has been smoking cigarettes. He has a 94.00 pack-year smoking history. He has never used smokeless tobacco. He reports previous alcohol use. He reports previous drug use.  Allergies: No Known Allergies  Medications:  Scheduled: . diltiazem  360 mg Oral Daily  . feeding supplement (ENSURE ENLIVE)  237 mL Oral BID BM  . furosemide  40 mg Oral Daily  . hydrALAZINE  50 mg Oral Q8H  . metoprolol tartrate  100 mg Oral BID  . pantoprazole  40 mg Oral BID  . sodium chloride flush  3 mL Intravenous Q12H    BMP Latest Ref Rng & Units 04/01/2018 03/31/2018 02/08/2018  Glucose 70 - 99 mg/dL 81 96 104(H)  BUN 8 - 23 mg/dL 53(H) 53(H) 59(H)  Creatinine 0.61 - 1.24 mg/dL 5.43(H) 5.48(H) 4.67(H)  Sodium 135 - 145 mmol/L 135 136 139  Potassium 3.5 - 5.1 mmol/L 4.0 4.0 3.5  Chloride 98 - 111 mmol/L 107 106 100  CO2 22 - 32 mmol/L 16(L) 19(L) 25  Calcium 8.9 - 10.3 mg/dL 8.3(L) 8.5(L) 8.3(L)   CBC Latest Ref Rng & Units 04/01/2018 03/31/2018 02/08/2018  WBC 4.0 - 10.5 K/uL 8.4 9.3 9.0  Hemoglobin 13.0 - 17.0 g/dL 11.1(L) 12.1(L) 9.6(L)  Hematocrit 39.0 - 52.0 % 34.4(L) 38.1(L) 31.4(L)  Platelets 150 - 400 K/uL 244 259 325     Mr Lumbar Spine Wo Contrast  Result Date: 04/01/2018 CLINICAL DATA:  Low back pain radiating down both legs for 2 months. No discitis osteomyelitis with antibiotics completed last week EXAM: MRI LUMBAR SPINE WITHOUT CONTRAST TECHNIQUE: Multiplanar, multisequence MR imaging of the lumbar spine was  performed. No intravenous contrast was administered. COMPARISON:  Abdominal CT from yesterday.  Lumbar MRI 01/06/2018 FINDINGS: Segmentation:  5 lumbar type vertebral bodies Alignment:  Reversal of lumbar lordosis.  L5-S1 retrolisthesis Vertebrae: T2 hyperintensity within the disc spaces of L1-2 and L4-5, with marrow edema and endplate destruction that has significantly progressed at L1-2-leading to kyphotic deformity and endplate ridging. Less extensive progression at L4-5. There is still paravertebral inflammation without evident collection. Periarticular edema about the L1-2 and L4-5 facets which may be related to altered weight-bearing as there is no underlying facet joint destruction to imply facet arthritis. Conus medullaris and cauda equina: Conus extends to the L1 level. Conus and  cauda equina appear normal. Paraspinal and other soft tissues: Paravertebral/psoas edema as noted above. 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 Disc levels: T12- L1: Unremarkable. L1-L2: Infectious disc collapse with endplate ridging. There is moderate spinal stenosis. Advanced right foraminal stenosis with L1 compression. L2-L3: Remote PLIF.  No solid arthrodesis by CT.  No impingement L3-L4: Remote PLIF.  No solid arthrodesis by CT.  No impingement L4-L5: Discitis osteomyelitis. Degenerative facet spurring and degenerative ridging. No impingement L5-S1:Disc narrowing and bulging with retrolisthesis. Degenerative facet spurring. Moderate right foraminal stenosis. IMPRESSION: 1. 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 Degree of residual marrow and paravertebral edema is compatible with ongoing inflammation/infection. There is advanced right foraminal impingement at L1-2 from disc collapse. No evident collection. 2. Chronic findings described above. Electronically Signed   By: Monte Fantasia M.D.   On: 04/01/2018 09:03   Ct Renal Stone  Study  Result Date: 03/31/2018 CLINICAL DATA:  Flank pain for 2 months. Hematuria for several days. Suspect stone disease. EXAM: CT ABDOMEN AND PELVIS WITHOUT CONTRAST TECHNIQUE: Multidetector CT imaging of the abdomen and pelvis was performed following the standard protocol without IV contrast. COMPARISON:  10/13/2017 FINDINGS: Lower chest: No acute findings. Hepatobiliary: No mass visualized on this unenhanced exam. Several small fluid attenuation hepatic cysts again noted. Gallbladder is unremarkable. Pancreas: No mass or inflammatory process visualized on this unenhanced exam. Spleen:  Within normal limits in size. Adrenals/Urinary tract: Normal adrenal glands. Marked symmetric enlargement of both kidneys is seen with diffuse involvement by innumerable water attenuation and hemorrhagic renal cysts, which are not significantly changed compared to prior study. These findings are consistent with autosomal dominant polycystic kidney disease. Several small less than 1 cm left renal calculi are seen. No evidence of ureteral calculi or hydronephrosis. Unremarkable unopacified urinary bladder. Stomach/Bowel: No evidence of obstruction, inflammatory process, or abnormal fluid collections. Diverticulosis is seen mainly involving the descending and sigmoid colon, however there is no evidence of diverticulitis. Vascular/Lymphatic: No pathologically enlarged lymph nodes identified. No evidence of abdominal aortic aneurysm. Aortic atherosclerosis. A stent again seen in the right external iliac artery. Reproductive:  No mass or other significant abnormality. Other:  None. Musculoskeletal: Previous interbody fusions again seen at L2-3 and L3-4. New destruction of intervertebral disc spaces and adjacent vertebral body endplates are seen at E4-2 and L4-5 since prior study of 10/13/2017; infectious discitis can not be excluded. IMPRESSION: Autosomal dominant polycystic kidney disease, without significant change since prior  study. Several small left renal calculi. No evidence of ureteral calculi or hydronephrosis. New destruction of intervertebral disc spaces and adjacent vertebral endplates at P5-3 and I1-4, suspicious for infectious discitis. Recommend clinical correlation and consider lumbar spine MRI without and with contrast for further evaluation. Electronically Signed   By: Earle Gell M.D.   On: 03/31/2018 20:38    Review of Systems  Constitutional: Negative.   HENT: Negative.   Eyes: Negative.   Respiratory: Negative.   Cardiovascular: Negative.   Gastrointestinal: Negative.   Genitourinary: Positive for hematuria. Negative for dysuria, flank pain and urgency.  Musculoskeletal: Positive for back pain.  Skin: Negative.   Neurological: Positive for focal weakness and weakness. Negative for dizziness and headaches.       Left leg-chronic  Psychiatric/Behavioral: The patient is nervous/anxious.    Blood pressure (!) 142/107, pulse 88, temperature 97.9 F (36.6 C), temperature source Oral, resp. rate 16, height 6\' 4"  (1.93 m), weight 70 kg,  SpO2 98 %. Physical Exam  Nursing note and vitals reviewed. Constitutional: He is oriented to person, place, and time. He appears well-developed and well-nourished. No distress.  HENT:  Head: Normocephalic.  Mouth/Throat: Oropharynx is clear and moist. No oropharyngeal exudate.  Eyes: Pupils are equal, round, and reactive to light. Conjunctivae and EOM are normal. No scleral icterus.  Neck: Normal range of motion. Neck supple. No JVD present.  Cardiovascular: Normal rate and normal heart sounds.  No murmur heard. Irregularly irregular  Respiratory: Effort normal. He has no wheezes. He has no rales.  Distant breath sounds bilaterally  GI: Soft. Bowel sounds are normal. He exhibits no distension. There is no abdominal tenderness.  Musculoskeletal: Normal range of motion.        General: No edema.  Neurological: He is alert and oriented to person, place, and time.   Skin: Skin is warm and dry. No rash noted.    Assessment/Plan: 1.  Acute kidney injury on chronic kidney disease stage IV-V: He is nonoliguric per his account (states urine output incompletely charted) and does not have any acute indications for dialysis from labs or clinical exam.  He is euvolemic to slightly hypovolemic and I will prescribe for some limited fluids to help with volume expansion.  Hematuria possibly related to UTI versus hemorrhagic cyst rupture that may be a component of his back pain.  We will continue to follow closely as we monitor for acute indications for dialysis.  Continue to avoid iodinated intravenous contrast, avoid hypotension and NSAIDs. 2.  Acute on chronic back pain: CT scan of the abdomen suggesting lumbar discitis with vertebral osteomyelitis for which additional imaging with MRI has been ordered.  He will likely need prolonged course of intravenous antibiotics.  Cultures pending. 3.  Hypertension: Elevated blood pressures noted, resumed on metoprolol and diltiazem; monitor with intravenous fluids. 4.  Anemia: With anemia of chronic kidney disease exacerbated by recent hematuria, follow hemoglobin/hematocrit trend.  Reports that his hematuria is improving. 5.  Chronic atrial fibrillation: He is currently rate controlled and otherwise hemodynamically stable.  Warfarin on hold secondary to hematuria. 6.  Hematuria: Suspected to be from urinary tract infection with urine cultures pending and status post intravenous ceftriaxone.  Given prior history of polycystic kidney disease, possibility of hemorrhagic cyst rupture into urinary tract is also differential. Alyne Martinson K. 04/01/2018, 9:36 AM

## 2018-04-01 NOTE — Consult Note (Signed)
Urology Consult  CC: Referring physician: Orson Eva, MD Reason for referral: Gross hematuria  Impression/Assessment: 1.  Gross hematuria: The etiology of his hematuria is currently unclear.  He did have stones noted within his left kidney but these were peripherally located and not causing any obstruction and there were no ureteral calculi.  His urine had red and white cells but did not appear infected although the urine culture remains pending at this time.  I noted no lesions within the bladder on his CT scan although this does not rule out the presence of a small bladder lesion which could be evaluated further with cystoscopy.  Finally he has multiple bilateral renal cyst consistent with his polycystic kidney disease although the cysts appear stable compared to his previous CT scan there is also a remote possibility of a cyst rupture into the collecting system resulting in hematuria.  He is not having any flank pain at this time.  2.  Left renal calculi: These were incidentally noted on his CT scan and are nonobstructing.   Plan:  1.  Ceftriaxone should be adequate coverage while awaiting final urine culture results. 2.  His urine is clearing spontaneously.  With no clots I would not recommend placement of a Foley catheter. 3.  Would continue to hold Lovenox until urine clears. 4.  Will plan to see him as an outpatient for cystoscopy.   History of Present Illness: Mr. Nasca is a 62 year old male who is seen in hospital consultation today for further evaluation of gross, painless hematuria.  He has a history of autosomal dominant polycystic kidney disease with resultant progressive renal failure.  He reports that 4 days ago he began to experience gross hematuria.  This was not associated with any dysuria and he also reports there were no changes in his voiding pattern such as increased frequency, urgency or change in his nocturia which he indicates is 2 times at baseline.  He has no prior  history of urinary tract infections or prostatitis.  His urinalysis on admission had white blood cells and red blood cells but few bacteria.  A urine culture was performed and remains pending.  He reports that he is able to urinate without difficulty and has not been passing any clots.  He also reports that his urine does appear to be clearing although it is still bloody.  He denies any flank pain.  His gross hematuria would be considered a moderate severity with no modifying factors or other associated signs and symptoms.  Past Medical History:  Diagnosis Date  . Anxiety   . Arthritis    knees , back , shoulders (10/12/2017)  . Atrial fibrillation ([place])   . CAD (coronary artery disease)    Mild nonobstructive disease at cardiac catheterization 2002  . Chronic back pain    "back of my neck; down thru my legs" (10/12/2017)  . COPD (chronic obstructive pulmonary disease) ([place])   . Diverticulitis   . Esophageal reflux   . ESRD (end stage renal disease) on dialysis [facility])    "TTS; [place]" (11/23/2017)  . Essential hypertension   . Hepatitis C    states he no longer has this  . History of kidney stones   . History of syncope   . Hyperlipidemia   . Jerking 09/23/2014  . Myocardial infarction ([place]) 02/2017   "light one" (10/12/2017)  . Non-compliant behavior    non compliant with diaylsis per daughter  . PAT (paroxysmal atrial tachycardia) ([place])   .  Peripheral arterial disease ([place])    Occluded left superficial femoral artery status post stent June 2016 - Dr. Trula Slade  . Pneumonia 1961  . Polycystic kidney, unspecified type   . Syncope 09/2014  . SYNCOPE 05/07/2010   Qualifier: Diagnosis of  By: Laurance Flatten RN, BSN, Anderson Malta     Past Surgical History:  Procedure Laterality Date  . ABDOMINAL AORTOGRAM N/A 08/11/2017   Procedure: ABDOMINAL AORTOGRAM;  Surgeon: Serafina Mitchell, MD;  Location: [place] [facility];  Service: Cardiovascular;  Laterality: N/A;  . ANTERIOR CERVICAL DECOMP/DISCECTOMY FUSION      . APPLICATION OF WOUND VAC Right 08/14/2017   Procedure: APPLICATION OF PREVENA INCISIONAL WOUND VAC RIGHT GROIN;  Surgeon: Serafina Mitchell, MD;  [facility]: [facility];  Service: Vascular;  Laterality: Right;  . AV FISTULA PLACEMENT Left 09/04/2017   Procedure: ARTERIOVENOUS (AV) FISTULA CREATION LEFT UPPER ARM;  Surgeon: Serafina Mitchell, MD;  Location: [place];  Service: Vascular;  Laterality: Left;  . BACK SURGERY    . BASCILIC VEIN TRANSPOSITION Left 11/20/2017   Procedure: SECOND STAGE BASILIC VEIN TRANSPOSITION LEFT ARM;  Surgeon: Serafina Mitchell, MD;  Location: [place];  Service: Vascular;  Laterality: Left;  . BIOPSY  12/17/2016   Procedure: BIOPSY;  Surgeon: Daneil Dolin, MD;  [facility];  Service: Gastroenterology;;  gastric colon  . BIOPSY  04/29/2017   Procedure: BIOPSY;  Surgeon: Daneil Dolin, MD;  [facility];  Service: Endoscopy;;  duodenal biopsies  . BUNIONECTOMY Bilateral   . COLONOSCOPY  2008   Dr. Oneida Alar: rare sigmoid colon diverticulosis, internal hemorrhoids.   . COLONOSCOPY WITH PROPOFOL N/A 12/17/2016   dense left-sided diverticulosis, right colon ulcers s/p biopsy query occult NSAID use vs transient ischemia, not consistent with IBD. CMV stains negative.   Marland Kitchen ENDARTERECTOMY FEMORAL Right 08/14/2017   Procedure: RIGHT ILLIO-FEMORAL ENDARTERECTOMY;  Surgeon: Serafina Mitchell, MD;  Location: [facility] OR;  Service: Vascular;  Laterality: Right;  . ESOPHAGEAL DILATION  12/17/2016   EGD with mild Schatzki's ring s/p dilatation, small hiatal hernia, erosive gastropathy (negative H.pylori gastritis)  . ESOPHAGOGASTRODUODENOSCOPY  2008   Dr. Oneida Alar: normal esophagus without Barrett's, antritis and duodenitis, path with H.pylori gastritis  . ESOPHAGOGASTRODUODENOSCOPY (EGD) WITH PROPOFOL N/A 12/17/2016   Procedure: ESOPHAGOGASTRODUODENOSCOPY (EGD) WITH PROPOFOL;  Surgeon: Daneil Dolin, MD;  [facility];  Service: Gastroenterology;  Laterality: N/A;  .  ESOPHAGOGASTRODUODENOSCOPY (EGD) WITH PROPOFOL N/A 04/29/2017   Patchy erythema of gastric mucosa diffusely, extensive inflammatory changes in duodenum, geographic ulceration and mucosal edema present, encroaching somewhat on the lumen yet still widely patent, distal second portion of duodenum appeared abnormal, path with peptic duodenitis with ulceration  . GROIN DEBRIDEMENT Right 10/14/2017   Procedure: RIGHT GROIN AND RIGHT LOWER QUADRANT ABDOMEN DEBRIDEMENT WITH PLACEMENT OF ANTIBIOTIC BEADS;  Surgeon: Serafina Mitchell, MD;  Location: [place];  Service: Vascular;  Laterality: Right;  . HEMATOMA EVACUATION Left 12/06/2017   Procedure: EVACUATION HEMATOMA;  Surgeon: Waynetta Sandy, MD;  Location: [place];  Service: Vascular;  Laterality: Left;  . HERNIA REPAIR  2637   umbilical  . I&D EXTREMITY Right 09/21/2017   Procedure: IRRIGATION AND DEBRIDEMENT GROIN;  Surgeon: Elam Dutch, MD;  Location: [facility] OR;  Service: Vascular;  Laterality: Right;  . I&D EXTREMITY Left 11/23/2017   Procedure: Evacuation Hematoma LEFT UPPER ARM GRAFT;  Surgeon: Elam Dutch, MD;  Location: [place];  Service: Vascular;  Laterality: Left;  .  INGUINAL HERNIA REPAIR Right 02/2017   [place]  . INSERTION OF DIALYSIS CATHETER Right 09/04/2017   Procedure: INSERTION OF TUNNELED  DIALYSIS CATHETER - RIGHT INTERNAL JUGULAR PLACEMENT;  Surgeon: Serafina Mitchell, MD;  Location: [place];  Service: Vascular;  Laterality: Right;  . INSERTION OF ILIAC STENT Right 08/14/2017   Procedure: INSERTION OF RIGHT COMMON ILIAC STENT 46mm x 27mm x 130cm INSERTION OF RIGHT EXTERNAL ILIAC STENT 86mm x 71mm x 130cm INSERTION OF SUPERFICIAL FERMORAL ARTERY STENT 64mm x 40mm x 130cm;  Surgeon: Serafina Mitchell, MD;  Location: [facility] OR;  Service: Vascular;  Laterality: Right;  . IR FLUORO GUIDE CV LINE RIGHT  08/31/2017  . IR US GUIDE VASC ACCESS RIGHT  08/31/2017  . LOWER EXTREMITY ANGIOGRAPHY Right 08/11/2017   Procedure: LOWER EXTREMITY ANGIOGRAPHY;   Surgeon: Serafina Mitchell, MD;  Location: [place] [facility];  Service: Cardiovascular;  Laterality: Right;  . PATCH ANGIOPLASTY Right 08/14/2017   Procedure: PATCH ANGIOPLASTY USING HEMASHIELD PATCH 0.3IN Lillie Columbia;  Surgeon: Serafina Mitchell, MD;  [facility];  Service: Vascular;  Laterality: Right;  . PERIPHERAL VASCULAR CATHETERIZATION N/A 09/20/2014   Procedure: Abdominal Aortogram;  Surgeon: Serafina Mitchell, MD;  Location: [place] [facility];  Service: Cardiovascular;  Laterality: N/A;  . POSTERIOR FUSION LUMBAR SPINE    . TEE WITHOUT CARDIOVERSION N/A 01/12/2018   Procedure: TRANSESOPHAGEAL ECHOCARDIOGRAM (TEE);  Surgeon: Lelon Perla, MD;  Location: [facility] ENDOSCOPY;  Service: Cardiovascular;  Laterality: N/A;    Medications:  Scheduled: . diltiazem  360 mg Oral Daily  . feeding supplement (ENSURE ENLIVE)  237 mL Oral TID BM  . furosemide  40 mg Oral Daily  . hydrALAZINE  50 mg Oral Q8H  . metoprolol tartrate  100 mg Oral BID  . pantoprazole  40 mg Oral BID  . sodium bicarbonate  650 mg Oral BID  . sodium chloride flush  3 mL Intravenous Q12H   Continuous: . sodium chloride    . sodium chloride 0.45 % 1,000 mL with sodium bicarbonate 75 mEq infusion 100 mL/hr at 04/01/18 1146    Allergies: No Known Allergies  Family History  Problem Relation Age of Onset  . Alcoholism Mother   . Heart disease Father        Massive heart attack  . Heart attack Father   . Atrial fibrillation Father   . Colon cancer Father   . Colon cancer Maternal Grandfather 69  . Alcoholism Maternal Grandfather   . Renal cancer Cousin   . Ovarian cancer Sister     Social History:  reports that he has been smoking cigarettes. He has a 94.00 pack-year smoking history. He has never used smokeless tobacco. He reports previous alcohol use. He reports previous drug use.  Review of Systems (10 point): Pertinent items are noted in HPI. A comprehensive review of systems was negative except as noted above.   He does however have chronic back pain.  Physical Exam:  Vital signs in last 24 hours: Temp:  [97.7 F (36.5 C)-99 F (37.2 C)] 97.7 F (36.5 C) (12/19 1426) Pulse Rate:  [59-91] 59 (12/19 1426) Resp:  [16-19] 19 (12/19 1426) BP: (142-168)/(99-107) 155/100 (12/19 1426) SpO2:  [98 %-100 %] 99 % (12/19 1426) Weight:  [70 kg] 70 kg (12/18 2234) General appearance: alert and appears stated age Head: Normocephalic, without obvious abnormality, atraumatic Eyes: conjunctivae/corneas clear. EOM's intact.  Oropharynx: moist mucous membranes Neck: supple, symmetrical, trachea midline Resp: normal respiratory  effort Cardio: regular rate and rhythm Back: symmetric, no curvature. ROM normal. No CVA tenderness. GI: soft, non-tender; bowel sounds normal; no masses,  no organomegaly  Male genitalia: penis: normal male phallus with no lesions or discharge.he has excessive skin in the area of the frenulum but this does not appear to be infectious in etiology.  Testes: bilaterally descended with no masses or tenderness. no hernias  Extremities: extremities normal, atraumatic, no cyanosis or edema Skin: Skin color normal. No visible rashes or lesions Neurologic: Grossly normal  Laboratory Data:  Recent Labs    03/31/18 1825 04/01/18 0507  WBC 9.3 8.4  HGB 12.1* 11.1*  HCT 38.1* 34.4*   BMET Recent Labs    03/31/18 1825 04/01/18 0507  NA 136 135  K 4.0 4.0  CL 106 107  CO2 19* 16*  GLUCOSE 96 81  BUN 53* 53*  CREATININE 5.48* 5.43*  CALCIUM 8.5* 8.3*   No results for input(s): LABPT, INR in the last 72 hours. No results for input(s): LABURIN in the last 72 hours. Results for orders placed or performed during the hospital encounter of 03/31/18  Culture, blood (Routine X 2) w Reflex to ID Panel     Status: None (Preliminary result)   Collection Time: 04/01/18  7:20 AM  Result Value Ref Range Status   Specimen Description BLOOD RIGHT HAND  Final   Special Requests   Final     BOTTLES DRAWN AEROBIC AND ANAEROBIC Blood Culture adequate volume   Culture   Final    NO GROWTH < 12 HOURS Performed at [facility], [address], [place], [place] [address]    Report Status PENDING  Incomplete  Culture, blood (Routine X 2) w Reflex to ID Panel     Status: None (Preliminary result)   Collection Time: 04/01/18  7:39 AM  Result Value Ref Range Status   Specimen Description BLOOD RIGHT ARM UPPER  Final   Special Requests   Final    BOTTLES DRAWN AEROBIC AND ANAEROBIC Blood Culture adequate volume   Culture   Final    NO GROWTH < 12 HOURS Performed at [facility], [address] [place], [place] [address]    Report Status PENDING  Incomplete   Creatinine: Recent Labs    03/31/18 1825 04/01/18 0507  CREATININE 5.48* 5.43*    Imaging: Mr Lumbar Spine Wo Contrast  Result Date: 04/01/2018 CLINICAL DATA:  Low back pain radiating down both legs for 2 months. No discitis osteomyelitis with antibiotics completed last week EXAM: MRI LUMBAR SPINE WITHOUT CONTRAST TECHNIQUE: Multiplanar, multisequence MR imaging of the lumbar spine was performed. No intravenous contrast was administered. COMPARISON:  Abdominal CT from yesterday.  Lumbar MRI 01/06/2018 FINDINGS: Segmentation:  5 lumbar type vertebral bodies Alignment:  Reversal of lumbar lordosis.  L5-S1 retrolisthesis Vertebrae: T2 hyperintensity within the disc spaces of L1-2 and L4-5, with marrow edema and endplate destruction that has significantly progressed at L1-2-leading to kyphotic deformity and endplate ridging. Less extensive progression at L4-5. There is still paravertebral inflammation without evident collection. Periarticular edema about the L1-2 and L4-5 facets which may be related to altered weight-bearing as there is no underlying facet joint destruction to imply facet arthritis. Conus medullaris and cauda equina: Conus extends to the L1 level. Conus and cauda equina appear normal. Paraspinal and other soft  tissues: Paravertebral/psoas edema as noted above. 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 Disc levels: T12- L1: Unremarkable. L1-L2: Infectious disc collapse with endplate ridging. There is  moderate spinal stenosis. Advanced right foraminal stenosis with L1 compression. L2-L3: Remote PLIF.  No solid arthrodesis by CT.  No impingement L3-L4: Remote PLIF.  No solid arthrodesis by CT.  No impingement L4-L5: Discitis osteomyelitis. Degenerative facet spurring and degenerative ridging. No impingement L5-S1:Disc narrowing and bulging with retrolisthesis. Degenerative facet spurring. Moderate right foraminal stenosis. IMPRESSION: 1. 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 Degree of residual marrow and paravertebral edema is compatible with ongoing inflammation/infection. There is advanced right foraminal impingement at L1-2 from disc collapse. No evident collection. 2. Chronic findings described above. Electronically Signed   By: Monte Fantasia M.D.   On: 04/01/2018 09:03   Ct Renal Stone Study  Result Date: 03/31/2018 CLINICAL DATA:  Flank pain for 2 months. Hematuria for several days. Suspect stone disease. EXAM: CT ABDOMEN AND PELVIS WITHOUT CONTRAST TECHNIQUE: Multidetector CT imaging of the abdomen and pelvis was performed following the standard protocol without IV contrast. COMPARISON:  10/13/2017 FINDINGS: Lower chest: No acute findings. Hepatobiliary: No mass visualized on this unenhanced exam. Several small fluid attenuation hepatic cysts again noted. Gallbladder is unremarkable. Pancreas: No mass or inflammatory process visualized on this unenhanced exam. Spleen:  Within normal limits in size. Adrenals/Urinary tract: Normal adrenal glands. Marked symmetric enlargement of both kidneys is seen with diffuse involvement by innumerable water attenuation and hemorrhagic renal cysts, which are not significantly changed  compared to prior study. These findings are consistent with autosomal dominant polycystic kidney disease. Several small less than 1 cm left renal calculi are seen. No evidence of ureteral calculi or hydronephrosis. Unremarkable unopacified urinary bladder. Stomach/Bowel: No evidence of obstruction, inflammatory process, or abnormal fluid collections. Diverticulosis is seen mainly involving the descending and sigmoid colon, however there is no evidence of diverticulitis. Vascular/Lymphatic: No pathologically enlarged lymph nodes identified. No evidence of abdominal aortic aneurysm. Aortic atherosclerosis. A stent again seen in the right external iliac artery. Reproductive:  No mass or other significant abnormality. Other:  None. Musculoskeletal: Previous interbody fusions again seen at L2-3 and L3-4. New destruction of intervertebral disc spaces and adjacent vertebral body endplates are seen at X4-8 and L4-5 since prior study of 10/13/2017; infectious discitis can not be excluded. IMPRESSION: Autosomal dominant polycystic kidney disease, without significant change since prior study. Several small left renal calculi. No evidence of ureteral calculi or hydronephrosis. New destruction of intervertebral disc spaces and adjacent vertebral endplates at A1-6 and P5-3, suspicious for infectious discitis. Recommend clinical correlation and consider lumbar spine MRI without and with contrast for further evaluation. Electronically Signed   By: Earle Gell M.D.   On: 03/31/2018 20:38   His CT scan images were independently reviewed.    Claybon Jabs 04/01/2018, 4:44 PM

## 2018-04-01 NOTE — Progress Notes (Signed)
Initial Nutrition Assessment  DOCUMENTATION CODES:   Severe malnutrition in context of chronic illness  INTERVENTION:  Ensure Enlive po TID, each supplement provides 350 kcal and 20 grams of protein. He prefers [employer].  Recommend liberalize CHO modified restriction  Suggest consider renal vitamin daily  NUTRITION DIAGNOSIS:   Severe Malnutrition related to chronic illness, poor appetite(ESRD ) as evidenced by severe fat depletion, severe muscle depletion, percent weight loss, per patient/family report.   GOAL:  Patient will meet greater than or equal to 90% of their needs   MONITOR:   Supplement acceptance, PO intake, Weight trends, Labs, I & O's    REASON FOR ASSESSMENT:   Malnutrition Screening Tool    ASSESSMENT:  Patient is a  62 yo male with hx of CKD-5, Anemia, COPD, Hepatis C, Arthritis,Chronic back pain. Patient hospitalized 10/20-10/28 due to acute respiratory failure, lumbar discitis and paraspinal abscess. Non-compliance noted. Refused dialysis previously but patient says he is ready to do whatever he needs to get better.   Patient says he lives alone but his daughter will sometimes bring in food for him. He usually eats eggs for breakfast, toast, juice. Sometimes his daughter will bring [place] food if he calls. Most days he will just drink a beverage and not eat a meal. Patient says he has no appetite. Not surprising given his diminished renal function and recent history of lumbar discitis/abcess.   Meal intake 50% of breakfast today. Patient says breakfast is usually his best meal.   Severe weight loss 20 kg (22%) the past 5 months. Earlier this year pt reported to weigh as much as 245 lb. Chronic inadequate oral intake documented by previous RD assessments at least the past 5-6 months.   Medications reviewed and include: Ensure Enlive, Lasix, Lopressor, Protonix, Cardizem  Labs: BMP Latest Ref Rng & Units 04/01/2018 03/31/2018 02/08/2018  Glucose  70 - 99 mg/dL 81 96 104(H)  BUN 8 - 23 mg/dL 53(H) 53(H) 59(H)  Creatinine 0.61 - 1.24 mg/dL 5.43(H) 5.48(H) 4.67(H)  Sodium 135 - 145 mmol/L 135 136 139  Potassium 3.5 - 5.1 mmol/L 4.0 4.0 3.5  Chloride 98 - 111 mmol/L 107 106 100  CO2 22 - 32 mmol/L 16(L) 19(L) 25  Calcium 8.9 - 10.3 mg/dL 8.3(L) 8.5(L) 8.3(L)    NUTRITION - FOCUSED PHYSICAL EXAM:    Most Recent Value  Orbital Region  Moderate depletion  Upper Arm Region  Severe depletion  Thoracic and Lumbar Region  Moderate depletion  Buccal Region  Moderate depletion  Temple Region  Mild depletion  Clavicle Bone Region  Moderate depletion  Clavicle and Acromion Bone Region  Moderate depletion  Dorsal Hand  Severe depletion  Patellar Region  Severe depletion  Anterior Thigh Region  Severe depletion  Posterior Calf Region  Severe depletion  Edema (RD Assessment)  None  Hair  Reviewed  Eyes  Reviewed  Skin  Reviewed  Nails  Reviewed      Diet Order:   Diet Order            Diet heart healthy/carb modified Room service appropriate? Yes; Fluid consistency: Thin  Diet effective now              EDUCATION NEEDS: consume oral supplements with all meals while appetite is poor  Education needs have been addressed Skin:  Skin Assessment: Reviewed RN Assessment  Last BM:  12/18  Height:   Ht Readings from Last 1 Encounters:  03/31/18 6\' 4"  (1.93 m)  Weight:   Wt Readings from Last 1 Encounters:  03/31/18 70 kg    Ideal Body Weight:  92 kg  BMI:  Body mass index is 18.79 kg/m.  Estimated Nutritional Needs:   Kcal:  2100-2450 (30-35 kcal/kg/bw)  Protein:  49-56 gr (0.7-0.8 kg/bw)  Fluid:  1800 ml daily    Colman Cater MS,RD,CSG,LDN Office: [phone] Pager: [phone]

## 2018-04-01 NOTE — Progress Notes (Signed)
Pharmacy Antibiotic Note  Aaron [name] is a 62 y.o. male admitted on 03/31/2018 with osteomyelitis.  Pharmacy has been consulted for Vancomycin dosing.  Plan: Vancomycin 1500mg  IV x 1 Vancomycin 1gm IV every 48 hours.  Goal trough 15-20 mcg/mL. Rocephin per MD. Monitor labs, micro and vitals.   Height: 6\' 4"  (193 cm) Weight: 154 lb 6.4 oz (70 kg) IBW/kg (Calculated) : 86.8  Temp (24hrs), Avg:98.2 F (36.8 C), Min:97.7 F (36.5 C), Max:99 F (37.2 C)  Recent Labs  Lab 03/31/18 1825 04/01/18 0507  WBC 9.3 8.4  CREATININE 5.48* 5.43*    Estimated Creatinine Clearance: 14.1 mL/min (A) (by C-G formula based on SCr of 5.43 mg/dL (H)).    No Known Allergies  Antimicrobials this admission: Vancomcyin 12/19 >>  Rocephin 12/19 >>   Dose adjustments this admission: Estimated Creatinine Clearance: 14.1 mL/min (A) (by C-G formula based on SCr of 5.43 mg/dL (H)).   Microbiology results: 12/19 BCx: pending 12/18 UCx: pending   Sputum:    MRSA PCR:   Thank you for allowing pharmacy to be a part of this patient's care.  Pricilla Larsson 04/01/2018 6:57 PM

## 2018-04-01 NOTE — Plan of Care (Signed)
  Problem: Acute Rehab PT Goals(only PT should resolve) Goal: Pt Will Go Supine/Side To Sit Outcome: Progressing Flowsheets (Taken 04/01/2018 1234) Pt will go Supine/Side to Sit: Independently Goal: Patient Will Transfer Sit To/From Stand Outcome: Progressing Flowsheets (Taken 04/01/2018 1234) Patient will transfer sit to/from stand: with supervision Goal: Pt Will Transfer Bed To Chair/Chair To Bed Outcome: Progressing Flowsheets (Taken 04/01/2018 1234) Pt will Transfer Bed to Chair/Chair to Bed: with supervision Goal: Pt Will Ambulate Outcome: Progressing Flowsheets (Taken 04/01/2018 1234) Pt will Ambulate: 50 feet; with supervision; with cane   12:35 PM, 04/01/18 Lonell Grandchild, MPT Physical Therapist with [facility] [phone] office [phone] mobile phone

## 2018-04-01 NOTE — Evaluation (Signed)
Physical Therapy Evaluation Patient Details Name: Aaron [name] MRN: [id number] DOB: May 26, 1955 Today's Date: 04/01/2018   History of Present Illness  Aaron [name] is a 62 y.o. male with medical history significant of coronary artery disease, end-stage renal disease previously on dialysis not currently, COPD, chronic back pain, recent discitis completed treatment with oral antibiotics last week, longstanding noncompliance comes in with worsening lower back pain.  Patient is been fighting back pain for months especially with his recent discitis.  He denies any fevers at home.  He denies any urinary symptoms.  He denies any urinary or bowel incontinence.  He denies any saddle anesthesia.  Patient has had 3-4 falls in the last week he has been having weakness since his recent bout of discitis.  He was offered inpatient rehab but declined this for some reason.  Patient a CAT scan of his back today which shows questionable discitis unclear if this is an active issue.  Patient is being referred for admission for MRI in the morning to fully evaluate.    Clinical Impression  Patient demonstrates slow labored movement for sitting up at bedside and sit to stands with single point cane [facility]), limited to ambulation in room due to increasing back pain and overall functioning well below baseline.  Patient declined to transfer to chair due to back discomfort.  Patient will benefit from continued physical therapy in hospital and recommended venue below to increase strength, balance, endurance for safe ADLs and gait.    Follow Up Recommendations SNF    Equipment Recommendations       Recommendations for Other Services       Precautions / Restrictions Precautions Precautions: Fall Restrictions Weight Bearing Restrictions: No      Mobility  Bed Mobility Overal bed mobility: Modified Independent             General bed mobility comments: increased time  Transfers Overall transfer level: Needs  assistance Equipment used: Straight cane Transfers: Sit to/from Stand;Stand Pivot Transfers Sit to Stand: Min guard Stand pivot transfers: Min guard       General transfer comment: slow labored movement  Ambulation/Gait Ambulation/Gait assistance: Min guard Gait Distance (Feet): 20 Feet Assistive device: Straight cane Gait Pattern/deviations: Decreased step length - right;Decreased step length - left;Decreased stance time - left;Step-to pattern Gait velocity: decreased   General Gait Details: slow labored cadence without loss of balance with mostly 2 point gait pattern using SPC, limited mostly due to c/o low back pain and fatigue  Stairs            Wheelchair Mobility    Modified Rankin (Stroke Patients Only)       Balance Overall balance assessment: Needs assistance Sitting-balance support: Feet supported;No upper extremity supported Sitting balance-Leahy Scale: Good     Standing balance support: Single extremity supported;During functional activity Standing balance-Leahy Scale: Fair Standing balance comment: using SPC                             Pertinent Vitals/Pain Pain Assessment: 0-10 Pain Score: 7  Pain Location: low back Pain Descriptors / Indicators: Aching;Sore Pain Intervention(s): Limited activity within patient's tolerance;Monitored during session    [place] expects to be discharged to:: Private residence Living Arrangements: Alone Available Help at Discharge: Available PRN/intermittently;Family Type of Home: Apartment Home Access: Level entry     Home Layout: One level Home Equipment: Cane - single point;Grab bars - toilet;Grab bars -  tub/shower;Shower seat;Walker - Psychologist, educational - 2 wheels;Walker - 4 wheels;Bedside commode      Prior Function Level of Independence: Independent with assistive device(s)         Comments: community ambulator with SPC PRN, drives     Hand Dominance   Dominant Hand:  Right    Extremity/Trunk Assessment   Upper Extremity Assessment Upper Extremity Assessment: Generalized weakness    Lower Extremity Assessment Lower Extremity Assessment: Generalized weakness    Cervical / Trunk Assessment Cervical / Trunk Assessment: Kyphotic  Communication   Communication: No difficulties  Cognition Arousal/Alertness: Awake/alert Behavior During Therapy: WFL for tasks assessed/performed Overall Cognitive Status: Within Functional Limits for tasks assessed                                        General Comments      Exercises     Assessment/Plan    PT Assessment Patient needs continued PT services  PT Problem List Decreased strength;Decreased activity tolerance;Decreased balance;Decreased mobility       PT Treatment Interventions Gait training;Stair training;Functional mobility training;Therapeutic activities;Patient/family education;Therapeutic exercise    PT Goals (Current goals can be found in the Care Plan section)  Acute Rehab PT Goals Patient Stated Goal: return home after rehab PT Goal Formulation: With patient Time For Goal Achievement: 04/15/18 Potential to Achieve Goals: Good    Frequency Min 3X/week   Barriers to discharge        Co-evaluation               AM-PAC PT "6 Clicks" Mobility  Outcome Measure Help needed turning from your back to your side while in a flat bed without using bedrails?: None Help needed moving from lying on your back to sitting on the side of a flat bed without using bedrails?: None Help needed moving to and from a bed to a chair (including a wheelchair)?: None Help needed standing up from a chair using your arms (e.g., wheelchair or bedside chair)?: A Little Help needed to walk in hospital room?: A Little Help needed climbing 3-5 steps with a railing? : A Lot 6 Click Score: 20    End of Session   Activity Tolerance: Patient tolerated treatment well;Patient limited by  fatigue;Patient limited by pain Patient left: in bed;with call bell/phone within reach Nurse Communication: Mobility status PT Visit Diagnosis: Unsteadiness on feet (R26.81);Other abnormalities of gait and mobility (R26.89);Muscle weakness (generalized) (M62.81)    Time: 5465-0354 PT Time Calculation (min) (ACUTE ONLY): 21 min   Charges:   PT Evaluation $PT Eval Moderate Complexity: 1 Mod PT Treatments $Therapeutic Activity: 23-37 mins        12:32 PM, 04/01/18 Lonell Grandchild, MPT Physical Therapist with [facility] [phone] office [phone] mobile phone

## 2018-04-01 NOTE — Progress Notes (Addendum)
PROGRESS NOTE  Aaron [name] MHW:[id number] DOB: Jun 05, 1955 DOA: 03/31/2018 PCP: Gwenlyn Saran [place]  Brief History:  62 year old with a history of anxiety, osteoarthritis, atrial fibrillation, CAD, chronic back pain,lumbar discitis and paraspinal abscessCOPD, ESRDpreviouslyon hemodialysis, GERD, hepatitis C, essential hypertension, PAD, medical noncompliance came to the ERsecondary to increasing low back pain and intermittent hematuria.  The patient patient was most recently admitted from 01/31/2018 through 02/08/2018.  During that hospitalization, the patient had respiratory failure secondary to overuse of opioids and hypnotic medications.  The patient was supposed to discharge to [facility] to receive intravenous antibiotics for his lumbar discitis and paraspinal abscess.  However, at the last minute the patient changed his mind and refused IV antibiotics and SNF placement.  The patient was sent home with amox/clav and cipro for 5 additional weeks.  He claims compliance to the antibiotics.  He stated that he has had worsening back pain in the past week since he finished antibiotics.  He has had some radicular pain down his left leg.  Because of his back pain, he has had difficulty ambulating resulting in 3 mechanical falls in the past 2 days.  He denies any syncope.  He denies any fevers, chills, nausea, vomiting, diarrhea.  He states he has had intermittent hematuria for the past week.  He had followed up with his outpatient nephrologist who told him to present to the emergency department for further evaluation.  Regarding his recent medical history, the patient was recently admitted to the hospital from 01/26/2018 through 01/29/2018 during which the patient refused dialysis and was subsequently taken off of dialysis by nephrology. His dialysis catheter was removed at that time. The patient has had a long history of noncompliance including leaving AMA from  the hospitalization 01/09/2018 through 01/13/2018.   Assessment/Plan: Lumbar discitis and vertebral osteomyelitis -Suggested on CT abdomen and pelvis -MRI lumbar spine -Given the patient's long history of poor compliance, I question whether he truly took his oral antibiotics -Blood cultures x2 sets--unfortunately, this will likely be compromised as the patient received a dose of ceftriaxone 03/31/2018 at 2137 -Check ESR -Check CRP -12/04/2017 blood cultures--Serratia -12/06/17 left upper extremity wound culture--E. faecalis  Acute on chronic renal failure--CKD stage V -Baseline creatinine 4.2-4.6 -Consult nephrology -Concerned that he will need to be initiated on hemodialysis -The patient had previously refused hemodialysis.  He was poorly compliant not going to his outpatient dialysis center. -Left arm AV fistula ligated secondary to bleeding 12/2017  Chronic atrial fibrillationwith RVR -Continuediltiazem360 mg daily -Continue metoprolol to 100 mg bid -01/12/2018 TEE--EF 50-55%, no vegetation, mild MR, HK anterior septal -Holding warfarin secondary to hematuria  Essential hypertension -Continue metoprolol tartrate and diltiazem  Hematuria -May in part be due to the patient's hemorrhagic renal cysts -monitor Hgb -urology consulted--spoke with Dr. Karsten Ro  COPD -Stable on room air  Tobacco abuse -I have discussed tobacco cessation with the patient.  I have counseled the patient regarding the negative impacts of continued tobacco use including but not limited to lung cancer, COPD, and cardiovascular disease.  I have discussed alternatives to tobacco and modalities that may help facilitate tobacco cessation including but not limited to biofeedback, hypnosis, and medications.  Total time spent with tobacco counseling was 4 minutes.     Disposition Plan:   Not stable for d/c Family Communication:   Daughter updated on phone 12/19  Consultants:  Renal, urology  Code Status:   FULL  DVT Prophylaxis:  SCDs   Procedures: As Listed in Progress Note Above  Antibiotics: Ceftriaxone 12/18     Subjective: Patient continues to complain of low back pain and hematuria.  He denies any fevers, chills, chest pain, shortness breath, nausea, vomiting, diarrhea, abdominal pain.  There is no dysuria hematuria but there is no rashes.  Objective: Vitals:   03/31/18 2234 03/31/18 2235 03/31/18 2236 04/01/18 0627  BP:  (!) 149/106 (!) 144/99 (!) 142/107  Pulse:  90 91 88  Resp:  16  16  Temp:  99 F (37.2 C)  97.9 F (36.6 C)  TempSrc:    Oral  SpO2:  99% 99% 98%  Weight: 70 kg     Height: '6\' 4"'  (1.93 m)       Intake/Output Summary (Last 24 hours) at 04/01/2018 0754 Last data filed at 04/01/2018 0636 Gross per 24 hour  Intake 100 ml  Output 200 ml  Net -100 ml   Weight change:  Exam:   General:  Pt is alert, follows commands appropriately, not in acute distress  HEENT: No icterus, No thrush, No neck mass, [place]/AT  Cardiovascular: IRRR, S1/S2, no rubs, no gallops  Respiratory: Diminished breath sounds at the bases.  No wheezing.  Good air movement  Abdomen: Soft/+BS, non tender, non distended, no guarding  Extremities: No edema, No lymphangitis, No petechiae, No rashes, no synovitis   Data Reviewed: I have personally reviewed following labs and imaging studies Basic Metabolic Panel: Recent Labs  Lab 03/31/18 1825 04/01/18 0507  NA 136 135  K 4.0 4.0  CL 106 107  CO2 19* 16*  GLUCOSE 96 81  BUN 53* 53*  CREATININE 5.48* 5.43*  CALCIUM 8.5* 8.3*   Liver Function Tests: Recent Labs  Lab 03/31/18 1825  AST 10*  ALT 10  ALKPHOS 106  BILITOT 0.7  PROT 7.0  ALBUMIN 3.5   No results for input(s): LIPASE, AMYLASE in the last 168 hours. No results for input(s): AMMONIA in the last 168 hours. Coagulation Profile: No results for input(s): INR, PROTIME in the last 168 hours. CBC: Recent Labs  Lab 03/31/18 1825 04/01/18 0507  WBC 9.3  8.4  NEUTROABS 7.4  --   HGB 12.1* 11.1*  HCT 38.1* 34.4*  MCV 97.7 98.3  PLT 259 244   Cardiac Enzymes: No results for input(s): CKTOTAL, CKMB, CKMBINDEX, TROPONINI in the last 168 hours. BNP: Invalid input(s): POCBNP CBG: No results for input(s): GLUCAP in the last 168 hours. HbA1C: No results for input(s): HGBA1C in the last 72 hours. Urine analysis:    Component Value Date/Time   COLORURINE AMBER (A) 03/31/2018 1801   APPEARANCEUR CLOUDY (A) 03/31/2018 1801   LABSPEC 1.011 03/31/2018 1801   PHURINE 6.0 03/31/2018 1801   GLUCOSEU NEGATIVE 03/31/2018 1801   HGBUR LARGE (A) 03/31/2018 1801   BILIRUBINUR NEGATIVE 03/31/2018 1801   KETONESUR NEGATIVE 03/31/2018 1801   PROTEINUR 100 (A) 03/31/2018 1801   UROBILINOGEN 0.2 03/03/2010 1056   NITRITE NEGATIVE 03/31/2018 1801   LEUKOCYTESUR LARGE (A) 03/31/2018 1801   Sepsis Labs: '@LABRCNTIP' (procalcitonin:4,lacticidven:4) ) Recent Results (from the past 240 hour(s))  Culture, blood (Routine X 2) w Reflex to ID Panel     Status: None (Preliminary result)   Collection Time: 04/01/18  7:20 AM  Result Value Ref Range Status   Specimen Description BLOOD RIGHT HAND  Final   Special Requests   Final    BOTTLES DRAWN AEROBIC AND ANAEROBIC Blood Culture adequate volume Performed at  [facility], [address], [place], [place] [address]    Culture PENDING  Incomplete   Report Status PENDING  Incomplete  Culture, blood (Routine X 2) w Reflex to ID Panel     Status: None (Preliminary result)   Collection Time: 04/01/18  7:39 AM  Result Value Ref Range Status   Specimen Description BLOOD RIGHT ARM UPPER  Final   Special Requests   Final    BOTTLES DRAWN AEROBIC AND ANAEROBIC Blood Culture adequate volume Performed at [facility], [address] [place], [place] [address]    Culture PENDING  Incomplete   Report Status PENDING  Incomplete     Scheduled Meds: . diltiazem  360 mg Oral Daily  . feeding supplement (ENSURE ENLIVE)   237 mL Oral BID BM  . furosemide  40 mg Oral Daily  . hydrALAZINE  50 mg Oral Q8H  . Influenza vac split quadrivalent PF  0.5 mL Intramuscular Tomorrow-1000  . metoprolol tartrate  100 mg Oral BID  . pantoprazole  40 mg Oral BID  . sodium chloride flush  3 mL Intravenous Q12H   Continuous Infusions: . sodium chloride      Procedures/Studies: Ct Renal Stone Study  Result Date: 03/31/2018 CLINICAL DATA:  Flank pain for 2 months. Hematuria for several days. Suspect stone disease. EXAM: CT ABDOMEN AND PELVIS WITHOUT CONTRAST TECHNIQUE: Multidetector CT imaging of the abdomen and pelvis was performed following the standard protocol without IV contrast. COMPARISON:  10/13/2017 FINDINGS: Lower chest: No acute findings. Hepatobiliary: No mass visualized on this unenhanced exam. Several small fluid attenuation hepatic cysts again noted. Gallbladder is unremarkable. Pancreas: No mass or inflammatory process visualized on this unenhanced exam. Spleen:  Within normal limits in size. Adrenals/Urinary tract: Normal adrenal glands. Marked symmetric enlargement of both kidneys is seen with diffuse involvement by innumerable water attenuation and hemorrhagic renal cysts, which are not significantly changed compared to prior study. These findings are consistent with autosomal dominant polycystic kidney disease. Several small less than 1 cm left renal calculi are seen. No evidence of ureteral calculi or hydronephrosis. Unremarkable unopacified urinary bladder. Stomach/Bowel: No evidence of obstruction, inflammatory process, or abnormal fluid collections. Diverticulosis is seen mainly involving the descending and sigmoid colon, however there is no evidence of diverticulitis. Vascular/Lymphatic: No pathologically enlarged lymph nodes identified. No evidence of abdominal aortic aneurysm. Aortic atherosclerosis. A stent again seen in the right external iliac artery. Reproductive:  No mass or other significant abnormality.  Other:  None. Musculoskeletal: Previous interbody fusions again seen at L2-3 and L3-4. New destruction of intervertebral disc spaces and adjacent vertebral body endplates are seen at V6-9 and L4-5 since prior study of 10/13/2017; infectious discitis can not be excluded. IMPRESSION: Autosomal dominant polycystic kidney disease, without significant change since prior study. Several small left renal calculi. No evidence of ureteral calculi or hydronephrosis. New destruction of intervertebral disc spaces and adjacent vertebral endplates at I5-0 and T8-8, suspicious for infectious discitis. Recommend clinical correlation and consider lumbar spine MRI without and with contrast for further evaluation. Electronically Signed   By: Earle Gell M.D.   On: 03/31/2018 20:38    Orson Eva, DO  [facility] Pager [phone]  If 7PM-7AM, please contact night-coverage [email or web address] Password [email or web address] 04/01/2018, 7:54 AM   LOS: 1 day

## 2018-04-01 NOTE — NC FL2 (Addendum)
[place] LEVEL OF CARE SCREENING TOOL     IDENTIFICATION  Patient Name: Aaron [name] Birthdate: [birth year] Sex: male Admission Date (Current Location): 03/31/2018  [facility] and [place] Number:  [employer] and Address:  [place] [address], [place]      Provider Number: [id number]  Attending Physician Name and Address:  Orson Eva, MD  Relative Name and Phone Number:       Current Level of Care: Hospital Recommended Level of Care: [place] Prior Approval Number:    Date Approved/Denied:   PASRR Number:    Discharge Plan: SNF    Current Diagnoses: Patient Active Problem List   Diagnosis Date Noted  . Back pain 03/31/2018  . Noncompliance with medication regimen 03/31/2018  . CKD (chronic kidney disease) stage 5, GFR less than 15 ml/min (HCC) 02/03/2018  . COPD with acute exacerbation ([place]) 02/03/2018  . Atrial fibrillation, chronic 02/03/2018  . Acute encephalopathy   . Acute respiratory failure ([place]) 01/31/2018  . Fever   . Sepsis due to undetermined organism ([place]) 01/27/2018  . Discitis 01/27/2018  . Discitis of lumbar region 01/09/2018  . Paraspinal abscess ([place]) 01/08/2018  . ESRD on dialysis ([place]) 12/30/2017  . Bacteremia 12/07/2017  . Hematoma of arm, right, initial encounter 11/23/2017  . Protein-calorie malnutrition, severe 10/15/2017  . Hyperlipidemia 08/24/2017  . Schatzki's ring of distal esophagus 04/27/2017  . Encounter for therapeutic drug monitoring 03/03/2017  . History of hepatitis C 02/20/2017  . Ischemic cardiomyopathy 01/27/2017  . Dyslipidemia 01/27/2017  . AF (paroxysmal atrial fibrillation) ([place]) 01/27/2017  . CAD (coronary artery disease) 01/21/2017  . Hepatitis C 12/15/2016  . Polycystic kidney disease 12/14/2016  . COPD (chronic obstructive pulmonary disease) ([place])   . COLD (chronic obstructive lung disease) ([place])   . Depression   . PAD (peripheral artery  disease) ([place]) 09/19/2014  . Preop cardiovascular exam 10/28/2010  . Tobacco abuse 10/28/2010  . UNSPECIFIED IRON DEFICIENCY ANEMIA 10/04/2009  . 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  . Essential hypertension 10/04/2009  . Esophageal reflux 10/04/2009  . PRECORDIAL PAIN 10/04/2009    Orientation RESPIRATION BLADDER Height & Weight     Self, Time, Situation, Place  Normal Continent Weight: 154 lb 6.4 oz (70 kg) Height:  6\' 4"  (193 cm)  BEHAVIORAL SYMPTOMS/MOOD NEUROLOGICAL BOWEL NUTRITION STATUS      Continent Diet(heart healthy/carb modified)  AMBULATORY STATUS COMMUNICATION OF NEEDS Skin   Limited Assist Verbally Normal                       Personal Care Assistance Level of Assistance  Bathing, Feeding, Dressing Bathing Assistance: Limited assistance Feeding assistance: Independent Dressing Assistance: Limited assistance     Functional Limitations Info  Sight, Hearing, Speech Sight Info: Adequate Hearing Info: Adequate Speech Info: Adequate    SPECIAL CARE FACTORS FREQUENCY  PT (By licensed PT)     PT Frequency: 5x/week               Contractures Contractures Info: Not present    Additional Factors Info  Code Status, Allergies, Psychotropic Code Status Info: Full Code Allergies Info: NKA Psychotropic Info: Desyrel         Current Medications (04/01/2018):  This is the current hospital active medication list Current Facility-Administered Medications  Medication Dose Route Frequency Provider Last Rate Last Dose  . 0.9 %  sodium chloride infusion  250 mL Intravenous PRN Phillips Grout,  MD      . albuterol (PROVENTIL) (2.5 MG/3ML) 0.083% nebulizer solution 3 mL  3 mL Inhalation Q6H PRN Phillips Grout, MD      . diltiazem (CARDIZEM CD) 24 hr capsule 360 mg  360 mg Oral Daily Derrill Kay A, MD   360 mg at 04/01/18 0915  . feeding supplement (ENSURE ENLIVE) (ENSURE ENLIVE) liquid 237 mL  237 mL Oral TID BM Tat, David, MD      . furosemide (LASIX) tablet 40  mg  40 mg Oral Daily Derrill Kay A, MD   40 mg at 04/01/18 0916  . hydrALAZINE (APRESOLINE) tablet 50 mg  50 mg Oral Q8H Derrill Kay A, MD   50 mg at 04/01/18 0600  . metoprolol tartrate (LOPRESSOR) tablet 100 mg  100 mg Oral BID Derrill Kay A, MD   100 mg at 04/01/18 0916  . oxyCODONE (Oxy IR/ROXICODONE) immediate release tablet 5 mg  5 mg Oral Q4H PRN Phillips Grout, MD   5 mg at 04/01/18 1139  . pantoprazole (PROTONIX) EC tablet 40 mg  40 mg Oral BID Phillips Grout, MD   40 mg at 04/01/18 0915  . sodium bicarbonate tablet 650 mg  650 mg Oral BID Elmarie Shiley, MD   650 mg at 04/01/18 1143  . sodium chloride 0.45 % 1,000 mL with sodium bicarbonate 75 mEq infusion   Intravenous Continuous Elmarie Shiley, MD 100 mL/hr at 04/01/18 1146    . sodium chloride flush (NS) 0.9 % injection 3 mL  3 mL Intravenous Q12H Derrill Kay A, MD   3 mL at 04/01/18 0918  . sodium chloride flush (NS) 0.9 % injection 3 mL  3 mL Intravenous PRN Phillips Grout, MD         Discharge Medications: Please see discharge summary for a list of discharge medications.  Relevant Imaging Results:  Relevant Lab Results:   Additional Information SSN: [id number]  IV ABX PICC line placed: cefTRIAXone 2 g in sodium chloride 0.9 % 100 mL Inject 2 g into the vein daily. Last dose 05/26/2018  Settle, Clydene Pugh, LCSW

## 2018-04-02 DIAGNOSIS — M4620 Osteomyelitis of vertebra, site unspecified: Secondary | ICD-10-CM

## 2018-04-02 DIAGNOSIS — E43 Unspecified severe protein-calorie malnutrition: Secondary | ICD-10-CM

## 2018-04-02 DIAGNOSIS — R310 Gross hematuria: Secondary | ICD-10-CM

## 2018-04-02 LAB — BASIC METABOLIC PANEL
Anion gap: 10 (ref 5–15)
BUN: 53 mg/dL — ABNORMAL HIGH (ref 8–23)
CO2: 21 mmol/L — ABNORMAL LOW (ref 22–32)
Calcium: 8.1 mg/dL — ABNORMAL LOW (ref 8.9–10.3)
Chloride: 106 mmol/L (ref 98–111)
Creatinine, Ser: 4.8 mg/dL — ABNORMAL HIGH (ref 0.61–1.24)
GFR calc Af Amer: 14 mL/min — ABNORMAL LOW (ref >60)
GFR calc non Af Amer: 12 mL/min — ABNORMAL LOW (ref >60)
Glucose, Bld: 101 mg/dL — ABNORMAL HIGH (ref 70–99)
Potassium: 3.7 mmol/L (ref 3.5–5.1)
Sodium: 137 mmol/L (ref 135–145)

## 2018-04-02 MED ORDER — SODIUM CHLORIDE 0.9 % IV SOLN
2.0000 g | INTRAVENOUS | Status: DC
  Administered 2018-04-03 – 2018-04-04 (×2): 2 g via INTRAVENOUS
  Filled 2018-04-02 (×2): qty 2
  Filled 2018-04-02: qty 20

## 2018-04-02 NOTE — Progress Notes (Signed)
Patient ID: Aaron [name], male   DOB: February 03, 1956, 62 y.o.   MRN: [id number] [facility] Progress Note   Assessment/ Plan:   1.  Acute kidney injury on chronic kidney disease stage IV-V:  Remains nonoliguric with some improvement of his creatinine noted from labs earlier today.  He does not have any acute electrolyte abnormalities and metabolic acidosis is improving with ongoing oral bicarbonate supplementation/status post IV sodium bicarbonate.  He does not have indications for initiation of dialysis at this time. 2.  Acute on chronic back pain: MRI of the spine showed L1-L2 and L4-L5 discitis with osteomyelitis compatible with ongoing inflammation/infection. Blood cultures negative to date.  He remains on vancomycin and ceftriaxone with plans for intravenous antibiotic at a skilled nursing facility upon discharge. 3.  Hypertension: Restarted on usual antihypertensive therapy with intermittently elevated blood pressures, monitor at this time. 4.  Anemia: With anemia of chronic kidney disease exacerbated by recent hematuria, remains hemodynamically stable. 5.  Chronic atrial fibrillation: He is currently rate controlled and otherwise hemodynamically stable.  Warfarin on hold secondary to hematuria. 6.  Hematuria:  Seen by urology and plans noted for outpatient evaluation with cystoscopy; urine culture suggestive of infection with >100,000 GNRs for which he is currently on ceftriaxone.  Please call with questions or concerns over the weekend; we will resume seeing him on Monday unless acutely needed.  Subjective:   Reports to be feeling better with improvement of hematuria-urine now appears tea colored.  Denies any chest pain, shortness of breath or abnormal jerking movement.   Objective:   BP (!) 145/101   Pulse 69   Temp 98.2 F (36.8 C) (Oral)   Resp 18   Ht 6\' 4"  (1.93 m)   Wt 70 kg   SpO2 100%   BMI 18.79 kg/m   Intake/Output Summary (Last 24 hours) at 04/02/2018  0831 Last data filed at 04/02/2018 1601 Gross per 24 hour  Intake 1720.93 ml  Output 850 ml  Net 870.93 ml   Weight change:   Physical Exam: Gen: Comfortably sitting up in bed, watching television CVS: Pulse irregularly irregular, S1 and S2 normal Resp: Clear to auscultation bilaterally, no rales or rhonchi Abd: Soft, flat, nontender Ext: No lower extremity edema  Imaging: Mr Lumbar Spine Wo Contrast  Result Date: 04/01/2018 CLINICAL DATA:  Low back pain radiating down both legs for 2 months. No discitis osteomyelitis with antibiotics completed last week EXAM: MRI LUMBAR SPINE WITHOUT CONTRAST TECHNIQUE: Multiplanar, multisequence MR imaging of the lumbar spine was performed. No intravenous contrast was administered. COMPARISON:  Abdominal CT from yesterday.  Lumbar MRI 01/06/2018 FINDINGS: Segmentation:  5 lumbar type vertebral bodies Alignment:  Reversal of lumbar lordosis.  L5-S1 retrolisthesis Vertebrae: T2 hyperintensity within the disc spaces of L1-2 and L4-5, with marrow edema and endplate destruction that has significantly progressed at L1-2-leading to kyphotic deformity and endplate ridging. Less extensive progression at L4-5. There is still paravertebral inflammation without evident collection. Periarticular edema about the L1-2 and L4-5 facets which may be related to altered weight-bearing as there is no underlying facet joint destruction to imply facet arthritis. Conus medullaris and cauda equina: Conus extends to the L1 level. Conus and cauda equina appear normal. Paraspinal and other soft tissues: Paravertebral/psoas edema as noted above. 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 Disc levels: T12- L1: Unremarkable. L1-L2: Infectious disc collapse with endplate ridging. There is moderate spinal stenosis. Advanced right foraminal stenosis with L1 compression. L2-L3: Remote PLIF.  No  solid arthrodesis by CT.  No impingement L3-L4: Remote PLIF.  No solid arthrodesis by CT.   No impingement L4-L5: Discitis osteomyelitis. Degenerative facet spurring and degenerative ridging. No impingement L5-S1:Disc narrowing and bulging with retrolisthesis. Degenerative facet spurring. Moderate right foraminal stenosis. IMPRESSION: 1. 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 Degree of residual marrow and paravertebral edema is compatible with ongoing inflammation/infection. There is advanced right foraminal impingement at L1-2 from disc collapse. No evident collection. 2. Chronic findings described above. Electronically Signed   By: Monte Fantasia M.D.   On: 04/01/2018 09:03   Ct Renal Stone Study  Result Date: 03/31/2018 CLINICAL DATA:  Flank pain for 2 months. Hematuria for several days. Suspect stone disease. EXAM: CT ABDOMEN AND PELVIS WITHOUT CONTRAST TECHNIQUE: Multidetector CT imaging of the abdomen and pelvis was performed following the standard protocol without IV contrast. COMPARISON:  10/13/2017 FINDINGS: Lower chest: No acute findings. Hepatobiliary: No mass visualized on this unenhanced exam. Several small fluid attenuation hepatic cysts again noted. Gallbladder is unremarkable. Pancreas: No mass or inflammatory process visualized on this unenhanced exam. Spleen:  Within normal limits in size. Adrenals/Urinary tract: Normal adrenal glands. Marked symmetric enlargement of both kidneys is seen with diffuse involvement by innumerable water attenuation and hemorrhagic renal cysts, which are not significantly changed compared to prior study. These findings are consistent with autosomal dominant polycystic kidney disease. Several small less than 1 cm left renal calculi are seen. No evidence of ureteral calculi or hydronephrosis. Unremarkable unopacified urinary bladder. Stomach/Bowel: No evidence of obstruction, inflammatory process, or abnormal fluid collections. Diverticulosis is seen mainly involving the  descending and sigmoid colon, however there is no evidence of diverticulitis. Vascular/Lymphatic: No pathologically enlarged lymph nodes identified. No evidence of abdominal aortic aneurysm. Aortic atherosclerosis. A stent again seen in the right external iliac artery. Reproductive:  No mass or other significant abnormality. Other:  None. Musculoskeletal: Previous interbody fusions again seen at L2-3 and L3-4. New destruction of intervertebral disc spaces and adjacent vertebral body endplates are seen at W2-5 and L4-5 since prior study of 10/13/2017; infectious discitis can not be excluded. IMPRESSION: Autosomal dominant polycystic kidney disease, without significant change since prior study. Several small left renal calculi. No evidence of ureteral calculi or hydronephrosis. New destruction of intervertebral disc spaces and adjacent vertebral endplates at E5-2 and D7-8, suspicious for infectious discitis. Recommend clinical correlation and consider lumbar spine MRI without and with contrast for further evaluation. Electronically Signed   By: Earle Gell M.D.   On: 03/31/2018 20:38    Labs: BMET Recent Labs  Lab 03/31/18 1825 04/01/18 0507 04/02/18 0522  NA 136 135 137  K 4.0 4.0 3.7  CL 106 107 106  CO2 19* 16* 21*  GLUCOSE 96 81 101*  BUN 53* 53* 53*  CREATININE 5.48* 5.43* 4.80*  CALCIUM 8.5* 8.3* 8.1*   CBC Recent Labs  Lab 03/31/18 1825 04/01/18 0507  WBC 9.3 8.4  NEUTROABS 7.4  --   HGB 12.1* 11.1*  HCT 38.1* 34.4*  MCV 97.7 98.3  PLT 259 244   Medications:    . diltiazem  360 mg Oral Daily  . feeding supplement (ENSURE ENLIVE)  237 mL Oral TID BM  . furosemide  40 mg Oral Daily  . hydrALAZINE  50 mg Oral Q8H  . metoprolol tartrate  100 mg Oral BID  . pantoprazole  40 mg Oral BID  . sodium bicarbonate  650 mg  Oral BID  . sodium chloride flush  3 mL Intravenous Q12H   Elmarie Shiley, MD 04/02/2018, 8:31 AM

## 2018-04-02 NOTE — Progress Notes (Addendum)
PROGRESS NOTE  Aaron [name] HWT:[id number] DOB: 1955/06/08 DOA: 03/31/2018 PCP: Gwenlyn Saran [place]  Brief History:  62 year old with a history of anxiety, osteoarthritis, atrial fibrillation, CAD, chronic back pain,lumbar discitis and paraspinal abscessCOPD, ESRDpreviouslyon hemodialysis, GERD, hepatitis C, essential hypertension, PAD, medical noncompliance came to the ERsecondary to increasing low back pain and intermittent hematuria.  The patient patient was most recently admitted from 01/31/2018 through 02/08/2018.  During that hospitalization, the patient had respiratory failure secondary to overuse of opioids and hypnotic medications.  The patient was supposed to discharge to [facility] to receive intravenous antibiotics for his lumbar discitis and paraspinal abscess.  However, at the last minute the patient changed his mind and refused IV antibiotics and SNF placement.  The patient was sent home with amox/clav and cipro for 5 additional weeks.  He claims compliance to the antibiotics.  He stated that he has had worsening back pain in the past week since he finished antibiotics.  He has had some radicular pain down his left leg.  Because of his back pain, he has had difficulty ambulating resulting in 3 mechanical falls in the past 2 days.  He denies any syncope.  He denies any fevers, chills, nausea, vomiting, diarrhea.  He states he has had intermittent hematuria for the past week.  He had followed up with his outpatient nephrologist who told him to present to the emergency department for further evaluation.  Regarding his recent medical history, the patient was recently admitted to the hospital from 01/26/2018 through 01/29/2018 during which the patient refused dialysis and was subsequently taken off of dialysis by nephrology. His dialysis catheter was removed at that time. The patient has had a long history of noncompliance including leaving AMA from  the hospitalization 01/09/2018 through 01/13/2018.   Assessment/Plan: Lumbar discitis and vertebral osteomyelitis -Suggested on CT abdomen and pelvis -04/01/2018 MRI lumbar spine--L1-2 and L4-5 discitis and osteomyelitis with progressive disc and vertebral collapse.  Advanced right foraminal impingement L1-2 -Given the patient's long history of poor compliance, I question whether he truly took his oral antibiotics -Blood cultures x2 sets--unfortunately, this will likely be compromised as the patient received a dose of ceftriaxone 03/31/2018 at 2137 -Check ESR--65 -Check CRP--5.6 -12/04/2017 blood cultures--[place] -12/06/17 left upper extremity wound culture--E. faecalis -Case was discussed with neurosurgery, Dr. Zada Finders continue nonoperative management; no acute indication for surgery -Place PICC line if blood cultures remain negative -Start vancomycin and ceftriaxone IV  Acute on chronic renal failure--CKD stage V -Baseline creatinine 4.2-4.6 -Consult nephrology--appreciated -Continue bicarbonate -Concerned that he will need to be initiated on hemodialysis -The patient had previously refused hemodialysis.  He was poorly compliant not going to his outpatient dialysis center. -Left arm AV fistula ligated secondary to bleeding 12/2017  Chronic atrial fibrillationwith RVR -Continuediltiazem360 mg daily -Continue metoprolol to 100 mg bid -01/12/2018 TEE--EF 50-55%, no vegetation, mild MR, HK anterior septal -Holding warfarin secondary to hematuria--> patient states that he has not taken in any way  Essential hypertension -Continue metoprolol tartrate and diltiazem  Hematuria -May in part be due to the patient's hemorrhagic renal cysts rupturing as well as UTI -monitor Hgb -urology consult appreciated--> follow-up for outpatient cystoscopy  UTI--Klebsiella -UA > 50 WBC -Continue ceftriaxone pending culture data  COPD -Stable on room air  Tobacco abuse -I have  discussed tobacco cessation with the patient.  I have counseled the patient regarding the negative impacts of continued tobacco use including but  not limited to lung cancer, COPD, and cardiovascular disease.  I have discussed alternatives to tobacco and modalities that may help facilitate tobacco cessation including but not limited to biofeedback, hypnosis, and medications.  Total time spent with tobacco counseling was 4 minutes.  Severe malnutrition -Continue supplements   Disposition Plan:    Skilled nursing facility 2 to 3 days Family Communication:   Daughter updated on phone 12/19  Consultants:  Renal, urology  Code Status:  FULL   DVT Prophylaxis:  SCDs   Procedures: As Listed in Progress Note Above  Antibiotics: Ceftriaxone 12/19>>> vanco 12/19>>>>      Subjective: Patient denies fevers, chills, headache, chest pain, dyspnea, nausea, vomiting, diarrhea, abdominal pain, dysuria, hematuria, hematochezia, and melena. Patient states that his back pain is low but better.  He states that his hematuria is clearing up a little.  Objective: Vitals:   04/01/18 1426 04/01/18 2201 04/02/18 0603 04/02/18 1324  BP: (!) 155/100 (!) 151/102 (!) 145/101 (!) 156/106  Pulse: (!) 59 80 69 89  Resp: _0 Temp: 97.7 F (36.5 C) 98.4 F (36.9 C) 98.2 F (36.8 C) 97.8 F (36.6 C)  TempSrc: Oral Oral Oral Oral  SpO2: 99% 97% 100% 100%  Weight:      Height:        Intake/Output Summary (Last 24 hours) at 04/02/2018 1804 Last data filed at 04/02/2018 1454 Gross per 24 hour  Intake 1080 ml  Output 750 ml  Net 330 ml   Weight change:  Exam:   General:  Pt is alert, follows commands appropriately, not in acute distress  HEENT: No icterus, No thrush, No neck mass, [place]/AT  Cardiovascular: RRR, S1/S2, no rubs, no gallops  Respiratory: Diminished breath sounds at the bases.  No wheezing.  Good air movement.  Abdomen: Soft/+BS, non tender, non distended, no  guarding  Extremities: No edema, No lymphangitis, No petechiae, No rashes, no synovitis   Data Reviewed: I have personally reviewed following labs and imaging studies Basic Metabolic Panel: Recent Labs  Lab 03/31/18 1825 04/01/18 0507 04/02/18 0522  NA 136 135 137  K 4.0 4.0 3.7  CL 106 107 106  CO2 19* 16* 21*  GLUCOSE 96 81 101*  BUN 53* 53* 53*  CREATININE 5.48* 5.43* 4.80*  CALCIUM 8.5* 8.3* 8.1*   Liver Function Tests: Recent Labs  Lab 03/31/18 1825  AST 10*  ALT 10  ALKPHOS 106  BILITOT 0.7  PROT 7.0  ALBUMIN 3.5   No results for input(s): LIPASE, AMYLASE in the last 168 hours. No results for input(s): AMMONIA in the last 168 hours. Coagulation Profile: No results for input(s): INR, PROTIME in the last 168 hours. CBC: Recent Labs  Lab 03/31/18 1825 04/01/18 0507  WBC 9.3 8.4  NEUTROABS 7.4  --   HGB 12.1* 11.1*  HCT 38.1* 34.4*  MCV 97.7 98.3  PLT 259 244   Cardiac Enzymes: No results for input(s): CKTOTAL, CKMB, CKMBINDEX, TROPONINI in the last 168 hours. BNP: Invalid input(s): POCBNP CBG: No results for input(s): GLUCAP in the last 168 hours. HbA1C: No results for input(s): HGBA1C in the last 72 hours. Urine analysis:    Component Value Date/Time   COLORURINE AMBER (A) 03/31/2018 1801   APPEARANCEUR CLOUDY (A) 03/31/2018 1801   LABSPEC 1.011 03/31/2018 1801   PHURINE 6.0 03/31/2018 1801   GLUCOSEU NEGATIVE 03/31/2018 1801   HGBUR LARGE (A) 03/31/2018 1801   BILIRUBINUR NEGATIVE 03/31/2018 1801   KETONESUR NEGATIVE 03/31/2018  1801   PROTEINUR 100 (A) 03/31/2018 1801   UROBILINOGEN 0.2 03/03/2010 1056   NITRITE NEGATIVE 03/31/2018 1801   LEUKOCYTESUR LARGE (A) 03/31/2018 1801   Sepsis Labs: _0 (procalcitonin:4,lacticidven:4) ) Recent Results (from the past 240 hour(s))  Urine C&S     Status: Abnormal (Preliminary result)   Collection Time: 03/31/18  6:01 PM  Result Value Ref Range Status   Specimen Description   Final     URINE, CLEAN CATCH Performed at [facility], [address], [place], [place] [address]    Special Requests   Final    NONE Performed at [facility], [address] [place], [place] [address]    Culture >=100,000 COLONIES/mL KLEBSIELLA PNEUMONIAE (A)  Final   Report Status PENDING  Incomplete  Culture, blood (Routine X 2) w Reflex to ID Panel     Status: None (Preliminary result)   Collection Time: 04/01/18  7:20 AM  Result Value Ref Range Status   Specimen Description BLOOD RIGHT HAND  Final   Special Requests   Final    BOTTLES DRAWN AEROBIC AND ANAEROBIC Blood Culture adequate volume   Culture   Final    NO GROWTH 1 DAY Performed at [facility], [address] [place], [place] [address]    Report Status PENDING  Incomplete  Culture, blood (Routine X 2) w Reflex to ID Panel     Status: None (Preliminary result)   Collection Time: 04/01/18  7:39 AM  Result Value Ref Range Status   Specimen Description BLOOD RIGHT ARM UPPER  Final   Special Requests   Final    BOTTLES DRAWN AEROBIC AND ANAEROBIC Blood Culture adequate volume   Culture   Final    NO GROWTH < 24 HOURS Performed at [facility], [address] [place], [place] [address]    Report Status PENDING  Incomplete     Scheduled Meds: . diltiazem  360 mg Oral Daily  . feeding supplement (ENSURE ENLIVE)  237 mL Oral TID BM  . furosemide  40 mg Oral Daily  . hydrALAZINE  50 mg Oral Q8H  . metoprolol tartrate  100 mg Oral BID  . pantoprazole  40 mg Oral BID  . sodium bicarbonate  650 mg Oral BID  . sodium chloride flush  3 mL Intravenous Q12H   Continuous Infusions: . sodium chloride    . cefTRIAXone (ROCEPHIN)  IV 2 g (04/02/18 1110)  . [START ON 04/03/2018] vancomycin      Procedures/Studies: Mr Lumbar Spine Wo Contrast  Result Date: 04/01/2018 CLINICAL DATA:  Low back pain radiating down both legs for 2 months. No discitis osteomyelitis with antibiotics completed last week EXAM: MRI LUMBAR SPINE WITHOUT  CONTRAST TECHNIQUE: Multiplanar, multisequence MR imaging of the lumbar spine was performed. No intravenous contrast was administered. COMPARISON:  Abdominal CT from yesterday.  Lumbar MRI 01/06/2018 FINDINGS: Segmentation:  5 lumbar type vertebral bodies Alignment:  Reversal of lumbar lordosis.  L5-S1 retrolisthesis Vertebrae: T2 hyperintensity within the disc spaces of L1-2 and L4-5, with marrow edema and endplate destruction that has significantly progressed at L1-2-leading to kyphotic deformity and endplate ridging. Less extensive progression at L4-5. There is still paravertebral inflammation without evident collection. Periarticular edema about the L1-2 and L4-5 facets which may be related to altered weight-bearing as there is no underlying facet joint destruction to imply facet arthritis. Conus medullaris and cauda equina: Conus extends to the L1 level. Conus and cauda equina appear normal. Paraspinal and other soft tissues: Paravertebral/psoas edema  as noted above. 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 Disc levels: T12- L1: Unremarkable. L1-L2: Infectious disc collapse with endplate ridging. There is moderate spinal stenosis. Advanced right foraminal stenosis with L1 compression. L2-L3: Remote PLIF.  No solid arthrodesis by CT.  No impingement L3-L4: Remote PLIF.  No solid arthrodesis by CT.  No impingement L4-L5: Discitis osteomyelitis. Degenerative facet spurring and degenerative ridging. No impingement L5-S1:Disc narrowing and bulging with retrolisthesis. Degenerative facet spurring. Moderate right foraminal stenosis. IMPRESSION: 1. 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 Degree of residual marrow and paravertebral edema is compatible with ongoing inflammation/infection. There is advanced right foraminal impingement at L1-2 from disc collapse. No evident collection. 2. Chronic findings described above.  Electronically Signed   By: Monte Fantasia M.D.   On: 04/01/2018 09:03   Ct Renal Stone Study  Result Date: 03/31/2018 CLINICAL DATA:  Flank pain for 2 months. Hematuria for several days. Suspect stone disease. EXAM: CT ABDOMEN AND PELVIS WITHOUT CONTRAST TECHNIQUE: Multidetector CT imaging of the abdomen and pelvis was performed following the standard protocol without IV contrast. COMPARISON:  10/13/2017 FINDINGS: Lower chest: No acute findings. Hepatobiliary: No mass visualized on this unenhanced exam. Several small fluid attenuation hepatic cysts again noted. Gallbladder is unremarkable. Pancreas: No mass or inflammatory process visualized on this unenhanced exam. Spleen:  Within normal limits in size. Adrenals/Urinary tract: Normal adrenal glands. Marked symmetric enlargement of both kidneys is seen with diffuse involvement by innumerable water attenuation and hemorrhagic renal cysts, which are not significantly changed compared to prior study. These findings are consistent with autosomal dominant polycystic kidney disease. Several small less than 1 cm left renal calculi are seen. No evidence of ureteral calculi or hydronephrosis. Unremarkable unopacified urinary bladder. Stomach/Bowel: No evidence of obstruction, inflammatory process, or abnormal fluid collections. Diverticulosis is seen mainly involving the descending and sigmoid colon, however there is no evidence of diverticulitis. Vascular/Lymphatic: No pathologically enlarged lymph nodes identified. No evidence of abdominal aortic aneurysm. Aortic atherosclerosis. A stent again seen in the right external iliac artery. Reproductive:  No mass or other significant abnormality. Other:  None. Musculoskeletal: Previous interbody fusions again seen at L2-3 and L3-4. New destruction of intervertebral disc spaces and adjacent vertebral body endplates are seen at X3-2 and L4-5 since prior study of 10/13/2017; infectious discitis can not be excluded.  IMPRESSION: Autosomal dominant polycystic kidney disease, without significant change since prior study. Several small left renal calculi. No evidence of ureteral calculi or hydronephrosis. New destruction of intervertebral disc spaces and adjacent vertebral endplates at G4-0 and N0-2, suspicious for infectious discitis. Recommend clinical correlation and consider lumbar spine MRI without and with contrast for further evaluation. Electronically Signed   By: Earle Gell M.D.   On: 03/31/2018 20:38    Orson Eva, DO  [facility] Pager [phone]  If 7PM-7AM, please contact night-coverage [email or web address] Password [email or web address] 04/02/2018, 6:04 PM   LOS: 2 days

## 2018-04-02 NOTE — Progress Notes (Signed)
Subjective: CC: hematuria.  Hx: Aaron [name] reports that his urine is clearing.  He has no dysuria or flank pain.  ROS:  Review of Systems  Constitutional: Negative for fever.  Musculoskeletal: Positive for back pain.    Anti-infectives: Anti-infectives (From admission, onward)   Start     Dose/Rate Route Frequency Ordered Stop   04/03/18 2000  vancomycin (VANCOCIN) IVPB 1000 mg/200 mL premix     1,000 mg 200 mL/hr over 60 Minutes Intravenous Every 48 hours 04/01/18 1904     04/01/18 2200  cefTRIAXone (ROCEPHIN) 1 g in sodium chloride 0.9 % 100 mL IVPB  Status:  Discontinued     1 g 200 mL/hr over 30 Minutes Intravenous Every 24 hours 03/31/18 2147 04/01/18 0659   04/01/18 2200  cefTRIAXone (ROCEPHIN) 2 g in sodium chloride 0.9 % 100 mL IVPB     2 g 200 mL/hr over 30 Minutes Intravenous Every 12 hours 04/01/18 1825     04/01/18 2000  vancomycin (VANCOCIN) 1,500 mg in sodium chloride 0.9 % 500 mL IVPB     1,500 mg 250 mL/hr over 120 Minutes Intravenous  Once 04/01/18 1904 04/01/18 2243   03/31/18 2130  cefTRIAXone (ROCEPHIN) 1 g in sodium chloride 0.9 % 100 mL IVPB     1 g 200 mL/hr over 30 Minutes Intravenous  Once 03/31/18 2126 03/31/18 2208      Current Facility-Administered Medications  Medication Dose Route Frequency Provider Last Rate Last Dose  . 0.9 %  sodium chloride infusion  250 mL Intravenous PRN Derrill Kay A, MD      . albuterol (PROVENTIL) (2.5 MG/3ML) 0.083% nebulizer solution 3 mL  3 mL Inhalation Q6H PRN Derrill Kay A, MD      . cefTRIAXone (ROCEPHIN) 2 g in sodium chloride 0.9 % 100 mL IVPB  2 g Intravenous Therisa Doyne, MD 200 mL/hr at 04/01/18 2300 2 g at 04/01/18 2300  . diltiazem (CARDIZEM CD) 24 hr capsule 360 mg  360 mg Oral Daily Derrill Kay A, MD   360 mg at 04/01/18 0915  . feeding supplement (ENSURE ENLIVE) (ENSURE ENLIVE) liquid 237 mL  237 mL Oral TID BM Tat, Shanon Brow, MD   237 mL at 04/01/18 1411  . furosemide (LASIX) tablet 40 mg  40 mg Oral  Daily Derrill Kay A, MD   40 mg at 04/01/18 0916  . hydrALAZINE (APRESOLINE) tablet 50 mg  50 mg Oral Q8H Phillips Grout, MD   50 mg at 04/02/18 0616  . metoprolol tartrate (LOPRESSOR) tablet 100 mg  100 mg Oral BID Derrill Kay A, MD   100 mg at 04/01/18 2117  . oxyCODONE (Oxy IR/ROXICODONE) immediate release tablet 5 mg  5 mg Oral Q4H PRN Phillips Grout, MD   5 mg at 04/02/18 0616  . pantoprazole (PROTONIX) EC tablet 40 mg  40 mg Oral BID Phillips Grout, MD   40 mg at 04/01/18 2117  . sodium bicarbonate tablet 650 mg  650 mg Oral BID Elmarie Shiley, MD   650 mg at 04/01/18 2116  . sodium chloride flush (NS) 0.9 % injection 3 mL  3 mL Intravenous Q12H Derrill Kay A, MD   3 mL at 04/01/18 2117  . sodium chloride flush (NS) 0.9 % injection 3 mL  3 mL Intravenous PRN Phillips Grout, MD      . Derrill Memo ON 04/03/2018] vancomycin (VANCOCIN) IVPB 1000 mg/200 mL premix  1,000 mg Intravenous Q48H Orson Eva, MD  Objective: Vital signs in last 24 hours: Temp:  [97.7 F (36.5 C)-98.4 F (36.9 C)] 98.2 F (36.8 C) (12/20 0603) Pulse Rate:  [59-80] 69 (12/20 0603) Resp:  [18-19] 18 (12/20 0603) BP: (145-155)/(100-102) 145/101 (12/20 0603) SpO2:  [97 %-100 %] 100 % (12/20 0603)  Intake/Output from previous day: 12/19 0701 - 12/20 0700 In: 1720.9 [P.O.:960; I.V.:160.9; IV Piggyback:600] Out: 650 [Urine:650] Intake/Output this shift: No intake/output data recorded.   Physical Exam Vitals signs reviewed.  Constitutional:      Appearance: Normal appearance.  Neurological:     Mental Status: He is alert.     Lab Results:  Recent Labs    03/31/18 1825 04/01/18 0507  WBC 9.3 8.4  HGB 12.1* 11.1*  HCT 38.1* 34.4*  PLT 259 244   BMET Recent Labs    04/01/18 0507 04/02/18 0522  NA 135 137  K 4.0 3.7  CL 107 106  CO2 16* 21*  GLUCOSE 81 101*  BUN 53* 53*  CREATININE 5.43* 4.80*  CALCIUM 8.3* 8.1*   PT/INR No results for input(s): LABPROT, INR in the last 72  hours. ABG No results for input(s): PHART, HCO3 in the last 72 hours.  Invalid input(s): PCO2, PO2  Studies/Results: Mr Lumbar Spine Wo Contrast  Result Date: 04/01/2018 CLINICAL DATA:  Low back pain radiating down both legs for 2 months. No discitis osteomyelitis with antibiotics completed last week EXAM: MRI LUMBAR SPINE WITHOUT CONTRAST TECHNIQUE: Multiplanar, multisequence MR imaging of the lumbar spine was performed. No intravenous contrast was administered. COMPARISON:  Abdominal CT from yesterday.  Lumbar MRI 01/06/2018 FINDINGS: Segmentation:  5 lumbar type vertebral bodies Alignment:  Reversal of lumbar lordosis.  L5-S1 retrolisthesis Vertebrae: T2 hyperintensity within the disc spaces of L1-2 and L4-5, with marrow edema and endplate destruction that has significantly progressed at L1-2-leading to kyphotic deformity and endplate ridging. Less extensive progression at L4-5. There is still paravertebral inflammation without evident collection. Periarticular edema about the L1-2 and L4-5 facets which may be related to altered weight-bearing as there is no underlying facet joint destruction to imply facet arthritis. Conus medullaris and cauda equina: Conus extends to the L1 level. Conus and cauda equina appear normal. Paraspinal and other soft tissues: Paravertebral/psoas edema as noted above. 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 Disc levels: T12- L1: Unremarkable. L1-L2: Infectious disc collapse with endplate ridging. There is moderate spinal stenosis. Advanced right foraminal stenosis with L1 compression. L2-L3: Remote PLIF.  No solid arthrodesis by CT.  No impingement L3-L4: Remote PLIF.  No solid arthrodesis by CT.  No impingement L4-L5: Discitis osteomyelitis. Degenerative facet spurring and degenerative ridging. No impingement L5-S1:Disc narrowing and bulging with retrolisthesis. Degenerative facet spurring. Moderate right foraminal stenosis. IMPRESSION: 1. 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 Degree of residual marrow and paravertebral edema is compatible with ongoing inflammation/infection. There is advanced right foraminal impingement at L1-2 from disc collapse. No evident collection. 2. Chronic findings described above. Electronically Signed   By: Monte Fantasia M.D.   On: 04/01/2018 09:03   Ct Renal Stone Study  Result Date: 03/31/2018 CLINICAL DATA:  Flank pain for 2 months. Hematuria for several days. Suspect stone disease. EXAM: CT ABDOMEN AND PELVIS WITHOUT CONTRAST TECHNIQUE: Multidetector CT imaging of the abdomen and pelvis was performed following the standard protocol without IV contrast. COMPARISON:  10/13/2017 FINDINGS: Lower chest: No acute findings. Hepatobiliary: No mass visualized on this unenhanced exam. Several  small fluid attenuation hepatic cysts again noted. Gallbladder is unremarkable. Pancreas: No mass or inflammatory process visualized on this unenhanced exam. Spleen:  Within normal limits in size. Adrenals/Urinary tract: Normal adrenal glands. Marked symmetric enlargement of both kidneys is seen with diffuse involvement by innumerable water attenuation and hemorrhagic renal cysts, which are not significantly changed compared to prior study. These findings are consistent with autosomal dominant polycystic kidney disease. Several small less than 1 cm left renal calculi are seen. No evidence of ureteral calculi or hydronephrosis. Unremarkable unopacified urinary bladder. Stomach/Bowel: No evidence of obstruction, inflammatory process, or abnormal fluid collections. Diverticulosis is seen mainly involving the descending and sigmoid colon, however there is no evidence of diverticulitis. Vascular/Lymphatic: No pathologically enlarged lymph nodes identified. No evidence of abdominal aortic aneurysm. Aortic atherosclerosis. A stent again seen in the right external iliac  artery. Reproductive:  No mass or other significant abnormality. Other:  None. Musculoskeletal: Previous interbody fusions again seen at L2-3 and L3-4. New destruction of intervertebral disc spaces and adjacent vertebral body endplates are seen at Z6-1 and L4-5 since prior study of 10/13/2017; infectious discitis can not be excluded. IMPRESSION: Autosomal dominant polycystic kidney disease, without significant change since prior study. Several small left renal calculi. No evidence of ureteral calculi or hydronephrosis. New destruction of intervertebral disc spaces and adjacent vertebral endplates at W9-6 and E4-5, suspicious for infectious discitis. Recommend clinical correlation and consider lumbar spine MRI without and with contrast for further evaluation. Electronically Signed   By: Earle Gell M.D.   On: 03/31/2018 20:38     Assessment and Plan: Hematuria getting better per patient report.   He will need f/u in our office for cystoscopy as an outpatient.       LOS: 2 days    Irine Seal 04/02/2018 [phone] ID: Aaron [name], male   DOB: February 14, 1956, 62 y.o.   MRN: [id number]

## 2018-04-02 NOTE — Care Management Important Message (Signed)
Important Message  Patient Details  Name: LUSTER HECHLER MRN: [id number] Date of Birth: Sep 25, 1955   Medicare Important Message Given:  Yes    Shelda Altes 04/02/2018, 1:53 PM

## 2018-04-03 ENCOUNTER — Inpatient Hospital Stay (HOSPITAL_COMMUNITY): Payer: 59

## 2018-04-03 DIAGNOSIS — N3001 Acute cystitis with hematuria: Secondary | ICD-10-CM

## 2018-04-03 LAB — CBC
HEMATOCRIT: 32.4 % — AB (ref 39.0–52.0)
HEMOGLOBIN: 10.3 g/dL — AB (ref 13.0–17.0)
MCH: 30.7 pg (ref 26.0–34.0)
MCHC: 31.8 g/dL (ref 30.0–36.0)
MCV: 96.7 fL (ref 80.0–100.0)
Platelets: 229 10*3/uL (ref 150–400)
RBC: 3.35 MIL/uL — ABNORMAL LOW (ref 4.22–5.81)
RDW: 14.2 % (ref 11.5–15.5)
WBC: 7 10*3/uL (ref 4.0–10.5)
nRBC: 0 % (ref 0.0–0.2)

## 2018-04-03 LAB — BASIC METABOLIC PANEL
Anion gap: 8 (ref 5–15)
BUN: 55 mg/dL — ABNORMAL HIGH (ref 8–23)
CHLORIDE: 105 mmol/L (ref 98–111)
CO2: 21 mmol/L — ABNORMAL LOW (ref 22–32)
Calcium: 8.1 mg/dL — ABNORMAL LOW (ref 8.9–10.3)
Creatinine, Ser: 4.62 mg/dL — ABNORMAL HIGH (ref 0.61–1.24)
GFR calc Af Amer: 15 mL/min — ABNORMAL LOW (ref >60)
GFR calc non Af Amer: 13 mL/min — ABNORMAL LOW (ref >60)
Glucose, Bld: 111 mg/dL — ABNORMAL HIGH (ref 70–99)
Potassium: 3.8 mmol/L (ref 3.5–5.1)
Sodium: 134 mmol/L — ABNORMAL LOW (ref 135–145)

## 2018-04-03 LAB — URINE CULTURE: Culture: >=100000 — AB

## 2018-04-03 NOTE — ED Provider Notes (Signed)
[name] Provider Note   CSN: [id number] Arrival date & time: 03/31/18  1433     History   Chief Complaint Chief Complaint  Patient presents with  . Back Pain    HPI Aaron [name] is a 62 y.o. male.  Patient complains of back pain.  Patient has a history of disc problems  The history is provided by the patient. No language interpreter was used.  Back Pain   This is a new problem. The current episode started more than 2 days ago. The problem occurs constantly. The problem has not changed since onset.The pain is associated with no known injury. The pain is present in the lumbar spine. The quality of the pain is described as aching. The pain does not radiate. The pain is at a severity of 5/10. The pain is moderate. The symptoms are aggravated by bending. Pertinent negatives include no chest pain, no headaches and no abdominal pain. He has tried nothing for the symptoms. The treatment provided no relief. Risk factors: Previous back problems.    Past Medical History:  Diagnosis Date  . Anxiety   . Arthritis    knees , back , shoulders (10/12/2017)  . Atrial fibrillation ([place])   . CAD (coronary artery disease)    Mild nonobstructive disease at cardiac catheterization 2002  . Chronic back pain    "back of my neck; down thru my legs" (10/12/2017)  . COPD (chronic obstructive pulmonary disease) ([place])   . Diverticulitis   . Esophageal reflux   . ESRD (end stage renal disease) on dialysis [facility])    "TTS; [place]" (11/23/2017)  . Essential hypertension   . Hepatitis C    states he no longer has this  . History of kidney stones   . History of syncope   . Hyperlipidemia   . Jerking 09/23/2014  . Myocardial infarction ([place]) 02/2017   "light one" (10/12/2017)  . Non-compliant behavior    non compliant with diaylsis per daughter  . PAT (paroxysmal atrial tachycardia) ([place])   . Peripheral arterial disease ([place])    Occluded left superficial femoral artery status post  stent June 2016 - Dr. Trula Slade  . Pneumonia 1961  . Polycystic kidney, unspecified type   . Syncope 09/2014  . SYNCOPE 05/07/2010   Qualifier: Diagnosis of  By: Laurance Flatten RN, BSN, [facility]      Patient Active Problem List   Diagnosis Date Noted  . Vertebral osteomyelitis ([place]) 04/02/2018  . Back pain 03/31/2018  . Noncompliance with medication regimen 03/31/2018  . CKD (chronic kidney disease) stage 5, GFR less than 15 ml/min (HCC) 02/03/2018  . COPD with acute exacerbation ([place]) 02/03/2018  . Atrial fibrillation, chronic 02/03/2018  . Acute encephalopathy   . Acute respiratory failure ([place]) 01/31/2018  . Fever   . Sepsis due to undetermined organism ([place]) 01/27/2018  . Discitis 01/27/2018  . Discitis of lumbar region 01/09/2018  . Paraspinal abscess ([place]) 01/08/2018  . ESRD on dialysis ([place]) 12/30/2017  . Bacteremia 12/07/2017  . Hematoma of arm, right, initial encounter 11/23/2017  . Protein-calorie malnutrition, severe 10/15/2017  . Hyperlipidemia 08/24/2017  . Schatzki's ring of distal esophagus 04/27/2017  . Encounter for therapeutic drug monitoring 03/03/2017  . History of hepatitis C 02/20/2017  . Ischemic cardiomyopathy 01/27/2017  . Dyslipidemia 01/27/2017  . AF (paroxysmal atrial fibrillation) ([place]) 01/27/2017  . CAD (coronary artery disease) 01/21/2017  . Hepatitis C 12/15/2016  . Polycystic kidney disease 12/14/2016  . COPD (chronic obstructive  pulmonary disease) ([place])   . COLD (chronic obstructive lung disease) ([place])   . Depression   . PAD (peripheral artery disease) ([place]) 09/19/2014  . Preop cardiovascular exam 10/28/2010  . Tobacco abuse 10/28/2010  . UNSPECIFIED IRON DEFICIENCY ANEMIA 10/04/2009  . 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  . Essential hypertension 10/04/2009  . Esophageal reflux 10/04/2009  . PRECORDIAL PAIN 10/04/2009    Past Surgical History:  Procedure Laterality Date  . ABDOMINAL AORTOGRAM N/A 08/11/2017   Procedure: ABDOMINAL AORTOGRAM;   Surgeon: Serafina Mitchell, MD;  Location: [facility];  Service: Cardiovascular;  Laterality: N/A;  . ANTERIOR CERVICAL DECOMP/DISCECTOMY FUSION    . APPLICATION OF WOUND VAC Right 08/14/2017   Procedure: APPLICATION OF PREVENA INCISIONAL WOUND VAC RIGHT GROIN;  Surgeon: Serafina Mitchell, MD;  [facility];  Service: Vascular;  Laterality: Right;  . AV FISTULA PLACEMENT Left 09/04/2017   Procedure: ARTERIOVENOUS (AV) FISTULA CREATION LEFT UPPER ARM;  Surgeon: Serafina Mitchell, MD;  Location: [place];  Service: Vascular;  Laterality: Left;  . BACK SURGERY    . BASCILIC VEIN TRANSPOSITION Left 11/20/2017   Procedure: SECOND STAGE BASILIC VEIN TRANSPOSITION LEFT ARM;  Surgeon: Serafina Mitchell, MD;  Location: [place];  Service: Vascular;  Laterality: Left;  . BIOPSY  12/17/2016   Procedure: BIOPSY;  Surgeon: Daneil Dolin, MD;  [facility];  Service: Gastroenterology;;  gastric colon  . BIOPSY  04/29/2017   Procedure: BIOPSY;  Surgeon: Daneil Dolin, MD;  [facility];  Service: Endoscopy;;  duodenal biopsies  . BUNIONECTOMY Bilateral   . COLONOSCOPY  2008   Dr. Oneida Alar: rare sigmoid colon diverticulosis, internal hemorrhoids.   . COLONOSCOPY WITH PROPOFOL N/A 12/17/2016   dense left-sided diverticulosis, right colon ulcers s/p biopsy query occult NSAID use vs transient ischemia, not consistent with IBD. CMV stains negative.   Marland Kitchen ENDARTERECTOMY FEMORAL Right 08/14/2017   Procedure: RIGHT ILLIO-FEMORAL ENDARTERECTOMY;  Surgeon: Serafina Mitchell, MD;  Location: [facility] OR;  Service: Vascular;  Laterality: Right;  . ESOPHAGEAL DILATION  12/17/2016   EGD with mild Schatzki's ring s/p dilatation, small hiatal hernia, erosive gastropathy (negative H.pylori gastritis)  . ESOPHAGOGASTRODUODENOSCOPY  2008   Dr. Oneida Alar: normal esophagus without Barrett's, antritis and duodenitis, path with H.pylori gastritis  . ESOPHAGOGASTRODUODENOSCOPY (EGD) WITH PROPOFOL N/A 12/17/2016   Procedure:  ESOPHAGOGASTRODUODENOSCOPY (EGD) WITH PROPOFOL;  Surgeon: Daneil Dolin, MD;  [facility];  Service: Gastroenterology;  Laterality: N/A;  . ESOPHAGOGASTRODUODENOSCOPY (EGD) WITH PROPOFOL N/A 04/29/2017   Patchy erythema of gastric mucosa diffusely, extensive inflammatory changes in duodenum, geographic ulceration and mucosal edema present, encroaching somewhat on the lumen yet still widely patent, distal second portion of duodenum appeared abnormal, path with peptic duodenitis with ulceration  . GROIN DEBRIDEMENT Right 10/14/2017   Procedure: RIGHT GROIN AND RIGHT LOWER QUADRANT ABDOMEN DEBRIDEMENT WITH PLACEMENT OF ANTIBIOTIC BEADS;  Surgeon: Serafina Mitchell, MD;  Location: [place];  Service: Vascular;  Laterality: Right;  . HEMATOMA EVACUATION Left 12/06/2017   Procedure: EVACUATION HEMATOMA;  Surgeon: Waynetta Sandy, MD;  Location: [place];  Service: Vascular;  Laterality: Left;  . HERNIA REPAIR  6045   umbilical  . I&D EXTREMITY Right 09/21/2017   Procedure: IRRIGATION AND DEBRIDEMENT GROIN;  Surgeon: Elam Dutch, MD;  Location: [facility] OR;  Service: Vascular;  Laterality: Right;  . I&D EXTREMITY Left 11/23/2017   Procedure: Evacuation Hematoma LEFT UPPER ARM GRAFT;  Surgeon: Elam Dutch, MD;  [facility];  Service: Vascular;  Laterality: Left;  . INGUINAL HERNIA REPAIR Right 02/2017   [place]  . INSERTION OF DIALYSIS CATHETER Right 09/04/2017   Procedure: INSERTION OF TUNNELED  DIALYSIS CATHETER - RIGHT INTERNAL JUGULAR PLACEMENT;  Surgeon: Serafina Mitchell, MD;  Location: [place];  Service: Vascular;  Laterality: Right;  . INSERTION OF ILIAC STENT Right 08/14/2017   Procedure: INSERTION OF RIGHT COMMON ILIAC STENT 41mm x 54mm x 130cm INSERTION OF RIGHT EXTERNAL ILIAC STENT 66mm x 7mm x 130cm INSERTION OF SUPERFICIAL FERMORAL ARTERY STENT 76mm x 19mm x 130cm;  Surgeon: Serafina Mitchell, MD;  Location[facility] OR;  Service: Vascular;  Laterality: Right;  . IR FLUORO GUIDE CV  LINE RIGHT  08/31/2017  . IR US GUIDE VASC ACCESS RIGHT  08/31/2017  . LOWER EXTREMITY ANGIOGRAPHY Right 08/11/2017   Procedure: LOWER EXTREMITY ANGIOGRAPHY;  Surgeon: Serafina Mitchell, MD;  Location: [facility];  Service: Cardiovascular;  Laterality: Right;  . PATCH ANGIOPLASTY Right 08/14/2017   Procedure: PATCH ANGIOPLASTY USING HEMASHIELD PATCH 0.3IN Lillie [place];  Surgeon: Serafina Mitchell, MD;  [facility];  Service: Vascular;  Laterality: Right;  . PERIPHERAL VASCULAR CATHETERIZATION N/A 09/20/2014   Procedure: Abdominal Aortogram;  Surgeon: Serafina Mitchell, MD;  Location: [place] [facility];  Service: Cardiovascular;  Laterality: N/A;  . POSTERIOR FUSION LUMBAR SPINE    . TEE WITHOUT CARDIOVERSION N/A 01/12/2018   Procedure: TRANSESOPHAGEAL ECHOCARDIOGRAM (TEE);  Surgeon: Lelon Perla, MD;  Location[facility] ENDOSCOPY;  Service: Cardiovascular;  Laterality: N/A;        Home Medications    Prior to Admission medications   Medication Sig Start Date End Date Taking? Authorizing Provider  albuterol (PROVENTIL HFA;VENTOLIN HFA) 108 (90 Base) MCG/ACT inhaler Inhale 2 puffs into the lungs every 6 (six) hours as needed for wheezing or shortness of breath.   Yes [provider]  BREO ELLIPTA 200-25 MCG/INH AEPB Inhale 1 puff into the lungs every evening.  07/27/17  Yes [provider]  cholecalciferol (VITAMIN D) 1000 units tablet Take 2,000 Units by mouth daily.   Yes [provider]  clopidogrel (PLAVIX) 75 MG tablet Take 75 mg by mouth daily.  12/24/17  Yes [provider]  diltiazem (CARDIZEM CD) 360 MG 24 hr capsule Take 1 capsule (360 mg total) by mouth daily. 02/09/18  Yes Tat, Shanon Brow, MD  fluticasone (FLONASE) 50 MCG/ACT nasal spray Place 1 spray into both nostrils daily.   Yes [provider]  furosemide (LASIX) 40 MG tablet Take 1 tablet (40 mg total) by mouth daily. 02/09/18  Yes Tat, Shanon Brow, MD  hydrALAZINE (APRESOLINE) 50 MG tablet Take 1  tablet (50 mg total) by mouth every 8 (eight) hours. 02/08/18  Yes Tat, Shanon Brow, MD  metoprolol tartrate (LOPRESSOR) 100 MG tablet Take 1 tablet (100 mg total) by mouth 2 (two) times daily. 02/08/18  Yes Tat, Shanon Brow, MD  oxyCODONE-acetaminophen (PERCOCET) 10-325 MG tablet Take 1 tablet by mouth 4 (four) times daily. 02/12/18  Yes [provider]  pantoprazole (PROTONIX) 40 MG tablet Take 1 tablet (40 mg total) by mouth 2 (two) times daily. 04/30/17 01/27/26 Yes Shah, Pratik D, DO  traZODone (DESYREL) 50 MG tablet Take 50 mg by mouth at bedtime.   Yes [provider]    Family History Family History  Problem Relation Age of Onset  . Alcoholism Mother   . Heart disease Father        Massive  heart attack  . Heart attack Father   . Atrial fibrillation Father   . Colon cancer Father   . Colon cancer Maternal Grandfather 70  . Alcoholism Maternal Grandfather   . Renal cancer Cousin   . Ovarian cancer Sister     Social History Social History   Tobacco Use  . Smoking status: Current Every Day Smoker    Packs/day: 2.00    Years: 47.00    Pack years: 94.00    Types: Cigarettes    Last attempt to quit: 08/03/2017    Years since quitting: 0.6  . Smokeless tobacco: Never Used  Substance Use Topics  . Alcohol use: Not Currently    Comment: 10/12/2017 alcohol free since 2017,  heavy drinker in the past  . Drug use: Not Currently    Comment: "<2003 whatever was around; nothing since"     Allergies   Patient has no known allergies.   Review of Systems Review of Systems  Constitutional: Negative for appetite change and fatigue.  HENT: Negative for congestion, ear discharge and sinus pressure.   Eyes: Negative for discharge.  Respiratory: Negative for cough.   Cardiovascular: Negative for chest pain.  Gastrointestinal: Negative for abdominal pain and diarrhea.  Genitourinary: Negative for frequency and hematuria.  Musculoskeletal: Positive for back pain.  Skin: Negative  for rash.  Neurological: Negative for seizures and headaches.  Psychiatric/Behavioral: Negative for hallucinations.     Physical Exam Updated Vital Signs BP 124/86 (BP Location: Right Arm)   Pulse 61   Temp 98.5 F (36.9 C) (Oral)   Resp 19   Ht 6\' 4"  (1.93 m)   Wt 70 kg   SpO2 97%   BMI 18.79 kg/m   Physical Exam Constitutional:      Appearance: He is well-developed.  HENT:     Head: Normocephalic.     Nose: Nose normal.  Eyes:     General: No scleral icterus.    Conjunctiva/sclera: Conjunctivae normal.  Neck:     Musculoskeletal: Neck supple.     Thyroid: No thyromegaly.  Cardiovascular:     Rate and Rhythm: Normal rate and regular rhythm.     Heart sounds: No murmur. No friction rub. No gallop.   Pulmonary:     Breath sounds: No stridor. No wheezing or rales.  Chest:     Chest wall: No tenderness.  Abdominal:     General: There is no distension.     Tenderness: There is no abdominal tenderness. There is no rebound.  Musculoskeletal: Normal range of motion.     Comments: Tender lumbar spine  Lymphadenopathy:     Cervical: No cervical adenopathy.  Skin:    Findings: No erythema or rash.  Neurological:     Mental Status: He is alert and oriented to person, place, and time.     Motor: No abnormal muscle tone.     Coordination: Coordination normal.  Psychiatric:        Behavior: Behavior normal.      ED Treatments / Results  Labs (all labs ordered are listed, but only abnormal results are displayed) Labs Reviewed  URINE CULTURE - Abnormal; Notable for the following components:      Result Value   Culture >=100,000 COLONIES/mL KLEBSIELLA PNEUMONIAE (*)    Organism ID, Bacteria KLEBSIELLA PNEUMONIAE (*)    All other components within normal limits  URINALYSIS, ROUTINE W REFLEX MICROSCOPIC - Abnormal; Notable for the following components:   Color, Urine AMBER (*)  APPearance CLOUDY (*)    Hgb urine dipstick LARGE (*)    Protein, ur 100 (*)     Leukocytes, UA LARGE (*)    RBC / HPF >50 (*)    WBC, UA >50 (*)    Bacteria, UA FEW (*)    All other components within normal limits  CBC WITH DIFFERENTIAL/PLATELET - Abnormal; Notable for the following components:   RBC 3.90 (*)    Hemoglobin 12.1 (*)    HCT 38.1 (*)    All other components within normal limits  COMPREHENSIVE METABOLIC PANEL - Abnormal; Notable for the following components:   CO2 19 (*)    BUN 53 (*)    Creatinine, Ser 5.48 (*)    Calcium 8.5 (*)    AST 10 (*)    GFR calc non Af Amer 10 (*)    GFR calc Af Amer 12 (*)    All other components within normal limits  BASIC METABOLIC PANEL - Abnormal; Notable for the following components:   CO2 16 (*)    BUN 53 (*)    Creatinine, Ser 5.43 (*)    Calcium 8.3 (*)    GFR calc non Af Amer 10 (*)    GFR calc Af Amer 12 (*)    All other components within normal limits  CBC - Abnormal; Notable for the following components:   RBC 3.50 (*)    Hemoglobin 11.1 (*)    HCT 34.4 (*)    All other components within normal limits  SEDIMENTATION RATE - Abnormal; Notable for the following components:   Sed Rate 65 (*)    All other components within normal limits  C-REACTIVE PROTEIN - Abnormal; Notable for the following components:   CRP 5.6 (*)    All other components within normal limits  BASIC METABOLIC PANEL - Abnormal; Notable for the following components:   CO2 21 (*)    Glucose, Bld 101 (*)    BUN 53 (*)    Creatinine, Ser 4.80 (*)    Calcium 8.1 (*)    GFR calc non Af Amer 12 (*)    GFR calc Af Amer 14 (*)    All other components within normal limits  BASIC METABOLIC PANEL - Abnormal; Notable for the following components:   Sodium 134 (*)    CO2 21 (*)    Glucose, Bld 111 (*)    BUN 55 (*)    Creatinine, Ser 4.62 (*)    Calcium 8.1 (*)    GFR calc non Af Amer 13 (*)    GFR calc Af Amer 15 (*)    All other components within normal limits  CBC - Abnormal; Notable for the following components:   RBC 3.35 (*)      Hemoglobin 10.3 (*)    HCT 32.4 (*)    All other components within normal limits  CULTURE, BLOOD (ROUTINE X 2)  CULTURE, BLOOD (ROUTINE X 2)    EKG None  Radiology No results found.  Procedures Procedures (including critical care time)  Medications Ordered in ED Medications  diltiazem (CARDIZEM CD) 24 hr capsule 360 mg (360 mg Oral Given 04/03/18 1113)  furosemide (LASIX) tablet 40 mg (40 mg Oral Given 04/03/18 1113)  hydrALAZINE (APRESOLINE) tablet 50 mg (50 mg Oral Given 04/03/18 0522)  metoprolol tartrate (LOPRESSOR) tablet 100 mg (100 mg Oral Given 04/03/18 1113)  pantoprazole (PROTONIX) EC tablet 40 mg (40 mg Oral Given 04/03/18 1113)  albuterol (PROVENTIL) (2.5 MG/3ML) 0.083% nebulizer  solution 3 mL (has no administration in time range)  sodium chloride flush (NS) 0.9 % injection 3 mL (3 mLs Intravenous Given 04/03/18 1114)  sodium chloride flush (NS) 0.9 % injection 3 mL (has no administration in time range)  0.9 %  sodium chloride infusion (has no administration in time range)  oxyCODONE (Oxy IR/ROXICODONE) immediate release tablet 5 mg (5 mg Oral Given 04/03/18 0821)  sodium chloride 0.45 % 1,000 mL with sodium bicarbonate 75 mEq infusion ( Intravenous New Bag/Given 04/01/18 1146)  sodium bicarbonate tablet 650 mg (650 mg Oral Given 04/03/18 1112)  feeding supplement (ENSURE ENLIVE) (ENSURE ENLIVE) liquid 237 mL (237 mLs Oral Given 04/03/18 1112)  vancomycin (VANCOCIN) 1,500 mg in sodium chloride 0.9 % 500 mL IVPB (1,500 mg Intravenous New Bag/Given 04/01/18 2043)    Followed by  vancomycin (VANCOCIN) IVPB 1000 mg/200 mL premix (has no administration in time range)  cefTRIAXone (ROCEPHIN) 2 g in sodium chloride 0.9 % 100 mL IVPB (2 g Intravenous New Bag/Given 04/03/18 1112)  cefTRIAXone (ROCEPHIN) 1 g in sodium chloride 0.9 % 100 mL IVPB ( Intravenous Stopped 03/31/18 2208)  oxyCODONE-acetaminophen (PERCOCET/ROXICET) 5-325 MG per tablet 2 tablet (2 tablets Oral Given  03/31/18 2133)  Influenza vac split quadrivalent PF (FLUARIX) injection 0.5 mL (0.5 mLs Intramuscular Given 04/01/18 0917)     Initial Impression / Assessment and Plan / ED Course  I have reviewed the triage vital signs and the nursing notes.  Pertinent labs & imaging results that were available during my care of the patient were reviewed by me and considered in my medical decision making (see chart for details).     Patient with urinary tract infection and possible discitis and possible osteomyelitis of lumbar spine.  Patient will be admitted to medicine for further work-up and MRI tomorrow Final Clinical Impressions(s) / ED Diagnoses   Final diagnoses:  None    ED Discharge Orders    None       Milton Ferguson, MD 04/03/18 1124

## 2018-04-03 NOTE — Plan of Care (Signed)
Per Dr. Carles Collet, patient to be discharged tomorrow. [facility] unable to come on weekends. Per Dr. Carles Collet, nurse to arrange PICC Line insertion with vascular wellness.

## 2018-04-03 NOTE — Progress Notes (Signed)
Spoke with Vita Barley RN re PICC order.   Per MD notes. Pt d/c in 2-3 days.  Dr Tat states that PICC is ok by nephrology.  PIV working well per Therapist, sports.   RN aware of PICC placement Monday am prior to d/c plan.  RN called back to state that Dr Tat requested to have [place]  place PICC.

## 2018-04-03 NOTE — Progress Notes (Signed)
PROGRESS NOTE  Aaron [name]:[id number] DOB: 1956/03/02 DOA: 03/31/2018 PCP: Gwenlyn Saran [place]  Brief History: 62 year old with a history of anxiety, osteoarthritis, atrial fibrillation, CAD, chronic back pain,lumbar discitis and paraspinal abscessCOPD, ESRDpreviouslyon hemodialysis, GERD, hepatitis C, essential hypertension, PAD, medical noncompliance came to the ERsecondary to increasing low back pain and intermittent hematuria. The patient patient was most recently admitted from 01/31/2018 through 02/08/2018. During that hospitalization, the patient had respiratory failure secondary to overuse of opioids and hypnotic medications. The patient was supposed to discharge to [facility] receive intravenous antibiotics for his lumbar discitis and paraspinal abscess. However, at the last minute the patient changed his mind and refused IV antibiotics and SNF placement. The patient was sent home with amox/clav and ciprofor 5 additional weeks. He claims compliance to the antibiotics. He stated that he has had worsening back pain in the past week since he finished antibiotics. He has had some radicular pain down his left leg. Because of his back pain, he has had difficulty ambulating resulting in 3 mechanical falls in the past 2 days. He denies any syncope. He denies any fevers, chills, nausea, vomiting, diarrhea. He states he has had intermittent hematuria for the past week. He had followed up with his outpatient nephrologist who told him to present to the emergency department for further evaluation.  Regarding his recent medical history,the patient was recently admitted to the hospital from 01/26/2018 through 01/29/2018 during which the patient refused dialysis and was subsequently taken off of dialysis by nephrology. His dialysis catheter was removed at that time. The patient has had a long history of noncompliance including leaving AMA from  the hospitalization 01/09/2018 through 01/13/2018.   Assessment/Plan: Lumbar discitis and vertebral osteomyelitis -Suggested on CT abdomen and pelvis -04/01/2018 MRI lumbar spine--L1-2 and L4-5 discitis and osteomyelitis with progressive disc and vertebral collapse.  Advanced right foraminal impingement L1-2 -Given the patient's long history of poor compliance, I question whether he truly took his oral antibiotics -Blood cultures x2 sets--unfortunately, this will likely be compromised as the patient received a dose of ceftriaxone 03/31/2018 at2137 -Check ESR--65 -Check CRP--5.6 -12/04/2017 blood cultures--Serratia -8/25/19left upper extremity wound culture--E.faecalis -Case was discussed with neurosurgery, Dr. Zada Finders continue nonoperative management; no acute indication for surgery -Request vascular wellness to see if they are able to place a IJ PICC line -If unable to place IJ PICC line, patient will have to wait until Monday for IR -Continue vancomycin and ceftriaxone IV  Acute on chronic renal failure--CKD stage V -Baseline creatinine 4.2-4.6 -Consult nephrology--appreciated -Continue bicarbonate -Concerned that he will need to be initiated on hemodialysis -The patient had previously refused hemodialysis. He was poorly compliant not going to his outpatient dialysis center. -Left arm AV fistula ligated secondary to bleeding 12/2017  Chronic atrial fibrillationwith RVR -Continuediltiazem360 mg daily -Continuemetoprolol to 100 mg bid -01/12/2018 TEE--EF 50-55%, no vegetation, mild MR, HK anterior septal -Holding warfarin secondary to hematuria--> patient states that he has not taken in any way  Essential hypertension -Continue metoprolol tartrate and diltiazem  Hematuria -May in part be due to the patient's hemorrhagic renal cysts rupturing as well as UTI -monitor Hgb -urology consult appreciated--> follow-up for outpatient cystoscopy  UTI--Klebsiella -UA > 50  WBC -Continue ceftriaxone  COPD -Stable on room air  Tobacco abuse -I have discussed tobacco cessation with the patient. I have counseled the patient regarding the negative impacts of continued tobacco use including but not limited to  lung cancer, COPD, and cardiovascular disease. I have discussed alternatives to tobacco and modalities that may help facilitate tobacco cessation including but not limited to biofeedback, hypnosis, and medications. Total time spent with tobacco counseling was 4 minutes.  Severe malnutrition -Continue supplements   Disposition Plan: Skilled nursing facility 2 to 3 days Family Communication:Ex-wife updated at the bedside 04/03/2018 Consultants:Renal, urology  Code Status: FULL   DVT Prophylaxis: SCDs   Procedures: As Listed in Progress Note Above  Antibiotics: Ceftriaxone12/19>>> vanco 12/19>>>>  Subjective: Patient denies fevers, chills, headache, chest pain, dyspnea, nausea, vomiting, diarrhea, abdominal pain, dysuria, hematuria, hematochezia, and melena. He complains of back pain that is a little worse than his baseline.  Objective: Vitals:   04/02/18 2152 04/03/18 0519 04/03/18 0854 04/03/18 1440  BP: (!) 150/94 124/86  (!) 157/96  Pulse: 84 61  75  Resp: _0 Temp: 98.2 F (36.8 C) 98.5 F (36.9 C)  98.2 F (36.8 C)  TempSrc: Oral Oral  Oral  SpO2: 95% 98% 97% 97%  Weight:      Height:        Intake/Output Summary (Last 24 hours) at 04/03/2018 1807 Last data filed at 04/03/2018 1300 Gross per 24 hour  Intake 480 ml  Output 1100 ml  Net -620 ml   Weight change:  Exam:   General:  Pt is alert, follows commands appropriately, not in acute distress  HEENT: No icterus, No thrush, No neck mass, /AT  Cardiovascular: RRR, S1/S2, no rubs, no gallops  Respiratory: Diminished breath sounds but CTA bilaterally, no wheezing, no crackles, no rhonchi  Abdomen: Soft/+BS, non tender, non distended, no  guarding  Extremities: No edema, No lymphangitis, No petechiae, No rashes, no synovitis   Data Reviewed: I have personally reviewed following labs and imaging studies Basic Metabolic Panel: Recent Labs  Lab 03/31/18 1825 04/01/18 0507 04/02/18 0522 04/03/18 0629  NA 136 135 137 134*  K 4.0 4.0 3.7 3.8  CL 106 107 106 105  CO2 19* 16* 21* 21*  GLUCOSE 96 81 101* 111*  BUN 53* 53* 53* 55*  CREATININE 5.48* 5.43* 4.80* 4.62*  CALCIUM 8.5* 8.3* 8.1* 8.1*   Liver Function Tests: Recent Labs  Lab 03/31/18 1825  AST 10*  ALT 10  ALKPHOS 106  BILITOT 0.7  PROT 7.0  ALBUMIN 3.5   No results for input(s): LIPASE, AMYLASE in the last 168 hours. No results for input(s): AMMONIA in the last 168 hours. Coagulation Profile: No results for input(s): INR, PROTIME in the last 168 hours. CBC: Recent Labs  Lab 03/31/18 1825 04/01/18 0507 04/03/18 0629  WBC 9.3 8.4 7.0  NEUTROABS 7.4  --   --   HGB 12.1* 11.1* 10.3*  HCT 38.1* 34.4* 32.4*  MCV 97.7 98.3 96.7  PLT 259 244 229   Cardiac Enzymes: No results for input(s): CKTOTAL, CKMB, CKMBINDEX, TROPONINI in the last 168 hours. BNP: Invalid input(s): POCBNP CBG: No results for input(s): GLUCAP in the last 168 hours. HbA1C: No results for input(s): HGBA1C in the last 72 hours. Urine analysis:    Component Value Date/Time   COLORURINE AMBER (A) 03/31/2018 1801   APPEARANCEUR CLOUDY (A) 03/31/2018 1801   LABSPEC 1.011 03/31/2018 1801   PHURINE 6.0 03/31/2018 1801   GLUCOSEU NEGATIVE 03/31/2018 1801   HGBUR LARGE (A) 03/31/2018 1801   BILIRUBINUR NEGATIVE 03/31/2018 1801   KETONESUR NEGATIVE 03/31/2018 1801   PROTEINUR 100 (A) 03/31/2018 1801   UROBILINOGEN 0.2 03/03/2010 1056  NITRITE NEGATIVE 03/31/2018 1801   LEUKOCYTESUR LARGE (A) 03/31/2018 1801   Sepsis Labs: _0 (procalcitonin:4,lacticidven:4) ) Recent Results (from the past 240 hour(s))  Urine C&S     Status: Abnormal   Collection Time: 03/31/18   6:01 PM  Result Value Ref Range Status   Specimen Description   Final    URINE, CLEAN CATCH Performed at [facility], [address], [place], [place] [address]    Special Requests   Final    NONE Performed at [facility], [address] [place], [place] [address]    Culture >=100,000 COLONIES/mL KLEBSIELLA PNEUMONIAE (A)  Final   Report Status 04/03/2018 FINAL  Final   Organism ID, Bacteria KLEBSIELLA PNEUMONIAE (A)  Final      Susceptibility   Klebsiella pneumoniae - MIC*    AMPICILLIN RESISTANT Resistant     CEFAZOLIN <=4 SENSITIVE Sensitive     CEFTRIAXONE <=1 SENSITIVE Sensitive     CIPROFLOXACIN <=0.25 SENSITIVE Sensitive     GENTAMICIN <=1 SENSITIVE Sensitive     IMIPENEM <=0.25 SENSITIVE Sensitive     NITROFURANTOIN 64 INTERMEDIATE Intermediate     TRIMETH/SULFA <=20 SENSITIVE Sensitive     AMPICILLIN/SULBACTAM 4 SENSITIVE Sensitive     PIP/TAZO <=4 SENSITIVE Sensitive     Extended ESBL NEGATIVE Sensitive     * >=100,000 COLONIES/mL KLEBSIELLA PNEUMONIAE  Culture, blood (Routine X 2) w Reflex to ID Panel     Status: None (Preliminary result)   Collection Time: 04/01/18  7:20 AM  Result Value Ref Range Status   Specimen Description BLOOD RIGHT HAND  Final   Special Requests   Final    BOTTLES DRAWN AEROBIC AND ANAEROBIC Blood Culture adequate volume   Culture   Final    NO GROWTH 2 DAYS Performed at [facility], [address] [place], [place] [address]    Report Status PENDING  Incomplete  Culture, blood (Routine X 2) w Reflex to ID Panel     Status: None (Preliminary result)   Collection Time: 04/01/18  7:39 AM  Result Value Ref Range Status   Specimen Description BLOOD RIGHT ARM UPPER  Final   Special Requests   Final    BOTTLES DRAWN AEROBIC AND ANAEROBIC Blood Culture adequate volume   Culture   Final    NO GROWTH 2 DAYS Performed at [facility], [address], [place], [place] [address]    Report Status PENDING  Incomplete     Scheduled Meds: .  diltiazem  360 mg Oral Daily  . feeding supplement (ENSURE ENLIVE)  237 mL Oral TID BM  . furosemide  40 mg Oral Daily  . hydrALAZINE  50 mg Oral Q8H  . metoprolol tartrate  100 mg Oral BID  . pantoprazole  40 mg Oral BID  . sodium bicarbonate  650 mg Oral BID  . sodium chloride flush  3 mL Intravenous Q12H   Continuous Infusions: . sodium chloride    . cefTRIAXone (ROCEPHIN)  IV 2 g (04/03/18 1112)  . vancomycin      Procedures/Studies: Mr Lumbar Spine Wo Contrast  Result Date: 04/01/2018 CLINICAL DATA:  Low back pain radiating down both legs for 2 months. No discitis osteomyelitis with antibiotics completed last week EXAM: MRI LUMBAR SPINE WITHOUT CONTRAST TECHNIQUE: Multiplanar, multisequence MR imaging of the lumbar spine was performed. No intravenous contrast was administered. COMPARISON:  Abdominal CT from yesterday.  Lumbar MRI 01/06/2018 FINDINGS: Segmentation:  5 lumbar type vertebral bodies Alignment:  Reversal of lumbar lordosis.  L5-S1 retrolisthesis Vertebrae: T2 hyperintensity within the disc spaces of L1-2 and L4-5, with marrow edema and endplate destruction that has significantly progressed at L1-2-leading to kyphotic deformity and endplate ridging. Less extensive progression at L4-5. There is still paravertebral inflammation without evident collection. Periarticular edema about the L1-2 and L4-5 facets which may be related to altered weight-bearing as there is no underlying facet joint destruction to imply facet arthritis. Conus medullaris and cauda equina: Conus extends to the L1 level. Conus and cauda equina appear normal. Paraspinal and other soft tissues: Paravertebral/psoas edema as noted above. 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 Disc levels: T12- L1: Unremarkable. L1-L2: Infectious disc collapse with endplate ridging. There is moderate spinal stenosis. Advanced right foraminal stenosis with L1 compression. L2-L3: Remote PLIF.  No solid arthrodesis by CT.   No impingement L3-L4: Remote PLIF.  No solid arthrodesis by CT.  No impingement L4-L5: Discitis osteomyelitis. Degenerative facet spurring and degenerative ridging. No impingement L5-S1:Disc narrowing and bulging with retrolisthesis. Degenerative facet spurring. Moderate right foraminal stenosis. IMPRESSION: 1. 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 Degree of residual marrow and paravertebral edema is compatible with ongoing inflammation/infection. There is advanced right foraminal impingement at L1-2 from disc collapse. No evident collection. 2. Chronic findings described above. Electronically Signed   By: Monte Fantasia M.D.   On: 04/01/2018 09:03   Ct Renal Stone Study  Result Date: 03/31/2018 CLINICAL DATA:  Flank pain for 2 months. Hematuria for several days. Suspect stone disease. EXAM: CT ABDOMEN AND PELVIS WITHOUT CONTRAST TECHNIQUE: Multidetector CT imaging of the abdomen and pelvis was performed following the standard protocol without IV contrast. COMPARISON:  10/13/2017 FINDINGS: Lower chest: No acute findings. Hepatobiliary: No mass visualized on this unenhanced exam. Several small fluid attenuation hepatic cysts again noted. Gallbladder is unremarkable. Pancreas: No mass or inflammatory process visualized on this unenhanced exam. Spleen:  Within normal limits in size. Adrenals/Urinary tract: Normal adrenal glands. Marked symmetric enlargement of both kidneys is seen with diffuse involvement by innumerable water attenuation and hemorrhagic renal cysts, which are not significantly changed compared to prior study. These findings are consistent with autosomal dominant polycystic kidney disease. Several small less than 1 cm left renal calculi are seen. No evidence of ureteral calculi or hydronephrosis. Unremarkable unopacified urinary bladder. Stomach/Bowel: No evidence of obstruction, inflammatory process, or abnormal  fluid collections. Diverticulosis is seen mainly involving the descending and sigmoid colon, however there is no evidence of diverticulitis. Vascular/Lymphatic: No pathologically enlarged lymph nodes identified. No evidence of abdominal aortic aneurysm. Aortic atherosclerosis. A stent again seen in the right external iliac artery. Reproductive:  No mass or other significant abnormality. Other:  None. Musculoskeletal: Previous interbody fusions again seen at L2-3 and L3-4. New destruction of intervertebral disc spaces and adjacent vertebral body endplates are seen at Z0-0 and L4-5 since prior study of 10/13/2017; infectious discitis can not be excluded. IMPRESSION: Autosomal dominant polycystic kidney disease, without significant change since prior study. Several small left renal calculi. No evidence of ureteral calculi or hydronephrosis. New destruction of intervertebral disc spaces and adjacent vertebral endplates at F7-4 and B4-4, suspicious for infectious discitis. Recommend clinical correlation and consider lumbar spine MRI without and with contrast for further evaluation. Electronically Signed   By: Earle Gell M.D.   On: 03/31/2018 20:38    Orson Eva, DO  [facility] Pager [phone]  If 7PM-7AM, please contact night-coverage [email or web address] Password [facility] 04/03/2018, 6:07 PM  LOS: 3 days

## 2018-04-03 NOTE — Progress Notes (Signed)
VAT nurse placed PICC, stated PICC was okay to use and placement was verified by nurse who did insertion, his supervisor, and chest x-ray placement.

## 2018-04-04 LAB — CBC
HCT: 31 % — ABNORMAL LOW (ref 39.0–52.0)
HEMOGLOBIN: 10 g/dL — AB (ref 13.0–17.0)
MCH: 31.9 pg (ref 26.0–34.0)
MCHC: 32.3 g/dL (ref 30.0–36.0)
MCV: 99 fL (ref 80.0–100.0)
NRBC: 0 % (ref 0.0–0.2)
Platelets: 217 10*3/uL (ref 150–400)
RBC: 3.13 MIL/uL — AB (ref 4.22–5.81)
RDW: 14.4 % (ref 11.5–15.5)
WBC: 7.2 10*3/uL (ref 4.0–10.5)

## 2018-04-04 LAB — BASIC METABOLIC PANEL
Anion gap: 10 (ref 5–15)
BUN: 60 mg/dL — ABNORMAL HIGH (ref 8–23)
CHLORIDE: 104 mmol/L (ref 98–111)
CO2: 23 mmol/L (ref 22–32)
Calcium: 8.3 mg/dL — ABNORMAL LOW (ref 8.9–10.3)
Creatinine, Ser: 4.13 mg/dL — ABNORMAL HIGH (ref 0.61–1.24)
GFR calc Af Amer: 17 mL/min — ABNORMAL LOW (ref >60)
GFR calc non Af Amer: 15 mL/min — ABNORMAL LOW (ref >60)
Glucose, Bld: 142 mg/dL — ABNORMAL HIGH (ref 70–99)
Potassium: 3.9 mmol/L (ref 3.5–5.1)
Sodium: 137 mmol/L (ref 135–145)

## 2018-04-04 MED ORDER — VANCOMYCIN HCL IN DEXTROSE 1-5 GM/200ML-% IV SOLN: 1000.0000 mg | INTRAVENOUS | 0 refills | Status: DC

## 2018-04-04 MED ORDER — SODIUM CHLORIDE 0.9 % IV SOLN: 2.0000 g | INTRAVENOUS | 0 refills | Status: DC

## 2018-04-04 MED ORDER — OXYCODONE HCL 5 MG PO TABS
10.0000 mg | ORAL_TABLET | Freq: Four times a day (QID) | ORAL | Status: DC | PRN
  Administered 2018-04-04: 10 mg via ORAL
  Filled 2018-04-04 (×2): qty 2

## 2018-04-04 MED ORDER — HYDRALAZINE HCL 25 MG PO TABS
75.0000 mg | ORAL_TABLET | Freq: Three times a day (TID) | ORAL | Status: DC
  Administered 2018-04-04: 75 mg via ORAL
  Filled 2018-04-04: qty 3

## 2018-04-04 MED ORDER — SODIUM BICARBONATE 650 MG PO TABS: 650.0000 mg | ORAL_TABLET | Freq: Every day | ORAL | 0 refills | Status: DC

## 2018-04-04 MED ORDER — WARFARIN SODIUM 5 MG PO TABS: 5.0000 mg | ORAL_TABLET | Freq: Every day | ORAL | 0 refills | Status: DC

## 2018-04-04 MED ORDER — HYDRALAZINE HCL 25 MG PO TABS: 75.0000 mg | ORAL_TABLET | Freq: Three times a day (TID) | ORAL | 0 refills | Status: DC

## 2018-04-04 MED ORDER — OXYCODONE HCL 10 MG PO TABS: 10.0000 mg | ORAL_TABLET | Freq: Four times a day (QID) | ORAL | 0 refills | Status: DC | PRN

## 2018-04-04 NOTE — Progress Notes (Signed)
Pharmacy Antibiotic Note  Aaron [name] is a 62 y.o. male admitted on 03/31/2018 with osteomyelitis.  Pharmacy has been consulted for Vancomycin dosing. Patient with Lumbar discitis and vertebral osteomyelitis. Per neurosurgery, this patient is not a surgical candidate. PICC line to be placed for extended tx with Vancomycin and ceftriaxone. Currently not on HD, but concerned that he may require. Afebrile, WBC normal. Plan to draw Vancomycin trough level prior to dose on 12/23.  Plan: Continue Vancomycin 1gm IV every 48 hours.  Goal trough 15-20 mcg/mL. Also on rocephin 2gm IV q24h Monitor labs, micro and vitals.   Height: 6\' 4"  (193 cm) Weight: 154 lb 6.4 oz (70 kg) IBW/kg (Calculated) : 86.8  Temp (24hrs), Avg:98.2 F (36.8 C), Min:98.1 F (36.7 C), Max:98.2 F (36.8 C)  Recent Labs  Lab 03/31/18 1825 04/01/18 0507 04/02/18 0522 04/03/18 0629 04/04/18 0623  WBC 9.3 8.4  --  7.0 7.2  CREATININE 5.48* 5.43* 4.80* 4.62* 4.13*    Estimated Creatinine Clearance: 18.6 mL/min (A) (by C-G formula based on SCr of 4.13 mg/dL (H)).    No Known Allergies  Antimicrobials this admission: Vancomcyin 12/19 >>  Rocephin 12/19 >>   Dose adjustments this admission: Estimated Creatinine Clearance: 18.6 mL/min (A) (by C-G formula based on SCr of 4.13 mg/dL (H)).   Microbiology results: 12/19 BCx: ngtd 12/18 UCx: >100CFU/ml,K. Pneum s- cefazolin, unasyn ceftriaxone R- amp  Thank you for allowing pharmacy to be a part of this patient's care.  Isac Sarna, BS Pharm D, BCPS Clinical Pharmacist Pager [phone] 04/04/2018 10:10 AM

## 2018-04-04 NOTE — Clinical Social Work Note (Addendum)
CSW contacted Pamala Hurry at [facility] to inquire if the patient could be admitted, today. Pamala Hurry is going to contact the weekend on-call admissions coordinator to confirm that the insurance authorization was received from [facility]. The CSW will update with information as it is available.  UPDATE: [facility] has not received the [facility] authorization and cannot admit the patient until they have. CSW has updated the attending MD. CSW is following.   UPDATE: [facility] called back to inform the CSW that the authorization was received, and the patient can discharge today. CSW will prepare and print the Med necessity form to the department printer. The RN can call report to [phone]. CSW will send all needed documentation to the facility. At that time, the CSW will sign off. Please consult should needs arise.  Santiago Bumpers, MSW, Latanya Presser [phone]

## 2018-04-04 NOTE — Discharge Summary (Signed)
Physician Discharge Summary  Aaron [name]:[id number] DOB: 22-Feb-1956 DOA: 03/31/2018  PCP: [facility] [place] date: 03/31/2018 Discharge date: 04/04/2018  Admitted From: Home Disposition:  SNF  Recommendations for Outpatient Follow-up/discharge instructions:  1. Vancomycin 1 g IV every 48 hours--next dose 04/05/2018 '@1600'  2. Ceftriaxone 2 g IV daily--next dose 04/05/2018'@1600'  3. Continue vancomycin and ceftriaxone through 05/26/2018 4. Remove PICC line after last doses of antibiotics on 05/26/2018 5. Obtain vancomycin trough level 04/05/2018 '@1500' --please adjust vancomycin dosing per pharmacy protocol based upon trough level 15-20 6. Obtain BMP and CBC every 7 days--first blood draw 04/05/2018 at 1500 7. Obtain ESR, CRP every 7 days--first blood draw 04/05/2018 at 1500 8. Start Coumadin 5 mg daily--obtain INR 04/08/2018--please adjust Coumadin dosing for target INR 2-3 9. Please send ESR, CRP, BMP, CBC, and vancomycin levels to Dr. Flonnie Hailstone 10. Please follow-up with infectious disease, Dr. Della Goo for appointment to follow-up in 3 weeks 11. Follow-up with nephrology, Dr. Ulice Dash Patel--please call for a follow-up appointment in 2 weeks 12. Follow-up with [facility], Dr. Jenny Reichmann Wren--04/16/2018 at 2 PM    Discharge Condition: Stable CODE STATUS: FULL Diet recommendation: Heart Healthy  Brief/Interim Summary: 62 year old with a history of anxiety, osteoarthritis, atrial fibrillation, CAD, chronic back pain,lumbar discitis and paraspinal abscessCOPD, ESRDpreviouslyon hemodialysis, GERD, hepatitis C, essential hypertension, PAD, medical noncompliance came to the ERsecondary to increasing low back pain and intermittent hematuria. The patient patient was most recently admitted from 01/31/2018 through 02/08/2018. During that hospitalization, the patient had respiratory failure secondary to overuse of opioids and hypnotic  medications. The patient was supposed to discharge to [facility] receive intravenous antibiotics for his lumbar discitis and paraspinal abscess. However, at the last minute the patient changed his mind and refused IV antibiotics and SNF placement. The patient was sent home with amox/clav and ciprofor 5 additional weeks. He claims compliance to the antibiotics. He stated that he has had worsening back pain in the past week since he finished antibiotics. He has had some radicular pain down his left leg. Because of his back pain, he has had difficulty ambulating resulting in 3 mechanical falls in the past 2 days. He denies any syncope. He denies any fevers, chills, nausea, vomiting, diarrhea. He states he has had intermittent hematuria for the past week. He had followed up with his outpatient nephrologist who told him to present to the emergency department for further evaluation.  Regarding his recent medical history,the patient was recently admitted to the hospital from 01/26/2018 through 01/29/2018 during which the patient refused dialysis and was subsequently taken off of dialysis by nephrology. His dialysis catheter was removed at that time. The patient has had a long history of noncompliance including leaving AMA from the hospitalization 01/09/2018 through 01/13/2018.  During this hospitalization, the patient was started back on vancomycin and ceftriaxone.  His back pain remained stable.  The patient was started on oxycodone 10 mg every 6 hours as needed pain.  Blood cultures were repeated and were negative.  Right IJ PICC line was placed.  The patient finally agreed to receive 8 weeks of IV antibiotics.  He will require weekly BMP, CBC, ESR, CRP with results faxed to ID, Dr. Flonnie Hailstone.  The patient was also seen by nephrology.  His renal function gradually improved.  He will need to follow-up with Dr. Graylon Gunning for his nephrology needs in 2 to 3 weeks after  discharge.  Discharge Diagnoses:  Lumbar discitis and vertebral osteomyelitis -Suggested  on CT abdomen and pelvis -12/19/2019MRI lumbar spine--L1-2 and L4-5 discitis and osteomyelitis with progressive disc and vertebral collapse. Advanced right foraminal impingement L1-2 -Given the patient's long history of poor compliance, I question whether he truly took his oral antibiotics -Blood cultures x2 sets--unfortunately, this will likely be compromised as the patient received a dose of ceftriaxone 03/31/2018 at2137--blood cultures remained negative -Check ESR--65 -Check CRP--5.6 -12/04/2017 blood cultures--Serratia -8/25/19left upper extremity wound culture--E.faecalis -Case was discussed with neurosurgery, Dr. Melynda Keller nonoperative management;no acute indication for surgery -Requested vascular wellness to see if they are able to place a IJ PICC line -04/03/2018 placed IJ PICC line -Continue vancomycin and ceftriaxone IV x 8 weeks--last dose of antibiotics 05/26/2018 -Case was discussed with ID, Dr. Flonnie Hailstone who agreed with current management.  He wanted the patient to be followed up in the outpatient setting in 3 to 4 weeks -Patient will require weekly CBC, BMP, ESR, CRP and vancomycin troughs, first draw 04/05/2018 at 1500--fax results to ID, Dr. Drucilla Schmidt  Acute on chronic renal failure--CKD stage V -Baseline creatinine 4.2-4.6 -Consult nephrology--appreciated -Continue bicarbonate--decrease to once daily -Concerned that he will need to be initiated on hemodialysis -The patient had previously refused hemodialysis. He was poorly compliant not going to his outpatient dialysis center. -Left arm AV fistula ligated secondary to bleeding 12/2017  Chronic atrial fibrillationwith RVR -Continuediltiazem360 mg daily -Continuemetoprolol to 100 mg bid -01/12/2018 TEE--EF 50-55%, no vegetation, mild MR, HK anterior septal -Holding warfarin secondary to hematuria-->patient  states that he has not taken in any way -Hematuria resolved -Restart Coumadin  Essential hypertension -Continue metoprolol tartrate and diltiazem  Hematuria -May in part be due to the patient's hemorrhagic renal cystsrupturing as well as UTI -monitor Hgb--stable -urology consultappreciated-->follow-up for outpatient cystoscopy -Hemoglobin 10.0 on the day of discharge -Patient has outpatient appointment with urology, Dr. Irine Seal on 04/16/2018 2 PM  UTI--Klebsiella -UA>50 WBC -Continue ceftriaxone  COPD -Stable on room air  Tobacco abuse -I have discussed tobacco cessation with the patient. I have counseled the patient regarding the negative impacts of continued tobacco use including but not limited to lung cancer, COPD, and cardiovascular disease. I have discussed alternatives to tobacco and modalities that may help facilitate tobacco cessation including but not limited to biofeedback, hypnosis, and medications. Total time spent with tobacco counseling was 4 minutes.  Severe malnutrition -Continue supplements   Discharge Instructions   Allergies as of 04/04/2018   No Known Allergies     Medication List    STOP taking these medications   clopidogrel 75 MG tablet Commonly known as:  PLAVIX   oxyCODONE-acetaminophen 10-325 MG tablet Commonly known as:  PERCOCET     TAKE these medications   albuterol 108 (90 Base) MCG/ACT inhaler Commonly known as:  PROVENTIL HFA;VENTOLIN HFA Inhale 2 puffs into the lungs every 6 (six) hours as needed for wheezing or shortness of breath.   BREO ELLIPTA 200-25 MCG/INH Aepb Generic drug:  fluticasone furoate-vilanterol Inhale 1 puff into the lungs every evening.   cefTRIAXone 2 g in sodium chloride 0.9 % 100 mL Inject 2 g into the vein daily. Last dose 05/26/2018   cholecalciferol 1000 units tablet Commonly known as:  VITAMIN D Take 2,000 Units by mouth daily.   diltiazem 360 MG 24 hr capsule Commonly known as:   CARDIZEM CD Take 1 capsule (360 mg total) by mouth daily.   fluticasone 50 MCG/ACT nasal spray Commonly known as:  FLONASE Place 1 spray into both nostrils daily.   furosemide  40 MG tablet Commonly known as:  LASIX Take 1 tablet (40 mg total) by mouth daily.   hydrALAZINE 25 MG tablet Commonly known as:  APRESOLINE Take 3 tablets (75 mg total) by mouth every 8 (eight) hours. What changed:    medication strength  how much to take   metoprolol tartrate 100 MG tablet Commonly known as:  LOPRESSOR Take 1 tablet (100 mg total) by mouth 2 (two) times daily.   Oxycodone HCl 10 MG Tabs Take 1 tablet (10 mg total) by mouth every 6 (six) hours as needed for moderate pain.   pantoprazole 40 MG tablet Commonly known as:  PROTONIX Take 1 tablet (40 mg total) by mouth 2 (two) times daily.   sodium bicarbonate 650 MG tablet Take 1 tablet (650 mg total) by mouth daily.   traZODone 50 MG tablet Commonly known as:  DESYREL Take 50 mg by mouth at bedtime.   vancomycin 1-5 GM/200ML-% Soln Commonly known as:  VANCOCIN Inject 200 mLs (1,000 mg total) into the vein every other day. Last dose on 05/26/2018 Start taking on:  April 05, 2018   warfarin 5 MG tablet Commonly known as:  COUMADIN Take as directed. If you are unsure how to take this medication, talk to your nurse or doctor. Original instructions:  Take 1 tablet (5 mg total) by mouth daily at 6 PM.      Follow-up Information    [facility] [place]. Go on 04/16/2018.   Specialty:  Urology Why:  2pm at the [facility] with Dr. Angelena Form information: [address] [place]       Elmarie Shiley, MD Follow up in 2 week(s).   Specialty:  Nephrology Contact information: [place] [address] [phone]        Tommy Medal, Lavell Islam, MD Follow up in 3 week(s).   Specialty:  Infectious Diseases Contact information: [address] [address] [phone]          No Known Allergies  Consultations:  Nephrology--Dr. Graylon Gunning  Urology--Dr. Jeffie Pollock  Neurosurgery on phone--Dr. Venetia Constable  ID on phone--Dr. Drucilla Schmidt   Procedures/Studies: Dg Chest 1 View  Result Date: 04/03/2018 CLINICAL DATA:  PICC line placement EXAM: CHEST  1 VIEW COMPARISON:  02/02/2018 FINDINGS: Improved aeration at the left base. Minimal basilar atelectasis. Normal heart size. No pneumothorax. Right central venous catheter tip projects over the proximal right atrium. IMPRESSION: Right-sided central venous catheter tip projects over the proximal right atrium. Improved aeration at the left base. Electronically Signed   By: Donavan Foil M.D.   On: 04/03/2018 20:20   Mr Lumbar Spine Wo Contrast  Result Date: 04/01/2018 CLINICAL DATA:  Low back pain radiating down both legs for 2 months. No discitis osteomyelitis with antibiotics completed last week EXAM: MRI LUMBAR SPINE WITHOUT CONTRAST TECHNIQUE: Multiplanar, multisequence MR imaging of the lumbar spine was performed. No intravenous contrast was administered. COMPARISON:  Abdominal CT from yesterday.  Lumbar MRI 01/06/2018 FINDINGS: Segmentation:  5 lumbar type vertebral bodies Alignment:  Reversal of lumbar lordosis.  L5-S1 retrolisthesis Vertebrae: T2 hyperintensity within the disc spaces of L1-2 and L4-5, with marrow edema and endplate destruction that has significantly progressed at L1-2-leading to kyphotic deformity and endplate ridging. Less extensive progression at L4-5. There is still paravertebral inflammation without evident collection. Periarticular edema about the L1-2 and L4-5 facets which may be related to altered weight-bearing as there is no underlying facet joint  destruction to imply facet arthritis. Conus medullaris and cauda equina: Conus extends to the L1 level. Conus and cauda equina appear normal. Paraspinal and other soft tissues: Paravertebral/psoas edema as  noted above. 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 Disc levels: T12- L1: Unremarkable. L1-L2: Infectious disc collapse with endplate ridging. There is moderate spinal stenosis. Advanced right foraminal stenosis with L1 compression. L2-L3: Remote PLIF.  No solid arthrodesis by CT.  No impingement L3-L4: Remote PLIF.  No solid arthrodesis by CT.  No impingement L4-L5: Discitis osteomyelitis. Degenerative facet spurring and degenerative ridging. No impingement L5-S1:Disc narrowing and bulging with retrolisthesis. Degenerative facet spurring. Moderate right foraminal stenosis. IMPRESSION: 1. 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 Degree of residual marrow and paravertebral edema is compatible with ongoing inflammation/infection. There is advanced right foraminal impingement at L1-2 from disc collapse. No evident collection. 2. Chronic findings described above. Electronically Signed   By: Monte Fantasia M.D.   On: 04/01/2018 09:03   Ct Renal Stone Study  Result Date: 03/31/2018 CLINICAL DATA:  Flank pain for 2 months. Hematuria for several days. Suspect stone disease. EXAM: CT ABDOMEN AND PELVIS WITHOUT CONTRAST TECHNIQUE: Multidetector CT imaging of the abdomen and pelvis was performed following the standard protocol without IV contrast. COMPARISON:  10/13/2017 FINDINGS: Lower chest: No acute findings. Hepatobiliary: No mass visualized on this unenhanced exam. Several small fluid attenuation hepatic cysts again noted. Gallbladder is unremarkable. Pancreas: No mass or inflammatory process visualized on this unenhanced exam. Spleen:  Within normal limits in size. Adrenals/Urinary tract: Normal adrenal glands. Marked symmetric enlargement of both kidneys is seen with diffuse involvement by innumerable water attenuation and hemorrhagic renal cysts, which are not significantly changed compared to prior study. These findings  are consistent with autosomal dominant polycystic kidney disease. Several small less than 1 cm left renal calculi are seen. No evidence of ureteral calculi or hydronephrosis. Unremarkable unopacified urinary bladder. Stomach/Bowel: No evidence of obstruction, inflammatory process, or abnormal fluid collections. Diverticulosis is seen mainly involving the descending and sigmoid colon, however there is no evidence of diverticulitis. Vascular/Lymphatic: No pathologically enlarged lymph nodes identified. No evidence of abdominal aortic aneurysm. Aortic atherosclerosis. A stent again seen in the right external iliac artery. Reproductive:  No mass or other significant abnormality. Other:  None. Musculoskeletal: Previous interbody fusions again seen at L2-3 and L3-4. New destruction of intervertebral disc spaces and adjacent vertebral body endplates are seen at P5-3 and L4-5 since prior study of 10/13/2017; infectious discitis can not be excluded. IMPRESSION: Autosomal dominant polycystic kidney disease, without significant change since prior study. Several small left renal calculi. No evidence of ureteral calculi or hydronephrosis. New destruction of intervertebral disc spaces and adjacent vertebral endplates at I1-4 and E3-1, suspicious for infectious discitis. Recommend clinical correlation and consider lumbar spine MRI without and with contrast for further evaluation. Electronically Signed   By: Earle Gell M.D.   On: 03/31/2018 20:38        Discharge Exam: Vitals:   04/03/18 2150 04/04/18 0526  BP: (!) 150/97 (!) 145/101  Pulse: 75 64  Resp: 17 18  Temp: 98.2 F (36.8 C) 98.1 F (36.7 C)  SpO2: 95% 99%   Vitals:   04/03/18 0854 04/03/18 1440 04/03/18 2150 04/04/18 0526  BP:  (!) 157/96 (!) 150/97 (!) 145/101  Pulse:  75 75 64  Resp:  '18 17 18  ' Temp:  98.2 F (36.8 C) 98.2 F (36.8 C)  98.1 F (36.7 C)  TempSrc:  Oral Oral Oral  SpO2: 97% 97% 95% 99%  Weight:      Height:        General:  Pt is alert, awake, not in acute distress Cardiovascular: IRRR, S1/S2 +, no rubs, no gallops Respiratory: Bibasilar crackles but no wheezing.  Good air movement Abdominal: Soft, NT, ND, bowel sounds + Extremities: no edema, no cyanosis   The results of significant diagnostics from this hospitalization (including imaging, microbiology, ancillary and laboratory) are listed below for reference.    Significant Diagnostic Studies: Dg Chest 1 View  Result Date: 04/03/2018 CLINICAL DATA:  PICC line placement EXAM: CHEST  1 VIEW COMPARISON:  02/02/2018 FINDINGS: Improved aeration at the left base. Minimal basilar atelectasis. Normal heart size. No pneumothorax. Right central venous catheter tip projects over the proximal right atrium. IMPRESSION: Right-sided central venous catheter tip projects over the proximal right atrium. Improved aeration at the left base. Electronically Signed   By: Donavan Foil M.D.   On: 04/03/2018 20:20   Mr Lumbar Spine Wo Contrast  Result Date: 04/01/2018 CLINICAL DATA:  Low back pain radiating down both legs for 2 months. No discitis osteomyelitis with antibiotics completed last week EXAM: MRI LUMBAR SPINE WITHOUT CONTRAST TECHNIQUE: Multiplanar, multisequence MR imaging of the lumbar spine was performed. No intravenous contrast was administered. COMPARISON:  Abdominal CT from yesterday.  Lumbar MRI 01/06/2018 FINDINGS: Segmentation:  5 lumbar type vertebral bodies Alignment:  Reversal of lumbar lordosis.  L5-S1 retrolisthesis Vertebrae: T2 hyperintensity within the disc spaces of L1-2 and L4-5, with marrow edema and endplate destruction that has significantly progressed at L1-2-leading to kyphotic deformity and endplate ridging. Less extensive progression at L4-5. There is still paravertebral inflammation without evident collection. Periarticular edema about the L1-2 and L4-5 facets which may be related to altered weight-bearing as there is no underlying facet joint  destruction to imply facet arthritis. Conus medullaris and cauda equina: Conus extends to the L1 level. Conus and cauda equina appear normal. Paraspinal and other soft tissues: Paravertebral/psoas edema as noted above. 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 Disc levels: T12- L1: Unremarkable. L1-L2: Infectious disc collapse with endplate ridging. There is moderate spinal stenosis. Advanced right foraminal stenosis with L1 compression. L2-L3: Remote PLIF.  No solid arthrodesis by CT.  No impingement L3-L4: Remote PLIF.  No solid arthrodesis by CT.  No impingement L4-L5: Discitis osteomyelitis. Degenerative facet spurring and degenerative ridging. No impingement L5-S1:Disc narrowing and bulging with retrolisthesis. Degenerative facet spurring. Moderate right foraminal stenosis. IMPRESSION: 1. 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 Degree of residual marrow and paravertebral edema is compatible with ongoing inflammation/infection. There is advanced right foraminal impingement at L1-2 from disc collapse. No evident collection. 2. Chronic findings described above. Electronically Signed   By: Monte Fantasia M.D.   On: 04/01/2018 09:03   Ct Renal Stone Study  Result Date: 03/31/2018 CLINICAL DATA:  Flank pain for 2 months. Hematuria for several days. Suspect stone disease. EXAM: CT ABDOMEN AND PELVIS WITHOUT CONTRAST TECHNIQUE: Multidetector CT imaging of the abdomen and pelvis was performed following the standard protocol without IV contrast. COMPARISON:  10/13/2017 FINDINGS: Lower chest: No acute findings. Hepatobiliary: No mass visualized on this unenhanced exam. Several small fluid attenuation hepatic cysts again noted. Gallbladder is unremarkable. Pancreas: No mass or inflammatory process visualized on this unenhanced exam. Spleen:  Within normal limits in size. Adrenals/Urinary tract: Normal adrenal glands.  Marked  symmetric enlargement of both kidneys is seen with diffuse involvement by innumerable water attenuation and hemorrhagic renal cysts, which are not significantly changed compared to prior study. These findings are consistent with autosomal dominant polycystic kidney disease. Several small less than 1 cm left renal calculi are seen. No evidence of ureteral calculi or hydronephrosis. Unremarkable unopacified urinary bladder. Stomach/Bowel: No evidence of obstruction, inflammatory process, or abnormal fluid collections. Diverticulosis is seen mainly involving the descending and sigmoid colon, however there is no evidence of diverticulitis. Vascular/Lymphatic: No pathologically enlarged lymph nodes identified. No evidence of abdominal aortic aneurysm. Aortic atherosclerosis. A stent again seen in the right external iliac artery. Reproductive:  No mass or other significant abnormality. Other:  None. Musculoskeletal: Previous interbody fusions again seen at L2-3 and L3-4. New destruction of intervertebral disc spaces and adjacent vertebral body endplates are seen at T5-1 and L4-5 since prior study of 10/13/2017; infectious discitis can not be excluded. IMPRESSION: Autosomal dominant polycystic kidney disease, without significant change since prior study. Several small left renal calculi. No evidence of ureteral calculi or hydronephrosis. New destruction of intervertebral disc spaces and adjacent vertebral endplates at V6-1 and Y0-7, suspicious for infectious discitis. Recommend clinical correlation and consider lumbar spine MRI without and with contrast for further evaluation. Electronically Signed   By: Earle Gell M.D.   On: 03/31/2018 20:38     Microbiology: Recent Results (from the past 240 hour(s))  Urine C&S     Status: Abnormal   Collection Time: 03/31/18  6:01 PM  Result Value Ref Range Status   Specimen Description   Final    URINE, CLEAN CATCH Performed at [facility], [address] [place],  [place] [address]    Special Requests   Final    NONE Performed at [facility], [address] [place],  [address]    Culture >=100,000 COLONIES/mL KLEBSIELLA PNEUMONIAE (A)  Final   Report Status 04/03/2018 FINAL  Final   Organism ID, Bacteria KLEBSIELLA PNEUMONIAE (A)  Final      Susceptibility   Klebsiella pneumoniae - MIC*    AMPICILLIN RESISTANT Resistant     CEFAZOLIN <=4 SENSITIVE Sensitive     CEFTRIAXONE <=1 SENSITIVE Sensitive     CIPROFLOXACIN <=0.25 SENSITIVE Sensitive     GENTAMICIN <=1 SENSITIVE Sensitive     IMIPENEM <=0.25 SENSITIVE Sensitive     NITROFURANTOIN 64 INTERMEDIATE Intermediate     TRIMETH/SULFA <=20 SENSITIVE Sensitive     AMPICILLIN/SULBACTAM 4 SENSITIVE Sensitive     PIP/TAZO <=4 SENSITIVE Sensitive     Extended ESBL NEGATIVE Sensitive     * >=100,000 COLONIES/mL KLEBSIELLA PNEUMONIAE  Culture, blood (Routine X 2) w Reflex to ID Panel     Status: None (Preliminary result)   Collection Time: 04/01/18  7:20 AM  Result Value Ref Range Status   Specimen Description BLOOD RIGHT HAND  Final   Special Requests   Final    BOTTLES DRAWN AEROBIC AND ANAEROBIC Blood Culture adequate volume   Culture   Final    NO GROWTH 3 DAYS Performed at [facility], [address], [place],  [address]    Report Status PENDING  Incomplete  Culture, blood (Routine X 2) w Reflex to ID Panel     Status: None (Preliminary result)   Collection Time: 04/01/18  7:39 AM  Result Value Ref Range Status   Specimen Description BLOOD RIGHT ARM UPPER  Final   Special Requests   Final    BOTTLES  DRAWN AEROBIC AND ANAEROBIC Blood Culture adequate volume   Culture   Final    NO GROWTH 3 DAYS Performed at [facility], [address] [place], [place] [address]    Report Status PENDING  Incomplete     Labs: Basic Metabolic Panel: Recent Labs  Lab 03/31/18 1825 04/01/18 0507 04/02/18 0522 04/03/18 0629 04/04/18 0623  NA 136 135 137 134* 137  K 4.0 4.0 3.7 3.8 3.9  CL  106 107 106 105 104  CO2 19* 16* 21* 21* 23  GLUCOSE 96 81 101* 111* 142*  BUN 53* 53* 53* 55* 60*  CREATININE 5.48* 5.43* 4.80* 4.62* 4.13*  CALCIUM 8.5* 8.3* 8.1* 8.1* 8.3*   Liver Function Tests: Recent Labs  Lab 03/31/18 1825  AST 10*  ALT 10  ALKPHOS 106  BILITOT 0.7  PROT 7.0  ALBUMIN 3.5   No results for input(s): LIPASE, AMYLASE in the last 168 hours. No results for input(s): AMMONIA in the last 168 hours. CBC: Recent Labs  Lab 03/31/18 1825 04/01/18 0507 04/03/18 0629 04/04/18 0623  WBC 9.3 8.4 7.0 7.2  NEUTROABS 7.4  --   --   --   HGB 12.1* 11.1* 10.3* 10.0*  HCT 38.1* 34.4* 32.4* 31.0*  MCV 97.7 98.3 96.7 99.0  PLT 259 244 229 217   Cardiac Enzymes: No results for input(s): CKTOTAL, CKMB, CKMBINDEX, TROPONINI in the last 168 hours. BNP: Invalid input(s): POCBNP CBG: No results for input(s): GLUCAP in the last 168 hours.  Time coordinating discharge:  36 minutes  Signed:  Orson Eva, DO [facility] Pager: [phone] 04/04/2018, 10:13 AM

## 2018-04-04 NOTE — Plan of Care (Signed)
Report called to Lacretia Nicks, LPN at [facility]. All questions answered.

## 2018-04-06 LAB — CULTURE, BLOOD (ROUTINE X 2)
Culture: NO GROWTH
Culture: NO GROWTH
Special Requests: ADEQUATE
Special Requests: ADEQUATE

## 2018-04-12 LAB — PROTIME-INR

## 2018-04-22 ENCOUNTER — Telehealth: Payer: Self-pay | Admitting: Behavioral Health

## 2018-04-22 NOTE — Telephone Encounter (Signed)
Ok very good thanks! 

## 2018-04-22 NOTE — Telephone Encounter (Signed)
Herminio Commons called from [place] care stating patient has been refusing to administer IV antibiotics at home Vancomycin Q72 hours and Rocephin 2 grams daily.  She has set him up with daily infusions at [facility] day surgery to get his  Infusions daily.  Stanton Kidney just wanted Dr. Tommy Medal to be aware.  She has his end date May 26, 2018. Pricilla Riffle RN

## 2018-04-29 ENCOUNTER — Inpatient Hospital Stay: Payer: 59 | Admitting: Internal Medicine

## 2018-04-29 ENCOUNTER — Inpatient Hospital Stay: Payer: 59 | Admitting: Infectious Disease

## 2018-05-11 ENCOUNTER — Inpatient Hospital Stay: Payer: 59 | Admitting: Infectious Disease

## 2018-05-27 ENCOUNTER — Ambulatory Visit
Admission: RE | Admit: 2018-05-27 | Discharge: 2018-05-27 | Disposition: A | Discharge status: Home / Self Care | Payer: 59 | Source: Ambulatory Visit | Attending: Infectious Diseases | Admitting: Infectious Diseases

## 2018-05-27 ENCOUNTER — Encounter: Payer: Self-pay | Admitting: Infectious Diseases

## 2018-05-27 ENCOUNTER — Ambulatory Visit (INDEPENDENT_AMBULATORY_CARE_PROVIDER_SITE_OTHER): Payer: 59 | Admitting: Infectious Diseases

## 2018-05-27 ENCOUNTER — Other Ambulatory Visit: Payer: Self-pay | Admitting: Radiology

## 2018-05-27 VITALS — BP 144/85 | HR 55 | Temp 97.8°F

## 2018-05-27 DIAGNOSIS — M4646 Discitis, unspecified, lumbar region: Secondary | ICD-10-CM | POA: Diagnosis not present

## 2018-05-27 DIAGNOSIS — Z8619 Personal history of other infectious and parasitic diseases: Secondary | ICD-10-CM

## 2018-05-27 DIAGNOSIS — Z992 Dependence on renal dialysis: Secondary | ICD-10-CM

## 2018-05-27 DIAGNOSIS — M4620 Osteomyelitis of vertebra, site unspecified: Secondary | ICD-10-CM | POA: Diagnosis not present

## 2018-05-27 DIAGNOSIS — Z452 Encounter for adjustment and management of vascular access device: Secondary | ICD-10-CM | POA: Diagnosis not present

## 2018-05-27 DIAGNOSIS — R7881 Bacteremia: Secondary | ICD-10-CM

## 2018-05-27 DIAGNOSIS — N186 End stage renal disease: Secondary | ICD-10-CM

## 2018-05-27 NOTE — Progress Notes (Signed)
Patient: Aaron [name]  DOB: 07-25-1955 MRN: [id number] PCP: Gwenlyn Saran Hosmer   Patient Active Problem List   Diagnosis Date Noted  . Vertebral osteomyelitis ([place]) 04/02/2018  . Back pain 03/31/2018  . Noncompliance with medication regimen 03/31/2018  . CKD (chronic kidney disease) stage 5, GFR less than 15 ml/min (HCC) 02/03/2018  . COPD with acute exacerbation ([place]) 02/03/2018  . Atrial fibrillation, chronic 02/03/2018  . Discitis 01/27/2018  . Discitis of lumbar region 01/09/2018  . Paraspinal abscess ([place]) 01/08/2018  . ESRD on dialysis ([place]) 12/30/2017  . Hematoma of arm, right, initial encounter 11/23/2017  . Protein-calorie malnutrition, severe 10/15/2017  . Hyperlipidemia 08/24/2017  . Schatzki's ring of distal esophagus 04/27/2017  . Encounter for therapeutic drug monitoring 03/03/2017  . History of hepatitis C, naturally cleared  02/20/2017  . Ischemic cardiomyopathy 01/27/2017  . Dyslipidemia 01/27/2017  . AF (paroxysmal atrial fibrillation) ([place]) 01/27/2017  . CAD (coronary artery disease) 01/21/2017  . Polycystic kidney disease 12/14/2016  . COPD (chronic obstructive pulmonary disease) ([place])   . COLD (chronic obstructive lung disease) ([place])   . Depression   . PAD (peripheral artery disease) ([place]) 09/19/2014  . Preop cardiovascular exam 10/28/2010  . Tobacco abuse 10/28/2010  . UNSPECIFIED IRON DEFICIENCY ANEMIA 10/04/2009  . 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  . Essential hypertension 10/04/2009  . Esophageal reflux 10/04/2009  . PRECORDIAL PAIN 10/04/2009     Subjective:  Aaron [name] is a 63 y.o. male here for hospitalization follow up.   He was first seen by ID team at the end of August 2019 when he had a left arm fistula wound infection that was complicated by serratia/enterococcus faecalis bacteremia. He at this time underwent I&D and evacuated hematoma and excised friable/diseased portions of the fistula. He was given  ceftriaxone inpatient then transitioned to ceftaz with HD with intended course 3 weeks.  He returned 6mlater at the end of September after he fell and injured his back; imaging revealed discitis with paraspinal abscess. Underwent aspiration on Sept 29 that revealed bacillus. TEE was negative for IE. He was given Ceftriaxone + Vancomycin after HD with intentions to continue and stop on November 19th. He left AMA on 01/13/18.  ID was re-consulted again 10/16 after he presented about 10 days later with somnolence and ongoing back pain. At this time his HD catheter was removed as there was discusion he did not need HD any longer; it was felt he was not a good candidate to receive IV therapy and given oral ciprofloxacin + Augmentin adjusted for renal function in January 28, 2018 discharged home.   After this hospital discharge it is unclear as to me what has gone on with his antibiotics and treatments since that point. [facility] to SNF for IV antibiotics and PT however changed his mind and requested discharge to home; it is unclear if ID was consulted during this hospital stay and was recommended an additional 5 weeks more at discharge (10/28 with new antibiotic stop date 12/2) after receiving Ceftriaxone BID and vancomycin while inpatient (ESR 119, CRP 4).   Presented again to hospital in December for back pain and discitis. MRI revealed L1-2 discitis/osteomyelitis with progressive disc and vertebral collapse and advanced right foraminal impingement at L1-2. Blood cultures were repeated and negative. R IJ PICC line was tunneled at this time and was agreeable to receive 8 weeks of IV  antibiotics at that time - case was discussed with Dr. Tommy Medal via telephone and advised antibiotics through 05/26/18.  He unfortunately did not show up for his appointments with Dr. Tommy Medal; there is a phone note that reports him not allowing [facility] to  administer home IV abx per order (Vanc Q72h, CTX 2g QD) - infusions were set up to be received at [facility] day surgery to continue through February 12.   Mr Demirjian is here today for follow up with his daughter. He is coming from a SNF from their report and is in a wheelchair today. He reports that his back pain is significantly improved but he still has weakness to lower extremities and needs rolling walker. His daughter is concerned about him returning home as he will almost certainly fall. The patient is requesting I call Dr. Trula Slade to get an appointment to see him for evaluation of the R femoral graft site from previous revascularization surgery. There is no pain or swelling or tenderness at this site but feels that it is the reason for his weakness. No fevers/chills.   Review of Systems  All other systems reviewed and are negative.   Past Medical History:  Diagnosis Date  . Anxiety   . Arthritis    knees , back , shoulders (10/12/2017)  . Atrial fibrillation ([place])   . CAD (coronary artery disease)    Mild nonobstructive disease at cardiac catheterization 2002  . Chronic back pain    "back of my neck; down thru my legs" (10/12/2017)  . COPD (chronic obstructive pulmonary disease) ([place])   . Diverticulitis   . Esophageal reflux   . ESRD (end stage renal disease) on dialysis [facility])    "TTS; [place]" (11/23/2017)  . Essential hypertension   . Hepatitis C    states he no longer has this  . History of kidney stones   . History of syncope   . Hyperlipidemia   . Jerking 09/23/2014  . Myocardial infarction ([place]) 02/2017   "light one" (10/12/2017)  . Non-compliant behavior    non compliant with diaylsis per daughter  . PAT (paroxysmal atrial tachycardia) ([place])   . Peripheral arterial disease ([place])    Occluded left superficial femoral artery status post stent June 2016 - Dr. Trula Slade  . Pneumonia 1961  . Polycystic kidney, unspecified type   . Syncope 09/2014  . SYNCOPE 05/07/2010   Qualifier:  Diagnosis of  By: Laurance Flatten RN, BSN, Jennifer      Outpatient Medications Prior to Visit  Medication Sig Dispense Refill  . albuterol (PROVENTIL HFA;VENTOLIN HFA) 108 (90 Base) MCG/ACT inhaler Inhale 2 puffs into the lungs every 6 (six) hours as needed for wheezing or shortness of breath.    Marland Kitchen BREO ELLIPTA 200-25 MCG/INH AEPB Inhale 1 puff into the lungs every evening.     . cefTRIAXone 2 g in sodium chloride 0.9 % 100 mL Inject 2 g into the vein daily. Last dose 05/26/2018 52 vial 0  . cholecalciferol (VITAMIN D) 1000 units tablet Take 2,000 Units by mouth daily.    Marland Kitchen diltiazem (CARDIZEM CD) 360 MG 24 hr capsule Take 1 capsule (360 mg total) by mouth daily. 30 capsule 1  . fluticasone (FLONASE) 50 MCG/ACT nasal spray Place 1 spray into both nostrils daily.    . furosemide (LASIX) 40 MG tablet Take 1 tablet (40 mg total) by mouth daily. 30 tablet 1  . hydrALAZINE (APRESOLINE) 25 MG tablet Take 3 tablets (75 mg total) by mouth  every 8 (eight) hours. 180 tablet 0  . metoprolol tartrate (LOPRESSOR) 100 MG tablet Take 1 tablet (100 mg total) by mouth 2 (two) times daily. 60 tablet 1  . oxyCODONE 10 MG TABS Take 1 tablet (10 mg total) by mouth every 6 (six) hours as needed for moderate pain. 12 tablet 0  . pantoprazole (PROTONIX) 40 MG tablet Take 1 tablet (40 mg total) by mouth 2 (two) times daily. 60 tablet 1  . sodium bicarbonate 650 MG tablet Take 1 tablet (650 mg total) by mouth daily. 30 tablet 0  . traZODone (DESYREL) 50 MG tablet Take 50 mg by mouth at bedtime.    . vancomycin (VANCOCIN) 1-5 GM/200ML-% SOLN Inject 200 mLs (1,000 mg total) into the vein every other day. Last dose on 05/26/2018 4000 mL 0  . warfarin (COUMADIN) 5 MG tablet Take 1 tablet (5 mg total) by mouth daily at 6 PM. 30 tablet 0   No facility-administered medications prior to visit.      No Known Allergies  Social History   Tobacco Use  . Smoking status: Current Every Day Smoker    Packs/day: 2.00    Years: 47.00     Pack years: 94.00    Types: Cigarettes    Last attempt to quit: 08/03/2017    Years since quitting: 0.8  . Smokeless tobacco: Never Used  Substance Use Topics  . Alcohol use: Not Currently    Comment: 10/12/2017 alcohol free since 2017,  heavy drinker in the past  . Drug use: Not Currently    Comment: "<2003 whatever was around; nothing since"    Family History  Problem Relation Age of Onset  . Alcoholism Mother   . Heart disease Father        Massive heart attack  . Heart attack Father   . Atrial fibrillation Father   . Colon cancer Father   . Colon cancer Maternal Grandfather 64  . Alcoholism Maternal Grandfather   . Renal cancer Cousin   . Ovarian cancer Sister     Objective:   Vitals:   05/27/18 1119  BP: (!) 144/85  Pulse: (!) 55  Temp: 97.8 F (36.6 C)  TempSrc: Oral  SpO2: 97%   There is no height or weight on file to calculate BMI.  Physical Exam Constitutional:      Comments: Chronically ill-appearing male, older than stated age. Non-toxic appearing.   HENT:     Mouth/Throat:     Mouth: Mucous membranes are moist.     Pharynx: Oropharynx is clear.  Eyes:     General: No scleral icterus.    Pupils: Pupils are equal, round, and reactive to light.  Neck:     Musculoskeletal: Normal range of motion. No neck rigidity.  Cardiovascular:     Rate and Rhythm: Normal rate and regular rhythm.     Pulses: Normal pulses.     Heart sounds: Normal heart sounds. No murmur.  Pulmonary:     Effort: Pulmonary effort is normal.  Abdominal:     General: Bowel sounds are normal. There is no distension.  Musculoskeletal:        General: No swelling or deformity.     Comments: RLE weakness present. Hip raise to gravity without difficulty but weak against resistance. No spinal tenderness (boney or muscular) on exam. Kyphotic defect.   Skin:    General: Skin is warm and dry.     Capillary Refill: Capillary refill takes less than 2 seconds.  Comments: R groin surgical  incision well healed, no discharge, erythema or pain. There is a palpable pulse that is strong. No bruit. No signs of infection.   Neurological:     General: No focal deficit present.     Mental Status: He is oriented to person, place, and time.   Right Chest IJ Tunneled Catheter - clean/dry intact. Sutures in place. Site unremarkable.   Lab Results: Lab Results  Component Value Date   WBC 7.2 04/04/2018   HGB 10.0 (L) 04/04/2018   HCT 31.0 (L) 04/04/2018   MCV 99.0 04/04/2018   PLT 217 04/04/2018    Lab Results  Component Value Date   CREATININE 4.13 (H) 04/04/2018   BUN 60 (H) 04/04/2018   NA 137 04/04/2018   K 3.9 04/04/2018   CL 104 04/04/2018   CO2 23 04/04/2018    Lab Results  Component Value Date   ALT 10 03/31/2018   AST 10 (L) 03/31/2018   ALKPHOS 106 03/31/2018   BILITOT 0.7 03/31/2018     Assessment & Plan:   Problem List Items Addressed This Visit      Unprioritized   RESOLVED: Bacteremia    No evidence of relapsing bacteremia on exam today. Stop antibiotics and follow. Counseled on return precautions to ER.       Discitis of lumbar region   Relevant Orders   DG Lumbar Spine Complete (Completed)   ESRD on dialysis ([place])    No longer requiring HD - follows with nephrology.       History of hepatitis C, naturally cleared     Hep C RNA negative in 2018. Hep B cAb / sAb (+) indicating previously cleared hepatitis b infection. No intervention needed at this time.       Vertebral osteomyelitis (HCC)    Back pain is significantly improved per patients report. There is no bony or paraspinal tenderness on physical exam. Slight kyphotic defect. His MRI in December showed no further paravertebral abscess, L1-2 and L4-5 discitis osteomyelitis with progressive disc and vertebral collapse at L1-2.  I have no labs available for review - will attempt to collect information from [place]. He has had several attempts of interrupted therapy with  oral regimens and IV in the past amongst his many hospitalizations last year.  Will stop antibiotics at this time and observe. Check X-ray of L-spine to begin re-evaluation of weakness and follow vertebral discitis.  May require re-evaluation with neurosurgery for consideration of repeat imaging to guide possible surgical intervention.        Other Visit Diagnoses    Encounter for removal of peripherally inserted central catheter (PICC)    -  Primary     Will have him return in 4w off antibiotics to follow.   Janene Madeira, MSN, NP-C [facility] [place] Pager: [phone] Office: [phone]  06/02/18  10:04 PM

## 2018-05-27 NOTE — Patient Instructions (Addendum)
Dr Stephens Shire office is Office: [phone]  Call to make an appointment to see him - I don't see any signs of infection of your graft today, fortunately.   We will stop all antibiotics for now  I want to check x-ray of your back to evaluate the bones with your weakness.

## 2018-05-28 ENCOUNTER — Encounter (HOSPITAL_COMMUNITY): Payer: Self-pay | Admitting: Radiology

## 2018-05-28 ENCOUNTER — Ambulatory Visit (HOSPITAL_COMMUNITY)
Admission: RE | Admit: 2018-05-28 | Discharge: 2018-05-28 | Disposition: A | Discharge status: Home / Self Care | Payer: 59 | Source: Ambulatory Visit | Attending: Infectious Diseases | Admitting: Infectious Diseases

## 2018-05-28 ENCOUNTER — Telehealth: Payer: Self-pay

## 2018-05-28 ENCOUNTER — Other Ambulatory Visit: Payer: Self-pay | Admitting: Infectious Diseases

## 2018-05-28 DIAGNOSIS — Z452 Encounter for adjustment and management of vascular access device: Secondary | ICD-10-CM

## 2018-05-28 DIAGNOSIS — I482 Chronic atrial fibrillation, unspecified: Secondary | ICD-10-CM

## 2018-05-28 HISTORY — PX: IR REMOVAL TUN CV CATH W/O FL: IMG2289

## 2018-05-28 MED ORDER — SODIUM CHLORIDE 0.9 % IV SOLN: INTRAVENOUS | Status: DC

## 2018-05-28 MED ORDER — CEFAZOLIN SODIUM-DEXTROSE 2-4 GM/100ML-% IV SOLN: 2.0000 g | INTRAVENOUS | Status: DC

## 2018-05-28 NOTE — Progress Notes (Signed)
Procedure cancelled, pt does not have port, has PICC.  )Jannifer Franklin PA verified and PICC discontinued by Pam at bedside.  No complications, dressing applied. ,No complaints from pt.

## 2018-05-28 NOTE — Telephone Encounter (Signed)
Called pt to notify overdue coumadin pt compliant to come in  2/24

## 2018-05-28 NOTE — Progress Notes (Addendum)
Patient ID: Aaron [name], male   DOB: March 29, 1956, 63 y.o.   MRN: [id number]  Pt was scheduled today for [facility] a cath removal Upon examination pt actually has Rt IJ non tunneled central line in place Has used since 12/21 for antibiotics for discitis   12/21 note [facility]: VAT nurse placed PICC, stated PICC was okay to use and placement was verified by nurse who did insertion, his supervisor, and chest x-ray placement.    04/22/18 note: Herminio Commons called from [place] care stating patient has been refusing to administer IV antibiotics at home Vancomycin Q72 hours and Rocephin 2 grams daily.  She has set him up with daily infusions at [facility] day surgery to get his  Infusions daily.  Stanton Kidney just wanted Dr. Tommy Medal to be aware.  She has his end date May 26, 2018.    Was seen in ID yesterday per pt IR does have order to remove line today per Dr Janene Madeira  II have removed Rt IJ Non tunneled Central catheter at bedside 23 cm line removed in its entirety Pressure held Hemostasis achieved Dressing placed  DC home

## 2018-06-02 ENCOUNTER — Encounter: Payer: Self-pay | Admitting: Infectious Diseases

## 2018-06-02 NOTE — Assessment & Plan Note (Signed)
No longer requiring HD - follows with nephrology.

## 2018-06-02 NOTE — Assessment & Plan Note (Signed)
No evidence of relapsing bacteremia on exam today. Stop antibiotics and follow. Counseled on return precautions to ER.

## 2018-06-02 NOTE — Assessment & Plan Note (Addendum)
Back pain is significantly improved per patients report. There is no bony or paraspinal tenderness on physical exam. Slight kyphotic defect. His MRI in December showed no further paravertebral abscess, L1-2 and L4-5 discitis osteomyelitis with progressive disc and vertebral collapse at L1-2.  I have no labs available for review - will attempt to collect information from [place]. He has had several attempts of interrupted therapy with oral regimens and IV in the past amongst his many hospitalizations last year.  Will stop antibiotics at this time and observe. Check X-ray of L-spine to begin re-evaluation of weakness and follow vertebral discitis.  May require re-evaluation with neurosurgery for consideration of repeat imaging to guide possible surgical intervention.

## 2018-06-02 NOTE — Assessment & Plan Note (Signed)
Hep C RNA negative in 2018. Hep B cAb / sAb (+) indicating previously cleared hepatitis b infection. No intervention needed at this time.

## 2018-06-03 NOTE — Telephone Encounter (Signed)
Attempted to get lab work for Aaron. Aaron [name].  Called [facility] and they state they discharged Aaron [name] in January and were not abke tell me which [place] he was working with.    Called patient keft him a message to call the office back.  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  She states her father has been admitted to ICU at [facility].   Will continue  to find recent lab results.  Pricilla Riffle RN

## 2018-06-10 ENCOUNTER — Telehealth: Payer: Self-pay | Admitting: *Deleted

## 2018-06-10 NOTE — Telephone Encounter (Signed)
Phone call from [place]. Requesting appointment to check right groin. Patient c/o knot at groin. Nicole palpated "several pea sized knots" . She could not confirm if this felt like scar tissue or not. She stated patient would not remove clothes for her to check. Appt time give for 06/14/2018. She states she will notify patient.

## 2018-06-14 ENCOUNTER — Ambulatory Visit (INDEPENDENT_AMBULATORY_CARE_PROVIDER_SITE_OTHER): Payer: 59 | Admitting: Physician Assistant

## 2018-06-14 ENCOUNTER — Other Ambulatory Visit: Payer: Self-pay

## 2018-06-14 ENCOUNTER — Encounter: Payer: Self-pay | Admitting: Family

## 2018-06-14 VITALS — BP 150/85 | HR 61 | Temp 98.4°F | Resp 16 | Ht 75.0 in | Wt 208.0 lb

## 2018-06-14 DIAGNOSIS — N185 Chronic kidney disease, stage 5: Secondary | ICD-10-CM | POA: Diagnosis not present

## 2018-06-14 DIAGNOSIS — I739 Peripheral vascular disease, unspecified: Secondary | ICD-10-CM

## 2018-06-14 MED ORDER — OXYCODONE-ACETAMINOPHEN 5-325 MG PO TABS: 1.0000 | ORAL_TABLET | Freq: Four times a day (QID) | ORAL | 0 refills | Status: DC | PRN

## 2018-06-14 NOTE — Progress Notes (Signed)
Established Previous Bypass   History of Present Illness   Aaron [name] is a 63 y.o. (27-Jul-1955) male who presents with pain in right groin as well as left arm and hand.  Surgical history significant for left SFA atherectomy as well as for right iliac and SFA stenting with CFA endarterectomy and subsequent debridement x2 due to nonhealing and fluid collections in right groin.  Surgical history also significant for left transposed basilic vein fistula with subsequent ligation and evacuation of hematoma in left axilla.  Patient complains of persistent right groin pain that is affecting his day-to-day life and impacting his mobility.  At rest this does not bother him however when he begins to walk he has a sharp shooting pain and a stretching feeling around his right groin incision which radiates down his thigh.  He also describes occasional pain and numbness into his left forearm and hand since surgery for second stage basilic vein transposition.  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  He also informs me that he no longer requires hemodialysis and right IJ tunneled dialysis catheter has been removed.  He is following with Dr. Lowanda Foster every 3 months.  He is on Coumadin.    Current Outpatient Medications  Medication Sig Dispense Refill  . albuterol (PROVENTIL HFA;VENTOLIN HFA) 108 (90 Base) MCG/ACT inhaler Inhale 2 puffs into the lungs every 6 (six) hours as needed for wheezing or shortness of breath.    Marland Kitchen BREO ELLIPTA 200-25 MCG/INH AEPB Inhale 1 puff into the lungs every evening.     . cefTRIAXone 2 g in sodium chloride 0.9 % 100 mL Inject 2 g into the vein daily. Last dose 05/26/2018 52 vial 0  . cholecalciferol (VITAMIN D) 1000 units tablet Take 2,000 Units by mouth daily.    Marland Kitchen diltiazem (CARDIZEM CD) 360 MG 24 hr capsule Take 1 capsule (360 mg total) by mouth daily. 30 capsule 1  . fluticasone  (FLONASE) 50 MCG/ACT nasal spray Place 1 spray into both nostrils daily.    . furosemide (LASIX) 40 MG tablet Take 1 tablet (40 mg total) by mouth daily. 30 tablet 1  . hydrALAZINE (APRESOLINE) 25 MG tablet Take 3 tablets (75 mg total) by mouth every 8 (eight) hours. 180 tablet 0  . metoprolol tartrate (LOPRESSOR) 100 MG tablet Take 1 tablet (100 mg total) by mouth 2 (two) times daily. 60 tablet 1  . oxyCODONE 10 MG TABS Take 1 tablet (10 mg total) by mouth every 6 (six) hours as needed for moderate pain. 12 tablet 0  . pantoprazole (PROTONIX) 40 MG tablet Take 1 tablet (40 mg total) by mouth 2 (two) times daily. 60 tablet 1  . sodium bicarbonate 650 MG tablet Take 1 tablet (650 mg total) by mouth daily. 30 tablet 0  . traZODone (DESYREL) 50 MG tablet Take 50 mg by mouth at bedtime.    . vancomycin (VANCOCIN) 1-5 GM/200ML-% SOLN Inject 200 mLs (1,000 mg total) into the vein every other day. Last dose on 05/26/2018 4000 mL 0  . warfarin (COUMADIN) 5 MG tablet Take 1 tablet (5 mg total) by mouth daily at 6 PM. 30 tablet 0   No current facility-administered medications for this visit.     On ROS today: 10 system ROS is otherwise negative   Physical Examination   Vitals:   06/14/18 1412  BP: (!) 150/85  Pulse: 61  Resp: 16  Temp: 98.4 F (36.9 C)  TempSrc: Oral  SpO2: 97%  Weight: 208 lb (94.3 kg)  Height: 6\' 3"  (1.905 m)   Body mass index is 26 kg/m.  General Alert, O x 3, WD, NAD  Pulmonary Sym exp, good B air movt, CTA B  Cardiac RRR, Nl S1, S2  Vascular Vessel Right Left  Radial Palpable Palpable  Aorta Not palpable N/A  Femoral Palpable pulse; no palpable fluid collection; no palpable pseudoaneurysm; palpable dense collections of scar tissue Palpable  Popliteal Not palpable Not palpable  DP Palpable Palpable    Gastro- intestinal soft, non-distended, non-tender to palpation,   Musculo- skeletal M/S 5/5 throughout  , Extremities without ischemic changes   Neurologic  A&Oe     Medical Decision Making   Aaron [name] is a 63 y.o. male who presents with persistent R groin and L arm pain   Perfusing BLE well with palpable DP pulses  R groin physical exam unremarkable; pain likely related to numerous CFA exposures and possibly neuropathic  L arm symptoms also likely neuropathic in nature; perfusing hand well with palpable radial  Recommended continued HH PT for help with mobility and ROM  Also recommended referral to pain clinic if patient believes symptoms are lifestyle limiting  Follow up for PAD surveillance in 6 months  Prescribed #20 10-325mg  percocet; Patient is aware that he will need to establish care with pain clinic beyond this prescription   [name] PA-C [facility] Office: [phone]  Clinic MD: Dr. Trula Slade

## 2018-07-12 ENCOUNTER — Ambulatory Visit: Payer: 59 | Admitting: Infectious Diseases

## 2018-07-15 ENCOUNTER — Ambulatory Visit: Payer: 59 | Admitting: Infectious Diseases

## 2018-07-22 ENCOUNTER — Other Ambulatory Visit: Payer: Self-pay | Admitting: *Deleted

## 2018-07-23 ENCOUNTER — Other Ambulatory Visit: Payer: Self-pay

## 2018-07-23 ENCOUNTER — Encounter (HOSPITAL_COMMUNITY): Payer: Self-pay | Admitting: *Deleted

## 2018-07-23 ENCOUNTER — Telehealth: Payer: Self-pay | Admitting: Vascular Surgery

## 2018-07-23 NOTE — Progress Notes (Signed)
Anesthesia Chart Review: SAME DAY WORKUP   Case:  [id number] Date/Time:  07/26/18 0831   Procedure:  INSERTION OF DIALYSIS CATHETER (N/A )   Anesthesia type:  Monitor Anesthesia Care   Pre-op diagnosis:  chronic kidney disease   Location:  [facility] OR ROOM 10 / [place] OR   Surgeon:  Marty Heck, MD     DISCUSSION: 63 y.o. male current smoker. Pertinent hx includes GERD, ESRD (previously on HD, now needs to restart), HTN, PAD, CKD IV/V, pAF, CAD and HFpEF   Recently completed treatment for discitis by ID, PICC removed 05/28/18. Per ID notes, Hep C RNA negative in 2018. Hep B cAb / sAb (+) indicating previously cleared hepatitis b infection.  Follows with vacular surgery for hx of left SFA atherectomy as well as for right iliac and SFA stenting with CFA endarterectomy and subsequent debridement x2 due to nonhealing and fluid collections in right groin. Last seen by VVS 06/14/18 and recommended 72mo f/u for PAD surveillance.   Follows with cardiology at [facility] for the following: 1. Congestive Heart Failure with preserved ejection fraction: LVEF 45% NYHA class II 2. Coronary Artery Disease: Hx of prior MI but not revascularized. Inferior wall HK  3. Paroxysmal Atrial Fibrillation, CHADsVASC- 4 4. Peripheral Arterial Disease: 5. CKD stage V: previously on dialysis   Per notes in care everywhere, pt presented to ED 05/2018 for hypotension and weakness; cardiology note from 06/30/18 provides summary:  "Feb of 2020 when he presented to the ER with lethargy and weakness at the time he was found to be hypotensive with systolic blood pressure in the 70s and bradycardic with HR in the 50s. He responded well to IV fluid but quickly went into heart failure with pulmonary edema and hypoxia. Despite improvement in BP his HR remained low. His presentation was ultimately attributed medication side effect (?beta blockers). During this hospital admission he also had a TTE which showed mildly reduce LVEF at 45% with inferior  wall HK to AK.":  Pt was previously on dialysis, but review of notes from hospitalization 01/26/2018 through 01/29/2018 states the patient refused dialysis and was subsequently taken off of dialysis by nephrology as he was noncompliant and had started making urine again. History of medical noncompliance. He has continued to follow with Dr. Lowanda Foster and per surgery posting he now has urgent need to restart HD.  Will need DOS eval by anesthesia. Anticipate he can proceed as planned barring acute status change.   VS: There were no vitals taken for this visit.  PROVIDERS: [place]  Birdie Sons, MD is Cardiologist  Fran Lowes, MD is Nephrologist  LABS: Will need DOS labs  Labs Reviewed - No data to display   IMAGES: CHEST - 2 VIEW 01/26/2018  COMPARISON:  01/09/2018, 12/04/2017  FINDINGS: Right-sided central venous catheter tip over the SVC. Bibasilar atelectasis or scarring. No pleural effusion. No focal consolidation. Stable cardiomediastinal silhouette with aortic atherosclerosis. No pneumothorax.  IMPRESSION: No active cardiopulmonary disease. Stable scarring and/or atelectasis at both lung bases.    EKG: 01/31/2018: Afib. Vent rate 91. LAFB, Old anterior infarct.   CV: Echo 06/06/2018 care everywhere: Mildly decreased left ventricular systolic function, ejection fraction  45%  Segmental wall motion abnormality - inferior akinesis  Degenerative mitral valve disease  Mitral regurgitation - mild  Dilated left atrium - mild  Normal right ventricular systolic function  Carotid US 08/25/2017: Final Interpretation: Right Carotid: Velocities in the right ICA are consistent with a 1-39% stenosis.  Left Carotid: Velocities in the left ICA are consistent with a 40-59% stenosis.  Vertebrals: Bilateral vertebral arteries demonstrate antegrade flow.  Cath 2002 ANGIOGRAPHIC DATA: 1. Left ventriculography was performed in  the RAO projection. Overall global systolic function was normal. No significant mitral regurgitation was noted. The aortic leaflets appeared to move normally. Ejection fraction was calculated at 59.9%. 2. The left main coronary artery demonstrates minor calcification of its distal-most aspect but without significant focal narrowing. This was otherwise free of significant disease. 3. The LAD coursed to the apex. It was a fairly large caliber vessel providing multiple septal perforators. Importantly, there was a minuscule diagonal branch that appeared to have about 80% proximal narrowing. This vessel was not suitable for percutaneous intervention by its very small size, being 1 mm or less. 4. There was an extremely large ramus intermedius that bifurcated twice. There were multiple sub branches related to this vessel, and the intermedius branch was free of critical disease. 5. There was an AV circumflex that was small and was free of disease. 6. The right coronary artery was a large vessel providing a large acute marginal branch, a moderate-sized posterior descending, and moderate-sized posterolateral branch. The right coronary artery was free of critical disease.  CONCLUSIONS: 1. Normal left ventricular function. 2. Minuscule diagonal branch of insignificant importance with ostial disease. 3. No other critical coronary lesions noted.  Past Medical History:  Diagnosis Date  . Anxiety   . Arthritis    knees , back , shoulders (10/12/2017)  . Atrial fibrillation ([place])   . CAD (coronary artery disease)    Mild nonobstructive disease at cardiac catheterization 2002  . Chronic back pain    "back of my neck; down thru my legs" (10/12/2017)  . COPD (chronic obstructive pulmonary disease) ([place])   . Diverticulitis   . Esophageal reflux   . ESRD (end stage renal disease) on dialysis [facility])    "TTS; [place]" (11/23/2017)  . Essential hypertension    . Hepatitis C    states he no longer has this  . History of kidney stones   . History of syncope   . Hyperlipidemia   . Jerking 09/23/2014  . Myocardial infarction ([place]) 02/2017   "light one" (10/12/2017)  . Non-compliant behavior    non compliant with diaylsis per daughter  . PAT (paroxysmal atrial tachycardia) ([place])   . Peripheral arterial disease ([place])    Occluded left superficial femoral artery status post stent June 2016 - Dr. Trula Slade  . Pneumonia 1961  . Polycystic kidney, unspecified type   . Syncope 09/2014  . SYNCOPE 05/07/2010   Qualifier: Diagnosis of  By: Laurance Flatten RN, BSN, Anderson Malta      Past Surgical History:  Procedure Laterality Date  . ABDOMINAL AORTOGRAM N/A 08/11/2017   Procedure: ABDOMINAL AORTOGRAM;  Surgeon: Serafina Mitchell, MD;  Location: [place] CV LAB;  Service: Cardiovascular;  Laterality: N/A;  . ANTERIOR CERVICAL DECOMP/DISCECTOMY FUSION    . APPLICATION OF WOUND VAC Right 08/14/2017   Procedure: APPLICATION OF PREVENA INCISIONAL WOUND VAC RIGHT GROIN;  Surgeon: Serafina Mitchell, MD;  [facility];  Service: Vascular;  Laterality: Right;  . AV FISTULA PLACEMENT Left 09/04/2017   Procedure: ARTERIOVENOUS (AV) FISTULA CREATION LEFT UPPER ARM;  Surgeon: Serafina Mitchell, MD;  Location: [place];  Service: Vascular;  Laterality: Left;  . BACK SURGERY    . BASCILIC VEIN TRANSPOSITION Left 11/20/2017   Procedure: SECOND STAGE BASILIC VEIN TRANSPOSITION LEFT ARM;  Surgeon: Harold Barban  W, MD;  Location: [place];  Service: Vascular;  Laterality: Left;  . BIOPSY  12/17/2016   Procedure: BIOPSY;  Surgeon: Daneil Dolin, MD;  [facility];  Service: Gastroenterology;;  gastric colon  . BIOPSY  04/29/2017   Procedure: BIOPSY;  Surgeon: Daneil Dolin, MD;  [facility];  Service: Endoscopy;;  duodenal biopsies  . BUNIONECTOMY Bilateral   . COLONOSCOPY  2008   Dr. Oneida Alar: rare sigmoid colon diverticulosis, internal hemorrhoids.   . COLONOSCOPY WITH  PROPOFOL N/A 12/17/2016   dense left-sided diverticulosis, right colon ulcers s/p biopsy query occult NSAID use vs transient ischemia, not consistent with IBD. CMV stains negative.   Marland Kitchen ENDARTERECTOMY FEMORAL Right 08/14/2017   Procedure: RIGHT ILLIO-FEMORAL ENDARTERECTOMY;  Surgeon: Serafina Mitchell, MD;  Location: [facility] OR;  Service: Vascular;  Laterality: Right;  . ESOPHAGEAL DILATION  12/17/2016   EGD with mild Schatzki's ring s/p dilatation, small hiatal hernia, erosive gastropathy (negative H.pylori gastritis)  . ESOPHAGOGASTRODUODENOSCOPY  2008   Dr. Oneida Alar: normal esophagus without Barrett's, antritis and duodenitis, path with H.pylori gastritis  . ESOPHAGOGASTRODUODENOSCOPY (EGD) WITH PROPOFOL N/A 12/17/2016   Procedure: ESOPHAGOGASTRODUODENOSCOPY (EGD) WITH PROPOFOL;  Surgeon: Daneil Dolin, MD;  [facility];  Service: Gastroenterology;  Laterality: N/A;  . ESOPHAGOGASTRODUODENOSCOPY (EGD) WITH PROPOFOL N/A 04/29/2017   Patchy erythema of gastric mucosa diffusely, extensive inflammatory changes in duodenum, geographic ulceration and mucosal edema present, encroaching somewhat on the lumen yet still widely patent, distal second portion of duodenum appeared abnormal, path with peptic duodenitis with ulceration  . GROIN DEBRIDEMENT Right 10/14/2017   Procedure: RIGHT GROIN AND RIGHT LOWER QUADRANT ABDOMEN DEBRIDEMENT WITH PLACEMENT OF ANTIBIOTIC BEADS;  Surgeon: Serafina Mitchell, MD;  Location: [place];  Service: Vascular;  Laterality: Right;  . HEMATOMA EVACUATION Left 12/06/2017   Procedure: EVACUATION HEMATOMA;  Surgeon: Waynetta Sandy, MD;  Location: [place];  Service: Vascular;  Laterality: Left;  . HERNIA REPAIR  5809   umbilical  . I&D EXTREMITY Right 09/21/2017   Procedure: IRRIGATION AND DEBRIDEMENT GROIN;  Surgeon: Elam Dutch, MD;  Location: [facility] OR;  Service: Vascular;  Laterality: Right;  . I&D EXTREMITY Left 11/23/2017   Procedure: Evacuation Hematoma LEFT UPPER ARM  GRAFT;  Surgeon: Elam Dutch, MD;  Location: [place];  Service: Vascular;  Laterality: Left;  . INGUINAL HERNIA REPAIR Right 02/2017   [place]  . INSERTION OF DIALYSIS CATHETER Right 09/04/2017   Procedure: INSERTION OF TUNNELED  DIALYSIS CATHETER - RIGHT INTERNAL JUGULAR PLACEMENT;  Surgeon: Serafina Mitchell, MD;  Location: [place];  Service: Vascular;  Laterality: Right;  . INSERTION OF ILIAC STENT Right 08/14/2017   Procedure: INSERTION OF RIGHT COMMON ILIAC STENT 19mm x 46mm x 130cm INSERTION OF RIGHT EXTERNAL ILIAC STENT 57mm x 3mm x 130cm INSERTION OF SUPERFICIAL FERMORAL ARTERY STENT 71mm x 26mm x 130cm;  Surgeon: Serafina Mitchell, MD;  Location: [facility] OR;  Service: Vascular;  Laterality: Right;  . IR FLUORO GUIDE CV LINE RIGHT  08/31/2017  . IR REMOVAL TUN CV CATH W/O FL  05/28/2018  . IR US GUIDE VASC ACCESS RIGHT  08/31/2017  . LOWER EXTREMITY ANGIOGRAPHY Right 08/11/2017   Procedure: LOWER EXTREMITY ANGIOGRAPHY;  Surgeon: Serafina Mitchell, MD;  Location: [place] CV LAB;  Service: Cardiovascular;  Laterality: Right;  . PATCH ANGIOPLASTY Right 08/14/2017   Procedure: PATCH ANGIOPLASTY USING HEMASHIELD PATCH 0.3IN Lillie Columbia;  Surgeon: Serafina Mitchell, MD;  [facility];  Service: Vascular;  Laterality: Right;  . PERIPHERAL VASCULAR CATHETERIZATION N/A 09/20/2014   Procedure: Abdominal Aortogram;  Surgeon: Serafina Mitchell, MD;  Location: [place] CV LAB;  Service: Cardiovascular;  Laterality: N/A;  . POSTERIOR FUSION LUMBAR SPINE    . TEE WITHOUT CARDIOVERSION N/A 01/12/2018   Procedure: TRANSESOPHAGEAL ECHOCARDIOGRAM (TEE);  Surgeon: Lelon Perla, MD;  Location: [facility] ENDOSCOPY;  Service: Cardiovascular;  Laterality: N/A;    MEDICATIONS: No current facility-administered medications for this encounter.    Marland Kitchen albuterol (PROVENTIL HFA;VENTOLIN HFA) 108 (90 Base) MCG/ACT inhaler  . albuterol (PROVENTIL) (2.5 MG/3ML) 0.083% nebulizer solution  . BREO ELLIPTA 200-25 MCG/INH AEPB  . calcium  acetate (PHOSLO) 667 MG capsule  . Cholecalciferol (VITAMIN D3) 50 MCG (2000 UT) TABS  . clopidogrel (PLAVIX) 75 MG tablet  . diltiazem (CARDIZEM CD) 360 MG 24 hr capsule  . ELIQUIS 2.5 MG TABS tablet  . fluticasone (FLONASE) 50 MCG/ACT nasal spray  . furosemide (LASIX) 80 MG tablet  . gabapentin (NEURONTIN) 300 MG capsule  . hydrALAZINE (APRESOLINE) 50 MG tablet  . isosorbide mononitrate (IMDUR) 60 MG 24 hr tablet  . metoprolol tartrate (LOPRESSOR) 100 MG tablet  . nitroGLYCERIN (NITROSTAT) 0.4 MG SL tablet  . oxyCODONE-acetaminophen (PERCOCET) 10-325 MG tablet  . pantoprazole (PROTONIX) 40 MG tablet  . sodium bicarbonate 650 MG tablet  . traZODone (DESYREL) 50 MG tablet  . cefTRIAXone 2 g in sodium chloride 0.9 % 100 mL  . furosemide (LASIX) 40 MG tablet  . vancomycin (VANCOCIN) 1-5 GM/200ML-% SOLN  . warfarin (COUMADIN) 5 MG tablet     Wynonia Musty [facility] Short Stay Center/Anesthesiology Phone [phone] 07/23/2018 11:46 AM

## 2018-07-23 NOTE — Anesthesia Preprocedure Evaluation (Addendum)
Anesthesia Evaluation  Patient identified by MRN, date of birth, ID band Patient awake    Reviewed: Allergy & Precautions, NPO status , Patient's Chart, lab work & pertinent test results  Airway Mallampati: II  TM Distance: >3 FB     Dental   Pulmonary pneumonia, COPD, Current Smoker,    breath sounds clear to auscultation       Cardiovascular hypertension, + Past MI and + Peripheral Vascular Disease   Rhythm:Regular Rate:Normal     Neuro/Psych    GI/Hepatic GERD  ,(+) Hepatitis -  Endo/Other    Renal/GU Renal disease     Musculoskeletal  (+) Arthritis ,   Abdominal   Peds  Hematology  (+) anemia ,   Anesthesia Other Findings   Reproductive/Obstetrics                            Anesthesia Physical Anesthesia Plan  ASA: III  Anesthesia Plan: MAC   Post-op Pain Management:    Induction:   PONV Risk Score and Plan: Ondansetron and Dexamethasone  Airway Management Planned: Simple Face Mask  Additional Equipment:   Intra-op Plan:   Post-operative Plan:   Informed Consent: I have reviewed the patients History and Physical, chart, labs and discussed the procedure including the risks, benefits and alternatives for the proposed anesthesia with the patient or authorized representative who has indicated his/her understanding and acceptance.     Dental advisory given  Plan Discussed with: CRNA and Anesthesiologist  Anesthesia Plan Comments: (See PAT note by Karoline Caldwell, PA-C )       Anesthesia Quick Evaluation

## 2018-07-23 NOTE — Telephone Encounter (Signed)
Called by pre adm testing regarding Xarelto and plavix for dialysis cath on Monday  Told pt to stop Xarelto 36 hr prior to procedure ok to continue plavix  Ruta Hinds, MD [facility] [place]: [phone] Pager: [phone]

## 2018-07-23 NOTE — Progress Notes (Signed)
Spoke with pt for pre-op call. Pt has hx of mild CAT, Paroxymal Atrial Tachycardia and Atrial Fibrillation. Pt's cardiologist is Dr. Debara Pickett. Pt is on Plavix and Eliquis. He states he's not been instructed to stop either one. Attempted to call Dr. Ainsley Spinner office and they are closed. Have paged Dr. Oneida Alar to get instructions. Pt states he is not diabetic.    Coronavirus Screening  Have you experienced the following symptoms:  Cough no Fever (>100.3F)  no Runny nose no Sore throat no Difficulty breathing/shortness of breath  no  Have you or a family member traveled in the last 14 days and where? no

## 2018-07-23 NOTE — Progress Notes (Signed)
Dr. Oneida Alar returned the page and instructed me to have pt continue his Plavix and to stop Eliquis 36 hours prior to surgery. I called pt back and he states he has not taken his first dose of Eliquis today. I told him to stop taking as of now. I did instruct him to continue the Plavix and take it the morning of surgery. Pt voiced understanding. Pt's last dose of Eliquis was 07/22/18 PM dose

## 2018-07-26 ENCOUNTER — Encounter (HOSPITAL_COMMUNITY): Payer: Self-pay | Admitting: *Deleted

## 2018-07-26 ENCOUNTER — Ambulatory Visit (HOSPITAL_COMMUNITY): Payer: 59 | Admitting: Physician Assistant

## 2018-07-26 ENCOUNTER — Encounter (HOSPITAL_COMMUNITY)
Admission: RE | Disposition: A | Discharge status: Home / Self Care | Payer: Self-pay | Source: Home / Self Care | Attending: Vascular Surgery

## 2018-07-26 ENCOUNTER — Ambulatory Visit (HOSPITAL_COMMUNITY)
Admission: RE | Admit: 2018-07-26 | Discharge: 2018-07-26 | Disposition: A | Discharge status: Home / Self Care | Payer: 59 | Attending: Vascular Surgery | Admitting: Vascular Surgery

## 2018-07-26 ENCOUNTER — Other Ambulatory Visit: Payer: Self-pay

## 2018-07-26 ENCOUNTER — Ambulatory Visit (HOSPITAL_COMMUNITY): Payer: 59

## 2018-07-26 DIAGNOSIS — I120 Hypertensive chronic kidney disease with stage 5 chronic kidney disease or end stage renal disease: Secondary | ICD-10-CM | POA: Diagnosis present

## 2018-07-26 DIAGNOSIS — Z7901 Long term (current) use of anticoagulants: Secondary | ICD-10-CM | POA: Diagnosis not present

## 2018-07-26 DIAGNOSIS — M170 Bilateral primary osteoarthritis of knee: Secondary | ICD-10-CM | POA: Insufficient documentation

## 2018-07-26 DIAGNOSIS — I4891 Unspecified atrial fibrillation: Secondary | ICD-10-CM | POA: Insufficient documentation

## 2018-07-26 DIAGNOSIS — I471 Supraventricular tachycardia: Secondary | ICD-10-CM | POA: Insufficient documentation

## 2018-07-26 DIAGNOSIS — N185 Chronic kidney disease, stage 5: Secondary | ICD-10-CM

## 2018-07-26 DIAGNOSIS — B1920 Unspecified viral hepatitis C without hepatic coma: Secondary | ICD-10-CM | POA: Insufficient documentation

## 2018-07-26 DIAGNOSIS — F17210 Nicotine dependence, cigarettes, uncomplicated: Secondary | ICD-10-CM | POA: Insufficient documentation

## 2018-07-26 DIAGNOSIS — K219 Gastro-esophageal reflux disease without esophagitis: Secondary | ICD-10-CM | POA: Insufficient documentation

## 2018-07-26 DIAGNOSIS — M19011 Primary osteoarthritis, right shoulder: Secondary | ICD-10-CM | POA: Insufficient documentation

## 2018-07-26 DIAGNOSIS — Z7951 Long term (current) use of inhaled steroids: Secondary | ICD-10-CM | POA: Insufficient documentation

## 2018-07-26 DIAGNOSIS — Z7902 Long term (current) use of antithrombotics/antiplatelets: Secondary | ICD-10-CM | POA: Insufficient documentation

## 2018-07-26 DIAGNOSIS — Z95820 Peripheral vascular angioplasty status with implants and grafts: Secondary | ICD-10-CM | POA: Insufficient documentation

## 2018-07-26 DIAGNOSIS — M19012 Primary osteoarthritis, left shoulder: Secondary | ICD-10-CM | POA: Insufficient documentation

## 2018-07-26 DIAGNOSIS — Z419 Encounter for procedure for purposes other than remedying health state, unspecified: Secondary | ICD-10-CM

## 2018-07-26 DIAGNOSIS — I252 Old myocardial infarction: Secondary | ICD-10-CM | POA: Diagnosis not present

## 2018-07-26 DIAGNOSIS — I739 Peripheral vascular disease, unspecified: Secondary | ICD-10-CM | POA: Insufficient documentation

## 2018-07-26 DIAGNOSIS — Z79899 Other long term (current) drug therapy: Secondary | ICD-10-CM | POA: Insufficient documentation

## 2018-07-26 DIAGNOSIS — I2510 Atherosclerotic heart disease of native coronary artery without angina pectoris: Secondary | ICD-10-CM | POA: Diagnosis not present

## 2018-07-26 DIAGNOSIS — N186 End stage renal disease: Secondary | ICD-10-CM

## 2018-07-26 DIAGNOSIS — J449 Chronic obstructive pulmonary disease, unspecified: Secondary | ICD-10-CM | POA: Diagnosis not present

## 2018-07-26 HISTORY — PX: INSERTION OF DIALYSIS CATHETER: SHX1324

## 2018-07-26 LAB — PROTIME-INR
INR: 0.9 (ref 0.8–1.2)
Prothrombin Time: 11.8 seconds (ref 11.4–15.2)

## 2018-07-26 LAB — POCT I-STAT 4, (NA,K, GLUC, HGB,HCT)
Glucose, Bld: 93 mg/dL (ref 70–99)
HCT: 30 % — ABNORMAL LOW (ref 39.0–52.0)
Hemoglobin: 10.2 g/dL — ABNORMAL LOW (ref 13.0–17.0)
Potassium: 4.9 mmol/L (ref 3.5–5.1)
Sodium: 136 mmol/L (ref 135–145)

## 2018-07-26 SURGERY — INSERTION OF DIALYSIS CATHETER
Anesthesia: Monitor Anesthesia Care | Laterality: Right

## 2018-07-26 MED ORDER — SODIUM CHLORIDE 0.9 % IV SOLN
INTRAVENOUS | Status: DC | PRN
  Administered 2018-07-26: 10:00:00

## 2018-07-26 MED ORDER — MIDAZOLAM HCL 5 MG/5ML IJ SOLN
INTRAMUSCULAR | Status: DC | PRN
  Administered 2018-07-26 (×2): 1 mg via INTRAVENOUS

## 2018-07-26 MED ORDER — FENTANYL CITRATE (PF) 100 MCG/2ML IJ SOLN
INTRAMUSCULAR | Status: DC | PRN
  Administered 2018-07-26 (×3): 50 ug via INTRAVENOUS
  Administered 2018-07-26: 100 ug via INTRAVENOUS

## 2018-07-26 MED ORDER — HEPARIN SODIUM (PORCINE) 1000 UNIT/ML IJ SOLN
INTRAMUSCULAR | Status: DC | PRN
  Administered 2018-07-26: 3000 [IU]

## 2018-07-26 MED ORDER — LIDOCAINE HCL 1 % IJ SOLN
INTRAMUSCULAR | Status: DC | PRN
  Administered 2018-07-26: 9 mL

## 2018-07-26 MED ORDER — LIDOCAINE HCL (CARDIAC) PF 100 MG/5ML IV SOSY
PREFILLED_SYRINGE | INTRAVENOUS | Status: DC | PRN
  Administered 2018-07-26: 100 mg via INTRATRACHEAL

## 2018-07-26 MED ORDER — SODIUM CHLORIDE 0.9 % IV SOLN
INTRAVENOUS | Status: AC
  Filled 2018-07-26: qty 1.2

## 2018-07-26 MED ORDER — HEPARIN SODIUM (PORCINE) 1000 UNIT/ML IJ SOLN
INTRAMUSCULAR | Status: AC
  Filled 2018-07-26: qty 1

## 2018-07-26 MED ORDER — PROPOFOL 500 MG/50ML IV EMUL
INTRAVENOUS | Status: DC | PRN
  Administered 2018-07-26: 50 ug/kg/min via INTRAVENOUS

## 2018-07-26 MED ORDER — FENTANYL CITRATE (PF) 250 MCG/5ML IJ SOLN
INTRAMUSCULAR | Status: AC
  Filled 2018-07-26: qty 5

## 2018-07-26 MED ORDER — ALBUTEROL SULFATE (2.5 MG/3ML) 0.083% IN NEBU
2.5000 mg | INHALATION_SOLUTION | Freq: Once | RESPIRATORY_TRACT | Status: AC
  Administered 2018-07-26: 2.5 mg via RESPIRATORY_TRACT

## 2018-07-26 MED ORDER — ALBUTEROL SULFATE (2.5 MG/3ML) 0.083% IN NEBU
INHALATION_SOLUTION | RESPIRATORY_TRACT | Status: AC
  Filled 2018-07-26: qty 3

## 2018-07-26 MED ORDER — 0.9 % SODIUM CHLORIDE (POUR BTL) OPTIME
TOPICAL | Status: DC | PRN
  Administered 2018-07-26: 1000 mL

## 2018-07-26 MED ORDER — HYDROCODONE-ACETAMINOPHEN 5-325 MG PO TABS: 1.0000 | ORAL_TABLET | ORAL | 0 refills | Status: DC | PRN

## 2018-07-26 MED ORDER — METOPROLOL TARTRATE 50 MG PO TABS
100.0000 mg | ORAL_TABLET | Freq: Once | ORAL | Status: AC
  Administered 2018-07-26: 100 mg via ORAL
  Filled 2018-07-26: qty 2

## 2018-07-26 MED ORDER — PROPOFOL 10 MG/ML IV BOLUS
INTRAVENOUS | Status: AC
  Filled 2018-07-26: qty 20

## 2018-07-26 MED ORDER — CEFAZOLIN SODIUM-DEXTROSE 2-4 GM/100ML-% IV SOLN
2.0000 g | INTRAVENOUS | Status: AC
  Administered 2018-07-26: 2 g via INTRAVENOUS
  Filled 2018-07-26: qty 100

## 2018-07-26 MED ORDER — LIDOCAINE HCL (PF) 1 % IJ SOLN
INTRAMUSCULAR | Status: AC
  Filled 2018-07-26: qty 30

## 2018-07-26 MED ORDER — SODIUM CHLORIDE 0.9 % IV SOLN
INTRAVENOUS | Status: DC
  Administered 2018-07-26: 08:00:00 via INTRAVENOUS

## 2018-07-26 MED ORDER — MIDAZOLAM HCL 2 MG/2ML IJ SOLN
INTRAMUSCULAR | Status: AC
  Filled 2018-07-26: qty 2

## 2018-07-26 MED ORDER — PROPOFOL 10 MG/ML IV BOLUS
INTRAVENOUS | Status: DC | PRN
  Administered 2018-07-26: 20 mg via INTRAVENOUS

## 2018-07-26 SURGICAL SUPPLY — 47 items
ADH SKN CLS APL DERMABOND .7 (GAUZE/BANDAGES/DRESSINGS) ×1
BAG DECANTER FOR FLEXI CONT (MISCELLANEOUS) ×3 IMPLANT
BIOPATCH RED 1 DISK 7.0 (GAUZE/BANDAGES/DRESSINGS) ×2 IMPLANT
BIOPATCH RED 1IN DISK 7.0MM (GAUZE/BANDAGES/DRESSINGS) ×1
CATH PALINDROME RT-P 15FX19CM (CATHETERS) ×2 IMPLANT
CATH PALINDROME RT-P 15FX23CM (CATHETERS) IMPLANT
CATH PALINDROME RT-P 15FX28CM (CATHETERS) IMPLANT
CATH PALINDROME RT-P 15FX55CM (CATHETERS) IMPLANT
CATH STRAIGHT 5FR 65CM (CATHETERS) IMPLANT
COVER PROBE W GEL 5X96 (DRAPES) ×3 IMPLANT
COVER SURGICAL LIGHT HANDLE (MISCELLANEOUS) ×3 IMPLANT
COVER WAND RF STERILE (DRAPES) ×3 IMPLANT
DECANTER SPIKE VIAL GLASS SM (MISCELLANEOUS) ×3 IMPLANT
DERMABOND ADVANCED (GAUZE/BANDAGES/DRESSINGS) ×2
DERMABOND ADVANCED .7 DNX12 (GAUZE/BANDAGES/DRESSINGS) ×1 IMPLANT
DRAPE C-ARM 42X72 X-RAY (DRAPES) ×3 IMPLANT
DRAPE CHEST BREAST 15X10 FENES (DRAPES) ×3 IMPLANT
DRSG COVADERM 4X6 (GAUZE/BANDAGES/DRESSINGS) ×2 IMPLANT
GAUZE 4X4 16PLY RFD (DISPOSABLE) ×3 IMPLANT
GLOVE BIO SURGEON STRL SZ7.5 (GLOVE) ×3 IMPLANT
GLOVE BIOGEL PI IND STRL 8 (GLOVE) ×1 IMPLANT
GLOVE BIOGEL PI INDICATOR 8 (GLOVE) ×2
GOWN STRL REUS W/ TWL LRG LVL3 (GOWN DISPOSABLE) ×2 IMPLANT
GOWN STRL REUS W/ TWL XL LVL3 (GOWN DISPOSABLE) ×2 IMPLANT
GOWN STRL REUS W/TWL LRG LVL3 (GOWN DISPOSABLE) ×6
GOWN STRL REUS W/TWL XL LVL3 (GOWN DISPOSABLE) ×6
KIT BASIN OR (CUSTOM PROCEDURE TRAY) ×3 IMPLANT
KIT TURNOVER KIT B (KITS) ×3 IMPLANT
NDL 18GX1X1/2 (RX/OR ONLY) (NEEDLE) ×1 IMPLANT
NDL HYPO 25GX1X1/2 BEV (NEEDLE) ×1 IMPLANT
NEEDLE 18GX1X1/2 (RX/OR ONLY) (NEEDLE) ×3 IMPLANT
NEEDLE HYPO 25GX1X1/2 BEV (NEEDLE) ×3 IMPLANT
NS IRRIG 1000ML POUR BTL (IV SOLUTION) ×3 IMPLANT
PACK SURGICAL SETUP 50X90 (CUSTOM PROCEDURE TRAY) ×3 IMPLANT
PAD ARMBOARD 7.5X6 YLW CONV (MISCELLANEOUS) ×6 IMPLANT
SET MICROPUNCTURE 5F STIFF (MISCELLANEOUS) IMPLANT
SOAP 2 % CHG 4 OZ (WOUND CARE) ×3 IMPLANT
SUT ETHILON 3 0 PS 1 (SUTURE) ×3 IMPLANT
SUT MNCRL AB 4-0 PS2 18 (SUTURE) ×5 IMPLANT
SYR 10ML LL (SYRINGE) ×3 IMPLANT
SYR 20CC LL (SYRINGE) ×6 IMPLANT
SYR 5ML LL (SYRINGE) ×2 IMPLANT
SYR CONTROL 10ML LL (SYRINGE) ×3 IMPLANT
TOWEL GREEN STERILE (TOWEL DISPOSABLE) ×3 IMPLANT
TOWEL GREEN STERILE FF (TOWEL DISPOSABLE) ×3 IMPLANT
WATER STERILE IRR 1000ML POUR (IV SOLUTION) ×3 IMPLANT
WIRE AMPLATZ SS-J .035X180CM (WIRE) IMPLANT

## 2018-07-26 NOTE — Op Note (Signed)
° ° °  OPERATIVE NOTE  PROCEDURE: 1. Right internal jugular vein cannulation under ultrasound guidance 2. Right internal jugular vein tunneled dialysis catheter placement (19 cm Palindrome)  PRE-OPERATIVE DIAGNOSIS: end-stage renal failure  POST-OPERATIVE DIAGNOSIS: same as above  SURGEON: Marty Heck, MD  ANESTHESIA: MAC  ESTIMATED BLOOD LOSS: 30 cc  FINDING(S): 1.  Tips of the catheter in the right atrium on fluoroscopy 2.  No obvious pneumothorax on fluoroscopy  SPECIMEN(S):  none  INDICATIONS:   Aaron [name] is a 63 y.o. male who presents with end stage renal disease.  The patient presents for tunneled dialysis catheter placement.  The patient is aware the risks of tunneled dialysis catheter placement include but are not limited to: bleeding, infection, central venous injury, pneumothorax, possible venous stenosis, possible malpositioning in the venous system, and possible infections related to long-term catheter presence.  The patient was aware of these risks and agreed to proceed.  DESCRIPTION: After written full informed consent was obtained from the patient, the patient was taken back to the operating room.  Prior to induction, the patient was given IV antibiotics.  After obtaining adequate sedation, the patient was prepped and draped in the standard fashion for a chest or neck tunneled dialysis catheter placement.  The cannulation site, the catheter exit site, and tract for the subcutaneous tunnel were then anesthestized with a total of 10 cc of 1% lidocaine without epinephrine.  Under ultrasound guidance, the right internal jugular vein was cannulated with the 18 gauge needle.  A J-wire was then placed down into the right ventricle under fluoroscopic guidance.  The wire was then secured in place with a clamp to the drapes.  I then made stab incisions at the neck and exit sites.   I dissected from the exit site to the cannulation site with a tunneler.   The back end of  this catheter was transected, and docked onto the tunneler.  The distal catheter was delivered through the subcutaneous tunnel.  The catheter was transected a second time, revealing the two lumens of this catheter and clamped.   The wire was then unclamped and I removed the needle.  The skin tract and venotomy was dilated serially with dilators.  Finally, the dilator-sheath was placed under fluoroscopic guidance into the superior vena cava.  The dilator and wire were removed.  A 19 cm Diatek catheter was placed under fluoroscopic guidance down into the right atrium.  The sheath was broken and peeled away while holding the catheter cuff at the level of the skin. The ports were docked onto these two lumens.  The catheter collar was then snapped into place.  Each port was tested by aspirating and flushing.  No resistance was noted.  Each port was then thoroughly flushed with heparinized saline.  The catheter was secured in placed with two interrupted stitches of 3-0 Nylon tied to the catheter.  The neck incision was closed with a U-stitch of 4-0 Monocryl.  The neck and chest incision were cleaned and sterile bandages applied.  Each port was then loaded with concentrated heparin (1000 Units/mL) at the manufacturer recommended volumes to each port.  Sterile caps were applied to each port.    On completion fluoroscopy, the tips of the catheter were in the right atrium, and there was no evidence of pneumothorax.  COMPLICATIONS: None  CONDITION: Stable  Marty Heck, MD [facility] [place] Office: [phone] Pager: [phone]   07/26/2018, 9:50 AM

## 2018-07-26 NOTE — H&P (Signed)
H&P    History of Present Illness: This is a 63 y.o. male with multiple medical problems that presents for tunneled dialysis catheter placement.  Patient reports he is supposed to start dialysis again and does not have access.  States his kidneys are failing again.  Multiple previous RIJ catheters, states no previous left sided catheters.  No ICD or pacemaker.  Previous left sided basilic vein fistula by Dr. Trula Slade that ultimately had to be ligated.  No fistula access at this time.  Past Medical History:  Diagnosis Date  . Anxiety   . Arthritis    knees , back , shoulders (10/12/2017)  . Atrial fibrillation ([place])   . CAD (coronary artery disease)    Mild nonobstructive disease at cardiac catheterization 2002  . Chronic back pain    "back of my neck; down thru my legs" (10/12/2017)  . COPD (chronic obstructive pulmonary disease) ([place])   . Diverticulitis   . Esophageal reflux   . ESRD (end stage renal disease) on dialysis [facility])    "TTS; [place]" (11/23/2017)  . Essential hypertension   . Hepatitis C    states he no longer has this  . History of kidney stones   . History of syncope   . Hyperlipidemia   . Jerking 09/23/2014  . Myocardial infarction ([place]) 02/2017   "light one" (10/12/2017)  . Non-compliant behavior    non compliant with diaylsis per daughter  . PAT (paroxysmal atrial tachycardia) ([place])   . Peripheral arterial disease ([place])    Occluded left superficial femoral artery status post stent June 2016 - Dr. Trula Slade  . Pneumonia 1961  . Polycystic kidney, unspecified type   . Syncope 09/2014  . SYNCOPE 05/07/2010   Qualifier: Diagnosis of  By: Laurance Flatten RN, BSN, Anderson Malta      Past Surgical History:  Procedure Laterality Date  . ABDOMINAL AORTOGRAM N/A 08/11/2017   Procedure: ABDOMINAL AORTOGRAM;  Surgeon: Serafina Mitchell, MD;  Location: [place] [facility];  Service: Cardiovascular;  Laterality: N/A;  . ANTERIOR CERVICAL DECOMP/DISCECTOMY FUSION    . APPLICATION OF WOUND VAC Right  08/14/2017   Procedure: APPLICATION OF PREVENA INCISIONAL WOUND VAC RIGHT GROIN;  Surgeon: Serafina Mitchell, MD;  [facility];  Service: Vascular;  Laterality: Right;  . AV FISTULA PLACEMENT Left 09/04/2017   Procedure: ARTERIOVENOUS (AV) FISTULA CREATION LEFT UPPER ARM;  Surgeon: Serafina Mitchell, MD;  Location: [place];  Service: Vascular;  Laterality: Left;  . BACK SURGERY    . BASCILIC VEIN TRANSPOSITION Left 11/20/2017   Procedure: SECOND STAGE BASILIC VEIN TRANSPOSITION LEFT ARM;  Surgeon: Serafina Mitchell, MD;  Location: [place];  Service: Vascular;  Laterality: Left;  . BIOPSY  12/17/2016   Procedure: BIOPSY;  Surgeon: Daneil Dolin, MD;  [facility];  Service: Gastroenterology;;  gastric colon  . BIOPSY  04/29/2017   Procedure: BIOPSY;  Surgeon: Daneil Dolin, MD;  [facility];  Service: Endoscopy;;  duodenal biopsies  . BUNIONECTOMY Bilateral   . COLONOSCOPY  2008   Dr. Oneida Alar: rare sigmoid colon diverticulosis, internal hemorrhoids.   . COLONOSCOPY WITH PROPOFOL N/A 12/17/2016   dense left-sided diverticulosis, right colon ulcers s/p biopsy query occult NSAID use vs transient ischemia, not consistent with IBD. CMV stains negative.   Marland Kitchen ENDARTERECTOMY FEMORAL Right 08/14/2017   Procedure: RIGHT ILLIO-FEMORAL ENDARTERECTOMY;  Surgeon: Serafina Mitchell, MD;  Location: [facility] OR;  Service: Vascular;  Laterality: Right;  . ESOPHAGEAL DILATION  12/17/2016   EGD with mild Schatzki's ring s/p dilatation, small hiatal hernia, erosive gastropathy (negative H.pylori gastritis)  . ESOPHAGOGASTRODUODENOSCOPY  2008   Dr. Oneida Alar: normal esophagus without Barrett's, antritis and duodenitis, path with H.pylori gastritis  . ESOPHAGOGASTRODUODENOSCOPY (EGD) WITH PROPOFOL N/A 12/17/2016   Procedure: ESOPHAGOGASTRODUODENOSCOPY (EGD) WITH PROPOFOL;  Surgeon: Daneil Dolin, MD;  [facility];  Service: Gastroenterology;  Laterality: N/A;  . ESOPHAGOGASTRODUODENOSCOPY (EGD) WITH  PROPOFOL N/A 04/29/2017   Patchy erythema of gastric mucosa diffusely, extensive inflammatory changes in duodenum, geographic ulceration and mucosal edema present, encroaching somewhat on the lumen yet still widely patent, distal second portion of duodenum appeared abnormal, path with peptic duodenitis with ulceration  . GROIN DEBRIDEMENT Right 10/14/2017   Procedure: RIGHT GROIN AND RIGHT LOWER QUADRANT ABDOMEN DEBRIDEMENT WITH PLACEMENT OF ANTIBIOTIC BEADS;  Surgeon: Serafina Mitchell, MD;  Location: [place];  Service: Vascular;  Laterality: Right;  . HEMATOMA EVACUATION Left 12/06/2017   Procedure: EVACUATION HEMATOMA;  Surgeon: Waynetta Sandy, MD;  Location: [place];  Service: Vascular;  Laterality: Left;  . HERNIA REPAIR  2952   umbilical  . I&D EXTREMITY Right 09/21/2017   Procedure: IRRIGATION AND DEBRIDEMENT GROIN;  Surgeon: Elam Dutch, MD;  Location: [facility] OR;  Service: Vascular;  Laterality: Right;  . I&D EXTREMITY Left 11/23/2017   Procedure: Evacuation Hematoma LEFT UPPER ARM GRAFT;  Surgeon: Elam Dutch, MD;  Location: [place];  Service: Vascular;  Laterality: Left;  . INGUINAL HERNIA REPAIR Right 02/2017   [place]  . INSERTION OF DIALYSIS CATHETER Right 09/04/2017   Procedure: INSERTION OF TUNNELED  DIALYSIS CATHETER - RIGHT INTERNAL JUGULAR PLACEMENT;  Surgeon: Serafina Mitchell, MD;  Location: [place];  Service: Vascular;  Laterality: Right;  . INSERTION OF ILIAC STENT Right 08/14/2017   Procedure: INSERTION OF RIGHT COMMON ILIAC STENT 20mm x 37mm x 130cm INSERTION OF RIGHT EXTERNAL ILIAC STENT 19mm x 72mm x 130cm INSERTION OF SUPERFICIAL FERMORAL ARTERY STENT 20mm x 22mm x 130cm;  Surgeon: Serafina Mitchell, MD;  Location: [facility] OR;  Service: Vascular;  Laterality: Right;  . IR FLUORO GUIDE CV LINE RIGHT  08/31/2017  . IR REMOVAL TUN CV CATH W/O FL  05/28/2018  . IR US GUIDE VASC ACCESS RIGHT  08/31/2017  . LOWER EXTREMITY ANGIOGRAPHY Right 08/11/2017   Procedure: LOWER EXTREMITY  ANGIOGRAPHY;  Surgeon: Serafina Mitchell, MD;  Location: [place] [facility];  Service: Cardiovascular;  Laterality: Right;  . PATCH ANGIOPLASTY Right 08/14/2017   Procedure: PATCH ANGIOPLASTY USING HEMASHIELD PATCH 0.3IN Lillie Columbia;  Surgeon: Serafina Mitchell, MD;  [facility];  Service: Vascular;  Laterality: Right;  . PERIPHERAL VASCULAR CATHETERIZATION N/A 09/20/2014   Procedure: Abdominal Aortogram;  Surgeon: Serafina Mitchell, MD;  Location: [place] [facility];  Service: Cardiovascular;  Laterality: N/A;  . POSTERIOR FUSION LUMBAR SPINE    . TEE WITHOUT CARDIOVERSION N/A 01/12/2018   Procedure: TRANSESOPHAGEAL ECHOCARDIOGRAM (TEE);  Surgeon: Lelon Perla, MD;  Location: [facility] ENDOSCOPY;  Service: Cardiovascular;  Laterality: N/A;    No Known Allergies  Prior to Admission medications   Medication Sig Start Date End Date Taking? Authorizing Provider  albuterol (PROVENTIL HFA;VENTOLIN HFA) 108 (90 Base) MCG/ACT inhaler Inhale 2 puffs into the lungs every 6 (six) hours as needed for wheezing or shortness of breath.   Yes [provider]  albuterol (PROVENTIL) (2.5 MG/3ML) 0.083% nebulizer solution Inhale 3 mLs into the lungs 3 (three) times daily as  needed for shortness of breath or wheezing. 06/23/18  Yes [provider]  BREO ELLIPTA 200-25 MCG/INH AEPB Inhale 1 puff into the lungs daily as needed (for respiratory issues.).  07/27/17  Yes [provider]  calcium acetate (PHOSLO) 667 MG capsule Take 667 mg by mouth 3 (three) times daily with meals. 06/29/18  Yes [provider]  Cholecalciferol (VITAMIN D3) 50 MCG (2000 UT) TABS Take 2,000 Units by mouth daily. 06/28/18  Yes [provider]  clopidogrel (PLAVIX) 75 MG tablet Take 75 mg by mouth daily. 06/28/18  Yes [provider]  diltiazem (CARDIZEM CD) 360 MG 24 hr capsule Take 1 capsule (360 mg total) by mouth daily. 02/09/18  Yes Tat, Shanon Brow, MD  ELIQUIS 2.5 MG TABS tablet Take 2.5 mg by mouth 2  (two) times daily. 06/29/18  Yes [provider]  fluticasone (FLONASE) 50 MCG/ACT nasal spray Place 1 spray into both nostrils daily as needed for allergies.    Yes [provider]  furosemide (LASIX) 80 MG tablet Take 80 mg by mouth 2 (two) times daily. 06/23/18  Yes [provider]  gabapentin (NEURONTIN) 300 MG capsule Take 300 mg by mouth 2 (two) times daily. 07/07/18  Yes [provider]  hydrALAZINE (APRESOLINE) 50 MG tablet Take 50 mg by mouth 3 (three) times daily. 06/28/18  Yes [provider]  isosorbide mononitrate (IMDUR) 60 MG 24 hr tablet Take 60 mg by mouth daily. 07/07/18  Yes [provider]  metoprolol tartrate (LOPRESSOR) 100 MG tablet Take 1 tablet (100 mg total) by mouth 2 (two) times daily. 02/08/18  Yes Tat, Shanon Brow, MD  nitroGLYCERIN (NITROSTAT) 0.4 MG SL tablet Place 0.4 mg under the tongue every 5 (five) minutes x 3 doses as needed for chest pain. 06/30/18  Yes [provider]  oxyCODONE-acetaminophen (PERCOCET) 10-325 MG tablet Take 1 tablet by mouth every 4 (four) hours as needed for pain.   Yes [provider]  pantoprazole (PROTONIX) 40 MG tablet Take 1 tablet (40 mg total) by mouth 2 (two) times daily. Patient taking differently: Take 40 mg by mouth daily.  04/30/17 01/27/26 Yes Shah, Pratik D, DO  sodium bicarbonate 650 MG tablet Take 1 tablet (650 mg total) by mouth daily. Patient taking differently: Take 650 mg by mouth 3 (three) times daily.  04/04/18  Yes Tat, Shanon Brow, MD  traZODone (DESYREL) 50 MG tablet Take 75 mg by mouth at bedtime.    Yes [provider]  cefTRIAXone 2 g in sodium chloride 0.9 % 100 mL Inject 2 g into the vein daily. Last dose 05/26/2018 Patient not taking: Reported on 07/22/2018 04/04/18   Orson Eva, MD  furosemide (LASIX) 40 MG tablet Take 1 tablet (40 mg total) by mouth daily. Patient not taking: Reported on 07/22/2018 02/09/18   Orson Eva, MD  vancomycin (VANCOCIN) 1-5  GM/200ML-% SOLN Inject 200 mLs (1,000 mg total) into the vein every other day. Last dose on 05/26/2018 Patient not taking: Reported on 07/22/2018 04/05/18   Orson Eva, MD  warfarin (COUMADIN) 5 MG tablet Take 1 tablet (5 mg total) by mouth daily at 6 PM. Patient not taking: Reported on 07/22/2018 04/04/18   Orson Eva, MD    Social History   Socioeconomic History  . Marital status: Legally Separated    Spouse name: Not on file  . Number of children: 2  . Years of education: Not on file  . Highest education level: Not on file  Occupational History  .  Occupation: DISABLED  Social Needs  . Financial resource strain: Not on file  . Food insecurity:    Worry: Not on file    Inability: Not on file  . Transportation needs:    Medical: No    Non-medical: No  Tobacco Use  . Smoking status: Current Every Day Smoker    Packs/day: 0.25    Years: 47.00    Pack years: 11.75    Types: Cigarettes    Last attempt to quit: 08/03/2017    Years since quitting: 0.9  . Smokeless tobacco: Never Used  Substance and Sexual Activity  . Alcohol use: Not Currently    Comment: 10/12/2017 alcohol free since 2017,  heavy drinker in the past  . Drug use: Not Currently    Comment: "<2003 whatever was around; nothing since"  . Sexual activity: Not Currently  Lifestyle  . Physical activity:    Days per week: Not on file    Minutes per session: Not on file  . Stress: Not on file  Relationships  . Social connections:    Talks on phone: Not on file    Gets together: Not on file    Attends religious service: Not on file    Active member of club or organization: Not on file    Attends meetings of clubs or organizations: Not on file    Relationship status: Not on file  . Intimate partner violence:    Fear of current or ex partner: Not on file    Emotionally abused: Not on file    Physically abused: Not on file    Forced sexual activity: Not on file  Other Topics Concern  . Not on file  Social History  Narrative   Disabled from Back since 2007     Family History  Problem Relation Age of Onset  . Alcoholism Mother   . Heart disease Father        Massive heart attack  . Heart attack Father   . Atrial fibrillation Father   . Colon cancer Father   . Colon cancer Maternal Grandfather 54  . Alcoholism Maternal Grandfather   . Renal cancer Cousin   . Ovarian cancer Sister     ROS: [x]  Positive   [ ]  Negative   [ ]  All sytems reviewed and are negative  Cardiovascular: []  chest pain/pressure []  palpitations []  SOB lying flat []  DOE []  pain in legs while walking []  pain in legs at rest []  pain in legs at night []  non-healing ulcers []  hx of DVT []  swelling in legs  Pulmonary: []  productive cough []  asthma/wheezing []  home O2  Neurologic: []  weakness in []  arms []  legs []  numbness in []  arms []  legs []  hx of CVA []  mini stroke [] difficulty speaking or slurred speech []  temporary loss of vision in one eye []  dizziness  Hematologic: []  hx of cancer []  bleeding problems []  problems with blood clotting easily  Endocrine:   []  diabetes []  thyroid disease  GI []  vomiting blood []  blood in stool  GU: []  CKD/renal failure []  HD--[]  M/W/F or []  T/T/S []  burning with urination []  blood in urine  Psychiatric: []  anxiety []  depression  Musculoskeletal: []  arthritis []  joint pain  Integumentary: []  rashes []  ulcers  Constitutional: []  fever []  chills   Physical Examination  Vitals:   07/26/18 0652  BP: (!) 159/85  Pulse: 83  Resp: 18  Temp: 98.3 F (36.8 C)  SpO2: 95%   Body mass index  is 22.62 kg/m.  General:  WDWN in NAD Gait: Not observed HENT: WNL, normocephalic Pulmonary: normal non-labored breathing, without Rales, rhonchi,  wheezing Cardiac: regular Abdomen: soft, NT/ND, no masses Vascular Exam/Pulses: Scar from previous RIJ catheter, no catheter in place at this time. Extremities: without ischemic changes, without Gangrene ,  without cellulitis; without open wounds;  Musculoskeletal: no muscle wasting or atrophy  Neurologic: A&O X 3; Appropriate Affect ; SENSATION: normal; MOTOR FUNCTION:  moving all extremities equally. Speech is fluent/normal   CBC    Component Value Date/Time   WBC 7.2 04/04/2018 0623   RBC 3.13 (L) 04/04/2018 0623   HGB 10.2 (L) 07/26/2018 0737   HCT 30.0 (L) 07/26/2018 0737   PLT 217 04/04/2018 0623   MCV 99.0 04/04/2018 0623   MCH 31.9 04/04/2018 0623   MCHC 32.3 04/04/2018 0623   RDW 14.4 04/04/2018 0623   LYMPHSABS 1.1 03/31/2018 1825   MONOABS 0.7 03/31/2018 1825   EOSABS 0.0 03/31/2018 1825   BASOSABS 0.1 03/31/2018 1825    BMET    Component Value Date/Time   NA 136 07/26/2018 0737   K 4.9 07/26/2018 0737   CL 104 04/04/2018 0623   CO2 23 04/04/2018 0623   GLUCOSE 93 07/26/2018 0737   BUN 60 (H) 04/04/2018 0623   CREATININE 4.13 (H) 04/04/2018 0623   CALCIUM 8.3 (L) 04/04/2018 0623   GFRNONAA 15 (L) 04/04/2018 0623   GFRAA 17 (L) 04/04/2018 0623    COAGS: Lab Results  Component Value Date   INR 0.9 07/26/2018   INR 1.66 02/08/2018   INR 1.43 02/07/2018     Non-Invasive Vascular Imaging:     None   ASSESSMENT/PLAN: This is a 63 y.o. male with multiple medical problems that presents for perm cath placement so her can restart dialysis.  Discussed risks and benefits with the patient.  Consent signed.  Marty Heck, MD [facility] Office: [phone] Pager: [place]

## 2018-07-27 ENCOUNTER — Encounter (HOSPITAL_COMMUNITY): Payer: Self-pay | Admitting: Vascular Surgery

## 2018-07-27 NOTE — Transfer of Care (Signed)
Immediate Anesthesia Transfer of Care Note  Patient: Aaron [name]  Procedure(s) Performed: INSERTION OF DIALYSIS CATHETER RIGHT INTERNAL JUGULAR (Right )  Patient Location: PACU  Anesthesia Type:MAC  Level of Consciousness: awake, alert , oriented and sedated  Airway & Oxygen Therapy: Patient Spontanous Breathing and Patient connected to nasal cannula oxygen  Post-op Assessment: Report given to RN, Post -op Vital signs reviewed and stable and Patient moving all extremities  Post vital signs: Reviewed and stable  Last Vitals:  Vitals Value Taken Time  BP    Temp    Pulse    Resp    SpO2      Last Pain:  Vitals:   07/26/18 1217  TempSrc:   PainSc: Asleep      Patients Stated Pain Goal: 3 (95/28/41 3244)  Complications: No apparent anesthesia complications

## 2018-07-27 NOTE — Anesthesia Postprocedure Evaluation (Signed)
Anesthesia Post Note  Patient: Aaron [name]  Procedure(s) Performed: INSERTION OF DIALYSIS CATHETER RIGHT INTERNAL JUGULAR (Right )     Anesthesia Post Evaluation  Last Vitals:  Vitals:   07/26/18 1202 07/26/18 1217  BP: (!) 146/81 (!) 137/92  Pulse: 65 63  Resp: 14 14  Temp:  (!) 36.2 C  SpO2: 96% 99%    Last Pain:  Vitals:   07/26/18 1217  TempSrc:   PainSc: Asleep                 Laurren Lepkowski

## 2018-07-27 NOTE — Anesthesia Postprocedure Evaluation (Signed)
Anesthesia Post Note  Patient: Aaron [name]  Procedure(s) Performed: INSERTION OF DIALYSIS CATHETER RIGHT INTERNAL JUGULAR (Right )     Patient location during evaluation: PACU Anesthesia Type: MAC Level of consciousness: awake Pain management: pain level controlled Vital Signs Assessment: post-procedure vital signs reviewed and stable Respiratory status: spontaneous breathing Cardiovascular status: stable Postop Assessment: no apparent nausea or vomiting Anesthetic complications: no    Last Vitals:  Vitals:   07/26/18 1202 07/26/18 1217  BP: (!) 146/81 (!) 137/92  Pulse: 65 63  Resp: 14 14  Temp:  (!) 36.2 C  SpO2: 96% 99%    Last Pain:  Vitals:   07/26/18 1217  TempSrc:   PainSc: Asleep                 [name]

## 2018-11-26 ENCOUNTER — Encounter (HOSPITAL_COMMUNITY): Payer: Self-pay

## 2018-11-26 ENCOUNTER — Emergency Department (HOSPITAL_COMMUNITY)
Admission: EM | Admit: 2018-11-26 | Discharge: 2018-11-27 | Disposition: A | Discharge status: Other Acute Inpatient Hospital | Payer: 59 | Attending: Emergency Medicine | Admitting: Emergency Medicine

## 2018-11-26 ENCOUNTER — Emergency Department (HOSPITAL_COMMUNITY): Payer: 59

## 2018-11-26 ENCOUNTER — Other Ambulatory Visit: Payer: Self-pay

## 2018-11-26 DIAGNOSIS — R140 Abdominal distension (gaseous): Secondary | ICD-10-CM | POA: Diagnosis not present

## 2018-11-26 DIAGNOSIS — Z20828 Contact with and (suspected) exposure to other viral communicable diseases: Secondary | ICD-10-CM | POA: Insufficient documentation

## 2018-11-26 DIAGNOSIS — Z79899 Other long term (current) drug therapy: Secondary | ICD-10-CM | POA: Insufficient documentation

## 2018-11-26 DIAGNOSIS — I2510 Atherosclerotic heart disease of native coronary artery without angina pectoris: Secondary | ICD-10-CM | POA: Insufficient documentation

## 2018-11-26 DIAGNOSIS — T8241XA Breakdown (mechanical) of vascular dialysis catheter, initial encounter: Secondary | ICD-10-CM | POA: Insufficient documentation

## 2018-11-26 DIAGNOSIS — R109 Unspecified abdominal pain: Secondary | ICD-10-CM | POA: Diagnosis not present

## 2018-11-26 DIAGNOSIS — Y69 Unspecified misadventure during surgical and medical care: Secondary | ICD-10-CM | POA: Insufficient documentation

## 2018-11-26 DIAGNOSIS — N186 End stage renal disease: Secondary | ICD-10-CM | POA: Diagnosis not present

## 2018-11-26 DIAGNOSIS — K652 Spontaneous bacterial peritonitis: Secondary | ICD-10-CM

## 2018-11-26 DIAGNOSIS — J449 Chronic obstructive pulmonary disease, unspecified: Secondary | ICD-10-CM | POA: Insufficient documentation

## 2018-11-26 DIAGNOSIS — R0602 Shortness of breath: Secondary | ICD-10-CM | POA: Diagnosis present

## 2018-11-26 DIAGNOSIS — Z7901 Long term (current) use of anticoagulants: Secondary | ICD-10-CM | POA: Diagnosis not present

## 2018-11-26 DIAGNOSIS — I120 Hypertensive chronic kidney disease with stage 5 chronic kidney disease or end stage renal disease: Secondary | ICD-10-CM | POA: Diagnosis not present

## 2018-11-26 DIAGNOSIS — E8770 Fluid overload, unspecified: Secondary | ICD-10-CM

## 2018-11-26 DIAGNOSIS — F17210 Nicotine dependence, cigarettes, uncomplicated: Secondary | ICD-10-CM | POA: Insufficient documentation

## 2018-11-26 DIAGNOSIS — T85611A Breakdown (mechanical) of intraperitoneal dialysis catheter, initial encounter: Secondary | ICD-10-CM

## 2018-11-26 DIAGNOSIS — Z992 Dependence on renal dialysis: Secondary | ICD-10-CM | POA: Insufficient documentation

## 2018-11-26 LAB — CBC WITH DIFFERENTIAL/PLATELET
Abs Immature Granulocytes: 0.03 10*3/uL (ref 0.00–0.07)
Basophils Absolute: 0.1 10*3/uL (ref 0.0–0.1)
Basophils Relative: 1 %
Eosinophils Absolute: 0.2 10*3/uL (ref 0.0–0.5)
Eosinophils Relative: 2 %
HCT: 34.9 % — ABNORMAL LOW (ref 39.0–52.0)
Hemoglobin: 11.1 g/dL — ABNORMAL LOW (ref 13.0–17.0)
Immature Granulocytes: 0 %
Lymphocytes Relative: 9 %
Lymphs Abs: 0.9 10*3/uL (ref 0.7–4.0)
MCH: 32.4 pg (ref 26.0–34.0)
MCHC: 31.8 g/dL (ref 30.0–36.0)
MCV: 101.7 fL — ABNORMAL HIGH (ref 80.0–100.0)
Monocytes Absolute: 0.6 10*3/uL (ref 0.1–1.0)
Monocytes Relative: 6 %
Neutro Abs: 8.3 10*3/uL — ABNORMAL HIGH (ref 1.7–7.7)
Neutrophils Relative %: 82 %
Platelets: 201 10*3/uL (ref 150–400)
RBC: 3.43 MIL/uL — ABNORMAL LOW (ref 4.22–5.81)
RDW: 16.5 % — ABNORMAL HIGH (ref 11.5–15.5)
WBC: 10.1 10*3/uL (ref 4.0–10.5)
nRBC: 0 % (ref 0.0–0.2)

## 2018-11-26 LAB — CBG MONITORING, ED: Glucose-Capillary: 112 mg/dL — ABNORMAL HIGH (ref 70–99)

## 2018-11-26 MED ORDER — FUROSEMIDE 10 MG/ML IJ SOLN
80.0000 mg | Freq: Once | INTRAMUSCULAR | Status: AC
  Administered 2018-11-26: 23:00:00 80 mg via INTRAVENOUS
  Filled 2018-11-26: qty 8

## 2018-11-26 NOTE — ED Triage Notes (Signed)
EMS called out for difficulty breathing and new swelling to left side after nine hours of home dialysis last night without relief. Pt BP was high upon EMS arrival, pt has not taken BP meds tonight, but did take this morning.

## 2018-11-26 NOTE — ED Provider Notes (Signed)
[facility] EMERGENCY DEPARTMENT Provider Note   CSN: [id number] Arrival date & time: 11/26/18  2127     History   Chief Complaint Chief Complaint  Patient presents with   Shortness of Breath    HPI Aaron [name] is a 63 y.o. male.     Patient with history of COPD, ESRD on peritoneal dialysis, peripheral vascular disease, atrial fibrillation on eliquis presenting with shortness of breath as well as leg swelling onset tonight.  States compliance with his dialysis regimen of 9 hours per night.  States he does make some urine and states compliance with his medications including Lasix.  He endorses increased shortness of breath tonight with swelling in his legs as well as swelling to the left side of his abdomen.  Denies any cough or fever.  No chest pain.  He feels better since getting oxygen by EMS.  He states he first noticed the swelling in his legs and abdomen tonight.  He denies any vomiting, fever or diarrhea.  Still making urine.  No sick contacts.  The history is provided by the patient and the EMS personnel.  Shortness of Breath Associated symptoms: abdominal pain   Associated symptoms: no chest pain, no cough, no fever, no headaches and no vomiting     Past Medical History:  Diagnosis Date   Anxiety    Arthritis    knees , back , shoulders (10/12/2017)   Atrial fibrillation (HCC)    CAD (coronary artery disease)    Mild nonobstructive disease at cardiac catheterization 2002   Chronic back pain    "back of my neck; down thru my legs" (10/12/2017)   COPD (chronic obstructive pulmonary disease) (HCC)    Diverticulitis    Esophageal reflux    ESRD (end stage renal disease) on dialysis ([place])    "TTS; [place]" (11/23/2017)   Essential hypertension    Hepatitis C    states he no longer has this   History of kidney stones    History of syncope    Hyperlipidemia    Jerking 09/23/2014   Myocardial infarction ([place]) 02/2017   "light one" (10/12/2017)    Non-compliant behavior    non compliant with diaylsis per daughter   PAT (paroxysmal atrial tachycardia) ([place])    Peripheral arterial disease ([place])    Occluded left superficial femoral artery status post stent June 2016 - Dr. Trula Slade   Pneumonia 1961   Polycystic kidney, unspecified type    Syncope 09/2014   SYNCOPE 05/07/2010   Qualifier: Diagnosis of  By: Laurance Flatten RN, BSN, Jennifer      Patient Active Problem List   Diagnosis Date Noted   Vertebral osteomyelitis ([place]) 04/02/2018   Back pain 03/31/2018   Noncompliance with medication regimen 03/31/2018   CKD (chronic kidney disease) stage 5, GFR less than 15 ml/min ([place]) 02/03/2018   COPD with acute exacerbation ([place]) 02/03/2018   Atrial fibrillation, chronic 02/03/2018   Discitis 01/27/2018   Discitis of lumbar region 01/09/2018   Paraspinal abscess ([place]) 01/08/2018   ESRD on dialysis ([place]) 12/30/2017   Hematoma of arm, right, initial encounter 11/23/2017   Protein-calorie malnutrition, severe 10/15/2017   Hyperlipidemia 08/24/2017   Schatzki's ring of distal esophagus 04/27/2017   Encounter for therapeutic drug monitoring 03/03/2017   History of hepatitis C, naturally cleared  02/20/2017   Ischemic cardiomyopathy 01/27/2017   Dyslipidemia 01/27/2017   AF (paroxysmal atrial fibrillation) ([place]) 01/27/2017   CAD (coronary artery disease) 01/21/2017   Polycystic kidney disease 12/14/2016  COPD (chronic obstructive pulmonary disease) (HCC)    COLD (chronic obstructive lung disease) (HCC)    Depression    PAD (peripheral artery disease) ([place]) 09/19/2014   Preop cardiovascular exam 10/28/2010   Tobacco abuse 10/28/2010   UNSPECIFIED IRON DEFICIENCY ANEMIA 10/04/2009   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   Essential hypertension 10/04/2009   Esophageal reflux 10/04/2009   PRECORDIAL PAIN 10/04/2009    Past Surgical History:  Procedure Laterality Date   ABDOMINAL AORTOGRAM N/A 08/11/2017    Procedure: ABDOMINAL AORTOGRAM;  Surgeon: Serafina Mitchell, MD;  Location: [place] [facility];  Service: Cardiovascular;  Laterality: N/A;   ANTERIOR CERVICAL DECOMP/DISCECTOMY FUSION     APPLICATION OF WOUND VAC Right 08/14/2017   Procedure: APPLICATION OF PREVENA INCISIONAL WOUND VAC RIGHT GROIN;  Surgeon: Serafina Mitchell, MD;  [facility];  Service: Vascular;  Laterality: Right;   AV FISTULA PLACEMENT Left 09/04/2017   Procedure: ARTERIOVENOUS (AV) FISTULA CREATION LEFT UPPER ARM;  Surgeon: Serafina Mitchell, MD;  Location: [place];  Service: Vascular;  Laterality: Left;   BACK SURGERY     BASCILIC VEIN TRANSPOSITION Left 11/20/2017   Procedure: SECOND STAGE BASILIC VEIN TRANSPOSITION LEFT ARM;  Surgeon: Serafina Mitchell, MD;  Location: [place];  Service: Vascular;  Laterality: Left;   BIOPSY  12/17/2016   Procedure: BIOPSY;  Surgeon: Daneil Dolin, MD;  [facility];  Service: Gastroenterology;;  gastric colon   BIOPSY  04/29/2017   Procedure: BIOPSY;  Surgeon: Daneil Dolin, MD;  [facility];  Service: Endoscopy;;  duodenal biopsies   BUNIONECTOMY Bilateral    COLONOSCOPY  2008   Dr. Oneida Alar: rare sigmoid colon diverticulosis, internal hemorrhoids.    COLONOSCOPY WITH PROPOFOL N/A 12/17/2016   dense left-sided diverticulosis, right colon ulcers s/p biopsy query occult NSAID use vs transient ischemia, not consistent with IBD. CMV stains negative.    ENDARTERECTOMY FEMORAL Right 08/14/2017   Procedure: RIGHT ILLIO-FEMORAL ENDARTERECTOMY;  Surgeon: Serafina Mitchell, MD;  [facility] OR;  Service: Vascular;  Laterality: Right;   ESOPHAGEAL DILATION  12/17/2016   EGD with mild Schatzki's ring s/p dilatation, small hiatal hernia, erosive gastropathy (negative H.pylori gastritis)   ESOPHAGOGASTRODUODENOSCOPY  2008   Dr. Oneida Alar: normal esophagus without Barrett's, antritis and duodenitis, path with H.pylori gastritis   ESOPHAGOGASTRODUODENOSCOPY (EGD) WITH PROPOFOL  N/A 12/17/2016   Procedure: ESOPHAGOGASTRODUODENOSCOPY (EGD) WITH PROPOFOL;  Surgeon: Daneil Dolin, MD;  [facility];  Service: Gastroenterology;  Laterality: N/A;   ESOPHAGOGASTRODUODENOSCOPY (EGD) WITH PROPOFOL N/A 04/29/2017   Patchy erythema of gastric mucosa diffusely, extensive inflammatory changes in duodenum, geographic ulceration and mucosal edema present, encroaching somewhat on the lumen yet still widely patent, distal second portion of duodenum appeared abnormal, path with peptic duodenitis with ulceration   GROIN DEBRIDEMENT Right 10/14/2017   Procedure: RIGHT GROIN AND RIGHT LOWER QUADRANT ABDOMEN DEBRIDEMENT WITH PLACEMENT OF ANTIBIOTIC BEADS;  Surgeon: Serafina Mitchell, MD;  Location: [place];  Service: Vascular;  Laterality: Right;   HEMATOMA EVACUATION Left 12/06/2017   Procedure: EVACUATION HEMATOMA;  Surgeon: Waynetta Sandy, MD;  Location: [place];  Service: Vascular;  Laterality: Left;   HERNIA REPAIR  [id number]   umbilical   I&D EXTREMITY Right 09/21/2017   Procedure: IRRIGATION AND DEBRIDEMENT GROIN;  Surgeon: Elam Dutch, MD;  Location: [place];  Service: Vascular;  Laterality: Right;   I&D EXTREMITY Left 11/23/2017   Procedure: Evacuation Hematoma LEFT UPPER ARM GRAFT;  Surgeon: Oneida Alar,  Jessy Oto, MD;  Location: [place];  Service: Vascular;  Laterality: Left;   INGUINAL HERNIA REPAIR Right 02/2017   [place]   INSERTION OF DIALYSIS CATHETER Right 09/04/2017   Procedure: INSERTION OF TUNNELED  DIALYSIS CATHETER - RIGHT INTERNAL JUGULAR PLACEMENT;  Surgeon: Serafina Mitchell, MD;  [facility];  Service: Vascular;  Laterality: Right;   INSERTION OF DIALYSIS CATHETER Right 07/26/2018   Procedure: INSERTION OF DIALYSIS CATHETER RIGHT INTERNAL JUGULAR;  Surgeon: Marty Heck, MD;  Location: [place];  Service: Vascular;  Laterality: Right;   INSERTION OF ILIAC STENT Right 08/14/2017   Procedure: INSERTION OF RIGHT COMMON ILIAC STENT 34mm x 55mm x 130cm  INSERTION OF RIGHT EXTERNAL ILIAC STENT 69mm x 32mm x 130cm INSERTION OF SUPERFICIAL FERMORAL ARTERY STENT 48mm x 29mm x 130cm;  Surgeon: Serafina Mitchell, MD;  Location: [facility] OR;  Service: Vascular;  Laterality: Right;   IR FLUORO GUIDE CV LINE RIGHT  08/31/2017   IR REMOVAL TUN CV CATH W/O FL  05/28/2018   IR US GUIDE VASC ACCESS RIGHT  08/31/2017   LOWER EXTREMITY ANGIOGRAPHY Right 08/11/2017   Procedure: LOWER EXTREMITY ANGIOGRAPHY;  Surgeon: Serafina Mitchell, MD;  Location: [place] [facility];  Service: Cardiovascular;  Laterality: Right;   PATCH ANGIOPLASTY Right 08/14/2017   Procedure: PATCH ANGIOPLASTY USING HEMASHIELD PATCH 0.3IN Lillie Columbia;  Surgeon: Serafina Mitchell, MD;  [facility];  Service: Vascular;  Laterality: Right;   PERIPHERAL VASCULAR CATHETERIZATION N/A 09/20/2014   Procedure: Abdominal Aortogram;  Surgeon: Serafina Mitchell, MD;  Location: [place] [facility];  Service: Cardiovascular;  Laterality: N/A;   POSTERIOR FUSION LUMBAR SPINE     TEE WITHOUT CARDIOVERSION N/A 01/12/2018   Procedure: TRANSESOPHAGEAL ECHOCARDIOGRAM (TEE);  Surgeon: Lelon Perla, MD;  Location: [facility] ENDOSCOPY;  Service: Cardiovascular;  Laterality: N/A;        Home Medications    Prior to Admission medications   Medication Sig Start Date End Date Taking? Authorizing Provider  albuterol (PROVENTIL HFA;VENTOLIN HFA) 108 (90 Base) MCG/ACT inhaler Inhale 2 puffs into the lungs every 6 (six) hours as needed for wheezing or shortness of breath.    [provider]  albuterol (PROVENTIL) (2.5 MG/3ML) 0.083% nebulizer solution Inhale 3 mLs into the lungs 3 (three) times daily as needed for shortness of breath or wheezing. 06/23/18   [provider]  BREO ELLIPTA 200-25 MCG/INH AEPB Inhale 1 puff into the lungs daily as needed (for respiratory issues.).  07/27/17   [provider]  calcium acetate (PHOSLO) 667 MG capsule Take 667 mg by mouth 3 (three) times daily with meals. 06/29/18    [provider]  cefTRIAXone 2 g in sodium chloride 0.9 % 100 mL Inject 2 g into the vein daily. Last dose 05/26/2018 Patient not taking: Reported on 07/22/2018 04/04/18   TatShanon Brow, MD  Cholecalciferol (VITAMIN D3) 50 MCG (2000 UT) TABS Take 2,000 Units by mouth daily. 06/28/18   [provider]  clopidogrel (PLAVIX) 75 MG tablet Take 75 mg by mouth daily. 06/28/18   [provider]  diltiazem (CARDIZEM CD) 360 MG 24 hr capsule Take 1 capsule (360 mg total) by mouth daily. 02/09/18   Orson Eva, MD  ELIQUIS 2.5 MG TABS tablet Take 2.5 mg by mouth 2 (two) times daily. 06/29/18   [provider]  fluticasone (FLONASE) 50 MCG/ACT nasal spray Place 1 spray into both nostrils daily as needed for allergies.  [provider]  furosemide (LASIX) 40 MG tablet Take 1 tablet (40 mg total) by mouth daily. Patient not taking: Reported on 07/22/2018 02/09/18   Orson Eva, MD  furosemide (LASIX) 80 MG tablet Take 80 mg by mouth 2 (two) times daily. 06/23/18   [provider]  gabapentin (NEURONTIN) 300 MG capsule Take 300 mg by mouth 2 (two) times daily. 07/07/18   [provider]  hydrALAZINE (APRESOLINE) 50 MG tablet Take 50 mg by mouth 3 (three) times daily. 06/28/18   [provider]  HYDROcodone-acetaminophen (NORCO/VICODIN) 5-325 MG tablet Take 1 tablet by mouth every 4 (four) hours as needed for moderate pain. 07/26/18 07/26/19  Marty Heck, MD  isosorbide mononitrate (IMDUR) 60 MG 24 hr tablet Take 60 mg by mouth daily. 07/07/18   [provider]  metoprolol tartrate (LOPRESSOR) 100 MG tablet Take 1 tablet (100 mg total) by mouth 2 (two) times daily. 02/08/18   Orson Eva, MD  nitroGLYCERIN (NITROSTAT) 0.4 MG SL tablet Place 0.4 mg under the tongue every 5 (five) minutes x 3 doses as needed for chest pain. 06/30/18   [provider]  oxyCODONE-acetaminophen (PERCOCET) 10-325 MG tablet Take 1 tablet by mouth every 4  (four) hours as needed for pain.    [provider]  pantoprazole (PROTONIX) 40 MG tablet Take 1 tablet (40 mg total) by mouth 2 (two) times daily. Patient taking differently: Take 40 mg by mouth daily.  04/30/17 01/27/26  Manuella Ghazi, Pratik D, DO  sodium bicarbonate 650 MG tablet Take 1 tablet (650 mg total) by mouth daily. Patient taking differently: Take 650 mg by mouth 3 (three) times daily.  04/04/18   Orson Eva, MD  traZODone (DESYREL) 50 MG tablet Take 75 mg by mouth at bedtime.     [provider]  vancomycin (VANCOCIN) 1-5 GM/200ML-% SOLN Inject 200 mLs (1,000 mg total) into the vein every other day. Last dose on 05/26/2018 Patient not taking: Reported on 07/22/2018 04/05/18   Orson Eva, MD  warfarin (COUMADIN) 5 MG tablet Take 1 tablet (5 mg total) by mouth daily at 6 PM. Patient not taking: Reported on 07/22/2018 04/04/18   Orson Eva, MD    Family History Family History  Problem Relation Age of Onset   Alcoholism Mother    Heart disease Father        Massive heart attack   Heart attack Father    Atrial fibrillation Father    Colon cancer Father    Colon cancer Maternal Grandfather 39   Alcoholism Maternal Grandfather    Renal cancer Cousin    Ovarian cancer Sister     Social History Social History   Tobacco Use   Smoking status: Current Every Day Smoker    Packs/day: 0.25    Years: 47.00    Pack years: 11.75    Types: Cigarettes    Last attempt to quit: 08/03/2017    Years since quitting: 1.3   Smokeless tobacco: Never Used  Substance Use Topics   Alcohol use: Not Currently    Comment: 10/12/2017 alcohol free since 2017,  heavy drinker in the past   Drug use: Not Currently    Comment: "<2003 whatever was around; nothing since"     Allergies   Patient has no known allergies.   Review of Systems Review of Systems  Constitutional: Negative for activity change, appetite change, fatigue and fever.  HENT: Negative for congestion.    Respiratory: Positive for shortness of breath. Negative  for cough and chest tightness.   Cardiovascular: Positive for leg swelling. Negative for chest pain.  Gastrointestinal: Positive for abdominal distention and abdominal pain. Negative for nausea and vomiting.  Genitourinary: Negative for dysuria and hematuria.  Neurological: Negative for dizziness, weakness and headaches.    all other systems are negative except as noted in the HPI and PMH.    Physical Exam Updated Vital Signs BP (!) 180/99    Pulse 74    Resp 13    Ht 6\' 4"  (1.93 m)    Wt 82.1 kg    SpO2 99%    BMI 22.03 kg/m   Physical Exam Vitals signs and nursing note reviewed.  Constitutional:      General: He is in acute distress.     Appearance: He is well-developed.     Comments: Mild dyspnea with conversation  HENT:     Head: Normocephalic and atraumatic.     Mouth/Throat:     Pharynx: No oropharyngeal exudate.  Eyes:     Conjunctiva/sclera: Conjunctivae normal.     Pupils: Pupils are equal, round, and reactive to light.  Neck:     Musculoskeletal: Normal range of motion and neck supple.     Comments: No meningismus. Cardiovascular:     Rate and Rhythm: Normal rate and regular rhythm.     Heart sounds: Normal heart sounds. No murmur.  Pulmonary:     Effort: Pulmonary effort is normal. No respiratory distress.     Breath sounds: Rales present.     Comments: Diminished breath sounds with scattered rhonchi Abdominal:     General: There is distension.     Palpations: Abdomen is soft.     Tenderness: There is abdominal tenderness. There is no guarding or rebound.     Comments: Fluid present.  Peritoneal dialysis catheter in place. Asymmetric swelling to left side of the abdomen.  Musculoskeletal: Normal range of motion.        General: No tenderness.     Right lower leg: Edema present.     Left lower leg: Edema present.  Skin:    General: Skin is warm.     Capillary Refill: Capillary refill takes less than 2  seconds.  Neurological:     General: No focal deficit present.     Mental Status: He is alert and oriented to person, place, and time. Mental status is at baseline.     Cranial Nerves: No cranial nerve deficit.     Motor: No abnormal muscle tone.     Coordination: Coordination normal.     Comments: No ataxia on finger to nose bilaterally. No pronator drift. 5/5 strength throughout. CN 2-12 intact.Equal grip strength. Sensation intact.   Psychiatric:        Behavior: Behavior normal.      ED Treatments / Results  Labs (all labs ordered are listed, but only abnormal results are displayed) Labs Reviewed  CBC WITH DIFFERENTIAL/PLATELET - Abnormal; Notable for the following components:      Result Value   RBC 3.43 (*)    Hemoglobin 11.1 (*)    HCT 34.9 (*)    MCV 101.7 (*)    RDW 16.5 (*)    Neutro Abs 8.3 (*)    All other components within normal limits  COMPREHENSIVE METABOLIC PANEL - Abnormal; Notable for the following components:   Glucose, Bld 107 (*)    BUN 36 (*)    Creatinine, Ser 5.94 (*)    Calcium 7.9 (*)  Total Protein 5.6 (*)    Albumin 2.6 (*)    GFR calc non Af Amer 9 (*)    GFR calc Af Amer 11 (*)    All other components within normal limits  BRAIN NATRIURETIC PEPTIDE - Abnormal; Notable for the following components:   B Natriuretic Peptide 1,136.0 (*)    All other components within normal limits  BODY FLUID CELL COUNT WITH DIFFERENTIAL - Abnormal; Notable for the following components:   Appearance, Fluid HAZY (*)    WBC, Fluid 4,023 (*)    Neutrophil Count, Fluid 59 (*)    Monocyte-Macrophage-Serous Fluid 25 (*)    All other components within normal limits  CBG MONITORING, ED - Abnormal; Notable for the following components:   Glucose-Capillary 112 (*)    All other components within normal limits  TROPONIN I (HIGH SENSITIVITY) - Abnormal; Notable for the following components:   Troponin I (High Sensitivity) 18 (*)    All other components within normal  limits  SARS CORONAVIRUS 2 (HOSPITAL ORDER, [place] [facility])  GRAM STAIN  CULTURE, BLOOD (ROUTINE X 2)  CULTURE, BLOOD (ROUTINE X 2)  CULTURE, BODY FLUID-BOTTLE  LACTIC ACID, PLASMA  SYNOVIAL CELL COUNT + DIFF, W/ CRYSTALS  GLUCOSE, BODY FLUID OTHER  PATHOLOGIST SMEAR REVIEW  TROPONIN I (HIGH SENSITIVITY)    EKG EKG Interpretation  Date/Time:  Friday November 26 2018 21:45:30 EDT Ventricular Rate:  77 PR Interval:    QRS Duration: 93 QT Interval:  418 QTC Calculation: 474 R Axis:   -46 Text Interpretation:  Sinus rhythm Atrial premature complex Left anterior fascicular block Anterior infarct, old Minimal ST depression, lateral leads No significant change was found Confirmed by Ezequiel Essex [phone]) on 11/26/2018 11:19:22 PM   Radiology Ct Abdomen Pelvis Wo Contrast  Result Date: 11/27/2018 CLINICAL DATA:  Abdominal distension, peritoneal dialysis patient. Left lower quadrant pain. EXAM: CT ABDOMEN AND PELVIS WITHOUT CONTRAST TECHNIQUE: Multidetector CT imaging of the abdomen and pelvis was performed following the standard protocol without IV contrast. COMPARISON:  CT 03/31/2018 FINDINGS: Lower chest: Left pleural effusion at least moderate in size. Adjacent compressive atelectasis. Small right pleural effusion not elect assess. Heart is normal in size. Decreased pericardial effusion from prior. Hepatobiliary: Numerous hepatic cysts. Gallbladder physiologically distended, no calcified stone. No biliary dilatation. Pancreas: No ductal dilatation or inflammation. Spleen: Normal in size without focal abnormality. Adrenals/Urinary Tract: No adrenal nodule. Sequela of autosomal dominant polycystic kidney disease with cystic replacement enlargement of both kidneys. Renal cysts are both simple and hemorrhagic, not significantly changed from prior exam. Scattered nonobstructing calculi in both kidneys. Both ureters are decompressed without stones along the course.  Urinary bladder is minimally distended, mildly thick-walled. Stomach/Bowel: Bowel evaluation is limited in the absence of enteric contrast. Stomach is unremarkable. No evidence of obstruction or significant inflammation. Normal appendix. Peritoneal dialysis catheter in place. Catheter appears to traverse the transverse colon, for example series 2, image 47. No associated free air. Moderate stool burden in the colon. Distal colonic diverticulosis without diverticulitis. Vascular/Lymphatic: Advanced aorta bi-iliac atherosclerosis. Ectatic infrarenal aorta at 2.8 cm. No periaortic stranding. Right femoral arterial stent partially visualized. No enlarged lymph nodes in the abdomen or pelvis. Reproductive: Prostate is unremarkable. Other: Peritoneal dialysis catheter in place, possibly traversing the transverse colon as described above. Catheter tip is within the pelvis. Small amount of free fluid in the pelvisj measures simple fluid density and appears non loculated. There is 7free fluid in the left pericolic gutter,  though this is difficult to differentiate from adjacent bowel loops. There is subcutaneous fluid localizing to the left lateral abdominal wall with associated skin thickening. Small left lateral lumbar hernia containing fat. Musculoskeletal: Sequela of prior discitis osteomyelitis at L1-L2 with slight interval bony fusion. Multilevel degenerative and postsurgical change in the remainder of the lumbar spine. Degenerative change involving the hips, right greater than left. IMPRESSION: 1. Peritoneal dialysis catheter which appears to traverse the transverse colon. Recommend surgical consultation. No evidence of free air. 2. Small amount of free fluid in the pelvis. Fluid in the left pericolic gutter is difficult to delineate from adjacent bowel loops. Subcutaneous fluid in the left flank and associated skin thickening. 3. Left pleural effusion at least moderate in size. Small right pleural effusion. 4. Sequela  of autosomal dominant polycystic kidney disease with enlargement and cystic replacement of both kidneys. Bilateral nonobstructing renal calculi. 5. Colonic diverticulosis without diverticulitis. 6.  Aortic Atherosclerosis (ICD10-I70.0). Aortic Atherosclerosis (ICD10-I70.0). Electronically Signed   By: Keith Rake M.D.   On: 11/27/2018 01:54   Dg Chest Portable 1 View  Result Date: 11/27/2018 CLINICAL DATA:  63 year old male with shortness of breath. EXAM: PORTABLE CHEST 1 VIEW COMPARISON:  Chest radiograph dated 09/19/2018 FINDINGS: Interval removal of the dialysis catheter. Probable small bilateral pleural effusions, left greater than right with associated mild bibasilar atelectasis. Infiltrate is not excluded. Clinical correlation is recommended. There is no pneumothorax. Stable mild cardiomegaly. Atherosclerotic calcification of the aorta. No acute osseous pathology. IMPRESSION: Small bilateral pleural effusions and bibasilar atelectasis. Electronically Signed   By: Anner Crete M.D.   On: 11/27/2018 00:37    Procedures Procedures (including critical care time)  Medications Ordered in ED Medications  cefTAZidime (FORTAZ) 2 g in sodium chloride 0.9 % 100 mL IVPB (2 g Intravenous New Bag/Given 11/27/18 0218)  furosemide (LASIX) injection 80 mg (80 mg Intravenous Given 11/26/18 2322)  fentaNYL (SUBLIMAZE) injection 50 mcg (50 mcg Intravenous Given 11/27/18 0037)     Initial Impression / Assessment and Plan / ED Course  I have reviewed the triage vital signs and the nursing notes.  Pertinent labs & imaging results that were available during my care of the patient were reviewed by me and considered in my medical decision making (see chart for details).       Peritoneal dialysis patient presenting with shortness of breath as well as abdominal distention.  No fever.  Abdomen distended without peritoneal signs.  No fever.  Chest x-ray shows bilateral pleural effusions and  atelectasis.  IV Lasix given.  Peritoneal fluid is concerning for SBP with 4000 PMNs. Given IV ceftazidime after cultures obtained.  CT scan is concerning for peritoneal dialysis catheter going through transverse colon.  Discussed with Dr. Constance Haw of general surgery who evaluated the images and agrees.  She states patient needs to be transferred where his catheter was placed.  Patient states he was placed at [facility] about 3 months ago.  It appears the catheter location has been in this position for about 3 months.  There is no free air on the CT scan.  D/w [facility] transfer line.   D/w [facility] nephrology Dr. Toy Cookey.  Catheter was apparently placed by Dr. Kathrine Cords who is an interventional nephrologist.  Dr. Toy Cookey agrees to evaluate patient and recommends transfer to the ED for coordination of care and to determine appropriate bed location.  Discussed with the ED doctor Migliaccio who accepts patient to the ED but wishes surgery to be contacted as well.  Discussed with surgery Dr. Juanda Crumble who agrees to evaluate patient in the ED.  Patient did receive broad-spectrum antibiotics remains hemodynamically stable.  Discussed with patient who is agreeable to transfer.  Remains hemodynamically stable resting comfortably on nasal cannula.  He is diuresing well with IV Lasix. He received broad-spectrum antibiotics for his peritonitis.  He is awaiting transfer to [facility] ED.  CRITICAL CARE Performed by: Ezequiel Essex Total critical care time: 90 minutes Critical care time was exclusive of separately billable procedures and treating other patients. Critical care was necessary to treat or prevent imminent or life-threatening deterioration. Critical care was time spent personally by me on the following activities: development of treatment plan with patient and/or surrogate as well as nursing, discussions with consultants, evaluation of patient's response to treatment, examination of patient, obtaining history from  patient or surrogate, ordering and performing treatments and interventions, ordering and review of laboratory studies, ordering and review of radiographic studies, pulse oximetry and re-evaluation of patient's condition.   Aaron [name] was evaluated in Emergency Department on 11/27/2018 for the symptoms described in the history of present illness. He was evaluated in the context of the global COVID-19 pandemic, which necessitated consideration that the patient might be at risk for infection with the SARS-CoV-2 virus that causes COVID-19. Institutional protocols and algorithms that pertain to the evaluation of patients at risk for COVID-19 are in a state of rapid change based on information released by regulatory bodies including the CDC and federal and state organizations. These policies and algorithms were followed during the patient's care in the ED.    Final Clinical Impressions(s) / ED Diagnoses   Final diagnoses:  Peritoneal dialysis catheter dysfunction, initial encounter ([place])  SBP (spontaneous bacterial peritonitis) ([place])  Hypervolemia, unspecified hypervolemia type    ED Discharge Orders    None       Mitchelle Goerner, Annie Main, MD 11/27/18 [phone]

## 2018-11-27 ENCOUNTER — Emergency Department (HOSPITAL_COMMUNITY): Payer: 59

## 2018-11-27 DIAGNOSIS — T8241XA Breakdown (mechanical) of vascular dialysis catheter, initial encounter: Secondary | ICD-10-CM | POA: Diagnosis not present

## 2018-11-27 LAB — COMPREHENSIVE METABOLIC PANEL
ALT: 12 U/L (ref 0–44)
AST: 17 U/L (ref 15–41)
Albumin: 2.6 g/dL — ABNORMAL LOW (ref 3.5–5.0)
Alkaline Phosphatase: 70 U/L (ref 38–126)
Anion gap: 11 (ref 5–15)
BUN: 36 mg/dL — ABNORMAL HIGH (ref 8–23)
CO2: 26 mmol/L (ref 22–32)
Calcium: 7.9 mg/dL — ABNORMAL LOW (ref 8.9–10.3)
Chloride: 105 mmol/L (ref 98–111)
Creatinine, Ser: 5.94 mg/dL — ABNORMAL HIGH (ref 0.61–1.24)
GFR calc Af Amer: 11 mL/min — ABNORMAL LOW (ref >60)
GFR calc non Af Amer: 9 mL/min — ABNORMAL LOW (ref >60)
Glucose, Bld: 107 mg/dL — ABNORMAL HIGH (ref 70–99)
Potassium: 4 mmol/L (ref 3.5–5.1)
Sodium: 142 mmol/L (ref 135–145)
Total Bilirubin: 0.5 mg/dL (ref 0.3–1.2)
Total Protein: 5.6 g/dL — ABNORMAL LOW (ref 6.5–8.1)

## 2018-11-27 LAB — BODY FLUID CELL COUNT WITH DIFFERENTIAL
Eos, Fluid: 0 %
Lymphs, Fluid: 16 %
Monocyte-Macrophage-Serous Fluid: 25 % — ABNORMAL LOW (ref 50–90)
Neutrophil Count, Fluid: 59 % — ABNORMAL HIGH (ref 0–25)
Other Cells, Fluid: 0 %
Total Nucleated Cell Count, Fluid: 4023 cu mm — ABNORMAL HIGH (ref 0–1000)

## 2018-11-27 LAB — TROPONIN I (HIGH SENSITIVITY)
Troponin I (High Sensitivity): 16 ng/L (ref <18)
Troponin I (High Sensitivity): 18 ng/L — ABNORMAL HIGH (ref <18)

## 2018-11-27 LAB — BRAIN NATRIURETIC PEPTIDE: B Natriuretic Peptide: 1136 pg/mL — ABNORMAL HIGH (ref 0.0–100.0)

## 2018-11-27 LAB — GRAM STAIN: Gram Stain: NONE SEEN

## 2018-11-27 LAB — SARS CORONAVIRUS 2 BY RT PCR (HOSPITAL ORDER, PERFORMED IN ~~LOC~~ HOSPITAL LAB): SARS Coronavirus 2: NEGATIVE

## 2018-11-27 LAB — LACTIC ACID, PLASMA: Lactic Acid, Venous: 0.8 mmol/L (ref 0.5–1.9)

## 2018-11-27 MED ORDER — METOPROLOL TARTRATE 50 MG PO TABS
100.00 | ORAL_TABLET | ORAL | Status: DC
Start: 2018-11-28 — End: 2018-11-27

## 2018-11-27 MED ORDER — GABAPENTIN 100 MG PO CAPS
100.00 | ORAL_CAPSULE | ORAL | Status: DC
Start: 2018-11-28 — End: 2018-11-27

## 2018-11-27 MED ORDER — HEPARIN SODIUM (PORCINE) 5000 UNIT/ML IJ SOLN
5000.00 | INTRAMUSCULAR | Status: DC
Start: 2018-11-28 — End: 2018-11-27

## 2018-11-27 MED ORDER — PANTOPRAZOLE SODIUM 40 MG PO TBEC
40.00 | DELAYED_RELEASE_TABLET | ORAL | Status: DC
Start: 2018-11-29 — End: 2018-11-27

## 2018-11-27 MED ORDER — GENERIC EXTERNAL MEDICATION
Status: DC
Start: ? — End: 2018-11-27

## 2018-11-27 MED ORDER — ALBUTEROL SULFATE (2.5 MG/3ML) 0.083% IN NEBU
2.50 | INHALATION_SOLUTION | RESPIRATORY_TRACT | Status: DC
Start: ? — End: 2018-11-27

## 2018-11-27 MED ORDER — FLUTICASONE FUROATE-VILANTEROL 200-25 MCG/INH IN AEPB
1.00 | INHALATION_SPRAY | RESPIRATORY_TRACT | Status: DC
Start: 2018-11-29 — End: 2018-11-27

## 2018-11-27 MED ORDER — FENTANYL CITRATE (PF) 100 MCG/2ML IJ SOLN
50.0000 ug | Freq: Once | INTRAMUSCULAR | Status: AC
  Administered 2018-11-27: 50 ug via INTRAVENOUS
  Filled 2018-11-27: qty 2

## 2018-11-27 MED ORDER — FENTANYL CITRATE (PF) 100 MCG/2ML IJ SOLN
50.0000 ug | INTRAMUSCULAR | Status: DC | PRN
  Administered 2018-11-27: 07:00:00 50 ug via INTRAVENOUS
  Filled 2018-11-27: qty 2

## 2018-11-27 MED ORDER — METOPROLOL TARTRATE 50 MG PO TABS
100.0000 mg | ORAL_TABLET | Freq: Two times a day (BID) | ORAL | Status: DC
  Administered 2018-11-27: 06:00:00 100 mg via ORAL
  Filled 2018-11-27: qty 2

## 2018-11-27 MED ORDER — CHOLECALCIFEROL 25 MCG (1000 UT) PO TABS
2000.00 | ORAL_TABLET | ORAL | Status: DC
Start: 2018-11-29 — End: 2018-11-27

## 2018-11-27 MED ORDER — HYDRALAZINE HCL 50 MG PO TABS
50.00 | ORAL_TABLET | ORAL | Status: DC
Start: 2018-11-28 — End: 2018-11-27

## 2018-11-27 MED ORDER — CLOPIDOGREL BISULFATE 75 MG PO TABS
75.0000 mg | ORAL_TABLET | Freq: Every day | ORAL | Status: DC
  Administered 2018-11-27: 06:00:00 75 mg via ORAL
  Filled 2018-11-27: qty 1

## 2018-11-27 MED ORDER — DILTIAZEM HCL ER COATED BEADS 120 MG PO CP24
360.00 | ORAL_CAPSULE | ORAL | Status: DC
Start: 2018-11-29 — End: 2018-11-27

## 2018-11-27 MED ORDER — CALCIUM ACETATE (PHOS BINDER) 667 MG PO CAPS
667.00 | ORAL_CAPSULE | ORAL | Status: DC
Start: 2018-11-28 — End: 2018-11-27

## 2018-11-27 MED ORDER — SODIUM BICARBONATE 650 MG PO TABS
650.00 | ORAL_TABLET | ORAL | Status: DC
Start: 2018-11-28 — End: 2018-11-27

## 2018-11-27 MED ORDER — PIPERACILLIN-TAZOBACTAM 3.375 G IVPB 30 MIN
3.3750 g | Freq: Three times a day (TID) | INTRAVENOUS | Status: DC
  Administered 2018-11-27: 07:00:00 3.375 g via INTRAVENOUS
  Filled 2018-11-27: qty 50

## 2018-11-27 MED ORDER — GENERIC EXTERNAL MEDICATION
1.00 | Status: DC
Start: 2018-11-28 — End: 2018-11-27

## 2018-11-27 MED ORDER — SODIUM CHLORIDE 0.9 % IV SOLN
2.0000 g | Freq: Once | INTRAVENOUS | Status: AC
  Administered 2018-11-27: 02:00:00 2 g via INTRAVENOUS
  Filled 2018-11-27: qty 2

## 2018-11-27 MED ORDER — DILTIAZEM HCL ER COATED BEADS 180 MG PO CP24
360.0000 mg | ORAL_CAPSULE | Freq: Every day | ORAL | Status: DC
  Administered 2018-11-27: 07:00:00 360 mg via ORAL
  Filled 2018-11-27: qty 1
  Filled 2018-11-27: qty 2
  Filled 2018-11-27 (×2): qty 1

## 2018-11-27 MED ORDER — PREDNISONE 10 MG PO TABS
10.00 | ORAL_TABLET | ORAL | Status: DC
Start: 2018-11-29 — End: 2018-11-27

## 2018-11-27 MED ORDER — HYDRALAZINE HCL 25 MG PO TABS
50.0000 mg | ORAL_TABLET | Freq: Three times a day (TID) | ORAL | Status: DC
  Administered 2018-11-27: 06:00:00 50 mg via ORAL
  Filled 2018-11-27: qty 2

## 2018-11-27 NOTE — ED Notes (Signed)
Ambulated around nurse station. tolerated well.   o2 sats were 98%. Pt did c/o some chest pressure.

## 2018-11-27 NOTE — ED Notes (Signed)
Patient transported to CT 

## 2018-11-27 NOTE — ED Notes (Signed)
Dr Wyvonnia Dusky aware of high BP

## 2018-11-28 LAB — GLUCOSE, BODY FLUID OTHER: Glucose, Body Fluid Other: 245 mg/dL

## 2018-11-28 MED ORDER — FUROSEMIDE 10 MG/ML IJ SOLN
80.00 | INTRAMUSCULAR | Status: DC
Start: 2018-11-29 — End: 2018-11-28

## 2018-11-28 MED ORDER — GENERIC EXTERNAL MEDICATION
Status: DC
Start: ? — End: 2018-11-28

## 2018-11-29 LAB — PATHOLOGIST SMEAR REVIEW

## 2018-12-02 LAB — CULTURE, BLOOD (ROUTINE X 2)
Culture: NO GROWTH
Culture: NO GROWTH
Special Requests: ADEQUATE
Special Requests: ADEQUATE

## 2018-12-02 LAB — CULTURE, BODY FLUID W GRAM STAIN -BOTTLE
Culture: NO GROWTH
Special Requests: ADEQUATE

## 2018-12-13 ENCOUNTER — Other Ambulatory Visit: Payer: Self-pay

## 2018-12-13 DIAGNOSIS — I739 Peripheral vascular disease, unspecified: Secondary | ICD-10-CM

## 2018-12-15 ENCOUNTER — Telehealth (HOSPITAL_COMMUNITY): Payer: Self-pay | Admitting: Rehabilitation

## 2018-12-15 NOTE — Telephone Encounter (Signed)

## 2018-12-16 ENCOUNTER — Ambulatory Visit: Payer: 59 | Admitting: Family

## 2018-12-16 ENCOUNTER — Inpatient Hospital Stay (HOSPITAL_COMMUNITY): Admission: RE | Admit: 2018-12-16 | Payer: 59 | Source: Ambulatory Visit

## 2018-12-22 ENCOUNTER — Encounter (HOSPITAL_COMMUNITY): Payer: Self-pay | Admitting: *Deleted

## 2018-12-22 ENCOUNTER — Emergency Department (HOSPITAL_COMMUNITY): Payer: 59

## 2018-12-22 ENCOUNTER — Emergency Department (HOSPITAL_COMMUNITY)
Admission: EM | Admit: 2018-12-22 | Discharge: 2018-12-22 | Disposition: A | Discharge status: Home / Self Care | Payer: 59 | Attending: Emergency Medicine | Admitting: Emergency Medicine

## 2018-12-22 ENCOUNTER — Other Ambulatory Visit: Payer: Self-pay

## 2018-12-22 DIAGNOSIS — Z992 Dependence on renal dialysis: Secondary | ICD-10-CM | POA: Diagnosis not present

## 2018-12-22 DIAGNOSIS — N186 End stage renal disease: Secondary | ICD-10-CM | POA: Diagnosis not present

## 2018-12-22 DIAGNOSIS — J449 Chronic obstructive pulmonary disease, unspecified: Secondary | ICD-10-CM | POA: Insufficient documentation

## 2018-12-22 DIAGNOSIS — I120 Hypertensive chronic kidney disease with stage 5 chronic kidney disease or end stage renal disease: Secondary | ICD-10-CM | POA: Diagnosis not present

## 2018-12-22 DIAGNOSIS — R0602 Shortness of breath: Secondary | ICD-10-CM

## 2018-12-22 DIAGNOSIS — Z20828 Contact with and (suspected) exposure to other viral communicable diseases: Secondary | ICD-10-CM | POA: Diagnosis not present

## 2018-12-22 DIAGNOSIS — I2510 Atherosclerotic heart disease of native coronary artery without angina pectoris: Secondary | ICD-10-CM | POA: Diagnosis not present

## 2018-12-22 DIAGNOSIS — F17210 Nicotine dependence, cigarettes, uncomplicated: Secondary | ICD-10-CM | POA: Insufficient documentation

## 2018-12-22 LAB — CBC WITH DIFFERENTIAL/PLATELET
Abs Immature Granulocytes: 0.04 10*3/uL (ref 0.00–0.07)
Basophils Absolute: 0.1 10*3/uL (ref 0.0–0.1)
Basophils Relative: 1 %
Eosinophils Absolute: 0.2 10*3/uL (ref 0.0–0.5)
Eosinophils Relative: 2 %
HCT: 28.1 % — ABNORMAL LOW (ref 39.0–52.0)
Hemoglobin: 9.2 g/dL — ABNORMAL LOW (ref 13.0–17.0)
Immature Granulocytes: 1 %
Lymphocytes Relative: 9 %
Lymphs Abs: 0.8 10*3/uL (ref 0.7–4.0)
MCH: 32.6 pg (ref 26.0–34.0)
MCHC: 32.7 g/dL (ref 30.0–36.0)
MCV: 99.6 fL (ref 80.0–100.0)
Monocytes Absolute: 0.6 10*3/uL (ref 0.1–1.0)
Monocytes Relative: 7 %
Neutro Abs: 6.9 10*3/uL (ref 1.7–7.7)
Neutrophils Relative %: 80 %
Platelets: 248 10*3/uL (ref 150–400)
RBC: 2.82 MIL/uL — ABNORMAL LOW (ref 4.22–5.81)
RDW: 15.8 % — ABNORMAL HIGH (ref 11.5–15.5)
WBC: 8.5 10*3/uL (ref 4.0–10.5)
nRBC: 0 % (ref 0.0–0.2)

## 2018-12-22 LAB — SARS CORONAVIRUS 2 BY RT PCR (HOSPITAL ORDER, PERFORMED IN ~~LOC~~ HOSPITAL LAB): SARS Coronavirus 2: NEGATIVE

## 2018-12-22 LAB — COMPREHENSIVE METABOLIC PANEL
ALT: 9 U/L (ref 0–44)
AST: 10 U/L — ABNORMAL LOW (ref 15–41)
Albumin: 2.8 g/dL — ABNORMAL LOW (ref 3.5–5.0)
Alkaline Phosphatase: 75 U/L (ref 38–126)
Anion gap: 10 (ref 5–15)
BUN: 63 mg/dL — ABNORMAL HIGH (ref 8–23)
CO2: 25 mmol/L (ref 22–32)
Calcium: 8 mg/dL — ABNORMAL LOW (ref 8.9–10.3)
Chloride: 102 mmol/L (ref 98–111)
Creatinine, Ser: 9.65 mg/dL — ABNORMAL HIGH (ref 0.61–1.24)
GFR calc Af Amer: 6 mL/min — ABNORMAL LOW (ref >60)
GFR calc non Af Amer: 5 mL/min — ABNORMAL LOW (ref >60)
Glucose, Bld: 116 mg/dL — ABNORMAL HIGH (ref 70–99)
Potassium: 5 mmol/L (ref 3.5–5.1)
Sodium: 137 mmol/L (ref 135–145)
Total Bilirubin: 0.7 mg/dL (ref 0.3–1.2)
Total Protein: 6.1 g/dL — ABNORMAL LOW (ref 6.5–8.1)

## 2018-12-22 LAB — BRAIN NATRIURETIC PEPTIDE: B Natriuretic Peptide: 625 pg/mL — ABNORMAL HIGH (ref 0.0–100.0)

## 2018-12-22 LAB — TROPONIN I (HIGH SENSITIVITY)
Troponin I (High Sensitivity): 8 ng/L (ref <18)
Troponin I (High Sensitivity): 9 ng/L (ref <18)

## 2018-12-22 LAB — MAGNESIUM: Magnesium: 1.9 mg/dL (ref 1.7–2.4)

## 2018-12-22 LAB — PHOSPHORUS: Phosphorus: 6.9 mg/dL — ABNORMAL HIGH (ref 2.5–4.6)

## 2018-12-22 MED ORDER — ALBUTEROL SULFATE HFA 108 (90 BASE) MCG/ACT IN AERS
4.0000 | INHALATION_SPRAY | Freq: Once | RESPIRATORY_TRACT | Status: AC
  Administered 2018-12-22: 14:00:00 4 via RESPIRATORY_TRACT
  Filled 2018-12-22: qty 6.7

## 2018-12-22 NOTE — ED Provider Notes (Signed)
Emergency Department Provider Note   I have reviewed the triage vital signs and the nursing notes.   HISTORY  Chief Complaint Shortness of Breath   HPI Aaron [name] is a 63 y.o. male with PMH of ESRD (h/o non-compliance), CAD, Discitis/Osteomyelitis, Chronic A fib, and HTN presents to the emergency department with shortness of breath in the setting of missing hemodialysis over the past 4 sessions.  Patient states that he was previously doing peritoneal dialysis at home which he much prefers.  He states he had a complication recently which led to injury of his intestine and required surgery at [facility].  He has since had a right hemodialysis catheter placed and has been scheduled to have dialysis locally.  He tells me that he is "depressed" about having to wake up early and go in for HD.  Patient adamantly denies any suicidal thinking or that skipping dialysis is a form of self-harm.  He denies any fever, chills.  He does have some mild cough and has been using his albuterol at home.  He does continue to smoke cigarettes. Denies CP or back pain. He does note some LE swelling, face swelling, and feeling of abdominal fullness.    Past Medical History:  Diagnosis Date   Anxiety    Arthritis    knees , back , shoulders (10/12/2017)   Atrial fibrillation (HCC)    CAD (coronary artery disease)    Mild nonobstructive disease at cardiac catheterization 2002   Chronic back pain    "back of my neck; down thru my legs" (10/12/2017)   COPD (chronic obstructive pulmonary disease) (HCC)    Diverticulitis    Esophageal reflux    ESRD (end stage renal disease) on dialysis ([place])    "TTS; [place]" (11/23/2017)   Essential hypertension    Hepatitis C    states he no longer has this   History of kidney stones    History of syncope    Hyperlipidemia    Jerking 09/23/2014   Myocardial infarction ([place]) 02/2017   "light one" (10/12/2017)   Non-compliant behavior    non compliant with diaylsis  per daughter   PAT (paroxysmal atrial tachycardia) ([place])    Peripheral arterial disease ([place])    Occluded left superficial femoral artery status post stent June 2016 - Dr. Trula Slade   Pneumonia 1961   Polycystic kidney, unspecified type    Syncope 09/2014   SYNCOPE 05/07/2010   Qualifier: Diagnosis of  By: Laurance Flatten RN, BSN, Jennifer      Patient Active Problem List   Diagnosis Date Noted   Vertebral osteomyelitis ([place]) 04/02/2018   Back pain 03/31/2018   Noncompliance with medication regimen 03/31/2018   CKD (chronic kidney disease) stage 5, GFR less than 15 ml/min ([place]) 02/03/2018   COPD with acute exacerbation ([place]) 02/03/2018   Atrial fibrillation, chronic 02/03/2018   Discitis 01/27/2018   Discitis of lumbar region 01/09/2018   Paraspinal abscess ([place]) 01/08/2018   ESRD on dialysis ([place]) 12/30/2017   Hematoma of arm, right, initial encounter 11/23/2017   Protein-calorie malnutrition, severe 10/15/2017   Hyperlipidemia 08/24/2017   Schatzki's ring of distal esophagus 04/27/2017   Encounter for therapeutic drug monitoring 03/03/2017   History of hepatitis C, naturally cleared  02/20/2017   Ischemic cardiomyopathy 01/27/2017   Dyslipidemia 01/27/2017   AF (paroxysmal atrial fibrillation) ([place]) 01/27/2017   CAD (coronary artery disease) 01/21/2017   Polycystic kidney disease 12/14/2016   COPD (chronic obstructive pulmonary disease) ([place])    COLD (  chronic obstructive lung disease) (HCC)    Depression    PAD (peripheral artery disease) ([place]) 09/19/2014   Preop cardiovascular exam 10/28/2010   Tobacco abuse 10/28/2010   UNSPECIFIED IRON DEFICIENCY ANEMIA 10/04/2009   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   Essential hypertension 10/04/2009   Esophageal reflux 10/04/2009   PRECORDIAL PAIN 10/04/2009    Past Surgical History:  Procedure Laterality Date   ABDOMINAL AORTOGRAM N/A 08/11/2017   Procedure: ABDOMINAL AORTOGRAM;  Surgeon: Serafina Mitchell, MD;  Location: [facility] [facility];  Service: Cardiovascular;  Laterality: N/A;   ANTERIOR CERVICAL DECOMP/DISCECTOMY FUSION     APPLICATION OF WOUND VAC Right 08/14/2017   Procedure: APPLICATION OF PREVENA INCISIONAL WOUND VAC RIGHT GROIN;  Surgeon: Serafina Mitchell, MD;  [facility];  Service: Vascular;  Laterality: Right;   AV FISTULA PLACEMENT Left 09/04/2017   Procedure: ARTERIOVENOUS (AV) FISTULA CREATION LEFT UPPER ARM;  Surgeon: Serafina Mitchell, MD;  Location: [place];  Service: Vascular;  Laterality: Left;   BACK SURGERY     BASCILIC VEIN TRANSPOSITION Left 11/20/2017   Procedure: SECOND STAGE BASILIC VEIN TRANSPOSITION LEFT ARM;  Surgeon: Serafina Mitchell, MD;  Location: [place];  Service: Vascular;  Laterality: Left;   BIOPSY  12/17/2016   Procedure: BIOPSY;  Surgeon: Daneil Dolin, MD;  [facility];  Service: Gastroenterology;;  gastric colon   BIOPSY  04/29/2017   Procedure: BIOPSY;  Surgeon: Daneil Dolin, MD;  [facility];  Service: Endoscopy;;  duodenal biopsies   BUNIONECTOMY Bilateral    COLONOSCOPY  2008   Dr. Oneida Alar: rare sigmoid colon diverticulosis, internal hemorrhoids.    COLONOSCOPY WITH PROPOFOL N/A 12/17/2016   dense left-sided diverticulosis, right colon ulcers s/p biopsy query occult NSAID use vs transient ischemia, not consistent with IBD. CMV stains negative.    ENDARTERECTOMY FEMORAL Right 08/14/2017   Procedure: RIGHT ILLIO-FEMORAL ENDARTERECTOMY;  Surgeon: Serafina Mitchell, MD;  Location[facility];  Service: Vascular;  Laterality: Right;   ESOPHAGEAL DILATION  12/17/2016   EGD with mild Schatzki's ring s/p dilatation, small hiatal hernia, erosive gastropathy (negative H.pylori gastritis)   ESOPHAGOGASTRODUODENOSCOPY  2008   Dr. Oneida Alar: normal esophagus without Barrett's, antritis and duodenitis, path with H.pylori gastritis   ESOPHAGOGASTRODUODENOSCOPY (EGD) WITH PROPOFOL N/A 12/17/2016   Procedure: ESOPHAGOGASTRODUODENOSCOPY (EGD)  WITH PROPOFOL;  Surgeon: Daneil Dolin, MD;  [facility];  Service: Gastroenterology;  Laterality: N/A;   ESOPHAGOGASTRODUODENOSCOPY (EGD) WITH PROPOFOL N/A 04/29/2017   Patchy erythema of gastric mucosa diffusely, extensive inflammatory changes in duodenum, geographic ulceration and mucosal edema present, encroaching somewhat on the lumen yet still widely patent, distal second portion of duodenum appeared abnormal, path with peptic duodenitis with ulceration   GROIN DEBRIDEMENT Right 10/14/2017   Procedure: RIGHT GROIN AND RIGHT LOWER QUADRANT ABDOMEN DEBRIDEMENT WITH PLACEMENT OF ANTIBIOTIC BEADS;  Surgeon: Serafina Mitchell, MD;  Location: [place];  Service: Vascular;  Laterality: Right;   HEMATOMA EVACUATION Left 12/06/2017   Procedure: EVACUATION HEMATOMA;  Surgeon: Waynetta Sandy, MD;  Location: [place];  Service: Vascular;  Laterality: Left;   HERNIA REPAIR  [id number]   umbilical   I&D EXTREMITY Right 09/21/2017   Procedure: IRRIGATION AND DEBRIDEMENT GROIN;  Surgeon: Elam Dutch, MD;  Location: [facility] OR;  Service: Vascular;  Laterality: Right;   I&D EXTREMITY Left 11/23/2017   Procedure: Evacuation Hematoma LEFT UPPER ARM GRAFT;  Surgeon: Elam Dutch, MD;  Location: [place];  Service: Vascular;  Laterality: Left;   INGUINAL HERNIA REPAIR Right 02/2017   [place]   INSERTION OF DIALYSIS CATHETER Right 09/04/2017   Procedure: INSERTION OF TUNNELED  DIALYSIS CATHETER - RIGHT INTERNAL JUGULAR PLACEMENT;  Surgeon: Serafina Mitchell, MD;  [facility];  Service: Vascular;  Laterality: Right;   INSERTION OF DIALYSIS CATHETER Right 07/26/2018   Procedure: INSERTION OF DIALYSIS CATHETER RIGHT INTERNAL JUGULAR;  Surgeon: Marty Heck, MD;  Location: [place];  Service: Vascular;  Laterality: Right;   INSERTION OF ILIAC STENT Right 08/14/2017   Procedure: INSERTION OF RIGHT COMMON ILIAC STENT 52mm x 45mm x 130cm INSERTION OF RIGHT EXTERNAL ILIAC STENT 48mm x 26mm x 130cm  INSERTION OF SUPERFICIAL FERMORAL ARTERY STENT 16mm x 76mm x 130cm;  Surgeon: Serafina Mitchell, MD;  Location: [facility] OR;  Service: Vascular;  Laterality: Right;   IR FLUORO GUIDE CV LINE RIGHT  08/31/2017   IR REMOVAL TUN CV CATH W/O FL  05/28/2018   IR US GUIDE VASC ACCESS RIGHT  08/31/2017   LOWER EXTREMITY ANGIOGRAPHY Right 08/11/2017   Procedure: LOWER EXTREMITY ANGIOGRAPHY;  Surgeon: Serafina Mitchell, MD;  Location: [place] [facility];  Service: Cardiovascular;  Laterality: Right;   PATCH ANGIOPLASTY Right 08/14/2017   Procedure: PATCH ANGIOPLASTY USING HEMASHIELD PATCH 0.3IN Lillie [place];  Surgeon: Serafina Mitchell, MD;  [facility];  Service: Vascular;  Laterality: Right;   PERIPHERAL VASCULAR CATHETERIZATION N/A 09/20/2014   Procedure: Abdominal Aortogram;  Surgeon: Serafina Mitchell, MD;  Location: [place] [facility];  Service: Cardiovascular;  Laterality: N/A;   POSTERIOR FUSION LUMBAR SPINE     TEE WITHOUT CARDIOVERSION N/A 01/12/2018   Procedure: TRANSESOPHAGEAL ECHOCARDIOGRAM (TEE);  Surgeon: Lelon Perla, MD;  Location: [facility];  Service: Cardiovascular;  Laterality: N/A;    Allergies Patient has no known allergies.  Family History  Problem Relation Age of Onset   Alcoholism Mother    Heart disease Father        Massive heart attack   Heart attack Father    Atrial fibrillation Father    Colon cancer Father    Colon cancer Maternal Grandfather 73   Alcoholism Maternal Grandfather    Renal cancer Cousin    Ovarian cancer Sister     Social History Social History   Tobacco Use   Smoking status: Current Every Day Smoker    Packs/day: 0.25    Years: 47.00    Pack years: 11.75    Types: Cigarettes    Last attempt to quit: 08/03/2017    Years since quitting: 1.3   Smokeless tobacco: Never Used  Substance Use Topics   Alcohol use: Not Currently    Comment: 10/12/2017 alcohol free since 2017,  heavy drinker in the past   Drug use: Not Currently     Comment: "<2003 whatever was around; nothing since"    Review of Systems  Constitutional: No fever/chills Eyes: No visual changes. ENT: No sore throat. Cardiovascular: Denies chest pain. Positive LE swelling.  Respiratory: Positive shortness of breath. Gastrointestinal: No abdominal pain. Positive abdominal fullness. No nausea, no vomiting.  No diarrhea.  No constipation. Genitourinary: Negative for dysuria. Musculoskeletal: Negative for back pain. Skin: Negative for rash. Neurological: Negative for headaches, focal weakness or numbness.  10-point ROS otherwise negative.  ____________________________________________   PHYSICAL EXAM:  VITAL SIGNS: ED Triage Vitals  Enc Vitals Group     BP 12/22/18 1335 140/86     Pulse Rate 12/22/18 1335 62  Resp 12/22/18 1335 20     Temp 12/22/18 1335 98.1 F (36.7 C)     Temp Source 12/22/18 1335 Oral     SpO2 12/22/18 1335 97 %     Weight 12/22/18 1335 181 lb (82.1 kg)     Height 12/22/18 1335 6\' 3"  (1.905 m)   Constitutional: Alert and oriented. Able to provide a full history but some SOB noted.  Eyes: Conjunctivae are normal.  Head: Atraumatic. Nose: No congestion/rhinnorhea. Mouth/Throat: Mucous membranes are moist.  Neck: No stridor.  Cardiovascular: Normal rate, regular rhythm. Good peripheral circulation. Grossly normal heart sounds.   Respiratory: Increased respiratory effort.  No retractions. Lungs with end-expiratory wheezing and crackles at the bases.  Gastrointestinal: Soft and nontender. Mild distension. Dressings to the lower abdomen are clean and dry.  Musculoskeletal: No lower extremity tenderness.  Neurologic:  Normal speech and language. Skin:  Skin is warm, dry and intact. No rash noted.  ____________________________________________   LABS (all labs ordered are listed, but only abnormal results are displayed)  Labs Reviewed  COMPREHENSIVE METABOLIC PANEL - Abnormal; Notable for the following components:       Result Value   Glucose, Bld 116 (*)    BUN 63 (*)    Creatinine, Ser 9.65 (*)    Calcium 8.0 (*)    Total Protein 6.1 (*)    Albumin 2.8 (*)    AST 10 (*)    GFR calc non Af Amer 5 (*)    GFR calc Af Amer 6 (*)    All other components within normal limits  CBC WITH DIFFERENTIAL/PLATELET - Abnormal; Notable for the following components:   RBC 2.82 (*)    Hemoglobin 9.2 (*)    HCT 28.1 (*)    RDW 15.8 (*)    All other components within normal limits  BRAIN NATRIURETIC PEPTIDE - Abnormal; Notable for the following components:   B Natriuretic Peptide 625.0 (*)    All other components within normal limits  PHOSPHORUS - Abnormal; Notable for the following components:   Phosphorus 6.9 (*)    All other components within normal limits  SARS CORONAVIRUS 2 (HOSPITAL ORDER, [place])  MAGNESIUM  TROPONIN I (HIGH SENSITIVITY)  TROPONIN I (HIGH SENSITIVITY)   ____________________________________________  EKG   EKG Interpretation  Date/Time:  Wednesday December 22 2018 14:19:36 EDT Ventricular Rate:  56 PR Interval:    QRS Duration: 89 QT Interval:  452 QTC Calculation: 437 R Axis:   -16 Text Interpretation:  Sinus rhythm Prolonged PR interval Borderline left axis deviation Anterior infarct, old No STEMI  Confirmed by Nanda Quinton [phone]) on 12/22/2018 2:22:38 PM       ____________________________________________  RADIOLOGY  Dg Chest Portable 1 View  Result Date: 12/22/2018 CLINICAL DATA:  Shortness of breath. EXAM: PORTABLE CHEST 1 VIEW COMPARISON:  Radiographs of November 26, 2018. FINDINGS: Stable cardiomegaly. Atherosclerosis of thoracic aorta is noted. No pneumothorax is noted. Right internal jugular catheter is noted with distal tip in expected position of the SVC. Mild bibasilar subsegmental atelectasis is noted with small pleural effusions. Bony thorax is unremarkable. IMPRESSION: Interval placement of right internal jugular dialysis catheter  with distal tip in expected position of the SVC. Mild bibasilar subsegmental atelectasis is noted with small pleural effusions. Aortic Atherosclerosis (ICD10-I70.0). Electronically Signed   By: Marijo Conception M.D.   On: 12/22/2018 14:22    ____________________________________________   PROCEDURES  Procedure(s) performed:   Procedures  None ____________________________________________  INITIAL IMPRESSION / ASSESSMENT AND PLAN / ED COURSE  Pertinent labs & imaging results that were available during my care of the patient were reviewed by me and considered in my medical decision making (see chart for details).   Patient presents to the emergency department with shortness of breath in the setting of dialysis noncompliance.  HD cath is well-appearing.  Abdomen is mildly distended with no focal tenderness.  Dressings are clean and dry in the lower abdomen.  Patient's shortness of breath is likely multifactorial with some wheezing noted on exam as well as mild rales at the bases.  No hypoxemia but becomes dyspneic with movement or short distance ambulation.   Labs and CXR reviewed. COVID pending. No indication for emergent HD at this time. No hypoxemia. WOB improved. Care transferred to Dr. Roderic Palau pending COVID testing. If negative, plan for discharge with outpatient HD follow up.  ____________________________________________  FINAL CLINICAL IMPRESSION(S) / ED DIAGNOSES  Final diagnoses:  SOB (shortness of breath)     MEDICATIONS GIVEN DURING THIS VISIT:  Medications  albuterol (VENTOLIN HFA) 108 (90 Base) MCG/ACT inhaler 4 puff (4 puffs Inhalation Given 12/22/18 1415)    Note:  This document was prepared using Dragon voice recognition software and may include unintentional dictation errors.  Nanda Quinton, MD Emergency Medicine   Shakesha Soltau, [facility], MD 12/22/18 [phone]

## 2018-12-22 NOTE — Discharge Instructions (Addendum)
Go to dialysis tomorrow

## 2018-12-22 NOTE — ED Triage Notes (Signed)
Pt with sob and swelling to bilateral feet and legs. Reported that pt has not had dialysis in 1 and 1/2 weeks

## 2018-12-27 ENCOUNTER — Other Ambulatory Visit: Payer: Self-pay

## 2018-12-27 ENCOUNTER — Encounter: Payer: Self-pay | Admitting: Family

## 2018-12-27 ENCOUNTER — Ambulatory Visit (INDEPENDENT_AMBULATORY_CARE_PROVIDER_SITE_OTHER): Payer: 59 | Admitting: Family

## 2018-12-27 DIAGNOSIS — Z992 Dependence on renal dialysis: Secondary | ICD-10-CM

## 2018-12-27 DIAGNOSIS — N186 End stage renal disease: Secondary | ICD-10-CM

## 2018-12-27 DIAGNOSIS — I739 Peripheral vascular disease, unspecified: Secondary | ICD-10-CM | POA: Diagnosis not present

## 2018-12-27 NOTE — Progress Notes (Signed)
Virtual Visit via Telephone Note   I was unable to connect with Aaron [name] on 12/27/2018 using the [employer] by telephone. I left a message on his voicemail to call [phone]. I called him at phone number [phone].   PCP: [facility]  Chief Complaint: follow up peripheral artery occlusive disease  History of Present Illness: Aaron [name] is a 63 y.o. male who is s/p right external iliac common femoral, profundofemoral, and superficial femoral artery endarterectomy with bovine pericardial patch angioplasty, stent placement right common iliac artery, stent placement right external iliac artery, drug-coated stent placement right superficial femoral artery, abdominal aortogram with CO2, and placement of a Praveena incisional wound VAC on  08-14-17  By Dr. Trula Slade for severe right leg claudication bordering on rest pain.  He has not had ABI's performed that were ordered on 12-13-18.   ED visit on 12-22-18: "Patient presents to the emergency department with shortness of breath in the setting of dialysis noncompliance.  HD cath is well-appearing.  Abdomen is mildly distended with no focal tenderness.  Dressings are clean and dry in the lower abdomen.  Patient's shortness of breath is likely multifactorial with some wheezing noted on exam as well as mild rales at the bases.  No hypoxemia but becomes dyspneic with movement or short distance ambulation. "  He is also s/p insertion of TDC into right IJ for hemodialysis on 07-26-18 by Dr. Carlis Abbott.   Past Medical History:  Diagnosis Date  . Anxiety   . Arthritis    knees , back , shoulders (10/12/2017)  . Atrial fibrillation ([place])   . CAD (coronary artery disease)    Mild nonobstructive disease at cardiac catheterization 2002  . Chronic back pain    "back of my neck; down thru my legs" (10/12/2017)  . COPD (chronic obstructive pulmonary disease) ([place])   . Diverticulitis   . Esophageal reflux   . ESRD (end stage  renal disease) on dialysis [facility])    "TTS; [place]" (11/23/2017)  . Essential hypertension   . Hepatitis C    states he no longer has this  . History of kidney stones   . History of syncope   . Hyperlipidemia   . Jerking 09/23/2014  . Myocardial infarction ([place]) 02/2017   "light one" (10/12/2017)  . Non-compliant behavior    non compliant with diaylsis per daughter  . PAT (paroxysmal atrial tachycardia) ([place])   . Peripheral arterial disease ([place])    Occluded left superficial femoral artery status post stent June 2016 - Dr. Trula Slade  . Pneumonia 1961  . Polycystic kidney, unspecified type   . Syncope 09/2014  . SYNCOPE 05/07/2010   Qualifier: Diagnosis of  By: Laurance Flatten RN, BSN, Anderson Malta      Past Surgical History:  Procedure Laterality Date  . ABDOMINAL AORTOGRAM N/A 08/11/2017   Procedure: ABDOMINAL AORTOGRAM;  Surgeon: Serafina Mitchell, MD;  Location: [place] [facility];  Service: Cardiovascular;  Laterality: N/A;  . ANTERIOR CERVICAL DECOMP/DISCECTOMY FUSION    . APPLICATION OF WOUND VAC Right 08/14/2017   Procedure: APPLICATION OF PREVENA INCISIONAL WOUND VAC RIGHT GROIN;  Surgeon: Serafina Mitchell, MD;  Location: [facility];  Service: Vascular;  Laterality: Right;  . AV FISTULA PLACEMENT Left 09/04/2017   Procedure: ARTERIOVENOUS (AV) FISTULA CREATION LEFT UPPER ARM;  Surgeon: Serafina Mitchell, MD;  Location: [place];  Service: Vascular;  Laterality: Left;  . BACK SURGERY    . BASCILIC VEIN TRANSPOSITION Left 11/20/2017  Procedure: SECOND STAGE BASILIC VEIN TRANSPOSITION LEFT ARM;  Surgeon: Serafina Mitchell, MD;  Location: [place];  Service: Vascular;  Laterality: Left;  . BIOPSY  12/17/2016   Procedure: BIOPSY;  Surgeon: Daneil Dolin, MD;  [facility];  Service: Gastroenterology;;  gastric colon  . BIOPSY  04/29/2017   Procedure: BIOPSY;  Surgeon: Daneil Dolin, MD;  [facility];  Service: Endoscopy;;  duodenal biopsies  . BUNIONECTOMY Bilateral   . COLONOSCOPY  2008    Dr. Oneida Alar: rare sigmoid colon diverticulosis, internal hemorrhoids.   . COLONOSCOPY WITH PROPOFOL N/A 12/17/2016   dense left-sided diverticulosis, right colon ulcers s/p biopsy query occult NSAID use vs transient ischemia, not consistent with IBD. CMV stains negative.   Marland Kitchen ENDARTERECTOMY FEMORAL Right 08/14/2017   Procedure: RIGHT ILLIO-FEMORAL ENDARTERECTOMY;  Surgeon: Serafina Mitchell, MD;  Location: [facility] OR;  Service: Vascular;  Laterality: Right;  . ESOPHAGEAL DILATION  12/17/2016   EGD with mild Schatzki's ring s/p dilatation, small hiatal hernia, erosive gastropathy (negative H.pylori gastritis)  . ESOPHAGOGASTRODUODENOSCOPY  2008   Dr. Oneida Alar: normal esophagus without Barrett's, antritis and duodenitis, path with H.pylori gastritis  . ESOPHAGOGASTRODUODENOSCOPY (EGD) WITH PROPOFOL N/A 12/17/2016   Procedure: ESOPHAGOGASTRODUODENOSCOPY (EGD) WITH PROPOFOL;  Surgeon: Daneil Dolin, MD;  [facility];  Service: Gastroenterology;  Laterality: N/A;  . ESOPHAGOGASTRODUODENOSCOPY (EGD) WITH PROPOFOL N/A 04/29/2017   Patchy erythema of gastric mucosa diffusely, extensive inflammatory changes in duodenum, geographic ulceration and mucosal edema present, encroaching somewhat on the lumen yet still widely patent, distal second portion of duodenum appeared abnormal, path with peptic duodenitis with ulceration  . GROIN DEBRIDEMENT Right 10/14/2017   Procedure: RIGHT GROIN AND RIGHT LOWER QUADRANT ABDOMEN DEBRIDEMENT WITH PLACEMENT OF ANTIBIOTIC BEADS;  Surgeon: Serafina Mitchell, MD;  Location: [place];  Service: Vascular;  Laterality: Right;  . HEMATOMA EVACUATION Left 12/06/2017   Procedure: EVACUATION HEMATOMA;  Surgeon: Waynetta Sandy, MD;  Location: [place];  Service: Vascular;  Laterality: Left;  . HERNIA REPAIR  [id number]   umbilical  . I&D EXTREMITY Right 09/21/2017   Procedure: IRRIGATION AND DEBRIDEMENT GROIN;  Surgeon: Elam Dutch, MD;  Location: [facility];  Service: Vascular;  Laterality:  Right;  . I&D EXTREMITY Left 11/23/2017   Procedure: Evacuation Hematoma LEFT UPPER ARM GRAFT;  Surgeon: Elam Dutch, MD;  Location: [place];  Service: Vascular;  Laterality: Left;  . INGUINAL HERNIA REPAIR Right 02/2017   [place]  . INSERTION OF DIALYSIS CATHETER Right 09/04/2017   Procedure: INSERTION OF TUNNELED  DIALYSIS CATHETER - RIGHT INTERNAL JUGULAR PLACEMENT;  Surgeon: Serafina Mitchell, MD;  Location: [place];  Service: Vascular;  Laterality: Right;  . INSERTION OF DIALYSIS CATHETER Right 07/26/2018   Procedure: INSERTION OF DIALYSIS CATHETER RIGHT INTERNAL JUGULAR;  Surgeon: Marty Heck, MD;  Location: [place];  Service: Vascular;  Laterality: Right;  . INSERTION OF ILIAC STENT Right 08/14/2017   Procedure: INSERTION OF RIGHT COMMON ILIAC STENT 27mm x 60mm x 130cm INSERTION OF RIGHT EXTERNAL ILIAC STENT 27mm x 45mm x 130cm INSERTION OF SUPERFICIAL FERMORAL ARTERY STENT 51mm x 58mm x 130cm;  Surgeon: Serafina Mitchell, MD;  Location: [facility] OR;  Service: Vascular;  Laterality: Right;  . IR FLUORO GUIDE CV LINE RIGHT  08/31/2017  . IR REMOVAL TUN CV CATH W/O FL  05/28/2018  . IR US GUIDE VASC ACCESS RIGHT  08/31/2017  . LOWER EXTREMITY ANGIOGRAPHY Right 08/11/2017   Procedure:  LOWER EXTREMITY ANGIOGRAPHY;  Surgeon: Serafina Mitchell, MD;  Location: [place] [facility];  Service: Cardiovascular;  Laterality: Right;  . PATCH ANGIOPLASTY Right 08/14/2017   Procedure: PATCH ANGIOPLASTY USING HEMASHIELD PATCH 0.3IN Lillie Columbia;  Surgeon: Serafina Mitchell, MD;  [facility];  Service: Vascular;  Laterality: Right;  . PERIPHERAL VASCULAR CATHETERIZATION N/A 09/20/2014   Procedure: Abdominal Aortogram;  Surgeon: Serafina Mitchell, MD;  Location: [place] [facility];  Service: Cardiovascular;  Laterality: N/A;  . POSTERIOR FUSION LUMBAR SPINE    . TEE WITHOUT CARDIOVERSION N/A 01/12/2018   Procedure: TRANSESOPHAGEAL ECHOCARDIOGRAM (TEE);  Surgeon: Lelon Perla, MD;  Location: [facility] ENDOSCOPY;  Service:  Cardiovascular;  Laterality: N/A;    Current Outpatient Medications on File Prior to Visit  Medication Sig Dispense Refill  . albuterol (PROVENTIL HFA;VENTOLIN HFA) 108 (90 Base) MCG/ACT inhaler Inhale 2 puffs into the lungs every 6 (six) hours as needed for wheezing or shortness of breath.    Marland Kitchen albuterol (PROVENTIL) (2.5 MG/3ML) 0.083% nebulizer solution Inhale 3 mLs into the lungs 3 (three) times daily as needed for shortness of breath or wheezing.    Marland Kitchen BREO ELLIPTA 200-25 MCG/INH AEPB Inhale 1 puff into the lungs daily as needed (for respiratory issues.).     Marland Kitchen calcium acetate (PHOSLO) 667 MG capsule Take 667 mg by mouth 3 (three) times daily with meals.    . cefTRIAXone 2 g in sodium chloride 0.9 % 100 mL Inject 2 g into the vein daily. Last dose 05/26/2018 (Patient not taking: Reported on 07/22/2018) 52 vial 0  . Cholecalciferol (VITAMIN D3) 50 MCG (2000 UT) TABS Take 2,000 Units by mouth daily.    . clopidogrel (PLAVIX) 75 MG tablet Take 75 mg by mouth daily.    Marland Kitchen diltiazem (CARDIZEM CD) 360 MG 24 hr capsule Take 1 capsule (360 mg total) by mouth daily. 30 capsule 1  . ELIQUIS 2.5 MG TABS tablet Take 2.5 mg by mouth 2 (two) times daily.    . fluticasone (FLONASE) 50 MCG/ACT nasal spray Place 1 spray into both nostrils daily as needed for allergies.     . furosemide (LASIX) 40 MG tablet Take 1 tablet (40 mg total) by mouth daily. (Patient not taking: Reported on 07/22/2018) 30 tablet 1  . furosemide (LASIX) 80 MG tablet Take 80 mg by mouth 2 (two) times daily.    Marland Kitchen gabapentin (NEURONTIN) 300 MG capsule Take 300 mg by mouth 2 (two) times daily.    . hydrALAZINE (APRESOLINE) 50 MG tablet Take 50 mg by mouth 3 (three) times daily.    Marland Kitchen HYDROcodone-acetaminophen (NORCO/VICODIN) 5-325 MG tablet Take 1 tablet by mouth every 4 (four) hours as needed for moderate pain. 10 tablet 0  . isosorbide mononitrate (IMDUR) 60 MG 24 hr tablet Take 60 mg by mouth daily.    . metoprolol tartrate (LOPRESSOR) 100 MG  tablet Take 1 tablet (100 mg total) by mouth 2 (two) times daily. 60 tablet 1  . nitroGLYCERIN (NITROSTAT) 0.4 MG SL tablet Place 0.4 mg under the tongue every 5 (five) minutes x 3 doses as needed for chest pain.    Marland Kitchen oxyCODONE-acetaminophen (PERCOCET) 10-325 MG tablet Take 1 tablet by mouth every 4 (four) hours as needed for pain.    . pantoprazole (PROTONIX) 40 MG tablet Take 1 tablet (40 mg total) by mouth 2 (two) times daily. (Patient taking differently: Take 40 mg by mouth daily. ) 60 tablet 1  . sodium bicarbonate 650 MG  tablet Take 1 tablet (650 mg total) by mouth daily. (Patient taking differently: Take 650 mg by mouth 3 (three) times daily. ) 30 tablet 0  . traZODone (DESYREL) 50 MG tablet Take 75 mg by mouth at bedtime.     . vancomycin (VANCOCIN) 1-5 GM/200ML-% SOLN Inject 200 mLs (1,000 mg total) into the vein every other day. Last dose on 05/26/2018 (Patient not taking: Reported on 07/22/2018) 4000 mL 0  . warfarin (COUMADIN) 5 MG tablet Take 1 tablet (5 mg total) by mouth daily at 6 PM. (Patient not taking: Reported on 07/22/2018) 30 tablet 0   No current facility-administered medications on file prior to visit.        Observations/Objective: He has not had ABI's performed that were ordered on 12-13-18, no results on file.  Assessment and Plan: Unable to speak with pt. He needs ABI's as soon as possible, and follow up with NP or PA or Dr. Trula Slade.   Follow Up Instructions:  He needs ABI's as soon as possible, and follow up with NP or PA or Dr. Trula Slade.    Signed, Vinnie Level Chrisann Melaragno [facility] Office: [phone]  12/27/2018, 9:09 AM

## 2019-01-03 ENCOUNTER — Ambulatory Visit (INDEPENDENT_AMBULATORY_CARE_PROVIDER_SITE_OTHER): Payer: 59 | Admitting: Family

## 2019-01-03 ENCOUNTER — Encounter: Payer: Self-pay | Admitting: Family

## 2019-01-03 ENCOUNTER — Ambulatory Visit (HOSPITAL_COMMUNITY)
Admission: RE | Admit: 2019-01-03 | Discharge: 2019-01-03 | Disposition: A | Discharge status: Home / Self Care | Payer: 59 | Source: Ambulatory Visit | Attending: Family | Admitting: Family

## 2019-01-03 ENCOUNTER — Other Ambulatory Visit: Payer: Self-pay

## 2019-01-03 VITALS — BP 182/93 | HR 78 | Temp 97.9°F | Resp 18 | Ht 75.0 in | Wt 188.0 lb

## 2019-01-03 DIAGNOSIS — I779 Disorder of arteries and arterioles, unspecified: Secondary | ICD-10-CM

## 2019-01-03 DIAGNOSIS — I739 Peripheral vascular disease, unspecified: Secondary | ICD-10-CM | POA: Diagnosis not present

## 2019-01-03 DIAGNOSIS — Z720 Tobacco use: Secondary | ICD-10-CM

## 2019-01-03 DIAGNOSIS — N186 End stage renal disease: Secondary | ICD-10-CM

## 2019-01-03 DIAGNOSIS — Z992 Dependence on renal dialysis: Secondary | ICD-10-CM

## 2019-01-03 NOTE — Progress Notes (Signed)
[facility] [place]   CC: Follow up peripheral artery occlusive disease  History of Present Illness Aaron [name] is a 63 y.o. male who is s/p right external iliac common femoral, profundofemoral, and superficial femoral artery endarterectomy with bovine pericardial patch angioplasty, stent placement right common iliac artery, stent placement right external iliac artery, drug-coated stent placement right superficial femoral artery, abdominal aortogram with CO2, and placement of a Praveena incisional wound VAC on  08-14-17  By Dr. Trula Slade for severe right leg claudication bordering on rest pain.  He returns today for ABI's and evaluation.   ED visit on 12-22-18: "Patient presents to the emergency department with shortness of breath in the setting of dialysis noncompliance. HD cath is well-appearing. Abdomen is mildly distended with no focal tenderness. Dressings are clean and dry in the lower abdomen. Patient's shortness of breath is likely multifactorial with some wheezing noted on exam as well as mild rales at the bases. No hypoxemia but becomes dyspneic with movement or short distance ambulation."  He is also s/p insertion of TDC into right IJ for hemodialysis on 07-26-18 by Dr. Carlis Abbott. He dialyzes TTS via right IJ TDC.  He had a peritoneal dialysis catheter that became infected, had it excised in [place] in early September 2020.   He states that the swelling in his ankles mostly resolves by morning with overnight elevation of his lower extremities.   He states that he had a left upper arm permanent HD access, injured his left upper arm, and AVF had to be ligated or removed.  Diabetic: No Tobacco use: smoker  (x47 yrs)  Pt meds include: Statin :No Betablocker: Yes ASA: No Other anticoagulants/antiplatelets: Plavix, Eliquis  Past Medical History:  Diagnosis Date  . Anxiety   . Arthritis    knees , back , shoulders (10/12/2017)  . Atrial fibrillation  ([place])   . CAD (coronary artery disease)    Mild nonobstructive disease at cardiac catheterization 2002  . Chronic back pain    "back of my neck; down thru my legs" (10/12/2017)  . COPD (chronic obstructive pulmonary disease) ([place])   . Diverticulitis   . Esophageal reflux   . ESRD (end stage renal disease) on dialysis [facility])    "TTS; [place]" (11/23/2017)  . Essential hypertension   . Hepatitis C    states he no longer has this  . History of kidney stones   . History of syncope   . Hyperlipidemia   . Jerking 09/23/2014  . Myocardial infarction ([place]) 02/2017   "light one" (10/12/2017)  . Non-compliant behavior    non compliant with diaylsis per daughter  . PAT (paroxysmal atrial tachycardia) ([place])   . Peripheral arterial disease ([place])    Occluded left superficial femoral artery status post stent June 2016 - Dr. Trula Slade  . Pneumonia 1961  . Polycystic kidney, unspecified type   . Syncope 09/2014  . SYNCOPE 05/07/2010   Qualifier: Diagnosis of  By: Laurance Flatten RN, BSN, [place]      Social History Social History   Tobacco Use  . Smoking status: Current Every Day Smoker    Packs/day: 0.25    Years: 47.00    Pack years: 11.75    Types: Cigarettes    Last attempt to quit: 08/03/2017    Years since quitting: 1.4  . Smokeless tobacco: Never Used  Substance Use Topics  . Alcohol use: Not Currently    Comment: 10/12/2017 alcohol free since 2017,  heavy drinker in the  past  . Drug use: Not Currently    Comment: "<2003 whatever was around; nothing since"    Family History Family History  Problem Relation Age of Onset  . Alcoholism Mother   . Heart disease Father        Massive heart attack  . Heart attack Father   . Atrial fibrillation Father   . Colon cancer Father   . Colon cancer Maternal Grandfather 8  . Alcoholism Maternal Grandfather   . Renal cancer Cousin   . Ovarian cancer Sister     Past Surgical History:  Procedure Laterality Date  . ABDOMINAL AORTOGRAM N/A 08/11/2017    Procedure: ABDOMINAL AORTOGRAM;  Surgeon: Serafina Mitchell, MD;  Location: [place] [facility];  Service: Cardiovascular;  Laterality: N/A;  . ANTERIOR CERVICAL DECOMP/DISCECTOMY FUSION    . APPLICATION OF WOUND VAC Right 08/14/2017   Procedure: APPLICATION OF PREVENA INCISIONAL WOUND VAC RIGHT GROIN;  Surgeon: Serafina Mitchell, MD;  [facility];  Service: Vascular;  Laterality: Right;  . AV FISTULA PLACEMENT Left 09/04/2017   Procedure: ARTERIOVENOUS (AV) FISTULA CREATION LEFT UPPER ARM;  Surgeon: Serafina Mitchell, MD;  Location: [place];  Service: Vascular;  Laterality: Left;  . BACK SURGERY    . BASCILIC VEIN TRANSPOSITION Left 11/20/2017   Procedure: SECOND STAGE BASILIC VEIN TRANSPOSITION LEFT ARM;  Surgeon: Serafina Mitchell, MD;  Location: [place];  Service: Vascular;  Laterality: Left;  . BIOPSY  12/17/2016   Procedure: BIOPSY;  Surgeon: Daneil Dolin, MD;  [facility];  Service: Gastroenterology;;  gastric colon  . BIOPSY  04/29/2017   Procedure: BIOPSY;  Surgeon: Daneil Dolin, MD;  [facility];  Service: Endoscopy;;  duodenal biopsies  . BUNIONECTOMY Bilateral   . COLONOSCOPY  2008   Dr. Oneida Alar: rare sigmoid colon diverticulosis, internal hemorrhoids.   . COLONOSCOPY WITH PROPOFOL N/A 12/17/2016   dense left-sided diverticulosis, right colon ulcers s/p biopsy query occult NSAID use vs transient ischemia, not consistent with IBD. CMV stains negative.   Marland Kitchen ENDARTERECTOMY FEMORAL Right 08/14/2017   Procedure: RIGHT ILLIO-FEMORAL ENDARTERECTOMY;  Surgeon: Serafina Mitchell, MD;  Location: [facility] OR;  Service: Vascular;  Laterality: Right;  . ESOPHAGEAL DILATION  12/17/2016   EGD with mild Schatzki's ring s/p dilatation, small hiatal hernia, erosive gastropathy (negative H.pylori gastritis)  . ESOPHAGOGASTRODUODENOSCOPY  2008   Dr. Oneida Alar: normal esophagus without Barrett's, antritis and duodenitis, path with H.pylori gastritis  . ESOPHAGOGASTRODUODENOSCOPY (EGD) WITH PROPOFOL  N/A 12/17/2016   Procedure: ESOPHAGOGASTRODUODENOSCOPY (EGD) WITH PROPOFOL;  Surgeon: Daneil Dolin, MD;  [facility];  Service: Gastroenterology;  Laterality: N/A;  . ESOPHAGOGASTRODUODENOSCOPY (EGD) WITH PROPOFOL N/A 04/29/2017   Patchy erythema of gastric mucosa diffusely, extensive inflammatory changes in duodenum, geographic ulceration and mucosal edema present, encroaching somewhat on the lumen yet still widely patent, distal second portion of duodenum appeared abnormal, path with peptic duodenitis with ulceration  . GROIN DEBRIDEMENT Right 10/14/2017   Procedure: RIGHT GROIN AND RIGHT LOWER QUADRANT ABDOMEN DEBRIDEMENT WITH PLACEMENT OF ANTIBIOTIC BEADS;  Surgeon: Serafina Mitchell, MD;  Location: [place];  Service: Vascular;  Laterality: Right;  . HEMATOMA EVACUATION Left 12/06/2017   Procedure: EVACUATION HEMATOMA;  Surgeon: Waynetta Sandy, MD;  Location: [place];  Service: Vascular;  Laterality: Left;  . HERNIA REPAIR  [id number]   umbilical  . I&D EXTREMITY Right 09/21/2017   Procedure: IRRIGATION AND DEBRIDEMENT GROIN;  Surgeon: Elam Dutch, MD;  [facility];  Service: Vascular;  Laterality: Right;  . I&D EXTREMITY Left 11/23/2017   Procedure: Evacuation Hematoma LEFT UPPER ARM GRAFT;  Surgeon: Elam Dutch, MD;  Location: [place];  Service: Vascular;  Laterality: Left;  . INGUINAL HERNIA REPAIR Right 02/2017   [place]  . INSERTION OF DIALYSIS CATHETER Right 09/04/2017   Procedure: INSERTION OF TUNNELED  DIALYSIS CATHETER - RIGHT INTERNAL JUGULAR PLACEMENT;  Surgeon: Serafina Mitchell, MD;  Location: [place];  Service: Vascular;  Laterality: Right;  . INSERTION OF DIALYSIS CATHETER Right 07/26/2018   Procedure: INSERTION OF DIALYSIS CATHETER RIGHT INTERNAL JUGULAR;  Surgeon: Marty Heck, MD;  Location: [place];  Service: Vascular;  Laterality: Right;  . INSERTION OF ILIAC STENT Right 08/14/2017   Procedure: INSERTION OF RIGHT COMMON ILIAC STENT 86mm x 50mm x 130cm  INSERTION OF RIGHT EXTERNAL ILIAC STENT 43mm x 24mm x 130cm INSERTION OF SUPERFICIAL FERMORAL ARTERY STENT 17mm x 38mm x 130cm;  Surgeon: Serafina Mitchell, MD;  Location: [facility] OR;  Service: Vascular;  Laterality: Right;  . IR FLUORO GUIDE CV LINE RIGHT  08/31/2017  . IR REMOVAL TUN CV CATH W/O FL  05/28/2018  . IR US GUIDE VASC ACCESS RIGHT  08/31/2017  . LOWER EXTREMITY ANGIOGRAPHY Right 08/11/2017   Procedure: LOWER EXTREMITY ANGIOGRAPHY;  Surgeon: Serafina Mitchell, MD;  Location: [place] CV LAB;  Service: Cardiovascular;  Laterality: Right;  . PATCH ANGIOPLASTY Right 08/14/2017   Procedure: PATCH ANGIOPLASTY USING HEMASHIELD PATCH 0.3IN Lillie [place];  Surgeon: Serafina Mitchell, MD;  [facility];  Service: Vascular;  Laterality: Right;  . PERIPHERAL VASCULAR CATHETERIZATION N/A 09/20/2014   Procedure: Abdominal Aortogram;  Surgeon: Serafina Mitchell, MD;  Location: [place] CV LAB;  Service: Cardiovascular;  Laterality: N/A;  . POSTERIOR FUSION LUMBAR SPINE    . TEE WITHOUT CARDIOVERSION N/A 01/12/2018   Procedure: TRANSESOPHAGEAL ECHOCARDIOGRAM (TEE);  Surgeon: Lelon Perla, MD;  Location: [facility] ENDOSCOPY;  Service: Cardiovascular;  Laterality: N/A;    No Known Allergies  Current Outpatient Medications  Medication Sig Dispense Refill  . albuterol (PROVENTIL HFA;VENTOLIN HFA) 108 (90 Base) MCG/ACT inhaler Inhale 2 puffs into the lungs every 6 (six) hours as needed for wheezing or shortness of breath.    Marland Kitchen albuterol (PROVENTIL) (2.5 MG/3ML) 0.083% nebulizer solution Inhale 3 mLs into the lungs 3 (three) times daily as needed for shortness of breath or wheezing.    Marland Kitchen BREO ELLIPTA 200-25 MCG/INH AEPB Inhale 1 puff into the lungs daily as needed (for respiratory issues.).     Marland Kitchen calcium acetate (PHOSLO) 667 MG capsule Take 667 mg by mouth 3 (three) times daily with meals.    . cefTRIAXone 2 g in sodium chloride 0.9 % 100 mL Inject 2 g into the vein daily. Last dose 05/26/2018 52 vial 0  . Cholecalciferol  (VITAMIN D3) 50 MCG (2000 UT) TABS Take 2,000 Units by mouth daily.    . clopidogrel (PLAVIX) 75 MG tablet Take 75 mg by mouth daily.    Marland Kitchen diltiazem (CARDIZEM CD) 360 MG 24 hr capsule Take 1 capsule (360 mg total) by mouth daily. 30 capsule 1  . ELIQUIS 2.5 MG TABS tablet Take 2.5 mg by mouth 2 (two) times daily.    . fluticasone (FLONASE) 50 MCG/ACT nasal spray Place 1 spray into both nostrils daily as needed for allergies.     . furosemide (LASIX) 40 MG tablet Take 1 tablet (40 mg total) by mouth daily.  30 tablet 1  . furosemide (LASIX) 80 MG tablet Take 80 mg by mouth 2 (two) times daily.    Marland Kitchen gabapentin (NEURONTIN) 300 MG capsule Take 300 mg by mouth 2 (two) times daily.    . hydrALAZINE (APRESOLINE) 50 MG tablet Take 50 mg by mouth 3 (three) times daily.    Marland Kitchen HYDROcodone-acetaminophen (NORCO/VICODIN) 5-325 MG tablet Take 1 tablet by mouth every 4 (four) hours as needed for moderate pain. 10 tablet 0  . isosorbide mononitrate (IMDUR) 60 MG 24 hr tablet Take 60 mg by mouth daily.    . metoprolol tartrate (LOPRESSOR) 100 MG tablet Take 1 tablet (100 mg total) by mouth 2 (two) times daily. 60 tablet 1  . nitroGLYCERIN (NITROSTAT) 0.4 MG SL tablet Place 0.4 mg under the tongue every 5 (five) minutes x 3 doses as needed for chest pain.    . pantoprazole (PROTONIX) 40 MG tablet Take 1 tablet (40 mg total) by mouth 2 (two) times daily. (Patient taking differently: Take 40 mg by mouth daily. ) 60 tablet 1  . sodium bicarbonate 650 MG tablet Take 1 tablet (650 mg total) by mouth daily. (Patient taking differently: Take 650 mg by mouth 3 (three) times daily. ) 30 tablet 0  . traZODone (DESYREL) 50 MG tablet Take 75 mg by mouth at bedtime.     . vancomycin (VANCOCIN) 1-5 GM/200ML-% SOLN Inject 200 mLs (1,000 mg total) into the vein every other day. Last dose on 05/26/2018 4000 mL 0  . warfarin (COUMADIN) 5 MG tablet Take 1 tablet (5 mg total) by mouth daily at 6 PM. 30 tablet 0  . oxyCODONE-acetaminophen  (PERCOCET) 10-325 MG tablet Take 1 tablet by mouth every 4 (four) hours as needed for pain.     No current facility-administered medications for this visit.     ROS: See HPI for pertinent positives and negatives.   Physical Examination  Vitals:   01/03/19 0833  BP: (!) 182/93  Pulse: 78  Resp: 18  Temp: 97.9 F (36.6 C)  TempSrc: Temporal  SpO2: 100%  Weight: 188 lb (85.3 kg)  Height: 6\' 3"  (1.905 m)   Body mass index is 23.5 kg/m.  General: A&O x 3, WDWN, male appears older than his stated age. Gait: limp HEENT: No gross abnormalities.  Pulmonary: Respirations are somewhat labored at rest, limited air movement in all fields, few scattered rales, no rhonchi or wheezes Cardiac: regular rhythm, no detected murmur.     Right IJ TDC noted     Carotid Bruits Right Left   Negative Negative   Radial pulses are palpable bilaterally   Adominal aortic pulse is not palpable                         VASCULAR EXAM: Extremities without ischemic changes, without Gangrene; without open wounds. 2+ pitting edema in both ankles  LE Pulses Right Left       FEMORAL  3+ palpable  1+ palpable        POPLITEAL  not palpable   not palpable       POSTERIOR TIBIAL  not palpable   not palpable        DORSALIS PEDIS      ANTERIOR TIBIAL 2+ palpable  1+ palpable    Abdomen: soft, NT, healing incision (site of peritoneal dialysis catheter removal) Skin: no rashes, no cellulitis, no ulcers noted. Musculoskeletal: no muscle wasting or atrophy. + mild thoracic kyphosis.   Neurologic: A&O X 3; appropriate affect, Sensation is normal; MOTOR FUNCTION:  moving all extremities equally, motor strength 5/5 throughout. Speech is fluent/normal. CN 2-12 intact. Psychiatric: Thought content is normal, mood appropriate for clinical situation.    DATA  ABI (Date: 01/03/2019): ABI  Findings: +---------+------------------+-----+---------+--------+ Right    Rt Pressure (mmHg)IndexWaveform Comment  +---------+------------------+-----+---------+--------+ Brachial 187                                      +---------+------------------+-----+---------+--------+ PTA      255               1.36 triphasic         +---------+------------------+-----+---------+--------+ DP       255               1.36 triphasic         +---------+------------------+-----+---------+--------+ Great Toe150               0.80                   +---------+------------------+-----+---------+--------+  +---------+------------------+-----+---------+-------+ Left     Lt Pressure (mmHg)IndexWaveform Comment +---------+------------------+-----+---------+-------+ Brachial 187                                     +---------+------------------+-----+---------+-------+ PTA      255               1.36 triphasic        +---------+------------------+-----+---------+-------+ DP       255               1.36 triphasic        +---------+------------------+-----+---------+-------+ Great Toe146               0.78                  +---------+------------------+-----+---------+-------+  +-------+-----------+-----------+------------+------------+ ABI/TBIToday's ABIToday's TBIPrevious ABIPrevious TBI +-------+-----------+-----------+------------+------------+ Right  [place]         0.80       0.56        0.38         +-------+-----------+-----------+------------+------------+ Left   [place]         0.78       0.96        0.67         +-------+-----------+-----------+------------+------------+  Previous study performed on 04/08/2017. This is the first post-operative study.   Summary: Right: Resting right ankle-brachial index indicates noncompressible right lower extremity arteries. The right toe-brachial index is normal. RT great toe pressure =  150 mmHg.  TBIs may be falsely elevated. Left: Resting left ankle-brachial index indicates noncompressible left lower extremity arteries. The left toe-brachial index is normal. LT Great toe pressure = 146  mmHg.  TBIs may be falsely elevated.   ASSESSMENT: Aaron [name] is a 63 y.o. male who is s/p right external iliac common femoral, profundofemoral, and superficial femoral artery endarterectomy with bovine pericardial patch angioplasty, stent placement right common iliac artery, stent placement right external iliac artery, drug-coated stent placement right superficial femoral artery, abdominal aortogram with CO2, and placement of a Praveena incisional wound VAC on  08-14-17  By Dr. Trula Slade for severe right leg claudication bordering on rest pain.    Bilateral ABI are non reliable, vessels are non compressible, all triphasic waveforms.  No signs of ischemia in his lower extremities.   Fortunately he does not have DM, unfortunately he continues to smoke. Over 3 minutes was spent counseling patient re smoking cessation, and patient was given several free resources re smoking cessation.  He takes a daily Plavix and also takes Eliquis.  Chronic pain in right leg: referral to pain management    PLAN:  Based on the patient's vascular studies and examination, pt will return to clinic in 6 months with right aortoiliac duplex, right LE arterial duplex, and ABI;s. I encouraged him to walk more in a safe environment.  His walking seems limited by his dyspnea; the tightness he feel in his right leg seems to improve with more walking.   Dependent edema: measured for knee high compression hose, information given re ETI, see Patient Instructions.   Over 3 minutes was spent counseling patient re smoking cessation, and patient was given several free resources re smoking cessation.  I discussed in depth with the patient the nature of atherosclerosis, and emphasized the importance of maximal medical  management including strict control of blood pressure, blood glucose, and lipid levels, obtaining regular exercise, and cessation of smoking.  The patient is aware that without maximal medical management the underlying atherosclerotic disease process will progress, limiting the benefit of any interventions.  The patient was given information about PAD including signs, symptoms, treatment, what symptoms should prompt the patient to seek immediate medical care, and risk reduction measures to take.  Clemon Chambers, RN, MSN, FNP-C [facility] Phone: [phone]  Clinic MD: Trula Slade  01/03/19 8:40 AM

## 2019-01-03 NOTE — Patient Instructions (Addendum)
Steps to Quit Smoking Smoking tobacco is the leading cause of preventable death. It can affect almost every organ in the body. Smoking puts you and people around you at risk for many serious, long-lasting (chronic) diseases. Quitting smoking can be hard, but it is one of the best things that you can do for your health. It is never too late to quit. How do I get ready to quit? When you decide to quit smoking, make a plan to help you succeed. Before you quit:  Pick a date to quit. Set a date within the next 2 weeks to give you time to prepare.  Write down the reasons why you are quitting. Keep this list in places where you will see it often.  Tell your family, friends, and co-workers that you are quitting. Their support is important.  Talk with your doctor about the choices that may help you quit.  Find out if your health insurance will pay for these treatments.  Know the people, places, things, and activities that make you want to smoke (triggers). Avoid them. What first steps can I take to quit smoking?  Throw away all cigarettes at home, at work, and in your car.  Throw away the things that you use when you smoke, such as ashtrays and lighters.  Clean your car. Make sure to empty the ashtray.  Clean your home, including curtains and carpets. What can I do to help me quit smoking? Talk with your doctor about taking medicines and seeing a counselor at the same time. You are more likely to succeed when you do both.  If you are pregnant or breastfeeding, talk with your doctor about counseling or other ways to quit smoking. Do not take medicine to help you quit smoking unless your doctor tells you to do so. To quit smoking: Quit right away  Quit smoking totally, instead of slowly cutting back on how much you smoke over a period of time.  Go to counseling. You are more likely to quit if you go to counseling sessions regularly. Take medicine You may take medicines to help you quit. Some  medicines need a prescription, and some you can buy over-the-counter. Some medicines may contain a drug called nicotine to replace the nicotine in cigarettes. Medicines may:  Help you to stop having the desire to smoke (cravings).  Help to stop the problems that come when you stop smoking (withdrawal symptoms). Your doctor may ask you to use:  Nicotine patches, gum, or lozenges.  Nicotine inhalers or sprays.  Non-nicotine medicine that is taken by mouth. Find resources Find resources and other ways to help you quit smoking and remain smoke-free after you quit. These resources are most helpful when you use them often. They include:  Online chats with a counselor.  Phone quitlines.  Printed self-help materials.  Support groups or group counseling.  Text messaging programs.  Mobile phone apps. Use apps on your mobile phone or tablet that can help you stick to your quit plan. There are many free apps for mobile phones and tablets as well as websites. Examples include Quit Guide from the CDC and smokefree.gov  What things can I do to make it easier to quit?   Talk to your family and friends. Ask them to support and encourage you.  Call a phone quitline (1-800-QUIT-NOW), reach out to support groups, or work with a counselor.  Ask people who smoke to not smoke around you.  Avoid places that make you want to smoke,   such as: ? Bars. ? Parties. ? Smoke-break areas at work.  Spend time with people who do not smoke.  Lower the stress in your life. Stress can make you want to smoke. Try these things to help your stress: ? Getting regular exercise. ? Doing deep-breathing exercises. ? Doing yoga. ? Meditating. ? Doing a body scan. To do this, close your eyes, focus on one area of your body at a time from head to toe. Notice which parts of your body are tense. Try to relax the muscles in those areas. How will I feel when I quit smoking? Day 1 to 3 weeks Within the first 24 hours,  you may start to have some problems that come from quitting tobacco. These problems are very bad 2-3 days after you quit, but they do not often last for more than 2-3 weeks. You may get these symptoms:  Mood swings.  Feeling restless, nervous, angry, or annoyed.  Trouble concentrating.  Dizziness.  Strong desire for high-sugar foods and nicotine.  Weight gain.  Trouble pooping (constipation).  Feeling like you may vomit (nausea).  Coughing or a sore throat.  Changes in how the medicines that you take for other issues work in your body.  Depression.  Trouble sleeping (insomnia). Week 3 and afterward After the first 2-3 weeks of quitting, you may start to notice more positive results, such as:  Better sense of smell and taste.  Less coughing and sore throat.  Slower heart rate.  Lower blood pressure.  Clearer skin.  Better breathing.  Fewer sick days. Quitting smoking can be hard. Do not give up if you fail the first time. Some people need to try a few times before they succeed. Do your best to stick to your quit plan, and talk with your doctor if you have any questions or concerns. Summary  Smoking tobacco is the leading cause of preventable death. Quitting smoking can be hard, but it is one of the best things that you can do for your health.  When you decide to quit smoking, make a plan to help you succeed.  Quit smoking right away, not slowly over a period of time.  When you start quitting, seek help from your doctor, family, or friends. This information is not intended to replace advice given to you by your health care provider. Make sure you discuss any questions you have with your health care provider. Document Released: 01/25/2009 Document Revised: 06/18/2018 Document Reviewed: 06/19/2018 Elsevier Patient Education  [place].     Peripheral Vascular Disease  Peripheral vascular disease (PVD) is a disease of the blood vessels that are not part  of your heart and brain. A simple term for PVD is poor circulation. In most cases, PVD narrows the blood vessels that carry blood from your heart to the rest of your body. This can reduce the supply of blood to your arms, legs, and internal organs, like your stomach or kidneys. However, PVD most often affects a person's lower legs and feet. Without treatment, PVD tends to get worse. PVD can also lead to acute ischemic limb. This is when an arm or leg suddenly cannot get enough blood. This is a medical emergency. Follow these instructions at home: Lifestyle  Do not use any products that contain nicotine or tobacco, such as cigarettes and e-cigarettes. If you need help quitting, ask your doctor.  Lose weight if you are overweight. Or, stay at a healthy weight as told by your doctor.  Eat  a diet that is low in fat and cholesterol. If you need help, ask your doctor.  Exercise regularly. Ask your doctor for activities that are right for you. General instructions  Take over-the-counter and prescription medicines only as told by your doctor.  Take good care of your feet: ? Wear comfortable shoes that fit well. ? Check your feet often for any cuts or sores.  Keep all follow-up visits as told by your doctor This is important. Contact a doctor if:  You have cramps in your legs when you walk.  You have leg pain when you are at rest.  You have coldness in a leg or foot.  Your skin changes.  You are unable to get or have an erection (erectile dysfunction).  You have cuts or sores on your feet that do not heal. Get help right away if:  Your arm or leg turns cold, numb, and blue.  Your arms or legs become red, warm, swollen, painful, or numb.  You have chest pain.  You have trouble breathing.  You suddenly have weakness in your face, arm, or leg.  You become very confused or you cannot speak.  You suddenly have a very bad headache.  You suddenly cannot see. Summary  Peripheral  vascular disease (PVD) is a disease of the blood vessels.  A simple term for PVD is poor circulation. Without treatment, PVD tends to get worse.  Treatment may include exercise, low fat and low cholesterol diet, and quitting smoking. This information is not intended to replace advice given to you by your health care provider. Make sure you discuss any questions you have with your health care provider. Document Released: 06/25/2009 Document Revised: 03/13/2017 Document Reviewed: 05/08/2016 Elsevier Patient Education  2020 [place].     Edema  Edema is when you have too much fluid in your body or under your skin. Edema may make your legs, feet, and ankles swell up. Swelling is also common in looser tissues, like around your eyes. This is a common condition. It gets more common as you get older. There are many possible causes of edema. Eating too much salt (sodium) and being on your feet or sitting for a long time can cause edema in your legs, feet, and ankles. Hot weather may make edema worse. Edema is usually painless. Your skin may look swollen or shiny. Follow these instructions at home:  Keep the swollen body part raised (elevated) above the level of your heart when you are sitting or lying down.  Do not sit still or stand for a long time.  Do not wear tight clothes. Do not wear garters on your upper legs.  Exercise your legs. This can help the swelling go down.  Wear elastic bandages or support stockings as told by your doctor.  Eat a low-salt (low-sodium) diet to reduce fluid as told by your doctor.  Depending on the cause of your swelling, you may need to limit how much fluid you drink (fluid restriction).  Take over-the-counter and prescription medicines only as told by your doctor. Contact a doctor if:  Treatment is not working.  You have heart, liver, or kidney disease and have symptoms of edema.  You have sudden and unexplained weight gain. Get help right away  if:  You have shortness of breath or chest pain.  You cannot breathe when you lie down.  You have pain, redness, or warmth in the swollen areas.  You have heart, liver, or kidney disease and get edema  all of a sudden.  You have a fever and your symptoms get worse all of a sudden. Summary  Edema is when you have too much fluid in your body or under your skin.  Edema may make your legs, feet, and ankles swell up. Swelling is also common in looser tissues, like around your eyes.  Raise (elevate) the swollen body part above the level of your heart when you are sitting or lying down.  Follow your doctor's instructions about diet and how much fluid you can drink (fluid restriction). This information is not intended to replace advice given to you by your health care provider. Make sure you discuss any questions you have with your health care provider. Document Released: 09/17/2007 Document Revised: 04/03/2017 Document Reviewed: 04/18/2016 Elsevier Patient Education  [place].     To decrease swelling in your feet and legs: Elevate feet above slightly bent knees, feet above heart, overnight and 3-4 times per day for 20 minutes.

## 2019-03-03 ENCOUNTER — Other Ambulatory Visit (HOSPITAL_COMMUNITY): Payer: Self-pay | Admitting: Nephrology

## 2019-03-03 DIAGNOSIS — Z992 Dependence on renal dialysis: Secondary | ICD-10-CM

## 2019-03-03 DIAGNOSIS — N186 End stage renal disease: Secondary | ICD-10-CM

## 2019-03-07 ENCOUNTER — Other Ambulatory Visit: Payer: Self-pay

## 2019-03-07 ENCOUNTER — Ambulatory Visit (HOSPITAL_COMMUNITY)
Admission: RE | Admit: 2019-03-07 | Discharge: 2019-03-07 | Disposition: A | Discharge status: Home / Self Care | Payer: 59 | Source: Ambulatory Visit | Attending: Nephrology | Admitting: Nephrology

## 2019-03-07 ENCOUNTER — Encounter (HOSPITAL_COMMUNITY): Payer: Self-pay | Admitting: Physician Assistant

## 2019-03-07 DIAGNOSIS — Z4901 Encounter for fitting and adjustment of extracorporeal dialysis catheter: Secondary | ICD-10-CM | POA: Insufficient documentation

## 2019-03-07 DIAGNOSIS — N186 End stage renal disease: Secondary | ICD-10-CM | POA: Diagnosis not present

## 2019-03-07 HISTORY — PX: IR REMOVAL TUN CV CATH W/O FL: IMG2289

## 2019-03-07 MED ORDER — CHLORHEXIDINE GLUCONATE 4 % EX LIQD
CUTANEOUS | Status: AC
  Filled 2019-03-07: qty 15

## 2019-03-07 MED ORDER — LIDOCAINE HCL 1 % IJ SOLN
INTRAMUSCULAR | Status: AC
  Filled 2019-03-07: qty 20

## 2019-03-07 NOTE — Procedures (Signed)
Pre procedural Dx: ESRD Post procedural Dx: Same  Successful removal of tunneled HD catheter. Catheter removed intact   EBL: None No immediate complications.  Please see imaging section of Epic for full dictation.  Rushie Nyhan NP

## 2019-05-17 IMAGING — US US PELVIS LIMITED
1 series · 12 of 12 positions shown · non-contrast
Comparison: CT abdomen and pelvis 04/27/2017

CLINICAL DATA: Inguinal hernia

EXAM:
LIMITED ULTRASOUND OF PELVIS
TECHNIQUE: Limited transabdominal ultrasound examination of the pelvis was
performed.

[Series 1: us pelvis limited · 0.09mm/px · 12 acquisitions, 12 frames shown]
[im 1/12]
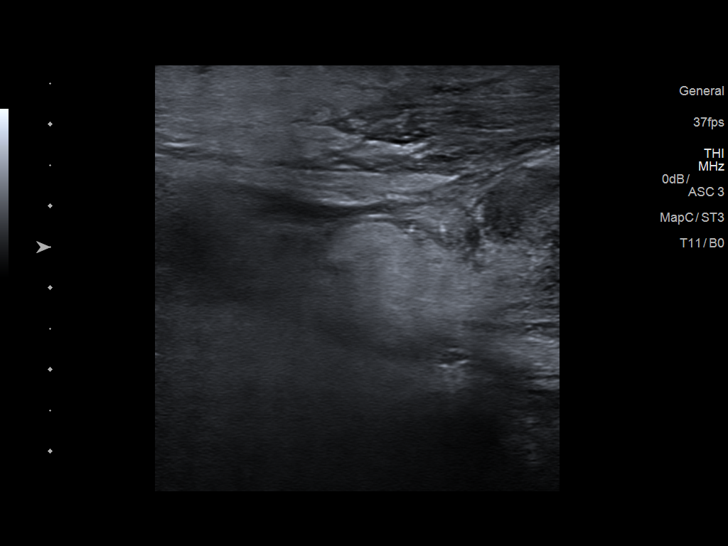
[im 2/12]
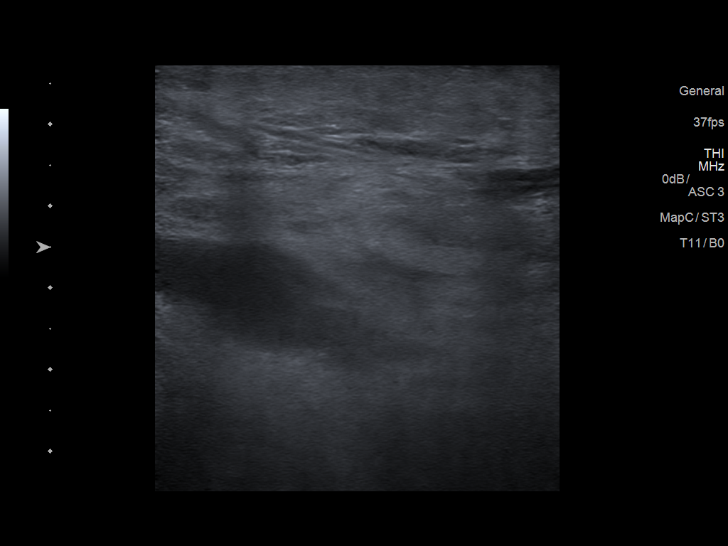
[im 3/12]
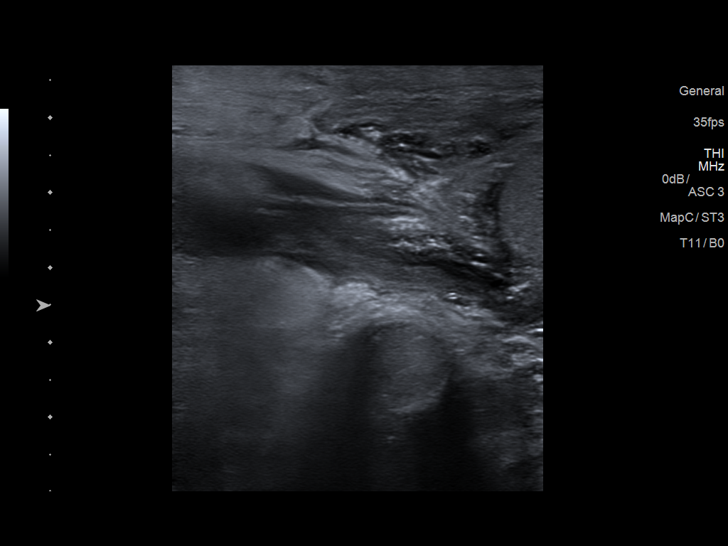
[im 4/12]
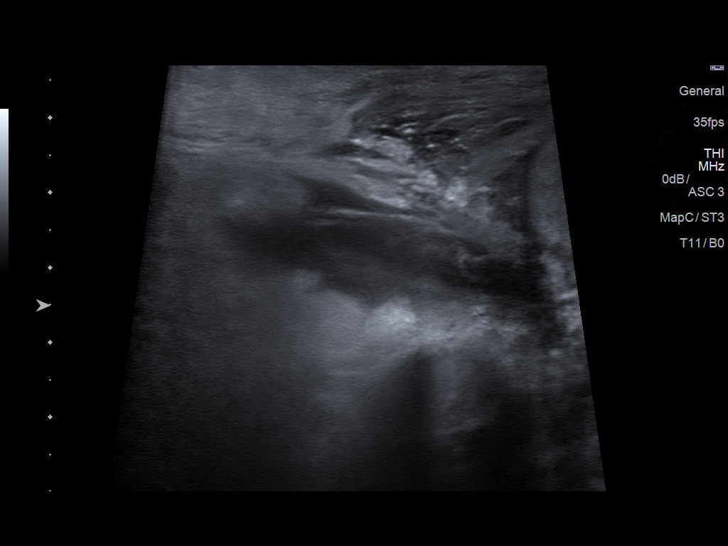
[im 5/12]
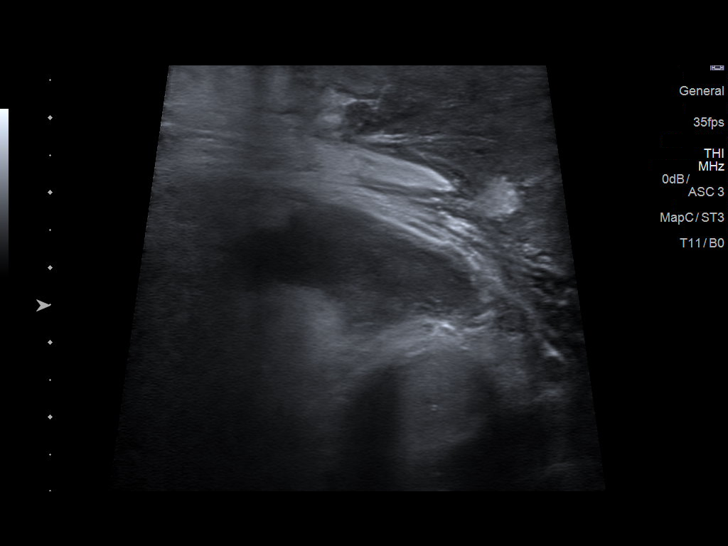
[im 6/12]
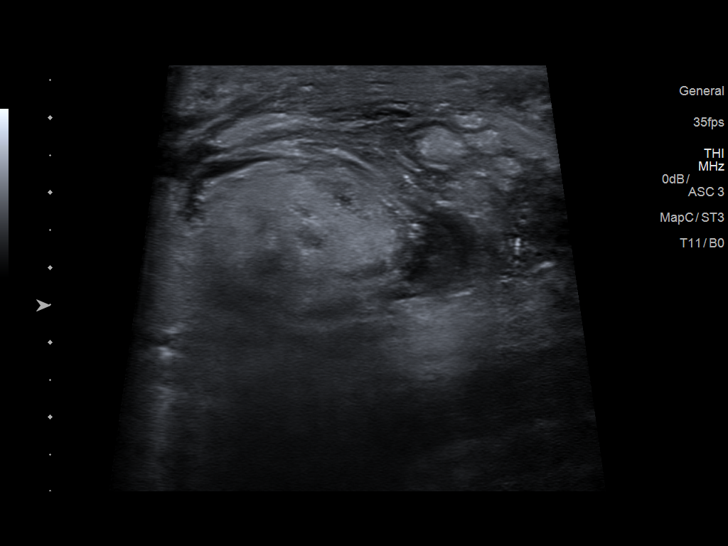
[im 7/12]
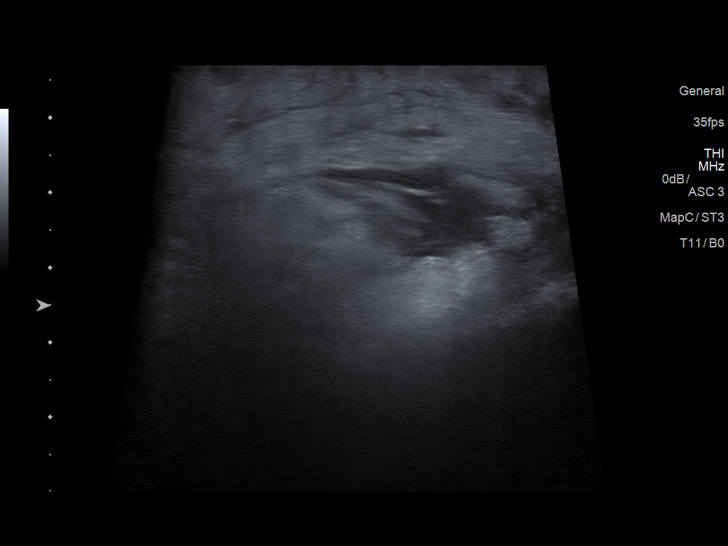
[im 8/12]
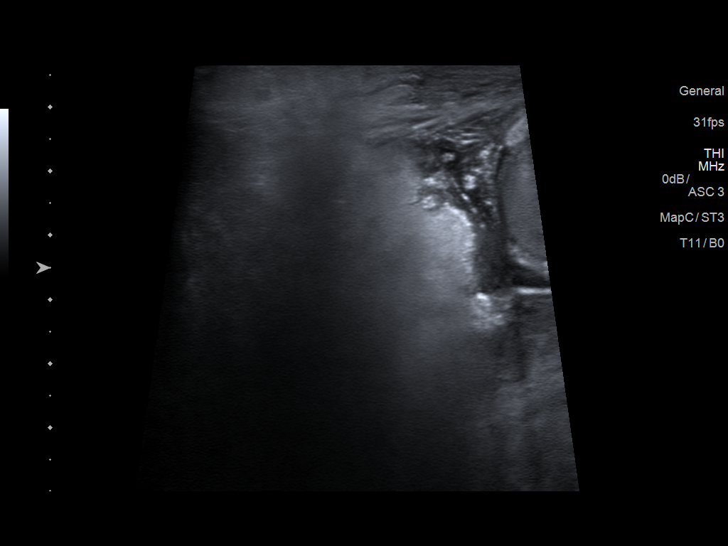
[im 9/12]
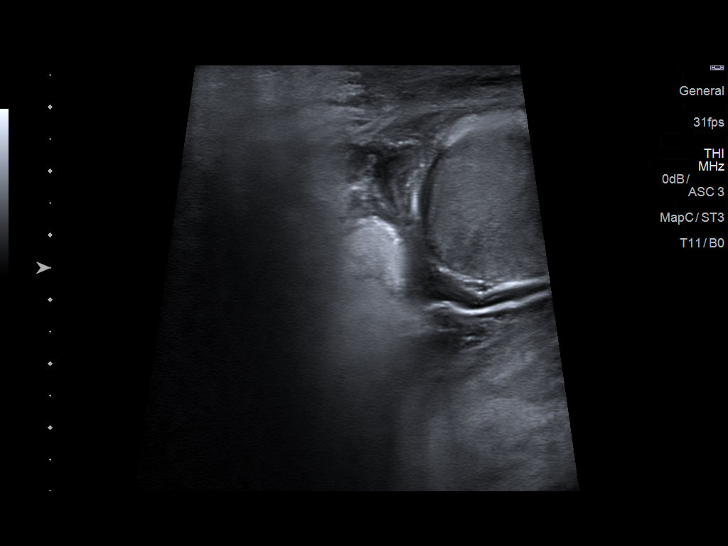
[im 10/12]
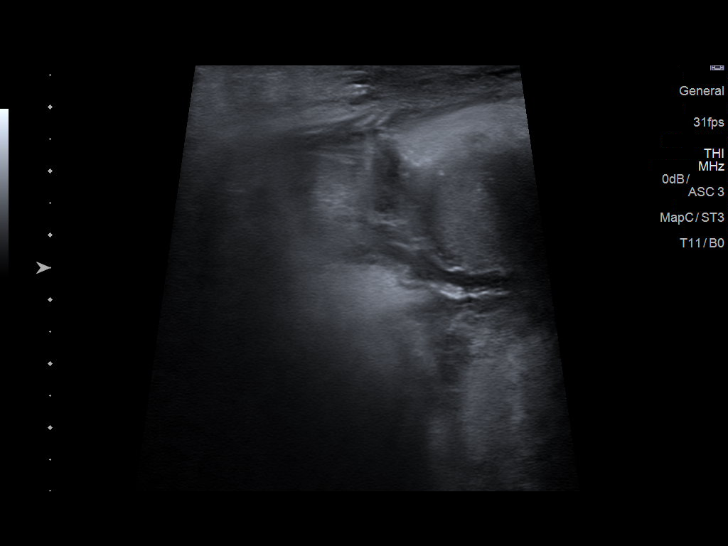
[im 11/12]
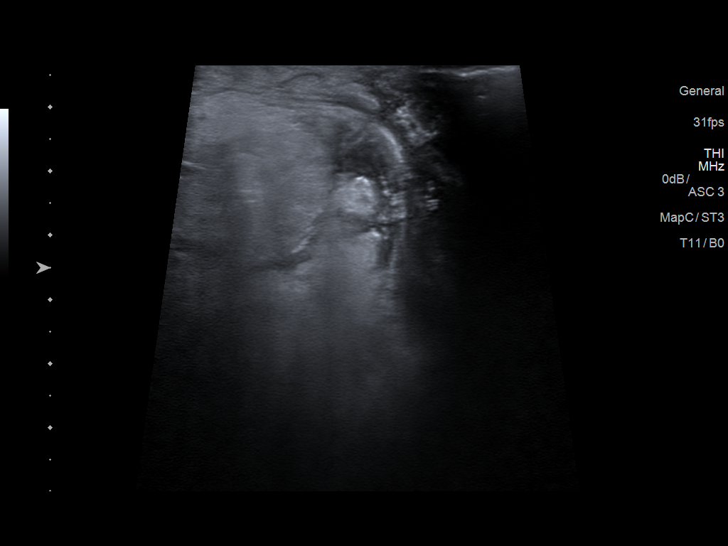
[im 12/12]
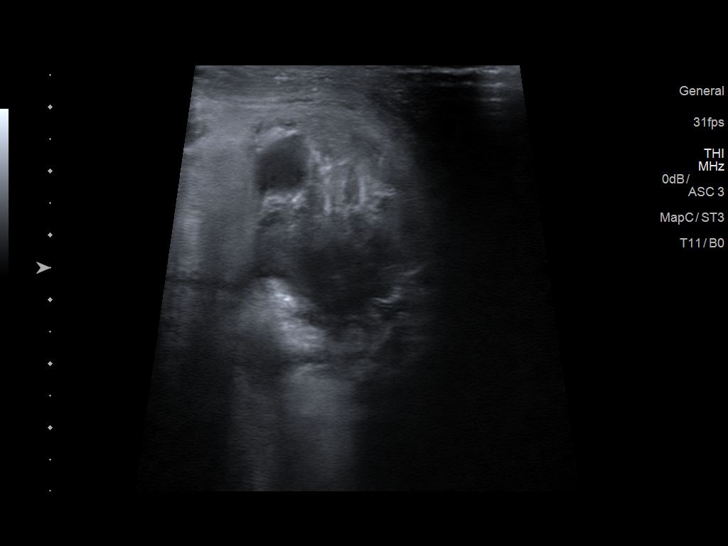

[12 of 12 positions shown; findings below may reference images not displayed]

FINDINGS: On prior CT patient has a fat containing LEFT inguinal hernia.

Prior CT also demonstrates a probable prior RIGHT inguinal hernia
repair.

On sonography, echogenic masslike focus is identified at the RIGHT
inguinal region which may may either represent sequela/deformity as
a result of prior RIGHT inguinal herniorrhaphy or a recurrent RIGHT
inguinal hernia. On the LEFT, a heterogeneous focus is seen at the
LEFT inguinal canal suspicious for a LEFT inguinal hernia containing
fat and fluid. No mass or adenopathy identified.
IMPRESSION: Known LEFT inguinal hernia by prior CT.

Apparent prior RIGHT inguinal hernia repair.

Sonography demonstrates a LEFT inguinal hernia and either
postsurgical changes or potential recurrent hernia at the RIGHT
inguinal region; further evaluation by a noncontrast CT exam would
be beneficial in assessing for potential recurrent RIGHT inguinal
hernia.

## 2019-05-26 ENCOUNTER — Encounter (HOSPITAL_COMMUNITY): Payer: Self-pay | Admitting: Emergency Medicine

## 2019-05-26 ENCOUNTER — Observation Stay (HOSPITAL_COMMUNITY)
Admission: EM | Admit: 2019-05-26 | Discharge: 2019-05-28 | Disposition: A | Discharge status: Home / Self Care | Payer: 59 | Attending: Internal Medicine | Admitting: Internal Medicine

## 2019-05-26 ENCOUNTER — Emergency Department (HOSPITAL_COMMUNITY): Payer: 59

## 2019-05-26 DIAGNOSIS — I4891 Unspecified atrial fibrillation: Secondary | ICD-10-CM

## 2019-05-26 DIAGNOSIS — J441 Chronic obstructive pulmonary disease with (acute) exacerbation: Secondary | ICD-10-CM | POA: Diagnosis not present

## 2019-05-26 DIAGNOSIS — I120 Hypertensive chronic kidney disease with stage 5 chronic kidney disease or end stage renal disease: Secondary | ICD-10-CM | POA: Diagnosis not present

## 2019-05-26 DIAGNOSIS — I519 Heart disease, unspecified: Secondary | ICD-10-CM | POA: Diagnosis not present

## 2019-05-26 DIAGNOSIS — R55 Syncope and collapse: Principal | ICD-10-CM

## 2019-05-26 DIAGNOSIS — I2510 Atherosclerotic heart disease of native coronary artery without angina pectoris: Secondary | ICD-10-CM | POA: Diagnosis present

## 2019-05-26 DIAGNOSIS — Z20822 Contact with and (suspected) exposure to covid-19: Secondary | ICD-10-CM | POA: Insufficient documentation

## 2019-05-26 DIAGNOSIS — F17210 Nicotine dependence, cigarettes, uncomplicated: Secondary | ICD-10-CM

## 2019-05-26 DIAGNOSIS — N186 End stage renal disease: Secondary | ICD-10-CM

## 2019-05-26 DIAGNOSIS — Z79899 Other long term (current) drug therapy: Secondary | ICD-10-CM | POA: Insufficient documentation

## 2019-05-26 DIAGNOSIS — I10 Essential (primary) hypertension: Secondary | ICD-10-CM | POA: Diagnosis not present

## 2019-05-26 DIAGNOSIS — M549 Dorsalgia, unspecified: Secondary | ICD-10-CM | POA: Diagnosis present

## 2019-05-26 DIAGNOSIS — R739 Hyperglycemia, unspecified: Secondary | ICD-10-CM | POA: Diagnosis present

## 2019-05-26 DIAGNOSIS — Z992 Dependence on renal dialysis: Secondary | ICD-10-CM | POA: Diagnosis not present

## 2019-05-26 DIAGNOSIS — E785 Hyperlipidemia, unspecified: Secondary | ICD-10-CM | POA: Insufficient documentation

## 2019-05-26 DIAGNOSIS — I252 Old myocardial infarction: Secondary | ICD-10-CM | POA: Insufficient documentation

## 2019-05-26 DIAGNOSIS — N185 Chronic kidney disease, stage 5: Secondary | ICD-10-CM

## 2019-05-26 DIAGNOSIS — I959 Hypotension, unspecified: Secondary | ICD-10-CM

## 2019-05-26 DIAGNOSIS — R42 Dizziness and giddiness: Secondary | ICD-10-CM | POA: Diagnosis present

## 2019-05-26 DIAGNOSIS — J449 Chronic obstructive pulmonary disease, unspecified: Secondary | ICD-10-CM | POA: Diagnosis not present

## 2019-05-26 DIAGNOSIS — E43 Unspecified severe protein-calorie malnutrition: Secondary | ICD-10-CM | POA: Diagnosis not present

## 2019-05-26 DIAGNOSIS — I255 Ischemic cardiomyopathy: Secondary | ICD-10-CM | POA: Diagnosis present

## 2019-05-26 DIAGNOSIS — Z7901 Long term (current) use of anticoagulants: Secondary | ICD-10-CM

## 2019-05-26 DIAGNOSIS — I482 Chronic atrial fibrillation, unspecified: Secondary | ICD-10-CM | POA: Diagnosis present

## 2019-05-26 DIAGNOSIS — Z9115 Patient's noncompliance with renal dialysis: Secondary | ICD-10-CM | POA: Insufficient documentation

## 2019-05-26 DIAGNOSIS — Z9114 Patient's other noncompliance with medication regimen: Secondary | ICD-10-CM

## 2019-05-26 DIAGNOSIS — R0602 Shortness of breath: Secondary | ICD-10-CM | POA: Diagnosis not present

## 2019-05-26 LAB — LIPASE, BLOOD: Lipase: 31 U/L (ref 11–51)

## 2019-05-26 LAB — APTT: aPTT: 31 seconds (ref 24–36)

## 2019-05-26 LAB — COMPREHENSIVE METABOLIC PANEL
ALT: 13 U/L (ref 0–44)
AST: 15 U/L (ref 15–41)
Albumin: 2.5 g/dL — ABNORMAL LOW (ref 3.5–5.0)
Alkaline Phosphatase: 115 U/L (ref 38–126)
Anion gap: 14 (ref 5–15)
BUN: 37 mg/dL — ABNORMAL HIGH (ref 8–23)
CO2: 24 mmol/L (ref 22–32)
Calcium: 7.3 mg/dL — ABNORMAL LOW (ref 8.9–10.3)
Chloride: 101 mmol/L (ref 98–111)
Creatinine, Ser: 6.24 mg/dL — ABNORMAL HIGH (ref 0.61–1.24)
GFR calc Af Amer: 10 mL/min — ABNORMAL LOW (ref >60)
GFR calc non Af Amer: 9 mL/min — ABNORMAL LOW (ref >60)
Glucose, Bld: 129 mg/dL — ABNORMAL HIGH (ref 70–99)
Potassium: 3.6 mmol/L (ref 3.5–5.1)
Sodium: 139 mmol/L (ref 135–145)
Total Bilirubin: 0.5 mg/dL (ref 0.3–1.2)
Total Protein: 6.1 g/dL — ABNORMAL LOW (ref 6.5–8.1)

## 2019-05-26 LAB — CBC WITH DIFFERENTIAL/PLATELET
Abs Immature Granulocytes: 0.05 10*3/uL (ref 0.00–0.07)
Basophils Absolute: 0.1 10*3/uL (ref 0.0–0.1)
Basophils Relative: 1 %
Eosinophils Absolute: 0.2 10*3/uL (ref 0.0–0.5)
Eosinophils Relative: 2 %
HCT: 39.2 % (ref 39.0–52.0)
Hemoglobin: 12.5 g/dL — ABNORMAL LOW (ref 13.0–17.0)
Immature Granulocytes: 1 %
Lymphocytes Relative: 9 %
Lymphs Abs: 0.9 10*3/uL (ref 0.7–4.0)
MCH: 32.6 pg (ref 26.0–34.0)
MCHC: 31.9 g/dL (ref 30.0–36.0)
MCV: 102.1 fL — ABNORMAL HIGH (ref 80.0–100.0)
Monocytes Absolute: 0.4 10*3/uL (ref 0.1–1.0)
Monocytes Relative: 4 %
Neutro Abs: 8.8 10*3/uL — ABNORMAL HIGH (ref 1.7–7.7)
Neutrophils Relative %: 83 %
Platelets: 189 10*3/uL (ref 150–400)
RBC: 3.84 MIL/uL — ABNORMAL LOW (ref 4.22–5.81)
RDW: 14.3 % (ref 11.5–15.5)
WBC: 10.5 10*3/uL (ref 4.0–10.5)
nRBC: 0 % (ref 0.0–0.2)

## 2019-05-26 LAB — PROTIME-INR
INR: 1 (ref 0.8–1.2)
Prothrombin Time: 13.1 seconds (ref 11.4–15.2)

## 2019-05-26 LAB — LACTIC ACID, PLASMA
Lactic Acid, Venous: 1.3 mmol/L (ref 0.5–1.9)
Lactic Acid, Venous: 0.8 mmol/L (ref 0.5–1.9)

## 2019-05-26 LAB — BRAIN NATRIURETIC PEPTIDE: B Natriuretic Peptide: 322.8 pg/mL — ABNORMAL HIGH (ref 0.0–100.0)

## 2019-05-26 LAB — AMMONIA: Ammonia: 25 umol/L (ref 9–35)

## 2019-05-26 MED ORDER — METOPROLOL TARTRATE 5 MG/5ML IV SOLN: 5.0000 mg | Freq: Four times a day (QID) | INTRAVENOUS | Status: DC | PRN

## 2019-05-26 MED ORDER — ALBUTEROL SULFATE (2.5 MG/3ML) 0.083% IN NEBU: 2.5000 mg | INHALATION_SOLUTION | Freq: Four times a day (QID) | RESPIRATORY_TRACT | Status: DC | PRN

## 2019-05-26 MED ORDER — ALBUTEROL SULFATE (2.5 MG/3ML) 0.083% IN NEBU: 3.0000 mL | INHALATION_SOLUTION | Freq: Three times a day (TID) | RESPIRATORY_TRACT | Status: DC | PRN

## 2019-05-26 MED ORDER — UMECLIDINIUM BROMIDE 62.5 MCG/INH IN AEPB
1.0000 | INHALATION_SPRAY | Freq: Every day | RESPIRATORY_TRACT | Status: DC
  Administered 2019-05-27 – 2019-05-28 (×2): 1 via RESPIRATORY_TRACT
  Filled 2019-05-26: qty 7

## 2019-05-26 MED ORDER — CALCIUM ACETATE (PHOS BINDER) 667 MG PO CAPS
667.0000 mg | ORAL_CAPSULE | Freq: Three times a day (TID) | ORAL | Status: DC
  Administered 2019-05-27 – 2019-05-28 (×4): 667 mg via ORAL
  Filled 2019-05-26 (×5): qty 1

## 2019-05-26 MED ORDER — PANTOPRAZOLE SODIUM 40 MG PO TBEC
40.0000 mg | DELAYED_RELEASE_TABLET | Freq: Every day | ORAL | Status: DC
  Administered 2019-05-27 – 2019-05-28 (×2): 40 mg via ORAL
  Filled 2019-05-26 (×3): qty 1

## 2019-05-26 MED ORDER — FLUTICASONE PROPIONATE 50 MCG/ACT NA SUSP
1.0000 | Freq: Every day | NASAL | Status: DC | PRN
  Filled 2019-05-26: qty 16

## 2019-05-26 MED ORDER — APIXABAN 2.5 MG PO TABS
2.5000 mg | ORAL_TABLET | Freq: Two times a day (BID) | ORAL | Status: DC
  Administered 2019-05-26 – 2019-05-27 (×2): 2.5 mg via ORAL
  Filled 2019-05-26 (×3): qty 1

## 2019-05-26 MED ORDER — ISOSORBIDE MONONITRATE ER 60 MG PO TB24
60.0000 mg | ORAL_TABLET | Freq: Every day | ORAL | Status: DC
  Administered 2019-05-27 – 2019-05-28 (×2): 60 mg via ORAL
  Filled 2019-05-26 (×2): qty 1

## 2019-05-26 MED ORDER — CLOPIDOGREL BISULFATE 75 MG PO TABS
75.0000 mg | ORAL_TABLET | Freq: Every day | ORAL | Status: DC
  Administered 2019-05-27 – 2019-05-28 (×2): 75 mg via ORAL
  Filled 2019-05-26 (×2): qty 1

## 2019-05-26 MED ORDER — FLUTICASONE FUROATE-VILANTEROL 200-25 MCG/INH IN AEPB
1.0000 | INHALATION_SPRAY | Freq: Every day | RESPIRATORY_TRACT | Status: DC
  Administered 2019-05-27: 1 via RESPIRATORY_TRACT
  Filled 2019-05-26: qty 28

## 2019-05-26 MED ORDER — HYDRALAZINE HCL 50 MG PO TABS
50.0000 mg | ORAL_TABLET | Freq: Three times a day (TID) | ORAL | Status: DC
  Administered 2019-05-26 – 2019-05-27 (×2): 50 mg via ORAL
  Filled 2019-05-26 (×2): qty 1

## 2019-05-26 MED ORDER — NEPRO/CARBSTEADY PO LIQD
237.0000 mL | Freq: Two times a day (BID) | ORAL | Status: DC
  Administered 2019-05-27: 15:00:00 237 mL via ORAL

## 2019-05-26 MED ORDER — DILTIAZEM HCL 60 MG PO TABS
60.0000 mg | ORAL_TABLET | Freq: Four times a day (QID) | ORAL | Status: DC
  Administered 2019-05-26 – 2019-05-28 (×5): 60 mg via ORAL
  Filled 2019-05-26 (×9): qty 1

## 2019-05-26 MED ORDER — SODIUM CHLORIDE 0.9% FLUSH
3.0000 mL | Freq: Two times a day (BID) | INTRAVENOUS | Status: DC
  Administered 2019-05-27 (×2): 3 mL via INTRAVENOUS

## 2019-05-26 MED ORDER — HYDROCODONE-ACETAMINOPHEN 5-325 MG PO TABS
1.0000 | ORAL_TABLET | ORAL | Status: DC | PRN
  Administered 2019-05-26 – 2019-05-28 (×5): 1 via ORAL
  Filled 2019-05-26 (×5): qty 1

## 2019-05-26 MED ORDER — GABAPENTIN 300 MG PO CAPS
300.0000 mg | ORAL_CAPSULE | Freq: Two times a day (BID) | ORAL | Status: DC
  Administered 2019-05-26 – 2019-05-28 (×4): 300 mg via ORAL
  Filled 2019-05-26 (×4): qty 1

## 2019-05-26 MED ORDER — SODIUM BICARBONATE 650 MG PO TABS
650.0000 mg | ORAL_TABLET | Freq: Three times a day (TID) | ORAL | Status: DC
  Administered 2019-05-26 – 2019-05-28 (×5): 650 mg via ORAL
  Filled 2019-05-26 (×6): qty 1

## 2019-05-26 MED ORDER — PREDNISONE 20 MG PO TABS
40.0000 mg | ORAL_TABLET | Freq: Every day | ORAL | Status: DC
  Administered 2019-05-26 – 2019-05-28 (×3): 40 mg via ORAL
  Filled 2019-05-26 (×3): qty 2

## 2019-05-26 NOTE — ED Triage Notes (Signed)
Pt abdomen is significantly distended.

## 2019-05-26 NOTE — Progress Notes (Signed)
Cardiology Consultation:   Patient ID: Aaron [name] MRN: [id number]; DOB: [birth year]  Admit date: 05/26/2019 Date of Consult: 05/26/2019  Primary Care Provider: [facility] [place] Primary Cardiologist: Minus Breeding, MD  Primary Electrophysiologist:  None    Patient Profile:   Aaron [name] is a 64 y.o. male with a hx of ESRD on PD, AF, CAD, COPD, HCV who is being seen today for the evaluation of syncope at the request of Dr. [name] Locks.  History of Present Illness:   Aaron [name] is a 64 year old male with the above medical history who was sent to the ED from his cardiologist office for multiple syncopal episodes.  He describes 2 syncopal episodes in the last few days.  Reports episodes all occurred with position change.  States that first episode occurred as he stood up to set up his dialysis equipment on Tuesday.  He began having blurry vision and lightheadedness and then he was on the floor.  States his friend witnessed the episode and he was unconscious for a few minutes.  Second episode occurred today, reports that he stood up to walk to the bathroom and then felt lightheaded/tunnel vision, and then passed out.  He denies any chest pain currently.  Reports he has intermittent chest pain, but then describes it is actually pain in his neck.  Describes sharp pain in neck that occurs every 2 to 3 days.  Occurs at rest.  Reports that he quit smoking last month.  Last TTE in 08/2017 in our system showed normal EF (55 to 123456), normal diastolic function, no significant valvular disease.  TTE at [facility] on 06/06/2018 showed mild systolic dysfunction, [id number], with inferior akinesis, mild MR.  TTE with [facility] in 03/2019 showed EF 50 to 55%.  Heart Pathway Score:     Past Medical History:  Diagnosis Date  . Anxiety   . Arthritis    knees , back , shoulders (10/12/2017)  . Atrial fibrillation ([place])   . CAD (coronary artery disease)    Mild nonobstructive disease at cardiac  catheterization 2002  . Chronic back pain    "back of my neck; down thru my legs" (10/12/2017)  . COPD (chronic obstructive pulmonary disease) ([place])   . Diverticulitis   . Esophageal reflux   . ESRD (end stage renal disease) on dialysis [facility])    "TTS; [place]" (11/23/2017)  . Essential hypertension   . Hepatitis C    states he no longer has this  . History of kidney stones   . History of syncope   . Hyperlipidemia   . Jerking 09/23/2014  . Myocardial infarction ([place]) 02/2017   "light one" (10/12/2017)  . Non-compliant behavior    non compliant with diaylsis per daughter  . PAT (paroxysmal atrial tachycardia) ([place])   . Peripheral arterial disease ([place])    Occluded left superficial femoral artery status post stent June 2016 - Dr. Trula Slade  . Pneumonia 1961  . Polycystic kidney, unspecified type   . Syncope 09/2014  . SYNCOPE 05/07/2010   Qualifier: Diagnosis of  By: Laurance Flatten RN, BSN, Anderson Malta      Past Surgical History:  Procedure Laterality Date  . ABDOMINAL AORTOGRAM N/A 08/11/2017   Procedure: ABDOMINAL AORTOGRAM;  Surgeon: Serafina Mitchell, MD;  Location: [place] CV LAB;  Service: Cardiovascular;  Laterality: N/A;  . ANTERIOR CERVICAL DECOMP/DISCECTOMY FUSION    . APPLICATION OF WOUND VAC Right 08/14/2017   Procedure: APPLICATION OF PREVENA INCISIONAL WOUND VAC RIGHT GROIN;  Surgeon: Trula Slade,  Butch Penny, MD;  Location: [facility] OR;  Service: Vascular;  Laterality: Right;  . AV FISTULA PLACEMENT Left 09/04/2017   Procedure: ARTERIOVENOUS (AV) FISTULA CREATION LEFT UPPER ARM;  Surgeon: Serafina Mitchell, MD;  Location: [place];  Service: Vascular;  Laterality: Left;  . BACK SURGERY    . BASCILIC VEIN TRANSPOSITION Left 11/20/2017   Procedure: SECOND STAGE BASILIC VEIN TRANSPOSITION LEFT ARM;  Surgeon: Serafina Mitchell, MD;  Location: [place];  Service: Vascular;  Laterality: Left;  . BIOPSY  12/17/2016   Procedure: BIOPSY;  Surgeon: Daneil Dolin, MD;  [facility];  Service:  Gastroenterology;;  gastric colon  . BIOPSY  04/29/2017   Procedure: BIOPSY;  Surgeon: Daneil Dolin, MD;  [facility];  Service: Endoscopy;;  duodenal biopsies  . BUNIONECTOMY Bilateral   . COLONOSCOPY  2008   Dr. Oneida Alar: rare sigmoid colon diverticulosis, internal hemorrhoids.   . COLONOSCOPY WITH PROPOFOL N/A 12/17/2016   dense left-sided diverticulosis, right colon ulcers s/p biopsy query occult NSAID use vs transient ischemia, not consistent with IBD. CMV stains negative.   Marland Kitchen ENDARTERECTOMY FEMORAL Right 08/14/2017   Procedure: RIGHT ILLIO-FEMORAL ENDARTERECTOMY;  Surgeon: Serafina Mitchell, MD;  Location: [facility] OR;  Service: Vascular;  Laterality: Right;  . ESOPHAGEAL DILATION  12/17/2016   EGD with mild Schatzki's ring s/p dilatation, small hiatal hernia, erosive gastropathy (negative H.pylori gastritis)  . ESOPHAGOGASTRODUODENOSCOPY  2008   Dr. Oneida Alar: normal esophagus without Barrett's, antritis and duodenitis, path with H.pylori gastritis  . ESOPHAGOGASTRODUODENOSCOPY (EGD) WITH PROPOFOL N/A 12/17/2016   Procedure: ESOPHAGOGASTRODUODENOSCOPY (EGD) WITH PROPOFOL;  Surgeon: Daneil Dolin, MD;  [facility];  Service: Gastroenterology;  Laterality: N/A;  . ESOPHAGOGASTRODUODENOSCOPY (EGD) WITH PROPOFOL N/A 04/29/2017   Patchy erythema of gastric mucosa diffusely, extensive inflammatory changes in duodenum, geographic ulceration and mucosal edema present, encroaching somewhat on the lumen yet still widely patent, distal second portion of duodenum appeared abnormal, path with peptic duodenitis with ulceration  . GROIN DEBRIDEMENT Right 10/14/2017   Procedure: RIGHT GROIN AND RIGHT LOWER QUADRANT ABDOMEN DEBRIDEMENT WITH PLACEMENT OF ANTIBIOTIC BEADS;  Surgeon: Serafina Mitchell, MD;  Location: [place];  Service: Vascular;  Laterality: Right;  . HEMATOMA EVACUATION Left 12/06/2017   Procedure: EVACUATION HEMATOMA;  Surgeon: Waynetta Sandy, MD;  Location: [place];  Service:  Vascular;  Laterality: Left;  . HERNIA REPAIR  [id number]   umbilical  . I & D EXTREMITY Right 09/21/2017   Procedure: IRRIGATION AND DEBRIDEMENT GROIN;  Surgeon: Elam Dutch, MD;  Location: [place];  Service: Vascular;  Laterality: Right;  . I & D EXTREMITY Left 11/23/2017   Procedure: Evacuation Hematoma LEFT UPPER ARM GRAFT;  Surgeon: Elam Dutch, MD;  Location: [place];  Service: Vascular;  Laterality: Left;  . INGUINAL HERNIA REPAIR Right 02/2017   [place]  . INSERTION OF DIALYSIS CATHETER Right 09/04/2017   Procedure: INSERTION OF TUNNELED  DIALYSIS CATHETER - RIGHT INTERNAL JUGULAR PLACEMENT;  Surgeon: Serafina Mitchell, MD;  Location: [place];  Service: Vascular;  Laterality: Right;  . INSERTION OF DIALYSIS CATHETER Right 07/26/2018   Procedure: INSERTION OF DIALYSIS CATHETER RIGHT INTERNAL JUGULAR;  Surgeon: Marty Heck, MD;  Location: [place];  Service: Vascular;  Laterality: Right;  . INSERTION OF ILIAC STENT Right 08/14/2017   Procedure: INSERTION OF RIGHT COMMON ILIAC STENT 16mm x 64mm x 130cm INSERTION OF RIGHT EXTERNAL ILIAC STENT 57mm x 49mm x 130cm INSERTION OF SUPERFICIAL  FERMORAL ARTERY STENT 74mm x 26mm x 130cm;  Surgeon: Serafina Mitchell, MD;  Location: [facility] OR;  Service: Vascular;  Laterality: Right;  . IR FLUORO GUIDE CV LINE RIGHT  08/31/2017  . IR REMOVAL TUN CV CATH W/O FL  05/28/2018  . IR REMOVAL TUN CV CATH W/O FL  03/07/2019  . IR US GUIDE VASC ACCESS RIGHT  08/31/2017  . LOWER EXTREMITY ANGIOGRAPHY Right 08/11/2017   Procedure: LOWER EXTREMITY ANGIOGRAPHY;  Surgeon: Serafina Mitchell, MD;  Location: [place] CV LAB;  Service: Cardiovascular;  Laterality: Right;  . PATCH ANGIOPLASTY Right 08/14/2017   Procedure: PATCH ANGIOPLASTY USING HEMASHIELD PATCH 0.3IN Lillie Columbia;  Surgeon: Serafina Mitchell, MD;  Location: [facility] OR;  Service: Vascular;  Laterality: Right;  . PERIPHERAL VASCULAR CATHETERIZATION N/A 09/20/2014   Procedure: Abdominal Aortogram;  Surgeon: Serafina Mitchell, MD;   Location: [place] CV LAB;  Service: Cardiovascular;  Laterality: N/A;  . POSTERIOR FUSION LUMBAR SPINE    . TEE WITHOUT CARDIOVERSION N/A 01/12/2018   Procedure: TRANSESOPHAGEAL ECHOCARDIOGRAM (TEE);  Surgeon: Lelon Perla, MD;  Location: [facility] ENDOSCOPY;  Service: Cardiovascular;  Laterality: N/A;      Inpatient Medications: Scheduled Meds: . apixaban  2.5 mg Oral BID  . [START ON 05/27/2019] calcium acetate  667 mg Oral TID WC  . [START ON 05/27/2019] clopidogrel  75 mg Oral Daily  . diltiazem  60 mg Oral Q6H  . fluticasone furoate-vilanterol  1 puff Inhalation Daily  . gabapentin  300 mg Oral BID  . hydrALAZINE  50 mg Oral Q8H  . [START ON 05/27/2019] isosorbide mononitrate  60 mg Oral Daily  . [START ON 05/27/2019] pantoprazole  40 mg Oral Daily  . predniSONE  40 mg Oral Q breakfast  . sodium bicarbonate  650 mg Oral TID  . sodium chloride flush  3 mL Intravenous Q12H  . umeclidinium bromide  1 puff Inhalation Daily   Continuous Infusions:  PRN Meds: albuterol, fluticasone, HYDROcodone-acetaminophen, metoprolol tartrate  Allergies:   No Known Allergies  Social History:   Social History   Socioeconomic History  . Marital status: Legally Separated    Spouse name: Not on file  . Number of children: 2  . Years of education: Not on file  . Highest education level: Not on file  Occupational History  . Occupation: DISABLED  Tobacco Use  . Smoking status: Current Every Day Smoker    Packs/day: 0.25    Years: 47.00    Pack years: 11.75    Types: Cigarettes    Last attempt to quit: 08/03/2017    Years since quitting: 1.8  . Smokeless tobacco: Never Used  Substance and Sexual Activity  . Alcohol use: Not Currently    Comment: 10/12/2017 alcohol free since 2017,  heavy drinker in the past  . Drug use: Not Currently    Comment: "<2003 whatever was around; nothing since"  . Sexual activity: Not Currently  Other Topics Concern  . Not on file  Social History Narrative    Disabled from Back since 2007   Social Determinants of Health   Financial Resource Strain:   . Difficulty of Paying Living Expenses: Not on file  Food Insecurity:   . Worried About Charity fundraiser in the Last Year: Not on file  . Ran Out of Food in the Last Year: Not on file  Transportation Needs:   . Lack of Transportation (Medical): Not on file  . Lack of Transportation (Non-Medical): Not  on file  Physical Activity:   . Days of Exercise per Week: Not on file  . Minutes of Exercise per Session: Not on file  Stress:   . Feeling of Stress : Not on file  Social Connections:   . Frequency of Communication with Friends and Family: Not on file  . Frequency of Social Gatherings with Friends and Family: Not on file  . Attends Religious Services: Not on file  . Active Member of Clubs or Organizations: Not on file  . Attends Archivist Meetings: Not on file  . Marital Status: Not on file  Intimate Partner Violence:   . Fear of Current or Ex-Partner: Not on file  . Emotionally Abused: Not on file  . Physically Abused: Not on file  . Sexually Abused: Not on file    Family History:    Family History  Problem Relation Age of Onset  . Alcoholism Mother   . Heart disease Father        Massive heart attack  . Heart attack Father   . Atrial fibrillation Father   . Colon cancer Father   . Colon cancer Maternal Grandfather 85  . Alcoholism Maternal Grandfather   . Renal cancer Cousin   . Ovarian cancer Sister      ROS:  Please see the history of present illness.   All other ROS reviewed and negative.     Physical Exam/Data:   Vitals:   05/26/19 1930 05/26/19 1945 05/26/19 2000 05/26/19 2015  BP: (!) 151/92 (!) 162/86 (!) 159/92 (!) 158/101  Pulse: 60   (!) 102  Resp: 18   15  Temp:      SpO2: 100%   100%  Weight:      Height:       No intake or output data in the 24 hours ending 05/26/19 2030 Last 3 Weights 05/26/2019 01/03/2019 12/22/2018  Weight (lbs) 191 lb  188 lb 181 lb  Weight (kg) 86.637 kg 85.276 kg 82.101 kg     Body mass index is 23.25 kg/m.  General:   in no acute distress HEENT: normal Lymph: no adenopathy Neck: no JVD Endocrine:  No thryomegaly Cardiac:  Distant heart sounds, no murmur  Lungs: Diffuse expiratory wheezing Abd: soft, nontender, no hepatomegaly  Ext: no edema Musculoskeletal:  No deformities Skin: warm and dry  Neuro:  no focal abnormalities noted Psych:  Normal affect   EKG:  The EKG was personally reviewed and demonstrates: Atrial fibrillation, rate 51, Q waves in V1-3, less than 1 mm ST depression in leads II, III, aVF, V5-6 Telemetry:  Telemetry was personally reviewed and demonstrates:  AF with rates in 50-70s  Relevant CV Studies:   Laboratory Data:  High Sensitivity Troponin:  No results for input(s): TROPONINIHS in the last 720 hours.   Chemistry Recent Labs  Lab 05/26/19 1541  NA 139  K 3.6  CL 101  CO2 24  GLUCOSE 129*  BUN 37*  CREATININE 6.24*  CALCIUM 7.3*  GFRNONAA 9*  GFRAA 10*  ANIONGAP 14    Recent Labs  Lab 05/26/19 1541  PROT 6.1*  ALBUMIN 2.5*  AST 15  ALT 13  ALKPHOS 115  BILITOT 0.5   Hematology Recent Labs  Lab 05/26/19 1541  WBC 10.5  RBC 3.84*  HGB 12.5*  HCT 39.2  MCV 102.1*  MCH 32.6  MCHC 31.9  RDW 14.3  PLT 189   BNP Recent Labs  Lab 05/26/19 1542  BNP 322.8*  DDimer No results for input(s): DDIMER in the last 168 hours.   Radiology/Studies:  [facility] Chest Port 1 View  Result Date: 05/26/2019 CLINICAL DATA:  Fainting episodes. No reported chest pain or shortness of breath. EXAM: PORTABLE CHEST 1 VIEW COMPARISON:  03/14/2019 FINDINGS: Cardiac silhouette is normal in size. No mediastinal or hilar masses. No evidence of adenopathy. There are prominent bronchovascular markings at the bases with relative lucency in the mid to upper lungs, findings consistent with emphysema. No evidence of pneumonia or pulmonary edema. No pleural effusion or  pneumothorax. Skeletal structures are grossly intact. IMPRESSION: 1. No acute cardiopulmonary disease. 2. COPD/emphysema.  Stable appearance from the prior study. Electronically Signed   By: Lajean Manes M.D.   On: 05/26/2019 16:34   {  Assessment and Plan:   Syncope: Description suggest orthostatic hypotension, has describes syncopal episodes with position change.  Vasovagal syncope also on differential, as describes prodromal symptoms.  Bradycardia is also on differential, as he is on high doses of Coreg and diltiazem and presented in AF with rates in the 50s.   - Will check orthostatics  - Will check TTE to evaluate for structural heart disease.   - Coreg discontinued, will hold beta-blockers for now as also being treated for COPD exacerbation   - Diltiazem switched to short-acting.  If significant systolic dysfunction on TTE, will need to avoid diltiazem. - Monitor on telemetry  Atrial fibrillation: CHA2DS2-VASc 2-3 (hypertension, CAD,+/-CHF).  On Eliquis  CAD: Reportedly had cath in 2002 showed mild nonobstructive disease.  Last SPECT in 2018 showed no reversible ischemia.  Denies any chest pain currently  Systolic dysfunction: EF [id number] on TTE at [facility] 05/2018, reportedly inferior akinesis.  Recheck TTE  PAD: Status post left SFA stent in 2016, stents to the right common iliac/right external iliac/right SFA in 2019.  Continue atorvastatin 80 mg daily.  On plavix  Hypertension: On Coreg, diltiazem, lisinopril, hydralazine.  Holding coreg as above.  Checking orthostatics as above, may need to back off antihypertensive regimen.   ESRD: On PD    For questions or updates, please contact [place] Please consult [email or web address] for contact info under     Signed, Donato Heinz, MD  05/26/2019 8:30 PM

## 2019-05-26 NOTE — ED Provider Notes (Signed)
[facility] EMERGENCY DEPARTMENT Provider Note   CSN: [id number] Arrival date & time: 05/26/19  1525     History Chief Complaint  Patient presents with  . Dizziness    Aaron [name] is a 64 y.o. male.  64 y.o male with a PMH of Anxiety, Afib, CAD, Hep C, MI presents to the ED via EMS from cardiologist office for syncopal episodes. Patient reports having multiple syncopal episodes during the past week. According to patient, he is has had multiple episodes for the past week including two in the cardiologist office today where he was placed on a wheelchair. He denies any pre provocative factors. He is currently on eliquis and Cardizem for his history of Afib. Denies any chest pain, shortness of breath, recent sickness. He recently quit smoking cigarettes as of January 25th.  He does endorse some shortness of breath but reports this is not out of his baseline.  No chest pain, does report some left-sided neck pain but no weakness.  The history is provided by the patient and medical records.       Past Medical History:  Diagnosis Date  . Anxiety   . Arthritis    knees , back , shoulders (10/12/2017)  . Atrial fibrillation ([place])   . CAD (coronary artery disease)    Mild nonobstructive disease at cardiac catheterization 2002  . Chronic back pain    "back of my neck; down thru my legs" (10/12/2017)  . COPD (chronic obstructive pulmonary disease) ([place])   . Diverticulitis   . Esophageal reflux   . ESRD (end stage renal disease) on dialysis [facility])    "TTS; [place]" (11/23/2017)  . Essential hypertension   . Hepatitis C    states he no longer has this  . History of kidney stones   . History of syncope   . Hyperlipidemia   . Jerking 09/23/2014  . Myocardial infarction ([place]) 02/2017   "light one" (10/12/2017)  . Non-compliant behavior    non compliant with diaylsis per daughter  . PAT (paroxysmal atrial tachycardia) ([place])   . Peripheral arterial disease ([place])    Occluded  left superficial femoral artery status post stent June 2016 - Dr. Trula Slade  . Pneumonia 1961  . Polycystic kidney, unspecified type   . Syncope 09/2014  . SYNCOPE 05/07/2010   Qualifier: Diagnosis of  By: Laurance Flatten RN, BSN, [facility]      Patient Active Problem List   Diagnosis Date Noted  . Vertebral osteomyelitis ([place]) 04/02/2018  . Back pain 03/31/2018  . Noncompliance with medication regimen 03/31/2018  . CKD (chronic kidney disease) stage 5, GFR less than 15 ml/min (HCC) 02/03/2018  . COPD with acute exacerbation ([place]) 02/03/2018  . Atrial fibrillation, chronic ([place]) 02/03/2018  . Discitis 01/27/2018  . Discitis of lumbar region 01/09/2018  . Paraspinal abscess ([place]) 01/08/2018  . ESRD on dialysis ([place]) 12/30/2017  . Hematoma of arm, right, initial encounter 11/23/2017  . Protein-calorie malnutrition, severe 10/15/2017  . Hyperlipidemia 08/24/2017  . Schatzki's ring of distal esophagus 04/27/2017  . Encounter for therapeutic drug monitoring 03/03/2017  . History of hepatitis C, naturally cleared  02/20/2017  . Ischemic cardiomyopathy 01/27/2017  . Dyslipidemia 01/27/2017  . AF (paroxysmal atrial fibrillation) ([place]) 01/27/2017  . CAD (coronary artery disease) 01/21/2017  . Polycystic kidney disease 12/14/2016  . COPD (chronic obstructive pulmonary disease) ([place])   . COLD (chronic obstructive lung disease) ([place])   . Depression   . PAD (peripheral artery disease) ([place]) 09/19/2014  .  Preop cardiovascular exam 10/28/2010  . Tobacco abuse 10/28/2010  . UNSPECIFIED IRON DEFICIENCY ANEMIA 10/04/2009  . 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  . Essential hypertension 10/04/2009  . Esophageal reflux 10/04/2009  . PRECORDIAL PAIN 10/04/2009    Past Surgical History:  Procedure Laterality Date  . ABDOMINAL AORTOGRAM N/A 08/11/2017   Procedure: ABDOMINAL AORTOGRAM;  Surgeon: Serafina Mitchell, MD;  Location: [facility];  Service: Cardiovascular;  Laterality: N/A;  . ANTERIOR CERVICAL  DECOMP/DISCECTOMY FUSION    . APPLICATION OF WOUND VAC Right 08/14/2017   Procedure: APPLICATION OF PREVENA INCISIONAL WOUND VAC RIGHT GROIN;  Surgeon: Serafina Mitchell, MD;  [facility];  Service: Vascular;  Laterality: Right;  . AV FISTULA PLACEMENT Left 09/04/2017   Procedure: ARTERIOVENOUS (AV) FISTULA CREATION LEFT UPPER ARM;  Surgeon: Serafina Mitchell, MD;  Location: [facility];  Service: Vascular;  Laterality: Left;  . BACK SURGERY    . BASCILIC VEIN TRANSPOSITION Left 11/20/2017   Procedure: SECOND STAGE BASILIC VEIN TRANSPOSITION LEFT ARM;  Surgeon: Serafina Mitchell, MD;  Location: [facility];  Service: Vascular;  Laterality: Left;  . BIOPSY  12/17/2016   Procedure: BIOPSY;  Surgeon: Daneil Dolin, MD;  [facility];  Service: Gastroenterology;;  gastric colon  . BIOPSY  04/29/2017   Procedure: BIOPSY;  Surgeon: Daneil Dolin, MD;  [facility];  Service: Endoscopy;;  duodenal biopsies  . BUNIONECTOMY Bilateral   . COLONOSCOPY  2008   Dr. Oneida Alar: rare sigmoid colon diverticulosis, internal hemorrhoids.   . COLONOSCOPY WITH PROPOFOL N/A 12/17/2016   dense left-sided diverticulosis, right colon ulcers s/p biopsy query occult NSAID use vs transient ischemia, not consistent with IBD. CMV stains negative.   Marland Kitchen ENDARTERECTOMY FEMORAL Right 08/14/2017   Procedure: RIGHT ILLIO-FEMORAL ENDARTERECTOMY;  Surgeon: Serafina Mitchell, MD;  Location: [facility] OR;  Service: Vascular;  Laterality: Right;  . ESOPHAGEAL DILATION  12/17/2016   EGD with mild Schatzki's ring s/p dilatation, small hiatal hernia, erosive gastropathy (negative H.pylori gastritis)  . ESOPHAGOGASTRODUODENOSCOPY  2008   Dr. Oneida Alar: normal esophagus without Barrett's, antritis and duodenitis, path with H.pylori gastritis  . ESOPHAGOGASTRODUODENOSCOPY (EGD) WITH PROPOFOL N/A 12/17/2016   Procedure: ESOPHAGOGASTRODUODENOSCOPY (EGD) WITH PROPOFOL;  Surgeon: Daneil Dolin, MD;  [facility];  Service: Gastroenterology;   Laterality: N/A;  . ESOPHAGOGASTRODUODENOSCOPY (EGD) WITH PROPOFOL N/A 04/29/2017   Patchy erythema of gastric mucosa diffusely, extensive inflammatory changes in duodenum, geographic ulceration and mucosal edema present, encroaching somewhat on the lumen yet still widely patent, distal second portion of duodenum appeared abnormal, path with peptic duodenitis with ulceration  . GROIN DEBRIDEMENT Right 10/14/2017   Procedure: RIGHT GROIN AND RIGHT LOWER QUADRANT ABDOMEN DEBRIDEMENT WITH PLACEMENT OF ANTIBIOTIC BEADS;  Surgeon: Serafina Mitchell, MD;  Location: [facility];  Service: Vascular;  Laterality: Right;  . HEMATOMA EVACUATION Left 12/06/2017   Procedure: EVACUATION HEMATOMA;  Surgeon: Waynetta Sandy, MD;  Location: [facility];  Service: Vascular;  Laterality: Left;  . HERNIA REPAIR  [id number]   umbilical  . I & D EXTREMITY Right 09/21/2017   Procedure: IRRIGATION AND DEBRIDEMENT GROIN;  Surgeon: Elam Dutch, MD;  Location: [facility];  Service: Vascular;  Laterality: Right;  . I & D EXTREMITY Left 11/23/2017   Procedure: Evacuation Hematoma LEFT UPPER ARM GRAFT;  Surgeon: Elam Dutch, MD;  Location: [facility];  Service: Vascular;  Laterality: Left;  . INGUINAL HERNIA REPAIR Right 02/2017   [place]  . INSERTION  OF DIALYSIS CATHETER Right 09/04/2017   Procedure: INSERTION OF TUNNELED  DIALYSIS CATHETER - RIGHT INTERNAL JUGULAR PLACEMENT;  Surgeon: Serafina Mitchell, MD;  Location: [facility];  Service: Vascular;  Laterality: Right;  . INSERTION OF DIALYSIS CATHETER Right 07/26/2018   Procedure: INSERTION OF DIALYSIS CATHETER RIGHT INTERNAL JUGULAR;  Surgeon: Marty Heck, MD;  Location: [facility];  Service: Vascular;  Laterality: Right;  . INSERTION OF ILIAC STENT Right 08/14/2017   Procedure: INSERTION OF RIGHT COMMON ILIAC STENT 46mm x 36mm x 130cm INSERTION OF RIGHT EXTERNAL ILIAC STENT 35mm x 83mm x 130cm INSERTION OF SUPERFICIAL FERMORAL ARTERY STENT 32mm x 8mm x 130cm;  Surgeon: Serafina Mitchell, MD;   Location[facility] OR;  Service: Vascular;  Laterality: Right;  . IR FLUORO GUIDE CV LINE RIGHT  08/31/2017  . IR REMOVAL TUN CV CATH W/O FL  05/28/2018  . IR REMOVAL TUN CV CATH W/O FL  03/07/2019  . IR US GUIDE VASC ACCESS RIGHT  08/31/2017  . LOWER EXTREMITY ANGIOGRAPHY Right 08/11/2017   Procedure: LOWER EXTREMITY ANGIOGRAPHY;  Surgeon: Serafina Mitchell, MD;  Location: [facility] CV LAB;  Service: Cardiovascular;  Laterality: Right;  . PATCH ANGIOPLASTY Right 08/14/2017   Procedure: PATCH ANGIOPLASTY USING HEMASHIELD PATCH 0.3IN Lillie Columbia;  Surgeon: Serafina Mitchell, MD;  [facility];  Service: Vascular;  Laterality: Right;  . PERIPHERAL VASCULAR CATHETERIZATION N/A 09/20/2014   Procedure: Abdominal Aortogram;  Surgeon: Serafina Mitchell, MD;  Location: [facility];  Service: Cardiovascular;  Laterality: N/A;  . POSTERIOR FUSION LUMBAR SPINE    . TEE WITHOUT CARDIOVERSION N/A 01/12/2018   Procedure: TRANSESOPHAGEAL ECHOCARDIOGRAM (TEE);  Surgeon: Lelon Perla, MD;  Location: [facility]COPY;  Service: Cardiovascular;  Laterality: N/A;       Family History  Problem Relation Age of Onset  . Alcoholism Mother   . Heart disease Father        Massive heart attack  . Heart attack Father   . Atrial fibrillation Father   . Colon cancer Father   . Colon cancer Maternal Grandfather 55  . Alcoholism Maternal Grandfather   . Renal cancer Cousin   . Ovarian cancer Sister     Social History   Tobacco Use  . Smoking status: Current Every Day Smoker    Packs/day: 0.25    Years: 47.00    Pack years: 11.75    Types: Cigarettes    Last attempt to quit: 08/03/2017    Years since quitting: 1.8  . Smokeless tobacco: Never Used  Substance Use Topics  . Alcohol use: Not Currently    Comment: 10/12/2017 alcohol free since 2017,  heavy drinker in the past  . Drug use: Not Currently    Comment: "<2003 whatever was around; nothing since"    Home Medications Prior to Admission medications     Medication Sig Start Date End Date Taking? Authorizing Provider  albuterol (PROVENTIL HFA;VENTOLIN HFA) 108 (90 Base) MCG/ACT inhaler Inhale 2 puffs into the lungs every 6 (six) hours as needed for wheezing or shortness of breath.    [provider]  albuterol (PROVENTIL) (2.5 MG/3ML) 0.083% nebulizer solution Inhale 3 mLs into the lungs 3 (three) times daily as needed for shortness of breath or wheezing. 06/23/18   [provider]  BREO ELLIPTA 200-25 MCG/INH AEPB Inhale 1 puff into the lungs daily as needed (for respiratory issues.).  07/27/17   [provider]  calcium acetate (PHOSLO) 667 MG capsule  Take 667 mg by mouth 3 (three) times daily with meals. 06/29/18   [provider]  cefTRIAXone 2 g in sodium chloride 0.9 % 100 mL Inject 2 g into the vein daily. Last dose 05/26/2018 04/04/18   TatShanon Brow, MD  Cholecalciferol (VITAMIN D3) 50 MCG (2000 UT) TABS Take 2,000 Units by mouth daily. 06/28/18   [provider]  clopidogrel (PLAVIX) 75 MG tablet Take 75 mg by mouth daily. 06/28/18   [provider]  diltiazem (CARDIZEM CD) 360 MG 24 hr capsule Take 1 capsule (360 mg total) by mouth daily. 02/09/18   Orson Eva, MD  ELIQUIS 2.5 MG TABS tablet Take 2.5 mg by mouth 2 (two) times daily. 06/29/18   [provider]  fluticasone (FLONASE) 50 MCG/ACT nasal spray Place 1 spray into both nostrils daily as needed for allergies.     [provider]  furosemide (LASIX) 40 MG tablet Take 1 tablet (40 mg total) by mouth daily. 02/09/18   Orson Eva, MD  furosemide (LASIX) 80 MG tablet Take 80 mg by mouth 2 (two) times daily. 06/23/18   [provider]  gabapentin (NEURONTIN) 300 MG capsule Take 300 mg by mouth 2 (two) times daily. 07/07/18   [provider]  hydrALAZINE (APRESOLINE) 50 MG tablet Take 50 mg by mouth 3 (three) times daily. 06/28/18   [provider]  HYDROcodone-acetaminophen (NORCO/VICODIN) 5-325 MG  tablet Take 1 tablet by mouth every 4 (four) hours as needed for moderate pain. 07/26/18 07/26/19  Marty Heck, MD  isosorbide mononitrate (IMDUR) 60 MG 24 hr tablet Take 60 mg by mouth daily. 07/07/18   [provider]  metoprolol tartrate (LOPRESSOR) 100 MG tablet Take 1 tablet (100 mg total) by mouth 2 (two) times daily. 02/08/18   Orson Eva, MD  nitroGLYCERIN (NITROSTAT) 0.4 MG SL tablet Place 0.4 mg under the tongue every 5 (five) minutes x 3 doses as needed for chest pain. 06/30/18   [provider]  oxyCODONE-acetaminophen (PERCOCET) 10-325 MG tablet Take 1 tablet by mouth every 4 (four) hours as needed for pain.    [provider]  pantoprazole (PROTONIX) 40 MG tablet Take 1 tablet (40 mg total) by mouth 2 (two) times daily. Patient taking differently: Take 40 mg by mouth daily.  04/30/17 01/27/26  Manuella Ghazi, Pratik D, DO  sodium bicarbonate 650 MG tablet Take 1 tablet (650 mg total) by mouth daily. Patient taking differently: Take 650 mg by mouth 3 (three) times daily.  04/04/18   Orson Eva, MD  traZODone (DESYREL) 50 MG tablet Take 75 mg by mouth at bedtime.     [provider]  vancomycin (VANCOCIN) 1-5 GM/200ML-% SOLN Inject 200 mLs (1,000 mg total) into the vein every other day. Last dose on 05/26/2018 04/05/18   Orson Eva, MD  warfarin (COUMADIN) 5 MG tablet Take 1 tablet (5 mg total) by mouth daily at 6 PM. 04/04/18   Orson Eva, MD    Allergies    Patient has no known allergies.  Review of Systems   Review of Systems  Constitutional: Negative for chills and fever.  HENT: Negative for ear pain and sore throat.   Eyes: Negative for pain and visual disturbance.  Respiratory: Negative for cough and shortness of breath.   Cardiovascular: Negative for chest pain and palpitations.  Gastrointestinal: Positive for abdominal distention. Negative for abdominal pain, diarrhea, nausea and vomiting.  Genitourinary: Negative for dysuria and hematuria.    Musculoskeletal: Negative for arthralgias  and back pain.  Skin: Negative for color change and rash.  Neurological: Positive for syncope. Negative for seizures.  All other systems reviewed and are negative.   Physical Exam Updated Vital Signs BP 101/74   Pulse 61   Temp 98.3 F (36.8 C)   Resp (!) 21   Ht 6\' 4"  (1.93 m)   Wt 86.6 kg   SpO2 97%   BMI 23.25 kg/m   Physical Exam Vitals and nursing note reviewed.  Constitutional:      Appearance: Normal appearance. He is ill-appearing.     Comments: Chronically ill-appearing.  HENT:     Head: Normocephalic and atraumatic.     Nose: Nose normal.     Mouth/Throat:     Mouth: Mucous membranes are moist.  Eyes:     Pupils: Pupils are equal, round, and reactive to light.     Comments: Pupils are equal and reactive.  Cardiovascular:     Rate and Rhythm: Rhythm irregular.  Pulmonary:     Effort: Pulmonary effort is normal.     Breath sounds: Rhonchi present. No wheezing or rales.     Comments: Sounds are diminished throughout.  Rhonchi present. Chest:     Chest wall: No tenderness.  Abdominal:     General: Abdomen is protuberant. There is distension.     Tenderness: There is no abdominal tenderness. There is no right CVA tenderness, left CVA tenderness or guarding. Negative signs include McBurney's sign.     Comments: Abdomen is significantly distended, no tenderness with palpation.  Bowel sounds are within normal limits.  Musculoskeletal:     Cervical back: Normal range of motion and neck supple.  Skin:    General: Skin is warm and dry.  Neurological:     Mental Status: He is alert and oriented to person, place, and time.     ED Results / Procedures / Treatments   Labs (all labs ordered are listed, but only abnormal results are displayed) Labs Reviewed  COMPREHENSIVE METABOLIC PANEL - Abnormal; Notable for the following components:      Result Value   Glucose, Bld 129 (*)    BUN 37 (*)    Creatinine, Ser 6.24 (*)     Calcium 7.3 (*)    Total Protein 6.1 (*)    Albumin 2.5 (*)    GFR calc non Af Amer 9 (*)    GFR calc Af Amer 10 (*)    All other components within normal limits  CBC WITH DIFFERENTIAL/PLATELET - Abnormal; Notable for the following components:   RBC 3.84 (*)    Hemoglobin 12.5 (*)    MCV 102.1 (*)    Neutro Abs 8.8 (*)    All other components within normal limits  BRAIN NATRIURETIC PEPTIDE - Abnormal; Notable for the following components:   B Natriuretic Peptide 322.8 (*)    All other components within normal limits  CULTURE, BLOOD (ROUTINE X 2)  CULTURE, BLOOD (ROUTINE X 2)  URINE CULTURE  LACTIC ACID, PLASMA  APTT  PROTIME-INR  LIPASE, BLOOD  AMMONIA  LACTIC ACID, PLASMA  URINALYSIS, ROUTINE W REFLEX MICROSCOPIC    EKG EKG Interpretation  Date/Time:  Thursday May 26 2019 15:48:31 EST Ventricular Rate:  51 PR Interval:    QRS Duration: 119 QT Interval:  486 QTC Calculation: 448 R Axis:   -7 Text Interpretation: Atrial fibrillation Nonspecific intraventricular conduction delay Anterior infarct, old Borderline repolarization abnormality Confirmed by Dene Gentry [phone]) on 05/26/2019 3:59:41 PM  Radiology DG Chest Port 1 View  Result Date: 05/26/2019 CLINICAL DATA:  Fainting episodes. No reported chest pain or shortness of breath. EXAM: PORTABLE CHEST 1 VIEW COMPARISON:  03/14/2019 FINDINGS: Cardiac silhouette is normal in size. No mediastinal or hilar masses. No evidence of adenopathy. There are prominent bronchovascular markings at the bases with relative lucency in the mid to upper lungs, findings consistent with emphysema. No evidence of pneumonia or pulmonary edema. No pleural effusion or pneumothorax. Skeletal structures are grossly intact. IMPRESSION: 1. No acute cardiopulmonary disease. 2. COPD/emphysema.  Stable appearance from the prior study. Electronically Signed   By: Lajean Manes M.D.   On: 05/26/2019 16:34    Procedures Procedures (including  critical care time)  Medications Ordered in ED Medications - No data to display  ED Course  I have reviewed the triage vital signs and the nursing notes.  Pertinent labs & imaging results that were available during my care of the patient were reviewed by me and considered in my medical decision making (see chart for details).    MDM Rules/Calculators/A&P   Patient understands a 5 medical history including 5 currently on diltiazem along with Eliquis, COPD, this to the ED sent in by [facility] cardiology at [facility] for syncopal episode.  Cording to patient he has had multiple syncopal episodes in the past week, does endorse some shortness of breath however this is not out of the ordinary for him, recently quit tobacco at the end of last month.  Patient does report abdomen has been more distended than usual, he does nightly peritoneal dialysis reports there has been increasing in swelling in his abdomen.  There is no pain with palpation of the abdomen, no bilateral pitting edema on his extremities, he does endorse some left-sided neck pain.  Received a 500 mL bolus via EMS bringing up his blood pressure to 101/74 from 82/58. Orthostatics were obtained which were unchanged.  Personally evaluated his labs, CMP remarkable for elevation in creatinine although improved from his previous visits.  No other electrolyte abnormality.  CBC without any leukocytosis, hemoglobin is at his baseline.  BNP improved from his previous visits.  Lipase is within normal limits.  Lactic acid was normal.  PT and INR at his baseline. [place] Syncope rule patient is NOT in low-risk group for serious outcome.  I reviewed chest x-ray and agree with stable COPD, no changes of increase in pulmonary edema.  Patient is not febrile, labs are within his baseline however due to complex medical history I feel that is appropriate for patient to be further evaluated as he has had multiple recurrent syncopal episodes currently on a blood  thinner as well.  Ultimately feel that patient might need sample from his peritoneal catheter.  6:08 PM Spoke to Dr. [name] Locks, who will evaluate and admit patient for further workup.  Portions of this note were generated with [employer]. Dictation errors may occur despite best attempts at proofreading.  Final Clinical Impression(s) / ED Diagnoses Final diagnoses:  Syncope, unspecified syncope type  Hypotension, unspecified hypotension type    Rx / DC Orders ED Discharge Orders    None       Janeece Fitting, PA-C 05/26/19 1811    Valarie Merino, MD 05/27/19 1350

## 2019-05-26 NOTE — ED Notes (Signed)
Attempted report- nurse to call back.

## 2019-05-26 NOTE — ED Triage Notes (Signed)
Per [facility], pt has had multiple syncopal episodes and feeling lightheaded. Pt does home peritoneal dialysis every night. Pt went to his cardiologist today who sent him here for evaluation. No pain, just off and on dizziness. BP 82/58, 56 HR, 96% RA.

## 2019-05-26 NOTE — H&P (Signed)
History and Physical    Aaron [name] [id number] DOB: [birth year] DOA: 05/26/2019  PCP: Gwenlyn Saran [facility]   Patient coming from: Home  I have personally briefly reviewed patient's old medical records in [place]  Chief Complaint: Passing out  HPI: Aaron [name] is a 64 y.o. male with medical history significant of  ESRD on PD, Afib, CAD, Hep C, COPD, MI sent from cardiologist office for syncopal episodes. Patient reports having 3 syncopal episodes during the past week.  First episode happened earlier this week, at home with a friend, he remembered he had blurry vision and lightheadedness before this happened, he was sitting in the sofa and then slighted down to the floor for few minutes.  Woke up later confused, but denied any body aching, no tongue biting no loss control of urine or bowel movement.  Today, he was in cardiologist office today where he had another episode with prodromes like lightheadedness, " feeling hot on my stomach", blurry vision, nurses and staff at the cardiologist office were able to place them in a wheelchair. He is currently on eliquis and Cardizem for his history of Afib. Denies any chest pain, shortness of breath, recent sickness. He quit smoking about 1 month ago.    He uses albuterol short of breath for about a year as well as Breo, all his breathing medications were prescribed by his PCP, he was never seen by a pulmonologist and he never had a lung function test. ED Course: Patient was found actively wheezing, blood pressure stable, no bradycardia.  Review of Systems: As per HPI otherwise 10 point review of systems negative.    Past Medical History:  Diagnosis Date  . Anxiety   . Arthritis    knees , back , shoulders (10/12/2017)  . Atrial fibrillation ([place])   . CAD (coronary artery disease)    Mild nonobstructive disease at cardiac catheterization 2002  . Chronic back pain    "back of my neck; down thru my legs" (10/12/2017)  .  COPD (chronic obstructive pulmonary disease) ([place])   . Diverticulitis   . Esophageal reflux   . ESRD (end stage renal disease) on dialysis [facility])    "TTS; [place]" (11/23/2017)  . Essential hypertension   . Hepatitis C    states he no longer has this  . History of kidney stones   . History of syncope   . Hyperlipidemia   . Jerking 09/23/2014  . Myocardial infarction ([place]) 02/2017   "light one" (10/12/2017)  . Non-compliant behavior    non compliant with diaylsis per daughter  . PAT (paroxysmal atrial tachycardia) ([place])   . Peripheral arterial disease ([place])    Occluded left superficial femoral artery status post stent June 2016 - Dr. Trula Slade  . Pneumonia 1961  . Polycystic kidney, unspecified type   . Syncope 09/2014  . SYNCOPE 05/07/2010   Qualifier: Diagnosis of  By: Laurance Flatten RN, BSN, Anderson Malta      Past Surgical History:  Procedure Laterality Date  . ABDOMINAL AORTOGRAM N/A 08/11/2017   Procedure: ABDOMINAL AORTOGRAM;  Surgeon: Serafina Mitchell, MD;  Location: [place] [facility];  Service: Cardiovascular;  Laterality: N/A;  . ANTERIOR CERVICAL DECOMP/DISCECTOMY FUSION    . APPLICATION OF WOUND VAC Right 08/14/2017   Procedure: APPLICATION OF PREVENA INCISIONAL WOUND VAC RIGHT GROIN;  Surgeon: Serafina Mitchell, MD;  [facility];  Service: Vascular;  Laterality: Right;  . AV FISTULA PLACEMENT Left 09/04/2017   Procedure: ARTERIOVENOUS (AV)  FISTULA CREATION LEFT UPPER ARM;  Surgeon: Serafina Mitchell, MD;  Location: [place];  Service: Vascular;  Laterality: Left;  . BACK SURGERY    . BASCILIC VEIN TRANSPOSITION Left 11/20/2017   Procedure: SECOND STAGE BASILIC VEIN TRANSPOSITION LEFT ARM;  Surgeon: Serafina Mitchell, MD;  Location: [place];  Service: Vascular;  Laterality: Left;  . BIOPSY  12/17/2016   Procedure: BIOPSY;  Surgeon: Daneil Dolin, MD;  [facility];  Service: Gastroenterology;;  gastric colon  . BIOPSY  04/29/2017   Procedure: BIOPSY;  Surgeon: Daneil Dolin, MD;   [facility];  Service: Endoscopy;;  duodenal biopsies  . BUNIONECTOMY Bilateral   . COLONOSCOPY  2008   Dr. Oneida Alar: rare sigmoid colon diverticulosis, internal hemorrhoids.   . COLONOSCOPY WITH PROPOFOL N/A 12/17/2016   dense left-sided diverticulosis, right colon ulcers s/p biopsy query occult NSAID use vs transient ischemia, not consistent with IBD. CMV stains negative.   Marland Kitchen ENDARTERECTOMY FEMORAL Right 08/14/2017   Procedure: RIGHT ILLIO-FEMORAL ENDARTERECTOMY;  Surgeon: Serafina Mitchell, MD;  Location: [facility] OR;  Service: Vascular;  Laterality: Right;  . ESOPHAGEAL DILATION  12/17/2016   EGD with mild Schatzki's ring s/p dilatation, small hiatal hernia, erosive gastropathy (negative H.pylori gastritis)  . ESOPHAGOGASTRODUODENOSCOPY  2008   Dr. Oneida Alar: normal esophagus without Barrett's, antritis and duodenitis, path with H.pylori gastritis  . ESOPHAGOGASTRODUODENOSCOPY (EGD) WITH PROPOFOL N/A 12/17/2016   Procedure: ESOPHAGOGASTRODUODENOSCOPY (EGD) WITH PROPOFOL;  Surgeon: Daneil Dolin, MD;  [facility];  Service: Gastroenterology;  Laterality: N/A;  . ESOPHAGOGASTRODUODENOSCOPY (EGD) WITH PROPOFOL N/A 04/29/2017   Patchy erythema of gastric mucosa diffusely, extensive inflammatory changes in duodenum, geographic ulceration and mucosal edema present, encroaching somewhat on the lumen yet still widely patent, distal second portion of duodenum appeared abnormal, path with peptic duodenitis with ulceration  . GROIN DEBRIDEMENT Right 10/14/2017   Procedure: RIGHT GROIN AND RIGHT LOWER QUADRANT ABDOMEN DEBRIDEMENT WITH PLACEMENT OF ANTIBIOTIC BEADS;  Surgeon: Serafina Mitchell, MD;  Location: [place];  Service: Vascular;  Laterality: Right;  . HEMATOMA EVACUATION Left 12/06/2017   Procedure: EVACUATION HEMATOMA;  Surgeon: Waynetta Sandy, MD;  Location: [place];  Service: Vascular;  Laterality: Left;  . HERNIA REPAIR  [id number]   umbilical  . I & D EXTREMITY Right 09/21/2017    Procedure: IRRIGATION AND DEBRIDEMENT GROIN;  Surgeon: Elam Dutch, MD;  Location: [place];  Service: Vascular;  Laterality: Right;  . I & D EXTREMITY Left 11/23/2017   Procedure: Evacuation Hematoma LEFT UPPER ARM GRAFT;  Surgeon: Elam Dutch, MD;  Location: [place];  Service: Vascular;  Laterality: Left;  . INGUINAL HERNIA REPAIR Right 02/2017   [place]  . INSERTION OF DIALYSIS CATHETER Right 09/04/2017   Procedure: INSERTION OF TUNNELED  DIALYSIS CATHETER - RIGHT INTERNAL JUGULAR PLACEMENT;  Surgeon: Serafina Mitchell, MD;  Location: [place];  Service: Vascular;  Laterality: Right;  . INSERTION OF DIALYSIS CATHETER Right 07/26/2018   Procedure: INSERTION OF DIALYSIS CATHETER RIGHT INTERNAL JUGULAR;  Surgeon: Marty Heck, MD;  Location: [place];  Service: Vascular;  Laterality: Right;  . INSERTION OF ILIAC STENT Right 08/14/2017   Procedure: INSERTION OF RIGHT COMMON ILIAC STENT 26mm x 46mm x 130cm INSERTION OF RIGHT EXTERNAL ILIAC STENT 31mm x 68mm x 130cm INSERTION OF SUPERFICIAL FERMORAL ARTERY STENT 46mm x 76mm x 130cm;  Surgeon: Serafina Mitchell, MD;  Location: [facility] OR;  Service: Vascular;  Laterality: Right;  .  IR FLUORO GUIDE CV LINE RIGHT  08/31/2017  . IR REMOVAL TUN CV CATH W/O FL  05/28/2018  . IR REMOVAL TUN CV CATH W/O FL  03/07/2019  . IR US GUIDE VASC ACCESS RIGHT  08/31/2017  . LOWER EXTREMITY ANGIOGRAPHY Right 08/11/2017   Procedure: LOWER EXTREMITY ANGIOGRAPHY;  Surgeon: Serafina Mitchell, MD;  Location: [place] CV LAB;  Service: Cardiovascular;  Laterality: Right;  . PATCH ANGIOPLASTY Right 08/14/2017   Procedure: PATCH ANGIOPLASTY USING HEMASHIELD PATCH 0.3IN Lillie Columbia;  Surgeon: Serafina Mitchell, MD;  [facility];  Service: Vascular;  Laterality: Right;  . PERIPHERAL VASCULAR CATHETERIZATION N/A 09/20/2014   Procedure: Abdominal Aortogram;  Surgeon: Serafina Mitchell, MD;  Location: [place] [facility] LAB;  Service: Cardiovascular;  Laterality: N/A;  . POSTERIOR FUSION LUMBAR  SPINE    . TEE WITHOUT CARDIOVERSION N/A 01/12/2018   Procedure: TRANSESOPHAGEAL ECHOCARDIOGRAM (TEE);  Surgeon: Lelon Perla, MD;  Location: [facility] ENDOSCOPY;  Service: Cardiovascular;  Laterality: N/A;     reports that he has been smoking cigarettes. He has a 11.75 pack-year smoking history. He has never used smokeless tobacco. He reports previous alcohol use. He reports previous drug use.  No Known Allergies  Family History  Problem Relation Age of Onset  . Alcoholism Mother   . Heart disease Father        Massive heart attack  . Heart attack Father   . Atrial fibrillation Father   . Colon cancer Father   . Colon cancer Maternal Grandfather 66  . Alcoholism Maternal Grandfather   . Renal cancer Cousin   . Ovarian cancer Sister      Prior to Admission medications   Medication Sig Start Date End Date Taking? Authorizing Provider  albuterol (PROVENTIL HFA;VENTOLIN HFA) 108 (90 Base) MCG/ACT inhaler Inhale 2 puffs into the lungs every 6 (six) hours as needed for wheezing or shortness of breath.    [provider]  albuterol (PROVENTIL) (2.5 MG/3ML) 0.083% nebulizer solution Inhale 3 mLs into the lungs 3 (three) times daily as needed for shortness of breath or wheezing. 06/23/18   [provider]  BREO ELLIPTA 200-25 MCG/INH AEPB Inhale 1 puff into the lungs daily as needed (for respiratory issues.).  07/27/17   [provider]  calcium acetate (PHOSLO) 667 MG capsule Take 667 mg by mouth 3 (three) times daily with meals. 06/29/18   [provider]  cefTRIAXone 2 g in sodium chloride 0.9 % 100 mL Inject 2 g into the vein daily. Last dose 05/26/2018 04/04/18   TatShanon Brow, MD  Cholecalciferol (VITAMIN D3) 50 MCG (2000 UT) TABS Take 2,000 Units by mouth daily. 06/28/18   [provider]  clopidogrel (PLAVIX) 75 MG tablet Take 75 mg by mouth daily. 06/28/18   [provider]  diltiazem (CARDIZEM CD) 360 MG 24 hr capsule Take 1 capsule (360  mg total) by mouth daily. 02/09/18   Orson Eva, MD  ELIQUIS 2.5 MG TABS tablet Take 2.5 mg by mouth 2 (two) times daily. 06/29/18   [provider]  fluticasone (FLONASE) 50 MCG/ACT nasal spray Place 1 spray into both nostrils daily as needed for allergies.     [provider]  furosemide (LASIX) 40 MG tablet Take 1 tablet (40 mg total) by mouth daily. 02/09/18   Orson Eva, MD  furosemide (LASIX) 80 MG tablet Take 80 mg by mouth 2 (two) times daily. 06/23/18   [provider]  gabapentin (NEURONTIN) 300 MG  capsule Take 300 mg by mouth 2 (two) times daily. 07/07/18   [provider]  hydrALAZINE (APRESOLINE) 50 MG tablet Take 50 mg by mouth 3 (three) times daily. 06/28/18   [provider]  HYDROcodone-acetaminophen (NORCO/VICODIN) 5-325 MG tablet Take 1 tablet by mouth every 4 (four) hours as needed for moderate pain. 07/26/18 07/26/19  Marty Heck, MD  isosorbide mononitrate (IMDUR) 60 MG 24 hr tablet Take 60 mg by mouth daily. 07/07/18   [provider]  metoprolol tartrate (LOPRESSOR) 100 MG tablet Take 1 tablet (100 mg total) by mouth 2 (two) times daily. 02/08/18   Orson Eva, MD  nitroGLYCERIN (NITROSTAT) 0.4 MG SL tablet Place 0.4 mg under the tongue every 5 (five) minutes x 3 doses as needed for chest pain. 06/30/18   [provider]  oxyCODONE-acetaminophen (PERCOCET) 10-325 MG tablet Take 1 tablet by mouth every 4 (four) hours as needed for pain.    [provider]  pantoprazole (PROTONIX) 40 MG tablet Take 1 tablet (40 mg total) by mouth 2 (two) times daily. Patient taking differently: Take 40 mg by mouth daily.  04/30/17 01/27/26  Manuella Ghazi, Pratik D, DO  sodium bicarbonate 650 MG tablet Take 1 tablet (650 mg total) by mouth daily. Patient taking differently: Take 650 mg by mouth 3 (three) times daily.  04/04/18   Orson Eva, MD  traZODone (DESYREL) 50 MG tablet Take 75 mg by mouth at bedtime.     [provider]  vancomycin (VANCOCIN) 1-5 GM/200ML-% SOLN Inject 200 mLs (1,000 mg total) into the vein every other day. Last dose on 05/26/2018 04/05/18   Orson Eva, MD  warfarin (COUMADIN) 5 MG tablet Take 1 tablet (5 mg total) by mouth daily at 6 PM. 04/04/18   Orson Eva, MD    Physical Exam: Vitals:   05/26/19 1542 05/26/19 1730 05/26/19 1845 05/26/19 1900  BP:  126/86 (!) 143/93 138/88  Pulse:  66 69 (!) 52  Resp:  20 20 13   Temp:      SpO2:  95% 100% 98%  Weight: 86.6 kg     Height: 6\' 4"  (1.93 m)       Constitutional: NAD, calm, comfortable Vitals:   05/26/19 1542 05/26/19 1730 05/26/19 1845 05/26/19 1900  BP:  126/86 (!) 143/93 138/88  Pulse:  66 69 (!) 52  Resp:  20 20 13   Temp:      SpO2:  95% 100% 98%  Weight: 86.6 kg     Height: 6\' 4"  (1.93 m)      Eyes: PERRL, lids and conjunctivae normal ENMT: Mucous membranes are moist. Posterior pharynx clear of any exudate or lesions.Normal dentition.  Neck: normal, supple, no masses, no thyromegaly Respiratory: Diminished breathing sound bilaterally with diffuse wheezing, no signs of using accessory muscle to breathe..  Cardiovascular: Irregular heartbeat, no murmurs / rubs / gallops. No extremity edema. 2+ pedal pulses. No carotid bruits.  Abdomen: no tenderness, no masses palpated. No hepatosplenomegaly. Bowel sounds positive.  Musculoskeletal: no clubbing / cyanosis. No joint deformity upper and lower extremities. Good ROM, no contractures. Normal muscle tone.  Skin: no rashes, lesions, ulcers. No induration Neurologic: CN 2-12 grossly intact. Sensation intact, DTR normal. Strength 5/5 in all 4.  Psychiatric: Normal judgment and insight. Alert and oriented x 3. Normal mood.     Labs on Admission: I have personally reviewed following labs and imaging studies  CBC: Recent Labs  Lab 05/26/19 1541  WBC 10.5  NEUTROABS 8.8*  HGB 12.5*  HCT 39.2  MCV 102.1*  PLT 99991111   Basic Metabolic Panel: Recent Labs  Lab 05/26/19 1541    NA 139  K 3.6  CL 101  CO2 24  GLUCOSE 129*  BUN 37*  CREATININE 6.24*  CALCIUM 7.3*   GFR: Estimated Creatinine Clearance: 15 mL/min (A) (by C-G formula based on SCr of 6.24 mg/dL (H)). Liver Function Tests: Recent Labs  Lab 05/26/19 1541  AST 15  ALT 13  ALKPHOS 115  BILITOT 0.5  PROT 6.1*  ALBUMIN 2.5*   Recent Labs  Lab 05/26/19 1541  LIPASE 31   Recent Labs  Lab 05/26/19 1542  AMMONIA 25   Coagulation Profile: Recent Labs  Lab 05/26/19 1541  INR 1.0   Cardiac Enzymes: No results for input(s): CKTOTAL, CKMB, CKMBINDEX, TROPONINI in the last 168 hours. BNP (last 3 results) No results for input(s): PROBNP in the last 8760 hours. HbA1C: No results for input(s): HGBA1C in the last 72 hours. CBG: No results for input(s): GLUCAP in the last 168 hours. Lipid Profile: No results for input(s): CHOL, HDL, LDLCALC, TRIG, CHOLHDL, LDLDIRECT in the last 72 hours. Thyroid Function Tests: No results for input(s): TSH, T4TOTAL, FREET4, T3FREE, THYROIDAB in the last 72 hours. Anemia Panel: No results for input(s): VITAMINB12, FOLATE, FERRITIN, TIBC, IRON, RETICCTPCT in the last 72 hours. Urine analysis:    Component Value Date/Time   COLORURINE AMBER (A) 03/31/2018 1801   APPEARANCEUR CLOUDY (A) 03/31/2018 1801   LABSPEC 1.011 03/31/2018 1801   PHURINE 6.0 03/31/2018 1801   GLUCOSEU NEGATIVE 03/31/2018 1801   HGBUR LARGE (A) 03/31/2018 1801   BILIRUBINUR NEGATIVE 03/31/2018 1801   KETONESUR NEGATIVE 03/31/2018 1801   PROTEINUR 100 (A) 03/31/2018 1801   UROBILINOGEN 0.2 03/03/2010 1056   NITRITE NEGATIVE 03/31/2018 1801   LEUKOCYTESUR LARGE (A) 03/31/2018 1801    Radiological Exams on Admission: DG Chest Port 1 View  Result Date: 05/26/2019 CLINICAL DATA:  Fainting episodes. No reported chest pain or shortness of breath. EXAM: PORTABLE CHEST 1 VIEW COMPARISON:  03/14/2019 FINDINGS: Cardiac silhouette is normal in size. No mediastinal or hilar masses. No  evidence of adenopathy. There are prominent bronchovascular markings at the bases with relative lucency in the mid to upper lungs, findings consistent with emphysema. No evidence of pneumonia or pulmonary edema. No pleural effusion or pneumothorax. Skeletal structures are grossly intact. IMPRESSION: 1. No acute cardiopulmonary disease. 2. COPD/emphysema.  Stable appearance from the prior study. Electronically Signed   By: Lajean Manes M.D.   On: 05/26/2019 16:34    EKG: Independently reviewed.  A. fib  Assessment/Plan Active Problems:   Syncope, vasovagal   Syncope  Syncope With prodromes, suspect vasovagal Per no significant blood pressure for the patient in the ED, will check orthostatic vital signs. Another possibility is paroxysmal bradycardia, will monitor him tonight, and Cardizem long-acting 2 short acting, change metoprolol to as needed for tachycardia. Overall suspect these episode related to his BP/rate control meds. Also check TSH and cortisol level.  Acute COPD exacerbation He is wheezing, new albuterol from clock and as needed, and Breo, also add Spiriva. For to outpatient pulmonology for formal lung function test.  ESRD on PD Discussed with on-call nephrologist Dr. Justin Mend, given that patient does not have any signs of fluid overload, or electrolyte imbalance or acidemia, will hold off tonight PD session.  Recheck BMP tomorrow morning.  Chronic A. Fib As above, change Cardizem to short acting, change beta-blocker to as needed.  Continue Eliquis.     DVT prophylaxis: Continue Eliquis Code Status: Full code Family Communication: None at bedside Disposition Plan: Likely can be discharged home once syncope work-up done Consults called: Nephro Dr. Justin Mend, Dr. Georgena Spurling a call to cardiology Dr. Gardiner Rhyme Admission status: Tele Obs   Lequita Halt MD [facility] Pager [phone]    05/26/2019, 7:44 PM

## 2019-05-27 ENCOUNTER — Observation Stay (HOSPITAL_BASED_OUTPATIENT_CLINIC_OR_DEPARTMENT_OTHER): Payer: 59

## 2019-05-27 ENCOUNTER — Other Ambulatory Visit: Payer: Self-pay

## 2019-05-27 DIAGNOSIS — I482 Chronic atrial fibrillation, unspecified: Secondary | ICD-10-CM | POA: Diagnosis not present

## 2019-05-27 DIAGNOSIS — R55 Syncope and collapse: Principal | ICD-10-CM

## 2019-05-27 DIAGNOSIS — I5022 Chronic systolic (congestive) heart failure: Secondary | ICD-10-CM | POA: Diagnosis not present

## 2019-05-27 DIAGNOSIS — I361 Nonrheumatic tricuspid (valve) insufficiency: Secondary | ICD-10-CM | POA: Diagnosis not present

## 2019-05-27 DIAGNOSIS — R739 Hyperglycemia, unspecified: Secondary | ICD-10-CM | POA: Diagnosis present

## 2019-05-27 LAB — GLUCOSE, CAPILLARY
Glucose-Capillary: 164 mg/dL — ABNORMAL HIGH (ref 70–99)
Glucose-Capillary: 154 mg/dL — ABNORMAL HIGH (ref 70–99)
Glucose-Capillary: 198 mg/dL — ABNORMAL HIGH (ref 70–99)

## 2019-05-27 LAB — BASIC METABOLIC PANEL
Anion gap: 12 (ref 5–15)
BUN: 39 mg/dL — ABNORMAL HIGH (ref 8–23)
CO2: 23 mmol/L (ref 22–32)
Calcium: 7.5 mg/dL — ABNORMAL LOW (ref 8.9–10.3)
Chloride: 101 mmol/L (ref 98–111)
Creatinine, Ser: 6.79 mg/dL — ABNORMAL HIGH (ref 0.61–1.24)
GFR calc Af Amer: 9 mL/min — ABNORMAL LOW (ref >60)
GFR calc non Af Amer: 8 mL/min — ABNORMAL LOW (ref >60)
Glucose, Bld: 195 mg/dL — ABNORMAL HIGH (ref 70–99)
Potassium: 3.7 mmol/L (ref 3.5–5.1)
Sodium: 136 mmol/L (ref 135–145)

## 2019-05-27 LAB — TSH: TSH: 0.891 u[IU]/mL (ref 0.350–4.500)

## 2019-05-27 LAB — URINALYSIS, ROUTINE W REFLEX MICROSCOPIC
Bilirubin Urine: NEGATIVE
Glucose, UA: 150 mg/dL — AB
Ketones, ur: NEGATIVE mg/dL
Nitrite: NEGATIVE
Protein, ur: 100 mg/dL — AB
Specific Gravity, Urine: 1.025 (ref 1.005–1.030)
pH: 6 (ref 5.0–8.0)

## 2019-05-27 LAB — SARS CORONAVIRUS 2 (TAT 6-24 HRS): SARS Coronavirus 2: NEGATIVE

## 2019-05-27 LAB — CORTISOL-AM, BLOOD: Cortisol - AM: 11.8 ug/dL (ref 6.7–22.6)

## 2019-05-27 LAB — HEMOGLOBIN A1C
Hgb A1c MFr Bld: 5.9 % — ABNORMAL HIGH (ref 4.8–5.6)
Mean Plasma Glucose: 122.63 mg/dL

## 2019-05-27 LAB — ECHOCARDIOGRAM COMPLETE
Height: 76 in
Weight: 3114.66 oz

## 2019-05-27 LAB — MRSA PCR SCREENING: MRSA by PCR: NEGATIVE

## 2019-05-27 LAB — HIV ANTIBODY (ROUTINE TESTING W REFLEX): HIV Screen 4th Generation wRfx: NONREACTIVE

## 2019-05-27 MED ORDER — APIXABAN 5 MG PO TABS
5.0000 mg | ORAL_TABLET | Freq: Two times a day (BID) | ORAL | Status: DC
  Administered 2019-05-27 – 2019-05-28 (×2): 5 mg via ORAL
  Filled 2019-05-27 (×2): qty 1

## 2019-05-27 MED ORDER — GENTAMICIN SULFATE 0.1 % EX CREA
1.0000 "application " | TOPICAL_CREAM | Freq: Every day | CUTANEOUS | Status: DC
  Administered 2019-05-27: 1 via TOPICAL
  Filled 2019-05-27: qty 15

## 2019-05-27 MED ORDER — VITAMIN D 25 MCG (1000 UNIT) PO TABS
2000.0000 [IU] | ORAL_TABLET | Freq: Every day | ORAL | Status: DC
  Administered 2019-05-27 – 2019-05-28 (×2): 2000 [IU] via ORAL
  Filled 2019-05-27 (×2): qty 2

## 2019-05-27 MED ORDER — HEPARIN 1000 UNIT/ML FOR PERITONEAL DIALYSIS
500.0000 [IU] | INTRAMUSCULAR | Status: DC | PRN
  Filled 2019-05-27: qty 0.5

## 2019-05-27 MED ORDER — INSULIN ASPART 100 UNIT/ML ~~LOC~~ SOLN
0.0000 [IU] | Freq: Three times a day (TID) | SUBCUTANEOUS | Status: DC
  Administered 2019-05-27: 17:00:00 1 [IU] via SUBCUTANEOUS

## 2019-05-27 MED ORDER — ALBUTEROL SULFATE (2.5 MG/3ML) 0.083% IN NEBU
2.5000 mg | INHALATION_SOLUTION | Freq: Four times a day (QID) | RESPIRATORY_TRACT | Status: DC
  Administered 2019-05-27 (×2): 2.5 mg via RESPIRATORY_TRACT
  Filled 2019-05-27 (×2): qty 3

## 2019-05-27 MED ORDER — HYDRALAZINE HCL 25 MG PO TABS
25.0000 mg | ORAL_TABLET | Freq: Three times a day (TID) | ORAL | Status: DC
  Administered 2019-05-27 – 2019-05-28 (×2): 25 mg via ORAL
  Filled 2019-05-27 (×2): qty 1

## 2019-05-27 MED ORDER — RENA-VITE PO TABS
1.0000 | ORAL_TABLET | Freq: Every day | ORAL | Status: DC
  Administered 2019-05-27: 22:00:00 1 via ORAL
  Filled 2019-05-27: qty 1

## 2019-05-27 NOTE — Consult Note (Signed)
[facility] Renal Consultation Note    Indication for Consultation:  Management of ESRD/hemodialysis; anemia, hypertension/volume and secondary hyperparathyroidism  HPI: Aaron [name] is a 64 y.o. 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 PMH also HTN, CAD, AFib, PAD, Hep C, COPD, lumbar discitis. He is admitted with recurrent syncope. Had syncopal episode in cardiology office prompting admission. Cardiology consulted here >>Echo, carotid dopplers pending.   Labs: K 3.7, BUN 39, Cr 6.79. Blood cultured neg to date. No edema/infiltrates on CXR. Seen and examined at bedside. No specific complaints. No further syncopal episodes. Has some occasional N/V at home. Back pain is an issue. Chronic DOE.   Nephrology consulted for CCPD. Dialysis center: [facility]. Dialysis dependent <1 year.  Has been using current PD cath x 4 months. He reports an episode of peritonitis last summer requiring removal of PD cath and temp HD. New PD cath placed 01/2019 per Dr. Lorelle Gibbs. No recent issues. Denies f,c,abd pain, cloudy effluent.   Past Medical History:  Diagnosis Date  . Anxiety   . Arthritis    knees , back , shoulders (10/12/2017)  . Atrial fibrillation ([place])   . CAD (coronary artery disease)    Mild nonobstructive disease at cardiac catheterization 2002  . Chronic back pain    "back of my neck; down thru my legs" (10/12/2017)  . COPD (chronic obstructive pulmonary disease) ([place])   . Diverticulitis   . Esophageal reflux   . ESRD (end stage renal disease) on dialysis [facility])    "TTS; [place]" (11/23/2017)  . Essential hypertension   . Hepatitis C    states he no longer has this  . History of kidney stones   . History of syncope   . Hyperlipidemia   . Jerking 09/23/2014  . Myocardial infarction ([place]) 02/2017   "light one" (10/12/2017)  . Non-compliant behavior    non compliant with diaylsis per daughter  . PAT (paroxysmal atrial tachycardia) ([place])   . Peripheral arterial disease ([place])    Occluded  left superficial femoral artery status post stent June 2016 - Dr. Trula Slade  . Pneumonia 1961  . Polycystic kidney, unspecified type   . Syncope 09/2014  . SYNCOPE 05/07/2010   Qualifier: Diagnosis of  By: Laurance Flatten RN, BSN, Anderson Malta     Past Surgical History:  Procedure Laterality Date  . ABDOMINAL AORTOGRAM N/A 08/11/2017   Procedure: ABDOMINAL AORTOGRAM;  Surgeon: Serafina Mitchell, MD;  Location: [place] CV LAB;  Service: Cardiovascular;  Laterality: N/A;  . ANTERIOR CERVICAL DECOMP/DISCECTOMY FUSION    . APPLICATION OF WOUND VAC Right 08/14/2017   Procedure: APPLICATION OF PREVENA INCISIONAL WOUND VAC RIGHT GROIN;  Surgeon: Serafina Mitchell, MD;  Location: [facility] OR;  Service: Vascular;  Laterality: Right;  . AV FISTULA PLACEMENT Left 09/04/2017   Procedure: ARTERIOVENOUS (AV) FISTULA CREATION LEFT UPPER ARM;  Surgeon: Serafina Mitchell, MD;  Location: [place];  Service: Vascular;  Laterality: Left;  . BACK SURGERY    . BASCILIC VEIN TRANSPOSITION Left 11/20/2017   Procedure: SECOND STAGE BASILIC VEIN TRANSPOSITION LEFT ARM;  Surgeon: Serafina Mitchell, MD;  Location: [place];  Service: Vascular;  Laterality: Left;  . BIOPSY  12/17/2016   Procedure: BIOPSY;  Surgeon: Daneil Dolin, MD;  [facility];  Service: Gastroenterology;;  gastric colon  . BIOPSY  04/29/2017   Procedure: BIOPSY;  Surgeon: Daneil Dolin, MD;  [facility];  Service: Endoscopy;;  duodenal biopsies  . BUNIONECTOMY  Bilateral   . COLONOSCOPY  2008   Dr. Oneida Alar: rare sigmoid colon diverticulosis, internal hemorrhoids.   . COLONOSCOPY WITH PROPOFOL N/A 12/17/2016   dense left-sided diverticulosis, right colon ulcers s/p biopsy query occult NSAID use vs transient ischemia, not consistent with IBD. CMV stains negative.   Marland Kitchen ENDARTERECTOMY FEMORAL Right 08/14/2017   Procedure: RIGHT ILLIO-FEMORAL ENDARTERECTOMY;  Surgeon: Serafina Mitchell, MD;  Location[facility] OR;  Service: Vascular;  Laterality: Right;  . ESOPHAGEAL  DILATION  12/17/2016   EGD with mild Schatzki's ring s/p dilatation, small hiatal hernia, erosive gastropathy (negative H.pylori gastritis)  . ESOPHAGOGASTRODUODENOSCOPY  2008   Dr. Oneida Alar: normal esophagus without Barrett's, antritis and duodenitis, path with H.pylori gastritis  . ESOPHAGOGASTRODUODENOSCOPY (EGD) WITH PROPOFOL N/A 12/17/2016   Procedure: ESOPHAGOGASTRODUODENOSCOPY (EGD) WITH PROPOFOL;  Surgeon: Daneil Dolin, MD;  [facility];  Service: Gastroenterology;  Laterality: N/A;  . ESOPHAGOGASTRODUODENOSCOPY (EGD) WITH PROPOFOL N/A 04/29/2017   Patchy erythema of gastric mucosa diffusely, extensive inflammatory changes in duodenum, geographic ulceration and mucosal edema present, encroaching somewhat on the lumen yet still widely patent, distal second portion of duodenum appeared abnormal, path with peptic duodenitis with ulceration  . GROIN DEBRIDEMENT Right 10/14/2017   Procedure: RIGHT GROIN AND RIGHT LOWER QUADRANT ABDOMEN DEBRIDEMENT WITH PLACEMENT OF ANTIBIOTIC BEADS;  Surgeon: Serafina Mitchell, MD;  Location: [place];  Service: Vascular;  Laterality: Right;  . HEMATOMA EVACUATION Left 12/06/2017   Procedure: EVACUATION HEMATOMA;  Surgeon: Waynetta Sandy, MD;  Location: [place];  Service: Vascular;  Laterality: Left;  . HERNIA REPAIR  [id number]   umbilical  . I & D EXTREMITY Right 09/21/2017   Procedure: IRRIGATION AND DEBRIDEMENT GROIN;  Surgeon: Elam Dutch, MD;  Location: [place];  Service: Vascular;  Laterality: Right;  . I & D EXTREMITY Left 11/23/2017   Procedure: Evacuation Hematoma LEFT UPPER ARM GRAFT;  Surgeon: Elam Dutch, MD;  Location: [place];  Service: Vascular;  Laterality: Left;  . INGUINAL HERNIA REPAIR Right 02/2017   [place]  . INSERTION OF DIALYSIS CATHETER Right 09/04/2017   Procedure: INSERTION OF TUNNELED  DIALYSIS CATHETER - RIGHT INTERNAL JUGULAR PLACEMENT;  Surgeon: Serafina Mitchell, MD;  Location: [place];  Service: Vascular;  Laterality:  Right;  . INSERTION OF DIALYSIS CATHETER Right 07/26/2018   Procedure: INSERTION OF DIALYSIS CATHETER RIGHT INTERNAL JUGULAR;  Surgeon: Marty Heck, MD;  Location: [place];  Service: Vascular;  Laterality: Right;  . INSERTION OF ILIAC STENT Right 08/14/2017   Procedure: INSERTION OF RIGHT COMMON ILIAC STENT 26mm x 74mm x 130cm INSERTION OF RIGHT EXTERNAL ILIAC STENT 35mm x 66mm x 130cm INSERTION OF SUPERFICIAL FERMORAL ARTERY STENT 66mm x 80mm x 130cm;  Surgeon: Serafina Mitchell, MD;  [facility] OR;  Service: Vascular;  Laterality: Right;  . IR FLUORO GUIDE CV LINE RIGHT  08/31/2017  . IR REMOVAL TUN CV CATH W/O FL  05/28/2018  . IR REMOVAL TUN CV CATH W/O FL  03/07/2019  . IR US GUIDE VASC ACCESS RIGHT  08/31/2017  . LOWER EXTREMITY ANGIOGRAPHY Right 08/11/2017   Procedure: LOWER EXTREMITY ANGIOGRAPHY;  Surgeon: Serafina Mitchell, MD;  Location: [place] CV LAB;  Service: Cardiovascular;  Laterality: Right;  . PATCH ANGIOPLASTY Right 08/14/2017   Procedure: PATCH ANGIOPLASTY USING HEMASHIELD PATCH 0.3IN Lillie Columbia;  Surgeon: Serafina Mitchell, MD;  [facility];  Service: Vascular;  Laterality: Right;  . PERIPHERAL VASCULAR CATHETERIZATION N/A 09/20/2014   Procedure:  Abdominal Aortogram;  Surgeon: Serafina Mitchell, MD;  Location: [place] CV LAB;  Service: Cardiovascular;  Laterality: N/A;  . POSTERIOR FUSION LUMBAR SPINE    . TEE WITHOUT CARDIOVERSION N/A 01/12/2018   Procedure: TRANSESOPHAGEAL ECHOCARDIOGRAM (TEE);  Surgeon: Lelon Perla, MD;  Location: [facility] ENDOSCOPY;  Service: Cardiovascular;  Laterality: N/A;   Family History  Problem Relation Age of Onset  . Alcoholism Mother   . Heart disease Father        Massive heart attack  . Heart attack Father   . Atrial fibrillation Father   . Colon cancer Father   . Colon cancer Maternal Grandfather 56  . Alcoholism Maternal Grandfather   . Renal cancer Cousin   . Ovarian cancer Sister    Social History:  reports that he has been  smoking cigarettes. He has a 11.75 pack-year smoking history. He has never used smokeless tobacco. He reports previous alcohol use. He reports previous drug use. No Known Allergies Prior to Admission medications   Medication Sig Start Date End Date Taking? Authorizing Provider  albuterol (PROVENTIL HFA;VENTOLIN HFA) 108 (90 Base) MCG/ACT inhaler Inhale 2 puffs into the lungs every 6 (six) hours as needed for wheezing or shortness of breath.   Yes [provider]  albuterol (PROVENTIL) (2.5 MG/3ML) 0.083% nebulizer solution Inhale 3 mLs into the lungs 3 (three) times daily as needed for shortness of breath or wheezing. 06/23/18  Yes [provider]  atorvastatin (LIPITOR) 80 MG tablet Take 80 mg by mouth daily. 04/25/19  Yes [provider]  BREO ELLIPTA 200-25 MCG/INH AEPB Inhale 1 puff into the lungs daily as needed (for respiratory issues.).  07/27/17  Yes [provider]  calcium acetate (PHOSLO) 667 MG capsule Take 667 mg by mouth 3 (three) times daily with meals. 06/29/18  Yes [provider]  carvedilol (COREG) 25 MG tablet Take 25 mg by mouth 2 (two) times daily. 04/25/19  Yes [provider]  CHANTIX 0.5 MG tablet Take 0.5 mg by mouth daily. 04/13/19  Yes [provider]  Cholecalciferol (VITAMIN D3) 50 MCG (2000 UT) TABS Take 2,000 Units by mouth daily. 06/28/18  Yes [provider]  clopidogrel (PLAVIX) 75 MG tablet Take 75 mg by mouth daily. 06/28/18  Yes [provider]  diltiazem (CARDIZEM CD) 360 MG 24 hr capsule Take 1 capsule (360 mg total) by mouth daily. 02/09/18  Yes Tat, Shanon Brow, MD  ELIQUIS 2.5 MG TABS tablet Take 2.5 mg by mouth 2 (two) times daily. 06/29/18  Yes [provider]  furosemide (LASIX) 40 MG tablet Take 1 tablet (40 mg total) by mouth daily. 02/09/18  Yes Tat, Shanon Brow, MD  gabapentin (NEURONTIN) 300 MG capsule Take 300 mg by mouth 2 (two) times daily. 07/07/18  Yes [provider]   hydrALAZINE (APRESOLINE) 50 MG tablet Take 50 mg by mouth 3 (three) times daily. 06/28/18  Yes [provider]  isosorbide mononitrate (IMDUR) 60 MG 24 hr tablet Take 60 mg by mouth daily. 07/07/18  Yes [provider]  lisinopril (ZESTRIL) 20 MG tablet Take 40 mg by mouth daily. 04/25/19  Yes [provider]  metoprolol tartrate (LOPRESSOR) 100 MG tablet Take 1 tablet (100 mg total) by mouth 2 (two) times daily. 02/08/18  Yes Tat, Shanon Brow, MD  nitroGLYCERIN (NITROSTAT) 0.4 MG SL tablet Place 0.4 mg under the tongue every 5 (five) minutes x 3 doses as needed for chest pain. 06/30/18  Yes [provider]  oxyCODONE-acetaminophen (  PERCOCET) 10-325 MG tablet Take 1 tablet by mouth 5 (five) times daily as needed for pain.    Yes [provider]  pantoprazole (PROTONIX) 40 MG tablet Take 1 tablet (40 mg total) by mouth 2 (two) times daily. Patient taking differently: Take 40 mg by mouth daily.  04/30/17 01/27/26 Yes Shah, Pratik D, DO  sodium bicarbonate 650 MG tablet Take 1 tablet (650 mg total) by mouth daily. Patient taking differently: Take 650 mg by mouth 3 (three) times daily.  04/04/18  Yes Tat, Shanon Brow, MD  cefTRIAXone 2 g in sodium chloride 0.9 % 100 mL Inject 2 g into the vein daily. Last dose 05/26/2018 Patient not taking: Reported on 05/26/2019 04/04/18   Orson Eva, MD  HYDROcodone-acetaminophen (NORCO/VICODIN) 5-325 MG tablet Take 1 tablet by mouth every 4 (four) hours as needed for moderate pain. Patient not taking: Reported on 05/26/2019 07/26/18 07/26/19  Marty Heck, MD  vancomycin (VANCOCIN) 1-5 GM/200ML-% SOLN Inject 200 mLs (1,000 mg total) into the vein every other day. Last dose on 05/26/2018 Patient not taking: Reported on 05/26/2019 04/05/18   Orson Eva, MD  warfarin (COUMADIN) 5 MG tablet Take 1 tablet (5 mg total) by mouth daily at 6 PM. Patient not taking: Reported on 05/26/2019 04/04/18   Orson Eva, MD   Current  Facility-Administered Medications  Medication Dose Route Frequency Provider Last Rate Last Admin  . albuterol (PROVENTIL) (2.5 MG/3ML) 0.083% nebulizer solution 2.5 mg  2.5 mg Inhalation Q6H Radene Gunning, NP   2.5 mg at 05/27/19 1440  . apixaban (ELIQUIS) tablet 5 mg  5 mg Oral BID Carney, Gay Filler, RPH      . calcium acetate (PHOSLO) capsule 667 mg  667 mg Oral TID WC Lequita Halt, MD   667 mg at 05/27/19 1207  . clopidogrel (PLAVIX) tablet 75 mg  75 mg Oral Daily Wynetta Fines T, MD   75 mg at 05/27/19 0929  . diltiazem (CARDIZEM) tablet 60 mg  60 mg Oral Q6H Wynetta Fines T, MD   60 mg at 05/27/19 0530  . feeding supplement (NEPRO CARB STEADY) liquid 237 mL  237 mL Oral BID BM Zhang, Pearletha Forge T, MD      . fluticasone (FLONASE) 50 MCG/ACT nasal spray 1 spray  1 spray Each Nare Daily PRN Wynetta Fines T, MD      . fluticasone furoate-vilanterol (BREO ELLIPTA) 200-25 MCG/INH 1 puff  1 puff Inhalation Daily Lequita Halt, MD   1 puff at 05/27/19 0731  . gabapentin (NEURONTIN) capsule 300 mg  300 mg Oral BID Wynetta Fines T, MD   300 mg at 05/27/19 [id number]  . hydrALAZINE (APRESOLINE) tablet 25 mg  25 mg Oral Q8H Vann, Jessica U, DO      . HYDROcodone-acetaminophen (NORCO/VICODIN) 5-325 MG per tablet 1 tablet  1 tablet Oral Q4H PRN Lequita Halt, MD   1 tablet at 05/27/19 0934  . insulin aspart (novoLOG) injection 0-6 Units  0-6 Units Subcutaneous TID WC Black, Lezlie Octave, NP      . isosorbide mononitrate (IMDUR) 24 hr tablet 60 mg  60 mg Oral Daily Wynetta Fines T, MD   60 mg at 05/27/19 0929  . metoprolol tartrate (LOPRESSOR) injection 5 mg  5 mg Intravenous Q6H PRN Wynetta Fines T, MD      . multivitamin (RENA-VIT) tablet 1 tablet  1 tablet Oral QHS Vann, Jessica U, DO      . pantoprazole (PROTONIX) EC tablet 40 mg  40 mg Oral Daily Wynetta Fines T, MD   40 mg at 05/27/19 0929  . predniSONE (DELTASONE) tablet 40 mg  40 mg Oral Q breakfast Wynetta Fines T, MD   40 mg at 05/27/19 0929  . sodium bicarbonate tablet 650 mg   650 mg Oral TID Lequita Halt, MD   650 mg at 05/27/19 0929  . sodium chloride flush (NS) 0.9 % injection 3 mL  3 mL Intravenous Q12H Wynetta Fines T, MD      . umeclidinium bromide (INCRUSE ELLIPTA) 62.5 MCG/INH 1 puff  1 puff Inhalation Daily Wynetta Fines T, MD   1 puff at 05/27/19 0732     ROS: As per HPI otherwise negative.  Physical Exam: Vitals:   05/26/19 2043 05/26/19 2045 05/27/19 0430 05/27/19 1207  BP:   132/74 (!) 81/70  Pulse:   69   Resp: 20  18   Temp: 97.7 F (36.5 C)  98.2 F (36.8 C)   TempSrc: Oral  Oral   SpO2:   94%   Weight:  88.3 kg 88.3 kg   Height:  6\' 4"  (1.93 m)       General: alert, lying in bed, nad  Head: NCAT sclera not icteric MMM Neck: Supple. No JVD No masses Lungs: CTA bilaterally without wheezes, rales, or rhonchi. Breathing is unlabored. Heart: RRR with S1 S2 Abdomen: soft mild distention non-tender PD cath dsg clean  Lower extremities:without edema or ischemic changes, no open wounds  Neuro: A & O  X 3. Moves all extremities spontaneously. Psych:  Responds to questions appropriately with a normal affect. Dialysis Access: PD cath   Labs: Basic Metabolic Panel: Recent Labs  Lab 05/26/19 1541 05/27/19 0549  NA 139 136  K 3.6 3.7  CL 101 101  CO2 24 23  GLUCOSE 129* 195*  BUN 37* 39*  CREATININE 6.24* 6.79*  CALCIUM 7.3* 7.5*   Liver Function Tests: Recent Labs  Lab 05/26/19 1541  AST 15  ALT 13  ALKPHOS 115  BILITOT 0.5  PROT 6.1*  ALBUMIN 2.5*   Recent Labs  Lab 05/26/19 1541  LIPASE 31   Recent Labs  Lab 05/26/19 1542  AMMONIA 25   CBC: Recent Labs  Lab 05/26/19 1541  WBC 10.5  NEUTROABS 8.8*  HGB 12.5*  HCT 39.2  MCV 102.1*  PLT 189   Cardiac Enzymes: No results for input(s): CKTOTAL, CKMB, CKMBINDEX, TROPONINI in the last 168 hours. CBG: Recent Labs  Lab 05/27/19 0753  GLUCAP 164*   Iron Studies: No results for input(s): IRON, TIBC, TRANSFERRIN, FERRITIN in the last 72  hours. Studies/Results:   Dialysis Orders:  [facility] 5 fills 2.5L Last fill 1.5 Dwell time 1h 48min   Assessment/Plan: 1. Recurrent syncope --Cardiology evaluating. ?orthstatic etiology. Carvedilol has been discontinued  Carotid dopplers/Echo pending. Needs to ambulate  2. ESRD -  Continue CCPD.  3. Hypertension/volume  - Variable BP. Not grossly overloaded on exam. Use all 1.5% tonight and follow  4. Anemia  - Hgb > 12. No ESA needs  5. Metabolic bone disease -  Continue PhosLo binder, Vit D  6. AFib - On Eliquis. Diltiazem changed to short acting  7. Nutrition - Renal diet/prot supp for low albumin  8. COPD - quit smoking last month   Vessie Olmsted Larina Earthly PA-C [place] Pager [phone] 05/27/2019, 3:50 PM

## 2019-05-27 NOTE — Progress Notes (Signed)
Bilateral carotid artery duplex exam completed.  Preliminary results can be found under CV proc under chart review.  05/27/2019 9:56 AM  Shifra Swartzentruber, K., RDMS, RVT

## 2019-05-27 NOTE — Progress Notes (Signed)
  Echocardiogram 2D Echocardiogram has been performed.  [name] 05/27/2019, 2:28 PM

## 2019-05-27 NOTE — Progress Notes (Addendum)
SATURATION QUALIFICATIONS: (This note is used to comply with regulatory documentation for home oxygen)  Patient Saturations on Room Air at Rest =  96 %  Patient Saturations on Room Air while Ambulating =   97 %  Patient Saturations on 0 Liters of oxygen while Ambulating =   97 %  Please briefly explain why patient needs home oxygen:  BP  Prior to ambulation  : 130/80  BP post ambulation  :  118/ 81  BP taken on the Right arm .

## 2019-05-27 NOTE — Progress Notes (Signed)
Progress Note    Aaron [name]  [id number] DOB: 1956-03-10  DOA: 05/26/2019 PCP: Gwenlyn Saran, [place]    Brief Narrative:   Chief complaint: syncope  Medical records reviewed and are as summarized below:  Aaron [name] is an 64 y.o. male with a past medical history of end-stage renal disease on peritoneal dialysis, A. fib, CAD, COPD, HCV presented to the emergency department from his cardiologist office with a chief complaint of syncope.  Evaluated by cardiology in the emergency department who opined is a vagal syncope versus orthostatic hypotension in the setting of bradycardia.  Assessment/Plan:   Principal Problem:   Syncope, vasovagal Active Problems:   Essential hypertension   CAD (coronary artery disease)   ESRD on dialysis (HCC)   Back pain   COPD (chronic obstructive pulmonary disease) (HCC)   Ischemic cardiomyopathy   Atrial fibrillation, chronic (HCC)   Hyperglycemia   Protein-calorie malnutrition, severe   Noncompliance with medication regimen   #1.  Syncope.  Etiology remains unclear.  Evaluated by cardiology who opine orthostatic versus vagovagal.  Orthostatics negative now that his beta-blocker stopped but likely positive prior.  Statics evaluated by cardiology who opine not consistent with orthostatic hypotension but difficult to interpret heart rate in the setting of A. fib.  They also recommended stopping beta-blocker and changing diltiazem to short acting. -Follow echo results -Follow carotid Doppler results -Ambulate in hall assessing respiratory effort and heart rate and blood pressure -Follow further recommendations by cardiology  #2.  A. fib.  Heart rate range 44-77.  EKG shows A. fib at 51 beats a minute.  TSH is within the limits of normal.  CHA2DS2-VASc score 2-3.  Home medications include Eliquis -Diltiazem changed to short acting -Beta-blocker stopped secondary to #1 and bradycardia -Continue Eliquis  3.  Hypertension.   Blood pressures variable.  Home medications include Coreg diltiazem lisinopril hydralazine.  Coreg held as noted above.  Diltiazem changed as noted above. -Continue Cardizem, hydralazine -Monitor closely -See #1  #4.  COPD.  Not on home oxygen.  Some wheezing noted on admission was provided with Solu-Medrol and nebulizers initiated.  Oxygen saturation level greater than 90% at rest on room air -continue nebs -prednisone for now -monitor oxygen saturation level -mobilize  #5.  End-stage renal disease on peritoneal dialysis.  He did not have peritoneal dialysis last night.  Creatinine 6.79.  Potassium level 3.7.  Chart indicates Dr. Justin Mend with nephrology informed and opined given that patient did not have any signs of fluid overload or electrolyte imbalance or acidemia peritoneal dialysis would be held last night and they would check in the morning. -I confirmed with nephrology that patient is on their list for evaluation  #6.  Chronic back pain.  Patient complains of back pain in the bed no worse than usual.  Home meds include gabapentin and Norco. -Continue home meds -Mobilize  #7.  Chronic diastolic heart failure/ischemic cardiomyopathy.  Care everywhere reveals a visit with cardiology at [facility] in December echo done last year reveals EF of 45% with inferior wall motion abnormality.  At that time recommendation was to continue lisinopril Lasix dialysis and beta-blocker.  Abdomen is distended he appears slightly overloaded. -Dialysis per nephrology -Defer further management to cardiology  #8.  Hyperglycemia.  Likely related to steroids.  No history of diabetes.  Serum glucose 195 this morning up from 129 yesterday. -Sliding scale insulin    Family Communication/Anticipated D/C date and plan/Code Status   DVT prophylaxis:  Lovenox ordered. Code Status: Full Code.  Family Communication: patient Disposition Plan: home when ready   Medical Consultants:    [place]  [facility]phrology  .[facility]   Anti-Infectives:    None  Subjective:   Lying in bed complains of back pain.  He states he has chronic back pain and it is no worse than usual no other complaints  Objective:    Vitals:   05/26/19 2043 05/26/19 2045 05/27/19 0430 05/27/19 1207  BP:   132/74 (!) 81/70  Pulse:   69   Resp: 20  18   Temp: 97.7 F (36.5 C)  98.2 F (36.8 C)   TempSrc: Oral  Oral   SpO2:   94%   Weight:  88.3 kg 88.3 kg   Height:  6\' 4"  (1.93 m)      Intake/Output Summary (Last 24 hours) at 05/27/2019 1315 Last data filed at 05/27/2019 0645 Gross per 24 hour  Intake 755 ml  Output 350 ml  Net 405 ml   Filed Weights   05/26/19 1542 05/26/19 2045 05/27/19 0430  Weight: 86.6 kg 88.3 kg 88.3 kg    Exam: Awake alert thin chronically ill-appearing unkept. CV irregularly irregular no murmur gallop or rub trace lower extremity edema Respiratory: Mild increased work of breathing with conversation breath sounds are coarse throughout scattered rhonchi faint end expiratory wheeze Abdomen: Slightly distended soft positive bowel sounds throughout Musculoskeletal: Moves all extremities spontaneously joints without swelling  Data Reviewed:   I have personally reviewed following labs and imaging studies:  Labs: Labs show the following:   Basic Metabolic Panel: Recent Labs  Lab 05/26/19 1541 05/27/19 0549  NA 139 136  K 3.6 3.7  CL 101 101  CO2 24 23  GLUCOSE 129* 195*  BUN 37* 39*  CREATININE 6.24* 6.79*  CALCIUM 7.3* 7.5*   GFR Estimated Creatinine Clearance: 13.8 mL/min (A) (by C-G formula based on SCr of 6.79 mg/dL (H)). Liver Function Tests: Recent Labs  Lab 05/26/19 1541  AST 15  ALT 13  ALKPHOS 115  BILITOT 0.5  PROT 6.1*  ALBUMIN 2.5*   Recent Labs  Lab 05/26/19 1541  LIPASE 31   Recent Labs  Lab 05/26/19 1542  AMMONIA 25   Coagulation profile Recent Labs  Lab 05/26/19 1541  INR 1.0    CBC: Recent Labs  Lab 05/26/19 1541   WBC 10.5  NEUTROABS 8.8*  HGB 12.5*  HCT 39.2  MCV 102.1*  PLT 189   Cardiac Enzymes: No results for input(s): CKTOTAL, CKMB, CKMBINDEX, TROPONINI in the last 168 hours. BNP (last 3 results) No results for input(s): PROBNP in the last 8760 hours. CBG: Recent Labs  Lab 05/27/19 0753  GLUCAP 164*   D-Dimer: No results for input(s): DDIMER in the last 72 hours. Hgb A1c: No results for input(s): HGBA1C in the last 72 hours. Lipid Profile: No results for input(s): CHOL, HDL, LDLCALC, TRIG, CHOLHDL, LDLDIRECT in the last 72 hours. Thyroid function studies: Recent Labs    05/27/19 0549  TSH 0.891   Anemia work up: No results for input(s): VITAMINB12, FOLATE, FERRITIN, TIBC, IRON, RETICCTPCT in the last 72 hours. Sepsis Labs: Recent Labs  Lab 05/26/19 1541 05/26/19 1807  WBC 10.5  --   LATICACIDVEN 1.3 0.8    Microbiology Recent Results (from the past 240 hour(s))  Blood Culture (routine x 2)     Status: None (Preliminary result)   Collection Time: 05/26/19  3:40 PM   Specimen: BLOOD RIGHT FOREARM  Result Value Ref Range Status   Specimen Description BLOOD RIGHT FOREARM  Final   Special Requests   Final    BOTTLES DRAWN AEROBIC AND ANAEROBIC Blood Culture results may not be optimal due to an inadequate volume of blood received in culture bottles   Culture   Final    NO GROWTH < 24 HOURS Performed at [facility] [address], [place], [place] [address]    Report Status PENDING  Incomplete  Blood Culture (routine x 2)     Status: None (Preliminary result)   Collection Time: 05/26/19  3:47 PM   Specimen: BLOOD  Result Value Ref Range Status   Specimen Description BLOOD RIGHT ANTECUBITAL  Final   Special Requests   Final    BOTTLES DRAWN AEROBIC AND ANAEROBIC Blood Culture adequate volume   Culture   Final    NO GROWTH < 24 HOURS Performed at [facility], [place] [address] [place], [place] [address]    Report Status PENDING  Incomplete  MRSA PCR  Screening     Status: None   Collection Time: 05/27/19  1:45 AM   Specimen: Nasopharyngeal  Result Value Ref Range Status   MRSA by PCR NEGATIVE NEGATIVE Final    Comment:        The GeneXpert MRSA Assay (FDA approved for NASAL specimens only), is one component of a comprehensive MRSA colonization surveillance program. It is not intended to diagnose MRSA infection nor to guide or monitor treatment for MRSA infections. Performed at [facility], [place] [address] [place], [place] [address]   SARS CORONAVIRUS 2 (TAT 6-24 HRS) Nasopharyngeal     Status: None   Collection Time: 05/27/19  1:56 AM   Specimen: Nasopharyngeal  Result Value Ref Range Status   SARS Coronavirus 2 NEGATIVE NEGATIVE Final    Comment: (NOTE) SARS-CoV-2 target nucleic acids are NOT DETECTED. The SARS-CoV-2 RNA is generally detectable in upper and lower respiratory specimens during the acute phase of infection. Negative results do not preclude SARS-CoV-2 infection, do not rule out co-infections with other pathogens, and should not be used as the sole basis for treatment or other patient management decisions. Negative results must be combined with clinical observations, patient history, and epidemiological information. The expected result is Negative. Fact Sheet for Patients: SugarRoll.be Fact Sheet for Healthcare Providers: https://www.woods-mathews.com/ This test is not yet approved or cleared by the [place] FDA and  has been authorized for detection and/or diagnosis of SARS-CoV-2 by FDA under an Emergency Use Authorization (EUA). This EUA will remain  in effect (meaning this test can be used) for the duration of the COVID-19 declaration under Section 56 4(b)(1) of the Act, 21 U.S.C. section 360bbb-3(b)(1), unless the authorization is terminated or revoked sooner. Performed at [facility], [place] [address] [place],  [address]      Procedures and diagnostic studies:  DG Chest Port 1 View  Result Date: 05/26/2019 CLINICAL DATA:  Fainting episodes. No reported chest pain or shortness of breath. EXAM: PORTABLE CHEST 1 VIEW COMPARISON:  03/14/2019 FINDINGS: Cardiac silhouette is normal in size. No mediastinal or hilar masses. No evidence of adenopathy. There are prominent bronchovascular markings at the bases with relative lucency in the mid to upper lungs, findings consistent with emphysema. No evidence of pneumonia or pulmonary edema. No pleural effusion or pneumothorax. Skeletal structures are grossly intact. IMPRESSION: 1. No acute cardiopulmonary disease. 2. COPD/emphysema.  Stable appearance from the prior study. Electronically  Signed   By: Lajean Manes M.D.   On: 05/26/2019 16:34   VAS US CAROTID  Result Date: 05/27/2019 Carotid Arterial Duplex Study Indications:       Syncope. Risk Factors:      Prior MI, coronary artery disease. Other Factors:     COPD, A Fib. Comparison Study:  Carotid duplex 08-25-17. Performing Technologist: Baldwin Crown ARDMS, RVT  Examination Guidelines: A complete evaluation includes B-mode imaging, spectral Doppler, color Doppler, and power Doppler as needed of all accessible portions of each vessel. Bilateral testing is considered an integral part of a complete examination. Limited examinations for reoccurring indications may be performed as noted.  Right Carotid Findings: +----------+--------+--------+--------+------------------+--------+           PSV cm/sEDV cm/sStenosisPlaque DescriptionComments +----------+--------+--------+--------+------------------+--------+ CCA Prox  96      26                                         +----------+--------+--------+--------+------------------+--------+ CCA Distal84      17              calcific                   +----------+--------+--------+--------+------------------+--------+ ICA Prox  136     35      40-59%  heterogenous                +----------+--------+--------+--------+------------------+--------+ ICA Distal101     26                                         +----------+--------+--------+--------+------------------+--------+ ECA       138     16                                         +----------+--------+--------+--------+------------------+--------+ +----------+--------+-------+----------------+-------------------+           PSV cm/sEDV cmsDescribe        Arm Pressure (mmHG) +----------+--------+-------+----------------+-------------------+ Subclavian180            Multiphasic, WNL                    +----------+--------+-------+----------------+-------------------+ +---------+--------+--+--------+--+---------+ VertebralPSV cm/s88EDV cm/s36Antegrade +---------+--------+--+--------+--+---------+  Left Carotid Findings: +----------+--------+--------+--------+------------------+------------------+           PSV cm/sEDV cm/sStenosisPlaque DescriptionComments           +----------+--------+--------+--------+------------------+------------------+ CCA Prox  97      29                                                   +----------+--------+--------+--------+------------------+------------------+ CCA Distal88      20              calcific          intimal thickening +----------+--------+--------+--------+------------------+------------------+ ICA Prox  57      12      1-39%   heterogenous                         +----------+--------+--------+--------+------------------+------------------+ ICA Distal76  27                                                   +----------+--------+--------+--------+------------------+------------------+ ECA       110     18                                                   +----------+--------+--------+--------+------------------+------------------+ +----------+--------+--------+----------------+-------------------+           PSV  cm/sEDV cm/sDescribe        Arm Pressure (mmHG) +----------+--------+--------+----------------+-------------------+ [id number]      12      Multiphasic, WNL                    +----------+--------+--------+----------------+-------------------+ +---------+--------+--+--------+-+----------+ VertebralPSV cm/s79EDV cm/s9Retrograde +---------+--------+--+--------+-+----------+   Summary: Right Carotid: Velocities in the right ICA are consistent with a 40-59%                stenosis. Non-hemodynamically significant plaque <50% noted in                the CCA. Left Carotid: Velocities in the left ICA are consistent with a 1-39% stenosis.               Non-hemodynamically significant plaque <50% noted in the CCA. Vertebrals:  Right vertebral artery demonstrates antegrade flow. Left vertebral              artery demonstrates retrograde flow. Subclavians: Normal flow hemodynamics were seen in bilateral subclavian              arteries. *See table(s) above for measurements and observations.  Electronically signed by Monica Martinez MD on 05/27/2019 at 11:05:33 AM.    Final     Medications:   . albuterol  2.5 mg Inhalation Q6H  . apixaban  2.5 mg Oral BID  . calcium acetate  667 mg Oral TID WC  . clopidogrel  75 mg Oral Daily  . diltiazem  60 mg Oral Q6H  . feeding supplement (NEPRO CARB STEADY)  237 mL Oral BID BM  . fluticasone furoate-vilanterol  1 puff Inhalation Daily  . gabapentin  300 mg Oral BID  . hydrALAZINE  50 mg Oral Q8H  . isosorbide mononitrate  60 mg Oral Daily  . pantoprazole  40 mg Oral Daily  . predniSONE  40 mg Oral Q breakfast  . sodium bicarbonate  650 mg Oral TID  . sodium chloride flush  3 mL Intravenous Q12H  . umeclidinium bromide  1 puff Inhalation Daily   Continuous Infusions:   LOS: 0 days   Radene Gunning NP  [facility]   How to contact the [facility] Attending or Consulting provider [place] or covering provider during after hours [place], for this  patient?  1. Check the care team in [facility] and look for a) attending/consulting [facility] provider listed and b) the [facility] team listed 2. Log into [email or web address] and use [place]'s universal password to access. If you do not have the password, please contact the hospital operator. 3. Locate the [facility] provider you are looking for under [facility] and page to a number that you can be directly reached. 4. If you still have  difficulty reaching the provider, please page the [facility] (Director on Call) for the Hospitalists listed on amion for assistance.  05/27/2019, 1:15 PM

## 2019-05-27 NOTE — Progress Notes (Addendum)
Progress Note  Patient Name: Aaron [name] Date of Encounter: 05/27/2019  Primary Cardiologist: Minus Breeding, MD   Subjective   Patient is feeling the same as yesterday. He has had no further pre-syncope/syncope but has also been in the bed. Tele shows Afib with rates 60-70s.  Inpatient Medications    Scheduled Meds: . apixaban  2.5 mg Oral BID  . calcium acetate  667 mg Oral TID WC  . clopidogrel  75 mg Oral Daily  . diltiazem  60 mg Oral Q6H  . feeding supplement (NEPRO CARB STEADY)  237 mL Oral BID BM  . fluticasone furoate-vilanterol  1 puff Inhalation Daily  . gabapentin  300 mg Oral BID  . hydrALAZINE  50 mg Oral Q8H  . isosorbide mononitrate  60 mg Oral Daily  . pantoprazole  40 mg Oral Daily  . predniSONE  40 mg Oral Q breakfast  . sodium bicarbonate  650 mg Oral TID  . sodium chloride flush  3 mL Intravenous Q12H  . umeclidinium bromide  1 puff Inhalation Daily   Continuous Infusions:  PRN Meds: albuterol, fluticasone, HYDROcodone-acetaminophen, metoprolol tartrate   Vital Signs    Vitals:   05/26/19 2015 05/26/19 2043 05/26/19 2045 05/27/19 0430  BP: (!) 158/101   132/74  Pulse: (!) 102   69  Resp: 15 20  18   Temp:  97.7 F (36.5 C)  98.2 F (36.8 C)  TempSrc:  Oral  Oral  SpO2: 100%   94%  Weight:   88.3 kg 88.3 kg  Height:   6\' 4"  (1.93 m)     Intake/Output Summary (Last 24 hours) at 05/27/2019 0818 Last data filed at 05/27/2019 0645 Gross per 24 hour  Intake 755 ml  Output 350 ml  Net 405 ml   Last 3 Weights 05/27/2019 05/26/2019 05/26/2019  Weight (lbs) 194 lb 10.7 oz 194 lb 11.2 oz 191 lb  Weight (kg) 88.3 kg 88.315 kg 86.637 kg      Telemetry    Afib, HR 60-70s, PVCs - Personally Reviewed  ECG    No new - Personally Reviewed  Physical Exam   GEN: No acute distress.   Neck: No JVD Cardiac: RRR, no murmurs, rubs, or gallops.  Respiratory: diffusely diminished. GI: Soft, nontender, non-distended  MS: No edema; No  deformity. Neuro:  Nonfocal  Psych: Normal affect   Labs    High Sensitivity Troponin:  No results for input(s): TROPONINIHS in the last 720 hours.    Chemistry Recent Labs  Lab 05/26/19 1541 05/27/19 0549  NA 139 136  K 3.6 3.7  CL 101 101  CO2 24 23  GLUCOSE 129* 195*  BUN 37* 39*  CREATININE 6.24* 6.79*  CALCIUM 7.3* 7.5*  PROT 6.1*  --   ALBUMIN 2.5*  --   AST 15  --   ALT 13  --   ALKPHOS 115  --   BILITOT 0.5  --   GFRNONAA 9* 8*  GFRAA 10* 9*  ANIONGAP 14 12     Hematology Recent Labs  Lab 05/26/19 1541  WBC 10.5  RBC 3.84*  HGB 12.5*  HCT 39.2  MCV 102.1*  MCH 32.6  MCHC 31.9  RDW 14.3  PLT 189    BNP Recent Labs  Lab 05/26/19 1542  BNP 322.8*     DDimer No results for input(s): DDIMER in the last 168 hours.   Radiology    DG Chest Port 1 View  Result Date: 05/26/2019 CLINICAL  DATA:  Fainting episodes. No reported chest pain or shortness of breath. EXAM: PORTABLE CHEST 1 VIEW COMPARISON:  03/14/2019 FINDINGS: Cardiac silhouette is normal in size. No mediastinal or hilar masses. No evidence of adenopathy. There are prominent bronchovascular markings at the bases with relative lucency in the mid to upper lungs, findings consistent with emphysema. No evidence of pneumonia or pulmonary edema. No pleural effusion or pneumothorax. Skeletal structures are grossly intact. IMPRESSION: 1. No acute cardiopulmonary disease. 2. COPD/emphysema.  Stable appearance from the prior study. Electronically Signed   By: Lajean Manes M.D.   On: 05/26/2019 16:34    Cardiac Studies   Echo results pending   Myoview stress test 2018  Findings consistent with prior myocardial infarction.  This is an intermediate risk study.  The left ventricular ejection fraction is moderately decreased (30-44%).  There was no ST segment deviation noted during stress.   Moderate sized inferior wall infarct from apex to base no ischemia EF estimated at 36% with diffuse  hypokinesis worse in inferior wall  Patient Profile     64 y.o. male with hx of ESRD on PD, AF, CAD, COPD, HCV who is being seen for the evaluation of syncope.   Assessment & Plan    Syncope - Orthostatic vs vasovagal.  Orthostatics currently negative since carvedilol was discontinued, but suspect may have been positive prior to this - orthostatics>> not consistent with orthostatic hypotension, difficult to interpret HR in setting of AF but no drop in BP Lying 150/96 HR 84 Sitting 145/113, HR 43 Standing 175/96, HR 78 - Echo results pending - Coreg was discontinued - Dilt switched to short acting - Would recommend working with a nurse to monitor patient while standing up and walking.   Afib - CHADSVASC 2-3 - continue eliquis  CAD - reported cath in 2002 which showed mild nonobstructive disease - 2018 Myoview stress test negative for ischemia, EF 36% - Patient denies chest pain  Reported systolic Heart Failure - EF 45% 05/2018 at [facility] with reported inferior akinesis - echo this admission results pending  PAD s/p left SFA stent in 2016, stents to the right common iliac/right external iliac/right SFA in 2019.  - Continue atorvastatin 80 mg daily.  - continue plavix  HTN - at home was on coreg, dilt, lisinopril, hydralazine - coreg held as above  For questions or updates, please contact [place] Please consult [email or web address] for contact info under       Signed, Cadence Ninfa Meeker, PA-C  05/27/2019, 8:18 AM    Patient seen and examined.  Agree with above documentation.  On exam, patient is alert and oriented, regular rate, irregular rhythm, no murmurs, lungs with expiratory wheezing, no LE edema or JVD.  Orthostatics negative, but given description consistent with orthostasis, suspect was likely orthostatic when he was on Coreg and diltiazem.  Coreg has been discontinued.  Checking TTE today.  Telemetry shows atrial fibrillation with rates 160s to 80s, PVCs.  Rates well  controlled on diltiazem, but no longer bradycardic with discontinuation of carvedilol.  Donato Heinz, MD

## 2019-05-27 NOTE — Care Management Obs Status (Signed)
[place] NOTIFICATION   Patient Details  Name: Aaron [name] MRN: [id number] Date of Birth: 06/29/1955   Medicare Observation Status Notification Given:  Yes    Dawayne Patricia, RN 05/27/2019, 5:03 PM

## 2019-05-27 NOTE — Progress Notes (Signed)
Initial Nutrition Assessment  DOCUMENTATION CODES:   Not applicable  INTERVENTION:   Continue Nepro Shake po BID, each supplement provides 425 kcal and 19 grams protein  Renal MVI daily  Snacks TID   NUTRITION DIAGNOSIS:   Increased nutrient needs related to chronic illness(ESRD on PD) as evidenced by estimated needs.   GOAL:   Patient will meet greater than or equal to 90% of their needs   MONITOR:   PO intake, Supplement acceptance, Weight trends  REASON FOR ASSESSMENT:   Malnutrition Screening Tool    ASSESSMENT:   Pt with a PMH significant for ESRD on PD, Afib, CAD, Hep C, COPD, MI sent for syncopal episodes. Pt reports having 3 syncopal episodes during the past week.  Pt reports being fearful of cooking in his home due to fear of "blacking out" and falling or causing a kitchen fire. He states he is interested in having help at home. Because of his fear, he reports he primarily eats boiled eggs for breakfast and deli meat sandwiches for lunch and dinner. He states he also snacks throughout the day on crackers and consumes protein bars and Boost protein drinks. Dicussed importance of adequate protein intake. Pt requests sandwiches at night and is agreeable to supplements while admitted. Assisted pt with ordering dinner.   Pt unsure of his EDW. Pt states that he still produces urine and endorses urinating 1x/day.   Pt unsure of his current wt, but reports losing weight over the last year. Pt states he weighed 245 lbs 1 year ago. Based on wts available in chart, pt noted for a 6% wt loss over the last year, which is not significant for timeframe.   Medications reviewed and include: Phoslo, Nepro BID, Imdur, SSI, Deltasone, Sodium Bicarbonate  Labs reviewed: BUN 39 (H), Creatinine 6.79 (H) CBG 164  UOP: 375ml x24 hours I/O: +445ml since admit     NUTRITION - FOCUSED PHYSICAL EXAM:    Most Recent Value  Orbital Region  No depletion  Upper Arm Region  Mild  depletion  Thoracic and Lumbar Region  No depletion  Buccal Region  Mild depletion  Temple Region  Mild depletion  Clavicle Bone Region  Mild depletion  Clavicle and Acromion Bone Region  Mild depletion  Scapular Bone Region  Mild depletion  Dorsal Hand  Mild depletion  Patellar Region  Mild depletion [pt wearing jeans]  Anterior Thigh Region  Mild depletion [pt wearing jeans]  Posterior Calf Region  Mild depletion [pt wearing jeans]  Edema (RD Assessment)  Mild  Hair  Reviewed  Eyes  Reviewed  Mouth  Reviewed  Skin  Reviewed  Nails  Reviewed       Diet Order:   Diet Order            Diet renal with fluid restriction Fluid restriction: 1200 mL Fluid; Room service appropriate? Yes; Fluid consistency: Thin  Diet effective now              EDUCATION NEEDS:   Education needs have been addressed  Skin:  Skin Assessment: Reviewed RN Assessment  Last BM:  PTA  Height:   Ht Readings from Last 1 Encounters:  05/26/19 6\' 4"  (1.93 m)    Weight:   Wt Readings from Last 1 Encounters:  05/27/19 88.3 kg    BMI:  Body mass index is 23.7 kg/m.  Estimated Nutritional Needs:   Kcal:  2200-2400  Protein:  135-150 grams  Fluid:  1072ml + UOP  Aaron Ina, MS, RD, LDN RD pager number and weekend/on-call pager number located in [place].

## 2019-05-28 DIAGNOSIS — R55 Syncope and collapse: Secondary | ICD-10-CM | POA: Diagnosis not present

## 2019-05-28 LAB — URINE CULTURE

## 2019-05-28 LAB — GLUCOSE, CAPILLARY
Glucose-Capillary: 132 mg/dL — ABNORMAL HIGH (ref 70–99)
Glucose-Capillary: 105 mg/dL — ABNORMAL HIGH (ref 70–99)

## 2019-05-28 MED ORDER — ALBUTEROL SULFATE (2.5 MG/3ML) 0.083% IN NEBU
2.5000 mg | INHALATION_SOLUTION | Freq: Three times a day (TID) | RESPIRATORY_TRACT | Status: DC
  Administered 2019-05-28: 2.5 mg via RESPIRATORY_TRACT
  Filled 2019-05-28: qty 3

## 2019-05-28 MED ORDER — HYDRALAZINE HCL 25 MG PO TABS: 25.0000 mg | ORAL_TABLET | Freq: Three times a day (TID) | ORAL | 0 refills | Status: DC

## 2019-05-28 MED ORDER — DILTIAZEM HCL 60 MG PO TABS: 60.0000 mg | ORAL_TABLET | Freq: Four times a day (QID) | ORAL | 0 refills | Status: DC

## 2019-05-28 MED ORDER — APIXABAN 5 MG PO TABS: 5.0000 mg | ORAL_TABLET | Freq: Two times a day (BID) | ORAL | 0 refills | Status: DC

## 2019-05-28 MED ORDER — FLUTICASONE FUROATE-VILANTEROL 200-25 MCG/INH IN AEPB
1.0000 | INHALATION_SPRAY | Freq: Every day | RESPIRATORY_TRACT | Status: DC
  Administered 2019-05-28: 1 via RESPIRATORY_TRACT
  Filled 2019-05-28: qty 28

## 2019-05-28 MED ORDER — SODIUM CHLORIDE 0.9 % IV BOLUS: 1000.0000 mL | Freq: Once | INTRAVENOUS | Status: DC

## 2019-05-28 MED ORDER — PREDNISONE 20 MG PO TABS: ORAL_TABLET | ORAL | 0 refills | Status: DC

## 2019-05-28 NOTE — Progress Notes (Signed)
Progress Note  Patient Name: Aaron [name] Date of Encounter: 05/28/2019  Primary Cardiologist: Minus Breeding, MD   Subjective   No cardiac complaints Getting PD   Inpatient Medications    Scheduled Meds: . albuterol  2.5 mg Inhalation TID  . apixaban  5 mg Oral BID  . calcium acetate  667 mg Oral TID WC  . cholecalciferol  2,000 Units Oral Daily  . clopidogrel  75 mg Oral Daily  . diltiazem  60 mg Oral Q6H  . feeding supplement (NEPRO CARB STEADY)  237 mL Oral BID BM  . [START ON 05/29/2019] fluticasone furoate-vilanterol  1 puff Inhalation Daily  . gabapentin  300 mg Oral BID  . gentamicin cream  1 application Topical Daily  . hydrALAZINE  25 mg Oral Q8H  . insulin aspart  0-6 Units Subcutaneous TID WC  . isosorbide mononitrate  60 mg Oral Daily  . multivitamin  1 tablet Oral QHS  . pantoprazole  40 mg Oral Daily  . predniSONE  40 mg Oral Q breakfast  . sodium bicarbonate  650 mg Oral TID  . sodium chloride flush  3 mL Intravenous Q12H  . umeclidinium bromide  1 puff Inhalation Daily   Continuous Infusions:  PRN Meds: fluticasone, heparin, HYDROcodone-acetaminophen, metoprolol tartrate   Vital Signs    Vitals:   05/27/19 2316 05/28/19 0418 05/28/19 0525 05/28/19 0824  BP: 126/74  114/86   Pulse:   81 64  Resp:   18 18  Temp:   97.8 F (36.6 C)   TempSrc:   Oral   SpO2:   96% 96%  Weight:  93.1 kg    Height:        Intake/Output Summary (Last 24 hours) at 05/28/2019 [id number] Last data filed at 05/28/2019 0400 Gross per 24 hour  Intake -  Output 275 ml  Net -275 ml   Last 3 Weights 05/28/2019 05/27/2019 05/26/2019  Weight (lbs) 205 lb 4 oz 194 lb 10.7 oz 194 lb 11.2 oz  Weight (kg) 93.1 kg 88.3 kg 88.315 kg      Telemetry    Afib, HR 60-70s, PVCs - Personally Reviewed  ECG    No new - Personally Reviewed  Physical Exam   Affect appropriate Chronically ill make looks older than stated age  HEENT: normal Neck supple with no adenopathy JVP  normal no bruits no thyromegaly Lungs clear with no wheezing and good diaphragmatic motion Heart:  S1/S2 no murmur, no rub, gallop or click PMI normal Abdomen: protuberant PD catheter  Distal pulses intact with no bruits No edema Neuro non-focal Skin warm and dry No muscular weakness   Labs    High Sensitivity Troponin:  No results for input(s): TROPONINIHS in the last 720 hours.    Chemistry Recent Labs  Lab 05/26/19 1541 05/27/19 0549  NA 139 136  K 3.6 3.7  CL 101 101  CO2 24 23  GLUCOSE 129* 195*  BUN 37* 39*  CREATININE 6.24* 6.79*  CALCIUM 7.3* 7.5*  PROT 6.1*  --   ALBUMIN 2.5*  --   AST 15  --   ALT 13  --   ALKPHOS 115  --   BILITOT 0.5  --   GFRNONAA 9* 8*  GFRAA 10* 9*  ANIONGAP 14 12     Hematology Recent Labs  Lab 05/26/19 1541  WBC 10.5  RBC 3.84*  HGB 12.5*  HCT 39.2  MCV 102.1*  MCH 32.6  MCHC 31.9  RDW  14.3  PLT 189    BNP Recent Labs  Lab 05/26/19 1542  BNP 322.8*     DDimer No results for input(s): DDIMER in the last 168 hours.   Radiology    DG Chest Port 1 View  Result Date: 05/26/2019 CLINICAL DATA:  Fainting episodes. No reported chest pain or shortness of breath. EXAM: PORTABLE CHEST 1 VIEW COMPARISON:  03/14/2019 FINDINGS: Cardiac silhouette is normal in size. No mediastinal or hilar masses. No evidence of adenopathy. There are prominent bronchovascular markings at the bases with relative lucency in the mid to upper lungs, findings consistent with emphysema. No evidence of pneumonia or pulmonary edema. No pleural effusion or pneumothorax. Skeletal structures are grossly intact. IMPRESSION: 1. No acute cardiopulmonary disease. 2. COPD/emphysema.  Stable appearance from the prior study. Electronically Signed   By: Lajean Manes M.D.   On: 05/26/2019 16:34   ECHOCARDIOGRAM COMPLETE  Result Date: 05/27/2019    ECHOCARDIOGRAM REPORT   Patient Name:   Aaron [name] Date of Exam: 05/27/2019 Medical Rec #:  [id number]       Height:       76.0 in Accession #:    [id number]     Weight:       194.7 lb Date of Birth:  Sep 24, 1955      BSA:          2.19 m Patient Age:    64 years       BP:           82/70 mmHg Patient Gender: M              HR:           76 bpm. Exam Location:  Inpatient Procedure: 2D Echo, Cardiac Doppler and Color Doppler Indications:    R55 Syncope  History:        Patient has prior history of Echocardiogram examinations, most                 recent 01/12/2018. Previous Myocardial Infarction and CAD, COPD,                 Arrythmias:Atrial Fibrillation; Risk Factors:Hypertension and                 Dyslipidemia. ESRD.  Sonographer:    Jonelle Sidle Dance Referring Phys: [id number] [place]  1. Left ventricular ejection fraction, by estimation, is 50 to 55%. The left ventricle has low normal function. The left ventricle has no regional wall motion abnormalities. There is mildly increased left ventricular hypertrophy. Left ventricular diastolic parameters are indeterminate.  2. Right ventricular systolic function is normal. The right ventricular size is normal.  3. Left atrial size was moderately dilated.  4. Right atrial size was mildly dilated.  5. The mitral valve is normal in structure and function. Trivial mitral valve regurgitation. No evidence of mitral stenosis.  6. The aortic valve is tricuspid. Aortic valve regurgitation is not visualized. Mild aortic valve sclerosis is present, with no evidence of aortic valve stenosis.  7. The inferior vena cava is normal in size with greater than 50% respiratory variability, suggesting right atrial pressure of 3 mmHg. FINDINGS  Left Ventricle: Left ventricular ejection fraction, by estimation, is 50 to 55%. The left ventricle has low normal function. The left ventricle has no regional wall motion abnormalities. The left ventricular internal cavity size was normal in size. There is mildly increased left ventricular hypertrophy. Left ventricular diastolic parameters are  indeterminate. Right  Ventricle: The right ventricular size is normal. No increase in right ventricular wall thickness. Right ventricular systolic function is normal. Left Atrium: Left atrial size was moderately dilated. Right Atrium: Right atrial size was mildly dilated. Pericardium: There is no evidence of pericardial effusion. Mitral Valve: The mitral valve is normal in structure and function. There is mild thickening of the mitral valve leaflet(s). Normal mobility of the mitral valve leaflets. Trivial mitral valve regurgitation. No evidence of mitral valve stenosis. Tricuspid Valve: The tricuspid valve is normal in structure. Tricuspid valve regurgitation is mild . No evidence of tricuspid stenosis. Aortic Valve: The aortic valve is tricuspid. Aortic valve regurgitation is not visualized. Mild aortic valve sclerosis is present, with no evidence of aortic valve stenosis. Pulmonic Valve: The pulmonic valve was normal in structure. Pulmonic valve regurgitation is not visualized. No evidence of pulmonic stenosis. Aorta: The aortic root is normal in size and structure. Venous: The inferior vena cava is normal in size with greater than 50% respiratory variability, suggesting right atrial pressure of 3 mmHg. IAS/Shunts: No atrial level shunt detected by color flow Doppler.  LEFT VENTRICLE PLAX 2D LVIDd:         5.70 cm LVIDs:         4.50 cm LV PW:         1.30 cm LV IVS:        1.20 cm LVOT diam:     2.50 cm LV SV:         73.30 ml LV SV Index:   31.08 LVOT Area:     4.91 cm  RIGHT VENTRICLE             IVC RV Basal diam:  3.50 cm     IVC diam: 1.60 cm RV Mid diam:    2.20 cm RV S prime:     15.10 cm/s TAPSE (M-mode): 2.6 cm LEFT ATRIUM              Index       RIGHT ATRIUM           Index LA diam:        5.30 cm  2.42 cm/m  RA Area:     24.20 cm LA Vol (A2C):   106.0 ml 48.40 ml/m RA Volume:   79.90 ml  36.49 ml/m LA Vol (A4C):   69.2 ml  31.60 ml/m LA Biplane Vol: 85.3 ml  38.95 ml/m  AORTIC VALVE LVOT Vmax:    90.00 cm/s LVOT Vmean:  58.433 cm/s LVOT VTI:    0.149 m  AORTA Ao Root diam: 4.20 cm Ao Asc diam:  3.40 cm MITRAL VALVE MV Area (PHT): 3.91 cm    SHUNTS MV Decel Time: 194 msec    Systemic VTI:  0.15 m MV E velocity: 84.60 cm/s  Systemic Diam: 2.50 cm MV A velocity: 36.50 cm/s MV E/A ratio:  2.32 Jenkins Rouge MD Electronically signed by Jenkins Rouge MD Signature Date/Time: 05/27/2019/2:51:20 PM    Final    VAS US CAROTID  Result Date: 05/27/2019 Carotid Arterial Duplex Study Indications:       Syncope. Risk Factors:      Prior MI, coronary artery disease. Other Factors:     COPD, A Fib. Comparison Study:  Carotid duplex 08-25-17. Performing Technologist: Baldwin Crown ARDMS, RVT  Examination Guidelines: A complete evaluation includes B-mode imaging, spectral Doppler, color Doppler, and power Doppler as needed of all accessible portions of each vessel. Bilateral testing is considered an integral  part of a complete examination. Limited examinations for reoccurring indications may be performed as noted.  Right Carotid Findings: +----------+--------+--------+--------+------------------+--------+           PSV cm/sEDV cm/sStenosisPlaque DescriptionComments +----------+--------+--------+--------+------------------+--------+ CCA Prox  96      26                                         +----------+--------+--------+--------+------------------+--------+ CCA Distal84      17              calcific                   +----------+--------+--------+--------+------------------+--------+ ICA Prox  136     35      40-59%  heterogenous               +----------+--------+--------+--------+------------------+--------+ ICA Distal101     26                                         +----------+--------+--------+--------+------------------+--------+ ECA       138     16                                         +----------+--------+--------+--------+------------------+--------+  +----------+--------+-------+----------------+-------------------+           PSV cm/sEDV cmsDescribe        Arm Pressure (mmHG) +----------+--------+-------+----------------+-------------------+ Subclavian180            Multiphasic, WNL                    +----------+--------+-------+----------------+-------------------+ +---------+--------+--+--------+--+---------+ VertebralPSV cm/s88EDV cm/s36Antegrade +---------+--------+--+--------+--+---------+  Left Carotid Findings: +----------+--------+--------+--------+------------------+------------------+           PSV cm/sEDV cm/sStenosisPlaque DescriptionComments           +----------+--------+--------+--------+------------------+------------------+ CCA Prox  97      29                                                   +----------+--------+--------+--------+------------------+------------------+ CCA Distal88      20              calcific          intimal thickening +----------+--------+--------+--------+------------------+------------------+ ICA Prox  57      12      1-39%   heterogenous                         +----------+--------+--------+--------+------------------+------------------+ ICA Distal76      27                                                   +----------+--------+--------+--------+------------------+------------------+ ECA       110     18                                                   +----------+--------+--------+--------+------------------+------------------+ +----------+--------+--------+----------------+-------------------+  PSV cm/sEDV cm/sDescribe        Arm Pressure (mmHG) +----------+--------+--------+----------------+-------------------+ [id number]      12      Multiphasic, WNL                    +----------+--------+--------+----------------+-------------------+ +---------+--------+--+--------+-+----------+ VertebralPSV cm/s79EDV cm/s9Retrograde  +---------+--------+--+--------+-+----------+   Summary: Right Carotid: Velocities in the right ICA are consistent with a 40-59%                stenosis. Non-hemodynamically significant plaque <50% noted in                the CCA. Left Carotid: Velocities in the left ICA are consistent with a 1-39% stenosis.               Non-hemodynamically significant plaque <50% noted in the CCA. Vertebrals:  Right vertebral artery demonstrates antegrade flow. Left vertebral              artery demonstrates retrograde flow. Subclavians: Normal flow hemodynamics were seen in bilateral subclavian              arteries. *See table(s) above for measurements and observations.  Electronically signed by Monica Martinez MD on 05/27/2019 at 11:05:33 AM.    Final     Cardiac Studies   Echo:  05/27/19  EF 50-55%    Myoview stress test 2018  Findings consistent with prior myocardial infarction.  This is an intermediate risk study.  The left ventricular ejection fraction is moderately decreased (30-44%).  There was no ST segment deviation noted during stress.   Moderate sized inferior wall infarct from apex to base no ischemia EF estimated at 36% with diffuse hypokinesis worse in inferior wall  Patient Profile     64 y.o. male with hx of ESRD on PD, AF, CAD, COPD, HCV who is being seen for the evaluation of syncope.   Assessment & Plan    Syncope - not orthostatic beta blocker d/c and cardizem changed to short acting  Echo with EF low normal 50-55% No further cardiac w/u planned   Afib- CHADVASC 3 on eliquis AV nodal drugs adjusted see above   CAD- no obstructive disease cath 2002 non ishemic myovue 2018 no chest pain observe   CHF:  Historical EF 50-55% TTE 05/27/19 unable to get diastolic parameters in afib Does not appear volume overloaded on exam BNP only 322 2 days ago Fluid management per renal   PAD- s/p left SFA stent in 2016, stents to the right common iliac/right external iliac/right SFA in 2019.  Statin / Plavix   HTN- Well controlled.  Continue current medications and low sodium Dash type diet.    For questions or updates, please contact [place] Please consult [email or web address] for contact info under   Signed, Jenkins Rouge, MD  05/28/2019, 8:37 AM

## 2019-05-28 NOTE — Progress Notes (Addendum)
[facility] Progress Note   Subjective:  Seen in room. Completing PD- no net UF overnight. Feels ok in bed but reports getting "swimmy-headed" when sitting up.   Objective Vitals:   05/27/19 2316 05/28/19 0418 05/28/19 0525 05/28/19 0824  BP: 126/74  114/86   Pulse:   81 64  Resp:   18 18  Temp:   97.8 F (36.6 C)   TempSrc:   Oral   SpO2:   96% 96%  Weight:  93.1 kg    Height:        Weight change: 6.463 kg  93.1 kg  Additional Objective Labs: Basic Metabolic Panel: Recent Labs  Lab 05/26/19 1541 05/27/19 0549  NA 139 136  K 3.6 3.7  CL 101 101  CO2 24 23  GLUCOSE 129* 195*  BUN 37* 39*  CREATININE 6.24* 6.79*  CALCIUM 7.3* 7.5*   CBC: Recent Labs  Lab 05/26/19 1541  WBC 10.5  NEUTROABS 8.8*  HGB 12.5*  HCT 39.2  MCV 102.1*  PLT 189   Blood Culture    Component Value Date/Time   SDES IN/OUT CATH URINE 05/27/2019 0524   SPECREQUEST NONE 05/27/2019 0524   CULT  05/27/2019 0524    CULTURE REINCUBATED FOR BETTER GROWTH Performed at [facility] [address] [place], [place] [address]    REPTSTATUS PENDING 05/27/2019 [id number]     Physical Exam General: Alert, nad  Heart: RRR  Lungs: Clear, Abdomen: soft non-tender; mildly distended w PD fluid Extremities: trace pedal edema bilaterally  Dialysis Access: PD cath   Medications:  . albuterol  2.5 mg Inhalation TID  . apixaban  5 mg Oral BID  . calcium acetate  667 mg Oral TID WC  . cholecalciferol  2,000 Units Oral Daily  . clopidogrel  75 mg Oral Daily  . diltiazem  60 mg Oral Q6H  . feeding supplement (NEPRO CARB STEADY)  237 mL Oral BID BM  . [START ON 05/29/2019] fluticasone furoate-vilanterol  1 puff Inhalation Daily  . gabapentin  300 mg Oral BID  . gentamicin cream  1 application Topical Daily  . hydrALAZINE  25 mg Oral Q8H  . insulin aspart  0-6 Units Subcutaneous TID WC  . isosorbide mononitrate  60 mg Oral Daily  . multivitamin  1 tablet Oral QHS  .  pantoprazole  40 mg Oral Daily  . predniSONE  40 mg Oral Q breakfast  . sodium bicarbonate  650 mg Oral TID  . sodium chloride flush  3 mL Intravenous Q12H  . umeclidinium bromide  1 puff Inhalation Daily    Dialysis Orders:  [facility] 5 total fills -- 4 fills 2.5L Last fill 1.5L  Dwell time 1h 34min   Assessment/Plan: 1. Recurrent syncope -- ?orthstatic etiology. Carvedilol has been discontinued  Cards eval Echo EF 50-55% No further cardiac w/u planned. Concern for hypovolemia this am--PD RN reports no UF overnight. Will give 1L IVF today and follow. Spoke w/ PMD, is now up walking halls and repeat ortho's are negative, so saline bolus canceled.  2. ESRD -  Continue CCPD. At home using all 2.5% with last fill icodextrin. OK for dc from renal standpoint.  3. Hypertension/volume  - BP on the low side. Not grossly overloaded on exam. Giving 1L fluid bolus today. Use all 1.5% tonight and follow  4. Anemia  - Hgb > 12. No ESA needs  5. Metabolic bone disease -  Continue PhosLo binder, Vit D  6. AFib - On Eliquis. Diltiazem changed to short acting  7. Nutrition - Renal diet/prot supp for low albumin  8. COPD - quit smoking last month   Lynnda Child PA-C [facility] Pager [phone] 05/28/2019,9:15 AM  LOS: 0 days   Pt seen, examined and agree w assess/plan as above with additions as indicated.  [facility] 05/28/2019, 10:52 AM

## 2019-05-28 NOTE — Discharge Summary (Signed)
Physician Discharge Summary  Aaron [name] [id number] DOB: Apr 06, 1956 DOA: 05/26/2019  PCP: [facility] [place] date: 05/26/2019 Discharge date: 05/28/2019  Admitted From: Home Discharge disposition: Home   Recommendations for Outpatient Follow-Up:   1. Continue peritoneal dialysis at home 2. Beta-blocker stopped 3. Change positions slowly 4. Outpatient PT 5. Outpatient PFTs   Discharge Diagnosis:   Principal Problem:   Syncope, vasovagal Active Problems:   Essential hypertension   COPD (chronic obstructive pulmonary disease) (HCC)   CAD (coronary artery disease)   Ischemic cardiomyopathy   Protein-calorie malnutrition, severe   ESRD on dialysis [facility])   Atrial fibrillation, chronic (HCC)   Back pain   Noncompliance with medication regimen   Hyperglycemia    Discharge Condition: Improved.  Diet recommendation: Low sodium, heart healthy.  Renal  Wound care: None.  Code status: Full.   History of Present Illness:   Aaron [name] is a 64 y.o. male with medical history significant of ESRD on PD, Afib, CAD, Hep C, COPD, MI sent from cardiologist office for syncopal episodes. Patient reports having 3 syncopal episodes during the past week.  First episode happened earlier this week, at home with a friend, he remembered he had blurry vision and lightheadedness before this happened, he was sitting in the sofa and then slighted down to the floor for few minutes.  Woke up later confused, but denied any body aching, no tongue biting no loss control of urine or bowel movement.  Today, he was in cardiologist office today where he had another episode with prodromes like lightheadedness, " feeling hot on my stomach", blurry vision, nurses and staff at the cardiologist office were able to place them in a wheelchair. He is currently on eliquis and Cardizem for his history of Afib. Denies any chest pain, shortness of breath, recent sickness. He quit  smoking about 1 month ago.   He uses albuterol short of breath for about a year as well as Breo, all his breathing medications were prescribed by his PCP, he was never seen by a pulmonologist and he never had a lung function test. ED Course: Patient was found actively wheezing, blood pressure stable, no bradycardia.    Hospital Course by Problem:   Syncope  -- not orthostatic  -beta blocker d/c and cardizem changed to short acting   due to bradycardia Echo with EF low normal 50-55% No further cardiac w/u planned  per Dr. Roosvelt Harps  Afib- CHADVASC 3 on eliquis (will increase Eliquis dose to 5 per pharmacy recommendations as he does not meet the requirement for the 2.5 dosage) AV nodal drugs adjusted see above   CAD- no obstructive disease cath 2002 non ishemic myovue 2018 no chest pain observe   CHF:  Historical EF 50-55% TTE 05/27/19 unable to get diastolic parameters in afib Does not appear volume overloaded on exam BNP only 322 2 days ago Fluid management per renal   PAD- s/p left SFA stent in 2016, stents to the right common iliac/right external iliac/right SFA in 2019. Statin / Plavix   HTN- Well controlled.  Continue current medications and low sodium Dash type diet.    COPD: Acute exacerbation Not on oxygen Prednisone burst  Medical Consultants:   Nephrology Cardiology   Discharge Exam:   Vitals:   05/28/19 0525 05/28/19 0824  BP: 114/86   Pulse: 81 64  Resp: 18 18  Temp: 97.8 F (36.6 C)   SpO2: 96% 96%   Vitals:  05/27/19 2316 05/28/19 0418 05/28/19 0525 05/28/19 0824  BP: 126/74  114/86   Pulse:   81 64  Resp:   18 18  Temp:   97.8 F (36.6 C)   TempSrc:   Oral   SpO2:   96% 96%  Weight:  93.1 kg    Height:        General exam: Appears calm and comfortable.    The results of significant diagnostics from this hospitalization (including imaging, microbiology, ancillary and laboratory) are listed below for reference.     Procedures and  Diagnostic Studies:   DG Chest Port 1 View  Result Date: 05/26/2019 CLINICAL DATA:  Fainting episodes. No reported chest pain or shortness of breath. EXAM: PORTABLE CHEST 1 VIEW COMPARISON:  03/14/2019 FINDINGS: Cardiac silhouette is normal in size. No mediastinal or hilar masses. No evidence of adenopathy. There are prominent bronchovascular markings at the bases with relative lucency in the mid to upper lungs, findings consistent with emphysema. No evidence of pneumonia or pulmonary edema. No pleural effusion or pneumothorax. Skeletal structures are grossly intact. IMPRESSION: 1. No acute cardiopulmonary disease. 2. COPD/emphysema.  Stable appearance from the prior study. Electronically Signed   By: Lajean Manes M.D.   On: 05/26/2019 16:34   ECHOCARDIOGRAM COMPLETE  Result Date: 05/27/2019    ECHOCARDIOGRAM REPORT   Patient Name:   REIK INGERSON Date of Exam: 05/27/2019 Medical Rec #:  [id number]      Height:       76.0 in Accession #:    [id number]     Weight:       194.7 lb Date of Birth:  August 08, 1955      BSA:          2.19 m Patient Age:    61 years       BP:           82/70 mmHg Patient Gender: M              HR:           76 bpm. Exam Location:  Inpatient Procedure: 2D Echo, Cardiac Doppler and Color Doppler Indications:    R55 Syncope  History:        Patient has prior history of Echocardiogram examinations, most                 recent 01/12/2018. Previous Myocardial Infarction and CAD, COPD,                 Arrythmias:Atrial Fibrillation; Risk Factors:Hypertension and                 Dyslipidemia. ESRD.  Sonographer:    Jonelle Sidle Dance Referring Phys: [id number] [place]  1. Left ventricular ejection fraction, by estimation, is 50 to 55%. The left ventricle has low normal function. The left ventricle has no regional wall motion abnormalities. There is mildly increased left ventricular hypertrophy. Left ventricular diastolic parameters are indeterminate.  2. Right ventricular systolic  function is normal. The right ventricular size is normal.  3. Left atrial size was moderately dilated.  4. Right atrial size was mildly dilated.  5. The mitral valve is normal in structure and function. Trivial mitral valve regurgitation. No evidence of mitral stenosis.  6. The aortic valve is tricuspid. Aortic valve regurgitation is not visualized. Mild aortic valve sclerosis is present, with no evidence of aortic valve stenosis.  7. The inferior vena cava is normal in size with greater than 50%  respiratory variability, suggesting right atrial pressure of 3 mmHg. FINDINGS  Left Ventricle: Left ventricular ejection fraction, by estimation, is 50 to 55%. The left ventricle has low normal function. The left ventricle has no regional wall motion abnormalities. The left ventricular internal cavity size was normal in size. There is mildly increased left ventricular hypertrophy. Left ventricular diastolic parameters are indeterminate. Right Ventricle: The right ventricular size is normal. No increase in right ventricular wall thickness. Right ventricular systolic function is normal. Left Atrium: Left atrial size was moderately dilated. Right Atrium: Right atrial size was mildly dilated. Pericardium: There is no evidence of pericardial effusion. Mitral Valve: The mitral valve is normal in structure and function. There is mild thickening of the mitral valve leaflet(s). Normal mobility of the mitral valve leaflets. Trivial mitral valve regurgitation. No evidence of mitral valve stenosis. Tricuspid Valve: The tricuspid valve is normal in structure. Tricuspid valve regurgitation is mild . No evidence of tricuspid stenosis. Aortic Valve: The aortic valve is tricuspid. Aortic valve regurgitation is not visualized. Mild aortic valve sclerosis is present, with no evidence of aortic valve stenosis. Pulmonic Valve: The pulmonic valve was normal in structure. Pulmonic valve regurgitation is not visualized. No evidence of pulmonic  stenosis. Aorta: The aortic root is normal in size and structure. Venous: The inferior vena cava is normal in size with greater than 50% respiratory variability, suggesting right atrial pressure of 3 mmHg. IAS/Shunts: No atrial level shunt detected by color flow Doppler.  LEFT VENTRICLE PLAX 2D LVIDd:         5.70 cm LVIDs:         4.50 cm LV PW:         1.30 cm LV IVS:        1.20 cm LVOT diam:     2.50 cm LV SV:         73.30 ml LV SV Index:   31.08 LVOT Area:     4.91 cm  RIGHT VENTRICLE             IVC RV Basal diam:  3.50 cm     IVC diam: 1.60 cm RV Mid diam:    2.20 cm RV S prime:     15.10 cm/s TAPSE (M-mode): 2.6 cm LEFT ATRIUM              Index       RIGHT ATRIUM           Index LA diam:        5.30 cm  2.42 cm/m  RA Area:     24.20 cm LA Vol (A2C):   106.0 ml 48.40 ml/m RA Volume:   79.90 ml  36.49 ml/m LA Vol (A4C):   69.2 ml  31.60 ml/m LA Biplane Vol: 85.3 ml  38.95 ml/m  AORTIC VALVE LVOT Vmax:   90.00 cm/s LVOT Vmean:  58.433 cm/s LVOT VTI:    0.149 m  AORTA Ao Root diam: 4.20 cm Ao Asc diam:  3.40 cm MITRAL VALVE MV Area (PHT): 3.91 cm    SHUNTS MV Decel Time: 194 msec    Systemic VTI:  0.15 m MV E velocity: 84.60 cm/s  Systemic Diam: 2.50 cm MV A velocity: 36.50 cm/s MV E/A ratio:  2.32 Jenkins Rouge MD Electronically signed by Jenkins Rouge MD Signature Date/Time: 05/27/2019/2:51:20 PM    Final    VAS US CAROTID  Result Date: 05/27/2019 Carotid Arterial Duplex Study Indications:       Syncope. Risk  Factors:      Prior MI, coronary artery disease. Other Factors:     COPD, A Fib. Comparison Study:  Carotid duplex 08-25-17. Performing Technologist: Baldwin Crown ARDMS, RVT  Examination Guidelines: A complete evaluation includes B-mode imaging, spectral Doppler, color Doppler, and power Doppler as needed of all accessible portions of each vessel. Bilateral testing is considered an integral part of a complete examination. Limited examinations for reoccurring indications may be performed as  noted.  Right Carotid Findings: +----------+--------+--------+--------+------------------+--------+           PSV cm/sEDV cm/sStenosisPlaque DescriptionComments +----------+--------+--------+--------+------------------+--------+ CCA Prox  96      26                                         +----------+--------+--------+--------+------------------+--------+ CCA Distal84      17              calcific                   +----------+--------+--------+--------+------------------+--------+ ICA Prox  136     35      40-59%  heterogenous               +----------+--------+--------+--------+------------------+--------+ ICA Distal101     26                                         +----------+--------+--------+--------+------------------+--------+ ECA       138     16                                         +----------+--------+--------+--------+------------------+--------+ +----------+--------+-------+----------------+-------------------+           PSV cm/sEDV cmsDescribe        Arm Pressure (mmHG) +----------+--------+-------+----------------+-------------------+ Subclavian180            Multiphasic, WNL                    +----------+--------+-------+----------------+-------------------+ +---------+--------+--+--------+--+---------+ VertebralPSV cm/s88EDV cm/s36Antegrade +---------+--------+--+--------+--+---------+  Left Carotid Findings: +----------+--------+--------+--------+------------------+------------------+           PSV cm/sEDV cm/sStenosisPlaque DescriptionComments           +----------+--------+--------+--------+------------------+------------------+ CCA Prox  97      29                                                   +----------+--------+--------+--------+------------------+------------------+ CCA Distal88      20              calcific          intimal thickening  +----------+--------+--------+--------+------------------+------------------+ ICA Prox  57      12      1-39%   heterogenous                         +----------+--------+--------+--------+------------------+------------------+ ICA Distal76      27                                                   +----------+--------+--------+--------+------------------+------------------+  ECA       110     18                                                   +----------+--------+--------+--------+------------------+------------------+ +----------+--------+--------+----------------+-------------------+           PSV cm/sEDV cm/sDescribe        Arm Pressure (mmHG) +----------+--------+--------+----------------+-------------------+ [id number]      12      Multiphasic, WNL                    +----------+--------+--------+----------------+-------------------+ +---------+--------+--+--------+-+----------+ VertebralPSV cm/s79EDV cm/s9Retrograde +---------+--------+--+--------+-+----------+   Summary: Right Carotid: Velocities in the right ICA are consistent with a 40-59%                stenosis. Non-hemodynamically significant plaque <50% noted in                the CCA. Left Carotid: Velocities in the left ICA are consistent with a 1-39% stenosis.               Non-hemodynamically significant plaque <50% noted in the CCA. Vertebrals:  Right vertebral artery demonstrates antegrade flow. Left vertebral              artery demonstrates retrograde flow. Subclavians: Normal flow hemodynamics were seen in bilateral subclavian              arteries. *See table(s) above for measurements and observations.  Electronically signed by Monica Martinez MD on 05/27/2019 at 11:05:33 AM.    Final      Labs:   Basic Metabolic Panel: Recent Labs  Lab 05/26/19 1541 05/27/19 0549  NA 139 136  K 3.6 3.7  CL 101 101  CO2 24 23  GLUCOSE 129* 195*  BUN 37* 39*  CREATININE 6.24* 6.79*  CALCIUM 7.3* 7.5*     GFR Estimated Creatinine Clearance: 13.8 mL/min (A) (by C-G formula based on SCr of 6.79 mg/dL (H)). Liver Function Tests: Recent Labs  Lab 05/26/19 1541  AST 15  ALT 13  ALKPHOS 115  BILITOT 0.5  PROT 6.1*  ALBUMIN 2.5*   Recent Labs  Lab 05/26/19 1541  LIPASE 31   Recent Labs  Lab 05/26/19 1542  AMMONIA 25   Coagulation profile Recent Labs  Lab 05/26/19 1541  INR 1.0    CBC: Recent Labs  Lab 05/26/19 1541  WBC 10.5  NEUTROABS 8.8*  HGB 12.5*  HCT 39.2  MCV 102.1*  PLT 189   Cardiac Enzymes: No results for input(s): CKTOTAL, CKMB, CKMBINDEX, TROPONINI in the last 168 hours. BNP: Invalid input(s): POCBNP CBG: Recent Labs  Lab 05/27/19 0753 05/27/19 1720 05/27/19 2222 05/28/19 0722  GLUCAP 164* 154* 198* 132*   D-Dimer No results for input(s): DDIMER in the last 72 hours. Hgb A1c Recent Labs    05/27/19 1520  HGBA1C 5.9*   Lipid Profile No results for input(s): CHOL, HDL, LDLCALC, TRIG, CHOLHDL, LDLDIRECT in the last 72 hours. Thyroid function studies Recent Labs    05/27/19 0549  TSH 0.891   Anemia work up No results for input(s): VITAMINB12, FOLATE, FERRITIN, TIBC, IRON, RETICCTPCT in the last 72 hours. Microbiology Recent Results (from the past 240 hour(s))  Blood Culture (routine x 2)     Status: None (Preliminary result)   Collection Time: 05/26/19  3:40 PM  Specimen: BLOOD RIGHT FOREARM  Result Value Ref Range Status   Specimen Description BLOOD RIGHT FOREARM  Final   Special Requests   Final    BOTTLES DRAWN AEROBIC AND ANAEROBIC Blood Culture results may not be optimal due to an inadequate volume of blood received in culture bottles   Culture   Final    NO GROWTH < 24 HOURS Performed at [facility], [address], [place], [place] [address]    Report Status PENDING  Incomplete  Blood Culture (routine x 2)     Status: None (Preliminary result)   Collection Time: 05/26/19  3:47 PM   Specimen: BLOOD  Result  Value Ref Range Status   Specimen Description BLOOD RIGHT ANTECUBITAL  Final   Special Requests   Final    BOTTLES DRAWN AEROBIC AND ANAEROBIC Blood Culture adequate volume   Culture   Final    NO GROWTH < 24 HOURS Performed at [facility], [place] [address], [place], [place] [address]    Report Status PENDING  Incomplete  MRSA PCR Screening     Status: None   Collection Time: 05/27/19  1:45 AM   Specimen: Nasopharyngeal  Result Value Ref Range Status   MRSA by PCR NEGATIVE NEGATIVE Final    Comment:        The GeneXpert MRSA Assay (FDA approved for NASAL specimens only), is one component of a comprehensive MRSA colonization surveillance program. It is not intended to diagnose MRSA infection nor to guide or monitor treatment for MRSA infections. Performed at [facility], [place] [address], [place], [place] [address]   SARS CORONAVIRUS 2 (TAT 6-24 HRS) Nasopharyngeal     Status: None   Collection Time: 05/27/19  1:56 AM   Specimen: Nasopharyngeal  Result Value Ref Range Status   SARS Coronavirus 2 NEGATIVE NEGATIVE Final    Comment: (NOTE) SARS-CoV-2 target nucleic acids are NOT DETECTED. The SARS-CoV-2 RNA is generally detectable in upper and lower respiratory specimens during the acute phase of infection. Negative results do not preclude SARS-CoV-2 infection, do not rule out co-infections with other pathogens, and should not be used as the sole basis for treatment or other patient management decisions. Negative results must be combined with clinical observations, patient history, and epidemiological information. The expected result is Negative. Fact Sheet for Patients: SugarRoll.be Fact Sheet for Healthcare Providers: https://www.woods-mathews.com/ This test is not yet approved or cleared by the [place] FDA and  has been authorized for detection and/or diagnosis of SARS-CoV-2 by FDA under an Emergency Use  Authorization (EUA). This EUA will remain  in effect (meaning this test can be used) for the duration of the COVID-19 declaration under Section 56 4(b)(1) of the Act, 21 U.S.C. section 360bbb-3(b)(1), unless the authorization is terminated or revoked sooner. Performed at [facility], [place] [address] [place], [place] [address]   Urine culture     Status: Abnormal   Collection Time: 05/27/19  5:24 AM   Specimen: In/Out Cath Urine  Result Value Ref Range Status   Specimen Description IN/OUT CATH URINE  Final   Special Requests   Final    NONE Performed at [facility], [place] [address] [place], [place] [address]    Culture MULTIPLE SPECIES PRESENT, SUGGEST RECOLLECTION (A)  Final   Report Status 05/28/2019 FINAL  Final     Discharge Instructions:   Discharge Instructions    Discharge instructions   Complete by: As directed  Renal/low Na diet Flutter valve   Increase activity slowly   Complete by: As directed      Allergies as of 05/28/2019   No Known Allergies     Medication List    STOP taking these medications   cefTRIAXone 2 g in sodium chloride 0.9 % 100 mL   diltiazem 360 MG 24 hr capsule Commonly known as: CARDIZEM CD   furosemide 40 MG tablet Commonly known as: LASIX   HYDROcodone-acetaminophen 5-325 MG tablet Commonly known as: NORCO/VICODIN   lisinopril 20 MG tablet Commonly known as: ZESTRIL   metoprolol tartrate 100 MG tablet Commonly known as: LOPRESSOR   nitroGLYCERIN 0.4 MG SL tablet Commonly known as: NITROSTAT   vancomycin 1-5 GM/200ML-% Soln Commonly known as: VANCOCIN   warfarin 5 MG tablet Commonly known as: Coumadin     TAKE these medications   albuterol 108 (90 Base) MCG/ACT inhaler Commonly known as: VENTOLIN HFA Inhale 2 puffs into the lungs every 6 (six) hours as needed for wheezing or shortness of breath.   albuterol (2.5 MG/3ML) 0.083% nebulizer solution Commonly known as: PROVENTIL Inhale 3 mLs into the  lungs 3 (three) times daily as needed for shortness of breath or wheezing.   apixaban 5 MG Tabs tablet Commonly known as: ELIQUIS Take 1 tablet (5 mg total) by mouth 2 (two) times daily. What changed:   medication strength  how much to take   atorvastatin 80 MG tablet Commonly known as: LIPITOR Take 80 mg by mouth daily.   Breo Ellipta 200-25 MCG/INH Aepb Generic drug: fluticasone furoate-vilanterol Inhale 1 puff into the lungs daily as needed (for respiratory issues.).   calcium acetate 667 MG capsule Commonly known as: PHOSLO Take 667 mg by mouth 3 (three) times daily with meals.   carvedilol 25 MG tablet Commonly known as: COREG Take 25 mg by mouth 2 (two) times daily.   Chantix 0.5 MG tablet Generic drug: varenicline Take 0.5 mg by mouth daily.   clopidogrel 75 MG tablet Commonly known as: PLAVIX Take 75 mg by mouth daily.   diltiazem 60 MG tablet Commonly known as: CARDIZEM Take 1 tablet (60 mg total) by mouth every 6 (six) hours.   gabapentin 300 MG capsule Commonly known as: NEURONTIN Take 300 mg by mouth 2 (two) times daily.   hydrALAZINE 25 MG tablet Commonly known as: APRESOLINE Take 1 tablet (25 mg total) by mouth every 8 (eight) hours. What changed:   medication strength  how much to take  when to take this   isosorbide mononitrate 60 MG 24 hr tablet Commonly known as: IMDUR Take 60 mg by mouth daily.   oxyCODONE-acetaminophen 10-325 MG tablet Commonly known as: PERCOCET Take 1 tablet by mouth 5 (five) times daily as needed for pain.   pantoprazole 40 MG tablet Commonly known as: PROTONIX Take 1 tablet (40 mg total) by mouth 2 (two) times daily. What changed: when to take this   predniSONE 20 MG tablet Commonly known as: DELTASONE 40 mg x 3 more days with breakfast Start taking on: May 29, 2019   sodium bicarbonate 650 MG tablet Take 1 tablet (650 mg total) by mouth daily. What changed: when to take this   Vitamin D3 50 MCG  (2000 UT) Tabs Take 2,000 Units by mouth daily.         Time coordinating discharge: 25 minutes  Signed:  Geradine Girt DO  [facility] 05/28/2019, 10:15 AM

## 2019-05-28 NOTE — Evaluation (Signed)
Physical Therapy Evaluation Patient Details Name: Aaron [name] MRN: [id number] DOB: 1955/11/28 Today's Date: 05/28/2019   History of Present Illness  64 y.o. male with a past medical history of end-stage renal disease on peritoneal dialysis, A. fib, CAD, COPD, HCV presented to the emergency department from his cardiologist office with a chief complaint of syncope.  Evaluated by cardiology in the emergency department for vagal syncope versus orthostatic hypotension in the setting of bradycardia  Clinical Impression  Pt presents to PT at baseline level of functioning. Pt with gait deviations including slowed gait speed and increased trunk flexion due to chronic back pain. Pt mobilizes at a modI level with use of RW currently, denying and pre-syncopal symptoms. Pt BP stable during change of positions. Pt encouraged to take time between changing positions upon return home. Pt requires no further acute PT services. PT recommends outpatient PT consult for management of chronic low back pain. Acute PT signing off.    Follow Up Recommendations Outpatient PT(for chronic low back pain)    Equipment Recommendations  None recommended by PT(pt owns necessary DME)    Recommendations for Other Services       Precautions / Restrictions Precautions Precautions: None Restrictions Weight Bearing Restrictions: No      Mobility  Bed Mobility Overal bed mobility: Independent                Transfers Overall transfer level: Modified independent Equipment used: Rolling walker (2 wheeled)                Ambulation/Gait Ambulation/Gait assistance: Modified independent (Device/Increase time) Gait Distance (Feet): 120 Feet Assistive device: Rolling walker (2 wheeled) Gait Pattern/deviations: Trunk flexed;Step-through pattern Gait velocity: functional Gait velocity interpretation: 1.31 - 2.62 ft/sec, indicative of limited community ambulator General Gait Details: pt with shortened step  through gait with increase in trunk flexion, bearing weight through BUEs for support (pt reports this is baseline 2/2 chronic back pain)  Stairs            Wheelchair Mobility    Modified Rankin (Stroke Patients Only)       Balance Overall balance assessment: Modified Independent                                           Pertinent Vitals/Pain Pain Assessment: Faces Faces Pain Scale: Hurts even more Pain Location: back Pain Descriptors / Indicators: Aching Pain Intervention(s): Limited activity within patient's tolerance    Home Living Family/patient expects to be discharged to:: Private residence Living Arrangements: Alone Available Help at Discharge: Family(dtr PRN at night) Type of Home: Apartment Home Access: Level entry     Home Layout: One level Home Equipment: Cane - single point;Grab bars - tub/shower;Grab bars - toilet;Bedside commode;Shower seat;Wheelchair - [employer] - 4 wheels;Walker - 2 wheels;Walker - standard      Prior Function Level of Independence: Independent with assistive device(s)         Comments: SPC vs RW     Hand Dominance   Dominant Hand: Right    Extremity/Trunk Assessment   Upper Extremity Assessment Upper Extremity Assessment: Overall WFL for tasks assessed    Lower Extremity Assessment Lower Extremity Assessment: Overall WFL for tasks assessed    Cervical / Trunk Assessment Cervical / Trunk Assessment: Kyphotic  Communication   Communication: No difficulties  Cognition Arousal/Alertness: Awake/alert Behavior During Therapy: WFL for tasks  assessed/performed Overall Cognitive Status: Within Functional Limits for tasks assessed                                        General Comments General comments (skin integrity, edema, etc.): VSS on RA    Exercises     Assessment/Plan    PT Assessment Patent does not need any further PT services  PT Problem List         PT  Treatment Interventions      PT Goals (Current goals can be found in the Care Plan section)       Frequency     Barriers to discharge        Co-evaluation               AM-PAC PT "6 Clicks" Mobility  Outcome Measure Help needed turning from your back to your side while in a flat bed without using bedrails?: None Help needed moving from lying on your back to sitting on the side of a flat bed without using bedrails?: None Help needed moving to and from a bed to a chair (including a wheelchair)?: None Help needed standing up from a chair using your arms (e.g., wheelchair or bedside chair)?: None Help needed to walk in hospital room?: None Help needed climbing 3-5 steps with a railing? : None 6 Click Score: 24    End of Session   Activity Tolerance: Patient tolerated treatment well Patient left: in chair;with call bell/phone within reach;with nursing/sitter in room Nurse Communication: Mobility status      Time: 0922-0948 PT Time Calculation (min) (ACUTE ONLY): 26 min   Charges:   PT Evaluation $PT Eval Moderate Complexity: [place], PT, DPT Acute Rehabilitation Pager: [phone]   Zenaida Niece 05/28/2019, 12:37 PM

## 2019-05-31 ENCOUNTER — Ambulatory Visit (HOSPITAL_COMMUNITY): Payer: 59 | Admitting: Physical Therapy

## 2019-05-31 LAB — CULTURE, BLOOD (ROUTINE X 2)
Culture: NO GROWTH
Culture: NO GROWTH
Special Requests: ADEQUATE

## 2019-06-01 ENCOUNTER — Ambulatory Visit (HOSPITAL_COMMUNITY): Payer: 59 | Attending: Internal Medicine | Admitting: Physical Therapy

## 2019-06-30 IMAGING — CT CT FEMUR *R* W/O CM
3 of 6 series · 10 of 33 positions shown, 11 images · non-contrast
Comparison: None.

CLINICAL DATA: Scrotal and right leg swelling. Recent right CFA
endarterectomy and stenting 10 days ago.

EXAM:
CT OF THE LOWER RIGHT EXTREMITY WITHOUT CONTRAST
TECHNIQUE: Multidetector CT imaging of the right lower extremity was performed
according to the standard protocol.

[Series 8: right femur · axial · 0.98mm/px · z∈[+504,+912]mm · 4 of 308 slices shown, 5 images (1 of 3)]
[im 52/308  soft-tissue]
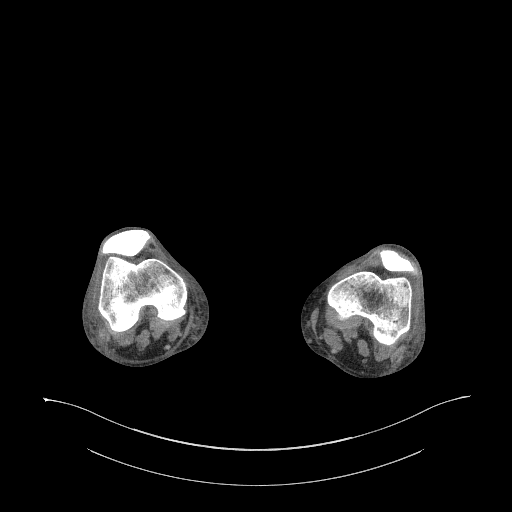
[im 52/308  bone]
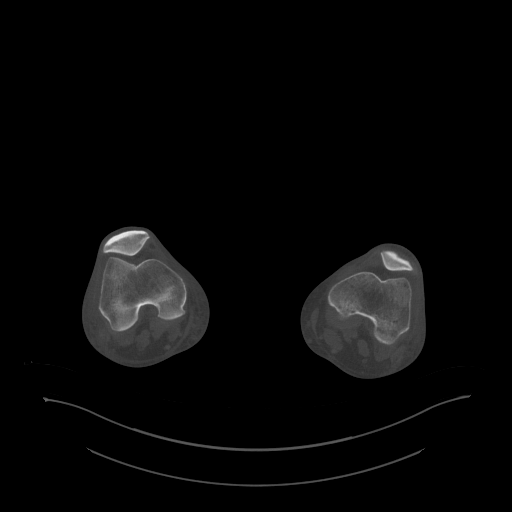
[im 103/308  bone]
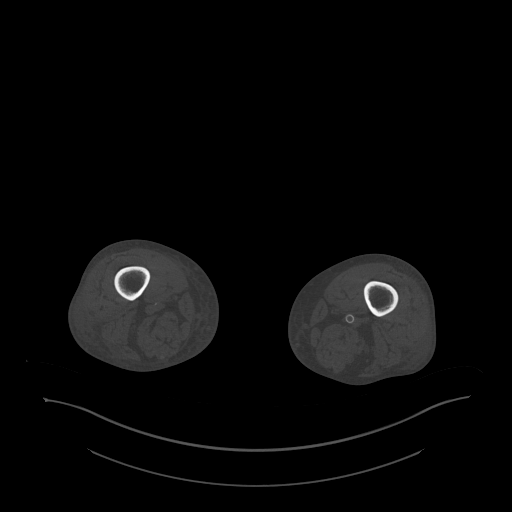
[im 205/308  bone]
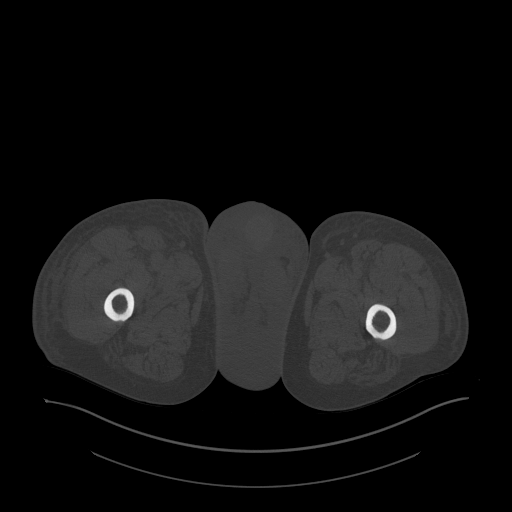
[im 256/308  bone]
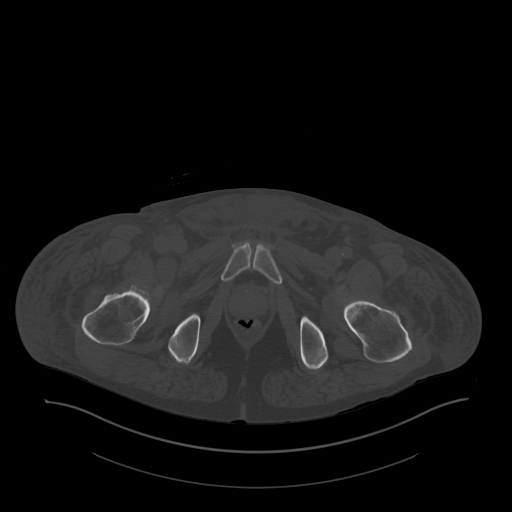

[Series 10: right femur · coronal · 0.52mm/px · 1 of 134 slices shown (2 of 3)]
[im 67/134  bone]
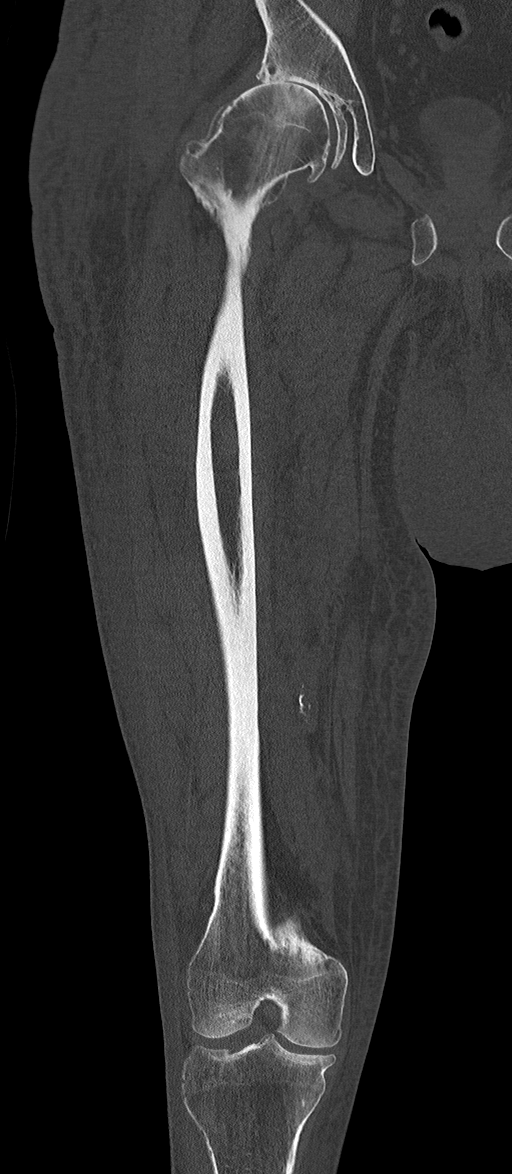

[Series 14: right femur · sagittal · 0.53mm/px · 5 of 134 slices shown (3 of 3)]
[im 23/134  bone]
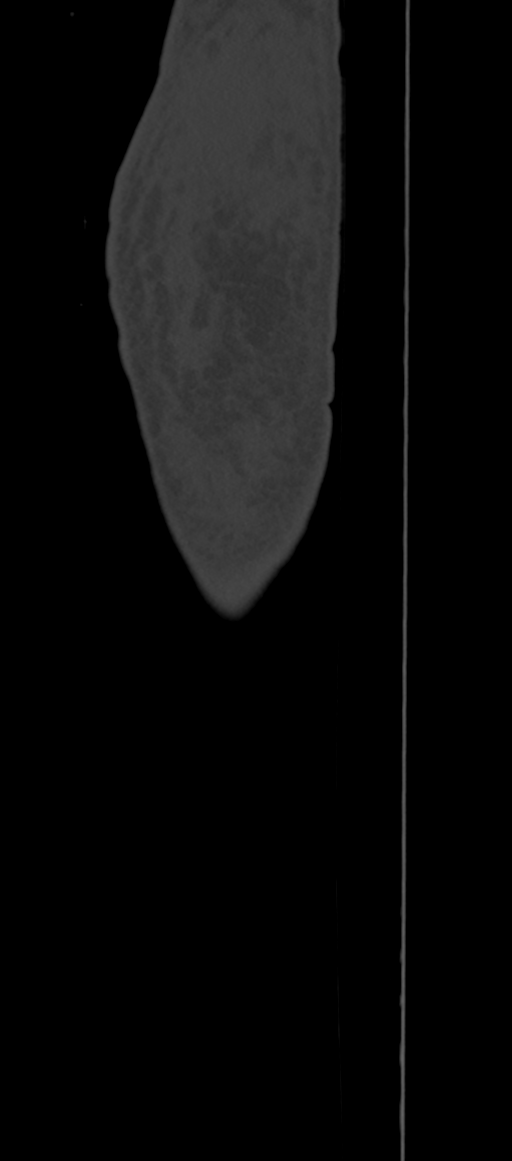
[im 45/134  bone]
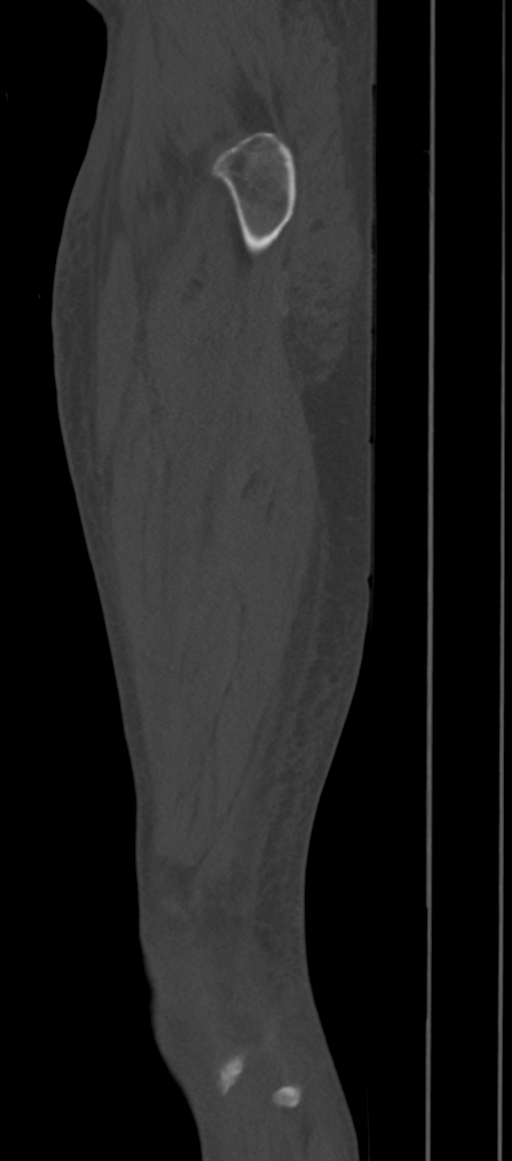
[im 67/134  bone]
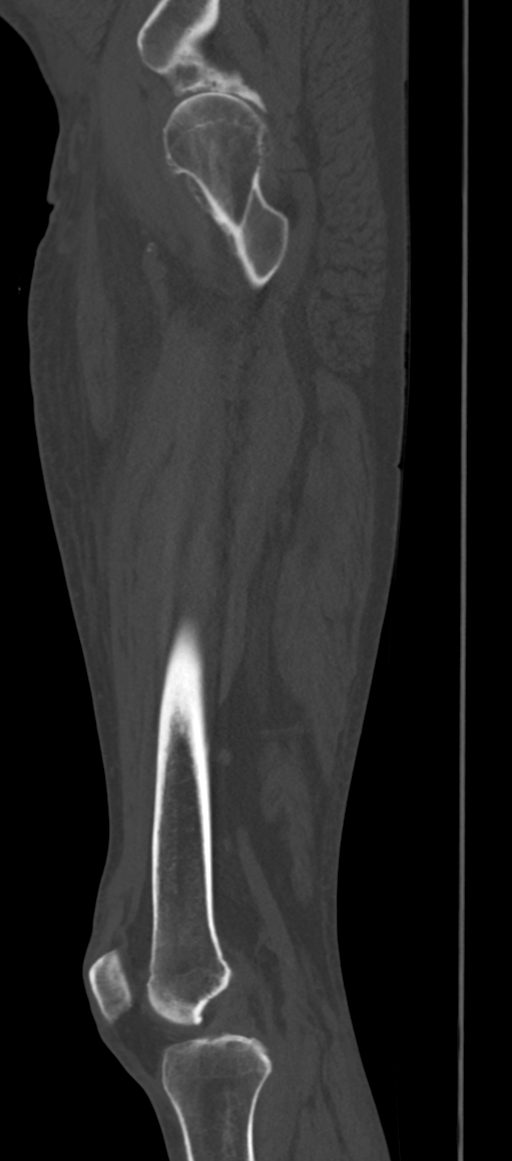
[im 89/134  bone]
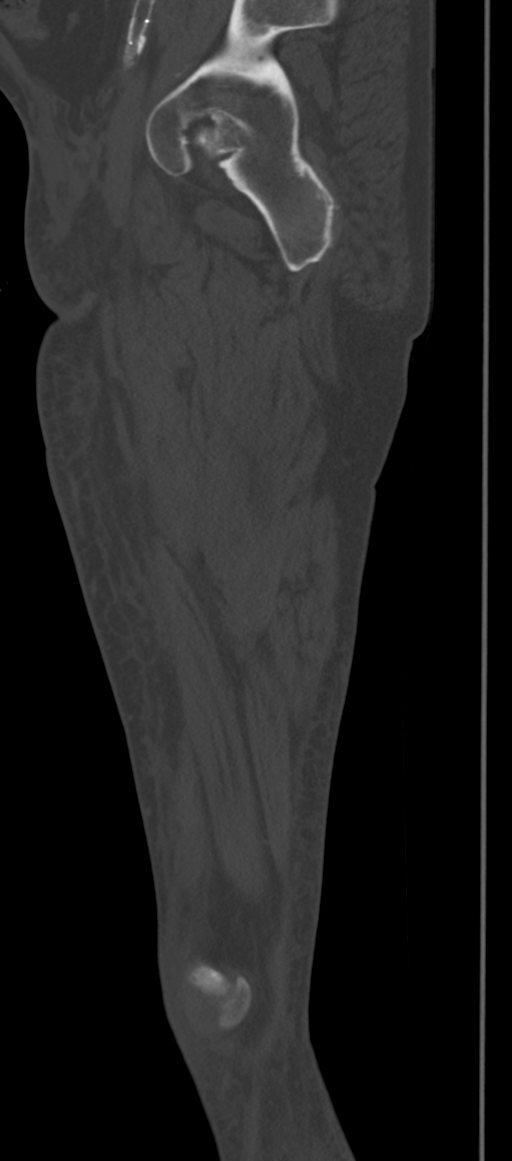
[im 111/134  bone]
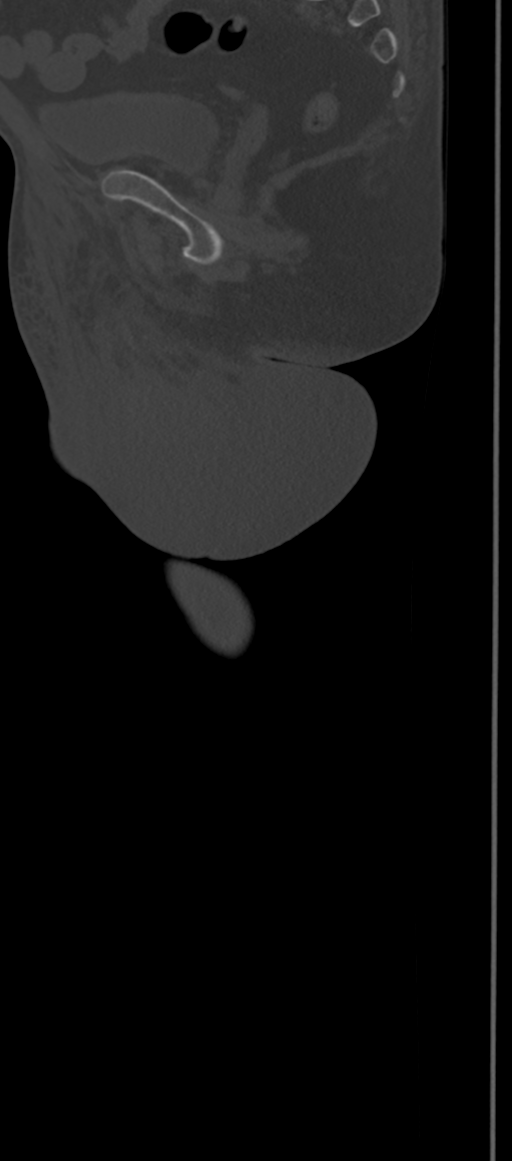

[10 of 33 positions shown; findings below may reference images not displayed]

FINDINGS: Bones/Joint/Cartilage

No acute fracture or dislocation. Severe osteoarthritis of the right
hip joint.

Ligaments

Suboptimally assessed by CT.

Muscles and Tendons

Unremarkable.

Soft tissues

Right external iliac, right proximal SFA, and distal left SFA
stents. Atherosclerotic vascular calcifications. Postsurgical
changes in the right groin with small right inguinal fluid
collection. Diffuse anasarca. Moderate right and mild left thigh
circumferential soft tissue swelling. Severe scrotal and penile
edema.
IMPRESSION: 1. Postsurgical changes in the right groin with small right inguinal
simple appearing fluid collection, favoring seroma.
2. Diffuse anasarca.  Severe scrotal edema.
3. Severe right hip osteoarthritis.  No acute osseous abnormality.

## 2019-08-22 ENCOUNTER — Encounter (HOSPITAL_COMMUNITY): Payer: Self-pay | Admitting: Emergency Medicine

## 2019-08-22 ENCOUNTER — Other Ambulatory Visit: Payer: Self-pay

## 2019-08-22 ENCOUNTER — Inpatient Hospital Stay (HOSPITAL_COMMUNITY)
Admission: EM | Admit: 2019-08-22 | Discharge: 2019-09-11 | DRG: 166 | Disposition: A | Discharge status: Home Health | Payer: 59 | Attending: Student | Admitting: Student

## 2019-08-22 ENCOUNTER — Emergency Department (HOSPITAL_COMMUNITY): Payer: 59

## 2019-08-22 DIAGNOSIS — Z515 Encounter for palliative care: Secondary | ICD-10-CM | POA: Diagnosis not present

## 2019-08-22 DIAGNOSIS — R5381 Other malaise: Secondary | ICD-10-CM | POA: Diagnosis not present

## 2019-08-22 DIAGNOSIS — T8132XA Disruption of internal operation (surgical) wound, not elsewhere classified, initial encounter: Secondary | ICD-10-CM | POA: Diagnosis not present

## 2019-08-22 DIAGNOSIS — Z7189 Other specified counseling: Secondary | ICD-10-CM

## 2019-08-22 DIAGNOSIS — Z7902 Long term (current) use of antithrombotics/antiplatelets: Secondary | ICD-10-CM

## 2019-08-22 DIAGNOSIS — E785 Hyperlipidemia, unspecified: Secondary | ICD-10-CM | POA: Diagnosis present

## 2019-08-22 DIAGNOSIS — F17210 Nicotine dependence, cigarettes, uncomplicated: Secondary | ICD-10-CM | POA: Diagnosis present

## 2019-08-22 DIAGNOSIS — N2581 Secondary hyperparathyroidism of renal origin: Secondary | ICD-10-CM | POA: Diagnosis present

## 2019-08-22 DIAGNOSIS — I509 Heart failure, unspecified: Secondary | ICD-10-CM

## 2019-08-22 DIAGNOSIS — I5033 Acute on chronic diastolic (congestive) heart failure: Secondary | ICD-10-CM | POA: Diagnosis not present

## 2019-08-22 DIAGNOSIS — K9171 Accidental puncture and laceration of a digestive system organ or structure during a digestive system procedure: Secondary | ICD-10-CM | POA: Diagnosis not present

## 2019-08-22 DIAGNOSIS — E876 Hypokalemia: Secondary | ICD-10-CM | POA: Diagnosis not present

## 2019-08-22 DIAGNOSIS — T82898A Other specified complication of vascular prosthetic devices, implants and grafts, initial encounter: Secondary | ICD-10-CM

## 2019-08-22 DIAGNOSIS — E871 Hypo-osmolality and hyponatremia: Secondary | ICD-10-CM | POA: Diagnosis not present

## 2019-08-22 DIAGNOSIS — M170 Bilateral primary osteoarthritis of knee: Secondary | ICD-10-CM | POA: Diagnosis present

## 2019-08-22 DIAGNOSIS — Z7901 Long term (current) use of anticoagulants: Secondary | ICD-10-CM

## 2019-08-22 DIAGNOSIS — Z811 Family history of alcohol abuse and dependence: Secondary | ICD-10-CM

## 2019-08-22 DIAGNOSIS — Z9119 Patient's noncompliance with other medical treatment and regimen: Secondary | ICD-10-CM

## 2019-08-22 DIAGNOSIS — K56609 Unspecified intestinal obstruction, unspecified as to partial versus complete obstruction: Secondary | ICD-10-CM

## 2019-08-22 DIAGNOSIS — M549 Dorsalgia, unspecified: Secondary | ICD-10-CM | POA: Diagnosis present

## 2019-08-22 DIAGNOSIS — K562 Volvulus: Secondary | ICD-10-CM | POA: Diagnosis not present

## 2019-08-22 DIAGNOSIS — S90112A Contusion of left great toe without damage to nail, initial encounter: Secondary | ICD-10-CM

## 2019-08-22 DIAGNOSIS — J441 Chronic obstructive pulmonary disease with (acute) exacerbation: Secondary | ICD-10-CM | POA: Diagnosis present

## 2019-08-22 DIAGNOSIS — I10 Essential (primary) hypertension: Secondary | ICD-10-CM | POA: Diagnosis present

## 2019-08-22 DIAGNOSIS — D72829 Elevated white blood cell count, unspecified: Secondary | ICD-10-CM | POA: Diagnosis not present

## 2019-08-22 DIAGNOSIS — K279 Peptic ulcer, site unspecified, unspecified as acute or chronic, without hemorrhage or perforation: Secondary | ICD-10-CM | POA: Diagnosis present

## 2019-08-22 DIAGNOSIS — I480 Paroxysmal atrial fibrillation: Secondary | ICD-10-CM | POA: Diagnosis present

## 2019-08-22 DIAGNOSIS — Z8041 Family history of malignant neoplasm of ovary: Secondary | ICD-10-CM

## 2019-08-22 DIAGNOSIS — R0602 Shortness of breath: Secondary | ICD-10-CM | POA: Diagnosis present

## 2019-08-22 DIAGNOSIS — E440 Moderate protein-calorie malnutrition: Secondary | ICD-10-CM | POA: Diagnosis present

## 2019-08-22 DIAGNOSIS — I151 Hypertension secondary to other renal disorders: Secondary | ICD-10-CM | POA: Diagnosis present

## 2019-08-22 DIAGNOSIS — Z66 Do not resuscitate: Secondary | ICD-10-CM | POA: Diagnosis not present

## 2019-08-22 DIAGNOSIS — G8929 Other chronic pain: Secondary | ICD-10-CM | POA: Diagnosis present

## 2019-08-22 DIAGNOSIS — N186 End stage renal disease: Secondary | ICD-10-CM | POA: Diagnosis present

## 2019-08-22 DIAGNOSIS — Z992 Dependence on renal dialysis: Secondary | ICD-10-CM

## 2019-08-22 DIAGNOSIS — I482 Chronic atrial fibrillation, unspecified: Secondary | ICD-10-CM | POA: Diagnosis present

## 2019-08-22 DIAGNOSIS — Z6820 Body mass index (BMI) 20.0-20.9, adult: Secondary | ICD-10-CM

## 2019-08-22 DIAGNOSIS — E162 Hypoglycemia, unspecified: Secondary | ICD-10-CM | POA: Diagnosis not present

## 2019-08-22 DIAGNOSIS — K5651 Intestinal adhesions [bands], with partial obstruction: Secondary | ICD-10-CM | POA: Diagnosis not present

## 2019-08-22 DIAGNOSIS — L03311 Cellulitis of abdominal wall: Secondary | ICD-10-CM | POA: Diagnosis not present

## 2019-08-22 DIAGNOSIS — Z7951 Long term (current) use of inhaled steroids: Secondary | ICD-10-CM

## 2019-08-22 DIAGNOSIS — Z7952 Long term (current) use of systemic steroids: Secondary | ICD-10-CM

## 2019-08-22 DIAGNOSIS — Z981 Arthrodesis status: Secondary | ICD-10-CM

## 2019-08-22 DIAGNOSIS — I255 Ischemic cardiomyopathy: Secondary | ICD-10-CM | POA: Diagnosis present

## 2019-08-22 DIAGNOSIS — M19012 Primary osteoarthritis, left shoulder: Secondary | ICD-10-CM | POA: Diagnosis present

## 2019-08-22 DIAGNOSIS — T380X5A Adverse effect of glucocorticoids and synthetic analogues, initial encounter: Secondary | ICD-10-CM | POA: Diagnosis not present

## 2019-08-22 DIAGNOSIS — I252 Old myocardial infarction: Secondary | ICD-10-CM

## 2019-08-22 DIAGNOSIS — M19011 Primary osteoarthritis, right shoulder: Secondary | ICD-10-CM | POA: Diagnosis present

## 2019-08-22 DIAGNOSIS — Y658 Other specified misadventures during surgical and medical care: Secondary | ICD-10-CM | POA: Diagnosis not present

## 2019-08-22 DIAGNOSIS — Z8249 Family history of ischemic heart disease and other diseases of the circulatory system: Secondary | ICD-10-CM

## 2019-08-22 DIAGNOSIS — I4891 Unspecified atrial fibrillation: Secondary | ICD-10-CM | POA: Diagnosis present

## 2019-08-22 DIAGNOSIS — M898X9 Other specified disorders of bone, unspecified site: Secondary | ICD-10-CM | POA: Diagnosis present

## 2019-08-22 DIAGNOSIS — R0600 Dyspnea, unspecified: Secondary | ICD-10-CM | POA: Diagnosis not present

## 2019-08-22 DIAGNOSIS — N189 Chronic kidney disease, unspecified: Secondary | ICD-10-CM | POA: Diagnosis not present

## 2019-08-22 DIAGNOSIS — Q430 Meckel's diverticulum (displaced) (hypertrophic): Secondary | ICD-10-CM

## 2019-08-22 DIAGNOSIS — Z8051 Family history of malignant neoplasm of kidney: Secondary | ICD-10-CM

## 2019-08-22 DIAGNOSIS — J449 Chronic obstructive pulmonary disease, unspecified: Secondary | ICD-10-CM

## 2019-08-22 DIAGNOSIS — Z20822 Contact with and (suspected) exposure to covid-19: Secondary | ICD-10-CM | POA: Diagnosis present

## 2019-08-22 DIAGNOSIS — K567 Ileus, unspecified: Secondary | ICD-10-CM | POA: Diagnosis not present

## 2019-08-22 DIAGNOSIS — J9690 Respiratory failure, unspecified, unspecified whether with hypoxia or hypercapnia: Secondary | ICD-10-CM

## 2019-08-22 DIAGNOSIS — D631 Anemia in chronic kidney disease: Secondary | ICD-10-CM | POA: Diagnosis present

## 2019-08-22 DIAGNOSIS — K565 Intestinal adhesions [bands], unspecified as to partial versus complete obstruction: Secondary | ICD-10-CM

## 2019-08-22 DIAGNOSIS — Q613 Polycystic kidney, unspecified: Secondary | ICD-10-CM

## 2019-08-22 DIAGNOSIS — Z8619 Personal history of other infectious and parasitic diseases: Secondary | ICD-10-CM

## 2019-08-22 DIAGNOSIS — K219 Gastro-esophageal reflux disease without esophagitis: Secondary | ICD-10-CM | POA: Diagnosis present

## 2019-08-22 DIAGNOSIS — I2510 Atherosclerotic heart disease of native coronary artery without angina pectoris: Secondary | ICD-10-CM | POA: Diagnosis present

## 2019-08-22 DIAGNOSIS — Z800 Family history of malignant neoplasm of digestive organs: Secondary | ICD-10-CM

## 2019-08-22 DIAGNOSIS — R059 Cough, unspecified: Secondary | ICD-10-CM

## 2019-08-22 DIAGNOSIS — M479 Spondylosis, unspecified: Secondary | ICD-10-CM | POA: Diagnosis present

## 2019-08-22 DIAGNOSIS — G8918 Other acute postprocedural pain: Secondary | ICD-10-CM | POA: Diagnosis not present

## 2019-08-22 DIAGNOSIS — R001 Bradycardia, unspecified: Secondary | ICD-10-CM | POA: Diagnosis not present

## 2019-08-22 LAB — BASIC METABOLIC PANEL
Anion gap: 15 (ref 5–15)
BUN: 58 mg/dL — ABNORMAL HIGH (ref 8–23)
CO2: 21 mmol/L — ABNORMAL LOW (ref 22–32)
Calcium: 7.5 mg/dL — ABNORMAL LOW (ref 8.9–10.3)
Chloride: 100 mmol/L (ref 98–111)
Creatinine, Ser: 6.79 mg/dL — ABNORMAL HIGH (ref 0.61–1.24)
GFR calc Af Amer: 9 mL/min — ABNORMAL LOW (ref >60)
GFR calc non Af Amer: 8 mL/min — ABNORMAL LOW (ref >60)
Glucose, Bld: 98 mg/dL (ref 70–99)
Potassium: 3.8 mmol/L (ref 3.5–5.1)
Sodium: 136 mmol/L (ref 135–145)

## 2019-08-22 LAB — CBC
HCT: 37.1 % — ABNORMAL LOW (ref 39.0–52.0)
Hemoglobin: 12 g/dL — ABNORMAL LOW (ref 13.0–17.0)
MCH: 32.3 pg (ref 26.0–34.0)
MCHC: 32.3 g/dL (ref 30.0–36.0)
MCV: 99.7 fL (ref 80.0–100.0)
Platelets: 210 10*3/uL (ref 150–400)
RBC: 3.72 MIL/uL — ABNORMAL LOW (ref 4.22–5.81)
RDW: 14.6 % (ref 11.5–15.5)
WBC: 10.8 10*3/uL — ABNORMAL HIGH (ref 4.0–10.5)
nRBC: 0 % (ref 0.0–0.2)

## 2019-08-22 LAB — TROPONIN I (HIGH SENSITIVITY)
Troponin I (High Sensitivity): 68 ng/L — ABNORMAL HIGH (ref <18)
Troponin I (High Sensitivity): 67 ng/L — ABNORMAL HIGH (ref <18)

## 2019-08-22 LAB — MAGNESIUM: Magnesium: 1.7 mg/dL (ref 1.7–2.4)

## 2019-08-22 LAB — BRAIN NATRIURETIC PEPTIDE: B Natriuretic Peptide: 1578 pg/mL — ABNORMAL HIGH (ref 0.0–100.0)

## 2019-08-22 LAB — SARS CORONAVIRUS 2 BY RT PCR (HOSPITAL ORDER, PERFORMED IN ~~LOC~~ HOSPITAL LAB): SARS Coronavirus 2: NEGATIVE

## 2019-08-22 MED ORDER — GUAIFENESIN ER 600 MG PO TB12
600.0000 mg | ORAL_TABLET | Freq: Two times a day (BID) | ORAL | Status: DC
  Administered 2019-08-22 – 2019-08-28 (×13): 600 mg via ORAL
  Filled 2019-08-22 (×13): qty 1

## 2019-08-22 MED ORDER — ALBUTEROL SULFATE (2.5 MG/3ML) 0.083% IN NEBU
2.5000 mg | INHALATION_SOLUTION | RESPIRATORY_TRACT | Status: DC | PRN
  Administered 2019-08-22 – 2019-08-30 (×2): 2.5 mg via RESPIRATORY_TRACT
  Filled 2019-08-22 (×2): qty 3

## 2019-08-22 MED ORDER — SODIUM CHLORIDE 0.9 % IV SOLN
500.0000 mg | Freq: Once | INTRAVENOUS | Status: AC
  Administered 2019-08-22: 500 mg via INTRAVENOUS
  Filled 2019-08-22: qty 500

## 2019-08-22 MED ORDER — IPRATROPIUM BROMIDE 0.02 % IN SOLN
0.5000 mg | Freq: Four times a day (QID) | RESPIRATORY_TRACT | Status: DC
  Administered 2019-08-22: 0.5 mg via RESPIRATORY_TRACT
  Filled 2019-08-22: qty 2.5

## 2019-08-22 MED ORDER — DILTIAZEM LOAD VIA INFUSION
10.0000 mg | Freq: Once | INTRAVENOUS | Status: AC
  Administered 2019-08-22: 10 mg via INTRAVENOUS
  Filled 2019-08-22: qty 10

## 2019-08-22 MED ORDER — ONDANSETRON HCL 4 MG/2ML IJ SOLN
4.0000 mg | Freq: Four times a day (QID) | INTRAMUSCULAR | Status: DC | PRN
  Administered 2019-08-24 – 2019-09-10 (×12): 4 mg via INTRAVENOUS
  Filled 2019-08-22 (×15): qty 2

## 2019-08-22 MED ORDER — ACETAMINOPHEN 650 MG RE SUPP: 650.0000 mg | Freq: Four times a day (QID) | RECTAL | Status: DC | PRN

## 2019-08-22 MED ORDER — IPRATROPIUM-ALBUTEROL 0.5-2.5 (3) MG/3ML IN SOLN
3.0000 mL | Freq: Four times a day (QID) | RESPIRATORY_TRACT | Status: DC
  Administered 2019-08-23 (×3): 3 mL via RESPIRATORY_TRACT
  Filled 2019-08-22 (×3): qty 3

## 2019-08-22 MED ORDER — ONDANSETRON HCL 4 MG PO TABS
4.0000 mg | ORAL_TABLET | Freq: Four times a day (QID) | ORAL | Status: DC | PRN
  Administered 2019-08-24: 4 mg via ORAL
  Filled 2019-08-22 (×2): qty 1

## 2019-08-22 MED ORDER — DILTIAZEM HCL-DEXTROSE 125-5 MG/125ML-% IV SOLN (PREMIX)
5.0000 mg/h | INTRAVENOUS | Status: DC
  Administered 2019-08-22: 5 mg/h via INTRAVENOUS
  Administered 2019-08-23: 15 mg/h via INTRAVENOUS
  Filled 2019-08-22 (×2): qty 125

## 2019-08-22 MED ORDER — ACETAMINOPHEN 325 MG PO TABS
650.0000 mg | ORAL_TABLET | Freq: Four times a day (QID) | ORAL | Status: DC | PRN
  Filled 2019-08-22: qty 2

## 2019-08-22 NOTE — ED Provider Notes (Signed)
[facility] EMERGENCY DEPARTMENT Provider Note   CSN: [id number] Arrival date & time: 08/22/19  1842     History Chief Complaint  Patient presents with  . Shortness of Breath    Aaron [name] is a 64 y.o. male.  He has a history of end-stage renal disease and does peritoneal dialysis.  Also A. fib.  He is here with complaint of a few days of increased swelling on his legs along with shortness of breath and coughing up yellow phlegm.  Denies chest pain.  Coughing which causes him to feel dizzy.  Also had a fall a few days ago in which he injured his left great toe and left shoulder.  No known fevers or chills.  The history is provided by the patient.  Shortness of Breath Severity:  Moderate Onset quality:  Gradual Timing:  Constant Progression:  Worsening Chronicity:  Recurrent Context: activity   Relieved by:  Nothing Worsened by:  Activity Ineffective treatments:  None tried Associated symptoms: cough, sputum production and wheezing   Associated symptoms: no abdominal pain, no chest pain, no fever, no headaches, no hemoptysis, no rash, no sore throat and no vomiting   Risk factors: no tobacco use        Past Medical History:  Diagnosis Date  . Anxiety   . Arthritis    knees , back , shoulders (10/12/2017)  . Atrial fibrillation ([place])   . CAD (coronary artery disease)    Mild nonobstructive disease at cardiac catheterization 2002  . Chronic back pain    "back of my neck; down thru my legs" (10/12/2017)  . COPD (chronic obstructive pulmonary disease) ([place])   . Diverticulitis   . Esophageal reflux   . ESRD (end stage renal disease) on dialysis [facility])    "TTS; [place]" (11/23/2017)  . Essential hypertension   . Hepatitis C    states he no longer has this  . History of kidney stones   . History of syncope   . Hyperlipidemia   . Jerking 09/23/2014  . Myocardial infarction ([place]) 02/2017   "light one" (10/12/2017)  . Non-compliant behavior    non compliant with diaylsis per  daughter  . PAT (paroxysmal atrial tachycardia) ([place])   . Peripheral arterial disease ([place])    Occluded left superficial femoral artery status post stent June 2016 - Dr. Trula Slade  . Pneumonia 1961  . Polycystic kidney, unspecified type   . Syncope 09/2014  . SYNCOPE 05/07/2010   Qualifier: Diagnosis of  By: Laurance Flatten RN, BSN, [facility]      Patient Active Problem List   Diagnosis Date Noted  . Hyperglycemia 05/27/2019  . Syncope 05/26/2019  . Vertebral osteomyelitis ([place]) 04/02/2018  . Back pain 03/31/2018  . Noncompliance with medication regimen 03/31/2018  . CKD (chronic kidney disease) stage 5, GFR less than 15 ml/min (HCC) 02/03/2018  . COPD with acute exacerbation ([place]) 02/03/2018  . Atrial fibrillation, chronic ([place]) 02/03/2018  . Discitis 01/27/2018  . Discitis of lumbar region 01/09/2018  . Paraspinal abscess ([place]) 01/08/2018  . ESRD on dialysis ([place]) 12/30/2017  . Hematoma of arm, right, initial encounter 11/23/2017  . Protein-calorie malnutrition, severe 10/15/2017  . Hyperlipidemia 08/24/2017  . Schatzki's ring of distal esophagus 04/27/2017  . Encounter for therapeutic drug monitoring 03/03/2017  . History of hepatitis C, naturally cleared  02/20/2017  . Ischemic cardiomyopathy 01/27/2017  . Dyslipidemia 01/27/2017  . AF (paroxysmal atrial fibrillation) ([place]) 01/27/2017  . CAD (coronary artery disease) 01/21/2017  . Polycystic  kidney disease 12/14/2016  . Syncope, vasovagal 09/23/2014  . COPD (chronic obstructive pulmonary disease) ([place])   . COLD (chronic obstructive lung disease) ([place])   . Depression   . PAD (peripheral artery disease) ([place]) 09/19/2014  . Preop cardiovascular exam 10/28/2010  . Tobacco abuse 10/28/2010  . UNSPECIFIED IRON DEFICIENCY ANEMIA 10/04/2009  . 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  . Essential hypertension 10/04/2009  . Esophageal reflux 10/04/2009  . PRECORDIAL PAIN 10/04/2009    Past Surgical History:  Procedure Laterality Date  .  ABDOMINAL AORTOGRAM N/A 08/11/2017   Procedure: ABDOMINAL AORTOGRAM;  Surgeon: Serafina Mitchell, MD;  Location: [place] CV LAB;  Service: Cardiovascular;  Laterality: N/A;  . ANTERIOR CERVICAL DECOMP/DISCECTOMY FUSION    . APPLICATION OF WOUND VAC Right 08/14/2017   Procedure: APPLICATION OF PREVENA INCISIONAL WOUND VAC RIGHT GROIN;  Surgeon: Serafina Mitchell, MD;  Location: [facility] OR;  Service: Vascular;  Laterality: Right;  . AV FISTULA PLACEMENT Left 09/04/2017   Procedure: ARTERIOVENOUS (AV) FISTULA CREATION LEFT UPPER ARM;  Surgeon: Serafina Mitchell, MD;  Location: [place];  Service: Vascular;  Laterality: Left;  . BACK SURGERY    . BASCILIC VEIN TRANSPOSITION Left 11/20/2017   Procedure: SECOND STAGE BASILIC VEIN TRANSPOSITION LEFT ARM;  Surgeon: Serafina Mitchell, MD;  Location: [place];  Service: Vascular;  Laterality: Left;  . BIOPSY  12/17/2016   Procedure: BIOPSY;  Surgeon: Daneil Dolin, MD;  [facility];  Service: Gastroenterology;;  gastric colon  . BIOPSY  04/29/2017   Procedure: BIOPSY;  Surgeon: Daneil Dolin, MD;  [facility];  Service: Endoscopy;;  duodenal biopsies  . BUNIONECTOMY Bilateral   . COLONOSCOPY  2008   Dr. Oneida Alar: rare sigmoid colon diverticulosis, internal hemorrhoids.   . COLONOSCOPY WITH PROPOFOL N/A 12/17/2016   dense left-sided diverticulosis, right colon ulcers s/p biopsy query occult NSAID use vs transient ischemia, not consistent with IBD. CMV stains negative.   Marland Kitchen ENDARTERECTOMY FEMORAL Right 08/14/2017   Procedure: RIGHT ILLIO-FEMORAL ENDARTERECTOMY;  Surgeon: Serafina Mitchell, MD;  Location[facility];  Service: Vascular;  Laterality: Right;  . ESOPHAGEAL DILATION  12/17/2016   EGD with mild Schatzki's ring s/p dilatation, small hiatal hernia, erosive gastropathy (negative H.pylori gastritis)  . ESOPHAGOGASTRODUODENOSCOPY  2008   Dr. Oneida Alar: normal esophagus without Barrett's, antritis and duodenitis, path with H.pylori gastritis  .  ESOPHAGOGASTRODUODENOSCOPY (EGD) WITH PROPOFOL N/A 12/17/2016   Procedure: ESOPHAGOGASTRODUODENOSCOPY (EGD) WITH PROPOFOL;  Surgeon: Daneil Dolin, MD;  [facility];  Service: Gastroenterology;  Laterality: N/A;  . ESOPHAGOGASTRODUODENOSCOPY (EGD) WITH PROPOFOL N/A 04/29/2017   Patchy erythema of gastric mucosa diffusely, extensive inflammatory changes in duodenum, geographic ulceration and mucosal edema present, encroaching somewhat on the lumen yet still widely patent, distal second portion of duodenum appeared abnormal, path with peptic duodenitis with ulceration  . GROIN DEBRIDEMENT Right 10/14/2017   Procedure: RIGHT GROIN AND RIGHT LOWER QUADRANT ABDOMEN DEBRIDEMENT WITH PLACEMENT OF ANTIBIOTIC BEADS;  Surgeon: Serafina Mitchell, MD;  Location: [place];  Service: Vascular;  Laterality: Right;  . HEMATOMA EVACUATION Left 12/06/2017   Procedure: EVACUATION HEMATOMA;  Surgeon: Waynetta Sandy, MD;  Location: [place];  Service: Vascular;  Laterality: Left;  . HERNIA REPAIR  [id number]   umbilical  . I & D EXTREMITY Right 09/21/2017   Procedure: IRRIGATION AND DEBRIDEMENT GROIN;  Surgeon: Elam Dutch, MD;  Location: [place];  Service: Vascular;  Laterality: Right;  . I & D EXTREMITY  Left 11/23/2017   Procedure: Evacuation Hematoma LEFT UPPER ARM GRAFT;  Surgeon: Elam Dutch, MD;  Location: [place];  Service: Vascular;  Laterality: Left;  . INGUINAL HERNIA REPAIR Right 02/2017   [place]  . INSERTION OF DIALYSIS CATHETER Right 09/04/2017   Procedure: INSERTION OF TUNNELED  DIALYSIS CATHETER - RIGHT INTERNAL JUGULAR PLACEMENT;  Surgeon: Serafina Mitchell, MD;  Location: [place];  Service: Vascular;  Laterality: Right;  . INSERTION OF DIALYSIS CATHETER Right 07/26/2018   Procedure: INSERTION OF DIALYSIS CATHETER RIGHT INTERNAL JUGULAR;  Surgeon: Marty Heck, MD;  Location: [place];  Service: Vascular;  Laterality: Right;  . INSERTION OF ILIAC STENT Right 08/14/2017   Procedure:  INSERTION OF RIGHT COMMON ILIAC STENT 63mm x 31mm x 130cm INSERTION OF RIGHT EXTERNAL ILIAC STENT 80mm x 81mm x 130cm INSERTION OF SUPERFICIAL FERMORAL ARTERY STENT 25mm x 3mm x 130cm;  Surgeon: Serafina Mitchell, MD;  Location[facility];  Service: Vascular;  Laterality: Right;  . IR FLUORO GUIDE CV LINE RIGHT  08/31/2017  . IR REMOVAL TUN CV CATH W/O FL  05/28/2018  . IR REMOVAL TUN CV CATH W/O FL  03/07/2019  . IR US GUIDE VASC ACCESS RIGHT  08/31/2017  . LOWER EXTREMITY ANGIOGRAPHY Right 08/11/2017   Procedure: LOWER EXTREMITY ANGIOGRAPHY;  Surgeon: Serafina Mitchell, MD;  Location: [place] CV LAB;  Service: Cardiovascular;  Laterality: Right;  . PATCH ANGIOPLASTY Right 08/14/2017   Procedure: PATCH ANGIOPLASTY USING HEMASHIELD PATCH 0.3IN Lillie [place];  Surgeon: Serafina Mitchell, MD;  [facility];  Service: Vascular;  Laterality: Right;  . PERIPHERAL VASCULAR CATHETERIZATION N/A 09/20/2014   Procedure: Abdominal Aortogram;  Surgeon: Serafina Mitchell, MD;  Location: [place] CV LAB;  Service: Cardiovascular;  Laterality: N/A;  . POSTERIOR FUSION LUMBAR SPINE    . TEE WITHOUT CARDIOVERSION N/A 01/12/2018   Procedure: TRANSESOPHAGEAL ECHOCARDIOGRAM (TEE);  Surgeon: Lelon Perla, MD;  Location: [facility] ENDOSCOPY;  Service: Cardiovascular;  Laterality: N/A;       Family History  Problem Relation Age of Onset  . Alcoholism Mother   . Heart disease Father        Massive heart attack  . Heart attack Father   . Atrial fibrillation Father   . Colon cancer Father   . Colon cancer Maternal Grandfather 12  . Alcoholism Maternal Grandfather   . Renal cancer Cousin   . Ovarian cancer Sister     Social History   Tobacco Use  . Smoking status: Current Every Day Smoker    Packs/day: 0.25    Years: 47.00    Pack years: 11.75    Types: Cigarettes    Last attempt to quit: 08/03/2017    Years since quitting: 2.0  . Smokeless tobacco: Never Used  Substance Use Topics  . Alcohol use: Not Currently     Comment: 10/12/2017 alcohol free since 2017,  heavy drinker in the past  . Drug use: Not Currently    Comment: "<2003 whatever was around; nothing since"    Home Medications Prior to Admission medications   Medication Sig Start Date End Date Taking? Authorizing Provider  albuterol (PROVENTIL HFA;VENTOLIN HFA) 108 (90 Base) MCG/ACT inhaler Inhale 2 puffs into the lungs every 6 (six) hours as needed for wheezing or shortness of breath.    [provider]  albuterol (PROVENTIL) (2.5 MG/3ML) 0.083% nebulizer solution Inhale 3 mLs into the lungs 3 (three) times daily as needed for shortness of breath or  wheezing. 06/23/18   [provider]  apixaban (ELIQUIS) 5 MG TABS tablet Take 1 tablet (5 mg total) by mouth 2 (two) times daily. 05/28/19   Geradine Girt, DO  atorvastatin (LIPITOR) 80 MG tablet Take 80 mg by mouth daily. 04/25/19   [provider]  BREO ELLIPTA 200-25 MCG/INH AEPB Inhale 1 puff into the lungs daily as needed (for respiratory issues.).  07/27/17   [provider]  calcium acetate (PHOSLO) 667 MG capsule Take 667 mg by mouth 3 (three) times daily with meals. 06/29/18   [provider]  carvedilol (COREG) 25 MG tablet Take 25 mg by mouth 2 (two) times daily. 04/25/19   [provider]  CHANTIX 0.5 MG tablet Take 0.5 mg by mouth daily. 04/13/19   [provider]  Cholecalciferol (VITAMIN D3) 50 MCG (2000 UT) TABS Take 2,000 Units by mouth daily. 06/28/18   [provider]  clopidogrel (PLAVIX) 75 MG tablet Take 75 mg by mouth daily. 06/28/18   [provider]  diltiazem (CARDIZEM) 60 MG tablet Take 1 tablet (60 mg total) by mouth every 6 (six) hours. 05/28/19   Geradine Girt, DO  gabapentin (NEURONTIN) 300 MG capsule Take 300 mg by mouth 2 (two) times daily. 07/07/18   [provider]  hydrALAZINE (APRESOLINE) 25 MG tablet Take 1 tablet (25 mg total) by mouth every 8 (eight) hours. 05/28/19   Geradine Girt, DO  isosorbide mononitrate (IMDUR) 60 MG 24 hr tablet Take 60 mg by mouth daily. 07/07/18   [provider]  oxyCODONE-acetaminophen (PERCOCET) 10-325 MG tablet Take 1 tablet by mouth 5 (five) times daily as needed for pain.     [provider]  pantoprazole (PROTONIX) 40 MG tablet Take 1 tablet (40 mg total) by mouth 2 (two) times daily. Patient taking differently: Take 40 mg by mouth daily.  04/30/17 01/27/26  Manuella Ghazi, Pratik D, DO  predniSONE (DELTASONE) 20 MG tablet 40 mg x 3 more days with breakfast 05/29/19   Eulogio Bear U, DO  sodium bicarbonate 650 MG tablet Take 1 tablet (650 mg total) by mouth daily. Patient taking differently: Take 650 mg by mouth 3 (three) times daily.  04/04/18   Orson Eva, MD    Allergies    Patient has no known allergies.  Review of Systems   Review of Systems  Constitutional: Negative for fever.  HENT: Negative for sore throat.   Eyes: Negative for visual disturbance.  Respiratory: Positive for cough, sputum production, shortness of breath and wheezing. Negative for hemoptysis.   Cardiovascular: Positive for leg swelling. Negative for chest pain.  Gastrointestinal: Negative for abdominal pain, nausea and vomiting.  Genitourinary: Negative for testicular pain.  Musculoskeletal: Positive for gait problem.  Skin: Positive for wound (left toe). Negative for rash.  Neurological: Negative for headaches.    Physical Exam Updated Vital Signs BP (!) 147/108 (BP Location: Right Arm)   Pulse (!) 150   Temp 98.4 F (36.9 C) (Oral)   Resp (!) 24   Ht 6\' 4"  (1.93 m)   Wt 86.6 kg   SpO2 97%   BMI 23.25 kg/m   Physical Exam Vitals and nursing note reviewed.  Constitutional:      Appearance: He is well-developed.  HENT:     Head: Normocephalic and atraumatic.  Eyes:     Conjunctiva/sclera: Conjunctivae normal.  Cardiovascular:     Rate and Rhythm: Tachycardia present. Rhythm irregular.     Heart sounds: No  murmur.  Pulmonary:      Effort: Tachypnea and accessory muscle usage present. No respiratory distress.     Breath sounds: Normal breath sounds.  Abdominal:     Palpations: Abdomen is soft.     Tenderness: There is no abdominal tenderness.  Musculoskeletal:     Cervical back: Neck supple.     Right lower leg: Tenderness present. Edema present.     Left lower leg: Tenderness present. Edema present.     Comments: Left great toe is ecchymotic and tender.  Skin:    General: Skin is warm and dry.  Neurological:     General: No focal deficit present.     Mental Status: He is alert.     ED Results / Procedures / Treatments   Labs (all labs ordered are listed, but only abnormal results are displayed) Labs Reviewed  BASIC METABOLIC PANEL - Abnormal; Notable for the following components:      Result Value   CO2 21 (*)    BUN 58 (*)    Creatinine, Ser 6.79 (*)    Calcium 7.5 (*)    GFR calc non Af Amer 8 (*)    GFR calc Af Amer 9 (*)    All other components within normal limits  CBC - Abnormal; Notable for the following components:   WBC 10.8 (*)    RBC 3.72 (*)    Hemoglobin 12.0 (*)    HCT 37.1 (*)    All other components within normal limits  BRAIN NATRIURETIC PEPTIDE - Abnormal; Notable for the following components:   B Natriuretic Peptide 1,578.0 (*)    All other components within normal limits  TROPONIN I (HIGH SENSITIVITY) - Abnormal; Notable for the following components:   Troponin I (High Sensitivity) 68 (*)    All other components within normal limits  TROPONIN I (HIGH SENSITIVITY) - Abnormal; Notable for the following components:   Troponin I (High Sensitivity) 67 (*)    All other components within normal limits  SARS CORONAVIRUS 2 BY RT PCR (HOSPITAL ORDER, [place] LAB)  CULTURE, BLOOD (ROUTINE X 2)  CULTURE, BLOOD (ROUTINE X 2)  MAGNESIUM  CBC  BASIC METABOLIC PANEL    EKG EKG Interpretation  Date/Time:  Monday Aug 22 2019 18:49:48 EDT Ventricular Rate:    143 PR Interval:    QRS Duration: 94 QT Interval:  322 QTC Calculation: 496 R Axis:   -33 Text Interpretation: Atrial fibrillation with rapid ventricular response with premature ventricular or aberrantly conducted complexes Left axis deviation Minimal voltage criteria for LVH, may be normal variant ( Cornell product ) Anteroseptal infarct , age undetermined ST & T wave abnormality, consider lateral ischemia Abnormal ECG increased rate and ischemic changes comapred with prior 2/21 Confirmed by Aletta Edouard [phone]) on 08/22/2019 7:05:31 PM   Radiology DG Chest Port 1 View  Result Date: 08/22/2019 CLINICAL DATA:  Shortness of breath congestion EXAM: PORTABLE CHEST 1 VIEW COMPARISON:  May 26, 2019 FINDINGS: There is unchanged cardiomegaly. Aortic knob calcifications are seen. There is hyperinflation of the upper lung zones. Ill-defined interstitial reticulonodular opacity seen at the right lung base, more pronounced than on the prior exam. No pleural effusion is seen. No acute osseous abnormality. IMPRESSION: Interstitial reticulonodular opacity more pronounced at the right lung base than on the prior exam which could be due to atelectasis and/or infectious etiology. Findings suggestive of underlying COPD. Electronically Signed   By: Prudencio Pair M.D.   On: 08/22/2019  19:41   DG Foot Complete Left  Result Date: 08/22/2019 CLINICAL DATA:  [place], great toe pain EXAM: LEFT FOOT - COMPLETE 3+ VIEW COMPARISON:  12/23/2017 FINDINGS: Frontal, oblique, and lateral views of the left foot are obtained. Chronic posttraumatic/postsurgical changes at the first metatarsal. Prior healed second metatarsal fracture unchanged. 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 Diffuse hammertoe deformities are noted. There are no acute displaced fractures. Prominent dorsal soft tissue swelling of the foot. Diffuse vascular calcifications. IMPRESSION: 1. Chronic postsurgical/posttraumatic  changes of the first and second metatarsal. 2. Stable severe first metatarsophalangeal joint osteoarthritis. 3. Dorsal soft tissue swelling. 4. No acute fracture. Electronically Signed   By: Randa Ngo M.D.   On: 08/22/2019 19:41    Procedures .Critical Care Performed by: Hayden Rasmussen, MD Authorized by: Hayden Rasmussen, MD   Critical care provider statement:    Critical care time (minutes):  45   Critical care was necessary to treat or prevent imminent or life-threatening deterioration of the following conditions:  Cardiac failure, circulatory failure and respiratory failure   Critical care was time spent personally by me on the following activities:  Discussions with consultants, evaluation of patient's response to treatment, examination of patient, ordering and performing treatments and interventions, ordering and review of laboratory studies, ordering and review of radiographic studies, pulse oximetry, re-evaluation of patient's condition, obtaining history from patient or surrogate, review of old charts and development of treatment plan with patient or surrogate   (including critical care time)  Medications Ordered in ED Medications  diltiazem (CARDIZEM) 1 mg/mL load via infusion 10 mg (10 mg Intravenous Bolus from Bag 08/22/19 1923)    And  diltiazem (CARDIZEM) 125 mg in dextrose 5% 125 mL (1 mg/mL) infusion (15 mg/hr Intravenous Rate/Dose Verify 08/22/19 2118)  azithromycin (ZITHROMAX) 500 mg in sodium chloride 0.9 % 250 mL IVPB (has no administration in time range)    ED Course  I have reviewed the triage vital signs and the nursing notes.  Pertinent labs & imaging results that were available during my care of the patient were reviewed by me and considered in my medical decision making (see chart for details).  Clinical Course as of Aug 22 2315  Mon Aug 22, 2019  2013 Started on diltiazem drip for rapid A. fib.   [MB]  2122 Discussed with [facility] hospitalist Dr. Humphrey Rolls.   He asked that we get blood cultures and start him on some IV Zithromax.   [MB]  2152 Discussed with Dr. Posey Pronto nephrology.  He says he does not need any special floor requirement for peritoneal dialysis.  He asked that when he arrives to call and they call him and he will arrange for him to be seen.   [MB]    Clinical Course User Index [MB] Hayden Rasmussen, MD   MDM Rules/Calculators/A&P                     This patient complains of cough shortness of breath leg swelling left great toe pain; this involves an extensive number of treatment Options and is a complaint that carries with it a high risk of complications and Morbidity. The differential includes CHF, COPD, rapid A. fib, ischemia, peripheral vascular disease, PE, pneumothorax  I ordered, reviewed and interpreted labs, which included CBC with a slightly elevated white count and stable hemoglobin.  Chemistry showing elevated BUN and creatinine consistent with his renal failure.  Elevated BNP of 1578.  Covid testing  negative. I ordered medications  diltiazem drip, antibiotics, and inhalation treatments  I ordered imaging studies which included chest x-ray and left foot x-ray and I independently    visualized and interpreted imaging which showed COPD, atelectasis versus infiltrate right base.  Left foot showed arthritic changes but no gross fracture Previous records obtained and reviewed in epic I consulted nephrology Dr. Posey Pronto and [facility] hospitalist Dr. Humphrey Rolls and discussed lab and imaging findings  Critical Interventions: Management of rapid A. fib with IV diltiazem drip  After the interventions stated above, I reevaluated the patient and found patient's heart rate to be improving.  Have started on antibiotics and breathing treatments.  Will need [facility] to [facility] as is a peritoneal dialysis patient and they do not do that at this facility.  CHA2DS2/VAS Stroke Risk Points  Current as of 6 minutes ago     3 >= 2 Points: High  Risk  1 - 1.99 Points: Medium Risk  0 Points: Low Risk    This is the only CHA2DS2/VAS Stroke Risk Points available for the past  year.: Last Change: N/A     Details    This score determines the patient's risk of having a stroke if the  patient has atrial fibrillation.       Points Metrics  1 Has Congestive Heart Failure:  Yes    Current as of 6 minutes ago  1 Has Vascular Disease:  Yes    Current as of 6 minutes ago  1 Has Hypertension:  Yes    Current as of 6 minutes ago  0 Age:  71    Current as of 6 minutes ago  0 Has Diabetes:  No    Current as of 6 minutes ago  0 Had Stroke:  No  Had TIA:  No  Had thromboembolism:  No    Current as of 6 minutes ago  0 Male:  No    Current as of 6 minutes ago           Final Clinical Impression(s) / ED Diagnoses Final diagnoses:  Atrial fibrillation with rapid ventricular response (HCC)  Acute on chronic congestive heart failure, unspecified heart failure type ([place])  ESRD on peritoneal dialysis ([place])  Contusion of left great toe without damage to nail, initial encounter    Rx / DC Orders ED Discharge Orders    None       Hayden Rasmussen, MD 08/23/19 1011

## 2019-08-22 NOTE — ED Triage Notes (Signed)
EMS called out for SOB.  Pt has swelling to BLE and lung congestion.

## 2019-08-22 NOTE — Progress Notes (Addendum)
Rt added PRN albuterol Q4  d/t inspiratory and expiratory wheezing.  Pt takes inhalers of albuterol at home with no adverse reaction.  After initial tx, pt sounded as wheezy as before, maybe worse.  Rt changed Atrovent neb to Duoneb so pt gets benefit of both bronchodilators when receiving breathing tx.    Pt encouraged to cough d/t congestion to help clear his airways.  Pt on no O2 at this time but sats remain around 92% on RA.  Pt may need supplemental O2 for sleep.  Rt will continue to monitor.

## 2019-08-23 DIAGNOSIS — I4891 Unspecified atrial fibrillation: Secondary | ICD-10-CM | POA: Diagnosis present

## 2019-08-23 DIAGNOSIS — R0600 Dyspnea, unspecified: Secondary | ICD-10-CM

## 2019-08-23 DIAGNOSIS — I509 Heart failure, unspecified: Secondary | ICD-10-CM

## 2019-08-23 LAB — BASIC METABOLIC PANEL
Anion gap: 14 (ref 5–15)
BUN: 57 mg/dL — ABNORMAL HIGH (ref 8–23)
CO2: 23 mmol/L (ref 22–32)
Calcium: 7.3 mg/dL — ABNORMAL LOW (ref 8.9–10.3)
Chloride: 100 mmol/L (ref 98–111)
Creatinine, Ser: 6.87 mg/dL — ABNORMAL HIGH (ref 0.61–1.24)
GFR calc Af Amer: 9 mL/min — ABNORMAL LOW (ref >60)
GFR calc non Af Amer: 8 mL/min — ABNORMAL LOW (ref >60)
Glucose, Bld: 131 mg/dL — ABNORMAL HIGH (ref 70–99)
Potassium: 3.9 mmol/L (ref 3.5–5.1)
Sodium: 137 mmol/L (ref 135–145)

## 2019-08-23 LAB — CBC
HCT: 36.3 % — ABNORMAL LOW (ref 39.0–52.0)
Hemoglobin: 11.5 g/dL — ABNORMAL LOW (ref 13.0–17.0)
MCH: 31.8 pg (ref 26.0–34.0)
MCHC: 31.7 g/dL (ref 30.0–36.0)
MCV: 100.3 fL — ABNORMAL HIGH (ref 80.0–100.0)
Platelets: 201 10*3/uL (ref 150–400)
RBC: 3.62 MIL/uL — ABNORMAL LOW (ref 4.22–5.81)
RDW: 14.6 % (ref 11.5–15.5)
WBC: 9.9 10*3/uL (ref 4.0–10.5)
nRBC: 0 % (ref 0.0–0.2)

## 2019-08-23 LAB — PHOSPHORUS: Phosphorus: 7.1 mg/dL — ABNORMAL HIGH (ref 2.5–4.6)

## 2019-08-23 MED ORDER — CLOPIDOGREL BISULFATE 75 MG PO TABS
75.0000 mg | ORAL_TABLET | Freq: Every day | ORAL | Status: DC
  Administered 2019-08-23 – 2019-08-25 (×3): 75 mg via ORAL
  Filled 2019-08-23 (×3): qty 1

## 2019-08-23 MED ORDER — APIXABAN 2.5 MG PO TABS
2.5000 mg | ORAL_TABLET | Freq: Two times a day (BID) | ORAL | Status: DC
  Administered 2019-08-23 – 2019-08-24 (×2): 2.5 mg via ORAL
  Filled 2019-08-23 (×2): qty 1

## 2019-08-23 MED ORDER — DILTIAZEM HCL 60 MG PO TABS
60.0000 mg | ORAL_TABLET | Freq: Four times a day (QID) | ORAL | Status: DC
  Administered 2019-08-23 – 2019-08-29 (×23): 60 mg via ORAL
  Filled 2019-08-23 (×23): qty 1

## 2019-08-23 MED ORDER — SEVOFLURANE IN SOLN
RESPIRATORY_TRACT | Status: AC
  Filled 2019-08-23: qty 250

## 2019-08-23 MED ORDER — HYDRALAZINE HCL 25 MG PO TABS
25.0000 mg | ORAL_TABLET | Freq: Three times a day (TID) | ORAL | Status: DC
  Administered 2019-08-23 – 2019-08-25 (×5): 25 mg via ORAL
  Filled 2019-08-23 (×5): qty 1

## 2019-08-23 MED ORDER — CHLORHEXIDINE GLUCONATE CLOTH 2 % EX PADS
6.0000 | MEDICATED_PAD | Freq: Every day | CUTANEOUS | Status: DC
  Administered 2019-08-23 – 2019-09-11 (×18): 6 via TOPICAL

## 2019-08-23 MED ORDER — IPRATROPIUM-ALBUTEROL 0.5-2.5 (3) MG/3ML IN SOLN
3.0000 mL | Freq: Three times a day (TID) | RESPIRATORY_TRACT | Status: DC
  Administered 2019-08-24: 3 mL via RESPIRATORY_TRACT
  Filled 2019-08-23: qty 3

## 2019-08-23 MED ORDER — VARENICLINE TARTRATE 0.5 MG PO TABS: 0.5000 mg | ORAL_TABLET | Freq: Every day | ORAL | Status: DC

## 2019-08-23 MED ORDER — BUDESONIDE 0.25 MG/2ML IN SUSP
0.2500 mg | Freq: Two times a day (BID) | RESPIRATORY_TRACT | Status: DC
  Administered 2019-08-23 – 2019-09-11 (×35): 0.25 mg via RESPIRATORY_TRACT
  Filled 2019-08-23 (×35): qty 2

## 2019-08-23 MED ORDER — ISOSORBIDE MONONITRATE ER 60 MG PO TB24
60.0000 mg | ORAL_TABLET | Freq: Every day | ORAL | Status: DC
  Administered 2019-08-23 – 2019-08-28 (×6): 60 mg via ORAL
  Filled 2019-08-23 (×7): qty 1

## 2019-08-23 MED ORDER — GABAPENTIN 300 MG PO CAPS
300.0000 mg | ORAL_CAPSULE | Freq: Two times a day (BID) | ORAL | Status: DC
  Administered 2019-08-23: 300 mg via ORAL
  Filled 2019-08-23: qty 1

## 2019-08-23 MED ORDER — OXYCODONE-ACETAMINOPHEN 5-325 MG PO TABS
1.0000 | ORAL_TABLET | Freq: Four times a day (QID) | ORAL | Status: DC | PRN
  Administered 2019-08-23 – 2019-08-24 (×3): 2 via ORAL
  Administered 2019-08-24: 1 via ORAL
  Administered 2019-08-25 – 2019-08-27 (×5): 2 via ORAL
  Administered 2019-08-27: 1 via ORAL
  Administered 2019-08-28: 2 via ORAL
  Administered 2019-08-28 – 2019-08-31 (×3): 1 via ORAL
  Administered 2019-09-01: 2 via ORAL
  Administered 2019-09-03: 1 via ORAL
  Administered 2019-09-06 – 2019-09-11 (×16): 2 via ORAL
  Filled 2019-08-23 (×11): qty 2
  Filled 2019-08-23: qty 1
  Filled 2019-08-23 (×11): qty 2
  Filled 2019-08-23: qty 1
  Filled 2019-08-23 (×3): qty 2
  Filled 2019-08-23 (×5): qty 1
  Filled 2019-08-23 (×4): qty 2

## 2019-08-23 MED ORDER — GENTAMICIN SULFATE 0.1 % EX CREA
1.0000 "application " | TOPICAL_CREAM | Freq: Every day | CUTANEOUS | Status: DC
  Administered 2019-08-25 – 2019-09-10 (×17): 1 via TOPICAL
  Filled 2019-08-23 (×2): qty 15

## 2019-08-23 MED ORDER — CARVEDILOL 12.5 MG PO TABS: 25.0000 mg | ORAL_TABLET | Freq: Two times a day (BID) | ORAL | Status: DC

## 2019-08-23 MED ORDER — METHYLPREDNISOLONE SODIUM SUCC 40 MG IJ SOLR
40.0000 mg | Freq: Two times a day (BID) | INTRAMUSCULAR | Status: DC
  Administered 2019-08-23 – 2019-08-25 (×5): 40 mg via INTRAVENOUS
  Filled 2019-08-23 (×5): qty 1

## 2019-08-23 MED ORDER — FUROSEMIDE 10 MG/ML IJ SOLN
80.0000 mg | Freq: Once | INTRAMUSCULAR | Status: AC
  Administered 2019-08-23: 80 mg via INTRAVENOUS
  Filled 2019-08-23: qty 8

## 2019-08-23 MED ORDER — AZITHROMYCIN 500 MG PO TABS
500.0000 mg | ORAL_TABLET | Freq: Every day | ORAL | Status: DC
  Administered 2019-08-23 – 2019-08-24 (×2): 500 mg via ORAL
  Filled 2019-08-23 (×2): qty 1

## 2019-08-23 MED ORDER — ATORVASTATIN CALCIUM 80 MG PO TABS
80.0000 mg | ORAL_TABLET | Freq: Every day | ORAL | Status: DC
  Administered 2019-08-23 – 2019-09-11 (×18): 80 mg via ORAL
  Filled 2019-08-23 (×5): qty 1
  Filled 2019-08-23: qty 2
  Filled 2019-08-23 (×13): qty 1

## 2019-08-23 MED ORDER — DILTIAZEM HCL 30 MG PO TABS: 60.0000 mg | ORAL_TABLET | Freq: Four times a day (QID) | ORAL | Status: DC

## 2019-08-23 MED ORDER — APIXABAN 5 MG PO TABS
5.0000 mg | ORAL_TABLET | Freq: Two times a day (BID) | ORAL | Status: DC
  Administered 2019-08-23: 5 mg via ORAL
  Filled 2019-08-23: qty 1

## 2019-08-23 MED ORDER — GABAPENTIN 300 MG PO CAPS
300.0000 mg | ORAL_CAPSULE | Freq: Every day | ORAL | Status: DC
  Administered 2019-08-24 – 2019-09-10 (×16): 300 mg via ORAL
  Filled 2019-08-23 (×17): qty 1

## 2019-08-23 MED ORDER — VITAMIN D3 50 MCG (2000 UT) PO TABS: 2000.0000 [IU] | ORAL_TABLET | Freq: Every day | ORAL | Status: DC

## 2019-08-23 MED ORDER — PANTOPRAZOLE SODIUM 40 MG PO TBEC
40.0000 mg | DELAYED_RELEASE_TABLET | Freq: Two times a day (BID) | ORAL | Status: DC
  Administered 2019-08-23 – 2019-08-25 (×5): 40 mg via ORAL
  Filled 2019-08-23 (×5): qty 1

## 2019-08-23 MED ORDER — CARVEDILOL 25 MG PO TABS
25.0000 mg | ORAL_TABLET | Freq: Two times a day (BID) | ORAL | Status: DC
  Administered 2019-08-23 – 2019-08-28 (×12): 25 mg via ORAL
  Filled 2019-08-23 (×12): qty 1

## 2019-08-23 NOTE — Progress Notes (Signed)
Per HPI: Aaron [name] is a 64 y.o. male with medical history significant of hypertension hyperlipidemia, atrial fibrillation on Eliquis, ESRD on peritoneal dialysis presented to ED for evaluation of worsening shortness of breath.  Patient states that he has noticed swelling in bilateral lower extremities for the last few days and it is also related with worsening shortness of breath, wheezing and productive cough.  Today he started feeling very weak and his heart is also beating very fast.  Patient mentioned that his cough is bringing thick yellowish phlegm but he has no fever, chills, chest pain, nausea, vomiting, abdominal pain, diarrhea and constipation.  Patient further mentioned that he is on peritoneal dialysis and does his dialysis regularly but having abdominal distention and lower extremity swelling that is getting worse.  5/11: Patient appears comfortable on room air and has heart rates in the low 100 range on IV Cardizem drip.  Plan to wean drip as tolerated and reinitiate home Cardizem as well as Coreg.  Adjust Eliquis dose to 2.5 mg twice daily given his renal function.  Appreciate nephrology evaluation and it appears that patient is making urine and therefore will receive IV Lasix.  Initiate IV steroids for COPD exacerbation as well as Pulmicort in addition to his azithromycin and breathing treatments.  He is Covid testing has returned negative.  He is currently awaiting transfer to progressive unit at [facility].  Total care time: 35 minutes.

## 2019-08-23 NOTE — H&P (Signed)
History and Physical    [name] OVZ:[id number] DOB: 11-13-1955 DOA: 08/22/2019  PCP: [facility] (Confirm with patient/family/NH records and if not entered, this has to be entered at [facility] point of entry) Patient coming from: Home  I have personally briefly reviewed patient's old medical records in [place]  Chief Complaint: Worsening shortness of breath  HPI: Aaron [name] is a 64 y.o. male with medical history significant of hypertension hyperlipidemia, atrial fibrillation on Eliquis, ESRD on peritoneal dialysis presented to ED for evaluation of worsening shortness of breath.  Patient states that he has noticed swelling in bilateral lower extremities for the last few days and it is also related with worsening shortness of breath, wheezing and productive cough.  Today he started feeling very weak and his heart is also beating very fast.  Patient mentioned that his cough is bringing thick yellowish phlegm but he has no fever, chills, chest pain, nausea, vomiting, abdominal pain, diarrhea and constipation.  Patient further mentioned that he is on peritoneal dialysis and does his dialysis regularly but having abdominal distention and lower extremity swelling that is getting worse.  ED Course: On arrival to the ED patient had blood pressure of 147/108, heart rate 150, temperature 98.4, respiratory rate 24 and oxygen saturation 97% on room air.  Blood work showed WBC 10.8, hemoglobin 12, sodium 136, potassium 3.8, BUN 58, creatinine 6.79, blood glucose 98.  BNP 1578.  Troponin 68 unchanged cardiomegaly and ill-defined interstitial reticulonodular opacity at the right lung base and no pleural effusion.  EKG showed atrial fibrillation with rapid ventricular response at the rate of 143 bpm left axis deviation.  Patient is managed with Cardizem load followed by Cardizem drip for atrial fibrillation with RVR and albuterol that azithromycin for COPD exacerbation with  productive cough.  ED physician contacted Dr. Patel/nephrology who recommended to admit patient to [facility] and he should be informed once the patient reaches [facility].    Review of Systems: As per HPI otherwise 10 point review of systems negative.  Unacceptable ROS statements: "10 systems reviewed," "Extensive" (without elaboration).  Acceptable ROS statements: "All others negative," "All others reviewed and are negative," and "All others unremarkable," with at [place] documented Can't double dip - if using for HPI can't use for ROS  Past Medical History:  Diagnosis Date  . Anxiety   . Arthritis    knees , back , shoulders (10/12/2017)  . Atrial fibrillation ([place])   . CAD (coronary artery disease)    Mild nonobstructive disease at cardiac catheterization 2002  . Chronic back pain    "back of my neck; down thru my legs" (10/12/2017)  . COPD (chronic obstructive pulmonary disease) ([place])   . Diverticulitis   . Esophageal reflux   . ESRD (end stage renal disease) on dialysis [facility])    "TTS; [place]" (11/23/2017)  . Essential hypertension   . Hepatitis C    states he no longer has this  . History of kidney stones   . History of syncope   . Hyperlipidemia   . Jerking 09/23/2014  . Myocardial infarction ([place]) 02/2017   "light one" (10/12/2017)  . Non-compliant behavior    non compliant with diaylsis per daughter  . PAT (paroxysmal atrial tachycardia) ([place])   . Peripheral arterial disease ([place])    Occluded left superficial femoral artery status post stent June 2016 - Dr. Trula Slade  . Pneumonia 1961  . Polycystic kidney, unspecified type   .  Syncope 09/2014  . SYNCOPE 05/07/2010   Qualifier: Diagnosis of  By: Laurance Flatten RN, BSN, Anderson Malta      Past Surgical History:  Procedure Laterality Date  . ABDOMINAL AORTOGRAM N/A 08/11/2017   Procedure: ABDOMINAL AORTOGRAM;  Surgeon: Serafina Mitchell, MD;  Location: [place] CV LAB;  Service: Cardiovascular;  Laterality: N/A;  . ANTERIOR  CERVICAL DECOMP/DISCECTOMY FUSION    . APPLICATION OF WOUND VAC Right 08/14/2017   Procedure: APPLICATION OF PREVENA INCISIONAL WOUND VAC RIGHT GROIN;  Surgeon: Serafina Mitchell, MD;  [facility]: [facility] OR;  Service: Vascular;  Laterality: Right;  . AV FISTULA PLACEMENT Left 09/04/2017   Procedure: ARTERIOVENOUS (AV) FISTULA CREATION LEFT UPPER ARM;  Surgeon: Serafina Mitchell, MD;  Location: [place];  Service: Vascular;  Laterality: Left;  . BACK SURGERY    . BASCILIC VEIN TRANSPOSITION Left 11/20/2017   Procedure: SECOND STAGE BASILIC VEIN TRANSPOSITION LEFT ARM;  Surgeon: Serafina Mitchell, MD;  Location: [place];  Service: Vascular;  Laterality: Left;  . BIOPSY  12/17/2016   Procedure: BIOPSY;  Surgeon: Daneil Dolin, MD;  [facility];  Service: Gastroenterology;;  gastric colon  . BIOPSY  04/29/2017   Procedure: BIOPSY;  Surgeon: Daneil Dolin, MD;  [facility];  Service: Endoscopy;;  duodenal biopsies  . BUNIONECTOMY Bilateral   . COLONOSCOPY  2008   Dr. Oneida Alar: rare sigmoid colon diverticulosis, internal hemorrhoids.   . COLONOSCOPY WITH PROPOFOL N/A 12/17/2016   dense left-sided diverticulosis, right colon ulcers s/p biopsy query occult NSAID use vs transient ischemia, not consistent with IBD. CMV stains negative.   Marland Kitchen ENDARTERECTOMY FEMORAL Right 08/14/2017   Procedure: RIGHT ILLIO-FEMORAL ENDARTERECTOMY;  Surgeon: Serafina Mitchell, MD;  Location: [facility] OR;  Service: Vascular;  Laterality: Right;  . ESOPHAGEAL DILATION  12/17/2016   EGD with mild Schatzki's ring s/p dilatation, small hiatal hernia, erosive gastropathy (negative H.pylori gastritis)  . ESOPHAGOGASTRODUODENOSCOPY  2008   Dr. Oneida Alar: normal esophagus without Barrett's, antritis and duodenitis, path with H.pylori gastritis  . ESOPHAGOGASTRODUODENOSCOPY (EGD) WITH PROPOFOL N/A 12/17/2016   Procedure: ESOPHAGOGASTRODUODENOSCOPY (EGD) WITH PROPOFOL;  Surgeon: Daneil Dolin, MD;  [facility];  Service:  Gastroenterology;  Laterality: N/A;  . ESOPHAGOGASTRODUODENOSCOPY (EGD) WITH PROPOFOL N/A 04/29/2017   Patchy erythema of gastric mucosa diffusely, extensive inflammatory changes in duodenum, geographic ulceration and mucosal edema present, encroaching somewhat on the lumen yet still widely patent, distal second portion of duodenum appeared abnormal, path with peptic duodenitis with ulceration  . GROIN DEBRIDEMENT Right 10/14/2017   Procedure: RIGHT GROIN AND RIGHT LOWER QUADRANT ABDOMEN DEBRIDEMENT WITH PLACEMENT OF ANTIBIOTIC BEADS;  Surgeon: Serafina Mitchell, MD;  Location: [place];  Service: Vascular;  Laterality: Right;  . HEMATOMA EVACUATION Left 12/06/2017   Procedure: EVACUATION HEMATOMA;  Surgeon: Waynetta [place], MD;  Location: [place];  Service: Vascular;  Laterality: Left;  . HERNIA REPAIR  4098   umbilical  . I & D EXTREMITY Right 09/21/2017   Procedure: IRRIGATION AND DEBRIDEMENT GROIN;  Surgeon: Elam Dutch, MD;  Location: [place];  Service: Vascular;  Laterality: Right;  . I & D EXTREMITY Left 11/23/2017   Procedure: Evacuation Hematoma LEFT UPPER ARM GRAFT;  Surgeon: Elam Dutch, MD;  Location: [place];  Service: Vascular;  Laterality: Left;  . INGUINAL HERNIA REPAIR Right 02/2017   [place]  . INSERTION OF DIALYSIS CATHETER Right 09/04/2017   Procedure: INSERTION OF TUNNELED  DIALYSIS CATHETER - RIGHT INTERNAL  JUGULAR PLACEMENT;  Surgeon: Serafina Mitchell, MD;  Location: [place] [facility] OR;  Service: Vascular;  Laterality: Right;  . INSERTION OF DIALYSIS CATHETER Right 07/26/2018   Procedure: INSERTION OF DIALYSIS CATHETER RIGHT INTERNAL JUGULAR;  Surgeon: Marty Heck, MD;  Location: [place];  Service: Vascular;  Laterality: Right;  . INSERTION OF ILIAC STENT Right 08/14/2017   Procedure: INSERTION OF RIGHT COMMON ILIAC STENT 52mm x 19mm x 130cm INSERTION OF RIGHT EXTERNAL ILIAC STENT 2mm x 74mm x 130cm INSERTION OF SUPERFICIAL FERMORAL ARTERY STENT 55mm x 49mm x 130cm;  Surgeon:  Serafina Mitchell, MD;  Location: [facility] OR;  Service: Vascular;  Laterality: Right;  . IR FLUORO GUIDE CV LINE RIGHT  08/31/2017  . IR REMOVAL TUN CV CATH W/O FL  05/28/2018  . IR REMOVAL TUN CV CATH W/O FL  03/07/2019  . IR US GUIDE VASC ACCESS RIGHT  08/31/2017  . LOWER EXTREMITY ANGIOGRAPHY Right 08/11/2017   Procedure: LOWER EXTREMITY ANGIOGRAPHY;  Surgeon: Serafina Mitchell, MD;  Location: [place] CV LAB;  Service: Cardiovascular;  Laterality: Right;  . PATCH ANGIOPLASTY Right 08/14/2017   Procedure: PATCH ANGIOPLASTY USING HEMASHIELD PATCH 0.3IN Lillie [place];  Surgeon: Serafina Mitchell, MD;  [facility];  Service: Vascular;  Laterality: Right;  . PERIPHERAL VASCULAR CATHETERIZATION N/A 09/20/2014   Procedure: Abdominal Aortogram;  Surgeon: Serafina Mitchell, MD;  Location: [place] [facility] LAB;  Service: Cardiovascular;  Laterality: N/A;  . POSTERIOR FUSION LUMBAR SPINE    . TEE WITHOUT CARDIOVERSION N/A 01/12/2018   Procedure: TRANSESOPHAGEAL ECHOCARDIOGRAM (TEE);  Surgeon: Lelon Perla, MD;  Location: [facility] ENDOSCOPY;  Service: Cardiovascular;  Laterality: N/A;     reports that he has been smoking cigarettes. He has a 11.75 pack-year smoking history. He has never used smokeless tobacco. He reports previous alcohol use. He reports previous drug use.  No Known Allergies  Family History  Problem Relation Age of Onset  . Alcoholism Mother   . Heart disease Father        Massive heart attack  . Heart attack Father   . Atrial fibrillation Father   . Colon cancer Father   . Colon cancer Maternal Grandfather 64  . Alcoholism Maternal Grandfather   . Renal cancer Cousin   . Ovarian cancer Sister     Unacceptable: Noncontributory, unremarkable, or negative. Acceptable: (example)Family history negative for heart disease  Prior to Admission medications   Medication Sig Start Date End Date Taking? Authorizing Provider  albuterol (PROVENTIL HFA;VENTOLIN HFA) 108 (90 Base) MCG/ACT inhaler Inhale 2  puffs into the lungs every 6 (six) hours as needed for wheezing or shortness of breath.   Yes [provider]  albuterol (PROVENTIL) (2.5 MG/3ML) 0.083% nebulizer solution Inhale 3 mLs into the lungs 3 (three) times daily as needed for shortness of breath or wheezing. 06/23/18  Yes [provider]  apixaban (ELIQUIS) 5 MG TABS tablet Take 1 tablet (5 mg total) by mouth 2 (two) times daily. 05/28/19   Geradine Girt, DO  atorvastatin (LIPITOR) 80 MG tablet Take 80 mg by mouth daily. 04/25/19   [provider]  BREO ELLIPTA 200-25 MCG/INH AEPB Inhale 1 puff into the lungs daily as needed (for respiratory issues.).  07/27/17   [provider]  calcium acetate (PHOSLO) 667 MG capsule Take 667 mg by mouth 3 (three) times daily with meals. 06/29/18   [provider]  carvedilol (COREG) 25 MG tablet Take 25 mg by mouth 2 (two)  times daily. 04/25/19   [provider]  CHANTIX 0.5 MG tablet Take 0.5 mg by mouth daily. 04/13/19   [provider]  Cholecalciferol (VITAMIN D3) 50 MCG (2000 UT) TABS Take 2,000 Units by mouth daily. 06/28/18   [provider]  clopidogrel (PLAVIX) 75 MG tablet Take 75 mg by mouth daily. 06/28/18   [provider]  diltiazem (CARDIZEM) 60 MG tablet Take 1 tablet (60 mg total) by mouth every 6 (six) hours. 05/28/19   Geradine Girt, DO  gabapentin (NEURONTIN) 300 MG capsule Take 300 mg by mouth 2 (two) times daily. 07/07/18   [provider]  hydrALAZINE (APRESOLINE) 25 MG tablet Take 1 tablet (25 mg total) by mouth every 8 (eight) hours. 05/28/19   Geradine Girt, DO  isosorbide mononitrate (IMDUR) 60 MG 24 hr tablet Take 60 mg by mouth daily. 07/07/18   [provider]  oxyCODONE-acetaminophen (PERCOCET) 10-325 MG tablet Take 1 tablet by mouth 5 (five) times daily as needed for pain.     [provider]  pantoprazole (PROTONIX) 40 MG tablet Take 1 tablet (40 mg total) by mouth 2 (two)  times daily. Patient taking differently: Take 40 mg by mouth daily.  04/30/17 01/27/26  Manuella Ghazi, Pratik D, DO  predniSONE (DELTASONE) 20 MG tablet 40 mg x 3 more days with breakfast 05/29/19   Eulogio Bear U, DO  sodium bicarbonate 650 MG tablet Take 1 tablet (650 mg total) by mouth daily. Patient taking differently: Take 650 mg by mouth 3 (three) times daily.  04/04/18   Orson Eva, MD    Physical Exam: Vitals:   08/22/19 2316 08/22/19 2325 08/22/19 2330 08/23/19 0000  BP:   118/74 (!) 140/98  Pulse:      Resp:   17 18  Temp:      TempSrc:      SpO2: 92% 92%    Weight:      Height:        Constitutional: NAD, calm, comfortable Vitals:   08/22/19 2316 08/22/19 2325 08/22/19 2330 08/23/19 0000  BP:   118/74 (!) 140/98  Pulse:      Resp:   17 18  Temp:      TempSrc:      SpO2: 92% 92%    Weight:      Height:        General: Patient is a 64 year old male, laying comfortably in the bed having some wheezing but saturating well on room air. Eyes: PERRL, lids and conjunctivae normal ENMT: Mucous membranes are moist. Posterior pharynx clear of any exudate or lesions.Normal dentition.  Neck: normal, supple, no masses, no thyromegaly Respiratory: Diminished breath sounds in bilateral lower lobes with diffuse bilateral wheezing on expiration.  No crackles or rhonchi appreciated on auscultation. Normal respiratory effort. No accessory muscle use.  Cardiovascular: Irregularly irregular rhythm with tachycardia, no murmurs / rubs / gallops.  2+ bilateral lower extremity edema. 2+ pedal pulses. No carotid bruits.  Abdomen: Soft, distended but no tenderness, no masses palpated. No hepatosplenomegaly. Bowel sounds positive.  Peritoneal dialysis catheter present in right lower abdominal quadrant. Musculoskeletal: no clubbing / cyanosis. No joint deformity upper and lower extremities. Good ROM, no contractures. Normal muscle tone.  Left great toe is ecchymotic and tender to touch. Skin: no rashes,  lesions, ulcers. No induration Neurologic: CN 2-12 grossly intact. Sensation intact, DTR normal. Strength 5/5 in all 4.  Psychiatric: Normal judgment and insight. Alert and oriented x 3. Normal mood.   (  Anything < 9 systems with 2 bullets each down codes to level 1) (If patient refuses exam can't bill higher level) (Make sure to document decubitus ulcers present on admission -- if possible -- and whether patient has chronic indwelling catheter at time of admission)  Labs on Admission: I have personally reviewed following labs and imaging studies  CBC: Recent Labs  Lab 08/22/19 1930  WBC 10.8*  HGB 12.0*  HCT 37.1*  MCV 99.7  PLT 465   Basic Metabolic Panel: Recent Labs  Lab 08/22/19 1930  NA 136  K 3.8  CL 100  CO2 21*  GLUCOSE 98  BUN 58*  CREATININE 6.79*  CALCIUM 7.5*  MG 1.7   GFR: Estimated Creatinine Clearance: 13.6 mL/min (A) (by C-G formula based on SCr of 6.79 mg/dL (H)). Liver Function Tests: No results for input(s): AST, ALT, ALKPHOS, BILITOT, PROT, ALBUMIN in the last 168 hours. No results for input(s): LIPASE, AMYLASE in the last 168 hours. No results for input(s): AMMONIA in the last 168 hours. Coagulation Profile: No results for input(s): INR, PROTIME in the last 168 hours. Cardiac Enzymes: No results for input(s): CKTOTAL, CKMB, CKMBINDEX, TROPONINI in the last 168 hours. BNP (last 3 results) No results for input(s): PROBNP in the last 8760 hours. HbA1C: No results for input(s): HGBA1C in the last 72 hours. CBG: No results for input(s): GLUCAP in the last 168 hours. Lipid Profile: No results for input(s): CHOL, HDL, LDLCALC, TRIG, CHOLHDL, LDLDIRECT in the last 72 hours. Thyroid Function Tests: No results for input(s): TSH, T4TOTAL, FREET4, T3FREE, THYROIDAB in the last 72 hours. Anemia Panel: No results for input(s): VITAMINB12, FOLATE, FERRITIN, TIBC, IRON, RETICCTPCT in the last 72 hours. Urine analysis:    Component Value Date/Time    COLORURINE YELLOW 05/27/2019 0524   APPEARANCEUR CLEAR 05/27/2019 0524   LABSPEC 1.025 05/27/2019 0524   PHURINE 6.0 05/27/2019 0524   GLUCOSEU 150 (A) 05/27/2019 0524   HGBUR MODERATE (A) 05/27/2019 0524   BILIRUBINUR NEGATIVE 05/27/2019 0524   KETONESUR NEGATIVE 05/27/2019 0524   PROTEINUR 100 (A) 05/27/2019 0524   UROBILINOGEN 0.2 03/03/2010 1056   NITRITE NEGATIVE 05/27/2019 0524   LEUKOCYTESUR TRACE (A) 05/27/2019 0524    Radiological Exams on Admission: DG Chest Port 1 View  Result Date: 08/22/2019 CLINICAL DATA:  Shortness of breath congestion EXAM: PORTABLE CHEST 1 VIEW COMPARISON:  May 26, 2019 FINDINGS: There is unchanged cardiomegaly. Aortic knob calcifications are seen. There is hyperinflation of the upper lung zones. Ill-defined interstitial reticulonodular opacity seen at the right lung base, more pronounced than on the prior exam. No pleural effusion is seen. No acute osseous abnormality. IMPRESSION: Interstitial reticulonodular opacity more pronounced at the right lung base than on the prior exam which could be due to atelectasis and/or infectious etiology. Findings suggestive of underlying COPD. Electronically Signed   By: Prudencio Pair M.D.   On: 08/22/2019 19:41   DG Foot Complete Left  Result Date: 08/22/2019 CLINICAL DATA:  [place], great toe pain EXAM: LEFT FOOT - COMPLETE 3+ VIEW COMPARISON:  12/23/2017 FINDINGS: Frontal, oblique, and lateral views of the left foot are obtained. Chronic posttraumatic/postsurgical changes at the first metatarsal. Prior healed second metatarsal fracture unchanged. 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 Diffuse hammertoe deformities are noted. There are no acute displaced fractures. Prominent dorsal soft tissue swelling of the foot. Diffuse vascular calcifications. IMPRESSION: 1. Chronic postsurgical/posttraumatic changes of the first and second metatarsal. 2. Stable severe first metatarsophalangeal joint  osteoarthritis.  3. Dorsal soft tissue swelling. 4. No acute fracture. Electronically Signed   By: Randa Ngo M.D.   On: 08/22/2019 19:41    EKG:   Assessment/Plan Principal Problem:   Exertional dyspnea Exertional dyspnea is multifactorial due to COPD exacerbation, acute CHF exacerbation and fluid overload in the setting of ESRD. IV Lasix could not be given because patient is not making any urine at all.  Nebulization and azithromycin given for COPD exacerbation.  Patient is started on diltiazem for atrial fibrillation with RVR.  Oxygen saturation is within normal limits at rest.  Active Problems: COPD with acute exacerbation DuoNeb scheduled and as needed ordered. Oxygen saturation with nasal cannula as needed to maintain oxygen saturation above 88%. Azithromycin 500 mg p.o. ordered  Acute CHF exacerbation BNP is greater than 1500 and there is bilateral lower extremity swelling. Last echocardiogram was done and February 2021 and it showed normal ejection fraction and indeterminate left ventricular diastolic parameters. IV Lasix could not be given because patient is not making any urine at all. Nephrology consult ordered and they will see the patient tomorrow.  Atrial fibrillation with RVR Patient was complaining of palpitation and EKG showed atrial fibrillation with ventricular rate of 141.  Patient is given Cardizem bolus followed by Cardizem drip. Continue Cardizem drip. Hold home atrial fibrillation medications at this time and will restart them once rapid ventricular rate is resolved. Continue home Eliquis  ESRD on dialysis Patient is on peritoneal dialysis and states that he is compliant with his dialysis but still fluid accumulation in his belly and bilateral lower extremities.  ED physician contacted Dr. Patel/nephrology and he recommended to admit the patient to [facility].  Dr. Posey Pronto will be informed once the patient reaches [facility].    Essential hypertension Blood  pressure is within normal limits.  Continue home hydralazine    (please populate well all problems here in Problem List. (For example, if patient is on BP meds at home and you resume or decide to hold them, it is a problem that needs to be her. Same for CAD, COPD, HLD and so on)     DVT prophylaxis: Eliquis 5 mg twice daily Code Status: Full code Family Communication: No family member present at the bedside Disposition Plan: Patient will be discharged to home Consults called: Dr. Patel/nephrology contacted by ED physician. Admission status: Inpatient/progressive care   Edmonia Lynch MD [facility] Pager [phone]   If 7PM-7AM, please contact night-coverage [email or web address] Password [email or web address]  08/23/2019, 1:24 AM

## 2019-08-23 NOTE — ED Notes (Signed)
This AC called due to PIV infiltrated in left wrist. PIV placed by this RN into the right forearm. Patent. Flushed and cardizem restarted. Left wrist PIV removed and primary RN notified, Othelia Pulling.

## 2019-08-23 NOTE — Consult Note (Signed)
[facility] Renal Consultation Note  Requesting MD: Heath Lark, DO Indication for Consultation: End-stage renal disease  Chief complaint: Shortness of breath and weakness  HPI:  Aaron [name] is a 64 y.o. male with a history including hypertension, A. fib, and end-stage renal disease on peritoneal dialysis who presented to the ER with worsening shortness of breath.  He states that he has had swelling of the legs for the last couple of days.  He states that he has also had some wheezing.  He was also concerned that he felt weak and his heart was beating very fast.  He had a Covid screen here that was negative.  He states that he is on dialysis nightly and is compliant with his prescription.  He states that he normally uses 2 green bags and one purple every night. (icodextran?).  He is unable to tell me his fill volumes.  He goes to [facility] and previously charted [facility].  He states that he does make urine and voids about 3 times a day.  Heart rate noted at 150 on arrival and A. fib with RVR.  He received Cardizem.  PMHx:   Past Medical History:  Diagnosis Date  . Anxiety   . Arthritis    knees , back , shoulders (10/12/2017)  . Atrial fibrillation ([place])   . CAD (coronary artery disease)    Mild nonobstructive disease at cardiac catheterization 2002  . Chronic back pain    "back of my neck; down thru my legs" (10/12/2017)  . COPD (chronic obstructive pulmonary disease) ([place])   . Diverticulitis   . Esophageal reflux   . ESRD (end stage renal disease) on dialysis [facility])    "TTS; [place]" (11/23/2017)  . Essential hypertension   . Hepatitis C    states he no longer has this  . History of kidney stones   . History of syncope   . Hyperlipidemia   . Jerking 09/23/2014  . Myocardial infarction ([place]) 02/2017   "light one" (10/12/2017)  . Non-compliant behavior    non compliant with diaylsis per daughter  . PAT (paroxysmal atrial tachycardia) ([place])   . Peripheral arterial disease  ([place])    Occluded left superficial femoral artery status post stent June 2016 - Dr. Trula Slade  . Pneumonia 1961  . Polycystic kidney, unspecified type   . Syncope 09/2014  . SYNCOPE 05/07/2010   Qualifier: Diagnosis of  By: Laurance Flatten RN, BSN, Anderson Malta      Past Surgical History:  Procedure Laterality Date  . ABDOMINAL AORTOGRAM N/A 08/11/2017   Procedure: ABDOMINAL AORTOGRAM;  Surgeon: Serafina Mitchell, MD;  Location: [place] CV LAB;  Service: Cardiovascular;  Laterality: N/A;  . ANTERIOR CERVICAL DECOMP/DISCECTOMY FUSION    . APPLICATION OF WOUND VAC Right 08/14/2017   Procedure: APPLICATION OF PREVENA INCISIONAL WOUND VAC RIGHT GROIN;  Surgeon: Serafina Mitchell, MD;  Location: [facility] OR;  Service: Vascular;  Laterality: Right;  . AV FISTULA PLACEMENT Left 09/04/2017   Procedure: ARTERIOVENOUS (AV) FISTULA CREATION LEFT UPPER ARM;  Surgeon: Serafina Mitchell, MD;  Location: [place];  Service: Vascular;  Laterality: Left;  . BACK SURGERY    . BASCILIC VEIN TRANSPOSITION Left 11/20/2017   Procedure: SECOND STAGE BASILIC VEIN TRANSPOSITION LEFT ARM;  Surgeon: Serafina Mitchell, MD;  Location: [place];  Service: Vascular;  Laterality: Left;  . BIOPSY  12/17/2016   Procedure: BIOPSY;  Surgeon: Daneil Dolin, MD;  [facility];  Service: Gastroenterology;;  gastric colon  . BIOPSY  04/29/2017   Procedure: BIOPSY;  Surgeon: Daneil Dolin, MD;  [facility];  Service: Endoscopy;;  duodenal biopsies  . BUNIONECTOMY Bilateral   . COLONOSCOPY  2008   Dr. Oneida Alar: rare sigmoid colon diverticulosis, internal hemorrhoids.   . COLONOSCOPY WITH PROPOFOL N/A 12/17/2016   dense left-sided diverticulosis, right colon ulcers s/p biopsy query occult NSAID use vs transient ischemia, not consistent with IBD. CMV stains negative.   Marland Kitchen ENDARTERECTOMY FEMORAL Right 08/14/2017   Procedure: RIGHT ILLIO-FEMORAL ENDARTERECTOMY;  Surgeon: Serafina Mitchell, MD;  Location: [facility] OR;  Service: Vascular;  Laterality:  Right;  . ESOPHAGEAL DILATION  12/17/2016   EGD with mild Schatzki's ring s/p dilatation, small hiatal hernia, erosive gastropathy (negative H.pylori gastritis)  . ESOPHAGOGASTRODUODENOSCOPY  2008   Dr. Oneida Alar: normal esophagus without Barrett's, antritis and duodenitis, path with H.pylori gastritis  . ESOPHAGOGASTRODUODENOSCOPY (EGD) WITH PROPOFOL N/A 12/17/2016   Procedure: ESOPHAGOGASTRODUODENOSCOPY (EGD) WITH PROPOFOL;  Surgeon: Daneil Dolin, MD;  [facility];  Service: Gastroenterology;  Laterality: N/A;  . ESOPHAGOGASTRODUODENOSCOPY (EGD) WITH PROPOFOL N/A 04/29/2017   Patchy erythema of gastric mucosa diffusely, extensive inflammatory changes in duodenum, geographic ulceration and mucosal edema present, encroaching somewhat on the lumen yet still widely patent, distal second portion of duodenum appeared abnormal, path with peptic duodenitis with ulceration  . GROIN DEBRIDEMENT Right 10/14/2017   Procedure: RIGHT GROIN AND RIGHT LOWER QUADRANT ABDOMEN DEBRIDEMENT WITH PLACEMENT OF ANTIBIOTIC BEADS;  Surgeon: Serafina Mitchell, MD;  Location: [place];  Service: Vascular;  Laterality: Right;  . HEMATOMA EVACUATION Left 12/06/2017   Procedure: EVACUATION HEMATOMA;  Surgeon: Waynetta Sandy, MD;  Location: [place];  Service: Vascular;  Laterality: Left;  . HERNIA REPAIR  2297   umbilical  . I & D EXTREMITY Right 09/21/2017   Procedure: IRRIGATION AND DEBRIDEMENT GROIN;  Surgeon: Elam Dutch, MD;  Location: [place];  Service: Vascular;  Laterality: Right;  . I & D EXTREMITY Left 11/23/2017   Procedure: Evacuation Hematoma LEFT UPPER ARM GRAFT;  Surgeon: Elam Dutch, MD;  Location: [place];  Service: Vascular;  Laterality: Left;  . INGUINAL HERNIA REPAIR Right 02/2017   [place]  . INSERTION OF DIALYSIS CATHETER Right 09/04/2017   Procedure: INSERTION OF TUNNELED  DIALYSIS CATHETER - RIGHT INTERNAL JUGULAR PLACEMENT;  Surgeon: Serafina Mitchell, MD;  Location: [place];  Service:  Vascular;  Laterality: Right;  . INSERTION OF DIALYSIS CATHETER Right 07/26/2018   Procedure: INSERTION OF DIALYSIS CATHETER RIGHT INTERNAL JUGULAR;  Surgeon: Marty Heck, MD;  Location: [place];  Service: Vascular;  Laterality: Right;  . INSERTION OF ILIAC STENT Right 08/14/2017   Procedure: INSERTION OF RIGHT COMMON ILIAC STENT 60mm x 36mm x 130cm INSERTION OF RIGHT EXTERNAL ILIAC STENT 68mm x 41mm x 130cm INSERTION OF SUPERFICIAL FERMORAL ARTERY STENT 21mm x 73mm x 130cm;  Surgeon: Serafina Mitchell, MD;  Location: [facility] OR;  Service: Vascular;  Laterality: Right;  . IR FLUORO GUIDE CV LINE RIGHT  08/31/2017  . IR REMOVAL TUN CV CATH W/O FL  05/28/2018  . IR REMOVAL TUN CV CATH W/O FL  03/07/2019  . IR US GUIDE VASC ACCESS RIGHT  08/31/2017  . LOWER EXTREMITY ANGIOGRAPHY Right 08/11/2017   Procedure: LOWER EXTREMITY ANGIOGRAPHY;  Surgeon: Serafina Mitchell, MD;  Location: [place] CV LAB;  Service: Cardiovascular;  Laterality: Right;  . PATCH ANGIOPLASTY Right 08/14/2017   Procedure: PATCH ANGIOPLASTY USING  HEMASHIELD PATCH 0.3IN Lillie Columbia;  Surgeon: Serafina Mitchell, MD;  [facility];  Service: Vascular;  Laterality: Right;  . PERIPHERAL VASCULAR CATHETERIZATION N/A 09/20/2014   Procedure: Abdominal Aortogram;  Surgeon: Serafina Mitchell, MD;  Location: [place] CV LAB;  Service: Cardiovascular;  Laterality: N/A;  . POSTERIOR FUSION LUMBAR SPINE    . TEE WITHOUT CARDIOVERSION N/A 01/12/2018   Procedure: TRANSESOPHAGEAL ECHOCARDIOGRAM (TEE);  Surgeon: Lelon Perla, MD;  Location: [facility] ENDOSCOPY;  Service: Cardiovascular;  Laterality: N/A;    Family Hx: 2 paternal cousins on dialysis  Family History  Problem Relation Age of Onset  . Alcoholism Mother   . Heart disease Father        Massive heart attack  . Heart attack Father   . Atrial fibrillation Father   . Colon cancer Father   . Colon cancer Maternal Grandfather 51  . Alcoholism Maternal Grandfather   . Renal cancer Cousin   . Ovarian  cancer Sister     Social History: He states that he quit smoking in January of this year after smoking for several years.  He states that he quit drinking 4 years ago.  Allergies: No Known Allergies  Medications: The medications below are on file.  He has a bag of medications with him which only contains the following medications and he states he takes these along with pain meds.    Meds in bag:  Coreg 25 mg twice daily Gabapentin 300 mg twice daily Diltiazem 60 mg every 6 hours Pantoprazole 40 mg daily Hydralazine 25 mg 3 times a day  Prior to Admission medications   Medication Sig Start Date End Date Taking? Authorizing Provider  albuterol (PROVENTIL HFA;VENTOLIN HFA) 108 (90 Base) MCG/ACT inhaler Inhale 2 puffs into the lungs every 6 (six) hours as needed for wheezing or shortness of breath.   Yes [provider]  albuterol (PROVENTIL) (2.5 MG/3ML) 0.083% nebulizer solution Inhale 3 mLs into the lungs 3 (three) times daily as needed for shortness of breath or wheezing. 06/23/18  Yes [provider]  apixaban (ELIQUIS) 5 MG TABS tablet Take 1 tablet (5 mg total) by mouth 2 (two) times daily. 05/28/19   Geradine Girt, DO  atorvastatin (LIPITOR) 80 MG tablet Take 80 mg by mouth daily. 04/25/19   [provider]  BREO ELLIPTA 200-25 MCG/INH AEPB Inhale 1 puff into the lungs daily as needed (for respiratory issues.).  07/27/17   [provider]  calcium acetate (PHOSLO) 667 MG capsule Take 667 mg by mouth 3 (three) times daily with meals. 06/29/18   [provider]  carvedilol (COREG) 25 MG tablet Take 25 mg by mouth 2 (two) times daily. 04/25/19   [provider]  CHANTIX 0.5 MG tablet Take 0.5 mg by mouth daily. 04/13/19   [provider]  Cholecalciferol (VITAMIN D3) 50 MCG (2000 UT) TABS Take 2,000 Units by mouth daily. 06/28/18   [provider]  clopidogrel (PLAVIX) 75 MG tablet Take 75 mg by mouth daily. 06/28/18    [provider]  diltiazem (CARDIZEM) 60 MG tablet Take 1 tablet (60 mg total) by mouth every 6 (six) hours. 05/28/19   Geradine Girt, DO  gabapentin (NEURONTIN) 300 MG capsule Take 300 mg by mouth 2 (two) times daily. 07/07/18   [provider]  hydrALAZINE (APRESOLINE) 25 MG tablet Take 1 tablet (25 mg total) by mouth every 8 (eight) hours. 05/28/19   Geradine Girt, DO  isosorbide  mononitrate (IMDUR) 60 MG 24 hr tablet Take 60 mg by mouth daily. 07/07/18   [provider]  oxyCODONE-acetaminophen (PERCOCET) 10-325 MG tablet Take 1 tablet by mouth 5 (five) times daily as needed for pain.     [provider]  pantoprazole (PROTONIX) 40 MG tablet Take 1 tablet (40 mg total) by mouth 2 (two) times daily. Patient taking differently: Take 40 mg by mouth daily.  04/30/17 01/27/26  Manuella Ghazi, Pratik D, DO  predniSONE (DELTASONE) 20 MG tablet 40 mg x 3 more days with breakfast 05/29/19   Eulogio Bear U, DO  sodium bicarbonate 650 MG tablet Take 1 tablet (650 mg total) by mouth daily. Patient taking differently: Take 650 mg by mouth 3 (three) times daily.  04/04/18   Orson Eva, MD    I have reviewed the patient's current and reported prior to admission medications.  Note discrepancies above.  Labs:  BMP Latest Ref Rng & Units 08/23/2019 08/22/2019 05/27/2019  Glucose 70 - 99 mg/dL 131(H) 98 195(H)  BUN 8 - 23 mg/dL 57(H) 58(H) 39(H)  Creatinine 0.61 - 1.24 mg/dL 6.87(H) 6.79(H) 6.79(H)  Sodium 135 - 145 mmol/L 137 136 136  Potassium 3.5 - 5.1 mmol/L 3.9 3.8 3.7  Chloride 98 - 111 mmol/L 100 100 101  CO2 22 - 32 mmol/L 23 21(L) 23  Calcium 8.9 - 10.3 mg/dL 7.3(L) 7.5(L) 7.5(L)    ROS:  Pertinent items noted in HPI and remainder of comprehensive ROS otherwise negative.  Physical Exam: Vitals:   08/23/19 0700 08/23/19 0851  BP: (!) 121/93   Pulse:    Resp: 16   Temp:    SpO2:  99%     General: Adult male in stretcher HEENT: Normocephalic atraumatic Eyes:  Extraocular movements intact sclera Neck: Supple trachea midline  Heart: S1-S2 no rub appreciated tachycardic to 103 on my exam blood pressure 134/82 Lungs: Basilar crackles with wheezing noted.  On room air. Abdomen: Distended abdomen nontender on exam bowel sounds Extremities: 1+ edema appreciated no cyanosis or clubbing Bike no anxiety or agitation Neuro: Alert and oriented x3 provides a history and follows commands  Assessment/Plan:  # End-stage renal disease on PD - Needs to transfer to [facility] as unable to do PD at [facility].  Team is aware and they are working on this - there is a Event organiser of beds - Lasix 80 mg IV once now and again at 5pm - Will need PD once at [facility] - placing orders and contacting Unit as well as nephrology for [facility] - would provide a maximum of gabapentin 300 mg daily given ESRD status - Agree with renal diet and he is okay with me tossing his orange juice from the tray - Would not provide his listed home bicarbonate  # Acute on chronic diastolic CHF -EF normal last check in 05/2019  - Lasix as above and for PD once available at [facility] - He is on room air on my exam    # A. fib with RVR - HR 103 on my exam  - Per primary team  # Anemia of CKD  - no acute indication for ESA  - Hb at goal now  # metabolic bone disease  - add on phos to his previous labs; renal panel in AM (unsure if order will transfer to [facility]) - Can continue binders for now   # COPD - Nebs and saturation goal per primary team.  Congratulated pt on stopping smoking earlier this year  Marlane Hatcher  Denise Washburn 08/23/2019, 11:16 AM

## 2019-08-24 LAB — RENAL FUNCTION PANEL
Albumin: 2.2 g/dL — ABNORMAL LOW (ref 3.5–5.0)
Anion gap: 13 (ref 5–15)
BUN: 55 mg/dL — ABNORMAL HIGH (ref 8–23)
CO2: 25 mmol/L (ref 22–32)
Calcium: 8 mg/dL — ABNORMAL LOW (ref 8.9–10.3)
Chloride: 99 mmol/L (ref 98–111)
Creatinine, Ser: 6.66 mg/dL — ABNORMAL HIGH (ref 0.61–1.24)
GFR calc Af Amer: 9 mL/min — ABNORMAL LOW (ref >60)
GFR calc non Af Amer: 8 mL/min — ABNORMAL LOW (ref >60)
Glucose, Bld: 167 mg/dL — ABNORMAL HIGH (ref 70–99)
Phosphorus: 6.2 mg/dL — ABNORMAL HIGH (ref 2.5–4.6)
Potassium: 3.4 mmol/L — ABNORMAL LOW (ref 3.5–5.1)
Sodium: 137 mmol/L (ref 135–145)

## 2019-08-24 LAB — CBC
HCT: 37.6 % — ABNORMAL LOW (ref 39.0–52.0)
Hemoglobin: 12.1 g/dL — ABNORMAL LOW (ref 13.0–17.0)
MCH: 31.9 pg (ref 26.0–34.0)
MCHC: 32.2 g/dL (ref 30.0–36.0)
MCV: 99.2 fL (ref 80.0–100.0)
Platelets: 266 10*3/uL (ref 150–400)
RBC: 3.79 MIL/uL — ABNORMAL LOW (ref 4.22–5.81)
RDW: 14.5 % (ref 11.5–15.5)
WBC: 13.5 10*3/uL — ABNORMAL HIGH (ref 4.0–10.5)
nRBC: 0 % (ref 0.0–0.2)

## 2019-08-24 MED ORDER — POTASSIUM CHLORIDE CRYS ER 20 MEQ PO TBCR
40.0000 meq | EXTENDED_RELEASE_TABLET | Freq: Once | ORAL | Status: AC
  Administered 2019-08-24: 40 meq via ORAL
  Filled 2019-08-24: qty 2

## 2019-08-24 MED ORDER — APIXABAN 5 MG PO TABS
5.0000 mg | ORAL_TABLET | Freq: Two times a day (BID) | ORAL | Status: DC
  Administered 2019-08-24 – 2019-08-25 (×2): 5 mg via ORAL
  Filled 2019-08-24 (×2): qty 1

## 2019-08-24 MED ORDER — IPRATROPIUM-ALBUTEROL 0.5-2.5 (3) MG/3ML IN SOLN
3.0000 mL | Freq: Two times a day (BID) | RESPIRATORY_TRACT | Status: DC
  Administered 2019-08-24 – 2019-09-11 (×32): 3 mL via RESPIRATORY_TRACT
  Filled 2019-08-24 (×32): qty 3

## 2019-08-24 NOTE — Progress Notes (Signed)
Subjective:  PD completed overnight-  uf 1257-  He says breathing is better  Objective Vital signs in last 24 hours: Vitals:   08/24/19 0745 08/24/19 0818 08/24/19 0953 08/24/19 1039  BP: (!) 141/91  (!) 140/98 (!) 150/105  Pulse: 62  91 (!) 105  Resp: 18   18  Temp:    97.6 F (36.4 C)  TempSrc:    Oral  SpO2: 100% 98%  98%  Weight:    90 kg  Height:       Weight change: 4.763 kg  Intake/Output Summary (Last 24 hours) at 08/24/2019 1127 Last data filed at 08/24/2019 0546 Gross per 24 hour  Intake 363.25 ml  Output --  Net 363.25 ml    Assessment/ Plan: Pt is a 64 y.o. yo male with ESRD on PD who was admitted on 08/22/2019 with SOB-  Felt to have COPD exacerbation/Afib with rapid rate and possibly some volume overload   Assessment/Plan: 1. SOB-  Felt to be multifactorial - COPD/  Afib with rapid rate and volume overload.  He is clinically improved with measures so far-  Steroids/inhalers/azithro.  He thinks that he may need O2 as OP  2. ESRD -  Has been on PD for 6 mos. followed in [place].  Albumin is low which can be problematic in PD.  Using 4.25% PD fluid for max UF 3. Anemia- not an issue right now 4. Secondary hyperparathyroidism - calc OK-  Phos slightly elevated - no binder on med list 5. HTN/volume-  Plenty of BP to work with -  On coreg and dilt for Afib.  Attempting generous UF.  If UF brings BP down would have a low threshold to stop hydralazine 6.  Will give dose of potassium today   Aaron [name]    Labs: Basic Metabolic Panel: Recent Labs  Lab 08/22/19 1930 08/23/19 0348 08/24/19 0908  NA 136 137 137  K 3.8 3.9 3.4*  CL 100 100 99  CO2 21* 23 25  GLUCOSE 98 131* 167*  BUN 58* 57* 55*  CREATININE 6.79* 6.87* 6.66*  CALCIUM 7.5* 7.3* 8.0*  PHOS  --  7.1* 6.2*   Liver Function Tests: Recent Labs  Lab 08/24/19 0908  ALBUMIN 2.2*   No results for input(s): LIPASE, AMYLASE in the last 168 hours. No results for input(s): AMMONIA in the  last 168 hours. CBC: Recent Labs  Lab 08/22/19 1930 08/23/19 0348 08/24/19 0908  WBC 10.8* 9.9 13.5*  HGB 12.0* 11.5* 12.1*  HCT 37.1* 36.3* 37.6*  MCV 99.7 100.3* 99.2  PLT 210 201 266   Cardiac Enzymes: No results for input(s): CKTOTAL, CKMB, CKMBINDEX, TROPONINI in the last 168 hours. CBG: No results for input(s): GLUCAP in the last 168 hours.  Iron Studies: No results for input(s): IRON, TIBC, TRANSFERRIN, FERRITIN in the last 72 hours. Studies/Results: DG Chest Port 1 View  Result Date: 08/22/2019 CLINICAL DATA:  Shortness of breath congestion EXAM: PORTABLE CHEST 1 VIEW COMPARISON:  May 26, 2019 FINDINGS: There is unchanged cardiomegaly. Aortic knob calcifications are seen. There is hyperinflation of the upper lung zones. Ill-defined interstitial reticulonodular opacity seen at the right lung base, more pronounced than on the prior exam. No pleural effusion is seen. No acute osseous abnormality. IMPRESSION: Interstitial reticulonodular opacity more pronounced at the right lung base than on the prior exam which could be due to atelectasis and/or infectious etiology. Findings suggestive of underlying COPD. Electronically Signed   By: Ebony Cargo.D.  On: 08/22/2019 19:41   DG Foot Complete Left  Result Date: 08/22/2019 CLINICAL DATA:  [place], great toe pain EXAM: LEFT FOOT - COMPLETE 3+ VIEW COMPARISON:  12/23/2017 FINDINGS: Frontal, oblique, and lateral views of the left foot are obtained. Chronic posttraumatic/postsurgical changes at the first metatarsal. Prior healed second metatarsal fracture unchanged. 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 Diffuse hammertoe deformities are noted. There are no acute displaced fractures. Prominent dorsal soft tissue swelling of the foot. Diffuse vascular calcifications. IMPRESSION: 1. Chronic postsurgical/posttraumatic changes of the first and second metatarsal. 2. Stable severe first metatarsophalangeal  joint osteoarthritis. 3. Dorsal soft tissue swelling. 4. No acute fracture. Electronically Signed   By: Randa Ngo M.D.   On: 08/22/2019 19:41   Medications: Infusions:   Scheduled Medications: . apixaban  2.5 mg Oral BID  . atorvastatin  80 mg Oral Daily  . azithromycin  500 mg Oral QHS  . budesonide (PULMICORT) nebulizer solution  0.25 mg Nebulization BID  . carvedilol  25 mg Oral BID  . Chlorhexidine Gluconate Cloth  6 each Topical Daily  . clopidogrel  75 mg Oral Daily  . diltiazem  60 mg Oral Q6H  . gabapentin  300 mg Oral QHS  . gentamicin cream  1 application Topical Daily  . guaiFENesin  600 mg Oral BID  . hydrALAZINE  25 mg Oral Q8H  . ipratropium-albuterol  3 mL Nebulization BID  . isosorbide mononitrate  60 mg Oral Daily  . methylPREDNISolone (SOLU-MEDROL) injection  40 mg Intravenous Q12H  . pantoprazole  40 mg Oral BID    have reviewed scheduled and prn medications.  Physical Exam: General: sitting up in bedside chair-  nad-  On O2 Heart: still fast Lungs: dec BS at bases with wheezing Abdomen: distended Extremities: 1+ edema Dialysis Access: PD cath    08/24/2019,11:27 AM  LOS: 2 days

## 2019-08-24 NOTE — Evaluation (Signed)
Physical Therapy Evaluation Patient Details Name: Aaron [name] MRN: [id number] DOB: 1955/12/13 Today's Date: 08/24/2019   History of Present Illness  Pt is a 64 y/o male admitted secondary to worsening SOB thought to be from COPD exacerbation. Pt also with a fib and RVR. PMH includes HTN, COPD, ESRD on PD, CHF, and a fib.   Clinical Impression  Pt admitted secondary to problem above with deficits below. Pt reporting "swimmy headedness" which limited mobility. Pt oxygen sats at 91% on 2L, and HR elevated to 122. Required min guard A for mobility within the room using RW. Anticipate pt will progress well once symptoms improve. Will continue to follow acutely to maximize functional mobility independence and safety.     Follow Up Recommendations Home health PT    Equipment Recommendations  None recommended by PT    Recommendations for Other Services       Precautions / Restrictions Precautions Precautions: Fall Restrictions Weight Bearing Restrictions: No      Mobility  Bed Mobility Overal bed mobility: Needs Assistance Bed Mobility: Sit to Supine       Sit to supine: Supervision   General bed mobility comments: Supervision for safety.   Transfers Overall transfer level: Needs assistance Equipment used: Rolling walker (2 wheeled) Transfers: Sit to/from Stand Sit to Stand: Min guard         General transfer comment: Min guard for safety. Demonstrated safe hand placement.   Ambulation/Gait Ambulation/Gait assistance: Min guard Gait Distance (Feet): 15 Feet Assistive device: Rolling walker (2 wheeled) Gait Pattern/deviations: Step-through pattern;Decreased stride length Gait velocity: Decreased   General Gait Details: Overall steady with RW within room. Pt reporting some "swimmy headedness" which limited mobility tolerance. Pt oxygen sats at 91% on 2L and HR elevating to 122 bpm.   Stairs            Wheelchair Mobility    Modified Rankin (Stroke Patients  Only)       Balance Overall balance assessment: Needs assistance Sitting-balance support: No upper extremity supported Sitting balance-Leahy Scale: Good     Standing balance support: Bilateral upper extremity supported;During functional activity Standing balance-Leahy Scale: Poor Standing balance comment: Reliant on BUE support                             Pertinent Vitals/Pain Pain Assessment: No/denies pain    Home Living Family/patient expects to be discharged to:: Private residence Living Arrangements: Alone Available Help at Discharge: Family Type of Home: Apartment Home Access: Level entry     Home Layout: One level Home Equipment: Cane - single point;Grab bars - tub/shower;Grab bars - toilet;Bedside commode;Shower seat;Wheelchair - [employer] - 4 wheels;Walker - 2 wheels;Walker - standard      Prior Function Level of Independence: Independent with assistive device(s)         Comments: SPC vs RW     Hand Dominance   Dominant Hand: Right    Extremity/Trunk Assessment   Upper Extremity Assessment Upper Extremity Assessment: Defer to OT evaluation    Lower Extremity Assessment Lower Extremity Assessment: Generalized weakness    Cervical / Trunk Assessment Cervical / Trunk Assessment: Normal  Communication   Communication: No difficulties  Cognition Arousal/Alertness: Awake/alert Behavior During Therapy: WFL for tasks assessed/performed Overall Cognitive Status: Within Functional Limits for tasks assessed  General Comments      Exercises     Assessment/Plan    PT Assessment Patient needs continued PT services  PT Problem List Decreased strength;Decreased balance;Decreased mobility;Decreased activity tolerance;Decreased knowledge of use of DME;Cardiopulmonary status limiting activity       PT Treatment Interventions DME instruction;Gait training;Functional mobility  training;Therapeutic exercise;Therapeutic activities;Balance training;Patient/family education    PT Goals (Current goals can be found in the Care Plan section)  Acute Rehab PT Goals Patient Stated Goal: to go home PT Goal Formulation: With patient Time For Goal Achievement: 09/07/19 Potential to Achieve Goals: Good    Frequency Min 3X/week   Barriers to discharge        Co-evaluation               AM-PAC PT "6 Clicks" Mobility  Outcome Measure Help needed turning from your back to your side while in a flat bed without using bedrails?: None Help needed moving from lying on your back to sitting on the side of a flat bed without using bedrails?: None Help needed moving to and from a bed to a chair (including a wheelchair)?: A Little Help needed standing up from a chair using your arms (e.g., wheelchair or bedside chair)?: A Little Help needed to walk in hospital room?: A Little Help needed climbing 3-5 steps with a railing? : A Lot 6 Click Score: 19    End of Session   Activity Tolerance: Patient limited by fatigue Patient left: in bed;with call bell/phone within reach Nurse Communication: Mobility status PT Visit Diagnosis: Other abnormalities of gait and mobility (R26.89);Muscle weakness (generalized) (M62.81)    Time: 2641-5830 PT Time Calculation (min) (ACUTE ONLY): 16 min   Charges:   PT Evaluation $PT Eval Moderate Complexity: 1 Mod          Reuel Derby, PT, DPT  Acute Rehabilitation Services  Pager: [phone] Office: [phone]   Rudean Hitt 08/24/2019, 6:18 PM

## 2019-08-24 NOTE — Progress Notes (Signed)
PROGRESS NOTE    Aaron [name]  ZOX:[id number] DOB: May 15, 1955 DOA: 08/22/2019 PCP: Gwenlyn Saran [place]    Brief Narrative:   64 year old gentleman with medical history significant for hypertension, hyperlipidemia, chronic atrial fibrillation on Eliquis, ESRD on peritoneal dialysis presented to the emergency department for evaluation of shortness of breath.  Patient does have history of COPD, he recently quit his smoking few months ago.  He does have some wheezing at baseline.  Came to the ER with worsening shortness of breath. In the emergency room, he was comfortable, found to have A. fib with RVR started on Cardizem drip.  Transfer to [facility] for progressive bed from [facility].   Assessment & Plan:   Principal Problem:   COPD with acute exacerbation ([place]) Active Problems:   Essential hypertension   ESRD on dialysis ([place])   Exertional dyspnea   Acute exacerbation of CHF (congestive heart failure) (HCC)   Atrial fibrillation with RVR (HCC)  COPD with acute exacerbation: He still has significant wheezing and shortness of breath. Continue aggressive bronchodilator therapy, IV steroids, inhalational steroids, scheduled and as needed bronchodilators, deep breathing exercises, incentive spirometry, chest physiotherapy. Azithromycin for 5 days.   Supplemental oxygen to keep saturations more than 92%.  Start mobilizing.  Chronic atrial fibrillation with RVR: Still with elevated heart rate but asymptomatic.  Acceptable.  Heart rate usually 100-110 and exacerbated by use of albuterol. Patient back on Coreg 25 mg twice a day, diltiazem 60 mg every 6 hours.  He is therapeutic on Eliquis.  His heart rate is acceptable.  ESRD on peritoneal dialysis: Followed by nephrology.  Medication adjustment as per them.  Chronic medical issues including hypertension remains a stable.   DVT prophylaxis: Eliquis Code Status: Full code Family Communication:  None Disposition Plan: Status is: Inpatient  Remains inpatient appropriate because:Inpatient level of care appropriate due to severity of illness   Dispo: The patient is from: Home              Anticipated d/c is to: Home              Anticipated d/c date is: 2 days              Patient currently is not medically stable to d/c.          Consultants:   Nephrology  Procedures:   Peritoneal dialysis  Antimicrobials:   Azithromycin, 511---   Subjective: Patient seen and examined.  He thinks his leg swelling is better today.  Still significantly wheezing.  He has cough but unable to produce any sputum.  Afebrile.  Heart rate 100-110. Telemetry shows atrial fibrillation.  Objective: Vitals:   08/24/19 0745 08/24/19 0818 08/24/19 0953 08/24/19 1039  BP: (!) 141/91  (!) 140/98 (!) 150/105  Pulse: 62  91 (!) 105  Resp: 18   18  Temp:    97.6 F (36.4 C)  TempSrc:    Oral  SpO2: 100% 98%  98%  Weight:    90 kg  Height:        Intake/Output Summary (Last 24 hours) at 08/24/2019 1105 Last data filed at 08/24/2019 0546 Gross per 24 hour  Intake 363.25 ml  Output --  Net 363.25 ml   Filed Weights   08/22/19 1849 08/24/19 0442 08/24/19 1039  Weight: 86.6 kg 91.4 kg 90 kg    Examination:  General exam: Appears calm and comfortable, chronically sick looking  , on 2 liters of oxygen Respiratory  system: b/l expiratory wheezes. Audible wheezing  Cardiovascular system: S1 & S2 heard, irregularly irregular, No JVD, murmurs, rubs, gallops or clicks. 1+ bilateral pedal edema . Gastrointestinal system: Abdomen is nondistended, soft and nontender. No organomegaly or masses felt. Normal bowel sounds heard. PD infusing. Central nervous system: Alert and oriented. No focal neurological deficits. Extremities: Symmetric 5 x 5 power. Skin: No rashes, lesions or ulcers Psychiatry: Judgement and insight appear normal. Mood & affect appropriate.     Data Reviewed: I have  personally reviewed following labs and imaging studies  CBC: Recent Labs  Lab 08/22/19 1930 08/23/19 0348 08/24/19 0908  WBC 10.8* 9.9 13.5*  HGB 12.0* 11.5* 12.1*  HCT 37.1* 36.3* 37.6*  MCV 99.7 100.3* 99.2  PLT 210 201 191   Basic Metabolic Panel: Recent Labs  Lab 08/22/19 1930 08/23/19 0348 08/24/19 0908  NA 136 137 137  K 3.8 3.9 3.4*  CL 100 100 99  CO2 21* 23 25  GLUCOSE 98 131* 167*  BUN 58* 57* 55*  CREATININE 6.79* 6.87* 6.66*  CALCIUM 7.5* 7.3* 8.0*  MG 1.7  --   --   PHOS  --  7.1* 6.2*   GFR: Estimated Creatinine Clearance: 13.9 mL/min (A) (by C-G formula based on SCr of 6.66 mg/dL (H)). Liver Function Tests: Recent Labs  Lab 08/24/19 0908  ALBUMIN 2.2*   No results for input(s): LIPASE, AMYLASE in the last 168 hours. No results for input(s): AMMONIA in the last 168 hours. Coagulation Profile: No results for input(s): INR, PROTIME in the last 168 hours. Cardiac Enzymes: No results for input(s): CKTOTAL, CKMB, CKMBINDEX, TROPONINI in the last 168 hours. BNP (last 3 results) No results for input(s): PROBNP in the last 8760 hours. HbA1C: No results for input(s): HGBA1C in the last 72 hours. CBG: No results for input(s): GLUCAP in the last 168 hours. Lipid Profile: No results for input(s): CHOL, HDL, LDLCALC, TRIG, CHOLHDL, LDLDIRECT in the last 72 hours. Thyroid Function Tests: No results for input(s): TSH, T4TOTAL, FREET4, T3FREE, THYROIDAB in the last 72 hours. Anemia Panel: No results for input(s): VITAMINB12, FOLATE, FERRITIN, TIBC, IRON, RETICCTPCT in the last 72 hours. Sepsis Labs: No results for input(s): PROCALCITON, LATICACIDVEN in the last 168 hours.  Recent Results (from the past 240 hour(s))  SARS Coronavirus 2 by RT PCR (hospital order, performed in [facility] hospital lab) Nasopharyngeal Nasopharyngeal Swab     Status: None   Collection Time: 08/22/19  8:53 PM   Specimen: Nasopharyngeal Swab  Result Value Ref Range Status    SARS Coronavirus 2 NEGATIVE NEGATIVE Final    Comment: (NOTE) SARS-CoV-2 target nucleic acids are NOT DETECTED. The SARS-CoV-2 RNA is generally detectable in upper and lower respiratory specimens during the acute phase of infection. The lowest concentration of SARS-CoV-2 viral copies this assay can detect is 250 copies / mL. A negative result does not preclude SARS-CoV-2 infection and should not be used as the sole basis for treatment or other patient management decisions.  A negative result may occur with improper specimen collection / handling, submission of specimen other than nasopharyngeal swab, presence of viral mutation(s) within the areas targeted by this assay, and inadequate number of viral copies (<250 copies / mL). A negative result must be combined with clinical observations, patient history, and epidemiological information. Fact Sheet for Patients:   StrictlyIdeas.no Fact Sheet for Healthcare Providers: BankingDealers.co.za This test is not yet approved or cleared  by the [place] FDA and has been authorized for  detection and/or diagnosis of SARS-CoV-2 by FDA under an Emergency Use Authorization (EUA).  This EUA will remain in effect (meaning this test can be used) for the duration of the COVID-19 declaration under Section 564(b)(1) of the Act, 21 U.S.C. section 360bbb-3(b)(1), unless the authorization is terminated or revoked sooner. Performed at [facility], [address] [place], [place] [address]   Culture, blood (routine x 2)     Status: None (Preliminary result)   Collection Time: 08/22/19  9:34 PM   Specimen: BLOOD RIGHT ARM  Result Value Ref Range Status   Specimen Description BLOOD RIGHT ARM  Final   Special Requests   Final    BOTTLES DRAWN AEROBIC AND ANAEROBIC Blood Culture adequate volume   Culture   Final    NO GROWTH 2 DAYS Performed at [facility], [address] [place], [place] [address]     Report Status PENDING  Incomplete  Culture, blood (routine x 2)     Status: None (Preliminary result)   Collection Time: 08/22/19  9:35 PM   Specimen: BLOOD RIGHT ARM  Result Value Ref Range Status   Specimen Description BLOOD RIGHT ARM  Final   Special Requests   Final    BOTTLES DRAWN AEROBIC AND ANAEROBIC Blood Culture adequate volume   Culture   Final    NO GROWTH 2 DAYS Performed at [facility], [address], [place], [place] [address]    Report Status PENDING  Incomplete         Radiology Studies: DG Chest Port 1 View  Result Date: 08/22/2019 CLINICAL DATA:  Shortness of breath congestion EXAM: PORTABLE CHEST 1 VIEW COMPARISON:  May 26, 2019 FINDINGS: There is unchanged cardiomegaly. Aortic knob calcifications are seen. There is hyperinflation of the upper lung zones. Ill-defined interstitial reticulonodular opacity seen at the right lung base, more pronounced than on the prior exam. No pleural effusion is seen. No acute osseous abnormality. IMPRESSION: Interstitial reticulonodular opacity more pronounced at the right lung base than on the prior exam which could be due to atelectasis and/or infectious etiology. Findings suggestive of underlying COPD. Electronically Signed   By: Prudencio Pair M.D.   On: 08/22/2019 19:41   DG Foot Complete Left  Result Date: 08/22/2019 CLINICAL DATA:  [place], great toe pain EXAM: LEFT FOOT - COMPLETE 3+ VIEW COMPARISON:  12/23/2017 FINDINGS: Frontal, oblique, and lateral views of the left foot are obtained. Chronic posttraumatic/postsurgical changes at the first metatarsal. Prior healed second metatarsal fracture unchanged. 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 Diffuse hammertoe deformities are noted. There are no acute displaced fractures. Prominent dorsal soft tissue swelling of the foot. Diffuse vascular calcifications. IMPRESSION: 1. Chronic postsurgical/posttraumatic changes of the first and second  metatarsal. 2. Stable severe first metatarsophalangeal joint osteoarthritis. 3. Dorsal soft tissue swelling. 4. No acute fracture. Electronically Signed   By: Randa Ngo M.D.   On: 08/22/2019 19:41        Scheduled Meds: . apixaban  2.5 mg Oral BID  . atorvastatin  80 mg Oral Daily  . azithromycin  500 mg Oral QHS  . budesonide (PULMICORT) nebulizer solution  0.25 mg Nebulization BID  . carvedilol  25 mg Oral BID  . Chlorhexidine Gluconate Cloth  6 each Topical Daily  . clopidogrel  75 mg Oral Daily  . diltiazem  60 mg Oral Q6H  . gabapentin  300 mg Oral QHS  . gentamicin cream  1 application Topical Daily  . guaiFENesin  600  mg Oral BID  . hydrALAZINE  25 mg Oral Q8H  . ipratropium-albuterol  3 mL Nebulization BID  . isosorbide mononitrate  60 mg Oral Daily  . methylPREDNISolone (SOLU-MEDROL) injection  40 mg Intravenous Q12H  . pantoprazole  40 mg Oral BID   Continuous Infusions:   LOS: 2 days    Time spent: 25 minutes    Barb Merino, MD [facility] Pager [phone]

## 2019-08-24 NOTE — Progress Notes (Signed)
PT Cancellation Note  Patient Details Name: Aaron [name] MRN: [id number] DOB: [birth year]   Cancelled Treatment:    Reason Eval/Treat Not Completed: Medical issues which prohibited therapy Pt with increased nausea/vomiting; RN requesting to hold this morning. Will follow up as schedule allows.   Reuel Derby, PT, DPT  Acute Rehabilitation Services  Pager: [phone] Office: [phone]    Rudean Hitt 08/24/2019, 11:59 AM

## 2019-08-25 ENCOUNTER — Inpatient Hospital Stay (HOSPITAL_COMMUNITY): Payer: 59

## 2019-08-25 LAB — CBC WITH DIFFERENTIAL/PLATELET
Abs Immature Granulocytes: 0.1 10*3/uL — ABNORMAL HIGH (ref 0.00–0.07)
Basophils Absolute: 0 10*3/uL (ref 0.0–0.1)
Basophils Relative: 0 %
Eosinophils Absolute: 0 10*3/uL (ref 0.0–0.5)
Eosinophils Relative: 0 %
HCT: 39.2 % (ref 39.0–52.0)
Hemoglobin: 12.7 g/dL — ABNORMAL LOW (ref 13.0–17.0)
Immature Granulocytes: 1 %
Lymphocytes Relative: 2 %
Lymphs Abs: 0.4 10*3/uL — ABNORMAL LOW (ref 0.7–4.0)
MCH: 32.2 pg (ref 26.0–34.0)
MCHC: 32.4 g/dL (ref 30.0–36.0)
MCV: 99.2 fL (ref 80.0–100.0)
Monocytes Absolute: 0.3 10*3/uL (ref 0.1–1.0)
Monocytes Relative: 1 %
Neutro Abs: 19.9 10*3/uL — ABNORMAL HIGH (ref 1.7–7.7)
Neutrophils Relative %: 96 %
Platelets: 340 10*3/uL (ref 150–400)
RBC: 3.95 MIL/uL — ABNORMAL LOW (ref 4.22–5.81)
RDW: 14.4 % (ref 11.5–15.5)
WBC: 20.8 10*3/uL — ABNORMAL HIGH (ref 4.0–10.5)
nRBC: 0 % (ref 0.0–0.2)

## 2019-08-25 LAB — BODY FLUID CELL COUNT WITH DIFFERENTIAL
Lymphs, Fluid: 45 %
Monocyte-Macrophage-Serous Fluid: 45 % — ABNORMAL LOW (ref 50–90)
Neutrophil Count, Fluid: 10 % (ref 0–25)
Total Nucleated Cell Count, Fluid: 33 cu mm (ref 0–1000)

## 2019-08-25 LAB — RENAL FUNCTION PANEL
Albumin: 2.2 g/dL — ABNORMAL LOW (ref 3.5–5.0)
Anion gap: 18 — ABNORMAL HIGH (ref 5–15)
BUN: 56 mg/dL — ABNORMAL HIGH (ref 8–23)
CO2: 28 mmol/L (ref 22–32)
Calcium: 8.2 mg/dL — ABNORMAL LOW (ref 8.9–10.3)
Chloride: 94 mmol/L — ABNORMAL LOW (ref 98–111)
Creatinine, Ser: 6.31 mg/dL — ABNORMAL HIGH (ref 0.61–1.24)
GFR calc Af Amer: 10 mL/min — ABNORMAL LOW (ref >60)
GFR calc non Af Amer: 9 mL/min — ABNORMAL LOW (ref >60)
Glucose, Bld: 211 mg/dL — ABNORMAL HIGH (ref 70–99)
Phosphorus: 7.2 mg/dL — ABNORMAL HIGH (ref 2.5–4.6)
Potassium: 3.6 mmol/L (ref 3.5–5.1)
Sodium: 140 mmol/L (ref 135–145)

## 2019-08-25 LAB — LIPASE, BLOOD: Lipase: 23 U/L (ref 11–51)

## 2019-08-25 MED ORDER — CALCIUM ACETATE (PHOS BINDER) 667 MG PO CAPS
1334.0000 mg | ORAL_CAPSULE | Freq: Three times a day (TID) | ORAL | Status: DC
  Administered 2019-08-26 – 2019-09-09 (×17): 1334 mg via ORAL
  Filled 2019-08-25 (×21): qty 2

## 2019-08-25 MED ORDER — POTASSIUM CHLORIDE CRYS ER 20 MEQ PO TBCR
40.0000 meq | EXTENDED_RELEASE_TABLET | Freq: Once | ORAL | Status: AC
  Administered 2019-08-25: 40 meq via ORAL
  Filled 2019-08-25: qty 2

## 2019-08-25 MED ORDER — METHYLPREDNISOLONE SODIUM SUCC 40 MG IJ SOLR: 40.0000 mg | Freq: Every day | INTRAMUSCULAR | Status: DC

## 2019-08-25 MED ORDER — IOHEXOL 300 MG/ML  SOLN
100.0000 mL | Freq: Once | INTRAMUSCULAR | Status: AC | PRN
  Administered 2019-08-25: 100 mL via INTRAVENOUS

## 2019-08-25 MED ORDER — PROMETHAZINE HCL 25 MG/ML IJ SOLN
6.2500 mg | Freq: Four times a day (QID) | INTRAMUSCULAR | Status: AC | PRN
  Administered 2019-08-25: 6.25 mg via INTRAVENOUS
  Filled 2019-08-25: qty 1

## 2019-08-25 MED ORDER — NEPRO/CARBSTEADY PO LIQD
237.0000 mL | Freq: Two times a day (BID) | ORAL | Status: DC
  Administered 2019-08-25 – 2019-08-26 (×3): 237 mL via ORAL

## 2019-08-25 MED ORDER — FUROSEMIDE 10 MG/ML IJ SOLN
80.0000 mg | Freq: Two times a day (BID) | INTRAMUSCULAR | Status: DC
  Administered 2019-08-25 (×2): 80 mg via INTRAVENOUS
  Filled 2019-08-25 (×2): qty 8

## 2019-08-25 MED ORDER — PANTOPRAZOLE SODIUM 40 MG IV SOLR
40.0000 mg | Freq: Two times a day (BID) | INTRAVENOUS | Status: DC
  Administered 2019-08-25 – 2019-09-07 (×27): 40 mg via INTRAVENOUS
  Filled 2019-08-25 (×27): qty 40

## 2019-08-25 NOTE — Progress Notes (Signed)
Patient continue to have intermittent bilious vomiting. He had benign abdomen in the morning exam. Came back to examine.  Slight discomfort, spontaneous vomiting with bile and food materials. Abdomen distended but soft, no rigidity or guarding, no evidence of peritonitis or acute surgical abdomen. Last BM 5/12, passing gas.  Differential: Peptic ulcer disease, on plavix, eliquis and steroids, will treat with IV PPI. Bowel rest , clears as tolerated.  CBC , Lipase, CT abdomen and pelvis with oral and IV contrast. If unable to tolerate oral contrast, will use iv only. Will also send dialysate for culture.

## 2019-08-25 NOTE — Progress Notes (Signed)
Patient c/o not feeling good, patient stated he feel like he is going to die, and he is vomiting greenish color, VSS. Zofran iv given. MD page, MD came to see patient, see epic for  new orders. Patient is a little better after dose of zofran. Also this am pt. Have 5 beats of V. Tach asymptomatic. MD notified. No new order given will continue to monitor the patient.

## 2019-08-25 NOTE — Progress Notes (Signed)
Subjective:    He says breathing is a little better "not where it needs to be"  D almost complete-  Another 1300 UF but less than I would expect with 4.25% fluid-  Also nauseated    Objective  Vital signs in last 24 hours: Vitals:   08/25/19 0521 08/25/19 0525 08/25/19 0745 08/25/19 0752  BP:  (!) 134/93 (!) 131/93   Pulse:  (!) 53 82   Resp:  18 18   Temp:  98 F (36.7 C) 98 F (36.7 C)   TempSrc:  Oral Oral   SpO2:  94% 95% 96%  Weight: 88.4 kg     Height:       Weight change: -1.4 kg  Intake/Output Summary (Last 24 hours) at 08/25/2019 3299 Last data filed at 08/25/2019 0243 Gross per 24 hour  Intake 480 ml  Output 501 ml  Net -21 ml    Assessment/ Plan: Pt is a 64 y.o. yo male with ESRD on PD who was admitted on 08/22/2019 with SOB-  Felt to have COPD exacerbation/Afib with rapid rate and possibly some volume overload   Assessment/Plan: 1. SOB-  Felt to be multifactorial - COPD/  Afib with rapid rate and volume overload.  He is clinically improved with measures so far-  Steroids/inhalers/azithro.  He thinks that he may need O2 as OP - per primary team 2. ESRD -  Has been on PD for 6 mos. followed in [place].  Albumin is low which can be problematic in PD.  Using 4.25% PD fluid for max UF-  Will increase number of exchanges and shorten dwell-  Also dose with IV lasix since making urine.  His hypoalbuminemia is an issue.  Would not expect nausea to come from kidneys as is well dialyzed-  Maybe steroids  3. Anemia- not an issue right now 4. Secondary hyperparathyroidism - calc OK-  Phos slightly elevated - says is on phoslo - will add  5. HTN/volume-  Plenty of BP to work with -  On coreg and dilt for Afib.  Attempting generous UF.  If UF brings BP down would have a low threshold to stop hydralazine- will stop today  6.  Will give dose of potassium again today   Louis Meckel    Labs: Basic Metabolic Panel: Recent Labs  Lab 08/23/19 0348 08/24/19 0908  08/25/19 0622  NA 137 137 140  K 3.9 3.4* 3.6  CL 100 99 94*  CO2 23 25 28   GLUCOSE 131* 167* 211*  BUN 57* 55* 56*  CREATININE 6.87* 6.66* 6.31*  CALCIUM 7.3* 8.0* 8.2*  PHOS 7.1* 6.2* 7.2*   Liver Function Tests: Recent Labs  Lab 08/24/19 0908 08/25/19 0622  ALBUMIN 2.2* 2.2*   No results for input(s): LIPASE, AMYLASE in the last 168 hours. No results for input(s): AMMONIA in the last 168 hours. CBC: Recent Labs  Lab 08/22/19 1930 08/23/19 0348 08/24/19 0908  WBC 10.8* 9.9 13.5*  HGB 12.0* 11.5* 12.1*  HCT 37.1* 36.3* 37.6*  MCV 99.7 100.3* 99.2  PLT 210 201 266   Cardiac Enzymes: No results for input(s): CKTOTAL, CKMB, CKMBINDEX, TROPONINI in the last 168 hours. CBG: No results for input(s): GLUCAP in the last 168 hours.  Iron Studies: No results for input(s): IRON, TIBC, TRANSFERRIN, FERRITIN in the last 72 hours. Studies/Results: No results found. Medications: Infusions:   Scheduled Medications: . apixaban  5 mg Oral BID  . atorvastatin  80 mg Oral Daily  . azithromycin  500 mg Oral QHS  . budesonide (PULMICORT) nebulizer solution  0.25 mg Nebulization BID  . carvedilol  25 mg Oral BID  . Chlorhexidine Gluconate Cloth  6 each Topical Daily  . clopidogrel  75 mg Oral Daily  . diltiazem  60 mg Oral Q6H  . gabapentin  300 mg Oral QHS  . gentamicin cream  1 application Topical Daily  . guaiFENesin  600 mg Oral BID  . hydrALAZINE  25 mg Oral Q8H  . ipratropium-albuterol  3 mL Nebulization BID  . isosorbide mononitrate  60 mg Oral Daily  . methylPREDNISolone (SOLU-MEDROL) injection  40 mg Intravenous Q12H  . pantoprazole  40 mg Oral BID    have reviewed scheduled and prn medications.  Physical Exam: General: sitting up in bedside chair-  nad-  On O2 Heart: still fast Lungs: dec BS at bases with wheezing Abdomen: distended Extremities: 1+ edema Dialysis Access: PD cath    08/25/2019,8:22 AM  LOS: 3 days

## 2019-08-25 NOTE — Progress Notes (Signed)
PT Cancellation Note  Patient Details Name: Aaron [name] MRN: [id number] DOB: 1956-02-15   Cancelled Treatment:    Reason Eval/Treat Not Completed: Patient declined, no reason specified Pt refusing, stating he isn't working with PT and to come back tomorrow. Will follow up as schedule allows.   Aaron [name], PT, DPT  Acute Rehabilitation Services  Pager: [phone] Office: [phone]    Rudean Hitt 08/25/2019, 1:01 PM

## 2019-08-25 NOTE — Progress Notes (Signed)
PROGRESS NOTE    Aaron [name]  NFA:[id number] DOB: 18-Apr-1955 DOA: 08/22/2019 PCP: Gwenlyn Saran [place]    Brief Narrative:   64 year old gentleman with medical history significant for hypertension, hyperlipidemia, chronic atrial fibrillation on Eliquis, ESRD on peritoneal dialysis presented to the emergency department for evaluation of shortness of breath.  Patient does have history of COPD, he recently quit his smoking few months ago.  He does have some wheezing at baseline.  Came to the ER with worsening shortness of breath. In the emergency room, he was comfortable, found to have A. fib with RVR started on Cardizem drip.  Transfer to [facility] for progressive bed from [facility]   Assessment & Plan:   Principal Problem:   COPD with acute exacerbation ([place]) Active Problems:   Essential hypertension   ESRD on dialysis ([place])   Exertional dyspnea   Acute exacerbation of CHF (congestive heart failure) (H[facility])   Atrial fibrillation with RVR ([facility])  COPD with acute exacerbation: still has significant wheezing and shortness of breath. Continue aggressive bronchodilator therapy, decrease the steroid doses, continue with bronchodilators.  Repeat chest x-ray today. deep breathing exercises, incentive spirometry, chest physiotherapy. Azithromycin for 5 days.   Supplemental oxygen to keep saturations more than 92%.  Start mobilizing. Will evaluate whether he needs home oxygen.  Chronic atrial fibrillation with RVR: Still with elevated heart rate but asymptomatic.  Acceptable.  Heart rate usually 100-110 and exacerbated by use of albuterol. Patient back on Coreg 25 mg twice a day, diltiazem 60 mg every 6 hours.  He is therapeutic on Eliquis.    ESRD on peritoneal dialysis: Followed by nephrology.  Medication adjustment as per nephrology.  Nausea: With no obvious bowel dysfunction or uremia.  Will decrease steroids.  Symptomatic treatment.   DVT prophylaxis:  Eliquis Code Status: Full code Family Communication: None Disposition Plan: Status is: Inpatient  Remains inpatient appropriate because:Inpatient level of care appropriate due to severity of illness   Dispo: The patient is from: Home              Anticipated d/c is to: Home              Anticipated d/c date is: 2 days              Patient currently is not medically stable to d/c.          Consultants:   Nephrology  Procedures:   Peritoneal dialysis  Antimicrobials:   Azithromycin, 5/11---   Subjective: Patient seen and examined.  He had multiple episodes of nausea.  Feels his belly is bloated. Discussed with nephrology at bedside, they think fluid retention is not a problem.  Last bowel movement yesterday and normal.  Afebrile. Still has wheezing. Telemetry shows A. fib with heart rate less than 110, occasionally 120 with breathing treatments.  Objective: Vitals:   08/25/19 0521 08/25/19 0525 08/25/19 0745 08/25/19 0752  BP:  (!) 134/93 (!) 131/93   Pulse:  (!) 53 82   Resp:  18 18   Temp:  98 F (36.7 C) 98 F (36.7 C)   TempSrc:  Oral Oral   SpO2:  94% 95% 96%  Weight: 88.4 kg     Height:        Intake/Output Summary (Last 24 hours) at 08/25/2019 0945 Last data filed at 08/25/2019 0700 Gross per 24 hour  Intake 480 ml  Output 501 ml  Net -21 ml   Filed Weights   08/24/19  8841 08/24/19 1039 08/25/19 0521  Weight: 91.4 kg 90 kg 88.4 kg    Examination:  General exam: Appears calm and comfortable, chronically sick looking  , on 2 liters of oxygen Respiratory system: b/l expiratory wheezes. Audible wheezing  Cardiovascular system: S1 & S2 heard, irregularly irregular, No JVD, murmurs, rubs, gallops or clicks. 1+ bilateral pedal edema . Gastrointestinal system: Abdomen is distended.  No localized tenderness.  No organomegaly or masses felt. Normal bowel sounds heard. PD infusing. Central nervous system: Alert and oriented. No focal neurological  deficits. Extremities: Symmetric 5 x 5 power. Skin: No rashes, lesions or ulcers Psychiatry: Judgement and insight appear normal. Mood & affect appropriate.     Data Reviewed: I have personally reviewed following labs and imaging studies  CBC: Recent Labs  Lab 08/22/19 1930 08/23/19 0348 08/24/19 0908  WBC 10.8* 9.9 13.5*  HGB 12.0* 11.5* 12.1*  HCT 37.1* 36.3* 37.6*  MCV 99.7 100.3* 99.2  PLT 210 201 660   Basic Metabolic Panel: Recent Labs  Lab 08/22/19 1930 08/23/19 0348 08/24/19 0908 08/25/19 0622  NA 136 137 137 140  K 3.8 3.9 3.4* 3.6  CL 100 100 99 94*  CO2 21* 23 25 28   GLUCOSE 98 131* 167* 211*  BUN 58* 57* 55* 56*  CREATININE 6.79* 6.87* 6.66* 6.31*  CALCIUM 7.5* 7.3* 8.0* 8.2*  MG 1.7  --   --   --   PHOS  --  7.1* 6.2* 7.2*   GFR: Estimated Creatinine Clearance: 14.7 mL/min (A) (by C-G formula based on SCr of 6.31 mg/dL (H)). Liver Function Tests: Recent Labs  Lab 08/24/19 0908 08/25/19 0622  ALBUMIN 2.2* 2.2*   No results for input(s): LIPASE, AMYLASE in the last 168 hours. No results for input(s): AMMONIA in the last 168 hours. Coagulation Profile: No results for input(s): INR, PROTIME in the last 168 hours. Cardiac Enzymes: No results for input(s): CKTOTAL, CKMB, CKMBINDEX, TROPONINI in the last 168 hours. BNP (last 3 results) No results for input(s): PROBNP in the last 8760 hours. HbA1C: No results for input(s): HGBA1C in the last 72 hours. CBG: No results for input(s): GLUCAP in the last 168 hours. Lipid Profile: No results for input(s): CHOL, HDL, LDLCALC, TRIG, CHOLHDL, LDLDIRECT in the last 72 hours. Thyroid Function Tests: No results for input(s): TSH, T4TOTAL, FREET4, T3FREE, THYROIDAB in the last 72 hours. Anemia Panel: No results for input(s): VITAMINB12, FOLATE, FERRITIN, TIBC, IRON, RETICCTPCT in the last 72 hours. Sepsis Labs: No results for input(s): PROCALCITON, LATICACIDVEN in the last 168 hours.  Recent Results (from  the past 240 hour(s))  SARS Coronavirus 2 by RT PCR (hospital order, performed in [facility] hospital lab) Nasopharyngeal Nasopharyngeal Swab     Status: None   Collection Time: 08/22/19  8:53 PM   Specimen: Nasopharyngeal Swab  Result Value Ref Range Status   SARS Coronavirus 2 NEGATIVE NEGATIVE Final    Comment: (NOTE) SARS-CoV-2 target nucleic acids are NOT DETECTED. The SARS-CoV-2 RNA is generally detectable in upper and lower respiratory specimens during the acute phase of infection. The lowest concentration of SARS-CoV-2 viral copies this assay can detect is 250 copies / mL. A negative result does not preclude SARS-CoV-2 infection and should not be used as the sole basis for treatment or other patient management decisions.  A negative result may occur with improper specimen collection / handling, submission of specimen other than nasopharyngeal swab, presence of viral mutation(s) within the areas targeted by this assay, and inadequate  number of viral copies (<250 copies / mL). A negative result must be combined with clinical observations, patient history, and epidemiological information. Fact Sheet for Patients:   StrictlyIdeas.no Fact Sheet for Healthcare Providers: BankingDealers.co.za This test is not yet approved or cleared  by the [place] FDA and has been authorized for detection and/or diagnosis of SARS-CoV-2 by FDA under an Emergency Use Authorization (EUA).  This EUA will remain in effect (meaning this test can be used) for the duration of the COVID-19 declaration under Section 564(b)(1) of the Act, 21 U.S.C. section 360bbb-3(b)(1), unless the authorization is terminated or revoked sooner. Performed at [facility], [address] [place], [place] [address]   Culture, blood (routine x 2)     Status: None (Preliminary result)   Collection Time: 08/22/19  9:34 PM   Specimen: BLOOD RIGHT ARM  Result Value Ref Range  Status   Specimen Description BLOOD RIGHT ARM  Final   Special Requests   Final    BOTTLES DRAWN AEROBIC AND ANAEROBIC Blood Culture adequate volume   Culture   Final    NO GROWTH 2 DAYS Performed at [facility], [address], [place], [place] [address]    Report Status PENDING  Incomplete  Culture, blood (routine x 2)     Status: None (Preliminary result)   Collection Time: 08/22/19  9:35 PM   Specimen: BLOOD RIGHT ARM  Result Value Ref Range Status   Specimen Description BLOOD RIGHT ARM  Final   Special Requests   Final    BOTTLES DRAWN AEROBIC AND ANAEROBIC Blood Culture adequate volume   Culture   Final    NO GROWTH 2 DAYS Performed at [facility], [address] [place], [place] [address]    Report Status PENDING  Incomplete         Radiology Studies: No results found.      Scheduled Meds: . apixaban  5 mg Oral BID  . atorvastatin  80 mg Oral Daily  . azithromycin  500 mg Oral QHS  . budesonide (PULMICORT) nebulizer solution  0.25 mg Nebulization BID  . calcium acetate  1,334 mg Oral TID WC  . carvedilol  25 mg Oral BID  . Chlorhexidine Gluconate Cloth  6 each Topical Daily  . clopidogrel  75 mg Oral Daily  . diltiazem  60 mg Oral Q6H  . feeding supplement (NEPRO CARB STEADY)  237 mL Oral BID BM  . furosemide  80 mg Intravenous BID  . gabapentin  300 mg Oral QHS  . gentamicin cream  1 application Topical Daily  . guaiFENesin  600 mg Oral BID  . ipratropium-albuterol  3 mL Nebulization BID  . isosorbide mononitrate  60 mg Oral Daily  . [START ON 08/26/2019] methylPREDNISolone (SOLU-MEDROL) injection  40 mg Intravenous Daily  . pantoprazole  40 mg Oral BID  . potassium chloride  40 mEq Oral Once   Continuous Infusions:   LOS: 3 days    Time spent: 25 minutes    Barb Merino, MD [facility] Pager [phone]

## 2019-08-25 NOTE — Progress Notes (Addendum)
OT Cancellation Note  Patient Details Name: ANDRES VEST MRN: [id number] DOB: 1955/12/22   Cancelled Treatment:    Reason Eval/Treat Not Completed: Patient at procedure or test/ unavailable. Pt currently receiving dialysis. Plan to reattempt later today.  Tyrone Schimke, OT Acute Rehabilitation Services Pager: [phone] Office: [phone]  08/25/2019, 9:12 AM    12:10 Second attempt made. Pt declined citing "finally starting to feel a little better." Request trying again tomorrow.

## 2019-08-25 NOTE — Progress Notes (Signed)
CT Scan shows partial small bowel obstruction.  Plan  Npo except meds  Dc lasix , dc steroids  Will avoid iv fluids tonight  If persistent vomiting will insert a NG tube

## 2019-08-25 NOTE — Progress Notes (Signed)
Pt is alert and oriented X4. Was nauseous at the beginning of the shift, nausea medication was administered with some relief. Supposed to be hooked up on peritoneal dialysis but the dialysis nurse will be here as soon as she is done with patients on her unit. The renal floor charge nurse was contacted for help but she reports that they are no longer allowed to hook up peritoneal dialysis. Will continue to wait for the dialysis nurse

## 2019-08-25 NOTE — Discharge Instructions (Addendum)
[facility]      [facility] [phone]  OPEN ABDOMINAL SURGERY: POST OP INSTRUCTIONS  Always review your discharge instruction sheet given to you by the facility where your surgery was performed.  IF YOU HAVE DISABILITY OR FAMILY LEAVE FORMS, YOU MUST BRING THEM TO THE OFFICE FOR PROCESSING.  PLEASE DO NOT GIVE THEM TO YOUR DOCTOR.  1. A prescription for pain medication may be given to you upon discharge.  Take your pain medication as prescribed, if needed.  If narcotic pain medicine is not needed, then you may take acetaminophen (Tylenol) or ibuprofen (Advil) as needed. 2. Take your usually prescribed medications unless otherwise directed. 3. If you need a refill on your pain medication, please contact your pharmacy. They will contact our office to request authorization.  Prescriptions will not be filled after 5pm or on week-ends. 4. You should follow a light diet the first few days after arrival home, such as soup and crackers, pudding, etc.unless your doctor has advised otherwise. A high-fiber, low fat diet can be resumed as tolerated.   Be sure to include lots of fluids daily. Most patients will experience some swelling and bruising on the chest and neck area.  Ice packs will help.  Swelling and bruising can take several days to resolve 5. Most patients will experience some swelling and bruising in the area of the incision. Ice pack will help. Swelling and bruising can take several days to resolve..  6. It is common to experience some constipation if taking pain medication after surgery.  Increasing fluid intake and taking a stool softener will usually help or prevent this problem from occurring.  A mild laxative (Milk of Magnesia or Miralax) should be taken according to package directions if there are no bowel movements after 48 hours. 7.  You may have steri-strips (small skin tapes) in place directly over the incision.  These strips should be left on the skin for 7-10 days.  If your  surgeon used skin glue on the incision, you may shower in 24 hours.  The glue will flake off over the next 2-3 weeks.  Any sutures or staples will be removed at the office during your follow-up visit. You may find that a light gauze bandage over your incision may keep your staples from being rubbed or pulled. You may shower and replace the bandage daily. 8. ACTIVITIES:  You may resume regular (light) daily activities beginning the next day--such as daily self-care, walking, climbing stairs--gradually increasing activities as tolerated.  You may have sexual intercourse when it is comfortable.  Refrain from any heavy lifting or straining until approved by your doctor. a. You may drive when you no longer are taking prescription pain medication, you can comfortably wear a seatbelt, and you can safely maneuver your car and apply brakes b. Return to Work: ___________________________________ 9. You should see your doctor in the office for a follow-up appointment approximately two weeks after your surgery.  Make sure that you call for this appointment within a day or two after you arrive home to insure a convenient appointment time. OTHER INSTRUCTIONS:  _____________________________________________________________ _____________________________________________________________  WHEN TO CALL YOUR DOCTOR: 1. Fever over 101.0 2. Inability to urinate 3. Nausea and/or vomiting 4. Extreme swelling or bruising 5. Continued bleeding from incision. 6. Increased pain, redness, or drainage from the incision. 7. Difficulty swallowing or breathing 8. Muscle cramping or spasms. 9. Numbness or tingling in hands or feet or around lips.  The clinic staff is available to   answer your questions during regular business hours.  Please don't hesitate to call and ask to speak to one of the nurses if you have concerns.  For further questions, please visit [email or web address]  Wet to dry dressing  instructions. Remove current packing.  Clean skin with soap and water.  Pat dry.  Repack the open wound with wet to dry dressing as instructed at the hospital.  Use sterile saline as the wetting agent.  Do dressing changes twice a day.        Information on my medicine - ELIQUIS (apixaban)  This medication education was reviewed with me or my healthcare representative as part of my discharge preparation.  The pharmacist that spoke with me during my hospital stay was:  Donnamae Jude, [facility]  Why was Eliquis prescribed for you? Eliquis was prescribed for you to reduce the risk of a blood clot forming that can cause a stroke if you have a medical condition called atrial fibrillation (a type of irregular heartbeat).  What do You need to know about Eliquis ? Take your Eliquis TWICE DAILY - one tablet in the morning and one tablet in the evening with or without food. If you have difficulty swallowing the tablet whole please discuss with your pharmacist how to take the medication safely.  Take Eliquis exactly as prescribed by your doctor and DO NOT stop taking Eliquis without talking to the doctor who prescribed the medication.  Stopping may increase your risk of developing a stroke.  Refill your prescription before you run out.  After discharge, you should have regular check-up appointments with your healthcare provider that is prescribing your Eliquis.  In the future your dose may need to be changed if your kidney function or weight changes by a significant amount or as you get older.  What do you do if you miss a dose? If you miss a dose, take it as soon as you remember on the same day and resume taking twice daily.  Do not take more than one dose of ELIQUIS at the same time to make up a missed dose.  Important Safety Information A possible side effect of Eliquis is bleeding. You should call your healthcare provider right away if you experience any of the following: ? Bleeding from an  injury or your nose that does not stop. ? Unusual colored urine (red or dark brown) or unusual colored stools (red or black). ? Unusual bruising for unknown reasons. ? A serious fall or if you hit your head (even if there is no bleeding).  Some medicines may interact with Eliquis and might increase your risk of bleeding or clotting while on Eliquis. To help avoid this, consult your healthcare provider or pharmacist prior to using any new prescription or non-prescription medications, including herbals, vitamins, non-steroidal anti-inflammatory drugs (NSAIDs) and supplements.  This website has more information on Eliquis (apixaban): http://www.eliquis.com/eliquis/home ===================================================  Atrial Fibrillation    Atrial fibrillation is a type of heartbeat that is irregular or fast. If you have this condition, your heart beats without any order. This makes it hard for your heart to pump blood in a normal way. Atrial fibrillation may come and go, or it may become a long-lasting problem. If this condition is not treated, it can put you at higher risk for stroke, heart failure, and other heart problems.  What are the causes? This condition may be caused by diseases that damage the heart. They include:  High blood pressure.  Heart failure.  Heart valve  disease.  Heart surgery. Other causes include:  Diabetes.  Thyroid disease.  Being overweight.  Kidney disease. Sometimes the cause is not known.  What increases the risk? You are more likely to develop this condition if:  You are older.  You smoke.  You exercise often and very hard.  You have a family history of this condition.  You are a man.  You use drugs.  You drink a lot of alcohol.  You have lung conditions, such as emphysema, pneumonia, or COPD.  You have sleep apnea.   What are the signs or symptoms? Common symptoms of this condition include:  A feeling that your heart is  beating very fast.  Chest pain or discomfort.  Feeling short of breath.  Suddenly feeling light-headed or weak.  Getting tired easily during activity.  Fainting.  Sweating. In some cases, there are no symptoms.  How is this treated? Treatment for this condition depends on underlying conditions and how you feel when you have atrial fibrillation. They include: 1. Medicines to: ? Prevent blood clots. ? Treat heart rate or heart rhythm problems. 2. Using devices, such as a pacemaker, to correct heart rhythm problems. 3. Doing surgery to remove the part of the heart that sends bad signals. 4. Closing an area where clots can form in the heart (left atrial appendage). In some cases, your doctor will treat other underlying conditions.  Follow these instructions at home:  Medicines 1. Take over-the-counter and prescription medicines only as told by your doctor. 2. Do not take any new medicines without first talking to your doctor. 3. If you are taking blood thinners: ? Talk with your doctor before you take any medicines that have aspirin or NSAIDs, such as ibuprofen, in them. ? Take your medicine exactly as told by your doctor. Take it at the same time each day. ? Avoid activities that could hurt or bruise you. Follow instructions about how to prevent falls. ? Wear a bracelet that says you are taking blood thinners. Or, carry a card that lists what medicines you take. Lifestyle          Do not use any products that have nicotine or tobacco in them. These include cigarettes, e-cigarettes, and chewing tobacco. If you need help quitting, ask your doctor.  Eat heart-healthy foods. Talk with your doctor about the right eating plan for you.  Exercise regularly as told by your doctor.  Do not drink alcohol.  Lose weight if you are overweight.  Do not use drugs, including cannabis.  General instructions  If you have a condition that causes breathing to stop for a short period  of time (apnea), treat it as told by your doctor.  Keep a healthy weight. Do not use diet pills unless your doctor says they are safe for you. Diet pills may make heart problems worse.  Keep all follow-up visits as told by your doctor. This is important.  Contact a doctor if:  You notice a change in the speed, rhythm, or strength of your heartbeat.  You are taking a blood-thinning medicine and you get more bruising.  You get tired more easily when you move or exercise.  You have a sudden change in weight.  Get help right away if:    1. You have pain in your chest or your belly (abdomen). 2. You have trouble breathing. 3. You have side effects of blood thinners, such as blood in your vomit, poop (stool), or pee (urine), or bleeding that cannot  stop. 4. You have any signs of a stroke. "BE FAST" is an easy way to remember the main warning signs: ? B - Balance. Signs are dizziness, sudden trouble walking, or loss of balance. ? E - Eyes. Signs are trouble seeing or a change in how you see. ? F - Face. Signs are sudden weakness or loss of feeling in the face, or the face or eyelid drooping on one side. ? A - Arms. Signs are weakness or loss of feeling in an arm. This happens suddenly and usually on one side of the body. ? S - Speech. Signs are sudden trouble speaking, slurred speech, or trouble understanding what people say. ? T - Time. Time to call emergency services. Write down what time symptoms started. 5. You have other signs of a stroke, such as: ? A sudden, very bad headache with no known cause. ? Feeling like you may vomit (nausea). ? Vomiting. ? A seizure.  These symptoms may be an emergency. Do not wait to see if the symptoms will go away. Get medical help right away. Call your local emergency services (911 in the [place] Do not drive yourself to the hospital. Summary  Atrial fibrillation is a type of heartbeat that is irregular or fast.  You are at higher risk of this  condition if you smoke, are older, have diabetes, or are overweight.  Follow your doctor's instructions about medicines, diet, exercise, and follow-up visits.  Get help right away if you have signs or symptoms of a stroke.  Get help right away if you cannot catch your breath, or you have chest pain or discomfort. This information is not intended to replace advice given to you by your health care provider. Make sure you discuss any questions you have with your health care provider. Document Revised: 09/22/2018 Document Reviewed: 09/22/2018 Elsevier Patient Education  [place].

## 2019-08-26 ENCOUNTER — Encounter (HOSPITAL_COMMUNITY): Payer: Self-pay | Admitting: Internal Medicine

## 2019-08-26 ENCOUNTER — Inpatient Hospital Stay (HOSPITAL_COMMUNITY): Payer: 59

## 2019-08-26 LAB — RENAL FUNCTION PANEL
Albumin: 2.4 g/dL — ABNORMAL LOW (ref 3.5–5.0)
Anion gap: 17 — ABNORMAL HIGH (ref 5–15)
BUN: 66 mg/dL — ABNORMAL HIGH (ref 8–23)
CO2: 29 mmol/L (ref 22–32)
Calcium: 8.2 mg/dL — ABNORMAL LOW (ref 8.9–10.3)
Chloride: 96 mmol/L — ABNORMAL LOW (ref 98–111)
Creatinine, Ser: 6.89 mg/dL — ABNORMAL HIGH (ref 0.61–1.24)
GFR calc Af Amer: 9 mL/min — ABNORMAL LOW (ref >60)
GFR calc non Af Amer: 8 mL/min — ABNORMAL LOW (ref >60)
Glucose, Bld: 155 mg/dL — ABNORMAL HIGH (ref 70–99)
Phosphorus: 7.3 mg/dL — ABNORMAL HIGH (ref 2.5–4.6)
Potassium: 3.6 mmol/L (ref 3.5–5.1)
Sodium: 142 mmol/L (ref 135–145)

## 2019-08-26 MED ORDER — DIATRIZOATE MEGLUMINE & SODIUM 66-10 % PO SOLN
90.0000 mL | Freq: Once | ORAL | Status: AC
  Administered 2019-08-26: 90 mL via NASOGASTRIC
  Filled 2019-08-26: qty 90

## 2019-08-26 MED ORDER — SODIUM CHLORIDE 0.9 % IV SOLN: INTRAVENOUS | Status: DC

## 2019-08-26 MED ORDER — ZOLPIDEM TARTRATE 5 MG PO TABS
5.0000 mg | ORAL_TABLET | Freq: Every evening | ORAL | Status: DC | PRN
  Administered 2019-08-27 – 2019-09-10 (×10): 5 mg via ORAL
  Filled 2019-08-26 (×12): qty 1

## 2019-08-26 NOTE — Progress Notes (Addendum)
Subjective:  Patient with worsening abdominal distention and vomiting concern for obstruction or ileus.  CT scan with partial small bowel obstruction.  NG tube being placed when I came by. Overall feels SOB is about the same. Excellent UF overnight w/ 3L removed.  Objective Vital signs in last 24 hours: Vitals:   08/26/19 0454 08/26/19 0736 08/26/19 0816 08/26/19 0955  BP: 109/69 (!) 114/96  (!) 120/102  Pulse: 95 (!) 51  77  Resp: 18 18  20   Temp: 97.8 F (36.6 C) (!) 97.5 F (36.4 C)  97.8 F (36.6 C)  TempSrc: Oral Oral  Oral  SpO2: 99% 99% 98% 95%  Weight:      Height:       Weight change:   Intake/Output Summary (Last 24 hours) at 08/26/2019 1048 Last data filed at 08/26/2019 0815 Gross per 24 hour  Intake 440 ml  Output 450 ml  Net -10 ml    Assessment/ Plan: Pt is a 64 y.o. yo male with ESRD on PD who was admitted on 08/22/2019 with SOB-  Felt to have COPD exacerbation/Afib with rapid rate and possibly some volume overload   Assessment/Plan: 1. SOB-  Felt to be multifactorial - COPD/  Afib with rapid rate and volume overload.  Seems to be improving and UF much better w/ PD changes. S/p Steroids/inhalers/azithro.  He thinks that he may need O2 as OP  2. ESRD -secondary to polycystic kidney disease.  Has been on PD for 6 mos. followed in [place].  Albumin is low which can be problematic in PD and UF.  Using 4.25% PD fluid for max UF. Adjusted dwell times w/ now improved UF. Will Continue and montior. Given newly NPO status may need to consider backing off UF in the coming days 3. Anemia- not an issue right now 4. Secondary hyperparathyroidism - calc OK-  Phos slightly elevated - no binder on med list. Now NPO but if starts to eat again could consider binder 5. HTN/volume-  Plenty of BP to work with -  On coreg and dilt for Afib.  UF as above 6.  Potassium acceptable at current level 7.  Leukocytosis: Likely associated with steroids.  Fluid studies from peritoneal fluid  negative for infection. 8.  Partial small bowel obstruction: N.p.o. except for meds.  Holding Lasix and off of steroids.  NG tube being placed.  Likely related to adhesion and PD catheter in contributing. 9.  Hyperdense kidney cyst: Consider further evaluation with MRI in the future.  Reesa Chew    Labs: Basic Metabolic Panel: Recent Labs  Lab 08/24/19 0908 08/25/19 0622 08/26/19 0631  NA 137 140 142  K 3.4* 3.6 3.6  CL 99 94* 96*  CO2 25 28 29   GLUCOSE 167* 211* 155*  BUN 55* 56* 66*  CREATININE 6.66* 6.31* 6.89*  CALCIUM 8.0* 8.2* 8.2*  PHOS 6.2* 7.2* 7.3*   Liver Function Tests: Recent Labs  Lab 08/24/19 0908 08/25/19 0622 08/26/19 0631  ALBUMIN 2.2* 2.2* 2.4*   Recent Labs  Lab 08/25/19 1740  LIPASE 23   No results for input(s): AMMONIA in the last 168 hours. CBC: Recent Labs  Lab 08/22/19 1930 08/22/19 1930 08/23/19 0348 08/24/19 0908 08/25/19 1740  WBC 10.8*   < > 9.9 13.5* 20.8*  NEUTROABS  --   --   --   --  19.9*  HGB 12.0*   < > 11.5* 12.1* 12.7*  HCT 37.1*   < > 36.3* 37.6* 39.2  MCV 99.7  --  100.3* 99.2 99.2  PLT 210   < > 201 266 340   < > = values in this interval not displayed.   Cardiac Enzymes: No results for input(s): CKTOTAL, CKMB, CKMBINDEX, TROPONINI in the last 168 hours. CBG: No results for input(s): GLUCAP in the last 168 hours.  Iron Studies: No results for input(s): IRON, TIBC, TRANSFERRIN, FERRITIN in the last 72 hours. Studies/Results: CT ABDOMEN PELVIS W CONTRAST  Result Date: 08/25/2019 CLINICAL DATA:  Nausea, vomiting. EXAM: CT ABDOMEN AND PELVIS WITH CONTRAST TECHNIQUE: Multidetector CT imaging of the abdomen and pelvis was performed using the standard protocol following bolus administration of intravenous contrast. CONTRAST:  167mL OMNIPAQUE IOHEXOL 300 MG/ML  SOLN COMPARISON:  November 27, 2018. FINDINGS: Lower chest: Small left pleural effusion is noted with adjacent subsegmental atelectasis. Hepatobiliary: No  gallstones or biliary dilatation is noted. Multiple cysts are again noted in the patent parenchyma. Pancreas: Unremarkable. No pancreatic ductal dilatation or surrounding inflammatory changes. Spleen: Normal in size without focal abnormality. Adrenals/Urinary Tract: Adrenal glands appear normal. Continued presence of findings consistent with severe polycystic kidney disease. No hydronephrosis or renal obstruction is noted. Urinary bladder is unremarkable. 5.4 x 5.1 cm hyperdense abnormality is seen involving the anterior aspect of the superior pole of the left kidney which is enlarged compared to prior exam. This may represent enlarging hyperdense cyst, but possible neoplasm cannot be excluded. Further evaluation with MRI or ultrasound is recommended. Stomach/Bowel: Mild gastric distention is noted. No colonic obstruction is noted. The appendix is not visualized. Proximal small bowel dilatation is noted with possible transition zone seen in the left upper quadrant best seen on image number 41 of series 3. More distal small bowel is not dilated. This is concerning for possible small bowel obstruction, potentially due to adhesion. Vascular/Lymphatic: Aortic atherosclerosis. No enlarged abdominal or pelvic lymph nodes. Reproductive: Prostate is unremarkable. Other: Distal tip of peritoneal dialysis catheter is noted anteriorly within the pelvis. Mild amount of free fluid is noted in the more posterior and dependent portion of the pelvis. Musculoskeletal: Extensive postsurgical and degenerative changes are seen involving the lumbar spine. No acute abnormality is noted. IMPRESSION: 1. Continued presence of findings consistent with severe polycystic kidney disease. 5.4 x 5.1 cm hyperdense abnormality is seen involving anterior aspect of superior pole of left kidney which is enlarged compared to prior exam. This may represent enlarging hyperdense cyst, but possible neoplasm cannot be excluded. Further evaluation with MRI  or ultrasound is recommended. 2. Proximal small bowel dilatation is noted with possible transition zone seen in the left upper quadrant best seen on image number 41 of series 3. More distal small bowel is not dilated. This is concerning for possible small bowel obstruction, potentially due to adhesion. 3. Mild amount of free fluid is noted in the more posterior and dependent portion of the pelvis. 4. Small left pleural effusion is noted with adjacent subsegmental atelectasis. Aortic Atherosclerosis (ICD10-I70.0). Electronically Signed   By: Marijo Conception M.D.   On: 08/25/2019 20:28   DG CHEST PORT 1 VIEW  Result Date: 08/25/2019 CLINICAL DATA:  Shortness of breath. EXAM: PORTABLE CHEST 1 VIEW COMPARISON:  Chest x-ray dated Aug 22, 2019. FINDINGS: The heart size and mediastinal contours are within normal limits. Atherosclerotic calcification of the aortic arch. Normal pulmonary vascularity. Unchanged bibasilar atelectasis/scarring. Emphysematous changes again noted. No focal consolidation, pleural effusion, or pneumothorax. No acute osseous abnormality. IMPRESSION: No active disease.  Emphysema. Electronically Signed  By: Titus Dubin M.D.   On: 08/25/2019 11:27   Medications: Infusions: . sodium chloride 75 mL/hr at 08/26/19 0936    Scheduled Medications: . atorvastatin  80 mg Oral Daily  . budesonide (PULMICORT) nebulizer solution  0.25 mg Nebulization BID  . calcium acetate  1,334 mg Oral TID WC  . carvedilol  25 mg Oral BID  . Chlorhexidine Gluconate Cloth  6 each Topical Daily  . diatrizoate meglumine-sodium  90 mL Per NG tube Once  . diltiazem  60 mg Oral Q6H  . feeding supplement (NEPRO CARB STEADY)  237 mL Oral BID BM  . gabapentin  300 mg Oral QHS  . gentamicin cream  1 application Topical Daily  . guaiFENesin  600 mg Oral BID  . ipratropium-albuterol  3 mL Nebulization BID  . isosorbide mononitrate  60 mg Oral Daily  . pantoprazole (PROTONIX) IV  40 mg Intravenous Q12H     have reviewed scheduled and prn medications.  Physical Exam: General: sitting up in bedside chair-  nad-  On O2 Heart: tachycardia Lungs: bilateral wheezing, no iwob Abdomen: distended Extremities: 1+ edema Dialysis Access: PD cath    08/26/2019,10:48 AM  LOS: 4 days

## 2019-08-26 NOTE — Progress Notes (Signed)
OT Cancellation Note  Patient Details Name: Aaron [name] MRN: [id number] DOB: 1955/10/20   Cancelled Treatment:    Reason Eval/Treat Not Completed: Medical issues which prohibited therapy;Other (comment). Spoke with RN and opt had NG tube placement due to N/V. Will check back when appropriate  Britt Bottom 08/26/2019, 9:58 AM

## 2019-08-26 NOTE — Care Management Important Message (Signed)
Important Message  Patient Details  Name: CARMAN ESSICK MRN: [id number] Date of Birth: [birth year]   Medicare Important Message Given:  Yes     Shelda Altes 08/26/2019, 9:59 AM

## 2019-08-26 NOTE — Consult Note (Signed)
Aaron [name] 1955-06-20  [id number].    Requesting MD: Dr. Barb Merino Chief Complaint/Reason for Consult: SBO  HPI:  This is a 64 yo white male with multiple chronic medical problems as outlined below.  He is normally on plavix and this was held yesterday secondary to persistent N/V.  He was admitted on Tuesday secondary to SOB and LE edema with a COPD exacerbation as well as a CHF exacerbation.  He has recently started PD about 6 months ago for his ESRD.  Prior to this he was on HD.  He had a PD cath placed at [facility] at some point but after a month had severe abdominal pain.  He was found to have the PD cath traverse his colon and had a partial colectomy and PD cath removed.  He was then seen by Dr. Raul Del at [facility] in October and had another PD catheter placed.  He has had a prior ventral hernia and right inguinal hernia repair.  While here for his exacerbations, he began having N/V.  He is still passing flatus and had a BM yesterday, but has had worsening abdominal distention and vomiting.  He underwent a CT scan yesterday that revealed a possible SBO with transition point near his abdominal wall.  We have been asked to see for further evaluation and recommendations.  ROS: ROS: Please see HPI, otherwise all other systems are currently negative except some abdominal pain and jerking motion of his legs which is chronic.  Family History  Problem Relation Age of Onset  . Alcoholism Mother   . Heart disease Father        Massive heart attack  . Heart attack Father   . Atrial fibrillation Father   . Colon cancer Father   . Colon cancer Maternal Grandfather 71  . Alcoholism Maternal Grandfather   . Renal cancer Cousin   . Ovarian cancer Sister     Past Medical History:  Diagnosis Date  . Anxiety   . Arthritis    knees , back , shoulders (10/12/2017)  . Atrial fibrillation ([place])   . CAD (coronary artery disease)    Mild nonobstructive disease at cardiac catheterization 2002  .  Chronic back pain    "back of my neck; down thru my legs" (10/12/2017)  . COPD (chronic obstructive pulmonary disease) ([place])   . Diverticulitis   . Esophageal reflux   . ESRD (end stage renal disease) on dialysis [facility])    "TTS; [place]" (11/23/2017)  . Essential hypertension   . Hepatitis C    states he no longer has this  . History of kidney stones   . History of syncope   . Hyperlipidemia   . Jerking 09/23/2014  . Myocardial infarction ([place]) 02/2017   "light one" (10/12/2017)  . Non-compliant behavior    non compliant with diaylsis per daughter  . PAT (paroxysmal atrial tachycardia) ([place])   . Peripheral arterial disease ([place])    Occluded left superficial femoral artery status post stent June 2016 - Dr. Trula Slade  . Pneumonia 1961  . Polycystic kidney, unspecified type   . Syncope 09/2014  . SYNCOPE 05/07/2010   Qualifier: Diagnosis of  By: Laurance Flatten RN, BSN, Anderson Malta      Past Surgical History:  Procedure Laterality Date  . ABDOMINAL AORTOGRAM N/A 08/11/2017   Procedure: ABDOMINAL AORTOGRAM;  Surgeon: Serafina Mitchell, MD;  Location: [place] CV LAB;  Service: Cardiovascular;  Laterality: N/A;  . ANTERIOR CERVICAL DECOMP/DISCECTOMY FUSION    .  APPLICATION OF WOUND VAC Right 08/14/2017   Procedure: APPLICATION OF PREVENA INCISIONAL WOUND VAC RIGHT GROIN;  Surgeon: Serafina Mitchell, MD;  [facility];  Service: Vascular;  Laterality: Right;  . AV FISTULA PLACEMENT Left 09/04/2017   Procedure: ARTERIOVENOUS (AV) FISTULA CREATION LEFT UPPER ARM;  Surgeon: Serafina [place], MD;  Location: [place];  Service: Vascular;  Laterality: Left;  . BACK SURGERY    . BASCILIC VEIN TRANSPOSITION Left 11/20/2017   Procedure: SECOND STAGE BASILIC VEIN TRANSPOSITION LEFT ARM;  Surgeon: Serafina Mitchell, MD;  Location: [place];  Service: Vascular;  Laterality: Left;  . BIOPSY  12/17/2016   Procedure: BIOPSY;  Surgeon: Daneil Dolin, MD;  [facility];  Service: Gastroenterology;;  gastric colon  .  BIOPSY  04/29/2017   Procedure: BIOPSY;  Surgeon: Daneil Dolin, MD;  [facility];  Service: Endoscopy;;  duodenal biopsies  . BUNIONECTOMY Bilateral   . COLONOSCOPY  2008   Dr. Oneida Alar: rare sigmoid colon diverticulosis, internal hemorrhoids.   . COLONOSCOPY WITH PROPOFOL N/A 12/17/2016   dense left-sided diverticulosis, right colon ulcers s/p biopsy query occult NSAID use vs transient ischemia, not consistent with IBD. CMV stains negative.   Marland Kitchen ENDARTERECTOMY FEMORAL Right 08/14/2017   Procedure: RIGHT ILLIO-FEMORAL ENDARTERECTOMY;  Surgeon: Serafina [place], MD;  Location: [facility] OR;  Service: Vascular;  Laterality: Right;  . ESOPHAGEAL DILATION  12/17/2016   EGD with mild Schatzki's ring s/p dilatation, small hiatal hernia, erosive gastropathy (negative H.pylori gastritis)  . ESOPHAGOGASTRODUODENOSCOPY  2008   Dr. Oneida Alar: normal esophagus without Barrett's, antritis and duodenitis, path with H.pylori gastritis  . ESOPHAGOGASTRODUODENOSCOPY (EGD) WITH PROPOFOL N/A 12/17/2016   Procedure: ESOPHAGOGASTRODUODENOSCOPY (EGD) WITH PROPOFOL;  Surgeon: Daneil Dolin, MD;  [facility];  Service: Gastroenterology;  Laterality: N/A;  . ESOPHAGOGASTRODUODENOSCOPY (EGD) WITH PROPOFOL N/A 04/29/2017   Patchy erythema of gastric mucosa diffusely, extensive inflammatory changes in duodenum, geographic ulceration and mucosal edema present, encroaching somewhat on the lumen yet still widely patent, distal second portion of duodenum appeared abnormal, path with peptic duodenitis with ulceration  . GROIN DEBRIDEMENT Right 10/14/2017   Procedure: RIGHT GROIN AND RIGHT LOWER QUADRANT ABDOMEN DEBRIDEMENT WITH PLACEMENT OF ANTIBIOTIC BEADS;  Surgeon: Serafina [place], MD;  Location: [place];  Service: Vascular;  Laterality: Right;  . HEMATOMA EVACUATION Left 12/06/2017   Procedure: EVACUATION HEMATOMA;  Surgeon: Waynetta Sandy, MD;  Location: [place];  Service: Vascular;  Laterality: Left;  . HERNIA  REPAIR  [id number]   umbilical  . I & D EXTREMITY Right 09/21/2017   Procedure: IRRIGATION AND DEBRIDEMENT GROIN;  Surgeon: Elam Dutch, MD;  Location: [place];  Service: Vascular;  Laterality: Right;  . I & D EXTREMITY Left 11/23/2017   Procedure: Evacuation Hematoma LEFT UPPER ARM GRAFT;  Surgeon: Elam Dutch, MD;  Location: [place];  Service: Vascular;  Laterality: Left;  . INGUINAL HERNIA REPAIR Right 02/2017   [place]  . INSERTION OF DIALYSIS CATHETER Right 09/04/2017   Procedure: INSERTION OF TUNNELED  DIALYSIS CATHETER - RIGHT INTERNAL JUGULAR PLACEMENT;  Surgeon: Serafina [place], MD;  Location: [place];  Service: Vascular;  Laterality: Right;  . INSERTION OF DIALYSIS CATHETER Right 07/26/2018   Procedure: INSERTION OF DIALYSIS CATHETER RIGHT INTERNAL JUGULAR;  Surgeon: Marty Heck, MD;  Location: [place];  Service: Vascular;  Laterality: Right;  . INSERTION OF ILIAC STENT Right 08/14/2017   Procedure: INSERTION OF RIGHT COMMON ILIAC  STENT 88mm x 54mm x 130cm INSERTION OF RIGHT EXTERNAL ILIAC STENT 16mm x 22mm x 130cm INSERTION OF SUPERFICIAL FERMORAL ARTERY STENT 4mm x 50mm x 130cm;  Surgeon: Serafina [place], MD;  Location[facility] OR;  Service: Vascular;  Laterality: Right;  . IR FLUORO GUIDE CV LINE RIGHT  08/31/2017  . IR REMOVAL TUN CV CATH W/O FL  05/28/2018  . IR REMOVAL TUN CV CATH W/O FL  03/07/2019  . IR US GUIDE VASC ACCESS RIGHT  08/31/2017  . LOWER EXTREMITY ANGIOGRAPHY Right 08/11/2017   Procedure: LOWER EXTREMITY ANGIOGRAPHY;  Surgeon: Serafina [place], MD;  Location: [place] CV LAB;  Service: Cardiovascular;  Laterality: Right;  . PATCH ANGIOPLASTY Right 08/14/2017   Procedure: PATCH ANGIOPLASTY USING HEMASHIELD PATCH 0.3IN Lillie Columbia;  Surgeon: Serafina [place], MD;  [facility];  Service: Vascular;  Laterality: Right;  . PERIPHERAL VASCULAR CATHETERIZATION N/A 09/20/2014   Procedure: Abdominal Aortogram;  Surgeon: Serafina [place], MD;  Location: [place] CV LAB;  Service:  Cardiovascular;  Laterality: N/A;  . POSTERIOR FUSION LUMBAR SPINE    . TEE WITHOUT CARDIOVERSION N/A 01/12/2018   Procedure: TRANSESOPHAGEAL ECHOCARDIOGRAM (TEE);  Surgeon: Lelon Perla, MD;  Location: [facility] ENDOSCOPY;  Service: Cardiovascular;  Laterality: N/A;    Social History:  reports that he has been smoking cigarettes. He has a 11.75 pack-year smoking history. He has never used smokeless tobacco. He reports previous alcohol use. He reports previous drug use.  Allergies: No Known Allergies  Medications Prior to Admission  Medication Sig Dispense Refill  . albuterol (PROVENTIL HFA;VENTOLIN HFA) 108 (90 Base) MCG/ACT inhaler Inhale 2 puffs into the lungs every 6 (six) hours as needed for wheezing or shortness of breath.    Marland Kitchen albuterol (PROVENTIL) (2.5 MG/3ML) 0.083% nebulizer solution Inhale 3 mLs into the lungs 3 (three) times daily as needed for shortness of breath or wheezing.    Marland Kitchen BREO ELLIPTA 200-25 MCG/INH AEPB Inhale 1 puff into the lungs daily as needed (for respiratory issues.).     Marland Kitchen carvedilol (COREG) 25 MG tablet Take 25 mg by mouth 2 (two) times daily.    Marland Kitchen diltiazem (CARDIZEM) 60 MG tablet Take 1 tablet (60 mg total) by mouth every 6 (six) hours. 120 tablet 0  . gabapentin (NEURONTIN) 300 MG capsule Take 300 mg by mouth 2 (two) times daily.    . hydrALAZINE (APRESOLINE) 25 MG tablet Take 1 tablet (25 mg total) by mouth every 8 (eight) hours. 90 tablet 0  . oxyCODONE-acetaminophen (PERCOCET) 10-325 MG tablet Take 1 tablet by mouth 5 (five) times daily as needed for pain.     . pantoprazole (PROTONIX) 40 MG tablet Take 1 tablet (40 mg total) by mouth 2 (two) times daily. (Patient taking differently: Take 40 mg by mouth daily. ) 60 tablet 1  . varenicline (CHANTIX) 0.5 MG tablet Take 0.5 mg by mouth 2 (two) times daily.    Marland Kitchen apixaban (ELIQUIS) 2.5 MG TABS tablet Take 2.5 mg by mouth 2 (two) times daily.    Marland Kitchen atorvastatin (LIPITOR) 80 MG tablet Take 80 mg by mouth daily.    .  calcium acetate (PHOSLO) 667 MG capsule Take 667 mg by mouth 3 (three) times daily with meals.    . clopidogrel (PLAVIX) 75 MG tablet Take 75 mg by mouth daily.    . furosemide (LASIX) 40 MG tablet Take 40 mg by mouth daily.    . isosorbide mononitrate (IMDUR) 60 MG 24 hr tablet  Take 60 mg by mouth daily.    Marland Kitchen lisinopril (ZESTRIL) 20 MG tablet Take 20 mg by mouth daily.    . sodium bicarbonate 650 MG tablet Take 1 tablet (650 mg total) by mouth daily. (Patient not taking: Reported on 08/23/2019) 30 tablet 0     Physical Exam: Blood pressure (!) 120/102, pulse 77, temperature 97.8 F (36.6 C), temperature source Oral, resp. rate 20, height 6\' 4"  (1.93 m), weight 88.4 kg, SpO2 95 %. General: pleasant, chronically ill-appearing white male who is laying in bed in NAD HEENT: head is normocephalic, atraumatic.  Sclera are noninjected.  PERRL.  Ears and nose without any masses or lesions.  NGT in place.  Mouth is pink and dry Heart: irregularly irregular.  Normal s1,s2. No obvious murmurs, gallops, or rubs noted.  Palpable radial and pedal pulses bilaterally Lungs: diffuse bilateral wheezing.  Respiratory effort nonlabored though. Abd: distended and tight, but also in the middle of doing his dialysis session with dialysate being transferred, some tenderness but no peritonitis.  Few BS, PD catheter palpable, no masses, hernias, or organomegaly MS: all 4 extremities are symmetrical with no cyanosis, clubbing. +1 edema in B LEs Skin: warm and dry with no masses, lesions, or rashes Neuro: Cranial nerves 2-12 grossly intact, sensation is normal throughout Psych: A&Ox3 with an appropriate affect.   Results for orders placed or performed during the hospital encounter of 08/22/19 (from the past 48 hour(s))  Renal function panel     Status: Abnormal   Collection Time: 08/25/19  6:22 AM  Result Value Ref Range   Sodium 140 135 - 145 mmol/L   Potassium 3.6 3.5 - 5.1 mmol/L   Chloride 94 (L) 98 - 111 mmol/L    CO2 28 22 - 32 mmol/L   Glucose, Bld 211 (H) 70 - 99 mg/dL    Comment: Glucose reference range applies only to samples taken after fasting for at least 8 hours.   BUN 56 (H) 8 - 23 mg/dL   Creatinine, Ser 6.31 (H) 0.61 - 1.24 mg/dL   Calcium 8.2 (L) 8.9 - 10.3 mg/dL   Phosphorus 7.2 (H) 2.5 - 4.6 mg/dL   Albumin 2.2 (L) 3.5 - 5.0 g/dL   GFR calc non Af Amer 9 (L) >60 mL/min   GFR calc Af Amer 10 (L) >60 mL/min   Anion gap 18 (H) 5 - 15    Comment: Performed at [facility] [address] [place],  [address]  CBC with Differential/Platelet     Status: Abnormal   Collection Time: 08/25/19  5:40 PM  Result Value Ref Range   WBC 20.8 (H) 4.0 - 10.5 K/uL   RBC 3.95 (L) 4.22 - 5.81 MIL/uL   Hemoglobin 12.7 (L) 13.0 - 17.0 g/dL   HCT 39.2 39.0 - 52.0 %   MCV 99.2 80.0 - 100.0 fL   MCH 32.2 26.0 - 34.0 pg   MCHC 32.4 30.0 - 36.0 g/dL   RDW 14.4 11.5 - 15.5 %   Platelets 340 150 - 400 K/uL   nRBC 0.0 0.0 - 0.2 %   Neutrophils Relative % 96 %   Neutro Abs 19.9 (H) 1.7 - 7.7 K/uL   Lymphocytes Relative 2 %   Lymphs Abs 0.4 (L) 0.7 - 4.0 K/uL   Monocytes Relative 1 %   Monocytes Absolute 0.3 0.1 - 1.0 K/uL   Eosinophils Relative 0 %   Eosinophils Absolute 0.0 0.0 - 0.5 K/uL   Basophils Relative  0 %   Basophils Absolute 0.0 0.0 - 0.1 K/uL   Immature Granulocytes 1 %   Abs Immature Granulocytes 0.10 (H) 0.00 - 0.07 K/uL    Comment: Performed at [facility], [place] [address], [place], [place] [address]  Lipase, blood     Status: None   Collection Time: 08/25/19  5:40 PM  Result Value Ref Range   Lipase 23 11 - 51 U/L    Comment: Performed at [facility] [address], [place], [place] [address]  Body fluid cell count with differential     Status: Abnormal   Collection Time: 08/25/19  5:47 PM  Result Value Ref Range   Fluid Type-FCT Peritoneal    Color, Fluid COLORLESS YELLOW   Appearance, Fluid CLEAR CLEAR   Total Nucleated Cell Count, Fluid 33 0 -  1,000 cu mm   Neutrophil Count, Fluid 10 0 - 25 %   Lymphs, Fluid 45 %   Monocyte-Macrophage-Serous Fluid 45 (L) 50 - 90 %    Comment: Performed at [facility] [address] [place], [place] [address]  Renal function panel     Status: Abnormal   Collection Time: 08/26/19  6:31 AM  Result Value Ref Range   Sodium 142 135 - 145 mmol/L   Potassium 3.6 3.5 - 5.1 mmol/L   Chloride 96 (L) 98 - 111 mmol/L   CO2 29 22 - 32 mmol/L   Glucose, Bld 155 (H) 70 - 99 mg/dL    Comment: Glucose reference range applies only to samples taken after fasting for at least 8 hours.   BUN 66 (H) 8 - 23 mg/dL   Creatinine, Ser 6.89 (H) 0.61 - 1.24 mg/dL   Calcium 8.2 (L) 8.9 - 10.3 mg/dL   Phosphorus 7.3 (H) 2.5 - 4.6 mg/dL   Albumin 2.4 (L) 3.5 - 5.0 g/dL   GFR calc non Af Amer 8 (L) >60 mL/min   GFR calc Af Amer 9 (L) >60 mL/min   Anion gap 17 (H) 5 - 15    Comment: Performed at [facility] [address], [place],  [address]   CT ABDOMEN PELVIS W CONTRAST  Result Date: 08/25/2019 CLINICAL DATA:  Nausea, vomiting. EXAM: CT ABDOMEN AND PELVIS WITH CONTRAST TECHNIQUE: Multidetector CT imaging of the abdomen and pelvis was performed using the standard protocol following bolus administration of intravenous contrast. CONTRAST:  150mL OMNIPAQUE IOHEXOL 300 MG/ML  SOLN COMPARISON:  November 27, 2018. FINDINGS: Lower chest: Small left pleural effusion is noted with adjacent subsegmental atelectasis. Hepatobiliary: No gallstones or biliary dilatation is noted. Multiple cysts are again noted in the patent parenchyma. Pancreas: Unremarkable. No pancreatic ductal dilatation or surrounding inflammatory changes. Spleen: Normal in size without focal abnormality. Adrenals/Urinary Tract: Adrenal glands appear normal. Continued presence of findings consistent with severe polycystic kidney disease. No hydronephrosis or renal obstruction is noted. Urinary bladder is unremarkable. 5.4 x 5.1 cm hyperdense  abnormality is seen involving the anterior aspect of the superior pole of the left kidney which is enlarged compared to prior exam. This may represent enlarging hyperdense cyst, but possible neoplasm cannot be excluded. Further evaluation with MRI or ultrasound is recommended. Stomach/Bowel: Mild gastric distention is noted. No colonic obstruction is noted. The appendix is not visualized. Proximal small bowel dilatation is noted with possible transition zone seen in the left upper quadrant best seen on image number 41 of series 3. More distal small bowel is not dilated. This  is concerning for possible small bowel obstruction, potentially due to adhesion. Vascular/Lymphatic: Aortic atherosclerosis. No enlarged abdominal or pelvic lymph nodes. Reproductive: Prostate is unremarkable. Other: Distal tip of peritoneal dialysis catheter is noted anteriorly within the pelvis. Mild amount of free fluid is noted in the more posterior and dependent portion of the pelvis. Musculoskeletal: Extensive postsurgical and degenerative changes are seen involving the lumbar spine. No acute abnormality is noted. IMPRESSION: 1. Continued presence of findings consistent with severe polycystic kidney disease. 5.4 x 5.1 cm hyperdense abnormality is seen involving anterior aspect of superior pole of left kidney which is enlarged compared to prior exam. This may represent enlarging hyperdense cyst, but possible neoplasm cannot be excluded. Further evaluation with MRI or ultrasound is recommended. 2. Proximal small bowel dilatation is noted with possible transition zone seen in the left upper quadrant best seen on image number 41 of series 3. More distal small bowel is not dilated. This is concerning for possible small bowel obstruction, potentially due to adhesion. 3. Mild amount of free fluid is noted in the more posterior and dependent portion of the pelvis. 4. Small left pleural effusion is noted with adjacent subsegmental atelectasis.  Aortic Atherosclerosis (ICD10-I70.0). Electronically Signed   By: Marijo Conception M.D.   On: 08/25/2019 20:28   DG CHEST PORT 1 VIEW  Result Date: 08/25/2019 CLINICAL DATA:  Shortness of breath. EXAM: PORTABLE CHEST 1 VIEW COMPARISON:  Chest x-ray dated Aug 22, 2019. FINDINGS: The heart size and mediastinal contours are within normal limits. Atherosclerotic calcification of the aortic arch. Normal pulmonary vascularity. Unchanged bibasilar atelectasis/scarring. Emphysematous changes again noted. No focal consolidation, pleural effusion, or pneumothorax. No acute osseous abnormality. IMPRESSION: No active disease.  Emphysema. Electronically Signed   By: Titus Dubin M.D.   On: 08/25/2019 11:27      Assessment/Plan CHF COPD A fib Polycystic kidney disease, ESRD, on PD H/O MI CAD Hep C HLD  SBO The patient likely only has a partial obstruction as he is still passing some flatus and had a BM yesterday, but his stomach is quite dilated on his CT scan with some small bowel dilatation and a possible transition point.  He has had an NGT placed but it is not working as the wall outlet is not working.  He will likely have to be transferred to a different room so his NGT can work properly.  We will decompress him and then start him on the SBO protocol.  He would be a high risk surgical candidate if he were to fail to improve.  He will hopefully get better with conservative management.  We will following along.   FEN - NPO/NGT VTE - plavix on hold as of 5/13 ID - none currently  Henreitta Cea, [facility] Surgery 08/26/2019, 10:13 AM Please see Amion for pager number during day hours 7:00am-4:30pm or 7:00am -11:30am on weekends

## 2019-08-26 NOTE — Progress Notes (Signed)
PT Cancellation Note  Patient Details Name: Aaron [name] MRN: [id number] DOB: 09-14-1955   Cancelled Treatment:    Reason Eval/Treat Not Completed: Other (comment)(Pt refused adamantly.  Will check back Monday. )States he will get up with nursing tomorrow and that he has too many people messing with him and he needs sleep.    Godfrey Pick Banesa Tristan 08/26/2019, 10:15 AM  Kenden Brandt W,PT Acute Rehabilitation Services Pager:  [phone]  Office:  [phone]

## 2019-08-26 NOTE — Progress Notes (Signed)
NGT placed left nare without diffulculty he had positive return with dark coffee ground returns to tube. Suction set up at bedside. MD at bedside reports wall suction not suctioning full force suction writer notified Facilities to look at suction in the room see they can improve it or if we need to move the patient for better wall suction.

## 2019-08-26 NOTE — Progress Notes (Signed)
PROGRESS NOTE    Aaron [name]  TDD:[id number] DOB: 1955-06-10 DOA: 08/22/2019 PCP: Gwenlyn Saran [place]    Brief Narrative:   64 year old gentleman with medical history significant for hypertension, hyperlipidemia, chronic atrial fibrillation on Eliquis, ESRD on peritoneal dialysis presented to the emergency department for evaluation of shortness of breath.  Patient does have history of COPD, he recently quit his smoking few months ago.  He does have some wheezing at baseline.  Came to the ER with worsening shortness of breath. In the emergency room, he was comfortable, found to have A. fib with RVR started on Cardizem drip.  Transfer to [facility] for progressive bed from [facility]. 4/13: Patient started having intermittent biliary vomiting.  CT scan shows small bowel obstruction.  Surgery consulted in the morning 4/14.   Assessment & Plan:   Principal Problem:   COPD with acute exacerbation ([place]) Active Problems:   Essential hypertension   ESRD on dialysis ([place])   Exertional dyspnea   Acute exacerbation of CHF (congestive heart failure) (HCC)   Atrial fibrillation with RVR (HCC)  COPD with acute exacerbation: Some improvement in breathing symptoms.  Continue bronchodilator therapy.  Repeat chest x-ray stable. Discontinue steroids.  Discontinue azithromycin.deep breathing exercises, incentive spirometry, chest physiotherapy. Azithromycin for 5 days.   Supplemental oxygen to keep saturations more than 92%.  Start mobilizing. Will evaluate whether he needs home oxygen.  Small bowel obstruction secondary to adhesions: Patient does have history of a small bowel obstruction.  Developed intermittent biliary vomiting.  Persistent symptoms.  CT scan showed small bowel dilatation with transition point. N.p.o., NG tube to intermittent wall suction, mobility.  Discussed and consulted general surgery. Discontinued Eliquis and Plavix, last dose 5/13 a.m. Dialysis  patient, low maintenance IV fluid.  Discontinue Lasix. Leukocytosis is probably due to steroid use. Dialysate negative for active infection.  Chronic atrial fibrillation with RVR: Heart rate better acceptable.  We will continue Coreg and diltiazem.  Eliquis on hold.    ESRD on peritoneal dialysis: Followed by nephrology.  Medication adjustment as per nephrology.  DVT prophylaxis: Eliquis Code Status: Full code Family Communication: None Disposition Plan: Status is: Inpatient  Remains inpatient appropriate because:Inpatient level of care appropriate due to severity of illness   Dispo: The patient is from: Home              Anticipated d/c is to: Home              Anticipated d/c date is: 2 days              Patient currently is not medically stable to d/c.  Consultants:   Nephrology  General surgery  Procedures:   Peritoneal dialysis  Antimicrobials:   Azithromycin, 5/11--- 5/13   Subjective: Seen and examined.  Examined last night with vomiting.  CT scan with partial small bowel obstruction and dilated stomach.  He had 2 episodes of nausea with biliary vomiting overnight.  Heart rate is acceptable. Patient tells me to fix his abdomen, asked me to take him to surgery or what ever do to fix it so he does not vomit. Last bowel movement 5/12 morning. Patient stated he is farting since morning.  Objective: Vitals:   08/26/19 0454 08/26/19 0736 08/26/19 0816 08/26/19 0955  BP: 109/69 (!) 114/96  (!) 120/102  Pulse: 95 (!) 51  77  Resp: 18 18  20   Temp: 97.8 F (36.6 C) (!) 97.5 F (36.4 C)  97.8 F (36.6  C)  TempSrc: Oral Oral  Oral  SpO2: 99% 99% 98% 95%  Weight:      Height:        Intake/Output Summary (Last 24 hours) at 08/26/2019 1054 Last data filed at 08/26/2019 0815 Gross per 24 hour  Intake 440 ml  Output 450 ml  Net -10 ml   Filed Weights   08/24/19 0442 08/24/19 1039 08/25/19 0521  Weight: 91.4 kg 90 kg 88.4 kg    Examination:  General exam:  Appears calm and comfortable, chronically sick looking  .  On room air. Respiratory system: Bilateral expiratory wheezes.  Some conducted airway sounds. Cardiovascular system: S1 & S2 heard, irregularly irregular, No JVD, murmurs, rubs, gallops or clicks. 1+ bilateral pedal edema . Gastrointestinal system: Abdomen is distended.  No localized tenderness.  No organomegaly or masses felt.  No bowel sounds.  PD catheter intact. Central nervous system: Alert and oriented. No focal neurological deficits. Extremities: Symmetric 5 x 5 power. Skin: No rashes, lesions or ulcers Psychiatry: Judgement and insight appear normal. Mood & affect appropriate.     Data Reviewed: I have personally reviewed following labs and imaging studies  CBC: Recent Labs  Lab 08/22/19 1930 08/23/19 0348 08/24/19 0908 08/25/19 1740  WBC 10.8* 9.9 13.5* 20.8*  NEUTROABS  --   --   --  19.9*  HGB 12.0* 11.5* 12.1* 12.7*  HCT 37.1* 36.3* 37.6* 39.2  MCV 99.7 100.3* 99.2 99.2  PLT 210 201 266 703   Basic Metabolic Panel: Recent Labs  Lab 08/22/19 1930 08/23/19 0348 08/24/19 0908 08/25/19 0622 08/26/19 0631  NA 136 137 137 140 142  K 3.8 3.9 3.4* 3.6 3.6  CL 100 100 99 94* 96*  CO2 21* 23 25 28 29   GLUCOSE 98 131* 167* 211* 155*  BUN 58* 57* 55* 56* 66*  CREATININE 6.79* 6.87* 6.66* 6.31* 6.89*  CALCIUM 7.5* 7.3* 8.0* 8.2* 8.2*  MG 1.7  --   --   --   --   PHOS  --  7.1* 6.2* 7.2* 7.3*   GFR: Estimated Creatinine Clearance: 13.5 mL/min (A) (by C-G formula based on SCr of 6.89 mg/dL (H)). Liver Function Tests: Recent Labs  Lab 08/24/19 0908 08/25/19 0622 08/26/19 0631  ALBUMIN 2.2* 2.2* 2.4*   Recent Labs  Lab 08/25/19 1740  LIPASE 23   No results for input(s): AMMONIA in the last 168 hours. Coagulation Profile: No results for input(s): INR, PROTIME in the last 168 hours. Cardiac Enzymes: No results for input(s): CKTOTAL, CKMB, CKMBINDEX, TROPONINI in the last 168 hours. BNP (last 3  results) No results for input(s): PROBNP in the last 8760 hours. HbA1C: No results for input(s): HGBA1C in the last 72 hours. CBG: No results for input(s): GLUCAP in the last 168 hours. Lipid Profile: No results for input(s): CHOL, HDL, LDLCALC, TRIG, CHOLHDL, LDLDIRECT in the last 72 hours. Thyroid Function Tests: No results for input(s): TSH, T4TOTAL, FREET4, T3FREE, THYROIDAB in the last 72 hours. Anemia Panel: No results for input(s): VITAMINB12, FOLATE, FERRITIN, TIBC, IRON, RETICCTPCT in the last 72 hours. Sepsis Labs: No results for input(s): PROCALCITON, LATICACIDVEN in the last 168 hours.  Recent Results (from the past 240 hour(s))  SARS Coronavirus 2 by RT PCR (hospital order, performed in [facility] hospital lab) Nasopharyngeal Nasopharyngeal Swab     Status: None   Collection Time: 08/22/19  8:53 PM   Specimen: Nasopharyngeal Swab  Result Value Ref Range Status   SARS Coronavirus 2 NEGATIVE  NEGATIVE Final    Comment: (NOTE) SARS-CoV-2 target nucleic acids are NOT DETECTED. The SARS-CoV-2 RNA is generally detectable in upper and lower respiratory specimens during the acute phase of infection. The lowest concentration of SARS-CoV-2 viral copies this assay can detect is 250 copies / mL. A negative result does not preclude SARS-CoV-2 infection and should not be used as the sole basis for treatment or other patient management decisions.  A negative result may occur with improper specimen collection / handling, submission of specimen other than nasopharyngeal swab, presence of viral mutation(s) within the areas targeted by this assay, and inadequate number of viral copies (<250 copies / mL). A negative result must be combined with clinical observations, patient history, and epidemiological information. Fact Sheet for Patients:   StrictlyIdeas.no Fact Sheet for Healthcare Providers: BankingDealers.co.za This test is not yet  approved or cleared  by the [place] FDA and has been authorized for detection and/or diagnosis of SARS-CoV-2 by FDA under an Emergency Use Authorization (EUA).  This EUA will remain in effect (meaning this test can be used) for the duration of the COVID-19 declaration under Section 564(b)(1) of the Act, 21 U.S.C. section 360bbb-3(b)(1), unless the authorization is terminated or revoked sooner. Performed at [facility], [address] [place], [place] [address]   Culture, blood (routine x 2)     Status: None (Preliminary result)   Collection Time: 08/22/19  9:34 PM   Specimen: BLOOD RIGHT ARM  Result Value Ref Range Status   Specimen Description BLOOD RIGHT ARM  Final   Special Requests   Final    BOTTLES DRAWN AEROBIC AND ANAEROBIC Blood Culture adequate volume   Culture   Final    NO GROWTH 4 DAYS Performed at [facility], [address] [place], [place] [address]    Report Status PENDING  Incomplete  Culture, blood (routine x 2)     Status: None (Preliminary result)   Collection Time: 08/22/19  9:35 PM   Specimen: BLOOD RIGHT ARM  Result Value Ref Range Status   Specimen Description BLOOD RIGHT ARM  Final   Special Requests   Final    BOTTLES DRAWN AEROBIC AND ANAEROBIC Blood Culture adequate volume   Culture   Final    NO GROWTH 4 DAYS Performed at [facility], [address] [place], [place] [address]    Report Status PENDING  Incomplete         Radiology Studies: CT ABDOMEN PELVIS W CONTRAST  Result Date: 08/25/2019 CLINICAL DATA:  Nausea, vomiting. EXAM: CT ABDOMEN AND PELVIS WITH CONTRAST TECHNIQUE: Multidetector CT imaging of the abdomen and pelvis was performed using the standard protocol following bolus administration of intravenous contrast. CONTRAST:  124mL OMNIPAQUE IOHEXOL 300 MG/ML  SOLN COMPARISON:  November 27, 2018. FINDINGS: Lower chest: Small left pleural effusion is noted with adjacent subsegmental atelectasis. Hepatobiliary: No gallstones or  biliary dilatation is noted. Multiple cysts are again noted in the patent parenchyma. Pancreas: Unremarkable. No pancreatic ductal dilatation or surrounding inflammatory changes. Spleen: Normal in size without focal abnormality. Adrenals/Urinary Tract: Adrenal glands appear normal. Continued presence of findings consistent with severe polycystic kidney disease. No hydronephrosis or renal obstruction is noted. Urinary bladder is unremarkable. 5.4 x 5.1 cm hyperdense abnormality is seen involving the anterior aspect of the superior pole of the left kidney which is enlarged compared to prior exam. This may represent enlarging hyperdense cyst, but possible neoplasm cannot be excluded. Further evaluation with MRI or ultrasound is recommended. Stomach/Bowel: Mild  gastric distention is noted. No colonic obstruction is noted. The appendix is not visualized. Proximal small bowel dilatation is noted with possible transition zone seen in the left upper quadrant best seen on image number 41 of series 3. More distal small bowel is not dilated. This is concerning for possible small bowel obstruction, potentially due to adhesion. Vascular/Lymphatic: Aortic atherosclerosis. No enlarged abdominal or pelvic lymph nodes. Reproductive: Prostate is unremarkable. Other: Distal tip of peritoneal dialysis catheter is noted anteriorly within the pelvis. Mild amount of free fluid is noted in the more posterior and dependent portion of the pelvis. Musculoskeletal: Extensive postsurgical and degenerative changes are seen involving the lumbar spine. No acute abnormality is noted. IMPRESSION: 1. Continued presence of findings consistent with severe polycystic kidney disease. 5.4 x 5.1 cm hyperdense abnormality is seen involving anterior aspect of superior pole of left kidney which is enlarged compared to prior exam. This may represent enlarging hyperdense cyst, but possible neoplasm cannot be excluded. Further evaluation with MRI or ultrasound  is recommended. 2. Proximal small bowel dilatation is noted with possible transition zone seen in the left upper quadrant best seen on image number 41 of series 3. More distal small bowel is not dilated. This is concerning for possible small bowel obstruction, potentially due to adhesion. 3. Mild amount of free fluid is noted in the more posterior and dependent portion of the pelvis. 4. Small left pleural effusion is noted with adjacent subsegmental atelectasis. Aortic Atherosclerosis (ICD10-I70.0). Electronically Signed   By: Marijo Conception M.D.   On: 08/25/2019 20:28   DG CHEST PORT 1 VIEW  Result Date: 08/25/2019 CLINICAL DATA:  Shortness of breath. EXAM: PORTABLE CHEST 1 VIEW COMPARISON:  Chest x-ray dated Aug 22, 2019. FINDINGS: The heart size and mediastinal contours are within normal limits. Atherosclerotic calcification of the aortic arch. Normal pulmonary vascularity. Unchanged bibasilar atelectasis/scarring. Emphysematous changes again noted. No focal consolidation, pleural effusion, or pneumothorax. No acute osseous abnormality. IMPRESSION: No active disease.  Emphysema. Electronically Signed   By: Titus Dubin M.D.   On: 08/25/2019 11:27        Scheduled Meds: . atorvastatin  80 mg Oral Daily  . budesonide (PULMICORT) nebulizer solution  0.25 mg Nebulization BID  . calcium acetate  1,334 mg Oral TID WC  . carvedilol  25 mg Oral BID  . Chlorhexidine Gluconate Cloth  6 each Topical Daily  . diatrizoate meglumine-sodium  90 mL Per NG tube Once  . diltiazem  60 mg Oral Q6H  . feeding supplement (NEPRO CARB STEADY)  237 mL Oral BID BM  . gabapentin  300 mg Oral QHS  . gentamicin cream  1 application Topical Daily  . guaiFENesin  600 mg Oral BID  . ipratropium-albuterol  3 mL Nebulization BID  . isosorbide mononitrate  60 mg Oral Daily  . pantoprazole (PROTONIX) IV  40 mg Intravenous Q12H   Continuous Infusions: . sodium chloride 75 mL/hr at 08/26/19 0936     LOS: 4 days     Time spent: 25 minutes    Barb Merino, MD [facility] Pager [phone]

## 2019-08-27 ENCOUNTER — Inpatient Hospital Stay (HOSPITAL_COMMUNITY): Payer: 59

## 2019-08-27 LAB — CBC WITH DIFFERENTIAL/PLATELET
Abs Immature Granulocytes: 0.2 10*3/uL — ABNORMAL HIGH (ref 0.00–0.07)
Basophils Absolute: 0 10*3/uL (ref 0.0–0.1)
Basophils Relative: 0 %
Eosinophils Absolute: 0 10*3/uL (ref 0.0–0.5)
Eosinophils Relative: 0 %
HCT: 39.9 % (ref 39.0–52.0)
Hemoglobin: 12.9 g/dL — ABNORMAL LOW (ref 13.0–17.0)
Immature Granulocytes: 1 %
Lymphocytes Relative: 6 %
Lymphs Abs: 1 10*3/uL (ref 0.7–4.0)
MCH: 32.9 pg (ref 26.0–34.0)
MCHC: 32.3 g/dL (ref 30.0–36.0)
MCV: 101.8 fL — ABNORMAL HIGH (ref 80.0–100.0)
Monocytes Absolute: 1 10*3/uL (ref 0.1–1.0)
Monocytes Relative: 6 %
Neutro Abs: 14.1 10*3/uL — ABNORMAL HIGH (ref 1.7–7.7)
Neutrophils Relative %: 87 %
Platelets: 276 10*3/uL (ref 150–400)
RBC: 3.92 MIL/uL — ABNORMAL LOW (ref 4.22–5.81)
RDW: 14.5 % (ref 11.5–15.5)
WBC: 16.3 10*3/uL — ABNORMAL HIGH (ref 4.0–10.5)
nRBC: 0 % (ref 0.0–0.2)

## 2019-08-27 LAB — COMPREHENSIVE METABOLIC PANEL
ALT: 12 U/L (ref 0–44)
AST: 18 U/L (ref 15–41)
Albumin: 2.1 g/dL — ABNORMAL LOW (ref 3.5–5.0)
Alkaline Phosphatase: 78 U/L (ref 38–126)
Anion gap: 14 (ref 5–15)
BUN: 67 mg/dL — ABNORMAL HIGH (ref 8–23)
CO2: 30 mmol/L (ref 22–32)
Calcium: 7.6 mg/dL — ABNORMAL LOW (ref 8.9–10.3)
Chloride: 99 mmol/L (ref 98–111)
Creatinine, Ser: 6.8 mg/dL — ABNORMAL HIGH (ref 0.61–1.24)
GFR calc Af Amer: 9 mL/min — ABNORMAL LOW (ref >60)
GFR calc non Af Amer: 8 mL/min — ABNORMAL LOW (ref >60)
Glucose, Bld: 93 mg/dL (ref 70–99)
Potassium: 3.9 mmol/L (ref 3.5–5.1)
Sodium: 143 mmol/L (ref 135–145)
Total Bilirubin: 1 mg/dL (ref 0.3–1.2)
Total Protein: 5.6 g/dL — ABNORMAL LOW (ref 6.5–8.1)

## 2019-08-27 LAB — PHOSPHORUS: Phosphorus: 7.2 mg/dL — ABNORMAL HIGH (ref 2.5–4.6)

## 2019-08-27 LAB — CULTURE, BLOOD (ROUTINE X 2)
Culture: NO GROWTH
Culture: NO GROWTH
Special Requests: ADEQUATE
Special Requests: ADEQUATE

## 2019-08-27 LAB — PATHOLOGIST SMEAR REVIEW

## 2019-08-27 LAB — MAGNESIUM: Magnesium: 1.7 mg/dL (ref 1.7–2.4)

## 2019-08-27 MED ORDER — BISACODYL 10 MG RE SUPP
10.0000 mg | Freq: Once | RECTAL | Status: DC
  Filled 2019-08-27 (×2): qty 1

## 2019-08-27 NOTE — Progress Notes (Signed)
Called the dialysis nurse for PD treatment but he reports that he has nobody to watch other patients on treatment, so he verbalized that the treatment will be done in the morning.

## 2019-08-27 NOTE — Progress Notes (Signed)
[facility] Progress Note     Subjective: Patient reports no flatus or BM since yesterday. Denies nausea with NGT clamped. Reports that abdomen feels more normal to him. Has not mobilized since 5/13. Denies abdominal pain.   Objective: Vital signs in last 24 hours: Temp:  [97 F (36.1 C)-98.4 F (36.9 C)] 98.4 F (36.9 C) (05/15 0542) Pulse Rate:  [42-103] 58 (05/15 0824) Resp:  [18-20] 20 (05/15 0824) BP: (116-152)/(82-109) 123/88 (05/15 0824) SpO2:  [90 %-99 %] 94 % (05/15 0824) Weight:  [80.1 kg-80.8 kg] 80.1 kg (05/15 0542) Last BM Date: 08/23/19  Intake/Output from previous day: 05/14 0701 - 05/15 0700 In: 2796.7 [P.O.:480; I.V.:1076.7; NG/GT:1240] Out: 2500 [Urine:500; Emesis/NG output:2000] Intake/Output this shift: No intake/output data recorded.  PE: General: chronically ill appearing white male who is laying in bed in NAD Heart: regular, rate, and rhythm.  Normal s1,s2. No obvious murmurs, gallops, or rubs noted.  Palpable radial and pedal pulses bilaterally Lungs: CTAB, no wheezes, rhonchi, or rales noted.  Respiratory effort nonlabored Abd: soft, NT, moderately distended, BS hypoactive, PD catheter palpable    Lab Results:  Recent Labs    08/25/19 1740 08/27/19 0526  WBC 20.8* 16.3*  HGB 12.7* 12.9*  HCT 39.2 39.9  PLT 340 276   BMET Recent Labs    08/26/19 0631 08/27/19 0526  NA 142 143  K 3.6 3.9  CL 96* 99  CO2 29 30  GLUCOSE 155* 93  BUN 66* 67*  CREATININE 6.89* 6.80*  CALCIUM 8.2* 7.6*   PT/INR No results for input(s): LABPROT, INR in the last 72 hours. CMP     Component Value Date/Time   NA 143 08/27/2019 0526   K 3.9 08/27/2019 0526   CL 99 08/27/2019 0526   CO2 30 08/27/2019 0526   GLUCOSE 93 08/27/2019 0526   BUN 67 (H) 08/27/2019 0526   CREATININE 6.80 (H) 08/27/2019 0526   CALCIUM 7.6 (L) 08/27/2019 0526   PROT 5.6 (L) 08/27/2019 0526   ALBUMIN 2.1 (L) 08/27/2019 0526   AST 18 08/27/2019 0526   ALT 12  08/27/2019 0526   ALKPHOS 78 08/27/2019 0526   BILITOT 1.0 08/27/2019 0526   GFRNONAA 8 (L) 08/27/2019 0526   GFRAA 9 (L) 08/27/2019 0526   Lipase     Component Value Date/Time   LIPASE 23 08/25/2019 1740       Studies/Results: DG Abd 1 View  Result Date: 08/26/2019 CLINICAL DATA:  Small bowel obstruction. EXAM: ABDOMEN - 1 VIEW COMPARISON:  CT abdomen pelvis from yesterday. FINDINGS: Enteric tube in the stomach. Peritoneal dialysis catheter in the pelvis. Multiple dilated loops of small bowel measuring up to 5.0 cm in diameter, progressed from yesterday's CT. No radio-opaque calculi or other significant radiographic abnormality are seen. No acute osseous abnormality. Right iliac artery stents. IMPRESSION: 1. Mildly worsened small bowel obstruction. Electronically Signed   By: Titus Dubin M.D.   On: 08/26/2019 12:01   CT ABDOMEN PELVIS W CONTRAST  Result Date: 08/25/2019 CLINICAL DATA:  Nausea, vomiting. EXAM: CT ABDOMEN AND PELVIS WITH CONTRAST TECHNIQUE: Multidetector CT imaging of the abdomen and pelvis was performed using the standard protocol following bolus administration of intravenous contrast. CONTRAST:  182mL OMNIPAQUE IOHEXOL 300 MG/ML  SOLN COMPARISON:  November 27, 2018. FINDINGS: Lower chest: Small left pleural effusion is noted with adjacent subsegmental atelectasis. Hepatobiliary: No gallstones or biliary dilatation is noted. Multiple cysts are again noted in the patent parenchyma. Pancreas: Unremarkable. No pancreatic ductal  dilatation or surrounding inflammatory changes. Spleen: Normal in size without focal abnormality. Adrenals/Urinary Tract: Adrenal glands appear normal. Continued presence of findings consistent with severe polycystic kidney disease. No hydronephrosis or renal obstruction is noted. Urinary bladder is unremarkable. 5.4 x 5.1 cm hyperdense abnormality is seen involving the anterior aspect of the superior pole of the left kidney which is enlarged compared to  prior exam. This may represent enlarging hyperdense cyst, but possible neoplasm cannot be excluded. Further evaluation with MRI or ultrasound is recommended. Stomach/Bowel: Mild gastric distention is noted. No colonic obstruction is noted. The appendix is not visualized. Proximal small bowel dilatation is noted with possible transition zone seen in the left upper quadrant best seen on image number 41 of series 3. More distal small bowel is not dilated. This is concerning for possible small bowel obstruction, potentially due to adhesion. Vascular/Lymphatic: Aortic atherosclerosis. No enlarged abdominal or pelvic lymph nodes. Reproductive: Prostate is unremarkable. Other: Distal tip of peritoneal dialysis catheter is noted anteriorly within the pelvis. Mild amount of free fluid is noted in the more posterior and dependent portion of the pelvis. Musculoskeletal: Extensive postsurgical and degenerative changes are seen involving the lumbar spine. No acute abnormality is noted. IMPRESSION: 1. Continued presence of findings consistent with severe polycystic kidney disease. 5.4 x 5.1 cm hyperdense abnormality is seen involving anterior aspect of superior pole of left kidney which is enlarged compared to prior exam. This may represent enlarging hyperdense cyst, but possible neoplasm cannot be excluded. Further evaluation with MRI or ultrasound is recommended. 2. Proximal small bowel dilatation is noted with possible transition zone seen in the left upper quadrant best seen on image number 41 of series 3. More distal small bowel is not dilated. This is concerning for possible small bowel obstruction, potentially due to adhesion. 3. Mild amount of free fluid is noted in the more posterior and dependent portion of the pelvis. 4. Small left pleural effusion is noted with adjacent subsegmental atelectasis. Aortic Atherosclerosis (ICD10-I70.0). Electronically Signed   By: Marijo Conception M.D.   On: 08/25/2019 20:28   DG Abd  Portable 1V-Small Bowel Obstruction Protocol-initial, 8 hr delay  Result Date: 08/26/2019 CLINICAL DATA:  9 hour delay, small-bowel protocol EXAM: PORTABLE ABDOMEN - 1 VIEW COMPARISON:  Radiograph 08/26/2019, CT 08/25/2019 FINDINGS: Multiple stacked air distended loops of small bowel are present. High-density contrast appears largely retained within the distended gastric lumen. Transesophageal tube tip and side port remain within the gastric body. No evidence of free intraperitoneal air. Basilar atelectatic changes. Additional support devices overlie the chest and abdomen. Foley catheter in place. Degenerative changes in the spine, hips and pelvis. IMPRESSION: Retention of contrast within the distended gastric lumen. Persistently dilated loops of small bowel compatible with obstruction or ileus though the lack of transit is somewhat suggestive of adynamic bowel motility. Electronically Signed   By: Lovena Le M.D.   On: 08/26/2019 21:21    Anti-infectives: Anti-infectives (From admission, onward)   Start     Dose/Rate Route Frequency Ordered Stop   08/23/19 2200  azithromycin (ZITHROMAX) tablet 500 mg  Status:  Discontinued    Note to Pharmacy: Pharmacy to adjust timing for next dose.   500 mg Oral Daily at bedtime 08/23/19 0233 08/25/19 2046   08/22/19 2130  azithromycin (ZITHROMAX) 500 mg in sodium chloride 0.9 % 250 mL IVPB     500 mg 250 mL/hr over 60 Minutes Intravenous  Once 08/22/19 2122 08/22/19 2245  Assessment/Plan CHF COPD A fib Polycystic kidney disease, ESRD, on PD H/O MI CAD Hep C HLD  SBO - NGT clamped currently and patient not having any bowel function  - 8h delay film shows persistent sbo, repeat film at noon today - mobilize today, try suppository - await return in bowel function  - He would be a high risk surgical candidate if he were to fail to improve.  He will hopefully get better with conservative management.  We will following along.  FEN -  NPO/NGT VTE - plavix on hold as of 5/13 ID - none currently  LOS: 5 days    Norm Parcel , [facility] 08/27/2019, 9:55 AM Please see Amion for pager number during day hours 7:00am-4:30pm

## 2019-08-27 NOTE — Evaluation (Signed)
Occupational Therapy Evaluation Patient Details Name: Aaron [name] MRN: [id number] DOB: 1956-02-14 Today's Date: 08/27/2019    History of Present Illness Pt is a 64 y/o male admitted secondary to worsening SOB thought to be from COPD exacerbation. Pt also with a fib and RVR. PMH includes HTN, COPD, ESRD on PD, CHF, and a fib.    Clinical Impression   Pt admitted with see above. Pt currently with functional limitations due to the deficits listed below (see OT Problem List). Pt lives alone but has family who can come in and out some when returning home. Pt able to complete UE/LE dressing tasks with supervision with cues on compensation due to abdominal pain. Pt at this time cued for transfers for safety with RW. Pt will benefit from skilled OT to increase their safety and independence with ADL and functional mobility for ADL to facilitate discharge to venue listed below.       Follow Up Recommendations  Home health OT;Supervision - Intermittent    Equipment Recommendations       Recommendations for Other Services       Precautions / Restrictions Precautions Precautions: Fall Precaution Comments: NG tube Restrictions Weight Bearing Restrictions: No      Mobility Bed Mobility Overal bed mobility: Needs Assistance Bed Mobility: Sit to Supine;Rolling;Supine to Sit Rolling: Modified independent (Device/Increase time)   Supine to sit: Modified independent (Device/Increase time) Sit to supine: Modified independent (Device/Increase time)   General bed mobility comments: cue for hand placement  Transfers Overall transfer level: Needs assistance Equipment used: Rolling walker (2 wheeled) Transfers: Sit to/from Stand Sit to Stand: Modified independent (Device/Increase time)              Balance Overall balance assessment: Needs assistance Sitting-balance support: No upper extremity supported Sitting balance-Leahy Scale: Good     Standing balance support: Bilateral  upper extremity supported;During functional activity Standing balance-Leahy Scale: Fair                             ADL either performed or assessed with clinical judgement   ADL Overall ADL's : Needs assistance/impaired Eating/Feeding: Independent;Sitting   Grooming: Wash/dry hands;Wash/dry face;Modified independent;Standing   Upper Body Bathing: Supervision/ safety;Sitting;Standing;Cueing for safety;Cueing for sequencing   Lower Body Bathing: Supervison/ safety;Cueing for safety;Cueing for sequencing;Sit to/from stand   Upper Body Dressing : Supervision/safety;Cueing for sequencing;Cueing for safety;Sitting;Standing   Lower Body Dressing: Supervision/safety;Cueing for safety;Cueing for sequencing;Sit to/from stand   Toilet Transfer: Supervision/safety;Cueing for safety;Cueing for sequencing;RW   Toileting- Water quality scientist and Hygiene: Supervision/safety;Sit to/from stand   Tub/ Shower Transfer: Min guard;Shower seat;Grab bars;Rolling walker   Functional mobility during ADLs: Min guard;Rolling walker       Vision Baseline Vision/History: Wears glasses Wears Glasses: Reading only Patient Visual Report: No change from baseline Vision Assessment?: No apparent visual deficits     Perception Perception Perception Tested?: No   Praxis Praxis Praxis tested?: Within functional limits    Pertinent Vitals/Pain Pain Assessment: Faces Faces Pain Scale: Hurts little more Pain Location: abdomen  Pain Descriptors / Indicators: Aching Pain Intervention(s): Limited activity within patient's tolerance;Monitored during session;Repositioned     Hand Dominance Right   Extremity/Trunk Assessment Upper Extremity Assessment Upper Extremity Assessment: Overall WFL for tasks assessed   Lower Extremity Assessment Lower Extremity Assessment: Defer to PT evaluation   Cervical / Trunk Assessment Cervical / Trunk Assessment: Normal   Communication  Communication Communication: No difficulties   Cognition  Arousal/Alertness: Awake/alert Behavior During Therapy: WFL for tasks assessed/performed Overall Cognitive Status: Within Functional Limits for tasks assessed                                     General Comments       Exercises     Shoulder Instructions      Home Living Family/patient expects to be discharged to:: Private residence Living Arrangements: Alone Available Help at Discharge: Family Type of Home: Apartment Home Access: Level entry     Home Layout: One level     Bathroom Shower/Tub: Teacher, early years/pre: Standard Bathroom Accessibility: Yes How Accessible: Accessible via walker Home Equipment: [place] - single point;Grab bars - tub/shower;Grab bars - toilet;Bedside commode;Shower seat;Wheelchair - [employer] - 4 wheels;Walker - 2 wheels;Walker - standard   Additional Comments: per pt they were driving and they have family to come in and out to assist some      Prior Functioning/Environment Level of Independence: Independent with assistive device(s)        Comments: SPC vs RW        OT Problem List: Decreased strength;Decreased activity tolerance;Impaired balance (sitting and/or standing);Decreased knowledge of use of DME or AE;Decreased safety awareness;Pain      OT Treatment/Interventions: Self-care/ADL training;Therapeutic exercise;Therapeutic activities;Patient/family education;Balance training    OT Goals(Current goals can be found in the care plan section) Acute Rehab OT Goals Patient Stated Goal: to get stronger OT Goal Formulation: With patient Time For Goal Achievement: 09/17/19 Potential to Achieve Goals: Good ADL Goals Pt Will Transfer to Toilet: with modified independence;ambulating Pt Will Perform Tub/Shower Transfer: with modified independence;ambulating;rolling walker  OT Frequency: Min 2X/week   Barriers to D/C:            Co-evaluation               AM-PAC OT "6 Clicks" Daily Activity     Outcome Measure Help from another person eating meals?: None Help from another person taking care of personal grooming?: None Help from another person toileting, which includes using toliet, bedpan, or urinal?: A Little Help from another person bathing (including washing, rinsing, drying)?: A Little Help from another person to put on and taking off regular upper body clothing?: None Help from another person to put on and taking off regular lower body clothing?: A Little 6 Click Score: 21   End of Session Equipment Utilized During Treatment: Gait belt;Rolling walker  Activity Tolerance: Patient tolerated treatment well Patient left: in bed;with call bell/phone within reach;with bed alarm set  OT Visit Diagnosis: Unsteadiness on feet (R26.81);Other abnormalities of gait and mobility (R26.89);Repeated falls (R29.6);Muscle weakness (generalized) (M62.81);Pain Pain - part of body: (abdomen)                Time: 3976-7341 OT Time Calculation (min): 34 min Charges:  OT General Charges $OT Visit: 1 Visit OT Evaluation $OT Eval Low Complexity: 1 Low OT Treatments $Self Care/Home Management : 8-22 mins  Joeseph Amor OTR/L  Acute Rehab Services  [phone] office number [phone] pager number   Joeseph Amor 08/27/2019, 12:47 PM

## 2019-08-27 NOTE — Progress Notes (Signed)
PROGRESS NOTE    Aaron [name]  [name] DOB: 06-Jan-1956 DOA: 08/22/2019 PCP: [facility]    Brief Narrative:   64 year old gentleman with medical history significant for hypertension, hyperlipidemia, chronic atrial fibrillation on Eliquis, ESRD on peritoneal dialysis presented to the emergency department for evaluation of shortness of breath.  Patient does have history of COPD, he recently quit his smoking few months ago.  He does have some wheezing at baseline.  Came to the ER with worsening shortness of breath. In the emergency room, he was comfortable, found to have A. fib with RVR started on Cardizem drip.  Transfer to [facility] for progressive bed from [facility] 4/13: Patient started having intermittent biliary vomiting.  CT scan shows small bowel obstruction.  Surgery consulted in the morning 4/14.   Assessment & Plan:   Principal Problem:   COPD with acute exacerbation ([place]) Active Problems:   Essential hypertension   ESRD on dialysis ([place])   Exertional dyspnea   Acute exacerbation of CHF (congestive heart failure) (H[facility])   Atrial fibrillation with RVR ([facility])  COPD with acute exacerbation: Mostly improved.  continue bronchodilator therapy.  Repeat chest x-ray stable.  Finished steroid and antibiotics. Mobility, breathing exercises, incentive spirometry, chest physiotherapy. Supplemental oxygen to keep saturations more than 92%.  Will evaluate whether he needs home oxygen when he is near discharge.  Small bowel obstruction secondary to adhesions: Patient does have history of a small bowel obstruction.  Developed intermittent biliary vomiting.  Persistent symptoms.  CT scan showed small bowel dilatation with transition point. N.p.o., NG tube to wall suction, mobility.   Followed by surgery. Discontinued Eliquis and Plavix, last dose 5/13 a.m. Dialysis patient, low maintenance IV fluid.  Discontinued Lasix. Leukocytosis  improving. Dialysate negative for active infection.  Chronic atrial fibrillation with RVR: Heart rate better acceptable.  We will continue Coreg and diltiazem.  Eliquis on hold.    ESRD on peritoneal dialysis: Followed by nephrology.  Medication adjustment as per nephrology.  DVT prophylaxis: SCDs.  Eliquis on hold. Code Status: Full code Family Communication: Patient's daughter called and updated. Disposition Plan: Status is: Inpatient  Remains inpatient appropriate because:Inpatient level of care appropriate due to severity of illness   Dispo: The patient is from: Home              Anticipated d/c is to: Home              Anticipated d/c date is: 2 days              Patient currently is not medically stable to d/c.  Patient has small bowel obstruction, needing active inpatient treatment and management.  Consultants:   Nephrology  General surgery  Procedures:   Peritoneal dialysis  Antimicrobials:   Azithromycin, 5/11--- 5/13   Subjective: Seen and examined.  No overnight events.  He slept well with Ambien.  Medications were given and NGT was clamped. He feels relieved after a lot of NG tube output.  He is very feels soft.  No flatus today.  Hungry and wants to drink some tea. More than 2 L NG output.  Drinking about 1 cup of ice water a day. Objective: Vitals:   08/27/19 0017 08/27/19 0151 08/27/19 0542 08/27/19 0824  BP: (!) 152/104 129/84 (!) 142/109 123/88  Pulse: (!) 103 (!) 42 82 (!) 58  Resp: 18 18 18 20   Temp: 98.2 F (36.8 C) 97.8 F (36.6 C) 98.4 F (36.9 C)  TempSrc: Oral Oral Oral   SpO2: 99% 98% 98% 94%  Weight:   80.1 kg   Height:        Intake/Output Summary (Last 24 hours) at 08/27/2019 1009 Last data filed at 08/27/2019 0542 Gross per 24 hour  Intake 2796.72 ml  Output 2500 ml  Net 296.72 ml   Filed Weights   08/25/19 0521 08/26/19 1355 08/27/19 0542  Weight: 88.4 kg 80.8 kg 80.1 kg    Examination:  General exam: Appears calm and  comfortable, chronically sick looking  .  Today on room air. Respiratory system: Bilateral clear.  No added sounds. Cardiovascular system: S1 & S2 heard, irregularly irregular, No JVD, murmurs, rubs, gallops or clicks. 1+ bilateral pedal edema . Gastrointestinal system: Abdomen is soft.  Nontender.  Sluggish bowel sound present.  PD catheter intact. Central nervous system: Alert and oriented. No focal neurological deficits. Extremities: Symmetric 5 x 5 power. Skin: No rashes, lesions or ulcers Psychiatry: Judgement and insight appear normal. Mood & affect appropriate.     Data Reviewed: I have personally reviewed following labs and imaging studies  CBC: Recent Labs  Lab 08/22/19 1930 08/23/19 0348 08/24/19 0908 08/25/19 1740 08/27/19 0526  WBC 10.8* 9.9 13.5* 20.8* 16.3*  NEUTROABS  --   --   --  19.9* 14.1*  HGB 12.0* 11.5* 12.1* 12.7* 12.9*  HCT 37.1* 36.3* 37.6* 39.2 39.9  MCV 99.7 100.3* 99.2 99.2 101.8*  PLT 210 201 266 340 160   Basic Metabolic Panel: Recent Labs  Lab 08/22/19 1930 08/22/19 1930 08/23/19 0348 08/24/19 0908 08/25/19 0622 08/26/19 0631 08/27/19 0526  NA 136   < > 137 137 140 142 143  K 3.8   < > 3.9 3.4* 3.6 3.6 3.9  CL 100   < > 100 99 94* 96* 99  CO2 21*   < > 23 25 28 29 30   GLUCOSE 98   < > 131* 167* 211* 155* 93  BUN 58*   < > 57* 55* 56* 66* 67*  CREATININE 6.79*   < > 6.87* 6.66* 6.31* 6.89* 6.80*  CALCIUM 7.5*   < > 7.3* 8.0* 8.2* 8.2* 7.6*  MG 1.7  --   --   --   --   --  1.7  PHOS  --   --  7.1* 6.2* 7.2* 7.3* 7.2*   < > = values in this interval not displayed.   GFR: Estimated Creatinine Clearance: 12.6 mL/min (A) (by C-G formula based on SCr of 6.8 mg/dL (H)). Liver Function Tests: Recent Labs  Lab 08/24/19 0908 08/25/19 0622 08/26/19 0631 08/27/19 0526  AST  --   --   --  18  ALT  --   --   --  12  ALKPHOS  --   --   --  78  BILITOT  --   --   --  1.0  PROT  --   --   --  5.6*  ALBUMIN 2.2* 2.2* 2.4* 2.1*   Recent  Labs  Lab 08/25/19 1740  LIPASE 23   No results for input(s): AMMONIA in the last 168 hours. Coagulation Profile: No results for input(s): INR, PROTIME in the last 168 hours. Cardiac Enzymes: No results for input(s): CKTOTAL, CKMB, CKMBINDEX, TROPONINI in the last 168 hours. BNP (last 3 results) No results for input(s): PROBNP in the last 8760 hours. HbA1C: No results for input(s): HGBA1C in the last 72 hours. CBG: No results for input(s): GLUCAP in  the last 168 hours. Lipid Profile: No results for input(s): CHOL, HDL, LDLCALC, TRIG, CHOLHDL, LDLDIRECT in the last 72 hours. Thyroid Function Tests: No results for input(s): TSH, T4TOTAL, FREET4, T3FREE, THYROIDAB in the last 72 hours. Anemia Panel: No results for input(s): VITAMINB12, FOLATE, FERRITIN, TIBC, IRON, RETICCTPCT in the last 72 hours. Sepsis Labs: No results for input(s): PROCALCITON, LATICACIDVEN in the last 168 hours.  Recent Results (from the past 240 hour(s))  SARS Coronavirus 2 by RT PCR (hospital order, performed in [facility] hospital lab) Nasopharyngeal Nasopharyngeal Swab     Status: None   Collection Time: 08/22/19  8:53 PM   Specimen: Nasopharyngeal Swab  Result Value Ref Range Status   SARS Coronavirus 2 NEGATIVE NEGATIVE Final    Comment: (NOTE) SARS-CoV-2 target nucleic acids are NOT DETECTED. The SARS-CoV-2 RNA is generally detectable in upper and lower respiratory specimens during the acute phase of infection. The lowest concentration of SARS-CoV-2 viral copies this assay can detect is 250 copies / mL. A negative result does not preclude SARS-CoV-2 infection and should not be used as the sole basis for treatment or other patient management decisions.  A negative result may occur with improper specimen collection / handling, submission of specimen other than nasopharyngeal swab, presence of viral mutation(s) within the areas targeted by this assay, and inadequate number of viral copies (<250 copies  / mL). A negative result must be combined with clinical observations, patient history, and epidemiological information. Fact Sheet for Patients:   StrictlyIdeas.no Fact Sheet for Healthcare Providers: BankingDealers.co.za This test is not yet approved or cleared  by the [place] FDA and has been authorized for detection and/or diagnosis of SARS-CoV-2 by FDA under an Emergency Use Authorization (EUA).  This EUA will remain in effect (meaning this test can be used) for the duration of the COVID-19 declaration under Section 564(b)(1) of the Act, 21 U.S.C. section 360bbb-3(b)(1), unless the authorization is terminated or revoked sooner. Performed at [facility], [address], [place], [place] [address]   Culture, blood (routine x 2)     Status: None   Collection Time: 08/22/19  9:34 PM   Specimen: BLOOD RIGHT ARM  Result Value Ref Range Status   Specimen Description BLOOD RIGHT ARM  Final   Special Requests   Final    BOTTLES DRAWN AEROBIC AND ANAEROBIC Blood Culture adequate volume   Culture   Final    NO GROWTH 5 DAYS Performed at [facility], [address] [place], [place] [address]    Report Status 08/27/2019 FINAL  Final  Culture, blood (routine x 2)     Status: None   Collection Time: 08/22/19  9:35 PM   Specimen: BLOOD RIGHT ARM  Result Value Ref Range Status   Specimen Description BLOOD RIGHT ARM  Final   Special Requests   Final    BOTTLES DRAWN AEROBIC AND ANAEROBIC Blood Culture adequate volume   Culture   Final    NO GROWTH 5 DAYS Performed at [facility], [address] [place],  [address]    Report Status 08/27/2019 FINAL  Final         Radiology Studies: DG Abd 1 View  Result Date: 08/26/2019 CLINICAL DATA:  Small bowel obstruction. EXAM: ABDOMEN - 1 VIEW COMPARISON:  CT abdomen pelvis from yesterday. FINDINGS: Enteric tube in the stomach. Peritoneal dialysis catheter in the pelvis. Multiple  dilated loops of small bowel measuring up to 5.0 cm in diameter, progressed from yesterday's CT. No  radio-opaque calculi or other significant radiographic abnormality are seen. No acute osseous abnormality. Right iliac artery stents. IMPRESSION: 1. Mildly worsened small bowel obstruction. Electronically Signed   By: Titus Dubin M.D.   On: 08/26/2019 12:01   CT ABDOMEN PELVIS W CONTRAST  Result Date: 08/25/2019 CLINICAL DATA:  Nausea, vomiting. EXAM: CT ABDOMEN AND PELVIS WITH CONTRAST TECHNIQUE: Multidetector CT imaging of the abdomen and pelvis was performed using the standard protocol following bolus administration of intravenous contrast. CONTRAST:  176mL OMNIPAQUE IOHEXOL 300 MG/ML  SOLN COMPARISON:  November 27, 2018. FINDINGS: Lower chest: Small left pleural effusion is noted with adjacent subsegmental atelectasis. Hepatobiliary: No gallstones or biliary dilatation is noted. Multiple cysts are again noted in the patent parenchyma. Pancreas: Unremarkable. No pancreatic ductal dilatation or surrounding inflammatory changes. Spleen: Normal in size without focal abnormality. Adrenals/Urinary Tract: Adrenal glands appear normal. Continued presence of findings consistent with severe polycystic kidney disease. No hydronephrosis or renal obstruction is noted. Urinary bladder is unremarkable. 5.4 x 5.1 cm hyperdense abnormality is seen involving the anterior aspect of the superior pole of the left kidney which is enlarged compared to prior exam. This may represent enlarging hyperdense cyst, but possible neoplasm cannot be excluded. Further evaluation with MRI or ultrasound is recommended. Stomach/Bowel: Mild gastric distention is noted. No colonic obstruction is noted. The appendix is not visualized. Proximal small bowel dilatation is noted with possible transition zone seen in the left upper quadrant best seen on image number 41 of series 3. More distal small bowel is not dilated. This is concerning for possible  small bowel obstruction, potentially due to adhesion. Vascular/Lymphatic: Aortic atherosclerosis. No enlarged abdominal or pelvic lymph nodes. Reproductive: Prostate is unremarkable. Other: Distal tip of peritoneal dialysis catheter is noted anteriorly within the pelvis. Mild amount of free fluid is noted in the more posterior and dependent portion of the pelvis. Musculoskeletal: Extensive postsurgical and degenerative changes are seen involving the lumbar spine. No acute abnormality is noted. IMPRESSION: 1. Continued presence of findings consistent with severe polycystic kidney disease. 5.4 x 5.1 cm hyperdense abnormality is seen involving anterior aspect of superior pole of left kidney which is enlarged compared to prior exam. This may represent enlarging hyperdense cyst, but possible neoplasm cannot be excluded. Further evaluation with MRI or ultrasound is recommended. 2. Proximal small bowel dilatation is noted with possible transition zone seen in the left upper quadrant best seen on image number 41 of series 3. More distal small bowel is not dilated. This is concerning for possible small bowel obstruction, potentially due to adhesion. 3. Mild amount of free fluid is noted in the more posterior and dependent portion of the pelvis. 4. Small left pleural effusion is noted with adjacent subsegmental atelectasis. Aortic Atherosclerosis (ICD10-I70.0). Electronically Signed   By: Marijo Conception M.D.   On: 08/25/2019 20:28   DG Abd Portable 1V-Small Bowel Obstruction Protocol-initial, 8 hr delay  Result Date: 08/26/2019 CLINICAL DATA:  9 hour delay, small-bowel protocol EXAM: PORTABLE ABDOMEN - 1 VIEW COMPARISON:  Radiograph 08/26/2019, CT 08/25/2019 FINDINGS: Multiple stacked air distended loops of small bowel are present. High-density contrast appears largely retained within the distended gastric lumen. Transesophageal tube tip and side port remain within the gastric body. No evidence of free intraperitoneal  air. Basilar atelectatic changes. Additional support devices overlie the chest and abdomen. Foley catheter in place. Degenerative changes in the spine, hips and pelvis. IMPRESSION: Retention of contrast within the distended gastric lumen. Persistently dilated loops of small bowel  compatible with obstruction or ileus though the lack of transit is somewhat suggestive of adynamic bowel motility. Electronically Signed   By: Lovena Le M.D.   On: 08/26/2019 21:21        Scheduled Meds: . atorvastatin  80 mg Oral Daily  . bisacodyl  10 mg Rectal Once  . budesonide (PULMICORT) nebulizer solution  0.25 mg Nebulization BID  . calcium acetate  1,334 mg Oral TID WC  . carvedilol  25 mg Oral BID  . Chlorhexidine Gluconate Cloth  6 each Topical Daily  . diltiazem  60 mg Oral Q6H  . feeding supplement (NEPRO CARB STEADY)  237 mL Oral BID BM  . gabapentin  300 mg Oral QHS  . gentamicin cream  1 application Topical Daily  . guaiFENesin  600 mg Oral BID  . ipratropium-albuterol  3 mL Nebulization BID  . isosorbide mononitrate  60 mg Oral Daily  . pantoprazole (PROTONIX) IV  40 mg Intravenous Q12H   Continuous Infusions: . sodium chloride 75 mL/hr at 08/27/19 0925     LOS: 5 days    Time spent: 25 minutes    Barb Merino, MD [facility] Pager [phone]

## 2019-08-27 NOTE — Progress Notes (Signed)
Patient informed that there are new medications ordered to help encourage a BM with a suppository. Patient is refusing at this time and states he has no bowels to move as he has not been eating. Patient encouraged to increase movement in the room.

## 2019-08-27 NOTE — Progress Notes (Signed)
Subjective: Abdominal distention significantly improved with NG tube. 2 L of NG tube output. 2.5 L of ultrafiltration on dialysis. Overall shortness of breath significantly improved. Lung exam also improved. Likely pulmonary edema was contributing.   Objective Vital signs in last 24 hours: Vitals:   08/27/19 0017 08/27/19 0151 08/27/19 0542 08/27/19 0824  BP: (!) 152/104 129/84 (!) 142/109 123/88  Pulse: (!) 103 (!) 42 82 (!) 58  Resp: 18 18 18 20   Temp: 98.2 F (36.8 C) 97.8 F (36.6 C) 98.4 F (36.9 C)   TempSrc: Oral Oral Oral   SpO2: 99% 98% 98% 94%  Weight:   80.1 kg   Height:       Weight change:   Intake/Output Summary (Last 24 hours) at 08/27/2019 1048 Last data filed at 08/27/2019 0930 Gross per 24 hour  Intake 3036.72 ml  Output 3300 ml  Net -263.28 ml    Assessment/ Plan: Pt is a 64 y.o. yo male with ESRD on PD who was admitted on 08/22/2019 with SOB-  Felt to have COPD exacerbation/Afib with rapid rate and possibly some volume overload   Assessment/Plan: 1. SOB/COPD/pulmonary edema-  Felt to be multifactorial - COPD/  Afib with rapid rate and volume overload. PD changes to increase ultrafiltration. S/p Steroids/inhalers/azithro. Now significantly negative for 2 days and lung exam and shortness of breath have improved. We'll continue ultrafiltration as below 2. ESRD -secondary to polycystic kidney disease.  Has been on PD for 6 mos. followed in [place].  Albumin is low which can be problematic in PD and UF.  Using 4.25% PD fluid for max UF. Adjusted dwell times w/ now improved UF. We'll continue aggressive ultrafiltration for 1 more day and possibly back down if the patient remains n.p.o. Tolerating UF at this time. 3. Anemia- not an issue right now 4. Secondary hyperparathyroidism - calc OK-  Phos slightly elevated - no binder on med list. Now NPO but if starts to eat again could consider binder 5. HTN/volume-  continue carvedilol and diltiazem for atrial fibrillation.  Ultrafiltration as above. 6.  Leukocytosis: Likely associated with steroids.  Fluid studies from peritoneal fluid negative for infection. 7.  Partial small bowel obstruction: N.p.o. except for meds.  Holding Lasix and off of steroids.  NG tube being placed.  Likely related to adhesion and PD catheter hopefully not contributing. 8.  Hyperdense kidney cyst: Consider further evaluation with MRI in the future.  Reesa Chew    Labs: Basic Metabolic Panel: Recent Labs  Lab 08/25/19 0622 08/26/19 0631 08/27/19 0526  NA 140 142 143  K 3.6 3.6 3.9  CL 94* 96* 99  CO2 28 29 30   GLUCOSE 211* 155* 93  BUN 56* 66* 67*  CREATININE 6.31* 6.89* 6.80*  CALCIUM 8.2* 8.2* 7.6*  PHOS 7.2* 7.3* 7.2*   Liver Function Tests: Recent Labs  Lab 08/25/19 0622 08/26/19 0631 08/27/19 0526  AST  --   --  18  ALT  --   --  12  ALKPHOS  --   --  78  BILITOT  --   --  1.0  PROT  --   --  5.6*  ALBUMIN 2.2* 2.4* 2.1*   Recent Labs  Lab 08/25/19 1740  LIPASE 23   No results for input(s): AMMONIA in the last 168 hours. CBC: Recent Labs  Lab 08/22/19 1930 08/22/19 1930 08/23/19 0348 08/23/19 0348 08/24/19 0908 08/25/19 1740 08/27/19 0526  WBC 10.8*   < > 9.9   < >  13.5* 20.8* 16.3*  NEUTROABS  --   --   --   --   --  19.9* 14.1*  HGB 12.0*   < > 11.5*   < > 12.1* 12.7* 12.9*  HCT 37.1*   < > 36.3*   < > 37.6* 39.2 39.9  MCV 99.7  --  100.3*  --  99.2 99.2 101.8*  PLT 210   < > 201   < > 266 340 276   < > = values in this interval not displayed.   Cardiac Enzymes: No results for input(s): CKTOTAL, CKMB, CKMBINDEX, TROPONINI in the last 168 hours. CBG: No results for input(s): GLUCAP in the last 168 hours.  Iron Studies: No results for input(s): IRON, TIBC, TRANSFERRIN, FERRITIN in the last 72 hours. Studies/Results: DG Abd 1 View  Result Date: 08/26/2019 CLINICAL DATA:  Small bowel obstruction. EXAM: ABDOMEN - 1 VIEW COMPARISON:  CT abdomen pelvis from yesterday. FINDINGS:  Enteric tube in the stomach. Peritoneal dialysis catheter in the pelvis. Multiple dilated loops of small bowel measuring up to 5.0 cm in diameter, progressed from yesterday's CT. No radio-opaque calculi or other significant radiographic abnormality are seen. No acute osseous abnormality. Right iliac artery stents. IMPRESSION: 1. Mildly worsened small bowel obstruction. Electronically Signed   By: Titus Dubin M.D.   On: 08/26/2019 12:01   CT ABDOMEN PELVIS W CONTRAST  Result Date: 08/25/2019 CLINICAL DATA:  Nausea, vomiting. EXAM: CT ABDOMEN AND PELVIS WITH CONTRAST TECHNIQUE: Multidetector CT imaging of the abdomen and pelvis was performed using the standard protocol following bolus administration of intravenous contrast. CONTRAST:  135mL OMNIPAQUE IOHEXOL 300 MG/ML  SOLN COMPARISON:  November 27, 2018. FINDINGS: Lower chest: Small left pleural effusion is noted with adjacent subsegmental atelectasis. Hepatobiliary: No gallstones or biliary dilatation is noted. Multiple cysts are again noted in the patent parenchyma. Pancreas: Unremarkable. No pancreatic ductal dilatation or surrounding inflammatory changes. Spleen: Normal in size without focal abnormality. Adrenals/Urinary Tract: Adrenal glands appear normal. Continued presence of findings consistent with severe polycystic kidney disease. No hydronephrosis or renal obstruction is noted. Urinary bladder is unremarkable. 5.4 x 5.1 cm hyperdense abnormality is seen involving the anterior aspect of the superior pole of the left kidney which is enlarged compared to prior exam. This may represent enlarging hyperdense cyst, but possible neoplasm cannot be excluded. Further evaluation with MRI or ultrasound is recommended. Stomach/Bowel: Mild gastric distention is noted. No colonic obstruction is noted. The appendix is not visualized. Proximal small bowel dilatation is noted with possible transition zone seen in the left upper quadrant best seen on image number 41 of  series 3. More distal small bowel is not dilated. This is concerning for possible small bowel obstruction, potentially due to adhesion. Vascular/Lymphatic: Aortic atherosclerosis. No enlarged abdominal or pelvic lymph nodes. Reproductive: Prostate is unremarkable. Other: Distal tip of peritoneal dialysis catheter is noted anteriorly within the pelvis. Mild amount of free fluid is noted in the more posterior and dependent portion of the pelvis. Musculoskeletal: Extensive postsurgical and degenerative changes are seen involving the lumbar spine. No acute abnormality is noted. IMPRESSION: 1. Continued presence of findings consistent with severe polycystic kidney disease. 5.4 x 5.1 cm hyperdense abnormality is seen involving anterior aspect of superior pole of left kidney which is enlarged compared to prior exam. This may represent enlarging hyperdense cyst, but possible neoplasm cannot be excluded. Further evaluation with MRI or ultrasound is recommended. 2. Proximal small bowel dilatation is noted with possible transition zone seen  in the left upper quadrant best seen on image number 41 of series 3. More distal small bowel is not dilated. This is concerning for possible small bowel obstruction, potentially due to adhesion. 3. Mild amount of free fluid is noted in the more posterior and dependent portion of the pelvis. 4. Small left pleural effusion is noted with adjacent subsegmental atelectasis. Aortic Atherosclerosis (ICD10-I70.0). Electronically Signed   By: Marijo Conception M.D.   On: 08/25/2019 20:28   DG Abd Portable 1V-Small Bowel Obstruction Protocol-initial, 8 hr delay  Result Date: 08/26/2019 CLINICAL DATA:  9 hour delay, small-bowel protocol EXAM: PORTABLE ABDOMEN - 1 VIEW COMPARISON:  Radiograph 08/26/2019, CT 08/25/2019 FINDINGS: Multiple stacked air distended loops of small bowel are present. High-density contrast appears largely retained within the distended gastric lumen. Transesophageal tube tip  and side port remain within the gastric body. No evidence of free intraperitoneal air. Basilar atelectatic changes. Additional support devices overlie the chest and abdomen. Foley catheter in place. Degenerative changes in the spine, hips and pelvis. IMPRESSION: Retention of contrast within the distended gastric lumen. Persistently dilated loops of small bowel compatible with obstruction or ileus though the lack of transit is somewhat suggestive of adynamic bowel motility. Electronically Signed   By: Lovena Le M.D.   On: 08/26/2019 21:21   Medications: Infusions: . sodium chloride 75 mL/hr at 08/27/19 0925    Scheduled Medications: . atorvastatin  80 mg Oral Daily  . bisacodyl  10 mg Rectal Once  . budesonide (PULMICORT) nebulizer solution  0.25 mg Nebulization BID  . calcium acetate  1,334 mg Oral TID WC  . carvedilol  25 mg Oral BID  . Chlorhexidine Gluconate Cloth  6 each Topical Daily  . diltiazem  60 mg Oral Q6H  . feeding supplement (NEPRO CARB STEADY)  237 mL Oral BID BM  . gabapentin  300 mg Oral QHS  . gentamicin cream  1 application Topical Daily  . guaiFENesin  600 mg Oral BID  . ipratropium-albuterol  3 mL Nebulization BID  . isosorbide mononitrate  60 mg Oral Daily  . pantoprazole (PROTONIX) IV  40 mg Intravenous Q12H    have reviewed scheduled and prn medications.  Physical Exam: General: sitting up in bedside chair-  nad-  On O2 Heart: tachycardia Lungs: Bilateral breath sounds but no significant wheezing, no iwob Abdomen: Improved distention, relatively soft Extremities: Trace edema Dialysis Access: PD cath    08/27/2019,10:48 AM  LOS: 5 days

## 2019-08-28 LAB — COMPREHENSIVE METABOLIC PANEL
ALT: 9 U/L (ref 0–44)
AST: 13 U/L — ABNORMAL LOW (ref 15–41)
Albumin: 1.9 g/dL — ABNORMAL LOW (ref 3.5–5.0)
Alkaline Phosphatase: 78 U/L (ref 38–126)
Anion gap: 11 (ref 5–15)
BUN: 61 mg/dL — ABNORMAL HIGH (ref 8–23)
CO2: 28 mmol/L (ref 22–32)
Calcium: 7.4 mg/dL — ABNORMAL LOW (ref 8.9–10.3)
Chloride: 101 mmol/L (ref 98–111)
Creatinine, Ser: 6.39 mg/dL — ABNORMAL HIGH (ref 0.61–1.24)
GFR calc Af Amer: 10 mL/min — ABNORMAL LOW (ref >60)
GFR calc non Af Amer: 8 mL/min — ABNORMAL LOW (ref >60)
Glucose, Bld: 135 mg/dL — ABNORMAL HIGH (ref 70–99)
Potassium: 3.2 mmol/L — ABNORMAL LOW (ref 3.5–5.1)
Sodium: 140 mmol/L (ref 135–145)
Total Bilirubin: 0.6 mg/dL (ref 0.3–1.2)
Total Protein: 5.7 g/dL — ABNORMAL LOW (ref 6.5–8.1)

## 2019-08-28 LAB — CBC WITH DIFFERENTIAL/PLATELET
Abs Immature Granulocytes: 0.05 10*3/uL (ref 0.00–0.07)
Basophils Absolute: 0 10*3/uL (ref 0.0–0.1)
Basophils Relative: 0 %
Eosinophils Absolute: 0 10*3/uL (ref 0.0–0.5)
Eosinophils Relative: 0 %
HCT: 40 % (ref 39.0–52.0)
Hemoglobin: 12.8 g/dL — ABNORMAL LOW (ref 13.0–17.0)
Immature Granulocytes: 1 %
Lymphocytes Relative: 7 %
Lymphs Abs: 0.7 10*3/uL (ref 0.7–4.0)
MCH: 31.7 pg (ref 26.0–34.0)
MCHC: 32 g/dL (ref 30.0–36.0)
MCV: 99 fL (ref 80.0–100.0)
Monocytes Absolute: 0.6 10*3/uL (ref 0.1–1.0)
Monocytes Relative: 6 %
Neutro Abs: 9.3 10*3/uL — ABNORMAL HIGH (ref 1.7–7.7)
Neutrophils Relative %: 86 %
Platelets: 258 10*3/uL (ref 150–400)
RBC: 4.04 MIL/uL — ABNORMAL LOW (ref 4.22–5.81)
RDW: 13.9 % (ref 11.5–15.5)
WBC: 10.7 10*3/uL — ABNORMAL HIGH (ref 4.0–10.5)
nRBC: 0 % (ref 0.0–0.2)

## 2019-08-28 LAB — PHOSPHORUS: Phosphorus: 5.9 mg/dL — ABNORMAL HIGH (ref 2.5–4.6)

## 2019-08-28 LAB — MAGNESIUM: Magnesium: 1.5 mg/dL — ABNORMAL LOW (ref 1.7–2.4)

## 2019-08-28 MED ORDER — POTASSIUM CHLORIDE 10 MEQ/100ML IV SOLN
10.0000 meq | INTRAVENOUS | Status: AC
  Administered 2019-08-28 (×2): 10 meq via INTRAVENOUS
  Filled 2019-08-28 (×2): qty 100

## 2019-08-28 MED ORDER — MAGNESIUM SULFATE 2 GM/50ML IV SOLN
2.0000 g | Freq: Once | INTRAVENOUS | Status: AC
  Administered 2019-08-28: 2 g via INTRAVENOUS
  Filled 2019-08-28: qty 50

## 2019-08-28 NOTE — Progress Notes (Signed)
PROGRESS NOTE    DAWSON ALBERS  GYI:[id number] DOB: October 22, 1955 DOA: 08/22/2019 PCP: Gwenlyn Saran [place]    Brief Narrative:   64 year old gentleman with medical history significant for hypertension, hyperlipidemia, chronic atrial fibrillation on Eliquis, ESRD on peritoneal dialysis presented to the emergency department for evaluation of shortness of breath.  Patient does have history of COPD, he recently quit his smoking few months ago.  He does have some wheezing at baseline.  Came to the ER with worsening shortness of breath. In the emergency room, he was comfortable, found to have A. fib with RVR started on Cardizem drip.  Transfer to [facility] for progressive bed from [facility]. 4/13: Patient started having intermittent biliary vomiting.  CT scan shows small bowel obstruction.  Surgery consulted in the morning 4/14.   Assessment & Plan:   Principal Problem:   COPD with acute exacerbation ([place]) Active Problems:   Essential hypertension   ESRD on dialysis ([place])   Exertional dyspnea   Acute exacerbation of CHF (congestive heart failure) ([facility])   Atrial fibrillation with RVR ([facility])  COPD with acute exacerbation: Improved.  Continue breathing treatments and mobility.  Bronchodilator as needed.  Now off oxygen.  Small bowel obstruction secondary to adhesions: Some improvement today.  NG tube with significant output.  Followed by surgery.  Clamping trial and clear liquids today.   Holding Eliquis and Plavix, 5/13.  On maintenance IV fluid.  . Dialysate negative for active infection.  Chronic atrial fibrillation with RVR: Heart rate better acceptable.  We will continue Coreg and diltiazem.  Eliquis on hold.    ESRD on peritoneal dialysis: Followed by nephrology.  Medication adjustment as per nephrology.  DVT prophylaxis: SCDs.  Eliquis on hold. Code Status: Full code Family Communication: Patient's daughter called and updated 5/15. Disposition Plan:  Status is: Inpatient  Remains inpatient appropriate because:Inpatient level of care appropriate due to severity of illness   Dispo: The patient is from: Home              Anticipated d/c is to: Home              Anticipated d/c date is: 2 days              Patient currently is not medically stable to d/c.  Patient has small bowel obstruction, needing active inpatient treatment and management.  Consultants:   Nephrology  General surgery  Procedures:   Peritoneal dialysis  Antimicrobials:   Azithromycin, 5/11--- 5/13   Subjective: No overnight events.  Denies any abdominal pain.  He has not mobilized yet.  Passing flatus.  No bowel movement.  Feels hungry and wants to eat. Objective: Vitals:   08/28/19 0300 08/28/19 0407 08/28/19 0835 08/28/19 0900  BP:  131/87  112/85  Pulse:  67  66  Resp:  18  16  Temp:  97.8 F (36.6 C)  98 F (36.7 C)  TempSrc:  Oral  Oral  SpO2:  97% 96% 96%  Weight: 82.1 kg     Height:        Intake/Output Summary (Last 24 hours) at 08/28/2019 1124 Last data filed at 08/28/2019 0600 Gross per 24 hour  Intake 2826.5 ml  Output 2650 ml  Net 176.5 ml   Filed Weights   08/26/19 1355 08/27/19 0542 08/28/19 0300  Weight: 80.8 kg 80.1 kg 82.1 kg    Examination:  General exam: Appears calm and comfortable, chronically sick looking  .  Today on  room air. Respiratory system: Bilateral clear.  No added sounds. Cardiovascular system: S1 & S2 heard, irregularly irregular, No JVD, murmurs, rubs, gallops or clicks. 1+ bilateral pedal edema . Gastrointestinal system: Abdomen is soft.  Nontender.  Sluggish bowel sound present.  PD catheter intact and draining. Central nervous system: Alert and oriented. No focal neurological deficits. Extremities: Symmetric 5 x 5 power. Skin: No rashes, lesions or ulcers Psychiatry: Judgement and insight appear normal. Mood & affect appropriate.     Data Reviewed: I have personally reviewed following labs and  imaging studies  CBC: Recent Labs  Lab 08/23/19 0348 08/24/19 0908 08/25/19 1740 08/27/19 0526 08/28/19 0324  WBC 9.9 13.5* 20.8* 16.3* 10.7*  NEUTROABS  --   --  19.9* 14.1* 9.3*  HGB 11.5* 12.1* 12.7* 12.9* 12.8*  HCT 36.3* 37.6* 39.2 39.9 40.0  MCV 100.3* 99.2 99.2 101.8* 99.0  PLT 201 266 340 276 449   Basic Metabolic Panel: Recent Labs  Lab 08/22/19 1930 08/23/19 0348 08/24/19 0908 08/25/19 0622 08/26/19 0631 08/27/19 0526 08/28/19 0324  NA 136   < > 137 140 142 143 140  K 3.8   < > 3.4* 3.6 3.6 3.9 3.2*  CL 100   < > 99 94* 96* 99 101  CO2 21*   < > 25 28 29 30 28   GLUCOSE 98   < > 167* 211* 155* 93 135*  BUN 58*   < > 55* 56* 66* 67* 61*  CREATININE 6.79*   < > 6.66* 6.31* 6.89* 6.80* 6.39*  CALCIUM 7.5*   < > 8.0* 8.2* 8.2* 7.6* 7.4*  MG 1.7  --   --   --   --  1.7 1.5*  PHOS  --    < > 6.2* 7.2* 7.3* 7.2* 5.9*   < > = values in this interval not displayed.   GFR: Estimated Creatinine Clearance: 13.7 mL/min (A) (by C-G formula based on SCr of 6.39 mg/dL (H)). Liver Function Tests: Recent Labs  Lab 08/24/19 0908 08/25/19 0622 08/26/19 0631 08/27/19 0526 08/28/19 0324  AST  --   --   --  18 13*  ALT  --   --   --  12 9  ALKPHOS  --   --   --  78 78  BILITOT  --   --   --  1.0 0.6  PROT  --   --   --  5.6* 5.7*  ALBUMIN 2.2* 2.2* 2.4* 2.1* 1.9*   Recent Labs  Lab 08/25/19 1740  LIPASE 23   No results for input(s): AMMONIA in the last 168 hours. Coagulation Profile: No results for input(s): INR, PROTIME in the last 168 hours. Cardiac Enzymes: No results for input(s): CKTOTAL, CKMB, CKMBINDEX, TROPONINI in the last 168 hours. BNP (last 3 results) No results for input(s): PROBNP in the last 8760 hours. HbA1C: No results for input(s): HGBA1C in the last 72 hours. CBG: No results for input(s): GLUCAP in the last 168 hours. Lipid Profile: No results for input(s): CHOL, HDL, LDLCALC, TRIG, CHOLHDL, LDLDIRECT in the last 72 hours. Thyroid  Function Tests: No results for input(s): TSH, T4TOTAL, FREET4, T3FREE, THYROIDAB in the last 72 hours. Anemia Panel: No results for input(s): VITAMINB12, FOLATE, FERRITIN, TIBC, IRON, RETICCTPCT in the last 72 hours. Sepsis Labs: No results for input(s): PROCALCITON, LATICACIDVEN in the last 168 hours.  Recent Results (from the past 240 hour(s))  SARS Coronavirus 2 by RT PCR (hospital order, performed in [facility]  hospital lab) Nasopharyngeal Nasopharyngeal Swab     Status: None   Collection Time: 08/22/19  8:53 PM   Specimen: Nasopharyngeal Swab  Result Value Ref Range Status   SARS Coronavirus 2 NEGATIVE NEGATIVE Final    Comment: (NOTE) SARS-CoV-2 target nucleic acids are NOT DETECTED. The SARS-CoV-2 RNA is generally detectable in upper and lower respiratory specimens during the acute phase of infection. The lowest concentration of SARS-CoV-2 viral copies this assay can detect is 250 copies / mL. A negative result does not preclude SARS-CoV-2 infection and should not be used as the sole basis for treatment or other patient management decisions.  A negative result may occur with improper specimen collection / handling, submission of specimen other than nasopharyngeal swab, presence of viral mutation(s) within the areas targeted by this assay, and inadequate number of viral copies (<250 copies / mL). A negative result must be combined with clinical observations, patient history, and epidemiological information. Fact Sheet for Patients:   StrictlyIdeas.no Fact Sheet for Healthcare Providers: BankingDealers.co.za This test is not yet approved or cleared  by the [place] FDA and has been authorized for detection and/or diagnosis of SARS-CoV-2 by FDA under an Emergency Use Authorization (EUA).  This EUA will remain in effect (meaning this test can be used) for the duration of the COVID-19 declaration under Section 564(b)(1) of the  Act, 21 U.S.C. section 360bbb-3(b)(1), unless the authorization is terminated or revoked sooner. Performed at [facility], [address] [place], [place] [address]   Culture, blood (routine x 2)     Status: None   Collection Time: 08/22/19  9:34 PM   Specimen: BLOOD RIGHT ARM  Result Value Ref Range Status   Specimen Description BLOOD RIGHT ARM  Final   Special Requests   Final    BOTTLES DRAWN AEROBIC AND ANAEROBIC Blood Culture adequate volume   Culture   Final    NO GROWTH 5 DAYS Performed at [facility], [name], [place], [place] [address]    Report Status 08/27/2019 FINAL  Final  Culture, blood (routine x 2)     Status: None   Collection Time: 08/22/19  9:35 PM   Specimen: BLOOD RIGHT ARM  Result Value Ref Range Status   Specimen Description BLOOD RIGHT ARM  Final   Special Requests   Final    BOTTLES DRAWN AEROBIC AND ANAEROBIC Blood Culture adequate volume   Culture   Final    NO GROWTH 5 DAYS Performed at [facility], [address], [place], [place] [address]    Report Status 08/27/2019 FINAL  Final         Radiology Studies: DG Abd Portable 1V  Result Date: 08/27/2019 CLINICAL DATA:  Follow-up for small bowel obstruction. EXAM: PORTABLE ABDOMEN - 1 VIEW COMPARISON:  08/26/2019 and CT from 08/25/2019. FINDINGS: There are dilated small bowel loops predominantly in the left abdomen trauma similar to the previous day's study. Nasogastric tube tip projects in the distal stomach. IMPRESSION: 1. Persistent small-bowel dilation consistent with a partial small bowel obstruction, without significant change from the previous day's study. Electronically Signed   By: Lajean Manes M.D.   On: 08/27/2019 13:59   DG Abd Portable 1V-Small Bowel Obstruction Protocol-initial, 8 hr delay  Result Date: 08/26/2019 CLINICAL DATA:  9 hour delay, small-bowel protocol EXAM: PORTABLE ABDOMEN - 1 VIEW COMPARISON:  Radiograph 08/26/2019, CT 08/25/2019 FINDINGS: Multiple stacked air  distended loops of small bowel are present. High-density contrast appears largely retained within the distended  gastric lumen. Transesophageal tube tip and side port remain within the gastric body. No evidence of free intraperitoneal air. Basilar atelectatic changes. Additional support devices overlie the chest and abdomen. Foley catheter in place. Degenerative changes in the spine, hips and pelvis. IMPRESSION: Retention of contrast within the distended gastric lumen. Persistently dilated loops of small bowel compatible with obstruction or ileus though the lack of transit is somewhat suggestive of adynamic bowel motility. Electronically Signed   By: Lovena Le M.D.   On: 08/26/2019 21:21        Scheduled Meds: . atorvastatin  80 mg Oral Daily  . bisacodyl  10 mg Rectal Once  . budesonide (PULMICORT) nebulizer solution  0.25 mg Nebulization BID  . calcium acetate  1,334 mg Oral TID WC  . carvedilol  25 mg Oral BID  . Chlorhexidine Gluconate Cloth  6 each Topical Daily  . diltiazem  60 mg Oral Q6H  . feeding supplement (NEPRO CARB STEADY)  237 mL Oral BID BM  . gabapentin  300 mg Oral QHS  . gentamicin cream  1 application Topical Daily  . guaiFENesin  600 mg Oral BID  . ipratropium-albuterol  3 mL Nebulization BID  . isosorbide mononitrate  60 mg Oral Daily  . pantoprazole (PROTONIX) IV  40 mg Intravenous Q12H   Continuous Infusions: . sodium chloride 75 mL/hr at 08/28/19 0044  . magnesium sulfate bolus IVPB       LOS: 6 days    Time spent: 25 minutes    Barb Merino, MD [facility] Pager [phone]

## 2019-08-28 NOTE — Progress Notes (Addendum)
Subjective: Feels a little bit better today.  Is passing flatus but wants eat food.  Clamping NG tube today.  Ultrafiltration not yet recorded.  Objective Vital signs in last 24 hours: Vitals:   08/28/19 0300 08/28/19 0407 08/28/19 0835 08/28/19 0900  BP:  131/87  112/85  Pulse:  67  66  Resp:  18  16  Temp:  97.8 F (36.6 C)  98 F (36.7 C)  TempSrc:  Oral  Oral  SpO2:  97% 96% 96%  Weight: 82.1 kg     Height:       Weight change: 1.256 kg  Intake/Output Summary (Last 24 hours) at 08/28/2019 1035 Last data filed at 08/28/2019 0600 Gross per 24 hour  Intake 2826.5 ml  Output 2650 ml  Net 176.5 ml    Assessment/ Plan: Pt is a 64 y.o. yo male with ESRD on PD who was admitted on 08/22/2019 with SOB-  Felt to have COPD exacerbation/Afib with rapid rate and possibly some volume overload   Assessment/Plan: 1. SOB/COPD/pulmonary edema-  Felt to be multifactorial - COPD/  Afib with rapid rate and volume overload. PD changes to increase ultrafiltration. S/p Steroids/inhalers/azithro.  Lungs have improved over the past 48 hours.  Continue current prescription 2. ESRD -secondary to polycystic kidney disease.  Has been on PD for 6 mos. followed in [place].  Albumin is low which can be problematic in PD and UF.  Using 4.25% PD fluid for max UF. Adjusted dwell times w/ now improved UF. We'll continue aggressive ultrafiltration for now and continue to monitor his ultrafiltration and oxygen status 3. Anemia- not an issue right now 4. Secondary hyperparathyroidism - calc OK-  Phos slightly elevated improved today.  We will continue to monitor 5. HTN/volume-  continue carvedilol and diltiazem for atrial fibrillation. Ultrafiltration as above. 6.  Leukocytosis: Likely associated with steroids.  Fluid studies from peritoneal fluid negative for infection. 7.  Partial small bowel obstruction: N.p.o. except for meds.  Holding Lasix and off of steroids.  NG tube being clamped today.  Hopefully to  continue to see improvement.  Surgery following and appreciate assistance 8.  Hyperdense kidney cyst: Consider further evaluation with MRI in the future. 9.  Hypokalemia: Level 3.2.  Providing 20 mEq of IV potassium today 10.  Hypomagnesemia: Level 1.5.  Consider IV repletion tomorrow  Aaron [name]    Labs: Basic Metabolic Panel: Recent Labs  Lab 08/26/19 0631 08/27/19 0526 08/28/19 0324  NA 142 143 140  K 3.6 3.9 3.2*  CL 96* 99 101  CO2 29 30 28   GLUCOSE 155* 93 135*  BUN 66* 67* 61*  CREATININE 6.89* 6.80* 6.39*  CALCIUM 8.2* 7.6* 7.4*  PHOS 7.3* 7.2* 5.9*   Liver Function Tests: Recent Labs  Lab 08/26/19 0631 08/27/19 0526 08/28/19 0324  AST  --  18 13*  ALT  --  12 9  ALKPHOS  --  78 78  BILITOT  --  1.0 0.6  PROT  --  5.6* 5.7*  ALBUMIN 2.4* 2.1* 1.9*   Recent Labs  Lab 08/25/19 1740  LIPASE 23   No results for input(s): AMMONIA in the last 168 hours. CBC: Recent Labs  Lab 08/23/19 0348 08/23/19 0348 08/24/19 0908 08/24/19 0908 08/25/19 1740 08/27/19 0526 08/28/19 0324  WBC 9.9   < > 13.5*   < > 20.8* 16.3* 10.7*  NEUTROABS  --   --   --   --  19.9* 14.1* 9.3*  HGB 11.5*   < >  12.1*   < > 12.7* 12.9* 12.8*  HCT 36.3*   < > 37.6*   < > 39.2 39.9 40.0  MCV 100.3*  --  99.2  --  99.2 101.8* 99.0  PLT 201   < > 266   < > 340 276 258   < > = values in this interval not displayed.   Cardiac Enzymes: No results for input(s): CKTOTAL, CKMB, CKMBINDEX, TROPONINI in the last 168 hours. CBG: No results for input(s): GLUCAP in the last 168 hours.  Iron Studies: No results for input(s): IRON, TIBC, TRANSFERRIN, FERRITIN in the last 72 hours. Studies/Results: DG Abd Portable 1V  Result Date: 08/27/2019 CLINICAL DATA:  Follow-up for small bowel obstruction. EXAM: PORTABLE ABDOMEN - 1 VIEW COMPARISON:  08/26/2019 and CT from 08/25/2019. FINDINGS: There are dilated small bowel loops predominantly in the left abdomen trauma similar to the previous day's  study. Nasogastric tube tip projects in the distal stomach. IMPRESSION: 1. Persistent small-bowel dilation consistent with a partial small bowel obstruction, without significant change from the previous day's study. Electronically Signed   By: Lajean Manes M.D.   On: 08/27/2019 13:59   DG Abd Portable 1V-Small Bowel Obstruction Protocol-initial, 8 hr delay  Result Date: 08/26/2019 CLINICAL DATA:  9 hour delay, small-bowel protocol EXAM: PORTABLE ABDOMEN - 1 VIEW COMPARISON:  Radiograph 08/26/2019, CT 08/25/2019 FINDINGS: Multiple stacked air distended loops of small bowel are present. High-density contrast appears largely retained within the distended gastric lumen. Transesophageal tube tip and side port remain within the gastric body. No evidence of free intraperitoneal air. Basilar atelectatic changes. Additional support devices overlie the chest and abdomen. Foley catheter in place. Degenerative changes in the spine, hips and pelvis. IMPRESSION: Retention of contrast within the distended gastric lumen. Persistently dilated loops of small bowel compatible with obstruction or ileus though the lack of transit is somewhat suggestive of adynamic bowel motility. Electronically Signed   By: Lovena Le M.D.   On: 08/26/2019 21:21   Medications: Infusions: . sodium chloride 75 mL/hr at 08/28/19 0044  . potassium chloride 10 mEq (08/28/19 0939)    Scheduled Medications: . atorvastatin  80 mg Oral Daily  . bisacodyl  10 mg Rectal Once  . budesonide (PULMICORT) nebulizer solution  0.25 mg Nebulization BID  . calcium acetate  1,334 mg Oral TID WC  . carvedilol  25 mg Oral BID  . Chlorhexidine Gluconate Cloth  6 each Topical Daily  . diltiazem  60 mg Oral Q6H  . feeding supplement (NEPRO CARB STEADY)  237 mL Oral BID BM  . gabapentin  300 mg Oral QHS  . gentamicin cream  1 application Topical Daily  . guaiFENesin  600 mg Oral BID  . ipratropium-albuterol  3 mL Nebulization BID  . isosorbide  mononitrate  60 mg Oral Daily  . pantoprazole (PROTONIX) IV  40 mg Intravenous Q12H    have reviewed scheduled and prn medications.  Physical Exam: General: sitting up in bedside chair-  nad-  On O2 Heart: tachycardia Lungs: Coarse bilateral breath sounds, no increased work of breathing Abdomen: Improved distention, relatively soft Extremities: Trace edema Dialysis Access: PD cath    08/28/2019,10:35 AM  LOS: 6 days

## 2019-08-28 NOTE — Progress Notes (Signed)
[facility] Progress Note     Subjective: Patient denies abdominal pain and feels less bloated. +flatus. Denies nausea with NGT clamped. Wants some jello.   Objective: Vital signs in last 24 hours: Temp:  [97.6 F (36.4 C)-98 F (36.7 C)] 98 F (36.7 C) (05/16 0900) Pulse Rate:  [66-91] 66 (05/16 0900) Resp:  [16-20] 16 (05/16 0900) BP: (92-138)/(77-96) 112/85 (05/16 0900) SpO2:  [94 %-98 %] 96 % (05/16 0900) Weight:  [82.1 kg] 82.1 kg (05/16 0300) Last BM Date: 08/23/19  Intake/Output from previous day: 05/15 0701 - 05/16 0700 In: 3066.5 [P.O.:840; I.V.:2226.5] Out: 3450 [Urine:600; Emesis/NG output:2850] Intake/Output this shift: No intake/output data recorded.  PE: General: chronically ill appearing white male who is laying in bed in NAD Heart: regular, rate, and rhythm.  Normal s1,s2. No obvious murmurs, gallops, or rubs noted.  Palpable radial and pedal pulses bilaterally Lungs: CTAB, no wheezes, rhonchi, or rales noted.  Respiratory effort nonlabored Abd: soft, NT, moderately distended, BS hypoactive, PD catheter palpable   Lab Results:  Recent Labs    08/27/19 0526 08/28/19 0324  WBC 16.3* 10.7*  HGB 12.9* 12.8*  HCT 39.9 40.0  PLT 276 258   BMET Recent Labs    08/27/19 0526 08/28/19 0324  NA 143 140  K 3.9 3.2*  CL 99 101  CO2 30 28  GLUCOSE 93 135*  BUN 67* 61*  CREATININE 6.80* 6.39*  CALCIUM 7.6* 7.4*   PT/INR No results for input(s): LABPROT, INR in the last 72 hours. CMP     Component Value Date/Time   NA 140 08/28/2019 0324   K 3.2 (L) 08/28/2019 0324   CL 101 08/28/2019 0324   CO2 28 08/28/2019 0324   GLUCOSE 135 (H) 08/28/2019 0324   BUN 61 (H) 08/28/2019 0324   CREATININE 6.39 (H) 08/28/2019 0324   CALCIUM 7.4 (L) 08/28/2019 0324   PROT 5.7 (L) 08/28/2019 0324   ALBUMIN 1.9 (L) 08/28/2019 0324   AST 13 (L) 08/28/2019 0324   ALT 9 08/28/2019 0324   ALKPHOS 78 08/28/2019 0324   BILITOT 0.6 08/28/2019 0324    GFRNONAA 8 (L) 08/28/2019 0324   GFRAA 10 (L) 08/28/2019 0324   Lipase     Component Value Date/Time   LIPASE 23 08/25/2019 1740       Studies/Results: DG Abd Portable 1V  Result Date: 08/27/2019 CLINICAL DATA:  Follow-up for small bowel obstruction. EXAM: PORTABLE ABDOMEN - 1 VIEW COMPARISON:  08/26/2019 and CT from 08/25/2019. FINDINGS: There are dilated small bowel loops predominantly in the left abdomen trauma similar to the previous day's study. Nasogastric tube tip projects in the distal stomach. IMPRESSION: 1. Persistent small-bowel dilation consistent with a partial small bowel obstruction, without significant change from the previous day's study. Electronically Signed   By: Lajean Manes M.D.   On: 08/27/2019 13:59   DG Abd Portable 1V-Small Bowel Obstruction Protocol-initial, 8 hr delay  Result Date: 08/26/2019 CLINICAL DATA:  9 hour delay, small-bowel protocol EXAM: PORTABLE ABDOMEN - 1 VIEW COMPARISON:  Radiograph 08/26/2019, CT 08/25/2019 FINDINGS: Multiple stacked air distended loops of small bowel are present. High-density contrast appears largely retained within the distended gastric lumen. Transesophageal tube tip and side port remain within the gastric body. No evidence of free intraperitoneal air. Basilar atelectatic changes. Additional support devices overlie the chest and abdomen. Foley catheter in place. Degenerative changes in the spine, hips and pelvis. IMPRESSION: Retention of contrast within the distended gastric lumen. Persistently dilated loops  of small bowel compatible with obstruction or ileus though the lack of transit is somewhat suggestive of adynamic bowel motility. Electronically Signed   By: Lovena Le M.D.   On: 08/26/2019 21:21    Anti-infectives: Anti-infectives (From admission, onward)   Start     Dose/Rate Route Frequency Ordered Stop   08/23/19 2200  azithromycin (ZITHROMAX) tablet 500 mg  Status:  Discontinued    Note to Pharmacy: Pharmacy to  adjust timing for next dose.   500 mg Oral Daily at bedtime 08/23/19 0233 08/25/19 2046   08/22/19 2130  azithromycin (ZITHROMAX) 500 mg in sodium chloride 0.9 % 250 mL IVPB     500 mg 250 mL/hr over 60 Minutes Intravenous  Once 08/22/19 2122 08/22/19 2245       Assessment/Plan CHF COPD A fib Polycystic kidney disease, ESRD, on PD H/O MI CAD Hep C HLD  SBO - NGT clamped and patient +flatus - keep clamped and trial CLD - mobilize  - if continuing to have bowel function and tolerating CLD, will likely remove NGT tomorrow  - He would be a high risk surgical candidate if he were to fail to improve. He will hopefully get better with conservative management. We will following along.  FEN - CLD and clamp NGT VTE -plavix on hold as of 5/13 ID -none currently  LOS: 6 days    Norm Parcel , [facility] 08/28/2019, 10:16 AM Please see Amion for pager number during day hours 7:00am-4:30pm

## 2019-08-29 ENCOUNTER — Encounter (HOSPITAL_COMMUNITY)
Admission: EM | Disposition: A | Discharge status: Home Health | Payer: Self-pay | Source: Home / Self Care | Attending: Internal Medicine

## 2019-08-29 ENCOUNTER — Inpatient Hospital Stay (HOSPITAL_COMMUNITY): Payer: 59

## 2019-08-29 ENCOUNTER — Inpatient Hospital Stay: Payer: Self-pay

## 2019-08-29 ENCOUNTER — Encounter (HOSPITAL_COMMUNITY): Payer: Self-pay | Admitting: Internal Medicine

## 2019-08-29 ENCOUNTER — Inpatient Hospital Stay (HOSPITAL_COMMUNITY): Payer: 59 | Admitting: Anesthesiology

## 2019-08-29 ENCOUNTER — Other Ambulatory Visit: Payer: Self-pay

## 2019-08-29 HISTORY — PX: LYSIS OF ADHESION: SHX5961

## 2019-08-29 HISTORY — PX: LAPAROTOMY: SHX154

## 2019-08-29 HISTORY — PX: CENTRAL VENOUS CATHETER INSERTION: SHX401

## 2019-08-29 LAB — COMPREHENSIVE METABOLIC PANEL
ALT: 9 U/L (ref 0–44)
AST: 15 U/L (ref 15–41)
Albumin: 2.1 g/dL — ABNORMAL LOW (ref 3.5–5.0)
Alkaline Phosphatase: 81 U/L (ref 38–126)
Anion gap: 13 (ref 5–15)
BUN: 49 mg/dL — ABNORMAL HIGH (ref 8–23)
CO2: 30 mmol/L (ref 22–32)
Calcium: 7.8 mg/dL — ABNORMAL LOW (ref 8.9–10.3)
Chloride: 96 mmol/L — ABNORMAL LOW (ref 98–111)
Creatinine, Ser: 6.12 mg/dL — ABNORMAL HIGH (ref 0.61–1.24)
GFR calc Af Amer: 10 mL/min — ABNORMAL LOW (ref >60)
GFR calc non Af Amer: 9 mL/min — ABNORMAL LOW (ref >60)
Glucose, Bld: 163 mg/dL — ABNORMAL HIGH (ref 70–99)
Potassium: 3.2 mmol/L — ABNORMAL LOW (ref 3.5–5.1)
Sodium: 139 mmol/L (ref 135–145)
Total Bilirubin: 0.6 mg/dL (ref 0.3–1.2)
Total Protein: 6.1 g/dL — ABNORMAL LOW (ref 6.5–8.1)

## 2019-08-29 LAB — PHOSPHORUS: Phosphorus: 5.8 mg/dL — ABNORMAL HIGH (ref 2.5–4.6)

## 2019-08-29 LAB — MAGNESIUM: Magnesium: 1.9 mg/dL (ref 1.7–2.4)

## 2019-08-29 LAB — GLUCOSE, CAPILLARY: Glucose-Capillary: 110 mg/dL — ABNORMAL HIGH (ref 70–99)

## 2019-08-29 SURGERY — LAPAROTOMY, EXPLORATORY
Anesthesia: General | Site: Neck | Laterality: Right

## 2019-08-29 MED ORDER — POTASSIUM CHLORIDE CRYS ER 20 MEQ PO TBCR: 40.0000 meq | EXTENDED_RELEASE_TABLET | Freq: Two times a day (BID) | ORAL | Status: DC

## 2019-08-29 MED ORDER — ONDANSETRON HCL 4 MG/2ML IJ SOLN
INTRAMUSCULAR | Status: DC | PRN
  Administered 2019-08-29: 4 mg via INTRAVENOUS

## 2019-08-29 MED ORDER — GLYCOPYRROLATE PF 0.2 MG/ML IJ SOSY
PREFILLED_SYRINGE | INTRAMUSCULAR | Status: AC
  Filled 2019-08-29: qty 2

## 2019-08-29 MED ORDER — METOPROLOL TARTRATE 5 MG/5ML IV SOLN
5.0000 mg | Freq: Four times a day (QID) | INTRAVENOUS | Status: DC
  Administered 2019-08-29 – 2019-09-07 (×33): 5 mg via INTRAVENOUS
  Filled 2019-08-29 (×35): qty 5

## 2019-08-29 MED ORDER — DEXMEDETOMIDINE HCL IN NACL 200 MCG/50ML IV SOLN
INTRAVENOUS | Status: AC
  Filled 2019-08-29: qty 50

## 2019-08-29 MED ORDER — POTASSIUM CHLORIDE 10 MEQ/100ML IV SOLN
10.0000 meq | INTRAVENOUS | Status: AC
  Administered 2019-08-29 (×2): 10 meq via INTRAVENOUS
  Filled 2019-08-29 (×4): qty 100

## 2019-08-29 MED ORDER — GLYCOPYRROLATE 0.2 MG/ML IJ SOLN
INTRAMUSCULAR | Status: DC | PRN
  Administered 2019-08-29: .6 mg via INTRAVENOUS

## 2019-08-29 MED ORDER — 0.9 % SODIUM CHLORIDE (POUR BTL) OPTIME
TOPICAL | Status: DC | PRN
  Administered 2019-08-29 (×2): 1000 mL

## 2019-08-29 MED ORDER — SODIUM CHLORIDE 0.9% FLUSH
10.0000 mL | INTRAVENOUS | Status: DC | PRN
  Administered 2019-09-05 – 2019-09-06 (×2): 10 mL

## 2019-08-29 MED ORDER — SODIUM CHLORIDE 0.9% FLUSH
10.0000 mL | Freq: Two times a day (BID) | INTRAVENOUS | Status: DC
  Administered 2019-08-30 – 2019-09-02 (×5): 10 mL
  Administered 2019-09-03: 20 mL
  Administered 2019-09-03 – 2019-09-11 (×15): 10 mL

## 2019-08-29 MED ORDER — ROCURONIUM BROMIDE 10 MG/ML (PF) SYRINGE
PREFILLED_SYRINGE | INTRAVENOUS | Status: DC | PRN
Start: 2019-08-29 — End: 2019-08-29
  Administered 2019-08-29: 30 mg via INTRAVENOUS
  Administered 2019-08-29: 10 mg via INTRAVENOUS

## 2019-08-29 MED ORDER — GLYCOPYRROLATE PF 0.2 MG/ML IJ SOSY
PREFILLED_SYRINGE | INTRAMUSCULAR | Status: AC
  Filled 2019-08-29: qty 1

## 2019-08-29 MED ORDER — TRANEXAMIC ACID 1000 MG/10ML IV SOLN
2000.0000 mg | Freq: Once | INTRAVENOUS | Status: DC
  Filled 2019-08-29: qty 20

## 2019-08-29 MED ORDER — SODIUM CHLORIDE 0.9 % IV SOLN: INTRAVENOUS | Status: DC | PRN

## 2019-08-29 MED ORDER — PHENYLEPHRINE 40 MCG/ML (10ML) SYRINGE FOR IV PUSH (FOR BLOOD PRESSURE SUPPORT)
PREFILLED_SYRINGE | INTRAVENOUS | Status: DC | PRN
  Administered 2019-08-29: 80 ug via INTRAVENOUS
  Administered 2019-08-29: 120 ug via INTRAVENOUS
  Administered 2019-08-29: 80 ug via INTRAVENOUS

## 2019-08-29 MED ORDER — FENTANYL CITRATE (PF) 100 MCG/2ML IJ SOLN
INTRAMUSCULAR | Status: AC
  Filled 2019-08-29: qty 2

## 2019-08-29 MED ORDER — SUGAMMADEX SODIUM 200 MG/2ML IV SOLN
INTRAVENOUS | Status: DC | PRN
  Administered 2019-08-29: 100 mg via INTRAVENOUS

## 2019-08-29 MED ORDER — ALBUMIN HUMAN 5 % IV SOLN
INTRAVENOUS | Status: DC | PRN
Start: 2019-08-29 — End: 2019-08-29

## 2019-08-29 MED ORDER — FENTANYL CITRATE (PF) 250 MCG/5ML IJ SOLN
INTRAMUSCULAR | Status: AC
  Filled 2019-08-29: qty 5

## 2019-08-29 MED ORDER — CALCIUM CHLORIDE 10 % IV SOLN
INTRAVENOUS | Status: DC | PRN
  Administered 2019-08-29: 300 mg via INTRAVENOUS

## 2019-08-29 MED ORDER — TRACE MINERALS CU-MN-SE-ZN 300-55-60-3000 MCG/ML IV SOLN
INTRAVENOUS | Status: AC
  Filled 2019-08-29: qty 360

## 2019-08-29 MED ORDER — PHENYLEPHRINE HCL-NACL 10-0.9 MG/250ML-% IV SOLN
INTRAVENOUS | Status: DC | PRN
  Administered 2019-08-29: 50 ug/min via INTRAVENOUS

## 2019-08-29 MED ORDER — PROPOFOL 10 MG/ML IV BOLUS
INTRAVENOUS | Status: DC | PRN
  Administered 2019-08-29: 110 mg via INTRAVENOUS

## 2019-08-29 MED ORDER — NEOSTIGMINE METHYLSULFATE 3 MG/3ML IV SOSY
PREFILLED_SYRINGE | INTRAVENOUS | Status: AC
  Filled 2019-08-29: qty 6

## 2019-08-29 MED ORDER — CEFAZOLIN SODIUM-DEXTROSE 2-4 GM/100ML-% IV SOLN
2.0000 g | INTRAVENOUS | Status: AC
  Administered 2019-08-29: 2 g via INTRAVENOUS

## 2019-08-29 MED ORDER — PROPOFOL 10 MG/ML IV BOLUS
INTRAVENOUS | Status: AC
  Filled 2019-08-29: qty 20

## 2019-08-29 MED ORDER — ACETAMINOPHEN 10 MG/ML IV SOLN
1000.0000 mg | Freq: Four times a day (QID) | INTRAVENOUS | Status: AC
  Administered 2019-08-30 (×3): 1000 mg via INTRAVENOUS
  Filled 2019-08-29 (×4): qty 100

## 2019-08-29 MED ORDER — DEXAMETHASONE SODIUM PHOSPHATE 4 MG/ML IJ SOLN
INTRAMUSCULAR | Status: DC | PRN
  Administered 2019-08-29: 4 mg via INTRAVENOUS

## 2019-08-29 MED ORDER — FENTANYL CITRATE (PF) 100 MCG/2ML IJ SOLN
INTRAMUSCULAR | Status: DC | PRN
  Administered 2019-08-29: 25 ug via INTRAVENOUS
  Administered 2019-08-29: 50 ug via INTRAVENOUS
  Administered 2019-08-29: 75 ug via INTRAVENOUS

## 2019-08-29 MED ORDER — SUCCINYLCHOLINE CHLORIDE 20 MG/ML IJ SOLN
INTRAMUSCULAR | Status: DC | PRN
  Administered 2019-08-29: 100 mg via INTRAVENOUS

## 2019-08-29 MED ORDER — HEPARIN SOD (PORK) LOCK FLUSH 100 UNIT/ML IV SOLN
INTRAVENOUS | Status: AC
  Filled 2019-08-29: qty 5

## 2019-08-29 MED ORDER — FENTANYL CITRATE (PF) 100 MCG/2ML IJ SOLN
25.0000 ug | INTRAMUSCULAR | Status: DC | PRN
  Administered 2019-08-29 (×2): 50 ug via INTRAVENOUS

## 2019-08-29 MED ORDER — MIDAZOLAM HCL 2 MG/2ML IJ SOLN
INTRAMUSCULAR | Status: AC
  Filled 2019-08-29: qty 2

## 2019-08-29 MED ORDER — VASOPRESSIN 20 UNIT/ML IV SOLN
INTRAVENOUS | Status: DC | PRN
  Administered 2019-08-29: 2 [IU] via INTRAVENOUS
  Administered 2019-08-29: 1 [IU] via INTRAVENOUS

## 2019-08-29 MED ORDER — LIDOCAINE 2% (20 MG/ML) 5 ML SYRINGE
INTRAMUSCULAR | Status: DC | PRN
  Administered 2019-08-29: 60 mg via INTRAVENOUS

## 2019-08-29 MED ORDER — INSULIN ASPART 100 UNIT/ML ~~LOC~~ SOLN: 0.0000 [IU] | SUBCUTANEOUS | Status: DC

## 2019-08-29 MED ORDER — NEOSTIGMINE METHYLSULFATE 10 MG/10ML IV SOLN
INTRAVENOUS | Status: DC | PRN
Start: 2019-08-29 — End: 2019-08-29
  Administered 2019-08-29: 4 mg via INTRAVENOUS

## 2019-08-29 MED ORDER — MIDAZOLAM HCL 5 MG/5ML IJ SOLN
INTRAMUSCULAR | Status: DC | PRN
  Administered 2019-08-29 (×2): 1 mg via INTRAVENOUS

## 2019-08-29 MED ORDER — EPHEDRINE SULFATE-NACL 50-0.9 MG/10ML-% IV SOSY
PREFILLED_SYRINGE | INTRAVENOUS | Status: DC | PRN
  Administered 2019-08-29: 15 mg via INTRAVENOUS

## 2019-08-29 MED ORDER — MORPHINE SULFATE (PF) 2 MG/ML IV SOLN
2.0000 mg | INTRAVENOUS | Status: DC | PRN
  Administered 2019-08-29: 3 mg via INTRAVENOUS
  Administered 2019-08-29: 2 mg via INTRAVENOUS
  Administered 2019-08-30: 3 mg via INTRAVENOUS
  Administered 2019-08-30: 2 mg via INTRAVENOUS
  Administered 2019-08-31 (×2): 3 mg via INTRAVENOUS
  Administered 2019-08-31 – 2019-09-05 (×19): 2 mg via INTRAVENOUS
  Administered 2019-09-06 (×2): 4 mg via INTRAVENOUS
  Administered 2019-09-06 (×2): 2 mg via INTRAVENOUS
  Administered 2019-09-06: 4 mg via INTRAVENOUS
  Administered 2019-09-06: 2 mg via INTRAVENOUS
  Administered 2019-09-06 – 2019-09-07 (×2): 4 mg via INTRAVENOUS
  Administered 2019-09-07 (×2): 2 mg via INTRAVENOUS
  Administered 2019-09-07 (×2): 4 mg via INTRAVENOUS
  Administered 2019-09-07 (×2): 2 mg via INTRAVENOUS
  Administered 2019-09-08: 4 mg via INTRAVENOUS
  Administered 2019-09-08 – 2019-09-09 (×2): 2 mg via INTRAVENOUS
  Filled 2019-08-29: qty 2
  Filled 2019-08-29 (×2): qty 1
  Filled 2019-08-29: qty 2
  Filled 2019-08-29 (×5): qty 1
  Filled 2019-08-29: qty 2
  Filled 2019-08-29 (×5): qty 1
  Filled 2019-08-29 (×2): qty 2
  Filled 2019-08-29: qty 1
  Filled 2019-08-29 (×3): qty 2
  Filled 2019-08-29 (×4): qty 1
  Filled 2019-08-29: qty 2
  Filled 2019-08-29 (×3): qty 1
  Filled 2019-08-29: qty 2
  Filled 2019-08-29: qty 1
  Filled 2019-08-29: qty 2
  Filled 2019-08-29 (×10): qty 1
  Filled 2019-08-29: qty 2

## 2019-08-29 SURGICAL SUPPLY — 45 items
APL PRP STRL LF DISP 70% ISPRP (MISCELLANEOUS) ×6
BLADE CLIPPER SURG (BLADE) ×2 IMPLANT
CANISTER SUCT 3000ML PPV (MISCELLANEOUS) ×5 IMPLANT
CHLORAPREP W/TINT 26 (MISCELLANEOUS) ×7 IMPLANT
COVER SURGICAL LIGHT HANDLE (MISCELLANEOUS) ×5 IMPLANT
DRAPE LAPAROSCOPIC ABDOMINAL (DRAPES) ×5 IMPLANT
DRAPE WARM FLUID 44X44 (DRAPES) ×5 IMPLANT
DRSG COVADERM 4X8 (GAUZE/BANDAGES/DRESSINGS) ×2 IMPLANT
DRSG OPSITE 6X11 MED (GAUZE/BANDAGES/DRESSINGS) ×2 IMPLANT
DRSG OPSITE POSTOP 4X10 (GAUZE/BANDAGES/DRESSINGS) IMPLANT
DRSG OPSITE POSTOP 4X8 (GAUZE/BANDAGES/DRESSINGS) ×2 IMPLANT
ELECT BLADE 6.5 EXT (BLADE) IMPLANT
ELECT CAUTERY BLADE 6.4 (BLADE) ×5 IMPLANT
ELECT REM PT RETURN 9FT ADLT (ELECTROSURGICAL) ×5
ELECTRODE REM PT RTRN 9FT ADLT (ELECTROSURGICAL) ×3 IMPLANT
GLOVE BIO SURGEON STRL SZ 6.5 (GLOVE) ×4 IMPLANT
GLOVE BIO SURGEONS STRL SZ 6.5 (GLOVE) ×1
GLOVE BIOGEL PI IND STRL 6 (GLOVE) ×3 IMPLANT
GLOVE BIOGEL PI INDICATOR 6 (GLOVE) ×2
GOWN STRL REUS W/ TWL LRG LVL3 (GOWN DISPOSABLE) ×6 IMPLANT
GOWN STRL REUS W/TWL LRG LVL3 (GOWN DISPOSABLE) ×10
HANDLE SUCTION POOLE (INSTRUMENTS) ×3 IMPLANT
KIT BASIN OR (CUSTOM PROCEDURE TRAY) ×5 IMPLANT
KIT TURNOVER KIT B (KITS) ×5 IMPLANT
LIGASURE IMPACT 36 18CM CVD LR (INSTRUMENTS) IMPLANT
NS IRRIG 1000ML POUR BTL (IV SOLUTION) ×10 IMPLANT
PACK GENERAL/GYN (CUSTOM PROCEDURE TRAY) ×5 IMPLANT
PAD ARMBOARD 7.5X6 YLW CONV (MISCELLANEOUS) ×5 IMPLANT
PENCIL SMOKE EVACUATOR (MISCELLANEOUS) ×5 IMPLANT
SPECIMEN JAR LARGE (MISCELLANEOUS) IMPLANT
SPONGE GAUZE 2X2 8PLY STER LF (GAUZE/BANDAGES/DRESSINGS) ×1
SPONGE GAUZE 2X2 8PLY STRL LF (GAUZE/BANDAGES/DRESSINGS) ×1 IMPLANT
SPONGE LAP 18X18 RF (DISPOSABLE) ×4 IMPLANT
STAPLER VISISTAT 35W (STAPLE) ×5 IMPLANT
SUCTION POOLE HANDLE (INSTRUMENTS) ×5
SUT PDS AB 1 TP1 54 (SUTURE) IMPLANT
SUT PDS AB 1 TP1 96 (SUTURE) ×2 IMPLANT
SUT SILK 2 0 SH CR/8 (SUTURE) ×5 IMPLANT
SUT SILK 2 0 TIES 10X30 (SUTURE) ×5 IMPLANT
SUT SILK 3 0 SH CR/8 (SUTURE) ×7 IMPLANT
SUT SILK 3 0 TIES 10X30 (SUTURE) ×5 IMPLANT
SUT VIC AB 3-0 SH 18 (SUTURE) IMPLANT
TOWEL GREEN STERILE (TOWEL DISPOSABLE) ×5 IMPLANT
TRAY FOLEY MTR SLVR 16FR STAT (SET/KITS/TRAYS/PACK) ×2 IMPLANT
YANKAUER SUCT BULB TIP NO VENT (SUCTIONS) ×2 IMPLANT

## 2019-08-29 NOTE — Congregational Nurse Program (Cosign Needed Addendum)
Chief Complaint: Hemodialysis access  Referring Physician(s):E. Haverhill PA  Supervising Physician: Corrie Mckusick  Patient Status: [facility] - In-pt  History of Present Illness: Aaron [name] is a 64 y.o. male History of ESRD on PD, a fib (on eliquis) presented with SHOB  Found to be in a fib with RVR with a SBO. Team is requesting temp cath for HD access. Patient is scheduled for an exploratory lap on 5.17.21  Past Medical History:  Diagnosis Date  . Anxiety   . Arthritis    knees , back , shoulders (10/12/2017)  . Atrial fibrillation ([place])   . CAD (coronary artery disease)    Mild nonobstructive disease at cardiac catheterization 2002  . Chronic back pain    "back of my neck; down thru my legs" (10/12/2017)  . COPD (chronic obstructive pulmonary disease) ([place])   . Diverticulitis   . Esophageal reflux   . ESRD (end stage renal disease) on dialysis [facility])    "TTS; [place]" (11/23/2017)  . Essential hypertension   . Hepatitis C    states he no longer has this  . History of kidney stones   . History of syncope   . Hyperlipidemia   . Jerking 09/23/2014  . Myocardial infarction ([place]) 02/2017   "light one" (10/12/2017)  . Non-compliant behavior    non compliant with diaylsis per daughter  . PAT (paroxysmal atrial tachycardia) ([place])   . Peripheral arterial disease ([place])    Occluded left superficial femoral artery status post stent June 2016 - Dr. Trula Slade  . Pneumonia 1961  . Polycystic kidney, unspecified type   . Syncope 09/2014  . SYNCOPE 05/07/2010   Qualifier: Diagnosis of  By: Laurance Flatten RN, BSN, Anderson Malta      Past Surgical History:  Procedure Laterality Date  . ABDOMINAL AORTOGRAM N/A 08/11/2017   Procedure: ABDOMINAL AORTOGRAM;  Surgeon: Serafina Mitchell, MD;  Location: [place] CV LAB;  Service: Cardiovascular;  Laterality: N/A;  . ANTERIOR CERVICAL DECOMP/DISCECTOMY FUSION    . APPLICATION OF WOUND VAC Right 08/14/2017   Procedure: APPLICATION OF PREVENA INCISIONAL WOUND VAC  RIGHT GROIN;  Surgeon: Serafina Mitchell, MD;  Location: [facility] OR;  Service: Vascular;  Laterality: Right;  . AV FISTULA PLACEMENT Left 09/04/2017   Procedure: ARTERIOVENOUS (AV) FISTULA CREATION LEFT UPPER ARM;  Surgeon: Serafina Mitchell, MD;  Location: [place];  Service: Vascular;  Laterality: Left;  . BACK SURGERY    . BASCILIC VEIN TRANSPOSITION Left 11/20/2017   Procedure: SECOND STAGE BASILIC VEIN TRANSPOSITION LEFT ARM;  Surgeon: Serafina Mitchell, MD;  Location: [place];  Service: Vascular;  Laterality: Left;  . BIOPSY  12/17/2016   Procedure: BIOPSY;  Surgeon: Daneil Dolin, MD;  [facility];  Service: Gastroenterology;;  gastric colon  . BIOPSY  04/29/2017   Procedure: BIOPSY;  Surgeon: Daneil Dolin, MD;  [facility];  Service: Endoscopy;;  duodenal biopsies  . BUNIONECTOMY Bilateral   . COLONOSCOPY  2008   Dr. Oneida Alar: rare sigmoid colon diverticulosis, internal hemorrhoids.   . COLONOSCOPY WITH PROPOFOL N/A 12/17/2016   dense left-sided diverticulosis, right colon ulcers s/p biopsy query occult NSAID use vs transient ischemia, not consistent with IBD. CMV stains negative.   Marland Kitchen ENDARTERECTOMY FEMORAL Right 08/14/2017   Procedure: RIGHT ILLIO-FEMORAL ENDARTERECTOMY;  Surgeon: Serafina Mitchell, MD;  Location[facility] OR;  Service: Vascular;  Laterality: Right;  . ESOPHAGEAL DILATION  12/17/2016   EGD with mild Schatzki's ring s/p dilatation, small  hiatal hernia, erosive gastropathy (negative H.pylori gastritis)  . ESOPHAGOGASTRODUODENOSCOPY  2008   Dr. Oneida Alar: normal esophagus without Barrett's, antritis and duodenitis, path with H.pylori gastritis  . ESOPHAGOGASTRODUODENOSCOPY (EGD) WITH PROPOFOL N/A 12/17/2016   Procedure: ESOPHAGOGASTRODUODENOSCOPY (EGD) WITH PROPOFOL;  Surgeon: Daneil Dolin, MD;  [facility];  Service: Gastroenterology;  Laterality: N/A;  . ESOPHAGOGASTRODUODENOSCOPY (EGD) WITH PROPOFOL N/A 04/29/2017   Patchy erythema of gastric mucosa diffusely,  extensive inflammatory changes in duodenum, geographic ulceration and mucosal edema present, encroaching somewhat on the lumen yet still widely patent, distal second portion of duodenum appeared abnormal, path with peptic duodenitis with ulceration  . GROIN DEBRIDEMENT Right 10/14/2017   Procedure: RIGHT GROIN AND RIGHT LOWER QUADRANT ABDOMEN DEBRIDEMENT WITH PLACEMENT OF ANTIBIOTIC BEADS;  Surgeon: Serafina Mitchell, MD;  Location: [place];  Service: Vascular;  Laterality: Right;  . HEMATOMA EVACUATION Left 12/06/2017   Procedure: EVACUATION HEMATOMA;  Surgeon: Waynetta Sandy, MD;  Location: [place];  Service: Vascular;  Laterality: Left;  . HERNIA REPAIR  8299   umbilical  . I & D EXTREMITY Right 09/21/2017   Procedure: IRRIGATION AND DEBRIDEMENT GROIN;  Surgeon: Elam Dutch, MD;  Location: [place];  Service: Vascular;  Laterality: Right;  . I & D EXTREMITY Left 11/23/2017   Procedure: Evacuation Hematoma LEFT UPPER ARM GRAFT;  Surgeon: Elam Dutch, MD;  Location: [place];  Service: Vascular;  Laterality: Left;  . INGUINAL HERNIA REPAIR Right 02/2017   [place]  . INSERTION OF DIALYSIS CATHETER Right 09/04/2017   Procedure: INSERTION OF TUNNELED  DIALYSIS CATHETER - RIGHT INTERNAL JUGULAR PLACEMENT;  Surgeon: Serafina Mitchell, MD;  Location: [place];  Service: Vascular;  Laterality: Right;  . INSERTION OF DIALYSIS CATHETER Right 07/26/2018   Procedure: INSERTION OF DIALYSIS CATHETER RIGHT INTERNAL JUGULAR;  Surgeon: Marty Heck, MD;  Location: [place];  Service: Vascular;  Laterality: Right;  . INSERTION OF ILIAC STENT Right 08/14/2017   Procedure: INSERTION OF RIGHT COMMON ILIAC STENT 24mm x 41mm x 130cm INSERTION OF RIGHT EXTERNAL ILIAC STENT 53mm x 73mm x 130cm INSERTION OF SUPERFICIAL FERMORAL ARTERY STENT 36mm x 51mm x 130cm;  Surgeon: Serafina Mitchell, MD;  Location: [facility] OR;  Service: Vascular;  Laterality: Right;  . IR FLUORO GUIDE CV LINE RIGHT  08/31/2017  . IR REMOVAL TUN CV CATH  W/O FL  05/28/2018  . IR REMOVAL TUN CV CATH W/O FL  03/07/2019  . IR US GUIDE VASC ACCESS RIGHT  08/31/2017  . LOWER EXTREMITY ANGIOGRAPHY Right 08/11/2017   Procedure: LOWER EXTREMITY ANGIOGRAPHY;  Surgeon: Serafina Mitchell, MD;  Location: [place] CV LAB;  Service: Cardiovascular;  Laterality: Right;  . PATCH ANGIOPLASTY Right 08/14/2017   Procedure: PATCH ANGIOPLASTY USING HEMASHIELD PATCH 0.3IN Lillie Columbia;  Surgeon: Serafina Mitchell, MD;  [facility];  Service: Vascular;  Laterality: Right;  . PERIPHERAL VASCULAR CATHETERIZATION N/A 09/20/2014   Procedure: Abdominal Aortogram;  Surgeon: Serafina Mitchell, MD;  Location: [place] CV LAB;  Service: Cardiovascular;  Laterality: N/A;  . POSTERIOR FUSION LUMBAR SPINE    . TEE WITHOUT CARDIOVERSION N/A 01/12/2018   Procedure: TRANSESOPHAGEAL ECHOCARDIOGRAM (TEE);  Surgeon: Lelon Perla, MD;  Location: [facility] ENDOSCOPY;  Service: Cardiovascular;  Laterality: N/A;    Allergies: Patient has no known allergies.  Medications: Prior to Admission medications   Medication Sig Start Date End Date Taking? Authorizing Provider  albuterol (PROVENTIL HFA;VENTOLIN HFA) 108 (90 Base) MCG/ACT inhaler Inhale  2 puffs into the lungs every 6 (six) hours as needed for wheezing or shortness of breath.   Yes [provider]  albuterol (PROVENTIL) (2.5 MG/3ML) 0.083% nebulizer solution Inhale 3 mLs into the lungs 3 (three) times daily as needed for shortness of breath or wheezing. 06/23/18  Yes [provider]  BREO ELLIPTA 200-25 MCG/INH AEPB Inhale 1 puff into the lungs daily as needed (for respiratory issues.).  07/27/17  Yes [provider]  carvedilol (COREG) 25 MG tablet Take 25 mg by mouth 2 (two) times daily. 04/25/19  Yes [provider]  diltiazem (CARDIZEM) 60 MG tablet Take 1 tablet (60 mg total) by mouth every 6 (six) hours. 05/28/19  Yes Vann, Jessica U, DO  gabapentin (NEURONTIN) 300 MG capsule Take 300 mg by mouth 2 (two)  times daily. 07/07/18  Yes [provider]  hydrALAZINE (APRESOLINE) 25 MG tablet Take 1 tablet (25 mg total) by mouth every 8 (eight) hours. 05/28/19  Yes Geradine Girt, DO  oxyCODONE-acetaminophen (PERCOCET) 10-325 MG tablet Take 1 tablet by mouth 5 (five) times daily as needed for pain.    Yes [provider]  pantoprazole (PROTONIX) 40 MG tablet Take 1 tablet (40 mg total) by mouth 2 (two) times daily. Patient taking differently: Take 40 mg by mouth daily.  04/30/17 01/27/26 Yes Shah, Pratik D, DO  varenicline (CHANTIX) 0.5 MG tablet Take 0.5 mg by mouth 2 (two) times daily.   Yes [provider]  apixaban (ELIQUIS) 2.5 MG TABS tablet Take 2.5 mg by mouth 2 (two) times daily.    [provider]  atorvastatin (LIPITOR) 80 MG tablet Take 80 mg by mouth daily. 04/25/19   [provider]  calcium acetate (PHOSLO) 667 MG capsule Take 667 mg by mouth 3 (three) times daily with meals. 06/29/18   [provider]  clopidogrel (PLAVIX) 75 MG tablet Take 75 mg by mouth daily. 06/28/18   [provider]  furosemide (LASIX) 40 MG tablet Take 40 mg by mouth daily.    [provider]  isosorbide mononitrate (IMDUR) 60 MG 24 hr tablet Take 60 mg by mouth daily. 07/07/18   [provider]  lisinopril (ZESTRIL) 20 MG tablet Take 20 mg by mouth daily.    [provider]  sodium bicarbonate 650 MG tablet Take 1 tablet (650 mg total) by mouth daily. Patient not taking: Reported on 08/23/2019 04/04/18   Orson Eva, MD     Family History  Problem Relation Age of Onset  . Alcoholism Mother   . Heart disease Father        Massive heart attack  . Heart attack Father   . Atrial fibrillation Father   . Colon cancer Father   . Colon cancer Maternal Grandfather 50  . Alcoholism Maternal Grandfather   . Renal cancer Cousin   . Ovarian cancer Sister     Social History   Socioeconomic History  . Marital status: Legally Separated     Spouse name: Not on file  . Number of children: 2  . Years of education: Not on file  . Highest education level: Not on file  Occupational History  . Occupation: DISABLED  Tobacco Use  . Smoking status: Current Every Day Smoker    Packs/day: 0.25    Years: 47.00    Pack years: 11.75    Types: Cigarettes    Last attempt to quit: 08/03/2017    Years since quitting: 2.0  . Smokeless tobacco:  Never Used  Substance and Sexual Activity  . Alcohol use: Not Currently    Comment: 10/12/2017 alcohol free since 2017,  heavy drinker in the past  . Drug use: Not Currently    Comment: "<2003 whatever was around; nothing since"  . Sexual activity: Not Currently  Other Topics Concern  . Not on file  Social History Narrative   Disabled from Back since 2007   Social Determinants of Health   Financial Resource Strain:   . Difficulty of Paying Living Expenses:   Food Insecurity:   . Worried About Charity fundraiser in the Last Year:   . Arboriculturist in the Last Year:   Transportation Needs:   . Film/video editor (Medical):   Marland Kitchen Lack of Transportation (Non-Medical):   Physical Activity:   . Days of Exercise per Week:   . Minutes of Exercise per Session:   Stress:   . Feeling of Stress :   Social Connections:   . Frequency of Communication with Friends and Family:   . Frequency of Social Gatherings with Friends and Family:   . Attends Religious Services:   . Active Member of Clubs or Organizations:   . Attends Archivist Meetings:   Marland Kitchen Marital Status:      Review of Systems: A 12 point ROS discussed and pertinent positives are indicated in the HPI above.  All other systems are negative.  Review of Systems  Constitutional: Negative for fever.  HENT: Negative for congestion.   Respiratory: Negative for cough and shortness of breath.   Cardiovascular: Negative for chest pain.  Gastrointestinal: Positive for abdominal pain.  Neurological: Negative for headaches.    Psychiatric/Behavioral: Negative for behavioral problems and confusion.    Vital Signs: BP 101/86 (BP Location: Right Arm)   Pulse (!) 56   Temp 97.9 F (36.6 C) (Oral)   Resp 12   Ht 6\' 4"  (1.93 m)   Wt 179 lb 10.8 oz (81.5 kg)   SpO2 95%   BMI 21.87 kg/m   Physical Exam Vitals and nursing note reviewed.  Constitutional:      Appearance: He is well-developed.  HENT:     Head: Normocephalic.  Cardiovascular:     Rate and Rhythm: Normal rate and regular rhythm.  Pulmonary:     Effort: Pulmonary effort is normal.     Breath sounds: Decreased breath sounds and wheezing present.  Abdominal:     Comments: Distended. NG tube in place  Musculoskeletal:        General: Normal range of motion.     Cervical back: Normal range of motion.  Skin:    General: Skin is dry.  Neurological:     Mental Status: He is alert and oriented to person, place, and time.     Imaging: DG Abd 1 View  Result Date: 08/26/2019 CLINICAL DATA:  Small bowel obstruction. EXAM: ABDOMEN - 1 VIEW COMPARISON:  CT abdomen pelvis from yesterday. FINDINGS: Enteric tube in the stomach. Peritoneal dialysis catheter in the pelvis. Multiple dilated loops of small bowel measuring up to 5.0 cm in diameter, progressed from yesterday's CT. No radio-opaque calculi or other significant radiographic abnormality are seen. No acute osseous abnormality. Right iliac artery stents. IMPRESSION: 1. Mildly worsened small bowel obstruction. Electronically Signed   By: Titus Dubin M.D.   On: 08/26/2019 12:01   CT ABDOMEN PELVIS W CONTRAST  Result Date: 08/25/2019 CLINICAL DATA:  Nausea, vomiting. EXAM: CT ABDOMEN AND PELVIS WITH  CONTRAST TECHNIQUE: Multidetector CT imaging of the abdomen and pelvis was performed using the standard protocol following bolus administration of intravenous contrast. CONTRAST:  189mL OMNIPAQUE IOHEXOL 300 MG/ML  SOLN COMPARISON:  November 27, 2018. FINDINGS: Lower chest: Small left pleural effusion is  noted with adjacent subsegmental atelectasis. Hepatobiliary: No gallstones or biliary dilatation is noted. Multiple cysts are again noted in the patent parenchyma. Pancreas: Unremarkable. No pancreatic ductal dilatation or surrounding inflammatory changes. Spleen: Normal in size without focal abnormality. Adrenals/Urinary Tract: Adrenal glands appear normal. Continued presence of findings consistent with severe polycystic kidney disease. No hydronephrosis or renal obstruction is noted. Urinary bladder is unremarkable. 5.4 x 5.1 cm hyperdense abnormality is seen involving the anterior aspect of the superior pole of the left kidney which is enlarged compared to prior exam. This may represent enlarging hyperdense cyst, but possible neoplasm cannot be excluded. Further evaluation with MRI or ultrasound is recommended. Stomach/Bowel: Mild gastric distention is noted. No colonic obstruction is noted. The appendix is not visualized. Proximal small bowel dilatation is noted with possible transition zone seen in the left upper quadrant best seen on image number 41 of series 3. More distal small bowel is not dilated. This is concerning for possible small bowel obstruction, potentially due to adhesion. Vascular/Lymphatic: Aortic atherosclerosis. No enlarged abdominal or pelvic lymph nodes. Reproductive: Prostate is unremarkable. Other: Distal tip of peritoneal dialysis catheter is noted anteriorly within the pelvis. Mild amount of free fluid is noted in the more posterior and dependent portion of the pelvis. Musculoskeletal: Extensive postsurgical and degenerative changes are seen involving the lumbar spine. No acute abnormality is noted. IMPRESSION: 1. Continued presence of findings consistent with severe polycystic kidney disease. 5.4 x 5.1 cm hyperdense abnormality is seen involving anterior aspect of superior pole of left kidney which is enlarged compared to prior exam. This may represent enlarging hyperdense cyst, but  possible neoplasm cannot be excluded. Further evaluation with MRI or ultrasound is recommended. 2. Proximal small bowel dilatation is noted with possible transition zone seen in the left upper quadrant best seen on image number 41 of series 3. More distal small bowel is not dilated. This is concerning for possible small bowel obstruction, potentially due to adhesion. 3. Mild amount of free fluid is noted in the more posterior and dependent portion of the pelvis. 4. Small left pleural effusion is noted with adjacent subsegmental atelectasis. Aortic Atherosclerosis (ICD10-I70.0). Electronically Signed   By: Marijo Conception M.D.   On: 08/25/2019 20:28   DG CHEST PORT 1 VIEW  Result Date: 08/25/2019 CLINICAL DATA:  Shortness of breath. EXAM: PORTABLE CHEST 1 VIEW COMPARISON:  Chest x-ray dated Aug 22, 2019. FINDINGS: The heart size and mediastinal contours are within normal limits. Atherosclerotic calcification of the aortic arch. Normal pulmonary vascularity. Unchanged bibasilar atelectasis/scarring. Emphysematous changes again noted. No focal consolidation, pleural effusion, or pneumothorax. No acute osseous abnormality. IMPRESSION: No active disease.  Emphysema. Electronically Signed   By: Titus Dubin M.D.   On: 08/25/2019 11:27   DG Chest Port 1 View  Result Date: 08/22/2019 CLINICAL DATA:  Shortness of breath congestion EXAM: PORTABLE CHEST 1 VIEW COMPARISON:  May 26, 2019 FINDINGS: There is unchanged cardiomegaly. Aortic knob calcifications are seen. There is hyperinflation of the upper lung zones. Ill-defined interstitial reticulonodular opacity seen at the right lung base, more pronounced than on the prior exam. No pleural effusion is seen. No acute osseous abnormality. IMPRESSION: Interstitial reticulonodular opacity more pronounced at the right lung base than  on the prior exam which could be due to atelectasis and/or infectious etiology. Findings suggestive of underlying COPD. Electronically  Signed   By: Prudencio Pair M.D.   On: 08/22/2019 19:41   DG Abd Portable 1V  Result Date: 08/29/2019 CLINICAL DATA:  Small bowel obstruction. EXAM: PORTABLE ABDOMEN - 1 VIEW COMPARISON:  Aug 27, 2019. FINDINGS: Distal tip of nasogastric tube is seen in expected position of distal stomach. Stable small bowel dilatation is noted concerning for distal small bowel obstruction. IMPRESSION: Distal tip of nasogastric tube is seen in expected position of distal stomach. Electronically Signed   By: Marijo Conception M.D.   On: 08/29/2019 08:50   DG Abd Portable 1V  Result Date: 08/27/2019 CLINICAL DATA:  Follow-up for small bowel obstruction. EXAM: PORTABLE ABDOMEN - 1 VIEW COMPARISON:  08/26/2019 and CT from 08/25/2019. FINDINGS: There are dilated small bowel loops predominantly in the left abdomen trauma similar to the previous day's study. Nasogastric tube tip projects in the distal stomach. IMPRESSION: 1. Persistent small-bowel dilation consistent with a partial small bowel obstruction, without significant change from the previous day's study. Electronically Signed   By: Lajean Manes M.D.   On: 08/27/2019 13:59   DG Abd Portable 1V-Small Bowel Obstruction Protocol-initial, 8 hr delay  Result Date: 08/26/2019 CLINICAL DATA:  9 hour delay, small-bowel protocol EXAM: PORTABLE ABDOMEN - 1 VIEW COMPARISON:  Radiograph 08/26/2019, CT 08/25/2019 FINDINGS: Multiple stacked air distended loops of small bowel are present. High-density contrast appears largely retained within the distended gastric lumen. Transesophageal tube tip and side port remain within the gastric body. No evidence of free intraperitoneal air. Basilar atelectatic changes. Additional support devices overlie the chest and abdomen. Foley catheter in place. Degenerative changes in the spine, hips and pelvis. IMPRESSION: Retention of contrast within the distended gastric lumen. Persistently dilated loops of small bowel compatible with obstruction or ileus  though the lack of transit is somewhat suggestive of adynamic bowel motility. Electronically Signed   By: Lovena Le M.D.   On: 08/26/2019 21:21   DG Foot Complete Left  Result Date: 08/22/2019 CLINICAL DATA:  [place], great toe pain EXAM: LEFT FOOT - COMPLETE 3+ VIEW COMPARISON:  12/23/2017 FINDINGS: Frontal, oblique, and lateral views of the left foot are obtained. Chronic posttraumatic/postsurgical changes at the first metatarsal. Prior healed second metatarsal fracture unchanged. 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 Diffuse hammertoe deformities are noted. There are no acute displaced fractures. Prominent dorsal soft tissue swelling of the foot. Diffuse vascular calcifications. IMPRESSION: 1. Chronic postsurgical/posttraumatic changes of the first and second metatarsal. 2. Stable severe first metatarsophalangeal joint osteoarthritis. 3. Dorsal soft tissue swelling. 4. No acute fracture. Electronically Signed   By: Randa Ngo M.D.   On: 08/22/2019 19:41   [place] EKG SITE RITE  Result Date: 08/29/2019 If Site Rite image not attached, placement could not be confirmed due to current cardiac rhythm.   Labs:  CBC: Recent Labs    08/24/19 0908 08/25/19 1740 08/27/19 0526 08/28/19 0324  WBC 13.5* 20.8* 16.3* 10.7*  HGB 12.1* 12.7* 12.9* 12.8*  HCT 37.6* 39.2 39.9 40.0  PLT 266 340 276 258    COAGS: Recent Labs    05/26/19 1541  INR 1.0  APTT 31    BMP: Recent Labs    08/26/19 0631 08/27/19 0526 08/28/19 0324 08/29/19 0345  NA 142 143 140 139  K 3.6 3.9 3.2* 3.2*  CL 96* 99 101 96*  CO2 29 30 28  30  GLUCOSE 155* 93 135* 163*  BUN 66* 67* 61* 49*  CALCIUM 8.2* 7.6* 7.4* 7.8*  CREATININE 6.89* 6.80* 6.39* 6.12*  GFRNONAA 8* 8* 8* 9*  GFRAA 9* 9* 10* 10*    LIVER FUNCTION TESTS: Recent Labs    05/26/19 1541 08/24/19 0908 08/26/19 0631 08/27/19 0526 08/28/19 0324 08/29/19 0345  BILITOT 0.5  --   --  1.0 0.6 0.6  AST 15  --   --   18 13* 15  ALT 13  --   --  12 9 9   ALKPHOS 115  --   --  78 78 81  PROT 6.1*  --   --  5.6* 5.7* 6.1*  ALBUMIN 2.5*   < > 2.4* 2.1* 1.9* 2.1*   < > = values in this interval not displayed.    Assessment and Plan:  64 y.o, male inpatient. History of ESRD on PD, a fib (on eliquis) presented with [facility]  Found to be in a fib with RVR with a SBO. Team is requesting temp cath for HD access. Patient is scheduled for an exploratory lap on 5.17.21  Pertinent Imaging 5.13.21 - chest xray   Pertinent IR History 11.23.20 - Removal RIJ HD cath 2.14.20- Removal RIJ HD ath 5.20.19 - Placement RIJ catheter  Pertinent Allergies NKDA  WBC is 10.7, BUN 49 Cr 6.12, All  otherlabs and medications are within acceptable parameters.  Patient is afebrile.patient is on eliquis last dose given on 5.13.21 @ 10:03.   IR consulted for possible temp hd catheter placement. Case has been reviewed and procedure approved Patient tentatively scheduled for 5.18.21.  Team instructed to: Keep Patient to be NPO after midnight Hold prophylactic anticoagulation 24 hours prior to scheduled procedure.   IR will call patient when ready.   Risks and benefits discussed with the patient including, but not limited to bleeding, infection, vascular injury, pneumothorax which may require chest tube placement, air embolism or even death  All of the patient's questions were answered, patient is agreeable to proceed.  Consent signed and in IR control room   Thank you for this interesting consult.  I greatly enjoyed meeting DIVIT STIPP and look forward to participating in their care.  A copy of this report was sent to the requesting provider on this date.  Electronically Signed: Avel Peace, NP 08/29/2019, 12:29 PM   I spent a total of 40 Minutes    in face to face in clinical consultation, greater than 50% of which was counseling/coordinating care for temp HD catheter placement

## 2019-08-29 NOTE — Progress Notes (Signed)
Initial Nutrition Assessment  RD working remotely.  DOCUMENTATION CODES:   Not applicable  INTERVENTION:   -TPN management per pharmacy -RD will follow for diet advancement and adjust supplement regimen as appropriate  NUTRITION DIAGNOSIS:   Inadequate oral intake related to altered GI function as evidenced by NPO status.  GOAL:   Patient will meet greater than or equal to 90% of their needs  MONITOR:   Diet advancement, Labs, Weight trends, Skin, I & O's  REASON FOR ASSESSMENT:   Rounds, Other (Comment)(New TPN)    ASSESSMENT:   Aaron [name] is a 64 y.o. male with medical history significant of hypertension hyperlipidemia, atrial fibrillation on Eliquis, ESRD on peritoneal dialysis presented to ED for evaluation of worsening shortness of breath  Pt admitted with exertion dyspnea due to COPD exacerbation.   5/13- CT reveals partial SBO 5/14- NGT placed 5/16- clamp NGT, advanced to clear liquid diet 5/17- NPO  Reviewed I/O's: -1.3 L x 24 hours and -1.1 L since admission  UOP: 300 ml x 24 hours  NGT output: 1.9 L x 24 hours  Case discussed with pharmacist, who reports plan to start TPN today.   Per general surgery notes, pt not improving and will require ex lap today (may require ostomy); MD doubts obstruction will resolve without surgery. Pt will require vascath placement vs obtaining access in OR.   Attempted to speak with pt via phone, however, no answer.  Reviewed wt hx; pt wt has been stable over the past year.    Labs reviewed: K: 3.2, Phos: 5.9 (on Phoslo), Mg WDL.   Diet Order:   Diet Order            Diet NPO time specified Except for: Sips with Meds  Diet effective now              EDUCATION NEEDS:   No education needs have been identified at this time  Skin:  Skin Assessment: Reviewed RN Assessment  Last BM:  08/23/19  Height:   Ht Readings from Last 1 Encounters:  08/22/19 6\' 4"  (1.93 m)    Weight:   Wt Readings from Last 1  Encounters:  08/29/19 81.5 kg    Ideal Body Weight:  91.8 kg  BMI:  Body mass index is 21.87 kg/m.  Estimated Nutritional Needs:   Kcal:  3976-7341  Protein:  125-140 grams  Fluid:  1000 mg + UOP    Loistine Chance, RD, LDN, CDCES Registered Dietitian II Certified Diabetes Care and Education Specialist Please refer to [facility] for RD and/or RD on-call/weekend/after hours pager

## 2019-08-29 NOTE — Progress Notes (Signed)
Peripherally Inserted Central Catheter Placement  The IV Nurse has discussed with the patient and/or persons authorized to consent for the patient, the purpose of this procedure and the potential benefits and risks involved with this procedure.  The benefits include less needle sticks, lab draws from the catheter, and the patient may be discharged home with the catheter. Risks include, but not limited to, infection, bleeding, blood clot (thrombus formation), and puncture of an artery; nerve damage and irregular heartbeat and possibility to perform a PICC exchange if needed/ordered by physician.  Alternatives to this procedure were also discussed.  Bard Power PICC patient education guide, fact sheet on infection prevention and patient information card has been provided to patient /or left at bedside. PICC placed by Carolee Rota RN   PICC Placement Documentation  PICC Double Lumen 08/29/19 PICC Left Brachial 47 cm 0 cm (Active)  Indication for Insertion or Continuance of Line Administration of hyperosmolar/irritating solutions (i.e. TPN, Vancomycin, etc.) 08/29/19 2244  Exposed Catheter (cm) 0 cm 08/29/19 2244  Site Assessment Clean;Dry;Intact 08/29/19 2244  Lumen #1 Status Blood return noted;Flushed;Saline locked 08/29/19 2244  Lumen #2 Status Blood return noted;Flushed;Saline locked 08/29/19 2244  Dressing Type Transparent;Occlusive;Securing device 08/29/19 2244  Dressing Status Clean;Dry;Intact;Antimicrobial disc in place 08/29/19 2244  Line Adjustment (NICU/IV Team Only) No 08/29/19 2244  Dressing Intervention New dressing;Other (Comment) 08/29/19 2244  Dressing Change Due 09/05/19 08/29/19 2244       Edson Snowball 08/29/2019, 10:48 PM

## 2019-08-29 NOTE — Progress Notes (Signed)
Consent for PICC obtained via telephone.

## 2019-08-29 NOTE — Progress Notes (Signed)
PICC difficult to get to drop into SVC, required use of Radiology microintroducer kit 70 cm floppy buddy wire with success.

## 2019-08-29 NOTE — Plan of Care (Signed)
  Problem: Pain Managment: Goal: General experience of comfort will improve Outcome: Not Progressing   

## 2019-08-29 NOTE — Progress Notes (Signed)
  PHARMACY - TOTAL PARENTERAL NUTRITION CONSULT NOTE   Indication: Small bowel obstruction since 08/25/19  Patient Measurements: Height: 6\' 4"  (193 cm) Weight: 81.5 kg (179 lb 10.8 oz) IBW/kg (Calculated) : 86.8 TPN AdjBW (KG): 86.6 Body mass index is 21.87 kg/m. Usual Weight: 180 lbs  Assessment:  64 yo M admitted for COPD exacerbation and afib who developed SBO on 08/25/19. Last PO intake was 5/12 so patient is at moderate risk for refeeding syndrome. Weight has been stable at 180-200 lbs in the last 5 years with no weight loss until this admission, confirmed with patient.   Glucose / Insulin: BG <180 since stopping methylpredisolone. A1C 5.9 Electrolytes: K 3.2 > IV 40 mEq, Mg 1.9, Phos 5.9 on 5/16 Renal: ESRD on PD, switch to HD per Gen Surg due to suboptimal dialysis on PD and abdominal surgery LFTs / TGs: Wnl  Prealbumin / albumin: Albumin 2.1  Intake / Output; MIVF: NGT out 1.9L, still makes urine 300-675ml/d GI Imaging: 5/13 CT concerning for possible small bowel obstruction Surgeries / Procedures: Planning 5/17 ex lap  Central access: IV team consulted for PICC line, HD catheter to be placed in OR  TPN start date: 08/29/19  Nutritional Goals (RD recommendation pending): kCal: 2000-2600, Protein: 120g, Fluid: minimize for ESRD Goal TPN rate is 70 mL/hr (provides 126g of protein, 302g dextrose, 59g lipids and 2120 kcals per day)  Current Nutrition:  NPO  Plan:  Start TPN at 56mL/hr at 1800. TPN today provides: AA 126g, Dextrose 302g, Lipids 59g, 908 Kcal). Using Clinisol 15% for ESRD patient.  Electrolytes in TPN: 20mEq/L of Na, 45mEq/L of Mg; no K, Ca, or Phos.  Max Cl  Add standard MVI and trace elements MWF (due to ESRD) to TPN Initiate very Sensitive Q4h SSI and adjust as needed  Monitor TPN labs daily until at goal and stable, then on Mon/Thurs only  Benetta Spar, PharmD, BCPS, [facility] Clinical Pharmacist  Please check AMION for all [place] phone numbers After  10:00 PM, call Reader

## 2019-08-29 NOTE — Progress Notes (Signed)
PROGRESS NOTE    Aaron [name]  ZOX:[id number] DOB: [birth year] DOA: 08/22/2019 PCP: Gwenlyn Saran Burkburnett    Brief Narrative:   64 year old gentleman with medical history significant for hypertension, hyperlipidemia, chronic atrial fibrillation on Eliquis, ESRD on peritoneal dialysis presented to the emergency department for evaluation of shortness of breath.  Patient does have history of COPD, he recently quit his smoking few months ago.  He does have some wheezing at baseline.  Came to the ER with worsening shortness of breath. In the emergency room, he was comfortable, found to have A. fib with RVR started on Cardizem drip.  Transfer to [facility] for progressive bed from [facility]. 5/13: Patient started having intermittent biliary vomiting.  CT scan shows small bowel obstruction.  Surgery consulted in the morning 5/14. 5/17: No improvement in a small bowel obstruction.  Going for ex lap.   Assessment & Plan:   Principal Problem:   COPD with acute exacerbation ([place]) Active Problems:   Essential hypertension   ESRD on dialysis ([place])   Exertional dyspnea   Acute exacerbation of CHF (congestive heart failure) (HCC)   Atrial fibrillation with RVR (HCC)  COPD with acute exacerbation: Improved.  Continue breathing treatments and mobility.  Bronchodilator as needed.  Now off oxygen.  Small bowel obstruction secondary to adhesions: No improvement with conservative management.  NG tube with significant output.  Followed by surgery.   For surgery today.     Holding Eliquis and Plavix, 5/13.  Starting on TPN. Replace potassium.  Chronic atrial fibrillation with RVR: Heart rate better acceptable.  Unable to take by mouth.  Will change to metoprolol IV.  Hold all oral medications. Eliquis on hold.    ESRD on peritoneal dialysis: Followed by nephrology.  Medication adjustment as per nephrology. Patient going for surgery, needing for hemodialysis.  IR/surgery to  do permacath today for hemodialysis.  DVT prophylaxis: SCDs.  Eliquis on hold. Code Status: Full code Family Communication: Patient's daughter called and updated 5/15.  Surgery communicated with her today. Disposition Plan: Status is: Inpatient  Remains inpatient appropriate because:Inpatient level of care appropriate due to severity of illness   Dispo: The patient is from: Home              Anticipated d/c is to: Home              Anticipated d/c date is: More than 3 days.              Patient currently is not medically stable to d/c.  Patient has small bowel obstruction, needing active inpatient treatment and management.  Going for exploratory laparotomy today.  Consultants:   Nephrology  General surgery  Procedures:   Peritoneal dialysis  Antimicrobials:   Azithromycin, 5/11--- 5/13   Subjective: Seen and examined.  Overnight bloated, went back on NG tube.  "They need to do surgery and fix my belly" denies  shortness of breath.  Wheezing is better. Objective: Vitals:   08/29/19 0400 08/29/19 0444 08/29/19 0736 08/29/19 1111  BP: (!) 86/67 97/78 98/65  101/86  Pulse: (!) 55 77 94 (!) 56  Resp: 19  18 12   Temp: 97.8 F (36.6 C)  97.6 F (36.4 C) 97.9 F (36.6 C)  TempSrc: Oral  Oral Oral  SpO2: 99%  94% 95%  Weight:  81.5 kg    Height:        Intake/Output Summary (Last 24 hours) at 08/29/2019 1135 Last data filed at 08/29/2019 0700  Gross per 24 hour  Intake 15827.76 ml  Output 17102 ml  Net -1274.24 ml   Filed Weights   08/27/19 0542 08/28/19 0300 08/29/19 0444  Weight: 80.1 kg 82.1 kg 81.5 kg    Examination:  General exam: Appears calm and comfortable, chronically sick looking  .  On room air. Respiratory system: Bilateral clear.  No added sounds.  Some conducted airway sounds. Cardiovascular system: S1 & S2 heard, irregularly irregular, No JVD, murmurs, rubs, gallops or clicks. 1+ bilateral pedal edema . Gastrointestinal system: Abdomen is soft.   Nontender.  Distended.  No bowel sounds. PD catheter intact and draining. Central nervous system: Alert and oriented. No focal neurological deficits. Extremities: Symmetric 5 x 5 power. Skin: No rashes, lesions or ulcers Psychiatry: Judgement and insight appear normal. Mood & affect appropriate.     Data Reviewed: I have personally reviewed following labs and imaging studies  CBC: Recent Labs  Lab 08/23/19 0348 08/24/19 0908 08/25/19 1740 08/27/19 0526 08/28/19 0324  WBC 9.9 13.5* 20.8* 16.3* 10.7*  NEUTROABS  --   --  19.9* 14.1* 9.3*  HGB 11.5* 12.1* 12.7* 12.9* 12.8*  HCT 36.3* 37.6* 39.2 39.9 40.0  MCV 100.3* 99.2 99.2 101.8* 99.0  PLT 201 266 340 276 440   Basic Metabolic Panel: Recent Labs  Lab 08/22/19 1930 08/23/19 0348 08/24/19 0908 08/24/19 0908 08/25/19 0622 08/26/19 0631 08/27/19 0526 08/28/19 0324 08/29/19 0345  NA 136   < > 137   < > 140 142 143 140 139  K 3.8   < > 3.4*   < > 3.6 3.6 3.9 3.2* 3.2*  CL 100   < > 99   < > 94* 96* 99 101 96*  CO2 21*   < > 25   < > 28 29 30 28 30   GLUCOSE 98   < > 167*   < > 211* 155* 93 135* 163*  BUN 58*   < > 55*   < > 56* 66* 67* 61* 49*  CREATININE 6.79*   < > 6.66*   < > 6.31* 6.89* 6.80* 6.39* 6.12*  CALCIUM 7.5*   < > 8.0*   < > 8.2* 8.2* 7.6* 7.4* 7.8*  MG 1.7  --   --   --   --   --  1.7 1.5* 1.9  PHOS  --    < > 6.2*  --  7.2* 7.3* 7.2* 5.9*  --    < > = values in this interval not displayed.   GFR: Estimated Creatinine Clearance: 14.2 mL/min (A) (by C-G formula based on SCr of 6.12 mg/dL (H)). Liver Function Tests: Recent Labs  Lab 08/25/19 0622 08/26/19 0631 08/27/19 0526 08/28/19 0324 08/29/19 0345  AST  --   --  18 13* 15  ALT  --   --  12 9 9   ALKPHOS  --   --  78 78 81  BILITOT  --   --  1.0 0.6 0.6  PROT  --   --  5.6* 5.7* 6.1*  ALBUMIN 2.2* 2.4* 2.1* 1.9* 2.1*   Recent Labs  Lab 08/25/19 1740  LIPASE 23   No results for input(s): AMMONIA in the last 168 hours. Coagulation  Profile: No results for input(s): INR, PROTIME in the last 168 hours. Cardiac Enzymes: No results for input(s): CKTOTAL, CKMB, CKMBINDEX, TROPONINI in the last 168 hours. BNP (last 3 results) No results for input(s): PROBNP in the last 8760 hours. HbA1C: No results for  input(s): HGBA1C in the last 72 hours. CBG: No results for input(s): GLUCAP in the last 168 hours. Lipid Profile: No results for input(s): CHOL, HDL, LDLCALC, TRIG, CHOLHDL, LDLDIRECT in the last 72 hours. Thyroid Function Tests: No results for input(s): TSH, T4TOTAL, FREET4, T3FREE, THYROIDAB in the last 72 hours. Anemia Panel: No results for input(s): VITAMINB12, FOLATE, FERRITIN, TIBC, IRON, RETICCTPCT in the last 72 hours. Sepsis Labs: No results for input(s): PROCALCITON, LATICACIDVEN in the last 168 hours.  Recent Results (from the past 240 hour(s))  SARS Coronavirus 2 by RT PCR (hospital order, performed in [facility] hospital lab) Nasopharyngeal Nasopharyngeal Swab     Status: None   Collection Time: 08/22/19  8:53 PM   Specimen: Nasopharyngeal Swab  Result Value Ref Range Status   SARS Coronavirus 2 NEGATIVE NEGATIVE Final    Comment: (NOTE) SARS-CoV-2 target nucleic acids are NOT DETECTED. The SARS-CoV-2 RNA is generally detectable in upper and lower respiratory specimens during the acute phase of infection. The lowest concentration of SARS-CoV-2 viral copies this assay can detect is 250 copies / mL. A negative result does not preclude SARS-CoV-2 infection and should not be used as the sole basis for treatment or other patient management decisions.  A negative result may occur with improper specimen collection / handling, submission of specimen other than nasopharyngeal swab, presence of viral mutation(s) within the areas targeted by this assay, and inadequate number of viral copies (<250 copies / mL). A negative result must be combined with clinical observations, patient history, and epidemiological  information. Fact Sheet for Patients:   StrictlyIdeas.no Fact Sheet for Healthcare Providers: BankingDealers.co.za This test is not yet approved or cleared  by the [place] FDA and has been authorized for detection and/or diagnosis of SARS-CoV-2 by FDA under an Emergency Use Authorization (EUA).  This EUA will remain in effect (meaning this test can be used) for the duration of the COVID-19 declaration under Section 564(b)(1) of the Act, 21 U.S.C. section 360bbb-3(b)(1), unless the authorization is terminated or revoked sooner. Performed at [facility], [address], [place], [place] [address]   Culture, blood (routine x 2)     Status: None   Collection Time: 08/22/19  9:34 PM   Specimen: BLOOD RIGHT ARM  Result Value Ref Range Status   Specimen Description BLOOD RIGHT ARM  Final   Special Requests   Final    BOTTLES DRAWN AEROBIC AND ANAEROBIC Blood Culture adequate volume   Culture   Final    NO GROWTH 5 DAYS Performed at [facility], [address], [place], [place] [address]    Report Status 08/27/2019 FINAL  Final  Culture, blood (routine x 2)     Status: None   Collection Time: 08/22/19  9:35 PM   Specimen: BLOOD RIGHT ARM  Result Value Ref Range Status   Specimen Description BLOOD RIGHT ARM  Final   Special Requests   Final    BOTTLES DRAWN AEROBIC AND ANAEROBIC Blood Culture adequate volume   Culture   Final    NO GROWTH 5 DAYS Performed at [facility], [address] [place], [place] [address]    Report Status 08/27/2019 FINAL  Final         Radiology Studies: DG Abd Portable 1V  Result Date: 08/29/2019 CLINICAL DATA:  Small bowel obstruction. EXAM: PORTABLE ABDOMEN - 1 VIEW COMPARISON:  Aug 27, 2019. FINDINGS: Distal tip of nasogastric tube is seen in expected position of distal stomach. Stable small bowel dilatation  is noted concerning for distal small bowel obstruction. IMPRESSION: Distal tip of  nasogastric tube is seen in expected position of distal stomach. Electronically Signed   By: Marijo Conception M.D.   On: 08/29/2019 08:50   DG Abd Portable 1V  Result Date: 08/27/2019 CLINICAL DATA:  Follow-up for small bowel obstruction. EXAM: PORTABLE ABDOMEN - 1 VIEW COMPARISON:  08/26/2019 and CT from 08/25/2019. FINDINGS: There are dilated small bowel loops predominantly in the left abdomen trauma similar to the previous day's study. Nasogastric tube tip projects in the distal stomach. IMPRESSION: 1. Persistent small-bowel dilation consistent with a partial small bowel obstruction, without significant change from the previous day's study. Electronically Signed   By: Lajean Manes M.D.   On: 08/27/2019 13:59        Scheduled Meds: . atorvastatin  80 mg Oral Daily  . bisacodyl  10 mg Rectal Once  . budesonide (PULMICORT) nebulizer solution  0.25 mg Nebulization BID  . calcium acetate  1,334 mg Oral TID WC  . Chlorhexidine Gluconate Cloth  6 each Topical Daily  . gabapentin  300 mg Oral QHS  . gentamicin cream  1 application Topical Daily  . ipratropium-albuterol  3 mL Nebulization BID  . metoprolol tartrate  5 mg Intravenous Q6H  . pantoprazole (PROTONIX) IV  40 mg Intravenous Q12H   Continuous Infusions: .  ceFAZolin (ANCEF) IV    . potassium chloride 10 mEq (08/29/19 0947)     LOS: 7 days    Time spent: 25 minutes    Barb Merino, MD [facility] Pager [phone]

## 2019-08-29 NOTE — Transfer of Care (Signed)
Immediate Anesthesia Transfer of Care Note  Patient: Aaron [name]  Procedure(s) Performed: EXPLORATORY LAPAROTOMY, REPAIR OF SEROSAL INJURY TIMES THREE (N/A Abdomen) Attempted Insertion Central Line Adult (Right Neck) Lysis Of Adhesion (Abdomen)  Patient Location: PACU  Anesthesia Type:General  Level of Consciousness: awake  Airway & Oxygen Therapy: Patient Spontanous Breathing and Patient connected to face mask oxygen  Post-op Assessment: Report given to RN and Post -op Vital signs reviewed and stable  Post vital signs: Reviewed  Last Vitals:  Vitals Value Taken Time  BP    Temp    Pulse    Resp 18 08/29/19 1615  SpO2    Vitals shown include unvalidated device data.  Last Pain:  Vitals:   08/29/19 1111  TempSrc: Oral  PainSc:       Patients Stated Pain Goal: 2 (83/81/84 0375)  Complications: No apparent anesthesia complications

## 2019-08-29 NOTE — Progress Notes (Signed)
Order received for PICC placement.  Message sent to Dr. Jonnie Finner nephrology for approval as patient is renal.

## 2019-08-29 NOTE — Op Note (Signed)
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 year old male with small bowel obstruction, refractory to non-operative management.  Surgeon: Jesusita Oka, MD Assistant: Donald Pore, [place]  Anesthesiologist: Dr. Carlota Raspberry Anesthesia: General  Findings:  . Specimen: mesenteric nodule (ileum) . EBL: <10cc . Drains/Implants: none  Disposition: PACU - hemodynamically stable.  Description of procedure: The patient was positioned supine on the operating room table. General anesthetic induction and intubation were uneventful. Foley catheter insertion was performed and was atraumatic. Time-out was performed verifying correct patient, procedure, signature of informed consent, and administration of pre-operative antibiotics.   The procedure began by prepping the right neck and chest in the usual sterile fashion. The right internal jugular vein was localized with ultrasound guidance and accessed using an introducer needle. A guidewire was passed through the needle, but met repeated resistance distally despite repositioning. This was aborted and attempted at the right subclavian, again meeting repeated resistance distally despite repositioning. This portion of the procedure was then aborted due to the high likelihood of central stenosis.   Next, the abdomen was prepped and draped in the usual sterile fashion. A midline supraumbilical incision was made and deepened through the fascia. The bowel was run from ligament of Treitz to ileocecal valve,  encountering five areas of dense adhesions, the most proximal of which was consistent with the early development of a volvulus without bowel compromise. The adhesions were lysed using a combination of electrocautery and sharp dissection using Metzenbaum scissors. Serosal repair was performed with 3-0 silk suture at three of these areas, with confirmation of luminal patency at each site. A Meckel's diverticulum was identified as well as a small mesenteric nodule at the level of the ileum. The mesenteric nodule was sent to pathology as a specimen. The remainder of the abdomen was inspected and was notable for firm stool through out the colon, a nodular liver, and copious intra-abdominal fluid, the latter not surprising in the setting of peritoneal dialysis. The distal end of the peritoneal catheter was confirmed to be positioned in the pelvis. The midline fascia was closed with #1 looped PDS and the skin closed with staples.  Sterile dressings were applied. All sponge and instrument counts were correct at the conclusion of the procedure. The patient was awakened from anesthesia, extubated uneventfully, and transported to the PACU in good condition. There were no complications.   Clinical update provided to the daughter via phone.   Jesusita Oka, MD General and [facility]

## 2019-08-29 NOTE — Progress Notes (Signed)
Spoke with Dianna, RN regarding patient's PICC, he is in OR at this time.  Will follow up once he returns.  Due to anesthesia family member will be required to sign consent for patient.

## 2019-08-29 NOTE — Progress Notes (Signed)
Physical Therapy Treatment Patient Details Name: Aaron [name] MRN: [id number] DOB: Feb 14, 1956 Today's Date: 08/29/2019    History of Present Illness Pt is a 64 y/o male admitted secondary to worsening SOB thought to be from COPD exacerbation. Pt also with a fib and RVR. PMH includes HTN, COPD, ESRD on PD, CHF, and a fib.     PT Comments    Pt was seen for bed ex's with assistance, and has tolerated with discomfort of abd ongoing.  Pt is expecting to leave shortly for a procedure, and may be a candidate for re-eval upon return.  Follow up to see how he is progressing, as home discharge is still planned.  Monitor pain, increase LE strength and work on endurance of gait.   Follow Up Recommendations  Home health PT     Equipment Recommendations  None recommended by PT    Recommendations for Other Services       Precautions / Restrictions Precautions Precautions: Fall Precaution Comments: NG tube Restrictions Weight Bearing Restrictions: No    Mobility  Bed Mobility               General bed mobility comments: did not reposition in bed  Transfers                 General transfer comment: deferred   Ambulation/Gait                 Stairs             Wheelchair Mobility    Modified Rankin (Stroke Patients Only)       Balance                                            Cognition Arousal/Alertness: Awake/alert Behavior During Therapy: WFL for tasks assessed/performed Overall Cognitive Status: Within Functional Limits for tasks assessed                                        Exercises General Exercises - Lower Extremity Ankle Circles/Pumps: AROM;AAROM;5 reps Quad Sets: AROM;10 reps Gluteal Sets: AROM;10 reps Heel Slides: AROM;AAROM;10 reps Hip ABduction/ADduction: AROM;AAROM;10 reps Straight Leg Raises: AROM;AAROM;10 reps    General Comments General comments (skin integrity, edema, etc.):  monitored discomfort with all mobility, able to exercise with controlled effort and support to LLE      Pertinent Vitals/Pain Pain Assessment: Faces Faces Pain Scale: Hurts even more Pain Location: abdomen  Pain Descriptors / Indicators: Grimacing Pain Intervention(s): Limited activity within patient's tolerance;Monitored during session;Premedicated before session;Repositioned    Home Living                      Prior Function            PT Goals (current goals can now be found in the care plan section) Acute Rehab PT Goals Patient Stated Goal: to get stronger Progress towards PT goals: Progressing toward goals    Frequency    Min 3X/week      PT Plan Current plan remains appropriate    Co-evaluation              AM-PAC PT "6 Clicks" Mobility   Outcome Measure  Help needed turning from your back to your side while in  a flat bed without using bedrails?: None Help needed moving from lying on your back to sitting on the side of a flat bed without using bedrails?: None Help needed moving to and from a bed to a chair (including a wheelchair)?: A Little Help needed standing up from a chair using your arms (e.g., wheelchair or bedside chair)?: A Little Help needed to walk in hospital room?: A Little Help needed climbing 3-5 steps with a railing? : A Lot 6 Click Score: 19    End of Session Equipment Utilized During Treatment: (NG tube) Activity Tolerance: Patient limited by fatigue;Patient limited by pain Patient left: in bed;with call bell/phone within reach Nurse Communication: Mobility status PT Visit Diagnosis: Other abnormalities of gait and mobility (R26.89);Muscle weakness (generalized) (M62.81)     Time: 9037-9558 PT Time Calculation (min) (ACUTE ONLY): 22 min  Charges:  $Therapeutic Exercise: 8-22 mins                    Ramond Dial 08/29/2019, 11:39 AM  Mee Hives, PT MS Acute Rehab Dept. Number: [place] and [place]

## 2019-08-29 NOTE — Plan of Care (Signed)
  Problem: Coping: Goal: Level of anxiety will decrease Outcome: Completed/Met   Problem: Safety: Goal: Ability to remain free from injury will improve Outcome: Completed/Met

## 2019-08-29 NOTE — Progress Notes (Addendum)
[facility] Progress Note     Assessment/ Plan:   1. SOB/COPD/pulmonary edema-  Felt to be multifactorial - COPD/  Afib with rapid rate and volume overload. PD changes to increase ultrafiltration. S/p Steroids/inhalers/azithro.  2. ESRD -secondary to polycystic kidney disease.  Has been on PD for 6 mos. followed in [place].  Albumin is low which can be problematic in PD and UF.  Using 4.25% PD fluid for max UF -> over evning of 5/16 to 5/17 1657 ml net UF.  - Almost certainly will need to convert to HD through [facility] for 1-2 mths after surgery today -> VIR aware.  - Plan on HD tomorrow  Prefer not to have a PICC line but if deemed necessary then only on the left arm as he's had a failed fistula on that side already.  3. Anemia- not an issue right now 4. Secondary hyperparathyroidism - calc OK-  Phos 5.8 (ok for now).  We will continue to monitor 5. HTN/volume-  continue carvedilol and diltiazem for atrial fibrillation. Ultrafiltration as above. 6.  Leukocytosis: Likely associated with steroids.  Fluid studies from peritoneal fluid negative for infection. 7.  Partial small bowel obstruction: N.p.o. except for meds.  Holding Lasix and off of steroids.  Surgery following and appreciate assistance 8.  Hyperdense kidney cyst: Consider further evaluation with MRI in the future. 9.  Hypokalemia: Level 3.2 yest -> replete with  20 mEq of IV as needed. 10.  Hypomagnesemia: Level 1.5.   Subjective:   Feels ok but no  flatus overnight. 1657 UF overnight. Denies f/cn/v/dyspnea.    Objective:   BP 101/86 (BP Location: Right Arm)   Pulse (!) 56   Temp 97.9 F (36.6 C) (Oral)   Resp 12   Ht 6\' 4"  (1.93 m)   Wt 81.5 kg   SpO2 95%   BMI 21.87 kg/m   Intake/Output Summary (Last 24 hours) at 08/29/2019 1236 Last data filed at 08/29/2019 0700 Gross per 24 hour  Intake 15827.76 ml  Output 17102 ml  Net -1274.24 ml   Weight change: -0.556 kg  Physical Exam: General: supine in   bed Heart: bradycardia Lungs: Coarse bilateral breath sounds, no increased work of breathing Abdomen: Distented with +BS Extremities: Trace edema Dialysis Access: PD cath   Imaging: DG Abd Portable 1V  Result Date: 08/29/2019 CLINICAL DATA:  Small bowel obstruction. EXAM: PORTABLE ABDOMEN - 1 VIEW COMPARISON:  Aug 27, 2019. FINDINGS: Distal tip of nasogastric tube is seen in expected position of distal stomach. Stable small bowel dilatation is noted concerning for distal small bowel obstruction. IMPRESSION: Distal tip of nasogastric tube is seen in expected position of distal stomach. Electronically Signed   By: Marijo Conception M.D.   On: 08/29/2019 08:50   [place] EKG SITE RITE  Result Date: 08/29/2019 If Site Rite image not attached, placement could not be confirmed due to current cardiac rhythm.   Labs: BMET Recent Labs  Lab 08/23/19 0348 08/24/19 0908 08/25/19 0622 08/26/19 0631 08/27/19 0526 08/28/19 0324 08/29/19 0345 08/29/19 1103  NA 137 137 140 142 143 140 139  --   K 3.9 3.4* 3.6 3.6 3.9 3.2* 3.2*  --   CL 100 99 94* 96* 99 101 96*  --   CO2 23 25 28 29 30 28 30   --   GLUCOSE 131* 167* 211* 155* 93 135* 163*  --   BUN 57* 55* 56* 66* 67* 61* 49*  --   CREATININE 6.87*  6.66* 6.31* 6.89* 6.80* 6.39* 6.12*  --   CALCIUM 7.3* 8.0* 8.2* 8.2* 7.6* 7.4* 7.8*  --   PHOS 7.1* 6.2* 7.2* 7.3* 7.2* 5.9*  --  5.8*   CBC Recent Labs  Lab 08/24/19 0908 08/25/19 1740 08/27/19 0526 08/28/19 0324  WBC 13.5* 20.8* 16.3* 10.7*  NEUTROABS  --  19.9* 14.1* 9.3*  HGB 12.1* 12.7* 12.9* 12.8*  HCT 37.6* 39.2 39.9 40.0  MCV 99.2 99.2 101.8* 99.0  PLT 266 340 276 258    Medications:    . atorvastatin  80 mg Oral Daily  . bisacodyl  10 mg Rectal Once  . budesonide (PULMICORT) nebulizer solution  0.25 mg Nebulization BID  . calcium acetate  1,334 mg Oral TID WC  . Chlorhexidine Gluconate Cloth  6 each Topical Daily  . gabapentin  300 mg Oral QHS  . gentamicin cream  1  application Topical Daily  . insulin aspart  0-6 Units Subcutaneous Q4H  . ipratropium-albuterol  3 mL Nebulization BID  . metoprolol tartrate  5 mg Intravenous Q6H  . pantoprazole (PROTONIX) IV  40 mg Intravenous Q12H      Otelia Santee, MD 08/29/2019, 12:36 PM

## 2019-08-29 NOTE — Anesthesia Postprocedure Evaluation (Signed)
Anesthesia Post Note  Patient: Aaron [name]  Procedure(s) Performed: EXPLORATORY LAPAROTOMY, REPAIR OF SEROSAL INJURY TIMES THREE (N/A Abdomen) Attempted Insertion Central Line Adult (Right Neck) Lysis Of Adhesion (Abdomen)     Patient location during evaluation: PACU Anesthesia Type: General Level of consciousness: awake and alert, oriented and patient cooperative Pain management: pain level controlled Vital Signs Assessment: post-procedure vital signs reviewed and stable Respiratory status: spontaneous breathing, nonlabored ventilation and respiratory function stable Cardiovascular status: blood pressure returned to baseline and stable Postop Assessment: no apparent nausea or vomiting Anesthetic complications: no    Last Vitals:  Vitals:   08/29/19 1625 08/29/19 1640  BP: 124/80 101/88  Pulse: 92 93  Resp: 17 18  Temp:    SpO2: 99% 97%    Last Pain:  Vitals:   08/29/19 1640  TempSrc:   PainSc: [name]

## 2019-08-29 NOTE — Progress Notes (Addendum)
[facility] Progress Note     Subjective: Nausea overnight, NG tube replaced to ILWS with 1,900 cc/12h. Reports initially having flatus but none in over 24h. No BM since Tuesday 5/11. Frustrated that he is not improving. Confirms a history of PD cath being inserted into his intestines at [place] requiring exploratory laparotomy partial colectomy and PD cath removal end of last year.  Objective: Vital signs in last 24 hours: Temp:  [97.4 F (36.3 C)-98 F (36.7 C)] 97.6 F (36.4 C) (05/17 0736) Pulse Rate:  [55-94] 94 (05/17 0736) Resp:  [16-20] 18 (05/17 0736) BP: (86-172)/(65-136) 98/65 (05/17 0736) SpO2:  [93 %-99 %] 94 % (05/17 0736) Weight:  [81.5 kg] 81.5 kg (05/17 0444) Last BM Date: 08/23/19(just started to eat again )  Intake/Output from previous day: 05/16 0701 - 05/17 0700 In: 15827.8 [P.O.:1200; I.V.:516.8; IV Piggyback:100] Out: 54650 [Urine:300; Emesis/NG output:1900] Intake/Output this shift: No intake/output data recorded.  PE: General: chronically ill appearing white male who is laying in bed in NAD Heart: regular, rate, and rhythm.. No obvious murmurs, gallops, or rubs noted.  Palpable radial and pedal pulses bilaterally Lungs: CTAB.  Respiratory effort nonlabored Abd: soft, protuberant, NT, moderately distended, BS hypoactive, PD catheter palpable, previous laparotomy scar.   Lab Results:  Recent Labs    08/27/19 0526 08/28/19 0324  WBC 16.3* 10.7*  HGB 12.9* 12.8*  HCT 39.9 40.0  PLT 276 258   BMET Recent Labs    08/28/19 0324 08/29/19 0345  NA 140 139  K 3.2* 3.2*  CL 101 96*  CO2 28 30  GLUCOSE 135* 163*  BUN 61* 49*  CREATININE 6.39* 6.12*  CALCIUM 7.4* 7.8*   PT/INR No results for input(s): LABPROT, INR in the last 72 hours. CMP     Component Value Date/Time   NA 139 08/29/2019 0345   K 3.2 (L) 08/29/2019 0345   CL 96 (L) 08/29/2019 0345   CO2 30 08/29/2019 0345   GLUCOSE 163 (H) 08/29/2019 0345   BUN 49 (H)  08/29/2019 0345   CREATININE 6.12 (H) 08/29/2019 0345   CALCIUM 7.8 (L) 08/29/2019 0345   PROT 6.1 (L) 08/29/2019 0345   ALBUMIN 2.1 (L) 08/29/2019 0345   AST 15 08/29/2019 0345   ALT 9 08/29/2019 0345   ALKPHOS 81 08/29/2019 0345   BILITOT 0.6 08/29/2019 0345   GFRNONAA 9 (L) 08/29/2019 0345   GFRAA 10 (L) 08/29/2019 0345   Lipase     Component Value Date/Time   LIPASE 23 08/25/2019 1740       Studies/Results: DG Abd Portable 1V  Result Date: 08/27/2019 CLINICAL DATA:  Follow-up for small bowel obstruction. EXAM: PORTABLE ABDOMEN - 1 VIEW COMPARISON:  08/26/2019 and CT from 08/25/2019. FINDINGS: There are dilated small bowel loops predominantly in the left abdomen trauma similar to the previous day's study. Nasogastric tube tip projects in the distal stomach. IMPRESSION: 1. Persistent small-bowel dilation consistent with a partial small bowel obstruction, without significant change from the previous day's study. Electronically Signed   By: Lajean Manes M.D.   On: 08/27/2019 13:59    Anti-infectives: Anti-infectives (From admission, onward)   Start     Dose/Rate Route Frequency Ordered Stop   08/23/19 2200  azithromycin (ZITHROMAX) tablet 500 mg  Status:  Discontinued    Note to Pharmacy: Pharmacy to adjust timing for next dose.   500 mg Oral Daily at bedtime 08/23/19 0233 08/25/19 2046   08/22/19 2130  azithromycin (ZITHROMAX) 500 mg  in sodium chloride 0.9 % 250 mL IVPB     500 mg 250 mL/hr over 60 Minutes Intravenous  Once 08/22/19 2122 08/22/19 2245       Assessment/Plan CHF COPD A fib Polycystic kidney disease, ESRD, on PD H/O MI CAD Hep C HLD  SBO - SB protocol with GG 5/14, contrast never visualized in the colon - no longer having any flatus, no BM - clamp trial 5/16 +CLD around tube >> nausea overnight and resumed ILWS.  - continue NG tube to ILWS - No significant clinical improvement, patient would be a high risk surgical candidate however I do not  think he is going to improve with non-operative management and suspect he will need exploratory laparotomy early this week. He will also need HD access placed. Will discuss with MD, Dr. Bobbye Morton.  FEN - NGT to ILWS VTE -plavix on hold as of 5/13 ID -none currently   LOS: 7 days    Jill Alexanders , [facility] 08/29/2019, 8:11 AM Please see Amion for pager number during day hours 7:00am-4:30pm

## 2019-08-29 NOTE — Anesthesia Preprocedure Evaluation (Signed)
Anesthesia Evaluation  Patient identified by MRN, date of birth, ID band Patient awake    Reviewed: Allergy & Precautions, NPO status , Patient's Chart, lab work & pertinent test results  Airway Mallampati: II  TM Distance: >3 FB     Dental   Pulmonary Current Smoker,    breath sounds clear to auscultation       Cardiovascular hypertension, + CAD, + Past MI, + Peripheral Vascular Disease and +CHF   Rhythm:Regular Rate:Normal     Neuro/Psych    GI/Hepatic GERD  ,(+) Hepatitis -History noted CG   Endo/Other    Renal/GU Renal disease     Musculoskeletal  (+) Arthritis ,   Abdominal   Peds  Hematology  (+) anemia ,   Anesthesia Other Findings   Reproductive/Obstetrics                             Anesthesia Physical Anesthesia Plan  ASA: III  Anesthesia Plan: General   Post-op Pain Management:    Induction: Intravenous  PONV Risk Score and Plan: 2 and Ondansetron and Dexamethasone  Airway Management Planned:   Additional Equipment:   Intra-op Plan:   Post-operative Plan: Possible Post-op intubation/ventilation  Informed Consent: I have reviewed the patients History and Physical, chart, labs and discussed the procedure including the risks, benefits and alternatives for the proposed anesthesia with the patient or authorized representative who has indicated his/her understanding and acceptance.     Dental advisory given  Plan Discussed with: Anesthesiologist  Anesthesia Plan Comments:         Anesthesia Quick Evaluation

## 2019-08-29 NOTE — Anesthesia Procedure Notes (Signed)
Procedure Name: Intubation Date/Time: 08/29/2019 2:30 PM Performed by: Eulas Post, Lena Gores W, CRNA Pre-anesthesia Checklist: Patient identified, Emergency Drugs available, Suction available and Patient being monitored Patient Re-evaluated:Patient Re-evaluated prior to induction Oxygen Delivery Method: Circle system utilized Preoxygenation: Pre-oxygenation with 100% oxygen Induction Type: IV induction Ventilation: Mask ventilation without difficulty Laryngoscope Size: Miller and 2 Grade View: Grade I Tube type: Oral Tube size: 7.5 mm Number of attempts: 2 Airway Equipment and Method: Stylet Placement Confirmation: ETT inserted through vocal cords under direct vision,  positive ETCO2 and breath sounds checked- equal and bilateral Secured at: 23 cm Tube secured with: Tape Dental Injury: Teeth and Oropharynx as per pre-operative assessment

## 2019-08-29 NOTE — Progress Notes (Signed)
Patient to the OR. Tele on standby at bedside. Will await any updates per OR. Daughters at bedside number confirmed in chart

## 2019-08-30 ENCOUNTER — Inpatient Hospital Stay (HOSPITAL_COMMUNITY): Payer: 59

## 2019-08-30 HISTORY — PX: IR US GUIDE VASC ACCESS RIGHT: IMG2390

## 2019-08-30 HISTORY — PX: IR FLUORO GUIDE CV LINE RIGHT: IMG2283

## 2019-08-30 LAB — CBC
HCT: 37.3 % — ABNORMAL LOW (ref 39.0–52.0)
HCT: 38.3 % — ABNORMAL LOW (ref 39.0–52.0)
Hemoglobin: 11.7 g/dL — ABNORMAL LOW (ref 13.0–17.0)
Hemoglobin: 12.3 g/dL — ABNORMAL LOW (ref 13.0–17.0)
MCH: 33.1 pg (ref 26.0–34.0)
MCH: 32 pg (ref 26.0–34.0)
MCHC: 31.4 g/dL (ref 30.0–36.0)
MCHC: 32.1 g/dL (ref 30.0–36.0)
MCV: 105.7 fL — ABNORMAL HIGH (ref 80.0–100.0)
MCV: 99.7 fL (ref 80.0–100.0)
Platelets: 242 10*3/uL (ref 150–400)
Platelets: 257 10*3/uL (ref 150–400)
RBC: 3.53 MIL/uL — ABNORMAL LOW (ref 4.22–5.81)
RBC: 3.84 MIL/uL — ABNORMAL LOW (ref 4.22–5.81)
RDW: 14.6 % (ref 11.5–15.5)
RDW: 14.1 % (ref 11.5–15.5)
WBC: 12.6 10*3/uL — ABNORMAL HIGH (ref 4.0–10.5)
WBC: 12.9 10*3/uL — ABNORMAL HIGH (ref 4.0–10.5)
nRBC: 0 % (ref 0.0–0.2)
nRBC: 0 % (ref 0.0–0.2)

## 2019-08-30 LAB — GLUCOSE, CAPILLARY
Glucose-Capillary: 119 mg/dL — ABNORMAL HIGH (ref 70–99)
Glucose-Capillary: 131 mg/dL — ABNORMAL HIGH (ref 70–99)
Glucose-Capillary: 140 mg/dL — ABNORMAL HIGH (ref 70–99)
Glucose-Capillary: 146 mg/dL — ABNORMAL HIGH (ref 70–99)

## 2019-08-30 LAB — COMPREHENSIVE METABOLIC PANEL
ALT: <5 U/L (ref 0–44)
AST: 12 U/L — ABNORMAL LOW (ref 15–41)
Albumin: 2.2 g/dL — ABNORMAL LOW (ref 3.5–5.0)
Alkaline Phosphatase: 65 U/L (ref 38–126)
Anion gap: 14 (ref 5–15)
BUN: 55 mg/dL — ABNORMAL HIGH (ref 8–23)
CO2: 26 mmol/L (ref 22–32)
Calcium: 7.7 mg/dL — ABNORMAL LOW (ref 8.9–10.3)
Chloride: 98 mmol/L (ref 98–111)
Creatinine, Ser: 6.84 mg/dL — ABNORMAL HIGH (ref 0.61–1.24)
GFR calc Af Amer: 9 mL/min — ABNORMAL LOW (ref >60)
GFR calc non Af Amer: 8 mL/min — ABNORMAL LOW (ref >60)
Glucose, Bld: 114 mg/dL — ABNORMAL HIGH (ref 70–99)
Potassium: 3.8 mmol/L (ref 3.5–5.1)
Sodium: 138 mmol/L (ref 135–145)
Total Bilirubin: 0.7 mg/dL (ref 0.3–1.2)
Total Protein: 5.5 g/dL — ABNORMAL LOW (ref 6.5–8.1)

## 2019-08-30 LAB — RENAL FUNCTION PANEL
Albumin: 2 g/dL — ABNORMAL LOW (ref 3.5–5.0)
Anion gap: 13 (ref 5–15)
BUN: 55 mg/dL — ABNORMAL HIGH (ref 8–23)
CO2: 26 mmol/L (ref 22–32)
Calcium: 7.5 mg/dL — ABNORMAL LOW (ref 8.9–10.3)
Chloride: 99 mmol/L (ref 98–111)
Creatinine, Ser: 6.77 mg/dL — ABNORMAL HIGH (ref 0.61–1.24)
GFR calc Af Amer: 9 mL/min — ABNORMAL LOW (ref >60)
GFR calc non Af Amer: 8 mL/min — ABNORMAL LOW (ref >60)
Glucose, Bld: 123 mg/dL — ABNORMAL HIGH (ref 70–99)
Phosphorus: 6.3 mg/dL — ABNORMAL HIGH (ref 2.5–4.6)
Potassium: 3.7 mmol/L (ref 3.5–5.1)
Sodium: 138 mmol/L (ref 135–145)

## 2019-08-30 LAB — PREALBUMIN
Prealbumin: 19 mg/dL (ref 18–38)
Prealbumin: 22.5 mg/dL (ref 18–38)

## 2019-08-30 LAB — TRIGLYCERIDES: Triglycerides: 63 mg/dL (ref <150)

## 2019-08-30 LAB — HEPATITIS B SURFACE ANTIGEN: Hepatitis B Surface Ag: NONREACTIVE

## 2019-08-30 LAB — MAGNESIUM: Magnesium: 1.9 mg/dL (ref 1.7–2.4)

## 2019-08-30 LAB — PHOSPHORUS: Phosphorus: 6.5 mg/dL — ABNORMAL HIGH (ref 2.5–4.6)

## 2019-08-30 MED ORDER — HEPARIN SODIUM (PORCINE) 1000 UNIT/ML IJ SOLN
INTRAMUSCULAR | Status: AC | PRN
  Administered 2019-08-30: 2800 [IU] via INTRAVENOUS

## 2019-08-30 MED ORDER — HEPARIN SODIUM (PORCINE) 1000 UNIT/ML IJ SOLN
INTRAMUSCULAR | Status: AC
  Filled 2019-08-30: qty 1

## 2019-08-30 MED ORDER — LIDOCAINE-PRILOCAINE 2.5-2.5 % EX CREA: 1.0000 "application " | TOPICAL_CREAM | CUTANEOUS | Status: DC | PRN

## 2019-08-30 MED ORDER — MORPHINE SULFATE (PF) 4 MG/ML IV SOLN
INTRAVENOUS | Status: AC
  Administered 2019-08-30: 4 mg
  Filled 2019-08-30: qty 1

## 2019-08-30 MED ORDER — PENTAFLUOROPROP-TETRAFLUOROETH EX AERO: 1.0000 "application " | INHALATION_SPRAY | CUTANEOUS | Status: DC | PRN

## 2019-08-30 MED ORDER — SODIUM CHLORIDE 0.9 % IV SOLN: 100.0000 mL | INTRAVENOUS | Status: DC | PRN

## 2019-08-30 MED ORDER — IOHEXOL 300 MG/ML  SOLN
50.0000 mL | Freq: Once | INTRAMUSCULAR | Status: AC | PRN
  Administered 2019-08-30: 10 mL via INTRAVENOUS

## 2019-08-30 MED ORDER — LIDOCAINE HCL 1 % IJ SOLN
INTRAMUSCULAR | Status: AC
  Filled 2019-08-30: qty 20

## 2019-08-30 MED ORDER — TRACE MINERALS CU-MN-SE-ZN 300-55-60-3000 MCG/ML IV SOLN
INTRAVENOUS | Status: AC
  Filled 2019-08-30: qty 360

## 2019-08-30 MED ORDER — LIDOCAINE HCL (PF) 1 % IJ SOLN: 5.0000 mL | INTRAMUSCULAR | Status: DC | PRN

## 2019-08-30 MED ORDER — LIDOCAINE HCL 1 % IJ SOLN
INTRAMUSCULAR | Status: AC | PRN
  Administered 2019-08-30: 10 mL

## 2019-08-30 MED ORDER — HEPARIN SODIUM (PORCINE) 1000 UNIT/ML DIALYSIS: 1000.0000 [IU] | INTRAMUSCULAR | Status: DC | PRN

## 2019-08-30 MED ORDER — ALTEPLASE 2 MG IJ SOLR: 2.0000 mg | Freq: Once | INTRAMUSCULAR | Status: DC | PRN

## 2019-08-30 NOTE — Progress Notes (Signed)
Patient requests to be completely under anesthesia for the tunneled catheter procedure. Navigator suggested that he speak with the surgeon about these wishes prior to the procedure.  Aaron [name],  Renal Navigator [phone]

## 2019-08-30 NOTE — Progress Notes (Signed)
[facility] Progress Note     Assessment/ Plan:   1. SOB/COPD/pulmonary edema-Felt to be multifactorial - COPD/ Afib with rapid rate and volume overload. PD changes to increase ultrafiltration. S/p Steroids/inhalers/azithro.  2. ESRD-secondary to polycystic kidney disease. Has been on PD for 6 mos. followed in [place]. Albumin is low which can be problematic in PD and UF. Used 4.25% PD fluid for max UF -> over evning of 5/16 to 5/17 1657 ml net UF.  - Will need to convert to HD through [facility] for 1-2 mths after surgery 5/17  -> VIR order placed by surgery.   - Plan on HD today after catheter is placed; Renal navigator aware of pt and won't be an issue to place back with [facility].  Lt sided  PICC line in place  (5/17) for TPN.  3. Anemia- not an issue right now 4. Secondary hyperparathyroidism - calc OK- Phos 5.8 (ok for now). We will continue to monitor; will start binder once he's eating. 5. HTN/volume-continue carvedilol and diltiazem for atrial fibrillation. Ultrafiltration as above. 6. Leukocytosis:Likely associated with steroids. Fluid studies from peritoneal fluid negative for infection. 7. Partial small bowel obstruction: N.p.o. except for meds. Holding Lasix and off of steroids. Surgery following and appreciate assistance; dense adhesions on 5/17. 8. Hyperdense kidney cyst: Consider further evaluation with MRI in the future. 9.Hypokalemia: replete as needed.   Subjective:   Feels ok and +  overnight.   Denies f/cn/v/dyspnea; +appetite.   Objective:   BP 114/86 (BP Location: Right Arm)   Pulse 98   Temp 98.6 F (37 C) (Oral)   Resp 18   Ht 6\' 4"  (1.93 m)   Wt 81.5 kg   SpO2 96%   BMI 21.87 kg/m   Intake/Output Summary (Last 24 hours) at 08/30/2019 5784 Last data filed at 08/30/2019 0300 Gross per 24 hour  Intake 2048.28 ml  Output 250 ml  Net 1798.28 ml   Weight change: 0 kg  Physical Exam: General: supine in  bed Heart:  bradycardia Lungs:Coarse bilateral breath sounds, no increased work of breathing Abdomen: Distented with +BS Extremities: Trace edema Dialysis Access: PD cath Access: Lt arm PICC  Imaging: DG CHEST PORT 1 VIEW  Result Date: 08/29/2019 CLINICAL DATA:  PICC placement EXAM: PORTABLE CHEST 1 VIEW COMPARISON:  Radiograph 08/29/2019 FINDINGS: Transesophageal tube tip and side port below the GE junction, beyond the margins of imaging. A left upper extremity PICC tip terminates at the superior cavoatrial junction. Telemetry leads overlie the chest. Redemonstration of the patchy right basilar opacity with a background of more chronic coarsened interstitial change in apical lucency likely reflecting emphysematous features prior cross-sectional imaging. No convincing features of edema. No pneumothorax or visible effusion of the left costophrenic sulcus is partially collimated. The aorta is calcified. The remaining cardiomediastinal contours are unremarkable. No acute osseous or soft tissue abnormality. Degenerative changes are present in the imaged spine and shoulders. IMPRESSION: 1. Left upper extremity PICC tip terminates at the superior cavoatrial junction. 2. Transesophageal tube tip and side port below the GE junction, beyond the margins of imaging. 3. Unchanged patchy right basilar opacity. 4. Coarsened interstitial changes and chronic apical lucency compatible with patient's known COPD. Electronically Signed   By: Lovena Le M.D.   On: 08/29/2019 23:02   DG Chest Port 1 View  Result Date: 08/29/2019 CLINICAL DATA:  64 year old male with respiratory failure. EXAM: PORTABLE CHEST 1 VIEW COMPARISON:  Portable chest 08/25/2019 and earlier. FINDINGS: Portable AP semi upright  view at 1723 hours. Enteric tube placed, courses to the left upper quadrant with tip not included. No endotracheal tube identified. Mediastinal contours are stable and within normal limits. Evidence of upper lung emphysema on a prior  cervical spine CT 01/31/2018, and continued attenuation of upper lobe bronchovascular markings. Minimal patchy right lung base opacity not significantly changed since 08/22/2019. No pneumothorax, pulmonary edema or pleural effusion identified. IMPRESSION: 1. Enteric tube placed into the stomach with tip not included. 2. Emphysema with minimal right lung base opacity more resembling atelectasis than infection. Electronically Signed   By: Genevie Ann M.D.   On: 08/29/2019 17:34   DG Abd Portable 1V  Result Date: 08/29/2019 CLINICAL DATA:  Small bowel obstruction. EXAM: PORTABLE ABDOMEN - 1 VIEW COMPARISON:  Aug 27, 2019. FINDINGS: Distal tip of nasogastric tube is seen in expected position of distal stomach. Stable small bowel dilatation is noted concerning for distal small bowel obstruction. IMPRESSION: Distal tip of nasogastric tube is seen in expected position of distal stomach. Electronically Signed   By: Marijo Conception M.D.   On: 08/29/2019 08:50   [place] EKG SITE RITE  Result Date: 08/29/2019 If Site Rite image not attached, placement could not be confirmed due to current cardiac rhythm.   Labs: BMET Recent Labs  Lab 08/24/19 0908 08/25/19 0622 08/26/19 0631 08/27/19 0526 08/28/19 0324 08/29/19 0345 08/29/19 1103  NA 137 140 142 143 140 139  --   K 3.4* 3.6 3.6 3.9 3.2* 3.2*  --   CL 99 94* 96* 99 101 96*  --   CO2 25 28 29 30 28 30   --   GLUCOSE 167* 211* 155* 93 135* 163*  --   BUN 55* 56* 66* 67* 61* 49*  --   CREATININE 6.66* 6.31* 6.89* 6.80* 6.39* 6.12*  --   CALCIUM 8.0* 8.2* 8.2* 7.6* 7.4* 7.8*  --   PHOS 6.2* 7.2* 7.3* 7.2* 5.9*  --  5.8*   CBC Recent Labs  Lab 08/25/19 1740 08/27/19 0526 08/28/19 0324 08/30/19 0500  WBC 20.8* 16.3* 10.7* 12.6*  NEUTROABS 19.9* 14.1* 9.3*  --   HGB 12.7* 12.9* 12.8* 11.7*  HCT 39.2 39.9 40.0 37.3*  MCV 99.2 101.8* 99.0 105.7*  PLT 340 276 258 242    Medications:    . atorvastatin  80 mg Oral Daily  . budesonide (PULMICORT)  nebulizer solution  0.25 mg Nebulization BID  . calcium acetate  1,334 mg Oral TID WC  . Chlorhexidine Gluconate Cloth  6 each Topical Daily  . gabapentin  300 mg Oral QHS  . gentamicin cream  1 application Topical Daily  . insulin aspart  0-6 Units Subcutaneous Q4H  . ipratropium-albuterol  3 mL Nebulization BID  . metoprolol tartrate  5 mg Intravenous Q6H  . pantoprazole (PROTONIX) IV  40 mg Intravenous Q12H  . sodium chloride flush  10-40 mL Intracatheter Q12H      Otelia Santee, MD 08/30/2019, 7:22 AM

## 2019-08-30 NOTE — Plan of Care (Signed)
  Problem: Pain Managment: Goal: General experience of comfort will improve Outcome: Progressing   Problem: Cardiac: Goal: Ability to achieve and maintain adequate cardiopulmonary perfusion will improve Outcome: Progressing   Problem: Nutrition: Goal: Adequate nutrition will be maintained Outcome: Not Progressing Note: Patient requiring TPN for nutrition supply. Will continue to monitor for progress of bowel function   Problem: Elimination: Goal: Will not experience complications related to bowel motility Outcome: Not Progressing Note: Patient still not able to have a BM and has not been passing gas since surgery

## 2019-08-30 NOTE — Progress Notes (Signed)
HD tx resumed

## 2019-08-30 NOTE — Consult Note (Signed)
Chief Complaint: ESRD unable to use PD catheter s/p abdominal surgery  Referring Physician(s): Dr. Augustin Coupe  Supervising Physician: Corrie Mckusick  Patient Status: [facility] - In-pt  History of Present Illness: Aaron [name] is a 64 y.o. male History of ESRD on PD, a fib ( on eliquis) presented to this facility with [facility] found to be in a fib RVR with and SBO s/p exploratory lap on 5.17.21. IR placed a temporary catheter on 5.18.21 (high RIJ). Team is requesting conversion from temp cath to tunneled catheter for outpatient dialysis access. Team states that the current temp cath is not functional/ at this time.  Past Medical History:  Diagnosis Date  . Anxiety   . Arthritis    knees , back , shoulders (10/12/2017)  . Atrial fibrillation ([place])   . CAD (coronary artery disease)    Mild nonobstructive disease at cardiac catheterization 2002  . Chronic back pain    "back of my neck; down thru my legs" (10/12/2017)  . COPD (chronic obstructive pulmonary disease) ([place])   . Diverticulitis   . Esophageal reflux   . ESRD (end stage renal disease) on dialysis [facility])    "TTS; [place]" (11/23/2017)  . Essential hypertension   . Hepatitis C    states he no longer has this  . History of kidney stones   . History of syncope   . Hyperlipidemia   . Jerking 09/23/2014  . Myocardial infarction ([place]) 02/2017   "light one" (10/12/2017)  . Non-compliant behavior    non compliant with diaylsis per daughter  . PAT (paroxysmal atrial tachycardia) ([place])   . Peripheral arterial disease ([place])    Occluded left superficial femoral artery status post stent June 2016 - Dr. Trula Slade  . Pneumonia 1961  . Polycystic kidney, unspecified type   . Syncope 09/2014  . SYNCOPE 05/07/2010   Qualifier: Diagnosis of  By: Laurance Flatten RN, BSN, Anderson Malta      Past Surgical History:  Procedure Laterality Date  . ABDOMINAL AORTOGRAM N/A 08/11/2017   Procedure: ABDOMINAL AORTOGRAM;  Surgeon: Serafina Mitchell, MD;  Location: [place] [facility]  LAB;  Service: Cardiovascular;  Laterality: N/A;  . ANTERIOR CERVICAL DECOMP/DISCECTOMY FUSION    . APPLICATION OF WOUND VAC Right 08/14/2017   Procedure: APPLICATION OF PREVENA INCISIONAL WOUND VAC RIGHT GROIN;  Surgeon: Serafina Mitchell, MD;  [facility];  Service: Vascular;  Laterality: Right;  . AV FISTULA PLACEMENT Left 09/04/2017   Procedure: ARTERIOVENOUS (AV) FISTULA CREATION LEFT UPPER ARM;  Surgeon: Serafina Mitchell, MD;  Location: [place];  Service: Vascular;  Laterality: Left;  . BACK SURGERY    . BASCILIC VEIN TRANSPOSITION Left 11/20/2017   Procedure: SECOND STAGE BASILIC VEIN TRANSPOSITION LEFT ARM;  Surgeon: Serafina Mitchell, MD;  Location: [place];  Service: Vascular;  Laterality: Left;  . BIOPSY  12/17/2016   Procedure: BIOPSY;  Surgeon: Daneil Dolin, MD;  [facility];  Service: Gastroenterology;;  gastric colon  . BIOPSY  04/29/2017   Procedure: BIOPSY;  Surgeon: Daneil Dolin, MD;  [facility];  Service: Endoscopy;;  duodenal biopsies  . BUNIONECTOMY Bilateral   . CENTRAL VENOUS CATHETER INSERTION Right 08/29/2019   Procedure: Attempted Insertion Central Line Adult;  Surgeon: Jesusita Oka, MD;  Location: [place];  Service: General;  Laterality: Right;  . COLONOSCOPY  2008   Dr. Oneida Alar: rare sigmoid colon diverticulosis, internal hemorrhoids.   . COLONOSCOPY WITH PROPOFOL N/A 12/17/2016   dense left-sided  diverticulosis, right colon ulcers s/p biopsy query occult NSAID use vs transient ischemia, not consistent with IBD. CMV stains negative.   Marland Kitchen ENDARTERECTOMY FEMORAL Right 08/14/2017   Procedure: RIGHT ILLIO-FEMORAL ENDARTERECTOMY;  Surgeon: Serafina Mitchell, MD;  Location: [facility] OR;  Service: Vascular;  Laterality: Right;  . ESOPHAGEAL DILATION  12/17/2016   EGD with mild Schatzki's ring s/p dilatation, small hiatal hernia, erosive gastropathy (negative H.pylori gastritis)  . ESOPHAGOGASTRODUODENOSCOPY  2008   Dr. Oneida Alar: normal esophagus without Barrett's,  antritis and duodenitis, path with H.pylori gastritis  . ESOPHAGOGASTRODUODENOSCOPY (EGD) WITH PROPOFOL N/A 12/17/2016   Procedure: ESOPHAGOGASTRODUODENOSCOPY (EGD) WITH PROPOFOL;  Surgeon: Daneil Dolin, MD;  [facility];  Service: Gastroenterology;  Laterality: N/A;  . ESOPHAGOGASTRODUODENOSCOPY (EGD) WITH PROPOFOL N/A 04/29/2017   Patchy erythema of gastric mucosa diffusely, extensive inflammatory changes in duodenum, geographic ulceration and mucosal edema present, encroaching somewhat on the lumen yet still widely patent, distal second portion of duodenum appeared abnormal, path with peptic duodenitis with ulceration  . GROIN DEBRIDEMENT Right 10/14/2017   Procedure: RIGHT GROIN AND RIGHT LOWER QUADRANT ABDOMEN DEBRIDEMENT WITH PLACEMENT OF ANTIBIOTIC BEADS;  Surgeon: Serafina Mitchell, MD;  Location: [place];  Service: Vascular;  Laterality: Right;  . HEMATOMA EVACUATION Left 12/06/2017   Procedure: EVACUATION HEMATOMA;  Surgeon: Waynetta Sandy, MD;  Location: [place];  Service: Vascular;  Laterality: Left;  . HERNIA REPAIR  2878   umbilical  . I & D EXTREMITY Right 09/21/2017   Procedure: IRRIGATION AND DEBRIDEMENT GROIN;  Surgeon: Elam Dutch, MD;  Location: [place];  Service: Vascular;  Laterality: Right;  . I & D EXTREMITY Left 11/23/2017   Procedure: Evacuation Hematoma LEFT UPPER ARM GRAFT;  Surgeon: Elam Dutch, MD;  Location: [place];  Service: Vascular;  Laterality: Left;  . INGUINAL HERNIA REPAIR Right 02/2017   [place]  . INSERTION OF DIALYSIS CATHETER Right 09/04/2017   Procedure: INSERTION OF TUNNELED  DIALYSIS CATHETER - RIGHT INTERNAL JUGULAR PLACEMENT;  Surgeon: Serafina Mitchell, MD;  Location: [place];  Service: Vascular;  Laterality: Right;  . INSERTION OF DIALYSIS CATHETER Right 07/26/2018   Procedure: INSERTION OF DIALYSIS CATHETER RIGHT INTERNAL JUGULAR;  Surgeon: Marty Heck, MD;  Location: [place];  Service: Vascular;  Laterality: Right;  .  INSERTION OF ILIAC STENT Right 08/14/2017   Procedure: INSERTION OF RIGHT COMMON ILIAC STENT 1m x 871mx 130cm INSERTION OF RIGHT EXTERNAL ILIAC STENT 61m83m 70m65m130cm INSERTION OF SUPERFICIAL FERMORAL ARTERY STENT 7mm 61m0mm 57m0cm;  Surgeon: BrabhaSerafina Mitchell [facility];  Laterality: Right;  . IR FLUORO GUIDE CV LINE RIGHT  08/31/2017  . IR FLUORO GUIDE CV LINE RIGHT  08/30/2019  . IR REMOVAL TUN CV CATH W/O FL  05/28/2018  . IR REMOVAL TUN CV CATH W/O FL  03/07/2019  . IR US [place] VASC ACCESS RIGHT  08/31/2017  . IR US [place] VASC ACCESS RIGHT  08/30/2019  . LAPAROTOMY N/A 08/29/2019   Procedure: EXPLORATORY LAPAROTOMY, REPAIR OF SEROSAL INJURY TIMES THREE;  Surgeon: LovickJesusita Oka [facility];  Laterality: N/A;  . LOWER EXTREMITY ANGIOGRAPHY Right 08/11/2017   Procedure: LOWER EXTREMITY ANGIOGRAPHY;  Surgeon: BrabhaSerafina Mitchell [facility];  Service: Cardiovascular;  Laterality: Right;  . LYSIS OF ADHESION  08/29/2019   Procedure: Lysis Of Adhesion;  Surgeon: LovickJesusita Oka  Location: [place] OR;  Service: General;;  . PATCH ANGIOPLASTY Right 08/14/2017   Procedure: PATCH ANGIOPLASTY USING HEMASHIELD PATCH 0.3IN Lillie Columbia;  Surgeon: Serafina Mitchell, MD;  Location: [place];  Service: Vascular;  Laterality: Right;  . PERIPHERAL VASCULAR CATHETERIZATION N/A 09/20/2014   Procedure: Abdominal Aortogram;  Surgeon: Serafina Mitchell, MD;  Location: [place] [facility] LAB;  Service: Cardiovascular;  Laterality: N/A;  . POSTERIOR FUSION LUMBAR SPINE    . TEE WITHOUT CARDIOVERSION N/A 01/12/2018   Procedure: TRANSESOPHAGEAL ECHOCARDIOGRAM (TEE);  Surgeon: Lelon Perla, MD;  Location: [facility] ENDOSCOPY;  Service: Cardiovascular;  Laterality: N/A;    Allergies: Patient has no known allergies.  Medications: Prior to Admission medications   Medication Sig Start Date End Date Taking? Authorizing Provider  albuterol (PROVENTIL HFA;VENTOLIN  HFA) 108 (90 Base) MCG/ACT inhaler Inhale 2 puffs into the lungs every 6 (six) hours as needed for wheezing or shortness of breath.   Yes [provider]  albuterol (PROVENTIL) (2.5 MG/3ML) 0.083% nebulizer solution Inhale 3 mLs into the lungs 3 (three) times daily as needed for shortness of breath or wheezing. 06/23/18  Yes [provider]  BREO ELLIPTA 200-25 MCG/INH AEPB Inhale 1 puff into the lungs daily as needed (for respiratory issues.).  07/27/17  Yes [provider]  carvedilol (COREG) 25 MG tablet Take 25 mg by mouth 2 (two) times daily. 04/25/19  Yes [provider]  diltiazem (CARDIZEM) 60 MG tablet Take 1 tablet (60 mg total) by mouth every 6 (six) hours. 05/28/19  Yes Vann, Jessica U, DO  gabapentin (NEURONTIN) 300 MG capsule Take 300 mg by mouth 2 (two) times daily. 07/07/18  Yes [provider]  hydrALAZINE (APRESOLINE) 25 MG tablet Take 1 tablet (25 mg total) by mouth every 8 (eight) hours. 05/28/19  Yes Geradine Girt, DO  oxyCODONE-acetaminophen (PERCOCET) 10-325 MG tablet Take 1 tablet by mouth 5 (five) times daily as needed for pain.    Yes [provider]  pantoprazole (PROTONIX) 40 MG tablet Take 1 tablet (40 mg total) by mouth 2 (two) times daily. Patient taking differently: Take 40 mg by mouth daily.  04/30/17 01/27/26 Yes Shah, Pratik D, DO  varenicline (CHANTIX) 0.5 MG tablet Take 0.5 mg by mouth 2 (two) times daily.   Yes [provider]  apixaban (ELIQUIS) 2.5 MG TABS tablet Take 2.5 mg by mouth 2 (two) times daily.    [provider]  atorvastatin (LIPITOR) 80 MG tablet Take 80 mg by mouth daily. 04/25/19   [provider]  calcium acetate (PHOSLO) 667 MG capsule Take 667 mg by mouth 3 (three) times daily with meals. 06/29/18   [provider]  clopidogrel (PLAVIX) 75 MG tablet Take 75 mg by mouth daily. 06/28/18   [provider]  furosemide (LASIX) 40 MG tablet Take 40 mg by mouth  daily.    [provider]  isosorbide mononitrate (IMDUR) 60 MG 24 hr tablet Take 60 mg by mouth daily. 07/07/18   [provider]  lisinopril (ZESTRIL) 20 MG tablet Take 20 mg by mouth daily.    [provider]  sodium bicarbonate 650 MG tablet Take 1 tablet (650 mg total) by mouth daily. Patient not taking: Reported on 08/23/2019 04/04/18   Orson Eva, MD     Family History  Problem Relation Age of Onset  . Alcoholism Mother   . Heart disease Father        Massive heart attack  . Heart  attack Father   . Atrial fibrillation Father   . Colon cancer Father   . Colon cancer Maternal Grandfather 87  . Alcoholism Maternal Grandfather   . Renal cancer Cousin   . Ovarian cancer Sister     Social History   Socioeconomic History  . Marital status: Legally Separated    Spouse name: Not on file  . Number of children: 2  . Years of education: Not on file  . Highest education level: Not on file  Occupational History  . Occupation: DISABLED  Tobacco Use  . Smoking status: Current Every Day Smoker    Packs/day: 0.25    Years: 47.00    Pack years: 11.75    Types: Cigarettes    Last attempt to quit: 08/03/2017    Years since quitting: 2.0  . Smokeless tobacco: Never Used  Substance and Sexual Activity  . Alcohol use: Not Currently    Comment: 10/12/2017 alcohol free since 2017,  heavy drinker in the past  . Drug use: Not Currently    Comment: "<2003 whatever was around; nothing since"  . Sexual activity: Not Currently  Other Topics Concern  . Not on file  Social History Narrative   Disabled from Back since 2007   Social Determinants of Health   Financial Resource Strain:   . Difficulty of Paying Living Expenses:   Food Insecurity:   . Worried About Charity fundraiser in the Last Year:   . Arboriculturist in the Last Year:   Transportation Needs:   . Film/video editor (Medical):   Marland Kitchen Lack of Transportation (Non-Medical):   Physical Activity:   .  Days of Exercise per Week:   . Minutes of Exercise per Session:   Stress:   . Feeling of Stress :   Social Connections:   . Frequency of Communication with Friends and Family:   . Frequency of Social Gatherings with Friends and Family:   . Attends Religious Services:   . Active Member of Clubs or Organizations:   . Attends Archivist Meetings:   Marland Kitchen Marital Status:     Review of Systems: A 12 point ROS discussed and pertinent positives are indicated in the HPI above.  All other systems are negative.  Review of Systems  Constitutional: Negative for fever.  HENT: Negative for congestion.        Neck pain around vas cath site  Respiratory: Negative for cough and shortness of breath.   Cardiovascular: Negative for chest pain.  Gastrointestinal: Positive for abdominal pain ( improved but tender).  Neurological: Negative for headaches.  Psychiatric/Behavioral: Negative for behavioral problems and confusion.    Vital Signs: BP (P) 112/68   Pulse (P) 63   Temp 97.7 F (36.5 C) (Oral)   Resp 18   Ht _0  (1.93 m)   Wt 173 lb 11.6 oz (78.8 kg)   SpO2 93%   BMI 21.15 kg/m   Physical Exam Vitals and nursing note reviewed.  Constitutional:      Appearance: He is well-developed.  HENT:     Head: Normocephalic.  Cardiovascular:     Rate and Rhythm: Normal rate and regular rhythm.     Comments: RIJ vas cath present Pulmonary:     Effort: Pulmonary effort is normal.     Breath sounds: Examination of the right-upper field reveals decreased breath sounds. Examination of the left-upper field reveals decreased breath sounds. Decreased breath sounds present.  Abdominal:  Comments: NG tube present  Musculoskeletal:        General: Normal range of motion.     Cervical back: Normal range of motion.  Skin:    General: Skin is dry.  Neurological:     Mental Status: He is alert and oriented to person, place, and time.     Imaging: DG Abd 1 View  Result Date:  08/26/2019 CLINICAL DATA:  Small bowel obstruction. EXAM: ABDOMEN - 1 VIEW COMPARISON:  CT abdomen pelvis from yesterday. FINDINGS: Enteric tube in the stomach. Peritoneal dialysis catheter in the pelvis. Multiple dilated loops of small bowel measuring up to 5.0 cm in diameter, progressed from yesterday's CT. No radio-opaque calculi or other significant radiographic abnormality are seen. No acute osseous abnormality. Right iliac artery stents. IMPRESSION: 1. Mildly worsened small bowel obstruction. Electronically Signed   By: Titus Dubin M.D.   On: 08/26/2019 12:01   CT ABDOMEN PELVIS W CONTRAST  Result Date: 08/25/2019 CLINICAL DATA:  Nausea, vomiting. EXAM: CT ABDOMEN AND PELVIS WITH CONTRAST TECHNIQUE: Multidetector CT imaging of the abdomen and pelvis was performed using the standard protocol following bolus administration of intravenous contrast. CONTRAST:  135m OMNIPAQUE IOHEXOL 300 MG/ML  SOLN COMPARISON:  November 27, 2018. FINDINGS: Lower chest: Small left pleural effusion is noted with adjacent subsegmental atelectasis. Hepatobiliary: No gallstones or biliary dilatation is noted. Multiple cysts are again noted in the patent parenchyma. Pancreas: Unremarkable. No pancreatic ductal dilatation or surrounding inflammatory changes. Spleen: Normal in size without focal abnormality. Adrenals/Urinary Tract: Adrenal glands appear normal. Continued presence of findings consistent with severe polycystic kidney disease. No hydronephrosis or renal obstruction is noted. Urinary bladder is unremarkable. 5.4 x 5.1 cm hyperdense abnormality is seen involving the anterior aspect of the superior pole of the left kidney which is enlarged compared to prior exam. This may represent enlarging hyperdense cyst, but possible neoplasm cannot be excluded. Further evaluation with MRI or ultrasound is recommended. Stomach/Bowel: Mild gastric distention is noted. No colonic obstruction is noted. The appendix is not visualized.  Proximal small bowel dilatation is noted with possible transition zone seen in the left upper quadrant best seen on image number 41 of series 3. More distal small bowel is not dilated. This is concerning for possible small bowel obstruction, potentially due to adhesion. Vascular/Lymphatic: Aortic atherosclerosis. No enlarged abdominal or pelvic lymph nodes. Reproductive: Prostate is unremarkable. Other: Distal tip of peritoneal dialysis catheter is noted anteriorly within the pelvis. Mild amount of free fluid is noted in the more posterior and dependent portion of the pelvis. Musculoskeletal: Extensive postsurgical and degenerative changes are seen involving the lumbar spine. No acute abnormality is noted. IMPRESSION: 1. Continued presence of findings consistent with severe polycystic kidney disease. 5.4 x 5.1 cm hyperdense abnormality is seen involving anterior aspect of superior pole of left kidney which is enlarged compared to prior exam. This may represent enlarging hyperdense cyst, but possible neoplasm cannot be excluded. Further evaluation with MRI or ultrasound is recommended. 2. Proximal small bowel dilatation is noted with possible transition zone seen in the left upper quadrant best seen on image number 41 of series 3. More distal small bowel is not dilated. This is concerning for possible small bowel obstruction, potentially due to adhesion. 3. Mild amount of free fluid is noted in the more posterior and dependent portion of the pelvis. 4. Small left pleural effusion is noted with adjacent subsegmental atelectasis. Aortic Atherosclerosis (ICD10-I70.0). Electronically Signed   By: JBobbe MedicoD.  On: 08/25/2019 20:28   IR Fluoro Guide CV Line Right  Result Date: 08/30/2019 INDICATION: 64 year old male with a history of chronic renal disease referred for hemodialysis catheter placement. EXAM: IMAGE GUIDED PLACEMENT OF TEMPORARY HEMODIALYSIS CATHETER MEDICATIONS: None ANESTHESIA/SEDATION: None  FLUOROSCOPY TIME:  Fluoroscopy Time: 5 minutes 42 seconds (40 mGy). COMPLICATIONS: None PROCEDURE: Informed written consent was obtained from the patient after a discussion of the risks, benefits, and alternatives to treatment. Questions regarding the procedure were encouraged and answered. The right neck was prepped with chlorhexidine in a sterile fashion, and a sterile drape was applied covering the operative field. Maximum barrier sterile technique with sterile gowns and gloves were used for the procedure. A timeout was performed prior to the initiation of the procedure. Ultrasound was performed demonstrating patent right internal jugular vein. A micropuncture kit was utilized to access the right internal jugular vein under direct, real-time ultrasound guidance after the overlying soft tissues were anesthetized with 1% lidocaine with epinephrine. Ultrasound image documentation was performed. With the initial needle placement in the supraclavicular region, the microwire would not pass centrally. Limited venogram was performed with contrast. This demonstrated scarring and small caliber of the vein, with patency of the SVC. Needle was withdrawn. A second access site more cephalad was selected. 1% lidocaine was used for local anesthesia. Micropuncture needle was used to access the right internal jugular vein. The Nitrex microwire was navigated into a position in the low internal jugular vein. The microwire would not cross into the SVC. The micro puncture kit was advanced over the wire into the low IJ. The microwire and the inner dilator were removed. A standard Glidewire was then used to navigate into the subclavian vein. Angled catheter was then placed on the Glidewire, and using an angled catheter and the Glidewire we were able to successfully navigate the scarred region of the low internal jugular vein. The Glidewire was then used to make a measurement for the length of the catheter. A 20 cm temp cath was  selected. Guidewire was advanced to the level of the right atrium. A 20 cm hemodialysis catheter was then placed over the wire. Final catheter positioning was confirmed and documented with a spot radiographic image. The catheter aspirates and flushes normally. The catheter was flushed with appropriate volume heparin dwells. Dressings were applied. The patient tolerated the procedure well without immediate post procedural complication. 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 A low puncture will not allowed navigation of the scar region. The current temporary hemodialysis catheter cannot be converted. Would advise alternative site if the patient needs a tunneled hemodialysis catheter. IMPRESSION: Status post image guided placement of temporary hemodialysis catheter. Signed, Dulcy Fanny. Dellia Nims, RPVI Vascular [facility] Interventional Radiology Specialists [facility] Radiology Electronically Signed   By: Corrie Mckusick D.O.   On: 08/30/2019 11:26   IR US Guide Vasc Access Right  Result Date: 08/30/2019 INDICATION: 64 year old male with a history of chronic renal disease referred for hemodialysis catheter placement. EXAM: IMAGE GUIDED PLACEMENT OF TEMPORARY HEMODIALYSIS CATHETER MEDICATIONS: None ANESTHESIA/SEDATION: None FLUOROSCOPY TIME:  Fluoroscopy Time: 5 minutes 42 seconds (40 mGy). COMPLICATIONS: None PROCEDURE: Informed written consent was obtained from the patient after a discussion of the risks, benefits, and alternatives to treatment. Questions regarding the procedure were encouraged and answered. The right neck was prepped with chlorhexidine in a sterile fashion, and a sterile drape was applied covering the operative field. Maximum barrier sterile technique with sterile gowns and gloves were  used for the procedure. A timeout was performed prior to the initiation of the procedure. Ultrasound was performed demonstrating patent right internal jugular vein. A micropuncture kit  was utilized to access the right internal jugular vein under direct, real-time ultrasound guidance after the overlying soft tissues were anesthetized with 1% lidocaine with epinephrine. Ultrasound image documentation was performed. With the initial needle placement in the supraclavicular region, the microwire would not pass centrally. Limited venogram was performed with contrast. This demonstrated scarring and small caliber of the vein, with patency of the SVC. Needle was withdrawn. A second access site more cephalad was selected. 1% lidocaine was used for local anesthesia. Micropuncture needle was used to access the right internal jugular vein. The Nitrex microwire was navigated into a position in the low internal jugular vein. The microwire would not cross into the SVC. The micro puncture kit was advanced over the wire into the low IJ. The microwire and the inner dilator were removed. A standard Glidewire was then used to navigate into the subclavian vein. Angled catheter was then placed on the Glidewire, and using an angled catheter and the Glidewire we were able to successfully navigate the scarred region of the low internal jugular vein. The Glidewire was then used to make a measurement for the length of the catheter. A 20 cm temp cath was selected. Guidewire was advanced to the level of the right atrium. A 20 cm hemodialysis catheter was then placed over the wire. Final catheter positioning was confirmed and documented with a spot radiographic image. The catheter aspirates and flushes normally. The catheter was flushed with appropriate volume heparin dwells. Dressings were applied. The patient tolerated the procedure well without immediate post procedural complication. 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 A low puncture will not allowed navigation of the scar region. The current temporary hemodialysis catheter cannot be converted. Would advise alternative site if the  patient needs a tunneled hemodialysis catheter. IMPRESSION: Status post image guided placement of temporary hemodialysis catheter. Signed, Dulcy Fanny. Dellia Nims, RPVI Vascular and Interventional Radiology Specialists [facility] Radiology Electronically Signed   By: Corrie Mckusick D.O.   On: 08/30/2019 11:26   DG CHEST PORT 1 VIEW  Result Date: 08/29/2019 CLINICAL DATA:  PICC placement EXAM: PORTABLE CHEST 1 VIEW COMPARISON:  Radiograph 08/29/2019 FINDINGS: Transesophageal tube tip and side port below the GE junction, beyond the margins of imaging. A left upper extremity PICC tip terminates at the superior cavoatrial junction. Telemetry leads overlie the chest. Redemonstration of the patchy right basilar opacity with a background of more chronic coarsened interstitial change in apical lucency likely reflecting emphysematous features prior cross-sectional imaging. No convincing features of edema. No pneumothorax or visible effusion of the left costophrenic sulcus is partially collimated. The aorta is calcified. The remaining cardiomediastinal contours are unremarkable. No acute osseous or soft tissue abnormality. Degenerative changes are present in the imaged spine and shoulders. IMPRESSION: 1. Left upper extremity PICC tip terminates at the superior cavoatrial junction. 2. Transesophageal tube tip and side port below the GE junction, beyond the margins of imaging. 3. Unchanged patchy right basilar opacity. 4. Coarsened interstitial changes and chronic apical lucency compatible with patient's known COPD. Electronically Signed   By: Lovena Le M.D.   On: 08/29/2019 23:02   DG Chest Port 1 View  Result Date: 08/29/2019 CLINICAL DATA:  64 year old male with respiratory failure. EXAM: PORTABLE CHEST 1 VIEW COMPARISON:  Portable chest 08/25/2019 and earlier. FINDINGS: Portable AP  semi upright view at 1723 hours. Enteric tube placed, courses to the left upper quadrant with tip not included. No endotracheal tube  identified. Mediastinal contours are stable and within normal limits. Evidence of upper lung emphysema on a prior cervical spine CT 01/31/2018, and continued attenuation of upper lobe bronchovascular markings. Minimal patchy right lung base opacity not significantly changed since 08/22/2019. No pneumothorax, pulmonary edema or pleural effusion identified. IMPRESSION: 1. Enteric tube placed into the stomach with tip not included. 2. Emphysema with minimal right lung base opacity more resembling atelectasis than infection. Electronically Signed   By: Genevie Ann M.D.   On: 08/29/2019 17:34   DG CHEST PORT 1 VIEW  Result Date: 08/25/2019 CLINICAL DATA:  Shortness of breath. EXAM: PORTABLE CHEST 1 VIEW COMPARISON:  Chest x-ray dated Aug 22, 2019. FINDINGS: The heart size and mediastinal contours are within normal limits. Atherosclerotic calcification of the aortic arch. Normal pulmonary vascularity. Unchanged bibasilar atelectasis/scarring. Emphysematous changes again noted. No focal consolidation, pleural effusion, or pneumothorax. No acute osseous abnormality. IMPRESSION: No active disease.  Emphysema. Electronically Signed   By: Titus Dubin M.D.   On: 08/25/2019 11:27   DG Chest Port 1 View  Result Date: 08/22/2019 CLINICAL DATA:  Shortness of breath congestion EXAM: PORTABLE CHEST 1 VIEW COMPARISON:  May 26, 2019 FINDINGS: There is unchanged cardiomegaly. Aortic knob calcifications are seen. There is hyperinflation of the upper lung zones. Ill-defined interstitial reticulonodular opacity seen at the right lung base, more pronounced than on the prior exam. No pleural effusion is seen. No acute osseous abnormality. IMPRESSION: Interstitial reticulonodular opacity more pronounced at the right lung base than on the prior exam which could be due to atelectasis and/or infectious etiology. Findings suggestive of underlying COPD. Electronically Signed   By: Prudencio Pair M.D.   On: 08/22/2019 19:41   DG Abd  Portable 1V  Result Date: 08/29/2019 CLINICAL DATA:  Small bowel obstruction. EXAM: PORTABLE ABDOMEN - 1 VIEW COMPARISON:  Aug 27, 2019. FINDINGS: Distal tip of nasogastric tube is seen in expected position of distal stomach. Stable small bowel dilatation is noted concerning for distal small bowel obstruction. IMPRESSION: Distal tip of nasogastric tube is seen in expected position of distal stomach. Electronically Signed   By: Marijo Conception M.D.   On: 08/29/2019 08:50   DG Abd Portable 1V  Result Date: 08/27/2019 CLINICAL DATA:  Follow-up for small bowel obstruction. EXAM: PORTABLE ABDOMEN - 1 VIEW COMPARISON:  08/26/2019 and CT from 08/25/2019. FINDINGS: There are dilated small bowel loops predominantly in the left abdomen trauma similar to the previous day's study. Nasogastric tube tip projects in the distal stomach. IMPRESSION: 1. Persistent small-bowel dilation consistent with a partial small bowel obstruction, without significant change from the previous day's study. Electronically Signed   By: Lajean Manes M.D.   On: 08/27/2019 13:59   DG Abd Portable 1V-Small Bowel Obstruction Protocol-initial, 8 hr delay  Result Date: 08/26/2019 CLINICAL DATA:  9 hour delay, small-bowel protocol EXAM: PORTABLE ABDOMEN - 1 VIEW COMPARISON:  Radiograph 08/26/2019, CT 08/25/2019 FINDINGS: Multiple stacked air distended loops of small bowel are present. High-density contrast appears largely retained within the distended gastric lumen. Transesophageal tube tip and side port remain within the gastric body. No evidence of free intraperitoneal air. Basilar atelectatic changes. Additional support devices overlie the chest and abdomen. Foley catheter in place. Degenerative changes in the spine, hips and pelvis. IMPRESSION: Retention of contrast within the distended gastric lumen. Persistently dilated loops of small  bowel compatible with obstruction or ileus though the lack of transit is somewhat suggestive of adynamic  bowel motility. Electronically Signed   By: Lovena Le M.D.   On: 08/26/2019 21:21   DG Foot Complete Left  Result Date: 08/22/2019 CLINICAL DATA:  [place], great toe pain EXAM: LEFT FOOT - COMPLETE 3+ VIEW COMPARISON:  12/23/2017 FINDINGS: Frontal, oblique, and lateral views of the left foot are obtained. Chronic posttraumatic/postsurgical changes at the first metatarsal. Prior healed second metatarsal fracture unchanged. 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 Diffuse hammertoe deformities are noted. There are no acute displaced fractures. Prominent dorsal soft tissue swelling of the foot. Diffuse vascular calcifications. IMPRESSION: 1. Chronic postsurgical/posttraumatic changes of the first and second metatarsal. 2. Stable severe first metatarsophalangeal joint osteoarthritis. 3. Dorsal soft tissue swelling. 4. No acute fracture. Electronically Signed   By: Randa Ngo M.D.   On: 08/22/2019 19:41   [place] EKG SITE RITE  Result Date: 08/29/2019 If Site Rite image not attached, placement could not be confirmed due to current cardiac rhythm.   Labs:  CBC: Recent Labs    08/27/19 0526 08/28/19 0324 08/30/19 0500 08/30/19 1040  WBC 16.3* 10.7* 12.6* 12.9*  HGB 12.9* 12.8* 11.7* 12.3*  HCT 39.9 40.0 37.3* 38.3*  PLT 276 258 242 257    COAGS: Recent Labs    05/26/19 1541  INR 1.0  APTT 31    BMP: Recent Labs    08/28/19 0324 08/29/19 0345 08/30/19 1040 08/30/19 1138  NA 140 139 138 138  K 3.2* 3.2* 3.8 3.7  CL 101 96* 98 99  CO2 _0 GLUCOSE 135* 163* 114* 123*  BUN 61* 49* 55* 55*  CALCIUM 7.4* 7.8* 7.7* 7.5*  CREATININE 6.39* 6.12* 6.84* 6.77*  GFRNONAA 8* 9* 8* 8*  GFRAA 10* 10* 9* 9*    LIVER FUNCTION TESTS: Recent Labs    08/27/19 0526 08/27/19 0526 08/28/19 0324 08/29/19 0345 08/30/19 1040 08/30/19 1138  BILITOT 1.0  --  0.6 0.6 0.7  --   AST 18  --  13* 15 12*  --   ALT 12  --  9 9 <5  --   ALKPHOS 78  --  78  81 65  --   PROT 5.6*  --  5.7* 6.1* 5.5*  --   ALBUMIN 2.1*   < > 1.9* 2.1* 2.2* 2.0*   < > = values in this interval not displayed.    TUMOR MARKERS: No results for input(s): AFPTM, CEA, CA199, CHROMGRNA in the last 8760 hours.  Assessment and Plan:  64 y.o, male inpatient. History of ESRD on PD, a fib ( on eliquis) presented to this facility with [facility] found to be in a fib RVR with and SBO s/p exploratory lap on 5.17.21. IR placed a temporary catheter on 5.18.21 (high RIJ). Team is requesting conversion from temp cath to tunneled catheter for outpatient dialysis access. Team states that the current temp cath is not functional/ at this time.  Pertinent Imaging None since temp cath placement  Pertinent IR History 5.18.21 - placement temp cath RIJ 11.23.20 - removal of    Pertinent Allergies NKDA  WBC is 12.9 (team thinks it is inflammatory in nature) Cr 6.77, BUN 55 All labs and medications are within acceptable parameters.  Patient is afebrile.   IR consulted for possible tunneled hemodialysis catheter placement. Case has been reviewed and procedure approved by Dr. Earleen Newport.  Patient tentatively scheduled for 5.19.21.  Team instructed to: Keep Patient to be NPO after midnight Hold prophylactic anticoagulation 24 hours prior to scheduled procedure.   IR will call patient when ready.  Risks and benefits discussed with the patient including, but not limited to bleeding, infection, vascular injury, pneumothorax which may require chest tube placement, air embolism or even death  All of the patient's questions were answered, patient is agreeable to proceed.  Consent signed and in chart.    Thank you for this interesting consult.  I greatly enjoyed meeting Aaron [name] and look forward to participating in their care.  A copy of this report was sent to the requesting provider on this date.  Electronically Signed: Avel Peace, NP 08/30/2019, 2:04 PM   I spent a  total of 20 Minutes    in face to face in clinical consultation, greater than 50% of which was counseling/coordinating care for conversion of temp HD cath to  Tunneled catheter

## 2019-08-30 NOTE — Progress Notes (Signed)
   08/30/19 1543  Assess: MEWS Score  Temp 98.1 F (36.7 C)  BP 95/76  Pulse Rate (!) 105  Resp 20  Level of Consciousness Alert  SpO2 95 %  O2 Device Room Air  Assess: MEWS Score  MEWS Temp 0  MEWS Systolic 1  MEWS Pulse 1  MEWS RR 0  MEWS LOC 0  MEWS Score 2  MEWS Score Color Yellow  Assess: if the MEWS score is Yellow or Red  Were vital signs taken at a resting state? Yes  Focused Assessment Documented focused assessment  Early Detection of Sepsis Score *See Row Information* High  MEWS guidelines implemented *See Row Information* Yes  Treat  MEWS Interventions Administered scheduled meds/treatments;Administered prn meds/treatments  Take Vital Signs  Increase Vital Sign Frequency  Yellow: Q 2hr X 2 then Q 4hr X 2, if remains yellow, continue Q 4hrs  Escalate  MEWS: Escalate Yellow: discuss with charge nurse/RN and consider discussing with provider and RRT  Document  Patient Outcome Other (Comment) (will adminster IV metoprolol scheduled at 1800)    Patient returned from HD with elevated HR. 1200 dose of beta blocker not given will give scheduled dose at 1800. Patient also experiencing pain 6/10 and will give PRN pain medication

## 2019-08-30 NOTE — Procedures (Signed)
Interventional Radiology Procedure Note  Procedure: Placement of a temp right IJ approach trialysis catheter.  OK for use.  20cm. .  Tip is positioned at the superior cavoatrial junction and catheter is ready for immediate use.   Findings:  This catheter cannot be converted.  Too high.    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  This stricture required catheter navigation from a high IJ puncture.   Complications: None Recommendations:  - Ok to use - Do not submerge - Routine line care - Advise alternate site if tunneled is required   Signed,  Dulcy Fanny. Earleen Newport, DO

## 2019-08-30 NOTE — Progress Notes (Signed)
Renal Navigator met with patient at HD bedside to introduce self and inform patient that I am working with his OP HD clinic/[facility] to get him a seat schedule for the period of time he will need in-center HD.  Patient was very pleasant and welcoming of visit. He states he is not doing well and reports, "I've been thinking of having them take me off." Navigator asked patient to elaborate. He said he has been contemplating for the last month about "stopping dialysis altogether." He made the motion of his hand across his neck, indicating death. Navigator listed as patient talked about his multiple surgeries and how he feels like one medical issue leads to something worse and how just when he thinks he has something corrected, something else goes wrong. He states he does not know how to talk to his family about wanting to stop dialysis. Navigator offered supportive brief counseling and informed patient about our specialized Palliative Medicine Team. Navigator feels strongly that patient would benefit from a consult with PMT and patient is agreeable. Navigator requested of Dr. Lin/Nephrologist that he order a consult and he is agreeable. He asked if patient wanted to continue with plan from [facility] tomorrow and Navigator asked patient. Navigator explained per Dr. Augustin Coupe that we currently do not have a great way to dialyze patient without the [facility] and do not want him to feel pressured to make decisions. Patient states he does want the [facility] procedure tomorrow. Navigator informed Dr. Augustin Coupe that patient wishes to proceed with the [facility] tomorrow in order to give him time to make decisions and speak with his family. Patient was very appreciative of Navigator's visit. Navigator contacted Melanie/PMT RN to discuss case. Navigator appreciates support from team for patient and also provided update to Attending/Dr. Raelyn Mora.  Navigator will follow closely and is available for support and assistance as needed.  Alphonzo Cruise,  [place] Renal Navigator [phone]

## 2019-08-30 NOTE — Progress Notes (Signed)
[facility] Progress Note  1 Day Post-Op  Subjective:  Mild abdominal soreness. Requesting something cold to drink. Reports small amt flatus post-op. No BM.  Radiology tech at bedside to take patient down to IR for vascath. Pt had L PICC placed yesterday.   Objective: Vital signs in last 24 hours: Temp:  [96 F (35.6 C)-98.6 F (37 C)] 98.6 F (37 C) (05/18 0405) Pulse Rate:  [52-102] 98 (05/18 0405) Resp:  [12-18] 18 (05/18 0405) BP: (101-138)/(80-99) 114/86 (05/18 0405) SpO2:  [91 %-100 %] 96 % (05/18 0405) Weight:  [81.5 kg] 81.5 kg (05/17 1348) Last BM Date: 08/23/19  Intake/Output from previous day: 05/17 0701 - 05/18 0700 In: 2048.3 [P.O.:240; I.V.:1208.3; IV Piggyback:600] Out: 550 [Urine:200; Emesis/NG output:300; Blood:50] Intake/Output this shift: No intake/output data recorded.  PE: General: chronically ill appearing white male who is sitting in bed in NAD Heart: regular, rate, and rhythm.. No obvious murmurs, gallops, or rubs noted.  Palpable radial and pedal pulses bilaterally Lungs: CTAB.  Respiratory effort nonlabored Abd: soft, protuberant, appropriately tender around midline incision, moderately distended, midline incision with dressing in place - scant SS drainage on bandage. PD catheter palpable, previous laparotomy scar.  Lab Results:  Recent Labs    08/28/19 0324 08/30/19 0500  WBC 10.7* 12.6*  HGB 12.8* 11.7*  HCT 40.0 37.3*  PLT 258 242   BMET Recent Labs    08/28/19 0324 08/29/19 0345  NA 140 139  K 3.2* 3.2*  CL 101 96*  CO2 28 30  GLUCOSE 135* 163*  BUN 61* 49*  CREATININE 6.39* 6.12*  CALCIUM 7.4* 7.8*   PT/INR No results for input(s): LABPROT, INR in the last 72 hours. CMP     Component Value Date/Time   NA 139 08/29/2019 0345   K 3.2 (L) 08/29/2019 0345   CL 96 (L) 08/29/2019 0345   CO2 30 08/29/2019 0345   GLUCOSE 163 (H) 08/29/2019 0345   BUN 49 (H) 08/29/2019 0345   CREATININE 6.12 (H) 08/29/2019 0345    CALCIUM 7.8 (L) 08/29/2019 0345   PROT 6.1 (L) 08/29/2019 0345   ALBUMIN 2.1 (L) 08/29/2019 0345   AST 15 08/29/2019 0345   ALT 9 08/29/2019 0345   ALKPHOS 81 08/29/2019 0345   BILITOT 0.6 08/29/2019 0345   GFRNONAA 9 (L) 08/29/2019 0345   GFRAA 10 (L) 08/29/2019 0345   Lipase     Component Value Date/Time   LIPASE 23 08/25/2019 1740       Studies/Results: DG CHEST PORT 1 VIEW  Result Date: 08/29/2019 CLINICAL DATA:  PICC placement EXAM: PORTABLE CHEST 1 VIEW COMPARISON:  Radiograph 08/29/2019 FINDINGS: Transesophageal tube tip and side port below the GE junction, beyond the margins of imaging. A left upper extremity PICC tip terminates at the superior cavoatrial junction. Telemetry leads overlie the chest. Redemonstration of the patchy right basilar opacity with a background of more chronic coarsened interstitial change in apical lucency likely reflecting emphysematous features prior cross-sectional imaging. No convincing features of edema. No pneumothorax or visible effusion of the left costophrenic sulcus is partially collimated. The aorta is calcified. The remaining cardiomediastinal contours are unremarkable. No acute osseous or soft tissue abnormality. Degenerative changes are present in the imaged spine and shoulders. IMPRESSION: 1. Left upper extremity PICC tip terminates at the superior cavoatrial junction. 2. Transesophageal tube tip and side port below the GE junction, beyond the margins of imaging. 3. Unchanged patchy right basilar opacity. 4. Coarsened interstitial changes and chronic apical  lucency compatible with patient's known COPD. Electronically Signed   By: Lovena Le M.D.   On: 08/29/2019 23:02   DG Chest Port 1 View  Result Date: 08/29/2019 CLINICAL DATA:  64 year old male with respiratory failure. EXAM: PORTABLE CHEST 1 VIEW COMPARISON:  Portable chest 08/25/2019 and earlier. FINDINGS: Portable AP semi upright view at 1723 hours. Enteric tube placed, courses to  the left upper quadrant with tip not included. No endotracheal tube identified. Mediastinal contours are stable and within normal limits. Evidence of upper lung emphysema on a prior cervical spine CT 01/31/2018, and continued attenuation of upper lobe bronchovascular markings. Minimal patchy right lung base opacity not significantly changed since 08/22/2019. No pneumothorax, pulmonary edema or pleural effusion identified. IMPRESSION: 1. Enteric tube placed into the stomach with tip not included. 2. Emphysema with minimal right lung base opacity more resembling atelectasis than infection. Electronically Signed   By: Genevie Ann M.D.   On: 08/29/2019 17:34   DG Abd Portable 1V  Result Date: 08/29/2019 CLINICAL DATA:  Small bowel obstruction. EXAM: PORTABLE ABDOMEN - 1 VIEW COMPARISON:  Aug 27, 2019. FINDINGS: Distal tip of nasogastric tube is seen in expected position of distal stomach. Stable small bowel dilatation is noted concerning for distal small bowel obstruction. IMPRESSION: Distal tip of nasogastric tube is seen in expected position of distal stomach. Electronically Signed   By: Marijo Conception M.D.   On: 08/29/2019 08:50   [place] EKG SITE RITE  Result Date: 08/29/2019 If Site Rite image not attached, placement could not be confirmed due to current cardiac rhythm.   Anti-infectives: Anti-infectives (From admission, onward)   Start     Dose/Rate Route Frequency Ordered Stop   08/29/19 1145  ceFAZolin (ANCEF) IVPB 2g/100 mL premix     2 g 200 mL/hr over 30 Minutes Intravenous To [facility]Stay Surgical 08/29/19 1127 08/29/19 1445   08/23/19 2200  azithromycin (ZITHROMAX) tablet 500 mg  Status:  Discontinued    Note to Pharmacy: Pharmacy to adjust timing for next dose.   500 mg Oral Daily at bedtime 08/23/19 0233 08/25/19 2046   08/22/19 2130  azithromycin (ZITHROMAX) 500 mg in sodium chloride 0.9 % 250 mL IVPB     500 mg 250 mL/hr over 60 Minutes Intravenous  Once 08/22/19 2122 08/22/19 2245        Assessment/Plan CHF COPD A fib Polycystic kidney disease, ESRD, on PD - will need to convert to HD temporarily, going to [facility] this AM. H/O MI CAD Hep C HLD  SBO POD#1 exploratory laparotomy, LOA, repair of SB serosal injury x3, resection of mesenteric nodule 08/29/19 Dr. Bobbye Morton - NGT 350-400 cc/12h, continue to LIWS and AROBF - TPN  - follow path - OOB/mobilizing, PT/OT - IS 10x q 1h   FEN - NGT to ILWS  VTE -plavix on hold as of 5/13 ID -none currently   LOS: 8 days     Jill Alexanders , [facility] 08/30/2019, 8:05 AM Please see Amion for pager number during day hours 7:00am-4:30pm

## 2019-08-30 NOTE — Progress Notes (Signed)
PROGRESS NOTE    Aaron [name]  JGG:[id number] DOB: Aug 13, 1955 DOA: 08/22/2019 PCP: Gwenlyn Saran [place]    Brief Narrative:   64 year old gentleman with medical history significant for hypertension, hyperlipidemia, chronic atrial fibrillation on Eliquis, ESRD on peritoneal dialysis presented to the emergency department for evaluation of shortness of breath.  Patient does have history of COPD, he recently quit smoking few months ago.  He does have some wheezing at baseline.  Came to the ER with worsening shortness of breath.  In the emergency room, he was comfortable, found to have A. fib with RVR started on Cardizem drip.  Transfer to [facility] for progressive bed from [facility]. 5/13: Patient started having intermittent biliary vomiting.  CT scan shows small bowel obstruction.  Surgery consulted in the morning 5/14.  5/17: No improvement in a small bowel obstruction.  Patient went for ex lap by surgery. 5/18: PICC line for TPN.  Right IJ line temporary catheter for hemodialysis.   Assessment & Plan:   Principal Problem:   COPD with acute exacerbation ([place]) Active Problems:   Essential hypertension   ESRD on dialysis ([place])   Exertional dyspnea   Acute exacerbation of CHF (congestive heart failure) (HCC)   Atrial fibrillation with RVR (HCC)  COPD with acute exacerbation: Improved.  Continue breathing treatments and mobility.  Bronchodilator as needed.  Now off oxygen.  Small bowel obstruction secondary to adhesions: No improvement with conservative management.  Status post exploratory laparotomy. Postop management as per surgery.     Holding Eliquis and Plavix, 5/13.  Starting on TPN. Replace electrolytes as per TPN protocol by pharmacy.  Chronic atrial fibrillation with RVR: Heart rate better acceptable.  Unable to take by mouth.  Will change to metoprolol IV.  Hold all oral medications. Eliquis on hold.  Change to oral rate control medication when  he is able to take by mouth.  ESRD on peritoneal dialysis: Followed by nephrology.  Medication adjustment as per nephrology. Getting dialysis today.  DVT prophylaxis: SCDs.  Eliquis on hold. Code Status: Full code Family Communication: Patient's daughter on the phone. Disposition Plan: Status is: Inpatient  Remains inpatient appropriate because:Inpatient level of care appropriate due to severity of illness   Dispo: The patient is from: Home              Anticipated d/c is to: Home              Anticipated d/c date is: More than 3 days.              Patient currently is not medically stable to d/c.  Patient has small bowel obstruction, needing active inpatient treatment and management.   No new on hemodialysis.  Consultants:   Nephrology  General surgery  Procedures:   Peritoneal dialysis  Ex lap, 5/17  Left-sided PICC line 5/17  Right IJ temporary HD catheter, 5/18  Antimicrobials:   Azithromycin, 5/11--- 5/13   Subjective: Seen and examined.  Getting hemodialysis.  Really wants to eat something.  He thinks he had some gas but no bowel movement.  Some abdominal distention but denies any pain.  NG tube with plenty of drainage. Objective: Vitals:   08/30/19 1130 08/30/19 1200 08/30/19 1253 08/30/19 1300  BP: 122/74 116/80 131/78 114/66  Pulse: 61 (!) 59 92 66  Resp:      Temp:      TempSrc:      SpO2:      Weight:  Height:        Intake/Output Summary (Last 24 hours) at 08/30/2019 1338 Last data filed at 08/30/2019 0700 Gross per 24 hour  Intake 2048.28 ml  Output 550 ml  Net 1498.28 ml   Filed Weights   08/29/19 0444 08/29/19 1348 08/30/19 1120  Weight: 81.5 kg 81.5 kg 78.8 kg    Examination:  General exam: Appears calm and comfortable, chronically sick looking  .  On room air.  Getting dialysis.  NG tube with some biliary drain. Respiratory system: Bilateral clear.  No added sounds.  Some conducted airway sounds. Cardiovascular system: S1 & S2  heard, irregularly irregular, No JVD, murmurs, rubs, gallops or clicks. 1+ bilateral pedal edema . Gastrointestinal system: Abdomen is soft.  Mild tender along the surgical margins. Distended.  No bowel sounds. Central nervous system: Alert and oriented. No focal neurological deficits. Extremities: Symmetric 5 x 5 power. Skin: No rashes, lesions or ulcers Psychiatry: Judgement and insight appear normal. Mood & affect appropriate.     Data Reviewed: I have personally reviewed following labs and imaging studies  CBC: Recent Labs  Lab 08/25/19 1740 08/27/19 0526 08/28/19 0324 08/30/19 0500 08/30/19 1040  WBC 20.8* 16.3* 10.7* 12.6* 12.9*  NEUTROABS 19.9* 14.1* 9.3*  --   --   HGB 12.7* 12.9* 12.8* 11.7* 12.3*  HCT 39.2 39.9 40.0 37.3* 38.3*  MCV 99.2 101.8* 99.0 105.7* 99.7  PLT 340 276 258 242 202   Basic Metabolic Panel: Recent Labs  Lab 08/27/19 0526 08/28/19 0324 08/29/19 0345 08/29/19 1103 08/30/19 1040 08/30/19 1138  NA 143 140 139  --  138 138  K 3.9 3.2* 3.2*  --  3.8 3.7  CL 99 101 96*  --  98 99  CO2 '30 28 30  ' --  26 26  GLUCOSE 93 135* 163*  --  114* 123*  BUN 67* 61* 49*  --  55* 55*  CREATININE 6.80* 6.39* 6.12*  --  6.84* 6.77*  CALCIUM 7.6* 7.4* 7.8*  --  7.7* 7.5*  MG 1.7 1.5* 1.9  --  1.9  --   PHOS 7.2* 5.9*  --  5.8* 6.5* 6.3*   GFR: Estimated Creatinine Clearance: 12.4 mL/min (A) (by C-G formula based on SCr of 6.77 mg/dL (H)). Liver Function Tests: Recent Labs  Lab 08/27/19 0526 08/28/19 0324 08/29/19 0345 08/30/19 1040 08/30/19 1138  AST 18 13* 15 12*  --   ALT '12 9 9 ' <5  --   ALKPHOS 78 78 81 65  --   BILITOT 1.0 0.6 0.6 0.7  --   PROT 5.6* 5.7* 6.1* 5.5*  --   ALBUMIN 2.1* 1.9* 2.1* 2.2* 2.0*   Recent Labs  Lab 08/25/19 1740  LIPASE 23   No results for input(s): AMMONIA in the last 168 hours. Coagulation Profile: No results for input(s): INR, PROTIME in the last 168 hours. Cardiac Enzymes: No results for input(s): CKTOTAL,  CKMB, CKMBINDEX, TROPONINI in the last 168 hours. BNP (last 3 results) No results for input(s): PROBNP in the last 8760 hours. HbA1C: No results for input(s): HGBA1C in the last 72 hours. CBG: Recent Labs  Lab 08/29/19 2130 08/30/19 0002 08/30/19 0402  GLUCAP 110* 140* 131*   Lipid Profile: Recent Labs    08/30/19 1040  TRIG 63   Thyroid Function Tests: No results for input(s): TSH, T4TOTAL, FREET4, T3FREE, THYROIDAB in the last 72 hours. Anemia Panel: No results for input(s): VITAMINB12, FOLATE, FERRITIN, TIBC, IRON, RETICCTPCT in the last  72 hours. Sepsis Labs: No results for input(s): PROCALCITON, LATICACIDVEN in the last 168 hours.  Recent Results (from the past 240 hour(s))  SARS Coronavirus 2 by RT PCR (hospital order, performed in [facility] hospital lab) Nasopharyngeal Nasopharyngeal Swab     Status: None   Collection Time: 08/22/19  8:53 PM   Specimen: Nasopharyngeal Swab  Result Value Ref Range Status   SARS Coronavirus 2 NEGATIVE NEGATIVE Final    Comment: (NOTE) SARS-CoV-2 target nucleic acids are NOT DETECTED. The SARS-CoV-2 RNA is generally detectable in upper and lower respiratory specimens during the acute phase of infection. The lowest concentration of SARS-CoV-2 viral copies this assay can detect is 250 copies / mL. A negative result does not preclude SARS-CoV-2 infection and should not be used as the sole basis for treatment or other patient management decisions.  A negative result may occur with improper specimen collection / handling, submission of specimen other than nasopharyngeal swab, presence of viral mutation(s) within the areas targeted by this assay, and inadequate number of viral copies (<250 copies / mL). A negative result must be combined with clinical observations, patient history, and epidemiological information. Fact Sheet for Patients:   StrictlyIdeas.no Fact Sheet for Healthcare  Providers: BankingDealers.co.za This test is not yet approved or cleared  by the [place] FDA and has been authorized for detection and/or diagnosis of SARS-CoV-2 by FDA under an Emergency Use Authorization (EUA).  This EUA will remain in effect (meaning this test can be used) for the duration of the COVID-19 declaration under Section 564(b)(1) of the Act, 21 U.S.C. section 360bbb-3(b)(1), unless the authorization is terminated or revoked sooner. Performed at [facility], [address] [place], [place] [address]   Culture, blood (routine x 2)     Status: None   Collection Time: 08/22/19  9:34 PM   Specimen: BLOOD RIGHT ARM  Result Value Ref Range Status   Specimen Description BLOOD RIGHT ARM  Final   Special Requests   Final    BOTTLES DRAWN AEROBIC AND ANAEROBIC Blood Culture adequate volume   Culture   Final    NO GROWTH 5 DAYS Performed at [facility], [address], [place], [place] [address]    Report Status 08/27/2019 FINAL  Final  Culture, blood (routine x 2)     Status: None   Collection Time: 08/22/19  9:35 PM   Specimen: BLOOD RIGHT ARM  Result Value Ref Range Status   Specimen Description BLOOD RIGHT ARM  Final   Special Requests   Final    BOTTLES DRAWN AEROBIC AND ANAEROBIC Blood Culture adequate volume   Culture   Final    NO GROWTH 5 DAYS Performed at [facility], [address] [place], [place] [address]    Report Status 08/27/2019 FINAL  Final         Radiology Studies: IR Fluoro Guide CV Line Right  Result Date: 08/30/2019 INDICATION: 64 year old male with a history of chronic renal disease referred for hemodialysis catheter placement. EXAM: IMAGE GUIDED PLACEMENT OF TEMPORARY HEMODIALYSIS CATHETER MEDICATIONS: None ANESTHESIA/SEDATION: None FLUOROSCOPY TIME:  Fluoroscopy Time: 5 minutes 42 seconds (40 mGy). COMPLICATIONS: None PROCEDURE: Informed written consent was obtained from the patient after a discussion of the risks,  benefits, and alternatives to treatment. Questions regarding the procedure were encouraged and answered. The right neck was prepped with chlorhexidine in a sterile fashion, and a sterile drape was applied covering the operative field. Maximum barrier sterile technique with sterile gowns and gloves were  used for the procedure. A timeout was performed prior to the initiation of the procedure. Ultrasound was performed demonstrating patent right internal jugular vein. A micropuncture kit was utilized to access the right internal jugular vein under direct, real-time ultrasound guidance after the overlying soft tissues were anesthetized with 1% lidocaine with epinephrine. Ultrasound image documentation was performed. With the initial needle placement in the supraclavicular region, the microwire would not pass centrally. Limited venogram was performed with contrast. This demonstrated scarring and small caliber of the vein, with patency of the SVC. Needle was withdrawn. A second access site more cephalad was selected. 1% lidocaine was used for local anesthesia. Micropuncture needle was used to access the right internal jugular vein. The Nitrex microwire was navigated into a position in the low internal jugular vein. The microwire would not cross into the SVC. The micro puncture kit was advanced over the wire into the low IJ. The microwire and the inner dilator were removed. A standard Glidewire was then used to navigate into the subclavian vein. Angled catheter was then placed on the Glidewire, and using an angled catheter and the Glidewire we were able to successfully navigate the scarred region of the low internal jugular vein. The Glidewire was then used to make a measurement for the length of the catheter. A 20 cm temp cath was selected. Guidewire was advanced to the level of the right atrium. A 20 cm hemodialysis catheter was then placed over the wire. Final catheter positioning was confirmed and documented with a spot  radiographic image. The catheter aspirates and flushes normally. The catheter was flushed with appropriate volume heparin dwells. Dressings were applied. The patient tolerated the procedure well without immediate post procedural complication. 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 A low puncture will not allowed navigation of the scar region. The current temporary hemodialysis catheter cannot be converted. Would advise alternative site if the patient needs a tunneled hemodialysis catheter. IMPRESSION: Status post image guided placement of temporary hemodialysis catheter. Signed, Dulcy Fanny. Dellia Nims, RPVI Vascular and Interventional Radiology Specialists [facility] Radiology Electronically Signed   By: Corrie Mckusick D.O.   On: 08/30/2019 11:26   IR US Guide Vasc Access Right  Result Date: 08/30/2019 INDICATION: 64 year old male with a history of chronic renal disease referred for hemodialysis catheter placement. EXAM: IMAGE GUIDED PLACEMENT OF TEMPORARY HEMODIALYSIS CATHETER MEDICATIONS: None ANESTHESIA/SEDATION: None FLUOROSCOPY TIME:  Fluoroscopy Time: 5 minutes 42 seconds (40 mGy). COMPLICATIONS: None PROCEDURE: Informed written consent was obtained from the patient after a discussion of the risks, benefits, and alternatives to treatment. Questions regarding the procedure were encouraged and answered. The right neck was prepped with chlorhexidine in a sterile fashion, and a sterile drape was applied covering the operative field. Maximum barrier sterile technique with sterile gowns and gloves were used for the procedure. A timeout was performed prior to the initiation of the procedure. Ultrasound was performed demonstrating patent right internal jugular vein. A micropuncture kit was utilized to access the right internal jugular vein under direct, real-time ultrasound guidance after the overlying soft tissues were anesthetized with 1% lidocaine with epinephrine.  Ultrasound image documentation was performed. With the initial needle placement in the supraclavicular region, the microwire would not pass centrally. Limited venogram was performed with contrast. This demonstrated scarring and small caliber of the vein, with patency of the SVC. Needle was withdrawn. A second access site more cephalad was selected. 1% lidocaine was used for local anesthesia. Micropuncture needle was  used to access the right internal jugular vein. The Nitrex microwire was navigated into a position in the low internal jugular vein. The microwire would not cross into the SVC. The micro puncture kit was advanced over the wire into the low IJ. The microwire and the inner dilator were removed. A standard Glidewire was then used to navigate into the subclavian vein. Angled catheter was then placed on the Glidewire, and using an angled catheter and the Glidewire we were able to successfully navigate the scarred region of the low internal jugular vein. The Glidewire was then used to make a measurement for the length of the catheter. A 20 cm temp cath was selected. Guidewire was advanced to the level of the right atrium. A 20 cm hemodialysis catheter was then placed over the wire. Final catheter positioning was confirmed and documented with a spot radiographic image. The catheter aspirates and flushes normally. The catheter was flushed with appropriate volume heparin dwells. Dressings were applied. The patient tolerated the procedure well without immediate post procedural complication. 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 A low puncture will not allowed navigation of the scar region. The current temporary hemodialysis catheter cannot be converted. Would advise alternative site if the patient needs a tunneled hemodialysis catheter. IMPRESSION: Status post image guided placement of temporary hemodialysis catheter. Signed, Dulcy Fanny. Dellia Nims, RPVI Vascular and  Interventional Radiology Specialists [facility] Radiology Electronically Signed   By: Corrie Mckusick D.O.   On: 08/30/2019 11:26   DG CHEST PORT 1 VIEW  Result Date: 08/29/2019 CLINICAL DATA:  PICC placement EXAM: PORTABLE CHEST 1 VIEW COMPARISON:  Radiograph 08/29/2019 FINDINGS: Transesophageal tube tip and side port below the GE junction, beyond the margins of imaging. A left upper extremity PICC tip terminates at the superior cavoatrial junction. Telemetry leads overlie the chest. Redemonstration of the patchy right basilar opacity with a background of more chronic coarsened interstitial change in apical lucency likely reflecting emphysematous features prior cross-sectional imaging. No convincing features of edema. No pneumothorax or visible effusion of the left costophrenic sulcus is partially collimated. The aorta is calcified. The remaining cardiomediastinal contours are unremarkable. No acute osseous or soft tissue abnormality. Degenerative changes are present in the imaged spine and shoulders. IMPRESSION: 1. Left upper extremity PICC tip terminates at the superior cavoatrial junction. 2. Transesophageal tube tip and side port below the GE junction, beyond the margins of imaging. 3. Unchanged patchy right basilar opacity. 4. Coarsened interstitial changes and chronic apical lucency compatible with patient's known COPD. Electronically Signed   By: Lovena Le M.D.   On: 08/29/2019 23:02   DG Chest Port 1 View  Result Date: 08/29/2019 CLINICAL DATA:  64 year old male with respiratory failure. EXAM: PORTABLE CHEST 1 VIEW COMPARISON:  Portable chest 08/25/2019 and earlier. FINDINGS: Portable AP semi upright view at 1723 hours. Enteric tube placed, courses to the left upper quadrant with tip not included. No endotracheal tube identified. Mediastinal contours are stable and within normal limits. Evidence of upper lung emphysema on a prior cervical spine CT 01/31/2018, and continued attenuation of upper lobe  bronchovascular markings. Minimal patchy right lung base opacity not significantly changed since 08/22/2019. No pneumothorax, pulmonary edema or pleural effusion identified. IMPRESSION: 1. Enteric tube placed into the stomach with tip not included. 2. Emphysema with minimal right lung base opacity more resembling atelectasis than infection. Electronically Signed   By: Genevie Ann M.D.   On: 08/29/2019 17:34   DG Abd Portable  1V  Result Date: 08/29/2019 CLINICAL DATA:  Small bowel obstruction. EXAM: PORTABLE ABDOMEN - 1 VIEW COMPARISON:  Aug 27, 2019. FINDINGS: Distal tip of nasogastric tube is seen in expected position of distal stomach. Stable small bowel dilatation is noted concerning for distal small bowel obstruction. IMPRESSION: Distal tip of nasogastric tube is seen in expected position of distal stomach. Electronically Signed   By: Marijo Conception M.D.   On: 08/29/2019 08:50   [place] EKG SITE RITE  Result Date: 08/29/2019 If Site Rite image not attached, placement could not be confirmed due to current cardiac rhythm.       Scheduled Meds: . atorvastatin  80 mg Oral Daily  . budesonide (PULMICORT) nebulizer solution  0.25 mg Nebulization BID  . calcium acetate  1,334 mg Oral TID WC  . Chlorhexidine Gluconate Cloth  6 each Topical Daily  . gabapentin  300 mg Oral QHS  . gentamicin cream  1 application Topical Daily  . heparin sodium (porcine)      . insulin aspart  0-6 Units Subcutaneous Q4H  . ipratropium-albuterol  3 mL Nebulization BID  . lidocaine      . metoprolol tartrate  5 mg Intravenous Q6H  . pantoprazole (PROTONIX) IV  40 mg Intravenous Q12H  . sodium chloride flush  10-40 mL Intracatheter Q12H   Continuous Infusions: . sodium chloride    . sodium chloride    . acetaminophen 1,000 mg (08/30/19 1101)  . TPN ADULT (ION) 30 mL/hr at 08/29/19 2323  . TPN ADULT (ION)       LOS: 8 days    Time spent: 25 minutes    Barb Merino, MD [facility] Pager  [phone]

## 2019-08-30 NOTE — Progress Notes (Signed)
tx paused system clotted.

## 2019-08-30 NOTE — Progress Notes (Signed)
PT Cancellation Note  Patient Details Name: Aaron [name] MRN: [id number] DOB: 28-Jun-1955   Cancelled Treatment:    Reason Eval/Treat Not Completed: Patient at procedure or test/unavailable. Patient at HD this afternoon. Will re-attempt at later time/day.     Amad Mau 08/30/2019, 2:33 PM

## 2019-08-30 NOTE — Plan of Care (Signed)
  Problem: Clinical Measurements: Goal: Will remain free from infection Outcome: Progressing   Problem: Activity: Goal: Risk for activity intolerance will decrease Outcome: Progressing   

## 2019-08-30 NOTE — Progress Notes (Signed)
Patient's home dialysis clinic is [facility]. Per inpatient Nephrologist/Dr. Augustin Coupe, patient will need 1-2 months of HD due to surgery for small bowel obstruction yesterday. He will transition from PD to HD prior to discharge.  Navigator contacted patient's clinic to request an in-center HD seat schedule and was asked to call back tomorrow when facility administrator is there. Navigator agreed.  Alphonzo Cruise, [place] [facility] Navigator [phone]

## 2019-08-30 NOTE — Progress Notes (Signed)
  PHARMACY - TOTAL PARENTERAL NUTRITION CONSULT NOTE   Indication: Small bowel obstruction since 08/25/19  Patient Measurements: Height: 6\' 4"  (193 cm) Weight: 81.5 kg (179 lb 10.8 oz) IBW/kg (Calculated) : 86.8 TPN AdjBW (KG): 81.5 Body mass index is 21.87 kg/m. Usual Weight: 180 lbs  Assessment:  64 yo M admitted for COPD exacerbation and afib who developed SBO on 08/25/19. Last PO intake was 5/12 so patient is at moderate risk for refeeding syndrome. Weight has been stable at 180-200 lbs in the last 5 years with no weight loss until this admission, confirmed with patient.   Glucose / Insulin: BG <160 since stopping methylpredisolone. A1C 5.9 Electrolytes: Labs - unable to obtain, due to lipemic sample K 3.2 > IV 40 mEq, Mg 1.9, Phos 5.9 on 5/16 Renal: ESRD on PD, switch to HD per Gen Surg due to suboptimal dialysis on PD and abdominal surgery LFTs / TGs: Wnl  Prealbumin / albumin: Albumin 2.1  Intake / Output; MIVF: NGT output not recorded, still makes urine 300-664ml/d GI Imaging: 5/13 CT concerning for possible small bowel obstruction Surgeries / Procedures: Planning 5/17 ex lap  Central access: PICC line 08/29/19 TPN start date: 08/29/19  Nutritional Goals (RD recommendation 5/17): Kcal:  2500-3704; Protein:  125-140 grams Fluid: minimize for ESRD Goal TPN rate is 70 mL/hr (provides 126g of protein, 302g dextrose, 59g lipids and 2120 kcals per day)  Current Nutrition:  NPO  Plan:  Start TPN at 38mL/hr at 1800. TPN today provides: AA 54g, Dextrose 130g, 656 Kcal). Removing lipids secondary to grossly lipemic blood sample.Using Clinisol 15% for ESRD patient.  Electrolytes in TPN: 66mEq/L of Na, no K, Ca, Mag or Phos.  Max Cl  Add standard MVI and trace elements but remove chromium (due to ESRD) Initiate very Sensitive Q4h SSI and adjust as needed  Monitor TPN labs daily until at goal and stable, then on Mon/Thurs only  Alanda Slim, PharmD, [place] Pharmacist Please  see AMION for all Pharmacists' Contact Phone Numbers 08/30/2019, 11:08 AM

## 2019-08-31 ENCOUNTER — Inpatient Hospital Stay (HOSPITAL_COMMUNITY): Payer: 59

## 2019-08-31 DIAGNOSIS — E440 Moderate protein-calorie malnutrition: Secondary | ICD-10-CM | POA: Insufficient documentation

## 2019-08-31 HISTORY — PX: IR FLUORO GUIDE CV LINE LEFT: IMG2282

## 2019-08-31 HISTORY — PX: IR US GUIDE VASC ACCESS LEFT: IMG2389

## 2019-08-31 LAB — CBC
HCT: 39.4 % (ref 39.0–52.0)
Hemoglobin: 12.6 g/dL — ABNORMAL LOW (ref 13.0–17.0)
MCH: 31.9 pg (ref 26.0–34.0)
MCHC: 32 g/dL (ref 30.0–36.0)
MCV: 99.7 fL (ref 80.0–100.0)
Platelets: 250 10*3/uL (ref 150–400)
RBC: 3.95 MIL/uL — ABNORMAL LOW (ref 4.22–5.81)
RDW: 14.4 % (ref 11.5–15.5)
WBC: 13.2 10*3/uL — ABNORMAL HIGH (ref 4.0–10.5)
nRBC: 0 % (ref 0.0–0.2)

## 2019-08-31 LAB — GLUCOSE, CAPILLARY
Glucose-Capillary: 100 mg/dL — ABNORMAL HIGH (ref 70–99)
Glucose-Capillary: 106 mg/dL — ABNORMAL HIGH (ref 70–99)
Glucose-Capillary: 111 mg/dL — ABNORMAL HIGH (ref 70–99)
Glucose-Capillary: 113 mg/dL — ABNORMAL HIGH (ref 70–99)
Glucose-Capillary: 97 mg/dL (ref 70–99)

## 2019-08-31 LAB — COMPREHENSIVE METABOLIC PANEL
ALT: 6 U/L (ref 0–44)
AST: 16 U/L (ref 15–41)
Albumin: 2.1 g/dL — ABNORMAL LOW (ref 3.5–5.0)
Alkaline Phosphatase: 71 U/L (ref 38–126)
Anion gap: 13 (ref 5–15)
BUN: 40 mg/dL — ABNORMAL HIGH (ref 8–23)
CO2: 26 mmol/L (ref 22–32)
Calcium: 7.6 mg/dL — ABNORMAL LOW (ref 8.9–10.3)
Chloride: 98 mmol/L (ref 98–111)
Creatinine, Ser: 5.19 mg/dL — ABNORMAL HIGH (ref 0.61–1.24)
GFR calc Af Amer: 13 mL/min — ABNORMAL LOW (ref >60)
GFR calc non Af Amer: 11 mL/min — ABNORMAL LOW (ref >60)
Glucose, Bld: 92 mg/dL (ref 70–99)
Potassium: 3.6 mmol/L (ref 3.5–5.1)
Sodium: 137 mmol/L (ref 135–145)
Total Bilirubin: 0.8 mg/dL (ref 0.3–1.2)
Total Protein: 5.7 g/dL — ABNORMAL LOW (ref 6.5–8.1)

## 2019-08-31 LAB — PHOSPHORUS: Phosphorus: 3.7 mg/dL (ref 2.5–4.6)

## 2019-08-31 LAB — MAGNESIUM: Magnesium: 1.8 mg/dL (ref 1.7–2.4)

## 2019-08-31 LAB — SURGICAL PATHOLOGY

## 2019-08-31 MED ORDER — INSULIN ASPART 100 UNIT/ML ~~LOC~~ SOLN: 0.0000 [IU] | Freq: Four times a day (QID) | SUBCUTANEOUS | Status: DC

## 2019-08-31 MED ORDER — SODIUM CHLORIDE 0.9 % IV SOLN
INTRAVENOUS | Status: AC | PRN
  Administered 2019-08-31: 10 mL/h via INTRAVENOUS

## 2019-08-31 MED ORDER — BOOST / RESOURCE BREEZE PO LIQD CUSTOM
1.0000 | Freq: Two times a day (BID) | ORAL | Status: DC
  Administered 2019-08-31: 1 via ORAL

## 2019-08-31 MED ORDER — LIDOCAINE HCL 1 % IJ SOLN
INTRAMUSCULAR | Status: AC
  Filled 2019-08-31: qty 20

## 2019-08-31 MED ORDER — HEPARIN SODIUM (PORCINE) 1000 UNIT/ML IJ SOLN
INTRAMUSCULAR | Status: AC
  Filled 2019-08-31: qty 1

## 2019-08-31 MED ORDER — MIDAZOLAM HCL 2 MG/2ML IJ SOLN
INTRAMUSCULAR | Status: AC
  Filled 2019-08-31: qty 2

## 2019-08-31 MED ORDER — CEFAZOLIN SODIUM-DEXTROSE 2-4 GM/100ML-% IV SOLN: 2.0000 g | Freq: Once | INTRAVENOUS | Status: AC

## 2019-08-31 MED ORDER — GELATIN ABSORBABLE 12-7 MM EX MISC
CUTANEOUS | Status: AC
  Filled 2019-08-31: qty 1

## 2019-08-31 MED ORDER — MIDAZOLAM HCL 2 MG/2ML IJ SOLN
INTRAMUSCULAR | Status: AC | PRN
  Administered 2019-08-31 (×2): 1 mg via INTRAVENOUS

## 2019-08-31 MED ORDER — FENTANYL CITRATE (PF) 100 MCG/2ML IJ SOLN
INTRAMUSCULAR | Status: AC | PRN
  Administered 2019-08-31 (×2): 50 ug via INTRAVENOUS

## 2019-08-31 MED ORDER — CHLORHEXIDINE GLUCONATE 4 % EX LIQD
CUTANEOUS | Status: AC
  Filled 2019-08-31: qty 15

## 2019-08-31 MED ORDER — CEFAZOLIN (ANCEF) 1 G IV SOLR: 2.0000 g | INTRAVENOUS | Status: DC

## 2019-08-31 MED ORDER — HEPARIN (PORCINE) 25000 UT/250ML-% IV SOLN
1350.0000 [IU]/h | INTRAVENOUS | Status: AC
  Administered 2019-08-31: 1100 [IU]/h via INTRAVENOUS
  Administered 2019-09-01 – 2019-09-02 (×2): 1350 [IU]/h via INTRAVENOUS
  Filled 2019-08-31 (×3): qty 250

## 2019-08-31 MED ORDER — CEFAZOLIN SODIUM-DEXTROSE 2-4 GM/100ML-% IV SOLN
INTRAVENOUS | Status: AC
  Administered 2019-08-31: 2 g via INTRAVENOUS
  Filled 2019-08-31: qty 100

## 2019-08-31 MED ORDER — FENTANYL CITRATE (PF) 100 MCG/2ML IJ SOLN
INTRAMUSCULAR | Status: AC
  Filled 2019-08-31: qty 2

## 2019-08-31 MED ORDER — TRACE MINERALS CU-MN-SE-ZN 300-55-60-3000 MCG/ML IV SOLN
INTRAVENOUS | Status: AC
  Filled 2019-08-31: qty 840

## 2019-08-31 NOTE — Progress Notes (Signed)
Occupational Therapy Treatment + Discharge Patient Details Name: Aaron [name] MRN: [id number] DOB: 02/13/1956 Today's Date: 08/31/2019    History of present illness Pt is a 64 y/o male admitted secondary to worsening SOB thought to be from COPD exacerbation. Pt also with a fib and RVR. PMH includes HTN, COPD, ESRD on PD, CHF, and a fib.    OT comments  Pt d/c today as pt met all goals.  Pt simulating tub transfer with RW and supervisionA. Pt able to hip hike or use figure 4 technique for LB ADL with no difficulty. Pt education on energy conservation techniques. O2 >90% on RA; HR 110 PBM with exertion. Pt does not require continued OT skilled services. HHOT follow-up if needed per pt. Advised pt to be supervised for bath transfers. OT signing off.    Follow Up Recommendations  Home health OT    Equipment Recommendations  None recommended by OT    Recommendations for Other Services      Precautions / Restrictions Precautions Precautions: Fall Precaution Comments: NG tube Restrictions Weight Bearing Restrictions: No       Mobility Bed Mobility Overal bed mobility: Needs Assistance Bed Mobility: Supine to Sit     Supine to sit: Supervision     General bed mobility comments: pt in recliner pre and post session  Transfers Overall transfer level: Needs assistance   Transfers: Sit to/from Stand Sit to Stand: Supervision         General transfer comment: no physical assist at this time    Balance Overall balance assessment: Needs assistance   Sitting balance-Leahy Scale: Good       Standing balance-Leahy Scale: Fair Standing balance comment: Reliant on BUE support during transfers/mobility                           ADL either performed or assessed with clinical judgement   ADL Overall ADL's : At baseline                                     Functional mobility during ADLs: Supervision/safety;Rolling walker General ADL Comments:  SupervisionA  for ADL and mobility. Pt simulating tub transfer with RW and supervisionA. Pt able to hip hike or use figure 4 technique for LB ADL with no difficulty. Pt education on energy conservation techniques.      Vision   Vision Assessment?: No apparent visual deficits   Perception     Praxis      Cognition Arousal/Alertness: Awake/alert Behavior During Therapy: WFL for tasks assessed/performed Overall Cognitive Status: Within Functional Limits for tasks assessed                                          Exercises     Shoulder Instructions       General Comments O2 >90% on RA; HR 110 PBM with exertion    Pertinent Vitals/ Pain       Pain Assessment: 0-10 Pain Score: 5  Pain Location: abdomen  Pain Descriptors / Indicators: Grimacing Pain Intervention(s): Monitored during session  Home Living  Prior Functioning/Environment              Frequency  Min 2X/week        Progress Toward Goals  OT Goals(current goals can now be found in the care plan section)  Progress towards OT goals: Goals met/education completed, patient discharged from OT  Acute Rehab OT Goals Patient Stated Goal: to go home OT Goal Formulation: With patient Time For Goal Achievement: 08/31/19 ADL Goals Pt Will Transfer to Toilet: with modified independence;ambulating Pt Will Perform Tub/Shower Transfer: with modified independence;ambulating;rolling walker  Plan All goals met and education completed, patient discharged from OT services;Discharge plan needs to be updated    Co-evaluation                 AM-PAC OT "6 Clicks" Daily Activity     Outcome Measure   Help from another person eating meals?: None Help from another person taking care of personal grooming?: None Help from another person toileting, which includes using toliet, bedpan, or urinal?: None Help from another person bathing  (including washing, rinsing, drying)?: A Little Help from another person to put on and taking off regular upper body clothing?: None Help from another person to put on and taking off regular lower body clothing?: None 6 Click Score: 23    End of Session Equipment Utilized During Treatment: Rolling walker  OT Visit Diagnosis: Unsteadiness on feet (R26.81);Other abnormalities of gait and mobility (R26.89);Repeated falls (R29.6);Muscle weakness (generalized) (M62.81);Pain Pain - part of body: (BLEs)   Activity Tolerance Patient tolerated treatment well   Patient Left in chair;with call bell/phone within reach;with chair alarm set   Nurse Communication Mobility status        Time: 3244-0102 OT Time Calculation (min): 15 min  Charges: OT General Charges $OT Visit: 1 Visit OT Treatments $Therapeutic Activity: 8-22 mins  Aaron [name], OTR/L [place] Pager: [phone] Office: [phone]    Aaron [name]  C 08/31/2019, 1:51 PM

## 2019-08-31 NOTE — Progress Notes (Signed)
Nutrition Follow-up  DOCUMENTATION CODES:   Non-severe (moderate) malnutrition in context of chronic illness  INTERVENTION:   -TPN management per pharmacy -Boost Breeze po BID, each supplement provides 250 kcal and 9 grams of protein -RD will follow for diet advancement and adjust supplement regimen as appropriate  NUTRITION DIAGNOSIS:   Moderate Malnutrition related to chronic illness(COPD, ESRD on PD) as evidenced by energy intake < 75% for > or equal to 1 month, mild fat depletion, moderate fat depletion, mild muscle depletion, moderate muscle depletion.  Ongoing  GOAL:   Patient will meet greater than or equal to 90% of their needs  Progressing   MONITOR:   PO intake, Supplement acceptance, Diet advancement, Labs, Weight trends, Skin, I & O's  REASON FOR ASSESSMENT:   Rounds, Other (Comment)(New TPN)    ASSESSMENT:   Aaron [name] is a 64 y.o. male with medical history significant of hypertension hyperlipidemia, atrial fibrillation on Eliquis, ESRD on peritoneal dialysis presented to ED for evaluation of worsening shortness of breath  5/13- CT reveals partial SBO 5/14- NGT placed 5/16- clamp NGT, advanced to clear liquid diet 5/17- NPO, s/p Procedure: exploratory laparotomy, lysis of adhesions, small bowel serosal repair x3, resection of mesenteric nodule, attempted central venous catheter placement; PICC placed; TPN initiated 5/18- s/p placement of temporary IJ trialysis catheter   Reviewed I/O's: +1.1 L x 24 hours and +1.5 L since admission  Per general surgery notes, ex lap revealed small bowel obstruction with multiple sites of dense adhesions and early development of small bowel volvulus without bowel compromise, mesenteric nodule, Meckel's diverticulum, and nodular liver.   Case discussed with RN prior to visit, who reports PD is on hold and plans to transition to HD. Pt awaiting catheter exhange today. NGT is currently clamped and pt is tolerating well.  Likely plan to advance to clear liquid diet and remove NGT if pt tolerates clamping trial well today.   Spoke with pt at bedside, who was pleasant and in good spirits today. He denies any abdominal pain, or nausea and vomiting today. Pt is anxious to get NGT out, stating it is uncomfortable; discussed rationale for NGT and plan per general surgery today. Pt reports variable intake over the past week PTA; he shares that some days he would eat well, however, also ate very little other days secondary to early satiety and bloating. He has been eating a lot of [place] food lately ("I think that's what did me in").   Pt shares that he has progressively lost weight over the past 3 years (has been on and off renal replacement therapy during this time). Per his reports he has lost from 265# down to 190# ("you can't have anything to eat on dialysis, it's no wonder everyone loses weight when they are on it"). Per wt hx; pt has experienced a 9.4% wt loss over the past 7 months, which while not significant for time frame, is still concerning. Pt reports he has been on PD and had been tolerating treatments well.  Pt currently receiving TPN at 30 ml/hr, which provides 656 kcals and 54 grams protein, meeting 27% of estimated kcal needs and 43% of estimated protein needs. Per pharmacy note, plan to advance TPN to 70 ml/hr at 1800, which provides 2120 kcals and 126 grams protein, meeting 87% of estimated kcal needs and 100% of estimated protein needs.   Discussed how pt was receiving the majority of his nutrition at this time, as well as potential plan for diet  advancement. Pt open to RD suggestions, including supplements once diet is advanced.   Per nephrology notes, plan to transition from PD to HD at discharge; renal navigator working on arrangements.   Labs reviewed: K, Mg, and Phos WDL. CBGS: 106-119 (inpatient orders for glycemic control are 0-6 units insulin aspart every 6 hours).   NUTRITION - FOCUSED PHYSICAL  EXAM:    Most Recent Value  Orbital Region  Moderate depletion  Upper Arm Region  Moderate depletion  Thoracic and Lumbar Region  Mild depletion  Buccal Region  Mild depletion  Temple Region  Mild depletion  Clavicle Bone Region  Mild depletion  Clavicle and Acromion Bone Region  Mild depletion  Scapular Bone Region  Mild depletion  Dorsal Hand  Mild depletion  Patellar Region  Moderate depletion  Anterior Thigh Region  Moderate depletion  Posterior Calf Region  Moderate depletion  Edema (RD Assessment)  None  Hair  Reviewed  Eyes  Reviewed  Mouth  Reviewed  Skin  Reviewed  Nails  Reviewed       Diet Order:   Diet Order            Diet clear liquid Room service appropriate? Yes; Fluid consistency: Thin  Diet effective now              EDUCATION NEEDS:   Education needs have been addressed  Skin:  Skin Assessment: Skin Integrity Issues: Skin Integrity Issues:: Incisions Incisions: closed rt neck, abdomen  Last BM:  08/23/19  Height:   Ht Readings from Last 1 Encounters:  08/29/19 6\' 4"  (1.93 m)    Weight:   Wt Readings from Last 1 Encounters:  08/31/19 77.3 kg    Ideal Body Weight:  91.8 kg  BMI:  Body mass index is 20.74 kg/m.  Estimated Nutritional Needs:   Kcal:  7858-8502  Protein:  125-140 grams  Fluid:  1000 mg + UOP    Loistine Chance, RD, LDN, CDCES Registered Dietitian II Certified Diabetes Care and Education Specialist Please refer to [facility] for RD and/or RD on-call/weekend/after hours pager

## 2019-08-31 NOTE — Progress Notes (Signed)
[facility] Progress Note   1. SOB/COPD/pulmonary edema-Felt to be multifactorial - COPD/ Afib with rapid rate and volume overload. PD changes to increase ultrafiltration. S/p Steroids/inhalers/azithro.  2. ESRD-secondary to polycystic kidney disease. Has been on PD for 6 mos. followed in [place]. Albumin is low which can be problematic in PD and UF. Used 4.25% PD fluid for max UF->over evning of 5/16 to 5/17 1657 ml net UF.  - Will need to convert to HD through [facility] for 1-2 mths after surgery 5/17  ->VIR order placed by surgery. Had a diff placement of a RIJ temp but unfortunately the cath is not working well and also can't leave the hospital with a temp.  - Appreciate VIR agreeing to convert to a tunneled catheter.   - Plan on HD Thur; pt wants the tunneled catheter to buy time so he can talk more with his family. But his plan is to eventually stop hemodialysis.  Lt sided  PICC line in place  (5/17) for TPN.  3. Anemia- not an issue right now 4. Secondary hyperparathyroidism - calc OK- Phos5.8 (ok for now). We will continue to monitor; will start binder once he's eating. 5. HTN/volume-continue carvedilol and diltiazem for atrial fibrillation. Ultrafiltration as above. 6. Leukocytosis:Likely associated with steroids (13.2k). Fluid studies from peritoneal fluid negative for infection. 7. Partial small bowel obstruction: N.p.o. except for meds. Holding Lasix and off of steroids. Surgery following and appreciate assistance; dense adhesions on 5/17. 8. Hyperdense kidney cyst: Consider further evaluation with MRI in the future. 9.Hypokalemia: replete as needed.  Assessment/ Plan:   Feelsok and no events overnight; pain in abd.   Denies f/cn/v/dyspnea; +appetite.  Subjective:   General:supine inbed Heart:bradycardia Lungs:Coarse bilateral breath sounds, no increased work of breathing Abdomen:Distented with +BS Extremities: Trace  edema Dialysis Access: PD cath, RIJ temp  Access: Lt arm PICC   Objective:   BP (!) 131/101   Pulse (!) 102   Temp 98.2 F (36.8 C) (Oral)   Resp 18   Ht '6\' 4"'  (1.93 m)   Wt 77.3 kg   SpO2 95%   BMI 20.74 kg/m   Intake/Output Summary (Last 24 hours) at 08/31/2019 1610 Last data filed at 08/31/2019 9604 Gross per 24 hour  Intake 1134.08 ml  Output 300 ml  Net 834.08 ml   Weight change: -2.7 kg  Physical Exam: General:supine inbed Heart:bradycardia Lungs:Coarse bilateral breath sounds, no increased work of breathing Abdomen:Distented with +BS Extremities: Trace edema Dialysis Access: PD cath Access: Lt arm PICC  Imaging: IR Fluoro Guide CV Line Right  Result Date: 08/30/2019 INDICATION: 64 year old male with a history of chronic renal disease referred for hemodialysis catheter placement. EXAM: IMAGE GUIDED PLACEMENT OF TEMPORARY HEMODIALYSIS CATHETER MEDICATIONS: None ANESTHESIA/SEDATION: None FLUOROSCOPY TIME:  Fluoroscopy Time: 5 minutes 42 seconds (40 mGy). COMPLICATIONS: None PROCEDURE: Informed written consent was obtained from the patient after a discussion of the risks, benefits, and alternatives to treatment. Questions regarding the procedure were encouraged and answered. The right neck was prepped with chlorhexidine in a sterile fashion, and a sterile drape was applied covering the operative field. Maximum barrier sterile technique with sterile gowns and gloves were used for the procedure. A timeout was performed prior to the initiation of the procedure. Ultrasound was performed demonstrating patent right internal jugular vein. A micropuncture kit was utilized to access the right internal jugular vein under direct, real-time ultrasound guidance after the overlying soft tissues were anesthetized with 1% lidocaine with epinephrine. Ultrasound image documentation  was performed. With the initial needle placement in the supraclavicular region, the microwire would not pass  centrally. Limited venogram was performed with contrast. This demonstrated scarring and small caliber of the vein, with patency of the SVC. Needle was withdrawn. A second access site more cephalad was selected. 1% lidocaine was used for local anesthesia. Micropuncture needle was used to access the right internal jugular vein. The Nitrex microwire was navigated into a position in the low internal jugular vein. The microwire would not cross into the SVC. The micro puncture kit was advanced over the wire into the low IJ. The microwire and the inner dilator were removed. A standard Glidewire was then used to navigate into the subclavian vein. Angled catheter was then placed on the Glidewire, and using an angled catheter and the Glidewire we were able to successfully navigate the scarred region of the low internal jugular vein. The Glidewire was then used to make a measurement for the length of the catheter. A 20 cm temp cath was selected. Guidewire was advanced to the level of the right atrium. A 20 cm hemodialysis catheter was then placed over the wire. Final catheter positioning was confirmed and documented with a spot radiographic image. The catheter aspirates and flushes normally. The catheter was flushed with appropriate volume heparin dwells. Dressings were applied. The patient tolerated the procedure well without immediate post procedural complication. 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 A low puncture will not allowed navigation of the scar region. The current temporary hemodialysis catheter cannot be converted. Would advise alternative site if the patient needs a tunneled hemodialysis catheter. IMPRESSION: Status post image guided placement of temporary hemodialysis catheter. Signed, Dulcy Fanny. Dellia Nims, RPVI Vascular and Interventional Radiology Specialists [facility] Radiology Electronically Signed   By: Corrie Mckusick D.O.   On: 08/30/2019 11:26   IR US Guide Vasc  Access Right  Result Date: 08/30/2019 INDICATION: 64 year old male with a history of chronic renal disease referred for hemodialysis catheter placement. EXAM: IMAGE GUIDED PLACEMENT OF TEMPORARY HEMODIALYSIS CATHETER MEDICATIONS: None ANESTHESIA/SEDATION: None FLUOROSCOPY TIME:  Fluoroscopy Time: 5 minutes 42 seconds (40 mGy). COMPLICATIONS: None PROCEDURE: Informed written consent was obtained from the patient after a discussion of the risks, benefits, and alternatives to treatment. Questions regarding the procedure were encouraged and answered. The right neck was prepped with chlorhexidine in a sterile fashion, and a sterile drape was applied covering the operative field. Maximum barrier sterile technique with sterile gowns and gloves were used for the procedure. A timeout was performed prior to the initiation of the procedure. Ultrasound was performed demonstrating patent right internal jugular vein. A micropuncture kit was utilized to access the right internal jugular vein under direct, real-time ultrasound guidance after the overlying soft tissues were anesthetized with 1% lidocaine with epinephrine. Ultrasound image documentation was performed. With the initial needle placement in the supraclavicular region, the microwire would not pass centrally. Limited venogram was performed with contrast. This demonstrated scarring and small caliber of the vein, with patency of the SVC. Needle was withdrawn. A second access site more cephalad was selected. 1% lidocaine was used for local anesthesia. Micropuncture needle was used to access the right internal jugular vein. The Nitrex microwire was navigated into a position in the low internal jugular vein. The microwire would not cross into the SVC. The micro puncture kit was advanced over the wire into the low IJ. The microwire and the inner dilator were removed. A standard Glidewire was then  used to navigate into the subclavian vein. Angled catheter was then placed on  the Glidewire, and using an angled catheter and the Glidewire we were able to successfully navigate the scarred region of the low internal jugular vein. The Glidewire was then used to make a measurement for the length of the catheter. A 20 cm temp cath was selected. Guidewire was advanced to the level of the right atrium. A 20 cm hemodialysis catheter was then placed over the wire. Final catheter positioning was confirmed and documented with a spot radiographic image. The catheter aspirates and flushes normally. The catheter was flushed with appropriate volume heparin dwells. Dressings were applied. The patient tolerated the procedure well without immediate post procedural complication. 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 A low puncture will not allowed navigation of the scar region. The current temporary hemodialysis catheter cannot be converted. Would advise alternative site if the patient needs a tunneled hemodialysis catheter. IMPRESSION: Status post image guided placement of temporary hemodialysis catheter. Signed, Dulcy Fanny. Dellia Nims, RPVI Vascular and Interventional Radiology Specialists [facility] Radiology Electronically Signed   By: Corrie Mckusick D.O.   On: 08/30/2019 11:26   DG CHEST PORT 1 VIEW  Result Date: 08/29/2019 CLINICAL DATA:  PICC placement EXAM: PORTABLE CHEST 1 VIEW COMPARISON:  Radiograph 08/29/2019 FINDINGS: Transesophageal tube tip and side port below the GE junction, beyond the margins of imaging. A left upper extremity PICC tip terminates at the superior cavoatrial junction. Telemetry leads overlie the chest. Redemonstration of the patchy right basilar opacity with a background of more chronic coarsened interstitial change in apical lucency likely reflecting emphysematous features prior cross-sectional imaging. No convincing features of edema. No pneumothorax or visible effusion of the left costophrenic sulcus is partially collimated. The  aorta is calcified. The remaining cardiomediastinal contours are unremarkable. No acute osseous or soft tissue abnormality. Degenerative changes are present in the imaged spine and shoulders. IMPRESSION: 1. Left upper extremity PICC tip terminates at the superior cavoatrial junction. 2. Transesophageal tube tip and side port below the GE junction, beyond the margins of imaging. 3. Unchanged patchy right basilar opacity. 4. Coarsened interstitial changes and chronic apical lucency compatible with patient's known COPD. Electronically Signed   By: Lovena Le M.D.   On: 08/29/2019 23:02   DG Chest Port 1 View  Result Date: 08/29/2019 CLINICAL DATA:  64 year old male with respiratory failure. EXAM: PORTABLE CHEST 1 VIEW COMPARISON:  Portable chest 08/25/2019 and earlier. FINDINGS: Portable AP semi upright view at 1723 hours. Enteric tube placed, courses to the left upper quadrant with tip not included. No endotracheal tube identified. Mediastinal contours are stable and within normal limits. Evidence of upper lung emphysema on a prior cervical spine CT 01/31/2018, and continued attenuation of upper lobe bronchovascular markings. Minimal patchy right lung base opacity not significantly changed since 08/22/2019. No pneumothorax, pulmonary edema or pleural effusion identified. IMPRESSION: 1. Enteric tube placed into the stomach with tip not included. 2. Emphysema with minimal right lung base opacity more resembling atelectasis than infection. Electronically Signed   By: Genevie Ann M.D.   On: 08/29/2019 17:34   DG Abd Portable 1V  Result Date: 08/29/2019 CLINICAL DATA:  Small bowel obstruction. EXAM: PORTABLE ABDOMEN - 1 VIEW COMPARISON:  Aug 27, 2019. FINDINGS: Distal tip of nasogastric tube is seen in expected position of distal stomach. Stable small bowel dilatation is noted concerning for distal small bowel obstruction. IMPRESSION: Distal tip of nasogastric tube is seen  in expected position of distal stomach.  Electronically Signed   By: Marijo Conception M.D.   On: 08/29/2019 08:50   [place] EKG SITE RITE  Result Date: 08/29/2019 If Site Rite image not attached, placement could not be confirmed due to current cardiac rhythm.   Labs: BMET Recent Labs  Lab 08/26/19 0631 08/27/19 0526 08/28/19 0324 08/29/19 0345 08/29/19 1103 08/30/19 1040 08/30/19 1138 08/31/19 0500  NA 142 143 140 139  --  138 138 137  K 3.6 3.9 3.2* 3.2*  --  3.8 3.7 3.6  CL 96* 99 101 96*  --  98 99 98  CO2 '29 30 28 30  ' --  '26 26 26  ' GLUCOSE 155* 93 135* 163*  --  114* 123* 92  BUN 66* 67* 61* 49*  --  55* 55* 40*  CREATININE 6.89* 6.80* 6.39* 6.12*  --  6.84* 6.77* 5.19*  CALCIUM 8.2* 7.6* 7.4* 7.8*  --  7.7* 7.5* 7.6*  PHOS 7.3* 7.2* 5.9*  --  5.8* 6.5* 6.3* 3.7   CBC Recent Labs  Lab 08/25/19 1740 08/25/19 1740 08/27/19 0526 08/27/19 0526 08/28/19 0324 08/30/19 0500 08/30/19 1040 08/31/19 0500  WBC 20.8*   < > 16.3*   < > 10.7* 12.6* 12.9* 13.2*  NEUTROABS 19.9*  --  14.1*  --  9.3*  --   --   --   HGB 12.7*   < > 12.9*   < > 12.8* 11.7* 12.3* 12.6*  HCT 39.2   < > 39.9   < > 40.0 37.3* 38.3* 39.4  MCV 99.2   < > 101.8*   < > 99.0 105.7* 99.7 99.7  PLT 340   < > 276   < > 258 242 257 250   < > = values in this interval not displayed.    Medications:    . atorvastatin  80 mg Oral Daily  . budesonide (PULMICORT) nebulizer solution  0.25 mg Nebulization BID  . calcium acetate  1,334 mg Oral TID WC  . Chlorhexidine Gluconate Cloth  6 each Topical Daily  . gabapentin  300 mg Oral QHS  . gentamicin cream  1 application Topical Daily  . insulin aspart  0-6 Units Subcutaneous Q6H  . ipratropium-albuterol  3 mL Nebulization BID  . metoprolol tartrate  5 mg Intravenous Q6H  . pantoprazole (PROTONIX) IV  40 mg Intravenous Q12H  . sodium chloride flush  10-40 mL Intracatheter Q12H      Otelia Santee, MD 08/31/2019, 8:42 AM

## 2019-08-31 NOTE — Progress Notes (Signed)
PROGRESS NOTE    Aaron [name]  JKK:[id number] DOB: 1955-06-06 DOA: 08/22/2019 PCP: Gwenlyn Saran Bartholomew   Brief Narrative: 64 year old with past medical history significant for hypertension, hyperlipidemia, chronic A. fib on Eliquis, ESRD on peritoneal dialysis presented to emergency department for evaluation of shortness of breath.  Patient does have a history of COPD, he recently quit smoking few months ago.  He does have some wheezing at baseline.  Came to the ER with worsening shortness of breath. In the emergency room, he was comfortable, found to have A. fib with RVR started on Cardizem drip.  [facility] to [name] from [facility].  On 5/13 patient is started having intermittent biliary vomiting.  CT scan showed a small bowel obstruction.  Surgery consulted in the morning of 514.  On 517 no improvement in the small bowel obstruction.  Patient went for exploratory laparotomy by surgery.  On 518 PICC line for TPN.  Right IJ line temporary catheter for hemodialysis.  Right IJ for hemodialysis not working, and to proceed with IR for tunneled catheter  Assessment & Plan:   Principal Problem:   COPD with acute exacerbation ([place]) Active Problems:   Essential hypertension   ESRD on dialysis ([place])   Exertional dyspnea   Acute exacerbation of CHF (congestive heart failure) (HCC)   Atrial fibrillation with RVR (HCC)   Malnutrition of moderate degree  1-COPD with acute exacerbation: Improved Continue with bronchodilators as needed.  Off of oxygen  2-Small bowel obstruction secondary to adhesion: No improvement with conservative management.  Status post exploratory laparotomy Holding  Eliquis and Plavix since 5/13 Started on TPN Discussed  with surgery okay to resume heparin drip for A. fib if needed.  3-chronic A. fib with RVR: On IV metoprolol Holding Eliquis Discussed with surgery okay to start heparin drip. Discussed with IR, ok to start  Heparin Gtt.  ESRD  on peritoneal dialysis: Follow by Nephrology.  He will need to convert to hemodialysis through DC for 1 or 2 months after surgery 5/17  Nutrition Problem: Moderate Malnutrition Etiology: chronic illness(COPD, ESRD on PD)    Signs/Symptoms: energy intake < 75% for > or equal to 1 month, mild fat depletion, moderate fat depletion, mild muscle depletion, moderate muscle depletion    Interventions: TPN, Boost Breeze  Estimated body mass index is 20.74 kg/m as calculated from the following:   Height as of this encounter: _0  (1.93 m).   Weight as of this encounter: 77.3 kg.   DVT prophylaxis: Heparin  Code Status: Full code Family Communication: care discussed with patient Disposition Plan:  Status is: Inpatient  Needs to arrange out patient HD, advancing diet post sx, start heparin gt for A fib  Dispo: The patient is from: Home               Anticipated d/c is to: Home               Anticipated d/c date is: 1-2 days              Patient currently ; not medical stable         Consultants:   General surgery   IR  Procedures:   none  Antimicrobials:    Subjective: Denies worsening pain, passing gas  Objective: Vitals:   08/31/19 1405 08/31/19 1410 08/31/19 1442 08/31/19 1516  BP: (!) 134/113 (!) 134/108 (!) 136/103 (!) 135/101  Pulse: (!) 111 (!) 115 60 97  Resp: 12  14    Temp:   97.6 F (36.4 C)   TempSrc:   Oral   SpO2: 100% 97% 91% 95%  Weight:      Height:        Intake/Output Summary (Last 24 hours) at 08/31/2019 1810 Last data filed at 08/31/2019 1543 Gross per 24 hour  Intake 1473.89 ml  Output 350 ml  Net 1123.89 ml   Filed Weights   08/30/19 1120 08/30/19 1506 08/31/19 0404  Weight: 78.8 kg 76.7 kg 77.3 kg    Examination:  General exam: Appears calm and comfortable  Respiratory system: Clear to auscultation. Respiratory effort normal. Cardiovascular system: S1 & S2 heard, RRR. No JVD, murmurs, rubs, gallops  or clicks. No pedal edema. Gastrointestinal system: Abdomen is soft, peritoneal catheter in place, incision clean dressing Central nervous system: Alert and oriented.. Extremities: Symmetric 5 x 5 power. Skin: No rashes, lesions or ulcers   Data Reviewed: I have personally reviewed following labs and imaging studies  CBC: Recent Labs  Lab 08/25/19 1740 08/25/19 1740 08/27/19 0526 08/28/19 0324 08/30/19 0500 08/30/19 1040 08/31/19 0500  WBC 20.8*   < > 16.3* 10.7* 12.6* 12.9* 13.2*  NEUTROABS 19.9*  --  14.1* 9.3*  --   --   --   HGB 12.7*   < > 12.9* 12.8* 11.7* 12.3* 12.6*  HCT 39.2   < > 39.9 40.0 37.3* 38.3* 39.4  MCV 99.2   < > 101.8* 99.0 105.7* 99.7 99.7  PLT 340   < > 276 258 242 257 250   < > = values in this interval not displayed.   Basic Metabolic Panel: Recent Labs  Lab 08/27/19 0526 08/27/19 0526 08/28/19 0324 08/29/19 0345 08/29/19 1103 08/30/19 1040 08/30/19 1138 08/31/19 0500  NA 143   < > 140 139  --  138 138 137  K 3.9   < > 3.2* 3.2*  --  3.8 3.7 3.6  CL 99   < > 101 96*  --  98 99 98  CO2 30   < > 28 30  --  _0 GLUCOSE 93   < > 135* 163*  --  114* 123* 92  BUN 67*   < > 61* 49*  --  55* 55* 40*  CREATININE 6.80*   < > 6.39* 6.12*  --  6.84* 6.77* 5.19*  CALCIUM 7.6*   < > 7.4* 7.8*  --  7.7* 7.5* 7.6*  MG 1.7  --  1.5* 1.9  --  1.9  --  1.8  PHOS 7.2*   < > 5.9*  --  5.8* 6.5* 6.3* 3.7   < > = values in this interval not displayed.   GFR: Estimated Creatinine Clearance: 15.9 mL/min (A) (by C-G formula based on SCr of 5.19 mg/dL (H)). Liver Function Tests: Recent Labs  Lab 08/27/19 0526 08/27/19 0526 08/28/19 0324 08/29/19 0345 08/30/19 1040 08/30/19 1138 08/31/19 0500  AST 18  --  13* 15 12*  --  16  ALT 12  --  9 9 <5  --  6  ALKPHOS 78  --  78 81 65  --  71  BILITOT 1.0  --  0.6 0.6 0.7  --  0.8  PROT 5.6*  --  5.7* 6.1* 5.5*  --  5.7*  ALBUMIN 2.1*   < > 1.9* 2.1* 2.2* 2.0* 2.1*   < > = values in this interval not  displayed.   Recent Labs  Lab  08/25/19 1740  LIPASE 23   No results for input(s): AMMONIA in the last 168 hours. Coagulation Profile: No results for input(s): INR, PROTIME in the last 168 hours. Cardiac Enzymes: No results for input(s): CKTOTAL, CKMB, CKMBINDEX, TROPONINI in the last 168 hours. BNP (last 3 results) No results for input(s): PROBNP in the last 8760 hours. HbA1C: No results for input(s): HGBA1C in the last 72 hours. CBG: Recent Labs  Lab 08/31/19 0001 08/31/19 0401 08/31/19 0720 08/31/19 1136 08/31/19 1641  GLUCAP 113* 106* 100* 111* 97   Lipid Profile: Recent Labs    08/30/19 1040  TRIG 63   Thyroid Function Tests: No results for input(s): TSH, T4TOTAL, FREET4, T3FREE, THYROIDAB in the last 72 hours. Anemia Panel: No results for input(s): VITAMINB12, FOLATE, FERRITIN, TIBC, IRON, RETICCTPCT in the last 72 hours. Sepsis Labs: No results for input(s): PROCALCITON, LATICACIDVEN in the last 168 hours.  Recent Results (from the past 240 hour(s))  SARS Coronavirus 2 by RT PCR (hospital order, performed in [facility] hospital lab) Nasopharyngeal Nasopharyngeal Swab     Status: None   Collection Time: 08/22/19  8:53 PM   Specimen: Nasopharyngeal Swab  Result Value Ref Range Status   SARS Coronavirus 2 NEGATIVE NEGATIVE Final    Comment: (NOTE) SARS-CoV-2 target nucleic acids are NOT DETECTED. The SARS-CoV-2 RNA is generally detectable in upper and lower respiratory specimens during the acute phase of infection. The lowest concentration of SARS-CoV-2 viral copies this assay can detect is 250 copies / mL. A negative result does not preclude SARS-CoV-2 infection and should not be used as the sole basis for treatment or other patient management decisions.  A negative result may occur with improper specimen collection / handling, submission of specimen other than nasopharyngeal swab, presence of viral mutation(s) within the areas targeted by this assay, and  inadequate number of viral copies (<250 copies / mL). A negative result must be combined with clinical observations, patient history, and epidemiological information. Fact Sheet for Patients:   StrictlyIdeas.no Fact Sheet for Healthcare Providers: BankingDealers.co.za This test is not yet approved or cleared  by the [place] FDA and has been authorized for detection and/or diagnosis of SARS-CoV-2 by FDA under an Emergency Use Authorization (EUA).  This EUA will remain in effect (meaning this test can be used) for the duration of the COVID-19 declaration under Section 564(b)(1) of the Act, 21 U.S.C. section 360bbb-3(b)(1), unless the authorization is terminated or revoked sooner. Performed at [facility], [address], [place], [place] [address]   Culture, blood (routine x 2)     Status: None   Collection Time: 08/22/19  9:34 PM   Specimen: BLOOD RIGHT ARM  Result Value Ref Range Status   Specimen Description BLOOD RIGHT ARM  Final   Special Requests   Final    BOTTLES DRAWN AEROBIC AND ANAEROBIC Blood Culture adequate volume   Culture   Final    NO GROWTH 5 DAYS Performed at [facility], [address] [place], [place] [address]    Report Status 08/27/2019 FINAL  Final  Culture, blood (routine x 2)     Status: None   Collection Time: 08/22/19  9:35 PM   Specimen: BLOOD RIGHT ARM  Result Value Ref Range Status   Specimen Description BLOOD RIGHT ARM  Final   Special Requests   Final    BOTTLES DRAWN AEROBIC AND ANAEROBIC Blood Culture adequate volume   Culture   Final    NO GROWTH 5 DAYS  Performed at [facility], [address] [place], [place] [address]    Report Status 08/27/2019 FINAL  Final         Radiology Studies: IR Fluoro Guide CV Line Left  Result Date: 08/31/2019 INDICATION: 64 year old with end-stage renal disease. Patient currently has a non tunneled right jugular catheter and needs a new tunneled  dialysis catheter. The right jugular catheter was difficult to place and plan for placement of a left jugular tunneled dialysis catheter. EXAM: FLUOROSCOPIC AND ULTRASOUND GUIDED PLACEMENT OF A TUNNELED DIALYSIS CATHETER Physician: Stephan Minister. Anselm Pancoast, MD MEDICATIONS: Ancef 2 g; The antibiotic was administered within an appropriate time interval prior to skin puncture. ANESTHESIA/SEDATION: Versed 2.0 mg IV; Fentanyl 100 mcg IV; Moderate Sedation Time:  16 minutes The patient was continuously monitored during the procedure by the interventional radiology nurse under my direct supervision. FLUOROSCOPY TIME:  Fluoroscopy Time: 30 seconds, 5.3 mGy COMPLICATIONS: None immediate. PROCEDURE: The procedure was explained to the patient. The risks and benefits of the procedure were discussed and the patient's questions were addressed. Informed consent was obtained from the patient. The patient was placed supine on the interventional table. Ultrasound confirmed a patent left internal jugular vein. Ultrasound images were obtained for documentation. The left neck and chest was prepped and draped in a sterile fashion. The left neck was anesthetized with 1% lidocaine. Maximal barrier sterile technique was utilized including caps, mask, sterile gowns, sterile gloves, sterile drape, hand hygiene and skin antiseptic. A small incision was made with #11 blade scalpel. A 21 gauge needle directed into the left internal jugular vein with ultrasound guidance. A micropuncture dilator set was placed. A 28 cm tip to cuff Palindrome catheter was selected. The skin below the left clavicle was anesthetized and a small incision was made with an #11 blade scalpel. A subcutaneous tunnel was formed to the vein dermatotomy site. The catheter was brought through the tunnel. The vein dermatotomy site was dilated to accommodate a peel-away sheath. The catheter was placed through the peel-away sheath and directed into the central venous structures. The tip of  the catheter was placed in the right atrium with fluoroscopy. Fluoroscopic images were obtained for documentation. Both lumens were found to aspirate and flush well. The proper amount of heparin was flushed in both lumens. The vein dermatotomy site was closed using a single layer of absorbable suture and Dermabond. Gel-Foam was placed in the subcutaneous tunnel. The catheter was secured to the skin using Prolene suture. Right jugular catheter was removed at the end of the procedure. IMPRESSION: Successful placement of a left jugular tunneled dialysis catheter using ultrasound and fluoroscopic guidance. Electronically Signed   By: Markus Daft M.D.   On: 08/31/2019 15:29   IR Fluoro Guide CV Line Right  Result Date: 08/30/2019 INDICATION: 64 year old male with a history of chronic renal disease referred for hemodialysis catheter placement. EXAM: IMAGE GUIDED PLACEMENT OF TEMPORARY HEMODIALYSIS CATHETER MEDICATIONS: None ANESTHESIA/SEDATION: None FLUOROSCOPY TIME:  Fluoroscopy Time: 5 minutes 42 seconds (40 mGy). COMPLICATIONS: None PROCEDURE: Informed written consent was obtained from the patient after a discussion of the risks, benefits, and alternatives to treatment. Questions regarding the procedure were encouraged and answered. The right neck was prepped with chlorhexidine in a sterile fashion, and a sterile drape was applied covering the operative field. Maximum barrier sterile technique with sterile gowns and gloves were used for the procedure. A timeout was performed prior to the initiation of the procedure. Ultrasound was performed demonstrating patent right internal jugular vein.  A micropuncture kit was utilized to access the right internal jugular vein under direct, real-time ultrasound guidance after the overlying soft tissues were anesthetized with 1% lidocaine with epinephrine. Ultrasound image documentation was performed. With the initial needle placement in the supraclavicular region, the microwire  would not pass centrally. Limited venogram was performed with contrast. This demonstrated scarring and small caliber of the vein, with patency of the SVC. Needle was withdrawn. A second access site more cephalad was selected. 1% lidocaine was used for local anesthesia. Micropuncture needle was used to access the right internal jugular vein. The Nitrex microwire was navigated into a position in the low internal jugular vein. The microwire would not cross into the SVC. The micro puncture kit was advanced over the wire into the low IJ. The microwire and the inner dilator were removed. A standard Glidewire was then used to navigate into the subclavian vein. Angled catheter was then placed on the Glidewire, and using an angled catheter and the Glidewire we were able to successfully navigate the scarred region of the low internal jugular vein. The Glidewire was then used to make a measurement for the length of the catheter. A 20 cm temp cath was selected. Guidewire was advanced to the level of the right atrium. A 20 cm hemodialysis catheter was then placed over the wire. Final catheter positioning was confirmed and documented with a spot radiographic image. The catheter aspirates and flushes normally. The catheter was flushed with appropriate volume heparin dwells. Dressings were applied. The patient tolerated the procedure well without immediate post procedural complication. 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 A low puncture will not allowed navigation of the scar region. The current temporary hemodialysis catheter cannot be converted. Would advise alternative site if the patient needs a tunneled hemodialysis catheter. IMPRESSION: Status post image guided placement of temporary hemodialysis catheter. Signed, Dulcy Fanny. Dellia Nims, RPVI [facility] Radiology Electronically Signed   By: Corrie Mckusick D.O.   On: 08/30/2019 11:26    IR US Guide Vasc Access Right  Result Date: 08/30/2019 INDICATION: 64 year old male with a history of chronic renal disease referred for hemodialysis catheter placement. EXAM: IMAGE GUIDED PLACEMENT OF TEMPORARY HEMODIALYSIS CATHETER MEDICATIONS: None ANESTHESIA/SEDATION: None FLUOROSCOPY TIME:  Fluoroscopy Time: 5 minutes 42 seconds (40 mGy). COMPLICATIONS: None PROCEDURE: Informed written consent was obtained from the patient after a discussion of the risks, benefits, and alternatives to treatment. Questions regarding the procedure were encouraged and answered. The right neck was prepped with chlorhexidine in a sterile fashion, and a sterile drape was applied covering the operative field. Maximum barrier sterile technique with sterile gowns and gloves were used for the procedure. A timeout was performed prior to the initiation of the procedure. Ultrasound was performed demonstrating patent right internal jugular vein. A micropuncture kit was utilized to access the right internal jugular vein under direct, real-time ultrasound guidance after the overlying soft tissues were anesthetized with 1% lidocaine with epinephrine. Ultrasound image documentation was performed. With the initial needle placement in the supraclavicular region, the microwire would not pass centrally. Limited venogram was performed with contrast. This demonstrated scarring and small caliber of the vein, with patency of the SVC. Needle was withdrawn. A second access site more cephalad was selected. 1% lidocaine was used for local anesthesia. Micropuncture needle was used to access the right internal jugular vein. The Nitrex microwire was navigated into a position in the low internal jugular vein. The microwire  would not cross into the SVC. The micro puncture kit was advanced over the wire into the low IJ. The microwire and the inner dilator were removed. A standard Glidewire was then used to navigate into the subclavian vein. Angled catheter was  then placed on the Glidewire, and using an angled catheter and the Glidewire we were able to successfully navigate the scarred region of the low internal jugular vein. The Glidewire was then used to make a measurement for the length of the catheter. A 20 cm temp cath was selected. Guidewire was advanced to the level of the right atrium. A 20 cm hemodialysis catheter was then placed over the wire. Final catheter positioning was confirmed and documented with a spot radiographic image. The catheter aspirates and flushes normally. The catheter was flushed with appropriate volume heparin dwells. Dressings were applied. The patient tolerated the procedure well without immediate post procedural complication. 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 A low puncture will not allowed navigation of the scar region. The current temporary hemodialysis catheter cannot be converted. Would advise alternative site if the patient needs a tunneled hemodialysis catheter. IMPRESSION: Status post image guided placement of temporary hemodialysis catheter. Signed, Dulcy Fanny. Dellia Nims, RPVI [facility] Radiology Electronically Signed   By: Corrie Mckusick D.O.   On: 08/30/2019 11:26   DG CHEST PORT 1 VIEW  Result Date: 08/29/2019 CLINICAL DATA:  PICC placement EXAM: PORTABLE CHEST 1 VIEW COMPARISON:  Radiograph 08/29/2019 FINDINGS: Transesophageal tube tip and side port below the GE junction, beyond the margins of imaging. A left upper extremity PICC tip terminates at the superior cavoatrial junction. Telemetry leads overlie the chest. Redemonstration of the patchy right basilar opacity with a background of more chronic coarsened interstitial change in apical lucency likely reflecting emphysematous features prior cross-sectional imaging. No convincing features of edema. No pneumothorax or visible effusion of the left costophrenic sulcus is partially  collimated. The aorta is calcified. The remaining cardiomediastinal contours are unremarkable. No acute osseous or soft tissue abnormality. Degenerative changes are present in the imaged spine and shoulders. IMPRESSION: 1. Left upper extremity PICC tip terminates at the superior cavoatrial junction. 2. Transesophageal tube tip and side port below the GE junction, beyond the margins of imaging. 3. Unchanged patchy right basilar opacity. 4. Coarsened interstitial changes and chronic apical lucency compatible with patient's known COPD. Electronically Signed   By: Lovena Le M.D.   On: 08/29/2019 23:02        Scheduled Meds: . atorvastatin  80 mg Oral Daily  . budesonide (PULMICORT) nebulizer solution  0.25 mg Nebulization BID  . calcium acetate  1,334 mg Oral TID WC  . chlorhexidine      . Chlorhexidine Gluconate Cloth  6 each Topical Daily  . feeding supplement  1 Container Oral BID BM  . fentaNYL      . gabapentin  300 mg Oral QHS  . gelatin adsorbable      . gentamicin cream  1 application Topical Daily  . heparin sodium (porcine)      . insulin aspart  0-6 Units Subcutaneous Q6H  . ipratropium-albuterol  3 mL Nebulization BID  . lidocaine      . metoprolol tartrate  5 mg Intravenous Q6H  . midazolam      . pantoprazole (PROTONIX) IV  40 mg Intravenous Q12H  . sodium chloride flush  10-40 mL Intracatheter Q12H   Continuous Infusions: . TPN ADULT (  ION) 70 mL/hr at 08/31/19 1718     LOS: 9 days    Time spent: 35  minutes    Belkys A Regalado, MD [facility]   If 7PM-7AM, please contact night-coverage [email or web address]  08/31/2019, 6:10 PM

## 2019-08-31 NOTE — Progress Notes (Signed)
Renal Navigator contacted patient's OP HD clinic[facility] and spoke with Joy to update her on patient's statement yesterday of desire to stop dialysis. In order to support him and leave his options open while he discusses this enormous decision, Navigator and clinic staff agree to hold a seat time for him for HD at the clinic in the event that he changes his mind about this once he has the opportunity to process this with PMT and his family.  In the event that patient decides to continue with HD while healing from abdominal surgery, he has been given a MWF schedule with a seat time of 11:45am/11:30am arrival. Navigator will follow closely and update patient accordingly.  Alphonzo Cruise, [place] Renal Navigator [phone]

## 2019-08-31 NOTE — Plan of Care (Signed)
  Problem: Clinical Measurements: Goal: Will remain free from infection Outcome: Progressing Goal: Respiratory complications will improve Outcome: Progressing   Problem: Nutrition: Goal: Adequate nutrition will be maintained Outcome: Progressing   Problem: Pain Managment: Goal: General experience of comfort will improve Outcome: Progressing   

## 2019-08-31 NOTE — Procedures (Signed)
Interventional Radiology Procedure:   Indications: ESRD and needs tunneled dialysis catheter  Procedure: Tunneled HD catheter placement  Findings: Left IJ (28 cm tip to cuff) HD catheter.  Tip in right atrium.  Removal of right jugular Temp cath.  Complications: None     EBL: less than 10 ml  Plan: New HD catheter is ready to use.     Rafaela Dinius R. Anselm Pancoast, MD  Pager: [phone]

## 2019-08-31 NOTE — Progress Notes (Signed)
RN notified by CCMD of 7 beats of Vtach. Pt. Alert and stable. Resting in bed. No distress noted. VSS. On call for [facility] paged to make aware.

## 2019-08-31 NOTE — Progress Notes (Addendum)
ANTICOAGULATION CONSULT NOTE - Initial Consult  Pharmacy Consult for heparin Indication: atrial fibrillation  No Known Allergies  Patient Measurements: Height: 6\' 4"  (193 cm) Weight: 77.3 kg (170 lb 6.7 oz) IBW/kg (Calculated) : 86.8 Heparin Dosing Weight: 81.5kg  Vital Signs: Temp: 97.6 F (36.4 C) (05/19 1442) Temp Source: Oral (05/19 1442) BP: 135/101 (05/19 1516) Pulse Rate: 97 (05/19 1516)  Labs: Recent Labs    08/30/19 0500 08/30/19 0500 08/30/19 1040 08/30/19 1138 08/31/19 0500  HGB 11.7*   < > 12.3*  --  12.6*  HCT 37.3*  --  38.3*  --  39.4  PLT 242  --  257  --  250  CREATININE  --   --  6.84* 6.77* 5.19*   < > = values in this interval not displayed.    Estimated Creatinine Clearance: 15.9 mL/min (A) (by C-G formula based on SCr of 5.19 mg/dL (H)).  Assessment: 64 yo M starting on heparin for afib (CHADS2VASc = 4). Patient was on apixaban PTA but this was held for SBO, last dose given 5/13 at 10am. Expect that apixaban has been metabolized by now but given that patient is on dialysis, will check an aPTT to confirm correlation with HL. H/H, plt stable. Previously in Sept 2019, patient required up to 2300 units/hr of heparin with HL undetectable. Patient was on already on dialysis at the time. Given recent surgery and dialysis line placement, will start at a more conservative dose.  Goal of Therapy:  Heparin level 0.3-0.7 units/ml Monitor platelets by anticoagulation protocol: Yes   Plan:  Start heparin 1100 units/hr, no bolus F/u 8 hour aptt/HL  F/u aPTT until correlates with heparin level  Monitor aPTT, daily HL, CBC/plt Monitor for signs/symptoms of bleeding    Benetta Spar, PharmD, BCPS, BCCP Clinical Pharmacist  Please check AMION for all [place] phone numbers After 10:00 PM, call [place]

## 2019-08-31 NOTE — Progress Notes (Signed)
N/G tube Removed from Left Nares with a mucus plug. Patient tolerated fairly.

## 2019-08-31 NOTE — Plan of Care (Signed)
  Problem: Education: Goal: Knowledge of General Education information will improve Description: Including pain rating scale, medication(s)/side effects and non-pharmacologic comfort measures Outcome: Progressing   Problem: Health Behavior/Discharge Planning: Goal: Ability to manage health-related needs will improve Outcome: Progressing   Problem: Clinical Measurements: Goal: Ability to maintain clinical measurements within normal limits will improve Outcome: Progressing Goal: Will remain free from infection Outcome: Progressing Goal: Diagnostic test results will improve Outcome: Progressing Goal: Respiratory complications will improve Outcome: Progressing Goal: Cardiovascular complication will be avoided Outcome: Progressing   Problem: Activity: Goal: Risk for activity intolerance will decrease Outcome: Progressing   Problem: Nutrition: Goal: Adequate nutrition will be maintained Outcome: Progressing   Problem: Elimination: Goal: Will not experience complications related to bowel motility Outcome: Progressing   Problem: Pain Managment: Goal: General experience of comfort will improve Outcome: Progressing   Problem: Skin Integrity: Goal: Risk for impaired skin integrity will decrease Outcome: Progressing   Problem: Education: Goal: Ability to demonstrate management of disease process will improve Outcome: Progressing Goal: Ability to verbalize understanding of medication therapies will improve Outcome: Progressing   Problem: Activity: Goal: Capacity to carry out activities will improve Outcome: Progressing   Problem: Cardiac: Goal: Ability to achieve and maintain adequate cardiopulmonary perfusion will improve Outcome: Progressing

## 2019-08-31 NOTE — Progress Notes (Signed)
[facility] Progress Note  2 Days Post-Op  Subjective: Pain controlled. Reports passing gas 2-3x last night. No BM yet. States he got OOB yesterday but didn't work with therapy due to procedure/HD. States the wrong HD cath was inserted and has to be exchanged this morning.   States RN changed his NGT cannister early this AM but there was only a small amt in the cannister when it was changed.   Objective: Vital signs in last 24 hours: Temp:  [97.7 F (36.5 C)-98.5 F (36.9 C)] 98.2 F (36.8 C) (05/19 0404) Pulse Rate:  [59-123] 102 (05/19 0404) Resp:  [18-20] 18 (05/19 0404) BP: (71-149)/(34-101) 131/101 (05/19 0628) SpO2:  [93 %-98 %] 95 % (05/19 0404) Weight:  [76.7 kg-78.8 kg] 77.3 kg (05/19 0404) Last BM Date: 08/23/19  Intake/Output from previous day: 05/18 0701 - 05/19 0700 In: 1134.1 [P.O.:240; I.V.:694.1; IV Piggyback:200] Out: -  Intake/Output this shift: No intake/output data recorded.  PE: General: chronically ill appearing white male who is sitting in bed in NAD Heart: regular, rate, and rhythm.. No obvious murmurs, gallops, or rubs noted.  Palpable radial and pedal pulses bilaterally Lungs: CTAB.  Respiratory effort nonlabored Abd: soft, protuberant, appropriately tender around midline incision, mild distention (slightly improved compared to previous exam), midline incision with dressing in place - scant SS drainage on bandage. PD catheter palpable, previous laparotomy scar. NG- output not documented in epic overnight, there is 300 cc in the cannister currently.   Lab Results:  Recent Labs    08/30/19 1040 08/31/19 0500  WBC 12.9* 13.2*  HGB 12.3* 12.6*  HCT 38.3* 39.4  PLT 257 250   BMET Recent Labs    08/30/19 1138 08/31/19 0500  NA 138 137  K 3.7 3.6  CL 99 98  CO2 26 26  GLUCOSE 123* 92  BUN 55* 40*  CREATININE 6.77* 5.19*  CALCIUM 7.5* 7.6*   PT/INR No results for input(s): LABPROT, INR in the last 72 hours. CMP      Component Value Date/Time   NA 137 08/31/2019 0500   K 3.6 08/31/2019 0500   CL 98 08/31/2019 0500   CO2 26 08/31/2019 0500   GLUCOSE 92 08/31/2019 0500   BUN 40 (H) 08/31/2019 0500   CREATININE 5.19 (H) 08/31/2019 0500   CALCIUM 7.6 (L) 08/31/2019 0500   PROT 5.7 (L) 08/31/2019 0500   ALBUMIN 2.1 (L) 08/31/2019 0500   AST 16 08/31/2019 0500   ALT 6 08/31/2019 0500   ALKPHOS 71 08/31/2019 0500   BILITOT 0.8 08/31/2019 0500   GFRNONAA 11 (L) 08/31/2019 0500   GFRAA 13 (L) 08/31/2019 0500   Lipase     Component Value Date/Time   LIPASE 23 08/25/2019 1740       Studies/Results: IR Fluoro Guide CV Line Right  Result Date: 08/30/2019 INDICATION: 64 year old male with a history of chronic renal disease referred for hemodialysis catheter placement. EXAM: IMAGE GUIDED PLACEMENT OF TEMPORARY HEMODIALYSIS CATHETER MEDICATIONS: None ANESTHESIA/SEDATION: None FLUOROSCOPY TIME:  Fluoroscopy Time: 5 minutes 42 seconds (40 mGy). COMPLICATIONS: None PROCEDURE: Informed written consent was obtained from the patient after a discussion of the risks, benefits, and alternatives to treatment. Questions regarding the procedure were encouraged and answered. The right neck was prepped with chlorhexidine in a sterile fashion, and a sterile drape was applied covering the operative field. Maximum barrier sterile technique with sterile gowns and gloves were used for the procedure. A timeout was performed prior to the initiation of the procedure.  Ultrasound was performed demonstrating patent right internal jugular vein. A micropuncture kit was utilized to access the right internal jugular vein under direct, real-time ultrasound guidance after the overlying soft tissues were anesthetized with 1% lidocaine with epinephrine. Ultrasound image documentation was performed. With the initial needle placement in the supraclavicular region, the microwire would not pass centrally. Limited venogram was performed with  contrast. This demonstrated scarring and small caliber of the vein, with patency of the SVC. Needle was withdrawn. A second access site more cephalad was selected. 1% lidocaine was used for local anesthesia. Micropuncture needle was used to access the right internal jugular vein. The Nitrex microwire was navigated into a position in the low internal jugular vein. The microwire would not cross into the SVC. The micro puncture kit was advanced over the wire into the low IJ. The microwire and the inner dilator were removed. A standard Glidewire was then used to navigate into the subclavian vein. Angled catheter was then placed on the Glidewire, and using an angled catheter and the Glidewire we were able to successfully navigate the scarred region of the low internal jugular vein. The Glidewire was then used to make a measurement for the length of the catheter. A 20 cm temp cath was selected. Guidewire was advanced to the level of the right atrium. A 20 cm hemodialysis catheter was then placed over the wire. Final catheter positioning was confirmed and documented with a spot radiographic image. The catheter aspirates and flushes normally. The catheter was flushed with appropriate volume heparin dwells. Dressings were applied. The patient tolerated the procedure well without immediate post procedural complication. 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 A low puncture will not allowed navigation of the scar region. The current temporary hemodialysis catheter cannot be converted. Would advise alternative site if the patient needs a tunneled hemodialysis catheter. IMPRESSION: Status post image guided placement of temporary hemodialysis catheter. Signed, Dulcy Fanny. Dellia Nims, RPVI Vascular and Interventional Radiology Specialists [facility] Radiology Electronically Signed   By: Corrie Mckusick D.O.   On: 08/30/2019 11:26   IR US Guide Vasc Access Right  Result Date:  08/30/2019 INDICATION: 64 year old male with a history of chronic renal disease referred for hemodialysis catheter placement. EXAM: IMAGE GUIDED PLACEMENT OF TEMPORARY HEMODIALYSIS CATHETER MEDICATIONS: None ANESTHESIA/SEDATION: None FLUOROSCOPY TIME:  Fluoroscopy Time: 5 minutes 42 seconds (40 mGy). COMPLICATIONS: None PROCEDURE: Informed written consent was obtained from the patient after a discussion of the risks, benefits, and alternatives to treatment. Questions regarding the procedure were encouraged and answered. The right neck was prepped with chlorhexidine in a sterile fashion, and a sterile drape was applied covering the operative field. Maximum barrier sterile technique with sterile gowns and gloves were used for the procedure. A timeout was performed prior to the initiation of the procedure. Ultrasound was performed demonstrating patent right internal jugular vein. A micropuncture kit was utilized to access the right internal jugular vein under direct, real-time ultrasound guidance after the overlying soft tissues were anesthetized with 1% lidocaine with epinephrine. Ultrasound image documentation was performed. With the initial needle placement in the supraclavicular region, the microwire would not pass centrally. Limited venogram was performed with contrast. This demonstrated scarring and small caliber of the vein, with patency of the SVC. Needle was withdrawn. A second access site more cephalad was selected. 1% lidocaine was used for local anesthesia. Micropuncture needle was used to access the right internal jugular vein. The Nitrex microwire was navigated into a  position in the low internal jugular vein. The microwire would not cross into the SVC. The micro puncture kit was advanced over the wire into the low IJ. The microwire and the inner dilator were removed. A standard Glidewire was then used to navigate into the subclavian vein. Angled catheter was then placed on the Glidewire, and using an  angled catheter and the Glidewire we were able to successfully navigate the scarred region of the low internal jugular vein. The Glidewire was then used to make a measurement for the length of the catheter. A 20 cm temp cath was selected. Guidewire was advanced to the level of the right atrium. A 20 cm hemodialysis catheter was then placed over the wire. Final catheter positioning was confirmed and documented with a spot radiographic image. The catheter aspirates and flushes normally. The catheter was flushed with appropriate volume heparin dwells. Dressings were applied. The patient tolerated the procedure well without immediate post procedural complication. 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 A low puncture will not allowed navigation of the scar region. The current temporary hemodialysis catheter cannot be converted. Would advise alternative site if the patient needs a tunneled hemodialysis catheter. IMPRESSION: Status post image guided placement of temporary hemodialysis catheter. Signed, Dulcy Fanny. Dellia Nims, RPVI Vascular and Interventional Radiology Specialists [facility] Radiology Electronically Signed   By: Corrie Mckusick D.O.   On: 08/30/2019 11:26   DG CHEST PORT 1 VIEW  Result Date: 08/29/2019 CLINICAL DATA:  PICC placement EXAM: PORTABLE CHEST 1 VIEW COMPARISON:  Radiograph 08/29/2019 FINDINGS: Transesophageal tube tip and side port below the GE junction, beyond the margins of imaging. A left upper extremity PICC tip terminates at the superior cavoatrial junction. Telemetry leads overlie the chest. Redemonstration of the patchy right basilar opacity with a background of more chronic coarsened interstitial change in apical lucency likely reflecting emphysematous features prior cross-sectional imaging. No convincing features of edema. No pneumothorax or visible effusion of the left costophrenic sulcus is partially collimated. The aorta is calcified. The  remaining cardiomediastinal contours are unremarkable. No acute osseous or soft tissue abnormality. Degenerative changes are present in the imaged spine and shoulders. IMPRESSION: 1. Left upper extremity PICC tip terminates at the superior cavoatrial junction. 2. Transesophageal tube tip and side port below the GE junction, beyond the margins of imaging. 3. Unchanged patchy right basilar opacity. 4. Coarsened interstitial changes and chronic apical lucency compatible with patient's known COPD. Electronically Signed   By: Lovena Le M.D.   On: 08/29/2019 23:02   DG Chest Port 1 View  Result Date: 08/29/2019 CLINICAL DATA:  64 year old male with respiratory failure. EXAM: PORTABLE CHEST 1 VIEW COMPARISON:  Portable chest 08/25/2019 and earlier. FINDINGS: Portable AP semi upright view at 1723 hours. Enteric tube placed, courses to the left upper quadrant with tip not included. No endotracheal tube identified. Mediastinal contours are stable and within normal limits. Evidence of upper lung emphysema on a prior cervical spine CT 01/31/2018, and continued attenuation of upper lobe bronchovascular markings. Minimal patchy right lung base opacity not significantly changed since 08/22/2019. No pneumothorax, pulmonary edema or pleural effusion identified. IMPRESSION: 1. Enteric tube placed into the stomach with tip not included. 2. Emphysema with minimal right lung base opacity more resembling atelectasis than infection. Electronically Signed   By: Genevie Ann M.D.   On: 08/29/2019 17:34   DG Abd Portable 1V  Result Date: 08/29/2019 CLINICAL DATA:  Small bowel obstruction. EXAM: PORTABLE ABDOMEN -  1 VIEW COMPARISON:  Aug 27, 2019. FINDINGS: Distal tip of nasogastric tube is seen in expected position of distal stomach. Stable small bowel dilatation is noted concerning for distal small bowel obstruction. IMPRESSION: Distal tip of nasogastric tube is seen in expected position of distal stomach. Electronically Signed   By:  Marijo Conception M.D.   On: 08/29/2019 08:50   [place] EKG SITE RITE  Result Date: 08/29/2019 If Site Rite image not attached, placement could not be confirmed due to current cardiac rhythm.   Anti-infectives: Anti-infectives (From admission, onward)   Start     Dose/Rate Route Frequency Ordered Stop   08/29/19 1145  ceFAZolin (ANCEF) IVPB 2g/100 mL premix     2 g 200 mL/hr over 30 Minutes Intravenous To [facility] 08/29/19 1127 08/29/19 1445   08/23/19 2200  azithromycin (ZITHROMAX) tablet 500 mg  Status:  Discontinued    Note to Pharmacy: Pharmacy to adjust timing for next dose.   500 mg Oral Daily at bedtime 08/23/19 0233 08/25/19 2046   08/22/19 2130  azithromycin (ZITHROMAX) 500 mg in sodium chloride 0.9 % 250 mL IVPB     500 mg 250 mL/hr over 60 Minutes Intravenous  Once 08/22/19 2122 08/22/19 2245       Assessment/Plan CHF COPD A fib  Polycystic kidney disease, ESRD, on PD -  converted to HD temporarily H/O MI - plavix on hold as of 5/13 CAD Hep C HLD  SBO POD#2 exploratory laparotomy, LOA, repair of SB serosal injury x3, resection of mesenteric nodule 08/29/19 Dr. Bobbye Morton - now having some flatus, no BM - NGT 300-500 cc/24h, clamp trial this morning, potentially D/C NG tube later today if tolerates - continue TPN - follow path - OOB/mobilize, PT/OT - IS 10x q 1h   FEN - clamp NGT, TPN VTE - SCDs, ok from surgical perspective to start DVT PPx or hep gtt given history of a.fib  ID -none currently    LOS: 9 days     Jill Alexanders , [facility] 08/31/2019, 8:17 AM Please see Amion for pager number during day hours 7:00am-4:30pm

## 2019-08-31 NOTE — Progress Notes (Addendum)
  PHARMACY - TOTAL PARENTERAL NUTRITION CONSULT NOTE   Indication: Small bowel obstruction since 08/25/19  Patient Measurements: Height: 6\' 4"  (193 cm) Weight: 77.3 kg (170 lb 6.7 oz) IBW/kg (Calculated) : 86.8 TPN AdjBW (KG): 81.5 Body mass index is 20.74 kg/m. Usual Weight: 180 lbs  Assessment:  64 yo M admitted for COPD exacerbation and afib who developed SBO on 08/25/19. Last PO intake was 5/12 so patient is at moderate risk for refeeding syndrome. Weight has been stable at 180-200 lbs in the last 5 years with no weight loss until this admission, confirmed with patient.   Glucose / Insulin: BG <150 since stopping methylpredisolone. A1C 5.9, will change SSI to q6hr Electrolytes: Labs -  K 3.6, Mg 1.8, Phos 3.7  Renal: ESRD on PD, switch to HD per Gen Surg due to suboptimal dialysis on PD and abdominal surgery LFTs / TGs: Wnl, TGs 63  Prealbumin / albumin: Albumin 2.1  Intake / Output; MIVF: NGT output 300 ml, still makes urine 300-668ml/d although non recorded 5/18 GI Imaging: 5/13 CT concerning for possible small bowel obstruction Surgeries / Procedures:  5/17 ex lap - lysis of adhesions, small bowel serosal repair x3, resection of mesenteric nodule  Central access: PICC line 08/29/19 TPN start date: 08/29/19  Nutritional Goals (RD recommendation 5/17): Kcal:  6295-2841; Protein:  125-140 grams Fluid: minimize for ESRD Goal TPN rate is 70 mL/hr (provides 126g of protein, 302g dextrose, 59g lipids and 2120 kcals per day)  Current Nutrition:  NPO  Plan:  Advance TPN to 53mL/hr at 1800. TPN today provides: AA 126g, Dextrose 302g, 59g of lipids and 2120 Kcal). Using Clinisol 15% for ESRD patient.  Electrolytes in TPN: 30mEq/L of Na, 5 meq/L K, no Ca, 5 meq/L Mag and no Phos.  Max Cl  Add standard MVI and trace elements but remove chromium (due to ESRD) Chang very Sensitive SSI to q6hr and adjust as needed  Monitor TPN labs on Mon/Thurs   Alanda Slim, PharmD, [place] Clinical  Pharmacist Please see AMION for all Pharmacists' Contact Phone Numbers 08/31/2019, 7:32 AM

## 2019-08-31 NOTE — Progress Notes (Signed)
Physical Therapy Treatment Patient Details Name: Aaron [name] MRN: [id number] DOB: [birth year] Today's Date: 08/31/2019    History of Present Illness Pt is a 64 y/o male admitted secondary to worsening SOB thought to be from COPD exacerbation. Pt also with a fib and RVR. PMH includes HTN, COPD, ESRD on PD, CHF, and a fib.     PT Comments    Pt reluctant to get OOB after surgery, but surprised himself.  Emphasis on transition to EOB, sit to stand and progressing gait with cues for best posture.  Sats maintained in mid 90's, but HR rose to 146 bpm.  Pt showed fatigue, but unaware that he was tachy.    Follow Up Recommendations  Home health PT     Equipment Recommendations  None recommended by PT    Recommendations for Other Services       Precautions / Restrictions Precautions Precautions: Fall Precaution Comments: NG tube    Mobility  Bed Mobility Overal bed mobility: Needs Assistance Bed Mobility: Supine to Sit     Supine to sit: Supervision        Transfers Overall transfer level: Needs assistance   Transfers: Sit to/from Stand Sit to Stand: Supervision            Ambulation/Gait Ambulation/Gait assistance: Min guard Gait Distance (Feet): 105 Feet(x2) Assistive device: Rolling walker (2 wheeled) Gait Pattern/deviations: Step-through pattern Gait velocity: Decreased   General Gait Details: heavy use of the RW with flexed posture.  When cued, pt could attain submaximal upright posture, but unable to hold during gait due to abdominal pain.  Pt's vitals difficult to check with pulse ox, but finally got 95% for SpO2 and noisy HR.  RN called out that EHR rose to 146 bpm.  pt was unaware that he was tachy.   Stairs             Wheelchair Mobility    Modified Rankin (Stroke Patients Only)       Balance Overall balance assessment: Needs assistance   Sitting balance-Leahy Scale: Good       Standing balance-Leahy Scale: Fair Standing balance  comment: Reliant on BUE support during gait                            Cognition Arousal/Alertness: Awake/alert Behavior During Therapy: WFL for tasks assessed/performed Overall Cognitive Status: Within Functional Limits for tasks assessed                                        Exercises      General Comments        Pertinent Vitals/Pain Pain Assessment: 0-10 Pain Score: 5  Pain Location: abdomen  Pain Descriptors / Indicators: Grimacing Pain Intervention(s): Monitored during session    Home Living                      Prior Function            PT Goals (current goals can now be found in the care plan section) Acute Rehab PT Goals Patient Stated Goal: to get stronger PT Goal Formulation: With patient Time For Goal Achievement: 09/07/19 Potential to Achieve Goals: Good    Frequency    Min 3X/week      PT Plan Current plan remains appropriate    Co-evaluation  AM-PAC PT "6 Clicks" Mobility   Outcome Measure  Help needed turning from your back to your side while in a flat bed without using bedrails?: None Help needed moving from lying on your back to sitting on the side of a flat bed without using bedrails?: None Help needed moving to and from a bed to a chair (including a wheelchair)?: None Help needed standing up from a chair using your arms (e.g., wheelchair or bedside chair)?: None Help needed to walk in hospital room?: A Little Help needed climbing 3-5 steps with a railing? : A Little 6 Click Score: 22    End of Session   Activity Tolerance: Patient tolerated treatment well;Patient limited by fatigue Patient left: in chair;with call bell/phone within reach;with chair alarm set Nurse Communication: Mobility status PT Visit Diagnosis: Other abnormalities of gait and mobility (R26.89);Muscle weakness (generalized) (M62.81);Difficulty in walking, not elsewhere classified (R26.2)     Time:  4627-0350 PT Time Calculation (min) (ACUTE ONLY): 21 min  Charges:  $Gait Training: 8-22 mins                     08/31/2019  Ginger Carne., PT Acute Rehabilitation Services [phone]  (pager) [phone]  (office)   Tessie Fass Dannya Pitkin 08/31/2019, 5:52 PM

## 2019-09-01 DIAGNOSIS — K56609 Unspecified intestinal obstruction, unspecified as to partial versus complete obstruction: Secondary | ICD-10-CM

## 2019-09-01 DIAGNOSIS — N186 End stage renal disease: Secondary | ICD-10-CM

## 2019-09-01 DIAGNOSIS — I4891 Unspecified atrial fibrillation: Secondary | ICD-10-CM

## 2019-09-01 DIAGNOSIS — Z992 Dependence on renal dialysis: Secondary | ICD-10-CM

## 2019-09-01 DIAGNOSIS — Z66 Do not resuscitate: Secondary | ICD-10-CM

## 2019-09-01 DIAGNOSIS — J441 Chronic obstructive pulmonary disease with (acute) exacerbation: Principal | ICD-10-CM

## 2019-09-01 LAB — GLUCOSE, CAPILLARY
Glucose-Capillary: 123 mg/dL — ABNORMAL HIGH (ref 70–99)
Glucose-Capillary: 131 mg/dL — ABNORMAL HIGH (ref 70–99)
Glucose-Capillary: 96 mg/dL (ref 70–99)

## 2019-09-01 LAB — CBC
HCT: 37.4 % — ABNORMAL LOW (ref 39.0–52.0)
Hemoglobin: 11.8 g/dL — ABNORMAL LOW (ref 13.0–17.0)
MCH: 31.2 pg (ref 26.0–34.0)
MCHC: 31.6 g/dL (ref 30.0–36.0)
MCV: 98.9 fL (ref 80.0–100.0)
Platelets: 203 10*3/uL (ref 150–400)
RBC: 3.78 MIL/uL — ABNORMAL LOW (ref 4.22–5.81)
RDW: 14.4 % (ref 11.5–15.5)
WBC: 10.5 10*3/uL (ref 4.0–10.5)
nRBC: 0 % (ref 0.0–0.2)

## 2019-09-01 LAB — COMPREHENSIVE METABOLIC PANEL
ALT: <5 U/L (ref 0–44)
AST: 13 U/L — ABNORMAL LOW (ref 15–41)
Albumin: 1.9 g/dL — ABNORMAL LOW (ref 3.5–5.0)
Alkaline Phosphatase: 59 U/L (ref 38–126)
Anion gap: 11 (ref 5–15)
BUN: 65 mg/dL — ABNORMAL HIGH (ref 8–23)
CO2: 25 mmol/L (ref 22–32)
Calcium: 7.6 mg/dL — ABNORMAL LOW (ref 8.9–10.3)
Chloride: 99 mmol/L (ref 98–111)
Creatinine, Ser: 5.98 mg/dL — ABNORMAL HIGH (ref 0.61–1.24)
GFR calc Af Amer: 11 mL/min — ABNORMAL LOW (ref >60)
GFR calc non Af Amer: 9 mL/min — ABNORMAL LOW (ref >60)
Glucose, Bld: 84 mg/dL (ref 70–99)
Potassium: 3.6 mmol/L (ref 3.5–5.1)
Sodium: 135 mmol/L (ref 135–145)
Total Bilirubin: 0.4 mg/dL (ref 0.3–1.2)
Total Protein: 5.1 g/dL — ABNORMAL LOW (ref 6.5–8.1)

## 2019-09-01 LAB — MAGNESIUM: Magnesium: 1.8 mg/dL (ref 1.7–2.4)

## 2019-09-01 LAB — PHOSPHORUS: Phosphorus: 3.3 mg/dL (ref 2.5–4.6)

## 2019-09-01 LAB — APTT
aPTT: 36 seconds (ref 24–36)
aPTT: 96 seconds — ABNORMAL HIGH (ref 24–36)

## 2019-09-01 LAB — HEPARIN LEVEL (UNFRACTIONATED)
Heparin Unfractionated: 0.17 IU/mL — ABNORMAL LOW (ref 0.30–0.70)
Heparin Unfractionated: 0.75 IU/mL — ABNORMAL HIGH (ref 0.30–0.70)

## 2019-09-01 MED ORDER — HEPARIN SODIUM (PORCINE) 1000 UNIT/ML IJ SOLN
INTRAMUSCULAR | Status: AC
  Administered 2019-09-01: 4200 [IU]
  Filled 2019-09-01: qty 5

## 2019-09-01 MED ORDER — TRACE MINERALS CU-MN-SE-ZN 300-55-60-3000 MCG/ML IV SOLN
INTRAVENOUS | Status: AC
  Filled 2019-09-01: qty 672

## 2019-09-01 MED ORDER — NEPRO/CARBSTEADY PO LIQD
237.0000 mL | Freq: Two times a day (BID) | ORAL | Status: DC
  Administered 2019-09-02 – 2019-09-03 (×3): 237 mL via ORAL

## 2019-09-01 NOTE — Progress Notes (Addendum)
[facility] Progress Note  3 Days Post-Op  Subjective: NAEO. +flatus. No BM. Tolerating FLD. On hep gtt Seen in HD Objective: Vital signs in last 24 hours: Temp:  [97.5 F (36.4 C)-98.4 F (36.9 C)] 97.9 F (36.6 C) (05/20 1126) Pulse Rate:  [46-119] 66 (05/20 1126) Resp:  [11-20] 18 (05/20 1126) BP: (96-152)/(56-113) 145/104 (05/20 1126) SpO2:  [91 %-100 %] 97 % (05/20 1126) Weight:  [78.3 kg-78.9 kg] 78.9 kg (05/20 0700) Last BM Date: 08/23/19  Intake/Output from previous day: 05/19 0701 - 05/20 0700 In: 2214 [P.O.:920; I.V.:1194; IV Piggyback:100] Out: 550 [Urine:200; Emesis/NG output:350] Intake/Output this shift: Total I/O In: -  Out: 31 [Other:1900]  PE: General: chronically ill appearing white male who is sitting in bed in NAD Heart: regular, rate, and rhythm.. No obvious murmurs, gallops, or rubs noted.  Palpable radial and pedal pulses bilaterally Lungs: CTAB.  Respiratory effort nonlabored Abd: soft, protuberant, appropriately tender around midline incision, dressing removes - small amt blanching erythema right aspect of incision bandage. PD catheter palpable, previous laparotomy scar. NG- output not documented in epic overnight, there is 300 cc in the cannister currently.   Lab Results:  Recent Labs    08/31/19 0500 09/01/19 0625  WBC 13.2* 10.5  HGB 12.6* 11.8*  HCT 39.4 37.4*  PLT 250 203   BMET Recent Labs    08/31/19 0500 09/01/19 0625  NA 137 135  K 3.6 3.6  CL 98 99  CO2 26 25  GLUCOSE 92 84  BUN 40* 65*  CREATININE 5.19* 5.98*  CALCIUM 7.6* 7.6*   PT/INR No results for input(s): LABPROT, INR in the last 72 hours. CMP     Component Value Date/Time   NA 135 09/01/2019 0625   K 3.6 09/01/2019 0625   CL 99 09/01/2019 0625   CO2 25 09/01/2019 0625   GLUCOSE 84 09/01/2019 0625   BUN 65 (H) 09/01/2019 0625   CREATININE 5.98 (H) 09/01/2019 0625   CALCIUM 7.6 (L) 09/01/2019 0625   PROT 5.1 (L) 09/01/2019 0625   ALBUMIN  1.9 (L) 09/01/2019 0625   AST 13 (L) 09/01/2019 0625   ALT <5 09/01/2019 0625   ALKPHOS 59 09/01/2019 0625   BILITOT 0.4 09/01/2019 0625   GFRNONAA 9 (L) 09/01/2019 0625   GFRAA 11 (L) 09/01/2019 0625   Lipase     Component Value Date/Time   LIPASE 23 08/25/2019 1740       Studies/Results: IR Fluoro Guide CV Line Left  Result Date: 08/31/2019 INDICATION: 64 year old with end-stage renal disease. Patient currently has a non tunneled right jugular catheter and needs a new tunneled dialysis catheter. The right jugular catheter was difficult to place and plan for placement of a left jugular tunneled dialysis catheter. EXAM: FLUOROSCOPIC AND ULTRASOUND GUIDED PLACEMENT OF A TUNNELED DIALYSIS CATHETER Physician: Stephan Minister. Anselm Pancoast, MD MEDICATIONS: Ancef 2 g; The antibiotic was administered within an appropriate time interval prior to skin puncture. ANESTHESIA/SEDATION: Versed 2.0 mg IV; Fentanyl 100 mcg IV; Moderate Sedation Time:  16 minutes The patient was continuously monitored during the procedure by the interventional radiology nurse under my direct supervision. FLUOROSCOPY TIME:  Fluoroscopy Time: 30 seconds, 5.3 mGy COMPLICATIONS: None immediate. PROCEDURE: The procedure was explained to the patient. The risks and benefits of the procedure were discussed and the patient's questions were addressed. Informed consent was obtained from the patient. The patient was placed supine on the interventional table. Ultrasound confirmed a patent left internal jugular vein. Ultrasound images were  obtained for documentation. The left neck and chest was prepped and draped in a sterile fashion. The left neck was anesthetized with 1% lidocaine. Maximal barrier sterile technique was utilized including caps, mask, sterile gowns, sterile gloves, sterile drape, hand hygiene and skin antiseptic. A small incision was made with #11 blade scalpel. A 21 gauge needle directed into the left internal jugular vein with ultrasound  guidance. A micropuncture dilator set was placed. A 28 cm tip to cuff Palindrome catheter was selected. The skin below the left clavicle was anesthetized and a small incision was made with an #11 blade scalpel. A subcutaneous tunnel was formed to the vein dermatotomy site. The catheter was brought through the tunnel. The vein dermatotomy site was dilated to accommodate a peel-away sheath. The catheter was placed through the peel-away sheath and directed into the central venous structures. The tip of the catheter was placed in the right atrium with fluoroscopy. Fluoroscopic images were obtained for documentation. Both lumens were found to aspirate and flush well. The proper amount of heparin was flushed in both lumens. The vein dermatotomy site was closed using a single layer of absorbable suture and Dermabond. Gel-Foam was placed in the subcutaneous tunnel. The catheter was secured to the skin using Prolene suture. Right jugular catheter was removed at the end of the procedure. IMPRESSION: Successful placement of a left jugular tunneled dialysis catheter using ultrasound and fluoroscopic guidance. Electronically Signed   By: Markus Daft M.D.   On: 08/31/2019 15:29   IR US Guide Vasc Access Left  Result Date: 08/31/2019 INDICATION: 64 year old with end-stage renal disease. Patient currently has a non tunneled right jugular catheter and needs a new tunneled dialysis catheter. The right jugular catheter was difficult to place and plan for placement of a left jugular tunneled dialysis catheter. EXAM: FLUOROSCOPIC AND ULTRASOUND GUIDED PLACEMENT OF A TUNNELED DIALYSIS CATHETER Physician: Stephan Minister. Anselm Pancoast, MD MEDICATIONS: Ancef 2 g; The antibiotic was administered within an appropriate time interval prior to skin puncture. ANESTHESIA/SEDATION: Versed 2.0 mg IV; Fentanyl 100 mcg IV; Moderate Sedation Time:  16 minutes The patient was continuously monitored during the procedure by the interventional radiology nurse under my  direct supervision. FLUOROSCOPY TIME:  Fluoroscopy Time: 30 seconds, 5.3 mGy COMPLICATIONS: None immediate. PROCEDURE: The procedure was explained to the patient. The risks and benefits of the procedure were discussed and the patient's questions were addressed. Informed consent was obtained from the patient. The patient was placed supine on the interventional table. Ultrasound confirmed a patent left internal jugular vein. Ultrasound images were obtained for documentation. The left neck and chest was prepped and draped in a sterile fashion. The left neck was anesthetized with 1% lidocaine. Maximal barrier sterile technique was utilized including caps, mask, sterile gowns, sterile gloves, sterile drape, hand hygiene and skin antiseptic. A small incision was made with #11 blade scalpel. A 21 gauge needle directed into the left internal jugular vein with ultrasound guidance. A micropuncture dilator set was placed. A 28 cm tip to cuff Palindrome catheter was selected. The skin below the left clavicle was anesthetized and a small incision was made with an #11 blade scalpel. A subcutaneous tunnel was formed to the vein dermatotomy site. The catheter was brought through the tunnel. The vein dermatotomy site was dilated to accommodate a peel-away sheath. The catheter was placed through the peel-away sheath and directed into the central venous structures. The tip of the catheter was placed in the right atrium with fluoroscopy. Fluoroscopic images were obtained for  documentation. Both lumens were found to aspirate and flush well. The proper amount of heparin was flushed in both lumens. The vein dermatotomy site was closed using a single layer of absorbable suture and Dermabond. Gel-Foam was placed in the subcutaneous tunnel. The catheter was secured to the skin using Prolene suture. Right jugular catheter was removed at the end of the procedure. IMPRESSION: Successful placement of a left jugular tunneled dialysis catheter  using ultrasound and fluoroscopic guidance. Electronically Signed   By: Markus Daft M.D.   On: 08/31/2019 15:29    Anti-infectives: Anti-infectives (From admission, onward)   Start     Dose/Rate Route Frequency Ordered Stop   08/31/19 1415  ceFAZolin (ANCEF) IVPB 2g/100 mL premix     2 g 200 mL/hr over 30 Minutes Intravenous  Once 08/31/19 1328 08/31/19 1358   08/31/19 1400  ceFAZolin (ANCEF) powder 2 g  Status:  Discontinued     2 g Other To Surgery 08/31/19 1314 08/31/19 1326   08/29/19 1145  ceFAZolin (ANCEF) IVPB 2g/100 mL premix     2 g 200 mL/hr over 30 Minutes Intravenous To [facility] 08/29/19 1127 08/29/19 1445   08/23/19 2200  azithromycin (ZITHROMAX) tablet 500 mg  Status:  Discontinued    Note to Pharmacy: Pharmacy to adjust timing for next dose.   500 mg Oral Daily at bedtime 08/23/19 0233 08/25/19 2046   08/22/19 2130  azithromycin (ZITHROMAX) 500 mg in sodium chloride 0.9 % 250 mL IVPB     500 mg 250 mL/hr over 60 Minutes Intravenous  Once 08/22/19 2122 08/22/19 2245       Assessment/Plan CHF COPD A fib  Polycystic kidney disease, ESRD, on PD -  converted to HD temporarily H/O MI - plavix on hold as of 5/13 CAD Hep C HLD  SBO POD#3 exploratory laparotomy, LOA, repair of SB serosal injury x3, resection of mesenteric nodule 08/29/19 Dr. Bobbye Morton - AFVSS, WBC 10 - now having some flatus, no BM - advance to SOFT diet, wean TPN - surgical path of mesenteric Bx complete but I cannot see in epic, will discuss with MD. Results may have been sent to her. - monitor small amt peri-incisional erythema.  - OOB/mobilize, PT/OT - IS 10x q 1h   FEN - SOFT diet. Wean TPN VTE - SCDs, hep gtt  ID -none currently    LOS: 10 days     Jill Alexanders , [facility], [facility] 09/01/2019, 11:37 AM Please see Amion for pager number during day hours 7:00am-4:30pm

## 2019-09-01 NOTE — Progress Notes (Signed)
Right PD cath site(which was placed at [facility] November 2020) clean, dry, intact. Changed per order with CHG wipe, Gentamicin cream applied with gauze, abd pad, and paper tape.

## 2019-09-01 NOTE — Progress Notes (Signed)
ANTICOAGULATION CONSULT NOTE  Pharmacy Consult for heparin Indication: atrial fibrillation  No Known Allergies  Patient Measurements: Height: 6\' 4"  (193 cm) Weight: 78.9 kg (173 lb 15.1 oz) IBW/kg (Calculated) : 86.8 Heparin Dosing Weight: 81.5 kg  Vital Signs: Temp: 97.9 F (36.6 C) (05/20 1126) Temp Source: Oral (05/20 1126) BP: 145/104 (05/20 1126) Pulse Rate: 66 (05/20 1126)  Labs: Recent Labs    08/30/19 1040 08/30/19 1040 08/30/19 1138 08/31/19 0500 09/01/19 0625 09/01/19 0626 09/01/19 1549  HGB 12.3*   < >  --  12.6* 11.8*  --   --   HCT 38.3*  --   --  39.4 37.4*  --   --   PLT 257  --   --  250 203  --   --   APTT  --   --   --   --   --  36 96*  HEPARINUNFRC  --   --   --   --   --  0.17* 0.75*  CREATININE 6.84*   < > 6.77* 5.19* 5.98*  --   --    < > = values in this interval not displayed.    Estimated Creatinine Clearance: 14.1 mL/min (A) (by C-G formula based on SCr of 5.98 mg/dL (H)).  Assessment: 64 yo M continuing on heparin for afib (CHADS2VASc = 4). Patient was on apixaban PTA, but this was held for SBO, last dose given 5/13 at 10am. Expect that apixaban has been metabolized by now, but given that patient is on dialysis, will check an aPTT to confirm correlation with heparin level. Previously in Sept 2019, patient required up to 2300 units/hr of heparin with heparin level undetectable. Patient was already on dialysis at the time.   Heparin level slightly supratherapeutic at 0.75, aPTT on high-end of therapeutic at 96. CBC stable. No s/sx of bleeding reported.   Goal of Therapy:  Heparin level 0.3-0.7 units/ml aPTT 66-102 seconds Monitor platelets by anticoagulation protocol: Yes   Plan:  Continue heparin at 1350 units/hr Recheck heparin level/aPTT with AM labs Monitor heparin level and aPTT until correlating, CBC, s/sx bleeding   Arturo Morton, PharmD, BCPS Please check AMION for all [facility] contact numbers Clinical  Pharmacist 09/01/2019 4:37 PM

## 2019-09-01 NOTE — TOC Progression Note (Signed)
Transition of Care [facility]) - Progression Note    Patient Details  Name: Aaron [name] MRN: [id number] Date of Birth: [birth year]  Transition of Care [facility]) CM/SW Contact  Zenon Mayo, RN Phone Number: 09/01/2019, 4:44 PM  Clinical Narrative:    NCM made referral to [facility] with [facility] for palliative services. She states her NP will be able to see patient on Monday.         Expected Discharge Plan and Services                                                 Social Determinants of Health (SDOH) Interventions    Readmission Risk Interventions Readmission Risk Prevention Plan 12/08/2017  Transportation Screening Complete  Medication Review Press photographer) Complete  HRI or Home Care Consult Complete  Some recent data might be hidden

## 2019-09-01 NOTE — Progress Notes (Signed)
Palliative Medicine RN Note: Rec'd a message from PMT PA [facility] requesting help setting up home Palliative Care through [facility]. Information printed and faxed over; called in referral to their office.  Marjie Skiff Taim Wurm, RN, BSN, [facility] Palliative Medicine Team 09/01/2019 10:03 AM Office [phone]

## 2019-09-01 NOTE — Consult Note (Signed)
Consultation Note Date: 09/01/2019   Patient Name: Aaron [name]  DOB: [birth year]  MRN: [id number]  Age / Sex: 64 y.o., male  PCP: [place] [place] Referring Physician: Elmarie Shiley, MD  Reason for Consultation: Establishing goals of care and Psychosocial/spiritual support  HPI/Patient Profile: 64 y.o. male  with past medical history of afib, COPD, PAD, HFpEF, ESRD on peritoneal dialysis, and hep C, who was admitted on 08/22/2019 with shortness of breath thought to be a COPD exacerbation.  In the 5/12 - 5/13 time frame the patient developed bilious vomiting and imaging showed a PSBO.  Conservative management was tried but did not result in improvement.  Patient underwent surgery on 5/17.   Patient has been changed to hemodialysis for approximately two months post op.  Afterward it is anticipated that he can return to PD.  Mr. Baldinger was able to walk 14' with a RW.  Today he is tolerating fluids PO.  PMT was called as the patient expressed that he did not want to continue HD.  Clinical Assessment and Goals of Care:  I have reviewed medical records including EPIC notes, labs and imaging, received report from the care team, examined the patient and talked with him at bedside  to discuss diagnosis prognosis, GOC, EOL wishes, disposition and options.  After speaking with Mr. Viviano I called his daughter Aaron [name] and spoke with her on the phone.  I introduced Palliative Medicine as specialized medical care for people living with serious illness. It focuses on providing relief from the symptoms and stress of a serious illness.   We discussed a brief life review of the patient. He has always been a very generous man - focused on helping others.  We chatted about his tattoos and how much he enjoyed riding his [employer].  He is religious and of the [place].  As far as  functional and nutritional status after surgery for SBO - he is now tolerating full liquids and will hopefully continue to progress.  He was able to get up and walk 105' with PT.  His daughter Aaron [name] expresses concern that he is very weak and lives alone.  She states that he has an aid that comes 1 hour a day - but is not terribly helpful.  We talked about having Home Heath services sent to his house.  We discussed his current illness and what it means in the larger context of his on-going co-morbidities.  Natural disease trajectory and expectations at EOL were discussed.  Mr. Kimberlin does not want to be a burden on his family.  He does not want to be dependent on others for personal care, feeding, etc..  He stated that if he is unable to walk, feed himself, and perform his own ADLs he just wants to be kept comfortable.  Mr. Gregori states that he has been thru so much in the last several months he is becoming exhausted.  He reiterates he does not want to be a burden.  For now he  will continue with HD and give himself time to think - however in the future he may make the decision to discontinue dialysis.  If he discontinues dialysis it is a priority to him to be kept comfortable.   We discussed that the way to do this is to involve Hospice the same day that he stops dialysis.  Hospice is in the best insurance plan for dying comfortably.  Advanced directives, concepts specific to code status, artifical feeding and hydration, and rehospitalization were considered and discussed.  He is a DNR and does not want to be resuscitated if he arrests.  Hospice and Palliative Care services outpatient were explained and offered.  Patient and daughter request Palliative care thru [place].  Questions and concerns were addressed.  The family was encouraged to call with questions or concerns.   Primary Decision Maker:  PATIENT    SUMMARY OF RECOMMENDATIONS    1.  Patient is DNR 2.  If he can not walk, feed  himself or perform ADLs he wants to be kept comfortable and allowed to die with dignity. 3.  Requests that he be followed outpatient by Palliative care thru [facility] 4.  Daughter requests [place] be sent to his house (PT/OT/RN/Aid/SW)  Code Status/Advance Care Planning:  DNR  Symptom Management:   Comfortable.    Additional Recommendations (Limitations, Scope, Preferences):  Full Scope Treatment  Palliative Prophylaxis:   Frequent Pain Assessment  Psycho-social/Spiritual:   Desire for further Chaplaincy support:  Yes.  Holiness faith.  Prognosis:     Unable to determine.  Less than 2 weeks if he stops dialysis.  Less than 1 year would not be surprising even if he continues dialysis.  Discharge Planning: Home with Home Health and Palliative.      Primary Diagnoses: Present on Admission: . Exertional dyspnea . COPD with acute exacerbation ([place]) . Atrial fibrillation with RVR ([place]) . Essential hypertension   I have reviewed the medical record, interviewed the patient and family, and examined the patient. The following aspects are pertinent.  Past Medical History:  Diagnosis Date  . Anxiety   . Arthritis    knees , back , shoulders (10/12/2017)  . Atrial fibrillation ([place])   . CAD (coronary artery disease)    Mild nonobstructive disease at cardiac catheterization 2002  . Chronic back pain    "back of my neck; down thru my legs" (10/12/2017)  . COPD (chronic obstructive pulmonary disease) ([place])   . Diverticulitis   . Esophageal reflux   . ESRD (end stage renal disease) on dialysis [facility])    "TTS; [place]" (11/23/2017)  . Essential hypertension   . Hepatitis C    states he no longer has this  . History of kidney stones   . History of syncope   . Hyperlipidemia   . Jerking 09/23/2014  . Myocardial infarction ([place]) 02/2017   "light one" (10/12/2017)  . Non-compliant behavior    non compliant with diaylsis per daughter  . PAT (paroxysmal  atrial tachycardia) ([place])   . Peripheral arterial disease ([place])    Occluded left superficial femoral artery status post stent June 2016 - Dr. Trula Slade  . Pneumonia 1961  . Polycystic kidney, unspecified type   . Syncope 09/2014  . SYNCOPE 05/07/2010   Qualifier: Diagnosis of  By: Laurance Flatten RN, BSN, [place]     Social History   Socioeconomic History  . Marital status: Legally Separated    Spouse name: Not on file  .  Number of children: 2  . Years of education: Not on file  . Highest education level: Not on file  Occupational History  . Occupation: DISABLED  Tobacco Use  . Smoking status: Current Every Day Smoker    Packs/day: 0.25    Years: 47.00    Pack years: 11.75    Types: Cigarettes    Last attempt to quit: 08/03/2017    Years since quitting: 2.0  . Smokeless tobacco: Never Used  Substance and Sexual Activity  . Alcohol use: Not Currently    Comment: 10/12/2017 alcohol free since 2017,  heavy drinker in the past  . Drug use: Not Currently    Comment: "<2003 whatever was around; nothing since"  . Sexual activity: Not Currently  Other Topics Concern  . Not on file  Social History Narrative   Disabled from Back since 2007   Social Determinants of Health   Financial Resource Strain:   . Difficulty of Paying Living Expenses:   Food Insecurity:   . Worried About Charity fundraiser in the Last Year:   . Arboriculturist in the Last Year:   Transportation Needs:   . Film/video editor (Medical):   Marland Kitchen Lack of Transportation (Non-Medical):   Physical Activity:   . Days of Exercise per Week:   . Minutes of Exercise per Session:   Stress:   . Feeling of Stress :   Social Connections:   . Frequency of Communication with Friends and Family:   . Frequency of Social Gatherings with Friends and Family:   . Attends Religious Services:   . Active Member of Clubs or Organizations:   . Attends Archivist Meetings:   Marland Kitchen Marital Status:    Family History  Problem  Relation Age of Onset  . Alcoholism Mother   . Heart disease Father        Massive heart attack  . Heart attack Father   . Atrial fibrillation Father   . Colon cancer Father   . Colon cancer Maternal Grandfather 27  . Alcoholism Maternal Grandfather   . Renal cancer Cousin   . Ovarian cancer Sister    Scheduled Meds: . atorvastatin  80 mg Oral Daily  . budesonide (PULMICORT) nebulizer solution  0.25 mg Nebulization BID  . calcium acetate  1,334 mg Oral TID WC  . Chlorhexidine Gluconate Cloth  6 each Topical Daily  . feeding supplement (NEPRO CARB STEADY)  237 mL Oral BID BM  . gabapentin  300 mg Oral QHS  . gentamicin cream  1 application Topical Daily  . insulin aspart  0-6 Units Subcutaneous Q6H  . ipratropium-albuterol  3 mL Nebulization BID  . metoprolol tartrate  5 mg Intravenous Q6H  . pantoprazole (PROTONIX) IV  40 mg Intravenous Q12H  . sodium chloride flush  10-40 mL Intracatheter Q12H   Continuous Infusions: . heparin 1,350 Units/hr (09/01/19 0722)  . TPN ADULT (ION) 70 mL/hr at 08/31/19 1718   PRN Meds:.acetaminophen **OR** acetaminophen, albuterol, morphine injection, ondansetron **OR** ondansetron (ZOFRAN) IV, oxyCODONE-acetaminophen, sodium chloride flush, zolpidem No Known Allergies   Vital Signs: BP (!) 109/57   Pulse (!) 46   Temp 98.1 F (36.7 C) (Oral)   Resp 16   Ht 6\' 4"  (1.93 m)   Wt 78.9 kg   SpO2 99%   BMI 21.17 kg/m  Pain Scale: 0-10   Pain Score: 5    SpO2: SpO2: 99 % O2 Device:SpO2: 99 % O2 Flow Rate: .O2  Flow Rate (L/min): 2 L/min  IO: Intake/output summary:   Intake/Output Summary (Last 24 hours) at 09/01/2019 0914 Last data filed at 09/01/2019 0500 Gross per 24 hour  Intake 2213.96 ml  Output 200 ml  Net 2013.96 ml    LBM: Last BM Date: 08/23/19 Baseline Weight: Weight: 86.6 kg Most recent weight: Weight: 78.9 kg     Palliative Assessment/Data: 50%     Time In: 9:00 Time Out: 9:50 Time Total: 50 min. Visit consisted  of counseling and education dealing with the complex and emotionally intense issues surrounding the need for palliative care and symptom management in the setting of serious and potentially life-threatening illness. Greater than 50%  of this time was spent counseling and coordinating care related to the above assessment and plan.  Signed by: Florentina Jenny, PA-C Amber M. Tamala Julian, NP Palliative Medicine  Please contact Palliative Medicine Team phone at [phone] for questions and concerns.  For individual provider: See Shea Evans

## 2019-09-01 NOTE — Progress Notes (Signed)
PROGRESS NOTE    Aaron [name]  MVE:[id number] DOB: [birth year] DOA: 08/22/2019 PCP: Gwenlyn Saran [place]   Brief Narrative: 64 year old with past medical history significant for hypertension, hyperlipidemia, chronic A. fib on Eliquis, ESRD on peritoneal dialysis presented to emergency department for evaluation of shortness of breath.  Patient does have a history of COPD, he recently quit smoking few months ago.  He does have some wheezing at baseline.  Came to the ER with worsening shortness of breath. In the emergency room, he was comfortable, found to have A. fib with RVR started on Cardizem drip.  [name].  On 5/13 patient is started having intermittent biliary vomiting.  CT scan showed a small bowel obstruction.  Surgery consulted in the morning of 514.  On 517 no improvement in the small bowel obstruction.  Patient went for exploratory laparotomy by surgery.  On 518 PICC line for TPN.  Right IJ line temporary catheter for hemodialysis.  Right IJ for hemodialysis not working, and to proceed with IR for tunneled catheter.  Assessment & Plan:   Principal Problem:   COPD with acute exacerbation ([place]) Active Problems:   Essential hypertension   ESRD on dialysis ([place])   Exertional dyspnea   Acute exacerbation of CHF (congestive heart failure) (HCC)   Atrial fibrillation with RVR (HCC)   Malnutrition of moderate degree   Atrial fibrillation with rapid ventricular response (HCC)   ESRD on peritoneal dialysis ([place])   SBO (small bowel obstruction) ([place])   DNR (do not resuscitate)  1-COPD with acute exacerbation: Improved Continue with bronchodilators as needed.  Off of oxygen.  2-Small bowel obstruction secondary to adhesion: No improvement with conservative management.  Status post exploratory laparotomy Holding  Eliquis and Plavix since 5/13 Started on TPN Diet advance to soft, weaning TPN.  Had small watery stool.    3-chronic A. fib with RVR: On IV metoprolol Holding Eliquis Discussed with surgery okay to start heparin drip. Discussed with IR, ok to start Heparin Gtt.  Discussed with Sx , transition to eliquis 5/21 if patient tolerates diet.   ESRD  on peritoneal dialysis: Follow by Nephrology.  He will need to convert to hemodialysis through DC for 1 or 2 months after surgery 5/17  Nutrition Problem: Moderate Malnutrition Etiology: chronic illness(COPD, ESRD on PD)    Signs/Symptoms: energy intake < 75% for > or equal to 1 month, mild fat depletion, moderate fat depletion, mild muscle depletion, moderate muscle depletion    Interventions: TPN, Boost Breeze  Estimated body mass index is 21.17 kg/m as calculated from the following:   Height as of this encounter: 6\' 4"  (1.93 m).   Weight as of this encounter: 78.9 kg.   DVT prophylaxis: Heparin Gtt Code Status: Full code Family Communication: care discussed with patient Disposition Plan:  Status is: Inpatient  Needs to arrange out patient HD, advancing diet post sx, on  heparin gt for A fib, plan to transition to eliquis 5/21 if tolerates diet  Dispo: The patient is from: Home               Anticipated d/c is to: Home               Anticipated d/c date is: 1-days              Patient currently ; not medical stable         Consultants:   General surgery   IR  Procedures:   none  Antimicrobials:    Subjective: Had small watery Bowel movement.  He will take it slow with food, denies worsening pain.    Objective: Vitals:   09/01/19 1030 09/01/19 1100 09/01/19 1105 09/01/19 1126  BP: 124/89 132/87 (!) 142/109 (!) 145/104  Pulse: (!) 47 62 70 66  Resp:   20 18  Temp:   (!) 97.5 F (36.4 C) 97.9 F (36.6 C)  TempSrc:   Oral Oral  SpO2:   96% 97%  Weight:      Height:        Intake/Output Summary (Last 24 hours) at 09/01/2019 1341 Last data filed at 09/01/2019 1105 Gross per 24 hour  Intake 1913.96 ml    Output 2100 ml  Net -186.04 ml   Filed Weights   08/31/19 0404 09/01/19 0059 09/01/19 0700  Weight: 77.3 kg 78.3 kg 78.9 kg    Examination:  General exam: NAD Respiratory system: CTA Cardiovascular system: S 1, S 2 RRR Gastrointestinal system: BS presents, soft, nt, nd Central nervous system: Alert, and oriented.  Extremities: Symmetry power.  Skin: No rashes.    Data Reviewed: I have personally reviewed following labs and imaging studies  CBC: Recent Labs  Lab 08/25/19 1740 08/25/19 1740 08/27/19 0526 08/27/19 0526 08/28/19 0324 08/30/19 0500 08/30/19 1040 08/31/19 0500 09/01/19 0625  WBC 20.8*   < > 16.3*   < > 10.7* 12.6* 12.9* 13.2* 10.5  NEUTROABS 19.9*  --  14.1*  --  9.3*  --   --   --   --   HGB 12.7*   < > 12.9*   < > 12.8* 11.7* 12.3* 12.6* 11.8*  HCT 39.2   < > 39.9   < > 40.0 37.3* 38.3* 39.4 37.4*  MCV 99.2   < > 101.8*   < > 99.0 105.7* 99.7 99.7 98.9  PLT 340   < > 276   < > 258 242 257 250 203   < > = values in this interval not displayed.   Basic Metabolic Panel: Recent Labs  Lab 08/28/19 0324 08/28/19 0324 08/29/19 0345 08/29/19 1103 08/30/19 1040 08/30/19 1138 08/31/19 0500 09/01/19 0625  NA 140   < > 139  --  138 138 137 135  K 3.2*   < > 3.2*  --  3.8 3.7 3.6 3.6  CL 101   < > 96*  --  98 99 98 99  CO2 28   < > 30  --  26 26 26 25   GLUCOSE 135*   < > 163*  --  114* 123* 92 84  BUN 61*   < > 49*  --  55* 55* 40* 65*  CREATININE 6.39*   < > 6.12*  --  6.84* 6.77* 5.19* 5.98*  CALCIUM 7.4*   < > 7.8*  --  7.7* 7.5* 7.6* 7.6*  MG 1.5*  --  1.9  --  1.9  --  1.8 1.8  PHOS 5.9*   < >  --  5.8* 6.5* 6.3* 3.7 3.3   < > = values in this interval not displayed.   GFR: Estimated Creatinine Clearance: 14.1 mL/min (A) (by C-G formula based on SCr of 5.98 mg/dL (H)). Liver Function Tests: Recent Labs  Lab 08/28/19 0324 08/28/19 0324 08/29/19 0345 08/30/19 1040 08/30/19 1138 08/31/19 0500 09/01/19 0625  AST 13*  --  15 12*  --  16  13*  ALT 9  --  9 <5  --  6 <5  ALKPHOS 78  --  81 65  --  71 59  BILITOT 0.6  --  0.6 0.7  --  0.8 0.4  PROT 5.7*  --  6.1* 5.5*  --  5.7* 5.1*  ALBUMIN 1.9*   < > 2.1* 2.2* 2.0* 2.1* 1.9*   < > = values in this interval not displayed.   Recent Labs  Lab 08/25/19 1740  LIPASE 23   No results for input(s): AMMONIA in the last 168 hours. Coagulation Profile: No results for input(s): INR, PROTIME in the last 168 hours. Cardiac Enzymes: No results for input(s): CKTOTAL, CKMB, CKMBINDEX, TROPONINI in the last 168 hours. BNP (last 3 results) No results for input(s): PROBNP in the last 8760 hours. HbA1C: No results for input(s): HGBA1C in the last 72 hours. CBG: Recent Labs  Lab 08/31/19 0720 08/31/19 1136 08/31/19 1641 09/01/19 0053 09/01/19 0554  GLUCAP 100* 111* 97 131* 96   Lipid Profile: Recent Labs    08/30/19 1040  TRIG 63   Thyroid Function Tests: No results for input(s): TSH, T4TOTAL, FREET4, T3FREE, THYROIDAB in the last 72 hours. Anemia Panel: No results for input(s): VITAMINB12, FOLATE, FERRITIN, TIBC, IRON, RETICCTPCT in the last 72 hours. Sepsis Labs: No results for input(s): PROCALCITON, LATICACIDVEN in the last 168 hours.  Recent Results (from the past 240 hour(s))  SARS Coronavirus 2 by RT PCR (hospital order, performed in [facility] hospital lab) Nasopharyngeal Nasopharyngeal Swab     Status: None   Collection Time: 08/22/19  8:53 PM   Specimen: Nasopharyngeal Swab  Result Value Ref Range Status   SARS Coronavirus 2 NEGATIVE NEGATIVE Final    Comment: (NOTE) SARS-CoV-2 target nucleic acids are NOT DETECTED. The SARS-CoV-2 RNA is generally detectable in upper and lower respiratory specimens during the acute phase of infection. The lowest concentration of SARS-CoV-2 viral copies this assay can detect is 250 copies / mL. A negative result does not preclude SARS-CoV-2 infection and should not be used as the sole basis for treatment or other patient  management decisions.  A negative result may occur with improper specimen collection / handling, submission of specimen other than nasopharyngeal swab, presence of viral mutation(s) within the areas targeted by this assay, and inadequate number of viral copies (<250 copies / mL). A negative result must be combined with clinical observations, patient history, and epidemiological information. Fact Sheet for Patients:   StrictlyIdeas.no Fact Sheet for Healthcare Providers: BankingDealers.co.za This test is not yet approved or cleared  by the [place] FDA and has been authorized for detection and/or diagnosis of SARS-CoV-2 by FDA under an Emergency Use Authorization (EUA).  This EUA will remain in effect (meaning this test can be used) for the duration of the COVID-19 declaration under Section 564(b)(1) of the Act, 21 U.S.C. section 360bbb-3(b)(1), unless the authorization is terminated or revoked sooner. Performed at [facility], [address] [place], [place] [address]   Culture, blood (routine x 2)     Status: None   Collection Time: 08/22/19  9:34 PM   Specimen: BLOOD RIGHT ARM  Result Value Ref Range Status   Specimen Description BLOOD RIGHT ARM  Final   Special Requests   Final    BOTTLES DRAWN AEROBIC AND ANAEROBIC Blood Culture adequate volume   Culture   Final    NO GROWTH 5 DAYS Performed at [facility], [address], [place], [place] [address]    Report Status 08/27/2019 FINAL  Final  Culture, blood (  routine x 2)     Status: None   Collection Time: 08/22/19  9:35 PM   Specimen: BLOOD RIGHT ARM  Result Value Ref Range Status   Specimen Description BLOOD RIGHT ARM  Final   Special Requests   Final    BOTTLES DRAWN AEROBIC AND ANAEROBIC Blood Culture adequate volume   Culture   Final    NO GROWTH 5 DAYS Performed at [facility], [address] [place], [place] [address]    Report Status 08/27/2019 FINAL  Final          Radiology Studies: IR Fluoro Guide CV Line Left  Result Date: 08/31/2019 INDICATION: 64 year old with end-stage renal disease. Patient currently has a non tunneled right jugular catheter and needs a new tunneled dialysis catheter. The right jugular catheter was difficult to place and plan for placement of a left jugular tunneled dialysis catheter. EXAM: FLUOROSCOPIC AND ULTRASOUND GUIDED PLACEMENT OF A TUNNELED DIALYSIS CATHETER Physician: Stephan Minister. Anselm Pancoast, MD MEDICATIONS: Ancef 2 g; The antibiotic was administered within an appropriate time interval prior to skin puncture. ANESTHESIA/SEDATION: Versed 2.0 mg IV; Fentanyl 100 mcg IV; Moderate Sedation Time:  16 minutes The patient was continuously monitored during the procedure by the interventional radiology nurse under my direct supervision. FLUOROSCOPY TIME:  Fluoroscopy Time: 30 seconds, 5.3 mGy COMPLICATIONS: None immediate. PROCEDURE: The procedure was explained to the patient. The risks and benefits of the procedure were discussed and the patient's questions were addressed. Informed consent was obtained from the patient. The patient was placed supine on the interventional table. Ultrasound confirmed a patent left internal jugular vein. Ultrasound images were obtained for documentation. The left neck and chest was prepped and draped in a sterile fashion. The left neck was anesthetized with 1% lidocaine. Maximal barrier sterile technique was utilized including caps, mask, sterile gowns, sterile gloves, sterile drape, hand hygiene and skin antiseptic. A small incision was made with #11 blade scalpel. A 21 gauge needle directed into the left internal jugular vein with ultrasound guidance. A micropuncture dilator set was placed. A 28 cm tip to cuff Palindrome catheter was selected. The skin below the left clavicle was anesthetized and a small incision was made with an #11 blade scalpel. A subcutaneous tunnel was formed to the vein dermatotomy site. The  catheter was brought through the tunnel. The vein dermatotomy site was dilated to accommodate a peel-away sheath. The catheter was placed through the peel-away sheath and directed into the central venous structures. The tip of the catheter was placed in the right atrium with fluoroscopy. Fluoroscopic images were obtained for documentation. Both lumens were found to aspirate and flush well. The proper amount of heparin was flushed in both lumens. The vein dermatotomy site was closed using a single layer of absorbable suture and Dermabond. Gel-Foam was placed in the subcutaneous tunnel. The catheter was secured to the skin using Prolene suture. Right jugular catheter was removed at the end of the procedure. IMPRESSION: Successful placement of a left jugular tunneled dialysis catheter using ultrasound and fluoroscopic guidance. Electronically Signed   By: Markus Daft M.D.   On: 08/31/2019 15:29   IR US Guide Vasc Access Left  Result Date: 08/31/2019 INDICATION: 64 year old with end-stage renal disease. Patient currently has a non tunneled right jugular catheter and needs a new tunneled dialysis catheter. The right jugular catheter was difficult to place and plan for placement of a left jugular tunneled dialysis catheter. EXAM: FLUOROSCOPIC AND ULTRASOUND GUIDED PLACEMENT OF A TUNNELED DIALYSIS CATHETER Physician:  Adam R. Anselm Pancoast, MD MEDICATIONS: Ancef 2 g; The antibiotic was administered within an appropriate time interval prior to skin puncture. ANESTHESIA/SEDATION: Versed 2.0 mg IV; Fentanyl 100 mcg IV; Moderate Sedation Time:  16 minutes The patient was continuously monitored during the procedure by the interventional radiology nurse under my direct supervision. FLUOROSCOPY TIME:  Fluoroscopy Time: 30 seconds, 5.3 mGy COMPLICATIONS: None immediate. PROCEDURE: The procedure was explained to the patient. The risks and benefits of the procedure were discussed and the patient's questions were addressed. Informed consent  was obtained from the patient. The patient was placed supine on the interventional table. Ultrasound confirmed a patent left internal jugular vein. Ultrasound images were obtained for documentation. The left neck and chest was prepped and draped in a sterile fashion. The left neck was anesthetized with 1% lidocaine. Maximal barrier sterile technique was utilized including caps, mask, sterile gowns, sterile gloves, sterile drape, hand hygiene and skin antiseptic. A small incision was made with #11 blade scalpel. A 21 gauge needle directed into the left internal jugular vein with ultrasound guidance. A micropuncture dilator set was placed. A 28 cm tip to cuff Palindrome catheter was selected. The skin below the left clavicle was anesthetized and a small incision was made with an #11 blade scalpel. A subcutaneous tunnel was formed to the vein dermatotomy site. The catheter was brought through the tunnel. The vein dermatotomy site was dilated to accommodate a peel-away sheath. The catheter was placed through the peel-away sheath and directed into the central venous structures. The tip of the catheter was placed in the right atrium with fluoroscopy. Fluoroscopic images were obtained for documentation. Both lumens were found to aspirate and flush well. The proper amount of heparin was flushed in both lumens. The vein dermatotomy site was closed using a single layer of absorbable suture and Dermabond. Gel-Foam was placed in the subcutaneous tunnel. The catheter was secured to the skin using Prolene suture. Right jugular catheter was removed at the end of the procedure. IMPRESSION: Successful placement of a left jugular tunneled dialysis catheter using ultrasound and fluoroscopic guidance. Electronically Signed   By: Markus Daft M.D.   On: 08/31/2019 15:29        Scheduled Meds: . atorvastatin  80 mg Oral Daily  . budesonide (PULMICORT) nebulizer solution  0.25 mg Nebulization BID  . calcium acetate  1,334 mg Oral  TID WC  . Chlorhexidine Gluconate Cloth  6 each Topical Daily  . feeding supplement (NEPRO CARB STEADY)  237 mL Oral BID BM  . gabapentin  300 mg Oral QHS  . gentamicin cream  1 application Topical Daily  . insulin aspart  0-6 Units Subcutaneous Q6H  . ipratropium-albuterol  3 mL Nebulization BID  . metoprolol tartrate  5 mg Intravenous Q6H  . pantoprazole (PROTONIX) IV  40 mg Intravenous Q12H  . sodium chloride flush  10-40 mL Intracatheter Q12H   Continuous Infusions: . heparin 1,350 Units/hr (09/01/19 0722)  . TPN ADULT (ION) 70 mL/hr at 08/31/19 1718  . TPN ADULT (ION)       LOS: 10 days    Time spent: 35  minutes    Ferrel [place]ington A Kacey Vicuna, MD [facility]   If 7PM-7AM, please contact night-coverage [email or web address]  09/01/2019, 1:41 PM

## 2019-09-01 NOTE — TOC Initial Note (Addendum)
Transition of Care [facility]) - Initial/Assessment Note    Patient Details  Name: Aaron [name] MRN: [id number] Date of Birth: 10/22/1955  [facility] CM/SW Contact:    Zenon Mayo, RN Phone Number: 09/01/2019, 5:01 PM  Clinical Narrative:                 From home alone, NCM made referral to [facility] with [facility] for outpatient palliative servies, a NP will see him on Monday.  NCM offered choice for [place], [place], [place], [place] and [place], he states he does not have a preference, NCM made referral to [facility], awaiting to hear back to see if they can take referral.  Patient is on TPN, but may be off when discharged. On full liquids.  Expected Discharge Plan: [place] Barriers to Discharge: Continued Medical Work up   Patient Goals and CMS Choice Patient states their goals for this hospitalization and ongoing recovery are:: get better CMS Medicare.gov Compare Post Acute Care list provided to:: Patient Choice offered to / list presented to : Patient  Expected Discharge Plan and Services Expected Discharge Plan: [place]   Discharge Planning Services: CM Consult Post Acute Care Choice: [place] arrangements for the past 2 months: Single Family Home                   DME Agency: NA       HH Arranged: RN, PT, OT, Nurse's Aide, Social Work [employer] Agency: [facility] [employer] [facility] [employer] Date [place]: 09/01/19 Time [place]: 1700 Representative spoke with at [place]: [place]  Prior Living Arrangements/Services Living arrangements for the past 2 months: [place] with:: Self Patient language and need for interpreter reviewed:: Yes Do you feel safe going back to the place where you live?: Yes      Need for Family Participation in Patient Care: Yes (Comment) Care giver support system in place?: Yes (comment)   Criminal Activity/Legal Involvement Pertinent to  Current Situation/Hospitalization: No - Comment as needed  Activities of Daily Living Home Assistive Devices/Equipment: Eyeglasses, Nebulizer, Cane (specify quad or straight), Environmental consultant (specify type), Wheelchair, Shower chair with back, Grab bars in shower, Dentures (specify type) ADL Screening (condition at time of admission) Patient's cognitive ability adequate to safely complete daily activities?: Yes Is the patient deaf or have difficulty hearing?: No Does the patient have difficulty seeing, even when wearing glasses/contacts?: No Does the patient have difficulty concentrating, remembering, or making decisions?: No Patient able to express need for assistance with ADLs?: Yes Does the patient have difficulty dressing or bathing?: No Independently performs ADLs?: Yes (appropriate for developmental age) Does the patient have difficulty walking or climbing stairs?: Yes Weakness of Legs: Both Weakness of Arms/Hands: None  Permission Sought/Granted                  Emotional Assessment Appearance:: Appears stated age Attitude/Demeanor/Rapport: Engaged Affect (typically observed): Appropriate Orientation: : Oriented to Place, Oriented to Self, Oriented to  Time, Oriented to Situation Alcohol / Substance Use: Not Applicable Psych Involvement: No (comment)  Admission diagnosis:  Exertional dyspnea [R06.00] Atrial fibrillation with rapid ventricular response (HCC) [I48.91] ESRD on peritoneal dialysis ([place]) [N18.6, Z99.2] Contusion of left great toe without damage to nail, initial encounter [S90.112A] Acute on chronic congestive heart failure, unspecified heart failure type ([place]) [I50.9] Patient Active Problem List   Diagnosis Date Noted  . Atrial fibrillation  with rapid ventricular response ([place])   . ESRD on peritoneal dialysis ([place])   . SBO (small bowel obstruction) ([place])   . DNR (do not resuscitate)   . Malnutrition of moderate degree 08/31/2019  . Acute exacerbation of CHF  (congestive heart failure) ([place]) 08/23/2019  . Atrial fibrillation with RVR ([place]) 08/23/2019  . Exertional dyspnea 08/22/2019  . Hyperglycemia 05/27/2019  . Syncope 05/26/2019  . 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 Back pain 03/31/2018  . Noncompliance with medication regimen 03/31/2018  . CKD (chronic kidney disease) stage 5, GFR less than 15 ml/min (HCC) 02/03/2018  . COPD with acute exacerbation ([place]) 02/03/2018  . Atrial fibrillation, chronic ([place]) 02/03/2018  . Discitis 01/27/2018  . Discitis of lumbar region 01/09/2018  . Paraspinal abscess ([place]) 01/08/2018  . ESRD on dialysis ([place]) 12/30/2017  . Hematoma of arm, right, initial encounter 11/23/2017  . Protein-calorie malnutrition, severe 10/15/2017  . Hyperlipidemia 08/24/2017  . Schatzki's ring of distal esophagus 04/27/2017  . Encounter for therapeutic drug monitoring 03/03/2017  . History of hepatitis C, naturally cleared  02/20/2017  . Ischemic cardiomyopathy 01/27/2017  . Dyslipidemia 01/27/2017  . AF (paroxysmal atrial fibrillation) ([place]) 01/27/2017  . CAD (coronary artery disease) 01/21/2017  . Polycystic kidney disease 12/14/2016  . Syncope, vasovagal 09/23/2014  . COPD (chronic obstructive pulmonary disease) ([place])   . COLD (chronic obstructive lung disease) ([name])   . Depression   . PAD (peripheral artery disease) ([place]) 09/19/2014  . Preop cardiovascular exam 10/28/2010  . Tobacco abuse 10/28/2010  . UNSPECIFIED IRON DEFICIENCY ANEMIA 10/04/2009  . 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  . Essential hypertension 10/04/2009  . Esophageal reflux 10/04/2009  . PRECORDIAL PAIN 10/04/2009   PCP:  Gwenlyn Saran [place] [facility]:   [place], [place] [address] [place] [address] Phone: [phone] Fax: [phone]     Social Determinants of Health (SDOH) Interventions    Readmission Risk Interventions Readmission Risk Prevention Plan 09/01/2019  12/08/2017  Transportation Screening Complete Complete  PCP or Specialist Appt within 3-5 Days Complete -  HRI or Home Care Consult Complete -  Social Work Consult for [place] Planning/Counseling Complete -  Palliative Care Screening Complete -  Medication Review Press photographer) Complete Complete  HRI or Home Care Consult - Complete  Some recent data might be hidden

## 2019-09-01 NOTE — Progress Notes (Addendum)
Nutrition Follow-up  DOCUMENTATION CODES:   Non-severe (moderate) malnutrition in context of chronic illness  INTERVENTION:   -TPN management per pharmacy -D/c Boost Breeze po BID, each supplement provides 250 kcal and 9 grams of protein -Nepro Shake po BID, each supplement provides 425 kcal and 19 grams protein -RD will follow for diet advancement and adjust supplement regimen as appropriate  NUTRITION DIAGNOSIS:   Moderate Malnutrition related to chronic illness(COPD, ESRD on PD) as evidenced by energy intake < 75% for > or equal to 1 month, mild fat depletion, moderate fat depletion, mild muscle depletion, moderate muscle depletion.  Ongoing  GOAL:   Patient will meet greater than or equal to 90% of their needs  Progressing   MONITOR:   PO intake, Supplement acceptance, Diet advancement, Labs, Weight trends, Skin, I & O's  REASON FOR ASSESSMENT:   Rounds, Other (Comment)(New TPN)    ASSESSMENT:   Aaron [name] is a 64 y.o. male with medical history significant of hypertension hyperlipidemia, atrial fibrillation on Eliquis, ESRD on peritoneal dialysis presented to ED for evaluation of worsening shortness of breath  5/13- CT reveals partial SBO 5/14- NGT placed 5/16- clamp NGT, advanced to clear liquid diet 5/17- NPO, s/p Procedure:exploratory laparotomy, lysis of adhesions, small bowel serosal repair x3, resection of mesenteric nodule, attempted central venous catheter placement; PICC placed; TPN initiated 5/18- s/p placement of temporary IJ trialysis catheter  5/19- NGT removed, advanced to clear liquid diet, s/p tunnelled HD cath placement, advanced to full liquid diet  Reviewed I/O's: +1.7 L x 24 hours and +3.2 L since admission  UOP: 200 ml x 24 hours  Pt down in HD suite at time of visit.   Pt advanced to a full liquid diet yesterday; noted meal completion 25% at dinner. He accepted Boost Breeze supplement yesterday.   Pt remains dependent on TPN at 70  ml/hr, which provides 2120 kcals and 126 grams protein, meeting 87% of estimated kcal needs and 100% of estimated protein needs. Case discussed with pharmacist.   Labs reviewed: CBGS: 96-131 (inpatient orders for glycemic control are 0-6 units insulin aspart every 6 hours).   Diet Order:   Diet Order            Diet full liquid Room service appropriate? Yes; Fluid consistency: Thin  Diet effective now              EDUCATION NEEDS:   Education needs have been addressed  Skin:  Skin Assessment: Skin Integrity Issues: Skin Integrity Issues:: Incisions Incisions: closed rt neck, abdomen  Last BM:  08/23/19  Height:   Ht Readings from Last 1 Encounters:  08/29/19 6\' 4"  (1.93 m)    Weight:   Wt Readings from Last 1 Encounters:  09/01/19 78.9 kg    Ideal Body Weight:  91.8 kg  BMI:  Body mass index is 21.17 kg/m.  Estimated Nutritional Needs:   Kcal:  5361-4431  Protein:  125-140 grams  Fluid:  1000 mg + UOP    Loistine Chance, RD, LDN, CDCES Registered Dietitian II Certified Diabetes Care and Education Specialist Please refer to [facility] for RD and/or RD on-call/weekend/after hours pager

## 2019-09-01 NOTE — Progress Notes (Signed)
PHARMACY - TOTAL PARENTERAL NUTRITION CONSULT NOTE   Indication: Small bowel obstruction since 08/25/19  Patient Measurements: Height: 6\' 4"  (193 cm) Weight: 78.9 kg (173 lb 15.1 oz) IBW/kg (Calculated) : 86.8 TPN AdjBW (KG): 81.5 Body mass index is 21.17 kg/m. Usual Weight: 180 lbs  Assessment:  64 yo M admitted for COPD exacerbation and afib who developed SBO on 08/25/19. Last PO intake was 5/12 so patient is at moderate risk for refeeding syndrome. Weight has been stable at 180-200 lbs in the last 5 years with no weight loss until this admission, confirmed with patient.   Glucose / Insulin: BG <150 since stopping methylpredisolone. A1C 5.9, will change SSI to q6hr Electrolytes: Labs -  K 3.6, Mg 1.8, Phos 3.3, Na down 135 Renal: ESRD on PD, switch to HD per Gen Surg due to suboptimal dialysis on PD and abdominal surgery LFTs / TGs: Wnl, TGs 63  Prealbumin / albumin: Albumin 2.1  Intake / Output; MIVF: NGT output 300 ml, still makes urine 300-635ml/d although non recorded 5/18; + flatus, no BM yet; TPN to  80% goal rate + supplements = 100% of estimated needs.  GI Imaging: 5/13 CT concerning for possible small bowel obstruction Surgeries / Procedures:  5/17 ex lap - lysis of adhesions, small bowel serosal repair x3, resection of mesenteric nodule  Per surgery note 5/19 - stable for discharge once tolerating diet. Per renal - considering not continuing with dialysis, palliative consulted. Discussed plans with MD - will reduce TPN to 80% goal rate, with supplements ordered (even if just takes 1) will meet 100% of needs.   Central access: PICC line 08/29/19 TPN start date: 08/29/19  Nutritional Goals (RD recommendation 5/19): Kcal:  7096-2836; Protein:  125-140 grams Fluid: minimize for ESRD  Goal TPN rate is 75 mL/hr (provides 126g of protein, 360g dextrose, 72g lipids and 2448 kcals per day)  Current Nutrition:  Full liquid diet - 0 -25% charted, Boost x 1 yesterday Supplements  changed to Nepro Carb Steady bid today (each 1 will provide 425 kcal, 19.1g protein, 22.7g fat, 37.9g CHO) TPN @ 70 cc/hr  Plan:  Reduce TPN slightly to 60 ml/hr to provide 100.8g protein, 57.6g lipids, 288g CHO, and 1958 kcal meeting ~80% of the patient's needs. Using Clinisol 15% for ESRD patient.  Nepro carb steady bid + full liquid diet ordered - if given will provide >100% of goal needs Electrolytes in TPN: increase Na to 55 mEq/L, 5 meq/L K, no Ca, 5 meq/L Mag and no Phos.  Max Cl  Add standard MVI and trace elements but remove chromium (due to ESRD) Change very Sensitive SSI to q6hr and adjust as needed  Monitor TPN labs on Mon/Thurs   Thank you for allowing pharmacy to be a part of this patient's care.  Alycia Rossetti, PharmD, BCPS Clinical Pharmacist Clinical phone for 09/01/2019: [phone] 09/01/2019 9:05 AM   **Pharmacist phone directory can now be found on [email or web address] ([email or web address]).  Listed under [place].

## 2019-09-01 NOTE — Progress Notes (Signed)
Renal Navigator spoke with patient at HD bedside today. He was teary-eyed and states that he is ready for discharge and wants to get his family together on Saturday to let them know how he has been feeling. Navigator informed him that he has a seat time at his OP HD clinic for dialysis, MWF 11:45am (arrive at 11:30am), which gives him time to continue to process and contemplate the decision of stopping HD while he speaks with his family. We talked about choosing a family member to help advocate for him. He thinks his daughter or his ex-wife could be this person.  Renal Navigator updated his OP HD clinic/[facility] that patient will most likely be discharged this afternoon and that Palliative Medicine has met with him and made a referral to follow up on an outpatient basis as well. Navigator suggests to expect patient tomorrow at 11:30am from our conversation. Navigator will fax records to clinic when available.  Alphonzo Cruise, [place] Renal Navigator [phone]

## 2019-09-01 NOTE — Progress Notes (Signed)
ANTICOAGULATION CONSULT NOTE  Pharmacy Consult for heparin Indication: atrial fibrillation  No Known Allergies  Patient Measurements: Height: 6\' 4"  (193 cm) Weight: 78.9 kg (173 lb 15.1 oz) IBW/kg (Calculated) : 86.8 Heparin Dosing Weight: 81.5kg  Vital Signs: Temp: 98.1 F (36.7 C) (05/20 0700) Temp Source: Oral (05/20 0700) BP: 148/80 (05/20 0709) Pulse Rate: 65 (05/20 0709)  Labs: Recent Labs    08/30/19 1040 08/30/19 1040 08/30/19 1138 08/31/19 0500 09/01/19 0625 09/01/19 0626  HGB 12.3*   < >  --  12.6* 11.8*  --   HCT 38.3*  --   --  39.4 37.4*  --   PLT 257  --   --  250 203  --   APTT  --   --   --   --   --  36  HEPARINUNFRC  --   --   --   --   --  0.17*  CREATININE 6.84*  --  6.77* 5.19*  --   --    < > = values in this interval not displayed.    Estimated Creatinine Clearance: 16.3 mL/min (A) (by C-G formula based on SCr of 5.19 mg/dL (H)).  Assessment: 64 yo M starting on heparin for afib (CHADS2VASc = 4). Patient was on apixaban PTA but this was held for SBO, last dose given 5/13 at 10am. Expect that apixaban has been metabolized by now but given that patient is on dialysis, will check an aPTT to confirm correlation with HL. H/H, plt stable. Previously in Sept 2019, patient required up to 2300 units/hr of heparin with HL undetectable. Patient was on already on dialysis at the time.   Heparin level came back at 0.17, aPTT at 36 - both subtherapeutic. Hgb 11.8, plt 203. No s/sx of bleeding or infusion issues per nursing.   Goal of Therapy:  Heparin level 0.3-0.7 units/ml Monitor platelets by anticoagulation protocol: Yes   Plan:  Increase heparin to 1350 units/hr F/u 8 hour aptt/HL  F/u aPTT until correlates with heparin level  Monitor aPTT, daily HL, CBC/plt Monitor for signs/symptoms of bleeding    Antonietta Jewel, PharmD, BCCCP Clinical Pharmacist  09/01/2019 7:18 AM  Please check AMION for all [place] phone numbers After 10:00 PM, call  [place] [phone]

## 2019-09-01 NOTE — Progress Notes (Signed)
[facility] Progress Note     Assessment/ Plan:   1. SOB/COPD/pulmonary edema-Felt to be multifactorial - COPD/ Afib with rapid rate and volume overload. PD changes to increase ultrafiltration. S/p Steroids/inhalers/azithro.  2. ESRD-secondary to polycystic kidney disease. Has been on PD for 6 mos. followed in [place]. Albumin is low which can be problematic in PD and UF. Used4.25% PD fluid for max UF->over evning of 5/16 to 5/17 1657 ml net UF.  - Appreciate the [facility] TC 5/19 by VIR yesterday (working much better).  Seen on HD today 2K bath 1151/75 2.5L UF goal tolerating  He has a spot at [facility] 2nd shift tomorrow (MWF regimen) 1145AM.  OK to d/c from renal standpoint; he wants to  talk  with his family at home to let him know he doesn't want to continue dialysis. Speaking today with him he's concerned about the burden he would be to his family.  Lt sidedPICC line in place (5/17) for TPN.  3. Anemia- not an issue right now 4. Secondary hyperparathyroidism - calc OK- Phos5.8 (ok for now). We will continue to monitor; will start binder once he's eating. 5. HTN/volume-continue carvedilol and diltiazem for atrial fibrillation. Ultrafiltration as above. 6. Leukocytosis:Likely associated with steroids (13.2k). Fluid studies from peritoneal fluid negative for infection. 7. Partial small bowel obstruction: N.p.o. except for meds. Holding Lasix and off of steroids. Surgery following and appreciate assistance; dense adhesions on 5/17. 8. Hyperdense kidney cyst: Consider further evaluation with MRI in the future. 9.Hypokalemia:replete as needed.  Subjective:   Feelsokand no events overnight; pain in abd.   Denies f/cn/v/dyspnea; +appetite.   Objective:   BP 125/77   Pulse (!) 57   Temp 98.1 F (36.7 C) (Oral)   Resp 16   Ht _0  (1.93 m)   Wt 78.9 kg   SpO2 99%   BMI 21.17 kg/m   Intake/Output Summary (Last 24 hours) at  09/01/2019 0846 Last data filed at 09/01/2019 0500 Gross per 24 hour  Intake 2213.96 ml  Output 200 ml  Net 2013.96 ml   Weight change: -0.5 kg  Physical Exam: General:supine inbed Heart:bradycardia Lungs:Coarse bilateral breath sounds, no increased work of breathing Abdomen:Distented with +BS Extremities: Trace edema Dialysis Access: PD cath, [facility] TC 5/19 Access: Lt arm PICC  Imaging: IR Fluoro Guide CV Line Left  Result Date: 08/31/2019 INDICATION: 64 year old with end-stage renal disease. Patient currently has a non tunneled right jugular catheter and needs a new tunneled dialysis catheter. The right jugular catheter was difficult to place and plan for placement of a left jugular tunneled dialysis catheter. EXAM: FLUOROSCOPIC AND ULTRASOUND GUIDED PLACEMENT OF A TUNNELED DIALYSIS CATHETER Physician: Stephan Minister. Anselm Pancoast, MD MEDICATIONS: Ancef 2 g; The antibiotic was administered within an appropriate time interval prior to skin puncture. ANESTHESIA/SEDATION: Versed 2.0 mg IV; Fentanyl 100 mcg IV; Moderate Sedation Time:  16 minutes The patient was continuously monitored during the procedure by the interventional radiology nurse under my direct supervision. FLUOROSCOPY TIME:  Fluoroscopy Time: 30 seconds, 5.3 mGy COMPLICATIONS: None immediate. PROCEDURE: The procedure was explained to the patient. The risks and benefits of the procedure were discussed and the patient's questions were addressed. Informed consent was obtained from the patient. The patient was placed supine on the interventional table. Ultrasound confirmed a patent left internal jugular vein. Ultrasound images were obtained for documentation. The left neck and chest was prepped and draped in a sterile fashion. The left neck was anesthetized with 1% lidocaine. Maximal barrier  sterile technique was utilized including caps, mask, sterile gowns, sterile gloves, sterile drape, hand hygiene and skin antiseptic. A small incision was made  with #11 blade scalpel. A 21 gauge needle directed into the left internal jugular vein with ultrasound guidance. A micropuncture dilator set was placed. A 28 cm tip to cuff Palindrome catheter was selected. The skin below the left clavicle was anesthetized and a small incision was made with an #11 blade scalpel. A subcutaneous tunnel was formed to the vein dermatotomy site. The catheter was brought through the tunnel. The vein dermatotomy site was dilated to accommodate a peel-away sheath. The catheter was placed through the peel-away sheath and directed into the central venous structures. The tip of the catheter was placed in the right atrium with fluoroscopy. Fluoroscopic images were obtained for documentation. Both lumens were found to aspirate and flush well. The proper amount of heparin was flushed in both lumens. The vein dermatotomy site was closed using a single layer of absorbable suture and Dermabond. Gel-Foam was placed in the subcutaneous tunnel. The catheter was secured to the skin using Prolene suture. Right jugular catheter was removed at the end of the procedure. IMPRESSION: Successful placement of a left jugular tunneled dialysis catheter using ultrasound and fluoroscopic guidance. Electronically Signed   By: Markus Daft M.D.   On: 08/31/2019 15:29   IR Fluoro Guide CV Line Right  Result Date: 08/30/2019 INDICATION: 64 year old male with a history of chronic renal disease referred for hemodialysis catheter placement. EXAM: IMAGE GUIDED PLACEMENT OF TEMPORARY HEMODIALYSIS CATHETER MEDICATIONS: None ANESTHESIA/SEDATION: None FLUOROSCOPY TIME:  Fluoroscopy Time: 5 minutes 42 seconds (40 mGy). COMPLICATIONS: None PROCEDURE: Informed written consent was obtained from the patient after a discussion of the risks, benefits, and alternatives to treatment. Questions regarding the procedure were encouraged and answered. The right neck was prepped with chlorhexidine in a sterile fashion, and a sterile drape  was applied covering the operative field. Maximum barrier sterile technique with sterile gowns and gloves were used for the procedure. A timeout was performed prior to the initiation of the procedure. Ultrasound was performed demonstrating patent right internal jugular vein. A micropuncture kit was utilized to access the right internal jugular vein under direct, real-time ultrasound guidance after the overlying soft tissues were anesthetized with 1% lidocaine with epinephrine. Ultrasound image documentation was performed. With the initial needle placement in the supraclavicular region, the microwire would not pass centrally. Limited venogram was performed with contrast. This demonstrated scarring and small caliber of the vein, with patency of the SVC. Needle was withdrawn. A second access site more cephalad was selected. 1% lidocaine was used for local anesthesia. Micropuncture needle was used to access the right internal jugular vein. The Nitrex microwire was navigated into a position in the low internal jugular vein. The microwire would not cross into the SVC. The micro puncture kit was advanced over the wire into the low IJ. The microwire and the inner dilator were removed. A standard Glidewire was then used to navigate into the subclavian vein. Angled catheter was then placed on the Glidewire, and using an angled catheter and the Glidewire we were able to successfully navigate the scarred region of the low internal jugular vein. The Glidewire was then used to make a measurement for the length of the catheter. A 20 cm temp cath was selected. Guidewire was advanced to the level of the right atrium. A 20 cm hemodialysis catheter was then placed over the wire. Final catheter positioning was confirmed and documented  with a spot radiographic image. The catheter aspirates and flushes normally. The catheter was flushed with appropriate volume heparin dwells. Dressings were applied. The patient tolerated the procedure  well without immediate post procedural complication. 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 A low puncture will not allowed navigation of the scar region. The current temporary hemodialysis catheter cannot be converted. Would advise alternative site if the patient needs a tunneled hemodialysis catheter. IMPRESSION: Status post image guided placement of temporary hemodialysis catheter. Signed, Dulcy Fanny. Dellia Nims, RPVI [facility] Radiology Electronically Signed   By: Corrie Mckusick D.O.   On: 08/30/2019 11:26   IR US Guide Vasc Access Left  Result Date: 08/31/2019 INDICATION: 64 year old with end-stage renal disease. Patient currently has a non tunneled right jugular catheter and needs a new tunneled dialysis catheter. The right jugular catheter was difficult to place and plan for placement of a left jugular tunneled dialysis catheter. EXAM: FLUOROSCOPIC AND ULTRASOUND GUIDED PLACEMENT OF A TUNNELED DIALYSIS CATHETER Physician: Stephan Minister. Anselm Pancoast, MD MEDICATIONS: Ancef 2 g; The antibiotic was administered within an appropriate time interval prior to skin puncture. ANESTHESIA/SEDATION: Versed 2.0 mg IV; Fentanyl 100 mcg IV; Moderate Sedation Time:  16 minutes The patient was continuously monitored during the procedure by the interventional radiology nurse under my direct supervision. FLUOROSCOPY TIME:  Fluoroscopy Time: 30 seconds, 5.3 mGy COMPLICATIONS: None immediate. PROCEDURE: The procedure was explained to the patient. The risks and benefits of the procedure were discussed and the patient's questions were addressed. Informed consent was obtained from the patient. The patient was placed supine on the interventional table. Ultrasound confirmed a patent left internal jugular vein. Ultrasound images were obtained for documentation. The left neck and chest was prepped and draped in a sterile fashion. The left neck was anesthetized  with 1% lidocaine. Maximal barrier sterile technique was utilized including caps, mask, sterile gowns, sterile gloves, sterile drape, hand hygiene and skin antiseptic. A small incision was made with #11 blade scalpel. A 21 gauge needle directed into the left internal jugular vein with ultrasound guidance. A micropuncture dilator set was placed. A 28 cm tip to cuff Palindrome catheter was selected. The skin below the left clavicle was anesthetized and a small incision was made with an #11 blade scalpel. A subcutaneous tunnel was formed to the vein dermatotomy site. The catheter was brought through the tunnel. The vein dermatotomy site was dilated to accommodate a peel-away sheath. The catheter was placed through the peel-away sheath and directed into the central venous structures. The tip of the catheter was placed in the right atrium with fluoroscopy. Fluoroscopic images were obtained for documentation. Both lumens were found to aspirate and flush well. The proper amount of heparin was flushed in both lumens. The vein dermatotomy site was closed using a single layer of absorbable suture and Dermabond. Gel-Foam was placed in the subcutaneous tunnel. The catheter was secured to the skin using Prolene suture. Right jugular catheter was removed at the end of the procedure. IMPRESSION: Successful placement of a left jugular tunneled dialysis catheter using ultrasound and fluoroscopic guidance. Electronically Signed   By: Markus Daft M.D.   On: 08/31/2019 15:29   IR US Guide Vasc Access Right  Result Date: 08/30/2019 INDICATION: 64 year old male with a history of chronic renal disease referred for hemodialysis catheter placement. EXAM: IMAGE GUIDED PLACEMENT OF TEMPORARY HEMODIALYSIS CATHETER MEDICATIONS: None ANESTHESIA/SEDATION: None FLUOROSCOPY TIME:  Fluoroscopy Time: 5 minutes 42 seconds (  40 mGy). COMPLICATIONS: None PROCEDURE: Informed written consent was obtained from the patient after a discussion of the risks,  benefits, and alternatives to treatment. Questions regarding the procedure were encouraged and answered. The right neck was prepped with chlorhexidine in a sterile fashion, and a sterile drape was applied covering the operative field. Maximum barrier sterile technique with sterile gowns and gloves were used for the procedure. A timeout was performed prior to the initiation of the procedure. Ultrasound was performed demonstrating patent right internal jugular vein. A micropuncture kit was utilized to access the right internal jugular vein under direct, real-time ultrasound guidance after the overlying soft tissues were anesthetized with 1% lidocaine with epinephrine. Ultrasound image documentation was performed. With the initial needle placement in the supraclavicular region, the microwire would not pass centrally. Limited venogram was performed with contrast. This demonstrated scarring and small caliber of the vein, with patency of the SVC. Needle was withdrawn. A second access site more cephalad was selected. 1% lidocaine was used for local anesthesia. Micropuncture needle was used to access the right internal jugular vein. The Nitrex microwire was navigated into a position in the low internal jugular vein. The microwire would not cross into the SVC. The micro puncture kit was advanced over the wire into the low IJ. The microwire and the inner dilator were removed. A standard Glidewire was then used to navigate into the subclavian vein. Angled catheter was then placed on the Glidewire, and using an angled catheter and the Glidewire we were able to successfully navigate the scarred region of the low internal jugular vein. The Glidewire was then used to make a measurement for the length of the catheter. A 20 cm temp cath was selected. Guidewire was advanced to the level of the right atrium. A 20 cm hemodialysis catheter was then placed over the wire. Final catheter positioning was confirmed and documented with a spot  radiographic image. The catheter aspirates and flushes normally. The catheter was flushed with appropriate volume heparin dwells. Dressings were applied. The patient tolerated the procedure well without immediate post procedural complication. 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 A low puncture will not allowed navigation of the scar region. The current temporary hemodialysis catheter cannot be converted. Would advise alternative site if the patient needs a tunneled hemodialysis catheter. IMPRESSION: Status post image guided placement of temporary hemodialysis catheter. Signed, Dulcy Fanny. Dellia Nims, RPVI [facility] Radiology Electronically Signed   By: Corrie Mckusick D.O.   On: 08/30/2019 11:26    Labs: BMET Recent Labs  Lab 08/27/19 0526 08/28/19 0324 08/29/19 0345 08/29/19 1103 08/30/19 1040 08/30/19 1138 08/31/19 0500 09/01/19 0625  NA 143 140 139  --  138 138 137 135  K 3.9 3.2* 3.2*  --  3.8 3.7 3.6 3.6  CL 99 101 96*  --  98 99 98 99  CO2 _0 --  _1 GLUCOSE 93 135* 163*  --  114* 123* 92 84  BUN 67* 61* 49*  --  55* 55* 40* 65*  CREATININE 6.80* 6.39* 6.12*  --  6.84* 6.77* 5.19* 5.98*  CALCIUM 7.6* 7.4* 7.8*  --  7.7* 7.5* 7.6* 7.6*  PHOS 7.2* 5.9*  --  5.8* 6.5* 6.3* 3.7 3.3   CBC Recent Labs  Lab 08/25/19 1740 08/25/19 1740 08/27/19 0526 08/27/19 0526 08/28/19 0324 08/28/19 0324 08/30/19 0500 08/30/19 1040 08/31/19 0500 09/01/19 0625  WBC  20.8*   < > 16.3*   < > 10.7*   < > 12.6* 12.9* 13.2* 10.5  NEUTROABS 19.9*  --  14.1*  --  9.3*  --   --   --   --   --   HGB 12.7*   < > 12.9*   < > 12.8*   < > 11.7* 12.3* 12.6* 11.8*  HCT 39.2   < > 39.9   < > 40.0   < > 37.3* 38.3* 39.4 37.4*  MCV 99.2   < > 101.8*   < > 99.0   < > 105.7* 99.7 99.7 98.9  PLT 340   < > 276   < > 258   < > 242 257 250 203   < > = values in this interval not displayed.    Medications:     . atorvastatin  80 mg Oral Daily  . budesonide (PULMICORT) nebulizer solution  0.25 mg Nebulization BID  . calcium acetate  1,334 mg Oral TID WC  . Chlorhexidine Gluconate Cloth  6 each Topical Daily  . feeding supplement (NEPRO CARB STEADY)  237 mL Oral BID BM  . gabapentin  300 mg Oral QHS  . gentamicin cream  1 application Topical Daily  . insulin aspart  0-6 Units Subcutaneous Q6H  . ipratropium-albuterol  3 mL Nebulization BID  . metoprolol tartrate  5 mg Intravenous Q6H  . pantoprazole (PROTONIX) IV  40 mg Intravenous Q12H  . sodium chloride flush  10-40 mL Intracatheter Q12H      Otelia Santee, MD 09/01/2019, 8:46 AM

## 2019-09-02 LAB — APTT: aPTT: 54 seconds — ABNORMAL HIGH (ref 24–36)

## 2019-09-02 LAB — CBC
HCT: 37.3 % — ABNORMAL LOW (ref 39.0–52.0)
Hemoglobin: 11.6 g/dL — ABNORMAL LOW (ref 13.0–17.0)
MCH: 34.7 pg — ABNORMAL HIGH (ref 26.0–34.0)
MCHC: 31.1 g/dL (ref 30.0–36.0)
MCV: 111.7 fL — ABNORMAL HIGH (ref 80.0–100.0)
Platelets: 188 10*3/uL (ref 150–400)
RBC: 3.34 MIL/uL — ABNORMAL LOW (ref 4.22–5.81)
RDW: 15.2 % (ref 11.5–15.5)
WBC: 11.6 10*3/uL — ABNORMAL HIGH (ref 4.0–10.5)
nRBC: 0 % (ref 0.0–0.2)

## 2019-09-02 LAB — GLUCOSE, CAPILLARY
Glucose-Capillary: 105 mg/dL — ABNORMAL HIGH (ref 70–99)
Glucose-Capillary: 110 mg/dL — ABNORMAL HIGH (ref 70–99)
Glucose-Capillary: 110 mg/dL — ABNORMAL HIGH (ref 70–99)
Glucose-Capillary: 114 mg/dL — ABNORMAL HIGH (ref 70–99)
Glucose-Capillary: 126 mg/dL — ABNORMAL HIGH (ref 70–99)
Glucose-Capillary: 21 mg/dL — CL (ref 70–99)
Glucose-Capillary: 90 mg/dL (ref 70–99)

## 2019-09-02 LAB — HEPARIN LEVEL (UNFRACTIONATED): Heparin Unfractionated: <0.1 IU/mL — ABNORMAL LOW (ref 0.30–0.70)

## 2019-09-02 MED ORDER — RENA-VITE PO TABS
1.0000 | ORAL_TABLET | Freq: Every day | ORAL | Status: DC
  Administered 2019-09-03 – 2019-09-10 (×8): 1 via ORAL
  Filled 2019-09-02 (×8): qty 1

## 2019-09-02 MED ORDER — CLOPIDOGREL BISULFATE 75 MG PO TABS
75.0000 mg | ORAL_TABLET | Freq: Every day | ORAL | Status: DC
  Administered 2019-09-03: 75 mg via ORAL
  Filled 2019-09-02: qty 1

## 2019-09-02 MED ORDER — INSULIN ASPART 100 UNIT/ML ~~LOC~~ SOLN: 0.0000 [IU] | Freq: Three times a day (TID) | SUBCUTANEOUS | Status: DC

## 2019-09-02 MED ORDER — DEXTROSE 50 % IV SOLN
INTRAVENOUS | Status: AC
  Filled 2019-09-02: qty 50

## 2019-09-02 MED ORDER — APIXABAN 5 MG PO TABS
5.0000 mg | ORAL_TABLET | Freq: Two times a day (BID) | ORAL | Status: DC
  Administered 2019-09-02 – 2019-09-03 (×2): 5 mg via ORAL
  Filled 2019-09-02 (×2): qty 1

## 2019-09-02 NOTE — Progress Notes (Signed)
PHARMACY - TOTAL PARENTERAL NUTRITION CONSULT NOTE   Indication: Small bowel obstruction - now resolved  Patient Measurements: Height: 6\' 4"  (193 cm) Weight: 75.9 kg (167 lb 5.3 oz) IBW/kg (Calculated) : 86.8 TPN AdjBW (KG): 81.5 Body mass index is 20.37 kg/m. Usual Weight: 180 lbs  Assessment:  64 yo M admitted for COPD exacerbation and afib who developed SBO on 08/25/19. Last PO intake was 5/12 so patient is at moderate risk for refeeding syndrome. Weight has been stable at 180-200 lbs in the last 5 years with no weight loss until this admission, confirmed with patient.   Glucose / Insulin: BG <150 since stopping methylpredisolone. A1C 5.9, will change SSI to q6hr Electrolytes: Labs -  K 3.6, Mg 1.8, Phos 3.3, Na down 135 Renal: ESRD on PD, switch to HD per Gen Surg due to suboptimal dialysis on PD and abdominal surgery LFTs / TGs: Wnl, TGs 63  Prealbumin / albumin: Albumin 2.1  Intake / Output; MIVF: NGT output 300 ml, still makes urine 300-628ml/d although non recorded 5/18; + flatus, no BM yet; TPN to  80% goal rate + supplements = 100% of estimated needs.  GI Imaging: 5/13 CT concerning for possible small bowel obstruction Surgeries / Procedures:  5/17 ex lap - lysis of adhesions, small bowel serosal repair x3, resection of mesenteric nodule  Per surgery note 5/19 - stable for discharge once tolerating diet. Per renal - considering not continuing with dialysis, palliative consulted. Given the patient's reported intake on 5/20 (see below) - TPN plans were discussed with Dr. Tyrell Antonio and will d/c TPN after today's bag.    Central access: PICC line 08/29/19 TPN start date: 08/29/19  Nutritional Goals (RD recommendation 5/19): Kcal:  0932-6712; Protein:  125-140 grams Fluid: minimize for ESRD  Goal TPN rate is 75 mL/hr (provides 126g of protein, 360g dextrose, 72g lipids and 2448 kcals per day)  Current Nutrition:  Soft diet - patient endorses eating soup + crackers in HD  yesterday, chicken, potatoes, roll, and milk for dinner. Some of his pancakes this morning for breakfast. No supplements charted as given.   TPN @ 60 cc/hr  Plan:  D/c TPN after today's bag - per patient report appears to have eaten >60% of meals yesterday. Plan discussed with MD Pharmacy will sign off consult - please re-consult [place] if needed  Thank you for allowing pharmacy to be a part of this patient's care.  Alycia Rossetti, PharmD, BCPS Clinical Pharmacist Clinical phone for 09/02/2019: [phone] 09/02/2019 7:48 AM   **Pharmacist phone directory can now be found on [email or web address] ([email or web address]).  Listed under [place].

## 2019-09-02 NOTE — Progress Notes (Signed)
[facility] Progress Note     Assessment/ Plan:   1. SOB/COPD/pulmonary edema-Felt to be multifactorial - COPD/ Afib with rapid rate and volume overload. PD changes to increase ultrafiltration. S/p Steroids/inhalers/azithro.  2. ESRD-secondary to polycystic kidney disease. Has been on PD for 6 mos. followed in [place]. Albumin is low which can be problematic in PD and UF. Used4.25% PD fluid for max UF->over evning of 5/16 to 5/17 1657 ml net UF.   - Appreciate the [facility] TC 5/19 by VIR yesterday (working much better).  Last HD 5/20 (1.9L net UF)  Will plan on HD Saturday if he's still here.  Then next HD outpt on Monday; he has a spot at [facility] 2nd shift  (MWF regimen) 1145AM.   OK to d/c from renal standpoint; he wants to  talk  with his family at home to let him know he doesn't want to continue dialysis.   Lt sidedPICC line in place (5/17) for TPN; titrate off as PO intake increases.  3. Anemia- not an issue right now 4. Secondary hyperparathyroidism - calc OK- Phos5.8 (ok for now). We will continue to monitor; will start binder once he's eating. 5. HTN/volume-continue carvedilol and diltiazem for atrial fibrillation. Ultrafiltration as above. 6. Leukocytosis:Likely associated with steroids(13.2k). Fluid studies from peritoneal fluid negative for infection. 7. Partial small bowel obstruction: N.p.o. except for meds. Holding Lasix and off of steroids. Surgery following and appreciate assistance; dense adhesions on 5/17. 8. Hyperdense kidney cyst: Consider further evaluation with MRI in the future. 9.Hypokalemia:replete as needed.  Subjective:   Feelsokandno eventsovernight; pain in abd.   Denies f/cn/v/dyspnea; +appetite.   Objective:   BP (!) 114/98 (BP Location: Right Arm)   Pulse 72   Temp 98.5 F (36.9 C) (Oral)   Resp 16   Ht 6\' 4"  (1.93 m)   Wt 75.9 kg   SpO2 97%   BMI 20.37 kg/m   Intake/Output Summary  (Last 24 hours) at 09/02/2019 0955 Last data filed at 09/01/2019 1105 Gross per 24 hour  Intake --  Output 1900 ml  Net -1900 ml   Weight change: -2.4 kg  Physical Exam: General:supine inbed Heart: RRR Lungs:Coarse bilateral breath sounds, no increased work of breathing Abdomen:Distented with +BS Extremities: Trace edema Dialysis Access: PD cath, [facility] TC 5/19 Access: Lt arm PICC  Imaging: IR Fluoro Guide CV Line Left  Result Date: 08/31/2019 INDICATION: 64 year old with end-stage renal disease. Patient currently has a non tunneled right jugular catheter and needs a new tunneled dialysis catheter. The right jugular catheter was difficult to place and plan for placement of a left jugular tunneled dialysis catheter. EXAM: FLUOROSCOPIC AND ULTRASOUND GUIDED PLACEMENT OF A TUNNELED DIALYSIS CATHETER Physician: Stephan Minister. Anselm Pancoast, MD MEDICATIONS: Ancef 2 g; The antibiotic was administered within an appropriate time interval prior to skin puncture. ANESTHESIA/SEDATION: Versed 2.0 mg IV; Fentanyl 100 mcg IV; Moderate Sedation Time:  16 minutes The patient was continuously monitored during the procedure by the interventional radiology nurse under my direct supervision. FLUOROSCOPY TIME:  Fluoroscopy Time: 30 seconds, 5.3 mGy COMPLICATIONS: None immediate. PROCEDURE: The procedure was explained to the patient. The risks and benefits of the procedure were discussed and the patient's questions were addressed. Informed consent was obtained from the patient. The patient was placed supine on the interventional table. Ultrasound confirmed a patent left internal jugular vein. Ultrasound images were obtained for documentation. The left neck and chest was prepped and draped in a sterile fashion. The left neck was  anesthetized with 1% lidocaine. Maximal barrier sterile technique was utilized including caps, mask, sterile gowns, sterile gloves, sterile drape, hand hygiene and skin antiseptic. A small incision was made  with #11 blade scalpel. A 21 gauge needle directed into the left internal jugular vein with ultrasound guidance. A micropuncture dilator set was placed. A 28 cm tip to cuff Palindrome catheter was selected. The skin below the left clavicle was anesthetized and a small incision was made with an #11 blade scalpel. A subcutaneous tunnel was formed to the vein dermatotomy site. The catheter was brought through the tunnel. The vein dermatotomy site was dilated to accommodate a peel-away sheath. The catheter was placed through the peel-away sheath and directed into the central venous structures. The tip of the catheter was placed in the right atrium with fluoroscopy. Fluoroscopic images were obtained for documentation. Both lumens were found to aspirate and flush well. The proper amount of heparin was flushed in both lumens. The vein dermatotomy site was closed using a single layer of absorbable suture and Dermabond. Gel-Foam was placed in the subcutaneous tunnel. The catheter was secured to the skin using Prolene suture. Right jugular catheter was removed at the end of the procedure. IMPRESSION: Successful placement of a left jugular tunneled dialysis catheter using ultrasound and fluoroscopic guidance. Electronically Signed   By: Markus Daft M.D.   On: 08/31/2019 15:29   IR US Guide Vasc Access Left  Result Date: 08/31/2019 INDICATION: 64 year old with end-stage renal disease. Patient currently has a non tunneled right jugular catheter and needs a new tunneled dialysis catheter. The right jugular catheter was difficult to place and plan for placement of a left jugular tunneled dialysis catheter. EXAM: FLUOROSCOPIC AND ULTRASOUND GUIDED PLACEMENT OF A TUNNELED DIALYSIS CATHETER Physician: Stephan Minister. Anselm Pancoast, MD MEDICATIONS: Ancef 2 g; The antibiotic was administered within an appropriate time interval prior to skin puncture. ANESTHESIA/SEDATION: Versed 2.0 mg IV; Fentanyl 100 mcg IV; Moderate Sedation Time:  16 minutes The  patient was continuously monitored during the procedure by the interventional radiology nurse under my direct supervision. FLUOROSCOPY TIME:  Fluoroscopy Time: 30 seconds, 5.3 mGy COMPLICATIONS: None immediate. PROCEDURE: The procedure was explained to the patient. The risks and benefits of the procedure were discussed and the patient's questions were addressed. Informed consent was obtained from the patient. The patient was placed supine on the interventional table. Ultrasound confirmed a patent left internal jugular vein. Ultrasound images were obtained for documentation. The left neck and chest was prepped and draped in a sterile fashion. The left neck was anesthetized with 1% lidocaine. Maximal barrier sterile technique was utilized including caps, mask, sterile gowns, sterile gloves, sterile drape, hand hygiene and skin antiseptic. A small incision was made with #11 blade scalpel. A 21 gauge needle directed into the left internal jugular vein with ultrasound guidance. A micropuncture dilator set was placed. A 28 cm tip to cuff Palindrome catheter was selected. The skin below the left clavicle was anesthetized and a small incision was made with an #11 blade scalpel. A subcutaneous tunnel was formed to the vein dermatotomy site. The catheter was brought through the tunnel. The vein dermatotomy site was dilated to accommodate a peel-away sheath. The catheter was placed through the peel-away sheath and directed into the central venous structures. The tip of the catheter was placed in the right atrium with fluoroscopy. Fluoroscopic images were obtained for documentation. Both lumens were found to aspirate and flush well. The proper amount of heparin was flushed in both lumens.  The vein dermatotomy site was closed using a single layer of absorbable suture and Dermabond. Gel-Foam was placed in the subcutaneous tunnel. The catheter was secured to the skin using Prolene suture. Right jugular catheter was removed at the  end of the procedure. IMPRESSION: Successful placement of a left jugular tunneled dialysis catheter using ultrasound and fluoroscopic guidance. Electronically Signed   By: Markus Daft M.D.   On: 08/31/2019 15:29    Labs: BMET Recent Labs  Lab 08/27/19 0526 08/28/19 0324 08/29/19 0345 08/29/19 1103 08/30/19 1040 08/30/19 1138 08/31/19 0500 09/01/19 0625  NA 143 140 139  --  138 138 137 135  K 3.9 3.2* 3.2*  --  3.8 3.7 3.6 3.6  CL 99 101 96*  --  98 99 98 99  CO2 30 28 30   --  26 26 26 25   GLUCOSE 93 135* 163*  --  114* 123* 92 84  BUN 67* 61* 49*  --  55* 55* 40* 65*  CREATININE 6.80* 6.39* 6.12*  --  6.84* 6.77* 5.19* 5.98*  CALCIUM 7.6* 7.4* 7.8*  --  7.7* 7.5* 7.6* 7.6*  PHOS 7.2* 5.9*  --  5.8* 6.5* 6.3* 3.7 3.3   CBC Recent Labs  Lab 08/27/19 0526 08/27/19 0526 08/28/19 0324 08/30/19 0500 08/30/19 1040 08/31/19 0500 09/01/19 0625 09/02/19 0635  WBC 16.3*   < > 10.7*   < > 12.9* 13.2* 10.5 11.6*  NEUTROABS 14.1*  --  9.3*  --   --   --   --   --   HGB 12.9*   < > 12.8*   < > 12.3* 12.6* 11.8* 11.6*  HCT 39.9   < > 40.0   < > 38.3* 39.4 37.4* 37.3*  MCV 101.8*   < > 99.0   < > 99.7 99.7 98.9 111.7*  PLT 276   < > 258   < > 257 250 203 188   < > = values in this interval not displayed.    Medications:    . atorvastatin  80 mg Oral Daily  . budesonide (PULMICORT) nebulizer solution  0.25 mg Nebulization BID  . calcium acetate  1,334 mg Oral TID WC  . Chlorhexidine Gluconate Cloth  6 each Topical Daily  . dextrose      . feeding supplement (NEPRO CARB STEADY)  237 mL Oral BID BM  . gabapentin  300 mg Oral QHS  . gentamicin cream  1 application Topical Daily  . insulin aspart  0-6 Units Subcutaneous Q6H  . ipratropium-albuterol  3 mL Nebulization BID  . metoprolol tartrate  5 mg Intravenous Q6H  . pantoprazole (PROTONIX) IV  40 mg Intravenous Q12H  . sodium chloride flush  10-40 mL Intracatheter Q12H      Otelia Santee, MD 09/02/2019, 9:55 AM

## 2019-09-02 NOTE — Progress Notes (Signed)
ANTICOAGULATION CONSULT NOTE  Pharmacy Consult for heparin Indication: atrial fibrillation  No Known Allergies  Patient Measurements: Height: 6\' 4"  (193 cm) Weight: 75.9 kg (167 lb 5.3 oz) IBW/kg (Calculated) : 86.8 Heparin Dosing Weight: 81.5 kg  Vital Signs: Temp: 98.5 F (36.9 C) (05/21 0722) Temp Source: Oral (05/21 0722) BP: 114/98 (05/21 0722) Pulse Rate: 72 (05/21 0722)  Labs: Recent Labs    08/31/19 0500 08/31/19 0500 09/01/19 0625 09/01/19 0626 09/01/19 1549 09/02/19 0635  HGB 12.6*   < > 11.8*  --   --  11.6*  HCT 39.4  --  37.4*  --   --  37.3*  PLT 250  --  203  --   --  188  APTT  --   --   --  36 96* 54*  HEPARINUNFRC  --   --   --  0.17* 0.75* <0.10*  CREATININE 5.19*  --  5.98*  --   --   --    < > = values in this interval not displayed.    Estimated Creatinine Clearance: 13.6 mL/min (A) (by C-G formula based on SCr of 5.98 mg/dL (H)).  Assessment: 64 yo M continuing on heparin for afib (CHADS2VASc = 4). Patient was on apixaban PTA, but this was held for SBO, last dose given 5/13 at 10am Heparin drip started while apixaban on hold   Heparin level sub-therapeutic < 0.1 and aptt 55 seconds heparin drip 1350 uts/hr same as yesterday with levels at goal.   Heparin running through picc line and no problems noted.    Plan to transition back to apixaban tonight so no adjustments to drip  PTA apixaban 2.5mg  BID - noted to not be taking - pt sleeping - cont confirm But age < 64, wt > 60k and Cr > 1.5 - dose not meet criteria for lower apixaban dose  DC 2/21 on apixabn 5mg  BID - will restart    Goal of Therapy:  Heparin level 0.3-0.7 units/ml aPTT 66-102 seconds Monitor platelets by anticoagulation protocol: Yes   Plan:  Continue heparin at 1350 units/hr - stop 2200 tonight  apixban 5mg  BID - start tonight   Bandon.D. CPP, BCPS Clinical Pharmacist [phone] 09/02/2019 4:13 PM

## 2019-09-02 NOTE — Progress Notes (Signed)
PT Cancellation Note  Patient Details Name: Aaron [name] MRN: [id number] DOB: 1955-09-10   Cancelled Treatment:    Reason Eval/Treat Not Completed: Pain limiting ability to participate.  Talked with pt about source of pain and he is not using a pillow to splint when coughing, so is stressing abd incision.  Has asked to hold therapy over his discomfort, will reattempt his visit as time and pt allow.  Contacted nursing for meds for pain.   Ramond Dial 09/02/2019, 4:07 PM   Mee Hives, PT MS Acute Rehab Dept. Number: [place]

## 2019-09-02 NOTE — Care Management Important Message (Signed)
Important Message  Patient Details  Name: Aaron [name] MRN: [id number] Date of Birth: 12-Sep-1955   Medicare Important Message Given:  Yes     Shelda Altes 09/02/2019, 10:45 AM

## 2019-09-02 NOTE — Progress Notes (Signed)
Nutrition Follow-up  DOCUMENTATION CODES:   Non-severe (moderate) malnutrition in context of chronic illness  INTERVENTION:   -TPN management per pharmacy; to d/c today -Continue Nepro Shake po BID, each supplement provides 425 kcal and 19 grams protein -Renal MVI daily  NUTRITION DIAGNOSIS:   Moderate Malnutrition related to chronic illness(COPD, ESRD on PD) as evidenced by energy intake < 75% for > or equal to 1 month, mild fat depletion, moderate fat depletion, mild muscle depletion, moderate muscle depletion.  Ongoing  GOAL:   Patient will meet greater than or equal to 90% of their needs  Progressing   MONITOR:   PO intake, Supplement acceptance, Diet advancement, Labs, Weight trends, Skin, I & O's  REASON FOR ASSESSMENT:   Rounds, Other (Comment)(New TPN)    ASSESSMENT:   Aaron [name] is a 64 y.o. male with medical history significant of hypertension hyperlipidemia, atrial fibrillation on Eliquis, ESRD on peritoneal dialysis presented to ED for evaluation of worsening shortness of breath  5/13- CT reveals partial SBO 5/14- NGT placed 5/16- clamp NGT, advanced to clear liquid diet 5/17- NPO, s/pProcedure:exploratory laparotomy, lysis of adhesions, small bowel serosal repair x3, resection of mesenteric nodule, attempted central venous catheter placement; PICC placed; TPN initiated 5/18- s/p placement of temporary IJ trialysis catheter  5/19- NGT removed, advanced to clear liquid diet, s/p tunnelled HD cath placement, advanced to full liquid diet 5/20- advanced to soft diet   Reviewed I/O's: -1.9 L x 24 hours and +1.3 L since admission  Attempted to speak with pt via phone, however, message repots "user busy".   Pt advanced to a soft diet yesterday. No meal completion records available to review. Pt consumed a Nepro shake this morning.   Pt remains on TPN, receiving TPN @ 60 ml/hr to provide 1958 kcals and 101 grams protein, meeting 80% of estimated kcal needs  and 81% of estimated protein needs. Per pharmacy note, plan to d/c TPN today.   Palliative care following; pt to discuss with family regarding his decision to possible discontinue HD after discharge. Pt has an outpatient HD seat arranged in the even that he wants to pursue HD.   Medications reviewed and include phoslo.   Labs reviewed: CBGS: 21-126  (inpatient orders for glycemic control are 0-6 units insulin aspart every 6 hours).   Diet Order:   Diet Order            DIET SOFT Room service appropriate? Yes; Fluid consistency: Thin  Diet effective now              EDUCATION NEEDS:   Education needs have been addressed  Skin:  Skin Assessment: Skin Integrity Issues: Skin Integrity Issues:: Incisions Incisions: closed rt neck, abdomen  Last BM:  09/01/19  Height:   Ht Readings from Last 1 Encounters:  08/29/19 6\' 4"  (1.93 m)    Weight:   Wt Readings from Last 1 Encounters:  09/02/19 75.9 kg    Ideal Body Weight:  91.8 kg  BMI:  Body mass index is 20.37 kg/m.  Estimated Nutritional Needs:   Kcal:  4970-2637  Protein:  125-140 grams  Fluid:  1000 mg + UOP    Loistine Chance, RD, LDN, CDCES Registered Dietitian II Certified Diabetes Care and Education Specialist Please refer to [facility] for RD and/or RD on-call/weekend/after hours pager

## 2019-09-02 NOTE — Progress Notes (Signed)
PROGRESS NOTE    Aaron [name]  VPX:[id number] DOB: 1955/07/15 DOA: 08/22/2019 PCP: Gwenlyn Saran [place]   Brief Narrative: 64 year old with past medical history significant for hypertension, hyperlipidemia, chronic A. fib on Eliquis, ESRD on peritoneal dialysis presented to emergency department for evaluation of shortness of breath.  Patient does have a history of COPD, he recently quit smoking few months ago.  He does have some wheezing at baseline.  Came to the ER with worsening shortness of breath. In the emergency room, he was comfortable, found to have A. fib with RVR started on Cardizem drip.  [name].  On 5/13 patient is started having intermittent biliary vomiting.  CT scan showed a small bowel obstruction.  Surgery consulted in the morning of 514.  On 517 no improvement in the small bowel obstruction.  Patient went for exploratory laparotomy by surgery.  On 518 PICC line for TPN.  Right IJ line temporary catheter for hemodialysis.  Right IJ for hemodialysis not working, and to proceed with IR for tunneled catheter.  Assessment & Plan:   Principal Problem:   COPD with acute exacerbation ([place]) Active Problems:   Essential hypertension   ESRD on dialysis ([place])   Exertional dyspnea   Acute exacerbation of CHF (congestive heart failure) (HCC)   Atrial fibrillation with RVR (HCC)   Malnutrition of moderate degree   Atrial fibrillation with rapid ventricular response (HCC)   ESRD on peritoneal dialysis ([place])   SBO (small bowel obstruction) ([place])   DNR (do not resuscitate)  1-COPD with acute exacerbation: Improved Continue with bronchodilators as needed.  Off of oxygen.  2-Small bowel obstruction secondary to adhesion: No improvement with conservative management.  Status post exploratory laparotomy Holding  Eliquis and Plavix since 5/13 Treated with  TPN Diet advance to soft. Plan to stop PTN today.  Had small  watery stool on 09/01/2019  3-Chronic A. fib with RVR: On IV metoprolol Holding Eliquis Discussed with surgery okay to start heparin drip. Discussed with IR, ok to start Heparin Gtt.  Discussed with Sx , transition to eliquis 5/21 if patient tolerates diet.   ESRD  on peritoneal dialysis: Follow by Nephrology.  He will need to convert to hemodialysis through DC for 1 or 2 months after surgery 5/17  Nutrition Problem: Moderate Malnutrition Etiology: chronic illness(COPD, ESRD on PD)    Signs/Symptoms: energy intake < 75% for > or equal to 1 month, mild fat depletion, moderate fat depletion, mild muscle depletion, moderate muscle depletion    Interventions: TPN, Boost Breeze  Estimated body mass index is 20.37 kg/m as calculated from the following:   Height as of this encounter: 6\' 4"  (1.93 m).   Weight as of this encounter: 75.9 kg.   DVT prophylaxis: transition to eliquis.  Code Status: Full code Family Communication: care discussed with patient Disposition Plan:  Status is: Inpatient  Needs to arrange out patient HD, advancing diet post sx, on  heparin gt for A fib, plan to transition to eliquis 5/21 if tolerates diet  Dispo: The patient is from: Home               Anticipated d/c is to: Home               Anticipated d/c date is: 5/22, plan to stop TPN today. Monitor oral intake.               Patient currently ; not medical stable  Consultants:   General surgery   IR  Procedures:   none  Antimicrobials:    Subjective: Eating a little more. No BM today.     Objective: Vitals:   09/02/19 0424 09/02/19 0714 09/02/19 0722 09/02/19 0733  BP: 94/70 (!) 114/98 (!) 114/98   Pulse: 61 88 72   Resp: 17  16   Temp: 98.4 F (36.9 C)  98.5 F (36.9 C)   TempSrc: Oral  Oral   SpO2: 98%  97% 97%  Weight:      Height:       No intake or output data in the 24 hours ending 09/02/19 1532 Filed Weights   09/01/19 0059 09/01/19 0700 09/02/19 0015    Weight: 78.3 kg 78.9 kg 75.9 kg    Examination:  General exam: NAD Respiratory system: CTA Cardiovascular system: S 1, S 2 RRR Gastrointestinal system: BS present, soft, nt Central nervous system: Alert, following command Extremities: Symmetric power.  Skin: No rashes.    Data Reviewed: I have personally reviewed following labs and imaging studies  CBC: Recent Labs  Lab 08/27/19 0526 08/27/19 0526 08/28/19 0324 08/28/19 0324 08/30/19 0500 08/30/19 1040 08/31/19 0500 09/01/19 0625 09/02/19 0635  WBC 16.3*   < > 10.7*   < > 12.6* 12.9* 13.2* 10.5 11.6*  NEUTROABS 14.1*  --  9.3*  --   --   --   --   --   --   HGB 12.9*   < > 12.8*   < > 11.7* 12.3* 12.6* 11.8* 11.6*  HCT 39.9   < > 40.0   < > 37.3* 38.3* 39.4 37.4* 37.3*  MCV 101.8*   < > 99.0   < > 105.7* 99.7 99.7 98.9 111.7*  PLT 276   < > 258   < > 242 257 250 203 188   < > = values in this interval not displayed.   Basic Metabolic Panel: Recent Labs  Lab 08/28/19 0324 08/28/19 0324 08/29/19 0345 08/29/19 1103 08/30/19 1040 08/30/19 1138 08/31/19 0500 09/01/19 0625  NA 140   < > 139  --  138 138 137 135  K 3.2*   < > 3.2*  --  3.8 3.7 3.6 3.6  CL 101   < > 96*  --  98 99 98 99  CO2 28   < > 30  --  26 26 26 25   GLUCOSE 135*   < > 163*  --  114* 123* 92 84  BUN 61*   < > 49*  --  55* 55* 40* 65*  CREATININE 6.39*   < > 6.12*  --  6.84* 6.77* 5.19* 5.98*  CALCIUM 7.4*   < > 7.8*  --  7.7* 7.5* 7.6* 7.6*  MG 1.5*  --  1.9  --  1.9  --  1.8 1.8  PHOS 5.9*   < >  --  5.8* 6.5* 6.3* 3.7 3.3   < > = values in this interval not displayed.   GFR: Estimated Creatinine Clearance: 13.6 mL/min (A) (by C-G formula based on SCr of 5.98 mg/dL (H)). Liver Function Tests: Recent Labs  Lab 08/28/19 0324 08/28/19 0324 08/29/19 0345 08/30/19 1040 08/30/19 1138 08/31/19 0500 09/01/19 0625  AST 13*  --  15 12*  --  16 13*  ALT 9  --  9 <5  --  6 <5  ALKPHOS 78  --  81 65  --  71 59  BILITOT 0.6  --  0.6 0.7  --   0.8 0.4  PROT 5.7*  --  6.1* 5.5*  --  5.7* 5.1*  ALBUMIN 1.9*   < > 2.1* 2.2* 2.0* 2.1* 1.9*   < > = values in this interval not displayed.   No results for input(s): LIPASE, AMYLASE in the last 168 hours. No results for input(s): AMMONIA in the last 168 hours. Coagulation Profile: No results for input(s): INR, PROTIME in the last 168 hours. Cardiac Enzymes: No results for input(s): CKTOTAL, CKMB, CKMBINDEX, TROPONINI in the last 168 hours. BNP (last 3 results) No results for input(s): PROBNP in the last 8760 hours. HbA1C: No results for input(s): HGBA1C in the last 72 hours. CBG: Recent Labs  Lab 09/02/19 0000 09/02/19 0429 09/02/19 0446 09/02/19 0449 09/02/19 1128  GLUCAP 126* 21* 110* 114* 110*   Lipid Profile: No results for input(s): CHOL, HDL, LDLCALC, TRIG, CHOLHDL, LDLDIRECT in the last 72 hours. Thyroid Function Tests: No results for input(s): TSH, T4TOTAL, FREET4, T3FREE, THYROIDAB in the last 72 hours. Anemia Panel: No results for input(s): VITAMINB12, FOLATE, FERRITIN, TIBC, IRON, RETICCTPCT in the last 72 hours. Sepsis Labs: No results for input(s): PROCALCITON, LATICACIDVEN in the last 168 hours.  No results found for this or any previous visit (from the past 240 hour(s)).       Radiology Studies: No results found.      Scheduled Meds: . atorvastatin  80 mg Oral Daily  . budesonide (PULMICORT) nebulizer solution  0.25 mg Nebulization BID  . calcium acetate  1,334 mg Oral TID WC  . Chlorhexidine Gluconate Cloth  6 each Topical Daily  . dextrose      . feeding supplement (NEPRO CARB STEADY)  237 mL Oral BID BM  . gabapentin  300 mg Oral QHS  . gentamicin cream  1 application Topical Daily  . insulin aspart  0-6 Units Subcutaneous Q6H  . ipratropium-albuterol  3 mL Nebulization BID  . metoprolol tartrate  5 mg Intravenous Q6H  . [START ON 09/03/2019] multivitamin  1 tablet Oral QHS  . pantoprazole (PROTONIX) IV  40 mg Intravenous Q12H  . sodium  chloride flush  10-40 mL Intracatheter Q12H   Continuous Infusions: . heparin 1,350 Units/hr (09/02/19 1142)  . TPN ADULT (ION) 60 mL/hr at 09/01/19 1710     LOS: 11 days    Time spent: 35  minutes    Paddy Walthall A Larcenia Holaday, MD [facility]   If 7PM-7AM, please contact night-coverage [email or web address]  09/02/2019, 3:32 PM

## 2019-09-02 NOTE — Progress Notes (Signed)
   09/01/19 2350  Assess: MEWS Score  Temp 98.3 F (36.8 C)  BP 109/68  Pulse Rate (!) 109  Resp 18  SpO2 98 %  O2 Device Room Air  Assess: MEWS Score  MEWS Temp 0  MEWS Systolic 0  MEWS Pulse 1  MEWS RR 1  MEWS LOC 0  MEWS Score 2  MEWS Score Color Yellow  Assess: if the MEWS score is Yellow or Red  Were vital signs taken at a resting state? Yes  Focused Assessment Documented focused assessment  Early Detection of Sepsis Score *See Row Information* Low  MEWS guidelines implemented *See Row Information* Yes  Treat  MEWS Interventions Other (Comment) (HR went up due to N/V )  Document  Patient Outcome Stabilized after interventions

## 2019-09-03 ENCOUNTER — Inpatient Hospital Stay (HOSPITAL_COMMUNITY): Payer: 59

## 2019-09-03 LAB — BASIC METABOLIC PANEL
Anion gap: 13 (ref 5–15)
BUN: 68 mg/dL — ABNORMAL HIGH (ref 8–23)
CO2: 24 mmol/L (ref 22–32)
Calcium: 8.3 mg/dL — ABNORMAL LOW (ref 8.9–10.3)
Chloride: 99 mmol/L (ref 98–111)
Creatinine, Ser: 5.45 mg/dL — ABNORMAL HIGH (ref 0.61–1.24)
GFR calc Af Amer: 12 mL/min — ABNORMAL LOW (ref >60)
GFR calc non Af Amer: 10 mL/min — ABNORMAL LOW (ref >60)
Glucose, Bld: 80 mg/dL (ref 70–99)
Potassium: 4.6 mmol/L (ref 3.5–5.1)
Sodium: 136 mmol/L (ref 135–145)

## 2019-09-03 LAB — CBC
HCT: 37.4 % — ABNORMAL LOW (ref 39.0–52.0)
Hemoglobin: 12.1 g/dL — ABNORMAL LOW (ref 13.0–17.0)
MCH: 32.2 pg (ref 26.0–34.0)
MCHC: 32.4 g/dL (ref 30.0–36.0)
MCV: 99.5 fL (ref 80.0–100.0)
Platelets: 211 10*3/uL (ref 150–400)
RBC: 3.76 MIL/uL — ABNORMAL LOW (ref 4.22–5.81)
RDW: 14.9 % (ref 11.5–15.5)
WBC: 12.3 10*3/uL — ABNORMAL HIGH (ref 4.0–10.5)
nRBC: 0 % (ref 0.0–0.2)

## 2019-09-03 LAB — APTT: aPTT: 102 seconds — ABNORMAL HIGH (ref 24–36)

## 2019-09-03 LAB — GLUCOSE, CAPILLARY
Glucose-Capillary: 94 mg/dL (ref 70–99)
Glucose-Capillary: 79 mg/dL (ref 70–99)
Glucose-Capillary: 75 mg/dL (ref 70–99)
Glucose-Capillary: 84 mg/dL (ref 70–99)

## 2019-09-03 LAB — HEPARIN LEVEL (UNFRACTIONATED): Heparin Unfractionated: 1.03 IU/mL — ABNORMAL HIGH (ref 0.30–0.70)

## 2019-09-03 MED ORDER — IOHEXOL 9 MG/ML PO SOLN
500.0000 mL | ORAL | Status: AC
  Administered 2019-09-03: 500 mL via ORAL

## 2019-09-03 MED ORDER — IOHEXOL 300 MG/ML  SOLN
100.0000 mL | Freq: Once | INTRAMUSCULAR | Status: AC | PRN
  Administered 2019-09-03: 100 mL via INTRAVENOUS

## 2019-09-03 NOTE — Progress Notes (Signed)
Unable to drain patient form the PD catheter; No fluid draining from the PD catheter.

## 2019-09-03 NOTE — Progress Notes (Signed)
5 Days Post-Op   Subjective/Chief Complaint: Signed off by team during week , he is eating and having bms, was due to go home, this am had coughing spell followed by pink fluid from abdomen that he describes as shooting out, a little more since then, I was asked to come see him again   Objective: Vital signs in last 24 hours: Temp:  [97.3 F (36.3 C)-98.5 F (36.9 C)] 97.3 F (36.3 C) (05/22 1200) Pulse Rate:  [73-98] 77 (05/22 1200) Resp:  [16-20] 18 (05/22 1200) BP: (110-130)/(89-97) 110/93 (05/22 1200) SpO2:  [89 %-100 %] 100 % (05/22 1200) FiO2 (%):  [21 %] 21 % (05/22 0816) Weight:  [77.2 kg] 77.2 kg (05/22 0419) Last BM Date: 09/02/19  Intake/Output from previous day: 05/21 0701 - 05/22 0700 In: 2189.7 [P.O.:360; I.V.:1829.7] Out: 300 [Urine:300] Intake/Output this shift: Total I/O In: 240 [P.O.:240] Out: -   GI: pd catheter right abdomen, soft minimally tender, incision without infection I cannot get it to leak now (there are towels near him that I do think look like peritoneal fluid)  Lab Results:  Recent Labs    09/02/19 0635 09/03/19 0529  WBC 11.6* 12.3*  HGB 11.6* 12.1*  HCT 37.3* 37.4*  PLT 188 211   BMET Recent Labs    09/01/19 0625 09/03/19 0529  NA 135 136  K 3.6 4.6  CL 99 99  CO2 25 24  GLUCOSE 84 80  BUN 65* 68*  CREATININE 5.98* 5.45*  CALCIUM 7.6* 8.3*   PT/INR No results for input(s): LABPROT, INR in the last 72 hours. ABG No results for input(s): PHART, HCO3 in the last 72 hours.  Invalid input(s): PCO2, PO2  Studies/Results: No results found.  Anti-infectives: Anti-infectives (From admission, onward)   Start     Dose/Rate Route Frequency Ordered Stop   08/31/19 1415  ceFAZolin (ANCEF) IVPB 2g/100 mL premix     2 g 200 mL/hr over 30 Minutes Intravenous  Once 08/31/19 1328 08/31/19 1358   08/31/19 1400  ceFAZolin (ANCEF) powder 2 g  Status:  Discontinued     2 g Other To Surgery 08/31/19 1314 08/31/19 1326   08/29/19 1145   ceFAZolin (ANCEF) IVPB 2g/100 mL premix     2 g 200 mL/hr over 30 Minutes Intravenous To ShortStay Surgical 08/29/19 1127 08/29/19 1445   08/23/19 2200  azithromycin (ZITHROMAX) tablet 500 mg  Status:  Discontinued    Note to Pharmacy: Pharmacy to adjust timing for next dose.   500 mg Oral Daily at bedtime 08/23/19 0233 08/25/19 2046   08/22/19 2130  azithromycin (ZITHROMAX) 500 mg in sodium chloride 0.9 % 250 mL IVPB     500 mg 250 mL/hr over 60 Minutes Intravenous  Once 08/22/19 2122 08/22/19 2245      Assessment/Plan: POD 5 elap/loa -I think he likely has dehisced fascia, will get ct as somewhat difficult to tell now -just took eliquis- he is not eviscerated at all and skin intact -will stop eliquis, npo after mn and see if needs to return to OR in am -binder/bedrest until figure this out -discussed plan with patient  LOS: 12 days    Rolm Bookbinder 09/03/2019

## 2019-09-03 NOTE — Progress Notes (Signed)
PROGRESS NOTE    Aaron [name]  NIO:[id number] DOB: 07-Jun-1955 DOA: 08/22/2019 PCP: Gwenlyn Saran [place]   Brief Narrative: 64 year old with past medical history significant for hypertension, hyperlipidemia, chronic A. fib on Eliquis, ESRD on peritoneal dialysis presented to emergency department for evaluation of shortness of breath.  Patient does have a history of COPD, he recently quit smoking few months ago.  He does have some wheezing at baseline.  Came to the ER with worsening shortness of breath. In the emergency room, he was comfortable, found to have A. fib with RVR started on Cardizem drip.  [name].  On 5/13 patient is started having intermittent biliary vomiting.  CT scan showed a small bowel obstruction.  Surgery consulted in the morning of 514.  On 517 no improvement in the small bowel obstruction.  Patient went for exploratory laparotomy by surgery.  On 518 PICC line for TPN.  Right IJ line temporary catheter for hemodialysis.  Right IJ for hemodialysis not working, and to proceed with IR for tunneled catheter.  Assessment & Plan:   Principal Problem:   COPD with acute exacerbation ([place]) Active Problems:   Essential hypertension   ESRD on dialysis ([place])   Exertional dyspnea   Acute exacerbation of CHF (congestive heart failure) ([facility])   Atrial fibrillation with RVR ([facility])   Malnutrition of moderate degree   Atrial fibrillation with rapid ventricular response ([facility])   ESRD on peritoneal dialysis ([place])   SBO (small bowel obstruction) ([place])   DNR (do not resuscitate)  1-COPD with acute exacerbation: Improved Continue with bronchodilators as needed.  Off of oxygen.  2-Small bowel obstruction secondary to adhesion: No improvement with conservative management.  Status post exploratory laparotomy Holding  Eliquis and Plavix since 5/13 Treated with  TPN Diet advance to soft. Plan to stop PTN today.  Had small  watery stool on 09/01/2019 Leaking serous sanguineous fluid from wound. Discussed with surgery, plan to get CT abdomen pelvis, they will follow up/.   3-Chronic A. fib with RVR: On IV metoprolol Hold eliquis due to leaking from wound. Hold plavix  ESRD  on peritoneal dialysis: Follow by Nephrology.  He will need to convert to hemodialysis through DC for 1 or 2 months after surgery 5/17  Nutrition Problem: Moderate Malnutrition Etiology: chronic illness(COPD, ESRD on PD)    Signs/Symptoms: energy intake < 75% for > or equal to 1 month, mild fat depletion, moderate fat depletion, mild muscle depletion, moderate muscle depletion    Interventions: TPN, Boost Breeze  Estimated body mass index is 20.72 kg/m as calculated from the following:   Height as of this encounter: 6\' 4"  (1.93 m).   Weight as of this encounter: 77.2 kg.   DVT prophylaxis: transition to eliquis.  Code Status: Full code Family Communication: care discussed with patient Disposition Plan:  Status is: Inpatient  Needs to arrange out patient HD, advancing diet post sx, on  heparin gt for A fib, plan to transition to eliquis 5/21 if tolerates diet  Dispo: The patient is from: Home               Anticipated d/c is to: Home               Anticipated d/c date is: 5/22, plan to stop TPN today. Monitor oral intake.               Patient currently ; not medical stable  Consultants:   General surgery   IR  Procedures:   none  Antimicrobials:    Subjective: Leaking serous sanguineous fluid from wound. Has had 3 small towel change  Objective: Vitals:   09/03/19 0816 09/03/19 0834 09/03/19 0856 09/03/19 1200  BP:  (!) 130/94 (!) 126/97 (!) 110/93  Pulse:  88 78 77  Resp:  20  18  Temp:  97.9 F (36.6 C) 97.7 F (36.5 C) (!) 97.3 F (36.3 C)  TempSrc:  Oral Oral Oral  SpO2: 97% 95% 96% 100%  Weight:      Height:        Intake/Output Summary (Last 24 hours) at 09/03/2019 1312 Last  data filed at 09/03/2019 0800 Gross per 24 hour  Intake 2309.68 ml  Output 300 ml  Net 2009.68 ml   Filed Weights   09/01/19 0700 09/02/19 0015 09/03/19 0419  Weight: 78.9 kg 75.9 kg 77.2 kg    Examination:  General exam: NAD Respiratory system: CTA Cardiovascular system:  S 1, S 2 RRR Gastrointestinal system: BS present, soft, nt distend, incision close with staples, leaking fluids Central nervous system: Alert Extremities: symmetric power Skin: No rashes.    Data Reviewed: I have personally reviewed following labs and imaging studies  CBC: Recent Labs  Lab 08/28/19 0324 08/30/19 0500 08/30/19 1040 08/31/19 0500 09/01/19 0625 09/02/19 0635 09/03/19 0529  WBC 10.7*   < > 12.9* 13.2* 10.5 11.6* 12.3*  NEUTROABS 9.3*  --   --   --   --   --   --   HGB 12.8*   < > 12.3* 12.6* 11.8* 11.6* 12.1*  HCT 40.0   < > 38.3* 39.4 37.4* 37.3* 37.4*  MCV 99.0   < > 99.7 99.7 98.9 111.7* 99.5  PLT 258   < > 257 250 203 188 211   < > = values in this interval not displayed.   Basic Metabolic Panel: Recent Labs  Lab 08/28/19 0324 08/28/19 0324 08/29/19 0345 08/29/19 0345 08/29/19 1103 08/30/19 1040 08/30/19 1138 08/31/19 0500 09/01/19 0625 09/03/19 0529  NA 140   < > 139   < >  --  138 138 137 135 136  K 3.2*   < > 3.2*   < >  --  3.8 3.7 3.6 3.6 4.6  CL 101   < > 96*   < >  --  98 99 98 99 99  CO2 28   < > 30   < >  --  26 26 26 25 24   GLUCOSE 135*   < > 163*   < >  --  114* 123* 92 84 80  BUN 61*   < > 49*   < >  --  55* 55* 40* 65* 68*  CREATININE 6.39*   < > 6.12*   < >  --  6.84* 6.77* 5.19* 5.98* 5.45*  CALCIUM 7.4*   < > 7.8*   < >  --  7.7* 7.5* 7.6* 7.6* 8.3*  MG 1.5*  --  1.9  --   --  1.9  --  1.8 1.8  --   PHOS 5.9*   < >  --   --  5.8* 6.5* 6.3* 3.7 3.3  --    < > = values in this interval not displayed.   GFR: Estimated Creatinine Clearance: 15.1 mL/min (A) (by C-G formula based on SCr of 5.45 mg/dL (H)). Liver Function Tests: Recent Labs  Lab  08/28/19 0324 08/28/19  2800 08/29/19 0345 08/30/19 1040 08/30/19 1138 08/31/19 0500 09/01/19 0625  AST 13*  --  15 12*  --  16 13*  ALT 9  --  9 <5  --  6 <5  ALKPHOS 78  --  81 65  --  71 59  BILITOT 0.6  --  0.6 0.7  --  0.8 0.4  PROT 5.7*  --  6.1* 5.5*  --  5.7* 5.1*  ALBUMIN 1.9*   < > 2.1* 2.2* 2.0* 2.1* 1.9*   < > = values in this interval not displayed.   No results for input(s): LIPASE, AMYLASE in the last 168 hours. No results for input(s): AMMONIA in the last 168 hours. Coagulation Profile: No results for input(s): INR, PROTIME in the last 168 hours. Cardiac Enzymes: No results for input(s): CKTOTAL, CKMB, CKMBINDEX, TROPONINI in the last 168 hours. BNP (last 3 results) No results for input(s): PROBNP in the last 8760 hours. HbA1C: No results for input(s): HGBA1C in the last 72 hours. CBG: Recent Labs  Lab 09/02/19 1128 09/02/19 1641 09/02/19 2201 09/03/19 0631 09/03/19 1154  GLUCAP 110* 105* 90 94 79   Lipid Profile: No results for input(s): CHOL, HDL, LDLCALC, TRIG, CHOLHDL, LDLDIRECT in the last 72 hours. Thyroid Function Tests: No results for input(s): TSH, T4TOTAL, FREET4, T3FREE, THYROIDAB in the last 72 hours. Anemia Panel: No results for input(s): VITAMINB12, FOLATE, FERRITIN, TIBC, IRON, RETICCTPCT in the last 72 hours. Sepsis Labs: No results for input(s): PROCALCITON, LATICACIDVEN in the last 168 hours.  No results found for this or any previous visit (from the past 240 hour(s)).       Radiology Studies: No results found.      Scheduled Meds: . apixaban  5 mg Oral BID  . atorvastatin  80 mg Oral Daily  . budesonide (PULMICORT) nebulizer solution  0.25 mg Nebulization BID  . calcium acetate  1,334 mg Oral TID WC  . Chlorhexidine Gluconate Cloth  6 each Topical Daily  . clopidogrel  75 mg Oral Daily  . feeding supplement (NEPRO CARB STEADY)  237 mL Oral BID BM  . gabapentin  300 mg Oral QHS  . gentamicin cream  1 application  Topical Daily  . insulin aspart  0-6 Units Subcutaneous TID WC  . ipratropium-albuterol  3 mL Nebulization BID  . metoprolol tartrate  5 mg Intravenous Q6H  . multivitamin  1 tablet Oral QHS  . pantoprazole (PROTONIX) IV  40 mg Intravenous Q12H  . sodium chloride flush  10-40 mL Intracatheter Q12H   Continuous Infusions:    LOS: 12 days    Time spent: 35  minutes    Belkys A Regalado, MD [facility]   If 7PM-7AM, please contact night-coverage [email or web address]  09/03/2019, 1:12 PM

## 2019-09-03 NOTE — Progress Notes (Addendum)
[facility] Progress Note     Assessment/ Plan:   1. SOB/COPD/pulmonary edema-Felt to be multifactorial - COPD/ Afib with rapid rate and volume overload. PD changes to increase ultrafiltration. S/p Steroids/inhalers/azithro.  2. ESRD-secondary to polycystic kidney disease. Has been on PD for 6 mos. followed in [place]. Albumin is low which can be problematic in PD and UF. Used4.25% PD fluid for max UF->over evning of 5/16 to 5/17 1657 ml net UF.   -Appreciate the [facility] TC 5/19 by VIR  (working much better).  Last HD 5/20 (1.9L net UF)  He's refusing  HD today; his numbers and clinically looks good so I don't think this is unreasonable especially given he's moving to possibly stop RRT as outpt once he speaks with his family outside the hospital.  If he's still here Monday then will HD Monday o/w  next HD outpt on Monday  at [facility] 2nd shift  (MWF regimen) 1145AM.   Lt sidedPICC line in place (5/17) for TPN.  Will ask HD nurse to attempt drainage of fluid from abd with PD cath; will not remove more than 2L.  3. Anemia- not an issue right now 4. Secondary hyperparathyroidism - calc OK- Phos5.8 (ok for now). We will continue to monitor; on phoslo 5. HTN/volume-continue carvedilol and diltiazem for atrial fibrillation. Ultrafiltration as above. 6. Leukocytosis:Likely associated with steroids(13.2k). Fluid studies from peritoneal fluid negative for infection. 7. Partial small bowel obstruction: Better now, tolerating PO. Surgery following and appreciate assistance; dense adhesions on 5/17. 8. Hyperdense kidney cyst: Consider further evaluation with MRI in the future.   Subjective:   Feelsokandno eventsovernight; pain in abd and draining (no pus).   Denies f/cn/v/dyspnea; +appetite.   Objective:   BP (!) 126/97 (BP Location: Right Arm)   Pulse 78   Temp 97.7 F (36.5 C) (Oral)   Resp 20   Ht 6\' 4"  (1.93 m)   Wt 77.2 kg    SpO2 96%   BMI 20.72 kg/m   Intake/Output Summary (Last 24 hours) at 09/03/2019 1287 Last data filed at 09/02/2019 2340 Gross per 24 hour  Intake 2189.68 ml  Output 300 ml  Net 1889.68 ml   Weight change: 1.3 kg  Physical Exam: General:supine inbed Heart: RRR Lungs:Coarse bilateral breath sounds, no increased work of breathing Abdomen:Distented with +BS Extremities: Trace edema Dialysis Access: PD cath,[facility] TC 5/19 Access: Lt arm PICC   Imaging: No results found.  Labs: BMET Recent Labs  Lab 08/28/19 0324 08/29/19 0345 08/29/19 1103 08/30/19 1040 08/30/19 1138 08/31/19 0500 09/01/19 0625 09/03/19 0529  NA 140 139  --  138 138 137 135 136  K 3.2* 3.2*  --  3.8 3.7 3.6 3.6 4.6  CL 101 96*  --  98 99 98 99 99  CO2 28 30  --  26 26 26 25 24   GLUCOSE 135* 163*  --  114* 123* 92 84 80  BUN 61* 49*  --  55* 55* 40* 65* 68*  CREATININE 6.39* 6.12*  --  6.84* 6.77* 5.19* 5.98* 5.45*  CALCIUM 7.4* 7.8*  --  7.7* 7.5* 7.6* 7.6* 8.3*  PHOS 5.9*  --  5.8* 6.5* 6.3* 3.7 3.3  --    CBC Recent Labs  Lab 08/28/19 0324 08/30/19 0500 08/31/19 0500 09/01/19 0625 09/02/19 0635 09/03/19 0529  WBC 10.7*   < > 13.2* 10.5 11.6* 12.3*  NEUTROABS 9.3*  --   --   --   --   --  HGB 12.8*   < > 12.6* 11.8* 11.6* 12.1*  HCT 40.0   < > 39.4 37.4* 37.3* 37.4*  MCV 99.0   < > 99.7 98.9 111.7* 99.5  PLT 258   < > 250 203 188 211   < > = values in this interval not displayed.    Medications:    . apixaban  5 mg Oral BID  . atorvastatin  80 mg Oral Daily  . budesonide (PULMICORT) nebulizer solution  0.25 mg Nebulization BID  . calcium acetate  1,334 mg Oral TID WC  . Chlorhexidine Gluconate Cloth  6 each Topical Daily  . clopidogrel  75 mg Oral Daily  . feeding supplement (NEPRO CARB STEADY)  237 mL Oral BID BM  . gabapentin  300 mg Oral QHS  . gentamicin cream  1 application Topical Daily  . insulin aspart  0-6 Units Subcutaneous TID WC  . ipratropium-albuterol  3 mL  Nebulization BID  . metoprolol tartrate  5 mg Intravenous Q6H  . multivitamin  1 tablet Oral QHS  . pantoprazole (PROTONIX) IV  40 mg Intravenous Q12H  . sodium chloride flush  10-40 mL Intracatheter Q12H      Otelia Santee, MD 09/03/2019, 9:25 AM

## 2019-09-03 NOTE — Progress Notes (Signed)
Pt coughing this morning.  Pt abdominal incision is draining serosanguinous fluid.

## 2019-09-04 ENCOUNTER — Inpatient Hospital Stay (HOSPITAL_COMMUNITY): Payer: 59

## 2019-09-04 LAB — GLUCOSE, CAPILLARY
Glucose-Capillary: 82 mg/dL (ref 70–99)
Glucose-Capillary: 68 mg/dL — ABNORMAL LOW (ref 70–99)
Glucose-Capillary: 163 mg/dL — ABNORMAL HIGH (ref 70–99)
Glucose-Capillary: 89 mg/dL (ref 70–99)
Glucose-Capillary: 67 mg/dL — ABNORMAL LOW (ref 70–99)
Glucose-Capillary: 80 mg/dL (ref 70–99)

## 2019-09-04 LAB — CBC
HCT: 36 % — ABNORMAL LOW (ref 39.0–52.0)
Hemoglobin: 11.7 g/dL — ABNORMAL LOW (ref 13.0–17.0)
MCH: 32.4 pg (ref 26.0–34.0)
MCHC: 32.5 g/dL (ref 30.0–36.0)
MCV: 99.7 fL (ref 80.0–100.0)
Platelets: 204 10*3/uL (ref 150–400)
RBC: 3.61 MIL/uL — ABNORMAL LOW (ref 4.22–5.81)
RDW: 14.9 % (ref 11.5–15.5)
WBC: 15.7 10*3/uL — ABNORMAL HIGH (ref 4.0–10.5)
nRBC: 0 % (ref 0.0–0.2)

## 2019-09-04 MED ORDER — CEFAZOLIN SODIUM-DEXTROSE 1-4 GM/50ML-% IV SOLN
1.0000 g | INTRAVENOUS | Status: DC
  Administered 2019-09-05 – 2019-09-10 (×6): 1 g via INTRAVENOUS
  Filled 2019-09-04 (×6): qty 50

## 2019-09-04 MED ORDER — DEXTROSE 50 % IV SOLN
INTRAVENOUS | Status: AC
  Administered 2019-09-04: 50 mL
  Filled 2019-09-04: qty 50

## 2019-09-04 MED ORDER — INSULIN ASPART 100 UNIT/ML ~~LOC~~ SOLN: 0.0000 [IU] | Freq: Four times a day (QID) | SUBCUTANEOUS | Status: DC

## 2019-09-04 MED ORDER — CEFAZOLIN SODIUM-DEXTROSE 1-4 GM/50ML-% IV SOLN
1.0000 g | Freq: Once | INTRAVENOUS | Status: AC
  Administered 2019-09-04: 1 g via INTRAVENOUS
  Filled 2019-09-04 (×2): qty 50

## 2019-09-04 MED ORDER — DEXTROSE 5 % IV SOLN: INTRAVENOUS | Status: DC

## 2019-09-04 NOTE — Progress Notes (Signed)
PHARMACY - TOTAL PARENTERAL NUTRITION CONSULT NOTE   Indication: Recurrent Small bowel obstruction  Patient Measurements: Height: 6\' 4"  (193 cm) Weight: 79.1 kg (174 lb 6.1 oz) IBW/kg (Calculated) : 86.8 TPN AdjBW (KG): 81.5 Body mass index is 21.23 kg/m. Usual Weight: 180 lbs  Assessment:  64 yo M admitted for COPD exacerbation and afib who developed SBO on 08/25/19 however this resolved and the patient had a BM on 5/21. The patient tolerated po intake well 5/20-5/21 and TPN was stopped on 5/21. The patient has had increased belching, minimal flatus, and concern for recurrent pSBO based on 5/22 Abd CT per surgery. Pharmacy has been consulted to resume TPN.   Given the short duration of po intake, concern for recurrent pSBO, it seems reasonable to resume TPN however order placed after 12 noon so will default to earliest start on 5/24. MD informed and aware. Will order TPN labs for tomorrow AM and plan to address TPN restart at that time.   Glucose / Insulin: A1c 5.9, CBGs <150 Electrolytes: Labs -  K 4.6, Na 136 Renal: ESRD on PD, switch to HD per Gen Surg due to suboptimal dialysis on PD and abdominal surgery LFTs / TGs: Wnl, TGs 63  Prealbumin / albumin: Albumin 2.1  Intake / Output; MIVF: LBM 5/21, minimal flatus.  GI Imaging: Per surgery: "suggestion of pSBO on CT A/P 5/22"  Surgeries / Procedures:  5/17 ex lap - lysis of adhesions, small bowel serosal repair x3, resection of mesenteric nodule  Central access: PICC line 08/29/19 TPN start date: 08/29/19  Nutritional Goals (RD recommendation 5/21): Kcal:  [id number]; Protein:  125-140 grams Fluid: minimize for ESRD  Goal TPN rate is 75 mL/hr (provides 126g of protein, 360g dextrose, 72g lipids and 2448 kcals per day)  Current Nutrition:  NPO - D5W @ 35 cc/hr  Plan:  - No TPN orders today due to consult entered after 1200 - MD informed   - Will order TPN labs and address restart on 5/24  Thank you for allowing pharmacy to be  a part of this patient's care.  Alycia Rossetti, PharmD, BCPS Clinical Pharmacist Clinical phone for 09/04/2019: [phone] 09/04/2019 2:18 PM   **Pharmacist phone directory can now be found on [email or web address] ([email or web address]).  Listed under [place].

## 2019-09-04 NOTE — Progress Notes (Signed)
PROGRESS NOTE    TAMI BARREN  IRJ:[id number] DOB: [birth year] DOA: 08/22/2019 PCP: Gwenlyn Saran [place]   Brief Narrative: 64 year old with past medical history significant for hypertension, hyperlipidemia, chronic A. fib on Eliquis, ESRD on peritoneal dialysis presented to emergency department for evaluation of shortness of breath.  Patient does have a history of COPD, he recently quit smoking few months ago.  He does have some wheezing at baseline.  Came to the ER with worsening shortness of breath. In the emergency room, he was comfortable, found to have A. fib with RVR started on Cardizem drip.  [facility] to [name] from [facility].  On 5/13 patient is started having intermittent biliary vomiting.  CT scan showed a small bowel obstruction.  Surgery consulted in the morning of 514.  On 517 no improvement in the small bowel obstruction.  Patient went for exploratory laparotomy by surgery.  On 518 PICC line for TPN.  Right IJ line temporary catheter for hemodialysis.  Right IJ for hemodialysis not working, and to proceed with IR for tunneled catheter.  Assessment & Plan:   Principal Problem:   COPD with acute exacerbation ([place]) Active Problems:   Essential hypertension   ESRD on dialysis ([place])   Exertional dyspnea   Acute exacerbation of CHF (congestive heart failure) (HCC)   Atrial fibrillation with RVR (HCC)   Malnutrition of moderate degree   Atrial fibrillation with rapid ventricular response (HCC)   ESRD on peritoneal dialysis ([place])   SBO (small bowel obstruction) ([place])   DNR (do not resuscitate)  1-COPD with acute exacerbation: Improved Continue with bronchodilators as needed.  Off of oxygen.  2-Small bowel obstruction secondary to adhesion: No improvement with conservative management.  Status post exploratory laparotomy Holding  Eliquis and Plavix since 5/13 Treated with  TPN Had small watery stool on 09/01/2019 Leaking serous  sanguineous fluid from wound. CT negative for large fascial defect. Showed sign of SBO.  NPO, surgery recommend dry dressing changes, abdominal binder, rest with bathroom privilage.  CT concern for pSBO 5-22. NPO sisp with meds.  Will order TPN.   3-Chronic A. fib with RVR: On IV metoprolol Hold eliquis due to leaking from wound. Hold plavix  ESRD  on peritoneal dialysis: Follow by Nephrology.  He will need to convert to hemodialysis through DC for 1 or 2 months after surgery 5/17  Cough;  Leukocytosis. Start ancef.  ? Cellulitis wound,  Check chest  Ray;   Nutrition Problem: Moderate Malnutrition Etiology: chronic illness(COPD, ESRD on PD)    Signs/Symptoms: energy intake < 75% for > or equal to 1 month, mild fat depletion, moderate fat depletion, mild muscle depletion, moderate muscle depletion    Interventions: TPN, Boost Breeze  Estimated body mass index is 21.23 kg/m as calculated from the following:   Height as of this encounter: 6\' 4"  (1.93 m).   Weight as of this encounter: 79.1 kg.   DVT prophylaxis: transition to eliquis.  Code Status: Full code Family Communication: care discussed with patient Disposition Plan:  Status is: Inpatient  Leaking fluid from wound, need further observation, ? SBO on CT   Dispo: The patient is from: Home               Anticipated d/c is to: Home               Anticipated d/c date is: 2-3 days.  Patient currently ; not medical stable         Consultants:   General surgery   IR  Procedures:   none  Antimicrobials:    Subjective: Still leaking fluids, has abdominal binder.  No BM in 48 hours.   Objective: Vitals:   09/04/19 0345 09/04/19 0746 09/04/19 0811 09/04/19 0813  BP: (!) 120/96 130/85    Pulse: (!) 105 90    Resp: 16 20    Temp: 98.4 F (36.9 C) (!) 97.5 F (36.4 C)    TempSrc: Oral Oral    SpO2: 96% 98% 98% 98%  Weight:      Height:        Intake/Output Summary (Last 24  hours) at 09/04/2019 0918 Last data filed at 09/03/2019 1939 Gross per 24 hour  Intake --  Output 175 ml  Net -175 ml   Filed Weights   09/02/19 0015 09/03/19 0419 09/04/19 0026  Weight: 75.9 kg 77.2 kg 79.1 kg    Examination:  General exam: NAD Respiratory system:  CTA Cardiovascular system:  S 1, S  2 RRR Gastrointestinal system: BS decreased, distend, incision cover dressing and abdominal binder.  Central nervous system: Alert Extremities: no edema Skin: No rashes.    Data Reviewed: I have personally reviewed following labs and imaging studies  CBC: Recent Labs  Lab 08/31/19 0500 09/01/19 0625 09/02/19 0635 09/03/19 0529 09/04/19 0518  WBC 13.2* 10.5 11.6* 12.3* 15.7*  HGB 12.6* 11.8* 11.6* 12.1* 11.7*  HCT 39.4 37.4* 37.3* 37.4* 36.0*  MCV 99.7 98.9 111.7* 99.5 99.7  PLT 250 203 188 211 419   Basic Metabolic Panel: Recent Labs  Lab 08/29/19 0345 08/29/19 0345 08/29/19 1103 08/30/19 1040 08/30/19 1138 08/31/19 0500 09/01/19 0625 09/03/19 0529  NA 139   < >  --  138 138 137 135 136  K 3.2*   < >  --  3.8 3.7 3.6 3.6 4.6  CL 96*   < >  --  98 99 98 99 99  CO2 30   < >  --  26 26 26 25 24   GLUCOSE 163*   < >  --  114* 123* 92 84 80  BUN 49*   < >  --  55* 55* 40* 65* 68*  CREATININE 6.12*   < >  --  6.84* 6.77* 5.19* 5.98* 5.45*  CALCIUM 7.8*   < >  --  7.7* 7.5* 7.6* 7.6* 8.3*  MG 1.9  --   --  1.9  --  1.8 1.8  --   PHOS  --   --  5.8* 6.5* 6.3* 3.7 3.3  --    < > = values in this interval not displayed.   GFR: Estimated Creatinine Clearance: 15.5 mL/min (A) (by C-G formula based on SCr of 5.45 mg/dL (H)). Liver Function Tests: Recent Labs  Lab 08/29/19 0345 08/30/19 1040 08/30/19 1138 08/31/19 0500 09/01/19 0625  AST 15 12*  --  16 13*  ALT 9 <5  --  6 <5  ALKPHOS 81 65  --  71 59  BILITOT 0.6 0.7  --  0.8 0.4  PROT 6.1* 5.5*  --  5.7* 5.1*  ALBUMIN 2.1* 2.2* 2.0* 2.1* 1.9*   No results for input(s): LIPASE, AMYLASE in the last 168  hours. No results for input(s): AMMONIA in the last 168 hours. Coagulation Profile: No results for input(s): INR, PROTIME in the last 168 hours. Cardiac Enzymes: No results for input(s): CKTOTAL, CKMB,  CKMBINDEX, TROPONINI in the last 168 hours. BNP (last 3 results) No results for input(s): PROBNP in the last 8760 hours. HbA1C: No results for input(s): HGBA1C in the last 72 hours. CBG: Recent Labs  Lab 09/03/19 0631 09/03/19 1154 09/03/19 2119 09/03/19 2308 09/04/19 0602  GLUCAP 94 79 75 84 82   Lipid Profile: No results for input(s): CHOL, HDL, LDLCALC, TRIG, CHOLHDL, LDLDIRECT in the last 72 hours. Thyroid Function Tests: No results for input(s): TSH, T4TOTAL, FREET4, T3FREE, THYROIDAB in the last 72 hours. Anemia Panel: No results for input(s): VITAMINB12, FOLATE, FERRITIN, TIBC, IRON, RETICCTPCT in the last 72 hours. Sepsis Labs: No results for input(s): PROCALCITON, LATICACIDVEN in the last 168 hours.  No results found for this or any previous visit (from the past 240 hour(s)).       Radiology Studies: CT ABDOMEN PELVIS W CONTRAST  Addendum Date: 09/03/2019   ADDENDUM REPORT: 09/03/2019 18:02 ADDENDUM: It should be noted that upon further evaluation, patient has a surgical incision site along the midline of the abdomen. A mild amount of subcutaneous inflammatory fat stranding is seen without evidence of an organized fluid collection or abscess. A few small foci of air density is also seen within the subcutaneous fat. A mild amount of intra-abdominal free air is seen consistent with the patient's recent abdominal surgical intervention. Results were discussed with Dr. Tyrell Antonio at 5:59 p.m. [place] on Sep 03, 2019. Electronically Signed   By: Virgina Norfolk M.D.   On: 09/03/2019 18:02   Result Date: 09/03/2019 CLINICAL DATA:  Evaluate for small bowel obstruction. EXAM: CT ABDOMEN AND PELVIS WITH CONTRAST TECHNIQUE: Multidetector CT imaging of the abdomen and pelvis was  performed using the standard protocol following bolus administration of intravenous contrast. CONTRAST:  129mL OMNIPAQUE IOHEXOL 300 MG/ML  SOLN COMPARISON:  Aug 25, 2019 FINDINGS: Lower chest: Mild atelectasis and/or early infiltrate is seen within the posterior aspect of the right lung base. There is a small anterior pericardial effusion. Hepatobiliary: Innumerable subcentimeter cysts are seen scattered throughout the liver parenchyma. No gallstones, gallbladder wall thickening, or biliary dilatation. Pancreas: Unremarkable. No pancreatic ductal dilatation or surrounding inflammatory changes. Spleen: Normal in size without focal abnormality. Adrenals/Urinary Tract: Adrenal glands are unremarkable. Innumerable cysts of various sizes are seen within both kidneys. A 5.7 cm x 4.6 cm well-defined heterogeneous mildly increased attenuation lesion is seen within the anteromedial aspect of the mid left kidney. A mild amount of air is seen within the lumen of an otherwise normal appearing urinary bladder. Stomach/Bowel: Stomach is within normal limits. The appendix is not clearly identified. Numerous dilated small bowel loops are seen throughout the mid and lower left abdomen (maximum small bowel diameter of approximately 4.4 cm). A transition zone is seen within the left lower quadrant. Noninflamed diverticula are seen throughout the large bowel. Vascular/Lymphatic: There is marked severity aortic calcification. No enlarged abdominal or pelvic lymph nodes. Reproductive: The prostate gland is mildly enlarged. Other: The distal portion of a ventriculoperitoneal shunt is seen with its distal tip noted within the pelvis. Musculoskeletal: Marked severity multilevel degenerative changes seen throughout the lumbar spine. Additional findings suggestive of prior discitis/osteomyelitis is seen at the level of L1-L2. IMPRESSION: 1. Findings consistent with a partial small bowel obstruction with a transition zone seen within the left  lower quadrant. 2. Innumerable hepatic and renal cysts consistent with polycystic kidney disease. 3. Colonic diverticulosis. 4. Mild atelectasis and/or early infiltrate within the posterior aspect of the right lung base. Aortic Atherosclerosis (ICD10-I70.0). Electronically  Signed: By: Virgina Norfolk M.D. On: 09/03/2019 17:55        Scheduled Meds: . atorvastatin  80 mg Oral Daily  . budesonide (PULMICORT) nebulizer solution  0.25 mg Nebulization BID  . calcium acetate  1,334 mg Oral TID WC  . Chlorhexidine Gluconate Cloth  6 each Topical Daily  . feeding supplement (NEPRO CARB STEADY)  237 mL Oral BID BM  . gabapentin  300 mg Oral QHS  . gentamicin cream  1 application Topical Daily  . insulin aspart  0-6 Units Subcutaneous TID WC  . ipratropium-albuterol  3 mL Nebulization BID  . metoprolol tartrate  5 mg Intravenous Q6H  . multivitamin  1 tablet Oral QHS  . pantoprazole (PROTONIX) IV  40 mg Intravenous Q12H  . sodium chloride flush  10-40 mL Intracatheter Q12H   Continuous Infusions:    LOS: 13 days    Time spent: 35  minutes    Diamonique Ruedas A Crislyn Willbanks, MD [facility]   If 7PM-7AM, please contact night-coverage [email or web address]  09/04/2019, 9:18 AM

## 2019-09-04 NOTE — Progress Notes (Signed)
Pharmacy Antibiotic Note  Aaron [name] is a 64 y.o. male admitted on 08/22/2019 with cellulitis.  Pharmacy has been consulted for cefazolin dosing.  Fluid leaking around wound site. WBC increasing to 15.7, afebrile.   Pt is ESRD. He has been on PD x6 months PTA. Last HD session 5/20. Pt refused HD on 5/22. Next HD session planned for 5/24 as an outpatient. Urine output minimal.   Plan: Start cefazolin 1g IV Q24h for now. Can convert to 2g IV qHD once HD schedule stable F/u clinical progress, c/s, and LOT  Height: 6\' 4"  (193 cm) Weight: 79.1 kg (174 lb 6.1 oz) IBW/kg (Calculated) : 86.8  Temp (24hrs), Avg:97.8 F (36.6 C), Min:97.3 F (36.3 C), Max:98.4 F (36.9 C)  Recent Labs  Lab 08/30/19 1040 08/30/19 1040 08/30/19 1138 08/31/19 0500 09/01/19 0625 09/02/19 0635 09/03/19 0529 09/04/19 0518  WBC 12.9*   < >  --  13.2* 10.5 11.6* 12.3* 15.7*  CREATININE 6.84*  --  6.77* 5.19* 5.98*  --  5.45*  --    < > = values in this interval not displayed.    Estimated Creatinine Clearance: 15.5 mL/min (A) (by C-G formula based on SCr of 5.45 mg/dL (H)).    No Known Allergies  Antimicrobials this admission: Azithro 5/10>>5/14 Ancef 5/23>>  Dose adjustments this admission: N/A  Microbiology results: 5/10 BCx: no growth final  Thank you for allowing pharmacy to be a part of this patient's care.  Kennon Holter, PharmD PGY1 Ambulatory Care Pharmacy Resident 09/04/2019 9:32 AM

## 2019-09-04 NOTE — Progress Notes (Signed)
[facility] Progress Note     Assessment/ Plan:   1. SOB/COPD/pulmonary edema-Felt to be multifactorial - COPD/ Afib with rapid rate and volume overload. PD changes to increase ultrafiltration. S/p Steroids/inhalers/azithro.  2. ESRD-secondary to polycystic kidney disease. Has been on PD for 6 mos. followed in [place]. Albumin is low which can be problematic in PD and UF.   -Appreciate the [facility] TC 5/19 by VIR  (working much better).  LastHD5/20 (1.9L net UF); refused HD Sat;  his numbers and clinically looks good so I don't think this is unreasonable especially given he's moving to possibly stop RRT as outpt once he speaks with his family outside the hospital.  If he's still here Monday then will HD Monday o/w  next HD outpt on Monday  at [facility] 2nd shift (MWF regimen) 1145AM.  Unfortunately unable to drain any peritoneal fluid 5/22 evening which is not unusual when a cath is not routinely being used.  Lt sidedPICC line in place (5/17) for TPN initially but now leaving in for medication or blood draws given he's return soon for an abdominal wound check.  3. Anemia- not an issue right now 4. Secondary hyperparathyroidism - calc OK- Phos5.8 (ok for now). We will continue to monitor; on phoslo 5. HTN/volume-continue carvedilol and diltiazem for atrial fibrillation. Ultrafiltration as above. 6. Leukocytosis:Likely associated with steroids(13.2k). Fluid studies from peritoneal fluid negative for infection. 7. Partial small bowel obstruction: Better now, tolerating PO. Surgery following and appreciate assistance; dense adhesions on 5/17. 8. Hyperdense kidney cyst: Consider further evaluation with MRI in the future.  Subjective:   Feelsokandno eventsovernight; mild pain in abd and serous draining in bandage    Denies f/cn/v/dyspnea; +appetite (eating 75% of meal).   Objective:   BP 130/85 (BP Location: Right Arm)   Pulse 90   Temp  (!) 97.5 F (36.4 C) (Oral)   Resp 20   Ht 6\' 4"  (1.93 m)   Wt 79.1 kg   SpO2 98%   BMI 21.23 kg/m   Intake/Output Summary (Last 24 hours) at 09/04/2019 0813 Last data filed at 09/03/2019 1939 Gross per 24 hour  Intake --  Output 175 ml  Net -175 ml   Weight change: 1.9 kg  Physical Exam: General:supine inbed Heart:RRR Lungs:Coarse bilateral breath sounds, no increased work of breathing Abdomen:Distented with +BS Extremities: Trace edema Dialysis Access: PD cath,[facility] TC 5/19 Access: Lt arm PICC  Imaging: CT ABDOMEN PELVIS W CONTRAST  Addendum Date: 09/03/2019   ADDENDUM REPORT: 09/03/2019 18:02 ADDENDUM: It should be noted that upon further evaluation, patient has a surgical incision site along the midline of the abdomen. A mild amount of subcutaneous inflammatory fat stranding is seen without evidence of an organized fluid collection or abscess. A few small foci of air density is also seen within the subcutaneous fat. A mild amount of intra-abdominal free air is seen consistent with the patient's recent abdominal surgical intervention. Results were discussed with Dr. Tyrell Antonio at 5:59 p.m. [place] on Sep 03, 2019. Electronically Signed   By: Virgina Norfolk M.D.   On: 09/03/2019 18:02   Result Date: 09/03/2019 CLINICAL DATA:  Evaluate for small bowel obstruction. EXAM: CT ABDOMEN AND PELVIS WITH CONTRAST TECHNIQUE: Multidetector CT imaging of the abdomen and pelvis was performed using the standard protocol following bolus administration of intravenous contrast. CONTRAST:  180mL OMNIPAQUE IOHEXOL 300 MG/ML  SOLN COMPARISON:  Aug 25, 2019 FINDINGS: Lower chest: Mild atelectasis and/or early infiltrate is seen within the posterior aspect  of the right lung base. There is a small anterior pericardial effusion. Hepatobiliary: Innumerable subcentimeter cysts are seen scattered throughout the liver parenchyma. No gallstones, gallbladder wall thickening, or biliary dilatation. Pancreas:  Unremarkable. No pancreatic ductal dilatation or surrounding inflammatory changes. Spleen: Normal in size without focal abnormality. Adrenals/Urinary Tract: Adrenal glands are unremarkable. Innumerable cysts of various sizes are seen within both kidneys. A 5.7 cm x 4.6 cm well-defined heterogeneous mildly increased attenuation lesion is seen within the anteromedial aspect of the mid left kidney. A mild amount of air is seen within the lumen of an otherwise normal appearing urinary bladder. Stomach/Bowel: Stomach is within normal limits. The appendix is not clearly identified. Numerous dilated small bowel loops are seen throughout the mid and lower left abdomen (maximum small bowel diameter of approximately 4.4 cm). A transition zone is seen within the left lower quadrant. Noninflamed diverticula are seen throughout the large bowel. Vascular/Lymphatic: There is marked severity aortic calcification. No enlarged abdominal or pelvic lymph nodes. Reproductive: The prostate gland is mildly enlarged. Other: The distal portion of a ventriculoperitoneal shunt is seen with its distal tip noted within the pelvis. Musculoskeletal: Marked severity multilevel degenerative changes seen throughout the lumbar spine. Additional findings suggestive of prior discitis/osteomyelitis is seen at the level of L1-L2. IMPRESSION: 1. Findings consistent with a partial small bowel obstruction with a transition zone seen within the left lower quadrant. 2. Innumerable hepatic and renal cysts consistent with polycystic kidney disease. 3. Colonic diverticulosis. 4. Mild atelectasis and/or early infiltrate within the posterior aspect of the right lung base. Aortic Atherosclerosis (ICD10-I70.0). Electronically Signed: By: Virgina Norfolk M.D. On: 09/03/2019 17:55    Labs: BMET Recent Labs  Lab 08/29/19 0345 08/29/19 1103 08/30/19 1040 08/30/19 1138 08/31/19 0500 09/01/19 0625 09/03/19 0529  NA 139  --  138 138 137 135 136  K 3.2*  --   3.8 3.7 3.6 3.6 4.6  CL 96*  --  98 99 98 99 99  CO2 30  --  26 26 26 25 24   GLUCOSE 163*  --  114* 123* 92 84 80  BUN 49*  --  55* 55* 40* 65* 68*  CREATININE 6.12*  --  6.84* 6.77* 5.19* 5.98* 5.45*  CALCIUM 7.8*  --  7.7* 7.5* 7.6* 7.6* 8.3*  PHOS  --  5.8* 6.5* 6.3* 3.7 3.3  --    CBC Recent Labs  Lab 09/01/19 0625 09/02/19 0635 09/03/19 0529 09/04/19 0518  WBC 10.5 11.6* 12.3* 15.7*  HGB 11.8* 11.6* 12.1* 11.7*  HCT 37.4* 37.3* 37.4* 36.0*  MCV 98.9 111.7* 99.5 99.7  PLT 203 188 211 204    Medications:    . atorvastatin  80 mg Oral Daily  . budesonide (PULMICORT) nebulizer solution  0.25 mg Nebulization BID  . calcium acetate  1,334 mg Oral TID WC  . Chlorhexidine Gluconate Cloth  6 each Topical Daily  . feeding supplement (NEPRO CARB STEADY)  237 mL Oral BID BM  . gabapentin  300 mg Oral QHS  . gentamicin cream  1 application Topical Daily  . insulin aspart  0-6 Units Subcutaneous TID WC  . ipratropium-albuterol  3 mL Nebulization BID  . metoprolol tartrate  5 mg Intravenous Q6H  . multivitamin  1 tablet Oral QHS  . pantoprazole (PROTONIX) IV  40 mg Intravenous Q12H  . sodium chloride flush  10-40 mL Intracatheter Q12H      Otelia Santee, MD 09/04/2019, 8:13 AM

## 2019-09-04 NOTE — Progress Notes (Signed)
[facility] Progress Note  6 Days Post-Op  Subjective: Large volume of SS fluid leakage from staples yeterday. CT A/P with no large fascial defect appreciated. Patient with increased belching and no BM for 48+ hours.   Objective: Vital signs in last 24 hours: Temp:  [97.5 F (36.4 C)-98.4 F (36.9 C)] 97.5 F (36.4 C) (05/23 0746) Pulse Rate:  [75-105] 90 (05/23 0746) Resp:  [15-20] 20 (05/23 0746) BP: (120-130)/(82-96) 130/85 (05/23 0746) SpO2:  [96 %-98 %] 98 % (05/23 0813) FiO2 (%):  [21 %] 21 % (05/23 0813) Weight:  [79.1 kg] 79.1 kg (05/23 0026) Last BM Date: 09/02/19  Intake/Output from previous day: 05/22 0701 - 05/23 0700 In: 240 [P.O.:240] Out: 175 [Urine:175] Intake/Output this shift: No intake/output data recorded.  PE: General: chronically ill appearing white male who is sitting in bed in NAD Heart: regular, rate, and rhythm.. No obvious murmurs, gallops, or rubs noted.  Palpable radial and pedal pulses bilaterally Lungs: CTAB.  Respiratory effort nonlabored Abd: soft, protuberant, appropriately tender, staples are in tact with SS drainge slowly leaking from central aspect of incision, gentle probing of the mid-inferior aspect of the wound allowed entrant into the peritoneal cavity however skin remained in tact/closed. Dressing and binder replaced.  Lab Results:  Recent Labs    09/03/19 0529 09/04/19 0518  WBC 12.3* 15.7*  HGB 12.1* 11.7*  HCT 37.4* 36.0*  PLT 211 204   BMET Recent Labs    09/03/19 0529  NA 136  K 4.6  CL 99  CO2 24  GLUCOSE 80  BUN 68*  CREATININE 5.45*  CALCIUM 8.3*   PT/INR No results for input(s): LABPROT, INR in the last 72 hours. CMP     Component Value Date/Time   NA 136 09/03/2019 0529   K 4.6 09/03/2019 0529   CL 99 09/03/2019 0529   CO2 24 09/03/2019 0529   GLUCOSE 80 09/03/2019 0529   BUN 68 (H) 09/03/2019 0529   CREATININE 5.45 (H) 09/03/2019 0529   CALCIUM 8.3 (L) 09/03/2019 0529   PROT 5.1  (L) 09/01/2019 0625   ALBUMIN 1.9 (L) 09/01/2019 0625   AST 13 (L) 09/01/2019 0625   ALT <5 09/01/2019 0625   ALKPHOS 59 09/01/2019 0625   BILITOT 0.4 09/01/2019 0625   GFRNONAA 10 (L) 09/03/2019 0529   GFRAA 12 (L) 09/03/2019 0529   Lipase     Component Value Date/Time   LIPASE 23 08/25/2019 1740       Studies/Results: CT ABDOMEN PELVIS W CONTRAST  Addendum Date: 09/03/2019   ADDENDUM REPORT: 09/03/2019 18:02 ADDENDUM: It should be noted that upon further evaluation, patient has a surgical incision site along the midline of the abdomen. A mild amount of subcutaneous inflammatory fat stranding is seen without evidence of an organized fluid collection or abscess. A few small foci of air density is also seen within the subcutaneous fat. A mild amount of intra-abdominal free air is seen consistent with the patient's recent abdominal surgical intervention. Results were discussed with Dr. Tyrell Antonio at 5:59 p.m. [place] on Sep 03, 2019. Electronically Signed   By: Virgina Norfolk M.D.   On: 09/03/2019 18:02   Result Date: 09/03/2019 CLINICAL DATA:  Evaluate for small bowel obstruction. EXAM: CT ABDOMEN AND PELVIS WITH CONTRAST TECHNIQUE: Multidetector CT imaging of the abdomen and pelvis was performed using the standard protocol following bolus administration of intravenous contrast. CONTRAST:  111mL OMNIPAQUE IOHEXOL 300 MG/ML  SOLN COMPARISON:  Aug 25, 2019 FINDINGS: Lower  chest: Mild atelectasis and/or early infiltrate is seen within the posterior aspect of the right lung base. There is a small anterior pericardial effusion. Hepatobiliary: Innumerable subcentimeter cysts are seen scattered throughout the liver parenchyma. No gallstones, gallbladder wall thickening, or biliary dilatation. Pancreas: Unremarkable. No pancreatic ductal dilatation or surrounding inflammatory changes. Spleen: Normal in size without focal abnormality. Adrenals/Urinary Tract: Adrenal glands are unremarkable. Innumerable  cysts of various sizes are seen within both kidneys. A 5.7 cm x 4.6 cm well-defined heterogeneous mildly increased attenuation lesion is seen within the anteromedial aspect of the mid left kidney. A mild amount of air is seen within the lumen of an otherwise normal appearing urinary bladder. Stomach/Bowel: Stomach is within normal limits. The appendix is not clearly identified. Numerous dilated small bowel loops are seen throughout the mid and lower left abdomen (maximum small bowel diameter of approximately 4.4 cm). A transition zone is seen within the left lower quadrant. Noninflamed diverticula are seen throughout the large bowel. Vascular/Lymphatic: There is marked severity aortic calcification. No enlarged abdominal or pelvic lymph nodes. Reproductive: The prostate gland is mildly enlarged. Other: The distal portion of a ventriculoperitoneal shunt is seen with its distal tip noted within the pelvis. Musculoskeletal: Marked severity multilevel degenerative changes seen throughout the lumbar spine. Additional findings suggestive of prior discitis/osteomyelitis is seen at the level of L1-L2. IMPRESSION: 1. Findings consistent with a partial small bowel obstruction with a transition zone seen within the left lower quadrant. 2. Innumerable hepatic and renal cysts consistent with polycystic kidney disease. 3. Colonic diverticulosis. 4. Mild atelectasis and/or early infiltrate within the posterior aspect of the right lung base. Aortic Atherosclerosis (ICD10-I70.0). Electronically Signed: By: Virgina Norfolk M.D. On: 09/03/2019 17:55    Anti-infectives: Anti-infectives (From admission, onward)   Start     Dose/Rate Route Frequency Ordered Stop   09/05/19 1800  ceFAZolin (ANCEF) IVPB 1 g/50 mL premix     1 g 100 mL/hr over 30 Minutes Intravenous Every 24 hours 09/04/19 0939     09/04/19 1030  ceFAZolin (ANCEF) IVPB 1 g/50 mL premix     1 g 100 mL/hr over 30 Minutes Intravenous  Once 09/04/19 0939      08/31/19 1415  ceFAZolin (ANCEF) IVPB 2g/100 mL premix     2 g 200 mL/hr over 30 Minutes Intravenous  Once 08/31/19 1328 08/31/19 1358   08/31/19 1400  ceFAZolin (ANCEF) powder 2 g  Status:  Discontinued     2 g Other To Surgery 08/31/19 1314 08/31/19 1326   08/29/19 1145  ceFAZolin (ANCEF) IVPB 2g/100 mL premix     2 g 200 mL/hr over 30 Minutes Intravenous To ShortStay Surgical 08/29/19 1127 08/29/19 1445   08/23/19 2200  azithromycin (ZITHROMAX) tablet 500 mg  Status:  Discontinued    Note to Pharmacy: Pharmacy to adjust timing for next dose.   500 mg Oral Daily at bedtime 08/23/19 0233 08/25/19 2046   08/22/19 2130  azithromycin (ZITHROMAX) 500 mg in sodium chloride 0.9 % 250 mL IVPB     500 mg 250 mL/hr over 60 Minutes Intravenous  Once 08/22/19 2122 08/22/19 2245       Assessment/Plan CHF COPD A fib  Polycystic kidney disease, ESRD, on PD -  converted to HD temporarily H/O MI - plavix on hold as of 5/13 CAD Hep C HLD  SBO POD#6 exploratory laparotomy, LOA, repair of SB serosal injury x3, resection of mesenteric nodule 08/29/19 Dr. Bobbye Morton - AFVSS, WBC 15.7  - now having  belching, minimal flatus, suggestion of pSBO on CT A/P 5/22 - NPO sips with meds - leakage of SS via staples - continue dry dressing changes PRN, abdominal binder, and would recommend bed rest with bathroom privileges and observation for possible small fascial disruption.  - OOB/mobilize, PT/OT - IS 10x q 1h   FEN - NPO, sips with meds VTE - SCDs, Eliquis held 5/22 continue to hold ID -Ancef for cellulitis     LOS: 13 days     Jill Alexanders , [facility] 09/04/2019, 12:12 PM Please see Amion for pager number during day hours 7:00am-4:30pm   LOS: 13 days    Jill Alexanders 09/04/2019

## 2019-09-04 NOTE — Progress Notes (Signed)
Cbg 68 rechecked after 1 amp D50 and it 177ml/dl.

## 2019-09-05 ENCOUNTER — Encounter (HOSPITAL_COMMUNITY)
Admission: EM | Disposition: A | Discharge status: Home Health | Payer: Self-pay | Source: Home / Self Care | Attending: Internal Medicine

## 2019-09-05 ENCOUNTER — Inpatient Hospital Stay (HOSPITAL_COMMUNITY): Payer: 59 | Admitting: Certified Registered Nurse Anesthetist

## 2019-09-05 ENCOUNTER — Encounter (HOSPITAL_COMMUNITY): Payer: Self-pay | Admitting: Internal Medicine

## 2019-09-05 HISTORY — PX: IRRIGATION AND DEBRIDEMENT ABDOMEN: SHX6600

## 2019-09-05 HISTORY — PX: LAPAROTOMY: SHX154

## 2019-09-05 LAB — COMPREHENSIVE METABOLIC PANEL
ALT: 6 U/L (ref 0–44)
AST: 14 U/L — ABNORMAL LOW (ref 15–41)
Albumin: 1.8 g/dL — ABNORMAL LOW (ref 3.5–5.0)
Alkaline Phosphatase: 78 U/L (ref 38–126)
Anion gap: 16 — ABNORMAL HIGH (ref 5–15)
BUN: 93 mg/dL — ABNORMAL HIGH (ref 8–23)
CO2: 20 mmol/L — ABNORMAL LOW (ref 22–32)
Calcium: 7.9 mg/dL — ABNORMAL LOW (ref 8.9–10.3)
Chloride: 93 mmol/L — ABNORMAL LOW (ref 98–111)
Creatinine, Ser: 7.1 mg/dL — ABNORMAL HIGH (ref 0.61–1.24)
GFR calc Af Amer: 9 mL/min — ABNORMAL LOW (ref >60)
GFR calc non Af Amer: 7 mL/min — ABNORMAL LOW (ref >60)
Glucose, Bld: 95 mg/dL (ref 70–99)
Potassium: 5.1 mmol/L (ref 3.5–5.1)
Sodium: 129 mmol/L — ABNORMAL LOW (ref 135–145)
Total Bilirubin: 0.7 mg/dL (ref 0.3–1.2)
Total Protein: 5.3 g/dL — ABNORMAL LOW (ref 6.5–8.1)

## 2019-09-05 LAB — DIFFERENTIAL
Abs Immature Granulocytes: 0.14 10*3/uL — ABNORMAL HIGH (ref 0.00–0.07)
Basophils Absolute: 0.1 10*3/uL (ref 0.0–0.1)
Basophils Relative: 0 %
Eosinophils Absolute: 0 10*3/uL (ref 0.0–0.5)
Eosinophils Relative: 0 %
Immature Granulocytes: 1 %
Lymphocytes Relative: 4 %
Lymphs Abs: 0.9 10*3/uL (ref 0.7–4.0)
Monocytes Absolute: 1 10*3/uL (ref 0.1–1.0)
Monocytes Relative: 5 %
Neutro Abs: 20.1 10*3/uL — ABNORMAL HIGH (ref 1.7–7.7)
Neutrophils Relative %: 90 %

## 2019-09-05 LAB — CBC
HCT: 34 % — ABNORMAL LOW (ref 39.0–52.0)
Hemoglobin: 11.1 g/dL — ABNORMAL LOW (ref 13.0–17.0)
MCH: 32.7 pg (ref 26.0–34.0)
MCHC: 32.6 g/dL (ref 30.0–36.0)
MCV: 100.3 fL — ABNORMAL HIGH (ref 80.0–100.0)
Platelets: 219 10*3/uL (ref 150–400)
RBC: 3.39 MIL/uL — ABNORMAL LOW (ref 4.22–5.81)
RDW: 14.7 % (ref 11.5–15.5)
WBC: 22.2 10*3/uL — ABNORMAL HIGH (ref 4.0–10.5)
nRBC: 0 % (ref 0.0–0.2)

## 2019-09-05 LAB — GLUCOSE, CAPILLARY
Glucose-Capillary: 111 mg/dL — ABNORMAL HIGH (ref 70–99)
Glucose-Capillary: 137 mg/dL — ABNORMAL HIGH (ref 70–99)
Glucose-Capillary: 87 mg/dL (ref 70–99)
Glucose-Capillary: 89 mg/dL (ref 70–99)
Glucose-Capillary: 99 mg/dL (ref 70–99)

## 2019-09-05 LAB — TRIGLYCERIDES: Triglycerides: 96 mg/dL (ref <150)

## 2019-09-05 LAB — PHOSPHORUS: Phosphorus: 4.6 mg/dL (ref 2.5–4.6)

## 2019-09-05 LAB — PREALBUMIN: Prealbumin: 12.5 mg/dL — ABNORMAL LOW (ref 18–38)

## 2019-09-05 LAB — MAGNESIUM: Magnesium: 1.7 mg/dL (ref 1.7–2.4)

## 2019-09-05 SURGERY — LAPAROTOMY, EXPLORATORY
Anesthesia: General | Site: Abdomen

## 2019-09-05 MED ORDER — FENTANYL CITRATE (PF) 100 MCG/2ML IJ SOLN
25.0000 ug | INTRAMUSCULAR | Status: DC | PRN
  Administered 2019-09-05 (×2): 50 ug via INTRAVENOUS

## 2019-09-05 MED ORDER — HEPARIN SODIUM (PORCINE) 1000 UNIT/ML IJ SOLN
INTRAMUSCULAR | Status: AC
  Filled 2019-09-05: qty 5

## 2019-09-05 MED ORDER — FENTANYL CITRATE (PF) 250 MCG/5ML IJ SOLN
INTRAMUSCULAR | Status: AC
  Filled 2019-09-05: qty 5

## 2019-09-05 MED ORDER — PHENYLEPHRINE HCL-NACL 10-0.9 MG/250ML-% IV SOLN
INTRAVENOUS | Status: DC | PRN
Start: 2019-09-05 — End: 2019-09-05
  Administered 2019-09-05: 50 ug/min via INTRAVENOUS

## 2019-09-05 MED ORDER — TRACE MINERALS CU-MN-SE-ZN 300-55-60-3000 MCG/ML IV SOLN
INTRAVENOUS | Status: AC
  Filled 2019-09-05: qty 392

## 2019-09-05 MED ORDER — FENTANYL CITRATE (PF) 100 MCG/2ML IJ SOLN
INTRAMUSCULAR | Status: AC
  Filled 2019-09-05: qty 2

## 2019-09-05 MED ORDER — ONDANSETRON HCL 4 MG/2ML IJ SOLN: 4.0000 mg | Freq: Once | INTRAMUSCULAR | Status: DC | PRN

## 2019-09-05 MED ORDER — ALBUTEROL SULFATE HFA 108 (90 BASE) MCG/ACT IN AERS
INHALATION_SPRAY | RESPIRATORY_TRACT | Status: DC | PRN
  Administered 2019-09-05 (×2): 2 via RESPIRATORY_TRACT

## 2019-09-05 MED ORDER — FENTANYL CITRATE (PF) 250 MCG/5ML IJ SOLN
INTRAMUSCULAR | Status: DC | PRN
  Administered 2019-09-05: 25 ug via INTRAVENOUS
  Administered 2019-09-05: 50 ug via INTRAVENOUS
  Administered 2019-09-05: 25 ug via INTRAVENOUS
  Administered 2019-09-05: 50 ug via INTRAVENOUS

## 2019-09-05 MED ORDER — PHENYLEPHRINE HCL (PRESSORS) 10 MG/ML IV SOLN
INTRAVENOUS | Status: DC | PRN
  Administered 2019-09-05 (×2): 120 ug via INTRAVENOUS
  Administered 2019-09-05: 160 ug via INTRAVENOUS

## 2019-09-05 MED ORDER — ROCURONIUM BROMIDE 10 MG/ML (PF) SYRINGE
PREFILLED_SYRINGE | INTRAVENOUS | Status: AC
  Filled 2019-09-05: qty 10

## 2019-09-05 MED ORDER — LIDOCAINE HCL (CARDIAC) PF 100 MG/5ML IV SOSY
PREFILLED_SYRINGE | INTRAVENOUS | Status: DC | PRN
  Administered 2019-09-05: 80 mg via INTRATRACHEAL

## 2019-09-05 MED ORDER — CHLORHEXIDINE GLUCONATE 0.12 % MT SOLN
OROMUCOSAL | Status: AC
  Administered 2019-09-05: 15 mL via OROMUCOSAL
  Filled 2019-09-05: qty 15

## 2019-09-05 MED ORDER — MIDAZOLAM HCL 2 MG/2ML IJ SOLN
INTRAMUSCULAR | Status: DC | PRN
  Administered 2019-09-05: 1 mg via INTRAVENOUS

## 2019-09-05 MED ORDER — SODIUM CHLORIDE 0.9 % IV SOLN: INTRAVENOUS | Status: DC | PRN

## 2019-09-05 MED ORDER — ALBUMIN HUMAN 5 % IV SOLN
INTRAVENOUS | Status: DC | PRN
Start: 2019-09-05 — End: 2019-09-05

## 2019-09-05 MED ORDER — CHLORHEXIDINE GLUCONATE 0.12 % MT SOLN: 15.0000 mL | Freq: Once | OROMUCOSAL | Status: AC

## 2019-09-05 MED ORDER — HYDROMORPHONE HCL 1 MG/ML IJ SOLN
INTRAMUSCULAR | Status: AC
  Filled 2019-09-05: qty 1

## 2019-09-05 MED ORDER — VASOPRESSIN 20 UNIT/ML IV SOLN
INTRAVENOUS | Status: AC
  Filled 2019-09-05: qty 1

## 2019-09-05 MED ORDER — PROPOFOL 10 MG/ML IV BOLUS
INTRAVENOUS | Status: DC | PRN
  Administered 2019-09-05: 100 mg via INTRAVENOUS

## 2019-09-05 MED ORDER — 0.9 % SODIUM CHLORIDE (POUR BTL) OPTIME
TOPICAL | Status: DC | PRN
  Administered 2019-09-05: 2000 mL

## 2019-09-05 MED ORDER — LIDOCAINE 2% (20 MG/ML) 5 ML SYRINGE
INTRAMUSCULAR | Status: AC
  Filled 2019-09-05: qty 5

## 2019-09-05 MED ORDER — SUGAMMADEX SODIUM 500 MG/5ML IV SOLN
INTRAVENOUS | Status: DC | PRN
Start: 2019-09-05 — End: 2019-09-05
  Administered 2019-09-05: 160 mg via INTRAVENOUS

## 2019-09-05 MED ORDER — PROPOFOL 10 MG/ML IV BOLUS
INTRAVENOUS | Status: AC
  Filled 2019-09-05: qty 20

## 2019-09-05 MED ORDER — VASOPRESSIN 20 UNIT/ML IV SOLN
INTRAVENOUS | Status: DC | PRN
Start: 2019-09-05 — End: 2019-09-05
  Administered 2019-09-05: 2 [IU] via INTRAVENOUS

## 2019-09-05 MED ORDER — ROCURONIUM BROMIDE 100 MG/10ML IV SOLN
INTRAVENOUS | Status: DC | PRN
  Administered 2019-09-05: 50 mg via INTRAVENOUS

## 2019-09-05 MED ORDER — MIDAZOLAM HCL 2 MG/2ML IJ SOLN
INTRAMUSCULAR | Status: AC
  Filled 2019-09-05: qty 2

## 2019-09-05 MED ORDER — ONDANSETRON HCL 4 MG/2ML IJ SOLN
INTRAMUSCULAR | Status: DC | PRN
  Administered 2019-09-05: 4 mg via INTRAVENOUS

## 2019-09-05 SURGICAL SUPPLY — 43 items
APL PRP STRL LF DISP 70% ISPRP (MISCELLANEOUS) ×2
BLADE CLIPPER SURG (BLADE) IMPLANT
CANISTER SUCT 3000ML PPV (MISCELLANEOUS) ×4 IMPLANT
CHLORAPREP W/TINT 26 (MISCELLANEOUS) ×4 IMPLANT
COVER SURGICAL LIGHT HANDLE (MISCELLANEOUS) ×4 IMPLANT
COVER WAND RF STERILE (DRAPES) ×4 IMPLANT
DRAPE LAPAROSCOPIC ABDOMINAL (DRAPES) ×4 IMPLANT
DRAPE WARM FLUID 44X44 (DRAPES) ×4 IMPLANT
DRSG OPSITE POSTOP 4X10 (GAUZE/BANDAGES/DRESSINGS) IMPLANT
DRSG OPSITE POSTOP 4X8 (GAUZE/BANDAGES/DRESSINGS) IMPLANT
ELECT BLADE 6.5 EXT (BLADE) IMPLANT
ELECT CAUTERY BLADE 6.4 (BLADE) ×8 IMPLANT
ELECT REM PT RETURN 9FT ADLT (ELECTROSURGICAL) ×4
ELECTRODE REM PT RTRN 9FT ADLT (ELECTROSURGICAL) ×2 IMPLANT
GAUZE SPONGE 4X4 12PLY STRL (GAUZE/BANDAGES/DRESSINGS) ×2 IMPLANT
GLOVE BIO SURGEON STRL SZ7 (GLOVE) ×4 IMPLANT
GLOVE BIOGEL PI IND STRL 7.5 (GLOVE) ×2 IMPLANT
GLOVE BIOGEL PI INDICATOR 7.5 (GLOVE) ×2
GOWN STRL REUS W/ TWL LRG LVL3 (GOWN DISPOSABLE) ×4 IMPLANT
GOWN STRL REUS W/TWL LRG LVL3 (GOWN DISPOSABLE) ×8
HANDLE SUCTION POOLE (INSTRUMENTS) ×2 IMPLANT
KIT BASIN OR (CUSTOM PROCEDURE TRAY) ×4 IMPLANT
KIT TURNOVER KIT B (KITS) ×4 IMPLANT
LIGASURE IMPACT 36 18CM CVD LR (INSTRUMENTS) IMPLANT
NS IRRIG 1000ML POUR BTL (IV SOLUTION) ×8 IMPLANT
PACK GENERAL/GYN (CUSTOM PROCEDURE TRAY) ×4 IMPLANT
PAD ARMBOARD 7.5X6 YLW CONV (MISCELLANEOUS) ×4 IMPLANT
PENCIL SMOKE EVACUATOR (MISCELLANEOUS) ×4 IMPLANT
SPECIMEN JAR LARGE (MISCELLANEOUS) IMPLANT
SPONGE LAP 18X18 RF (DISPOSABLE) IMPLANT
STAPLER VISISTAT 35W (STAPLE) ×4 IMPLANT
SUCTION POOLE HANDLE (INSTRUMENTS) ×4
SUT ETHILON 1 LR 30 (SUTURE) ×6 IMPLANT
SUT NOVA NAB DX-16 0-1 5-0 T12 (SUTURE) ×4 IMPLANT
SUT PDS AB 1 TP1 96 (SUTURE) ×8 IMPLANT
SUT SILK 2 0 SH CR/8 (SUTURE) ×4 IMPLANT
SUT SILK 2 0 TIES 10X30 (SUTURE) ×4 IMPLANT
SUT SILK 3 0 SH CR/8 (SUTURE) ×4 IMPLANT
SUT SILK 3 0 TIES 10X30 (SUTURE) ×4 IMPLANT
SUT VIC AB 3-0 SH 18 (SUTURE) IMPLANT
TOWEL GREEN STERILE (TOWEL DISPOSABLE) ×4 IMPLANT
TRAY FOLEY MTR SLVR 16FR STAT (SET/KITS/TRAYS/PACK) IMPLANT
YANKAUER SUCT BULB TIP NO VENT (SUCTIONS) IMPLANT

## 2019-09-05 NOTE — Op Note (Signed)
Preop diagnosis: Partial fascial dehiscence status post exploratory laparotomy Postop diagnosis: Same Procedure performed: Exploratory laparotomy, closure abdominal wall, placement of retention sutures Surgeon: Maia Petties Assistant:  Margie Billet, PA-C Anesthesia: General Indications: This is a 64 year old male with multiple medical issues who is on dialysis who is 1 week status post exploratory laparotomy with lysis of adhesions for small bowel obstruction.  Over the last few days, he has had increasing amounts of clear drainage coming from his midline incision.  He was felt to have a partial fascial dehiscence.  The drainage is increasing so he presents now for washout and closure of his abdomen with retention sutures to prevent evisceration.  Description of procedure: The patient was brought down to the operating room for hemodialysis.  He is brought to the operating room and placed in the supine position on the operating table.  After an adequate level general anesthesia was obtained, his abdomen was prepped with Betadine and draped in sterile fashion.  A timeout was taken to ensure the proper patient and proper procedure.  I removed the staples.  We immediately encountered some visible bowel and omentum.  There is a 4 cm area of fascial dehiscence.  No purulent fluid or succus entericus is noted.  I removed the remainder of the fascial suture.  We briefly explored the abdomen.  All the visible small bowel appears viable.  There is no sign of leak or fistula.  No sign of purulent fluid.  The omentum is intact and is adherent down to the anterior surface of the bowel.  We irrigated thoroughly and inspected for hemostasis.  We then closed the fascia with multiple interrupted figure-of-eight #1 Novafil sutures.  Also used 3 retention sutures of #1 Ethilon that went through the fascia, subcutaneous tissue, and skin.  The Novafil sutures were tied down.  We irrigated the subcutaneous tissues and closed  the skin with staples.  We then tied the retention sutures over rubber bolsters.  A dry dressing was applied.  The patient was then extubated to recovery room in stable condition.  All sponge, instrument, and needle counts are correct.  Imogene Burn. Georgette Dover, MD, [facility]  General/ Trauma Surgery   09/05/2019 12:09 PM

## 2019-09-05 NOTE — Anesthesia Postprocedure Evaluation (Signed)
Anesthesia Post Note  Patient: Aaron [name]  Procedure(s) Performed: EXPLORATORY LAPAROTOMY (N/A Abdomen) Irrigation And Debridement Abdomen, Placement of retention sutures (Abdomen)     Patient location during evaluation: PACU Anesthesia Type: General Level of consciousness: awake and alert Pain management: pain level controlled Vital Signs Assessment: post-procedure vital signs reviewed and stable Respiratory status: spontaneous breathing, nonlabored ventilation, respiratory function stable and patient connected to nasal cannula oxygen Cardiovascular status: blood pressure returned to baseline and stable Postop Assessment: no apparent nausea or vomiting Anesthetic complications: no    Last Vitals:  Vitals:   09/05/19 1257 09/05/19 1306  BP: 125/88 130/68  Pulse: (!) 110 (!) 120  Resp: 16 18  Temp:  36.7 C  SpO2: 97% 90%    Last Pain:  Vitals:   09/05/19 1306  TempSrc: Oral  PainSc:                  Catalina Gravel

## 2019-09-05 NOTE — Progress Notes (Signed)
7 Days Post-Op   Subjective/Chief Complaint: Patient with increasing amounts of drainage through his incision Wider gaps visible between staples WBC increased to 22.2 No BM - has been NPO  Objective: Vital signs in last 24 hours: Temp:  [97.8 F (36.6 C)-98.4 F (36.9 C)] 98.4 F (36.9 C) (05/24 0514) Pulse Rate:  [65-103] 83 (05/24 0514) Resp:  [17-20] 17 (05/24 0514) BP: (93-128)/(77-88) 107/88 (05/24 0514) SpO2:  [95 %-98 %] 95 % (05/24 0514) FiO2 (%):  [21 %] 21 % (05/23 0813) Weight:  [79.7 kg] 79.7 kg (05/24 0200) Last BM Date: 09/02/19  Intake/Output from previous day: 05/23 0701 - 05/24 0700 In: 306.1 [I.V.:305.7; IV Piggyback:0.4] Out: 300 [Urine:300] Intake/Output this shift: No intake/output data recorded.  Chronically ill-appearing male in NAD - seen in HD Abd - soft, protuberant; incisional tenderness Wide gaps between staples with continuous serosanguinous drainage soaking through dressing  Lab Results:  Recent Labs    09/04/19 0518 09/05/19 0511  WBC 15.7* 22.2*  HGB 11.7* 11.1*  HCT 36.0* 34.0*  PLT 204 219   BMET Recent Labs    09/03/19 0529 09/05/19 0511  NA 136 129*  K 4.6 5.1  CL 99 93*  CO2 24 20*  GLUCOSE 80 95  BUN 68* 93*  CREATININE 5.45* 7.10*  CALCIUM 8.3* 7.9*   PT/INR No results for input(s): LABPROT, INR in the last 72 hours. ABG No results for input(s): PHART, HCO3 in the last 72 hours.  Invalid input(s): PCO2, PO2  Studies/Results: CT ABDOMEN PELVIS W CONTRAST  Addendum Date: 09/03/2019   ADDENDUM REPORT: 09/03/2019 18:02 ADDENDUM: It should be noted that upon further evaluation, patient has a surgical incision site along the midline of the abdomen. A mild amount of subcutaneous inflammatory fat stranding is seen without evidence of an organized fluid collection or abscess. A few small foci of air density is also seen within the subcutaneous fat. A mild amount of intra-abdominal free air is seen consistent with the  patient's recent abdominal surgical intervention. Results were discussed with Dr. Tyrell Antonio at 5:59 p.m. [place] on Sep 03, 2019. Electronically Signed   By: Virgina Norfolk M.D.   On: 09/03/2019 18:02   Result Date: 09/03/2019 CLINICAL DATA:  Evaluate for small bowel obstruction. EXAM: CT ABDOMEN AND PELVIS WITH CONTRAST TECHNIQUE: Multidetector CT imaging of the abdomen and pelvis was performed using the standard protocol following bolus administration of intravenous contrast. CONTRAST:  184mL OMNIPAQUE IOHEXOL 300 MG/ML  SOLN COMPARISON:  Aug 25, 2019 FINDINGS: Lower chest: Mild atelectasis and/or early infiltrate is seen within the posterior aspect of the right lung base. There is a small anterior pericardial effusion. Hepatobiliary: Innumerable subcentimeter cysts are seen scattered throughout the liver parenchyma. No gallstones, gallbladder wall thickening, or biliary dilatation. Pancreas: Unremarkable. No pancreatic ductal dilatation or surrounding inflammatory changes. Spleen: Normal in size without focal abnormality. Adrenals/Urinary Tract: Adrenal glands are unremarkable. Innumerable cysts of various sizes are seen within both kidneys. A 5.7 cm x 4.6 cm well-defined heterogeneous mildly increased attenuation lesion is seen within the anteromedial aspect of the mid left kidney. A mild amount of air is seen within the lumen of an otherwise normal appearing urinary bladder. Stomach/Bowel: Stomach is within normal limits. The appendix is not clearly identified. Numerous dilated small bowel loops are seen throughout the mid and lower left abdomen (maximum small bowel diameter of approximately 4.4 cm). A transition zone is seen within the left lower quadrant. Noninflamed diverticula are seen throughout the large bowel.  Vascular/Lymphatic: There is marked severity aortic calcification. No enlarged abdominal or pelvic lymph nodes. Reproductive: The prostate gland is mildly enlarged. Other: The distal portion of  a ventriculoperitoneal shunt is seen with its distal tip noted within the pelvis. Musculoskeletal: Marked severity multilevel degenerative changes seen throughout the lumbar spine. Additional findings suggestive of prior discitis/osteomyelitis is seen at the level of L1-L2. IMPRESSION: 1. Findings consistent with a partial small bowel obstruction with a transition zone seen within the left lower quadrant. 2. Innumerable hepatic and renal cysts consistent with polycystic kidney disease. 3. Colonic diverticulosis. 4. Mild atelectasis and/or early infiltrate within the posterior aspect of the right lung base. Aortic Atherosclerosis (ICD10-I70.0). Electronically Signed: By: Virgina Norfolk M.D. On: 09/03/2019 17:55   DG CHEST PORT 1 VIEW  Result Date: 09/04/2019 CLINICAL DATA:  Cough. EXAM: PORTABLE CHEST 1 VIEW COMPARISON:  Aug 29, 2019 FINDINGS: Interval placement of large-bore catheter which terminates at the cavoatrial junction. Cardiomediastinal silhouette is normal. Mediastinal contours appear intact. There is no evidence of focal airspace consolidation, pleural effusion or pneumothorax. Mild bibasilar atelectasis. Osseous structures are without acute abnormality. Soft tissues are grossly normal. IMPRESSION: 1. Mild bibasilar atelectasis versus peribronchial airspace consolidation. 2. Interval placement of large-bore central venous catheter which terminates at the cavoatrial junction. Electronically Signed   By: Fidela Salisbury M.D.   On: 09/04/2019 13:15    Anti-infectives: Anti-infectives (From admission, onward)   Start     Dose/Rate Route Frequency Ordered Stop   09/05/19 1800  ceFAZolin (ANCEF) IVPB 1 g/50 mL premix     1 g 100 mL/hr over 30 Minutes Intravenous Every 24 hours 09/04/19 0939     09/04/19 1030  ceFAZolin (ANCEF) IVPB 1 g/50 mL premix     1 g 100 mL/hr over 30 Minutes Intravenous  Once 09/04/19 0939 09/04/19 1629   08/31/19 1415  ceFAZolin (ANCEF) IVPB 2g/100 mL premix      2 g 200 mL/hr over 30 Minutes Intravenous  Once 08/31/19 1328 08/31/19 1358   08/31/19 1400  ceFAZolin (ANCEF) powder 2 g  Status:  Discontinued     2 g Other To Surgery 08/31/19 1314 08/31/19 1326   08/29/19 1145  ceFAZolin (ANCEF) IVPB 2g/100 mL premix     2 g 200 mL/hr over 30 Minutes Intravenous To ShortStay Surgical 08/29/19 1127 08/29/19 1445   08/23/19 2200  azithromycin (ZITHROMAX) tablet 500 mg  Status:  Discontinued    Note to Pharmacy: Pharmacy to adjust timing for next dose.   500 mg Oral Daily at bedtime 08/23/19 0233 08/25/19 2046   08/22/19 2130  azithromycin (ZITHROMAX) 500 mg in sodium chloride 0.9 % 250 mL IVPB     500 mg 250 mL/hr over 60 Minutes Intravenous  Once 08/22/19 2122 08/22/19 2245      Assessment/Plan: CHF COPD A fib  Polycystic kidney disease, ESRD, on PD -  converted to HD temporarily H/O MI - plavix on hold as of 5/13 CAD Hep C HLD  SBO Exploratory laparotomy, LOA, repair of SB serosal injury x3, resection of mesenteric nodule 08/29/19 Dr. Bobbye Morton - Post-op ileus -fascial dehiscence; increasing drainage  - IS 10x q 1h   FEN - NPO, sips with meds VTE - SCDs, Eliquis held 5/22 continue to hold ID -Ancef for cellulitis   Due to increasing drainage and risk for evisceration, recommend return to OR for exploratory laparotomy, washout, and placement of retention sutures.  His COPD with chronic cough, poor nutrition, and overall poor medical condition  make this procedure higher risk for complications, but he is at fairly high risk for evisceration.  Placement of retention sutures will hopefully allow [place] to restart his Eliquis and advance his progression towards discharge.  LOS: 14 days    Maia Petties 09/05/2019

## 2019-09-05 NOTE — Transfer of Care (Signed)
Immediate Anesthesia Transfer of Care Note  Patient: Aaron [name]  Procedure(s) Performed: EXPLORATORY LAPAROTOMY (N/A Abdomen) Irrigation And Debridement Abdomen, Placement of retention sutures (Abdomen)  Patient Location: PACU  Anesthesia Type:General  Level of Consciousness: awake, alert  and oriented  Airway & Oxygen Therapy: Patient Spontanous Breathing and Patient connected to face mask oxygen  Post-op Assessment: Report given to RN, Post -op Vital signs reviewed and stable and Patient moving all extremities  Post vital signs: Reviewed and stable  Last Vitals:  Vitals Value Taken Time  BP 117/76 09/05/19 1211  Temp 36.4 C 09/05/19 1210  Pulse 102 09/05/19 1216  Resp 37 09/05/19 1217  SpO2 100 % 09/05/19 1216  Vitals shown include unvalidated device data.  Last Pain:  Vitals:   09/05/19 1210  TempSrc:   PainSc: Asleep      Patients Stated Pain Goal: 2 (65/53/74 [id number])  Complications: No apparent anesthesia complications

## 2019-09-05 NOTE — Progress Notes (Signed)
[facility] Progress Note     Assessment/ Plan:   1. SOB/COPD/pulmonary edema-Felt to be multifactorial - COPD/ Afib with rapid rate and volume overload. Converted to HD for ultrafiltration.  S/p Steroids/inhalers/azithro.  Improved.  2. ESRD-secondary to polycystic kidney disease. Has been on PD for 6 mos. followed in [place]. Albumin is low which can be problematic in PD and UF.   -Appreciate the [facility] TC 5/19 by VIR.  HD today.  Has spot at [facility] 2nd shift (MWF regimen) 1145AM.  Lt sidedPICC line in place (5/17) for TPN initially but now leaving in for medication or blood draws for now.  3. Anemia- not an issue right now 4. Secondary hyperparathyroidism - calc OK- Phos4.6. We will continue to monitor; on phoslo 5. HTN/volume-continue carvedilol and diltiazem for atrial fibrillation.  6. Leukocytosis:Afebrile but trending up to 22k today.  Back to OR for washout.Fluid studies from peritoneal fluid negative for infection. 7. Partial small bowel obstruction: Better now, tolerating PO. Surgery following and appreciate assistance; dense adhesions on 5/17. Back to OR today.  8. Hyperdense kidney cyst: Consider further evaluation with MRI in the future.  Subjective:   Going back to OR after HD today due to increased drainage and risk for evisceration.   He has no complaints.    Objective:   BP (!) 96/58 Comment: bp recheck. improved. will keep uf off for now  Pulse 60   Temp 97.9 F (36.6 C) (Oral)   Resp 20   Ht 6\' 4"  (1.93 m)   Wt 78.2 kg   SpO2 95%   BMI 20.99 kg/m   Intake/Output Summary (Last 24 hours) at 09/05/2019 0935 Last data filed at 09/05/2019 0300 Gross per 24 hour  Intake 306.1 ml  Output 300 ml  Net 6.1 ml   Weight change: 0.6 kg  Physical Exam: General:supine inbed on HD Heart:RRR Lungs:Coarse bilateral breath sounds, no increased work of breathing Abdomen:Distented with abdominal binder in  place Extremities: Trace edema Dialysis Access: PD cath,[facility] TC 5/19 Access: Lt arm PICC  Imaging: CT ABDOMEN PELVIS W CONTRAST  Addendum Date: 09/03/2019   ADDENDUM REPORT: 09/03/2019 18:02 ADDENDUM: It should be noted that upon further evaluation, patient has a surgical incision site along the midline of the abdomen. A mild amount of subcutaneous inflammatory fat stranding is seen without evidence of an organized fluid collection or abscess. A few small foci of air density is also seen within the subcutaneous fat. A mild amount of intra-abdominal free air is seen consistent with the patient's recent abdominal surgical intervention. Results were discussed with Dr. Tyrell Antonio at 5:59 p.m. [place] on Sep 03, 2019. Electronically Signed   By: Virgina Norfolk M.D.   On: 09/03/2019 18:02   Result Date: 09/03/2019 CLINICAL DATA:  Evaluate for small bowel obstruction. EXAM: CT ABDOMEN AND PELVIS WITH CONTRAST TECHNIQUE: Multidetector CT imaging of the abdomen and pelvis was performed using the standard protocol following bolus administration of intravenous contrast. CONTRAST:  152mL OMNIPAQUE IOHEXOL 300 MG/ML  SOLN COMPARISON:  Aug 25, 2019 FINDINGS: Lower chest: Mild atelectasis and/or early infiltrate is seen within the posterior aspect of the right lung base. There is a small anterior pericardial effusion. Hepatobiliary: Innumerable subcentimeter cysts are seen scattered throughout the liver parenchyma. No gallstones, gallbladder wall thickening, or biliary dilatation. Pancreas: Unremarkable. No pancreatic ductal dilatation or surrounding inflammatory changes. Spleen: Normal in size without focal abnormality. Adrenals/Urinary Tract: Adrenal glands are unremarkable. Innumerable cysts of various sizes are seen within both  kidneys. A 5.7 cm x 4.6 cm well-defined heterogeneous mildly increased attenuation lesion is seen within the anteromedial aspect of the mid left kidney. A mild amount of air is seen within  the lumen of an otherwise normal appearing urinary bladder. Stomach/Bowel: Stomach is within normal limits. The appendix is not clearly identified. Numerous dilated small bowel loops are seen throughout the mid and lower left abdomen (maximum small bowel diameter of approximately 4.4 cm). A transition zone is seen within the left lower quadrant. Noninflamed diverticula are seen throughout the large bowel. Vascular/Lymphatic: There is marked severity aortic calcification. No enlarged abdominal or pelvic lymph nodes. Reproductive: The prostate gland is mildly enlarged. Other: The distal portion of a ventriculoperitoneal shunt is seen with its distal tip noted within the pelvis. Musculoskeletal: Marked severity multilevel degenerative changes seen throughout the lumbar spine. Additional findings suggestive of prior discitis/osteomyelitis is seen at the level of L1-L2. IMPRESSION: 1. Findings consistent with a partial small bowel obstruction with a transition zone seen within the left lower quadrant. 2. Innumerable hepatic and renal cysts consistent with polycystic kidney disease. 3. Colonic diverticulosis. 4. Mild atelectasis and/or early infiltrate within the posterior aspect of the right lung base. Aortic Atherosclerosis (ICD10-I70.0). Electronically Signed: By: Virgina Norfolk M.D. On: 09/03/2019 17:55   DG CHEST PORT 1 VIEW  Result Date: 09/04/2019 CLINICAL DATA:  Cough. EXAM: PORTABLE CHEST 1 VIEW COMPARISON:  Aug 29, 2019 FINDINGS: Interval placement of large-bore catheter which terminates at the cavoatrial junction. Cardiomediastinal silhouette is normal. Mediastinal contours appear intact. There is no evidence of focal airspace consolidation, pleural effusion or pneumothorax. Mild bibasilar atelectasis. Osseous structures are without acute abnormality. Soft tissues are grossly normal. IMPRESSION: 1. Mild bibasilar atelectasis versus peribronchial airspace consolidation. 2. Interval placement of large-bore  central venous catheter which terminates at the cavoatrial junction. Electronically Signed   By: Fidela Salisbury M.D.   On: 09/04/2019 13:15    Labs: BMET Recent Labs  Lab 08/29/19 1103 08/30/19 1040 08/30/19 1138 08/31/19 0500 09/01/19 0625 09/03/19 0529 09/05/19 0511  NA  --  138 138 137 135 136 129*  K  --  3.8 3.7 3.6 3.6 4.6 5.1  CL  --  98 99 98 99 99 93*  CO2  --  26 26 26 25 24  20*  GLUCOSE  --  114* 123* 92 84 80 95  BUN  --  55* 55* 40* 65* 68* 93*  CREATININE  --  6.84* 6.77* 5.19* 5.98* 5.45* 7.10*  CALCIUM  --  7.7* 7.5* 7.6* 7.6* 8.3* 7.9*  PHOS 5.8* 6.5* 6.3* 3.7 3.3  --  4.6   CBC Recent Labs  Lab 09/02/19 0635 09/03/19 0529 09/04/19 0518 09/05/19 0511  WBC 11.6* 12.3* 15.7* 22.2*  NEUTROABS  --   --   --  20.1*  HGB 11.6* 12.1* 11.7* 11.1*  HCT 37.3* 37.4* 36.0* 34.0*  MCV 111.7* 99.5 99.7 100.3*  PLT 188 211 204 219    Medications:    . atorvastatin  80 mg Oral Daily  . budesonide (PULMICORT) nebulizer solution  0.25 mg Nebulization BID  . calcium acetate  1,334 mg Oral TID WC  . Chlorhexidine Gluconate Cloth  6 each Topical Daily  . feeding supplement (NEPRO CARB STEADY)  237 mL Oral BID BM  . gabapentin  300 mg Oral QHS  . gentamicin cream  1 application Topical Daily  . insulin aspart  0-6 Units Subcutaneous Q6H  . ipratropium-albuterol  3 mL Nebulization BID  .  metoprolol tartrate  5 mg Intravenous Q6H  . multivitamin  1 tablet Oral QHS  . pantoprazole (PROTONIX) IV  40 mg Intravenous Q12H  . sodium chloride flush  10-40 mL Intracatheter Q12H     Jannifer Hick MD [facility] Pager [phone]

## 2019-09-05 NOTE — Progress Notes (Signed)
PHARMACY - TOTAL PARENTERAL NUTRITION CONSULT NOTE   Indication: Recurrent Small bowel obstruction, post-op ileus  Patient Measurements: Height: 6\' 4"  (193 cm) Weight: 79.7 kg (175 lb 11.3 oz) IBW/kg (Calculated) : 86.8 TPN AdjBW (KG): 81.5 Body mass index is 21.39 kg/m. Usual Weight: 180 lb  Assessment:  64 yo M admitted for COPD exacerbation and afib who developed SBO on 08/25/19; however, this resolved and the patient had a BM on 5/21. The patient tolerated po intake well 5/20-5/21, and TPN was stopped on 5/21. The patient has had increased belching, minimal flatus, and concern for recurrent pSBO based on 5/22 Abd CT per surgery. Pharmacy has been consulted to resume TPN.  Glucose / Insulin: A1c 5.9, CBGs controlled. Hypoglycemic episodes 5/23. No meds currently Electrolytes: Pre-HD labs 5/24 - Na 129, K 5.1, Cl 93 / CO2 20, Mag 1.7 (goal >/=2 with ileus/afib); others WNL Renal: ESRD on PD, switch to HD per Gen Surg due to suboptimal dialysis on PD and abdominal surgery. Currently in HD 5/24 (refused 5/22) LFTs / TGs: LFTs / Tbili / TG WNL Prealbumin / albumin: Prealbumin 12.5, Albumin 1.8  Intake / Output; MIVF: LBM 5/21, minimal flatus. UOP 0.2 ml/kg/hr. D5 at 35 ml/hr GI Imaging: Per surgery: suggestion of pSBO on CT A/P 5/22 Surgeries / Procedures:  5/17 ex lap - lysis of adhesions, small bowel serosal repair x3, resection of mesenteric nodule 5/24 Surgery recommending return to OR for ex-lap, washout, placement of retention sutures due to increased drainage and risk of evisceration  Central access: PICC line 08/29/19 TPN start date: 5/17 - 5/20; 5/24 >>  Nutritional Goals (RD recommendation 5/21): Kcal: 0034-9179; Protein: 125-140 grams; Fluid: minimize for ESRD  Goal TPN rate (concentrated) is 75 mL/hr (provides 126g of protein, 360g dextrose, 72g lipids and 2448 kcals per day)  Current Nutrition:  NPO, sips with meds D5W @ 35 ml/hr  Plan:  Start concentrated TPN at 35  mL/hr at 1800 -TPN will provide 59g protein, 168g CHO, and 34g SMOF lipids, for total 1142 kCal, meeting 47% of patient needs. Electrolytes in TPN: standard 55mEq/L of Na, reduced Mag 3 meq/L, remove K/Ca/Phos for now in ESRD on HD. Cl:Ac ratio 1:2 Continue PO Renal MVI + add trace elements to TPN. Remove chromium in ESRD requiring RRT Initiate very sensitive q6h SSI and adjust as needed  D/c D5W at 35 ml/hr when TPN bag hung at 1800 Monitor TPN labs - f/u electrolytes post-HD F/u Surgery plans, ability to tolerate PO and wean TPN    Arturo Morton, PharmD, BCPS Please check AMION for all [facility] contact numbers Clinical Pharmacist 09/05/2019 7:42 AM

## 2019-09-05 NOTE — Progress Notes (Signed)
PROGRESS NOTE    Aaron [name]  BZJ:[id number] DOB: 06-16-1955 DOA: 08/22/2019 PCP: Gwenlyn Saran [facility]   Brief Narrative: 64 year old with past medical history significant for hypertension, hyperlipidemia, chronic A. fib on Eliquis, ESRD on peritoneal dialysis presented to emergency department for evaluation of shortness of breath.  Patient does have a history of COPD, he recently quit smoking few months ago.  He does have some wheezing at baseline.  Came to the ER with worsening shortness of breath. In the emergency room, he was comfortable, found to have A. fib with RVR started on Cardizem drip.  [facility] to [name] bed from [facility].  On 5/13 patient is started having intermittent biliary vomiting.  CT scan showed a small bowel obstruction.  Surgery consulted in the morning of 514.  On 517 no improvement in the small bowel obstruction.  Patient went for exploratory laparotomy by surgery.  On 518 PICC line for TPN.  Right IJ line temporary catheter for hemodialysis.  Right IJ for hemodialysis not working, and to proceed with IR for tunneled catheter. Patient was notice to have drainage from abdominal wall incision. Plan is for OR today for exploratory laparotomy, washout and placement of retention Sutures.   Assessment & Plan:   Principal Problem:   COPD with acute exacerbation ([place]) Active Problems:   Essential hypertension   ESRD on dialysis ([place])   Exertional dyspnea   Acute exacerbation of CHF (congestive heart failure) (HCC)   Atrial fibrillation with RVR (HCC)   Malnutrition of moderate degree   Atrial fibrillation with rapid ventricular response (HCC)   ESRD on peritoneal dialysis ([place])   SBO (small bowel obstruction) ([place])   DNR (do not resuscitate)  1-COPD with acute exacerbation: Improved Continue with bronchodilators as needed.  Off of oxygen.  2-Small bowel obstruction secondary to adhesion: No improvement with conservative  management.  Status post exploratory laparotomy lysis of adhesions on 5/17.  Holding  Eliquis and Plavix since 5/13 Treated with  TPN Had small watery stool on 09/01/2019 Leaking serous sanguineous fluid from incision. CT negative for large fascial defect. Showed sign of SBO.  CT concern for pSBO 5-22. NPO sisp with meds.  TPN ordered.  Plan for OR today for exploratory laparotomy, washout and placement of retention sutures.   3-Chronic A. fib with RVR: On IV metoprolol Hold eliquis due to leaking from wound. Hold plavix  ESRD  on peritoneal dialysis: Follow by Nephrology.  He will need to convert to hemodialysis through DC for 1 or 2 months after surgery 5/17  Cough; Leukocytosis. Started  ancef.  ? Cellulitis wound,  Chest  Ray: Mild bibasilar atelectasis versus peribronchial airspace consolidation.   Hypoglycemia: He was a started on IV fluids D5, plan to transition to TPN  Nutrition Problem: Moderate Malnutrition Etiology: chronic illness(COPD, ESRD on PD)    Signs/Symptoms: energy intake < 75% for > or equal to 1 month, mild fat depletion, moderate fat depletion, mild muscle depletion, moderate muscle depletion    Interventions: TPN, Boost Breeze  Estimated body mass index is 20.99 kg/m as calculated from the following:   Height as of this encounter: 6\' 4"  (1.93 m).   Weight as of this encounter: 78.2 kg.   DVT prophylaxis: Holding Eliquis for surgery Code Status: Full code Family Communication: care discussed with patient Disposition Plan:  Status is: Inpatient  Leaking fluid from wound, need further observation, ? SBO on CT   Dispo: The patient is from: Home  Anticipated d/c is to: Home               Anticipated d/c date is: 2-3 days.               Patient currently ; not medical stable         Consultants:   General surgery   IR  Procedures:   none  Antimicrobials:    Subjective: He is still leaking serosanguineous fluid  from wound.  Plan to go to the OR today for exploratory laparotomy  Objective: Vitals:   09/05/19 1230 09/05/19 1242 09/05/19 1257 09/05/19 1306  BP: (!) 164/84 121/77 125/88 130/68  Pulse: (!) 105 (!) 108 (!) 110 (!) 120  Resp: 16 18 16 18   Temp:    98 F (36.7 C)  TempSrc:    Oral  SpO2: 96% 97% 97% 90%  Weight:      Height:        Intake/Output Summary (Last 24 hours) at 09/05/2019 1514 Last data filed at 09/05/2019 1152 Gross per 24 hour  Intake 756.1 ml  Output 300 ml  Net 456.1 ml   Filed Weights   09/04/19 0026 09/05/19 0200 09/05/19 0648  Weight: 79.1 kg 79.7 kg 78.2 kg    Examination:  General exam: NAD Respiratory system:  CTA Cardiovascular system:  S 1, S 2 RRR Gastrointestinal system: Bowel sounds decreased, distended, staples in place, leaking serosanguineous fluid.  Abdominal binder Central nervous system: Alert, following commands Extremities: No edema Skin: No rashes.    Data Reviewed: I have personally reviewed following labs and imaging studies  CBC: Recent Labs  Lab 09/01/19 0625 09/02/19 0635 09/03/19 0529 09/04/19 0518 09/05/19 0511  WBC 10.5 11.6* 12.3* 15.7* 22.2*  NEUTROABS  --   --   --   --  20.1*  HGB 11.8* 11.6* 12.1* 11.7* 11.1*  HCT 37.4* 37.3* 37.4* 36.0* 34.0*  MCV 98.9 111.7* 99.5 99.7 100.3*  PLT 203 188 211 204 440   Basic Metabolic Panel: Recent Labs  Lab 08/30/19 1040 08/30/19 1040 08/30/19 1138 08/31/19 0500 09/01/19 0625 09/03/19 0529 09/05/19 0511  NA 138   < > 138 137 135 136 129*  K 3.8   < > 3.7 3.6 3.6 4.6 5.1  CL 98   < > 99 98 99 99 93*  CO2 26   < > 26 26 25 24  20*  GLUCOSE 114*   < > 123* 92 84 80 95  BUN 55*   < > 55* 40* 65* 68* 93*  CREATININE 6.84*   < > 6.77* 5.19* 5.98* 5.45* 7.10*  CALCIUM 7.7*   < > 7.5* 7.6* 7.6* 8.3* 7.9*  MG 1.9  --   --  1.8 1.8  --  1.7  PHOS 6.5*  --  6.3* 3.7 3.3  --  4.6   < > = values in this interval not displayed.   GFR: Estimated Creatinine Clearance: 11.8  mL/min (A) (by C-G formula based on SCr of 7.1 mg/dL (H)). Liver Function Tests: Recent Labs  Lab 08/30/19 1040 08/30/19 1138 08/31/19 0500 09/01/19 0625 09/05/19 0511  AST 12*  --  16 13* 14*  ALT <5  --  6 <5 6  ALKPHOS 65  --  71 59 78  BILITOT 0.7  --  0.8 0.4 0.7  PROT 5.5*  --  5.7* 5.1* 5.3*  ALBUMIN 2.2* 2.0* 2.1* 1.9* 1.8*   No results for input(s): LIPASE, AMYLASE in the last 168  hours. No results for input(s): AMMONIA in the last 168 hours. Coagulation Profile: No results for input(s): INR, PROTIME in the last 168 hours. Cardiac Enzymes: No results for input(s): CKTOTAL, CKMB, CKMBINDEX, TROPONINI in the last 168 hours. BNP (last 3 results) No results for input(s): PROBNP in the last 8760 hours. HbA1C: No results for input(s): HGBA1C in the last 72 hours. CBG: Recent Labs  Lab 09/04/19 2008 09/04/19 2122 09/05/19 0019 09/05/19 0357 09/05/19 1055  GLUCAP 67* 80 89 99 87   Lipid Profile: Recent Labs    09/05/19 0511  TRIG 96   Thyroid Function Tests: No results for input(s): TSH, T4TOTAL, FREET4, T3FREE, THYROIDAB in the last 72 hours. Anemia Panel: No results for input(s): VITAMINB12, FOLATE, FERRITIN, TIBC, IRON, RETICCTPCT in the last 72 hours. Sepsis Labs: No results for input(s): PROCALCITON, LATICACIDVEN in the last 168 hours.  No results found for this or any previous visit (from the past 240 hour(s)).       Radiology Studies: CT ABDOMEN PELVIS W CONTRAST  Addendum Date: 09/03/2019   ADDENDUM REPORT: 09/03/2019 18:02 ADDENDUM: It should be noted that upon further evaluation, patient has a surgical incision site along the midline of the abdomen. A mild amount of subcutaneous inflammatory fat stranding is seen without evidence of an organized fluid collection or abscess. A few small foci of air density is also seen within the subcutaneous fat. A mild amount of intra-abdominal free air is seen consistent with the patient's recent abdominal  surgical intervention. Results were discussed with Dr. Tyrell Antonio at 5:59 p.m. [place] on Sep 03, 2019. Electronically Signed   By: Virgina Norfolk M.D.   On: 09/03/2019 18:02   Result Date: 09/03/2019 CLINICAL DATA:  Evaluate for small bowel obstruction. EXAM: CT ABDOMEN AND PELVIS WITH CONTRAST TECHNIQUE: Multidetector CT imaging of the abdomen and pelvis was performed using the standard protocol following bolus administration of intravenous contrast. CONTRAST:  184mL OMNIPAQUE IOHEXOL 300 MG/ML  SOLN COMPARISON:  Aug 25, 2019 FINDINGS: Lower chest: Mild atelectasis and/or early infiltrate is seen within the posterior aspect of the right lung base. There is a small anterior pericardial effusion. Hepatobiliary: Innumerable subcentimeter cysts are seen scattered throughout the liver parenchyma. No gallstones, gallbladder wall thickening, or biliary dilatation. Pancreas: Unremarkable. No pancreatic ductal dilatation or surrounding inflammatory changes. Spleen: Normal in size without focal abnormality. Adrenals/Urinary Tract: Adrenal glands are unremarkable. Innumerable cysts of various sizes are seen within both kidneys. A 5.7 cm x 4.6 cm well-defined heterogeneous mildly increased attenuation lesion is seen within the anteromedial aspect of the mid left kidney. A mild amount of air is seen within the lumen of an otherwise normal appearing urinary bladder. Stomach/Bowel: Stomach is within normal limits. The appendix is not clearly identified. Numerous dilated small bowel loops are seen throughout the mid and lower left abdomen (maximum small bowel diameter of approximately 4.4 cm). A transition zone is seen within the left lower quadrant. Noninflamed diverticula are seen throughout the large bowel. Vascular/Lymphatic: There is marked severity aortic calcification. No enlarged abdominal or pelvic lymph nodes. Reproductive: The prostate gland is mildly enlarged. Other: The distal portion of a ventriculoperitoneal  shunt is seen with its distal tip noted within the pelvis. Musculoskeletal: Marked severity multilevel degenerative changes seen throughout the lumbar spine. Additional findings suggestive of prior discitis/osteomyelitis is seen at the level of L1-L2. IMPRESSION: 1. Findings consistent with a partial small bowel obstruction with a transition zone seen within the left lower quadrant. 2. Innumerable hepatic and  renal cysts consistent with polycystic kidney disease. 3. Colonic diverticulosis. 4. Mild atelectasis and/or early infiltrate within the posterior aspect of the right lung base. Aortic Atherosclerosis (ICD10-I70.0). Electronically Signed: By: Virgina Norfolk M.D. On: 09/03/2019 17:55   DG CHEST PORT 1 VIEW  Result Date: 09/04/2019 CLINICAL DATA:  Cough. EXAM: PORTABLE CHEST 1 VIEW COMPARISON:  Aug 29, 2019 FINDINGS: Interval placement of large-bore catheter which terminates at the cavoatrial junction. Cardiomediastinal silhouette is normal. Mediastinal contours appear intact. There is no evidence of focal airspace consolidation, pleural effusion or pneumothorax. Mild bibasilar atelectasis. Osseous structures are without acute abnormality. Soft tissues are grossly normal. IMPRESSION: 1. Mild bibasilar atelectasis versus peribronchial airspace consolidation. 2. Interval placement of large-bore central venous catheter which terminates at the cavoatrial junction. Electronically Signed   By: Fidela Salisbury M.D.   On: 09/04/2019 13:15        Scheduled Meds: . atorvastatin  80 mg Oral Daily  . budesonide (PULMICORT) nebulizer solution  0.25 mg Nebulization BID  . calcium acetate  1,334 mg Oral TID WC  . Chlorhexidine Gluconate Cloth  6 each Topical Daily  . feeding supplement (NEPRO CARB STEADY)  237 mL Oral BID BM  . gabapentin  300 mg Oral QHS  . gentamicin cream  1 application Topical Daily  . insulin aspart  0-6 Units Subcutaneous Q6H  . ipratropium-albuterol  3 mL Nebulization BID  .  metoprolol tartrate  5 mg Intravenous Q6H  . multivitamin  1 tablet Oral QHS  . pantoprazole (PROTONIX) IV  40 mg Intravenous Q12H  . sodium chloride flush  10-40 mL Intracatheter Q12H   Continuous Infusions: .  ceFAZolin (ANCEF) IV    . dextrose 35 mL/hr at 09/05/19 1119  . TPN ADULT (ION)       LOS: 14 days    Time spent: 35  minutes    Mardel Grudzien A Graciemae Delisle, MD [facility]   If 7PM-7AM, please contact night-coverage [email or web address]  09/05/2019, 3:14 PM

## 2019-09-05 NOTE — Anesthesia Preprocedure Evaluation (Addendum)
Anesthesia Evaluation  Patient identified by MRN, date of birth, ID band Patient awake    Reviewed: Allergy & Precautions, NPO status , Patient's Chart, lab work & pertinent test results  Airway Mallampati: II  TM Distance: >3 FB Neck ROM: Full    Dental  (+) Dental Advisory Given, Edentulous Upper, Edentulous Lower   Pulmonary COPD,  COPD inhaler, Current Smoker and Patient abstained from smoking.,    Pulmonary exam normal breath sounds clear to auscultation       Cardiovascular hypertension, Pt. on home beta blockers and Pt. on medications + CAD, + Past MI, + Peripheral Vascular Disease (Occluded left superficial femoral artery status post stent ) and +CHF  Normal cardiovascular exam+ dysrhythmias Atrial Fibrillation  Rhythm:Regular Rate:Normal  Echo 05/27/19: 1. Left ventricular ejection fraction, by estimation, is 50 to 55%. The  left ventricle has low normal function. The left ventricle has no regional  wall motion abnormalities. There is mildly increased left ventricular  hypertrophy. Left ventricular  diastolic parameters are indeterminate.  2. Right ventricular systolic function is normal. The right ventricular  size is normal.  3. Left atrial size was moderately dilated.  4. Right atrial size was mildly dilated.  5. The mitral valve is normal in structure and function. Trivial mitral  valve regurgitation. No evidence of mitral stenosis.  6. The aortic valve is tricuspid. Aortic valve regurgitation is not  visualized. Mild aortic valve sclerosis is present, with no evidence of  aortic valve stenosis.  7. The inferior vena cava is normal in size with greater than 50%  respiratory variability, suggesting right atrial pressure of 3 mmHg.   Neuro/Psych PSYCHIATRIC DISORDERS Anxiety Depression negative neurological ROS     GI/Hepatic GERD  Medicated,(+) Hepatitis -, CSMALL BOWEL OBSTRUCTION  Exploratory laparotomy,  LOA, repair of SB serosal injury x3, resection of mesenteric nodule 08/29/19   Endo/Other  negative endocrine ROS  Renal/GU ESRF and DialysisRenal disease (TTHS)     Musculoskeletal  (+) Arthritis ,   Abdominal   Peds  Hematology  (+) Blood dyscrasia (Eliquis), anemia ,   Anesthesia Other Findings Day of surgery medications reviewed with the patient.  Reproductive/Obstetrics                           Anesthesia Physical Anesthesia Plan  ASA: IV  Anesthesia Plan: General   Post-op Pain Management:    Induction: Intravenous  PONV Risk Score and Plan: 3 and Dexamethasone, Ondansetron and Midazolam  Airway Management Planned: Oral ETT  Additional Equipment:   Intra-op Plan:   Post-operative Plan: Possible Post-op intubation/ventilation  Informed Consent: I have reviewed the patients History and Physical, chart, labs and discussed the procedure including the risks, benefits and alternatives for the proposed anesthesia with the patient or authorized representative who has indicated his/her understanding and acceptance.   Patient has DNR.   Dental advisory given  Plan Discussed with: CRNA  Anesthesia Plan Comments:        Anesthesia Quick Evaluation

## 2019-09-05 NOTE — Anesthesia Procedure Notes (Signed)
Procedure Name: Intubation Date/Time: 09/05/2019 11:28 AM Performed by: Amadeo Garnet, CRNA Pre-anesthesia Checklist: Patient identified, Emergency Drugs available, Suction available and Patient being monitored Patient Re-evaluated:Patient Re-evaluated prior to induction Oxygen Delivery Method: Circle system utilized Preoxygenation: Pre-oxygenation with 100% oxygen Induction Type: IV induction Ventilation: Mask ventilation without difficulty and Oral airway inserted - appropriate to patient size Laryngoscope Size: Mac and 4 Grade View: Grade I Tube type: Oral Tube size: 7.0 mm Number of attempts: 1 Airway Equipment and Method: Stylet Placement Confirmation: ETT inserted through vocal cords under direct vision,  positive ETCO2 and breath sounds checked- equal and bilateral Secured at: 22 cm Tube secured with: Tape Dental Injury: Teeth and Oropharynx as per pre-operative assessment

## 2019-09-06 DIAGNOSIS — Z7189 Other specified counseling: Secondary | ICD-10-CM

## 2019-09-06 DIAGNOSIS — G8918 Other acute postprocedural pain: Secondary | ICD-10-CM

## 2019-09-06 DIAGNOSIS — Z515 Encounter for palliative care: Secondary | ICD-10-CM

## 2019-09-06 LAB — COMPREHENSIVE METABOLIC PANEL
ALT: <5 U/L (ref 0–44)
AST: 16 U/L (ref 15–41)
Albumin: 1.7 g/dL — ABNORMAL LOW (ref 3.5–5.0)
Alkaline Phosphatase: 71 U/L (ref 38–126)
Anion gap: 8 (ref 5–15)
BUN: 38 mg/dL — ABNORMAL HIGH (ref 8–23)
CO2: 25 mmol/L (ref 22–32)
Calcium: 7 mg/dL — ABNORMAL LOW (ref 8.9–10.3)
Chloride: 100 mmol/L (ref 98–111)
Creatinine, Ser: 4.07 mg/dL — ABNORMAL HIGH (ref 0.61–1.24)
GFR calc Af Amer: 17 mL/min — ABNORMAL LOW (ref >60)
GFR calc non Af Amer: 15 mL/min — ABNORMAL LOW (ref >60)
Glucose, Bld: 141 mg/dL — ABNORMAL HIGH (ref 70–99)
Potassium: 3.3 mmol/L — ABNORMAL LOW (ref 3.5–5.1)
Sodium: 133 mmol/L — ABNORMAL LOW (ref 135–145)
Total Bilirubin: 0.4 mg/dL (ref 0.3–1.2)
Total Protein: 4.8 g/dL — ABNORMAL LOW (ref 6.5–8.1)

## 2019-09-06 LAB — CBC
HCT: 29 % — ABNORMAL LOW (ref 39.0–52.0)
Hemoglobin: 9.3 g/dL — ABNORMAL LOW (ref 13.0–17.0)
MCH: 32.2 pg (ref 26.0–34.0)
MCHC: 32.1 g/dL (ref 30.0–36.0)
MCV: 100.3 fL — ABNORMAL HIGH (ref 80.0–100.0)
Platelets: 180 10*3/uL (ref 150–400)
RBC: 2.89 MIL/uL — ABNORMAL LOW (ref 4.22–5.81)
RDW: 14.7 % (ref 11.5–15.5)
WBC: 10.3 10*3/uL (ref 4.0–10.5)
nRBC: 0 % (ref 0.0–0.2)

## 2019-09-06 LAB — GLUCOSE, CAPILLARY
Glucose-Capillary: 119 mg/dL — ABNORMAL HIGH (ref 70–99)
Glucose-Capillary: 129 mg/dL — ABNORMAL HIGH (ref 70–99)
Glucose-Capillary: 133 mg/dL — ABNORMAL HIGH (ref 70–99)
Glucose-Capillary: 134 mg/dL — ABNORMAL HIGH (ref 70–99)
Glucose-Capillary: 144 mg/dL — ABNORMAL HIGH (ref 70–99)
Glucose-Capillary: 146 mg/dL — ABNORMAL HIGH (ref 70–99)

## 2019-09-06 LAB — PHOSPHORUS: Phosphorus: 3.1 mg/dL (ref 2.5–4.6)

## 2019-09-06 LAB — MAGNESIUM: Magnesium: 1.6 mg/dL — ABNORMAL LOW (ref 1.7–2.4)

## 2019-09-06 MED ORDER — ALBUTEROL SULFATE (2.5 MG/3ML) 0.083% IN NEBU
2.5000 mg | INHALATION_SOLUTION | RESPIRATORY_TRACT | Status: DC | PRN
  Administered 2019-09-07 – 2019-09-11 (×2): 2.5 mg via RESPIRATORY_TRACT
  Filled 2019-09-06 (×2): qty 3

## 2019-09-06 MED ORDER — APIXABAN 5 MG PO TABS
5.0000 mg | ORAL_TABLET | Freq: Two times a day (BID) | ORAL | Status: DC
  Administered 2019-09-06 – 2019-09-11 (×11): 5 mg via ORAL
  Filled 2019-09-06 (×11): qty 1

## 2019-09-06 MED ORDER — ALBUTEROL SULFATE (2.5 MG/3ML) 0.083% IN NEBU: 2.5000 mg | INHALATION_SOLUTION | Freq: Four times a day (QID) | RESPIRATORY_TRACT | Status: DC

## 2019-09-06 MED ORDER — POTASSIUM CHLORIDE 10 MEQ/100ML IV SOLN
10.0000 meq | INTRAVENOUS | Status: AC
  Administered 2019-09-06 (×3): 10 meq via INTRAVENOUS
  Filled 2019-09-06 (×3): qty 100

## 2019-09-06 MED ORDER — GUAIFENESIN-DM 100-10 MG/5ML PO SYRP
5.0000 mL | ORAL_SOLUTION | Freq: Three times a day (TID) | ORAL | Status: DC
  Administered 2019-09-06 – 2019-09-11 (×14): 5 mL via ORAL
  Filled 2019-09-06 (×14): qty 5

## 2019-09-06 MED ORDER — BOOST / RESOURCE BREEZE PO LIQD CUSTOM
1.0000 | Freq: Two times a day (BID) | ORAL | Status: DC
  Administered 2019-09-06 – 2019-09-07 (×4): 1 via ORAL

## 2019-09-06 MED ORDER — MAGNESIUM SULFATE 2 GM/50ML IV SOLN
2.0000 g | Freq: Once | INTRAVENOUS | Status: AC
  Administered 2019-09-06: 2 g via INTRAVENOUS
  Filled 2019-09-06: qty 50

## 2019-09-06 MED ORDER — DARBEPOETIN ALFA 60 MCG/0.3ML IJ SOSY
60.0000 ug | PREFILLED_SYRINGE | INTRAMUSCULAR | Status: DC
  Administered 2019-09-07: 60 ug via INTRAVENOUS
  Filled 2019-09-06: qty 0.3

## 2019-09-06 MED ORDER — FLUTICASONE FUROATE-VILANTEROL 200-25 MCG/INH IN AEPB
1.0000 | INHALATION_SPRAY | Freq: Every day | RESPIRATORY_TRACT | Status: DC | PRN
  Filled 2019-09-06: qty 28

## 2019-09-06 MED ORDER — TRACE MINERALS CU-MN-SE-ZN 300-55-60-3000 MCG/ML IV SOLN
INTRAVENOUS | Status: AC
  Filled 2019-09-06: qty 840

## 2019-09-06 NOTE — Progress Notes (Signed)
PROGRESS NOTE    Aaron [name]  OIZ:[id number] DOB: March 31, 1956 DOA: 08/22/2019 PCP: Gwenlyn Saran [place]   Brief Narrative: 64 year old with past medical history significant for hypertension, hyperlipidemia, chronic A. fib on Eliquis, ESRD on peritoneal dialysis presented to emergency department for evaluation of shortness of breath.  Patient does have a history of COPD, he recently quit smoking few months ago.  He does have some wheezing at baseline.  Came to the ER with worsening shortness of breath. In the emergency room, he was comfortable, found to have A. fib with RVR started on Cardizem drip.  [name].  On 5/13 patient is started having intermittent biliary vomiting.  CT scan showed a small bowel obstruction.  Surgery consulted in the morning of 514.  On 517 no improvement in the small bowel obstruction.  Patient went for exploratory laparotomy by surgery.  On 518 PICC line for TPN.  Right IJ line temporary catheter for hemodialysis.  Right IJ for hemodialysis not working, and to proceed with IR for tunneled catheter. Patient was notice to have drainage from abdominal wall incision. Underwent  for exploratory laparotomy, washout and placement of retention Sutures on 5/24.   Assessment & Plan:   Principal Problem:   COPD with acute exacerbation ([place]) Active Problems:   Essential hypertension   ESRD on dialysis ([place])   Exertional dyspnea   Acute exacerbation of CHF (congestive heart failure) (HCC)   Atrial fibrillation with RVR (HCC)   Malnutrition of moderate degree   Atrial fibrillation with rapid ventricular response (HCC)   ESRD on peritoneal dialysis ([place])   SBO (small bowel obstruction) ([place])   DNR (do not resuscitate)  1-COPD with acute exacerbation: Improved -Continue with bronchodilators as needed.  Off of oxygen. -report cough, schedule Guaifenesin, albuterol and resume breo-ellipta.   2-Small bowel  obstruction secondary to adhesion: -No improvement with conservative management.   -Status post exploratory laparotomy lysis of adhesions on 5/17.  -Holding  Eliquis and Plavix - -- TPN resume 5/24 -Leaking serous sanguineous fluid from incision. CT negative for large fascial defect. Showed sign of SBO.  CT concern for pSBO 5-22. NPO sisp with meds.  -Underwent repeat exploratory laparotomy, washout and placement of retention sutures 5/24. Started on clear diet today. Passing flatus, no BM  3-Chronic A. fib with RVR: On IV metoprolol Ok to resume eliquis per sx  ESRD  on peritoneal dialysis: Follow by Nephrology.  He will need to convert to hemodialysis through DC for 1 or 2 months after surgery 5/17  Cough; Leukocytosis. Started  ancef.  ? Cellulitis wound,  Chest  Ray: Mild bibasilar atelectasis versus peribronchial airspace consolidation.  WBC decreased to 10/   Hypoglycemia: He was a started on IV fluids D5, plan to transition to TPN Hypokalemia/Hypomagnesemia replete.  Nutrition Problem: Moderate Malnutrition Etiology: chronic illness(COPD, ESRD on PD)    Signs/Symptoms: energy intake < 75% for > or equal to 1 month, mild fat depletion, moderate fat depletion, mild muscle depletion, moderate muscle depletion    Interventions: TPN, Boost Breeze  Estimated body mass index is 22.03 kg/m as calculated from the following:   Height as of this encounter: 6\' 4"  (1.93 m).   Weight as of this encounter: 82.1 kg.   DVT prophylaxis: Holding Eliquis for surgery Code Status: Full code Family Communication: care discussed with patient Disposition Plan:  Status is: Inpatient  Now with ileus, awaiting bowel function   Dispo: The patient  is from: Home               Anticipated d/c is to: Home               Anticipated d/c date is: 2-3 days.               Patient currently ; not medical stable         Consultants:   General surgery   IR  Procedures:   none   Antimicrobials:    Subjective: He report cough, unable to expectorate cough.  Passing gas. No BM  Objective: Vitals:   09/06/19 0435 09/06/19 0438 09/06/19 0644 09/06/19 0740  BP: 126/85  120/70   Pulse: (!) 101  91   Resp: 18     Temp: 98.2 F (36.8 C)     TempSrc: Oral     SpO2: 99%   99%  Weight:  82.1 kg    Height:        Intake/Output Summary (Last 24 hours) at 09/06/2019 0906 Last data filed at 09/06/2019 0900 Gross per 24 hour  Intake 1882.06 ml  Output 1000 ml  Net 882.06 ml   Filed Weights   09/05/19 0200 09/05/19 0648 09/06/19 0438  Weight: 79.7 kg 78.2 kg 82.1 kg    Examination:  General exam; NAD Respiratory system:  CTA Cardiovascular system:  S 1, S 2 RRR Gastrointestinal system: BS decreased, abdominal wound cover, binder in place.  Central nervous system: Alert, following. command Extremities: no edema Skin: No rashes.    Data Reviewed: I have personally reviewed following labs and imaging studies  CBC: Recent Labs  Lab 09/02/19 0635 09/03/19 0529 09/04/19 0518 09/05/19 0511 09/06/19 0523  WBC 11.6* 12.3* 15.7* 22.2* 10.3  NEUTROABS  --   --   --  20.1*  --   HGB 11.6* 12.1* 11.7* 11.1* 9.3*  HCT 37.3* 37.4* 36.0* 34.0* 29.0*  MCV 111.7* 99.5 99.7 100.3* 100.3*  PLT 188 211 204 219 401   Basic Metabolic Panel: Recent Labs  Lab 08/30/19 1040 08/30/19 1040 08/30/19 1138 08/30/19 1138 08/31/19 0500 09/01/19 0625 09/03/19 0529 09/05/19 0511 09/06/19 0523  NA 138   < > 138   < > 137 135 136 129* 133*  K 3.8   < > 3.7   < > 3.6 3.6 4.6 5.1 3.3*  CL 98   < > 99   < > 98 99 99 93* 100  CO2 26   < > 26   < > 26 25 24  20* 25  GLUCOSE 114*   < > 123*   < > 92 84 80 95 141*  BUN 55*   < > 55*   < > 40* 65* 68* 93* 38*  CREATININE 6.84*   < > 6.77*   < > 5.19* 5.98* 5.45* 7.10* 4.07*  CALCIUM 7.7*   < > 7.5*   < > 7.6* 7.6* 8.3* 7.9* 7.0*  MG 1.9  --   --   --  1.8 1.8  --  1.7 1.6*  PHOS 6.5*   < > 6.3*  --  3.7 3.3  --  4.6 3.1    < > = values in this interval not displayed.   GFR: Estimated Creatinine Clearance: 21.6 mL/min (A) (by C-G formula based on SCr of 4.07 mg/dL (H)). Liver Function Tests: Recent Labs  Lab 08/30/19 1040 08/30/19 1040 08/30/19 1138 08/31/19 0500 09/01/19 0625 09/05/19 0511 09/06/19 0523  AST 12*  --   --  16 13* 14* 16  ALT <5  --   --  6 <5 6 <5  ALKPHOS 65  --   --  71 59 78 71  BILITOT 0.7  --   --  0.8 0.4 0.7 0.4  PROT 5.5*  --   --  5.7* 5.1* 5.3* 4.8*  ALBUMIN 2.2*   < > 2.0* 2.1* 1.9* 1.8* 1.7*   < > = values in this interval not displayed.   No results for input(s): LIPASE, AMYLASE in the last 168 hours. No results for input(s): AMMONIA in the last 168 hours. Coagulation Profile: No results for input(s): INR, PROTIME in the last 168 hours. Cardiac Enzymes: No results for input(s): CKTOTAL, CKMB, CKMBINDEX, TROPONINI in the last 168 hours. BNP (last 3 results) No results for input(s): PROBNP in the last 8760 hours. HbA1C: No results for input(s): HGBA1C in the last 72 hours. CBG: Recent Labs  Lab 09/05/19 1613 09/05/19 2013 09/06/19 0002 09/06/19 0432 09/06/19 0723  GLUCAP 111* 137* 144* 133* 129*   Lipid Profile: Recent Labs    09/05/19 0511  TRIG 96   Thyroid Function Tests: No results for input(s): TSH, T4TOTAL, FREET4, T3FREE, THYROIDAB in the last 72 hours. Anemia Panel: No results for input(s): VITAMINB12, FOLATE, FERRITIN, TIBC, IRON, RETICCTPCT in the last 72 hours. Sepsis Labs: No results for input(s): PROCALCITON, LATICACIDVEN in the last 168 hours.  No results found for this or any previous visit (from the past 240 hour(s)).       Radiology Studies: DG CHEST PORT 1 VIEW  Result Date: 09/04/2019 CLINICAL DATA:  Cough. EXAM: PORTABLE CHEST 1 VIEW COMPARISON:  Aug 29, 2019 FINDINGS: Interval placement of large-bore catheter which terminates at the cavoatrial junction. Cardiomediastinal silhouette is normal. Mediastinal contours appear  intact. There is no evidence of focal airspace consolidation, pleural effusion or pneumothorax. Mild bibasilar atelectasis. Osseous structures are without acute abnormality. Soft tissues are grossly normal. IMPRESSION: 1. Mild bibasilar atelectasis versus peribronchial airspace consolidation. 2. Interval placement of large-bore central venous catheter which terminates at the cavoatrial junction. Electronically Signed   By: Fidela Salisbury M.D.   On: 09/04/2019 13:15        Scheduled Meds: . albuterol  2.5 mg Nebulization Q6H  . atorvastatin  80 mg Oral Daily  . budesonide (PULMICORT) nebulizer solution  0.25 mg Nebulization BID  . calcium acetate  1,334 mg Oral TID WC  . Chlorhexidine Gluconate Cloth  6 each Topical Daily  . feeding supplement (NEPRO CARB STEADY)  237 mL Oral BID BM  . gabapentin  300 mg Oral QHS  . gentamicin cream  1 application Topical Daily  . guaiFENesin-dextromethorphan  5 mL Oral Q8H  . insulin aspart  0-6 Units Subcutaneous Q6H  . ipratropium-albuterol  3 mL Nebulization BID  . metoprolol tartrate  5 mg Intravenous Q6H  . multivitamin  1 tablet Oral QHS  . pantoprazole (PROTONIX) IV  40 mg Intravenous Q12H  . sodium chloride flush  10-40 mL Intracatheter Q12H   Continuous Infusions: .  ceFAZolin (ANCEF) IV Stopped (09/05/19 1859)  . TPN ADULT (ION) 35 mL/hr at 09/06/19 0617     LOS: 15 days    Time spent: 35  minutes    Avila Albritton A Stephanos Fan, MD [facility]   If 7PM-7AM, please contact night-coverage [email or web address]  09/06/2019, 9:06 AM

## 2019-09-06 NOTE — Progress Notes (Signed)
PT Cancellation Note  Patient Details Name: Aaron [name] MRN: [id number] DOB: [birth year]   Cancelled Treatment:    Reason Eval/Treat Not Completed: Fatigue/lethargy limiting ability to participate;Pain limiting ability to participate - pt complaining of abdominal discomfort, states he wishes to mobilize tomorrow. PT to check back.   [place] Pager [phone]  Office [phone]    [place] 09/06/2019, 4:53 PM

## 2019-09-06 NOTE — Progress Notes (Addendum)
[facility] Progress Note  1 Day Post-Op  Subjective: Reports abdominal soreness that only slightly improves with medication. Belching. Denies flatus or BM.  Abdominal binder in place.  AFVSS Objective: Vital signs in last 24 hours: Temp:  [97.5 F (36.4 C)-99 F (37.2 C)] 98.2 F (36.8 C) (05/25 0435) Pulse Rate:  [60-120] 91 (05/25 0644) Resp:  [16-20] 18 (05/25 0435) BP: (69-164)/(30-88) 120/70 (05/25 0644) SpO2:  [90 %-99 %] 99 % (05/25 0435) Weight:  [82.1 kg] 82.1 kg (05/25 0438) Last BM Date: 09/03/19  Intake/Output from previous day: 05/24 0701 - 05/25 0700 In: 1662.1 [P.O.:700; I.V.:662.1; IV Piggyback:300] Out: 1000  Intake/Output this shift: No intake/output data recorded.  PE: General: chronically ill appearing white male who is sitting in bed in NAD Heart: regular, rate, and rhythm. No obvious murmurs, gallops, or rubs noted.  Palpable radial and pedal pulses bilaterally Lungs: CTAB.  Respiratory effort nonlabored Abd: soft, protuberant, appropriately tender, midline incision closed with staples and 3 retention sutures that remain in tact. There is no drainage. There is <1cm erythema surrounding staples - monitor.  Lab Results:  Recent Labs    09/05/19 0511 09/06/19 0523  WBC 22.2* 10.3  HGB 11.1* 9.3*  HCT 34.0* 29.0*  PLT 219 180   BMET Recent Labs    09/05/19 0511 09/06/19 0523  NA 129* 133*  K 5.1 3.3*  CL 93* 100  CO2 20* 25  GLUCOSE 95 141*  BUN 93* 38*  CREATININE 7.10* 4.07*  CALCIUM 7.9* 7.0*   PT/INR No results for input(s): LABPROT, INR in the last 72 hours. CMP     Component Value Date/Time   NA 133 (L) 09/06/2019 0523   K 3.3 (L) 09/06/2019 0523   CL 100 09/06/2019 0523   CO2 25 09/06/2019 0523   GLUCOSE 141 (H) 09/06/2019 0523   BUN 38 (H) 09/06/2019 0523   CREATININE 4.07 (H) 09/06/2019 0523   CALCIUM 7.0 (L) 09/06/2019 0523   PROT 4.8 (L) 09/06/2019 0523   ALBUMIN 1.7 (L) 09/06/2019 0523   AST 16  09/06/2019 0523   ALT <5 09/06/2019 0523   ALKPHOS 71 09/06/2019 0523   BILITOT 0.4 09/06/2019 0523   GFRNONAA 15 (L) 09/06/2019 0523   GFRAA 17 (L) 09/06/2019 0523   Lipase     Component Value Date/Time   LIPASE 23 08/25/2019 1740       Studies/Results: DG CHEST PORT 1 VIEW  Result Date: 09/04/2019 CLINICAL DATA:  Cough. EXAM: PORTABLE CHEST 1 VIEW COMPARISON:  Aug 29, 2019 FINDINGS: Interval placement of large-bore catheter which terminates at the cavoatrial junction. Cardiomediastinal silhouette is normal. Mediastinal contours appear intact. There is no evidence of focal airspace consolidation, pleural effusion or pneumothorax. Mild bibasilar atelectasis. Osseous structures are without acute abnormality. Soft tissues are grossly normal. IMPRESSION: 1. Mild bibasilar atelectasis versus peribronchial airspace consolidation. 2. Interval placement of large-bore central venous catheter which terminates at the cavoatrial junction. Electronically Signed   By: Fidela Salisbury M.D.   On: 09/04/2019 13:15    Anti-infectives: Anti-infectives (From admission, onward)   Start     Dose/Rate Route Frequency Ordered Stop   09/05/19 1800  ceFAZolin (ANCEF) IVPB 1 g/50 mL premix     1 g 100 mL/hr over 30 Minutes Intravenous Every 24 hours 09/04/19 0939     09/04/19 1030  ceFAZolin (ANCEF) IVPB 1 g/50 mL premix     1 g 100 mL/hr over 30 Minutes Intravenous  Once 09/04/19 4401 09/04/19  1629   08/31/19 1415  ceFAZolin (ANCEF) IVPB 2g/100 mL premix     2 g 200 mL/hr over 30 Minutes Intravenous  Once 08/31/19 1328 08/31/19 1358   08/31/19 1400  ceFAZolin (ANCEF) powder 2 g  Status:  Discontinued     2 g Other To Surgery 08/31/19 1314 08/31/19 1326   08/29/19 1145  ceFAZolin (ANCEF) IVPB 2g/100 mL premix     2 g 200 mL/hr over 30 Minutes Intravenous To ShortStay Surgical 08/29/19 1127 08/29/19 1445   08/23/19 2200  azithromycin (ZITHROMAX) tablet 500 mg  Status:  Discontinued    Note to  Pharmacy: Pharmacy to adjust timing for next dose.   500 mg Oral Daily at bedtime 08/23/19 0233 08/25/19 2046   08/22/19 2130  azithromycin (ZITHROMAX) 500 mg in sodium chloride 0.9 % 250 mL IVPB     500 mg 250 mL/hr over 60 Minutes Intravenous  Once 08/22/19 2122 08/22/19 2245       Assessment/Plan CHF COPD A fib  Polycystic kidney disease, ESRD, on PD -  converted to HD temporarily H/O MI - plavix on hold as of 5/13 CAD Hep C HLD  SBO POD#8 exploratory laparotomy, LOA, repair of SB serosal injury x3, resection of mesenteric nodule 08/29/19 Dr. Bobbye Morton POD#1 exploratory laparotomy, washout, abdominal wall closure with placement of retention sutures for fascial dehiscence  - AFVSS -  belching, no flatus/BM, suspect ileus  - allow CLD and await further return of bowel function. - continue abdominal binder  - OOB/mobilize, PT/OT - IS 10x q 1h   FEN - CLD, carb steady feeding supplement BID, continue TPN  VTE - SCDs, ok to resume eliquis from surgical perspective ID -Ancef for cellulitis 5/23 >>    LOS: 15 days     Jill Alexanders , [facility] 09/06/2019, 7:14 AM Please see Amion for pager number during day hours 7:00am-4:30pm   LOS: 15 days    Jill Alexanders 09/06/2019

## 2019-09-06 NOTE — Progress Notes (Signed)
[facility] Progress Note     Assessment/ Plan:   1. SOB/COPD/pulmonary edema-Felt to be multifactorial - COPD/ Afib with rapid rate and volume overload. Converted to HD for ultrafiltration.  S/p Steroids/inhalers/azithro.  Improved.  2. ESRD-secondary to polycystic kidney disease. Has been on PD for 6 mos. followed in [place]. Albumin is low which can be problematic in PD and UF.   -Appreciate the [facility] TC 5/19 by VIR.  HD tomorrow.  Has spot at [facility] 2nd shift (MWF regimen) 1145AM. --> UF 2L with HD but may need more given TPN, check  I/Os tomorrow.   3. Anemia- Hb 9s, start low dose ESA.  Will check iron.  4. Secondary hyperparathyroidism - calc OK- Phos4.6. We will continue to monitor; on phoslo 5. HTN/volume-continue carvedilol and diltiazem for atrial fibrillation.  6. Leukocytosis:much improved from 22 to 10 after abd washout + ancef for cellulitis.  PD fluid had been negative for infection earlier in hospitalization 7. Partial small bowel obstruction: Better now, tolerating PO. Surgery following and appreciate assistance; dense adhesions on 5/17. Back to OR yesterday.  8. Hyperdense kidney cyst: Consider further evaluation with MRI in the future. 9.  Hypokalemia:  Replete today, 3K dialysate tomorrow.   [place] for d/c from renal perspective.  Subjective:   S/p OR yesterday after HD with washout, closure with retention sutures; on ancef for cellulitis.  Awaiting return of bowel function.  He has no complaints except difficulty sitting during HD process due to back pain.  On TPN now.    Objective:   BP 120/70 (BP Location: Right Arm)   Pulse 91   Temp 98.2 F (36.8 C) (Oral)   Resp 18   Ht 6\' 4"  (1.93 m)   Wt 82.1 kg   SpO2 99%   BMI 22.03 kg/m   Intake/Output Summary (Last 24 hours) at 09/06/2019 1024 Last data filed at 09/06/2019 0900 Gross per 24 hour  Intake 1882.06 ml  Output 0 ml  Net 1882.06 ml   Weight change: 2.4  kg  Physical Exam: General:supine inbed on HD Heart:RRR Lungs:Coarse bilateral breath sounds, no increased work of breathing Abdomen:Distented with abdominal binder in place Extremities: Trace edema Dialysis Access: PD cath,[facility] TC 5/19 Access: Lt arm PICC  Imaging: DG CHEST PORT 1 VIEW  Result Date: 09/04/2019 CLINICAL DATA:  Cough. EXAM: PORTABLE CHEST 1 VIEW COMPARISON:  Aug 29, 2019 FINDINGS: Interval placement of large-bore catheter which terminates at the cavoatrial junction. Cardiomediastinal silhouette is normal. Mediastinal contours appear intact. There is no evidence of focal airspace consolidation, pleural effusion or pneumothorax. Mild bibasilar atelectasis. Osseous structures are without acute abnormality. Soft tissues are grossly normal. IMPRESSION: 1. Mild bibasilar atelectasis versus peribronchial airspace consolidation. 2. Interval placement of large-bore central venous catheter which terminates at the cavoatrial junction. Electronically Signed   By: Fidela Salisbury M.D.   On: 09/04/2019 13:15    Labs: BMET Recent Labs  Lab 08/30/19 1040 08/30/19 1138 08/31/19 0500 09/01/19 0625 09/03/19 0529 09/05/19 0511 09/06/19 0523  NA 138 138 137 135 136 129* 133*  K 3.8 3.7 3.6 3.6 4.6 5.1 3.3*  CL 98 99 98 99 99 93* 100  CO2 26 26 26 25 24  20* 25  GLUCOSE 114* 123* 92 84 80 95 141*  BUN 55* 55* 40* 65* 68* 93* 38*  CREATININE 6.84* 6.77* 5.19* 5.98* 5.45* 7.10* 4.07*  CALCIUM 7.7* 7.5* 7.6* 7.6* 8.3* 7.9* 7.0*  PHOS 6.5* 6.3* 3.7 3.3  --  4.6  3.1   CBC Recent Labs  Lab 09/03/19 0529 09/04/19 0518 09/05/19 0511 09/06/19 0523  WBC 12.3* 15.7* 22.2* 10.3  NEUTROABS  --   --  20.1*  --   HGB 12.1* 11.7* 11.1* 9.3*  HCT 37.4* 36.0* 34.0* 29.0*  MCV 99.5 99.7 100.3* 100.3*  PLT 211 204 219 180    Medications:    . atorvastatin  80 mg Oral Daily  . budesonide (PULMICORT) nebulizer solution  0.25 mg Nebulization BID  . calcium acetate  1,334 mg Oral  TID WC  . Chlorhexidine Gluconate Cloth  6 each Topical Daily  . feeding supplement  1 Container Oral BID BM  . gabapentin  300 mg Oral QHS  . gentamicin cream  1 application Topical Daily  . guaiFENesin-dextromethorphan  5 mL Oral Q8H  . insulin aspart  0-6 Units Subcutaneous Q6H  . ipratropium-albuterol  3 mL Nebulization BID  . metoprolol tartrate  5 mg Intravenous Q6H  . multivitamin  1 tablet Oral QHS  . pantoprazole (PROTONIX) IV  40 mg Intravenous Q12H  . sodium chloride flush  10-40 mL Intracatheter Q12H     Jannifer Hick MD [facility] Pager [phone]

## 2019-09-06 NOTE — Progress Notes (Signed)
Daily Progress Note   Aaron [name] Name: Aaron [name]       Date: 09/06/2019 DOB: 1956/02/27  Age: 64 y.o. MRN#: [id number] Attending Physician: Elmarie Shiley, MD Primary Care Physician: Gwenlyn Saran [place] Date: 08/22/2019  Reason for Consultation/Follow-up: Establishing goals of care, Pain control and Psychosocial/spiritual support  Subjective: Went to visit Aaron [name] at bedside - Aaron [name] was awake and alert. Pain is his only acute concern.   Length of Stay: 15  Current Medications: Scheduled Meds:  . atorvastatin  80 mg Oral Daily  . budesonide (PULMICORT) nebulizer solution  0.25 mg Nebulization BID  . calcium acetate  1,334 mg Oral TID WC  . Chlorhexidine Gluconate Cloth  6 each Topical Daily  . feeding supplement  1 Container Oral BID BM  . gabapentin  300 mg Oral QHS  . gentamicin cream  1 application Topical Daily  . guaiFENesin-dextromethorphan  5 mL Oral Q8H  . insulin aspart  0-6 Units Subcutaneous Q6H  . ipratropium-albuterol  3 mL Nebulization BID  . metoprolol tartrate  5 mg Intravenous Q6H  . multivitamin  1 tablet Oral QHS  . pantoprazole (PROTONIX) IV  40 mg Intravenous Q12H  . sodium chloride flush  10-40 mL Intracatheter Q12H    Continuous Infusions: .  ceFAZolin (ANCEF) IV Stopped (09/05/19 1859)  . TPN ADULT (ION) 35 mL/hr at 09/06/19 0617  . TPN ADULT (ION)      PRN Meds: acetaminophen **OR** acetaminophen, albuterol, fluticasone furoate-vilanterol, morphine injection, ondansetron **OR** ondansetron (ZOFRAN) IV, oxyCODONE-acetaminophen, sodium chloride flush, zolpidem  Physical Exam Abdominal:     Tenderness: There is generalized abdominal tenderness.  Skin:    General: Skin is warm and dry.  Neurological:     Mental Status: Aaron [name] is alert.   Psychiatric:        Mood and Affect: Mood normal.        Behavior: Behavior normal. Behavior is cooperative.             Vital Signs: BP 120/70 (BP Location: Right Arm)   Pulse 91   Temp 98.2 F (36.8 C) (Oral)   Resp 18   Ht 6\' 4"  (1.93 m)   Wt 82.1 kg   SpO2 99%   BMI 22.03 kg/m  SpO2: SpO2: 99 % O2 Device: O2 Device: Nasal Cannula O2 Flow Rate: O2 Flow Rate (L/min): 2 L/min  Intake/output summary:   Intake/Output Summary (Last 24 hours) at 09/06/2019 1017 Last data filed at 09/06/2019 0900 Gross per 24 hour  Intake 1882.06 ml  Output 1000 ml  Net 882.06 ml   LBM: Last BM Date: 09/03/19 Baseline Weight: Weight: 86.6 kg Most recent weight: Weight: 82.1 kg       Palliative Assessment/Data: PPS 60%      Aaron [name] Active Problem List   Diagnosis Date Noted  . Atrial fibrillation with rapid ventricular response ([place])   . ESRD on peritoneal dialysis ([place])   . SBO (small bowel obstruction) ([place])   . DNR (do not resuscitate)   . Malnutrition of moderate degree 08/31/2019  . Acute exacerbation of CHF (congestive heart failure) ([place]) 08/23/2019  . Atrial fibrillation with RVR ([place]) 08/23/2019  . Exertional dyspnea 08/22/2019  .  Hyperglycemia 05/27/2019  . Syncope 05/26/2019  . Vertebral osteomyelitis ([place]) 04/02/2018  . Back pain 03/31/2018  . Noncompliance with medication regimen 03/31/2018  . CKD (chronic kidney disease) stage 5, GFR less than 15 ml/min (H[facility]) 02/03/2018  . COPD with acute exacerbation ([place]) 02/03/2018  . Atrial fibrillation, chronic ([place]) 02/03/2018  . Discitis 01/27/2018  . Discitis of lumbar region 01/09/2018  . Paraspinal abscess ([place]) 01/08/2018  . ESRD on dialysis ([place]) 12/30/2017  . Hematoma of arm, right, initial encounter 11/23/2017  . Protein-calorie malnutrition, severe 10/15/2017  . Hyperlipidemia 08/24/2017  . Schatzki's ring of distal esophagus 04/27/2017  . Encounter for therapeutic drug monitoring 03/03/2017  . History of  hepatitis C, naturally cleared  02/20/2017  . Ischemic cardiomyopathy 01/27/2017  . Dyslipidemia 01/27/2017  . AF (paroxysmal atrial fibrillation) ([place]) 01/27/2017  . CAD (coronary artery disease) 01/21/2017  . Polycystic kidney disease 12/14/2016  . Syncope, vasovagal 09/23/2014  . COPD (chronic obstructive pulmonary disease) ([place])   . COLD (chronic obstructive lung disease) ([place])   . Depression   . PAD (peripheral artery disease) ([place]) 09/19/2014  . Preop cardiovascular exam 10/28/2010  . Tobacco abuse 10/28/2010  . UNSPECIFIED IRON DEFICIENCY ANEMIA 10/04/2009  . 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  . Essential hypertension 10/04/2009  . Esophageal reflux 10/04/2009  . PRECORDIAL PAIN 10/04/2009    Palliative Care Assessment & Plan   HPI: 64 y.o. male  with past medical history of afib, COPD, PAD, HFpEF, ESRD on peritoneal dialysis, and hep C, who was admitted on 08/22/2019 with shortness of breath thought to be a COPD exacerbation.  In the 5/12 - 5/13 time frame the Aaron [name] developed bilious vomiting and imaging showed a PSBO.  Conservative management was tried but did not result in improvement.  Aaron [name] underwent surgery on 5/17.   Aaron [name] has been changed to hemodialysis for approximately two months post op.  Afterward it is anticipated that Aaron [name] can return to PD.  Aaron [name] was able to walk 68' with a RW.  Aaron [name] is tolerating fluids PO.  PMT was called as the Aaron [name] expressed that Aaron [name] did not want to continue HD.  5/24: Exploratory laparotomy, closure abdominal wall, placement of retention sutures due to fascial dehiscence  5/24: RIJ for HD stopped working, IR to place tunneled catheter  Assessment:  Aaron [name] expressed frustration with surgical and dialysis site complications Aaron [name] has had since last week. Aaron [name] has a clear understanding of procedures that have been performed and their necessity. Aaron [name] is disappointed Aaron [name] now will not be able to return home to see his family until later this  week.   Overall, Aaron [name] states that Aaron [name] is doing well but says pain is his main concern right now. Active medication orders were reviewed and discussed with him. A pain management plan was discussed with Aaron [name] and RN - both in agreement. 2mg  of PRN morphine has not been managing his pain - encouraged Aaron [name] to try 4mg  of PRN morphine and supplement with PRN percocet to keep pain at an acceptable level.   Aaron [name] stated that Aaron [name] has had conversations with his family around stopping dialysis. His goals are clear at this time - Aaron [name] stated if Aaron [name] is unable to "take care of myself and wipe my own butt" Aaron [name] wants to be kept comfortable. Aaron [name] is okay continuing dialysis at this point, but it is clear Aaron [name] does not want to be a burden on his family in the future if unable to perform ADLs, which is when Aaron [name]  would want to discontinue dialysis. Aaron [name] expresses that the conversation Aaron [name] had with his family went well and that they are supportive of his decisions. Aaron [name] stated that Aaron [name] completed a Living Will yesterday and wanted his daughter/Whitney Beckstead is to be his primary medical decision maker if Aaron [name] is unable to make decisions for himself.   All questions and concerns were addressed. Encouraged to call with any other questions or concerns.  Aaron [name] expressed appreciation for visit Aaron.  Recommendations/Plan:  Monitor and follow-up on pain management plan for effectiveness - adjust pain medications if needed/pain not controlled  Continue DNR   Continue hemodialysis at this time with plan to convert back to PD when able  If Aaron [name] is unable to perform ADLs, feed himself, or clean himself up after the bathroom Aaron [name] would like for comfort measures to be initiated allowing for a natural death with dignity.   Daughter/Whitney Spoto to be HCPOA  Referral for outpatient Palliative Care through [facility] has been initiated   Goals of Care and Additional Recommendations:  Limitations on Scope of Treatment: Full  Scope Treatment  Code Status:  DNR  Prognosis:   Unable to determine  Discharge Planning:  Home with [place] was discussed with primary RN  Thank you for allowing the Palliative Medicine Team to assist in the care of this Aaron [name].   Total Time 25 minutes Prolonged Time Billed  no       Greater than 50%  of this time was spent counseling and coordinating care related to the above assessment and plan.  Amber M. Smith FNP-BC  [place], DNP, [facility] Palliative Medicine Team Team Phone # [phone]  Pager [phone]

## 2019-09-06 NOTE — Progress Notes (Addendum)
PHARMACY - TOTAL PARENTERAL NUTRITION CONSULT NOTE   Indication: Recurrent Small bowel obstruction, post-op ileus  Patient Measurements: Height: 6\' 4"  (193 cm) Weight: 82.1 kg (181 lb) IBW/kg (Calculated) : 86.8 TPN AdjBW (KG): 81.5 Body mass index is 22.03 kg/m. Usual Weight: 180 lb  Assessment:  64 yo M admitted for COPD exacerbation and afib who developed SBO on 08/25/19; however, this resolved and the patient had a BM on 5/21. The patient tolerated po intake well 5/20-5/21, and TPN was stopped on 5/21. The patient has had increased belching, minimal flatus, and concern for recurrent pSBO based on 5/22 Abd CT per surgery. Pharmacy has been consulted to resume TPN.  Glucose / Insulin: A1c 5.9, CBGs controlled on TPN. Hypoglycemic episodes 5/23 off TPN. No SSI usage in last 24 hrs Electrolytes: Na up to 133, K 3.3 (goal >/=4 with ileus/afib), Mag 1.6 (goal >/=2 with ileus/afib); others WNL Renal: ESRD on PD, switch to HD per Gen Surg due to suboptimal dialysis on PD and abdominal surgery. Last HD 5/24 (has MWF outpatient spot) LFTs / TGs: LFTs / Tbili / TG WNL Prealbumin / albumin: Prealbumin 12.5, Albumin 1.7  Intake / Output; MIVF: LBM 5/22, currently belching but no flatus/BM GI Imaging: Per surgery: suggestion of pSBO on CT A/P 5/22 Surgeries / Procedures:  5/17 ex lap - lysis of adhesions, small bowel serosal repair x3, resection of mesenteric nodule 5/24 ex-lap, washout, placement of retention sutures due to increased drainage and risk of evisceration  Central access: PICC line 08/29/19 TPN start date: 5/17 - 5/20; 5/24 >>  Nutritional Goals (RD recommendation 5/21): Kcal: 9357-0177; Protein: 125-140 grams; Fluid: minimize for ESRD  Goal TPN rate (concentrated) is 75 mL/hr (provides 126g of protein, 360g dextrose, 72g lipids and 2448 kcals per day)  Current Nutrition:  CLD started 5/24 PM - 100% of evening meal charted TPN  Plan:  Increase concentrated TPN to goal rate 75  mL/hr at 1800 -TPN will meet 100% of patient needs. Continue full TPN for now per discussion with General Surgery Electrolytes in TPN: standard 61mEq/L of Na, Mag 3 meq/L (will receive more Mag/Na with TPN rate increase), remove K/Ca/Phos for now in ESRD on HD. Cl:Ac ratio 1:2 Continue PO Renal MVI + add trace elements to TPN. Remove chromium in ESRD requiring RRT Continue very sensitive q6h SSI and adjust as needed  Monitor TPN labs F/u Surgery plans, ability to tolerate PO and wean TPN  Nephrology ordered K runs x 3, planning 3K dialysate tomorrow    Arturo Morton, PharmD, BCPS Please check AMION for all [place] contact numbers Clinical Pharmacist 09/06/2019 7:46 AM

## 2019-09-06 NOTE — Progress Notes (Signed)
Nutrition Follow-up  RD working remotely.  DOCUMENTATION CODES:   Non-severe (moderate) malnutrition in context of chronic illness  INTERVENTION:   -TPN management per pharmacy -D/c Nepro -Boost Breeze po BID, each supplement provides 250 kcal and 9 grams of protein -Continue renal MVI daily  NUTRITION DIAGNOSIS:   Moderate Malnutrition related to chronic illness(COPD, ESRD on PD) as evidenced by energy intake < 75% for > or equal to 1 month, mild fat depletion, moderate fat depletion, mild muscle depletion, moderate muscle depletion.  Ongoing  GOAL:   Patient will meet greater than or equal to 90% of their needs  Progressing   MONITOR:   PO intake, Supplement acceptance, Diet advancement, Labs, Weight trends, Skin, I & O's  REASON FOR ASSESSMENT:   Rounds, Other (Comment)(New TPN)    ASSESSMENT:   Aaron [name] is a 64 y.o. male with medical history significant of hypertension hyperlipidemia, atrial fibrillation on Eliquis, ESRD on peritoneal dialysis presented to ED for evaluation of worsening shortness of breath  5/13- CT reveals partial SBO 5/14- NGT placed 5/16- clamp NGT, advanced to clear liquid diet 5/17- NPO, s/pProcedure:exploratory laparotomy, lysis of adhesions, small bowel serosal repair x3, resection of mesenteric nodule, attempted central venous catheter placement; PICC placed; TPN initiated 5/18- s/p placement of temporary IJ trialysis catheter 5/19- NGT removed, advanced to clear liquid diet, s/p tunnelled HD cath placement, advanced to full liquid diet 5/20- advanced to soft diet  5/22- leakage from abdominal incision; CT of abdomen and pelvis revealed no large fascial defect  5/24- TPN restarted; s/p Exploratory laparotomy, closure abdominal wall, placement of retention sutures  Reviewed I/O's: +662 ml x 24 hours and +3.7 L since admission  UOP: 0 ml x 24 hours   Attempted to speak with pt via phone, however, no answer.   Pt currently on  clear liquid diet; tolerating well. Per general surgery notes, awaiting return of bowel function for diet advancement. Noted meal completion 100%.   TPN re-started yesterday (09/05/19). Pt currently receiving TPN at 35 ml/hr, which provides 1142 kcals and 59 grams protein, meeting 47% of estimated kcal and protein needs.   Medications reviewed and include phoslo.   Labs reviewed: CBGS: 129-144 (inpatient orders for glycemic control are 0-6 units insulin aspart every 4 hours).   Diet Order:   Diet Order            Diet clear liquid Room service appropriate? Yes; Fluid consistency: Thin  Diet effective now              EDUCATION NEEDS:   Education needs have been addressed  Skin:  Skin Assessment: Skin Integrity Issues: Skin Integrity Issues:: Incisions Incisions: closed rt neck, abdomen  Last BM:  09/03/19  Height:   Ht Readings from Last 1 Encounters:  08/29/19 6\' 4"  (1.93 m)    Weight:   Wt Readings from Last 1 Encounters:  09/06/19 82.1 kg    Ideal Body Weight:  91.8 kg  BMI:  Body mass index is 22.03 kg/m.  Estimated Nutritional Needs:   Kcal:  9480-1655  Protein:  125-140 grams  Fluid:  1000 mg + UOP    Loistine Chance, RD, LDN, CDCES Registered Dietitian II Certified Diabetes Care and Education Specialist Please refer to [facility] for RD and/or RD on-call/weekend/after hours pager

## 2019-09-06 NOTE — Progress Notes (Signed)
Renal Navigator provided update to patient's OP HD clinic to provide continuity of care. Navigator continuing to follow closely, as he is transitioning from PD to HD and will be a new start on HD in the clinic at discharge.  Alphonzo Cruise, [place] Renal Navigator [phone]

## 2019-09-07 DIAGNOSIS — E876 Hypokalemia: Secondary | ICD-10-CM

## 2019-09-07 DIAGNOSIS — E871 Hypo-osmolality and hyponatremia: Secondary | ICD-10-CM

## 2019-09-07 DIAGNOSIS — D631 Anemia in chronic kidney disease: Secondary | ICD-10-CM

## 2019-09-07 DIAGNOSIS — E440 Moderate protein-calorie malnutrition: Secondary | ICD-10-CM

## 2019-09-07 DIAGNOSIS — N189 Chronic kidney disease, unspecified: Secondary | ICD-10-CM

## 2019-09-07 LAB — CBC
HCT: 30.7 % — ABNORMAL LOW (ref 39.0–52.0)
Hemoglobin: 9.7 g/dL — ABNORMAL LOW (ref 13.0–17.0)
MCH: 32.1 pg (ref 26.0–34.0)
MCHC: 31.6 g/dL (ref 30.0–36.0)
MCV: 101.7 fL — ABNORMAL HIGH (ref 80.0–100.0)
Platelets: 211 10*3/uL (ref 150–400)
RBC: 3.02 MIL/uL — ABNORMAL LOW (ref 4.22–5.81)
RDW: 14.7 % (ref 11.5–15.5)
WBC: 11.4 10*3/uL — ABNORMAL HIGH (ref 4.0–10.5)
nRBC: 0 % (ref 0.0–0.2)

## 2019-09-07 LAB — GLUCOSE, CAPILLARY
Glucose-Capillary: 116 mg/dL — ABNORMAL HIGH (ref 70–99)
Glucose-Capillary: 123 mg/dL — ABNORMAL HIGH (ref 70–99)
Glucose-Capillary: 125 mg/dL — ABNORMAL HIGH (ref 70–99)
Glucose-Capillary: 125 mg/dL — ABNORMAL HIGH (ref 70–99)
Glucose-Capillary: 129 mg/dL — ABNORMAL HIGH (ref 70–99)
Glucose-Capillary: 78 mg/dL (ref 70–99)

## 2019-09-07 LAB — RENAL FUNCTION PANEL
Albumin: 1.6 g/dL — ABNORMAL LOW (ref 3.5–5.0)
Anion gap: 10 (ref 5–15)
BUN: 54 mg/dL — ABNORMAL HIGH (ref 8–23)
CO2: 22 mmol/L (ref 22–32)
Calcium: 7.1 mg/dL — ABNORMAL LOW (ref 8.9–10.3)
Chloride: 97 mmol/L — ABNORMAL LOW (ref 98–111)
Creatinine, Ser: 4.8 mg/dL — ABNORMAL HIGH (ref 0.61–1.24)
GFR calc Af Amer: 14 mL/min — ABNORMAL LOW (ref >60)
GFR calc non Af Amer: 12 mL/min — ABNORMAL LOW (ref >60)
Glucose, Bld: 126 mg/dL — ABNORMAL HIGH (ref 70–99)
Phosphorus: 3.1 mg/dL (ref 2.5–4.6)
Potassium: 3.3 mmol/L — ABNORMAL LOW (ref 3.5–5.1)
Sodium: 129 mmol/L — ABNORMAL LOW (ref 135–145)

## 2019-09-07 LAB — IRON AND TIBC
Iron: 14 ug/dL — ABNORMAL LOW (ref 45–182)
Saturation Ratios: 10 % — ABNORMAL LOW (ref 17.9–39.5)
TIBC: 134 ug/dL — ABNORMAL LOW (ref 250–450)
UIBC: 120 ug/dL

## 2019-09-07 LAB — FERRITIN: Ferritin: 157 ng/mL (ref 24–336)

## 2019-09-07 MED ORDER — DARBEPOETIN ALFA 60 MCG/0.3ML IJ SOSY
PREFILLED_SYRINGE | INTRAMUSCULAR | Status: AC
  Filled 2019-09-07: qty 0.3

## 2019-09-07 MED ORDER — HEPARIN SODIUM (PORCINE) 1000 UNIT/ML IJ SOLN
INTRAMUSCULAR | Status: AC
  Administered 2019-09-07: 4000 [IU]
  Filled 2019-09-07: qty 4

## 2019-09-07 MED ORDER — METOPROLOL TARTRATE 25 MG PO TABS
25.0000 mg | ORAL_TABLET | Freq: Two times a day (BID) | ORAL | Status: DC
  Administered 2019-09-07 – 2019-09-11 (×8): 25 mg via ORAL
  Filled 2019-09-07 (×9): qty 1

## 2019-09-07 MED ORDER — TRACE MINERALS CU-MN-SE-ZN 300-55-60-3000 MCG/ML IV SOLN
INTRAVENOUS | Status: AC
  Filled 2019-09-07: qty 448

## 2019-09-07 NOTE — Progress Notes (Signed)
PT Cancellation Note  Patient Details Name: Aaron [name] MRN: [id number] DOB: [birth year]   Cancelled Treatment:    Reason Eval/Treat Not Completed: Patient at procedure or test/unavailable(Leaving for HD on arrival. )   Sarath Privott F Ciana Simmon 09/07/2019, 11:34 AM Rashied Corallo W,PT Acute Rehabilitation Services Pager:  [phone]  Office:  [phone]

## 2019-09-07 NOTE — Progress Notes (Signed)
PROGRESS NOTE  Aaron [name]:[id number] DOB: 1955/12/05   PCP: Gwenlyn Saran Layton  Patient is from: Home.  DOA: 08/22/2019 LOS: 47  Brief Narrative / Interim history: 64 year old male with history of A. fib on Eliquis, ESRD on PD, COPD, former smoker, HTN and HLD presented to [facility], ED with progressive shortness of breath.  In ED, found to be in A. fib with RVR and started on Cardizem drip.  Transferred to [facility] for further care given ESRD on PD.  On 5/13, developed intermittent biliary emesis.  CT abdomen and pelvis revealed SBO.  General surgery consulted 5/14.  He underwent ex lap on 5/17.  He was noted to have drainage from abdominal wound incision.  He underwent repeat ex lap, washout and placement of retention sutures on 5/24.  Patient had a PICC line and started TPN on 5/18.  Patient started on HD through right IJ HD cath on 5/18.  Left IJ TDC on 5/19 due right IJ HD cath malfunction. He is now on HD MWF.   Surgery, nephrology and palliative care following.  Subjective: Seen and examined earlier this morning.  No major events overnight of this morning.  Reports about 5/10 abdominal and back pain.  Reports passing gas.  Reports small BM yesterday evening.  Denies chest pain, dyspnea, nausea or emesis.  Objective: Vitals:   09/07/19 1145 09/07/19 1215 09/07/19 1245 09/07/19 1315  BP: (!) 97/53 (!) 82/55 (!) 88/64 (!) 116/52  Pulse: 88 81 92 94  Resp: 15 15 13    Temp:      TempSrc:      SpO2:      Weight:      Height:        Intake/Output Summary (Last 24 hours) at 09/07/2019 1407 Last data filed at 09/07/2019 1002 Gross per 24 hour  Intake 1762.12 ml  Output 475 ml  Net 1287.12 ml   Filed Weights   09/06/19 0438 09/07/19 0427 09/07/19 1100  Weight: 82.1 kg 84.3 kg 84.3 kg    Examination:  GENERAL: No apparent distress.  Nontoxic. HEENT: MMM.  Vision and hearing grossly intact.  NECK: Supple.  No apparent JVD.  RESP:  No IWOB.  Fair  aeration bilaterally. CVS:  RRR. Heart sounds normal.  TDC over left chest ABD/GI/GU: BS+. Abd soft.  Mild tenderness.  Dressing and abdominal binder in place MSK/EXT:  Moves extremities. No apparent deformity. No edema.  SKIN: no apparent skin lesion or wound NEURO: Awake, alert and oriented appropriately.  No apparent focal neuro deficit. PSYCH: Calm. Normal affect.  Procedures:  -5/17-ex lap -5/24-ex lap  Microbiology summarized: 5/10-COVID-19 PCR negative 5/10-blood cultures negative  Assessment & Plan: Small bowel obstruction secondary to adhesion -Ex lap on 5/17 and 5/24. -On TPN and clear liquid diet -On morphine and oxycodone as needed for pain -On IV Ancef for cellulitis wound? -General surgery following.  History of ESRD on peritoneal dialysis -Converted to HD MWF through DC for 1 to 2 months after surgery (5/17) -Nephrology managing  COPD with acute exacerbation: Improved -Continue bronchodilators and mucolytic's  Chronic A. fib with RVR: RVR resolved. -Switch to p.o. metoprolol -Continue Eliquis for anticoagulation  ESRD  on peritoneal dialysis: Follow by Nephrology.  He will need to convert to hemodialysis through DC for 1 or 2 months after surgery 5/17  Anemia of renal disease: Relatively stable. -Per nephrology  Essential hypertension -Cardiac meds as above.  Hypokalemia/Hypomagnesemia  -Monitor and replenish with TPN  Hyponatremia: In the  setting of ESRD.  Leukocytosis/bandemia: demargination versus infection -Continue Ancef as above -Continue monitoring  Moderate malnutrition Nutrition Problem: Moderate Malnutrition Etiology: chronic illness(COPD, ESRD on PD) Signs/Symptoms: energy intake < 75% for > or equal to 1 month, mild fat depletion, moderate fat depletion, mild muscle depletion, moderate muscle depletion Interventions: TPN, Boost Breeze   DVT prophylaxis: On Eliquis for A. fib Code Status: DNR/DNI Family Communication: Patient  and/or RN. Available if any question.  Status is: Inpatient  Remains inpatient appropriate because:IV treatments appropriate due to intensity of illness or inability to take PO and Inpatient level of care appropriate due to severity of illness   Dispo: The patient is from: Home              Anticipated d/c is to: Home              Anticipated d/c date is: > 3 days              Patient currently is not medically stable to d/c.        Consultants:  General surgery Nephrology PMT   Sch Meds:  Scheduled Meds: . apixaban  5 mg Oral BID  . atorvastatin  80 mg Oral Daily  . budesonide (PULMICORT) nebulizer solution  0.25 mg Nebulization BID  . calcium acetate  1,334 mg Oral TID WC  . Chlorhexidine Gluconate Cloth  6 each Topical Daily  . darbepoetin (ARANESP) injection - DIALYSIS  60 mcg Intravenous Q Wed-HD  . feeding supplement  1 Container Oral BID BM  . gabapentin  300 mg Oral QHS  . gentamicin cream  1 application Topical Daily  . guaiFENesin-dextromethorphan  5 mL Oral Q8H  . ipratropium-albuterol  3 mL Nebulization BID  . metoprolol tartrate  25 mg Oral BID  . multivitamin  1 tablet Oral QHS  . pantoprazole (PROTONIX) IV  40 mg Intravenous Q12H  . sodium chloride flush  10-40 mL Intracatheter Q12H   Continuous Infusions: .  ceFAZolin (ANCEF) IV 1 g (09/06/19 1744)  . TPN ADULT (ION) 75 mL/hr at 09/06/19 1731  . TPN ADULT (ION)     PRN Meds:.acetaminophen **OR** acetaminophen, albuterol, fluticasone furoate-vilanterol, morphine injection, ondansetron **OR** ondansetron (ZOFRAN) IV, oxyCODONE-acetaminophen, sodium chloride flush, zolpidem  Antimicrobials: Anti-infectives (From admission, onward)   Start     Dose/Rate Route Frequency Ordered Stop   09/05/19 1800  ceFAZolin (ANCEF) IVPB 1 g/50 mL premix     1 g 100 mL/hr over 30 Minutes Intravenous Every 24 hours 09/04/19 0939     09/04/19 1030  ceFAZolin (ANCEF) IVPB 1 g/50 mL premix     1 g 100 mL/hr over 30  Minutes Intravenous  Once 09/04/19 0939 09/04/19 1629   08/31/19 1415  ceFAZolin (ANCEF) IVPB 2g/100 mL premix     2 g 200 mL/hr over 30 Minutes Intravenous  Once 08/31/19 1328 08/31/19 1358   08/31/19 1400  ceFAZolin (ANCEF) powder 2 g  Status:  Discontinued     2 g Other To Surgery 08/31/19 1314 08/31/19 1326   08/29/19 1145  ceFAZolin (ANCEF) IVPB 2g/100 mL premix     2 g 200 mL/hr over 30 Minutes Intravenous To ShortStay Surgical 08/29/19 1127 08/29/19 1445   08/23/19 2200  azithromycin (ZITHROMAX) tablet 500 mg  Status:  Discontinued    Note to Pharmacy: Pharmacy to adjust timing for next dose.   500 mg Oral Daily at bedtime 08/23/19 0233 08/25/19 2046   08/22/19 2130  azithromycin (ZITHROMAX) 500 mg  in sodium chloride 0.9 % 250 mL IVPB     500 mg 250 mL/hr over 60 Minutes Intravenous  Once 08/22/19 2122 08/22/19 2245       I have personally reviewed the following labs and images: CBC: Recent Labs  Lab 09/03/19 0529 09/04/19 0518 09/05/19 0511 09/06/19 0523 09/07/19 0322  WBC 12.3* 15.7* 22.2* 10.3 11.4*  NEUTROABS  --   --  20.1*  --   --   HGB 12.1* 11.7* 11.1* 9.3* 9.7*  HCT 37.4* 36.0* 34.0* 29.0* 30.7*  MCV 99.5 99.7 100.3* 100.3* 101.7*  PLT 211 204 219 180 211   BMP &GFR Recent Labs  Lab 09/01/19 0625 09/03/19 0529 09/05/19 0511 09/06/19 0523 09/07/19 0322  NA 135 136 129* 133* 129*  K 3.6 4.6 5.1 3.3* 3.3*  CL 99 99 93* 100 97*  CO2 25 24 20* 25 22  GLUCOSE 84 80 95 141* 126*  BUN 65* 68* 93* 38* 54*  CREATININE 5.98* 5.45* 7.10* 4.07* 4.80*  CALCIUM 7.6* 8.3* 7.9* 7.0* 7.1*  MG 1.8  --  1.7 1.6*  --   PHOS 3.3  --  4.6 3.1 3.1   Estimated Creatinine Clearance: 18.8 mL/min (A) (by C-G formula based on SCr of 4.8 mg/dL (H)). Liver & Pancreas: Recent Labs  Lab 09/01/19 0625 09/05/19 0511 09/06/19 0523 09/07/19 0322  AST 13* 14* 16  --   ALT <5 6 <5  --   ALKPHOS 59 78 71  --   BILITOT 0.4 0.7 0.4  --   PROT 5.1* 5.3* 4.8*  --   ALBUMIN  1.9* 1.8* 1.7* 1.6*   No results for input(s): LIPASE, AMYLASE in the last 168 hours. No results for input(s): AMMONIA in the last 168 hours. Diabetic: No results for input(s): HGBA1C in the last 72 hours. Recent Labs  Lab 09/06/19 2014 09/07/19 0018 09/07/19 0422 09/07/19 0631 09/07/19 0728  GLUCAP 146* 116* 125* 129* 123*   Cardiac Enzymes: No results for input(s): CKTOTAL, CKMB, CKMBINDEX, TROPONINI in the last 168 hours. No results for input(s): PROBNP in the last 8760 hours. Coagulation Profile: No results for input(s): INR, PROTIME in the last 168 hours. Thyroid Function Tests: No results for input(s): TSH, T4TOTAL, FREET4, T3FREE, THYROIDAB in the last 72 hours. Lipid Profile: Recent Labs    09/05/19 0511  TRIG 96   Anemia Panel: Recent Labs    09/07/19 0322  FERRITIN 157  TIBC 134*  IRON 14*   Urine analysis:    Component Value Date/Time   COLORURINE YELLOW 05/27/2019 0524   APPEARANCEUR CLEAR 05/27/2019 0524   LABSPEC 1.025 05/27/2019 0524   PHURINE 6.0 05/27/2019 0524   GLUCOSEU 150 (A) 05/27/2019 0524   HGBUR MODERATE (A) 05/27/2019 0524   BILIRUBINUR NEGATIVE 05/27/2019 0524   KETONESUR NEGATIVE 05/27/2019 0524   PROTEINUR 100 (A) 05/27/2019 0524   UROBILINOGEN 0.2 03/03/2010 1056   NITRITE NEGATIVE 05/27/2019 0524   LEUKOCYTESUR TRACE (A) 05/27/2019 0524   Sepsis Labs: Invalid input(s): PROCALCITONIN, [place]  Microbiology: No results found for this or any previous visit (from the past 240 hour(s)).  Radiology Studies: No results found.  45 minutes with more than 50% spent in reviewing records, counseling patient/family and coordinating care.   Amsi Grimley T. [place]az  If 7PM-7AM, please contact night-coverage [email or web address] Password [facility] 09/07/2019, 2:07 PM

## 2019-09-07 NOTE — Progress Notes (Signed)
PHARMACY - TOTAL PARENTERAL NUTRITION CONSULT NOTE   Indication: Recurrent Small bowel obstruction, post-op ileus  Patient Measurements: Height: 6\' 4"  (193 cm) Weight: 84.3 kg (185 lb 13.6 oz) IBW/kg (Calculated) : 86.8 TPN AdjBW (KG): 81.5 Body mass index is 22.62 kg/m. Usual Weight: 180 lb  Assessment:  64 yo M admitted for COPD exacerbation and afib who developed SBO on 08/25/19; however, this resolved and the patient had a BM on 5/21. The patient tolerated po intake well 5/20-5/21, and TPN was stopped on 5/21. The patient has had increased belching, minimal flatus, and concern for recurrent pSBO based on 5/22 Abd CT per surgery. Pharmacy has been consulted to resume TPN.  5/26 - patient regaining bowel function, may advance diet as tolerated and wean TPN to 1/2 tonight per General Surgery  Glucose / Insulin: No hx DM. A1c 5.9, CBGs controlled. Hypoglycemic episodes 5/23 off TPN. No SSI usage in last 24 hrs Electrolytes: Na 129, K 3.3 (Nephrology planning 3K dialysate today, s/p K runs x 3 yesterday; goal >/=4 with ileus/afib), CL 97, Mag 1.6 but repeat not drawn today (s/p Mag sulfate 2g IV x 1 yesterday; goal >/=2 with ileus/afib); others WNL Renal: ESRD on PD, switch to HD per Gen Surg due to suboptimal dialysis on PD and abdominal surgery. Last HD 5/24 (has MWF outpatient spot) LFTs / TGs: LFTs / Tbili / TG WNL Prealbumin / albumin: Prealbumin 12.5, Albumin 1.6 Intake / Output; MIVF: LBM 5/26 per notes (not charted), increased flatus, decreased belching GI Imaging: Per surgery: suggestion of pSBO on CT A/P 5/22 Surgeries / Procedures:  5/17 ex lap - lysis of adhesions, small bowel serosal repair x3, resection of mesenteric nodule 5/24 ex-lap, washout, placement of retention sutures due to increased drainage and risk of evisceration  Central access: PICC line 08/29/19 TPN start date: 5/17 - 5/20; 5/24 >>  Nutritional Goals (RD recommendation 5/25): Kcal: 0017-4944; Protein:  125-140 grams; Fluid: minimize for ESRD  Goal TPN rate (concentrated) is 75 mL/hr (provides 126g of protein, 360g dextrose, 72g lipids and 2448 kcals per day)  Current Nutrition:  Advance to FLD 5/26 - 100% meals charted Boost Breeze feeding supplement BID TPN  Plan:  Wean concentrated TPN to 1/2 goal rate 40 mL/hr at 1800 per discussion with General Surgery. Patient regaining bowel function and advancing diet as tolerated. Electrolytes in TPN: 53mEq/L of Na, Mag 3 meq/L (lytes will decrease with rate decrease), remove K/Ca/Phos for now in ESRD on HD. Cl:Ac ratio 1:2 Continue PO Renal MVI + add trace elements to TPN. Remove chromium in ESRD requiring RRT D/c very sensitive q6h SSI, as patient has not required any insulin Monitor TPN labs F/u Surgery plans, toleration of PO and ability to wean TPN to off  Nephrology adjusted dialysate for low K   Arturo Morton, PharmD, BCPS Please check AMION for all [place] contact numbers Clinical Pharmacist 09/07/2019 10:03 AM

## 2019-09-07 NOTE — Progress Notes (Signed)
[facility] Progress Note  2 Days Post-Op  Subjective: Abdominal pain improving. Reports a small, loose, non-bloody BM overnight along with increased flatus and decreased belching. Plans to mobilize 3x today and sit up in the chair.   AFVSS Objective: Vital signs in last 24 hours: Temp:  [97.9 F (36.6 C)-98.5 F (36.9 C)] 98.1 F (36.7 C) (05/26 0425) Pulse Rate:  [80-93] 87 (05/26 0425) Resp:  [16-18] 18 (05/26 0425) BP: (105-146)/(81-104) 146/88 (05/26 0425) SpO2:  [95 %-100 %] 96 % (05/26 0425) Weight:  [84.3 kg] 84.3 kg (05/26 0427) Last BM Date: 09/03/19  Intake/Output from previous day: 05/25 0701 - 05/26 0700 In: 1982.1 [P.O.:920; I.V.:1010.9; IV Piggyback:51.2] Out: 225 [Urine:225] Intake/Output this shift: No intake/output data recorded.  PE: General: chronically ill appearing white male who is sitting in bed in NAD Heart: regular, rate, and rhythm. No obvious murmurs, gallops, or rubs noted.  Palpable radial and pedal pulses bilaterally Lungs: CTAB.  Respiratory effort nonlabored Abd: soft, protuberant, appropriately tender, midline incision closed with staples and 3 retention sutures that remain in tact. There is minimal SS drainage on dressing - no purulence. There is mild erythema surrounding staples - monitor.     Lab Results:  Recent Labs    09/06/19 0523 09/07/19 0322  WBC 10.3 11.4*  HGB 9.3* 9.7*  HCT 29.0* 30.7*  PLT 180 211   BMET Recent Labs    09/06/19 0523 09/07/19 0322  NA 133* 129*  K 3.3* 3.3*  CL 100 97*  CO2 25 22  GLUCOSE 141* 126*  BUN 38* 54*  CREATININE 4.07* 4.80*  CALCIUM 7.0* 7.1*   CMP     Component Value Date/Time   NA 129 (L) 09/07/2019 0322   K 3.3 (L) 09/07/2019 0322   CL 97 (L) 09/07/2019 0322   CO2 22 09/07/2019 0322   GLUCOSE 126 (H) 09/07/2019 0322   BUN 54 (H) 09/07/2019 0322   CREATININE 4.80 (H) 09/07/2019 0322   CALCIUM 7.1 (L) 09/07/2019 0322   PROT 4.8 (L) 09/06/2019 0523   ALBUMIN  1.6 (L) 09/07/2019 0322   AST 16 09/06/2019 0523   ALT <5 09/06/2019 0523   ALKPHOS 71 09/06/2019 0523   BILITOT 0.4 09/06/2019 0523   GFRNONAA 12 (L) 09/07/2019 0322   GFRAA 14 (L) 09/07/2019 0322   Lipase     Component Value Date/Time   LIPASE 23 08/25/2019 1740       Studies/Results: No results found.  Anti-infectives: Anti-infectives (From admission, onward)   Start     Dose/Rate Route Frequency Ordered Stop   09/05/19 1800  ceFAZolin (ANCEF) IVPB 1 g/50 mL premix     1 g 100 mL/hr over 30 Minutes Intravenous Every 24 hours 09/04/19 0939     09/04/19 1030  ceFAZolin (ANCEF) IVPB 1 g/50 mL premix     1 g 100 mL/hr over 30 Minutes Intravenous  Once 09/04/19 0939 09/04/19 1629   08/31/19 1415  ceFAZolin (ANCEF) IVPB 2g/100 mL premix     2 g 200 mL/hr over 30 Minutes Intravenous  Once 08/31/19 1328 08/31/19 1358   08/31/19 1400  ceFAZolin (ANCEF) powder 2 g  Status:  Discontinued     2 g Other To Surgery 08/31/19 1314 08/31/19 1326   08/29/19 1145  ceFAZolin (ANCEF) IVPB 2g/100 mL premix     2 g 200 mL/hr over 30 Minutes Intravenous To ShortStay Surgical 08/29/19 1127 08/29/19 1445   08/23/19 2200  azithromycin (ZITHROMAX) tablet 500 mg  Status:  Discontinued    Note to Pharmacy: Pharmacy to adjust timing for next dose.   500 mg Oral Daily at bedtime 08/23/19 0233 08/25/19 2046   08/22/19 2130  azithromycin (ZITHROMAX) 500 mg in sodium chloride 0.9 % 250 mL IVPB     500 mg 250 mL/hr over 60 Minutes Intravenous  Once 08/22/19 2122 08/22/19 2245       Assessment/Plan CHF COPD A fib  Polycystic kidney disease, ESRD, on PD -  converted to HD temporarily H/O MI - plavix on hold as of 5/13 CAD Hep C HLD  SBO POD#9 exploratory laparotomy, LOA, repair of SB serosal injury x3, resection of mesenteric nodule 08/29/19 Dr. Bobbye Morton POD#2 exploratory laparotomy, washout, abdominal wall closure with placement of retention sutures for fascial dehiscence  - AFVSS, WBC 11  from 10 - increased flatus and one BM, allow FLD and advance to renal diet as tolerated  - continue abdominal binder  - OOB/mobilize, PT/OT - IS 10x q 1h   FEN - CLD, carb steady feeding supplement BID, ok to 1/2 TPN support tonight  VTE - SCDs, Eliquis ID -Ancef for cellulitis 5/23 >>    LOS: 16 days     Jill Alexanders , [facility] 09/07/2019, 7:37 AM Please see Amion for pager number during day hours 7:00am-4:30pm   LOS: 16 days    Jill Alexanders 09/07/2019

## 2019-09-07 NOTE — TOC Progression Note (Signed)
Transition of Care [facility]) - Progression Note    Patient Details  Name: Aaron [name] MRN: [id number] Date of Birth: 04/29/1955  [facility]) CM/SW Contact  Zenon Mayo, RN Phone Number: 09/07/2019, 4:53 PM  Clinical Narrative:    Patient now with ileus, TPN resumed til tomorrow am, advance diet from clears to full , s/p repeat exploratory lap, washout and placement of retention sutures.  TOC will cont to follow for dc needs.   Expected Discharge Plan: [place] Barriers to Discharge: Continued Medical Work up  Expected Discharge Plan and Services Expected Discharge Plan: [place]   Discharge Planning Services: CM Consult Post Acute Care Choice: [place] arrangements for the past 2 months: Single Family Home                   DME Agency: NA       HH Arranged: RN, PT, OT, Nurse's Aide, Social Work [employer] Agency: [facility] [employer] [facility] [employer] Date [place]: 09/01/19 Time [place]: 1700 Representative spoke with at [facility]: [place] ([place]) Interventions    Readmission Risk Interventions Readmission Risk Prevention Plan 09/01/2019 12/08/2017  Transportation Screening Complete Complete  PCP or Specialist Appt within 3-5 Days Complete -  HRI or [place] Complete -  Social Work Consult for [place] Planning/Counseling Complete -  Palliative Care Screening Complete -  Medication Review Press photographer) Complete Complete  HRI or Home Care Consult - Complete  Some recent data might be hidden

## 2019-09-07 NOTE — Progress Notes (Signed)
[facility] Progress Note     Assessment/ Plan:   1. SOB/COPD/pulmonary edema-Felt to be multifactorial - COPD/ Afib with rapid rate and volume overload. Converted to HD for ultrafiltration.  S/p Steroids/inhalers/azithro.  Improved.  2. ESRD-secondary to polycystic kidney disease. Has been on PD for 6 mos. followed in [place]. Now converted to HD in light of major abd surgery but PD catheter remains in place. Appreciate the LIJ TC 5/19 by VIR.  HD today.  Has spot at [facility] 2nd shift (MWF regimen) 1145AM. --> UF 2L as tolerated today.  Not clear how feasible transition back to PD will be but he would really prefer that if possible - will need to address with surgeon at outpt f/u.  Can clarify if PD catheter can be flushed weekly per usual.   3. Anemia- Hb 9s, started low dose ESA.  Iron sat low at 10%, will give IV iron after antibiotic course complete given cellulitis seemed quite significant.  4. Secondary hyperparathyroidism - calc OK- Phos4.6. We will continue to monitor; on phoslo 5. HTN/volume-continue carvedilol and diltiazem for atrial fibrillation.  6. Leukocytosis:much improved from 22 to 10 -11 after abd washout + ancef for cellulitis.  PD fluid had been negative for infection earlier in hospitalization 7. Partial small bowel obstruction: Better now, tolerating PO. Surgery following and appreciate assistance; dense adhesions on 5/17. Back to OR earlier this wee for washout and retention sutures - seems to be ok for now.  8. Hyperdense kidney cyst: Consider further evaluation with MRI in the future. 9.  Hypokalemia:  Replete today, 3K dialysate tomorrow.   [place] for d/c from renal perspective.    Subjective:   Bowel function returning and diet being advanced.   He has no complaints except difficulty sitting during HD process due to back pain.     Objective:   BP (!) 146/88   Pulse 87   Temp 98.1 F (36.7 C) (Oral)   Resp 18   Ht 6'  4" (1.93 m)   Wt 84.3 kg   SpO2 94%   BMI 22.62 kg/m   Intake/Output Summary (Last 24 hours) at 09/07/2019 1021 Last data filed at 09/07/2019 1002 Gross per 24 hour  Intake 2002.12 ml  Output 475 ml  Net 1527.12 ml   Weight change: 2.2 kg  Physical Exam: General:supine inbed on HD  Heart:RRR Lungs:Coarse bilateral breath sounds, no increased work of breathing Abdomen:Distented with abdominal binder in place Extremities: Trace edema Dialysis Access: PD cath,LIJ TC 5/19 Access: Lt arm PICC  Imaging: No results found.  Labs: BMET Recent Labs  Lab 09/01/19 0625 09/03/19 0529 09/05/19 0511 09/06/19 0523 09/07/19 0322  NA 135 136 129* 133* 129*  K 3.6 4.6 5.1 3.3* 3.3*  CL 99 99 93* 100 97*  CO2 25 24 20* 25 22  GLUCOSE 84 80 95 141* 126*  BUN 65* 68* 93* 38* 54*  CREATININE 5.98* 5.45* 7.10* 4.07* 4.80*  CALCIUM 7.6* 8.3* 7.9* 7.0* 7.1*  PHOS 3.3  --  4.6 3.1 3.1   CBC Recent Labs  Lab 09/04/19 0518 09/05/19 0511 09/06/19 0523 09/07/19 0322  WBC 15.7* 22.2* 10.3 11.4*  NEUTROABS  --  20.1*  --   --   HGB 11.7* 11.1* 9.3* 9.7*  HCT 36.0* 34.0* 29.0* 30.7*  MCV 99.7 100.3* 100.3* 101.7*  PLT 204 219 180 211    Medications:    . apixaban  5 mg Oral BID  . atorvastatin  80 mg  Oral Daily  . budesonide (PULMICORT) nebulizer solution  0.25 mg Nebulization BID  . calcium acetate  1,334 mg Oral TID WC  . Chlorhexidine Gluconate Cloth  6 each Topical Daily  . darbepoetin (ARANESP) injection - DIALYSIS  60 mcg Intravenous Q Wed-HD  . feeding supplement  1 Container Oral BID BM  . gabapentin  300 mg Oral QHS  . gentamicin cream  1 application Topical Daily  . guaiFENesin-dextromethorphan  5 mL Oral Q8H  . insulin aspart  0-6 Units Subcutaneous Q6H  . ipratropium-albuterol  3 mL Nebulization BID  . metoprolol tartrate  5 mg Intravenous Q6H  . multivitamin  1 tablet Oral QHS  . pantoprazole (PROTONIX) IV  40 mg Intravenous Q12H  . sodium chloride flush   10-40 mL Intracatheter Q12H     Jannifer Hick MD [facility] Pager [phone]

## 2019-09-08 DIAGNOSIS — R5381 Other malaise: Secondary | ICD-10-CM

## 2019-09-08 DIAGNOSIS — I10 Essential (primary) hypertension: Secondary | ICD-10-CM

## 2019-09-08 LAB — COMPREHENSIVE METABOLIC PANEL
ALT: <5 U/L (ref 0–44)
AST: 19 U/L (ref 15–41)
Albumin: 1.5 g/dL — ABNORMAL LOW (ref 3.5–5.0)
Alkaline Phosphatase: 81 U/L (ref 38–126)
Anion gap: 8 (ref 5–15)
BUN: 34 mg/dL — ABNORMAL HIGH (ref 8–23)
CO2: 26 mmol/L (ref 22–32)
Calcium: 7.2 mg/dL — ABNORMAL LOW (ref 8.9–10.3)
Chloride: 97 mmol/L — ABNORMAL LOW (ref 98–111)
Creatinine, Ser: 3.48 mg/dL — ABNORMAL HIGH (ref 0.61–1.24)
GFR calc Af Amer: 20 mL/min — ABNORMAL LOW (ref >60)
GFR calc non Af Amer: 18 mL/min — ABNORMAL LOW (ref >60)
Glucose, Bld: 110 mg/dL — ABNORMAL HIGH (ref 70–99)
Potassium: 3.3 mmol/L — ABNORMAL LOW (ref 3.5–5.1)
Sodium: 131 mmol/L — ABNORMAL LOW (ref 135–145)
Total Bilirubin: 0.3 mg/dL (ref 0.3–1.2)
Total Protein: 4.7 g/dL — ABNORMAL LOW (ref 6.5–8.1)

## 2019-09-08 LAB — MAGNESIUM: Magnesium: 1.7 mg/dL (ref 1.7–2.4)

## 2019-09-08 LAB — GLUCOSE, CAPILLARY
Glucose-Capillary: 104 mg/dL — ABNORMAL HIGH (ref 70–99)
Glucose-Capillary: 109 mg/dL — ABNORMAL HIGH (ref 70–99)
Glucose-Capillary: 109 mg/dL — ABNORMAL HIGH (ref 70–99)
Glucose-Capillary: 123 mg/dL — ABNORMAL HIGH (ref 70–99)
Glucose-Capillary: 130 mg/dL — ABNORMAL HIGH (ref 70–99)
Glucose-Capillary: 92 mg/dL (ref 70–99)

## 2019-09-08 LAB — PHOSPHORUS: Phosphorus: 2.5 mg/dL (ref 2.5–4.6)

## 2019-09-08 MED ORDER — PANTOPRAZOLE SODIUM 40 MG PO TBEC
40.0000 mg | DELAYED_RELEASE_TABLET | Freq: Two times a day (BID) | ORAL | Status: DC
  Administered 2019-09-08 – 2019-09-11 (×7): 40 mg via ORAL
  Filled 2019-09-08 (×7): qty 1

## 2019-09-08 MED ORDER — TORSEMIDE 100 MG PO TABS
100.0000 mg | ORAL_TABLET | Freq: Once | ORAL | Status: AC
  Administered 2019-09-08: 100 mg via ORAL
  Filled 2019-09-08: qty 1

## 2019-09-08 MED ORDER — POTASSIUM CHLORIDE 20 MEQ PO PACK
20.0000 meq | PACK | Freq: Two times a day (BID) | ORAL | Status: DC
  Administered 2019-09-08 – 2019-09-11 (×7): 20 meq via ORAL
  Filled 2019-09-08 (×7): qty 1

## 2019-09-08 NOTE — Progress Notes (Addendum)
[facility] Progress Note     Assessment/ Plan:   1. SOB/COPD/pulmonary edema-Felt to be multifactorial - COPD/ Afib with rapid rate and volume overload. Converted to HD for ultrafiltration.  S/p Steroids/inhalers/azithro.  Improved   2. ESRD-secondary to polycystic kidney disease. Has been on PD for 6 mos. followed in [place]. Now converted to HD in light of major abd surgery but PD catheter remains in place. Appreciate the [facility] TC 5/19 by VIR.  HD yesterday - only tolerated 561mL UF and he's significantly net + due to TPN mainly.  I will give torsemide 100 mg x 1 today.  Hopefully TPN will be off soon.    Has spot at [facility] 2nd shift (MWF regimen) 1145AM.  Not clear how feasible transition back to PD will be but he would really prefer that if possible - will need to address with surgeon at outpt f/u.  I paged surgery to see if we can flush his PD catheter weekly as we usually do, I will put in addendum if I clarify.    3. Anemia- Hb 9s, started low dose ESA.  Iron sat low at 10%, will give IV iron after antibiotic course complete given cellulitis seemed quite significant.   4. Secondary hyperparathyroidism - calc OK- Phos4.6. We will continue to monitor; on phoslo 5. HTN/volume-continue carvedilol and diltiazem for atrial fibrillation.  6. Leukocytosis:much improved from 22 to 10 -11s after abd washout + ancef for cellulitis.  PD fluid had been negative for infection earlier in hospitalization 7. Partial small bowel obstruction: Better now, tolerating PO. Surgery following and appreciate assistance; dense adhesions on 5/17. Back to OR earlier this week for washout and retention sutures - seems to be ok for now.  Diet advancing. 8. Hyperdense kidney cyst: Consider further evaluation with MRI in the future. 9.  Hypokalemia:  Replete today, 4K dialysate tomorrow.   [place] for d/c from renal perspective.    Addendum: spoke with surgery PA - ok to flush  PD catheter per usual weekly process to maintain patency.  Per dialysis RN volume is 1L in and out.   Subjective:   Bowel function returning and diet being advanced.   He has no complaints today.  I/Os yesterday 2.4 / 1 (500 UF, 500 UOP) TPN running currently.     Objective:   BP 122/87 (BP Location: Right Arm)   Pulse 63   Temp 98.2 F (36.8 C) (Oral)   Resp 20   Ht 6\' 4"  (1.93 m)   Wt 85.6 kg   SpO2 97%   BMI 22.97 kg/m   Intake/Output Summary (Last 24 hours) at 09/08/2019 1416 Last data filed at 09/08/2019 0900 Gross per 24 hour  Intake 2468.63 ml  Output 450 ml  Net 2018.63 ml   Weight change: 0 kg  Physical Exam: General:supine inbed on HD  Heart:RRR Lungs:Coarse bilateral breath sounds, no increased work of breathing Abdomen:Distended with abdominal binder in place Extremities: Trace edema Dialysis Access: PD cath,[facility] TC 5/19 Access: Lt arm PICC  Imaging: No results found.  Labs: BMET Recent Labs  Lab 09/03/19 0529 09/05/19 0511 09/06/19 0523 09/07/19 0322 09/08/19 0500  NA 136 129* 133* 129* 131*  K 4.6 5.1 3.3* 3.3* 3.3*  CL 99 93* 100 97* 97*  CO2 24 20* 25 22 26   GLUCOSE 80 95 141* 126* 110*  BUN 68* 93* 38* 54* 34*  CREATININE 5.45* 7.10* 4.07* 4.80* 3.48*  CALCIUM 8.3* 7.9* 7.0* 7.1* 7.2*  PHOS  --  4.6 3.1 3.1 2.5   CBC Recent Labs  Lab 09/04/19 0518 09/05/19 0511 09/06/19 0523 09/07/19 0322  WBC 15.7* 22.2* 10.3 11.4*  NEUTROABS  --  20.1*  --   --   HGB 11.7* 11.1* 9.3* 9.7*  HCT 36.0* 34.0* 29.0* 30.7*  MCV 99.7 100.3* 100.3* 101.7*  PLT 204 219 180 211    Medications:    . apixaban  5 mg Oral BID  . atorvastatin  80 mg Oral Daily  . budesonide (PULMICORT) nebulizer solution  0.25 mg Nebulization BID  . calcium acetate  1,334 mg Oral TID WC  . Chlorhexidine Gluconate Cloth  6 each Topical Daily  . darbepoetin (ARANESP) injection - DIALYSIS  60 mcg Intravenous Q Wed-HD  . feeding supplement  1 Container Oral BID  BM  . gabapentin  300 mg Oral QHS  . gentamicin cream  1 application Topical Daily  . guaiFENesin-dextromethorphan  5 mL Oral Q8H  . ipratropium-albuterol  3 mL Nebulization BID  . metoprolol tartrate  25 mg Oral BID  . multivitamin  1 tablet Oral QHS  . pantoprazole  40 mg Oral BID  . potassium chloride  20 mEq Oral BID  . sodium chloride flush  10-40 mL Intracatheter Q12H     Jannifer Hick MD [facility] Pager [phone]

## 2019-09-08 NOTE — Progress Notes (Signed)
Nutrition Follow-up  DOCUMENTATION CODES:   Non-severe (moderate) malnutrition in context of chronic illness  INTERVENTION:   -D/c Boost Breeze po BID, each supplement provides 250 kcal and 9 grams of protein -Continue renal MVI daily -Magic cup TID with meals, each supplement provides 290 kcal and 9 grams of protein  NUTRITION DIAGNOSIS:   Moderate Malnutrition related to chronic illness(COPD, ESRD on PD) as evidenced by energy intake < 75% for > or equal to 1 month, mild fat depletion, moderate fat depletion, mild muscle depletion, moderate muscle depletion.  Ongoing  GOAL:   Patient will meet greater than or equal to 90% of their needs  Progressing   MONITOR:   PO intake, Supplement acceptance, Diet advancement, Labs, Weight trends, Skin, I & O's  REASON FOR ASSESSMENT:   Rounds, Other (Comment)(New TPN)    ASSESSMENT:   Aaron [name] is a 64 y.o. male with medical history significant of hypertension hyperlipidemia, atrial fibrillation on Eliquis, ESRD on peritoneal dialysis presented to ED for evaluation of worsening shortness of breath  5/13- CT reveals partial SBO 5/14- NGT placed 5/16- clamp NGT, advanced to clear liquid diet 5/17- NPO, s/pProcedure:exploratory laparotomy, lysis of adhesions, small bowel serosal repair x3, resection of mesenteric nodule, attempted central venous catheter placement; PICC placed; TPN initiated 5/18- s/p placement of temporary IJ trialysis catheter 5/19- NGT removed, advanced to clear liquid diet, s/p tunnelled HD cath placement, advanced to full liquid diet 5/20- advanced to soft diet  5/22- leakage from abdominal incision; CT of abdomen and pelvis revealed no large fascial defect  5/24- TPN restarted; s/p Exploratory laparotomy, closure abdominal wall, placement of retention sutures  Reviewed I/O's: +1.4 L x 24 hours and +6.6 L since 08/25/19  UOP: 500 ml x 24 hours  Pt sitting up in bed at time of visit, pleasant and in  good spirits at time of visit. He reports 'I'm ready to go home and see my granddaughter".   Pt reports his appetite has improved and is eating most of his food today. He shares he likes the supplement shakes (Boost [place] and Nepro), however, feels like the fluids impact his appetite. He would rather eat food than drink supplements. RD discussed importance of good meal intake, especially protein, to support post-operative healing.   Per pharmacy note, plan to d/c TPN today.   Medications reviewed and include phoslo.   Labs reviewed: Na: 131, K: 3.3, Mg and PHOS WDL. CBGS: 109-130.   Diet Order:   Diet Order            Diet renal/carb modified with fluid restriction Diet-HS Snack? Nothing; Fluid restriction: 1200 mL Fluid; Room service appropriate? Yes; Fluid consistency: Thin  Diet effective now              EDUCATION NEEDS:   Education needs have been addressed  Skin:  Skin Assessment: Skin Integrity Issues: Skin Integrity Issues:: Incisions Incisions: closed rt neck, abdomen  Last BM:  09/07/19  Height:   Ht Readings from Last 1 Encounters:  08/29/19 6\' 4"  (1.93 m)    Weight:   Wt Readings from Last 1 Encounters:  09/08/19 85.6 kg    Ideal Body Weight:  91.8 kg  BMI:  Body mass index is 22.97 kg/m.  Estimated Nutritional Needs:   Kcal:  1610-9604  Protein:  125-140 grams  Fluid:  1000 mg + UOP    Loistine Chance, RD, LDN, [place] Registered Dietitian II Certified Diabetes Care and Education Specialist Please refer to  AMION for RD and/or RD on-call/weekend/after hours pager

## 2019-09-08 NOTE — Progress Notes (Signed)
PHARMACY - TOTAL PARENTERAL NUTRITION CONSULT NOTE   Indication: Recurrent Small bowel obstruction, post-op ileus  Patient Measurements: Height: 6\' 4"  (193 cm) Weight: 85.6 kg (188 lb 11.2 oz) IBW/kg (Calculated) : 86.8 TPN AdjBW (KG): 81.5 Body mass index is 22.97 kg/m. Usual Weight: 180 lb  Assessment:  64 yo M admitted for COPD exacerbation and afib who developed SBO on 08/25/19; however, this resolved and the patient had a BM on 5/21. The patient tolerated po intake well 5/20-5/21, and TPN was stopped on 5/21. The patient has had increased belching, minimal flatus, and concern for recurrent pSBO based on 5/22 Abd CT per surgery. Pharmacy has been consulted to resume TPN.  5/26 - patient regaining bowel function, may advance diet as tolerated and wean TPN to 1/2 tonight per General Surgery  Glucose / Insulin: No hx DM. A1c 5.9, CBGs controlled. Hypoglycemic episodes 5/23 off TPN. SSI d/c'd 5/26 Electrolytes: Na 131, K 3.3 (goal >/=4 with ileus/afib), CL 97, Mag up to 1.7 (goal >/=2 with ileus/afib), Phos down to 2.5, others WNL Renal: ESRD on PD, switch to HD per Gen Surg due to suboptimal dialysis on PD and abdominal surgery. HD MWF - on schedule LFTs / TGs: LFTs / Tbili / TG WNL Prealbumin / albumin: Prealbumin 12.5, Albumin 1.5 Intake / Output; MIVF: LBM 5/25, increased flatus, decreased belching; minimal UOP GI Imaging: Per surgery: suggestion of pSBO on CT A/P 5/22 Surgeries / Procedures:  5/17 ex lap - lysis of adhesions, small bowel serosal repair x3, resection of mesenteric nodule 5/24 ex-lap, washout, placement of retention sutures due to increased drainage and risk of evisceration  Central access: PICC line 08/29/19 TPN start date: 5/17 - 5/20; 5/24 >>  Nutritional Goals (RD recommendation 5/25): Kcal: 1761-6073; Protein: 125-140 grams; Fluid: minimize for ESRD  Goal TPN rate (concentrated) is 75 mL/hr (provides 126g of protein, 360g dextrose, 72g lipids and 2448 kcals  per day)  Current Nutrition:  Advance to renal/carb modified diet 5/27 - 100% meals charted Boost Breeze feeding supplement BID - all doses charted TPN  Plan:  Ok to wean TPN to off today per General Surgery Discussed plan with RN - at 1600, wean TPN to 20 ml/hr x 2hrs, then d/c D/c TPN labs and nursing orders   Arturo Morton, PharmD, BCPS Please check AMION for all [facility] contact numbers Clinical Pharmacist 09/08/2019 7:54 AM

## 2019-09-08 NOTE — Progress Notes (Addendum)
3 Days Post-Op    CC: Abdominal pain  Subjective: Patient sitting up in bed, slightly uncomfortable.  Says his abdomen still hurts.  He has A PD Catheter, capped.  Midline incision shows retention sutures presebt.  There is still some midline drainage and a wick still in place.  Some mild erythema.,  Retention sutures are intact.  He is tolerating carb modified diet without issue.  Objective: Vital signs in last 24 hours: Temp:  [97.6 F (36.4 C)-98.9 F (37.2 C)] 97.9 F (36.6 C) (05/27 0738) Pulse Rate:  [52-109] 91 (05/27 0811) Resp:  [12-32] 18 (05/27 0811) BP: (82-143)/(52-98) 108/97 (05/27 0738) SpO2:  [95 %-100 %] 97 % (05/27 0814) FiO2 (%):  [28 %] 28 % (05/27 0814) Weight:  [84.2 kg-85.6 kg] 85.6 kg (05/27 0048) Last BM Date: 09/07/19  1320 PO 1100 IV 500 urine HD 500  Afebrile, VSS K+ 3.3 Creatinine 3.38  Intake/Output from previous day: 05/26 0701 - 05/27 0700 In: 2408.6 [P.O.:1320; I.V.:1038.6; IV Piggyback:50] Out: 1000 [Urine:500] Intake/Output this shift: Total I/O In: 300 [P.O.:300] Out: 200 [Urine:200]    Lab Results:  Recent Labs    09/06/19 0523 09/07/19 0322  WBC 10.3 11.4*  HGB 9.3* 9.7*  HCT 29.0* 30.7*  PLT 180 211    BMET Recent Labs    09/07/19 0322 09/08/19 0500  NA 129* 131*  K 3.3* 3.3*  CL 97* 97*  CO2 22 26  GLUCOSE 126* 110*  BUN 54* 34*  CREATININE 4.80* 3.48*  CALCIUM 7.1* 7.2*   PT/INR No results for input(s): LABPROT, INR in the last 72 hours.  Recent Labs  Lab 09/05/19 0511 09/06/19 0523 09/07/19 0322 09/08/19 0500  AST 14* 16  --  19  ALT 6 <5  --  <5  ALKPHOS 78 71  --  81  BILITOT 0.7 0.4  --  0.3  PROT 5.3* 4.8*  --  4.7*  ALBUMIN 1.8* 1.7* 1.6* 1.5*    Returned to make a change Lipase     Component Value Date/Time   LIPASE 23 08/25/2019 1740   Physical exam:. Well-nourished well-developed white male in no acute distress. Chest: Clear to auscultation anteriorly Abdomen: Midline incision  is intact with retention sutures.  There is some midline drainage, there is a wick in place with staples also in place.  Abdomen somewhat distended with positive bowel sounds, tolerating a carb modified diet.  He reports having soft BMs.   Medications: . apixaban  5 mg Oral BID  . atorvastatin  80 mg Oral Daily  . budesonide (PULMICORT) nebulizer solution  0.25 mg Nebulization BID  . calcium acetate  1,334 mg Oral TID WC  . Chlorhexidine Gluconate Cloth  6 each Topical Daily  . darbepoetin (ARANESP) injection - DIALYSIS  60 mcg Intravenous Q Wed-HD  . feeding supplement  1 Container Oral BID BM  . gabapentin  300 mg Oral QHS  . gentamicin cream  1 application Topical Daily  . guaiFENesin-dextromethorphan  5 mL Oral Q8H  . ipratropium-albuterol  3 mL Nebulization BID  . metoprolol tartrate  25 mg Oral BID  . multivitamin  1 tablet Oral QHS  . pantoprazole  40 mg Oral BID  . sodium chloride flush  10-40 mL Intracatheter Q12H    Assessment/Plan CHF COPD A fib  Polycystic kidney disease, ESRD, on PD -  converted to HD temporarily H/O MI - plavix on hold as of 5/13 CAD Hep C HLD  SBO POD#9 exploratory laparotomy, LOA,  repair of SB serosal injury x3, resection of mesenteric nodule 08/29/19 Dr. Bobbye Morton POD#2 exploratory laparotomy, washout, abdominal wall closure with placement of retention sutures for fascial dehiscence 09/02/19 Dr. Georgette Dover - AFVSS, WBC 11 from 10 - increased flatus and one BM, allow FLD and advance to renal diet as tolerated  - continue abdominal binder  - OOB/mobilize, PT/OT - IS 10x q 1h   FEN -  carb mod/renal diet/ TPN - OK to wean VTE - SCDs, Eliquis ID -Ancef for cellulitis 5/23 >> day 4  Plan: Continue current Rx.  We can discontinue the TPN.      LOS: 17 days    Aaron [name] 09/08/2019 Please see Amion

## 2019-09-08 NOTE — Progress Notes (Signed)
PT Cancellation Note  Patient Details Name: Aaron [name] MRN: [id number] DOB: 1955/07/01   Cancelled Treatment:    Reason Eval/Treat Not Completed: Pain limiting ability to participate;Fatigue/lethargy limiting ability to participate.  Pt is complaining of pain from his abdomen, and tired.  Talked with him about the length of time since PT had seen him, admits he has not been walking.  Has been up in chair, and asked him to be up walking with rehab tomorrrow as he has only been up in recliner lately.  Follow acutely for these needs.   Ramond Dial 09/08/2019, 3:23 PM   Mee Hives, PT MS Acute Rehab Dept. Number: [facility]

## 2019-09-08 NOTE — Progress Notes (Signed)
PROGRESS NOTE  CHEVEZ SAMBRANO FIE:[id number] DOB: [birth year]   PCP: Gwenlyn Saran [place]  Patient is from: Home.  DOA: 08/22/2019 LOS: 3  Brief Narrative / Interim history: 64 year old male with history of A. fib on Eliquis, ESRD on PD, COPD, former smoker, HTN and HLD presented to [facility], ED with progressive shortness of breath.  In ED, found to be in A. fib with RVR and started on Cardizem drip.  Transferred to [facility] for further care given ESRD on PD.  On 5/13, developed intermittent biliary emesis.  CT abdomen and pelvis revealed SBO.  General surgery consulted 5/14.  He underwent ex lap on 5/17.  He was noted to have drainage from abdominal wound incision.  He underwent repeat ex lap, washout and placement of retention sutures on 5/24.  Patient had a PICC line and started TPN on 5/18.   Patient started on HD through right IJ HD cath on 5/18.  Left IJ TDC on 5/19 due right IJ HD cath malfunction. He is now on HD MWF.   Surgery, nephrology and palliative care following.  Subjective: No major events overnight or this morning.  No complaints other than the usual abdominal pain that she rates 6/10.  Tolerated soft diet this morning.  Passing gas.  LBM 2 days ago.  He denies chest pain.  He reports orthopnea.  Objective: Vitals:   09/08/19 0738 09/08/19 0811 09/08/19 0814 09/08/19 1051  BP: (!) 108/97   122/87  Pulse: (!) 52 91  63  Resp: 20 18  20   Temp: 97.9 F (36.6 C)   98.2 F (36.8 C)  TempSrc: Oral   Oral  SpO2: 99% 97% 97% 97%  Weight:      Height:        Intake/Output Summary (Last 24 hours) at 09/08/2019 1127 Last data filed at 09/08/2019 0900 Gross per 24 hour  Intake 2468.63 ml  Output 950 ml  Net 1518.63 ml   Filed Weights   09/07/19 1100 09/07/19 1400 09/08/19 0048  Weight: 84.3 kg 84.2 kg 85.6 kg    Examination:  GENERAL: No apparent distress.  Nontoxic. HEENT: MMM.  Vision and hearing grossly intact.  NECK: Supple.  No apparent  JVD.  RESP: 95% on 2 L.  No IWOB.  Fair aeration bilaterally. CVS:  RRR. Heart sounds normal.  ABD/GI/GU: BS+. Abd soft.  Mild tenderness.  Dressing and abdominal binder in place. MSK/EXT:  Moves extremities. No apparent deformity. No edema.  SKIN: no apparent skin lesion or wound NEURO: Awake, alert and oriented appropriately.  No apparent focal neuro deficit. PSYCH: Calm. Normal affect.   Procedures:  -5/17-ex lap -5/24-ex lap  Microbiology summarized: 5/10-COVID-19 PCR negative 5/10-blood cultures negative  Assessment & Plan: Small bowel obstruction secondary to adhesion -Ex lap on 5/17 and 5/24. -On TPN and carb modified diet.  We may able to move to TPN at night only -On morphine and oxycodone as needed for pain -On IV Ancef for cellulitis wound? -General surgery following.  History of ESRD on peritoneal dialysis -Converted to HD MWF through [facility] for 1 to 2 months after surgery (5/17) -Also making some urine.  About 500 cc / 24 hours. -Nephrology managing  COPD with acute exacerbation: Improved -Continue bronchodilators and mucolytic's  Chronic A. fib with RVR: RVR resolved. -Switched to p.o. metoprolol -Continue Eliquis for anticoagulation  Anemia of renal disease: Relatively stable. -Per nephrology  Essential hypertension -Cardiac meds as above.  Hypokalemia/Hypomagnesemia  -Monitor and replenish with TPN  Hyponatremia: In the setting of ESRD.  Leukocytosis/bandemia: demargination versus infection.  Improved. -Continue Ancef as above -Continue monitoring  Moderate malnutrition Nutrition Problem: Moderate Malnutrition Etiology: chronic illness(COPD, ESRD on PD) Signs/Symptoms: energy intake < 75% for > or equal to 1 month, mild fat depletion, moderate fat depletion, mild muscle depletion, moderate muscle depletion Interventions: TPN, Boost Breeze   DVT prophylaxis: On Eliquis for A. fib Code Status: DNR/DNI Family Communication: Patient and/or RN.  Available if any question.  Status is: Inpatient  Remains inpatient appropriate because:IV treatments appropriate due to intensity of illness or inability to take PO and Inpatient level of care appropriate due to severity of illness   Dispo: The patient is from: Home              Anticipated d/c is to: Home              Anticipated d/c date is: > 3 days              Patient currently is not medically stable to d/c.        Consultants:  General surgery Nephrology PMT   Sch Meds:  Scheduled Meds: . apixaban  5 mg Oral BID  . atorvastatin  80 mg Oral Daily  . budesonide (PULMICORT) nebulizer solution  0.25 mg Nebulization BID  . calcium acetate  1,334 mg Oral TID WC  . Chlorhexidine Gluconate Cloth  6 each Topical Daily  . darbepoetin (ARANESP) injection - DIALYSIS  60 mcg Intravenous Q Wed-HD  . feeding supplement  1 Container Oral BID BM  . gabapentin  300 mg Oral QHS  . gentamicin cream  1 application Topical Daily  . guaiFENesin-dextromethorphan  5 mL Oral Q8H  . ipratropium-albuterol  3 mL Nebulization BID  . metoprolol tartrate  25 mg Oral BID  . multivitamin  1 tablet Oral QHS  . pantoprazole  40 mg Oral BID  . potassium chloride  20 mEq Oral BID  . sodium chloride flush  10-40 mL Intracatheter Q12H   Continuous Infusions: .  ceFAZolin (ANCEF) IV Stopped (09/07/19 1812)  . TPN ADULT (ION) 40 mL/hr at 09/07/19 1836   PRN Meds:.acetaminophen **OR** acetaminophen, albuterol, fluticasone furoate-vilanterol, morphine injection, ondansetron **OR** ondansetron (ZOFRAN) IV, oxyCODONE-acetaminophen, sodium chloride flush, zolpidem  Antimicrobials: Anti-infectives (From admission, onward)   Start     Dose/Rate Route Frequency Ordered Stop   09/05/19 1800  ceFAZolin (ANCEF) IVPB 1 g/50 mL premix     1 g 100 mL/hr over 30 Minutes Intravenous Every 24 hours 09/04/19 0939     09/04/19 1030  ceFAZolin (ANCEF) IVPB 1 g/50 mL premix     1 g 100 mL/hr over 30 Minutes  Intravenous  Once 09/04/19 0939 09/04/19 1629   08/31/19 1415  ceFAZolin (ANCEF) IVPB 2g/100 mL premix     2 g 200 mL/hr over 30 Minutes Intravenous  Once 08/31/19 1328 08/31/19 1358   08/31/19 1400  ceFAZolin (ANCEF) powder 2 g  Status:  Discontinued     2 g Other To Surgery 08/31/19 1314 08/31/19 1326   08/29/19 1145  ceFAZolin (ANCEF) IVPB 2g/100 mL premix     2 g 200 mL/hr over 30 Minutes Intravenous To ShortStay Surgical 08/29/19 1127 08/29/19 1445   08/23/19 2200  azithromycin (ZITHROMAX) tablet 500 mg  Status:  Discontinued    Note to Pharmacy: Pharmacy to adjust timing for next dose.   500 mg Oral Daily at bedtime 08/23/19 0233 08/25/19 2046   08/22/19 2130  azithromycin (ZITHROMAX) 500 mg in sodium chloride 0.9 % 250 mL IVPB     500 mg 250 mL/hr over 60 Minutes Intravenous  Once 08/22/19 2122 08/22/19 2245       I have personally reviewed the following labs and images: CBC: Recent Labs  Lab 09/03/19 0529 09/04/19 0518 09/05/19 0511 09/06/19 0523 09/07/19 0322  WBC 12.3* 15.7* 22.2* 10.3 11.4*  NEUTROABS  --   --  20.1*  --   --   HGB 12.1* 11.7* 11.1* 9.3* 9.7*  HCT 37.4* 36.0* 34.0* 29.0* 30.7*  MCV 99.5 99.7 100.3* 100.3* 101.7*  PLT 211 204 219 180 211   BMP &GFR Recent Labs  Lab 09/03/19 0529 09/05/19 0511 09/06/19 0523 09/07/19 0322 09/08/19 0500  NA 136 129* 133* 129* 131*  K 4.6 5.1 3.3* 3.3* 3.3*  CL 99 93* 100 97* 97*  CO2 24 20* 25 22 26   GLUCOSE 80 95 141* 126* 110*  BUN 68* 93* 38* 54* 34*  CREATININE 5.45* 7.10* 4.07* 4.80* 3.48*  CALCIUM 8.3* 7.9* 7.0* 7.1* 7.2*  MG  --  1.7 1.6*  --  1.7  PHOS  --  4.6 3.1 3.1 2.5   Estimated Creatinine Clearance: 26.3 mL/min (A) (by C-G formula based on SCr of 3.48 mg/dL (H)). Liver & Pancreas: Recent Labs  Lab 09/05/19 0511 09/06/19 0523 09/07/19 0322 09/08/19 0500  AST 14* 16  --  19  ALT 6 <5  --  <5  ALKPHOS 78 71  --  81  BILITOT 0.7 0.4  --  0.3  PROT 5.3* 4.8*  --  4.7*  ALBUMIN 1.8*  1.7* 1.6* 1.5*   No results for input(s): LIPASE, AMYLASE in the last 168 hours. No results for input(s): AMMONIA in the last 168 hours. Diabetic: No results for input(s): HGBA1C in the last 72 hours. Recent Labs  Lab 09/07/19 1658 09/07/19 2007 09/08/19 0013 09/08/19 0427 09/08/19 0739  GLUCAP 125* 78 130* 123* 109*   Cardiac Enzymes: No results for input(s): CKTOTAL, CKMB, CKMBINDEX, TROPONINI in the last 168 hours. No results for input(s): PROBNP in the last 8760 hours. Coagulation Profile: No results for input(s): INR, PROTIME in the last 168 hours. Thyroid Function Tests: No results for input(s): TSH, T4TOTAL, FREET4, T3FREE, THYROIDAB in the last 72 hours. Lipid Profile: No results for input(s): CHOL, HDL, LDLCALC, TRIG, CHOLHDL, LDLDIRECT in the last 72 hours. Anemia Panel: Recent Labs    09/07/19 0322  FERRITIN 157  TIBC 134*  IRON 14*   Urine analysis:    Component Value Date/Time   COLORURINE YELLOW 05/27/2019 0524   APPEARANCEUR CLEAR 05/27/2019 0524   LABSPEC 1.025 05/27/2019 0524   PHURINE 6.0 05/27/2019 0524   GLUCOSEU 150 (A) 05/27/2019 0524   HGBUR MODERATE (A) 05/27/2019 0524   BILIRUBINUR NEGATIVE 05/27/2019 0524   KETONESUR NEGATIVE 05/27/2019 0524   PROTEINUR 100 (A) 05/27/2019 0524   UROBILINOGEN 0.2 03/03/2010 1056   NITRITE NEGATIVE 05/27/2019 0524   LEUKOCYTESUR TRACE (A) 05/27/2019 0524   Sepsis Labs: Invalid input(s): PROCALCITONIN, [place]  Microbiology: No results found for this or any previous visit (from the past 240 hour(s)).  Radiology Studies: No results found.   Evanne Matsunaga T. Ahmeek  If 7PM-7AM, please contact night-coverage [email or web address] Password [facility] 09/08/2019, 11:27 AM

## 2019-09-09 LAB — CBC
HCT: 30 % — ABNORMAL LOW (ref 39.0–52.0)
Hemoglobin: 9.4 g/dL — ABNORMAL LOW (ref 13.0–17.0)
MCH: 31.6 pg (ref 26.0–34.0)
MCHC: 31.3 g/dL (ref 30.0–36.0)
MCV: 101 fL — ABNORMAL HIGH (ref 80.0–100.0)
Platelets: 214 10*3/uL (ref 150–400)
RBC: 2.97 MIL/uL — ABNORMAL LOW (ref 4.22–5.81)
RDW: 14.9 % (ref 11.5–15.5)
WBC: 11.3 10*3/uL — ABNORMAL HIGH (ref 4.0–10.5)
nRBC: 0 % (ref 0.0–0.2)

## 2019-09-09 LAB — RENAL FUNCTION PANEL
Albumin: 1.5 g/dL — ABNORMAL LOW (ref 3.5–5.0)
Anion gap: 10 (ref 5–15)
BUN: 47 mg/dL — ABNORMAL HIGH (ref 8–23)
CO2: 25 mmol/L (ref 22–32)
Calcium: 7.2 mg/dL — ABNORMAL LOW (ref 8.9–10.3)
Chloride: 96 mmol/L — ABNORMAL LOW (ref 98–111)
Creatinine, Ser: 4.39 mg/dL — ABNORMAL HIGH (ref 0.61–1.24)
GFR calc Af Amer: 15 mL/min — ABNORMAL LOW (ref >60)
GFR calc non Af Amer: 13 mL/min — ABNORMAL LOW (ref >60)
Glucose, Bld: 95 mg/dL (ref 70–99)
Phosphorus: 3 mg/dL (ref 2.5–4.6)
Potassium: 3.9 mmol/L (ref 3.5–5.1)
Sodium: 131 mmol/L — ABNORMAL LOW (ref 135–145)

## 2019-09-09 LAB — MAGNESIUM: Magnesium: 1.6 mg/dL — ABNORMAL LOW (ref 1.7–2.4)

## 2019-09-09 LAB — GLUCOSE, CAPILLARY
Glucose-Capillary: 122 mg/dL — ABNORMAL HIGH (ref 70–99)
Glucose-Capillary: 86 mg/dL (ref 70–99)
Glucose-Capillary: 87 mg/dL (ref 70–99)
Glucose-Capillary: 92 mg/dL (ref 70–99)
Glucose-Capillary: 95 mg/dL (ref 70–99)

## 2019-09-09 MED ORDER — SODIUM CHLORIDE 0.9 % IV SOLN
INTRAVENOUS | Status: DC | PRN
  Administered 2019-09-09: 250 mL via INTRAVENOUS

## 2019-09-09 MED ORDER — HEPARIN SODIUM (PORCINE) 1000 UNIT/ML IJ SOLN
INTRAMUSCULAR | Status: AC
  Administered 2019-09-09: 4200 [IU]
  Filled 2019-09-09: qty 3

## 2019-09-09 NOTE — Progress Notes (Signed)
4 Days Post-Op    CC: Abdominal pain  Subjective: Pt up in the chair, asking about going home.  He has not walked much this week, and we told him he had to be mobilizing safely.  He is going to be home alone during the day at least.  Midline wound had a lot of drainage.  Ongoing erythema, and swelling.  We removed his staples.   Objective: Vital signs in last 24 hours: Temp:  [97.7 F (36.5 C)-99 F (37.2 C)] 97.9 F (36.6 C) (05/28 0716) Pulse Rate:  [63-100] 100 (05/28 0730) Resp:  [14-22] 22 (05/28 0730) BP: (90-135)/(70-89) 105/72 (05/28 0730) SpO2:  [93 %-100 %] 98 % (05/28 0716) FiO2 (%):  [28 %] 28 % (05/27 0814) Weight:  [86.6 kg-86.9 kg] 86.6 kg (05/28 0716) Last BM Date: 09/07/19 780 p.o. 546 IV 475 urine Afebrile vital signs are stable Mag 1.6, potassium 3.9 Creatinine 4.39 WBC 11.3 H/H 9.4/30 Intake/Output from previous day: 05/27 0701 - 05/28 0700 In: 1326.3 [P.O.:780; I.V.:546.3] Out: 475 [Urine:475] Intake/Output this shift: No intake/output data recorded.  General appearance: alert, cooperative and no distress Resp: clear to auscultation bilaterally GI: still distended some, + BS, + BM.  Midline is draining purulent fluid, erytnematous with swelling.  Staples removed.    Lab Results:  Recent Labs    09/07/19 0322 09/09/19 0500  WBC 11.4* 11.3*  HGB 9.7* 9.4*  HCT 30.7* 30.0*  PLT 211 214    BMET Recent Labs    09/08/19 0500 09/09/19 0500  NA 131* 131*  K 3.3* 3.9  CL 97* 96*  CO2 26 25  GLUCOSE 110* 95  BUN 34* 47*  CREATININE 3.48* 4.39*  CALCIUM 7.2* 7.2*   PT/INR No results for input(s): LABPROT, INR in the last 72 hours.  Recent Labs  Lab 09/05/19 0511 09/06/19 0523 09/07/19 0322 09/08/19 0500 09/09/19 0500  AST 14* 16  --  19  --   ALT 6 <5  --  <5  --   ALKPHOS 78 71  --  81  --   BILITOT 0.7 0.4  --  0.3  --   PROT 5.3* 4.8*  --  4.7*  --   ALBUMIN 1.8* 1.7* 1.6* 1.5* 1.5*     Lipase     Component Value  Date/Time   LIPASE 23 08/25/2019 1740     Medications: . apixaban  5 mg Oral BID  . atorvastatin  80 mg Oral Daily  . budesonide (PULMICORT) nebulizer solution  0.25 mg Nebulization BID  . calcium acetate  1,334 mg Oral TID WC  . Chlorhexidine Gluconate Cloth  6 each Topical Daily  . darbepoetin (ARANESP) injection - DIALYSIS  60 mcg Intravenous Q Wed-HD  . gabapentin  300 mg Oral QHS  . gentamicin cream  1 application Topical Daily  . guaiFENesin-dextromethorphan  5 mL Oral Q8H  . ipratropium-albuterol  3 mL Nebulization BID  . metoprolol tartrate  25 mg Oral BID  . multivitamin  1 tablet Oral QHS  . pantoprazole  40 mg Oral BID  . potassium chloride  20 mEq Oral BID  . sodium chloride flush  10-40 mL Intracatheter Q12H    Assessment/Plan CHF COPD A fib  Polycystic kidney disease, ESRD, on PD - converted to HD temporarily H/O MI - plavix on hold as of 5/13 CAD Hep C HLD Hypomagnesemia  SBO Exploratory laparotomy, LOA, repair of SB serosal injury x3, resection of mesenteric nodule 08/29/19 Dr. Bobbye Morton  POD#  11 Exploratory laparotomy, washout, abdominal wall closure with placement of retention sutures for fascial dehiscence 09/05/19 Dr. Georgette Dover POD# 4 - AFVSS, WBC 10.5>>15.7>>22.2>>10.3>>11.4>>11.3 -increased flatus and one BM, allow FLD and advance to renal diet as tolerated - OOB/mobilize, PT/OT - IS 10x q 1h   - staples removed 5/28 >> wet to dry dressing and binder  FEN -  carb mod/renal diet/ TPN - OK to wean VTE - SCDs,Eliquis ID -Ancef for cellulitis 5/23 >>day 5   Plan:  From our standpoint he can most likely go home early next week.  We need to see his midline wound improve, his WBC return to normal, he needs to be ambulating safely, and home health arrangements completed.  He will need BID dressing changes, initially and the daily dressing changes after it heals in some.  He may understand this at some point, but he forgets easily.  He has ask what the  retention sutures are daily for the last 2 days.  He does understand if they fail he is in grave danger.   LOS: 18 days    Aaron [name] 09/09/2019 Please see Amion

## 2019-09-09 NOTE — Progress Notes (Signed)
Physical Therapy Treatment Patient Details Name: Aaron [name] MRN: [id number] DOB: Jan 15, 1956 Today's Date: 09/09/2019    History of Present Illness Pt is a 64 y/o male admitted secondary to worsening SOB thought to be from COPD exacerbation. Pt also with a fib and RVR. PMH includes HTN, COPD, ESRD on PD, CHF, and a fib.     PT Comments    Pt admitted with above diagnosis. Pt was able to ambulate with min guard assist short distance with RW.  Pt needs cues for postural stability. Pt with no apparent LOB.   Pt currently with functional limitations due to balance and endurance deficits.  Pt will benefit from skilled PT to increase their independence and safety with mobility to allow discharge to the venue listed below.     Follow Up Recommendations  Home health PT     Equipment Recommendations  None recommended by PT    Recommendations for Other Services       Precautions / Restrictions Precautions Precautions: Fall Precaution Comments: large abdominal incision Restrictions Weight Bearing Restrictions: No    Mobility  Bed Mobility Overal bed mobility: Needs Assistance Bed Mobility: Supine to Sit Rolling: Modified independent (Device/Increase time)   Supine to sit: Supervision Sit to supine: Modified independent (Device/Increase time)      Transfers Overall transfer level: Needs assistance Equipment used: Rolling walker (2 wheeled) Transfers: Sit to/from Stand Sit to Stand: Min guard         General transfer comment: no physical assist at this time, guard assist and cues for hand placement  Ambulation/Gait Ambulation/Gait assistance: Min guard Gait Distance (Feet): 70 Feet Assistive device: Rolling walker (2 wheeled) Gait Pattern/deviations: Step-through pattern;Decreased stride length;Wide base of support;Drifts right/left Gait velocity: Decreased Gait velocity interpretation: <1.31 ft/sec, indicative of household ambulator General Gait Details: heavy use of  the RW with flexed posture.  When cued, pt could attain submaximal upright posture, but unable to hold during gait due to abdominal pain.  Pt's vitals difficult to check with pulse ox, but finally got 95% for SpO2 on RA.  HR 100 - 125 bpm   Stairs             Wheelchair Mobility    Modified Rankin (Stroke Patients Only)       Balance Overall balance assessment: Needs assistance Sitting-balance support: No upper extremity supported Sitting balance-Leahy Scale: Good     Standing balance support: Bilateral upper extremity supported;During functional activity Standing balance-Leahy Scale: Poor Standing balance comment: Reliant on BUE support during gait                            Cognition Arousal/Alertness: Awake/alert Behavior During Therapy: WFL for tasks assessed/performed Overall Cognitive Status: Within Functional Limits for tasks assessed                                        Exercises General Exercises - Lower Extremity Ankle Circles/Pumps: AROM;AAROM;5 reps Long Arc Quad: AROM;Both;10 reps;Seated Heel Slides: AROM;AAROM;10 reps    General Comments        Pertinent Vitals/Pain Pain Assessment: 0-10 Faces Pain Scale: Hurts even more Pain Location: abdomen  Pain Descriptors / Indicators: Grimacing Pain Intervention(s): Limited activity within patient's tolerance;Monitored during session;Premedicated before session;Repositioned    Home Living  Prior Function            PT Goals (current goals can now be found in the care plan section) Acute Rehab PT Goals Patient Stated Goal: to get stronger Progress towards PT goals: Progressing toward goals    Frequency    Min 3X/week      PT Plan Current plan remains appropriate    Co-evaluation              AM-PAC PT "6 Clicks" Mobility   Outcome Measure  Help needed turning from your back to your side while in a flat bed without using  bedrails?: None Help needed moving from lying on your back to sitting on the side of a flat bed without using bedrails?: None Help needed moving to and from a bed to a chair (including a wheelchair)?: A Little Help needed standing up from a chair using your arms (e.g., wheelchair or bedside chair)?: A Little Help needed to walk in hospital room?: A Little Help needed climbing 3-5 steps with a railing? : A Little 6 Click Score: 20    End of Session Equipment Utilized During Treatment: Gait belt Activity Tolerance: Patient tolerated treatment well;Patient limited by fatigue Patient left: in chair;with call bell/phone within reach;with chair alarm set Nurse Communication: Mobility status PT Visit Diagnosis: Other abnormalities of gait and mobility (R26.89);Muscle weakness (generalized) (M62.81);Difficulty in walking, not elsewhere classified (R26.2)     Time: 7939-0300 PT Time Calculation (min) (ACUTE ONLY): 19 min  Charges:  $Gait Training: 8-22 mins                     Teryn Gust W,PT [place] Pager:  [phone]  Office:  [place] 09/09/2019, 2:58 PM

## 2019-09-09 NOTE — TOC Progression Note (Addendum)
Transition of Care [facility]) - Progression Note    Patient Details  Name: Aaron [name] MRN: [id number] Date of Birth: 09/12/1955  Transition of Care [facility]) CM/SW Contact  Zenon Mayo, RN Phone Number: 09/09/2019, 4:50 PM  Clinical Narrative:    Patient is set up with [place], [place], HHOT, HHAIDE , he is off the TPN, he is a HD patient in which he will start HD on Monday per CSW navigator note.   NCM informed Tiffany with [facility] that patient may dc over the weekend.  He is also set up with  [name] with [facility] for palliative services.    Expected Discharge Plan: [place] Barriers to Discharge: Continued Medical Work up  Expected Discharge Plan and Services Expected Discharge Plan: [place]   Discharge Planning Services: CM Consult Post Acute Care Choice: [place] arrangements for the past 2 months: Single Family Home                   DME Agency: NA       HH Arranged: RN, PT, OT, Nurse's Aide, Social Work [employer] Agency: [facility] [employer] [facility] [employer] Date [place]: 09/01/19 Time [place]: 1700 Representative spoke with at [place]: [place] ([place]) Interventions    Readmission Risk Interventions Readmission Risk Prevention Plan 09/01/2019 12/08/2017  Transportation Screening Complete Complete  PCP or Specialist Appt within 3-5 Days Complete -  HRI or [place] Complete -  Social Work Consult for [place] Planning/Counseling Complete -  Palliative Care Screening Complete -  Medication Review Press photographer) Complete Complete  HRI or Home Care Consult - Complete  Some recent data might be hidden

## 2019-09-09 NOTE — Progress Notes (Signed)
PROGRESS NOTE  Aaron [name] HAL:[id number] DOB: 12/21/1955   PCP: Gwenlyn Saran Taylor  Patient is from: Home.  DOA: 08/22/2019 LOS: 29  Brief Narrative / Interim history: 64 year old male with history of A. fib on Eliquis, ESRD on PD, COPD, former smoker, HTN and HLD presented to [facility], ED with progressive shortness of breath.  In ED, found to be in A. fib with RVR and started on Cardizem drip.  Transferred to [facility] for further care given ESRD on PD.  On 5/13, developed intermittent biliary emesis.  CT abdomen and pelvis revealed SBO.  General surgery consulted 5/14.  He underwent ex lap on 5/17.  He was noted to have drainage from abdominal wound incision.  He underwent repeat ex lap, washout and placement of retention sutures on 5/24.  Started TPN via PICC line on 5/18.  P.o. intake improved.  Off TPN on 5/27.  Patient started on HD through right IJ HD cath on 5/18.  Left IJ TDC on 5/19 due right IJ HD cath malfunction. He is now on HD MWF.  He has outpatient dialysis spot.  Cleared for discharge from nephrology standpoint.  Patient thinks he can go home.  However, he lives alone and he has not been able to participate in therapy for over a week now.  He says, "family lives 3 minutes away".  Subjective: No major events overnight or this morning.  No complaints.  Tolerated diet but didn't like his breakfast this morning.  Denies chest pain, dyspnea or GI symptoms.  He says last BM was 2 days ago.  Passing a lot of gas.  Objective: Vitals:   09/09/19 0930 09/09/19 1000 09/09/19 1024 09/09/19 1056  BP:   108/64 105/74  Pulse:   84 82  Resp:   17 20  Temp:   97.8 F (36.6 C) 97.8 F (36.6 C)  TempSrc:   Oral Oral  SpO2: 95% 95% 95% 100%  Weight:      Height:        Intake/Output Summary (Last 24 hours) at 09/09/2019 1225 Last data filed at 09/09/2019 1024 Gross per 24 hour  Intake 1026.31 ml  Output 2275 ml  Net -1248.69 ml   Filed Weights   09/08/19  0048 09/09/19 0403 09/09/19 0716  Weight: 85.6 kg 86.9 kg 86.6 kg    Examination:  GENERAL: No apparent distress.  Nontoxic. HEENT: MMM.  Vision and hearing grossly intact.  NECK: Supple.  No apparent JVD.  RESP: On 2 L by [place].  No IWOB.  Fair aeration bilaterally. CVS:  RRR. Heart sounds normal.  ABD/GI/GU: BS+. Abd soft, NTND.  Surgical wound appears clean with very minimal drainage on dressing MSK/EXT:  Moves extremities. No apparent deformity. No edema.  SKIN: Abdominal surgical wound as above. NEURO: Awake, alert and oriented appropriately.  No apparent focal neuro deficit. PSYCH: Calm. Normal affect.  Procedures:  -5/17-ex lap -5/24-ex lap  Microbiology summarized: 5/10-COVID-19 PCR negative 5/10-blood cultures negative  Assessment & Plan: Small bowel obstruction secondary to adhesion: wound appears clean except for mild erythema -Ex lap on 5/17 and 5/24. -TPN 5/18-5/27.  Tolerating renal/carb modified diet now. -Oxycodone as needed for pain.  Discontinued IV morphine. -On IV Ancef for cellulitis wound? -General surgery following.  History of ESRD on peritoneal dialysis -Converted to HD MWF through [facility] for 1 to 2 months after surgery (5/17) -Also making some urine.  -Cleared for discharge from nephrology standpoint.  COPD with acute exacerbation: Improved -Continue bronchodilators and  mucolytic's -Wean oxygen as able.  Chronic A. fib with RVR: RVR resolved. -Continue p.o. metoprolol -Continue Eliquis for anticoagulation  Anemia of renal disease: Relatively stable.  Anemia panel with both iron deficiency and anemia of chronic disease.  -Per nephrology  Essential hypertension -Cardiac meds as above.  Hypokalemia/Hypomagnesemia  -Monitor and replenish  Hyponatremia: In the setting of ESRD.  Stable.  Leukocytosis/bandemia: demargination versus infection.  Improved. -Continue Ancef as above -Continue monitoring  Debility/physical deconditioning: Patient  has not been able to participate in therapy in over a week due to weakness and fatigue -Asked therapy to reevaluate to make sure he can be safely discharged home with home health.  Moderate malnutrition Nutrition Problem: Moderate Malnutrition Etiology: chronic illness(COPD, ESRD on PD) Signs/Symptoms: energy intake < 75% for > or equal to 1 month, mild fat depletion, moderate fat depletion, mild muscle depletion, moderate muscle depletion Interventions: TPN, [place]   DVT prophylaxis: On Eliquis for A. fib Code Status: DNR/DNI Family Communication: Patient and/or RN. Available if any question.  Status is: Inpatient  Remains inpatient appropriate because:Unsafe d/c plan and Inpatient level of care appropriate due to severity of illness.    Dispo: The patient is from: Home              Anticipated d/c is to: Home              Anticipated d/c date is: 1 day              Patient currently is not medically stable to d/c.        Consultants:  General surgery Nephrology PMT   Sch Meds:  Scheduled Meds: . apixaban  5 mg Oral BID  . atorvastatin  80 mg Oral Daily  . budesonide (PULMICORT) nebulizer solution  0.25 mg Nebulization BID  . calcium acetate  1,334 mg Oral TID WC  . Chlorhexidine Gluconate Cloth  6 each Topical Daily  . darbepoetin (ARANESP) injection - DIALYSIS  60 mcg Intravenous Q Wed-HD  . gabapentin  300 mg Oral QHS  . gentamicin cream  1 application Topical Daily  . guaiFENesin-dextromethorphan  5 mL Oral Q8H  . ipratropium-albuterol  3 mL Nebulization BID  . metoprolol tartrate  25 mg Oral BID  . multivitamin  1 tablet Oral QHS  . pantoprazole  40 mg Oral BID  . potassium chloride  20 mEq Oral BID  . sodium chloride flush  10-40 mL Intracatheter Q12H   Continuous Infusions: .  ceFAZolin (ANCEF) IV 1 g (09/08/19 1748)   PRN Meds:.acetaminophen **OR** acetaminophen, albuterol, fluticasone furoate-vilanterol, morphine injection, ondansetron **OR**  ondansetron (ZOFRAN) IV, oxyCODONE-acetaminophen, sodium chloride flush, zolpidem  Antimicrobials: Anti-infectives (From admission, onward)   Start     Dose/Rate Route Frequency Ordered Stop   09/05/19 1800  ceFAZolin (ANCEF) IVPB 1 g/50 mL premix     1 g 100 mL/hr over 30 Minutes Intravenous Every 24 hours 09/04/19 0939     09/04/19 1030  ceFAZolin (ANCEF) IVPB 1 g/50 mL premix     1 g 100 mL/hr over 30 Minutes Intravenous  Once 09/04/19 0939 09/04/19 1629   08/31/19 1415  ceFAZolin (ANCEF) IVPB 2g/100 mL premix     2 g 200 mL/hr over 30 Minutes Intravenous  Once 08/31/19 1328 08/31/19 1358   08/31/19 1400  ceFAZolin (ANCEF) powder 2 g  Status:  Discontinued     2 g Other To Surgery 08/31/19 1314 08/31/19 1326   08/29/19 1145  ceFAZolin (ANCEF) IVPB  2g/100 mL premix     2 g 200 mL/hr over 30 Minutes Intravenous To ShortStay Surgical 08/29/19 1127 08/29/19 1445   08/23/19 2200  azithromycin (ZITHROMAX) tablet 500 mg  Status:  Discontinued    Note to Pharmacy: Pharmacy to adjust timing for next dose.   500 mg Oral Daily at bedtime 08/23/19 0233 08/25/19 2046   08/22/19 2130  azithromycin (ZITHROMAX) 500 mg in sodium chloride 0.9 % 250 mL IVPB     500 mg 250 mL/hr over 60 Minutes Intravenous  Once 08/22/19 2122 08/22/19 2245       I have personally reviewed the following labs and images: CBC: Recent Labs  Lab 09/04/19 0518 09/05/19 0511 09/06/19 0523 09/07/19 0322 09/09/19 0500  WBC 15.7* 22.2* 10.3 11.4* 11.3*  NEUTROABS  --  20.1*  --   --   --   HGB 11.7* 11.1* 9.3* 9.7* 9.4*  HCT 36.0* 34.0* 29.0* 30.7* 30.0*  MCV 99.7 100.3* 100.3* 101.7* 101.0*  PLT 204 219 180 211 214   BMP &GFR Recent Labs  Lab 09/05/19 0511 09/06/19 0523 09/07/19 0322 09/08/19 0500 09/09/19 0500  NA 129* 133* 129* 131* 131*  K 5.1 3.3* 3.3* 3.3* 3.9  CL 93* 100 97* 97* 96*  CO2 20* 25 22 26 25   GLUCOSE 95 141* 126* 110* 95  BUN 93* 38* 54* 34* 47*  CREATININE 7.10* 4.07* 4.80* 3.48*  4.39*  CALCIUM 7.9* 7.0* 7.1* 7.2* 7.2*  MG 1.7 1.6*  --  1.7 1.6*  PHOS 4.6 3.1 3.1 2.5 3.0   Estimated Creatinine Clearance: 21.1 mL/min (A) (by C-G formula based on SCr of 4.39 mg/dL (H)). Liver & Pancreas: Recent Labs  Lab 09/05/19 0511 09/06/19 0523 09/07/19 0322 09/08/19 0500 09/09/19 0500  AST 14* 16  --  19  --   ALT 6 <5  --  <5  --   ALKPHOS 78 71  --  81  --   BILITOT 0.7 0.4  --  0.3  --   PROT 5.3* 4.8*  --  4.7*  --   ALBUMIN 1.8* 1.7* 1.6* 1.5* 1.5*   No results for input(s): LIPASE, AMYLASE in the last 168 hours. No results for input(s): AMMONIA in the last 168 hours. Diabetic: No results for input(s): HGBA1C in the last 72 hours. Recent Labs  Lab 09/08/19 1634 09/08/19 2010 09/09/19 0013 09/09/19 0404 09/09/19 1057  GLUCAP 104* 92 87 92 86   Cardiac Enzymes: No results for input(s): CKTOTAL, CKMB, CKMBINDEX, TROPONINI in the last 168 hours. No results for input(s): PROBNP in the last 8760 hours. Coagulation Profile: No results for input(s): INR, PROTIME in the last 168 hours. Thyroid Function Tests: No results for input(s): TSH, T4TOTAL, FREET4, T3FREE, THYROIDAB in the last 72 hours. Lipid Profile: No results for input(s): CHOL, HDL, LDLCALC, TRIG, CHOLHDL, LDLDIRECT in the last 72 hours. Anemia Panel: Recent Labs    09/07/19 0322  FERRITIN 157  TIBC 134*  IRON 14*   Urine analysis:    Component Value Date/Time   COLORURINE YELLOW 05/27/2019 0524   APPEARANCEUR CLEAR 05/27/2019 0524   LABSPEC 1.025 05/27/2019 0524   PHURINE 6.0 05/27/2019 0524   GLUCOSEU 150 (A) 05/27/2019 0524   HGBUR MODERATE (A) 05/27/2019 0524   BILIRUBINUR NEGATIVE 05/27/2019 0524   KETONESUR NEGATIVE 05/27/2019 0524   PROTEINUR 100 (A) 05/27/2019 0524   UROBILINOGEN 0.2 03/03/2010 1056   NITRITE NEGATIVE 05/27/2019 0524   LEUKOCYTESUR TRACE (A) 05/27/2019 0524   Sepsis  Labs: Invalid input(s): PROCALCITONIN, [place]  Microbiology: No results found for  this or any previous visit (from the past 240 hour(s)).  Radiology Studies: No results found.   Lamonta Cypress T. [place]  If 7PM-7AM, please contact night-coverage [email or web address] Password [facility] 09/09/2019, 12:25 PM

## 2019-09-09 NOTE — Progress Notes (Signed)
PT Cancellation Note  Patient Details Name: NADIM MALIA MRN: [id number] DOB: 05-10-1955   Cancelled Treatment:    Reason Eval/Treat Not Completed: Patient at procedure or test/unavailable(Pt in HD. Will return as able. )   Denice Paradise 09/09/2019, 8:48 AM  W,PT Acute Rehabilitation Services Pager:  [phone]  Office:  [phone]

## 2019-09-09 NOTE — Progress Notes (Signed)
[facility] Progress Note     Assessment/ Plan:   1. SOB/COPD/pulmonary edema-Felt to be multifactorial - COPD/ Afib with rapid rate and volume overload. Converted to HD for ultrafiltration.  S/p Steroids/inhalers/azithro.  Improved   2. ESRD-secondary to polycystic kidney disease. Has been on PD for 6 mos. followed in [place]. Now converted to HD in light of major abd surgery but PD catheter remains in place. Appreciate the [facility]J TC 5/19 by VIR.  HD today and tolerating well.  Increasing ultrafiltration as he tolerates has had some hypotension in the past.   Has spot at [facility] 2nd shift (MWF regimen) 1145AM.  Not clear how feasible transition back to PD will be but he would really prefer that if possible - will need to address with surgeon at outpt f/u.  We are flushing his peritoneal dialysis catheter once a week permitted by surgery.  3. Anemia- Hb 9s, started low dose ESA.  Iron sat low at 10%, will give IV iron after antibiotic course complete given cellulitis seemed quite significant.  Unclear when course will be over  4. Secondary hyperparathyroidism -labs acceptable.  Phosphorus 3.  Can hold PhosLo for now. 5. HTN/volume-continue carvedilol and diltiazem for atrial fibrillation.  6. Leukocytosis:much improved from 22 to 10 -11s after abd washout + ancef for cellulitis.  PD fluid had been negative for infection earlier in hospitalization 7. Partial small bowel obstruction: Better now, tolerating PO. Surgery following and appreciate assistance; dense adhesions on 5/17. Back to OR earlier this week for washout and retention sutures - seems to be ok for now.  Diet advancing but continues on TPN 8. Hyperdense kidney cyst: Consider further evaluation with MRI in the future.  [place] for d/c from renal perspective.    Subjective:   Patient feels like his bowel function continues to improve.  He has some abdominal pain but otherwise feels pretty well.  He  really wants to go home as soon as possible.    Objective:   BP 105/74 (BP Location: Right Arm)   Pulse 82   Temp 97.8 F (36.6 C) (Oral)   Resp 20   Ht 6\' 4"  (1.93 m)   Wt 86.6 kg   SpO2 100%   BMI 23.24 kg/m   Intake/Output Summary (Last 24 hours) at 09/09/2019 1251 Last data filed at 09/09/2019 1024 Gross per 24 hour  Intake 1026.31 ml  Output 2275 ml  Net -1248.69 ml   Weight change: 2.609 kg  Physical Exam: General:Sitting in bed, no distress Heart:Normal rate Lungs:Bilateral chest rise, no increased work of breathing Abdomen:Distended with abdominal binder in place Extremities: Trace edema Dialysis Access: PD cath,LIJ TC 5/19 Access: Lt arm PICC  Imaging: No results found.  Labs: BMET Recent Labs  Lab 09/03/19 0529 09/05/19 0511 09/06/19 0523 09/07/19 0322 09/08/19 0500 09/09/19 0500  NA 136 129* 133* 129* 131* 131*  K 4.6 5.1 3.3* 3.3* 3.3* 3.9  CL 99 93* 100 97* 97* 96*  CO2 24 20* 25 22 26 25   GLUCOSE 80 95 141* 126* 110* 95  BUN 68* 93* 38* 54* 34* 47*  CREATININE 5.45* 7.10* 4.07* 4.80* 3.48* 4.39*  CALCIUM 8.3* 7.9* 7.0* 7.1* 7.2* 7.2*  PHOS  --  4.6 3.1 3.1 2.5 3.0   CBC Recent Labs  Lab 09/05/19 0511 09/06/19 0523 09/07/19 0322 09/09/19 0500  WBC 22.2* 10.3 11.4* 11.3*  NEUTROABS 20.1*  --   --   --   HGB 11.1* 9.3* 9.7* 9.4*  HCT 34.0* 29.0* 30.7* 30.0*  MCV 100.3* 100.3* 101.7* 101.0*  PLT 219 180 211 214    Medications:    . apixaban  5 mg Oral BID  . atorvastatin  80 mg Oral Daily  . budesonide (PULMICORT) nebulizer solution  0.25 mg Nebulization BID  . calcium acetate  1,334 mg Oral TID WC  . Chlorhexidine Gluconate Cloth  6 each Topical Daily  . darbepoetin (ARANESP) injection - DIALYSIS  60 mcg Intravenous Q Wed-HD  . gabapentin  300 mg Oral QHS  . gentamicin cream  1 application Topical Daily  . guaiFENesin-dextromethorphan  5 mL Oral Q8H  . ipratropium-albuterol  3 mL Nebulization BID  . metoprolol tartrate   25 mg Oral BID  . multivitamin  1 tablet Oral QHS  . pantoprazole  40 mg Oral BID  . potassium chloride  20 mEq Oral BID  . sodium chloride flush  10-40 mL Intracatheter Q12H

## 2019-09-09 NOTE — Progress Notes (Signed)
Renal Navigator reviewed notes and it appears patient may discharge over the weekend, in which case, he will be at his clinic on Monday for HD. Patient told Navigator this week that this is what he is anticipating and hoping for. He knows his schedule and is prepared to start in the OP HD clinic/[facility] after hospital discharge. He has Medicaid, which he can use for transportation, if he cannot drive or ask family to drive him, which has previously been discussed with him.  Navigator faxed today's Renal and Attending notes to OP HD clinic to update them and prepare them for patient's start at the clinic (on HD) on Monday, 09/12/19. (Navigator will not be in the office Monday due to the holiday).  Alphonzo Cruise, [place] Renal Navigator [phone]

## 2019-09-10 LAB — MAGNESIUM: Magnesium: 1.5 mg/dL — ABNORMAL LOW (ref 1.7–2.4)

## 2019-09-10 LAB — CBC WITH DIFFERENTIAL/PLATELET
Abs Immature Granulocytes: 0.06 10*3/uL (ref 0.00–0.07)
Basophils Absolute: 0.1 10*3/uL (ref 0.0–0.1)
Basophils Relative: 1 %
Eosinophils Absolute: 0.2 10*3/uL (ref 0.0–0.5)
Eosinophils Relative: 2 %
HCT: 30.5 % — ABNORMAL LOW (ref 39.0–52.0)
Hemoglobin: 9.5 g/dL — ABNORMAL LOW (ref 13.0–17.0)
Immature Granulocytes: 1 %
Lymphocytes Relative: 9 %
Lymphs Abs: 0.9 10*3/uL (ref 0.7–4.0)
MCH: 31.7 pg (ref 26.0–34.0)
MCHC: 31.1 g/dL (ref 30.0–36.0)
MCV: 101.7 fL — ABNORMAL HIGH (ref 80.0–100.0)
Monocytes Absolute: 0.4 10*3/uL (ref 0.1–1.0)
Monocytes Relative: 4 %
Neutro Abs: 8.8 10*3/uL — ABNORMAL HIGH (ref 1.7–7.7)
Neutrophils Relative %: 83 %
Platelets: 240 10*3/uL (ref 150–400)
RBC: 3 MIL/uL — ABNORMAL LOW (ref 4.22–5.81)
RDW: 15.2 % (ref 11.5–15.5)
WBC: 10.4 10*3/uL (ref 4.0–10.5)
nRBC: 0 % (ref 0.0–0.2)

## 2019-09-10 LAB — GLUCOSE, CAPILLARY
Glucose-Capillary: 103 mg/dL — ABNORMAL HIGH (ref 70–99)
Glucose-Capillary: 110 mg/dL — ABNORMAL HIGH (ref 70–99)
Glucose-Capillary: 114 mg/dL — ABNORMAL HIGH (ref 70–99)
Glucose-Capillary: 118 mg/dL — ABNORMAL HIGH (ref 70–99)
Glucose-Capillary: 141 mg/dL — ABNORMAL HIGH (ref 70–99)
Glucose-Capillary: 91 mg/dL (ref 70–99)

## 2019-09-10 LAB — RENAL FUNCTION PANEL
Albumin: 1.6 g/dL — ABNORMAL LOW (ref 3.5–5.0)
Anion gap: 9 (ref 5–15)
BUN: 34 mg/dL — ABNORMAL HIGH (ref 8–23)
CO2: 25 mmol/L (ref 22–32)
Calcium: 7.4 mg/dL — ABNORMAL LOW (ref 8.9–10.3)
Chloride: 99 mmol/L (ref 98–111)
Creatinine, Ser: 3.48 mg/dL — ABNORMAL HIGH (ref 0.61–1.24)
GFR calc Af Amer: 20 mL/min — ABNORMAL LOW (ref >60)
GFR calc non Af Amer: 18 mL/min — ABNORMAL LOW (ref >60)
Glucose, Bld: 109 mg/dL — ABNORMAL HIGH (ref 70–99)
Phosphorus: 2.4 mg/dL — ABNORMAL LOW (ref 2.5–4.6)
Potassium: 4.2 mmol/L (ref 3.5–5.1)
Sodium: 133 mmol/L — ABNORMAL LOW (ref 135–145)

## 2019-09-10 MED ORDER — POLYETHYLENE GLYCOL 3350 17 G PO PACK: 17.0000 g | PACK | Freq: Every day | ORAL | Status: DC | PRN

## 2019-09-10 MED ORDER — DOCUSATE SODIUM 100 MG PO CAPS
100.0000 mg | ORAL_CAPSULE | Freq: Every day | ORAL | Status: DC
  Administered 2019-09-10 – 2019-09-11 (×2): 100 mg via ORAL
  Filled 2019-09-10 (×2): qty 1

## 2019-09-10 MED ORDER — SENNOSIDES-DOCUSATE SODIUM 8.6-50 MG PO TABS: 1.0000 | ORAL_TABLET | Freq: Two times a day (BID) | ORAL | Status: DC | PRN

## 2019-09-10 MED ORDER — MAGNESIUM SULFATE 4 GM/100ML IV SOLN
4.0000 g | Freq: Once | INTRAVENOUS | Status: AC
  Administered 2019-09-10: 4 g via INTRAVENOUS
  Filled 2019-09-10: qty 100

## 2019-09-10 NOTE — Progress Notes (Signed)
PROGRESS NOTE  Aaron [name] UUV:[id number] DOB: 07-25-1955   PCP: Gwenlyn Saran Davis  Patient is from: Home.  DOA: 08/22/2019 LOS: 108  Brief Narrative / Interim history: 64 year old male with history of A. fib on Eliquis, ESRD on PD, COPD, former smoker, HTN and HLD presented to [facility], ED with progressive shortness of breath.  In ED, found to be in A. fib with RVR and started on Cardizem drip.  Transferred to [facility] for further care given ESRD on PD.  On 5/13, developed intermittent biliary emesis.  CT abdomen and pelvis revealed SBO.  General surgery consulted 5/14.  He underwent ex lap on 5/17.  He was noted to have drainage from abdominal wound incision.  He underwent repeat ex lap, washout and placement of retention sutures on 5/24.  Started TPN via PICC line on 5/18.  P.o. intake improved.  Off TPN on 5/27.  Patient started on HD through right IJ HD cath on 5/18.  Left IJ TDC on 5/19 due right IJ HD cath malfunction. He is now on HD MWF.  He has outpatient dialysis spot.  Cleared for discharge from nephrology and general surgery standpoint. However, he lives alone.  Needs daily dressing. [place] ordered but won't see patient until 5/31.  Will be discharged on 5/30.  Subjective: Seen and examined earlier this morning.  No major events overnight of this morning.  He says he had significant abdominal pain last night.  Pain has improved this morning although he rates his pain 7/10.  He says he has not had bowel movement in 3 days now.  Denies nausea or emesis.   Objective: Vitals:   09/10/19 0515 09/10/19 0735 09/10/19 0855 09/10/19 1027  BP: 129/78 120/76  (!) 123/99  Pulse: 83 86  (!) 107  Resp: 15 18    Temp: 98.6 F (37 C) 98.7 F (37.1 C)  98.4 F (36.9 C)  TempSrc: Oral Oral  Oral  SpO2: 100% 95% 98% 97%  Weight:      Height:        Intake/Output Summary (Last 24 hours) at 09/10/2019 1116 Last data filed at 09/10/2019 0900 Gross per 24 hour  Intake  1616.14 ml  Output 200 ml  Net 1416.14 ml   Filed Weights   09/09/19 0403 09/09/19 0716 09/10/19 0025  Weight: 86.9 kg 86.6 kg 87.8 kg    Examination:  GENERAL: No apparent distress.  Nontoxic. HEENT: MMM.  Vision and hearing grossly intact.  NECK: Supple.  No apparent JVD.  RESP: 100% on 3 L.  No IWOB.  Fair aeration bilaterally. CVS:  RRR. Heart sounds normal.  ABD/GI/GU: BS+. Abd soft, NTND.  Dressing over surgical wound DCI. MSK/EXT:  Moves extremities. No apparent deformity. No edema.  SKIN: Dressing over laparotomy wound DCI. NEURO: Awake, alert and oriented appropriately.  No apparent focal neuro deficit. PSYCH: Calm. Normal affect.  Procedures:  -5/17-ex lap -5/24-ex lap  Microbiology summarized: 5/10-COVID-19 PCR negative 5/10-blood cultures negative  Assessment & Plan: Small bowel obstruction secondary to adhesion: wound appears clean except for mild erythema -Ex lap on 5/17 and 5/24. -TPN 5/18-5/27.  Tolerating renal/carb modified diet now. -Oxycodone as needed for pain.  Discontinued IV morphine. -On IV Ancef for cellulitis wound.  Will discharge on Keflex for 5 more days per surgery recommendation -Cleared by general surgery for discharge but will wait until 5/30 as [place] won't come to his house before 5/31 -Bowel regimen for constipation  History of ESRD on peritoneal dialysis -  Converted to HD MWF through [facility] for 1 to 2 months after surgery (5/17) -Also makes some urine.  -Cleared for discharge from nephrology standpoint. Has outpt HD spot  COPD with acute exacerbation: Improved -Continue bronchodilators and mucolytic's -Wean oxygen as able. -Encourage incentive spirometry -Ambulate for saturation assessment  Chronic A. fib with RVR: RVR resolved. -Continue p.o. metoprolol -Continue Eliquis for anticoagulation  Anemia of renal disease: Relatively stable.  Anemia panel suggests both IDA and ACD.- -Per nephrology  Essential hypertension -Cardiac  meds as above.  Hypokalemia/Hypomagnesemia  -Monitor and replenish  Hyponatremia: In the setting of ESRD.  Stable.  Leukocytosis/bandemia: Resolved. -Antibiotics as above.  Debility/physical deconditioning:  -Home health PT/OT on discharge  Moderate malnutrition Nutrition Problem: Moderate Malnutrition Etiology: chronic illness(COPD, ESRD on PD) Signs/Symptoms: energy intake < 75% for > or equal to 1 month, mild fat depletion, moderate fat depletion, mild muscle depletion, moderate muscle depletion Interventions: TPN, [place]   DVT prophylaxis: On Eliquis for A. fib Code Status: DNR/DNI Family Communication: Patient and/or RN. Available if any question.  Status is: Inpatient  Remains inpatient appropriate because:Unsafe d/c plan and Inpatient level of care appropriate due to severity of illness.    Dispo: The patient is from: Home              Anticipated d/c is to: Home              Anticipated d/c date is: 1 day              Patient currently is not medically stable to d/c.        Consultants:  General surgery Nephrology PMT   Sch Meds:  Scheduled Meds: . apixaban  5 mg Oral BID  . atorvastatin  80 mg Oral Daily  . budesonide (PULMICORT) nebulizer solution  0.25 mg Nebulization BID  . Chlorhexidine Gluconate Cloth  6 each Topical Daily  . darbepoetin (ARANESP) injection - DIALYSIS  60 mcg Intravenous Q Wed-HD  . gabapentin  300 mg Oral QHS  . gentamicin cream  1 application Topical Daily  . guaiFENesin-dextromethorphan  5 mL Oral Q8H  . ipratropium-albuterol  3 mL Nebulization BID  . metoprolol tartrate  25 mg Oral BID  . multivitamin  1 tablet Oral QHS  . pantoprazole  40 mg Oral BID  . potassium chloride  20 mEq Oral BID  . sodium chloride flush  10-40 mL Intracatheter Q12H   Continuous Infusions: . sodium chloride 250 mL (09/09/19 1950)  .  ceFAZolin (ANCEF) IV 1 g (09/09/19 1951)   PRN Meds:.sodium chloride, acetaminophen **OR**  acetaminophen, albuterol, fluticasone furoate-vilanterol, ondansetron **OR** ondansetron (ZOFRAN) IV, oxyCODONE-acetaminophen, sodium chloride flush, zolpidem  Antimicrobials: Anti-infectives (From admission, onward)   Start     Dose/Rate Route Frequency Ordered Stop   09/05/19 1800  ceFAZolin (ANCEF) IVPB 1 g/50 mL premix     1 g 100 mL/hr over 30 Minutes Intravenous Every 24 hours 09/04/19 0939     09/04/19 1030  ceFAZolin (ANCEF) IVPB 1 g/50 mL premix     1 g 100 mL/hr over 30 Minutes Intravenous  Once 09/04/19 0939 09/04/19 1629   08/31/19 1415  ceFAZolin (ANCEF) IVPB 2g/100 mL premix     2 g 200 mL/hr over 30 Minutes Intravenous  Once 08/31/19 1328 08/31/19 1358   08/31/19 1400  ceFAZolin (ANCEF) powder 2 g  Status:  Discontinued     2 g Other To Surgery 08/31/19 1314 08/31/19 1326   08/29/19 1145  ceFAZolin (ANCEF) IVPB 2g/100 mL premix     2 g 200 mL/hr over 30 Minutes Intravenous To ShortStay Surgical 08/29/19 1127 08/29/19 1445   08/23/19 2200  azithromycin (ZITHROMAX) tablet 500 mg  Status:  Discontinued    Note to Pharmacy: Pharmacy to adjust timing for next dose.   500 mg Oral Daily at bedtime 08/23/19 0233 08/25/19 2046   08/22/19 2130  azithromycin (ZITHROMAX) 500 mg in sodium chloride 0.9 % 250 mL IVPB     500 mg 250 mL/hr over 60 Minutes Intravenous  Once 08/22/19 2122 08/22/19 2245       I have personally reviewed the following labs and images: CBC: Recent Labs  Lab 09/05/19 0511 09/06/19 0523 09/07/19 0322 09/09/19 0500 09/10/19 0615  WBC 22.2* 10.3 11.4* 11.3* 10.4  NEUTROABS 20.1*  --   --   --  8.8*  HGB 11.1* 9.3* 9.7* 9.4* 9.5*  HCT 34.0* 29.0* 30.7* 30.0* 30.5*  MCV 100.3* 100.3* 101.7* 101.0* 101.7*  PLT 219 180 211 214 240   BMP &GFR Recent Labs  Lab 09/05/19 0511 09/05/19 0511 09/06/19 0523 09/07/19 0322 09/08/19 0500 09/09/19 0500 09/10/19 0615  NA 129*   < > 133* 129* 131* 131* 133*  K 5.1   < > 3.3* 3.3* 3.3* 3.9 4.2  CL 93*   < >  100 97* 97* 96* 99  CO2 20*   < > 25 22 26 25 25   GLUCOSE 95   < > 141* 126* 110* 95 109*  BUN 93*   < > 38* 54* 34* 47* 34*  CREATININE 7.10*   < > 4.07* 4.80* 3.48* 4.39* 3.48*  CALCIUM 7.9*   < > 7.0* 7.1* 7.2* 7.2* 7.4*  MG 1.7  --  1.6*  --  1.7 1.6* 1.5*  PHOS 4.6   < > 3.1 3.1 2.5 3.0 2.4*   < > = values in this interval not displayed.   Estimated Creatinine Clearance: 26.7 mL/min (A) (by C-G formula based on SCr of 3.48 mg/dL (H)). Liver & Pancreas: Recent Labs  Lab 09/05/19 0511 09/05/19 0511 09/06/19 0523 09/07/19 0322 09/08/19 0500 09/09/19 0500 09/10/19 0615  AST 14*  --  16  --  19  --   --   ALT 6  --  <5  --  <5  --   --   ALKPHOS 78  --  71  --  81  --   --   BILITOT 0.7  --  0.4  --  0.3  --   --   PROT 5.3*  --  4.8*  --  4.7*  --   --   ALBUMIN 1.8*   < > 1.7* 1.6* 1.5* 1.5* 1.6*   < > = values in this interval not displayed.   No results for input(s): LIPASE, AMYLASE in the last 168 hours. No results for input(s): AMMONIA in the last 168 hours. Diabetic: No results for input(s): HGBA1C in the last 72 hours. Recent Labs  Lab 09/09/19 1603 09/09/19 2109 09/10/19 0026 09/10/19 0518 09/10/19 0731  GLUCAP 95 122* 114* 103* 110*   Cardiac Enzymes: No results for input(s): CKTOTAL, CKMB, CKMBINDEX, TROPONINI in the last 168 hours. No results for input(s): PROBNP in the last 8760 hours. Coagulation Profile: No results for input(s): INR, PROTIME in the last 168 hours. Thyroid Function Tests: No results for input(s): TSH, T4TOTAL, FREET4, T3FREE, THYROIDAB in the last 72 hours. Lipid Profile: No results for input(s): CHOL, HDL, LDLCALC,  TRIG, CHOLHDL, LDLDIRECT in the last 72 hours. Anemia Panel: No results for input(s): VITAMINB12, FOLATE, FERRITIN, TIBC, IRON, RETICCTPCT in the last 72 hours. Urine analysis:    Component Value Date/Time   COLORURINE YELLOW 05/27/2019 0524   APPEARANCEUR CLEAR 05/27/2019 0524   LABSPEC 1.025 05/27/2019 0524    PHURINE 6.0 05/27/2019 0524   GLUCOSEU 150 (A) 05/27/2019 0524   HGBUR MODERATE (A) 05/27/2019 0524   BILIRUBINUR NEGATIVE 05/27/2019 0524   KETONESUR NEGATIVE 05/27/2019 0524   PROTEINUR 100 (A) 05/27/2019 0524   UROBILINOGEN 0.2 03/03/2010 1056   NITRITE NEGATIVE 05/27/2019 0524   LEUKOCYTESUR TRACE (A) 05/27/2019 0524   Sepsis Labs: Invalid input(s): PROCALCITONIN, [place]  Microbiology: No results found for this or any previous visit (from the past 240 hour(s)).  Radiology Studies: No results found.   Chuong Casebeer T. [place]  If 7PM-7AM, please contact night-coverage [email or web address] Password [facility] 09/10/2019, 11:16 AM

## 2019-09-10 NOTE — Progress Notes (Signed)
5 Days Post-Op    CC:  abd pain  Subjective: He is anxious to go home.  He still ask every day what the retention sutures are.  Site looks better with staples out and wet to dry dressings started.    Objective: Vital signs in last 24 hours: Temp:  [97.8 F (36.6 C)-98.7 F (37.1 C)] 98.7 F (37.1 C) (05/29 0735) Pulse Rate:  [82-99] 86 (05/29 0735) Resp:  [11-20] 18 (05/29 0735) BP: (104-129)/(64-102) 120/76 (05/29 0735) SpO2:  [91 %-100 %] 95 % (05/29 0735) Weight:  [87.8 kg] 87.8 kg (05/29 0025) Last BM Date: 09/07/19 1320 PO 200 urine recorded  2000 HD Afebrile, VSS WBC 10.4 BMP stable  Intake/Output from previous day: 05/28 0701 - 05/29 0700 In: 1376.1 [P.O.:1320; I.V.:6.1; IV Piggyback:50] Out: 2200 [Urine:200] Intake/Output this shift: No intake/output data recorded.  General appearance: alert, cooperative, no distress and says it is still sore.   GI: mildly distended, tolerating diet.  Midline wound looks better, less edema and erythema.         Lab Results:  Recent Labs    09/09/19 0500 09/10/19 0615  WBC 11.3* 10.4  HGB 9.4* 9.5*  HCT 30.0* 30.5*  PLT 214 240    BMET Recent Labs    09/09/19 0500 09/10/19 0615  NA 131* 133*  K 3.9 4.2  CL 96* 99  CO2 25 25  GLUCOSE 95 109*  BUN 47* 34*  CREATININE 4.39* 3.48*  CALCIUM 7.2* 7.4*   PT/INR No results for input(s): LABPROT, INR in the last 72 hours.  Recent Labs  Lab 09/05/19 0511 09/05/19 0511 09/06/19 0523 09/07/19 0322 09/08/19 0500 09/09/19 0500 09/10/19 0615  AST 14*  --  16  --  19  --   --   ALT 6  --  <5  --  <5  --   --   ALKPHOS 78  --  71  --  81  --   --   BILITOT 0.7  --  0.4  --  0.3  --   --   PROT 5.3*  --  4.8*  --  4.7*  --   --   ALBUMIN 1.8*   < > 1.7* 1.6* 1.5* 1.5* 1.6*   < > = values in this interval not displayed.     Lipase     Component Value Date/Time   LIPASE 23 08/25/2019 1740     Medications: . apixaban  5 mg Oral BID  . atorvastatin  80  mg Oral Daily  . budesonide (PULMICORT) nebulizer solution  0.25 mg Nebulization BID  . Chlorhexidine Gluconate Cloth  6 each Topical Daily  . darbepoetin (ARANESP) injection - DIALYSIS  60 mcg Intravenous Q Wed-HD  . gabapentin  300 mg Oral QHS  . gentamicin cream  1 application Topical Daily  . guaiFENesin-dextromethorphan  5 mL Oral Q8H  . ipratropium-albuterol  3 mL Nebulization BID  . metoprolol tartrate  25 mg Oral BID  . multivitamin  1 tablet Oral QHS  . pantoprazole  40 mg Oral BID  . potassium chloride  20 mEq Oral BID  . sodium chloride flush  10-40 mL Intracatheter Q12H    Assessment/Plan CHF COPD A fib  Polycystic kidney disease, ESRD, on PD - converted to HD temporarily H/O MI - plavix on hold as of 5/13 CAD Hep C HLD Hypomagnesemia  SBO Exploratory laparotomy, LOA, repair of SB serosal injury x3, resection of mesenteric nodule 08/29/19 Dr. Bobbye Morton  POD# 12 Exploratory laparotomy, washout, abdominal wall closure with placement of retention sutures for fascial dehiscence5/24/21 Dr. Georgette Dover POD# 5 - AFVSS, WBC 10.5>>15.7>>22.2>>10.3>>11.4>>11.3>>10.4 - OOB/mobilize, PT/OT - IS 10x q 1h   - staples removed 5/28 >> wet to dry dressing and binder  FEN - carb mod/renal diet/ TPN - OK to wean VTE - SCDs,Eliquis ID -Ancef for cellulitis 5/23 >>day 6   Plan:  Wet to dry dressing, leave retention sutures in for weeks.  He can go home from our standpoint when OK with Medicine and arrangement are complete.  I would keep him on Antibiotics for another 5 days.    He has follow up with DR. Lovick for 09/29/19 in [facility].       LOS: 19 days    Nykayla Marcelli 09/10/2019 Please see Amion

## 2019-09-10 NOTE — Progress Notes (Signed)
[facility] Progress Note     Assessment/ Plan:   1. SOB/COPD/pulmonary edema-Felt to be multifactorial - COPD/ Afib with rapid rate and volume overload. Converted to HD for ultrafiltration.  S/p Steroids/inhalers/azithro.  Improved   2. ESRD-secondary to polycystic kidney disease. Has been on PD for 6 mos. followed in [place]. Now converted to HD in light of major abd surgery but PD catheter remains in place. Appreciate the [facility]J TC 5/19 by VIR.  Tolerated hemodialysis yesterday with 2 L ultrafiltration.  Increasing ultrafiltration as able.   Has spot at [facility] 2nd shift (MWF regimen) 1145AM.  Not clear how feasible transition back to PD will be but he would really prefer that if possible - will need to address with surgeon at outpt f/u.  We are flushing his peritoneal dialysis catheter once a week permitted by surgery.  3. Anemia- Hb 9s, started low dose ESA.  Iron sat low at 10%, will give IV iron after antibiotic course complete given cellulitis seemed quite significant.  Unclear when course will be over  4. Secondary hyperparathyroidism -l previously with hyperphosphatemia and now with phosphorus on the lower side.  Holding any binders. 5. HTN/volume-continue carvedilol and diltiazem for atrial fibrillation.  6. Leukocytosis:much improved from 22 to normal range after abd washout + ancef for cellulitis.  PD fluid had been negative for infection earlier in hospitalization 7. Partial small bowel obstruction: Better now, tolerating PO. Surgery following and appreciate assistance; dense adhesions on 5/17. Back to OR earlier this week for washout and retention sutures - seems to be ok for now.  Diet advancing and now off tpn 8. Hyperdense kidney cyst: Consider further evaluation with MRI in the future.  [place] for d/c from renal perspective.    Subjective:   Patient states he ripped some of his sutures last night.  He has some belly pain but otherwise denies  any complaints.  Continues to pass gas without issue.    Objective:   BP 125/90 (BP Location: Right Arm)   Pulse 97   Temp 98.7 F (37.1 C) (Oral)   Resp 18   Ht 6\' 4"  (1.93 m)   Wt 87.8 kg   SpO2 99%   BMI 23.56 kg/m   Intake/Output Summary (Last 24 hours) at 09/10/2019 1317 Last data filed at 09/10/2019 0900 Gross per 24 hour  Intake 1616.14 ml  Output 200 ml  Net 1416.14 ml   Weight change: -0.309 kg  Physical Exam: General:Sitting in bed, no distress Heart:Normal rate Lungs:Bilateral chest rise, no increased work of breathing Abdomen:Distended with abdominal binder in place Extremities: Trace edema Dialysis Access: PD cath,LIJ TC 5/19 Access: Lt arm PICC  Imaging: No results found.  Labs: BMET Recent Labs  Lab 09/05/19 0511 09/06/19 0523 09/07/19 0322 09/08/19 0500 09/09/19 0500 09/10/19 0615  NA 129* 133* 129* 131* 131* 133*  K 5.1 3.3* 3.3* 3.3* 3.9 4.2  CL 93* 100 97* 97* 96* 99  CO2 20* 25 22 26 25 25   GLUCOSE 95 141* 126* 110* 95 109*  BUN 93* 38* 54* 34* 47* 34*  CREATININE 7.10* 4.07* 4.80* 3.48* 4.39* 3.48*  CALCIUM 7.9* 7.0* 7.1* 7.2* 7.2* 7.4*  PHOS 4.6 3.1 3.1 2.5 3.0 2.4*   CBC Recent Labs  Lab 09/05/19 0511 09/05/19 0511 09/06/19 0523 09/07/19 0322 09/09/19 0500 09/10/19 0615  WBC 22.2*   < > 10.3 11.4* 11.3* 10.4  NEUTROABS 20.1*  --   --   --   --  8.8*  HGB 11.1*   < > 9.3* 9.7* 9.4* 9.5*  HCT 34.0*   < > 29.0* 30.7* 30.0* 30.5*  MCV 100.3*   < > 100.3* 101.7* 101.0* 101.7*  PLT 219   < > 180 211 214 240   < > = values in this interval not displayed.    Medications:    . apixaban  5 mg Oral BID  . atorvastatin  80 mg Oral Daily  . budesonide (PULMICORT) nebulizer solution  0.25 mg Nebulization BID  . Chlorhexidine Gluconate Cloth  6 each Topical Daily  . darbepoetin (ARANESP) injection - DIALYSIS  60 mcg Intravenous Q Wed-HD  . docusate sodium  100 mg Oral Daily  . gabapentin  300 mg Oral QHS  . gentamicin cream   1 application Topical Daily  . guaiFENesin-dextromethorphan  5 mL Oral Q8H  . ipratropium-albuterol  3 mL Nebulization BID  . metoprolol tartrate  25 mg Oral BID  . multivitamin  1 tablet Oral QHS  . pantoprazole  40 mg Oral BID  . potassium chloride  20 mEq Oral BID  . sodium chloride flush  10-40 mL Intracatheter Q12H

## 2019-09-11 LAB — RENAL FUNCTION PANEL
Albumin: 1.4 g/dL — ABNORMAL LOW (ref 3.5–5.0)
Anion gap: 9 (ref 5–15)
BUN: 42 mg/dL — ABNORMAL HIGH (ref 8–23)
CO2: 24 mmol/L (ref 22–32)
Calcium: 7.5 mg/dL — ABNORMAL LOW (ref 8.9–10.3)
Chloride: 98 mmol/L (ref 98–111)
Creatinine, Ser: 4.52 mg/dL — ABNORMAL HIGH (ref 0.61–1.24)
GFR calc Af Amer: 15 mL/min — ABNORMAL LOW (ref >60)
GFR calc non Af Amer: 13 mL/min — ABNORMAL LOW (ref >60)
Glucose, Bld: 101 mg/dL — ABNORMAL HIGH (ref 70–99)
Phosphorus: 2.9 mg/dL (ref 2.5–4.6)
Potassium: 4.7 mmol/L (ref 3.5–5.1)
Sodium: 131 mmol/L — ABNORMAL LOW (ref 135–145)

## 2019-09-11 LAB — MAGNESIUM: Magnesium: 2.4 mg/dL (ref 1.7–2.4)

## 2019-09-11 LAB — GLUCOSE, CAPILLARY
Glucose-Capillary: 104 mg/dL — ABNORMAL HIGH (ref 70–99)
Glucose-Capillary: 108 mg/dL — ABNORMAL HIGH (ref 70–99)

## 2019-09-11 MED ORDER — SENNOSIDES-DOCUSATE SODIUM 8.6-50 MG PO TABS: 1.0000 | ORAL_TABLET | Freq: Two times a day (BID) | ORAL | Status: AC | PRN

## 2019-09-11 MED ORDER — POLYETHYLENE GLYCOL 3350 17 G PO PACK
17.0000 g | PACK | Freq: Every day | ORAL | Status: DC
  Administered 2019-09-11: 17 g via ORAL
  Filled 2019-09-11: qty 1

## 2019-09-11 MED ORDER — METOPROLOL TARTRATE 25 MG PO TABS: 25.0000 mg | ORAL_TABLET | Freq: Two times a day (BID) | ORAL | 1 refills | Status: DC

## 2019-09-11 MED ORDER — FUROSEMIDE 40 MG PO TABS: 40.0000 mg | ORAL_TABLET | Freq: Every day | ORAL | 1 refills | Status: DC | PRN

## 2019-09-11 MED ORDER — APIXABAN 5 MG PO TABS: 5.0000 mg | ORAL_TABLET | Freq: Two times a day (BID) | ORAL | 1 refills | Status: AC

## 2019-09-11 MED ORDER — DARBEPOETIN ALFA 60 MCG/0.3ML IJ SOSY: 60.0000 ug | PREFILLED_SYRINGE | INTRAMUSCULAR | Status: AC

## 2019-09-11 MED ORDER — ONDANSETRON HCL 4 MG PO TABS: 4.0000 mg | ORAL_TABLET | Freq: Three times a day (TID) | ORAL | 0 refills | Status: AC | PRN

## 2019-09-11 MED ORDER — DOCUSATE SODIUM 100 MG PO CAPS: 100.0000 mg | ORAL_CAPSULE | Freq: Every day | ORAL | 0 refills | Status: AC

## 2019-09-11 MED ORDER — CEPHALEXIN 250 MG PO CAPS: 250.0000 mg | ORAL_CAPSULE | Freq: Every day | ORAL | 0 refills | Status: AC

## 2019-09-11 MED ORDER — RENA-VITE PO TABS: 1.0000 | ORAL_TABLET | Freq: Every day | ORAL | 1 refills | Status: AC

## 2019-09-11 NOTE — Discharge Summary (Signed)
Physician Discharge Summary  Aaron [name] ZOX:[id number] DOB: Jun 27, 1955 DOA: 08/22/2019  PCP: [facility] [place] date: 08/22/2019 Discharge date: 09/11/2019  Admitted From: Home Disposition: Home  Recommendations for Outpatient Follow-up:  1. Follow ups as below. 2. Please obtain CBC/BMP/Mag at follow up 3. Please follow up on the following pending results: None  Home Health: PT/OT/RN/aide/CSW Equipment/Devices: None  Discharge Condition: Stable CODE STATUS: Full code  Follow-up Information    Jesusita Oka, MD. Go on 09/29/2019.   Specialty: Surgery Why: 945am. Please arrive 30 minutes prior to your appointment for paperwork. Please bring a copy of your photo ID and insurance card to the appointment.  Contact information: [address] [place] [address] [phone]        Lodema Pilot, PA-C.   Specialty: Physician Assistant Why: GO: JUNE 9 AT 9:15AM  Contact information: [place] [address] [phone]        [facility], [facility]. Schedule an appointment as soon as possible for a visit in 1 week(s).   Contact information: [address] [place] [address] [phone]            Hospital Course: 64 year old male with history of A. fib on Eliquis, ESRD on PD, COPD, former smoker, HTN and HLD presented to [facility] ED with progressive shortness of breath.  In ED, found to be in A. fib with RVR and started on Cardizem drip.  Transferred to [facility] for further care given ESRD on PD.  On 5/13, developed intermittent biliary emesis.  CT abdomen and pelvis revealed SBO.  General surgery consulted 5/14.  He underwent ex lap on 5/17.  He was noted to have drainage from abdominal wound incision.  He underwent repeat ex lap, washout and placement of retention sutures on 5/24.  Started TPN via PICC line on 5/18.  P.o. intake improved.  Off TPN on 5/27.  Patient started on HD  through right IJ HD cath on 5/18.  Left IJ TDC on 5/19 due right IJ HD cath malfunction. Patient is now on HD MWF.  He has outpatient dialysis spot.    Patient cleared for discharge from nephrology and general surgery standpoint. Needs daily dressing. HH ordered.  Keflex for 6 more days.  Outpatient follow-up with general surgery as above.  See individual problem list below for more hospital course.  Discharge Diagnoses:  Small bowel obstruction secondary to adhesion: wound appears clean except for mild erythema -Ex lap on 5/17 and 5/24. -TPN 5/18-5/27.  Tolerating renal/carb modified diet now. -Oxycodone as needed for pain.  Discontinued IV morphine. -Was on IV Ancef for cellulitic skin. Discharged on Keflex for 6 more days -Follow-up with general surgery as above -Bowel regimen for constipation  History of ESRD on peritoneal dialysis -Converted to HD MWF through [facility] for 1 to 2 months after surgery (5/17) -Also makes some urine.   COPD with acute exacerbation: Exacerbation resolved.  Ambulated on room air and maintained appropriate saturation on the day of discharge. -Continue home medications  Chronic A. fib with RVR: RVR resolved. -Continue p.o. metoprolol and Eliquis -Did not require Cardizem  Anemia of renal disease: Relatively stable.  Anemia panel suggests both IDA and ACD. -Per nephrology -Check CBC at follow-up.  Essential hypertension -Discharged on low-dose metoprolol.   -Home Coreg, Cardizem, lisinopril, Imdur and hydralazine discontinued as he did not need them throughout his hospitalization.  Hypokalemia/Hypomagnesemia: Resolved.  Hyponatremia: In the setting  of ESRD.  Stable.  Leukocytosis/bandemia: Resolved.  Debility/physical deconditioning:  -Home health PT/OT  Moderate malnutrition  Nutrition Problem: Moderate Malnutrition Etiology: chronic illness(COPD, ESRD on PD)  Signs/Symptoms: energy intake < 75% for > or equal to 1 month, mild fat  depletion, moderate fat depletion, mild muscle depletion, moderate muscle depletion  Interventions: TPN, Boost Breeze      Discharge Exam: Vitals:   09/11/19 0412 09/11/19 0725  BP: 118/79 (!) 119/97  Pulse: 86 87  Resp: 18 18  Temp: 98.8 F (37.1 C) 98 F (36.7 C)  SpO2: 100% 98%    GENERAL: No apparent distress.  Nontoxic. HEENT: MMM.  Vision and hearing grossly intact.  NECK: Supple.  No apparent JVD.  RESP: On room air.  No IWOB.  Fair aeration bilaterally. CVS:  RRR. Heart sounds normal.  ABD/GI/GU: Bowel sounds present. Soft.  Dressing over surgical wound DCI. MSK/EXT:  Moves extremities. No apparent deformity. No edema.  SKIN: Laparotomy wound with retention sutures in place.  Mild surrounding skin erythema. NEURO: Awake, alert and oriented appropriately.  No apparent focal neuro deficit. PSYCH: Calm. Normal affect.      Discharge Instructions  Discharge Instructions    Call MD for:  difficulty breathing, headache or visual disturbances   Complete by: As directed    Call MD for:  extreme fatigue   Complete by: As directed    Call MD for:  persistant dizziness or light-headedness   Complete by: As directed    Call MD for:  persistant nausea and vomiting   Complete by: As directed    Call MD for:  redness, tenderness, or signs of infection (pain, swelling, redness, odor or green/yellow discharge around incision site)   Complete by: As directed    Call MD for:  severe uncontrolled pain   Complete by: As directed    Call MD for:  temperature >100.4   Complete by: As directed    Diet - low sodium heart healthy   Complete by: As directed    Discharge instructions   Complete by: As directed    It has been a pleasure taking care of you! You were hospitalized due to small bowel obstruction.  You were treated surgically medically.  You will have the nurse come by to do daily dressing for the wound.  You are also being discharged on antibiotics to complete treatment  course.  Carefully review your new medication list and the instructions on how to take them as there could be some changes during this hospitalization.  Contact your primary care doctor for refills on your medications.  Please call to schedule or go to your hospital follow-up appointments.   Take care,   Increase activity slowly   Complete by: As directed      Allergies as of 09/11/2019   No Known Allergies     Medication List    STOP taking these medications   carvedilol 25 MG tablet Commonly known as: COREG   clopidogrel 75 MG tablet Commonly known as: PLAVIX   diltiazem 60 MG tablet Commonly known as: CARDIZEM   hydrALAZINE 25 MG tablet Commonly known as: APRESOLINE   isosorbide mononitrate 60 MG 24 hr tablet Commonly known as: IMDUR   lisinopril 20 MG tablet Commonly known as: ZESTRIL   sodium bicarbonate 650 MG tablet     TAKE these medications   albuterol 108 (90 Base) MCG/ACT inhaler Commonly known as: VENTOLIN HFA Inhale 2 puffs into the lungs every 6 (six) hours  as needed for wheezing or shortness of breath.   albuterol (2.5 MG/3ML) 0.083% nebulizer solution Commonly known as: PROVENTIL Inhale 3 mLs into the lungs 3 (three) times daily as needed for shortness of breath or wheezing.   apixaban 5 MG Tabs tablet Commonly known as: ELIQUIS Take 1 tablet (5 mg total) by mouth 2 (two) times daily. What changed:   medication strength  how much to take   atorvastatin 80 MG tablet Commonly known as: LIPITOR Take 80 mg by mouth daily.   Breo Ellipta 200-25 MCG/INH Aepb Generic drug: fluticasone furoate-vilanterol Inhale 1 puff into the lungs daily as needed (for respiratory issues.).   calcium acetate 667 MG capsule Commonly known as: PHOSLO Take 667 mg by mouth 3 (three) times daily with meals.   cephALEXin 250 MG capsule Commonly known as: KEFLEX Take 1 capsule (250 mg total) by mouth daily for 7 doses.   Darbepoetin Alfa 60 MCG/0.3ML Sosy  injection Commonly known as: ARANESP Inject 0.3 mLs (60 mcg total) into the vein every Wednesday with hemodialysis. Start taking on: September 14, 2019   docusate sodium 100 MG capsule Commonly known as: COLACE Take 1 capsule (100 mg total) by mouth daily.   furosemide 40 MG tablet Commonly known as: LASIX Take 1 tablet (40 mg total) by mouth daily as needed for fluid or edema (Shortness of breath). What changed:   when to take this  reasons to take this   gabapentin 300 MG capsule Commonly known as: NEURONTIN Take 300 mg by mouth 2 (two) times daily.   metoprolol tartrate 25 MG tablet Commonly known as: LOPRESSOR Take 1 tablet (25 mg total) by mouth 2 (two) times daily.   multivitamin Tabs tablet Take 1 tablet by mouth at bedtime.   ondansetron 4 MG tablet Commonly known as: ZOFRAN Take 1 tablet (4 mg total) by mouth every 8 (eight) hours as needed for nausea.   oxyCODONE-acetaminophen 10-325 MG tablet Commonly known as: PERCOCET Take 1 tablet by mouth 5 (five) times daily as needed for pain.   pantoprazole 40 MG tablet Commonly known as: PROTONIX Take 1 tablet (40 mg total) by mouth 2 (two) times daily. What changed: when to take this   senna-docusate 8.6-50 MG tablet Commonly known as: Senokot-S Take 1 tablet by mouth 2 (two) times daily as needed for moderate constipation.   varenicline 0.5 MG tablet Commonly known as: CHANTIX Take 0.5 mg by mouth 2 (two) times daily.       Consultations:  General surgery  Nephrology  Procedures/Studies: -5/17-ex lap -5/24-ex lap   DG Abd 1 View  Result Date: 08/26/2019 CLINICAL DATA:  Small bowel obstruction. EXAM: ABDOMEN - 1 VIEW COMPARISON:  CT abdomen pelvis from yesterday. FINDINGS: Enteric tube in the stomach. Peritoneal dialysis catheter in the pelvis. Multiple dilated loops of small bowel measuring up to 5.0 cm in diameter, progressed from yesterday's CT. No radio-opaque calculi or other significant  radiographic abnormality are seen. No acute osseous abnormality. Right iliac artery stents. IMPRESSION: 1. Mildly worsened small bowel obstruction. Electronically Signed   By: Titus Dubin M.D.   On: 08/26/2019 12:01   CT ABDOMEN PELVIS W CONTRAST  Addendum Date: 09/03/2019   ADDENDUM REPORT: 09/03/2019 18:02 ADDENDUM: It should be noted that upon further evaluation, patient has a surgical incision site along the midline of the abdomen. A mild amount of subcutaneous inflammatory fat stranding is seen without evidence of an organized fluid collection or abscess. A few small foci of air  density is also seen within the subcutaneous fat. A mild amount of intra-abdominal free air is seen consistent with the patient's recent abdominal surgical intervention. Results were discussed with Dr. Tyrell Antonio at 5:59 p.m. [place] on Sep 03, 2019. Electronically Signed   By: Virgina Norfolk M.D.   On: 09/03/2019 18:02   Result Date: 09/03/2019 CLINICAL DATA:  Evaluate for small bowel obstruction. EXAM: CT ABDOMEN AND PELVIS WITH CONTRAST TECHNIQUE: Multidetector CT imaging of the abdomen and pelvis was performed using the standard protocol following bolus administration of intravenous contrast. CONTRAST:  136m OMNIPAQUE IOHEXOL 300 MG/ML  SOLN COMPARISON:  Aug 25, 2019 FINDINGS: Lower chest: Mild atelectasis and/or early infiltrate is seen within the posterior aspect of the right lung base. There is a small anterior pericardial effusion. Hepatobiliary: Innumerable subcentimeter cysts are seen scattered throughout the liver parenchyma. No gallstones, gallbladder wall thickening, or biliary dilatation. Pancreas: Unremarkable. No pancreatic ductal dilatation or surrounding inflammatory changes. Spleen: Normal in size without focal abnormality. Adrenals/Urinary Tract: Adrenal glands are unremarkable. Innumerable cysts of various sizes are seen within both kidneys. A 5.7 cm x 4.6 cm well-defined heterogeneous mildly increased  attenuation lesion is seen within the anteromedial aspect of the mid left kidney. A mild amount of air is seen within the lumen of an otherwise normal appearing urinary bladder. Stomach/Bowel: Stomach is within normal limits. The appendix is not clearly identified. Numerous dilated small bowel loops are seen throughout the mid and lower left abdomen (maximum small bowel diameter of approximately 4.4 cm). A transition zone is seen within the left lower quadrant. Noninflamed diverticula are seen throughout the large bowel. Vascular/Lymphatic: There is marked severity aortic calcification. No enlarged abdominal or pelvic lymph nodes. Reproductive: The prostate gland is mildly enlarged. Other: The distal portion of a ventriculoperitoneal shunt is seen with its distal tip noted within the pelvis. Musculoskeletal: Marked severity multilevel degenerative changes seen throughout the lumbar spine. Additional findings suggestive of prior discitis/osteomyelitis is seen at the level of L1-L2. IMPRESSION: 1. Findings consistent with a partial small bowel obstruction with a transition zone seen within the left lower quadrant. 2. Innumerable hepatic and renal cysts consistent with polycystic kidney disease. 3. Colonic diverticulosis. 4. Mild atelectasis and/or early infiltrate within the posterior aspect of the right lung base. Aortic Atherosclerosis (ICD10-I70.0). Electronically Signed: By: TVirgina NorfolkM.D. On: 09/03/2019 17:55   CT ABDOMEN PELVIS W CONTRAST  Result Date: 08/25/2019 CLINICAL DATA:  Nausea, vomiting. EXAM: CT ABDOMEN AND PELVIS WITH CONTRAST TECHNIQUE: Multidetector CT imaging of the abdomen and pelvis was performed using the standard protocol following bolus administration of intravenous contrast. CONTRAST:  102mOMNIPAQUE IOHEXOL 300 MG/ML  SOLN COMPARISON:  November 27, 2018. FINDINGS: Lower chest: Small left pleural effusion is noted with adjacent subsegmental atelectasis. Hepatobiliary: No gallstones  or biliary dilatation is noted. Multiple cysts are again noted in the patent parenchyma. Pancreas: Unremarkable. No pancreatic ductal dilatation or surrounding inflammatory changes. Spleen: Normal in size without focal abnormality. Adrenals/Urinary Tract: Adrenal glands appear normal. Continued presence of findings consistent with severe polycystic kidney disease. No hydronephrosis or renal obstruction is noted. Urinary bladder is unremarkable. 5.4 x 5.1 cm hyperdense abnormality is seen involving the anterior aspect of the superior pole of the left kidney which is enlarged compared to prior exam. This may represent enlarging hyperdense cyst, but possible neoplasm cannot be excluded. Further evaluation with MRI or ultrasound is recommended. Stomach/Bowel: Mild gastric distention is noted. No colonic obstruction is noted. The appendix is not visualized.  Proximal small bowel dilatation is noted with possible transition zone seen in the left upper quadrant best seen on image number 41 of series 3. More distal small bowel is not dilated. This is concerning for possible small bowel obstruction, potentially due to adhesion. Vascular/Lymphatic: Aortic atherosclerosis. No enlarged abdominal or pelvic lymph nodes. Reproductive: Prostate is unremarkable. Other: Distal tip of peritoneal dialysis catheter is noted anteriorly within the pelvis. Mild amount of free fluid is noted in the more posterior and dependent portion of the pelvis. Musculoskeletal: Extensive postsurgical and degenerative changes are seen involving the lumbar spine. No acute abnormality is noted. IMPRESSION: 1. Continued presence of findings consistent with severe polycystic kidney disease. 5.4 x 5.1 cm hyperdense abnormality is seen involving anterior aspect of superior pole of left kidney which is enlarged compared to prior exam. This may represent enlarging hyperdense cyst, but possible neoplasm cannot be excluded. Further evaluation with MRI or  ultrasound is recommended. 2. Proximal small bowel dilatation is noted with possible transition zone seen in the left upper quadrant best seen on image number 41 of series 3. More distal small bowel is not dilated. This is concerning for possible small bowel obstruction, potentially due to adhesion. 3. Mild amount of free fluid is noted in the more posterior and dependent portion of the pelvis. 4. Small left pleural effusion is noted with adjacent subsegmental atelectasis. Aortic Atherosclerosis (ICD10-I70.0). Electronically Signed   By: Marijo Conception M.D.   On: 08/25/2019 20:28   IR Fluoro Guide CV Line Left  Result Date: 08/31/2019 INDICATION: 64 year old with end-stage renal disease. Patient currently has a non tunneled right jugular catheter and needs a new tunneled dialysis catheter. The right jugular catheter was difficult to place and plan for placement of a left jugular tunneled dialysis catheter. EXAM: FLUOROSCOPIC AND ULTRASOUND GUIDED PLACEMENT OF A TUNNELED DIALYSIS CATHETER Physician: Stephan Minister. Anselm Pancoast, MD MEDICATIONS: Ancef 2 g; The antibiotic was administered within an appropriate time interval prior to skin puncture. ANESTHESIA/SEDATION: Versed 2.0 mg IV; Fentanyl 100 mcg IV; Moderate Sedation Time:  16 minutes The patient was continuously monitored during the procedure by the interventional radiology nurse under my direct supervision. FLUOROSCOPY TIME:  Fluoroscopy Time: 30 seconds, 5.3 mGy COMPLICATIONS: None immediate. PROCEDURE: The procedure was explained to the patient. The risks and benefits of the procedure were discussed and the patient's questions were addressed. Informed consent was obtained from the patient. The patient was placed supine on the interventional table. Ultrasound confirmed a patent left internal jugular vein. Ultrasound images were obtained for documentation. The left neck and chest was prepped and draped in a sterile fashion. The left neck was anesthetized with 1%  lidocaine. Maximal barrier sterile technique was utilized including caps, mask, sterile gowns, sterile gloves, sterile drape, hand hygiene and skin antiseptic. A small incision was made with #11 blade scalpel. A 21 gauge needle directed into the left internal jugular vein with ultrasound guidance. A micropuncture dilator set was placed. A 28 cm tip to cuff Palindrome catheter was selected. The skin below the left clavicle was anesthetized and a small incision was made with an #11 blade scalpel. A subcutaneous tunnel was formed to the vein dermatotomy site. The catheter was brought through the tunnel. The vein dermatotomy site was dilated to accommodate a peel-away sheath. The catheter was placed through the peel-away sheath and directed into the central venous structures. The tip of the catheter was placed in the right atrium with fluoroscopy. Fluoroscopic images were obtained for documentation. Both  lumens were found to aspirate and flush well. The proper amount of heparin was flushed in both lumens. The vein dermatotomy site was closed using a single layer of absorbable suture and Dermabond. Gel-Foam was placed in the subcutaneous tunnel. The catheter was secured to the skin using Prolene suture. Right jugular catheter was removed at the end of the procedure. IMPRESSION: Successful placement of a left jugular tunneled dialysis catheter using ultrasound and fluoroscopic guidance. Electronically Signed   By: Markus Daft M.D.   On: 08/31/2019 15:29   IR Fluoro Guide CV Line Right  Result Date: 08/30/2019 INDICATION: 64 year old male with a history of chronic renal disease referred for hemodialysis catheter placement. EXAM: IMAGE GUIDED PLACEMENT OF TEMPORARY HEMODIALYSIS CATHETER MEDICATIONS: None ANESTHESIA/SEDATION: None FLUOROSCOPY TIME:  Fluoroscopy Time: 5 minutes 42 seconds (40 mGy). COMPLICATIONS: None PROCEDURE: Informed written consent was obtained from the patient after a discussion of the risks,  benefits, and alternatives to treatment. Questions regarding the procedure were encouraged and answered. The right neck was prepped with chlorhexidine in a sterile fashion, and a sterile drape was applied covering the operative field. Maximum barrier sterile technique with sterile gowns and gloves were used for the procedure. A timeout was performed prior to the initiation of the procedure. Ultrasound was performed demonstrating patent right internal jugular vein. A micropuncture kit was utilized to access the right internal jugular vein under direct, real-time ultrasound guidance after the overlying soft tissues were anesthetized with 1% lidocaine with epinephrine. Ultrasound image documentation was performed. With the initial needle placement in the supraclavicular region, the microwire would not pass centrally. Limited venogram was performed with contrast. This demonstrated scarring and small caliber of the vein, with patency of the SVC. Needle was withdrawn. A second access site more cephalad was selected. 1% lidocaine was used for local anesthesia. Micropuncture needle was used to access the right internal jugular vein. The Nitrex microwire was navigated into a position in the low internal jugular vein. The microwire would not cross into the SVC. The micro puncture kit was advanced over the wire into the low IJ. The microwire and the inner dilator were removed. A standard Glidewire was then used to navigate into the subclavian vein. Angled catheter was then placed on the Glidewire, and using an angled catheter and the Glidewire we were able to successfully navigate the scarred region of the low internal jugular vein. The Glidewire was then used to make a measurement for the length of the catheter. A 20 cm temp cath was selected. Guidewire was advanced to the level of the right atrium. A 20 cm hemodialysis catheter was then placed over the wire. Final catheter positioning was confirmed and documented with a spot  radiographic image. The catheter aspirates and flushes normally. The catheter was flushed with appropriate volume heparin dwells. Dressings were applied. The patient tolerated the procedure well without immediate post procedural complication. 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 A low puncture will not allowed navigation of the scar region. The current temporary hemodialysis catheter cannot be converted. Would advise alternative site if the patient needs a tunneled hemodialysis catheter. IMPRESSION: Status post image guided placement of temporary hemodialysis catheter. Signed, Dulcy Fanny. Dellia Nims, RPVI [facility] Radiology Electronically Signed   By: Corrie Mckusick D.O.   On: 08/30/2019 11:26   IR US Guide Vasc Access Left  Result Date: 08/31/2019 INDICATION: 64 year old with end-stage renal disease. Patient currently has a non tunneled  right jugular catheter and needs a new tunneled dialysis catheter. The right jugular catheter was difficult to place and plan for placement of a left jugular tunneled dialysis catheter. EXAM: FLUOROSCOPIC AND ULTRASOUND GUIDED PLACEMENT OF A TUNNELED DIALYSIS CATHETER Physician: Stephan Minister. Anselm Pancoast, MD MEDICATIONS: Ancef 2 g; The antibiotic was administered within an appropriate time interval prior to skin puncture. ANESTHESIA/SEDATION: Versed 2.0 mg IV; Fentanyl 100 mcg IV; Moderate Sedation Time:  16 minutes The patient was continuously monitored during the procedure by the interventional radiology nurse under my direct supervision. FLUOROSCOPY TIME:  Fluoroscopy Time: 30 seconds, 5.3 mGy COMPLICATIONS: None immediate. PROCEDURE: The procedure was explained to the patient. The risks and benefits of the procedure were discussed and the patient's questions were addressed. Informed consent was obtained from the patient. The patient was placed supine on the interventional table. Ultrasound  confirmed a patent left internal jugular vein. Ultrasound images were obtained for documentation. The left neck and chest was prepped and draped in a sterile fashion. The left neck was anesthetized with 1% lidocaine. Maximal barrier sterile technique was utilized including caps, mask, sterile gowns, sterile gloves, sterile drape, hand hygiene and skin antiseptic. A small incision was made with #11 blade scalpel. A 21 gauge needle directed into the left internal jugular vein with ultrasound guidance. A micropuncture dilator set was placed. A 28 cm tip to cuff Palindrome catheter was selected. The skin below the left clavicle was anesthetized and a small incision was made with an #11 blade scalpel. A subcutaneous tunnel was formed to the vein dermatotomy site. The catheter was brought through the tunnel. The vein dermatotomy site was dilated to accommodate a peel-away sheath. The catheter was placed through the peel-away sheath and directed into the central venous structures. The tip of the catheter was placed in the right atrium with fluoroscopy. Fluoroscopic images were obtained for documentation. Both lumens were found to aspirate and flush well. The proper amount of heparin was flushed in both lumens. The vein dermatotomy site was closed using a single layer of absorbable suture and Dermabond. Gel-Foam was placed in the subcutaneous tunnel. The catheter was secured to the skin using Prolene suture. Right jugular catheter was removed at the end of the procedure. IMPRESSION: Successful placement of a left jugular tunneled dialysis catheter using ultrasound and fluoroscopic guidance. Electronically Signed   By: Markus Daft M.D.   On: 08/31/2019 15:29   IR US Guide Vasc Access Right  Result Date: 08/30/2019 INDICATION: 64 year old male with a history of chronic renal disease referred for hemodialysis catheter placement. EXAM: IMAGE GUIDED PLACEMENT OF TEMPORARY HEMODIALYSIS CATHETER MEDICATIONS: None  ANESTHESIA/SEDATION: None FLUOROSCOPY TIME:  Fluoroscopy Time: 5 minutes 42 seconds (40 mGy). COMPLICATIONS: None PROCEDURE: Informed written consent was obtained from the patient after a discussion of the risks, benefits, and alternatives to treatment. Questions regarding the procedure were encouraged and answered. The right neck was prepped with chlorhexidine in a sterile fashion, and a sterile drape was applied covering the operative field. Maximum barrier sterile technique with sterile gowns and gloves were used for the procedure. A timeout was performed prior to the initiation of the procedure. Ultrasound was performed demonstrating patent right internal jugular vein. A micropuncture kit was utilized to access the right internal jugular vein under direct, real-time ultrasound guidance after the overlying soft tissues were anesthetized with 1% lidocaine with epinephrine. Ultrasound image documentation was performed. With the initial needle placement in the supraclavicular region, the microwire would not pass centrally. Limited venogram was  performed with contrast. This demonstrated scarring and small caliber of the vein, with patency of the SVC. Needle was withdrawn. A second access site more cephalad was selected. 1% lidocaine was used for local anesthesia. Micropuncture needle was used to access the right internal jugular vein. The Nitrex microwire was navigated into a position in the low internal jugular vein. The microwire would not cross into the SVC. The micro puncture kit was advanced over the wire into the low IJ. The microwire and the inner dilator were removed. A standard Glidewire was then used to navigate into the subclavian vein. Angled catheter was then placed on the Glidewire, and using an angled catheter and the Glidewire we were able to successfully navigate the scarred region of the low internal jugular vein. The Glidewire was then used to make a measurement for the length of the catheter. A 20  cm temp cath was selected. Guidewire was advanced to the level of the right atrium. A 20 cm hemodialysis catheter was then placed over the wire. Final catheter positioning was confirmed and documented with a spot radiographic image. The catheter aspirates and flushes normally. The catheter was flushed with appropriate volume heparin dwells. Dressings were applied. The patient tolerated the procedure well without immediate post procedural complication. 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 A low puncture will not allowed navigation of the scar region. The current temporary hemodialysis catheter cannot be converted. Would advise alternative site if the patient needs a tunneled hemodialysis catheter. IMPRESSION: Status post image guided placement of temporary hemodialysis catheter. Signed, Dulcy Fanny. Dellia Nims, RPVI [facility] Electronically Signed   By: Corrie Mckusick D.O.   On: 08/30/2019 11:26   DG CHEST PORT 1 VIEW  Result Date: 09/04/2019 CLINICAL DATA:  Cough. EXAM: PORTABLE CHEST 1 VIEW COMPARISON:  Aug 29, 2019 FINDINGS: Interval placement of large-bore catheter which terminates at the cavoatrial junction. Cardiomediastinal silhouette is normal. Mediastinal contours appear intact. There is no evidence of focal airspace consolidation, pleural effusion or pneumothorax. Mild bibasilar atelectasis. Osseous structures are without acute abnormality. Soft tissues are grossly normal. IMPRESSION: 1. Mild bibasilar atelectasis versus peribronchial airspace consolidation. 2. Interval placement of large-bore central venous catheter which terminates at the cavoatrial junction. Electronically Signed   By: Fidela Salisbury M.D.   On: 09/04/2019 13:15   DG CHEST PORT 1 VIEW  Result Date: 08/29/2019 CLINICAL DATA:  PICC placement EXAM: PORTABLE CHEST 1 VIEW COMPARISON:  Radiograph 08/29/2019 FINDINGS: Transesophageal  tube tip and side port below the GE junction, beyond the margins of imaging. A left upper extremity PICC tip terminates at the superior cavoatrial junction. Telemetry leads overlie the chest. Redemonstration of the patchy right basilar opacity with a background of more chronic coarsened interstitial change in apical lucency likely reflecting emphysematous features prior cross-sectional imaging. No convincing features of edema. No pneumothorax or visible effusion of the left costophrenic sulcus is partially collimated. The aorta is calcified. The remaining cardiomediastinal contours are unremarkable. No acute osseous or soft tissue abnormality. Degenerative changes are present in the imaged spine and shoulders. IMPRESSION: 1. Left upper extremity PICC tip terminates at the superior cavoatrial junction. 2. Transesophageal tube tip and side port below the GE junction, beyond the margins of imaging. 3. Unchanged patchy right basilar opacity. 4. Coarsened interstitial changes and chronic apical lucency compatible with patient's known COPD. Electronically Signed   By: Lovena Le M.D.   On: 08/29/2019 23:02  DG Chest Port 1 View  Result Date: 08/29/2019 CLINICAL DATA:  64 year old male with respiratory failure. EXAM: PORTABLE CHEST 1 VIEW COMPARISON:  Portable chest 08/25/2019 and earlier. FINDINGS: Portable AP semi upright view at 1723 hours. Enteric tube placed, courses to the left upper quadrant with tip not included. No endotracheal tube identified. Mediastinal contours are stable and within normal limits. Evidence of upper lung emphysema on a prior cervical spine CT 01/31/2018, and continued attenuation of upper lobe bronchovascular markings. Minimal patchy right lung base opacity not significantly changed since 08/22/2019. No pneumothorax, pulmonary edema or pleural effusion identified. IMPRESSION: 1. Enteric tube placed into the stomach with tip not included. 2. Emphysema with minimal right lung base opacity  more resembling atelectasis than infection. Electronically Signed   By: Genevie Ann M.D.   On: 08/29/2019 17:34   DG CHEST PORT 1 VIEW  Result Date: 08/25/2019 CLINICAL DATA:  Shortness of breath. EXAM: PORTABLE CHEST 1 VIEW COMPARISON:  Chest x-ray dated Aug 22, 2019. FINDINGS: The heart size and mediastinal contours are within normal limits. Atherosclerotic calcification of the aortic arch. Normal pulmonary vascularity. Unchanged bibasilar atelectasis/scarring. Emphysematous changes again noted. No focal consolidation, pleural effusion, or pneumothorax. No acute osseous abnormality. IMPRESSION: No active disease.  Emphysema. Electronically Signed   By: Titus Dubin M.D.   On: 08/25/2019 11:27   DG Chest Port 1 View  Result Date: 08/22/2019 CLINICAL DATA:  Shortness of breath congestion EXAM: PORTABLE CHEST 1 VIEW COMPARISON:  May 26, 2019 FINDINGS: There is unchanged cardiomegaly. Aortic knob calcifications are seen. There is hyperinflation of the upper lung zones. Ill-defined interstitial reticulonodular opacity seen at the right lung base, more pronounced than on the prior exam. No pleural effusion is seen. No acute osseous abnormality. IMPRESSION: Interstitial reticulonodular opacity more pronounced at the right lung base than on the prior exam which could be due to atelectasis and/or infectious etiology. Findings suggestive of underlying COPD. Electronically Signed   By: Prudencio Pair M.D.   On: 08/22/2019 19:41   DG Abd Portable 1V  Result Date: 08/29/2019 CLINICAL DATA:  Small bowel obstruction. EXAM: PORTABLE ABDOMEN - 1 VIEW COMPARISON:  Aug 27, 2019. FINDINGS: Distal tip of nasogastric tube is seen in expected position of distal stomach. Stable small bowel dilatation is noted concerning for distal small bowel obstruction. IMPRESSION: Distal tip of nasogastric tube is seen in expected position of distal stomach. Electronically Signed   By: Marijo Conception M.D.   On: 08/29/2019 08:50   DG  Abd Portable 1V  Result Date: 08/27/2019 CLINICAL DATA:  Follow-up for small bowel obstruction. EXAM: PORTABLE ABDOMEN - 1 VIEW COMPARISON:  08/26/2019 and CT from 08/25/2019. FINDINGS: There are dilated small bowel loops predominantly in the left abdomen trauma similar to the previous day's study. Nasogastric tube tip projects in the distal stomach. IMPRESSION: 1. Persistent small-bowel dilation consistent with a partial small bowel obstruction, without significant change from the previous day's study. Electronically Signed   By: Lajean Manes M.D.   On: 08/27/2019 13:59   DG Abd Portable 1V-Small Bowel Obstruction Protocol-initial, 8 hr delay  Result Date: 08/26/2019 CLINICAL DATA:  9 hour delay, small-bowel protocol EXAM: PORTABLE ABDOMEN - 1 VIEW COMPARISON:  Radiograph 08/26/2019, CT 08/25/2019 FINDINGS: Multiple stacked air distended loops of small bowel are present. High-density contrast appears largely retained within the distended gastric lumen. Transesophageal tube tip and side port remain within the gastric body. No evidence of free intraperitoneal air. Basilar atelectatic changes. Additional support  devices overlie the chest and abdomen. Foley catheter in place. Degenerative changes in the spine, hips and pelvis. IMPRESSION: Retention of contrast within the distended gastric lumen. Persistently dilated loops of small bowel compatible with obstruction or ileus though the lack of transit is somewhat suggestive of adynamic bowel motility. Electronically Signed   By: Lovena Le M.D.   On: 08/26/2019 21:21   DG Foot Complete Left  Result Date: 08/22/2019 CLINICAL DATA:  [place], great toe pain EXAM: LEFT FOOT - COMPLETE 3+ VIEW COMPARISON:  12/23/2017 FINDINGS: Frontal, oblique, and lateral views of the left foot are obtained. Chronic posttraumatic/postsurgical changes at the first metatarsal. Prior healed second metatarsal fracture unchanged. 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 Diffuse hammertoe deformities are noted. There are no acute displaced fractures. Prominent dorsal soft tissue swelling of the foot. Diffuse vascular calcifications. IMPRESSION: 1. Chronic postsurgical/posttraumatic changes of the first and second metatarsal. 2. Stable severe first metatarsophalangeal joint osteoarthritis. 3. Dorsal soft tissue swelling. 4. No acute fracture. Electronically Signed   By: Randa Ngo M.D.   On: 08/22/2019 19:41   [place] EKG SITE RITE  Result Date: 08/29/2019 If Site Rite image not attached, placement could not be confirmed due to current cardiac rhythm.       The results of significant diagnostics from this hospitalization (including imaging, microbiology, ancillary and laboratory) are listed below for reference.     Microbiology: No results found for this or any previous visit (from the past 240 hour(s)).   Labs: BNP (last 3 results) Recent Labs    12/22/18 1345 05/26/19 1542 08/22/19 1930  BNP 625.0* 322.8* 1,859.0*   Basic Metabolic Panel: Recent Labs  Lab 09/06/19 0523 09/06/19 0523 09/07/19 0322 09/08/19 0500 09/09/19 0500 09/10/19 0615 09/11/19 0500  NA 133*   < > 129* 131* 131* 133* 131*  K 3.3*   < > 3.3* 3.3* 3.9 4.2 4.7  CL 100   < > 97* 97* 96* 99 98  CO2 25   < > '22 26 25 25 24  ' GLUCOSE 141*   < > 126* 110* 95 109* 101*  BUN 38*   < > 54* 34* 47* 34* 42*  CREATININE 4.07*   < > 4.80* 3.48* 4.39* 3.48* 4.52*  CALCIUM 7.0*   < > 7.1* 7.2* 7.2* 7.4* 7.5*  MG 1.6*  --   --  1.7 1.6* 1.5* 2.4  PHOS 3.1   < > 3.1 2.5 3.0 2.4* 2.9   < > = values in this interval not displayed.   Liver Function Tests: Recent Labs  Lab 09/05/19 0511 09/05/19 0511 09/06/19 0523 09/06/19 0523 09/07/19 0322 09/08/19 0500 09/09/19 0500 09/10/19 0615 09/11/19 0500  AST 14*  --  16  --   --  19  --   --   --   ALT 6  --  <5  --   --  <5  --   --   --   ALKPHOS 78  --  71  --   --  81  --   --   --   BILITOT 0.7  --  0.4  --    --  0.3  --   --   --   PROT 5.3*  --  4.8*  --   --  4.7*  --   --   --   ALBUMIN 1.8*   < > 1.7*   < > 1.6* 1.5* 1.5* 1.6* 1.4*   < > =  values in this interval not displayed.   No results for input(s): LIPASE, AMYLASE in the last 168 hours. No results for input(s): AMMONIA in the last 168 hours. CBC: Recent Labs  Lab 09/05/19 0511 09/06/19 0523 09/07/19 0322 09/09/19 0500 09/10/19 0615  WBC 22.2* 10.3 11.4* 11.3* 10.4  NEUTROABS 20.1*  --   --   --  8.8*  HGB 11.1* 9.3* 9.7* 9.4* 9.5*  HCT 34.0* 29.0* 30.7* 30.0* 30.5*  MCV 100.3* 100.3* 101.7* 101.0* 101.7*  PLT 219 180 211 214 240   Cardiac Enzymes: No results for input(s): CKTOTAL, CKMB, CKMBINDEX, TROPONINI in the last 168 hours. BNP: Invalid input(s): POCBNP CBG: Recent Labs  Lab 09/10/19 0731 09/10/19 1146 09/10/19 1649 09/10/19 2011 09/11/19 0413  GLUCAP 110* 118* 141* 91 104*   D-Dimer No results for input(s): DDIMER in the last 72 hours. Hgb A1c No results for input(s): HGBA1C in the last 72 hours. Lipid Profile No results for input(s): CHOL, HDL, LDLCALC, TRIG, CHOLHDL, LDLDIRECT in the last 72 hours. Thyroid function studies No results for input(s): TSH, T4TOTAL, T3FREE, THYROIDAB in the last 72 hours.  Invalid input(s): FREET3 Anemia work up No results for input(s): VITAMINB12, FOLATE, FERRITIN, TIBC, IRON, RETICCTPCT in the last 72 hours. Urinalysis    Component Value Date/Time   COLORURINE YELLOW 05/27/2019 0524   APPEARANCEUR CLEAR 05/27/2019 0524   LABSPEC 1.025 05/27/2019 0524   PHURINE 6.0 05/27/2019 0524   GLUCOSEU 150 (A) 05/27/2019 0524   HGBUR MODERATE (A) 05/27/2019 0524   BILIRUBINUR NEGATIVE 05/27/2019 0524   KETONESUR NEGATIVE 05/27/2019 0524   PROTEINUR 100 (A) 05/27/2019 0524   UROBILINOGEN 0.2 03/03/2010 1056   NITRITE NEGATIVE 05/27/2019 0524   LEUKOCYTESUR TRACE (A) 05/27/2019 0524   Sepsis Labs Invalid input(s): PROCALCITONIN,  WBC,  LACTICIDVEN   Time coordinating  discharge: 45 minutes  SIGNED:  Mercy Riding, MD  [facility] 09/11/2019, 7:34 AM  If 7PM-7AM, please contact night-coverage [email or web address] Password [email or web address]

## 2019-09-11 NOTE — Progress Notes (Signed)
[facility] Progress Note     Assessment/ Plan:   1. SOB/COPD/pulmonary edema-Felt to be multifactorial - COPD/ Afib with rapid rate and volume overload. Converted to HD for ultrafiltration.  S/p Steroids/inhalers/azithro.  Improved   2. ESRD-secondary to polycystic kidney disease. Has been on PD for 6 mos. followed in [place]. Now converted to HD in light of major abd surgery but PD catheter remains in place. Appreciate the [facility] TC 5/19 by VIR.  Tolerated hemodialysis yesterday with 2 L ultrafiltration.  Increasing ultrafiltration as able.   Has spot at [facility] 2nd shift (MWF regimen) 1145AM.  Not clear how feasible transition back to PD will be but he would really prefer that if possible - will need to address with surgeon at outpt f/u.  We are flushing his peritoneal dialysis catheter once a week permitted by surgery.  3. Anemia- Hb 9s, started low dose ESA.  Iron sat low at 10%, will give IV iron after antibiotic course complete given cellulitis seemed quite significant.  Unclear when course will be over  4. Secondary hyperparathyroidism -l previously with hyperphosphatemia and now with phosphorus on the lower side.  Holding any binders. 5. HTN/volume-continue carvedilol and diltiazem for atrial fibrillation.  6. Leukocytosis:much improved from 22 to normal range after abd washout + ancef for cellulitis.  PD fluid had been negative for infection earlier in hospitalization 7. Partial small bowel obstruction: Better now, tolerating PO. Surgery following and appreciate assistance; dense adhesions on 5/17. Back to OR earlier this week for washout and retention sutures - seems to be ok for now.  Diet advancing and now off tpn 8. Hyperdense kidney cyst: Consider further evaluation with MRI in the future.  [place] for d/c from renal perspective.    Subjective:   Patient feels well today.  No complaints discharging.   Objective:   BP (!) 119/97 (BP Location:  Right Arm)   Pulse 87   Temp 98 F (36.7 C) (Oral)   Resp 18   Ht 6\' 4"  (1.93 m)   Wt 87.6 kg   SpO2 98%   BMI 23.50 kg/m   Intake/Output Summary (Last 24 hours) at 09/11/2019 1629 Last data filed at 09/11/2019 0800 Gross per 24 hour  Intake 360 ml  Output 350 ml  Net 10 ml   Weight change: 0.99 kg  Physical Exam: General:Sitting in bed, no distress Heart:Normal rate Lungs:Bilateral chest rise, no increased work of breathing Abdomen:Distended with abdominal binder in place Extremities: Trace edema Dialysis Access: PD cath,[facility] TC 5/19 Access: Lt arm PICC  Imaging: No results found.  Labs: BMET Recent Labs  Lab 09/05/19 0511 09/06/19 0523 09/07/19 0322 09/08/19 0500 09/09/19 0500 09/10/19 0615 09/11/19 0500  NA 129* 133* 129* 131* 131* 133* 131*  K 5.1 3.3* 3.3* 3.3* 3.9 4.2 4.7  CL 93* 100 97* 97* 96* 99 98  CO2 20* 25 22 26 25 25 24   GLUCOSE 95 141* 126* 110* 95 109* 101*  BUN 93* 38* 54* 34* 47* 34* 42*  CREATININE 7.10* 4.07* 4.80* 3.48* 4.39* 3.48* 4.52*  CALCIUM 7.9* 7.0* 7.1* 7.2* 7.2* 7.4* 7.5*  PHOS 4.6 3.1 3.1 2.5 3.0 2.4* 2.9   CBC Recent Labs  Lab 09/05/19 0511 09/05/19 0511 09/06/19 0523 09/07/19 0322 09/09/19 0500 09/10/19 0615  WBC 22.2*   < > 10.3 11.4* 11.3* 10.4  NEUTROABS 20.1*  --   --   --   --  8.8*  HGB 11.1*   < > 9.3* 9.7*  9.4* 9.5*  HCT 34.0*   < > 29.0* 30.7* 30.0* 30.5*  MCV 100.3*   < > 100.3* 101.7* 101.0* 101.7*  PLT 219   < > 180 211 214 240   < > = values in this interval not displayed.    Medications:    Reviewed in epic

## 2019-09-11 NOTE — Progress Notes (Signed)
6 Days Post-Op    CC:  Abdominal pain  Subjective: He says he is going home today.  He had a lot of drainage on the dressing this AM and one proline that has broken thur one side .  Retention sutures are intact.  Wound looks fine, I probed it with sterile applicator stick and didn't really find anything to be concerned about.   Objective: Vital signs in last 24 hours: Temp:  [98 F (36.7 C)-99.3 F (37.4 C)] 98 F (36.7 C) (05/30 0725) Pulse Rate:  [86-107] 87 (05/30 0725) Resp:  [16-18] 18 (05/30 0725) BP: (118-140)/(79-99) 119/97 (05/30 0725) SpO2:  [97 %-100 %] 98 % (05/30 0852) Weight:  [87.6 kg] 87.6 kg (05/30 0106) Last BM Date: 09/07/19 720 PO recorded  350 urine Afebrile, VSS Labs OK    Intake/Output from previous day: 05/29 0701 - 05/30 0700 In: 720 [P.O.:720] Out: 350 [Urine:350] Intake/Output this shift: No intake/output data recorded.  General appearance: alert, cooperative and no distress GI: soft, some distension, retention sutures are intact.  Mid line wound is granulating in.  No sites of drainage noted on exam. he has one proline that is broken from the left side.  remaining sutures appear intact.    Lab Results:  Recent Labs    09/09/19 0500 09/10/19 0615  WBC 11.3* 10.4  HGB 9.4* 9.5*  HCT 30.0* 30.5*  PLT 214 240    BMET Recent Labs    09/10/19 0615 09/11/19 0500  NA 133* 131*  K 4.2 4.7  CL 99 98  CO2 25 24  GLUCOSE 109* 101*  BUN 34* 42*  CREATININE 3.48* 4.52*  CALCIUM 7.4* 7.5*   PT/INR No results for input(s): LABPROT, INR in the last 72 hours.  Recent Labs  Lab 09/05/19 0511 09/05/19 0511 09/06/19 0523 09/06/19 0523 09/07/19 0322 09/08/19 0500 09/09/19 0500 09/10/19 0615 09/11/19 0500  AST 14*  --  16  --   --  19  --   --   --   ALT 6  --  <5  --   --  <5  --   --   --   ALKPHOS 78  --  71  --   --  81  --   --   --   BILITOT 0.7  --  0.4  --   --  0.3  --   --   --   PROT 5.3*  --  4.8*  --   --  4.7*  --   --    --   ALBUMIN 1.8*   < > 1.7*   < > 1.6* 1.5* 1.5* 1.6* 1.4*   < > = values in this interval not displayed.     Lipase     Component Value Date/Time   LIPASE 23 08/25/2019 1740     Medications: . apixaban  5 mg Oral BID  . atorvastatin  80 mg Oral Daily  . budesonide (PULMICORT) nebulizer solution  0.25 mg Nebulization BID  . Chlorhexidine Gluconate Cloth  6 each Topical Daily  . darbepoetin (ARANESP) injection - DIALYSIS  60 mcg Intravenous Q Wed-HD  . docusate sodium  100 mg Oral Daily  . gabapentin  300 mg Oral QHS  . gentamicin cream  1 application Topical Daily  . guaiFENesin-dextromethorphan  5 mL Oral Q8H  . ipratropium-albuterol  3 mL Nebulization BID  . metoprolol tartrate  25 mg Oral BID  . multivitamin  1 tablet Oral QHS  .  pantoprazole  40 mg Oral BID  . polyethylene glycol  17 g Oral Daily  . potassium chloride  20 mEq Oral BID  . sodium chloride flush  10-40 mL Intracatheter Q12H    Assessment/Plan CHF COPD A fib  Polycystic kidney disease, ESRD, on PD - converted to HD temporarily H/O MI - plavix on hold as of 5/13 CAD Hep C HLD Hypomagnesemia  SBO Exploratory laparotomy, LOA, repair of SB serosal injury x3, resection of mesenteric nodule 08/29/19 Dr. Kirk Ruths 13 Exploratory laparotomy, washout, abdominal wall closure with placement of retention sutures for fascial dehiscence5/24/21 Dr. Debby Freiberg 6 - AFVSS, WBC 10.5>>15.7>>22.2>>10.3>>11.4>>11.3>>10.4 - OOB/mobilize, PT/OT - IS 10x q 1h - staples removed 5/28 >> wet to dry dressing and binder  FEN - carb mod/renal diet VTE - SCDs,Eliquis ID -Ancef for cellulitis 5/23 >>day6  Plan:  Wet to dry dressing as instructed.  Follow up in the AVS.     LOS: 20 days    Aaron [name] 09/11/2019 Please see Amion

## 2019-09-11 NOTE — Progress Notes (Signed)
   This test was done yesterday 5/29 around 1400.    SATURATION QUALIFICATIONS: (This note is used to comply with regulatory documentation for home oxygen)  Patient Saturations on Room Air at Rest = 98%  Patient Saturations on Room Air while Ambulating = 93%  Patient does not need home oxygen.

## 2019-09-13 ENCOUNTER — Telehealth: Payer: Self-pay | Admitting: Emergency Medicine

## 2019-09-13 ENCOUNTER — Telehealth: Payer: Self-pay | Admitting: Cardiology

## 2019-09-13 NOTE — Telephone Encounter (Signed)
As you stated Roseanne Kaufman is out until 6/3. I doubt this would be appropriate. Likely needs to be his PCP. Will leave here for Roseanne Kaufman to comment on as well when she returns.

## 2019-09-13 NOTE — Telephone Encounter (Signed)
Spoke with Aaron [name]. Informed her that patient has not been seen by Dr. Percival Spanish since 2018. Patient has been seeing [facility] at [facility]. [name] will reach out to [facility] for orders.

## 2019-09-13 NOTE — Telephone Encounter (Signed)
stephine lowe from [facility] called and wants to know if you could be pts provider while he is on palliative care. You last saw the pt in 2019 and stephanie states she has exhausted all of her resources and does not have anyone else she can call to and ask. Notified her that provider is out of the office until   6/3  [place] Phone number [phone]

## 2019-09-13 NOTE — Telephone Encounter (Signed)
New Message   Pia Mau with [facility] is calling to see if Dr. Percival Spanish will be the attending provider for this patient. If so that need orders for symptom management and advanced care planning and to evaluate for psych or social needs. It can be faxed [phone]. Please advise.

## 2019-09-15 NOTE — Telephone Encounter (Signed)
Notified stephanie of providers recommendations , thanked me for the call

## 2019-09-15 NOTE — Telephone Encounter (Signed)
Agree with Cyril Mourning. Needs to receive orders from PCP.

## 2019-09-20 ENCOUNTER — Other Ambulatory Visit: Payer: Self-pay

## 2019-09-20 ENCOUNTER — Inpatient Hospital Stay (HOSPITAL_COMMUNITY)
Admission: EM | Admit: 2019-09-20 | Discharge: 2019-09-27 | DRG: 640 | Disposition: A | Discharge status: Home Health | Payer: 59 | Attending: Student | Admitting: Student

## 2019-09-20 ENCOUNTER — Emergency Department (HOSPITAL_COMMUNITY): Payer: 59

## 2019-09-20 ENCOUNTER — Encounter (HOSPITAL_COMMUNITY): Payer: Self-pay | Admitting: Emergency Medicine

## 2019-09-20 ENCOUNTER — Inpatient Hospital Stay (HOSPITAL_COMMUNITY): Payer: 59

## 2019-09-20 DIAGNOSIS — Z8249 Family history of ischemic heart disease and other diseases of the circulatory system: Secondary | ICD-10-CM

## 2019-09-20 DIAGNOSIS — M8949 Other hypertrophic osteoarthropathy, multiple sites: Secondary | ICD-10-CM | POA: Diagnosis present

## 2019-09-20 DIAGNOSIS — D631 Anemia in chronic kidney disease: Secondary | ICD-10-CM | POA: Diagnosis present

## 2019-09-20 DIAGNOSIS — Z9889 Other specified postprocedural states: Secondary | ICD-10-CM | POA: Diagnosis not present

## 2019-09-20 DIAGNOSIS — R05 Cough: Secondary | ICD-10-CM | POA: Diagnosis present

## 2019-09-20 DIAGNOSIS — R0602 Shortness of breath: Secondary | ICD-10-CM | POA: Diagnosis not present

## 2019-09-20 DIAGNOSIS — N186 End stage renal disease: Secondary | ICD-10-CM | POA: Diagnosis present

## 2019-09-20 DIAGNOSIS — R5381 Other malaise: Secondary | ICD-10-CM | POA: Diagnosis not present

## 2019-09-20 DIAGNOSIS — Z8041 Family history of malignant neoplasm of ovary: Secondary | ICD-10-CM

## 2019-09-20 DIAGNOSIS — J9621 Acute and chronic respiratory failure with hypoxia: Secondary | ICD-10-CM | POA: Diagnosis present

## 2019-09-20 DIAGNOSIS — E8779 Other fluid overload: Principal | ICD-10-CM | POA: Diagnosis present

## 2019-09-20 DIAGNOSIS — Z811 Family history of alcohol abuse and dependence: Secondary | ICD-10-CM

## 2019-09-20 DIAGNOSIS — M549 Dorsalgia, unspecified: Secondary | ICD-10-CM | POA: Diagnosis present

## 2019-09-20 DIAGNOSIS — K5792 Diverticulitis of intestine, part unspecified, without perforation or abscess without bleeding: Secondary | ICD-10-CM | POA: Diagnosis present

## 2019-09-20 DIAGNOSIS — K219 Gastro-esophageal reflux disease without esophagitis: Secondary | ICD-10-CM | POA: Diagnosis present

## 2019-09-20 DIAGNOSIS — Z20822 Contact with and (suspected) exposure to covid-19: Secondary | ICD-10-CM | POA: Diagnosis present

## 2019-09-20 DIAGNOSIS — I482 Chronic atrial fibrillation, unspecified: Secondary | ICD-10-CM | POA: Diagnosis present

## 2019-09-20 DIAGNOSIS — E8770 Fluid overload, unspecified: Secondary | ICD-10-CM | POA: Diagnosis not present

## 2019-09-20 DIAGNOSIS — E871 Hypo-osmolality and hyponatremia: Secondary | ICD-10-CM | POA: Diagnosis not present

## 2019-09-20 DIAGNOSIS — R092 Respiratory arrest: Secondary | ICD-10-CM | POA: Diagnosis not present

## 2019-09-20 DIAGNOSIS — I480 Paroxysmal atrial fibrillation: Secondary | ICD-10-CM | POA: Diagnosis present

## 2019-09-20 DIAGNOSIS — T8130XS Disruption of wound, unspecified, sequela: Secondary | ICD-10-CM | POA: Diagnosis not present

## 2019-09-20 DIAGNOSIS — Z8051 Family history of malignant neoplasm of kidney: Secondary | ICD-10-CM

## 2019-09-20 DIAGNOSIS — I252 Old myocardial infarction: Secondary | ICD-10-CM

## 2019-09-20 DIAGNOSIS — E440 Moderate protein-calorie malnutrition: Secondary | ICD-10-CM | POA: Diagnosis present

## 2019-09-20 DIAGNOSIS — A419 Sepsis, unspecified organism: Secondary | ICD-10-CM | POA: Diagnosis not present

## 2019-09-20 DIAGNOSIS — T148XXA Other injury of unspecified body region, initial encounter: Secondary | ICD-10-CM | POA: Diagnosis not present

## 2019-09-20 DIAGNOSIS — E785 Hyperlipidemia, unspecified: Secondary | ICD-10-CM | POA: Diagnosis present

## 2019-09-20 DIAGNOSIS — E875 Hyperkalemia: Secondary | ICD-10-CM | POA: Diagnosis present

## 2019-09-20 DIAGNOSIS — W19XXXA Unspecified fall, initial encounter: Secondary | ICD-10-CM | POA: Diagnosis present

## 2019-09-20 DIAGNOSIS — G8929 Other chronic pain: Secondary | ICD-10-CM | POA: Diagnosis present

## 2019-09-20 DIAGNOSIS — I132 Hypertensive heart and chronic kidney disease with heart failure and with stage 5 chronic kidney disease, or end stage renal disease: Secondary | ICD-10-CM | POA: Diagnosis present

## 2019-09-20 DIAGNOSIS — J9811 Atelectasis: Secondary | ICD-10-CM | POA: Diagnosis present

## 2019-09-20 DIAGNOSIS — Z9115 Patient's noncompliance with renal dialysis: Secondary | ICD-10-CM | POA: Diagnosis not present

## 2019-09-20 DIAGNOSIS — I5033 Acute on chronic diastolic (congestive) heart failure: Secondary | ICD-10-CM | POA: Diagnosis present

## 2019-09-20 DIAGNOSIS — J449 Chronic obstructive pulmonary disease, unspecified: Secondary | ICD-10-CM | POA: Diagnosis present

## 2019-09-20 DIAGNOSIS — T8132XA Disruption of internal operation (surgical) wound, not elsewhere classified, initial encounter: Secondary | ICD-10-CM | POA: Diagnosis present

## 2019-09-20 DIAGNOSIS — K5900 Constipation, unspecified: Secondary | ICD-10-CM | POA: Diagnosis present

## 2019-09-20 DIAGNOSIS — Y92009 Unspecified place in unspecified non-institutional (private) residence as the place of occurrence of the external cause: Secondary | ICD-10-CM

## 2019-09-20 DIAGNOSIS — N189 Chronic kidney disease, unspecified: Secondary | ICD-10-CM | POA: Diagnosis not present

## 2019-09-20 DIAGNOSIS — K56600 Partial intestinal obstruction, unspecified as to cause: Secondary | ICD-10-CM | POA: Diagnosis present

## 2019-09-20 DIAGNOSIS — L03116 Cellulitis of left lower limb: Secondary | ICD-10-CM | POA: Diagnosis present

## 2019-09-20 DIAGNOSIS — Z800 Family history of malignant neoplasm of digestive organs: Secondary | ICD-10-CM

## 2019-09-20 DIAGNOSIS — I471 Supraventricular tachycardia: Secondary | ICD-10-CM | POA: Diagnosis present

## 2019-09-20 DIAGNOSIS — R12 Heartburn: Secondary | ICD-10-CM | POA: Diagnosis not present

## 2019-09-20 DIAGNOSIS — Z66 Do not resuscitate: Secondary | ICD-10-CM | POA: Diagnosis present

## 2019-09-20 DIAGNOSIS — Z9981 Dependence on supplemental oxygen: Secondary | ICD-10-CM

## 2019-09-20 DIAGNOSIS — I2510 Atherosclerotic heart disease of native coronary artery without angina pectoris: Secondary | ICD-10-CM | POA: Diagnosis present

## 2019-09-20 DIAGNOSIS — S91302A Unspecified open wound, left foot, initial encounter: Secondary | ICD-10-CM | POA: Diagnosis present

## 2019-09-20 DIAGNOSIS — T8141XA Infection following a procedure, superficial incisional surgical site, initial encounter: Secondary | ICD-10-CM | POA: Diagnosis present

## 2019-09-20 DIAGNOSIS — J418 Mixed simple and mucopurulent chronic bronchitis: Secondary | ICD-10-CM | POA: Diagnosis not present

## 2019-09-20 DIAGNOSIS — Z992 Dependence on renal dialysis: Secondary | ICD-10-CM

## 2019-09-20 DIAGNOSIS — M25569 Pain in unspecified knee: Secondary | ICD-10-CM | POA: Diagnosis present

## 2019-09-20 DIAGNOSIS — Z7951 Long term (current) use of inhaled steroids: Secondary | ICD-10-CM

## 2019-09-20 DIAGNOSIS — W228XXA Striking against or struck by other objects, initial encounter: Secondary | ICD-10-CM | POA: Diagnosis present

## 2019-09-20 DIAGNOSIS — Z79899 Other long term (current) drug therapy: Secondary | ICD-10-CM

## 2019-09-20 DIAGNOSIS — I4819 Other persistent atrial fibrillation: Secondary | ICD-10-CM | POA: Diagnosis not present

## 2019-09-20 DIAGNOSIS — B1920 Unspecified viral hepatitis C without hepatic coma: Secondary | ICD-10-CM | POA: Diagnosis present

## 2019-09-20 DIAGNOSIS — F17210 Nicotine dependence, cigarettes, uncomplicated: Secondary | ICD-10-CM | POA: Diagnosis present

## 2019-09-20 DIAGNOSIS — F419 Anxiety disorder, unspecified: Secondary | ICD-10-CM | POA: Diagnosis present

## 2019-09-20 DIAGNOSIS — I4891 Unspecified atrial fibrillation: Secondary | ICD-10-CM | POA: Diagnosis not present

## 2019-09-20 DIAGNOSIS — R6521 Severe sepsis with septic shock: Secondary | ICD-10-CM | POA: Diagnosis not present

## 2019-09-20 DIAGNOSIS — K659 Peritonitis, unspecified: Secondary | ICD-10-CM | POA: Diagnosis not present

## 2019-09-20 DIAGNOSIS — Z7901 Long term (current) use of anticoagulants: Secondary | ICD-10-CM

## 2019-09-20 DIAGNOSIS — Z6824 Body mass index (BMI) 24.0-24.9, adult: Secondary | ICD-10-CM

## 2019-09-20 LAB — CBC WITH DIFFERENTIAL/PLATELET
Abs Immature Granulocytes: 0.11 10*3/uL — ABNORMAL HIGH (ref 0.00–0.07)
Basophils Absolute: 0 10*3/uL (ref 0.0–0.1)
Basophils Relative: 0 %
Eosinophils Absolute: 0 10*3/uL (ref 0.0–0.5)
Eosinophils Relative: 0 %
HCT: 31.1 % — ABNORMAL LOW (ref 39.0–52.0)
Hemoglobin: 9.9 g/dL — ABNORMAL LOW (ref 13.0–17.0)
Immature Granulocytes: 1 %
Lymphocytes Relative: 5 %
Lymphs Abs: 0.7 10*3/uL (ref 0.7–4.0)
MCH: 31 pg (ref 26.0–34.0)
MCHC: 31.8 g/dL (ref 30.0–36.0)
MCV: 97.5 fL (ref 80.0–100.0)
Monocytes Absolute: 1 10*3/uL (ref 0.1–1.0)
Monocytes Relative: 8 %
Neutro Abs: 10.8 10*3/uL — ABNORMAL HIGH (ref 1.7–7.7)
Neutrophils Relative %: 86 %
Platelets: 594 10*3/uL — ABNORMAL HIGH (ref 150–400)
RBC: 3.19 MIL/uL — ABNORMAL LOW (ref 4.22–5.81)
RDW: 15.9 % — ABNORMAL HIGH (ref 11.5–15.5)
WBC: 12.6 10*3/uL — ABNORMAL HIGH (ref 4.0–10.5)
nRBC: 0 % (ref 0.0–0.2)

## 2019-09-20 LAB — COMPREHENSIVE METABOLIC PANEL
ALT: 8 U/L (ref 0–44)
AST: 48 U/L — ABNORMAL HIGH (ref 15–41)
Albumin: 2 g/dL — ABNORMAL LOW (ref 3.5–5.0)
Alkaline Phosphatase: 148 U/L — ABNORMAL HIGH (ref 38–126)
Anion gap: 16 — ABNORMAL HIGH (ref 5–15)
BUN: 108 mg/dL — ABNORMAL HIGH (ref 8–23)
CO2: 18 mmol/L — ABNORMAL LOW (ref 22–32)
Calcium: 8.1 mg/dL — ABNORMAL LOW (ref 8.9–10.3)
Chloride: 96 mmol/L — ABNORMAL LOW (ref 98–111)
Creatinine, Ser: 10 mg/dL — ABNORMAL HIGH (ref 0.61–1.24)
GFR calc Af Amer: 6 mL/min — ABNORMAL LOW (ref >60)
GFR calc non Af Amer: 5 mL/min — ABNORMAL LOW (ref >60)
Glucose, Bld: 87 mg/dL (ref 70–99)
Potassium: >7.5 mmol/L (ref 3.5–5.1)
Sodium: 130 mmol/L — ABNORMAL LOW (ref 135–145)
Total Bilirubin: 0.5 mg/dL (ref 0.3–1.2)
Total Protein: 6.3 g/dL — ABNORMAL LOW (ref 6.5–8.1)

## 2019-09-20 LAB — PROCALCITONIN: Procalcitonin: 1.24 ng/mL

## 2019-09-20 LAB — HEPATITIS B SURFACE ANTIGEN: Hepatitis B Surface Ag: NONREACTIVE

## 2019-09-20 LAB — TROPONIN I (HIGH SENSITIVITY)
Troponin I (High Sensitivity): 29 ng/L — ABNORMAL HIGH (ref <18)
Troponin I (High Sensitivity): 28 ng/L — ABNORMAL HIGH (ref <18)

## 2019-09-20 LAB — MAGNESIUM: Magnesium: 2.1 mg/dL (ref 1.7–2.4)

## 2019-09-20 LAB — CBG MONITORING, ED: Glucose-Capillary: 72 mg/dL (ref 70–99)

## 2019-09-20 LAB — SARS CORONAVIRUS 2 BY RT PCR (HOSPITAL ORDER, PERFORMED IN ~~LOC~~ HOSPITAL LAB): SARS Coronavirus 2: NEGATIVE

## 2019-09-20 MED ORDER — ALBUTEROL SULFATE (2.5 MG/3ML) 0.083% IN NEBU
10.0000 mg | INHALATION_SOLUTION | Freq: Once | RESPIRATORY_TRACT | Status: AC
  Administered 2019-09-20: 10 mg via RESPIRATORY_TRACT
  Filled 2019-09-20: qty 12

## 2019-09-20 MED ORDER — DILTIAZEM LOAD VIA INFUSION
10.0000 mg | Freq: Once | INTRAVENOUS | Status: AC
  Administered 2019-09-20: 10 mg via INTRAVENOUS
  Filled 2019-09-20: qty 10

## 2019-09-20 MED ORDER — HYDROMORPHONE HCL 1 MG/ML IJ SOLN
0.5000 mg | INTRAMUSCULAR | Status: DC | PRN
  Administered 2019-09-20 – 2019-09-24 (×13): 0.5 mg via INTRAVENOUS
  Filled 2019-09-20 (×13): qty 1

## 2019-09-20 MED ORDER — SODIUM CHLORIDE 0.9 % IV SOLN
1.0000 g | INTRAVENOUS | Status: DC
  Administered 2019-09-20 – 2019-09-21 (×2): 1 g via INTRAVENOUS
  Filled 2019-09-20: qty 1
  Filled 2019-09-20 (×2): qty 10

## 2019-09-20 MED ORDER — SODIUM BICARBONATE 8.4 % IV SOLN
INTRAVENOUS | Status: AC
  Filled 2019-09-20: qty 50

## 2019-09-20 MED ORDER — DILTIAZEM HCL-DEXTROSE 125-5 MG/125ML-% IV SOLN (PREMIX)
5.0000 mg/h | INTRAVENOUS | Status: DC
  Administered 2019-09-20: 10 mg/h via INTRAVENOUS
  Filled 2019-09-20: qty 125

## 2019-09-20 MED ORDER — SODIUM BICARBONATE 8.4 % IV SOLN
50.0000 meq | Freq: Once | INTRAVENOUS | Status: AC
  Administered 2019-09-20: 50 meq via INTRAVENOUS

## 2019-09-20 MED ORDER — ATORVASTATIN CALCIUM 80 MG PO TABS
80.0000 mg | ORAL_TABLET | Freq: Every day | ORAL | Status: DC
  Administered 2019-09-21 – 2019-09-27 (×7): 80 mg via ORAL
  Filled 2019-09-20 (×5): qty 1
  Filled 2019-09-20: qty 2
  Filled 2019-09-20: qty 1

## 2019-09-20 MED ORDER — CALCIUM ACETATE (PHOS BINDER) 667 MG PO CAPS
667.0000 mg | ORAL_CAPSULE | Freq: Three times a day (TID) | ORAL | Status: DC
  Administered 2019-09-21 (×3): 667 mg via ORAL
  Filled 2019-09-20 (×9): qty 1

## 2019-09-20 MED ORDER — CALCIUM GLUCONATE-NACL 2-0.675 GM/100ML-% IV SOLN: 2.0000 g | Freq: Once | INTRAVENOUS | Status: DC

## 2019-09-20 MED ORDER — POLYETHYLENE GLYCOL 3350 17 G PO PACK
17.0000 g | PACK | Freq: Every day | ORAL | Status: DC | PRN
  Administered 2019-09-26: 17 g via ORAL
  Filled 2019-09-20: qty 1

## 2019-09-20 MED ORDER — DILTIAZEM LOAD VIA INFUSION
10.0000 mg | INTRAVENOUS | Status: DC
  Filled 2019-09-20: qty 10

## 2019-09-20 MED ORDER — ACETAMINOPHEN 325 MG PO TABS: 650.0000 mg | ORAL_TABLET | Freq: Four times a day (QID) | ORAL | Status: DC | PRN

## 2019-09-20 MED ORDER — FLUTICASONE FUROATE-VILANTEROL 200-25 MCG/INH IN AEPB
1.0000 | INHALATION_SPRAY | Freq: Every day | RESPIRATORY_TRACT | Status: DC | PRN
  Filled 2019-09-20: qty 28

## 2019-09-20 MED ORDER — DILTIAZEM HCL 25 MG/5ML IV SOLN
INTRAVENOUS | Status: AC
  Filled 2019-09-20: qty 5

## 2019-09-20 MED ORDER — SODIUM CHLORIDE 0.9 % IV SOLN
100.0000 mg | Freq: Two times a day (BID) | INTRAVENOUS | Status: DC
  Administered 2019-09-20 – 2019-09-21 (×3): 100 mg via INTRAVENOUS
  Filled 2019-09-20 (×7): qty 100

## 2019-09-20 MED ORDER — DEXTROSE 50 % IV SOLN
1.0000 | Freq: Once | INTRAVENOUS | Status: AC
  Administered 2019-09-20: 50 mL via INTRAVENOUS
  Filled 2019-09-20: qty 50

## 2019-09-20 MED ORDER — CALCIUM GLUCONATE-NACL 1-0.675 GM/50ML-% IV SOLN
1.0000 g | INTRAVENOUS | Status: AC
  Administered 2019-09-20: 1000 mg via INTRAVENOUS
  Filled 2019-09-20 (×2): qty 50

## 2019-09-20 MED ORDER — INSULIN ASPART 100 UNIT/ML IV SOLN
5.0000 [IU] | Freq: Once | INTRAVENOUS | Status: AC
  Administered 2019-09-20: 5 [IU] via INTRAVENOUS

## 2019-09-20 MED ORDER — CALCIUM GLUCONATE-NACL 1-0.675 GM/50ML-% IV SOLN
1.0000 g | Freq: Once | INTRAVENOUS | Status: AC
  Administered 2019-09-20: 1000 mg via INTRAVENOUS
  Filled 2019-09-20: qty 50

## 2019-09-20 MED ORDER — ACETAMINOPHEN 650 MG RE SUPP: 650.0000 mg | Freq: Four times a day (QID) | RECTAL | Status: DC | PRN

## 2019-09-20 MED ORDER — HEPARIN SODIUM (PORCINE) 1000 UNIT/ML IJ SOLN
4200.0000 [IU] | Freq: Once | INTRAMUSCULAR | Status: AC
  Administered 2019-09-20: 4200 [IU]

## 2019-09-20 MED ORDER — IPRATROPIUM-ALBUTEROL 0.5-2.5 (3) MG/3ML IN SOLN
3.0000 mL | RESPIRATORY_TRACT | Status: DC | PRN
  Administered 2019-09-25 – 2019-09-26 (×2): 3 mL via RESPIRATORY_TRACT
  Filled 2019-09-20 (×4): qty 3

## 2019-09-20 NOTE — ED Notes (Signed)
Dressing change on abdominal wound.

## 2019-09-20 NOTE — ED Triage Notes (Signed)
Patient is having increased shortness of breath that started last night, also fell last night striking head on sofa, believes he blacked out, because he was incontinent of urine.  Dialysis pt, has missed last 4 treatments, because he didn't feel like going.  Stomach is grossly distended.

## 2019-09-20 NOTE — ED Notes (Signed)
CRITICAL VALUE ALERT  Critical Value:  Potassium > 7.5  Date & Time Notied: 09/20/19 1508  Provider Notified: Dr. Tomi Bamberger  Orders Received/Actions taken: See chart

## 2019-09-20 NOTE — Consult Note (Addendum)
Aaron [name] Admit Date: 09/20/2019 09/20/2019 Aaron [name] Requesting Physician:  Tomi Bamberger MD  Reason for Consult:  ESRD Hyerkalemia with WCT HPI:  44M ESRD recent transition from PD to HD after SBO requiring ex lap, recent admission for COPD exacerbation and A. fib with RVR.  He was discharged from [facility] on 5/30 to initiate HD on MWF schedule at [facility] in [place].  He did not go to any treatments and presented today to the ER with complaints of feeling poorly, weak, coughing.  Triage EKG with wide complex tachycardia with nearly saw-wave of appearance.  Presumably was hyperkalemic, treated appropriately.  Including insulin, bicarbonate, calcium.  Labs revealed potassium of greater than 7.5, bicarbonate of 18, BUN 108.  He has a left IJ TDC.  Patient states he did not go to his outpatient HD unit because he felt bad.    ROS Balance of 12 systems is negative w/ exceptions as above  PMH  Past Medical History:  Diagnosis Date  . Anxiety   . Arthritis    knees , back , shoulders (10/12/2017)  . Atrial fibrillation ([place])   . CAD (coronary artery disease)    Mild nonobstructive disease at cardiac catheterization 2002  . Chronic back pain    "back of my neck; down thru my legs" (10/12/2017)  . COPD (chronic obstructive pulmonary disease) ([place])   . Diverticulitis   . Esophageal reflux   . ESRD (end stage renal disease) on dialysis [facility])    "TTS; [place]" (11/23/2017)  . Essential hypertension   . Hepatitis C    states he no longer has this  . History of kidney stones   . History of syncope   . Hyperlipidemia   . Jerking 09/23/2014  . Myocardial infarction ([place]) 02/2017   "light one" (10/12/2017)  . Non-compliant behavior    non compliant with diaylsis per daughter  . PAT (paroxysmal atrial tachycardia) ([place])   . Peripheral arterial disease ([place])    Occluded left superficial femoral artery status post stent June 2016 - Dr. Trula Slade  . Pneumonia 1961  . Polycystic kidney, unspecified  type   . Syncope 09/2014  . SYNCOPE 05/07/2010   Qualifier: Diagnosis of  By: Laurance Flatten RN, BSN, Caryl Asp  Past Surgical History:  Procedure Laterality Date  . ABDOMINAL AORTOGRAM N/A 08/11/2017   Procedure: ABDOMINAL AORTOGRAM;  Surgeon: Serafina Mitchell, MD;  Location: [place] [facility];  Service: Cardiovascular;  Laterality: N/A;  . ANTERIOR CERVICAL DECOMP/DISCECTOMY FUSION    . APPLICATION OF WOUND VAC Right 08/14/2017   Procedure: APPLICATION OF PREVENA INCISIONAL WOUND VAC RIGHT GROIN;  Surgeon: Serafina Mitchell, MD;  [facility];  Service: Vascular;  Laterality: Right;  . AV FISTULA PLACEMENT Left 09/04/2017   Procedure: ARTERIOVENOUS (AV) FISTULA CREATION LEFT UPPER ARM;  Surgeon: Serafina Mitchell, MD;  Location: [place];  Service: Vascular;  Laterality: Left;  . BACK SURGERY    . BASCILIC VEIN TRANSPOSITION Left 11/20/2017   Procedure: SECOND STAGE BASILIC VEIN TRANSPOSITION LEFT ARM;  Surgeon: Serafina Mitchell, MD;  Location: [place];  Service: Vascular;  Laterality: Left;  . BIOPSY  12/17/2016   Procedure: BIOPSY;  Surgeon: Daneil Dolin, MD;  [facility];  Service: Gastroenterology;;  gastric colon  . BIOPSY  04/29/2017   Procedure: BIOPSY;  Surgeon: Daneil Dolin, MD;  [facility];  Service: Endoscopy;;  duodenal biopsies  . BUNIONECTOMY Bilateral   .  CENTRAL VENOUS CATHETER INSERTION Right 08/29/2019   Procedure: Attempted Insertion Central Line Adult;  Surgeon: Jesusita Oka, MD;  Location: [place];  Service: General;  Laterality: Right;  . COLONOSCOPY  2008   Dr. Oneida Alar: rare sigmoid colon diverticulosis, internal hemorrhoids.   . COLONOSCOPY WITH PROPOFOL N/A 12/17/2016   dense left-sided diverticulosis, right colon ulcers s/p biopsy query occult NSAID use vs transient ischemia, not consistent with IBD. CMV stains negative.   Marland Kitchen ENDARTERECTOMY FEMORAL Right 08/14/2017   Procedure: RIGHT ILLIO-FEMORAL ENDARTERECTOMY;  Surgeon: Serafina Mitchell, MD;   Location[facility] OR;  Service: Vascular;  Laterality: Right;  . ESOPHAGEAL DILATION  12/17/2016   EGD with mild Schatzki's ring s/p dilatation, small hiatal hernia, erosive gastropathy (negative H.pylori gastritis)  . ESOPHAGOGASTRODUODENOSCOPY  2008   Dr. Oneida Alar: normal esophagus without Barrett's, antritis and duodenitis, path with H.pylori gastritis  . ESOPHAGOGASTRODUODENOSCOPY (EGD) WITH PROPOFOL N/A 12/17/2016   Procedure: ESOPHAGOGASTRODUODENOSCOPY (EGD) WITH PROPOFOL;  Surgeon: Daneil Dolin, MD;  [facility];  Service: Gastroenterology;  Laterality: N/A;  . ESOPHAGOGASTRODUODENOSCOPY (EGD) WITH PROPOFOL N/A 04/29/2017   Patchy erythema of gastric mucosa diffusely, extensive inflammatory changes in duodenum, geographic ulceration and mucosal edema present, encroaching somewhat on the lumen yet still widely patent, distal second portion of duodenum appeared abnormal, path with peptic duodenitis with ulceration  . GROIN DEBRIDEMENT Right 10/14/2017   Procedure: RIGHT GROIN AND RIGHT LOWER QUADRANT ABDOMEN DEBRIDEMENT WITH PLACEMENT OF ANTIBIOTIC BEADS;  Surgeon: Serafina Mitchell, MD;  Location: [place];  Service: Vascular;  Laterality: Right;  . HEMATOMA EVACUATION Left 12/06/2017   Procedure: EVACUATION HEMATOMA;  Surgeon: Waynetta Sandy, MD;  Location: [place];  Service: Vascular;  Laterality: Left;  . HERNIA REPAIR  0962   umbilical  . I & D EXTREMITY Right 09/21/2017   Procedure: IRRIGATION AND DEBRIDEMENT GROIN;  Surgeon: Elam Dutch, MD;  Location: [place];  Service: Vascular;  Laterality: Right;  . I & D EXTREMITY Left 11/23/2017   Procedure: Evacuation Hematoma LEFT UPPER ARM GRAFT;  Surgeon: Elam Dutch, MD;  Location: [place];  Service: Vascular;  Laterality: Left;  . INGUINAL HERNIA REPAIR Right 02/2017   [place]  . INSERTION OF DIALYSIS CATHETER Right 09/04/2017   Procedure: INSERTION OF TUNNELED  DIALYSIS CATHETER - RIGHT INTERNAL JUGULAR PLACEMENT;  Surgeon:  Serafina Mitchell, MD;  Location: [place];  Service: Vascular;  Laterality: Right;  . INSERTION OF DIALYSIS CATHETER Right 07/26/2018   Procedure: INSERTION OF DIALYSIS CATHETER RIGHT INTERNAL JUGULAR;  Surgeon: Marty Heck, MD;  Location: [place];  Service: Vascular;  Laterality: Right;  . INSERTION OF ILIAC STENT Right 08/14/2017   Procedure: INSERTION OF RIGHT COMMON ILIAC STENT 44mm x 7mm x 130cm INSERTION OF RIGHT EXTERNAL ILIAC STENT 46mm x 16mm x 130cm INSERTION OF SUPERFICIAL FERMORAL ARTERY STENT 34mm x 67mm x 130cm;  Surgeon: Serafina Mitchell, MD;  Location: [place];  Service: Vascular;  Laterality: Right;  . IR FLUORO GUIDE CV LINE LEFT  08/31/2019  . IR FLUORO GUIDE CV LINE RIGHT  08/31/2017  . IR FLUORO GUIDE CV LINE RIGHT  08/30/2019  . IR REMOVAL TUN CV CATH W/O FL  05/28/2018  . IR REMOVAL TUN CV CATH W/O FL  03/07/2019  . IR US GUIDE VASC ACCESS LEFT  08/31/2019  . IR US GUIDE VASC ACCESS RIGHT  08/31/2017  . IR US GUIDE VASC ACCESS RIGHT  08/30/2019  . IRRIGATION AND DEBRIDEMENT ABDOMEN  09/05/2019   Procedure: Irrigation And Debridement Abdomen, Placement of retention sutures;  Surgeon: Donnie Mesa, MD;  Location: [place];  Service: General;;  . LAPAROTOMY N/A 08/29/2019   Procedure: EXPLORATORY LAPAROTOMY, REPAIR OF SEROSAL INJURY TIMES THREE;  Surgeon: Jesusita Oka, MD;  Location: [place];  Service: General;  Laterality: N/A;  . LAPAROTOMY N/A 09/05/2019   Procedure: EXPLORATORY LAPAROTOMY;  Surgeon: Donnie Mesa, MD;  Location: [place];  Service: General;  Laterality: N/A;  . LOWER EXTREMITY ANGIOGRAPHY Right 08/11/2017   Procedure: LOWER EXTREMITY ANGIOGRAPHY;  Surgeon: Serafina Mitchell, MD;  Location: [place] [facility];  Service: Cardiovascular;  Laterality: Right;  . LYSIS OF ADHESION  08/29/2019   Procedure: Lysis Of Adhesion;  Surgeon: Jesusita Oka, MD;  Location: [facility];  Service: General;;  . PATCH ANGIOPLASTY Right 08/14/2017   Procedure: PATCH ANGIOPLASTY USING HEMASHIELD  PATCH 0.3IN Lillie Columbia;  Surgeon: Serafina Mitchell, MD;  Location: [place] OR;  Service: Vascular;  Laterality: Right;  . PERIPHERAL VASCULAR CATHETERIZATION N/A 09/20/2014   Procedure: Abdominal Aortogram;  Surgeon: Serafina Mitchell, MD;  Location: [place] [facility];  Service: Cardiovascular;  Laterality: N/A;  . POSTERIOR FUSION LUMBAR SPINE    . TEE WITHOUT CARDIOVERSION N/A 01/12/2018   Procedure: TRANSESOPHAGEAL ECHOCARDIOGRAM (TEE);  Surgeon: Lelon Perla, MD;  Location: [facility];  Service: Cardiovascular;  Laterality: N/A;   FH  Family History  Problem Relation Age of Onset  . Alcoholism Mother   . Heart disease Father        Massive heart attack  . Heart attack Father   . Atrial fibrillation Father   . Colon cancer Father   . Colon cancer Maternal Grandfather 43  . Alcoholism Maternal Grandfather   . Renal cancer Cousin   . Ovarian cancer Sister    SH  reports that he has been smoking cigarettes. He has a 11.75 pack-year smoking history. He has never used smokeless tobacco. He reports previous alcohol use. He reports previous drug use. Allergies No Known Allergies Home medications Prior to Admission medications   Medication Sig Start Date End Date Taking? Authorizing Provider  albuterol (PROVENTIL HFA;VENTOLIN HFA) 108 (90 Base) MCG/ACT inhaler Inhale 2 puffs into the lungs every 6 (six) hours as needed for wheezing or shortness of breath.   Yes [provider]  albuterol (PROVENTIL) (2.5 MG/3ML) 0.083% nebulizer solution Inhale 3 mLs into the lungs 3 (three) times daily as needed for shortness of breath or wheezing. 06/23/18  Yes [provider]  apixaban (ELIQUIS) 5 MG TABS tablet Take 1 tablet (5 mg total) by mouth 2 (two) times daily. 09/11/19  Yes Mercy Riding, MD  atorvastatin (LIPITOR) 80 MG tablet Take 80 mg by mouth daily. 04/25/19  Yes [provider]  BREO ELLIPTA 200-25 MCG/INH AEPB Inhale 1 puff into the lungs daily as needed (for respiratory  issues.).  07/27/17  Yes [provider]  calcium acetate (PHOSLO) 667 MG capsule Take 667 mg by mouth 3 (three) times daily with meals. 06/29/18  Yes [provider]  cephALEXin (KEFLEX) 250 MG capsule Take 250 mg by mouth daily. 7 day course starting on 09/12/2019.   Yes [provider]  diltiazem (CARDIZEM CD) 360 MG 24 hr capsule Take 360 mg by mouth daily. 08/23/19  Yes [provider]  docusate sodium (COLACE) 100 MG capsule Take 1 capsule (100 mg total) by mouth daily. 09/11/19  Yes Mercy Riding, MD  furosemide (LASIX)  40 MG tablet Take 1 tablet (40 mg total) by mouth daily as needed for fluid or edema (Shortness of breath). 09/11/19  Yes Mercy Riding, MD  gabapentin (NEURONTIN) 300 MG capsule Take 300 mg by mouth 2 (two) times daily. 07/07/18  Yes [provider]  metoprolol tartrate (LOPRESSOR) 25 MG tablet Take 1 tablet (25 mg total) by mouth 2 (two) times daily. 09/11/19  Yes Mercy Riding, MD  multivitamin (RENA-VIT) TABS tablet Take 1 tablet by mouth at bedtime. 09/11/19  Yes Mercy Riding, MD  ondansetron (ZOFRAN) 4 MG tablet Take 1 tablet (4 mg total) by mouth every 8 (eight) hours as needed for nausea. 09/11/19  Yes Mercy Riding, MD  oxyCODONE-acetaminophen (PERCOCET) 10-325 MG tablet Take 1 tablet by mouth 5 (five) times daily as needed for pain.    Yes [provider]  pantoprazole (PROTONIX) 40 MG tablet Take 1 tablet (40 mg total) by mouth 2 (two) times daily. Patient taking differently: Take 40 mg by mouth daily.  04/30/17 01/27/26 Yes Shah, Pratik D, DO  senna-docusate (SENOKOT-S) 8.6-50 MG tablet Take 1 tablet by mouth 2 (two) times daily as needed for moderate constipation. 09/11/19  Yes Mercy Riding, MD  varenicline (CHANTIX) 0.5 MG tablet Take 0.5 mg by mouth 2 (two) times daily.   Yes [provider]  Darbepoetin Alfa (ARANESP) 60 MCG/0.3ML SOSY injection Inject 0.3 mLs (60 mcg total) into the vein every Wednesday with  hemodialysis. 09/14/19   Mercy Riding, MD    Current Medications Scheduled Meds: . albuterol  10 mg Nebulization Once   Continuous Infusions: . calcium gluconate 1,000 mg (09/20/19 1619)   PRN Meds:.  CBC Recent Labs  Lab 09/20/19 1306  WBC 12.6*  NEUTROABS 10.8*  HGB 9.9*  HCT 31.1*  MCV 97.5  PLT 144*   Basic Metabolic Panel Recent Labs  Lab 09/20/19 1306  NA 130*  K >7.5*  CL 96*  CO2 18*  GLUCOSE 87  BUN 108*  CREATININE 10.00*  CALCIUM 8.1*    Physical Exam  Blood pressure (!) 110/92, pulse (!) 140, temperature 98.2 F (36.8 C), resp. rate 20, height 6\' 4"  (1.93 m), weight 86.6 kg, SpO2 97 %. GEN: NAD, sitting up in the bed, eating dinner ENT: NCAT EYES: EOMI CV: Tachycardic, regular PULM: Distant bilaterally, diminished in the bases ABD: Soft SKIN: No rashes or lesions, left IJ TDC bandaged EXT: 1+ lower extremity edema  Assessment 24M ESRD recent PD to HD after SBO req ExLap presented with severe hyperkalemia, wide-complex tachycardia, after missing more than a week of hemodialysis  1. Severe hyperkalemia 2. ESRD 3. WCT 2/2 to #1 4. Anemia 5. CKD-BMD 6. Recent SBO 7. COPD 8. AFib  Plan 1. HD now, 1K, 2L UF, use TDC, 3h 2. Check BMP again tonight after HD 3. Make sure he will go to outpt HD when discharged   Aaron [name]  [phone] pgr 09/20/2019, 5:14 PM

## 2019-09-20 NOTE — ED Provider Notes (Signed)
[facility] EMERGENCY DEPARTMENT Provider Note   CSN: [id number] Arrival date & time: 09/20/19  1202     History Chief Complaint  Patient presents with  . Shortness of Breath  . Fall    Aaron [name] is a 64 y.o. male.  HPI   Patient presents to the ED for increasing weakness.  Patient has a history of end-stage renal disease.  He also has history of COPD, hypertension, and atrial fibrillation.  Patient was in the hospital back on May 10 for A. fib with RVR.  Patient was started on a Cardizem drip and ended up getting transferred to [facility].  Patient developed a small bowel obstruction and he underwent an exploratory laparotomy on May 17.  Patient had repeat ex lap on 524.  Patient was ultimately released from the hospital on May 30.  Patient states he has been feeling poorly and has not gone to dialysis since his discharge.  Patient denies any fevers.  He feels overall weak he is having some coughing and does feel short of breath.  Patient also thinks he might of stubbed his toe and now noticed a wound on the tip of his left foot.  Past Medical History:  Diagnosis Date  . Anxiety   . Arthritis    knees , back , shoulders (10/12/2017)  . Atrial fibrillation ([place])   . CAD (coronary artery disease)    Mild nonobstructive disease at cardiac catheterization 2002  . Chronic back pain    "back of my neck; down thru my legs" (10/12/2017)  . COPD (chronic obstructive pulmonary disease) ([place])   . Diverticulitis   . Esophageal reflux   . ESRD (end stage renal disease) on dialysis [facility])    "TTS; [place]" (11/23/2017)  . Essential hypertension   . Hepatitis C    states he no longer has this  . History of kidney stones   . History of syncope   . Hyperlipidemia   . Jerking 09/23/2014  . Myocardial infarction ([place]) 02/2017   "light one" (10/12/2017)  . Non-compliant behavior    non compliant with diaylsis per daughter  . PAT (paroxysmal atrial tachycardia) ([place])   . Peripheral  arterial disease ([place])    Occluded left superficial femoral artery status post stent June 2016 - Dr. Trula Slade  . Pneumonia 1961  . Polycystic kidney, unspecified type   . Syncope 09/2014  . SYNCOPE 05/07/2010   Qualifier: Diagnosis of  By: Laurance Flatten RN, BSN, [facility]      Patient Active Problem List   Diagnosis Date Noted  . Pain at surgical site   . Goals of care, counseling/discussion   . Palliative care encounter   . Atrial fibrillation with rapid ventricular response ([place])   . ESRD on peritoneal dialysis ([place])   . SBO (small bowel obstruction) ([place])   . DNR (do not resuscitate)   . Malnutrition of moderate degree 08/31/2019  . Acute exacerbation of CHF (congestive heart failure) ([place]) 08/23/2019  . Atrial fibrillation with RVR ([place]) 08/23/2019  . Exertional dyspnea 08/22/2019  . Hyperglycemia 05/27/2019  . Syncope 05/26/2019  . 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 Back pain 03/31/2018  . Noncompliance with medication regimen 03/31/2018  . CKD (chronic kidney disease) stage 5, GFR less than 15 ml/min (HCC) 02/03/2018  . COPD with acute exacerbation ([place]) 02/03/2018  . Atrial fibrillation, chronic ([place]) 02/03/2018  . Discitis 01/27/2018  . Discitis of lumbar region 01/09/2018  . Paraspinal abscess ([place]) 01/08/2018  . ESRD  on dialysis ([place]) 12/30/2017  . Hematoma of arm, right, initial encounter 11/23/2017  . Protein-calorie malnutrition, severe 10/15/2017  . Hyperlipidemia 08/24/2017  . Schatzki's ring of distal esophagus 04/27/2017  . Encounter for therapeutic drug monitoring 03/03/2017  . History of hepatitis C, naturally cleared  02/20/2017  . Ischemic cardiomyopathy 01/27/2017  . Dyslipidemia 01/27/2017  . AF (paroxysmal atrial fibrillation) ([place]) 01/27/2017  . CAD (coronary artery disease) 01/21/2017  . Polycystic kidney disease 12/14/2016  . Syncope, vasovagal 09/23/2014  . COPD (chronic obstructive pulmonary disease) ([place])   . COLD (chronic obstructive lung  disease) ([place])   . Depression   . PAD (peripheral artery disease) ([place]) 09/19/2014  . Preop cardiovascular exam 10/28/2010  . Tobacco abuse 10/28/2010  . UNSPECIFIED IRON DEFICIENCY ANEMIA 10/04/2009  . 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  . Essential hypertension 10/04/2009  . Esophageal reflux 10/04/2009  . PRECORDIAL PAIN 10/04/2009    Past Surgical History:  Procedure Laterality Date  . ABDOMINAL AORTOGRAM N/A 08/11/2017   Procedure: ABDOMINAL AORTOGRAM;  Surgeon: Serafina Mitchell, MD;  Location: [place] CV LAB;  Service: Cardiovascular;  Laterality: N/A;  . ANTERIOR CERVICAL DECOMP/DISCECTOMY FUSION    . APPLICATION OF WOUND VAC Right 08/14/2017   Procedure: APPLICATION OF PREVENA INCISIONAL WOUND VAC RIGHT GROIN;  Surgeon: Serafina Mitchell, MD;  Location: [facility] OR;  Service: Vascular;  Laterality: Right;  . AV FISTULA PLACEMENT Left 09/04/2017   Procedure: ARTERIOVENOUS (AV) FISTULA CREATION LEFT UPPER ARM;  Surgeon: Serafina Mitchell, MD;  Location: [place];  Service: Vascular;  Laterality: Left;  . BACK SURGERY    . BASCILIC VEIN TRANSPOSITION Left 11/20/2017   Procedure: SECOND STAGE BASILIC VEIN TRANSPOSITION LEFT ARM;  Surgeon: Serafina Mitchell, MD;  Location: [place];  Service: Vascular;  Laterality: Left;  . BIOPSY  12/17/2016   Procedure: BIOPSY;  Surgeon: Daneil Dolin, MD;  [facility];  Service: Gastroenterology;;  gastric colon  . BIOPSY  04/29/2017   Procedure: BIOPSY;  Surgeon: Daneil Dolin, MD;  [facility];  Service: Endoscopy;;  duodenal biopsies  . BUNIONECTOMY Bilateral   . CENTRAL VENOUS CATHETER INSERTION Right 08/29/2019   Procedure: Attempted Insertion Central Line Adult;  Surgeon: Jesusita Oka, MD;  Location: [place];  Service: General;  Laterality: Right;  . COLONOSCOPY  2008   Dr. Oneida Alar: rare sigmoid colon diverticulosis, internal hemorrhoids.   . COLONOSCOPY WITH PROPOFOL N/A 12/17/2016   dense left-sided diverticulosis, right colon ulcers  s/p biopsy query occult NSAID use vs transient ischemia, not consistent with IBD. CMV stains negative.   Marland Kitchen ENDARTERECTOMY FEMORAL Right 08/14/2017   Procedure: RIGHT ILLIO-FEMORAL ENDARTERECTOMY;  Surgeon: Serafina Mitchell, MD;  Location: [facility] OR;  Service: Vascular;  Laterality: Right;  . ESOPHAGEAL DILATION  12/17/2016   EGD with mild Schatzki's ring s/p dilatation, small hiatal hernia, erosive gastropathy (negative H.pylori gastritis)  . ESOPHAGOGASTRODUODENOSCOPY  2008   Dr. Oneida Alar: normal esophagus without Barrett's, antritis and duodenitis, path with H.pylori gastritis  . ESOPHAGOGASTRODUODENOSCOPY (EGD) WITH PROPOFOL N/A 12/17/2016   Procedure: ESOPHAGOGASTRODUODENOSCOPY (EGD) WITH PROPOFOL;  Surgeon: Daneil Dolin, MD;  [facility];  Service: Gastroenterology;  Laterality: N/A;  . ESOPHAGOGASTRODUODENOSCOPY (EGD) WITH PROPOFOL N/A 04/29/2017   Patchy erythema of gastric mucosa diffusely, extensive inflammatory changes in duodenum, geographic ulceration and mucosal edema present, encroaching somewhat on the lumen yet still widely patent, distal second portion of duodenum appeared abnormal, path with peptic duodenitis with ulceration  . GROIN  DEBRIDEMENT Right 10/14/2017   Procedure: RIGHT GROIN AND RIGHT LOWER QUADRANT ABDOMEN DEBRIDEMENT WITH PLACEMENT OF ANTIBIOTIC BEADS;  Surgeon: Serafina Mitchell, MD;  Location: [place];  Service: Vascular;  Laterality: Right;  . HEMATOMA EVACUATION Left 12/06/2017   Procedure: EVACUATION HEMATOMA;  Surgeon: Waynetta Sandy, MD;  Location: [place];  Service: Vascular;  Laterality: Left;  . HERNIA REPAIR  [id number]   umbilical  . I & D EXTREMITY Right 09/21/2017   Procedure: IRRIGATION AND DEBRIDEMENT GROIN;  Surgeon: Elam Dutch, MD;  Location: [place];  Service: Vascular;  Laterality: Right;  . I & D EXTREMITY Left 11/23/2017   Procedure: Evacuation Hematoma LEFT UPPER ARM GRAFT;  Surgeon: Elam Dutch, MD;  Location: [place];  Service:  Vascular;  Laterality: Left;  . INGUINAL HERNIA REPAIR Right 02/2017   [place]  . INSERTION OF DIALYSIS CATHETER Right 09/04/2017   Procedure: INSERTION OF TUNNELED  DIALYSIS CATHETER - RIGHT INTERNAL JUGULAR PLACEMENT;  Surgeon: Serafina Mitchell, MD;  Location: [place];  Service: Vascular;  Laterality: Right;  . INSERTION OF DIALYSIS CATHETER Right 07/26/2018   Procedure: INSERTION OF DIALYSIS CATHETER RIGHT INTERNAL JUGULAR;  Surgeon: Marty Heck, MD;  Location: [place];  Service: Vascular;  Laterality: Right;  . INSERTION OF ILIAC STENT Right 08/14/2017   Procedure: INSERTION OF RIGHT COMMON ILIAC STENT 46mm x 5mm x 130cm INSERTION OF RIGHT EXTERNAL ILIAC STENT 65mm x 48mm x 130cm INSERTION OF SUPERFICIAL FERMORAL ARTERY STENT 38mm x 66mm x 130cm;  Surgeon: Serafina Mitchell, MD;  Location: [place];  Service: Vascular;  Laterality: Right;  . IR FLUORO GUIDE CV LINE LEFT  08/31/2019  . IR FLUORO GUIDE CV LINE RIGHT  08/31/2017  . IR FLUORO GUIDE CV LINE RIGHT  08/30/2019  . IR REMOVAL TUN CV CATH W/O FL  05/28/2018  . IR REMOVAL TUN CV CATH W/O FL  03/07/2019  . IR US GUIDE VASC ACCESS LEFT  08/31/2019  . IR US GUIDE VASC ACCESS RIGHT  08/31/2017  . IR US GUIDE VASC ACCESS RIGHT  08/30/2019  . IRRIGATION AND DEBRIDEMENT ABDOMEN  09/05/2019   Procedure: Irrigation And Debridement Abdomen, Placement of retention sutures;  Surgeon: Donnie Mesa, MD;  Location: [place];  Service: General;;  . LAPAROTOMY N/A 08/29/2019   Procedure: EXPLORATORY LAPAROTOMY, REPAIR OF SEROSAL INJURY TIMES THREE;  Surgeon: Jesusita Oka, MD;  Location: [place];  Service: General;  Laterality: N/A;  . LAPAROTOMY N/A 09/05/2019   Procedure: EXPLORATORY LAPAROTOMY;  Surgeon: Donnie Mesa, MD;  Location: [place];  Service: General;  Laterality: N/A;  . LOWER EXTREMITY ANGIOGRAPHY Right 08/11/2017   Procedure: LOWER EXTREMITY ANGIOGRAPHY;  Surgeon: Serafina Mitchell, MD;  Location: [place] CV LAB;  Service: Cardiovascular;   Laterality: Right;  . LYSIS OF ADHESION  08/29/2019   Procedure: Lysis Of Adhesion;  Surgeon: Jesusita Oka, MD;  Location: [place];  Service: General;;  . PATCH ANGIOPLASTY Right 08/14/2017   Procedure: PATCH ANGIOPLASTY USING HEMASHIELD PATCH 0.3IN Lillie [place];  Surgeon: Serafina Mitchell, MD;  Location: [place] OR;  Service: Vascular;  Laterality: Right;  . PERIPHERAL VASCULAR CATHETERIZATION N/A 09/20/2014   Procedure: Abdominal Aortogram;  Surgeon: Serafina Mitchell, MD;  Location: [place] CV LAB;  Service: Cardiovascular;  Laterality: N/A;  . POSTERIOR FUSION LUMBAR SPINE    . TEE WITHOUT CARDIOVERSION N/A 01/12/2018   Procedure: TRANSESOPHAGEAL ECHOCARDIOGRAM (TEE);  Surgeon: Lelon Perla, MD;  Location: [facility] ENDOSCOPY;  Service: Cardiovascular;  Laterality: N/A;       Family History  Problem Relation Age of Onset  . Alcoholism Mother   . Heart disease Father        Massive heart attack  . Heart attack Father   . Atrial fibrillation Father   . Colon cancer Father   . Colon cancer Maternal Grandfather 40  . Alcoholism Maternal Grandfather   . Renal cancer Cousin   . Ovarian cancer Sister     Social History   Tobacco Use  . Smoking status: Current Every Day Smoker    Packs/day: 0.25    Years: 47.00    Pack years: 11.75    Types: Cigarettes    Last attempt to quit: 08/03/2017    Years since quitting: 2.1  . Smokeless tobacco: Never Used  Substance Use Topics  . Alcohol use: Not Currently    Comment: 10/12/2017 alcohol free since 2017,  heavy drinker in the past  . Drug use: Not Currently    Comment: "<2003 whatever was around; nothing since"    Home Medications Prior to Admission medications   Medication Sig Start Date End Date Taking? Authorizing Provider  atorvastatin (LIPITOR) 80 MG tablet Take 80 mg by mouth daily. 04/25/19  Yes [provider]  pantoprazole (PROTONIX) 40 MG tablet Take 1 tablet (40 mg total) by mouth 2 (two) times daily. Patient taking  differently: Take 40 mg by mouth daily.  04/30/17 01/27/26 Yes Shah, Pratik D, DO  albuterol (PROVENTIL HFA;VENTOLIN HFA) 108 (90 Base) MCG/ACT inhaler Inhale 2 puffs into the lungs every 6 (six) hours as needed for wheezing or shortness of breath.    [provider]  albuterol (PROVENTIL) (2.5 MG/3ML) 0.083% nebulizer solution Inhale 3 mLs into the lungs 3 (three) times daily as needed for shortness of breath or wheezing. 06/23/18   [provider]  apixaban (ELIQUIS) 5 MG TABS tablet Take 1 tablet (5 mg total) by mouth 2 (two) times daily. 09/11/19   Mercy Riding, MD  BREO ELLIPTA 200-25 MCG/INH AEPB Inhale 1 puff into the lungs daily as needed (for respiratory issues.).  07/27/17   [provider]  calcium acetate (PHOSLO) 667 MG capsule Take 667 mg by mouth 3 (three) times daily with meals. 06/29/18   [provider]  Darbepoetin Alfa (ARANESP) 60 MCG/0.3ML SOSY injection Inject 0.3 mLs (60 mcg total) into the vein every Wednesday with hemodialysis. 09/14/19   Mercy Riding, MD  docusate sodium (COLACE) 100 MG capsule Take 1 capsule (100 mg total) by mouth daily. 09/11/19   Mercy Riding, MD  furosemide (LASIX) 40 MG tablet Take 1 tablet (40 mg total) by mouth daily as needed for fluid or edema (Shortness of breath). 09/11/19   Mercy Riding, MD  gabapentin (NEURONTIN) 300 MG capsule Take 300 mg by mouth 2 (two) times daily. 07/07/18   [provider]  metoprolol tartrate (LOPRESSOR) 25 MG tablet Take 1 tablet (25 mg total) by mouth 2 (two) times daily. 09/11/19   Mercy Riding, MD  multivitamin (RENA-VIT) TABS tablet Take 1 tablet by mouth at bedtime. 09/11/19   Mercy Riding, MD  ondansetron (ZOFRAN) 4 MG tablet Take 1 tablet (4 mg total) by mouth every 8 (eight) hours as needed for nausea. 09/11/19   Mercy Riding, MD  oxyCODONE-acetaminophen (PERCOCET) 10-325 MG tablet Take 1 tablet by mouth 5 (five) times daily as needed for pain.     [provider]  senna-docusate (SENOKOT-S) 8.6-50 MG tablet Take 1 tablet by mouth 2 (two) times daily as needed for moderate constipation. 09/11/19   Mercy Riding, MD  varenicline (CHANTIX) 0.5 MG tablet Take 0.5 mg by mouth 2 (two) times daily.    [provider]    Allergies    Patient has no known allergies.  Review of Systems   Review of Systems  All other systems reviewed and are negative.   Physical Exam Updated Vital Signs BP 121/87   Pulse 75   Temp 99 F (37.2 C) (Oral)   Resp 16   Ht 1.93 m (6\' 4" )   Wt 86.6 kg   SpO2 92%   BMI 23.25 kg/m   Physical Exam Vitals and nursing note reviewed.  Constitutional:      General: He is in acute distress.     Appearance: He is ill-appearing.  HENT:     Head: Normocephalic and atraumatic.     Right Ear: External ear normal.     Left Ear: External ear normal.  Eyes:     General: No scleral icterus.       Right eye: No discharge.        Left eye: No discharge.     Conjunctiva/sclera: Conjunctivae normal.  Neck:     Trachea: No tracheal deviation.  Cardiovascular:     Rate and Rhythm: Regular rhythm. Tachycardia present.  Pulmonary:     Effort: Pulmonary effort is normal. No respiratory distress.     Breath sounds: No stridor. Rhonchi present. No wheezing or rales.  Abdominal:     General: Bowel sounds are normal. There is no distension.     Palpations: Abdomen is soft.     Tenderness: There is no abdominal tenderness. There is no guarding or rebound.  Musculoskeletal:        General: No tenderness.     Cervical back: Neck supple.     Comments: Poor perfusion and diminished cap refill in bilateral lower extremities, mottled skin ulcerative wound at the left great toe, questionable necrosis  Skin:    General: Skin is warm and dry.     Findings: No rash.  Neurological:     Mental Status: He is alert.     Cranial Nerves: No cranial nerve deficit (no facial droop, extraocular movements intact, no slurred speech).      Sensory: No sensory deficit.     Motor: No abnormal muscle tone or seizure activity.     Coordination: Coordination normal.     Comments: Generalized weakness     ED Results / Procedures / Treatments   Labs (all labs ordered are listed, but only abnormal results are displayed) Labs Reviewed  CBC WITH DIFFERENTIAL/PLATELET - Abnormal; Notable for the following components:      Result Value   WBC 12.6 (*)    RBC 3.19 (*)    Hemoglobin 9.9 (*)    HCT 31.1 (*)    RDW 15.9 (*)    Platelets 594 (*)    Neutro Abs 10.8 (*)    Abs Immature Granulocytes 0.11 (*)    All other components within normal limits  COMPREHENSIVE METABOLIC PANEL - Abnormal; Notable for the following components:   Sodium 130 (*)    Potassium >7.5 (*)    Chloride 96 (*)    CO2 18 (*)    BUN 108 (*)    Creatinine, Ser 10.00 (*)    Calcium 8.1 (*)    Total Protein 6.3 (*)  Albumin 2.0 (*)    AST 48 (*)    Alkaline Phosphatase 148 (*)    GFR calc non Af Amer 5 (*)    GFR calc Af Amer 6 (*)    Anion gap 16 (*)    All other components within normal limits  TROPONIN I (HIGH SENSITIVITY) - Abnormal; Notable for the following components:   Troponin I (High Sensitivity) 29 (*)    All other components within normal limits  CULTURE, BLOOD (ROUTINE X 2)  CULTURE, BLOOD (ROUTINE X 2)  SARS CORONAVIRUS 2 BY RT PCR (HOSPITAL ORDER, [place] [facility])  CBG MONITORING, ED  TROPONIN I (HIGH SENSITIVITY)    EKG EKG Interpretation  Date/Time:  Tuesday September 20 2019 12:34:54 EDT Ventricular Rate:  131 PR Interval:    QRS Duration: 185 QT Interval:  384 QTC Calculation: 567 R Axis:   -90 Text Interpretation: Sinus tachycardia Nonspecific IVCD with LAD Left ventricular hypertrophy wide complex rhythm new since last tracing Confirmed by Dorie Rank [id number]) on 09/20/2019 12:44:35 PM   EKG Interpretation  Date/Time:  Tuesday September 20 2019 14:03:37 EDT Ventricular Rate:  109 PR Interval:    QRS  Duration: 162 QT Interval:  362 QTC Calculation: 488 R Axis:   -77 Text Interpretation: Atrial fibrillation Nonspecific IVCD with LAD LVH with secondary repolarization abnormality Anterior infarct, old qrs interval decreased Confirmed by Dorie Rank [phone]) on 09/20/2019 2:33:17 PM       Radiology DG Abdomen 1 View  Result Date: 09/20/2019 CLINICAL DATA:  Abdominal distension EXAM: ABDOMEN - 1 VIEW COMPARISON:  09/03/2019 FINDINGS: Nonobstructive bowel gas pattern. Moderate volume of stool projects throughout the colon. Catheter tubing overlies the central abdomen. Peritoneal catheter tubing within the central pelvis. Right iliac vascular stent. Severe arthropathy of the right hip joint. The visualized portion of the chest demonstrates a large bore catheter terminating at the level of the superior cavoatrial junction. IMPRESSION: Nonobstructive bowel gas pattern. Moderate volume of stool throughout the colon. Electronically Signed   By: Davina Poke D.O.   On: 09/20/2019 14:35   DG Chest Portable 1 View  Result Date: 09/20/2019 CLINICAL DATA:  Weakness.  Increased shortness of breath. EXAM: PORTABLE CHEST 1 VIEW COMPARISON:  09/04/2019 FINDINGS: Increased densities at the left lung base compared to the previous examination. Left jugular dialysis catheter is stable with the tip at the SVC and right atrium junction. Lucency in both lungs compatible with chronic lung changes. Heart size is upper limits of normal and stable. Right costophrenic angle is not completely imaged on this examination. Surgical plate in the cervical spine. IMPRESSION: Left basilar chest densities. Findings are nonspecific but could represent atelectasis, pleural fluid or even infection. Stable appearance of the dialysis catheter. Electronically Signed   By: Markus Daft M.D.   On: 09/20/2019 14:03    Procedures .Critical Care Performed by: Dorie Rank, MD Authorized by: Dorie Rank, MD   Critical care provider statement:     Critical care time (minutes):  45   Critical care was time spent personally by me on the following activities:  Discussions with consultants, evaluation of patient's response to treatment, examination of patient, ordering and performing treatments and interventions, ordering and review of laboratory studies, ordering and review of radiographic studies, pulse oximetry, re-evaluation of patient's condition, obtaining history from patient or surrogate and review of old charts   (including critical care time)  Medications Ordered in ED Medications  albuterol (PROVENTIL) (2.5 MG/3ML) 0.083% nebulizer  solution 10 mg (has no administration in time range)  calcium gluconate 1 g/ 50 mL sodium chloride IVPB (has no administration in time range)  calcium gluconate 1 g/ 50 mL sodium chloride IVPB (0 g Intravenous Stopped 09/20/19 1403)  insulin aspart (novoLOG) injection 5 Units (5 Units Intravenous Given 09/20/19 1333)    And  dextrose 50 % solution 50 mL (50 mLs Intravenous Given 09/20/19 1332)  sodium bicarbonate injection 50 mEq (50 mEq Intravenous Given 09/20/19 1334)    ED Course  I have reviewed the triage vital signs and the nursing notes.  Pertinent labs & imaging results that were available during my care of the patient were reviewed by me and considered in my medical decision making (see chart for details).  Clinical Course as of Sep 19 1541  Tue Sep 20, 2019  1308 Patient's EKG is notable for a wide-complex tachycardia.  This is new compared to previous values.  This is concerning for hyperkalemia.  I will empirically start the patient on a hyperkalemia regimen   [JK]  1349 Leukocytosis noted.  Anemia stable   [JK]  1432 Widened QRS has decreased on repeat EKG   [JK]  1508 Notified that patient's potassium is greater than 7.5.  This lab was drawn prior to his empiric treatment for hyperkalemia   [JK]  1537 Case discussed with Dr Joelyn Oms.  Will plan on dialyzing patient.   [JK]    Clinical  Course User Index [JK] Dorie Rank, MD   MDM Rules/Calculators/A&P                      Patient presented with increasing weakness after recently being hospitalized.  Patient has not been to dialysis since and left the hospital.  Patient had a wide-complex tachycardic rhythm on arrival.  Patient has known A. fib.  Findings were concerning for acute hyperkalemia.  Patient was treated empirically with improvement notable on his EKG.  Patient's laboratory tests do confirm the suspicion.  The patient's potassium is greater than 7.5.  Levator troponin likely related to his renal insufficiency.  Chest x-ray and abdominal x-ray without acute findings.  Patient has been treated with calcium gluconate, insulin, dextrose, sodium bicarb and albuterol.  Case discussed with Dr. Joelyn Oms nephrology.  Plan on dialysis today.  I will consult the medical service for admission. Final Clinical Impression(s) / ED Diagnoses Final diagnoses:  Hyperkalemia  Stage 5 chronic kidney disease on chronic dialysis [facility])      Dorie Rank, MD 09/20/19 [phone]

## 2019-09-20 NOTE — H&P (Addendum)
History and Physical    Aaron [name]:[id number] DOB: 1956-02-04 DOA: 09/20/2019  PCP: Aaron [name]   Patient coming from: Home  I have personally briefly reviewed patient's old medical records in Aaron [name]  Chief Complaint: Difficulty breathing  HPI: Aaron [name] is a 64 y.o. male with medical history significant for ESRD, atrial fibrillation, COPD, congestive heart failure.  Patient presented to the ED with complaints of difficulty breathing, cough, and reports that he fell and blacked out.  He reports that he has been having difficulty breathing for the past 2 month, with cough productive of yellowish sputum also over the past 2 months.  He tells me he feels like he is suffocating, and the nasal cannula helps. Patient missed 4 HD sessions, because of increasing pain, from his surgical incision in his abdomen.  He also reports abdominal distention.  Recent prolonged hospitalization 5/10 - 5/30-was admitted to [facility] for ESRD on peritoneal dialysis, initially admitted for atrial fibrillation with RVR, volume overload COPD exacerbation.  Stay was complicated by a small bowel obstruction secondary to adhesions, patient had exploratory laparotomy 5/17, with repeat 5/24.  He was subsequently changed from peritoneal dialysis to hemodialysis Monday Wednesday Friday for 1 to 2 months after surgery.  Discharged home on 7-day course of Keflex for cellulitis.  ED Course: Heart rate up to 131, T-max 99, O2 sats 85% on room air, placed on 4 L with sats 92 to 99%.  Severe hyperkalemia greater than 7.5, BUN 108, sodium 130, serum bicarb 18, anion gap of 16, WBC 12.6.  Troponin 29 > 28.  Chest x-ray shows left basilar chest densities-atelectasis versus pleural fluid versus infection.  Abdominal x-ray-nonobstructive bowel gas pattern, moderate stool volume. EDP talked to nephrologist, urgent HD was started in ED.  Hospitalist admit for further evaluation and  management.  Review of Systems: As per HPI all other systems reviewed and negative.  Past Medical History:  Diagnosis Date  . Anxiety   . Arthritis    knees , back , shoulders (10/12/2017)  . Atrial fibrillation ([place])   . CAD (coronary artery disease)    Mild nonobstructive disease at cardiac catheterization 2002  . Chronic back pain    "back of my neck; down thru my legs" (10/12/2017)  . COPD (chronic obstructive pulmonary disease) ([place])   . Diverticulitis   . Esophageal reflux   . ESRD (end stage renal disease) on dialysis [facility])    "TTS; [place]" (11/23/2017)  . Essential hypertension   . Hepatitis C    states he no longer has this  . History of kidney stones   . History of syncope   . Hyperlipidemia   . Jerking 09/23/2014  . Myocardial infarction ([place]) 02/2017   "light one" (10/12/2017)  . Non-compliant behavior    non compliant with diaylsis per daughter  . PAT (paroxysmal atrial tachycardia) ([place])   . Peripheral arterial disease ([place])    Occluded left superficial femoral artery status post stent June 2016 - Dr. Trula Slade  . Pneumonia 1961  . Polycystic kidney, unspecified type   . Syncope 09/2014  . SYNCOPE 05/07/2010   Qualifier: Diagnosis of  By: Laurance Flatten RN, BSN, Anderson Malta      Past Surgical History:  Procedure Laterality Date  . ABDOMINAL AORTOGRAM N/A 08/11/2017   Procedure: ABDOMINAL AORTOGRAM;  Surgeon: Serafina Mitchell, MD;  Location: [place] [facility];  Service: Cardiovascular;  Laterality: N/A;  . ANTERIOR CERVICAL DECOMP/DISCECTOMY FUSION    .  APPLICATION OF WOUND VAC Right 08/14/2017   Procedure: APPLICATION OF PREVENA INCISIONAL WOUND VAC RIGHT GROIN;  Surgeon: Serafina Mitchell, MD;  Location: [facility] OR;  Service: Vascular;  Laterality: Right;  . AV FISTULA PLACEMENT Left 09/04/2017   Procedure: ARTERIOVENOUS (AV) FISTULA CREATION LEFT UPPER ARM;  Surgeon: Serafina Mitchell, MD;  Location: [place];  Service: Vascular;  Laterality: Left;  . BACK SURGERY    . BASCILIC VEIN  TRANSPOSITION Left 11/20/2017   Procedure: SECOND STAGE BASILIC VEIN TRANSPOSITION LEFT ARM;  Surgeon: Serafina Mitchell, MD;  Location: [place];  Service: Vascular;  Laterality: Left;  . BIOPSY  12/17/2016   Procedure: BIOPSY;  Surgeon: Daneil Dolin, MD;  [facility];  Service: Gastroenterology;;  gastric colon  . BIOPSY  04/29/2017   Procedure: BIOPSY;  Surgeon: Daneil Dolin, MD;  [facility];  Service: Endoscopy;;  duodenal biopsies  . BUNIONECTOMY Bilateral   . CENTRAL VENOUS CATHETER INSERTION Right 08/29/2019   Procedure: Attempted Insertion Central Line Adult;  Surgeon: Jesusita Oka, MD;  Location: [place];  Service: General;  Laterality: Right;  . COLONOSCOPY  2008   Dr. Oneida Alar: rare sigmoid colon diverticulosis, internal hemorrhoids.   . COLONOSCOPY WITH PROPOFOL N/A 12/17/2016   dense left-sided diverticulosis, right colon ulcers s/p biopsy query occult NSAID use vs transient ischemia, not consistent with IBD. CMV stains negative.   Marland Kitchen ENDARTERECTOMY FEMORAL Right 08/14/2017   Procedure: RIGHT ILLIO-FEMORAL ENDARTERECTOMY;  Surgeon: Serafina Mitchell, MD;  Location: [facility] OR;  Service: Vascular;  Laterality: Right;  . ESOPHAGEAL DILATION  12/17/2016   EGD with mild Schatzki's ring s/p dilatation, small hiatal hernia, erosive gastropathy (negative H.pylori gastritis)  . ESOPHAGOGASTRODUODENOSCOPY  2008   Dr. Oneida Alar: normal esophagus without Barrett's, antritis and duodenitis, path with H.pylori gastritis  . ESOPHAGOGASTRODUODENOSCOPY (EGD) WITH PROPOFOL N/A 12/17/2016   Procedure: ESOPHAGOGASTRODUODENOSCOPY (EGD) WITH PROPOFOL;  Surgeon: Daneil Dolin, MD;  [facility];  Service: Gastroenterology;  Laterality: N/A;  . ESOPHAGOGASTRODUODENOSCOPY (EGD) WITH PROPOFOL N/A 04/29/2017   Patchy erythema of gastric mucosa diffusely, extensive inflammatory changes in duodenum, geographic ulceration and mucosal edema present, encroaching somewhat on the lumen yet still  widely patent, distal second portion of duodenum appeared abnormal, path with peptic duodenitis with ulceration  . GROIN DEBRIDEMENT Right 10/14/2017   Procedure: RIGHT GROIN AND RIGHT LOWER QUADRANT ABDOMEN DEBRIDEMENT WITH PLACEMENT OF ANTIBIOTIC BEADS;  Surgeon: Serafina Mitchell, MD;  Location: [place];  Service: Vascular;  Laterality: Right;  . HEMATOMA EVACUATION Left 12/06/2017   Procedure: EVACUATION HEMATOMA;  Surgeon: Waynetta Sandy, MD;  Location: [place];  Service: Vascular;  Laterality: Left;  . HERNIA REPAIR  7106   umbilical  . I & D EXTREMITY Right 09/21/2017   Procedure: IRRIGATION AND DEBRIDEMENT GROIN;  Surgeon: Elam Dutch, MD;  Location: [place];  Service: Vascular;  Laterality: Right;  . I & D EXTREMITY Left 11/23/2017   Procedure: Evacuation Hematoma LEFT UPPER ARM GRAFT;  Surgeon: Elam Dutch, MD;  Location: [place];  Service: Vascular;  Laterality: Left;  . INGUINAL HERNIA REPAIR Right 02/2017   [place]  . INSERTION OF DIALYSIS CATHETER Right 09/04/2017   Procedure: INSERTION OF TUNNELED  DIALYSIS CATHETER - RIGHT INTERNAL JUGULAR PLACEMENT;  Surgeon: Serafina Mitchell, MD;  Location: [place];  Service: Vascular;  Laterality: Right;  . INSERTION OF DIALYSIS CATHETER Right 07/26/2018   Procedure: INSERTION OF DIALYSIS CATHETER RIGHT INTERNAL JUGULAR;  Surgeon: Marty Heck, MD;  Location: [place];  Service: Vascular;  Laterality: Right;  . INSERTION OF ILIAC STENT Right 08/14/2017   Procedure: INSERTION OF RIGHT COMMON ILIAC STENT 40mm x 4mm x 130cm INSERTION OF RIGHT EXTERNAL ILIAC STENT 68mm x 44mm x 130cm INSERTION OF SUPERFICIAL FERMORAL ARTERY STENT 44mm x 67mm x 130cm;  Surgeon: Serafina Mitchell, MD;  Location: [place];  Service: Vascular;  Laterality: Right;  . IR FLUORO GUIDE CV LINE LEFT  08/31/2019  . IR FLUORO GUIDE CV LINE RIGHT  08/31/2017  . IR FLUORO GUIDE CV LINE RIGHT  08/30/2019  . IR REMOVAL TUN CV CATH W/O FL  05/28/2018  . IR REMOVAL TUN CV CATH  W/O FL  03/07/2019  . IR US GUIDE VASC ACCESS LEFT  08/31/2019  . IR US GUIDE VASC ACCESS RIGHT  08/31/2017  . IR US GUIDE VASC ACCESS RIGHT  08/30/2019  . IRRIGATION AND DEBRIDEMENT ABDOMEN  09/05/2019   Procedure: Irrigation And Debridement Abdomen, Placement of retention sutures;  Surgeon: Donnie Mesa, MD;  Location: [place];  Service: General;;  . LAPAROTOMY N/A 08/29/2019   Procedure: EXPLORATORY LAPAROTOMY, REPAIR OF SEROSAL INJURY TIMES THREE;  Surgeon: Jesusita Oka, MD;  Location: [place];  Service: General;  Laterality: N/A;  . LAPAROTOMY N/A 09/05/2019   Procedure: EXPLORATORY LAPAROTOMY;  Surgeon: Donnie Mesa, MD;  Location: [place];  Service: General;  Laterality: N/A;  . LOWER EXTREMITY ANGIOGRAPHY Right 08/11/2017   Procedure: LOWER EXTREMITY ANGIOGRAPHY;  Surgeon: Serafina Mitchell, MD;  Location: [place] [facility];  Service: Cardiovascular;  Laterality: Right;  . LYSIS OF ADHESION  08/29/2019   Procedure: Lysis Of Adhesion;  Surgeon: Jesusita Oka, MD;  Location: [place];  Service: General;;  . PATCH ANGIOPLASTY Right 08/14/2017   Procedure: PATCH ANGIOPLASTY USING HEMASHIELD PATCH 0.3IN Lillie Columbia;  Surgeon: Serafina Mitchell, MD;  Location: [place] OR;  Service: Vascular;  Laterality: Right;  . PERIPHERAL VASCULAR CATHETERIZATION N/A 09/20/2014   Procedure: Abdominal Aortogram;  Surgeon: Serafina Mitchell, MD;  Location: [place] [facility];  Service: Cardiovascular;  Laterality: N/A;  . POSTERIOR FUSION LUMBAR SPINE    . TEE WITHOUT CARDIOVERSION N/A 01/12/2018   Procedure: TRANSESOPHAGEAL ECHOCARDIOGRAM (TEE);  Surgeon: Lelon Perla, MD;  Location: [facility] ENDOSCOPY;  Service: Cardiovascular;  Laterality: N/A;     reports that he has been smoking cigarettes. He has a 11.75 pack-year smoking history. He has never used smokeless tobacco. He reports previous alcohol use. He reports previous drug use.  No Known Allergies  Family History  Problem Relation Age of Onset  . Alcoholism Mother   .  Heart disease Father        Massive heart attack  . Heart attack Father   . Atrial fibrillation Father   . Colon cancer Father   . Colon cancer Maternal Grandfather 67  . Alcoholism Maternal Grandfather   . Renal cancer Cousin   . Ovarian cancer Sister     Prior to Admission medications   Medication Sig Start Date End Date Taking? Authorizing Provider  atorvastatin (LIPITOR) 80 MG tablet Take 80 mg by mouth daily. 04/25/19  Yes [provider]  pantoprazole (PROTONIX) 40 MG tablet Take 1 tablet (40 mg total) by mouth 2 (two) times daily. Patient taking differently: Take 40 mg by mouth daily.  04/30/17 01/27/26 Yes Shah, Pratik D, DO  albuterol (PROVENTIL HFA;VENTOLIN HFA) 108 (90 Base) MCG/ACT inhaler Inhale 2 puffs into the  lungs every 6 (six) hours as needed for wheezing or shortness of breath.    [provider]  albuterol (PROVENTIL) (2.5 MG/3ML) 0.083% nebulizer solution Inhale 3 mLs into the lungs 3 (three) times daily as needed for shortness of breath or wheezing. 06/23/18   [provider]  apixaban (ELIQUIS) 5 MG TABS tablet Take 1 tablet (5 mg total) by mouth 2 (two) times daily. 09/11/19   Mercy Riding, MD  BREO ELLIPTA 200-25 MCG/INH AEPB Inhale 1 puff into the lungs daily as needed (for respiratory issues.).  07/27/17   [provider]  calcium acetate (PHOSLO) 667 MG capsule Take 667 mg by mouth 3 (three) times daily with meals. 06/29/18   [provider]  Darbepoetin Alfa (ARANESP) 60 MCG/0.3ML SOSY injection Inject 0.3 mLs (60 mcg total) into the vein every Wednesday with hemodialysis. 09/14/19   Mercy Riding, MD  docusate sodium (COLACE) 100 MG capsule Take 1 capsule (100 mg total) by mouth daily. 09/11/19   Mercy Riding, MD  furosemide (LASIX) 40 MG tablet Take 1 tablet (40 mg total) by mouth daily as needed for fluid or edema (Shortness of breath). 09/11/19   Mercy Riding, MD  gabapentin (NEURONTIN) 300 MG capsule Take 300 mg by mouth  2 (two) times daily. 07/07/18   [provider]  metoprolol tartrate (LOPRESSOR) 25 MG tablet Take 1 tablet (25 mg total) by mouth 2 (two) times daily. 09/11/19   Mercy Riding, MD  multivitamin (RENA-VIT) TABS tablet Take 1 tablet by mouth at bedtime. 09/11/19   Mercy Riding, MD  ondansetron (ZOFRAN) 4 MG tablet Take 1 tablet (4 mg total) by mouth every 8 (eight) hours as needed for nausea. 09/11/19   Mercy Riding, MD  oxyCODONE-acetaminophen (PERCOCET) 10-325 MG tablet Take 1 tablet by mouth 5 (five) times daily as needed for pain.     [provider]  senna-docusate (SENOKOT-S) 8.6-50 MG tablet Take 1 tablet by mouth 2 (two) times daily as needed for moderate constipation. 09/11/19   Mercy Riding, MD  varenicline (CHANTIX) 0.5 MG tablet Take 0.5 mg by mouth 2 (two) times daily.    [provider]    Physical Exam: Vitals:   09/20/19 1232 09/20/19 1235 09/20/19 1235 09/20/19 1500  BP: (!) 148/88 (!) 148/88  121/87  Pulse:  (!) 131  75  Resp: (!) 33 (!) 24  16  Temp:  99 F (37.2 C)    TempSrc:  Oral    SpO2:  (!) 85% 99% 92%  Weight:      Height:        Constitutional: NAD, calm, comfortable Vitals:   09/20/19 1232 09/20/19 1235 09/20/19 1235 09/20/19 1500  BP: (!) 148/88 (!) 148/88  121/87  Pulse:  (!) 131  75  Resp: (!) 33 (!) 24  16  Temp:  99 F (37.2 C)    TempSrc:  Oral    SpO2:  (!) 85% 99% 92%  Weight:      Height:       Eyes: PERRL, lids and conjunctivae normal ENMT: Mucous membranes are moist. Posterior pharynx clear of any exudate or lesions.  Neck: normal, supple, no masses, no thyromegaly Respiratory: Upper airway transmitted sounds, lungs sound congested, no wheezing, Normal respiratory effort. No accessory muscle use.  Cardiovascular: Tachycardic, irregular rate and rhythm, no murmurs / rubs / gallops. No extremity edema. 2+ pedal pulses.   Abdomen: Abdomen distended, tenderness mostly around surgical  site, purulent discharge from  wound, and on abdominal dressing.  Musculoskeletal: no clubbing / cyanosis. No joint deformity upper and lower extremities. Good ROM, no contractures.  Skin: Wound on abdomen, wound on left big toe, Neurologic: Moving extremities spontaneously, no apparent cranial nerve abnormality. Psychiatric: Normal judgment and insight. Alert and oriented x 3. Normal mood.       Labs on Admission: I have personally reviewed following labs and imaging studies  CBC: Recent Labs  Lab 09/20/19 1306  WBC 12.6*  NEUTROABS 10.8*  HGB 9.9*  HCT 31.1*  MCV 97.5  PLT 423*   Basic Metabolic Panel: Recent Labs  Lab 09/20/19 1306  NA 130*  K >7.5*  CL 96*  CO2 18*  GLUCOSE 87  BUN 108*  CREATININE 10.00*  CALCIUM 8.1*   Liver Function Tests: Recent Labs  Lab 09/20/19 1306  AST 48*  ALT 8  ALKPHOS 148*  BILITOT 0.5  PROT 6.3*  ALBUMIN 2.0*   CBG: Recent Labs  Lab 09/20/19 1522  GLUCAP 72    Radiological Exams on Admission: DG Abdomen 1 View  Result Date: 09/20/2019 CLINICAL DATA:  Abdominal distension EXAM: ABDOMEN - 1 VIEW COMPARISON:  09/03/2019 FINDINGS: Nonobstructive bowel gas pattern. Moderate volume of stool projects throughout the colon. Catheter tubing overlies the central abdomen. Peritoneal catheter tubing within the central pelvis. Right iliac vascular stent. Severe arthropathy of the right hip joint. The visualized portion of the chest demonstrates a large bore catheter terminating at the level of the superior cavoatrial junction. IMPRESSION: Nonobstructive bowel gas pattern. Moderate volume of stool throughout the colon. Electronically Signed   By: Davina Poke D.O.   On: 09/20/2019 14:35   DG Chest Portable 1 View  Result Date: 09/20/2019 CLINICAL DATA:  Weakness.  Increased shortness of breath. EXAM: PORTABLE CHEST 1 VIEW COMPARISON:  09/04/2019 FINDINGS: Increased densities at the left lung base compared to the previous examination. Left jugular dialysis catheter  is stable with the tip at the SVC and right atrium junction. Lucency in both lungs compatible with chronic lung changes. Heart size is upper limits of normal and stable. Right costophrenic angle is not completely imaged on this examination. Surgical plate in the cervical spine. IMPRESSION: Left basilar chest densities. Findings are nonspecific but could represent atelectasis, pleural fluid or even infection. Stable appearance of the dialysis catheter. Electronically Signed   By: Markus Daft M.D.   On: 09/20/2019 14:03    EKG: Independently reviewed.  Initial EKG showed wide complex QRS tachycardia rate 131, prolonged QTC of 567 and QRS of 185.  After hyperkalemia cocktail, EKG improved, showing atrial fibrillation rate 109, QTC of 488, QRS of 162.  Assessment/Plan Principal Problem:   Hyperkalemia Active Problems:   COPD (chronic obstructive pulmonary disease) (HCC)   AF (paroxysmal atrial fibrillation) (HCC)   ESRD on dialysis [facility])   Atrial fibrillation, chronic (HCC)   Hyperkalemia, ESRD- >  7.5, due to several missed hemodialysis sessions- 4.  With EKG abnormalities of wide QRS complexes.  Previously on peritoneal dialysis, switched to hemodialysis Monday Wednesday Friday, for 1 to 2 months due to abdominal surgery.  He still makes urine. -Hyperkalemia cocktail, albuterol nebulizer, calcium gluconate x2, insulin and D50 and sodium bicarbonate given in ED -Nephrology consulted, urgent hemodialysis initiated in the ED. -Repeat BMP -Please reconsult nephrology in the morning at [facility].  Atrial fibrillation with RVR-heart rate increased to 160s. Likely provoked by electrolyte derangements-severe hyperkalemia, and not taking home Cardizem, metoprolol today. -10  mg Cardizem bolus, start Cardizem drip -Hold Eliquis for now pending general surgery evaluation -Hold Cardizem and metoprolol while on drip, n.p.o. status midnight  Possible Pneumonia- dyspnea, cough, T-max 99, WBC 12.6.  Portable  chest x-ray showing left basilar chest densities-atelectasis versus pleural fluid or infection.   Low-grade fever, mild leukocytosis and procalcitonin may be 2/2 abdominal skin infection.  -IV ceftriaxone and doxycycline (QTC prolonged), for now -BMP,CBC a.m.  -Mucolytic's, supplemental O2  Recent small bowel obstruction, infected surgical wound-with purulent drainage, bowel surgery 5/17 and repeat 5/24.  Surgical site dehiscence likely from increased abdominal tension- coughing and abdominal distention.  He was discharged home 5/30 to complete a 7-day course of Keflex, for abdominal cellulitis. - I talked to general surgeon Dr. Donne Hazel, patient will be seen at [place] order instruction to page general surgery on arrival to [facility]. -IV ceftriaxone and doxycycline -N.p.o. midnight -Hold home Eliquis  Decompensated diastolic CHF-due to several missed HD sessions.  Last echo 2021 EF 50 to 51%. -  Per HD.   COPD-stable. -DuoNebs as needed  Prolonged QTC-567.  Likely from severe hyperkalemia. -Check magnesium -EKG in the morning  Fall- reports he passed out after fall, pain and wound sustained to left big toe.  -Head CT as he is on anticoagulation. - Left foot xray   DVT prophylaxis: SCDs for now Code Status: DNR, reconfirmed with patient at bedside, consistent with prior documentation in chart. Family Communication: None at bedside Disposition Plan: > 2 days Consults called: General surgery.  Please reconsult nephrology tomorrow on arrival to [facility]. Admission status: Inpatient, stepdown I certify that at the point of admission it is my clinical judgment that the patient will require inpatient hospital care spanning beyond 2 midnights from the point of admission due to high intensity of service, high risk for further deterioration and high frequency of surveillance required. The following factors support the patient status of inpatient: Multiple medical problems  requiring IV medications and inpatient status.   Bethena Roys MD [facility]  09/20/2019, 8:16 PM

## 2019-09-20 NOTE — Procedures (Signed)
I was present at this dialysis session. I have reviewed the session itself and made appropriate changes.   Tol treatment well.    Filed Weights   09/20/19 1214  Weight: 86.6 kg    Recent Labs  Lab 09/20/19 1306  NA 130*  K >7.5*  CL 96*  CO2 18*  GLUCOSE 87  BUN 108*  CREATININE 10.00*  CALCIUM 8.1*    Recent Labs  Lab 09/20/19 1306  WBC 12.6*  NEUTROABS 10.8*  HGB 9.9*  HCT 31.1*  MCV 97.5  PLT 594*    Scheduled Meds: . albuterol  10 mg Nebulization Once   Continuous Infusions: PRN Meds:.   Pearson Grippe  MD 09/20/2019, 5:21 PM

## 2019-09-21 ENCOUNTER — Ambulatory Visit: Payer: Self-pay | Admitting: Physician Assistant

## 2019-09-21 DIAGNOSIS — N186 End stage renal disease: Secondary | ICD-10-CM

## 2019-09-21 DIAGNOSIS — I482 Chronic atrial fibrillation, unspecified: Secondary | ICD-10-CM

## 2019-09-21 DIAGNOSIS — Z992 Dependence on renal dialysis: Secondary | ICD-10-CM

## 2019-09-21 DIAGNOSIS — J418 Mixed simple and mucopurulent chronic bronchitis: Secondary | ICD-10-CM

## 2019-09-21 LAB — BASIC METABOLIC PANEL
Anion gap: 13 (ref 5–15)
Anion gap: 10 (ref 5–15)
BUN: 55 mg/dL — ABNORMAL HIGH (ref 8–23)
BUN: 58 mg/dL — ABNORMAL HIGH (ref 8–23)
CO2: 23 mmol/L (ref 22–32)
CO2: 27 mmol/L (ref 22–32)
Calcium: 7.2 mg/dL — ABNORMAL LOW (ref 8.9–10.3)
Calcium: 7.4 mg/dL — ABNORMAL LOW (ref 8.9–10.3)
Chloride: 94 mmol/L — ABNORMAL LOW (ref 98–111)
Chloride: 96 mmol/L — ABNORMAL LOW (ref 98–111)
Creatinine, Ser: 6.02 mg/dL — ABNORMAL HIGH (ref 0.61–1.24)
Creatinine, Ser: 6.49 mg/dL — ABNORMAL HIGH (ref 0.61–1.24)
GFR calc Af Amer: 11 mL/min — ABNORMAL LOW (ref >60)
GFR calc Af Amer: 10 mL/min — ABNORMAL LOW (ref >60)
GFR calc non Af Amer: 9 mL/min — ABNORMAL LOW (ref >60)
GFR calc non Af Amer: 8 mL/min — ABNORMAL LOW (ref >60)
Glucose, Bld: 108 mg/dL — ABNORMAL HIGH (ref 70–99)
Glucose, Bld: 103 mg/dL — ABNORMAL HIGH (ref 70–99)
Potassium: 4.9 mmol/L (ref 3.5–5.1)
Potassium: 5.7 mmol/L — ABNORMAL HIGH (ref 3.5–5.1)
Sodium: 130 mmol/L — ABNORMAL LOW (ref 135–145)
Sodium: 133 mmol/L — ABNORMAL LOW (ref 135–145)

## 2019-09-21 LAB — CBC
HCT: 25 % — ABNORMAL LOW (ref 39.0–52.0)
Hemoglobin: 8.1 g/dL — ABNORMAL LOW (ref 13.0–17.0)
MCH: 31.2 pg (ref 26.0–34.0)
MCHC: 32.4 g/dL (ref 30.0–36.0)
MCV: 96.2 fL (ref 80.0–100.0)
Platelets: 391 10*3/uL (ref 150–400)
RBC: 2.6 MIL/uL — ABNORMAL LOW (ref 4.22–5.81)
RDW: 15.9 % — ABNORMAL HIGH (ref 11.5–15.5)
WBC: 7.8 10*3/uL (ref 4.0–10.5)
nRBC: 0 % (ref 0.0–0.2)

## 2019-09-21 LAB — PHOSPHORUS: Phosphorus: 6.8 mg/dL — ABNORMAL HIGH (ref 2.5–4.6)

## 2019-09-21 MED ORDER — SODIUM ZIRCONIUM CYCLOSILICATE 10 G PO PACK
10.0000 g | PACK | Freq: Three times a day (TID) | ORAL | Status: AC
  Administered 2019-09-21 (×3): 10 g via ORAL
  Filled 2019-09-21: qty 2
  Filled 2019-09-21: qty 1
  Filled 2019-09-21: qty 2

## 2019-09-21 MED ORDER — FLUTICASONE FUROATE-VILANTEROL 200-25 MCG/INH IN AEPB
1.0000 | INHALATION_SPRAY | Freq: Every day | RESPIRATORY_TRACT | Status: DC
  Administered 2019-09-24 – 2019-09-27 (×4): 1 via RESPIRATORY_TRACT
  Filled 2019-09-21: qty 28

## 2019-09-21 MED ORDER — CHLORHEXIDINE GLUCONATE CLOTH 2 % EX PADS
6.0000 | MEDICATED_PAD | Freq: Every day | CUTANEOUS | Status: DC
  Administered 2019-09-21 – 2019-09-27 (×6): 6 via TOPICAL

## 2019-09-21 MED ORDER — DARBEPOETIN ALFA 100 MCG/0.5ML IJ SOSY
100.0000 ug | PREFILLED_SYRINGE | INTRAMUSCULAR | Status: DC
  Filled 2019-09-21: qty 0.5

## 2019-09-21 MED ORDER — FLUTICASONE FUROATE-VILANTEROL 200-25 MCG/INH IN AEPB: 1.0000 | INHALATION_SPRAY | Freq: Every day | RESPIRATORY_TRACT | Status: DC | PRN

## 2019-09-21 NOTE — ED Notes (Signed)
Called [facility] for transport to [facility]. 

## 2019-09-21 NOTE — ED Notes (Signed)
Talked with Short Stay at [facility] to see if Pt was scheduled for any procedure today.  None noted.  Dr. Wynetta Emery went on and ordered breakfast

## 2019-09-21 NOTE — ED Notes (Signed)
MD aware of BP

## 2019-09-21 NOTE — Progress Notes (Signed)
PROGRESS NOTE   Aaron [name]  JJH:[id number] DOB: 1955-10-08 DOA: 09/20/2019 PCP: [facility]   Chief Complaint  Patient presents with   Shortness of Breath   Fall    Brief Admission History:  64 y.o. male with medical history significant for ESRD, atrial fibrillation, COPD, congestive heart failure.  Patient presented to the ED with complaints of difficulty breathing, cough, and reports that he fell and blacked out.  He reports that he has been having difficulty breathing for the past 2 month, with cough productive of yellowish sputum also over the past 2 months.  He tells me he feels like he is suffocating, and the nasal cannula helps.  Patient missed 4 HD sessions, because of increasing pain, from his surgical incision in his abdomen.  He also reports abdominal distention.  Pt was recently discharged from [facility] for a 20-day hospitalization for A. fib RVR, small bowel obstruction with adhesions status post exploratory laparotomy on 517 and subsequently changed from peritoneal dialysis to hemodialysis.  Assessment & Plan:   Principal Problem:   Hyperkalemia Active Problems:   COPD (chronic obstructive pulmonary disease) (HCC)   ESRD on dialysis (HCC)   Atrial fibrillation, chronic (HCC)   Severe hyperkalemia-potassium was greater than 7.5 due to missed hemodialysis treatments.  He had been receiving hemodialysis since arrival and potassium is slowly improving.  He has received sodium bicarbonate, albuterol, calcium gluconate and followed closely by nephrology.  Follow BMP closely.  A. fib with RVR-he was briefly on a Cardizem infusion and now has been transitioned to oral metoprolol.  His apixaban is currently being held temporarily pending a surgical evaluation of wound hip dehiscence.  Pneumonia-continue supportive care, ceftriaxone and doxycycline.  Infected surgical wound/dehiscence from recent laparotomy for small bowel obstruction 5/17 and  repeated 5/24.  Patient is being admitted to [facility] for surgical evaluation, Dr. Donne Hazel was notified by Dr. Ronalee Belts paying will see the patient on arrival.  Patient has been made n.p.o. after midnight.  Apixaban temporarily on hold.  Decompensated diastolic CHF-patient has missed multiple hemodialysis treatments since discharge and is volume overloaded.  This is being treated with hemodialysis.  COPD-stable continue bronchodilators.  Prolonged QTC-improving likely related to electrolyte imbalances.  Fall at home-complaining of left foot pain CT brain and x-ray foot pending.   DVT prophylaxis: SCDs Code Status: DNR, confirmed with patient Family Communication:  Disposition: Home versus acute rehab  Status is: Inpatient  Remains inpatient appropriate because:Persistent severe electrolyte disturbances   Dispo: The patient is from: Home              Anticipated d/c is to: SNF              Anticipated d/c date is: 3 days              Patient currently is not medically stable to d/c.         Consultants:   Surgery  Nephrology  Procedures:   Hemodialysis  Antimicrobials:  Ceftriaxone/doxycycline  Subjective: Patient denies any specific complaints this morning.  No chest pain or shortness of breath.  Objective: Vitals:   09/21/19 1045 09/21/19 1146 09/21/19 1523 09/21/19 1527  BP: 125/85 101/73 115/87   Pulse:  88 (!) 102 76  Resp:  18 17   Temp:   98 F (36.7 C)   TempSrc:   Oral   SpO2:  95%  96%  Weight:      Height:  Intake/Output Summary (Last 24 hours) at 09/21/2019 1651 Last data filed at 09/20/2019 1857 Gross per 24 hour  Intake 50 ml  Output 800 ml  Net -750 ml   Filed Weights   09/20/19 1214  Weight: 86.6 kg    Examination:  General exam: Appears calm and comfortable  Respiratory system: Clear to auscultation. Respiratory effort normal. Cardiovascular system: S1 & S2 heard, RRR. No JVD, murmurs, rubs, gallops or clicks. No pedal  edema. Gastrointestinal system: Abdomen is obese midline wound dehisced with yellow drainage, soft and nontender. No organomegaly or masses felt. Normal bowel sounds heard. Central nervous system: Alert and oriented. No focal neurological deficits. Extremities: Symmetric 5 x 5 power. Skin: No rashes, lesions or ulcers Psychiatry: Judgement and insight appear normal. Mood & affect appropriate.   Data Reviewed: I have personally reviewed following labs and imaging studies  CBC: Recent Labs  Lab 09/20/19 1306 09/21/19 0421  WBC 12.6* 7.8  NEUTROABS 10.8*  --   HGB 9.9* 8.1*  HCT 31.1* 25.0*  MCV 97.5 96.2  PLT 594* 099    Basic Metabolic Panel: Recent Labs  Lab 09/20/19 1306 09/20/19 2310 09/21/19 0421  NA 130* 130* 133*  K >7.5* 4.9 5.7*  CL 96* 94* 96*  CO2 18* 23 27  GLUCOSE 87 108* 103*  BUN 108* 55* 58*  CREATININE 10.00* 6.02* 6.49*  CALCIUM 8.1* 7.2* 7.4*  MG 2.1  --   --   PHOS  --   --  6.8*    GFR: Estimated Creatinine Clearance: 14.3 mL/min (A) (by C-G formula based on SCr of 6.49 mg/dL (H)).  Liver Function Tests: Recent Labs  Lab 09/20/19 1306  AST 48*  ALT 8  ALKPHOS 148*  BILITOT 0.5  PROT 6.3*  ALBUMIN 2.0*    CBG: Recent Labs  Lab 09/20/19 1522  GLUCAP 72    Recent Results (from the past 240 hour(s))  Blood culture (routine x 2)     Status: None (Preliminary result)   Collection Time: 09/20/19  1:06 PM   Specimen: BLOOD LEFT WRIST  Result Value Ref Range Status   Specimen Description BLOOD LEFT WRIST  Final   Special Requests   Final    BOTTLES DRAWN AEROBIC ONLY Blood Culture adequate volume   Culture   Final    NO GROWTH < 24 HOURS Performed at [facility], [address], [place], [place] [address]    Report Status PENDING  Incomplete  Blood culture (routine x 2)     Status: None (Preliminary result)   Collection Time: 09/20/19  1:17 PM   Specimen: Right Antecubital; Blood  Result Value Ref Range Status   Specimen  Description RIGHT ANTECUBITAL  Final   Special Requests   Final    BOTTLES DRAWN AEROBIC AND ANAEROBIC Blood Culture adequate volume   Culture   Final    NO GROWTH < 24 HOURS Performed at [facility], [address] [place], [place] [address]    Report Status PENDING  Incomplete  SARS Coronavirus 2 by RT PCR (hospital order, performed in [place] hospital lab) Nasopharyngeal Nasopharyngeal Swab     Status: None   Collection Time: 09/20/19  3:37 PM   Specimen: Nasopharyngeal Swab  Result Value Ref Range Status   SARS Coronavirus 2 NEGATIVE NEGATIVE Final    Comment: (NOTE) SARS-CoV-2 target nucleic acids are NOT DETECTED. The SARS-CoV-2 RNA is generally detectable in upper and lower respiratory specimens during the acute phase of infection. The  lowest concentration of SARS-CoV-2 viral copies this assay can detect is 250 copies / mL. A negative result does not preclude SARS-CoV-2 infection and should not be used as the sole basis for treatment or other patient management decisions.  A negative result may occur with improper specimen collection / handling, submission of specimen other than nasopharyngeal swab, presence of viral mutation(s) within the areas targeted by this assay, and inadequate number of viral copies (<250 copies / mL). A negative result must be combined with clinical observations, patient history, and epidemiological information. Fact Sheet for Patients:   StrictlyIdeas.no Fact Sheet for Healthcare Providers: BankingDealers.co.za This test is not yet approved or cleared  by the [place] FDA and has been authorized for detection and/or diagnosis of SARS-CoV-2 by FDA under an Emergency Use Authorization (EUA).  This EUA will remain in effect (meaning this test can be used) for the duration of the COVID-19 declaration under Section 564(b)(1) of the Act, 21 U.S.C. section 360bbb-3(b)(1), unless the authorization is  terminated or revoked sooner. Performed at [facility], [address], [place], [place] [address]      Radiology Studies: DG Abdomen 1 View  Result Date: 09/20/2019 CLINICAL DATA:  Abdominal distension EXAM: ABDOMEN - 1 VIEW COMPARISON:  09/03/2019 FINDINGS: Nonobstructive bowel gas pattern. Moderate volume of stool projects throughout the colon. Catheter tubing overlies the central abdomen. Peritoneal catheter tubing within the central pelvis. Right iliac vascular stent. Severe arthropathy of the right hip joint. The visualized portion of the chest demonstrates a large bore catheter terminating at the level of the superior cavoatrial junction. IMPRESSION: Nonobstructive bowel gas pattern. Moderate volume of stool throughout the colon. Electronically Signed   By: Davina Poke D.O.   On: 09/20/2019 14:35   CT HEAD WO CONTRAST  Result Date: 09/20/2019 CLINICAL DATA:  Fall last night striking head on sofa. On anticoagulation. EXAM: CT HEAD WITHOUT CONTRAST TECHNIQUE: Contiguous axial images were obtained from the base of the skull through the vertex without intravenous contrast. COMPARISON:  Head CT 03/14/2019 FINDINGS: Brain: No acute hemorrhage. Stable degree of atrophy and chronic small vessel ischemia. Extra-axial CSF density in the right middle cranial fossa is unchanged from prior exam, most consistent with arachnoid cyst. No midline shift or mass effect. No evidence of acute ischemia. Vascular: Atherosclerosis of skullbase vasculature without hyperdense vessel or abnormal calcification. Skull: No fracture or focal lesion. Sinuses/Orbits: Chronic opacification of right maxillary sinus. Right mastoid air cells are hypo pneumatized. No skull fracture or acute osseous abnormality. Other: None. IMPRESSION: 1. No acute intracranial abnormality. No skull fracture. 2. Stable atrophy and chronic small vessel ischemia. 3. Unchanged right middle cranial fossa arachnoid cyst. Electronically Signed   By:  Keith Rake M.D.   On: 09/20/2019 21:58   DG Chest Portable 1 View  Result Date: 09/20/2019 CLINICAL DATA:  Weakness.  Increased shortness of breath. EXAM: PORTABLE CHEST 1 VIEW COMPARISON:  09/04/2019 FINDINGS: Increased densities at the left lung base compared to the previous examination. Left jugular dialysis catheter is stable with the tip at the SVC and right atrium junction. Lucency in both lungs compatible with chronic lung changes. Heart size is upper limits of normal and stable. Right costophrenic angle is not completely imaged on this examination. Surgical plate in the cervical spine. IMPRESSION: Left basilar chest densities. Findings are nonspecific but could represent atelectasis, pleural fluid or even infection. Stable appearance of the dialysis catheter. Electronically Signed   By: Markus Daft M.D.   On: 09/20/2019  14:03   DG Foot 2 Views Left  Result Date: 09/20/2019 CLINICAL DATA:  History of recent fall. EXAM: LEFT FOOT - 2 VIEW COMPARISON:  Aug 22, 2019 FINDINGS: There is no evidence of an acute fracture or dislocation. Chronic deformities are seen involving the distal aspects of the first and second left metatarsals, as well as the middle phalanges of the second and third left toes. Moderate severity degenerative changes are seen involving the metatarsophalangeal articulation of the left great toe. Soft tissues are unremarkable. IMPRESSION: Chronic and degenerative changes, without evidence of acute osseous abnormality. Electronically Signed   By: Virgina Norfolk M.D.   On: 09/20/2019 22:02     Scheduled Meds:  atorvastatin  80 mg Oral Daily   calcium acetate  667 mg Oral TID WC   darbepoetin (ARANESP) injection - DIALYSIS  100 mcg Intravenous Q Wed-HD   sodium zirconium cyclosilicate  10 g Oral TID   Continuous Infusions:  cefTRIAXone (ROCEPHIN)  IV Stopped (09/20/19 2102)   diltiazem (CARDIZEM) infusion Stopped (09/21/19 0815)   doxycycline (VIBRAMYCIN) IV 100  mL/hr at 09/21/19 1106     LOS: 1 day   Time spent: 38 minutes   Ileene Allie Wynetta Emery, MD How to contact the [facility] Attending or Consulting provider [place] or covering provider during after hours [place], for this patient?  1. Check the care team in [facility] and look for a) attending/consulting [facility] provider listed and b) the [facility] team listed 2. Log into [email or web address] and use [place]'s universal password to access. If you do not have the password, please contact the hospital operator. 3. Locate the [facility] provider you are looking for under [facility] and page to a number that you can be directly reached. 4. If you still have difficulty reaching the provider, please page the [facility] (Director on Call) for the Hospitalists listed on amion for assistance.  09/21/2019, 4:51 PM

## 2019-09-21 NOTE — Progress Notes (Signed)
Pt arrived from [facility] ED to 4E room 24.  Telemetry monitor applied and CCMD notified.  CHG bath and skin assessment completed.  Pt oriented to unit and room to include call light and phone.  Abdominal wound redressed per MD order without difficulty.  Will continue to monitor.

## 2019-09-21 NOTE — ED Notes (Signed)
Pt states does not require home o2 but reports "I am always on it in the hospital."

## 2019-09-21 NOTE — ED Notes (Signed)
Applied Mepilex dressing to pt sacrum. Blanchable redness noted to sacrum. Skin intact.

## 2019-09-21 NOTE — Progress Notes (Signed)
Patient ID: Aaron [name], male   DOB: 1956/01/04, 64 y.o.   MRN: [id number] Patient seen upon arrival to 4 E 24 from [facility] emergency room.  He informs me that he got some Lokelma prior to discharge but unfortunately could not get hemodialysis today.  He denies any chest pain and is comfortable on the current oxygen supplementation via nasal cannula.  On exam, diminished breath sounds over both bases with fine rales left side.  Abdominal distention with intact serous stained dressing over recent midline surgical site.  I will order for hemodialysis tomorrow first round.  Elmarie Shiley MD [facility]. Office # [phone] Pager # [phone] 5:29 PM

## 2019-09-21 NOTE — ED Notes (Signed)
Consulted hospitalist regarding meal tray. Order still for npo. Hospitalist reported to consult surgery at [facility] and to keep updated. If delay in getting bed, hospitalist reported to place order for heart healthy diet.

## 2019-09-21 NOTE — Progress Notes (Signed)
Admit: 09/20/2019 LOS: 1  54M ESRD recent PD-->HD, SBO req ex-lap, re-presented with Wide COmplex Tachy, K > 7.5, and infected abd incision with dehiscence  Subjective:  . HD overnight. limted UF of 0.8L 2/2 RVR but got full tx, K 5.7 this AM . To [facility] for surgical eval today . QRS narrowed, HR < 100 .   06/08 0701 - 06/09 0700 In: 100 [IV Piggyback:100] Out: 800   Filed Weights   09/20/19 1214  Weight: 86.6 kg    Scheduled Meds: . atorvastatin  80 mg Oral Daily  . calcium acetate  667 mg Oral TID WC  . sodium zirconium cyclosilicate  10 g Oral TID   Continuous Infusions: . cefTRIAXone (ROCEPHIN)  IV Stopped (09/20/19 2102)  . diltiazem (CARDIZEM) infusion Stopped (09/21/19 0815)  . doxycycline (VIBRAMYCIN) IV 100 mg (09/21/19 0928)   PRN Meds:.acetaminophen **OR** acetaminophen, fluticasone furoate-vilanterol, HYDROmorphone (DILAUDID) injection, ipratropium-albuterol, polyethylene glycol  Current Labs: reviewed    Physical Exam:  Blood pressure 105/65, pulse (!) 49, temperature 98.4 F (36.9 C), temperature source Oral, resp. rate 15, height 6\' 4"  (1.93 m), weight 86.6 kg, SpO2 100 %. NAD, in bed Abd incisions bandaged, H&P imaging reviewed Regular, nl s1s2 L IJ TDC C/D, bandaged Trace LEE CTAB Nonfocal  A 1. ESRD, recent PD-->HD, [facility].  PD cath still in.  MWF but didn't attend outpt HD since 5/30 dc from [facility] 2. Severe hyperkalemia with wide complex tachycardia, improved 3. Infected abd incision, to [facility] for CCS eval; doxy/ceftriaxone per TRH 4. Recent SBO and ex-lap 5. AFib with RVR 6. COPD 7. CKD-BMD, last P was on low side, need  Rpt, Ca corrects 8. Anemia, Hb 9.9 to 8.1, no overt losses 9. HTN/Vol: limited Uf with RVR  P . HD again today here if not transferred or at [facility] when arrives, [facility]: 2K, 3.5h, No heparin.   Julienne Kass ESA today . Check P with HD . Afte rHD today can hold lokelma . Medication Issues; o Preferred narcotic agents for pain control  are hydromorphone, fentanyl, and methadone. Morphine should not be used.  o Baclofen should be avoided o Avoid oral sodium phosphate and magnesium citrate based laxatives / bowel preps    Pearson Grippe MD 09/21/2019, 9:59 AM  Recent Labs  Lab 09/20/19 1306 09/20/19 2310 09/21/19 0421  NA 130* 130* 133*  K >7.5* 4.9 5.7*  CL 96* 94* 96*  CO2 18* 23 27  GLUCOSE 87 108* 103*  BUN 108* 55* 58*  CREATININE 10.00* 6.02* 6.49*  CALCIUM 8.1* 7.2* 7.4*   Recent Labs  Lab 09/20/19 1306 09/21/19 0421  WBC 12.6* 7.8  NEUTROABS 10.8*  --   HGB 9.9* 8.1*  HCT 31.1* 25.0*  MCV 97.5 96.2  PLT 594* 391

## 2019-09-22 DIAGNOSIS — J9621 Acute and chronic respiratory failure with hypoxia: Secondary | ICD-10-CM

## 2019-09-22 DIAGNOSIS — T8130XS Disruption of wound, unspecified, sequela: Secondary | ICD-10-CM

## 2019-09-22 DIAGNOSIS — I5033 Acute on chronic diastolic (congestive) heart failure: Secondary | ICD-10-CM

## 2019-09-22 DIAGNOSIS — E8770 Fluid overload, unspecified: Secondary | ICD-10-CM

## 2019-09-22 DIAGNOSIS — R5381 Other malaise: Secondary | ICD-10-CM

## 2019-09-22 DIAGNOSIS — I4891 Unspecified atrial fibrillation: Secondary | ICD-10-CM

## 2019-09-22 LAB — RENAL FUNCTION PANEL
Albumin: 1.4 g/dL — ABNORMAL LOW (ref 3.5–5.0)
Anion gap: 11 (ref 5–15)
BUN: 64 mg/dL — ABNORMAL HIGH (ref 8–23)
CO2: 23 mmol/L (ref 22–32)
Calcium: 6.9 mg/dL — ABNORMAL LOW (ref 8.9–10.3)
Chloride: 97 mmol/L — ABNORMAL LOW (ref 98–111)
Creatinine, Ser: 7.12 mg/dL — ABNORMAL HIGH (ref 0.61–1.24)
GFR calc Af Amer: 9 mL/min — ABNORMAL LOW (ref >60)
GFR calc non Af Amer: 7 mL/min — ABNORMAL LOW (ref >60)
Glucose, Bld: 118 mg/dL — ABNORMAL HIGH (ref 70–99)
Phosphorus: 6.5 mg/dL — ABNORMAL HIGH (ref 2.5–4.6)
Potassium: 5.6 mmol/L — ABNORMAL HIGH (ref 3.5–5.1)
Sodium: 131 mmol/L — ABNORMAL LOW (ref 135–145)

## 2019-09-22 LAB — APTT
aPTT: 38 seconds — ABNORMAL HIGH (ref 24–36)
aPTT: 52 seconds — ABNORMAL HIGH (ref 24–36)

## 2019-09-22 LAB — CBC
HCT: 26.5 % — ABNORMAL LOW (ref 39.0–52.0)
Hemoglobin: 8.5 g/dL — ABNORMAL LOW (ref 13.0–17.0)
MCH: 30.9 pg (ref 26.0–34.0)
MCHC: 32.1 g/dL (ref 30.0–36.0)
MCV: 96.4 fL (ref 80.0–100.0)
Platelets: 410 10*3/uL — ABNORMAL HIGH (ref 150–400)
RBC: 2.75 MIL/uL — ABNORMAL LOW (ref 4.22–5.81)
RDW: 16 % — ABNORMAL HIGH (ref 11.5–15.5)
WBC: 8.7 10*3/uL (ref 4.0–10.5)
nRBC: 0 % (ref 0.0–0.2)

## 2019-09-22 LAB — HEPARIN LEVEL (UNFRACTIONATED): Heparin Unfractionated: 1.44 [IU]/mL — ABNORMAL HIGH (ref 0.30–0.70)

## 2019-09-22 MED ORDER — DILTIAZEM HCL 60 MG PO TABS: 30.0000 mg | ORAL_TABLET | Freq: Four times a day (QID) | ORAL | Status: DC | PRN

## 2019-09-22 MED ORDER — CALCIUM ACETATE (PHOS BINDER) 667 MG PO CAPS
2001.0000 mg | ORAL_CAPSULE | Freq: Three times a day (TID) | ORAL | Status: DC
  Administered 2019-09-22 – 2019-09-24 (×4): 2001 mg via ORAL
  Filled 2019-09-22 (×6): qty 3

## 2019-09-22 MED ORDER — SODIUM CHLORIDE 0.9 % IV SOLN: 100.0000 mL | INTRAVENOUS | Status: DC | PRN

## 2019-09-22 MED ORDER — DILTIAZEM HCL 60 MG PO TABS: 30.0000 mg | ORAL_TABLET | Freq: Four times a day (QID) | ORAL | Status: DC

## 2019-09-22 MED ORDER — HEPARIN (PORCINE) 25000 UT/250ML-% IV SOLN
1600.0000 [IU]/h | INTRAVENOUS | Status: DC
  Administered 2019-09-22: 1400 [IU]/h via INTRAVENOUS
  Administered 2019-09-23: 1600 [IU]/h via INTRAVENOUS
  Filled 2019-09-22 (×2): qty 250

## 2019-09-22 MED ORDER — HEPARIN SODIUM (PORCINE) 1000 UNIT/ML IJ SOLN
INTRAMUSCULAR | Status: AC
  Administered 2019-09-22: 4000 [IU]
  Filled 2019-09-22: qty 4

## 2019-09-22 MED ORDER — GABAPENTIN 100 MG PO CAPS
200.0000 mg | ORAL_CAPSULE | Freq: Every day | ORAL | Status: DC
  Administered 2019-09-22 – 2019-09-26 (×5): 200 mg via ORAL
  Filled 2019-09-22 (×5): qty 2

## 2019-09-22 MED ORDER — DOXYCYCLINE HYCLATE 100 MG PO TABS
100.0000 mg | ORAL_TABLET | Freq: Two times a day (BID) | ORAL | Status: DC
  Administered 2019-09-22 – 2019-09-23 (×2): 100 mg via ORAL
  Filled 2019-09-22 (×2): qty 1

## 2019-09-22 MED ORDER — METOPROLOL TARTRATE 25 MG PO TABS
25.0000 mg | ORAL_TABLET | Freq: Two times a day (BID) | ORAL | Status: DC
  Administered 2019-09-22 – 2019-09-27 (×11): 25 mg via ORAL
  Filled 2019-09-22 (×11): qty 1

## 2019-09-22 MED ORDER — ALTEPLASE 2 MG IJ SOLR: 2.0000 mg | Freq: Once | INTRAMUSCULAR | Status: DC | PRN

## 2019-09-22 MED ORDER — HEPARIN SODIUM (PORCINE) 1000 UNIT/ML DIALYSIS: 1000.0000 [IU] | INTRAMUSCULAR | Status: DC | PRN

## 2019-09-22 MED ORDER — DILTIAZEM HCL 60 MG PO TABS
30.0000 mg | ORAL_TABLET | Freq: Four times a day (QID) | ORAL | Status: DC
  Administered 2019-09-22 – 2019-09-23 (×4): 30 mg via ORAL
  Filled 2019-09-22 (×4): qty 1

## 2019-09-22 MED ORDER — DILTIAZEM HCL 25 MG/5ML IV SOLN
15.0000 mg | INTRAVENOUS | Status: AC
  Administered 2019-09-22: 15 mg via INTRAVENOUS
  Filled 2019-09-22: qty 5

## 2019-09-22 NOTE — Progress Notes (Addendum)
PROGRESS NOTE  Aaron [name] DQQ:[id number] DOB: Jul 29, 1955   PCP: Gwenlyn Saran Hutto  Patient is from: Home.  DOA: 09/20/2019 LOS: 2  Brief Narrative / Interim history: 64 y.o. male with history of ESRD, A. fib, COPD, diastolic CHF, recent hospitalization for SBO s/p ex lap with retention sutures for laparotomy wound presented to [facility] with acute on chronic difficulty breathing and productive cough. Patient missed 4 HD sessions, because of increasing pain, from his surgical incision in his abdomen.  He also reports abdominal distention.   He also had a fall at home where he landed on his backside.  Denies hitting his head or loss of consciousness.  On admission patient was found to have hyperkalemia to 7.5.  He was also in A. fib with RVR and started on Cardizem drip.  There is also concern about pneumonia for which he was started on antibiotics.  General surgery consulted for laparotomy wound dehiscence and possible infection.  He was transferred to [facility] for hemodialysis.   Subjective: Seen and examined earlier this morning.  No major events overnight.  Reports abdominal distention.  Denies chest pain or shortness of breath.  He went into A. fib with RVR with HR in 130-150 after dialysis today.  Realized that patient was not transitioned to p.o. Cardizem after he came off Cardizem drip.   Objective: Vitals:   09/22/19 1115 09/22/19 1125 09/22/19 1241 09/22/19 1319  BP: (!) 127/92 (!) 141/99 (!) 130/116 (!) 140/93  Pulse: (!) 110 (!) 122 (!) 125   Resp: 16 14 16    Temp:  98.6 F (37 C) 98.4 F (36.9 C)   TempSrc:  Oral Oral   SpO2: 99% 95% 100%   Weight:  90.7 kg    Height:        Intake/Output Summary (Last 24 hours) at 09/22/2019 1323 Last data filed at 09/22/2019 1125 Gross per 24 hour  Intake 612.85 ml  Output 2300 ml  Net -1687.15 ml   Filed Weights   09/20/19 1214 09/22/19 0720 09/22/19 1125  Weight: 86.6 kg 92.8 kg 90.7 kg     Examination:  GENERAL: No apparent distress.  Nontoxic. HEENT: MMM.  Vision and hearing grossly intact.  NECK: Supple.  No apparent JVD.  RESP:  No IWOB.  Fair aeration bilaterally. CVS:  RRR. Heart sounds normal.  ABD/GI/GU: BS+. Abd soft.  Laparotomy wound dehiscence as below MSK/EXT:  Moves extremities. No apparent deformity. No edema.  SKIN: Laparotomy wound dehiscence as in the picture below. NEURO: Awake, alert and oriented appropriately.  No apparent focal neuro deficit. PSYCH: Calm. Normal affect.      Procedures:  None  Microbiology summarized: COVID-19 PCR negative. Blood cultures NGTD.  Assessment & Plan: Severe hyperkalemia: K7.5 on admit.  Improved. ESRD/fluid overload  -Reportedly missed for HD sessions due to pain -Nephrology managing.   A. fib with RVR-started on Cardizem drip on admission but was not transitioned to p.o.  Went into RVR after dialysis this morning.  Patient is on metoprolol, Cardizem and Eliquis at home. -IV Cardizem 50 mg once -Resume home metoprolol at 25 mg twice daily -P.o. Cardizem 25 mg every 6 hours -Start anticoagulation with IV heparin   Pneumonia?-Unlikely.  Patient has no fever or leukocytosis.  X-ray findings are nonspecific and could be due to fluid overload in the setting of missed dialysis or atelectasis from shallow breathing due to surgical wound pain.  He has chronic productive cough from his COPD -Discontinue ceftriaxone -  Continue doxycycline -Incentive spirometry  Acute on chronic respiratory failure with hypoxia-reported desaturation to upper 70s and lower 80s requiring 4 L to recover.  Likely due to fluid overload -Wean oxygen as able. -Fluid management with hemodialysis. -COPD treatments as below.   SBO status post ex lap x2 with abdominal wall closure with retention sutures for fascial dehiscence previous hospitalization. -General surgery following, no plan for surgery. -Wet-to-dry dressing and abdominal  binder   Decompensated diastolic CHF-patient has missed multiple hemodialysis treatments since discharge and is volume overloaded.   -Fluid management with dialysis.   Chronic COPD-patient with chronic productive cough. -Continue home breathing treatments -Continue doxycycline  Hypervolemic hyponatremia in the setting of ESRD and fluid overload -Manage with hemodialysis.   Prolonged QTC-resolved on repeat EKG. -Optimize electrolytes -Minimize QT prolonging drugs   Fall at home-seems mechanical in the setting of pain and debility.  CT head and left foot x-ray without significant finding. -Symptomatic care -PT/OT eval           DVT prophylaxis: On heparin drip for A. fib Code Status: DNR/DNI Family Communication: Patient and/or RN. Available if any question.  Status is: Inpatient  Remains inpatient appropriate because:Hemodynamically unstable, IV treatments appropriate due to intensity of illness or inability to take PO and Inpatient level of care appropriate due to severity of illness   Dispo: The patient is from: Home              Anticipated d/c is to: SNF              Anticipated d/c date is: > 3 days              Patient currently is not medically stable to d/c.       Consultants:  Nephrology General surgery   Sch Meds:  Scheduled Meds:  atorvastatin  80 mg Oral Daily   calcium acetate  2,001 mg Oral TID WC   Chlorhexidine Gluconate Cloth  6 each Topical Daily   darbepoetin (ARANESP) injection - DIALYSIS  100 mcg Intravenous Q Wed-HD   diltiazem  15 mg Intravenous STAT   fluticasone furoate-vilanterol  1 puff Inhalation Daily   Continuous Infusions:  cefTRIAXone (ROCEPHIN)  IV 1 g (09/21/19 2104)   doxycycline (VIBRAMYCIN) IV 100 mg (09/21/19 2135)   PRN Meds:.acetaminophen **OR** acetaminophen, HYDROmorphone (DILAUDID) injection, ipratropium-albuterol, polyethylene glycol  Antimicrobials: Anti-infectives (From admission, onward)    Start      Dose/Rate Route Frequency Ordered Stop   09/20/19 2000  doxycycline (VIBRAMYCIN) 100 mg in sodium chloride 0.9 % 250 mL IVPB     Discontinue     100 mg 125 mL/hr over 120 Minutes Intravenous Every 12 hours 09/20/19 1944     09/20/19 1950  cefTRIAXone (ROCEPHIN) 1 g in sodium chloride 0.9 % 100 mL IVPB     Discontinue     1 g 200 mL/hr over 30 Minutes Intravenous Every 24 hours 09/20/19 1943          I have personally reviewed the following labs and images: CBC: Recent Labs  Lab 09/20/19 1306 09/21/19 0421 09/22/19 0323  WBC 12.6* 7.8 8.7  NEUTROABS 10.8*  --   --   HGB 9.9* 8.1* 8.5*  HCT 31.1* 25.0* 26.5*  MCV 97.5 96.2 96.4  PLT 594* 391 410*   BMP &GFR Recent Labs  Lab 09/20/19 1306 09/20/19 2310 09/21/19 0421 09/22/19 0323  NA 130* 130* 133* 131*  K >7.5* 4.9 5.7* 5.6*  CL 96* 94*  96* 97*  CO2 18* 23 27 23   GLUCOSE 87 108* 103* 118*  BUN 108* 55* 58* 64*  CREATININE 10.00* 6.02* 6.49* 7.12*  CALCIUM 8.1* 7.2* 7.4* 6.9*  MG 2.1  --   --   --   PHOS  --   --  6.8* 6.5*   Estimated Creatinine Clearance: 13 mL/min (A) (by C-G formula based on SCr of 7.12 mg/dL (H)). Liver & Pancreas: Recent Labs  Lab 09/20/19 1306 09/22/19 0323  AST 48*  --   ALT 8  --   ALKPHOS 148*  --   BILITOT 0.5  --   PROT 6.3*  --   ALBUMIN 2.0* 1.4*   No results for input(s): LIPASE, AMYLASE in the last 168 hours. No results for input(s): AMMONIA in the last 168 hours. Diabetic: No results for input(s): HGBA1C in the last 72 hours. Recent Labs  Lab 09/20/19 1522  GLUCAP 72   Cardiac Enzymes: No results for input(s): CKTOTAL, CKMB, CKMBINDEX, TROPONINI in the last 168 hours. No results for input(s): PROBNP in the last 8760 hours. Coagulation Profile: No results for input(s): INR, PROTIME in the last 168 hours. Thyroid Function Tests: No results for input(s): TSH, T4TOTAL, FREET4, T3FREE, THYROIDAB in the last 72 hours. Lipid Profile: No results for input(s): CHOL, HDL,  LDLCALC, TRIG, CHOLHDL, LDLDIRECT in the last 72 hours. Anemia Panel: No results for input(s): VITAMINB12, FOLATE, FERRITIN, TIBC, IRON, RETICCTPCT in the last 72 hours. Urine analysis:    Component Value Date/Time   COLORURINE YELLOW 05/27/2019 0524   APPEARANCEUR CLEAR 05/27/2019 0524   LABSPEC 1.025 05/27/2019 0524   PHURINE 6.0 05/27/2019 0524   GLUCOSEU 150 (A) 05/27/2019 0524   HGBUR MODERATE (A) 05/27/2019 0524   BILIRUBINUR NEGATIVE 05/27/2019 0524   KETONESUR NEGATIVE 05/27/2019 0524   PROTEINUR 100 (A) 05/27/2019 0524   UROBILINOGEN 0.2 03/03/2010 1056   NITRITE NEGATIVE 05/27/2019 0524   LEUKOCYTESUR TRACE (A) 05/27/2019 0524   Sepsis Labs: Invalid input(s): PROCALCITONIN, [place]  Microbiology: Recent Results (from the past 240 hour(s))  Blood culture (routine x 2)     Status: None (Preliminary result)   Collection Time: 09/20/19  1:06 PM   Specimen: BLOOD LEFT WRIST  Result Value Ref Range Status   Specimen Description BLOOD LEFT WRIST  Final   Special Requests   Final    BOTTLES DRAWN AEROBIC ONLY Blood Culture adequate volume   Culture   Final    NO GROWTH 2 DAYS Performed at [facility], [address] [place], [place] [address]    Report Status PENDING  Incomplete  Blood culture (routine x 2)     Status: None (Preliminary result)   Collection Time: 09/20/19  1:17 PM   Specimen: Right Antecubital; Blood  Result Value Ref Range Status   Specimen Description RIGHT ANTECUBITAL  Final   Special Requests   Final    BOTTLES DRAWN AEROBIC AND ANAEROBIC Blood Culture adequate volume   Culture   Final    NO GROWTH 2 DAYS Performed at [facility], [address], [place], [place] [address]    Report Status PENDING  Incomplete  SARS Coronavirus 2 by RT PCR (hospital order, performed in [facility] hospital lab) Nasopharyngeal Nasopharyngeal Swab     Status: None   Collection Time: 09/20/19  3:37 PM   Specimen: Nasopharyngeal Swab  Result Value Ref Range  Status   SARS Coronavirus 2 NEGATIVE NEGATIVE Final    Comment: (NOTE) SARS-CoV-2 target nucleic acids  are NOT DETECTED. The SARS-CoV-2 RNA is generally detectable in upper and lower respiratory specimens during the acute phase of infection. The lowest concentration of SARS-CoV-2 viral copies this assay can detect is 250 copies / mL. A negative result does not preclude SARS-CoV-2 infection and should not be used as the sole basis for treatment or other patient management decisions.  A negative result may occur with improper specimen collection / handling, submission of specimen other than nasopharyngeal swab, presence of viral mutation(s) within the areas targeted by this assay, and inadequate number of viral copies (<250 copies / mL). A negative result must be combined with clinical observations, patient history, and epidemiological information. Fact Sheet for Patients:   StrictlyIdeas.no Fact Sheet for Healthcare Providers: BankingDealers.co.za This test is not yet approved or cleared  by the [place] FDA and has been authorized for detection and/or diagnosis of SARS-CoV-2 by FDA under an Emergency Use Authorization (EUA).  This EUA will remain in effect (meaning this test can be used) for the duration of the COVID-19 declaration under Section 564(b)(1) of the Act, 21 U.S.C. section 360bbb-3(b)(1), unless the authorization is terminated or revoked sooner. Performed at [facility], [address], [place], [place] [address]     Radiology Studies: No results found.  45 minutes with more than 50% spent in reviewing records, counseling patient/family and coordinating care.   Cacey Willow T. Herrings  If 7PM-7AM, please contact night-coverage [email or web address] Password [email or web address] 09/22/2019, 1:23 PM

## 2019-09-22 NOTE — Progress Notes (Signed)
[facility] Progress Note     Subjective: CC-  Aaron [name] is a 63yo male PMH ESRD, COPD and CHF who was transferred from [facility] to [facility] yesterday. He reports syncopal episode and sustaining a fall. He was found to be hyperkalemic, in a fib with RVR, and with possible pneumonia. Patient was recently admitted 5/10 through 5/30 with an SBO where he underwent Exploratory laparotomy, LOA, repair of SB serosal injury x3, resection of mesenteric nodule 08/29/19 by Dr. Bobbye Morton. He had a Partial fascial dehiscence of his wound and was taken back to the OR 09/05/19 by Dr. Georgette Dover for Exploratory laparotomy, closure abdominal wall, placement of retention sutures. He was discharged home 5/30 in good condition. States that over the last couple of days he has had increasing pain and drainage from his abdominal wound. Denies nausea, vomiting, fever, or chills. He is having regular bowel function.   Objective: Vital signs in last 24 hours: Temp:  [98 F (36.7 C)-98.9 F (37.2 C)] 98.9 F (37.2 C) (06/10 0720) Pulse Rate:  [47-124] 117 (06/10 0730) Resp:  [12-20] 16 (06/10 0725) BP: (101-140)/(65-97) 127/86 (06/10 0730) SpO2:  [95 %-100 %] 100 % (06/10 0720) Weight:  [92.8 kg] 92.8 kg (06/10 0720) Last BM Date: 09/22/19  Intake/Output from previous day: 06/09 0701 - 06/10 0700 In: 612.9 [I.V.:162.8; IV Piggyback:450.1] Out: 300 [Urine:300] Intake/Output this shift: No intake/output data recorded.  PE: Gen:  Alert, NAD, pleasant Card:  tachycardic Pulm:  rate and effort normal Abd: Soft, distended, +BS, tender over incision otherwise abdomen nontender, no peritonitis, open midline wound with fibrinous tissue, 3 retention sutures in place but the middle one is pulling through, there is a small opening at the proximal/left lateral side with some dehiscence and serous fluid draining     Lab Results:  Recent Labs    09/21/19 0421 09/22/19 0323  WBC 7.8 8.7  HGB 8.1* 8.5*  HCT  25.0* 26.5*  PLT 391 410*   BMET Recent Labs    09/21/19 0421 09/22/19 0323  NA 133* 131*  K 5.7* 5.6*  CL 96* 97*  CO2 27 23  GLUCOSE 103* 118*  BUN 58* 64*  CREATININE 6.49* 7.12*  CALCIUM 7.4* 6.9*   PT/INR No results for input(s): LABPROT, INR in the last 72 hours. CMP     Component Value Date/Time   NA 131 (L) 09/22/2019 0323   K 5.6 (H) 09/22/2019 0323   CL 97 (L) 09/22/2019 0323   CO2 23 09/22/2019 0323   GLUCOSE 118 (H) 09/22/2019 0323   BUN 64 (H) 09/22/2019 0323   CREATININE 7.12 (H) 09/22/2019 0323   CALCIUM 6.9 (L) 09/22/2019 0323   PROT 6.3 (L) 09/20/2019 1306   ALBUMIN 1.4 (L) 09/22/2019 0323   AST 48 (H) 09/20/2019 1306   ALT 8 09/20/2019 1306   ALKPHOS 148 (H) 09/20/2019 1306   BILITOT 0.5 09/20/2019 1306   GFRNONAA 7 (L) 09/22/2019 0323   GFRAA 9 (L) 09/22/2019 0323   Lipase     Component Value Date/Time   LIPASE 23 08/25/2019 1740       Studies/Results: DG Abdomen 1 View  Result Date: 09/20/2019 CLINICAL DATA:  Abdominal distension EXAM: ABDOMEN - 1 VIEW COMPARISON:  09/03/2019 FINDINGS: Nonobstructive bowel gas pattern. Moderate volume of stool projects throughout the colon. Catheter tubing overlies the central abdomen. Peritoneal catheter tubing within the central pelvis. Right iliac vascular stent. Severe arthropathy of the right hip joint. The visualized portion of the  chest demonstrates a large bore catheter terminating at the level of the superior cavoatrial junction. IMPRESSION: Nonobstructive bowel gas pattern. Moderate volume of stool throughout the colon. Electronically Signed   By: Davina Poke D.O.   On: 09/20/2019 14:35   CT HEAD WO CONTRAST  Result Date: 09/20/2019 CLINICAL DATA:  Fall last night striking head on sofa. On anticoagulation. EXAM: CT HEAD WITHOUT CONTRAST TECHNIQUE: Contiguous axial images were obtained from the base of the skull through the vertex without intravenous contrast. COMPARISON:  Head CT 03/14/2019  FINDINGS: Brain: No acute hemorrhage. Stable degree of atrophy and chronic small vessel ischemia. Extra-axial CSF density in the right middle cranial fossa is unchanged from prior exam, most consistent with arachnoid cyst. No midline shift or mass effect. No evidence of acute ischemia. Vascular: Atherosclerosis of skullbase vasculature without hyperdense vessel or abnormal calcification. Skull: No fracture or focal lesion. Sinuses/Orbits: Chronic opacification of right maxillary sinus. Right mastoid air cells are hypo pneumatized. No skull fracture or acute osseous abnormality. Other: None. IMPRESSION: 1. No acute intracranial abnormality. No skull fracture. 2. Stable atrophy and chronic small vessel ischemia. 3. Unchanged right middle cranial fossa arachnoid cyst. Electronically Signed   By: Keith Rake M.D.   On: 09/20/2019 21:58   DG Chest Portable 1 View  Result Date: 09/20/2019 CLINICAL DATA:  Weakness.  Increased shortness of breath. EXAM: PORTABLE CHEST 1 VIEW COMPARISON:  09/04/2019 FINDINGS: Increased densities at the left lung base compared to the previous examination. Left jugular dialysis catheter is stable with the tip at the SVC and right atrium junction. Lucency in both lungs compatible with chronic lung changes. Heart size is upper limits of normal and stable. Right costophrenic angle is not completely imaged on this examination. Surgical plate in the cervical spine. IMPRESSION: Left basilar chest densities. Findings are nonspecific but could represent atelectasis, pleural fluid or even infection. Stable appearance of the dialysis catheter. Electronically Signed   By: Markus Daft M.D.   On: 09/20/2019 14:03   DG Foot 2 Views Left  Result Date: 09/20/2019 CLINICAL DATA:  History of recent fall. EXAM: LEFT FOOT - 2 VIEW COMPARISON:  Aug 22, 2019 FINDINGS: There is no evidence of an acute fracture or dislocation. Chronic deformities are seen involving the distal aspects of the first and second  left metatarsals, as well as the middle phalanges of the second and third left toes. Moderate severity degenerative changes are seen involving the metatarsophalangeal articulation of the left great toe. Soft tissues are unremarkable. IMPRESSION: Chronic and degenerative changes, without evidence of acute osseous abnormality. Electronically Signed   By: Virgina Norfolk M.D.   On: 09/20/2019 22:02    Anti-infectives: Anti-infectives (From admission, onward)   Start     Dose/Rate Route Frequency Ordered Stop   09/20/19 2000  doxycycline (VIBRAMYCIN) 100 mg in sodium chloride 0.9 % 250 mL IVPB     Discontinue     100 mg 125 mL/hr over 120 Minutes Intravenous Every 12 hours 09/20/19 1944     09/20/19 1950  cefTRIAXone (ROCEPHIN) 1 g in sodium chloride 0.9 % 100 mL IVPB     Discontinue     1 g 200 mL/hr over 30 Minutes Intravenous Every 24 hours 09/20/19 1943         Assessment/Plan CHF COPD A fib - eliquis on hold Polycystic kidney disease, ESRD, on PD - converted to HD  H/O MI - plavix on hold CAD Hep C HLD Pneumonia Hyperkalemia  SBO -Exploratory laparotomy,  LOA, repair of SB serosal injury x3, resection of mesenteric nodule 08/29/19 Dr. Kirk Ruths 24 -Exploratory laparotomy, washout, abdominal wall closure with placement of retention sutures for fascial dehiscence5/24/21 Dr. Loreli Dollar - Wound is pictured above. Reviewed with MD, nothing to do surgically at this point. BID wet to dry dressing changes. Abdominal binder ordered. Will monitor.   FEN - soft diet VTE - SCDs ID -rocephin and doxycycline for PNA Foley - none   LOS: 2 days    Wellington Hampshire, [facility] 09/22/2019, 7:55 AM Please see Amion for pager number during day hours 7:00am-4:30pm

## 2019-09-22 NOTE — Progress Notes (Addendum)
Patient ID: Aaron [name], male   DOB: 25-Mar-1956, 64 y.o.   MRN: [id number] [facility] Progress Note   Assessment/ Plan:   1.  Dehiscent anterior abdominal wound: Status post recent exploratory laparotomy for SBO.  On antimicrobial therapy with intravenous ceftriaxone/oral doxycycline and seen earlier by surgery with recommendations for continued local wound care at this time. 2. ESRD: With poor adherence to outpatient hemodialysis prior to admission and critical hyperkalemia noted on labs for which he underwent emergent hemodialysis at [facility].  Ongoing hemodialysis at this time; usually on a Monday/Wednesday/Friday dialysis schedule to which he will be transitioned with hemodialysis tomorrow. 3. Anemia: Secondary to chronic kidney disease and recently inflammatory complex from surgery/wound.  Unclear if received ESA yesterday at [facility] as he did not have dialysis there. 4. CKD-MBD: Hyperphosphatemia noted, low corrected calcium.  Will increase dose of calcium acetate 3 times daily AC. 5. Nutrition: Continue renal diet with nutritional supplementation. 6. Hypertension: Blood pressures under acceptable control with propensity for intradialytic hypotension.  Subjective:   Reports uncomfortable night with intermittent abdominal pain.   Objective:   BP (!) 121/99 (BP Location: Right Arm)   Pulse 79   Temp 98.9 F (37.2 C) (Oral)   Resp 12   Ht 6\' 4"  (1.93 m)   Wt 92.8 kg   SpO2 97%   BMI 24.90 kg/m   Physical Exam: Gen: Appears to be comfortable resting in dialysis CVS: Pulse regular rhythm, normal rate, S1 and S2 normal Resp: Clear to auscultation bilaterally, no distinct rales or rhonchi.  Left IJ TDC. Abd: Soft, obese, clean/intact dressing over midline. Ext: Trace-1+ left lower extremity edema, trace right lower extremity edema.  Labs: BMET Recent Labs  Lab 09/20/19 1306 09/20/19 2310 09/21/19 0421 09/22/19 0323  NA 130* 130* 133* 131*  K >7.5* 4.9 5.7*  5.6*  CL 96* 94* 96* 97*  CO2 18* 23 27 23   GLUCOSE 87 108* 103* 118*  BUN 108* 55* 58* 64*  CREATININE 10.00* 6.02* 6.49* 7.12*  CALCIUM 8.1* 7.2* 7.4* 6.9*  PHOS  --   --  6.8* 6.5*   CBC Recent Labs  Lab 09/20/19 1306 09/21/19 0421 09/22/19 0323  WBC 12.6* 7.8 8.7  NEUTROABS 10.8*  --   --   HGB 9.9* 8.1* 8.5*  HCT 31.1* 25.0* 26.5*  MCV 97.5 96.2 96.4  PLT 594* 391 410*     Medications:    . atorvastatin  80 mg Oral Daily  . calcium acetate  667 mg Oral TID WC  . Chlorhexidine Gluconate Cloth  6 each Topical Daily  . darbepoetin (ARANESP) injection - DIALYSIS  100 mcg Intravenous Q Wed-HD  . fluticasone furoate-vilanterol  1 puff Inhalation Daily   Elmarie Shiley, MD 09/22/2019, 9:06 AM

## 2019-09-22 NOTE — Progress Notes (Signed)
Abdominal wound dressing changed per MD order without difficulty.  Will continue to monitor

## 2019-09-22 NOTE — Progress Notes (Signed)
ANTICOAGULATION CONSULT NOTE - Follow Up Consult  Pharmacy Consult for heparin Indication: atrial fibrillation  Labs: Recent Labs    09/20/19 1306 09/20/19 1306 09/20/19 1505 09/20/19 2310 09/21/19 0421 09/22/19 0323 09/22/19 1431 09/22/19 2243  HGB 9.9*   < >  --   --  8.1* 8.5*  --   --   HCT 31.1*  --   --   --  25.0* 26.5*  --   --   PLT 594*  --   --   --  391 410*  --   --   APTT  --   --   --   --   --   --  38* 52*  HEPARINUNFRC  --   --   --   --   --   --  1.44*  --   CREATININE 10.00*   < >  --  6.02* 6.49* 7.12*  --   --   TROPONINIHS 29*  --  28*  --   --   --   --   --    < > = values in this interval not displayed.    Assessment: 64yo male subtherapeutic on heparin with initial dosing while Eliquis on hold; no gtt issues or signs of bleeding per RN.  Goal of Therapy:  aPTT 66-102 seconds   Plan:  Will increase heparin gtt by 2 units/kg/hr to 1600 units/hr and check PTT in 8 hours.    Wynona Neat, PharmD, BCPS  09/22/2019,11:49 PM

## 2019-09-22 NOTE — Procedures (Signed)
Patient seen on Hemodialysis. BP (!) 121/99 (BP Location: Right Arm)   Pulse 79   Temp 98.9 F (37.2 C) (Oral)   Resp 12   Ht 6\' 4"  (1.93 m)   Wt 92.8 kg   SpO2 97%   BMI 24.90 kg/m   QB 400, UF goal 2L Tolerating treatment without complaints at this time.   Elmarie Shiley MD [facility]. Office # [phone] Pager # [phone] 9:39 AM

## 2019-09-22 NOTE — Progress Notes (Signed)
ANTICOAGULATION CONSULT NOTE - Initial Consult  Pharmacy Consult for heparin Indication: atrial fibrillation  No Known Allergies  Patient Measurements: Height: 6\' 4"  (193 cm) Weight: 90.7 kg (199 lb 15.3 oz) IBW/kg (Calculated) : 86.8 Heparin Dosing Weight: 86kg  Vital Signs: Temp: 98.4 F (36.9 C) (06/10 1241) Temp Source: Oral (06/10 1241) BP: 140/93 (06/10 1319) Pulse Rate: 125 (06/10 1241)  Labs: Recent Labs    09/20/19 1306 09/20/19 1306 09/20/19 1505 09/20/19 2310 09/21/19 0421 09/22/19 0323  HGB 9.9*   < >  --   --  8.1* 8.5*  HCT 31.1*  --   --   --  25.0* 26.5*  PLT 594*  --   --   --  391 410*  CREATININE 10.00*   < >  --  6.02* 6.49* 7.12*  TROPONINIHS 29*  --  28*  --   --   --    < > = values in this interval not displayed.    Estimated Creatinine Clearance: 13 mL/min (A) (by C-G formula based on SCr of 7.12 mg/dL (H)).   Medical History: Past Medical History:  Diagnosis Date  . Anxiety   . Arthritis    knees , back , shoulders (10/12/2017)  . Atrial fibrillation ([place])   . CAD (coronary artery disease)    Mild nonobstructive disease at cardiac catheterization 2002  . Chronic back pain    "back of my neck; down thru my legs" (10/12/2017)  . COPD (chronic obstructive pulmonary disease) ([place])   . Diverticulitis   . Esophageal reflux   . ESRD (end stage renal disease) on dialysis [facility])    "TTS; [place]" (11/23/2017)  . Essential hypertension   . Hepatitis C    states he no longer has this  . History of kidney stones   . History of syncope   . Hyperlipidemia   . Jerking 09/23/2014  . Myocardial infarction ([place]) 02/2017   "light one" (10/12/2017)  . Non-compliant behavior    non compliant with diaylsis per daughter  . PAT (paroxysmal atrial tachycardia) ([place])   . Peripheral arterial disease ([place])    Occluded left superficial femoral artery status post stent June 2016 - Dr. Trula Slade  . Pneumonia 1961  . Polycystic kidney, unspecified type   . Syncope  09/2014  . SYNCOPE 05/07/2010   Qualifier: Diagnosis of  By: Laurance Flatten, RN, BSN, Anderson Malta      Medications:  Medications Prior to Admission  Medication Sig Dispense Refill Last Dose  . albuterol (PROVENTIL HFA;VENTOLIN HFA) 108 (90 Base) MCG/ACT inhaler Inhale 2 puffs into the lungs every 6 (six) hours as needed for wheezing or shortness of breath.   unknown  . albuterol (PROVENTIL) (2.5 MG/3ML) 0.083% nebulizer solution Inhale 3 mLs into the lungs 3 (three) times daily as needed for shortness of breath or wheezing.   unknown  . apixaban (ELIQUIS) 5 MG TABS tablet Take 1 tablet (5 mg total) by mouth 2 (two) times daily. 60 tablet 1 unknown at Unknown time  . atorvastatin (LIPITOR) 80 MG tablet Take 80 mg by mouth daily.   unknown at Unknown time  . BREO ELLIPTA 200-25 MCG/INH AEPB Inhale 1 puff into the lungs daily as needed (for respiratory issues.).    unknown at Unknown time  . calcium acetate (PHOSLO) 667 MG capsule Take 667 mg by mouth 3 (three) times daily with meals.   unknown at Unknown time  . cephALEXin (KEFLEX) 250 MG capsule Take 250 mg by mouth daily. 7  day course starting on 09/12/2019.   unknown  . diltiazem (CARDIZEM CD) 360 MG 24 hr capsule Take 360 mg by mouth daily.   unknown at Unknown time  . docusate sodium (COLACE) 100 MG capsule Take 1 capsule (100 mg total) by mouth daily. 10 capsule 0 unknown at Unknown time  . furosemide (LASIX) 40 MG tablet Take 1 tablet (40 mg total) by mouth daily as needed for fluid or edema (Shortness of breath). 30 tablet 1 unknown at Unknown time  . gabapentin (NEURONTIN) 300 MG capsule Take 300 mg by mouth 2 (two) times daily.   unknown at Unknown time  . metoprolol tartrate (LOPRESSOR) 25 MG tablet Take 1 tablet (25 mg total) by mouth 2 (two) times daily. 180 tablet 1 unknown at Unknown time  . multivitamin (RENA-VIT) TABS tablet Take 1 tablet by mouth at bedtime. 90 tablet 1 unknown at Unknown time  . ondansetron (ZOFRAN) 4 MG tablet Take 1 tablet  (4 mg total) by mouth every 8 (eight) hours as needed for nausea. 20 tablet 0 unknown at Unknown time  . oxyCODONE-acetaminophen (PERCOCET) 10-325 MG tablet Take 1 tablet by mouth 5 (five) times daily as needed for pain.    unknown at Unknown time  . pantoprazole (PROTONIX) 40 MG tablet Take 1 tablet (40 mg total) by mouth 2 (two) times daily. (Patient taking differently: Take 40 mg by mouth daily. ) 60 tablet 1 unknown at Unknown time  . senna-docusate (SENOKOT-S) 8.6-50 MG tablet Take 1 tablet by mouth 2 (two) times daily as needed for moderate constipation.   unknown at Unknown time  . varenicline (CHANTIX) 0.5 MG tablet Take 0.5 mg by mouth 2 (two) times daily.   unknown at Unknown time  . Darbepoetin Alfa (ARANESP) 60 MCG/0.3ML SOSY injection Inject 0.3 mLs (60 mcg total) into the vein every Wednesday with hemodialysis. 4.2 mL     Scheduled:  . atorvastatin  80 mg Oral Daily  . calcium acetate  2,001 mg Oral TID WC  . Chlorhexidine Gluconate Cloth  6 each Topical Daily  . darbepoetin (ARANESP) injection - DIALYSIS  100 mcg Intravenous Q Wed-HD  . diltiazem  30 mg Oral Q6H  . doxycycline  100 mg Oral Q12H  . fluticasone furoate-vilanterol  1 puff Inhalation Daily  . gabapentin  200 mg Oral QHS  . metoprolol tartrate  25 mg Oral BID    Assessment: 64 yo male on apixaban PTA for afib (last dose not known). He is noted with SBO and pharmacy consulted to dose heparin, He is noted with ESRD -hg= 8.5  Goal of Therapy:  Heparin level 0.3-0.7 units/ml aPTT 66-102 seconds Monitor platelets by anticoagulation protocol: Yes   Plan:  -start heparin at 1400 units/hr -aPTT, heparin level in 8 hours -aPTT, heparin level and CBC daily  Hildred Laser, PharmD Clinical Pharmacist **Pharmacist phone directory can now be found on [email or web address] (PW [email or web address]).  Listed under [place].

## 2019-09-22 NOTE — Progress Notes (Signed)
Abdominal binder placed per MD order without difficulty.  Will continue to monitor

## 2019-09-23 DIAGNOSIS — E871 Hypo-osmolality and hyponatremia: Secondary | ICD-10-CM

## 2019-09-23 DIAGNOSIS — L03116 Cellulitis of left lower limb: Secondary | ICD-10-CM

## 2019-09-23 DIAGNOSIS — T148XXA Other injury of unspecified body region, initial encounter: Secondary | ICD-10-CM

## 2019-09-23 LAB — HEPARIN LEVEL (UNFRACTIONATED): Heparin Unfractionated: 1.3 IU/mL — ABNORMAL HIGH (ref 0.30–0.70)

## 2019-09-23 LAB — CBC
HCT: 26.4 % — ABNORMAL LOW (ref 39.0–52.0)
Hemoglobin: 8.3 g/dL — ABNORMAL LOW (ref 13.0–17.0)
MCH: 30.7 pg (ref 26.0–34.0)
MCHC: 31.4 g/dL (ref 30.0–36.0)
MCV: 97.8 fL (ref 80.0–100.0)
Platelets: 391 10*3/uL (ref 150–400)
RBC: 2.7 MIL/uL — ABNORMAL LOW (ref 4.22–5.81)
RDW: 16.3 % — ABNORMAL HIGH (ref 11.5–15.5)
WBC: 7.8 10*3/uL (ref 4.0–10.5)
nRBC: 0 % (ref 0.0–0.2)

## 2019-09-23 LAB — APTT: aPTT: 167 seconds (ref 24–36)

## 2019-09-23 LAB — MAGNESIUM: Magnesium: 1.5 mg/dL — ABNORMAL LOW (ref 1.7–2.4)

## 2019-09-23 MED ORDER — DILTIAZEM HCL ER COATED BEADS 180 MG PO CP24
180.0000 mg | ORAL_CAPSULE | Freq: Every day | ORAL | Status: DC
  Administered 2019-09-23 – 2019-09-27 (×5): 180 mg via ORAL
  Filled 2019-09-23 (×6): qty 1

## 2019-09-23 MED ORDER — APIXABAN 5 MG PO TABS
5.0000 mg | ORAL_TABLET | Freq: Two times a day (BID) | ORAL | Status: DC
  Administered 2019-09-23 – 2019-09-27 (×8): 5 mg via ORAL
  Filled 2019-09-23 (×9): qty 1

## 2019-09-23 MED ORDER — SODIUM CHLORIDE 0.9 % IV SOLN
1.0000 g | INTRAVENOUS | Status: DC
  Administered 2019-09-23 – 2019-09-26 (×4): 1 g via INTRAVENOUS
  Filled 2019-09-23 (×3): qty 1
  Filled 2019-09-23 (×2): qty 10
  Filled 2019-09-23: qty 1

## 2019-09-23 MED ORDER — HEPARIN (PORCINE) 25000 UT/250ML-% IV SOLN: 1450.0000 [IU]/h | INTRAVENOUS | Status: DC

## 2019-09-23 MED ORDER — MAGNESIUM SULFATE 2 GM/50ML IV SOLN
2.0000 g | Freq: Once | INTRAVENOUS | Status: AC
  Administered 2019-09-23: 2 g via INTRAVENOUS
  Filled 2019-09-23: qty 50

## 2019-09-23 MED ORDER — HYDROMORPHONE HCL 1 MG/ML IJ SOLN
INTRAMUSCULAR | Status: AC
  Filled 2019-09-23: qty 0.5

## 2019-09-23 MED ORDER — HEPARIN SODIUM (PORCINE) 1000 UNIT/ML IJ SOLN
INTRAMUSCULAR | Status: AC
  Administered 2019-09-23: 1000 [IU]
  Filled 2019-09-23: qty 5

## 2019-09-23 NOTE — Progress Notes (Signed)
PT Cancellation Note  Patient Details Name: Aaron [name] MRN: [id number] DOB: 13-Jan-1956   Cancelled Treatment:    Reason Eval/Treat Not Completed: Patient at procedure or test/unavailable Pt at HD. Will follow up as time allows.   Marguarite Arbour A Aarish Rockers 09/23/2019, 7:18 AM Marisa Severin, PT, DPT Acute Rehabilitation Services Pager [phone] Office [phone]

## 2019-09-23 NOTE — Procedures (Signed)
Patient was seen on dialysis and the procedure was supervised.  BFR 400  Via TDC BP is  135/82.  Patient appears to be tolerating treatment well.   Aaron [name] [name] 09/23/2019

## 2019-09-23 NOTE — Progress Notes (Signed)
PROGRESS NOTE  Aaron [name] LZJ:[id number] DOB: 10-23-1955   PCP: Gwenlyn Saran [place]  Patient is from: Home.  DOA: 09/20/2019 LOS: 3  Brief Narrative / Interim history: 64 y.o. male with history of ESRD, A. fib, COPD, diastolic CHF, recent hospitalization for SBO s/p ex lap with retention sutures for laparotomy wound presented to [facility] with acute on chronic difficulty breathing and productive cough. Patient missed 4 HD sessions, because of increasing pain, from his surgical incision in his abdomen.  He also reports abdominal distention.   He also had a fall at home where he landed on his backside.  Denies hitting his head or loss of consciousness.  On admission patient was found to have hyperkalemia to 7.5.  He was also in A. fib with RVR and started on Cardizem drip.  There is also concern about pneumonia for which he was started on antibiotics.  General surgery consulted for laparotomy wound dehiscence and possible infection.  He was transferred to [facility] for hemodialysis.   Subjective: Seen and examined earlier this morning while on dialysis.  Complaining about left foot pain that he describes as burning and throbbing after fall at home.  He thinks he bruised his big toe back.  He is also eager to go home.  Reports mild abdominal pain.  Denies chest pain, dyspnea, nausea or vomiting.   Objective: Vitals:   09/23/19 1030 09/23/19 1045 09/23/19 1100 09/23/19 1159  BP: 135/82 127/88 132/88 (!) 141/103  Pulse: 93 98 89 (!) 112  Resp: 13 20 14 20   Temp:   98.4 F (36.9 C) 98.8 F (37.1 C)  TempSrc:   Oral Oral  SpO2:   100% 93%  Weight:   88.4 kg   Height:        Intake/Output Summary (Last 24 hours) at 09/23/2019 1319 Last data filed at 09/23/2019 1045 Gross per 24 hour  Intake 360 ml  Output 2000 ml  Net -1640 ml   Filed Weights   09/23/19 0535 09/23/19 0710 09/23/19 1100  Weight: 91 kg 90.4 kg 88.4 kg    Examination:  GENERAL: No  apparent distress.  Nontoxic. HEENT: MMM.  Vision and hearing grossly intact.  NECK: Supple.  No apparent JVD.  RESP:  No IWOB.  Fair aeration bilaterally. CVS:  RRR. Heart sounds normal.  ABD/GI/GU: BS+. Abd soft.  Mild tenderness.  Dressing over laparotomy wound DCI. MSK/EXT:  Moves extremities. No apparent deformity.  Swelling, erythema and bruising over left foot as below. SKIN: Dressing over abdominal laparotomy wound DCI.  Swelling, erythema and increased warmth to touch over left foot.  Dry looking skin bruise over the plantar aspect of left big toe.  See picture below for more. NEURO: Sleepy but wakes to voice easily.  Oriented appropriately.  No apparent focal neuro deficit. PSYCH: Calm. Normal affect.               Procedures:  None  Microbiology summarized: COVID-19 PCR negative. Blood cultures NGTD.  Assessment & Plan: Severe hyperkalemia: K7.5 on admit.  Improved. ESRD/fluid overload  -Reportedly missed 4 HD sessions due to pain -Nephrology managing. -Need to ensure that he has not lost outpatient dialysis spot   A. fib with RVR-now rate controlled on p.o. metoprolol and Cardizem. -Consolidate Cardizem to 180 mg daily. -Continue metoprolol 25 mg twice daily -Continue IV heparin.  Will transition to home Eliquis on discharge.   Pneumonia?-Unlikely.  Patient has no fever or leukocytosis.  X-ray findings are  nonspecific and could be due to fluid overload in the setting of missed dialysis or atelectasis from shallow breathing due to surgical wound pain.  He has chronic productive cough from his COPD -Incentive spirometry  Left foot cellulitis-pain, erythema and increased warmth to touch.  No purulence.  X-ray without acute finding. -Restart IV ceftriaxone.  Acute on chronic respiratory failure with hypoxia-reported desaturation to upper 70s and lower 80s requiring 4 L to recover.  Likely due to fluid overload -Wean oxygen as able. -Fluid management with  hemodialysis. -COPD treatments as below.   SBO status post ex lap x2 with abdominal wall closure with retention sutures for fascial dehiscence previous hospitalization. -General surgery following, no plan for surgery. -Wet-to-dry dressing and abdominal binder   Decompensated diastolic CHF-patient has missed multiple hemodialysis treatments since discharge.  Now appears euvolemic after dialysis. -Fluid management with dialysis per nephrology.   Chronic COPD-patient with chronic productive cough. -Continue home breathing treatments -Continue doxycycline  Hypervolemic hyponatremia in the setting of ESRD and fluid overload -Manage with hemodialysis.  Hypomagnesemia: -Replenish and recheck.   Prolonged QTC-resolved on repeat EKG. -Optimize electrolytes -Minimize QT prolonging drugs   Fall at home-seems mechanical.  CT head and left foot x-ray without significant finding. -PT/OT eval           DVT prophylaxis: On heparin drip for A. fib Code Status: DNR/DNI Family Communication: Patient and/or RN. Available if any question.  Status is: Inpatient  Remains inpatient appropriate because:IV treatments appropriate due to intensity of illness or inability to take PO, Inpatient level of care appropriate due to severity of illness and outpatient dialysis spot   Dispo: The patient is from: Home              Anticipated d/c is to: To be determined.  Patient likes to go home but lives alone. Not sure if he can care for himself              Anticipated d/c date is: 2 days              Patient currently is not medically stable to d/c.       Consultants:  Nephrology General surgery   Sch Meds:  Scheduled Meds: . atorvastatin  80 mg Oral Daily  . calcium acetate  2,001 mg Oral TID WC  . Chlorhexidine Gluconate Cloth  6 each Topical Daily  . darbepoetin (ARANESP) injection - DIALYSIS  100 mcg Intravenous Q Wed-HD  . diltiazem  30 mg Oral Q6H  . doxycycline  100 mg Oral Q12H   . fluticasone furoate-vilanterol  1 puff Inhalation Daily  . gabapentin  200 mg Oral QHS  . metoprolol tartrate  25 mg Oral BID   Continuous Infusions: . heparin 1,600 Units/hr (09/23/19 1150)   PRN Meds:.acetaminophen **OR** acetaminophen, HYDROmorphone (DILAUDID) injection, ipratropium-albuterol, polyethylene glycol  Antimicrobials: Anti-infectives (From admission, onward)   Start     Dose/Rate Route Frequency Ordered Stop   09/22/19 2200  doxycycline (VIBRA-TABS) tablet 100 mg     Discontinue     100 mg Oral Every 12 hours 09/22/19 1348 09/25/19 2159   09/20/19 2000  doxycycline (VIBRAMYCIN) 100 mg in sodium chloride 0.9 % 250 mL IVPB  Status:  Discontinued        100 mg 125 mL/hr over 120 Minutes Intravenous Every 12 hours 09/20/19 1944 09/22/19 1348   09/20/19 1950  cefTRIAXone (ROCEPHIN) 1 g in sodium chloride 0.9 % 100 mL IVPB  Status:  Discontinued  1 g 200 mL/hr over 30 Minutes Intravenous Every 24 hours 09/20/19 1943 09/22/19 1347       I have personally reviewed the following labs and images: CBC: Recent Labs  Lab 09/20/19 1306 09/21/19 0421 09/22/19 0323 09/23/19 0342  WBC 12.6* 7.8 8.7 7.8  NEUTROABS 10.8*  --   --   --   HGB 9.9* 8.1* 8.5* 8.3*  HCT 31.1* 25.0* 26.5* 26.4*  MCV 97.5 96.2 96.4 97.8  PLT 594* 391 410* 391   BMP &GFR Recent Labs  Lab 09/20/19 1306 09/20/19 2310 09/21/19 0421 09/22/19 0323 09/23/19 0342  NA 130* 130* 133* 131* 135  K >7.5* 4.9 5.7* 5.6* 4.4  CL 96* 94* 96* 97* 100  CO2 18* 23 27 23 26   GLUCOSE 87 108* 103* 118* 109*  BUN 108* 55* 58* 64* 31*  CREATININE 10.00* 6.02* 6.49* 7.12* 4.50*  CALCIUM 8.1* 7.2* 7.4* 6.9* 6.9*  MG 2.1  --   --   --  1.5*  PHOS  --   --  6.8* 6.5* 3.5   Estimated Creatinine Clearance: 20.6 mL/min (A) (by C-G formula based on SCr of 4.5 mg/dL (H)). Liver & Pancreas: Recent Labs  Lab 09/20/19 1306 09/22/19 0323 09/23/19 0342  AST 48*  --   --   ALT 8  --   --   ALKPHOS 148*  --    --   BILITOT 0.5  --   --   PROT 6.3*  --   --   ALBUMIN 2.0* 1.4* 1.2*   No results for input(s): LIPASE, AMYLASE in the last 168 hours. No results for input(s): AMMONIA in the last 168 hours. Diabetic: No results for input(s): HGBA1C in the last 72 hours. Recent Labs  Lab 09/20/19 1522  GLUCAP 72   Cardiac Enzymes: No results for input(s): CKTOTAL, CKMB, CKMBINDEX, TROPONINI in the last 168 hours. No results for input(s): PROBNP in the last 8760 hours. Coagulation Profile: No results for input(s): INR, PROTIME in the last 168 hours. Thyroid Function Tests: No results for input(s): TSH, T4TOTAL, FREET4, T3FREE, THYROIDAB in the last 72 hours. Lipid Profile: No results for input(s): CHOL, HDL, LDLCALC, TRIG, CHOLHDL, LDLDIRECT in the last 72 hours. Anemia Panel: No results for input(s): VITAMINB12, FOLATE, FERRITIN, TIBC, IRON, RETICCTPCT in the last 72 hours. Urine analysis:    Component Value Date/Time   COLORURINE YELLOW 05/27/2019 0524   APPEARANCEUR CLEAR 05/27/2019 0524   LABSPEC 1.025 05/27/2019 0524   PHURINE 6.0 05/27/2019 0524   GLUCOSEU 150 (A) 05/27/2019 0524   HGBUR MODERATE (A) 05/27/2019 0524   BILIRUBINUR NEGATIVE 05/27/2019 0524   KETONESUR NEGATIVE 05/27/2019 0524   PROTEINUR 100 (A) 05/27/2019 0524   UROBILINOGEN 0.2 03/03/2010 1056   NITRITE NEGATIVE 05/27/2019 0524   LEUKOCYTESUR TRACE (A) 05/27/2019 0524   Sepsis Labs: Invalid input(s): PROCALCITONIN, [place]  Microbiology: Recent Results (from the past 240 hour(s))  Blood culture (routine x 2)     Status: None (Preliminary result)   Collection Time: 09/20/19  1:06 PM   Specimen: BLOOD LEFT WRIST  Result Value Ref Range Status   Specimen Description BLOOD LEFT WRIST  Final   Special Requests   Final    BOTTLES DRAWN AEROBIC ONLY Blood Culture adequate volume   Culture   Final    NO GROWTH 3 DAYS Performed at [facility], [address], [place], [place] [address]    Report Status  PENDING  Incomplete  Blood culture (routine x 2)  Status: None (Preliminary result)   Collection Time: 09/20/19  1:17 PM   Specimen: Right Antecubital; Blood  Result Value Ref Range Status   Specimen Description RIGHT ANTECUBITAL  Final   Special Requests   Final    BOTTLES DRAWN AEROBIC AND ANAEROBIC Blood Culture adequate volume   Culture   Final    NO GROWTH 3 DAYS Performed at [facility], [address] [place], [place] [address]    Report Status PENDING  Incomplete  SARS Coronavirus 2 by RT PCR (hospital order, performed in [place] hospital lab) Nasopharyngeal Nasopharyngeal Swab     Status: None   Collection Time: 09/20/19  3:37 PM   Specimen: Nasopharyngeal Swab  Result Value Ref Range Status   SARS Coronavirus 2 NEGATIVE NEGATIVE Final    Comment: (NOTE) SARS-CoV-2 target nucleic acids are NOT DETECTED. The SARS-CoV-2 RNA is generally detectable in upper and lower respiratory specimens during the acute phase of infection. The lowest concentration of SARS-CoV-2 viral copies this assay can detect is 250 copies / mL. A negative result does not preclude SARS-CoV-2 infection and should not be used as the sole basis for treatment or other patient management decisions.  A negative result may occur with improper specimen collection / handling, submission of specimen other than nasopharyngeal swab, presence of viral mutation(s) within the areas targeted by this assay, and inadequate number of viral copies (<250 copies / mL). A negative result must be combined with clinical observations, patient history, and epidemiological information. Fact Sheet for Patients:   StrictlyIdeas.no Fact Sheet for Healthcare Providers: BankingDealers.co.za This test is not yet approved or cleared  by the [place] FDA and has been authorized for detection and/or diagnosis of SARS-CoV-2 by FDA under an Emergency Use Authorization (EUA).  This  EUA will remain in effect (meaning this test can be used) for the duration of the COVID-19 declaration under Section 564(b)(1) of the Act, 21 U.S.C. section 360bbb-3(b)(1), unless the authorization is terminated or revoked sooner. Performed at [facility], [address], [place], [place] [address]     Radiology Studies: No results found.   Quanisha Drewry T. [place]  If 7PM-7AM, please contact night-coverage [email or web address] Password [email or web address] 09/23/2019, 1:19 PM

## 2019-09-23 NOTE — Progress Notes (Signed)
OT Cancellation Note  Patient Details Name: Aaron [name] MRN: [id number] DOB: 12-Aug-1955   Cancelled Treatment:    Reason Eval/Treat Not Completed: Patient at procedure or test/ unavailable. HD  Aaron [name],[name] 09/23/2019, 8:00 AM  Maurie Boettcher, OT/L   Acute OT Clinical Specialist Acute Rehabilitation Services Pager [phone] Office [phone]

## 2019-09-23 NOTE — Evaluation (Signed)
Physical Therapy Evaluation Patient Details Name: Aaron [name] MRN: [id number] DOB: [birth year] Today's Date: 09/23/2019   History of Present Illness  Patient is a 64 y/o male who presents with SOB, cough and fall. Admitted with hyperkalemia due to 4 missed HD sessions. Also wuth A-fib with RVR, PNA and infected abdominal wound. Recently hospitalized 4/40-3/47 with complications of SBO s/p lap exp 5/17 with repeat 5/24. PMH includes A-fib with RVR, HTN, COPD, ESRD on HD, CHF.  Clinical Impression  Patient presents with pain, generalized weakness, decreased activity tolerance and impaired mobility s/p above. Pt lives alone and reports using RW for ambulation PTA. Pt with recent fall resulting in bruising and pain at left heel/ankle and toe. This injury is impacting mobility assessment today. Tolerated standing and taking a few steps to get to chair with Min guard assist and use of RW. Reluctant to place weight through LLE due to pain. VSS on supplemental 02. Pt with productive cough during session. Will follow acutely to maximize independence and mobility prior to return home.    Follow Up Recommendations Home health PT;Supervision for mobility/OOB    Equipment Recommendations  None recommended by PT    Recommendations for Other Services       Precautions / Restrictions Precautions Precautions: Fall Precaution Comments: large abdominal incision Required Braces or Orthoses: Other Brace Other Brace: abdominal binder Restrictions Weight Bearing Restrictions: No      Mobility  Bed Mobility Overal bed mobility: Needs Assistance Bed Mobility: Supine to Sit     Supine to sit: Supervision;HOB elevated     General bed mobility comments: No assist needed. Use of rail. No dizziness.  Transfers Overall transfer level: Needs assistance Equipment used: Rolling walker (2 wheeled) Transfers: Sit to/from [facility] Sit to Stand: Min guard Stand pivot transfers: Min  guard       General transfer comment: Min guard for safety. Reluctant to place weight through LLE, heavy reliance on UEs and RW. Able to take a few steps tog et to chair with RW; pain through LLE with WB.  Ambulation/Gait             General Gait Details: Deferred due to pain in LLE and pt wanting to eat lunch post dialysis.  Stairs            Wheelchair Mobility    Modified Rankin (Stroke Patients Only)       Balance Overall balance assessment: Needs assistance Sitting-balance support: Feet supported;No upper extremity supported Sitting balance-Leahy Scale: Good Sitting balance - Comments: total A to donn socks sitting EOB due to abdominal incision   Standing balance support: During functional activity Standing balance-Leahy Scale: Poor Standing balance comment: Reliant on BUE support and RW                             Pertinent Vitals/Pain Pain Assessment: Faces Faces Pain Scale: Hurts even more Pain Location: left heel Pain Descriptors / Indicators: Sore;Guarding;Grimacing Pain Intervention(s): Monitored during session;Repositioned;Limited activity within patient's tolerance    Home Living Family/patient expects to be discharged to:: Private residence Living Arrangements: Alone Available Help at Discharge: Family Type of Home: Apartment Home Access: Level entry     Home Layout: One level Home Equipment: Cane - single point;Grab bars - tub/shower;Grab bars - toilet;Bedside commode;Shower seat;Wheelchair - [employer] - 4 wheels;Walker - 2 wheels;Walker - standard Additional Comments: per pt  they have family to come in and out to  assist some    Prior Function Level of Independence: Independent with assistive device(s)         Comments: uses RW for ambulation; Reports pain in left heel fromf all and difficulty walking. Sleeps in recliner.     Hand Dominance   Dominant Hand: Right    Extremity/Trunk Assessment   Upper Extremity  Assessment Upper Extremity Assessment: Defer to OT evaluation    Lower Extremity Assessment Lower Extremity Assessment: LLE deficits/detail LLE Deficits / Details: Ecchymosis on heel and open wound on hallux LLE Sensation: decreased light touch (distal to knee and into foot)    Cervical / Trunk Assessment Cervical / Trunk Assessment: Normal  Communication   Communication: No difficulties  Cognition Arousal/Alertness: Awake/alert Behavior During Therapy: WFL for tasks assessed/performed Overall Cognitive Status: Within Functional Limits for tasks assessed                                 General Comments: poor awareness of medical condition and need for HD despite being in abdominal pain      General Comments General comments (skin integrity, edema, etc.): VSS on supplemental 02. BP stable, no dizziness.    Exercises     Assessment/Plan    PT Assessment Patient needs continued PT services  PT Problem List Decreased strength;Decreased balance;Decreased mobility;Decreased activity tolerance;Decreased knowledge of use of DME;Cardiopulmonary status limiting activity;Decreased range of motion;Decreased skin integrity;Pain;Impaired sensation       PT Treatment Interventions DME instruction;Gait training;Functional mobility training;Therapeutic exercise;Therapeutic activities;Balance training;Patient/family education    PT Goals (Current goals can be found in the Care Plan section)  Acute Rehab PT Goals Patient Stated Goal: to get the heck out of here and have someone take my foot injury seriously PT Goal Formulation: With patient Time For Goal Achievement: 10/07/19 Potential to Achieve Goals: Good    Frequency Min 3X/week   Barriers to discharge Decreased caregiver support lives alone but has family check on him    Co-evaluation               AM-PAC PT "6 Clicks" Mobility  Outcome Measure Help needed turning from your back to your side while in a flat  bed without using bedrails?: None Help needed moving from lying on your back to sitting on the side of a flat bed without using bedrails?: None Help needed moving to and from a bed to a chair (including a wheelchair)?: A Little Help needed standing up from a chair using your arms (e.g., wheelchair or bedside chair)?: A Little Help needed to walk in hospital room?: A Little Help needed climbing 3-5 steps with a railing? : A Lot 6 Click Score: 19    End of Session Equipment Utilized During Treatment: Gait belt Activity Tolerance: Patient limited by pain;Patient tolerated treatment well Patient left: in chair;with call bell/phone within reach (chair alarm pad under pt but no plugged in) Nurse Communication: Mobility status PT Visit Diagnosis: Other abnormalities of gait and mobility (R26.89);Muscle weakness (generalized) (M62.81);Difficulty in walking, not elsewhere classified (R26.2);Pain Pain - Right/Left: Left Pain - part of body: Ankle and joints of foot    Time: 1136-1201 PT Time Calculation (min) (ACUTE ONLY): 25 min   Charges:   PT Evaluation $PT Eval Moderate Complexity: 1 Mod PT Treatments $Therapeutic Activity: 8-22 mins        Marisa Severin, PT, DPT Acute Rehabilitation Services Pager [phone] Office [phone]  Marguarite Arbour A Mykael Batz 09/23/2019, 12:56 PM

## 2019-09-23 NOTE — Discharge Instructions (Signed)
MIDLINE WOUND CARE: - midline dressing to be changed twice daily - supplies: sterile saline, kerlix, scissors, ABD pads, tape  - remove dressing and all packing carefully, moistening with sterile saline as needed to avoid packing/internal dressing sticking to the wound. - clean edges of skin around the wound with water/gauze, making sure there is no tape debris or leakage left on skin that could cause skin irritation or breakdown. - dampen and clean kerlix with sterile saline and pack wound from wound base to skin level, making sure to take note of any possible areas of wound tracking, tunneling and packing appropriately. Wound can be packed loosely. Trim kerlix to size if a whole kerlix is not required. - cover wound with a dry ABD pad and secure with tape.  - write the date/time on the dry dressing/tape to better track when the last dressing change occurred. - apply any skin protectant/powder recommended by clinician to protect skin/skin folds. - change dressing as needed if leakage occurs, wound gets contaminated, or patient requests to shower. - patient may shower daily with wound open and following the shower the wound should be dried and a clean dressing placed.  - continue wearing abdominal binder   ====================================================================================  Information on my medicine - ELIQUIS (apixaban)  Why was Eliquis prescribed for you? Eliquis was prescribed for you to reduce the risk of a blood clot forming that can cause a stroke if you have a medical condition called atrial fibrillation (a type of irregular heartbeat).  What do You need to know about Eliquis ? Take your Eliquis TWICE DAILY - one tablet in the morning and one tablet in the evening with or without food. If you have difficulty swallowing the tablet whole please discuss with your pharmacist how to take the medication safely.  Take Eliquis exactly as prescribed by your doctor and DO  NOT stop taking Eliquis without talking to the doctor who prescribed the medication.  Stopping may increase your risk of developing a stroke.  Refill your prescription before you run out.  After discharge, you should have regular check-up appointments with your healthcare provider that is prescribing your Eliquis.  In the future your dose may need to be changed if your kidney function or weight changes by a significant amount or as you get older.  What do you do if you miss a dose? If you miss a dose, take it as soon as you remember on the same day and resume taking twice daily.  Do not take more than one dose of ELIQUIS at the same time to make up a missed dose.  Important Safety Information A possible side effect of Eliquis is bleeding. You should call your healthcare provider right away if you experience any of the following: ? Bleeding from an injury or your nose that does not stop. ? Unusual colored urine (red or dark brown) or unusual colored stools (red or black). ? Unusual bruising for unknown reasons. ? A serious fall or if you hit your head (even if there is no bleeding).  Some medicines may interact with Eliquis and might increase your risk of bleeding or clotting while on Eliquis. To help avoid this, consult your healthcare provider or pharmacist prior to using any new prescription or non-prescription medications, including herbals, vitamins, non-steroidal anti-inflammatory drugs (NSAIDs) and supplements.  This website has more information on Eliquis (apixaban): http://[email or web address].com/eliquis/home

## 2019-09-23 NOTE — Progress Notes (Signed)
[facility] Progress Note     Subjective: CC-  In HD. Feeling ok. Abdominal wound is still sore, otherwise denies abdominal pain, nausea, vomiting. Tolerating diet, BM yesterday.  Objective: Vital signs in last 24 hours: Temp:  [97.9 F (36.6 C)-99.7 F (37.6 C)] 99.3 F (37.4 C) (06/11 0000) Pulse Rate:  [67-125] 99 (06/11 0000) Resp:  [12-20] 15 (06/11 0535) BP: (107-150)/(72-116) 107/72 (06/11 0000) SpO2:  [95 %-100 %] 99 % (06/11 0535) Weight:  [90.7 kg-91 kg] 91 kg (06/11 0535) Last BM Date: 09/22/19  Intake/Output from previous day: 06/10 0701 - 06/11 0700 In: 360 [P.O.:360] Out: 2000  Intake/Output this shift: No intake/output data recorded.  PE: Gen:  Alert, NAD, pleasant Card:  RRR Pulm:  rate and effort normal Abd: Soft, distended, +BS, tender over incision otherwise abdomen nontender, no peritonitis, open midline wound with fibrinous tissue, 3 retention sutures in place but the middle one is pulling through, there is a small opening at the proximal/left lateral side with some dehiscence and serous fluid draining      Lab Results:  Recent Labs    09/22/19 0323 09/23/19 0342  WBC 8.7 7.8  HGB 8.5* 8.3*  HCT 26.5* 26.4*  PLT 410* 391   BMET Recent Labs    09/22/19 0323 09/23/19 0342  NA 131* 135  K 5.6* 4.4  CL 97* 100  CO2 23 26  GLUCOSE 118* 109*  BUN 64* 31*  CREATININE 7.12* 4.50*  CALCIUM 6.9* 6.9*   PT/INR No results for input(s): LABPROT, INR in the last 72 hours. CMP     Component Value Date/Time   NA 135 09/23/2019 0342   K 4.4 09/23/2019 0342   CL 100 09/23/2019 0342   CO2 26 09/23/2019 0342   GLUCOSE 109 (H) 09/23/2019 0342   BUN 31 (H) 09/23/2019 0342   CREATININE 4.50 (H) 09/23/2019 0342   CALCIUM 6.9 (L) 09/23/2019 0342   PROT 6.3 (L) 09/20/2019 1306   ALBUMIN 1.2 (L) 09/23/2019 0342   AST 48 (H) 09/20/2019 1306   ALT 8 09/20/2019 1306   ALKPHOS 148 (H) 09/20/2019 1306   BILITOT 0.5 09/20/2019 1306    GFRNONAA 13 (L) 09/23/2019 0342   GFRAA 15 (L) 09/23/2019 0342   Lipase     Component Value Date/Time   LIPASE 23 08/25/2019 1740       Studies/Results: No results found.  Anti-infectives: Anti-infectives (From admission, onward)   Start     Dose/Rate Route Frequency Ordered Stop   09/22/19 2200  doxycycline (VIBRA-TABS) tablet 100 mg     Discontinue     100 mg Oral Every 12 hours 09/22/19 1348 09/25/19 2159   09/20/19 2000  doxycycline (VIBRAMYCIN) 100 mg in sodium chloride 0.9 % 250 mL IVPB  Status:  Discontinued        100 mg 125 mL/hr over 120 Minutes Intravenous Every 12 hours 09/20/19 1944 09/22/19 1348   09/20/19 1950  cefTRIAXone (ROCEPHIN) 1 g in sodium chloride 0.9 % 100 mL IVPB  Status:  Discontinued        1 g 200 mL/hr over 30 Minutes Intravenous Every 24 hours 09/20/19 1943 09/22/19 1347       Assessment/Plan CHF COPD A fib - eliquis on hold Polycystic kidney disease, ESRD, on PD - converted to HD  H/O MI - plavix on hold CAD Hep C HLD Pneumonia? Hyperkalemia  SBO -Exploratory laparotomy, LOA, repair of SB serosal injury x3, resection of mesenteric nodule 08/29/19 Dr.  LovickPOD# 24 -Exploratory laparotomy, washout, abdominal wall closure with placement of retention sutures for fascial dehiscence5/24/21 Dr. Loreli Dollar - Wound is stable, pictured above. Continue BID wet to dry dressing changes. Continue abdominal binder. We will be available over the weekend as needed, and plan to see him again on Monday if he is still here. Follow up info on AVS.  FEN - soft diet VTE - SCDs ID -rocephin and doxycycline for PNA Foley - none  Follow up - Dr. Bobbye Morton   LOS: 3 days    Wellington Hampshire, [facility] 09/23/2019, 7:33 AM Please see Amion for pager number during day hours 7:00am-4:30pm

## 2019-09-23 NOTE — Progress Notes (Signed)
Orthopedic Tech Progress Note Patient Details:  SHAYN MADOLE 01-Jun-1955 [id number]  Ortho Devices Type of Ortho Device: Postop shoe/boot Ortho Device/Splint Location: Lower Left extremity Ortho Device/Splint Interventions: Ordered, Application   Post Interventions Patient Tolerated: Well Instructions Provided: Adjustment of device, Care of device, Poper ambulation with device   Tammy Sours 09/23/2019, 7:37 PM

## 2019-09-24 DIAGNOSIS — D631 Anemia in chronic kidney disease: Secondary | ICD-10-CM

## 2019-09-24 DIAGNOSIS — N189 Chronic kidney disease, unspecified: Secondary | ICD-10-CM

## 2019-09-24 LAB — RENAL FUNCTION PANEL
Albumin: 1.2 g/dL — ABNORMAL LOW (ref 3.5–5.0)
Albumin: 1.3 g/dL — ABNORMAL LOW (ref 3.5–5.0)
Anion gap: 9 (ref 5–15)
Anion gap: 10 (ref 5–15)
BUN: 31 mg/dL — ABNORMAL HIGH (ref 8–23)
BUN: 23 mg/dL (ref 8–23)
CO2: 26 mmol/L (ref 22–32)
CO2: 26 mmol/L (ref 22–32)
Calcium: 6.9 mg/dL — ABNORMAL LOW (ref 8.9–10.3)
Calcium: 6.6 mg/dL — ABNORMAL LOW (ref 8.9–10.3)
Chloride: 100 mmol/L (ref 98–111)
Chloride: 99 mmol/L (ref 98–111)
Creatinine, Ser: 4.5 mg/dL — ABNORMAL HIGH (ref 0.61–1.24)
Creatinine, Ser: 3.84 mg/dL — ABNORMAL HIGH (ref 0.61–1.24)
GFR calc Af Amer: 15 mL/min — ABNORMAL LOW (ref >60)
GFR calc Af Amer: 18 mL/min — ABNORMAL LOW (ref >60)
GFR calc non Af Amer: 13 mL/min — ABNORMAL LOW (ref >60)
GFR calc non Af Amer: 16 mL/min — ABNORMAL LOW (ref >60)
Glucose, Bld: 109 mg/dL — ABNORMAL HIGH (ref 70–99)
Glucose, Bld: 121 mg/dL — ABNORMAL HIGH (ref 70–99)
Phosphorus: 3.5 mg/dL (ref 2.5–4.6)
Phosphorus: 2.5 mg/dL (ref 2.5–4.6)
Potassium: 4.4 mmol/L (ref 3.5–5.1)
Potassium: 4 mmol/L (ref 3.5–5.1)
Sodium: 135 mmol/L (ref 135–145)
Sodium: 135 mmol/L (ref 135–145)

## 2019-09-24 LAB — HEMOGLOBIN AND HEMATOCRIT, BLOOD
HCT: 27.1 % — ABNORMAL LOW (ref 39.0–52.0)
Hemoglobin: 8.4 g/dL — ABNORMAL LOW (ref 13.0–17.0)

## 2019-09-24 LAB — MAGNESIUM: Magnesium: 1.8 mg/dL (ref 1.7–2.4)

## 2019-09-24 MED ORDER — DICLOFENAC SODIUM 1 % EX GEL
2.0000 g | Freq: Four times a day (QID) | CUTANEOUS | Status: DC
  Administered 2019-09-24 – 2019-09-27 (×12): 2 g via TOPICAL
  Filled 2019-09-24: qty 100

## 2019-09-24 MED ORDER — CALCIUM ACETATE (PHOS BINDER) 667 MG PO CAPS
1334.0000 mg | ORAL_CAPSULE | Freq: Three times a day (TID) | ORAL | Status: DC
  Administered 2019-09-24 – 2019-09-27 (×9): 1334 mg via ORAL
  Filled 2019-09-24 (×9): qty 2

## 2019-09-24 MED ORDER — OXYCODONE HCL 5 MG PO TABS
5.0000 mg | ORAL_TABLET | Freq: Three times a day (TID) | ORAL | Status: DC | PRN
  Administered 2019-09-24 – 2019-09-27 (×7): 5 mg via ORAL
  Filled 2019-09-24 (×8): qty 1

## 2019-09-24 MED ORDER — HYDROMORPHONE HCL 1 MG/ML IJ SOLN
0.5000 mg | INTRAMUSCULAR | Status: DC | PRN
  Administered 2019-09-24 – 2019-09-27 (×14): 0.5 mg via INTRAVENOUS
  Filled 2019-09-24 (×14): qty 1

## 2019-09-24 MED ORDER — ONDANSETRON HCL 4 MG/2ML IJ SOLN
4.0000 mg | Freq: Four times a day (QID) | INTRAMUSCULAR | Status: DC | PRN
  Administered 2019-09-25 (×2): 4 mg via INTRAVENOUS
  Filled 2019-09-24 (×2): qty 2

## 2019-09-24 MED ORDER — ACETAMINOPHEN 500 MG PO TABS
1000.0000 mg | ORAL_TABLET | Freq: Three times a day (TID) | ORAL | Status: DC
  Administered 2019-09-24 – 2019-09-25 (×4): 1000 mg via ORAL
  Filled 2019-09-24 (×9): qty 2

## 2019-09-24 NOTE — Progress Notes (Signed)
PROGRESS NOTE  Aaron [name] TIR:[id number] DOB: [birth year]   PCP: Gwenlyn Saran Navarre  Patient is from: Home.  DOA: 09/20/2019 LOS: 4  Brief Narrative / Interim history: 64 y.o. male with history of ESRD, A. fib, COPD, diastolic CHF, recent hospitalization for SBO s/p ex lap with retention sutures for laparotomy wound presented to [facility] with acute on chronic difficulty breathing and productive cough. Patient missed 4 HD sessions, because of increasing pain, from his surgical incision in his abdomen.  He also reports abdominal distention.   He also had a fall at home where he landed on his backside.  Denies hitting his head or loss of consciousness.  On admission patient was found to have hyperkalemia to 7.5.  He was also in A. fib with RVR and started on Cardizem drip.  There is also concern about pneumonia for which he was started on antibiotics.  General surgery consulted for laparotomy wound dehiscence and possible infection.  He was transferred to [facility] for hemodialysis.   Patient received back-to-back dialysis on 6/13 and 6/11.  Waiting on outpatient dialysis spot.  Subjective: Seen and examined earlier this morning.  No major events overnight of this morning.  Eager to go home.  He threatens to sign himself out if he is not going to be released.  He understand that he could die if he leaves without dialysis spot secured. He says he will call hospice if he gets worse.  I offered him an urgent palliative care consult if he would like to pursue hospice path. He became tearful and didn't continue the conversation.  He shifted the focus to his left foot and knee pain.  Asking for stronger pain medications.   Objective: Vitals:   09/24/19 0422 09/24/19 0756 09/24/19 0802 09/24/19 1201  BP: 126/85 137/78  132/79  Pulse: 89 86  67  Resp: 18 15  19   Temp: 98.4 F (36.9 C) 99 F (37.2 C)  98.1 F (36.7 C)  TempSrc: Oral Oral  Oral  SpO2: 100% 97% 95%  91%  Weight: 90.6 kg     Height:        Intake/Output Summary (Last 24 hours) at 09/24/2019 1217 Last data filed at 09/24/2019 1100 Gross per 24 hour  Intake 440 ml  Output --  Net 440 ml   Filed Weights   09/23/19 0710 09/23/19 1100 09/24/19 0422  Weight: 90.4 kg 88.4 kg 90.6 kg    Examination:  GENERAL: No apparent distress.  Nontoxic. HEENT: MMM.  Vision and hearing grossly intact.  NECK: Supple.  No apparent JVD.  RESP: On room air.  No IWOB.  Fair aeration bilaterally. CVS:  RRR. Heart sounds normal.  ABD/GI/GU: BS+. Abd soft, NTND.  Abdominal binder in place. MSK/EXT:  Moves extremities.  Mild swelling in left foot with some bruise over the plantar aspect of left toe.  Erythema improved. SKIN: Dressing and abdominal binder over laparotomy wound.  Swelling and mild erythema over left foot. NEURO: Awake, alert and oriented appropriately.  No apparent focal neuro deficit. PSYCH: Somewhat upset about not being able to go home   Procedures:  None  Microbiology summarized: COVID-19 PCR negative. Blood cultures NGTD.  Assessment & Plan: Severe hyperkalemia: K7.5 on admit.  Improved. ESRD/fluid overload  -Reportedly missed 4 HD sessions due to pain prior to admission -It appears he has lost his outpatient dialysis spot. -Nephrology managing.   A. fib with RVR-now rate controlled on p.o. metoprolol and Cardizem. -Continue  metoprolol 25 mg twice daily and Cardizem CD 180 mg daily -Transitioned to Eliquis.   Pneumonia?-Unlikely.  Patient has no fever or leukocytosis.  X-ray findings are nonspecific and could be due to fluid overload in the setting of missed dialysis or atelectasis from shallow breathing due to surgical wound pain.  -On ceftriaxone for left lower extremity cellulitis -Incentive spirometry  Left foot cellulitis/pain, erythema and increased warmth to touch.  No purulence.  X-ray without acute finding. -Continue ceftriaxone IV ceftriaxone. -Scheduled  Tylenol with as needed oxycodone for pain -Voltaren gel over left knee  Acute on chronic respiratory failure with hypoxia-reported desaturation to upper 70s and lower 80s requiring 4 L to recover.  Likely due to fluid overload.  Resolved. -Fluid management with hemodialysis. -COPD treatments as below.   SBO status post ex lap x2 with abdominal wall closure with retention sutures for fascial dehiscence previous hospitalization. -General surgery following, no plan for surgery. Wet-to-dry dressing and abdominal binder   Acute on chronic diastolic CHF-patient has missed multiple hemodialysis treatments since discharge.  Now appears euvolemic after dialysis back-to-back. -Fluid management with dialysis per nephrology.   Chronic COPD-patient with chronic productive cough. -Continue home breathing treatments -Antibiotics as above.  Anemia of renal disease: Hgb stable -Per nephrology.  Hypervolemic hyponatremia in the setting of ESRD and fluid overload: Resolved.  Hyperkalemia: Resolved.  Hypomagnesemia: Resolved. -Monitor and replete as appropriate   Prolonged QTC-resolved on repeat EKG. -Optimize electrolytes -Minimize QT prolonging drugs   Fall at home-seems mechanical.  CT head and left foot x-ray without significant finding. -PT/OT eval           DVT prophylaxis: On Eliquis for A. fib. Code Status: DNR/DNI Family Communication: Patient and/or RN. Available if any question.  Status is: Inpatient  Remains inpatient appropriate because:outpatient dialysis spot   Dispo: The patient is from: Home              Anticipated d/c is to: Home.               Anticipated d/c date is: 2 days              Patient currently is medically stable to d/c.       Consultants:  Nephrology General surgery   Sch Meds:  Scheduled Meds: . acetaminophen  1,000 mg Oral Q8H  . apixaban  5 mg Oral BID  . atorvastatin  80 mg Oral Daily  . calcium acetate  1,334 mg Oral TID WC  .  Chlorhexidine Gluconate Cloth  6 each Topical Daily  . darbepoetin (ARANESP) injection - DIALYSIS  100 mcg Intravenous Q Wed-HD  . diclofenac Sodium  2 g Topical QID  . diltiazem  180 mg Oral Daily  . fluticasone furoate-vilanterol  1 puff Inhalation Daily  . gabapentin  200 mg Oral QHS  . metoprolol tartrate  25 mg Oral BID   Continuous Infusions: . cefTRIAXone (ROCEPHIN)  IV 1 g (09/23/19 1643)   PRN Meds:.HYDROmorphone (DILAUDID) injection, ipratropium-albuterol, oxyCODONE, polyethylene glycol  Antimicrobials: Anti-infectives (From admission, onward)   Start     Dose/Rate Route Frequency Ordered Stop   09/23/19 1400  cefTRIAXone (ROCEPHIN) 1 g in sodium chloride 0.9 % 100 mL IVPB     Discontinue     1 g 200 mL/hr over 30 Minutes Intravenous Every 24 hours 09/23/19 1323     09/22/19 2200  doxycycline (VIBRA-TABS) tablet 100 mg  Status:  Discontinued        100 mg  Oral Every 12 hours 09/22/19 1348 09/23/19 1328   09/20/19 2000  doxycycline (VIBRAMYCIN) 100 mg in sodium chloride 0.9 % 250 mL IVPB  Status:  Discontinued        100 mg 125 mL/hr over 120 Minutes Intravenous Every 12 hours 09/20/19 1944 09/22/19 1348   09/20/19 1950  cefTRIAXone (ROCEPHIN) 1 g in sodium chloride 0.9 % 100 mL IVPB  Status:  Discontinued        1 g 200 mL/hr over 30 Minutes Intravenous Every 24 hours 09/20/19 1943 09/22/19 1347       I have personally reviewed the following labs and images: CBC: Recent Labs  Lab 09/20/19 1306 09/21/19 0421 09/22/19 0323 09/23/19 0342 09/24/19 0410  WBC 12.6* 7.8 8.7 7.8  --   NEUTROABS 10.8*  --   --   --   --   HGB 9.9* 8.1* 8.5* 8.3* 8.4*  HCT 31.1* 25.0* 26.5* 26.4* 27.1*  MCV 97.5 96.2 96.4 97.8  --   PLT 594* 391 410* 391  --    BMP &GFR Recent Labs  Lab 09/20/19 1306 09/20/19 1306 09/20/19 2310 09/21/19 0421 09/22/19 0323 09/23/19 0342 09/24/19 0410  NA 130*   < > 130* 133* 131* 135 135  K >7.5*   < > 4.9 5.7* 5.6* 4.4 4.0  CL 96*   < > 94*  96* 97* 100 99  CO2 18*   < > 23 27 23 26 26   GLUCOSE 87   < > 108* 103* 118* 109* 121*  BUN 108*   < > 55* 58* 64* 31* 23  CREATININE 10.00*   < > 6.02* 6.49* 7.12* 4.50* 3.84*  CALCIUM 8.1*   < > 7.2* 7.4* 6.9* 6.9* 6.6*  MG 2.1  --   --   --   --  1.5* 1.8  PHOS  --   --   --  6.8* 6.5* 3.5 2.5   < > = values in this interval not displayed.   Estimated Creatinine Clearance: 24.2 mL/min (A) (by C-G formula based on SCr of 3.84 mg/dL (H)). Liver & Pancreas: Recent Labs  Lab 09/20/19 1306 09/22/19 0323 09/23/19 0342 09/24/19 0410  AST 48*  --   --   --   ALT 8  --   --   --   ALKPHOS 148*  --   --   --   BILITOT 0.5  --   --   --   PROT 6.3*  --   --   --   ALBUMIN 2.0* 1.4* 1.2* 1.3*   No results for input(s): LIPASE, AMYLASE in the last 168 hours. No results for input(s): AMMONIA in the last 168 hours. Diabetic: No results for input(s): HGBA1C in the last 72 hours. Recent Labs  Lab 09/20/19 1522  GLUCAP 72   Cardiac Enzymes: No results for input(s): CKTOTAL, CKMB, CKMBINDEX, TROPONINI in the last 168 hours. No results for input(s): PROBNP in the last 8760 hours. Coagulation Profile: No results for input(s): INR, PROTIME in the last 168 hours. Thyroid Function Tests: No results for input(s): TSH, T4TOTAL, FREET4, T3FREE, THYROIDAB in the last 72 hours. Lipid Profile: No results for input(s): CHOL, HDL, LDLCALC, TRIG, CHOLHDL, LDLDIRECT in the last 72 hours. Anemia Panel: No results for input(s): VITAMINB12, FOLATE, FERRITIN, TIBC, IRON, RETICCTPCT in the last 72 hours. Urine analysis:    Component Value Date/Time   COLORURINE YELLOW 05/27/2019 [place] 05/27/2019 0524   LABSPEC 1.025 05/27/2019  [place] 6.0 05/27/2019 0524   GLUCOSEU 150 (A) 05/27/2019 0524   HGBUR MODERATE (A) 05/27/2019 0524   BILIRUBINUR NEGATIVE 05/27/2019 0524   KETONESUR NEGATIVE 05/27/2019 0524   PROTEINUR 100 (A) 05/27/2019 0524   UROBILINOGEN 0.2 03/03/2010  1056   NITRITE NEGATIVE 05/27/2019 0524   LEUKOCYTESUR TRACE (A) 05/27/2019 0524   Sepsis Labs: Invalid input(s): PROCALCITONIN, [place]  Microbiology: Recent Results (from the past 240 hour(s))  Blood culture (routine x 2)     Status: None (Preliminary result)   Collection Time: 09/20/19  1:06 PM   Specimen: BLOOD LEFT WRIST  Result Value Ref Range Status   Specimen Description BLOOD LEFT WRIST  Final   Special Requests   Final    BOTTLES DRAWN AEROBIC ONLY Blood Culture adequate volume   Culture   Final    NO GROWTH 4 DAYS Performed at [facility], [address], [place], [place] [address]    Report Status PENDING  Incomplete  Blood culture (routine x 2)     Status: None (Preliminary result)   Collection Time: 09/20/19  1:17 PM   Specimen: Right Antecubital; Blood  Result Value Ref Range Status   Specimen Description RIGHT ANTECUBITAL  Final   Special Requests   Final    BOTTLES DRAWN AEROBIC AND ANAEROBIC Blood Culture adequate volume   Culture   Final    NO GROWTH 4 DAYS Performed at [facility], [address] [place], [place] [address]    Report Status PENDING  Incomplete  SARS Coronavirus 2 by RT PCR (hospital order, performed in [place] hospital lab) Nasopharyngeal Nasopharyngeal Swab     Status: None   Collection Time: 09/20/19  3:37 PM   Specimen: Nasopharyngeal Swab  Result Value Ref Range Status   SARS Coronavirus 2 NEGATIVE NEGATIVE Final    Comment: (NOTE) SARS-CoV-2 target nucleic acids are NOT DETECTED. The SARS-CoV-2 RNA is generally detectable in upper and lower respiratory specimens during the acute phase of infection. The lowest concentration of SARS-CoV-2 viral copies this assay can detect is 250 copies / mL. A negative result does not preclude SARS-CoV-2 infection and should not be used as the sole basis for treatment or other patient management decisions.  A negative result may occur with improper specimen collection / handling,  submission of specimen other than nasopharyngeal swab, presence of viral mutation(s) within the areas targeted by this assay, and inadequate number of viral copies (<250 copies / mL). A negative result must be combined with clinical observations, patient history, and epidemiological information. Fact Sheet for Patients:   StrictlyIdeas.no Fact Sheet for Healthcare Providers: BankingDealers.co.za This test is not yet approved or cleared  by the [place] FDA and has been authorized for detection and/or diagnosis of SARS-CoV-2 by FDA under an Emergency Use Authorization (EUA).  This EUA will remain in effect (meaning this test can be used) for the duration of the COVID-19 declaration under Section 564(b)(1) of the Act, 21 U.S.C. section 360bbb-3(b)(1), unless the authorization is terminated or revoked sooner. Performed at [facility], [address], [place], [place] [address]     Radiology Studies: No results found.   Wilgus Deyton T. [place]  If 7PM-7AM, please contact night-coverage [email or web address] Password [facility] 09/24/2019, 12:17 PM

## 2019-09-24 NOTE — Progress Notes (Signed)
[facility] NEPHROLOGY PROGRESS NOTE  Assessment/ Plan: Pt is a 64 y.o. yo male   with history of A. fib, COPD, CHF, ESRD on HD recent hospitalization for SBO status post ex lap presented with increasing incision pain and missing outpatient dialysis.  # Dehiscent abdomen wall wound: Status post ex lap for SBO.  Currently on antibiotics.  General surgery is following.  # ESRD was on PD and then switched to HD after abdomen surgery.  Apparently, the outpatient dialysis was arranged in recent discharge however patient did not go and missed for HD session.  Now he is receiving dialysis MWF schedule.  He will need confirmation of outpatient HD set up before safe discharge.  I have discussed with the patient and social worker today.  Plan for next HD on 6/14.  He has [facility] for the access.  # Anemia due to recent surgery and CKD: Continue ESA.  No iron because of ongoing infection.  Monitor hemoglobin.  #CKD-MBD: Phosphorus low therefore reduce the dose of calcium acetate.  Monitor phosphorus and calcium level.  # HTN/volume: Monitor blood pressure.  Discussed with the primary team.  Subjective: Seen and examined at bedside.  He had dialysis yesterday with around 2 L UF.  Tolerated well.  He denies nausea vomiting chest pain shortness of breath.  Wants to go home. Objective Vital signs in last 24 hours: Vitals:   09/24/19 0028 09/24/19 0422 09/24/19 0756 09/24/19 0802  BP: 113/72 126/85 137/78   Pulse: 85 89 86   Resp: 13 18 15    Temp: 98.5 F (36.9 C) 98.4 F (36.9 C) 99 F (37.2 C)   TempSrc: Oral Oral Oral   SpO2: 100% 100% 97% 95%  Weight:  90.6 kg    Height:       Weight change: -2.4 kg  Intake/Output Summary (Last 24 hours) at 09/24/2019 1003 Last data filed at 09/23/2019 2000 Gross per 24 hour  Intake 240 ml  Output 2000 ml  Net -1760 ml       Labs: Basic Metabolic Panel: Recent Labs  Lab 09/22/19 0323 09/23/19 0342 09/24/19 0410  NA 131* 135 135  K  5.6* 4.4 4.0  CL 97* 100 99  CO2 23 26 26   GLUCOSE 118* 109* 121*  BUN 64* 31* 23  CREATININE 7.12* 4.50* 3.84*  CALCIUM 6.9* 6.9* 6.6*  PHOS 6.5* 3.5 2.5   Liver Function Tests: Recent Labs  Lab 09/20/19 1306 09/20/19 1306 09/22/19 0323 09/23/19 0342 09/24/19 0410  AST 48*  --   --   --   --   ALT 8  --   --   --   --   ALKPHOS 148*  --   --   --   --   BILITOT 0.5  --   --   --   --   PROT 6.3*  --   --   --   --   ALBUMIN 2.0*   < > 1.4* 1.2* 1.3*   < > = values in this interval not displayed.   No results for input(s): LIPASE, AMYLASE in the last 168 hours. No results for input(s): AMMONIA in the last 168 hours. CBC: Recent Labs  Lab 09/20/19 1306 09/20/19 1306 09/21/19 0421 09/21/19 0421 09/22/19 0323 09/23/19 0342 09/24/19 0410  WBC 12.6*   < > 7.8  --  8.7 7.8  --   NEUTROABS 10.8*  --   --   --   --   --   --  HGB 9.9*   < > 8.1*   < > 8.5* 8.3* 8.4*  HCT 31.1*   < > 25.0*   < > 26.5* 26.4* 27.1*  MCV 97.5  --  96.2  --  96.4 97.8  --   PLT 594*   < > 391  --  410* 391  --    < > = values in this interval not displayed.   Cardiac Enzymes: No results for input(s): CKTOTAL, CKMB, CKMBINDEX, TROPONINI in the last 168 hours. CBG: Recent Labs  Lab 09/20/19 1522  GLUCAP 72    Iron Studies: No results for input(s): IRON, TIBC, TRANSFERRIN, FERRITIN in the last 72 hours. Studies/Results: No results found.  Medications: Infusions: . cefTRIAXone (ROCEPHIN)  IV 1 g (09/23/19 1643)    Scheduled Medications: . apixaban  5 mg Oral BID  . atorvastatin  80 mg Oral Daily  . calcium acetate  2,001 mg Oral TID WC  . Chlorhexidine Gluconate Cloth  6 each Topical Daily  . darbepoetin (ARANESP) injection - DIALYSIS  100 mcg Intravenous Q Wed-HD  . diltiazem  180 mg Oral Daily  . fluticasone furoate-vilanterol  1 puff Inhalation Daily  . gabapentin  200 mg Oral QHS  . metoprolol tartrate  25 mg Oral BID    have reviewed scheduled and prn  medications.  Physical Exam: General:NAD, comfortable Heart:RRR, s1s2 nl Lungs:clear b/l, no crackle Abdomen:soft, abdomen binder present. Extremities:No edema Dialysis Access: Left IJ TDC, site clean  Haniyah Maciolek [name] 09/24/2019,10:03 AM  LOS: 4 days  Pager: [phone]

## 2019-09-24 NOTE — Evaluation (Signed)
Occupational Therapy Evaluation Patient Details Name: Aaron [name] MRN: [id number] DOB: 24-Jun-1955 Today's Date: 09/24/2019    History of Present Illness Patient is a 64 y/o male who presents with SOB, cough and fall. Admitted with hyperkalemia due to 4 missed HD sessions. Also wuth A-fib with RVR, PNA and infected abdominal wound. Recently hospitalized 9/70-2/63 with complications of SBO s/p lap exp 5/17 with repeat 5/24. PMH includes A-fib with RVR, HTN, COPD, ESRD on HD, CHF.   Clinical Impression   This 64 y/o male presents with the above. Pt reports since most recent discharge home he has been mod independent with functional mobility and receiving intermittent assistance for ADL/iADL tasks. Pt currently presenting with pain in LLE and abdomen, decreased mobility status and standing balance impacting his functional performance. Pt requiring up to minA for functional transfers using RW - tolerating transfer OOB to recliner, steps forwards/backward from recliner during session. He currently requires up to maxA for LB ADL, setup/supervision for seated UB ADL. Discussed with pt option of rehab vs home and pt expressing desire for returning home. He will benefit from continued acute OT services and currently recommend follow up [place] services and 24hr supervision/assist (initially) to maximize his overall safety and independence with ADL and mobility. Will follow.     Follow Up Recommendations  Home health OT;Supervision/Assistance - 24 hour;Other (comment) (pt declining SNF)    Equipment Recommendations  None recommended by OT           Precautions / Restrictions Precautions Precautions: Fall Precaution Comments: large abdominal incision Required Braces or Orthoses: Other Brace Other Brace: abdominal binder, L postop shoe for LLE Restrictions Weight Bearing Restrictions: No      Mobility Bed Mobility Overal bed mobility: Needs Assistance Bed Mobility: Supine to Sit Rolling: Min  guard         General bed mobility comments: for safety and lines, HOB slightly elevated   Transfers Overall transfer level: Needs assistance Equipment used: Rolling walker (2 wheeled) Transfers: Sit to/from [facility] Sit to Stand: Min assist;From elevated surface Stand pivot transfers: Min guard       General transfer comment: pt requiring elevated EOB initially and boosting assist to stand to RW; after transfer to recliner pt able to perform additional sit<>Stand from surface height of recliner with light minA     Balance Overall balance assessment: Needs assistance Sitting-balance support: Feet supported Sitting balance-Leahy Scale: Good     Standing balance support: Bilateral upper extremity supported;During functional activity Standing balance-Leahy Scale: Poor Standing balance comment: Reliant on BUE support and RW                           ADL either performed or assessed with clinical judgement   ADL Overall ADL's : Needs assistance/impaired Eating/Feeding: Modified independent;Sitting   Grooming: Set up;Supervision/safety;Sitting   Upper Body Bathing: Set up;Supervision/ safety;Sitting   Lower Body Bathing: Moderate assistance;Sitting/lateral leans;Sit to/from stand   Upper Body Dressing : Set up;Sitting Upper Body Dressing Details (indicate cue type and reason): donning second gown Lower Body Dressing: Moderate assistance;Sitting/lateral leans;Sit to/from stand Lower Body Dressing Details (indicate cue type and reason): assist for post op shoe and to don L sock, minA for sit<>stand Toilet Transfer: Min Insurance claims handler Details (indicate cue type and reason): simulated via transfer to [place] and Hygiene: Minimal assistance;Sitting/lateral lean;Sit to/from stand       Functional mobility during ADLs: Min  guard;Minimal assistance;Rolling walker (assist to stand) General ADL  Comments: pt tolerating OOB to recliner, few steps forward/back from recliner (x2) using RW. pt mostly with limitations due to pain in LLE and abdominal pain/discomfort                   Pertinent Vitals/Pain Pain Assessment: Faces Faces Pain Scale: Hurts little more Pain Location: L foot, abdomen Pain Descriptors / Indicators: Sore;Guarding;Grimacing Pain Intervention(s): Limited activity within patient's tolerance;Monitored during session;Premedicated before session;Repositioned     Hand Dominance Right   Extremity/Trunk Assessment Upper Extremity Assessment Upper Extremity Assessment: Generalized weakness   Lower Extremity Assessment Lower Extremity Assessment: Defer to PT evaluation   Cervical / Trunk Assessment Cervical / Trunk Assessment: Normal   Communication Communication Communication: No difficulties   Cognition Arousal/Alertness: Awake/alert Behavior During Therapy: WFL for tasks assessed/performed Overall Cognitive Status: Within Functional Limits for tasks assessed                                 General Comments: poor awareness of medical condition and need for HD despite being in abdominal pain   General Comments  bruising noted to L heel; abdomen wound bandaged with abdominal binder donned (bandages look clean and intact). Pt on RA however difficult to maintain good pleth for accurate O2 reading, post transfer to recliner SpO2 reading in the 70s (unclear waveform) and pt able to talk to therapist without difficulty, switched O2 probe to earlobe and SpO2 reading in the high 90s-100%     Exercises Exercises: General Lower Extremity General Exercises - Lower Extremity Long Arc Quad: AROM;Both;10 reps;Seated   Shoulder Instructions      Home Living Family/patient expects to be discharged to:: Private residence Living Arrangements: Alone Available Help at Discharge: Family Type of Home: Apartment Home Access: Level entry     Home  Layout: One level     Bathroom Shower/Tub: Teacher, early years/pre: Standard Bathroom Accessibility: Yes   Home Equipment: Cane - single point;Grab bars - tub/shower;Grab bars - toilet;Bedside commode;Shower seat;Wheelchair - [employer] - 4 wheels;Walker - 2 wheels;Walker - standard          Prior Functioning/Environment Level of Independence: Independent with assistive device(s);Needs assistance  Gait / Transfers Assistance Needed: SPC vs RW mostly, reports has a w/c but tries not to depend on it ADL's / Homemaking Assistance Needed: reports ex-wife has been assisting recently with ADL/iADL tasks (since most recent abdominal sx)    Comments: SPC vs RW        OT Problem List: Decreased strength;Decreased range of motion;Decreased activity tolerance;Impaired balance (sitting and/or standing);Decreased knowledge of use of DME or AE;Pain;Cardiopulmonary status limiting activity;Decreased knowledge of precautions      OT Treatment/Interventions: Self-care/ADL training;Therapeutic exercise;Energy conservation;DME and/or AE instruction;Therapeutic activities;Patient/family education;Balance training    OT Goals(Current goals can be found in the care plan section) Acute Rehab OT Goals Patient Stated Goal: "home" OT Goal Formulation: With patient Time For Goal Achievement: 10/08/19 Potential to Achieve Goals: Good  OT Frequency: Min 2X/week   Barriers to D/C:            Co-evaluation              AM-PAC OT "6 Clicks" Daily Activity     Outcome Measure Help from another person eating meals?: None Help from another person taking care of personal grooming?: A Little Help from another person toileting, which  includes using toliet, bedpan, or urinal?: A Lot Help from another person bathing (including washing, rinsing, drying)?: A Lot Help from another person to put on and taking off regular upper body clothing?: A Little Help from another person to put on and  taking off regular lower body clothing?: A Lot 6 Click Score: 16   End of Session Equipment Utilized During Treatment: Gait belt;Rolling walker;Other (comment) (L post op shoe) Nurse Communication: Mobility status  Activity Tolerance: Patient tolerated treatment well Patient left: in chair;with call bell/phone within reach;with chair alarm set  OT Visit Diagnosis: Other abnormalities of gait and mobility (R26.89);Pain Pain - Right/Left: Left Pain - part of body: Ankle and joints of foot (abdomen)                Time: 8916-9450 OT Time Calculation (min): 34 min Charges:  OT General Charges $OT Visit: 1 Visit OT Evaluation $OT Eval Moderate Complexity: 1 Mod OT Treatments $Self Care/Home Management : 8-22 mins  Lou Cal, OT Acute Rehabilitation Services Pager [phone] Office [place] 09/24/2019, 3:01 PM

## 2019-09-25 DIAGNOSIS — W19XXXA Unspecified fall, initial encounter: Secondary | ICD-10-CM

## 2019-09-25 DIAGNOSIS — Y92009 Unspecified place in unspecified non-institutional (private) residence as the place of occurrence of the external cause: Secondary | ICD-10-CM

## 2019-09-25 MED ORDER — CHLORHEXIDINE GLUCONATE CLOTH 2 % EX PADS
6.0000 | MEDICATED_PAD | Freq: Every day | CUTANEOUS | Status: DC
  Administered 2019-09-26 – 2019-09-27 (×2): 6 via TOPICAL

## 2019-09-25 MED ORDER — PANTOPRAZOLE SODIUM 40 MG PO TBEC
40.0000 mg | DELAYED_RELEASE_TABLET | Freq: Every day | ORAL | Status: DC
  Administered 2019-09-25 – 2019-09-27 (×3): 40 mg via ORAL
  Filled 2019-09-25 (×3): qty 1

## 2019-09-25 MED ORDER — DARBEPOETIN ALFA 100 MCG/0.5ML IJ SOSY
100.0000 ug | PREFILLED_SYRINGE | Freq: Once | INTRAMUSCULAR | Status: AC
  Administered 2019-09-25: 100 ug via INTRAVENOUS
  Filled 2019-09-25: qty 0.5

## 2019-09-25 MED ORDER — GUAIFENESIN-DM 100-10 MG/5ML PO SYRP
5.0000 mL | ORAL_SOLUTION | ORAL | Status: DC | PRN
  Administered 2019-09-25 – 2019-09-26 (×5): 5 mL via ORAL
  Filled 2019-09-25 (×5): qty 5

## 2019-09-25 NOTE — Progress Notes (Signed)
PROGRESS NOTE  Aaron [name] VCB:[id number] DOB: April 04, 1956   PCP: Gwenlyn Saran [place]  Patient is from: Home.  DOA: 09/20/2019 LOS: 5  Brief Narrative / Interim history: 64 y.o. male with history of ESRD, A. fib, COPD, diastolic CHF, recent hospitalization for SBO s/p ex lap with retention sutures for laparotomy wound presented to [facility] with acute on chronic difficulty breathing and productive cough. Patient missed 4 HD sessions, because of increasing pain, from his surgical incision in his abdomen.  He also reports abdominal distention.   He also had a fall at home where he landed on his backside.  Denies hitting his head or loss of consciousness.  On admission patient was found to have hyperkalemia to 7.5.  He was also in A. fib with RVR and started on Cardizem drip.  There is also concern about pneumonia for which he was started on antibiotics.  General surgery consulted for laparotomy wound dehiscence and possible infection.  He was transferred to [facility] for hemodialysis.   Patient received back-to-back dialysis on 6/13 and 6/11.  Waiting on outpatient dialysis spot confirmation..  Subjective: Seen and examined earlier this morning.  He states he has not had a good sleep last night due to heartburn.  Heartburn improved with medication.  Reports improvement in his left lower extremity pain, swelling and redness.  No complaints.  Denies chest pain, dyspnea, GI or UTI symptoms.   Objective: Vitals:   09/25/19 0746 09/25/19 0807 09/25/19 0812 09/25/19 1123  BP:  126/74  140/86  Pulse:  73    Resp: 16 18  20   Temp:  98.4 F (36.9 C)  98.7 F (37.1 C)  TempSrc:  Oral  Oral  SpO2:  95% 95% 98%  Weight:      Height:        Intake/Output Summary (Last 24 hours) at 09/25/2019 1254 Last data filed at 09/25/2019 0200 Gross per 24 hour  Intake 440 ml  Output 100 ml  Net 340 ml   Filed Weights   09/23/19 1100 09/24/19 0422 09/25/19 0600  Weight:  88.4 kg 90.6 kg 90 kg    Examination:   GENERAL: No apparent distress.  Nontoxic. HEENT: MMM.  Vision and hearing grossly intact.  NECK: Supple.  No apparent JVD.  RESP:  No IWOB.  Fair aeration bilaterally. CVS:  RRR. Heart sounds normal.  ABD/GI/GU: BS+. Abd soft.  Dressing and abdominal binder. MSK/EXT:  Moves extremities. No apparent deformity. No edema.  SKIN: Dressing and abdominal binder over laparotomy wound.  Mild swelling in left foot but improved.  No erythema.  No increased warmth to touch. NEURO: Sleepy but wakes to voice easily.  Oriented appropriately.  No apparent focal neuro deficit. PSYCH: Calm. Normal affect.   Procedures:  None  Microbiology summarized: COVID-19 PCR negative. Blood cultures NGTD.  Assessment & Plan: Severe hyperkalemia: K7.5 on admit.  Improved. ESRD/fluid overload: Recently transitioned from PD to HD MWF. -Reportedly missed 4 HD sessions due to pain prior to admission -Nephrology managing.  Waiting on outpatient dialysis confirmation   A. fib with RVR-now rate controlled on p.o. metoprolol and Cardizem. -Continue metoprolol 25 mg twice daily and Cardizem CD 180 mg daily -Continue Eliquis   Pneumonia?-Unlikely.  Patient has no fever or leukocytosis.  X-ray findings are nonspecific and could be due to fluid overload in the setting of missed dialysis or atelectasis from shallow breathing due to surgical wound pain.  -On ceftriaxone for left lower extremity cellulitis -  Incentive spirometry  Left foot cellulitis/pain: Improved.  X-ray without acute finding. -Continue IV ceftriaxone. -Scheduled Tylenol with as needed oxycodone for pain -Voltaren gel over left knee  Acute on chronic respiratory failure with hypoxia-reported desaturation to upper 70s and lower 80s requiring 4 L to recover.  Likely due to fluid overload.  Resolved. -Fluid management with hemodialysis. -COPD treatments as below.   SBO status post ex lap x2 with abdominal  wall closure with retention sutures for fascial dehiscence previous hospitalization. -General surgery following, no plan for surgery. Wet-to-dry dressing and abdominal binder   Acute on chronic diastolic CHF-patient has missed multiple hemodialysis treatments since discharge.  Now appears euvolemic after dialysis back-to-back. -Fluid management with dialysis per nephrology.   Chronic COPD-patient with chronic productive cough. -Continue home breathing treatments -Antibiotics as above.  Anemia of renal disease: Hgb stable -Per nephrology.  Hypervolemic hyponatremia in the setting of ESRD and fluid overload: Resolved.  Hyperkalemia: Resolved.  Hypomagnesemia: Resolved. -Monitor and replete as appropriate   Prolonged QTC-resolved on repeat EKG. -Optimize electrolytes -Minimize QT prolonging drugs   Fall at home/debility -fall seems mechanical.  CT head and left foot x-ray without significant finding. -Therapy recommended home health as patient refused SNF.           DVT prophylaxis: On Eliquis for A. fib. Code Status: DNR/DNI Family Communication: Patient and/or RN. Available if any question.  Status is: Inpatient  Remains inpatient appropriate because:outpatient dialysis spot   Dispo: The patient is from: Home              Anticipated d/c is to: Home.               Anticipated d/c date is: 2 days              Patient currently is medically stable to d/c.       Consultants:  Nephrology General surgery   Sch Meds:  Scheduled Meds: . acetaminophen  1,000 mg Oral Q8H  . apixaban  5 mg Oral BID  . atorvastatin  80 mg Oral Daily  . calcium acetate  1,334 mg Oral TID WC  . Chlorhexidine Gluconate Cloth  6 each Topical Daily  . Chlorhexidine Gluconate Cloth  6 each Topical Q0600  . darbepoetin (ARANESP) injection - DIALYSIS  100 mcg Intravenous Q Wed-HD  . diclofenac Sodium  2 g Topical QID  . diltiazem  180 mg Oral Daily  . fluticasone furoate-vilanterol  1  puff Inhalation Daily  . gabapentin  200 mg Oral QHS  . metoprolol tartrate  25 mg Oral BID   Continuous Infusions: . cefTRIAXone (ROCEPHIN)  IV Stopped (09/24/19 1515)   PRN Meds:.HYDROmorphone (DILAUDID) injection, ipratropium-albuterol, ondansetron (ZOFRAN) IV, oxyCODONE, polyethylene glycol  Antimicrobials: Anti-infectives (From admission, onward)   Start     Dose/Rate Route Frequency Ordered Stop   09/23/19 1400  cefTRIAXone (ROCEPHIN) 1 g in sodium chloride 0.9 % 100 mL IVPB     Discontinue     1 g 200 mL/hr over 30 Minutes Intravenous Every 24 hours 09/23/19 1323     09/22/19 2200  doxycycline (VIBRA-TABS) tablet 100 mg  Status:  Discontinued        100 mg Oral Every 12 hours 09/22/19 1348 09/23/19 1328   09/20/19 2000  doxycycline (VIBRAMYCIN) 100 mg in sodium chloride 0.9 % 250 mL IVPB  Status:  Discontinued        100 mg 125 mL/hr over 120 Minutes Intravenous Every 12 hours 09/20/19 1944  09/22/19 1348   09/20/19 1950  cefTRIAXone (ROCEPHIN) 1 g in sodium chloride 0.9 % 100 mL IVPB  Status:  Discontinued        1 g 200 mL/hr over 30 Minutes Intravenous Every 24 hours 09/20/19 1943 09/22/19 1347       I have personally reviewed the following labs and images: CBC: Recent Labs  Lab 09/20/19 1306 09/21/19 0421 09/22/19 0323 09/23/19 0342 09/24/19 0410  WBC 12.6* 7.8 8.7 7.8  --   NEUTROABS 10.8*  --   --   --   --   HGB 9.9* 8.1* 8.5* 8.3* 8.4*  HCT 31.1* 25.0* 26.5* 26.4* 27.1*  MCV 97.5 96.2 96.4 97.8  --   PLT 594* 391 410* 391  --    BMP &GFR Recent Labs  Lab 09/20/19 1306 09/20/19 1306 09/20/19 2310 09/21/19 0421 09/22/19 0323 09/23/19 0342 09/24/19 0410  NA 130*   < > 130* 133* 131* 135 135  K >7.5*   < > 4.9 5.7* 5.6* 4.4 4.0  CL 96*   < > 94* 96* 97* 100 99  CO2 18*   < > 23 27 23 26 26   GLUCOSE 87   < > 108* 103* 118* 109* 121*  BUN 108*   < > 55* 58* 64* 31* 23  CREATININE 10.00*   < > 6.02* 6.49* 7.12* 4.50* 3.84*  CALCIUM 8.1*   < > 7.2*  7.4* 6.9* 6.9* 6.6*  MG 2.1  --   --   --   --  1.5* 1.8  PHOS  --   --   --  6.8* 6.5* 3.5 2.5   < > = values in this interval not displayed.   Estimated Creatinine Clearance: 24.2 mL/min (A) (by C-G formula based on SCr of 3.84 mg/dL (H)). Liver & Pancreas: Recent Labs  Lab 09/20/19 1306 09/22/19 0323 09/23/19 0342 09/24/19 0410  AST 48*  --   --   --   ALT 8  --   --   --   ALKPHOS 148*  --   --   --   BILITOT 0.5  --   --   --   PROT 6.3*  --   --   --   ALBUMIN 2.0* 1.4* 1.2* 1.3*   No results for input(s): LIPASE, AMYLASE in the last 168 hours. No results for input(s): AMMONIA in the last 168 hours. Diabetic: No results for input(s): HGBA1C in the last 72 hours. Recent Labs  Lab 09/20/19 1522  GLUCAP 72   Cardiac Enzymes: No results for input(s): CKTOTAL, CKMB, CKMBINDEX, TROPONINI in the last 168 hours. No results for input(s): PROBNP in the last 8760 hours. Coagulation Profile: No results for input(s): INR, PROTIME in the last 168 hours. Thyroid Function Tests: No results for input(s): TSH, T4TOTAL, FREET4, T3FREE, THYROIDAB in the last 72 hours. Lipid Profile: No results for input(s): CHOL, HDL, LDLCALC, TRIG, CHOLHDL, LDLDIRECT in the last 72 hours. Anemia Panel: No results for input(s): VITAMINB12, FOLATE, FERRITIN, TIBC, IRON, RETICCTPCT in the last 72 hours. Urine analysis:    Component Value Date/Time   COLORURINE YELLOW 05/27/2019 0524   APPEARANCEUR CLEAR 05/27/2019 0524   LABSPEC 1.025 05/27/2019 0524   PHURINE 6.0 05/27/2019 0524   GLUCOSEU 150 (A) 05/27/2019 0524   HGBUR MODERATE (A) 05/27/2019 0524   BILIRUBINUR NEGATIVE 05/27/2019 0524   KETONESUR NEGATIVE 05/27/2019 0524   PROTEINUR 100 (A) 05/27/2019 0524   UROBILINOGEN 0.2 03/03/2010 1056   NITRITE  NEGATIVE 05/27/2019 0524   LEUKOCYTESUR TRACE (A) 05/27/2019 0524   Sepsis Labs: Invalid input(s): PROCALCITONIN, [place]  Microbiology: Recent Results (from the past 240 hour(s))    Blood culture (routine x 2)     Status: None (Preliminary result)   Collection Time: 09/20/19  1:06 PM   Specimen: BLOOD LEFT WRIST  Result Value Ref Range Status   Specimen Description BLOOD LEFT WRIST  Final   Special Requests   Final    BOTTLES DRAWN AEROBIC ONLY Blood Culture adequate volume   Culture   Final    NO GROWTH 4 DAYS Performed at [facility], [address], [place], [place] [address]    Report Status PENDING  Incomplete  Blood culture (routine x 2)     Status: None (Preliminary result)   Collection Time: 09/20/19  1:17 PM   Specimen: Right Antecubital; Blood  Result Value Ref Range Status   Specimen Description RIGHT ANTECUBITAL  Final   Special Requests   Final    BOTTLES DRAWN AEROBIC AND ANAEROBIC Blood Culture adequate volume   Culture   Final    NO GROWTH 4 DAYS Performed at [facility], [address] [place], [place] [address]    Report Status PENDING  Incomplete  SARS Coronavirus 2 by RT PCR (hospital order, performed in [place] hospital lab) Nasopharyngeal Nasopharyngeal Swab     Status: None   Collection Time: 09/20/19  3:37 PM   Specimen: Nasopharyngeal Swab  Result Value Ref Range Status   SARS Coronavirus 2 NEGATIVE NEGATIVE Final    Comment: (NOTE) SARS-CoV-2 target nucleic acids are NOT DETECTED. The SARS-CoV-2 RNA is generally detectable in upper and lower respiratory specimens during the acute phase of infection. The lowest concentration of SARS-CoV-2 viral copies this assay can detect is 250 copies / mL. A negative result does not preclude SARS-CoV-2 infection and should not be used as the sole basis for treatment or other patient management decisions.  A negative result may occur with improper specimen collection / handling, submission of specimen other than nasopharyngeal swab, presence of viral mutation(s) within the areas targeted by this assay, and inadequate number of viral copies (<250 copies / mL). A negative result must be  combined with clinical observations, patient history, and epidemiological information. Fact Sheet for Patients:   StrictlyIdeas.no Fact Sheet for Healthcare Providers: BankingDealers.co.za This test is not yet approved or cleared  by the [place] FDA and has been authorized for detection and/or diagnosis of SARS-CoV-2 by FDA under an Emergency Use Authorization (EUA).  This EUA will remain in effect (meaning this test can be used) for the duration of the COVID-19 declaration under Section 564(b)(1) of the Act, 21 U.S.C. section 360bbb-3(b)(1), unless the authorization is terminated or revoked sooner. Performed at [facility], [address], [place], [place] [address]     Radiology Studies: No results found.   Trashawn Oquendo [place]  If 7PM-7AM, please contact night-coverage [email or web address] Password [facility] 09/25/2019, 12:54 PM

## 2019-09-25 NOTE — Progress Notes (Signed)
[facility] NEPHROLOGY PROGRESS NOTE  Assessment/ Plan: Pt is a 64 y.o. yo male   with history of A. fib, COPD, CHF, ESRD on HD recent hospitalization for SBO status post ex lap presented with increasing incision pain and missing outpatient dialysis.  # Dehiscent abdomen wall wound: Status post ex lap for SBO.  Currently on antibiotics.  General surgery is following.  # ESRD was on PD and then switched to HD after abdomen surgery.  Apparently, the outpatient dialysis was arranged in recent discharge however patient did not go and missed dialysis.   Now he is receiving dialysis MWF schedule.  He will need confirmation of outpatient HD set up before safe discharge.  Renal navigator is following.  Plan for next HD on 6/14.  He has [facility] for the access.  # Anemia due to recent surgery and CKD: Continue ESA.  No iron because of ongoing infection.  Monitor hemoglobin.  #CKD-MBD: Phosphorus low therefore reduce the dose of calcium acetate.  Monitor phosphorus and calcium level.  # HTN/volume: Monitor blood pressure.  Discussed with the primary team.  Subjective: Seen and examined at bedside.  No new event.  Denies nausea vomiting chest pain shortness of breath. Objective Vital signs in last 24 hours: Vitals:   09/25/19 0600 09/25/19 0746 09/25/19 0807 09/25/19 0812  BP: 119/80  126/74   Pulse: 82  73   Resp:  16 18   Temp: 98.6 F (37 C)  98.4 F (36.9 C)   TempSrc: Oral  Oral   SpO2: 95%  95% 95%  Weight: 90 kg     Height:       Weight change: -0.4 kg  Intake/Output Summary (Last 24 hours) at 09/25/2019 1013 Last data filed at 09/25/2019 0200 Gross per 24 hour  Intake 640 ml  Output 100 ml  Net 540 ml       Labs: Basic Metabolic Panel: Recent Labs  Lab 09/22/19 0323 09/23/19 0342 09/24/19 0410  NA 131* 135 135  K 5.6* 4.4 4.0  CL 97* 100 99  CO2 23 26 26   GLUCOSE 118* 109* 121*  BUN 64* 31* 23  CREATININE 7.12* 4.50* 3.84*  CALCIUM 6.9* 6.9* 6.6*   PHOS 6.5* 3.5 2.5   Liver Function Tests: Recent Labs  Lab 09/20/19 1306 09/20/19 1306 09/22/19 0323 09/23/19 0342 09/24/19 0410  AST 48*  --   --   --   --   ALT 8  --   --   --   --   ALKPHOS 148*  --   --   --   --   BILITOT 0.5  --   --   --   --   PROT 6.3*  --   --   --   --   ALBUMIN 2.0*   < > 1.4* 1.2* 1.3*   < > = values in this interval not displayed.   No results for input(s): LIPASE, AMYLASE in the last 168 hours. No results for input(s): AMMONIA in the last 168 hours. CBC: Recent Labs  Lab 09/20/19 1306 09/20/19 1306 09/21/19 0421 09/21/19 0421 09/22/19 0323 09/23/19 0342 09/24/19 0410  WBC 12.6*   < > 7.8  --  8.7 7.8  --   NEUTROABS 10.8*  --   --   --   --   --   --   HGB 9.9*   < > 8.1*   < > 8.5* 8.3* 8.4*  HCT 31.1*   < >  25.0*   < > 26.5* 26.4* 27.1*  MCV 97.5  --  96.2  --  96.4 97.8  --   PLT 594*   < > 391  --  410* 391  --    < > = values in this interval not displayed.   Cardiac Enzymes: No results for input(s): CKTOTAL, CKMB, CKMBINDEX, TROPONINI in the last 168 hours. CBG: Recent Labs  Lab 09/20/19 1522  GLUCAP 72    Iron Studies: No results for input(s): IRON, TIBC, TRANSFERRIN, FERRITIN in the last 72 hours. Studies/Results: No results found.  Medications: Infusions: . cefTRIAXone (ROCEPHIN)  IV Stopped (09/24/19 1515)    Scheduled Medications: . acetaminophen  1,000 mg Oral Q8H  . apixaban  5 mg Oral BID  . atorvastatin  80 mg Oral Daily  . calcium acetate  1,334 mg Oral TID WC  . Chlorhexidine Gluconate Cloth  6 each Topical Daily  . darbepoetin (ARANESP) injection - DIALYSIS  100 mcg Intravenous Q Wed-HD  . diclofenac Sodium  2 g Topical QID  . diltiazem  180 mg Oral Daily  . fluticasone furoate-vilanterol  1 puff Inhalation Daily  . gabapentin  200 mg Oral QHS  . metoprolol tartrate  25 mg Oral BID    have reviewed scheduled and prn medications.  Physical Exam: General:NAD, comfortable, able to lie  flat. Heart:RRR, s1s2 nl Lungs: Clear b/l, no crackle Abdomen:soft, abdomen binder present. Extremities:No edema Dialysis Access: Left IJ TDC, site clean  Deryk Bozman Prasad Ronny Korff 09/25/2019,10:13 AM  LOS: 5 days  Pager: [phone]

## 2019-09-26 LAB — CBC
HCT: 28.4 % — ABNORMAL LOW (ref 39.0–52.0)
Hemoglobin: 8.7 g/dL — ABNORMAL LOW (ref 13.0–17.0)
MCH: 30.5 pg (ref 26.0–34.0)
MCHC: 30.6 g/dL (ref 30.0–36.0)
MCV: 99.6 fL (ref 80.0–100.0)
Platelets: 334 10*3/uL (ref 150–400)
RBC: 2.85 MIL/uL — ABNORMAL LOW (ref 4.22–5.81)
RDW: 16.5 % — ABNORMAL HIGH (ref 11.5–15.5)
WBC: 15.3 10*3/uL — ABNORMAL HIGH (ref 4.0–10.5)
nRBC: 0 % (ref 0.0–0.2)

## 2019-09-26 LAB — CULTURE, BLOOD (ROUTINE X 2)
Culture: NO GROWTH
Culture: NO GROWTH
Special Requests: ADEQUATE
Special Requests: ADEQUATE

## 2019-09-26 LAB — RENAL FUNCTION PANEL
Albumin: 1.5 g/dL — ABNORMAL LOW (ref 3.5–5.0)
Anion gap: 11 (ref 5–15)
BUN: 46 mg/dL — ABNORMAL HIGH (ref 8–23)
CO2: 25 mmol/L (ref 22–32)
Calcium: 7.6 mg/dL — ABNORMAL LOW (ref 8.9–10.3)
Chloride: 96 mmol/L — ABNORMAL LOW (ref 98–111)
Creatinine, Ser: 5.84 mg/dL — ABNORMAL HIGH (ref 0.61–1.24)
GFR calc Af Amer: 11 mL/min — ABNORMAL LOW (ref >60)
GFR calc non Af Amer: 9 mL/min — ABNORMAL LOW (ref >60)
Glucose, Bld: 101 mg/dL — ABNORMAL HIGH (ref 70–99)
Phosphorus: 2.9 mg/dL (ref 2.5–4.6)
Potassium: 5.4 mmol/L — ABNORMAL HIGH (ref 3.5–5.1)
Sodium: 132 mmol/L — ABNORMAL LOW (ref 135–145)

## 2019-09-26 LAB — URIC ACID: Uric Acid, Serum: 4.9 mg/dL (ref 3.7–8.6)

## 2019-09-26 LAB — MAGNESIUM: Magnesium: 1.7 mg/dL (ref 1.7–2.4)

## 2019-09-26 MED ORDER — HEPARIN SODIUM (PORCINE) 1000 UNIT/ML DIALYSIS
1000.0000 [IU] | INTRAMUSCULAR | Status: DC | PRN
  Administered 2019-09-26: 1000 [IU] via INTRAVENOUS_CENTRAL

## 2019-09-26 MED ORDER — SODIUM CHLORIDE 0.9 % IV SOLN: 100.0000 mL | INTRAVENOUS | Status: DC | PRN

## 2019-09-26 MED ORDER — POLYETHYLENE GLYCOL 3350 17 G PO PACK: 17.0000 g | PACK | Freq: Two times a day (BID) | ORAL | Status: DC | PRN

## 2019-09-26 MED ORDER — ALTEPLASE 2 MG IJ SOLR: 2.0000 mg | Freq: Once | INTRAMUSCULAR | Status: DC | PRN

## 2019-09-26 MED ORDER — LIDOCAINE-PRILOCAINE 2.5-2.5 % EX CREA: 1.0000 "application " | TOPICAL_CREAM | CUTANEOUS | Status: DC | PRN

## 2019-09-26 MED ORDER — SENNOSIDES-DOCUSATE SODIUM 8.6-50 MG PO TABS: 1.0000 | ORAL_TABLET | Freq: Two times a day (BID) | ORAL | Status: DC | PRN

## 2019-09-26 MED ORDER — PENTAFLUOROPROP-TETRAFLUOROETH EX AERO: 1.0000 "application " | INHALATION_SPRAY | CUTANEOUS | Status: DC | PRN

## 2019-09-26 MED ORDER — HEPARIN SODIUM (PORCINE) 1000 UNIT/ML IJ SOLN
INTRAMUSCULAR | Status: AC
  Filled 2019-09-26: qty 4

## 2019-09-26 MED ORDER — LIDOCAINE HCL (PF) 1 % IJ SOLN: 5.0000 mL | INTRAMUSCULAR | Status: DC | PRN

## 2019-09-26 NOTE — Progress Notes (Signed)
Pt was gone for HD with two attempts to see him.  Follow up as time and pt allow.   09/26/19 1300  PT Visit Information  Last PT Received On 09/26/19  Reason Eval/Treat Not Completed Patient at procedure or test/unavailable    Mee Hives, PT MS Acute Rehab Dept. Number: [employer]

## 2019-09-26 NOTE — Progress Notes (Addendum)
OT Cancellation Note  Patient Details Name: Aaron [name] MRN: [id number] DOB: [birth year]   Cancelled Treatment:    Reason Eval/Treat Not Completed: Patient at procedure or test/ unavailable (HD); will follow up as able.  Lou Cal, OT Acute Rehabilitation Services Pager [phone] Office [phone]    Raymondo Band 09/26/2019, 9:58 AM

## 2019-09-26 NOTE — Progress Notes (Signed)
[facility] Progress Note     Subjective: Patient has been coughing more and ribs are sore from this. He wants to know if he can take some breaks from abdominal binder. We discussed that with increased coughing I would favor keeping abdominal binder on more but can adjust more if needed.   Objective: Vital signs in last 24 hours: Temp:  [98.1 F (36.7 C)-98.7 F (37.1 C)] 98.1 F (36.7 C) (06/14 0412) Pulse Rate:  [60-81] 61 (06/14 0412) Resp:  [16-21] 21 (06/14 0412) BP: (110-140)/(74-98) 131/89 (06/14 0412) SpO2:  [92 %-100 %] 97 % (06/14 0412) FiO2 (%):  [21 %] 21 % (06/13 0812) Weight:  [91.1 kg] 91.1 kg (06/14 0412) Last BM Date: 09/23/19  Intake/Output from previous day: 06/13 0701 - 06/14 0700 In: 520.1 [P.O.:520; IV Piggyback:0.1] Out: 175 [Urine:175] Intake/Output this shift: No intake/output data recorded.  PE: Gen: Alert, NAD, pleasant Card:RRR Pulm: rate and effort normal Abd: Soft,distended, +BS,tender over incision otherwise abdomen nontender, no peritonitis, open midline wound with less fibrinous tissue, 3 retention sutures in place but the middle and inferior ones pulling through     Lab Results:  Recent Labs    09/24/19 0410 09/26/19 0442  WBC  --  15.3*  HGB 8.4* 8.7*  HCT 27.1* 28.4*  PLT  --  334   BMET Recent Labs    09/24/19 0410 09/26/19 0442  NA 135 132*  K 4.0 5.4*  CL 99 96*  CO2 26 25  GLUCOSE 121* 101*  BUN 23 46*  CREATININE 3.84* 5.84*  CALCIUM 6.6* 7.6*   PT/INR No results for input(s): LABPROT, INR in the last 72 hours. CMP     Component Value Date/Time   NA 132 (L) 09/26/2019 0442   K 5.4 (H) 09/26/2019 0442   CL 96 (L) 09/26/2019 0442   CO2 25 09/26/2019 0442   GLUCOSE 101 (H) 09/26/2019 0442   BUN 46 (H) 09/26/2019 0442   CREATININE 5.84 (H) 09/26/2019 0442   CALCIUM 7.6 (L) 09/26/2019 0442   PROT 6.3 (L) 09/20/2019 1306   ALBUMIN 1.5 (L) 09/26/2019 0442   AST 48 (H) 09/20/2019 1306    ALT 8 09/20/2019 1306   ALKPHOS 148 (H) 09/20/2019 1306   BILITOT 0.5 09/20/2019 1306   GFRNONAA 9 (L) 09/26/2019 0442   GFRAA 11 (L) 09/26/2019 0442   Lipase     Component Value Date/Time   LIPASE 23 08/25/2019 1740       Studies/Results: No results found.  Anti-infectives: Anti-infectives (From admission, onward)   Start     Dose/Rate Route Frequency Ordered Stop   09/23/19 1400  cefTRIAXone (ROCEPHIN) 1 g in sodium chloride 0.9 % 100 mL IVPB     Discontinue     1 g 200 mL/hr over 30 Minutes Intravenous Every 24 hours 09/23/19 1323 09/27/19 2359   09/22/19 2200  doxycycline (VIBRA-TABS) tablet 100 mg  Status:  Discontinued        100 mg Oral Every 12 hours 09/22/19 1348 09/23/19 1328   09/20/19 2000  doxycycline (VIBRAMYCIN) 100 mg in sodium chloride 0.9 % 250 mL IVPB  Status:  Discontinued        100 mg 125 mL/hr over 120 Minutes Intravenous Every 12 hours 09/20/19 1944 09/22/19 1348   09/20/19 1950  cefTRIAXone (ROCEPHIN) 1 g in sodium chloride 0.9 % 100 mL IVPB  Status:  Discontinued        1 g 200 mL/hr over 30 Minutes Intravenous  Every 24 hours 09/20/19 1943 09/22/19 1347       Assessment/Plan CHF COPD A fib- eliquis on hold Polycystic kidney disease, ESRD, on PD - converted to HD  H/O MI - plavix on hold CAD Hep C HLD Pneumonia? Hyperkalemia  SBO -Exploratory laparotomy, LOA, repair of SB serosal injury x3, resection of mesenteric nodule 08/29/19 Dr. Alyce Pagan -Exploratory laparotomy, washout, abdominal wall closure with placement of retention sutures for fascial dehiscence5/24/21 Dr. Raynelle Fanning - Wound as above. Continue BID wet to dry dressing changes. Continue abdominal binder. We will discuss if middle and inferior sutures need to remain in place. We will see again Thursday if patient remains admitted. Follow up info on AVS.  FEN - soft diet VTE - SCDs ID -rocephin  Foley - none  Follow up - Dr. Bobbye Morton  LOS: 6 days    Norm Parcel , [facility] 09/26/2019, 7:29 AM Please see Amion for pager number during day hours 7:00am-4:30pm

## 2019-09-26 NOTE — Progress Notes (Signed)
PROGRESS NOTE  Aaron [name] GLO:[id number] DOB: Sep 16, 1955   PCP: Gwenlyn Saran [place]  Patient is from: Home.  DOA: 09/20/2019 LOS: 6  Brief Narrative / Interim history: 64 y.o. male with history of ESRD, A. fib, COPD, diastolic CHF, recent hospitalization for SBO s/p ex lap with retention sutures for laparotomy wound presented to [facility] with acute on chronic difficulty breathing and productive cough. Patient missed 4 HD sessions, because of increasing pain, from his surgical incision in his abdomen.  He also reports abdominal distention.   He also had a fall at home where he landed on his backside.  Denies hitting his head or loss of consciousness.  On admission patient was found to have hyperkalemia to 7.5.  He was also in A. fib with RVR and started on Cardizem drip.  There is also concern about pneumonia for which he was started on antibiotics.  General surgery consulted for laparotomy wound dehiscence and possible infection.  He was transferred to [facility] for hemodialysis.   Patient received back-to-back dialysis on 6/13 and 6/11.  Waiting on outpatient dialysis spot confirmation..  Subjective: Seen and examined earlier this morning while on dialysis.  No major events overnight or this morning.  Reports improvement in his left foot swelling and pain.  He had some cough last night.  Cough improved with cough medication.  Barely still sore.  Denies chest pain, dyspnea, nausea or vomiting.   Objective: Vitals:   09/26/19 1145 09/26/19 1215 09/26/19 1230 09/26/19 1336  BP: 104/78 (!) 157/89 (!) 147/80 (!) 150/86  Pulse: 90 88 79 97  Resp:   19 15  Temp:   98.9 F (37.2 C) 98.5 F (36.9 C)  TempSrc:   Oral Oral  SpO2:   96% 97%  Weight:   90.4 kg   Height:        Intake/Output Summary (Last 24 hours) at 09/26/2019 1605 Last data filed at 09/26/2019 1230 Gross per 24 hour  Intake 200 ml  Output 3075 ml  Net -2875 ml   Filed Weights    09/26/19 0412 09/26/19 0855 09/26/19 1230  Weight: 91.1 kg 93.6 kg 90.4 kg    Examination:  GENERAL: No apparent distress.  Nontoxic. HEENT: MMM.  Vision and hearing grossly intact.  NECK: Supple.  No apparent JVD.  RESP:  No IWOB.  Fair aeration bilaterally. CVS:  RRR. Heart sounds normal.  ABD/GI/GU: BS+. Abd soft, NTND.  Dressing and abdominal binder MSK/EXT:  Moves extremities. No apparent deformity. No edema.  SKIN: Dressing and abdominal binder over laparotomy wound.  Bruising over the dorsal aspect of left great toe NEURO: Awake, alert and oriented appropriately.  No apparent focal neuro deficit. PSYCH: Calm. Normal affect.   Procedures:  None  Microbiology summarized: COVID-19 PCR negative. Blood cultures NGTD.  Assessment & Plan: Severe hyperkalemia: K7.5 on admit.  Improved. ESRD/fluid overload: Recently transitioned from PD to HD MWF. -Reportedly missed 4 HD sessions due to pain prior to admission -Nephrology managing.  Waiting on outpatient dialysis confirmation   A. fib with RVR-now rate controlled on p.o. metoprolol and Cardizem. -Continue metoprolol 25 mg twice daily and Cardizem CD 180 mg daily -Continue Eliquis   Pneumonia?-Unlikely.  Patient has no fever or leukocytosis.  X-ray findings are nonspecific and could be due to fluid overload in the setting of missed dialysis or atelectasis from shallow breathing due to surgical wound pain.  -On ceftriaxone for left lower extremity cellulitis through 06/15 -Incentive spirometry  Left foot cellulitis/pain: Improved.  X-ray without acute finding. -Continue IV ceftriaxone as above. -Scheduled Tylenol with as needed oxycodone for pain -Voltaren gel over left knee  Acute on chronic respiratory failure with hypoxia-reported desaturation to upper 70s and lower 80s requiring 4 L to recover.  Likely due to fluid overload.  Resolved. -Fluid management with hemodialysis. -COPD treatments as below.   SBO status post ex  lap x2 with abdominal wall closure with retention sutures for fascial dehiscence previous hospitalization. -General surgery following, no plan for surgery. Wet-to-dry dressing and abdominal binder -General surgery to arrange outpatient follow-up.   Acute on chronic diastolic CHF-patient has missed multiple hemodialysis treatments since discharge.  Now appears euvolemic after dialysis back-to-back. -Fluid management with dialysis per nephrology.   Chronic COPD-patient with chronic productive cough. -Continue home breathing treatments -Antibiotics as above.  Anemia of renal disease: Hgb stable -Per nephrology.  Hypervolemic hyponatremia in the setting of ESRD and fluid overload: Resolved.  Hyperkalemia:  -Per nephrology  Hypomagnesemia: Resolved. -Monitor and replete as appropriate   Prolonged QTC-resolved on repeat EKG. -Optimize electrolytes -Minimize QT prolonging drugs   Fall at home/debility -fall seems mechanical.  CT head and left foot x-ray without significant finding. -Therapy recommended home health as patient refused SNF.  GERD -P.o. Protonix         DVT prophylaxis: On Eliquis for A. fib. Code Status: DNR/DNI Family Communication: Patient and/or RN. Available if any question.  Status is: Inpatient  Remains inpatient appropriate because:outpatient dialysis spot   Dispo: The patient is from: Home              Anticipated d/c is to: Home.               Anticipated d/c date is: 1 day              Patient currently is medically stable to d/c.       Consultants:  Nephrology General surgery   Sch Meds:  Scheduled Meds: . acetaminophen  1,000 mg Oral Q8H  . apixaban  5 mg Oral BID  . atorvastatin  80 mg Oral Daily  . calcium acetate  1,334 mg Oral TID WC  . Chlorhexidine Gluconate Cloth  6 each Topical Daily  . Chlorhexidine Gluconate Cloth  6 each Topical Q0600  . darbepoetin (ARANESP) injection - DIALYSIS  100 mcg Intravenous Q Wed-HD  .  diclofenac Sodium  2 g Topical QID  . diltiazem  180 mg Oral Daily  . fluticasone furoate-vilanterol  1 puff Inhalation Daily  . gabapentin  200 mg Oral QHS  . heparin sodium (porcine)      . metoprolol tartrate  25 mg Oral BID  . pantoprazole  40 mg Oral Daily   Continuous Infusions: . cefTRIAXone (ROCEPHIN)  IV Stopped (09/25/19 1334)   PRN Meds:.guaiFENesin-dextromethorphan, HYDROmorphone (DILAUDID) injection, ipratropium-albuterol, ondansetron (ZOFRAN) IV, oxyCODONE, polyethylene glycol  Antimicrobials: Anti-infectives (From admission, onward)   Start     Dose/Rate Route Frequency Ordered Stop   09/23/19 1400  cefTRIAXone (ROCEPHIN) 1 g in sodium chloride 0.9 % 100 mL IVPB     Discontinue     1 g 200 mL/hr over 30 Minutes Intravenous Every 24 hours 09/23/19 1323 09/27/19 2359   09/22/19 2200  doxycycline (VIBRA-TABS) tablet 100 mg  Status:  Discontinued        100 mg Oral Every 12 hours 09/22/19 1348 09/23/19 1328   09/20/19 2000  doxycycline (VIBRAMYCIN) 100 mg in sodium chloride 0.9 %  250 mL IVPB  Status:  Discontinued        100 mg 125 mL/hr over 120 Minutes Intravenous Every 12 hours 09/20/19 1944 09/22/19 1348   09/20/19 1950  cefTRIAXone (ROCEPHIN) 1 g in sodium chloride 0.9 % 100 mL IVPB  Status:  Discontinued        1 g 200 mL/hr over 30 Minutes Intravenous Every 24 hours 09/20/19 1943 09/22/19 1347       I have personally reviewed the following labs and images: CBC: Recent Labs  Lab 09/20/19 1306 09/20/19 1306 09/21/19 0421 09/22/19 0323 09/23/19 0342 09/24/19 0410 09/26/19 0442  WBC 12.6*  --  7.8 8.7 7.8  --  15.3*  NEUTROABS 10.8*  --   --   --   --   --   --   HGB 9.9*   < > 8.1* 8.5* 8.3* 8.4* 8.7*  HCT 31.1*   < > 25.0* 26.5* 26.4* 27.1* 28.4*  MCV 97.5  --  96.2 96.4 97.8  --  99.6  PLT 594*  --  391 410* 391  --  334   < > = values in this interval not displayed.   BMP &GFR Recent Labs  Lab 09/20/19 1306 09/20/19 2310 09/21/19 0421  09/22/19 0323 09/23/19 0342 09/24/19 0410 09/26/19 0442  NA 130*   < > 133* 131* 135 135 132*  K >7.5*   < > 5.7* 5.6* 4.4 4.0 5.4*  CL 96*   < > 96* 97* 100 99 96*  CO2 18*   < > 27 23 26 26 25   GLUCOSE 87   < > 103* 118* 109* 121* 101*  BUN 108*   < > 58* 64* 31* 23 46*  CREATININE 10.00*   < > 6.49* 7.12* 4.50* 3.84* 5.84*  CALCIUM 8.1*   < > 7.4* 6.9* 6.9* 6.6* 7.6*  MG 2.1  --   --   --  1.5* 1.8 1.7  PHOS  --   --  6.8* 6.5* 3.5 2.5 2.9   < > = values in this interval not displayed.   Estimated Creatinine Clearance: 15.9 mL/min (A) (by C-G formula based on SCr of 5.84 mg/dL (H)). Liver & Pancreas: Recent Labs  Lab 09/20/19 1306 09/22/19 0323 09/23/19 0342 09/24/19 0410 09/26/19 0442  AST 48*  --   --   --   --   ALT 8  --   --   --   --   ALKPHOS 148*  --   --   --   --   BILITOT 0.5  --   --   --   --   PROT 6.3*  --   --   --   --   ALBUMIN 2.0* 1.4* 1.2* 1.3* 1.5*   No results for input(s): LIPASE, AMYLASE in the last 168 hours. No results for input(s): AMMONIA in the last 168 hours. Diabetic: No results for input(s): HGBA1C in the last 72 hours. Recent Labs  Lab 09/20/19 1522  GLUCAP 72   Cardiac Enzymes: No results for input(s): CKTOTAL, CKMB, CKMBINDEX, TROPONINI in the last 168 hours. No results for input(s): PROBNP in the last 8760 hours. Coagulation Profile: No results for input(s): INR, PROTIME in the last 168 hours. Thyroid Function Tests: No results for input(s): TSH, T4TOTAL, FREET4, T3FREE, THYROIDAB in the last 72 hours. Lipid Profile: No results for input(s): CHOL, HDL, LDLCALC, TRIG, CHOLHDL, LDLDIRECT in the last 72 hours. Anemia Panel: No results for input(s): VITAMINB12,  FOLATE, FERRITIN, TIBC, IRON, RETICCTPCT in the last 72 hours. Urine analysis:    Component Value Date/Time   COLORURINE YELLOW 05/27/2019 0524   APPEARANCEUR CLEAR 05/27/2019 0524   LABSPEC 1.025 05/27/2019 0524   PHURINE 6.0 05/27/2019 0524   GLUCOSEU 150 (A)  05/27/2019 0524   HGBUR MODERATE (A) 05/27/2019 0524   BILIRUBINUR NEGATIVE 05/27/2019 0524   KETONESUR NEGATIVE 05/27/2019 0524   PROTEINUR 100 (A) 05/27/2019 0524   UROBILINOGEN 0.2 03/03/2010 1056   NITRITE NEGATIVE 05/27/2019 0524   LEUKOCYTESUR TRACE (A) 05/27/2019 0524   Sepsis Labs: Invalid input(s): PROCALCITONIN, [place]  Microbiology: Recent Results (from the past 240 hour(s))  Blood culture (routine x 2)     Status: None   Collection Time: 09/20/19  1:06 PM   Specimen: BLOOD LEFT WRIST  Result Value Ref Range Status   Specimen Description BLOOD LEFT WRIST  Final   Special Requests   Final    BOTTLES DRAWN AEROBIC ONLY Blood Culture adequate volume   Culture   Final    NO GROWTH 6 DAYS Performed at [facility], [address], [place], [place] [address]    Report Status 09/26/2019 FINAL  Final  Blood culture (routine x 2)     Status: None   Collection Time: 09/20/19  1:17 PM   Specimen: Right Antecubital; Blood  Result Value Ref Range Status   Specimen Description RIGHT ANTECUBITAL  Final   Special Requests   Final    BOTTLES DRAWN AEROBIC AND ANAEROBIC Blood Culture adequate volume   Culture   Final    NO GROWTH 6 DAYS Performed at [facility], [address] [place], [place] [address]    Report Status 09/26/2019 FINAL  Final  SARS Coronavirus 2 by RT PCR (hospital order, performed in [facility] hospital lab) Nasopharyngeal Nasopharyngeal Swab     Status: None   Collection Time: 09/20/19  3:37 PM   Specimen: Nasopharyngeal Swab  Result Value Ref Range Status   SARS Coronavirus 2 NEGATIVE NEGATIVE Final    Comment: (NOTE) SARS-CoV-2 target nucleic acids are NOT DETECTED. The SARS-CoV-2 RNA is generally detectable in upper and lower respiratory specimens during the acute phase of infection. The lowest concentration of SARS-CoV-2 viral copies this assay can detect is 250 copies / mL. A negative result does not preclude SARS-CoV-2 infection and should not  be used as the sole basis for treatment or other patient management decisions.  A negative result may occur with improper specimen collection / handling, submission of specimen other than nasopharyngeal swab, presence of viral mutation(s) within the areas targeted by this assay, and inadequate number of viral copies (<250 copies / mL). A negative result must be combined with clinical observations, patient history, and epidemiological information. Fact Sheet for Patients:   StrictlyIdeas.no Fact Sheet for Healthcare Providers: BankingDealers.co.za This test is not yet approved or cleared  by the [place] FDA and has been authorized for detection and/or diagnosis of SARS-CoV-2 by FDA under an Emergency Use Authorization (EUA).  This EUA will remain in effect (meaning this test can be used) for the duration of the COVID-19 declaration under Section 564(b)(1) of the Act, 21 U.S.C. section 360bbb-3(b)(1), unless the authorization is terminated or revoked sooner. Performed at [facility], [address], [place], [place] [address]     Radiology Studies: No results found.   Deatra Mcmahen T. Williams  If 7PM-7AM, please contact night-coverage [email or web address] Password [facility] 09/26/2019, 4:05 PM

## 2019-09-26 NOTE — Progress Notes (Signed)
[facility] ROUNDING NOTE   Subjective:   This is a 64 year old gentleman with a history of atrial fibrillation COPD congestive heart failure and end-stage renal disease on hemodialysis.  He had a recent hospitalization for small bowel obstruction status post exploratory laparotomy with retention sutures for laparotomy wound presented to [facility] with shortness of breath having missed 4 dialysis sessions.  His dialysis days are Monday Wednesday Friday he is scheduled for dialysis 09/26/2019.  He was transferred to [facility] for surgical consultation.  I appreciate the help and assistance of [place] surgery.  Blood pressure 149/98 pulse 92 temperature 98.4 O2 sats 95% room air  Sodium 132 potassium three 5.4 chloride 96 CO2 25 BUN 46 creatinine 5.84 glucose 101 calcium 7.6 phosphorus 2.9 albumin 1.5 hemoglobin 8.7 WBC 15.3  Eliquis 5 mg twice daily atorvastatin 80 mg daily PhosLo 1.3 g 3 times daily with meals, Cardizem 180 mg daily, Neurontin 200 mg nightly Lopressor 25 mg twice daily.  IV Rocephin 1 g every 24 hours    Objective:  Vital signs in last 24 hours:  Temp:  [98.1 F (36.7 C)-98.7 F (37.1 C)] 98.4 F (36.9 C) (06/14 0759) Pulse Rate:  [60-97] 97 (06/14 0759) Resp:  [19-21] 19 (06/14 0759) BP: (110-149)/(86-98) 149/98 (06/14 0759) SpO2:  [92 %-100 %] 95 % (06/14 0822) Weight:  [91.1 kg] 91.1 kg (06/14 0412)  Weight change: 1.1 kg Filed Weights   09/24/19 0422 09/25/19 0600 09/26/19 0412  Weight: 90.6 kg 90 kg 91.1 kg    Intake/Output: I/O last 3 completed shifts: In: 960.1 [P.O.:760; IV Piggyback:200.1] Out: 275 [Urine:275]   Intake/Output this shift:  No intake/output data recorded.  General:NAD, comfortable, able to lie flat. Heart:RRR, s1s2 nl Lungs: Clear b/l, no crackle Abdomen:soft, abdomen binder present. Extremities:No edema Dialysis Access: Left IJ TDC, site clean   Basic Metabolic Panel: Recent Labs   Lab 09/20/19 1306 09/20/19 2310 09/21/19 0421 09/21/19 0421 09/22/19 0323 09/22/19 0323 09/23/19 0342 09/24/19 0410 09/26/19 0442  NA 130*   < > 133*  --  131*  --  135 135 132*  K >7.5*   < > 5.7*  --  5.6*  --  4.4 4.0 5.4*  CL 96*   < > 96*  --  97*  --  100 99 96*  CO2 18*   < > 27  --  23  --  26 26 25   GLUCOSE 87   < > 103*  --  118*  --  109* 121* 101*  BUN 108*   < > 58*  --  64*  --  31* 23 46*  CREATININE 10.00*   < > 6.49*  --  7.12*  --  4.50* 3.84* 5.84*  CALCIUM 8.1*   < > 7.4*   < > 6.9*   < > 6.9* 6.6* 7.6*  MG 2.1  --   --   --   --   --  1.5* 1.8 1.7  PHOS  --   --  6.8*  --  6.5*  --  3.5 2.5 2.9   < > = values in this interval not displayed.    Liver Function Tests: Recent Labs  Lab 09/20/19 1306 09/22/19 0323 09/23/19 0342 09/24/19 0410 09/26/19 0442  AST 48*  --   --   --   --   ALT 8  --   --   --   --   ALKPHOS 148*  --   --   --   --  BILITOT 0.5  --   --   --   --   PROT 6.3*  --   --   --   --   ALBUMIN 2.0* 1.4* 1.2* 1.3* 1.5*   No results for input(s): LIPASE, AMYLASE in the last 168 hours. No results for input(s): AMMONIA in the last 168 hours.  CBC: Recent Labs  Lab 09/20/19 1306 09/20/19 1306 09/21/19 0421 09/22/19 0323 09/23/19 0342 09/24/19 0410 09/26/19 0442  WBC 12.6*  --  7.8 8.7 7.8  --  15.3*  NEUTROABS 10.8*  --   --   --   --   --   --   HGB 9.9*   < > 8.1* 8.5* 8.3* 8.4* 8.7*  HCT 31.1*   < > 25.0* 26.5* 26.4* 27.1* 28.4*  MCV 97.5  --  96.2 96.4 97.8  --  99.6  PLT 594*  --  391 410* 391  --  334   < > = values in this interval not displayed.    Cardiac Enzymes: No results for input(s): CKTOTAL, CKMB, CKMBINDEX, TROPONINI in the last 168 hours.  BNP: Invalid input(s): POCBNP  CBG: Recent Labs  Lab 09/20/19 1522  GLUCAP 72    Microbiology: Results for orders placed or performed during the hospital encounter of 09/20/19  Blood culture (routine x 2)     Status: None   Collection Time: 09/20/19  1:06  PM   Specimen: BLOOD LEFT WRIST  Result Value Ref Range Status   Specimen Description BLOOD LEFT WRIST  Final   Special Requests   Final    BOTTLES DRAWN AEROBIC ONLY Blood Culture adequate volume   Culture   Final    NO GROWTH 6 DAYS Performed at [facility], [address], [place], [place] [address]    Report Status 09/26/2019 FINAL  Final  Blood culture (routine x 2)     Status: None   Collection Time: 09/20/19  1:17 PM   Specimen: Right Antecubital; Blood  Result Value Ref Range Status   Specimen Description RIGHT ANTECUBITAL  Final   Special Requests   Final    BOTTLES DRAWN AEROBIC AND ANAEROBIC Blood Culture adequate volume   Culture   Final    NO GROWTH 6 DAYS Performed at [facility], [address], [place], [place] [address]    Report Status 09/26/2019 FINAL  Final  SARS Coronavirus 2 by RT PCR (hospital order, performed in [facility] hospital lab) Nasopharyngeal Nasopharyngeal Swab     Status: None   Collection Time: 09/20/19  3:37 PM   Specimen: Nasopharyngeal Swab  Result Value Ref Range Status   SARS Coronavirus 2 NEGATIVE NEGATIVE Final    Comment: (NOTE) SARS-CoV-2 target nucleic acids are NOT DETECTED. The SARS-CoV-2 RNA is generally detectable in upper and lower respiratory specimens during the acute phase of infection. The lowest concentration of SARS-CoV-2 viral copies this assay can detect is 250 copies / mL. A negative result does not preclude SARS-CoV-2 infection and should not be used as the sole basis for treatment or other patient management decisions.  A negative result may occur with improper specimen collection / handling, submission of specimen other than nasopharyngeal swab, presence of viral mutation(s) within the areas targeted by this assay, and inadequate number of viral copies (<250 copies / mL). A negative result must be combined with clinical observations, patient history, and epidemiological information. Fact Sheet for Patients:    StrictlyIdeas.no Fact Sheet for Healthcare Providers: BankingDealers.co.za This test  is not yet approved or cleared  by the [place] and has been authorized for detection and/or diagnosis of SARS-CoV-2 by FDA under an Emergency Use Authorization (EUA).  This EUA will remain in effect (meaning this test can be used) for the duration of the COVID-19 declaration under Section 564(b)(1) of the Act, 21 U.S.C. section 360bbb-3(b)(1), unless the authorization is terminated or revoked sooner. Performed at [facility], [address] [place], [place] [address]     Coagulation Studies: No results for input(s): LABPROT, INR in the last 72 hours.  Urinalysis: No results for input(s): COLORURINE, LABSPEC, PHURINE, GLUCOSEU, HGBUR, BILIRUBINUR, KETONESUR, PROTEINUR, UROBILINOGEN, NITRITE, LEUKOCYTESUR in the last 72 hours.  Invalid input(s): APPERANCEUR    Imaging: No results found.   Medications:   . sodium chloride    . sodium chloride    . cefTRIAXone (ROCEPHIN)  IV Stopped (09/25/19 1334)   . acetaminophen  1,000 mg Oral Q8H  . apixaban  5 mg Oral BID  . atorvastatin  80 mg Oral Daily  . calcium acetate  1,334 mg Oral TID WC  . Chlorhexidine Gluconate Cloth  6 each Topical Daily  . Chlorhexidine Gluconate Cloth  6 each Topical Q0600  . darbepoetin (ARANESP) injection - DIALYSIS  100 mcg Intravenous Q Wed-HD  . diclofenac Sodium  2 g Topical QID  . diltiazem  180 mg Oral Daily  . fluticasone furoate-vilanterol  1 puff Inhalation Daily  . gabapentin  200 mg Oral QHS  . metoprolol tartrate  25 mg Oral BID  . pantoprazole  40 mg Oral Daily   sodium chloride, sodium chloride, alteplase, guaiFENesin-dextromethorphan, heparin, HYDROmorphone (DILAUDID) injection, ipratropium-albuterol, lidocaine (PF), lidocaine-prilocaine, ondansetron (ZOFRAN) IV, oxyCODONE, pentafluoroprop-tetrafluoroeth, polyethylene glycol  Assessment/ Plan:    1. Dehiscent abdomen wall wound: Status post ex lap for SBO.  Currently on antibiotics.  General surgery is following.  2.  ESRD outpatient dialysis [place].  Monday Wednesday Friday schedule dialysis scheduled for 09/26/2019  3. Anemia due to recent surgery and CKD: Continue ESA.  No iron because of ongoing infection.  Monitor hemoglobin.  4. CKD-MBD: Phosphorus low therefore reduce the dose of calcium acetate.  Monitor phosphorus and calcium level.  5.  HTN/volume: Monitor blood pressure.    LOS: [place] @TODAY @8 :58 AM

## 2019-09-26 NOTE — Progress Notes (Signed)
Plan of care reviewed. Pt 's been progressing. Appeared alert and oriented x 4, pleasant and talkative. He's hemodynamically stable. Remained afebrile, Atrial fib rat controlled on monitor, HR 70s-80s, BP 110/86- 127/98 mmHg. On room air, spo2 95-100%, RR 19-20.   Dehiscent abdominal wound presented with soaking wet with serosanguinous drainage.Dressing changed with wet to dry Kirlex packed and covered with ABD pad. Pain tolerated well with Oxycodone and Dilaudid given PRN.   No immediate distress noted tonight. We will continue to monitor.  Kennyth Lose, RN

## 2019-09-26 NOTE — Progress Notes (Signed)
Removed 3 retention sutures, part of PD catheter visible through skin superiorly. Patient tolerated well.   Norm Parcel , [facility] 09/26/2019, 4:06 PM Please see Amion for pager number during day hours 7:00am-4:30pm

## 2019-09-26 NOTE — Progress Notes (Signed)
Occupational Therapy Treatment Patient Details Name: Aaron [name] MRN: [id number] DOB: 14-Jan-1956 Today's Date: 09/26/2019    History of present illness Patient is a 65 y/o male who presents with SOB, cough and fall. Admitted with hyperkalemia due to 4 missed HD sessions. Also wuth A-fib with RVR, PNA and infected abdominal wound. Recently hospitalized 6/16-0/73 with complications of SBO s/p lap exp 5/17 with repeat 5/24. PMH includes A-fib with RVR, HTN, COPD, ESRD on HD, CHF.   OT comments  Pt presents supine in bed agreeable to OT session. Pt progressing towards OT goals, tolerating increased distance with functional mobility (to bathroom and back) using RW with overall minA. Pt completing toileting with minA, he continues to require increased assist for LB ADL given pain/abdominal wound. Pt fatigued with room level activity requiring seated rest at EOB prior to return to supine. Continue to recommend follow up [facility] services after discharge to maximize his overall endurance, safety and independence with ADL and mobility. Acute OT to follow.    Follow Up Recommendations  Home health OT;Supervision/Assistance - 24 hour;Other (comment) (pt declining SNF)    Equipment Recommendations  None recommended by OT          Precautions / Restrictions Precautions Precautions: Fall Precaution Comments: large abdominal incision Required Braces or Orthoses: Other Brace Other Brace: abdominal binder, L postop shoe for LLE       Mobility Bed Mobility Overal bed mobility: Needs Assistance Bed Mobility: Supine to Sit;Sit to Supine Rolling: Min guard   Supine to sit: Min guard;HOB elevated     General bed mobility comments: HOB elevated with transitions   Transfers Overall transfer level: Needs assistance Equipment used: Rolling walker (2 wheeled) Transfers: Sit to/from Stand Sit to Stand: Min assist         General transfer comment: assist from elevated EOB, min boosting assist from  regular toilet height via grab bar , increased time/effort     Balance Overall balance assessment: Needs assistance Sitting-balance support: Feet supported Sitting balance-Leahy Scale: Good     Standing balance support: Bilateral upper extremity supported;During functional activity Standing balance-Leahy Scale: Poor Standing balance comment: Reliant on BUE support and RW                           ADL either performed or assessed with clinical judgement   ADL Overall ADL's : Needs assistance/impaired     Grooming: Set up;Sitting;Wash/dry hands Grooming Details (indicate cue type and reason): not able to tolerate completing in standing today             Lower Body Dressing: Moderate assistance;Sitting/lateral leans;Sit to/from stand Lower Body Dressing Details (indicate cue type and reason): assist to don/doff L post op shoe Toilet Transfer: Minimal assistance;Ambulation;RW;Grab bars;Regular Museum/gallery exhibitions officer and Hygiene: Minimal assistance;Sitting/lateral lean;Sit to/from stand Toileting - Clothing Manipulation Details (indicate cue type and reason): minA for clothing management (underwear), pt mostly able to manage on his own with assist for balance , performing posterior pericare after BM     Functional mobility during ADLs: Minimal assistance;Rolling walker                         Cognition Arousal/Alertness: Awake/alert Behavior During Therapy: WFL for tasks assessed/performed Overall Cognitive Status: Within Functional Limits for tasks assessed  General Comments: for basic tasks today        Exercises     Shoulder Instructions       General Comments pt reports feeling increased DOE after return to EOB (and after having BM, requiring increased time for BM to ensure he doesn't strain too much given abdominal wound), SpO2 97% on RA, pt requesting use of supplemental O2 for a  brief time to "help him feel better" - applied 1.5L O2 with RN made aware     Pertinent Vitals/ Pain       Faces Pain Scale: Hurts little more Pain Location: L foot, abdomen Pain Descriptors / Indicators: Sore;Guarding;Grimacing  Home Living                                          Prior Functioning/Environment              Frequency  Min 2X/week        Progress Toward Goals  OT Goals(current goals can now be found in the care plan section)  Progress towards OT goals: Progressing toward goals  Acute Rehab OT Goals Patient Stated Goal: "home" OT Goal Formulation: With patient Time For Goal Achievement: 10/08/19 Potential to Achieve Goals: Good ADL Goals Pt Will Perform Grooming: with modified independence;sitting;standing Pt Will Perform Lower Body Dressing: with supervision;sit to/from stand;sitting/lateral leans;with adaptive equipment Pt Will Transfer to Toilet: with modified independence;ambulating;stand pivot transfer Pt Will Perform Toileting - Clothing Manipulation and hygiene: with modified independence;sit to/from stand;sitting/lateral leans Pt/caregiver will Perform Home Exercise Program: Increased strength;Both right and left upper extremity;With written HEP provided;With theraband  Plan Discharge plan remains appropriate    Co-evaluation                 AM-PAC OT "6 Clicks" Daily Activity     Outcome Measure   Help from another person eating meals?: None Help from another person taking care of personal grooming?: A Little Help from another person toileting, which includes using toliet, bedpan, or urinal?: A Lot Help from another person bathing (including washing, rinsing, drying)?: A Lot Help from another person to put on and taking off regular upper body clothing?: A Little Help from another person to put on and taking off regular lower body clothing?: A Lot 6 Click Score: 16    End of Session Equipment Utilized During  Treatment: Gait belt;Rolling walker;Other (comment) (L post op shoe)  OT Visit Diagnosis: Other abnormalities of gait and mobility (R26.89);Pain Pain - Right/Left: Left Pain - part of body: Ankle and joints of foot (abdomen)   Activity Tolerance Patient tolerated treatment well   Patient Left with call bell/phone within reach;in bed;with bed alarm set;with nursing/sitter in room   Nurse Communication Mobility status        Time: 1626 (-approx 12 min seated on toilet )-1659 OT Time Calculation (min): 33 min  Charges: OT General Charges $OT Visit: 1 Visit OT Treatments $Self Care/Home Management : 8-22 mins  Lou Cal, [place] Pager [phone] Office [place] 09/26/2019, 5:35 PM

## 2019-09-27 LAB — RENAL FUNCTION PANEL
Albumin: 1.4 g/dL — ABNORMAL LOW (ref 3.5–5.0)
Anion gap: 12 (ref 5–15)
BUN: 29 mg/dL — ABNORMAL HIGH (ref 8–23)
CO2: 26 mmol/L (ref 22–32)
Calcium: 7.5 mg/dL — ABNORMAL LOW (ref 8.9–10.3)
Chloride: 98 mmol/L (ref 98–111)
Creatinine, Ser: 4.32 mg/dL — ABNORMAL HIGH (ref 0.61–1.24)
GFR calc Af Amer: 16 mL/min — ABNORMAL LOW (ref >60)
GFR calc non Af Amer: 14 mL/min — ABNORMAL LOW (ref >60)
Glucose, Bld: 137 mg/dL — ABNORMAL HIGH (ref 70–99)
Phosphorus: 2.6 mg/dL (ref 2.5–4.6)
Potassium: 5 mmol/L (ref 3.5–5.1)
Sodium: 136 mmol/L (ref 135–145)

## 2019-09-27 LAB — HEMOGLOBIN AND HEMATOCRIT, BLOOD
HCT: 26.9 % — ABNORMAL LOW (ref 39.0–52.0)
Hemoglobin: 8.4 g/dL — ABNORMAL LOW (ref 13.0–17.0)

## 2019-09-27 LAB — CBC
HCT: 26 % — ABNORMAL LOW (ref 39.0–52.0)
Hemoglobin: 8.1 g/dL — ABNORMAL LOW (ref 13.0–17.0)
MCH: 30.8 pg (ref 26.0–34.0)
MCHC: 31.2 g/dL (ref 30.0–36.0)
MCV: 98.9 fL (ref 80.0–100.0)
Platelets: 306 10*3/uL (ref 150–400)
RBC: 2.63 MIL/uL — ABNORMAL LOW (ref 4.22–5.81)
RDW: 16.5 % — ABNORMAL HIGH (ref 11.5–15.5)
WBC: 10.9 10*3/uL — ABNORMAL HIGH (ref 4.0–10.5)
nRBC: 0 % (ref 0.0–0.2)

## 2019-09-27 LAB — MAGNESIUM: Magnesium: 1.6 mg/dL — ABNORMAL LOW (ref 1.7–2.4)

## 2019-09-27 MED ORDER — GABAPENTIN 300 MG PO CAPS: 300.0000 mg | ORAL_CAPSULE | Freq: Every day | ORAL | 1 refills | Status: AC

## 2019-09-27 MED ORDER — DILTIAZEM HCL ER COATED BEADS 180 MG PO CP24: 180.0000 mg | ORAL_CAPSULE | Freq: Every day | ORAL | 1 refills | Status: AC

## 2019-09-27 MED ORDER — CHLORHEXIDINE GLUCONATE CLOTH 2 % EX PADS
6.0000 | MEDICATED_PAD | Freq: Every day | CUTANEOUS | Status: DC
  Administered 2019-09-27: 6 via TOPICAL

## 2019-09-27 NOTE — Plan of Care (Signed)

## 2019-09-27 NOTE — Care Management Important Message (Signed)
Important Message  Patient Details  Name: Aaron [name] MRN: [id number] Date of Birth: 1956-02-02   Medicare Important Message Given:  Yes     Yanna Leaks 09/27/2019, 1:30 PM

## 2019-09-27 NOTE — Progress Notes (Addendum)
AVS given and reviewed with pt. Medications discussed. All questions answered to satisfaction. Abdominal binder provided to pt. Pt verbalized understanding of information given, including wound dressing care. Pt escorted off the unit with all belongings via wheelchair by volunteer services.

## 2019-09-27 NOTE — Discharge Summary (Signed)
Physician Discharge Summary  Aaron [name] ZCH:[id number] DOB: 1955-05-01 DOA: 09/20/2019  PCP: [facility], [place] date: 09/20/2019 Discharge date: 09/27/2019  Admitted From: Home Disposition: Home  Recommendations for Outpatient Follow-up:  1. Follow ups as below. 2. Please obtain CBC/BMP/Mag at follow up 3. Please follow up on the following pending results: None  Home Health: PT/OT/RN Equipment/Devices: None  Discharge Condition: Stable  CODE STATUS: DNR/DNR   Follow-up Information    Jesusita Oka, MD. Go on 10/06/2019.   Specialty: Surgery Why: Your appointment is 6/24 at 11:15am Please arrive 30 minutes prior to your appointment to check in and fill out paperwork. Bring photo ID and insurance information. Contact information: [place] [address] [phone]                Hospital Course: 65 y.o.malewith history of ESRD on HD MWF, A. fib, COPD, diastolic CHF, recent hospitalization for SBO s/p ex lap with retention sutures for laparotomy wound presented to [facility] with acute on chronic difficulty breathing and productive cough. Patient missed4 HDsessions,because of increasing pain, from his surgical incisionin his abdomen. He also reports abdominal distention.  He also had a fall at home where he landed on his backside.  Denies hitting his head or loss of consciousness.  On admission patient was found to have hyperkalemia to 7.5.  He was also in A. fib with RVR and started on Cardizem drip.  There is also concern about pneumonia for which he was started on antibiotics.  General surgery consulted for laparotomy wound dehiscence and possible infection.  He was transferred to [facility] for hemodialysis.   Patient received back-to-back dialysis on 6/13 and 6/11.  Cleared for discharge by general surgery and nephrology.  Surgery recommended daily dressing change for abdominal wound.   Home  health PT/OT/RN ordered on discharge.  Of note, patient refused SNF placement.  Syndrome problem list below for more hospital course.  Discharge Diagnoses:  Severe hyperkalemia: K7.5 on admit.  Improved. ESRD/fluid overload: Recently transitioned from PD to HD MWF. -Reportedly missed 4 HD sessions due to pain prior to admission -Cleared for discharge by nephrology.  A. fib with RVR-now rate controlled on p.o. metoprolol and Cardizem. -Continue metoprolol 25 mg twice daily and Cardizem CD 180 mg daily -Continue Eliquis  Pneumonia?-Unlikely.  Patient has no fever or leukocytosis.  X-ray findings are nonspecific and could be due to fluid overload in the setting of missed dialysis or atelectasis from shallow breathing due to surgical wound pain.  -Completed course of ceftriaxone for LLE cellulitis through 06/15  Left foot cellulitis/wound/pain: Improved.  X-ray without acute finding. -Completed course of IV ceftriaxone -Recommended keeping the wound dry and clean -Advised to use appropriate footwear  Acute on chronic respiratory failure with hypoxia-reported desaturation to upper 70s and lower 80s requiring 4 L to recover.  Likely due to fluid overload.  Resolved. -Fluid management with hemodialysis. -COPD treatments as below.  SBO status post ex lap x2 with abdominal wall closure with retention sutures for fascial dehiscence previous hospitalization. -Cleared for discharge by general surgery.  Wet-to-dry dressing and abdominal binder -Outpatient follow-up with general surgery.  Acute on chronic diastolic CHF-patient has missed multiple hemodialysis treatments since discharge.  Now appears euvolemic after dialysis back-to-back. -Fluid management with dialysis per nephrology.  Chronic COPD-patient with chronic productive cough. -Continue home breathing treatments  Anemia of renal disease: Hgb stable -Recheck CBC at follow-up  Hypervolemic hyponatremia  in the setting of ESRD  and fluid overload: Resolved.  Hyperkalemia:  Resolved.  Hypomagnesemia: Resolved. -Monitor and replete as appropriate  Prolonged QTC-resolved on repeat EKG. -Avoid all minimize QT prolonging drugs  Fall at home/debility -fall seems mechanical.  CT head and left foot x-ray without significant finding. -Therapy recommended home health as patient refused SNF.  GERD -P.o. Protonix                 Discharge Exam: Vitals:   09/27/19 0835 09/27/19 1006  BP:  133/86  Pulse:  76  Resp:  18  Temp:  98.6 F (37 C)  SpO2: 94% 97%    GENERAL: No apparent distress.  Nontoxic. HEENT: MMM.  Vision and hearing grossly intact.  NECK: Supple.  No apparent JVD.  RESP:  No IWOB.  Fair aeration bilaterally. CVS:  RRR. Heart sounds normal.  ABD/GI/GU: Bowel sounds present. Soft.  Dressing and abdominal binder DCI MSK/EXT:  Moves extremities. No apparent deformity. No edema.  SKIN: Laparotomy wound.  Bruising over plantar aspect of left toe appears clean and dry. NEURO: Awake, alert and oriented appropriately.  No apparent focal neuro deficit. PSYCH: Calm. Normal affect.   Discharge Instructions  Discharge Instructions    (HEART FAILURE PATIENTS) Call MD:  Anytime you have any of the following symptoms: 1) 3 pound weight gain in 24 hours or 5 pounds in 1 week 2) shortness of breath, with or without a dry hacking cough 3) swelling in the hands, feet or stomach 4) if you have to sleep on extra pillows at night in order to breathe.   Complete by: As directed    Call MD for:  difficulty breathing, headache or visual disturbances   Complete by: As directed    Call MD for:  extreme fatigue   Complete by: As directed    Call MD for:  persistant dizziness or light-headedness   Complete by: As directed    Call MD for:  persistant nausea and vomiting   Complete by: As directed    Call MD for:  redness, tenderness, or signs of infection (pain, swelling, redness, odor or green/yellow  discharge around incision site)   Complete by: As directed    Call MD for:  severe uncontrolled pain   Complete by: As directed    Call MD for:  temperature >100.4   Complete by: As directed    Diet - low sodium heart healthy   Complete by: As directed    Discharge instructions   Complete by: As directed    It has been a pleasure taking care of you!  You were hospitalized with shortness of breath due to fluid overload from not having dialysis.  It is very important that you go to your dialysis sessions regularly and take your medications as prescribed.  Keep your left foot wound clean and dry.  We also recommend wearing appropriate shoe.  Avoid walking barefoot.   We may have started you on other new medications or made some changes to your home medications during this hospitalization. Please review your new medication list and the directions carefully before you take them.    Please go to your hospital follow-up appointments or call to reschedule as recommended.   Take care,   Increase activity slowly   Complete by: As directed    No dressing needed   Complete by: As directed    Recommend keeping wound dry and clean     Allergies as of 09/27/2019  No Known Allergies     Medication List    STOP taking these medications   cephALEXin 250 MG capsule Commonly known as: KEFLEX   furosemide 40 MG tablet Commonly known as: LASIX     TAKE these medications   albuterol 108 (90 Base) MCG/ACT inhaler Commonly known as: VENTOLIN HFA Inhale 2 puffs into the lungs every 6 (six) hours as needed for wheezing or shortness of breath.   albuterol (2.5 MG/3ML) 0.083% nebulizer solution Commonly known as: PROVENTIL Inhale 3 mLs into the lungs 3 (three) times daily as needed for shortness of breath or wheezing.   apixaban 5 MG Tabs tablet Commonly known as: ELIQUIS Take 1 tablet (5 mg total) by mouth 2 (two) times daily.   atorvastatin 80 MG tablet Commonly known as: LIPITOR Take  80 mg by mouth daily.   Breo Ellipta 200-25 MCG/INH Aepb Generic drug: fluticasone furoate-vilanterol Inhale 1 puff into the lungs daily as needed (for respiratory issues.).   calcium acetate 667 MG capsule Commonly known as: PHOSLO Take 667 mg by mouth 3 (three) times daily with meals.   Darbepoetin Alfa 60 MCG/0.3ML Sosy injection Commonly known as: ARANESP Inject 0.3 mLs (60 mcg total) into the vein every Wednesday with hemodialysis.   diltiazem 180 MG 24 hr capsule Commonly known as: CARDIZEM CD Take 1 capsule (180 mg total) by mouth daily. Start taking on: September 28, 2019 What changed:   medication strength  how much to take Notes to patient: **DECREASED DOSE** To lower blood pressure   docusate sodium 100 MG capsule Commonly known as: COLACE Take 1 capsule (100 mg total) by mouth daily.   gabapentin 300 MG capsule Commonly known as: NEURONTIN Take 1 capsule (300 mg total) by mouth at bedtime. What changed: when to take this Notes to patient: **DECREASED DOSE** For nerve pain. May cause drowsiness and dizziness   metoprolol tartrate 25 MG tablet Commonly known as: LOPRESSOR Take 1 tablet (25 mg total) by mouth 2 (two) times daily.   multivitamin Tabs tablet Take 1 tablet by mouth at bedtime.   ondansetron 4 MG tablet Commonly known as: ZOFRAN Take 1 tablet (4 mg total) by mouth every 8 (eight) hours as needed for nausea.   oxyCODONE-acetaminophen 10-325 MG tablet Commonly known as: PERCOCET Take 1 tablet by mouth 5 (five) times daily as needed for pain.   pantoprazole 40 MG tablet Commonly known as: PROTONIX Take 1 tablet (40 mg total) by mouth 2 (two) times daily. What changed: when to take this   senna-docusate 8.6-50 MG tablet Commonly known as: Senokot-S Take 1 tablet by mouth 2 (two) times daily as needed for moderate constipation.   varenicline 0.5 MG tablet Commonly known as: CHANTIX Take 0.5 mg by mouth 2 (two) times daily.              Discharge Care Instructions  (From admission, onward)         Start     Ordered   09/27/19 0000  No dressing needed for left toe wound     Comments: Recommend keeping wound dry and clean   09/27/19 1158          Consultations:  General surgery  Nephrology  Procedures/Studies:   DG Abdomen 1 View  Result Date: 09/20/2019 CLINICAL DATA:  Abdominal distension EXAM: ABDOMEN - 1 VIEW COMPARISON:  09/03/2019 FINDINGS: Nonobstructive bowel gas pattern. Moderate volume of stool projects throughout the colon. Catheter tubing overlies the central abdomen. Peritoneal catheter tubing within  the central pelvis. Right iliac vascular stent. Severe arthropathy of the right hip joint. The visualized portion of the chest demonstrates a large bore catheter terminating at the level of the superior cavoatrial junction. IMPRESSION: Nonobstructive bowel gas pattern. Moderate volume of stool throughout the colon. Electronically Signed   By: Davina Poke D.O.   On: 09/20/2019 14:35   CT HEAD WO CONTRAST  Result Date: 09/20/2019 CLINICAL DATA:  Fall last night striking head on sofa. On anticoagulation. EXAM: CT HEAD WITHOUT CONTRAST TECHNIQUE: Contiguous axial images were obtained from the base of the skull through the vertex without intravenous contrast. COMPARISON:  Head CT 03/14/2019 FINDINGS: Brain: No acute hemorrhage. Stable degree of atrophy and chronic small vessel ischemia. Extra-axial CSF density in the right middle cranial fossa is unchanged from prior exam, most consistent with arachnoid cyst. No midline shift or mass effect. No evidence of acute ischemia. Vascular: Atherosclerosis of skullbase vasculature without hyperdense vessel or abnormal calcification. Skull: No fracture or focal lesion. Sinuses/Orbits: Chronic opacification of right maxillary sinus. Right mastoid air cells are hypo pneumatized. No skull fracture or acute osseous abnormality. Other: None. IMPRESSION: 1. No acute intracranial  abnormality. No skull fracture. 2. Stable atrophy and chronic small vessel ischemia. 3. Unchanged right middle cranial fossa arachnoid cyst. Electronically Signed   By: Keith Rake M.D.   On: 09/20/2019 21:58   CT ABDOMEN PELVIS W CONTRAST  Addendum Date: 09/03/2019   ADDENDUM REPORT: 09/03/2019 18:02 ADDENDUM: It should be noted that upon further evaluation, patient has a surgical incision site along the midline of the abdomen. A mild amount of subcutaneous inflammatory fat stranding is seen without evidence of an organized fluid collection or abscess. A few small foci of air density is also seen within the subcutaneous fat. A mild amount of intra-abdominal free air is seen consistent with the patient's recent abdominal surgical intervention. Results were discussed with Dr. Tyrell Antonio at 5:59 p.m. [place] on Sep 03, 2019. Electronically Signed   By: Virgina Norfolk M.D.   On: 09/03/2019 18:02   Result Date: 09/03/2019 CLINICAL DATA:  Evaluate for small bowel obstruction. EXAM: CT ABDOMEN AND PELVIS WITH CONTRAST TECHNIQUE: Multidetector CT imaging of the abdomen and pelvis was performed using the standard protocol following bolus administration of intravenous contrast. CONTRAST:  14m OMNIPAQUE IOHEXOL 300 MG/ML  SOLN COMPARISON:  Aug 25, 2019 FINDINGS: Lower chest: Mild atelectasis and/or early infiltrate is seen within the posterior aspect of the right lung base. There is a small anterior pericardial effusion. Hepatobiliary: Innumerable subcentimeter cysts are seen scattered throughout the liver parenchyma. No gallstones, gallbladder wall thickening, or biliary dilatation. Pancreas: Unremarkable. No pancreatic ductal dilatation or surrounding inflammatory changes. Spleen: Normal in size without focal abnormality. Adrenals/Urinary Tract: Adrenal glands are unremarkable. Innumerable cysts of various sizes are seen within both kidneys. A 5.7 cm x 4.6 cm well-defined heterogeneous mildly increased  attenuation lesion is seen within the anteromedial aspect of the mid left kidney. A mild amount of air is seen within the lumen of an otherwise normal appearing urinary bladder. Stomach/Bowel: Stomach is within normal limits. The appendix is not clearly identified. Numerous dilated small bowel loops are seen throughout the mid and lower left abdomen (maximum small bowel diameter of approximately 4.4 cm). A transition zone is seen within the left lower quadrant. Noninflamed diverticula are seen throughout the large bowel. Vascular/Lymphatic: There is marked severity aortic calcification. No enlarged abdominal or pelvic lymph nodes. Reproductive: The prostate gland is mildly enlarged. Other: The distal  portion of a ventriculoperitoneal shunt is seen with its distal tip noted within the pelvis. Musculoskeletal: Marked severity multilevel degenerative changes seen throughout the lumbar spine. Additional findings suggestive of prior discitis/osteomyelitis is seen at the level of L1-L2. IMPRESSION: 1. Findings consistent with a partial small bowel obstruction with a transition zone seen within the left lower quadrant. 2. Innumerable hepatic and renal cysts consistent with polycystic kidney disease. 3. Colonic diverticulosis. 4. Mild atelectasis and/or early infiltrate within the posterior aspect of the right lung base. Aortic Atherosclerosis (ICD10-I70.0). Electronically Signed: By: Virgina Norfolk M.D. On: 09/03/2019 17:55   IR Fluoro Guide CV Line Left  Result Date: 08/31/2019 INDICATION: 64 year old with end-stage renal disease. Patient currently has a non tunneled right jugular catheter and needs a new tunneled dialysis catheter. The right jugular catheter was difficult to place and plan for placement of a left jugular tunneled dialysis catheter. EXAM: FLUOROSCOPIC AND ULTRASOUND GUIDED PLACEMENT OF A TUNNELED DIALYSIS CATHETER Physician: Stephan Minister. Anselm Pancoast, MD MEDICATIONS: Ancef 2 g; The antibiotic was administered  within an appropriate time interval prior to skin puncture. ANESTHESIA/SEDATION: Versed 2.0 mg IV; Fentanyl 100 mcg IV; Moderate Sedation Time:  16 minutes The patient was continuously monitored during the procedure by the interventional radiology nurse under my direct supervision. FLUOROSCOPY TIME:  Fluoroscopy Time: 30 seconds, 5.3 mGy COMPLICATIONS: None immediate. PROCEDURE: The procedure was explained to the patient. The risks and benefits of the procedure were discussed and the patient's questions were addressed. Informed consent was obtained from the patient. The patient was placed supine on the interventional table. Ultrasound confirmed a patent left internal jugular vein. Ultrasound images were obtained for documentation. The left neck and chest was prepped and draped in a sterile fashion. The left neck was anesthetized with 1% lidocaine. Maximal barrier sterile technique was utilized including caps, mask, sterile gowns, sterile gloves, sterile drape, hand hygiene and skin antiseptic. A small incision was made with #11 blade scalpel. A 21 gauge needle directed into the left internal jugular vein with ultrasound guidance. A micropuncture dilator set was placed. A 28 cm tip to cuff Palindrome catheter was selected. The skin below the left clavicle was anesthetized and a small incision was made with an #11 blade scalpel. A subcutaneous tunnel was formed to the vein dermatotomy site. The catheter was brought through the tunnel. The vein dermatotomy site was dilated to accommodate a peel-away sheath. The catheter was placed through the peel-away sheath and directed into the central venous structures. The tip of the catheter was placed in the right atrium with fluoroscopy. Fluoroscopic images were obtained for documentation. Both lumens were found to aspirate and flush well. The proper amount of heparin was flushed in both lumens. The vein dermatotomy site was closed using a single layer of absorbable suture and  Dermabond. Gel-Foam was placed in the subcutaneous tunnel. The catheter was secured to the skin using Prolene suture. Right jugular catheter was removed at the end of the procedure. IMPRESSION: Successful placement of a left jugular tunneled dialysis catheter using ultrasound and fluoroscopic guidance. Electronically Signed   By: Markus Daft M.D.   On: 08/31/2019 15:29   IR Fluoro Guide CV Line Right  Result Date: 08/30/2019 INDICATION: 64 year old male with a history of chronic renal disease referred for hemodialysis catheter placement. EXAM: IMAGE GUIDED PLACEMENT OF TEMPORARY HEMODIALYSIS CATHETER MEDICATIONS: None ANESTHESIA/SEDATION: None FLUOROSCOPY TIME:  Fluoroscopy Time: 5 minutes 42 seconds (40 mGy). COMPLICATIONS: None PROCEDURE: Informed written consent was obtained from the patient after  a discussion of the risks, benefits, and alternatives to treatment. Questions regarding the procedure were encouraged and answered. The right neck was prepped with chlorhexidine in a sterile fashion, and a sterile drape was applied covering the operative field. Maximum barrier sterile technique with sterile gowns and gloves were used for the procedure. A timeout was performed prior to the initiation of the procedure. Ultrasound was performed demonstrating patent right internal jugular vein. A micropuncture kit was utilized to access the right internal jugular vein under direct, real-time ultrasound guidance after the overlying soft tissues were anesthetized with 1% lidocaine with epinephrine. Ultrasound image documentation was performed. With the initial needle placement in the supraclavicular region, the microwire would not pass centrally. Limited venogram was performed with contrast. This demonstrated scarring and small caliber of the vein, with patency of the SVC. Needle was withdrawn. A second access site more cephalad was selected. 1% lidocaine was used for local anesthesia. Micropuncture needle was used to  access the right internal jugular vein. The Nitrex microwire was navigated into a position in the low internal jugular vein. The microwire would not cross into the SVC. The micro puncture kit was advanced over the wire into the low IJ. The microwire and the inner dilator were removed. A standard Glidewire was then used to navigate into the subclavian vein. Angled catheter was then placed on the Glidewire, and using an angled catheter and the Glidewire we were able to successfully navigate the scarred region of the low internal jugular vein. The Glidewire was then used to make a measurement for the length of the catheter. A 20 cm temp cath was selected. Guidewire was advanced to the level of the right atrium. A 20 cm hemodialysis catheter was then placed over the wire. Final catheter positioning was confirmed and documented with a spot radiographic image. The catheter aspirates and flushes normally. The catheter was flushed with appropriate volume heparin dwells. Dressings were applied. The patient tolerated the procedure well without immediate post procedural complication. 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 A low puncture will not allowed navigation of the scar region. The current temporary hemodialysis catheter cannot be converted. Would advise alternative site if the patient needs a tunneled hemodialysis catheter. IMPRESSION: Status post image guided placement of temporary hemodialysis catheter. Signed, Dulcy Fanny. Dellia Nims, RPVI [facility] Electronically Signed   By: Corrie Mckusick D.O.   On: 08/30/2019 11:26   IR US Guide Vasc Access Left  Result Date: 08/31/2019 INDICATION: 64 year old with end-stage renal disease. Patient currently has a non tunneled right jugular catheter and needs a new tunneled dialysis catheter. The right jugular catheter was difficult to place and plan for placement of a left jugular  tunneled dialysis catheter. EXAM: FLUOROSCOPIC AND ULTRASOUND GUIDED PLACEMENT OF A TUNNELED DIALYSIS CATHETER Physician: Stephan Minister. Anselm Pancoast, MD MEDICATIONS: Ancef 2 g; The antibiotic was administered within an appropriate time interval prior to skin puncture. ANESTHESIA/SEDATION: Versed 2.0 mg IV; Fentanyl 100 mcg IV; Moderate Sedation Time:  16 minutes The patient was continuously monitored during the procedure by the interventional radiology nurse under my direct supervision. FLUOROSCOPY TIME:  Fluoroscopy Time: 30 seconds, 5.3 mGy COMPLICATIONS: None immediate. PROCEDURE: The procedure was explained to the patient. The risks and benefits of the procedure were discussed and the patient's questions were addressed. Informed consent was obtained from the patient. The patient was placed supine on the interventional table. Ultrasound confirmed a patent left internal jugular  vein. Ultrasound images were obtained for documentation. The left neck and chest was prepped and draped in a sterile fashion. The left neck was anesthetized with 1% lidocaine. Maximal barrier sterile technique was utilized including caps, mask, sterile gowns, sterile gloves, sterile drape, hand hygiene and skin antiseptic. A small incision was made with #11 blade scalpel. A 21 gauge needle directed into the left internal jugular vein with ultrasound guidance. A micropuncture dilator set was placed. A 28 cm tip to cuff Palindrome catheter was selected. The skin below the left clavicle was anesthetized and a small incision was made with an #11 blade scalpel. A subcutaneous tunnel was formed to the vein dermatotomy site. The catheter was brought through the tunnel. The vein dermatotomy site was dilated to accommodate a peel-away sheath. The catheter was placed through the peel-away sheath and directed into the central venous structures. The tip of the catheter was placed in the right atrium with fluoroscopy. Fluoroscopic images were obtained for  documentation. Both lumens were found to aspirate and flush well. The proper amount of heparin was flushed in both lumens. The vein dermatotomy site was closed using a single layer of absorbable suture and Dermabond. Gel-Foam was placed in the subcutaneous tunnel. The catheter was secured to the skin using Prolene suture. Right jugular catheter was removed at the end of the procedure. IMPRESSION: Successful placement of a left jugular tunneled dialysis catheter using ultrasound and fluoroscopic guidance. Electronically Signed   By: Markus Daft M.D.   On: 08/31/2019 15:29   IR US Guide Vasc Access Right  Result Date: 08/30/2019 INDICATION: 64 year old male with a history of chronic renal disease referred for hemodialysis catheter placement. EXAM: IMAGE GUIDED PLACEMENT OF TEMPORARY HEMODIALYSIS CATHETER MEDICATIONS: None ANESTHESIA/SEDATION: None FLUOROSCOPY TIME:  Fluoroscopy Time: 5 minutes 42 seconds (40 mGy). COMPLICATIONS: None PROCEDURE: Informed written consent was obtained from the patient after a discussion of the risks, benefits, and alternatives to treatment. Questions regarding the procedure were encouraged and answered. The right neck was prepped with chlorhexidine in a sterile fashion, and a sterile drape was applied covering the operative field. Maximum barrier sterile technique with sterile gowns and gloves were used for the procedure. A timeout was performed prior to the initiation of the procedure. Ultrasound was performed demonstrating patent right internal jugular vein. A micropuncture kit was utilized to access the right internal jugular vein under direct, real-time ultrasound guidance after the overlying soft tissues were anesthetized with 1% lidocaine with epinephrine. Ultrasound image documentation was performed. With the initial needle placement in the supraclavicular region, the microwire would not pass centrally. Limited venogram was performed with contrast. This demonstrated scarring  and small caliber of the vein, with patency of the SVC. Needle was withdrawn. A second access site more cephalad was selected. 1% lidocaine was used for local anesthesia. Micropuncture needle was used to access the right internal jugular vein. The Nitrex microwire was navigated into a position in the low internal jugular vein. The microwire would not cross into the SVC. The micro puncture kit was advanced over the wire into the low IJ. The microwire and the inner dilator were removed. A standard Glidewire was then used to navigate into the subclavian vein. Angled catheter was then placed on the Glidewire, and using an angled catheter and the Glidewire we were able to successfully navigate the scarred region of the low internal jugular vein. The Glidewire was then used to make a measurement for the length of the catheter. A 20 cm temp cath  was selected. Guidewire was advanced to the level of the right atrium. A 20 cm hemodialysis catheter was then placed over the wire. Final catheter positioning was confirmed and documented with a spot radiographic image. The catheter aspirates and flushes normally. The catheter was flushed with appropriate volume heparin dwells. Dressings were applied. The patient tolerated the procedure well without immediate post procedural complication. 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 A low puncture will not allowed navigation of the scar region. The current temporary hemodialysis catheter cannot be converted. Would advise alternative site if the patient needs a tunneled hemodialysis catheter. IMPRESSION: Status post image guided placement of temporary hemodialysis catheter. Signed, Dulcy Fanny. Dellia Nims, RPVI [facility] Radiology Electronically Signed   By: Corrie Mckusick D.O.   On: 08/30/2019 11:26   DG Chest Portable 1 View  Result Date: 09/20/2019 CLINICAL DATA:  Weakness.  Increased shortness of  breath. EXAM: PORTABLE CHEST 1 VIEW COMPARISON:  09/04/2019 FINDINGS: Increased densities at the left lung base compared to the previous examination. Left jugular dialysis catheter is stable with the tip at the SVC and right atrium junction. Lucency in both lungs compatible with chronic lung changes. Heart size is upper limits of normal and stable. Right costophrenic angle is not completely imaged on this examination. Surgical plate in the cervical spine. IMPRESSION: Left basilar chest densities. Findings are nonspecific but could represent atelectasis, pleural fluid or even infection. Stable appearance of the dialysis catheter. Electronically Signed   By: Markus Daft M.D.   On: 09/20/2019 14:03   DG CHEST PORT 1 VIEW  Result Date: 09/04/2019 CLINICAL DATA:  Cough. EXAM: PORTABLE CHEST 1 VIEW COMPARISON:  Aug 29, 2019 FINDINGS: Interval placement of large-bore catheter which terminates at the cavoatrial junction. Cardiomediastinal silhouette is normal. Mediastinal contours appear intact. There is no evidence of focal airspace consolidation, pleural effusion or pneumothorax. Mild bibasilar atelectasis. Osseous structures are without acute abnormality. Soft tissues are grossly normal. IMPRESSION: 1. Mild bibasilar atelectasis versus peribronchial airspace consolidation. 2. Interval placement of large-bore central venous catheter which terminates at the cavoatrial junction. Electronically Signed   By: Fidela Salisbury M.D.   On: 09/04/2019 13:15   DG CHEST PORT 1 VIEW  Result Date: 08/29/2019 CLINICAL DATA:  PICC placement EXAM: PORTABLE CHEST 1 VIEW COMPARISON:  Radiograph 08/29/2019 FINDINGS: Transesophageal tube tip and side port below the GE junction, beyond the margins of imaging. A left upper extremity PICC tip terminates at the superior cavoatrial junction. Telemetry leads overlie the chest. Redemonstration of the patchy right basilar opacity with a background of more chronic coarsened interstitial  change in apical lucency likely reflecting emphysematous features prior cross-sectional imaging. No convincing features of edema. No pneumothorax or visible effusion of the left costophrenic sulcus is partially collimated. The aorta is calcified. The remaining cardiomediastinal contours are unremarkable. No acute osseous or soft tissue abnormality. Degenerative changes are present in the imaged spine and shoulders. IMPRESSION: 1. Left upper extremity PICC tip terminates at the superior cavoatrial junction. 2. Transesophageal tube tip and side port below the GE junction, beyond the margins of imaging. 3. Unchanged patchy right basilar opacity. 4. Coarsened interstitial changes and chronic apical lucency compatible with patient's known COPD. Electronically Signed   By: Lovena Le M.D.   On: 08/29/2019 23:02   DG Chest Port 1 View  Result Date: 08/29/2019 CLINICAL DATA:  64 year old male with respiratory failure. EXAM: PORTABLE CHEST 1 VIEW COMPARISON:  Portable chest 08/25/2019 and earlier. FINDINGS: Portable AP semi upright view at 1723 hours. Enteric tube placed, courses to the left upper quadrant with tip not included. No endotracheal tube identified. Mediastinal contours are stable and within normal limits. Evidence of upper lung emphysema on a prior cervical spine CT 01/31/2018, and continued attenuation of upper lobe bronchovascular markings. Minimal patchy right lung base opacity not significantly changed since 08/22/2019. No pneumothorax, pulmonary edema or pleural effusion identified. IMPRESSION: 1. Enteric tube placed into the stomach with tip not included. 2. Emphysema with minimal right lung base opacity more resembling atelectasis than infection. Electronically Signed   By: Genevie Ann M.D.   On: 08/29/2019 17:34   DG Abd Portable 1V  Result Date: 08/29/2019 CLINICAL DATA:  Small bowel obstruction. EXAM: PORTABLE ABDOMEN - 1 VIEW COMPARISON:  Aug 27, 2019. FINDINGS: Distal tip of nasogastric tube is  seen in expected position of distal stomach. Stable small bowel dilatation is noted concerning for distal small bowel obstruction. IMPRESSION: Distal tip of nasogastric tube is seen in expected position of distal stomach. Electronically Signed   By: Marijo Conception M.D.   On: 08/29/2019 08:50   DG Foot 2 Views Left  Result Date: 09/20/2019 CLINICAL DATA:  History of recent fall. EXAM: LEFT FOOT - 2 VIEW COMPARISON:  Aug 22, 2019 FINDINGS: There is no evidence of an acute fracture or dislocation. Chronic deformities are seen involving the distal aspects of the first and second left metatarsals, as well as the middle phalanges of the second and third left toes. Moderate severity degenerative changes are seen involving the metatarsophalangeal articulation of the left great toe. Soft tissues are unremarkable. IMPRESSION: Chronic and degenerative changes, without evidence of acute osseous abnormality. Electronically Signed   By: Virgina Norfolk M.D.   On: 09/20/2019 22:02   [place] EKG SITE RITE  Result Date: 08/29/2019 If Site Rite image not attached, placement could not be confirmed due to current cardiac rhythm.       The results of significant diagnostics from this hospitalization (including imaging, microbiology, ancillary and laboratory) are listed below for reference.     Microbiology: Recent Results (from the past 240 hour(s))  Blood culture (routine x 2)     Status: None   Collection Time: 09/20/19  1:06 PM   Specimen: BLOOD LEFT WRIST  Result Value Ref Range Status   Specimen Description BLOOD LEFT WRIST  Final   Special Requests   Final    BOTTLES DRAWN AEROBIC ONLY Blood Culture adequate volume   Culture   Final    NO GROWTH 6 DAYS Performed at [facility], [address] [place], [place] [address]    Report Status 09/26/2019 FINAL  Final  Blood culture (routine x 2)     Status: None   Collection Time: 09/20/19  1:17 PM   Specimen: Right Antecubital; Blood  Result Value Ref  Range Status   Specimen Description RIGHT ANTECUBITAL  Final   Special Requests   Final    BOTTLES DRAWN AEROBIC AND ANAEROBIC Blood Culture adequate volume   Culture   Final    NO GROWTH 6 DAYS Performed at [facility], [address], [place], [place] [address]    Report Status 09/26/2019 FINAL  Final  SARS Coronavirus 2 by RT PCR (hospital order, performed in [facility] hospital lab) Nasopharyngeal Nasopharyngeal Swab     Status: None   Collection Time: 09/20/19  3:37 PM   Specimen: Nasopharyngeal Swab  Result Value  Ref Range Status   SARS Coronavirus 2 NEGATIVE NEGATIVE Final    Comment: (NOTE) SARS-CoV-2 target nucleic acids are NOT DETECTED. The SARS-CoV-2 RNA is generally detectable in upper and lower respiratory specimens during the acute phase of infection. The lowest concentration of SARS-CoV-2 viral copies this assay can detect is 250 copies / mL. A negative result does not preclude SARS-CoV-2 infection and should not be used as the sole basis for treatment or other patient management decisions.  A negative result may occur with improper specimen collection / handling, submission of specimen other than nasopharyngeal swab, presence of viral mutation(s) within the areas targeted by this assay, and inadequate number of viral copies (<250 copies / mL). A negative result must be combined with clinical observations, patient history, and epidemiological information. Fact Sheet for Patients:   StrictlyIdeas.no Fact Sheet for Healthcare Providers: BankingDealers.co.za This test is not yet approved or cleared  by the [place] FDA and has been authorized for detection and/or diagnosis of SARS-CoV-2 by FDA under an Emergency Use Authorization (EUA).  This EUA will remain in effect (meaning this test can be used) for the duration of the COVID-19 declaration under Section 564(b)(1) of the Act, 21 U.S.C. section 360bbb-3(b)(1),  unless the authorization is terminated or revoked sooner. Performed at [facility], [address] [place], [place] [address]      Labs: BNP (last 3 results) Recent Labs    12/22/18 1345 05/26/19 1542 08/22/19 1930  BNP 625.0* 322.8* 2,947.6*   Basic Metabolic Panel: Recent Labs  Lab 09/22/19 0323 09/23/19 0342 09/24/19 0410 09/26/19 0442 09/27/19 0621  NA 131* 135 135 132* 136  K 5.6* 4.4 4.0 5.4* 5.0  CL 97* 100 99 96* 98  CO2 '23 26 26 25 26  ' GLUCOSE 118* 109* 121* 101* 137*  BUN 64* 31* 23 46* 29*  CREATININE 7.12* 4.50* 3.84* 5.84* 4.32*  CALCIUM 6.9* 6.9* 6.6* 7.6* 7.5*  MG  --  1.5* 1.8 1.7 1.6*  PHOS 6.5* 3.5 2.5 2.9 2.6   Liver Function Tests: Recent Labs  Lab 09/22/19 0323 09/23/19 0342 09/24/19 0410 09/26/19 0442 09/27/19 0621  ALBUMIN 1.4* 1.2* 1.3* 1.5* 1.4*   No results for input(s): LIPASE, AMYLASE in the last 168 hours. No results for input(s): AMMONIA in the last 168 hours. CBC: Recent Labs  Lab 09/21/19 0421 09/21/19 0421 09/22/19 0323 09/22/19 0323 09/23/19 0342 09/24/19 0410 09/26/19 0442 09/27/19 0621 09/27/19 0941  WBC 7.8  --  8.7  --  7.8  --  15.3*  --  10.9*  HGB 8.1*   < > 8.5*   < > 8.3* 8.4* 8.7* 8.4* 8.1*  HCT 25.0*   < > 26.5*   < > 26.4* 27.1* 28.4* 26.9* 26.0*  MCV 96.2  --  96.4  --  97.8  --  99.6  --  98.9  PLT 391  --  410*  --  391  --  334  --  306   < > = values in this interval not displayed.   Cardiac Enzymes: No results for input(s): CKTOTAL, CKMB, CKMBINDEX, TROPONINI in the last 168 hours. BNP: Invalid input(s): POCBNP CBG: No results for input(s): GLUCAP in the last 168 hours. D-Dimer No results for input(s): DDIMER in the last 72 hours. Hgb A1c No results for input(s): HGBA1C in the last 72 hours. Lipid Profile No results for input(s): CHOL, HDL, LDLCALC, TRIG, CHOLHDL, LDLDIRECT in the last 72 hours. Thyroid function studies No results for input(s):  TSH, T4TOTAL, T3FREE, THYROIDAB in the last  72 hours.  Invalid input(s): FREET3 Anemia work up No results for input(s): VITAMINB12, FOLATE, FERRITIN, TIBC, IRON, RETICCTPCT in the last 72 hours. Urinalysis    Component Value Date/Time   COLORURINE YELLOW 05/27/2019 0524   APPEARANCEUR CLEAR 05/27/2019 0524   LABSPEC 1.025 05/27/2019 0524   PHURINE 6.0 05/27/2019 0524   GLUCOSEU 150 (A) 05/27/2019 0524   HGBUR MODERATE (A) 05/27/2019 0524   BILIRUBINUR NEGATIVE 05/27/2019 0524   KETONESUR NEGATIVE 05/27/2019 0524   PROTEINUR 100 (A) 05/27/2019 0524   UROBILINOGEN 0.2 03/03/2010 1056   NITRITE NEGATIVE 05/27/2019 0524   LEUKOCYTESUR TRACE (A) 05/27/2019 0524   Sepsis Labs Invalid input(s): PROCALCITONIN,  WBC,  LACTICIDVEN   Time coordinating discharge: 40 minutes  SIGNED:  Mercy Riding, MD  [facility] 09/27/2019, 7:21 PM  If 7PM-7AM, please contact night-coverage [email or web address] Password [email or web address]

## 2019-09-27 NOTE — Progress Notes (Signed)
[facility] ROUNDING NOTE   Subjective:   This is a 64 year old gentleman with a history of atrial fibrillation COPD congestive heart failure and end-stage renal disease on hemodialysis.  He had a recent hospitalization for small bowel obstruction status post exploratory laparotomy with retention sutures for laparotomy wound presented to [facility] with shortness of breath having missed 4 dialysis sessions.  His dialysis days are Monday Wednesday Friday he is scheduled for dialysis 09/26/2019.  He was transferred to [facility] for surgical consultation.  I appreciate the help and assistance of [place] surgery.  Blood pressure 131/87 pulse 102 temperature 98.6 O2 sats 99% 1.5 L nasal cannula  Sodium 136 potassium 5 chloride 98 CO2 26 BUN 29 creatinine 4.3 glucose 137 calcium 7.5 phosphorus 2.6 magnesium is 1.6 albumin 1.4 hemoglobin 8.4  Eliquis 5 mg twice daily atorvastatin 80 mg daily PhosLo 1.3 g 3 times daily with meals, Cardizem 180 mg daily, Neurontin 200 mg nightly Lopressor 25 mg twice daily.       Objective:  Vital signs in last 24 hours:  Temp:  [98.4 F (36.9 C)-99.1 F (37.3 C)] 98.6 F (37 C) (06/15 0433) Pulse Rate:  [62-97] 66 (06/15 0433) Resp:  [15-19] 17 (06/15 0433) BP: (95-157)/(65-100) 131/87 (06/15 0433) SpO2:  [95 %-100 %] 99 % (06/15 0433) Weight:  [90.4 kg-93.6 kg] 90.4 kg (06/14 1230)  Weight change: 2.5 kg Filed Weights   09/26/19 0412 09/26/19 0855 09/26/19 1230  Weight: 91.1 kg 93.6 kg 90.4 kg    Intake/Output: I/O last 3 completed shifts: In: -  Out: 3075 [Urine:75; Other:3000]   Intake/Output this shift:  No intake/output data recorded.  General:NAD, comfortable, able to lie flat. Heart:RRR, s1s2 nl Lungs: Clear b/l, no crackle Abdomen:soft, abdomen binder present. Extremities:No edema Dialysis Access: Left IJ TDC, site clean   Basic Metabolic Panel: Recent Labs  Lab 09/20/19 1306  09/20/19 2310 09/22/19 0323 09/22/19 0323 09/23/19 0342 09/23/19 0342 09/24/19 0410 09/26/19 0442 09/27/19 0621  NA 130*   < > 131*  --  135  --  135 132* 136  K >7.5*   < > 5.6*  --  4.4  --  4.0 5.4* 5.0  CL 96*   < > 97*  --  100  --  99 96* 98  CO2 18*   < > 23  --  26  --  26 25 26   GLUCOSE 87   < > 118*  --  109*  --  121* 101* 137*  BUN 108*   < > 64*  --  31*  --  23 46* 29*  CREATININE 10.00*   < > 7.12*  --  4.50*  --  3.84* 5.84* 4.32*  CALCIUM 8.1*   < > 6.9*   < > 6.9*   < > 6.6* 7.6* 7.5*  MG 2.1  --   --   --  1.5*  --  1.8 1.7 1.6*  PHOS  --    < > 6.5*  --  3.5  --  2.5 2.9 2.6   < > = values in this interval not displayed.    Liver Function Tests: Recent Labs  Lab 09/20/19 1306 09/20/19 1306 09/22/19 0323 09/23/19 0342 09/24/19 0410 09/26/19 0442 09/27/19 0621  AST 48*  --   --   --   --   --   --   ALT 8  --   --   --   --   --   --  ALKPHOS 148*  --   --   --   --   --   --   BILITOT 0.5  --   --   --   --   --   --   PROT 6.3*  --   --   --   --   --   --   ALBUMIN 2.0*   < > 1.4* 1.2* 1.3* 1.5* 1.4*   < > = values in this interval not displayed.   No results for input(s): LIPASE, AMYLASE in the last 168 hours. No results for input(s): AMMONIA in the last 168 hours.  CBC: Recent Labs  Lab 09/20/19 1306 09/20/19 1306 09/21/19 0421 09/21/19 0421 09/22/19 0323 09/23/19 0342 09/24/19 0410 09/26/19 0442 09/27/19 0621  WBC 12.6*  --  7.8  --  8.7 7.8  --  15.3*  --   NEUTROABS 10.8*  --   --   --   --   --   --   --   --   HGB 9.9*   < > 8.1*   < > 8.5* 8.3* 8.4* 8.7* 8.4*  HCT 31.1*   < > 25.0*   < > 26.5* 26.4* 27.1* 28.4* 26.9*  MCV 97.5  --  96.2  --  96.4 97.8  --  99.6  --   PLT 594*  --  391  --  410* 391  --  334  --    < > = values in this interval not displayed.    Cardiac Enzymes: No results for input(s): CKTOTAL, CKMB, CKMBINDEX, TROPONINI in the last 168 hours.  BNP: Invalid input(s): POCBNP  CBG: Recent Labs  Lab  09/20/19 1522  GLUCAP 72    Microbiology: Results for orders placed or performed during the hospital encounter of 09/20/19  Blood culture (routine x 2)     Status: None   Collection Time: 09/20/19  1:06 PM   Specimen: BLOOD LEFT WRIST  Result Value Ref Range Status   Specimen Description BLOOD LEFT WRIST  Final   Special Requests   Final    BOTTLES DRAWN AEROBIC ONLY Blood Culture adequate volume   Culture   Final    NO GROWTH 6 DAYS Performed at [facility], [address], [place], [place] [address]    Report Status 09/26/2019 FINAL  Final  Blood culture (routine x 2)     Status: None   Collection Time: 09/20/19  1:17 PM   Specimen: Right Antecubital; Blood  Result Value Ref Range Status   Specimen Description RIGHT ANTECUBITAL  Final   Special Requests   Final    BOTTLES DRAWN AEROBIC AND ANAEROBIC Blood Culture adequate volume   Culture   Final    NO GROWTH 6 DAYS Performed at [facility], [address], [place], [place] [address]    Report Status 09/26/2019 FINAL  Final  SARS Coronavirus 2 by RT PCR (hospital order, performed in [facility] hospital lab) Nasopharyngeal Nasopharyngeal Swab     Status: None   Collection Time: 09/20/19  3:37 PM   Specimen: Nasopharyngeal Swab  Result Value Ref Range Status   SARS Coronavirus 2 NEGATIVE NEGATIVE Final    Comment: (NOTE) SARS-CoV-2 target nucleic acids are NOT DETECTED. The SARS-CoV-2 RNA is generally detectable in upper and lower respiratory specimens during the acute phase of infection. The lowest concentration of SARS-CoV-2 viral copies this assay can detect is 250 copies / mL. A negative result does not preclude SARS-CoV-2  infection and should not be used as the sole basis for treatment or other patient management decisions.  A negative result may occur with improper specimen collection / handling, submission of specimen other than nasopharyngeal swab, presence of viral mutation(s) within the areas targeted by this  assay, and inadequate number of viral copies (<250 copies / mL). A negative result must be combined with clinical observations, patient history, and epidemiological information. Fact Sheet for Patients:   StrictlyIdeas.no Fact Sheet for Healthcare Providers: BankingDealers.co.za This test is not yet approved or cleared  by the [place] FDA and has been authorized for detection and/or diagnosis of SARS-CoV-2 by FDA under an Emergency Use Authorization (EUA).  This EUA will remain in effect (meaning this test can be used) for the duration of the COVID-19 declaration under Section 564(b)(1) of the Act, 21 U.S.C. section 360bbb-3(b)(1), unless the authorization is terminated or revoked sooner. Performed at [facility], [address] [place], [place] [address]     Coagulation Studies: No results for input(s): LABPROT, INR in the last 72 hours.  Urinalysis: No results for input(s): COLORURINE, LABSPEC, PHURINE, GLUCOSEU, HGBUR, BILIRUBINUR, KETONESUR, PROTEINUR, UROBILINOGEN, NITRITE, LEUKOCYTESUR in the last 72 hours.  Invalid input(s): APPERANCEUR    Imaging: No results found.   Medications:   . cefTRIAXone (ROCEPHIN)  IV 1 g (09/26/19 1701)   . acetaminophen  1,000 mg Oral Q8H  . apixaban  5 mg Oral BID  . atorvastatin  80 mg Oral Daily  . calcium acetate  1,334 mg Oral TID WC  . Chlorhexidine Gluconate Cloth  6 each Topical Daily  . Chlorhexidine Gluconate Cloth  6 each Topical Q0600  . darbepoetin (ARANESP) injection - DIALYSIS  100 mcg Intravenous Q Wed-HD  . diclofenac Sodium  2 g Topical QID  . diltiazem  180 mg Oral Daily  . fluticasone furoate-vilanterol  1 puff Inhalation Daily  . gabapentin  200 mg Oral QHS  . metoprolol tartrate  25 mg Oral BID  . pantoprazole  40 mg Oral Daily   guaiFENesin-dextromethorphan, HYDROmorphone (DILAUDID) injection, ipratropium-albuterol, ondansetron (ZOFRAN) IV, oxyCODONE,  polyethylene glycol, senna-docusate  Assessment/ Plan:   1. Dehiscent abdomen wall wound: Status post ex lap for SBO.  Currently on antibiotics.  General surgery is following.  2.  ESRD outpatient dialysis [place].  Monday Wednesday Friday schedule dialysis scheduled for 09/26/2019.  Next dialysis treatment will be 09/28/2019.  Tolerated 3 L ultrafiltration.  Will check with renal navigator regarding placement.  I believe he does have a dialysis slot in [employer].  3. Anemia due to recent surgery and CKD: Continue ESA.  No iron because of ongoing infection.  Monitor hemoglobin.  4. CKD-MBD: Phosphorus low therefore reduce the dose of calcium acetate.  Monitor phosphorus and calcium level.  5.  HTN/volume: Monitor blood pressure.    LOS: [place] @TODAY @7 :37 AM

## 2019-09-27 NOTE — Progress Notes (Signed)
Renal Navigator met with patient who is well known to Navigator from his most recent hospitalization in May 2021. Patient states he feels ready to go home and that is the only thing on his mind. Navigator states that per Nephrologist/Dr. Justin Mend, the plan is discharge, however, Navigator is aware that patient has not gone to OP HD since he discharged from the hospital on 5/30, which is concerning. Patient states he has not been able to walk because of foot pain, but that his foot has felt much better since his inpt MD has prescribed Voltaren gel. He is hopeful that he will get this Rx to go home. Navigator suggests he discuss this with his MD prior to discharge. Navigator discussed the importance of OP HD and the fact that his center requires evaluation in the hospital if he misses 3 or more treatments in a row. He states that he has spoken with his family and explained that he "cannot do this on my own," and they have promised to help. He states that his ex-wife's boyfriend/[name] has agreed to transport him to/from OP HD and he vows not to miss his treatments moving forward. Navigator asked that he call Tharon Aquas while we are together now to confirm. Tharon Aquas did not answer, but he called his ex-wife/June and she said, "you know he will take you." Navigator spoke with Joy/Administrative Assistant at [facility], who confirms that patient's seat time has not changed. He needs to arrive at 11:30am on MWF for an 11:45am treatment time. Navigator confirmed this with patient who agrees. Navigator updated Nephrologist that patient is cleared for discharge from an OP HD standpoint. Navigator will fax discharge records to OP HD clinic when available.   Aaron [name], [place] Renal Navigator [phone]

## 2019-09-27 NOTE — TOC Initial Note (Signed)
Transition of Care [facility]) - Initial/Assessment Note    Patient Details  Name: Aaron [name] MRN: [id number] Date of Birth: 1955/08/11  [facility] CM/SW Contact:    Marilu Favre, RN Phone Number: 09/27/2019, 12:24 PM  Clinical Narrative:                  Patient from home alone. Active with [facility] and wants to continue services with [facility]. [place] with [facility] accepted patient.   Patient has a dressing change , he and his wife have been taught and wife assists him at home.     Expected Discharge Plan: [place] Barriers to Discharge: No Barriers Identified   Patient Goals and CMS Choice Patient states their goals for this hospitalization and ongoing recovery are:: to return to home CMS Medicare.gov Compare Post Acute Care list provided to:: Patient Choice offered to / list presented to : Patient  Expected Discharge Plan and Services Expected Discharge Plan: [place]   Discharge Planning Services: CM Consult Post Acute Care Choice: [place] arrangements for the past 2 months: Apartment Expected Discharge Date: 09/27/19               DME Arranged: N/A         HH Arranged: RN, PT, OT [place] Agency: [facility] (formerly [employer]) Date [place]: 09/27/19 Time [place]: 1223 Representative spoke with at [place]: [place] Arrangements/Services Living arrangements for the past 2 months: [place] with:: Self Patient language and need for interpreter reviewed:: Yes Do you feel safe going back to the place where you live?: Yes      Need for Family Participation in Patient Care: Yes (Comment) Care giver support system in place?: Yes (comment)   Criminal Activity/Legal Involvement Pertinent to Current Situation/Hospitalization: No - Comment as needed  Activities of Daily Living Home Assistive Devices/Equipment: Cane (specify quad or  straight), Eyeglasses, Shower chair with back ADL Screening (condition at time of admission) Patient's cognitive ability adequate to safely complete daily activities?: Yes Is the patient deaf or have difficulty hearing?: No Does the patient have difficulty seeing, even when wearing glasses/contacts?: No Does the patient have difficulty concentrating, remembering, or making decisions?: No Patient able to express need for assistance with ADLs?: Yes Does the patient have difficulty dressing or bathing?: No Independently performs ADLs?: Yes (appropriate for developmental age) Does the patient have difficulty walking or climbing stairs?: Yes Weakness of Legs: Both Weakness of Arms/Hands: None  Permission Sought/Granted   Permission granted to share information with : No              Emotional Assessment Appearance:: Appears stated age Attitude/Demeanor/Rapport: Engaged Affect (typically observed): Accepting Orientation: : Oriented to Self, Oriented to Place, Oriented to  Time, Oriented to Situation Alcohol / Substance Use: Not Applicable Psych Involvement: No (comment)  Admission diagnosis:  Hyperkalemia [E87.5] Fall [W19.XXXA] Stage 5 chronic kidney disease on chronic dialysis ([place]) [N18.6, Z99.2] Patient Active Problem List   Diagnosis Date Noted  . Hyperkalemia 09/20/2019  . Pain at surgical site   . Goals of care, counseling/discussion   . Palliative care encounter   . Atrial fibrillation with rapid ventricular response ([place])   . ESRD on peritoneal dialysis ([place])   . SBO (small bowel obstruction) ([place])   . DNR (do not resuscitate)   . Malnutrition of moderate degree 08/31/2019  .  Acute exacerbation of CHF (congestive heart failure) ([place]) 08/23/2019  . Atrial fibrillation with RVR ([place]) 08/23/2019  . Exertional dyspnea 08/22/2019  . Hyperglycemia 05/27/2019  . Syncope 05/26/2019  . Vertebral osteomyelitis ([place]) 04/02/2018  . Back pain 03/31/2018  . Noncompliance with  medication regimen 03/31/2018  . CKD (chronic kidney disease) stage 5, GFR less than 15 ml/min (HCC) 02/03/2018  . COPD with acute exacerbation ([place]) 02/03/2018  . Atrial fibrillation, chronic ([place]) 02/03/2018  . Discitis 01/27/2018  . Discitis of lumbar region 01/09/2018  . Paraspinal abscess ([place]) 01/08/2018  . ESRD on dialysis ([place]) 12/30/2017  . Hematoma of arm, right, initial encounter 11/23/2017  . Protein-calorie malnutrition, severe 10/15/2017  . Hyperlipidemia 08/24/2017  . Schatzki's ring of distal esophagus 04/27/2017  . Encounter for therapeutic drug monitoring 03/03/2017  . History of hepatitis C, naturally cleared  02/20/2017  . Ischemic cardiomyopathy 01/27/2017  . Dyslipidemia 01/27/2017  . AF (paroxysmal atrial fibrillation) ([place]) 01/27/2017  . CAD (coronary artery disease) 01/21/2017  . Polycystic kidney disease 12/14/2016  . Syncope, vasovagal 09/23/2014  . COPD (chronic obstructive pulmonary disease) ([place])   . COLD (chronic obstructive lung disease) ([place])   . Depression   . PAD (peripheral artery disease) ([place]) 09/19/2014  . Preop cardiovascular exam 10/28/2010  . Tobacco abuse 10/28/2010  . UNSPECIFIED IRON DEFICIENCY ANEMIA 10/04/2009  . 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  . Essential hypertension 10/04/2009  . Esophageal reflux 10/04/2009  . PRECORDIAL PAIN 10/04/2009   PCP:  Gwenlyn Saran [facility]:   [place], [place] [address] [place] [address] Phone: [phone] Fax: [phone]     Social Determinants of Health (SDOH) Interventions    Readmission Risk Interventions Readmission Risk Prevention Plan 09/01/2019 12/08/2017  Transportation Screening Complete Complete  PCP or Specialist Appt within 3-5 Days Complete -  HRI or Home Care Consult Complete -  Social Work Consult for [place] Planning/Counseling Complete -  Palliative Care Screening Complete -  Medication Review Designer, fashion/clothing) Complete Complete  HRI or Home Care Consult - Complete  Some recent data might be hidden

## 2019-10-01 ENCOUNTER — Inpatient Hospital Stay (HOSPITAL_COMMUNITY)
Admission: AD | Admit: 2019-10-01 | Discharge: 2019-10-13 | DRG: 907 | Disposition: A | Discharge status: Home Health | Payer: 59 | Source: Other Acute Inpatient Hospital | Attending: Internal Medicine | Admitting: Internal Medicine

## 2019-10-01 ENCOUNTER — Inpatient Hospital Stay (HOSPITAL_COMMUNITY): Payer: 59

## 2019-10-01 ENCOUNTER — Encounter (HOSPITAL_COMMUNITY): Payer: Self-pay | Admitting: Pulmonary Disease

## 2019-10-01 DIAGNOSIS — Q613 Polycystic kidney, unspecified: Secondary | ICD-10-CM | POA: Diagnosis not present

## 2019-10-01 DIAGNOSIS — A4153 Sepsis due to Serratia: Secondary | ICD-10-CM | POA: Diagnosis present

## 2019-10-01 DIAGNOSIS — K658 Other peritonitis: Secondary | ICD-10-CM | POA: Diagnosis present

## 2019-10-01 DIAGNOSIS — N186 End stage renal disease: Secondary | ICD-10-CM | POA: Diagnosis present

## 2019-10-01 DIAGNOSIS — R092 Respiratory arrest: Secondary | ICD-10-CM

## 2019-10-01 DIAGNOSIS — R6521 Severe sepsis with septic shock: Secondary | ICD-10-CM

## 2019-10-01 DIAGNOSIS — J441 Chronic obstructive pulmonary disease with (acute) exacerbation: Secondary | ICD-10-CM | POA: Diagnosis present

## 2019-10-01 DIAGNOSIS — I998 Other disorder of circulatory system: Secondary | ICD-10-CM | POA: Diagnosis not present

## 2019-10-01 DIAGNOSIS — Z992 Dependence on renal dialysis: Secondary | ICD-10-CM | POA: Diagnosis not present

## 2019-10-01 DIAGNOSIS — I70245 Atherosclerosis of native arteries of left leg with ulceration of other part of foot: Secondary | ICD-10-CM | POA: Diagnosis not present

## 2019-10-01 DIAGNOSIS — E872 Acidosis: Secondary | ICD-10-CM | POA: Diagnosis present

## 2019-10-01 DIAGNOSIS — I5032 Chronic diastolic (congestive) heart failure: Secondary | ICD-10-CM | POA: Diagnosis present

## 2019-10-01 DIAGNOSIS — I4891 Unspecified atrial fibrillation: Secondary | ICD-10-CM | POA: Diagnosis present

## 2019-10-01 DIAGNOSIS — I252 Old myocardial infarction: Secondary | ICD-10-CM | POA: Diagnosis not present

## 2019-10-01 DIAGNOSIS — I745 Embolism and thrombosis of iliac artery: Secondary | ICD-10-CM | POA: Diagnosis present

## 2019-10-01 DIAGNOSIS — Z79899 Other long term (current) drug therapy: Secondary | ICD-10-CM

## 2019-10-01 DIAGNOSIS — Z7951 Long term (current) use of inhaled steroids: Secondary | ICD-10-CM

## 2019-10-01 DIAGNOSIS — L97519 Non-pressure chronic ulcer of other part of right foot with unspecified severity: Secondary | ICD-10-CM | POA: Diagnosis present

## 2019-10-01 DIAGNOSIS — R55 Syncope and collapse: Secondary | ICD-10-CM | POA: Diagnosis not present

## 2019-10-01 DIAGNOSIS — M79671 Pain in right foot: Secondary | ICD-10-CM | POA: Diagnosis not present

## 2019-10-01 DIAGNOSIS — T8130XS Disruption of wound, unspecified, sequela: Secondary | ICD-10-CM | POA: Diagnosis not present

## 2019-10-01 DIAGNOSIS — A4181 Sepsis due to Enterococcus: Secondary | ICD-10-CM | POA: Diagnosis present

## 2019-10-01 DIAGNOSIS — I70262 Atherosclerosis of native arteries of extremities with gangrene, left leg: Secondary | ICD-10-CM | POA: Diagnosis present

## 2019-10-01 DIAGNOSIS — T148XXA Other injury of unspecified body region, initial encounter: Secondary | ICD-10-CM | POA: Diagnosis not present

## 2019-10-01 DIAGNOSIS — D631 Anemia in chronic kidney disease: Secondary | ICD-10-CM | POA: Diagnosis present

## 2019-10-01 DIAGNOSIS — I493 Ventricular premature depolarization: Secondary | ICD-10-CM | POA: Diagnosis present

## 2019-10-01 DIAGNOSIS — A419 Sepsis, unspecified organism: Secondary | ICD-10-CM | POA: Diagnosis not present

## 2019-10-01 DIAGNOSIS — R0602 Shortness of breath: Secondary | ICD-10-CM

## 2019-10-01 DIAGNOSIS — I7092 Chronic total occlusion of artery of the extremities: Secondary | ICD-10-CM | POA: Diagnosis present

## 2019-10-01 DIAGNOSIS — E875 Hyperkalemia: Secondary | ICD-10-CM | POA: Diagnosis present

## 2019-10-01 DIAGNOSIS — F17210 Nicotine dependence, cigarettes, uncomplicated: Secondary | ICD-10-CM | POA: Diagnosis present

## 2019-10-01 DIAGNOSIS — Z8051 Family history of malignant neoplasm of kidney: Secondary | ICD-10-CM

## 2019-10-01 DIAGNOSIS — Z95820 Peripheral vascular angioplasty status with implants and grafts: Secondary | ICD-10-CM

## 2019-10-01 DIAGNOSIS — L97529 Non-pressure chronic ulcer of other part of left foot with unspecified severity: Secondary | ICD-10-CM | POA: Diagnosis present

## 2019-10-01 DIAGNOSIS — T8571XA Infection and inflammatory reaction due to peritoneal dialysis catheter, initial encounter: Secondary | ICD-10-CM | POA: Diagnosis present

## 2019-10-01 DIAGNOSIS — Z7901 Long term (current) use of anticoagulants: Secondary | ICD-10-CM

## 2019-10-01 DIAGNOSIS — R001 Bradycardia, unspecified: Secondary | ICD-10-CM | POA: Diagnosis present

## 2019-10-01 DIAGNOSIS — D6859 Other primary thrombophilia: Secondary | ICD-10-CM | POA: Diagnosis present

## 2019-10-01 DIAGNOSIS — B1920 Unspecified viral hepatitis C without hepatic coma: Secondary | ICD-10-CM | POA: Diagnosis present

## 2019-10-01 DIAGNOSIS — I4819 Other persistent atrial fibrillation: Secondary | ICD-10-CM | POA: Diagnosis not present

## 2019-10-01 DIAGNOSIS — K219 Gastro-esophageal reflux disease without esophagitis: Secondary | ICD-10-CM | POA: Diagnosis present

## 2019-10-01 DIAGNOSIS — I4821 Permanent atrial fibrillation: Secondary | ICD-10-CM | POA: Diagnosis present

## 2019-10-01 DIAGNOSIS — K659 Peritonitis, unspecified: Secondary | ICD-10-CM | POA: Diagnosis not present

## 2019-10-01 DIAGNOSIS — I132 Hypertensive heart and chronic kidney disease with heart failure and with stage 5 chronic kidney disease, or end stage renal disease: Secondary | ICD-10-CM | POA: Diagnosis present

## 2019-10-01 DIAGNOSIS — Z800 Family history of malignant neoplasm of digestive organs: Secondary | ICD-10-CM

## 2019-10-01 DIAGNOSIS — Z01810 Encounter for preprocedural cardiovascular examination: Secondary | ICD-10-CM | POA: Diagnosis not present

## 2019-10-01 DIAGNOSIS — Z9889 Other specified postprocedural states: Secondary | ICD-10-CM

## 2019-10-01 DIAGNOSIS — Z8249 Family history of ischemic heart disease and other diseases of the circulatory system: Secondary | ICD-10-CM

## 2019-10-01 LAB — COMPREHENSIVE METABOLIC PANEL
ALT: 21 U/L (ref 0–44)
AST: 52 U/L — ABNORMAL HIGH (ref 15–41)
Albumin: 1.4 g/dL — ABNORMAL LOW (ref 3.5–5.0)
Alkaline Phosphatase: 151 U/L — ABNORMAL HIGH (ref 38–126)
Anion gap: 14 (ref 5–15)
BUN: 38 mg/dL — ABNORMAL HIGH (ref 8–23)
CO2: 22 mmol/L (ref 22–32)
Calcium: 6.6 mg/dL — ABNORMAL LOW (ref 8.9–10.3)
Chloride: 98 mmol/L (ref 98–111)
Creatinine, Ser: 5.74 mg/dL — ABNORMAL HIGH (ref 0.61–1.24)
GFR calc Af Amer: 11 mL/min — ABNORMAL LOW (ref >60)
GFR calc non Af Amer: 10 mL/min — ABNORMAL LOW (ref >60)
Glucose, Bld: 84 mg/dL (ref 70–99)
Potassium: 5.4 mmol/L — ABNORMAL HIGH (ref 3.5–5.1)
Sodium: 134 mmol/L — ABNORMAL LOW (ref 135–145)
Total Bilirubin: 0.9 mg/dL (ref 0.3–1.2)
Total Protein: 5 g/dL — ABNORMAL LOW (ref 6.5–8.1)

## 2019-10-01 LAB — GLUCOSE, CAPILLARY
Glucose-Capillary: 77 mg/dL (ref 70–99)
Glucose-Capillary: 120 mg/dL — ABNORMAL HIGH (ref 70–99)
Glucose-Capillary: 110 mg/dL — ABNORMAL HIGH (ref 70–99)
Glucose-Capillary: 87 mg/dL (ref 70–99)

## 2019-10-01 LAB — BODY FLUID CELL COUNT WITH DIFFERENTIAL
Eos, Fluid: 0 %
Lymphs, Fluid: 3 %
Monocyte-Macrophage-Serous Fluid: 3 % — ABNORMAL LOW (ref 50–90)
Neutrophil Count, Fluid: 94 % — ABNORMAL HIGH (ref 0–25)
Total Nucleated Cell Count, Fluid: 5314 cu mm — ABNORMAL HIGH (ref 0–1000)

## 2019-10-01 LAB — LACTIC ACID, PLASMA
Lactic Acid, Venous: 3.6 mmol/L (ref 0.5–1.9)
Lactic Acid, Venous: 3.4 mmol/L (ref 0.5–1.9)

## 2019-10-01 LAB — PHOSPHORUS: Phosphorus: 4.5 mg/dL (ref 2.5–4.6)

## 2019-10-01 LAB — MRSA PCR SCREENING: MRSA by PCR: NEGATIVE

## 2019-10-01 LAB — PROTIME-INR
INR: 1.6 — ABNORMAL HIGH (ref 0.8–1.2)
Prothrombin Time: 18.6 seconds — ABNORMAL HIGH (ref 11.4–15.2)

## 2019-10-01 LAB — MAGNESIUM: Magnesium: 1.3 mg/dL — ABNORMAL LOW (ref 1.7–2.4)

## 2019-10-01 LAB — BRAIN NATRIURETIC PEPTIDE: B Natriuretic Peptide: 3857.9 pg/mL — ABNORMAL HIGH (ref 0.0–100.0)

## 2019-10-01 LAB — APTT: aPTT: 45 seconds — ABNORMAL HIGH (ref 24–36)

## 2019-10-01 MED ORDER — GENTAMICIN SULFATE 0.1 % EX CREA
1.0000 "application " | TOPICAL_CREAM | Freq: Every day | CUTANEOUS | Status: DC
  Administered 2019-10-02: 1 via TOPICAL
  Filled 2019-10-01: qty 15

## 2019-10-01 MED ORDER — GABAPENTIN 300 MG PO CAPS
300.0000 mg | ORAL_CAPSULE | Freq: Every day | ORAL | Status: DC
  Administered 2019-10-01 – 2019-10-12 (×12): 300 mg via ORAL
  Filled 2019-10-01 (×12): qty 1

## 2019-10-01 MED ORDER — FLUTICASONE FUROATE-VILANTEROL 200-25 MCG/INH IN AEPB: 1.0000 | INHALATION_SPRAY | Freq: Every day | RESPIRATORY_TRACT | Status: DC | PRN

## 2019-10-01 MED ORDER — HEPARIN 1000 UNIT/ML FOR PERITONEAL DIALYSIS
INTRAPERITONEAL | Status: DC | PRN
  Filled 2019-10-01: qty 5000

## 2019-10-01 MED ORDER — POLYETHYLENE GLYCOL 3350 17 G PO PACK: 17.0000 g | PACK | Freq: Every day | ORAL | Status: DC | PRN

## 2019-10-01 MED ORDER — SODIUM ZIRCONIUM CYCLOSILICATE 10 G PO PACK
10.0000 g | PACK | Freq: Once | ORAL | Status: AC
  Administered 2019-10-01: 10 g via ORAL
  Filled 2019-10-01: qty 1

## 2019-10-01 MED ORDER — CHLORHEXIDINE GLUCONATE CLOTH 2 % EX PADS
6.0000 | MEDICATED_PAD | Freq: Every day | CUTANEOUS | Status: DC
  Administered 2019-10-01 – 2019-10-13 (×4): 6 via TOPICAL

## 2019-10-01 MED ORDER — HEPARIN 1000 UNIT/ML FOR PERITONEAL DIALYSIS: 500.0000 [IU] | INTRAMUSCULAR | Status: DC | PRN

## 2019-10-01 MED ORDER — ATORVASTATIN CALCIUM 80 MG PO TABS
80.0000 mg | ORAL_TABLET | Freq: Every day | ORAL | Status: DC
  Administered 2019-10-01 – 2019-10-13 (×13): 80 mg via ORAL
  Filled 2019-10-01 (×13): qty 1

## 2019-10-01 MED ORDER — DELFLEX-LC/1.5% DEXTROSE 344 MOSM/L IP SOLN: INTRAPERITONEAL | Status: DC

## 2019-10-01 MED ORDER — ALBUTEROL SULFATE (2.5 MG/3ML) 0.083% IN NEBU
2.5000 mg | INHALATION_SOLUTION | Freq: Four times a day (QID) | RESPIRATORY_TRACT | Status: DC | PRN
  Administered 2019-10-01: 2.5 mg via RESPIRATORY_TRACT
  Filled 2019-10-01: qty 3

## 2019-10-01 MED ORDER — PHENYLEPHRINE HCL-NACL 10-0.9 MG/250ML-% IV SOLN
0.0000 ug/min | INTRAVENOUS | Status: DC
  Administered 2019-10-01: 20 ug/min via INTRAVENOUS
  Administered 2019-10-01: 15 ug/min via INTRAVENOUS
  Administered 2019-10-01: 50 ug/min via INTRAVENOUS

## 2019-10-01 MED ORDER — MIDODRINE HCL 5 MG PO TABS
10.0000 mg | ORAL_TABLET | Freq: Three times a day (TID) | ORAL | Status: DC
  Administered 2019-10-01 – 2019-10-13 (×35): 10 mg via ORAL
  Filled 2019-10-01 (×33): qty 2

## 2019-10-01 MED ORDER — OXYCODONE-ACETAMINOPHEN 5-325 MG PO TABS
1.5000 | ORAL_TABLET | ORAL | Status: DC | PRN
  Administered 2019-10-01 – 2019-10-13 (×42): 1.5 via ORAL
  Filled 2019-10-01 (×42): qty 2

## 2019-10-01 MED ORDER — AMIODARONE IV BOLUS ONLY 150 MG/100ML
150.0000 mg | Freq: Once | INTRAVENOUS | Status: AC
  Administered 2019-10-01: 150 mg via INTRAVENOUS
  Filled 2019-10-01: qty 100

## 2019-10-01 MED ORDER — ALBUTEROL SULFATE (2.5 MG/3ML) 0.083% IN NEBU: 3.0000 mL | INHALATION_SOLUTION | Freq: Three times a day (TID) | RESPIRATORY_TRACT | Status: DC | PRN

## 2019-10-01 MED ORDER — LACTATED RINGERS IV BOLUS
500.0000 mL | Freq: Once | INTRAVENOUS | Status: AC
  Administered 2019-10-01: 500 mL via INTRAVENOUS

## 2019-10-01 MED ORDER — DOCUSATE SODIUM 100 MG PO CAPS: 100.0000 mg | ORAL_CAPSULE | Freq: Two times a day (BID) | ORAL | Status: DC | PRN

## 2019-10-01 MED ORDER — AMIODARONE LOAD VIA INFUSION
150.0000 mg | Freq: Once | INTRAVENOUS | Status: DC
  Filled 2019-10-01: qty 83.34

## 2019-10-01 MED ORDER — AMIODARONE LOAD VIA INFUSION
150.0000 mg | Freq: Once | INTRAVENOUS | Status: DC
  Administered 2019-10-01: 150 mg via INTRAVENOUS
  Filled 2019-10-01: qty 83.34

## 2019-10-01 MED ORDER — APIXABAN 5 MG PO TABS
5.0000 mg | ORAL_TABLET | Freq: Two times a day (BID) | ORAL | Status: DC
  Administered 2019-10-01 – 2019-10-04 (×7): 5 mg via ORAL
  Filled 2019-10-01 (×7): qty 1

## 2019-10-01 NOTE — Progress Notes (Signed)
Secure chat with Lucina Mellow re request for second PIV.  Current PIV currently working, no other medications ordered at this time. States not to have central access placed.  Recommend placing PIV when needed to salvage vessels due to difficulty with labs and PIV attempts.   Requested RN to obtain order for second PIV and to place IV Team consult.  One IV RN has already attempted this am unsuccessfully.

## 2019-10-01 NOTE — Consult Note (Signed)
Cardiology Consultation:   Patient ID: Aaron [name] MRN: [id number]; DOB: 1955/05/25  Admit date: 10/01/2019 Date of Consult: 10/01/2019  Primary Care Provider: Gwenlyn Saran, [facility] Cardiologist: Denman George [facility])  [facility] Electrophysiologist:  None    Patient Profile:   Aaron [name] is a 64 y.o. male with a hx of end-stage renal disease, atrial fibrillation who is being seen today for the evaluation of atrial fibrillation at the request of Kara Mead.  History of Present Illness:   Aaron [name] is a 64 year old man with a history of end-stage renal disease and atrial fibrillation with a recent admission for small bowel obstruction status post ex lap with retention sutures who was at dialysis on 09/30/2019 and became dizzy.  He was mated to the hospital at [facility].  He was found to have a lactate of 6.6 though no significant WBC elevation.  He had no obvious source of infection.  He did report abdominal pain stable since surgery with some diarrhea.  He was transferred to [facility] for the hospital service when he developed atrial fibrillation with rapid rates and was started on diltiazem.  He had a significant drop in his blood pressure and was started on Neo-Synephrine and thus was transferred to critical care medicine.  Per report, it does sound like the patient had respiratory issues and required bag mask ventilation.   Past Medical History:  Diagnosis Date  . Anxiety   . Arthritis    knees , back , shoulders (10/12/2017)  . Atrial fibrillation ([place])   . CAD (coronary artery disease)    Mild nonobstructive disease at cardiac catheterization 2002  . Chronic back pain    "back of my neck; down thru my legs" (10/12/2017)  . COPD (chronic obstructive pulmonary disease) ([place])   . Diverticulitis   . Esophageal reflux   . ESRD (end stage renal disease) on dialysis [facility])    "TTS; [place]" (11/23/2017)  . Essential hypertension   .  Hepatitis C    states he no longer has this  . History of kidney stones   . History of syncope   . Hyperlipidemia   . Jerking 09/23/2014  . Myocardial infarction ([place]) 02/2017   "light one" (10/12/2017)  . Non-compliant behavior    non compliant with diaylsis per daughter  . PAT (paroxysmal atrial tachycardia) ([place])   . Peripheral arterial disease ([place])    Occluded left superficial femoral artery status post stent June 2016 - Dr. Trula Slade  . Pneumonia 1961  . Polycystic kidney, unspecified type   . Syncope 09/2014  . SYNCOPE 05/07/2010   Qualifier: Diagnosis of  By: Laurance Flatten RN, BSN, Anderson Malta      Past Surgical History:  Procedure Laterality Date  . ABDOMINAL AORTOGRAM N/A 08/11/2017   Procedure: ABDOMINAL AORTOGRAM;  Surgeon: Serafina Mitchell, MD;  Location: [place] CV LAB;  Service: Cardiovascular;  Laterality: N/A;  . ANTERIOR CERVICAL DECOMP/DISCECTOMY FUSION    . APPLICATION OF WOUND VAC Right 08/14/2017   Procedure: APPLICATION OF PREVENA INCISIONAL WOUND VAC RIGHT GROIN;  Surgeon: Serafina Mitchell, MD;  Location: [facility] OR;  Service: Vascular;  Laterality: Right;  . AV FISTULA PLACEMENT Left 09/04/2017   Procedure: ARTERIOVENOUS (AV) FISTULA CREATION LEFT UPPER ARM;  Surgeon: Serafina Mitchell, MD;  Location: [place];  Service: Vascular;  Laterality: Left;  . BACK SURGERY    . BASCILIC VEIN TRANSPOSITION Left 11/20/2017   Procedure: SECOND STAGE BASILIC VEIN TRANSPOSITION LEFT ARM;  Surgeon: Serafina Mitchell, MD;  Location: [facility] OR;  Service: Vascular;  Laterality: Left;  . BIOPSY  12/17/2016   Procedure: BIOPSY;  Surgeon: Daneil Dolin, MD;  [facility];  Service: Gastroenterology;;  gastric colon  . BIOPSY  04/29/2017   Procedure: BIOPSY;  Surgeon: Daneil Dolin, MD;  [facility];  Service: Endoscopy;;  duodenal biopsies  . BUNIONECTOMY Bilateral   . CENTRAL VENOUS CATHETER INSERTION Right 08/29/2019   Procedure: Attempted Insertion Central Line Adult;  Surgeon:  Jesusita Oka, MD;  Location: [place];  Service: General;  Laterality: Right;  . COLONOSCOPY  2008   Dr. Oneida Alar: rare sigmoid colon diverticulosis, internal hemorrhoids.   . COLONOSCOPY WITH PROPOFOL N/A 12/17/2016   dense left-sided diverticulosis, right colon ulcers s/p biopsy query occult NSAID use vs transient ischemia, not consistent with IBD. CMV stains negative.   Marland Kitchen ENDARTERECTOMY FEMORAL Right 08/14/2017   Procedure: RIGHT ILLIO-FEMORAL ENDARTERECTOMY;  Surgeon: Serafina Mitchell, MD;  Location: [facility] OR;  Service: Vascular;  Laterality: Right;  . ESOPHAGEAL DILATION  12/17/2016   EGD with mild Schatzki's ring s/p dilatation, small hiatal hernia, erosive gastropathy (negative H.pylori gastritis)  . ESOPHAGOGASTRODUODENOSCOPY  2008   Dr. Oneida Alar: normal esophagus without Barrett's, antritis and duodenitis, path with H.pylori gastritis  . ESOPHAGOGASTRODUODENOSCOPY (EGD) WITH PROPOFOL N/A 12/17/2016   Procedure: ESOPHAGOGASTRODUODENOSCOPY (EGD) WITH PROPOFOL;  Surgeon: Daneil Dolin, MD;  [facility];  Service: Gastroenterology;  Laterality: N/A;  . ESOPHAGOGASTRODUODENOSCOPY (EGD) WITH PROPOFOL N/A 04/29/2017   Patchy erythema of gastric mucosa diffusely, extensive inflammatory changes in duodenum, geographic ulceration and mucosal edema present, encroaching somewhat on the lumen yet still widely patent, distal second portion of duodenum appeared abnormal, path with peptic duodenitis with ulceration  . GROIN DEBRIDEMENT Right 10/14/2017   Procedure: RIGHT GROIN AND RIGHT LOWER QUADRANT ABDOMEN DEBRIDEMENT WITH PLACEMENT OF ANTIBIOTIC BEADS;  Surgeon: Serafina Mitchell, MD;  Location: [place];  Service: Vascular;  Laterality: Right;  . HEMATOMA EVACUATION Left 12/06/2017   Procedure: EVACUATION HEMATOMA;  Surgeon: Waynetta Sandy, MD;  Location: [place];  Service: Vascular;  Laterality: Left;  . HERNIA REPAIR  1610   umbilical  . I & D EXTREMITY Right 09/21/2017   Procedure: IRRIGATION  AND DEBRIDEMENT GROIN;  Surgeon: Elam Dutch, MD;  Location: [place];  Service: Vascular;  Laterality: Right;  . I & D EXTREMITY Left 11/23/2017   Procedure: Evacuation Hematoma LEFT UPPER ARM GRAFT;  Surgeon: Elam Dutch, MD;  Location: [place];  Service: Vascular;  Laterality: Left;  . INGUINAL HERNIA REPAIR Right 02/2017   [place]  . INSERTION OF DIALYSIS CATHETER Right 09/04/2017   Procedure: INSERTION OF TUNNELED  DIALYSIS CATHETER - RIGHT INTERNAL JUGULAR PLACEMENT;  Surgeon: Serafina Mitchell, MD;  Location: [place];  Service: Vascular;  Laterality: Right;  . INSERTION OF DIALYSIS CATHETER Right 07/26/2018   Procedure: INSERTION OF DIALYSIS CATHETER RIGHT INTERNAL JUGULAR;  Surgeon: Marty Heck, MD;  Location: [place];  Service: Vascular;  Laterality: Right;  . INSERTION OF ILIAC STENT Right 08/14/2017   Procedure: INSERTION OF RIGHT COMMON ILIAC STENT 62mm x 77mm x 130cm INSERTION OF RIGHT EXTERNAL ILIAC STENT 67mm x 80mm x 130cm INSERTION OF SUPERFICIAL FERMORAL ARTERY STENT 16mm x 53mm x 130cm;  Surgeon: Serafina Mitchell, MD;  Location: [place];  Service: Vascular;  Laterality: Right;  . IR FLUORO GUIDE CV LINE LEFT  08/31/2019  . IR FLUORO  GUIDE CV LINE RIGHT  08/31/2017  . IR FLUORO GUIDE CV LINE RIGHT  08/30/2019  . IR REMOVAL TUN CV CATH W/O FL  05/28/2018  . IR REMOVAL TUN CV CATH W/O FL  03/07/2019  . IR US GUIDE VASC ACCESS LEFT  08/31/2019  . IR US GUIDE VASC ACCESS RIGHT  08/31/2017  . IR US GUIDE VASC ACCESS RIGHT  08/30/2019  . IRRIGATION AND DEBRIDEMENT ABDOMEN  09/05/2019   Procedure: Irrigation And Debridement Abdomen, Placement of retention sutures;  Surgeon: Donnie Mesa, MD;  Location: [place];  Service: General;;  . LAPAROTOMY N/A 08/29/2019   Procedure: EXPLORATORY LAPAROTOMY, REPAIR OF SEROSAL INJURY TIMES THREE;  Surgeon: Jesusita Oka, MD;  Location: [place];  Service: General;  Laterality: N/A;  . LAPAROTOMY N/A 09/05/2019   Procedure: EXPLORATORY LAPAROTOMY;   Surgeon: Donnie Mesa, MD;  Location: [place];  Service: General;  Laterality: N/A;  . LOWER EXTREMITY ANGIOGRAPHY Right 08/11/2017   Procedure: LOWER EXTREMITY ANGIOGRAPHY;  Surgeon: Serafina Mitchell, MD;  Location: [place] CV LAB;  Service: Cardiovascular;  Laterality: Right;  . LYSIS OF ADHESION  08/29/2019   Procedure: Lysis Of Adhesion;  Surgeon: Jesusita Oka, MD;  Location: [place];  Service: General;;  . PATCH ANGIOPLASTY Right 08/14/2017   Procedure: PATCH ANGIOPLASTY USING HEMASHIELD PATCH 0.3IN Lillie Columbia;  Surgeon: Serafina Mitchell, MD;  Location: [place] OR;  Service: Vascular;  Laterality: Right;  . PERIPHERAL VASCULAR CATHETERIZATION N/A 09/20/2014   Procedure: Abdominal Aortogram;  Surgeon: Serafina Mitchell, MD;  Location: [place] CV LAB;  Service: Cardiovascular;  Laterality: N/A;  . POSTERIOR FUSION LUMBAR SPINE    . TEE WITHOUT CARDIOVERSION N/A 01/12/2018   Procedure: TRANSESOPHAGEAL ECHOCARDIOGRAM (TEE);  Surgeon: Lelon Perla, MD;  Location: [facility] ENDOSCOPY;  Service: Cardiovascular;  Laterality: N/A;     Home Medications:  Prior to Admission medications   Medication Sig Start Date End Date Taking? Authorizing Provider  albuterol (PROVENTIL HFA;VENTOLIN HFA) 108 (90 Base) MCG/ACT inhaler Inhale 2 puffs into the lungs every 6 (six) hours as needed for wheezing or shortness of breath.    [provider]  albuterol (PROVENTIL) (2.5 MG/3ML) 0.083% nebulizer solution Inhale 3 mLs into the lungs 3 (three) times daily as needed for shortness of breath or wheezing. 06/23/18   [provider]  apixaban (ELIQUIS) 5 MG TABS tablet Take 1 tablet (5 mg total) by mouth 2 (two) times daily. 09/11/19   Mercy Riding, MD  atorvastatin (LIPITOR) 80 MG tablet Take 80 mg by mouth daily. 04/25/19   [provider]  BREO ELLIPTA 200-25 MCG/INH AEPB Inhale 1 puff into the lungs daily as needed (for respiratory issues.).  07/27/17   [provider]  calcium acetate  (PHOSLO) 667 MG capsule Take 667 mg by mouth 3 (three) times daily with meals. 06/29/18   [provider]  Darbepoetin Alfa (ARANESP) 60 MCG/0.3ML SOSY injection Inject 0.3 mLs (60 mcg total) into the vein every Wednesday with hemodialysis. 09/14/19   Mercy Riding, MD  diltiazem (CARDIZEM CD) 180 MG 24 hr capsule Take 1 capsule (180 mg total) by mouth daily. 09/28/19   Mercy Riding, MD  docusate sodium (COLACE) 100 MG capsule Take 1 capsule (100 mg total) by mouth daily. 09/11/19   Mercy Riding, MD  gabapentin (NEURONTIN) 300 MG capsule Take 1 capsule (300 mg total) by mouth at bedtime. 09/27/19   Mercy Riding, MD  metoprolol tartrate (LOPRESSOR) 25  MG tablet Take 1 tablet (25 mg total) by mouth 2 (two) times daily. 09/11/19   Mercy Riding, MD  multivitamin (RENA-VIT) TABS tablet Take 1 tablet by mouth at bedtime. 09/11/19   Mercy Riding, MD  ondansetron (ZOFRAN) 4 MG tablet Take 1 tablet (4 mg total) by mouth every 8 (eight) hours as needed for nausea. 09/11/19   Mercy Riding, MD  oxyCODONE-acetaminophen (PERCOCET) 10-325 MG tablet Take 1 tablet by mouth 5 (five) times daily as needed for pain.     [provider]  pantoprazole (PROTONIX) 40 MG tablet Take 1 tablet (40 mg total) by mouth 2 (two) times daily. Patient taking differently: Take 40 mg by mouth daily.  04/30/17 01/27/26  Manuella Ghazi, Pratik D, DO  senna-docusate (SENOKOT-S) 8.6-50 MG tablet Take 1 tablet by mouth 2 (two) times daily as needed for moderate constipation. 09/11/19   Mercy Riding, MD  varenicline (CHANTIX) 0.5 MG tablet Take 0.5 mg by mouth 2 (two) times daily.    [provider]    Inpatient Medications: Scheduled Meds: . apixaban  5 mg Oral BID  . atorvastatin  80 mg Oral Daily  . Chlorhexidine Gluconate Cloth  6 each Topical Q0600  . gabapentin  300 mg Oral QHS  . gentamicin cream  1 application Topical Daily  . midodrine  10 mg Oral TID WC   Continuous Infusions: . dialysis solution 1.5%  low-MG/low-CA    . phenylephrine (NEO-SYNEPHRINE) Adult infusion Stopped (10/01/19 1210)   PRN Meds: albuterol, docusate sodium, fluticasone furoate-vilanterol, dianeal solution for CAPD/CCPD with heparin, oxyCODONE-acetaminophen, polyethylene glycol  Allergies:   No Known Allergies  Social History:   Social History   Socioeconomic History  . Marital status: Legally Separated    Spouse name: Not on file  . Number of children: 2  . Years of education: Not on file  . Highest education level: Not on file  Occupational History  . Occupation: DISABLED  Tobacco Use  . Smoking status: Current Every Day Smoker    Packs/day: 0.25    Years: 47.00    Pack years: 11.75    Types: Cigarettes    Last attempt to quit: 08/03/2017    Years since quitting: 2.1  . Smokeless tobacco: Never Used  Vaping Use  . Vaping Use: Never used  Substance and Sexual Activity  . Alcohol use: Not Currently    Comment: 10/12/2017 alcohol free since 2017,  heavy drinker in the past  . Drug use: Not Currently    Comment: "<2003 whatever was around; nothing since"  . Sexual activity: Not Currently  Other Topics Concern  . Not on file  Social History Narrative   Disabled from Back since 2007   Social Determinants of Health   Financial Resource Strain:   . Difficulty of Paying Living Expenses:   Food Insecurity:   . Worried About Charity fundraiser in the Last Year:   . Arboriculturist in the Last Year:   Transportation Needs:   . Film/video editor (Medical):   Marland Kitchen Lack of Transportation (Non-Medical):   Physical Activity:   . Days of Exercise per Week:   . Minutes of Exercise per Session:   Stress:   . Feeling of Stress :   Social Connections:   . Frequency of Communication with Friends and Family:   . Frequency of Social Gatherings with Friends and Family:   . Attends Religious Services:   . Active Member of Clubs or  Organizations:   . Attends Archivist Meetings:   Marland Kitchen Marital Status:    Intimate Partner Violence:   . Fear of Current or Ex-Partner:   . Emotionally Abused:   Marland Kitchen Physically Abused:   . Sexually Abused:     Family History:    Family History  Problem Relation Age of Onset  . Alcoholism Mother   . Heart disease Father        Massive heart attack  . Heart attack Father   . Atrial fibrillation Father   . Colon cancer Father   . Colon cancer Maternal Grandfather 51  . Alcoholism Maternal Grandfather   . Renal cancer Cousin   . Ovarian cancer Sister      ROS:  Please see the history of present illness.   All other ROS reviewed and negative.     Physical Exam/Data:   Vitals:   10/01/19 1300 10/01/19 1315 10/01/19 1330 10/01/19 1345  BP: (!) 82/64 (!) 65/53 (!) 70/48 93/62  Pulse: 100  73 68  Resp: (!) 23 16 16 12   Temp:      TempSrc:      SpO2: 100%  100% 99%    Intake/Output Summary (Last 24 hours) at 10/01/2019 1447 Last data filed at 10/01/2019 1300 Gross per 24 hour  Intake 2067.97 ml  Output 200 ml  Net 1867.97 ml   Last 3 Weights 09/26/2019 09/26/2019 09/26/2019  Weight (lbs) 199 lb 4.7 oz 206 lb 5.6 oz 200 lb 13.4 oz  Weight (kg) 90.4 kg 93.6 kg 91.1 kg     There is no height or weight on file to calculate BMI.  General:  Well nourished, well developed, in no acute distress HEENT: normal Lymph: no adenopathy Neck: no JVD Endocrine:  No thryomegaly Vascular: No carotid bruits; FA pulses 2+ bilaterally without bruits  Cardiac:  normal S1, S2; tachycardic, irregular; no murmur  Lungs:  clear to auscultation bilaterally, no wheezing, rhonchi or rales  Abd: Abdominal dressings in place, mildly tender Ext: no edema Musculoskeletal:  No deformities, BUE and BLE strength normal and equal Skin: warm and dry  Neuro:  CNs 2-12 intact, no focal abnormalities noted Psych:  Normal affect   EKG:  The EKG was personally reviewed and demonstrates:  None new Telemetry:  Telemetry was personally reviewed and demonstrates:  AF with  RVR  Relevant CV Studies: TTE 05/27/19  Laboratory Data:  High Sensitivity Troponin:   Recent Labs  Lab 09/20/19 1306 09/20/19 1505  TROPONINIHS 29* 28*     Chemistry Recent Labs  Lab 09/26/19 0442 09/27/19 0621 10/01/19 0643  NA 132* 136 134*  K 5.4* 5.0 5.4*  CL 96* 98 98  CO2 25 26 22   GLUCOSE 101* 137* 84  BUN 46* 29* 38*  CREATININE 5.84* 4.32* 5.74*  CALCIUM 7.6* 7.5* 6.6*  GFRNONAA 9* 14* 10*  GFRAA 11* 16* 11*  ANIONGAP 11 12 14     Recent Labs  Lab 09/26/19 0442 09/27/19 0621 10/01/19 0643  PROT  --   --  5.0*  ALBUMIN 1.5* 1.4* 1.4*  AST  --   --  52*  ALT  --   --  21  ALKPHOS  --   --  151*  BILITOT  --   --  0.9   Hematology Recent Labs  Lab 09/26/19 0442 09/27/19 0621 09/27/19 0941  WBC 15.3*  --  10.9*  RBC 2.85*  --  2.63*  HGB 8.7* 8.4* 8.1*  HCT 28.4*  26.9* 26.0*  MCV 99.6  --  98.9  MCH 30.5  --  30.8  MCHC 30.6  --  31.2  RDW 16.5*  --  16.5*  PLT 334  --  306   BNP Recent Labs  Lab 10/01/19 0643  BNP 3,857.9*    DDimer No results for input(s): DDIMER in the last 168 hours.   Radiology/Studies:  DG CHEST PORT 1 VIEW  Result Date: 10/01/2019 CLINICAL DATA:  Shortness of breath EXAM: PORTABLE CHEST 1 VIEW COMPARISON:  September 30, 2019 FINDINGS: Prominent skin fold is seen over the right lateral upper lung. No pneumothorax. The left-sided dialysis catheter is stable. Increasing opacities in the bases. Stable cardiomegaly. No other acute abnormalities. IMPRESSION: 1. Increasing opacities in the bases bilaterally may represent layering effusion with underlying atelectasis. However, infiltrate such as pneumonia or aspiration are not excluded and could have the same appearance. 2. No other abnormalities. Electronically Signed   By: Dorise Bullion III M.D   On: 10/01/2019 10:34    Assessment and Plan:   1. Atrial fibrillation with rapid ventricular response: It is unclear to me at this time if he is in permanent atrial fibrillation  or persistent.  He did have an ECG last September that was sinus rhythm.  It is also unclear to me as to why he went into rapid atrial fibrillation, unless it was due to feeling poorly at dialysis and having some issue with that.  His blood pressure is low as he got Cardizem which caused hypotension.  His atrial fibrillation rates are 110-130.  We Nanda Bittick work to get some records from his primary cardiologist at [facility], Dr. Denman George for further clarification of his atrial fibrillation.  Would continue his anticoagulation for a CHA2DS2-VASc of likely 2.  Should he require rate control, would start him on IV amiodarone. 2. End-stage renal disease: Patient currently on dialysis.  Nephrology has been consulted.     For questions or updates, please contact [place] Please consult [email or web address] for contact info under    Signed, Naturi Alarid Meredith Leeds, MD  10/01/2019 2:47 PM

## 2019-10-01 NOTE — Progress Notes (Signed)
CRITICAL VALUE ALERT  Critical Value:  Lactic Acid 3.6  Date & Time Notied:  10/01/19 @ 820  Provider Notified: Dr. Elsworth Soho  Orders Received/Actions taken: cont. monitoring   Lactate from 6/18 @ 1445 was 6.6. Lab drew from [facility] ED.

## 2019-10-01 NOTE — Progress Notes (Signed)
eLink Physician-Brief Progress Note Patient Name: Aaron [name] DOB: [birth year] MRN: [id number]   Date of Service  10/01/2019  HPI/Events of Note  Patient transferred from [facility] with Afib with RVR , and ESRD on chronic dialysis,  En-route to [facility] he required Phenylephrine infusion and so he was changed from a step down admission by [facility] to a PCCM admission.  eICU Interventions  New Patient Evaluation completed.        Kerry Kass Quindarrius [place] 10/01/2019, 6:08 AM

## 2019-10-01 NOTE — Consult Note (Signed)
[place] Nurse Consult Note: Reason for Consult: Abdominal wound and left toe wound. Abdominal wound followed by [facility] during last admission and last seen by [facility] PA K. Wynetta Emery and Dr. Donne Hazel on 09/26/19. Photo in EMR from 09/26/19. Toe wound is followed by [facility]. Wound type: surgical (abdominal), trauma (toe) Pressure Injury POA: N/A Measurement: Per Nursing flow sheet Wound bed: Abdominal wound is pink moist, moderate amount serous drainage Drainage (amount, consistency, odor) See abvoe Periwound: intact with evidence of healing Dressing procedure/placement/frequency: Will continue with the POC implemented by [facility] for twice daily NS gauze dressings to abdominal wound.  Toe wound has been treated with xeroform gauze and is healing.I will continue that wound care via the Orders.  Recommend notification of [facility] for continuation of follow-up while in house.  If you agree, please order/arrange consultation.  [place] nursing team will not follow, but will remain available to this patient, the nursing and medical teams.  Please re-consult if needed. Thanks, Maudie Flakes, MSN, RN, [place], Arther Abbott  Pager# [phone]

## 2019-10-01 NOTE — Progress Notes (Addendum)
Spoke with Dr Elsworth Soho and Janett Billow RN re midline order.  Janett Billow RN states needed for lab draws and neo.  Notified midlines are not placed for labs nor are vasopressors approved via midline.  Also require nephrology to approve midline placement as well as PICC placement due to renal hx.  Recommend PICC placement or CVC placement for this pt needs.  Dr Elsworth Soho states to utilize current PIV for needs at this time and he will contact nephrology.

## 2019-10-01 NOTE — H&P (Addendum)
NAME:  Aaron [name], MRN:  [id number], DOB:  June 02, 1955, LOS: 0 ADMISSION DATE:  10/01/2019, CONSULTATION DATE:  10/01/19 REFERRING MD:  Mercer Pod, CHIEF COMPLAINT:  hypotension   Brief History   64 y/o M with ESRD and Atrial fibrillation who became light-headed at dialysis on 6/18 and had a syncopal episode with reported respiratory arrest .  He went to [facility] ED and was transferred to [facility] for dialysis.  En route, was started on cardizem gtt and BP dropped.  PCCM consulted for hypotension.   History of present illness   63 y.o. M with PMH of ESRD and permanent Atrial fibrillation on Eliquis who was recently admitted for small bowel obstruction and is status post ex lap with retention sutures who was at dialysis on 6/18 when he became dizzy.  Per report he had a syncopal episode and respiratory arrest and was sent to [facility] emergency department.  His lactic acid was elevated to 6.6, no significant WBC or source of infection.  He reports continued abdominal pain that is stable since his surgery with one episode of diarrhea yesterday.  Pt states that he usually does peritoneal dialysis, but has had to do HD since his abdominal surgery.  He denies fever, chills, nausea, vomiting, chest pain or palpitations or any other infectious symptoms.   He was to be transferred to [name] to the hospitalist service when he developed RVR en route and was started on cardizem.  His BP then dropped and he was started on Neo, so was transferred to [facility].   Past Medical History   has a past medical history of Anxiety, Arthritis, Atrial fibrillation ([place]), CAD (coronary artery disease), Chronic back pain, COPD (chronic obstructive pulmonary disease) ([place]), Diverticulitis, Esophageal reflux, ESRD (end stage renal disease) on dialysis [facility]), Essential hypertension, Hepatitis C, History of kidney stones, History of syncope, Hyperlipidemia, Jerking (09/23/2014), Myocardial infarction ([place]) (02/2017), Non-compliant  behavior, PAT (paroxysmal atrial tachycardia) ([place]), Peripheral arterial disease ([place]), Pneumonia (1961), Polycystic kidney, unspecified type, Syncope (09/2014), and SYNCOPE (05/07/2010).   Significant Hospital Events   6/18 Presented to [facility] 6/19 Transfer to [facility]  Consults:  Cardiology Nephrology  Procedures:       Significant Diagnostic Tests:    Micro Data:  6/18 Sars-Cov-2>>negative  Antimicrobials:     Interim history/subjective:  Pt arrived awake and alert in no distress on [place] oxygen  Objective   Blood pressure (!) 81/66, resp. rate 15, SpO2 100 %.       No intake or output data in the 24 hours ending 10/01/19 0615 There were no vitals filed for this visit.   General:  Elderly, chronically ill-appearing M in no acute distress HEENT: MM pink/moist Neuro: awake, alert and oriented CV: s1s2 irregular, HR 80's, , no m/r/g PULM:  CTAB GI: soft, bsx4 active, large abdominal binder in place Extremities: warm/dry, no edema  Skin: no rashes or lesions   Resolved Hospital Problem list     Assessment & Plan:    Syncopal Episode with hypotension Unclear etiology, possibly arrythmia, per notes HR 140-150 initially.  Pt denies missing any medication. No chest pain or hypoxia to suggest PE and patient is anti-coagulated. -currently rate controlled off Cardizem -cardiology has evaluated, appreciate recommendations -Echo -consider head CT and repeat labs -Continue peripheral neosynephrine to maintain MAP >65 -500cc bolus    Atrial Fibrillation Rate controlled with Metoprolol and cardizem at home -Per report, pt had a significant BP drop when Cardizem gtt initiated, now stopped and HR 80's -  Appreciate Cardiology recommendations -Obtain echo -continue Eliquis    Lactic acidosis, recent SBO s/p ex lap bowel resection  -repeat Lactic is pending -if worsening abdominal pain consider CT abd/pelvis   ESRD Per pt was doing peritoneal dialysis until his recent  surgery, now getting HD through [facility] M/W/F, just missed Friday while at [facility] -does not appear volume overloaded, follow labs, will need nephrology consult     Best practice:  Diet: Soft Pain/Anxiety/Delirium protocol (if indicated): Percocet VAP protocol (if indicated): n/a DVT prophylaxis: eliquis GI prophylaxis:  Glucose control:  Mobility: bed rest  Code Status: Full code, but pt states he would not want to stay on life support for any extended amount of time Family Communication:  Disposition: ICU  Labs   CBC: Recent Labs  Lab 09/26/19 0442 09/27/19 0621 09/27/19 0941  WBC 15.3*  --  10.9*  HGB 8.7* 8.4* 8.1*  HCT 28.4* 26.9* 26.0*  MCV 99.6  --  98.9  PLT 334  --  462    Basic Metabolic Panel: Recent Labs  Lab 09/26/19 0442 09/27/19 0621  NA 132* 136  K 5.4* 5.0  CL 96* 98  CO2 25 26  GLUCOSE 101* 137*  BUN 46* 29*  CREATININE 5.84* 4.32*  CALCIUM 7.6* 7.5*  MG 1.7 1.6*  PHOS 2.9 2.6   GFR: Estimated Creatinine Clearance: 21.5 mL/min (A) (by C-G formula based on SCr of 4.32 mg/dL (H)). Recent Labs  Lab 09/26/19 0442 09/27/19 0941  WBC 15.3* 10.9*    Liver Function Tests: Recent Labs  Lab 09/26/19 0442 09/27/19 0621  ALBUMIN 1.5* 1.4*   No results for input(s): LIPASE, AMYLASE in the last 168 hours. No results for input(s): AMMONIA in the last 168 hours.  ABG    Component Value Date/Time   PHART 7.469 (H) 02/01/2018 0550   PCO2ART 23.7 (L) 02/01/2018 0550   PO2ART 105.0 02/01/2018 0550   HCO3 20.2 02/01/2018 0550   TCO2 24 01/31/2018 1742   ACIDBASEDEF 6.0 (H) 02/01/2018 0550   O2SAT 98.2 02/01/2018 0550     Coagulation Profile: No results for input(s): INR, PROTIME in the last 168 hours.  Cardiac Enzymes: No results for input(s): CKTOTAL, CKMB, CKMBINDEX, TROPONINI in the last 168 hours.  HbA1C: Hgb A1c MFr Bld  Date/Time Value Ref Range Status  05/27/2019 03:20 PM 5.9 (H) 4.8 - 5.6 % Final    Comment:    (NOTE) Pre  diabetes:          5.7%-6.4% Diabetes:              >6.4% Glycemic control for   <7.0% adults with diabetes     CBG: Recent Labs  Lab 10/01/19 0602  GLUCAP 82    Review of Systems:   Negative except as noted in HPI  Past Medical History  He,  has a past medical history of Anxiety, Arthritis, Atrial fibrillation ([place]), CAD (coronary artery disease), Chronic back pain, COPD (chronic obstructive pulmonary disease) ([place]), Diverticulitis, Esophageal reflux, ESRD (end stage renal disease) on dialysis [facility]), Essential hypertension, Hepatitis C, History of kidney stones, History of syncope, Hyperlipidemia, Jerking (09/23/2014), Myocardial infarction ([place]) (02/2017), Non-compliant behavior, PAT (paroxysmal atrial tachycardia) ([place]), Peripheral arterial disease ([place]), Pneumonia (1961), Polycystic kidney, unspecified type, Syncope (09/2014), and SYNCOPE (05/07/2010).   Surgical History    Past Surgical History:  Procedure Laterality Date  . ABDOMINAL AORTOGRAM N/A 08/11/2017   Procedure: ABDOMINAL AORTOGRAM;  Surgeon: Serafina Mitchell, MD;  Location: [place] [facility];  Service: Cardiovascular;  Laterality: N/A;  . ANTERIOR CERVICAL DECOMP/DISCECTOMY FUSION    . APPLICATION OF WOUND VAC Right 08/14/2017   Procedure: APPLICATION OF PREVENA INCISIONAL WOUND VAC RIGHT GROIN;  Surgeon: Serafina Mitchell, MD;  Location: [facility];  Service: Vascular;  Laterality: Right;  . AV FISTULA PLACEMENT Left 09/04/2017   Procedure: ARTERIOVENOUS (AV) FISTULA CREATION LEFT UPPER ARM;  Surgeon: Serafina Mitchell, MD;  Location: [place];  Service: Vascular;  Laterality: Left;  . BACK SURGERY    . BASCILIC VEIN TRANSPOSITION Left 11/20/2017   Procedure: SECOND STAGE BASILIC VEIN TRANSPOSITION LEFT ARM;  Surgeon: Serafina Mitchell, MD;  Location: [place];  Service: Vascular;  Laterality: Left;  . BIOPSY  12/17/2016   Procedure: BIOPSY;  Surgeon: Daneil Dolin, MD;  [facility];  Service: Gastroenterology;;   gastric colon  . BIOPSY  04/29/2017   Procedure: BIOPSY;  Surgeon: Daneil Dolin, MD;  [facility];  Service: Endoscopy;;  duodenal biopsies  . BUNIONECTOMY Bilateral   . CENTRAL VENOUS CATHETER INSERTION Right 08/29/2019   Procedure: Attempted Insertion Central Line Adult;  Surgeon: Jesusita Oka, MD;  Location: [place];  Service: General;  Laterality: Right;  . COLONOSCOPY  2008   Dr. Oneida Alar: rare sigmoid colon diverticulosis, internal hemorrhoids.   . COLONOSCOPY WITH PROPOFOL N/A 12/17/2016   dense left-sided diverticulosis, right colon ulcers s/p biopsy query occult NSAID use vs transient ischemia, not consistent with IBD. CMV stains negative.   Marland Kitchen ENDARTERECTOMY FEMORAL Right 08/14/2017   Procedure: RIGHT ILLIO-FEMORAL ENDARTERECTOMY;  Surgeon: Serafina Mitchell, MD;  Location[facility];  Service: Vascular;  Laterality: Right;  . ESOPHAGEAL DILATION  12/17/2016   EGD with mild Schatzki's ring s/p dilatation, small hiatal hernia, erosive gastropathy (negative H.pylori gastritis)  . ESOPHAGOGASTRODUODENOSCOPY  2008   Dr. Oneida Alar: normal esophagus without Barrett's, antritis and duodenitis, path with H.pylori gastritis  . ESOPHAGOGASTRODUODENOSCOPY (EGD) WITH PROPOFOL N/A 12/17/2016   Procedure: ESOPHAGOGASTRODUODENOSCOPY (EGD) WITH PROPOFOL;  Surgeon: Daneil Dolin, MD;  [facility];  Service: Gastroenterology;  Laterality: N/A;  . ESOPHAGOGASTRODUODENOSCOPY (EGD) WITH PROPOFOL N/A 04/29/2017   Patchy erythema of gastric mucosa diffusely, extensive inflammatory changes in duodenum, geographic ulceration and mucosal edema present, encroaching somewhat on the lumen yet still widely patent, distal second portion of duodenum appeared abnormal, path with peptic duodenitis with ulceration  . GROIN DEBRIDEMENT Right 10/14/2017   Procedure: RIGHT GROIN AND RIGHT LOWER QUADRANT ABDOMEN DEBRIDEMENT WITH PLACEMENT OF ANTIBIOTIC BEADS;  Surgeon: Serafina Mitchell, MD;  Location: [place];   Service: Vascular;  Laterality: Right;  . HEMATOMA EVACUATION Left 12/06/2017   Procedure: EVACUATION HEMATOMA;  Surgeon: Waynetta Sandy, MD;  Location: [place];  Service: Vascular;  Laterality: Left;  . HERNIA REPAIR  6948   umbilical  . I & D EXTREMITY Right 09/21/2017   Procedure: IRRIGATION AND DEBRIDEMENT GROIN;  Surgeon: Elam Dutch, MD;  Location: [place];  Service: Vascular;  Laterality: Right;  . I & D EXTREMITY Left 11/23/2017   Procedure: Evacuation Hematoma LEFT UPPER ARM GRAFT;  Surgeon: Elam Dutch, MD;  Location: [place];  Service: Vascular;  Laterality: Left;  . INGUINAL HERNIA REPAIR Right 02/2017   [place]  . INSERTION OF DIALYSIS CATHETER Right 09/04/2017   Procedure: INSERTION OF TUNNELED  DIALYSIS CATHETER - RIGHT INTERNAL JUGULAR PLACEMENT;  Surgeon: Serafina Mitchell, MD;  Location: [place];  Service: Vascular;  Laterality: Right;  .  INSERTION OF DIALYSIS CATHETER Right 07/26/2018   Procedure: INSERTION OF DIALYSIS CATHETER RIGHT INTERNAL JUGULAR;  Surgeon: Marty Heck, MD;  Location: [place];  Service: Vascular;  Laterality: Right;  . INSERTION OF ILIAC STENT Right 08/14/2017   Procedure: INSERTION OF RIGHT COMMON ILIAC STENT 68mm x 29mm x 130cm INSERTION OF RIGHT EXTERNAL ILIAC STENT 49mm x 39mm x 130cm INSERTION OF SUPERFICIAL FERMORAL ARTERY STENT 69mm x 40mm x 130cm;  Surgeon: Serafina Mitchell, MD;  Location: [place];  Service: Vascular;  Laterality: Right;  . IR FLUORO GUIDE CV LINE LEFT  08/31/2019  . IR FLUORO GUIDE CV LINE RIGHT  08/31/2017  . IR FLUORO GUIDE CV LINE RIGHT  08/30/2019  . IR REMOVAL TUN CV CATH W/O FL  05/28/2018  . IR REMOVAL TUN CV CATH W/O FL  03/07/2019  . IR US GUIDE VASC ACCESS LEFT  08/31/2019  . IR US GUIDE VASC ACCESS RIGHT  08/31/2017  . IR US GUIDE VASC ACCESS RIGHT  08/30/2019  . IRRIGATION AND DEBRIDEMENT ABDOMEN  09/05/2019   Procedure: Irrigation And Debridement Abdomen, Placement of retention sutures;  Surgeon: Donnie Mesa, MD;  Location: [place];  Service: General;;  . LAPAROTOMY N/A 08/29/2019   Procedure: EXPLORATORY LAPAROTOMY, REPAIR OF SEROSAL INJURY TIMES THREE;  Surgeon: Jesusita Oka, MD;  Location: [place];  Service: General;  Laterality: N/A;  . LAPAROTOMY N/A 09/05/2019   Procedure: EXPLORATORY LAPAROTOMY;  Surgeon: Donnie Mesa, MD;  Location: [place];  Service: General;  Laterality: N/A;  . LOWER EXTREMITY ANGIOGRAPHY Right 08/11/2017   Procedure: LOWER EXTREMITY ANGIOGRAPHY;  Surgeon: Serafina Mitchell, MD;  Location: [place] [facility];  Service: Cardiovascular;  Laterality: Right;  . LYSIS OF ADHESION  08/29/2019   Procedure: Lysis Of Adhesion;  Surgeon: Jesusita Oka, MD;  Location: [place];  Service: General;;  . PATCH ANGIOPLASTY Right 08/14/2017   Procedure: PATCH ANGIOPLASTY USING HEMASHIELD PATCH 0.3IN Lillie Columbia;  Surgeon: Serafina Mitchell, MD;  Location: [place] [facility];  Service: Vascular;  Laterality: Right;  . PERIPHERAL VASCULAR CATHETERIZATION N/A 09/20/2014   Procedure: Abdominal Aortogram;  Surgeon: Serafina Mitchell, MD;  Location: [place] [facility];  Service: Cardiovascular;  Laterality: N/A;  . POSTERIOR FUSION LUMBAR SPINE    . TEE WITHOUT CARDIOVERSION N/A 01/12/2018   Procedure: TRANSESOPHAGEAL ECHOCARDIOGRAM (TEE);  Surgeon: Lelon Perla, MD;  Location: [facility] ENDOSCOPY;  Service: Cardiovascular;  Laterality: N/A;     Social History   reports that he has been smoking cigarettes. He has a 11.75 pack-year smoking history. He has never used smokeless tobacco. He reports previous alcohol use. He reports previous drug use.   Family History   His family history includes Alcoholism in his maternal grandfather and mother; Atrial fibrillation in his father; Colon cancer in his father; Colon cancer (age of onset: 100) in his maternal grandfather; Heart attack in his father; Heart disease in his father; Ovarian cancer in his sister; Renal cancer in his cousin.   Allergies No Known Allergies   Home  Medications  Prior to Admission medications   Medication Sig Start Date End Date Taking? Authorizing Provider  albuterol (PROVENTIL HFA;VENTOLIN HFA) 108 (90 Base) MCG/ACT inhaler Inhale 2 puffs into the lungs every 6 (six) hours as needed for wheezing or shortness of breath.    [provider]  albuterol (PROVENTIL) (2.5 MG/3ML) 0.083% nebulizer solution Inhale 3 mLs into the lungs 3 (three) times daily as needed for shortness of breath  or wheezing. 06/23/18   [provider]  apixaban (ELIQUIS) 5 MG TABS tablet Take 1 tablet (5 mg total) by mouth 2 (two) times daily. 09/11/19   Mercy Riding, MD  atorvastatin (LIPITOR) 80 MG tablet Take 80 mg by mouth daily. 04/25/19   [provider]  BREO ELLIPTA 200-25 MCG/INH AEPB Inhale 1 puff into the lungs daily as needed (for respiratory issues.).  07/27/17   [provider]  calcium acetate (PHOSLO) 667 MG capsule Take 667 mg by mouth 3 (three) times daily with meals. 06/29/18   [provider]  Darbepoetin Alfa (ARANESP) 60 MCG/0.3ML SOSY injection Inject 0.3 mLs (60 mcg total) into the vein every Wednesday with hemodialysis. 09/14/19   Mercy Riding, MD  diltiazem (CARDIZEM CD) 180 MG 24 hr capsule Take 1 capsule (180 mg total) by mouth daily. 09/28/19   Mercy Riding, MD  docusate sodium (COLACE) 100 MG capsule Take 1 capsule (100 mg total) by mouth daily. 09/11/19   Mercy Riding, MD  gabapentin (NEURONTIN) 300 MG capsule Take 1 capsule (300 mg total) by mouth at bedtime. 09/27/19   Mercy Riding, MD  metoprolol tartrate (LOPRESSOR) 25 MG tablet Take 1 tablet (25 mg total) by mouth 2 (two) times daily. 09/11/19   Mercy Riding, MD  multivitamin (RENA-VIT) TABS tablet Take 1 tablet by mouth at bedtime. 09/11/19   Mercy Riding, MD  ondansetron (ZOFRAN) 4 MG tablet Take 1 tablet (4 mg total) by mouth every 8 (eight) hours as needed for nausea. 09/11/19   Mercy Riding, MD  oxyCODONE-acetaminophen (PERCOCET) 10-325 MG  tablet Take 1 tablet by mouth 5 (five) times daily as needed for pain.     [provider]  pantoprazole (PROTONIX) 40 MG tablet Take 1 tablet (40 mg total) by mouth 2 (two) times daily. Patient taking differently: Take 40 mg by mouth daily.  04/30/17 01/27/26  Manuella Ghazi, Pratik D, DO  senna-docusate (SENOKOT-S) 8.6-50 MG tablet Take 1 tablet by mouth 2 (two) times daily as needed for moderate constipation. 09/11/19   Mercy Riding, MD  varenicline (CHANTIX) 0.5 MG tablet Take 0.5 mg by mouth 2 (two) times daily.    [provider]     Critical care time: 45 minutes    CRITICAL CARE Performed by: Otilio Carpen Fable Huisman   Total critical care time: 45 minutes  Critical care time was exclusive of separately billable procedures and treating other patients.  Critical care was necessary to treat or prevent imminent or life-threatening deterioration.  Critical care was time spent personally by me on the following activities: development of treatment plan with patient and/or surrogate as well as nursing, discussions with consultants, evaluation of patient's response to treatment, examination of patient, obtaining history from patient or surrogate, ordering and performing treatments and interventions, ordering and review of laboratory studies, ordering and review of radiographic studies, pulse oximetry and re-evaluation of patient's condition.  Otilio Carpen Rogene Meth, PA-C

## 2019-10-01 NOTE — Consult Note (Addendum)
Renal Service Consult Note [facility]  ALTER MOSS 10/01/2019 Sol Blazing Requesting Physician:  Dr Elsworth Soho  Reason for Consult:  ESRD pt w/ hypotension r/o sepsis HPI: The patient is a 64 y.o. year-old w/ hx of PKD/ ESRD on temp HD (usual PD), atrial fib, HL, hep C, HTN, COPD, chronic back pain who presented to OSH yest after 1/2 of HD session c/b pt not feeling well w/ low BP's and passed out / syncopal episode, also possibly resp arrest. Was sent to [facility] ED. There he had afib/ RVR and low BP's started pressor x 1, +lactic acid and abd pain. Of note just prior to HD the PD nurses did  PD cath flush which hadn't been done for some time. Pt was given IV meds w/ pressors and then also needed afib /RVR meds overnight. Pt  transferred to [facility] ICU for admission here. Asked to see for ESRD.   K+ 5.4, pt denies SOB or cough, no CP.  +abd pain no different than usual.  Pt states his surgeon's have been following his abd wound and that he is planning on going back to PD but right now the surgeon's want his abd wound to heal over more first.  Getting MWF HD at [facility].     Admitted Feb 2021 w/ syncope, afib , ESRD, COPD rx'd w/ pred burst. BB and dilt changed to short-acting due to bradycardia, seen by cards no further w/u needed. Cath 2002 nonobstructive dz.  Admit May 10- 30, 2021 - w/ SOB, afib / RVR rx dilt IV gtt, then had emesis and SBO, went to OR on 5/17 for LOA. Have abd wound drainage and had repeat exlap 5/24 for washout and placement retention sutures. Got TPN for about 9 days here. Could not do PD due to abd surgery and was switched to HD w/ RIJ TDC on 5/19 by IR, HD MWF.   Admit 6/8- 6/15 , 2021 - a/c SOB and prod cough, missed HD x 4 due to ^'d incision pain in abdomen, +abd distension. Fell at home. K 7.5, afib /RVR on admit rx dilt gtt and HD. Abx for poss PNA. Sent to [facility] for concerns about abd wound dehiscence. Seen by gen surg wound was still healing and not  infected, no new rec's per gen surg. Pt got abx for PNA and dc'd home.      ROS  denies CP  no joint pain   no HA  no blurry vision  no rash  no diarrhea  no nausea/ vomiting   Past Medical History  Past Medical History:  Diagnosis Date  . Anxiety   . Arthritis    knees , back , shoulders (10/12/2017)  . Atrial fibrillation ([place])   . CAD (coronary artery disease)    Mild nonobstructive disease at cardiac catheterization 2002  . Chronic back pain    "back of my neck; down thru my legs" (10/12/2017)  . COPD (chronic obstructive pulmonary disease) ([place])   . Diverticulitis   . Esophageal reflux   . ESRD (end stage renal disease) on dialysis [facility])    "TTS; [facility]" (11/23/2017)  . Essential hypertension   . Hepatitis C    states he no longer has this  . History of kidney stones   . History of syncope   . Hyperlipidemia   . Jerking 09/23/2014  . Myocardial infarction ([place]) 02/2017   "light one" (10/12/2017)  . Non-compliant behavior    non compliant with diaylsis  per daughter  . PAT (paroxysmal atrial tachycardia) ([place])   . Peripheral arterial disease ([place])    Occluded left superficial femoral artery status post stent June 2016 - Dr. Trula Slade  . Pneumonia 1961  . Polycystic kidney, unspecified type   . Syncope 09/2014  . SYNCOPE 05/07/2010   Qualifier: Diagnosis of  By: Laurance Flatten RN, BSN, Anderson Malta     Past Surgical History  Past Surgical History:  Procedure Laterality Date  . ABDOMINAL AORTOGRAM N/A 08/11/2017   Procedure: ABDOMINAL AORTOGRAM;  Surgeon: Serafina Mitchell, MD;  Location: [place] CV [facility];  Service: Cardiovascular;  Laterality: N/A;  . ANTERIOR CERVICAL DECOMP/DISCECTOMY FUSION    . APPLICATION OF WOUND VAC Right 08/14/2017   Procedure: APPLICATION OF PREVENA INCISIONAL WOUND VAC RIGHT GROIN;  Surgeon: Serafina Mitchell, MD;  Location: [facility] OR;  Service: Vascular;  Laterality: Right;  . AV FISTULA PLACEMENT Left 09/04/2017   Procedure: ARTERIOVENOUS (AV) FISTULA CREATION  LEFT UPPER ARM;  Surgeon: Serafina Mitchell, MD;  Location: [place];  Service: Vascular;  Laterality: Left;  . BACK SURGERY    . BASCILIC VEIN TRANSPOSITION Left 11/20/2017   Procedure: SECOND STAGE BASILIC VEIN TRANSPOSITION LEFT ARM;  Surgeon: Serafina Mitchell, MD;  Location: [place];  Service: Vascular;  Laterality: Left;  . BIOPSY  12/17/2016   Procedure: BIOPSY;  Surgeon: Daneil Dolin, MD;  Location: [facility];  Service: Gastroenterology;;  gastric colon  . BIOPSY  04/29/2017   Procedure: BIOPSY;  Surgeon: Daneil Dolin, MD;  [facility];  Service: Endoscopy;;  duodenal biopsies  . BUNIONECTOMY Bilateral   . CENTRAL VENOUS CATHETER INSERTION Right 08/29/2019   Procedure: Attempted Insertion Central Line Adult;  Surgeon: Jesusita Oka, MD;  Location: [place];  Service: General;  Laterality: Right;  . COLONOSCOPY  2008   Dr. Oneida Alar: rare sigmoid colon diverticulosis, internal hemorrhoids.   . COLONOSCOPY WITH PROPOFOL N/A 12/17/2016   dense left-sided diverticulosis, right colon ulcers s/p biopsy query occult NSAID use vs transient ischemia, not consistent with IBD. CMV stains negative.   Marland Kitchen ENDARTERECTOMY FEMORAL Right 08/14/2017   Procedure: RIGHT ILLIO-FEMORAL ENDARTERECTOMY;  Surgeon: Serafina Mitchell, MD;  Location: [facility] OR;  Service: Vascular;  Laterality: Right;  . ESOPHAGEAL DILATION  12/17/2016   EGD with mild Schatzki's ring s/p dilatation, small hiatal hernia, erosive gastropathy (negative H.pylori gastritis)  . ESOPHAGOGASTRODUODENOSCOPY  2008   Dr. Oneida Alar: normal esophagus without Barrett's, antritis and duodenitis, path with H.pylori gastritis  . ESOPHAGOGASTRODUODENOSCOPY (EGD) WITH PROPOFOL N/A 12/17/2016   Procedure: ESOPHAGOGASTRODUODENOSCOPY (EGD) WITH PROPOFOL;  Surgeon: Daneil Dolin, MD;  [facility];  Service: Gastroenterology;  Laterality: N/A;  . ESOPHAGOGASTRODUODENOSCOPY (EGD) WITH PROPOFOL N/A 04/29/2017   Patchy erythema of gastric mucosa  diffusely, extensive inflammatory changes in duodenum, geographic ulceration and mucosal edema present, encroaching somewhat on the lumen yet still widely patent, distal second portion of duodenum appeared abnormal, path with peptic duodenitis with ulceration  . GROIN DEBRIDEMENT Right 10/14/2017   Procedure: RIGHT GROIN AND RIGHT LOWER QUADRANT ABDOMEN DEBRIDEMENT WITH PLACEMENT OF ANTIBIOTIC BEADS;  Surgeon: Serafina Mitchell, MD;  Location: [place];  Service: Vascular;  Laterality: Right;  . HEMATOMA EVACUATION Left 12/06/2017   Procedure: EVACUATION HEMATOMA;  Surgeon: Waynetta Sandy, MD;  Location: [place];  Service: Vascular;  Laterality: Left;  . HERNIA REPAIR  9242   umbilical  . I & D EXTREMITY Right 09/21/2017   Procedure: IRRIGATION AND DEBRIDEMENT  GROIN;  Surgeon: Elam Dutch, MD;  Location: [facility] OR;  Service: Vascular;  Laterality: Right;  . I & D EXTREMITY Left 11/23/2017   Procedure: Evacuation Hematoma LEFT UPPER ARM GRAFT;  Surgeon: Elam Dutch, MD;  Location: [place];  Service: Vascular;  Laterality: Left;  . INGUINAL HERNIA REPAIR Right 02/2017   [place]  . INSERTION OF DIALYSIS CATHETER Right 09/04/2017   Procedure: INSERTION OF TUNNELED  DIALYSIS CATHETER - RIGHT INTERNAL JUGULAR PLACEMENT;  Surgeon: Serafina Mitchell, MD;  Location: [place];  Service: Vascular;  Laterality: Right;  . INSERTION OF DIALYSIS CATHETER Right 07/26/2018   Procedure: INSERTION OF DIALYSIS CATHETER RIGHT INTERNAL JUGULAR;  Surgeon: Marty Heck, MD;  Location: [place];  Service: Vascular;  Laterality: Right;  . INSERTION OF ILIAC STENT Right 08/14/2017   Procedure: INSERTION OF RIGHT COMMON ILIAC STENT 47mm x 39mm x 130cm INSERTION OF RIGHT EXTERNAL ILIAC STENT 47mm x 75mm x 130cm INSERTION OF SUPERFICIAL FERMORAL ARTERY STENT 22mm x 52mm x 130cm;  Surgeon: Serafina Mitchell, MD;  Location: [place];  Service: Vascular;  Laterality: Right;  . IR FLUORO GUIDE CV LINE LEFT  08/31/2019  . IR FLUORO  GUIDE CV LINE RIGHT  08/31/2017  . IR FLUORO GUIDE CV LINE RIGHT  08/30/2019  . IR REMOVAL TUN CV CATH W/O FL  05/28/2018  . IR REMOVAL TUN CV CATH W/O FL  03/07/2019  . IR US GUIDE VASC ACCESS LEFT  08/31/2019  . IR US GUIDE VASC ACCESS RIGHT  08/31/2017  . IR US GUIDE VASC ACCESS RIGHT  08/30/2019  . IRRIGATION AND DEBRIDEMENT ABDOMEN  09/05/2019   Procedure: Irrigation And Debridement Abdomen, Placement of retention sutures;  Surgeon: Donnie Mesa, MD;  Location: [place];  Service: General;;  . LAPAROTOMY N/A 08/29/2019   Procedure: EXPLORATORY LAPAROTOMY, REPAIR OF SEROSAL INJURY TIMES THREE;  Surgeon: Jesusita Oka, MD;  Location: [place];  Service: General;  Laterality: N/A;  . LAPAROTOMY N/A 09/05/2019   Procedure: EXPLORATORY LAPAROTOMY;  Surgeon: Donnie Mesa, MD;  Location: [place];  Service: General;  Laterality: N/A;  . LOWER EXTREMITY ANGIOGRAPHY Right 08/11/2017   Procedure: LOWER EXTREMITY ANGIOGRAPHY;  Surgeon: Serafina Mitchell, MD;  Location: [place] CV LAB;  Service: Cardiovascular;  Laterality: Right;  . LYSIS OF ADHESION  08/29/2019   Procedure: Lysis Of Adhesion;  Surgeon: Jesusita Oka, MD;  Location: [place];  Service: General;;  . PATCH ANGIOPLASTY Right 08/14/2017   Procedure: PATCH ANGIOPLASTY USING HEMASHIELD PATCH 0.3IN Lillie Columbia;  Surgeon: Serafina Mitchell, MD;  Location: [place] OR;  Service: Vascular;  Laterality: Right;  . PERIPHERAL VASCULAR CATHETERIZATION N/A 09/20/2014   Procedure: Abdominal Aortogram;  Surgeon: Serafina Mitchell, MD;  Location: [place] CV LAB;  Service: Cardiovascular;  Laterality: N/A;  . POSTERIOR FUSION LUMBAR SPINE    . TEE WITHOUT CARDIOVERSION N/A 01/12/2018   Procedure: TRANSESOPHAGEAL ECHOCARDIOGRAM (TEE);  Surgeon: Lelon Perla, MD;  Location: [facility] ENDOSCOPY;  Service: Cardiovascular;  Laterality: N/A;   Family History  Family History  Problem Relation Age of Onset  . Alcoholism Mother   . Heart disease Father        Massive heart attack   . Heart attack Father   . Atrial fibrillation Father   . Colon cancer Father   . Colon cancer Maternal Grandfather 8  . Alcoholism Maternal Grandfather   . Renal cancer Cousin   . Ovarian cancer Sister    Social  History  reports that he has been smoking cigarettes. He has a 11.75 pack-year smoking history. He has never used smokeless tobacco. He reports previous alcohol use. He reports previous drug use. Allergies No Known Allergies Home medications Prior to Admission medications   Medication Sig Start Date End Date Taking? Authorizing Provider  albuterol (PROVENTIL HFA;VENTOLIN HFA) 108 (90 Base) MCG/ACT inhaler Inhale 2 puffs into the lungs every 6 (six) hours as needed for wheezing or shortness of breath.    [provider]  albuterol (PROVENTIL) (2.5 MG/3ML) 0.083% nebulizer solution Inhale 3 mLs into the lungs 3 (three) times daily as needed for shortness of breath or wheezing. 06/23/18   [provider]  apixaban (ELIQUIS) 5 MG TABS tablet Take 1 tablet (5 mg total) by mouth 2 (two) times daily. 09/11/19   Mercy Riding, MD  atorvastatin (LIPITOR) 80 MG tablet Take 80 mg by mouth daily. 04/25/19   [provider]  BREO ELLIPTA 200-25 MCG/INH AEPB Inhale 1 puff into the lungs daily as needed (for respiratory issues.).  07/27/17   [provider]  calcium acetate (PHOSLO) 667 MG capsule Take 667 mg by mouth 3 (three) times daily with meals. 06/29/18   [provider]  Darbepoetin Alfa (ARANESP) 60 MCG/0.3ML SOSY injection Inject 0.3 mLs (60 mcg total) into the vein every Wednesday with hemodialysis. 09/14/19   Mercy Riding, MD  diltiazem (CARDIZEM CD) 180 MG 24 hr capsule Take 1 capsule (180 mg total) by mouth daily. 09/28/19   Mercy Riding, MD  docusate sodium (COLACE) 100 MG capsule Take 1 capsule (100 mg total) by mouth daily. 09/11/19   Mercy Riding, MD  gabapentin (NEURONTIN) 300 MG capsule Take 1 capsule (300 mg total) by mouth at bedtime.  09/27/19   Mercy Riding, MD  metoprolol tartrate (LOPRESSOR) 25 MG tablet Take 1 tablet (25 mg total) by mouth 2 (two) times daily. 09/11/19   Mercy Riding, MD  multivitamin (RENA-VIT) TABS tablet Take 1 tablet by mouth at bedtime. 09/11/19   Mercy Riding, MD  ondansetron (ZOFRAN) 4 MG tablet Take 1 tablet (4 mg total) by mouth every 8 (eight) hours as needed for nausea. 09/11/19   Mercy Riding, MD  oxyCODONE-acetaminophen (PERCOCET) 10-325 MG tablet Take 1 tablet by mouth 5 (five) times daily as needed for pain.     [provider]  pantoprazole (PROTONIX) 40 MG tablet Take 1 tablet (40 mg total) by mouth 2 (two) times daily. Patient taking differently: Take 40 mg by mouth daily.  04/30/17 01/27/26  Manuella Ghazi, Pratik D, DO  senna-docusate (SENOKOT-S) 8.6-50 MG tablet Take 1 tablet by mouth 2 (two) times daily as needed for moderate constipation. 09/11/19   Mercy Riding, MD  varenicline (CHANTIX) 0.5 MG tablet Take 0.5 mg by mouth 2 (two) times daily.    [provider]     Vitals:   10/01/19 0700 10/01/19 0800 10/01/19 0820 10/01/19 0900  BP: 94/80 96/70    Pulse:  87  91  Resp: 13 15  (!) 0  Temp:   98.4 F (36.9 C)   TempSrc:      SpO2:  98%  100%   Exam Gen alert, chron ill appearing, no distress, nasal O2 No rash, cyanosis or gangrene Sclera anicteric, throat clear  No jvd or bruits Chest clear bilat to bases no rales  RRR no MRG Abd PD cath RLQ w/ clean dressing, midline abd wound w/ stained dressing  not removed, +BS GU normal male MS no joint effusions or deformity Ext 1-2+ pitting hip and pretib edema bilat Neuro is alert, Ox 3 , nf    Home meds:  - eliquis 5 bid/ lipitor 80/ diltiazem cd 180 qd/ metoprolol 25 bid   - chantix 0.5 bid/ percocet qid prn/ neurontine 300 hs  - breo ellipta qd/ albuterol prn  - protonix 40 x 2/ phoslo 1 tid ac  - prn's/ vitamins/ supplements    OP HD: MWF  [facility]  4h  400/600   RIJ TDC   90kg  Hep none   - epo 2400  tiw   - Fe 50mg / wk  IMPRESSION: 1. Increasing opacities in the bases bilaterally may represent layering effusion with underlying atelectasis. However, infiltratesuch as pneumonia or aspiration are not excluded and could have thesame appearance. 2. No other abnormalities.   Assessment/ Plan: 1. Syncope/ resp arrest - in ICU on pressors for hypotension, infection w/u in progress. CXR basilar infiltrates vs effusion, no fever yet. Needs CBC. Ordered to get fluid tested from abdomen to r/o peritonitis as well.  2. ESRD - on HD temporarily, hoping to go back to PD after abd wound heals over. Will plan short HD today to get rest of what he missed yest and for K+ control. 3. Hyperkalemia - renal diet, lokelma and HD 4. Abd wound - slow healing wound after SBO surgery x 2 during May hospital stay , followed by gen surg; continues to heal per the pt.  5. COPD - per primary 6. BP/ vol - on low dose pressor x 1, +mild pitting bilat LE edema. At dry wt. Start midodrine for BP support temporarily.  7. Anemia ckd - Hb 8.1, will give darbe on Monday 60ug (gets epo at op HD) 8. MBD ckd - Ca low but corr Ca 8.6 which is just under normal range, phos in range      Kelly Splinter  MD 10/01/2019, 9:54 AM  Recent Labs  Lab 09/26/19 0442 09/26/19 0442 09/27/19 0621 09/27/19 0941  WBC 15.3*  --   --  10.9*  HGB 8.7*   < > 8.4* 8.1*   < > = values in this interval not displayed.   Recent Labs  Lab 09/27/19 0621 10/01/19 0643  K 5.0 5.4*  BUN 29* 38*  CREATININE 4.32* 5.74*  CALCIUM 7.5* 6.6*  PHOS 2.6 4.5

## 2019-10-02 DIAGNOSIS — I4819 Other persistent atrial fibrillation: Secondary | ICD-10-CM

## 2019-10-02 DIAGNOSIS — R6521 Severe sepsis with septic shock: Secondary | ICD-10-CM

## 2019-10-02 DIAGNOSIS — A419 Sepsis, unspecified organism: Secondary | ICD-10-CM

## 2019-10-02 LAB — GLUCOSE, CAPILLARY
Glucose-Capillary: 102 mg/dL — ABNORMAL HIGH (ref 70–99)
Glucose-Capillary: 107 mg/dL — ABNORMAL HIGH (ref 70–99)
Glucose-Capillary: 108 mg/dL — ABNORMAL HIGH (ref 70–99)
Glucose-Capillary: 130 mg/dL — ABNORMAL HIGH (ref 70–99)
Glucose-Capillary: 89 mg/dL (ref 70–99)
Glucose-Capillary: 92 mg/dL (ref 70–99)

## 2019-10-02 LAB — CBC
HCT: 24.1 % — ABNORMAL LOW (ref 39.0–52.0)
Hemoglobin: 7.7 g/dL — ABNORMAL LOW (ref 13.0–17.0)
MCH: 30.4 pg (ref 26.0–34.0)
MCHC: 32 g/dL (ref 30.0–36.0)
MCV: 95.3 fL (ref 80.0–100.0)
Platelets: 249 10*3/uL (ref 150–400)
RBC: 2.53 MIL/uL — ABNORMAL LOW (ref 4.22–5.81)
RDW: 16.9 % — ABNORMAL HIGH (ref 11.5–15.5)
WBC: 30.5 10*3/uL — ABNORMAL HIGH (ref 4.0–10.5)
nRBC: 0 % (ref 0.0–0.2)

## 2019-10-02 LAB — BASIC METABOLIC PANEL
Anion gap: 9 (ref 5–15)
BUN: 23 mg/dL (ref 8–23)
CO2: 27 mmol/L (ref 22–32)
Calcium: 7.1 mg/dL — ABNORMAL LOW (ref 8.9–10.3)
Chloride: 100 mmol/L (ref 98–111)
Creatinine, Ser: 3.27 mg/dL — ABNORMAL HIGH (ref 0.61–1.24)
GFR calc Af Amer: 22 mL/min — ABNORMAL LOW (ref >60)
GFR calc non Af Amer: 19 mL/min — ABNORMAL LOW (ref >60)
Glucose, Bld: 103 mg/dL — ABNORMAL HIGH (ref 70–99)
Potassium: 3.6 mmol/L (ref 3.5–5.1)
Sodium: 136 mmol/L (ref 135–145)

## 2019-10-02 LAB — MAGNESIUM: Magnesium: 1.6 mg/dL — ABNORMAL LOW (ref 1.7–2.4)

## 2019-10-02 LAB — LACTIC ACID, PLASMA: Lactic Acid, Venous: 2.8 mmol/L (ref 0.5–1.9)

## 2019-10-02 LAB — PHOSPHORUS: Phosphorus: 3.4 mg/dL (ref 2.5–4.6)

## 2019-10-02 MED ORDER — DILTIAZEM HCL ER COATED BEADS 180 MG PO CP24: 180.0000 mg | ORAL_CAPSULE | Freq: Every day | ORAL | Status: DC

## 2019-10-02 MED ORDER — ALBUMIN HUMAN 25 % IV SOLN
12.5000 g | Freq: Once | INTRAVENOUS | Status: AC
  Administered 2019-10-02: 12.5 g via INTRAVENOUS
  Filled 2019-10-02: qty 50

## 2019-10-02 MED ORDER — PHENYLEPHRINE HCL-NACL 10-0.9 MG/250ML-% IV SOLN
0.0000 ug/min | INTRAVENOUS | Status: DC
  Administered 2019-10-02: 60 ug/min via INTRAVENOUS
  Administered 2019-10-02: 70 ug/min via INTRAVENOUS
  Filled 2019-10-02: qty 250

## 2019-10-02 MED ORDER — VANCOMYCIN VARIABLE DOSE PER UNSTABLE RENAL FUNCTION (PHARMACIST DOSING): Status: DC

## 2019-10-02 MED ORDER — SODIUM CHLORIDE 0.9 % IV SOLN
2.0000 g | Freq: Once | INTRAVENOUS | Status: AC
  Administered 2019-10-02: 2 g via INTRAVENOUS
  Filled 2019-10-02: qty 2

## 2019-10-02 MED ORDER — ONDANSETRON HCL 4 MG/2ML IJ SOLN
4.0000 mg | Freq: Three times a day (TID) | INTRAMUSCULAR | Status: DC
  Filled 2019-10-02: qty 2

## 2019-10-02 MED ORDER — DILTIAZEM HCL ER COATED BEADS 180 MG PO CP24
180.0000 mg | ORAL_CAPSULE | Freq: Every day | ORAL | Status: DC
  Administered 2019-10-02 – 2019-10-13 (×11): 180 mg via ORAL
  Filled 2019-10-02 (×12): qty 1

## 2019-10-02 MED ORDER — VANCOMYCIN HCL 2000 MG/400ML IV SOLN
2000.0000 mg | Freq: Once | INTRAVENOUS | Status: AC
  Administered 2019-10-02: 2000 mg via INTRAVENOUS
  Filled 2019-10-02: qty 400

## 2019-10-02 MED ORDER — HEPARIN 1000 UNIT/ML FOR PERITONEAL DIALYSIS: 500.0000 [IU] | INTRAMUSCULAR | Status: DC | PRN

## 2019-10-02 MED ORDER — NOREPINEPHRINE 4 MG/250ML-% IV SOLN: 0.0000 ug/min | INTRAVENOUS | Status: DC

## 2019-10-02 MED ORDER — PHENYLEPHRINE HCL-NACL 10-0.9 MG/250ML-% IV SOLN
INTRAVENOUS | Status: AC
  Administered 2019-10-02: 50 ug/min via INTRAVENOUS
  Filled 2019-10-02: qty 250

## 2019-10-02 MED ORDER — DELFLEX-LC/1.5% DEXTROSE 344 MOSM/L IP SOLN: INTRAPERITONEAL | Status: AC

## 2019-10-02 MED ORDER — CHLORHEXIDINE GLUCONATE CLOTH 2 % EX PADS
6.0000 | MEDICATED_PAD | Freq: Every day | CUTANEOUS | Status: DC
  Administered 2019-10-04 – 2019-10-11 (×6): 6 via TOPICAL

## 2019-10-02 MED ORDER — SODIUM CHLORIDE 0.9 % IV SOLN
1.0000 g | INTRAVENOUS | Status: DC
  Administered 2019-10-03: 1 g via INTRAVENOUS
  Filled 2019-10-02 (×2): qty 1

## 2019-10-02 MED ORDER — ONDANSETRON HCL 4 MG/2ML IJ SOLN
4.0000 mg | Freq: Three times a day (TID) | INTRAMUSCULAR | Status: DC | PRN
  Administered 2019-10-02 – 2019-10-08 (×6): 4 mg via INTRAVENOUS
  Filled 2019-10-02 (×4): qty 2

## 2019-10-02 MED ORDER — GENTAMICIN SULFATE 0.1 % EX CREA
1.0000 "application " | TOPICAL_CREAM | Freq: Every day | CUTANEOUS | Status: DC
  Administered 2019-10-02 – 2019-10-13 (×9): 1 via TOPICAL
  Filled 2019-10-02 (×2): qty 15

## 2019-10-02 MED ORDER — SODIUM CHLORIDE 0.9 % IV BOLUS
250.0000 mL | Freq: Once | INTRAVENOUS | Status: AC
  Administered 2019-10-02: 250 mL via INTRAVENOUS

## 2019-10-02 MED ORDER — HEPARIN SODIUM (PORCINE) 5000 UNIT/ML IJ SOLN
INTRAMUSCULAR | Status: AC
  Filled 2019-10-02: qty 5

## 2019-10-02 NOTE — Progress Notes (Signed)
[facility] Progress Note  Subjective: feeling better, off the pressor now  Vitals:   10/02/19 1200 10/02/19 1230 10/02/19 1300 10/02/19 1330  BP: (!) 105/92 124/86 100/76 138/70  Pulse: 82 (!) 46 (!) 104   Resp: (!) 22 (!) 22 17 16   Temp:      TempSrc:      SpO2: 100% 98% 98%   Weight:        Exam Gen alert, chron ill appearing, up in chair no distress No jvd or bruits Chest clear bilat to bases no rales  RRR no MRG Abd PD cath RLQ w/ clean exit site, midline abd wound w/ stained dressing not removed, +BS GU normal male MS no joint effusions or deformity Ext 1+ pitting hip and pretib edema bilat Neuro is alert, Ox 3 , nf    Home meds:  - eliquis 5 bid/ lipitor 80/ diltiazem cd 180 qd/ metoprolol 25 bid   - chantix 0.5 bid/ percocet qid prn/ neurontine 300 hs  - breo ellipta qd/ albuterol prn  - protonix 40 x 2/ phoslo 1 tid ac  - prn's/ vitamins/ supplements    OP HD: MWF  [facility]  4h  400/600   RIJ TDC   90kg  Hep none   - epo 2400 tiw   - Fe 50mg / wk  IMPRESSION: 1. Increasing opacities in the bases bilaterally may represent layering effusion with underlying atelectasis. However, infiltratesuch as pneumonia or aspiration are not excluded and could have thesame appearance. 2. No other abnormalities.   Assessment/ Plan: 1. Syncope/ resp arrest - no further episodes , on nasal O2.  Off pressors now and stable hemodynamically.  Has peritonitis and had PD cath flush just prior to his decline on Friday, suspect this is all infection related.  2. PD cath peritonitis - TNC 5314, 94% pmn's. WBC 30k. Needs abx to cover gram+ and gram- given complication of his abd wound, plan IV vanc/ fortaz per pharm. Not sure if PD cath is exposed in the wound, will ask for gen surg input.  Get f/u cell count in am.  3. ESRD - on HD using TDC for now. Plan for  HD tomorrow.  4. Hyperkalemia - resolved.  5. Abd wound - slow healing wound after SBO surgery x 2  during May hospital stay 6. COPD - per primary 7. BP/ vol - off pressor this am, 2kg up , started midodrine here for BP support temporarily 8. Anemia ckd - Hb 8.1, will give darbe on Monday 60ug (gets epo at op HD) 9. MBD ckd - corr Ca 8.6 which is just under normal, phos in range    Rob Jaryd Drew 10/02/2019, 2:44 PM   Recent Labs  Lab 09/27/19 0621 09/27/19 0941 10/01/19 0643 10/02/19 0219  K   < >  --  5.4* 3.6  BUN   < >  --  38* 23  CREATININE   < >  --  5.74* 3.27*  CALCIUM   < >  --  6.6* 7.1*  PHOS   < >  --  4.5 3.4  HGB  --  8.1*  --  7.7*   < > = values in this interval not displayed.   Inpatient medications: . apixaban  5 mg Oral BID  . atorvastatin  80 mg Oral Daily  . Chlorhexidine Gluconate Cloth  6 each Topical Q0600  . [START ON 10/03/2019] Chlorhexidine Gluconate Cloth  6 each Topical Q0600  . diltiazem  180  mg Oral Daily  . gabapentin  300 mg Oral QHS  . gentamicin cream  1 application Topical Daily  . midodrine  10 mg Oral TID WC  . vancomycin variable dose per unstable renal function (pharmacist dosing)   Does not apply See admin instructions   . [START ON 10/03/2019] cefTAZidime (FORTAZ)  IV    . cefTAZidime (FORTAZ)  IV    . dialysis solution 1.5% low-MG/low-CA    . phenylephrine (NEO-SYNEPHRINE) Adult infusion Stopped (10/02/19 1029)  . vancomycin     albuterol, docusate sodium, fluticasone furoate-vilanterol, heparin, dianeal solution for CAPD/CCPD with heparin, ondansetron (ZOFRAN) IV, oxyCODONE-acetaminophen, polyethylene glycol

## 2019-10-02 NOTE — Progress Notes (Addendum)
NAME:  Aaron [name], MRN:  [id number], DOB:  1955/12/16, LOS: 1 ADMISSION DATE:  10/01/2019, CONSULTATION DATE:  10/02/19 REFERRING MD:  Mercer Pod, CHIEF COMPLAINT:  hypotension   Brief History   64 y/o M with ESRD and Atrial fibrillation who became light-headed at dialysis on 6/18 and had a syncopal episode with reported respiratory arrest .  He went to [facility] ED and was transferred to [facility] for dialysis.  En route, was started on cardizem gtt and BP dropped.  PCCM consulted for hypotension.   History of present illness   64 y.o. M with PMH of ESRD and permanent Atrial fibrillation on Eliquis who was recently admitted for small bowel obstruction and is status post ex lap with retention sutures who was at dialysis on 6/18 when he became dizzy.  Per report he had a syncopal episode and respiratory arrest and was sent to [facility] emergency department.  His lactic acid was elevated to 6.6, no significant WBC or source of infection.  He reports continued abdominal pain that is stable since his surgery with one episode of diarrhea yesterday.  Pt states that he usually does peritoneal dialysis, but has had to do HD since his abdominal surgery.  He denies fever, chills, nausea, vomiting, chest pain or palpitations or any other infectious symptoms.   He was to be transferred to [name] to the hospitalist service when he developed RVR en route and was started on cardizem.  His BP then dropped and he was started on Neo, so was transferred to [facility].   Past Medical History   has a past medical history of Anxiety, Arthritis, Atrial fibrillation ([place]), CAD (coronary artery disease), Chronic back pain, COPD (chronic obstructive pulmonary disease) ([place]), Diverticulitis, Esophageal reflux, ESRD (end stage renal disease) on dialysis [facility]), Essential hypertension, Hepatitis C, History of kidney stones, History of syncope, Hyperlipidemia, Jerking (09/23/2014), Myocardial infarction ([place]) (02/2017), Non-compliant  behavior, PAT (paroxysmal atrial tachycardia) ([place]), Peripheral arterial disease ([place]), Pneumonia (1961), Polycystic kidney, unspecified type, Syncope (09/2014), and SYNCOPE (05/07/2010).   Significant Hospital Events   6/18 Presented to [facility] 6/19 Transfer to [facility]  Consults:  Cardiology Nephrology  Procedures:       Significant Diagnostic Tests:    Micro Data:  6/18 Sars-Cov-2>>negative 6/19 peritoneal fluid >>  Antimicrobials:  6/20 vancomycin >>  Interim history/subjective:   Out of bed to chair. Denies chest pain, dyspnea or palpitations Off Neo-Synephrine drip Underwent HD last night  Objective   Blood pressure 99/76, pulse (!) 104, temperature 97.6 F (36.4 C), resp. rate 16, weight 92 kg, SpO2 (!) 80 %.        Intake/Output Summary (Last 24 hours) at 10/02/2019 1153 Last data filed at 10/02/2019 1100 Gross per 24 hour  Intake 2910.4 ml  Output 1000 ml  Net 1910.4 ml   Filed Weights   10/01/19 2300 10/02/19 0145 10/02/19 0600  Weight: 86.6 kg 86.2 kg 92 kg     General:  Elderly, chronically ill-appearing M in no acute distress HEENT: MM pink/moist Neuro: awake, alert and oriented CV: s1s2 irregular, HR 110s PULM:  CTAB GI: soft, bsx4 active, large abdominal binder in place , PD catheter Extremities: warm/dry, no edema  Skin: no rashes or lesions   Labs show mild hypokalemia and hypomagnesemia, extreme leukocytosis and stable anemia  Resolved Hospital Problem list     Assessment & Plan:    Syncopal Episode with hypotension Unclear etiology, possibly arrythmia, per notes HR 140-150 initially.  Pt denies missing any  medication. No chest pain or hypoxia to suggest PE and patient is anti-coagulated. -Off Neo-Synephrine drip  Atrial Fibrillation/RVR Rate controlled with Metoprolol and cardizem at home -Per report, pt had a significant BP drop when Cardizem gtt initiated, now stopped  -Appreciate Cardiology/EP recommendations, resuming  Cardizem -continue Eliquis ,    Lactic acidosis, recent SBO s/p ex lap bowel resection , resolving Suspect peritonitis, septic shock -Start IV vancomycin, discussed with renal, PD catheter may be infected and may need removal , renal to coordinate with general surgery -if worsening abdominal pain consider CT abd/pelvis   ESRD Per pt was doing peritoneal dialysis until his recent surgery, now getting HD through L IJ M/W/F -Status post HD 6/19     Best practice:  Diet: Soft Pain/Anxiety/Delirium protocol (if indicated): Percocet VAP protocol (if indicated): n/a DVT prophylaxis: eliquis GI prophylaxis:  Glucose control:  Mobility: bed rest  Code Status: Full code, but pt states he would not want to stay on life support for any extended amount of time Family Communication:  Disposition: ICU , transferred to triad service 6/21, if stays off pressors, can transfer to telemetry 6/21   Kara Mead MD. FCCP. [facility]  If no response to pager , please call [phone]   10/02/2019

## 2019-10-02 NOTE — Progress Notes (Signed)
Progress Note  Patient Name: Aaron [name] Date of Encounter: 10/02/2019  [facility] Cardiologist: [facility])  Subjective   Currently feeling well without major complaint  Inpatient Medications    Scheduled Meds: . apixaban  5 mg Oral BID  . atorvastatin  80 mg Oral Daily  . Chlorhexidine Gluconate Cloth  6 each Topical Q0600  . diltiazem  180 mg Oral Daily  . gabapentin  300 mg Oral QHS  . gentamicin cream  1 application Topical Daily  . heparin      . midodrine  10 mg Oral TID WC   Continuous Infusions: . dialysis solution 1.5% low-MG/low-CA    . norepinephrine (LEVOPHED) Adult infusion    . phenylephrine (NEO-SYNEPHRINE) Adult infusion 50 mcg/min (10/02/19 0800)   PRN Meds: albuterol, docusate sodium, fluticasone furoate-vilanterol, dianeal solution for CAPD/CCPD with heparin, oxyCODONE-acetaminophen, polyethylene glycol   Vital Signs    Vitals:   10/02/19 0600 10/02/19 0700 10/02/19 0726 10/02/19 0800  BP: (!) 88/64 124/85  (!) 126/101  Pulse: (!) 47 (!) 105    Resp: 18 (!) 24  (!) 21  Temp:   98.1 F (36.7 C)   TempSrc:      SpO2: 100% 93%    Weight: 92 kg       Intake/Output Summary (Last 24 hours) at 10/02/2019 0815 Last data filed at 10/02/2019 0700 Gross per 24 hour  Intake 3278.59 ml  Output 1000 ml  Net 2278.59 ml   Last 3 Weights 10/02/2019 10/02/2019 10/01/2019  Weight (lbs) 202 lb 13.2 oz 190 lb 191 lb  Weight (kg) 92 kg 86.183 kg 86.637 kg      Telemetry    Atrial fibrillation- Personally Reviewed  ECG    None new- Personally Reviewed  Physical Exam   GEN: No acute distress.   Neck: No JVD Cardiac:  Tachycardic, irregular, no murmurs, rubs, or gallops.  Respiratory: Clear to auscultation bilaterally. GI: Soft, nontender, non-distended  MS: No edema; No deformity. Neuro:  Nonfocal  Psych: Normal affect   Labs    High Sensitivity Troponin:   Recent Labs  Lab 09/20/19 1306 09/20/19 1505  TROPONINIHS 29* 28*        Chemistry Recent Labs  Lab 09/26/19 0442 09/26/19 0442 09/27/19 0621 10/01/19 0643 10/02/19 0219  NA 132*   < > 136 134* 136  K 5.4*   < > 5.0 5.4* 3.6  CL 96*   < > 98 98 100  CO2 25   < > 26 22 27   GLUCOSE 101*   < > 137* 84 103*  BUN 46*   < > 29* 38* 23  CREATININE 5.84*   < > 4.32* 5.74* 3.27*  CALCIUM 7.6*   < > 7.5* 6.6* 7.1*  PROT  --   --   --  5.0*  --   ALBUMIN 1.5*  --  1.4* 1.4*  --   AST  --   --   --  52*  --   ALT  --   --   --  21  --   ALKPHOS  --   --   --  151*  --   BILITOT  --   --   --  0.9  --   GFRNONAA 9*   < > 14* 10* 19*  GFRAA 11*   < > 16* 11* 22*  ANIONGAP 11   < > 12 14 9    < > = values in this interval  not displayed.     Hematology Recent Labs  Lab 09/26/19 0442 09/26/19 0442 09/27/19 0621 09/27/19 0941 10/02/19 0219  WBC 15.3*  --   --  10.9* 30.5*  RBC 2.85*  --   --  2.63* 2.53*  HGB 8.7*   < > 8.4* 8.1* 7.7*  HCT 28.4*   < > 26.9* 26.0* 24.1*  MCV 99.6  --   --  98.9 95.3  MCH 30.5  --   --  30.8 30.4  MCHC 30.6  --   --  31.2 32.0  RDW 16.5*  --   --  16.5* 16.9*  PLT 334  --   --  306 249   < > = values in this interval not displayed.    BNP Recent Labs  Lab 10/01/19 0643  BNP 3,857.9*     DDimer No results for input(s): DDIMER in the last 168 hours.   Radiology    DG CHEST PORT 1 VIEW  Result Date: 10/01/2019 CLINICAL DATA:  Shortness of breath EXAM: PORTABLE CHEST 1 VIEW COMPARISON:  September 30, 2019 FINDINGS: Prominent skin fold is seen over the right lateral upper lung. No pneumothorax. The left-sided dialysis catheter is stable. Increasing opacities in the bases. Stable cardiomegaly. No other acute abnormalities. IMPRESSION: 1. Increasing opacities in the bases bilaterally may represent layering effusion with underlying atelectasis. However, infiltrate such as pneumonia or aspiration are not excluded and could have the same appearance. 2. No other abnormalities. Electronically Signed   By: Dorise Bullion  III M.D   On: 10/01/2019 10:34    Cardiac Studies   TTE 05/27/19 1. Left ventricular ejection fraction, by estimation, is 50 to 55%. The  left ventricle has low normal function. The left ventricle has no regional  wall motion abnormalities. There is mildly increased left ventricular  hypertrophy. Left ventricular  diastolic parameters are indeterminate.  2. Right ventricular systolic function is normal. The right ventricular  size is normal.  3. Left atrial size was moderately dilated.  4. Right atrial size was mildly dilated.  5. The mitral valve is normal in structure and function. Trivial mitral  valve regurgitation. No evidence of mitral stenosis.  6. The aortic valve is tricuspid. Aortic valve regurgitation is not  visualized. Mild aortic valve sclerosis is present, with no evidence of  aortic valve stenosis.  7. The inferior vena cava is normal in size with greater than 50%  respiratory variability, suggesting right atrial pressure of 3 mmHg.   Patient Profile     64 y.o. male with a history of end-stage renal disease and atrial fibrillation who presented with hypotension and rapid atrial fibrillation  Assessment & Plan    1.  Atrial fibrillation with rapid ventricular response: Heart rates have become mildly better controlled, though IV amiodarone was never started.  His blood pressure is also better controlled today.  We Aaron [name] continue his Eliquis for anticoagulation and restart his p.o. diltiazem.  This can be titrated up as needed.  He is also on metoprolol at home, but Aaron [name] hold this for the time being.  This can be restarted as well.  We Aaron [name] need to call [facility] at some point to get more information on his atrial fibrillation chronicity to see if a rhythm control strategy would be reasonable.  2.  End-stage renal disease: Patient on dialysis.  Nephrology has been consulted.     For questions or updates, please contact [place] Please consult  [email or web address] for contact info  under        Signed, Aaron Dias Meredith Leeds, MD  10/02/2019, 8:15 AM

## 2019-10-02 NOTE — Plan of Care (Signed)
  Problem: Education: Goal: Knowledge of General Education information will improve Description: Including pain rating scale, medication(s)/side effects and non-pharmacologic comfort measures Outcome: Progressing   Problem: Health Behavior/Discharge Planning: Goal: Ability to manage health-related needs will improve Outcome: Progressing   Problem: Clinical Measurements: Goal: Ability to maintain clinical measurements within normal limits will improve Outcome: Progressing Goal: Will remain free from infection Outcome: Progressing Goal: Diagnostic test results will improve Outcome: Progressing Goal: Respiratory complications will improve Outcome: Progressing Goal: Cardiovascular complication will be avoided Outcome: Progressing   Problem: Activity: Goal: Risk for activity intolerance will decrease Outcome: Progressing   Problem: Nutrition: Goal: Adequate nutrition will be maintained Outcome: Progressing   Problem: Coping: Goal: Level of anxiety will decrease Outcome: Progressing   Problem: Elimination: Goal: Will not experience complications related to bowel motility Outcome: Progressing Goal: Will not experience complications related to urinary retention Outcome: Progressing   Problem: Pain Managment: Goal: General experience of comfort will improve Outcome: Progressing   Problem: Safety: Goal: Ability to remain free from injury will improve Outcome: Progressing   Problem: Skin Integrity: Goal: Risk for impaired skin integrity will decrease Outcome: Progressing   Problem: Education: Goal: Knowledge of treatment and prevention of peritonitis will improve Outcome: Progressing   Problem: Health Behavior/Discharge Planning: Goal: Ability to manage health-related needs will improve Outcome: Progressing   Problem: Pain Management: Goal: Pain related to peritonitis will be minimal Outcome: Progressing   Problem: Clinical Measurements: Goal: Complications  related to peritonitis or its treatment will be avoided or minimized Outcome: Progressing   Problem: Skin Integrity: Goal: PD catheter exit site will improve or remain free of signs and symptoms of infection Outcome: Progressing   Problem: Fluid Volume: Goal: Fluid volume balance will be maintained or improved Outcome: Progressing   Problem: Nutritional: Goal: Ability to make appropriate dietary choices will improve Outcome: Progressing   Problem: Bowel/Gastric: Goal: Complications associated with peritonitis will be avoided or minimized Outcome: Progressing   Problem: Respiratory: Goal: Respiratory symptoms related to disease process will be avoided Outcome: Progressing   Problem: Coping: Goal: Ability to cope with  disease diagnosis and complications will improve Outcome: Progressing

## 2019-10-02 NOTE — Progress Notes (Signed)
Pharmacy Antibiotic Note  Aaron [name] is a 64 y.o. male admitted on 10/01/2019 with peritonitis.  Pharmacy has been consulted for vancomycin dosing.  Patient currently afebrile, wbc elevated at 30. Lactic acid 2.8. Patient is ESRD and received urgent HD overnight. Renal consult pending for further HD plans. Will load with vancomycin and follow up HD schedule and redose as indicated.   Target pre-HD level 15-25  Plan: Vancomycin 2g IV now then will plan on 1g after each HD session once schedule is known.   Weight: 92 kg (202 lb 13.2 oz)  Temp (24hrs), Avg:97.8 F (36.6 C), Min:97.6 F (36.4 C), Max:98.3 F (36.8 C)  Recent Labs  Lab 09/26/19 0442 09/27/19 0621 09/27/19 0941 10/01/19 0643 10/01/19 0904 10/02/19 0219 10/02/19 0317  WBC 15.3*  --  10.9*  --   --  30.5*  --   CREATININE 5.84* 4.32*  --  5.74*  --  3.27*  --   LATICACIDVEN  --   --   --  3.6* 3.4*  --  2.8*    Estimated Creatinine Clearance: 28.4 mL/min (A) (by C-G formula based on SCr of 3.27 mg/dL (H)).    No Known Allergies  Antimicrobials this admission: Vancomycin 6/20 >>  Microbiology results: 6/19 Peritoneal fluid - reincubate  Thank you for allowing pharmacy to be a part of this patient's care.  Erin Hearing PharmD., BCPS Clinical Pharmacist 10/02/2019 2:28 PM

## 2019-10-02 NOTE — Plan of Care (Signed)
  Problem: Pain Managment: Goal: General experience of comfort will improve Outcome: Progressing   Problem: Health Behavior/Discharge Planning: Goal: Ability to manage health-related needs will improve Outcome: Progressing

## 2019-10-02 NOTE — Progress Notes (Signed)
°   10/02/19 0145  Hand-Off documentation  Handoff Given Given to shift RN/LPN  Report given to (Full Name) Council Willims  Handoff Received Received from shift RN/LPN  Report received from (Full Name) Annibelle Brazie  Vital Signs  Temp 97.6 F (36.4 C)  Temp Source Oral  Pulse Rate (!) 106  Resp 20 (Simultaneous filing. User may not have seen previous data.)  BP (!) 85/69  BP Location Left Arm  BP Method Manual  Patient Position (if appropriate) Sitting  Oxygen Therapy  SpO2 99 %  O2 Device Room Air  Pain Assessment  Pain Scale 0-10  Pain Score 5  Faces Pain Scale 4  Pain Type Acute pain  Pain Location Bladder  Pain Orientation Lower  Pain Descriptors / Indicators Aching  Pain Frequency Constant  Pain Onset Progressive  Dialysis Weight  Weight 86.2 kg  Type of Weight Post-Dialysis  Post-Hemodialysis Assessment  Rinseback Volume (mL) 250 mL  KECN 284 V  Dialyzer Clearance Lightly streaked  Duration of HD Treatment -hour(s) 2 hour(s)  Hemodialysis Intake (mL) 700 mL  UF Total -Machine (mL) 1500 mL  Net UF (mL) 800 mL  Tolerated HD Treatment Yes  Post-Hemodialysis Comments tx achieved as scheduled, no complaints, pt stable, BP ha been low ut pt tolerated tx very well  AVG/AVF Arterial Site Held (minutes) 0 minutes  AVG/AVF Venous Site Held (minutes) 0 minutes  Hemodialysis Catheter Left Internal jugular Double lumen Permanent (Tunneled)  Placement Date/Time: 08/31/19 1354   Time Out: Correct patient;Correct site;Correct procedure  Maximum sterile barrier precautions: Hand hygiene;Cap;Mask;Sterile gown;Sterile gloves;Large sterile sheet  Site Prep: Chlorhexidine (preferred)  Local Anes...  Site Condition No complications  Blue Lumen Status Heparin locked;Capped (Central line)  Red Lumen Status Heparin locked;Capped (Central line)  Catheter fill solution Heparin 1000 units/ml  Catheter fill volume (Arterial) 2.1 cc  Catheter fill volume (Venous) 2.1  Dressing Type  Occlusive  Dressing Status Clean;Dry;Intact;New drainage  Interventions New dressing  Drainage Description None  Dressing Change Due  (due in 7 days)

## 2019-10-02 NOTE — Progress Notes (Signed)
eLink Physician-Brief Progress Note Patient Name: Aaron [name] DOB: 23-May-1955 MRN: [id number]   Date of Service  10/02/2019  HPI/Events of Note  Request for central line as neosynephrine re-started. Ongoing HD. Patient seen mentating appropriately. MAP at 70  eICU Interventions  Discussed with bedside RN that we can hold off on central line for now unless significant increase in pressor requirement or persistent hypotension even after dialysis.     Intervention Category Major Interventions: Hypotension - evaluation and management  Judd Lien 10/02/2019, 12:21 AM

## 2019-10-02 NOTE — Progress Notes (Addendum)
eLink Physician-Brief Progress Note Patient Name: Aaron [name] DOB: 27-Aug-1955 MRN: [id number]   Date of Service  10/02/2019  HPI/Events of Note  Notified of persistent hypotension and tachycardia 110s. Completed HD with removal of only 750 when goal was 1500. Patient continues to [facility] well.  eICU Interventions  Bolus 250 cc NS and assess response Also ordered albumin 25% as BP seems to have improved with volume     Intervention Category Major Interventions: Hypotension - evaluation and management  [name] 10/02/2019, 2:28 AM

## 2019-10-02 NOTE — Progress Notes (Signed)
Pharmacy Antibiotic Note  Aaron [name] is a 64 y.o. male admitted on 10/01/2019 with peritonitis.  Pharmacy has been consulted for vancomycin dosing.  Patient currently afebrile, wbc elevated at 30. Lactic acid 2.8. Patient is ESRD and received urgent HD overnight. Renal consult pending for further HD plans. Will load with vancomycin and follow up HD schedule and redose as indicated.   Adding ceftazidime for gram negative coverage.    Plan: Vancomycin 2g IV now then will plan on 1g after each HD session once schedule is known.   Ceftazidime 2g IV x1 then 1g IV q24h for now until HD regimen further elucidated  Weight: 92 kg (202 lb 13.2 oz)  Temp (24hrs), Avg:97.8 F (36.6 C), Min:97.6 F (36.4 C), Max:98.3 F (36.8 C)  Recent Labs  Lab 09/26/19 0442 09/27/19 0621 09/27/19 0941 10/01/19 0643 10/01/19 0904 10/02/19 0219 10/02/19 0317  WBC 15.3*  --  10.9*  --   --  30.5*  --   CREATININE 5.84* 4.32*  --  5.74*  --  3.27*  --   LATICACIDVEN  --   --   --  3.6* 3.4*  --  2.8*    Estimated Creatinine Clearance: 28.4 mL/min (A) (by C-G formula based on SCr of 3.27 mg/dL (H)).    No Known Allergies  Antimicrobials this admission: Vancomycin 6/20 >>  Microbiology results: 6/19 Peritoneal fluid - reincubate  Thank you for allowing pharmacy to be a part of this patient's care.  Arrie Senate, PharmD, BCPS Clinical Pharmacist [phone] Please check AMION for all [place] numbers 10/02/2019

## 2019-10-03 ENCOUNTER — Inpatient Hospital Stay (HOSPITAL_COMMUNITY): Payer: 59

## 2019-10-03 DIAGNOSIS — R55 Syncope and collapse: Secondary | ICD-10-CM

## 2019-10-03 DIAGNOSIS — Z9889 Other specified postprocedural states: Secondary | ICD-10-CM

## 2019-10-03 DIAGNOSIS — R0602 Shortness of breath: Secondary | ICD-10-CM

## 2019-10-03 DIAGNOSIS — N186 End stage renal disease: Secondary | ICD-10-CM

## 2019-10-03 LAB — GLUCOSE, CAPILLARY
Glucose-Capillary: 78 mg/dL (ref 70–99)
Glucose-Capillary: 75 mg/dL (ref 70–99)
Glucose-Capillary: 91 mg/dL (ref 70–99)
Glucose-Capillary: 87 mg/dL (ref 70–99)
Glucose-Capillary: 115 mg/dL — ABNORMAL HIGH (ref 70–99)

## 2019-10-03 LAB — PROTEIN, PLEURAL OR PERITONEAL FLUID: Total protein, fluid: <3 g/dL

## 2019-10-03 LAB — BODY FLUID CELL COUNT WITH DIFFERENTIAL
Eos, Fluid: 0 %
Lymphs, Fluid: 32 %
Monocyte-Macrophage-Serous Fluid: 27 % — ABNORMAL LOW (ref 50–90)
Neutrophil Count, Fluid: 41 % — ABNORMAL HIGH (ref 0–25)
Total Nucleated Cell Count, Fluid: 60 cu mm (ref 0–1000)

## 2019-10-03 LAB — LACTATE DEHYDROGENASE, PLEURAL OR PERITONEAL FLUID: LD, Fluid: 57 U/L — ABNORMAL HIGH (ref 3–23)

## 2019-10-03 LAB — PATHOLOGIST SMEAR REVIEW

## 2019-10-03 MED ORDER — HEPARIN SODIUM (PORCINE) 1000 UNIT/ML DIALYSIS
1000.0000 [IU] | INTRAMUSCULAR | Status: DC | PRN
  Filled 2019-10-03: qty 1

## 2019-10-03 MED ORDER — PREDNISONE 20 MG PO TABS
40.0000 mg | ORAL_TABLET | Freq: Every day | ORAL | Status: AC
  Administered 2019-10-04 – 2019-10-08 (×5): 40 mg via ORAL
  Filled 2019-10-03 (×5): qty 2

## 2019-10-03 MED ORDER — HEPARIN SODIUM (PORCINE) 1000 UNIT/ML IJ SOLN: 4000.0000 [IU] | INTRAMUSCULAR | Status: DC | PRN

## 2019-10-03 MED ORDER — VANCOMYCIN HCL IN DEXTROSE 1-5 GM/200ML-% IV SOLN
1000.0000 mg | INTRAVENOUS | Status: AC
  Administered 2019-10-03: 1000 mg via INTRAVENOUS
  Filled 2019-10-03: qty 200

## 2019-10-03 MED ORDER — PANTOPRAZOLE SODIUM 40 MG IV SOLR: 40.0000 mg | Freq: Every day | INTRAVENOUS | Status: DC

## 2019-10-03 NOTE — Progress Notes (Signed)
  [facility] Progress Note   Assessment/ Plan:   OP HD:MWF [facility] 4h 400/600 RIJ TDC 90kg Hep none - epo 2400 tiw - Fe 50mg / wk  Assessment/ Plan: 1. Syncope/ resp arrest - no further episodes , on nasal O2.  Off pressors now and stable hemodynamically.  Has peritonitis and had PD cath flush just prior to his decline on Friday, suspect this is all infection related.   2. PD cath peritonitis - TNC 5314, 94% pmn's. WBC 30k. Needs abx to cover gram+ and gram- given complication of his abd wound, plan IV vanc/ fortaz per pharm. Not sure if PD cath is exposed in the wound, appreciate gen surg input.  Will treat for now, repeat cell ct and culture ordered. 3. ESRD - on HD using TDC for now. HD on schedule MWF  4. Hyperkalemia - resolved.  5. Abd wound - slow healing wound after SBO surgery x 2 during May hospital stay 6. COPD - per primary 7. BP/ vol - off pressor this am, 2kg up , started midodrine here for BP support temporarily 8. 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  Off pressors.  Repeat flush pending.  D/w surgery.  Appreciate assistance.     Objective:   BP (!) 118/101   Pulse 95   Temp 98.1 F (36.7 C) (Oral)   Resp (!) 24   Wt 92.5 kg   SpO2 100%   BMI 24.82 kg/m   Physical Exam: Gen: NAD, sitting up eating breakfast CVS: RRR Resp: clear Abd: distended, soft, nontender, midline wound dressed, PD cath RLQ dressed Ext: 1+ LE edema ACCESS: PD cath, L IJ [facility]  Labs: BMET Recent Labs  Lab 09/27/19 0621 10/01/19 0643 10/02/19 0219  NA 136 134* 136  K 5.0 5.4* 3.6  CL 98 98 100  CO2 26 22 27   GLUCOSE 137* 84 103*  BUN 29* 38* 23  CREATININE 4.32* 5.74* 3.27*  CALCIUM 7.5* 6.6* 7.1*  PHOS 2.6 4.5 3.4   CBC Recent Labs  Lab 09/27/19 0621 09/27/19 0941 10/02/19 0219  WBC  --  10.9* 30.5*  HGB  8.4* 8.1* 7.7*  HCT 26.9* 26.0* 24.1*  MCV  --  98.9 95.3  PLT  --  306 249      Medications:    . apixaban  5 mg Oral BID  . atorvastatin  80 mg Oral Daily  . Chlorhexidine Gluconate Cloth  6 each Topical Q0600  . Chlorhexidine Gluconate Cloth  6 each Topical Q0600  . diltiazem  180 mg Oral Daily  . gabapentin  300 mg Oral QHS  . gentamicin cream  1 application Topical Daily  . midodrine  10 mg Oral TID WC  . [START ON 10/04/2019] predniSONE  40 mg Oral Q breakfast  . vancomycin variable dose per unstable renal function (pharmacist dosing)   Does not apply See admin instructions     Madelon Lips, MD 10/03/2019, 10:39 AM

## 2019-10-03 NOTE — Progress Notes (Signed)
PROGRESS NOTE  VIGNESH WILLERT LPF:[id number] DOB: 04-17-1955 DOA: 10/01/2019 PCP: Gwenlyn Saran, [place]   LOS: 2 days   Brief narrative: As per HPI, 64 y.o. Male with PMH of ESRD on peritoneal dialysis, history of  permanent Atrial fibrillation on Eliquis who was recently admitted for small bowel obstruction  status post ex lap with retention sutures  was at dialysis on 6/18 when he became dizzy and had syncopal episode and respiratory arrest.  He was then sent to [facility] emergency department with he was noted to have elevated lactate at 6.6.  He was then transferred to [facility] when en route developed atrial fibrillation with rapid ventricular response and was given Cardizem iv which dropped his blood pressure.  He was then started on Neo-Synephrine and was admitted to the ICU.   Assessment/Plan:  Active Problems:   Septic shock (HCC)   Atrial fibrillation (HCC)   Syncopal Episode with hypotension Possibly secondary to peritonitis volume depletion.  Currently off pressors. On midodrine trial.  2D echocardiogram on 05/27/2019 was 50 to 55%.  Atrial Fibrillation/RVR On presentation.  Seen by cardiology.  On Cardizem and Eliquis.  Beta-blockers on hold.  Better control at this time.  Cardiology recommending outpatient follow-up with elective cardioversion after minimum of 3 weeks of anticoagulation if needed.  Follow-up recommended with Dr. Wylene Men [facility].  Lactic acidosis, recent SBO s/p ex lap bowel resection , resolving Suspect peritonitis, septic shock On IV vancomycin and ceftazidime.  Nephrology to discuss with surgery regarding possible removal of peritoneal catheter.  ESRD Per pt was doing peritoneal dialysis until his recent surgery, now getting HD through [facility] M/W/F. Status post HD 6/19, scheduled for today.  Left-sided pleural effusion, dyspnea on exertion dyspnea.  Pulmonary has started on short course of prednisone.  Status post  left-sided thoracocentesis on 10/03/2019.  Follow diagnostic labs.    DVT prophylaxis: SCDs Start: 10/01/19 0607 apixaban (ELIQUIS) tablet 5 mg    Code Status: Full code  Family Communication: None  Status is: Inpatient  Remains inpatient appropriate because:Unsafe d/c plan, IV treatments appropriate due to intensity of illness or inability to take PO, Inpatient level of care appropriate due to severity of illness    Dispo: The patient is from: Home              Anticipated d/c is to: Home with home health.  Get PT evaluation.              Anticipated d/c date is: 2 days              Patient currently is not medically stable to d/c.  Consultants:  PCCM,   general surgery,   nephrology  Procedures:  Hemodialysis  Antibiotics:  . Ceftazidime, vancomycin  Anti-infectives (From admission, onward)   Start     Dose/Rate Route Frequency Ordered Stop   10/03/19 2000  cefTAZidime (FORTAZ) 1 g in sodium chloride 0.9 % 100 mL IVPB     Discontinue     1 g 200 mL/hr over 30 Minutes Intravenous Every 24 hours 10/02/19 1439     10/02/19 1600  vancomycin (VANCOREADY) IVPB 2000 mg/400 mL        2,000 mg 200 mL/hr over 120 Minutes Intravenous  Once 10/02/19 1425 10/02/19 1922   10/02/19 1445  cefTAZidime (FORTAZ) 2 g in sodium chloride 0.9 % 100 mL IVPB        2 g 200 mL/hr over 30 Minutes Intravenous  Once 10/02/19  1439 10/02/19 1722   10/02/19 1428  vancomycin variable dose per unstable renal function (pharmacist dosing)     Discontinue      Does not apply See admin instructions 10/02/19 1429        Subjective: Today, patient was seen and examined at bedside.  Today, complains of not feeling well.  Had a thoracocentesis.  Complains of mild shortness of breath and dyspnea.  Denies overt abdominal pain.  Objective: Vitals:   10/03/19 0500 10/03/19 0600  BP: 105/75 122/81  Pulse: 66 83  Resp: 15 17  Temp:  98.1 F (36.7 C)  SpO2: 100% 100%    Intake/Output Summary (Last  24 hours) at 10/03/2019 0725 Last data filed at 10/03/2019 0000 Gross per 24 hour  Intake 1051.72 ml  Output 50 ml  Net 1001.72 ml   Filed Weights   10/02/19 0145 10/02/19 0600 10/02/19 2159  Weight: 86.2 kg 92 kg 92.5 kg   Body mass index is 24.82 kg/m.   Physical Exam: GENERAL: Patient is alert awake and oriented. Not in obvious distress.  Chronically ill, thinly built.  On oxygen HENT: No scleral pallor or icterus. Pupils equally reactive to light. Oral mucosa is moist NECK: is supple, no gross swelling noted. CHEST: Diminished breath sounds over the bases, right chest wall hemodialysis catheter in place. CVS: S1 and S2 heard, no murmur. Regular rate and rhythm.  ABDOMEN: Soft, non-tender, bowel sounds are present.  Incisional dressing in place, peritoneal dialysis catheter in place. EXTREMITIES: No edema. CNS: Cranial nerves are intact. No focal motor deficits. SKIN: warm and dry , incisional dressing on the abdomen  Data Review: I have personally reviewed the following laboratory data and studies,  CBC: Recent Labs  Lab 09/27/19 0621 09/27/19 0941 10/02/19 0219  WBC  --  10.9* 30.5*  HGB 8.4* 8.1* 7.7*  HCT 26.9* 26.0* 24.1*  MCV  --  98.9 95.3  PLT  --  306 034   Basic Metabolic Panel: Recent Labs  Lab 09/27/19 0621 10/01/19 0643 10/02/19 0219  NA 136 134* 136  K 5.0 5.4* 3.6  CL 98 98 100  CO2 26 22 27   GLUCOSE 137* 84 103*  BUN 29* 38* 23  CREATININE 4.32* 5.74* 3.27*  CALCIUM 7.5* 6.6* 7.1*  MG 1.6* 1.3* 1.6*  PHOS 2.6 4.5 3.4   Liver Function Tests: Recent Labs  Lab 09/27/19 0621 10/01/19 0643  AST  --  52*  ALT  --  21  ALKPHOS  --  151*  BILITOT  --  0.9  PROT  --  5.0*  ALBUMIN 1.4* 1.4*   No results for input(s): LIPASE, AMYLASE in the last 168 hours. No results for input(s): AMMONIA in the last 168 hours. Cardiac Enzymes: No results for input(s): CKTOTAL, CKMB, CKMBINDEX, TROPONINI in the last 168 hours. BNP (last 3 results) Recent  Labs    05/26/19 1542 08/22/19 1930 10/01/19 0643  BNP 322.8* 1,578.0* 3,857.9*    ProBNP (last 3 results) No results for input(s): PROBNP in the last 8760 hours.  CBG: Recent Labs  Lab 10/02/19 1503 10/02/19 1934 10/02/19 2343 10/03/19 0434 10/03/19 0559  GLUCAP 108* 130* 89 78 75   Recent Results (from the past 240 hour(s))  MRSA PCR Screening     Status: None   Collection Time: 10/01/19  8:18 AM   Specimen: Nasal Mucosa; Nasopharyngeal  Result Value Ref Range Status   MRSA by PCR NEGATIVE NEGATIVE Final    Comment:  The GeneXpert MRSA Assay (FDA approved for NASAL specimens only), is one component of a comprehensive MRSA colonization surveillance program. It is not intended to diagnose MRSA infection nor to guide or monitor treatment for MRSA infections. Performed at [facility], [place] [address], [place], [place] [address]   Body fluid culture     Status: None (Preliminary result)   Collection Time: 10/01/19  1:20 PM   Specimen: Body Fluid  Result Value Ref Range Status   Specimen Description FLUID PERITONEAL DIALYSIS  Final   Special Requests Immunocompromised  Final   Gram Stain   Final    RARE WBC PRESENT, PREDOMINANTLY PMN NO ORGANISMS SEEN    Culture   Final    CULTURE REINCUBATED FOR BETTER GROWTH Performed at [facility], [place] [address], [place], [place] [address]    Report Status PENDING  Incomplete     Studies: No results found.    Flora Lipps, MD  [facility] 10/03/2019

## 2019-10-03 NOTE — Progress Notes (Signed)
PT Cancellation Note  Patient Details Name: NUMA SCHROETER MRN: [id number] DOB: May 26, 1955   Cancelled Treatment:    Reason Eval/Treat Not Completed: Patient at procedure or test/unavailable (HD)   Sinan Tuch B Shelbee Apgar 10/03/2019, 1:43 PM  Bayard Males, PT Acute Rehabilitation Services Pager: [phone] Office: [phone]

## 2019-10-03 NOTE — Progress Notes (Signed)
Progress Note  Patient Name: Aaron [name] Date of Encounter: 10/03/2019  [facility] Cardiologist: Denman [facility])  Subjective   No complaints. Remains in afib with CVR with PVC's overnight.  Inpatient Medications    Scheduled Meds:  apixaban  5 mg Oral BID   atorvastatin  80 mg Oral Daily   Chlorhexidine Gluconate Cloth  6 each Topical Q0600   Chlorhexidine Gluconate Cloth  6 each Topical Q0600   diltiazem  180 mg Oral Daily   gabapentin  300 mg Oral QHS   gentamicin cream  1 application Topical Daily   midodrine  10 mg Oral TID WC   vancomycin variable dose per unstable renal function (pharmacist dosing)   Does not apply See admin instructions   Continuous Infusions:  cefTAZidime (FORTAZ)  IV     dialysis solution 1.5% low-MG/low-CA     phenylephrine (NEO-SYNEPHRINE) Adult infusion Stopped (10/02/19 1029)   PRN Meds: albuterol, docusate sodium, fluticasone furoate-vilanterol, dianeal solution for CAPD/CCPD with heparin, ondansetron (ZOFRAN) IV, oxyCODONE-acetaminophen, polyethylene glycol   Vital Signs    Vitals:   10/03/19 0400 10/03/19 0430 10/03/19 0500 10/03/19 0600  BP: 106/89 (!) 112/94 105/75 122/81  Pulse: 84 92 66 83  Resp: (!) 9 16 15 17   Temp:  97.9 F (36.6 C)  98.1 F (36.7 C)  TempSrc:  Oral  Oral  SpO2: 100% 100% 100% 100%  Weight:        Intake/Output Summary (Last 24 hours) at 10/03/2019 0816 Last data filed at 10/03/2019 0000 Gross per 24 hour  Intake 985.26 ml  Output 50 ml  Net 935.26 ml   Last 3 Weights 10/02/2019 10/02/2019 10/02/2019  Weight (lbs) 203 lb 14.8 oz 202 lb 13.2 oz 190 lb  Weight (kg) 92.5 kg 92 kg 86.183 kg      Telemetry    Atrial fibrillation with CVR - Personally Reviewed  ECG    N/A- Personally Reviewed  Physical Exam   GEN: No acute distress.   Neck: No JVD Cardiac:  irregularly irregular, rate-controlled, no murmur Respiratory: Clear to auscultation bilaterally. GI: Soft,  nontender, non-distended  MS: No edema; No deformity. Neuro:  Nonfocal  Psych: Normal affect   Labs    High Sensitivity Troponin:   Recent Labs  Lab 09/20/19 1306 09/20/19 1505  TROPONINIHS 29* 28*      Chemistry Recent Labs  Lab 09/27/19 0621 10/01/19 0643 10/02/19 0219  NA 136 134* 136  K 5.0 5.4* 3.6  CL 98 98 100  CO2 26 22 27   GLUCOSE 137* 84 103*  BUN 29* 38* 23  CREATININE 4.32* 5.74* 3.27*  CALCIUM 7.5* 6.6* 7.1*  PROT  --  5.0*  --   ALBUMIN 1.4* 1.4*  --   AST  --  52*  --   ALT  --  21  --   ALKPHOS  --  151*  --   BILITOT  --  0.9  --   GFRNONAA 14* 10* 19*  GFRAA 16* 11* 22*  ANIONGAP 12 14 9      Hematology Recent Labs  Lab 09/27/19 0621 09/27/19 0941 10/02/19 0219  WBC  --  10.9* 30.5*  RBC  --  2.63* 2.53*  HGB 8.4* 8.1* 7.7*  HCT 26.9* 26.0* 24.1*  MCV  --  98.9 95.3  MCH  --  30.8 30.4  MCHC  --  31.2 32.0  RDW  --  16.5* 16.9*  PLT  --  306 249  BNP Recent Labs  Lab 10/01/19 0643  BNP 3,857.9*     DDimer No results for input(s): DDIMER in the last 168 hours.   Radiology    No results found.  Cardiac Studies   TTE 05/27/19 1. Left ventricular ejection fraction, by estimation, is 50 to 55%. The  left ventricle has low normal function. The left ventricle has no regional  wall motion abnormalities. There is mildly increased left ventricular  hypertrophy. Left ventricular  diastolic parameters are indeterminate.  2. Right ventricular systolic function is normal. The right ventricular  size is normal.  3. Left atrial size was moderately dilated.  4. Right atrial size was mildly dilated.  5. The mitral valve is normal in structure and function. Trivial mitral  valve regurgitation. No evidence of mitral stenosis.  6. The aortic valve is tricuspid. Aortic valve regurgitation is not  visualized. Mild aortic valve sclerosis is present, with no evidence of  aortic valve stenosis.  7. The inferior vena cava is normal  in size with greater than 50%  respiratory variability, suggesting right atrial pressure of 3 mmHg.   Patient Profile     64 y.o. male with a history of end-stage renal disease and atrial fibrillation who presented with hypotension and rapid atrial fibrillation  Assessment & Plan    1.  Atrial fibrillation with rapid ventricular response: Heart rates have become mildly better controlled, though IV amiodarone was never started.  His blood pressure is also better controlled today.  We will continue his Eliquis for anticoagulation and restart his p.o. diltiazem.  This can be titrated up as needed.  He is also on metoprolol at home, but will hold this for the time being.  This can be restarted as well. LVEF in 05/2019 was low normal at 50-55%.  - consider outpatient evaluation for elective cardioversion after minimum 3 weeks of anticoagulation with primary cardiologist.  2.  End-stage renal disease: Patient on peritoneal dialysis with peritonitis.  Nephrology has been consulted and is managing.    [facility] will sign off.   Medication Recommendations:  Continue current medications for afib Other recommendations (labs, testing, etc):  none Follow up as an outpatient:  Dr. Denman [facility])  For questions or updates, please contact [facility] Please consult [email or web address] for contact info under   Pixie Casino, MD, FACC, [place] Director of the [facility]  Card[facility] Diplomate of the American Board of Clinical Lipidology Attending Cardiologist  Direct Dial: [phone]   Fax: [phone]  Website:  [email or web address]  Pixie Casino, MD  10/03/2019, 8:16 AM

## 2019-10-03 NOTE — Procedures (Signed)
Thora w/ [place] Note Timeout performed Left chest examined with [place] and skin overlying fluid pocket marked Area prepped and anesthesized with 1% lidocaine 1200  cc straw  fluid removed Bandaid applied to site CXR pending No immediate complications

## 2019-10-03 NOTE — Progress Notes (Signed)
Left pleural fluid transudative, not infected. Fine for med tele once done with HD. Will sign off, call if I can be of further assistance.  Erskine Emery MD PCCM

## 2019-10-03 NOTE — Progress Notes (Signed)
NAME:  Aaron [name], MRN:  [id number], DOB:  [birth year], LOS: 2 ADMISSION DATE:  10/01/2019, CONSULTATION DATE:  10/03/19 REFERRING MD:  Mercer Pod, CHIEF COMPLAINT:  hypotension   Brief History   64 y/o M with ESRD and Atrial fibrillation who became light-headed at dialysis on 6/18 and had a syncopal episode with reported respiratory arrest .  He went to [facility] ED and was transferred to [facility] for dialysis.  En route, was started on cardizem gtt and BP dropped.  PCCM consulted for hypotension.   History of present illness   64 y.o. M with PMH of ESRD and permanent Atrial fibrillation on Eliquis who was recently admitted for small bowel obstruction and is status post ex lap with retention sutures who was at dialysis on 6/18 when he became dizzy.  Per report he had a syncopal episode and respiratory arrest and was sent to [facility] emergency department.  His lactic acid was elevated to 6.6, no significant WBC or source of infection.  He reports continued abdominal pain that is stable since his surgery with one episode of diarrhea yesterday.  Pt states that he usually does peritoneal dialysis, but has had to do HD since his abdominal surgery.  He denies fever, chills, nausea, vomiting, chest pain or palpitations or any other infectious symptoms.   He was to be transferred to [name] to the hospitalist service when he developed RVR en route and was started on cardizem.  His BP then dropped and he was started on Neo, so was transferred to [facility].   Past Medical History   has a past medical history of Anxiety, Arthritis, Atrial fibrillation ([place]), CAD (coronary artery disease), Chronic back pain, COPD (chronic obstructive pulmonary disease) ([place]), Diverticulitis, Esophageal reflux, ESRD (end stage renal disease) on dialysis [facility]), Essential hypertension, Hepatitis C, History of kidney stones, History of syncope, Hyperlipidemia, Jerking (09/23/2014), Myocardial infarction ([place]) (02/2017), Non-compliant  behavior, PAT (paroxysmal atrial tachycardia) ([place]), Peripheral arterial disease ([place]), Pneumonia (1961), Polycystic kidney, unspecified type, Syncope (09/2014), and SYNCOPE (05/07/2010).   Significant Hospital Events   6/18 Presented to [facility] 6/19 Transfer to [facility]  Consults:  Cardiology Nephrology  Procedures:       Significant Diagnostic Tests:    Micro Data:  6/18 Sars-Cov-2>>negative 6/19 peritoneal fluid >>  Antimicrobials:  6/20 vancomycin >>  Interim history/subjective:  Remains SOB. Increased secretions and wheezing. To have HD today.  Objective   Blood pressure (!) 102/57, pulse 85, temperature 98.1 F (36.7 C), temperature source Oral, resp. rate 15, weight 92.5 kg, SpO2 100 %.        Intake/Output Summary (Last 24 hours) at 10/03/2019 0944 Last data filed at 10/03/2019 0000 Gross per 24 hour  Intake 579.91 ml  Output 50 ml  Net 529.91 ml   Filed Weights   10/02/19 0145 10/02/19 0600 10/02/19 2159  Weight: 86.2 kg 92 kg 92.5 kg     GEN: chronically ill man laying in bed HEENT: temporal wasting CV: Irregular, ext warm PULM: Diminished over L base, moderate L effusion GI: Protuberant, PD catheter in place, incision site CDI EXT: No edema, +muscle wasting NEURO: Moves alll 4 ext to command PSYCH: RASS 0 SKIN: No rashes  No labs today  Resolved Hospital Problem list     Assessment & Plan:    Syncopal Episode with hypotension Suspected related to peritonitis and relative dehydration - Resolved, on a midodrine trial, to had trial of iHD today.  Atrial Fibrillation/RVR Rate controlled with Metoprolol and cardizem at  home -Per report, pt had a significant BP drop when Cardizem gtt initiated, now stopped  -Continue eliquis, cardizem, hold BB for time being  Lactic acidosis, recent SBO s/p ex lap bowel resection , resolving Peritonitis with indwelling PD catheter -Defer to nephrology discussion with general surgery on if/when PD catheter  should come out, if removed I would favor   ESRD Per pt was doing peritoneal dialysis until his recent surgery, now getting HD through [facility] M/W/F -iHD today  L effusion, wheezing, DOE, increased sputum-  - short course of prednisone, flutter, IS - Drain L effusion, send for usual labs   Best practice:  Diet: Soft Pain/Anxiety/Delirium protocol (if indicated): Percocet VAP protocol (if indicated): n/a DVT prophylaxis: eliquis GI prophylaxis: N/A Glucose control: N/A Mobility: up to chair Code Status: Full code, but pt states he would not want to stay on life support for any extended amount of time Family Communication: updated patient Disposition: to tele after HD, appreciate [facility] taking over care  Erskine Emery MD

## 2019-10-03 NOTE — Progress Notes (Addendum)
   Subjective/Chief Complaint: No complaints. Tolerating diet and having bm's   Objective: Vital signs in last 24 hours: Temp:  [97.3 F (36.3 C)-98.1 F (36.7 C)] 98.1 F (36.7 C) (06/21 0600) Pulse Rate:  [25-143] 83 (06/21 0600) Resp:  [9-34] 17 (06/21 0600) BP: (77-138)/(57-104) 122/81 (06/21 0600) SpO2:  [92 %-100 %] 100 % (06/21 0600) Weight:  [92.5 kg] 92.5 kg (06/20 2159) Last BM Date: 09/30/19  Intake/Output from previous day: 06/20 0701 - 06/21 0700 In: 1051.7 [P.O.:900; I.V.:151.7] Out: 50 [Urine:50] Intake/Output this shift: No intake/output data recorded.  General appearance: alert and cooperative Resp: clear to auscultation bilaterally Cardio: regular rate and rhythm GI: soft, nontender. wound clean. I do not see pd cath in wound  Lab Results:  Recent Labs    10/02/19 0219  WBC 30.5*  HGB 7.7*  HCT 24.1*  PLT 249   BMET Recent Labs    10/01/19 0643 10/02/19 0219  NA 134* 136  K 5.4* 3.6  CL 98 100  CO2 22 27  GLUCOSE 84 103*  BUN 38* 23  CREATININE 5.74* 3.27*  CALCIUM 6.6* 7.1*   PT/INR Recent Labs    10/01/19 0643  LABPROT 18.6*  INR 1.6*   ABG No results for input(s): PHART, HCO3 in the last 72 hours.  Invalid input(s): PCO2, PO2  Studies/Results: No results found.  Anti-infectives: Anti-infectives (From admission, onward)   Start     Dose/Rate Route Frequency Ordered Stop   10/03/19 2000  cefTAZidime (FORTAZ) 1 g in sodium chloride 0.9 % 100 mL IVPB     Discontinue     1 g 200 mL/hr over 30 Minutes Intravenous Every 24 hours 10/02/19 1439     10/03/19 1200  vancomycin (VANCOCIN) IVPB 1000 mg/200 mL premix     Discontinue     1,000 mg 200 mL/hr over 60 Minutes Intravenous Every M-W-F (Hemodialysis) 10/03/19 0824 10/05/19 1159   10/02/19 1600  vancomycin (VANCOREADY) IVPB 2000 mg/400 mL        2,000 mg 200 mL/hr over 120 Minutes Intravenous  Once 10/02/19 1425 10/02/19 1922   10/02/19 1445  cefTAZidime (FORTAZ) 2 g in  sodium chloride 0.9 % 100 mL IVPB        2 g 200 mL/hr over 30 Minutes Intravenous  Once 10/02/19 1439 10/02/19 1722   10/02/19 1428  vancomycin variable dose per unstable renal function (pharmacist dosing)     Discontinue      Does not apply See admin instructions 10/02/19 1429        Assessment/Plan: s/p * No surgery found * Advance diet  CHF COPD A fib- eliquis on hold Polycystic kidney disease, ESRD, on PD - converted to HD  H/O MI - plavix on hold CAD Hep C HLD Pneumonia? Hyperkalemia  SBO -Exploratory laparotomy, LOA, repair of SB serosal injury x3, resection of mesenteric nodule 08/29/19 Dr. Royetta Crochet -Exploratory laparotomy, washout, abdominal wall closure with placement of retention sutures for fascial dehiscence5/24/21 Dr. Zipporah Plants - Wound as above.Continue BID wet to dry dressing changes. Continue abdominal binder. We will discuss if middle and inferior sutures need to remain in place. We will see again Thursday if patient remains admitted. Follow up info on AVS.  FEN - soft diet VTE - SCDs ID -rocephin  Foley - none Follow up - Dr. Bobbye Morton  LOS: 2 days    Aaron [name] 10/03/2019

## 2019-10-03 NOTE — Progress Notes (Signed)
Pharmacy Antibiotic Note  Aaron [name] is a 64 y.o. male admitted on 10/01/2019 with peritonitis.  Pharmacy has been consulted for vancomycin and fortaz dosing. Patient with  ESRD and for HD today  -WBC= 30.5, afeb -peritoneal fluid- high neutrophil count, cultures pending   Plan: -Vancomycin 1gm IV post HD today -Will follow cultures and HD plans  Weight: 92.5 kg (203 lb 14.8 oz)  Temp (24hrs), Avg:97.7 F (36.5 C), Min:97.3 F (36.3 C), Max:98.1 F (36.7 C)  Recent Labs  Lab 09/27/19 0621 09/27/19 0941 10/01/19 0643 10/01/19 0904 10/02/19 0219 10/02/19 0317  WBC  --  10.9*  --   --  30.5*  --   CREATININE 4.32*  --  5.74*  --  3.27*  --   LATICACIDVEN  --   --  3.6* 3.4*  --  2.8*    Estimated Creatinine Clearance: 28.4 mL/min (A) (by C-G formula based on SCr of 3.27 mg/dL (H)).    No Known Allergies  Antimicrobials this admission: Vancomycin 6/20 >>  Microbiology results: 6/19 Peritoneal fluid - reincubate  Thank you for allowing pharmacy to be a part of this patient's care.  Hildred Laser, PharmD Clinical Pharmacist **Pharmacist phone directory can now be found on [email or web address] ([email or web address]).  Listed under [place].

## 2019-10-04 ENCOUNTER — Inpatient Hospital Stay (HOSPITAL_COMMUNITY): Payer: 59

## 2019-10-04 ENCOUNTER — Other Ambulatory Visit: Payer: Self-pay

## 2019-10-04 ENCOUNTER — Encounter (HOSPITAL_COMMUNITY): Payer: Self-pay | Admitting: Pulmonary Disease

## 2019-10-04 DIAGNOSIS — T148XXA Other injury of unspecified body region, initial encounter: Secondary | ICD-10-CM

## 2019-10-04 DIAGNOSIS — K659 Peritonitis, unspecified: Secondary | ICD-10-CM

## 2019-10-04 DIAGNOSIS — I998 Other disorder of circulatory system: Secondary | ICD-10-CM

## 2019-10-04 DIAGNOSIS — L97519 Non-pressure chronic ulcer of other part of right foot with unspecified severity: Secondary | ICD-10-CM

## 2019-10-04 DIAGNOSIS — T8130XS Disruption of wound, unspecified, sequela: Secondary | ICD-10-CM

## 2019-10-04 DIAGNOSIS — Z992 Dependence on renal dialysis: Secondary | ICD-10-CM

## 2019-10-04 DIAGNOSIS — M79671 Pain in right foot: Secondary | ICD-10-CM

## 2019-10-04 DIAGNOSIS — N186 End stage renal disease: Secondary | ICD-10-CM

## 2019-10-04 LAB — CBC
HCT: 24.1 % — ABNORMAL LOW (ref 39.0–52.0)
Hemoglobin: 7.7 g/dL — ABNORMAL LOW (ref 13.0–17.0)
MCH: 30.4 pg (ref 26.0–34.0)
MCHC: 32 g/dL (ref 30.0–36.0)
MCV: 95.3 fL (ref 80.0–100.0)
Platelets: 216 10*3/uL (ref 150–400)
RBC: 2.53 MIL/uL — ABNORMAL LOW (ref 4.22–5.81)
RDW: 16.8 % — ABNORMAL HIGH (ref 11.5–15.5)
WBC: 13.3 10*3/uL — ABNORMAL HIGH (ref 4.0–10.5)
nRBC: 0 % (ref 0.0–0.2)

## 2019-10-04 LAB — COMPREHENSIVE METABOLIC PANEL
ALT: 16 U/L (ref 0–44)
AST: 34 U/L (ref 15–41)
Albumin: 1.5 g/dL — ABNORMAL LOW (ref 3.5–5.0)
Alkaline Phosphatase: 272 U/L — ABNORMAL HIGH (ref 38–126)
Anion gap: 10 (ref 5–15)
BUN: 27 mg/dL — ABNORMAL HIGH (ref 8–23)
CO2: 26 mmol/L (ref 22–32)
Calcium: 6.8 mg/dL — ABNORMAL LOW (ref 8.9–10.3)
Chloride: 99 mmol/L (ref 98–111)
Creatinine, Ser: 3.01 mg/dL — ABNORMAL HIGH (ref 0.61–1.24)
GFR calc Af Amer: 24 mL/min — ABNORMAL LOW (ref >60)
GFR calc non Af Amer: 21 mL/min — ABNORMAL LOW (ref >60)
Glucose, Bld: 96 mg/dL (ref 70–99)
Potassium: 4.1 mmol/L (ref 3.5–5.1)
Sodium: 135 mmol/L (ref 135–145)
Total Bilirubin: 0.6 mg/dL (ref 0.3–1.2)
Total Protein: 5.1 g/dL — ABNORMAL LOW (ref 6.5–8.1)

## 2019-10-04 LAB — GLUCOSE, CAPILLARY
Glucose-Capillary: 101 mg/dL — ABNORMAL HIGH (ref 70–99)
Glucose-Capillary: 107 mg/dL — ABNORMAL HIGH (ref 70–99)
Glucose-Capillary: 126 mg/dL — ABNORMAL HIGH (ref 70–99)
Glucose-Capillary: 89 mg/dL (ref 70–99)
Glucose-Capillary: 95 mg/dL (ref 70–99)

## 2019-10-04 LAB — BODY FLUID CULTURE

## 2019-10-04 LAB — BODY FLUID CELL COUNT WITH DIFFERENTIAL
Eos, Fluid: 0 %
Lymphs, Fluid: 10 %
Monocyte-Macrophage-Serous Fluid: 7 % — ABNORMAL LOW (ref 50–90)
Neutrophil Count, Fluid: 83 % — ABNORMAL HIGH (ref 0–25)
Total Nucleated Cell Count, Fluid: 26150 cu mm — ABNORMAL HIGH (ref 0–1000)

## 2019-10-04 LAB — PHOSPHORUS: Phosphorus: 2.6 mg/dL (ref 2.5–4.6)

## 2019-10-04 LAB — MAGNESIUM: Magnesium: 1.4 mg/dL — ABNORMAL LOW (ref 1.7–2.4)

## 2019-10-04 LAB — PATHOLOGIST SMEAR REVIEW

## 2019-10-04 MED ORDER — CAMPHOR-MENTHOL 0.5-0.5 % EX LOTN
TOPICAL_LOTION | CUTANEOUS | Status: DC | PRN
  Administered 2019-10-05: 1 via TOPICAL
  Filled 2019-10-04: qty 222

## 2019-10-04 MED ORDER — SODIUM CHLORIDE 0.9 % IV SOLN: 250.0000 mL | INTRAVENOUS | Status: DC | PRN

## 2019-10-04 MED ORDER — SODIUM CHLORIDE 0.9% FLUSH
3.0000 mL | Freq: Two times a day (BID) | INTRAVENOUS | Status: DC
  Administered 2019-10-04 – 2019-10-06 (×4): 3 mL via INTRAVENOUS

## 2019-10-04 MED ORDER — MAGNESIUM SULFATE 4 GM/100ML IV SOLN
4.0000 g | Freq: Once | INTRAVENOUS | Status: AC
  Administered 2019-10-04: 4 g via INTRAVENOUS
  Filled 2019-10-04: qty 100

## 2019-10-04 MED ORDER — ALTEPLASE 2 MG IJ SOLR
INTRAMUSCULAR | Status: AC
  Filled 2019-10-04: qty 4

## 2019-10-04 MED ORDER — ALTEPLASE 2 MG IJ SOLR
4.0000 mg | Freq: Once | INTRAMUSCULAR | Status: AC
  Administered 2019-10-04: 4 mg

## 2019-10-04 MED ORDER — HEPARIN (PORCINE) 25000 UT/250ML-% IV SOLN
1350.0000 [IU]/h | INTRAVENOUS | Status: DC
  Administered 2019-10-04: 1250 [IU]/h via INTRAVENOUS
  Administered 2019-10-05: 1350 [IU]/h via INTRAVENOUS
  Administered 2019-10-06 – 2019-10-07 (×3): 1500 [IU]/h via INTRAVENOUS
  Administered 2019-10-08: 1350 [IU]/h via INTRAVENOUS
  Filled 2019-10-04 (×5): qty 250

## 2019-10-04 MED ORDER — IOHEXOL 350 MG/ML SOLN
150.0000 mL | Freq: Once | INTRAVENOUS | Status: AC | PRN
  Administered 2019-10-04: 150 mL via INTRAVENOUS

## 2019-10-04 MED ORDER — SODIUM CHLORIDE 0.9% FLUSH: 3.0000 mL | INTRAVENOUS | Status: DC | PRN

## 2019-10-04 MED ORDER — PIPERACILLIN-TAZOBACTAM IN DEX 2-0.25 GM/50ML IV SOLN
2.2500 g | Freq: Three times a day (TID) | INTRAVENOUS | Status: DC
  Administered 2019-10-04 – 2019-10-11 (×18): 2.25 g via INTRAVENOUS
  Filled 2019-10-04 (×22): qty 50

## 2019-10-04 NOTE — Progress Notes (Signed)
ANTICOAGULATION CONSULT NOTE - Initial Consult  Pharmacy Consult for heparin  Indication: atheroembolism  No Known Allergies  Patient Measurements: Weight: 90.8 kg (200 lb 2.8 oz)  Vital Signs: Temp: 98 F (36.7 C) (06/22 0400) Temp Source: Oral (06/22 0400) BP: 127/68 (06/22 0900) Pulse Rate: 78 (06/22 0800)  Labs: Recent Labs    10/02/19 0219 10/04/19 0222  HGB 7.7* 7.7*  HCT 24.1* 24.1*  PLT 249 216  CREATININE 3.27* 3.01*    Estimated Creatinine Clearance: 30.8 mL/min (A) (by C-G formula based on SCr of 3.01 mg/dL (H)).   Medical History: Past Medical History:  Diagnosis Date  . Anxiety   . Arthritis    knees , back , shoulders (10/12/2017)  . Atrial fibrillation ([place])   . CAD (coronary artery disease)    Mild nonobstructive disease at cardiac catheterization 2002  . Chronic back pain    "back of my neck; down thru my legs" (10/12/2017)  . COPD (chronic obstructive pulmonary disease) ([place])   . Diverticulitis   . Esophageal reflux   . ESRD (end stage renal disease) on dialysis [facility])    "TTS; [place]" (11/23/2017)  . Essential hypertension   . Hepatitis C    states he no longer has this  . History of kidney stones   . History of syncope   . Hyperlipidemia   . Jerking 09/23/2014  . Myocardial infarction ([place]) 02/2017   "light one" (10/12/2017)  . Non-compliant behavior    non compliant with diaylsis per daughter  . PAT (paroxysmal atrial tachycardia) ([place])   . Peripheral arterial disease ([place])    Occluded left superficial femoral artery status post stent June 2016 - Dr. Trula Slade  . Pneumonia 1961  . Polycystic kidney, unspecified type   . Syncope 09/2014  . SYNCOPE 05/07/2010   Qualifier: Diagnosis of  By: Laurance Flatten, RN, BSN, Anderson [place]      Medications:  Medications Prior to Admission  Medication Sig Dispense Refill Last Dose  . albuterol (PROVENTIL HFA;VENTOLIN HFA) 108 (90 Base) MCG/ACT inhaler Inhale 2 puffs into the lungs every 6 (six) hours as needed for  wheezing or shortness of breath.   Past Week at Unknown time  . albuterol (PROVENTIL) (2.5 MG/3ML) 0.083% nebulizer solution Inhale 3 mLs into the lungs 3 (three) times daily as needed for shortness of breath or wheezing.   09/30/2019 at Unknown time  . apixaban (ELIQUIS) 5 MG TABS tablet Take 1 tablet (5 mg total) by mouth 2 (two) times daily. 60 tablet 1 09/29/2019 at 2100  . atorvastatin (LIPITOR) 80 MG tablet Take 80 mg by mouth daily.   09/29/2019 at Unknown time  . BREO ELLIPTA 200-25 MCG/INH AEPB Inhale 1 puff into the lungs daily as needed (for respiratory issues.).    Past Week at Unknown time  . calcium acetate (PHOSLO) 667 MG capsule Take 667 mg by mouth 3 (three) times daily with meals.   09/29/2019  . Darbepoetin Alfa (ARANESP) 60 MCG/0.3ML SOSY injection Inject 0.3 mLs (60 mcg total) into the vein every Wednesday with hemodialysis. 4.2 mL  09/28/2019  . diltiazem (CARDIZEM CD) 180 MG 24 hr capsule Take 1 capsule (180 mg total) by mouth daily. 90 capsule 1 09/29/2019  . docusate sodium (COLACE) 100 MG capsule Take 1 capsule (100 mg total) by mouth daily. 10 capsule 0 09/29/2019  . gabapentin (NEURONTIN) 300 MG capsule Take 1 capsule (300 mg total) by mouth at bedtime. 90 capsule 1 09/29/2019  . metoprolol tartrate (LOPRESSOR) 25 MG  tablet Take 1 tablet (25 mg total) by mouth 2 (two) times daily. 180 tablet 1 09/29/2019 at 2100  . multivitamin (RENA-VIT) TABS tablet Take 1 tablet by mouth at bedtime. 90 tablet 1 09/29/2019  . ondansetron (ZOFRAN) 4 MG tablet Take 1 tablet (4 mg total) by mouth every 8 (eight) hours as needed for nausea. 20 tablet 0 Past Week at Unknown time  . oxyCODONE-acetaminophen (PERCOCET) 10-325 MG tablet Take 1 tablet by mouth 5 (five) times daily as needed for pain.    09/29/2019  . pantoprazole (PROTONIX) 40 MG tablet Take 1 tablet (40 mg total) by mouth 2 (two) times daily. (Patient taking differently: Take 40 mg by mouth daily. ) 60 tablet 1 09/29/2019  . senna-docusate  (SENOKOT-S) 8.6-50 MG tablet Take 1 tablet by mouth 2 (two) times daily as needed for moderate constipation.   Past Week at Unknown time  . varenicline (CHANTIX) 0.5 MG tablet Take 0.5 mg by mouth 2 (two) times daily.   09/29/2019    Assessment: 64 yo male R foot ischemia. He is on apixaban for afib (last dose ~ 9am on 6/22). Pharmacy to dose heparin for atheroembolism and possible procedures -hg= 7.7, plt= 216  Goal of Therapy:  Heparin level 0.3-0.7 units/ml aPTT 66-102 seconds Monitor platelets by anticoagulation protocol: Yes   Plan:  -No heparin bolus -Begin heparin infusion at 1250 units/hr at 9:30pm -aPTT in 8hours with heparin level -CBC, aPTT and heparin level daily  Hildred Laser, PharmD Clinical Pharmacist **Pharmacist phone directory can now be found on [email or web address] ([email or web address]).  Listed under [place].

## 2019-10-04 NOTE — Progress Notes (Signed)
   Subjective/Chief Complaint: No complaints. Feels good   Objective: Vital signs in last 24 hours: Temp:  [97.5 F (36.4 C)-98.7 F (37.1 C)] 98 F (36.7 C) (06/22 0400) Pulse Rate:  [29-99] 47 (06/22 0500) Resp:  [9-28] 17 (06/22 0500) BP: (98-133)/(57-101) 119/87 (06/22 0500) SpO2:  [99 %-100 %] 100 % (06/21 1900) Weight:  [90.8 kg-92.8 kg] 90.8 kg (06/22 0500) Last BM Date: 09/30/19  Intake/Output from previous day: 06/21 0701 - 06/22 0700 In: 684 [P.O.:684] Out: 2100 [Urine:100] Intake/Output this shift: No intake/output data recorded.  General appearance: alert and cooperative Resp: clear to auscultation bilaterally Cardio: regular rate and rhythm GI: soft, nontender. wound clean  Lab Results:  Recent Labs    10/02/19 0219 10/04/19 0222  WBC 30.5* 13.3*  HGB 7.7* 7.7*  HCT 24.1* 24.1*  PLT 249 216   BMET Recent Labs    10/02/19 0219 10/04/19 0222  NA 136 135  K 3.6 4.1  CL 100 99  CO2 27 26  GLUCOSE 103* 96  BUN 23 27*  CREATININE 3.27* 3.01*  CALCIUM 7.1* 6.8*   PT/INR No results for input(s): LABPROT, INR in the last 72 hours. ABG No results for input(s): PHART, HCO3 in the last 72 hours.  Invalid input(s): PCO2, PO2  Studies/Results: DG Chest 1 View  Result Date: 10/03/2019 CLINICAL DATA:  Status post thoracentesis EXAM: CHEST  1 VIEW COMPARISON:  10/01/2018 FINDINGS: Cardiac shadow is stable. Dialysis catheter is again seen. Small right pleural effusion is again noted. Left effusion is been reduced following thoracentesis. No pneumothorax is noted. No bony abnormality is noted. IMPRESSION: No pneumothorax following left thoracentesis. Electronically Signed   By: Inez Catalina M.D.   On: 10/03/2019 11:49    Anti-infectives: Anti-infectives (From admission, onward)   Start     Dose/Rate Route Frequency Ordered Stop   10/03/19 2000  cefTAZidime (FORTAZ) 1 g in sodium chloride 0.9 % 100 mL IVPB     Discontinue     1 g 200 mL/hr over 30  Minutes Intravenous Every 24 hours 10/02/19 1439     10/03/19 1200  vancomycin (VANCOCIN) IVPB 1000 mg/200 mL premix        1,000 mg 200 mL/hr over 60 Minutes Intravenous Every M-W-F (Hemodialysis) 10/03/19 0824 10/03/19 1637   10/02/19 1600  vancomycin (VANCOREADY) IVPB 2000 mg/400 mL        2,000 mg 200 mL/hr over 120 Minutes Intravenous  Once 10/02/19 1425 10/02/19 1922   10/02/19 1445  cefTAZidime (FORTAZ) 2 g in sodium chloride 0.9 % 100 mL IVPB        2 g 200 mL/hr over 30 Minutes Intravenous  Once 10/02/19 1439 10/02/19 1722   10/02/19 1428  vancomycin variable dose per unstable renal function (pharmacist dosing)     Discontinue      Does not apply See admin instructions 10/02/19 1429        Assessment/Plan: s/p * No surgery found * Advance diet  Continue abx for possible infection of pd cath Continue dressing changes Will need pd cath removed at some point but would like him to have some time to heal before having another surgery. Will follow  LOS: 3 days    Aaron [name] 10/04/2019

## 2019-10-04 NOTE — Progress Notes (Signed)
Pharmacy Antibiotic Note  Aaron [name] is a 64 y.o. male admitted on 10/01/2019 with peritonitis. Pharmacy has been consulted for Zosyn dosing. Patient previously on Vancomycin and Ceftazidime for Enterococcus and Serratia bacteremia. Patient with ESRD on peritoneal dialysis PTA now on HD MWF with plans to remove PD catheter.  -WBC= down to 13.3, Afebrile -peritoneal fluid- high neutrophil count  Plan: -Discontinue Vancomycin and Ceftazidime -Start Zosyn 2.25 g IV Q8H while on HD  -Follow-up cultures and any changes in HD plans   Weight: 90.8 kg (200 lb 2.8 oz)  Temp (24hrs), Avg:98 F (36.7 C), Min:97.5 F (36.4 C), Max:98.2 F (36.8 C)  Recent Labs  Lab 10/01/19 0643 10/01/19 0904 10/02/19 0219 10/02/19 0317 10/04/19 0222  WBC  --   --  30.5*  --  13.3*  CREATININE 5.74*  --  3.27*  --  3.01*  LATICACIDVEN 3.6* 3.4*  --  2.8*  --     Estimated Creatinine Clearance: 30.8 mL/min (A) (by C-G formula based on SCr of 3.01 mg/dL (H)).    No Known Allergies  Antimicrobials this admission: Zosyn 6/22>> Vanc 6/20>>6/22 [name] 6/20 >>6/22  Microbiology results: 6/22 peritoneal fluid > pending  6/19 peritoneal fluid> enterococcus Faec (sens amp), serratia (resistant to amp)  Thank you for allowing pharmacy to be a part of this patient's care.  Lorel Monaco, PharmD PGY1 Ambulatory Care Resident [employer] # [phone]  **Pharmacist phone directory can now be found on [email or web address] ([email or web address]).  Listed under [place].

## 2019-10-04 NOTE — Consult Note (Signed)
Hospital Consult    Reason for Consult:  Right foot ischemia Referring Physician:  Dr. Tamala Julian MRN #:  [id number]  History of Present Illness: This is a 64 y.o. male with h/o of R EIA and CFA and PFA and SFA endart with patch and stent of right CIA and DES of R SFA. This was performed with CO2. Now he is esrd.  He is admitted with atrial fibrillation has been started on Eliquis.  Recently underwent exporter laparotomy for small bowel obstruction.  Had been doing peritoneal dialysis prior to his surgery now his switch to HD.  Last night developed pain in his right great toe.  He has discoloration of this toe.  He also has chronic ulceration at the toe.  He denies any left foot pain  Past Medical History:  Diagnosis Date  . Anxiety   . Arthritis    knees , back , shoulders (10/12/2017)  . Atrial fibrillation ([place])   . CAD (coronary artery disease)    Mild nonobstructive disease at cardiac catheterization 2002  . Chronic back pain    "back of my neck; down thru my legs" (10/12/2017)  . COPD (chronic obstructive pulmonary disease) ([place])   . Diverticulitis   . Esophageal reflux   . ESRD (end stage renal disease) on dialysis [facility])    "TTS; [place]" (11/23/2017)  . Essential hypertension   . Hepatitis C    states he no longer has this  . History of kidney stones   . History of syncope   . Hyperlipidemia   . Jerking 09/23/2014  . Myocardial infarction ([place]) 02/2017   "light one" (10/12/2017)  . Non-compliant behavior    non compliant with diaylsis per daughter  . PAT (paroxysmal atrial tachycardia) ([place])   . Peripheral arterial disease ([place])    Occluded left superficial femoral artery status post stent June 2016 - Dr. Trula Slade  . Pneumonia 1961  . Polycystic kidney, unspecified type   . Syncope 09/2014  . SYNCOPE 05/07/2010   Qualifier: Diagnosis of  By: Laurance Flatten RN, BSN, Anderson Malta      Past Surgical History:  Procedure Laterality Date  . ABDOMINAL AORTOGRAM N/A 08/11/2017   Procedure:  ABDOMINAL AORTOGRAM;  Surgeon: Serafina Mitchell, MD;  Location: [place] [facility];  Service: Cardiovascular;  Laterality: N/A;  . ANTERIOR CERVICAL DECOMP/DISCECTOMY FUSION    . APPLICATION OF WOUND VAC Right 08/14/2017   Procedure: APPLICATION OF PREVENA INCISIONAL WOUND VAC RIGHT GROIN;  Surgeon: Serafina Mitchell, MD;  [facility]: [facility];  Service: Vascular;  Laterality: Right;  . AV FISTULA PLACEMENT Left 09/04/2017   Procedure: ARTERIOVENOUS (AV) FISTULA CREATION LEFT UPPER ARM;  Surgeon: Serafina Mitchell, MD;  Location: [place];  Service: Vascular;  Laterality: Left;  . BACK SURGERY    . BASCILIC VEIN TRANSPOSITION Left 11/20/2017   Procedure: SECOND STAGE BASILIC VEIN TRANSPOSITION LEFT ARM;  Surgeon: Serafina Mitchell, MD;  Location: [place];  Service: Vascular;  Laterality: Left;  . BIOPSY  12/17/2016   Procedure: BIOPSY;  Surgeon: Daneil Dolin, MD;  [facility];  Service: Gastroenterology;;  gastric colon  . BIOPSY  04/29/2017   Procedure: BIOPSY;  Surgeon: Daneil Dolin, MD;  [facility];  Service: Endoscopy;;  duodenal biopsies  . BUNIONECTOMY Bilateral   . CENTRAL VENOUS CATHETER INSERTION Right 08/29/2019   Procedure: Attempted Insertion [place] Adult;  Surgeon: Jesusita Oka, MD;  Location: [place];  Service: General;  Laterality: Right;  .  COLONOSCOPY  2008   Dr. Oneida Alar: rare sigmoid colon diverticulosis, internal hemorrhoids.   . COLONOSCOPY WITH PROPOFOL N/A 12/17/2016   dense left-sided diverticulosis, right colon ulcers s/p biopsy query occult NSAID use vs transient ischemia, not consistent with IBD. CMV stains negative.   Marland Kitchen ENDARTERECTOMY FEMORAL Right 08/14/2017   Procedure: RIGHT ILLIO-FEMORAL ENDARTERECTOMY;  Surgeon: Serafina Mitchell, MD;  Location: [facility] OR;  Service: Vascular;  Laterality: Right;  . ESOPHAGEAL DILATION  12/17/2016   EGD with mild Schatzki's ring s/p dilatation, small hiatal hernia, erosive gastropathy (negative H.pylori gastritis)  .  ESOPHAGOGASTRODUODENOSCOPY  2008   Dr. Oneida Alar: normal esophagus without Barrett's, antritis and duodenitis, path with H.pylori gastritis  . ESOPHAGOGASTRODUODENOSCOPY (EGD) WITH PROPOFOL N/A 12/17/2016   Procedure: ESOPHAGOGASTRODUODENOSCOPY (EGD) WITH PROPOFOL;  Surgeon: Daneil Dolin, MD;  [facility];  Service: Gastroenterology;  Laterality: N/A;  . ESOPHAGOGASTRODUODENOSCOPY (EGD) WITH PROPOFOL N/A 04/29/2017   Patchy erythema of gastric mucosa diffusely, extensive inflammatory changes in duodenum, geographic ulceration and mucosal edema present, encroaching somewhat on the lumen yet still widely patent, distal second portion of duodenum appeared abnormal, path with peptic duodenitis with ulceration  . GROIN DEBRIDEMENT Right 10/14/2017   Procedure: RIGHT GROIN AND RIGHT LOWER QUADRANT ABDOMEN DEBRIDEMENT WITH PLACEMENT OF ANTIBIOTIC BEADS;  Surgeon: Serafina Mitchell, MD;  Location: [place];  Service: Vascular;  Laterality: Right;  . HEMATOMA EVACUATION Left 12/06/2017   Procedure: EVACUATION HEMATOMA;  Surgeon: Waynetta Sandy, MD;  Location: [place];  Service: Vascular;  Laterality: Left;  . HERNIA REPAIR  6222   umbilical  . I & D EXTREMITY Right 09/21/2017   Procedure: IRRIGATION AND DEBRIDEMENT GROIN;  Surgeon: Elam Dutch, MD;  Location: [place];  Service: Vascular;  Laterality: Right;  . I & D EXTREMITY Left 11/23/2017   Procedure: Evacuation Hematoma LEFT UPPER ARM GRAFT;  Surgeon: Elam Dutch, MD;  Location: [place];  Service: Vascular;  Laterality: Left;  . INGUINAL HERNIA REPAIR Right 02/2017   [place]  . INSERTION OF DIALYSIS CATHETER Right 09/04/2017   Procedure: INSERTION OF TUNNELED  DIALYSIS CATHETER - RIGHT INTERNAL JUGULAR PLACEMENT;  Surgeon: Serafina Mitchell, MD;  Location: [place];  Service: Vascular;  Laterality: Right;  . INSERTION OF DIALYSIS CATHETER Right 07/26/2018   Procedure: INSERTION OF DIALYSIS CATHETER RIGHT INTERNAL JUGULAR;  Surgeon: Marty Heck, MD;  Location: [place];  Service: Vascular;  Laterality: Right;  . INSERTION OF ILIAC STENT Right 08/14/2017   Procedure: INSERTION OF RIGHT COMMON ILIAC STENT 61mm x 96mm x 130cm INSERTION OF RIGHT EXTERNAL ILIAC STENT 40mm x 56mm x 130cm INSERTION OF SUPERFICIAL FERMORAL ARTERY STENT 39mm x 61mm x 130cm;  Surgeon: Serafina Mitchell, MD;  Location: [place];  Service: Vascular;  Laterality: Right;  . IR FLUORO GUIDE CV LINE LEFT  08/31/2019  . IR FLUORO GUIDE CV LINE RIGHT  08/31/2017  . IR FLUORO GUIDE CV LINE RIGHT  08/30/2019  . IR REMOVAL TUN CV CATH W/O FL  05/28/2018  . IR REMOVAL TUN CV CATH W/O FL  03/07/2019  . IR US GUIDE VASC ACCESS LEFT  08/31/2019  . IR US GUIDE VASC ACCESS RIGHT  08/31/2017  . IR US GUIDE VASC ACCESS RIGHT  08/30/2019  . IRRIGATION AND DEBRIDEMENT ABDOMEN  09/05/2019   Procedure: Irrigation And Debridement Abdomen, Placement of retention sutures;  Surgeon: Donnie Mesa, MD;  Location: [place];  Service: General;;  . LAPAROTOMY N/A 08/29/2019  Procedure: EXPLORATORY LAPAROTOMY, REPAIR OF SEROSAL INJURY TIMES THREE;  Surgeon: Jesusita Oka, MD;  Location: [place];  Service: General;  Laterality: N/A;  . LAPAROTOMY N/A 09/05/2019   Procedure: EXPLORATORY LAPAROTOMY;  Surgeon: Donnie Mesa, MD;  Location: [place];  Service: General;  Laterality: N/A;  . LOWER EXTREMITY ANGIOGRAPHY Right 08/11/2017   Procedure: LOWER EXTREMITY ANGIOGRAPHY;  Surgeon: Serafina Mitchell, MD;  Location: [place] [facility];  Service: Cardiovascular;  Laterality: Right;  . LYSIS OF ADHESION  08/29/2019   Procedure: Lysis Of Adhesion;  Surgeon: Jesusita Oka, MD;  Location: [place];  Service: General;;  . PATCH ANGIOPLASTY Right 08/14/2017   Procedure: PATCH ANGIOPLASTY USING HEMASHIELD PATCH 0.3IN Lillie Columbia;  Surgeon: Serafina Mitchell, MD;  Location: [place] OR;  Service: Vascular;  Laterality: Right;  . PERIPHERAL VASCULAR CATHETERIZATION N/A 09/20/2014   Procedure: Abdominal Aortogram;  Surgeon: Serafina Mitchell, MD;  Location: [place] [facility];  Service: Cardiovascular;  Laterality: N/A;  . POSTERIOR FUSION LUMBAR SPINE    . TEE WITHOUT CARDIOVERSION N/A 01/12/2018   Procedure: TRANSESOPHAGEAL ECHOCARDIOGRAM (TEE);  Surgeon: Lelon Perla, MD;  Location[facility];  Service: Cardiovascular;  Laterality: N/A;    No Known Allergies  Prior to Admission medications   Medication Sig Start Date End Date Taking? Authorizing Provider  albuterol (PROVENTIL HFA;VENTOLIN HFA) 108 (90 Base) MCG/ACT inhaler Inhale 2 puffs into the lungs every 6 (six) hours as needed for wheezing or shortness of breath.   Yes [provider]  albuterol (PROVENTIL) (2.5 MG/3ML) 0.083% nebulizer solution Inhale 3 mLs into the lungs 3 (three) times daily as needed for shortness of breath or wheezing. 06/23/18  Yes [provider]  apixaban (ELIQUIS) 5 MG TABS tablet Take 1 tablet (5 mg total) by mouth 2 (two) times daily. 09/11/19  Yes Mercy Riding, MD  atorvastatin (LIPITOR) 80 MG tablet Take 80 mg by mouth daily. 04/25/19  Yes [provider]  BREO ELLIPTA 200-25 MCG/INH AEPB Inhale 1 puff into the lungs daily as needed (for respiratory issues.).  07/27/17  Yes [provider]  calcium acetate (PHOSLO) 667 MG capsule Take 667 mg by mouth 3 (three) times daily with meals. 06/29/18  Yes [provider]  Darbepoetin Alfa (ARANESP) 60 MCG/0.3ML SOSY injection Inject 0.3 mLs (60 mcg total) into the vein every Wednesday with hemodialysis. 09/14/19  Yes Mercy Riding, MD  diltiazem (CARDIZEM CD) 180 MG 24 hr capsule Take 1 capsule (180 mg total) by mouth daily. 09/28/19  Yes Mercy Riding, MD  docusate sodium (COLACE) 100 MG capsule Take 1 capsule (100 mg total) by mouth daily. 09/11/19  Yes Mercy Riding, MD  gabapentin (NEURONTIN) 300 MG capsule Take 1 capsule (300 mg total) by mouth at bedtime. 09/27/19  Yes Mercy Riding, MD  metoprolol tartrate (LOPRESSOR) 25 MG tablet Take 1 tablet  (25 mg total) by mouth 2 (two) times daily. 09/11/19  Yes Mercy Riding, MD  multivitamin (RENA-VIT) TABS tablet Take 1 tablet by mouth at bedtime. 09/11/19  Yes Mercy Riding, MD  ondansetron (ZOFRAN) 4 MG tablet Take 1 tablet (4 mg total) by mouth every 8 (eight) hours as needed for nausea. 09/11/19  Yes Mercy Riding, MD  oxyCODONE-acetaminophen (PERCOCET) 10-325 MG tablet Take 1 tablet by mouth 5 (five) times daily as needed for pain.    Yes [provider]  pantoprazole (PROTONIX) 40 MG tablet Take 1 tablet (40 mg  total) by mouth 2 (two) times daily. Patient taking differently: Take 40 mg by mouth daily.  04/30/17 01/27/26 Yes Shah, Pratik D, DO  senna-docusate (SENOKOT-S) 8.6-50 MG tablet Take 1 tablet by mouth 2 (two) times daily as needed for moderate constipation. 09/11/19  Yes Mercy Riding, MD  varenicline (CHANTIX) 0.5 MG tablet Take 0.5 mg by mouth 2 (two) times daily.   Yes [provider]    Social History   Socioeconomic History  . Marital status: Legally Separated    Spouse name: Not on file  . Number of children: 2  . Years of education: Not on file  . Highest education level: Not on file  Occupational History  . Occupation: DISABLED  Tobacco Use  . Smoking status: Current Every Day Smoker    Packs/day: 0.25    Years: 47.00    Pack years: 11.75    Types: Cigarettes    Last attempt to quit: 08/03/2017    Years since quitting: 2.1  . Smokeless tobacco: Never Used  Vaping Use  . Vaping Use: Never used  Substance and Sexual Activity  . Alcohol use: Not Currently    Comment: 10/12/2017 alcohol free since 2017,  heavy drinker in the past  . Drug use: Not Currently    Comment: "<2003 whatever was around; nothing since"  . Sexual activity: Not Currently  Other Topics Concern  . Not on file  Social History Narrative   Disabled from Back since 2007   Social Determinants of Health   Financial Resource Strain:   . Difficulty of Paying Living Expenses:     Food Insecurity:   . Worried About Charity fundraiser in the Last Year:   . Arboriculturist in the Last Year:   Transportation Needs:   . Film/video editor (Medical):   Marland Kitchen Lack of Transportation (Non-Medical):   Physical Activity:   . Days of Exercise per Week:   . Minutes of Exercise per Session:   Stress:   . Feeling of Stress :   Social Connections:   . Frequency of Communication with Friends and Family:   . Frequency of Social Gatherings with Friends and Family:   . Attends Religious Services:   . Active Member of Clubs or Organizations:   . Attends Archivist Meetings:   Marland Kitchen Marital Status:   Intimate Partner Violence:   . Fear of Current or Ex-Partner:   . Emotionally Abused:   Marland Kitchen Physically Abused:   . Sexually Abused:      Family History  Problem Relation Age of Onset  . Alcoholism Mother   . Heart disease Father        Massive heart attack  . Heart attack Father   . Atrial fibrillation Father   . Colon cancer Father   . Colon cancer Maternal Grandfather 24  . Alcoholism Maternal Grandfather   . Renal cancer Cousin   . Ovarian cancer Sister     ROS:  Cardiovascular: []  chest pain/pressure []  palpitations []  SOB lying flat []  DOE []  pain in legs while walking [x]  pain in legs at rest []  pain in legs at night []  non-healing ulcers []  hx of DVT []  swelling in legs  Pulmonary: []  productive cough []  asthma/wheezing []  home O2  Neurologic: []  weakness in []  arms []  legs []  numbness in []  arms []  legs []  hx of CVA []  mini stroke [] difficulty speaking or slurred speech []  temporary loss of vision in one eye []   dizziness  Hematologic: []  hx of cancer []  bleeding problems []  problems with blood clotting easily  Endocrine:   []  diabetes []  thyroid disease  GI []  vomiting blood []  blood in stool  GU: []  CKD/renal failure []  HD--[]  M/W/F or []  T/T/S []  burning with urination []  blood in urine  Psychiatric: []  anxiety []   depression  Musculoskeletal: []  arthritis [x]  toe pain   Integumentary: []  rashes [x]  ulcers  Constitutional: []  fever []  chills   Physical Examination  Vitals:   10/04/19 0800 10/04/19 0900  BP: 115/67 127/68  Pulse: 78   Resp: 16 15  Temp:    SpO2: 98%    Body mass index is 24.37 kg/m.  General: nad HENT: WNL, normocephalic Pulmonary: normal non-labored breathing Cardiac: Now normal sinus rhythm Right common femoral pulses 3+ palpable left common femoral pulses not palpable There is a strong dorsalis pedis signal on the right Abdomen:  soft, NT/ND, no masses Extremities: Right great toe does have purple discoloration with blotchy purple discoloration over the first metatarsal head on the dorsum of the foot the foot is warm and well-perfused.  There does appear to be toe tip ulceration on the right great toe Musculoskeletal: no muscle wasting or atrophy  Neurologic: A&O X 3   CBC    Component Value Date/Time   WBC 13.3 (H) 10/04/2019 0222   RBC 2.53 (L) 10/04/2019 0222   HGB 7.7 (L) 10/04/2019 0222   HCT 24.1 (L) 10/04/2019 0222   PLT 216 10/04/2019 0222   MCV 95.3 10/04/2019 0222   MCH 30.4 10/04/2019 0222   MCHC 32.0 10/04/2019 0222   RDW 16.8 (H) 10/04/2019 0222   LYMPHSABS 0.7 09/20/2019 1306   MONOABS 1.0 09/20/2019 1306   EOSABS 0.0 09/20/2019 1306   BASOSABS 0.0 09/20/2019 1306    BMET    Component Value Date/Time   NA 135 10/04/2019 0222   K 4.1 10/04/2019 0222   CL 99 10/04/2019 0222   CO2 26 10/04/2019 0222   GLUCOSE 96 10/04/2019 0222   BUN 27 (H) 10/04/2019 0222   CREATININE 3.01 (H) 10/04/2019 0222   CALCIUM 6.8 (L) 10/04/2019 0222   GFRNONAA 21 (L) 10/04/2019 0222   GFRAA 24 (L) 10/04/2019 0222    COAGS: Lab Results  Component Value Date   INR 1.6 (H) 10/01/2019   INR 1.0 05/26/2019   INR 0.9 07/26/2018     Non-Invasive Vascular Imaging:   Right ABI 0.72 and monophasic toe pressure is absent left side has no  signals  Right lower extremity duplex reviewed all flow from the external leg artery is monophasic there is a 75 to 99% stenosis in the SFA with peak systolic velocity 161.  There is total occlusion of the left common and profunda femoris arteries     ASSESSMENT/PLAN: This is a 64 y.o. male is here with A. fib with rapid ventricular rate on Eliquis.  Last Eliquis was this morning.  We will convert him to heparin drip.  He will need angiography to evaluate his right lower extremity given what appears to be acute on chronic ischemic pain of the great toe but he does have good signal in the dorsalis pedis with notable monophasic flow throughout the right lower extremity and stenosis in the right SFA.  He does have very strongly palpable common femoral pulse on the right.  The left side there is no palpable femoral pulse but he denies any left foot pain.  We will proceed with CT angio  to evaluate his bilateral lower extremities.  Will ultimately need angiographic evaluation of the right lower extremity may need to be from brachial approach.  Dimitrius Steedman C. Donzetta Matters, MD [facility] Office: [phone] Pager: [phone]

## 2019-10-04 NOTE — Evaluation (Signed)
Physical Therapy Evaluation Patient Details Name: Aaron [name] MRN: [id number] DOB: Feb 21, 1956 Today's Date: 10/04/2019   History of Present Illness  Patient is a 64 y/o male admitted with syncope during HD with respiratory arrest. PMHx: pt with ESRD normally on peritoneal dialysist but has been on HD since SBO s/p ex lap 5/17 with repeat 5/24, discitis, HLD, CAD, PAD, A-fib with RVR, HTN, COPD, CHF.  Clinical Impression  Pt pleasant and reports moving around well at home with RW with assist of friends and family as needed with wife (separated)checking in on him regularly and assisting with meds and meals. Pt with decreased circulation of RLE awaiting arteriogram and limited gait this session. Pt with decreased functional mobility, gait, strength and posture who will benefit from acute therapy to maximize mobility and independence to decrease burden of care.      Follow Up Recommendations Home health PT;Supervision for mobility/OOB    Equipment Recommendations  None recommended by PT    Recommendations for Other Services       Precautions / Restrictions Precautions Precautions: Fall Precaution Comments: abdominal incision, right foot discoloration await aortogram      Mobility  Bed Mobility Overal bed mobility: Modified Independent             General bed mobility comments: pt able to transition to EOB with HOB 15 degrees with use of rail  Transfers Overall transfer level: Needs assistance   Transfers: Sit to/from Stand Sit to Stand: Min guard         General transfer comment: cues for hand placement with assist to manage lines  Ambulation/Gait Ambulation/Gait assistance: Min guard Gait Distance (Feet): 40 Feet Assistive device: Rolling walker (2 wheeled) Gait Pattern/deviations: Step-through pattern;Decreased stride length;Trunk flexed   Gait velocity interpretation: 1.31 - 2.62 ft/sec, indicative of limited community ambulator General Gait Details: cues for  posture and proximity to RW. Limited gait due to impaired sensation of right foot  Stairs            Wheelchair Mobility    Modified Rankin (Stroke Patients Only)       Balance Overall balance assessment: Needs assistance   Sitting balance-Leahy Scale: Good     Standing balance support: Bilateral upper extremity supported;During functional activity Standing balance-Leahy Scale: Poor Standing balance comment: RW support for gait                             Pertinent Vitals/Pain Pain Score: 4  Pain Location: right foot Pain Descriptors / Indicators: Aching Pain Intervention(s): Limited activity within patient's tolerance;Repositioned    Home Living Family/patient expects to be discharged to:: Private residence Living Arrangements: Alone Available Help at Discharge: Family;Friend(s);Available 24 hours/day Type of Home: Apartment Home Access: Level entry     Home Layout: One level Home Equipment: Cane - single point;Grab bars - tub/shower;Grab bars - toilet;Bedside commode;Shower seat;Wheelchair - [employer] - 4 wheels;Walker - 2 wheels;Walker - standard;Other (comment) (lift chair that pt sleeps in)      Prior Function Level of Independence: Needs assistance   Gait / Transfers Assistance Needed: uses RW at home and does not leave the house without supervision, uses motorized cart in stores  ADL's / Homemaking Assistance Needed: wife assists with medication and meals, wife has been helping bathe 3x/wk  Comments: pt lives alone but has a friend spending the night with him. He and wife are separated but she checks on him for meals and  meds daily. Niece and daughter also check in on him     Hand Dominance        Extremity/Trunk Assessment   Upper Extremity Assessment Upper Extremity Assessment: Generalized weakness    Lower Extremity Assessment Lower Extremity Assessment: Generalized weakness;RLE deficits/detail RLE Deficits / Details:  discoloration of foot LLE Deficits / Details: wound on hallux    Cervical / Trunk Assessment Cervical / Trunk Assessment: Kyphotic  Communication   Communication: No difficulties  Cognition Arousal/Alertness: Awake/alert Behavior During Therapy: WFL for tasks assessed/performed Overall Cognitive Status: Within Functional Limits for tasks assessed                                        General Comments      Exercises General Exercises - Lower Extremity Long Arc Quad: AROM;Both;10 reps;Seated Hip Flexion/Marching: AROM;Both;Seated;10 reps   Assessment/Plan    PT Assessment Patient needs continued PT services  PT Problem List Decreased strength;Decreased balance;Decreased mobility;Decreased activity tolerance;Decreased knowledge of use of DME;Cardiopulmonary status limiting activity;Decreased range of motion;Decreased skin integrity;Pain;Impaired sensation       PT Treatment Interventions DME instruction;Gait training;Functional mobility training;Therapeutic exercise;Therapeutic activities;Balance training;Patient/family education    PT Goals (Current goals can be found in the Care Plan section)  Acute Rehab PT Goals Patient Stated Goal: return home PT Goal Formulation: With patient Time For Goal Achievement: 10/18/19 Potential to Achieve Goals: Fair    Frequency Min 3X/week   Barriers to discharge Decreased caregiver support      Co-evaluation               AM-PAC PT "6 Clicks" Mobility  Outcome Measure Help needed turning from your back to your side while in a flat bed without using bedrails?: A Little Help needed moving from lying on your back to sitting on the side of a flat bed without using bedrails?: A Little Help needed moving to and from a bed to a chair (including a wheelchair)?: A Little Help needed standing up from a chair using your arms (e.g., wheelchair or bedside chair)?: A Little Help needed to walk in hospital room?: A  Little Help needed climbing 3-5 steps with a railing? : A Little 6 Click Score: 18    End of Session   Activity Tolerance: Patient tolerated treatment well Patient left: in chair;with call bell/phone within reach;with chair alarm set Nurse Communication: Mobility status PT Visit Diagnosis: Other abnormalities of gait and mobility (R26.89);Muscle weakness (generalized) (M62.81);Difficulty in walking, not elsewhere classified (R26.2);Pain Pain - Right/Left: Right Pain - part of body: Ankle and joints of foot    Time: 7096-2836 PT Time Calculation (min) (ACUTE ONLY): 21 min   Charges:   PT Evaluation $PT Eval Moderate Complexity: 1 Mod          Waverly Tarquinio P, PT Acute Rehabilitation Services Pager: [phone] Office: [phone]   Bayne Fosnaugh B Coutney Wildermuth 10/04/2019, 1:25 PM

## 2019-10-04 NOTE — Progress Notes (Signed)
NAME:  Aaron [name], MRN:  [id number], DOB:  26-Apr-1955, LOS: 3 ADMISSION DATE:  10/01/2019, CONSULTATION DATE:  10/04/19 REFERRING MD:  Mercer Pod, CHIEF COMPLAINT:  hypotension   Brief History   64 y/o M with ESRD and Atrial fibrillation who became light-headed at dialysis on 6/18 and had a syncopal episode with reported respiratory arrest .  He went to [facility] ED and was transferred to [facility] for dialysis.  En route, was started on cardizem gtt and BP dropped.  PCCM consulted for hypotension.   History of present illness   64 y.o. M with PMH of ESRD and permanent Atrial fibrillation on Eliquis who was recently admitted for small bowel obstruction and is status post ex lap with retention sutures who was at dialysis on 6/18 when he became dizzy.  Per report he had a syncopal episode and respiratory arrest and was sent to [facility] emergency department.  His lactic acid was elevated to 6.6, no significant WBC or source of infection.  He reports continued abdominal pain that is stable since his surgery with one episode of diarrhea yesterday.  Pt states that he usually does peritoneal dialysis, but has had to do HD since his abdominal surgery.  He denies fever, chills, nausea, vomiting, chest pain or palpitations or any other infectious symptoms.   He was to be transferred to [name] to the hospitalist service when he developed RVR en route and was started on cardizem.  His BP then dropped and he was started on Neo, so was transferred to [facility].   Past Medical History   has a past medical history of Anxiety, Arthritis, Atrial fibrillation ([place]), CAD (coronary artery disease), Chronic back pain, COPD (chronic obstructive pulmonary disease) ([place]), Diverticulitis, Esophageal reflux, ESRD (end stage renal disease) on dialysis [facility]), Essential hypertension, Hepatitis C, History of kidney stones, History of syncope, Hyperlipidemia, Jerking (09/23/2014), Myocardial infarction ([place]) (02/2017), Non-compliant  behavior, PAT (paroxysmal atrial tachycardia) ([place]), Peripheral arterial disease ([place]), Pneumonia (1961), Polycystic kidney, unspecified type, Syncope (09/2014), and SYNCOPE (05/07/2010).   Significant Hospital Events   6/18 Presented to [facility] 6/19 Transfer to [facility]  Consults:  Cardiology Nephrology  Procedures:       Significant Diagnostic Tests:    Micro Data:  6/18 Sars-Cov-2>>negative 6/19 peritoneal fluid >>  Antimicrobials:  6/20 vancomycin >>  Interim history/subjective:  Feeling better from breathing standpoint but R foot started hurting this AM with increased discoloration.  Pulses dopplerable today, were palpable yesterday.  Objective   Blood pressure 127/68, pulse 78, temperature 98 F (36.7 C), temperature source Oral, resp. rate 15, weight 90.8 kg, SpO2 98 %.        Intake/Output Summary (Last 24 hours) at 10/04/2019 3532 Last data filed at 10/03/2019 2016 Gross per 24 hour  Intake 684 ml  Output 2100 ml  Net -1416 ml   Filed Weights   10/03/19 1300 10/03/19 1643 10/04/19 0500  Weight: 92.8 kg 90.9 kg 90.8 kg     GEN: chronically ill man laying in bed HEENT: temporal wasting CV: Irregular, ext warm PULM: More clear, occasional rhonci GI: Protuberant, PD catheter in place, incision site CDI EXT: 3+ edema, R 1st/2nd toe discolored and painful to touch NEURO: Moves alll 4 ext to command PSYCH: RASS 0 SKIN: No rashes  Mag>>replete  Resolved Hospital Problem list     Assessment & Plan:    Syncopal Episode with hypotension Suspected related to peritonitis and relative dehydration - Resolved, on a midodrine trial, to had trial of  iHD today.  Atrial Fibrillation/RVR Rate controlled with Metoprolol and cardizem at home -Per report, pt had a significant BP drop when Cardizem gtt initiated, now stopped  -Continue eliquis, cardizem, hold BB for time being  Lactic acidosis, recent SBO s/p ex lap bowel resection , resolving Peritonitis with  indwelling PD catheter -Defer to nephrology discussion with general surgery on if/when PD catheter should come out, if removed I would favor   ESRD Per pt was doing peritoneal dialysis until his recent surgery, now getting HD through [facility] -iHD per nephrology  L transudative effusion, wheezing, DOE, increased sputum c/w AECOPD-  - short course of prednisone, flutter, IS, nebs  R foot discomfort- in setting of prior R ileofemoral endarterectomy 2019, will get arterial dopplers and ask vascular to evaluate  Best practice:  Diet: Soft Pain/Anxiety/Delirium protocol (if indicated): Percocet VAP protocol (if indicated): n/a DVT prophylaxis: eliquis GI prophylaxis: N/A Glucose control: N/A Mobility: up to chair Code Status: Full code, but pt states he would not want to stay on life support for any extended amount of time Family Communication: updated patient Disposition: to tele after vascular evaluation, appreciate [facility] taking over care  Erskine Emery MD

## 2019-10-04 NOTE — Plan of Care (Signed)
  Problem: Education: Goal: Knowledge of General Education information will improve Description: Including pain rating scale, medication(s)/side effects and non-pharmacologic comfort measures Outcome: Progressing   Problem: Health Behavior/Discharge Planning: Goal: Ability to manage health-related needs will improve Outcome: Progressing   Problem: Clinical Measurements: Goal: Ability to maintain clinical measurements within normal limits will improve Outcome: Progressing Goal: Respiratory complications will improve Outcome: Progressing   Problem: Activity: Goal: Risk for activity intolerance will decrease Outcome: Progressing   Problem: Nutrition: Goal: Adequate nutrition will be maintained Outcome: Progressing   Problem: Coping: Goal: Level of anxiety will decrease Outcome: Progressing   Problem: Elimination: Goal: Will not experience complications related to bowel motility Outcome: Progressing Goal: Will not experience complications related to urinary retention Outcome: Progressing   Problem: Pain Managment: Goal: General experience of comfort will improve Outcome: Progressing   Problem: Safety: Goal: Ability to remain free from injury will improve Outcome: Progressing   Problem: Education: Goal: Knowledge of treatment and prevention of peritonitis will improve Outcome: Progressing   Problem: Health Behavior/Discharge Planning: Goal: Ability to manage health-related needs will improve Outcome: Progressing   Problem: Pain Management: Goal: Pain related to peritonitis will be minimal Outcome: Progressing   Problem: Clinical Measurements: Goal: Complications related to peritonitis or its treatment will be avoided or minimized Outcome: Progressing   Problem: Nutritional: Goal: Ability to make appropriate dietary choices will improve Outcome: Progressing   Problem: Bowel/Gastric: Goal: Complications associated with peritonitis will be avoided or  minimized Outcome: Progressing   Problem: Respiratory: Goal: Respiratory symptoms related to disease process will be avoided Outcome: Progressing   Problem: Coping: Goal: Ability to cope with  disease diagnosis and complications will improve Outcome: Progressing   Problem: Clinical Measurements: Goal: Will remain free from infection Outcome: Not Progressing Goal: Diagnostic test results will improve Outcome: Not Progressing Goal: Cardiovascular complication will be avoided Outcome: Not Progressing   Problem: Skin Integrity: Goal: Risk for impaired skin integrity will decrease Outcome: Not Progressing   Problem: Skin Integrity: Goal: PD catheter exit site will improve or remain free of signs and symptoms of infection Outcome: Not Progressing   Problem: Fluid Volume: Goal: Fluid volume balance will be maintained or improved Outcome: Not Progressing

## 2019-10-04 NOTE — Consult Note (Signed)
[facility] for Infectious Disease    Date of Admission:  10/01/2019     Total days of antibiotics 3  Day 3 vancomycin   Day 3 ceftaz               Reason for Consult: Peritonitis    Referring Provider: [facility] Primary Care Provider: [facility], [place]     ASSESSMENT:  Aaron [name] is a 64 y.o. male with ESRD previously on peritoneal dialysis now s/p intraabdominal surgery for SBO and revision for wound dehiscence. Initial cell count on peritoneal fluid 5,314 cells (94% neutrophils) and is growing enterococcus faecalis and serratia marcescens. It is a bit out of character that peritonitis related to PD catheter is polymicrobial. Would obtain CT scan of the abdomen and pelvis to ensure no abscess.  Will expand antibiotics a bit to zosyn to include anaerobes given concern over intra-abdominal/pelvic process. This will also cover the known recovered pathogens.   Thoracentesis appeared c/w transudate - grossly volume overloaded with pitting edema throughout abdomen and thighs.   R Foot ischemia - vascular taking for aortogram intervention tomorrow  Has a history of Hepatitis C and Hepatitis B - both cleared with negative Hep C RNA in 02-2017.    Plan: 1. Change IV antibiotics to zosyn alone 2. Please check abdominal/pelvic CT scan to rule out abscess 3. Follow for further surgical recommendations 4. Further recs to follow     Active Problems:   Septic shock ([place])   Shortness of breath   Atrial fibrillation (HCC)   S/P thoracentesis   . atorvastatin  80 mg Oral Daily  . Chlorhexidine Gluconate Cloth  6 each Topical Q0600  . Chlorhexidine Gluconate Cloth  6 each Topical Q0600  . diltiazem  180 mg Oral Daily  . gabapentin  300 mg Oral QHS  . gentamicin cream  1 application Topical Daily  . midodrine  10 mg Oral TID WC  . predniSONE  40 mg Oral Q breakfast  . vancomycin variable dose per unstable renal function (pharmacist dosing)   Does  not apply See admin instructions    HPI: Aaron [name] is a 64 y.o. male admitted from dialysis center following syncope and respiratory arrest on 10/01/2019.   He has a pmhx including ESRD on peritoneal dialysis, atrial fibrillation on Eliquis, diastolic CHF, COPD.   08/29/2019 underwent exploratory laparotomy with Dr. Bobbye Morton for SBO--> lysis of adhesions, small bowel serosal repair x 3, resection of mesenteric nodule.  Returned for revision 5/24 for partial abdominal wall closure and placement of retention sutures due to partial fascial dehiscence. No purulence was encountered at this time.  During this admission he had a tunneled HD line placed in IR given his PD catheter was not able to be used with recent bowel surgery. He has since continued to undergo HD sessions while he heals up from surgeries. He also states that his abdominal wound has started healing up nicely and had some sutures removed recently.   He states that about a week prior to current admission on 6/19 he experienced diarrhea and some abdominal pain he attributed to "someone flushing the PD catheter the wrong way." He states his abdominal pain has improved since starting on IV antibiotics during current admission. He does have "something wrong with his right leg" that he states he has an intervention planned tomorrow to address. Has a spot on his toes that has been present for unknown time. No  injury. His foot started hurting and throbbing last night.    Review of Systems: Review of Systems  Constitutional: Negative for chills, fever and malaise/fatigue.  Respiratory: Positive for cough and shortness of breath.   Cardiovascular: Negative for chest pain.  Gastrointestinal: Positive for abdominal pain (improving).  Genitourinary:       Anuric  Musculoskeletal: Negative for back pain and joint pain.  Skin: Negative for rash.  Neurological: Negative for dizziness and headaches.    Past Medical History:  Diagnosis Date  .  Anxiety   . Arthritis    knees , back , shoulders (10/12/2017)  . Atrial fibrillation ([place])   . CAD (coronary artery disease)    Mild nonobstructive disease at cardiac catheterization 2002  . Chronic back pain    "back of my neck; down thru my legs" (10/12/2017)  . COPD (chronic obstructive pulmonary disease) ([place])   . Diverticulitis   . Esophageal reflux   . ESRD (end stage renal disease) on dialysis [facility])    "TTS; [place]" (11/23/2017)  . Essential hypertension   . Hepatitis C    states he no longer has this  . History of kidney stones   . History of syncope   . Hyperlipidemia   . Jerking 09/23/2014  . Myocardial infarction ([place]) 02/2017   "light one" (10/12/2017)  . Non-compliant behavior    non compliant with diaylsis per daughter  . PAT (paroxysmal atrial tachycardia) ([place])   . Peripheral arterial disease ([place])    Occluded left superficial femoral artery status post stent June 2016 - Dr. Trula Slade  . Pneumonia 1961  . Polycystic kidney, unspecified type   . Syncope 09/2014  . SYNCOPE 05/07/2010   Qualifier: Diagnosis of  By: Laurance Flatten RN, BSN, Anderson Malta      Social History   Tobacco Use  . Smoking status: Current Every Day Smoker    Packs/day: 0.25    Years: 47.00    Pack years: 11.75    Types: Cigarettes    Last attempt to quit: 08/03/2017    Years since quitting: 2.1  . Smokeless tobacco: Never Used  Vaping Use  . Vaping Use: Never used  Substance Use Topics  . Alcohol use: Not Currently    Comment: 10/12/2017 alcohol free since 2017,  heavy drinker in the past  . Drug use: Not Currently    Comment: "<2003 whatever was around; nothing since"    Family History  Problem Relation Age of Onset  . Alcoholism Mother   . Heart disease Father        Massive heart attack  . Heart attack Father   . Atrial fibrillation Father   . Colon cancer Father   . Colon cancer Maternal Grandfather 9  . Alcoholism Maternal Grandfather   . Renal cancer Cousin   . Ovarian cancer Sister     No Known Allergies  OBJECTIVE: Blood pressure 111/66, pulse (!) 42, temperature 98.1 F (36.7 C), temperature source Oral, resp. rate (!) 26, weight 90.8 kg, SpO2 98 %.  Physical Exam Vitals reviewed.  Constitutional:      Comments: Sitting upright in bed. No distress. Pleasant.   HENT:     Mouth/Throat:     Mouth: No oral lesions.     Dentition: Normal dentition. No dental caries.  Eyes:     General: No scleral icterus. Cardiovascular:     Rate and Rhythm: Normal rate and regular rhythm.     Heart sounds: Normal heart sounds.  Pulmonary:  Effort: Pulmonary effort is normal.     Breath sounds: Normal breath sounds.  Abdominal:     General: There is no distension.     Palpations: Abdomen is soft.     Tenderness: There is no abdominal tenderness.     Comments: Distended with generalized anasarca that is pitting.  Midline abdominal incision is clean and dry. PD catheter site is unremarkable with clean/dry dressing.   Musculoskeletal:     Comments: RLE mottled and cool.   Lymphadenopathy:     Cervical: No cervical adenopathy.  Skin:    General: Skin is warm and dry.     Findings: No rash.  Neurological:     Mental Status: He is alert and oriented to person, place, and time.     Lab Results Lab Results  Component Value Date   WBC 13.3 (H) 10/04/2019   HGB 7.7 (L) 10/04/2019   HCT 24.1 (L) 10/04/2019   MCV 95.3 10/04/2019   PLT 216 10/04/2019    Lab Results  Component Value Date   CREATININE 3.01 (H) 10/04/2019   BUN 27 (H) 10/04/2019   NA 135 10/04/2019   K 4.1 10/04/2019   CL 99 10/04/2019   CO2 26 10/04/2019    Lab Results  Component Value Date   ALT 16 10/04/2019   AST 34 10/04/2019   ALKPHOS 272 (H) 10/04/2019   BILITOT 0.6 10/04/2019      Microbiology: Recent Results (from the past 240 hour(s))  MRSA PCR Screening     Status: None   Collection Time: 10/01/19  8:18 AM   Specimen: Nasal Mucosa; Nasopharyngeal  Result Value Ref Range Status    MRSA by PCR NEGATIVE NEGATIVE Final    Comment:        The GeneXpert MRSA Assay (FDA approved for NASAL specimens only), is one component of a comprehensive MRSA colonization surveillance program. It is not intended to diagnose MRSA infection nor to guide or monitor treatment for MRSA infections. Performed at [facility], [place] [address], [place], [place] [address]   Body fluid culture     Status: None   Collection Time: 10/01/19  1:20 PM   Specimen: Body Fluid  Result Value Ref Range Status   Specimen Description FLUID PERITONEAL DIALYSIS  Final   Special Requests Immunocompromised  Final   Gram Stain   Final    RARE WBC PRESENT, PREDOMINANTLY PMN NO ORGANISMS SEEN Performed at [facility], [address] [place], [place] [address]    Culture   Final    FEW ENTEROCOCCUS FAECALIS FEW SERRATIA MARCESCENS    Report Status 10/04/2019 FINAL  Final   Organism ID, Bacteria ENTEROCOCCUS FAECALIS  Final   Organism ID, Bacteria SERRATIA MARCESCENS  Final      Susceptibility   Enterococcus faecalis - MIC*    AMPICILLIN <=2 SENSITIVE Sensitive     VANCOMYCIN 1 SENSITIVE Sensitive     GENTAMICIN SYNERGY RESISTANT Resistant     * FEW ENTEROCOCCUS FAECALIS   Serratia marcescens - MIC*    CEFAZOLIN >=64 RESISTANT Resistant     CEFEPIME <=0.12 SENSITIVE Sensitive     CEFTAZIDIME <=1 SENSITIVE Sensitive     CEFTRIAXONE <=0.25 SENSITIVE Sensitive     CIPROFLOXACIN <=0.25 SENSITIVE Sensitive     GENTAMICIN <=1 SENSITIVE Sensitive     TRIMETH/SULFA <=20 SENSITIVE Sensitive     * FEW SERRATIA MARCESCENS  Body fluid culture (includes gram stain)     Status: None (Preliminary  result)   Collection Time: 10/03/19  2:25 PM   Specimen: Pleural Fluid  Result Value Ref Range Status   Specimen Description FLUID PLEURAL LEFT  Final   Special Requests NONE  Final   Gram Stain   Final    WBC PRESENT,BOTH PMN AND MONONUCLEAR NO ORGANISMS SEEN CYTOSPIN SMEAR    Culture   Final     NO GROWTH < 24 HOURS Performed at [facility], [address], [place], [place] [address]    Report Status PENDING  Incomplete     Janene Madeira, MSN, NP-C [place] [email or web address] [facility] [email or web address] Pager: [phone] Office: [phone] [place]: [phone]

## 2019-10-04 NOTE — Progress Notes (Signed)
Aaron [name], Aaron [name],  64 y.o. Male with PMH of ESRD on peritoneal [name], Aaron [name] with retention sutures  was at [name] on 6/18 when he became dizzy and had syncopal episode and respiratory arrest.  He was then sent to [facility] emergency department with he was noted to have elevated lactate at 6.6.  He was then transferred to [facility] when en route developed atrial fibrillation with rapid ventricular response and was given Cardizem iv which dropped his blood pressure.  He was then started on Neo-Synephrine and was admitted to the ICU.   Assessment/Plan:  Active Problems:   Septic shock (HCC)   Shortness of breath   Atrial fibrillation (HCC)   S/P thoracentesis  Syncopal Episode with hypotension Possibly secondary to peritonitis, volume depletion.  Currently off pressors. On midodrine trial.  2D echocardiogram on 05/27/2019 was 50 to 55%.  Atrial Fibrillation/RVR On presentation.  Seen by cardiology.  On Cardizem and Eliquis.  Beta-blockers on hold, could be restarted as needed.  Better control at this time.  Cardiology recommending outpatient follow-up with elective cardioversion after minimum of 3 weeks of anticoagulation if needed.  Follow-up recommended with Dr. Denman George [facility].  Etiology has signed off at this time.  Lactic acidosis, recent SBO s/p ex [name] bowel resection , resolving Suspect peritonitis, septic shock  On IV vancomycin and ceftazidime.  Nephrology and general surgery on board .  Surgery considering interval removal of peritoneal catheter.  Peritoneal fluid cultures showing Enterococcus and Proteus.  Get ID consultation.  Communicated with Dr. Megan Salon  ESRD Per pt was doing  peritoneal [name] until his recent surgery, now getting HD through left internal jugular [name] catheter M/W/F.  Continue hemodialysis as per nephrology.  Left-sided pleural effusion, dyspnea on exertion dyspnea.  Pulmonary has started on short course of prednisone.  Status post left-sided thoracocentesis on 10/03/2019 with removal of 1200 mL of straw-colored fluid.  Appears to be transudative at this time.   Debility, deconditioning.  Physical therapy has been consulted.  Pending evaluation.  Right foot discomfort.  Aaron of right iliofemoral endarterectomy.  Vascular surgery to evaluate the patient.  DVT prophylaxis: SCDs Start: 10/01/19 0607   Code Status: Full code  Family Communication: Spoke with the patient's daughter Ms. Whitney on the phone and updated her about the clinical condition of the patient.  Status is: Inpatient  Remains inpatient appropriate because: Unsafe d/c plan, IV treatments appropriate due to intensity of illness or inability to take PO, Inpatient level of care appropriate due to severity of illness, ongoing hemodialysis, vascular surgery evaluation for right foot discomfort, infectious disease consultation   Dispo: The patient is from: Home              Anticipated d/c is to: Likely home with home health.   PT evaluation-pending, pending vascular consultation/ID consultation              Anticipated d/c date is: 2 days              Patient currently is not medically stable to d/c.  Consultants:  PCCM,   general surgery,   Nephrology  Vascular surgery  ID consultation  Procedures:  Hemodialysis   Thoracocentesis left on 10/03/19  Antibiotics:  .  Ceftazidime, vancomycin iv 6/20>  Anti-infectives (From admission, onward)   Start     Dose/Rate Route Frequency Ordered Stop   10/03/19 2000  cefTAZidime (FORTAZ) 1 g in sodium chloride 0.9 % 100 mL IVPB     Discontinue     1 g 200 mL/hr over 30 Minutes Intravenous Every 24 hours 10/02/19 1439      10/03/19 1200  vancomycin (VANCOCIN) IVPB 1000 mg/200 mL premix        1,000 mg 200 mL/hr over 60 Minutes Intravenous Every M-W-F (Hemodialysis) 10/03/19 0824 10/03/19 1637   10/02/19 1600  vancomycin (VANCOREADY) IVPB 2000 mg/400 mL        2,000 mg 200 mL/hr over 120 Minutes Intravenous  Once 10/02/19 1425 10/02/19 1922   10/02/19 1445  cefTAZidime (FORTAZ) 2 g in sodium chloride 0.9 % 100 mL IVPB        2 g 200 mL/hr over 30 Minutes Intravenous  Once 10/02/19 1439 10/02/19 1722   10/02/19 1428  vancomycin variable dose per unstable renal function (pharmacist dosing)     Discontinue      Does not apply See admin instructions 10/02/19 1429       Subjective: Today, patient was seen and examined at bedside.  Notes a mild right foot discomfort.  Denies any cough chest pain palpitation.  No fever or chills.  Objective: Vitals:   10/04/19 0800 10/04/19 0900  BP: 115/67 127/68  Pulse: 78   Resp: 16 15  Temp:    SpO2: 98%     Intake/Output Summary (Last 24 hours) at 10/04/2019 1023 Last data filed at 10/03/2019 2016 Gross per 24 hour  Intake 684 ml  Output 2100 ml  Net -1416 ml   Filed Weights   10/03/19 1300 10/03/19 1643 10/04/19 0500  Weight: 92.8 kg 90.9 kg 90.8 kg   Body mass index is 24.37 kg/m.   Physical Exam: GENERAL: Patient is alert awake and oriented. Not in obvious distress.  Chronically ill, thinly built. HENT: No scleral pallor or icterus. Pupils equally reactive to light. Oral mucosa is moist NECK: is supple, no gross swelling noted. CHEST: Diminished breath sounds over the bases, right chest wall hemodialysis catheter in place. CVS: S1 and S2 heard, no murmur. Regular rate and rhythm.  ABDOMEN: Soft, non-tender, bowel sounds are present.  Incisional would dressing in place, peritoneal [name] catheter in place. EXTREMITIES: lower extremity edema, right foot mottling. CNS: Cranial nerves are intact. No focal motor deficits. SKIN: warm and dry , incisional  wound dressing on the abdomen  Data Review: I have personally reviewed the following laboratory data and studies,  CBC: Recent Labs  Lab 10/02/19 0219 10/04/19 0222  WBC 30.5* 13.3*  HGB 7.7* 7.7*  HCT 24.1* 24.1*  MCV 95.3 95.3  PLT 249 564   Basic Metabolic Panel: Recent Labs  Lab 10/01/19 0643 10/02/19 0219 10/04/19 0222  NA 134* 136 135  K 5.4* 3.6 4.1  CL 98 100 99  CO2 22 27 26   GLUCOSE 84 103* 96  BUN 38* 23 27*  CREATININE 5.74* 3.27* 3.01*  CALCIUM 6.6* 7.1* 6.8*  MG 1.3* 1.6* 1.4*  PHOS 4.5 3.4 2.6   Liver Function Tests: Recent Labs  Lab 10/01/19 0643 10/04/19 0222  AST 52* 34  ALT 21 16  ALKPHOS 151* 272*  BILITOT 0.9 0.6  PROT 5.0* 5.1*  ALBUMIN 1.4* 1.5*   No results for input(s): LIPASE, AMYLASE in the last 168 hours. No results for input(s): AMMONIA  in the last 168 hours. Cardiac Enzymes: No results for input(s): CKTOTAL, CKMB, CKMBINDEX, TROPONINI in the last 168 hours. BNP (last 3 results) Recent Labs    05/26/19 1542 08/22/19 1930 10/01/19 0643  BNP 322.8* 1,578.0* 3,857.9*    ProBNP (last 3 results) No results for input(s): PROBNP in the last 8760 hours.  CBG: Recent Labs  Lab 10/03/19 1506 10/03/19 1938 10/04/19 0011 10/04/19 0342 10/04/19 0638  GLUCAP 87 115* 107* 89 95   Recent Results (from the past 240 hour(s))  MRSA PCR Screening     Status: None   Collection Time: 10/01/19  8:18 AM   Specimen: Nasal Mucosa; Nasopharyngeal  Result Value Ref Range Status   MRSA by PCR NEGATIVE NEGATIVE Final    Comment:        The GeneXpert MRSA Assay (FDA approved for NASAL specimens only), is one component of a comprehensive MRSA colonization surveillance program. It is not intended to diagnose MRSA infection nor to guide or monitor treatment for MRSA infections. Performed at [facility], [place] [address] [place], [place] [address]   Body fluid culture     Status: None (Preliminary result)   Collection Time:  10/01/19  1:20 PM   Specimen: Body Fluid  Result Value Ref Range Status   Specimen Description FLUID PERITONEAL [name]  Final   Special Requests Immunocompromised  Final   Gram Stain   Final    RARE WBC PRESENT, PREDOMINANTLY PMN NO ORGANISMS SEEN    Culture   Final    FEW ENTEROCOCCUS FAECALIS FEW SERRATIA MARCESCENS SUSCEPTIBILITIES TO FOLLOW Performed at [facility], [place] [address], [place], [place] [address]    Report Status PENDING  Incomplete  Body fluid culture (includes gram stain)     Status: None (Preliminary result)   Collection Time: 10/03/19  2:25 PM   Specimen: Pleural Fluid  Result Value Ref Range Status   Specimen Description FLUID PLEURAL LEFT  Final   Special Requests NONE  Final   Gram Stain   Final    WBC PRESENT,BOTH PMN AND MONONUCLEAR NO ORGANISMS SEEN CYTOSPIN SMEAR    Culture   Final    NO GROWTH < 24 HOURS Performed at [facility], [place] [address], [place], [place] [address]    Report Status PENDING  Incomplete     Studies: DG Chest 1 View  Result Date: 10/03/2019 CLINICAL DATA:  Status post thoracentesis EXAM: CHEST  1 VIEW COMPARISON:  10/01/2018 FINDINGS: Cardiac shadow is stable. [name] catheter is again seen. Small right pleural effusion is again noted. Left effusion is been reduced following thoracentesis. No pneumothorax is noted. No bony abnormality is noted. IMPRESSION: No pneumothorax following left thoracentesis. Electronically Signed   By: Inez Catalina M.D.   On: 10/03/2019 11:49      Flora Lipps, MD  [facility] 10/04/2019

## 2019-10-04 NOTE — Progress Notes (Addendum)
ABI and bilateral lower extremity arterial duplex completed. Refer to "CV Proc" under chart review to view preliminary results.  Preliminary results discussed with Dr. Donzetta Matters.  10/04/2019 10:49 AM Kelby Aline., MHA, RVT, RDCS, RDMS

## 2019-10-04 NOTE — Progress Notes (Signed)
  [facility] Progress Note   Assessment/ Plan:   OP HD:MWF [facility] 4h 400/600 RIJ TDC 90kg Hep none - epo 2400 tiw - Fe 50mg / wk  Assessment/ Plan: 1. Syncope/ resp arrest - no further episodes , on nasal O2.  Off pressors now and stable hemodynamically.   2. PD cath peritonitis - TNC 5314, 94% pmn's. WBC 30k--> 13.3 with treatment.  Needs abx to cover gram+ and gram- given complication of his abd wound, plan IV vanc/ fortaz per pharm. Not sure if PD cath is exposed in the wound, appreciate gen surg input,  Not seen on MD eval.  Will treat for now, would like midline wound to heal more before PD cath removal. Repeat cell ct and culture ordered, have discussed with RN, will do today.  Cultures from 6/19 growing enterococcus and Serratia.   3. ESRD - on HD using TDC for now. HD on schedule MWF  4. Hyperkalemia - resolved.  5. Abd wound - slow healing wound after SBO surgery x 2 during May hospital stay 6. COPD - per primary 7. BP/ vol - off pressor this am, 2kg up , started midodrine here for BP support temporarily, continue for now 8. 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  This AM complains of bilateral foot pain.  RN dopplering pulses.  R toes are blue and cool, L great toe dressed.    Objective:   BP 127/68   Pulse 78   Temp 98 F (36.7 C) (Oral)   Resp 15   Wt 90.8 kg   SpO2 98%   BMI 24.37 kg/m   Physical Exam: Gen: NAD, sitting in bed CVS: RRR Resp: clear Abd: distended, soft, nontender, midline wound dressed, PD cath RLQ dressed Ext: 2+ ankle, R great toe and R 5th toe blue and cool, some mottling on sole of foot too. ACCESS: PD cath, L IJ [facility]  Labs: BMET Recent Labs  Lab 10/01/19 0643 10/02/19 0219 10/04/19 0222  NA 134* 136 135  K 5.4* 3.6 4.1  CL 98 100 99  CO2 22 27 26   GLUCOSE 84 103*  96  BUN 38* 23 27*  CREATININE 5.74* 3.27* 3.01*  CALCIUM 6.6* 7.1* 6.8*  PHOS 4.5 3.4 2.6   CBC Recent Labs  Lab 10/02/19 0219 10/04/19 0222  WBC 30.5* 13.3*  HGB 7.7* 7.7*  HCT 24.1* 24.1*  MCV 95.3 95.3  PLT 249 216      Medications:    . atorvastatin  80 mg Oral Daily  . Chlorhexidine Gluconate Cloth  6 each Topical Q0600  . Chlorhexidine Gluconate Cloth  6 each Topical Q0600  . diltiazem  180 mg Oral Daily  . gabapentin  300 mg Oral QHS  . gentamicin cream  1 application Topical Daily  . midodrine  10 mg Oral TID WC  . predniSONE  40 mg Oral Q breakfast  . vancomycin variable dose per unstable renal function (pharmacist dosing)   Does not apply See admin instructions     Madelon Lips, MD 10/04/2019, 10:25 AM

## 2019-10-05 ENCOUNTER — Encounter (HOSPITAL_COMMUNITY)
Admission: AD | Disposition: A | Discharge status: Home Health | Payer: Self-pay | Source: Other Acute Inpatient Hospital | Attending: Infectious Disease

## 2019-10-05 ENCOUNTER — Inpatient Hospital Stay (HOSPITAL_COMMUNITY): Payer: 59

## 2019-10-05 DIAGNOSIS — Z01810 Encounter for preprocedural cardiovascular examination: Secondary | ICD-10-CM

## 2019-10-05 LAB — COMPREHENSIVE METABOLIC PANEL
ALT: 16 U/L (ref 0–44)
AST: 27 U/L (ref 15–41)
Albumin: 1.5 g/dL — ABNORMAL LOW (ref 3.5–5.0)
Alkaline Phosphatase: 242 U/L — ABNORMAL HIGH (ref 38–126)
Anion gap: 13 (ref 5–15)
BUN: 37 mg/dL — ABNORMAL HIGH (ref 8–23)
CO2: 21 mmol/L — ABNORMAL LOW (ref 22–32)
Calcium: 6.8 mg/dL — ABNORMAL LOW (ref 8.9–10.3)
Chloride: 97 mmol/L — ABNORMAL LOW (ref 98–111)
Creatinine, Ser: 3.88 mg/dL — ABNORMAL HIGH (ref 0.61–1.24)
GFR calc Af Amer: 18 mL/min — ABNORMAL LOW (ref >60)
GFR calc non Af Amer: 15 mL/min — ABNORMAL LOW (ref >60)
Glucose, Bld: 114 mg/dL — ABNORMAL HIGH (ref 70–99)
Potassium: 4.8 mmol/L (ref 3.5–5.1)
Sodium: 131 mmol/L — ABNORMAL LOW (ref 135–145)
Total Bilirubin: 0.5 mg/dL (ref 0.3–1.2)
Total Protein: 5.5 g/dL — ABNORMAL LOW (ref 6.5–8.1)

## 2019-10-05 LAB — MAGNESIUM: Magnesium: 2.2 mg/dL (ref 1.7–2.4)

## 2019-10-05 LAB — CBC
HCT: 26 % — ABNORMAL LOW (ref 39.0–52.0)
Hemoglobin: 8 g/dL — ABNORMAL LOW (ref 13.0–17.0)
MCH: 29.4 pg (ref 26.0–34.0)
MCHC: 30.8 g/dL (ref 30.0–36.0)
MCV: 95.6 fL (ref 80.0–100.0)
Platelets: 215 10*3/uL (ref 150–400)
RBC: 2.72 MIL/uL — ABNORMAL LOW (ref 4.22–5.81)
RDW: 16.9 % — ABNORMAL HIGH (ref 11.5–15.5)
WBC: 15.5 10*3/uL — ABNORMAL HIGH (ref 4.0–10.5)
nRBC: 0 % (ref 0.0–0.2)

## 2019-10-05 LAB — HEPARIN LEVEL (UNFRACTIONATED)
Heparin Unfractionated: >2.2 IU/mL — ABNORMAL HIGH (ref 0.30–0.70)
Heparin Unfractionated: >2.2 IU/mL — ABNORMAL HIGH (ref 0.30–0.70)

## 2019-10-05 LAB — APTT
aPTT: 59 seconds — ABNORMAL HIGH (ref 24–36)
aPTT: 59 seconds — ABNORMAL HIGH (ref 24–36)

## 2019-10-05 LAB — PHOSPHORUS: Phosphorus: 3.2 mg/dL (ref 2.5–4.6)

## 2019-10-05 SURGERY — ABDOMINAL AORTOGRAM W/LOWER EXTREMITY
Anesthesia: LOCAL

## 2019-10-05 MED ORDER — HEPARIN SODIUM (PORCINE) 1000 UNIT/ML IJ SOLN
INTRAMUSCULAR | Status: AC
  Administered 2019-10-05: 4200 [IU] via INTRAVENOUS_CENTRAL
  Filled 2019-10-05: qty 5

## 2019-10-05 MED ORDER — MIDODRINE HCL 5 MG PO TABS
10.0000 mg | ORAL_TABLET | ORAL | Status: DC
  Administered 2019-10-05: 10 mg via ORAL

## 2019-10-05 MED ORDER — OXYCODONE-ACETAMINOPHEN 5-325 MG PO TABS
ORAL_TABLET | ORAL | Status: AC
  Filled 2019-10-05: qty 2

## 2019-10-05 MED ORDER — DIPHENHYDRAMINE HCL 25 MG PO CAPS
25.0000 mg | ORAL_CAPSULE | Freq: Once | ORAL | Status: AC
  Administered 2019-10-05: 25 mg via ORAL
  Filled 2019-10-05: qty 1

## 2019-10-05 MED ORDER — ACETAMINOPHEN 325 MG PO TABS
ORAL_TABLET | ORAL | Status: AC
  Administered 2019-10-05: 650 mg
  Filled 2019-10-05: qty 2

## 2019-10-05 NOTE — Progress Notes (Signed)
Lower extremity vein mapping has been completed.   Preliminary results in CV Proc.   Abram Sander 10/05/2019 2:42 PM

## 2019-10-05 NOTE — Progress Notes (Signed)
Subjective: Patient going to OR with vascular tomorrow. Tolerating diet and having bowel function. Clipped remaining visible novofil sutures at bedside and patient tolerated well.   Objective: Vital signs in last 24 hours: Temp:  [97 F (36.1 C)-98.5 F (36.9 C)] 98.5 F (36.9 C) (06/23 1204) Pulse Rate:  [37-95] 86 (06/23 1126) Resp:  [11-26] 21 (06/23 1126) BP: (78-112)/(64-88) 89/71 (06/23 1110) SpO2:  [89 %-100 %] 100 % (06/23 1126) Weight:  [92.3 kg] 92.3 kg (06/23 0422) Last BM Date: 10/03/19  Intake/Output from previous day: 06/22 0701 - 06/23 0700 In: 785.8 [P.O.:390; I.V.:121.3; IV Piggyback:274.5] Out: 175 [Urine:175] Intake/Output this shift: Total I/O In: 12.3 [I.V.:12.3] Out: -   PE: Wound granulating in well, clipped remaining visible sutures. Small amount bleeding stopped with pressure  Lab Results:  Recent Labs    10/04/19 0222 10/05/19 0630  WBC 13.3* 15.5*  HGB 7.7* 8.0*  HCT 24.1* 26.0*  PLT 216 215   BMET Recent Labs    10/04/19 0222 10/05/19 0630  NA 135 131*  K 4.1 4.8  CL 99 97*  CO2 26 21*  GLUCOSE 96 114*  BUN 27* 37*  CREATININE 3.01* 3.88*  CALCIUM 6.8* 6.8*   PT/INR No results for input(s): LABPROT, INR in the last 72 hours. CMP     Component Value Date/Time   NA 131 (L) 10/05/2019 0630   K 4.8 10/05/2019 0630   CL 97 (L) 10/05/2019 0630   CO2 21 (L) 10/05/2019 0630   GLUCOSE 114 (H) 10/05/2019 0630   BUN 37 (H) 10/05/2019 0630   CREATININE 3.88 (H) 10/05/2019 0630   CALCIUM 6.8 (L) 10/05/2019 0630   PROT 5.5 (L) 10/05/2019 0630   ALBUMIN 1.5 (L) 10/05/2019 0630   AST 27 10/05/2019 0630   ALT 16 10/05/2019 0630   ALKPHOS 242 (H) 10/05/2019 0630   BILITOT 0.5 10/05/2019 0630   GFRNONAA 15 (L) 10/05/2019 0630   GFRAA 18 (L) 10/05/2019 0630   Lipase     Component Value Date/Time   LIPASE 23 08/25/2019 1740       Studies/Results: CT ANGIO AO+BIFEM W & OR WO CONTRAST  Result Date:  10/04/2019 CLINICAL DATA:  Claudication or leg ischemia, prior revascularization. Absent femoral pulse. EXAM: CT ANGIOGRAPHY OF ABDOMINAL AORTA WITH ILIOFEMORAL RUNOFF TECHNIQUE: Multidetector CT imaging of the chest, abdomen, pelvis and lower extremities was performed using the standard protocol during bolus administration of intravenous contrast. Multiplanar CT image reconstructions and MIPs were obtained to evaluate the vascular anatomy. CONTRAST:  146mL OMNIPAQUE IOHEXOL 350 MG/ML SOLN COMPARISON:  Abdominal CT 09/03/2019. FINDINGS: Chest CTA: Vascular: Left internal jugular dialysis catheter tip in the right atrium. Aortic atherosclerosis with both calcified and noncalcified plaque. Some irregular plaque involving the distal transverse aorta. No evidence of acute aortic syndrome or penetrating ulcer. No aneurysm or aortic dissection. Conventional branching pattern from the aortic arch. 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 Moderate atherosclerosis of the additional aortic branch vessels. No central pulmonary embolus to the segmental level. Multi chamber cardiomegaly. No significant pericardial effusion. Mediastinum/nodes: Few scattered prominent mediastinal and hilar nodes that are not enlarged by size criteria. No esophageal wall thickening. No visualized thyroid nodule. Lungs: Moderate to advanced emphysema. Small to moderate bilateral pleural effusions. There is adjacent compressive atelectasis. There also tree in bud opacities in the left lower lobe, a dependent lingula and right upper lobe. There is debris within  the right mainstem bronchus. No dominant pulmonary mass. Musculoskeletal: Heterogeneous subcutaneous fluid in the low left axilla in left abdominal wall without peripherally enhancing or focal fluid collection. Degenerative change throughout the thoracic spine. There are no acute or suspicious osseous abnormalities. Abdominal  CTA: VASCULAR Aorta: No aneurysm or dissection. No evidence of vasculitis or acute abnormality. Moderate calcified noncalcified atheromatous plaque. Circumferential plaque in the infrarenal aorta causes minor luminal narrowing. Celiac: Mild plaque at the origin does not cause significant stenosis. The branch vessels are patent. SMA: Calcified noncalcified plaque in the origin and proximal vessel without significant luminal narrowing. Replaced right hepatic artery arises from the SMA. More distal branch vessels are patent. Renals: Diffusely diseased, diminutive in caliber. Multifocal areas of high-grade stenosis of both renal arteries. IMA: Stenosis at the origin but otherwise patent. RIGHT Lower Extremity Inflow: Moderate plaque involving the common iliac artery. Internal iliac artery is occluded at the origin this in distal reconstitution from collaterals. Stent in the external iliac artery which is patent. Outflow: Common femoral and profundus femoral arteries are patent. There is irregular plaque in the profundus femoral artery with areas of segmental narrowing. Stent in the proximal superficial femoral artery which is patent. The right superficial femoral artery is diffusely diseased, small in caliber with multiple areas of high-grade stenosis. Greater than 50% stenosis involving the popliteal artery. Runoff: Patent 2 vessel runoff to the ankle. LEFT Lower Extremity Inflow: Mix plaque causes greater than 50% stenosis of the left common iliac artery. The internal iliac artery is patent. The external iliac artery is occluded, also seen on prior exam. There is minimal distal reconstitution at the common femoral which is diminutive in caliber. Outflow: Minimal flow in the common femoral artery which is completely occluded over approximately 6 cm. The superficial femoral artery is completely occluded. Apparent stent in the distal superficial femoral artery is also occluded. There is some collateral fluid to the lower  extremity. Popliteal artery is reconstituted but diminutive in caliber. Runoff: Patent three vessel runoff to the ankle. Veins: No obvious venous abnormality within the limitations of this arterial phase study. The portal vein is patent. Review of the MIP images confirms the above findings. NON-VASCULAR Hepatobiliary: Multiple low-density lesions throughout the liver, grossly similar imaging last month, not well assessed on arterial phase imaging. Gallbladder physiologically distended, no calcified stone. No biliary dilatation. Pancreas: Choose Spleen: Normal in size and arterial enhancement. Adrenals/Urinary Tract: No adrenal nodule. Enlarged polycystic kidneys. Scattered renal vascular and parenchymal calcifications. No hydronephrosis. Urinary bladder is minimally distended. Stomach/Bowel: Stomach is unremarkable. There is mild hyperemia of small bowel loops and diffuse wall thickening without evidence of obstruction. Few small bowel loops opposed the anterior abdominal wall at site of abdominal wall defect. No obstruction. Normal appendix. Stool throughout the colon without colonic wall thickening or inflammation. Scattered colonic diverticula. Lymphatic: Few scattered bilateral common and external iliac lymph nodes that are not enlarged by size criteria. There is no evidence of bulky adenopathy. Reproductive: Prostate is unremarkable. Other: Peritoneal dialysis catheter in place with tip in the lower abdomen. There is ill-defined fluid insinuating between small bowel loops and small volume of abdominal ascites. No evidence of abscess or focal fluid collection. There are few scattered foci of extraluminal air within the anterior abdomen, for example series 10, image 130. This may be related to presence of peritoneal dialysis catheter, but are nonspecific. Midline abdominal wound with small amount of fluid along the subcutaneous portion of the catheter. There is bilateral flank edema.  Musculoskeletal: Chronic  partial fusion of L1-L2. Diffuse degenerative and postsurgical change in the lumbar spine. Degenerative change involving the right hip. IMPRESSION: Vascular: 1. 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 2. Chronically occluded left external iliac artery, minimal distal reconstitution. Occluded left superficial femoral artery. Distal reconstitution at the popliteal with three-vessel runoff to the left foot. 3. Diffusely diseased right superficial femoral artery with multifocal areas of high-grade stenosis. There is 2 vessel runoff to the right ankle. Nonvascular: 1. Mild hyperemia of small bowel loops and diffuse wall thickening without evidence of obstruction, may be secondary to enteritis. 2. Non organized ascites/free fluid in the abdomen. Few foci of extraluminal air in the upper abdomen are likely related to presence of peritoneal dialysis catheter. 3. Small to moderate bilateral pleural effusions with adjacent compressive atelectasis. 4. Tree in bud opacities in the left lower lobe and to a lesser extent lingula and dependent right upper lobe suggestive of bronchiolitis. 5. Moderate to advanced emphysema. 6. Additional chronic findings as described. Aortic Atherosclerosis (ICD10-I70.0) and Emphysema (ICD10-J43.9). Electronically Signed   By: Keith Rake M.D.   On: 10/04/2019 20:02   CT ANGIO CHEST AORTA W/CM & OR WO/CM  Result Date: 10/04/2019 CLINICAL DATA:  Claudication or leg ischemia, prior revascularization. Absent femoral pulse. EXAM: CT ANGIOGRAPHY OF ABDOMINAL AORTA WITH ILIOFEMORAL RUNOFF TECHNIQUE: Multidetector CT imaging of the chest, abdomen, pelvis and lower extremities was performed using the standard protocol during bolus administration of intravenous contrast. Multiplanar CT image reconstructions and MIPs were obtained to evaluate the vascular anatomy. CONTRAST:  166mL OMNIPAQUE IOHEXOL 350 MG/ML SOLN COMPARISON:   Abdominal CT 09/03/2019. FINDINGS: Chest CTA: Vascular: Left internal jugular dialysis catheter tip in the right atrium. Aortic atherosclerosis with both calcified and noncalcified plaque. Some irregular plaque involving the distal transverse aorta. No evidence of acute aortic syndrome or penetrating ulcer. No aneurysm or aortic dissection. Conventional branching pattern from the aortic arch. 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 Moderate atherosclerosis of the additional aortic branch vessels. No central pulmonary embolus to the segmental level. Multi chamber cardiomegaly. No significant pericardial effusion. Mediastinum/nodes: Few scattered prominent mediastinal and hilar nodes that are not enlarged by size criteria. No esophageal wall thickening. No visualized thyroid nodule. Lungs: Moderate to advanced emphysema. Small to moderate bilateral pleural effusions. There is adjacent compressive atelectasis. There also tree in bud opacities in the left lower lobe, a dependent lingula and right upper lobe. There is debris within the right mainstem bronchus. No dominant pulmonary mass. Musculoskeletal: Heterogeneous subcutaneous fluid in the low left axilla in left abdominal wall without peripherally enhancing or focal fluid collection. Degenerative change throughout the thoracic spine. There are no acute or suspicious osseous abnormalities. Abdominal CTA: VASCULAR Aorta: No aneurysm or dissection. No evidence of vasculitis or acute abnormality. Moderate calcified noncalcified atheromatous plaque. Circumferential plaque in the infrarenal aorta causes minor luminal narrowing. Celiac: Mild plaque at the origin does not cause significant stenosis. The branch vessels are patent. SMA: Calcified noncalcified plaque in the origin and proximal vessel without significant luminal narrowing. Replaced right hepatic artery arises from the SMA. More distal branch  vessels are patent. Renals: Diffusely diseased, diminutive in caliber. Multifocal areas of high-grade stenosis of both renal arteries. IMA: Stenosis at the origin but otherwise patent. RIGHT Lower Extremity Inflow: Moderate plaque involving the common iliac artery. Internal iliac artery is occluded at the origin  this in distal reconstitution from collaterals. Stent in the external iliac artery which is patent. Outflow: Common femoral and profundus femoral arteries are patent. There is irregular plaque in the profundus femoral artery with areas of segmental narrowing. Stent in the proximal superficial femoral artery which is patent. The right superficial femoral artery is diffusely diseased, small in caliber with multiple areas of high-grade stenosis. Greater than 50% stenosis involving the popliteal artery. Runoff: Patent 2 vessel runoff to the ankle. LEFT Lower Extremity Inflow: Mix plaque causes greater than 50% stenosis of the left common iliac artery. The internal iliac artery is patent. The external iliac artery is occluded, also seen on prior exam. There is minimal distal reconstitution at the common femoral which is diminutive in caliber. Outflow: Minimal flow in the common femoral artery which is completely occluded over approximately 6 cm. The superficial femoral artery is completely occluded. Apparent stent in the distal superficial femoral artery is also occluded. There is some collateral fluid to the lower extremity. Popliteal artery is reconstituted but diminutive in caliber. Runoff: Patent three vessel runoff to the ankle. Veins: No obvious venous abnormality within the limitations of this arterial phase study. The portal vein is patent. Review of the MIP images confirms the above findings. NON-VASCULAR Hepatobiliary: Multiple low-density lesions throughout the liver, grossly similar imaging last month, not well assessed on arterial phase imaging. Gallbladder physiologically distended, no calcified  stone. No biliary dilatation. Pancreas: Choose Spleen: Normal in size and arterial enhancement. Adrenals/Urinary Tract: No adrenal nodule. Enlarged polycystic kidneys. Scattered renal vascular and parenchymal calcifications. No hydronephrosis. Urinary bladder is minimally distended. Stomach/Bowel: Stomach is unremarkable. There is mild hyperemia of small bowel loops and diffuse wall thickening without evidence of obstruction. Few small bowel loops opposed the anterior abdominal wall at site of abdominal wall defect. No obstruction. Normal appendix. Stool throughout the colon without colonic wall thickening or inflammation. Scattered colonic diverticula. Lymphatic: Few scattered bilateral common and external iliac lymph nodes that are not enlarged by size criteria. There is no evidence of bulky adenopathy. Reproductive: Prostate is unremarkable. Other: Peritoneal dialysis catheter in place with tip in the lower abdomen. There is ill-defined fluid insinuating between small bowel loops and small volume of abdominal ascites. No evidence of abscess or focal fluid collection. There are few scattered foci of extraluminal air within the anterior abdomen, for example series 10, image 130. This may be related to presence of peritoneal dialysis catheter, but are nonspecific. Midline abdominal wound with small amount of fluid along the subcutaneous portion of the catheter. There is bilateral flank edema. Musculoskeletal: Chronic partial fusion of L1-L2. Diffuse degenerative and postsurgical change in the lumbar spine. Degenerative change involving the right hip. IMPRESSION: Vascular: 1. 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 2. Chronically occluded left external iliac artery, minimal distal reconstitution. Occluded left superficial femoral artery. Distal reconstitution at the popliteal with three-vessel runoff to the left foot. 3. Diffusely diseased right  superficial femoral artery with multifocal areas of high-grade stenosis. There is 2 vessel runoff to the right ankle. Nonvascular: 1. Mild hyperemia of small bowel loops and diffuse wall thickening without evidence of obstruction, may be secondary to enteritis. 2. Non organized ascites/free fluid in the abdomen. Few foci of extraluminal air in the upper abdomen are likely related to presence of peritoneal dialysis catheter. 3. Small to moderate bilateral pleural effusions with adjacent compressive atelectasis. 4. Tree in bud opacities in the left lower lobe  and to a lesser extent lingula and dependent right upper lobe suggestive of bronchiolitis. 5. Moderate to advanced emphysema. 6. Additional chronic findings as described. Aortic Atherosclerosis (ICD10-I70.0) and Emphysema (ICD10-J43.9). Electronically Signed   By: Keith Rake M.D.   On: 10/04/2019 20:02   VAS [place] ABI WITH/WO TBI  Result Date: 10/04/2019 LOWER EXTREMITY DOPPLER STUDY Indications: Rest pain, peripheral artery disease, and Right great toe cyanosis,              left great toe wound s/p fall x~1 week. High Risk Factors: Hypertension, hyperlipidemia, coronary artery disease.  Vascular Interventions: 08/14/2017- insertion of right CIA, right EIA, right SFA                         stents. Limitations: Today's exam was limited due to involuntary patient movement. Comparison Study: 01/03/2019- bilateral noncompressible ABI Performing Technologist: Maudry Mayhew MHA, RVT, RDCS, RDMS  Examination Guidelines: A complete evaluation includes at minimum, Doppler waveform signals and systolic blood pressure reading at the level of bilateral brachial, anterior tibial, and posterior tibial arteries, when vessel segments are accessible. Bilateral testing is considered an integral part of a complete examination. Photoelectric Plethysmograph (PPG) waveforms and toe systolic pressure readings are included as required and additional duplex testing as needed.  Limited examinations for reoccurring indications may be performed as noted.  ABI Findings: +---------+------------------+-----+----------+--------+ Right    Rt Pressure (mmHg)IndexWaveform  Comment  +---------+------------------+-----+----------+--------+ Brachial 137                    triphasic          +---------+------------------+-----+----------+--------+ PTA      98                0.72 monophasic         +---------+------------------+-----+----------+--------+ DP       23                0.17 monophasic         +---------+------------------+-----+----------+--------+ Great Toe                       Absent             +---------+------------------+-----+----------+--------+ +---------+------------------+-----+--------+----------------------------------+ Left     Lt Pressure (mmHg)IndexWaveformComment                            +---------+------------------+-----+--------+----------------------------------+ Brachial                        biphasicUnable to obtain pressure due to                                           movement                           +---------+------------------+-----+--------+----------------------------------+ PTA                             absent                                     +---------+------------------+-----+--------+----------------------------------+ DP  absent                                     +---------+------------------+-----+--------+----------------------------------+ Great Toe                       Absent                                     +---------+------------------+-----+--------+----------------------------------+ +-------+-----------+-----------+------------+------------+ ABI/TBIToday's ABIToday's TBIPrevious ABIPrevious TBI +-------+-----------+-----------+------------+------------+ Right  0.72                                            +-------+-----------+-----------+------------+------------+ Left   0.00                                           +-------+-----------+-----------+------------+------------+  Summary: Right: Resting right ankle-brachial index indicates moderate right lower extremity arterial disease. Given previous study was noncompressible, today's ABI is possibly falsely elevated. Left: Absent left pedal pulses indicate critical left limb ischemia.  *See table(s) above for measurements and observations.  Electronically signed by Servando Snare MD on 10/04/2019 at 5:16:50 PM.    Final    VAS [place] LOWER EXTREMITY ARTERIAL DUPLEX  Result Date: 10/04/2019 LOWER EXTREMITY ARTERIAL DUPLEX STUDY Indications: Rest pain, and right great toe cyanosis, left great toe wound s/p              fall ~1week. High Risk Factors: Hypertension, hyperlipidemia, coronary artery disease.  Vascular Interventions: 08/14/2017- right CIA, right EIA, right SFA stents. Current ABI:            Right= 0.7 left=0 Limitations: involuntary patient movement Comparison Study: No prior study Performing Technologist: Maudry Mayhew MHA, RDMS, RVT, RDCS  Examination Guidelines: A complete evaluation includes B-mode imaging, spectral Doppler, color Doppler, and power Doppler as needed of all accessible portions of each vessel. Bilateral testing is considered an integral part of a complete examination. Limited examinations for reoccurring indications may be performed as noted.  +----------+--------+-----+---------------+----------+--------+ RIGHT     PSV cm/sRatioStenosis       Waveform  Comments +----------+--------+-----+---------------+----------+--------+ EIA Distal83                          monophasic         +----------+--------+-----+---------------+----------+--------+ CFA Prox  46                          monophasic         +----------+--------+-----+---------------+----------+--------+ CFA Distal33                           monophasic         +----------+--------+-----+---------------+----------+--------+ DFA       53                          monophasic         +----------+--------+-----+---------------+----------+--------+ SFA Prox  593          75-99% stenosismonophasic         +----------+--------+-----+---------------+----------+--------+  SFA Mid   57                          monophasic         +----------+--------+-----+---------------+----------+--------+ SFA Distal51                          monophasic         +----------+--------+-----+---------------+----------+--------+ POP Prox  20                          monophasic         +----------+--------+-----+---------------+----------+--------+ POP Distal75                          monophasic         +----------+--------+-----+---------------+----------+--------+ TP Trunk  41                          monophasic         +----------+--------+-----+---------------+----------+--------+ ATA Distal32                          monophasic         +----------+--------+-----+---------------+----------+--------+ PTA Prox  10                          monophasic         +----------+--------+-----+---------------+----------+--------+ PTA Distal48                          monophasic         +----------+--------+-----+---------------+----------+--------+ DP        36                          monophasic         +----------+--------+-----+---------------+----------+--------+ Unable to visualize peroneal artery.  +----------+--------+-----+--------+-------------------+--------+ LEFT      PSV cm/sRatioStenosisWaveform           Comments +----------+--------+-----+--------+-------------------+--------+ CFA Distal                     Absent                      +----------+--------+-----+--------+-------------------+--------+ SFA Prox                       Absent                       +----------+--------+-----+--------+-------------------+--------+ SFA Mid                        Absent                      +----------+--------+-----+--------+-------------------+--------+ SFA Distal                     Absent                      +----------+--------+-----+--------+-------------------+--------+ POP Prox  20                   dampened monophasic         +----------+--------+-----+--------+-------------------+--------+ POP Distal28  dampened monophasic         +----------+--------+-----+--------+-------------------+--------+ ATA Distal9                    dampened monophasic         +----------+--------+-----+--------+-------------------+--------+ PTA Prox  10                   dampened monophasic         +----------+--------+-----+--------+-------------------+--------+ PTA Distal7                    dampened monophasic         +----------+--------+-----+--------+-------------------+--------+ DP        5                    dampened monophasic         +----------+--------+-----+--------+-------------------+--------+ Unable to visualize peroneal artery.  Summary: Right: 75-99% stenosis noted in the superficial femoral artery. Left: Total occlusion noted in the common femoral artery. Total occlusion noted in the deep femoral artery. Total occlusion noted in the superficial femoral artery. Minimal reconstitution of flow at the popliteal artery.  See table(s) above for measurements and observations. Electronically signed by Servando Snare MD on 10/04/2019 at 5:17:09 PM.    Final     Anti-infectives: Anti-infectives (From admission, onward)   Start     Dose/Rate Route Frequency Ordered Stop   10/04/19 2000  piperacillin-tazobactam (ZOSYN) IVPB 2.25 g     Discontinue     2.25 g 100 mL/hr over 30 Minutes Intravenous Every 8 hours 10/04/19 1537     10/03/19 2000  cefTAZidime (FORTAZ) 1 g in sodium chloride 0.9 % 100 mL IVPB   Status:  Discontinued        1 g 200 mL/hr over 30 Minutes Intravenous Every 24 hours 10/02/19 1439 10/04/19 1537   10/03/19 1200  vancomycin (VANCOCIN) IVPB 1000 mg/200 mL premix        1,000 mg 200 mL/hr over 60 Minutes Intravenous Every M-W-F (Hemodialysis) 10/03/19 0824 10/03/19 1637   10/02/19 1600  vancomycin (VANCOREADY) IVPB 2000 mg/400 mL        2,000 mg 200 mL/hr over 120 Minutes Intravenous  Once 10/02/19 1425 10/02/19 1922   10/02/19 1445  cefTAZidime (FORTAZ) 2 g in sodium chloride 0.9 % 100 mL IVPB        2 g 200 mL/hr over 30 Minutes Intravenous  Once 10/02/19 1439 10/02/19 1722   10/02/19 1428  vancomycin variable dose per unstable renal function (pharmacist dosing)  Status:  Discontinued         Does not apply See admin instructions 10/02/19 1429 10/04/19 1537       Assessment/Plan CHF COPD A fib Polycystic kidney disease, ESRD, on PD - converted to HD  H/O MI  CAD Hep C HLD  [facility] -Exploratory laparotomy, LOA, repair of SB serosal injury x3, resection of mesenteric nodule 08/29/19 Dr. Bobbye Morton -Exploratory laparotomy, washout, abdominal wall closure with placement of retention sutures for fascial dehiscence5/24/21 Dr. Georgette Dover - Continue dressing changes. Continue abdominal binder. Removed remaining visible sutures. Follow up info on AVS. - we will sign off, please call if further questions     LOS: 4 days    Aaron [name] , [facility] 10/05/2019, 12:15 PM Please see Amion for pager number during day hours 7:00am-4:30pm

## 2019-10-05 NOTE — Progress Notes (Signed)
ANTICOAGULATION CONSULT NOTE  Pharmacy Consult for heparin  Indication: atheroembolism  No Known Allergies  Patient Measurements: Weight: 93.9 kg (207 lb 0.2 oz)  Vital Signs: Temp: 98.5 F (36.9 C) (06/23 2000) Temp Source: Oral (06/23 2000) BP: 131/94 (06/23 1738) Pulse Rate: 105 (06/23 1738)  Labs: Recent Labs    10/04/19 0222 10/05/19 0630 10/05/19 1901  HGB 7.7* 8.0*  --   HCT 24.1* 26.0*  --   PLT 216 215  --   APTT  --  59* 59*  HEPARINUNFRC  --  >2.20* >2.20*  CREATININE 3.01* 3.88*  --     Estimated Creatinine Clearance: 23.9 mL/min (A) (by C-G formula based on SCr of 3.88 mg/dL (H)).   Medical History: Past Medical History:  Diagnosis Date  . Anxiety   . Arthritis    knees , back , shoulders (10/12/2017)  . Atrial fibrillation ([place])   . CAD (coronary artery disease)    Mild nonobstructive disease at cardiac catheterization 2002  . Chronic back pain    "back of my neck; down thru my legs" (10/12/2017)  . COPD (chronic obstructive pulmonary disease) ([place])   . Diverticulitis   . Esophageal reflux   . ESRD (end stage renal disease) on dialysis [facility])    "TTS; [place]" (11/23/2017)  . Essential hypertension   . Hepatitis C    states he no longer has this  . History of kidney stones   . History of syncope   . Hyperlipidemia   . Jerking 09/23/2014  . Myocardial infarction ([place]) 02/2017   "light one" (10/12/2017)  . Non-compliant behavior    non compliant with diaylsis per daughter  . PAT (paroxysmal atrial tachycardia) ([place])   . Peripheral arterial disease ([place])    Occluded left superficial femoral artery status post stent June 2016 - Dr. Trula Slade  . Pneumonia 1961  . Polycystic kidney, unspecified type   . Syncope 09/2014  . SYNCOPE 05/07/2010   Qualifier: Diagnosis of  By: Laurance Flatten, RN, BSN, Anderson [place]      Medications:  Medications Prior to Admission  Medication Sig Dispense Refill Last Dose  . albuterol (PROVENTIL HFA;VENTOLIN HFA) 108 (90 Base) MCG/ACT  inhaler Inhale 2 puffs into the lungs every 6 (six) hours as needed for wheezing or shortness of breath.   Past Week at Unknown time  . albuterol (PROVENTIL) (2.5 MG/3ML) 0.083% nebulizer solution Inhale 3 mLs into the lungs 3 (three) times daily as needed for shortness of breath or wheezing.   09/30/2019 at Unknown time  . apixaban (ELIQUIS) 5 MG TABS tablet Take 1 tablet (5 mg total) by mouth 2 (two) times daily. 60 tablet 1 09/29/2019 at 2100  . atorvastatin (LIPITOR) 80 MG tablet Take 80 mg by mouth daily.   09/29/2019 at Unknown time  . BREO ELLIPTA 200-25 MCG/INH AEPB Inhale 1 puff into the lungs daily as needed (for respiratory issues.).    Past Week at Unknown time  . calcium acetate (PHOSLO) 667 MG capsule Take 667 mg by mouth 3 (three) times daily with meals.   09/29/2019  . Darbepoetin Alfa (ARANESP) 60 MCG/0.3ML SOSY injection Inject 0.3 mLs (60 mcg total) into the vein every Wednesday with hemodialysis. 4.2 mL  09/28/2019  . diltiazem (CARDIZEM CD) 180 MG 24 hr capsule Take 1 capsule (180 mg total) by mouth daily. 90 capsule 1 09/29/2019  . docusate sodium (COLACE) 100 MG capsule Take 1 capsule (100 mg total) by mouth daily. 10 capsule 0 09/29/2019  . gabapentin (  NEURONTIN) 300 MG capsule Take 1 capsule (300 mg total) by mouth at bedtime. 90 capsule 1 09/29/2019  . metoprolol tartrate (LOPRESSOR) 25 MG tablet Take 1 tablet (25 mg total) by mouth 2 (two) times daily. 180 tablet 1 09/29/2019 at 2100  . multivitamin (RENA-VIT) TABS tablet Take 1 tablet by mouth at bedtime. 90 tablet 1 09/29/2019  . ondansetron (ZOFRAN) 4 MG tablet Take 1 tablet (4 mg total) by mouth every 8 (eight) hours as needed for nausea. 20 tablet 0 Past Week at Unknown time  . oxyCODONE-acetaminophen (PERCOCET) 10-325 MG tablet Take 1 tablet by mouth 5 (five) times daily as needed for pain.    09/29/2019  . pantoprazole (PROTONIX) 40 MG tablet Take 1 tablet (40 mg total) by mouth 2 (two) times daily. (Patient taking differently:  Take 40 mg by mouth daily. ) 60 tablet 1 09/29/2019  . senna-docusate (SENOKOT-S) 8.6-50 MG tablet Take 1 tablet by mouth 2 (two) times daily as needed for moderate constipation.   Past Week at Unknown time  . varenicline (CHANTIX) 0.5 MG tablet Take 0.5 mg by mouth 2 (two) times daily.   09/29/2019    Assessment: 64 yo male R foot ischemia. He is on apixaban for afib (last dose ~ 9am on 6/22). Pharmacy to dose heparin for atheroembolism and possible procedures -aPTT= 59 after increase to 1350 units/hr   Plan for possible L common femoral endarterectomy w/ retrograde stenting and poss bypass of LLE tomorrow.  Goal of Therapy:  Heparin level 0.3-0.7 units/ml aPTT 66-102 seconds Monitor platelets by anticoagulation protocol: Yes   Plan:  -Increase heparin infusion to 1500 units/hr -Monitor daily HL, CBC, and for s/sx of bleeding   Hildred Laser, PharmD Clinical Pharmacist **Pharmacist phone directory can now be found on [email or web address] ([email or web address]).  Listed under [place].

## 2019-10-05 NOTE — Progress Notes (Signed)
  Progress Note    10/05/2019 5:59 AM   Subjective: No overnight issues still having right great toe pain  Vitals:   10/05/19 0429 10/05/19 0500  BP:  95/84  Pulse: (!) 55 (!) 37  Resp: 16 (!) 22  Temp:    SpO2: 97% 92%    Physical Exam: Awake alert oriented Unlabored respirations Abdomen is soft dressing in the midline is clean dry intact Well-healed right groin incision, there is a 3+ comfortable pulse on the right Left groin without palpable common femoral pulse Dorsalis pedis signal on the right is strong No pedal signals on the left identifiable  CBC    Component Value Date/Time   WBC 13.3 (H) 10/04/2019 0222   RBC 2.53 (L) 10/04/2019 0222   HGB 7.7 (L) 10/04/2019 0222   HCT 24.1 (L) 10/04/2019 0222   PLT 216 10/04/2019 0222   MCV 95.3 10/04/2019 0222   MCH 30.4 10/04/2019 0222   MCHC 32.0 10/04/2019 0222   RDW 16.8 (H) 10/04/2019 0222   LYMPHSABS 0.7 09/20/2019 1306   MONOABS 1.0 09/20/2019 1306   EOSABS 0.0 09/20/2019 1306   BASOSABS 0.0 09/20/2019 1306    BMET    Component Value Date/Time   NA 135 10/04/2019 0222   K 4.1 10/04/2019 0222   CL 99 10/04/2019 0222   CO2 26 10/04/2019 0222   GLUCOSE 96 10/04/2019 0222   BUN 27 (H) 10/04/2019 0222   CREATININE 3.01 (H) 10/04/2019 0222   CALCIUM 6.8 (L) 10/04/2019 0222   GFRNONAA 21 (L) 10/04/2019 0222   GFRAA 24 (L) 10/04/2019 0222    INR    Component Value Date/Time   INR 1.6 (H) 10/01/2019 5027     Intake/Output Summary (Last 24 hours) at 10/05/2019 0559 Last data filed at 10/05/2019 0500 Gross per 24 hour  Intake 760.68 ml  Output 175 ml  Net 585.68 ml     Assessment/plan:  64 y.o. male is here with critical left lower extremity ischemia with a left great toe ulceration and then was found to have acute discoloration of the right great toe.  CT angio reviewed from yesterday demonstrates occluded left external iliac artery and common femoral artery.  He also has an occluded left subclavian  artery.  We had planned for right lower extremity angiography but access is essentially impossible given wound in the right groin that healed by secondary intention and above-noted vascular occlusions.  Given the patient also has wound on the left great toe I discussed with him proceeding to the operating room tomorrow for left common femoral endarterectomy with retrograde stenting and possible bypass of the left lower extremity and angiography of the right lower extremity with possible intervention.  We discussed the risk of wound healing which he is quite aware of from his previous issues and also the risk of amputation bilaterally.  He demonstrates good understanding.  He is okay to eat today we will make him n.p.o. past midnight tonight.    Darl Brisbin C. Donzetta Matters, MD [facility] Office: [phone] Pager: [phone]  10/05/2019 5:59 AM

## 2019-10-05 NOTE — Progress Notes (Addendum)
PROGRESS NOTE  Aaron [name] AXK:[id number] DOB: 08-05-1955 DOA: 10/01/2019 PCP: Gwenlyn Saran, [place]   LOS: 4 days   Brief narrative: As per HPI,  64 y.o. Male with PMH of ESRD on peritoneal dialysis, history of  permanent Atrial fibrillation on Eliquis who was recently admitted for small bowel obstruction  status post ex lap with retention sutures  was at dialysis on 6/18 when he became dizzy and had syncopal episode and respiratory arrest.  He was then sent to [facility] emergency department with he was noted to have elevated lactate at 6.6.  He was then transferred to [facility] when en route developed atrial fibrillation with rapid ventricular response and was given Cardizem iv which dropped his blood pressure.  He was then started on Neo-Synephrine and was admitted to the ICU.  Patient was subsequently stable and was transferred out of the ICU.   Assessment/Plan:  Principal Problem:   Peritonitis ([place]) Active Problems:   Septic shock (HCC)   Shortness of breath   Atrial fibrillation (HCC)   S/P thoracentesis   Ischemic ulcer of toe of right foot (HCC)  Syncopal Episode with hypotension Possibly secondary to peritonitis, volume depletion.  Currently off pressors. On midodrine trial.  2D echocardiogram on 05/27/2019 was 50 to 55%.  Atrial Fibrillation/RVR On presentation.  Seen by cardiology.  On Cardizem and Eliquis.  Beta-blockers on hold, could be restarted as needed.  Better control at this time.  Cardiology recommending outpatient follow-up with elective cardioversion after minimum of 3 weeks of anticoagulation if needed.  Follow-up recommended with Dr. Wylene Men [facility].  Cardiology has signed off at this time.  Borderline low blood pressure.  Closely monitor.  Lactic acidosis, recent SBO s/p ex lap bowel resection , resolving.  peritonitis, septic shock  On IV vancomycin and ceftazidime.  Nephrology and general surgery on board .  Surgery  considering interval removal of peritoneal catheter at some point.  Peritoneal fluid cultures showing Enterococcus and Proteus. Obtained ID consultation.  Dr. Megan Salon has seen the patient.  Patient has polymicrobial infection.  Continue Zosyn as per ID.  Leukocytosis trending down.  ESRD Was on peritoneal dialysis, now on HD through left internal jugular dialysis catheter M/W/F.  Continue hemodialysis as per nephrology.  Left-sided pleural effusion, dyspnea on exertion dyspnea.  Pulmonary has started on short course of prednisone.  Status post left-sided thoracocentesis on 10/03/2019 with removal of 1200 mL of straw-colored fluid.  Appears to be transudative at this time. Appears comfortable  Debility, deconditioning.  Physical therapy has been consulted.  Recommend Home health PT on discharge.  Right foot discomfort.  Bilateral lower extremity ischemic changes.  History of iliofemoral endarterectomy.  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  Possible vascular intervention likely tomorrow.  Currently on heparin drip  DVT prophylaxis: SCDs Start: 10/01/19 0607 , heparin drip  Code Status: Full code  Family Communication: None today. Spoke with the patient's daughter Ms. Whitney on the phone yesterday.  Status is: Inpatient  Remains inpatient appropriate because: Unsafe d/c plan, IV treatments appropriate due to intensity of illness or inability to take PO, Inpatient level of care appropriate due to severity of illness, ongoing hemodialysis, vascular surgery intervention,    Dispo: The patient is from: Home              Anticipated d/c is  to: Likely home with home health.  Vascular intervention likely tomorrow              Anticipated d/c date is: >3 days              Patient currently is not medically stable to  d/c.  Consultants:  PCCM,   general surgery,   Nephrology  Vascular surgery  ID   Procedures:  Hemodialysis   Thoracocentesis left on 10/03/19  Antibiotics:  . Ceftazidime, vancomycin iv 6/20>6/22 . Zosyn 6/22>  Anti-infectives (From admission, onward)   Start     Dose/Rate Route Frequency Ordered Stop   10/04/19 2000  piperacillin-tazobactam (ZOSYN) IVPB 2.25 g     Discontinue     2.25 g 100 mL/hr over 30 Minutes Intravenous Every 8 hours 10/04/19 1537     10/03/19 2000  cefTAZidime (FORTAZ) 1 g in sodium chloride 0.9 % 100 mL IVPB  Status:  Discontinued        1 g 200 mL/hr over 30 Minutes Intravenous Every 24 hours 10/02/19 1439 10/04/19 1537   10/03/19 1200  vancomycin (VANCOCIN) IVPB 1000 mg/200 mL premix        1,000 mg 200 mL/hr over 60 Minutes Intravenous Every M-W-F (Hemodialysis) 10/03/19 0824 10/03/19 1637   10/02/19 1600  vancomycin (VANCOREADY) IVPB 2000 mg/400 mL        2,000 mg 200 mL/hr over 120 Minutes Intravenous  Once 10/02/19 1425 10/02/19 1922   10/02/19 1445  cefTAZidime (FORTAZ) 2 g in sodium chloride 0.9 % 100 mL IVPB        2 g 200 mL/hr over 30 Minutes Intravenous  Once 10/02/19 1439 10/02/19 1722   10/02/19 1428  vancomycin variable dose per unstable renal function (pharmacist dosing)  Status:  Discontinued         Does not apply See admin instructions 10/02/19 1429 10/04/19 1537     Subjective: Today, patient feels okay.  Denies overt shortness of breath, cough or chest pain.  Complains of mild foot discomfort.   Objective: Vitals:   10/05/19 0600 10/05/19 0700  BP: 93/83   Pulse: 74 76  Resp: 19 (!) 21  Temp:    SpO2: 95% 100%    Intake/Output Summary (Last 24 hours) at 10/05/2019 0723 Last data filed at 10/05/2019 0700 Gross per 24 hour  Intake 785.76 ml  Output 175 ml  Net 610.76 ml   Filed Weights   10/03/19 1643 10/04/19 0500 10/05/19 0422  Weight: 90.9 kg 90.8 kg 92.3 kg   Body mass index is 24.77 kg/m.    Physical Exam: GENERAL: Patient is alert awake and oriented. Not in obvious distress.  Chronically ill, thinly built. HENT: No scleral pallor or icterus. Pupils equally reactive to light. Oral mucosa is moist NECK: is supple, no gross swelling noted. CHEST: Diminished breath sounds over the bases, right chest wall hemodialysis catheter in place. CVS: S1 and S2 heard, no murmur. Regular rate and rhythm.  ABDOMEN: Soft, non-tender, bowel sounds are present.  Incisional would dressing in place, peritoneal dialysis catheter in place. EXTREMITIES: lower extremity edema, right foot mottling with cold toes, left discoloration of the great toe.  Left side foot with ulceration of the left great toe with findings of ischemia. CNS: Cranial nerves are intact. No focal motor deficits. SKIN: warm and dry , incisional wound dressing on the abdomen, left great toe ulceration, mottling of the foot  Data Review: I have personally reviewed the following laboratory data and studies,  CBC: Recent Labs  Lab 10/02/19 0219 10/04/19 0222 10/05/19 0630  WBC 30.5* 13.3* 15.5*  HGB 7.7* 7.7* 8.0*  HCT 24.1* 24.1* 26.0*  MCV 95.3 95.3 95.6  PLT 249 216 956   Basic Metabolic Panel: Recent Labs  Lab 10/01/19 0643 10/02/19 0219 10/04/19 0222  NA 134* 136 135  K 5.4* 3.6 4.1  CL 98 100 99  CO2 22 27 26   GLUCOSE 84 103* 96  BUN 38* 23 27*  CREATININE 5.74* 3.27* 3.01*  CALCIUM 6.6* 7.1* 6.8*  MG 1.3* 1.6* 1.4*  PHOS 4.5 3.4 2.6   Liver Function Tests: Recent Labs  Lab 10/01/19 0643 10/04/19 0222  AST 52* 34  ALT 21 16  ALKPHOS 151* 272*  BILITOT 0.9 0.6  PROT 5.0* 5.1*  ALBUMIN 1.4* 1.5*   No results for input(s): LIPASE, AMYLASE in the last 168 hours. No results for input(s): AMMONIA in the last 168 hours. Cardiac Enzymes: No results for input(s): CKTOTAL, CKMB, CKMBINDEX, TROPONINI in the last 168 hours. BNP (last 3 results) Recent Labs    05/26/19 1542 08/22/19 1930 10/01/19 0643   BNP 322.8* 1,578.0* 3,857.9*    ProBNP (last 3 results) No results for input(s): PROBNP in the last 8760 hours.  CBG: Recent Labs  Lab 10/04/19 0011 10/04/19 0342 10/04/19 0638 10/04/19 1114 10/04/19 2020  GLUCAP 107* 89 95 101* 126*   Recent Results (from the past 240 hour(s))  MRSA PCR Screening     Status: None   Collection Time: 10/01/19  8:18 AM   Specimen: Nasal Mucosa; Nasopharyngeal  Result Value Ref Range Status   MRSA by PCR NEGATIVE NEGATIVE Final    Comment:        The GeneXpert MRSA Assay (FDA approved for NASAL specimens only), is one component of a comprehensive MRSA colonization surveillance program. It is not intended to diagnose MRSA infection nor to guide or monitor treatment for MRSA infections. Performed at [facility], [place] [address], [place], [place] [address]   Body fluid culture     Status: None   Collection Time: 10/01/19  1:20 PM   Specimen: Body Fluid  Result Value Ref Range Status   Specimen Description FLUID PERITONEAL DIALYSIS  Final   Special Requests Immunocompromised  Final   Gram Stain   Final    RARE WBC PRESENT, PREDOMINANTLY PMN NO ORGANISMS SEEN Performed at [facility], [address] [place], [place] [address]    Culture   Final    FEW ENTEROCOCCUS FAECALIS FEW SERRATIA MARCESCENS    Report Status 10/04/2019 FINAL  Final   Organism ID, Bacteria ENTEROCOCCUS FAECALIS  Final   Organism ID, Bacteria SERRATIA MARCESCENS  Final      Susceptibility   Enterococcus faecalis - MIC*    AMPICILLIN <=2 SENSITIVE Sensitive     VANCOMYCIN 1 SENSITIVE Sensitive     GENTAMICIN SYNERGY RESISTANT Resistant     * FEW ENTEROCOCCUS FAECALIS   Serratia marcescens - MIC*    CEFAZOLIN >=64 RESISTANT Resistant     CEFEPIME <=0.12 SENSITIVE Sensitive     CEFTAZIDIME <=1 SENSITIVE Sensitive     CEFTRIAXONE <=0.25 SENSITIVE Sensitive     CIPROFLOXACIN <=0.25 SENSITIVE Sensitive     GENTAMICIN <=1 SENSITIVE Sensitive      TRIMETH/SULFA <=20 SENSITIVE Sensitive     * FEW SERRATIA MARCESCENS  Body fluid culture (includes gram stain)     Status: None (Preliminary result)   Collection Time: 10/03/19  2:25  PM   Specimen: Pleural Fluid  Result Value Ref Range Status   Specimen Description FLUID PLEURAL LEFT  Final   Special Requests NONE  Final   Gram Stain   Final    WBC PRESENT,BOTH PMN AND MONONUCLEAR NO ORGANISMS SEEN CYTOSPIN SMEAR    Culture   Final    NO GROWTH < 24 HOURS Performed at [facility], [place] [address], [place], [place] [address]    Report Status PENDING  Incomplete  Body fluid culture     Status: None (Preliminary result)   Collection Time: 10/04/19 10:30 AM   Specimen: Peritoneal Dialysate; Body Fluid  Result Value Ref Range Status   Specimen Description PERITONEAL DIALYSATE  Final   Special Requests NONE  Final   Gram Stain   Final    ABUNDANT WBC PRESENT,BOTH PMN AND MONONUCLEAR NO ORGANISMS SEEN Performed at [facility], [address], [place], [place] [address]    Culture PENDING  Incomplete   Report Status PENDING  Incomplete     Studies: DG Chest 1 View  Result Date: 10/03/2019 CLINICAL DATA:  Status post thoracentesis EXAM: CHEST  1 VIEW COMPARISON:  10/01/2018 FINDINGS: Cardiac shadow is stable. Dialysis catheter is again seen. Small right pleural effusion is again noted. Left effusion is been reduced following thoracentesis. No pneumothorax is noted. No bony abnormality is noted. IMPRESSION: No pneumothorax following left thoracentesis. Electronically Signed   By: Inez Catalina M.D.   On: 10/03/2019 11:49   CT ANGIO AO+BIFEM W & OR WO CONTRAST  Result Date: 10/04/2019 CLINICAL DATA:  Claudication or leg ischemia, prior revascularization. Absent femoral pulse. EXAM: CT ANGIOGRAPHY OF ABDOMINAL AORTA WITH ILIOFEMORAL RUNOFF TECHNIQUE: Multidetector CT imaging of the chest, abdomen, pelvis and lower extremities was performed using the standard protocol during  bolus administration of intravenous contrast. Multiplanar CT image reconstructions and MIPs were obtained to evaluate the vascular anatomy. CONTRAST:  181mL OMNIPAQUE IOHEXOL 350 MG/ML SOLN COMPARISON:  Abdominal CT 09/03/2019. FINDINGS: Chest CTA: Vascular: Left internal jugular dialysis catheter tip in the right atrium. Aortic atherosclerosis with both calcified and noncalcified plaque. Some irregular plaque involving the distal transverse aorta. No evidence of acute aortic syndrome or penetrating ulcer. No aneurysm or aortic dissection. Conventional branching pattern from the aortic arch. 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 Moderate atherosclerosis of the additional aortic branch vessels. No central pulmonary embolus to the segmental level. Multi chamber cardiomegaly. No significant pericardial effusion. Mediastinum/nodes: Few scattered prominent mediastinal and hilar nodes that are not enlarged by size criteria. No esophageal wall thickening. No visualized thyroid nodule. Lungs: Moderate to advanced emphysema. Small to moderate bilateral pleural effusions. There is adjacent compressive atelectasis. There also tree in bud opacities in the left lower lobe, a dependent lingula and right upper lobe. There is debris within the right mainstem bronchus. No dominant pulmonary mass. Musculoskeletal: Heterogeneous subcutaneous fluid in the low left axilla in left abdominal wall without peripherally enhancing or focal fluid collection. Degenerative change throughout the thoracic spine. There are no acute or suspicious osseous abnormalities. Abdominal CTA: VASCULAR Aorta: No aneurysm or dissection. No evidence of vasculitis or acute abnormality. Moderate calcified noncalcified atheromatous plaque. Circumferential plaque in the infrarenal aorta causes minor luminal narrowing. Celiac: Mild plaque at the origin does not cause significant stenosis. The  branch vessels are patent. SMA: Calcified noncalcified plaque in the origin and proximal vessel without significant luminal narrowing. Replaced right hepatic  artery arises from the SMA. More distal branch vessels are patent. Renals: Diffusely diseased, diminutive in caliber. Multifocal areas of high-grade stenosis of both renal arteries. IMA: Stenosis at the origin but otherwise patent. RIGHT Lower Extremity Inflow: Moderate plaque involving the common iliac artery. Internal iliac artery is occluded at the origin this in distal reconstitution from collaterals. Stent in the external iliac artery which is patent. Outflow: Common femoral and profundus femoral arteries are patent. There is irregular plaque in the profundus femoral artery with areas of segmental narrowing. Stent in the proximal superficial femoral artery which is patent. The right superficial femoral artery is diffusely diseased, small in caliber with multiple areas of high-grade stenosis. Greater than 50% stenosis involving the popliteal artery. Runoff: Patent 2 vessel runoff to the ankle. LEFT Lower Extremity Inflow: Mix plaque causes greater than 50% stenosis of the left common iliac artery. The internal iliac artery is patent. The external iliac artery is occluded, also seen on prior exam. There is minimal distal reconstitution at the common femoral which is diminutive in caliber. Outflow: Minimal flow in the common femoral artery which is completely occluded over approximately 6 cm. The superficial femoral artery is completely occluded. Apparent stent in the distal superficial femoral artery is also occluded. There is some collateral fluid to the lower extremity. Popliteal artery is reconstituted but diminutive in caliber. Runoff: Patent three vessel runoff to the ankle. Veins: No obvious venous abnormality within the limitations of this arterial phase study. The portal vein is patent. Review of the MIP images confirms the above findings.  NON-VASCULAR Hepatobiliary: Multiple low-density lesions throughout the liver, grossly similar imaging last month, not well assessed on arterial phase imaging. Gallbladder physiologically distended, no calcified stone. No biliary dilatation. Pancreas: Choose Spleen: Normal in size and arterial enhancement. Adrenals/Urinary Tract: No adrenal nodule. Enlarged polycystic kidneys. Scattered renal vascular and parenchymal calcifications. No hydronephrosis. Urinary bladder is minimally distended. Stomach/Bowel: Stomach is unremarkable. There is mild hyperemia of small bowel loops and diffuse wall thickening without evidence of obstruction. Few small bowel loops opposed the anterior abdominal wall at site of abdominal wall defect. No obstruction. Normal appendix. Stool throughout the colon without colonic wall thickening or inflammation. Scattered colonic diverticula. Lymphatic: Few scattered bilateral common and external iliac lymph nodes that are not enlarged by size criteria. There is no evidence of bulky adenopathy. Reproductive: Prostate is unremarkable. Other: Peritoneal dialysis catheter in place with tip in the lower abdomen. There is ill-defined fluid insinuating between small bowel loops and small volume of abdominal ascites. No evidence of abscess or focal fluid collection. There are few scattered foci of extraluminal air within the anterior abdomen, for example series 10, image 130. This may be related to presence of peritoneal dialysis catheter, but are nonspecific. Midline abdominal wound with small amount of fluid along the subcutaneous portion of the catheter. There is bilateral flank edema. Musculoskeletal: Chronic partial fusion of L1-L2. Diffuse degenerative and postsurgical change in the lumbar spine. Degenerative change involving the right hip. IMPRESSION: Vascular: 1. 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 2. Chronically occluded  left external iliac artery, minimal distal reconstitution. Occluded left superficial femoral artery. Distal reconstitution at the popliteal with three-vessel runoff to the left foot. 3. Diffusely diseased right superficial femoral artery with multifocal areas of high-grade stenosis. There is 2 vessel runoff to the right ankle. Nonvascular: 1. Mild hyperemia of small bowel loops and diffuse wall thickening without evidence of  obstruction, may be secondary to enteritis. 2. Non organized ascites/free fluid in the abdomen. Few foci of extraluminal air in the upper abdomen are likely related to presence of peritoneal dialysis catheter. 3. Small to moderate bilateral pleural effusions with adjacent compressive atelectasis. 4. Tree in bud opacities in the left lower lobe and to a lesser extent lingula and dependent right upper lobe suggestive of bronchiolitis. 5. Moderate to advanced emphysema. 6. Additional chronic findings as described. Aortic Atherosclerosis (ICD10-I70.0) and Emphysema (ICD10-J43.9). Electronically Signed   By: Keith Rake M.D.   On: 10/04/2019 20:02   CT ANGIO CHEST AORTA W/CM & OR WO/CM  Result Date: 10/04/2019 CLINICAL DATA:  Claudication or leg ischemia, prior revascularization. Absent femoral pulse. EXAM: CT ANGIOGRAPHY OF ABDOMINAL AORTA WITH ILIOFEMORAL RUNOFF TECHNIQUE: Multidetector CT imaging of the chest, abdomen, pelvis and lower extremities was performed using the standard protocol during bolus administration of intravenous contrast. Multiplanar CT image reconstructions and MIPs were obtained to evaluate the vascular anatomy. CONTRAST:  16mL OMNIPAQUE IOHEXOL 350 MG/ML SOLN COMPARISON:  Abdominal CT 09/03/2019. FINDINGS: Chest CTA: Vascular: Left internal jugular dialysis catheter tip in the right atrium. Aortic atherosclerosis with both calcified and noncalcified plaque. Some irregular plaque involving the distal transverse aorta. No evidence of acute aortic syndrome or  penetrating ulcer. No aneurysm or aortic dissection. Conventional branching pattern from the aortic arch. 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 Moderate atherosclerosis of the additional aortic branch vessels. No central pulmonary embolus to the segmental level. Multi chamber cardiomegaly. No significant pericardial effusion. Mediastinum/nodes: Few scattered prominent mediastinal and hilar nodes that are not enlarged by size criteria. No esophageal wall thickening. No visualized thyroid nodule. Lungs: Moderate to advanced emphysema. Small to moderate bilateral pleural effusions. There is adjacent compressive atelectasis. There also tree in bud opacities in the left lower lobe, a dependent lingula and right upper lobe. There is debris within the right mainstem bronchus. No dominant pulmonary mass. Musculoskeletal: Heterogeneous subcutaneous fluid in the low left axilla in left abdominal wall without peripherally enhancing or focal fluid collection. Degenerative change throughout the thoracic spine. There are no acute or suspicious osseous abnormalities. Abdominal CTA: VASCULAR Aorta: No aneurysm or dissection. No evidence of vasculitis or acute abnormality. Moderate calcified noncalcified atheromatous plaque. Circumferential plaque in the infrarenal aorta causes minor luminal narrowing. Celiac: Mild plaque at the origin does not cause significant stenosis. The branch vessels are patent. SMA: Calcified noncalcified plaque in the origin and proximal vessel without significant luminal narrowing. Replaced right hepatic artery arises from the SMA. More distal branch vessels are patent. Renals: Diffusely diseased, diminutive in caliber. Multifocal areas of high-grade stenosis of both renal arteries. IMA: Stenosis at the origin but otherwise patent. RIGHT Lower Extremity Inflow: Moderate plaque involving the common iliac artery. Internal iliac artery is  occluded at the origin this in distal reconstitution from collaterals. Stent in the external iliac artery which is patent. Outflow: Common femoral and profundus femoral arteries are patent. There is irregular plaque in the profundus femoral artery with areas of segmental narrowing. Stent in the proximal superficial femoral artery which is patent. The right superficial femoral artery is diffusely diseased, small in caliber with multiple areas of high-grade stenosis. Greater than 50% stenosis involving the popliteal artery. Runoff: Patent 2 vessel runoff to the ankle. LEFT Lower Extremity Inflow: Mix plaque causes greater than 50% stenosis of the left common iliac artery. The internal iliac artery is patent. The external  iliac artery is occluded, also seen on prior exam. There is minimal distal reconstitution at the common femoral which is diminutive in caliber. Outflow: Minimal flow in the common femoral artery which is completely occluded over approximately 6 cm. The superficial femoral artery is completely occluded. Apparent stent in the distal superficial femoral artery is also occluded. There is some collateral fluid to the lower extremity. Popliteal artery is reconstituted but diminutive in caliber. Runoff: Patent three vessel runoff to the ankle. Veins: No obvious venous abnormality within the limitations of this arterial phase study. The portal vein is patent. Review of the MIP images confirms the above findings. NON-VASCULAR Hepatobiliary: Multiple low-density lesions throughout the liver, grossly similar imaging last month, not well assessed on arterial phase imaging. Gallbladder physiologically distended, no calcified stone. No biliary dilatation. Pancreas: Choose Spleen: Normal in size and arterial enhancement. Adrenals/Urinary Tract: No adrenal nodule. Enlarged polycystic kidneys. Scattered renal vascular and parenchymal calcifications. No hydronephrosis. Urinary bladder is minimally distended.  Stomach/Bowel: Stomach is unremarkable. There is mild hyperemia of small bowel loops and diffuse wall thickening without evidence of obstruction. Few small bowel loops opposed the anterior abdominal wall at site of abdominal wall defect. No obstruction. Normal appendix. Stool throughout the colon without colonic wall thickening or inflammation. Scattered colonic diverticula. Lymphatic: Few scattered bilateral common and external iliac lymph nodes that are not enlarged by size criteria. There is no evidence of bulky adenopathy. Reproductive: Prostate is unremarkable. Other: Peritoneal dialysis catheter in place with tip in the lower abdomen. There is ill-defined fluid insinuating between small bowel loops and small volume of abdominal ascites. No evidence of abscess or focal fluid collection. There are few scattered foci of extraluminal air within the anterior abdomen, for example series 10, image 130. This may be related to presence of peritoneal dialysis catheter, but are nonspecific. Midline abdominal wound with small amount of fluid along the subcutaneous portion of the catheter. There is bilateral flank edema. Musculoskeletal: Chronic partial fusion of L1-L2. Diffuse degenerative and postsurgical change in the lumbar spine. Degenerative change involving the right hip. IMPRESSION: Vascular: 1. 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 2. Chronically occluded left external iliac artery, minimal distal reconstitution. Occluded left superficial femoral artery. Distal reconstitution at the popliteal with three-vessel runoff to the left foot. 3. Diffusely diseased right superficial femoral artery with multifocal areas of high-grade stenosis. There is 2 vessel runoff to the right ankle. Nonvascular: 1. Mild hyperemia of small bowel loops and diffuse wall thickening without evidence of obstruction, may be secondary to enteritis. 2. Non organized  ascites/free fluid in the abdomen. Few foci of extraluminal air in the upper abdomen are likely related to presence of peritoneal dialysis catheter. 3. Small to moderate bilateral pleural effusions with adjacent compressive atelectasis. 4. Tree in bud opacities in the left lower lobe and to a lesser extent lingula and dependent right upper lobe suggestive of bronchiolitis. 5. Moderate to advanced emphysema. 6. Additional chronic findings as described. Aortic Atherosclerosis (ICD10-I70.0) and Emphysema (ICD10-J43.9). Electronically Signed   By: Keith Rake M.D.   On: 10/04/2019 20:02   VAS [place] ABI WITH/WO TBI  Result Date: 10/04/2019 LOWER EXTREMITY DOPPLER STUDY Indications: Rest pain, peripheral artery disease, and Right great toe cyanosis,              left great toe wound s/p fall x~1 week. High Risk Factors: Hypertension, hyperlipidemia, coronary artery disease.  Vascular Interventions: 08/14/2017- insertion of  right CIA, right EIA, right SFA                         stents. Limitations: Today's exam was limited due to involuntary patient movement. Comparison Study: 01/03/2019- bilateral noncompressible ABI Performing Technologist: Maudry Mayhew MHA, RVT, RDCS, RDMS  Examination Guidelines: A complete evaluation includes at minimum, Doppler waveform signals and systolic blood pressure reading at the level of bilateral brachial, anterior tibial, and posterior tibial arteries, when vessel segments are accessible. Bilateral testing is considered an integral part of a complete examination. Photoelectric Plethysmograph (PPG) waveforms and toe systolic pressure readings are included as required and additional duplex testing as needed. Limited examinations for reoccurring indications may be performed as noted.  ABI Findings: +---------+------------------+-----+----------+--------+ Right    Rt Pressure (mmHg)IndexWaveform  Comment  +---------+------------------+-----+----------+--------+ Brachial 137                     triphasic          +---------+------------------+-----+----------+--------+ PTA      98                0.72 monophasic         +---------+------------------+-----+----------+--------+ DP       23                0.17 monophasic         +---------+------------------+-----+----------+--------+ Great Toe                       Absent             +---------+------------------+-----+----------+--------+ +---------+------------------+-----+--------+----------------------------------+ Left     Lt Pressure (mmHg)IndexWaveformComment                            +---------+------------------+-----+--------+----------------------------------+ Brachial                        biphasicUnable to obtain pressure due to                                           movement                           +---------+------------------+-----+--------+----------------------------------+ PTA                             absent                                     +---------+------------------+-----+--------+----------------------------------+ DP                              absent                                     +---------+------------------+-----+--------+----------------------------------+ Great Toe                       Absent                                     +---------+------------------+-----+--------+----------------------------------+ +-------+-----------+-----------+------------+------------+  ABI/TBIToday's ABIToday's TBIPrevious ABIPrevious TBI +-------+-----------+-----------+------------+------------+ Right  0.72                                           +-------+-----------+-----------+------------+------------+ Left   0.00                                           +-------+-----------+-----------+------------+------------+  Summary: Right: Resting right ankle-brachial index indicates moderate right lower extremity arterial disease. Given  previous study was noncompressible, today's ABI is possibly falsely elevated. Left: Absent left pedal pulses indicate critical left limb ischemia.  *See table(s) above for measurements and observations.  Electronically signed by Servando Snare MD on 10/04/2019 at 5:16:50 PM.    Final    VAS [place] LOWER EXTREMITY ARTERIAL DUPLEX  Result Date: 10/04/2019 LOWER EXTREMITY ARTERIAL DUPLEX STUDY Indications: Rest pain, and right great toe cyanosis, left great toe wound s/p              fall ~1week. High Risk Factors: Hypertension, hyperlipidemia, coronary artery disease.  Vascular Interventions: 08/14/2017- right CIA, right EIA, right SFA stents. Current ABI:            Right= 0.7 left=0 Limitations: involuntary patient movement Comparison Study: No prior study Performing Technologist: Maudry Mayhew MHA, RDMS, RVT, RDCS  Examination Guidelines: A complete evaluation includes B-mode imaging, spectral Doppler, color Doppler, and power Doppler as needed of all accessible portions of each vessel. Bilateral testing is considered an integral part of a complete examination. Limited examinations for reoccurring indications may be performed as noted.  +----------+--------+-----+---------------+----------+--------+ RIGHT     PSV cm/sRatioStenosis       Waveform  Comments +----------+--------+-----+---------------+----------+--------+ EIA Distal83                          monophasic         +----------+--------+-----+---------------+----------+--------+ CFA Prox  46                          monophasic         +----------+--------+-----+---------------+----------+--------+ CFA Distal33                          monophasic         +----------+--------+-----+---------------+----------+--------+ DFA       53                          monophasic         +----------+--------+-----+---------------+----------+--------+ SFA Prox  593          75-99% stenosismonophasic          +----------+--------+-----+---------------+----------+--------+ SFA Mid   57                          monophasic         +----------+--------+-----+---------------+----------+--------+ SFA Distal51                          monophasic         +----------+--------+-----+---------------+----------+--------+ POP Prox  20  monophasic         +----------+--------+-----+---------------+----------+--------+ POP Distal75                          monophasic         +----------+--------+-----+---------------+----------+--------+ TP Trunk  41                          monophasic         +----------+--------+-----+---------------+----------+--------+ ATA Distal32                          monophasic         +----------+--------+-----+---------------+----------+--------+ PTA Prox  10                          monophasic         +----------+--------+-----+---------------+----------+--------+ PTA Distal48                          monophasic         +----------+--------+-----+---------------+----------+--------+ DP        36                          monophasic         +----------+--------+-----+---------------+----------+--------+ Unable to visualize peroneal artery.  +----------+--------+-----+--------+-------------------+--------+ LEFT      PSV cm/sRatioStenosisWaveform           Comments +----------+--------+-----+--------+-------------------+--------+ CFA Distal                     Absent                      +----------+--------+-----+--------+-------------------+--------+ SFA Prox                       Absent                      +----------+--------+-----+--------+-------------------+--------+ SFA Mid                        Absent                      +----------+--------+-----+--------+-------------------+--------+ SFA Distal                     Absent                       +----------+--------+-----+--------+-------------------+--------+ POP Prox  20                   dampened monophasic         +----------+--------+-----+--------+-------------------+--------+ POP Distal28                   dampened monophasic         +----------+--------+-----+--------+-------------------+--------+ ATA Distal9                    dampened monophasic         +----------+--------+-----+--------+-------------------+--------+ PTA Prox  10                   dampened monophasic         +----------+--------+-----+--------+-------------------+--------+ PTA Distal7  dampened monophasic         +----------+--------+-----+--------+-------------------+--------+ DP        5                    dampened monophasic         +----------+--------+-----+--------+-------------------+--------+ Unable to visualize peroneal artery.  Summary: Right: 75-99% stenosis noted in the superficial femoral artery. Left: Total occlusion noted in the common femoral artery. Total occlusion noted in the deep femoral artery. Total occlusion noted in the superficial femoral artery. Minimal reconstitution of flow at the popliteal artery.  See table(s) above for measurements and observations. Electronically signed by Servando Snare MD on 10/04/2019 at 5:17:09 PM.    Final       Flora Lipps, MD  [facility] 10/05/2019

## 2019-10-05 NOTE — Progress Notes (Addendum)
ANTICOAGULATION CONSULT NOTE  Pharmacy Consult for heparin  Indication: atheroembolism  No Known Allergies  Patient Measurements: Weight: 92.3 kg (203 lb 7.8 oz)  Vital Signs: Temp: 97.7 F (36.5 C) (06/23 0408) Temp Source: Oral (06/23 0408) BP: 93/83 (06/23 0600) Pulse Rate: 76 (06/23 0700)  Labs: Recent Labs    10/04/19 0222 10/05/19 0630  HGB 7.7* 8.0*  HCT 24.1* 26.0*  PLT 216 215  APTT  --  59*  HEPARINUNFRC  --  >2.20*  CREATININE 3.01* 3.88*    Estimated Creatinine Clearance: 23.9 mL/min (A) (by C-G formula based on SCr of 3.88 mg/dL (H)).   Medical History: Past Medical History:  Diagnosis Date  . Anxiety   . Arthritis    knees , back , shoulders (10/12/2017)  . Atrial fibrillation ([place])   . CAD (coronary artery disease)    Mild nonobstructive disease at cardiac catheterization 2002  . Chronic back pain    "back of my neck; down thru my legs" (10/12/2017)  . COPD (chronic obstructive pulmonary disease) ([place])   . Diverticulitis   . Esophageal reflux   . ESRD (end stage renal disease) on dialysis [facility])    "TTS; [place]" (11/23/2017)  . Essential hypertension   . Hepatitis C    states he no longer has this  . History of kidney stones   . History of syncope   . Hyperlipidemia   . Jerking 09/23/2014  . Myocardial infarction ([place]) 02/2017   "light one" (10/12/2017)  . Non-compliant behavior    non compliant with diaylsis per daughter  . PAT (paroxysmal atrial tachycardia) ([place])   . Peripheral arterial disease ([place])    Occluded left superficial femoral artery status post stent June 2016 - Dr. Trula Slade  . Pneumonia 1961  . Polycystic kidney, unspecified type   . Syncope 09/2014  . SYNCOPE 05/07/2010   Qualifier: Diagnosis of  By: Laurance Flatten, RN, BSN, [place]      Medications:  Medications Prior to Admission  Medication Sig Dispense Refill Last Dose  . albuterol (PROVENTIL HFA;VENTOLIN HFA) 108 (90 Base) MCG/ACT inhaler Inhale 2 puffs into the lungs every 6  (six) hours as needed for wheezing or shortness of breath.   Past Week at Unknown time  . albuterol (PROVENTIL) (2.5 MG/3ML) 0.083% nebulizer solution Inhale 3 mLs into the lungs 3 (three) times daily as needed for shortness of breath or wheezing.   09/30/2019 at Unknown time  . apixaban (ELIQUIS) 5 MG TABS tablet Take 1 tablet (5 mg total) by mouth 2 (two) times daily. 60 tablet 1 09/29/2019 at 2100  . atorvastatin (LIPITOR) 80 MG tablet Take 80 mg by mouth daily.   09/29/2019 at Unknown time  . BREO ELLIPTA 200-25 MCG/INH AEPB Inhale 1 puff into the lungs daily as needed (for respiratory issues.).    Past Week at Unknown time  . calcium acetate (PHOSLO) 667 MG capsule Take 667 mg by mouth 3 (three) times daily with meals.   09/29/2019  . Darbepoetin Alfa (ARANESP) 60 MCG/0.3ML SOSY injection Inject 0.3 mLs (60 mcg total) into the vein every Wednesday with hemodialysis. 4.2 mL  09/28/2019  . diltiazem (CARDIZEM CD) 180 MG 24 hr capsule Take 1 capsule (180 mg total) by mouth daily. 90 capsule 1 09/29/2019  . docusate sodium (COLACE) 100 MG capsule Take 1 capsule (100 mg total) by mouth daily. 10 capsule 0 09/29/2019  . gabapentin (NEURONTIN) 300 MG capsule Take 1 capsule (300 mg total) by mouth at bedtime. 90 capsule  1 09/29/2019  . metoprolol tartrate (LOPRESSOR) 25 MG tablet Take 1 tablet (25 mg total) by mouth 2 (two) times daily. 180 tablet 1 09/29/2019 at 2100  . multivitamin (RENA-VIT) TABS tablet Take 1 tablet by mouth at bedtime. 90 tablet 1 09/29/2019  . ondansetron (ZOFRAN) 4 MG tablet Take 1 tablet (4 mg total) by mouth every 8 (eight) hours as needed for nausea. 20 tablet 0 Past Week at Unknown time  . oxyCODONE-acetaminophen (PERCOCET) 10-325 MG tablet Take 1 tablet by mouth 5 (five) times daily as needed for pain.    09/29/2019  . pantoprazole (PROTONIX) 40 MG tablet Take 1 tablet (40 mg total) by mouth 2 (two) times daily. (Patient taking differently: Take 40 mg by mouth daily. ) 60 tablet 1  09/29/2019  . senna-docusate (SENOKOT-S) 8.6-50 MG tablet Take 1 tablet by mouth 2 (two) times daily as needed for moderate constipation.   Past Week at Unknown time  . varenicline (CHANTIX) 0.5 MG tablet Take 0.5 mg by mouth 2 (two) times daily.   09/29/2019    Assessment: 64 yo male R foot ischemia. He is on apixaban for afib (last dose ~ 9am on 6/22). Pharmacy to dose heparin for atheroembolism and possible procedures  Heparin level is falsely elevated at >2.2, aPTT is subtherapeutic at 59. Hgb 8, plt 215. No s/sx of bleeding or infusion issues.   Plan for possible L common femoral endarterectomy w/ retrograde stenting and poss bypass of LLE tomorrow.  Goal of Therapy:  Heparin level 0.3-0.7 units/ml aPTT 66-102 seconds Monitor platelets by anticoagulation protocol: Yes   Plan:  -Will increase heparin infusion to 1350 units/hr -Order aptt (no heparin level since still no correlation) in 8 hours -Monitor daily HL, CBC, and for s/sx of bleeding   Antonietta Jewel, PharmD, [place] Pharmac[facility]  Phone: [phone] 10/05/2019 8:21 AM  Please check AMION for all [place] phone numbers After 10:00 PM, call [place] [phone]

## 2019-10-05 NOTE — Progress Notes (Signed)
[facility] for Infectious Disease  Date of Admission:  10/01/2019      Total days of antibiotics 4             ASSESSMENT: Aaron [name] is a 64 y.o. male with polymicrobial peritonitis due to serratia and enterococcus faecalis. Repeated cell count has increased 5 fold with culture still growing serratia. Will follow for sensitivities on this sample to see if any concern for amp-c producing organism; this would effect sensitivity to 3rd generation cephalosporins. Alternative thought is that we will need to remove the PD catheter to effectively clear this infection - surgery and nephrology would prefer to wait for central abdomen to heal more if possible.  He is on piperacillin-tazobactam now to cover both organisms.    PLAN: 1. Follow micro from 6/22 for susceptibility reports 2. Continue piperacillin-tazobactam     Principal Problem:   Peritonitis ([place]) Active Problems:   Septic shock (HCC)   Shortness of breath   Atrial fibrillation (HCC)   S/P thoracentesis   Ischemic ulcer of toe of right foot ([place])   . atorvastatin  80 mg Oral Daily  . Chlorhexidine Gluconate Cloth  6 each Topical Q0600  . Chlorhexidine Gluconate Cloth  6 each Topical Q0600  . diltiazem  180 mg Oral Daily  . gabapentin  300 mg Oral QHS  . gentamicin cream  1 application Topical Daily  . midodrine  10 mg Oral TID WC  . midodrine  10 mg Oral Q M,W,F-HD  . predniSONE  40 mg Oral Q breakfast  . sodium chloride flush  3 mL Intravenous Q12H    SUBJECTIVE: Doing OK. Plans for OR tomorrow to address his leg.   Surgery team removed last sutures today and wound seems to be doing well.    Review of Systems: Review of Systems  Respiratory: Positive for cough. Negative for shortness of breath.   Gastrointestinal: Positive for abdominal pain.  Musculoskeletal: Positive for joint pain (R foot).  All other systems reviewed and are negative.   No Known Allergies  OBJECTIVE: Vitals:     10/05/19 1300 10/05/19 1314 10/05/19 1321 10/05/19 1332  BP: 126/73  (!) 125/91 126/90  Pulse: 85 75 77 79  Resp: 12 17 15 20   Temp:      TempSrc:      SpO2: 93% 95% 94%   Weight:       Body mass index is 24.77 kg/m.  Physical Exam Vitals and nursing note reviewed.  Constitutional:      Appearance: He is not ill-appearing.  HENT:     Mouth/Throat:     Mouth: Mucous membranes are moist.     Pharynx: Oropharynx is clear.  Eyes:     General: No scleral icterus. Cardiovascular:     Rate and Rhythm: Normal rate and regular rhythm.     Heart sounds: No murmur heard.   Pulmonary:     Effort: Pulmonary effort is normal.     Breath sounds: Normal breath sounds.  Abdominal:     General: There is distension.     Palpations: Abdomen is soft.     Tenderness: There is no abdominal tenderness.  Musculoskeletal:     Comments: R foot unchanged - mottled with ischemic ulcer to #1 and #5 toes  Skin:    General: Skin is warm and dry.  Neurological:     Mental Status: He is alert and oriented to person, place, and time.  Lab Results Lab Results  Component Value Date   WBC 15.5 (H) 10/05/2019   HGB 8.0 (L) 10/05/2019   HCT 26.0 (L) 10/05/2019   MCV 95.6 10/05/2019   PLT 215 10/05/2019    Lab Results  Component Value Date   CREATININE 3.88 (H) 10/05/2019   BUN 37 (H) 10/05/2019   NA 131 (L) 10/05/2019   K 4.8 10/05/2019   CL 97 (L) 10/05/2019   CO2 21 (L) 10/05/2019    Lab Results  Component Value Date   ALT 16 10/05/2019   AST 27 10/05/2019   ALKPHOS 242 (H) 10/05/2019   BILITOT 0.5 10/05/2019     Microbiology: Recent Results (from the past 240 hour(s))  MRSA PCR Screening     Status: None   Collection Time: 10/01/19  8:18 AM   Specimen: Nasal Mucosa; Nasopharyngeal  Result Value Ref Range Status   MRSA by PCR NEGATIVE NEGATIVE Final    Comment:        The GeneXpert MRSA Assay (FDA approved for NASAL specimens only), is one component of a comprehensive  MRSA colonization surveillance program. It is not intended to diagnose MRSA infection nor to guide or monitor treatment for MRSA infections. Performed at [facility], [place] [address], [place], [place] [address]   Body fluid culture     Status: None   Collection Time: 10/01/19  1:20 PM   Specimen: Body Fluid  Result Value Ref Range Status   Specimen Description FLUID PERITONEAL DIALYSIS  Final   Special Requests Immunocompromised  Final   Gram Stain   Final    RARE WBC PRESENT, PREDOMINANTLY PMN NO ORGANISMS SEEN Performed at [facility], [address] [place], [place] [address]    Culture   Final    FEW ENTEROCOCCUS FAECALIS FEW SERRATIA MARCESCENS    Report Status 10/04/2019 FINAL  Final   Organism ID, Bacteria ENTEROCOCCUS FAECALIS  Final   Organism ID, Bacteria SERRATIA MARCESCENS  Final      Susceptibility   Enterococcus faecalis - MIC*    AMPICILLIN <=2 SENSITIVE Sensitive     VANCOMYCIN 1 SENSITIVE Sensitive     GENTAMICIN SYNERGY RESISTANT Resistant     * FEW ENTEROCOCCUS FAECALIS   Serratia marcescens - MIC*    CEFAZOLIN >=64 RESISTANT Resistant     CEFEPIME <=0.12 SENSITIVE Sensitive     CEFTAZIDIME <=1 SENSITIVE Sensitive     CEFTRIAXONE <=0.25 SENSITIVE Sensitive     CIPROFLOXACIN <=0.25 SENSITIVE Sensitive     GENTAMICIN <=1 SENSITIVE Sensitive     TRIMETH/SULFA <=20 SENSITIVE Sensitive     * FEW SERRATIA MARCESCENS  Body fluid culture (includes gram stain)     Status: None (Preliminary result)   Collection Time: 10/03/19  2:25 PM   Specimen: Pleural Fluid  Result Value Ref Range Status   Specimen Description FLUID PLEURAL LEFT  Final   Special Requests NONE  Final   Gram Stain   Final    WBC PRESENT,BOTH PMN AND MONONUCLEAR NO ORGANISMS SEEN CYTOSPIN SMEAR    Culture   Final    NO GROWTH 2 DAYS Performed at [facility], [address], [place], [place] [address]    Report Status PENDING  Incomplete  Body fluid culture      Status: None (Preliminary result)   Collection Time: 10/04/19 10:30 AM   Specimen: Peritoneal Dialysate; Body Fluid  Result Value Ref Range Status   Specimen Description PERITONEAL DIALYSATE  Final  Special Requests NONE  Final   Gram Stain   Final    ABUNDANT WBC PRESENT,BOTH PMN AND MONONUCLEAR NO ORGANISMS SEEN Performed at [facility], [place] [address], [place], [place] [address]    Culture MODERATE SERRATIA MARCESCENS  Final   Report Status PENDING  Incomplete     Janene Madeira, MSN, NP-C [facility] [email or web address] Pager: [phone] Office: [phone] [place]: [phone]

## 2019-10-05 NOTE — Progress Notes (Signed)
Updated daughter on phone about patient status and plan of care. All questions answered at this time.

## 2019-10-05 NOTE — Progress Notes (Signed)
  [facility] Progress Note   Assessment/ Plan:   OP HD:MWF [facility] 4h 400/600 RIJ TDC 90kg Hep none - epo 2400 tiw - Fe 50mg / wk  Assessment/ Plan: 1. Syncope/ resp arrest - no further episodes , on nasal O2.  Off pressors now and stable hemodynamically.    2. PD cath peritonitis - TNC 5314, 94% pmn's. WBC 30k--> 13.3 with treatment.  Vanc/ ceftaz 6/19--> changed to zosyn 6/22 evening.  There was some question if PD cath was exposed in wound, appreciate gen surg input,  Not seen on MD eval.  Will treat for now, would like midline wound to heal more before PD cath removal. Repeat cell ct with increased # of cells but 1L instilled and only 200 out.  Will not attempt again as won't change management for now  Cultures from 6/19 growing enterococcus and Serratia, sensitivities reviewed.    3. PAD: for OR with VVS tomorrow  4. ESRD - on HD using TDC for now. HD on schedule MWF   5. Hyperkalemia - resolved.   6. Abd wound - slow healing wound after SBO surgery x 2 during May hospital stay  7. COPD - per primary  8. Hypotension: on midodrine 10 TID with intra-dialysis 10 mg if needed.  9. 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  Pressures still soft.   Objective:   BP (!) 85/70   Pulse 74   Temp 97.8 F (36.6 C) (Oral)   Resp 11   Wt 92.3 kg   SpO2 95%   BMI 24.77 kg/m   Physical Exam: Gen: NAD, sitting in bed CVS: RRR Resp: clear Abd: distended, soft, nontender, midline wound dressed, PD cath RLQ dressed Ext: 2+ ankle, R great toe and R 5th toe blue and cool, some mottling on sole of foot too. ACCESS: PD cath, L IJ [facility]  Labs: BMET Recent Labs  Lab 10/01/19 0643 10/02/19 0219 10/04/19 0222 10/05/19 0630  NA 134* 136 135 131*  K 5.4* 3.6 4.1 4.8  CL 98 100 99 97*  CO2 22  27 26  21*  GLUCOSE 84 103* 96 114*  BUN 38* 23 27* 37*  CREATININE 5.74* 3.27* 3.01* 3.88*  CALCIUM 6.6* 7.1* 6.8* 6.8*  PHOS 4.5 3.4 2.6 3.2   CBC Recent Labs  Lab 10/02/19 0219 10/04/19 0222 10/05/19 0630  WBC 30.5* 13.3* 15.5*  HGB 7.7* 7.7* 8.0*  HCT 24.1* 24.1* 26.0*  MCV 95.3 95.3 95.6  PLT 249 216 215      Medications:    . atorvastatin  80 mg Oral Daily  . Chlorhexidine Gluconate Cloth  6 each Topical Q0600  . Chlorhexidine Gluconate Cloth  6 each Topical Q0600  . diltiazem  180 mg Oral Daily  . gabapentin  300 mg Oral QHS  . gentamicin cream  1 application Topical Daily  . midodrine  10 mg Oral TID WC  . predniSONE  40 mg Oral Q breakfast  . sodium chloride flush  3 mL Intravenous Q12H     Madelon Lips, MD 10/05/2019, 11:21 AM

## 2019-10-06 ENCOUNTER — Inpatient Hospital Stay (HOSPITAL_COMMUNITY): Payer: 59 | Admitting: Certified Registered Nurse Anesthetist

## 2019-10-06 ENCOUNTER — Inpatient Hospital Stay (HOSPITAL_COMMUNITY): Payer: 59

## 2019-10-06 ENCOUNTER — Encounter (HOSPITAL_COMMUNITY): Payer: Self-pay | Admitting: Pulmonary Disease

## 2019-10-06 ENCOUNTER — Encounter (HOSPITAL_COMMUNITY)
Admission: AD | Disposition: A | Discharge status: Home Health | Payer: Self-pay | Source: Other Acute Inpatient Hospital | Attending: Infectious Disease

## 2019-10-06 DIAGNOSIS — I70245 Atherosclerosis of native arteries of left leg with ulceration of other part of foot: Secondary | ICD-10-CM

## 2019-10-06 HISTORY — PX: FEMORAL-POPLITEAL BYPASS GRAFT: SHX937

## 2019-10-06 HISTORY — PX: AORTOGRAM: SHX6300

## 2019-10-06 HISTORY — PX: ENDARTERECTOMY FEMORAL: SHX5804

## 2019-10-06 LAB — BODY FLUID CULTURE: Culture: NO GROWTH

## 2019-10-06 LAB — CBC
HCT: 25.3 % — ABNORMAL LOW (ref 39.0–52.0)
Hemoglobin: 7.9 g/dL — ABNORMAL LOW (ref 13.0–17.0)
MCH: 29.7 pg (ref 26.0–34.0)
MCHC: 31.2 g/dL (ref 30.0–36.0)
MCV: 95.1 fL (ref 80.0–100.0)
Platelets: 230 10*3/uL (ref 150–400)
RBC: 2.66 MIL/uL — ABNORMAL LOW (ref 4.22–5.81)
RDW: 16.9 % — ABNORMAL HIGH (ref 11.5–15.5)
WBC: 10.2 10*3/uL (ref 4.0–10.5)
nRBC: 0 % (ref 0.0–0.2)

## 2019-10-06 LAB — COMPREHENSIVE METABOLIC PANEL
ALT: 16 U/L (ref 0–44)
AST: 28 U/L (ref 15–41)
Albumin: 1.5 g/dL — ABNORMAL LOW (ref 3.5–5.0)
Alkaline Phosphatase: 216 U/L — ABNORMAL HIGH (ref 38–126)
Anion gap: 5 (ref 5–15)
BUN: 20 mg/dL (ref 8–23)
CO2: 25 mmol/L (ref 22–32)
Calcium: 6.9 mg/dL — ABNORMAL LOW (ref 8.9–10.3)
Chloride: 107 mmol/L (ref 98–111)
Creatinine, Ser: 2.57 mg/dL — ABNORMAL HIGH (ref 0.61–1.24)
GFR calc Af Amer: 30 mL/min — ABNORMAL LOW (ref >60)
GFR calc non Af Amer: 25 mL/min — ABNORMAL LOW (ref >60)
Glucose, Bld: 108 mg/dL — ABNORMAL HIGH (ref 70–99)
Potassium: 4 mmol/L (ref 3.5–5.1)
Sodium: 137 mmol/L (ref 135–145)
Total Bilirubin: 0.3 mg/dL (ref 0.3–1.2)
Total Protein: 5.4 g/dL — ABNORMAL LOW (ref 6.5–8.1)

## 2019-10-06 LAB — PHOSPHORUS: Phosphorus: 2.5 mg/dL (ref 2.5–4.6)

## 2019-10-06 LAB — MAGNESIUM: Magnesium: 1.9 mg/dL (ref 1.7–2.4)

## 2019-10-06 LAB — APTT: aPTT: 82 seconds — ABNORMAL HIGH (ref 24–36)

## 2019-10-06 LAB — GLUCOSE, CAPILLARY: Glucose-Capillary: 101 mg/dL — ABNORMAL HIGH (ref 70–99)

## 2019-10-06 LAB — PREPARE RBC (CROSSMATCH)

## 2019-10-06 LAB — SURGICAL PCR SCREEN
MRSA, PCR: NEGATIVE
Staphylococcus aureus: NEGATIVE

## 2019-10-06 LAB — HEPARIN LEVEL (UNFRACTIONATED): Heparin Unfractionated: >2.2 IU/mL — ABNORMAL HIGH (ref 0.30–0.70)

## 2019-10-06 SURGERY — ENDARTERECTOMY, FEMORAL
Anesthesia: General

## 2019-10-06 MED ORDER — MIDAZOLAM HCL 5 MG/5ML IJ SOLN
INTRAMUSCULAR | Status: DC | PRN
  Administered 2019-10-06: 1 mg via INTRAVENOUS

## 2019-10-06 MED ORDER — SODIUM CHLORIDE 0.9% IV SOLUTION: Freq: Once | INTRAVENOUS | Status: DC

## 2019-10-06 MED ORDER — HYDRALAZINE HCL 20 MG/ML IJ SOLN: 5.0000 mg | INTRAMUSCULAR | Status: DC | PRN

## 2019-10-06 MED ORDER — PHENYLEPHRINE HCL-NACL 10-0.9 MG/250ML-% IV SOLN
INTRAVENOUS | Status: DC | PRN
  Administered 2019-10-06: 15 ug/min via INTRAVENOUS

## 2019-10-06 MED ORDER — DEXAMETHASONE SODIUM PHOSPHATE 10 MG/ML IJ SOLN
INTRAMUSCULAR | Status: DC | PRN
  Administered 2019-10-06: 4 mg via INTRAVENOUS

## 2019-10-06 MED ORDER — DOCUSATE SODIUM 100 MG PO CAPS
100.0000 mg | ORAL_CAPSULE | Freq: Every day | ORAL | Status: DC
  Administered 2019-10-07 – 2019-10-13 (×5): 100 mg via ORAL
  Filled 2019-10-06 (×7): qty 1

## 2019-10-06 MED ORDER — HEMOSTATIC AGENTS (NO CHARGE) OPTIME
TOPICAL | Status: DC | PRN
  Administered 2019-10-06: 1 via TOPICAL

## 2019-10-06 MED ORDER — ETOMIDATE 2 MG/ML IV SOLN
INTRAVENOUS | Status: AC
  Filled 2019-10-06: qty 10

## 2019-10-06 MED ORDER — PROPOFOL 10 MG/ML IV BOLUS
INTRAVENOUS | Status: AC
  Filled 2019-10-06: qty 20

## 2019-10-06 MED ORDER — HEMOSTATIC AGENTS (NO CHARGE) OPTIME
TOPICAL | Status: DC | PRN
  Administered 2019-10-06 (×2): 1 via TOPICAL

## 2019-10-06 MED ORDER — ARTIFICIAL TEARS OPHTHALMIC OINT
TOPICAL_OINTMENT | OPHTHALMIC | Status: AC
  Filled 2019-10-06: qty 3.5

## 2019-10-06 MED ORDER — CHLORHEXIDINE GLUCONATE 0.12 % MT SOLN
OROMUCOSAL | Status: AC
  Administered 2019-10-06: 15 mL via OROMUCOSAL
  Filled 2019-10-06: qty 15

## 2019-10-06 MED ORDER — ROCURONIUM BROMIDE 10 MG/ML (PF) SYRINGE
PREFILLED_SYRINGE | INTRAVENOUS | Status: AC
  Filled 2019-10-06: qty 20

## 2019-10-06 MED ORDER — IODIXANOL 320 MG/ML IV SOLN: INTRAVENOUS | Status: DC | PRN

## 2019-10-06 MED ORDER — MIDAZOLAM HCL 2 MG/2ML IJ SOLN
INTRAMUSCULAR | Status: AC
  Filled 2019-10-06: qty 2

## 2019-10-06 MED ORDER — LIDOCAINE 2% (20 MG/ML) 5 ML SYRINGE
INTRAMUSCULAR | Status: AC
  Filled 2019-10-06: qty 5

## 2019-10-06 MED ORDER — LACTATED RINGERS IV SOLN: Freq: Once | INTRAVENOUS | Status: AC

## 2019-10-06 MED ORDER — SODIUM CHLORIDE 0.9 % IV SOLN
INTRAVENOUS | Status: DC | PRN
  Administered 2019-10-06: 1500 mL

## 2019-10-06 MED ORDER — ONDANSETRON HCL 4 MG/2ML IJ SOLN
INTRAMUSCULAR | Status: DC | PRN
  Administered 2019-10-06: 4 mg via INTRAVENOUS

## 2019-10-06 MED ORDER — ACETAMINOPHEN 325 MG RE SUPP
325.0000 mg | RECTAL | Status: DC | PRN
  Filled 2019-10-06: qty 2

## 2019-10-06 MED ORDER — LIDOCAINE 2% (20 MG/ML) 5 ML SYRINGE
INTRAMUSCULAR | Status: DC | PRN
  Administered 2019-10-06: 80 mg via INTRAVENOUS

## 2019-10-06 MED ORDER — POTASSIUM CHLORIDE CRYS ER 20 MEQ PO TBCR: 20.0000 meq | EXTENDED_RELEASE_TABLET | Freq: Every day | ORAL | Status: DC | PRN

## 2019-10-06 MED ORDER — FENTANYL CITRATE (PF) 100 MCG/2ML IJ SOLN
INTRAMUSCULAR | Status: AC
  Filled 2019-10-06: qty 2

## 2019-10-06 MED ORDER — ROCURONIUM BROMIDE 10 MG/ML (PF) SYRINGE
PREFILLED_SYRINGE | INTRAVENOUS | Status: DC | PRN
  Administered 2019-10-06: 60 mg via INTRAVENOUS

## 2019-10-06 MED ORDER — EPHEDRINE 5 MG/ML INJ
INTRAVENOUS | Status: AC
  Filled 2019-10-06: qty 10

## 2019-10-06 MED ORDER — ONDANSETRON HCL 4 MG/2ML IJ SOLN
INTRAMUSCULAR | Status: AC
  Filled 2019-10-06: qty 2

## 2019-10-06 MED ORDER — SODIUM CHLORIDE 0.9 % IV SOLN
INTRAVENOUS | Status: AC
  Filled 2019-10-06 (×2): qty 1.2

## 2019-10-06 MED ORDER — ACETAMINOPHEN 325 MG PO TABS: 325.0000 mg | ORAL_TABLET | ORAL | Status: DC | PRN

## 2019-10-06 MED ORDER — FENTANYL CITRATE (PF) 250 MCG/5ML IJ SOLN
INTRAMUSCULAR | Status: AC
  Filled 2019-10-06: qty 5

## 2019-10-06 MED ORDER — PROPOFOL 10 MG/ML IV BOLUS
INTRAVENOUS | Status: DC | PRN
  Administered 2019-10-06: 100 mg via INTRAVENOUS

## 2019-10-06 MED ORDER — MAGNESIUM SULFATE 2 GM/50ML IV SOLN: 2.0000 g | Freq: Every day | INTRAVENOUS | Status: DC | PRN

## 2019-10-06 MED ORDER — GUAIFENESIN-DM 100-10 MG/5ML PO SYRP
15.0000 mL | ORAL_SOLUTION | ORAL | Status: DC | PRN
  Administered 2019-10-11: 15 mL via ORAL
  Filled 2019-10-06: qty 15

## 2019-10-06 MED ORDER — LABETALOL HCL 5 MG/ML IV SOLN: 10.0000 mg | INTRAVENOUS | Status: DC | PRN

## 2019-10-06 MED ORDER — SODIUM CHLORIDE 0.9 % IV SOLN
INTRAVENOUS | Status: AC
  Filled 2019-10-06: qty 1.2

## 2019-10-06 MED ORDER — ALBUTEROL SULFATE HFA 108 (90 BASE) MCG/ACT IN AERS
INHALATION_SPRAY | RESPIRATORY_TRACT | Status: AC
  Filled 2019-10-06: qty 6.7

## 2019-10-06 MED ORDER — PROTAMINE SULFATE 10 MG/ML IV SOLN
INTRAVENOUS | Status: AC
  Filled 2019-10-06: qty 10

## 2019-10-06 MED ORDER — ONDANSETRON HCL 4 MG/2ML IJ SOLN: 4.0000 mg | Freq: Once | INTRAMUSCULAR | Status: DC | PRN

## 2019-10-06 MED ORDER — PHENOL 1.4 % MT LIQD: 1.0000 | OROMUCOSAL | Status: DC | PRN

## 2019-10-06 MED ORDER — HEPARIN SODIUM (PORCINE) 1000 UNIT/ML IJ SOLN
INTRAMUSCULAR | Status: DC | PRN
  Administered 2019-10-06 (×3): 5000 [IU] via INTRAVENOUS
  Administered 2019-10-06: 10000 [IU] via INTRAVENOUS

## 2019-10-06 MED ORDER — CHLORHEXIDINE GLUCONATE 0.12 % MT SOLN
15.0000 mL | Freq: Once | OROMUCOSAL | Status: AC
  Filled 2019-10-06: qty 15

## 2019-10-06 MED ORDER — LIDOCAINE 2% (20 MG/ML) 5 ML SYRINGE
INTRAMUSCULAR | Status: AC
  Filled 2019-10-06: qty 10

## 2019-10-06 MED ORDER — PROTAMINE SULFATE 10 MG/ML IV SOLN
INTRAVENOUS | Status: AC
  Filled 2019-10-06: qty 5

## 2019-10-06 MED ORDER — DEXAMETHASONE SODIUM PHOSPHATE 10 MG/ML IJ SOLN
INTRAMUSCULAR | Status: AC
  Filled 2019-10-06: qty 1

## 2019-10-06 MED ORDER — HEPARIN SODIUM (PORCINE) 1000 UNIT/ML IJ SOLN
INTRAMUSCULAR | Status: AC
  Filled 2019-10-06: qty 3

## 2019-10-06 MED ORDER — MIDODRINE HCL 5 MG PO TABS
10.0000 mg | ORAL_TABLET | ORAL | Status: DC | PRN
  Administered 2019-10-12: 10 mg via ORAL

## 2019-10-06 MED ORDER — SODIUM CHLORIDE 0.9 % IV SOLN: 500.0000 mL | Freq: Once | INTRAVENOUS | Status: DC | PRN

## 2019-10-06 MED ORDER — FENTANYL CITRATE (PF) 100 MCG/2ML IJ SOLN
25.0000 ug | INTRAMUSCULAR | Status: DC | PRN
  Administered 2019-10-06 (×3): 50 ug via INTRAVENOUS

## 2019-10-06 MED ORDER — PHENYLEPHRINE 40 MCG/ML (10ML) SYRINGE FOR IV PUSH (FOR BLOOD PRESSURE SUPPORT)
PREFILLED_SYRINGE | INTRAVENOUS | Status: AC
  Filled 2019-10-06: qty 10

## 2019-10-06 MED ORDER — METOPROLOL TARTRATE 5 MG/5ML IV SOLN: 2.0000 mg | INTRAVENOUS | Status: DC | PRN

## 2019-10-06 MED ORDER — 0.9 % SODIUM CHLORIDE (POUR BTL) OPTIME
TOPICAL | Status: DC | PRN
  Administered 2019-10-06: 2000 mL

## 2019-10-06 MED ORDER — SUCCINYLCHOLINE CHLORIDE 200 MG/10ML IV SOSY
PREFILLED_SYRINGE | INTRAVENOUS | Status: AC
  Filled 2019-10-06: qty 10

## 2019-10-06 MED ORDER — PROTAMINE SULFATE 10 MG/ML IV SOLN
INTRAVENOUS | Status: DC | PRN
Start: 2019-10-06 — End: 2019-10-06
  Administered 2019-10-06 (×2): 50 mg via INTRAVENOUS

## 2019-10-06 MED ORDER — HEPARIN SODIUM (PORCINE) 1000 UNIT/ML IJ SOLN
INTRAMUSCULAR | Status: AC
  Filled 2019-10-06: qty 1

## 2019-10-06 MED ORDER — ALBUMIN HUMAN 5 % IV SOLN
INTRAVENOUS | Status: DC | PRN
Start: 2019-10-06 — End: 2019-10-06

## 2019-10-06 MED ORDER — ROCURONIUM BROMIDE 10 MG/ML (PF) SYRINGE
PREFILLED_SYRINGE | INTRAVENOUS | Status: AC
  Filled 2019-10-06: qty 10

## 2019-10-06 MED ORDER — FENTANYL CITRATE (PF) 100 MCG/2ML IJ SOLN
INTRAMUSCULAR | Status: DC | PRN
  Administered 2019-10-06: 100 ug via INTRAVENOUS
  Administered 2019-10-06: 50 ug via INTRAVENOUS

## 2019-10-06 MED ORDER — CHLORHEXIDINE GLUCONATE 0.12 % MT SOLN
OROMUCOSAL | Status: AC
  Filled 2019-10-06: qty 15

## 2019-10-06 MED ORDER — IODIXANOL 320 MG/ML IV SOLN
INTRAVENOUS | Status: DC | PRN
  Administered 2019-10-06: 235 mL via INTRA_ARTERIAL

## 2019-10-06 SURGICAL SUPPLY — 116 items
ADH SKN CLS APL DERMABOND .7 (GAUZE/BANDAGES/DRESSINGS) ×6
APL PRP STRL LF DISP 70% ISPRP (MISCELLANEOUS)
BAG SNAP BAND KOVER 36X36 (MISCELLANEOUS) ×4 IMPLANT
BALLN MUSTANG 10X80X75 (BALLOONS) ×4
BALLN MUSTANG 5X100X135 (BALLOONS) ×4
BALLN MUSTANG 7X100X135 (BALLOONS) ×4
BALLOON MUSTANG 10X80X75 (BALLOONS) IMPLANT
BALLOON MUSTANG 5X100X135 (BALLOONS) IMPLANT
BALLOON MUSTANG 7X100X135 (BALLOONS) IMPLANT
BANDAGE ESMARK 6X9 LF (GAUZE/BANDAGES/DRESSINGS) IMPLANT
BLADE SURG 11 STRL SS (BLADE) ×2 IMPLANT
BNDG CMPR 9X6 STRL LF SNTH (GAUZE/BANDAGES/DRESSINGS)
BNDG ESMARK 6X9 LF (GAUZE/BANDAGES/DRESSINGS)
CANISTER SUCT 3000ML PPV (MISCELLANEOUS) ×4 IMPLANT
CANNULA VESSEL 3MM 2 BLNT TIP (CANNULA) IMPLANT
CATH ANGIO 5F BER2 65CM (CATHETERS) IMPLANT
CATH BEACON 5 .035 65 KMP TIP (CATHETERS) ×2 IMPLANT
CATH EMB 4FR 40CM (CATHETERS) ×2 IMPLANT
CATH OMNI FLUSH .035X70CM (CATHETERS) IMPLANT
CATH OMNI FLUSH 5F 65CM (CATHETERS) ×2 IMPLANT
CATH QUICKCROSS .035X135CM (MICROCATHETER) ×2 IMPLANT
CHLORAPREP W/TINT 26 (MISCELLANEOUS) ×2 IMPLANT
CLIP VESOCCLUDE MED 24/CT (CLIP) ×4 IMPLANT
CLIP VESOCCLUDE SM WIDE 24/CT (CLIP) ×4 IMPLANT
CLOSURE MYNX CONTROL 6F/7F (Vascular Products) ×2 IMPLANT
COVER BACK TABLE 80X110 HD (DRAPES) ×2 IMPLANT
COVER DOME SNAP 22 D (MISCELLANEOUS) ×4 IMPLANT
COVER PROBE W GEL 5X96 (DRAPES) ×4 IMPLANT
COVER SURGICAL LIGHT HANDLE (MISCELLANEOUS) ×2 IMPLANT
COVER WAND RF STERILE (DRAPES) ×2 IMPLANT
CUFF TOURN SGL QUICK 24 (TOURNIQUET CUFF)
CUFF TOURN SGL QUICK 34 (TOURNIQUET CUFF)
CUFF TOURN SGL QUICK 42 (TOURNIQUET CUFF) IMPLANT
CUFF TRNQT CYL 24X4X16.5-23 (TOURNIQUET CUFF) IMPLANT
CUFF TRNQT CYL 34X4.125X (TOURNIQUET CUFF) IMPLANT
DERMABOND ADVANCED (GAUZE/BANDAGES/DRESSINGS) ×6
DERMABOND ADVANCED .7 DNX12 (GAUZE/BANDAGES/DRESSINGS) ×4 IMPLANT
DEVICE TORQUE KENDALL .025-038 (MISCELLANEOUS) ×2 IMPLANT
DRAIN CHANNEL 15F RND FF W/TCR (WOUND CARE) IMPLANT
DRAPE C-ARM 42X72 X-RAY (DRAPES) IMPLANT
DRAPE FEMORAL ANGIO 80X135IN (DRAPES) ×2 IMPLANT
DRAPE HALF SHEET 40X57 (DRAPES) IMPLANT
DRAPE X-RAY CASS 24X20 (DRAPES) IMPLANT
ELECT REM PT RETURN 9FT ADLT (ELECTROSURGICAL) ×4
ELECTRODE REM PT RTRN 9FT ADLT (ELECTROSURGICAL) ×2 IMPLANT
EVACUATOR SILICONE 100CC (DRAIN) IMPLANT
FELT TEFLON 1X6 (MISCELLANEOUS) ×2 IMPLANT
GAUZE 4X4 16PLY RFD (DISPOSABLE) ×2 IMPLANT
GLIDEWIRE ADV .035X180CM (WIRE) ×2 IMPLANT
GLOVE BIO SURGEON STRL SZ7.5 (GLOVE) ×4 IMPLANT
GOWN STRL REUS W/ TWL LRG LVL3 (GOWN DISPOSABLE) ×4 IMPLANT
GOWN STRL REUS W/ TWL XL LVL3 (GOWN DISPOSABLE) ×2 IMPLANT
GOWN STRL REUS W/TWL LRG LVL3 (GOWN DISPOSABLE) ×8
GOWN STRL REUS W/TWL XL LVL3 (GOWN DISPOSABLE) ×4
GRAFT PROPATEN W/RING 6X80X60 (Vascular Products) ×2 IMPLANT
GUIDEWIRE ANGLED .035X150CM (WIRE) IMPLANT
HEMOSTAT SNOW SURGICEL 2X4 (HEMOSTASIS) ×6 IMPLANT
INSERT FOGARTY SM (MISCELLANEOUS) ×2 IMPLANT
KIT BASIN OR (CUSTOM PROCEDURE TRAY) ×4 IMPLANT
KIT ENCORE 26 ADVANTAGE (KITS) ×2 IMPLANT
KIT TURNOVER KIT B (KITS) ×4 IMPLANT
MARKER GRAFT CORONARY BYPASS (MISCELLANEOUS) IMPLANT
NDL PERC 18GX7CM (NEEDLE) ×2 IMPLANT
NEEDLE PERC 18GX7CM (NEEDLE) IMPLANT
NS IRRIG 1000ML POUR BTL (IV SOLUTION) ×8 IMPLANT
PACK PERIPHERAL VASCULAR (CUSTOM PROCEDURE TRAY) ×4 IMPLANT
PACK SURGICAL SETUP 50X90 (CUSTOM PROCEDURE TRAY) ×2 IMPLANT
PACK UNIVERSAL I (CUSTOM PROCEDURE TRAY) ×2 IMPLANT
PAD ARMBOARD 7.5X6 YLW CONV (MISCELLANEOUS) ×8 IMPLANT
PATCH HEMASHIELD 8X150 (Vascular Products) ×2 IMPLANT
PROTECTION STATION PRESSURIZED (MISCELLANEOUS) ×4
SET COLLECT BLD 21X3/4 12 (NEEDLE) IMPLANT
SET MICROPUNCTURE 5F STIFF (MISCELLANEOUS) ×4 IMPLANT
SHEATH AVANTI 11CM 5FR (SHEATH) IMPLANT
SHEATH BRITE TIP 7FRX11 (SHEATH) ×2 IMPLANT
SHEATH PINNACLE 5F 10CM (SHEATH) ×4 IMPLANT
SHEATH PINNACLE 6F 10CM (SHEATH) ×2 IMPLANT
SHEATH PINNACLE ST 6F 45CM (SHEATH) ×2 IMPLANT
SHEATH PINNACLE ST 7F 45CM (SHEATH) ×2 IMPLANT
SPONGE LAP 18X18 X RAY DECT (DISPOSABLE) ×2 IMPLANT
STATION PROTECTION PRESSURIZED (MISCELLANEOUS) ×2 IMPLANT
STENT INNOVA 8X40X130 (Permanent Stent) ×2 IMPLANT
STENT VIABAHN 5X100X120 (Permanent Stent) ×2 IMPLANT
STENT VIABAHN VBX 8X59X135 (Permanent Stent) ×2 IMPLANT
STOPCOCK 4 WAY LG BORE MALE ST (IV SETS) ×4 IMPLANT
STOPCOCK MORSE 400PSI 3WAY (MISCELLANEOUS) ×4 IMPLANT
SUT ETHILON 3 0 PS 1 (SUTURE) IMPLANT
SUT MNCRL AB 4-0 PS2 18 (SUTURE) ×8 IMPLANT
SUT PROLENE 5 0 C 1 24 (SUTURE) ×10 IMPLANT
SUT PROLENE 5 0 C 1 36 (SUTURE) ×4 IMPLANT
SUT PROLENE 6 0 BV (SUTURE) ×10 IMPLANT
SUT PROLENE 7 0 BV 1 (SUTURE) IMPLANT
SUT SILK 2 0 SH (SUTURE) ×2 IMPLANT
SUT SILK 3 0 (SUTURE)
SUT SILK 3-0 18XBRD TIE 12 (SUTURE) IMPLANT
SUT VIC AB 2-0 CT1 27 (SUTURE) ×8
SUT VIC AB 2-0 CT1 TAPERPNT 27 (SUTURE) ×4 IMPLANT
SUT VIC AB 3-0 SH 27 (SUTURE) ×8
SUT VIC AB 3-0 SH 27X BRD (SUTURE) ×4 IMPLANT
SYR 10ML LL (SYRINGE) ×12 IMPLANT
SYR 20ML LL LF (SYRINGE) ×8 IMPLANT
SYR 30ML LL (SYRINGE) ×2 IMPLANT
SYR 5ML LL (SYRINGE) ×2 IMPLANT
SYR MEDRAD MARK V 150ML (SYRINGE) ×2 IMPLANT
TOWEL GREEN STERILE (TOWEL DISPOSABLE) ×8 IMPLANT
TOWEL GREEN STERILE FF (TOWEL DISPOSABLE) ×4 IMPLANT
TRAY FOLEY MTR SLVR 16FR STAT (SET/KITS/TRAYS/PACK) ×4 IMPLANT
TUBE CONNECTING 20'X1/4 (TUBING) ×1
TUBE CONNECTING 20X1/4 (TUBING) ×1 IMPLANT
TUBING HIGH PRESSURE 120CM (CONNECTOR) ×2 IMPLANT
UNDERPAD 30X36 HEAVY ABSORB (UNDERPADS AND DIAPERS) ×4 IMPLANT
WATER STERILE IRR 1000ML POUR (IV SOLUTION) ×4 IMPLANT
WIRE BENTSON .035X145CM (WIRE) ×2 IMPLANT
WIRE G V18X300CM (WIRE) ×2 IMPLANT
WIRE ROSEN-J .035X260CM (WIRE) ×2 IMPLANT
WIRE STARTER BENTSON 035X150 (WIRE) ×2 IMPLANT

## 2019-10-06 NOTE — Progress Notes (Signed)
ANTICOAGULATION CONSULT NOTE  Pharmacy Consult for heparin  Indication: atheroembolism  No Known Allergies  Patient Measurements: Weight: 90.2 kg (198 lb 13.7 oz)  Vital Signs: Temp: 98.3 F (36.8 C) (06/24 0340) Temp Source: Oral (06/24 0340) BP: 135/90 (06/24 0906) Pulse Rate: 59 (06/24 0900)  Labs: Recent Labs    10/04/19 0222 10/04/19 0222 10/05/19 0630 10/05/19 1901 10/06/19 0222  HGB 7.7*   < > 8.0*  --  7.9*  HCT 24.1*  --  26.0*  --  25.3*  PLT 216  --  215  --  230  APTT  --   --  59* 59* 82*  HEPARINUNFRC  --   --  >2.20* >2.20* >2.20*  CREATININE 3.01*  --  3.88*  --  2.57*   < > = values in this interval not displayed.    Estimated Creatinine Clearance: 36.1 mL/min (A) (by C-G formula based on SCr of 2.57 mg/dL (H)).   Medical History: Past Medical History:  Diagnosis Date  . Anxiety   . Arthritis    knees , back , shoulders (10/12/2017)  . Atrial fibrillation ([place])   . CAD (coronary artery disease)    Mild nonobstructive disease at cardiac catheterization 2002  . Chronic back pain    "back of my neck; down thru my legs" (10/12/2017)  . COPD (chronic obstructive pulmonary disease) ([place])   . Diverticulitis   . Esophageal reflux   . ESRD (end stage renal disease) on dialysis [facility])    "TTS; [facility]" (11/23/2017)  . Essential hypertension   . Hepatitis C    states he no longer has this  . History of kidney stones   . History of syncope   . Hyperlipidemia   . Jerking 09/23/2014  . Myocardial infarction ([place]) 02/2017   "light one" (10/12/2017)  . Non-compliant behavior    non compliant with diaylsis per daughter  . PAT (paroxysmal atrial tachycardia) ([place])   . Peripheral arterial disease ([place])    Occluded left superficial femoral artery status post stent June 2016 - Dr. Trula Slade  . Pneumonia 1961  . Polycystic kidney, unspecified type   . Syncope 09/2014  . SYNCOPE 05/07/2010   Qualifier: Diagnosis of  By: Laurance Flatten, RN, BSN, Anderson [place]      Medications:   Medications Prior to Admission  Medication Sig Dispense Refill Last Dose  . albuterol (PROVENTIL HFA;VENTOLIN HFA) 108 (90 Base) MCG/ACT inhaler Inhale 2 puffs into the lungs every 6 (six) hours as needed for wheezing or shortness of breath.   Past Week at Unknown time  . albuterol (PROVENTIL) (2.5 MG/3ML) 0.083% nebulizer solution Inhale 3 mLs into the lungs 3 (three) times daily as needed for shortness of breath or wheezing.   09/30/2019 at Unknown time  . apixaban (ELIQUIS) 5 MG TABS tablet Take 1 tablet (5 mg total) by mouth 2 (two) times daily. 60 tablet 1 09/29/2019 at 2100  . atorvastatin (LIPITOR) 80 MG tablet Take 80 mg by mouth daily.   09/29/2019 at Unknown time  . BREO ELLIPTA 200-25 MCG/INH AEPB Inhale 1 puff into the lungs daily as needed (for respiratory issues.).    Past Week at Unknown time  . calcium acetate (PHOSLO) 667 MG capsule Take 667 mg by mouth 3 (three) times daily with meals.   09/29/2019  . Darbepoetin Alfa (ARANESP) 60 MCG/0.3ML SOSY injection Inject 0.3 mLs (60 mcg total) into the vein every Wednesday with hemodialysis. 4.2 mL  09/28/2019  . diltiazem (CARDIZEM CD) 180 MG  24 hr capsule Take 1 capsule (180 mg total) by mouth daily. 90 capsule 1 09/29/2019  . docusate sodium (COLACE) 100 MG capsule Take 1 capsule (100 mg total) by mouth daily. 10 capsule 0 09/29/2019  . gabapentin (NEURONTIN) 300 MG capsule Take 1 capsule (300 mg total) by mouth at bedtime. 90 capsule 1 09/29/2019  . metoprolol tartrate (LOPRESSOR) 25 MG tablet Take 1 tablet (25 mg total) by mouth 2 (two) times daily. 180 tablet 1 09/29/2019 at 2100  . multivitamin (RENA-VIT) TABS tablet Take 1 tablet by mouth at bedtime. 90 tablet 1 09/29/2019  . ondansetron (ZOFRAN) 4 MG tablet Take 1 tablet (4 mg total) by mouth every 8 (eight) hours as needed for nausea. 20 tablet 0 Past Week at Unknown time  . oxyCODONE-acetaminophen (PERCOCET) 10-325 MG tablet Take 1 tablet by mouth 5 (five) times daily as needed for pain.     09/29/2019  . pantoprazole (PROTONIX) 40 MG tablet Take 1 tablet (40 mg total) by mouth 2 (two) times daily. (Patient taking differently: Take 40 mg by mouth daily. ) 60 tablet 1 09/29/2019  . senna-docusate (SENOKOT-S) 8.6-50 MG tablet Take 1 tablet by mouth 2 (two) times daily as needed for moderate constipation.   Past Week at Unknown time  . varenicline (CHANTIX) 0.5 MG tablet Take 0.5 mg by mouth 2 (two) times daily.   09/29/2019    Assessment: 64 yo male R foot ischemia. He is on apixaban for afib (last dose ~ 9am on 6/22). Pharmacy to dose heparin for atheroembolism and possible procedures -aPTT= 59 after increase to 1350 units/hr  Heparin level remains falsely elevated >2.2, aPTT is therapeutic at 82, on 1500 units/hr. Hgb 7.9, plt 230. No s/sx of bleeding or infusion issues. Plan for vascular procedure today.   Goal of Therapy:  Heparin level 0.3-0.7 units/ml aPTT 66-102 seconds Monitor platelets by anticoagulation protocol: Yes   Plan:  -Continue heparin infusion at 1500 units/hr -Monitor daily HL, CBC, and for s/sx of bleeding  -F/u after vascular procedure  Antonietta Jewel, PharmD, [place] Pharmacist  Phone: [phone] 10/06/2019 10:02 AM  Please check AMION for all [place] phone numbers After 10:00 PM, call [place] [phone]

## 2019-10-06 NOTE — Op Note (Addendum)
Patient name: Aaron [name] MRN: [id number] DOB: 1956/01/12 Sex: male  10/06/2019 Pre-operative Diagnosis: Critical left lower extremity ischemia with left great toe ulceration, right lower extremity embolic disease Post-operative diagnosis:  Same Surgeon:  Eda Paschal. Donzetta Matters, MD Assistant: Risa Grill, PA Procedure Performed: 1.  Extensive left common femoral endarterectomy extending into the external iliac artery, proximal SFA and profunda femoris artery with Dacron patch angioplasty 2.  Ultrasound-guided cannulation right common femoral artery 3.  Stent of left common iliac artery with 8 x 40 Innova and stent of left external iliac artery with 8 x 59 mm VBX dilated distally with 10 mm balloon 4.  Left lower extremity angiography 5.  Aortogram with right lower extremity angiography 6.  Stent of right SFA with 5 mm x 10 cm Viabahn 7.  Left common femoral to below-knee popliteal artery bypass with 6 mm ringed PTFE 8.  Attempted minx device closure right common femoral artery   Indications: 64 year old male who has a history of a right common femoral endarterectomy that healed by secondary intention.  He also has right common and external iliac artery stents has a very strong palpable pulse on that side.  He has no stenting of his right and left SFA.  Most recently he developed acute toe pain on the right was thought to be secondary to an embolizing lesion as he had a very strong signal at the dorsalis pedis.  On the left side he has a toe ulceration he had no common femoral pulse and was found to have an occluded external iliac artery down to the common femoral artery by CT angio.  He also has an occluded left subclavian artery.  Therefore in order to treat all of his issues we elected to proceed with left common femoral endarterectomy and stenting of his left common and external iliac arteries with right lower extremity angiography and possible intervention and left lower extremity angiography  and probable bypass.  An assistant was needed during this case due to the complexity and multiple areas of endovascular and open revascularization.  Findings: Left common femoral artery was completely occluded with heavily calcification and soft thrombus within this that was all chronic.  We performed an extensive endarterectomy with Dacron patch angioplasty.  This extended from the SFA is high as we could reach under the inguinal ligament on the external iliac artery.  The only bleeding we really had was from side branches and backbleeding from the profunda.  We then performed angiography from a retrograde fashion could not get through the external iliac artery lesion.  We were able to get through antegrade from the right common femoral approach.  We then primarily stented the common iliac artery with noncovered stent across the patent hypogastric artery and covered stent in the external iliac artery given the heavy calcification.  We then had a strong common femoral pulse.  Runoff via angiography demonstrated occluded SFA and SFA stents reconstituting popliteal artery at the knee on the left with two-vessel runoff via the anterior tibial artery and peroneal artery.  After bypass we had very good anterior tibial artery signal at the ankle that augmented with compression of the graft.  The right side, and external leg artery stents were patent there was no hypogastric flow.  Common femoral endarterectomy was patent.  The SFA initially was patent there was a stent that did appear to be thrombus laden and distally there was a 90% stenosis this also appeared to have thrombus just proximal to it  which could be the embolizing lesion.  This was primarily stented with a Viabahn covered stent and then postdilated with 5 mm balloon.  At completion the patient did have a very strong dorsalis pedis signal.  We attempted to deploy a minx device on the right but this failed and pressure was held.   Procedure:  The patient  was identified in the holding area and taken to the operating room where is placed supine on the operating table and general anesthesia was induced.  He was sterilely prepped and draped in the bilateral groins the left lower extremity antibiotics were administered timeout was called.  We began with longitudinal incision in the left groin over the palpable common femoral artery that had no pulse.  We dissected down this was heavily calcified.  We dissected out the SFA and the profunda.  We then dissected under the inguinal ligament divided the crossing vein.  As far as we could possibly dissecting the external iliac artery we could not identify any pulsatility.  Ultimately we elected to heparinize the patient and open the artery longitudinally.  We did this starting at the SFA where we performed endarterectomy and into the profunda and we did establish good backbleeding from the profunda but not the SFA.  We then extended our endarterectomy up to the level of the inguinal ligament.  We then performed endarterectomy inside the vessel without arteriotomy as high as we could we still did not establish good inflow.  We did have some backbleeding from the profunda.  We then elected to sew a dacryon patch in place with 5-0 Prolene suture.  After this we cannulated the patch with a micropuncture needle followed the wire and sheath and placed a 5 French sheath.  I attempted to use a Glidewire advantage to retrograde cross the external iliac artery occlusion.  I did perform retrograde angiography which demonstrated the occlusion.  Unfortunately entered a dissection plane in retrograde fashion.  We then used ultrasound guidance and cannulated the right common femoral artery.  This was done with micropuncture needle followed by wire sheath.  Glidewire advantage was placed followed by 5 [place] sheath.  We then placed the Omni catheter into the level of the aorta performed aortogram which demonstrated some aortic disease patency  of the stents on the right side occluded left external iliac artery with patency of the hypogastric on the left.  We then used a Glidewire advantage and ultimately a bare catheter were able to cross antegrade confirmed intraluminal access in the left common femoral artery.  We then exchanged for a long 7 [place] sheath.  We dilated the occluded lesion with 7 mm balloon.  We then primarily stented the external iliac artery with a 8 x 59 mm VBX and then proximally with an 8 x 40 Innova at the level of the hypogastric.  These were postdilated with a millimeter balloon.  We then used a 10 mm balloon distally to post dilate the VBX into the previously endarterectomized artery.  We then turned our attention towards the right lower extremity.  From the left side where I had a 5 French sheath I then crossed the bifurcation with Omni catheter and Glidewire advantage.  I performed right lower extremity angiography with very catheter and identified the apparent embolic lesion.  I then exchanged for a long 6 French sheath up and over the bifurcation over a Rosen wire.  I used a V 18 quick cross catheter to cross the lesion.  I then primarily deployed  a Viabahn in the area of the embolizing lesion.  This was postdilated with 5 mm balloon.  Completion demonstrated brisk runoff without residual stenosis were previously was nearly occluded and there was an embolizing lesion.  I then removed my wire and catheter and then performed left lower extremity angiography from the left sided sheath.  This demonstrated occluded SFA and stents reconstitution of the popliteal with runoff via the anterior tibial and peroneal arteries.  We elected to bypass.  The sheath was removed we suture-ligated the area and the dacryon patch with 5-0 Prolene suture.  We then made an incision below the knee.  We dissected out identified the vein this was diminutive not suitable for bypass.  We dissected down identified the popliteal vein mobilized this  medially.  We placed a vessel loop around the popliteal artery.  Patient again was given further amount of heparin.  We tunneled a 6 mm ringed PTFE graft between the 2 incisions.  In the groin I transected the SFA and tied it off distally.  It had been endarterectomized with very strong inflow.  I then beveled my graft and sewed end-to-end with 6-0 Prolene suture.  Upon completion we flushed through the graft.  We then clamped the distal popliteal artery followed by proximally opened longitudinally.  There was good backbleeding and antegrade bleeding we remove the clamps.  We irrigated with heparinized saline.  We straighten the leg from the graft to size and sewed in place with 6-0 Prolene suture.  Prior to completion without flushing all directions.  Upon completion was very good signal at the anterior tibial artery that augmented with compression of the graft.  We also had a good signal in the anterior tibial artery on the left.  50 mg protamine was administered unfortunately patient was still coagulopathic we administered separate 50 mg of protamine and then a teg evaluation was sent by anesthesia.  We did have enough hemostasis to begin to close.  The groin and below-knee incision were closed with Vicryl Monocryl after they were irrigated.  Dermabond was placed at the level of the skin.  We exchanged on the right side for a short 7 French sheath and attempted implant minx device this did not deploy given the heavy scar tissue and pressure was held till hemostasis obtained.  He was then awake from anesthesia having tolerated procedure without immediate complication.  All counts were correct at completion.  EBL: 500 cc  Transfusion: 2 units packed red blood cells   [place] Lysag[place] C. Donzetta Matters, MD [facility] Office: [phone] Pager: [phone]

## 2019-10-06 NOTE — Anesthesia Procedure Notes (Signed)
Arterial Line Insertion Start/End6/24/2021 3:05 PM, 10/06/2019 3:21 PM Performed by: Neldon Newport, CRNA, CRNA  Preanesthetic checklist: patient identified, site marked, risks and benefits discussed, surgical consent, monitors and equipment checked, pre-op evaluation and timeout performed Right, radial was placed Catheter size: 20 G Hand hygiene performed  and maximum sterile barriers used   Attempts: 1 Procedure performed without using ultrasound guided technique. Following insertion, dressing applied and Biopatch. Post procedure assessment: normal and unchanged  Patient tolerated the procedure well with no immediate complications.

## 2019-10-06 NOTE — Anesthesia Preprocedure Evaluation (Addendum)
Anesthesia Evaluation  Patient identified by MRN, date of birth, ID band Patient awake    Reviewed: Allergy & Precautions, NPO status , Patient's Chart, lab work & pertinent test results, reviewed documented beta blocker date and time   Airway Mallampati: II  TM Distance: >3 FB Neck ROM: Full    Dental  (+) Edentulous Upper, Edentulous Lower   Pulmonary shortness of breath, with exertion, at rest and lying, pneumonia, COPD,  COPD inhaler, Current Smoker and Patient abstained from smoking.,  Hx/o left pleural effusion S/P thoracentesis   Pulmonary exam normal breath sounds clear to auscultation       Cardiovascular hypertension, Pt. on medications + CAD, + Past MI, + Peripheral Vascular Disease and +CHF  Normal cardiovascular exam+ dysrhythmias Atrial Fibrillation  Rhythm:Regular Rate:Normal  Echo 05/27/19: 1. Left ventricular ejection fraction, by estimation, is 50 to 55%. The  left ventricle has low normal function. The left ventricle has no regional  wall motion abnormalities. There is mildly increased left ventricular  hypertrophy. Left ventricular  diastolic parameters are indeterminate.  2. Right ventricular systolic function is normal. The right ventricular  size is normal.  3. Left atrial size was moderately dilated.  4. Right atrial size was mildly dilated.  5. The mitral valve is normal in structure and function. Trivial mitral  valve regurgitation. No evidence of mitral stenosis.  6. The aortic valve is tricuspid. Aortic valve regurgitation is not  visualized. Mild aortic valve sclerosis is present, with no evidence of  aortic valve stenosis.  7. The inferior vena cava is normal in size with greater than 50%  respiratory variability, suggesting right atrial pressure of 3 mmHg.  Left common femoral artery blockage   Neuro/Psych PSYCHIATRIC DISORDERS Anxiety Depression negative neurological ROS      GI/Hepatic GERD  Medicated and Controlled,(+) Hepatitis -, CSMALL BOWEL OBSTRUCTION  Exploratory laparotomy, LOA, repair of SB serosal injury x3, resection of mesenteric nodule 08/29/19  Peritonitis being treated with ABX Hx/o septic shock    Endo/Other  negative endocrine ROSHyperlipidemia  Renal/GU ESRF and DialysisRenal disease (TTHS)Hx/o Peritoneal dialysis Hx/o renal calculi  negative genitourinary   Musculoskeletal  (+) Arthritis , Gangrene left great toe Chronic back pain   Abdominal Distended   Peds  Hematology  (+) Blood dyscrasia (Eliquis), anemia ,   Anesthesia Other Findings   Reproductive/Obstetrics                            Anesthesia Physical  Anesthesia Plan  ASA: IV  Anesthesia Plan: General   Post-op Pain Management:    Induction: Intravenous  PONV Risk Score and Plan: 3 and Dexamethasone, Ondansetron and Midazolam  Airway Management Planned: Oral ETT  Additional Equipment:   Intra-op Plan:   Post-operative Plan: Possible Post-op intubation/ventilation  Informed Consent: I have reviewed the patients History and Physical, chart, labs and discussed the procedure including the risks, benefits and alternatives for the proposed anesthesia with the patient or authorized representative who has indicated his/her understanding and acceptance.   Patient has DNR.   Dental advisory given  Plan Discussed with: CRNA  Anesthesia Plan Comments:         Anesthesia Quick Evaluation

## 2019-10-06 NOTE — Transfer of Care (Signed)
Immediate Anesthesia Transfer of Care Note  Patient: Aaron [name]  Procedure(s) Performed: LEFT COMMON FEMORAL ENDARTERECTOMY WITH PATCH ANGIOPLASTY USING A  (0.3 BY 6IN.) HEMASHIELD FINESSE PATCH (Left ) LEFT COMMON FEMORAL TO BELOW KNEE POPLITEAL ARTERY BYPASS GRAFT USING A 6MM BY 80CM PROPATEN GRAFT (Left ) AORTAGRAM WITH PLACEMENT OF VIABAHN 5MM  BY 10CM RIGHT SUPERFICIAL FEMORAL ARTERY STENT, LEFT EXTERNAL ILIAC ARTERY STENT USING A 8 X 42 VBX STENT, AND LEFT COMMON INTERNAL ILIAC  ARTERY STENT USING A 8MM BY 40MM INNOVA STENT (N/A )  Patient Location: PACU  Anesthesia Type:General  Level of Consciousness: awake  Airway & Oxygen Therapy: Patient Spontanous Breathing and Patient connected to face mask oxygen  Post-op Assessment: Report given to RN and Post -op Vital signs reviewed and stable  Post vital signs: Reviewed and stable  Last Vitals:  Vitals Value Taken Time  BP 114/84 10/06/19 2059  Temp    Pulse 96 10/06/19 2108  Resp 14 10/06/19 2108  SpO2 100 % 10/06/19 2108  Vitals shown include unvalidated device data.  Last Pain:  Vitals:   10/06/19 1230  TempSrc:   PainSc: Asleep      Patients Stated Pain Goal: 0 (72/18/28 8337)  Complications: No complications documented.

## 2019-10-06 NOTE — Progress Notes (Signed)
[facility]ious Disease  Date of Admission:  10/01/2019      Total days of antibiotics 5  Day 3 piperacillin-tazobactam            ASSESSMENT: Aaron [name] is a 64 y.o. male with polymicrobial peritonitis due to serratia and enterococcus faecalis. Repeated peritoneal fluid growing Serratia again - this is still showing to be sensitive to 3rd generation cephalosporins. I worry we will not be able to clear this infection without removal of PD catheter.   Please send repeat peritoneal sample 6/25 for cell count and culture - this would be helpful to determine further duration of antibiotic treatment.     PLAN: 1. Continue piperacillin-tazobactam  2. Please send repeat PD specimen tomorrow for cell count and culture    Principal Problem:   Peritonitis ([place]) Active Problems:   Septic shock ([facility])   Shortness of breath   Atrial fibrillation ([facility])   S/P thoracentesis   Ischemic ulcer of toe of right foot ([place])   . atorvastatin  80 mg Oral Daily  . Chlorhexidine Gluconate Cloth  6 each Topical Q0600  . Chlorhexidine Gluconate Cloth  6 each Topical Q0600  . diltiazem  180 mg Oral Daily  . gabapentin  300 mg Oral QHS  . gentamicin cream  1 application Topical Daily  . midodrine  10 mg Oral TID WC  . predniSONE  40 mg Oral Q breakfast  . sodium chloride flush  3 mL Intravenous Q12H    SUBJECTIVE: Surgery got pushed back for a vascular emergency.  Belly pain is better in his opinion. Tolerating PO diet.    Review of Systems: Review of Systems  Respiratory: Positive for cough. Negative for shortness of breath.   Gastrointestinal: Positive for abdominal pain.  Musculoskeletal: Positive for joint pain (R foot).  All other systems reviewed and are negative.   No Known Allergies  OBJECTIVE: Vitals:   10/06/19 0906 10/06/19 1000 10/06/19 1100 10/06/19 1200  BP: 135/90 (!) 144/91 (!) 134/104 (!) 123/96  Pulse:  88 93 79  Resp:  17 14 20     Temp:   97.8 F (36.6 C)   TempSrc:   Oral   SpO2:  100% 98% 96%  Weight:       Body mass index is 24.21 kg/m.  Physical Exam Vitals and nursing note reviewed.  Constitutional:      Appearance: He is not ill-appearing.  HENT:     Mouth/Throat:     Mouth: Mucous membranes are moist.     Pharynx: Oropharynx is clear.  Eyes:     General: No scleral icterus. Cardiovascular:     Rate and Rhythm: Normal rate and regular rhythm.     Heart sounds: No murmur heard.   Pulmonary:     Effort: Pulmonary effort is normal.     Breath sounds: Normal breath sounds.  Abdominal:     General: There is distension.     Palpations: Abdomen is soft.     Tenderness: There is no abdominal tenderness.  Musculoskeletal:     Comments: R foot unchanged - mottled with ischemic ulcer to #1 and #5 toes  Skin:    General: Skin is warm and dry.  Neurological:     Mental Status: He is alert and oriented to person, place, and time.     Lab Results Lab Results  Component Value Date   WBC 10.2 10/06/2019   HGB 7.9 (L)  10/06/2019   HCT 25.3 (L) 10/06/2019   MCV 95.1 10/06/2019   PLT 230 10/06/2019    Lab Results  Component Value Date   CREATININE 2.57 (H) 10/06/2019   BUN 20 10/06/2019   NA 137 10/06/2019   K 4.0 10/06/2019   CL 107 10/06/2019   CO2 25 10/06/2019    Lab Results  Component Value Date   ALT 16 10/06/2019   AST 28 10/06/2019   ALKPHOS 216 (H) 10/06/2019   BILITOT 0.3 10/06/2019     Microbiology: Recent Results (from the past 240 hour(s))  MRSA PCR Screening     Status: None   Collection Time: 10/01/19  8:18 AM   Specimen: Nasal Mucosa; Nasopharyngeal  Result Value Ref Range Status   MRSA by PCR NEGATIVE NEGATIVE Final    Comment:        The GeneXpert MRSA Assay (FDA approved for NASAL specimens only), is one component of a comprehensive MRSA colonization surveillance program. It is not intended to diagnose MRSA infection nor to guide or monitor treatment  for MRSA infections. Performed at [facility], [place] [address], [place], [place] [address]   Body fluid culture     Status: None   Collection Time: 10/01/19  1:20 PM   Specimen: Body Fluid  Result Value Ref Range Status   Specimen Description FLUID PERITONEAL DIALYSIS  Final   Special Requests Immunocompromised  Final   Gram Stain   Final    RARE WBC PRESENT, PREDOMINANTLY PMN NO ORGANISMS SEEN Performed at [facility], [address], [place], [place] [address]    Culture   Final    FEW ENTEROCOCCUS FAECALIS FEW SERRATIA MARCESCENS    Report Status 10/04/2019 FINAL  Final   Organism ID, Bacteria ENTEROCOCCUS FAECALIS  Final   Organism ID, Bacteria SERRATIA MARCESCENS  Final      Susceptibility   Enterococcus faecalis - MIC*    AMPICILLIN <=2 SENSITIVE Sensitive     VANCOMYCIN 1 SENSITIVE Sensitive     GENTAMICIN SYNERGY RESISTANT Resistant     * FEW ENTEROCOCCUS FAECALIS   Serratia marcescens - MIC*    CEFAZOLIN >=64 RESISTANT Resistant     CEFEPIME <=0.12 SENSITIVE Sensitive     CEFTAZIDIME <=1 SENSITIVE Sensitive     CEFTRIAXONE <=0.25 SENSITIVE Sensitive     CIPROFLOXACIN <=0.25 SENSITIVE Sensitive     GENTAMICIN <=1 SENSITIVE Sensitive     TRIMETH/SULFA <=20 SENSITIVE Sensitive     * FEW SERRATIA MARCESCENS  Body fluid culture (includes gram stain)     Status: None   Collection Time: 10/03/19  2:25 PM   Specimen: Pleural Fluid  Result Value Ref Range Status   Specimen Description FLUID PLEURAL LEFT  Final   Special Requests NONE  Final   Gram Stain   Final    WBC PRESENT,BOTH PMN AND MONONUCLEAR NO ORGANISMS SEEN CYTOSPIN SMEAR    Culture   Final    NO GROWTH 3 DAYS Performed at [facility], [address], [place], [place] [address]    Report Status 10/06/2019 FINAL  Final  Body fluid culture     Status: None (Preliminary result)   Collection Time: 10/04/19 10:30 AM   Specimen: Peritoneal Dialysate; Body Fluid  Result Value Ref Range  Status   Specimen Description PERITONEAL DIALYSATE  Final   Special Requests NONE  Final   Gram Stain   Final    ABUNDANT WBC PRESENT,BOTH PMN AND MONONUCLEAR NO  ORGANISMS SEEN    Culture MODERATE SERRATIA MARCESCENS  Final   Report Status PENDING  Incomplete   Organism ID, Bacteria SERRATIA MARCESCENS  Final      Susceptibility   Serratia marcescens - MIC*    CEFAZOLIN >=64 RESISTANT Resistant     CEFEPIME 0.5 SENSITIVE Sensitive     CEFTAZIDIME <=1 SENSITIVE Sensitive     CEFTRIAXONE 0.5 SENSITIVE Sensitive     CIPROFLOXACIN <=0.25 SENSITIVE Sensitive     GENTAMICIN <=1 SENSITIVE Sensitive     TRIMETH/SULFA Value in next row Sensitive      <=20 SENSITIVEPerformed at [place] [address] [place], [place] [address]    * MODERATE SERRATIA MARCESCENS  Surgical pcr screen     Status: None   Collection Time: 10/05/19  9:53 PM   Specimen: Nasal Mucosa; Nasal Swab  Result Value Ref Range Status   MRSA, PCR NEGATIVE NEGATIVE Final   Staphylococcus aureus NEGATIVE NEGATIVE Final    Comment: (NOTE) The Xpert SA Assay (FDA approved for NASAL specimens in patients 17 years of age and older), is one component of a comprehensive surveillance program. It is not intended to diagnose infection nor to guide or monitor treatment. Performed at [facility], [place] [address] [place], [place] [address]      Janene Madeira, MSN, NP-C [facility] Infectious Disease [email or web address] Pager: [phone] Office: [place]: [phone]

## 2019-10-06 NOTE — Progress Notes (Signed)
Physical Therapy Treatment Patient Details Name: Aaron [name] MRN: [id number] DOB: [birth year] Today's Date: 10/06/2019    History of Present Illness Patient is a 64 y/o male admitted with syncope during HD with respiratory arrest. PMHx: pt with ESRD normally on peritoneal dialysist but has been on HD since SBO s/p ex lap 5/17 with repeat 5/24, discitis, HLD, CAD, PAD, A-fib with RVR, HTN, COPD, CHF.    PT Comments    Pt reports being frustrated that procedure postponed for an emergency but understands need. Pt willing to mobilize today and limited by HR up to 153 with gait. Pt educated for gait and RW use and encouraged to mobilize with nursing and continue HEP. Will follow post op for further recommendations.   HR 111-153 with return to 117 at rest SpO2 88-95% on RA with cues for breathing technique    Follow Up Recommendations  Home health PT;Supervision for mobility/OOB     Equipment Recommendations  None recommended by PT    Recommendations for Other Services       Precautions / Restrictions Precautions Precautions: Fall Precaution Comments: abdominal incision, right foot discoloration await aortogram, watch HR    Mobility  Bed Mobility Overal bed mobility: Modified Independent             General bed mobility comments: HOb 15 degrees with increased time  Transfers Overall transfer level: Needs assistance   Transfers: Sit to/from Stand Sit to Stand: Min guard         General transfer comment: cues for hand placement with assist to manage lines. decreased control of descent with pt not fully backing to surface  Ambulation/Gait Ambulation/Gait assistance: Min guard Gait Distance (Feet): 130 Feet Assistive device: Rolling walker (2 wheeled) Gait Pattern/deviations: Step-through pattern;Decreased stride length;Trunk flexed   Gait velocity interpretation: 1.31 - 2.62 ft/sec, indicative of limited community ambulator General Gait Details: cues for posture  and proximity to RW. Limited gait due to HR up to 153 and able to decrease to 132 with standing rest   Stairs             Wheelchair Mobility    Modified Rankin (Stroke Patients Only)       Balance Overall balance assessment: Mild deficits observed, not formally tested                                          Cognition Arousal/Alertness: Awake/alert Behavior During Therapy: WFL for tasks assessed/performed Overall Cognitive Status: Within Functional Limits for tasks assessed                                        Exercises General Exercises - Lower Extremity Long Arc Quad: AROM;Both;Seated;15 reps Hip Flexion/Marching: AROM;Both;Seated;15 reps    General Comments        Pertinent Vitals/Pain Pain Score: 6  Pain Location: right foot Pain Descriptors / Indicators: Aching Pain Intervention(s): Limited activity within patient's tolerance;Repositioned    Home Living                      Prior Function            PT Goals (current goals can now be found in the care plan section) Progress towards PT goals: Progressing toward goals    Frequency  Min 3X/week      PT Plan Current plan remains appropriate    Co-evaluation              AM-PAC PT "6 Clicks" Mobility   Outcome Measure  Help needed turning from your back to your side while in a flat bed without using bedrails?: None Help needed moving from lying on your back to sitting on the side of a flat bed without using bedrails?: None Help needed moving to and from a bed to a chair (including a wheelchair)?: A Little Help needed standing up from a chair using your arms (e.g., wheelchair or bedside chair)?: A Little Help needed to walk in hospital room?: A Little Help needed climbing 3-5 steps with a railing? : A Little 6 Click Score: 20    End of Session   Activity Tolerance: Patient tolerated treatment well Patient left: in chair;with call  bell/phone within reach Nurse Communication: Mobility status PT Visit Diagnosis: Other abnormalities of gait and mobility (R26.89);Muscle weakness (generalized) (M62.81);Difficulty in walking, not elsewhere classified (R26.2);Pain Pain - Right/Left: Right Pain - part of body: Ankle and joints of foot     Time: 1000-1019 PT Time Calculation (min) (ACUTE ONLY): 19 min  Charges:  $Gait Training: 8-22 mins                     Aaron [name], PT Acute Rehabilitation Services Pager: [phone] Office: [phone]    Aaron [name] 10/06/2019, 12:40 PM

## 2019-10-06 NOTE — OR Nursing (Signed)
Pressure held right groin for a 5 minutes by Dr. Donzetta Matters and an additional 15 minutes by Johnette Abraham RN; soft and without hematoma.

## 2019-10-06 NOTE — Progress Notes (Signed)
  [facility] Progress Note   Assessment/ Plan:   OP HD:MWF [facility] 4h 400/600 RIJ TDC 90kg Hep none - epo 2400 tiw - Fe 50mg / wk  Assessment/ Plan: 1. Syncope/ resp arrest - no further episodes , on nasal O2.  Off pressors now and stable hemodynamically.    2. PD cath peritonitis - TNC 5314, 94% pmn's. WBC 30k--> 13.3 with treatment.  Vanc/ ceftaz 6/19--> changed to zosyn 6/22 evening.  There was some question if PD cath was exposed in wound, appreciate gen surg input,  Not seen on MD eval.  Will treat for now, would like midline wound to heal more before PD cath removal. Repeat cell ct with increased # of cells but 1L instilled and only 200 out.  Will not attempt again as won't change management for now.  Cultures from 6/19 growing enterococcus and Serratia, sensitivities reviewed.    3. PAD: for OR with [facility] today.  4. ESRD - on HD using TDC for now. HD on schedule MWF.  Will write orders for tomorrow.  5. Hyperkalemia - resolved.   6. Abd wound - slow healing wound after SBO surgery x 2 during May hospital stay  7. COPD - per primary  8. Hypotension: on midodrine 10 TID with intra-dialysis 10 mg if needed.  9. 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  HD yesterday, 2.5L off.     Objective:   BP 124/89   Pulse 95   Temp 98.3 F (36.8 C) (Oral)   Resp (!) 21   Wt 90.2 kg   SpO2 99%   BMI 24.21 kg/m   Physical Exam: Gen: NAD, sitting in bed CVS: RRR Resp: clear Abd: distended, soft, nontender, midline wound dressed, PD cath RLQ dressed Ext: 2+ ankle edema, R great toe and R 5th toe blue and cool, some mottling on sole of foot too. ACCESS: PD cath, L IJ [facility]  Labs: BMET Recent Labs  Lab 10/01/19 0643 10/02/19 0219 10/04/19 0222 10/05/19 0630 10/06/19 0222  NA 134* 136 135 131* 137  K 5.4* 3.6  4.1 4.8 4.0  CL 98 100 99 97* 107  CO2 22 27 26  21* 25  GLUCOSE 84 103* 96 114* 108*  BUN 38* 23 27* 37* 20  CREATININE 5.74* 3.27* 3.01* 3.88* 2.57*  CALCIUM 6.6* 7.1* 6.8* 6.8* 6.9*  PHOS 4.5 3.4 2.6 3.2 2.5   CBC Recent Labs  Lab 10/02/19 0219 10/04/19 0222 10/05/19 0630 10/06/19 0222  WBC 30.5* 13.3* 15.5* 10.2  HGB 7.7* 7.7* 8.0* 7.9*  HCT 24.1* 24.1* 26.0* 25.3*  MCV 95.3 95.3 95.6 95.1  PLT 249 216 215 230      Medications:    . atorvastatin  80 mg Oral Daily  . Chlorhexidine Gluconate Cloth  6 each Topical Q0600  . Chlorhexidine Gluconate Cloth  6 each Topical Q0600  . diltiazem  180 mg Oral Daily  . gabapentin  300 mg Oral QHS  . gentamicin cream  1 application Topical Daily  . midodrine  10 mg Oral TID WC  . midodrine  10 mg Oral Q M,W,F-HD  . predniSONE  40 mg Oral Q breakfast  . sodium chloride flush  3 mL Intravenous Q12H     Madelon Lips, MD 10/06/2019, 8:31 AM

## 2019-10-06 NOTE — Anesthesia Postprocedure Evaluation (Signed)
Anesthesia Post Note  Patient: [name]  Procedure(s) Performed: LEFT COMMON FEMORAL ENDARTERECTOMY WITH PATCH ANGIOPLASTY USING A  (0.3 BY 6IN.) HEMASHIELD FINESSE PATCH (Left ) LEFT COMMON FEMORAL TO BELOW KNEE POPLITEAL ARTERY BYPASS GRAFT USING A 6MM BY 80CM PROPATEN GRAFT (Left ) AORTAGRAM WITH PLACEMENT OF VIABAHN 5MM  BY 10CM RIGHT SUPERFICIAL FEMORAL ARTERY STENT, LEFT EXTERNAL ILIAC ARTERY STENT USING A 8 X 58 VBX STENT, AND LEFT COMMON INTERNAL ILIAC  ARTERY STENT USING A 8MM BY 40MM INNOVA STENT (N/A )     Patient location during evaluation: PACU Anesthesia Type: General Level of consciousness: sedated Pain management: pain level controlled Vital Signs Assessment: post-procedure vital signs reviewed and stable Respiratory status: spontaneous breathing and respiratory function stable Cardiovascular status: stable Postop Assessment: no apparent nausea or vomiting Anesthetic complications: no   No complications documented.  Last Vitals:  Vitals:   10/06/19 2145 10/06/19 2200  BP: 116/76 110/75  Pulse: 91 88  Resp: 20 (!) 22  Temp: (!) 36.3 C   SpO2: 94% 98%    Last Pain:  Vitals:   10/06/19 2200  TempSrc:   PainSc: 5                  Eleana Tocco DANIEL

## 2019-10-06 NOTE — Progress Notes (Signed)
  Progress Note    10/06/2019 11:03 AM * No surgery date entered *  Subjective: No overnight issues still having right great toe pain  Vitals:   10/06/19 0906 10/06/19 1000  BP: 135/90 (!) 144/91  Pulse:  88  Resp:  17  Temp:    SpO2:  100%    Physical Exam: Awake alert oriented Easily palpable right palpable pulse No palpable left comfortable.  Stable gangrenous changes left great toe and purple discoloration of right great toe  CBC    Component Value Date/Time   WBC 10.2 10/06/2019 0222   RBC 2.66 (L) 10/06/2019 0222   HGB 7.9 (L) 10/06/2019 0222   HCT 25.3 (L) 10/06/2019 0222   PLT 230 10/06/2019 0222   MCV 95.1 10/06/2019 0222   MCH 29.7 10/06/2019 0222   MCHC 31.2 10/06/2019 0222   RDW 16.9 (H) 10/06/2019 0222   LYMPHSABS 0.7 09/20/2019 1306   MONOABS 1.0 09/20/2019 1306   EOSABS 0.0 09/20/2019 1306   BASOSABS 0.0 09/20/2019 1306    BMET    Component Value Date/Time   NA 137 10/06/2019 0222   K 4.0 10/06/2019 0222   CL 107 10/06/2019 0222   CO2 25 10/06/2019 0222   GLUCOSE 108 (H) 10/06/2019 0222   BUN 20 10/06/2019 0222   CREATININE 2.57 (H) 10/06/2019 0222   CALCIUM 6.9 (L) 10/06/2019 0222   GFRNONAA 25 (L) 10/06/2019 0222   GFRAA 30 (L) 10/06/2019 0222    INR    Component Value Date/Time   INR 1.6 (H) 10/01/2019 2426     Intake/Output Summary (Last 24 hours) at 10/06/2019 1103 Last data filed at 10/06/2019 1000 Gross per 24 hour  Intake 428.9 ml  Output 2500 ml  Net -2071.1 ml     Assessment/plan:  64 y.o. male is here with a difficult vascular situation.  He has new onset right great toe pain which appears to be embolic in nature with what appears to be occlusive SFA disease and a strong dorsalis pedis pulse and a strong right common femoral arterial pulse.  He has a right groin wound that is healed by secondary intention after endarterectomy.  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  Level of disease.  We are planning for left common femoral endarterectomy with retrograde external iliac artery stent, lower extremity angiography possible intervention of the right lower extremity endovascularly and possible left common femoral to below-knee popliteal artery bypass if necessary.  I discussed with him proceeding risk and benefits and he demonstrates good understanding.   Unfortunately there there is an emergency case which is delaying his case at this time but he is willing to wait.    Aaron [name] C. Donzetta Matters, MD [facility] Office: [phone] Pager: [phone]  10/06/2019 11:03 AM

## 2019-10-06 NOTE — Progress Notes (Signed)
PROGRESS NOTE  Aaron [name] [facility]MC:[id number] DOB: Jun 29, 1955 DOA: 10/01/2019 PCP: [facility] [place]   LOS: 5 days   Brief narrative: As per HPI,  64 y.o. Male with PMH of ESRD on peritoneal dialysis, history of  permanent Atrial fibrillation on Eliquis who was recently admitted for small bowel obstruction  status post exploratory laparatomy with retention sutures  was at dialysis on 6/18 when he became dizzy and had syncopal episode and respiratory arrest.  He was then sent to [facility] emergency department with he was noted to have elevated lactate at 6.6.  He was then transferred to [facility] when en route developed atrial fibrillation with rapid ventricular response and was given Cardizem iv which dropped his blood pressure.  He was then started on Neo-Synephrine and was admitted to the ICU.  Patient was subsequently stable and was transferred out of the ICU.   Assessment/Plan:  Principal Problem:   Peritonitis ([place]) Active Problems:   Septic shock (HCC)   Shortness of breath   Atrial fibrillation (HCC)   S/P thoracentesis   Ischemic ulcer of toe of right foot (HCC)  Syncopal Episode with hypotension Possibly secondary to peritonitis, volume depletion.  Currently off pressors. On midodrine trial.  2D echocardiogram on 05/27/2019 was 50 to 55%.  Atrial Fibrillation/RVR On presentation.  Seen by cardiology.  On Cardizem and Eliquis.  Beta-blockers on hold, could be restarted as needed.   Cardiology recommending outpatient follow-up with elective cardioversion after minimum of 3 weeks of anticoagulation if needed.  Follow-up recommended with Dr. Denman George, [facility].  Cardiology has signed off at this time.  Blood pressure has improved at this time.   Lactic acidosis, recent SBO s/p ex lap bowel resection , resolving.  peritonitis, septic shock  Peritoneal fluid cultures showing Enterococcus and Proteus.   Continue Zosyn as per ID.  Leukocytosis  trending down.  ESRD Was on peritoneal dialysis, now on HD through left internal jugular dialysis catheter M/W/F.  Continue hemodialysis as per nephrology.  Left-sided pleural effusion, dyspnea on exertion dyspnea.  Pulmonary has started on short course of prednisone.  Status post left-sided thoracocentesis on 10/03/2019 with removal of 1200 mL of straw-colored fluid.  Appears to be transudative at this time. Appears comfortable  Debility, deconditioning.  Physical therapy has been consulted.  Recommend Home health PT on discharge.  Right foot discomfort.  Bilateral lower extremity ischemic changes.  History of iliofemoral endarterectomy.  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  Possible vascular intervention today. Currently on heparin drip  DVT prophylaxis: SCDs Start: 10/01/19 0607 , heparin drip  Code Status: Full code  Family Communication: None today.   Status is: Inpatient  Remains inpatient appropriate because: Unsafe d/c plan, IV treatments appropriate due to intensity of illness or inability to take PO, Inpatient level of care appropriate due to severity of illness, ongoing hemodialysis, vascular surgery intervention,    Dispo: The patient is from: Home              Anticipated d/c is to: Likely home with home health.  Vascular intervention likely today              Anticipated d/c date is: >3 days              Patient currently is not medically stable to d/c.  Consultants:  PCCM,  general surgery,   Nephrology  Vascular surgery  ID   Procedures:  Hemodialysis   Thoracocentesis left on 10/03/19  Antibiotics:  . Ceftazidime, vancomycin iv 6/20>6/22 . Zosyn 6/22>  Subjective: Today, patient was seen and examined at bedside.  Patient complains of mild cough.  Complains of mild abdominal pain  and foot discomfort bilaterally.  Awaiting for surgical intervention.  Objective: Vitals:   10/06/19 1000 10/06/19 1100  BP: (!) 144/91   Pulse: 88   Resp: 17   Temp:  97.8 F (36.6 C)  SpO2: 100%     Intake/Output Summary (Last 24 hours) at 10/06/2019 1137 Last data filed at 10/06/2019 1000 Gross per 24 hour  Intake 428.9 ml  Output 2500 ml  Net -2071.1 ml   Filed Weights   10/05/19 0422 10/05/19 1321 10/06/19 0500  Weight: 92.3 kg 93.9 kg 90.2 kg   Body mass index is 24.21 kg/m.   Physical Exam:  GENERAL: Patient is alert awake and oriented. Not in obvious distress.  Chronically ill, thinly built. HENT: No scleral pallor or icterus. Pupils equally reactive to light. Oral mucosa is moist NECK: is supple, no gross swelling noted. CHEST: Diminished breath sounds bilaterally, right chest wall hemodialysis catheter in place. CVS: S1 and S2 heard, no murmur. Regular rate and rhythm.  ABDOMEN: Soft, non-tender, bowel sounds are present.  Incisional would dressing in place, peritoneal dialysis catheter in place. EXTREMITIES: lower extremity edema, right foot mottling with cold toes, right great toe purplish discoloration, left great toe ulceration and gangrenous changes. CNS: Cranial nerves are intact. No focal motor deficits. SKIN: warm and dry , incisional wound dressing on the abdomen, bilateral lower extremity ischemic findings.  Data Review: I have personally reviewed the following laboratory data and studies,  CBC: Recent Labs  Lab 10/02/19 0219 10/04/19 0222 10/05/19 0630 10/06/19 0222  WBC 30.5* 13.3* 15.5* 10.2  HGB 7.7* 7.7* 8.0* 7.9*  HCT 24.1* 24.1* 26.0* 25.3*  MCV 95.3 95.3 95.6 95.1  PLT 249 216 215 545   Basic Metabolic Panel: Recent Labs  Lab 10/01/19 0643 10/02/19 0219 10/04/19 0222 10/05/19 0630 10/06/19 0222  NA 134* 136 135 131* 137  K 5.4* 3.6 4.1 4.8 4.0  CL 98 100 99 97* 107  CO2 22 27 26  21* 25  GLUCOSE 84 103* 96 114* 108*  BUN 38*  23 27* 37* 20  CREATININE 5.74* 3.27* 3.01* 3.88* 2.57*  CALCIUM 6.6* 7.1* 6.8* 6.8* 6.9*  MG 1.3* 1.6* 1.4* 2.2 1.9  PHOS 4.5 3.4 2.6 3.2 2.5   Liver Function Tests: Recent Labs  Lab 10/01/19 0643 10/04/19 0222 10/05/19 0630 10/06/19 0222  AST 52* 34 27 28  ALT 21 16 16 16   ALKPHOS 151* 272* 242* 216*  BILITOT 0.9 0.6 0.5 0.3  PROT 5.0* 5.1* 5.5* 5.4*  ALBUMIN 1.4* 1.5* 1.5* 1.5*   No results for input(s): LIPASE, AMYLASE in the last 168 hours. No results for input(s): AMMONIA in the last 168 hours. Cardiac Enzymes: No results for input(s): CKTOTAL, CKMB, CKMBINDEX, TROPONINI in the last 168 hours. BNP (last 3 results) Recent Labs    05/26/19 1542 08/22/19 1930 10/01/19 0643  BNP 322.8* 1,578.0* 3,857.9*    ProBNP (last 3 results) No results for input(s): PROBNP in the last 8760 hours.  CBG: Recent Labs  Lab 10/04/19 0011 10/04/19 0342 10/04/19 0638 10/04/19 1114 10/04/19 2020  GLUCAP 107* 89 95 101* 126*   Recent Results (from the past 240 hour(s))  MRSA PCR  Screening     Status: None   Collection Time: 10/01/19  8:18 AM   Specimen: Nasal Mucosa; Nasopharyngeal  Result Value Ref Range Status   MRSA by PCR NEGATIVE NEGATIVE Final    Comment:        The GeneXpert MRSA Assay (FDA approved for NASAL specimens only), is one component of a comprehensive MRSA colonization surveillance program. It is not intended to diagnose MRSA infection nor to guide or monitor treatment for MRSA infections. Performed at [facility], [place] [address], [place], [place] [address]   Body fluid culture     Status: None   Collection Time: 10/01/19  1:20 PM   Specimen: Body Fluid  Result Value Ref Range Status   Specimen Description FLUID PERITONEAL DIALYSIS  Final   Special Requests Immunocompromised  Final   Gram Stain   Final    RARE WBC PRESENT, PREDOMINANTLY PMN NO ORGANISMS SEEN Performed at [facility], [address] [place], [place] [address]     Culture   Final    FEW ENTEROCOCCUS FAECALIS FEW SERRATIA MARCESCENS    Report Status 10/04/2019 FINAL  Final   Organism ID, Bacteria ENTEROCOCCUS FAECALIS  Final   Organism ID, Bacteria SERRATIA MARCESCENS  Final      Susceptibility   Enterococcus faecalis - MIC*    AMPICILLIN <=2 SENSITIVE Sensitive     VANCOMYCIN 1 SENSITIVE Sensitive     GENTAMICIN SYNERGY RESISTANT Resistant     * FEW ENTEROCOCCUS FAECALIS   Serratia marcescens - MIC*    CEFAZOLIN >=64 RESISTANT Resistant     CEFEPIME <=0.12 SENSITIVE Sensitive     CEFTAZIDIME <=1 SENSITIVE Sensitive     CEFTRIAXONE <=0.25 SENSITIVE Sensitive     CIPROFLOXACIN <=0.25 SENSITIVE Sensitive     GENTAMICIN <=1 SENSITIVE Sensitive     TRIMETH/SULFA <=20 SENSITIVE Sensitive     * FEW SERRATIA MARCESCENS  Body fluid culture (includes gram stain)     Status: None   Collection Time: 10/03/19  2:25 PM   Specimen: Pleural Fluid  Result Value Ref Range Status   Specimen Description FLUID PLEURAL LEFT  Final   Special Requests NONE  Final   Gram Stain   Final    WBC PRESENT,BOTH PMN AND MONONUCLEAR NO ORGANISMS SEEN CYTOSPIN SMEAR    Culture   Final    NO GROWTH 3 DAYS Performed at [facility], [address] [place], [place] [address]    Report Status 10/06/2019 FINAL  Final  Body fluid culture     Status: None (Preliminary result)   Collection Time: 10/04/19 10:30 AM   Specimen: Peritoneal Dialysate; Body Fluid  Result Value Ref Range Status   Specimen Description PERITONEAL DIALYSATE  Final   Special Requests NONE  Final   Gram Stain   Final    ABUNDANT WBC PRESENT,BOTH PMN AND MONONUCLEAR NO ORGANISMS SEEN    Culture MODERATE SERRATIA MARCESCENS  Final   Report Status PENDING  Incomplete   Organism ID, Bacteria SERRATIA MARCESCENS  Final      Susceptibility   Serratia marcescens - MIC*    CEFAZOLIN >=64 RESISTANT Resistant     CEFEPIME 0.5 SENSITIVE Sensitive     CEFTAZIDIME <=1 SENSITIVE Sensitive      CEFTRIAXONE 0.5 SENSITIVE Sensitive     CIPROFLOXACIN <=0.25 SENSITIVE Sensitive     GENTAMICIN <=1 SENSITIVE Sensitive     TRIMETH/SULFA Value in next row Sensitive      <=20  SENSITIVEPerformed at [facility], [place] [address] [place], [place] [address]    * MODERATE SERRATIA MARCESCENS  Surgical pcr screen     Status: None   Collection Time: 10/05/19  9:53 PM   Specimen: Nasal Mucosa; Nasal Swab  Result Value Ref Range Status   MRSA, PCR NEGATIVE NEGATIVE Final   Staphylococcus aureus NEGATIVE NEGATIVE Final    Comment: (NOTE) The Xpert SA Assay (FDA approved for NASAL specimens in patients 72 years of age and older), is one component of a comprehensive surveillance program. It is not intended to diagnose infection nor to guide or monitor treatment. Performed at [facility], [place] [address] [place], [place] [address]      Studies: CT ANGIO AO+BIFEM W & OR WO CONTRAST  Result Date: 10/04/2019 CLINICAL DATA:  Claudication or leg ischemia, prior revascularization. Absent femoral pulse. EXAM: CT ANGIOGRAPHY OF ABDOMINAL AORTA WITH ILIOFEMORAL RUNOFF TECHNIQUE: Multidetector CT imaging of the chest, abdomen, pelvis and lower extremities was performed using the standard protocol during bolus administration of intravenous contrast. Multiplanar CT image reconstructions and MIPs were obtained to evaluate the vascular anatomy. CONTRAST:  115mL OMNIPAQUE IOHEXOL 350 MG/ML SOLN COMPARISON:  Abdominal CT 09/03/2019. FINDINGS: Chest CTA: Vascular: Left internal jugular dialysis catheter tip in the right atrium. Aortic atherosclerosis with both calcified and noncalcified plaque. Some irregular plaque involving the distal transverse aorta. No evidence of acute aortic syndrome or penetrating ulcer. No aneurysm or aortic dissection. Conventional branching pattern from the aortic arch. 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  Moderate atherosclerosis of the additional aortic branch vessels. No central pulmonary embolus to the segmental level. Multi chamber cardiomegaly. No significant pericardial effusion. Mediastinum/nodes: Few scattered prominent mediastinal and hilar nodes that are not enlarged by size criteria. No esophageal wall thickening. No visualized thyroid nodule. Lungs: Moderate to advanced emphysema. Small to moderate bilateral pleural effusions. There is adjacent compressive atelectasis. There also tree in bud opacities in the left lower lobe, a dependent lingula and right upper lobe. There is debris within the right mainstem bronchus. No dominant pulmonary mass. Musculoskeletal: Heterogeneous subcutaneous fluid in the low left axilla in left abdominal wall without peripherally enhancing or focal fluid collection. Degenerative change throughout the thoracic spine. There are no acute or suspicious osseous abnormalities. Abdominal CTA: VASCULAR Aorta: No aneurysm or dissection. No evidence of vasculitis or acute abnormality. Moderate calcified noncalcified atheromatous plaque. Circumferential plaque in the infrarenal aorta causes minor luminal narrowing. Celiac: Mild plaque at the origin does not cause significant stenosis. The branch vessels are patent. SMA: Calcified noncalcified plaque in the origin and proximal vessel without significant luminal narrowing. Replaced right hepatic artery arises from the SMA. More distal branch vessels are patent. Renals: Diffusely diseased, diminutive in caliber. Multifocal areas of high-grade stenosis of both renal arteries. IMA: Stenosis at the origin but otherwise patent. RIGHT Lower Extremity Inflow: Moderate plaque involving the common iliac artery. Internal iliac artery is occluded at the origin this in distal reconstitution from collaterals. Stent in the external iliac artery which is patent. Outflow: Common femoral and profundus femoral arteries are patent. There is irregular plaque  in the profundus femoral artery with areas of segmental narrowing. Stent in the proximal superficial femoral artery which is patent. The right superficial femoral artery is diffusely diseased, small in caliber with multiple areas of high-grade stenosis. Greater than 50% stenosis involving the popliteal artery. Runoff: Patent 2 vessel runoff to  the ankle. LEFT Lower Extremity Inflow: Mix plaque causes greater than 50% stenosis of the left common iliac artery. The internal iliac artery is patent. The external iliac artery is occluded, also seen on prior exam. There is minimal distal reconstitution at the common femoral which is diminutive in caliber. Outflow: Minimal flow in the common femoral artery which is completely occluded over approximately 6 cm. The superficial femoral artery is completely occluded. Apparent stent in the distal superficial femoral artery is also occluded. There is some collateral fluid to the lower extremity. Popliteal artery is reconstituted but diminutive in caliber. Runoff: Patent three vessel runoff to the ankle. Veins: No obvious venous abnormality within the limitations of this arterial phase study. The portal vein is patent. Review of the MIP images confirms the above findings. NON-VASCULAR Hepatobiliary: Multiple low-density lesions throughout the liver, grossly similar imaging last month, not well assessed on arterial phase imaging. Gallbladder physiologically distended, no calcified stone. No biliary dilatation. Pancreas: Choose Spleen: Normal in size and arterial enhancement. Adrenals/Urinary Tract: No adrenal nodule. Enlarged polycystic kidneys. Scattered renal vascular and parenchymal calcifications. No hydronephrosis. Urinary bladder is minimally distended. Stomach/Bowel: Stomach is unremarkable. There is mild hyperemia of small bowel loops and diffuse wall thickening without evidence of obstruction. Few small bowel loops opposed the anterior abdominal wall at site of abdominal  wall defect. No obstruction. Normal appendix. Stool throughout the colon without colonic wall thickening or inflammation. Scattered colonic diverticula. Lymphatic: Few scattered bilateral common and external iliac lymph nodes that are not enlarged by size criteria. There is no evidence of bulky adenopathy. Reproductive: Prostate is unremarkable. Other: Peritoneal dialysis catheter in place with tip in the lower abdomen. There is ill-defined fluid insinuating between small bowel loops and small volume of abdominal ascites. No evidence of abscess or focal fluid collection. There are few scattered foci of extraluminal air within the anterior abdomen, for example series 10, image 130. This may be related to presence of peritoneal dialysis catheter, but are nonspecific. Midline abdominal wound with small amount of fluid along the subcutaneous portion of the catheter. There is bilateral flank edema. Musculoskeletal: Chronic partial fusion of L1-L2. Diffuse degenerative and postsurgical change in the lumbar spine. Degenerative change involving the right hip. IMPRESSION: Vascular: 1. 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 2. Chronically occluded left external iliac artery, minimal distal reconstitution. Occluded left superficial femoral artery. Distal reconstitution at the popliteal with three-vessel runoff to the left foot. 3. Diffusely diseased right superficial femoral artery with multifocal areas of high-grade stenosis. There is 2 vessel runoff to the right ankle. Nonvascular: 1. Mild hyperemia of small bowel loops and diffuse wall thickening without evidence of obstruction, may be secondary to enteritis. 2. Non organized ascites/free fluid in the abdomen. Few foci of extraluminal air in the upper abdomen are likely related to presence of peritoneal dialysis catheter. 3. Small to moderate bilateral pleural effusions with adjacent compressive  atelectasis. 4. Tree in bud opacities in the left lower lobe and to a lesser extent lingula and dependent right upper lobe suggestive of bronchiolitis. 5. Moderate to advanced emphysema. 6. Additional chronic findings as described. Aortic Atherosclerosis (ICD10-I70.0) and Emphysema (ICD10-J43.9). Electronically Signed   By: Keith Rake M.D.   On: 10/04/2019 20:02   CT ANGIO CHEST AORTA W/CM & OR WO/CM  Result Date: 10/04/2019 CLINICAL DATA:  Claudication or leg ischemia, prior revascularization. Absent femoral pulse. EXAM: CT ANGIOGRAPHY OF ABDOMINAL AORTA WITH ILIOFEMORAL  RUNOFF TECHNIQUE: Multidetector CT imaging of the chest, abdomen, pelvis and lower extremities was performed using the standard protocol during bolus administration of intravenous contrast. Multiplanar CT image reconstructions and MIPs were obtained to evaluate the vascular anatomy. CONTRAST:  119mL OMNIPAQUE IOHEXOL 350 MG/ML SOLN COMPARISON:  Abdominal CT 09/03/2019. FINDINGS: Chest CTA: Vascular: Left internal jugular dialysis catheter tip in the right atrium. Aortic atherosclerosis with both calcified and noncalcified plaque. Some irregular plaque involving the distal transverse aorta. No evidence of acute aortic syndrome or penetrating ulcer. No aneurysm or aortic dissection. Conventional branching pattern from the aortic arch. 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 Moderate atherosclerosis of the additional aortic branch vessels. No central pulmonary embolus to the segmental level. Multi chamber cardiomegaly. No significant pericardial effusion. Mediastinum/nodes: Few scattered prominent mediastinal and hilar nodes that are not enlarged by size criteria. No esophageal wall thickening. No visualized thyroid nodule. Lungs: Moderate to advanced emphysema. Small to moderate bilateral pleural effusions. There is adjacent compressive atelectasis. There also tree in bud  opacities in the left lower lobe, a dependent lingula and right upper lobe. There is debris within the right mainstem bronchus. No dominant pulmonary mass. Musculoskeletal: Heterogeneous subcutaneous fluid in the low left axilla in left abdominal wall without peripherally enhancing or focal fluid collection. Degenerative change throughout the thoracic spine. There are no acute or suspicious osseous abnormalities. Abdominal CTA: VASCULAR Aorta: No aneurysm or dissection. No evidence of vasculitis or acute abnormality. Moderate calcified noncalcified atheromatous plaque. Circumferential plaque in the infrarenal aorta causes minor luminal narrowing. Celiac: Mild plaque at the origin does not cause significant stenosis. The branch vessels are patent. SMA: Calcified noncalcified plaque in the origin and proximal vessel without significant luminal narrowing. Replaced right hepatic artery arises from the SMA. More distal branch vessels are patent. Renals: Diffusely diseased, diminutive in caliber. Multifocal areas of high-grade stenosis of both renal arteries. IMA: Stenosis at the origin but otherwise patent. RIGHT Lower Extremity Inflow: Moderate plaque involving the common iliac artery. Internal iliac artery is occluded at the origin this in distal reconstitution from collaterals. Stent in the external iliac artery which is patent. Outflow: Common femoral and profundus femoral arteries are patent. There is irregular plaque in the profundus femoral artery with areas of segmental narrowing. Stent in the proximal superficial femoral artery which is patent. The right superficial femoral artery is diffusely diseased, small in caliber with multiple areas of high-grade stenosis. Greater than 50% stenosis involving the popliteal artery. Runoff: Patent 2 vessel runoff to the ankle. LEFT Lower Extremity Inflow: Mix plaque causes greater than 50% stenosis of the left common iliac artery. The internal iliac artery is patent. The  external iliac artery is occluded, also seen on prior exam. There is minimal distal reconstitution at the common femoral which is diminutive in caliber. Outflow: Minimal flow in the common femoral artery which is completely occluded over approximately 6 cm. The superficial femoral artery is completely occluded. Apparent stent in the distal superficial femoral artery is also occluded. There is some collateral fluid to the lower extremity. Popliteal artery is reconstituted but diminutive in caliber. Runoff: Patent three vessel runoff to the ankle. Veins: No obvious venous abnormality within the limitations of this arterial phase study. The portal vein is patent. Review of the MIP images confirms the above findings. NON-VASCULAR Hepatobiliary: Multiple low-density lesions throughout the liver, grossly similar imaging last month, not well assessed on arterial phase imaging. Gallbladder physiologically distended,  no calcified stone. No biliary dilatation. Pancreas: Choose Spleen: Normal in size and arterial enhancement. Adrenals/Urinary Tract: No adrenal nodule. Enlarged polycystic kidneys. Scattered renal vascular and parenchymal calcifications. No hydronephrosis. Urinary bladder is minimally distended. Stomach/Bowel: Stomach is unremarkable. There is mild hyperemia of small bowel loops and diffuse wall thickening without evidence of obstruction. Few small bowel loops opposed the anterior abdominal wall at site of abdominal wall defect. No obstruction. Normal appendix. Stool throughout the colon without colonic wall thickening or inflammation. Scattered colonic diverticula. Lymphatic: Few scattered bilateral common and external iliac lymph nodes that are not enlarged by size criteria. There is no evidence of bulky adenopathy. Reproductive: Prostate is unremarkable. Other: Peritoneal dialysis catheter in place with tip in the lower abdomen. There is ill-defined fluid insinuating between small bowel loops and small volume  of abdominal ascites. No evidence of abscess or focal fluid collection. There are few scattered foci of extraluminal air within the anterior abdomen, for example series 10, image 130. This may be related to presence of peritoneal dialysis catheter, but are nonspecific. Midline abdominal wound with small amount of fluid along the subcutaneous portion of the catheter. There is bilateral flank edema. Musculoskeletal: Chronic partial fusion of L1-L2. Diffuse degenerative and postsurgical change in the lumbar spine. Degenerative change involving the right hip. IMPRESSION: Vascular: 1. 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 2. Chronically occluded left external iliac artery, minimal distal reconstitution. Occluded left superficial femoral artery. Distal reconstitution at the popliteal with three-vessel runoff to the left foot. 3. Diffusely diseased right superficial femoral artery with multifocal areas of high-grade stenosis. There is 2 vessel runoff to the right ankle. Nonvascular: 1. Mild hyperemia of small bowel loops and diffuse wall thickening without evidence of obstruction, may be secondary to enteritis. 2. Non organized ascites/free fluid in the abdomen. Few foci of extraluminal air in the upper abdomen are likely related to presence of peritoneal dialysis catheter. 3. Small to moderate bilateral pleural effusions with adjacent compressive atelectasis. 4. Tree in bud opacities in the left lower lobe and to a lesser extent lingula and dependent right upper lobe suggestive of bronchiolitis. 5. Moderate to advanced emphysema. 6. Additional chronic findings as described. Aortic Atherosclerosis (ICD10-I70.0) and Emphysema (ICD10-J43.9). Electronically Signed   By: Keith Rake M.D.   On: 10/04/2019 20:02   VAS [place] LOWER EXTREMITY SAPHENOUS VEIN MAPPING  Result Date: 10/05/2019 LOWER EXTREMITY VEIN MAPPING Indications:       pre op Other  Indications: PAD Risk Factors:      Hypertension, hyperlipidemia, coronary artery disease.  Comparison Study: no prior Performing Technologist: Abram Sander RVS  Examination Guidelines: A complete evaluation includes B-mode imaging, spectral Doppler, color Doppler, and power Doppler as needed of all accessible portions of each vessel. Bilateral testing is considered an integral part of a complete examination. Limited examinations for reoccurring indications may be performed as noted. +---------------+-------------+---------------------+--------------+-----------+   RT Diameter   RT Findings          GSV          LT Diameter  LT Findings      (cm)                                             (cm)                 +---------------+-------------+---------------------+--------------+-----------+  0.47                      Saphenofemoral                                                                Junction                                 +---------------+-------------+---------------------+--------------+-----------+      0.37                      Proximal thigh                              +---------------+-------------+---------------------+--------------+-----------+      0.29                         Mid thigh                                +---------------+-------------+---------------------+--------------+-----------+      0.24                       Distal thigh                               +---------------+-------------+---------------------+--------------+-----------+      0.31                           Knee                                   +---------------+-------------+---------------------+--------------+-----------+      0.30                         Prox calf                                +---------------+-------------+---------------------+--------------+-----------+                  branches         Mid calf                                  +---------------+-------------+---------------------+--------------+-----------+                     not          Distal calf                                               visualized                                                 +---------------+-------------+---------------------+--------------+-----------+  not             Ankle                                                  visualized                                                 +---------------+-------------+---------------------+--------------+-----------+ Diagnosing physician: Curt Jews MD Electronically signed by Curt Jews MD on 10/05/2019 at 3:42:57 PM.    Final       Flora Lipps, MD  [facility] 10/06/2019

## 2019-10-06 NOTE — Anesthesia Procedure Notes (Signed)
Procedure Name: Intubation Date/Time: 10/06/2019 3:41 PM Performed by: Inda Coke, CRNA Pre-anesthesia Checklist: Patient identified, Emergency Drugs available, Suction available and Patient being monitored Patient Re-evaluated:Patient Re-evaluated prior to induction Oxygen Delivery Method: Circle System Utilized Preoxygenation: Pre-oxygenation with 100% oxygen Induction Type: IV induction Ventilation: Mask ventilation without difficulty and Oral airway inserted - appropriate to patient size Laryngoscope Size: Mac and 4 Grade View: Grade I Tube type: Oral Tube size: 7.5 mm Number of attempts: 1 Airway Equipment and Method: Stylet and Oral airway Placement Confirmation: ETT inserted through vocal cords under direct vision,  positive ETCO2 and breath sounds checked- equal and bilateral Secured at: 22 cm Tube secured with: Tape Dental Injury: Teeth and Oropharynx as per pre-operative assessment

## 2019-10-06 NOTE — Progress Notes (Signed)
ANTICOAGULATION CONSULT NOTE  Pharmacy Consult for heparin  Indication: atheroembolism  No Known Allergies  Patient Measurements: Weight: 90.2 kg (198 lb 13.7 oz)  Vital Signs: Temp: 97.4 F (36.3 C) (06/24 2145) Temp Source: Oral (06/24 1100) BP: 110/75 (06/24 2200) Pulse Rate: 88 (06/24 2200)  Labs: Recent Labs    10/04/19 0222 10/04/19 0222 10/05/19 0630 10/05/19 1901 10/06/19 0222  HGB 7.7*   < > 8.0*  --  7.9*  HCT 24.1*  --  26.0*  --  25.3*  PLT 216  --  215  --  230  APTT  --   --  59* 59* 82*  HEPARINUNFRC  --   --  >2.20* >2.20* >2.20*  CREATININE 3.01*  --  3.88*  --  2.57*   < > = values in this interval not displayed.    Estimated Creatinine Clearance: 36.1 mL/min (A) (by C-G formula based on SCr of 2.57 mg/dL (H)).   Medical History: Past Medical History:  Diagnosis Date  . Anxiety   . Arthritis    knees , back , shoulders (10/12/2017)  . Atrial fibrillation ([place])   . CAD (coronary artery disease)    Mild nonobstructive disease at cardiac catheterization 2002  . Chronic back pain    "back of my neck; down thru my legs" (10/12/2017)  . COPD (chronic obstructive pulmonary disease) ([place])   . Diverticulitis   . Esophageal reflux   . ESRD (end stage renal disease) on dialysis [facility])    "TTS; [place]" (11/23/2017)  . Essential hypertension   . Hepatitis C    states he no longer has this  . History of kidney stones   . History of syncope   . Hyperlipidemia   . Jerking 09/23/2014  . Myocardial infarction ([place]) 02/2017   "light one" (10/12/2017)  . Non-compliant behavior    non compliant with diaylsis per daughter  . PAT (paroxysmal atrial tachycardia) ([place])   . Peripheral arterial disease ([place])    Occluded left superficial femoral artery status post stent June 2016 - Dr. Trula Slade  . Pneumonia 1961  . Polycystic kidney, unspecified type   . Syncope 09/2014  . SYNCOPE 05/07/2010   Qualifier: Diagnosis of  By: Laurance Flatten, RN, BSN, [place]      Medications:   Medications Prior to Admission  Medication Sig Dispense Refill Last Dose  . albuterol (PROVENTIL HFA;VENTOLIN HFA) 108 (90 Base) MCG/ACT inhaler Inhale 2 puffs into the lungs every 6 (six) hours as needed for wheezing or shortness of breath.   Past Week at Unknown time  . albuterol (PROVENTIL) (2.5 MG/3ML) 0.083% nebulizer solution Inhale 3 mLs into the lungs 3 (three) times daily as needed for shortness of breath or wheezing.   09/30/2019 at Unknown time  . apixaban (ELIQUIS) 5 MG TABS tablet Take 1 tablet (5 mg total) by mouth 2 (two) times daily. 60 tablet 1 09/29/2019 at 2100  . atorvastatin (LIPITOR) 80 MG tablet Take 80 mg by mouth daily.   09/29/2019 at Unknown time  . BREO ELLIPTA 200-25 MCG/INH AEPB Inhale 1 puff into the lungs daily as needed (for respiratory issues.).    Past Week at Unknown time  . calcium acetate (PHOSLO) 667 MG capsule Take 667 mg by mouth 3 (three) times daily with meals.   09/29/2019  . Darbepoetin Alfa (ARANESP) 60 MCG/0.3ML SOSY injection Inject 0.3 mLs (60 mcg total) into the vein every Wednesday with hemodialysis. 4.2 mL  09/28/2019  . diltiazem (CARDIZEM CD) 180 MG  24 hr capsule Take 1 capsule (180 mg total) by mouth daily. 90 capsule 1 09/29/2019  . docusate sodium (COLACE) 100 MG capsule Take 1 capsule (100 mg total) by mouth daily. 10 capsule 0 09/29/2019  . gabapentin (NEURONTIN) 300 MG capsule Take 1 capsule (300 mg total) by mouth at bedtime. 90 capsule 1 09/29/2019  . metoprolol tartrate (LOPRESSOR) 25 MG tablet Take 1 tablet (25 mg total) by mouth 2 (two) times daily. 180 tablet 1 09/29/2019 at 2100  . multivitamin (RENA-VIT) TABS tablet Take 1 tablet by mouth at bedtime. 90 tablet 1 09/29/2019  . ondansetron (ZOFRAN) 4 MG tablet Take 1 tablet (4 mg total) by mouth every 8 (eight) hours as needed for nausea. 20 tablet 0 Past Week at Unknown time  . oxyCODONE-acetaminophen (PERCOCET) 10-325 MG tablet Take 1 tablet by mouth 5 (five) times daily as needed for pain.     09/29/2019  . pantoprazole (PROTONIX) 40 MG tablet Take 1 tablet (40 mg total) by mouth 2 (two) times daily. (Patient taking differently: Take 40 mg by mouth daily. ) 60 tablet 1 09/29/2019  . senna-docusate (SENOKOT-S) 8.6-50 MG tablet Take 1 tablet by mouth 2 (two) times daily as needed for moderate constipation.   Past Week at Unknown time  . varenicline (CHANTIX) 0.5 MG tablet Take 0.5 mg by mouth 2 (two) times daily.   09/29/2019    Assessment: 64 yo male R foot ischemia. He is on apixaban for afib (last dose ~ 9am on 6/22). Pharmacy to dose heparin for atheroembolism and possible procedures  He is now s/p OR. Spoke to Dr. Donzetta Matters. No heparin now. Plans are to re-assess in am.   Goal of Therapy:  Heparin level 0.3-0.7 units/ml aPTT 66-102 seconds Monitor platelets by anticoagulation protocol: Yes   Plan:  -Hold heparin  -Will follow patient progress  Hildred Laser, PharmD Clinical Pharmacist **Pharmacist phone directory can now be found on [email or web address] ([email or web address]).  Listed under [place].

## 2019-10-07 DIAGNOSIS — K659 Peritonitis, unspecified: Secondary | ICD-10-CM

## 2019-10-07 LAB — PATHOLOGIST SMEAR REVIEW

## 2019-10-07 LAB — BASIC METABOLIC PANEL
Anion gap: 12 (ref 5–15)
Anion gap: 10 (ref 5–15)
BUN: 30 mg/dL — ABNORMAL HIGH (ref 8–23)
BUN: 30 mg/dL — ABNORMAL HIGH (ref 8–23)
CO2: 23 mmol/L (ref 22–32)
CO2: 24 mmol/L (ref 22–32)
Calcium: 6.5 mg/dL — ABNORMAL LOW (ref 8.9–10.3)
Calcium: 6.5 mg/dL — ABNORMAL LOW (ref 8.9–10.3)
Chloride: 100 mmol/L (ref 98–111)
Chloride: 101 mmol/L (ref 98–111)
Creatinine, Ser: 3.32 mg/dL — ABNORMAL HIGH (ref 0.61–1.24)
Creatinine, Ser: 2.94 mg/dL — ABNORMAL HIGH (ref 0.61–1.24)
GFR calc Af Amer: 22 mL/min — ABNORMAL LOW (ref >60)
GFR calc Af Amer: 25 mL/min — ABNORMAL LOW (ref >60)
GFR calc non Af Amer: 19 mL/min — ABNORMAL LOW (ref >60)
GFR calc non Af Amer: 22 mL/min — ABNORMAL LOW (ref >60)
Glucose, Bld: 102 mg/dL — ABNORMAL HIGH (ref 70–99)
Glucose, Bld: 98 mg/dL (ref 70–99)
Potassium: 4.3 mmol/L (ref 3.5–5.1)
Potassium: 4.3 mmol/L (ref 3.5–5.1)
Sodium: 135 mmol/L (ref 135–145)
Sodium: 135 mmol/L (ref 135–145)

## 2019-10-07 LAB — POCT I-STAT 7, (LYTES, BLD GAS, ICA,H+H)
Acid-Base Excess: 3 mmol/L — ABNORMAL HIGH (ref 0.0–2.0)
Acid-Base Excess: 2 mmol/L (ref 0.0–2.0)
Bicarbonate: 27.3 mmol/L (ref 20.0–28.0)
Bicarbonate: 25.6 mmol/L (ref 20.0–28.0)
Calcium, Ion: 0.98 mmol/L — ABNORMAL LOW (ref 1.15–1.40)
Calcium, Ion: 0.93 mmol/L — ABNORMAL LOW (ref 1.15–1.40)
HCT: 22 % — ABNORMAL LOW (ref 39.0–52.0)
HCT: 24 % — ABNORMAL LOW (ref 39.0–52.0)
Hemoglobin: 7.5 g/dL — ABNORMAL LOW (ref 13.0–17.0)
Hemoglobin: 8.2 g/dL — ABNORMAL LOW (ref 13.0–17.0)
O2 Saturation: 99 %
O2 Saturation: 97 %
Patient temperature: 36.2
Patient temperature: 36.8
Potassium: 4 mmol/L (ref 3.5–5.1)
Potassium: 4.7 mmol/L (ref 3.5–5.1)
Sodium: 136 mmol/L (ref 135–145)
Sodium: 136 mmol/L (ref 135–145)
TCO2: 28 mmol/L (ref 22–32)
TCO2: 27 mmol/L (ref 22–32)
pCO2 arterial: 38.1 mmHg (ref 32.0–48.0)
pCO2 arterial: 36.2 mmHg (ref 32.0–48.0)
pH, Arterial: 7.46 — ABNORMAL HIGH (ref 7.350–7.450)
pH, Arterial: 7.458 — ABNORMAL HIGH (ref 7.350–7.450)
pO2, Arterial: 147 mmHg — ABNORMAL HIGH (ref 83.0–108.0)
pO2, Arterial: 84 mmHg (ref 83.0–108.0)

## 2019-10-07 LAB — TYPE AND SCREEN
ABO/RH(D): O POS
Antibody Screen: NEGATIVE
Unit division: 0
Unit division: 0

## 2019-10-07 LAB — LIPID PANEL
Cholesterol: 87 mg/dL (ref 0–200)
HDL: 27 mg/dL — ABNORMAL LOW (ref >40)
LDL Cholesterol: 40 mg/dL (ref 0–99)
Total CHOL/HDL Ratio: 3.2 RATIO
Triglycerides: 99 mg/dL (ref <150)
VLDL: 20 mg/dL (ref 0–40)

## 2019-10-07 LAB — BPAM PLATELET PHERESIS
Blood Product Expiration Date: 202106272359
Blood Product Expiration Date: 202106272359
ISSUE DATE / TIME: 202106250712
Unit Type and Rh: 6200
Unit Type and Rh: 6200

## 2019-10-07 LAB — BPAM RBC
Blood Product Expiration Date: 202107262359
Blood Product Expiration Date: 202107262359
ISSUE DATE / TIME: 202106241617
ISSUE DATE / TIME: 202106241617
Unit Type and Rh: 5100
Unit Type and Rh: 5100

## 2019-10-07 LAB — PREPARE FRESH FROZEN PLASMA

## 2019-10-07 LAB — PREPARE PLATELET PHERESIS
Unit division: 0
Unit division: 0

## 2019-10-07 LAB — BPAM FFP
Blood Product Expiration Date: 202106292359
Blood Product Expiration Date: 202106292359
ISSUE DATE / TIME: 202106250340
Unit Type and Rh: 6200
Unit Type and Rh: 6200

## 2019-10-07 LAB — GLUCOSE, CAPILLARY
Glucose-Capillary: 118 mg/dL — ABNORMAL HIGH (ref 70–99)
Glucose-Capillary: 134 mg/dL — ABNORMAL HIGH (ref 70–99)

## 2019-10-07 LAB — CBC
HCT: 26.3 % — ABNORMAL LOW (ref 39.0–52.0)
HCT: 26.6 % — ABNORMAL LOW (ref 39.0–52.0)
Hemoglobin: 8.5 g/dL — ABNORMAL LOW (ref 13.0–17.0)
Hemoglobin: 8.5 g/dL — ABNORMAL LOW (ref 13.0–17.0)
MCH: 29.2 pg (ref 26.0–34.0)
MCH: 29.4 pg (ref 26.0–34.0)
MCHC: 32 g/dL (ref 30.0–36.0)
MCHC: 32.3 g/dL (ref 30.0–36.0)
MCV: 91 fL (ref 80.0–100.0)
MCV: 91.4 fL (ref 80.0–100.0)
Platelets: 228 10*3/uL (ref 150–400)
Platelets: 237 10*3/uL (ref 150–400)
RBC: 2.89 MIL/uL — ABNORMAL LOW (ref 4.22–5.81)
RBC: 2.91 MIL/uL — ABNORMAL LOW (ref 4.22–5.81)
RDW: 17.2 % — ABNORMAL HIGH (ref 11.5–15.5)
RDW: 17.2 % — ABNORMAL HIGH (ref 11.5–15.5)
WBC: 9.6 10*3/uL (ref 4.0–10.5)
WBC: 9.7 10*3/uL (ref 4.0–10.5)
nRBC: 0 % (ref 0.0–0.2)
nRBC: 0 % (ref 0.0–0.2)

## 2019-10-07 LAB — PROTIME-INR
INR: 1.2 (ref 0.8–1.2)
Prothrombin Time: 14.8 seconds (ref 11.4–15.2)

## 2019-10-07 LAB — APTT
aPTT: 31 seconds (ref 24–36)
aPTT: 79 seconds — ABNORMAL HIGH (ref 24–36)

## 2019-10-07 LAB — MAGNESIUM: Magnesium: 1.9 mg/dL (ref 1.7–2.4)

## 2019-10-07 LAB — PHOSPHORUS: Phosphorus: 4.9 mg/dL — ABNORMAL HIGH (ref 2.5–4.6)

## 2019-10-07 MED ORDER — CLOPIDOGREL BISULFATE 75 MG PO TABS
75.0000 mg | ORAL_TABLET | Freq: Every day | ORAL | Status: DC
  Administered 2019-10-07 – 2019-10-13 (×7): 75 mg via ORAL
  Filled 2019-10-07 (×7): qty 1

## 2019-10-07 MED ORDER — ONDANSETRON HCL 4 MG/2ML IJ SOLN
INTRAMUSCULAR | Status: AC
  Filled 2019-10-07: qty 2

## 2019-10-07 MED ORDER — SODIUM CHLORIDE 0.9 % IV SOLN
INTRAVENOUS | Status: DC | PRN
  Administered 2019-10-07 – 2019-10-10 (×6): 250 mL via INTRAVENOUS

## 2019-10-07 MED ORDER — HEPARIN SODIUM (PORCINE) 1000 UNIT/ML IJ SOLN
INTRAMUSCULAR | Status: AC
  Administered 2019-10-07: 4200 [IU] via ARTERIOVENOUS_FISTULA
  Filled 2019-10-07: qty 4

## 2019-10-07 NOTE — Progress Notes (Signed)
PHARMACIST LIPID MONITORING   Aaron [name] is a 64 y.o. male admitted on 10/01/2019 with syncope/respiratory arrest.  Pharmacy has been consulted to optimize lipid-lowering therapy with the indication of secondary prevention for clinical ASCVD.  Recent Labs:  Lipid Panel (last 6 months):   Lab Results  Component Value Date   CHOL 87 10/07/2019   TRIG 99 10/07/2019   HDL 27 (L) 10/07/2019   CHOLHDL 3.2 10/07/2019   VLDL 20 10/07/2019   LDLCALC 40 10/07/2019    Hepatic function panel (last 6 months):   Lab Results  Component Value Date   AST 28 10/06/2019   ALT 16 10/06/2019   ALKPHOS 216 (H) 10/06/2019   BILITOT 0.3 10/06/2019    SCr (since admission):   Serum creatinine: 2.94 mg/dL (H) 10/07/19 0343 Estimated creatinine clearance: 31.6 mL/min (A)  Current lipid-lowering therapy: Atorvastatin 80 mg Previous lipid-lowering therapies (if applicable): Atorvastatin 80 mg Documented or reported allergies or intolerances to lipid-lowering therapies (if applicable): none  Assessment:  Patient is excluded from the protocol due to ESRD (ESRD, elevated LFTs, pregnancy/breastfeeding, active liver disease)    Recommendation per protocol:  Continue current lipid-lowering therapy.  Follow-up with: none needed  Plan: Continue current therapy.  [place] Crane, Roylene Reason, BCCP Clinical Pharmacist  10/07/2019 9:19 AM   [facility] pharmacy phone numbers are listed on [email or web address]

## 2019-10-07 NOTE — Progress Notes (Signed)
PROGRESS NOTE  Aaron [name] WLN:[id number] DOB: 02-14-1956 DOA: 10/01/2019 PCP: Gwenlyn Saran, [place]   LOS: 6 days   Brief narrative: As per HPI,  64 y.o. Male with PMH of ESRD on peritoneal dialysis, history of  permanent Atrial fibrillation on Eliquis who was recently admitted for small bowel obstruction  status post exploratory laparatomy with retention sutures  was at dialysis on 6/18 when he became dizzy and had syncopal episode and respiratory arrest.  He was then sent to [facility] emergency department with he was noted to have elevated lactate at 6.6.  He was then transferred to [facility] when en route developed atrial fibrillation with rapid ventricular response and was given Cardizem iv which dropped his blood pressure.  He was then started on Neo-Synephrine and was admitted to the ICU.  Patient was subsequently stable and was transferred out of the ICU.   Subjective: Postop day #1 10/06/2019 status post -left common femoral endarterectomy angioplasty SFA and profunda femoris artery Stent to left common iliac artery, left external iliac artery, left lower extremity angiography, aortogram, right lower extremity angiography, stent to right SFA, left common femoral below-knee popliteal artery bypass  Today, patient was seen and examined at bedside.  Patient complains of mild cough.  Complains of mild abdominal pain and foot discomfort bilaterally.  Awaiting for surgical intervention.     Assessment/Plan:  Principal Problem:   Peritonitis ([place]) Active Problems:   Septic shock (HCC)   Shortness of breath   Atrial fibrillation (HCC)   S/P thoracentesis   Ischemic ulcer of toe of right foot ([place])   Bilateral lower extremity ischemia/edema, pain/  Postop day #1 10/06/2019 status post -left common femoral endarterectomy angioplasty SFA and profunda femoris artery Stent to left common iliac artery, left external iliac artery, left lower extremity  angiography, aortogram, right lower extremity angiography, stent to right SFA, left common femoral below-knee popliteal artery bypass -Stable today-reporting improved pain and swelling in the lower extremities -Positive pulses in the lower extremities bilaterally -Vascular team following, heparin DC'd    -History of iliofemoral endarterectomy. -underwent CT angiogram with left external iliac artery and common femoral artery occlusion and left lower extremity ischemia and left great toe ulceration.     Syncopal Episode with hypotension BP remained stable, no further episodes Possibly secondary to peritonitis, volume depletion.  Currently off pressors. On midodrine trial.  2D echocardiogram on 05/27/2019 was 50 to 55%.  Atrial Fibrillation/RVR Remained stable On presentation.  Seen by cardiology.  On Cardizem and Eliquis.  Beta-blockers on hold, could be restarted as needed.   Cardiology recommending outpatient follow-up with elective cardioversion after minimum of 3 weeks of anticoagulation if needed.  Follow-up recommended with Dr. Denman George, [facility].  Cardiology has signed off at this time.  Blood pressure has improved at this time.   Lactic acidosis, recent SBO s/p ex lap bowel resection , resolving.  peritonitis, septic shock  Peritoneal fluid cultures showing Enterococcus and Proteus.    Continue Zosyn as per ID.  Improved leukocytosis, remains afebrile, normotensive.  ESRD -Due for hemodialysis today 10/07/2019 Was on peritoneal dialysis, now on HD through left internal jugular dialysis catheter M/W/F.   Continue hemodialysis as per nephrology.  Left-sided pleural effusion, dyspnea on exertion dyspnea. Remained stable,  Pulmonary has started on short course of prednisone.   Status post left-sided thoracocentesis on 10/03/2019 with removal of 1200 mL of straw-colored fluid.  Appears to be transudative at this time. Appears comfortable  Debility, deconditioning.  Physical  therapy has been consulted.  Recommend Home health PT on discharge.   Patient remained stable, will transfer out of ICU today.    DVT prophylaxis: SCD's Start: 10/06/19 2256 SCDs Start: 10/01/19 0607 , heparin drip  Code Status: Full code  Family Communication: None today.   Status is: Inpatient  Remains inpatient appropriate because: Unsafe d/c plan, IV treatments appropriate due to intensity of illness or inability to take PO, Inpatient level of care appropriate due to severity of illness, ongoing hemodialysis, vascular surgery intervention,    Dispo: The patient is from: Home              Anticipated d/c is to: Likely home with home health.  Vascular intervention likely today              Anticipated d/c date is: >3 days              Patient currently is not medically stable to d/c.  Consultants:  PCCM,   general surgery,   Nephrology  Vascular surgery  ID   Procedures:  Hemodialysis   Thoracocentesis left on 10/03/19  Antibiotics:  . Ceftazidime, vancomycin iv 6/20>6/22 . Zosyn 6/22>   Objective: Vitals:   10/07/19 0858 10/07/19 1121  BP: 127/64   Pulse:    Resp:    Temp:  98.2 F (36.8 C)  SpO2:      Intake/Output Summary (Last 24 hours) at 10/07/2019 1324 Last data filed at 10/07/2019 1200 Gross per 24 hour  Intake 2752.62 ml  Output 1065 ml  Net 1687.62 ml   Filed Weights   10/05/19 1321 10/06/19 0500 10/07/19 0500  Weight: 93.9 kg 90.2 kg 93.2 kg   Body mass index is 25.01 kg/m.    Physical Exam  Constitution:  Alert, cooperative, no distress,  Psychiatric: Normal and stable mood and affect, cognition intact,   HEENT: Normocephalic, PERRL, otherwise with in Normal limits  Chest:Chest symmetric Cardio vascular:  S1/S2, RRR, No murmure, No Rubs or Gallops  pulmonary: Clear to auscultation bilaterally, respirations unlabored, negative wheezes / crackles Abdomen: Soft, non-tender, non-distended, bowel sounds,no masses, no  organomegaly Muscular skeletal: Limited exam - in bed, able to move all 4 extremities, Normal strength,  Neuro: CNII-XII intact. , normal motor and sensation, reflexes intact  Extremities: +3 L>R  pitting edema lower extremities, +2 pulses  Skin: Dry, warm to touch, negative for any Rashes, No open wounds Wounds: Postsurgical wound abdomen, lower extremity area clean, negative erythema edema or drainage -please see nurses documentation for details       Data Review: I have personally reviewed the following laboratory data and studies,  CBC: Recent Labs  Lab 10/04/19 0222 10/04/19 0222 10/05/19 0630 10/06/19 0222 10/06/19 1621 10/06/19 2347 10/07/19 0343  WBC 13.3*  --  15.5* 10.2  --  9.7 9.6  HGB 7.7*   < > 8.0* 7.9* 7.5* 8.5* 8.5*  HCT 24.1*   < > 26.0* 25.3* 22.0* 26.6* 26.3*  MCV 95.3  --  95.6 95.1  --  91.4 91.0  PLT 216  --  215 230  --  228 237   < > = values in this interval not displayed.   Basic Metabolic Panel: Recent Labs  Lab 10/02/19 0219 10/02/19 0219 10/04/19 0222 10/04/19 0222 10/05/19 0630 10/06/19 0222 10/06/19 1621 10/07/19 0117 10/07/19 0343  NA 136   < > 135   < > 131* 137 136 135 135  K 3.6   < > 4.1   < >  4.8 4.0 4.0 4.3 4.3  CL 100   < > 99  --  97* 107  --  100 101  CO2 27   < > 26  --  21* 25  --  23 24  GLUCOSE 103*   < > 96  --  114* 108*  --  102* 98  BUN 23   < > 27*  --  37* 20  --  30* 30*  CREATININE 3.27*   < > 3.01*  --  3.88* 2.57*  --  3.32* 2.94*  CALCIUM 7.1*   < > 6.8*  --  6.8* 6.9*  --  6.5* 6.5*  MG 1.6*  --  1.4*  --  2.2 1.9  --   --  1.9  PHOS 3.4  --  2.6  --  3.2 2.5  --   --  4.9*   < > = values in this interval not displayed.   Liver Function Tests: Recent Labs  Lab 10/01/19 0643 10/04/19 0222 10/05/19 0630 10/06/19 0222  AST 52* 34 27 28  ALT 21 16 16 16   ALKPHOS 151* 272* 242* 216*  BILITOT 0.9 0.6 0.5 0.3  PROT 5.0* 5.1* 5.5* 5.4*  ALBUMIN 1.4* 1.5* 1.5* 1.5*   No results for input(s): LIPASE,  AMYLASE in the last 168 hours. No results for input(s): AMMONIA in the last 168 hours. Cardiac Enzymes: No results for input(s): CKTOTAL, CKMB, CKMBINDEX, TROPONINI in the last 168 hours. BNP (last 3 results) Recent Labs    05/26/19 1542 08/22/19 1930 10/01/19 0643  BNP 322.8* 1,578.0* 3,857.9*    ProBNP (last 3 results) No results for input(s): PROBNP in the last 8760 hours.  CBG: Recent Labs  Lab 10/04/19 0638 10/04/19 1114 10/04/19 2020 10/06/19 2258 10/07/19 1119  GLUCAP 95 101* 126* 101* 134*   Recent Results (from the past 240 hour(s))  MRSA PCR Screening     Status: None   Collection Time: 10/01/19  8:18 AM   Specimen: Nasal Mucosa; Nasopharyngeal  Result Value Ref Range Status   MRSA by PCR NEGATIVE NEGATIVE Final    Comment:        The GeneXpert MRSA Assay (FDA approved for NASAL specimens only), is one component of a comprehensive MRSA colonization surveillance program. It is not intended to diagnose MRSA infection nor to guide or monitor treatment for MRSA infections. Performed at [facility], [place] [address] [place], [place] [address]   Body fluid culture     Status: None   Collection Time: 10/01/19  1:20 PM   Specimen: Body Fluid  Result Value Ref Range Status   Specimen Description FLUID PERITONEAL DIALYSIS  Final   Special Requests Immunocompromised  Final   Gram Stain   Final    RARE WBC PRESENT, PREDOMINANTLY PMN NO ORGANISMS SEEN Performed at [facility], [address] [place], [place] [address]    Culture   Final    FEW ENTEROCOCCUS FAECALIS FEW SERRATIA MARCESCENS    Report Status 10/04/2019 FINAL  Final   Organism ID, Bacteria ENTEROCOCCUS FAECALIS  Final   Organism ID, Bacteria SERRATIA MARCESCENS  Final      Susceptibility   Enterococcus faecalis - MIC*    AMPICILLIN <=2 SENSITIVE Sensitive     VANCOMYCIN 1 SENSITIVE Sensitive     GENTAMICIN SYNERGY RESISTANT Resistant     * FEW ENTEROCOCCUS FAECALIS   Serratia  marcescens - MIC*    CEFAZOLIN >=64 RESISTANT Resistant  CEFEPIME <=0.12 SENSITIVE Sensitive     CEFTAZIDIME <=1 SENSITIVE Sensitive     CEFTRIAXONE <=0.25 SENSITIVE Sensitive     CIPROFLOXACIN <=0.25 SENSITIVE Sensitive     GENTAMICIN <=1 SENSITIVE Sensitive     TRIMETH/SULFA <=20 SENSITIVE Sensitive     * FEW SERRATIA MARCESCENS  Body fluid culture (includes gram stain)     Status: None   Collection Time: 10/03/19  2:25 PM   Specimen: Pleural Fluid  Result Value Ref Range Status   Specimen Description FLUID PLEURAL LEFT  Final   Special Requests NONE  Final   Gram Stain   Final    WBC PRESENT,BOTH PMN AND MONONUCLEAR NO ORGANISMS SEEN CYTOSPIN SMEAR    Culture   Final    NO GROWTH 3 DAYS Performed at [facility], [address] [place], [place] [address]    Report Status 10/06/2019 FINAL  Final  Body fluid culture     Status: None (Preliminary result)   Collection Time: 10/04/19 10:30 AM   Specimen: Peritoneal Dialysate; Body Fluid  Result Value Ref Range Status   Specimen Description PERITONEAL DIALYSATE  Final   Special Requests NONE  Final   Gram Stain   Final    ABUNDANT WBC PRESENT,BOTH PMN AND MONONUCLEAR NO ORGANISMS SEEN Performed at [facility], [address] [place], [place] [address]    Culture   Final    MODERATE SERRATIA MARCESCENS FEW ENTEROCOCCUS FAECALIS    Report Status PENDING  Incomplete   Organism ID, Bacteria SERRATIA MARCESCENS  Final      Susceptibility   Serratia marcescens - MIC*    CEFAZOLIN >=64 RESISTANT Resistant     CEFEPIME 0.5 SENSITIVE Sensitive     CEFTAZIDIME <=1 SENSITIVE Sensitive     CEFTRIAXONE 0.5 SENSITIVE Sensitive     CIPROFLOXACIN <=0.25 SENSITIVE Sensitive     GENTAMICIN <=1 SENSITIVE Sensitive     TRIMETH/SULFA <=20 SENSITIVE Sensitive     * MODERATE SERRATIA MARCESCENS  Surgical pcr screen     Status: None   Collection Time: 10/05/19  9:53 PM   Specimen: Nasal Mucosa; Nasal Swab  Result Value  Ref Range Status   MRSA, PCR NEGATIVE NEGATIVE Final   Staphylococcus aureus NEGATIVE NEGATIVE Final    Comment: (NOTE) The Xpert SA Assay (FDA approved for NASAL specimens in patients 52 years of age and older), is one component of a comprehensive surveillance program. It is not intended to diagnose infection nor to guide or monitor treatment. Performed at [facility], [place] [address], [place], [place] [address]      Studies: VAS [place] LOWER EXTREMITY SAPHENOUS VEIN MAPPING  Result Date: 10/05/2019 LOWER EXTREMITY VEIN MAPPING Indications:       pre op Other Indications: PAD Risk Factors:      Hypertension, hyperlipidemia, coronary artery disease.  Comparison Study: no prior Performing Technologist: Abram Sander RVS  Examination Guidelines: A complete evaluation includes B-mode imaging, spectral Doppler, color Doppler, and power Doppler as needed of all accessible portions of each vessel. Bilateral testing is considered an integral part of a complete examination. Limited examinations for reoccurring indications may be performed as noted. Diagnosing physician: Curt Jews MD Electronically signed by Curt Jews MD on 10/05/2019 at 3:42:57 PM.    Final    HYBRID OR IMAGING ([place])  Result Date: 10/06/2019 There is no interpretation for this exam.  This order is for images obtained during a surgical procedure.  Please See "Surgeries"  Tab for more information regarding the procedure.      Deatra James, MD  [facility] 10/07/2019

## 2019-10-07 NOTE — Progress Notes (Signed)
ANTICOAGULATION CONSULT NOTE  Pharmacy Consult for heparin  Indication: atheroembolism  No Known Allergies  Patient Measurements: Height: 6\' 4"  (193 cm) Weight: 93.2 kg (205 lb 7.5 oz) IBW/kg (Calculated) : 86.8  Vital Signs: Temp: 98.7 F (37.1 C) (06/25 0700) Temp Source: Oral (06/25 0700) BP: 127/64 (06/25 0858) Pulse Rate: 94 (06/25 0530)  Labs: Recent Labs    10/05/19 0630 10/05/19 0630 10/05/19 1901 10/06/19 0222 10/06/19 0222 10/06/19 1621 10/06/19 1621 10/06/19 2347 10/07/19 0117 10/07/19 0343  HGB 8.0*   < >  --  7.9*   < > 7.5*   < > 8.5*  --  8.5*  HCT 26.0*   < >  --  25.3*   < > 22.0*  --  26.6*  --  26.3*  PLT 215   < >  --  230  --   --   --  228  --  237  APTT 59*   < > 59* 82*  --   --   --   --   --  31  LABPROT  --   --   --   --   --   --   --  14.8  --   --   INR  --   --   --   --   --   --   --  1.2  --   --   HEPARINUNFRC >2.20*  --  >2.20* >2.20*  --   --   --   --   --   --   CREATININE 3.88*   < >  --  2.57*  --   --   --   --  3.32* 2.94*   < > = values in this interval not displayed.    Estimated Creatinine Clearance: 31.6 mL/min (A) (by C-G formula based on SCr of 2.94 mg/dL (H)).   Medical History: Past Medical History:  Diagnosis Date  . Anxiety   . Arthritis    knees , back , shoulders (10/12/2017)  . Atrial fibrillation ([place])   . CAD (coronary artery disease)    Mild nonobstructive disease at cardiac catheterization 2002  . Chronic back pain    "back of my neck; down thru my legs" (10/12/2017)  . COPD (chronic obstructive pulmonary disease) ([place])   . Diverticulitis   . Esophageal reflux   . ESRD (end stage renal disease) on dialysis [facility])    "TTS; [place]" (11/23/2017)  . Essential hypertension   . Hepatitis C    states he no longer has this  . History of kidney stones   . History of syncope   . Hyperlipidemia   . Jerking 09/23/2014  . Myocardial infarction ([place]) 02/2017   "light one" (10/12/2017)  . Non-compliant behavior     non compliant with diaylsis per daughter  . PAT (paroxysmal atrial tachycardia) ([place])   . Peripheral arterial disease ([place])    Occluded left superficial femoral artery status post stent June 2016 - Dr. Trula Slade  . Pneumonia 1961  . Polycystic kidney, unspecified type   . Syncope 09/2014  . SYNCOPE 05/07/2010   Qualifier: Diagnosis of  By: Laurance Flatten, RN, BSN, [place]      Medications:  Medications Prior to Admission  Medication Sig Dispense Refill Last Dose  . albuterol (PROVENTIL HFA;VENTOLIN HFA) 108 (90 Base) MCG/ACT inhaler Inhale 2 puffs into the lungs every 6 (six) hours as needed for wheezing or shortness of breath.   Past Week at  Unknown time  . albuterol (PROVENTIL) (2.5 MG/3ML) 0.083% nebulizer solution Inhale 3 mLs into the lungs 3 (three) times daily as needed for shortness of breath or wheezing.   09/30/2019 at Unknown time  . apixaban (ELIQUIS) 5 MG TABS tablet Take 1 tablet (5 mg total) by mouth 2 (two) times daily. 60 tablet 1 09/29/2019 at 2100  . atorvastatin (LIPITOR) 80 MG tablet Take 80 mg by mouth daily.   09/29/2019 at Unknown time  . BREO ELLIPTA 200-25 MCG/INH AEPB Inhale 1 puff into the lungs daily as needed (for respiratory issues.).    Past Week at Unknown time  . calcium acetate (PHOSLO) 667 MG capsule Take 667 mg by mouth 3 (three) times daily with meals.   09/29/2019  . Darbepoetin Alfa (ARANESP) 60 MCG/0.3ML SOSY injection Inject 0.3 mLs (60 mcg total) into the vein every Wednesday with hemodialysis. 4.2 mL  09/28/2019  . diltiazem (CARDIZEM CD) 180 MG 24 hr capsule Take 1 capsule (180 mg total) by mouth daily. 90 capsule 1 09/29/2019  . docusate sodium (COLACE) 100 MG capsule Take 1 capsule (100 mg total) by mouth daily. 10 capsule 0 09/29/2019  . gabapentin (NEURONTIN) 300 MG capsule Take 1 capsule (300 mg total) by mouth at bedtime. 90 capsule 1 09/29/2019  . metoprolol tartrate (LOPRESSOR) 25 MG tablet Take 1 tablet (25 mg total) by mouth 2 (two) times daily. 180  tablet 1 09/29/2019 at 2100  . multivitamin (RENA-VIT) TABS tablet Take 1 tablet by mouth at bedtime. 90 tablet 1 09/29/2019  . ondansetron (ZOFRAN) 4 MG tablet Take 1 tablet (4 mg total) by mouth every 8 (eight) hours as needed for nausea. 20 tablet 0 Past Week at Unknown time  . oxyCODONE-acetaminophen (PERCOCET) 10-325 MG tablet Take 1 tablet by mouth 5 (five) times daily as needed for pain.    09/29/2019  . pantoprazole (PROTONIX) 40 MG tablet Take 1 tablet (40 mg total) by mouth 2 (two) times daily. (Patient taking differently: Take 40 mg by mouth daily. ) 60 tablet 1 09/29/2019  . senna-docusate (SENOKOT-S) 8.6-50 MG tablet Take 1 tablet by mouth 2 (two) times daily as needed for moderate constipation.   Past Week at Unknown time  . varenicline (CHANTIX) 0.5 MG tablet Take 0.5 mg by mouth 2 (two) times daily.   09/29/2019    Assessment: 64 yo male R foot ischemia. He is on apixaban for afib (last dose ~ 9am on 6/22). Pharmacy to dose heparin for atheroembolism and possible procedures  He is now s/p OR, heparin off overnight.  Spoke to [place] this AM, okay to resume IV heparin.  Goal of Therapy:  Heparin level 0.3-0.7 units/ml aPTT 66-102 seconds Monitor platelets by anticoagulation protocol: Yes   Plan:  -Restart IV heparin at previous rate of 1500 units/hr. -Check aPTT in 8 hrs. -Daily heparin level, aPTT, and CBC. -F/u plans for oral anticoagulation eventually (apixaban PTA).  [place] Crane, Roylene Reason, BCCP Clinical Pharmacist  10/07/2019 11:17 AM   [facility] pharmacy phone numbers are listed on [email or web address]

## 2019-10-07 NOTE — Progress Notes (Signed)
ANTICOAGULATION CONSULT NOTE  Pharmacy Consult for heparin  Indication: atheroembolism  No Known Allergies  Patient Measurements: Height: 6\' 4"  (193 cm) Weight: 91.7 kg (202 lb 2.6 oz) IBW/kg (Calculated) : 86.8  Vital Signs: Temp: 97.8 F (36.6 C) (06/25 2114) Temp Source: Oral (06/25 2114) BP: 150/91 (06/25 2114) Pulse Rate: 106 (06/25 2114)  Labs: Recent Labs    10/05/19 0630 10/05/19 0630 10/05/19 1901 10/06/19 0222 10/06/19 1621 10/06/19 1932 10/06/19 1932 10/06/19 2347 10/07/19 0117 10/07/19 0343 10/07/19 2150  HGB 8.0*   < >  --  7.9*   < > 8.2*   < > 8.5*  --  8.5*  --   HCT 26.0*   < >  --  25.3*   < > 24.0*  --  26.6*  --  26.3*  --   PLT 215   < >  --  230  --   --   --  228  --  237  --   APTT 59*   < > 59* 82*  --   --   --   --   --  31 79*  LABPROT  --   --   --   --   --   --   --  14.8  --   --   --   INR  --   --   --   --   --   --   --  1.2  --   --   --   HEPARINUNFRC >2.20*  --  >2.20* >2.20*  --   --   --   --   --   --   --   CREATININE 3.88*   < >  --  2.57*  --   --   --   --  3.32* 2.94*  --    < > = values in this interval not displayed.    Estimated Creatinine Clearance: 31.6 mL/min (A) (by C-G formula based on SCr of 2.94 mg/dL (H)).   Medical History: Past Medical History:  Diagnosis Date  . Anxiety   . Arthritis    knees , back , shoulders (10/12/2017)  . Atrial fibrillation ([place])   . CAD (coronary artery disease)    Mild nonobstructive disease at cardiac catheterization 2002  . Chronic back pain    "back of my neck; down thru my legs" (10/12/2017)  . COPD (chronic obstructive pulmonary disease) ([place])   . Diverticulitis   . Esophageal reflux   . ESRD (end stage renal disease) on dialysis [facility])    "TTS; [facility]" (11/23/2017)  . Essential hypertension   . Hepatitis C    states he no longer has this  . History of kidney stones   . History of syncope   . Hyperlipidemia   . Jerking 09/23/2014  . Myocardial infarction ([place])  02/2017   "light one" (10/12/2017)  . Non-compliant behavior    non compliant with diaylsis per daughter  . PAT (paroxysmal atrial tachycardia) ([place])   . Peripheral arterial disease ([place])    Occluded left superficial femoral artery status post stent June 2016 - Dr. Trula Slade  . Pneumonia 1961  . Polycystic kidney, unspecified type   . Syncope 09/2014  . SYNCOPE 05/07/2010   Qualifier: Diagnosis of  By: Laurance Flatten, RN, BSN, [place]      Medications:  Medications Prior to Admission  Medication Sig Dispense Refill Last Dose  . albuterol (PROVENTIL HFA;VENTOLIN HFA) 108 (90 Base) MCG/ACT  inhaler Inhale 2 puffs into the lungs every 6 (six) hours as needed for wheezing or shortness of breath.   Past Week at Unknown time  . albuterol (PROVENTIL) (2.5 MG/3ML) 0.083% nebulizer solution Inhale 3 mLs into the lungs 3 (three) times daily as needed for shortness of breath or wheezing.   09/30/2019 at Unknown time  . apixaban (ELIQUIS) 5 MG TABS tablet Take 1 tablet (5 mg total) by mouth 2 (two) times daily. 60 tablet 1 09/29/2019 at 2100  . atorvastatin (LIPITOR) 80 MG tablet Take 80 mg by mouth daily.   09/29/2019 at Unknown time  . BREO ELLIPTA 200-25 MCG/INH AEPB Inhale 1 puff into the lungs daily as needed (for respiratory issues.).    Past Week at Unknown time  . calcium acetate (PHOSLO) 667 MG capsule Take 667 mg by mouth 3 (three) times daily with meals.   09/29/2019  . Darbepoetin Alfa (ARANESP) 60 MCG/0.3ML SOSY injection Inject 0.3 mLs (60 mcg total) into the vein every Wednesday with hemodialysis. 4.2 mL  09/28/2019  . diltiazem (CARDIZEM CD) 180 MG 24 hr capsule Take 1 capsule (180 mg total) by mouth daily. 90 capsule 1 09/29/2019  . docusate sodium (COLACE) 100 MG capsule Take 1 capsule (100 mg total) by mouth daily. 10 capsule 0 09/29/2019  . gabapentin (NEURONTIN) 300 MG capsule Take 1 capsule (300 mg total) by mouth at bedtime. 90 capsule 1 09/29/2019  . metoprolol tartrate (LOPRESSOR) 25 MG tablet Take  1 tablet (25 mg total) by mouth 2 (two) times daily. 180 tablet 1 09/29/2019 at 2100  . multivitamin (RENA-VIT) TABS tablet Take 1 tablet by mouth at bedtime. 90 tablet 1 09/29/2019  . ondansetron (ZOFRAN) 4 MG tablet Take 1 tablet (4 mg total) by mouth every 8 (eight) hours as needed for nausea. 20 tablet 0 Past Week at Unknown time  . oxyCODONE-acetaminophen (PERCOCET) 10-325 MG tablet Take 1 tablet by mouth 5 (five) times daily as needed for pain.    09/29/2019  . pantoprazole (PROTONIX) 40 MG tablet Take 1 tablet (40 mg total) by mouth 2 (two) times daily. (Patient taking differently: Take 40 mg by mouth daily. ) 60 tablet 1 09/29/2019  . senna-docusate (SENOKOT-S) 8.6-50 MG tablet Take 1 tablet by mouth 2 (two) times daily as needed for moderate constipation.   Past Week at Unknown time  . varenicline (CHANTIX) 0.5 MG tablet Take 0.5 mg by mouth 2 (two) times daily.   09/29/2019    Assessment: 64 yo male R foot ischemia. He is on apixaban for afib (last dose ~ 9am on 6/22). Pharmacy to dose heparin for atheroembolism and possible procedures  Heparin resumed earlier today and aPTT this evening is therapeutic (aPTT 79, goal of 66-102). No bleeding or issues noted per RN.   Goal of Therapy:  Heparin level 0.3-0.7 units/ml aPTT 66-102 seconds Monitor platelets by anticoagulation protocol: Yes   Plan:  - Continue Heparin at 1500 units/hr (15 ml/hr) - Will continue to monitor for any signs/symptoms of bleeding and will follow up with heparin level in 8 hours to confirm -F/u plans for oral anticoagulation eventually (apixaban PTA).  Thank you for allowing pharmacy to be a part of this patient's care.  Alycia Rossetti, PharmD, BCPS Clinical Pharmacist Clinical phone for 10/07/2019: [phone] 10/07/2019 10:59 PM   **Pharmacist phone directory can now be found on [email or web address] (PW [email or web address]).  Listed under [place].

## 2019-10-07 NOTE — Addendum Note (Signed)
Addendum  created 10/07/19 0939 by Josephine Igo, CRNA   Order list changed

## 2019-10-07 NOTE — Progress Notes (Signed)
[facility] Progress Note   Assessment/ Plan:   OP HD:MWF [facility] 4h 400/600 RIJ TDC 90kg Hep none - epo 2400 tiw - Fe 50mg / wk  Assessment/ Plan: 1. Syncope/ resp arrest - no further episodes , on nasal O2.  Off pressors now and stable hemodynamically.    2. Refractory PD cath peritonitis - TNC 5314, 94% pmn's. WBC 30k--> 13.3 with treatment.  Vanc/ ceftaz 6/19--> changed to zosyn 6/22 evening.  There was some question if PD cath was exposed in wound, appreciate gen surg input,  Not seen on MD eval.  Initial plan to treat, allow midline wound to heal more before PD cath removal. Cultures from 6/19 growing enterococcus and Serratia, sensitivities reviewed, cultures 6/22 growing serratia and cell counts consistent with refractory PD peritonitis.  Discussed with ID--> would be optimal to remove PD cath soon in the setting of refractory PD cath peritonitis.  I don't see how he would be a good PD candidate after all this anyway--> but this will be a conversation he will need to have with his primary nephrologist.  I'll d/w gen surg today     3. PAD: s/p bypass grafting 6/24  4. ESRD - on HD using TDC for now. HD on schedule MWF.  Continue current plan- pt is hemodynamically stable after OR so will do IHD.  5. Hyperkalemia - resolved.   6. Abd wound - slow healing wound after SBO surgery x 2 during May hospital stay  7. COPD - per primary  8. Hypotension: on midodrine 10 TID with intra-dialysis 10 mg if needed.  9. Anemia ckd - Hb 8.1, s/p darbe on Monday 60ug (gets epo at op HD)  10. 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  Received pRBCs, FFP, plts during procedure.  Hemodynamically stable this AM.  OK for intermittent HD today.  Objective:   BP 127/64   Pulse 94   Temp 98.7 F (37.1 C) (Oral)   Resp 18   Ht 6\' 4"  (1.93 m)   Wt 93.2 kg    SpO2 99%   BMI 25.01 kg/m   Physical Exam: Gen: NAD, sitting in bed CVS: RRR Resp: clear Abd: distended, soft, nontender, midline wound dressed, PD cath RLQ dressed Ext: 2+ ankle edema, R great toe and R 5th toe blue and cool, some mottling on sole of foot too. ACCESS: PD cath, L IJ [facility]  Labs: BMET Recent Labs  Lab 10/01/19 7654 10/01/19 6503 10/02/19 0219 10/04/19 0222 10/05/19 0630 10/06/19 0222 10/06/19 1621 10/07/19 0117 10/07/19 0343  NA 134*   < > 136 135 131* 137 136 135 135  K 5.4*   < > 3.6 4.1 4.8 4.0 4.0 4.3 4.3  CL 98  --  100 99 97* 107  --  100 101  CO2 22  --  27 26 21* 25  --  23 24  GLUCOSE 84  --  103* 96 114* 108*  --  102* 98  BUN 38*  --  23 27* 37* 20  --  30* 30*  CREATININE 5.74*  --  3.27* 3.01* 3.88* 2.57*  --  3.32* 2.94*  CALCIUM 6.6*  --  7.1* 6.8* 6.8* 6.9*  --  6.5* 6.5*  PHOS 4.5  --  3.4 2.6 3.2 2.5  --   --  4.9*   < > =  values in this interval not displayed.   CBC Recent Labs  Lab 10/05/19 0630 10/05/19 0630 10/06/19 0222 10/06/19 1621 10/06/19 2347 10/07/19 0343  WBC 15.5*  --  10.2  --  9.7 9.6  HGB 8.0*   < > 7.9* 7.5* 8.5* 8.5*  HCT 26.0*   < > 25.3* 22.0* 26.6* 26.3*  MCV 95.6  --  95.1  --  91.4 91.0  PLT 215  --  230  --  228 237   < > = values in this interval not displayed.      Medications:    . sodium chloride   Intravenous Once  . sodium chloride   Intravenous Once  . atorvastatin  80 mg Oral Daily  . Chlorhexidine Gluconate Cloth  6 each Topical Q0600  . Chlorhexidine Gluconate Cloth  6 each Topical Q0600  . clopidogrel  75 mg Oral Daily  . diltiazem  180 mg Oral Daily  . docusate sodium  100 mg Oral Daily  . fentaNYL      . fentaNYL      . gabapentin  300 mg Oral QHS  . gentamicin cream  1 application Topical Daily  . midodrine  10 mg Oral TID WC  . predniSONE  40 mg Oral Q breakfast     Madelon Lips, MD 10/07/2019, 9:16 AM

## 2019-10-07 NOTE — Progress Notes (Signed)
OT Cancellation Note  Patient Details Name: Aaron [name] MRN: [id number] DOB: Sep 18, 1955   Cancelled Treatment:    Reason Eval/Treat Not Completed: Patient at procedure or test/ unavailable (HD) OT to check back as appropriate.  Billey Chang, OTR/L  Acute Rehabilitation Services Pager: [phone] Office: [phone] .  10/07/2019, 1:57 PM

## 2019-10-07 NOTE — Progress Notes (Signed)
[facility] for Infectious Disease  Date of Admission:  10/01/2019      Total days of antibiotics 6  Day 4 piperacillin-tazobactam            ASSESSMENT: MATE ALEGRIA is a 64 y.o. male with polymicrobial peritonitis due to serratia and enterococcus faecalis. Repeated peritoneal fluid indicates refractory serratia peritonitis despite appropriate treatment. Still some soreness in the abdomen.  Dr. Hollie Salk to discuss with general surgery today - would recommend removal at earliest ability.  Would recommend piperacillin tazobactam x 5 more days following removal of PD catheter.   He is hopefully planning to move out of ICU today. Dr. Baxter Flattery is available over the weekend for ID questions and can be reached at [phone].     PLAN: 1. Continue piperacillin-tazobactam  2. Follow for PD catheter removal time     Principal Problem:   Peritonitis (Micro) Active Problems:   Septic shock (HCC)   Shortness of breath   Atrial fibrillation (HCC)   S/P thoracentesis   Ischemic ulcer of toe of right foot ([place])   . sodium chloride   Intravenous Once  . sodium chloride   Intravenous Once  . atorvastatin  80 mg Oral Daily  . Chlorhexidine Gluconate Cloth  6 each Topical Q0600  . Chlorhexidine Gluconate Cloth  6 each Topical Q0600  . clopidogrel  75 mg Oral Daily  . diltiazem  180 mg Oral Daily  . docusate sodium  100 mg Oral Daily  . gabapentin  300 mg Oral QHS  . gentamicin cream  1 application Topical Daily  . midodrine  10 mg Oral TID WC  . predniSONE  40 mg Oral Q breakfast    SUBJECTIVE: Doing well - working with PT now. Family in the room.  Still having soreness in the abdomen.   Review of Systems: Review of Systems  Respiratory: Positive for cough. Negative for shortness of breath.   Gastrointestinal: Positive for abdominal pain.  All other systems reviewed and are negative.   No Known Allergies  OBJECTIVE: Vitals:   10/07/19 0530 10/07/19 0700  10/07/19 0858 10/07/19 1121  BP: 99/65  127/64   Pulse: 94     Resp: 18     Temp:  98.7 F (37.1 C)  98.2 F (36.8 C)  TempSrc:  Oral  Oral  SpO2: 99%     Weight:      Height:       Body mass index is 25.01 kg/m.  Physical Exam Vitals and nursing note reviewed.  Constitutional:      Appearance: He is not ill-appearing.     Comments: Sitting in chair performing LE physical therapy. Appears well today.   HENT:     Mouth/Throat:     Mouth: Mucous membranes are moist.     Pharynx: Oropharynx is clear.  Eyes:     General: No scleral icterus. Cardiovascular:     Rate and Rhythm: Normal rate and regular rhythm.     Heart sounds: No murmur heard.   Pulmonary:     Effort: Pulmonary effort is normal.     Breath sounds: Rhonchi present.  Abdominal:     General: There is distension.     Palpations: Abdomen is soft.     Tenderness: There is no abdominal tenderness.  Musculoskeletal:     Comments: Socks on with PT ongoing - did not assess feet today   Skin:    General: Skin is  warm and dry.     Comments: LE incisions from peripheral bypass are clean and open to air.   Neurological:     Mental Status: He is alert and oriented to person, place, and time.     Lab Results Lab Results  Component Value Date   WBC 9.6 10/07/2019   HGB 8.5 (L) 10/07/2019   HCT 26.3 (L) 10/07/2019   MCV 91.0 10/07/2019   PLT 237 10/07/2019    Lab Results  Component Value Date   CREATININE 2.94 (H) 10/07/2019   BUN 30 (H) 10/07/2019   NA 135 10/07/2019   K 4.3 10/07/2019   CL 101 10/07/2019   CO2 24 10/07/2019    Lab Results  Component Value Date   ALT 16 10/06/2019   AST 28 10/06/2019   ALKPHOS 216 (H) 10/06/2019   BILITOT 0.3 10/06/2019     Microbiology: Recent Results (from the past 240 hour(s))  MRSA PCR Screening     Status: None   Collection Time: 10/01/19  8:18 AM   Specimen: Nasal Mucosa; Nasopharyngeal  Result Value Ref Range Status   MRSA by PCR NEGATIVE NEGATIVE Final     Comment:        The GeneXpert MRSA Assay (FDA approved for NASAL specimens only), is one component of a comprehensive MRSA colonization surveillance program. It is not intended to diagnose MRSA infection nor to guide or monitor treatment for MRSA infections. Performed at [facility], [place] [address] [place], [place] [address]   Body fluid culture     Status: None   Collection Time: 10/01/19  1:20 PM   Specimen: Body Fluid  Result Value Ref Range Status   Specimen Description FLUID PERITONEAL DIALYSIS  Final   Special Requests Immunocompromised  Final   Gram Stain   Final    RARE WBC PRESENT, PREDOMINANTLY PMN NO ORGANISMS SEEN Performed at [facility], [address], [place], [place] [address]    Culture   Final    FEW ENTEROCOCCUS FAECALIS FEW SERRATIA MARCESCENS    Report Status 10/04/2019 FINAL  Final   Organism ID, Bacteria ENTEROCOCCUS FAECALIS  Final   Organism ID, Bacteria SERRATIA MARCESCENS  Final      Susceptibility   Enterococcus faecalis - MIC*    AMPICILLIN <=2 SENSITIVE Sensitive     VANCOMYCIN 1 SENSITIVE Sensitive     GENTAMICIN SYNERGY RESISTANT Resistant     * FEW ENTEROCOCCUS FAECALIS   Serratia marcescens - MIC*    CEFAZOLIN >=64 RESISTANT Resistant     CEFEPIME <=0.12 SENSITIVE Sensitive     CEFTAZIDIME <=1 SENSITIVE Sensitive     CEFTRIAXONE <=0.25 SENSITIVE Sensitive     CIPROFLOXACIN <=0.25 SENSITIVE Sensitive     GENTAMICIN <=1 SENSITIVE Sensitive     TRIMETH/SULFA <=20 SENSITIVE Sensitive     * FEW SERRATIA MARCESCENS  Body fluid culture (includes gram stain)     Status: None   Collection Time: 10/03/19  2:25 PM   Specimen: Pleural Fluid  Result Value Ref Range Status   Specimen Description FLUID PLEURAL LEFT  Final   Special Requests NONE  Final   Gram Stain   Final    WBC PRESENT,BOTH PMN AND MONONUCLEAR NO ORGANISMS SEEN CYTOSPIN SMEAR    Culture   Final    NO GROWTH 3 DAYS Performed at [facility],  [address] [place], [place] [address]    Report Status 10/06/2019 FINAL  Final  Body fluid  culture     Status: None (Preliminary result)   Collection Time: 10/04/19 10:30 AM   Specimen: Peritoneal Dialysate; Body Fluid  Result Value Ref Range Status   Specimen Description PERITONEAL DIALYSATE  Final   Special Requests NONE  Final   Gram Stain   Final    ABUNDANT WBC PRESENT,BOTH PMN AND MONONUCLEAR NO ORGANISMS SEEN Performed at [facility], [address], [place], [place] [address]    Culture   Final    MODERATE SERRATIA MARCESCENS FEW ENTEROCOCCUS FAECALIS    Report Status PENDING  Incomplete   Organism ID, Bacteria SERRATIA MARCESCENS  Final      Susceptibility   Serratia marcescens - MIC*    CEFAZOLIN >=64 RESISTANT Resistant     CEFEPIME 0.5 SENSITIVE Sensitive     CEFTAZIDIME <=1 SENSITIVE Sensitive     CEFTRIAXONE 0.5 SENSITIVE Sensitive     CIPROFLOXACIN <=0.25 SENSITIVE Sensitive     GENTAMICIN <=1 SENSITIVE Sensitive     TRIMETH/SULFA <=20 SENSITIVE Sensitive     * MODERATE SERRATIA MARCESCENS  Surgical pcr screen     Status: None   Collection Time: 10/05/19  9:53 PM   Specimen: Nasal Mucosa; Nasal Swab  Result Value Ref Range Status   MRSA, PCR NEGATIVE NEGATIVE Final   Staphylococcus aureus NEGATIVE NEGATIVE Final    Comment: (NOTE) The Xpert SA Assay (FDA approved for NASAL specimens in patients 10 years of age and older), is one component of a comprehensive surveillance program. It is not intended to diagnose infection nor to guide or monitor treatment. Performed at [facility], [place] [address], [place], [place] [address]      Janene Madeira, MSN, NP-C [facility] for Infectious Disease [email or web address] Pager: [phone] Office: [phone] [place]: [phone]

## 2019-10-07 NOTE — Progress Notes (Signed)
Physical Therapy Treatment Patient Details Name: Aaron [name] MRN: [id number] DOB: 06/03/1955 Today's Date: 10/07/2019    History of Present Illness Patient is a 64 y/o male admitted with syncope during HD with respiratory arrest. 6/24 pt with left femoral endarterectomy with iliac stent and left fem-pop BPG as well as ret femoral artery cannulation and stent.PMHx: pt with ESRD normally on peritoneal dialysist but has been on HD since SBO s/p ex lap 5/17 with repeat 5/24, discitis, HLD, CAD, PAD, A-fib with RVR, HTN, COPD, CHF.    PT Comments    Pt pleasant and reports left leg pain post op with willingness to perform bil LE HEP and even increasing reps requested of him. Pt with limited gait due to pain with HR up to 135 and unable to assess SpO2 given no pleth reading. Pt educated for progression and ROM post-op. Will continue to follow.   HR 113-135 with activity, HR 104 at rest 137/80 (99)    Follow Up Recommendations  Home health PT;Supervision for mobility/OOB     Equipment Recommendations  None recommended by PT    Recommendations for Other Services       Precautions / Restrictions Precautions Precautions: Fall Precaution Comments: abdominal incisiont, wound bil hallux    Mobility  Bed Mobility               General bed mobility comments: in chair on arrival  Transfers Overall transfer level: Needs assistance   Transfers: Sit to/from Stand Sit to Stand: Min assist         General transfer comment: min assist with increased time and cues for hand placement to stand from chair, assist to control descent  Ambulation/Gait Ambulation/Gait assistance: Min guard Gait Distance (Feet): 40 Feet Assistive device: Rolling walker (2 wheeled) Gait Pattern/deviations: Step-to pattern;Decreased stride length;Trunk flexed   Gait velocity interpretation: 1.31 - 2.62 ft/sec, indicative of limited community ambulator General Gait Details: cues for posture with HR up to  135 with gait on 2L O2 per pt request but unable to get SpO2 reading throughout   [employer] Mobility    Modified Rankin (Stroke Patients Only)       Balance Overall balance assessment: Mild deficits observed, not formally tested   Sitting balance-Leahy Scale: Good     Standing balance support: Bilateral upper extremity supported;During functional activity Standing balance-Leahy Scale: Poor Standing balance comment: RW support for gait                            Cognition Arousal/Alertness: Awake/alert Behavior During Therapy: WFL for tasks assessed/performed Overall Cognitive Status: Within Functional Limits for tasks assessed                                        Exercises General Exercises - Lower Extremity Long Arc Quad: AROM;Both;Seated;15 reps (x 2 sets) Hip Flexion/Marching: AROM;Both;Seated;15 reps (x 2 sets) Toe Raises: AROM;Both;Seated;20 reps    General Comments        Pertinent Vitals/Pain Pain Score: 6  Pain Location: left foot Pain Descriptors / Indicators: Aching;Guarding Pain Intervention(s): Limited activity within patient's tolerance;Monitored during session;Repositioned    Home Living                      Prior Function  PT Goals (current goals can now be found in the care plan section) Progress towards PT goals: Progressing toward goals    Frequency    Min 3X/week      PT Plan Current plan remains appropriate    Co-evaluation              AM-PAC PT "6 Clicks" Mobility   Outcome Measure  Help needed turning from your back to your side while in a flat bed without using bedrails?: None Help needed moving from lying on your back to sitting on the side of a flat bed without using bedrails?: None Help needed moving to and from a bed to a chair (including a wheelchair)?: A Little Help needed standing up from a chair using your arms (e.g., wheelchair or  bedside chair)?: A Little Help needed to walk in hospital room?: A Little Help needed climbing 3-5 steps with a railing? : A Little 6 Click Score: 20    End of Session Equipment Utilized During Treatment: Oxygen Activity Tolerance: Patient tolerated treatment well Patient left: in chair;with call bell/phone within reach;with family/visitor present;with chair alarm set Nurse Communication: Mobility status PT Visit Diagnosis: Other abnormalities of gait and mobility (R26.89);Muscle weakness (generalized) (M62.81);Difficulty in walking, not elsewhere classified (R26.2);Pain     Time: 6629-4765 PT Time Calculation (min) (ACUTE ONLY): 38 min  Charges:  $Gait Training: 8-22 mins $Therapeutic Exercise: 8-22 mins                     Audry Pecina P, PT Acute Rehabilitation Services Pager: [phone] Office: [place] 10/07/2019, 1:23 PM

## 2019-10-07 NOTE — Progress Notes (Addendum)
Progress Note    10/07/2019 7:57 AM 1 Day Post-Op  Subjective:  Sitting up in chair. Some soreness from left knee down into left foot. No other complaints   Vitals:   10/07/19 0530 10/07/19 0700  BP: 99/65   Pulse: 94   Resp: 18   Temp:  98.7 F (37.1 C)  SpO2: 99%    Physical Exam: Cardiac:  regular Lungs:  Non labored Incisions: left groin incision clean dry and intact. No hematoma or swelling. Dry Gauze reapplied. Left popliteal incision c/d/i Extremities: right common femoral access site clean, dry and intact. Soft without hematoma. Bilateral lower extremities warm. Motor and sensation intact. Palpable right DP. Brisk Doppler PT. Left Doppler  DP and PT signals. Abdomen:  Obese, soft Neurologic: alert and oriented   CBC    Component Value Date/Time   WBC 9.6 10/07/2019 0343   RBC 2.89 (L) 10/07/2019 0343   HGB 8.5 (L) 10/07/2019 0343   HCT 26.3 (L) 10/07/2019 0343   PLT 237 10/07/2019 0343   MCV 91.0 10/07/2019 0343   MCH 29.4 10/07/2019 0343   MCHC 32.3 10/07/2019 0343   RDW 17.2 (H) 10/07/2019 0343   LYMPHSABS 0.7 09/20/2019 1306   MONOABS 1.0 09/20/2019 1306   EOSABS 0.0 09/20/2019 1306   BASOSABS 0.0 09/20/2019 1306    BMET    Component Value Date/Time   NA 135 10/07/2019 0343   K 4.3 10/07/2019 0343   CL 101 10/07/2019 0343   CO2 24 10/07/2019 0343   GLUCOSE 98 10/07/2019 0343   BUN 30 (H) 10/07/2019 0343   CREATININE 2.94 (H) 10/07/2019 0343   CALCIUM 6.5 (L) 10/07/2019 0343   GFRNONAA 22 (L) 10/07/2019 0343   GFRAA 25 (L) 10/07/2019 0343    INR    Component Value Date/Time   INR 1.2 10/06/2019 2347     Intake/Output Summary (Last 24 hours) at 10/07/2019 0758 Last data filed at 10/07/2019 0600 Gross per 24 hour  Intake 2337.45 ml  Output 965 ml  Net 1372.45 ml     Assessment/Plan:  64 y.o. male is s/p 1.  Extensive left common femoral endarterectomy extending into the external iliac artery, proximal SFA and profunda femoris artery  with Dacron patch angioplasty 2.  Ultrasound-guided cannulation right common femoral artery 3.  Stent of left common iliac artery with 8 x 40 Innova and stent of left external iliac artery with 8 x 59 mm VBX dilated distally with 10 mm balloon 4.  Left lower extremity angiography 5.  Aortogram with right lower extremity angiography 6.  Stent of right SFA with 5 mm x 10 cm Viabahn 7.  Left common femoral to below-knee popliteal artery bypass with 6 mm ringed PTFE 8.  Attempted minx device closure right common femoral artery    1 Day Post-Op. Doing well post op. Sitting up in chair. Bilateral lower extremities well perfused and warm with palpable DP on right and Doppler DP/ PT signals left. Incisions all clean dry and intact. Remains hemodynamically stable following transfusion of PRBC, platelets and FFP yesterday. Heparin was held overnight. Will discuss resuming with Dr. Donzetta Matters. Renal function improving. Remains on [place] for peritonitis. HD per nephrology. PT to eval today. Medical management per primary team    Karoline Caldwell, [facility] [phone] 10/07/2019 7:58 AM   I have independently interviewed and examined patient and agree with PA assessment and plan above.  Okay to restart heparin this morning.  I have also ordered Plavix  given new stenting performed yesterday.  Will need meticulous wound care in the left groin.  We will follow left great toe for demarcation will possibly need amputation in the future.  Right toe remains purple and painful this is likely secondary to embolic disease the treatment of which would be anticoagulation at this point.  Maisyn Nouri C. Donzetta Matters, MD [facility] Office: [phone] Pager: [phone]

## 2019-10-08 ENCOUNTER — Other Ambulatory Visit: Payer: Self-pay

## 2019-10-08 DIAGNOSIS — L97519 Non-pressure chronic ulcer of other part of right foot with unspecified severity: Secondary | ICD-10-CM

## 2019-10-08 LAB — BASIC METABOLIC PANEL
Anion gap: 13 (ref 5–15)
BUN: 25 mg/dL — ABNORMAL HIGH (ref 8–23)
CO2: 23 mmol/L (ref 22–32)
Calcium: 7.2 mg/dL — ABNORMAL LOW (ref 8.9–10.3)
Chloride: 99 mmol/L (ref 98–111)
Creatinine, Ser: 2.8 mg/dL — ABNORMAL HIGH (ref 0.61–1.24)
GFR calc Af Amer: 27 mL/min — ABNORMAL LOW (ref >60)
GFR calc non Af Amer: 23 mL/min — ABNORMAL LOW (ref >60)
Glucose, Bld: 97 mg/dL (ref 70–99)
Potassium: 4.3 mmol/L (ref 3.5–5.1)
Sodium: 135 mmol/L (ref 135–145)

## 2019-10-08 LAB — CBC
HCT: 26.1 % — ABNORMAL LOW (ref 39.0–52.0)
Hemoglobin: 8.1 g/dL — ABNORMAL LOW (ref 13.0–17.0)
MCH: 29 pg (ref 26.0–34.0)
MCHC: 31 g/dL (ref 30.0–36.0)
MCV: 93.5 fL (ref 80.0–100.0)
Platelets: 259 10*3/uL (ref 150–400)
RBC: 2.79 MIL/uL — ABNORMAL LOW (ref 4.22–5.81)
RDW: 17.4 % — ABNORMAL HIGH (ref 11.5–15.5)
WBC: 13.3 10*3/uL — ABNORMAL HIGH (ref 4.0–10.5)
nRBC: 0.3 % — ABNORMAL HIGH (ref 0.0–0.2)

## 2019-10-08 LAB — BODY FLUID CULTURE

## 2019-10-08 LAB — GLUCOSE, CAPILLARY
Glucose-Capillary: 97 mg/dL (ref 70–99)
Glucose-Capillary: 110 mg/dL — ABNORMAL HIGH (ref 70–99)

## 2019-10-08 LAB — APTT
aPTT: 120 seconds — ABNORMAL HIGH (ref 24–36)
aPTT: 80 seconds — ABNORMAL HIGH (ref 24–36)

## 2019-10-08 MED ORDER — PROMETHAZINE HCL 25 MG/ML IJ SOLN
12.5000 mg | Freq: Four times a day (QID) | INTRAMUSCULAR | Status: DC | PRN
  Administered 2019-10-08 – 2019-10-12 (×4): 12.5 mg via INTRAVENOUS
  Filled 2019-10-08 (×4): qty 1

## 2019-10-08 NOTE — Progress Notes (Signed)
PROGRESS NOTE  Aaron [name] WPY:[id number] DOB: [birth year] DOA: 10/01/2019 PCP: Gwenlyn Saran, [place]   LOS: 7 days   Brief narrative: As per HPI,  64 y.o. Male with PMH of ESRD on peritoneal dialysis, history of  permanent Atrial fibrillation on Eliquis who was recently admitted for small bowel obstruction  status post exploratory laparatomy with retention sutures was at dialysis on 6/18 when he became dizzy and had syncopal episode and respiratory arrest.  He was then sent to [facility] emergency department with he was noted to have elevated lactate at 6.6.  He was then transferred to [facility] when en route developed atrial fibrillation with rapid ventricular response and was given Cardizem iv which dropped his blood pressure.  He was then started on Neo-Synephrine and was admitted to the ICU.  Patient was subsequently stable and was transferred out of the ICU.   Subjective: 10/06/2019 he underwent -left common femoral endarterectomy angioplasty SFA and profunda femoris artery Stent to left common iliac artery, left external iliac artery, left lower extremity angiography, aortogram, right lower extremity angiography, stent to right SFA, left common femoral below-knee popliteal artery bypass.  Today, patient was seen and examined at bedside.  He is complaining of abdominal pain, nausea.  Assessment/Plan:  Principal Problem:   Peritonitis ([place]) Active Problems:   Septic shock (HCC)   Shortness of breath   Atrial fibrillation (HCC)   S/P thoracentesis   Ischemic ulcer of toe of right foot (HCC)   Bilateral lower extremity ischemia/edema, pain/  Postop day #1 10/06/2019 status post -left common femoral endarterectomy angioplasty SFA and profunda femoris artery Stent to left common iliac artery, left external iliac artery, left lower extremity angiography, aortogram, right lower extremity angiography, stent to right SFA, left common femoral below-knee  popliteal artery bypass -Stable-reporting improved pain and swelling in the lower extremities -Positive pulses in the lower extremities bilaterally -Vascular team following, heparin DC'd    -History of iliofemoral endarterectomy. -underwent CT angiogram with left external iliac artery and common femoral artery occlusion and left lower extremity ischemia and left great toe ulceration.    Syncopal Episode with hypotension BP remained stable, no further episodes Possibly secondary to peritonitis, volume depletion.  Currently off pressors. On midodrine trial.  2D echocardiogram on 05/27/2019 was 50 to 55%.  Atrial Fibrillation/RVR Remained stable On presentation.  Seen by cardiology.  On Cardizem and Eliquis.  Beta-blockers on hold, could be restarted as needed.   Cardiology recommending outpatient follow-up with elective cardioversion after minimum of 3 weeks of anticoagulation if needed.  Follow-up recommended with Dr. Denman George, [facility].  Cardiology has signed off at this time.  Blood pressure has improved at this time.   Lactic acidosis, recent SBO s/p ex lap bowel resection , resolving. PD cath peritonitis, septic shock  Peritoneal fluid cultures showing Enterococcus and Proteus.    Continue Zosyn as per ID.  Improved leukocytosis, remains afebrile, normotensive. -Ideal to remove the PD cath in the setting of PD cath peritonitis. -Nephrology and infectious disease following.  ESRD -Due for hemodialysis today 10/07/2019 Was on peritoneal dialysis, now on HD through left internal jugular dialysis catheter M/W/F.   Continue hemodialysis as per nephrology.  Left-sided pleural effusion, dyspnea on exertion dyspnea. Remained stable,  Pulmonary has started on short course of prednisone.   Status post left-sided thoracocentesis on 10/03/2019 with removal of 1200 mL of straw-colored fluid.  Appears to be transudative at this time. Appears comfortable  Debility, deconditioning.  Physical therapy has been consulted.  Recommend Home health PT on discharge.  DVT prophylaxis: SCD's Start: 10/06/19 2256 SCDs Start: 10/01/19 0607 Code Status: Full code  Family Communication: None today.   Status is: Inpatient  Remains inpatient appropriate because: Unsafe d/c plan, IV treatments appropriate due to intensity of illness or inability to take PO, Inpatient level of care appropriate due to severity of illness, ongoing hemodialysis, vascular surgery intervention,    Dispo: The patient is from: Home              Anticipated d/c is to: Likely home with home health.  Vascular intervention likely today              Anticipated d/c date is: >3 days              Patient currently is not medically stable to d/c.  Consultants:  PCCM,   general surgery,   Nephrology  Vascular surgery  ID   Procedures:  Hemodialysis   Thoracocentesis left on 10/03/19  Antibiotics:  . Ceftazidime, vancomycin iv 6/20>6/22 . Zosyn 6/22>   Objective: Vitals:   10/08/19 0334 10/08/19 0748  BP: (!) 127/95 130/85  Pulse: 96 92  Resp: 18 16  Temp: 98.1 F (36.7 C) 98.5 F (36.9 C)  SpO2: 98% 99%    Intake/Output Summary (Last 24 hours) at 10/08/2019 1022 Last data filed at 10/08/2019 0300 Gross per 24 hour  Intake 580.35 ml  Output 2600 ml  Net -2019.65 ml   Filed Weights   10/07/19 1336 10/07/19 2000 10/08/19 0349  Weight: 94.3 kg 91.7 kg 91.9 kg   Body mass index is 24.66 kg/m.    Physical Exam  Constitution: Thin appearing male, alert, cooperative, no distress,  Psychiatric: Normal and stable mood and affect, cognition intact,   HEENT: Normocephalic, PERRL, otherwise with in Normal limits  Chest:Chest symmetric Cardio vascular:  S1/S2, RRR pulmonary: Clear to auscultation bilaterally, respirations unlabored, negative wheezes / crackles Abdomen: Soft, distended, abdominal wound with dressing in place, PD cath right lower quadrant with dressing Muscular  skeletal: Limited exam - in bed, able to move all 4 extremities, Normal strength,  Neuro: CNII-XII intact. , normal motor and sensation, reflexes intact  Extremities: +3 L>R  pitting edema lower extremities,  right great toe, right fifth toe with some discoloration Skin: Dry, warm to touch, no rashes    Data Review: I have personally reviewed the following laboratory data and studies,  CBC: Recent Labs  Lab 10/05/19 0630 10/05/19 0630 10/06/19 0222 10/06/19 0222 10/06/19 1621 10/06/19 1932 10/06/19 2347 10/07/19 0343 10/08/19 0759  WBC 15.5*  --  10.2  --   --   --  9.7 9.6 13.3*  HGB 8.0*   < > 7.9*   < > 7.5* 8.2* 8.5* 8.5* 8.1*  HCT 26.0*   < > 25.3*   < > 22.0* 24.0* 26.6* 26.3* 26.1*  MCV 95.6  --  95.1  --   --   --  91.4 91.0 93.5  PLT 215  --  230  --   --   --  228 237 259   < > = values in this interval not displayed.   Basic Metabolic Panel: Recent Labs  Lab 10/02/19 0219 10/02/19 0219 10/04/19 0222 10/04/19 0222 10/05/19 0630 10/05/19 0630 10/06/19 0222 10/06/19 0222 10/06/19 1621 10/06/19 1932 10/07/19 0117 10/07/19 0343 10/08/19 0759  NA 136   < > 135   < > 131*   < >  137   < > 136 136 135 135 135  K 3.6   < > 4.1   < > 4.8   < > 4.0   < > 4.0 4.7 4.3 4.3 4.3  CL 100   < > 99   < > 97*  --  107  --   --   --  100 101 99  CO2 27   < > 26   < > 21*  --  25  --   --   --  23 24 23   GLUCOSE 103*   < > 96   < > 114*  --  108*  --   --   --  102* 98 97  BUN 23   < > 27*   < > 37*  --  20  --   --   --  30* 30* 25*  CREATININE 3.27*   < > 3.01*   < > 3.88*  --  2.57*  --   --   --  3.32* 2.94* 2.80*  CALCIUM 7.1*   < > 6.8*   < > 6.8*  --  6.9*  --   --   --  6.5* 6.5* 7.2*  MG 1.6*  --  1.4*  --  2.2  --  1.9  --   --   --   --  1.9  --   PHOS 3.4  --  2.6  --  3.2  --  2.5  --   --   --   --  4.9*  --    < > = values in this interval not displayed.   Liver Function Tests: Recent Labs  Lab 10/04/19 0222 10/05/19 0630 10/06/19 0222  AST 34 27 28   ALT 16 16 16   ALKPHOS 272* 242* 216*  BILITOT 0.6 0.5 0.3  PROT 5.1* 5.5* 5.4*  ALBUMIN 1.5* 1.5* 1.5*   No results for input(s): LIPASE, AMYLASE in the last 168 hours. No results for input(s): AMMONIA in the last 168 hours. Cardiac Enzymes: No results for input(s): CKTOTAL, CKMB, CKMBINDEX, TROPONINI in the last 168 hours. BNP (last 3 results) Recent Labs    05/26/19 1542 08/22/19 1930 10/01/19 0643  BNP 322.8* 1,578.0* 3,857.9*    ProBNP (last 3 results) No results for input(s): PROBNP in the last 8760 hours.  CBG: Recent Labs  Lab 10/04/19 2020 10/06/19 2258 10/07/19 0732 10/07/19 1119 10/08/19 0749  GLUCAP 126* 101* 118* 134* 97   Recent Results (from the past 240 hour(s))  MRSA PCR Screening     Status: None   Collection Time: 10/01/19  8:18 AM   Specimen: Nasal Mucosa; Nasopharyngeal  Result Value Ref Range Status   MRSA by PCR NEGATIVE NEGATIVE Final    Comment:        The GeneXpert MRSA Assay (FDA approved for NASAL specimens only), is one component of a comprehensive MRSA colonization surveillance program. It is not intended to diagnose MRSA infection nor to guide or monitor treatment for MRSA infections. Performed at [facility], [place] [address], [place], [place] [address]   Body fluid culture     Status: None   Collection Time: 10/01/19  1:20 PM   Specimen: Body Fluid  Result Value Ref Range Status   Specimen Description FLUID PERITONEAL DIALYSIS  Final   Special Requests Immunocompromised  Final   Gram Stain   Final    RARE WBC PRESENT, PREDOMINANTLY PMN NO ORGANISMS SEEN  Performed at [facility], [place] [address] [place], [place] [address]    Culture   Final    FEW ENTEROCOCCUS FAECALIS FEW SERRATIA MARCESCENS    Report Status 10/04/2019 FINAL  Final   Organism ID, Bacteria ENTEROCOCCUS FAECALIS  Final   Organism ID, Bacteria SERRATIA MARCESCENS  Final      Susceptibility   Enterococcus faecalis - MIC*    AMPICILLIN <=2  SENSITIVE Sensitive     VANCOMYCIN 1 SENSITIVE Sensitive     GENTAMICIN SYNERGY RESISTANT Resistant     * FEW ENTEROCOCCUS FAECALIS   Serratia marcescens - MIC*    CEFAZOLIN >=64 RESISTANT Resistant     CEFEPIME <=0.12 SENSITIVE Sensitive     CEFTAZIDIME <=1 SENSITIVE Sensitive     CEFTRIAXONE <=0.25 SENSITIVE Sensitive     CIPROFLOXACIN <=0.25 SENSITIVE Sensitive     GENTAMICIN <=1 SENSITIVE Sensitive     TRIMETH/SULFA <=20 SENSITIVE Sensitive     * FEW SERRATIA MARCESCENS  Body fluid culture (includes gram stain)     Status: None   Collection Time: 10/03/19  2:25 PM   Specimen: Pleural Fluid  Result Value Ref Range Status   Specimen Description FLUID PLEURAL LEFT  Final   Special Requests NONE  Final   Gram Stain   Final    WBC PRESENT,BOTH PMN AND MONONUCLEAR NO ORGANISMS SEEN CYTOSPIN SMEAR    Culture   Final    NO GROWTH 3 DAYS Performed at [facility], [address] [place], [place] [address]    Report Status 10/06/2019 FINAL  Final  Body fluid culture     Status: None (Preliminary result)   Collection Time: 10/04/19 10:30 AM   Specimen: Peritoneal Dialysate; Body Fluid  Result Value Ref Range Status   Specimen Description PERITONEAL DIALYSATE  Final   Special Requests NONE  Final   Gram Stain   Final    ABUNDANT WBC PRESENT,BOTH PMN AND MONONUCLEAR NO ORGANISMS SEEN Performed at [facility], [address] [place], [place] [address]    Culture   Final    MODERATE SERRATIA MARCESCENS FEW ENTEROCOCCUS FAECALIS    Report Status PENDING  Incomplete   Organism ID, Bacteria SERRATIA MARCESCENS  Final      Susceptibility   Serratia marcescens - MIC*    CEFAZOLIN >=64 RESISTANT Resistant     CEFEPIME 0.5 SENSITIVE Sensitive     CEFTAZIDIME <=1 SENSITIVE Sensitive     CEFTRIAXONE 0.5 SENSITIVE Sensitive     CIPROFLOXACIN <=0.25 SENSITIVE Sensitive     GENTAMICIN <=1 SENSITIVE Sensitive     TRIMETH/SULFA <=20 SENSITIVE Sensitive     * MODERATE  SERRATIA MARCESCENS  Surgical pcr screen     Status: None   Collection Time: 10/05/19  9:53 PM   Specimen: Nasal Mucosa; Nasal Swab  Result Value Ref Range Status   MRSA, PCR NEGATIVE NEGATIVE Final   Staphylococcus aureus NEGATIVE NEGATIVE Final    Comment: (NOTE) The Xpert SA Assay (FDA approved for NASAL specimens in patients 27 years of age and older), is one component of a comprehensive surveillance program. It is not intended to diagnose infection nor to guide or monitor treatment. Performed at [facility], [place] [address] [place], [place] [address]      Studies: VAS [place] LOWER EXTREMITY SAPHENOUS VEIN MAPPING  Result Date: 10/05/2019 LOWER EXTREMITY VEIN MAPPING Indications:       pre op Other Indications: PAD Risk Factors:  Hypertension, hyperlipidemia, coronary artery disease.  Comparison Study: no prior Performing Technologist: Abram Sander RVS  Examination Guidelines: A complete evaluation includes B-mode imaging, spectral Doppler, color Doppler, and power Doppler as needed of all accessible portions of each vessel. Bilateral testing is considered an integral part of a complete examination. Limited examinations for reoccurring indications may be performed as noted. Diagnosing physician: Curt Jews MD Electronically signed by Curt Jews MD on 10/05/2019 at 3:42:57 PM.    Final    HYBRID OR IMAGING ([place])  Result Date: 10/06/2019 There is no interpretation for this exam.  This order is for images obtained during a surgical procedure.  Please See "Surgeries" Tab for more information regarding the procedure.    Yaakov Guthrie, MD  [facility] Pager on [facility] 10/08/2019

## 2019-10-08 NOTE — Progress Notes (Signed)
ANTICOAGULATION CONSULT NOTE  Pharmacy Consult for heparin  Indication: atheroembolism  No Known Allergies  Patient Measurements: Height: 6\' 4"  (193 cm) Weight: 91.9 kg (202 lb 9.6 oz) IBW/kg (Calculated) : 86.8  Vital Signs: Temp: 98.5 F (36.9 C) (06/26 0748) Temp Source: Oral (06/26 0748) BP: 130/85 (06/26 0748) Pulse Rate: 92 (06/26 0748)  Labs: Recent Labs    10/05/19 1901 10/05/19 1901 10/06/19 0222 10/06/19 0222 10/06/19 1621 10/06/19 2347 10/06/19 2347 10/07/19 0117 10/07/19 0343 10/07/19 2150 10/08/19 0759  HGB  --   --  7.9*  --    < > 8.5*   < >  --  8.5*  --  8.1*  HCT  --   --  25.3*  --    < > 26.6*  --   --  26.3*  --  26.1*  PLT  --    < > 230  --   --  228  --   --  237  --  259  APTT 59*   < > 82*   < >  --   --   --   --  31 79* 120*  LABPROT  --   --   --   --   --  14.8  --   --   --   --   --   INR  --   --   --   --   --  1.2  --   --   --   --   --   HEPARINUNFRC >2.20*  --  >2.20*  --   --   --   --   --   --   --   --   CREATININE  --   --  2.57*   < >  --   --   --  3.32* 2.94*  --  2.80*   < > = values in this interval not displayed.    Estimated Creatinine Clearance: 33.2 mL/min (A) (by C-G formula based on SCr of 2.8 mg/dL (H)).   Medical History: Past Medical History:  Diagnosis Date  . Anxiety   . Arthritis    knees , back , shoulders (10/12/2017)  . Atrial fibrillation ([place])   . CAD (coronary artery disease)    Mild nonobstructive disease at cardiac catheterization 2002  . Chronic back pain    "back of my neck; down thru my legs" (10/12/2017)  . COPD (chronic obstructive pulmonary disease) ([place])   . Diverticulitis   . Esophageal reflux   . ESRD (end stage renal disease) on dialysis [facility])    "TTS; [place]" (11/23/2017)  . Essential hypertension   . Hepatitis C    states he no longer has this  . History of kidney stones   . History of syncope   . Hyperlipidemia   . Jerking 09/23/2014  . Myocardial infarction ([place]) 02/2017    "light one" (10/12/2017)  . Non-compliant behavior    non compliant with diaylsis per daughter  . PAT (paroxysmal atrial tachycardia) ([place])   . Peripheral arterial disease ([place])    Occluded left superficial femoral artery status post stent June 2016 - Dr. Trula Slade  . Pneumonia 1961  . Polycystic kidney, unspecified type   . Syncope 09/2014  . SYNCOPE 05/07/2010   Qualifier: Diagnosis of  By: Laurance Flatten, RN, BSN, Anderson Malta      Medications:  Medications Prior to Admission  Medication Sig Dispense Refill Last Dose  . albuterol (PROVENTIL HFA;VENTOLIN  HFA) 108 (90 Base) MCG/ACT inhaler Inhale 2 puffs into the lungs every 6 (six) hours as needed for wheezing or shortness of breath.   Past Week at Unknown time  . albuterol (PROVENTIL) (2.5 MG/3ML) 0.083% nebulizer solution Inhale 3 mLs into the lungs 3 (three) times daily as needed for shortness of breath or wheezing.   09/30/2019 at Unknown time  . apixaban (ELIQUIS) 5 MG TABS tablet Take 1 tablet (5 mg total) by mouth 2 (two) times daily. 60 tablet 1 09/29/2019 at 2100  . atorvastatin (LIPITOR) 80 MG tablet Take 80 mg by mouth daily.   09/29/2019 at Unknown time  . BREO ELLIPTA 200-25 MCG/INH AEPB Inhale 1 puff into the lungs daily as needed (for respiratory issues.).    Past Week at Unknown time  . calcium acetate (PHOSLO) 667 MG capsule Take 667 mg by mouth 3 (three) times daily with meals.   09/29/2019  . Darbepoetin Alfa (ARANESP) 60 MCG/0.3ML SOSY injection Inject 0.3 mLs (60 mcg total) into the vein every Wednesday with hemodialysis. 4.2 mL  09/28/2019  . diltiazem (CARDIZEM CD) 180 MG 24 hr capsule Take 1 capsule (180 mg total) by mouth daily. 90 capsule 1 09/29/2019  . docusate sodium (COLACE) 100 MG capsule Take 1 capsule (100 mg total) by mouth daily. 10 capsule 0 09/29/2019  . gabapentin (NEURONTIN) 300 MG capsule Take 1 capsule (300 mg total) by mouth at bedtime. 90 capsule 1 09/29/2019  . metoprolol tartrate (LOPRESSOR) 25 MG tablet Take 1 tablet  (25 mg total) by mouth 2 (two) times daily. 180 tablet 1 09/29/2019 at 2100  . multivitamin (RENA-VIT) TABS tablet Take 1 tablet by mouth at bedtime. 90 tablet 1 09/29/2019  . ondansetron (ZOFRAN) 4 MG tablet Take 1 tablet (4 mg total) by mouth every 8 (eight) hours as needed for nausea. 20 tablet 0 Past Week at Unknown time  . oxyCODONE-acetaminophen (PERCOCET) 10-325 MG tablet Take 1 tablet by mouth 5 (five) times daily as needed for pain.    09/29/2019  . pantoprazole (PROTONIX) 40 MG tablet Take 1 tablet (40 mg total) by mouth 2 (two) times daily. (Patient taking differently: Take 40 mg by mouth daily. ) 60 tablet 1 09/29/2019  . senna-docusate (SENOKOT-S) 8.6-50 MG tablet Take 1 tablet by mouth 2 (two) times daily as needed for moderate constipation.   Past Week at Unknown time  . varenicline (CHANTIX) 0.5 MG tablet Take 0.5 mg by mouth 2 (two) times daily.   09/29/2019    Assessment: 64 yo male R foot ischemia. He is on apixaban for afib (last dose ~ 9am on 6/22). Pharmacy to dose heparin for atheroembolism and possible procedures -aPTT= 120 and above goal, CBC stable  Goal of Therapy:  Heparin level 0.3-0.7 units/ml aPTT 66-102 seconds Monitor platelets by anticoagulation protocol: Yes   Plan:  -Decrease heparin to 1350 units/hr -aPTT in 8hrs and daily wth CBC daily -F/u plans for oral anticoagulation (apixaban PTA).  Thank you for allowing pharmacy to be a part of this patient's care.  Hildred Laser, PharmD Clinical Pharmacist **Pharmacist phone directory can now be found on [email or web address] ([email or web address]).  Listed under [place].

## 2019-10-08 NOTE — Progress Notes (Signed)
[facility] Progress Note   Assessment/ Plan:   OP HD:MWF [facility] 4h 400/600 RIJ TDC 90kg Hep none - epo 2400 tiw - Fe 50mg / wk  Assessment/ Plan: 1. Syncope/ resp arrest - no further episodes , on nasal O2.  Off pressors now and stable hemodynamically.    2. Refractory PD cath peritonitis - TNC 5314, 94% pmn's. WBC 30k--> 13.3 with treatment.  Vanc/ ceftaz 6/19--> changed to zosyn 6/22 evening.  There was some question if PD cath was exposed in wound, appreciate gen surg input,  Not seen on MD eval.  Initial plan to treat, allow midline wound to heal more before PD cath removal. Cultures from 6/19 growing enterococcus and Serratia, sensitivities reviewed, cultures 6/22 growing serratia and cell counts consistent with refractory PD peritonitis.  Discussed with ID--> would be optimal to remove PD cath soon in the setting of refractory PD cath peritonitis.  I don't see how he would be a good PD candidate after all this anyway--> but this will be a conversation he will need to have with his primary nephrologist.  Discussing with gen surg, greatly appreciate their assistance.     3. PAD: s/p bypass grafting 6/24  4. ESRD - on HD using TDC for now. HD on schedule MWF.  Continue current plan.  5. Hyperkalemia - resolved.   6. Abd wound - slow healing wound after SBO surgery x 2 during May hospital stay  7. COPD - per primary  8. Hypotension: on midodrine 10 TID with intra-dialysis 10 mg if needed.  9. 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  No complaints today.  Objective:   BP 130/85 (BP Location: Right Arm)   Pulse 92   Temp 98.5 F (36.9 C) (Oral)   Resp 16   Ht 6\' 4"  (1.93 m)   Wt 91.9 kg   SpO2 99%   BMI 24.66 kg/m   Physical Exam: Gen: NAD, sitting in bed CVS: RRR Resp: clear Abd:  distended, soft, nontender, midline wound dressed, PD cath RLQ dressed Ext: 2+ ankle edema, R great toe and R 5th toe blue and cool, some mottling on sole of foot too. ACCESS: PD cath, L IJ [facility]  Labs: BMET Recent Labs  Lab 10/02/19 0219 10/02/19 0219 10/04/19 0222 10/04/19 0222 10/05/19 0630 10/06/19 0222 10/06/19 1621 10/06/19 1932 10/07/19 0117 10/07/19 0343 10/08/19 0759  NA 136   < > 135   < > 131* 137 136 136 135 135 135  K 3.6   < > 4.1   < > 4.8 4.0 4.0 4.7 4.3 4.3 4.3  CL 100  --  99  --  97* 107  --   --  100 101 99  CO2 27  --  26  --  21* 25  --   --  23 24 23   GLUCOSE 103*  --  96  --  114* 108*  --   --  102* 98 97  BUN 23  --  27*  --  37* 20  --   --  30* 30* 25*  CREATININE 3.27*  --  3.01*  --  3.88* 2.57*  --   --  3.32* 2.94* 2.80*  CALCIUM 7.1*  --  6.8*  --  6.8* 6.9*  --   --  6.5* 6.5* 7.2*  PHOS 3.4  --  2.6  --  3.2 2.5  --   --   --  4.9*  --    < > = values in this interval not displayed.   CBC Recent Labs  Lab 10/06/19 0222 10/06/19 1621 10/06/19 1932 10/06/19 2347 10/07/19 0343 10/08/19 0759  WBC 10.2  --   --  9.7 9.6 13.3*  HGB 7.9*   < > 8.2* 8.5* 8.5* 8.1*  HCT 25.3*   < > 24.0* 26.6* 26.3* 26.1*  MCV 95.1  --   --  91.4 91.0 93.5  PLT 230  --   --  228 237 259   < > = values in this interval not displayed.      Medications:    . atorvastatin  80 mg Oral Daily  . Chlorhexidine Gluconate Cloth  6 each Topical Q0600  . Chlorhexidine Gluconate Cloth  6 each Topical Q0600  . clopidogrel  75 mg Oral Daily  . diltiazem  180 mg Oral Daily  . docusate sodium  100 mg Oral Daily  . gabapentin  300 mg Oral QHS  . gentamicin cream  1 application Topical Daily  . midodrine  10 mg Oral TID WC     Madelon Lips, MD 10/08/2019, 10:29 AM

## 2019-10-08 NOTE — Progress Notes (Signed)
ANTICOAGULATION CONSULT NOTE  Pharmacy Consult for heparin  Indication: atheroembolism  No Known Allergies  Patient Measurements: Height: 6\' 4"  (193 cm) Weight: 91.9 kg (202 lb 9.6 oz) IBW/kg (Calculated) : 86.8  Vital Signs: Temp: 97.8 F (36.6 C) (06/26 1638) Temp Source: Oral (06/26 1638) BP: 136/80 (06/26 1638) Pulse Rate: 68 (06/26 1638)  Labs: Recent Labs    10/05/19 1901 10/05/19 1901 10/06/19 0222 10/06/19 0222 10/06/19 1621 10/06/19 2347 10/06/19 2347 10/07/19 0117 10/07/19 0343 10/07/19 0343 10/07/19 2150 10/08/19 0759 10/08/19 1723  HGB  --   --  7.9*  --    < > 8.5*   < >  --  8.5*  --   --  8.1*  --   HCT  --   --  25.3*  --    < > 26.6*  --   --  26.3*  --   --  26.1*  --   PLT  --    < > 230  --   --  228  --   --  237  --   --  259  --   APTT 59*   < > 82*   < >  --   --   --   --  31   < > 79* 120* 80*  LABPROT  --   --   --   --   --  14.8  --   --   --   --   --   --   --   INR  --   --   --   --   --  1.2  --   --   --   --   --   --   --   HEPARINUNFRC >2.20*  --  >2.20*  --   --   --   --   --   --   --   --   --   --   CREATININE  --   --  2.57*   < >  --   --   --  3.32* 2.94*  --   --  2.80*  --    < > = values in this interval not displayed.    Estimated Creatinine Clearance: 33.2 mL/min (A) (by C-G formula based on SCr of 2.8 mg/dL (H)).   Medical History: Past Medical History:  Diagnosis Date  . Anxiety   . Arthritis    knees , back , shoulders (10/12/2017)  . Atrial fibrillation ([place])   . CAD (coronary artery disease)    Mild nonobstructive disease at cardiac catheterization 2002  . Chronic back pain    "back of my neck; down thru my legs" (10/12/2017)  . COPD (chronic obstructive pulmonary disease) ([place])   . Diverticulitis   . Esophageal reflux   . ESRD (end stage renal disease) on dialysis [facility])    "TTS; [facility]" (11/23/2017)  . Essential hypertension   . Hepatitis C    states he no longer has this  . History of kidney stones    . History of syncope   . Hyperlipidemia   . Jerking 09/23/2014  . Myocardial infarction ([place]) 02/2017   "light one" (10/12/2017)  . Non-compliant behavior    non compliant with diaylsis per daughter  . PAT (paroxysmal atrial tachycardia) ([place])   . Peripheral arterial disease (H[facility])    Occluded left superficial femoral artery status post stent June 2016 - Dr. Trula Slade  .  Pneumonia 1961  . Polycystic kidney, unspecified type   . Syncope 09/2014  . SYNCOPE 05/07/2010   Qualifier: Diagnosis of  By: Laurance Flatten, RN, BSN, Anderson Malta      Medications:  Medications Prior to Admission  Medication Sig Dispense Refill Last Dose  . albuterol (PROVENTIL HFA;VENTOLIN HFA) 108 (90 Base) MCG/ACT inhaler Inhale 2 puffs into the lungs every 6 (six) hours as needed for wheezing or shortness of breath.   Past Week at Unknown time  . albuterol (PROVENTIL) (2.5 MG/3ML) 0.083% nebulizer solution Inhale 3 mLs into the lungs 3 (three) times daily as needed for shortness of breath or wheezing.   09/30/2019 at Unknown time  . apixaban (ELIQUIS) 5 MG TABS tablet Take 1 tablet (5 mg total) by mouth 2 (two) times daily. 60 tablet 1 09/29/2019 at 2100  . atorvastatin (LIPITOR) 80 MG tablet Take 80 mg by mouth daily.   09/29/2019 at Unknown time  . BREO ELLIPTA 200-25 MCG/INH AEPB Inhale 1 puff into the lungs daily as needed (for respiratory issues.).    Past Week at Unknown time  . calcium acetate (PHOSLO) 667 MG capsule Take 667 mg by mouth 3 (three) times daily with meals.   09/29/2019  . Darbepoetin Alfa (ARANESP) 60 MCG/0.3ML SOSY injection Inject 0.3 mLs (60 mcg total) into the vein every Wednesday with hemodialysis. 4.2 mL  09/28/2019  . diltiazem (CARDIZEM CD) 180 MG 24 hr capsule Take 1 capsule (180 mg total) by mouth daily. 90 capsule 1 09/29/2019  . docusate sodium (COLACE) 100 MG capsule Take 1 capsule (100 mg total) by mouth daily. 10 capsule 0 09/29/2019  . gabapentin (NEURONTIN) 300 MG capsule Take 1 capsule (300 mg total)  by mouth at bedtime. 90 capsule 1 09/29/2019  . metoprolol tartrate (LOPRESSOR) 25 MG tablet Take 1 tablet (25 mg total) by mouth 2 (two) times daily. 180 tablet 1 09/29/2019 at 2100  . multivitamin (RENA-VIT) TABS tablet Take 1 tablet by mouth at bedtime. 90 tablet 1 09/29/2019  . ondansetron (ZOFRAN) 4 MG tablet Take 1 tablet (4 mg total) by mouth every 8 (eight) hours as needed for nausea. 20 tablet 0 Past Week at Unknown time  . oxyCODONE-acetaminophen (PERCOCET) 10-325 MG tablet Take 1 tablet by mouth 5 (five) times daily as needed for pain.    09/29/2019  . pantoprazole (PROTONIX) 40 MG tablet Take 1 tablet (40 mg total) by mouth 2 (two) times daily. (Patient taking differently: Take 40 mg by mouth daily. ) 60 tablet 1 09/29/2019  . senna-docusate (SENOKOT-S) 8.6-50 MG tablet Take 1 tablet by mouth 2 (two) times daily as needed for moderate constipation.   Past Week at Unknown time  . varenicline (CHANTIX) 0.5 MG tablet Take 0.5 mg by mouth 2 (two) times daily.   09/29/2019    Assessment: 65 yo male R foot ischemia. He is on apixaban for afib (last dose ~ 9am on 6/22). Pharmacy to dose heparin for atheroembolism and possible procedures -aPTT= 80sec at goal  on heparin drip 1350 uts/hr  CBC stable  Goal of Therapy:  Heparin level 0.3-0.7 units/ml aPTT 66-102 seconds Monitor platelets by anticoagulation protocol: Yes   Plan:  -Continue  heparin  1350 units/hr -aPTT and  CBC daily -F/u plans for oral anticoagulation (apixaban PTA).   Bonnita Nasuti Pharm.D. CPP, BCPS Clinical Pharmacist [phone] 10/08/2019 6:32 PM    **Pharmacist phone directory can now be found on [email or web address] ([email or web address]).  Listed under [place].

## 2019-10-09 LAB — CBC
HCT: 26.6 % — ABNORMAL LOW (ref 39.0–52.0)
Hemoglobin: 8.3 g/dL — ABNORMAL LOW (ref 13.0–17.0)
MCH: 29.1 pg (ref 26.0–34.0)
MCHC: 31.2 g/dL (ref 30.0–36.0)
MCV: 93.3 fL (ref 80.0–100.0)
Platelets: 317 10*3/uL (ref 150–400)
RBC: 2.85 MIL/uL — ABNORMAL LOW (ref 4.22–5.81)
RDW: 17.6 % — ABNORMAL HIGH (ref 11.5–15.5)
WBC: 15.5 10*3/uL — ABNORMAL HIGH (ref 4.0–10.5)
nRBC: 0.3 % — ABNORMAL HIGH (ref 0.0–0.2)

## 2019-10-09 LAB — BASIC METABOLIC PANEL
Anion gap: 13 (ref 5–15)
BUN: 34 mg/dL — ABNORMAL HIGH (ref 8–23)
CO2: 22 mmol/L (ref 22–32)
Calcium: 6.8 mg/dL — ABNORMAL LOW (ref 8.9–10.3)
Chloride: 100 mmol/L (ref 98–111)
Creatinine, Ser: 3.67 mg/dL — ABNORMAL HIGH (ref 0.61–1.24)
GFR calc Af Amer: 19 mL/min — ABNORMAL LOW (ref >60)
GFR calc non Af Amer: 17 mL/min — ABNORMAL LOW (ref >60)
Glucose, Bld: 109 mg/dL — ABNORMAL HIGH (ref 70–99)
Potassium: 4.1 mmol/L (ref 3.5–5.1)
Sodium: 135 mmol/L (ref 135–145)

## 2019-10-09 LAB — APTT: aPTT: 82 seconds — ABNORMAL HIGH (ref 24–36)

## 2019-10-09 MED ORDER — APIXABAN 5 MG PO TABS
5.0000 mg | ORAL_TABLET | Freq: Two times a day (BID) | ORAL | Status: DC
  Administered 2019-10-09 – 2019-10-13 (×9): 5 mg via ORAL
  Filled 2019-10-09 (×9): qty 1

## 2019-10-09 NOTE — Progress Notes (Signed)
[facility] Progress Note   Assessment/ Plan:   OP HD:MWF [facility] 4h 400/600 RIJ TDC 90kg Hep none - epo 2400 tiw - Fe 50mg / wk  Assessment/ Plan: 1. Syncope/ resp arrest - no further episodes , on nasal O2.  Off pressors now and stable hemodynamically.    2. Refractory PD cath peritonitis - TNC 5314, 94% pmn's. WBC 30k--> 13.3 with treatment.  Vanc/ ceftaz 6/19--> changed to zosyn 6/22 evening.  There was some question if PD cath was exposed in wound, appreciate gen surg input,  Not seen on MD eval.  Initial plan to treat, allow midline wound to heal more before PD cath removal. Cultures from 6/19 growing enterococcus and Serratia, sensitivities reviewed, cultures 6/22 growing serratia and cell counts consistent with refractory PD peritonitis.  Discussed with ID--> would be optimal to remove PD cath soon in the setting of refractory PD cath peritonitis.  I don't see how he would be a good PD candidate after all this anyway--> but this will be a conversation he will need to have with his primary nephrologist.  Discussing with gen surg, greatly appreciate their assistance, will need to circle back Monday.     3. PAD: s/p bypass grafting 6/24, transitioning to Eliquis from hep gtt per [facility]  4. ESRD - on HD using TDC for now. HD on schedule MWF.  Continue current plan.  5. Hyperkalemia - resolved.   6. Abd wound - slow healing wound after SBO surgery x 2 during May hospital stay  7. COPD - per primary  8. Hypotension: on midodrine 10 TID with intra-dialysis 10 mg if needed.  9. 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  Transitioning to Eliquis per [facility]  Objective:   BP 126/84 (BP Location: Right Arm)   Pulse 78   Temp 98.3 F (36.8 C) (Oral)   Resp 17   Ht 6\' 4"  (1.93 m)   Wt 91.2 kg   SpO2 97%   BMI 24.47 kg/m    Physical Exam: Gen: NAD, sitting in bed CVS: RRR Resp: clear Abd: distended, soft, nontender, midline wound dressed, PD cath RLQ dressed Ext: 1+ ankle edema, R great toe and R 5th toe blue, coolness improved, some mottling on sole of foot too. ACCESS: PD cath, L IJ [facility]  Labs: BMET Recent Labs  Lab 10/04/19 0222 10/04/19 0222 10/05/19 0630 10/05/19 0630 10/06/19 0222 10/06/19 1621 10/06/19 1932 10/07/19 0117 10/07/19 0343 10/08/19 0759 10/09/19 0333  NA 135   < > 131*   < > 137 136 136 135 135 135 135  K 4.1   < > 4.8   < > 4.0 4.0 4.7 4.3 4.3 4.3 4.1  CL 99  --  97*  --  107  --   --  100 101 99 100  CO2 26  --  21*  --  25  --   --  23 24 23 22   GLUCOSE 96  --  114*  --  108*  --   --  102* 98 97 109*  BUN 27*  --  37*  --  20  --   --  30* 30* 25* 34*  CREATININE 3.01*  --  3.88*  --  2.57*  --   --  3.32* 2.94* 2.80* 3.67*  CALCIUM 6.8*  --  6.8*  --  6.9*  --   --  6.5* 6.5* 7.2* 6.8*  PHOS 2.6  --  3.2  --  2.5  --   --   --  4.9*  --   --    < > = values in this interval not displayed.   CBC Recent Labs  Lab 10/06/19 2347 10/07/19 0343 10/08/19 0759 10/09/19 0333  WBC 9.7 9.6 13.3* 15.5*  HGB 8.5* 8.5* 8.1* 8.3*  HCT 26.6* 26.3* 26.1* 26.6*  MCV 91.4 91.0 93.5 93.3  PLT 228 237 259 317      Medications:    . apixaban  5 mg Oral BID  . atorvastatin  80 mg Oral Daily  . Chlorhexidine Gluconate Cloth  6 each Topical Q0600  . Chlorhexidine Gluconate Cloth  6 each Topical Q0600  . clopidogrel  75 mg Oral Daily  . diltiazem  180 mg Oral Daily  . docusate sodium  100 mg Oral Daily  . gabapentin  300 mg Oral QHS  . gentamicin cream  1 application Topical Daily  . midodrine  10 mg Oral TID WC     Madelon Lips, MD 10/09/2019, 8:37 AM

## 2019-10-09 NOTE — Progress Notes (Addendum)
   VASCULAR SURGERY ASSESSMENT & PLAN:   S/P LEFT FEMOROPOPLITEAL BYPASS: He has a brisk dorsalis pedis and posterior tibial signal with the Doppler.  On the right side he has a palpable dorsalis pedis pulse.  His incisions look fine.  He is currently on IV heparin and at this point it is safe from a vascular standpoint to start his Eliquis . I have ordered this.   SUBJECTIVE:   No complaints.  He is exercising his legs in bed.  PHYSICAL EXAM:   Vitals:   10/08/19 1638 10/08/19 2137 10/09/19 0300 10/09/19 0340  BP: 136/80 (!) 120/92  126/84  Pulse: 68 90  78  Resp: 18 19  17   Temp: 97.8 F (36.6 C) 98.2 F (36.8 C)  98.3 F (36.8 C)  TempSrc: Oral Oral  Oral  SpO2: 100% 98%  97%  Weight:   91.2 kg   Height:       His incisions in the left leg look fine. He has a palpable right dorsalis pedis pulse. He has a brisk dorsalis pedis and posterior tibial signal on the left with the Doppler.  LABS:   Lab Results  Component Value Date   WBC 15.5 (H) 10/09/2019   HGB 8.3 (L) 10/09/2019   HCT 26.6 (L) 10/09/2019   MCV 93.3 10/09/2019   PLT 317 10/09/2019   Lab Results  Component Value Date   CREATININE 3.67 (H) 10/09/2019   Lab Results  Component Value Date   INR 1.2 10/06/2019   CBG (last 3)  Recent Labs    10/07/19 1119 10/08/19 0749 10/08/19 1148  GLUCAP 134* 97 110*    PROBLEM LIST:    Principal Problem:   Peritonitis ([place]) Active Problems:   Septic shock (HCC)   Shortness of breath   Atrial fibrillation (HCC)   S/P thoracentesis   Ischemic ulcer of toe of right foot (HCC)   CURRENT MEDS:   . atorvastatin  80 mg Oral Daily  . Chlorhexidine Gluconate Cloth  6 each Topical Q0600  . Chlorhexidine Gluconate Cloth  6 each Topical Q0600  . clopidogrel  75 mg Oral Daily  . diltiazem  180 mg Oral Daily  . docusate sodium  100 mg Oral Daily  . gabapentin  300 mg Oral QHS  . gentamicin cream  1 application Topical Daily  . midodrine  10 mg Oral TID WC     Deitra Mayo Office: [phone] 10/09/2019

## 2019-10-09 NOTE — Progress Notes (Addendum)
ANTICOAGULATION CONSULT NOTE  Pharmacy Consult for heparin>> apixaban Indication: atheroembolism  No Known Allergies  Patient Measurements: Height: 6\' 4"  (193 cm) Weight: 91.2 kg (201 lb 1 oz) IBW/kg (Calculated) : 86.8  Vital Signs: Temp: 98.3 F (36.8 C) (06/27 0340) Temp Source: Oral (06/27 0340) BP: 126/84 (06/27 0340) Pulse Rate: 78 (06/27 0340)  Labs: Recent Labs    10/06/19 2347 10/07/19 0117 10/07/19 0343 10/07/19 2150 10/08/19 0759 10/08/19 1723 10/09/19 0333  HGB 8.5*   < > 8.5*  --  8.1*  --  8.3*  HCT 26.6*   < > 26.3*  --  26.1*  --  26.6*  PLT 228   < > 237  --  259  --  317  APTT  --   --  31   < > 120* 80* 82*  LABPROT 14.8  --   --   --   --   --   --   INR 1.2  --   --   --   --   --   --   CREATININE  --    < > 2.94*  --  2.80*  --  3.67*   < > = values in this interval not displayed.    Estimated Creatinine Clearance: 25.3 mL/min (A) (by C-G formula based on SCr of 3.67 mg/dL (H)).   Medical History: Past Medical History:  Diagnosis Date  . Anxiety   . Arthritis    knees , back , shoulders (10/12/2017)  . Atrial fibrillation ([place])   . CAD (coronary artery disease)    Mild nonobstructive disease at cardiac catheterization 2002  . Chronic back pain    "back of my neck; down thru my legs" (10/12/2017)  . COPD (chronic obstructive pulmonary disease) ([place])   . Diverticulitis   . Esophageal reflux   . ESRD (end stage renal disease) on dialysis [facility])    "TTS; [facility]" (11/23/2017)  . Essential hypertension   . Hepatitis C    states he no longer has this  . History of kidney stones   . History of syncope   . Hyperlipidemia   . Jerking 09/23/2014  . Myocardial infarction ([place]) 02/2017   "light one" (10/12/2017)  . Non-compliant behavior    non compliant with diaylsis per daughter  . PAT (paroxysmal atrial tachycardia) ([place])   . Peripheral arterial disease ([place])    Occluded left superficial femoral artery status post stent June 2016 - Dr. Trula Slade   . Pneumonia 1961  . Polycystic kidney, unspecified type   . Syncope 09/2014  . SYNCOPE 05/07/2010   Qualifier: Diagnosis of  By: Laurance Flatten, RN, BSN, Anderson Malta      Medications:  Medications Prior to Admission  Medication Sig Dispense Refill Last Dose  . albuterol (PROVENTIL HFA;VENTOLIN HFA) 108 (90 Base) MCG/ACT inhaler Inhale 2 puffs into the lungs every 6 (six) hours as needed for wheezing or shortness of breath.   Past Week at Unknown time  . albuterol (PROVENTIL) (2.5 MG/3ML) 0.083% nebulizer solution Inhale 3 mLs into the lungs 3 (three) times daily as needed for shortness of breath or wheezing.   09/30/2019 at Unknown time  . apixaban (ELIQUIS) 5 MG TABS tablet Take 1 tablet (5 mg total) by mouth 2 (two) times daily. 60 tablet 1 09/29/2019 at 2100  . atorvastatin (LIPITOR) 80 MG tablet Take 80 mg by mouth daily.   09/29/2019 at Unknown time  . BREO ELLIPTA 200-25 MCG/INH AEPB Inhale 1 puff into the  lungs daily as needed (for respiratory issues.).    Past Week at Unknown time  . calcium acetate (PHOSLO) 667 MG capsule Take 667 mg by mouth 3 (three) times daily with meals.   09/29/2019  . Darbepoetin Alfa (ARANESP) 60 MCG/0.3ML SOSY injection Inject 0.3 mLs (60 mcg total) into the vein every Wednesday with hemodialysis. 4.2 mL  09/28/2019  . diltiazem (CARDIZEM CD) 180 MG 24 hr capsule Take 1 capsule (180 mg total) by mouth daily. 90 capsule 1 09/29/2019  . docusate sodium (COLACE) 100 MG capsule Take 1 capsule (100 mg total) by mouth daily. 10 capsule 0 09/29/2019  . gabapentin (NEURONTIN) 300 MG capsule Take 1 capsule (300 mg total) by mouth at bedtime. 90 capsule 1 09/29/2019  . metoprolol tartrate (LOPRESSOR) 25 MG tablet Take 1 tablet (25 mg total) by mouth 2 (two) times daily. 180 tablet 1 09/29/2019 at 2100  . multivitamin (RENA-VIT) TABS tablet Take 1 tablet by mouth at bedtime. 90 tablet 1 09/29/2019  . ondansetron (ZOFRAN) 4 MG tablet Take 1 tablet (4 mg total) by mouth every 8 (eight) hours as  needed for nausea. 20 tablet 0 Past Week at Unknown time  . oxyCODONE-acetaminophen (PERCOCET) 10-325 MG tablet Take 1 tablet by mouth 5 (five) times daily as needed for pain.    09/29/2019  . pantoprazole (PROTONIX) 40 MG tablet Take 1 tablet (40 mg total) by mouth 2 (two) times daily. (Patient taking differently: Take 40 mg by mouth daily. ) 60 tablet 1 09/29/2019  . senna-docusate (SENOKOT-S) 8.6-50 MG tablet Take 1 tablet by mouth 2 (two) times daily as needed for moderate constipation.   Past Week at Unknown time  . varenicline (CHANTIX) 0.5 MG tablet Take 0.5 mg by mouth 2 (two) times daily.   09/29/2019    Assessment: 64 yo male R foot ischemia. He is on apixaban for afib (last dose ~ 9am on 6/22). Pharmacy to dose heparin for atheroembolism. Plans to restart apixaban today -PTT is  therapeutic  -hg= 8.3  Goal of Therapy:  Heparin level 0.3-0.7 units/ml aPTT 66-102 seconds Monitor platelets by anticoagulation protocol: Yes   Plan:  - apixban 5mg  po bid -discontinue heparin   Thank you for allowing pharmacy to be a part of this patient's care.  Hildred Laser, PharmD Clinical Pharmacist **Pharmacist phone directory can now be found on [email or web address] ([email or web address]).  Listed under [place].

## 2019-10-09 NOTE — Progress Notes (Signed)
PROGRESS NOTE  Aaron [name] JJH:[id number] DOB: December 16, 1955 DOA: 10/01/2019 PCP: Gwenlyn Saran [place]   LOS: 8 days   Brief narrative: 64 y.o. Male with PMH of ESRD on peritoneal dialysis, history of  permanent Atrial fibrillation on Eliquis who was recently admitted for small bowel obstruction  status post exploratory laparatomy with retention sutures was at dialysis on 6/18 when he became dizzy and had syncopal episode and respiratory arrest.  He was then sent to [facility] emergency department with he was noted to have elevated lactate at 6.6.  He was then transferred to [facility] when en route developed atrial fibrillation with rapid ventricular response and was given Cardizem iv which dropped his blood pressure.  He was then started on Neo-Synephrine and was admitted to the ICU.  Patient was subsequently stable and was transferred out of the ICU.   Subjective: 10/06/2019 he underwent -left common femoral endarterectomy angioplasty SFA and profunda femoris artery Stent to left common iliac artery, left external iliac artery, left lower extremity angiography, aortogram, right lower extremity angiography, stent to right SFA, left common femoral below-knee popliteal artery bypass.  Today, patient was seen and examined at bedside.  He denies have any major complaints at this time.  Assessment/Plan:  Principal Problem:   Peritonitis ([place]) Active Problems:   Septic shock (HCC)   Shortness of breath   Atrial fibrillation (HCC)   S/P thoracentesis   Ischemic ulcer of toe of right foot ([place])   Bilateral lower extremity ischemia/edema, pain/  Postop day #1 10/06/2019 status post -left common femoral endarterectomy angioplasty SFA and profunda femoris artery Stent to left common iliac artery, left external iliac artery, left lower extremity angiography, aortogram, right lower extremity angiography, stent to right SFA, left common femoral below-knee popliteal artery  bypass -Stable-reporting improved pain and swelling in the lower extremities -Positive pulses in the lower extremities bilaterally -Vascular team following, heparin DC'd -Transitioning to Eliquis.    -History of iliofemoral endarterectomy. -underwent CT angiogram with left external iliac artery and common femoral artery occlusion and left lower extremity ischemia and left great toe ulceration.    Syncopal Episode with hypotension BP remained stable, no further episodes Possibly secondary to peritonitis, volume depletion.  Currently off pressors. On midodrine trial.  2D echocardiogram on 05/27/2019 was 50 to 55%.  Atrial Fibrillation/RVR On presentation.  Seen by cardiology.  On Cardizem and Eliquis.  Beta-blockers on hold, could be restarted as needed.   Cardiology recommending outpatient follow-up with elective cardioversion after minimum of 3 weeks of anticoagulation if needed.  Follow-up recommended with Dr. Denman George, [facility].  Cardiology has signed off at this time.  Blood pressure has improved at this time.   Lactic acidosis, recent SBO s/p ex lap bowel resection , resolving. PD cath peritonitis, septic shock  Peritoneal fluid cultures showing Enterococcus and Proteus.    Continue Zosyn as per ID.  -WBC increased to 15.5 today.  Afebrile. -Ideal to remove the PD cath in the setting of PD cath peritonitis. -Nephrology and infectious disease following.  ESRD -Was on peritoneal dialysis, now on HD through left internal jugular dialysis catheter M/W/F.   Continue hemodialysis as per nephrology.  Left-sided pleural effusion, dyspnea on exertion dyspnea. Remained stable,  Pulmonary has started on short course of prednisone.   Status post left-sided thoracocentesis on 10/03/2019 with removal of 1200 mL of straw-colored fluid.  Appears to be transudative at this time. Appears comfortable  Debility, deconditioning.  Physical therapy has been consulted.  Recommend Home health PT  on discharge.  DVT prophylaxis: SCD's Start: 10/06/19 2256 SCDs Start: 10/01/19 0607 Code Status: Full code  Family Communication: No family at bedside  Status is: Inpatient  Remains inpatient appropriate because: Unsafe d/c plan, IV treatments appropriate due to intensity of illness or inability to take PO, Inpatient level of care appropriate due to severity of illness, ongoing hemodialysis, vascular surgery intervention,    Dispo: The patient is from: Home              Anticipated d/c is to: Likely home with home health.                Anticipated d/c date is: >3 days              Patient currently is not medically stable to d/c.  Consultants:  PCCM,   general surgery,   Nephrology  Vascular surgery  ID   Procedures:  Hemodialysis   Thoracocentesis left on 10/03/19  Antibiotics:  . Ceftazidime, vancomycin iv 6/20>6/22 . Zosyn 6/22>   Objective: Vitals:   10/08/19 2137 10/09/19 0340  BP: (!) 120/92 126/84  Pulse: 90 78  Resp: 19 17  Temp: 98.2 F (36.8 C) 98.3 F (36.8 C)  SpO2: 98% 97%    Intake/Output Summary (Last 24 hours) at 10/09/2019 0940 Last data filed at 10/09/2019 0300 Gross per 24 hour  Intake 120 ml  Output 1 ml  Net 119 ml   Filed Weights   10/07/19 2000 10/08/19 0349 10/09/19 0300  Weight: 91.7 kg 91.9 kg 91.2 kg   Body mass index is 24.47 kg/m.    Physical Exam  Constitution: Thin appearing male, alert, cooperative, no distress,  Psychiatric: Normal and stable mood and affect, cognition intact,   HEENT: Normocephalic, PERRL, otherwise with in Normal limits  Chest:Chest symmetric Cardio vascular:  S1/S2, RRR pulmonary: Clear to auscultation bilaterally, respirations unlabored, negative wheezes / crackles Abdomen: Soft, distended, abdominal wound with dressing in place, PD cath right lower quadrant with dressing Muscular skeletal: Limited exam - in bed, able to move all 4 extremities, Normal strength  Neuro: CNII-XII intact.  normal motor and sensation, reflexes intact  Extremities: Lower extremity edema improving,  right great toe, right fifth toe with some discoloration Skin: Dry, warm to touch, no rashes    Data Review: I have personally reviewed the following laboratory data and studies,  CBC: Recent Labs  Lab 10/06/19 0222 10/06/19 1621 10/06/19 1932 10/06/19 2347 10/07/19 0343 10/08/19 0759 10/09/19 0333  WBC 10.2  --   --  9.7 9.6 13.3* 15.5*  HGB 7.9*   < > 8.2* 8.5* 8.5* 8.1* 8.3*  HCT 25.3*   < > 24.0* 26.6* 26.3* 26.1* 26.6*  MCV 95.1  --   --  91.4 91.0 93.5 93.3  PLT 230  --   --  228 237 259 317   < > = values in this interval not displayed.   Basic Metabolic Panel: Recent Labs  Lab 10/04/19 0222 10/04/19 0222 10/05/19 0630 10/05/19 0630 10/06/19 0222 10/06/19 1621 10/06/19 1932 10/07/19 0117 10/07/19 0343 10/08/19 0759 10/09/19 0333  NA 135   < > 131*   < > 137   < > 136 135 135 135 135  K 4.1   < > 4.8   < > 4.0   < > 4.7 4.3 4.3 4.3 4.1  CL 99   < > 97*   < > 107  --   --  100  101 99 100  CO2 26   < > 21*   < > 25  --   --  23 24 23 22   GLUCOSE 96   < > 114*   < > 108*  --   --  102* 98 97 109*  BUN 27*   < > 37*   < > 20  --   --  30* 30* 25* 34*  CREATININE 3.01*   < > 3.88*   < > 2.57*  --   --  3.32* 2.94* 2.80* 3.67*  CALCIUM 6.8*   < > 6.8*   < > 6.9*  --   --  6.5* 6.5* 7.2* 6.8*  MG 1.4*  --  2.2  --  1.9  --   --   --  1.9  --   --   PHOS 2.6  --  3.2  --  2.5  --   --   --  4.9*  --   --    < > = values in this interval not displayed.   Liver Function Tests: Recent Labs  Lab 10/04/19 0222 10/05/19 0630 10/06/19 0222  AST 34 27 28  ALT 16 16 16   ALKPHOS 272* 242* 216*  BILITOT 0.6 0.5 0.3  PROT 5.1* 5.5* 5.4*  ALBUMIN 1.5* 1.5* 1.5*   No results for input(s): LIPASE, AMYLASE in the last 168 hours. No results for input(s): AMMONIA in the last 168 hours. Cardiac Enzymes: No results for input(s): CKTOTAL, CKMB, CKMBINDEX, TROPONINI in the last 168  hours. BNP (last 3 results) Recent Labs    05/26/19 1542 08/22/19 1930 10/01/19 0643  BNP 322.8* 1,578.0* 3,857.9*    ProBNP (last 3 results) No results for input(s): PROBNP in the last 8760 hours.  CBG: Recent Labs  Lab 10/06/19 2258 10/07/19 0732 10/07/19 1119 10/08/19 0749 10/08/19 1148  GLUCAP 101* 118* 134* 97 110*   Recent Results (from the past 240 hour(s))  MRSA PCR Screening     Status: None   Collection Time: 10/01/19  8:18 AM   Specimen: Nasal Mucosa; Nasopharyngeal  Result Value Ref Range Status   MRSA by PCR NEGATIVE NEGATIVE Final    Comment:        The GeneXpert MRSA Assay (FDA approved for NASAL specimens only), is one component of a comprehensive MRSA colonization surveillance program. It is not intended to diagnose MRSA infection nor to guide or monitor treatment for MRSA infections. Performed at [facility], [place] [address], [place], [place] [address]   Body fluid culture     Status: None   Collection Time: 10/01/19  1:20 PM   Specimen: Body Fluid  Result Value Ref Range Status   Specimen Description FLUID PERITONEAL DIALYSIS  Final   Special Requests Immunocompromised  Final   Gram Stain   Final    RARE WBC PRESENT, PREDOMINANTLY PMN NO ORGANISMS SEEN Performed at [facility], [address] [place], [place] [address]    Culture   Final    FEW ENTEROCOCCUS FAECALIS FEW SERRATIA MARCESCENS    Report Status 10/04/2019 FINAL  Final   Organism ID, Bacteria ENTEROCOCCUS FAECALIS  Final   Organism ID, Bacteria SERRATIA MARCESCENS  Final      Susceptibility   Enterococcus faecalis - MIC*    AMPICILLIN <=2 SENSITIVE Sensitive     VANCOMYCIN 1 SENSITIVE Sensitive     GENTAMICIN SYNERGY RESISTANT Resistant     * FEW ENTEROCOCCUS FAECALIS  Serratia marcescens - MIC*    CEFAZOLIN >=64 RESISTANT Resistant     CEFEPIME <=0.12 SENSITIVE Sensitive     CEFTAZIDIME <=1 SENSITIVE Sensitive     CEFTRIAXONE <=0.25 SENSITIVE Sensitive      CIPROFLOXACIN <=0.25 SENSITIVE Sensitive     GENTAMICIN <=1 SENSITIVE Sensitive     TRIMETH/SULFA <=20 SENSITIVE Sensitive     * FEW SERRATIA MARCESCENS  Body fluid culture (includes gram stain)     Status: None   Collection Time: 10/03/19  2:25 PM   Specimen: Pleural Fluid  Result Value Ref Range Status   Specimen Description FLUID PLEURAL LEFT  Final   Special Requests NONE  Final   Gram Stain   Final    WBC PRESENT,BOTH PMN AND MONONUCLEAR NO ORGANISMS SEEN CYTOSPIN SMEAR    Culture   Final    NO GROWTH 3 DAYS Performed at [facility], [address], [place], [place] [address]    Report Status 10/06/2019 FINAL  Final  Body fluid culture     Status: None   Collection Time: 10/04/19 10:30 AM   Specimen: Peritoneal Dialysate; Body Fluid  Result Value Ref Range Status   Specimen Description PERITONEAL DIALYSATE  Final   Special Requests NONE  Final   Gram Stain   Final    ABUNDANT WBC PRESENT,BOTH PMN AND MONONUCLEAR NO ORGANISMS SEEN    Culture   Final    MODERATE SERRATIA MARCESCENS FEW ENTEROCOCCUS FAECALIS CRITICAL RESULT CALLED TO, READ BACK BY AND VERIFIED WITH: RN Howard Pouch [phone] AT 1038 BY CM Performed at [facility], [place] [address], [place], [place] [address]    Report Status 10/08/2019 FINAL  Final   Organism ID, Bacteria SERRATIA MARCESCENS  Final   Organism ID, Bacteria ENTEROCOCCUS FAECALIS  Final      Susceptibility   Enterococcus faecalis - MIC*    AMPICILLIN <=2 SENSITIVE Sensitive     VANCOMYCIN 1 SENSITIVE Sensitive     GENTAMICIN SYNERGY RESISTANT Resistant     * FEW ENTEROCOCCUS FAECALIS   Serratia marcescens - MIC*    CEFAZOLIN >=64 RESISTANT Resistant     CEFEPIME 0.5 SENSITIVE Sensitive     CEFTAZIDIME <=1 SENSITIVE Sensitive     CEFTRIAXONE 0.5 SENSITIVE Sensitive     CIPROFLOXACIN <=0.25 SENSITIVE Sensitive     GENTAMICIN <=1 SENSITIVE Sensitive     TRIMETH/SULFA <=20 SENSITIVE Sensitive     * MODERATE SERRATIA MARCESCENS    Surgical pcr screen     Status: None   Collection Time: 10/05/19  9:53 PM   Specimen: Nasal Mucosa; Nasal Swab  Result Value Ref Range Status   MRSA, PCR NEGATIVE NEGATIVE Final   Staphylococcus aureus NEGATIVE NEGATIVE Final    Comment: (NOTE) The Xpert SA Assay (FDA approved for NASAL specimens in patients 54 years of age and older), is one component of a comprehensive surveillance program. It is not intended to diagnose infection nor to guide or monitor treatment. Performed at [facility], [place] [address] [place], [place] [address]      Studies: VAS [place] LOWER EXTREMITY SAPHENOUS VEIN MAPPING  Result Date: 10/05/2019 LOWER EXTREMITY VEIN MAPPING Indications:       pre op Other Indications: PAD Risk Factors:      Hypertension, hyperlipidemia, coronary artery disease.  Comparison Study: no prior Performing Technologist: Abram Sander RVS  Examination Guidelines: A complete evaluation includes B-mode imaging, spectral Doppler, color Doppler, and power Doppler as needed of all  accessible portions of each vessel. Bilateral testing is considered an integral part of a complete examination. Limited examinations for reoccurring indications may be performed as noted. Diagnosing physician: Curt Jews MD Electronically signed by Curt Jews MD on 10/05/2019 at 3:42:57 PM.    Final    HYBRID OR IMAGING ([place])  Result Date: 10/06/2019 There is no interpretation for this exam.  This order is for images obtained during a surgical procedure.  Please See "Surgeries" Tab for more information regarding the procedure.    Yaakov Guthrie, MD  [facility] Pager on [facility] 10/09/2019

## 2019-10-10 ENCOUNTER — Encounter (HOSPITAL_COMMUNITY): Payer: Self-pay | Admitting: Vascular Surgery

## 2019-10-10 LAB — CBC
HCT: 27.9 % — ABNORMAL LOW (ref 39.0–52.0)
Hemoglobin: 8.5 g/dL — ABNORMAL LOW (ref 13.0–17.0)
MCH: 29.1 pg (ref 26.0–34.0)
MCHC: 30.5 g/dL (ref 30.0–36.0)
MCV: 95.5 fL (ref 80.0–100.0)
Platelets: 340 10*3/uL (ref 150–400)
RBC: 2.92 MIL/uL — ABNORMAL LOW (ref 4.22–5.81)
RDW: 18.1 % — ABNORMAL HIGH (ref 11.5–15.5)
WBC: 14.8 10*3/uL — ABNORMAL HIGH (ref 4.0–10.5)
nRBC: 0.1 % (ref 0.0–0.2)

## 2019-10-10 LAB — BASIC METABOLIC PANEL
Anion gap: 14 (ref 5–15)
BUN: 44 mg/dL — ABNORMAL HIGH (ref 8–23)
CO2: 18 mmol/L — ABNORMAL LOW (ref 22–32)
Calcium: 6.4 mg/dL — CL (ref 8.9–10.3)
Chloride: 102 mmol/L (ref 98–111)
Creatinine, Ser: 4.46 mg/dL — ABNORMAL HIGH (ref 0.61–1.24)
GFR calc Af Amer: 15 mL/min — ABNORMAL LOW (ref >60)
GFR calc non Af Amer: 13 mL/min — ABNORMAL LOW (ref >60)
Glucose, Bld: 87 mg/dL (ref 70–99)
Potassium: 4.1 mmol/L (ref 3.5–5.1)
Sodium: 134 mmol/L — ABNORMAL LOW (ref 135–145)

## 2019-10-10 LAB — HEPATIC FUNCTION PANEL
ALT: 15 U/L (ref 0–44)
AST: 23 U/L (ref 15–41)
Albumin: 1.5 g/dL — ABNORMAL LOW (ref 3.5–5.0)
Alkaline Phosphatase: 153 U/L — ABNORMAL HIGH (ref 38–126)
Bilirubin, Direct: 0.1 mg/dL (ref 0.0–0.2)
Indirect Bilirubin: 0.5 mg/dL (ref 0.3–0.9)
Total Bilirubin: 0.6 mg/dL (ref 0.3–1.2)
Total Protein: 4.6 g/dL — ABNORMAL LOW (ref 6.5–8.1)

## 2019-10-10 MED ORDER — DARBEPOETIN ALFA 60 MCG/0.3ML IJ SOSY: 60.0000 ug | PREFILLED_SYRINGE | INTRAMUSCULAR | Status: DC

## 2019-10-10 MED ORDER — DARBEPOETIN ALFA 100 MCG/0.5ML IJ SOSY
100.0000 ug | PREFILLED_SYRINGE | INTRAMUSCULAR | Status: DC
  Filled 2019-10-10: qty 0.5

## 2019-10-10 MED ORDER — MIDODRINE HCL 5 MG PO TABS
ORAL_TABLET | ORAL | Status: AC
  Filled 2019-10-10: qty 2

## 2019-10-10 MED ORDER — CALCIUM GLUCONATE-NACL 1-0.675 GM/50ML-% IV SOLN
1.0000 g | Freq: Once | INTRAVENOUS | Status: AC
  Administered 2019-10-10: 1000 mg via INTRAVENOUS
  Filled 2019-10-10: qty 50

## 2019-10-10 MED ORDER — HEPARIN SODIUM (PORCINE) 1000 UNIT/ML IJ SOLN
INTRAMUSCULAR | Status: AC
  Administered 2019-10-10: 4200 [IU]
  Filled 2019-10-10: qty 5

## 2019-10-10 NOTE — Progress Notes (Signed)
Critical lab  Calcium 6.7.  Provider informed.  Provider requested LFT stat.

## 2019-10-10 NOTE — Progress Notes (Signed)
Patient ID: Aaron [name], male   DOB: Aug 25, 1955, 64 y.o.   MRN: [id number]          [facility] for Infectious Disease    Date of Admission:  10/01/2019   Total days of antibiotics 9        Day 7 piperacillin tazobactam  [name] is improving on therapy for polymicrobial peritonitis related to his retained peritoneal dialysis catheter.  We are awaiting word from [place] surgery to see when they feel the catheter can be removed.         Michel Bickers, MD [facility] [phone] pager   [phone] cell 10/10/2019, 9:52 AM

## 2019-10-10 NOTE — Discharge Instructions (Signed)

## 2019-10-10 NOTE — Progress Notes (Signed)
[facility] Progress Note   Assessment/ Plan:   OP HD:MWF [facility] 4h 400/600 RIJ TDC 90kg Hep none - epo 2400 tiw - Fe 50mg / wk  Assessment/ Plan: 1. Syncope/ resp arrest - no further episodes , on nasal O2.  Off pressors now and stable hemodynamically.    2. Refractory PD cath peritonitis - TNC 5314, 94% pmn's. WBC 30k--> 13.3 with treatment.  Vanc/ ceftaz 6/19--> changed to zosyn 6/22 evening.  There was some question if PD cath was exposed in wound, appreciate gen surg input. Initial plan to treat, allow midline wound to heal more before PD cath removal. Cultures from 6/19 growing enterococcus and Serratia, sensitivities reviewed, cultures 6/22 growing serratia and cell counts consistent with refractory PD peritonitis.   - Discussed with ID per previous nephrology attending --> would benefit from PD catheter removal in the setting of refractory PD cath peritonitis - have paged surgery to discuss - extender is discussing with MD further and they will contact me back; previously seen by Dr. Marlou Starks   3. PAD: s/p bypass grafting 6/24  4. ESRD - on HD using TDC for now.  - HD on schedule MWF.  Discontinued the potassium PRN   5. Hyperkalemia - resolved.   6. Abd wound - slow healing wound after SBO surgery x 2 during May hospital stay  7. COPD - per primary  8. Hypotension: on midodrine 10 TID with intra-dialysis 10 mg if needed.  9. Anemia ckd - s/p aranesp on Monday 60ug (gets epo at op HD) - reordered for every Monday dosing with aranesp 100 mcg for 6/28 onward.   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  Replete calcium x 1 on 6/28.  phos in range.   Subjective:    Last HD on 6/25 with 2.5 kg UF.  States that HD is going ok.  Patient asks about plan for PD catheter removal.  Spoke to surgery PA via phone this AM and awaiting a call back re: update.    Review of systems: denies nausea or vomiting  Denies shortness of breath  Denies  chest pain   Objective:   BP 112/87 (BP Location: Right Arm)   Pulse 79   Temp 98.3 F (36.8 C) (Oral)   Resp 17   Ht 6\' 4"  (1.93 m)   Wt 93.4 kg   SpO2 96%   BMI 25.06 kg/m   Physical Exam: GNF:AOZHY male seated in bed in NAD CVS:S1S2 no rub Resp: clear and unlabored at rest Abd: distended, soft, nontender, midline wound dressed, PD cath RLQ dressed Ext: 1+ ankle edema bilateral lower extremities ACCESS: PD cath intact, L IJ [facility] Neuro - alert and oriented x3 provides hx and follows commands  Labs: BMET Recent Labs  Lab 10/04/19 0222 10/04/19 0222 10/05/19 0630 10/05/19 0630 10/06/19 0222 10/06/19 0222 10/06/19 1621 10/06/19 1932 10/07/19 0117 10/07/19 0343 10/08/19 0759 10/09/19 0333 10/10/19 0451  NA 135   < > 131*   < > 137   < > 136 136 135 135 135 135 134*  K 4.1   < > 4.8   < > 4.0   < > 4.0 4.7 4.3 4.3 4.3 4.1 4.1  CL 99   < > 97*  --  107  --   --   --  100 101 99 100 102  CO2 26   < > 21*  --  25  --   --   --  23  24 23 22  18*  GLUCOSE 96   < > 114*  --  108*  --   --   --  102* 98 97 109* 87  BUN 27*   < > 37*  --  20  --   --   --  30* 30* 25* 34* 44*  CREATININE 3.01*   < > 3.88*  --  2.57*  --   --   --  3.32* 2.94* 2.80* 3.67* 4.46*  CALCIUM 6.8*   < > 6.8*  --  6.9*  --   --   --  6.5* 6.5* 7.2* 6.8* 6.4*  PHOS 2.6  --  3.2  --  2.5  --   --   --   --  4.9*  --   --   --    < > = values in this interval not displayed.   CBC Recent Labs  Lab 10/07/19 0343 10/08/19 0759 10/09/19 0333 10/10/19 0451  WBC 9.6 13.3* 15.5* 14.8*  HGB 8.5* 8.1* 8.3* 8.5*  HCT 26.3* 26.1* 26.6* 27.9*  MCV 91.0 93.5 93.3 95.5  PLT 237 259 317 340      Medications:    . apixaban  5 mg Oral BID  . atorvastatin  80 mg Oral Daily  . Chlorhexidine Gluconate Cloth  6 each Topical Q0600  . Chlorhexidine Gluconate Cloth  6 each Topical Q0600  . clopidogrel  75 mg Oral Daily  . diltiazem  180 mg Oral Daily  . docusate sodium  100 mg Oral Daily  . gabapentin  300  mg Oral QHS  . gentamicin cream  1 application Topical Daily  . midodrine  10 mg Oral TID WC     Claudia Desanctis, MD 10/10/2019, 9:37 AM

## 2019-10-10 NOTE — Progress Notes (Addendum)
  Progress Note    10/10/2019 7:38 AM 4 Days Post-Op  Subjective:  No complaints. Eager to go home soon. Sitting up in bed eating breakfast   Vitals:   10/09/19 2022 10/10/19 0529  BP: 122/80 112/87  Pulse: 78 79  Resp: 17 17  Temp: 98.3 F (36.8 C) 98.3 F (36.8 C)  SpO2: 100% 96%   Physical Exam: Cardiac:  regular Lungs: non labored Incisions:  Left femoral incision clean, dry and intact. Dry gauze reapplied to left groin. Left popliteal incision clean, dry and intact. Healing well.  Extremities: 2+ femoral pulses bilaterally. Palpable DP pulses bilaterally. Left great toe wound dressed. Right great toe with some ischemic changes. Dry superficial eschar on 2st toe. Bilateral feet warm. Motor and sensory intact.  Abdomen:  Mildly distended, non tender. Dressings clean, dry and intact Neurologic: alert and oriented  CBC    Component Value Date/Time   WBC 14.8 (H) 10/10/2019 0451   RBC 2.92 (L) 10/10/2019 0451   HGB 8.5 (L) 10/10/2019 0451   HCT 27.9 (L) 10/10/2019 0451   PLT 340 10/10/2019 0451   MCV 95.5 10/10/2019 0451   MCH 29.1 10/10/2019 0451   MCHC 30.5 10/10/2019 0451   RDW 18.1 (H) 10/10/2019 0451   LYMPHSABS 0.7 09/20/2019 1306   MONOABS 1.0 09/20/2019 1306   EOSABS 0.0 09/20/2019 1306   BASOSABS 0.0 09/20/2019 1306    BMET    Component Value Date/Time   NA 134 (L) 10/10/2019 0451   K 4.1 10/10/2019 0451   CL 102 10/10/2019 0451   CO2 18 (L) 10/10/2019 0451   GLUCOSE 87 10/10/2019 0451   BUN 44 (H) 10/10/2019 0451   CREATININE 4.46 (H) 10/10/2019 0451   CALCIUM 6.4 (LL) 10/10/2019 0451   GFRNONAA 13 (L) 10/10/2019 0451   GFRAA 15 (L) 10/10/2019 0451    INR    Component Value Date/Time   INR 1.2 10/06/2019 2347     Intake/Output Summary (Last 24 hours) at 10/10/2019 0738 Last data filed at 10/09/2019 1815 Gross per 24 hour  Intake 700 ml  Output --  Net 700 ml     Assessment/Plan:  64 y.o. male is s/p left femoral to popliteal bypass  4 Days Post-Op. Bilateral lower extremities well perfused and warm. Palpable DP pulses bilaterally. Doppler DP/PT bilaterally. HD per nephrology. Remains on zosyn for peritonitis. Eliquis restarted yesterday. Continue mobilization .   DVT prophylaxis:  Eliquis   Karoline Caldwell, PA-C [facility] [phone] 10/10/2019 7:38 AM   I have independently interviewed and examined patient and agree with PA assessment and plan above.   Cloy Cozzens C. Donzetta Matters, MD [facility] [place] Office: [phone] Pager: [phone]

## 2019-10-10 NOTE — Evaluation (Signed)
Occupational Therapy Evaluation Patient Details Name: Aaron [name] MRN: [id number] DOB: 25-May-1955 Today's Date: 10/10/2019    History of Present Illness Patient is a 64 y/o male admitted with syncope during HD with respiratory arrest. 6/24 pt with left femoral endarterectomy with iliac stent and left fem-pop BPG as well as ret femoral artery cannulation and stent.PMHx: pt with ESRD normally on peritoneal dialysist but has been on HD since SBO s/p ex lap 5/17 with repeat 5/24, discitis, HLD, CAD, PAD, A-fib with RVR, HTN, COPD, CHF.   Clinical Impression   This 64 y/o male presents with the above. Pt typically using RW PTA for mobility tasks, receiving assist from ex-spouse for ADL completion. Pt very pleasant and willing to participate in session. He currently presents with limitations including generalized weakness and LLE pain. Pt requiring modA for sit<>stand from RW, once upright able to maintain static standing and side step along EOB with minguard assist (declined further mobility due to pain). He currently requires up to [facility] for LB ADL, setup/supervision for seated UB ADL. HR up to 138bpm with standing activity. He will benefit from continued acute OT services and currently recommend follow up [facility] services after discharge to maximize his overall safety and independence with ADL and mobility.     Follow Up Recommendations  Home health OT;Supervision/Assistance - 24 hour    Equipment Recommendations  None recommended by OT (pt's DME needs are met )           Precautions / Restrictions Precautions Precautions: Fall Precaution Comments: abdominal incision, wound bil hallux Restrictions Weight Bearing Restrictions: No      Mobility Bed Mobility Overal bed mobility: Needs Assistance Bed Mobility: Supine to Sit;Sit to Supine     Supine to sit: Min guard;HOB elevated Sit to supine: Min guard   General bed mobility comments: close minguard for lines/safety, increased  time/effort to move LLE onto EOB when returning to supine   Transfers Overall transfer level: Needs assistance Equipment used: Rolling walker (2 wheeled) Transfers: Sit to/from Stand Sit to Stand: Mod assist;From elevated surface         General transfer comment: pt requiring increased boosting assist from elevated EOB today, minguard-minA for balance once upright in standing    Balance Overall balance assessment: Needs assistance Sitting-balance support: Feet supported Sitting balance-Leahy Scale: Good     Standing balance support: Bilateral upper extremity supported Standing balance-Leahy Scale: Poor Standing balance comment: RW for UE support                            ADL either performed or assessed with clinical judgement   ADL Overall ADL's : Needs assistance/impaired Eating/Feeding: Modified independent;Sitting   Grooming: Wash/dry face;Set up;Sitting   Upper Body Bathing: Set up;Supervision/ safety;Sitting   Lower Body Bathing: Moderate assistance;Sitting/lateral leans;Sit to/from stand   Upper Body Dressing : Set up;Supervision/safety;Sitting   Lower Body Dressing: Moderate assistance;Sit to/from stand;Sitting/lateral leans Lower Body Dressing Details (indicate cue type and reason): pt with difficulty reaching his feet Toilet Transfer: Minimal assistance;Moderate assistance;Stand-pivot Toilet Transfer Details (indicate cue type and reason): simulated via transfer to/from EOB-sidesteps along EOB today; required modA to stand initially Toileting- Clothing Manipulation and Hygiene: Minimal assistance;Sitting/lateral lean;Sit to/from stand       Functional mobility during ADLs: Minimal assistance;Moderate assistance;Rolling walker General ADL Comments: pt with limitations due to generalized fatigue, LLE pain, overall weakness  Pertinent Vitals/Pain Pain Assessment: Faces Faces Pain Scale: Hurts even more Pain  Location: LLE Pain Descriptors / Indicators: Heaviness;Discomfort;Sore     Hand Dominance Right   Extremity/Trunk Assessment Upper Extremity Assessment Upper Extremity Assessment: Generalized weakness   Lower Extremity Assessment Lower Extremity Assessment: Defer to PT evaluation       Communication Communication Communication: No difficulties   Cognition Arousal/Alertness: Awake/alert Behavior During Therapy: WFL for tasks assessed/performed Overall Cognitive Status: Within Functional Limits for tasks assessed                                     General Comments  HR up to 138 with standing activity     Exercises Exercises: General Lower Extremity General Exercises - Lower Extremity Ankle Circles/Pumps: AROM;Both;10 reps Long Arc Quad: AROM;Both;15 reps;Seated Hip Flexion/Marching: AROM;Both;10 reps;Seated   Shoulder Instructions      Home Living Family/patient expects to be discharged to:: Private residence Living Arrangements: Alone Available Help at Discharge: Family;Friend(s);Available 24 hours/day Type of Home: Apartment Home Access: Level entry     Home Layout: One level     Bathroom Shower/Tub: Teacher, early years/pre: Standard     Home Equipment: Cane - single point;Grab bars - tub/shower;Grab bars - toilet;Bedside commode;Shower seat;Wheelchair - [employer] - 4 wheels;Walker - 2 wheels;Walker - standard;Other (comment)   Additional Comments: pt has lift chair which he sleeps in      Prior Functioning/Environment Level of Independence: Needs assistance  Gait / Transfers Assistance Needed: uses RW at home and does not leave the house without supervision, uses motorized cart in stores ADL's / Homemaking Assistance Needed: ex-wife assists with medication and meals, has been helping with bathing/dressing as of recent             OT Problem List: Decreased strength;Decreased range of motion;Decreased activity  tolerance;Impaired balance (sitting and/or standing);Decreased knowledge of use of DME or AE;Decreased knowledge of precautions;Pain;Cardiopulmonary status limiting activity      OT Treatment/Interventions: Self-care/ADL training;Therapeutic exercise;Energy conservation;DME and/or AE instruction;Therapeutic activities;Patient/family education;Balance training    OT Goals(Current goals can be found in the care plan section) Acute Rehab OT Goals Patient Stated Goal: return home OT Goal Formulation: With patient Time For Goal Achievement: 10/24/19 Potential to Achieve Goals: Good ADL Goals Pt Will Perform Grooming: with supervision;standing;sitting Pt Will Perform Lower Body Bathing: with min guard assist;sitting/lateral leans;sit to/from stand (AE PRN) Pt Will Perform Lower Body Dressing: with min guard assist;sit to/from stand;sitting/lateral leans;with adaptive equipment (AE PRN) Pt Will Transfer to Toilet: with supervision;ambulating Pt Will Perform Toileting - Clothing Manipulation and hygiene: with supervision;sit to/from stand;sitting/lateral leans Pt/caregiver will Perform Home Exercise Program: Increased strength;Both right and left upper extremity;With written HEP provided;Independently;With theraband  OT Frequency: Min 2X/week   Barriers to D/C:            Co-evaluation              AM-PAC OT "6 Clicks" Daily Activity     Outcome Measure Help from another person eating meals?: None Help from another person taking care of personal grooming?: A Little Help from another person toileting, which includes using toliet, bedpan, or urinal?: A Lot Help from another person bathing (including washing, rinsing, drying)?: A Lot Help from another person to put on and taking off regular upper body clothing?: A Little Help from another person to put on and taking off regular lower  body clothing?: A Lot 6 Click Score: 16   End of Session Equipment Utilized During Treatment: Advertising account executive Communication: Mobility status  Activity Tolerance: Patient tolerated treatment well Patient left: in bed;with call bell/phone within reach  OT Visit Diagnosis: Unsteadiness on feet (R26.81);Other abnormalities of gait and mobility (R26.89);Pain Pain - Right/Left: Left Pain - part of body: Leg;Ankle and joints of foot                Time: 1040-1109 OT Time Calculation (min): 29 min Charges:  OT General Charges $OT Visit: 1 Visit OT Evaluation $OT Eval Moderate Complexity: 1 Mod OT Treatments $Self Care/Home Management : 8-22 mins  Lou Cal, OT Acute Rehabilitation Services Pager [phone] Office [phone]   Raymondo Band 10/10/2019, 12:44 PM

## 2019-10-10 NOTE — Progress Notes (Signed)
PROGRESS NOTE  Aaron [name] OIN:[id number] DOB: Sep 11, 1955 DOA: 10/01/2019 PCP: Gwenlyn Saran Fleischmanns   LOS: 9 days   Brief narrative: 64 y.o. Male with PMH of ESRD on peritoneal dialysis, history of  permanent Atrial fibrillation on Eliquis who was recently admitted for small bowel obstruction  status post exploratory laparatomy with retention sutures was at dialysis on 6/18 when he became dizzy and had syncopal episode and respiratory arrest.  He was then sent to [facility] emergency department with he was noted to have elevated lactate at 6.6.  He was then transferred to [facility] when en route developed atrial fibrillation with rapid ventricular response and was given Cardizem iv which dropped his blood pressure.  He was then started on Neo-Synephrine and was admitted to the ICU.  Patient was subsequently stable and was transferred out of the ICU.   Subjective: 10/06/2019 he underwent -left common femoral endarterectomy angioplasty SFA and profunda femoris artery Stent to left common iliac artery, left external iliac artery, left lower extremity angiography, aortogram, right lower extremity angiography, stent to right SFA, left common femoral below-knee popliteal artery bypass.  Patient seen and examined.  He denies having any major complaints at this time.  Assessment/Plan:  Principal Problem:   Peritonitis ([place]) Active Problems:   Septic shock (HCC)   Shortness of breath   Atrial fibrillation (HCC)   S/P thoracentesis   Ischemic ulcer of toe of right foot (HCC)   Bilateral lower extremity ischemia/edema, pain/  10/06/2019 status post -left common femoral endarterectomy angioplasty SFA and profunda femoris artery Stent to left common iliac artery, left external iliac artery, left lower extremity angiography, aortogram, right lower extremity angiography, stent to right SFA, left common femoral below-knee popliteal artery bypass -Stable-reporting improved  pain, swelling in the lower extremities resolved. -Positive pulses in the lower extremities bilaterally -Vascular team following, heparin DC'd -Transitioned to Eliquis.    -History of iliofemoral endarterectomy. -underwent CT angiogram with left external iliac artery and common femoral artery occlusion and left lower extremity ischemia and left great toe ulceration.    Syncopal Episode with hypotension BP remained stable, no further episodes Possibly secondary to peritonitis, volume depletion.  Currently off pressors. On midodrine trial.  2D echocardiogram on 05/27/2019 was 50 to 55%.  Atrial Fibrillation/RVR On presentation.  Seen by cardiology.  Continue current medications as per cardiology.  Cardiology recommending outpatient follow-up with elective cardioversion after minimum of 3 weeks of anticoagulation if needed.  Follow-up recommended with Dr. Denman George, [facility].  Cardiology has signed off at this time.  Blood pressure has improved at this time.   Lactic acidosis, recent SBO s/p ex lap bowel resection , resolving. PD cath peritonitis, septic shock  Peritoneal fluid cultures showing Enterococcus and Proteus.    Continue Zosyn as per ID.  -WBC 14.8 today. -Ideal to remove the PD cath in the setting of PD cath peritonitis. -Nephrology and infectious disease following.  Nephrology in discussion with surgery.  ESRD -Was on peritoneal dialysis, now on HD through left internal jugular dialysis catheter M/W/F.   Continue hemodialysis as per nephrology.  Left-sided pleural effusion, dyspnea on exertion dyspnea. Remained stable,  Pulmonary has started on short course of prednisone.   Status post left-sided thoracocentesis on 10/03/2019 with removal of 1200 mL of straw-colored fluid.  Appeared to be transudative at this time. Appears comfortable  Debility, deconditioning.  Physical therapy has been consulted.  Recommend Home health PT on discharge.  DVT prophylaxis: SCD's Start:  10/06/19  2256 SCDs Start: 10/01/19 0607 Code Status: Full code  Family Communication: No family at bedside  Status is: Inpatient  Remains inpatient appropriate because: Unsafe d/c plan, IV treatments appropriate due to intensity of illness or inability to take PO, Inpatient level of care appropriate due to severity of illness, ongoing hemodialysis, vascular surgery intervention,    Dispo: The patient is from: Home              Anticipated d/c is to: Likely home with home health.                Anticipated d/c date is: >3 days              Patient currently is not medically stable to d/c.  Consultants:  PCCM   general surgery   Nephrology  Vascular surgery  ID   Procedures:  Hemodialysis   Thoracocentesis left on 10/03/19  Antibiotics:   Ceftazidime, vancomycin iv 6/20>6/22  Zosyn 6/22>   Objective: Vitals:   10/09/19 2022 10/10/19 0529  BP: 122/80 112/87  Pulse: 78 79  Resp: 17 17  Temp: 98.3 F (36.8 C) 98.3 F (36.8 C)  SpO2: 100% 96%    Intake/Output Summary (Last 24 hours) at 10/10/2019 1026 Last data filed at 10/10/2019 0827 Gross per 24 hour  Intake 720 ml  Output --  Net 720 ml   Filed Weights   10/08/19 0349 10/09/19 0300 10/10/19 0007  Weight: 91.9 kg 91.2 kg 93.4 kg   Body mass index is 25.06 kg/m.    Physical Exam  Constitution: Thin appearing male, alert, cooperative, no distress,  Psychiatric: Normal and stable mood and affect, cognition intact,   HEENT: Normocephalic, PERRL, otherwise with in Normal limits  Chest:Chest symmetric Cardio vascular:  S1/S2, RRR pulmonary: Clear to auscultation bilaterally, no rhonchi or wheezing Abdomen: Soft, distended, abdominal wound with dressing in place, PD cath right lower quadrant with dressing Muscular skeletal: Limited exam - in bed, able to move all 4 extremities, Normal strength  Neuro: CNII-XII intact. normal motor and sensation, reflexes intact  Extremities: Lower extremity edema  improving,  right great toe, right fifth toe with some discoloration Skin: Dry, warm to touch, no rashes    Data Review: I have personally reviewed the following laboratory data and studies,  CBC: Recent Labs  Lab 10/06/19 2347 10/07/19 0343 10/08/19 0759 10/09/19 0333 10/10/19 0451  WBC 9.7 9.6 13.3* 15.5* 14.8*  HGB 8.5* 8.5* 8.1* 8.3* 8.5*  HCT 26.6* 26.3* 26.1* 26.6* 27.9*  MCV 91.4 91.0 93.5 93.3 95.5  PLT 228 237 259 317 224   Basic Metabolic Panel: Recent Labs  Lab 10/04/19 0222 10/04/19 0222 10/05/19 0630 10/05/19 0630 10/06/19 0222 10/06/19 1621 10/07/19 0117 10/07/19 0343 10/08/19 0759 10/09/19 0333 10/10/19 0451  NA 135   < > 131*   < > 137   < > 135 135 135 135 134*  K 4.1   < > 4.8   < > 4.0   < > 4.3 4.3 4.3 4.1 4.1  CL 99   < > 97*   < > 107  --  100 101 99 100 102  CO2 26   < > 21*   < > 25  --  23 24 23 22  18*  GLUCOSE 96   < > 114*   < > 108*  --  102* 98 97 109* 87  BUN 27*   < > 37*   < > 20  --  30*  30* 25* 34* 44*  CREATININE 3.01*   < > 3.88*   < > 2.57*  --  3.32* 2.94* 2.80* 3.67* 4.46*  CALCIUM 6.8*   < > 6.8*   < > 6.9*  --  6.5* 6.5* 7.2* 6.8* 6.4*  MG 1.4*  --  2.2  --  1.9  --   --  1.9  --   --   --   PHOS 2.6  --  3.2  --  2.5  --   --  4.9*  --   --   --    < > = values in this interval not displayed.   Liver Function Tests: Recent Labs  Lab 10/04/19 0222 10/05/19 0630 10/06/19 0222 10/10/19 0451  AST 34 27 28 23   ALT 16 16 16 15   ALKPHOS 272* 242* 216* 153*  BILITOT 0.6 0.5 0.3 0.6  PROT 5.1* 5.5* 5.4* 4.6*  ALBUMIN 1.5* 1.5* 1.5* 1.5*   No results for input(s): LIPASE, AMYLASE in the last 168 hours. No results for input(s): AMMONIA in the last 168 hours. Cardiac Enzymes: No results for input(s): CKTOTAL, CKMB, CKMBINDEX, TROPONINI in the last 168 hours. BNP (last 3 results) Recent Labs    05/26/19 1542 08/22/19 1930 10/01/19 0643  BNP 322.8* 1,578.0* 3,857.9*    ProBNP (last 3 results) No results for  input(s): PROBNP in the last 8760 hours.  CBG: Recent Labs  Lab 10/06/19 2258 10/07/19 0732 10/07/19 1119 10/08/19 0749 10/08/19 1148  GLUCAP 101* 118* 134* 97 110*   Recent Results (from the past 240 hour(s))  MRSA PCR Screening     Status: None   Collection Time: 10/01/19  8:18 AM   Specimen: Nasal Mucosa; Nasopharyngeal  Result Value Ref Range Status   MRSA by PCR NEGATIVE NEGATIVE Final    Comment:        The GeneXpert MRSA Assay (FDA approved for NASAL specimens only), is one component of a comprehensive MRSA colonization surveillance program. It is not intended to diagnose MRSA infection nor to guide or monitor treatment for MRSA infections. Performed at [facility], [place] [address], [place], [place] [address]   Body fluid culture     Status: None   Collection Time: 10/01/19  1:20 PM   Specimen: Body Fluid  Result Value Ref Range Status   Specimen Description FLUID PERITONEAL DIALYSIS  Final   Special Requests Immunocompromised  Final   Gram Stain   Final    RARE WBC PRESENT, PREDOMINANTLY PMN NO ORGANISMS SEEN Performed at [facility], [address], [place], [place] [address]    Culture   Final    FEW ENTEROCOCCUS FAECALIS FEW SERRATIA MARCESCENS    Report Status 10/04/2019 FINAL  Final   Organism ID, Bacteria ENTEROCOCCUS FAECALIS  Final   Organism ID, Bacteria SERRATIA MARCESCENS  Final      Susceptibility   Enterococcus faecalis - MIC*    AMPICILLIN <=2 SENSITIVE Sensitive     VANCOMYCIN 1 SENSITIVE Sensitive     GENTAMICIN SYNERGY RESISTANT Resistant     * FEW ENTEROCOCCUS FAECALIS   Serratia marcescens - MIC*    CEFAZOLIN >=64 RESISTANT Resistant     CEFEPIME <=0.12 SENSITIVE Sensitive     CEFTAZIDIME <=1 SENSITIVE Sensitive     CEFTRIAXONE <=0.25 SENSITIVE Sensitive     CIPROFLOXACIN <=0.25 SENSITIVE Sensitive     GENTAMICIN <=1 SENSITIVE Sensitive     TRIMETH/SULFA <=20 SENSITIVE Sensitive     *  FEW SERRATIA MARCESCENS  Body  fluid culture (includes gram stain)     Status: None   Collection Time: 10/03/19  2:25 PM   Specimen: Pleural Fluid  Result Value Ref Range Status   Specimen Description FLUID PLEURAL LEFT  Final   Special Requests NONE  Final   Gram Stain   Final    WBC PRESENT,BOTH PMN AND MONONUCLEAR NO ORGANISMS SEEN CYTOSPIN SMEAR    Culture   Final    NO GROWTH 3 DAYS Performed at [facility], [address] [place], [place] [address]    Report Status 10/06/2019 FINAL  Final  Body fluid culture     Status: None   Collection Time: 10/04/19 10:30 AM   Specimen: Peritoneal Dialysate; Body Fluid  Result Value Ref Range Status   Specimen Description PERITONEAL DIALYSATE  Final   Special Requests NONE  Final   Gram Stain   Final    ABUNDANT WBC PRESENT,BOTH PMN AND MONONUCLEAR NO ORGANISMS SEEN    Culture   Final    MODERATE SERRATIA MARCESCENS FEW ENTEROCOCCUS FAECALIS CRITICAL RESULT CALLED TO, READ BACK BY AND VERIFIED WITH: RN Howard Pouch [phone] AT 1038 BY CM Performed at [facility], [place] [address] [place], [place] [address]    Report Status 10/08/2019 FINAL  Final   Organism ID, Bacteria SERRATIA MARCESCENS  Final   Organism ID, Bacteria ENTEROCOCCUS FAECALIS  Final      Susceptibility   Enterococcus faecalis - MIC*    AMPICILLIN <=2 SENSITIVE Sensitive     VANCOMYCIN 1 SENSITIVE Sensitive     GENTAMICIN SYNERGY RESISTANT Resistant     * FEW ENTEROCOCCUS FAECALIS   Serratia marcescens - MIC*    CEFAZOLIN >=64 RESISTANT Resistant     CEFEPIME 0.5 SENSITIVE Sensitive     CEFTAZIDIME <=1 SENSITIVE Sensitive     CEFTRIAXONE 0.5 SENSITIVE Sensitive     CIPROFLOXACIN <=0.25 SENSITIVE Sensitive     GENTAMICIN <=1 SENSITIVE Sensitive     TRIMETH/SULFA <=20 SENSITIVE Sensitive     * MODERATE SERRATIA MARCESCENS  Surgical pcr screen     Status: None   Collection Time: 10/05/19  9:53 PM   Specimen: Nasal Mucosa; Nasal Swab  Result Value Ref Range Status   MRSA, PCR NEGATIVE  NEGATIVE Final   Staphylococcus aureus NEGATIVE NEGATIVE Final    Comment: (NOTE) The Xpert SA Assay (FDA approved for NASAL specimens in patients 40 years of age and older), is one component of a comprehensive surveillance program. It is not intended to diagnose infection nor to guide or monitor treatment. Performed at [facility], [place] [address], [place], [place] [address]      Studies: VAS [place] LOWER EXTREMITY SAPHENOUS VEIN MAPPING  Result Date: 10/05/2019 LOWER EXTREMITY VEIN MAPPING Indications:       pre op Other Indications: PAD Risk Factors:      Hypertension, hyperlipidemia, coronary artery disease.  Comparison Study: no prior Performing Technologist: Abram Sander RVS  Examination Guidelines: A complete evaluation includes B-mode imaging, spectral Doppler, color Doppler, and power Doppler as needed of all accessible portions of each vessel. Bilateral testing is considered an integral part of a complete examination. Limited examinations for reoccurring indications may be performed as noted. Diagnosing physician: Curt Jews MD Electronically signed by Curt Jews MD on 10/05/2019 at 3:42:57 PM.    Final    HYBRID OR IMAGING ([place])  Result Date: 10/06/2019 There is no interpretation for this exam.  This order is for images obtained during a surgical procedure.  Please See "Surgeries" Tab for more information regarding the procedure.    Yaakov Guthrie, MD  [facility] Pager on [facility] 10/10/2019

## 2019-10-11 LAB — BASIC METABOLIC PANEL
Anion gap: 12 (ref 5–15)
BUN: 23 mg/dL (ref 8–23)
CO2: 24 mmol/L (ref 22–32)
Calcium: 6.8 mg/dL — ABNORMAL LOW (ref 8.9–10.3)
Chloride: 101 mmol/L (ref 98–111)
Creatinine, Ser: 3.32 mg/dL — ABNORMAL HIGH (ref 0.61–1.24)
GFR calc Af Amer: 22 mL/min — ABNORMAL LOW (ref >60)
GFR calc non Af Amer: 19 mL/min — ABNORMAL LOW (ref >60)
Glucose, Bld: 84 mg/dL (ref 70–99)
Potassium: 3.7 mmol/L (ref 3.5–5.1)
Sodium: 137 mmol/L (ref 135–145)

## 2019-10-11 LAB — CBC
HCT: 30 % — ABNORMAL LOW (ref 39.0–52.0)
Hemoglobin: 9.3 g/dL — ABNORMAL LOW (ref 13.0–17.0)
MCH: 29.7 pg (ref 26.0–34.0)
MCHC: 31 g/dL (ref 30.0–36.0)
MCV: 95.8 fL (ref 80.0–100.0)
Platelets: 339 10*3/uL (ref 150–400)
RBC: 3.13 MIL/uL — ABNORMAL LOW (ref 4.22–5.81)
RDW: 18.6 % — ABNORMAL HIGH (ref 11.5–15.5)
WBC: 13.2 10*3/uL — ABNORMAL HIGH (ref 4.0–10.5)
nRBC: 0 % (ref 0.0–0.2)

## 2019-10-11 LAB — MAGNESIUM: Magnesium: 1.7 mg/dL (ref 1.7–2.4)

## 2019-10-11 MED ORDER — CHLORHEXIDINE GLUCONATE CLOTH 2 % EX PADS: 6.0000 | MEDICATED_PAD | Freq: Every day | CUTANEOUS | Status: DC

## 2019-10-11 MED ORDER — LEVOFLOXACIN 250 MG PO TABS
250.0000 mg | ORAL_TABLET | Freq: Every day | ORAL | Status: DC
  Administered 2019-10-12: 250 mg via ORAL
  Filled 2019-10-11 (×2): qty 1

## 2019-10-11 MED ORDER — LEVOFLOXACIN 750 MG PO TABS
750.0000 mg | ORAL_TABLET | Freq: Once | ORAL | Status: AC
  Administered 2019-10-11: 750 mg via ORAL
  Filled 2019-10-11: qty 1

## 2019-10-11 MED ORDER — VANCOMYCIN HCL 2000 MG/400ML IV SOLN
2000.0000 mg | Freq: Once | INTRAVENOUS | Status: AC
  Administered 2019-10-11: 2000 mg via INTRAVENOUS
  Filled 2019-10-11: qty 400

## 2019-10-11 MED ORDER — LEVOFLOXACIN 250 MG PO TABS: 250.0000 mg | ORAL_TABLET | Freq: Every day | ORAL | Status: DC

## 2019-10-11 MED ORDER — CALCIUM GLUCONATE-NACL 1-0.675 GM/50ML-% IV SOLN
1.0000 g | Freq: Once | INTRAVENOUS | Status: AC
  Administered 2019-10-11: 1000 mg via INTRAVENOUS
  Filled 2019-10-11: qty 50

## 2019-10-11 MED ORDER — VANCOMYCIN HCL IN DEXTROSE 1-5 GM/200ML-% IV SOLN
1000.0000 mg | INTRAVENOUS | Status: DC
  Administered 2019-10-12: 1000 mg via INTRAVENOUS
  Filled 2019-10-11: qty 200

## 2019-10-11 NOTE — Progress Notes (Signed)
[facility] Progress Note   Assessment/ Plan:   OP HD:MWF [facility] 4h 400/600 RIJ TDC 90kg Hep none - epo 2400 tiw - Fe 50mg / wk  Assessment/ Plan: 1. Syncope/ resp arrest - no further episodes    2. Refractory PD cath peritonitis - TNC 5314, 94% pmn's. WBC 30k--> 13.3 with treatment.  Vanc/ ceftaz 6/19--> changed to zosyn 6/22 evening.  There was some question if PD cath was exposed in wound, appreciate gen surg input. Initial plan to treat, allow midline wound to heal more before PD cath removal. Cultures from 6/19 growing enterococcus and Serratia, sensitivities reviewed, cultures 6/22 growing serratia and cell counts consistent with refractory PD peritonitis.   - Discussed with ID per previous nephrology attending --> would benefit from PD catheter removal in the setting of refractory PD cath peritonitis - per conversation with general surgery on 6/28 they would recommend waiting 6 weeks for PD catheter removal; they have asked for the patient to follow-up with them in clinic and to follow-up with the surgeon that placed the PD catheter - Will need PD catheter flushed weekly - states last flush last week - will d/w HD unit   3. PAD: s/p bypass grafting 6/24  4. ESRD - on HD using TDC for now.  - HD per MWF schedule   5. Hyperkalemia - resolved.   6. Abd wound - slow healing wound after SBO surgery x 2 during May hospital stay  7. COPD - per primary  8. Hypotension: on midodrine 10 TID with intra-dialysis 10 mg if needed.  9. Anemia ckd - gets epo at op HD - reordered for every Monday dosing with aranesp 100 mcg 6/28 onward for now.   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  Replete calcium x 1 on 6/29.  update phos on 6/30     Subjective:    Last HD on 6/28 with 2.5 kg UF.  States that his PD catheter was placed at [facility].   Review of systems:  States had nausea after HD - none now; denies vomiting   Reports some  shortness of breath - has COPD Denies chest pain   Objective:   BP 134/79 (BP Location: Right Arm)   Pulse 93   Temp 99.1 F (37.3 C) (Oral)   Resp 17   Ht 6\' 4"  (1.93 m)   Wt 90.3 kg   SpO2 94%   BMI 24.23 kg/m   Physical Exam: KYH:CWCBJ male seated in bed in NAD CVS:S1S2 no rub Resp: clear and unlabored at rest Abd: distended, soft, nontender, abd wound dressed, PD cath RLQ dressed Ext: 1+ ankle edema bilateral lower extremities ACCESS: PD cath intact, L IJ [facility] - alert and oriented x3 provides hx and follows commands Psych normal mood and affect   Labs: BMET Recent Labs  Lab 10/05/19 0630 10/05/19 0630 10/06/19 0222 10/06/19 1621 10/06/19 1932 10/07/19 0117 10/07/19 0343 10/08/19 0759 10/09/19 0333 10/10/19 0451 10/11/19 0404  NA 131*   < > 137   < > 136 135 135 135 135 134* 137  K 4.8   < > 4.0   < > 4.7 4.3 4.3 4.3 4.1 4.1 3.7  CL 97*   < > 107  --   --  100 101 99 100 102 101  CO2 21*   < > 25  --   --  23 24 23 22  18* 24  GLUCOSE 114*   < > 108*  --   --  102* 98 97 109* 87 84  BUN 37*   < > 20  --   --  30* 30* 25* 34* 44* 23  CREATININE 3.88*   < > 2.57*  --   --  3.32* 2.94* 2.80* 3.67* 4.46* 3.32*  CALCIUM 6.8*   < > 6.9*  --   --  6.5* 6.5* 7.2* 6.8* 6.4* 6.8*  PHOS 3.2  --  2.5  --   --   --  4.9*  --   --   --   --    < > = values in this interval not displayed.   CBC Recent Labs  Lab 10/08/19 0759 10/09/19 0333 10/10/19 0451 10/11/19 0404  WBC 13.3* 15.5* 14.8* 13.2*  HGB 8.1* 8.3* 8.5* 9.3*  HCT 26.1* 26.6* 27.9* 30.0*  MCV 93.5 93.3 95.5 95.8  PLT 259 317 340 339      Medications:    . apixaban  5 mg Oral BID  . atorvastatin  80 mg Oral Daily  . Chlorhexidine Gluconate Cloth  6 each Topical Q0600  . Chlorhexidine Gluconate Cloth  6 each Topical Q0600  . clopidogrel  75 mg Oral Daily  . darbepoetin (ARANESP) injection - NON-DIALYSIS  100 mcg Subcutaneous Q Mon-1800  . diltiazem  180 mg Oral Daily  . docusate sodium  100  mg Oral Daily  . gabapentin  300 mg Oral QHS  . gentamicin cream  1 application Topical Daily  . midodrine  10 mg Oral TID WC     Claudia Desanctis, MD 10/11/2019, 6:31 AM

## 2019-10-11 NOTE — Progress Notes (Signed)
Pharmacy Antibiotic Note  Aaron [name] is a 64 y.o. male admitted on 10/01/2019 with polymicrobial peritonitis. Pharmacy has been consulted for vancomycin dosing. Aaron [name] has grown enterococcus and serratia out of his peritoneal fluid and was previously treated with Zosyn. Now switching to Vanc + Levaquin to ease administration and spare another line. Last dose of vancomycin was 6/21 and patient has had HD three times since last dose so likely all eliminated. Now on HD MWF.   Plan: Vancomycin 2 gm x 1 then 1000 mg every MWF with HD Monitor HD sessions Levaquin 750 mg x1 then 250 mg every 24 hours in the evening  Height: 6\' 4"  (193 cm) Weight: 90.3 kg (199 lb 1.2 oz) IBW/kg (Calculated) : 86.8  Temp (24hrs), Avg:98.4 F (36.9 C), Min:97.3 F (36.3 C), Max:99.1 F (37.3 C)  Recent Labs  Lab 10/07/19 0343 10/08/19 0759 10/09/19 0333 10/10/19 0451 10/11/19 0404  WBC 9.6 13.3* 15.5* 14.8* 13.2*  CREATININE 2.94* 2.80* 3.67* 4.46* 3.32*    Estimated Creatinine Clearance: 28 mL/min (A) (by C-G formula based on SCr of 3.32 mg/dL (H)).    No Known Allergies    Thank you for allowing pharmacy to be a part of this patient's care.  Jimmy Footman, PharmD, BCPS, BCIDP Infectious Diseases Clinical Pharmacist Phone: [phone] 10/11/2019 8:53 AM

## 2019-10-11 NOTE — Progress Notes (Signed)
PROGRESS NOTE  Aaron [name] ZTI:[id number] DOB: 09-Sep-1955 DOA: 10/01/2019 PCP: Gwenlyn Saran [place]   LOS: 10 days   Brief narrative: 64 y.o. Male with PMH of ESRD on peritoneal dialysis, history of  permanent Atrial fibrillation on Eliquis who was recently admitted for small bowel obstruction  status post exploratory laparatomy with retention sutures was at dialysis on 6/18 when he became dizzy and had syncopal episode and respiratory arrest.  He was then sent to [facility] emergency department with he was noted to have elevated lactate at 6.6.  He was then transferred to [facility] when en route developed atrial fibrillation with rapid ventricular response and was given Cardizem iv which dropped his blood pressure.  He was then started on Neo-Synephrine and was admitted to the ICU.  Patient was subsequently stable and was transferred out of the ICU. His current problems include peritonitis with peritoneal fluid cultures that are showing Enterococcus and Serratia.  Still has the PD catheter but on hemodialysis.  On antibiotics per infectious disease.   Subjective: 10/06/2019 he underwent -left common femoral endarterectomy angioplasty SFA and profunda femoris artery Stent to left common iliac artery, left external iliac artery, left lower extremity angiography, aortogram, right lower extremity angiography, stent to right SFA, left common femoral below-knee popliteal artery bypass.  -Currently on antibiotics for peritonitis.  Patient seen and examined. He denies having any complaints at this time.   Assessment/Plan:  Principal Problem:   Peritonitis ([place]) Active Problems:   Septic shock (HCC)   Shortness of breath   Atrial fibrillation (HCC)   S/P thoracentesis   Ischemic ulcer of toe of right foot (HCC)   Bilateral lower extremity ischemia/edema, pain  10/06/2019 status post -left common femoral endarterectomy angioplasty SFA and profunda femoris  artery Stent to left common iliac artery, left external iliac artery, left lower extremity angiography, aortogram, right lower extremity angiography, stent to right SFA, left common femoral below-knee popliteal artery bypass -Stable-reporting improved pain, swelling in the lower extremities resolved. -Positive pulses in the lower extremities bilaterally -Vascular team following, heparin DC'd -Transitioned to Eliquis.    -History of iliofemoral endarterectomy. -underwent CT angiogram with left external iliac artery and common femoral artery occlusion and left lower extremity ischemia and left great toe ulceration.    Syncopal Episode with hypotension BP stable, no further episodes Possibly secondary to peritonitis, volume depletion.  Currently off pressors. On midodrine trial.  2D echocardiogram on 05/27/2019 was 50 to 55%.  Atrial Fibrillation/RVR On presentation seen by cardiology.  Continue current medications as per cardiology.  Cardiology recommending outpatient follow-up with elective cardioversion after minimum of 3 weeks of anticoagulation if needed.  Follow-up recommended with Dr. Denman George, [facility].  Cardiology has signed off at this time.  Blood pressure has improved at this time.   Lactic acidosis, recent SBO s/p ex lap bowel resection , resolving. PD cath peritonitis, septic shock  Peritoneal fluid cultures showing Enterococcus and Proteus.    On Zosyn as per ID.  -WBC 13.2 today. -Ideal to remove the PD cath in the setting of PD cath peritonitis. -Nephrology and infectious disease following.  Nephrology in discussion with surgery. -Per general surgery recommend waiting 6 weeks for PD catheter removal.  Patient to follow-up with them in clinic and to follow-up with the surgeon that placed the PD catheter.  ESRD -Was on peritoneal dialysis, now on HD through left internal jugular dialysis catheter M/W/F.   Continue hemodialysis as per nephrology.  Left-sided pleural  effusion,  dyspnea on exertion dyspnea. Remained stable,  Pulmonary gave short course of prednisone.   Status post left-sided thoracocentesis on 10/03/2019 with removal of 1200 mL of straw-colored fluid.  Appeared to be transudative at this time. Appears comfortable  Debility, deconditioning.  Physical therapy has been consulted.  Recommend Home health PT on discharge.  DVT prophylaxis: SCD's Start: 10/06/19 2256 SCDs Start: 10/01/19 0607 Code Status: Full code  Family Communication: No family at bedside  Status is: Inpatient  Dispo: The patient is from: Home              Anticipated d/c is to: Likely home with home health.                Anticipated d/c date is: 1-2 days              Patient currently is not medically stable to d/c.  Consultants:  PCCM   general surgery   Nephrology  Vascular surgery  ID   Procedures:  Hemodialysis   Thoracocentesis left on 10/03/19  Antibiotics:  . Ceftazidime, vancomycin iv 6/20>6/22 . Zosyn 6/22>   Objective: Vitals:   10/11/19 0409 10/11/19 0809  BP: 134/79 116/74  Pulse: 93 91  Resp: 17 16  Temp: 99.1 F (37.3 C) 98.4 F (36.9 C)  SpO2: 94% 92%    Intake/Output Summary (Last 24 hours) at 10/11/2019 0919 Last data filed at 10/11/2019 0818 Gross per 24 hour  Intake 1501.27 ml  Output 3300 ml  Net -1798.73 ml   Filed Weights   10/10/19 1525 10/10/19 1900 10/11/19 0424  Weight: 93.4 kg 90.9 kg 90.3 kg   Body mass index is 24.23 kg/m.    Physical Exam  Constitution: Frail appearing male, alert, cooperative, no distress,  Psychiatric: Normal and stable mood and affect, cognition intact,   HEENT: Normocephalic, PERRL, otherwise with in Normal limits  Chest:Chest symmetric Cardio vascular:  S1/S2, RRR pulmonary: Clear to auscultation bilaterally, no rhonchi or wheezing Abdomen: Soft, abdominal wound with dressing in place, PD cath right lower quadrant with dressing Muscular skeletal: able to move all 4  extremities, Normal strength  Neuro: CNII-XII intact. normal motor and sensation, reflexes intact  Extremities: Lower extremity edema improving,  dressing on feet Skin: Dry, warm to touch, no rashes    Data Review: I have personally reviewed the following laboratory data and studies,  CBC: Recent Labs  Lab 10/07/19 0343 10/08/19 0759 10/09/19 0333 10/10/19 0451 10/11/19 0404  WBC 9.6 13.3* 15.5* 14.8* 13.2*  HGB 8.5* 8.1* 8.3* 8.5* 9.3*  HCT 26.3* 26.1* 26.6* 27.9* 30.0*  MCV 91.0 93.5 93.3 95.5 95.8  PLT 237 259 317 340 081   Basic Metabolic Panel: Recent Labs  Lab 10/05/19 0630 10/05/19 0630 10/06/19 0222 10/06/19 1621 10/07/19 0343 10/08/19 0759 10/09/19 0333 10/10/19 0451 10/11/19 0404  NA 131*   < > 137   < > 135 135 135 134* 137  K 4.8   < > 4.0   < > 4.3 4.3 4.1 4.1 3.7  CL 97*   < > 107   < > 101 99 100 102 101  CO2 21*   < > 25   < > 24 23 22  18* 24  GLUCOSE 114*   < > 108*   < > 98 97 109* 87 84  BUN 37*   < > 20   < > 30* 25* 34* 44* 23  CREATININE 3.88*   < > 2.57*   < > 2.94* 2.80*  3.67* 4.46* 3.32*  CALCIUM 6.8*   < > 6.9*   < > 6.5* 7.2* 6.8* 6.4* 6.8*  MG 2.2  --  1.9  --  1.9  --   --   --  1.7  PHOS 3.2  --  2.5  --  4.9*  --   --   --   --    < > = values in this interval not displayed.   Liver Function Tests: Recent Labs  Lab 10/05/19 0630 10/06/19 0222 10/10/19 0451  AST 27 28 23   ALT 16 16 15   ALKPHOS 242* 216* 153*  BILITOT 0.5 0.3 0.6  PROT 5.5* 5.4* 4.6*  ALBUMIN 1.5* 1.5* 1.5*   No results for input(s): LIPASE, AMYLASE in the last 168 hours. No results for input(s): AMMONIA in the last 168 hours. Cardiac Enzymes: No results for input(s): CKTOTAL, CKMB, CKMBINDEX, TROPONINI in the last 168 hours. BNP (last 3 results) Recent Labs    05/26/19 1542 08/22/19 1930 10/01/19 0643  BNP 322.8* 1,578.0* 3,857.9*    ProBNP (last 3 results) No results for input(s): PROBNP in the last 8760 hours.  CBG: Recent Labs  Lab  10/06/19 2258 10/07/19 0732 10/07/19 1119 10/08/19 0749 10/08/19 1148  GLUCAP 101* 118* 134* 97 110*   Recent Results (from the past 240 hour(s))  Body fluid culture     Status: None   Collection Time: 10/01/19  1:20 PM   Specimen: Body Fluid  Result Value Ref Range Status   Specimen Description FLUID PERITONEAL DIALYSIS  Final   Special Requests Immunocompromised  Final   Gram Stain   Final    RARE WBC PRESENT, PREDOMINANTLY PMN NO ORGANISMS SEEN Performed at [facility], [address], [place], [place] [address]    Culture   Final    FEW ENTEROCOCCUS FAECALIS FEW SERRATIA MARCESCENS    Report Status 10/04/2019 FINAL  Final   Organism ID, Bacteria ENTEROCOCCUS FAECALIS  Final   Organism ID, Bacteria SERRATIA MARCESCENS  Final      Susceptibility   Enterococcus faecalis - MIC*    AMPICILLIN <=2 SENSITIVE Sensitive     VANCOMYCIN 1 SENSITIVE Sensitive     GENTAMICIN SYNERGY RESISTANT Resistant     * FEW ENTEROCOCCUS FAECALIS   Serratia marcescens - MIC*    CEFAZOLIN >=64 RESISTANT Resistant     CEFEPIME <=0.12 SENSITIVE Sensitive     CEFTAZIDIME <=1 SENSITIVE Sensitive     CEFTRIAXONE <=0.25 SENSITIVE Sensitive     CIPROFLOXACIN <=0.25 SENSITIVE Sensitive     GENTAMICIN <=1 SENSITIVE Sensitive     TRIMETH/SULFA <=20 SENSITIVE Sensitive     * FEW SERRATIA MARCESCENS  Body fluid culture (includes gram stain)     Status: None   Collection Time: 10/03/19  2:25 PM   Specimen: Pleural Fluid  Result Value Ref Range Status   Specimen Description FLUID PLEURAL LEFT  Final   Special Requests NONE  Final   Gram Stain   Final    WBC PRESENT,BOTH PMN AND MONONUCLEAR NO ORGANISMS SEEN CYTOSPIN SMEAR    Culture   Final    NO GROWTH 3 DAYS Performed at [facility], [address] [place], [place] [address]    Report Status 10/06/2019 FINAL  Final  Body fluid culture     Status: None   Collection Time: 10/04/19 10:30 AM   Specimen: Peritoneal Dialysate; Body  Fluid  Result Value Ref Range Status   Specimen Description PERITONEAL DIALYSATE  Final   Special Requests NONE  Final   Gram Stain   Final    ABUNDANT WBC PRESENT,BOTH PMN AND MONONUCLEAR NO ORGANISMS SEEN    Culture   Final    MODERATE SERRATIA MARCESCENS FEW ENTEROCOCCUS FAECALIS CRITICAL RESULT CALLED TO, READ BACK BY AND VERIFIED WITH: RN Howard Pouch [phone] AT 1038 BY CM Performed at [facility], [place] [address], [place], [place] [address]    Report Status 10/08/2019 FINAL  Final   Organism ID, Bacteria SERRATIA MARCESCENS  Final   Organism ID, Bacteria ENTEROCOCCUS FAECALIS  Final      Susceptibility   Enterococcus faecalis - MIC*    AMPICILLIN <=2 SENSITIVE Sensitive     VANCOMYCIN 1 SENSITIVE Sensitive     GENTAMICIN SYNERGY RESISTANT Resistant     * FEW ENTEROCOCCUS FAECALIS   Serratia marcescens - MIC*    CEFAZOLIN >=64 RESISTANT Resistant     CEFEPIME 0.5 SENSITIVE Sensitive     CEFTAZIDIME <=1 SENSITIVE Sensitive     CEFTRIAXONE 0.5 SENSITIVE Sensitive     CIPROFLOXACIN <=0.25 SENSITIVE Sensitive     GENTAMICIN <=1 SENSITIVE Sensitive     TRIMETH/SULFA <=20 SENSITIVE Sensitive     * MODERATE SERRATIA MARCESCENS  Surgical pcr screen     Status: None   Collection Time: 10/05/19  9:53 PM   Specimen: Nasal Mucosa; Nasal Swab  Result Value Ref Range Status   MRSA, PCR NEGATIVE NEGATIVE Final   Staphylococcus aureus NEGATIVE NEGATIVE Final    Comment: (NOTE) The Xpert SA Assay (FDA approved for NASAL specimens in patients 52 years of age and older), is one component of a comprehensive surveillance program. It is not intended to diagnose infection nor to guide or monitor treatment. Performed at [facility], [place] [address], [place], [place] [address]      Studies: VAS [place] LOWER EXTREMITY SAPHENOUS VEIN MAPPING  Result Date: 10/05/2019 LOWER EXTREMITY VEIN MAPPING Indications:       pre op Other Indications: PAD Risk Factors:      Hypertension,  hyperlipidemia, coronary artery disease.  Comparison Study: no prior Performing Technologist: Abram Sander RVS  Examination Guidelines: A complete evaluation includes B-mode imaging, spectral Doppler, color Doppler, and power Doppler as needed of all accessible portions of each vessel. Bilateral testing is considered an integral part of a complete examination. Limited examinations for reoccurring indications may be performed as noted. Diagnosing physician: Curt Jews MD Electronically signed by Curt Jews MD on 10/05/2019 at 3:42:57 PM.    Final    HYBRID OR IMAGING ([place])  Result Date: 10/06/2019 There is no interpretation for this exam.  This order is for images obtained during a surgical procedure.  Please See "Surgeries" Tab for more information regarding the procedure.    Yaakov Guthrie, MD  [facility] Pager on [facility] 10/11/2019

## 2019-10-11 NOTE — Progress Notes (Signed)
PT Cancellation Note  Patient Details Name: Aaron [name] MRN: [id number] DOB: Dec 02, 1955   Cancelled Treatment:    Reason Eval/Treat Not Completed: Other (comment) (refused stating his pain meds havent "kicked in")   Sharice Harriss F Heavenly Christine 10/11/2019, 11:13 AM  Lindia Garms W,PT Acute Rehabilitation Services Pager:  [phone]  Office:  [phone]

## 2019-10-11 NOTE — Progress Notes (Addendum)
[facility]  Subjective  - Still soreness in the left LE, but slowly improving.   Objective 134/79 93 99.1 F (37.3 C) (Oral) 17 94%  Intake/Output Summary (Last 24 hours) at 10/11/2019 0741 Last data filed at 10/11/2019 0007 Gross per 24 hour  Intake 1741.27 ml  Output 3200 ml  Net -1458.73 ml    Palpable brisk DP B pulses Left LE incisions healing well, soft groin with 4 x 4 in place. Right groin soft B GT dry eschar left open to air Lungs non labored breathing  Assessment/Planning: POD # 5  64 y.o. male is s/p left femoral to popliteal bypass  Patent arterial blood flow with palpable pulses B. Eliquis restarted Will cont. 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 F/U in 2-3 weeks with Dr. Fuller Plan 10/11/2019 7:41 AM --  Laboratory Lab Results: Recent Labs    10/10/19 0451 10/11/19 0404  WBC 14.8* 13.2*  HGB 8.5* 9.3*  HCT 27.9* 30.0*  PLT 340 339   BMET Recent Labs    10/10/19 0451 10/11/19 0404  NA 134* 137  K 4.1 3.7  CL 102 101  CO2 18* 24  GLUCOSE 87 84  BUN 44* 23  CREATININE 4.46* 3.32*  CALCIUM 6.4* 6.8*    COAG Lab Results  Component Value Date   INR 1.2 10/06/2019   INR 1.6 (H) 10/01/2019   INR 1.0 05/26/2019   No results found for: PTT  I have independently interviewed and examined patient and agree with PA assessment and plan above.   Rande Dario C. Donzetta Matters, MD [facility] [place] Office: [phone] Pager: [phone]

## 2019-10-11 NOTE — Progress Notes (Signed)
Patient ID: Aaron [name], male   DOB: 02/26/1956, 64 y.o.   MRN: [id number]         [facility] for Infectious Disease  Date of Admission:  10/01/2019   Total days of antibiotics 10        Day 8 piperacillin tazobactam         ASSESSMENT: I will change him to IV vancomycin plus oral levofloxacin and plan on at least 2 more weeks of therapy for his polymicrobial peritonitis.  PLAN: 1. Change to IV vancomycin and oral levofloxacin renally dosed 2. Range follow-up with me in 2 weeks and will sign off now  Diagnosis: Peritonitis  Culture Result: Enterococcus and Serratia  No Known Allergies  OPAT Orders Discharge antibiotics to be given via PICC line Discharge antibiotics: Per pharmacy protocol vancomycin and oral levofloxacin Aim for Vancomycin trough 15-20 or AUC 400-550 (unless otherwise indicated) Duration: 2 weeks End Date: 10/25/2019  [facility] Care Per Protocol:  Home health RN for IV administration and teaching; PICC line care and labs.    Labs weekly while on IV antibiotics: _x_ CBC with differential _x_ BMP __ CMP __ CRP __ ESR _x_ Vancomycin trough __ CK  _NA_ Please pull PIC at completion of IV antibiotics __ Please leave PIC in place until doctor has seen patient or been notified  Fax weekly labs to [phone]  Clinic Follow Up Appt: 10/24/2019  Principal Problem:   Peritonitis ([place]) Active Problems:   Septic shock ([place])   Shortness of breath   Atrial fibrillation (HCC)   S/P thoracentesis   Ischemic ulcer of toe of right foot (HCC)   Scheduled Meds:  apixaban  5 mg Oral BID   atorvastatin  80 mg Oral Daily   Chlorhexidine Gluconate Cloth  6 each Topical Q0600   Chlorhexidine Gluconate Cloth  6 each Topical Q0600   clopidogrel  75 mg Oral Daily   darbepoetin (ARANESP) injection - NON-DIALYSIS  100 mcg Subcutaneous Q Mon-1800   diltiazem  180 mg Oral Daily   docusate sodium  100 mg Oral Daily   gabapentin  300 mg Oral QHS    gentamicin cream  1 application Topical Daily   [START ON 10/12/2019] levofloxacin  250 mg Oral q1800   levofloxacin  750 mg Oral Once   midodrine  10 mg Oral TID WC   Continuous Infusions:  sodium chloride     sodium chloride 250 mL (10/10/19 2114)   magnesium sulfate bolus IVPB     [START ON 10/12/2019] vancomycin     vancomycin     PRN Meds:.sodium chloride, sodium chloride, acetaminophen **OR** acetaminophen, albuterol, camphor-menthol, docusate sodium, fluticasone furoate-vilanterol, guaiFENesin-dextromethorphan, magnesium sulfate bolus IVPB, metoprolol tartrate, midodrine, oxyCODONE-acetaminophen, phenol, polyethylene glycol, promethazine   SUBJECTIVE: He asked me why he has to go back to his doctor at [facility] to have his peritoneal dialysis catheter removed.  He tells me that that will be a big "hassle" for him.  He is eager to go home.  He is not having any abdominal pain.  Review of Systems: Review of Systems  Constitutional: Negative for fever.  Gastrointestinal: Negative for abdominal pain, nausea and vomiting.    No Known Allergies  OBJECTIVE: Vitals:   10/10/19 2033 10/11/19 0409 10/11/19 0424 10/11/19 0809  BP: (!) 157/99 134/79  116/74  Pulse: 100 93  91  Resp: _0 Temp: 98.6 F (37 C) 99.1 F (37.3 C)  98.4 F (36.9 C)  TempSrc:  Oral Oral  Oral  SpO2:  94%  92%  Weight:   90.3 kg   Height:       Body mass index is 24.23 kg/m.  Physical Exam Abdominal:     Palpations: Abdomen is soft.     Tenderness: There is no abdominal tenderness.     Comments: His right lower quadrant dialysis catheter is in place.  His midline wound is dressed.     Lab Results Lab Results  Component Value Date   WBC 13.2 (H) 10/11/2019   HGB 9.3 (L) 10/11/2019   HCT 30.0 (L) 10/11/2019   MCV 95.8 10/11/2019   PLT 339 10/11/2019    Lab Results  Component Value Date   CREATININE 3.32 (H) 10/11/2019   BUN 23 10/11/2019   NA 137 10/11/2019   K 3.7  10/11/2019   CL 101 10/11/2019   CO2 24 10/11/2019    Lab Results  Component Value Date   ALT 15 10/10/2019   AST 23 10/10/2019   ALKPHOS 153 (H) 10/10/2019   BILITOT 0.6 10/10/2019     Microbiology: Recent Results (from the past 240 hour(s))  Body fluid culture     Status: None   Collection Time: 10/01/19  1:20 PM   Specimen: Body Fluid  Result Value Ref Range Status   Specimen Description FLUID PERITONEAL DIALYSIS  Final   Special Requests Immunocompromised  Final   Gram Stain   Final    RARE WBC PRESENT, PREDOMINANTLY PMN NO ORGANISMS SEEN Performed at [facility], [address], [place], [place] [address]    Culture   Final    FEW ENTEROCOCCUS FAECALIS FEW SERRATIA MARCESCENS    Report Status 10/04/2019 FINAL  Final   Organism ID, Bacteria ENTEROCOCCUS FAECALIS  Final   Organism ID, Bacteria SERRATIA MARCESCENS  Final      Susceptibility   Enterococcus faecalis - MIC*    AMPICILLIN <=2 SENSITIVE Sensitive     VANCOMYCIN 1 SENSITIVE Sensitive     GENTAMICIN SYNERGY RESISTANT Resistant     * FEW ENTEROCOCCUS FAECALIS   Serratia marcescens - MIC*    CEFAZOLIN >=64 RESISTANT Resistant     CEFEPIME <=0.12 SENSITIVE Sensitive     CEFTAZIDIME <=1 SENSITIVE Sensitive     CEFTRIAXONE <=0.25 SENSITIVE Sensitive     CIPROFLOXACIN <=0.25 SENSITIVE Sensitive     GENTAMICIN <=1 SENSITIVE Sensitive     TRIMETH/SULFA <=20 SENSITIVE Sensitive     * FEW SERRATIA MARCESCENS  Body fluid culture (includes gram stain)     Status: None   Collection Time: 10/03/19  2:25 PM   Specimen: Pleural Fluid  Result Value Ref Range Status   Specimen Description FLUID PLEURAL LEFT  Final   Special Requests NONE  Final   Gram Stain   Final    WBC PRESENT,BOTH PMN AND MONONUCLEAR NO ORGANISMS SEEN CYTOSPIN SMEAR    Culture   Final    NO GROWTH 3 DAYS Performed at [facility], [address] [place], [place] [address]    Report Status 10/06/2019 FINAL  Final  Body fluid  culture     Status: None   Collection Time: 10/04/19 10:30 AM   Specimen: Peritoneal Dialysate; Body Fluid  Result Value Ref Range Status   Specimen Description PERITONEAL DIALYSATE  Final   Special Requests NONE  Final   Gram Stain   Final    ABUNDANT WBC PRESENT,BOTH PMN AND MONONUCLEAR NO ORGANISMS SEEN    Culture  Final    MODERATE SERRATIA MARCESCENS FEW ENTEROCOCCUS FAECALIS CRITICAL RESULT CALLED TO, READ BACK BY AND VERIFIED WITH: RN Howard Pouch [phone] AT 1038 BY CM Performed at [facility], [place] [address] [place], [place] [address]    Report Status 10/08/2019 FINAL  Final   Organism ID, Bacteria SERRATIA MARCESCENS  Final   Organism ID, Bacteria ENTEROCOCCUS FAECALIS  Final      Susceptibility   Enterococcus faecalis - MIC*    AMPICILLIN <=2 SENSITIVE Sensitive     VANCOMYCIN 1 SENSITIVE Sensitive     GENTAMICIN SYNERGY RESISTANT Resistant     * FEW ENTEROCOCCUS FAECALIS   Serratia marcescens - MIC*    CEFAZOLIN >=64 RESISTANT Resistant     CEFEPIME 0.5 SENSITIVE Sensitive     CEFTAZIDIME <=1 SENSITIVE Sensitive     CEFTRIAXONE 0.5 SENSITIVE Sensitive     CIPROFLOXACIN <=0.25 SENSITIVE Sensitive     GENTAMICIN <=1 SENSITIVE Sensitive     TRIMETH/SULFA <=20 SENSITIVE Sensitive     * MODERATE SERRATIA MARCESCENS  Surgical pcr screen     Status: None   Collection Time: 10/05/19  9:53 PM   Specimen: Nasal Mucosa; Nasal Swab  Result Value Ref Range Status   MRSA, PCR NEGATIVE NEGATIVE Final   Staphylococcus aureus NEGATIVE NEGATIVE Final    Comment: (NOTE) The Xpert SA Assay (FDA approved for NASAL specimens in patients 71 years of age and older), is one component of a comprehensive surveillance program. It is not intended to diagnose infection nor to guide or monitor treatment. Performed at [facility], [place] [address], [place], [place] [address]     Michel Bickers, [place] [facility] [place] [facility] [phone]  pager   [phone] cell 10/11/2019, 10:33 AM

## 2019-10-12 LAB — BASIC METABOLIC PANEL
Anion gap: 10 (ref 5–15)
BUN: 31 mg/dL — ABNORMAL HIGH (ref 8–23)
CO2: 24 mmol/L (ref 22–32)
Calcium: 6.7 mg/dL — ABNORMAL LOW (ref 8.9–10.3)
Chloride: 101 mmol/L (ref 98–111)
Creatinine, Ser: 4.23 mg/dL — ABNORMAL HIGH (ref 0.61–1.24)
GFR calc Af Amer: 16 mL/min — ABNORMAL LOW (ref >60)
GFR calc non Af Amer: 14 mL/min — ABNORMAL LOW (ref >60)
Glucose, Bld: 97 mg/dL (ref 70–99)
Potassium: 3.4 mmol/L — ABNORMAL LOW (ref 3.5–5.1)
Sodium: 135 mmol/L (ref 135–145)

## 2019-10-12 LAB — CBC
HCT: 25.6 % — ABNORMAL LOW (ref 39.0–52.0)
Hemoglobin: 7.8 g/dL — ABNORMAL LOW (ref 13.0–17.0)
MCH: 29.7 pg (ref 26.0–34.0)
MCHC: 30.5 g/dL (ref 30.0–36.0)
MCV: 97.3 fL (ref 80.0–100.0)
Platelets: 343 10*3/uL (ref 150–400)
RBC: 2.63 MIL/uL — ABNORMAL LOW (ref 4.22–5.81)
RDW: 18.9 % — ABNORMAL HIGH (ref 11.5–15.5)
WBC: 13 10*3/uL — ABNORMAL HIGH (ref 4.0–10.5)
nRBC: 0 % (ref 0.0–0.2)

## 2019-10-12 LAB — PHOSPHORUS: Phosphorus: 4.2 mg/dL (ref 2.5–4.6)

## 2019-10-12 MED ORDER — MIDODRINE HCL 5 MG PO TABS
ORAL_TABLET | ORAL | Status: AC
  Filled 2019-10-12: qty 2

## 2019-10-12 MED ORDER — OXYCODONE-ACETAMINOPHEN 5-325 MG PO TABS
ORAL_TABLET | ORAL | Status: AC
  Administered 2019-10-12: 1.5 via ORAL
  Filled 2019-10-12: qty 2

## 2019-10-12 MED ORDER — HEPARIN SODIUM (PORCINE) 1000 UNIT/ML IJ SOLN
INTRAMUSCULAR | Status: AC
  Filled 2019-10-12: qty 5

## 2019-10-12 MED ORDER — CALCIUM GLUCONATE-NACL 1-0.675 GM/50ML-% IV SOLN
1.0000 g | Freq: Once | INTRAVENOUS | Status: AC
  Administered 2019-10-12: 1000 mg via INTRAVENOUS
  Filled 2019-10-12: qty 50

## 2019-10-12 MED ORDER — ALUM & MAG HYDROXIDE-SIMETH 200-200-20 MG/5ML PO SUSP
30.0000 mL | Freq: Once | ORAL | Status: AC
  Administered 2019-10-12: 30 mL via ORAL
  Filled 2019-10-12: qty 30

## 2019-10-12 NOTE — Progress Notes (Signed)
PROGRESS NOTE    Aaron [name]  QMV:[id number] DOB: 25-Apr-1955 DOA: 10/01/2019 PCP: Gwenlyn Saran [facility]   Brief Narrative:64 y.o. Male with PMH of ESRD on peritoneal dialysis, history of  permanent Atrial fibrillation on Eliquis who was recently admitted for small bowel obstruction  status post exploratory laparatomy with retention sutures was at dialysis on 6/18 when he became dizzy and had syncopal episode and respiratory arrest.  He was then sent to [facility] emergency department with he was noted to have elevated lactate at 6.6.  He was then transferred to [facility] when en route developed atrial fibrillation with rapid ventricular response and was given Cardizem iv which dropped his blood pressure.  He was then started on Neo-Synephrine and was admitted to the ICU.  Patient was subsequently stable and was transferred out of the ICU. His current problems include peritonitis with peritoneal fluid cultures that are showing Enterococcus and Serratia.  Still has the PD catheter but on hemodialysis.  On antibiotics per infectious disease.  Assessment & Plan:   Principal Problem:   Peritonitis ([place]) Active Problems:   Septic shock (HCC)   Shortness of breath   Atrial fibrillation (HCC)   S/P thoracentesis   Ischemic ulcer of toe of right foot ([place])   #1  ESRD patient was on peritoneal dialysis prior to admission.  No getting hemodialysis through left IJ dialysis catheter Monday Wednesday and Friday.  Will plan discharge home tomorrow.  #2  PD cath/peritonitis/septic shock status post exploratory laparotomy with bowel resection for recent small bowel obstruction-on Zosyn per infectious disease.  Plan to remove PD catheter as an outpatient in 6 weeks by interventional radiology.  #3 A. fib RVR followed by cardiology recommending elective cardioversion after 3 weeks of anticoagulation.  Follow-up with Dr. Alison Murray with [facility].  Cardiology signed off.  #4  bilateral lower extremity ischemia -followed by vascular.10/06/2019 status post -left common femoral endarterectomy angioplasty SFA and profunda femoris artery Stent to left common iliac artery, left external iliac artery, left lower extremity angiography, aortogram, right lower extremity angiography, stent to right SFA, left common femoral below-knee popliteal artery bypass Continue Eliquis. Follow-up with vascular upon discharge.  #5 acquired thrombophilia patient on Eliquis to prevent stroke  #6 left-sided pleural effusion status post thoracentesis 10/03/2019 with removal of 1200 cc of straw-colored fluid which appears to be transudate.  #7 significant deconditioning PT recommends home health physical therapy on discharge    Estimated body mass index is 24.07 kg/m as calculated from the following:   Height as of this encounter: 6\' 4"  (1.93 m).   Weight as of this encounter: 89.7 kg.  DVT prophylaxis: Eliquis  code Status: Full code  family Communication: None at bedside  disposition Plan:  Status is: Inpatient  Dispo:  Patient From: Home  Planned Disposition: Home with Health Care Svc  Expected discharge date: 7/1  Medically stable for discharge: No   Consultants: PCCM, nephrology, vascular surgery, ID, general surgery  Procedures: Hemodialysis catheter, left thoracentesis 10/03/2019 Antimicrobials:  Anti-infectives (From admission, onward)   Start     Dose/Rate Route Frequency Ordered Stop   10/12/19 1800  levofloxacin (LEVAQUIN) tablet 250 mg     Discontinue     250 mg Oral Daily-1800 10/11/19 0913     10/12/19 1200  vancomycin (VANCOCIN) IVPB 1000 mg/200 mL premix     Discontinue     1,000 mg 200 mL/hr over 60 Minutes Intravenous Every M-W-F (Hemodialysis) 10/11/19 0910 10/24/19 2359  10/11/19 1800  levofloxacin (LEVAQUIN) tablet 250 mg  Status:  Discontinued        250 mg Oral Daily-1800 10/11/19 0908 10/11/19 0913   10/11/19 1300  levofloxacin (LEVAQUIN) tablet 750 mg         750 mg Oral  Once 10/11/19 0908 10/11/19 1204   10/11/19 1200  vancomycin (VANCOREADY) IVPB 2000 mg/400 mL        2,000 mg 200 mL/hr over 120 Minutes Intravenous  Once 10/11/19 0908 10/11/19 1404   10/04/19 2000  piperacillin-tazobactam (ZOSYN) IVPB 2.25 g  Status:  Discontinued        2.25 g 100 mL/hr over 30 Minutes Intravenous Every 8 hours 10/04/19 1537 10/11/19 0845   10/03/19 2000  cefTAZidime (FORTAZ) 1 g in sodium chloride 0.9 % 100 mL IVPB  Status:  Discontinued        1 g 200 mL/hr over 30 Minutes Intravenous Every 24 hours 10/02/19 1439 10/04/19 1537   10/03/19 1200  vancomycin (VANCOCIN) IVPB 1000 mg/200 mL premix        1,000 mg 200 mL/hr over 60 Minutes Intravenous Every M-W-F (Hemodialysis) 10/03/19 0824 10/03/19 1637   10/02/19 1600  vancomycin (VANCOREADY) IVPB 2000 mg/400 mL        2,000 mg 200 mL/hr over 120 Minutes Intravenous  Once 10/02/19 1425 10/02/19 1922   10/02/19 1445  cefTAZidime (FORTAZ) 2 g in sodium chloride 0.9 % 100 mL IVPB        2 g 200 mL/hr over 30 Minutes Intravenous  Once 10/02/19 1439 10/02/19 1722   10/02/19 1428  vancomycin variable dose per unstable renal function (pharmacist dosing)  Status:  Discontinued         Does not apply See admin instructions 10/02/19 1429 10/04/19 1537        Subjective: Patient seen at dialysis anxious to go home  Objective: Vitals:   10/12/19 0930 10/12/19 1000 10/12/19 1030 10/12/19 1039  BP: 99/90 (!) 111/56 93/75 130/80  Pulse: (!) 113 76 66 99  Resp: 20 (!) 25 17 20   Temp:    97.6 F (36.4 C)  TempSrc:    Oral  SpO2:    96%  Weight:    89.7 kg  Height:        Intake/Output Summary (Last 24 hours) at 10/12/2019 1617 Last data filed at 10/12/2019 1039 Gross per 24 hour  Intake 360 ml  Output 2500 ml  Net -2140 ml   Filed Weights   10/12/19 0238 10/12/19 0652 10/12/19 1039  Weight: 91.7 kg 92.2 kg 89.7 kg    Examination:  General exam: Appears calm and comfortable  Respiratory  system: Clear to auscultation. Respiratory effort normal. Cardiovascular system: S1 & S2 heard, RRR. No JVD, murmurs, rubs, gallops or clicks. No pedal edema. Gastrointestinal system: Abdomen is nondistended, soft and nontender. No organomegaly or masses felt. Normal bowel sounds heard. Central nervous system: Alert and oriented. No focal neurological deficits. Extremities: 2+ left lower extremity edema much more than right lower extremity  skin: No rashes, lesions or ulcers Psychiatry: Judgement and insight appear normal. Mood & affect appropriate.     Data Reviewed: I have personally reviewed following labs and imaging studies  CBC: Recent Labs  Lab 10/08/19 0759 10/09/19 0333 10/10/19 0451 10/11/19 0404 10/12/19 0553  WBC 13.3* 15.5* 14.8* 13.2* 13.0*  HGB 8.1* 8.3* 8.5* 9.3* 7.8*  HCT 26.1* 26.6* 27.9* 30.0* 25.6*  MCV 93.5 93.3 95.5 95.8 97.3  PLT 259 317 340  339 497   Basic Metabolic Panel: Recent Labs  Lab 10/06/19 0222 10/06/19 1621 10/07/19 0343 10/07/19 0343 10/08/19 0759 10/09/19 0333 10/10/19 0451 10/11/19 0404 10/12/19 0553  NA 137   < > 135   < > 135 135 134* 137 135  K 4.0   < > 4.3   < > 4.3 4.1 4.1 3.7 3.4*  CL 107   < > 101   < > 99 100 102 101 101  CO2 25   < > 24   < > 23 22 18* 24 24  GLUCOSE 108*   < > 98   < > 97 109* 87 84 97  BUN 20   < > 30*   < > 25* 34* 44* 23 31*  CREATININE 2.57*   < > 2.94*   < > 2.80* 3.67* 4.46* 3.32* 4.23*  CALCIUM 6.9*   < > 6.5*   < > 7.2* 6.8* 6.4* 6.8* 6.7*  MG 1.9  --  1.9  --   --   --   --  1.7  --   PHOS 2.5  --  4.9*  --   --   --   --   --  4.2   < > = values in this interval not displayed.   GFR: Estimated Creatinine Clearance: 21.9 mL/min (A) (by C-G formula based on SCr of 4.23 mg/dL (H)). Liver Function Tests: Recent Labs  Lab 10/06/19 0222 10/10/19 0451  AST 28 23  ALT 16 15  ALKPHOS 216* 153*  BILITOT 0.3 0.6  PROT 5.4* 4.6*  ALBUMIN 1.5* 1.5*   No results for input(s): LIPASE, AMYLASE  in the last 168 hours. No results for input(s): AMMONIA in the last 168 hours. Coagulation Profile: Recent Labs  Lab 10/06/19 2347  INR 1.2   Cardiac Enzymes: No results for input(s): CKTOTAL, CKMB, CKMBINDEX, TROPONINI in the last 168 hours. BNP (last 3 results) No results for input(s): PROBNP in the last 8760 hours. HbA1C: No results for input(s): HGBA1C in the last 72 hours. CBG: Recent Labs  Lab 10/06/19 2258 10/07/19 0732 10/07/19 1119 10/08/19 0749 10/08/19 1148  GLUCAP 101* 118* 134* 97 110*   Lipid Profile: No results for input(s): CHOL, HDL, LDLCALC, TRIG, CHOLHDL, LDLDIRECT in the last 72 hours. Thyroid Function Tests: No results for input(s): TSH, T4TOTAL, FREET4, T3FREE, THYROIDAB in the last 72 hours. Anemia Panel: No results for input(s): VITAMINB12, FOLATE, FERRITIN, TIBC, IRON, RETICCTPCT in the last 72 hours. Sepsis Labs: No results for input(s): PROCALCITON, LATICACIDVEN in the last 168 hours.  Recent Results (from the past 240 hour(s))  Body fluid culture (includes gram stain)     Status: None   Collection Time: 10/03/19  2:25 PM   Specimen: Pleural Fluid  Result Value Ref Range Status   Specimen Description FLUID PLEURAL LEFT  Final   Special Requests NONE  Final   Gram Stain   Final    WBC PRESENT,BOTH PMN AND MONONUCLEAR NO ORGANISMS SEEN CYTOSPIN SMEAR    Culture   Final    NO GROWTH 3 DAYS Performed at [facility], [address], [place], [place] [address]    Report Status 10/06/2019 FINAL  Final  Body fluid culture     Status: None   Collection Time: 10/04/19 10:30 AM   Specimen: Peritoneal Dialysate; Body Fluid  Result Value Ref Range Status   Specimen Description PERITONEAL DIALYSATE  Final   Special Requests NONE  Final  Gram Stain   Final    ABUNDANT WBC PRESENT,BOTH PMN AND MONONUCLEAR NO ORGANISMS SEEN    Culture   Final    MODERATE SERRATIA MARCESCENS FEW ENTEROCOCCUS FAECALIS CRITICAL RESULT CALLED TO, READ BACK  BY AND VERIFIED WITH: RN Howard Pouch [phone] AT 1038 BY CM Performed at [facility], [place] [address] [place], [place] [address]    Report Status 10/08/2019 FINAL  Final   Organism ID, Bacteria SERRATIA MARCESCENS  Final   Organism ID, Bacteria ENTEROCOCCUS FAECALIS  Final      Susceptibility   Enterococcus faecalis - MIC*    AMPICILLIN <=2 SENSITIVE Sensitive     VANCOMYCIN 1 SENSITIVE Sensitive     GENTAMICIN SYNERGY RESISTANT Resistant     * FEW ENTEROCOCCUS FAECALIS   Serratia marcescens - MIC*    CEFAZOLIN >=64 RESISTANT Resistant     CEFEPIME 0.5 SENSITIVE Sensitive     CEFTAZIDIME <=1 SENSITIVE Sensitive     CEFTRIAXONE 0.5 SENSITIVE Sensitive     CIPROFLOXACIN <=0.25 SENSITIVE Sensitive     GENTAMICIN <=1 SENSITIVE Sensitive     TRIMETH/SULFA <=20 SENSITIVE Sensitive     * MODERATE SERRATIA MARCESCENS  Surgical pcr screen     Status: None   Collection Time: 10/05/19  9:53 PM   Specimen: Nasal Mucosa; Nasal Swab  Result Value Ref Range Status   MRSA, PCR NEGATIVE NEGATIVE Final   Staphylococcus aureus NEGATIVE NEGATIVE Final    Comment: (NOTE) The Xpert SA Assay (FDA approved for NASAL specimens in patients 75 years of age and older), is one component of a comprehensive surveillance program. It is not intended to diagnose infection nor to guide or monitor treatment. Performed at [facility], [place] [address], [place], [place] [address]          Radiology Studies: No results found.      Scheduled Meds: . apixaban  5 mg Oral BID  . atorvastatin  80 mg Oral Daily  . Chlorhexidine Gluconate Cloth  6 each Topical Q0600  . Chlorhexidine Gluconate Cloth  6 each Topical Q0600  . Chlorhexidine Gluconate Cloth  6 each Topical Q0600  . clopidogrel  75 mg Oral Daily  . darbepoetin (ARANESP) injection - NON-DIALYSIS  100 mcg Subcutaneous Q Mon-1800  . diltiazem  180 mg Oral Daily  . docusate sodium  100 mg Oral Daily  . gabapentin  300 mg Oral QHS  .  gentamicin cream  1 application Topical Daily  . levofloxacin  250 mg Oral q1800  . midodrine  10 mg Oral TID WC   Continuous Infusions: . sodium chloride    . sodium chloride 250 mL (10/10/19 2114)  . magnesium sulfate bolus IVPB    . vancomycin Stopped (10/12/19 1404)     LOS: 11 days     Georgette Shell, MD 10/12/2019, 4:17 PM

## 2019-10-12 NOTE — Progress Notes (Signed)
Pt stable. Pt is in bed. NO signs of disress noted. No needs or concerns at this time. Wil continue to monitor.

## 2019-10-12 NOTE — Progress Notes (Signed)
[facility] Progress Note   Assessment/ Plan:   OP HD:MWF [facility] 4h 400/600 RIJ TDC 90kg Hep none - epo 2400 tiw - Fe 50mg / wk  Assessment/ Plan: 1. Syncope/ resp arrest - no further episodes    2. Refractory PD cath peritonitis - TNC 5314, 94% pmn's. WBC 30k--> 13.3 with treatment.  Vanc/ ceftaz 6/19--> changed to zosyn 6/22 evening.  There was some question if PD cath was exposed in wound, appreciate gen surg input. Initial plan to treat, allow midline wound to heal more before PD cath removal. Cultures from 6/19 growing enterococcus and Serratia, sensitivities reviewed, cultures 6/22 growing serratia and cell counts consistent with refractory PD peritonitis.   - Discussed with ID per previous nephrology attending --> would benefit from PD catheter removal in the setting of refractory PD cath peritonitis - per conversation with general surgery on 6/28 they would recommend waiting 6 weeks for PD catheter removal; they have asked for the patient to follow-up with them in clinic and to follow-up with the surgeon that placed the PD catheter; pt states catheter was placed at [place] need PD catheter flushed weekly - states last flush last week - have ordered flush for 6/30 with HD RN   3. PAD: s/p bypass grafting 6/24  4. ESRD - on HD using TDC for now.  - HD per MWF schedule   5. Hyperkalemia - resolved.   6. Abd wound - slow healing wound after SBO surgery x 2 during May hospital stay  7. COPD - per primary  8. Hypotension: on midodrine 10 TID with intra-dialysis 10 mg if needed.  9. Anemia ckd - gets epo at op HD - reordered for every Monday dosing with aranesp 100 mcg 6/28 onward for now.   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  Ordered updated phos for 6/30; update intact PTH   Subjective:    Blood just drawn this AM.  Feels ok today. Last HD on 6/28 with 2.5 kg UF.  Review of systems:  States had nausea last night  - resolved now; denies vomiting   Denies any shortness of breath  Denies chest pain   Objective:   BP 115/89 (BP Location: Right Arm)   Pulse 98   Temp 98 F (36.7 C) (Oral)   Resp 17   Ht 6\' 4"  (1.93 m)   Wt 91.7 kg   SpO2 99%   BMI 24.61 kg/m   Physical Exam:  DGL:OVFIE male seated in bed in NAD CVS:S1S2 no rub Resp: clear and unlabored at rest Abd: distended, soft, nontender, abd wound dressed, PD cath RLQ dressed Ext: 1-2+ ankle edema LLE; none RLE ACCESS: PD cath intact, L IJ [facility] Neuro - alert and oriented x3 provides hx and follows commands Psych normal mood and affect   Labs: BMET Recent Labs  Lab 10/05/19 0630 10/05/19 0630 10/06/19 0222 10/06/19 1621 10/06/19 1932 10/07/19 0117 10/07/19 0343 10/08/19 0759 10/09/19 0333 10/10/19 0451 10/11/19 0404  NA 131*   < > 137   < > 136 135 135 135 135 134* 137  K 4.8   < > 4.0   < > 4.7 4.3 4.3 4.3 4.1 4.1 3.7  CL 97*   < > 107  --   --  100 101 99 100 102 101  CO2 21*   < > 25  --   --  23 24 23 22  18* 24  GLUCOSE 114*   < >  108*  --   --  102* 98 97 109* 87 84  BUN 37*   < > 20  --   --  30* 30* 25* 34* 44* 23  CREATININE 3.88*   < > 2.57*  --   --  3.32* 2.94* 2.80* 3.67* 4.46* 3.32*  CALCIUM 6.8*   < > 6.9*  --   --  6.5* 6.5* 7.2* 6.8* 6.4* 6.8*  PHOS 3.2  --  2.5  --   --   --  4.9*  --   --   --   --    < > = values in this interval not displayed.   CBC Recent Labs  Lab 10/08/19 0759 10/09/19 0333 10/10/19 0451 10/11/19 0404  WBC 13.3* 15.5* 14.8* 13.2*  HGB 8.1* 8.3* 8.5* 9.3*  HCT 26.1* 26.6* 27.9* 30.0*  MCV 93.5 93.3 95.5 95.8  PLT 259 317 340 339      Medications:    . apixaban  5 mg Oral BID  . atorvastatin  80 mg Oral Daily  . Chlorhexidine Gluconate Cloth  6 each Topical Q0600  . Chlorhexidine Gluconate Cloth  6 each Topical Q0600  . Chlorhexidine Gluconate Cloth  6 each Topical Q0600  . clopidogrel  75 mg Oral Daily  . darbepoetin (ARANESP) injection - NON-DIALYSIS  100 mcg  Subcutaneous Q Mon-1800  . diltiazem  180 mg Oral Daily  . docusate sodium  100 mg Oral Daily  . gabapentin  300 mg Oral QHS  . gentamicin cream  1 application Topical Daily  . levofloxacin  250 mg Oral q1800  . midodrine  10 mg Oral TID WC     Claudia Desanctis, MD 10/12/2019, 6:22 AM

## 2019-10-12 NOTE — Procedures (Signed)
Seen and examined on dialysis; procedure supervised.  Blood pressure 125/95 and HR 66.  Prev check 97/71.  On midodrine.  Left IJ tunn catheter.    Claudia Desanctis, MD 10/12/2019 8:39 AM

## 2019-10-13 LAB — PARATHYROID HORMONE, INTACT (NO CA): PTH: 122 pg/mL — ABNORMAL HIGH (ref 15–65)

## 2019-10-13 MED ORDER — LEVOFLOXACIN 250 MG PO TABS: 250.0000 mg | ORAL_TABLET | Freq: Every day | ORAL | 0 refills | Status: DC

## 2019-10-13 MED ORDER — OXYCODONE-ACETAMINOPHEN 5-325 MG PO TABS: 1.5000 | ORAL_TABLET | ORAL | 0 refills | Status: AC | PRN

## 2019-10-13 MED ORDER — VANCOMYCIN HCL IN DEXTROSE 1-5 GM/200ML-% IV SOLN: 1000.0000 mg | INTRAVENOUS | 0 refills | Status: AC

## 2019-10-13 MED ORDER — CLOPIDOGREL BISULFATE 75 MG PO TABS: 75.0000 mg | ORAL_TABLET | Freq: Every day | ORAL | 2 refills | Status: AC

## 2019-10-13 MED ORDER — MIDODRINE HCL 10 MG PO TABS: 10.0000 mg | ORAL_TABLET | Freq: Three times a day (TID) | ORAL | 2 refills | Status: AC

## 2019-10-13 MED ORDER — POLYETHYLENE GLYCOL 3350 17 G PO PACK: 17.0000 g | PACK | Freq: Every day | ORAL | 0 refills | Status: AC | PRN

## 2019-10-13 NOTE — Progress Notes (Addendum)
[facility]  Subjective  - Rested well last night.   Objective 125/85 (!) 103 98.3 F (36.8 C) (Oral) 19 100%  Intake/Output Summary (Last 24 hours) at 10/13/2019 0703 Last data filed at 10/13/2019 0028 Gross per 24 hour  Intake 480 ml  Output 2500 ml  Net -2020 ml   Palpable brisk DP B pulses Left LE incisions healing well, soft groin with 4 x 4 in place.  Left LE edema. Right groin soft B GT dry eschar left open to air.  Guaze between left GT and second toe. Lungs non labored breathing   Assessment/Planning: POD # 7 64 y.o.maleis s/p left femoral to popliteal bypass  Bypass patent with palpable pedal pulses Will cont. 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 F/U in 2-3 weeks with Dr. Fuller Plan 10/13/2019 7:03 AM --  Laboratory Lab Results: Recent Labs    10/11/19 0404 10/12/19 0553  WBC 13.2* 13.0*  HGB 9.3* 7.8*  HCT 30.0* 25.6*  PLT 339 343   BMET Recent Labs    10/11/19 0404 10/12/19 0553  NA 137 135  K 3.7 3.4*  CL 101 101  CO2 24 24  GLUCOSE 84 97  BUN 23 31*  CREATININE 3.32* 4.23*  CALCIUM 6.8* 6.7*    COAG Lab Results  Component Value Date   INR 1.2 10/06/2019   INR 1.6 (H) 10/01/2019   INR 1.0 05/26/2019   No results found for: PTT  I have independently interviewed and examined patient and agree with PA assessment and plan above.   Alizea Pell C. Donzetta Matters, MD [facility] [place] Office: [phone] Pager: [phone]

## 2019-10-13 NOTE — Progress Notes (Signed)
Pt stable at time of teaching. Abdominal wound dressing changed and pt educated on how and when to change it. No signs of distress noted. Pt states he understands all instructions given. Pain medication requested for 1 week from hospitalist. No other needs or concerns at this time. All questions answered. Will continue to monitor until he is off unit.

## 2019-10-13 NOTE — Care Management Important Message (Signed)
Important Message  Patient Details  Name: Aaron [name] MRN: [id number] Date of Birth: [birth year]   Medicare Important Message Given:  Yes     Shelda Altes 10/13/2019, 8:50 AM

## 2019-10-13 NOTE — Progress Notes (Signed)
Renal Navigator faxed discharge summary and renal note to patient's OP HD clinic/[facility] to provide continuity of care.  Alphonzo Cruise, [place] Renal Navigator [phone]

## 2019-10-13 NOTE — Progress Notes (Addendum)
[facility] Progress Note   Assessment/ Plan:   OP HD:MWF [facility] 4h 400/600 RIJ TDC 90kg Hep none - epo 2400 tiw - Fe 50mg / wk  Assessment/ Plan: 1. Syncope/ resp arrest - no further episodes    2. Refractory PD cath peritonitis - TNC 5314, 94% pmn's. WBC 30k--> 13.3 with treatment.  Vanc/ ceftaz 6/19--> changed to zosyn 6/22 evening.  There was some question if PD cath was exposed in wound, appreciate gen surg input. Initial plan to treat, allow midline wound to heal more before PD cath removal. Cultures from 6/19 growing enterococcus and Serratia, sensitivities reviewed, cultures 6/22 growing serratia and cell counts consistent with refractory PD peritonitis.   - Discussed with ID per previous nephrology attending --> would benefit from PD catheter removal in the setting of refractory PD cath peritonitis - per conversation with general surgery on 6/28 they would recommend waiting 6 weeks for PD catheter removal; general surgery has asked for the patient to follow-up with them in clinic and to follow-up with the surgeon that placed the PD catheter; pt states PD catheter was placed at [facility] and he states that he thinks his outpatient HD unit can set this up - Will need PD catheter flushed weekly - should arrange through outpatient HD unit. ]   3. PAD: s/p bypass grafting 6/24.  Note vascular recommends f/u in 2-3 weeks with Dr. Donzetta Matters.  Dry gangrene changes to bilateral feet  4. ESRD - on HD using TDC for now.  - HD per MWF schedule   5. Hyperkalemia - resolved.   6. Abd wound - slow healing wound after SBO surgery x 2 during May hospital stay  7. COPD - per primary  8. Hypotension: on midodrine 10 TID with intra-dialysis 10 mg if needed.  9. Anemia ckd - gets epo at op HD - reordered for every Monday dosing with aranesp 100 mcg 6/28 onward for now.   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 Phos acceptable; intact PTH pending.     Subjective:    Last HD on 6/30 with 2.5 kg UF.  Feels good today.  States he has been told he'll be going home later today  Review of systems:  Denies n/v  some shortness of breath last night - better; attributes to room hot and his COPD Denies chest pain   Objective:   BP 125/85 (BP Location: Right Arm)   Pulse (!) 103   Temp 98.3 F (36.8 C) (Oral)   Resp 19   Ht 6\' 4"  (1.93 m)   Wt 89.7 kg   SpO2 100%   BMI 24.06 kg/m   Physical Exam:  DBZ:MCEYE male seated in bed in NAD  CVS:S1S2 no rub Resp: clear and unlabored at rest Abd: distended, soft, nontender, abd wound dressed, PD cath RLQ dressed Ext: 1-2+ ankle edema LLE (post-op); none RLE ACCESS: PD cath intact, L IJ [facility] Neuro - alert and oriented x3 provides hx and follows commands Psych normal mood and affect   Labs: BMET Recent Labs  Lab 10/07/19 0117 10/07/19 0343 10/08/19 0759 10/09/19 0333 10/10/19 0451 10/11/19 0404 10/12/19 0553  NA 135 135 135 135 134* 137 135  K 4.3 4.3 4.3 4.1 4.1 3.7 3.4*  CL 100 101 99 100 102 101 101  CO2 23 24 23 22  18* 24 24  GLUCOSE 102* 98 97 109* 87 84 97  BUN 30* 30* 25* 34* 44* 23 31*  CREATININE 3.32*  2.94* 2.80* 3.67* 4.46* 3.32* 4.23*  CALCIUM 6.5* 6.5* 7.2* 6.8* 6.4* 6.8* 6.7*  PHOS  --  4.9*  --   --   --   --  4.2   CBC Recent Labs  Lab 10/09/19 0333 10/10/19 0451 10/11/19 0404 10/12/19 0553  WBC 15.5* 14.8* 13.2* 13.0*  HGB 8.3* 8.5* 9.3* 7.8*  HCT 26.6* 27.9* 30.0* 25.6*  MCV 93.3 95.5 95.8 97.3  PLT 317 340 339 343      Medications:    . apixaban  5 mg Oral BID  . atorvastatin  80 mg Oral Daily  . Chlorhexidine Gluconate Cloth  6 each Topical Q0600  . Chlorhexidine Gluconate Cloth  6 each Topical Q0600  . Chlorhexidine Gluconate Cloth  6 each Topical Q0600  . clopidogrel  75 mg Oral Daily  . darbepoetin (ARANESP) injection - NON-DIALYSIS  100 mcg Subcutaneous Q Mon-1800  . diltiazem  180 mg Oral Daily  . docusate sodium  100 mg Oral  Daily  . gabapentin  300 mg Oral QHS  . gentamicin cream  1 application Topical Daily  . levofloxacin  250 mg Oral q1800  . midodrine  10 mg Oral TID WC     Claudia Desanctis, MD 10/13/2019, 8:05 AM   Asked PA to contact outpatient HD unit and fax my note to them for continuity of care.    Claudia Desanctis 8:17 AM 10/13/2019

## 2019-10-13 NOTE — Discharge Summary (Signed)
Physician Discharge Summary  Aaron [name]:[id number] DOB: [birth year] DOA: 10/01/2019  PCP: Gwenlyn Saran [place] date: 10/01/2019 Discharge date: 10/13/2019  Admitted From: Home Disposition: Home  Recommendations for Outpatient Follow-up:  1. Follow up with PCP in 1-2 weeks 2. Please obtain BMP/CBC in one week 3. Please follow up with Dr. Donzetta Matters, Dr. Megan Salon, general surgery and [facility] surgeon to remove the PD catheter.  Follow-up with Dr. Denman George cardiologist at [place] pt ot Equipment/Devices none Discharge Condition:stable CODE STATUS: Full code Diet recommendation cardiac renal Brief/Interim Summary:64 y.o. Male with PMH of ESRD on peritoneal dialysis, history of permanent Atrial fibrillation on Eliquis who was recently admitted for small bowel obstruction status post exploratory laparatomy with retention sutures was at dialysis on 6/18 when he became dizzy and had syncopal episode and respiratory arrest. He was then sent to [facility] emergency department with he was noted to have elevated lactate at 6.6. He was then transferred to [facility] when en route developed atrial fibrillation with rapid ventricular response and was given Cardizem iv which dropped his blood pressure. He was then started on Neo-Synephrine and was admitted to the ICU. Patient was subsequently stable and was transferred out of the ICU. His current problems include peritonitis with peritoneal fluid cultures that are showing Enterococcus and Serratia. Still hasthe PD catheter but on hemodialysis. On antibiotics per infectious disease.  Discharge Diagnoses:  Principal Problem:   Peritonitis ([place]) Active Problems:   Septic shock (HCC)   Shortness of breath   Atrial fibrillation (HCC)   S/P thoracentesis   Ischemic ulcer of toe of right foot ([place])     #1  ESRD- patient was on peritoneal dialysis prior to admission.    He is getting hemodialysis  through left IJ dialysis catheter Monday Wednesday and Friday.    He will be on vancomycin 3 times a week at dialysis for 2 weeks.  #2  PD cath/peritonitis/septic shock status post exploratory laparotomy with bowel resection for recent small bowel obstruction-he received Zosyn during the hospital stay.  He was seen by infectious disease recommended to discharge him on vancomycin and levofloxacin for 2 weeks.  Patient will need to follow-up with Dr. Megan Salon within 2 weeks.   He is aware that he needs to follow-up with [facility] surgeon for removal of PD catheter.  #3 A. fib RVR followed by cardiology recommending elective cardioversion after 3 weeks of anticoagulation.  Follow-up with Dr. Alison Murray with [facility].    He was seen by cardiology in the hospital.  #4 bilateral lower extremity ischemia -followed by vascular.10/06/2019 status post -left common femoral endarterectomy angioplasty SFA and profunda femoris artery Stent to left common iliac artery, left external iliac artery, left lower extremity angiography, aortogram, right lower extremity angiography, stent to right SFA, left common femoral below-knee popliteal artery bypass Continue Eliquis. Follow-up with vascular upon discharge.  #5 acquired thrombophilia patient on Eliquis to prevent stroke  #6 left-sided pleural effusion status post thoracentesis 10/03/2019 with removal of 1200 cc of straw-colored fluid which appears to be transudate.  #7 significant deconditioning PT and OT recommends home health physical therapy and Occupational Therapy on discharge  Estimated body mass index is 24.06 kg/m as calculated from the following:   Height as of this encounter: 6\' 4"  (1.93 m).   Weight as of this encounter: 89.7 kg.  Discharge Instructions   Allergies as of 10/13/2019   No Known Allergies     Medication  List    STOP taking these medications   metoprolol tartrate 25 MG tablet Commonly known as: LOPRESSOR     TAKE these  medications   albuterol 108 (90 Base) MCG/ACT inhaler Commonly known as: VENTOLIN HFA Inhale 2 puffs into the lungs every 6 (six) hours as needed for wheezing or shortness of breath.   albuterol (2.5 MG/3ML) 0.083% nebulizer solution Commonly known as: PROVENTIL Inhale 3 mLs into the lungs 3 (three) times daily as needed for shortness of breath or wheezing.   apixaban 5 MG Tabs tablet Commonly known as: ELIQUIS Take 1 tablet (5 mg total) by mouth 2 (two) times daily.   atorvastatin 80 MG tablet Commonly known as: LIPITOR Take 80 mg by mouth daily.   Breo Ellipta 200-25 MCG/INH Aepb Generic drug: fluticasone furoate-vilanterol Inhale 1 puff into the lungs daily as needed (for respiratory issues.).   calcium acetate 667 MG capsule Commonly known as: PHOSLO Take 667 mg by mouth 3 (three) times daily with meals.   clopidogrel 75 MG tablet Commonly known as: PLAVIX Take 1 tablet (75 mg total) by mouth daily. Start taking on: October 14, 2019   Darbepoetin Alfa 60 MCG/0.3ML Sosy injection Commonly known as: ARANESP Inject 0.3 mLs (60 mcg total) into the vein every Wednesday with hemodialysis.   diltiazem 180 MG 24 hr capsule Commonly known as: CARDIZEM CD Take 1 capsule (180 mg total) by mouth daily.   docusate sodium 100 MG capsule Commonly known as: COLACE Take 1 capsule (100 mg total) by mouth daily.   gabapentin 300 MG capsule Commonly known as: NEURONTIN Take 1 capsule (300 mg total) by mouth at bedtime.   levofloxacin 250 MG tablet Commonly known as: LEVAQUIN Take 1 tablet (250 mg total) by mouth daily at 6 PM. Please provide sufficient quantity to treat through 7/12   midodrine 10 MG tablet Commonly known as: PROAMATINE Take 1 tablet (10 mg total) by mouth 3 (three) times daily with meals.   multivitamin Tabs tablet Take 1 tablet by mouth at bedtime.   ondansetron 4 MG tablet Commonly known as: ZOFRAN Take 1 tablet (4 mg total) by mouth every 8 (eight) hours as  needed for nausea.   oxyCODONE-acetaminophen 10-325 MG tablet Commonly known as: PERCOCET Take 1 tablet by mouth 5 (five) times daily as needed for pain.   pantoprazole 40 MG tablet Commonly known as: PROTONIX Take 1 tablet (40 mg total) by mouth 2 (two) times daily. What changed: when to take this   polyethylene glycol 17 g packet Commonly known as: MIRALAX / GLYCOLAX Take 17 g by mouth daily as needed for moderate constipation.   senna-docusate 8.6-50 MG tablet Commonly known as: Senokot-S Take 1 tablet by mouth 2 (two) times daily as needed for moderate constipation.   vancomycin 1-5 GM/200ML-% Soln Commonly known as: VANCOCIN Inject 200 mLs (1,000 mg total) into the vein every Monday, Wednesday, and Friday with hemodialysis. Start taking on: October 14, 2019   varenicline 0.5 MG tablet Commonly known as: CHANTIX Take 0.5 mg by mouth 2 (two) times daily.       Follow-up Information    Ralene Ok, MD. Go on 10/21/2019.   Specialty: General Surgery Why: Follow up at 9:00 AM. Please arrive 30 min prior  Contact information: [address] [place] [address] [phone]        Demetrius Revel, MD. Schedule an appointment as soon as possible for a visit.   Specialty: Surgery Contact information: [phone]  [facility] [address] [place] [address] [phone]        Waynetta Sandy, MD Follow up in 2 week(s).   Specialties: Vascular Surgery, Cardiology Why: office will call Contact information: [address] [place] [address] [phone]        Michel Bickers, MD Follow up.   Specialty: Infectious Diseases Contact information: [address] [place] [address] [phone]              No Known Allergies  Consultations:  Infectious disease, interventional radiology, vascular surgery PCCM, nephrology   Procedures/Studies: DG Chest 1 View  Result Date: 10/03/2019 CLINICAL DATA:  Status post  thoracentesis EXAM: CHEST  1 VIEW COMPARISON:  10/01/2018 FINDINGS: Cardiac shadow is stable. Dialysis catheter is again seen. Small right pleural effusion is again noted. Left effusion is been reduced following thoracentesis. No pneumothorax is noted. No bony abnormality is noted. IMPRESSION: No pneumothorax following left thoracentesis. Electronically Signed   By: Inez Catalina M.D.   On: 10/03/2019 11:49   DG Abdomen 1 View  Result Date: 09/20/2019 CLINICAL DATA:  Abdominal distension EXAM: ABDOMEN - 1 VIEW COMPARISON:  09/03/2019 FINDINGS: Nonobstructive bowel gas pattern. Moderate volume of stool projects throughout the colon. Catheter tubing overlies the central abdomen. Peritoneal catheter tubing within the central pelvis. Right iliac vascular stent. Severe arthropathy of the right hip joint. The visualized portion of the chest demonstrates a large bore catheter terminating at the level of the superior cavoatrial junction. IMPRESSION: Nonobstructive bowel gas pattern. Moderate volume of stool throughout the colon. Electronically Signed   By: Davina Poke D.O.   On: 09/20/2019 14:35   CT HEAD WO CONTRAST  Result Date: 09/20/2019 CLINICAL DATA:  Fall last night striking head on sofa. On anticoagulation. EXAM: CT HEAD WITHOUT CONTRAST TECHNIQUE: Contiguous axial images were obtained from the base of the skull through the vertex without intravenous contrast. COMPARISON:  Head CT 03/14/2019 FINDINGS: Brain: No acute hemorrhage. Stable degree of atrophy and chronic small vessel ischemia. Extra-axial CSF density in the right middle cranial fossa is unchanged from prior exam, most consistent with arachnoid cyst. No midline shift or mass effect. No evidence of acute ischemia. Vascular: Atherosclerosis of skullbase vasculature without hyperdense vessel or abnormal calcification. Skull: No fracture or focal lesion. Sinuses/Orbits: Chronic opacification of right maxillary sinus. Right mastoid air cells are hypo  pneumatized. No skull fracture or acute osseous abnormality. Other: None. IMPRESSION: 1. No acute intracranial abnormality. No skull fracture. 2. Stable atrophy and chronic small vessel ischemia. 3. Unchanged right middle cranial fossa arachnoid cyst. Electronically Signed   By: Keith Rake M.D.   On: 09/20/2019 21:58   CT ANGIO AO+BIFEM W & OR WO CONTRAST  Result Date: 10/04/2019 CLINICAL DATA:  Claudication or leg ischemia, prior revascularization. Absent femoral pulse. EXAM: CT ANGIOGRAPHY OF ABDOMINAL AORTA WITH ILIOFEMORAL RUNOFF TECHNIQUE: Multidetector CT imaging of the chest, abdomen, pelvis and lower extremities was performed using the standard protocol during bolus administration of intravenous contrast. Multiplanar CT image reconstructions and MIPs were obtained to evaluate the vascular anatomy. CONTRAST:  172mL OMNIPAQUE IOHEXOL 350 MG/ML SOLN COMPARISON:  Abdominal CT 09/03/2019. FINDINGS: Chest CTA: Vascular: Left internal jugular dialysis catheter tip in the right atrium. Aortic atherosclerosis with both calcified and noncalcified plaque. Some irregular plaque involving the distal transverse aorta. No evidence of acute aortic syndrome or penetrating ulcer. No aneurysm or aortic dissection. Conventional branching pattern from the aortic arch. 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 Moderate atherosclerosis of the additional aortic branch vessels. No central pulmonary embolus to the segmental level. Multi chamber cardiomegaly. No significant pericardial effusion. Mediastinum/nodes: Few scattered prominent mediastinal and hilar nodes that are not enlarged by size criteria. No esophageal wall thickening. No visualized thyroid nodule. Lungs: Moderate to advanced emphysema. Small to moderate bilateral pleural effusions. There is adjacent compressive atelectasis. There also tree in bud opacities in the left lower lobe, a dependent  lingula and right upper lobe. There is debris within the right mainstem bronchus. No dominant pulmonary mass. Musculoskeletal: Heterogeneous subcutaneous fluid in the low left axilla in left abdominal wall without peripherally enhancing or focal fluid collection. Degenerative change throughout the thoracic spine. There are no acute or suspicious osseous abnormalities. Abdominal CTA: VASCULAR Aorta: No aneurysm or dissection. No evidence of vasculitis or acute abnormality. Moderate calcified noncalcified atheromatous plaque. Circumferential plaque in the infrarenal aorta causes minor luminal narrowing. Celiac: Mild plaque at the origin does not cause significant stenosis. The branch vessels are patent. SMA: Calcified noncalcified plaque in the origin and proximal vessel without significant luminal narrowing. Replaced right hepatic artery arises from the SMA. More distal branch vessels are patent. Renals: Diffusely diseased, diminutive in caliber. Multifocal areas of high-grade stenosis of both renal arteries. IMA: Stenosis at the origin but otherwise patent. RIGHT Lower Extremity Inflow: Moderate plaque involving the common iliac artery. Internal iliac artery is occluded at the origin this in distal reconstitution from collaterals. Stent in the external iliac artery which is patent. Outflow: Common femoral and profundus femoral arteries are patent. There is irregular plaque in the profundus femoral artery with areas of segmental narrowing. Stent in the proximal superficial femoral artery which is patent. The right superficial femoral artery is diffusely diseased, small in caliber with multiple areas of high-grade stenosis. Greater than 50% stenosis involving the popliteal artery. Runoff: Patent 2 vessel runoff to the ankle. LEFT Lower Extremity Inflow: Mix plaque causes greater than 50% stenosis of the left common iliac artery. The internal iliac artery is patent. The external iliac artery is occluded, also seen on  prior exam. There is minimal distal reconstitution at the common femoral which is diminutive in caliber. Outflow: Minimal flow in the common femoral artery which is completely occluded over approximately 6 cm. The superficial femoral artery is completely occluded. Apparent stent in the distal superficial femoral artery is also occluded. There is some collateral fluid to the lower extremity. Popliteal artery is reconstituted but diminutive in caliber. Runoff: Patent three vessel runoff to the ankle. Veins: No obvious venous abnormality within the limitations of this arterial phase study. The portal vein is patent. Review of the MIP images confirms the above findings. NON-VASCULAR Hepatobiliary: Multiple low-density lesions throughout the liver, grossly similar imaging last month, not well assessed on arterial phase imaging. Gallbladder physiologically distended, no calcified stone. No biliary dilatation. Pancreas: Choose Spleen: Normal in size and arterial enhancement. Adrenals/Urinary Tract: No adrenal nodule. Enlarged polycystic kidneys. Scattered renal vascular and parenchymal calcifications. No hydronephrosis. Urinary bladder is minimally distended. Stomach/Bowel: Stomach is unremarkable. There is mild hyperemia of small bowel loops and diffuse wall thickening without evidence of obstruction. Few small bowel loops opposed the anterior abdominal wall at site of abdominal wall defect. No obstruction. Normal appendix. Stool throughout the colon without colonic wall thickening or inflammation. Scattered colonic diverticula. Lymphatic: Few scattered bilateral common and external iliac lymph nodes that are not enlarged by size criteria. There is  no evidence of bulky adenopathy. Reproductive: Prostate is unremarkable. Other: Peritoneal dialysis catheter in place with tip in the lower abdomen. There is ill-defined fluid insinuating between small bowel loops and small volume of abdominal ascites. No evidence of abscess or  focal fluid collection. There are few scattered foci of extraluminal air within the anterior abdomen, for example series 10, image 130. This may be related to presence of peritoneal dialysis catheter, but are nonspecific. Midline abdominal wound with small amount of fluid along the subcutaneous portion of the catheter. There is bilateral flank edema. Musculoskeletal: Chronic partial fusion of L1-L2. Diffuse degenerative and postsurgical change in the lumbar spine. Degenerative change involving the right hip. IMPRESSION: Vascular: 1. 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 2. Chronically occluded left external iliac artery, minimal distal reconstitution. Occluded left superficial femoral artery. Distal reconstitution at the popliteal with three-vessel runoff to the left foot. 3. Diffusely diseased right superficial femoral artery with multifocal areas of high-grade stenosis. There is 2 vessel runoff to the right ankle. Nonvascular: 1. Mild hyperemia of small bowel loops and diffuse wall thickening without evidence of obstruction, may be secondary to enteritis. 2. Non organized ascites/free fluid in the abdomen. Few foci of extraluminal air in the upper abdomen are likely related to presence of peritoneal dialysis catheter. 3. Small to moderate bilateral pleural effusions with adjacent compressive atelectasis. 4. Tree in bud opacities in the left lower lobe and to a lesser extent lingula and dependent right upper lobe suggestive of bronchiolitis. 5. Moderate to advanced emphysema. 6. Additional chronic findings as described. Aortic Atherosclerosis (ICD10-I70.0) and Emphysema (ICD10-J43.9). Electronically Signed   By: Keith Rake M.D.   On: 10/04/2019 20:02   DG CHEST PORT 1 VIEW  Result Date: 10/01/2019 CLINICAL DATA:  Shortness of breath EXAM: PORTABLE CHEST 1 VIEW COMPARISON:  September 30, 2019 FINDINGS: Prominent skin fold is seen over the  right lateral upper lung. No pneumothorax. The left-sided dialysis catheter is stable. Increasing opacities in the bases. Stable cardiomegaly. No other acute abnormalities. IMPRESSION: 1. Increasing opacities in the bases bilaterally may represent layering effusion with underlying atelectasis. However, infiltrate such as pneumonia or aspiration are not excluded and could have the same appearance. 2. No other abnormalities. Electronically Signed   By: Dorise Bullion III M.D   On: 10/01/2019 10:34   DG Chest Portable 1 View  Result Date: 09/20/2019 CLINICAL DATA:  Weakness.  Increased shortness of breath. EXAM: PORTABLE CHEST 1 VIEW COMPARISON:  09/04/2019 FINDINGS: Increased densities at the left lung base compared to the previous examination. Left jugular dialysis catheter is stable with the tip at the SVC and right atrium junction. Lucency in both lungs compatible with chronic lung changes. Heart size is upper limits of normal and stable. Right costophrenic angle is not completely imaged on this examination. Surgical plate in the cervical spine. IMPRESSION: Left basilar chest densities. Findings are nonspecific but could represent atelectasis, pleural fluid or even infection. Stable appearance of the dialysis catheter. Electronically Signed   By: Markus Daft M.D.   On: 09/20/2019 14:03   DG Foot 2 Views Left  Result Date: 09/20/2019 CLINICAL DATA:  History of recent fall. EXAM: LEFT FOOT - 2 VIEW COMPARISON:  Aug 22, 2019 FINDINGS: There is no evidence of an acute fracture or dislocation. Chronic deformities are seen involving the distal aspects of the first and second left metatarsals, as well as the middle phalanges of the second  and third left toes. Moderate severity degenerative changes are seen involving the metatarsophalangeal articulation of the left great toe. Soft tissues are unremarkable. IMPRESSION: Chronic and degenerative changes, without evidence of acute osseous abnormality. Electronically  Signed   By: Virgina Norfolk M.D.   On: 09/20/2019 22:02   CT ANGIO CHEST AORTA W/CM & OR WO/CM  Result Date: 10/04/2019 CLINICAL DATA:  Claudication or leg ischemia, prior revascularization. Absent femoral pulse. EXAM: CT ANGIOGRAPHY OF ABDOMINAL AORTA WITH ILIOFEMORAL RUNOFF TECHNIQUE: Multidetector CT imaging of the chest, abdomen, pelvis and lower extremities was performed using the standard protocol during bolus administration of intravenous contrast. Multiplanar CT image reconstructions and MIPs were obtained to evaluate the vascular anatomy. CONTRAST:  166mL OMNIPAQUE IOHEXOL 350 MG/ML SOLN COMPARISON:  Abdominal CT 09/03/2019. FINDINGS: Chest CTA: Vascular: Left internal jugular dialysis catheter tip in the right atrium. Aortic atherosclerosis with both calcified and noncalcified plaque. Some irregular plaque involving the distal transverse aorta. No evidence of acute aortic syndrome or penetrating ulcer. No aneurysm or aortic dissection. Conventional branching pattern from the aortic arch. 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 Moderate atherosclerosis of the additional aortic branch vessels. No central pulmonary embolus to the segmental level. Multi chamber cardiomegaly. No significant pericardial effusion. Mediastinum/nodes: Few scattered prominent mediastinal and hilar nodes that are not enlarged by size criteria. No esophageal wall thickening. No visualized thyroid nodule. Lungs: Moderate to advanced emphysema. Small to moderate bilateral pleural effusions. There is adjacent compressive atelectasis. There also tree in bud opacities in the left lower lobe, a dependent lingula and right upper lobe. There is debris within the right mainstem bronchus. No dominant pulmonary mass. Musculoskeletal: Heterogeneous subcutaneous fluid in the low left axilla in left abdominal wall without peripherally enhancing or focal fluid collection.  Degenerative change throughout the thoracic spine. There are no acute or suspicious osseous abnormalities. Abdominal CTA: VASCULAR Aorta: No aneurysm or dissection. No evidence of vasculitis or acute abnormality. Moderate calcified noncalcified atheromatous plaque. Circumferential plaque in the infrarenal aorta causes minor luminal narrowing. Celiac: Mild plaque at the origin does not cause significant stenosis. The branch vessels are patent. SMA: Calcified noncalcified plaque in the origin and proximal vessel without significant luminal narrowing. Replaced right hepatic artery arises from the SMA. More distal branch vessels are patent. Renals: Diffusely diseased, diminutive in caliber. Multifocal areas of high-grade stenosis of both renal arteries. IMA: Stenosis at the origin but otherwise patent. RIGHT Lower Extremity Inflow: Moderate plaque involving the common iliac artery. Internal iliac artery is occluded at the origin this in distal reconstitution from collaterals. Stent in the external iliac artery which is patent. Outflow: Common femoral and profundus femoral arteries are patent. There is irregular plaque in the profundus femoral artery with areas of segmental narrowing. Stent in the proximal superficial femoral artery which is patent. The right superficial femoral artery is diffusely diseased, small in caliber with multiple areas of high-grade stenosis. Greater than 50% stenosis involving the popliteal artery. Runoff: Patent 2 vessel runoff to the ankle. LEFT Lower Extremity Inflow: Mix plaque causes greater than 50% stenosis of the left common iliac artery. The internal iliac artery is patent. The external iliac artery is occluded, also seen on prior exam. There is minimal distal reconstitution at the common femoral which is diminutive in caliber. Outflow: Minimal flow in the common femoral artery which is completely occluded over approximately 6 cm. The superficial femoral artery is completely occluded.  Apparent stent in  the distal superficial femoral artery is also occluded. There is some collateral fluid to the lower extremity. Popliteal artery is reconstituted but diminutive in caliber. Runoff: Patent three vessel runoff to the ankle. Veins: No obvious venous abnormality within the limitations of this arterial phase study. The portal vein is patent. Review of the MIP images confirms the above findings. NON-VASCULAR Hepatobiliary: Multiple low-density lesions throughout the liver, grossly similar imaging last month, not well assessed on arterial phase imaging. Gallbladder physiologically distended, no calcified stone. No biliary dilatation. Pancreas: Choose Spleen: Normal in size and arterial enhancement. Adrenals/Urinary Tract: No adrenal nodule. Enlarged polycystic kidneys. Scattered renal vascular and parenchymal calcifications. No hydronephrosis. Urinary bladder is minimally distended. Stomach/Bowel: Stomach is unremarkable. There is mild hyperemia of small bowel loops and diffuse wall thickening without evidence of obstruction. Few small bowel loops opposed the anterior abdominal wall at site of abdominal wall defect. No obstruction. Normal appendix. Stool throughout the colon without colonic wall thickening or inflammation. Scattered colonic diverticula. Lymphatic: Few scattered bilateral common and external iliac lymph nodes that are not enlarged by size criteria. There is no evidence of bulky adenopathy. Reproductive: Prostate is unremarkable. Other: Peritoneal dialysis catheter in place with tip in the lower abdomen. There is ill-defined fluid insinuating between small bowel loops and small volume of abdominal ascites. No evidence of abscess or focal fluid collection. There are few scattered foci of extraluminal air within the anterior abdomen, for example series 10, image 130. This may be related to presence of peritoneal dialysis catheter, but are nonspecific. Midline abdominal wound with small amount  of fluid along the subcutaneous portion of the catheter. There is bilateral flank edema. Musculoskeletal: Chronic partial fusion of L1-L2. Diffuse degenerative and postsurgical change in the lumbar spine. Degenerative change involving the right hip. IMPRESSION: Vascular: 1. 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 2. Chronically occluded left external iliac artery, minimal distal reconstitution. Occluded left superficial femoral artery. Distal reconstitution at the popliteal with three-vessel runoff to the left foot. 3. Diffusely diseased right superficial femoral artery with multifocal areas of high-grade stenosis. There is 2 vessel runoff to the right ankle. Nonvascular: 1. Mild hyperemia of small bowel loops and diffuse wall thickening without evidence of obstruction, may be secondary to enteritis. 2. Non organized ascites/free fluid in the abdomen. Few foci of extraluminal air in the upper abdomen are likely related to presence of peritoneal dialysis catheter. 3. Small to moderate bilateral pleural effusions with adjacent compressive atelectasis. 4. Tree in bud opacities in the left lower lobe and to a lesser extent lingula and dependent right upper lobe suggestive of bronchiolitis. 5. Moderate to advanced emphysema. 6. Additional chronic findings as described. Aortic Atherosclerosis (ICD10-I70.0) and Emphysema (ICD10-J43.9). Electronically Signed   By: Keith Rake M.D.   On: 10/04/2019 20:02   VAS [place] ABI WITH/WO TBI  Result Date: 10/04/2019 LOWER EXTREMITY DOPPLER STUDY Indications: Rest pain, peripheral artery disease, and Right great toe cyanosis,              left great toe wound s/p fall x~1 week. High Risk Factors: Hypertension, hyperlipidemia, coronary artery disease.  Vascular Interventions: 08/14/2017- insertion of right CIA, right EIA, right SFA                         stents. Limitations: Today's exam was limited due to  involuntary patient movement. Comparison Study: 01/03/2019- bilateral noncompressible ABI Performing Technologist:  Simonetti, Michelle MHA, RVT, RDCS, RDMS  Examination Guidelines: A complete evaluation includes at minimum, Doppler waveform signals and systolic blood pressure reading at the level of bilateral brachial, anterior tibial, and posterior tibial arteries, when vessel segments are accessible. Bilateral testing is considered an integral part of a complete examination. Photoelectric Plethysmograph (PPG) waveforms and toe systolic pressure readings are included as required and additional duplex testing as needed. Limited examinations for reoccurring indications may be performed as noted.  ABI Findings: +---------+------------------+-----+----------+--------+ Right    Rt Pressure (mmHg)IndexWaveform  Comment  +---------+------------------+-----+----------+--------+ Brachial 137                    triphasic          +---------+------------------+-----+----------+--------+ PTA      98                0.72 monophasic         +---------+------------------+-----+----------+--------+ DP       23                0.17 monophasic         +---------+------------------+-----+----------+--------+ Great Toe                       Absent             +---------+------------------+-----+----------+--------+ +---------+------------------+-----+--------+----------------------------------+ Left     Lt Pressure (mmHg)IndexWaveformComment                            +---------+------------------+-----+--------+----------------------------------+ Brachial                        biphasicUnable to obtain pressure due to                                           movement                           +---------+------------------+-----+--------+----------------------------------+ PTA                             absent                                      +---------+------------------+-----+--------+----------------------------------+ DP                              absent                                     +---------+------------------+-----+--------+----------------------------------+ Great Toe                       Absent                                     +---------+------------------+-----+--------+----------------------------------+ +-------+-----------+-----------+------------+------------+ ABI/TBIToday's ABIToday's TBIPrevious ABIPrevious TBI +-------+-----------+-----------+------------+------------+ Right  0.72                                           +-------+-----------+-----------+------------+------------+  Left   0.00                                           +-------+-----------+-----------+------------+------------+  Summary: Right: Resting right ankle-brachial index indicates moderate right lower extremity arterial disease. Given previous study was noncompressible, today's ABI is possibly falsely elevated. Left: Absent left pedal pulses indicate critical left limb ischemia.  *See table(s) above for measurements and observations.  Electronically signed by Servando Snare MD on 10/04/2019 at 5:16:50 PM.    Final    VAS [place] LOWER EXTREMITY SAPHENOUS VEIN MAPPING  Result Date: 10/05/2019 LOWER EXTREMITY VEIN MAPPING Indications:       pre op Other Indications: PAD Risk Factors:      Hypertension, hyperlipidemia, coronary artery disease.  Comparison Study: no prior Performing Technologist: Abram Sander RVS  Examination Guidelines: A complete evaluation includes B-mode imaging, spectral Doppler, color Doppler, and power Doppler as needed of all accessible portions of each vessel. Bilateral testing is considered an integral part of a complete examination. Limited examinations for reoccurring indications may be performed as noted. +---------------+-------------+---------------------+--------------+-----------+   RT Diameter    RT Findings          GSV          LT Diameter  LT Findings      (cm)                                             (cm)                 +---------------+-------------+---------------------+--------------+-----------+      0.47                      Saphenofemoral                                                                Junction                                 +---------------+-------------+---------------------+--------------+-----------+      0.37                      Proximal thigh                              +---------------+-------------+---------------------+--------------+-----------+      0.29                         Mid thigh                                +---------------+-------------+---------------------+--------------+-----------+      0.24                       Distal thigh                               +---------------+-------------+---------------------+--------------+-----------+  0.31                           Knee                                   +---------------+-------------+---------------------+--------------+-----------+      0.30                         Prox calf                                +---------------+-------------+---------------------+--------------+-----------+                  branches         Mid calf                                 +---------------+-------------+---------------------+--------------+-----------+                     not          Distal calf                                               visualized                                                 +---------------+-------------+---------------------+--------------+-----------+                     not             Ankle                                                  visualized                                                 +---------------+-------------+---------------------+--------------+-----------+ Diagnosing physician:  Curt Jews MD Electronically signed by Curt Jews MD on 10/05/2019 at 3:42:57 PM.    Final    VAS [place] LOWER EXTREMITY ARTERIAL DUPLEX  Result Date: 10/04/2019 LOWER EXTREMITY ARTERIAL DUPLEX STUDY Indications: Rest pain, and right great toe cyanosis, left great toe wound s/p              fall ~1week. High Risk Factors: Hypertension, hyperlipidemia, coronary artery disease.  Vascular Interventions: 08/14/2017- right CIA, right EIA, right SFA stents. Current ABI:            Right= 0.7 left=0 Limitations: involuntary patient movement Comparison Study: No prior study Performing Technologist: Maudry Mayhew MHA, RDMS, RVT, RDCS  Examination Guidelines: A complete evaluation includes B-mode imaging, spectral Doppler, color Doppler, and power Doppler as needed of all accessible portions of each vessel. Bilateral testing is considered an integral part  of a complete examination. Limited examinations for reoccurring indications may be performed as noted.  +----------+--------+-----+---------------+----------+--------+ RIGHT     PSV cm/sRatioStenosis       Waveform  Comments +----------+--------+-----+---------------+----------+--------+ EIA Distal83                          monophasic         +----------+--------+-----+---------------+----------+--------+ CFA Prox  46                          monophasic         +----------+--------+-----+---------------+----------+--------+ CFA Distal33                          monophasic         +----------+--------+-----+---------------+----------+--------+ DFA       53                          monophasic         +----------+--------+-----+---------------+----------+--------+ SFA Prox  593          75-99% stenosismonophasic         +----------+--------+-----+---------------+----------+--------+ SFA Mid   57                          monophasic         +----------+--------+-----+---------------+----------+--------+ SFA Distal51                           monophasic         +----------+--------+-----+---------------+----------+--------+ POP Prox  20                          monophasic         +----------+--------+-----+---------------+----------+--------+ POP Distal75                          monophasic         +----------+--------+-----+---------------+----------+--------+ TP Trunk  41                          monophasic         +----------+--------+-----+---------------+----------+--------+ ATA Distal32                          monophasic         +----------+--------+-----+---------------+----------+--------+ PTA Prox  10                          monophasic         +----------+--------+-----+---------------+----------+--------+ PTA Distal48                          monophasic         +----------+--------+-----+---------------+----------+--------+ DP        36                          monophasic         +----------+--------+-----+---------------+----------+--------+ Unable to visualize peroneal artery.  +----------+--------+-----+--------+-------------------+--------+ LEFT      PSV cm/sRatioStenosisWaveform           Comments +----------+--------+-----+--------+-------------------+--------+ CFA Distal  Absent                      +----------+--------+-----+--------+-------------------+--------+ SFA Prox                       Absent                      +----------+--------+-----+--------+-------------------+--------+ SFA Mid                        Absent                      +----------+--------+-----+--------+-------------------+--------+ SFA Distal                     Absent                      +----------+--------+-----+--------+-------------------+--------+ POP Prox  20                   dampened monophasic         +----------+--------+-----+--------+-------------------+--------+ POP Distal28                   dampened  monophasic         +----------+--------+-----+--------+-------------------+--------+ ATA Distal9                    dampened monophasic         +----------+--------+-----+--------+-------------------+--------+ PTA Prox  10                   dampened monophasic         +----------+--------+-----+--------+-------------------+--------+ PTA Distal7                    dampened monophasic         +----------+--------+-----+--------+-------------------+--------+ DP        5                    dampened monophasic         +----------+--------+-----+--------+-------------------+--------+ Unable to visualize peroneal artery.  Summary: Right: 75-99% stenosis noted in the superficial femoral artery. Left: Total occlusion noted in the common femoral artery. Total occlusion noted in the deep femoral artery. Total occlusion noted in the superficial femoral artery. Minimal reconstitution of flow at the popliteal artery.  See table(s) above for measurements and observations. Electronically signed by Servando Snare MD on 10/04/2019 at 5:17:09 PM.    Final    HYBRID OR IMAGING ([place])  Result Date: 10/06/2019 There is no interpretation for this exam.  This order is for images obtained during a surgical procedure.  Please See "Surgeries" Tab for more information regarding the procedure.   (Echo, Carotid, EGD, Colonoscopy, ERCP)    Subjective: Patient is resting in bed anxious to go home wants to have the PD catheter taken out ASAP  Discharge Exam: Vitals:   10/13/19 0500 10/13/19 0831  BP: 125/85 118/84  Pulse: (!) 103 (!) 104  Resp: 19 20  Temp: 98.3 F (36.8 C) 98.2 F (36.8 C)  SpO2: 100% 100%   Vitals:   10/12/19 1039 10/12/19 2210 10/13/19 0500 10/13/19 0831  BP: 130/80 112/77 125/85 118/84  Pulse: 99 (!) 53 (!) 103 (!) 104  Resp: 20 19 19 20   Temp: 97.6 F (36.4 C) 98.8 F (37.1 C) 98.3 F (36.8 C) 98.2 F (36.8 C)  TempSrc: Oral Oral  Oral Oral  SpO2: 96% 98% 100% 100%   Weight: 89.7 kg  89.7 kg   Height:        General: Pt is alert, awake, not in acute distress Cardiovascular: RRR, S1/S2 +, no rubs, no gallops Respiratory: Diminished breath sounds bilaterally, no wheezing, no rhonchi Abdominal: Soft, NT, ND, bowel sounds + Extremities: 2+ edema left lower extremity no cyanosis    The results of significant diagnostics from this hospitalization (including imaging, microbiology, ancillary and laboratory) are listed below for reference.     Microbiology: Recent Results (from the past 240 hour(s))  Body fluid culture (includes gram stain)     Status: None   Collection Time: 10/03/19  2:25 PM   Specimen: Pleural Fluid  Result Value Ref Range Status   Specimen Description FLUID PLEURAL LEFT  Final   Special Requests NONE  Final   Gram Stain   Final    WBC PRESENT,BOTH PMN AND MONONUCLEAR NO ORGANISMS SEEN CYTOSPIN SMEAR    Culture   Final    NO GROWTH 3 DAYS Performed at [facility], [address], [place], [place] [address]    Report Status 10/06/2019 FINAL  Final  Body fluid culture     Status: None   Collection Time: 10/04/19 10:30 AM   Specimen: Peritoneal Dialysate; Body Fluid  Result Value Ref Range Status   Specimen Description PERITONEAL DIALYSATE  Final   Special Requests NONE  Final   Gram Stain   Final    ABUNDANT WBC PRESENT,BOTH PMN AND MONONUCLEAR NO ORGANISMS SEEN    Culture   Final    MODERATE SERRATIA MARCESCENS FEW ENTEROCOCCUS FAECALIS CRITICAL RESULT CALLED TO, READ BACK BY AND VERIFIED WITH: RN Howard Pouch [phone] AT 1038 BY CM Performed at [facility], [place] [address], [place], [place] [address]    Report Status 10/08/2019 FINAL  Final   Organism ID, Bacteria SERRATIA MARCESCENS  Final   Organism ID, Bacteria ENTEROCOCCUS FAECALIS  Final      Susceptibility   Enterococcus faecalis - MIC*    AMPICILLIN <=2 SENSITIVE Sensitive     VANCOMYCIN 1 SENSITIVE Sensitive     GENTAMICIN SYNERGY RESISTANT  Resistant     * FEW ENTEROCOCCUS FAECALIS   Serratia marcescens - MIC*    CEFAZOLIN >=64 RESISTANT Resistant     CEFEPIME 0.5 SENSITIVE Sensitive     CEFTAZIDIME <=1 SENSITIVE Sensitive     CEFTRIAXONE 0.5 SENSITIVE Sensitive     CIPROFLOXACIN <=0.25 SENSITIVE Sensitive     GENTAMICIN <=1 SENSITIVE Sensitive     TRIMETH/SULFA <=20 SENSITIVE Sensitive     * MODERATE SERRATIA MARCESCENS  Surgical pcr screen     Status: None   Collection Time: 10/05/19  9:53 PM   Specimen: Nasal Mucosa; Nasal Swab  Result Value Ref Range Status   MRSA, PCR NEGATIVE NEGATIVE Final   Staphylococcus aureus NEGATIVE NEGATIVE Final    Comment: (NOTE) The Xpert SA Assay (FDA approved for NASAL specimens in patients 29 years of age and older), is one component of a comprehensive surveillance program. It is not intended to diagnose infection nor to guide or monitor treatment. Performed at [facility], [place] [address], [place], [place] [address]      Labs: BNP (last 3 results) Recent Labs    05/26/19 1542 08/22/19 1930 10/01/19 0643  BNP 322.8* 1,578.0* 1,696.7*   Basic Metabolic Panel: Recent Labs  Lab 10/07/19 8938 10/07/19 1017 10/08/19 0759 10/09/19 5102  10/10/19 0451 10/11/19 0404 10/12/19 0553  NA 135   < > 135 135 134* 137 135  K 4.3   < > 4.3 4.1 4.1 3.7 3.4*  CL 101   < > 99 100 102 101 101  CO2 24   < > 23 22 18* 24 24  GLUCOSE 98   < > 97 109* 87 84 97  BUN 30*   < > 25* 34* 44* 23 31*  CREATININE 2.94*   < > 2.80* 3.67* 4.46* 3.32* 4.23*  CALCIUM 6.5*   < > 7.2* 6.8* 6.4* 6.8* 6.7*  MG 1.9  --   --   --   --  1.7  --   PHOS 4.9*  --   --   --   --   --  4.2   < > = values in this interval not displayed.   Liver Function Tests: Recent Labs  Lab 10/10/19 0451  AST 23  ALT 15  ALKPHOS 153*  BILITOT 0.6  PROT 4.6*  ALBUMIN 1.5*   No results for input(s): LIPASE, AMYLASE in the last 168 hours. No results for input(s): AMMONIA in the last 168 hours. CBC: Recent  Labs  Lab 10/08/19 0759 10/09/19 0333 10/10/19 0451 10/11/19 0404 10/12/19 0553  WBC 13.3* 15.5* 14.8* 13.2* 13.0*  HGB 8.1* 8.3* 8.5* 9.3* 7.8*  HCT 26.1* 26.6* 27.9* 30.0* 25.6*  MCV 93.5 93.3 95.5 95.8 97.3  PLT 259 317 340 339 343   Cardiac Enzymes: No results for input(s): CKTOTAL, CKMB, CKMBINDEX, TROPONINI in the last 168 hours. BNP: Invalid input(s): POCBNP CBG: Recent Labs  Lab 10/06/19 2258 10/07/19 0732 10/07/19 1119 10/08/19 0749 10/08/19 1148  GLUCAP 101* 118* 134* 97 110*   D-Dimer No results for input(s): DDIMER in the last 72 hours. Hgb A1c No results for input(s): HGBA1C in the last 72 hours. Lipid Profile No results for input(s): CHOL, HDL, LDLCALC, TRIG, CHOLHDL, LDLDIRECT in the last 72 hours. Thyroid function studies No results for input(s): TSH, T4TOTAL, T3FREE, THYROIDAB in the last 72 hours.  Invalid input(s): FREET3 Anemia work up No results for input(s): VITAMINB12, FOLATE, FERRITIN, TIBC, IRON, RETICCTPCT in the last 72 hours. Urinalysis    Component Value Date/Time   COLORURINE YELLOW 05/27/2019 0524   APPEARANCEUR CLEAR 05/27/2019 0524   LABSPEC 1.025 05/27/2019 0524   PHURINE 6.0 05/27/2019 0524   GLUCOSEU 150 (A) 05/27/2019 0524   HGBUR MODERATE (A) 05/27/2019 0524   BILIRUBINUR NEGATIVE 05/27/2019 0524   KETONESUR NEGATIVE 05/27/2019 0524   PROTEINUR 100 (A) 05/27/2019 0524   UROBILINOGEN 0.2 03/03/2010 1056   NITRITE NEGATIVE 05/27/2019 0524   LEUKOCYTESUR TRACE (A) 05/27/2019 0524   Sepsis Labs Invalid input(s): PROCALCITONIN,  WBC,  LACTICIDVEN Microbiology Recent Results (from the past 240 hour(s))  Body fluid culture (includes gram stain)     Status: None   Collection Time: 10/03/19  2:25 PM   Specimen: Pleural Fluid  Result Value Ref Range Status   Specimen Description FLUID PLEURAL LEFT  Final   Special Requests NONE  Final   Gram Stain   Final    WBC PRESENT,BOTH PMN AND MONONUCLEAR NO ORGANISMS SEEN CYTOSPIN  SMEAR    Culture   Final    NO GROWTH 3 DAYS Performed at [facility], [address] [place], [place] [address]    Report Status 10/06/2019 FINAL  Final  Body fluid culture     Status: None   Collection Time: 10/04/19 10:30  AM   Specimen: Peritoneal Dialysate; Body Fluid  Result Value Ref Range Status   Specimen Description PERITONEAL DIALYSATE  Final   Special Requests NONE  Final   Gram Stain   Final    ABUNDANT WBC PRESENT,BOTH PMN AND MONONUCLEAR NO ORGANISMS SEEN    Culture   Final    MODERATE SERRATIA MARCESCENS FEW ENTEROCOCCUS FAECALIS CRITICAL RESULT CALLED TO, READ BACK BY AND VERIFIED WITH: RN Howard Pouch [phone] AT 1038 BY CM Performed at [facility], [place] [address], [place], [place] [address]    Report Status 10/08/2019 FINAL  Final   Organism ID, Bacteria SERRATIA MARCESCENS  Final   Organism ID, Bacteria ENTEROCOCCUS FAECALIS  Final      Susceptibility   Enterococcus faecalis - MIC*    AMPICILLIN <=2 SENSITIVE Sensitive     VANCOMYCIN 1 SENSITIVE Sensitive     GENTAMICIN SYNERGY RESISTANT Resistant     * FEW ENTEROCOCCUS FAECALIS   Serratia marcescens - MIC*    CEFAZOLIN >=64 RESISTANT Resistant     CEFEPIME 0.5 SENSITIVE Sensitive     CEFTAZIDIME <=1 SENSITIVE Sensitive     CEFTRIAXONE 0.5 SENSITIVE Sensitive     CIPROFLOXACIN <=0.25 SENSITIVE Sensitive     GENTAMICIN <=1 SENSITIVE Sensitive     TRIMETH/SULFA <=20 SENSITIVE Sensitive     * MODERATE SERRATIA MARCESCENS  Surgical pcr screen     Status: None   Collection Time: 10/05/19  9:53 PM   Specimen: Nasal Mucosa; Nasal Swab  Result Value Ref Range Status   MRSA, PCR NEGATIVE NEGATIVE Final   Staphylococcus aureus NEGATIVE NEGATIVE Final    Comment: (NOTE) The Xpert SA Assay (FDA approved for NASAL specimens in patients 47 years of age and older), is one component of a comprehensive surveillance program. It is not intended to diagnose infection nor to guide or monitor  treatment. Performed at [facility], [place] [address] [place], [place] [address]      Time coordinating discharge:  3 9 minutes  SIGNED:   Georgette Shell, MD  [facility] 10/13/2019, 10:19 AM Pager   If 7PM-7AM, please contact night-coverage [email or web address] Password [email or web address]

## 2019-10-13 NOTE — Progress Notes (Signed)
Physical Therapy Treatment Patient Details Name: Aaron [name] MRN: [id number] DOB: [birth year] Today's Date: 10/13/2019    History of Present Illness Patient is a 64 y/o male admitted with syncope during HD with respiratory arrest. 6/24 pt with left femoral endarterectomy with iliac stent and left fem-pop BPG as well as ret femoral artery cannulation and stent.PMHx: pt with ESRD normally on peritoneal dialysist but has been on HD since SBO s/p ex lap 5/17 with repeat 5/24, discitis, HLD, CAD, PAD, A-fib with RVR, HTN, COPD, CHF.    PT Comments    Pt admitted with above diagnosis. Pt was able to ambulate with RW to door and back. REfused to go further due to fatigue. States he walks short distances at home.  Pt currently with functional limitations due to balance and endurance deficits. Pt will benefit from skilled PT to increase their independence and safety with mobility to allow discharge to the venue listed below.     Follow Up Recommendations  Home health PT;Supervision for mobility/OOB     Equipment Recommendations  None recommended by PT    Recommendations for Other Services       Precautions / Restrictions Precautions Precautions: Fall Precaution Comments: abdominal incision, wound bil hallux Required Braces or Orthoses: Other Brace Other Brace: abdominal binder, L postop shoe for LLE Restrictions Weight Bearing Restrictions: No    Mobility  Bed Mobility               General bed mobility comments: up in chair on arrival  Transfers Overall transfer level: Needs assistance Equipment used: Rolling walker (2 wheeled) Transfers: Sit to/from [facility] Sit to Stand: Min guard         General transfer comment: No assist needed just cues for hand placment and incr time for pt to rise to feet  Ambulation/Gait Ambulation/Gait assistance: Min guard Gait Distance (Feet): 45 Feet Assistive device: Rolling walker (2 wheeled) Gait Pattern/deviations:  Step-to pattern;Decreased stride length;Trunk flexed Gait velocity: Decreased Gait velocity interpretation: 1.31 - 2.62 ft/sec, indicative of limited community ambulator General Gait Details: cues for posture as pt flexes forward.  Needs cues to stay close to RW as well.    Stairs             Wheelchair Mobility    Modified Rankin (Stroke Patients Only)       Balance Overall balance assessment: Needs assistance Sitting-balance support: Feet supported;No upper extremity supported Sitting balance-Leahy Scale: Good     Standing balance support: Bilateral upper extremity supported Standing balance-Leahy Scale: Poor Standing balance comment: RW for UE support                             Cognition Arousal/Alertness: Awake/alert Behavior During Therapy: WFL for tasks assessed/performed Overall Cognitive Status: Within Functional Limits for tasks assessed                                        Exercises General Exercises - Lower Extremity Ankle Circles/Pumps: AROM;Both;10 reps Long Arc Quad: AROM;Both;15 reps;Seated Hip Flexion/Marching: AROM;Both;10 reps;Seated    General Comments General comments (skin integrity, edema, etc.): sats on RA >92%       Pertinent Vitals/Pain Pain Assessment: Faces Faces Pain Scale: Hurts little more Pain Location: LLE; ABD incision Pain Descriptors / Indicators: Heaviness;Discomfort;Sore Pain Intervention(s): Limited activity within patient's tolerance;Monitored during  session;Repositioned    Home Living                      Prior Function            PT Goals (current goals can now be found in the care plan section) Acute Rehab PT Goals Patient Stated Goal: return home Progress towards PT goals: Progressing toward goals    Frequency    Min 3X/week      PT Plan Current plan remains appropriate    Co-evaluation              AM-PAC PT "6 Clicks" Mobility   Outcome Measure   Help needed turning from your back to your side while in a flat bed without using bedrails?: None Help needed moving from lying on your back to sitting on the side of a flat bed without using bedrails?: None Help needed moving to and from a bed to a chair (including a wheelchair)?: A Little Help needed standing up from a chair using your arms (e.g., wheelchair or bedside chair)?: A Little Help needed to walk in hospital room?: A Little Help needed climbing 3-5 steps with a railing? : A Little 6 Click Score: 20    End of Session Equipment Utilized During Treatment: Gait belt Activity Tolerance: Patient tolerated treatment well Patient left: in chair;with call bell/phone within reach;with chair alarm set Nurse Communication: Mobility status PT Visit Diagnosis: Other abnormalities of gait and mobility (R26.89);Muscle weakness (generalized) (M62.81);Difficulty in walking, not elsewhere classified (R26.2);Pain Pain - Right/Left: Right Pain - part of body: Ankle and joints of foot     Time: 5396-7289 PT Time Calculation (min) (ACUTE ONLY): 15 min  Charges:  $Gait Training: 8-22 mins                     Aaron [name] W,PT [place] Pager:  [phone]  Office:  [place] 10/13/2019, 1:57 PM

## 2019-10-13 NOTE — Progress Notes (Signed)
Occupational Therapy Treatment Patient Details Name: Aaron [name] MRN: [id number] DOB: 1955/11/28 Today's Date: 10/13/2019    History of present illness Patient is a 64 y/o male admitted with syncope during HD with respiratory arrest. 6/24 pt with left femoral endarterectomy with iliac stent and left fem-pop BPG as well as ret femoral artery cannulation and stent.PMHx: pt with ESRD normally on peritoneal dialysist but has been on HD since SBO s/p ex lap 5/17 with repeat 5/24, discitis, HLD, CAD, PAD, A-fib with RVR, HTN, COPD, CHF.   OT comments  Pt received supine in bed reporting waiting on breakfast but agreeable to get OOB. Pt reports difficulty reaching BLEs d/t ABD incision. Education provided on all AE for LB dressing and bathing with pt reporting understanding from having used AE from previous surgeries. Overall, pt requires MIN A with RW for stand pivot transfers and MOD A for LB ADLs. Continue to recommend [facility] for DC. Will follow acutely per POC.   Follow Up Recommendations  Home health OT;Supervision/Assistance - 24 hour    Equipment Recommendations  Other (comment) (pt's DME needs are met)    Recommendations for Other Services      Precautions / Restrictions Precautions Precautions: Fall Precaution Comments: abdominal incision, wound bil hallux Restrictions Weight Bearing Restrictions: No LLE Weight Bearing: Weight bearing as tolerated       Mobility Bed Mobility Overal bed mobility: Needs Assistance Bed Mobility: Supine to Sit     Supine to sit: Supervision;HOB elevated     General bed mobility comments: supervision for safety with elevated HOB and use of rail  Transfers Overall transfer level: Needs assistance Equipment used: Rolling walker (2 wheeled) Transfers: Sit to/from [facility] Sit to Stand: Min assist;From elevated surface Stand pivot transfers: Min guard       General transfer comment: MIN A to sit<>stand from elevated surface;  MIN A for RW mgmt and balance during stand pivot from EOB>recliner    Balance Overall balance assessment: Needs assistance Sitting-balance support: Feet supported Sitting balance-Leahy Scale: Good     Standing balance support: Bilateral upper extremity supported Standing balance-Leahy Scale: Poor Standing balance comment: RW for UE support                            ADL either performed or assessed with clinical judgement   ADL Overall ADL's : Needs assistance/impaired Eating/Feeding: Modified independent;Sitting             Lower Body Bathing Details (indicate cue type and reason): educated pt on AE for LB bathing with use of LH sponge. pt with decreased ability to reach LLE d/t pain from incision on L thigh     Lower Body Dressing: Moderate assistance Lower Body Dressing Details (indicate cue type and reason): pt required MOD A for LB dressing from long sitting in bed. pt able to don sock on RLE but required assist for LLE d/t pain from incision. education provided on all LB AE.pt reports using AE previosuly Toilet Transfer: Minimal assistance;Stand-pivot Armed forces technical officer Details (indicate cue type and reason): simulated via functional mobility with stand pivot transfer from EOB>recliner; [place] with RW       Tub/Shower Transfer Details (indicate cue type and reason): pt reports shower seat and has already asked ex wife to come assist with bathing, plan to take sponge baths in between Functional mobility during ADLs: Minimal assistance;Rolling walker General ADL Comments: pt with decreased ability to reach BLEs  d/t ABD incision and LLE incision     Vision       Perception     Praxis      Cognition Arousal/Alertness: Awake/alert Behavior During Therapy: WFL for tasks assessed/performed Overall Cognitive Status: Within Functional Limits for tasks assessed                                          Exercises     Shoulder Instructions        General Comments pt on RA during session with O2 96% post mobility all other VSS. noted gauze on L toe, applied tape to gauze to secure dressing    Pertinent Vitals/ Pain       Pain Assessment: Faces Faces Pain Scale: Hurts little more Pain Location: LLE; ABD incision Pain Descriptors / Indicators: Heaviness;Discomfort;Sore Pain Intervention(s): Limited activity within patient's tolerance;Monitored during session;Repositioned  Home Living                                          Prior Functioning/Environment              Frequency  Min 2X/week        Progress Toward Goals  OT Goals(current goals can now be found in the care plan section)  Progress towards OT goals: Progressing toward goals  Acute Rehab OT Goals Patient Stated Goal: return home OT Goal Formulation: With patient Time For Goal Achievement: 10/24/19 Potential to Achieve Goals: Good  Plan Discharge plan remains appropriate;Frequency remains appropriate    Co-evaluation                 AM-PAC OT "6 Clicks" Daily Activity     Outcome Measure   Help from another person eating meals?: None Help from another person taking care of personal grooming?: A Little Help from another person toileting, which includes using toliet, bedpan, or urinal?: A Lot Help from another person bathing (including washing, rinsing, drying)?: Total Help from another person to put on and taking off regular upper body clothing?: A Little Help from another person to put on and taking off regular lower body clothing?: A Lot 6 Click Score: 15    End of Session Equipment Utilized During Treatment: Gait belt;Rolling walker;Other (comment) (AE)  OT Visit Diagnosis: Unsteadiness on feet (R26.81);Other abnormalities of gait and mobility (R26.89);Pain Pain - Right/Left: Left Pain - part of body: Leg;Ankle and joints of foot   Activity Tolerance Patient tolerated treatment well   Patient Left in  chair;with call bell/phone within reach;with chair alarm set   Nurse Communication Mobility status        Time: 7209-4709 OT Time Calculation (min): 20 min  Charges: OT General Charges $OT Visit: 1 Visit OT Treatments $Self Care/Home Management : 8-22 mins  Lanier Clam., COTA/L Acute Rehabilitation Services [phone]    Ihor Gully 10/13/2019, 9:46 AM

## 2019-10-13 NOTE — TOC Initial Note (Signed)
Transition of Care [facility]) - Initial/Assessment Note    Patient Details  Name: Aaron [name] MRN: [id number] Date of Birth: 03-May-1955  [facility] CM/SW Contact:    Zenon Mayo, RN Phone Number: 10/13/2019, 12:10 PM  Clinical Narrative:                 Patient is for dc, he will resume with [facility] for [place], [place], [place].  NCM notified Tiffany of the discharge.  He will have transportation and no issues with medications.   Expected Discharge Plan: [place] Barriers to Discharge: No Barriers Identified   Patient Goals and CMS Choice Patient states their goals for this hospitalization and ongoing recovery are:: get better CMS Medicare.gov Compare Post Acute Care list provided to:: Patient Choice offered to / list presented to : Patient  Expected Discharge Plan and Services Expected Discharge Plan: [place]   Discharge Planning Services: CM Consult Post Acute Care Choice: Resumption of Svcs/PTA Provider Living arrangements for the past 2 months: Single Family Home Expected Discharge Date: 10/13/19                 DME Agency: NA       HH Arranged: RN, PT, OT [place] Agency: [facility] [employer] Date [place]: 10/13/19 Time [place]: 1209 Representative spoke with at [place]: [place]  Prior Living Arrangements/Services Living arrangements for the past 2 months: [place] with:: Spouse Patient language and need for interpreter reviewed:: Yes Do you feel safe going back to the place where you live?: Yes      Need for Family Participation in Patient Care: Yes (Comment) Care giver support system in place?: Yes (comment)   Criminal Activity/Legal Involvement Pertinent to Current Situation/Hospitalization: No - Comment as needed  Activities of Daily Living Home Assistive Devices/Equipment: Cane (specify quad or straight), Eyeglasses, Shower chair with back ADL  Screening (condition at time of admission) Patient's cognitive ability adequate to safely complete daily activities?: Yes Is the patient deaf or have difficulty hearing?: No Does the patient have difficulty seeing, even when wearing glasses/contacts?: No Does the patient have difficulty concentrating, remembering, or making decisions?: No Patient able to express need for assistance with ADLs?: Yes Does the patient have difficulty dressing or bathing?: No Independently performs ADLs?: Yes (appropriate for developmental age) Does the patient have difficulty walking or climbing stairs?: Yes Weakness of Legs: Both Weakness of Arms/Hands: None  Permission Sought/Granted                  Emotional Assessment Appearance:: Appears stated age Attitude/Demeanor/Rapport: Engaged Affect (typically observed): Appropriate Orientation: : Oriented to Situation, Oriented to  Time, Oriented to Place, Oriented to Self Alcohol / Substance Use: Not Applicable Psych Involvement: No (comment)  Admission diagnosis:  Atrial fibrillation ([place]) [I48.91] Patient Active Problem List   Diagnosis Date Noted  . Peritonitis ([place]) 10/04/2019  . Ischemic ulcer of toe of right foot ([place]) 10/04/2019  . S/P thoracentesis   . Atrial fibrillation ([place]) 10/01/2019  . Hyperkalemia 09/20/2019  . Pain at surgical site   . Goals of care, counseling/discussion   . Palliative care encounter   . Atrial fibrillation with rapid ventricular response ([place])   . ESRD on peritoneal dialysis ([place])   . SBO (small bowel obstruction) ([place])   . DNR (do not resuscitate)   . Malnutrition of moderate degree 08/31/2019  . Acute exacerbation  of CHF (congestive heart failure) ([place]) 08/23/2019  . Atrial fibrillation with RVR ([place]) 08/23/2019  . Shortness of breath 08/22/2019  . Hyperglycemia 05/27/2019  . Syncope 05/26/2019  . Vertebral osteomyelitis ([place]) 04/02/2018  . Back pain 03/31/2018  . Noncompliance with medication regimen  03/31/2018  . CKD (chronic kidney disease) stage 5, GFR less than 15 ml/min (HCC) 02/03/2018  . COPD with acute exacerbation ([place]) 02/03/2018  . Atrial fibrillation, chronic ([place]) 02/03/2018  . Septic shock ([place]) 01/27/2018  . Discitis 01/27/2018  . Discitis of lumbar region 01/09/2018  . Paraspinal abscess ([place]) 01/08/2018  . ESRD on dialysis ([place]) 12/30/2017  . Hematoma of arm, right, initial encounter 11/23/2017  . Protein-calorie malnutrition, severe 10/15/2017  . Hyperlipidemia 08/24/2017  . Schatzki's ring of distal esophagus 04/27/2017  . Encounter for therapeutic drug monitoring 03/03/2017  . History of hepatitis C, naturally cleared  02/20/2017  . Ischemic cardiomyopathy 01/27/2017  . Dyslipidemia 01/27/2017  . AF (paroxysmal atrial fibrillation) ([place]) 01/27/2017  . CAD (coronary artery disease) 01/21/2017  . Polycystic kidney disease 12/14/2016  . Syncope, vasovagal 09/23/2014  . COPD (chronic obstructive pulmonary disease) ([place])   . COLD (chronic obstructive lung disease) ([place])   . Depression   . PAD (peripheral artery disease) ([place]) 09/19/2014  . Preop cardiovascular exam 10/28/2010  . Tobacco abuse 10/28/2010  . UNSPECIFIED IRON DEFICIENCY ANEMIA 10/04/2009  . 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  . Essential hypertension 10/04/2009  . Esophageal reflux 10/04/2009  . PRECORDIAL PAIN 10/04/2009   PCP:  Gwenlyn Saran [facility]:   [place], [place] [address] [place] [address] Phone: [phone] Fax: [phone]     Social Determinants of Health (SDOH) Interventions    Readmission Risk Interventions Readmission Risk Prevention Plan 10/13/2019 09/01/2019 12/08/2017  Transportation Screening Complete Complete Complete  PCP or Specialist Appt within 3-5 Days - Complete -  HRI or [place] - Complete -  Social Work Consult for [place] Planning/Counseling - Complete -  Palliative Care  Screening - Complete -  Medication Review Press photographer) Complete Complete Complete  PCP or Specialist appointment within 3-5 days of discharge Complete - -  [place] or Home Care Consult Complete - Complete  SW Recovery Care/Counseling Consult Complete - -  Palliative Care Screening Not Applicable - -  [place] Not Applicable - -  Some recent data might be hidden

## 2019-10-18 ENCOUNTER — Telehealth: Payer: Self-pay

## 2019-10-18 NOTE — Telephone Encounter (Signed)
Pt called wanting to know when he would be set up for [facility], OT and PT?  I called [facility] [phone].  Pt is scheduled for a visit on 10/20/19.    Pt is aware and verbalizes understanding.  Thurston Hole., LPN

## 2019-10-18 NOTE — Telephone Encounter (Signed)
Pt called with questions about L leg swelling. It is about as much as when he was d/c from hospital last week. He has mild pain at night but it is manageable and improving. I encouraged him to prop his L left up when resting. He will call us back if it does not improve over the next few days or if anything changes/worsens.

## 2019-10-24 ENCOUNTER — Ambulatory Visit: Payer: 59 | Admitting: Internal Medicine

## 2019-10-28 ENCOUNTER — Encounter: Payer: 59 | Admitting: Vascular Surgery

## 2019-10-28 ENCOUNTER — Telehealth: Payer: Self-pay | Admitting: *Deleted

## 2019-10-28 NOTE — Telephone Encounter (Signed)
RTC left from VM to April w/ [facility] Pt has not established care with [place] yet. He needs a referral to wound clinic in [facility] for his right great toe. She will call the wound clinic to see if they can get him seen or if his insurance needs a referral. Will call us back if he needs an earlier appt than what he has in August.

## 2019-11-01 ENCOUNTER — Other Ambulatory Visit: Payer: Self-pay

## 2019-11-01 ENCOUNTER — Encounter: Payer: Self-pay | Admitting: Nurse Practitioner

## 2019-11-01 ENCOUNTER — Ambulatory Visit (INDEPENDENT_AMBULATORY_CARE_PROVIDER_SITE_OTHER): Payer: 59 | Admitting: Nurse Practitioner

## 2019-11-01 VITALS — BP 107/72 | HR 125 | Temp 97.6°F

## 2019-11-01 DIAGNOSIS — L97511 Non-pressure chronic ulcer of other part of right foot limited to breakdown of skin: Secondary | ICD-10-CM

## 2019-11-01 DIAGNOSIS — R600 Localized edema: Secondary | ICD-10-CM | POA: Diagnosis not present

## 2019-11-01 DIAGNOSIS — L89302 Pressure ulcer of unspecified buttock, stage 2: Secondary | ICD-10-CM

## 2019-11-01 DIAGNOSIS — I10 Essential (primary) hypertension: Secondary | ICD-10-CM | POA: Diagnosis not present

## 2019-11-01 MED ORDER — SILVER SULFADIAZINE 1 % EX CREA: 1.0000 "application " | TOPICAL_CREAM | Freq: Every day | CUTANEOUS | 0 refills | Status: AC

## 2019-11-01 NOTE — Assessment & Plan Note (Signed)
Patient is a 64 year old male who is in clinic today for follow-up of hypertension.  Patient was diagnosed 10/04/2009.  Patient is tolerating medication well without side effects.  Compliance with treatment has been good including taking medication as directed.  Maintains a healthy diet and following up as directed.  No changes to medication dose at this time patient is on target. Patient knows to follow-up with elevated or on controlled blood pressures.

## 2019-11-01 NOTE — Patient Instructions (Signed)
Pressure Injury  A pressure injury is damage to the skin and underlying tissue that results from pressure being applied to an area of the body. It often affects people who must spend a long time in a bed or chair because of a medical condition. Pressure injuries usually occur:  Over bony parts of the body, such as the tailbone, shoulders, elbows, hips, heels, spine, ankles, and back of the head.  Under medical devices that make contact with the body, such as respiratory equipment, stockings, tubes, and splints. Pressure injuries start as reddened areas on the skin and can lead to pain and an open wound. What are the causes? This condition is caused by frequent or constant pressure to an area of the body. Decreased blood flow to the skin can eventually cause the skin tissue to die and break down, causing a wound. What increases the risk? You are more likely to develop this condition if you:  Are in the hospital or an extended care facility.  Are bedridden or in a wheelchair.  Have an injury or disease that keeps you from: ? Moving normally. ? Feeling pain or pressure.  Have a condition that: ? Makes you sleepy or less alert. ? Causes poor blood flow.  Need to wear a medical device.  Have poor control of your bladder or bowel functions (incontinence).  Have poor nutrition (malnutrition). If you are at risk for pressure injuries, your health care provider may recommend certain types of mattresses, mattress covers, pillows, cushions, or boots to help prevent them. These may include products filled with air, foam, gel, or sand. What are the signs or symptoms? Symptoms of this condition depend on the severity of the injury. Symptoms may include:  Red or dark areas of the skin.  Pain, warmth, or a change of skin texture.  Blisters.  An open wound. How is this diagnosed? This condition is diagnosed with a medical history and physical exam. You may also have tests, such as:  Blood  tests.  Imaging tests.  Blood flow tests. Your pressure injury will be staged based on its severity. Staging is based on:  The depth of the tissue injury, including whether there is exposure of muscle, bone, or tendon.  The cause of the pressure injury. How is this treated? This condition may be treated by:  Relieving or redistributing pressure on your skin. This includes: ? Frequently changing your position. ? Avoiding positions that caused the wound or that can make the wound worse. ? Using specific bed mattresses, chair cushions, or protective boots. ? Moving medical devices from an area of pressure, or placing padding between the skin and the device. ? Using foams, creams, or powders to prevent rubbing (friction) on the skin.  Keeping your skin clean and dry. This may include using a skin cleanser or skin barrier as told by your health care provider.  Cleaning your injury and removing any dead tissue from the wound (debridement).  Placing a bandage (dressing) over your injury.  Using medicines for pain or to prevent or treat infection. Surgery may be needed if other treatments are not working or if your injury is very deep. Follow these instructions at home: Wound care  Follow instructions from your health care provider about how to take care of your wound. Make sure you: ? Wash your hands with soap and water before and after you change your bandage (dressing). If soap and water are not available, use hand sanitizer. ? Change your dressing as told  by your health care provider.  Check your wound every day for signs of infection. Have a caregiver do this for you if you are not able. Check for: ? Redness, swelling, or increased pain. ? More fluid or blood. ? Warmth. ? Pus or a bad smell. Skin care  Keep your skin clean and dry. Gently pat your skin dry.  Do not rub or massage your skin.  You or a caregiver should check your skin every day for any changes in color or  any new blisters or sores (ulcers). Medicines  Take over-the-counter and prescription medicines only as told by your health care provider.  If you were prescribed an antibiotic medicine, take or apply it as told by your health care provider. Do not stop using the antibiotic even if your condition improves. Reducing and redistributing pressure  Do not lie or sit in one position for a long time. Move or change position every 1-2 hours, or as told by your health care provider.  Use pillows or cushions to reduce pressure. Ask your health care provider to recommend cushions or pads for you. General instructions   Eat a healthy diet that includes lots of protein.  Drink enough fluid to keep your urine pale yellow.  Be as active as you can every day. Ask your health care provider to suggest safe exercises or activities.  Do not abuse drugs or alcohol.  Do not use any products that contain nicotine or tobacco, such as cigarettes, e-cigarettes, and chewing tobacco. If you need help quitting, ask your health care provider.  Keep all follow-up visits as told by your health care provider. This is important. Contact a health care provider if:  You have: ? A fever or chills. ? Pain that is not helped by medicine. ? Any changes in skin color. ? New blisters or sores. ? Pus or a bad smell coming from your wound. ? Redness, swelling, or pain around your wound. ? More fluid or blood coming from your wound.  Your wound does not improve after 1-2 weeks of treatment. Summary  A pressure injury is damage to the skin and underlying tissue that results from pressure being applied to an area of the body.  Do not lie or sit in one position for a long time. Your health care provider may advise you to move or change position every 1-2 hours.  Follow instructions from your health care provider about how to take care of your wound.  Keep all follow-up visits as told by your health care provider. This  is important. This information is not intended to replace advice given to you by your health care provider. Make sure you discuss any questions you have with your health care provider. Document Revised: 10/28/2017 Document Reviewed: 10/28/2017 [employer] Patient Education  [place], Adult Taking care of your wound properly can help to prevent pain, infection, and scarring. It can also help your wound to heal more quickly. How to care for your wound Wound care      Follow instructions from your health care provider about how to take care of your wound. Make sure you: ? Wash your hands with soap and water before you change the bandage (dressing). If soap and water are not available, use hand sanitizer. ? Change your dressing as told by your health care provider. ? Leave stitches (sutures), skin glue, or adhesive strips in place. These skin closures may need to stay in place for 2 weeks or longer.  If adhesive strip edges start to loosen and curl up, you may trim the loose edges. Do not remove adhesive strips completely unless your health care provider tells you to do that.  Check your wound area every day for signs of infection. Check for: ? Redness, swelling, or pain. ? Fluid or blood. ? Warmth. ? Pus or a bad smell.  Ask your health care provider if you should clean the wound with mild soap and water. Doing this may include: ? Using a clean towel to pat the wound dry after cleaning it. Do not rub or scrub the wound. ? Applying a cream or ointment. Do this only as told by your health care provider. ? Covering the incision with a clean dressing.  Ask your health care provider when you can leave the wound uncovered.  Keep the dressing dry until your health care provider says it can be removed. Do not take baths, swim, use a hot tub, or do anything that would put the wound underwater until your health care provider approves. Ask your health care provider if you can take  showers. You may only be allowed to take sponge baths. Medicines   If you were prescribed an antibiotic medicine, cream, or ointment, take or use the antibiotic as told by your health care provider. Do not stop taking or using the antibiotic even if your condition improves.  Take over-the-counter and prescription medicines only as told by your health care provider. If you were prescribed pain medicine, take it 30 or more minutes before you do any wound care or as told by your health care provider. General instructions  Return to your normal activities as told by your health care provider. Ask your health care provider what activities are safe.  Do not scratch or pick at the wound.  Do not use any products that contain nicotine or tobacco, such as cigarettes and e-cigarettes. These may delay wound healing. If you need help quitting, ask your health care provider.  Keep all follow-up visits as told by your health care provider. This is important.  Eat a diet that includes protein, vitamin A, vitamin C, and other nutrient-rich foods to help the wound heal. ? Foods rich in protein include meat, dairy, beans, nuts, and other sources. ? Foods rich in vitamin A include carrots and dark green, leafy vegetables. ? Foods rich in vitamin C include citrus, tomatoes, and other fruits and vegetables. ? Nutrient-rich foods have protein, carbohydrates, fat, vitamins, or minerals. Eat a variety of healthy foods including vegetables, fruits, and whole grains. Contact a health care provider if:  You received a tetanus shot and you have swelling, severe pain, redness, or bleeding at the injection site.  Your pain is not controlled with medicine.  You have redness, swelling, or pain around the wound.  You have fluid or blood coming from the wound.  Your wound feels warm to the touch.  You have pus or a bad smell coming from the wound.  You have a fever or chills.  You are nauseous or you  vomit.  You are dizzy. Get help right away if:  You have a red streak going away from your wound.  The edges of the wound open up and separate.  Your wound is bleeding, and the bleeding does not stop with gentle pressure.  You have a rash.  You faint.  You have trouble breathing. Summary  Always wash your hands with soap and water before changing your bandage (dressing).  To  help with healing, eat foods that are rich in protein, vitamin A, vitamin C, and other nutrients.  Check your wound every day for signs of infection. Contact your health care provider if you suspect that your wound is infected. This information is not intended to replace advice given to you by your health care provider. Make sure you discuss any questions you have with your health care provider. Document Revised: 07/19/2018 Document Reviewed: 10/16/2015 Elsevier Patient Education  [place].

## 2019-11-01 NOTE — Progress Notes (Signed)
New Patient Office Visit  Subjective:  Patient ID: Aaron [name], male    DOB: April 29, 1955  Age: 64 y.o. MRN: [id number]  CC:  Chief Complaint  Patient presents with   Establish Care    est care, has [place] needs orders    HPI RYSON BACHA presents for establishing care, follow-up for ischemic ulcer of toe of right foot, left foot, left buttocks decubitus ulcer and bilateral lower extremity pitting edema.  This has been ongoing for a few months.  Patient's ex- wife who is a caregiver is requesting verbal orders for continuous wound dressing.  Patient is concerned about losing his toes due to infection.    Pt presents for follow up of hypertension. Patient was diagnosed in 10/04/2009. The patient is tolerating the medication well without side effects. Compliance with treatment has been good; including taking medication as directed , maintains a healthy diet, and following up as directed.  Past Medical History:  Diagnosis Date   Anxiety    Arthritis    knees , back , shoulders (10/12/2017)   Atrial fibrillation (HCC)    CAD (coronary artery disease)    Mild nonobstructive disease at cardiac catheterization 2002   Chronic back pain    "back of my neck; down thru my legs" (10/12/2017)   COPD (chronic obstructive pulmonary disease) (HCC)    Diverticulitis    Esophageal reflux    ESRD (end stage renal disease) on dialysis ([place])    "TTS; [place]" (11/23/2017)   Essential hypertension    Hepatitis C    states he no longer has this   History of kidney stones    History of syncope    Hyperlipidemia    Jerking 09/23/2014   Myocardial infarction ([place]) 02/2017   "light one" (10/12/2017)   Non-compliant behavior    non compliant with diaylsis per daughter   PAT (paroxysmal atrial tachycardia) ([place])    Peripheral arterial disease ([place])    Occluded left superficial femoral artery status post stent June 2016 - Dr. Trula Slade   Pneumonia 1961   Polycystic kidney, unspecified type     Syncope 09/2014   SYNCOPE 05/07/2010   Qualifier: Diagnosis of  By: Laurance Flatten RN, BSN, Jennifer      Past Surgical History:  Procedure Laterality Date   ABDOMINAL AORTOGRAM N/A 08/11/2017   Procedure: ABDOMINAL AORTOGRAM;  Surgeon: Serafina Mitchell, MD;  Location: [place] [facility];  Service: Cardiovascular;  Laterality: N/A;   ANTERIOR CERVICAL DECOMP/DISCECTOMY FUSION     AORTOGRAM N/A 10/06/2019   Procedure: AORTAGRAM WITH PLACEMENT OF VIABAHN 5MM  BY 10CM RIGHT SUPERFICIAL FEMORAL ARTERY STENT, LEFT EXTERNAL ILIAC ARTERY STENT USING A 8 X 59 VBX STENT, AND LEFT COMMON INTERNAL ILIAC  ARTERY STENT USING A 8MM BY 40MM INNOVA STENT;  Surgeon: Waynetta Sandy, MD;  Location: [facility];  Service: Vascular;  Laterality: N/A;   APPLICATION OF WOUND VAC Right 08/14/2017   Procedure: APPLICATION OF PREVENA INCISIONAL WOUND VAC RIGHT GROIN;  Surgeon: Serafina Mitchell, MD;  [facility];  Service: Vascular;  Laterality: Right;   AV FISTULA PLACEMENT Left 09/04/2017   Procedure: ARTERIOVENOUS (AV) FISTULA CREATION LEFT UPPER ARM;  Surgeon: Serafina Mitchell, MD;  Location: [place];  Service: Vascular;  Laterality: Left;   [place] Left 11/20/2017   Procedure: SECOND STAGE BASILIC VEIN TRANSPOSITION LEFT ARM;  Surgeon: Serafina Mitchell, MD;  Location: [place];  Service: Vascular;  Laterality: Left;   BIOPSY  12/17/2016   Procedure: BIOPSY;  Surgeon: Daneil Dolin, MD;  [facility];  Service: Gastroenterology;;  gastric colon   BIOPSY  04/29/2017   Procedure: BIOPSY;  Surgeon: Daneil Dolin, MD;  [facility];  Service: Endoscopy;;  duodenal biopsies   BUNIONECTOMY Bilateral    CENTRAL VENOUS CATHETER INSERTION Right 08/29/2019   Procedure: Attempted Insertion Central Line Adult;  Surgeon: Jesusita Oka, MD;  Location: [place];  Service: General;  Laterality: Right;   COLONOSCOPY  2008   Dr. Oneida Alar: rare sigmoid colon  diverticulosis, internal hemorrhoids.    COLONOSCOPY WITH PROPOFOL N/A 12/17/2016   dense left-sided diverticulosis, right colon ulcers s/p biopsy query occult NSAID use vs transient ischemia, not consistent with IBD. CMV stains negative.    ENDARTERECTOMY FEMORAL Right 08/14/2017   Procedure: RIGHT ILLIO-FEMORAL ENDARTERECTOMY;  Surgeon: Serafina Mitchell, MD;  Location: [place];  Service: Vascular;  Laterality: Right;   ENDARTERECTOMY FEMORAL Left 10/06/2019   Procedure: LEFT COMMON FEMORAL ENDARTERECTOMY WITH PATCH ANGIOPLASTY USING A  (0.3 BY 6IN.) HEMASHIELD FINESSE PATCH;  Surgeon: Waynetta Sandy, MD;  Location: [place];  Service: Vascular;  Laterality: Left;   ESOPHAGEAL DILATION  12/17/2016   EGD with mild Schatzki's ring s/p dilatation, small hiatal hernia, erosive gastropathy (negative H.pylori gastritis)   ESOPHAGOGASTRODUODENOSCOPY  2008   Dr. Oneida Alar: normal esophagus without Barrett's, antritis and duodenitis, path with H.pylori gastritis   ESOPHAGOGASTRODUODENOSCOPY (EGD) WITH PROPOFOL N/A 12/17/2016   Procedure: ESOPHAGOGASTRODUODENOSCOPY (EGD) WITH PROPOFOL;  Surgeon: Daneil Dolin, MD;  [facility];  Service: Gastroenterology;  Laterality: N/A;   ESOPHAGOGASTRODUODENOSCOPY (EGD) WITH PROPOFOL N/A 04/29/2017   Patchy erythema of gastric mucosa diffusely, extensive inflammatory changes in duodenum, geographic ulceration and mucosal edema present, encroaching somewhat on the lumen yet still widely patent, distal second portion of duodenum appeared abnormal, path with peptic duodenitis with ulceration   FEMORAL-POPLITEAL BYPASS GRAFT Left 10/06/2019   Procedure: LEFT COMMON FEMORAL TO BELOW KNEE POPLITEAL ARTERY BYPASS GRAFT USING A 6MM BY 80CM PROPATEN GRAFT;  Surgeon: Waynetta Sandy, MD;  Location: [place];  Service: Vascular;  Laterality: Left;   GROIN DEBRIDEMENT Right 10/14/2017   Procedure: RIGHT GROIN AND RIGHT LOWER QUADRANT ABDOMEN DEBRIDEMENT WITH  PLACEMENT OF ANTIBIOTIC BEADS;  Surgeon: Serafina Mitchell, MD;  Location: [place];  Service: Vascular;  Laterality: Right;   HEMATOMA EVACUATION Left 12/06/2017   Procedure: EVACUATION HEMATOMA;  Surgeon: Waynetta Sandy, MD;  Location: [place];  Service: Vascular;  Laterality: Left;   HERNIA REPAIR  8099   umbilical   I & D EXTREMITY Right 09/21/2017   Procedure: IRRIGATION AND DEBRIDEMENT GROIN;  Surgeon: Elam Dutch, MD;  Location: [facility];  Service: Vascular;  Laterality: Right;   I & D EXTREMITY Left 11/23/2017   Procedure: Evacuation Hematoma LEFT UPPER ARM GRAFT;  Surgeon: Elam Dutch, MD;  Location: [place];  Service: Vascular;  Laterality: Left;   INGUINAL HERNIA REPAIR Right 02/2017   [place]   INSERTION OF DIALYSIS CATHETER Right 09/04/2017   Procedure: INSERTION OF TUNNELED  DIALYSIS CATHETER - RIGHT INTERNAL JUGULAR PLACEMENT;  Surgeon: Serafina Mitchell, MD;  Location: [place];  Service: Vascular;  Laterality: Right;   INSERTION OF DIALYSIS CATHETER Right 07/26/2018   Procedure: INSERTION OF DIALYSIS CATHETER RIGHT INTERNAL JUGULAR;  Surgeon: Marty Heck, MD;  Location: [place];  Service: Vascular;  Laterality: Right;   INSERTION  OF ILIAC STENT Right 08/14/2017   Procedure: INSERTION OF RIGHT COMMON ILIAC STENT 73mm x 50mm x 130cm INSERTION OF RIGHT EXTERNAL ILIAC STENT 42mm x 44mm x 130cm INSERTION OF SUPERFICIAL FERMORAL ARTERY STENT 30mm x 67mm x 130cm;  Surgeon: Serafina Mitchell, MD;  Location: [facility] OR;  Service: Vascular;  Laterality: Right;   IR FLUORO GUIDE CV LINE LEFT  08/31/2019   IR FLUORO GUIDE CV LINE RIGHT  08/31/2017   IR FLUORO GUIDE CV LINE RIGHT  08/30/2019   IR REMOVAL TUN CV CATH W/O FL  05/28/2018   IR REMOVAL TUN CV CATH W/O FL  03/07/2019   IR US GUIDE VASC ACCESS LEFT  08/31/2019   IR US GUIDE VASC ACCESS RIGHT  08/31/2017   IR US GUIDE VASC ACCESS RIGHT  08/30/2019   IRRIGATION AND DEBRIDEMENT ABDOMEN  09/05/2019   Procedure: Irrigation  And Debridement Abdomen, Placement of retention sutures;  Surgeon: Donnie Mesa, MD;  Location: [place];  Service: General;;   LAPAROTOMY N/A 08/29/2019   Procedure: EXPLORATORY LAPAROTOMY, REPAIR OF SEROSAL INJURY TIMES THREE;  Surgeon: Jesusita Oka, MD;  Location: [place];  Service: General;  Laterality: N/A;   LAPAROTOMY N/A 09/05/2019   Procedure: EXPLORATORY LAPAROTOMY;  Surgeon: Donnie Mesa, MD;  Location: [place];  Service: General;  Laterality: N/A;   LOWER EXTREMITY ANGIOGRAPHY Right 08/11/2017   Procedure: LOWER EXTREMITY ANGIOGRAPHY;  Surgeon: Serafina Mitchell, MD;  Location: [place] [facility];  Service: Cardiovascular;  Laterality: Right;   LYSIS OF ADHESION  08/29/2019   Procedure: Lysis Of Adhesion;  Surgeon: Jesusita Oka, MD;  Location: [place];  Service: General;;   PATCH ANGIOPLASTY Right 08/14/2017   Procedure: PATCH ANGIOPLASTY USING HEMASHIELD PATCH 0.3IN Lillie [place];  Surgeon: Serafina Mitchell, MD;  Location: [place];  Service: Vascular;  Laterality: Right;   PERIPHERAL VASCULAR CATHETERIZATION N/A 09/20/2014   Procedure: Abdominal Aortogram;  Surgeon: Serafina Mitchell, MD;  Location: [place] [facility];  Service: Cardiovascular;  Laterality: N/A;   POSTERIOR FUSION LUMBAR SPINE     TEE WITHOUT CARDIOVERSION N/A 01/12/2018   Procedure: TRANSESOPHAGEAL ECHOCARDIOGRAM (TEE);  Surgeon: Lelon Perla, MD;  Location: [facility];  Service: Cardiovascular;  Laterality: N/A;    Family History  Problem Relation Age of Onset   Alcoholism Mother    Heart disease Father        Massive heart attack   Heart attack Father    Atrial fibrillation Father    Colon cancer Father    Colon cancer Maternal Grandfather 36   Alcoholism Maternal Grandfather    Renal cancer Cousin    Ovarian cancer Sister     Social History   Socioeconomic History   Marital status: Legally Separated    Spouse name: Not on file   Number of children: 2   Years of education: Not on file    Highest education level: Not on file  Occupational History   Occupation: DISABLED  Tobacco Use   Smoking status: Current Every Day Smoker    Packs/day: 0.25    Years: 47.00    Pack years: 11.75    Types: Cigarettes    Last attempt to quit: 08/03/2017    Years since quitting: 2.2   Smokeless tobacco: Never Used  Vaping Use   Vaping Use: Never used  Substance and Sexual Activity   Alcohol use: Not Currently    Comment: 10/12/2017 alcohol free since 2017,  heavy drinker in the past  Drug use: Not Currently    Comment: "<2003 whatever was around; nothing since"   Sexual activity: Not Currently  Other Topics Concern   Not on file  Social History Narrative   Disabled from Back since 2007   Social Determinants of Health   Financial Resource Strain:    Difficulty of Paying Living Expenses:   Food Insecurity:    Worried About Charity fundraiser in the Last Year:    Arboriculturist in the Last Year:   Transportation Needs:    Film/video editor (Medical):    Lack of Transportation (Non-Medical):   Physical Activity:    Days of Exercise per Week:    Minutes of Exercise per Session:   Stress:    Feeling of Stress :   Social Connections:    Frequency of Communication with Friends and Family:    Frequency of Social Gatherings with Friends and Family:    Attends Religious Services:    Active Member of Clubs or Organizations:    Attends Music therapist:    Marital Status:   Intimate Partner Violence:    Fear of Current or Ex-Partner:    Emotionally Abused:    Physically Abused:    Sexually Abused:     ROS Review of Systems  Constitutional: Negative.   HENT: Negative.   Eyes: Negative.   Respiratory: Negative.   Cardiovascular: Positive for leg swelling. Negative for chest pain and palpitations.  Gastrointestinal: Positive for abdominal distention. Negative for nausea and vomiting.  Genitourinary: Negative.   Musculoskeletal:  Positive for myalgias.       Wheel chair  Skin: Positive for wound. Negative for rash.  Neurological: Positive for weakness.    Objective:   Today's Vitals: BP 107/72    Pulse (!) 125    Temp 97.6 F (36.4 C) (Temporal)   Physical Exam Constitutional:      Appearance: Normal appearance.  HENT:     Head: Normocephalic.  Eyes:     Conjunctiva/sclera: Conjunctivae normal.  Cardiovascular:     Rate and Rhythm: Normal rate and regular rhythm.     Pulses: Normal pulses.     Heart sounds: Normal heart sounds.  Pulmonary:     Effort: Pulmonary effort is normal.     Breath sounds: Normal breath sounds.  Abdominal:     General: Bowel sounds are normal. There is distension.  Musculoskeletal:        General: Swelling and tenderness present.     Cervical back: Normal range of motion.     Right lower leg: Edema present.     Left lower leg: Edema present.  Skin:    General: Skin is warm.     Findings: No erythema.  Neurological:     Mental Status: He is alert and oriented to person, place, and time.     Motor: Weakness present.  Psychiatric:        Mood and Affect: Mood normal.     Assessment & Plan:   Problem List Items Addressed This Visit      Cardiovascular and Mediastinum   Essential hypertension - Primary    Patient is a 64 year old male who is in clinic today for follow-up of hypertension.  Patient was diagnosed 10/04/2009.  Patient is tolerating medication well without side effects.  Compliance with treatment has been good including taking medication as directed.  Maintains a healthy diet and following up as directed.  No changes to medication dose at this  time patient is on target. Patient knows to follow-up with elevated or on controlled blood pressures.        Musculoskeletal and Integument   Ischemic ulcer of toe of right foot [facility])    Patient is a 64 year old male who presents to clinic with ischemic ulcer of toe of right foot.  And left foot.  Patient also has  left decubitus ulcer on his left buttocks.  Bilateral lower extremity pitting edema.  Patient's foot is dressed at home by ex-wife who is the primary caregiver.  She is asking for orders for continuous wound dressing.  Referral to wound care completed.  Silvadene ordered, compression socks ordered. Provided education on the signs and symptoms of infection with printed handout given to patient. Rx sent to pharmacy.       Relevant Orders   AMB referral to wound care center    Other Visit Diagnoses    Pressure injury of buttock, stage 2, unspecified laterality (HCC)       Relevant Medications   silver sulfADIAZINE (SILVADENE) 1 % cream   Localized edema       Relevant Orders   Compression stockings      Outpatient Encounter Medications as of 11/01/2019  Medication Sig   albuterol (PROVENTIL HFA;VENTOLIN HFA) 108 (90 Base) MCG/ACT inhaler Inhale 2 puffs into the lungs every 6 (six) hours as needed for wheezing or shortness of breath.   albuterol (PROVENTIL) (2.5 MG/3ML) 0.083% nebulizer solution Inhale 3 mLs into the lungs 3 (three) times daily as needed for shortness of breath or wheezing.   apixaban (ELIQUIS) 5 MG TABS tablet Take 1 tablet (5 mg total) by mouth 2 (two) times daily.   atorvastatin (LIPITOR) 80 MG tablet Take 80 mg by mouth daily.   BREO ELLIPTA 200-25 MCG/INH AEPB Inhale 1 puff into the lungs daily as needed (for respiratory issues.).    calcium acetate (PHOSLO) 667 MG capsule Take 667 mg by mouth 3 (three) times daily with meals.   clopidogrel (PLAVIX) 75 MG tablet Take 1 tablet (75 mg total) by mouth daily.   Darbepoetin Alfa (ARANESP) 60 MCG/0.3ML SOSY injection Inject 0.3 mLs (60 mcg total) into the vein every Wednesday with hemodialysis.   diltiazem (CARDIZEM CD) 180 MG 24 hr capsule Take 1 capsule (180 mg total) by mouth daily.   docusate sodium (COLACE) 100 MG capsule Take 1 capsule (100 mg total) by mouth daily.   gabapentin (NEURONTIN) 300 MG capsule  Take 1 capsule (300 mg total) by mouth at bedtime.   midodrine (PROAMATINE) 10 MG tablet Take 1 tablet (10 mg total) by mouth 3 (three) times daily with meals.   oxyCODONE-acetaminophen (PERCOCET/ROXICET) 5-325 MG tablet Take 1.5 tablets by mouth every 4 (four) hours as needed for moderate pain or severe pain.   pantoprazole (PROTONIX) 40 MG tablet Take 1 tablet (40 mg total) by mouth 2 (two) times daily. (Patient taking differently: Take 40 mg by mouth daily. )   polyethylene glycol (MIRALAX / GLYCOLAX) 17 g packet Take 17 g by mouth daily as needed for moderate constipation.   varenicline (CHANTIX) 0.5 MG tablet Take 0.5 mg by mouth 2 (two) times daily.   multivitamin (RENA-VIT) TABS tablet Take 1 tablet by mouth at bedtime.   ondansetron (ZOFRAN) 4 MG tablet Take 1 tablet (4 mg total) by mouth every 8 (eight) hours as needed for nausea.   senna-docusate (SENOKOT-S) 8.6-50 MG tablet Take 1 tablet by mouth 2 (two) times daily as needed for moderate  constipation.   vancomycin (VANCOCIN) 1-5 GM/200ML-% SOLN Inject 200 mLs (1,000 mg total) into the vein every Monday, Wednesday, and Friday with hemodialysis.   [DISCONTINUED] levofloxacin (LEVAQUIN) 250 MG tablet Take 1 tablet (250 mg total) by mouth daily at 6 PM. Please provide sufficient quantity to treat through 7/12   No facility-administered encounter medications on file as of 11/01/2019.    Follow-up: Return if symptoms worsen or fail to improve.   Ivy Lynn, NP

## 2019-11-01 NOTE — Assessment & Plan Note (Addendum)
Patient is a 64 year old male who presents to clinic with ischemic ulcer of toe of right foot.  And left foot.  Patient also has left decubitus ulcer on his left buttocks.  Bilateral lower extremity pitting edema.  Patient's foot is dressed at home by ex-wife who is the primary caregiver.  She is asking for orders for continuous wound dressing.  Referral to wound care completed.  Silvadene ordered, compression socks ordered. Provided education on the signs and symptoms of infection with printed handout given to patient. Rx sent to pharmacy.

## 2019-11-04 ENCOUNTER — Telehealth: Payer: Self-pay | Admitting: *Deleted

## 2019-11-04 ENCOUNTER — Telehealth: Payer: Self-pay | Admitting: Family Medicine

## 2019-11-04 NOTE — Telephone Encounter (Signed)
Can we please reach out to patient and make sure he is not suicidal? I have never met this patient; he has only ever seen [facility]. As long as he is not suicidal, it sounds like he needs an appointment to address some depression.

## 2019-11-04 NOTE — Telephone Encounter (Signed)
Can we please schedule patient for visit. I have never even met him.  

## 2019-11-04 NOTE — Telephone Encounter (Signed)
TC from [facility] nurse Pt has rash in groin area & folds of abdomen Wanting to know if nystatin powder could be called to [facility] Please let patient know if this done

## 2019-11-07 ENCOUNTER — Telehealth: Payer: Self-pay | Admitting: Family Medicine

## 2019-11-07 NOTE — Telephone Encounter (Signed)
Pt just stated he he was not interested in therapy and was not going to Dialysis anymore. There was a palliative care nurse present when PT was there. Pt was matter of fact and was at peace that he was ready to 'just go'. He has been transitioned to Hospice.

## 2019-11-09 ENCOUNTER — Ambulatory Visit: Payer: 59 | Admitting: Internal Medicine

## 2019-11-11 ENCOUNTER — Encounter: Payer: 59 | Admitting: Vascular Surgery

## 2019-11-11 NOTE — Telephone Encounter (Signed)
Will close encounter, see call from 11/07/19 about pt transitioning to Hospice/palliative care.

## 2019-11-13 DEATH — deceased

## 2019-11-29 IMAGING — DX DG CHEST 2V
2 series · 3 of 3 positions shown · non-contrast
Comparison: 01/09/2018, 12/04/2017

CLINICAL DATA: Shortness of breath

EXAM:
CHEST - 2 VIEW

[chest lat]
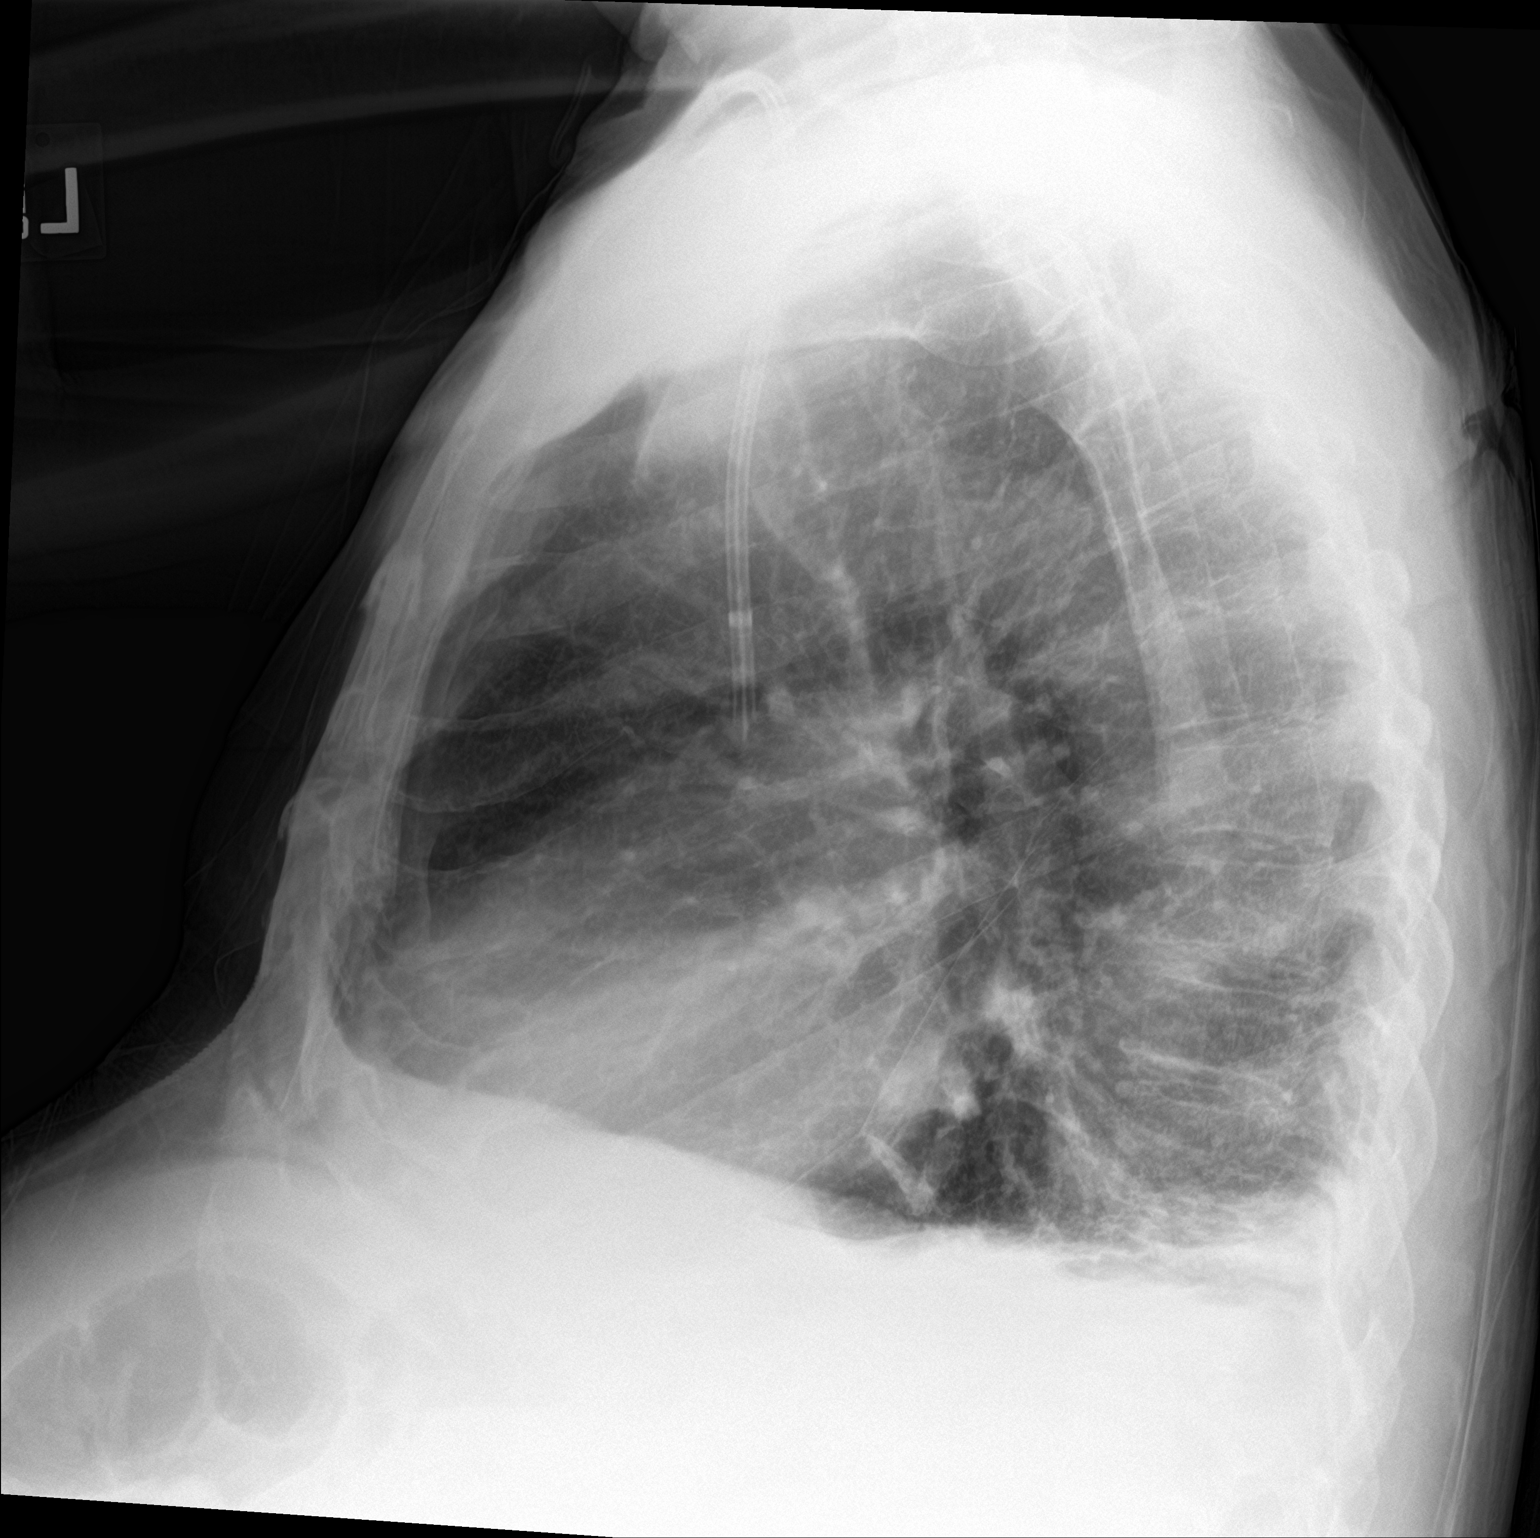

[Series 3: chest ap · 0.14mm/px · 2 of 2 slices shown]
[im 1/2]
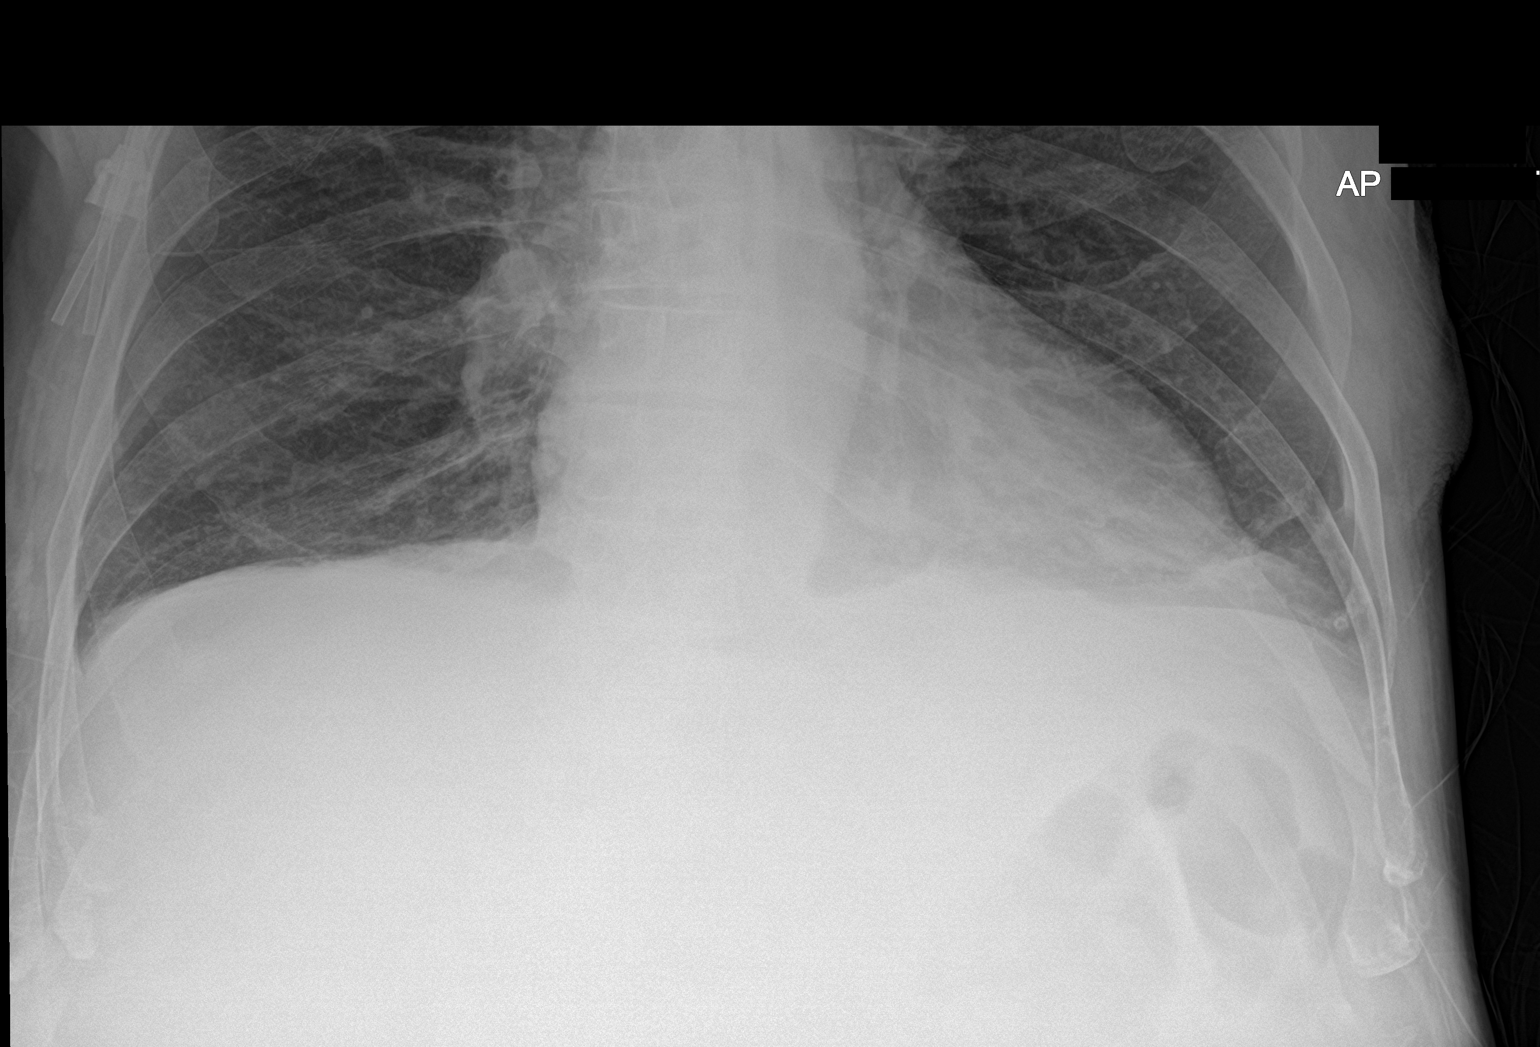
[im 2/2]
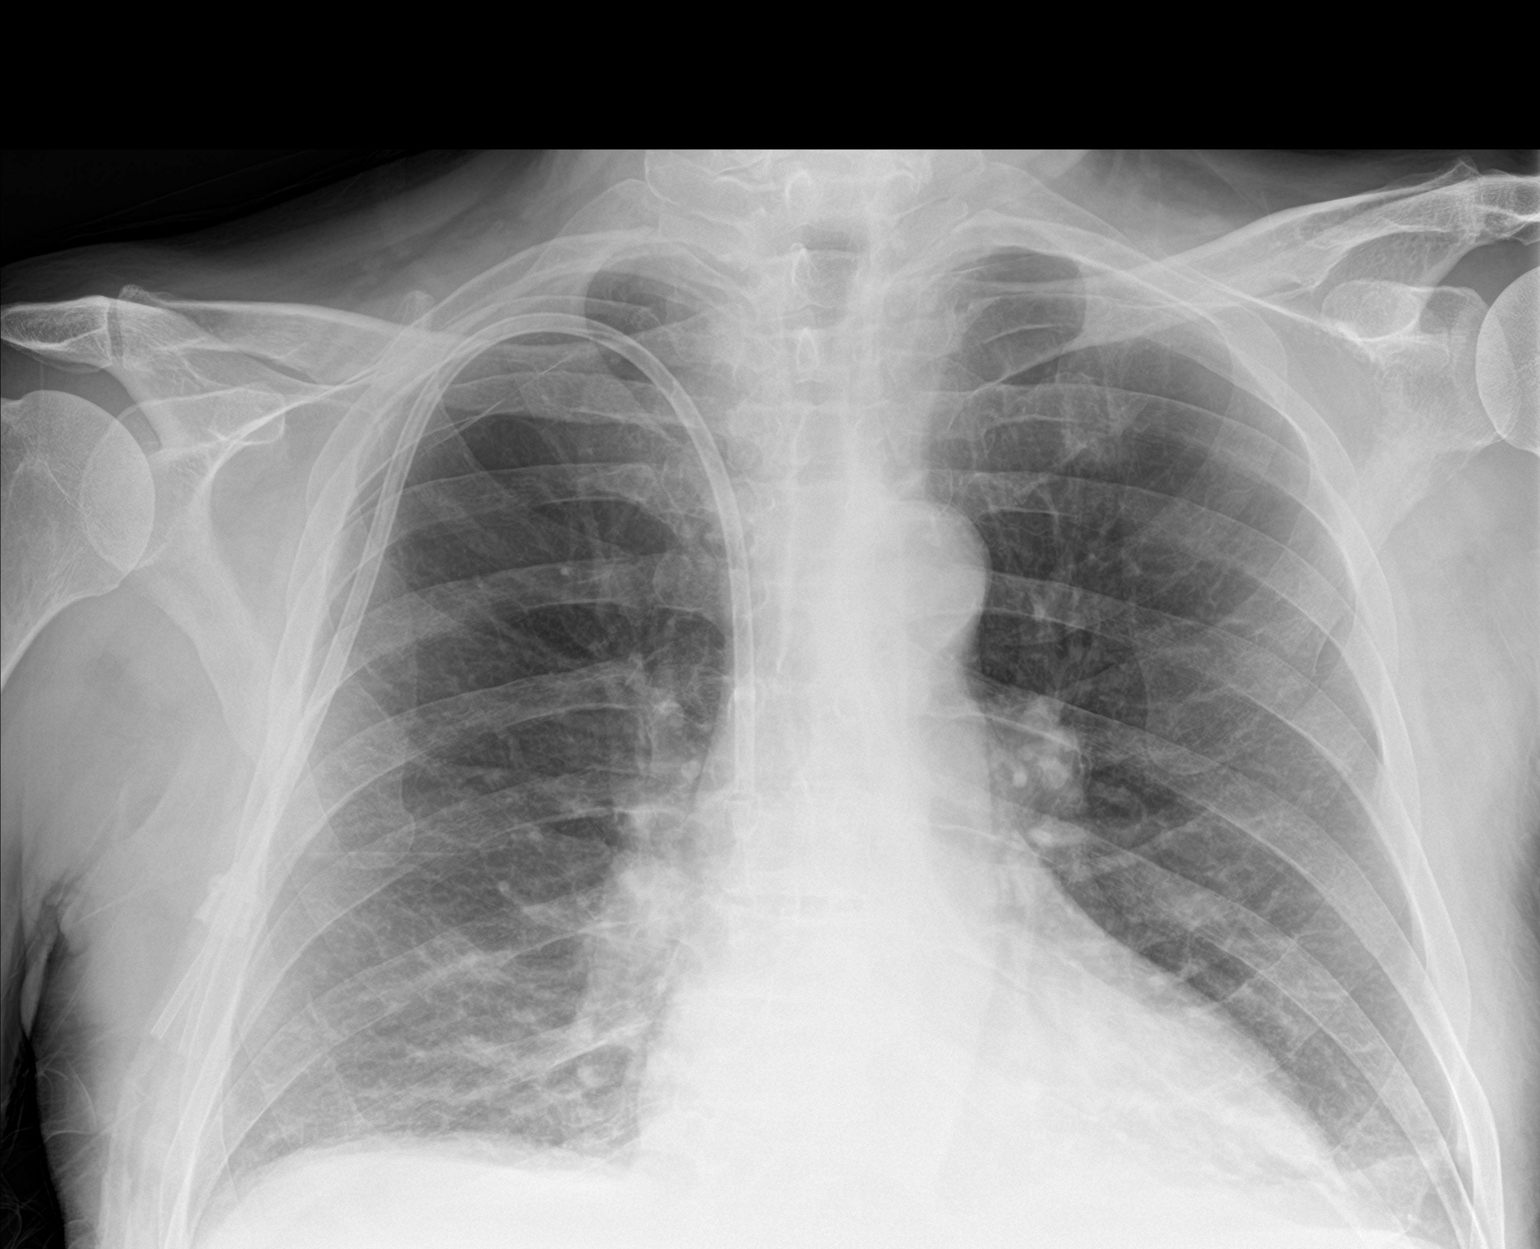

[3 of 3 positions shown; findings below may reference images not displayed]

FINDINGS: Right-sided central venous catheter tip over the SVC. Bibasilar
atelectasis or scarring. No pleural effusion. No focal
consolidation. Stable cardiomediastinal silhouette with aortic
atherosclerosis. No pneumothorax.
IMPRESSION: No active cardiopulmonary disease. Stable scarring and/or
atelectasis at both lung bases.

## 2019-12-08 ENCOUNTER — Ambulatory Visit: Payer: 59 | Admitting: Family Medicine
